# Patient Record
Sex: Female | Born: 1945 | Race: White | Hispanic: No | Marital: Married | Smoking: Current every day smoker
Health system: Southern US, Community
[De-identification: ages and names within clinical notes are randomized; demographics above are authoritative.]

## PROBLEM LIST (undated history)

## (undated) DIAGNOSIS — K219 Gastro-esophageal reflux disease without esophagitis: Secondary | ICD-10-CM

## (undated) DIAGNOSIS — D71 Functional disorders of polymorphonuclear neutrophils: Secondary | ICD-10-CM

## (undated) DIAGNOSIS — F419 Anxiety disorder, unspecified: Secondary | ICD-10-CM

## (undated) DIAGNOSIS — I5189 Other ill-defined heart diseases: Secondary | ICD-10-CM

## (undated) DIAGNOSIS — N200 Calculus of kidney: Secondary | ICD-10-CM

## (undated) DIAGNOSIS — Z8489 Family history of other specified conditions: Secondary | ICD-10-CM

## (undated) DIAGNOSIS — M1990 Unspecified osteoarthritis, unspecified site: Secondary | ICD-10-CM

## (undated) DIAGNOSIS — J410 Simple chronic bronchitis: Secondary | ICD-10-CM

## (undated) DIAGNOSIS — D719 Functional disorders of polymorphonuclear neutrophils, unspecified: Secondary | ICD-10-CM

## (undated) DIAGNOSIS — J449 Chronic obstructive pulmonary disease, unspecified: Secondary | ICD-10-CM

## (undated) DIAGNOSIS — I639 Cerebral infarction, unspecified: Secondary | ICD-10-CM

## (undated) DIAGNOSIS — H269 Unspecified cataract: Secondary | ICD-10-CM

## (undated) DIAGNOSIS — F410 Panic disorder [episodic paroxysmal anxiety] without agoraphobia: Secondary | ICD-10-CM

## (undated) DIAGNOSIS — D649 Anemia, unspecified: Secondary | ICD-10-CM

## (undated) DIAGNOSIS — I493 Ventricular premature depolarization: Secondary | ICD-10-CM

## (undated) DIAGNOSIS — M4800 Spinal stenosis, site unspecified: Secondary | ICD-10-CM

## (undated) DIAGNOSIS — J42 Unspecified chronic bronchitis: Secondary | ICD-10-CM

## (undated) DIAGNOSIS — E669 Obesity, unspecified: Secondary | ICD-10-CM

## (undated) DIAGNOSIS — N183 Chronic kidney disease, stage 3 unspecified: Secondary | ICD-10-CM

## (undated) DIAGNOSIS — I10 Essential (primary) hypertension: Secondary | ICD-10-CM

## (undated) DIAGNOSIS — R809 Proteinuria, unspecified: Secondary | ICD-10-CM

## (undated) DIAGNOSIS — E785 Hyperlipidemia, unspecified: Secondary | ICD-10-CM

## (undated) DIAGNOSIS — I2510 Atherosclerotic heart disease of native coronary artery without angina pectoris: Secondary | ICD-10-CM

## (undated) DIAGNOSIS — Z9289 Personal history of other medical treatment: Secondary | ICD-10-CM

## (undated) DIAGNOSIS — E049 Nontoxic goiter, unspecified: Secondary | ICD-10-CM

## (undated) DIAGNOSIS — Z720 Tobacco use: Secondary | ICD-10-CM

## (undated) DIAGNOSIS — R0902 Hypoxemia: Secondary | ICD-10-CM

## (undated) DIAGNOSIS — K469 Unspecified abdominal hernia without obstruction or gangrene: Secondary | ICD-10-CM

## (undated) HISTORY — PX: EYE SURGERY: SHX253

## (undated) HISTORY — DX: Functional disorders of polymorphonuclear neutrophils: D71

## (undated) HISTORY — DX: Other ill-defined heart diseases: I51.89

## (undated) HISTORY — PX: OTHER SURGICAL HISTORY: SHX169

## (undated) HISTORY — DX: Atherosclerotic heart disease of native coronary artery without angina pectoris: I25.10

## (undated) HISTORY — DX: Calculus of kidney: N20.0

## (undated) HISTORY — DX: Proteinuria, unspecified: R80.9

## (undated) HISTORY — DX: Hyperlipidemia, unspecified: E78.5

## (undated) HISTORY — DX: Unspecified abdominal hernia without obstruction or gangrene: K46.9

## (undated) HISTORY — DX: Obesity, unspecified: E66.9

## (undated) HISTORY — DX: Essential (primary) hypertension: I10

## (undated) HISTORY — DX: Unspecified osteoarthritis, unspecified site: M19.90

## (undated) HISTORY — DX: Unspecified chronic bronchitis: J42

## (undated) HISTORY — DX: Spinal stenosis, site unspecified: M48.00

## (undated) HISTORY — DX: Hypoxemia: R09.02

## (undated) HISTORY — DX: Unspecified cataract: H26.9

## (undated) HISTORY — DX: Functional disorders of polymorphonuclear neutrophils, unspecified: D71.9

## (undated) HISTORY — DX: Gastro-esophageal reflux disease without esophagitis: K21.9

## (undated) HISTORY — DX: Nontoxic goiter, unspecified: E04.9

## (undated) HISTORY — DX: Cerebral infarction, unspecified: I63.9

## (undated) HISTORY — PX: ABDOMINAL HYSTERECTOMY: SHX81

## (undated) HISTORY — PX: BREAST SURGERY: SHX581

## (undated) HISTORY — DX: Family history of other specified conditions: Z84.89

---

## 2004-06-09 ENCOUNTER — Ambulatory Visit: Payer: Self-pay | Admitting: Unknown Physician Specialty

## 2005-07-07 ENCOUNTER — Ambulatory Visit: Payer: Self-pay | Admitting: Unknown Physician Specialty

## 2005-08-03 ENCOUNTER — Ambulatory Visit: Payer: Self-pay | Admitting: Family Medicine

## 2006-02-21 HISTORY — PX: CORONARY ANGIOPLASTY WITH STENT PLACEMENT: SHX49

## 2006-03-20 ENCOUNTER — Inpatient Hospital Stay: Payer: Self-pay | Admitting: Cardiology

## 2006-03-20 ENCOUNTER — Other Ambulatory Visit: Payer: Self-pay

## 2006-03-21 ENCOUNTER — Other Ambulatory Visit: Payer: Self-pay

## 2006-07-31 ENCOUNTER — Ambulatory Visit: Payer: Self-pay | Admitting: Unknown Physician Specialty

## 2006-10-30 ENCOUNTER — Encounter: Admission: RE | Admit: 2006-10-30 | Discharge: 2006-10-30 | Payer: Self-pay | Admitting: Internal Medicine

## 2007-06-15 ENCOUNTER — Ambulatory Visit: Payer: Self-pay | Admitting: Family Medicine

## 2007-12-18 ENCOUNTER — Ambulatory Visit: Payer: Self-pay | Admitting: Unknown Physician Specialty

## 2008-12-29 ENCOUNTER — Encounter: Payer: Self-pay | Admitting: Cardiovascular Disease

## 2008-12-30 ENCOUNTER — Encounter: Payer: Self-pay | Admitting: Cardiovascular Disease

## 2009-01-07 ENCOUNTER — Ambulatory Visit: Payer: Self-pay | Admitting: Unknown Physician Specialty

## 2009-03-25 ENCOUNTER — Ambulatory Visit: Payer: Self-pay | Admitting: Cardiovascular Disease

## 2009-03-25 DIAGNOSIS — E1159 Type 2 diabetes mellitus with other circulatory complications: Secondary | ICD-10-CM | POA: Insufficient documentation

## 2009-03-25 DIAGNOSIS — I2510 Atherosclerotic heart disease of native coronary artery without angina pectoris: Secondary | ICD-10-CM | POA: Insufficient documentation

## 2009-03-25 DIAGNOSIS — E7849 Other hyperlipidemia: Secondary | ICD-10-CM | POA: Insufficient documentation

## 2009-03-25 DIAGNOSIS — E785 Hyperlipidemia, unspecified: Secondary | ICD-10-CM | POA: Insufficient documentation

## 2009-03-25 DIAGNOSIS — I10 Essential (primary) hypertension: Secondary | ICD-10-CM | POA: Insufficient documentation

## 2009-04-07 ENCOUNTER — Encounter: Payer: Self-pay | Admitting: Cardiovascular Disease

## 2009-04-09 ENCOUNTER — Encounter: Payer: Self-pay | Admitting: Cardiovascular Disease

## 2009-04-20 ENCOUNTER — Telehealth: Payer: Self-pay | Admitting: Cardiovascular Disease

## 2009-04-21 ENCOUNTER — Encounter: Payer: Self-pay | Admitting: Cardiovascular Disease

## 2009-06-30 ENCOUNTER — Ambulatory Visit: Payer: Self-pay | Admitting: Cardiovascular Disease

## 2009-08-03 ENCOUNTER — Ambulatory Visit: Payer: Self-pay | Admitting: Family Medicine

## 2010-01-05 ENCOUNTER — Ambulatory Visit: Payer: Self-pay | Admitting: Cardiovascular Disease

## 2010-01-19 ENCOUNTER — Ambulatory Visit: Payer: Self-pay | Admitting: Unknown Physician Specialty

## 2010-01-27 ENCOUNTER — Ambulatory Visit: Payer: Self-pay | Admitting: Unknown Physician Specialty

## 2010-03-23 ENCOUNTER — Telehealth: Payer: Self-pay | Admitting: Cardiovascular Disease

## 2010-03-23 NOTE — Letter (Signed)
Summary: Medical Record Release  Medical Record Release   Imported By: Harlon Flor 04/09/2009 15:28:42  _____________________________________________________________________  External Attachment:    Type:   Image     Comment:   External Document

## 2010-03-23 NOTE — Miscellaneous (Signed)
Summary: ABT for ear infection  Clinical Lists Changes  Medications: Added new medication of LEVAQUIN 500 MG TABS (LEVOFLOXACIN) 1 tab by mouth daily - Signed Rx of LEVAQUIN 500 MG TABS (LEVOFLOXACIN) 1 tab by mouth daily;  #10 x 0;  Signed;  Entered by: Charlena Cross, RN, BSN;  Authorized by: Dossie Arbour MD;  Method used: Electronically to [facility] [address]*, [address], [place], [place], [place]  [address], Ph: [phone], Fax: [phone]    Prescriptions: LEVAQUIN 500 MG TABS (LEVOFLOXACIN) 1 tab by mouth daily  #10 x 0   Entered by:   Charlena Cross, RN, BSN   Authorized by:   Dossie Arbour MD   Signed by:   Charlena Cross, RN, BSN on 04/21/2009   Method used:   Electronically to        [facility] [address]* (retail)       [address] [place], [place]  [address]       Ph: [phone]       Fax: [phone]   RxID:   [id number]

## 2010-03-23 NOTE — Progress Notes (Signed)
Summary: [facility]  [facility]   Imported By: Harlon Flor 04/07/2009 15:18:16  _____________________________________________________________________  External Attachment:    Type:   Image     Comment:   External Document

## 2010-03-23 NOTE — Assessment & Plan Note (Signed)
Summary: F6M/AMD   Visit Type:  Follow-up Primary Hannah Kirby:  Lorie Phenix, MD  CC:  Denies chest pain or shortness of breath..  History of Present Illness: [name] is a very pleasant 65 year old woman with a history of diabetes, coronary artery disease with stenting of her RCA  in January 2008, long smoking history who stopped smoking in November 2010, hyperlipidemia who presents for routine followup.  overall she reports doing well. Her blood pressure has been stable with systolic pressures in the 120s to 130s. She reports taking amlodipine 5 mg daily though she does not like to cut amlodipine and half has a thrombosed. Her glucose levels have been elevated she has been gaining weight, not exercising as she does a lot of crafts at home.  she reports having blood work with Dr. Elease Hashimoto in October of this year  No source of breath, no chest pain. No significant lower extremity edema.  EKG shows normal sinus rhythm with rate 72 beats per minute, no significant ST or T wave changes  Current Medications (verified): 1)  Lisinopril 40 Mg Tabs (Lisinopril) .... Take One Tablet By Mouth Daily 2)  Glipizide 10 Mg Tabs (Glipizide) .Marland Kitchen.. 1 By Mouth Once Daily 3)  Metformin Hcl 1000 Mg Tabs (Metformin Hcl) .Marland Kitchen.. 1 By Mouth Two Times A Day 4)  Crestor 40 Mg Tabs (Rosuvastatin Calcium) .... Take One Tablet By Mouth Daily. 5)  Omeprazole 40 Mg Cpdr (Omeprazole) .Marland Kitchen.. 1 By Mouth Once Daily Sometimes 2 As Needed 6)  Aspirin 81 Mg Tbec (Aspirin) .... Take One Tablet By Mouth Daily 7)  Fish Oil 1000 Mg Caps (Omega-3 Fatty Acids) .... 4 By Mouth Once Daily 8)  Daily Multi  Tabs (Multiple Vitamins-Minerals) .Marland Kitchen.. 1 By Mouth Once Daily 9)  Metoprolol Tartrate 50 Mg Tabs (Metoprolol Tartrate) .... Take One Tablet By Mouth Twice A Day 10)  Amlodipine Besylate 10 Mg Tabs (Amlodipine Besylate) .... 1/2 By Mouth Two Times A Day 11)  Plavix 75 Mg Tabs (Clopidogrel Bisulfate) .... Take One Tablet By Mouth  Daily 12)  Chlorhexidine Gluconate 0.12 % Soln (Chlorhexidine Gluconate) .... Swish With 1/2 Once For 30 Seconds Then Spit Two Times A Day  Allergies (verified): 1)  ! Sulfa 2)  ! Codeine  Past History:  Past Medical History: Last updated: 2009-04-05 [facility] E [facility] Hyperlipidemia Hypertension Hernia Allergies DM Arthritis 2 stents in heart  Past Surgical History: Last updated: April 05, 2009 Hysterectomy Breast Biospy  Family History: Last updated: 04-05-2009 Father: deceased 95: lung cancer Mother: deceased 87: stroke, CHF Sister: living 53: healthy  Social History: Last updated: Apr 05, 2009 Retired  Married  Tobacco Use - Former.  Alcohol Use - yes Regular Exercise - yes Drug Use - no  Risk Factors: Alcohol Use: <1 (2009/04/05) Caffeine Use: 2-3 (2009/04/05) Exercise: yes (04-05-2009)  Risk Factors: Smoking Status: quit (04-05-2009) Packs/Day: .5 (04-05-2009)  Review of Systems  The patient denies fever, weight loss, weight gain, vision loss, decreased hearing, hoarseness, chest pain, syncope, dyspnea on exertion, peripheral edema, prolonged cough, abdominal pain, incontinence, muscle weakness, depression, and enlarged lymph nodes.    Vital Signs:  Patient profile:   65 year old female Height:      65 inches Weight:      194 pounds BMI:     32.40 Pulse rate:   72 / minute BP sitting:   138 / 80  (left arm) Cuff size:   large  Vitals Entered By: Bishop Dublin, CMA (January 05, 2010 11:15 AM)  Physical Exam  General:  Well developed, well nourished, in no acute distress. Head:  normocephalic and atraumatic Neck:  Neck supple, no JVD. No masses, thyromegaly or abnormal cervical nodes. Lungs:  Clear bilaterally to auscultation and percussion. Heart:  Non-displaced PMI, chest non-tender; regular rate and rhythm, S1, S2 without murmurs, rubs or gallops. Carotid upstroke normal, no bruit.  Pedals normal pulses. No edema, no varicosities. Abdomen:  Bowel sounds  positive; abdomen soft and non-tender without masses Msk:  Back normal, normal gait. Muscle strength and tone normal. Pulses:  pulses normal in all 4 extremities Extremities:  No clubbing or cyanosis. Neurologic:  Alert and oriented x 3. Skin:  Intact without lesions or rashes. Psych:  Normal affect.   Impression & Recommendations:  Problem # 1:  HYPERTENSION, BENIGN (ICD-401.1) we'll continue on her current medication regimens.  Her updated medication list for this problem includes:    Lisinopril 40 Mg Tabs (Lisinopril) .Marland Kitchen... Take one tablet by mouth daily    Aspirin 81 Mg Tbec (Aspirin) .Marland Kitchen... Take one tablet by mouth daily    Metoprolol Tartrate 50 Mg Tabs (Metoprolol tartrate) .Marland Kitchen... Take one tablet by mouth twice a day    Amlodipine Besylate 5 Mg Tabs (Amlodipine besylate) .Marland Kitchen... Take 1 tablet by mouth once a day  Problem # 2:  CAD (ICD-414.00) No angina, chest pain or shortness of breath at this time. She's not very active. We'll continue her on her current medication regimen. We have encouraged her to increase her exercise in an effort to decrease her sugars, decrease her weight and her cholesterol.  Her updated medication list for this problem includes:    Lisinopril 40 Mg Tabs (Lisinopril) .Marland Kitchen... Take one tablet by mouth daily    Aspirin 81 Mg Tbec (Aspirin) .Marland Kitchen... Take one tablet by mouth daily    Metoprolol Tartrate 50 Mg Tabs (Metoprolol tartrate) .Marland Kitchen... Take one tablet by mouth twice a day    Amlodipine Besylate 5 Mg Tabs (Amlodipine besylate) .Marland Kitchen... Take 1 tablet by mouth two times a day    Plavix 75 Mg Tabs (Clopidogrel bisulfate) .Marland Kitchen... Take one tablet by mouth daily  Problem # 3:  HYPERLIPIDEMIA (ICD-272.4) Goal LDL is less than 70. We will try to obtain the most recent lipid panel from Dr. Elease Hashimoto. we have encouraged weight loss.  Her updated medication list for this problem includes:    Crestor 40 Mg Tabs (Rosuvastatin calcium) .Marland Kitchen... Take one tablet by mouth  daily.  Patient Instructions: 1)  Your physician recommends that you schedule a follow-up appointment in: 1 year. 2)  Your physician recommends that you continue on your current medications as directed. Please refer to the Current Medication list given to you today. A new prescription for Amlodipine 5mg  tablets once daily has been sent to [facility] per your request.  Prescriptions: AMLODIPINE BESYLATE 5 MG TABS (AMLODIPINE BESYLATE) Take 1 tablet by mouth two times a day  #60 x 6   Entered by:   Cloyde Reams RN   Authorized by:   Dossie Arbour MD   Signed by:   Cloyde Reams RN on 01/05/2010   Method used:   Electronically to        [facility] [address] (retail)       [address] [place], [place]  [address]       Ph: [phone]       Fax: [phone]  RxID:   [id number] AMLODIPINE BESYLATE 5 MG TABS (AMLODIPINE BESYLATE) Take 1 tablet by mouth once a day  #30 x 6   Entered by:   Cloyde Reams RN   Authorized by:   Dossie Arbour MD   Signed by:   Cloyde Reams RN on 01/05/2010   Method used:   Electronically to        [facility] [address] (retail)       [address] [place], [place]  [address]       Ph: [phone]       Fax: [phone]   RxID:   [id number]   Appended Document: F6M/AMD Called spoke with pt, she has been taking 5mg  once daily because twice daily still made her feel drained.  Corrected on medication list. Cloyde Reams RN  January 05, 2010 3:10 PM    Clinical Lists Changes  Medications: Changed medication from AMLODIPINE BESYLATE 5 MG TABS (AMLODIPINE BESYLATE) Take 1 tablet by mouth two times a day to AMLODIPINE BESYLATE 5 MG TABS (AMLODIPINE BESYLATE) Take 1 tablet by mouth once daily

## 2010-03-23 NOTE — Progress Notes (Signed)
Summary: CALL BACK  Phone Note Call from Patient Call back at Home Phone [phone]   Caller: SELF Call For: GOLLAN Summary of Call: WOULD LIKE TO SPEAK TO GOLLAN ABOUT SOME HEADACHES SHE WAS HAVING-THIS WAS SOMETHING THAT WAS DISCUSSED AT LAST VISIT Initial call taken by: Harlon Flor,  April 20, 2009 9:19 AM  Follow-up for Phone Call        Patient with worsening otitis media: ear pain, popping, fluid in ear with pressure. Failed augmentin. She is unable to get treatment from PMD due to PMD "coming under the [facility] insurance plan, later this month due to go through".  Will cal in levaquin 500 mg by mouth once daily for 10 days.

## 2010-03-23 NOTE — Assessment & Plan Note (Signed)
Summary: F3M/AMD   Visit Type:  Follow-up Primary Provider:  Lorie Phenix, MD  CC:  some issue with pains starting in shoulder running up to neck on right side ?muscle problems. No sob and no swelling.Marland Kitchen  History of Present Illness: [name] is a very pleasant 65 year old woman with a history of diabetes, coronary artery disease with stenting of her RCA and sees 2 stents) in January 2008, long smoking history who stopped smoking in November 2010, hyperlipidemia who presents for evaluation of her blood pressure.  [name] states that overall she is doing well. Her sinus infection resolved with Levaquin. She has been taking amlodipine 5 mg t.i.d. and this has improved her blood pressure. Typically her systolics run in the 120s on the medication. Otherwise she states her sugars have been well-controlled. She does not get much exercise. She denies any other symptoms and states she feels well. No chest pain, no significant shortness of breath.  Current Problems (verified): 1)  Long Term Antiplatelet Therapy  (ICD-V58.63) 2)  Hypertension, Benign  (ICD-401.1) 3)  Cad  (ICD-414.00) 4)  Hyperlipidemia  (ICD-272.4)  Current Medications (verified): 1)  Lisinopril 40 Mg Tabs (Lisinopril) .... Take One Tablet By Mouth Daily 2)  Glipizide 10 Mg Tabs (Glipizide) .Marland Kitchen.. 1 By Mouth Once Daily 3)  Metformin Hcl 1000 Mg Tabs (Metformin Hcl) .Marland Kitchen.. 1 By Mouth Two Times A Day 4)  Crestor 40 Mg Tabs (Rosuvastatin Calcium) .... Take One Tablet By Mouth Daily. 5)  Omeprazole 40 Mg Cpdr (Omeprazole) .Marland Kitchen.. 1 By Mouth Once Daily Sometimes 2 As Needed 6)  Aspirin 81 Mg Tbec (Aspirin) .... Take One Tablet By Mouth Daily 7)  Fish Oil 1000 Mg Caps (Omega-3 Fatty Acids) .... 4 By Mouth Once Daily 8)  Daily Multi  Tabs (Multiple Vitamins-Minerals) .Marland Kitchen.. 1 By Mouth Once Daily 9)  Metoprolol Tartrate 50 Mg Tabs (Metoprolol Tartrate) .... Take One Tablet By Mouth Twice A Day 10)  Amlodipine Besylate 10 Mg Tabs  (Amlodipine Besylate) .... 1/2 By Mouth Two Times A Day 11)  Plavix 75 Mg Tabs (Clopidogrel Bisulfate) .... Take One Tablet By Mouth Daily  Allergies (verified): 1)  ! Sulfa 2)  ! Codeine  Past History:  Past Medical History: Last updated: 22-Apr-2009 G E R D Hyperlipidemia Hypertension Hernia Allergies DM Arthritis 2 stents in heart  Past Surgical History: Last updated: 22-Apr-2009 Hysterectomy Breast Biospy  Family History: Last updated: 2009-04-22 Father: deceased 86: lung cancer Mother: deceased 69: stroke, CHF Sister: living 63: healthy  Social History: Last updated: 04-22-2009 Retired  Married  Tobacco Use - Former.  Alcohol Use - yes Regular Exercise - yes Drug Use - no  Risk Factors: Alcohol Use: <1 (04/22/2009) Caffeine Use: 2-3 (04-22-2009) Exercise: yes (2009/04/22)  Risk Factors: Smoking Status: quit (04/22/09) Packs/Day: .5 (Apr 22, 2009)  Review of Systems  The patient denies fever, weight loss, weight gain, vision loss, decreased hearing, hoarseness, chest pain, syncope, dyspnea on exertion, peripheral edema, prolonged cough, abdominal pain, incontinence, muscle weakness, depression, and enlarged lymph nodes.    Vital Signs:  Patient profile:   65 year old female Height:      65 inches Weight:      193.50 pounds BMI:     32.32 Pulse rate:   68 / minute Pulse rhythm:   regular BP sitting:   120 / 78  (right arm) Cuff size:   large  Vitals Entered By: Mercer Pod (Jun 30, 2009 3:49 PM)  Physical Exam  General:  well-appearing middle-aged woman in no apparent distress, HEENT exam is benign, all thanks is clear, neck is supple no JVP or carotid bruits, heart sounds are regular with S1-S2 and no murmurs appreciated, lungs are clear to auscultation with no wheezes Rales, abdominal exam is benign, no significant lower extremity edema, neurologic exam is grossly nonfocal, skin is warm and dry. Pulses are equal and symmetrical in her  upper and lower extremities.   Impression & Recommendations:  Problem # 1:  CAD (ICD-414.00) negative stress test in December 2010. History of prior coronary artery disease. She is a nonsmoker, on aspirin and Plavix.  I've encouraged her to increase her exercise with aerobic activity several times per week.  The following medications were removed from the medication list:    Effient 10 Mg Tabs (Prasugrel hcl) .Marland Kitchen... Take one tablet by mouth daily    Bystolic 20 Mg Tabs (Nebivolol hcl) .Marland Kitchen... 1 by mouth once daily Her updated medication list for this problem includes:    Lisinopril 40 Mg Tabs (Lisinopril) .Marland Kitchen... Take one tablet by mouth daily    Aspirin 81 Mg Tbec (Aspirin) .Marland Kitchen... Take one tablet by mouth daily    Metoprolol Tartrate 50 Mg Tabs (Metoprolol tartrate) .Marland Kitchen... Take one tablet by mouth twice a day    Amlodipine Besylate 10 Mg Tabs (Amlodipine besylate) .Marland Kitchen... 1/2 by mouth two times a day    Plavix 75 Mg Tabs (Clopidogrel bisulfate) .Marland Kitchen... Take one tablet by mouth daily  Problem # 2:  HYPERTENSION, BENIGN (ICD-401.1) Blood pressure is significantly improved on her current medication regimen and. We have made no changes at this time.  The following medications were removed from the medication list:    Bystolic 20 Mg Tabs (Nebivolol hcl) .Marland Kitchen... 1 by mouth once daily Her updated medication list for this problem includes:    Lisinopril 40 Mg Tabs (Lisinopril) .Marland Kitchen... Take one tablet by mouth daily    Aspirin 81 Mg Tbec (Aspirin) .Marland Kitchen... Take one tablet by mouth daily    Metoprolol Tartrate 50 Mg Tabs (Metoprolol tartrate) .Marland Kitchen... Take one tablet by mouth twice a day    Amlodipine Besylate 10 Mg Tabs (Amlodipine besylate) .Marland Kitchen... 1/2 by mouth two times a day  Problem # 3:  HYPERLIPIDEMIA (ICD-272.4) Cholesterol and LDL are slightly above goal with cholesterol of 160, LDL of 79, HDL 51. Have encouraged her to decrease her weight by several pounds as this will help her diabetes. Her hemoglobin A1c  she reports is slightly over 7. Weight loss of at least 5 pounds will help with her cholesterol and her diabetes.  Her updated medication list for this problem includes:    Crestor 40 Mg Tabs (Rosuvastatin calcium) .Marland Kitchen... Take one tablet by mouth daily. Prescriptions: CHLORHEXIDINE GLUCONATE 0.12 % SOLN (CHLORHEXIDINE GLUCONATE) Swish with 1/2 once for 30 seconds then spit two times a day  #473 x 11   Entered by:   Cloyde Reams RN   Authorized by:   Dossie Arbour MD   Signed by:   Cloyde Reams RN on 06/30/2009   Method used:   Electronically to        [facility] [address]* (retail)       [address] [place], [place]  [address]       Ph: [phone]       Fax: [phone]   RxID:   [id number]

## 2010-03-23 NOTE — Assessment & Plan Note (Signed)
Summary: NP6/AMD   Visit Type:  New Patient Primary Provider:  Lorie Phenix, MD  CC:  no complaints.  History of Present Illness: [name] is a very pleasant 65 year old woman with a history of diabetes, coronary artery disease with stenting of her RCA and sees 2 stents) in January 2008, long smoking history who stopped smoking in November 2010, hyperlipidemia who presents for evaluation of her blood pressure.  [name]'s states that she has had a recent sinus infection. She was seen by Dr. Elease Hashimoto last Friday and started on antibiotics. She is currently completed 6 days of antibiotics. She states that she's had headaches for over a week which she attributes to a sinus infection her blood pressures and very elevated. She was having systolics even up to 200. Otherwise she denies any significant shortness of breath or chest pain. She's tried over-the-counter sinus medications though she wonders if this may be increasing her blood pressure.  Preventive Screening-Counseling & Management  Alcohol-Tobacco     Alcohol drinks/day: <1     Smoking Status: quit     Packs/Day: .5     Year Quit: 2010  Caffeine-Diet-Exercise     Caffeine use/day: 2-3     Does Patient Exercise: yes     Type of exercise: walk      Drug Use:  no.    Current Medications (verified): 1)  Plavix 75 Mg Tabs (Clopidogrel Bisulfate) .... Take One Tablet By Mouth Daily 2)  Lisinopril 40 Mg Tabs (Lisinopril) .... Take One Tablet By Mouth Daily 3)  Glipizide 10 Mg Tabs (Glipizide) .Marland Kitchen.. 1 By Mouth Once Daily 4)  Metformin Hcl 1000 Mg Tabs (Metformin Hcl) .Marland Kitchen.. 1 By Mouth Two Times A Day 5)  Bystolic 20 Mg Tabs (Nebivolol Hcl) .Marland Kitchen.. 1 By Mouth Once Daily 6)  Crestor 20 Mg Tabs (Rosuvastatin Calcium) .... Take One Tablet By Mouth Daily. 7)  Omeprazole 40 Mg Cpdr (Omeprazole) .Marland Kitchen.. 1 By Mouth Once Daily Sometimes 2 As Needed 8)  Aspirin 81 Mg Tbec (Aspirin) .... Take One Tablet By Mouth Daily 9)  Fish Oil 1000 Mg  Caps (Omega-3 Fatty Acids) .... 4 By Mouth Once Daily 10)  Daily Multi  Tabs (Multiple Vitamins-Minerals) .Marland Kitchen.. 1 By Mouth Once Daily  Allergies (verified): 1)  ! Sulfa 2)  ! Codeine  Past History:  Past Medical History: [facility] Hyperlipidemia Hypertension Hernia Allergies DM Arthritis 2 stents in heart  Past Surgical History: Hysterectomy Breast Biospy  Family History: Father: deceased 59: lung cancer Mother: deceased 43: stroke, CHF Sister: living 37: healthy  Social History: Retired  Married  Tobacco Use - Former.  Alcohol Use - yes Regular Exercise - yes Drug Use - no Alcohol drinks/day:  <1 Smoking Status:  quit Packs/Day:  .5 Caffeine use/day:  2-3 Does Patient Exercise:  yes Drug Use:  no  Review of Systems       Sinus congestion, headache  Vital Signs:  Patient profile:   65 year old female Height:      65 inches Weight:      191.50 pounds BMI:     31.98 Pulse rate:   70 / minute Pulse rhythm:   regular BP sitting:   172 / 92  (left arm) Cuff size:   large  Vitals Entered By: Mercer Pod (March 25, 2009 3:19 PM)  Physical Exam  General:  HEENT exam is benign, oropharynx is clear, neck is supple with no JVP or carotid bruits. Heart sounds  are regular with normal S1 and S2 with no murmurs appreciated. Lungs are clear to auscultation with no wheezes or rales. Abdominal exam is benign and she has no significant lower extremity edema. Neurologic exam is grossly nonfocal and skin is warm and dry. Pulses are equal and symmetrical in her upper and lower extremities.   New Orders:     1)  T-Lipid Profile ([id number])     2)  T-Comprehensive Metabolic Panel ([id number])     3)  EKG w/ Interpretation (93000)   Impression & Recommendations:  Problem # 1:  HYPERTENSION, BENIGN (ICD-401.1) blood pressure is elevated today. Her blood pressure was also elevated on her last clinic visit in November 2000 and the office in [facility]. We will start her on amlodipine 10 mg daily but suggested that she could try to dose herself 5 mg p.o. b.i.d. She states that the cost of diastolic is extensive and we will change her to metoprolol tartrate 50 mg p.o. b.i.d.I have asked her to monitor her blood pressure at home and to call  us if the systolic pressure continues to be greater than 150. Her updated medication list for this problem includes:    Lisinopril 40 Mg Tabs (Lisinopril) .Marland Kitchen... Take one tablet by mouth daily    Bystolic 20 Mg Tabs (Nebivolol hcl) .Marland Kitchen... 1 by mouth once daily    Aspirin 81 Mg Tbec (Aspirin) .Marland Kitchen... Take one tablet by mouth daily    Metoprolol Tartrate 50 Mg Tabs (Metoprolol tartrate) .Marland Kitchen... Take one tablet by mouth twice a day    Amlodipine Besylate 10 Mg Tabs (Amlodipine besylate) .Marland Kitchen... Take one tablet by mouth daily  Problem # 2:  CAD (ICD-414.00) Ms. [name] had a stress test in April of 2008. We will discuss with her whether to repeat a stress test on her next visit. I am delighted that she has stopped smoking which occurred in November of 2010.we will check her cholesterol, LFTs and other routine labs today's visit. She does have a history of transaminitis and her course for lab checks in September and early January have not been completed. She will do this this week. 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 Her updated medication list for this problem includes:    Effient 10 Mg Tabs (Prasugrel hcl) .Marland Kitchen... Take one tablet by mouth daily    Lisinopril 40 Mg Tabs (Lisinopril) .Marland Kitchen... Take one tablet by mouth daily    Bystolic 20 Mg Tabs (Nebivolol hcl) .Marland Kitchen... 1 by mouth once daily    Aspirin 81 Mg Tbec (Aspirin) .Marland Kitchen... Take one tablet by mouth daily    Metoprolol Tartrate 50 Mg Tabs (Metoprolol tartrate) .Marland Kitchen... Take one tablet by mouth twice a day    Amlodipine Besylate 10 Mg Tabs  (Amlodipine besylate) .Marland Kitchen... Take one tablet by mouth daily  Orders: EKG w/ Interpretation (93000)  Problem # 3:  LONG TERM ANTIPLATELET THERAPY (ICD-V58.63) we did to her about her Plavix and omeprazole. There are some studies that suggest that the omeprazole can inhibit the efficacy of Plavix. As the cost of Plavix as likely the same as the cost of effient, we will write a new prescription for effient 10 mg daily.  Problem # 4:  HYPERLIPIDEMIA (ICD-272.4) [name]'s currently takes Crestor 40 mg q.h.s. She is due have a cholesterol check this week and we will forward this to Dr.  Lorie Phenix as soon as it becomes available.if her LFTs are significantly elevated above her previous study, we may have to reconsider using a statin or at least drop the dose. Her updated medication list for this problem includes:    Crestor 20 Mg Tabs (Rosuvastatin calcium) .Marland Kitchen... Take one tablet by mouth daily.  Orders: T-Lipid Profile ([id number]) T-Comprehensive Metabolic Panel [id number])  Patient Instructions: 1)  Your physician recommends that you schedule a follow-up appointment in: 3 months 2)  Your physician recommends that you return for a FASTING lipid profile: as soon as possible at [facility] (cmet/lipids) 3)  Your physician has recommended you make the following change in your medication: stop plavix, start effient 10 mg daily, metoprolol 50 mg twice a day, amlopidine 10 mg daily Prescriptions: EFFIENT 10 MG TABS (PRASUGREL HCL) Take one tablet by mouth daily  #30 x 6   Entered by:   Charlena Cross, RN, BSN   Authorized by:   Dossie Arbour MD   Signed by:   Charlena Cross, RN, BSN on 03/25/2009   Method used:   Electronically to        [id number] * (retail)       [address]       [place], [place]  [address]       Ph: [phone] or [phone]       Fax: [phone]   RxID:   [id number] AMLODIPINE BESYLATE 10 MG TABS (AMLODIPINE BESYLATE) Take  one tablet by mouth daily  #30 x 6   Entered by:   Charlena Cross, RN, BSN   Authorized by:   Dossie Arbour MD   Signed by:   Charlena Cross, RN, BSN on 03/25/2009   Method used:   Electronically to        [id number] * (retail)       [address]       [place], [place]  [address]       Ph: [phone] or [phone]       Fax: [phone]   RxID:   [id number] METOPROLOL TARTRATE 50 MG TABS (METOPROLOL TARTRATE) Take one tablet by mouth twice a day  #60 x 6   Entered by:   Charlena Cross, RN, BSN   Authorized by:   Dossie Arbour MD   Signed by:   Charlena Cross, RN, BSN on 03/25/2009   Method used:   Electronically to        [id number] (retail)       [address]       [place], [place]  [address]       Ph: [phone] or [phone]       Fax: [phone]   RxID:   [id number]

## 2010-03-23 NOTE — Progress Notes (Signed)
Summary: Office Visit  Office Visit   Imported By: Harlon Flor 03/27/2009 10:50:16  _____________________________________________________________________  External Attachment:    Type:   Image     Comment:   External Document

## 2010-03-31 NOTE — Progress Notes (Signed)
Summary: Excuse for jury duty  Phone Note Call from Patient Call back at Home Phone [phone] Call back at [phone]   Caller: Self Call For: Gollan Summary of Call: Pt would like a letter to excuse her from jury duty on 2/14.  Needs the letter asap. Initial call taken by: Harlon Flor,  March 23, 2010 10:20 AM  Follow-up for Phone Call        Spoke to pt after talking Dr. Mariah Milling. Notified pt with her hx there is no medical reason that she could not go to jury duty 04/06/10 per our records. Follow-up by: Lanny Hurst RN,  March 23, 2010 11:55 AM

## 2010-04-12 ENCOUNTER — Telehealth: Payer: Self-pay | Admitting: Cardiovascular Disease

## 2010-04-12 ENCOUNTER — Encounter: Payer: Self-pay | Admitting: Cardiovascular Disease

## 2010-04-20 NOTE — Miscellaneous (Signed)
  Clinical Lists Changes  Medications: Added new medication of NITROSTAT 0.4 MG SUBL (NITROGLYCERIN) 1 tablet by mouth every 5 minutes as needed, do exceed 3 doses - Signed Rx of NITROSTAT 0.4 MG SUBL (NITROGLYCERIN) 1 tablet by mouth every 5 minutes as needed, do exceed 3 doses;  #25 x 3;  Signed;  Entered by: Lysbeth Galas CMA;  Authorized by: Dossie Arbour MD;  Method used: Electronically to [facility] [address] [place], [place], [place]  [address], Ph: [phone], Fax: [phone]    Prescriptions: NITROSTAT 0.4 MG SUBL (NITROGLYCERIN) 1 tablet by mouth every 5 minutes as needed, do exceed 3 doses  #25 x 3   Entered by:   Lysbeth Galas CMA   Authorized by:   Dossie Arbour MD   Signed by:   Lysbeth Galas CMA on 04/12/2010   Method used:   Electronically to        [facility] [address]* (retail)       [address] [place], [place]  [address]       Ph: [phone]       Fax: [phone]   RxID:   [id number]

## 2010-04-20 NOTE — Progress Notes (Signed)
Summary: NTG  Phone Note Call from Patient Call back at Home Phone [phone]   Caller: Patient Call For: Hannah Kirby Summary of Call: Pt is calling for refill on her nitro, not in medication list. Okay to refill? Initial call taken by: Lysbeth Galas CMA,  April 12, 2010 11:42 AM  Follow-up for Phone Call        ok to refill. If she is using alot, call and let me know     Appended Document: NTG pt notified and rx called into pharmacy/sab

## 2010-08-05 ENCOUNTER — Encounter: Payer: Self-pay | Admitting: Cardiovascular Disease

## 2010-08-18 ENCOUNTER — Encounter: Payer: Self-pay | Admitting: Cardiovascular Disease

## 2010-08-18 ENCOUNTER — Other Ambulatory Visit: Payer: Self-pay | Admitting: Emergency Medicine

## 2010-08-18 ENCOUNTER — Ambulatory Visit (INDEPENDENT_AMBULATORY_CARE_PROVIDER_SITE_OTHER): Payer: PRIVATE HEALTH INSURANCE | Admitting: Cardiovascular Disease

## 2010-08-18 DIAGNOSIS — E785 Hyperlipidemia, unspecified: Secondary | ICD-10-CM

## 2010-08-18 DIAGNOSIS — I2510 Atherosclerotic heart disease of native coronary artery without angina pectoris: Secondary | ICD-10-CM

## 2010-08-18 DIAGNOSIS — I10 Essential (primary) hypertension: Secondary | ICD-10-CM

## 2010-08-18 DIAGNOSIS — R609 Edema, unspecified: Secondary | ICD-10-CM

## 2010-08-18 MED ORDER — AMLODIPINE BESYLATE 5 MG PO TABS: 5.0000 mg | ORAL_TABLET | Freq: Every day | ORAL | Status: DC

## 2010-08-18 MED ORDER — CHLORHEXIDINE GLUCONATE 0.12 % MT SOLN: 15.0000 mL | Freq: Two times a day (BID) | OROMUCOSAL | Status: DC

## 2010-08-18 NOTE — Assessment & Plan Note (Signed)
Currently with no symptoms of angina. No further workup at this time. Continue current medication regimen. 

## 2010-08-18 NOTE — Assessment & Plan Note (Signed)
Edema has resolved today. It is likely dependent edema from venous insufficiency. We did suggest that these long car trips, she could wear TED hose, even hold her amlodipine On long trips as this could make her edema worse. She could even try Ace wraps.

## 2010-08-18 NOTE — Assessment & Plan Note (Signed)
His chest she stay on her Crestor. We'll try to obtain her most recent labs from Dr. Elease Hashimoto. Goal LDL less than 70

## 2010-08-18 NOTE — Assessment & Plan Note (Signed)
Blood pressure is well controlled on today's visit. No changes made to the medications. 

## 2010-08-18 NOTE — Patient Instructions (Signed)
You are doing well. No medication changes were made. Please call us if you have new issues that need to be addressed before your next appt.  We will call you for a follow up Appt. In 6 months  

## 2010-08-18 NOTE — Progress Notes (Signed)
   Patient ID: Hannah Kirby, female    DOB: November 25, 1945, 66 y.o.   MRN: [id number]  HPI Comments: Ms. Soundra Lampley is a very pleasant 65 year old woman with a history of diabetes, coronary artery disease with stenting of her RCA  in January 2008, long smoking history who stopped smoking in November 2010, hyperlipidemia who presents for routine followup.   She has been doing well. She does report having some recent leg swelling when she travels. She has taken long trips such as to [place] and back as well as to the beach and she has significant ankle edema when she travels. She has tried leg elevation. She does not wear TED hose. She denies any significant shortness of breath or chest discomfort.   Old EKG shows normal sinus rhythm with rate 72 beats per minute, no significant ST or T wave changes        Review of Systems  Constitutional: Negative.   HENT: Negative.   Eyes: Negative.   Respiratory: Negative.   Cardiovascular: Positive for leg swelling.       Currently today, edema has improved  Gastrointestinal: Negative.   Musculoskeletal: Negative.   Skin: Negative.   Neurological: Negative.   Hematological: Negative.   Psychiatric/Behavioral: Negative.   All other systems reviewed and are negative.    BP 115/73  Pulse 79  Ht 5\' 6"  (1.676 m)  Wt 188 lb (85.276 kg)  BMI 30.34 kg/m2   Physical Exam  Nursing note and vitals reviewed. Constitutional: She is oriented to person, place, and time. She appears well-developed and well-nourished.  HENT:  Head: Normocephalic.  Nose: Nose normal.  Mouth/Throat: Oropharynx is clear and moist.  Eyes: Conjunctivae are normal. Pupils are equal, round, and reactive to light.  Neck: Normal range of motion. Neck supple. No JVD present.  Cardiovascular: Normal rate, regular rhythm, S1 normal, S2 normal, normal heart sounds and intact distal pulses.  Exam reveals no gallop and no friction rub.   No murmur heard. Pulmonary/Chest: Effort  normal and breath sounds normal. No respiratory distress. She has no wheezes. She has no rales. She exhibits no tenderness.  Abdominal: Soft. Bowel sounds are normal. She exhibits no distension. There is no tenderness.  Musculoskeletal: Normal range of motion. She exhibits no edema and no tenderness.  Lymphadenopathy:    She has no cervical adenopathy.  Neurological: She is alert and oriented to person, place, and time. Coordination normal.  Skin: Skin is warm and dry. No rash noted. No erythema.  Psychiatric: She has a normal mood and affect. Her behavior is normal. Judgment and thought content normal.         Assessment and Plan

## 2010-09-14 ENCOUNTER — Other Ambulatory Visit: Payer: Self-pay | Admitting: *Deleted

## 2010-09-14 MED ORDER — AMLODIPINE BESYLATE 5 MG PO TABS: 5.0000 mg | ORAL_TABLET | Freq: Every day | ORAL | Status: DC

## 2011-02-22 DIAGNOSIS — N200 Calculus of kidney: Secondary | ICD-10-CM

## 2011-02-22 HISTORY — DX: Calculus of kidney: N20.0

## 2011-03-01 ENCOUNTER — Ambulatory Visit: Payer: PRIVATE HEALTH INSURANCE | Admitting: Cardiovascular Disease

## 2011-03-09 ENCOUNTER — Ambulatory Visit: Payer: PRIVATE HEALTH INSURANCE | Admitting: Cardiovascular Disease

## 2011-03-21 ENCOUNTER — Ambulatory Visit (INDEPENDENT_AMBULATORY_CARE_PROVIDER_SITE_OTHER): Payer: PRIVATE HEALTH INSURANCE | Admitting: Cardiovascular Disease

## 2011-03-21 ENCOUNTER — Encounter: Payer: Self-pay | Admitting: Cardiovascular Disease

## 2011-03-21 VITALS — BP 160/92 | HR 67 | Ht 65.0 in | Wt 187.8 lb

## 2011-03-21 DIAGNOSIS — I10 Essential (primary) hypertension: Secondary | ICD-10-CM

## 2011-03-21 DIAGNOSIS — E119 Type 2 diabetes mellitus without complications: Secondary | ICD-10-CM

## 2011-03-21 DIAGNOSIS — E669 Obesity, unspecified: Secondary | ICD-10-CM | POA: Insufficient documentation

## 2011-03-21 DIAGNOSIS — E785 Hyperlipidemia, unspecified: Secondary | ICD-10-CM

## 2011-03-21 DIAGNOSIS — R609 Edema, unspecified: Secondary | ICD-10-CM

## 2011-03-21 DIAGNOSIS — F17200 Nicotine dependence, unspecified, uncomplicated: Secondary | ICD-10-CM

## 2011-03-21 DIAGNOSIS — F17210 Nicotine dependence, cigarettes, uncomplicated: Secondary | ICD-10-CM | POA: Insufficient documentation

## 2011-03-21 DIAGNOSIS — I2510 Atherosclerotic heart disease of native coronary artery without angina pectoris: Secondary | ICD-10-CM

## 2011-03-21 NOTE — Assessment & Plan Note (Signed)
Edema improved with lower dose amlodipine. Likely secondary to venous insufficiency.

## 2011-03-21 NOTE — Assessment & Plan Note (Signed)
Cholesterol is mildly above goal on  Labs from May 2012. Discharge improved with better diabetes control. We did offer that we could start zetia 10 mg daily. She was not interested at this time.

## 2011-03-21 NOTE — Patient Instructions (Signed)
You are doing well. No medication changes were made.  Please call us if you have new issues that need to be addressed before your next appt.  Your physician wants you to follow-up in: 6 months.  You will receive a reminder letter in the mail two months in advance. If you don't receive a letter, please call our office to schedule the follow-up appointment.   

## 2011-03-21 NOTE — Assessment & Plan Note (Signed)
Currently with no symptoms of angina. No further workup at this time. Continue current medication regimen. 

## 2011-03-21 NOTE — Assessment & Plan Note (Signed)
>>  ASSESSMENT AND PLAN FOR TOBACCO USE DISORDER WRITTEN ON 03/21/2011  7:34 PM BY Antonieta Iba, MD  We have encouraged her to continue to work on weaning her cigarettes and smoking cessation. She will continue to work on this and does not want any assistance with chantix.

## 2011-03-21 NOTE — Assessment & Plan Note (Addendum)
We have asked her to measure her blood pressures at home and call office with some measurements.  We will not increase the calcium channel blocker given her previous lower extremity edema. She could perhaps try HCTZ, or clonidine, or long-acting nitrates.

## 2011-03-21 NOTE — Assessment & Plan Note (Signed)
We have encouraged her to continue to work on weaning her cigarettes and smoking cessation. She will continue to work on this and does not want any assistance with chantix.  

## 2011-03-21 NOTE — Progress Notes (Signed)
Patient ID: Hannah Kirby, female    DOB: [birth year], 66 y.o.   MRN: [id number]  HPI Comments: Hannah Kirby is a  66 year old woman with a history of diabetes (recent hemoglobin A1c greater than 8), coronary artery disease with stenting of her RCA  in January 2008, long smoking history who continues to smoke, hyperlipidemia who presents for routine followup.   She has been doing well.  She denies any significant shortness of breath or chest discomfort. Lower extremity edema has improved with low-dose amlodipine. She has been trying to watch her diet as her hemoglobin A1c has been high. She has not noticed a significant change in her weight. She is trying to decrease her carbs and finally a very frustrating. Her husband is also battling weight. They have not been exercising but they do use a game station in their house.  She reports that her blood pressure has recently been high.   Old EKG shows normal sinus rhythm with rate 65 beats per minute, no significant ST or T wave changes      Outpatient Encounter Prescriptions as of 03/21/2011  Medication Sig Dispense Refill  . amLODipine (NORVASC) 5 MG tablet Take 1 tablet (5 mg total) by mouth daily.  30 tablet  6  . aspirin (ASPIR-81) 81 MG EC tablet Take 81 mg by mouth daily.        . calcium carbonate (OS-CAL) 600 MG TABS Take 600 mg by mouth 2 (two) times daily with a meal.        . clopidogrel (PLAVIX) 75 MG tablet Take 75 mg by mouth daily.        Marland Kitchen glipiZIDE (GLUCOTROL) 10 MG tablet Take 10 mg by mouth daily.        Marland Kitchen lisinopril (PRINIVIL,ZESTRIL) 40 MG tablet Take 40 mg by mouth daily.        . metFORMIN (GLUMETZA) 1000 MG (MOD) 24 hr tablet Take 1,000 mg by mouth 2 (two) times daily.        . Multiple Vitamins-Minerals (DAILY MULTI) TABS Take by mouth daily.        . nitroGLYCERIN (NITROSTAT) 0.4 MG SL tablet Place 0.4 mg under the tongue every 5 (five) minutes as needed.        . Omega-3 Fatty Acids (FISH OIL) 1000 MG CAPS Take by  mouth daily. Take 4       . omeprazole (PRILOSEC) 40 MG capsule Take 40 mg by mouth daily. Take extra cap if more heartburn than ususal      . rosuvastatin (CRESTOR) 40 MG tablet Take 40 mg by mouth daily.        . chlorhexidine (PERIDEX) 0.12 % solution Use as directed 15 mLs in the mouth or throat 2 (two) times daily.  120 mL  6  . metoprolol (LOPRESSOR) 50 MG tablet Take 50 mg by mouth 2 (two) times daily.           Review of Systems  Constitutional: Negative.   HENT: Negative.   Eyes: Negative.   Respiratory: Negative.   Cardiovascular:       Currently today, edema has improved  Gastrointestinal: Negative.   Musculoskeletal: Negative.   Skin: Negative.   Neurological: Negative.   Hematological: Negative.   Psychiatric/Behavioral: Negative.   All other systems reviewed and are negative.    BP 160/92  Pulse 67  Ht 5\' 5"  (1.651 m)  Wt 187 lb 12.8 oz (85.186 kg)  BMI 31.25 kg/m2   Physical Exam  Nursing note and vitals reviewed. Constitutional: She is oriented to person, place, and time. She appears well-developed and well-nourished.  HENT:  Head: Normocephalic.  Nose: Nose normal.  Mouth/Throat: Oropharynx is clear and moist.  Eyes: Conjunctivae are normal. Pupils are equal, round, and reactive to light.  Neck: Normal range of motion. Neck supple. No JVD present.  Cardiovascular: Normal rate, regular rhythm, S1 normal, S2 normal, normal heart sounds and intact distal pulses.  Exam reveals no gallop and no friction rub.   No murmur heard. Pulmonary/Chest: Effort normal and breath sounds normal. No respiratory distress. She has no wheezes. She has no rales. She exhibits no tenderness.  Abdominal: Soft. Bowel sounds are normal. She exhibits no distension. There is no tenderness.  Musculoskeletal: Normal range of motion. She exhibits no edema and no tenderness.  Lymphadenopathy:    She has no cervical adenopathy.  Neurological: She is alert and oriented to person, place,  and time. Coordination normal.  Skin: Skin is warm and dry. No rash noted. No erythema.  Psychiatric: She has a normal mood and affect. Her behavior is normal. Judgment and thought content normal.         Assessment and Plan

## 2011-03-21 NOTE — Assessment & Plan Note (Signed)
We have encouraged continued exercise, careful diet management in an effort to lose weight. 

## 2011-04-01 ENCOUNTER — Telehealth: Payer: Self-pay

## 2011-04-01 ENCOUNTER — Other Ambulatory Visit: Payer: Self-pay | Admitting: Cardiovascular Disease

## 2011-04-01 MED ORDER — AMLODIPINE BESYLATE 5 MG PO TABS: 5.0000 mg | ORAL_TABLET | Freq: Every day | ORAL | Status: DC

## 2011-04-01 NOTE — Telephone Encounter (Signed)
Refill for amlodipine.

## 2011-04-22 ENCOUNTER — Ambulatory Visit: Payer: Self-pay | Admitting: Unknown Physician Specialty

## 2011-04-29 ENCOUNTER — Other Ambulatory Visit: Payer: Self-pay | Admitting: Cardiovascular Disease

## 2011-08-24 ENCOUNTER — Ambulatory Visit: Payer: Self-pay | Admitting: Family Medicine

## 2011-08-29 ENCOUNTER — Other Ambulatory Visit: Payer: Self-pay | Admitting: Cardiovascular Disease

## 2011-08-29 MED ORDER — METOPROLOL TARTRATE 50 MG PO TABS: 50.0000 mg | ORAL_TABLET | Freq: Two times a day (BID) | ORAL | Status: DC

## 2011-08-29 NOTE — Telephone Encounter (Signed)
Refilled Metoprolol

## 2011-09-12 ENCOUNTER — Ambulatory Visit: Payer: Self-pay | Admitting: Family Medicine

## 2011-09-26 ENCOUNTER — Other Ambulatory Visit: Payer: Self-pay | Admitting: Cardiovascular Disease

## 2011-09-26 NOTE — Telephone Encounter (Signed)
Spoke with pt due for 6 month f/u appoint. Refilled Metoprolol until she can be seen.

## 2011-10-03 ENCOUNTER — Encounter: Payer: Self-pay | Admitting: Cardiovascular Disease

## 2011-10-03 ENCOUNTER — Ambulatory Visit (INDEPENDENT_AMBULATORY_CARE_PROVIDER_SITE_OTHER): Payer: Medicare Other | Admitting: Cardiovascular Disease

## 2011-10-03 VITALS — BP 132/72 | HR 72 | Ht 65.0 in | Wt 183.2 lb

## 2011-10-03 DIAGNOSIS — I2510 Atherosclerotic heart disease of native coronary artery without angina pectoris: Secondary | ICD-10-CM

## 2011-10-03 DIAGNOSIS — E119 Type 2 diabetes mellitus without complications: Secondary | ICD-10-CM

## 2011-10-03 DIAGNOSIS — R0602 Shortness of breath: Secondary | ICD-10-CM

## 2011-10-03 DIAGNOSIS — R0789 Other chest pain: Secondary | ICD-10-CM

## 2011-10-03 DIAGNOSIS — F17200 Nicotine dependence, unspecified, uncomplicated: Secondary | ICD-10-CM

## 2011-10-03 DIAGNOSIS — I10 Essential (primary) hypertension: Secondary | ICD-10-CM

## 2011-10-03 DIAGNOSIS — E785 Hyperlipidemia, unspecified: Secondary | ICD-10-CM

## 2011-10-03 MED ORDER — METOPROLOL TARTRATE 25 MG PO TABS: 25.0000 mg | ORAL_TABLET | Freq: Two times a day (BID) | ORAL | Status: DC

## 2011-10-03 NOTE — Progress Notes (Signed)
Patient ID: Hannah Kirby, female    DOB: 1945-10-11, 66 y.o.   MRN: [id number]  HPI Comments: Hannah Kirby is a  66 year old woman with a history of diabetes ( hemoglobin A1c greater than 8), coronary artery disease with stenting of her RCA  in January 2008, long smoking history who continues to smoke, hyperlipidemia who presents for routine followup.   She reports that she has had a difficult summer. She has had an outbreak of her granulomatous disease. There is no tear apart from prednisone for this condition. She is recovering from significant sinusitis and upper respiratory infection. She had a course of Augmentin, then Levaquin. She has had problems with her right back and pain radiating into her right hip. She wonders if she had a kidney stone. She had a CT scan and there was a small stone on the contralateral side .  She is working with Dr. Renae Fickle on her diabetes. She reports that her numbers have not been doing well recently .  Recent blood work shows total cholesterol 171, LDL 81, HDL 50, triglycerides 202, TSH 2.5, normal AST and ALT, elevated alkaline phosphatase, glucose 210   EKG shows normal sinus rhythm with rate 74 beats per minute, no significant ST or T wave changes      Outpatient Encounter Prescriptions as of 10/03/2011  Medication Sig Dispense Refill  . amLODipine (NORVASC) 5 MG tablet TAKE 1 TABLET (5 MG TOTAL) BY MOUTH DAILY.  30 tablet  5  . aspirin (ASPIR-81) 81 MG EC tablet Take 81 mg by mouth daily.        . calcium carbonate (OS-CAL) 600 MG TABS Take 600 mg by mouth 2 (two) times daily with a meal.        . chlorhexidine (PERIDEX) 0.12 % solution Use as directed 15 mLs in the mouth or throat 2 (two) times daily.  120 mL  6  . clopidogrel (PLAVIX) 75 MG tablet Take 75 mg by mouth daily.        Marland Kitchen glipiZIDE (GLUCOTROL) 10 MG tablet Take 10 mg by mouth daily.        Marland Kitchen lisinopril (PRINIVIL,ZESTRIL) 40 MG tablet Take 40 mg by mouth daily.        . metFORMIN  (GLUMETZA) 1000 MG (MOD) 24 hr tablet Take 1,000 mg by mouth 2 (two) times daily.        . metoprolol (LOPRESSOR) 50 MG tablet TAKE 1 TABLET BY MOUTH TWO TIMES DAILY  60 tablet  1  . Multiple Vitamins-Minerals (DAILY MULTI) TABS Take by mouth daily.        . nitroGLYCERIN (NITROSTAT) 0.4 MG SL tablet Place 0.4 mg under the tongue every 5 (five) minutes as needed.        . Omega-3 Fatty Acids (FISH OIL) 1000 MG CAPS Take by mouth daily. Take 4       . omeprazole (PRILOSEC) 40 MG capsule Take 40 mg by mouth daily. Take extra cap if more heartburn than ususal      . rosuvastatin (CRESTOR) 40 MG tablet Take 40 mg by mouth daily.        Marland Kitchen DISCONTD: amLODipine (NORVASC) 5 MG tablet Take 1 tablet (5 mg total) by mouth daily.  30 tablet  6     Review of Systems  Constitutional: Negative.   HENT: Negative.   Eyes: Negative.   Respiratory: Negative.   Cardiovascular: Negative.   Gastrointestinal: Negative.   Musculoskeletal: Negative.   Skin: Negative.  Neurological: Negative.   Hematological: Negative.   Psychiatric/Behavioral: Negative.   All other systems reviewed and are negative.    BP 132/72  Pulse 72  Ht 5\' 5"  (1.651 m)  Wt 183 lb 4 oz (83.122 kg)  BMI 30.49 kg/m2  Physical Exam  Nursing note and vitals reviewed. Constitutional: She is oriented to person, place, and time. She appears well-developed and well-nourished.  HENT:  Head: Normocephalic.  Nose: Nose normal.  Mouth/Throat: Oropharynx is clear and moist.  Eyes: Conjunctivae are normal. Pupils are equal, round, and reactive to light.  Neck: Normal range of motion. Neck supple. No JVD present.  Cardiovascular: Normal rate, regular rhythm, S1 normal, S2 normal, normal heart sounds and intact distal pulses.  Exam reveals no gallop and no friction rub.   No murmur heard. Pulmonary/Chest: Effort normal and breath sounds normal. No respiratory distress. She has no wheezes. She has no rales. She exhibits no tenderness.    Abdominal: Soft. Bowel sounds are normal. She exhibits no distension. There is no tenderness.  Musculoskeletal: Normal range of motion. She exhibits no edema and no tenderness.  Lymphadenopathy:    She has no cervical adenopathy.  Neurological: She is alert and oriented to person, place, and time. Coordination normal.  Skin: Skin is warm and dry. No rash noted. No erythema.  Psychiatric: She has a normal mood and affect. Her behavior is normal. Judgment and thought content normal.         Assessment and Plan

## 2011-10-03 NOTE — Assessment & Plan Note (Signed)
Currently with no symptoms of angina. No further workup at this time. Continue current medication regimen. 

## 2011-10-03 NOTE — Assessment & Plan Note (Signed)
>>  ASSESSMENT AND PLAN FOR TOBACCO USE DISORDER WRITTEN ON 10/03/2011  1:58 PM BY Antonieta Iba, MD  We have encouraged her to continue to work on weaning her cigarettes and smoking cessation. She will continue to work on this and does not want any assistance with chantix.

## 2011-10-03 NOTE — Assessment & Plan Note (Signed)
We have encouraged continued exercise, careful diet management in an effort to lose weight. 

## 2011-10-03 NOTE — Assessment & Plan Note (Signed)
Blood pressure is well controlled on today's visit. No changes made to the medications. 

## 2011-10-03 NOTE — Patient Instructions (Addendum)
You are doing well. No medication changes were made.  Please call us if you have new issues that need to be addressed before your next appt.  Your physician wants you to follow-up in: 6 months.  You will receive a reminder letter in the mail two months in advance. If you don't receive a letter, please call our office to schedule the follow-up appointment.   

## 2011-10-03 NOTE — Assessment & Plan Note (Signed)
We have encouraged her to continue to work on weaning her cigarettes and smoking cessation. She will continue to work on this and does not want any assistance with chantix.  

## 2011-10-03 NOTE — Assessment & Plan Note (Signed)
Cholesterol is mildly elevated. We have encouraged her to continue work on her diabetes and weight loss.

## 2011-10-04 ENCOUNTER — Encounter: Payer: Self-pay | Admitting: Cardiovascular Disease

## 2011-10-04 ENCOUNTER — Encounter: Payer: Self-pay | Admitting: *Deleted

## 2011-11-04 ENCOUNTER — Ambulatory Visit: Payer: Self-pay | Admitting: Family Medicine

## 2011-11-29 ENCOUNTER — Other Ambulatory Visit: Payer: Self-pay | Admitting: Cardiovascular Disease

## 2012-01-06 ENCOUNTER — Telehealth: Payer: Self-pay | Admitting: Cardiovascular Disease

## 2012-01-06 NOTE — Telephone Encounter (Signed)
Pt needs to have written scripts to mail to her new pharmacy. Pt needs 90 day for all. Will come by and pickup when ready.  plavix 75mg  lisinoprol 40mg  Amlodipine 5 Metoprolol 25mg  crestor 40mg  omeprozole 40 mg

## 2012-01-06 NOTE — Telephone Encounter (Signed)
Patient notified Rx's are ready to be picked up.

## 2012-01-06 NOTE — Telephone Encounter (Signed)
P 

## 2012-01-06 NOTE — Telephone Encounter (Signed)
See below

## 2012-02-20 DIAGNOSIS — A4901 Methicillin susceptible Staphylococcus aureus infection, unspecified site: Secondary | ICD-10-CM | POA: Diagnosis not present

## 2012-02-21 DIAGNOSIS — L0390 Cellulitis, unspecified: Secondary | ICD-10-CM | POA: Diagnosis not present

## 2012-02-21 DIAGNOSIS — L0291 Cutaneous abscess, unspecified: Secondary | ICD-10-CM | POA: Diagnosis not present

## 2012-02-23 DIAGNOSIS — B009 Herpesviral infection, unspecified: Secondary | ICD-10-CM | POA: Diagnosis not present

## 2012-02-23 DIAGNOSIS — L0291 Cutaneous abscess, unspecified: Secondary | ICD-10-CM | POA: Diagnosis not present

## 2012-03-12 ENCOUNTER — Telehealth: Payer: Self-pay | Admitting: *Deleted

## 2012-03-12 NOTE — Telephone Encounter (Signed)
Okay to have colonoscopy, need to hold all blood thinners including aspirin

## 2012-03-12 NOTE — Telephone Encounter (Signed)
Ok for colonoscopy.  ?

## 2012-03-12 NOTE — Telephone Encounter (Signed)
Pt needs to have a colonoscopy and needs to know she is ok to do this and if she needs to hold any meds.

## 2012-03-13 NOTE — Telephone Encounter (Signed)
Pt informed Understanding verb She will have MD contact us

## 2012-03-15 DIAGNOSIS — L909 Atrophic disorder of skin, unspecified: Secondary | ICD-10-CM | POA: Diagnosis not present

## 2012-03-15 DIAGNOSIS — B009 Herpesviral infection, unspecified: Secondary | ICD-10-CM | POA: Diagnosis not present

## 2012-03-15 DIAGNOSIS — D1801 Hemangioma of skin and subcutaneous tissue: Secondary | ICD-10-CM | POA: Diagnosis not present

## 2012-03-15 DIAGNOSIS — L538 Other specified erythematous conditions: Secondary | ICD-10-CM | POA: Diagnosis not present

## 2012-03-15 DIAGNOSIS — B079 Viral wart, unspecified: Secondary | ICD-10-CM | POA: Diagnosis not present

## 2012-03-15 DIAGNOSIS — L919 Hypertrophic disorder of the skin, unspecified: Secondary | ICD-10-CM | POA: Diagnosis not present

## 2012-03-15 DIAGNOSIS — Z1283 Encounter for screening for malignant neoplasm of skin: Secondary | ICD-10-CM | POA: Diagnosis not present

## 2012-04-19 DIAGNOSIS — I10 Essential (primary) hypertension: Secondary | ICD-10-CM | POA: Diagnosis not present

## 2012-04-19 DIAGNOSIS — IMO0001 Reserved for inherently not codable concepts without codable children: Secondary | ICD-10-CM | POA: Diagnosis not present

## 2012-04-19 DIAGNOSIS — R809 Proteinuria, unspecified: Secondary | ICD-10-CM | POA: Diagnosis not present

## 2012-04-19 DIAGNOSIS — E7800 Pure hypercholesterolemia, unspecified: Secondary | ICD-10-CM | POA: Diagnosis not present

## 2012-04-20 DIAGNOSIS — Z1211 Encounter for screening for malignant neoplasm of colon: Secondary | ICD-10-CM | POA: Diagnosis not present

## 2012-04-20 DIAGNOSIS — K5900 Constipation, unspecified: Secondary | ICD-10-CM | POA: Diagnosis not present

## 2012-04-20 DIAGNOSIS — Z7901 Long term (current) use of anticoagulants: Secondary | ICD-10-CM | POA: Diagnosis not present

## 2012-04-20 DIAGNOSIS — E119 Type 2 diabetes mellitus without complications: Secondary | ICD-10-CM | POA: Diagnosis not present

## 2012-04-23 DIAGNOSIS — Z23 Encounter for immunization: Secondary | ICD-10-CM | POA: Diagnosis not present

## 2012-04-23 DIAGNOSIS — S335XXA Sprain of ligaments of lumbar spine, initial encounter: Secondary | ICD-10-CM | POA: Diagnosis not present

## 2012-05-04 ENCOUNTER — Telehealth: Payer: Self-pay

## 2012-05-04 NOTE — Telephone Encounter (Signed)
Faxing clearance now

## 2012-05-08 ENCOUNTER — Ambulatory Visit: Payer: Self-pay | Admitting: Gastroenterology

## 2012-05-08 DIAGNOSIS — Z8249 Family history of ischemic heart disease and other diseases of the circulatory system: Secondary | ICD-10-CM | POA: Diagnosis not present

## 2012-05-08 DIAGNOSIS — E785 Hyperlipidemia, unspecified: Secondary | ICD-10-CM | POA: Diagnosis not present

## 2012-05-08 DIAGNOSIS — Q438 Other specified congenital malformations of intestine: Secondary | ICD-10-CM | POA: Diagnosis not present

## 2012-05-08 DIAGNOSIS — Z823 Family history of stroke: Secondary | ICD-10-CM | POA: Diagnosis not present

## 2012-05-08 DIAGNOSIS — E119 Type 2 diabetes mellitus without complications: Secondary | ICD-10-CM | POA: Diagnosis not present

## 2012-05-08 DIAGNOSIS — K3189 Other diseases of stomach and duodenum: Secondary | ICD-10-CM | POA: Diagnosis not present

## 2012-05-08 DIAGNOSIS — Z888 Allergy status to other drugs, medicaments and biological substances status: Secondary | ICD-10-CM | POA: Diagnosis not present

## 2012-05-08 DIAGNOSIS — Z801 Family history of malignant neoplasm of trachea, bronchus and lung: Secondary | ICD-10-CM | POA: Diagnosis not present

## 2012-05-08 DIAGNOSIS — Z885 Allergy status to narcotic agent status: Secondary | ICD-10-CM | POA: Diagnosis not present

## 2012-05-08 DIAGNOSIS — Z87891 Personal history of nicotine dependence: Secondary | ICD-10-CM | POA: Diagnosis not present

## 2012-05-08 DIAGNOSIS — Z7982 Long term (current) use of aspirin: Secondary | ICD-10-CM | POA: Diagnosis not present

## 2012-05-08 DIAGNOSIS — Z79899 Other long term (current) drug therapy: Secondary | ICD-10-CM | POA: Diagnosis not present

## 2012-05-08 DIAGNOSIS — K5730 Diverticulosis of large intestine without perforation or abscess without bleeding: Secondary | ICD-10-CM | POA: Diagnosis not present

## 2012-05-08 DIAGNOSIS — D126 Benign neoplasm of colon, unspecified: Secondary | ICD-10-CM | POA: Diagnosis not present

## 2012-05-08 DIAGNOSIS — Z882 Allergy status to sulfonamides status: Secondary | ICD-10-CM | POA: Diagnosis not present

## 2012-05-08 DIAGNOSIS — E049 Nontoxic goiter, unspecified: Secondary | ICD-10-CM | POA: Diagnosis not present

## 2012-05-08 DIAGNOSIS — Z7901 Long term (current) use of anticoagulants: Secondary | ICD-10-CM | POA: Diagnosis not present

## 2012-05-08 DIAGNOSIS — J410 Simple chronic bronchitis: Secondary | ICD-10-CM | POA: Diagnosis not present

## 2012-05-08 DIAGNOSIS — Z1211 Encounter for screening for malignant neoplasm of colon: Secondary | ICD-10-CM | POA: Diagnosis not present

## 2012-05-08 DIAGNOSIS — I10 Essential (primary) hypertension: Secondary | ICD-10-CM | POA: Diagnosis not present

## 2012-05-09 LAB — PATHOLOGY REPORT

## 2012-05-16 DIAGNOSIS — M4800 Spinal stenosis, site unspecified: Secondary | ICD-10-CM | POA: Diagnosis not present

## 2012-05-18 ENCOUNTER — Ambulatory Visit (INDEPENDENT_AMBULATORY_CARE_PROVIDER_SITE_OTHER): Payer: Medicare Other | Admitting: Cardiovascular Disease

## 2012-05-18 ENCOUNTER — Encounter: Payer: Self-pay | Admitting: Cardiovascular Disease

## 2012-05-18 VITALS — BP 108/72 | HR 84 | Ht 65.0 in | Wt 167.2 lb

## 2012-05-18 DIAGNOSIS — E119 Type 2 diabetes mellitus without complications: Secondary | ICD-10-CM | POA: Diagnosis not present

## 2012-05-18 DIAGNOSIS — F17200 Nicotine dependence, unspecified, uncomplicated: Secondary | ICD-10-CM

## 2012-05-18 DIAGNOSIS — I10 Essential (primary) hypertension: Secondary | ICD-10-CM

## 2012-05-18 DIAGNOSIS — I2510 Atherosclerotic heart disease of native coronary artery without angina pectoris: Secondary | ICD-10-CM | POA: Diagnosis not present

## 2012-05-18 DIAGNOSIS — R0602 Shortness of breath: Secondary | ICD-10-CM | POA: Diagnosis not present

## 2012-05-18 DIAGNOSIS — E785 Hyperlipidemia, unspecified: Secondary | ICD-10-CM

## 2012-05-18 MED ORDER — NITROGLYCERIN 0.4 MG SL SUBL: 0.4000 mg | SUBLINGUAL_TABLET | SUBLINGUAL | Status: DC | PRN

## 2012-05-18 NOTE — Progress Notes (Signed)
Patient ID: Hannah Kirby, female    DOB: Apr 01, 1945, 67 y.o.   MRN: [id number]  HPI Comments: Hannah Kirby is a  67 year old woman with a history of diabetes ( hemoglobin A1c 7.4), coronary artery disease with stenting of her RCA  in January 2008, long smoking history who continues to smoke, hyperlipidemia who presents for routine followup.she reports having dramatic weight loss recently, over the past year or so on new diabetes medicine, victoza.   outbreak of her granulomatous disease in summary 2013,  prednisone for this condition.  This year, she has had pinched nerve and is on Neurontin and pain medication. Reports having viral outbreak on the left side of her face. Weight has dropped 15-20 pounds since last summer, 40 pounds over the past several years. She attributes this to new diabetes medication which has changed her appetite. She continues to smoke  She is working with Dr. Renae Fickle on her diabetes. She reports that her numbers have been  improving   EKG shows normal sinus rhythm with rate 74 beats per minute, no significant ST or T wave changes Blood pressure in our office is low today, she reports systolic pressure in the 120 range at home      Outpatient Encounter Prescriptions as of 05/18/2012  Medication Sig Dispense Refill  . amLODipine (NORVASC) 5 MG tablet TAKE 1 TABLET (5 MG TOTAL) BY MOUTH DAILY.  30 tablet  5  . aspirin (ASPIR-81) 81 MG EC tablet Take 81 mg by mouth daily.        . calcium carbonate (OS-CAL) 600 MG TABS Take 600 mg by mouth 2 (two) times daily with a meal.        . chlorhexidine (PERIDEX) 0.12 % solution Use as directed 15 mLs in the mouth or throat 2 (two) times daily.  120 mL  6  . clopidogrel (PLAVIX) 75 MG tablet Take 75 mg by mouth daily.        Marland Kitchen gabapentin (NEURONTIN) 100 MG capsule Take 100 mg by mouth 3 (three) times daily.      Marland Kitchen HYDROcodone-acetaminophen (NORCO/VICODIN) 5-325 MG per tablet Take 1 tablet by mouth every 6 (six) hours as  needed for pain.      . Liraglutide (VICTOZA) 18 MG/3ML SOLN injection Inject 1.8 mg into the skin daily.      Marland Kitchen lisinopril (PRINIVIL,ZESTRIL) 40 MG tablet Take 40 mg by mouth daily.        . metFORMIN (GLUMETZA) 1000 MG (MOD) 24 hr tablet Take 1,000 mg by mouth 2 (two) times daily.        . metoprolol (LOPRESSOR) 25 MG tablet Take 1 tablet (25 mg total) by mouth 2 (two) times daily.  180 tablet  3  . Multiple Vitamins-Minerals (DAILY MULTI) TABS Take by mouth daily.        . nitroGLYCERIN (NITROSTAT) 0.4 MG SL tablet Place 0.4 mg under the tongue every 5 (five) minutes as needed.        . Omega-3 Fatty Acids (FISH OIL) 1000 MG CAPS Take by mouth daily. Take 4       . omeprazole (PRILOSEC) 40 MG capsule Take 40 mg by mouth daily. Take extra cap if more heartburn than ususal      . rosuvastatin (CRESTOR) 40 MG tablet Take 40 mg by mouth daily.        . [DISCONTINUED] amLODipine (NORVASC) 5 MG tablet TAKE 1 TABLET (5 MG TOTAL) BY MOUTH DAILY.  90 tablet  5  . [  DISCONTINUED] glipiZIDE (GLUCOTROL) 10 MG tablet Take 10 mg by mouth daily.         No facility-administered encounter medications on file as of 05/18/2012.     Review of Systems  Constitutional: Positive for unexpected weight change.  HENT: Negative.   Eyes: Negative.   Respiratory: Negative.   Cardiovascular: Negative.   Gastrointestinal: Negative.   Musculoskeletal: Positive for back pain.  Skin: Negative.   Neurological: Negative.   Psychiatric/Behavioral: Negative.   All other systems reviewed and are negative.    BP 108/72  Pulse 84  Ht 5\' 5"  (1.651 m)  Wt 167 lb 4 oz (75.864 kg)  BMI 27.83 kg/m2  Physical Exam  Nursing note and vitals reviewed. Constitutional: She is oriented to person, place, and time. She appears well-developed and well-nourished.  HENT:  Head: Normocephalic.  Nose: Nose normal.  Mouth/Throat: Oropharynx is clear and moist.  Eyes: Conjunctivae are normal. Pupils are equal, round, and reactive  to light.  Neck: Normal range of motion. Neck supple. No JVD present.  Cardiovascular: Normal rate, regular rhythm, S1 normal, S2 normal, normal heart sounds and intact distal pulses.  Exam reveals no gallop and no friction rub.   No murmur heard. Pulmonary/Chest: Effort normal and breath sounds normal. No respiratory distress. She has no wheezes. She has no rales. She exhibits no tenderness.  Abdominal: Soft. Bowel sounds are normal. She exhibits no distension. There is no tenderness.  Musculoskeletal: Normal range of motion. She exhibits no edema and no tenderness.  Lymphadenopathy:    She has no cervical adenopathy.  Neurological: She is alert and oriented to person, place, and time. Coordination normal.  Skin: Skin is warm and dry. No rash noted. No erythema.  Psychiatric: She has a normal mood and affect. Her behavior is normal. Judgment and thought content normal.    Assessment and Plan

## 2012-05-18 NOTE — Assessment & Plan Note (Signed)
We have encouraged her to continue to work on weaning her cigarettes and smoking cessation. She will continue to work on this and does not want any assistance with chantix.  

## 2012-05-18 NOTE — Assessment & Plan Note (Signed)
We have asked her to watch her blood pressure at home. If this continues to run low, she could hold her amlodipine. Lower blood pressure likely from recent weight loss.

## 2012-05-18 NOTE — Assessment & Plan Note (Signed)
We have congratulated her on her  improved diabetes numbers.

## 2012-05-18 NOTE — Assessment & Plan Note (Signed)
Goal LDL less than 70. We will give her an order slip to have labs drawn next week.

## 2012-05-18 NOTE — Assessment & Plan Note (Signed)
>>  ASSESSMENT AND PLAN FOR TOBACCO USE DISORDER WRITTEN ON 05/18/2012  1:55 PM BY Antonieta Iba, MD  We have encouraged her to continue to work on weaning her cigarettes and smoking cessation. She will continue to work on this and does not want any assistance with chantix.

## 2012-05-18 NOTE — Patient Instructions (Addendum)
You are doing well. No medication changes were made.  If blood pressure runs low, hold the amlodipine  Please call us if you have new issues that need to be addressed before your next appt.  Your physician wants you to follow-up in: 6 months.  You will receive a reminder letter in the mail two months in advance. If you don't receive a letter, please call our office to schedule the follow-up appointment.

## 2012-05-18 NOTE — Assessment & Plan Note (Signed)
Currently with no symptoms of angina. No further workup at this time. Continue current medication regimen. 

## 2012-05-24 DIAGNOSIS — R0602 Shortness of breath: Secondary | ICD-10-CM | POA: Diagnosis not present

## 2012-05-24 DIAGNOSIS — I2510 Atherosclerotic heart disease of native coronary artery without angina pectoris: Secondary | ICD-10-CM | POA: Diagnosis not present

## 2012-05-25 ENCOUNTER — Other Ambulatory Visit: Payer: Self-pay

## 2012-05-25 DIAGNOSIS — I2510 Atherosclerotic heart disease of native coronary artery without angina pectoris: Secondary | ICD-10-CM

## 2012-05-25 DIAGNOSIS — R0602 Shortness of breath: Secondary | ICD-10-CM

## 2012-05-28 ENCOUNTER — Ambulatory Visit: Payer: Self-pay | Admitting: Orthopedic Surgery

## 2012-05-28 DIAGNOSIS — M47817 Spondylosis without myelopathy or radiculopathy, lumbosacral region: Secondary | ICD-10-CM | POA: Diagnosis not present

## 2012-05-28 DIAGNOSIS — M5126 Other intervertebral disc displacement, lumbar region: Secondary | ICD-10-CM | POA: Diagnosis not present

## 2012-06-18 DIAGNOSIS — M4800 Spinal stenosis, site unspecified: Secondary | ICD-10-CM | POA: Diagnosis not present

## 2012-06-22 DIAGNOSIS — M2560 Stiffness of unspecified joint, not elsewhere classified: Secondary | ICD-10-CM | POA: Diagnosis not present

## 2012-06-22 DIAGNOSIS — M48061 Spinal stenosis, lumbar region without neurogenic claudication: Secondary | ICD-10-CM | POA: Diagnosis not present

## 2012-06-22 DIAGNOSIS — M6281 Muscle weakness (generalized): Secondary | ICD-10-CM | POA: Diagnosis not present

## 2012-06-25 DIAGNOSIS — M2560 Stiffness of unspecified joint, not elsewhere classified: Secondary | ICD-10-CM | POA: Diagnosis not present

## 2012-06-25 DIAGNOSIS — M6281 Muscle weakness (generalized): Secondary | ICD-10-CM | POA: Diagnosis not present

## 2012-06-25 DIAGNOSIS — M48061 Spinal stenosis, lumbar region without neurogenic claudication: Secondary | ICD-10-CM | POA: Diagnosis not present

## 2012-06-27 DIAGNOSIS — M48061 Spinal stenosis, lumbar region without neurogenic claudication: Secondary | ICD-10-CM | POA: Diagnosis not present

## 2012-06-27 DIAGNOSIS — M2560 Stiffness of unspecified joint, not elsewhere classified: Secondary | ICD-10-CM | POA: Diagnosis not present

## 2012-06-27 DIAGNOSIS — M6281 Muscle weakness (generalized): Secondary | ICD-10-CM | POA: Diagnosis not present

## 2012-06-28 DIAGNOSIS — M47817 Spondylosis without myelopathy or radiculopathy, lumbosacral region: Secondary | ICD-10-CM | POA: Diagnosis not present

## 2012-07-02 DIAGNOSIS — M2560 Stiffness of unspecified joint, not elsewhere classified: Secondary | ICD-10-CM | POA: Diagnosis not present

## 2012-07-02 DIAGNOSIS — M6281 Muscle weakness (generalized): Secondary | ICD-10-CM | POA: Diagnosis not present

## 2012-07-02 DIAGNOSIS — M48061 Spinal stenosis, lumbar region without neurogenic claudication: Secondary | ICD-10-CM | POA: Diagnosis not present

## 2012-07-05 DIAGNOSIS — M6281 Muscle weakness (generalized): Secondary | ICD-10-CM | POA: Diagnosis not present

## 2012-07-05 DIAGNOSIS — M48061 Spinal stenosis, lumbar region without neurogenic claudication: Secondary | ICD-10-CM | POA: Diagnosis not present

## 2012-07-05 DIAGNOSIS — M2560 Stiffness of unspecified joint, not elsewhere classified: Secondary | ICD-10-CM | POA: Diagnosis not present

## 2012-07-09 DIAGNOSIS — M2560 Stiffness of unspecified joint, not elsewhere classified: Secondary | ICD-10-CM | POA: Diagnosis not present

## 2012-07-09 DIAGNOSIS — M6281 Muscle weakness (generalized): Secondary | ICD-10-CM | POA: Diagnosis not present

## 2012-07-09 DIAGNOSIS — M48061 Spinal stenosis, lumbar region without neurogenic claudication: Secondary | ICD-10-CM | POA: Diagnosis not present

## 2012-07-11 DIAGNOSIS — M48061 Spinal stenosis, lumbar region without neurogenic claudication: Secondary | ICD-10-CM | POA: Diagnosis not present

## 2012-07-11 DIAGNOSIS — M6281 Muscle weakness (generalized): Secondary | ICD-10-CM | POA: Diagnosis not present

## 2012-07-11 DIAGNOSIS — M2560 Stiffness of unspecified joint, not elsewhere classified: Secondary | ICD-10-CM | POA: Diagnosis not present

## 2012-07-18 DIAGNOSIS — M2560 Stiffness of unspecified joint, not elsewhere classified: Secondary | ICD-10-CM | POA: Diagnosis not present

## 2012-07-18 DIAGNOSIS — M48061 Spinal stenosis, lumbar region without neurogenic claudication: Secondary | ICD-10-CM | POA: Diagnosis not present

## 2012-07-18 DIAGNOSIS — M6281 Muscle weakness (generalized): Secondary | ICD-10-CM | POA: Diagnosis not present

## 2012-07-20 DIAGNOSIS — M6281 Muscle weakness (generalized): Secondary | ICD-10-CM | POA: Diagnosis not present

## 2012-07-20 DIAGNOSIS — M2560 Stiffness of unspecified joint, not elsewhere classified: Secondary | ICD-10-CM | POA: Diagnosis not present

## 2012-07-20 DIAGNOSIS — M48061 Spinal stenosis, lumbar region without neurogenic claudication: Secondary | ICD-10-CM | POA: Diagnosis not present

## 2012-07-30 DIAGNOSIS — M48061 Spinal stenosis, lumbar region without neurogenic claudication: Secondary | ICD-10-CM | POA: Diagnosis not present

## 2012-09-17 DIAGNOSIS — Z1231 Encounter for screening mammogram for malignant neoplasm of breast: Secondary | ICD-10-CM | POA: Diagnosis not present

## 2012-10-11 DIAGNOSIS — IMO0001 Reserved for inherently not codable concepts without codable children: Secondary | ICD-10-CM | POA: Diagnosis not present

## 2012-10-11 DIAGNOSIS — I10 Essential (primary) hypertension: Secondary | ICD-10-CM | POA: Diagnosis not present

## 2012-10-18 DIAGNOSIS — E119 Type 2 diabetes mellitus without complications: Secondary | ICD-10-CM | POA: Diagnosis not present

## 2012-10-18 DIAGNOSIS — E7800 Pure hypercholesterolemia, unspecified: Secondary | ICD-10-CM | POA: Diagnosis not present

## 2012-10-18 DIAGNOSIS — R824 Acetonuria: Secondary | ICD-10-CM | POA: Diagnosis not present

## 2012-11-01 DIAGNOSIS — S335XXA Sprain of ligaments of lumbar spine, initial encounter: Secondary | ICD-10-CM | POA: Diagnosis not present

## 2012-11-01 DIAGNOSIS — Z23 Encounter for immunization: Secondary | ICD-10-CM | POA: Diagnosis not present

## 2012-11-16 ENCOUNTER — Encounter: Payer: Self-pay | Admitting: Cardiovascular Disease

## 2012-11-16 ENCOUNTER — Ambulatory Visit (INDEPENDENT_AMBULATORY_CARE_PROVIDER_SITE_OTHER): Payer: Medicare Other | Admitting: Cardiovascular Disease

## 2012-11-16 VITALS — BP 112/70 | HR 76 | Ht 66.0 in | Wt 162.5 lb

## 2012-11-16 DIAGNOSIS — F17200 Nicotine dependence, unspecified, uncomplicated: Secondary | ICD-10-CM

## 2012-11-16 DIAGNOSIS — E119 Type 2 diabetes mellitus without complications: Secondary | ICD-10-CM

## 2012-11-16 DIAGNOSIS — E785 Hyperlipidemia, unspecified: Secondary | ICD-10-CM

## 2012-11-16 DIAGNOSIS — I2510 Atherosclerotic heart disease of native coronary artery without angina pectoris: Secondary | ICD-10-CM | POA: Diagnosis not present

## 2012-11-16 DIAGNOSIS — R0602 Shortness of breath: Secondary | ICD-10-CM

## 2012-11-16 DIAGNOSIS — I10 Essential (primary) hypertension: Secondary | ICD-10-CM

## 2012-11-16 MED ORDER — LISINOPRIL 40 MG PO TABS: 40.0000 mg | ORAL_TABLET | Freq: Every day | ORAL | Status: DC

## 2012-11-16 MED ORDER — METOPROLOL TARTRATE 25 MG PO TABS: 25.0000 mg | ORAL_TABLET | Freq: Two times a day (BID) | ORAL | Status: DC

## 2012-11-16 MED ORDER — AMLODIPINE BESYLATE 5 MG PO TABS: 5.0000 mg | ORAL_TABLET | Freq: Every day | ORAL | Status: DC

## 2012-11-16 MED ORDER — CLOPIDOGREL BISULFATE 75 MG PO TABS: 75.0000 mg | ORAL_TABLET | Freq: Every day | ORAL | Status: DC

## 2012-11-16 MED ORDER — NITROGLYCERIN 0.4 MG SL SUBL: 0.4000 mg | SUBLINGUAL_TABLET | SUBLINGUAL | Status: DC | PRN

## 2012-11-16 MED ORDER — ROSUVASTATIN CALCIUM 40 MG PO TABS: 40.0000 mg | ORAL_TABLET | Freq: Every day | ORAL | Status: DC

## 2012-11-16 MED ORDER — OMEPRAZOLE 40 MG PO CPDR: 40.0000 mg | DELAYED_RELEASE_CAPSULE | Freq: Two times a day (BID) | ORAL | Status: DC

## 2012-11-16 NOTE — Assessment & Plan Note (Signed)
Currently with no symptoms of angina. No further workup at this time. Continue current medication regimen. 

## 2012-11-16 NOTE — Progress Notes (Signed)
Patient ID: CADYN RODGER, female    DOB: 1945-03-13, 67 y.o.   MRN: [id number]  HPI Comments: [name] is a  67 year-old woman with a history of diabetes ( hemoglobin A1c 7.2), obesity significantly improved with recent weight loss on  Victoza, coronary artery disease with stenting of her RCA  in January 2008, long smoking history who continues to smoke, hyperlipidemia who presents for routine followup. outbreak of her granulomatous disease in summary 2013,  prednisone for this condition.  History pinched nerve and is on pain medication. On today's visit she reports DJD, spinal stenosis and worsening back pain.   Weight has dropped more than 4 pounds from her prior clinic visit 6 months ago. She attributes this to new diabetes medication which has changed her appetite.  She continues to smoke. She is working with Dr. Renae Fickle on her diabetes. Hemoglobin A1c improved . Uncertain if her blood pressure has been dropping as she has not been checking his closely   EKG shows normal sinus rhythm with rate 76 beats per minute, no significant ST or T wave changes      Outpatient Encounter Prescriptions as of 11/16/2012  Medication Sig Dispense Refill  . amLODipine (NORVASC) 5 MG tablet TAKE 1 TABLET (5 MG TOTAL) BY MOUTH DAILY.  30 tablet  5  . aspirin (ASPIR-81) 81 MG EC tablet Take 81 mg by mouth daily.        . calcium carbonate (OS-CAL) 600 MG TABS Take 600 mg by mouth daily with breakfast.       . chlorhexidine (PERIDEX) 0.12 % solution Use as directed 15 mLs in the mouth or throat 2 (two) times daily.  120 mL  6  . clopidogrel (PLAVIX) 75 MG tablet Take 75 mg by mouth daily.        Marland Kitchen gabapentin (NEURONTIN) 100 MG capsule Take 100 mg by mouth 3 (three) times daily.      Marland Kitchen HYDROcodone-acetaminophen (NORCO/VICODIN) 5-325 MG per tablet Take 1 tablet by mouth every 6 (six) hours as needed for pain.      . Liraglutide (VICTOZA) 18 MG/3ML SOLN injection Inject 1.2 mg into the skin daily.        Marland Kitchen lisinopril (PRINIVIL,ZESTRIL) 40 MG tablet Take 40 mg by mouth daily.        . metFORMIN (GLUMETZA) 1000 MG (MOD) 24 hr tablet Take 1,000 mg by mouth 2 (two) times daily.        . metoprolol (LOPRESSOR) 25 MG tablet Take 1 tablet (25 mg total) by mouth 2 (two) times daily.  180 tablet  3  . Multiple Vitamins-Minerals (DAILY MULTI) TABS Take by mouth daily.        . nitroGLYCERIN (NITROSTAT) 0.4 MG SL tablet Place 1 tablet (0.4 mg total) under the tongue every 5 (five) minutes as needed.  25 tablet  6  . Omega-3 Fatty Acids (FISH OIL) 1000 MG CAPS Take by mouth daily. Take 4       . omeprazole (PRILOSEC) 40 MG capsule Take 40 mg by mouth 2 (two) times daily. Take extra cap if more heartburn than ususal      . rosuvastatin (CRESTOR) 40 MG tablet Take 40 mg by mouth daily.         No facility-administered encounter medications on file as of 11/16/2012.     Review of Systems  Constitutional: Positive for unexpected weight change.  HENT: Negative.   Eyes: Negative.   Respiratory: Negative.   Cardiovascular:  Negative.   Gastrointestinal: Negative.   Musculoskeletal: Positive for back pain.  Skin: Negative.   Neurological: Negative.   Psychiatric/Behavioral: Negative.   All other systems reviewed and are negative.    BP 112/70  Pulse 76  Ht 5\' 6"  (1.676 m)  Wt 162 lb 8 oz (73.71 kg)  BMI 26.24 kg/m2  Physical Exam  Nursing note and vitals reviewed. Constitutional: She is oriented to person, place, and time. She appears well-developed and well-nourished.  HENT:  Head: Normocephalic.  Nose: Nose normal.  Mouth/Throat: Oropharynx is clear and moist.  Eyes: Conjunctivae are normal. Pupils are equal, round, and reactive to light.  Neck: Normal range of motion. Neck supple. No JVD present.  Cardiovascular: Normal rate, regular rhythm, S1 normal, S2 normal, normal heart sounds and intact distal pulses.  Exam reveals no gallop and no friction rub.   No murmur heard. Pulmonary/Chest:  Effort normal and breath sounds normal. No respiratory distress. She has no wheezes. She has no rales. She exhibits no tenderness.  Abdominal: Soft. Bowel sounds are normal. She exhibits no distension. There is no tenderness.  Musculoskeletal: Normal range of motion. She exhibits no edema and no tenderness.  Lymphadenopathy:    She has no cervical adenopathy.  Neurological: She is alert and oriented to person, place, and time. Coordination normal.  Skin: Skin is warm and dry. No rash noted. No erythema.  Psychiatric: She has a normal mood and affect. Her behavior is normal. Judgment and thought content normal.    Assessment and Plan

## 2012-11-16 NOTE — Assessment & Plan Note (Signed)
>>  ASSESSMENT AND PLAN FOR TOBACCO USE DISORDER WRITTEN ON 11/16/2012  1:22 PM BY Antonieta Iba, MD  We have encouraged her to continue to work on weaning her cigarettes and smoking cessation. She will continue to work on this and does not want any assistance with chantix.

## 2012-11-16 NOTE — Assessment & Plan Note (Signed)
Followed by Dr. Renae Fickle. Weight is coming down, she is doing much better

## 2012-11-16 NOTE — Assessment & Plan Note (Signed)
We have encouraged her to continue to work on weaning her cigarettes and smoking cessation. She will continue to work on this and does not want any assistance with chantix.  

## 2012-11-16 NOTE — Assessment & Plan Note (Signed)
We've asked her to monitor her blood pressure at home. If blood pressure runs low, she could cut her amlodipine in half

## 2012-11-16 NOTE — Patient Instructions (Addendum)
You are doing well.  Monitor your blood pressure If it runs low, cut the amlodipine in 1/2  Come in for blood work April 2015  Please call us if you have new issues that need to be addressed before your next appt.  Your physician wants you to follow-up in: 6 months.  You will receive a reminder letter in the mail two months in advance. If you don't receive a letter, please call our office to schedule the follow-up appointment.

## 2012-11-16 NOTE — Assessment & Plan Note (Signed)
And we'll check her cholesterol again in April 2015. Currently at goal. There is a significant drop, we could decrease or Crestor in half

## 2012-12-17 DIAGNOSIS — Z1330 Encounter for screening examination for mental health and behavioral disorders, unspecified: Secondary | ICD-10-CM | POA: Diagnosis not present

## 2012-12-17 DIAGNOSIS — Z23 Encounter for immunization: Secondary | ICD-10-CM | POA: Diagnosis not present

## 2012-12-17 DIAGNOSIS — M549 Dorsalgia, unspecified: Secondary | ICD-10-CM | POA: Diagnosis not present

## 2012-12-17 DIAGNOSIS — Z1331 Encounter for screening for depression: Secondary | ICD-10-CM | POA: Diagnosis not present

## 2012-12-27 DIAGNOSIS — L259 Unspecified contact dermatitis, unspecified cause: Secondary | ICD-10-CM | POA: Diagnosis not present

## 2012-12-28 DIAGNOSIS — E663 Overweight: Secondary | ICD-10-CM | POA: Diagnosis not present

## 2012-12-28 DIAGNOSIS — M549 Dorsalgia, unspecified: Secondary | ICD-10-CM | POA: Diagnosis not present

## 2012-12-28 DIAGNOSIS — M48061 Spinal stenosis, lumbar region without neurogenic claudication: Secondary | ICD-10-CM | POA: Diagnosis not present

## 2012-12-28 DIAGNOSIS — IMO0002 Reserved for concepts with insufficient information to code with codable children: Secondary | ICD-10-CM | POA: Diagnosis not present

## 2013-01-06 DIAGNOSIS — J019 Acute sinusitis, unspecified: Secondary | ICD-10-CM | POA: Diagnosis not present

## 2013-01-18 DIAGNOSIS — K921 Melena: Secondary | ICD-10-CM | POA: Diagnosis not present

## 2013-02-05 DIAGNOSIS — M549 Dorsalgia, unspecified: Secondary | ICD-10-CM | POA: Diagnosis not present

## 2013-02-18 DIAGNOSIS — J209 Acute bronchitis, unspecified: Secondary | ICD-10-CM | POA: Diagnosis not present

## 2013-02-18 DIAGNOSIS — J449 Chronic obstructive pulmonary disease, unspecified: Secondary | ICD-10-CM | POA: Diagnosis not present

## 2013-03-06 DIAGNOSIS — H2510 Age-related nuclear cataract, unspecified eye: Secondary | ICD-10-CM | POA: Diagnosis not present

## 2013-03-14 DIAGNOSIS — L259 Unspecified contact dermatitis, unspecified cause: Secondary | ICD-10-CM | POA: Diagnosis not present

## 2013-03-14 DIAGNOSIS — L570 Actinic keratosis: Secondary | ICD-10-CM | POA: Diagnosis not present

## 2013-03-14 DIAGNOSIS — D485 Neoplasm of uncertain behavior of skin: Secondary | ICD-10-CM | POA: Diagnosis not present

## 2013-03-14 DIAGNOSIS — Z1283 Encounter for screening for malignant neoplasm of skin: Secondary | ICD-10-CM | POA: Diagnosis not present

## 2013-03-14 DIAGNOSIS — L821 Other seborrheic keratosis: Secondary | ICD-10-CM | POA: Diagnosis not present

## 2013-03-14 DIAGNOSIS — B079 Viral wart, unspecified: Secondary | ICD-10-CM | POA: Diagnosis not present

## 2013-03-14 DIAGNOSIS — D2370 Other benign neoplasm of skin of unspecified lower limb, including hip: Secondary | ICD-10-CM | POA: Diagnosis not present

## 2013-04-22 DIAGNOSIS — E785 Hyperlipidemia, unspecified: Secondary | ICD-10-CM | POA: Diagnosis not present

## 2013-04-22 DIAGNOSIS — E119 Type 2 diabetes mellitus without complications: Secondary | ICD-10-CM | POA: Diagnosis not present

## 2013-04-25 DIAGNOSIS — E7800 Pure hypercholesterolemia, unspecified: Secondary | ICD-10-CM | POA: Diagnosis not present

## 2013-04-25 DIAGNOSIS — R809 Proteinuria, unspecified: Secondary | ICD-10-CM | POA: Diagnosis not present

## 2013-04-25 DIAGNOSIS — E119 Type 2 diabetes mellitus without complications: Secondary | ICD-10-CM | POA: Diagnosis not present

## 2013-05-09 DIAGNOSIS — D485 Neoplasm of uncertain behavior of skin: Secondary | ICD-10-CM | POA: Diagnosis not present

## 2013-05-09 DIAGNOSIS — D2370 Other benign neoplasm of skin of unspecified lower limb, including hip: Secondary | ICD-10-CM | POA: Diagnosis not present

## 2013-05-09 DIAGNOSIS — L719 Rosacea, unspecified: Secondary | ICD-10-CM | POA: Diagnosis not present

## 2013-05-14 ENCOUNTER — Other Ambulatory Visit: Payer: Self-pay | Admitting: Neurosurgery

## 2013-05-14 ENCOUNTER — Telehealth: Payer: Self-pay

## 2013-05-14 DIAGNOSIS — M549 Dorsalgia, unspecified: Secondary | ICD-10-CM | POA: Diagnosis not present

## 2013-05-14 DIAGNOSIS — I10 Essential (primary) hypertension: Secondary | ICD-10-CM | POA: Diagnosis not present

## 2013-05-14 DIAGNOSIS — E663 Overweight: Secondary | ICD-10-CM | POA: Diagnosis not present

## 2013-05-14 DIAGNOSIS — IMO0002 Reserved for concepts with insufficient information to code with codable children: Secondary | ICD-10-CM | POA: Diagnosis not present

## 2013-05-14 NOTE — Telephone Encounter (Signed)
[facility] calling needs clearance for myelogram, requesting pt stop Plavix for 5 days. Please call and advise.

## 2013-05-14 NOTE — Telephone Encounter (Signed)
Ok to stop plavix for time needed for procedure

## 2013-05-15 DIAGNOSIS — H40009 Preglaucoma, unspecified, unspecified eye: Secondary | ICD-10-CM | POA: Diagnosis not present

## 2013-05-15 DIAGNOSIS — H25019 Cortical age-related cataract, unspecified eye: Secondary | ICD-10-CM | POA: Diagnosis not present

## 2013-05-15 NOTE — Telephone Encounter (Signed)
Spoke w/ Andee Poles and advised her of Dr. Donivan Scull recommendation. She will proceed w/ scheduling pt's mylogram and call w/ further questions or concerns.

## 2013-05-16 ENCOUNTER — Encounter: Payer: Self-pay | Admitting: Cardiovascular Disease

## 2013-05-16 ENCOUNTER — Ambulatory Visit (INDEPENDENT_AMBULATORY_CARE_PROVIDER_SITE_OTHER): Payer: Medicare Other | Admitting: Cardiovascular Disease

## 2013-05-16 VITALS — BP 128/72 | Ht 65.0 in | Wt 158.8 lb

## 2013-05-16 DIAGNOSIS — I10 Essential (primary) hypertension: Secondary | ICD-10-CM

## 2013-05-16 DIAGNOSIS — I2510 Atherosclerotic heart disease of native coronary artery without angina pectoris: Secondary | ICD-10-CM | POA: Diagnosis not present

## 2013-05-16 DIAGNOSIS — R0602 Shortness of breath: Secondary | ICD-10-CM

## 2013-05-16 DIAGNOSIS — E785 Hyperlipidemia, unspecified: Secondary | ICD-10-CM

## 2013-05-16 DIAGNOSIS — F17200 Nicotine dependence, unspecified, uncomplicated: Secondary | ICD-10-CM

## 2013-05-16 DIAGNOSIS — E119 Type 2 diabetes mellitus without complications: Secondary | ICD-10-CM

## 2013-05-16 DIAGNOSIS — E669 Obesity, unspecified: Secondary | ICD-10-CM

## 2013-05-16 MED ORDER — NITROGLYCERIN 0.4 MG SL SUBL: 0.4000 mg | SUBLINGUAL_TABLET | SUBLINGUAL | Status: DC | PRN

## 2013-05-16 NOTE — Assessment & Plan Note (Signed)
We have encouraged her to continue to work on weaning her cigarettes and smoking cessation. She will continue to work on this and does not want any assistance with chantix.  

## 2013-05-16 NOTE — Assessment & Plan Note (Signed)
Diabetes numbers much improved under the guidance of Dr. Venia Minks and Dr. Eddie Dibbles. hemoglobin A1c 6.8.  Eddie Dibbles.

## 2013-05-16 NOTE — Patient Instructions (Signed)
You are doing well. No medication changes were made.  Please call us if you have new issues that need to be addressed before your next appt.  Your physician wants you to follow-up in: 6 months.  You will receive a reminder letter in the mail two months in advance. If you don't receive a letter, please call our office to schedule the follow-up appointment.   

## 2013-05-16 NOTE — Assessment & Plan Note (Signed)
Currently with no symptoms of angina. No further workup at this time. Continue current medication regimen. 

## 2013-05-16 NOTE — Assessment & Plan Note (Signed)
We have encouraged continued exercise, careful diet management in an effort to lose weight. 

## 2013-05-16 NOTE — Progress Notes (Signed)
Patient ID: Hannah Kirby, female    DOB: 01-14-1946, 68 y.o.   MRN: [id number]  HPI Comments: [name] is a  68 year-old woman with a history of diabetes ( hemoglobin A1c 7.2), obesity significantly improved with recent weight loss on  Victoza, coronary artery disease with stenting of her RCA  in January 2008, long smoking history who continues to smoke, hyperlipidemia who presents for routine followup. outbreak of her granulomatous disease in summary 2013,  prednisone for this condition.  History pinched nerve and is on pain medication.  On today's visit she reports DJD, spinal stenosis and worsening back pain.  This has been a chronic issue recently  Weight has been relatively stable. Doing well on her current diabetes regimen eared She continues to smoke. She is working with Dr. Eddie Dibbles on her diabetes. Hemoglobin A1c 6.8. Denies having any chest pain, shortness of breath. Unable to exercise secondary to back pain.   EKG shows normal sinus rhythm , no significant ST or T wave changes      Outpatient Encounter Prescriptions as of 05/16/2013  Medication Sig  . amLODipine (NORVASC) 5 MG tablet Take 1 tablet (5 mg total) by mouth daily.  Marland Kitchen aspirin (ASPIR-81) 81 MG EC tablet Take 81 mg by mouth daily.    . calcium carbonate (OS-CAL) 600 MG TABS Take 600 mg by mouth daily with breakfast.   . chlorhexidine (PERIDEX) 0.12 % solution Use as directed 15 mLs in the mouth or throat 2 (two) times daily.  . clopidogrel (PLAVIX) 75 MG tablet Take 1 tablet (75 mg total) by mouth daily.  Marland Kitchen doxycycline (DORYX) 100 MG DR capsule Take 100 mg by mouth 2 (two) times daily.  Marland Kitchen gabapentin (NEURONTIN) 100 MG capsule Take 100 mg by mouth 3 (three) times daily.  Marland Kitchen HYDROcodone-acetaminophen (NORCO/VICODIN) 5-325 MG per tablet Take 1 tablet by mouth every 6 (six) hours as needed for pain.  . Liraglutide (VICTOZA) 18 MG/3ML SOLN injection Inject 1.2 mg into the skin daily.   Marland Kitchen lisinopril  (PRINIVIL,ZESTRIL) 40 MG tablet Take 1 tablet (40 mg total) by mouth daily.  . metFORMIN (GLUMETZA) 1000 MG (MOD) 24 hr tablet Take 1,000 mg by mouth 2 (two) times daily.    . metoprolol tartrate (LOPRESSOR) 25 MG tablet Take 1 tablet (25 mg total) by mouth 2 (two) times daily.  . Multiple Vitamins-Minerals (DAILY MULTI) TABS Take by mouth daily.    . nitroGLYCERIN (NITROSTAT) 0.4 MG SL tablet Place 1 tablet (0.4 mg total) under the tongue every 5 (five) minutes as needed.  . Omega-3 Fatty Acids (FISH OIL) 1000 MG CAPS Take by mouth daily. Take 4   . omeprazole (PRILOSEC) 40 MG capsule Take 1 capsule (40 mg total) by mouth 2 (two) times daily. Take extra cap if more heartburn than ususal  . ondansetron (ZOFRAN) 4 MG tablet Take 4 mg by mouth every 8 (eight) hours as needed for nausea or vomiting.  . rosuvastatin (CRESTOR) 40 MG tablet Take 1 tablet (40 mg total) by mouth daily.     Review of Systems  HENT: Negative.   Eyes: Negative.   Respiratory: Negative.   Cardiovascular: Negative.   Gastrointestinal: Negative.   Endocrine: Negative.   Musculoskeletal: Positive for back pain.  Skin: Negative.   Allergic/Immunologic: Negative.   Neurological: Negative.   Hematological: Negative.   Psychiatric/Behavioral: Negative.   All other systems reviewed and are negative.    BP 128/72  Ht 5\' 5"  (1.651 m)  Wt 158 lb 12 oz (72.009 kg)  BMI 26.42 kg/m2  Physical Exam  Nursing note and vitals reviewed. Constitutional: She is oriented to person, place, and time. She appears well-developed and well-nourished.  HENT:  Head: Normocephalic.  Nose: Nose normal.  Mouth/Throat: Oropharynx is clear and moist.  Eyes: Conjunctivae are normal. Pupils are equal, round, and reactive to light.  Neck: Normal range of motion. Neck supple. No JVD present.  Cardiovascular: Normal rate, regular rhythm, S1 normal, S2 normal, normal heart sounds and intact distal pulses.  Exam reveals no gallop and no  friction rub.   No murmur heard. Pulmonary/Chest: Effort normal and breath sounds normal. No respiratory distress. She has no wheezes. She has no rales. She exhibits no tenderness.  Abdominal: Soft. Bowel sounds are normal. She exhibits no distension. There is no tenderness.  Musculoskeletal: Normal range of motion. She exhibits no edema and no tenderness.  Lymphadenopathy:    She has no cervical adenopathy.  Neurological: She is alert and oriented to person, place, and time. Coordination normal.  Skin: Skin is warm and dry. No rash noted. No erythema.  Psychiatric: She has a normal mood and affect. Her behavior is normal. Judgment and thought content normal.    Assessment and Plan

## 2013-05-16 NOTE — Assessment & Plan Note (Signed)
>>  ASSESSMENT AND PLAN FOR OBESE WRITTEN ON 05/16/2013 12:03 PM BY Antonieta Iba, MD  We have encouraged continued exercise, careful diet management in an effort to lose weight.

## 2013-05-16 NOTE — Assessment & Plan Note (Signed)
>>  ASSESSMENT AND PLAN FOR TOBACCO USE DISORDER WRITTEN ON 05/16/2013 12:03 PM BY Antonieta Iba, MD  We have encouraged her to continue to work on weaning her cigarettes and smoking cessation. She will continue to work on this and does not want any assistance with chantix.

## 2013-05-16 NOTE — Assessment & Plan Note (Signed)
Cholesterol is at goal on the current lipid regimen. No changes to the medications were made.  

## 2013-05-16 NOTE — Assessment & Plan Note (Signed)
Blood pressure is well controlled on today's visit. No changes made to the medications. 

## 2013-05-22 DIAGNOSIS — Z23 Encounter for immunization: Secondary | ICD-10-CM | POA: Diagnosis not present

## 2013-05-22 DIAGNOSIS — Z1342 Encounter for screening for global developmental delays (milestones): Secondary | ICD-10-CM | POA: Diagnosis not present

## 2013-05-22 DIAGNOSIS — M549 Dorsalgia, unspecified: Secondary | ICD-10-CM | POA: Diagnosis not present

## 2013-05-22 DIAGNOSIS — Z1330 Encounter for screening examination for mental health and behavioral disorders, unspecified: Secondary | ICD-10-CM | POA: Diagnosis not present

## 2013-05-22 DIAGNOSIS — Z0000 Encounter for general adult medical examination without abnormal findings: Secondary | ICD-10-CM | POA: Diagnosis not present

## 2013-05-22 DIAGNOSIS — Z1331 Encounter for screening for depression: Secondary | ICD-10-CM | POA: Diagnosis not present

## 2013-05-23 DIAGNOSIS — L259 Unspecified contact dermatitis, unspecified cause: Secondary | ICD-10-CM | POA: Diagnosis not present

## 2013-05-23 DIAGNOSIS — D485 Neoplasm of uncertain behavior of skin: Secondary | ICD-10-CM | POA: Diagnosis not present

## 2013-05-23 DIAGNOSIS — L719 Rosacea, unspecified: Secondary | ICD-10-CM | POA: Diagnosis not present

## 2013-05-27 ENCOUNTER — Other Ambulatory Visit: Payer: Medicare Other

## 2013-07-23 ENCOUNTER — Ambulatory Visit
Admission: RE | Admit: 2013-07-23 | Discharge: 2013-07-23 | Disposition: A | Discharge status: Home / Self Care | Payer: Medicare Other | Source: Ambulatory Visit | Attending: Neurosurgery | Admitting: Neurosurgery

## 2013-07-23 VITALS — BP 146/69 | HR 72

## 2013-07-23 DIAGNOSIS — M5126 Other intervertebral disc displacement, lumbar region: Secondary | ICD-10-CM | POA: Diagnosis not present

## 2013-07-23 DIAGNOSIS — M549 Dorsalgia, unspecified: Secondary | ICD-10-CM

## 2013-07-23 DIAGNOSIS — M47817 Spondylosis without myelopathy or radiculopathy, lumbosacral region: Secondary | ICD-10-CM | POA: Diagnosis not present

## 2013-07-23 DIAGNOSIS — M4120 Other idiopathic scoliosis, site unspecified: Secondary | ICD-10-CM | POA: Diagnosis not present

## 2013-07-23 MED ORDER — DIAZEPAM 5 MG PO TABS
5.0000 mg | ORAL_TABLET | Freq: Once | ORAL | Status: AC
  Administered 2013-07-23: 5 mg via ORAL

## 2013-07-23 MED ORDER — IOHEXOL 180 MG/ML  SOLN
15.0000 mL | Freq: Once | INTRAMUSCULAR | Status: AC | PRN
  Administered 2013-07-23: 15 mL via INTRATHECAL

## 2013-07-23 NOTE — Progress Notes (Signed)
Patient states she has been off Plavix for the past five days.  jkl

## 2013-07-23 NOTE — Discharge Instructions (Signed)

## 2013-08-15 DIAGNOSIS — Z6826 Body mass index (BMI) 26.0-26.9, adult: Secondary | ICD-10-CM | POA: Diagnosis not present

## 2013-08-15 DIAGNOSIS — M48061 Spinal stenosis, lumbar region without neurogenic claudication: Secondary | ICD-10-CM | POA: Diagnosis not present

## 2013-08-15 DIAGNOSIS — M4120 Other idiopathic scoliosis, site unspecified: Secondary | ICD-10-CM | POA: Diagnosis not present

## 2013-08-15 DIAGNOSIS — IMO0002 Reserved for concepts with insufficient information to code with codable children: Secondary | ICD-10-CM | POA: Diagnosis not present

## 2013-08-22 DIAGNOSIS — Z872 Personal history of diseases of the skin and subcutaneous tissue: Secondary | ICD-10-CM | POA: Diagnosis not present

## 2013-08-22 DIAGNOSIS — L259 Unspecified contact dermatitis, unspecified cause: Secondary | ICD-10-CM | POA: Diagnosis not present

## 2013-08-22 DIAGNOSIS — L538 Other specified erythematous conditions: Secondary | ICD-10-CM | POA: Diagnosis not present

## 2013-08-26 DIAGNOSIS — Z1342 Encounter for screening for global developmental delays (milestones): Secondary | ICD-10-CM | POA: Diagnosis not present

## 2013-08-26 DIAGNOSIS — Z23 Encounter for immunization: Secondary | ICD-10-CM | POA: Diagnosis not present

## 2013-08-26 DIAGNOSIS — Z0000 Encounter for general adult medical examination without abnormal findings: Secondary | ICD-10-CM | POA: Diagnosis not present

## 2013-08-26 DIAGNOSIS — Z1330 Encounter for screening examination for mental health and behavioral disorders, unspecified: Secondary | ICD-10-CM | POA: Diagnosis not present

## 2013-08-26 DIAGNOSIS — Z1331 Encounter for screening for depression: Secondary | ICD-10-CM | POA: Diagnosis not present

## 2013-08-26 DIAGNOSIS — R1013 Epigastric pain: Secondary | ICD-10-CM | POA: Diagnosis not present

## 2013-09-19 DIAGNOSIS — Z6826 Body mass index (BMI) 26.0-26.9, adult: Secondary | ICD-10-CM | POA: Diagnosis not present

## 2013-09-19 DIAGNOSIS — Z01419 Encounter for gynecological examination (general) (routine) without abnormal findings: Secondary | ICD-10-CM | POA: Diagnosis not present

## 2013-09-19 DIAGNOSIS — Z1231 Encounter for screening mammogram for malignant neoplasm of breast: Secondary | ICD-10-CM | POA: Diagnosis not present

## 2013-09-19 DIAGNOSIS — Z1211 Encounter for screening for malignant neoplasm of colon: Secondary | ICD-10-CM | POA: Diagnosis not present

## 2013-09-19 DIAGNOSIS — Z124 Encounter for screening for malignant neoplasm of cervix: Secondary | ICD-10-CM | POA: Diagnosis not present

## 2013-10-22 DIAGNOSIS — R809 Proteinuria, unspecified: Secondary | ICD-10-CM | POA: Insufficient documentation

## 2013-10-24 DIAGNOSIS — E785 Hyperlipidemia, unspecified: Secondary | ICD-10-CM | POA: Diagnosis not present

## 2013-10-24 DIAGNOSIS — E119 Type 2 diabetes mellitus without complications: Secondary | ICD-10-CM | POA: Diagnosis not present

## 2013-10-30 DIAGNOSIS — M48061 Spinal stenosis, lumbar region without neurogenic claudication: Secondary | ICD-10-CM | POA: Diagnosis not present

## 2013-10-30 DIAGNOSIS — M419 Scoliosis, unspecified: Secondary | ICD-10-CM | POA: Insufficient documentation

## 2013-10-30 DIAGNOSIS — IMO0002 Reserved for concepts with insufficient information to code with codable children: Secondary | ICD-10-CM | POA: Diagnosis not present

## 2013-10-30 DIAGNOSIS — M4310 Spondylolisthesis, site unspecified: Secondary | ICD-10-CM | POA: Diagnosis not present

## 2013-10-30 DIAGNOSIS — M549 Dorsalgia, unspecified: Secondary | ICD-10-CM | POA: Diagnosis not present

## 2013-10-30 DIAGNOSIS — M5416 Radiculopathy, lumbar region: Secondary | ICD-10-CM | POA: Insufficient documentation

## 2013-10-30 DIAGNOSIS — M4150 Other secondary scoliosis, site unspecified: Secondary | ICD-10-CM | POA: Diagnosis not present

## 2013-10-31 DIAGNOSIS — Z1283 Encounter for screening for malignant neoplasm of skin: Secondary | ICD-10-CM | POA: Diagnosis not present

## 2013-10-31 DIAGNOSIS — B079 Viral wart, unspecified: Secondary | ICD-10-CM | POA: Diagnosis not present

## 2013-10-31 DIAGNOSIS — Z872 Personal history of diseases of the skin and subcutaneous tissue: Secondary | ICD-10-CM | POA: Diagnosis not present

## 2013-10-31 DIAGNOSIS — L259 Unspecified contact dermatitis, unspecified cause: Secondary | ICD-10-CM | POA: Diagnosis not present

## 2013-10-31 DIAGNOSIS — E1129 Type 2 diabetes mellitus with other diabetic kidney complication: Secondary | ICD-10-CM | POA: Diagnosis not present

## 2013-10-31 DIAGNOSIS — R809 Proteinuria, unspecified: Secondary | ICD-10-CM | POA: Diagnosis not present

## 2013-10-31 DIAGNOSIS — E785 Hyperlipidemia, unspecified: Secondary | ICD-10-CM | POA: Diagnosis not present

## 2013-11-12 ENCOUNTER — Ambulatory Visit (INDEPENDENT_AMBULATORY_CARE_PROVIDER_SITE_OTHER): Payer: Medicare Other | Admitting: Cardiovascular Disease

## 2013-11-12 ENCOUNTER — Encounter: Payer: Self-pay | Admitting: Cardiovascular Disease

## 2013-11-12 VITALS — BP 120/82 | HR 75 | Ht 65.0 in | Wt 162.0 lb

## 2013-11-12 DIAGNOSIS — E785 Hyperlipidemia, unspecified: Secondary | ICD-10-CM | POA: Diagnosis not present

## 2013-11-12 DIAGNOSIS — I10 Essential (primary) hypertension: Secondary | ICD-10-CM

## 2013-11-12 DIAGNOSIS — F17200 Nicotine dependence, unspecified, uncomplicated: Secondary | ICD-10-CM

## 2013-11-12 DIAGNOSIS — IMO0001 Reserved for inherently not codable concepts without codable children: Secondary | ICD-10-CM

## 2013-11-12 DIAGNOSIS — E669 Obesity, unspecified: Secondary | ICD-10-CM | POA: Diagnosis not present

## 2013-11-12 DIAGNOSIS — M545 Low back pain, unspecified: Secondary | ICD-10-CM

## 2013-11-12 DIAGNOSIS — E1159 Type 2 diabetes mellitus with other circulatory complications: Secondary | ICD-10-CM

## 2013-11-12 DIAGNOSIS — I2510 Atherosclerotic heart disease of native coronary artery without angina pectoris: Secondary | ICD-10-CM | POA: Diagnosis not present

## 2013-11-12 DIAGNOSIS — M549 Dorsalgia, unspecified: Secondary | ICD-10-CM | POA: Insufficient documentation

## 2013-11-12 MED ORDER — NICOTINE 21 MG/24HR TD PT24: 21.0000 mg | MEDICATED_PATCH | Freq: Every day | TRANSDERMAL | Status: DC

## 2013-11-12 NOTE — Assessment & Plan Note (Signed)
>>  ASSESSMENT AND PLAN FOR TOBACCO USE DISORDER WRITTEN ON 11/12/2013  1:53 PM BY Antonieta Iba, MD  We have encouraged her to continue to work on weaning her cigarettes and smoking cessation. She will continue to work on this and does not want any assistance with chantix. We will prescribe nicotine patches

## 2013-11-12 NOTE — Assessment & Plan Note (Signed)
Cholesterol is at goal on the current lipid regimen. No changes to the medications were made.  

## 2013-11-12 NOTE — Progress Notes (Signed)
Patient ID: Hannah Kirby, female    DOB: 1945/06/28, 68 y.o.   MRN: [id number]  HPI Comments: [name] is a  68 year-old woman with a history of diabetes, obesity, significantly improved with recent weight loss on  Victoza, coronary artery disease with stenting of her RCA  in January 2008, long smoking history who continues to smoke, hyperlipidemia who presents for routine followup. outbreak of her granulomatous disease in summary 2013,  prednisone for this condition.  History pinched nerve and is on pain medication.   In followup today, she reports that he feels well. She does have severe low back pain and would like to have surgery. This has been a chronic issue. She has been told that she needs to stop smoking for several weeks prior to surgery as well as after surgery. She is requesting nicotine patch prescription.  She denies any significant shortness of breath or chest discomfort. Recent lab work showing total cholesterol 141, LDL 58, hemoglobin A1c 7.0, creatinine 1.2 Weight has been stable  Weight has been relatively stable. Doing well on her current diabetes regimen eared She continues to smoke. She is working with Dr. Eddie Dibbles on her diabetes. Hemoglobin A1c 6.8. Denies having any chest pain, shortness of breath. Unable to exercise secondary to back pain.   EKG shows normal sinus rhythm , heart rate 75 no significant ST or T wave changes      Outpatient Encounter Prescriptions as of 11/12/2013  Medication Sig  . amLODipine (NORVASC) 5 MG tablet Take 1 tablet (5 mg total) by mouth daily.  Marland Kitchen aspirin (ASPIR-81) 81 MG EC tablet Take 81 mg by mouth daily.    . calcium carbonate (OS-CAL) 600 MG TABS Take 600 mg by mouth daily with breakfast.   . chlorhexidine (PERIDEX) 0.12 % solution Use as directed 15 mLs in the mouth or throat 2 (two) times daily.  . clopidogrel (PLAVIX) 75 MG tablet Take 1 tablet (75 mg total) by mouth daily.  Marland Kitchen HYDROcodone-acetaminophen (NORCO/VICODIN)  5-325 MG per tablet Take 1 tablet by mouth every 6 (six) hours as needed for pain.  . Liraglutide (VICTOZA) 18 MG/3ML SOLN injection Inject 1.2 mg into the skin daily.   Marland Kitchen lisinopril (PRINIVIL,ZESTRIL) 40 MG tablet Take 1 tablet (40 mg total) by mouth daily.  . metFORMIN (GLUMETZA) 1000 MG (MOD) 24 hr tablet Take 1,000 mg by mouth 2 (two) times daily.    . metoprolol tartrate (LOPRESSOR) 25 MG tablet Take 1 tablet (25 mg total) by mouth 2 (two) times daily.  . Multiple Vitamins-Minerals (DAILY MULTI) TABS Take by mouth daily.    . nitroGLYCERIN (NITROSTAT) 0.4 MG SL tablet Place 1 tablet (0.4 mg total) under the tongue every 5 (five) minutes as needed.  . Omega-3 Fatty Acids (FISH OIL) 1000 MG CAPS Take by mouth daily. Take 4   . omeprazole (PRILOSEC) 40 MG capsule Take 1 capsule (40 mg total) by mouth 2 (two) times daily. Take extra cap if more heartburn than ususal  . ondansetron (ZOFRAN) 4 MG tablet Take 4 mg by mouth every 8 (eight) hours as needed for nausea or vomiting.  . rosuvastatin (CRESTOR) 40 MG tablet Take 1 tablet (40 mg total) by mouth daily.   Review of Systems  Constitutional: Negative.   HENT: Negative.   Eyes: Negative.   Respiratory: Negative.   Cardiovascular: Negative.   Gastrointestinal: Negative.   Endocrine: Negative.   Musculoskeletal: Positive for back pain.  Skin: Negative.   Allergic/Immunologic: Negative.  Neurological: Negative.   Hematological: Negative.   Psychiatric/Behavioral: Negative.   All other systems reviewed and are negative.   BP 120/82  Pulse 75  Ht 5\' 5"  (1.651 m)  Wt 162 lb (73.483 kg)  BMI 26.96 kg/m2  Physical Exam  Nursing note and vitals reviewed. Constitutional: She is oriented to person, place, and time. She appears well-developed and well-nourished.  HENT:  Head: Normocephalic.  Nose: Nose normal.  Mouth/Throat: Oropharynx is clear and moist.  Eyes: Conjunctivae are normal. Pupils are equal, round, and reactive to light.   Neck: Normal range of motion. Neck supple. No JVD present.  Cardiovascular: Normal rate, regular rhythm, S1 normal, S2 normal, normal heart sounds and intact distal pulses.  Exam reveals no gallop and no friction rub.   No murmur heard. Pulmonary/Chest: Effort normal and breath sounds normal. No respiratory distress. She has no wheezes. She has no rales. She exhibits no tenderness.  Abdominal: Soft. Bowel sounds are normal. She exhibits no distension. There is no tenderness.  Musculoskeletal: Normal range of motion. She exhibits no edema and no tenderness.  Lymphadenopathy:    She has no cervical adenopathy.  Neurological: She is alert and oriented to person, place, and time. Coordination normal.  Skin: Skin is warm and dry. No rash noted. No erythema.  Psychiatric: She has a normal mood and affect. Her behavior is normal. Judgment and thought content normal.    Assessment and Plan

## 2013-11-12 NOTE — Assessment & Plan Note (Signed)
We have encouraged her to continue to work on weaning her cigarettes and smoking cessation. She will continue to work on this and does not want any assistance with chantix. We will prescribe nicotine patches

## 2013-11-12 NOTE — Assessment & Plan Note (Signed)
Blood pressure is well controlled on today's visit. No changes made to the medications. 

## 2013-11-12 NOTE — Assessment & Plan Note (Signed)
Acceptable risk for back surgery. Okay to stop aspirin and Plavix prior to the surgery. Acceptable risk. No further testing needed

## 2013-11-12 NOTE — Assessment & Plan Note (Signed)
Weight has been trending down, stable over the past 6 months

## 2013-11-12 NOTE — Assessment & Plan Note (Signed)
>>  ASSESSMENT AND PLAN FOR OBESE WRITTEN ON 11/12/2013  1:53 PM BY Antonieta Iba, MD  Weight has been trending down, stable over the past 6 months

## 2013-11-12 NOTE — Assessment & Plan Note (Signed)
Currently with no symptoms of angina. No further workup at this time. Continue current medication regimen. 

## 2013-11-12 NOTE — Patient Instructions (Signed)
You are doing well. No medication changes were made.  Please call us if you have new issues that need to be addressed before your next appt.  Your physician wants you to follow-up in: 6 months.  You will receive a reminder letter in the mail two months in advance. If you don't receive a letter, please call our office to schedule the follow-up appointment.   

## 2013-11-12 NOTE — Assessment & Plan Note (Signed)
She has been working with endocrine. Hemoglobin A1c 7

## 2013-11-15 DIAGNOSIS — M4120 Other idiopathic scoliosis, site unspecified: Secondary | ICD-10-CM | POA: Diagnosis not present

## 2013-11-15 DIAGNOSIS — M549 Dorsalgia, unspecified: Secondary | ICD-10-CM | POA: Diagnosis not present

## 2013-11-15 DIAGNOSIS — IMO0002 Reserved for concepts with insufficient information to code with codable children: Secondary | ICD-10-CM | POA: Diagnosis not present

## 2013-11-15 DIAGNOSIS — M48061 Spinal stenosis, lumbar region without neurogenic claudication: Secondary | ICD-10-CM | POA: Diagnosis not present

## 2013-11-18 ENCOUNTER — Encounter: Payer: Self-pay | Admitting: Cardiovascular Disease

## 2013-11-22 DIAGNOSIS — Z720 Tobacco use: Secondary | ICD-10-CM | POA: Diagnosis not present

## 2013-11-22 DIAGNOSIS — M549 Dorsalgia, unspecified: Secondary | ICD-10-CM | POA: Diagnosis not present

## 2013-11-22 DIAGNOSIS — Z23 Encounter for immunization: Secondary | ICD-10-CM | POA: Diagnosis not present

## 2013-11-22 DIAGNOSIS — K219 Gastro-esophageal reflux disease without esophagitis: Secondary | ICD-10-CM | POA: Diagnosis not present

## 2013-11-22 DIAGNOSIS — J449 Chronic obstructive pulmonary disease, unspecified: Secondary | ICD-10-CM | POA: Diagnosis not present

## 2014-01-17 DIAGNOSIS — J441 Chronic obstructive pulmonary disease with (acute) exacerbation: Secondary | ICD-10-CM | POA: Diagnosis not present

## 2014-01-22 DIAGNOSIS — M549 Dorsalgia, unspecified: Secondary | ICD-10-CM | POA: Diagnosis not present

## 2014-01-22 DIAGNOSIS — Z23 Encounter for immunization: Secondary | ICD-10-CM | POA: Diagnosis not present

## 2014-01-22 DIAGNOSIS — J449 Chronic obstructive pulmonary disease, unspecified: Secondary | ICD-10-CM | POA: Diagnosis not present

## 2014-01-22 DIAGNOSIS — K219 Gastro-esophageal reflux disease without esophagitis: Secondary | ICD-10-CM | POA: Diagnosis not present

## 2014-01-22 DIAGNOSIS — Z720 Tobacco use: Secondary | ICD-10-CM | POA: Diagnosis not present

## 2014-02-07 DIAGNOSIS — K219 Gastro-esophageal reflux disease without esophagitis: Secondary | ICD-10-CM | POA: Diagnosis not present

## 2014-02-07 DIAGNOSIS — Z720 Tobacco use: Secondary | ICD-10-CM | POA: Diagnosis not present

## 2014-02-07 DIAGNOSIS — J449 Chronic obstructive pulmonary disease, unspecified: Secondary | ICD-10-CM | POA: Diagnosis not present

## 2014-02-07 DIAGNOSIS — Z23 Encounter for immunization: Secondary | ICD-10-CM | POA: Diagnosis not present

## 2014-02-07 DIAGNOSIS — M549 Dorsalgia, unspecified: Secondary | ICD-10-CM | POA: Diagnosis not present

## 2014-02-18 DIAGNOSIS — M4120 Other idiopathic scoliosis, site unspecified: Secondary | ICD-10-CM | POA: Diagnosis not present

## 2014-02-18 DIAGNOSIS — Z6827 Body mass index (BMI) 27.0-27.9, adult: Secondary | ICD-10-CM | POA: Diagnosis not present

## 2014-02-18 DIAGNOSIS — M545 Low back pain: Secondary | ICD-10-CM | POA: Diagnosis not present

## 2014-02-19 DIAGNOSIS — R040 Epistaxis: Secondary | ICD-10-CM | POA: Diagnosis not present

## 2014-02-19 DIAGNOSIS — J069 Acute upper respiratory infection, unspecified: Secondary | ICD-10-CM | POA: Diagnosis not present

## 2014-04-24 DIAGNOSIS — E1121 Type 2 diabetes mellitus with diabetic nephropathy: Secondary | ICD-10-CM | POA: Diagnosis not present

## 2014-05-01 DIAGNOSIS — R809 Proteinuria, unspecified: Secondary | ICD-10-CM | POA: Diagnosis not present

## 2014-05-01 DIAGNOSIS — E781 Pure hyperglyceridemia: Secondary | ICD-10-CM | POA: Diagnosis not present

## 2014-05-01 DIAGNOSIS — E1121 Type 2 diabetes mellitus with diabetic nephropathy: Secondary | ICD-10-CM | POA: Diagnosis not present

## 2014-05-01 DIAGNOSIS — E785 Hyperlipidemia, unspecified: Secondary | ICD-10-CM | POA: Diagnosis not present

## 2014-05-14 ENCOUNTER — Ambulatory Visit (INDEPENDENT_AMBULATORY_CARE_PROVIDER_SITE_OTHER): Payer: Medicare Other | Admitting: Cardiovascular Disease

## 2014-05-14 ENCOUNTER — Encounter: Payer: Self-pay | Admitting: Cardiovascular Disease

## 2014-05-14 VITALS — BP 100/60 | HR 76 | Ht 66.0 in | Wt 159.2 lb

## 2014-05-14 DIAGNOSIS — I10 Essential (primary) hypertension: Secondary | ICD-10-CM

## 2014-05-14 DIAGNOSIS — F17200 Nicotine dependence, unspecified, uncomplicated: Secondary | ICD-10-CM

## 2014-05-14 DIAGNOSIS — Z720 Tobacco use: Secondary | ICD-10-CM

## 2014-05-14 DIAGNOSIS — E785 Hyperlipidemia, unspecified: Secondary | ICD-10-CM

## 2014-05-14 DIAGNOSIS — M545 Low back pain: Secondary | ICD-10-CM

## 2014-05-14 DIAGNOSIS — E1159 Type 2 diabetes mellitus with other circulatory complications: Secondary | ICD-10-CM

## 2014-05-14 NOTE — Patient Instructions (Signed)
You are doing well.  Blood pressure is running low, Please hold the amlodipine Track your blood pressure, If it continues to run low, call the office   Please call us if you have new issues that need to be addressed before your next appt.  Your physician wants you to follow-up in: 6 months.  You will receive a reminder letter in the mail two months in advance. If you don't receive a letter, please call our office to schedule the follow-up appointment.

## 2014-05-14 NOTE — Assessment & Plan Note (Signed)
We have encouraged her to continue to work on weaning her cigarettes and smoking cessation. She will continue to work on this and does not want any assistance with chantix.  

## 2014-05-14 NOTE — Assessment & Plan Note (Signed)
Cholesterol is at goal on the current lipid regimen. No changes to the medications were made.  

## 2014-05-14 NOTE — Assessment & Plan Note (Signed)
>>  ASSESSMENT AND PLAN FOR TOBACCO USE DISORDER WRITTEN ON 05/14/2014  1:41 PM BY Antonieta Iba, MD  We have encouraged her to continue to work on weaning her cigarettes and smoking cessation. She will continue to work on this and does not want any assistance with chantix.

## 2014-05-14 NOTE — Progress Notes (Signed)
Patient ID: Hannah Kirby, female    DOB: 1945/04/02, 69 y.o.   MRN: [id number]  HPI Comments: [name] is a  69 year-old woman with a history of diabetes, obesity, significantly improved with recent weight loss on  Victoza, coronary artery disease with stenting of her RCA  in January 2008, long smoking history who continues to smoke, hyperlipidemia who presents for routine followup of her coronary artery disease. outbreak of her granulomatous disease in summary 2013,  prednisone for this condition.  History pinched nerve and is on pain medication.   In followup today, she reports that he feels well.  She does continue to have severe low back pain. She's not sure if she wants surgery. This has been a chronic issue. She has been told that she needs to stop smoking for several weeks prior to surgery as well as after surgery.  She continues to smoke daily Weight is down significantly, a pressure also running low or with periods of dizziness and lightheadedness when standing Recent lab work showing total cholesterol 141, hemoglobin A1c 6.8 She denies any significant shortness of breath or chest discomfort.   EKG on today's visit shows normal sinus rhythm , heart rate 76 no significant ST or T wave changes   She is working with Dr. Eddie Dibbles on her diabetes.        Allergies  Allergen Reactions  . Sulfonamide Derivatives Other (See Comments)    Last taken as a child; made her mouth break out  . Codeine Nausea Only  . Prednisone Palpitations and Other (See Comments)    Made her legs "feel weird."    Outpatient Encounter Prescriptions as of 68/23/2016  Medication Sig  . amLODipine (NORVASC) 5 MG tablet Take 1 tablet (5 mg total) by mouth daily.  Marland Kitchen aspirin (ASPIR-81) 81 MG EC tablet Take 81 mg by mouth daily.    . calcium carbonate (OS-CAL) 600 MG TABS Take 600 mg by mouth daily with breakfast.   . chlorhexidine (PERIDEX) 0.12 % solution Use as directed 15 mLs in the mouth or  throat 2 (two) times daily.  . clopidogrel (PLAVIX) 75 MG tablet Take 1 tablet (75 mg total) by mouth daily.  Marland Kitchen HYDROcodone-acetaminophen (NORCO/VICODIN) 5-325 MG per tablet Take 1 tablet by mouth every 6 (six) hours as needed for pain.  . Liraglutide (VICTOZA) 18 MG/3ML SOLN injection Inject 1.2 mg into the skin daily.   Marland Kitchen lisinopril (PRINIVIL,ZESTRIL) 40 MG tablet Take 1 tablet (40 mg total) by mouth daily.  . metFORMIN (GLUMETZA) 1000 MG (MOD) 24 hr tablet Take 1,000 mg by mouth 2 (two) times daily.    . metoprolol tartrate (LOPRESSOR) 25 MG tablet Take 1 tablet (25 mg total) by mouth 2 (two) times daily.  . Multiple Vitamins-Minerals (DAILY MULTI) TABS Take by mouth daily.    . nicotine (NICODERM CQ - DOSED IN MG/24 HOURS) 21 mg/24hr patch Place 1 patch (21 mg total) onto the skin daily.  . nitroGLYCERIN (NITROSTAT) 0.4 MG SL tablet Place 1 tablet (0.4 mg total) under the tongue every 5 (five) minutes as needed.  . Omega-3 Fatty Acids (FISH OIL) 1000 MG CAPS Take by mouth daily. Take 4   . omeprazole (PRILOSEC) 40 MG capsule Take 1 capsule (40 mg total) by mouth 2 (two) times daily. Take extra cap if more heartburn than ususal  . ondansetron (ZOFRAN) 4 MG tablet Take 4 mg by mouth every 8 (eight) hours as needed for nausea or vomiting.  Marland Kitchen  rosuvastatin (CRESTOR) 40 MG tablet Take 1 tablet (40 mg total) by mouth daily.  Marland Kitchen tiotropium (SPIRIVA) 18 MCG inhalation capsule Place 18 mcg into inhaler and inhale daily.    Past Medical History  Diagnosis Date  . GERD (gastroesophageal reflux disease)   . Hyperlipidemia   . Hypertension   . Hernia   . FHx: allergies   . Diabetes mellitus   . Arthritis   . Kidney stone on left side 2013  . Chronic bronchitis     secondary to cigarette smoking  . Goiter   . Microalbuminuria   . Spinal stenosis     Past Surgical History  Procedure Laterality Date  . Hysterectomy (other)    . Breast biopsy      Social History  reports that she has been  smoking Cigarettes.  She has a 40 pack-year smoking history. She does not have any smokeless tobacco history on file. She reports that she drinks alcohol. She reports that she does not use illicit drugs.  Family History family history includes Heart failure in her mother; Lung cancer in her father; Stroke in her mother.   Review of Systems  Constitutional: Negative.   Respiratory: Negative.   Cardiovascular: Negative.   Gastrointestinal: Negative.   Musculoskeletal: Positive for back pain.  Skin: Negative.   Neurological: Negative.   Hematological: Negative.   Psychiatric/Behavioral: Negative.   All other systems reviewed and are negative.   BP 100/60 mmHg  Pulse 76  Ht 5\' 6"  (1.676 m)  Wt 159 lb 4 oz (72.235 kg)  BMI 25.72 kg/m2  Physical Exam  Constitutional: She is oriented to person, place, and time. She appears well-developed and well-nourished.  HENT:  Head: Normocephalic.  Nose: Nose normal.  Mouth/Throat: Oropharynx is clear and moist.  Eyes: Conjunctivae are normal. Pupils are equal, round, and reactive to light.  Neck: Normal range of motion. Neck supple. No JVD present.  Cardiovascular: Normal rate, regular rhythm, S1 normal, S2 normal, normal heart sounds and intact distal pulses.  Exam reveals no gallop and no friction rub.   No murmur heard. Pulmonary/Chest: Effort normal and breath sounds normal. No respiratory distress. She has no wheezes. She has no rales. She exhibits no tenderness.  Abdominal: Soft. Bowel sounds are normal. She exhibits no distension. There is no tenderness.  Musculoskeletal: Normal range of motion. She exhibits no edema or tenderness.  Lymphadenopathy:    She has no cervical adenopathy.  Neurological: She is alert and oriented to person, place, and time. Coordination normal.  Skin: Skin is warm and dry. No rash noted. No erythema.  Psychiatric: She has a normal mood and affect. Her behavior is normal. Judgment and thought content normal.     Assessment and Plan  Nursing note and vitals reviewed.

## 2014-05-14 NOTE — Assessment & Plan Note (Signed)
Acceptable risk for back surgery. She is uncertain if she would like back surgery. She's willing to work to the pain at this time

## 2014-05-14 NOTE — Assessment & Plan Note (Signed)
Blood pressures running low. Recommended that she hold the amlodipine and monitor her pressure

## 2014-05-14 NOTE — Assessment & Plan Note (Signed)
She has been working with endocrine. Hemoglobin A1c 6.8

## 2014-06-17 DIAGNOSIS — M4120 Other idiopathic scoliosis, site unspecified: Secondary | ICD-10-CM | POA: Diagnosis not present

## 2014-06-17 DIAGNOSIS — Z6826 Body mass index (BMI) 26.0-26.9, adult: Secondary | ICD-10-CM | POA: Diagnosis not present

## 2014-06-17 DIAGNOSIS — M545 Low back pain: Secondary | ICD-10-CM | POA: Diagnosis not present

## 2014-07-18 DIAGNOSIS — J449 Chronic obstructive pulmonary disease, unspecified: Secondary | ICD-10-CM | POA: Diagnosis not present

## 2014-07-18 DIAGNOSIS — Z720 Tobacco use: Secondary | ICD-10-CM | POA: Diagnosis not present

## 2014-07-18 DIAGNOSIS — K219 Gastro-esophageal reflux disease without esophagitis: Secondary | ICD-10-CM | POA: Diagnosis not present

## 2014-07-18 DIAGNOSIS — Z23 Encounter for immunization: Secondary | ICD-10-CM | POA: Diagnosis not present

## 2014-07-18 DIAGNOSIS — Z0000 Encounter for general adult medical examination without abnormal findings: Secondary | ICD-10-CM | POA: Diagnosis not present

## 2014-07-24 ENCOUNTER — Other Ambulatory Visit: Payer: Self-pay

## 2014-07-24 ENCOUNTER — Emergency Department
Admission: EM | Admit: 2014-07-24 | Discharge: 2014-07-24 | Disposition: A | Discharge status: Home / Self Care | Payer: Medicare Other | Attending: Emergency Medicine | Admitting: Emergency Medicine

## 2014-07-24 ENCOUNTER — Encounter: Payer: Self-pay | Admitting: Emergency Medicine

## 2014-07-24 ENCOUNTER — Emergency Department: Payer: Medicare Other

## 2014-07-24 DIAGNOSIS — I10 Essential (primary) hypertension: Secondary | ICD-10-CM | POA: Insufficient documentation

## 2014-07-24 DIAGNOSIS — Z7902 Long term (current) use of antithrombotics/antiplatelets: Secondary | ICD-10-CM | POA: Insufficient documentation

## 2014-07-24 DIAGNOSIS — Z7982 Long term (current) use of aspirin: Secondary | ICD-10-CM | POA: Insufficient documentation

## 2014-07-24 DIAGNOSIS — R911 Solitary pulmonary nodule: Secondary | ICD-10-CM

## 2014-07-24 DIAGNOSIS — Z79899 Other long term (current) drug therapy: Secondary | ICD-10-CM | POA: Insufficient documentation

## 2014-07-24 DIAGNOSIS — R0789 Other chest pain: Secondary | ICD-10-CM | POA: Diagnosis present

## 2014-07-24 DIAGNOSIS — R918 Other nonspecific abnormal finding of lung field: Secondary | ICD-10-CM | POA: Diagnosis not present

## 2014-07-24 DIAGNOSIS — N289 Disorder of kidney and ureter, unspecified: Secondary | ICD-10-CM | POA: Insufficient documentation

## 2014-07-24 DIAGNOSIS — Z720 Tobacco use: Secondary | ICD-10-CM | POA: Insufficient documentation

## 2014-07-24 DIAGNOSIS — E119 Type 2 diabetes mellitus without complications: Secondary | ICD-10-CM | POA: Diagnosis not present

## 2014-07-24 DIAGNOSIS — R079 Chest pain, unspecified: Secondary | ICD-10-CM

## 2014-07-24 LAB — CBC
HCT: 41 % (ref 35.0–47.0)
Hemoglobin: 13.5 g/dL (ref 12.0–16.0)
MCH: 30.1 pg (ref 26.0–34.0)
MCHC: 33 g/dL (ref 32.0–36.0)
MCV: 91.2 fL (ref 80.0–100.0)
PLATELETS: 214 10*3/uL (ref 150–440)
RBC: 4.5 MIL/uL (ref 3.80–5.20)
RDW: 15.1 % — ABNORMAL HIGH (ref 11.5–14.5)
WBC: 8.8 10*3/uL (ref 3.6–11.0)

## 2014-07-24 LAB — URINALYSIS COMPLETE WITH MICROSCOPIC (ARMC ONLY)
BACTERIA UA: NONE SEEN
BILIRUBIN URINE: NEGATIVE
Glucose, UA: NEGATIVE mg/dL
HGB URINE DIPSTICK: NEGATIVE
Ketones, ur: NEGATIVE mg/dL
LEUKOCYTES UA: NEGATIVE
Nitrite: NEGATIVE
PH: 5 (ref 5.0–8.0)
PROTEIN: 100 mg/dL — AB
SPECIFIC GRAVITY, URINE: 1.026 (ref 1.005–1.030)

## 2014-07-24 LAB — TROPONIN I: Troponin I: <0.03 ng/mL (ref <0.031)

## 2014-07-24 LAB — BASIC METABOLIC PANEL
Anion gap: 9 (ref 5–15)
BUN: 38 mg/dL — ABNORMAL HIGH (ref 6–20)
CALCIUM: 9.5 mg/dL (ref 8.9–10.3)
CO2: 25 mmol/L (ref 22–32)
CREATININE: 1.72 mg/dL — AB (ref 0.44–1.00)
Chloride: 106 mmol/L (ref 101–111)
GFR calc Af Amer: 34 mL/min — ABNORMAL LOW (ref >60)
GFR calc non Af Amer: 29 mL/min — ABNORMAL LOW (ref >60)
GLUCOSE: 154 mg/dL — AB (ref 65–99)
Potassium: 4 mmol/L (ref 3.5–5.1)
Sodium: 140 mmol/L (ref 135–145)

## 2014-07-24 MED ORDER — ASPIRIN 81 MG PO CHEW
324.0000 mg | CHEWABLE_TABLET | Freq: Once | ORAL | Status: AC
  Administered 2014-07-24: 324 mg via ORAL

## 2014-07-24 MED ORDER — SODIUM CHLORIDE 0.9 % IV BOLUS (SEPSIS)
1000.0000 mL | Freq: Once | INTRAVENOUS | Status: AC
  Administered 2014-07-24: 1000 mL via INTRAVENOUS

## 2014-07-24 MED ORDER — ASPIRIN 81 MG PO CHEW
CHEWABLE_TABLET | ORAL | Status: AC
  Administered 2014-07-24: 324 mg via ORAL
  Filled 2014-07-24: qty 4

## 2014-07-24 MED ORDER — ASPIRIN 325 MG PO TABS
325.0000 mg | ORAL_TABLET | ORAL | Status: DC
  Filled 2014-07-24: qty 1

## 2014-07-24 NOTE — ED Provider Notes (Signed)
CSN: [id number]     Arrival date & time 07/24/14  1245 History   First MD Initiated Contact with Patient 07/24/14 1517     Chief Complaint  Patient presents with  . Chest Pain     (Consider location/radiation/quality/duration/timing/severity/associated sxs/prior Treatment) The history is provided by the patient.  Hannah Kirby is a 69 y.o. female hx of GERD, HL, HTN, CAD s/p stent, here with chest pain. Substernal chest pain around noon when she was laying down. She felt like indigestion and took antacid with minimal relief. Denies any associated shortness of breath. States that it felt different than her previous heart attack in 2008. She has been seeing Dr. Saundra Shelling since then. Has still been taking aspirin and Plavix. Given 4 baby aspirin in the ED and is now pain free.    Past Medical History  Diagnosis Date  . GERD (gastroesophageal reflux disease)   . Hyperlipidemia   . Hypertension   . Hernia   . FHx: allergies   . Diabetes mellitus   . Arthritis   . Kidney stone on left side 2013  . Chronic bronchitis     secondary to cigarette smoking  . Goiter   . Microalbuminuria   . Spinal stenosis    Past Surgical History  Procedure Laterality Date  . Hysterectomy (other)    . Breast biopsy    . Coronary angioplasty with stent placement  2008   Family History  Problem Relation Age of Onset  . Heart failure Mother   . Stroke Mother   . Lung cancer Father    History  Substance Use Topics  . Smoking status: Current Every Day Smoker -- 1.00 packs/day for 40 years    Types: Cigarettes    Last Attempt to Quit: 01/21/2009  . Smokeless tobacco: Not on file  . Alcohol Use: Yes   OB History    No data available     Review of Systems  Cardiovascular: Positive for chest pain.  All other systems reviewed and are negative.     Allergies  Sulfonamide derivatives; Codeine; and Prednisone  Home Medications   Prior to Admission medications   Medication Sig Start Date End  Date Taking? Authorizing Provider  albuterol (PROVENTIL HFA;VENTOLIN HFA) 108 (90 BASE) MCG/ACT inhaler Inhale 1 puff into the lungs 2 (two) times daily as needed for wheezing or shortness of breath.   Yes Historical Provider, MD  aspirin (ASPIR-81) 81 MG EC tablet Take 81 mg by mouth daily.     Yes Historical Provider, MD  calcium carbonate (OS-CAL) 600 MG TABS Take 600 mg by mouth every evening.    Yes Historical Provider, MD  clopidogrel (PLAVIX) 75 MG tablet Take 1 tablet (75 mg total) by mouth daily. 11/16/12  Yes Minna Merritts, MD  HYDROcodone-acetaminophen (NORCO/VICODIN) 5-325 MG per tablet Take 1 tablet by mouth every 6 (six) hours as needed for pain.   Yes Historical Provider, MD  Liraglutide (VICTOZA) 18 MG/3ML SOLN injection Inject 1.2 mg into the skin daily.    Yes Historical Provider, MD  lisinopril (PRINIVIL,ZESTRIL) 40 MG tablet Take 1 tablet (40 mg total) by mouth daily. 11/16/12  Yes Minna Merritts, MD  metFORMIN (GLUMETZA) 1000 MG (MOD) 24 hr tablet Take 1,000 mg by mouth 2 (two) times daily.     Yes Historical Provider, MD  metoprolol tartrate (LOPRESSOR) 25 MG tablet Take 1 tablet (25 mg total) by mouth 2 (two) times daily. 11/16/12  Yes Minna Merritts, MD  Multiple Vitamins-Minerals (DAILY MULTI) TABS Take 1 tablet by mouth daily.    Yes Historical Provider, MD  Omega-3 Fatty Acids (FISH OIL) 1000 MG CAPS Take 2 capsules by mouth 2 (two) times daily. Take 4   Yes Historical Provider, MD  omeprazole (PRILOSEC) 40 MG capsule Take 1 capsule (40 mg total) by mouth 2 (two) times daily. Take extra cap if more heartburn than ususal 11/16/12  Yes Minna Merritts, MD  rosuvastatin (CRESTOR) 40 MG tablet Take 1 tablet (40 mg total) by mouth daily. 11/16/12  Yes Minna Merritts, MD  tiotropium (SPIRIVA) 18 MCG inhalation capsule Place 18 mcg into inhaler and inhale daily.   Yes Historical Provider, MD  amLODipine (NORVASC) 5 MG tablet Take 1 tablet (5 mg total) by mouth daily. Patient  not taking: Reported on 07/24/2014 11/16/12   Minna Merritts, MD  chlorhexidine (PERIDEX) 0.12 % solution Use as directed 15 mLs in the mouth or throat 2 (two) times daily. 08/18/10   Minna Merritts, MD  nicotine (NICODERM CQ - DOSED IN MG/24 HOURS) 21 mg/24hr patch Place 1 patch (21 mg total) onto the skin daily. Patient not taking: Reported on 07/24/2014 11/12/13   Minna Merritts, MD  nitroGLYCERIN (NITROSTAT) 0.4 MG SL tablet Place 1 tablet (0.4 mg total) under the tongue every 5 (five) minutes as needed. 05/16/13   Minna Merritts, MD  ondansetron (ZOFRAN) 4 MG tablet Take 4 mg by mouth every 8 (eight) hours as needed for nausea or vomiting.    Historical Provider, MD   BP 180/95 mmHg  Pulse 65  Temp(Src) 98.1 F (36.7 C) (Oral)  Resp 18  Ht 5\' 5"  (1.651 m)  Wt 159 lb (72.122 kg)  BMI 26.46 kg/m2  SpO2 93% Physical Exam  Constitutional: She is oriented to person, place, and time. She appears well-developed and well-nourished.  HENT:  Head: Normocephalic.  Mouth/Throat: Oropharynx is clear and moist.  Eyes: Conjunctivae are normal. Pupils are equal, round, and reactive to light.  Neck: Normal range of motion. Neck supple.  Cardiovascular: Normal rate, regular rhythm and normal heart sounds.   Pulmonary/Chest: Effort normal and breath sounds normal. No respiratory distress. She has no wheezes. She has no rales. She exhibits no tenderness.  Abdominal: Soft. Bowel sounds are normal. She exhibits no distension. There is no tenderness. There is no rebound and no guarding.  Musculoskeletal: Normal range of motion. She exhibits no edema or tenderness.  Neurological: She is alert and oriented to person, place, and time. No cranial nerve deficit.  Skin: Skin is warm and dry.  Psychiatric: She has a normal mood and affect. Her behavior is normal. Judgment and thought content normal.  Nursing note and vitals reviewed.   ED Course  Procedures (including critical care time) Labs Review Labs  Reviewed  BASIC METABOLIC PANEL - Abnormal; Notable for the following:    Glucose, Bld 154 (*)    BUN 38 (*)    Creatinine, Ser 1.72 (*)    GFR calc non Af Amer 29 (*)    GFR calc Af Amer 34 (*)    All other components within normal limits  CBC - Abnormal; Notable for the following:    RDW 15.1 (*)    All other components within normal limits  URINALYSIS COMPLETEWITH MICROSCOPIC ([facility] ONLY) - Abnormal; Notable for the following:    Color, Urine YELLOW (*)    APPearance CLEAR (*)    Protein, ur 100 (*)  Squamous Epithelial / LPF 0-5 (*)    All other components within normal limits  TROPONIN I  TROPONIN I    Imaging Review Dg Chest 2 View  07/24/2014   CLINICAL DATA:  69 year old female with a history of central chest pain and lightheadedness.  EXAM: CHEST - 2 VIEW  COMPARISON:  Plain film 08/24/2011  FINDINGS: Cardiomediastinal silhouette unchanged. Atherosclerotic calcifications of the aortic arch.  No pneumothorax or pleural effusion.  No confluent airspace disease.  Nodular density in the left hilar region, not present on the comparison plain film.  Mild degenerative changes of the thoracic spine.  Unremarkable appearance of the upper abdomen.  IMPRESSION: No radiographic evidence of acute cardiopulmonary disease.  Nodular density in the left hilar region, new from comparison plain film of 2013. This may represent partially calcified lymph nodes of the hilum, however, noncontrast CT of the chest is recommended to evaluate for potential pulmonary nodule.  Signed,  Dulcy Fanny. Earleen Newport, DO  [facility]  [facility]   Electronically Signed   By: Corrie Mckusick D.O.   On: 07/24/2014 14:15     EKG Interpretation None       <ECG>  ED ECG REPORT   Date: 07/24/2014  EKG Time: 6:17 PM  Rate: 75  Rhythm: normal sinus rhythm,  normal EKG, normal sinus rhythm, unchanged from previous tracings  Axis: normal  Intervals:none  ST&T Change:  nonspecific  Narrative Interpretation:              MDM   Final diagnoses:  Renal insufficiency  Lung nodule    Hannah Kirby is a 69 y.o. female here with chest pain. Pain free now. States that different than previous MI. Comfortable now. Will get delta trop and consult cardiology.   6:17 PM Cr. 1.7, elevated compared to baseline. Given IVF. Delta trop neg. Discussed with Dr. Rayann Heman from cardiology, who recommend f/u with cardiology. CXR showed lung nodule, will have her see PCP for follow up.      Wandra Arthurs, MD 07/24/14 [phone]

## 2014-07-24 NOTE — ED Notes (Signed)
Patient presents to the ED with centralized chest pain and lightheadedness for about 1 hour.  Patient reports history of cardiac stents.  Patient is in no obvious distress at this time.  Patient denies any radiation of the pain.

## 2014-07-24 NOTE — Discharge Instructions (Signed)
Stay hydrated.   Call Dr. Donivan Scull office for follow up.   See your primary care doctor to recheck kidney function in a week.   You have a lung nodule that needs to be followed up with your primary care doctor. You may need a CT scan.   Return to ER if you have worse chest pain, shortness of breath.

## 2014-07-25 ENCOUNTER — Telehealth: Payer: Self-pay | Admitting: Family Medicine

## 2014-07-25 ENCOUNTER — Ambulatory Visit (INDEPENDENT_AMBULATORY_CARE_PROVIDER_SITE_OTHER): Payer: Medicare Other | Admitting: Physician Assistant

## 2014-07-25 ENCOUNTER — Encounter: Payer: Self-pay | Admitting: Physician Assistant

## 2014-07-25 VITALS — BP 130/82 | HR 71 | Ht 66.0 in | Wt 158.8 lb

## 2014-07-25 DIAGNOSIS — K219 Gastro-esophageal reflux disease without esophagitis: Secondary | ICD-10-CM

## 2014-07-25 DIAGNOSIS — E669 Obesity, unspecified: Secondary | ICD-10-CM

## 2014-07-25 DIAGNOSIS — R079 Chest pain, unspecified: Secondary | ICD-10-CM | POA: Diagnosis not present

## 2014-07-25 DIAGNOSIS — Z720 Tobacco use: Secondary | ICD-10-CM

## 2014-07-25 DIAGNOSIS — D71 Functional disorders of polymorphonuclear neutrophils: Secondary | ICD-10-CM

## 2014-07-25 DIAGNOSIS — I2510 Atherosclerotic heart disease of native coronary artery without angina pectoris: Secondary | ICD-10-CM | POA: Diagnosis not present

## 2014-07-25 DIAGNOSIS — I10 Essential (primary) hypertension: Secondary | ICD-10-CM | POA: Diagnosis not present

## 2014-07-25 DIAGNOSIS — E785 Hyperlipidemia, unspecified: Secondary | ICD-10-CM

## 2014-07-25 DIAGNOSIS — F17200 Nicotine dependence, unspecified, uncomplicated: Secondary | ICD-10-CM

## 2014-07-25 DIAGNOSIS — E1159 Type 2 diabetes mellitus with other circulatory complications: Secondary | ICD-10-CM

## 2014-07-25 DIAGNOSIS — L920 Granuloma annulare: Secondary | ICD-10-CM | POA: Insufficient documentation

## 2014-07-25 MED ORDER — RANITIDINE HCL 150 MG PO TABS: 150.0000 mg | ORAL_TABLET | Freq: Two times a day (BID) | ORAL | Status: DC

## 2014-07-25 NOTE — Telephone Encounter (Signed)
Pt is scheduled for ER Follow up for Monday 07/28/14 @ 8:15. Thanks TNP

## 2014-07-25 NOTE — Patient Instructions (Addendum)
Medication Instructions:  Please start Zantac 150 mg once daily Keep your GI appt as scheduled  Labwork: None  Testing/Procedures: [facility]  Your caregiver has ordered a Stress Test with nuclear imaging. The purpose of this test is to evaluate the blood supply to your heart muscle. This procedure is referred to as a "Non-Invasive Stress Test." This is because other than having an IV started in your vein, nothing is inserted or "invades" your body. Cardiac stress tests are done to find areas of poor blood flow to the heart by determining the extent of coronary artery disease (CAD). Some patients exercise on a treadmill, which naturally increases the blood flow to your heart, while others who are  unable to walk on a treadmill due to physical limitations have a pharmacologic/chemical stress agent called Lexiscan . This medicine will mimic walking on a treadmill by temporarily increasing your coronary blood flow.   Please note: these test may take anywhere between 2-4 hours to complete  PLEASE REPORT TO [facility] AT THE FIRST DESK WILL DIRECT YOU WHERE TO GO  Date of Procedure:_____Tuesday, June 7____  Arrival Time for Procedure:_____7:15 am_____  Instructions regarding medication:   __X__ : Hold diabetes medication morning of procedure  __X__:  Hold METOPROLOL the night before procedure and morning of procedure  __X__:  Hold other medications as follows:_______Do not take METFORMIN for 24 hrs before and for 48 hrs following your procedure____  PLEASE NOTIFY THE OFFICE AT LEAST 24 HOURS IN ADVANCE IF YOU ARE UNABLE TO KEEP YOUR APPOINTMENT.  [phone] AND  PLEASE NOTIFY NUCLEAR MEDICINE AT [facility] AT LEAST 24 HOURS IN ADVANCE IF YOU ARE UNABLE TO KEEP YOUR APPOINTMENT. [phone]  How to prepare for your Myoview test:   Do not eat or drink after midnight  No caffeine for 24 hours prior to test  No smoking 24 hours prior to test.  Your  medication may be taken with water.  If your doctor stopped a medication because of this test, do not take that medication.  Ladies, please do not wear dresses.  Skirts or pants are appropriate. Please wear a short sleeve shirt.  No perfume, cologne or lotion.   Follow-Up: 3 months  Any Other Special Instructions Will Be Listed Below (If Applicable).   Nuclear Medicine Exam A nuclear medicine exam is a safe and painless imaging test. It helps to detect and diagnose disease in the body as well as provide information about organ function and structure.  Nuclear scans are most often done of the:  Lungs.  Heart.  Thyroid gland.  Bones.  Abdomen. HOW A NUCLEAR MEDICINE EXAM WORKS A nuclear medicine exam works by using a radioactive tracer. The material is given either by an IV (intravenous) injection or it may be swallowed. After the tracer is in the body, it is absorbed by your body's organs. A large scanning machine that uses a special camera detects the radioactivity in your body. A computerized image is then formed regarding the area of concern. The small amounts of radioactive material used in a nuclear medicine exam are found to be medically safe. However, because radioactive material is used, this test is not done if you are pregnant or nursing.  BEFORE THE PROCEDURE  If available, bring previous imaging studies such as x-rays, etc. with you to the exam.  Arrive early for your exam. PROCEDURE  An IV may be started before the exam begins.  Depending on the type of  examination, will lie on a table or sit in a chair during the exam.  The nuclear medicine exam will take about 30 to 60 minutes to complete. AFTER THE PROCEDURE  After your scan is completed, the image(s) will be evaluated by a specialist. It is important that you follow up with your caregiver to find out your test results.  You may return to your regular activity as instructed by your caregiver. SEEK IMMEDIATE  MEDICAL CARE IF: You have shortness of breath or difficulty breathing. MAKE SURE YOU:   Understand these instructions.  Will watch your condition.  Will get help right away if you are not doing well or get worse. Document Released: 03/17/2004 Document Revised: 05/02/2011 Document Reviewed: 05/01/2008 [facility] Patient Information 2015 [place], [place]. This information is not intended to replace advice given to you by your health care provider. Make sure you discuss any questions you have with your health care provider.

## 2014-07-25 NOTE — Progress Notes (Signed)
Cardiology Office Note:  Date of Encounter: 07/25/2014  ID: Hannah Kirby, DOB 1945-12-20, MRN [id number]  PCP:  Margarita Rana, MD Primary Cardiologist:  Dr. [name], MD  Chief Complaint  Patient presents with  . other    Follow up from [facility] from chest pain. Pt. c/o another spell of mid sternum chest pain mostly after eating. Meds reviewed by the patient verbally.     HPI:  69 year old female with history of CAD s/p BMS x 2 to RCA in 02/2006, ongoing tobacco abuse, DM2, HLD, obesity, HTN, granulomatous disease who recently presented to [facility] ED on 6/2 with chest pain and presents to clinic today for hospital follow up.   She has known CAD s/p BMS x 2 to RCA in 02/2006 in the setting of a NSTEMI with a peak troponin of 0.2. Cardiac cath showed 99% stenosis of the RCA s/p stenting as above. Remaining coronary anatomy showed no significant disease. EF was normal. She was placed on aspirin and Plavix at that time and has continued to take these medications daily to date. She has not had any ischemic evaluations since the above cardiac catheterization.   She has continued to smoke tobacco daily and has previously declined assistance with cessation, including Chantix. She has issues with chronic back pain but does not want surgery at this time. Prefers to treat medically at this time.   Last A1C 6.8% from 04/2014. LDL 59 from 3/20216.   She presented to [facility] ED on 07/24/2014 with development of substernal chest pain that occurred while she was laying down, after she had eaten a meal. Symptoms felt like indigestion so she took an antacid with some relief. No associated SOB, diaphoresis, nausea, vomiting, palpitations, presyncope, or syncope. Symptoms felt different than the above episode from 2008. She received aspirin x 4 and remained pain free. EKG in the ED showed NSR, 75 bpm, inferior Q waves, nonspecific st/t changes. Troponin negative x 2. SCr 1.72, K+ 4.0, CBC unremarkable. CXR without evidence  of acute disease. A nodular density was noted by the radiologist in the left hilar region, new from comparison plain film of 2013. It was noted this may represent partially calcified lymph nodes of the hilum, however, noncontrast CT of the chest was recommended to evaluate for potential pulmonary nodule. She was advised to follow up with her PCP for the CXR findings.<<< She was given IV fluids for her elevated SCr. She was advised to follow up with cardiology regarding her chest pain.   She comes in stating she has been experiencing the above "indigestion" episodes over the past several weeks. All after eating a meal. She has never experienced an episode after exertion. The episodes have typically been lasting several minutes and self resolving or resolving with antiacid. However, the episode she had on 6/2 persisted for approximately 30 minutes without relief which caused her to go to the ED for evaluation. Upon her arrival to the ED her symptoms were resolving. She is actually more active now than she has been is years and has been tolerating this well. She and her husband recently bough a condo in [place] and they have been down there working on the place. She has been climbing the stairs and walking multiple blocks without issues. She has had multiple female family members that have had issues with their gallbladders around the same age and have had to have cholecystectomies. She last had the above symptoms this morning, again after eating. Symptoms resolved  after taking antacids. She is currently symptom free. Her weight is actually down 60 pounds since starting Victoza. She does continue to smoke anywhere from 1 to 1.5 packs per day. She is not interested in quitting at this time.      Past Medical History  Diagnosis Date  . GERD (gastroesophageal reflux disease)   . Hyperlipidemia   . Hypertension   . Hernia   . FHx: allergies   . Diabetes mellitus   . Arthritis   . Kidney stone on  left side 2013  . Chronic bronchitis     secondary to cigarette smoking  . Goiter   . Microalbuminuria   . Spinal stenosis   . CAD (coronary artery disease)     a. cardiac cath 02/2006: BMS x 2 to RCA, cath o/w without significant coronary disease  . Obesity   . Granulomatous disease   :  Past Surgical History  Procedure Laterality Date  . Hysterectomy (other)    . Breast biopsy    . Coronary angioplasty with stent placement  2008  :  Social History:  The patient  reports that she has been smoking Cigarettes.  She has a 40 pack-year smoking history. She does not have any smokeless tobacco history on file. She reports that she drinks alcohol. She reports that she does not use illicit drugs.   Family History  Problem Relation Age of Onset  . Heart failure Mother   . Stroke Mother   . Lung cancer Father      Allergies:  Allergies  Allergen Reactions  . Sulfonamide Derivatives Other (See Comments)    Last taken as a child; made her mouth break out  . Codeine Nausea Only  . Prednisone Palpitations and Other (See Comments)    Made her legs "feel weird."     Home Medications:  Current Outpatient Prescriptions  Medication Sig Dispense Refill  . albuterol (PROVENTIL HFA;VENTOLIN HFA) 108 (90 BASE) MCG/ACT inhaler Inhale 1 puff into the lungs 2 (two) times daily as needed for wheezing or shortness of breath.    Marland Kitchen aspirin (ASPIR-81) 81 MG EC tablet Take 81 mg by mouth daily.      . calcium carbonate (OS-CAL) 600 MG TABS Take 600 mg by mouth every evening.     . Calcium-Vitamin D 600-200 MG-UNIT per tablet Take by mouth.    . chlorhexidine (PERIDEX) 0.12 % solution Use as directed 15 mLs in the mouth or throat 2 (two) times daily. 120 mL 6  . clopidogrel (PLAVIX) 75 MG tablet Take 1 tablet (75 mg total) by mouth daily. 90 tablet 3  . HYDROcodone-acetaminophen (NORCO/VICODIN) 5-325 MG per tablet Take 1 tablet by mouth every 6 (six) hours as needed for pain.    . Liraglutide  (VICTOZA) 18 MG/3ML SOLN injection Inject 1.2 mg into the skin daily.     Marland Kitchen lisinopril (PRINIVIL,ZESTRIL) 40 MG tablet Take 1 tablet (40 mg total) by mouth daily. 90 tablet 3  . metFORMIN (GLUMETZA) 1000 MG (MOD) 24 hr tablet Take 1,000 mg by mouth 2 (two) times daily.      . metoprolol tartrate (LOPRESSOR) 25 MG tablet Take 1 tablet (25 mg total) by mouth 2 (two) times daily. 180 tablet 3  . Multiple Vitamins-Minerals (DAILY MULTI) TABS Take 1 tablet by mouth daily.     . nicotine (NICODERM CQ - DOSED IN MG/24 HOURS) 21 mg/24hr patch Place 1 patch (21 mg total) onto the skin daily. 28 patch 12  . nitroGLYCERIN (  NITROSTAT) 0.4 MG SL tablet Place 1 tablet (0.4 mg total) under the tongue every 5 (five) minutes as needed. 25 tablet 6  . Omega-3 Fatty Acids (FISH OIL) 1000 MG CAPS Take 2 capsules by mouth 2 (two) times daily. Take 4    . omeprazole (PRILOSEC) 40 MG capsule Take 1 capsule (40 mg total) by mouth 2 (two) times daily. Take extra cap if more heartburn than ususal 180 capsule 3  . ondansetron (ZOFRAN) 4 MG tablet Take 4 mg by mouth every 8 (eight) hours as needed for nausea or vomiting.    . rosuvastatin (CRESTOR) 40 MG tablet Take 1 tablet (40 mg total) by mouth daily. 90 tablet 3  . tiotropium (SPIRIVA) 18 MCG inhalation capsule Place 18 mcg into inhaler and inhale daily.    . ranitidine (ZANTAC) 150 MG tablet Take 1 tablet (150 mg total) by mouth 2 (two) times daily. 30 tablet 6   No current facility-administered medications for this visit.     Review of Systems:  Review of Systems  Constitutional: Positive for weight loss. Negative for fever, chills, malaise/fatigue and diaphoresis.  HENT: Positive for sore throat.   Respiratory: Negative for cough.   Gastrointestinal: Positive for heartburn and abdominal pain. Negative for nausea, vomiting, diarrhea, constipation, blood in stool and melena.       Positive for eructation   Genitourinary: Negative for hematuria.  Musculoskeletal:  Positive for back pain and neck pain. Negative for myalgias, joint pain and falls.  Skin: Negative for rash.  Neurological: Negative for dizziness, tingling, sensory change, speech change, loss of consciousness, weakness and headaches.  Psychiatric/Behavioral: The patient is not nervous/anxious.   All other systems reviewed and are negative.    Physical Exam:  Blood pressure 130/82, pulse 71, height 5\' 6"  (1.676 m), weight 158 lb 12 oz (72.009 kg). Body mass index is 25.64 kg/(m^2). General: Pleasant, NAD, smells strongly of tobacco.  Psych: Normal affect. Neuro: Alert and oriented X 3. Moves all extremities spontaneously. HEENT: Normocephalic, atraumatic, EOM intact bilaterally  Neck: Supple without bruits or JVD. Lungs:  Resp regular and unlabored, CTA. Heart: RRR no s3, s4, or murmurs. Abdomen: Obese, soft, mild TTP along epigastrum, no rebound, negative Murphy's, negative McBurney's, non-distended, BS + x 4.  Extremities: No clubbing, cyanosis or edema.    Accessory Clinical Findings:  EKG - NSR, 71 bpm, inferior Q waves, nonspecific st/t wave abnormality   Other studies Reviewed: Additional studies/ records that were reviewed today include: prior notes.   Recent Labs: 07/24/2014: BUN 38*; Creatinine 1.72*; Hemoglobin 13.5; Platelets 214; Potassium 4.0; Sodium 140    Lipid Panel No results found for: CHOL, TRIG, HDL, CHOLHDL, VLDL, LDLCALC, LDLDIRECT   Weights: Wt Readings from Last 3 Encounters:  07/25/14 158 lb 12 oz (72.009 kg)  07/24/14 159 lb (72.122 kg)  05/14/14 159 lb 4 oz (72.235 kg)     Assessment & Plan:  1. Chest pain with moderate risk for cardiac etiology:  -Schedule for Lexiscan nuclear stress test given her history of CAD as described above, history of DM, ongoing tobacco abuse, HLD, and obesity -Obtain stress test results that were done through [facility] in approximately 2010/2011 -Symptoms sound more GI in etiology, she has an appointment with her GI  MD on 08/22/2014, advised her to keep this -Multiple females in her family have had to have cholecystectomies at approximately the same age -Symptoms are only associated with food, she is otherwise asymptomatic   -Continue Prilosec  -Could add  Zantac 150 mg daily   -SL NTG prn   2. CAD s/p BMS x 2 to RCA in 02/2006:  -Continue DAPT with aspirin 81 mg and Plavix 75 mg daily -She has not missed any doses of her aspirin or Plavix   -Continue Lopressor 25 mg bid   3. Pulmonary nodule:  -Follow up with PCP   4. HTN:  -Well controlled  -Norvasc 5 mg, lisinopril 40 mg, and Lopressor 25 mg bid  5. HLD:  -LDL at goal (59) per last check 04/2014  -Continue Crestor 40 mg daily   6. DM2:  -A1C at 6.8% at 04/2014 check  -Continue current medications per PCP   7. Ongoing tobacco abuse:  -Cessation is advised  -Not currently interested in assistance    Dispo: -Follow up with Dr. [name], MD in 3 months, sooner if dictated by nuclear stress test    Christell Faith, PA-C [place], [place] [address] [phone] [facility] 07/25/2014, 5:21 PM

## 2014-07-28 ENCOUNTER — Ambulatory Visit (INDEPENDENT_AMBULATORY_CARE_PROVIDER_SITE_OTHER): Payer: Medicare Other | Admitting: Family Medicine

## 2014-07-28 ENCOUNTER — Other Ambulatory Visit: Payer: Self-pay

## 2014-07-28 ENCOUNTER — Encounter: Payer: Self-pay | Admitting: Family Medicine

## 2014-07-28 VITALS — BP 126/80 | HR 80 | Temp 98.3°F | Resp 16 | Ht 65.0 in | Wt 158.0 lb

## 2014-07-28 DIAGNOSIS — N189 Chronic kidney disease, unspecified: Secondary | ICD-10-CM | POA: Diagnosis not present

## 2014-07-28 DIAGNOSIS — R748 Abnormal levels of other serum enzymes: Secondary | ICD-10-CM | POA: Insufficient documentation

## 2014-07-28 DIAGNOSIS — I2510 Atherosclerotic heart disease of native coronary artery without angina pectoris: Secondary | ICD-10-CM | POA: Diagnosis not present

## 2014-07-28 DIAGNOSIS — J449 Chronic obstructive pulmonary disease, unspecified: Secondary | ICD-10-CM | POA: Insufficient documentation

## 2014-07-28 DIAGNOSIS — R42 Dizziness and giddiness: Secondary | ICD-10-CM | POA: Insufficient documentation

## 2014-07-28 DIAGNOSIS — N183 Chronic kidney disease, stage 3 unspecified: Secondary | ICD-10-CM

## 2014-07-28 DIAGNOSIS — R938 Abnormal findings on diagnostic imaging of other specified body structures: Secondary | ICD-10-CM

## 2014-07-28 DIAGNOSIS — K219 Gastro-esophageal reflux disease without esophagitis: Secondary | ICD-10-CM | POA: Insufficient documentation

## 2014-07-28 DIAGNOSIS — R9389 Abnormal findings on diagnostic imaging of other specified body structures: Secondary | ICD-10-CM

## 2014-07-28 DIAGNOSIS — E1122 Type 2 diabetes mellitus with diabetic chronic kidney disease: Secondary | ICD-10-CM | POA: Diagnosis not present

## 2014-07-28 DIAGNOSIS — J309 Allergic rhinitis, unspecified: Secondary | ICD-10-CM | POA: Insufficient documentation

## 2014-07-28 DIAGNOSIS — E119 Type 2 diabetes mellitus without complications: Secondary | ICD-10-CM | POA: Insufficient documentation

## 2014-07-28 NOTE — Progress Notes (Signed)
Subjective:    Patient ID: Hannah Kirby, female    DOB: 04/14/1945, 69 y.o.   MRN: [id number]  HPI Comments: Pt is here for a hospital FU. Pt was seen at [facility] ED on 07/24/2014 for chest pain and dizziness. Pt has a h/o cardiac stents. Pt was diagnosed with renal insufficiency and Lung Nodule. Pt has F/U with cardiology (Dr. Dayton Martes, PA), and has a nuclear stress test scheduled for tomorrow. Pt was told to FU with her PCP to discuss the lung nodule and renal insufficiency.   . Past Medical History  Diagnosis Date  . GERD (gastroesophageal reflux disease)   . Hyperlipidemia   . Hypertension   . Hernia   . FHx: allergies   . Diabetes mellitus   . Arthritis   . Kidney stone on left side 2013  . Chronic bronchitis     secondary to cigarette smoking  . Goiter   . Microalbuminuria   . Spinal stenosis   . CAD (coronary artery disease)     a. cardiac cath 02/2006: BMS x 2 to RCA, cath o/w without significant coronary disease  . Obesity   . Granulomatous disease    Past Surgical History  Procedure Laterality Date  . Hysterectomy (other)    . Breast biopsy    . Coronary angioplasty with stent placement  2008  . Abdominal hysterectomy      reports that she has been smoking Cigarettes.  She has a 40 pack-year smoking history. She has never used smokeless tobacco. She reports that she drinks alcohol. She reports that she does not use illicit drugs. family history includes Breast cancer in her maternal aunt; Congestive Heart Failure in her mother; Diabetes in her mother; Heart failure in her mother; Lung cancer in her father; Stroke in her mother. Allergies  Allergen Reactions  . Sulfonamide Derivatives Other (See Comments)    Last taken as a child; made her mouth break out  . Codeine Nausea Only  . Prednisone Palpitations and Other (See Comments)    Made her legs "feel weird."      Review of Systems  Constitutional: Negative for fever, chills, diaphoresis, activity  change, appetite change, fatigue and unexpected weight change.  Respiratory: Positive for cough and wheezing. Negative for chest tightness and shortness of breath.   Cardiovascular: Negative for chest pain, palpitations and leg swelling.       Objective:   Physical Exam  Constitutional: She is oriented to person, place, and time. She appears well-developed and well-nourished.  Cardiovascular: Normal rate and regular rhythm.   Pulmonary/Chest: Effort normal and breath sounds normal.  Neurological: She is alert and oriented to person, place, and time.  Psychiatric: She has a normal mood and affect. Her behavior is normal. Thought content normal.     BP 126/80 mmHg  Pulse 80  Temp(Src) 98.3 F (36.8 C) (Oral)  Resp 16  Ht 5' 5"  (1.651 m)  Wt 158 lb (71.668 kg)  BMI 26.29 kg/m2      Assessment & Plan:  1. Chronic kidney disease, stage III (moderate) New problem; incidental lab finding while at [facility] ED. Possibly secondary to dehydration, will reorder labs today. FU pending results. Refer to nephrology. Advised pt to stay well hydrated.   - Comp Met (CMET) - Ambulatory referral to Nephrology  2. Abnormal chest x-ray New problem. See report from [facility] imaging 07/24/2014. Order CT Chest WO contrast as below. FU pending results.  - CT Chest Wo Contrast; Future  3. Type 2 diabetes mellitus with diabetic chronic kidney disease Will recheck labs. Pt will call endocrinology with results to see if Dr. Eddie Dibbles recommends continuing Metformin.    Patient seen and examined by Jerrell Belfast, MD, and note scribed by Renaldo Fiddler, CMA.  I have reviewed the document for accuracy and completeness and I agree with above. Jerrell Belfast, MD

## 2014-07-29 ENCOUNTER — Ambulatory Visit
Admission: RE | Admit: 2014-07-29 | Discharge: 2014-07-29 | Disposition: A | Discharge status: Home / Self Care | Payer: Medicare Other | Source: Ambulatory Visit | Attending: Physician Assistant | Admitting: Physician Assistant

## 2014-07-29 DIAGNOSIS — R079 Chest pain, unspecified: Secondary | ICD-10-CM | POA: Insufficient documentation

## 2014-07-29 LAB — COMPREHENSIVE METABOLIC PANEL
A/G RATIO: 2 (ref 1.1–2.5)
ALBUMIN: 4.5 g/dL (ref 3.6–4.8)
ALK PHOS: 112 IU/L (ref 39–117)
ALT: 22 IU/L (ref 0–32)
AST: 28 IU/L (ref 0–40)
BUN / CREAT RATIO: 18 (ref 11–26)
BUN: 27 mg/dL (ref 8–27)
Bilirubin Total: 0.3 mg/dL (ref 0.0–1.2)
CO2: 24 mmol/L (ref 18–29)
Calcium: 10.2 mg/dL (ref 8.7–10.3)
Chloride: 97 mmol/L (ref 97–108)
Creatinine, Ser: 1.52 mg/dL — ABNORMAL HIGH (ref 0.57–1.00)
GFR calc non Af Amer: 35 mL/min/{1.73_m2} — ABNORMAL LOW (ref >59)
GFR, EST AFRICAN AMERICAN: 40 mL/min/{1.73_m2} — AB (ref >59)
Globulin, Total: 2.3 g/dL (ref 1.5–4.5)
Glucose: 191 mg/dL — ABNORMAL HIGH (ref 65–99)
Potassium: 4.4 mmol/L (ref 3.5–5.2)
Sodium: 137 mmol/L (ref 134–144)
Total Protein: 6.8 g/dL (ref 6.0–8.5)

## 2014-07-29 LAB — NM MYOCAR MULTI W/SPECT W/WALL MOTION / EF
CHL CUP STRESS STAGE 1 GRADE: 0 %
CHL CUP STRESS STAGE 2 HR: 66 {beats}/min
CHL CUP STRESS STAGE 4 GRADE: 0 %
CHL CUP STRESS STAGE 4 HR: 87 {beats}/min
CHL CUP STRESS STAGE 5 DBP: 80 mmHg
CHL CUP STRESS STAGE 6 GRADE: 0 %
CHL CUP STRESS STAGE 6 HR: 91 {beats}/min
CHL CUP STRESS STAGE 6 SPEED: 0 mph
CSEPPHR: 87 {beats}/min
CSEPPMHR: 57 %
Estimated workload: 1 METS
Exercise duration (min): 0 min
Exercise duration (sec): 0 s
LV sys vol: 27 mL
LVDIAVOL: 68 mL
MPHR: 152 {beats}/min
NUC STRESS TID: 1.15
Percent HR: 63 %
Rest HR: 66 {beats}/min
SDS: 0
SRS: 3
SSS: 0
Stage 1 HR: 67 {beats}/min
Stage 1 Speed: 0 mph
Stage 2 DBP: 90 mmHg
Stage 2 Grade: 0 %
Stage 2 SBP: 147 mmHg
Stage 2 Speed: 0 mph
Stage 3 Grade: 0 %
Stage 3 HR: 66 {beats}/min
Stage 3 Speed: 0 mph
Stage 4 Speed: 0 mph
Stage 5 Grade: 0 %
Stage 5 HR: 94 {beats}/min
Stage 5 SBP: 142 mmHg
Stage 5 Speed: 0 mph
Stage 6 DBP: 80 mmHg
Stage 6 SBP: 142 mmHg

## 2014-07-29 MED ORDER — TECHNETIUM TC 99M SESTAMIBI - CARDIOLITE: 13.8820 | Freq: Once | INTRAVENOUS | Status: AC | PRN

## 2014-07-29 MED ORDER — TECHNETIUM TC 99M SESTAMIBI - CARDIOLITE
33.2790 | Freq: Once | INTRAVENOUS | Status: AC | PRN
  Administered 2014-07-29: 09:00:00 33.279 via INTRAVENOUS

## 2014-07-30 ENCOUNTER — Encounter: Payer: Self-pay | Admitting: Family Medicine

## 2014-07-31 ENCOUNTER — Encounter: Payer: Self-pay | Admitting: Physician Assistant

## 2014-07-31 NOTE — Telephone Encounter (Addendum)
S/w patient who states she has not had bloody stool today.  Reviewed Emeline Gins recommendations. Patient verbalized understanding and [place] if [place] sees more bloody stool.    Yes, a polyp may bleed and result in blood in the stool. She needs to see her PCP or gastroenterologist as soon as possible for exam and guaiac of stool. I suspect that with BRBPR, they will want to perform a colonoscopy. If she has continued to have BRBPR, than not only should she hold her plavix (last stent 2008), but she should also present to the ED.        ----- Message -----     From: Roland Earl Tench     Sent: 07/31/2014 10:45 AM      To: Rogelia Mire, NP    Subject: Non-Urgent Medical Question

## 2014-08-01 ENCOUNTER — Ambulatory Visit

## 2014-08-20 ENCOUNTER — Ambulatory Visit
Admission: RE | Admit: 2014-08-20 | Discharge: 2014-08-20 | Disposition: A | Discharge status: Home / Self Care | Payer: Medicare Other | Source: Ambulatory Visit | Attending: Family Medicine | Admitting: Family Medicine

## 2014-08-20 DIAGNOSIS — I2510 Atherosclerotic heart disease of native coronary artery without angina pectoris: Secondary | ICD-10-CM | POA: Diagnosis not present

## 2014-08-20 DIAGNOSIS — R938 Abnormal findings on diagnostic imaging of other specified body structures: Secondary | ICD-10-CM | POA: Diagnosis present

## 2014-08-20 DIAGNOSIS — R911 Solitary pulmonary nodule: Secondary | ICD-10-CM | POA: Diagnosis not present

## 2014-08-20 DIAGNOSIS — R0989 Other specified symptoms and signs involving the circulatory and respiratory systems: Secondary | ICD-10-CM | POA: Diagnosis not present

## 2014-08-20 DIAGNOSIS — R9389 Abnormal findings on diagnostic imaging of other specified body structures: Secondary | ICD-10-CM

## 2014-08-20 DIAGNOSIS — R05 Cough: Secondary | ICD-10-CM | POA: Diagnosis not present

## 2014-08-21 DIAGNOSIS — E1122 Type 2 diabetes mellitus with diabetic chronic kidney disease: Secondary | ICD-10-CM | POA: Diagnosis not present

## 2014-08-21 DIAGNOSIS — N183 Chronic kidney disease, stage 3 (moderate): Secondary | ICD-10-CM | POA: Diagnosis not present

## 2014-08-21 DIAGNOSIS — I10 Essential (primary) hypertension: Secondary | ICD-10-CM | POA: Diagnosis not present

## 2014-08-21 DIAGNOSIS — R809 Proteinuria, unspecified: Secondary | ICD-10-CM | POA: Diagnosis not present

## 2014-08-22 DIAGNOSIS — K625 Hemorrhage of anus and rectum: Secondary | ICD-10-CM | POA: Diagnosis not present

## 2014-08-22 DIAGNOSIS — R0789 Other chest pain: Secondary | ICD-10-CM | POA: Diagnosis not present

## 2014-08-22 DIAGNOSIS — R1013 Epigastric pain: Secondary | ICD-10-CM | POA: Diagnosis not present

## 2014-08-22 DIAGNOSIS — Z86010 Personal history of colonic polyps: Secondary | ICD-10-CM | POA: Diagnosis not present

## 2014-08-26 ENCOUNTER — Other Ambulatory Visit: Payer: Self-pay | Admitting: Family Medicine

## 2014-08-26 DIAGNOSIS — J449 Chronic obstructive pulmonary disease, unspecified: Secondary | ICD-10-CM

## 2014-09-01 DIAGNOSIS — N183 Chronic kidney disease, stage 3 (moderate): Secondary | ICD-10-CM | POA: Diagnosis not present

## 2014-09-01 DIAGNOSIS — I10 Essential (primary) hypertension: Secondary | ICD-10-CM | POA: Diagnosis not present

## 2014-09-01 DIAGNOSIS — E1122 Type 2 diabetes mellitus with diabetic chronic kidney disease: Secondary | ICD-10-CM | POA: Diagnosis not present

## 2014-09-01 DIAGNOSIS — R809 Proteinuria, unspecified: Secondary | ICD-10-CM | POA: Diagnosis not present

## 2014-09-23 ENCOUNTER — Ambulatory Visit: Payer: Medicare Other | Admitting: Anesthesiology

## 2014-09-23 ENCOUNTER — Encounter: Payer: Self-pay | Admitting: *Deleted

## 2014-09-23 ENCOUNTER — Encounter
Admission: RE | Disposition: A | Discharge status: Home / Self Care | Payer: Self-pay | Source: Ambulatory Visit | Attending: Gastroenterology

## 2014-09-23 ENCOUNTER — Ambulatory Visit
Admission: RE | Admit: 2014-09-23 | Discharge: 2014-09-23 | Disposition: A | Discharge status: Home / Self Care | Payer: Medicare Other | Source: Ambulatory Visit | Attending: Gastroenterology | Admitting: Gastroenterology

## 2014-09-23 DIAGNOSIS — F17210 Nicotine dependence, cigarettes, uncomplicated: Secondary | ICD-10-CM | POA: Insufficient documentation

## 2014-09-23 DIAGNOSIS — I2510 Atherosclerotic heart disease of native coronary artery without angina pectoris: Secondary | ICD-10-CM | POA: Diagnosis not present

## 2014-09-23 DIAGNOSIS — K625 Hemorrhage of anus and rectum: Secondary | ICD-10-CM | POA: Diagnosis not present

## 2014-09-23 DIAGNOSIS — Z7982 Long term (current) use of aspirin: Secondary | ICD-10-CM | POA: Insufficient documentation

## 2014-09-23 DIAGNOSIS — D120 Benign neoplasm of cecum: Secondary | ICD-10-CM | POA: Diagnosis not present

## 2014-09-23 DIAGNOSIS — Z79899 Other long term (current) drug therapy: Secondary | ICD-10-CM | POA: Insufficient documentation

## 2014-09-23 DIAGNOSIS — R1013 Epigastric pain: Secondary | ICD-10-CM | POA: Diagnosis not present

## 2014-09-23 DIAGNOSIS — K219 Gastro-esophageal reflux disease without esophagitis: Secondary | ICD-10-CM | POA: Diagnosis not present

## 2014-09-23 DIAGNOSIS — K5790 Diverticulosis of intestine, part unspecified, without perforation or abscess without bleeding: Secondary | ICD-10-CM | POA: Diagnosis not present

## 2014-09-23 DIAGNOSIS — K5730 Diverticulosis of large intestine without perforation or abscess without bleeding: Secondary | ICD-10-CM | POA: Insufficient documentation

## 2014-09-23 DIAGNOSIS — J449 Chronic obstructive pulmonary disease, unspecified: Secondary | ICD-10-CM | POA: Insufficient documentation

## 2014-09-23 DIAGNOSIS — D122 Benign neoplasm of ascending colon: Secondary | ICD-10-CM | POA: Diagnosis not present

## 2014-09-23 DIAGNOSIS — Z86010 Personal history of colonic polyps: Secondary | ICD-10-CM | POA: Insufficient documentation

## 2014-09-23 DIAGNOSIS — R0789 Other chest pain: Secondary | ICD-10-CM | POA: Diagnosis not present

## 2014-09-23 DIAGNOSIS — E119 Type 2 diabetes mellitus without complications: Secondary | ICD-10-CM | POA: Diagnosis not present

## 2014-09-23 DIAGNOSIS — I10 Essential (primary) hypertension: Secondary | ICD-10-CM | POA: Diagnosis not present

## 2014-09-23 DIAGNOSIS — K635 Polyp of colon: Secondary | ICD-10-CM | POA: Diagnosis not present

## 2014-09-23 DIAGNOSIS — M1990 Unspecified osteoarthritis, unspecified site: Secondary | ICD-10-CM | POA: Insufficient documentation

## 2014-09-23 DIAGNOSIS — E785 Hyperlipidemia, unspecified: Secondary | ICD-10-CM | POA: Insufficient documentation

## 2014-09-23 HISTORY — PX: COLONOSCOPY WITH PROPOFOL: SHX5780

## 2014-09-23 LAB — GLUCOSE, CAPILLARY: Glucose-Capillary: 139 mg/dL — ABNORMAL HIGH (ref 65–99)

## 2014-09-23 SURGERY — COLONOSCOPY WITH PROPOFOL
Anesthesia: General

## 2014-09-23 MED ORDER — PROPOFOL INFUSION 10 MG/ML OPTIME
INTRAVENOUS | Status: DC | PRN
  Administered 2014-09-23: 140 ug/kg/min via INTRAVENOUS

## 2014-09-23 MED ORDER — LIDOCAINE HCL (CARDIAC) 20 MG/ML IV SOLN
INTRAVENOUS | Status: DC | PRN
  Administered 2014-09-23: 30 mg via INTRAVENOUS

## 2014-09-23 MED ORDER — FENTANYL CITRATE (PF) 100 MCG/2ML IJ SOLN
INTRAMUSCULAR | Status: DC | PRN
  Administered 2014-09-23: 50 ug via INTRAVENOUS

## 2014-09-23 MED ORDER — PROPOFOL 10 MG/ML IV BOLUS
INTRAVENOUS | Status: DC | PRN
  Administered 2014-09-23: 50 mg via INTRAVENOUS

## 2014-09-23 MED ORDER — SODIUM CHLORIDE 0.9 % IV SOLN
INTRAVENOUS | Status: DC
  Administered 2014-09-23 (×2): via INTRAVENOUS

## 2014-09-23 MED ORDER — MIDAZOLAM HCL 5 MG/5ML IJ SOLN
INTRAMUSCULAR | Status: DC | PRN
  Administered 2014-09-23: 1 mg via INTRAVENOUS

## 2014-09-23 MED ORDER — SODIUM CHLORIDE 0.9 % IV SOLN: INTRAVENOUS | Status: DC

## 2014-09-23 NOTE — H&P (Signed)
Outpatient short stay form Pre-procedure 09/23/2014 9:01 AM Hannah Sails MD  Primary Physician: Dr. Margarita Rana  Reason for visit: EGD and colonoscopy   History of present illness:   Atypical chest pain, dyspepsia, rectal bleeding, personal history of adenomatous colon polyps. Patient tolerated her prep well. He does take aspirin and Plavix but discontinued this about  7 days ago and unfortunately she had 2 dental extractions yesterday. She had been taking a number for medications right before going to bed. We discussed this today I would like for try to take these at least an hour and a half before she lies flat.     Current facility-administered medications:  .  0.9 %  sodium chloride infusion, , Intravenous, Continuous, Hannah Sails, MD .  0.9 %  sodium chloride infusion, , Intravenous, Continuous, Hannah Sails, MD, Last Rate: 20 mL/hr at 09/23/14 3329  Prescriptions prior to admission  Medication Sig Dispense Refill Last Dose  . aspirin (ASPIR-81) 81 MG EC tablet Take 81 mg by mouth daily.     09/21/2014 at Unknown time  . calcium carbonate (OS-CAL) 600 MG TABS Take 600 mg by mouth every evening.    Past Week at Unknown time  . Calcium-Vitamin D 600-200 MG-UNIT per tablet Take by mouth.   Past Week at Unknown time  . chlorhexidine (PERIDEX) 0.12 % solution Use as directed 15 mLs in the mouth or throat 2 (two) times daily. 120 mL 6 Past Week at Unknown time  . clopidogrel (PLAVIX) 75 MG tablet Take 1 tablet (75 mg total) by mouth daily. 90 tablet 3 Past Week at Unknown time  . Coenzyme Q10 (CO Q 10) 100 MG CAPS Take by mouth.   Past Week at Unknown time  . HYDROcodone-acetaminophen (NORCO/VICODIN) 5-325 MG per tablet Take 1 tablet by mouth every 6 (six) hours as needed for pain.   09/23/2014 at 0600  . Liraglutide (VICTOZA) 18 MG/3ML SOLN injection Inject 1.2 mg into the skin daily.    09/21/2014 at Unknown time  . lisinopril (PRINIVIL,ZESTRIL) 40 MG tablet Take 1 tablet (40  mg total) by mouth daily. 90 tablet 3 09/23/2014 at 0600  . meclizine (ANTIVERT) 25 MG tablet Take by mouth.   Past Week at Unknown time  . metFORMIN (GLUCOPHAGE) 1000 MG tablet Take by mouth.   Past Month at Unknown time  . metoprolol tartrate (LOPRESSOR) 25 MG tablet Take 1 tablet (25 mg total) by mouth 2 (two) times daily. 180 tablet 3 09/23/2014 at 0600  . Multiple Vitamins-Minerals (DAILY MULTI) TABS Take 1 tablet by mouth daily.    Past Week at Unknown time  . nitroGLYCERIN (NITROSTAT) 0.4 MG SL tablet Place 1 tablet (0.4 mg total) under the tongue every 5 (five) minutes as needed. 25 tablet 6 Past Month at Unknown time  . Omega-3 Fatty Acids (FISH OIL) 1000 MG CAPS Take 2 capsules by mouth 2 (two) times daily. Take 4   Past Week at Unknown time  . Omeprazole 20 MG TBEC Take by mouth.   09/22/2014 at Unknown time  . ondansetron (ZOFRAN) 4 MG tablet Take 4 mg by mouth every 8 (eight) hours as needed for nausea or vomiting.   Past Week at Unknown time  . ranitidine (ZANTAC) 150 MG tablet Take 1 tablet (150 mg total) by mouth 2 (two) times daily. 30 tablet 6 09/22/2014 at Unknown time  . rosuvastatin (CRESTOR) 40 MG tablet Take 1 tablet (40 mg total) by mouth daily. 90 tablet  3 Past Week at Unknown time  . tiotropium (SPIRIVA) 18 MCG inhalation capsule Place 18 mcg into inhaler and inhale daily.   09/22/2014 at Unknown time  . VENTOLIN HFA 108 (90 BASE) MCG/ACT inhaler 2 PUFFS VIA INHALATION EVERY 4 HOURS AS NEEDED 18 g 0 09/23/2014 at 0600  . nicotine (NICODERM CQ - DOSED IN MG/24 HOURS) 21 mg/24hr patch Place 1 patch (21 mg total) onto the skin daily. (Patient not taking: Reported on 07/28/2014) 28 patch 12 Completed Course at Unknown time     Allergies  Allergen Reactions  . Sulfonamide Derivatives Other (See Comments)    Last taken as a child; made her mouth break out  . Codeine Nausea Only  . Prednisone Palpitations and Other (See Comments)    Made her legs "feel weird."     Past Medical History   Diagnosis Date  . GERD (gastroesophageal reflux disease)   . Hyperlipidemia   . Hypertension   . Hernia   . FHx: allergies   . Diabetes mellitus   . Arthritis   . Kidney stone on left side 2013  . Chronic bronchitis     secondary to cigarette smoking  . Goiter   . Microalbuminuria   . Spinal stenosis   . CAD (coronary artery disease)     a. cath 02/2006: BMS x 2 to RCA, cath o/w without significant coronary disease; b. nuclear stress test 07/2014: no signs of ischemia, no ekg changes concerning for ischemia, low risk study/normal study  . Obesity   . Granulomatous disease     Review of systems:      Physical Exam    Heart and lungs: Regular rate and rhythm without rub or gallop lungs are bilaterally clear    HEENT: Norm cephalic atraumatic eyes are anicteric    Other:     Pertinant exam for procedure: Soft nontender nondistended bowel sounds positive normoactive. Mild protuberance.    Planned proceedures: Colonoscopy and indicated procedures. We'll hold on doing the EGD until per dental sockets have healed further. I have discussed the risks benefits and complications of procedures to include not limited to bleeding, infection, perforation and the risk of sedation and the patient wishes to proceed.    Hannah Sails, MD Gastroenterology 09/23/2014  9:01 AM

## 2014-09-23 NOTE — Anesthesia Preprocedure Evaluation (Addendum)
Anesthesia Evaluation  Patient identified by MRN, date of birth, ID band Patient awake    Reviewed: Allergy & Precautions, NPO status , Patient's Chart, lab work & pertinent test results  Airway Mallampati: IV       Dental  (+) Chipped   Pulmonary COPD COPD inhaler, Current Smoker,  + rhonchi   + decreased breath sounds      Cardiovascular hypertension, Pt. on home beta blockers + CAD Normal cardiovascular exam    Neuro/Psych    GI/Hepatic Neg liver ROS, GERD-  ,  Endo/Other  diabetes, Type 2, Oral Hypoglycemic Agents  Renal/GU      Musculoskeletal  (+) Arthritis -,   Abdominal Normal abdominal exam  (+)   Peds  Hematology   Anesthesia Other Findings   Reproductive/Obstetrics                         Anesthesia Physical Anesthesia Plan  ASA: III  Anesthesia Plan: General   Post-op Pain Management:    Induction: Intravenous  Airway Management Planned: Nasal Cannula  Additional Equipment:   Intra-op Plan:   Post-operative Plan:   Informed Consent: I have reviewed the patients History and Physical, chart, labs and discussed the procedure including the risks, benefits and alternatives for the proposed anesthesia with the patient or authorized representative who has indicated his/her understanding and acceptance.     Plan Discussed with: CRNA  Anesthesia Plan Comments:         Anesthesia Quick Evaluation

## 2014-09-23 NOTE — Transfer of Care (Signed)
Immediate Anesthesia Transfer of Care Note  Patient: Hannah Kirby  Procedure(s) Performed: Procedure(s): COLONOSCOPY WITH PROPOFOL (N/A)  Patient Location: PACU and Short Stay  Anesthesia Type:General  Level of Consciousness: sedated  Airway & Oxygen Therapy: Patient Spontanous Breathing and Patient connected to nasal cannula oxygen  Post-op Assessment: Report given to RN  Post vital signs: Reviewed and stable  Last Vitals:  Filed Vitals:   09/23/14 1028  BP: 110/79  Pulse: 65  Temp: 35.7 C  Resp: 16    Complications: No apparent anesthesia complications

## 2014-09-23 NOTE — Anesthesia Postprocedure Evaluation (Signed)
  Anesthesia Post-op Note  Patient: Hannah Kirby  Procedure(s) Performed: Procedure(s): COLONOSCOPY WITH PROPOFOL (N/A)  Anesthesia type:General  Patient location: PACU  Post pain: Pain level controlled  Post assessment: Post-op Vital signs reviewed, Patient's Cardiovascular Status Stable, Respiratory Function Stable, Patent Airway and No signs of Nausea or vomiting  Post vital signs: Reviewed and stable  Last Vitals:  Filed Vitals:   09/23/14 1030  BP: 143/72  Pulse: 64  Temp:   Resp: 16    Level of consciousness: awake, alert  and patient cooperative  Complications: No apparent anesthesia complications

## 2014-09-23 NOTE — Op Note (Signed)
[facility] Gastroenterology Patient Name: Hannah Kirby Procedure Date: 09/23/2014 9:05 AM MRN: [id number] Account #: [id number] Date of Birth: 1945/07/10 Admit Type: Outpatient Age: 69 Room: [facility] ENDO ROOM 3 Gender: Female Note Status: Finalized Procedure:         Colonoscopy Indications:       Personal history of colonic polyps Providers:         Lollie Sails, MD Referring MD:      Jerrell Belfast, MD (Referring MD) Medicines:         Monitored Anesthesia Care Complications:     No immediate complications. Procedure:         Pre-Anesthesia Assessment:                    - ASA Grade Assessment: IV - A patient with severe                     systemic disease that is a constant threat to life.                    After obtaining informed consent, the colonoscope was                     passed under direct vision. Throughout the procedure, the                     patient's blood pressure, pulse, and oxygen saturations                     were monitored continuously. The Colonoscope was                     introduced through the anus and advanced to the the cecum,                     identified by appendiceal orifice and ileocecal valve. The                     colonoscopy was unusually difficult due to multiple                     diverticula in the colon, poor bowel prep, significant                     looping and a tortuous colon. Successful completion of the                     procedure was aided by changing the patient to a supine                     position, changing the patient to a prone position and                     using manual pressure. The patient tolerated the procedure                     well. Findings:      Many small and large-mouthed diverticula were found in the sigmoid colon       and in the descending colon.      Multiple medium-mouthed diverticula were found in the transverse colon       and in the ascending colon.      A 2 mm  polyp was found in the cecum.  The polyp was sessile. The polyp       was removed with a cold biopsy forceps. Resection and retrieval were       complete.      Two sessile polyps were found in the proximal ascending colon. The       polyps were 6 to 12 mm in size. These polyps were removed with a cold       snare. Resection and retrieval were complete. To prevent bleeding after       the polypectomy, three hemostatic clips were successfully placed. There       was no bleeding during the maneuver.      The digital rectal exam was normal. Impression:        - Diverticulosis in the sigmoid colon and in the                     descending colon.                    - Diverticulosis in the transverse colon and in the                     ascending colon.                    - One 2 mm polyp in the cecum. Resected and retrieved.                    - Two 6 to 12 mm polyps in the proximal ascending colon.                     Resected and retrieved. Clips were placed. Recommendation:    - Await pathology results. Procedure Code(s): --- Professional ---                    [phone], Colonoscopy, flexible; with removal of tumor(s),                     polyp(s), or other lesion(s) by snare technique                    45380, 59, Colonoscopy, flexible; with biopsy, single or                     multiple Diagnosis Code(s): --- Professional ---                    211.3, Benign neoplasm of colon                    V12.72, Personal history of colonic polyps                    562.10, Diverticulosis of colon (without mention of                     hemorrhage) CPT copyright 2014 American Medical Association. All rights reserved. The codes documented in this report are preliminary and upon coder review may  be revised to meet current compliance requirements. Lollie Sails, MD 09/23/2014 10:27:15 AM This report has been signed electronically. Number of Addenda: 0 Note Initiated On: 09/23/2014 9:05 AM Scope  Withdrawal Time: 0 hours 48 minutes 17 seconds  Total Procedure Duration: 1 hour 9 minutes 29 seconds       [facility]

## 2014-09-24 ENCOUNTER — Telehealth: Payer: Self-pay | Admitting: Family Medicine

## 2014-09-24 ENCOUNTER — Encounter: Payer: Self-pay | Admitting: Family Medicine

## 2014-09-24 LAB — SURGICAL PATHOLOGY

## 2014-09-24 MED ORDER — HYDROCODONE-ACETAMINOPHEN 5-325 MG PO TABS: 1.0000 | ORAL_TABLET | Freq: Four times a day (QID) | ORAL | Status: DC | PRN

## 2014-09-24 NOTE — Telephone Encounter (Signed)
Pt advised.   Thanks,   -Laura  

## 2014-09-24 NOTE — Telephone Encounter (Signed)
Printed rx. Please notify patient. Thanks.

## 2014-09-25 ENCOUNTER — Encounter: Payer: Self-pay | Admitting: Gastroenterology

## 2014-10-28 ENCOUNTER — Ambulatory Visit (INDEPENDENT_AMBULATORY_CARE_PROVIDER_SITE_OTHER): Payer: Medicare Other | Admitting: Cardiovascular Disease

## 2014-10-28 ENCOUNTER — Encounter: Payer: Self-pay | Admitting: Cardiovascular Disease

## 2014-10-28 VITALS — BP 128/84 | HR 68 | Ht 65.0 in | Wt 165.0 lb

## 2014-10-28 DIAGNOSIS — I10 Essential (primary) hypertension: Secondary | ICD-10-CM

## 2014-10-28 DIAGNOSIS — E1122 Type 2 diabetes mellitus with diabetic chronic kidney disease: Secondary | ICD-10-CM | POA: Insufficient documentation

## 2014-10-28 DIAGNOSIS — E1121 Type 2 diabetes mellitus with diabetic nephropathy: Secondary | ICD-10-CM | POA: Insufficient documentation

## 2014-10-28 DIAGNOSIS — Z720 Tobacco use: Secondary | ICD-10-CM

## 2014-10-28 DIAGNOSIS — R0789 Other chest pain: Secondary | ICD-10-CM | POA: Diagnosis not present

## 2014-10-28 DIAGNOSIS — N189 Chronic kidney disease, unspecified: Secondary | ICD-10-CM

## 2014-10-28 DIAGNOSIS — E785 Hyperlipidemia, unspecified: Secondary | ICD-10-CM

## 2014-10-28 DIAGNOSIS — F17200 Nicotine dependence, unspecified, uncomplicated: Secondary | ICD-10-CM

## 2014-10-28 DIAGNOSIS — I2510 Atherosclerotic heart disease of native coronary artery without angina pectoris: Secondary | ICD-10-CM | POA: Diagnosis not present

## 2014-10-28 MED ORDER — CLOPIDOGREL BISULFATE 75 MG PO TABS: 75.0000 mg | ORAL_TABLET | Freq: Every day | ORAL | Status: DC

## 2014-10-28 MED ORDER — NITROGLYCERIN 0.4 MG SL SUBL: 0.4000 mg | SUBLINGUAL_TABLET | SUBLINGUAL | Status: DC | PRN

## 2014-10-28 MED ORDER — RANITIDINE HCL 150 MG PO TABS: 150.0000 mg | ORAL_TABLET | Freq: Two times a day (BID) | ORAL | Status: DC

## 2014-10-28 MED ORDER — METOPROLOL TARTRATE 25 MG PO TABS: 25.0000 mg | ORAL_TABLET | Freq: Two times a day (BID) | ORAL | Status: DC

## 2014-10-28 MED ORDER — LISINOPRIL 40 MG PO TABS: 40.0000 mg | ORAL_TABLET | Freq: Every day | ORAL | Status: DC

## 2014-10-28 MED ORDER — ROSUVASTATIN CALCIUM 40 MG PO TABS
40.0000 mg | ORAL_TABLET | Freq: Every day | ORAL | Status: DC
Start: 2014-10-28 — End: 2015-11-19

## 2014-10-28 NOTE — Assessment & Plan Note (Signed)
>>  ASSESSMENT AND PLAN FOR TOBACCO USE DISORDER WRITTEN ON 10/28/2014 12:56 PM BY Antonieta Iba, MD  We have encouraged her to continue to work on weaning her cigarettes and smoking cessation. She will continue to work on this and does not want any assistance with chantix.

## 2014-10-28 NOTE — Assessment & Plan Note (Signed)
We have encouraged her to continue to work on weaning her cigarettes and smoking cessation. She will continue to work on this and does not want any assistance with chantix.  

## 2014-10-28 NOTE — Assessment & Plan Note (Signed)
We have encouraged continued exercise, careful diet management in an effort to lose weight. 

## 2014-10-28 NOTE — Assessment & Plan Note (Signed)
Currently with no symptoms of angina. No further workup at this time. Continue current medication regimen. 

## 2014-10-28 NOTE — Assessment & Plan Note (Signed)
Blood pressure is well controlled on today's visit. No changes made to the medications. 

## 2014-10-28 NOTE — Patient Instructions (Signed)
You are doing well. No medication changes were made.  Please call us if you have new issues that need to be addressed before your next appt.  Your physician wants you to follow-up in: 6 months.  You will receive a reminder letter in the mail two months in advance. If you don't receive a letter, please call our office to schedule the follow-up appointment.   

## 2014-10-28 NOTE — Assessment & Plan Note (Signed)
Cholesterol is at goal on the current lipid regimen. No changes to the medications were made.  

## 2014-10-28 NOTE — Assessment & Plan Note (Signed)
>>  ASSESSMENT AND PLAN FOR DIABETES MELLITUS WITH DIABETIC NEPHROPATHY (HCC) WRITTEN ON 10/28/2014 12:56 PM BY PERLA EVALENE PARAS, MD  We have encouraged continued exercise, careful diet management in an effort to lose weight.

## 2014-10-28 NOTE — Progress Notes (Signed)
Patient ID: Hannah Kirby, female    DOB: 05/07/1945, 69 y.o.   MRN: [id number]  HPI Comments: [name] is a  69 year-old woman with a history of diabetes, obesity, significantly improved with recent weight loss on  Victoza, coronary artery disease with stenting of her RCA  in January 2008, long smoking history who continues to smoke, hyperlipidemia who presents for routine followup of her coronary artery disease. outbreak of her granulomatous disease in summary 2013,  prednisone for this condition.  History pinched nerve and is on pain medication.  In follow-up today, she reports having several medical issues over the past several months She reports having some atypical type chest discomfort in June. At that time had elevated creatinine 1.7. She was instructed to hold the metformin and omeprazole as this could affect her kidney function. Diabetes numbers have stayed around the same. She was given IV fluids in the hospital, creatinine improved down to 1.5, baseline 1.2.  She had some stress test for chest pain 07/29/2014 that showed no ischemia Also had problems with her teeth [place] She continues to smoke Chronic back pain  EKG on today's visit shows normal sinus rhythm with rate 68 bpm, nonspecific ST and T wave abnormality anterolateral leads  Other past medical history  lab work showing total cholesterol 141, hemoglobin A1c 6.8  She is working with Dr. Eddie Dibbles on her diabetes.        Allergies  Allergen Reactions  . Sulfonamide Derivatives Other (See Comments)    Last taken as a child; made her mouth break out  . Codeine Nausea Only  . Prednisone Palpitations and Other (See Comments)    Made her legs "feel weird."    Outpatient Encounter Prescriptions as of 10/28/2014  Medication Sig  . aspirin (ASPIR-81) 81 MG EC tablet Take 81 mg by mouth daily.    . calcium carbonate (OS-CAL) 600 MG TABS Take 600 mg by  mouth every evening.   . chlorhexidine (PERIDEX) 0.12 % solution Use as directed 15 mLs in the mouth or throat 2 (two) times daily. (Patient taking differently: Use as directed 15 mLs in the mouth or throat as needed. )  . clopidogrel (PLAVIX) 75 MG tablet Take 1 tablet (75 mg total) by mouth daily.  . Coenzyme Q10 (CO Q 10) 100 MG CAPS Take by mouth.  Marland Kitchen HYDROcodone-acetaminophen (NORCO/VICODIN) 5-325 MG per tablet Take 1 tablet by mouth every 6 (six) hours as needed. To be filled after 11/24/2014  . Liraglutide (VICTOZA) 18 MG/3ML SOLN injection Inject 1.2 mg into the skin daily.   Marland Kitchen lisinopril (PRINIVIL,ZESTRIL) 40 MG tablet Take 1 tablet (40 mg total) by mouth daily.  . meclizine (ANTIVERT) 25 MG tablet Take by mouth as needed.   . metoprolol tartrate (LOPRESSOR) 25 MG tablet Take 1 tablet (25 mg total) by mouth 2 (two) times daily.  . Multiple Vitamins-Minerals (DAILY MULTI) TABS Take 1 tablet by mouth daily.   . nitroGLYCERIN (NITROSTAT) 0.4 MG SL tablet Place 1 tablet (0.4 mg total) under the tongue every 5 (five) minutes as needed.  . Omega-3 Fatty Acids (FISH OIL) 1000 MG CAPS Take 2 capsules by mouth 2 (two) times daily. Take 4  . ondansetron (ZOFRAN) 4 MG tablet Take 4 mg by mouth every 8 (eight) hours as needed for nausea or vomiting.  . ranitidine (ZANTAC) 150 MG tablet Take 1 tablet (150 mg total) by mouth 2 (  two) times daily.  . rosuvastatin (CRESTOR) 40 MG tablet Take 1 tablet (40 mg total) by mouth daily.  Marland Kitchen tiotropium (SPIRIVA) 18 MCG inhalation capsule Place 18 mcg into inhaler and inhale daily.  . VENTOLIN HFA 108 (90 BASE) MCG/ACT inhaler 2 PUFFS VIA INHALATION EVERY 4 HOURS AS NEEDED  . [DISCONTINUED] clopidogrel (PLAVIX) 75 MG tablet Take 1 tablet (75 mg total) by mouth daily.  . [DISCONTINUED] lisinopril (PRINIVIL,ZESTRIL) 40 MG tablet Take 1 tablet (40 mg total) by mouth daily.  . [DISCONTINUED] metoprolol tartrate (LOPRESSOR) 25 MG tablet Take 1 tablet (25 mg total) by  mouth 2 (two) times daily.  . [DISCONTINUED] nitroGLYCERIN (NITROSTAT) 0.4 MG SL tablet Place 1 tablet (0.4 mg total) under the tongue every 5 (five) minutes as needed.  . [DISCONTINUED] ranitidine (ZANTAC) 150 MG tablet Take 1 tablet (150 mg total) by mouth 2 (two) times daily.  . [DISCONTINUED] rosuvastatin (CRESTOR) 40 MG tablet Take 1 tablet (40 mg total) by mouth daily.  . [DISCONTINUED] Calcium-Vitamin D 600-200 MG-UNIT per tablet Take by mouth.  . [DISCONTINUED] metFORMIN (GLUCOPHAGE) 1000 MG tablet Take by mouth.  . [DISCONTINUED] nicotine (NICODERM CQ - DOSED IN MG/24 HOURS) 21 mg/24hr patch Place 1 patch (21 mg total) onto the skin daily. (Patient not taking: Reported on 07/28/2014)  . [DISCONTINUED] Omeprazole 20 MG TBEC Take by mouth.   No facility-administered encounter medications on file as of 10/28/2014.    Past Medical History  Diagnosis Date  . GERD (gastroesophageal reflux disease)   . Hyperlipidemia   . Hypertension   . Hernia   . FHx: allergies   . Diabetes mellitus   . Arthritis   . Kidney stone on left side 2013  . Chronic bronchitis     secondary to cigarette smoking  . Goiter   . Microalbuminuria   . Spinal stenosis   . CAD (coronary artery disease)     a. cath 02/2006: BMS x 2 to RCA, cath o/w without significant coronary disease; b. nuclear stress test 07/2014: no signs of ischemia, no ekg changes concerning for ischemia, low risk study/normal study  . Obesity   . Granulomatous disease     Past Surgical History  Procedure Laterality Date  . Hysterectomy (other)    . Breast biopsy    . Coronary angioplasty with stent placement  2008  . Abdominal hysterectomy    . Colonoscopy with propofol N/A 09/23/2014    Procedure: COLONOSCOPY WITH PROPOFOL;  Surgeon: Lollie Sails, MD;  Location: [facility] ENDOSCOPY;  Service: Endoscopy;  Laterality: N/A;    Social History  reports that she has been smoking Cigarettes.  She has a 40 pack-year smoking history. She has  never used smokeless tobacco. She reports that she drinks alcohol. She reports that she does not use illicit drugs.  Family History family history includes Breast cancer in her maternal aunt; Congestive Heart Failure in her mother; Diabetes in her mother; Heart failure in her mother; Lung cancer in her father; Stroke in her mother.   Review of Systems  Constitutional: Negative.   Respiratory: Negative.   Cardiovascular: Negative.   Gastrointestinal: Negative.   Musculoskeletal: Positive for back pain.  Skin: Negative.   Neurological: Negative.   Hematological: Negative.   Psychiatric/Behavioral: Negative.   All other systems reviewed and are negative.   BP 128/84 mmHg  Pulse 68  Ht 5\' 5"  (1.651 m)  Wt 165 lb (74.844 kg)  BMI 27.46 kg/m2  Physical Exam  Constitutional: She is oriented  to person, place, and time. She appears well-developed and well-nourished.  HENT:  Head: Normocephalic.  Nose: Nose normal.  Mouth/Throat: Oropharynx is clear and moist.  Eyes: Conjunctivae are normal. Pupils are equal, round, and reactive to light.  Neck: Normal range of motion. Neck supple. No JVD present.  Cardiovascular: Normal rate, regular rhythm, S1 normal, S2 normal, normal heart sounds and intact distal pulses.  Exam reveals no gallop and no friction rub.   No murmur heard. Pulmonary/Chest: Effort normal and breath sounds normal. No respiratory distress. She has no wheezes. She has no rales. She exhibits no tenderness.  Abdominal: Soft. Bowel sounds are normal. She exhibits no distension. There is no tenderness.  Musculoskeletal: Normal range of motion. She exhibits no edema or tenderness.  Lymphadenopathy:    She has no cervical adenopathy.  Neurological: She is alert and oriented to person, place, and time. Coordination normal.  Skin: Skin is warm and dry. No rash noted. No erythema.  Psychiatric: She has a normal mood and affect. Her behavior is normal. Judgment and thought content  normal.    Assessment and Plan  Nursing note and vitals reviewed.

## 2014-10-30 DIAGNOSIS — E785 Hyperlipidemia, unspecified: Secondary | ICD-10-CM | POA: Diagnosis not present

## 2014-10-30 DIAGNOSIS — E1121 Type 2 diabetes mellitus with diabetic nephropathy: Secondary | ICD-10-CM | POA: Diagnosis not present

## 2014-10-30 DIAGNOSIS — R809 Proteinuria, unspecified: Secondary | ICD-10-CM | POA: Diagnosis not present

## 2014-11-01 ENCOUNTER — Ambulatory Visit (INDEPENDENT_AMBULATORY_CARE_PROVIDER_SITE_OTHER): Payer: Medicare Other

## 2014-11-01 DIAGNOSIS — Z23 Encounter for immunization: Secondary | ICD-10-CM | POA: Diagnosis not present

## 2014-11-06 DIAGNOSIS — I10 Essential (primary) hypertension: Secondary | ICD-10-CM | POA: Diagnosis not present

## 2014-11-06 DIAGNOSIS — D485 Neoplasm of uncertain behavior of skin: Secondary | ICD-10-CM | POA: Diagnosis not present

## 2014-11-06 DIAGNOSIS — E1129 Type 2 diabetes mellitus with other diabetic kidney complication: Secondary | ICD-10-CM | POA: Diagnosis not present

## 2014-11-06 DIAGNOSIS — R809 Proteinuria, unspecified: Secondary | ICD-10-CM | POA: Diagnosis not present

## 2014-11-06 DIAGNOSIS — E785 Hyperlipidemia, unspecified: Secondary | ICD-10-CM | POA: Diagnosis not present

## 2014-11-06 DIAGNOSIS — Z8639 Personal history of other endocrine, nutritional and metabolic disease: Secondary | ICD-10-CM | POA: Diagnosis not present

## 2014-11-06 DIAGNOSIS — E781 Pure hyperglyceridemia: Secondary | ICD-10-CM | POA: Diagnosis not present

## 2014-11-13 DIAGNOSIS — K649 Unspecified hemorrhoids: Secondary | ICD-10-CM | POA: Diagnosis not present

## 2014-11-13 DIAGNOSIS — R1013 Epigastric pain: Secondary | ICD-10-CM | POA: Diagnosis not present

## 2014-11-25 DIAGNOSIS — Z1231 Encounter for screening mammogram for malignant neoplasm of breast: Secondary | ICD-10-CM | POA: Diagnosis not present

## 2014-11-25 DIAGNOSIS — Z1211 Encounter for screening for malignant neoplasm of colon: Secondary | ICD-10-CM | POA: Diagnosis not present

## 2014-11-25 DIAGNOSIS — Z01419 Encounter for gynecological examination (general) (routine) without abnormal findings: Secondary | ICD-10-CM | POA: Diagnosis not present

## 2014-11-25 DIAGNOSIS — Z9189 Other specified personal risk factors, not elsewhere classified: Secondary | ICD-10-CM | POA: Diagnosis not present

## 2014-12-01 DIAGNOSIS — N183 Chronic kidney disease, stage 3 (moderate): Secondary | ICD-10-CM | POA: Diagnosis not present

## 2014-12-01 DIAGNOSIS — I10 Essential (primary) hypertension: Secondary | ICD-10-CM | POA: Diagnosis not present

## 2014-12-01 DIAGNOSIS — R809 Proteinuria, unspecified: Secondary | ICD-10-CM | POA: Diagnosis not present

## 2014-12-01 DIAGNOSIS — E1122 Type 2 diabetes mellitus with diabetic chronic kidney disease: Secondary | ICD-10-CM | POA: Diagnosis not present

## 2014-12-02 DIAGNOSIS — L570 Actinic keratosis: Secondary | ICD-10-CM | POA: Diagnosis not present

## 2014-12-29 ENCOUNTER — Encounter: Payer: Self-pay | Admitting: Family Medicine

## 2014-12-29 ENCOUNTER — Other Ambulatory Visit: Payer: Self-pay | Admitting: Family Medicine

## 2014-12-29 ENCOUNTER — Encounter: Payer: Self-pay | Admitting: *Deleted

## 2014-12-29 DIAGNOSIS — D044 Carcinoma in situ of skin of scalp and neck: Secondary | ICD-10-CM | POA: Diagnosis not present

## 2014-12-29 DIAGNOSIS — M545 Low back pain: Secondary | ICD-10-CM

## 2014-12-29 DIAGNOSIS — J449 Chronic obstructive pulmonary disease, unspecified: Secondary | ICD-10-CM

## 2014-12-29 DIAGNOSIS — Z1283 Encounter for screening for malignant neoplasm of skin: Secondary | ICD-10-CM | POA: Diagnosis not present

## 2014-12-29 DIAGNOSIS — D485 Neoplasm of uncertain behavior of skin: Secondary | ICD-10-CM | POA: Diagnosis not present

## 2014-12-29 DIAGNOSIS — Z872 Personal history of diseases of the skin and subcutaneous tissue: Secondary | ICD-10-CM | POA: Diagnosis not present

## 2014-12-29 DIAGNOSIS — L720 Epidermal cyst: Secondary | ICD-10-CM | POA: Diagnosis not present

## 2014-12-29 MED ORDER — TIOTROPIUM BROMIDE MONOHYDRATE 18 MCG IN CAPS: 18.0000 ug | ORAL_CAPSULE | Freq: Every day | RESPIRATORY_TRACT | Status: DC

## 2014-12-29 MED ORDER — ALBUTEROL SULFATE HFA 108 (90 BASE) MCG/ACT IN AERS: 2.0000 | INHALATION_SPRAY | RESPIRATORY_TRACT | Status: DC | PRN

## 2014-12-29 MED ORDER — HYDROCODONE-ACETAMINOPHEN 5-325 MG PO TABS: 1.0000 | ORAL_TABLET | Freq: Four times a day (QID) | ORAL | Status: DC | PRN

## 2014-12-29 NOTE — Telephone Encounter (Signed)
Printed. I  Already sent patient a message. Thanks.

## 2014-12-30 ENCOUNTER — Ambulatory Visit: Payer: Medicare Other | Admitting: Anesthesiology

## 2014-12-30 ENCOUNTER — Other Ambulatory Visit: Payer: Self-pay | Admitting: Gastroenterology

## 2014-12-30 ENCOUNTER — Encounter: Payer: Self-pay | Admitting: Anesthesiology

## 2014-12-30 ENCOUNTER — Other Ambulatory Visit: Payer: Self-pay

## 2014-12-30 ENCOUNTER — Ambulatory Visit
Admission: RE | Admit: 2014-12-30 | Discharge: 2014-12-30 | Disposition: A | Discharge status: Home / Self Care | Payer: Medicare Other | Source: Ambulatory Visit | Attending: Gastroenterology | Admitting: Gastroenterology

## 2014-12-30 ENCOUNTER — Encounter
Admission: RE | Disposition: A | Discharge status: Home / Self Care | Payer: Self-pay | Source: Ambulatory Visit | Attending: Gastroenterology

## 2014-12-30 DIAGNOSIS — R1011 Right upper quadrant pain: Secondary | ICD-10-CM

## 2014-12-30 DIAGNOSIS — E785 Hyperlipidemia, unspecified: Secondary | ICD-10-CM | POA: Diagnosis not present

## 2014-12-30 DIAGNOSIS — Z7951 Long term (current) use of inhaled steroids: Secondary | ICD-10-CM | POA: Insufficient documentation

## 2014-12-30 DIAGNOSIS — J449 Chronic obstructive pulmonary disease, unspecified: Secondary | ICD-10-CM

## 2014-12-30 DIAGNOSIS — K2970 Gastritis, unspecified, without bleeding: Secondary | ICD-10-CM | POA: Diagnosis not present

## 2014-12-30 DIAGNOSIS — M1990 Unspecified osteoarthritis, unspecified site: Secondary | ICD-10-CM | POA: Diagnosis not present

## 2014-12-30 DIAGNOSIS — E119 Type 2 diabetes mellitus without complications: Secondary | ICD-10-CM | POA: Insufficient documentation

## 2014-12-30 DIAGNOSIS — Z882 Allergy status to sulfonamides status: Secondary | ICD-10-CM | POA: Diagnosis not present

## 2014-12-30 DIAGNOSIS — K449 Diaphragmatic hernia without obstruction or gangrene: Secondary | ICD-10-CM | POA: Insufficient documentation

## 2014-12-30 DIAGNOSIS — K2270 Barrett's esophagus without dysplasia: Secondary | ICD-10-CM | POA: Diagnosis not present

## 2014-12-30 DIAGNOSIS — E049 Nontoxic goiter, unspecified: Secondary | ICD-10-CM | POA: Insufficient documentation

## 2014-12-30 DIAGNOSIS — K2960 Other gastritis without bleeding: Secondary | ICD-10-CM | POA: Insufficient documentation

## 2014-12-30 DIAGNOSIS — Z79899 Other long term (current) drug therapy: Secondary | ICD-10-CM | POA: Insufficient documentation

## 2014-12-30 DIAGNOSIS — Z888 Allergy status to other drugs, medicaments and biological substances status: Secondary | ICD-10-CM | POA: Diagnosis not present

## 2014-12-30 DIAGNOSIS — Z7984 Long term (current) use of oral hypoglycemic drugs: Secondary | ICD-10-CM | POA: Insufficient documentation

## 2014-12-30 DIAGNOSIS — R0789 Other chest pain: Secondary | ICD-10-CM | POA: Insufficient documentation

## 2014-12-30 DIAGNOSIS — Z7982 Long term (current) use of aspirin: Secondary | ICD-10-CM | POA: Insufficient documentation

## 2014-12-30 DIAGNOSIS — M4800 Spinal stenosis, site unspecified: Secondary | ICD-10-CM | POA: Diagnosis not present

## 2014-12-30 DIAGNOSIS — Z6827 Body mass index (BMI) 27.0-27.9, adult: Secondary | ICD-10-CM | POA: Insufficient documentation

## 2014-12-30 DIAGNOSIS — R1013 Epigastric pain: Secondary | ICD-10-CM | POA: Diagnosis not present

## 2014-12-30 DIAGNOSIS — J42 Unspecified chronic bronchitis: Secondary | ICD-10-CM | POA: Diagnosis not present

## 2014-12-30 DIAGNOSIS — E669 Obesity, unspecified: Secondary | ICD-10-CM | POA: Insufficient documentation

## 2014-12-30 DIAGNOSIS — D71 Functional disorders of polymorphonuclear neutrophils: Secondary | ICD-10-CM | POA: Diagnosis not present

## 2014-12-30 DIAGNOSIS — Z87442 Personal history of urinary calculi: Secondary | ICD-10-CM | POA: Insufficient documentation

## 2014-12-30 DIAGNOSIS — K3189 Other diseases of stomach and duodenum: Secondary | ICD-10-CM | POA: Diagnosis not present

## 2014-12-30 DIAGNOSIS — D649 Anemia, unspecified: Secondary | ICD-10-CM | POA: Insufficient documentation

## 2014-12-30 DIAGNOSIS — K210 Gastro-esophageal reflux disease with esophagitis: Secondary | ICD-10-CM | POA: Diagnosis not present

## 2014-12-30 DIAGNOSIS — F17210 Nicotine dependence, cigarettes, uncomplicated: Secondary | ICD-10-CM | POA: Insufficient documentation

## 2014-12-30 DIAGNOSIS — I10 Essential (primary) hypertension: Secondary | ICD-10-CM | POA: Insufficient documentation

## 2014-12-30 DIAGNOSIS — Z885 Allergy status to narcotic agent status: Secondary | ICD-10-CM | POA: Insufficient documentation

## 2014-12-30 DIAGNOSIS — I2510 Atherosclerotic heart disease of native coronary artery without angina pectoris: Secondary | ICD-10-CM | POA: Insufficient documentation

## 2014-12-30 DIAGNOSIS — R1084 Generalized abdominal pain: Secondary | ICD-10-CM

## 2014-12-30 DIAGNOSIS — K2950 Unspecified chronic gastritis without bleeding: Secondary | ICD-10-CM | POA: Diagnosis not present

## 2014-12-30 DIAGNOSIS — R809 Proteinuria, unspecified: Secondary | ICD-10-CM | POA: Insufficient documentation

## 2014-12-30 HISTORY — DX: Chronic obstructive pulmonary disease, unspecified: J44.9

## 2014-12-30 HISTORY — DX: Anemia, unspecified: D64.9

## 2014-12-30 HISTORY — PX: ESOPHAGOGASTRODUODENOSCOPY (EGD) WITH PROPOFOL: SHX5813

## 2014-12-30 LAB — GLUCOSE, CAPILLARY: GLUCOSE-CAPILLARY: 164 mg/dL — AB (ref 65–99)

## 2014-12-30 SURGERY — ESOPHAGOGASTRODUODENOSCOPY (EGD) WITH PROPOFOL
Anesthesia: General

## 2014-12-30 MED ORDER — LIDOCAINE HCL (CARDIAC) 20 MG/ML IV SOLN
INTRAVENOUS | Status: DC | PRN
  Administered 2014-12-30: 60 mg via INTRAVENOUS

## 2014-12-30 MED ORDER — GLYCOPYRROLATE 0.2 MG/ML IJ SOLN
INTRAMUSCULAR | Status: DC | PRN
  Administered 2014-12-30: 0.2 mg via INTRAVENOUS

## 2014-12-30 MED ORDER — FENTANYL CITRATE (PF) 100 MCG/2ML IJ SOLN
INTRAMUSCULAR | Status: DC | PRN
  Administered 2014-12-30: 50 ug via INTRAVENOUS

## 2014-12-30 MED ORDER — SODIUM CHLORIDE 0.9 % IV SOLN: INTRAVENOUS | Status: DC

## 2014-12-30 MED ORDER — SODIUM CHLORIDE 0.9 % IV SOLN
INTRAVENOUS | Status: DC
  Administered 2014-12-30: 1000 mL via INTRAVENOUS

## 2014-12-30 MED ORDER — PROPOFOL 10 MG/ML IV BOLUS
INTRAVENOUS | Status: DC | PRN
  Administered 2014-12-30: 50 mg via INTRAVENOUS

## 2014-12-30 MED ORDER — TIOTROPIUM BROMIDE MONOHYDRATE 18 MCG IN CAPS: 18.0000 ug | ORAL_CAPSULE | Freq: Every day | RESPIRATORY_TRACT | Status: DC

## 2014-12-30 MED ORDER — PROPOFOL 500 MG/50ML IV EMUL
INTRAVENOUS | Status: DC | PRN
  Administered 2014-12-30: 120 ug/kg/min via INTRAVENOUS

## 2014-12-30 MED ORDER — ALBUTEROL SULFATE HFA 108 (90 BASE) MCG/ACT IN AERS: 2.0000 | INHALATION_SPRAY | RESPIRATORY_TRACT | Status: DC | PRN

## 2014-12-30 MED ORDER — MIDAZOLAM HCL 5 MG/5ML IJ SOLN
INTRAMUSCULAR | Status: DC | PRN
  Administered 2014-12-30: 1 mg via INTRAVENOUS

## 2014-12-30 NOTE — Anesthesia Preprocedure Evaluation (Signed)
Anesthesia Evaluation  Patient identified by MRN, date of birth, ID band Patient awake    Reviewed: Allergy & Precautions, NPO status , Patient's Chart, lab work & pertinent test results, reviewed documented beta blocker date and time   Airway Mallampati: II  TM Distance: >3 FB     Dental  (+) Chipped   Pulmonary COPD, Current Smoker,           Cardiovascular hypertension, Pt. on medications and Pt. on home beta blockers + CAD       Neuro/Psych  Neuromuscular disease    GI/Hepatic GERD  ,  Endo/Other  diabetes, Type 2  Renal/GU Renal InsufficiencyRenal disease     Musculoskeletal  (+) Arthritis ,   Abdominal   Peds  Hematology  (+) anemia ,   Anesthesia Other Findings   Reproductive/Obstetrics                             Anesthesia Physical Anesthesia Plan  ASA: III  Anesthesia Plan: General   Post-op Pain Management:    Induction: Intravenous  Airway Management Planned: Nasal Cannula  Additional Equipment:   Intra-op Plan:   Post-operative Plan:   Informed Consent: I have reviewed the patients History and Physical, chart, labs and discussed the procedure including the risks, benefits and alternatives for the proposed anesthesia with the patient or authorized representative who has indicated his/her understanding and acceptance.     Plan Discussed with: CRNA  Anesthesia Plan Comments:         Anesthesia Quick Evaluation

## 2014-12-30 NOTE — Op Note (Signed)
[facility] Patient Name: Hannah Kirby Procedure Date: 12/30/2014 9:17 AM MRN: [id number] Account #: [id number] Date of Birth: 1945-03-24 Admit Type: Outpatient Age: 69 Room: [facility] ENDO ROOM 3 Gender: Female Note Status: Finalized Procedure:         Upper GI endoscopy Indications:       Epigastric abdominal pain, Chest pain (non cardiac) Providers:         Lollie Sails, MD Referring MD:      Jerrell Belfast, MD (Referring MD) Medicines:         Monitored Anesthesia Care Complications:     No immediate complications. Procedure:         Pre-Anesthesia Assessment:                    - ASA Grade Assessment: III - A patient with severe                     systemic disease.                    After obtaining informed consent, the endoscope was passed                     under direct vision. Throughout the procedure, the                     patient's blood pressure, pulse, and oxygen saturations                     were monitored continuously. The Endoscope was introduced                     through the mouth, and advanced to the third part of                     duodenum. The upper GI endoscopy was accomplished without                     difficulty. The patient tolerated the procedure well. Findings:      A small hiatus hernia was present.      The Z-line was irregular. Biopsies were taken with a cold forceps for       histology.      The exam of the esophagus was otherwise normal.      Diffuse moderate inflammation characterized by congestion (edema) and       erythema was found in the gastric body and in the gastric antrum.       Biopsies were taken with a cold forceps for histology.      The examined duodenum was normal. Impression:        - Small hiatus hernia.                    - Z-line irregular. Biopsied.                    - Bile gastritis. Biopsied.                    - Normal examined duodenum. Recommendation:    - Await  pathology results. Procedure Code(s): --- Professional ---                    [phone], Esophagogastroduodenoscopy, flexible, transoral;  with biopsy, single or multiple Diagnosis Code(s): --- Professional ---                    530.89, Other specified disorders of esophagus                    535.40, Other specified gastritis, without mention of                     hemorrhage                    789.06, Abdominal pain, epigastric                    553.3, Diaphragmatic hernia without mention of obstruction                     or gangrene                    786.59, Other chest pain CPT copyright 2014 [employer]. All rights reserved. The codes documented in this report are preliminary and upon coder review may  be revised to meet current compliance requirements. Lollie Sails, MD 12/30/2014 9:41:43 AM This report has been signed electronically. Number of Addenda: 0 Note Initiated On: 12/30/2014 9:17 AM      [facility]

## 2014-12-30 NOTE — Transfer of Care (Signed)
Immediate Anesthesia Transfer of Care Note  Patient: Hannah Kirby  Procedure(s) Performed: Procedure(s): ESOPHAGOGASTRODUODENOSCOPY (EGD) WITH PROPOFOL (N/A)  Patient Location: PACU  Anesthesia Type:General  Level of Consciousness: sedated  Airway & Oxygen Therapy: Patient Spontanous Breathing and Patient connected to nasal cannula oxygen  Post-op Assessment: Report given to RN and Post -op Vital signs reviewed and stable  Post vital signs: Reviewed and stable  Last Vitals:  Filed Vitals:   12/30/14 0943  BP: 149/72  Pulse: 76  Temp: 36.2 C  Resp: 18    Complications: No apparent anesthesia complications

## 2014-12-30 NOTE — Telephone Encounter (Signed)
Pt needed a hard copy of both these prescriptions.   Thanks,   -Mickel Baas

## 2014-12-30 NOTE — Anesthesia Postprocedure Evaluation (Signed)
  Anesthesia Post-op Note  Patient: Hannah Kirby  Procedure(s) Performed: Procedure(s): ESOPHAGOGASTRODUODENOSCOPY (EGD) WITH PROPOFOL (N/A)  Anesthesia type:General  Patient location: PACU  Post pain: Pain level controlled  Post assessment: Post-op Vital signs reviewed, Patient's Cardiovascular Status Stable, Respiratory Function Stable, Patent Airway and No signs of Nausea or vomiting  Post vital signs: Reviewed and stable  Last Vitals:  Filed Vitals:   12/30/14 1010  BP: 182/92  Pulse: 76  Temp:   Resp: 16    Level of consciousness: awake, alert  and patient cooperative  Complications: No apparent anesthesia complications

## 2014-12-30 NOTE — H&P (Signed)
Outpatient short stay form Pre-procedure 12/30/2014 9:09 AM Lollie Sails MD  Primary Physician: Dr. Margarita Rana  Reason for visit:  EGD  History of present illness:  Patient is a 69 year old female presenting today for EGD due to episodic atypical chest pain and epigastric pain. This seems to radiate through to her back as well. She has had cardiology evaluation which was negative for new issues. She  had been taking a combination of Pepcid and Zantac after having taken omeprazole for a long time. She was taken off of that medication by her renal physician. However she is now had an exacerbation of epigastric symptoms. He has switched herself back to the omeprazole recently.    Current facility-administered medications:  .  0.9 %  sodium chloride infusion, , Intravenous, Continuous, Lollie Sails, MD, Last Rate: 20 mL/hr at 12/30/14 0803, 1,000 mL at 12/30/14 0803 .  0.9 %  sodium chloride infusion, , Intravenous, Continuous, Lollie Sails, MD  Prescriptions prior to admission  Medication Sig Dispense Refill Last Dose  . albuterol (VENTOLIN HFA) 108 (90 BASE) MCG/ACT inhaler Inhale 2 puffs into the lungs every 4 (four) hours as needed for wheezing or shortness of breath. 18 g 5 12/29/2014 at Unknown time  . aspirin (ASPIR-81) 81 MG EC tablet Take 81 mg by mouth daily.     Past Week at Unknown time  . calcium carbonate (OS-CAL) 600 MG TABS Take 600 mg by mouth every evening.    12/29/2014 at Unknown time  . chlorhexidine (PERIDEX) 0.12 % solution Use as directed 15 mLs in the mouth or throat 2 (two) times daily. (Patient taking differently: Use as directed 15 mLs in the mouth or throat as needed. ) 120 mL 6 12/29/2014 at Unknown time  . clopidogrel (PLAVIX) 75 MG tablet Take 1 tablet (75 mg total) by mouth daily. 90 tablet 3 Past Week at Unknown time  . Coenzyme Q10 (CO Q 10) 100 MG CAPS Take by mouth.   Past Week at Unknown time  . HYDROcodone-acetaminophen (NORCO/VICODIN) 5-325 MG  tablet Take 1 tablet by mouth every 6 (six) hours as needed. To be filled after 11/24/2014 120 tablet 0 12/30/2014 at 0700  . Liraglutide (VICTOZA) 18 MG/3ML SOLN injection Inject 1.2 mg into the skin daily.    12/29/2014 at Unknown time  . lisinopril (PRINIVIL,ZESTRIL) 40 MG tablet Take 1 tablet (40 mg total) by mouth daily. 90 tablet 3 12/30/2014 at 0700  . meclizine (ANTIVERT) 25 MG tablet Take by mouth as needed.    12/29/2014 at Unknown time  . metFORMIN (GLUCOPHAGE) 1000 MG tablet Take 1,000 mg by mouth 2 (two) times daily with a meal.   Past Week at Unknown time  . metoprolol tartrate (LOPRESSOR) 25 MG tablet Take 1 tablet (25 mg total) by mouth 2 (two) times daily. 180 tablet 3 12/30/2014 at 0700  . Multiple Vitamins-Minerals (DAILY MULTI) TABS Take 1 tablet by mouth daily.    Past Week at Unknown time  . nitroGLYCERIN (NITROSTAT) 0.4 MG SL tablet Place 1 tablet (0.4 mg total) under the tongue every 5 (five) minutes as needed. 25 tablet 6 Past Month at Unknown time  . Omega-3 Fatty Acids (FISH OIL) 1000 MG CAPS Take 2 capsules by mouth 2 (two) times daily. Take 4   Past Week at Unknown time  . omeprazole (PRILOSEC) 20 MG capsule Take 20 mg by mouth 2 (two) times daily before a meal.     . ondansetron (ZOFRAN) 4 MG  tablet Take 4 mg by mouth every 8 (eight) hours as needed for nausea or vomiting.   12/29/2014 at Unknown time  . ranitidine (ZANTAC) 150 MG tablet Take 1 tablet (150 mg total) by mouth 2 (two) times daily. 360 tablet 3 12/29/2014 at Unknown time  . rosuvastatin (CRESTOR) 40 MG tablet Take 1 tablet (40 mg total) by mouth daily. 90 tablet 3 12/29/2014 at Unknown time  . tiotropium (SPIRIVA) 18 MCG inhalation capsule Place 1 capsule (18 mcg total) into inhaler and inhale daily. 30 capsule 5 12/29/2014 at Unknown time     Allergies  Allergen Reactions  . Sulfonamide Derivatives Other (See Comments)    Last taken as a child; made her mouth break out  . Codeine Nausea Only  . Prednisone  Palpitations and Other (See Comments)    Made her legs "feel weird."     Past Medical History  Diagnosis Date  . GERD (gastroesophageal reflux disease)   . Hyperlipidemia   . Hypertension   . Hernia   . FHx: allergies   . Diabetes mellitus   . Arthritis   . Kidney stone on left side 2013  . Chronic bronchitis ([place])     secondary to cigarette smoking  . Goiter   . Microalbuminuria   . Spinal stenosis   . CAD (coronary artery disease)     a. cath 02/2006: BMS x 2 to RCA, cath o/w without significant coronary disease; b. nuclear stress test 07/2014: no signs of ischemia, no ekg changes concerning for ischemia, low risk study/normal study  . Obesity   . Granulomatous disease ([place])   . Anemia   . COPD (chronic obstructive pulmonary disease) ([facility])     Review of systems:      Physical Exam    Heart and lungs: Regular rate and rhythm without rub or gallop lungs are bilaterally clear    HEENT: Normocephalic atraumatic eyes are anicteric    Other:     Pertinant exam for procedure: Soft nontender nondistended bowel sounds positive normoactive.    Planned proceedures: EGD and indicated procedures. I have discussed the risks benefits and complications of procedures to include not limited to bleeding, infection, perforation and the risk of sedation and the patient wishes to proceed.    Lollie Sails, MD Gastroenterology 12/30/2014  9:09 AM

## 2015-01-01 LAB — SURGICAL PATHOLOGY

## 2015-01-20 ENCOUNTER — Ambulatory Visit
Admission: RE | Admit: 2015-01-20 | Discharge: 2015-01-20 | Disposition: A | Discharge status: Home / Self Care | Payer: Medicare Other | Source: Ambulatory Visit | Attending: Gastroenterology | Admitting: Gastroenterology

## 2015-01-20 DIAGNOSIS — Q6102 Congenital multiple renal cysts: Secondary | ICD-10-CM | POA: Diagnosis not present

## 2015-01-20 DIAGNOSIS — R1084 Generalized abdominal pain: Secondary | ICD-10-CM

## 2015-01-20 DIAGNOSIS — R1011 Right upper quadrant pain: Secondary | ICD-10-CM

## 2015-01-20 DIAGNOSIS — N200 Calculus of kidney: Secondary | ICD-10-CM | POA: Diagnosis not present

## 2015-01-20 DIAGNOSIS — R109 Unspecified abdominal pain: Secondary | ICD-10-CM | POA: Diagnosis not present

## 2015-01-20 MED ORDER — TECHNETIUM TC 99M MEBROFENIN IV KIT
5.0000 | PACK | Freq: Once | INTRAVENOUS | Status: AC | PRN
  Administered 2015-01-20: 5.22 via INTRAVENOUS

## 2015-01-20 MED ORDER — SINCALIDE 5 MCG IJ SOLR
0.0200 ug/kg | Freq: Once | INTRAMUSCULAR | Status: AC
Start: 2015-01-20 — End: 2015-01-20
  Administered 2015-01-20: 1.43 ug via INTRAVENOUS

## 2015-01-21 DIAGNOSIS — C4442 Squamous cell carcinoma of skin of scalp and neck: Secondary | ICD-10-CM | POA: Diagnosis not present

## 2015-02-02 ENCOUNTER — Other Ambulatory Visit: Payer: Self-pay | Admitting: Gastroenterology

## 2015-02-02 DIAGNOSIS — R1013 Epigastric pain: Secondary | ICD-10-CM | POA: Diagnosis not present

## 2015-02-02 DIAGNOSIS — K2270 Barrett's esophagus without dysplasia: Secondary | ICD-10-CM | POA: Diagnosis not present

## 2015-02-03 ENCOUNTER — Telehealth: Payer: Self-pay | Admitting: Family Medicine

## 2015-02-03 ENCOUNTER — Encounter: Payer: Self-pay | Admitting: Family Medicine

## 2015-02-03 DIAGNOSIS — M545 Low back pain: Secondary | ICD-10-CM

## 2015-02-03 DIAGNOSIS — R911 Solitary pulmonary nodule: Secondary | ICD-10-CM

## 2015-02-03 MED ORDER — HYDROCODONE-ACETAMINOPHEN 5-325 MG PO TABS: 1.0000 | ORAL_TABLET | Freq: Four times a day (QID) | ORAL | Status: DC | PRN

## 2015-02-03 NOTE — Telephone Encounter (Signed)
Ordered scan and printed out prescriptions. Please notify patient. Thanks.

## 2015-02-04 DIAGNOSIS — R809 Proteinuria, unspecified: Secondary | ICD-10-CM | POA: Diagnosis not present

## 2015-02-04 DIAGNOSIS — E1129 Type 2 diabetes mellitus with other diabetic kidney complication: Secondary | ICD-10-CM | POA: Diagnosis not present

## 2015-02-06 ENCOUNTER — Encounter

## 2015-02-09 ENCOUNTER — Encounter: Payer: Self-pay | Admitting: Family Medicine

## 2015-02-09 ENCOUNTER — Ambulatory Visit

## 2015-02-09 ENCOUNTER — Telehealth: Payer: Self-pay | Admitting: Family Medicine

## 2015-02-09 DIAGNOSIS — R911 Solitary pulmonary nodule: Secondary | ICD-10-CM

## 2015-02-12 NOTE — Telephone Encounter (Signed)
Changed order.

## 2015-02-26 ENCOUNTER — Ambulatory Visit

## 2015-03-02 ENCOUNTER — Ambulatory Visit
Admission: RE | Admit: 2015-03-02 | Discharge: 2015-03-02 | Disposition: A | Discharge status: Home / Self Care | Payer: Medicare Other | Source: Ambulatory Visit | Attending: Family Medicine | Admitting: Family Medicine

## 2015-03-02 DIAGNOSIS — R911 Solitary pulmonary nodule: Secondary | ICD-10-CM

## 2015-03-02 DIAGNOSIS — I700 Atherosclerosis of aorta: Secondary | ICD-10-CM | POA: Insufficient documentation

## 2015-03-02 DIAGNOSIS — M5134 Other intervertebral disc degeneration, thoracic region: Secondary | ICD-10-CM | POA: Diagnosis not present

## 2015-03-03 ENCOUNTER — Telehealth: Payer: Self-pay

## 2015-03-03 NOTE — Telephone Encounter (Signed)
Left message to call back  

## 2015-03-03 NOTE — Telephone Encounter (Signed)
-----   Message from Margarita Rana, MD sent at 03/02/2015  3:45 PM EST ----- Stable nodule. Recheck in 6 months.  Thanks.

## 2015-03-04 DIAGNOSIS — C4442 Squamous cell carcinoma of skin of scalp and neck: Secondary | ICD-10-CM | POA: Diagnosis not present

## 2015-03-04 NOTE — Telephone Encounter (Signed)
Patient advised as directed below. Patient verbalized understanding.  

## 2015-04-02 DIAGNOSIS — E785 Hyperlipidemia, unspecified: Secondary | ICD-10-CM | POA: Diagnosis not present

## 2015-04-02 DIAGNOSIS — E1165 Type 2 diabetes mellitus with hyperglycemia: Secondary | ICD-10-CM | POA: Diagnosis not present

## 2015-04-02 DIAGNOSIS — I10 Essential (primary) hypertension: Secondary | ICD-10-CM | POA: Diagnosis not present

## 2015-04-02 DIAGNOSIS — E781 Pure hyperglyceridemia: Secondary | ICD-10-CM | POA: Diagnosis not present

## 2015-04-02 DIAGNOSIS — Z8639 Personal history of other endocrine, nutritional and metabolic disease: Secondary | ICD-10-CM | POA: Diagnosis not present

## 2015-04-02 DIAGNOSIS — N183 Chronic kidney disease, stage 3 (moderate): Secondary | ICD-10-CM | POA: Diagnosis not present

## 2015-04-02 DIAGNOSIS — E1122 Type 2 diabetes mellitus with diabetic chronic kidney disease: Secondary | ICD-10-CM | POA: Diagnosis not present

## 2015-04-15 DIAGNOSIS — Z789 Other specified health status: Secondary | ICD-10-CM | POA: Diagnosis not present

## 2015-04-15 DIAGNOSIS — R208 Other disturbances of skin sensation: Secondary | ICD-10-CM | POA: Diagnosis not present

## 2015-04-15 DIAGNOSIS — R238 Other skin changes: Secondary | ICD-10-CM | POA: Diagnosis not present

## 2015-04-15 DIAGNOSIS — L821 Other seborrheic keratosis: Secondary | ICD-10-CM | POA: Diagnosis not present

## 2015-04-15 DIAGNOSIS — B078 Other viral warts: Secondary | ICD-10-CM | POA: Diagnosis not present

## 2015-04-15 DIAGNOSIS — C4492 Squamous cell carcinoma of skin, unspecified: Secondary | ICD-10-CM | POA: Diagnosis not present

## 2015-04-15 DIAGNOSIS — L538 Other specified erythematous conditions: Secondary | ICD-10-CM | POA: Diagnosis not present

## 2015-04-27 DIAGNOSIS — J018 Other acute sinusitis: Secondary | ICD-10-CM | POA: Diagnosis not present

## 2015-04-28 ENCOUNTER — Ambulatory Visit: Admitting: Cardiovascular Disease

## 2015-05-05 DIAGNOSIS — K210 Gastro-esophageal reflux disease with esophagitis: Secondary | ICD-10-CM | POA: Diagnosis not present

## 2015-05-06 ENCOUNTER — Ambulatory Visit (INDEPENDENT_AMBULATORY_CARE_PROVIDER_SITE_OTHER): Payer: Medicare Other | Admitting: Cardiovascular Disease

## 2015-05-06 ENCOUNTER — Encounter: Payer: Self-pay | Admitting: Cardiovascular Disease

## 2015-05-06 VITALS — BP 140/80 | HR 77 | Ht 66.0 in | Wt 172.8 lb

## 2015-05-06 DIAGNOSIS — F17200 Nicotine dependence, unspecified, uncomplicated: Secondary | ICD-10-CM

## 2015-05-06 DIAGNOSIS — I2510 Atherosclerotic heart disease of native coronary artery without angina pectoris: Secondary | ICD-10-CM

## 2015-05-06 DIAGNOSIS — I10 Essential (primary) hypertension: Secondary | ICD-10-CM | POA: Diagnosis not present

## 2015-05-06 DIAGNOSIS — E1121 Type 2 diabetes mellitus with diabetic nephropathy: Secondary | ICD-10-CM

## 2015-05-06 DIAGNOSIS — E785 Hyperlipidemia, unspecified: Secondary | ICD-10-CM

## 2015-05-06 DIAGNOSIS — Z720 Tobacco use: Secondary | ICD-10-CM

## 2015-05-06 DIAGNOSIS — R079 Chest pain, unspecified: Secondary | ICD-10-CM | POA: Diagnosis not present

## 2015-05-06 DIAGNOSIS — E669 Obesity, unspecified: Secondary | ICD-10-CM

## 2015-05-06 MED ORDER — NITROGLYCERIN 0.4 MG SL SUBL: 0.4000 mg | SUBLINGUAL_TABLET | SUBLINGUAL | Status: DC | PRN

## 2015-05-06 MED ORDER — EZETIMIBE 10 MG PO TABS: 10.0000 mg | ORAL_TABLET | Freq: Every day | ORAL | Status: DC

## 2015-05-06 NOTE — Assessment & Plan Note (Signed)
>>  ASSESSMENT AND PLAN FOR OBESE WRITTEN ON 05/06/2015  2:15 PM BY Antonieta Iba, MD  We have encouraged continued exercise, careful diet management in an effort to lose weight.

## 2015-05-06 NOTE — Patient Instructions (Addendum)
You are doing well.  Please start zetia one a day for cholestero  Try tums, generic zantac for chest pain NTG as well  Please call us if you have new issues that need to be addressed before your next appt.  Your physician wants you to follow-up in: 6 months.  You will receive a reminder letter in the mail two months in advance. If you don't receive a letter, please call our office to schedule the follow-up appointment.  Steps to Quit Smoking  Smoking tobacco can be harmful to your health and can affect almost every organ in your body. Smoking puts you, and those around you, at risk for developing many serious chronic diseases. Quitting smoking is difficult, but it is one of the best things that you can do for your health. It is never too late to quit. WHAT ARE THE BENEFITS OF QUITTING SMOKING? When you quit smoking, you lower your risk of developing serious diseases and conditions, such as:  Lung cancer or lung disease, such as COPD.  Heart disease.  Stroke.  Heart attack.  Infertility.  Osteoporosis and bone fractures. Additionally, symptoms such as coughing, wheezing, and shortness of breath may get better when you quit. You may also find that you get sick less often because your body is stronger at fighting off colds and infections. If you are pregnant, quitting smoking can help to reduce your chances of having a baby of low birth weight. HOW DO I GET READY TO QUIT? When you decide to quit smoking, create a plan to make sure that you are successful. Before you quit:  Pick a date to quit. Set a date within the next two weeks to give you time to prepare.  Write down the reasons why you are quitting. Keep this list in places where you will see it often, such as on your bathroom mirror or in your car or wallet.  Identify the people, places, things, and activities that make you want to smoke (triggers) and avoid them. Make sure to take these actions:  Throw away all cigarettes at  home, at work, and in your car.  Throw away smoking accessories, such as Scientist, research (medical).  Clean your car and make sure to empty the ashtray.  Clean your home, including curtains and carpets.  Tell your family, friends, and coworkers that you are quitting. Support from your loved ones can make quitting easier.  Talk with your health care provider about your options for quitting smoking.  Find out what treatment options are covered by your health insurance. WHAT STRATEGIES CAN I USE TO QUIT SMOKING?  Talk with your healthcare provider about different strategies to quit smoking. Some strategies include:  Quitting smoking altogether instead of gradually lessening how much you smoke over a period of time. Research shows that quitting "cold [place]" is more successful than gradually quitting.  Attending in-person counseling to help you build problem-solving skills. You are more likely to have success in quitting if you attend several counseling sessions. Even short sessions of 10 minutes can be effective.  Finding resources and support systems that can help you to quit smoking and remain smoke-free after you quit. These resources are most helpful when you use them often. They can include:  Online chats with a Social worker.  Telephone quitlines.  Printed Furniture conservator/restorer.  Support groups or group counseling.  Text messaging programs.  Mobile phone applications.  Taking medicines to help you quit smoking. (If you are pregnant or breastfeeding, talk with  your health care provider first.) Some medicines contain nicotine and some do not. Both types of medicines help with cravings, but the medicines that include nicotine help to relieve withdrawal symptoms. Your health care provider may recommend:  Nicotine patches, gum, or lozenges.  Nicotine inhalers or sprays.  Non-nicotine medicine that is taken by mouth. Talk with your health care provider about combining strategies, such as  taking medicines while you are also receiving in-person counseling. Using these two strategies together makes you more likely to succeed in quitting than if you used either strategy on its own. If you are pregnant or breastfeeding, talk with your health care provider about finding counseling or other support strategies to quit smoking. Do not take medicine to help you quit smoking unless told to do so by your health care provider. WHAT THINGS CAN I DO TO MAKE IT EASIER TO QUIT? Quitting smoking might feel overwhelming at first, but there is a lot that you can do to make it easier. Take these important actions:  Reach out to your family and friends and ask that they support and encourage you during this time. Call telephone quitlines, reach out to support groups, or work with a counselor for support.  Ask people who smoke to avoid smoking around you.  Avoid places that trigger you to smoke, such as bars, parties, or smoke-break areas at work.  Spend time around people who do not smoke.  Lessen stress in your life, because stress can be a smoking trigger for some people. To lessen stress, try:  Exercising regularly.  Deep-breathing exercises.  Yoga.  Meditating.  Performing a body scan. This involves closing your eyes, scanning your body from head to toe, and noticing which parts of your body are particularly tense. Purposefully relax the muscles in those areas.  Download or purchase mobile phone or tablet apps (applications) that can help you stick to your quit plan by providing reminders, tips, and encouragement. There are many free apps, such as QuitGuide from the [employer] Office manager for Disease Control and Prevention). You can find other support for quitting smoking (smoking cessation) through smokefree.gov and other websites. HOW WILL I FEEL WHEN I QUIT SMOKING? Within the first 24 hours of quitting smoking, you may start to feel some withdrawal symptoms. These symptoms are usually most  noticeable 2-3 days after quitting, but they usually do not last beyond 2-3 weeks. Changes or symptoms that you might experience include:  Mood swings.  Restlessness, anxiety, or irritation.  Difficulty concentrating.  Dizziness.  Strong cravings for sugary foods in addition to nicotine.  Mild weight gain.  Constipation.  Nausea.  Coughing or a sore throat.  Changes in how your medicines work in your body.  A depressed mood.  Difficulty sleeping (insomnia). After the first 2-3 weeks of quitting, you may start to notice more positive results, such as:  Improved sense of smell and taste.  Decreased coughing and sore throat.  Slower heart rate.  Lower blood pressure.  Clearer skin.  The ability to breathe more easily.  Fewer sick days. Quitting smoking is very challenging for most people. Do not get discouraged if you are not successful the first time. Some people need to make many attempts to quit before they achieve long-term success. Do your best to stick to your quit plan, and talk with your health care provider if you have any questions or concerns.   This information is not intended to replace advice given to you by your health care provider.  Make sure you discuss any questions you have with your health care provider.   Document Released: 02/01/2001 Document Revised: 06/24/2014 Document Reviewed: 06/24/2014 Elsevier Interactive Patient Education 2016 [employer]. Smoking Hazards Smoking cigarettes is extremely bad for your health. Tobacco smoke has over 200 known poisons in it. It contains the poisonous gases nitrogen oxide and carbon monoxide. There are over 60 chemicals in tobacco smoke that cause cancer. Some of the chemicals found in cigarette smoke include:   Cyanide.   Benzene.   Formaldehyde.   Methanol (wood alcohol).   Acetylene (fuel used in welding torches).   Ammonia.  Even smoking lightly shortens your life expectancy by several  years. You can greatly reduce the risk of medical problems for you and your family by stopping now. Smoking is the most preventable cause of death and disease in our society. Within days of quitting smoking, your circulation improves, you decrease the risk of having a heart attack, and your lung capacity improves. There may be some increased phlegm in the first few days after quitting, and it may take months for your lungs to clear up completely. Quitting for 10 years reduces your risk of developing lung cancer to almost that of a nonsmoker.  WHAT ARE THE RISKS OF SMOKING? Cigarette smokers have an increased risk of many serious medical problems, including:  Lung cancer.   Lung disease (such as pneumonia, bronchitis, and emphysema).   Heart attack and chest pain due to the heart not getting enough oxygen (angina).   Heart disease and peripheral blood vessel disease.   Hypertension.   Stroke.   Oral cancer (cancer of the lip, mouth, or voice box).   Bladder cancer.   Pancreatic cancer.   Cervical cancer.   Pregnancy complications, including premature birth.   Stillbirths and smaller newborn babies, birth defects, and genetic damage to sperm.   Early menopause.   Lower estrogen level for women.   Infertility.   Facial wrinkles.   Blindness.   Increased risk of broken bones (fractures).   Senile dementia.   Stomach ulcers and internal bleeding.   Delayed wound healing and increased risk of complications during surgery. Because of secondhand smoke exposure, children of smokers have an increased risk of the following:   Sudden infant death syndrome (SIDS).   Respiratory infections.   Lung cancer.   Heart disease.   Ear infections.  WHY IS SMOKING ADDICTIVE? Nicotine is the chemical agent in tobacco that is capable of causing addiction or dependence. When you smoke and inhale, nicotine is absorbed rapidly into the bloodstream through your  lungs. Both inhaled and noninhaled nicotine may be addictive.  WHAT ARE THE BENEFITS OF QUITTING?  There are many health benefits to quitting smoking. Some are:   The likelihood of developing cancer and heart disease decreases. Health improvements are seen almost immediately.   Blood pressure, pulse rate, and breathing patterns start returning to normal soon after quitting.   People who quit may see an improvement in their overall quality of life.  HOW DO YOU QUIT SMOKING? Smoking is an addiction with both physical and psychological effects, and longtime habits can be hard to change. Your health care provider can recommend:  Programs and community resources, which may include group support, education, or therapy.  Replacement products, such as patches, gum, and nasal sprays. Use these products only as directed. Do not replace cigarette smoking with electronic cigarettes (commonly called e-cigarettes). The safety of e-cigarettes is unknown, and some may contain harmful  chemicals. FOR MORE INFORMATION  American Lung Association: www.lung.org  American Cancer Society: www.cancer.org   This information is not intended to replace advice given to you by your health care provider. Make sure you discuss any questions you have with your health care provider.   Document Released: 03/17/2004 Document Revised: 11/28/2012 Document Reviewed: 07/30/2012 Elsevier Interactive Patient Education 2016 [place] WHAT IS SECONDHAND SMOKE? Secondhand smoke is smoke that comes from burning tobacco. It could be the smoke from a cigarette, a pipe, or a cigar. Even if you are not the one smoking, secondhand smoke exposes you to the dangers of smoking. This is called involuntary, or passive, smoking. There are two types of secondhand smoke:  Sidestream smoke is the smoke that comes off the lighted end of a cigarette, pipe, or cigar.  This type of smoke has the highest amount of  cancer-causing agents (carcinogens).  The particles in sidestream smoke are smaller. They get into your lungs more easily.  Mainstream smoke is the smoke that is exhaled by a person who is smoking.  This type of smoke is also dangerous to your health. HOW CAN SECONDHAND SMOKE AFFECT MY HEALTH? Studies show that there is no safe level of secondhand smoke. This smoke contains thousands of chemicals. At least 76 of them are known to cause cancer. Secondhand smoke can also cause many other health problems. It has been linked to:  Lung cancer.  Cancer of the voice box (larynx) or throat.  Cancer of the sinuses.  Brain cancer.  Bladder cancer.  Stomach cancer.  Breast cancer.  White blood cell cancers (lymphoma and leukemia).  Brain and liver tumors in children.  Heart disease and stroke in adults.  Pregnancy loss (miscarriage).  Diseases in children, such as:  Asthma.  Lung infections.  Ear infections.  Sudden infant death syndrome (SIDS).  Slow growth. WHERE CAN I BE AT RISK FOR EXPOSURE TO SECONDHAND SMOKE?   For adults, the workplace is the main source of exposure to secondhand smoke.  Your workplace should have a policy separating smoking areas from nonsmoking areas.  Smoking areas should have a system for ventilating and cleaning the air.  For children, the home may be the most dangerous place for exposure to secondhand smoke.  Children who live in apartment buildings may be at risk from smoke drifting from hallways or other people's homes.  For everyone, many public places are possible sources of exposure to secondhand smoke.  These places include restaurants, shopping centers, and parks. HOW CAN I REDUCE MY RISK FOR EXPOSURE TO SECONDHAND SMOKE? The most important thing you can do is not smoke. Discourage family members from smoking. Other ways to reduce exposure for you and your family include the following:  Keep your home smoke free.  Make sure  your child care providers do not smoke.  Warn your child about the dangers of smoking and secondhand smoke.  Do not allow smoking in your car. When someone smokes in a car, all the damaging chemicals from the smoke are confined in a small area.  Avoid public places where smoking is allowed.   This information is not intended to replace advice given to you by your health care provider. Make sure you discuss any questions you have with your health care provider.   Document Released: 03/17/2004 Document Revised: 02/28/2014 Document Reviewed: 05/24/2013 Elsevier Interactive Patient Education [employer].

## 2015-05-06 NOTE — Assessment & Plan Note (Signed)
>>  ASSESSMENT AND PLAN FOR DIABETES MELLITUS WITH DIABETIC NEPHROPATHY (HCC) WRITTEN ON 05/06/2015  2:16 PM BY PERLA EVALENE PARAS, MD   Hemoglobin A1c above goal, recommended low carbohydrate diet,  No  Candy   Total encounter time more than 25 minutes  Greater than 50% was spent in counseling and coordination of care with the patient

## 2015-05-06 NOTE — Assessment & Plan Note (Signed)
We have encouraged continued exercise, careful diet management in an effort to lose weight. 

## 2015-05-06 NOTE — Assessment & Plan Note (Signed)
We have encouraged her to continue to work on weaning her cigarettes and smoking cessation. She will continue to work on this and does not want any assistance with chantix.  

## 2015-05-06 NOTE — Assessment & Plan Note (Signed)
Cholesterol is above goal  recommended she stay on her statin, add zetia  10 mg daily to reach goal LDL less than 70

## 2015-05-06 NOTE — Assessment & Plan Note (Signed)
Hemoglobin A1c above goal, recommended low carbohydrate diet,  No  Candy   Total encounter time more than 25 minutes  Greater than 50% was spent in counseling and coordination of care with the patient

## 2015-05-06 NOTE — Progress Notes (Signed)
Patient ID: Hannah Kirby, female    DOB: 1945/04/06, 70 y.o.   MRN: [id number]  HPI Comments: Hannah Kirby is a  70 year-old woman with a history of diabetes, obesity, significantly improved with recent weight loss on  Victoza, coronary artery disease with stenting of her RCA  in January 2008, long smoking history who continues to smoke, hyperlipidemia who presents for routine followup of her coronary artery disease. outbreak of her granulomatous disease in summary 2013,  prednisone for this condition.  History pinched nerve and is on pain medication.   in follow-up today, weight has been trending upwards  she has been eating poorly, lots of peanut butter M&Ms through the winter  Has not been exercising on a regular basis  Both cholesterol has increased him a now 177 LDL 91  Hemoglobin A1c increased now 7.5  Reports she does not drink that much , creatinine 1.4   Periodically reports having severe chest pain symptoms  Does report  Having GERD and Barrett's esophagus per the patient.  Takes omeprazole faithfully though still once per week has severe chest pain typically in the evening at rest.  She reports is been a chronic issue but otherwise has good exercise tolerance with no recent producible chest pain symptoms   she continues to smoke  Chronic back pain   EKG on today's visit shows normal sinus rhythm with rate 76 bpm, no significant ST abnormality, T wave abdomen mildly 1 and aVL  Other past medical history  stress test for chest pain 07/29/2014 that showed no ischemia  chronicproblems with her teeth   lab work showing total cholesterol 141, hemoglobin A1c 6.8  She is working with Dr. Eddie Dibbles on her diabetes.        Allergies  Allergen Reactions  . Sulfonamide Derivatives Other (See Comments)    Last taken as a child; made her mouth break out  . Codeine Nausea Only  . Prednisone Palpitations and Other (See Comments)    Made her legs "feel weird."    Outpatient  Encounter Prescriptions as of 05/06/2015  Medication Sig  . albuterol (VENTOLIN HFA) 108 (90 BASE) MCG/ACT inhaler Inhale 2 puffs into the lungs every 4 (four) hours as needed for wheezing or shortness of breath.  Marland Kitchen aspirin (ASPIR-81) 81 MG EC tablet Take 81 mg by mouth daily.    . calcium carbonate (OS-CAL) 600 MG TABS Take 600 mg by mouth every evening.   . chlorhexidine (PERIDEX) 0.12 % solution Use as directed 15 mLs in the mouth or throat 2 (two) times daily. (Patient taking differently: Use as directed 15 mLs in the mouth or throat as needed. )  . clopidogrel (PLAVIX) 75 MG tablet Take 1 tablet (75 mg total) by mouth daily.  . Coenzyme Q10 (CO Q 10) 100 MG CAPS Take by mouth.  Marland Kitchen HYDROcodone-acetaminophen (NORCO/VICODIN) 5-325 MG tablet Take 1 tablet by mouth every 6 (six) hours as needed. To be filled after 04/06/2015  . Liraglutide (VICTOZA) 18 MG/3ML SOLN injection Inject 1.2 mg into the skin daily.   Marland Kitchen lisinopril (PRINIVIL,ZESTRIL) 40 MG tablet Take 1 tablet (40 mg total) by mouth daily.  . meclizine (ANTIVERT) 25 MG tablet Take by mouth as needed.   . metFORMIN (GLUCOPHAGE) 1000 MG tablet Take 1,000 mg by mouth 2 (two) times daily with a meal.  . metoprolol tartrate (LOPRESSOR) 25 MG tablet Take 1 tablet (25 mg total) by mouth 2 (two) times daily.  . Multiple Vitamins-Minerals (DAILY MULTI)  TABS Take 1 tablet by mouth daily.   . nitroGLYCERIN (NITROSTAT) 0.4 MG SL tablet Place 1 tablet (0.4 mg total) under the tongue every 5 (five) minutes as needed.  . Omega-3 Fatty Acids (FISH OIL) 1000 MG CAPS Take 2 capsules by mouth 2 (two) times daily. Take 4  . omeprazole (PRILOSEC) 20 MG capsule Take 20 mg by mouth 2 (two) times daily before a meal.  . ondansetron (ZOFRAN) 4 MG tablet Take 4 mg by mouth every 8 (eight) hours as needed for nausea or vomiting.  . RABEprazole (ACIPHEX) 20 MG tablet Take 20 mg by mouth daily.   . rosuvastatin (CRESTOR) 40 MG tablet Take 1 tablet (40 mg total) by mouth  daily.  . sucralfate (CARAFATE) 1 g tablet Take 1 g by mouth 2 (two) times daily.   Marland Kitchen tiotropium (SPIRIVA) 18 MCG inhalation capsule Place 1 capsule (18 mcg total) into inhaler and inhale daily.  . [DISCONTINUED] ranitidine (ZANTAC) 150 MG tablet Take 1 tablet (150 mg total) by mouth 2 (two) times daily. (Patient not taking: Reported on 05/06/2015)   No facility-administered encounter medications on file as of 05/06/2015.    Past Medical History  Diagnosis Date  . GERD (gastroesophageal reflux disease)   . Hyperlipidemia   . Hypertension   . Hernia   . FHx: allergies   . Diabetes mellitus   . Arthritis   . Kidney stone on left side 2013  . Chronic bronchitis ([place])     secondary to cigarette smoking  . Goiter   . Microalbuminuria   . Spinal stenosis   . CAD (coronary artery disease)     a. cath 02/2006: BMS x 2 to RCA, cath o/w without significant coronary disease; b. nuclear stress test 07/2014: no signs of ischemia, no ekg changes concerning for ischemia, low risk study/normal study  . Obesity   . Granulomatous disease ([place])   . Anemia   . COPD (chronic obstructive pulmonary disease) [facility])     Past Surgical History  Procedure Laterality Date  . Hysterectomy (other)    . Breast biopsy    . Coronary angioplasty with stent placement  2008  . Abdominal hysterectomy    . Colonoscopy with propofol N/A 09/23/2014    Procedure: COLONOSCOPY WITH PROPOFOL;  Surgeon: Lollie Sails, MD;  Location: [facility] ENDOSCOPY;  Service: Endoscopy;  Laterality: N/A;  . Esophagogastroduodenoscopy (egd) with propofol N/A 12/30/2014    Procedure: ESOPHAGOGASTRODUODENOSCOPY (EGD) WITH PROPOFOL;  Surgeon: Lollie Sails, MD;  Location: [facility] ENDOSCOPY;  Service: Endoscopy;  Laterality: N/A;    Social History  reports that she has been smoking Cigarettes.  She has a 40 pack-year smoking history. She has never used smokeless tobacco. She reports that she drinks alcohol. She reports that she does not use  illicit drugs.  Family History family history includes Breast cancer in her maternal aunt; Congestive Heart Failure in her mother; Diabetes in her mother; Heart failure in her mother; Lung cancer in her father; Stroke in her mother.   Review of Systems  Constitutional: Negative.   Respiratory: Negative.   Cardiovascular: Negative.   Gastrointestinal: Negative.   Musculoskeletal: Positive for back pain.  Skin: Negative.   Neurological: Negative.   Hematological: Negative.   Psychiatric/Behavioral: Negative.   All other systems reviewed and are negative.   BP 140/80 mmHg  Pulse 77  Ht 5\' 6"  (1.676 m)  Wt 172 lb 12 oz (78.359 kg)  BMI 27.90 kg/m2  Physical Exam  Constitutional: She  is oriented to person, place, and time. She appears well-developed and well-nourished.  HENT:  Head: Normocephalic.  Nose: Nose normal.  Mouth/Throat: Oropharynx is clear and moist.  Eyes: Conjunctivae are normal. Pupils are equal, round, and reactive to light.  Neck: Normal range of motion. Neck supple. No JVD present.  Cardiovascular: Normal rate, regular rhythm, S1 normal, S2 normal, normal heart sounds and intact distal pulses.  Exam reveals no gallop and no friction rub.   No murmur heard. Pulmonary/Chest: Effort normal and breath sounds normal. No respiratory distress. She has no wheezes. She has no rales. She exhibits no tenderness.  Abdominal: Soft. Bowel sounds are normal. She exhibits no distension. There is no tenderness.  Musculoskeletal: Normal range of motion. She exhibits no edema or tenderness.  Lymphadenopathy:    She has no cervical adenopathy.  Neurological: She is alert and oriented to person, place, and time. Coordination normal.  Skin: Skin is warm and dry. No rash noted. No erythema.  Psychiatric: She has a normal mood and affect. Her behavior is normal. Judgment and thought content normal.    Assessment and Plan  Nursing note and vitals reviewed.

## 2015-05-06 NOTE — Assessment & Plan Note (Signed)
Symptoms suggestive of esophageal disease, even hiatal hernia  Recommended Tums, Pepcid when she has symptoms, carbonated soda  If no relief, would take nitroglycerin sublingual  Could even try levsin  If no relief

## 2015-05-06 NOTE — Assessment & Plan Note (Signed)
>>  ASSESSMENT AND PLAN FOR TOBACCO USE DISORDER WRITTEN ON 05/06/2015  2:15 PM BY Antonieta Iba, MD  We have encouraged her to continue to work on weaning her cigarettes and smoking cessation. She will continue to work on this and does not want any assistance with chantix.

## 2015-05-29 DIAGNOSIS — H2513 Age-related nuclear cataract, bilateral: Secondary | ICD-10-CM | POA: Diagnosis not present

## 2015-06-01 DIAGNOSIS — E785 Hyperlipidemia, unspecified: Secondary | ICD-10-CM | POA: Diagnosis not present

## 2015-06-01 DIAGNOSIS — E1122 Type 2 diabetes mellitus with diabetic chronic kidney disease: Secondary | ICD-10-CM | POA: Diagnosis not present

## 2015-06-01 DIAGNOSIS — N183 Chronic kidney disease, stage 3 (moderate): Secondary | ICD-10-CM | POA: Diagnosis not present

## 2015-06-01 DIAGNOSIS — I10 Essential (primary) hypertension: Secondary | ICD-10-CM | POA: Diagnosis not present

## 2015-06-01 DIAGNOSIS — R809 Proteinuria, unspecified: Secondary | ICD-10-CM | POA: Diagnosis not present

## 2015-06-02 LAB — BASIC METABOLIC PANEL
BUN: 32 mg/dL — AB (ref 4–21)
Creatinine: 1.7 mg/dL — AB (ref 0.5–1.1)
GLUCOSE: 109 mg/dL
POTASSIUM: 4.4 mmol/L (ref 3.4–5.3)
SODIUM: 139 mmol/L (ref 137–147)

## 2015-06-02 LAB — CBC AND DIFFERENTIAL
HCT: 41 % (ref 36–46)
Hemoglobin: 13.4 g/dL (ref 12.0–16.0)
Neutrophils Absolute: 4 /uL
Platelets: 276 10*3/uL (ref 150–399)
WBC: 9.8 10^3/mL

## 2015-06-05 ENCOUNTER — Encounter: Payer: Self-pay | Admitting: Family Medicine

## 2015-06-19 ENCOUNTER — Other Ambulatory Visit: Payer: Self-pay

## 2015-06-19 ENCOUNTER — Encounter: Payer: Self-pay | Admitting: Family Medicine

## 2015-06-19 DIAGNOSIS — M545 Low back pain: Secondary | ICD-10-CM

## 2015-06-19 MED ORDER — HYDROCODONE-ACETAMINOPHEN 5-325 MG PO TABS: 1.0000 | ORAL_TABLET | Freq: Four times a day (QID) | ORAL | Status: DC | PRN

## 2015-06-22 DIAGNOSIS — Z1283 Encounter for screening for malignant neoplasm of skin: Secondary | ICD-10-CM | POA: Diagnosis not present

## 2015-06-22 DIAGNOSIS — L821 Other seborrheic keratosis: Secondary | ICD-10-CM | POA: Diagnosis not present

## 2015-06-22 DIAGNOSIS — Z08 Encounter for follow-up examination after completed treatment for malignant neoplasm: Secondary | ICD-10-CM | POA: Diagnosis not present

## 2015-06-22 DIAGNOSIS — B353 Tinea pedis: Secondary | ICD-10-CM | POA: Diagnosis not present

## 2015-06-22 DIAGNOSIS — Z85828 Personal history of other malignant neoplasm of skin: Secondary | ICD-10-CM | POA: Diagnosis not present

## 2015-06-26 DIAGNOSIS — E1122 Type 2 diabetes mellitus with diabetic chronic kidney disease: Secondary | ICD-10-CM | POA: Diagnosis not present

## 2015-06-26 DIAGNOSIS — N183 Chronic kidney disease, stage 3 (moderate): Secondary | ICD-10-CM | POA: Diagnosis not present

## 2015-06-26 LAB — BASIC METABOLIC PANEL
BUN: 33 mg/dL — AB (ref 4–21)
CREATININE: 1.7 mg/dL — AB (ref 0.5–1.1)
Glucose: 131 mg/dL
POTASSIUM: 4.1 mmol/L (ref 3.4–5.3)
Sodium: 141 mmol/L (ref 137–147)

## 2015-06-26 LAB — HEMOGLOBIN A1C: HEMOGLOBIN A1C: 7.2

## 2015-07-02 DIAGNOSIS — E1122 Type 2 diabetes mellitus with diabetic chronic kidney disease: Secondary | ICD-10-CM | POA: Diagnosis not present

## 2015-07-02 DIAGNOSIS — E1165 Type 2 diabetes mellitus with hyperglycemia: Secondary | ICD-10-CM | POA: Diagnosis not present

## 2015-07-02 DIAGNOSIS — Z8639 Personal history of other endocrine, nutritional and metabolic disease: Secondary | ICD-10-CM | POA: Diagnosis not present

## 2015-07-02 DIAGNOSIS — E781 Pure hyperglyceridemia: Secondary | ICD-10-CM | POA: Diagnosis not present

## 2015-07-02 DIAGNOSIS — E785 Hyperlipidemia, unspecified: Secondary | ICD-10-CM | POA: Diagnosis not present

## 2015-07-02 DIAGNOSIS — N183 Chronic kidney disease, stage 3 (moderate): Secondary | ICD-10-CM | POA: Diagnosis not present

## 2015-07-24 ENCOUNTER — Encounter: Payer: Self-pay | Admitting: Cardiovascular Disease

## 2015-07-27 ENCOUNTER — Encounter: Payer: Self-pay | Admitting: Family Medicine

## 2015-07-27 ENCOUNTER — Ambulatory Visit (INDEPENDENT_AMBULATORY_CARE_PROVIDER_SITE_OTHER): Payer: Medicare Other | Admitting: Family Medicine

## 2015-07-27 VITALS — BP 130/80 | HR 84 | Temp 98.4°F | Resp 20 | Ht 64.5 in | Wt 168.0 lb

## 2015-07-27 DIAGNOSIS — Z1159 Encounter for screening for other viral diseases: Secondary | ICD-10-CM

## 2015-07-27 DIAGNOSIS — N183 Chronic kidney disease, stage 3 unspecified: Secondary | ICD-10-CM

## 2015-07-27 DIAGNOSIS — R05 Cough: Secondary | ICD-10-CM

## 2015-07-27 DIAGNOSIS — N1832 Chronic kidney disease, stage 3b: Secondary | ICD-10-CM | POA: Insufficient documentation

## 2015-07-27 DIAGNOSIS — J449 Chronic obstructive pulmonary disease, unspecified: Secondary | ICD-10-CM

## 2015-07-27 DIAGNOSIS — F17200 Nicotine dependence, unspecified, uncomplicated: Secondary | ICD-10-CM

## 2015-07-27 DIAGNOSIS — R911 Solitary pulmonary nodule: Secondary | ICD-10-CM | POA: Diagnosis not present

## 2015-07-27 DIAGNOSIS — M545 Low back pain: Secondary | ICD-10-CM | POA: Diagnosis not present

## 2015-07-27 DIAGNOSIS — I2510 Atherosclerotic heart disease of native coronary artery without angina pectoris: Secondary | ICD-10-CM

## 2015-07-27 DIAGNOSIS — Z720 Tobacco use: Secondary | ICD-10-CM

## 2015-07-27 DIAGNOSIS — Z0000 Encounter for general adult medical examination without abnormal findings: Secondary | ICD-10-CM

## 2015-07-27 DIAGNOSIS — R059 Cough, unspecified: Secondary | ICD-10-CM

## 2015-07-27 MED ORDER — HYDROCODONE-ACETAMINOPHEN 5-325 MG PO TABS: 1.0000 | ORAL_TABLET | Freq: Four times a day (QID) | ORAL | Status: DC | PRN

## 2015-07-27 MED ORDER — PREDNISONE 10 MG PO TABS: ORAL_TABLET | ORAL | Status: DC

## 2015-07-27 MED ORDER — TIOTROPIUM BROMIDE MONOHYDRATE 18 MCG IN CAPS: 18.0000 ug | ORAL_CAPSULE | Freq: Every day | RESPIRATORY_TRACT | Status: DC

## 2015-07-27 MED ORDER — AZITHROMYCIN 250 MG PO TABS: ORAL_TABLET | ORAL | Status: DC

## 2015-07-27 NOTE — Progress Notes (Signed)
Patient ID: Hannah Kirby, female   DOB: 04/12/1945, 70 y.o.   MRN: [id number]       Patient: Hannah Kirby, Female    DOB: 1945-03-20, 70 y.o.   MRN: [id number] Visit Date: 07/27/2015  Today's Provider: Margarita Rana, MD   Chief Complaint  Patient presents with  . Medicare Wellness   Subjective:    Annual wellness visit Hannah Kirby is a 70 y.o. female. She feels well. She reports exercising active with daily activites. She reports she is sleeping fairly well.  07/18/14 AWE 09/23/14 Colonoscopy-diverticulosis, tubular adenoma polyps, recheck in 3 yrs Dr. Epifanio Lesches -----------------------------------------------------------  Cough: Patient complains of nonproductive cough.  Symptoms began a few weeks ago.  The cough is with shortness of breath during the cough and is aggravated by pollens and reclining position Associated symptoms include:shortness of breath. Patient does not have new pets. Patient does not have a history of asthma. Patient does have a history of environmental allergens. Patient does not have recent travel. Patient does have a history of smoking. Patient  has not previous Chest X-ray.    Review of Systems  Constitutional: Negative.   HENT: Positive for congestion, postnasal drip and sinus pressure.   Eyes: Negative.   Respiratory: Positive for cough, shortness of breath and wheezing.   Cardiovascular: Negative.   Gastrointestinal: Negative.   Endocrine: Positive for heat intolerance.  Genitourinary: Positive for difficulty urinating.  Musculoskeletal: Positive for back pain and arthralgias.  Skin: Negative.   Allergic/Immunologic: Positive for environmental allergies.  Neurological: Negative.   Hematological: Bruises/bleeds easily.  Psychiatric/Behavioral: Negative.     Social History   Social History  . Marital Status: Married    Spouse Name: Hannah Kirby  . Number of Children: 3  . Years of Education: N/A   Occupational History  . Not on  file.   Social History Main Topics  . Smoking status: Current Every Day Smoker -- 1.00 packs/day for 40 years    Types: Cigarettes  . Smokeless tobacco: Never Used  . Alcohol Use: Yes     Comment: occasional  . Drug Use: No  . Sexual Activity: Not on file   Other Topics Concern  . Not on file   Social History Narrative   Retired. Married. Regularly exercise.     Past Medical History  Diagnosis Date  . GERD (gastroesophageal reflux disease)   . Hyperlipidemia   . Hypertension   . Hernia   . FHx: allergies   . Diabetes mellitus   . Arthritis   . Kidney stone on left side 2013  . Chronic bronchitis ([place])     secondary to cigarette smoking  . Goiter   . Microalbuminuria   . Spinal stenosis   . CAD (coronary artery disease)     a. cath 02/2006: BMS x 2 to RCA, cath o/w without significant coronary disease; b. nuclear stress test 07/2014: no signs of ischemia, no ekg changes concerning for ischemia, low risk study/normal study  . Obesity   . Granulomatous disease ([place])   . Anemia   . COPD (chronic obstructive pulmonary disease) [facility])      Patient Active Problem List   Diagnosis Date Noted  . Chest pain at rest 05/06/2015  . 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 Lung nodule, solitary 08/20/2014  . Allergic rhinitis 07/28/2014  . CAFL (chronic airflow limitation) ([place]) 07/28/2014  . Abnormal liver enzymes 07/28/2014  . Acid reflux 07/28/2014  . Head revolving around 07/28/2014  .  CAD (coronary artery disease)   . Obesity   . Granulomatous disease ([place])   . Back pain 11/12/2013  . Scoliosis 10/30/2013  . Lumbar radiculopathy 10/30/2013  . Lumbar canal stenosis 10/30/2013  . Calcium blood increased 10/22/2013  . Microalbuminuria 10/22/2013  . Smoking 03/21/2011  . Obese 03/21/2011  . Edema 08/18/2010  . Hyperlipidemia 03/25/2009  . Coronary atherosclerosis 03/25/2009  . Benign essential HTN 03/25/2009    Past Surgical History    Procedure Laterality Date  . Hysterectomy (other)    . Breast biopsy    . Coronary angioplasty with stent placement  2008  . Abdominal hysterectomy    . Colonoscopy with propofol N/A 09/23/2014    Procedure: COLONOSCOPY WITH PROPOFOL;  Surgeon: Lollie Sails, MD;  Location: [facility] ENDOSCOPY;  Service: Endoscopy;  Laterality: N/A;  . Esophagogastroduodenoscopy (egd) with propofol N/A 12/30/2014    Procedure: ESOPHAGOGASTRODUODENOSCOPY (EGD) WITH PROPOFOL;  Surgeon: Lollie Sails, MD;  Location: [facility] ENDOSCOPY;  Service: Endoscopy;  Laterality: N/A;    Her family history includes Breast cancer in her maternal aunt; Congestive Heart Failure in her mother; Diabetes in her mother; Heart failure in her mother; Lung cancer in her father; Stroke in her mother.    Previous Medications   ALBUTEROL (VENTOLIN HFA) 108 (90 BASE) MCG/ACT INHALER    Inhale 2 puffs into the lungs every 4 (four) hours as needed for wheezing or shortness of breath.   ASPIRIN (ASPIR-81) 81 MG EC TABLET    Take 81 mg by mouth daily.     CALCIUM CARBONATE (OS-CAL) 600 MG TABS    Take 600 mg by mouth every evening.    CHLORHEXIDINE (PERIDEX) 0.12 % SOLUTION    Use as directed 15 mLs in the mouth or throat 2 (two) times daily.   CLOPIDOGREL (PLAVIX) 75 MG TABLET    Take 1 tablet (75 mg total) by mouth daily.   COENZYME Q10 (CO Q 10) 100 MG CAPS    Take by mouth.   EZETIMIBE (ZETIA) 10 MG TABLET    Take 1 tablet (10 mg total) by mouth daily.   HYDROCODONE-ACETAMINOPHEN (NORCO/VICODIN) 5-325 MG TABLET    Take 1 tablet by mouth every 6 (six) hours as needed. To be filled after 08/19/2015   LIRAGLUTIDE (VICTOZA) 18 MG/3ML SOLN INJECTION    Inject 1.2 mg into the skin daily.    LISINOPRIL (PRINIVIL,ZESTRIL) 40 MG TABLET    Take 1 tablet (40 mg total) by mouth daily.   MECLIZINE (ANTIVERT) 25 MG TABLET    Take by mouth as needed.    METFORMIN (GLUCOPHAGE) 1000 MG TABLET    Take 1,000 mg by mouth 2 (two) times daily with a meal.    METOPROLOL TARTRATE (LOPRESSOR) 25 MG TABLET    Take 1 tablet (25 mg total) by mouth 2 (two) times daily.   MULTIPLE VITAMINS-MINERALS (DAILY MULTI) TABS    Take 1 tablet by mouth daily.    NITROGLYCERIN (NITROSTAT) 0.4 MG SL TABLET    Place 1 tablet (0.4 mg total) under the tongue every 5 (five) minutes as needed.   OMEGA-3 FATTY ACIDS (FISH OIL) 1000 MG CAPS    Take 2 capsules by mouth 2 (two) times daily. Take 4   OMEPRAZOLE (PRILOSEC) 20 MG CAPSULE    Take 20 mg by mouth 2 (two) times daily before a meal.   ONDANSETRON (ZOFRAN) 4 MG TABLET    Take 4 mg by mouth every 8 (eight) hours as needed for nausea  or vomiting.   RABEPRAZOLE (ACIPHEX) 20 MG TABLET    Take 20 mg by mouth daily.    ROSUVASTATIN (CRESTOR) 40 MG TABLET    Take 1 tablet (40 mg total) by mouth daily.   SUCRALFATE (CARAFATE) 1 G TABLET    Take 1 g by mouth 2 (two) times daily.    TIOTROPIUM (SPIRIVA) 18 MCG INHALATION CAPSULE    Place 1 capsule (18 mcg total) into inhaler and inhale daily.    Patient Care Team: Margarita Rana, MD as PCP - General (Family Medicine)     Objective:   Vitals: BP 130/80 mmHg  Pulse 84  Temp(Src) 98.4 F (36.9 C) (Oral)  Resp 20  Ht 5' 4.5" (1.638 m)  Wt 168 lb (76.204 kg)  BMI 28.40 kg/m2  SpO2 95%  Physical Exam  Constitutional: She is oriented to person, place, and time. She appears well-developed and well-nourished.  HENT:  Head: Normocephalic and atraumatic.  Right Ear: Tympanic membrane, external ear and ear canal normal.  Left Ear: Tympanic membrane, external ear and ear canal normal.  Nose: Nose normal.  Mouth/Throat: Uvula is midline, oropharynx is clear and moist and mucous membranes are normal.  Eyes: Conjunctivae, EOM and lids are normal. Pupils are equal, round, and reactive to light.  Neck: Trachea normal and normal range of motion. Neck supple. Carotid bruit is not present. No thyroid mass and no thyromegaly present.  Cardiovascular: Normal rate, regular rhythm and  normal heart sounds.   Pulmonary/Chest: Effort normal.  Decreased air movement  Abdominal: Soft. Normal appearance and bowel sounds are normal. There is no hepatosplenomegaly. There is no tenderness.  Genitourinary: No breast swelling, tenderness or discharge.  Musculoskeletal: Normal range of motion.  Lymphadenopathy:    She has no cervical adenopathy.    She has no axillary adenopathy.  Neurological: She is alert and oriented to person, place, and time. She has normal strength. No cranial nerve deficit.  Skin: Skin is warm, dry and intact.  Psychiatric: She has a normal mood and affect. Her speech is normal and behavior is normal. Judgment and thought content normal. Cognition and memory are normal.    Activities of Daily Living In your present state of health, do you have any difficulty performing the following activities: 07/27/2015 07/28/2014  Hearing? Y N  Vision? Y Y  Difficulty concentrating or making decisions? N N  Walking or climbing stairs? Y Y  Dressing or bathing? N N  Doing errands, shopping? N N    Fall Risk Assessment Fall Risk  07/27/2015 07/28/2014  Falls in the past year? No No     Depression Screen PHQ 2/9 Scores 07/27/2015 07/28/2014  PHQ - 2 Score 0 0    Cognitive Testing - 6-CIT  Correct? Score   What year is it? yes 0 0 or 4  What month is it? yes 0 0 or 3  Memorize:    Pia Mau,  43,  [place],      What time is it? (within 1 hour) yes 0 0 or 3  Count backwards from 20 no 2 0, 2, or 4  Name the months of the year yes 0 0, 2, or 4  Repeat name & address above no 0 0, 2, 4, 6, 8, or 10       TOTAL SCORE  2/28   Interpretation:  Normal  Normal (0-7) Abnormal (8-28)       Assessment & Plan:     Annual  Wellness Visit  Reviewed patient's Family Medical History Reviewed and updated list of patient's medical providers Assessment of cognitive impairment was done Assessed patient's functional ability Established a written schedule for  health screening [facility] Completed and Reviewed  Exercise Activities and Dietary recommendations Goals    . Increase water intake       Immunization History  Administered Date(s) Administered  . Influenza, High Dose Seasonal PF 11/01/2014  . Influenza-Unspecified 10/22/2013  . Pneumococcal Conjugate-13 05/22/2013  . Pneumococcal Polysaccharide-23 11/02/2011  . Zoster 04/23/2012       1. Medicare annual wellness visit, subsequent Stable. Patient advised to continue eating healthy and exercise daily. Patient will follow-up in 3 months with DR. Fisher.  2. Smoking  3. Cough New problem. Patient started on prednisone 10 mg for 6 days and azithromycin 250 mg for 5 days.  4. Chronic kidney disease, stage 3  5. Lung nodule, solitary - CT Chest Wo Contrast; Future  6. Need for hepatitis C screening test - Hepatitis C antibody  7. Chronic obstructive pulmonary disease, unspecified COPD type ([facility]) Stable. Patient advised to continue current medication and plan of care. - tiotropium (SPIRIVA) 18 MCG inhalation capsule; Place 1 capsule (18 mcg total) into inhaler and inhale daily.  Dispense: 90 capsule; Refill: 3 - predniSONE (DELTASONE) 10 MG tablet; 6 po for 1 day and then 5 po for 1 day and then 4 po for 1 day and 3 po for 1 day and then 2 po for 1 day and then 1 po for 1 day.  Dispense: 21 tablet; Refill: 0  8. Low back pain without sciatica, unspecified back pain laterality - HYDROcodone-acetaminophen (NORCO/VICODIN) 5-325 MG tablet; Take 1 tablet by mouth every 6 (six) hours as needed. To be filled after 10/19/2015  Dispense: 120 tablet; Refill: 0     Patient seen and examined by Dr. Jerrell Belfast, and note scribed by Philbert Riser. Dimas, CMA.  I have reviewed the document for accuracy and completeness and I agree with above. Jerrell Belfast, MD   Margarita Rana, MD    ------------------------------------------------------------------------------------------------------------

## 2015-07-28 ENCOUNTER — Encounter: Payer: Self-pay | Admitting: Family Medicine

## 2015-07-28 ENCOUNTER — Telehealth: Payer: Self-pay

## 2015-07-28 LAB — HEPATITIS C ANTIBODY: Hep C Virus Ab: <0.1 s/co ratio (ref 0.0–0.9)

## 2015-07-28 NOTE — Telephone Encounter (Signed)
-----   Message from Margarita Rana, MD sent at 07/28/2015  9:11 AM EDT ----- Hep C negative. Please notify patient. Thanks.

## 2015-07-28 NOTE — Telephone Encounter (Signed)
lmtcb Lenzie Montesano Drozdowski, CMA  

## 2015-07-30 ENCOUNTER — Encounter: Payer: Self-pay | Admitting: Family Medicine

## 2015-07-30 NOTE — Telephone Encounter (Signed)
Please add date for mammo to chart. Thanks.

## 2015-07-31 NOTE — Telephone Encounter (Signed)
Please update records. Thanks.

## 2015-08-03 ENCOUNTER — Ambulatory Visit

## 2015-08-04 DIAGNOSIS — N183 Chronic kidney disease, stage 3 (moderate): Secondary | ICD-10-CM | POA: Diagnosis not present

## 2015-08-04 NOTE — Telephone Encounter (Signed)
Pt advised-aa 

## 2015-08-05 ENCOUNTER — Ambulatory Visit
Admission: RE | Admit: 2015-08-05 | Discharge: 2015-08-05 | Disposition: A | Discharge status: Home / Self Care | Payer: Medicare Other | Source: Ambulatory Visit | Attending: Family Medicine | Admitting: Family Medicine

## 2015-08-05 DIAGNOSIS — R911 Solitary pulmonary nodule: Secondary | ICD-10-CM

## 2015-08-05 DIAGNOSIS — I700 Atherosclerosis of aorta: Secondary | ICD-10-CM | POA: Insufficient documentation

## 2015-08-05 DIAGNOSIS — I2510 Atherosclerotic heart disease of native coronary artery without angina pectoris: Secondary | ICD-10-CM | POA: Diagnosis not present

## 2015-08-05 DIAGNOSIS — J479 Bronchiectasis, uncomplicated: Secondary | ICD-10-CM | POA: Diagnosis not present

## 2015-08-18 ENCOUNTER — Encounter: Payer: Self-pay | Admitting: Family Medicine

## 2015-09-22 ENCOUNTER — Encounter: Payer: Self-pay | Admitting: Cardiovascular Disease

## 2015-09-29 ENCOUNTER — Encounter: Payer: Self-pay | Admitting: Family Medicine

## 2015-10-01 ENCOUNTER — Telehealth: Payer: Self-pay | Admitting: Family Medicine

## 2015-10-01 NOTE — Telephone Encounter (Signed)
Patient stated that she is still in a lot of pain. Pt scheduled appt for 3:45pm 10/02/2015.

## 2015-10-01 NOTE — Telephone Encounter (Signed)
Please contact patient to see if she is still having pain. If so then she should schedule o.v. To come in a get XR.

## 2015-10-01 NOTE — Telephone Encounter (Signed)
Pt called saying she sent a message My Chart and has not heard anything back.  Her call back is [phone] [phone]  Thanks Con Memos

## 2015-10-01 NOTE — Telephone Encounter (Signed)
Needs an appointment?-aa

## 2015-10-02 ENCOUNTER — Ambulatory Visit (INDEPENDENT_AMBULATORY_CARE_PROVIDER_SITE_OTHER): Payer: Medicare Other | Admitting: Family Medicine

## 2015-10-02 ENCOUNTER — Encounter: Payer: Self-pay | Admitting: Family Medicine

## 2015-10-02 ENCOUNTER — Ambulatory Visit
Admission: RE | Admit: 2015-10-02 | Discharge: 2015-10-02 | Disposition: A | Discharge status: Home / Self Care | Payer: Medicare Other | Source: Ambulatory Visit | Attending: Family Medicine | Admitting: Family Medicine

## 2015-10-02 VITALS — BP 120/78 | HR 68 | Temp 99.1°F | Resp 16 | Wt 166.0 lb

## 2015-10-02 DIAGNOSIS — M545 Low back pain: Secondary | ICD-10-CM

## 2015-10-02 DIAGNOSIS — I2510 Atherosclerotic heart disease of native coronary artery without angina pectoris: Secondary | ICD-10-CM

## 2015-10-02 DIAGNOSIS — M419 Scoliosis, unspecified: Secondary | ICD-10-CM | POA: Insufficient documentation

## 2015-10-02 DIAGNOSIS — M533 Sacrococcygeal disorders, not elsewhere classified: Secondary | ICD-10-CM | POA: Diagnosis not present

## 2015-10-02 DIAGNOSIS — S3992XA Unspecified injury of lower back, initial encounter: Secondary | ICD-10-CM | POA: Diagnosis not present

## 2015-10-02 MED ORDER — NAPROXEN 500 MG PO TABS: 500.0000 mg | ORAL_TABLET | Freq: Two times a day (BID) | ORAL | 0 refills | Status: AC

## 2015-10-02 NOTE — Patient Instructions (Signed)
Go to the [facility] on [address] for Xray  

## 2015-10-02 NOTE — Progress Notes (Signed)
Patient: Hannah Kirby Female    DOB: Jul 11, 1945   70 y.o.   MRN: [id number] Visit Date: 10/02/2015  Today's Provider: Lelon Huh, MD   Chief Complaint  Patient presents with  . Fall   Subjective:    On July 31st  [place] beach, patient fainted.  EMTs determined she was overheated and dehydrated.  About 3 hrs. later her tail bone started hurting very badly to the point she could not sit flat. She has to sit on one hip or lie down.  It does not hurt walking just when she puts pressure on it by sitting.  There is also pain on right hip running into front right side.     Fall  The accident occurred more than 1 week ago (1 1/2 weeks ago). The fall occurred while walking (on sand at the beach). She fell from a height of 3 to 5 ft. Impact surface: sand. There was no blood loss. The point of impact was the buttocks. The pain is present in the buttocks. The pain is at a severity of 8/10. The pain is severe. The symptoms are aggravated by sitting. Pertinent negatives include no abdominal pain, bowel incontinence, fever, headaches, hearing loss, hematuria, loss of consciousness, nausea, numbness, tingling, visual change or vomiting. She has tried nothing for the symptoms. The treatment provided no relief.       Allergies  Allergen Reactions  . Sulfonamide Derivatives Other (See Comments)    Last taken as a child; made her mouth break out  . Codeine Nausea Only  . Prednisone Palpitations and Other (See Comments)    Made her legs "feel weird."   Current Meds  Medication Sig  . albuterol (VENTOLIN HFA) 108 (90 BASE) MCG/ACT inhaler Inhale 2 puffs into the lungs every 4 (four) hours as needed for wheezing or shortness of breath.  Marland Kitchen aspirin (ASPIR-81) 81 MG EC tablet Take 81 mg by mouth daily.    . calcium carbonate (OS-CAL) 600 MG TABS Take 600 mg by mouth every evening.   . chlorhexidine (PERIDEX) 0.12 % solution Use as directed 15 mLs in the mouth or throat 2 (two) times  daily. (Patient taking differently: Use as directed 15 mLs in the mouth or throat as needed. )  . clopidogrel (PLAVIX) 75 MG tablet Take 1 tablet (75 mg total) by mouth daily.  . Coenzyme Q10 (CO Q 10) 100 MG CAPS Take by mouth.  . ezetimibe (ZETIA) 10 MG tablet Take 1 tablet (10 mg total) by mouth daily.  Marland Kitchen HYDROcodone-acetaminophen (NORCO/VICODIN) 5-325 MG tablet Take 1 tablet by mouth every 6 (six) hours as needed. To be filled after 10/19/2015  . Liraglutide (VICTOZA) 18 MG/3ML SOLN injection Inject 1.2 mg into the skin daily.   Marland Kitchen lisinopril (PRINIVIL,ZESTRIL) 40 MG tablet Take 1 tablet (40 mg total) by mouth daily.  . meclizine (ANTIVERT) 25 MG tablet Take by mouth as needed.   . metFORMIN (GLUCOPHAGE) 1000 MG tablet Take 1,000 mg by mouth 2 (two) times daily with a meal.  . metoprolol tartrate (LOPRESSOR) 25 MG tablet Take 1 tablet (25 mg total) by mouth 2 (two) times daily.  . Multiple Vitamins-Minerals (DAILY MULTI) TABS Take 1 tablet by mouth daily.   . nitroGLYCERIN (NITROSTAT) 0.4 MG SL tablet Place 1 tablet (0.4 mg total) under the tongue every 5 (five) minutes as needed.  . Omega-3 Fatty Acids (FISH OIL) 1000 MG CAPS Take 2 capsules by mouth 2 (two)  times daily. Take 4  . omeprazole (PRILOSEC) 20 MG capsule Take 20 mg by mouth 2 (two) times daily before a meal.  . ondansetron (ZOFRAN) 4 MG tablet Take 4 mg by mouth every 8 (eight) hours as needed for nausea or vomiting.  . RABEprazole (ACIPHEX) 20 MG tablet Take 20 mg by mouth daily.   . rosuvastatin (CRESTOR) 40 MG tablet Take 1 tablet (40 mg total) by mouth daily.  . sucralfate (CARAFATE) 1 g tablet Take 1 g by mouth 2 (two) times daily.   Marland Kitchen tiotropium (SPIRIVA) 18 MCG inhalation capsule Place 1 capsule (18 mcg total) into inhaler and inhale daily.  . [DISCONTINUED] azithromycin (ZITHROMAX) 250 MG tablet Take 2 tablets on day one and 1 tablet daily    Review of Systems  Constitutional: Negative for fever.  Gastrointestinal:  Negative for abdominal pain, bowel incontinence, nausea and vomiting.  Genitourinary: Negative for hematuria.  Neurological: Negative for tingling, loss of consciousness, numbness and headaches.    Social History  Substance Use Topics  . Smoking status: Current Every Day Smoker    Packs/day: 1.00    Years: 40.00    Types: Cigarettes  . Smokeless tobacco: Never Used  . Alcohol use Yes     Comment: occasional   Objective:   BP 120/78 (BP Location: Right Arm, Patient Position: Sitting, Cuff Size: Normal)   Pulse 68   Temp 99.1 F (37.3 C) (Oral)   Resp 16   Wt 166 lb (75.3 kg)   BMI 28.05 kg/m   Physical Exam  General appearance: alert, well developed, well nourished, cooperative and in no distress Head: Normocephalic, without obvious abnormality, atraumatic Respiratory: Respirations even and unlabored, normal respiratory rate MS: Moderate tenderness over lower lumbar spine and coccyx. No bruising noted. Slight swelling.      Assessment & Plan:     1. Low back pain without sciatica, unspecified back pain laterality Frequent application of ice and heat. Rule out fracture. Try OTC Lidocaine patches.  - DG Lumbar Spine Complete; Future - DG Sacrum/Coccyx; Future     The entirety of the information documented in the History of Present Illness, Review of Systems and Physical Exam were personally obtained by me. Portions of this information were initially documented by April M. [name] Heck, CMA and reviewed by me for thoroughness and accuracy.    Lelon Huh, MD  [facility]

## 2015-10-06 NOTE — Telephone Encounter (Signed)
Patient advised as below.  

## 2015-10-06 NOTE — Telephone Encounter (Signed)
-----   Message from Birdie Sons, MD sent at 10/03/2015  8:11 AM EDT ----- No fractures on xrays, pain is probably due to bruising or stain and should resolved within 2-3 weeks.

## 2015-10-08 DIAGNOSIS — N183 Chronic kidney disease, stage 3 (moderate): Secondary | ICD-10-CM | POA: Diagnosis not present

## 2015-10-08 DIAGNOSIS — E1122 Type 2 diabetes mellitus with diabetic chronic kidney disease: Secondary | ICD-10-CM | POA: Diagnosis not present

## 2015-10-15 DIAGNOSIS — N184 Chronic kidney disease, stage 4 (severe): Secondary | ICD-10-CM | POA: Diagnosis not present

## 2015-10-15 DIAGNOSIS — E1122 Type 2 diabetes mellitus with diabetic chronic kidney disease: Secondary | ICD-10-CM | POA: Diagnosis not present

## 2015-10-15 DIAGNOSIS — E1165 Type 2 diabetes mellitus with hyperglycemia: Secondary | ICD-10-CM | POA: Diagnosis not present

## 2015-10-15 DIAGNOSIS — E785 Hyperlipidemia, unspecified: Secondary | ICD-10-CM | POA: Diagnosis not present

## 2015-10-15 DIAGNOSIS — E781 Pure hyperglyceridemia: Secondary | ICD-10-CM | POA: Diagnosis not present

## 2015-10-15 DIAGNOSIS — Z8639 Personal history of other endocrine, nutritional and metabolic disease: Secondary | ICD-10-CM | POA: Diagnosis not present

## 2015-10-29 DIAGNOSIS — N184 Chronic kidney disease, stage 4 (severe): Secondary | ICD-10-CM | POA: Diagnosis not present

## 2015-10-29 DIAGNOSIS — E1122 Type 2 diabetes mellitus with diabetic chronic kidney disease: Secondary | ICD-10-CM | POA: Diagnosis not present

## 2015-11-03 DIAGNOSIS — E785 Hyperlipidemia, unspecified: Secondary | ICD-10-CM | POA: Diagnosis not present

## 2015-11-03 DIAGNOSIS — N183 Chronic kidney disease, stage 3 (moderate): Secondary | ICD-10-CM | POA: Diagnosis not present

## 2015-11-03 DIAGNOSIS — E781 Pure hyperglyceridemia: Secondary | ICD-10-CM | POA: Diagnosis not present

## 2015-11-03 DIAGNOSIS — Z8639 Personal history of other endocrine, nutritional and metabolic disease: Secondary | ICD-10-CM | POA: Diagnosis not present

## 2015-11-03 DIAGNOSIS — E1165 Type 2 diabetes mellitus with hyperglycemia: Secondary | ICD-10-CM | POA: Diagnosis not present

## 2015-11-03 DIAGNOSIS — E1122 Type 2 diabetes mellitus with diabetic chronic kidney disease: Secondary | ICD-10-CM | POA: Diagnosis not present

## 2015-11-07 DIAGNOSIS — Z79899 Other long term (current) drug therapy: Secondary | ICD-10-CM | POA: Diagnosis not present

## 2015-11-07 DIAGNOSIS — K6289 Other specified diseases of anus and rectum: Secondary | ICD-10-CM | POA: Diagnosis not present

## 2015-11-07 DIAGNOSIS — F17200 Nicotine dependence, unspecified, uncomplicated: Secondary | ICD-10-CM | POA: Diagnosis not present

## 2015-11-07 DIAGNOSIS — R197 Diarrhea, unspecified: Secondary | ICD-10-CM | POA: Diagnosis not present

## 2015-11-07 DIAGNOSIS — R1084 Generalized abdominal pain: Secondary | ICD-10-CM | POA: Diagnosis not present

## 2015-11-07 DIAGNOSIS — I10 Essential (primary) hypertension: Secondary | ICD-10-CM | POA: Diagnosis not present

## 2015-11-07 DIAGNOSIS — J449 Chronic obstructive pulmonary disease, unspecified: Secondary | ICD-10-CM | POA: Diagnosis not present

## 2015-11-07 DIAGNOSIS — Z7984 Long term (current) use of oral hypoglycemic drugs: Secondary | ICD-10-CM | POA: Diagnosis not present

## 2015-11-07 DIAGNOSIS — E1140 Type 2 diabetes mellitus with diabetic neuropathy, unspecified: Secondary | ICD-10-CM | POA: Diagnosis not present

## 2015-11-16 ENCOUNTER — Ambulatory Visit: Payer: Medicare Other | Admitting: Family Medicine

## 2015-11-19 ENCOUNTER — Encounter: Payer: Self-pay | Admitting: Cardiovascular Disease

## 2015-11-19 ENCOUNTER — Ambulatory Visit (INDEPENDENT_AMBULATORY_CARE_PROVIDER_SITE_OTHER): Payer: Medicare Other | Admitting: Cardiovascular Disease

## 2015-11-19 VITALS — BP 136/80 | HR 75 | Ht 66.0 in | Wt 161.0 lb

## 2015-11-19 DIAGNOSIS — I2510 Atherosclerotic heart disease of native coronary artery without angina pectoris: Secondary | ICD-10-CM

## 2015-11-19 DIAGNOSIS — I739 Peripheral vascular disease, unspecified: Secondary | ICD-10-CM

## 2015-11-19 DIAGNOSIS — F17200 Nicotine dependence, unspecified, uncomplicated: Secondary | ICD-10-CM

## 2015-11-19 DIAGNOSIS — E0821 Diabetes mellitus due to underlying condition with diabetic nephropathy: Secondary | ICD-10-CM

## 2015-11-19 DIAGNOSIS — I10 Essential (primary) hypertension: Secondary | ICD-10-CM | POA: Diagnosis not present

## 2015-11-19 DIAGNOSIS — N183 Chronic kidney disease, stage 3 unspecified: Secondary | ICD-10-CM

## 2015-11-19 DIAGNOSIS — E785 Hyperlipidemia, unspecified: Secondary | ICD-10-CM

## 2015-11-19 DIAGNOSIS — Z720 Tobacco use: Secondary | ICD-10-CM | POA: Diagnosis not present

## 2015-11-19 MED ORDER — CLOPIDOGREL BISULFATE 75 MG PO TABS: 75.0000 mg | ORAL_TABLET | Freq: Every day | ORAL | 3 refills | Status: DC

## 2015-11-19 MED ORDER — METOPROLOL TARTRATE 25 MG PO TABS: 25.0000 mg | ORAL_TABLET | Freq: Two times a day (BID) | ORAL | 3 refills | Status: DC

## 2015-11-19 MED ORDER — LISINOPRIL 40 MG PO TABS: 40.0000 mg | ORAL_TABLET | Freq: Every day | ORAL | 3 refills | Status: DC

## 2015-11-19 MED ORDER — ROSUVASTATIN CALCIUM 40 MG PO TABS: 40.0000 mg | ORAL_TABLET | Freq: Every day | ORAL | 3 refills | Status: DC

## 2015-11-19 NOTE — Progress Notes (Signed)
Cardiology Office Note  Date:  11/19/2015   ID:  Hannah Kirby, DOB 06-11-1945, MRN [id number]  PCP:  Lelon Huh, MD   Chief Complaint  Patient presents with  . other    6 month f/u c/o bluish feet see PAD questionaire. Meds reviewed verbally with pt.    HPI:  Hannah Kirby is a  70 year-old woman with a history of diabetes, obesity, significantly improved with recent weight loss on  Victoza, coronary artery disease with stenting of her RCA  in January 2008, long smoking history who continues to smoke, hyperlipidemia who presents for routine followup of her coronary artery disease. outbreak of her granulomatous disease in summary 2013,  prednisone for this condition.  History pinched nerve and is on pain medication.  In follow-up she reports that her sugars have been running high Recent lab work showing HBA1C 8.1 Went up on [place], helping with further weight loss She is concerned about worsening renal function Creatinine 1.5, BUN 29, previous creatinine 1.8  Tolerating crestor Having tooth problems, needs to have teeth pulled  Reports having episode of Dehydration after diarrhea mid September 2017 Starting having rectal bleeding Stopped her Plavix for several days with improvement of her symptoms  Denies having any significant chest pain on exertion History of GERD and Barrett's esophagus per the patient.  Takes omeprazole  Continues to smoke  good exercise tolerance with no reent producible chest pain symptoms  Chronic back pain   EKG on today's visit shows normal sinus rhythm with rate 75 bpm, no significant ST abnormality, T wave abdomen mildly 1 and aVL  Other past medical history  stress test for chest pain 07/29/2014 that showed no ischemia  chronicproblems with her teeth   working with Dr. Eddie Dibbles on her diabetes.   PMH:   has a past medical history of Anemia; Arthritis; CAD (coronary artery disease); Chronic bronchitis ([place]); COPD (chronic obstructive  pulmonary disease) ([place]); Diabetes mellitus; FHx: allergies; GERD (gastroesophageal reflux disease); Goiter; Granulomatous disease ([place]); Hernia; Hyperlipidemia; Hypertension; Kidney stone on left side (2013); Microalbuminuria; Obesity; and Spinal stenosis.  PSH:    Past Surgical History:  Procedure Laterality Date  . ABDOMINAL HYSTERECTOMY    . BREAST BIOPSY    . COLONOSCOPY WITH PROPOFOL N/A 09/23/2014   Procedure: COLONOSCOPY WITH PROPOFOL;  Surgeon: Lollie Sails, MD;  Location: [facility] ENDOSCOPY;  Service: Endoscopy;  Laterality: N/A;  . CORONARY ANGIOPLASTY WITH STENT PLACEMENT  2008  . ESOPHAGOGASTRODUODENOSCOPY (EGD) WITH PROPOFOL N/A 12/30/2014   Procedure: ESOPHAGOGASTRODUODENOSCOPY (EGD) WITH PROPOFOL;  Surgeon: Lollie Sails, MD;  Location: [facility] END[facility]COPY;  Service: Endoscopy;  Laterality: N/A;  . hysterectomy (other)      Current Outpatient Prescriptions  Medication Sig Dispense Refill  . albuterol (VENTOLIN HFA) 108 (90 BASE) MCG/ACT inhaler Inhale 2 puffs into the lungs every 4 (four) hours as needed for wheezing or shortness of breath. 48 g 3  . aspirin (ASPIR-81) 81 MG EC tablet Take 81 mg by mouth daily.      . chlorhexidine (PERIDEX) 0.12 % solution Use as directed 15 mLs in the mouth or throat 2 (two) times daily. (Patient taking differently: Use as directed 15 mLs in the mouth or throat as needed. ) 120 mL 6  . clopidogrel (PLAVIX) 75 MG tablet Take 1 tablet (75 mg total) by mouth daily. 90 tablet 3  . Coenzyme Q10 (CO Q 10) 100 MG CAPS Take by mouth daily.     Marland Kitchen ezetimibe (ZETIA) 10 MG  tablet Take 1 tablet (10 mg total) by mouth daily. 90 tablet 4  . HYDROcodone-acetaminophen (NORCO/VICODIN) 5-325 MG tablet Take 1 tablet by mouth every 6 (six) hours as needed. To be filled after 10/19/2015 120 tablet 0  . Liraglutide (VICTOZA) 18 MG/3ML SOLN injection Inject 1.2 mg into the skin daily.     Marland Kitchen lisinopril (PRINIVIL,ZESTRIL) 40 MG tablet Take 1 tablet (40 mg total) by  mouth daily. 90 tablet 3  . meclizine (ANTIVERT) 25 MG tablet Take by mouth as needed.     . metFORMIN (GLUCOPHAGE) 1000 MG tablet Take 1,000 mg by mouth 2 (two) times daily with a meal.    . metoprolol tartrate (LOPRESSOR) 25 MG tablet Take 1 tablet (25 mg total) by mouth 2 (two) times daily. 180 tablet 3  . Multiple Vitamins-Minerals (DAILY MULTI) TABS Take 1 tablet by mouth daily.     . nitroGLYCERIN (NITROSTAT) 0.4 MG SL tablet Place 1 tablet (0.4 mg total) under the tongue every 5 (five) minutes as needed. 25 tablet 6  . Omega-3 Fatty Acids (FISH OIL) 1000 MG CAPS Take 2 capsules by mouth 2 (two) times daily. Take 4    . omeprazole (PRILOSEC) 20 MG capsule Take 20 mg by mouth as needed.     . ondansetron (ZOFRAN) 4 MG tablet Take 4 mg by mouth every 8 (eight) hours as needed for nausea or vomiting.    . RABEprazole (ACIPHEX) 20 MG tablet Take 20 mg by mouth daily.     . rosuvastatin (CRESTOR) 40 MG tablet Take 1 tablet (40 mg total) by mouth daily. 90 tablet 3  . sucralfate (CARAFATE) 1 g tablet Take 1 g by mouth 2 (two) times daily.     Marland Kitchen tiotropium (SPIRIVA) 18 MCG inhalation capsule Place 1 capsule (18 mcg total) into inhaler and inhale daily. 90 capsule 3   No current facility-administered medications for this visit.      Allergies:   Sulfonamide derivatives; Codeine; and Prednisone   Social History:  The patient  reports that she has been smoking Cigarettes.  She has a 40.00 pack-year smoking history. She has never used smokeless tobacco. She reports that she drinks alcohol. She reports that she does not use drugs.   Family History:   family history includes Breast cancer in her maternal aunt; Congestive Heart Failure in her mother; Diabetes in her mother; Heart failure in her mother; Lung cancer in her father; Stroke in her mother.    Review of Systems: Review of Systems  Constitutional: Negative.   Respiratory: Negative.   Cardiovascular: Negative.   Gastrointestinal:  Negative.   Musculoskeletal: Negative.   Neurological: Negative.   Psychiatric/Behavioral: Negative.   All other systems reviewed and are negative.    PHYSICAL EXAM: VS:  BP 136/80 (BP Location: Left Arm, Patient Position: Sitting, Cuff Size: Normal)   Pulse 75   Ht 5\' 6"  (1.676 m)   Wt 161 lb (73 kg)   BMI 25.99 kg/m  , BMI Body mass index is 25.99 kg/m. GEN: Well nourished, well developed, in no acute distress  HEENT: normal  Neck: no JVD, carotid bruits, or masses Cardiac: RRR; no murmurs, rubs, or gallops,no edema , lower extremity pulses are moderately diminished, toes and feet mottled Respiratory:  clear to auscultation bilaterally, normal work of breathing GI: soft, nontender, nondistended, + BS MS: no deformity or atrophy  Skin: warm and dry, no rash Neuro:  Strength and sensation are intact Psych: euthymic mood, full affect  Recent Labs: 06/02/2015: Hemoglobin 13.4; Platelets 276 06/26/2015: BUN 33; Creatinine 1.7; Potassium 4.1; Sodium 141    Lipid Panel No results found for: CHOL, HDL, LDLCALC, TRIG    Wt Readings from Last 3 Encounters:  11/19/15 161 lb (73 kg)  10/02/15 166 lb (75.3 kg)  07/27/15 168 lb (76.2 kg)       ASSESSMENT AND PLAN:  Coronary artery disease involving native coronary artery of native heart without angina pectoris - Plan: EKG 12-Lead, LE ARTERIAL, VAS [place] ABI WITH/WO TBI Currently with no symptoms of angina. No further workup at this time. Continue current medication regimen.  Benign essential HTN - Plan: EKG 12-Lead, LE ARTERIAL, VAS [place] ABI WITH/WO TBI Blood pressure is well controlled on today's visit. No changes made to the medications.  Claudication ([place]) - Plan: LE ARTERIAL, VAS [place] ABI WITH/WO TBI She reports having mottled appearing feet, cramping at nighttime We have recommended lower extremity arterial Doppler   Smoking Literature provided on smoking cessation She does not want Chantix  Hyperlipidemia Cholesterol  above goal, likely from worsening diabetes control Stressed importance of weight loss, low hemoglobin A1c  Chronic kidney disease, stage 3 Stressed importance of aggressive diabetes control  Diabetes mellitus due to underlying condition with diabetic nephropathy, unspecified long term insulin use status ([place]) We have encouraged continued exercise, careful diet management in an effort to lose weight. Victoza recently increased, mild weight loss  Disposition:   F/U  6 months   Total encounter time more than 25 minutes  Greater than 50% was spent in counseling and coordination of care with the patient    Orders Placed This Encounter  Procedures  . EKG 12-Lead     Signed, Esmond Plants, M.D., Ph.D. 11/19/2015  [place], [place]

## 2015-11-19 NOTE — Patient Instructions (Addendum)
Medication Instructions:   No medication changes made  Labwork:  No new labs needed  Testing/Procedures:  We will put in an order for ABI, Lower extremity arterial u/s   Follow-Up: It was a pleasure seeing you in the office today. Please call us if you have new issues that need to be addressed before your next appt.  [phone]  Your physician wants you to follow-up in: 6 months.  You will receive a reminder letter in the mail two months in advance. If you don't receive a letter, please call our office to schedule the follow-up appointment.  If you need a refill on your cardiac medications before your next appointment, please call your pharmacy.     Steps to Quit Smoking  Smoking tobacco can be harmful to your health and can affect almost every organ in your body. Smoking puts you, and those around you, at risk for developing many serious chronic diseases. Quitting smoking is difficult, but it is one of the best things that you can do for your health. It is never too late to quit. WHAT ARE THE BENEFITS OF QUITTING SMOKING? When you quit smoking, you lower your risk of developing serious diseases and conditions, such as:  Lung cancer or lung disease, such as COPD.  Heart disease.  Stroke.  Heart attack.  Infertility.  Osteoporosis and bone fractures. Additionally, symptoms such as coughing, wheezing, and shortness of breath may get better when you quit. You may also find that you get sick less often because your body is stronger at fighting off colds and infections. If you are pregnant, quitting smoking can help to reduce your chances of having a baby of low birth weight. HOW DO I GET READY TO QUIT? When you decide to quit smoking, create a plan to make sure that you are successful. Before you quit:  Pick a date to quit. Set a date within the next two weeks to give you time to prepare.  Write down the reasons why you are quitting. Keep this list in places where you  will see it often, such as on your bathroom mirror or in your car or wallet.  Identify the people, places, things, and activities that make you want to smoke (triggers) and avoid them. Make sure to take these actions:  Throw away all cigarettes at home, at work, and in your car.  Throw away smoking accessories, such as Scientist, research (medical).  Clean your car and make sure to empty the ashtray.  Clean your home, including curtains and carpets.  Tell your family, friends, and coworkers that you are quitting. Support from your loved ones can make quitting easier.  Talk with your health care provider about your options for quitting smoking.  Find out what treatment options are covered by your health insurance. WHAT STRATEGIES CAN I USE TO QUIT SMOKING?  Talk with your healthcare provider about different strategies to quit smoking. Some strategies include:  Quitting smoking altogether instead of gradually lessening how much you smoke over a period of time. Research shows that quitting "cold [place]" is more successful than gradually quitting.  Attending in-person counseling to help you build problem-solving skills. You are more likely to have success in quitting if you attend several counseling sessions. Even short sessions of 10 minutes can be effective.  Finding resources and support systems that can help you to quit smoking and remain smoke-free after you quit. These resources are most helpful when you use them often. They can include:  Online  chats with a Social worker.  Telephone quitlines.  Printed Furniture conservator/restorer.  Support groups or group counseling.  Text messaging programs.  Mobile phone applications.  Taking medicines to help you quit smoking. (If you are pregnant or breastfeeding, talk with your health care provider first.) Some medicines contain nicotine and some do not. Both types of medicines help with cravings, but the medicines that include nicotine help to relieve  withdrawal symptoms. Your health care provider may recommend:  Nicotine patches, gum, or lozenges.  Nicotine inhalers or sprays.  Non-nicotine medicine that is taken by mouth. Talk with your health care provider about combining strategies, such as taking medicines while you are also receiving in-person counseling. Using these two strategies together makes you more likely to succeed in quitting than if you used either strategy on its own. If you are pregnant or breastfeeding, talk with your health care provider about finding counseling or other support strategies to quit smoking. Do not take medicine to help you quit smoking unless told to do so by your health care provider. WHAT THINGS CAN I DO TO MAKE IT EASIER TO QUIT? Quitting smoking might feel overwhelming at first, but there is a lot that you can do to make it easier. Take these important actions:  Reach out to your family and friends and ask that they support and encourage you during this time. Call telephone quitlines, reach out to support groups, or work with a counselor for support.  Ask people who smoke to avoid smoking around you.  Avoid places that trigger you to smoke, such as bars, parties, or smoke-break areas at work.  Spend time around people who do not smoke.  Lessen stress in your life, because stress can be a smoking trigger for some people. To lessen stress, try:  Exercising regularly.  Deep-breathing exercises.  Yoga.  Meditating.  Performing a body scan. This involves closing your eyes, scanning your body from head to toe, and noticing which parts of your body are particularly tense. Purposefully relax the muscles in those areas.  Download or purchase mobile phone or tablet apps (applications) that can help you stick to your quit plan by providing reminders, tips, and encouragement. There are many free apps, such as QuitGuide from the [employer] Office manager for Disease Control and Prevention). You can find other support  for quitting smoking (smoking cessation) through smokefree.gov and other websites. HOW WILL I FEEL WHEN I QUIT SMOKING? Within the first 24 hours of quitting smoking, you may start to feel some withdrawal symptoms. These symptoms are usually most noticeable 2-3 days after quitting, but they usually do not last beyond 2-3 weeks. Changes or symptoms that you might experience include:  Mood swings.  Restlessness, anxiety, or irritation.  Difficulty concentrating.  Dizziness.  Strong cravings for sugary foods in addition to nicotine.  Mild weight gain.  Constipation.  Nausea.  Coughing or a sore throat.  Changes in how your medicines work in your body.  A depressed mood.  Difficulty sleeping (insomnia). After the first 2-3 weeks of quitting, you may start to notice more positive results, such as:  Improved sense of smell and taste.  Decreased coughing and sore throat.  Slower heart rate.  Lower blood pressure.  Clearer skin.  The ability to breathe more easily.  Fewer sick days. Quitting smoking is very challenging for most people. Do not get discouraged if you are not successful the first time. Some people need to make many attempts to quit before they achieve long-term success.  Do your best to stick to your quit plan, and talk with your health care provider if you have any questions or concerns.   This information is not intended to replace advice given to you by your health care provider. Make sure you discuss any questions you have with your health care provider.   Document Released: 02/01/2001 Document Revised: 06/24/2014 Document Reviewed: 06/24/2014 Elsevier Interactive Patient Education 2016 [employer]. Smoking Hazards Smoking cigarettes is extremely bad for your health. Tobacco smoke has over 200 known poisons in it. It contains the poisonous gases nitrogen oxide and carbon monoxide. There are over 60 chemicals in tobacco smoke that cause cancer. Some of the  chemicals found in cigarette smoke include:   Cyanide.   Benzene.   Formaldehyde.   Methanol (wood alcohol).   Acetylene (fuel used in welding torches).   Ammonia.  Even smoking lightly shortens your life expectancy by several years. You can greatly reduce the risk of medical problems for you and your family by stopping now. Smoking is the most preventable cause of death and disease in our society. Within days of quitting smoking, your circulation improves, you decrease the risk of having a heart attack, and your lung capacity improves. There may be some increased phlegm in the first few days after quitting, and it may take months for your lungs to clear up completely. Quitting for 10 years reduces your risk of developing lung cancer to almost that of a nonsmoker.  WHAT ARE THE RISKS OF SMOKING? Cigarette smokers have an increased risk of many serious medical problems, including:  Lung cancer.   Lung disease (such as pneumonia, bronchitis, and emphysema).   Heart attack and chest pain due to the heart not getting enough oxygen (angina).   Heart disease and peripheral blood vessel disease.   Hypertension.   Stroke.   Oral cancer (cancer of the lip, mouth, or voice box).   Bladder cancer.   Pancreatic cancer.   Cervical cancer.   Pregnancy complications, including premature birth.   Stillbirths and smaller newborn babies, birth defects, and genetic damage to sperm.   Early menopause.   Lower estrogen level for women.   Infertility.   Facial wrinkles.   Blindness.   Increased risk of broken bones (fractures).   Senile dementia.   Stomach ulcers and internal bleeding.   Delayed wound healing and increased risk of complications during surgery. Because of secondhand smoke exposure, children of smokers have an increased risk of the following:   Sudden infant death syndrome (SIDS).   Respiratory infections.   Lung cancer.   Heart  disease.   Ear infections.  WHY IS SMOKING ADDICTIVE? Nicotine is the chemical agent in tobacco that is capable of causing addiction or dependence. When you smoke and inhale, nicotine is absorbed rapidly into the bloodstream through your lungs. Both inhaled and noninhaled nicotine may be addictive.  WHAT ARE THE BENEFITS OF QUITTING?  There are many health benefits to quitting smoking. Some are:   The likelihood of developing cancer and heart disease decreases. Health improvements are seen almost immediately.   Blood pressure, pulse rate, and breathing patterns start returning to normal soon after quitting.   People who quit may see an improvement in their overall quality of life.  HOW DO YOU QUIT SMOKING? Smoking is an addiction with both physical and psychological effects, and longtime habits can be hard to change. Your health care provider can recommend:  Programs and community resources, which may include group support,  education, or therapy.  Replacement products, such as patches, gum, and nasal sprays. Use these products only as directed. Do not replace cigarette smoking with electronic cigarettes (commonly called e-cigarettes). The safety of e-cigarettes is unknown, and some may contain harmful chemicals. FOR MORE INFORMATION  American Lung Association: www.lung.org  American Cancer Society: www.cancer.org   This information is not intended to replace advice given to you by your health care provider. Make sure you discuss any questions you have with your health care provider.   Document Released: 03/17/2004 Document Revised: 11/28/2012 Document Reviewed: 07/30/2012 Elsevier Interactive Patient Education 2016 [place] WHAT IS SECONDHAND SMOKE? Secondhand smoke is smoke that comes from burning tobacco. It could be the smoke from a cigarette, a pipe, or a cigar. Even if you are not the one smoking, secondhand smoke exposes you to the dangers of smoking.  This is called involuntary, or passive, smoking. There are two types of secondhand smoke:  Sidestream smoke is the smoke that comes off the lighted end of a cigarette, pipe, or cigar.  This type of smoke has the highest amount of cancer-causing agents (carcinogens).  The particles in sidestream smoke are smaller. They get into your lungs more easily.  Mainstream smoke is the smoke that is exhaled by a person who is smoking.  This type of smoke is also dangerous to your health. HOW CAN SECONDHAND SMOKE AFFECT MY HEALTH? Studies show that there is no safe level of secondhand smoke. This smoke contains thousands of chemicals. At least 62 of them are known to cause cancer. Secondhand smoke can also cause many other health problems. It has been linked to:  Lung cancer.  Cancer of the voice box (larynx) or throat.  Cancer of the sinuses.  Brain cancer.  Bladder cancer.  Stomach cancer.  Breast cancer.  White blood cell cancers (lymphoma and leukemia).  Brain and liver tumors in children.  Heart disease and stroke in adults.  Pregnancy loss (miscarriage).  Diseases in children, such as:  Asthma.  Lung infections.  Ear infections.  Sudden infant death syndrome (SIDS).  Slow growth. WHERE CAN I BE AT RISK FOR EXPOSURE TO SECONDHAND SMOKE?   For adults, the workplace is the main source of exposure to secondhand smoke.  Your workplace should have a policy separating smoking areas from nonsmoking areas.  Smoking areas should have a system for ventilating and cleaning the air.  For children, the home may be the most dangerous place for exposure to secondhand smoke.  Children who live in apartment buildings may be at risk from smoke drifting from hallways or other people's homes.  For everyone, many public places are possible sources of exposure to secondhand smoke.  These places include restaurants, shopping centers, and parks. HOW CAN I REDUCE MY RISK FOR EXPOSURE  TO SECONDHAND SMOKE? The most important thing you can do is not smoke. Discourage family members from smoking. Other ways to reduce exposure for you and your family include the following:  Keep your home smoke free.  Make sure your child care providers do not smoke.  Warn your child about the dangers of smoking and secondhand smoke.  Do not allow smoking in your car. When someone smokes in a car, all the damaging chemicals from the smoke are confined in a small area.  Avoid public places where smoking is allowed.   This information is not intended to replace advice given to you by your health care provider. Make sure you discuss any questions  you have with your health care provider.   Document Released: 03/17/2004 Document Revised: 02/28/2014 Document Reviewed: 05/24/2013 Elsevier Interactive Patient Education [employer].

## 2015-11-24 ENCOUNTER — Encounter: Payer: Self-pay | Admitting: Family Medicine

## 2015-11-24 ENCOUNTER — Ambulatory Visit (INDEPENDENT_AMBULATORY_CARE_PROVIDER_SITE_OTHER): Payer: Medicare Other | Admitting: Family Medicine

## 2015-11-24 VITALS — BP 126/78 | HR 80 | Temp 98.1°F | Resp 20 | Wt 163.0 lb

## 2015-11-24 DIAGNOSIS — E0821 Diabetes mellitus due to underlying condition with diabetic nephropathy: Secondary | ICD-10-CM

## 2015-11-24 DIAGNOSIS — J449 Chronic obstructive pulmonary disease, unspecified: Secondary | ICD-10-CM | POA: Diagnosis not present

## 2015-11-24 DIAGNOSIS — Z23 Encounter for immunization: Secondary | ICD-10-CM | POA: Diagnosis not present

## 2015-11-24 DIAGNOSIS — I2510 Atherosclerotic heart disease of native coronary artery without angina pectoris: Secondary | ICD-10-CM

## 2015-11-24 DIAGNOSIS — I10 Essential (primary) hypertension: Secondary | ICD-10-CM | POA: Diagnosis not present

## 2015-11-24 MED ORDER — HYDROCODONE-ACETAMINOPHEN 5-325 MG PO TABS: 1.0000 | ORAL_TABLET | Freq: Four times a day (QID) | ORAL | 0 refills | Status: DC | PRN

## 2015-11-24 MED ORDER — ALBUTEROL SULFATE HFA 108 (90 BASE) MCG/ACT IN AERS: 2.0000 | INHALATION_SPRAY | RESPIRATORY_TRACT | 3 refills | Status: DC | PRN

## 2015-11-24 NOTE — Progress Notes (Signed)
Patient: Hannah Kirby Female    DOB: 1945-11-16   70 y.o.   MRN: [id number] Visit Date: 11/24/2015  Today's Provider: Lelon Huh, MD   Chief Complaint  Patient presents with  . Back Pain  . COPD   Subjective:    HPI  Follow up for chronic back pain  The patient was last seen for this 3 months ago. Changes made at last visit include no changes.  She reports excellent compliance with treatment. Patient reports she is taking Hydrocodone-acetaminophen  1 tablet 3 times daily. She feels that condition is Unchanged. She is not having side effects.   ------------------------------------------------------------------------------------   Follow up for COPD  The patient was last seen for this 3 months ago. Changes made at last visit include no changes.  She reports excellent compliance with treatment. Patient reports she is having to use Albuterol 2 to 3 times daily. Patient reports she uses Spiriva daily. She feels that condition is Unchanged. She is not having side effects.   ------------------------------------------------------------------------------------      Allergies  Allergen Reactions  . Sulfonamide Derivatives Other (See Comments)    Last taken as a child; made her mouth break out  . Codeine Nausea Only  . Prednisone Palpitations and Other (See Comments)    Made her legs "feel weird."     Current Outpatient Prescriptions:  .  albuterol (VENTOLIN HFA) 108 (90 BASE) MCG/ACT inhaler, Inhale 2 puffs into the lungs every 4 (four) hours as needed for wheezing or shortness of breath., Disp: 48 g, Rfl: 3 .  aspirin (ASPIR-81) 81 MG EC tablet, Take 81 mg by mouth daily.  , Disp: , Rfl:  .  chlorhexidine (PERIDEX) 0.12 % solution, Use as directed 15 mLs in the mouth or throat 2 (two) times daily. (Patient taking differently: Use as directed 15 mLs in the mouth or throat as needed. ), Disp: 120 mL, Rfl: 6 .  clopidogrel (PLAVIX) 75 MG tablet, Take 1  tablet (75 mg total) by mouth daily., Disp: 90 tablet, Rfl: 3 .  Coenzyme Q10 (CO Q 10) 100 MG CAPS, Take 1 tablet by mouth daily. , Disp: , Rfl:  .  ezetimibe (ZETIA) 10 MG tablet, Take 1 tablet (10 mg total) by mouth daily., Disp: 90 tablet, Rfl: 4 .  HYDROcodone-acetaminophen (NORCO/VICODIN) 5-325 MG tablet, Take 1 tablet by mouth every 6 (six) hours as needed. To be filled after 10/19/2015, Disp: 120 tablet, Rfl: 0 .  Liraglutide (VICTOZA) 18 MG/3ML SOLN injection, Inject 1.8 mg into the skin daily. , Disp: , Rfl:  .  lisinopril (PRINIVIL,ZESTRIL) 40 MG tablet, Take 1 tablet (40 mg total) by mouth daily., Disp: 90 tablet, Rfl: 3 .  meclizine (ANTIVERT) 25 MG tablet, Take by mouth as needed. , Disp: , Rfl:  .  metFORMIN (GLUCOPHAGE) 1000 MG tablet, Take 1,000 mg by mouth 2 (two) times daily with a meal., Disp: , Rfl:  .  metoprolol tartrate (LOPRESSOR) 25 MG tablet, Take 1 tablet (25 mg total) by mouth 2 (two) times daily., Disp: 180 tablet, Rfl: 3 .  Multiple Vitamins-Minerals (DAILY MULTI) TABS, Take 1 tablet by mouth daily. , Disp: , Rfl:  .  nitroGLYCERIN (NITROSTAT) 0.4 MG SL tablet, Place 1 tablet (0.4 mg total) under the tongue every 5 (five) minutes as needed., Disp: 25 tablet, Rfl: 6 .  Omega-3 Fatty Acids (FISH OIL) 1000 MG CAPS, Take 2 capsules by mouth 2 (two) times daily. Take 4, Disp: ,  Rfl:  .  omeprazole (PRILOSEC) 20 MG capsule, Take 20 mg by mouth as needed. , Disp: , Rfl:  .  ondansetron (ZOFRAN) 4 MG tablet, Take 4 mg by mouth every 8 (eight) hours as needed for nausea or vomiting., Disp: , Rfl:  .  RABEprazole (ACIPHEX) 20 MG tablet, Take 20 mg by mouth daily. , Disp: , Rfl:  .  rosuvastatin (CRESTOR) 40 MG tablet, Take 1 tablet (40 mg total) by mouth daily., Disp: 90 tablet, Rfl: 3 .  sucralfate (CARAFATE) 1 g tablet, Take 1 g by mouth 2 (two) times daily. , Disp: , Rfl:  .  tiotropium (SPIRIVA) 18 MCG inhalation capsule, Place 1 capsule (18 mcg total) into inhaler and  inhale daily., Disp: 90 capsule, Rfl: 3  Review of Systems  Constitutional: Negative.   HENT: Positive for rhinorrhea.   Respiratory: Positive for cough and wheezing.   Musculoskeletal: Positive for back pain.  Allergic/Immunologic: Positive for environmental allergies.    Social History  Substance Use Topics  . Smoking status: Current Every Day Smoker    Packs/day: 1.00    Years: 40.00    Types: Cigarettes  . Smokeless tobacco: Never Used  . Alcohol use Yes     Comment: occasional   Objective:   BP 126/78 (BP Location: Left Arm, Patient Position: Sitting, Cuff Size: Large)   Pulse 80   Temp 98.1 F (36.7 C) (Oral)   Resp 20   Wt 163 lb (73.9 kg)   SpO2 97%   BMI 26.31 kg/m   Physical Exam   General Appearance:    Alert, cooperative, no distress  Eyes:    PERRL, conjunctiva/corneas clear, EOM's intact       Lungs:     Clear to auscultation bilaterally, respirations unlabored  Heart:    Regular rate and rhythm  Neurologic:   Awake, alert, oriented x 3. No apparent focal neurological           defect.           Assessment & Plan:     1. Diabetes mellitus due to underlying condition with diabetic nephropathy, unspecified long term insulin use status ([place]) Continue routine follow up with endocrinology.  2. Need for influenza vaccination  - Flu vaccine HIGH DOSE PF  3. Benign essential HTN Well controlled.  Continue current medications.    4. Chronic obstructive pulmonary disease, unspecified COPD type ([place]) Doing well with prn albuterol.inhaler.      The entirety of the information documented in the History of Present Illness, Review of Systems and Physical Exam were personally obtained by me. Portions of this information were initially documented by Lynford Humphrey, CMA and reviewed by me for thoroughness and accuracy.    Lelon Huh, MD  [facility]

## 2015-11-24 NOTE — Patient Instructions (Addendum)
Please stop smoking  Smoking Cessation, Tips for Success If you are ready to quit smoking, congratulations! You have chosen to help yourself be healthier. Cigarettes bring nicotine, tar, carbon monoxide, and other irritants into your body. Your lungs, heart, and blood vessels will be able to work better without these poisons. There are many different ways to quit smoking. Nicotine gum, nicotine patches, a nicotine inhaler, or nicotine nasal spray can help with physical craving. Hypnosis, support groups, and medicines help break the habit of smoking. WHAT THINGS CAN I DO TO MAKE QUITTING EASIER?  Here are some tips to help you quit for good:  Pick a date when you will quit smoking completely. Tell all of your friends and family about your plan to quit on that date.  Do not try to slowly cut down on the number of cigarettes you are smoking. Pick a quit date and quit smoking completely starting on that day.  Throw away all cigarettes.   Clean and remove all ashtrays from your home, work, and car.  On a card, write down your reasons for quitting. Carry the card with you and read it when you get the urge to smoke.  Cleanse your body of nicotine. Drink enough water and fluids to keep your urine clear or pale yellow. Do this after quitting to flush the nicotine from your body.  Learn to predict your moods. Do not let a bad situation be your excuse to have a cigarette. Some situations in your life might tempt you into wanting a cigarette.  Never have "just one" cigarette. It leads to wanting another and another. Remind yourself of your decision to quit.  Change habits associated with smoking. If you smoked while driving or when feeling stressed, try other activities to replace smoking. Stand up when drinking your coffee. Brush your teeth after eating. Sit in a different chair when you read the paper. Avoid alcohol while trying to quit, and try to drink fewer caffeinated beverages. Alcohol and  caffeine may urge you to smoke.  Avoid foods and drinks that can trigger a desire to smoke, such as sugary or spicy foods and alcohol.  Ask people who smoke not to smoke around you.  Have something planned to do right after eating or having a cup of coffee. For example, plan to take a walk or exercise.  Try a relaxation exercise to calm you down and decrease your stress. Remember, you may be tense and nervous for the first 2 weeks after you quit, but this will pass.  Find new activities to keep your hands busy. Play with a pen, coin, or rubber band. Doodle or draw things on paper.  Brush your teeth right after eating. This will help cut down on the craving for the taste of tobacco after meals. You can also try mouthwash.   Use oral substitutes in place of cigarettes. Try using lemon drops, carrots, cinnamon sticks, or chewing gum. Keep them handy so they are available when you have the urge to smoke.  When you have the urge to smoke, try deep breathing.  Designate your home as a nonsmoking area.  If you are a heavy smoker, ask your health care provider about a prescription for nicotine chewing gum. It can ease your withdrawal from nicotine.  Reward yourself. Set aside the cigarette money you save and buy yourself something nice.  Look for support from others. Join a support group or smoking cessation program. Ask someone at home or at work to help  you with your plan to quit smoking.  Always ask yourself, "Do I need this cigarette or is this just a reflex?" Tell yourself, "Today, I choose not to smoke," or "I do not want to smoke." You are reminding yourself of your decision to quit.  Do not replace cigarette smoking with electronic cigarettes (commonly called e-cigarettes). The safety of e-cigarettes is unknown, and some may contain harmful chemicals.  If you relapse, do not give up! Plan ahead and think about what you will do the next time you get the urge to smoke. HOW WILL I FEEL  WHEN I QUIT SMOKING? You may have symptoms of withdrawal because your body is used to nicotine (the addictive substance in cigarettes). You may crave cigarettes, be irritable, feel very hungry, cough often, get headaches, or have difficulty concentrating. The withdrawal symptoms are only temporary. They are strongest when you first quit but will go away within 10-14 days. When withdrawal symptoms occur, stay in control. Think about your reasons for quitting. Remind yourself that these are signs that your body is healing and getting used to being without cigarettes. Remember that withdrawal symptoms are easier to treat than the major diseases that smoking can cause.  Even after the withdrawal is over, expect periodic urges to smoke. However, these cravings are generally short lived and will go away whether you smoke or not. Do not smoke! WHAT RESOURCES ARE AVAILABLE TO HELP ME QUIT SMOKING? Your health care provider can direct you to community resources or hospitals for support, which may include:  Group support.  Education.  Hypnosis.  Therapy.   This information is not intended to replace advice given to you by your health care provider. Make sure you discuss any questions you have with your health care provider.   Document Released: 11/06/2003 Document Revised: 02/28/2014 Document Reviewed: 07/26/2012 Elsevier Interactive Patient Education [employer].

## 2015-12-30 DIAGNOSIS — E1165 Type 2 diabetes mellitus with hyperglycemia: Secondary | ICD-10-CM | POA: Diagnosis not present

## 2015-12-30 DIAGNOSIS — R809 Proteinuria, unspecified: Secondary | ICD-10-CM | POA: Diagnosis not present

## 2015-12-30 DIAGNOSIS — E1122 Type 2 diabetes mellitus with diabetic chronic kidney disease: Secondary | ICD-10-CM | POA: Diagnosis not present

## 2015-12-30 DIAGNOSIS — N183 Chronic kidney disease, stage 3 (moderate): Secondary | ICD-10-CM | POA: Diagnosis not present

## 2015-12-30 DIAGNOSIS — I129 Hypertensive chronic kidney disease with stage 1 through stage 4 chronic kidney disease, or unspecified chronic kidney disease: Secondary | ICD-10-CM | POA: Diagnosis not present

## 2016-01-05 DIAGNOSIS — E1165 Type 2 diabetes mellitus with hyperglycemia: Secondary | ICD-10-CM | POA: Diagnosis not present

## 2016-01-05 DIAGNOSIS — E781 Pure hyperglyceridemia: Secondary | ICD-10-CM | POA: Diagnosis not present

## 2016-01-05 DIAGNOSIS — N183 Chronic kidney disease, stage 3 (moderate): Secondary | ICD-10-CM | POA: Diagnosis not present

## 2016-01-05 DIAGNOSIS — E785 Hyperlipidemia, unspecified: Secondary | ICD-10-CM | POA: Diagnosis not present

## 2016-01-05 DIAGNOSIS — Z8639 Personal history of other endocrine, nutritional and metabolic disease: Secondary | ICD-10-CM | POA: Diagnosis not present

## 2016-01-05 DIAGNOSIS — E1122 Type 2 diabetes mellitus with diabetic chronic kidney disease: Secondary | ICD-10-CM | POA: Diagnosis not present

## 2016-01-13 ENCOUNTER — Encounter: Payer: Self-pay | Admitting: Cardiovascular Disease

## 2016-03-04 DIAGNOSIS — J209 Acute bronchitis, unspecified: Secondary | ICD-10-CM | POA: Diagnosis not present

## 2016-03-08 ENCOUNTER — Encounter: Payer: Self-pay | Admitting: Family Medicine

## 2016-03-22 ENCOUNTER — Ambulatory Visit (INDEPENDENT_AMBULATORY_CARE_PROVIDER_SITE_OTHER): Payer: Medicare Other | Admitting: Family Medicine

## 2016-03-22 ENCOUNTER — Encounter: Payer: Self-pay | Admitting: Family Medicine

## 2016-03-22 ENCOUNTER — Telehealth: Payer: Self-pay | Admitting: Family Medicine

## 2016-03-22 VITALS — BP 110/70 | HR 91 | Temp 98.4°F | Resp 18 | Wt 160.0 lb

## 2016-03-22 DIAGNOSIS — E781 Pure hyperglyceridemia: Secondary | ICD-10-CM | POA: Diagnosis not present

## 2016-03-22 DIAGNOSIS — E785 Hyperlipidemia, unspecified: Secondary | ICD-10-CM | POA: Diagnosis not present

## 2016-03-22 DIAGNOSIS — R05 Cough: Secondary | ICD-10-CM | POA: Diagnosis not present

## 2016-03-22 DIAGNOSIS — M48061 Spinal stenosis, lumbar region without neurogenic claudication: Secondary | ICD-10-CM

## 2016-03-22 DIAGNOSIS — J449 Chronic obstructive pulmonary disease, unspecified: Secondary | ICD-10-CM

## 2016-03-22 DIAGNOSIS — E1165 Type 2 diabetes mellitus with hyperglycemia: Secondary | ICD-10-CM | POA: Diagnosis not present

## 2016-03-22 DIAGNOSIS — R059 Cough, unspecified: Secondary | ICD-10-CM

## 2016-03-22 LAB — HEMOGLOBIN A1C: HEMOGLOBIN A1C: 8.5

## 2016-03-22 LAB — BASIC METABOLIC PANEL: GLUCOSE: 170 mg/dL

## 2016-03-22 MED ORDER — HYDROCODONE-ACETAMINOPHEN 5-325 MG PO TABS: 1.0000 | ORAL_TABLET | Freq: Four times a day (QID) | ORAL | 0 refills | Status: DC | PRN

## 2016-03-22 MED ORDER — MONTELUKAST SODIUM 10 MG PO TABS: 10.0000 mg | ORAL_TABLET | Freq: Every day | ORAL | 0 refills | Status: DC

## 2016-03-22 MED ORDER — LEVOFLOXACIN 500 MG PO TABS: 500.0000 mg | ORAL_TABLET | Freq: Every day | ORAL | 0 refills | Status: DC

## 2016-03-22 NOTE — Telephone Encounter (Signed)
Pt stated that she forgot to ask Dr. Caryn Section at her OV today if she should stop or continue taking MucineX. Please advise. Thanks TNP

## 2016-03-22 NOTE — Telephone Encounter (Signed)
Advised to continue the Mucinex while coughing and having the congestion ED

## 2016-03-22 NOTE — Progress Notes (Signed)
Patient: Hannah Kirby Female    DOB: 1945/09/30   71 y.o.   MRN: [id number] Visit Date: 03/22/2016  Today's Provider: Lelon Huh, MD   Chief Complaint  Patient presents with  . Cough   Subjective:    Patient has had cough 3 weeks. Patient went to urgent care and was diagnosed with bronchitis. Patient was prescribed prednisone, levaquin and a cough medication. Patient's symptoms improved for several days, but has flard back up over the past week. Patient has sob, chest tightness, and wheezing.    Cough  This is a recurrent problem. The current episode started more than 1 month ago. The problem has been unchanged. The problem occurs every few minutes. The cough is productive of sputum. Associated symptoms include ear congestion, headaches, nasal congestion, shortness of breath and wheezing. Pertinent negatives include no chest pain, chills, ear pain, fever, heartburn, hemoptysis, myalgias, postnasal drip, rash, rhinorrhea, sore throat, sweats or weight loss. The symptoms are aggravated by lying down. She has tried ipratropium inhaler and steroid inhaler (prednisone andd mucinex) for the symptoms. The treatment provided mild relief. Her past medical history is significant for bronchitis and COPD. There is no history of asthma, bronchiectasis, emphysema, environmental allergies or pneumonia.       Allergies  Allergen Reactions  . Sulfonamide Derivatives Other (See Comments)    Last taken as a child; made her mouth break out  . Codeine Nausea Only  . Prednisone Palpitations and Other (See Comments)    Made her legs "feel weird."     Current Outpatient Prescriptions:  .  albuterol (VENTOLIN HFA) 108 (90 Base) MCG/ACT inhaler, Inhale 2 puffs into the lungs every 4 (four) hours as needed for wheezing or shortness of breath., Disp: 48 g, Rfl: 3 .  aspirin (ASPIR-81) 81 MG EC tablet, Take 81 mg by mouth daily.  , Disp: , Rfl:  .  chlorhexidine (PERIDEX) 0.12 % solution, Use as  directed 15 mLs in the mouth or throat 2 (two) times daily. (Patient taking differently: Use as directed 15 mLs in the mouth or throat as needed. ), Disp: 120 mL, Rfl: 6 .  clopidogrel (PLAVIX) 75 MG tablet, Take 1 tablet (75 mg total) by mouth daily., Disp: 90 tablet, Rfl: 3 .  ezetimibe (ZETIA) 10 MG tablet, Take 1 tablet (10 mg total) by mouth daily., Disp: 90 tablet, Rfl: 4 .  HYDROcodone-acetaminophen (NORCO/VICODIN) 5-325 MG tablet, Take 1 tablet by mouth every 6 (six) hours as needed., Disp: 120 tablet, Rfl: 0 .  Liraglutide (VICTOZA) 18 MG/3ML SOLN injection, Inject 1.8 mg into the skin daily. , Disp: , Rfl:  .  lisinopril (PRINIVIL,ZESTRIL) 40 MG tablet, Take 1 tablet (40 mg total) by mouth daily., Disp: 90 tablet, Rfl: 3 .  meclizine (ANTIVERT) 25 MG tablet, Take by mouth as needed. , Disp: , Rfl:  .  metFORMIN (GLUCOPHAGE) 1000 MG tablet, Take 1,000 mg by mouth 2 (two) times daily with a meal., Disp: , Rfl:  .  metoprolol tartrate (LOPRESSOR) 25 MG tablet, Take 1 tablet (25 mg total) by mouth 2 (two) times daily., Disp: 180 tablet, Rfl: 3 .  Multiple Vitamins-Minerals (DAILY MULTI) TABS, Take 1 tablet by mouth daily. , Disp: , Rfl:  .  nitroGLYCERIN (NITROSTAT) 0.4 MG SL tablet, Place 1 tablet (0.4 mg total) under the tongue every 5 (five) minutes as needed., Disp: 25 tablet, Rfl: 6 .  Omega-3 Fatty Acids (FISH OIL) 1000 MG CAPS, Take 2  capsules by mouth 2 (two) times daily. Take 4, Disp: , Rfl:  .  omeprazole (PRILOSEC) 20 MG capsule, Take 20 mg by mouth as needed. , Disp: , Rfl:  .  ondansetron (ZOFRAN) 4 MG tablet, Take 4 mg by mouth every 8 (eight) hours as needed for nausea or vomiting., Disp: , Rfl:  .  rosuvastatin (CRESTOR) 40 MG tablet, Take 1 tablet (40 mg total) by mouth daily., Disp: 90 tablet, Rfl: 3 .  tiotropium (SPIRIVA) 18 MCG inhalation capsule, Place 1 capsule (18 mcg total) into inhaler and inhale daily., Disp: 90 capsule, Rfl: 3 .  Coenzyme Q10 (CO Q 10) 100 MG CAPS,  Take 1 tablet by mouth daily. , Disp: , Rfl:  .  RABEprazole (ACIPHEX) 20 MG tablet, Take 20 mg by mouth daily. , Disp: , Rfl:  .  sucralfate (CARAFATE) 1 g tablet, Take 1 g by mouth 2 (two) times daily. , Disp: , Rfl:   Review of Systems  Constitutional: Negative for appetite change, chills, fatigue, fever and weight loss.  HENT: Positive for congestion. Negative for ear pain, postnasal drip, rhinorrhea and sore throat.   Respiratory: Positive for cough, chest tightness, shortness of breath and wheezing. Negative for hemoptysis.   Cardiovascular: Negative for chest pain and palpitations.  Gastrointestinal: Negative for abdominal pain, heartburn, nausea and vomiting.  Musculoskeletal: Negative for myalgias.  Skin: Negative for rash.  Allergic/Immunologic: Negative for environmental allergies.  Neurological: Positive for headaches. Negative for dizziness and weakness.    Social History  Substance Use Topics  . Smoking status: Current Every Day Smoker    Packs/day: 1.00    Years: 40.00    Types: Cigarettes  . Smokeless tobacco: Never Used  . Alcohol use Yes     Comment: occasional   Objective:   BP 110/70 (BP Location: Right Arm, Patient Position: Sitting, Cuff Size: Normal)   Pulse 91   Temp 98.4 F (36.9 C) (Oral)   Resp 18   Wt 160 lb (72.6 kg)   SpO2 93%   BMI 25.82 kg/m   Physical Exam  General Appearance:    Alert, cooperative, no distress  HENT:   bilateral TM normal without fluid or infection, neck without nodes, throat normal without erythema or exudate, post nasal drip noted and nasal mucosa pale and congested  Eyes:    PERRL, conjunctiva/corneas clear, EOM's intact       Lungs:     Wheezes, no rales respirations unlabored  Heart:    Regular rate and rhythm  Neurologic:   Awake, alert, oriented x 3. No apparent focal neurological           defect.           Assessment & Plan:     1. Cough Responded well to levaquin prescribed at urgent care, but symptom now  just as bad. Likely aggravated by post nasal drainage and will also prescribe montelukast as below.  - levofloxacin (LEVAQUIN) 500 MG tablet; Take 1 tablet (500 mg total) by mouth daily.  Dispense: 7 tablet; Refill: 0  2. Chronic obstructive pulmonary disease, unspecified COPD type ([facility])  - levofloxacin (LEVAQUIN) 500 MG tablet; Take 1 tablet (500 mg total) by mouth daily.  Dispense: 7 tablet; Refill: 0 - montelukast (SINGULAIR) 10 MG tablet; Take 1 tablet (10 mg total) by mouth at bedtime.  Dispense: 30 tablet; Refill: 0  3. Spinal stenosis of lumbar region, unspecified whether neurogenic claudication present Refill medications.  - HYDROcodone-acetaminophen (NORCO/VICODIN) 5-325 MG  tablet; Take 1 tablet by mouth every 6 (six) hours as needed.  Dispense: 120 tablet; Refill: 0     The entirety of the information documented in the History of Present Illness, Review of Systems and Physical Exam were personally obtained by me. Portions of this information were initially documented by April M. [name] Heck, CMA and reviewed by me for thoroughness and accuracy.    Lelon Huh, MD  [facility]

## 2016-03-25 ENCOUNTER — Telehealth: Payer: Self-pay | Admitting: Cardiovascular Disease

## 2016-03-25 DIAGNOSIS — K2270 Barrett's esophagus without dysplasia: Secondary | ICD-10-CM | POA: Diagnosis not present

## 2016-03-25 NOTE — Telephone Encounter (Signed)
Received cardiac clearance request for pt to proceed w/ elective EGD on 07/19/16 w/ Dr. Gustavo Lah. Pt is due for f/u appt in March, will defer clearance until her next visit.

## 2016-03-29 ENCOUNTER — Other Ambulatory Visit: Payer: Self-pay | Admitting: Cardiovascular Disease

## 2016-03-29 ENCOUNTER — Ambulatory Visit: Payer: Medicare Other

## 2016-03-29 DIAGNOSIS — E781 Pure hyperglyceridemia: Secondary | ICD-10-CM | POA: Diagnosis not present

## 2016-03-29 DIAGNOSIS — E1122 Type 2 diabetes mellitus with diabetic chronic kidney disease: Secondary | ICD-10-CM | POA: Diagnosis not present

## 2016-03-29 DIAGNOSIS — Z794 Long term (current) use of insulin: Secondary | ICD-10-CM | POA: Diagnosis not present

## 2016-03-29 DIAGNOSIS — Z8639 Personal history of other endocrine, nutritional and metabolic disease: Secondary | ICD-10-CM | POA: Diagnosis not present

## 2016-03-29 DIAGNOSIS — N183 Chronic kidney disease, stage 3 (moderate): Secondary | ICD-10-CM | POA: Diagnosis not present

## 2016-03-29 DIAGNOSIS — E1165 Type 2 diabetes mellitus with hyperglycemia: Secondary | ICD-10-CM | POA: Diagnosis not present

## 2016-03-29 DIAGNOSIS — E785 Hyperlipidemia, unspecified: Secondary | ICD-10-CM | POA: Diagnosis not present

## 2016-03-29 DIAGNOSIS — I739 Peripheral vascular disease, unspecified: Secondary | ICD-10-CM

## 2016-04-20 ENCOUNTER — Other Ambulatory Visit: Payer: Self-pay | Admitting: Family Medicine

## 2016-04-20 ENCOUNTER — Other Ambulatory Visit: Payer: Self-pay | Admitting: *Deleted

## 2016-04-20 ENCOUNTER — Encounter: Payer: Self-pay | Admitting: Cardiovascular Disease

## 2016-04-20 DIAGNOSIS — J449 Chronic obstructive pulmonary disease, unspecified: Secondary | ICD-10-CM

## 2016-04-20 MED ORDER — MONTELUKAST SODIUM 10 MG PO TABS: 10.0000 mg | ORAL_TABLET | Freq: Every day | ORAL | 12 refills | Status: DC

## 2016-04-20 MED ORDER — EZETIMIBE 10 MG PO TABS: 10.0000 mg | ORAL_TABLET | Freq: Every day | ORAL | 0 refills | Status: DC

## 2016-04-20 NOTE — Telephone Encounter (Signed)
Refill request for Singulair from patient. Please review-aa

## 2016-04-22 NOTE — Telephone Encounter (Signed)
Pt sched to see Dr. [name] on 05/11/16 to discuss.

## 2016-05-11 ENCOUNTER — Encounter: Payer: Self-pay | Admitting: Cardiovascular Disease

## 2016-05-11 ENCOUNTER — Ambulatory Visit (INDEPENDENT_AMBULATORY_CARE_PROVIDER_SITE_OTHER): Payer: Medicare Other | Admitting: Cardiovascular Disease

## 2016-05-11 VITALS — BP 138/84 | HR 81 | Ht 65.0 in | Wt 159.0 lb

## 2016-05-11 DIAGNOSIS — F17200 Nicotine dependence, unspecified, uncomplicated: Secondary | ICD-10-CM | POA: Diagnosis not present

## 2016-05-11 DIAGNOSIS — E0821 Diabetes mellitus due to underlying condition with diabetic nephropathy: Secondary | ICD-10-CM | POA: Diagnosis not present

## 2016-05-11 DIAGNOSIS — N183 Chronic kidney disease, stage 3 unspecified: Secondary | ICD-10-CM

## 2016-05-11 DIAGNOSIS — R079 Chest pain, unspecified: Secondary | ICD-10-CM

## 2016-05-11 DIAGNOSIS — I10 Essential (primary) hypertension: Secondary | ICD-10-CM

## 2016-05-11 DIAGNOSIS — I2510 Atherosclerotic heart disease of native coronary artery without angina pectoris: Secondary | ICD-10-CM | POA: Diagnosis not present

## 2016-05-11 DIAGNOSIS — E785 Hyperlipidemia, unspecified: Secondary | ICD-10-CM

## 2016-05-11 NOTE — Patient Instructions (Addendum)
Medication Instructions:   No medication changes made  Labwork:  Liver and lipid at [facility], fasting  Testing/Procedures:  No further testing at this time   Follow-Up: It was a pleasure seeing you in the office today. Please call us if you have new issues that need to be addressed before your next appt.  [phone]  Your physician wants you to follow-up in: 6 months.  You will receive a reminder letter in the mail two months in advance. If you don't receive a letter, please call our office to schedule the follow-up appointment.  If you need a refill on your cardiac medications before your next appointment, please call your pharmacy.

## 2016-05-11 NOTE — Progress Notes (Signed)
Cardiology Office Note  Date:  05/11/2016   ID:  [name], DOB 06/09/1945, MRN [id number]  PCP:  Lelon Huh, MD   Chief Complaint  Patient presents with  . OTHER    6 month f/u no complaints today. Meds reviewed verbally with pt.    HPI:  [name] is a 71 year-old woman with a history of diabetes, obesity, significantly improved with  weight loss on Victoza, coronary artery disease with stenting of her RCA in January 2008, long smoking history who continues to smoke, hyperlipidemia who presents for routine followup of her coronary artery disease. outbreak of her granulomatous disease in summary 2013, prednisone for this condition.  History pinched nerve and is on pain medication.  In follow-up today she reports that she is doing relatively well Recent episode of severe bronchitis on prednisone x 2 With Levaquin Slowly symptoms have improved now back to baseline In the setting her HBA1C elevated Also possibly from poor diet-  She is currently on albvuterol and spirivia  Most recent Cr 1.6 On victoza,  does not drink much fluids Continues to smoke one pack per day Denies having any significant chest pain on exertion  Tolerating crestor Of note is her total cholesterol of 75 LDL of 9 in January Unclear if this was lab error  History of GERD and Barrett's esophagus per the patient. Takes omeprazole Chronic back pain  EKG Personally reviewed by myself on today's visit shows normal sinus rhythm with rate 81 bpm, no significant ST abnormality, T wave abnormality mildly 1 and aVL, No change from previous EKGs  Other past medical history stress test for chest pain 07/29/2014 that showed no ischemia chronicproblems with her teeth  episode of Dehydration after diarrhea mid September 2017 Starting having rectal bleeding Stopped her Plavix for several days with improvement of her symptoms   working with Dr. Eddie Dibbles on her diabetes.    PMH:   has  a past medical history of Anemia; Arthritis; CAD (coronary artery disease); Chronic bronchitis ([place]); COPD (chronic obstructive pulmonary disease) ([place]); Diabetes mellitus; FHx: allergies; GERD (gastroesophageal reflux disease); Goiter; Granulomatous disease ([place]); Hernia; Hyperlipidemia; Hypertension; Kidney stone on left side (2013); Microalbuminuria; Obesity; and Spinal stenosis.  PSH:    Past Surgical History:  Procedure Laterality Date  . ABDOMINAL HYSTERECTOMY    . BREAST BIOPSY    . COLONOSCOPY WITH PROPOFOL N/A 09/23/2014   Procedure: COLONOSCOPY WITH PROPOFOL;  Surgeon: Lollie Sails, MD;  Location: [facility] ENDOSCOPY;  Service: Endoscopy;  Laterality: N/A;  . CORONARY ANGIOPLASTY WITH STENT PLACEMENT  2008  . ESOPHAGOGASTRODUODENOSCOPY (EGD) WITH PROPOFOL N/A 12/30/2014   Procedure: ESOPHAGOGASTRODUODENOSCOPY (EGD) WITH PROPOFOL;  Surgeon: Lollie Sails, MD;  Location: [facility] ENDOSCOPY;  Service: Endoscopy;  Laterality: N/A;  . hysterectomy (other)      Current Outpatient Prescriptions  Medication Sig Dispense Refill  . albuterol (VENTOLIN HFA) 108 (90 Base) MCG/ACT inhaler Inhale 2 puffs into the lungs every 4 (four) hours as needed for wheezing or shortness of breath. 48 g 3  . aspirin (ASPIR-81) 81 MG EC tablet Take 81 mg by mouth daily.      . chlorhexidine (PERIDEX) 0.12 % solution Use as directed 15 mLs in the mouth or throat 2 (two) times daily. (Patient taking differently: Use as directed 15 mLs in the mouth or throat as needed. ) 120 mL 6  . clopidogrel (PLAVIX) 75 MG tablet Take 1 tablet (75 mg total) by mouth daily. 90 tablet 3  .  ezetimibe (ZETIA) 10 MG tablet Take 1 tablet (10 mg total) by mouth daily. 30 tablet 0  . HYDROcodone-acetaminophen (NORCO/VICODIN) 5-325 MG tablet Take 1 tablet by mouth every 6 (six) hours as needed. 120 tablet 0  . insulin glargine (LANTUS) 100 UNIT/ML injection Inject 14 Units into the skin daily.    . Liraglutide (VICTOZA) 18 MG/3ML SOLN  injection Inject 1.8 mg into the skin daily.     Marland Kitchen lisinopril (PRINIVIL,ZESTRIL) 40 MG tablet Take 1 tablet (40 mg total) by mouth daily. 90 tablet 3  . meclizine (ANTIVERT) 25 MG tablet Take by mouth as needed.     . metFORMIN (GLUCOPHAGE) 1000 MG tablet Take 1,000 mg by mouth 2 (two) times daily with a meal.    . metoprolol tartrate (LOPRESSOR) 25 MG tablet Take 1 tablet (25 mg total) by mouth 2 (two) times daily. 180 tablet 3  . montelukast (SINGULAIR) 10 MG tablet Take 1 tablet (10 mg total) by mouth at bedtime. 30 tablet 12  . Multiple Vitamins-Minerals (DAILY MULTI) TABS Take 1 tablet by mouth daily.     . nitroGLYCERIN (NITROSTAT) 0.4 MG SL tablet Place 1 tablet (0.4 mg total) under the tongue every 5 (five) minutes as needed. 25 tablet 6  . Omega-3 Fatty Acids (FISH OIL) 1000 MG CAPS Take 2 capsules by mouth 2 (two) times daily. Take 4    . ondansetron (ZOFRAN) 4 MG tablet Take 4 mg by mouth every 8 (eight) hours as needed for nausea or vomiting.    . rosuvastatin (CRESTOR) 40 MG tablet Take 1 tablet (40 mg total) by mouth daily. 90 tablet 3  . sucralfate (CARAFATE) 1 g tablet Take 1 g by mouth 2 (two) times daily.     Marland Kitchen tiotropium (SPIRIVA) 18 MCG inhalation capsule Place 1 capsule (18 mcg total) into inhaler and inhale daily. 90 capsule 3  . RABEprazole (ACIPHEX) 20 MG tablet Take 20 mg by mouth daily.      No current facility-administered medications for this visit.      Allergies:   Sulfonamide derivatives; Codeine; and Prednisone   Social History:  The patient  reports that she has been smoking Cigarettes.  She has a 40.00 pack-year smoking history. She has never used smokeless tobacco. She reports that she drinks alcohol. She reports that she does not use drugs.   Family History:   family history includes Breast cancer in her maternal aunt; Congestive Heart Failure in her mother; Diabetes in her mother; Heart failure in her mother; Lung cancer in her father; Stroke in her mother.     Review of Systems: Review of Systems  Constitutional: Negative.   Respiratory: Negative.   Cardiovascular: Negative.   Gastrointestinal: Negative.   Musculoskeletal: Positive for back pain.  Neurological: Negative.   Psychiatric/Behavioral: Negative.   All other systems reviewed and are negative.    PHYSICAL EXAM: VS:  BP 138/84 (BP Location: Left Arm, Patient Position: Sitting, Cuff Size: Normal)   Pulse 81   Ht 5\' 5"  (1.651 m)   Wt 159 lb (72.1 kg)   BMI 26.46 kg/m  , BMI Body mass index is 26.46 kg/m. GEN: Well nourished, well developed, in no acute distress  HEENT: normal  Neck: no JVD, carotid bruits, or masses Cardiac: RRR; no murmurs, rubs, or gallops,no edema  Respiratory:  clear to auscultation bilaterally, normal work of breathing GI: soft, nontender, nondistended, + BS MS: no deformity or atrophy  Skin: warm and dry, no rash Neuro:  Strength  and sensation are intact Psych: euthymic mood, full affect    Recent Labs: 06/02/2015: Hemoglobin 13.4; Platelets 276 06/26/2015: BUN 33; Creatinine 1.7; Potassium 4.1; Sodium 141    Lipid Panel No results found for: CHOL, HDL, LDLCALC, TRIG    Wt Readings from Last 3 Encounters:  05/11/16 159 lb (72.1 kg)  03/22/16 160 lb (72.6 kg)  11/24/15 163 lb (73.9 kg)       ASSESSMENT AND PLAN:  Hyperlipidemia, unspecified hyperlipidemia type - Plan: EKG 12-Lead, Hepatic function panel, Lipid Profile We have recommended repeat lipid panel given abnormal numbers in January 2018 We will continue Crestor and Zetia For now until new numbers come back  Coronary artery disease involving native coronary artery of native heart without angina pectoris -  Currently with no symptoms of angina. No further workup at this time. Continue current medication regimen.  Benign essential HTN - Plan: EKG 12-Lead, Hepatic function panel, Lipid Profile Blood pressure is well controlled on today's visit. No changes made to the  medications.  Chest pain at rest - Plan: EKG 12-Lead, Hepatic function panel, Lipid Profile No significant chest pain on today's visit We have recommended regular exercise program  Smoker We have encouraged her to continue to work on weaning her cigarettes and smoking cessation. She will continue to work on this and does not want any assistance with chantix.   Chronic renal insufficiency  Likely secondary to Long-standing diabetes Recommended gentle hydration  Disposition:   F/U  6 months   Total encounter time more than 25 minutes  Greater than 50% was spent in counseling and coordination of care with the patient    Orders Placed This Encounter  Procedures  . Hepatic function panel  . Lipid Profile  . EKG 12-Lead     Signed, Esmond Plants, M.D., Ph.D. 05/11/2016  [place], [place]

## 2016-05-12 ENCOUNTER — Other Ambulatory Visit (INDEPENDENT_AMBULATORY_CARE_PROVIDER_SITE_OTHER): Payer: Medicare Other

## 2016-05-12 DIAGNOSIS — R079 Chest pain, unspecified: Secondary | ICD-10-CM

## 2016-05-12 DIAGNOSIS — I10 Essential (primary) hypertension: Secondary | ICD-10-CM

## 2016-05-12 DIAGNOSIS — E785 Hyperlipidemia, unspecified: Secondary | ICD-10-CM

## 2016-05-12 DIAGNOSIS — I2510 Atherosclerotic heart disease of native coronary artery without angina pectoris: Secondary | ICD-10-CM | POA: Diagnosis not present

## 2016-05-13 LAB — LIPID PANEL
CHOL/HDL RATIO: 2.8 ratio (ref 0.0–4.4)
CHOLESTEROL TOTAL: 79 mg/dL — AB (ref 100–199)
HDL: 28 mg/dL — AB (ref >39)
LDL CALC: 17 mg/dL (ref 0–99)
TRIGLYCERIDES: 170 mg/dL — AB (ref 0–149)
VLDL CHOLESTEROL CAL: 34 mg/dL (ref 5–40)

## 2016-05-13 LAB — HEPATIC FUNCTION PANEL
ALBUMIN: 4 g/dL (ref 3.6–4.8)
ALT: 62 IU/L — AB (ref 0–32)
AST: 65 IU/L — ABNORMAL HIGH (ref 0–40)
Alkaline Phosphatase: 139 IU/L — ABNORMAL HIGH (ref 39–117)
BILIRUBIN TOTAL: 0.4 mg/dL (ref 0.0–1.2)
Bilirubin, Direct: 0.14 mg/dL (ref 0.00–0.40)
Total Protein: 6.6 g/dL (ref 6.0–8.5)

## 2016-05-19 ENCOUNTER — Other Ambulatory Visit: Payer: Self-pay

## 2016-05-19 MED ORDER — ROSUVASTATIN CALCIUM 5 MG PO TABS: 5.0000 mg | ORAL_TABLET | Freq: Every day | ORAL | 3 refills | Status: DC

## 2016-05-30 ENCOUNTER — Ambulatory Visit (INDEPENDENT_AMBULATORY_CARE_PROVIDER_SITE_OTHER): Payer: Medicare Other | Admitting: Family Medicine

## 2016-05-30 ENCOUNTER — Encounter: Payer: Self-pay | Admitting: *Deleted

## 2016-05-30 ENCOUNTER — Encounter: Payer: Self-pay | Admitting: Family Medicine

## 2016-05-30 VITALS — BP 130/78 | HR 69 | Temp 97.7°F | Resp 16 | Ht 65.0 in | Wt 160.0 lb

## 2016-05-30 DIAGNOSIS — I2510 Atherosclerotic heart disease of native coronary artery without angina pectoris: Secondary | ICD-10-CM

## 2016-05-30 DIAGNOSIS — J449 Chronic obstructive pulmonary disease, unspecified: Secondary | ICD-10-CM

## 2016-05-30 DIAGNOSIS — E0821 Diabetes mellitus due to underlying condition with diabetic nephropathy: Secondary | ICD-10-CM | POA: Diagnosis not present

## 2016-05-30 DIAGNOSIS — R911 Solitary pulmonary nodule: Secondary | ICD-10-CM | POA: Diagnosis not present

## 2016-05-30 DIAGNOSIS — M48061 Spinal stenosis, lumbar region without neurogenic claudication: Secondary | ICD-10-CM | POA: Diagnosis not present

## 2016-05-30 DIAGNOSIS — M5416 Radiculopathy, lumbar region: Secondary | ICD-10-CM | POA: Diagnosis not present

## 2016-05-30 MED ORDER — HYDROCODONE-ACETAMINOPHEN 5-325 MG PO TABS: 1.0000 | ORAL_TABLET | Freq: Four times a day (QID) | ORAL | 0 refills | Status: DC | PRN

## 2016-05-30 MED ORDER — FLUTICASONE FUROATE-VILANTEROL 100-25 MCG/INH IN AEPB: 1.0000 | INHALATION_SPRAY | Freq: Every day | RESPIRATORY_TRACT | 0 refills | Status: DC

## 2016-05-30 MED ORDER — TIOTROPIUM BROMIDE MONOHYDRATE 18 MCG IN CAPS: 18.0000 ug | ORAL_CAPSULE | Freq: Every day | RESPIRATORY_TRACT | 4 refills | Status: DC

## 2016-05-30 NOTE — Progress Notes (Signed)
Patient: Hannah Kirby Female    DOB: [birth year]   71 y.o.   MRN: [id number] Visit Date: 05/30/2016  Today's Provider: Lelon Huh, MD   Chief Complaint  Patient presents with  . Follow-up  . Hypertension  . COPD   Subjective:    HPI    Hypertension, follow-up:  BP Readings from Last 3 Encounters:  05/30/16 130/78  05/11/16 138/84  03/22/16 110/70    She was last seen for hypertension 6 months ago.  BP at that visit was 126/78. Management since that visit includes; no changes.She reports good compliance with treatment. She is not having side effects. none She is not exercising. She is adherent to low salt diet.   Outside blood pressures are 130/80. She is experiencing none.  Patient denies none.   Cardiovascular risk factors include diabetes mellitus.  Use of agents associated with hypertension: none.    Chronic obstructive pulmonary disease, unspecified COPD type (Hannah Kirby) From 11/24/2015-no changes. Doing well with prn albuterol.inhaler. Using Spiriva daily. Uses albuterol 2-4 times every day. Will be due for lung CT in June due to small lung nodule and >30 pack year smoking history.   Diabetes mellitus due to underlying condition with diabetic nephropathy, unspecified long term insulin use status (Hannah Kirby) From 11/24/2015-Continue routine follow up with endocrinology. Last A1c 03/22/2016-8.5.Had lipids (chol=75 and LDL=9) in January. Rechecked by Dr. [name] and had her cut her dose of Crestor to 5 a day.  Follow up chronic back pain.  She continues to daily pain but is manageable on current dose of hydrocodone which she mostly takes on a schedudled. Denies any adverse effects from medication.   Allergies  Allergen Reactions  . Sulfonamide Derivatives Other (See Comments)    Last taken as a child; made her mouth break out  . Codeine Nausea Only  . Prednisone Palpitations and Other (See Comments)    Made her legs "feel weird."     Current Outpatient  Prescriptions:  .  albuterol (VENTOLIN HFA) 108 (90 Base) MCG/ACT inhaler, Inhale 2 puffs into the lungs every 4 (four) hours as needed for wheezing or shortness of breath., Disp: 48 g, Rfl: 3 .  aspirin (ASPIR-81) 81 MG EC tablet, Take 81 mg by mouth daily.  , Disp: , Rfl:  .  chlorhexidine (PERIDEX) 0.12 % solution, Use as directed 15 mLs in the mouth or throat 2 (two) times daily. (Patient taking differently: Use as directed 15 mLs in the mouth or throat as needed. ), Disp: 120 mL, Rfl: 6 .  clopidogrel (PLAVIX) 75 MG tablet, Take 1 tablet (75 mg total) by mouth daily., Disp: 90 tablet, Rfl: 3 .  ezetimibe (ZETIA) 10 MG tablet, Take 1 tablet (10 mg total) by mouth daily., Disp: 30 tablet, Rfl: 0 .  HYDROcodone-acetaminophen (NORCO/VICODIN) 5-325 MG tablet, Take 1 tablet by mouth every 6 (six) hours as needed., Disp: 120 tablet, Rfl: 0 .  insulin glargine (LANTUS) 100 UNIT/ML injection, Inject 16 Units into the skin daily. , Disp: , Rfl:  .  Liraglutide (VICTOZA) 18 MG/3ML SOLN injection, Inject 1.8 mg into the skin daily. , Disp: , Rfl:  .  lisinopril (PRINIVIL,ZESTRIL) 40 MG tablet, Take 1 tablet (40 mg total) by mouth daily., Disp: 90 tablet, Rfl: 3 .  meclizine (ANTIVERT) 25 MG tablet, Take by mouth as needed. , Disp: , Rfl:  .  metFORMIN (GLUCOPHAGE) 1000 MG tablet, Take 1,000 mg by mouth 2 (two) times daily with a meal.,  Disp: , Rfl:  .  metoprolol tartrate (LOPRESSOR) 25 MG tablet, Take 1 tablet (25 mg total) by mouth 2 (two) times daily., Disp: 180 tablet, Rfl: 3 .  Multiple Vitamins-Minerals (DAILY MULTI) TABS, Take 1 tablet by mouth daily. , Disp: , Rfl:  .  nitroGLYCERIN (NITROSTAT) 0.4 MG SL tablet, Place 1 tablet (0.4 mg total) under the tongue every 5 (five) minutes as needed., Disp: 25 tablet, Rfl: 6 .  Omega-3 Fatty Acids (FISH OIL) 1000 MG CAPS, Take 2 capsules by mouth 2 (two) times daily. Take 4, Disp: , Rfl:  .  ondansetron (ZOFRAN) 4 MG tablet, Take 4 mg by mouth every 8  (eight) hours as needed for nausea or vomiting., Disp: , Rfl:  .  rosuvastatin (CRESTOR) 5 MG tablet, Take 1 tablet (5 mg total) by mouth daily., Disp: 90 tablet, Rfl: 3 .  tiotropium (SPIRIVA) 18 MCG inhalation capsule, Place 1 capsule (18 mcg total) into inhaler and inhale daily., Disp: 90 capsule, Rfl: 3 .  montelukast (SINGULAIR) 10 MG tablet, Take 1 tablet (10 mg total) by mouth at bedtime., Disp: 30 tablet, Rfl: 12 .  RABEprazole (ACIPHEX) 20 MG tablet, Take 20 mg by mouth daily. , Disp: , Rfl:  .  sucralfate (CARAFATE) 1 g tablet, Take 1 g by mouth 2 (two) times daily. , Disp: , Rfl:   Review of Systems  Constitutional: Negative for appetite change, chills, fatigue and fever.  Respiratory: Negative for chest tightness and shortness of breath.   Cardiovascular: Negative for chest pain and palpitations.  Gastrointestinal: Negative for abdominal pain, nausea and vomiting.  Neurological: Negative for dizziness and weakness.    Social History  Substance Use Topics  . Smoking status: Current Every Day Smoker    Packs/day: 1.00    Years: 40.00    Types: Cigarettes  . Smokeless tobacco: Never Used  . Alcohol use Yes     Comment: occasional   Objective:   BP 130/78 (BP Location: Right Arm, Patient Position: Sitting, Cuff Size: Normal)   Pulse 69   Temp 97.7 F (36.5 C) (Oral)   Resp 16   Ht 5\' 5"  (1.651 m)   Wt 160 lb (72.6 kg)   SpO2 97%   BMI 26.63 kg/m  Vitals:   05/30/16 1109  BP: 130/78  Pulse: 69  Resp: 16  Temp: 97.7 F (36.5 C)  TempSrc: Oral  SpO2: 97%  Weight: 160 lb (72.6 kg)  Height: 5\' 5"  (1.651 m)     Physical Exam   General Appearance:    Alert, cooperative, no distress  Eyes:    PERRL, conjunctiva/corneas clear, EOM's intact       Lungs:     Clear to auscultation bilaterally, respirations unlabored. diminshed breath sounds.   Heart:    Regular rate and rhythm  Neurologic:   Awake, alert, oriented x 3. No apparent focal neurological            defect.           Assessment & Plan:     1. Chronic obstructive pulmonary disease, unspecified COPD type (Hannah Kirby) Compliant with Spiriva but still using rescue inhaler multiple times every day. Try samples of Breo daily and call for prescription if she finds this reduces frequency of requiring rescue inhaler.  - tiotropium (SPIRIVA) 18 MCG inhalation capsule; Place 1 capsule (18 mcg total) into inhaler and inhale daily.  Dispense: 90 capsule; Refill: 4 - fluticasone furoate-vilanterol (BREO ELLIPTA) 100-25 MCG/INH AEPB; Inhale 1  puff into the lungs daily.  Dispense: 28 each; Refill: 0  2. Spinal stenosis of lumbar region, unspecified whether neurogenic claudication present Pain adequately controlled. Continue current medications.  - HYDROcodone-acetaminophen (NORCO/VICODIN) 5-325 MG tablet; Take 1 tablet by mouth every 6 (six) hours as needed.  Dispense: 120 tablet; Refill: 0  3. Lumbar radiculopathy   4. Diabetes mellitus due to underlying condition with diabetic nephropathy, unspecified long term insulin use status ([place]) Doing well current regiment. Continue routine follow up with Dr. Eddie Dibbles.   Return in about 6 months (around 11/29/2016).        Hannah Huh, MD  [facility]

## 2016-06-08 ENCOUNTER — Other Ambulatory Visit: Payer: Self-pay | Admitting: *Deleted

## 2016-06-08 ENCOUNTER — Encounter: Payer: Self-pay | Admitting: Family Medicine

## 2016-06-08 ENCOUNTER — Encounter: Payer: Self-pay | Admitting: Cardiovascular Disease

## 2016-06-08 DIAGNOSIS — J449 Chronic obstructive pulmonary disease, unspecified: Secondary | ICD-10-CM

## 2016-06-08 MED ORDER — EZETIMIBE 10 MG PO TABS: 10.0000 mg | ORAL_TABLET | Freq: Every day | ORAL | 3 refills | Status: DC

## 2016-06-08 MED ORDER — FLUTICASONE FUROATE-VILANTEROL 100-25 MCG/INH IN AEPB: 1.0000 | INHALATION_SPRAY | Freq: Every day | RESPIRATORY_TRACT | 3 refills | Status: DC

## 2016-06-16 ENCOUNTER — Emergency Department
Admission: EM | Admit: 2016-06-16 | Discharge: 2016-06-16 | Disposition: A | Discharge status: Home / Self Care | Payer: Medicare Other | Attending: Student in an Organized Health Care Education/Training Program | Admitting: Student in an Organized Health Care Education/Training Program

## 2016-06-16 DIAGNOSIS — J449 Chronic obstructive pulmonary disease, unspecified: Secondary | ICD-10-CM | POA: Insufficient documentation

## 2016-06-16 DIAGNOSIS — F17210 Nicotine dependence, cigarettes, uncomplicated: Secondary | ICD-10-CM | POA: Insufficient documentation

## 2016-06-16 DIAGNOSIS — E1122 Type 2 diabetes mellitus with diabetic chronic kidney disease: Secondary | ICD-10-CM | POA: Diagnosis not present

## 2016-06-16 DIAGNOSIS — I129 Hypertensive chronic kidney disease with stage 1 through stage 4 chronic kidney disease, or unspecified chronic kidney disease: Secondary | ICD-10-CM | POA: Diagnosis not present

## 2016-06-16 DIAGNOSIS — I2510 Atherosclerotic heart disease of native coronary artery without angina pectoris: Secondary | ICD-10-CM | POA: Insufficient documentation

## 2016-06-16 DIAGNOSIS — Z7982 Long term (current) use of aspirin: Secondary | ICD-10-CM | POA: Diagnosis not present

## 2016-06-16 DIAGNOSIS — Z79899 Other long term (current) drug therapy: Secondary | ICD-10-CM | POA: Diagnosis not present

## 2016-06-16 DIAGNOSIS — I10 Essential (primary) hypertension: Secondary | ICD-10-CM | POA: Diagnosis not present

## 2016-06-16 DIAGNOSIS — N183 Chronic kidney disease, stage 3 (moderate): Secondary | ICD-10-CM | POA: Insufficient documentation

## 2016-06-16 DIAGNOSIS — Z794 Long term (current) use of insulin: Secondary | ICD-10-CM | POA: Insufficient documentation

## 2016-06-16 LAB — CBC
HEMATOCRIT: 33.4 % — AB (ref 35.0–47.0)
Hemoglobin: 10.7 g/dL — ABNORMAL LOW (ref 12.0–16.0)
MCH: 22.6 pg — AB (ref 26.0–34.0)
MCHC: 31.9 g/dL — AB (ref 32.0–36.0)
MCV: 70.8 fL — AB (ref 80.0–100.0)
Platelets: 255 10*3/uL (ref 150–440)
RBC: 4.72 MIL/uL (ref 3.80–5.20)
RDW: 16.5 % — ABNORMAL HIGH (ref 11.5–14.5)
WBC: 7.8 10*3/uL (ref 3.6–11.0)

## 2016-06-16 LAB — BASIC METABOLIC PANEL
ANION GAP: 8 (ref 5–15)
BUN: 23 mg/dL — AB (ref 6–20)
CO2: 26 mmol/L (ref 22–32)
Calcium: 9.8 mg/dL (ref 8.9–10.3)
Chloride: 103 mmol/L (ref 101–111)
Creatinine, Ser: 1.37 mg/dL — ABNORMAL HIGH (ref 0.44–1.00)
GFR calc Af Amer: 44 mL/min — ABNORMAL LOW (ref >60)
GFR calc non Af Amer: 38 mL/min — ABNORMAL LOW (ref >60)
GLUCOSE: 130 mg/dL — AB (ref 65–99)
POTASSIUM: 4.7 mmol/L (ref 3.5–5.1)
Sodium: 137 mmol/L (ref 135–145)

## 2016-06-16 LAB — TROPONIN I: Troponin I: <0.03 ng/mL (ref <0.03)

## 2016-06-16 MED ORDER — AMLODIPINE BESYLATE 5 MG PO TABS
5.0000 mg | ORAL_TABLET | Freq: Once | ORAL | Status: AC
  Administered 2016-06-16: 5 mg via ORAL
  Filled 2016-06-16: qty 1

## 2016-06-16 MED ORDER — LORAZEPAM 0.5 MG PO TABS
0.5000 mg | ORAL_TABLET | Freq: Once | ORAL | Status: AC
  Administered 2016-06-16: 0.5 mg via ORAL
  Filled 2016-06-16: qty 1

## 2016-06-16 MED ORDER — AMLODIPINE BESYLATE 2.5 MG PO TABS: 2.5000 mg | ORAL_TABLET | Freq: Every day | ORAL | 0 refills | Status: DC

## 2016-06-16 NOTE — ED Notes (Signed)

## 2016-06-16 NOTE — ED Triage Notes (Signed)
Pt. States that her sytsolic blood pressure was over 200 after checking it 3 times. She takes Losinapril and Metoprolol.

## 2016-06-16 NOTE — ED Provider Notes (Signed)
[facility] Emergency Department Provider Note    First MD Initiated Contact with Patient 06/16/16 1941     (approximate)  I have reviewed the triage vital signs and the nursing notes.   HISTORY  Chief Complaint Hypertension    HPI Hannah Kirby is a 71 y.o. female presents with chief complaint of concern over elevated blood pressure. Patient states she is feeling more anxious and paranoid throughout the day. Was recently stopped on her amlodipine. Her husband checked her blood pressure 3 times and blood pressure was 210/120. They became very concerned and brought her to the ER. She denies any chest pain or shortness of breath. No headaches. No numbness or tingling. No orthopnea. Denies any back pain. States that she has had similar episodes of panic in the past. States that they stopped her amlodipine due to low blood pressures but states that her home blood pressures have been increasing.   Past Medical History:  Diagnosis Date  . Anemia   . Arthritis   . CAD (coronary artery disease)    a. cath 02/2006: BMS x 2 to RCA, cath o/w without significant coronary disease; b. nuclear stress test 07/2014: no signs of ischemia, no ekg changes concerning for ischemia, low risk study/normal study  . Chronic bronchitis ([place])    secondary to cigarette smoking  . COPD (chronic obstructive pulmonary disease) ([place])   . Diabetes mellitus   . FHx: allergies   . GERD (gastroesophageal reflux disease)   . Goiter   . Granulomatous disease ([place])   . Hernia   . Hyperlipidemia   . Hypertension   . Kidney stone on left side 2013  . Microalbuminuria   . Obesity   . Spinal stenosis    Family History  Problem Relation Age of Onset  . Heart failure Mother   . Stroke Mother   . Diabetes Mother   . Congestive Heart Failure Mother   . Lung cancer Father   . Breast cancer Maternal Aunt    Past Surgical History:  Procedure Laterality Date  . ABDOMINAL HYSTERECTOMY      . BREAST BIOPSY    . COLONOSCOPY WITH PROPOFOL N/A 09/23/2014   Procedure: COLONOSCOPY WITH PROPOFOL;  Surgeon: Lollie Sails, MD;  Location: [facility] ENDOSCOPY;  Service: Endoscopy;  Laterality: N/A;  . CORONARY ANGIOPLASTY WITH STENT PLACEMENT  2008  . ESOPHAGOGASTRODUODENOSCOPY (EGD) WITH PROPOFOL N/A 12/30/2014   Procedure: ESOPHAGOGASTRODUODENOSCOPY (EGD) WITH PROPOFOL;  Surgeon: Lollie Sails, MD;  Location: [facility] ENDOSCOPY;  Service: Endoscopy;  Laterality: N/A;  . hysterectomy (other)     Patient Active Problem List   Diagnosis Date Noted  . Chronic kidney disease, stage 3 07/27/2015  . Chest pain at rest 05/06/2015  . 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[place]) 10/28/2014  . Lung nodule, solitary 08/20/2014  . Allergic rhinitis 07/28/2014  . CAFL (chronic airflow limitation) ([place]) 07/28/2014  . Abnormal liver enzymes 07/28/2014  . Acid reflux 07/28/2014  . Head revolving around 07/28/2014  . CAD (coronary artery disease)   . Obesity   . Granulomatous disease ([place])   . Back pain 11/12/2013  . Scoliosis 10/30/2013  . Lumbar radiculopathy 10/30/2013  . Lumbar canal stenosis 10/30/2013  . Calcium blood increased 10/22/2013  . Microalbuminuria 10/22/2013  . Smoking 03/21/2011  . Obese 03/21/2011  . Edema 08/18/2010  . Hyperlipidemia 03/25/2009  . Benign essential HTN 03/25/2009      Prior to Admission medications   Medication Sig Start Date End  Date Taking? Authorizing Provider  albuterol (VENTOLIN HFA) 108 (90 Base) MCG/ACT inhaler Inhale 2 puffs into the lungs every 4 (four) hours as needed for wheezing or shortness of breath. 11/24/15   Birdie Sons, MD  aspirin (ASPIR-81) 81 MG EC tablet Take 81 mg by mouth daily.      Historical Provider, MD  chlorhexidine (PERIDEX) 0.12 % solution Use as directed 15 mLs in the mouth or throat 2 (two) times daily. Patient taking differently: Use as directed 15 mLs in the mouth or throat as needed.  08/18/10   Minna Merritts, MD  clopidogrel (PLAVIX) 75 MG tablet Take 1 tablet (75 mg total) by mouth daily. 11/19/15   Minna Merritts, MD  ezetimibe (ZETIA) 10 MG tablet Take 1 tablet (10 mg total) by mouth daily. 06/08/16   Minna Merritts, MD  fluticasone furoate-vilanterol (BREO ELLIPTA) 100-25 MCG/INH AEPB Inhale 1 puff into the lungs daily. 06/08/16   Birdie Sons, MD  HYDROcodone-acetaminophen (NORCO/VICODIN) 5-325 MG tablet Take 1 tablet by mouth every 6 (six) hours as needed. 05/30/16   Birdie Sons, MD  insulin glargine (LANTUS) 100 UNIT/ML injection Inject 16 Units into the skin daily.     Historical Provider, MD  Liraglutide (VICTOZA) 18 MG/3ML SOLN injection Inject 1.8 mg into the skin daily.     Historical Provider, MD  lisinopril (PRINIVIL,ZESTRIL) 40 MG tablet Take 1 tablet (40 mg total) by mouth daily. 11/19/15   Minna Merritts, MD  meclizine (ANTIVERT) 25 MG tablet Take by mouth as needed.  07/06/11   Historical Provider, MD  metFORMIN (GLUCOPHAGE) 1000 MG tablet Take 1,000 mg by mouth 2 (two) times daily with a meal.    Historical Provider, MD  metoprolol tartrate (LOPRESSOR) 25 MG tablet Take 1 tablet (25 mg total) by mouth 2 (two) times daily. 11/19/15   Minna Merritts, MD  montelukast (SINGULAIR) 10 MG tablet Take 1 tablet (10 mg total) by mouth at bedtime. 04/20/16 05/20/16  Birdie Sons, MD  Multiple Vitamins-Minerals (DAILY MULTI) TABS Take 1 tablet by mouth daily.     Historical Provider, MD  nitroGLYCERIN (NITROSTAT) 0.4 MG SL tablet Place 1 tablet (0.4 mg total) under the tongue every 5 (five) minutes as needed. 05/06/15   Minna Merritts, MD  Omega-3 Fatty Acids (FISH OIL) 1000 MG CAPS Take 2 capsules by mouth 2 (two) times daily. Take 4    Historical Provider, MD  ondansetron (ZOFRAN) 4 MG tablet Take 4 mg by mouth every 8 (eight) hours as needed for nausea or vomiting.    Historical Provider, MD  RABEprazole (ACIPHEX) 20 MG tablet Take 20 mg by mouth daily.  12/30/14 12/30/15   Historical Provider, MD  rosuvastatin (CRESTOR) 5 MG tablet Take 1 tablet (5 mg total) by mouth daily. 05/19/16   Minna Merritts, MD  sucralfate (CARAFATE) 1 g tablet Take 1 g by mouth 2 (two) times daily.  12/30/14 05/11/16  Historical Provider, MD  tiotropium (SPIRIVA) 18 MCG inhalation capsule Place 1 capsule (18 mcg total) into inhaler and inhale daily. 05/30/16   Birdie Sons, MD    Allergies Sulfonamide derivatives; Codeine; and Prednisone    Social History Social History  Substance Use Topics  . Smoking status: Current Every Day Smoker    Packs/day: 1.00    Years: 40.00    Types: Cigarettes  . Smokeless tobacco: Never Used  . Alcohol use Yes     Comment: occasional  Review of Systems Patient denies headaches, rhinorrhea, blurry vision, numbness, shortness of breath, chest pain, edema, cough, abdominal pain, nausea, vomiting, diarrhea, dysuria, fevers, rashes or hallucinations unless otherwise stated above in HPI. ____________________________________________   PHYSICAL EXAM:  VITAL SIGNS: Vitals:   06/16/16 1745 06/16/16 1941  BP: (!) 180/103 (!) 190/95  Pulse: 87   Resp: 18   Temp: 98.9 F (37.2 C)     Constitutional: Alert and oriented. Well appearing and in no acute distress. Eyes: Conjunctivae are normal. PERRL. EOMI. Head: Atraumatic. Nose: No congestion/rhinnorhea. Mouth/Throat: Mucous membranes are moist.  Oropharynx non-erythematous. Neck: No stridor. Painless ROM. No cervical spine tenderness to palpation Hematological/Lymphatic/Immunilogical: No cervical lymphadenopathy. Cardiovascular: Normal rate, regular rhythm. Grossly normal heart sounds.  Good peripheral circulation. Respiratory: Normal respiratory effort.  No retractions. Lungs CTAB. Gastrointestinal: Soft and nontender. No distention. No abdominal bruits. No CVA tenderness. Genitourinary:  Musculoskeletal: No lower extremity tenderness nor edema.  No joint effusions. Neurologic:  Normal  speech and language. No gross focal neurologic deficits are appreciated. No gait instability. Skin:  Skin is warm, dry and intact. No rash noted. Psychiatric: Mood and affect are normal. Speech and behavior are normal.  ____________________________________________   LABS (all labs ordered are listed, but only abnormal results are displayed)  Results for orders placed or performed during the hospital encounter of 06/16/16 (from the past 24 hour(s))  Basic metabolic panel     Status: Abnormal   Collection Time: 06/16/16  5:52 PM  Result Value Ref Range   Sodium 137 135 - 145 mmol/L   Potassium 4.7 3.5 - 5.1 mmol/L   Chloride 103 101 - 111 mmol/L   CO2 26 22 - 32 mmol/L   Glucose, Bld 130 (H) 65 - 99 mg/dL   BUN 23 (H) 6 - 20 mg/dL   Creatinine, Ser 1.37 (H) 0.44 - 1.00 mg/dL   Calcium 9.8 8.9 - 10.3 mg/dL   GFR calc non Af Amer 38 (L) >60 mL/min   GFR calc Af Amer 44 (L) >60 mL/min   Anion gap 8 5 - 15  CBC     Status: Abnormal   Collection Time: 06/16/16  5:52 PM  Result Value Ref Range   WBC 7.8 3.6 - 11.0 K/uL   RBC 4.72 3.80 - 5.20 MIL/uL   Hemoglobin 10.7 (L) 12.0 - 16.0 g/dL   HCT 33.4 (L) 35.0 - 47.0 %   MCV 70.8 (L) 80.0 - 100.0 fL   MCH 22.6 (L) 26.0 - 34.0 pg   MCHC 31.9 (L) 32.0 - 36.0 g/dL   RDW 16.5 (H) 11.5 - 14.5 %   Platelets 255 150 - 440 K/uL  Troponin I     Status: None   Collection Time: 06/16/16  5:52 PM  Result Value Ref Range   Troponin I <0.03 <0.03 ng/mL   ____________________________________________  EKG My review and personal interpretation at Time: 17:56  Indication: htn  Rate: 85  Rhythm: sinus Axis: normal Other: no st elevations or depression ____________________________________________  RADIOLOGY  I personally reviewed all radiographic images ordered to evaluate for the above acute complaints and reviewed radiology reports and findings.  These findings were personally discussed with the patient.  Please see medical record for radiology  report.  ____________________________________________   PROCEDURES  Procedure(s) performed:  Procedures    Critical Care performed: no ____________________________________________   INITIAL IMPRESSION / ASSESSMENT AND PLAN / ED COURSE  Pertinent labs & imaging results that were available during my care of  the patient were reviewed by me and considered in my medical decision making (see chart for details).  DDX: htn urgency, med noncompliance, anxiety  Yanel Dombrosky Ruddy is a 71 y.o. who presents to the ED with concern for elevated blood pressure. She is otherwise asymptomatic. Afebrile. Denies any chest pain. No shortness of breath. Her neuro exam is intact. There is no signs or symptoms of acute end organ damage. Blood work is currently at baseline. Patient describing some component of panic attack but denies any right now. Patient given a dose of her home amlodipine. Do feel patient likely needs to be back on this medication. Also given a low-dose of Ativan for possible component of anxiety. This point I do feel patient is stable for close follow-up with her PCP for repeat blood pressure check.  Have discussed with the patient and available family all diagnostics and treatments performed thus far and all questions were answered to the best of my ability. The patient demonstrates understanding and agreement with plan.       ____________________________________________   FINAL CLINICAL IMPRESSION(S) / ED DIAGNOSES  Final diagnoses:  Hypertension, unspecified type      NEW MEDICATIONS STARTED DURING THIS VISIT:  New Prescriptions   No medications on file     Note:  This document was prepared using Dragon voice recognition software and may include unintentional dictation errors.    Merlyn Lot, MD 06/16/16 2035

## 2016-06-17 DIAGNOSIS — F17210 Nicotine dependence, cigarettes, uncomplicated: Secondary | ICD-10-CM | POA: Diagnosis not present

## 2016-06-17 DIAGNOSIS — I129 Hypertensive chronic kidney disease with stage 1 through stage 4 chronic kidney disease, or unspecified chronic kidney disease: Secondary | ICD-10-CM | POA: Diagnosis not present

## 2016-06-17 DIAGNOSIS — J449 Chronic obstructive pulmonary disease, unspecified: Secondary | ICD-10-CM | POA: Insufficient documentation

## 2016-06-17 DIAGNOSIS — Z79899 Other long term (current) drug therapy: Secondary | ICD-10-CM | POA: Diagnosis not present

## 2016-06-17 DIAGNOSIS — I2510 Atherosclerotic heart disease of native coronary artery without angina pectoris: Secondary | ICD-10-CM | POA: Insufficient documentation

## 2016-06-17 DIAGNOSIS — N183 Chronic kidney disease, stage 3 (moderate): Secondary | ICD-10-CM | POA: Diagnosis not present

## 2016-06-17 DIAGNOSIS — Z794 Long term (current) use of insulin: Secondary | ICD-10-CM | POA: Insufficient documentation

## 2016-06-17 DIAGNOSIS — E1122 Type 2 diabetes mellitus with diabetic chronic kidney disease: Secondary | ICD-10-CM | POA: Insufficient documentation

## 2016-06-17 DIAGNOSIS — I10 Essential (primary) hypertension: Secondary | ICD-10-CM | POA: Diagnosis not present

## 2016-06-17 DIAGNOSIS — Z7982 Long term (current) use of aspirin: Secondary | ICD-10-CM | POA: Insufficient documentation

## 2016-06-17 NOTE — ED Triage Notes (Addendum)
Pt presents to ED from home via POV with c/o hypertension. Pt reports being seen yesterday for same and r/x'd Amlodipine. Pt's husband reports medication taken as prescribed, but pt's BP at home was 208/122 and 209/114 and decided to come in to be re-evaluated. Pt denies any c/o SHOB, CP, or headache.

## 2016-06-18 ENCOUNTER — Emergency Department
Admission: EM | Admit: 2016-06-18 | Discharge: 2016-06-18 | Disposition: A | Discharge status: Home / Self Care | Payer: Medicare Other | Attending: Emergency Medicine | Admitting: Emergency Medicine

## 2016-06-18 DIAGNOSIS — I10 Essential (primary) hypertension: Secondary | ICD-10-CM

## 2016-06-18 DIAGNOSIS — I129 Hypertensive chronic kidney disease with stage 1 through stage 4 chronic kidney disease, or unspecified chronic kidney disease: Secondary | ICD-10-CM | POA: Diagnosis not present

## 2016-06-18 LAB — BASIC METABOLIC PANEL
ANION GAP: 8 (ref 5–15)
BUN: 25 mg/dL — ABNORMAL HIGH (ref 6–20)
CHLORIDE: 104 mmol/L (ref 101–111)
CO2: 26 mmol/L (ref 22–32)
Calcium: 9.9 mg/dL (ref 8.9–10.3)
Creatinine, Ser: 1.31 mg/dL — ABNORMAL HIGH (ref 0.44–1.00)
GFR calc non Af Amer: 41 mL/min — ABNORMAL LOW (ref >60)
GFR, EST AFRICAN AMERICAN: 47 mL/min — AB (ref >60)
GLUCOSE: 136 mg/dL — AB (ref 65–99)
Potassium: 4 mmol/L (ref 3.5–5.1)
Sodium: 138 mmol/L (ref 135–145)

## 2016-06-18 NOTE — ED Provider Notes (Signed)
[facility] Emergency Department Provider Note  ____________________________________________   First MD Initiated Contact with Patient 06/18/16 0159     (approximate)  I have reviewed the triage vital signs and the nursing notes.   HISTORY  Chief Complaint Hypertension    HPI Hannah Kirby is a 71 y.o. female with a medical history as listed below who presents for evaluation of high blood pressure.  She was seen several days ago for hypertension as well and started on amlodipine in addition to the lisinopril she already takes.  Her blood pressure is managed by Dr. [name].  After seeing Dr. Quentin Cornwall several days ago and starting 2.5 mg of amlodipine daily, she continues to check her blood pressure multiple times a day and was concerned with the numbers remain high.  Her husband brought her by private vehicle tonight for further evaluation.  She has had no symptoms other than the elevated numbers.  Although she reports the elevation of her blood pressure is severe, she denies headache, blurry vision, instability of gait, numbness or weakness in her extremities, chest pain, shortness of breath, nausea, vomiting, abdominal pain.  The onset of the elevated blood pressure is been gradual over the last several days.  Nothing in particular seems to make it better nor worse.  She has a follow-up appointment with Dr. [name] in 3 days but she was concerned that she might have a stroke.   Past Medical History:  Diagnosis Date  . Anemia   . Arthritis   . CAD (coronary artery disease)    a. cath 02/2006: BMS x 2 to RCA, cath o/w without significant coronary disease; b. nuclear stress test 07/2014: no signs of ischemia, no ekg changes concerning for ischemia, low risk study/normal study  . Chronic bronchitis ([place])    secondary to cigarette smoking  . COPD (chronic obstructive pulmonary disease) ([place])   . Diabetes mellitus   . FHx: allergies   . GERD (gastroesophageal  reflux disease)   . Goiter   . Granulomatous disease ([place])   . Hernia   . Hyperlipidemia   . Hypertension   . Kidney stone on left side 2013  . Microalbuminuria   . Obesity   . Spinal stenosis     Patient Active Problem List   Diagnosis Date Noted  . Chronic kidney disease, stage 3 07/27/2015  . Chest pain at rest 05/06/2015  . 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[place]) 10/28/2014  . Lung nodule, solitary 08/20/2014  . Allergic rhinitis 07/28/2014  . CAFL (chronic airflow limitation) ([place]) 07/28/2014  . Abnormal liver enzymes 07/28/2014  . Acid reflux 07/28/2014  . Head revolving around 07/28/2014  . CAD (coronary artery disease)   . Obesity   . Granulomatous disease ([place])   . Back pain 11/12/2013  . Scoliosis 10/30/2013  . Lumbar radiculopathy 10/30/2013  . Lumbar canal stenosis 10/30/2013  . Calcium blood increased 10/22/2013  . Microalbuminuria 10/22/2013  . Smoking 03/21/2011  . Obese 03/21/2011  . Edema 08/18/2010  . Hyperlipidemia 03/25/2009  . Benign essential HTN 03/25/2009    Past Surgical History:  Procedure Laterality Date  . ABDOMINAL HYSTERECTOMY    . BREAST BIOPSY    . COLONOSCOPY WITH PROPOFOL N/A 09/23/2014   Procedure: COLONOSCOPY WITH PROPOFOL;  Surgeon: Lollie Sails, MD;  Location: [facility] ENDOSCOPY;  Service: Endoscopy;  Laterality: N/A;  . CORONARY ANGIOPLASTY WITH STENT PLACEMENT  2008  . ESOPHAGOGASTRODUODENOSCOPY (EGD) WITH PROPOFOL N/A 12/30/2014   Procedure: ESOPHAGOGASTRODUODENOSCOPY (EGD) WITH  PROPOFOL;  Surgeon: Lollie Sails, MD;  Location: [facility] ENDOSCOPY;  Service: Endoscopy;  Laterality: N/A;  . hysterectomy (other)      Prior to Admission medications   Medication Sig Start Date End Date Taking? Authorizing Provider  albuterol (VENTOLIN HFA) 108 (90 Base) MCG/ACT inhaler Inhale 2 puffs into the lungs every 4 (four) hours as needed for wheezing or shortness of breath. 11/24/15   Birdie Sons, MD  amLODipine (NORVASC) 2.5  MG tablet Take 1 tablet (2.5 mg total) by mouth daily. 06/16/16 06/16/17  Merlyn Lot, MD  aspirin (ASPIR-81) 81 MG EC tablet Take 81 mg by mouth daily.      Historical Provider, MD  chlorhexidine (PERIDEX) 0.12 % solution Use as directed 15 mLs in the mouth or throat 2 (two) times daily. Patient taking differently: Use as directed 15 mLs in the mouth or throat as needed.  08/18/10   Minna Merritts, MD  clopidogrel (PLAVIX) 75 MG tablet Take 1 tablet (75 mg total) by mouth daily. 11/19/15   Minna Merritts, MD  ezetimibe (ZETIA) 10 MG tablet Take 1 tablet (10 mg total) by mouth daily. 06/08/16   Minna Merritts, MD  fluticasone furoate-vilanterol (BREO ELLIPTA) 100-25 MCG/INH AEPB Inhale 1 puff into the lungs daily. 06/08/16   Birdie Sons, MD  HYDROcodone-acetaminophen (NORCO/VICODIN) 5-325 MG tablet Take 1 tablet by mouth every 6 (six) hours as needed. 05/30/16   Birdie Sons, MD  insulin glargine (LANTUS) 100 UNIT/ML injection Inject 16 Units into the skin daily.     Historical Provider, MD  Liraglutide (VICTOZA) 18 MG/3ML SOLN injection Inject 1.8 mg into the skin daily.     Historical Provider, MD  lisinopril (PRINIVIL,ZESTRIL) 40 MG tablet Take 1 tablet (40 mg total) by mouth daily. 11/19/15   Minna Merritts, MD  meclizine (ANTIVERT) 25 MG tablet Take by mouth as needed.  07/06/11   Historical Provider, MD  metFORMIN (GLUCOPHAGE) 1000 MG tablet Take 1,000 mg by mouth 2 (two) times daily with a meal.    Historical Provider, MD  metoprolol tartrate (LOPRESSOR) 25 MG tablet Take 1 tablet (25 mg total) by mouth 2 (two) times daily. 11/19/15   Minna Merritts, MD  montelukast (SINGULAIR) 10 MG tablet Take 1 tablet (10 mg total) by mouth at bedtime. 04/20/16 05/20/16  Birdie Sons, MD  Multiple Vitamins-Minerals (DAILY MULTI) TABS Take 1 tablet by mouth daily.     Historical Provider, MD  nitroGLYCERIN (NITROSTAT) 0.4 MG SL tablet Place 1 tablet (0.4 mg total) under the tongue every 5 (five)  minutes as needed. 05/06/15   Minna Merritts, MD  Omega-3 Fatty Acids (FISH OIL) 1000 MG CAPS Take 2 capsules by mouth 2 (two) times daily. Take 4    Historical Provider, MD  ondansetron (ZOFRAN) 4 MG tablet Take 4 mg by mouth every 8 (eight) hours as needed for nausea or vomiting.    Historical Provider, MD  RABEprazole (ACIPHEX) 20 MG tablet Take 20 mg by mouth daily.  12/30/14 12/30/15  Historical Provider, MD  rosuvastatin (CRESTOR) 5 MG tablet Take 1 tablet (5 mg total) by mouth daily. 05/19/16   Minna Merritts, MD  sucralfate (CARAFATE) 1 g tablet Take 1 g by mouth 2 (two) times daily.  12/30/14 05/11/16  Historical Provider, MD  tiotropium (SPIRIVA) 18 MCG inhalation capsule Place 1 capsule (18 mcg total) into inhaler and inhale daily. 05/30/16   Birdie Sons, MD    Allergies  Sulfonamide derivatives; Codeine; and Prednisone  Family History  Problem Relation Age of Onset  . Heart failure Mother   . Stroke Mother   . Diabetes Mother   . Congestive Heart Failure Mother   . Lung cancer Father   . Breast cancer Maternal Aunt     Social History Social History  Substance Use Topics  . Smoking status: Current Every Day Smoker    Packs/day: 1.00    Years: 40.00    Types: Cigarettes  . Smokeless tobacco: Never Used  . Alcohol use Yes     Comment: occasional    Review of Systems Constitutional: No fever/chills.  Elevated blood pressure. Eyes: No visual changes. ENT: No sore throat. Cardiovascular: Denies chest pain. Respiratory: Denies shortness of breath. Gastrointestinal: No abdominal pain.  No nausea, no vomiting.  No diarrhea.  No constipation. Genitourinary: Negative for dysuria. Musculoskeletal: Negative for back pain. Integumentary: Negative for rash. Neurological: Negative for headaches, focal weakness or numbness.   ____________________________________________   PHYSICAL EXAM:  VITAL SIGNS: ED Triage Vitals  Enc Vitals Group     BP 06/17/16 2242 (!) 195/92       Pulse Rate 06/17/16 2242 97     Resp 06/17/16 2242 20     Temp 06/17/16 2242 98.7 F (37.1 C)     Temp Source 06/17/16 2242 Oral     SpO2 06/17/16 2242 98 %     Weight 06/17/16 2244 161 lb (73 kg)     Height 06/17/16 2244 5\' 5"  (1.651 m)     Head Circumference --      Peak Flow --      Pain Score --      Pain Loc --      Pain Edu? --      Excl. in [place]? --     Constitutional: Alert and oriented. Well appearing and in no acute distress. Eyes: Conjunctivae are normal. PERRL. EOMI. Head: Atraumatic. Nose: No congestion/rhinnorhea. Mouth/Throat: Mucous membranes are moist. Neck: No stridor.  No meningeal signs.   Cardiovascular: Normal rate, regular rhythm. Good peripheral circulation. Grossly normal heart sounds. Respiratory: Normal respiratory effort.  No retractions. Lungs CTAB. Gastrointestinal: Soft and nontender. No distention.  Musculoskeletal: No lower extremity tenderness nor edema. No gross deformities of extremities. Neurologic:  Normal speech and language. No gross focal neurologic deficits are appreciated.  Skin:  Skin is warm, dry and intact. No rash noted. Psychiatric: Mood and affect are normal. Speech and behavior are normal.  ____________________________________________   LABS (all labs ordered are listed, but only abnormal results are displayed)  Labs Reviewed  BASIC METABOLIC PANEL - Abnormal; Notable for the following:       Result Value   Glucose, Bld 136 (*)    BUN 25 (*)    Creatinine, Ser 1.31 (*)    GFR calc non Af Amer 41 (*)    GFR calc Af Amer 47 (*)    All other components within normal limits   ____________________________________________  EKG  None - EKG not ordered by ED physician ____________________________________________  RADIOLOGY   No results found.  ____________________________________________   PROCEDURES  Critical Care performed: No   Procedure(s) performed:    Procedures   ____________________________________________   INITIAL IMPRESSION / ASSESSMENT AND PLAN / ED COURSE  Pertinent labs & imaging results that were available during my care of the patient were reviewed by me and considered in my medical decision making (see chart for details).  The patient has a  symptomatic hypertension.  No evidence of any renal dysfunction or any other sign of hypertensive urgency or emergency.  Metabolic panel essentially unchanged from prior.  I had my usual and customary asymptomatic hypertension discussion with the patient and her husband and encourage close outpatient follow-up on Monday as scheduled.  I gave my usual and customary return precautions.         ____________________________________________  FINAL CLINICAL IMPRESSION(S) / ED DIAGNOSES  Final diagnoses:  Essential hypertension     MEDICATIONS GIVEN DURING THIS VISIT:  Medications - No data to display   NEW OUTPATIENT MEDICATIONS STARTED DURING THIS VISIT:  New Prescriptions   No medications on file    Modified Medications   No medications on file    Discontinued Medications   No medications on file     Note:  This document was prepared using Dragon voice recognition software and may include unintentional dictation errors.    Hinda Kehr, MD 06/18/16 [phone]

## 2016-06-18 NOTE — Progress Notes (Signed)
Cardiology Office Note  Date:  06/20/2016   ID:  [name], DOB Jul 24, 1945, MRN [id number]  PCP:  Lelon Huh, MD   Chief Complaint  Patient presents with  . other    Follow up from [facility] ER with elevated BP. Meds reviewed by the pt. verbally. Pt. c/o shortness of breath and lightheaded.     HPI:  [name] is a 71 year-old woman with a history of  diabetes,  obesity,  significantly improved with  weight loss on Victoza,  coronary artery disease with stenting of her RCA in January 2008,  long smoking history who continues to smoke,  hyperlipidemia  outbreak of her granulomatous disease in summary 2013, prednisone for this condition.  History pinched nerve and is on pain medication. who presents for routine followup of her coronary artery disease.  She was in the ER 06/17/16 with HTN Hospital records reviewed with the patient in detail Started amlodipine 2.5 daily Given ativan Per the patient  Back to the ER several days later for high blood pressure  Amlodipine increased up to 5 mg daily  she has continued to have high pressures  Husband reports she has had some anxiety recently, unclear why She reports panic attacks in January, claustrophobic in the house   No recent upper respiratory symptoms Poor diet recently, eating pastries   She is currently on albvuterol and spirivia  On victoza,   does not drink much fluids Continues to smoke one pack per day Denies having any significant chest pain on exertion  Tolerating crestor  History of GERD and Barrett's esophagus per the patient. Takes omeprazole Chronic back pain  EKG personally reviewed by myself on todays visit Shows normal sinus rhythm with rate 75 bpm no significant ST or T-wave changes   Other past medical history stress test for chest pain 07/29/2014 that showed no ischemia chronicproblems with her teeth  episode of Dehydration after diarrhea mid September 2017 Starting  having rectal bleeding Stopped her Plavix for several days with improvement of her symptoms   working with Dr. Eddie Dibbles on her diabetes.    PMH:   has a past medical history of Anemia; Arthritis; CAD (coronary artery disease); Chronic bronchitis ([place]); COPD (chronic obstructive pulmonary disease) ([place]); Diabetes mellitus; FHx: allergies; GERD (gastroesophageal reflux disease); Goiter; Granulomatous disease ([place]); Hernia; Hyperlipidemia; Hypertension; Kidney stone on left side (2013); Microalbuminuria; Obesity; and Spinal stenosis.  PSH:    Past Surgical History:  Procedure Laterality Date  . ABDOMINAL HYSTERECTOMY    . BREAST BIOPSY    . COLONOSCOPY WITH PROPOFOL N/A 09/23/2014   Procedure: COLONOSCOPY WITH PROPOFOL;  Surgeon: Lollie Sails, MD;  Location: [facility] ENDOSCOPY;  Service: Endoscopy;  Laterality: N/A;  . CORONARY ANGIOPLASTY WITH STENT PLACEMENT  2008  . ESOPHAGOGASTRODUODENOSCOPY (EGD) WITH PROPOFOL N/A 12/30/2014   Procedure: ESOPHAGOGASTRODUODENOSCOPY (EGD) WITH PROPOFOL;  Surgeon: Lollie Sails, MD;  Location: [facility] ENDOSCOPY;  Service: Endoscopy;  Laterality: N/A;  . hysterectomy (other)      Current Outpatient Prescriptions  Medication Sig Dispense Refill  . albuterol (VENTOLIN HFA) 108 (90 Base) MCG/ACT inhaler Inhale 2 puffs into the lungs every 4 (four) hours as needed for wheezing or shortness of breath. 48 g 3  . amLODipine (NORVASC) 5 MG tablet Take 5 mg by mouth daily.    Marland Kitchen aspirin (ASPIR-81) 81 MG EC tablet Take 81 mg by mouth daily.      . chlorhexidine (PERIDEX) 0.12 % solution Use as directed 15 mLs in  the mouth or throat 2 (two) times daily. (Patient taking differently: Use as directed 15 mLs in the mouth or throat as needed. ) 120 mL 6  . clopidogrel (PLAVIX) 75 MG tablet Take 1 tablet (75 mg total) by mouth daily. 90 tablet 3  . ezetimibe (ZETIA) 10 MG tablet Take 1 tablet (10 mg total) by mouth daily. 90 tablet 3  . fluticasone furoate-vilanterol (BREO  ELLIPTA) 100-25 MCG/INH AEPB Inhale 1 puff into the lungs daily. 84 each 3  . HYDROcodone-acetaminophen (NORCO/VICODIN) 5-325 MG tablet Take 1 tablet by mouth every 6 (six) hours as needed. 120 tablet 0  . insulin glargine (LANTUS) 100 UNIT/ML injection Inject 16 Units into the skin daily.     . Liraglutide (VICTOZA) 18 MG/3ML SOLN injection Inject 1.8 mg into the skin daily.     Marland Kitchen lisinopril (PRINIVIL,ZESTRIL) 40 MG tablet Take 1 tablet (40 mg total) by mouth daily. 90 tablet 3  . meclizine (ANTIVERT) 25 MG tablet Take by mouth as needed.     . metFORMIN (GLUCOPHAGE) 1000 MG tablet Take 1,000 mg by mouth 2 (two) times daily with a meal.    . metoprolol tartrate (LOPRESSOR) 25 MG tablet Take 1 tablet (25 mg total) by mouth 2 (two) times daily. 180 tablet 3  . montelukast (SINGULAIR) 10 MG tablet Take 1 tablet (10 mg total) by mouth at bedtime. 30 tablet 12  . Multiple Vitamins-Minerals (DAILY MULTI) TABS Take 1 tablet by mouth daily.     . nitroGLYCERIN (NITROSTAT) 0.4 MG SL tablet Place 1 tablet (0.4 mg total) under the tongue every 5 (five) minutes as needed. 25 tablet 6  . Omega-3 Fatty Acids (FISH OIL) 1000 MG CAPS Take 2 capsules by mouth 2 (two) times daily. Take 4    . ondansetron (ZOFRAN) 4 MG tablet Take 4 mg by mouth every 8 (eight) hours as needed for nausea or vomiting.    . RABEprazole (ACIPHEX) 20 MG tablet Take 20 mg by mouth daily.     . rosuvastatin (CRESTOR) 5 MG tablet Take 1 tablet (5 mg total) by mouth daily. 90 tablet 3  . sucralfate (CARAFATE) 1 g tablet Take 1 g by mouth 2 (two) times daily.     Marland Kitchen tiotropium (SPIRIVA) 18 MCG inhalation capsule Place 1 capsule (18 mcg total) into inhaler and inhale daily. 90 capsule 4   No current facility-administered medications for this visit.      Allergies:   Sulfonamide derivatives; Codeine; and Prednisone   Social History:  The patient  reports that she has been smoking Cigarettes.  She has a 40.00 pack-year smoking history. She  has never used smokeless tobacco. She reports that she drinks alcohol. She reports that she does not use drugs.   Family History:   family history includes Breast cancer in her maternal aunt; Congestive Heart Failure in her mother; Diabetes in her mother; Heart failure in her mother; Lung cancer in her father; Stroke in her mother.    Review of Systems: Review of Systems  Constitutional: Negative.   Respiratory: Negative.   Cardiovascular: Negative.   Gastrointestinal: Negative.   Musculoskeletal: Positive for back pain.  Neurological: Negative.   Psychiatric/Behavioral: The patient is nervous/anxious.   All other systems reviewed and are negative.    PHYSICAL EXAM: VS:  BP (!) 142/82 (BP Location: Left Arm, Patient Position: Sitting, Cuff Size: Normal)   Pulse 75   Ht 5\' 5"  (1.651 m)   Wt 156 lb 4 oz (70.9 kg)  BMI 26.00 kg/m  , BMI Body mass index is 26 kg/m. GEN: Well nourished, well developed, in no acute distress  HEENT: normal  Neck: no JVD, carotid bruits, or masses Cardiac: RRR; no murmurs, rubs, or gallops,no edema  Respiratory:  clear to auscultation bilaterally, normal work of breathing GI: soft, nontender, nondistended, + BS MS: no deformity or atrophy  Skin: warm and dry, no rash Neuro:  Strength and sensation are intact Psych: euthymic mood, full affect    Recent Labs: 05/12/2016: ALT 62 06/16/2016: Hemoglobin 10.7; Platelets 255 06/18/2016: BUN 25; Creatinine, Ser 1.31; Potassium 4.0; Sodium 138    Lipid Panel Lab Results  Component Value Date   CHOL 79 (L) 05/12/2016   HDL 28 (L) 05/12/2016   LDLCALC 17 05/12/2016   TRIG 170 (H) 05/12/2016      Wt Readings from Last 3 Encounters:  06/20/16 156 lb 4 oz (70.9 kg)  06/17/16 161 lb (73 kg)  06/16/16 161 lb (73 kg)       ASSESSMENT AND PLAN:  Anxiety  She reports prior history of panic attacks Recommended she talk with Dr. Caryn Section This is likely contributing to her spiking blood  pressure Previously did not have a problem with blood pressure, seems to cluster with her anxiety Recommended she may need a benzodiazepine. Husband may have one that she can take until she's able to see primary care. Also having problems with insomnia  Hyperlipidemia, unspecified hyperlipidemia type - Plan: EKG 12-Lead, Hepatic function panel, will wait for repeat lipid panel until changes are made to medications Was running low previously   Coronary artery disease involving native coronary artery of native heart without angina pectoris -  Currently with no symptoms of angina. No further workup at this time. Continue current medication regimen.  Benign essential HTN - Plan: EKG 12-Lead, Hepatic function panel, Lipid Profile Long discussion concerning her spiking blood pressure numbers low today 578 systolic on my check bilaterally Suspect secondary to anxiety Recommended she hold the amlodipine for now as she has not taken this this morning  Chest pain at rest - Plan: EKG 12-Lead, Hepatic function panel, Lipid Profile No significant chest pain on today's visit We have recommended regular exercise program  Smoker We have encouraged her to continue to work on weaning her cigarettes and smoking cessation. She will continue to work on this and does not want any assistance with chantix.   Chronic renal insufficiency  Likely secondary to Long-standing diabetes   Disposition:   F/U  6 months   Total encounter time more than 25 minutes  Greater than 50% was spent in counseling and coordination of care with the patient    Orders Placed This Encounter  Procedures  . EKG 12-Lead     Signed, Esmond Plants, M.D., Ph.D. 06/20/2016  [place], [place]

## 2016-06-18 NOTE — ED Notes (Signed)
Pt discharged to home.  Family member driving.  Discharge instructions reviewed.  Verbalized understanding.  No questions or concerns at this time.  Teach back verified.  Pt in NAD.  No items left in ED.   

## 2016-06-18 NOTE — Discharge Instructions (Signed)

## 2016-06-20 ENCOUNTER — Encounter: Payer: Self-pay | Admitting: Cardiovascular Disease

## 2016-06-20 ENCOUNTER — Ambulatory Visit (INDEPENDENT_AMBULATORY_CARE_PROVIDER_SITE_OTHER): Payer: Medicare Other | Admitting: Cardiovascular Disease

## 2016-06-20 VITALS — BP 142/82 | HR 75 | Ht 65.0 in | Wt 156.2 lb

## 2016-06-20 DIAGNOSIS — E0821 Diabetes mellitus due to underlying condition with diabetic nephropathy: Secondary | ICD-10-CM

## 2016-06-20 DIAGNOSIS — I10 Essential (primary) hypertension: Secondary | ICD-10-CM

## 2016-06-20 DIAGNOSIS — E782 Mixed hyperlipidemia: Secondary | ICD-10-CM

## 2016-06-20 DIAGNOSIS — I25118 Atherosclerotic heart disease of native coronary artery with other forms of angina pectoris: Secondary | ICD-10-CM | POA: Diagnosis not present

## 2016-06-20 DIAGNOSIS — F17200 Nicotine dependence, unspecified, uncomplicated: Secondary | ICD-10-CM | POA: Diagnosis not present

## 2016-06-20 DIAGNOSIS — I209 Angina pectoris, unspecified: Secondary | ICD-10-CM

## 2016-06-20 DIAGNOSIS — I2510 Atherosclerotic heart disease of native coronary artery without angina pectoris: Secondary | ICD-10-CM | POA: Diagnosis not present

## 2016-06-20 MED ORDER — AMLODIPINE BESYLATE 5 MG PO TABS: 5.0000 mg | ORAL_TABLET | Freq: Every day | ORAL | 3 refills | Status: DC

## 2016-06-20 NOTE — Patient Instructions (Addendum)
Medication Instructions:    Please take ativan/"Pam" pill, and amlodipine 5 mg as needed  Labwork:  No new labs needed  Testing/Procedures:  No further testing at this time   I recommend watching educational videos on topics of interest to you at:       [email or web address].goemmi.com  Enter code: HEARTCARE    Follow-Up: It was a pleasure seeing you in the office today. Please call us if you have new issues that need to be addressed before your next appt.  [phone]  Your physician wants you to follow-up in: 6 months.  You will receive a reminder letter in the mail two months in advance. If you don't receive a letter, please call our office to schedule the follow-up appointment.  If you need a refill on your cardiac medications before your next appointment, please call your pharmacy.

## 2016-06-21 DIAGNOSIS — E785 Hyperlipidemia, unspecified: Secondary | ICD-10-CM | POA: Diagnosis not present

## 2016-06-21 DIAGNOSIS — L920 Granuloma annulare: Secondary | ICD-10-CM | POA: Diagnosis not present

## 2016-06-21 DIAGNOSIS — E1122 Type 2 diabetes mellitus with diabetic chronic kidney disease: Secondary | ICD-10-CM | POA: Diagnosis not present

## 2016-06-21 DIAGNOSIS — N183 Chronic kidney disease, stage 3 (moderate): Secondary | ICD-10-CM | POA: Diagnosis not present

## 2016-06-21 DIAGNOSIS — Z859 Personal history of malignant neoplasm, unspecified: Secondary | ICD-10-CM | POA: Diagnosis not present

## 2016-06-21 DIAGNOSIS — Z85828 Personal history of other malignant neoplasm of skin: Secondary | ICD-10-CM | POA: Diagnosis not present

## 2016-06-21 DIAGNOSIS — E1165 Type 2 diabetes mellitus with hyperglycemia: Secondary | ICD-10-CM | POA: Diagnosis not present

## 2016-06-21 DIAGNOSIS — L821 Other seborrheic keratosis: Secondary | ICD-10-CM | POA: Diagnosis not present

## 2016-06-21 DIAGNOSIS — Z872 Personal history of diseases of the skin and subcutaneous tissue: Secondary | ICD-10-CM | POA: Diagnosis not present

## 2016-06-21 DIAGNOSIS — L570 Actinic keratosis: Secondary | ICD-10-CM | POA: Diagnosis not present

## 2016-06-21 DIAGNOSIS — E781 Pure hyperglyceridemia: Secondary | ICD-10-CM | POA: Diagnosis not present

## 2016-06-21 LAB — MICROALBUMIN, URINE: MICROALB UR: 127

## 2016-06-28 ENCOUNTER — Telehealth: Payer: Self-pay | Admitting: Family Medicine

## 2016-06-28 DIAGNOSIS — E781 Pure hyperglyceridemia: Secondary | ICD-10-CM | POA: Diagnosis not present

## 2016-06-28 DIAGNOSIS — E1121 Type 2 diabetes mellitus with diabetic nephropathy: Secondary | ICD-10-CM | POA: Diagnosis not present

## 2016-06-28 DIAGNOSIS — Z8639 Personal history of other endocrine, nutritional and metabolic disease: Secondary | ICD-10-CM | POA: Diagnosis not present

## 2016-06-28 DIAGNOSIS — I129 Hypertensive chronic kidney disease with stage 1 through stage 4 chronic kidney disease, or unspecified chronic kidney disease: Secondary | ICD-10-CM | POA: Diagnosis not present

## 2016-06-28 DIAGNOSIS — E785 Hyperlipidemia, unspecified: Secondary | ICD-10-CM | POA: Diagnosis not present

## 2016-06-28 DIAGNOSIS — Z720 Tobacco use: Secondary | ICD-10-CM | POA: Diagnosis not present

## 2016-06-28 DIAGNOSIS — R809 Proteinuria, unspecified: Secondary | ICD-10-CM | POA: Diagnosis not present

## 2016-06-28 DIAGNOSIS — E1122 Type 2 diabetes mellitus with diabetic chronic kidney disease: Secondary | ICD-10-CM | POA: Diagnosis not present

## 2016-06-28 DIAGNOSIS — N183 Chronic kidney disease, stage 3 (moderate): Secondary | ICD-10-CM | POA: Diagnosis not present

## 2016-06-28 DIAGNOSIS — Z794 Long term (current) use of insulin: Secondary | ICD-10-CM | POA: Diagnosis not present

## 2016-06-28 NOTE — Telephone Encounter (Signed)
Called Pt to schedule AWV with [facility] - knb °

## 2016-06-29 DIAGNOSIS — H2513 Age-related nuclear cataract, bilateral: Secondary | ICD-10-CM | POA: Diagnosis not present

## 2016-06-29 LAB — HM DIABETES EYE EXAM

## 2016-06-30 ENCOUNTER — Encounter: Payer: Self-pay | Admitting: *Deleted

## 2016-07-11 ENCOUNTER — Telehealth: Payer: Self-pay | Admitting: Cardiovascular Disease

## 2016-07-11 NOTE — Telephone Encounter (Signed)
Received cardiac clearance request for pt to proceed w/ upper endoscopy on 07/19/16 w/ Dr. Gustavo Lah. She will be receiving propofol for sedation.  Please route clearance to [facility], Attn: Tabitha/April @ [phone].

## 2016-07-11 NOTE — Telephone Encounter (Signed)
Acceptable risk for procedure No further testing needed 

## 2016-07-11 NOTE — Telephone Encounter (Signed)
Routed to fax # provided. 

## 2016-07-14 NOTE — Telephone Encounter (Signed)
Patient was told by [facility] this clearance was not received. Please send again

## 2016-07-14 NOTE — Telephone Encounter (Signed)
Spoke with patient and she states that they do not have the clearance forms yet. Let her know that I would route through Epic and to check with them tomorrow to make sure they received them. She was appreciative for the call and had no further questions at this time.

## 2016-07-15 ENCOUNTER — Encounter: Payer: Self-pay | Admitting: *Deleted

## 2016-07-19 ENCOUNTER — Ambulatory Visit: Payer: Medicare Other | Admitting: Anesthesiology

## 2016-07-19 ENCOUNTER — Encounter
Admission: RE | Disposition: A | Discharge status: Home / Self Care | Payer: Self-pay | Source: Ambulatory Visit | Attending: Gastroenterology

## 2016-07-19 ENCOUNTER — Ambulatory Visit
Admission: RE | Admit: 2016-07-19 | Discharge: 2016-07-19 | Disposition: A | Discharge status: Home / Self Care | Payer: Medicare Other | Source: Ambulatory Visit | Attending: Gastroenterology | Admitting: Gastroenterology

## 2016-07-19 ENCOUNTER — Encounter: Payer: Self-pay | Admitting: *Deleted

## 2016-07-19 DIAGNOSIS — K319 Disease of stomach and duodenum, unspecified: Secondary | ICD-10-CM | POA: Diagnosis not present

## 2016-07-19 DIAGNOSIS — B3781 Candidal esophagitis: Secondary | ICD-10-CM | POA: Insufficient documentation

## 2016-07-19 DIAGNOSIS — Z794 Long term (current) use of insulin: Secondary | ICD-10-CM | POA: Insufficient documentation

## 2016-07-19 DIAGNOSIS — K2970 Gastritis, unspecified, without bleeding: Secondary | ICD-10-CM | POA: Diagnosis not present

## 2016-07-19 DIAGNOSIS — Z7982 Long term (current) use of aspirin: Secondary | ICD-10-CM | POA: Insufficient documentation

## 2016-07-19 DIAGNOSIS — K2950 Unspecified chronic gastritis without bleeding: Secondary | ICD-10-CM | POA: Insufficient documentation

## 2016-07-19 DIAGNOSIS — K2960 Other gastritis without bleeding: Secondary | ICD-10-CM | POA: Diagnosis not present

## 2016-07-19 DIAGNOSIS — Z79899 Other long term (current) drug therapy: Secondary | ICD-10-CM | POA: Insufficient documentation

## 2016-07-19 DIAGNOSIS — E119 Type 2 diabetes mellitus without complications: Secondary | ICD-10-CM | POA: Diagnosis not present

## 2016-07-19 DIAGNOSIS — J449 Chronic obstructive pulmonary disease, unspecified: Secondary | ICD-10-CM | POA: Insufficient documentation

## 2016-07-19 DIAGNOSIS — I10 Essential (primary) hypertension: Secondary | ICD-10-CM | POA: Insufficient documentation

## 2016-07-19 DIAGNOSIS — Z7902 Long term (current) use of antithrombotics/antiplatelets: Secondary | ICD-10-CM | POA: Insufficient documentation

## 2016-07-19 DIAGNOSIS — K210 Gastro-esophageal reflux disease with esophagitis: Secondary | ICD-10-CM | POA: Insufficient documentation

## 2016-07-19 DIAGNOSIS — F17200 Nicotine dependence, unspecified, uncomplicated: Secondary | ICD-10-CM | POA: Insufficient documentation

## 2016-07-19 DIAGNOSIS — K2270 Barrett's esophagus without dysplasia: Secondary | ICD-10-CM | POA: Insufficient documentation

## 2016-07-19 DIAGNOSIS — K449 Diaphragmatic hernia without obstruction or gangrene: Secondary | ICD-10-CM | POA: Diagnosis not present

## 2016-07-19 DIAGNOSIS — I2510 Atherosclerotic heart disease of native coronary artery without angina pectoris: Secondary | ICD-10-CM | POA: Insufficient documentation

## 2016-07-19 DIAGNOSIS — K3189 Other diseases of stomach and duodenum: Secondary | ICD-10-CM | POA: Diagnosis not present

## 2016-07-19 HISTORY — PX: ESOPHAGOGASTRODUODENOSCOPY (EGD) WITH PROPOFOL: SHX5813

## 2016-07-19 LAB — KOH PREP

## 2016-07-19 LAB — GLUCOSE, CAPILLARY: Glucose-Capillary: 91 mg/dL (ref 65–99)

## 2016-07-19 SURGERY — ESOPHAGOGASTRODUODENOSCOPY (EGD) WITH PROPOFOL
Anesthesia: General

## 2016-07-19 MED ORDER — FENTANYL CITRATE (PF) 100 MCG/2ML IJ SOLN
INTRAMUSCULAR | Status: DC | PRN
  Administered 2016-07-19: 100 ug via INTRAVENOUS

## 2016-07-19 MED ORDER — PROPOFOL 500 MG/50ML IV EMUL
INTRAVENOUS | Status: AC
  Filled 2016-07-19: qty 50

## 2016-07-19 MED ORDER — SODIUM CHLORIDE 0.9 % IV SOLN
INTRAVENOUS | Status: DC
  Administered 2016-07-19: 07:00:00 via INTRAVENOUS

## 2016-07-19 MED ORDER — FENTANYL CITRATE (PF) 100 MCG/2ML IJ SOLN
INTRAMUSCULAR | Status: AC
  Filled 2016-07-19: qty 2

## 2016-07-19 MED ORDER — PROPOFOL 10 MG/ML IV BOLUS
INTRAVENOUS | Status: DC | PRN
  Administered 2016-07-19 (×2): 20 mg via INTRAVENOUS

## 2016-07-19 MED ORDER — LIDOCAINE HCL 2 % IJ SOLN
INTRAMUSCULAR | Status: AC
  Filled 2016-07-19: qty 10

## 2016-07-19 MED ORDER — LIDOCAINE HCL (CARDIAC) 20 MG/ML IV SOLN
INTRAVENOUS | Status: DC | PRN
  Administered 2016-07-19: 2 mL via INTRAVENOUS

## 2016-07-19 MED ORDER — SODIUM CHLORIDE 0.9 % IV SOLN: INTRAVENOUS | Status: DC

## 2016-07-19 MED ORDER — PROPOFOL 500 MG/50ML IV EMUL
INTRAVENOUS | Status: DC | PRN
  Administered 2016-07-19: 120 ug/kg/min via INTRAVENOUS

## 2016-07-19 NOTE — H&P (Signed)
Outpatient short stay form Pre-procedure 07/19/2016 7:32 AM Hannah Sails MD  Primary Physician: Dr. Tanda Rockers, Dr. Lelon Huh  Reason for visit: EGD  History of present illness:  Patient is a 71 year old female presenting today as above. She has personal history of Barrett's esophagus. She has been on rabeprazole 20 mg a day and Carafate 1 g twice a day in regards to history of bile gastritis. She will occasionally use Tums or Gaviscon for breakthrough symptoms. She has no dysphagia and rare heartburn. She states she is feeling much better on the current regimen and she was. She does take Plavix and aspirin but has held his Plavix for 6 days and aspirin for 3 days.    Current Facility-Administered Medications:  .  0.9 %  sodium chloride infusion, , Intravenous, Continuous, Hannah Sails, MD, Last Rate: 20 mL/hr at 07/19/16 0715 .  0.9 %  sodium chloride infusion, , Intravenous, Continuous, Hannah Sails, MD  Prescriptions Prior to Admission  Medication Sig Dispense Refill Last Dose  . albuterol (VENTOLIN HFA) 108 (90 Base) MCG/ACT inhaler Inhale 2 puffs into the lungs every 4 (four) hours as needed for wheezing or shortness of breath. 48 g 3 07/18/2016 at Unknown time  . amLODipine (NORVASC) 5 MG tablet Take 1 tablet (5 mg total) by mouth daily. 90 tablet 3 07/18/2016 at Unknown time  . aspirin (ASPIR-81) 81 MG EC tablet Take 81 mg by mouth daily.     Past Week at Unknown time  . clopidogrel (PLAVIX) 75 MG tablet Take 1 tablet (75 mg total) by mouth daily. 90 tablet 3 Past Week at Unknown time  . ezetimibe (ZETIA) 10 MG tablet Take 1 tablet (10 mg total) by mouth daily. 90 tablet 3 07/18/2016 at Unknown time  . HYDROcodone-acetaminophen (NORCO/VICODIN) 5-325 MG tablet Take 1 tablet by mouth every 6 (six) hours as needed. 120 tablet 0 07/18/2016 at Unknown time  . insulin glargine (LANTUS) 100 UNIT/ML injection Inject 16 Units into the skin daily.    07/18/2016 at Unknown time   . Liraglutide (VICTOZA) 18 MG/3ML SOLN injection Inject 1.8 mg into the skin daily.    07/18/2016 at Unknown time  . lisinopril (PRINIVIL,ZESTRIL) 40 MG tablet Take 1 tablet (40 mg total) by mouth daily. 90 tablet 3 07/19/2016 at Unknown time  . meclizine (ANTIVERT) 25 MG tablet Take by mouth as needed.    Past Week at Unknown time  . metFORMIN (GLUCOPHAGE) 1000 MG tablet Take 1,000 mg by mouth 2 (two) times daily with a meal.   07/18/2016 at Unknown time  . metoprolol tartrate (LOPRESSOR) 25 MG tablet Take 1 tablet (25 mg total) by mouth 2 (two) times daily. 180 tablet 3 07/19/2016 at Unknown time  . Multiple Vitamins-Minerals (DAILY MULTI) TABS Take 1 tablet by mouth daily.    Past Week at Unknown time  . Omega-3 Fatty Acids (FISH OIL) 1000 MG CAPS Take 2 capsules by mouth 2 (two) times daily. Take 4   Past Week at Unknown time  . ondansetron (ZOFRAN) 4 MG tablet Take 4 mg by mouth every 8 (eight) hours as needed for nausea or vomiting.   Past Month at Unknown time  . rosuvastatin (CRESTOR) 5 MG tablet Take 1 tablet (5 mg total) by mouth daily. 90 tablet 3 07/18/2016 at Unknown time  . tiotropium (SPIRIVA) 18 MCG inhalation capsule Place 1 capsule (18 mcg total) into inhaler and inhale daily. 90 capsule 4 07/18/2016 at Unknown time  . chlorhexidine (  PERIDEX) 0.12 % solution Use as directed 15 mLs in the mouth or throat 2 (two) times daily. (Patient taking differently: Use as directed 15 mLs in the mouth or throat as needed. ) 120 mL 6 Taking  . fluticasone furoate-vilanterol (BREO ELLIPTA) 100-25 MCG/INH AEPB Inhale 1 puff into the lungs daily. 84 each 3 Taking  . montelukast (SINGULAIR) 10 MG tablet Take 1 tablet (10 mg total) by mouth at bedtime. 30 tablet 12 Taking  . nitroGLYCERIN (NITROSTAT) 0.4 MG SL tablet Place 1 tablet (0.4 mg total) under the tongue every 5 (five) minutes as needed. 25 tablet 6 Taking  . RABEprazole (ACIPHEX) 20 MG tablet Take 20 mg by mouth daily.    Taking  . sucralfate  (CARAFATE) 1 g tablet Take 1 g by mouth 2 (two) times daily.    Taking     Allergies  Allergen Reactions  . Sulfonamide Derivatives Other (See Comments)    Last taken as a child; made her mouth break out  . Codeine Nausea And Vomiting  . Prednisone Palpitations and Other (See Comments)    Made her legs "feel weird."     Past Medical History:  Diagnosis Date  . Anemia   . Arthritis   . CAD (coronary artery disease)    a. cath 02/2006: BMS x 2 to RCA, cath o/w without significant coronary disease; b. nuclear stress test 07/2014: no signs of ischemia, no ekg changes concerning for ischemia, low risk study/normal study  . Chronic bronchitis ([place])    secondary to cigarette smoking  . COPD (chronic obstructive pulmonary disease) ([place])   . Diabetes mellitus   . FHx: allergies   . GERD (gastroesophageal reflux disease)   . Goiter   . Granulomatous disease ([place])   . Hernia   . Hyperlipidemia   . Hypertension   . Kidney stone on left side 2013  . Microalbuminuria   . Obesity   . Spinal stenosis     Review of systems:      Physical Exam    Heart and lungs: Regular rate and rhythm without rub or gallop, lungs are bilaterally clear.    HEENT: Normocephalic atraumatic eyes are anicteric    Other:     Pertinant exam for procedure: Soft nontender nondistended bowel sounds positive normoactive    Planned proceedures: EGD and indicated procedures. I have discussed the risks benefits and complications of procedures to include not limited to bleeding, infection, perforation and the risk of sedation and the patient wishes to proceed.    Hannah Sails, MD Gastroenterology 07/19/2016  7:32 AM

## 2016-07-19 NOTE — Anesthesia Post-op Follow-up Note (Signed)
Anesthesia QCDR form completed.        

## 2016-07-19 NOTE — Anesthesia Postprocedure Evaluation (Signed)
Anesthesia Post Note  Patient: Hannah Kirby  Procedure(s) Performed: Procedure(s) (LRB): ESOPHAGOGASTRODUODENOSCOPY (EGD) WITH PROPOFOL (N/A)  Patient location during evaluation: PACU Anesthesia Type: General Level of consciousness: awake Pain management: pain level controlled Vital Signs Assessment: post-procedure vital signs reviewed and stable Respiratory status: spontaneous breathing Cardiovascular status: stable Anesthetic complications: no     Last Vitals:  Vitals:   07/19/16 0702 07/19/16 0800  BP: (!) 159/83 (!) 156/80  Pulse: 78 69  Resp: 16 15  Temp: 36.2 C 36.5 C    Last Pain:  Vitals:   07/19/16 0800  TempSrc: Tympanic                 VAN STAVEREN,Hannah Kirby

## 2016-07-19 NOTE — Anesthesia Preprocedure Evaluation (Signed)
Anesthesia Evaluation  Patient identified by MRN, date of birth, ID band Patient awake    Reviewed: Allergy & Precautions, NPO status , Patient's Chart, lab work & pertinent test results  Airway Mallampati: III       Dental  (+) Teeth Intact   Pulmonary COPD,  COPD inhaler, Current Smoker,     + decreased breath sounds      Cardiovascular Exercise Tolerance: Good hypertension, Pt. on medications and Pt. on home beta blockers + CAD   Rhythm:Regular     Neuro/Psych    GI/Hepatic GERD  Medicated,  Endo/Other  diabetes  Renal/GU CRFRenal disease     Musculoskeletal   Abdominal Normal abdominal exam  (+)   Peds  Hematology  (+) anemia ,   Anesthesia Other Findings   Reproductive/Obstetrics                             Anesthesia Physical Anesthesia Plan  ASA: III  Anesthesia Plan: General   Post-op Pain Management:    Induction: Intravenous  Airway Management Planned: Natural Airway and Nasal Cannula  Additional Equipment:   Intra-op Plan:   Post-operative Plan:   Informed Consent: I have reviewed the patients History and Physical, chart, labs and discussed the procedure including the risks, benefits and alternatives for the proposed anesthesia with the patient or authorized representative who has indicated his/her understanding and acceptance.     Plan Discussed with: Surgeon  Anesthesia Plan Comments:         Anesthesia Quick Evaluation

## 2016-07-19 NOTE — Op Note (Signed)
[facility] Gastroenterology Patient Name: Hannah Kirby Procedure Date: 07/19/2016 7:41 AM MRN: [id number] Account #: [id number] Date of Birth: 08-Jan-1946 Admit Type: Outpatient Age: 72 Room: [facility] ENDO ROOM 3 Gender: Female Note Status: Finalized Procedure:            Upper GI endoscopy Indications:          Follow-up of Barrett's esophagus Providers:            Lollie Sails, MD Referring MD:         Kirstie Peri. Caryn Section, MD (Referring MD) Medicines:            Monitored Anesthesia Care Complications:        No immediate complications. Procedure:            Pre-Anesthesia Assessment:                       - ASA Grade Assessment: III - A patient with severe                        systemic disease.                       After obtaining informed consent, the endoscope was                        passed under direct vision. Throughout the procedure,                        the patient's blood pressure, pulse, and oxygen                        saturations were monitored continuously. The Endoscope                        was introduced through the mouth, and advanced to the                        third part of duodenum. The upper GI endoscopy was                        accomplished without difficulty. The patient tolerated                        the procedure well. Findings:      Patchy candidiasis was found in the middle third of the esophagus and in       the lower third of the esophagus. Cells for cytology were obtained by       brushing.      The Z-line was variable.      A small hiatal hernia was found. The Z-line was a variable distance from       incisors; the hiatal hernia was sliding.      Striped mildly erythematous mucosa without bleeding was found in the       gastric antrum. Biopsies were taken with a cold forceps for histology.      The cardia and gastric fundus were normal on retroflexion otherwise.      The in the duodenum was normal. Impression:            - Monilial esophagitis. Cells for cytology obtained.                       -  Z-line variable.                       - Small hiatal hernia.                       - Erythematous mucosa in the antrum. Biopsied.                       - Normal. Recommendation:       - Discharge patient to home.                       - Continue present medications.                       - Telephone GI clinic for pathology results in 1 week.                       - Mycelex (clotrimazole) 10 mg lozenge 5x/day for 10                        days. Procedure Code(s):    --- Professional ---                       [phone], Esophagogastroduodenoscopy, flexible, transoral;                        with biopsy, single or multiple Diagnosis Code(s):    --- Professional ---                       B37.81, Candidal esophagitis                       K22.8, Other specified diseases of esophagus                       K44.9, Diaphragmatic hernia without obstruction or                        gangrene                       K31.89, Other diseases of stomach and duodenum                       K22.70, Barrett's esophagus without dysplasia CPT copyright 2016 American Medical Association. All rights reserved. The codes documented in this report are preliminary and upon coder review may  be revised to meet current compliance requirements. Lollie Sails, MD 07/19/2016 8:09:49 AM This report has been signed electronically. Number of Addenda: 0 Note Initiated On: 07/19/2016 7:41 AM      [facility]

## 2016-07-19 NOTE — Transfer of Care (Signed)
Immediate Anesthesia Transfer of Care Note  Patient: Verdia Bolt Haris  Procedure(s) Performed: Procedure(s): ESOPHAGOGASTRODUODENOSCOPY (EGD) WITH PROPOFOL (N/A)  Patient Location: PACU  Anesthesia Type:General  Level of Consciousness: awake  Airway & Oxygen Therapy: Patient Spontanous Breathing and Patient connected to nasal cannula oxygen  Post-op Assessment: Report given to RN and Post -op Vital signs reviewed and stable  Post vital signs: Reviewed  Last Vitals:  Vitals:   07/19/16 0702  BP: (!) 159/83  Pulse: 78  Resp: 16  Temp: 36.2 C    Last Pain:  Vitals:   07/19/16 0702  TempSrc: Tympanic      Patients Stated Pain Goal: 5 (43/83/81 8403)  Complications: No apparent anesthesia complications

## 2016-07-20 ENCOUNTER — Encounter: Payer: Self-pay | Admitting: Gastroenterology

## 2016-07-20 LAB — SURGICAL PATHOLOGY

## 2016-07-21 ENCOUNTER — Encounter: Payer: Self-pay | Admitting: Family Medicine

## 2016-07-21 DIAGNOSIS — K2270 Barrett's esophagus without dysplasia: Secondary | ICD-10-CM | POA: Insufficient documentation

## 2016-08-03 ENCOUNTER — Telehealth: Payer: Self-pay | Admitting: Family Medicine

## 2016-08-03 ENCOUNTER — Ambulatory Visit (INDEPENDENT_AMBULATORY_CARE_PROVIDER_SITE_OTHER): Payer: Medicare Other

## 2016-08-03 VITALS — BP 142/82 | HR 76 | Temp 99.0°F | Ht 65.0 in | Wt 160.0 lb

## 2016-08-03 DIAGNOSIS — Z0000 Encounter for general adult medical examination without abnormal findings: Secondary | ICD-10-CM | POA: Diagnosis not present

## 2016-08-03 DIAGNOSIS — R911 Solitary pulmonary nodule: Secondary | ICD-10-CM

## 2016-08-03 NOTE — Telephone Encounter (Signed)
-----   Message from Birdie Sons, MD sent at 05/31/2016  8:40 AM EDT ----- Regarding: FW: due follow up chest CT June 2018   ----- Message ----- From: Birdie Sons, MD Sent: 05/30/2016  11:15 AM To: Birdie Sons, MD Subject: due follow up chest CT June 2018

## 2016-08-03 NOTE — Progress Notes (Signed)
Subjective:   Hannah Kirby is a 71 y.o. female who presents for Medicare Annual (Subsequent) preventive examination.  Review of Systems:  N/A  Cardiac Risk Factors include: advanced age (>1men, >26 women);diabetes mellitus;dyslipidemia;hypertension;smoking/ tobacco exposure     Objective:     Vitals: BP (!) 142/82 (BP Location: Right Arm)   Pulse 76   Temp 99 F (37.2 C) (Oral)   Ht 5\' 5"  (1.651 m)   Wt 160 lb (72.6 kg)   BMI 26.63 kg/m   Body mass index is 26.63 kg/m.   Tobacco History  Smoking Status  . Current Every Day Smoker  . Packs/day: 1.00  . Years: 40.00  . Types: Cigarettes  Smokeless Tobacco  . Never Used     Ready to quit: No Counseling given: Not Answered   Past Medical History:  Diagnosis Date  . Anemia   . Arthritis   . CAD (coronary artery disease)    a. cath 02/2006: BMS x 2 to RCA, cath o/w without significant coronary disease; b. nuclear stress test 07/2014: no signs of ischemia, no ekg changes concerning for ischemia, low risk study/normal study  . Chronic bronchitis ([place])    secondary to cigarette smoking  . COPD (chronic obstructive pulmonary disease) ([place])   . Diabetes mellitus   . FHx: allergies   . GERD (gastroesophageal reflux disease)   . Goiter   . 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 Hernia   . Hyperlipidemia   . Hypertension   . Kidney stone on left side 2013  . Microalbuminuria   . Obesity   . Spinal stenosis    Past Surgical History:  Procedure Laterality Date  . ABDOMINAL HYSTERECTOMY    . BREAST BIOPSY    . COLONOSCOPY WITH PROPOFOL N/A 09/23/2014   Procedure: COLONOSCOPY WITH PROPOFOL;  Surgeon: Lollie Sails, MD;  Location: [facility] ENDOSCOPY;  Service: Endoscopy;  Laterality: N/A;  . CORONARY ANGIOPLASTY WITH STENT PLACEMENT  2008  . ESOPHAGOGASTRODUODENOSCOPY (EGD) WITH PROPOFOL N/A 12/30/2014   Procedure: ESOPHAGOGASTRODUODENOSCOPY (EGD) WITH PROPOFOL;  Surgeon: Lollie Sails, MD;  Location: [facility] ENDOSCOPY;   Service: Endoscopy;  Laterality: N/A;  . ESOPHAGOGASTRODUODENOSCOPY (EGD) WITH PROPOFOL N/A 07/19/2016   Procedure: ESOPHAGOGASTRODUODENOSCOPY (EGD) WITH PROPOFOL;  Surgeon: Lollie Sails, MD;  Location[facility] ENDOSCOPY;  Service: Endoscopy;  Laterality: N/A;  . hysterectomy (other)     Family History  Problem Relation Age of Onset  . Heart failure Mother   . Stroke Mother   . Diabetes Mother   . Congestive Heart Failure Mother   . Lung cancer Father   . Breast cancer Maternal Aunt    History  Sexual Activity  . Sexual activity: Not on file    Outpatient Encounter Prescriptions as of 08/03/2016  Medication Sig  . albuterol (VENTOLIN HFA) 108 (90 Base) MCG/ACT inhaler Inhale 2 puffs into the lungs every 4 (four) hours as needed for wheezing or shortness of breath.  Marland Kitchen amLODipine (NORVASC) 5 MG tablet Take 1 tablet (5 mg total) by mouth daily.  Marland Kitchen aspirin (ASPIR-81) 81 MG EC tablet Take 81 mg by mouth daily.    . chlorhexidine (PERIDEX) 0.12 % solution Use as directed 15 mLs in the mouth or throat 2 (two) times daily. (Patient taking differently: Use as directed 15 mLs in the mouth or throat as needed. )  . clopidogrel (PLAVIX) 75 MG tablet Take 1 tablet (75 mg total) by mouth daily.  Marland Kitchen ezetimibe (ZETIA) 10 MG tablet Take 1 tablet (  10 mg total) by mouth daily.  . fluticasone furoate-vilanterol (BREO ELLIPTA) 100-25 MCG/INH AEPB Inhale 1 puff into the lungs daily.  Marland Kitchen HYDROcodone-acetaminophen (NORCO/VICODIN) 5-325 MG tablet Take 1 tablet by mouth every 6 (six) hours as needed.  . insulin glargine (LANTUS) 100 UNIT/ML injection Inject 12 Units into the skin daily.   . Liraglutide (VICTOZA) 18 MG/3ML SOLN injection Inject 1.8 mg into the skin daily.   Marland Kitchen lisinopril (PRINIVIL,ZESTRIL) 40 MG tablet Take 1 tablet (40 mg total) by mouth daily.  . meclizine (ANTIVERT) 25 MG tablet Take by mouth as needed.   . metFORMIN (GLUCOPHAGE) 1000 MG tablet Take 1,000 mg by mouth 2 (two) times daily with  a meal.  . metoprolol tartrate (LOPRESSOR) 25 MG tablet Take 1 tablet (25 mg total) by mouth 2 (two) times daily.  . montelukast (SINGULAIR) 10 MG tablet Take 1 tablet (10 mg total) by mouth at bedtime. (Patient taking differently: Take 10 mg by mouth at bedtime. )  . Multiple Vitamins-Minerals (DAILY MULTI) TABS Take 1 tablet by mouth daily.   . nitroGLYCERIN (NITROSTAT) 0.4 MG SL tablet Place 1 tablet (0.4 mg total) under the tongue every 5 (five) minutes as needed.  . Omega-3 Fatty Acids (FISH OIL) 1000 MG CAPS Take 2 capsules by mouth 2 (two) times daily. Take 4  . ondansetron (ZOFRAN) 4 MG tablet Take 4 mg by mouth every 8 (eight) hours as needed for nausea or vomiting.  . RABEprazole (ACIPHEX) 20 MG tablet Take 20 mg by mouth daily.   . rosuvastatin (CRESTOR) 5 MG tablet Take 1 tablet (5 mg total) by mouth daily.  . sucralfate (CARAFATE) 1 g tablet Take 1 g by mouth 2 (two) times daily.   Marland Kitchen tiotropium (SPIRIVA) 18 MCG inhalation capsule Place 1 capsule (18 mcg total) into inhaler and inhale daily.   No facility-administered encounter medications on file as of 08/03/2016.     Activities of Daily Living In your present state of health, do you have any difficulty performing the following activities: 08/03/2016  Hearing? N  Vision? Y  Difficulty concentrating or making decisions? N  Walking or climbing stairs? N  Dressing or bathing? N  Doing errands, shopping? N  Preparing Food and eating ? N  Using the Toilet? N  In the past six months, have you accidently leaked urine? Y  Do you have problems with loss of bowel control? N  Managing your Medications? N  Managing your Finances? N  Housekeeping or managing your Housekeeping? N  Some recent data might be hidden    Patient Care Team: Birdie Sons, MD as PCP - General (Family Medicine) Elease Etienne, MD (Endocrinology) Minna Merritts, MD as Consulting Physician (Cardiology) Dingeldein, Remo Lipps, MD as Consulting Physician  (Ophthalmology) Lollie Sails, MD as Consulting Physician (Gastroenterology)    Assessment:     Exercise Activities and Dietary recommendations Current Exercise Habits: Home exercise routine, Type of exercise: walking, Time (Minutes): 25, Frequency (Times/Week): 2, Weekly Exercise (Minutes/Week): 50, Intensity: Mild, Exercise limited by: None identified  Goals    . Increase water intake    . Increase water intake          Recommend increasing water intake to 4-6 glasses a day.      Fall Risk Fall Risk  08/03/2016 07/27/2015 07/28/2014  Falls in the past year? No No No   Depression Screen PHQ 2/9 Scores 08/03/2016 08/03/2016 07/27/2015 07/28/2014  PHQ - 2 Score 0 0 0 0  PHQ- 9 Score 0 - - -     Cognitive Function     6CIT Screen 08/03/2016  What Year? 0 points  What month? 0 points  What time? 0 points  Count back from 20 0 points  Months in reverse 0 points  Repeat phrase 0 points  Total Score 0    Immunization History  Administered Date(s) Administered  . Influenza, High Dose Seasonal PF 11/01/2014, 11/24/2015  . Influenza-Unspecified 10/22/2013  . Pneumococcal Conjugate-13 05/22/2013  . Pneumococcal Polysaccharide-23 11/02/2011  . Zoster 04/23/2012   Screening Tests Health Maintenance  Topic Date Due  . FOOT EXAM  07/12/1956  . TETANUS/TDAP  07/12/1965  . HEMOGLOBIN A1C  09/19/2016  . INFLUENZA VACCINE  09/21/2016  . MAMMOGRAM  11/24/2016  . OPHTHALMOLOGY EXAM  06/29/2017  . COLONOSCOPY  09/22/2024  . DEXA SCAN  Completed  . Hepatitis C Screening  Completed  . PNA vac Low Risk Adult  Completed      Plan:  I have personally reviewed and addressed the Medicare Annual Wellness questionnaire and have noted the following in the patient's chart:  A. Medical and social history B. Use of alcohol, tobacco or illicit drugs  C. Current medications and supplements D. Functional ability and status E.  Nutritional status F.  Physical activity G. Advance  directives H. List of other physicians I.  Hospitalizations, surgeries, and ER visits in previous 12 months J.  [place] such as hearing and vision if needed, cognitive and depression L. Referrals and appointments - none  In addition, I have reviewed and discussed with patient certain preventive protocols, quality metrics, and best practice recommendations. A written personalized care plan for preventive services as well as general preventive health recommendations were provided to patient.  See attached scanned questionnaire for additional information.   Signed,  Fabio Neighbors, LPN Nurse Health Advisor   MD Recommendations: None, pt declined tetanus vaccine today.

## 2016-08-03 NOTE — Telephone Encounter (Signed)
Please advise patient is due for follow up CT chest without contrast. due to lung nodule seen on ct last year. Please send order to [facility]ah once patient is advised.

## 2016-08-03 NOTE — Patient Instructions (Signed)
Hannah Kirby , Thank you for taking time to come for your Medicare Wellness Visit. I appreciate your ongoing commitment to your health goals. Please review the following plan we discussed and let me know if I can assist you in the future.   Screening recommendations/referrals: Colonoscopy: completed 09/23/14, due 09/2024 Mammogram: completed 11/25/14, due now Bone Density: completed 01/07/09 Recommended yearly ophthalmology/optometry visit for glaucoma screening and checkup Recommended yearly dental visit for hygiene and checkup  Vaccinations: Influenza vaccine: up to date, due 10/2016 Pneumococcal vaccine: completed series Tdap vaccine: declined Shingles vaccine: completed 04/23/12  Advanced directives: Advance directive discussed with you today. I have provided a copy for you to complete at home and have notarized. Once this is complete please bring a copy in to our office so we can scan it into your chart.  Conditions/risks identified: Recommend increasing water intake to 4-6 glasses a day.  Next appointment: 11/29/16 @ 11:00 AM. Need to schedule 1 year AWV and follow up with PCP.   Preventive Care 70 Years and Older, Female Preventive care refers to lifestyle choices and visits with your health care provider that can promote health and wellness. What does preventive care include?  A yearly physical exam. This is also called an annual well check.  Dental exams once or twice a year.  Routine eye exams. Ask your health care provider how often you should have your eyes checked.  Personal lifestyle choices, including:  Daily care of your teeth and gums.  Regular physical activity.  Eating a healthy diet.  Avoiding tobacco and drug use.  Limiting alcohol use.  Practicing safe sex.  Taking low-dose aspirin every day.  Taking vitamin and mineral supplements as recommended by your health care provider. What happens during an annual well check? The services and screenings  done by your health care provider during your annual well check will depend on your age, overall health, lifestyle risk factors, and family history of disease. Counseling  Your health care provider may ask you questions about your:  Alcohol use.  Tobacco use.  Drug use.  Emotional well-being.  Home and relationship well-being.  Sexual activity.  Eating habits.  History of falls.  Memory and ability to understand (cognition).  Work and work Statistician.  Reproductive health. Screening  You may have the following tests or measurements:  Height, weight, and BMI.  Blood pressure.  Lipid and cholesterol levels. These may be checked every 5 years, or more frequently if you are over 37 years old.  Skin check.  Lung cancer screening. You may have this screening every year starting at age 77 if you have a 30-pack-year history of smoking and currently smoke or have quit within the past 15 years.  Fecal occult blood test (FOBT) of the stool. You may have this test every year starting at age 36.  Flexible sigmoidoscopy or colonoscopy. You may have a sigmoidoscopy every 5 years or a colonoscopy every 10 years starting at age 17.  Hepatitis C blood test.  Hepatitis B blood test.  Sexually transmitted disease (STD) testing.  Diabetes screening. This is done by checking your blood sugar (glucose) after you have not eaten for a while (fasting). You may have this done every 1-3 years.  Bone density scan. This is done to screen for osteoporosis. You may have this done starting at age 45.  Mammogram. This may be done every 1-2 years. Talk to your health care provider about how often you should have regular mammograms. Talk with your  health care provider about your test results, treatment options, and if necessary, the need for more tests. Vaccines  Your health care provider may recommend certain vaccines, such as:  Influenza vaccine. This is recommended every year.  Tetanus,  diphtheria, and acellular pertussis (Tdap, Td) vaccine. You may need a Td booster every 10 years.  Zoster vaccine. You may need this after age 58.  Pneumococcal 13-valent conjugate (PCV13) vaccine. One dose is recommended after age 55.  Pneumococcal polysaccharide (PPSV23) vaccine. One dose is recommended after age 17. Talk to your health care provider about which screenings and vaccines you need and how often you need them. This information is not intended to replace advice given to you by your health care provider. Make sure you discuss any questions you have with your health care provider. Document Released: 03/06/2015 Document Revised: 10/28/2015 Document Reviewed: 12/09/2014 Elsevier Interactive Patient Education  2017 [place] Prevention in the Home Falls can cause injuries. They can happen to people of all ages. There are many things you can do to make your home safe and to help prevent falls. What can I do on the outside of my home?  Regularly fix the edges of walkways and driveways and fix any cracks.  Remove anything that might make you trip as you walk through a door, such as a raised step or threshold.  Trim any bushes or trees on the path to your home.  Use bright outdoor lighting.  Clear any walking paths of anything that might make someone trip, such as rocks or tools.  Regularly check to see if handrails are loose or broken. Make sure that both sides of any steps have handrails.  Any raised decks and porches should have guardrails on the edges.  Have any leaves, snow, or ice cleared regularly.  Use sand or salt on walking paths during winter.  Clean up any spills in your garage right away. This includes oil or grease spills. What can I do in the bathroom?  Use night lights.  Install grab bars by the toilet and in the tub and shower. Do not use towel bars as grab bars.  Use non-skid mats or decals in the tub or shower.  If you need to sit down in  the shower, use a plastic, non-slip stool.  Keep the floor dry. Clean up any water that spills on the floor as soon as it happens.  Remove soap buildup in the tub or shower regularly.  Attach bath mats securely with double-sided non-slip rug tape.  Do not have throw rugs and other things on the floor that can make you trip. What can I do in the bedroom?  Use night lights.  Make sure that you have a light by your bed that is easy to reach.  Do not use any sheets or blankets that are too big for your bed. They should not hang down onto the floor.  Have a firm chair that has side arms. You can use this for support while you get dressed.  Do not have throw rugs and other things on the floor that can make you trip. What can I do in the kitchen?  Clean up any spills right away.  Avoid walking on wet floors.  Keep items that you use a lot in easy-to-reach places.  If you need to reach something above you, use a strong step stool that has a grab bar.  Keep electrical cords out of the way.  Do not use  floor polish or wax that makes floors slippery. If you must use wax, use non-skid floor wax.  Do not have throw rugs and other things on the floor that can make you trip. What can I do with my stairs?  Do not leave any items on the stairs.  Make sure that there are handrails on both sides of the stairs and use them. Fix handrails that are broken or loose. Make sure that handrails are as long as the stairways.  Check any carpeting to make sure that it is firmly attached to the stairs. Fix any carpet that is loose or worn.  Avoid having throw rugs at the top or bottom of the stairs. If you do have throw rugs, attach them to the floor with carpet tape.  Make sure that you have a light switch at the top of the stairs and the bottom of the stairs. If you do not have them, ask someone to add them for you. What else can I do to help prevent falls?  Wear shoes that:  Do not have high  heels.  Have rubber bottoms.  Are comfortable and fit you well.  Are closed at the toe. Do not wear sandals.  If you use a stepladder:  Make sure that it is fully opened. Do not climb a closed stepladder.  Make sure that both sides of the stepladder are locked into place.  Ask someone to hold it for you, if possible.  Clearly mark and make sure that you can see:  Any grab bars or handrails.  First and last steps.  Where the edge of each step is.  Use tools that help you move around (mobility aids) if they are needed. These include:  Canes.  Walkers.  Scooters.  Crutches.  Turn on the lights when you go into a dark area. Replace any light bulbs as soon as they burn out.  Set up your furniture so you have a clear path. Avoid moving your furniture around.  If any of your floors are uneven, fix them.  If there are any pets around you, be aware of where they are.  Review your medicines with your doctor. Some medicines can make you feel dizzy. This can increase your chance of falling. Ask your doctor what other things that you can do to help prevent falls. This information is not intended to replace advice given to you by your health care provider. Make sure you discuss any questions you have with your health care provider. Document Released: 12/04/2008 Document Revised: 07/16/2015 Document Reviewed: 03/14/2014 Elsevier Interactive Patient Education  2017 [employer].

## 2016-08-04 NOTE — Telephone Encounter (Signed)
Patient was notified. Patient stated that she would like to get this done early next week if possible.

## 2016-08-06 ENCOUNTER — Emergency Department: Payer: Medicare Other

## 2016-08-06 ENCOUNTER — Emergency Department
Admission: EM | Admit: 2016-08-06 | Discharge: 2016-08-07 | Disposition: A | Discharge status: Home / Self Care | Payer: Medicare Other | Attending: Emergency Medicine | Admitting: Emergency Medicine

## 2016-08-06 DIAGNOSIS — N183 Chronic kidney disease, stage 3 (moderate): Secondary | ICD-10-CM | POA: Diagnosis not present

## 2016-08-06 DIAGNOSIS — J449 Chronic obstructive pulmonary disease, unspecified: Secondary | ICD-10-CM | POA: Diagnosis not present

## 2016-08-06 DIAGNOSIS — I714 Abdominal aortic aneurysm, without rupture, unspecified: Secondary | ICD-10-CM

## 2016-08-06 DIAGNOSIS — K5792 Diverticulitis of intestine, part unspecified, without perforation or abscess without bleeding: Secondary | ICD-10-CM | POA: Diagnosis not present

## 2016-08-06 DIAGNOSIS — R1030 Lower abdominal pain, unspecified: Secondary | ICD-10-CM | POA: Diagnosis present

## 2016-08-06 DIAGNOSIS — R1032 Left lower quadrant pain: Secondary | ICD-10-CM | POA: Diagnosis not present

## 2016-08-06 DIAGNOSIS — I2510 Atherosclerotic heart disease of native coronary artery without angina pectoris: Secondary | ICD-10-CM | POA: Diagnosis not present

## 2016-08-06 DIAGNOSIS — Z7982 Long term (current) use of aspirin: Secondary | ICD-10-CM | POA: Diagnosis not present

## 2016-08-06 DIAGNOSIS — R109 Unspecified abdominal pain: Secondary | ICD-10-CM | POA: Diagnosis not present

## 2016-08-06 DIAGNOSIS — Z79899 Other long term (current) drug therapy: Secondary | ICD-10-CM | POA: Insufficient documentation

## 2016-08-06 DIAGNOSIS — Z7902 Long term (current) use of antithrombotics/antiplatelets: Secondary | ICD-10-CM | POA: Diagnosis not present

## 2016-08-06 DIAGNOSIS — F17210 Nicotine dependence, cigarettes, uncomplicated: Secondary | ICD-10-CM | POA: Insufficient documentation

## 2016-08-06 DIAGNOSIS — Z794 Long term (current) use of insulin: Secondary | ICD-10-CM | POA: Diagnosis not present

## 2016-08-06 DIAGNOSIS — E1122 Type 2 diabetes mellitus with diabetic chronic kidney disease: Secondary | ICD-10-CM | POA: Diagnosis not present

## 2016-08-06 DIAGNOSIS — Z7984 Long term (current) use of oral hypoglycemic drugs: Secondary | ICD-10-CM | POA: Insufficient documentation

## 2016-08-06 DIAGNOSIS — I129 Hypertensive chronic kidney disease with stage 1 through stage 4 chronic kidney disease, or unspecified chronic kidney disease: Secondary | ICD-10-CM | POA: Diagnosis not present

## 2016-08-06 DIAGNOSIS — N281 Cyst of kidney, acquired: Secondary | ICD-10-CM | POA: Diagnosis not present

## 2016-08-06 LAB — COMPREHENSIVE METABOLIC PANEL
ALBUMIN: 4.1 g/dL (ref 3.5–5.0)
ALK PHOS: 88 U/L (ref 38–126)
ALT: 13 U/L — ABNORMAL LOW (ref 14–54)
AST: 22 U/L (ref 15–41)
Anion gap: 10 (ref 5–15)
BILIRUBIN TOTAL: 0.5 mg/dL (ref 0.3–1.2)
BUN: 29 mg/dL — AB (ref 6–20)
CALCIUM: 10.1 mg/dL (ref 8.9–10.3)
CO2: 25 mmol/L (ref 22–32)
Chloride: 101 mmol/L (ref 101–111)
Creatinine, Ser: 1.4 mg/dL — ABNORMAL HIGH (ref 0.44–1.00)
GFR calc Af Amer: 43 mL/min — ABNORMAL LOW (ref >60)
GFR, EST NON AFRICAN AMERICAN: 37 mL/min — AB (ref >60)
GLUCOSE: 128 mg/dL — AB (ref 65–99)
Potassium: 3.4 mmol/L — ABNORMAL LOW (ref 3.5–5.1)
Sodium: 136 mmol/L (ref 135–145)
TOTAL PROTEIN: 7.8 g/dL (ref 6.5–8.1)

## 2016-08-06 LAB — CBC
HCT: 37.2 % (ref 35.0–47.0)
Hemoglobin: 11.5 g/dL — ABNORMAL LOW (ref 12.0–16.0)
MCH: 21.9 pg — ABNORMAL LOW (ref 26.0–34.0)
MCHC: 31 g/dL — ABNORMAL LOW (ref 32.0–36.0)
MCV: 70.7 fL — ABNORMAL LOW (ref 80.0–100.0)
Platelets: 257 10*3/uL (ref 150–440)
RBC: 5.26 MIL/uL — ABNORMAL HIGH (ref 3.80–5.20)
RDW: 20.1 % — AB (ref 11.5–14.5)
WBC: 13.3 10*3/uL — AB (ref 3.6–11.0)

## 2016-08-06 LAB — LIPASE, BLOOD: Lipase: 52 U/L — ABNORMAL HIGH (ref 11–51)

## 2016-08-06 MED ORDER — MORPHINE SULFATE (PF) 4 MG/ML IV SOLN
4.0000 mg | Freq: Once | INTRAVENOUS | Status: AC
  Administered 2016-08-06: 4 mg via INTRAVENOUS
  Filled 2016-08-06: qty 1

## 2016-08-06 MED ORDER — SODIUM CHLORIDE 0.9 % IV BOLUS (SEPSIS)
1000.0000 mL | Freq: Once | INTRAVENOUS | Status: AC
  Administered 2016-08-06: 1000 mL via INTRAVENOUS

## 2016-08-06 MED ORDER — METRONIDAZOLE 500 MG PO TABS
500.0000 mg | ORAL_TABLET | Freq: Once | ORAL | Status: AC
  Administered 2016-08-06: 500 mg via ORAL
  Filled 2016-08-06: qty 1

## 2016-08-06 MED ORDER — OXYCODONE HCL 5 MG PO TABS
5.0000 mg | ORAL_TABLET | Freq: Once | ORAL | Status: AC
  Administered 2016-08-06: 5 mg via ORAL
  Filled 2016-08-06: qty 1

## 2016-08-06 MED ORDER — ONDANSETRON 4 MG PO TBDP: 4.0000 mg | ORAL_TABLET | Freq: Three times a day (TID) | ORAL | 0 refills | Status: AC | PRN

## 2016-08-06 MED ORDER — METRONIDAZOLE 500 MG PO TABS: 500.0000 mg | ORAL_TABLET | Freq: Three times a day (TID) | ORAL | 0 refills | Status: AC

## 2016-08-06 MED ORDER — AMOXICILLIN-POT CLAVULANATE 875-125 MG PO TABS: 1.0000 | ORAL_TABLET | Freq: Two times a day (BID) | ORAL | 0 refills | Status: AC

## 2016-08-06 MED ORDER — MORPHINE SULFATE (PF) 2 MG/ML IV SOLN
2.0000 mg | Freq: Once | INTRAVENOUS | Status: AC
  Administered 2016-08-06: 2 mg via INTRAVENOUS
  Filled 2016-08-06: qty 1

## 2016-08-06 MED ORDER — ONDANSETRON HCL 4 MG/2ML IJ SOLN
4.0000 mg | Freq: Once | INTRAMUSCULAR | Status: AC
  Administered 2016-08-06: 4 mg via INTRAVENOUS
  Filled 2016-08-06: qty 2

## 2016-08-06 MED ORDER — OXYCODONE-ACETAMINOPHEN 5-325 MG PO TABS: 1.0000 | ORAL_TABLET | Freq: Four times a day (QID) | ORAL | 0 refills | Status: DC | PRN

## 2016-08-06 MED ORDER — ACETAMINOPHEN 500 MG PO TABS
1000.0000 mg | ORAL_TABLET | Freq: Once | ORAL | Status: AC
  Administered 2016-08-06: 1000 mg via ORAL
  Filled 2016-08-06: qty 2

## 2016-08-06 MED ORDER — AMOXICILLIN-POT CLAVULANATE 875-125 MG PO TABS
1.0000 | ORAL_TABLET | Freq: Once | ORAL | Status: AC
  Administered 2016-08-06: 1 via ORAL
  Filled 2016-08-06: qty 1

## 2016-08-06 NOTE — ED Notes (Signed)
Pt slurring speech, drifting in and out of sleep, however when awake still rates pain an 8/10.

## 2016-08-06 NOTE — ED Notes (Signed)
Pt sleeping. 

## 2016-08-06 NOTE — ED Triage Notes (Signed)
Pt arrived via ems for c/o abd pain - she ate lunch at [employer] and then around 230pm she felt like she needed to have a BM so she used a supp and had small results - when she awoke from a nap at 5pm she was having severe left sided pain unlike pain she has had before with constipation - pt last BM was 3 days ago

## 2016-08-06 NOTE — ED Notes (Signed)
Pt's spouse out to desk states pt is "hurting again".

## 2016-08-07 NOTE — Discharge Instructions (Signed)
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 You need repeat ultrasound and follow-up with vascular surgery. Please call her vascular surgeon listed above to make sure this is followed up.

## 2016-08-07 NOTE — ED Provider Notes (Signed)
[facility] Emergency Department Provider Note  ____________________________________________  Time seen: Approximately 12:01 AM  I have reviewed the triage vital signs and the nursing notes.   HISTORY  Chief Complaint Abdominal Pain   HPI Hannah Kirby is a 71 y.o. female with a history of diabetes, hypertension, hyperlipidemia, coronary artery disease presents for evaluation of abdominal pain. Patient reports that she ate lunch at [employer] and on hour later started having left-sided abdominal pain. She felt like she needed to have a bowel movement. She took a suppository and had a small BM. She took a nap and when she woke up at 5 PM she started having severe left-sided chest pain that she describes as a constant dull severe pain associated with intermittent sharp component located in the left lower quadrant and nonradiating. She has had nausea but no vomiting. She's been constipated for 3 days which is chronic for her. No fever or chills, no dysuria or hematuria, no chest pain or shortness of breath. No prior abdominal surgeries.  Past Medical History:  Diagnosis Date  . Anemia   . Arthritis   . CAD (coronary artery disease)    a. cath 02/2006: BMS x 2 to RCA, cath o/w without significant coronary disease; b. nuclear stress test 07/2014: no signs of ischemia, no ekg changes concerning for ischemia, low risk study/normal study  . Chronic bronchitis ([place])    secondary to cigarette smoking  . COPD (chronic obstructive pulmonary disease) ([place])   . Diabetes mellitus   . FHx: allergies   . GERD (gastroesophageal reflux disease)   . Goiter   . Granulomatous disease ([place])   . Hernia   . Hyperlipidemia   . Hypertension   . Kidney stone on left side 2013  . Microalbuminuria   . Obesity   . Spinal stenosis     Patient Active Problem List   Diagnosis Date Noted  . 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 Chronic kidney disease, stage 3 07/27/2015  . Chest  pain at rest 05/06/2015  . 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 Solitary pulmonary nodule on lung CT 08/20/2014  . Allergic rhinitis 07/28/2014  . CAFL (chronic airflow limitation) ([place]) 07/28/2014  . Abnormal liver enzymes 07/28/2014  . Acid reflux 07/28/2014  . Head revolving around 07/28/2014  . CAD (coronary artery disease)   . Obesity   . Granulomatous disease ([place])   . Back pain 11/12/2013  . Scoliosis 10/30/2013  . Lumbar radiculopathy 10/30/2013  . Lumbar canal stenosis 10/30/2013  . Calcium blood increased 10/22/2013  . Microalbuminuria 10/22/2013  . Smoking 03/21/2011  . Obese 03/21/2011  . Edema 08/18/2010  . Hyperlipidemia 03/25/2009  . Benign essential HTN 03/25/2009    Past Surgical History:  Procedure Laterality Date  . ABDOMINAL HYSTERECTOMY    . BREAST BIOPSY    . COLONOSCOPY WITH PROPOFOL N/A 09/23/2014   Procedure: COLONOSCOPY WITH PROPOFOL;  Surgeon: Lollie Sails, MD;  Location[facility] ENDOSCOPY;  Service: Endoscopy;  Laterality: N/A;  . CORONARY ANGIOPLASTY WITH STENT PLACEMENT  2008  . ESOPHAGOGASTRODUODENOSCOPY (EGD) WITH PROPOFOL N/A 12/30/2014   Procedure: ESOPHAGOGASTRODUODENOSCOPY (EGD) WITH PROPOFOL;  Surgeon: Lollie Sails, MD;  Location: [facility] ENDOSCOPY;  Service: Endoscopy;  Laterality: N/A;  . ESOPHAGOGASTRODUODENOSCOPY (EGD) WITH PROPOFOL N/A 07/19/2016   Procedure: ESOPHAGOGASTRODUODENOSCOPY (EGD) WITH PROPOFOL;  Surgeon: Lollie Sails, MD;  Location: [facility] ENDOSCOPY;  Service: Endoscopy;  Laterality: N/A;  . hysterectomy (other)      Prior to  Admission medications   Medication Sig Start Date End Date Taking? Authorizing Provider  albuterol (VENTOLIN HFA) 108 (90 Base) MCG/ACT inhaler Inhale 2 puffs into the lungs every 4 (four) hours as needed for wheezing or shortness of breath. 11/24/15  Yes Birdie Sons, MD  amLODipine (NORVASC) 5 MG tablet Take 1 tablet (5 mg total) by mouth daily. 06/20/16  Yes Minna Merritts, MD  aspirin (ASPIR-81) 81 MG EC tablet Take 81 mg by mouth daily.     Yes [provider]  chlorhexidine (PERIDEX) 0.12 % solution Use as directed 15 mLs in the mouth or throat 2 (two) times daily. Patient taking differently: Use as directed 15 mLs in the mouth or throat as needed.  08/18/10  Yes Minna Merritts, MD  clopidogrel (PLAVIX) 75 MG tablet Take 1 tablet (75 mg total) by mouth daily. 11/19/15  Yes Minna Merritts, MD  ezetimibe (ZETIA) 10 MG tablet Take 1 tablet (10 mg total) by mouth daily. 06/08/16  Yes Gollan, Kathlene November, MD  fluticasone furoate-vilanterol (BREO ELLIPTA) 100-25 MCG/INH AEPB Inhale 1 puff into the lungs daily. 06/08/16  Yes Birdie Sons, MD  HYDROcodone-acetaminophen (NORCO/VICODIN) 5-325 MG tablet Take 1 tablet by mouth every 6 (six) hours as needed. 05/30/16  Yes Birdie Sons, MD  insulin glargine (LANTUS) 100 UNIT/ML injection Inject 12 Units into the skin daily.    Yes [provider]  Liraglutide (VICTOZA) 18 MG/3ML SOLN injection Inject 1.8 mg into the skin daily.    Yes [provider]  lisinopril (PRINIVIL,ZESTRIL) 40 MG tablet Take 1 tablet (40 mg total) by mouth daily. 11/19/15  Yes Minna Merritts, MD  meclizine (ANTIVERT) 25 MG tablet Take by mouth as needed.  07/06/11  Yes [provider]  metFORMIN (GLUCOPHAGE) 1000 MG tablet Take 1,000 mg by mouth 2 (two) times daily with a meal.   Yes [provider]  metoprolol tartrate (LOPRESSOR) 25 MG tablet Take 1 tablet (25 mg total) by mouth 2 (two) times daily. 11/19/15  Yes Gollan, Kathlene November, MD  montelukast (SINGULAIR) 10 MG tablet Take 1 tablet (10 mg total) by mouth at bedtime. Patient taking differently: Take 10 mg by mouth at bedtime.  04/20/16 08/06/16 Yes Birdie Sons, MD  Multiple Vitamins-Minerals (DAILY MULTI) TABS Take 1 tablet by mouth daily.    Yes [provider]  nitroGLYCERIN (NITROSTAT) 0.4 MG SL tablet Place 1 tablet (0.4 mg  total) under the tongue every 5 (five) minutes as needed. 05/06/15  Yes Gollan, Kathlene November, MD  Omega-3 Fatty Acids (FISH OIL) 1000 MG CAPS Take 2 capsules by mouth 2 (two) times daily. Take 4   Yes [provider]  ondansetron (ZOFRAN) 4 MG tablet Take 4 mg by mouth every 8 (eight) hours as needed for nausea or vomiting.   Yes [provider]  rosuvastatin (CRESTOR) 5 MG tablet Take 1 tablet (5 mg total) by mouth daily. 05/19/16  Yes Minna Merritts, MD  tiotropium (SPIRIVA) 18 MCG inhalation capsule Place 1 capsule (18 mcg total) into inhaler and inhale daily. 05/30/16  Yes Birdie Sons, MD  amoxicillin-clavulanate (AUGMENTIN) 875-125 MG tablet Take 1 tablet by mouth 2 (two) times daily. 08/06/16 08/16/16  Rudene Re, MD  metroNIDAZOLE (FLAGYL) 500 MG tablet Take 1 tablet (500 mg total) by mouth 3 (three) times daily. 08/06/16 08/16/16  Rudene Re, MD  ondansetron (ZOFRAN ODT) 4 MG disintegrating tablet Take 1 tablet (4 mg total) by mouth  every 8 (eight) hours as needed for nausea or vomiting. 08/06/16   Alfred Levins, [place], MD  oxyCODONE-acetaminophen (ROXICET) 5-325 MG tablet Take 1 tablet by mouth every 6 (six) hours as needed. 08/06/16 08/06/17  Rudene Re, MD  RABEprazole (ACIPHEX) 20 MG tablet Take 20 mg by mouth daily.  12/30/14 08/03/16  [provider]  sucralfate (CARAFATE) 1 g tablet Take 1 g by mouth 2 (two) times daily.  12/30/14 08/03/16  [provider]    Allergies Sulfonamide derivatives; Statins; Codeine; and Prednisone  Family History  Problem Relation Age of Onset  . Heart failure Mother   . Stroke Mother   . Diabetes Mother   . Congestive Heart Failure Mother   . Lung cancer Father   . Breast cancer Maternal Aunt     Social History Social History  Substance Use Topics  . Smoking status: Current Every Day Smoker    Packs/day: 1.00    Years: 40.00    Types: Cigarettes  . Smokeless tobacco: Never Used  . Alcohol  use Yes     Comment: 1/month- wine    Review of Systems  Constitutional: Negative for fever. Eyes: Negative for visual changes. ENT: Negative for sore throat. Neck: No neck pain  Cardiovascular: Negative for chest pain. Respiratory: Negative for shortness of breath. Gastrointestinal: +abdominal pain and nausea. No vomiting or diarrhea. Genitourinary: Negative for dysuria. Musculoskeletal: Negative for back pain. Skin: Negative for rash. Neurological: Negative for headaches, weakness or numbness. Psych: No SI or HI  ____________________________________________   PHYSICAL EXAM:  VITAL SIGNS: ED Triage Vitals  Enc Vitals Group     BP 08/06/16 1949 (!) 170/90     Pulse Rate 08/06/16 1949 85     Resp 08/06/16 1949 19     Temp --      Temp src --      SpO2 08/06/16 1944 98 %     Weight 08/06/16 1945 160 lb (72.6 kg)     Height 08/06/16 1945 5\' 5"  (1.651 m)     Head Circumference --      Peak Flow --      Pain Score 08/06/16 1944 10     Pain Loc --      Pain Edu? --      Excl. in [place]? --     Constitutional: Alert and oriented. Well appearing, in significant distress due to the pain HEENT:      Head: Normocephalic and atraumatic.         Eyes: Conjunctivae are normal. Sclera is non-icteric.       Mouth/Throat: Mucous membranes are moist.       Neck: Supple with no signs of meningismus. Cardiovascular: Regular rate and rhythm. No murmurs, gallops, or rubs. 2+ symmetrical distal pulses are present in all extremities. No JVD. Respiratory: Normal respiratory effort. Lungs are clear to auscultation bilaterally. No wheezes, crackles, or rhonchi.  Gastrointestinal: Soft, tender to palpation on the left quadrants, and non distended with positive bowel sounds. No rebound or guarding. Musculoskeletal: Nontender with normal range of motion in all extremities. No edema, cyanosis, or erythema of extremities. Neurologic: Normal speech and language. Face is symmetric. Moving all  extremities. No gross focal neurologic deficits are appreciated. Skin: Skin is warm, dry and intact. No rash noted. Psychiatric: Mood and affect are normal. Speech and behavior are normal.  ____________________________________________   LABS (all labs ordered are listed, but only abnormal results are displayed)  Labs Reviewed  LIPASE, BLOOD - Abnormal;  Notable for the following:       Result Value   Lipase 52 (*)    All other components within normal limits  COMPREHENSIVE METABOLIC PANEL - Abnormal; Notable for the following:    Potassium 3.4 (*)    Glucose, Bld 128 (*)    BUN 29 (*)    Creatinine, Ser 1.40 (*)    ALT 13 (*)    GFR calc non Af Amer 37 (*)    GFR calc Af Amer 43 (*)    All other components within normal limits  CBC - Abnormal; Notable for the following:    WBC 13.3 (*)    RBC 5.26 (*)    Hemoglobin 11.5 (*)    MCV 70.7 (*)    MCH 21.9 (*)    MCHC 31.0 (*)    RDW 20.1 (*)    All other components within normal limits  URINALYSIS, COMPLETE (UACMP) WITH MICROSCOPIC   ____________________________________________  EKG  none  ____________________________________________  RADIOLOGY  CT a/p: 1. Question of mild wall thickening at the distal descending colon, with trace free fluid along the left paracolic gutter, and diverticulosis at the distal descending and sigmoid colon. This could reflect very mild acute diverticulitis. No evidence of perforation or abscess formation. 2. **An incidental finding of potential clinical significance has been found. Aneurysmal dilatation of the proximal abdominal aorta to 3.5 cm in AP dimension at the level of the diaphragm. Recommend followup by ultrasound in 2 years. This recommendation follows ACR consensus guidelines: White Paper of the ACR Incidental Findings Committee II on Vascular Findings. J Am Coll Radiol 2013; 10:789-794.** 3. Scattered aortic atherosclerosis. 4. Scattered coronary artery calcifications  seen. 5. Bilateral renal cysts noted. 3 mm nonobstructing stone at the interpole region of the left kidney. ____________________________________________   PROCEDURES  Procedure(s) performed: None Procedures Critical Care performed:  None ____________________________________________   INITIAL IMPRESSION / ASSESSMENT AND PLAN / ED COURSE   71 y.o. female with a history of diabetes, hypertension, hyperlipidemia, coronary artery disease presents for evaluation of abdominal pain. CT concerning for diverticulitis. Blood work with no acute findings other than leukocytosis with white count of 13. Patient was started on Augmentin and Flagyl. Pain well controlled, she is tolerating by mouth. CT also showing an incidental finding of a 3.5 cm proximal abdominal aorta aneurysm. No evidence of rupture or dissection. Patient with strong distal pulses and normal neuro exam. Instructions in writing and verbally given to patient and her husband. Patient referred to Dr. Lucky Cowboy, vascular surgery for monitoring as an outpatient. Patient discharged home on antibiotics, Percocet and Zofran. Discussed return precautions for worsening infection.     Pertinent labs & imaging results that were available during my care of the patient were reviewed by me and considered in my medical decision making (see chart for details).    ____________________________________________   FINAL CLINICAL IMPRESSION(S) / ED DIAGNOSES  Final diagnoses:  Diverticulitis  Abdominal aortic aneurysm (AAA) without rupture (HCC)      NEW MEDICATIONS STARTED DURING THIS VISIT:  New Prescriptions   AMOXICILLIN-CLAVULANATE (AUGMENTIN) 875-125 MG TABLET    Take 1 tablet by mouth 2 (two) times daily.   METRONIDAZOLE (FLAGYL) 500 MG TABLET    Take 1 tablet (500 mg total) by mouth 3 (three) times daily.   ONDANSETRON (ZOFRAN ODT) 4 MG DISINTEGRATING TABLET    Take 1 tablet (4 mg total) by mouth every 8 (eight) hours as needed for nausea or  vomiting.   OXYCODONE-ACETAMINOPHEN (  ROXICET) 5-325 MG TABLET    Take 1 tablet by mouth every 6 (six) hours as needed.     Note:  This document was prepared using Dragon voice recognition software and may include unintentional dictation errors.    Alfred Levins, [place], MD 08/07/16 [phone]

## 2016-08-07 NOTE — ED Notes (Signed)
Pt's pox checked off her acrylic nails prior to discharge, with acyrilic nails on pt with pox of 88% on ra. When pox checked on finger beneath acyrilic nails pox 04% on ra.

## 2016-08-08 ENCOUNTER — Ambulatory Visit
Admission: RE | Admit: 2016-08-08 | Discharge: 2016-08-08 | Disposition: A | Discharge status: Home / Self Care | Payer: Medicare Other | Source: Ambulatory Visit | Attending: Family Medicine | Admitting: Family Medicine

## 2016-08-08 ENCOUNTER — Encounter: Payer: Self-pay | Admitting: Family Medicine

## 2016-08-08 ENCOUNTER — Ambulatory Visit (INDEPENDENT_AMBULATORY_CARE_PROVIDER_SITE_OTHER): Payer: Medicare Other | Admitting: Family Medicine

## 2016-08-08 VITALS — BP 110/70 | Temp 98.6°F | Resp 16 | Wt 158.0 lb

## 2016-08-08 DIAGNOSIS — I2510 Atherosclerotic heart disease of native coronary artery without angina pectoris: Secondary | ICD-10-CM | POA: Diagnosis not present

## 2016-08-08 DIAGNOSIS — I714 Abdominal aortic aneurysm, without rupture, unspecified: Secondary | ICD-10-CM

## 2016-08-08 DIAGNOSIS — R918 Other nonspecific abnormal finding of lung field: Secondary | ICD-10-CM | POA: Diagnosis not present

## 2016-08-08 DIAGNOSIS — R911 Solitary pulmonary nodule: Secondary | ICD-10-CM

## 2016-08-08 DIAGNOSIS — K5792 Diverticulitis of intestine, part unspecified, without perforation or abscess without bleeding: Secondary | ICD-10-CM | POA: Diagnosis not present

## 2016-08-08 NOTE — Progress Notes (Signed)
Patient: Hannah Kirby Female    DOB: 1945-07-19   71 y.o.   MRN: [id number] Visit Date: 08/08/2016  Today's Provider: Lelon Huh, MD   Chief Complaint  Patient presents with  . Hospitalization Follow-up   Subjective:    HPI   Follow up ER visit  Patient was seen in ER for LLQ and LUQ abdominal pain on 08/06/2016. She was treated for diverticulitis and incidental finding of 3.5cm Abdominal aortic aneurysm (AAA) without rupture. . Patient referred to Dr. Lucky Cowboy, vascular surgery for monitoring and has appointment scheduled 08-12-2016. Patient discharged home on Augmentin and metronidazole, Percocet and Zofran. Discussed return precautions for worsening infection.  She had eaten at [facility] prior to onset of pain which became progressively worse over the next several hours. She had mildly elevated WBC, borderline lipase.Stable Met C. Pain has improved greatlly, is not affected by eating. Has been constipated for a long time. She had colonoscopy in 2016 showing colon polyps She reports good compliance with treatment. She reports this condition is Improved.  ----------------------------------------------------------------    Allergies  Allergen Reactions  . Sulfonamide Derivatives Other (See Comments)    Last taken as a child; made her mouth break out  . Statins Other (See Comments)    Caused elevated liver function studies  . Codeine Nausea And Vomiting  . Prednisone Palpitations and Other (See Comments)    Made her legs "feel weird."     Current Outpatient Prescriptions:  .  albuterol (VENTOLIN HFA) 108 (90 Base) MCG/ACT inhaler, Inhale 2 puffs into the lungs every 4 (four) hours as needed for wheezing or shortness of breath., Disp: 48 g, Rfl: 3 .  amLODipine (NORVASC) 5 MG tablet, Take 1 tablet (5 mg total) by mouth daily., Disp: 90 tablet, Rfl: 3 .  amoxicillin-clavulanate (AUGMENTIN) 875-125 MG tablet, Take 1 tablet by mouth 2 (two) times daily., Disp: 20  tablet, Rfl: 0 .  aspirin (ASPIR-81) 81 MG EC tablet, Take 81 mg by mouth daily.  , Disp: , Rfl:  .  chlorhexidine (PERIDEX) 0.12 % solution, Use as directed 15 mLs in the mouth or throat 2 (two) times daily. (Patient taking differently: Use as directed 15 mLs in the mouth or throat as needed. ), Disp: 120 mL, Rfl: 6 .  clopidogrel (PLAVIX) 75 MG tablet, Take 1 tablet (75 mg total) by mouth daily., Disp: 90 tablet, Rfl: 3 .  ezetimibe (ZETIA) 10 MG tablet, Take 1 tablet (10 mg total) by mouth daily., Disp: 90 tablet, Rfl: 3 .  fluticasone furoate-vilanterol (BREO ELLIPTA) 100-25 MCG/INH AEPB, Inhale 1 puff into the lungs daily., Disp: 84 each, Rfl: 3 .  HYDROcodone-acetaminophen (NORCO/VICODIN) 5-325 MG tablet, Take 1 tablet by mouth every 6 (six) hours as needed., Disp: 120 tablet, Rfl: 0 .  insulin glargine (LANTUS) 100 UNIT/ML injection, Inject 12 Units into the skin daily. , Disp: , Rfl:  .  Liraglutide (VICTOZA) 18 MG/3ML SOLN injection, Inject 1.8 mg into the skin daily. , Disp: , Rfl:  .  lisinopril (PRINIVIL,ZESTRIL) 40 MG tablet, Take 1 tablet (40 mg total) by mouth daily., Disp: 90 tablet, Rfl: 3 .  meclizine (ANTIVERT) 25 MG tablet, Take by mouth as needed. , Disp: , Rfl:  .  metFORMIN (GLUCOPHAGE) 1000 MG tablet, Take 1,000 mg by mouth 2 (two) times daily with a meal., Disp: , Rfl:  .  metoprolol tartrate (LOPRESSOR) 25 MG tablet, Take 1 tablet (25 mg total) by mouth 2 (two)  times daily., Disp: 180 tablet, Rfl: 3 .  metroNIDAZOLE (FLAGYL) 500 MG tablet, Take 1 tablet (500 mg total) by mouth 3 (three) times daily., Disp: 30 tablet, Rfl: 0 .  montelukast (SINGULAIR) 10 MG tablet, Take 1 tablet (10 mg total) by mouth at bedtime. (Patient taking differently: Take 10 mg by mouth at bedtime. ), Disp: 30 tablet, Rfl: 12 .  Multiple Vitamins-Minerals (DAILY MULTI) TABS, Take 1 tablet by mouth daily. , Disp: , Rfl:  .  nitroGLYCERIN (NITROSTAT) 0.4 MG SL tablet, Place 1 tablet (0.4 mg total) under  the tongue every 5 (five) minutes as needed., Disp: 25 tablet, Rfl: 6 .  Omega-3 Fatty Acids (FISH OIL) 1000 MG CAPS, Take 2 capsules by mouth 2 (two) times daily. Take 4, Disp: , Rfl:  .  ondansetron (ZOFRAN ODT) 4 MG disintegrating tablet, Take 1 tablet (4 mg total) by mouth every 8 (eight) hours as needed for nausea or vomiting., Disp: 20 tablet, Rfl: 0 .  ondansetron (ZOFRAN) 4 MG tablet, Take 4 mg by mouth every 8 (eight) hours as needed for nausea or vomiting., Disp: , Rfl:  .  oxyCODONE-acetaminophen (ROXICET) 5-325 MG tablet, Take 1 tablet by mouth every 6 (six) hours as needed., Disp: 20 tablet, Rfl: 0 .  RABEprazole (ACIPHEX) 20 MG tablet, Take 20 mg by mouth daily. , Disp: , Rfl:  .  rosuvastatin (CRESTOR) 5 MG tablet, Take 1 tablet (5 mg total) by mouth daily., Disp: 90 tablet, Rfl: 3 .  sucralfate (CARAFATE) 1 g tablet, Take 1 g by mouth 2 (two) times daily. , Disp: , Rfl:  .  tiotropium (SPIRIVA) 18 MCG inhalation capsule, Place 1 capsule (18 mcg total) into inhaler and inhale daily., Disp: 90 capsule, Rfl: 4  Review of Systems  Constitutional: Negative for appetite change, chills, fatigue and fever.  Respiratory: Negative for chest tightness and shortness of breath.   Cardiovascular: Negative for chest pain and palpitations.  Gastrointestinal: Negative for abdominal pain, nausea and vomiting.  Neurological: Negative for dizziness and weakness.    Social History  Substance Use Topics  . Smoking status: Current Every Day Smoker    Packs/day: 1.00    Years: 40.00    Types: Cigarettes  . Smokeless tobacco: Never Used  . Alcohol use Yes     Comment: 1/month- wine   Objective:   There were no vitals taken for this visit. Vitals:   08/08/16 1430  BP: 110/70  Resp: 16  Temp: 98.6 F (37 C)  TempSrc: Oral  Weight: 158 lb (71.7 kg)     Physical Exam    General Appearance:    Alert, cooperative, no distress  Eyes:    PERRL, conjunctiva/corneas clear, EOM's intact        Lungs:     Clear to auscultation bilaterally, respirations unlabored  Heart:    Regular rate and rhythm  Abdomen:   bowel sounds present and normal in all 4 quadrants, soft, round, non-distended.Slight LUQ and LLQ tenderness. No rebound. No guarding.         Assessment & Plan:     1. Diverticulitis Greatly improved over the last 2 days. Is to finish antibiotic. Counseled on prudent diet. Call if symptoms change or if not rapidly improving.     2. Abdominal aortic aneurysm (AAA) without rupture ([place]) Incidental finding on abdominal CT from ER. Counseled on importance of appropriate follow up and keep follow up appointment with Dr. Lucky Cowboy.  Lelon Huh, MD  [facility]

## 2016-08-08 NOTE — Patient Instructions (Signed)

## 2016-08-08 NOTE — Progress Notes (Signed)
Advised  ED 

## 2016-08-12 ENCOUNTER — Encounter (INDEPENDENT_AMBULATORY_CARE_PROVIDER_SITE_OTHER): Payer: Self-pay | Admitting: Vascular Surgery

## 2016-08-12 ENCOUNTER — Ambulatory Visit (INDEPENDENT_AMBULATORY_CARE_PROVIDER_SITE_OTHER): Payer: Medicare Other | Admitting: Vascular Surgery

## 2016-08-12 VITALS — BP 122/70 | HR 82 | Resp 16 | Ht 65.0 in | Wt 157.6 lb

## 2016-08-12 DIAGNOSIS — F17200 Nicotine dependence, unspecified, uncomplicated: Secondary | ICD-10-CM

## 2016-08-12 DIAGNOSIS — N183 Chronic kidney disease, stage 3 unspecified: Secondary | ICD-10-CM

## 2016-08-12 DIAGNOSIS — E782 Mixed hyperlipidemia: Secondary | ICD-10-CM | POA: Diagnosis not present

## 2016-08-12 DIAGNOSIS — I714 Abdominal aortic aneurysm, without rupture, unspecified: Secondary | ICD-10-CM

## 2016-08-12 DIAGNOSIS — I10 Essential (primary) hypertension: Secondary | ICD-10-CM

## 2016-08-12 DIAGNOSIS — I2510 Atherosclerotic heart disease of native coronary artery without angina pectoris: Secondary | ICD-10-CM | POA: Diagnosis not present

## 2016-08-12 DIAGNOSIS — F17210 Nicotine dependence, cigarettes, uncomplicated: Secondary | ICD-10-CM | POA: Diagnosis not present

## 2016-08-12 NOTE — Assessment & Plan Note (Signed)
Try to avoid contrast with evaluation as much as possible. Plan duplex

## 2016-08-12 NOTE — Progress Notes (Signed)
Patient ID: Hannah Kirby, female   DOB: 1945-09-18, 71 y.o.   MRN: [id number]  Chief Complaint  Patient presents with  . New Patient (Initial Visit)    HPI Hannah Kirby is a 71 y.o. female.  I am asked to see the patient by Dr. Alfred Levins in the [facility] ER for evaluation of AAA.  The patient reports Recent left lower quadrant abdominal pain which prompted a trip to the emergency department including a CT scan. I have independently reviewed her CT scan and this demonstrates a 3.5 cm AAA just above and at the visceral vessels. Prior to this, she had no knowledge of the aneurysm. Her pain from her diverticulitis has essentially resolved and she denies abdominal pain currently. She denies signs of peripheral embolization new back pain. Sounds like she had a nephew who had an aneurysm but no first-degree relatives with an aneurysm that she knows of. She denies fever or chills.   Past Medical History:  Diagnosis Date  . Anemia   . Arthritis   . CAD (coronary artery disease)    a. cath 02/2006: BMS x 2 to RCA, cath o/w without significant coronary disease; b. nuclear stress test 07/2014: no signs of ischemia, no ekg changes concerning for ischemia, low risk study/normal study  . Chronic bronchitis ([place])    secondary to cigarette smoking  . COPD (chronic obstructive pulmonary disease) ([place])   . Diabetes mellitus   . FHx: allergies   . GERD (gastroesophageal reflux disease)   . Goiter   . 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)   . Hernia   . Hyperlipidemia   . Hypertension   . Kidney stone on left side 2013  . Microalbuminuria   . Obesity   . Spinal stenosis     Past Surgical History:  Procedure Laterality Date  . ABDOMINAL HYSTERECTOMY    . BREAST BIOPSY    . COLONOSCOPY WITH PROPOFOL N/A 09/23/2014   Procedure: COLONOSCOPY WITH PROPOFOL;  Surgeon: Lollie Sails, MD;  Location: [facility] ENDOSCOPY;  Service: Endoscopy;  Laterality: N/A;  . CORONARY ANGIOPLASTY WITH STENT PLACEMENT  2008  .  ESOPHAGOGASTRODUODENOSCOPY (EGD) WITH PROPOFOL N/A 12/30/2014   Procedure: ESOPHAGOGASTRODUODENOSCOPY (EGD) WITH PROPOFOL;  Surgeon: Lollie Sails, MD;  Location: [facility] ENDOSCOPY;  Service: Endoscopy;  Laterality: N/A;  . ESOPHAGOGASTRODUODENOSCOPY (EGD) WITH PROPOFOL N/A 07/19/2016   Procedure: ESOPHAGOGASTRODUODENOSCOPY (EGD) WITH PROPOFOL;  Surgeon: Lollie Sails, MD;  Location: [facility] ENDOSCOPY;  Service: Endoscopy;  Laterality: N/A;  . hysterectomy (other)      Family History  Problem Relation Age of Onset  . Heart failure Mother   . Stroke Mother   . Diabetes Mother   . Congestive Heart Failure Mother   . Lung cancer Father   . Breast cancer Maternal Aunt     Social History Social History  Substance Use Topics  . Smoking status: Current Every Day Smoker    Packs/day: 1.00    Years: 40.00    Types: Cigarettes  . Smokeless tobacco: Never Used  . Alcohol use Yes     Comment: 1/month- wine  No IVDU  Allergies  Allergen Reactions  . Sulfonamide Derivatives Other (See Comments)    Last taken as a child; made her mouth break out  . Statins Other (See Comments)    Caused elevated liver function studies  . Codeine Nausea And Vomiting  . Prednisone Palpitations and Other (See Comments)    Made her legs "feel weird."    Current Outpatient  Prescriptions  Medication Sig Dispense Refill  . albuterol (VENTOLIN HFA) 108 (90 Base) MCG/ACT inhaler Inhale 2 puffs into the lungs every 4 (four) hours as needed for wheezing or shortness of breath. 48 g 3  . amLODipine (NORVASC) 5 MG tablet Take 1 tablet (5 mg total) by mouth daily. 90 tablet 3  . amoxicillin-clavulanate (AUGMENTIN) 875-125 MG tablet Take 1 tablet by mouth 2 (two) times daily. 20 tablet 0  . aspirin (ASPIR-81) 81 MG EC tablet Take 81 mg by mouth daily.      . chlorhexidine (PERIDEX) 0.12 % solution Use as directed 15 mLs in the mouth or throat 2 (two) times daily. (Patient taking differently: Use as directed 15 mLs  in the mouth or throat as needed. ) 120 mL 6  . clopidogrel (PLAVIX) 75 MG tablet Take 1 tablet (75 mg total) by mouth daily. 90 tablet 3  . ezetimibe (ZETIA) 10 MG tablet Take 1 tablet (10 mg total) by mouth daily. 90 tablet 3  . fluticasone furoate-vilanterol (BREO ELLIPTA) 100-25 MCG/INH AEPB Inhale 1 puff into the lungs daily. 84 each 3  . HYDROcodone-acetaminophen (NORCO/VICODIN) 5-325 MG tablet Take 1 tablet by mouth every 6 (six) hours as needed. 120 tablet 0  . insulin glargine (LANTUS) 100 UNIT/ML injection Inject 12 Units into the skin daily.     . Liraglutide (VICTOZA) 18 MG/3ML SOLN injection Inject 1.8 mg into the skin daily.     Marland Kitchen lisinopril (PRINIVIL,ZESTRIL) 40 MG tablet Take 1 tablet (40 mg total) by mouth daily. 90 tablet 3  . meclizine (ANTIVERT) 25 MG tablet Take by mouth as needed.     . metFORMIN (GLUCOPHAGE) 1000 MG tablet Take 1,000 mg by mouth 2 (two) times daily with a meal.    . metoprolol tartrate (LOPRESSOR) 25 MG tablet Take 1 tablet (25 mg total) by mouth 2 (two) times daily. 180 tablet 3  . metroNIDAZOLE (FLAGYL) 500 MG tablet Take 1 tablet (500 mg total) by mouth 3 (three) times daily. 30 tablet 0  . Multiple Vitamins-Minerals (DAILY MULTI) TABS Take 1 tablet by mouth daily.     . nitroGLYCERIN (NITROSTAT) 0.4 MG SL tablet Place 1 tablet (0.4 mg total) under the tongue every 5 (five) minutes as needed. 25 tablet 6  . Omega-3 Fatty Acids (FISH OIL) 1000 MG CAPS Take 2 capsules by mouth 2 (two) times daily. Take 4    . ondansetron (ZOFRAN ODT) 4 MG disintegrating tablet Take 1 tablet (4 mg total) by mouth every 8 (eight) hours as needed for nausea or vomiting. 20 tablet 0  . ondansetron (ZOFRAN) 4 MG tablet Take 4 mg by mouth every 8 (eight) hours as needed for nausea or vomiting.    Marland Kitchen oxyCODONE-acetaminophen (ROXICET) 5-325 MG tablet Take 1 tablet by mouth every 6 (six) hours as needed. 20 tablet 0  . rosuvastatin (CRESTOR) 5 MG tablet Take 1 tablet (5 mg total) by  mouth daily. 90 tablet 3  . tiotropium (SPIRIVA) 18 MCG inhalation capsule Place 1 capsule (18 mcg total) into inhaler and inhale daily. 90 capsule 4  . montelukast (SINGULAIR) 10 MG tablet Take 1 tablet (10 mg total) by mouth at bedtime. (Patient taking differently: Take 10 mg by mouth at bedtime. ) 30 tablet 12  . RABEprazole (ACIPHEX) 20 MG tablet Take 20 mg by mouth daily.     . sucralfate (CARAFATE) 1 g tablet Take 1 g by mouth 2 (two) times daily.      No current  facility-administered medications for this visit.       REVIEW OF SYSTEMS (Negative unless checked)  Constitutional: [] Weight loss  [] Fever  [] Chills Cardiac: [] Chest pain   [] Chest pressure   [] Palpitations   [] Shortness of breath when laying flat   [] Shortness of breath at rest   [x] Shortness of breath with exertion. Vascular:  [] Pain in legs with walking   [] Pain in legs at rest   [] Pain in legs when laying flat   [] Claudication   [] Pain in feet when walking  [] Pain in feet at rest  [] Pain in feet when laying flat   [] History of DVT   [] Phlebitis   [] Swelling in legs   [] Varicose veins   [] Non-healing ulcers Pulmonary:   [] Uses home oxygen   [] Productive cough   [] Hemoptysis   [] Wheeze  [] COPD   [] Asthma Neurologic:  [x] Dizziness  [] Blackouts   [] Seizures   [] History of stroke   [] History of TIA  [] Aphasia   [] Temporary blindness   [] Dysphagia   [] Weakness or numbness in arms   [] Weakness or numbness in legs Musculoskeletal:  [x] Arthritis   [] Joint swelling   [] Joint pain   [] Low back pain Hematologic:  [] Easy bruising  [] Easy bleeding   [] Hypercoagulable state   [] Anemic  [] Hepatitis Gastrointestinal:  [] Blood in stool   [] Vomiting blood  [] Gastroesophageal reflux/heartburn   [x] Abdominal pain Genitourinary:  [] Chronic kidney disease   [] Difficult urination  [] Frequent urination  [] Burning with urination   [] Hematuria Skin:  [] Rashes   [] Ulcers   [] Wounds Psychological:  [] History of anxiety   []  History of major  depression.    Physical Exam BP 122/70   Pulse 82   Resp 16   Ht 5' 5"  (1.651 m)   Wt 157 lb 9.6 oz (71.5 kg)   BMI 26.23 kg/m  Gen:  WD/WN, NAD Head: [place]/AT, No temporalis wasting.  Ear/Nose/Throat: Hearing grossly intact, nares w/o erythema or drainage, oropharynx w/o Erythema/Exudate Eyes: Conjunctiva clear, sclera non-icteric  Neck: trachea midline.  No bruit or JVD.  Pulmonary:  Good air movement, clear to auscultation bilaterally.  Cardiac: RRR, normal S1, S2, no Murmurs, rubs or gallops. Vascular:  Vessel Right Left  Radial Palpable Palpable  Ulnar Palpable Palpable  Brachial Palpable Palpable  Carotid Palpable, without bruit Palpable, without bruit  Aorta Not palpable N/A  Femoral Palpable Palpable  Popliteal Palpable Palpable  PT Palpable Palpable  DP Palpable Palpable   Gastrointestinal: soft, non-tender/non-distended.  Musculoskeletal: M/S 5/5 throughout.  Extremities without ischemic changes.  No deformity or atrophy. No edema. Neurologic: Sensation grossly intact in extremities.  Symmetrical.  Speech is fluent. Motor exam as listed above. Psychiatric: Judgment intact, Mood & affect appropriate for pt's clinical situation. Dermatologic: No rashes or ulcers noted.  No cellulitis or open wounds. Lymph : No Cervical, Axillary, or Inguinal lymphadenopathy.   Radiology Ct Chest Wo Contrast  Result Date: 08/08/2016 CLINICAL DATA:  Follow-up of pulmonary nodule. EXAM: CT CHEST WITHOUT CONTRAST TECHNIQUE: Multidetector CT imaging of the chest was performed following the standard protocol without IV contrast. COMPARISON:  CT scans dated 08/05/2015, 03/02/2015 and 08/20/2014 FINDINGS: Cardiovascular: Aortic atherosclerosis. Coronary artery calcifications. Mediastinum/Nodes: No enlarged mediastinal or axillary lymph nodes. Thyroid gland, trachea, and esophagus demonstrate no significant findings. Lungs/Pleura: There is a stable is irregular 6 x 4 mm density in the right  upper lobe medially best seen on image 25 of series 3, unchanged since 08/20/2014. The patient has irregular bilateral apical pleural thickening, more prominent on the right than  the left. This is stable. There are several other small areas of pleural thickening in the right hemithorax, also stable. Left lung is clear. Upper Abdomen: No acute abnormality. Musculoskeletal: No chest wall mass or suspicious bone lesions identified. IMPRESSION: Stable 6 mm nodule in the right upper lobe medially. No further follow-up is recommended. Aortic and coronary atherosclerosis. Otherwise negative exam. Electronically Signed   By: Lorriane Shire M.D.   On: 08/08/2016 08:59   Ct Renal Stone Study  Result Date: 08/06/2016 CLINICAL DATA:  Acute onset of severe left-sided abdominal pain and constipation. Initial encounter. EXAM: CT ABDOMEN AND PELVIS WITHOUT CONTRAST TECHNIQUE: Multidetector CT imaging of the abdomen and pelvis was performed following the standard protocol without IV contrast. COMPARISON:  CT of the abdomen and pelvis performed 09/12/2011 FINDINGS: Lower chest: Scattered coronary artery calcifications are seen. The visualized lung bases are grossly clear. Hepatobiliary: The liver is unremarkable in appearance. The gallbladder is unremarkable in appearance. The common bile duct remains normal in caliber. Pancreas: The pancreas is within normal limits. Spleen: The spleen is unremarkable in appearance. Adrenals/Urinary Tract: The adrenal glands are unremarkable in appearance. A cyst is noted at the lower pole of the right kidney. A 3 mm nonobstructing stone is noted at the interpole region of the left kidney. A smaller left renal cyst is suggested. There is no evidence of hydronephrosis. No obstructing ureteral stones are seen. No significant perinephric stranding is appreciated. Stomach/Bowel: The stomach is unremarkable in appearance. The small bowel is within normal limits. The appendix is normal in caliber,  without evidence of appendicitis. Trace free fluid is seen along the left paracolic gutter. Diverticulosis is noted along the distal descending and sigmoid colon. There is question of mild wall thickening at the distal descending colon. This could reflect very mild diverticulitis. Vascular/Lymphatic: There is aneurysmal dilatation of the proximal abdominal aorta to 3.5 cm in AP dimension at the level of the diaphragm. Scattered calcification is noted along the abdominal aorta and its branches. There is mild ectasia of the distal abdominal aorta. No retroperitoneal or pelvic sidewall lymphadenopathy is seen. Reproductive: The bladder is mildly distended and grossly unremarkable. The patient is status post hysterectomy. The ovaries are symmetric. No suspicious adnexal masses are seen. Other: No additional soft tissue abnormalities are seen. Musculoskeletal: No acute osseous abnormalities are identified. Endplate sclerotic change and vacuum phenomenon are noted at L3-L4. Mild underlying facet disease is noted along the lumbar spine. The visualized musculature is unremarkable in appearance. IMPRESSION: 1. Question of mild wall thickening at the distal descending colon, with trace free fluid along the left paracolic gutter, and diverticulosis at the distal descending and sigmoid colon. This could reflect very mild acute diverticulitis. No evidence of perforation or abscess formation. 2. **An incidental finding of potential clinical significance has been found. Aneurysmal dilatation of the proximal abdominal aorta to 3.5 cm in AP dimension at the level of the diaphragm. Recommend followup by ultrasound in 2 years. This recommendation follows ACR consensus guidelines: White Paper of the ACR Incidental Findings Committee II on Vascular Findings. J Am Coll Radiol 2013; 10:789-794.** 3. Scattered aortic atherosclerosis. 4. Scattered coronary artery calcifications seen. 5. Bilateral renal cysts noted. 3 mm nonobstructing stone  at the interpole region of the left kidney. Electronically Signed   By: Garald Balding M.D.   On: 08/06/2016 21:26    Labs Recent Results (from the past 2160 hour(s))  Basic metabolic panel     Status: Abnormal   Collection Time: 06/16/16  5:52 PM  Result Value Ref Range   Sodium 137 135 - 145 mmol/L   Potassium 4.7 3.5 - 5.1 mmol/L   Chloride 103 101 - 111 mmol/L   CO2 26 22 - 32 mmol/L   Glucose, Bld 130 (H) 65 - 99 mg/dL   BUN 23 (H) 6 - 20 mg/dL   Creatinine, Ser 1.37 (H) 0.44 - 1.00 mg/dL   Calcium 9.8 8.9 - 10.3 mg/dL   GFR calc non Af Amer 38 (L) >60 mL/min   GFR calc Af Amer 44 (L) >60 mL/min    Comment: (NOTE) The eGFR has been calculated using the CKD EPI equation. This calculation has not been validated in all clinical situations. eGFR's persistently <60 mL/min signify possible Chronic Kidney Disease.    Anion gap 8 5 - 15  CBC     Status: Abnormal   Collection Time: 06/16/16  5:52 PM  Result Value Ref Range   WBC 7.8 3.6 - 11.0 K/uL   RBC 4.72 3.80 - 5.20 MIL/uL   Hemoglobin 10.7 (L) 12.0 - 16.0 g/dL   HCT 33.4 (L) 35.0 - 47.0 %   MCV 70.8 (L) 80.0 - 100.0 fL   MCH 22.6 (L) 26.0 - 34.0 pg   MCHC 31.9 (L) 32.0 - 36.0 g/dL   RDW 16.5 (H) 11.5 - 14.5 %   Platelets 255 150 - 440 K/uL  Troponin I     Status: None   Collection Time: 06/16/16  5:52 PM  Result Value Ref Range   Troponin I <0.03 <0.03 ng/mL  Basic metabolic panel     Status: Abnormal   Collection Time: 06/18/16  1:04 AM  Result Value Ref Range   Sodium 138 135 - 145 mmol/L   Potassium 4.0 3.5 - 5.1 mmol/L   Chloride 104 101 - 111 mmol/L   CO2 26 22 - 32 mmol/L   Glucose, Bld 136 (H) 65 - 99 mg/dL   BUN 25 (H) 6 - 20 mg/dL   Creatinine, Ser 1.31 (H) 0.44 - 1.00 mg/dL   Calcium 9.9 8.9 - 10.3 mg/dL   GFR calc non Af Amer 41 (L) >60 mL/min   GFR calc Af Amer 47 (L) >60 mL/min    Comment: (NOTE) The eGFR has been calculated using the CKD EPI equation. This calculation has not been validated in  all clinical situations. eGFR's persistently <60 mL/min signify possible Chronic Kidney Disease.    Anion gap 8 5 - 15  HM DIABETES EYE EXAM     Status: None   Collection Time: 06/29/16 12:00 AM  Result Value Ref Range   HM Diabetic Eye Exam No Retinopathy No Retinopathy  Glucose, capillary     Status: None   Collection Time: 07/19/16  7:13 AM  Result Value Ref Range   Glucose-Capillary 91 65 - 99 mg/dL  Surgical pathology     Status: None   Collection Time: 07/19/16  7:49 AM  Result Value Ref Range   SURGICAL PATHOLOGY      Surgical Pathology CASE: [id number] PATIENT: Jetty Aplin Surgical Pathology Report     SPECIMEN SUBMITTED: A. Stomach, antrum; cbx B. Stomach, body; cbx C. GEJ; cbx  CLINICAL HISTORY: None provided  PRE-OPERATIVE DIAGNOSIS: HX Barrett's esophagus  POST-OPERATIVE DIAGNOSIS: Esophageal candida, gastritis, small sliding hiatal hernia     DIAGNOSIS: A. STOMACH, ANTRUM; COLD BIOPSY: - MILD REACTIVE GASTROPATHY. - NEGATIVE FOR H. PYLORI, DYSPLASIA AND MALIGNANCY.  B.  STOMACH, BODY; COLD BIOPSY: - MINIMAL CHRONIC GASTRITIS  AND PROTON PUMP INHIBITOR EFFECT. - NEGATIVE FOR H. PYLORI, DYSPLASIA AND MALIGNANCY.  C.  GE JUNCTION; COLD BIOPSY: - REFLUX GASTROESOPHAGITIS AND BARRETT'S ESOPHAGUS. - NEGATIVE FOR DYSPLASIA AND MALIGNANCY.   GROSS DESCRIPTION:  A. Labeled: antrum C BX  Tissue fragment(s): 2  Size: 0.2-0.3 cm  Description: pink fragment  Entirely submitted in 1 cassette(s).   B. Labeled: body of stomach C BX  Tissue fragment(s) : 2  Size: 0.3-0.4 cm  Description: pink fragment  Entirely submitted in 1 cassette(s).   C. Labeled: GEJ C BX  Tissue fragment(s): multiple  Size: aggregate, 0.8 x 0.3 x 0.1 cm  Description: pink to tan fragments  Entirely submitted in 1 cassette(s).  Final Diagnosis performed by Delorse Lek, MD.  Electronically signed 07/20/2016 4:10:35PM    The electronic signature  indicates that the named Attending Pathologist has evaluated the specimen  Technical component performed at [facility], [address], [place], [place] [address] Lab: [phone] Dir: Darrick Penna. Evette Doffing, MD  Professional component performed at [facility], [facility], [place], [place], [place] [address] Lab: [phone] Dir: Dellia Nims. Rubinas, MD    KOH prep     Status: None   Collection Time: 07/19/16  7:57 AM  Result Value Ref Range   Specimen Description BRONCH BRUSH TIP    Special Requests NONE    KOH Prep BUDDING YEAST SEEN    Report Status 07/19/2016 FINAL   Lipase, blood     Status: Abnormal   Collection Time: 08/06/16  7:50 PM  Result Value Ref Range   Lipase 52 (H) 11 - 51 U/L  Comprehensive metabolic panel     Status: Abnormal   Collection Time: 08/06/16  7:50 PM  Result Value Ref Range   Sodium 136 135 - 145 mmol/L   Potassium 3.4 (L) 3.5 - 5.1 mmol/L   Chloride 101 101 - 111 mmol/L   CO2 25 22 - 32 mmol/L   Glucose, Bld 128 (H) 65 - 99 mg/dL   BUN 29 (H) 6 - 20 mg/dL   Creatinine, Ser 1.40 (H) 0.44 - 1.00 mg/dL   Calcium 10.1 8.9 - 10.3 mg/dL   Total Protein 7.8 6.5 - 8.1 g/dL   Albumin 4.1 3.5 - 5.0 g/dL   AST 22 15 - 41 U/L   ALT 13 (L) 14 - 54 U/L   Alkaline Phosphatase 88 38 - 126 U/L   Total Bilirubin 0.5 0.3 - 1.2 mg/dL   GFR calc non Af Amer 37 (L) >60 mL/min   GFR calc Af Amer 43 (L) >60 mL/min    Comment: (NOTE) The eGFR has been calculated using the CKD EPI equation. This calculation has not been validated in all clinical situations. eGFR's persistently <60 mL/min signify possible Chronic Kidney Disease.    Anion gap 10 5 - 15  CBC     Status: Abnormal   Collection Time: 08/06/16  7:50 PM  Result Value Ref Range   WBC 13.3 (H) 3.6 - 11.0 K/uL   RBC 5.26 (H) 3.80 - 5.20 MIL/uL   Hemoglobin 11.5 (L) 12.0 - 16.0 g/dL   HCT 37.2 35.0 - 47.0 %   MCV 70.7 (L) 80.0 - 100.0 fL   MCH 21.9 (L) 26.0 - 34.0 pg   MCHC 31.0 (L) 32.0  - 36.0 g/dL   RDW 20.1 (H) 11.5 - 14.5 %   Platelets 257 150 - 440 K/uL    Assessment/Plan:  Chronic kidney disease, stage 3 Try to avoid  contrast with evaluation as much as possible. Plan duplex  Hyperlipidemia lipid control important in reducing the progression of atherosclerotic disease. Continue statin therapy   Smoking We had a discussion for approximately 3-4 minutes regarding the absolute need for smoking cessation due to the deleterious nature of tobacco on the vascular system. Particularly in her case, it increase likelihood an aneurysmal growth. We discussed the tobacco use would diminish patency of any intervention, and likely significantly worsen progressio of disease. We discussed multiple agents for quitting including replacement therapy or medications to reduce cravings such as Chantix. The patient voices their understanding of the importance of smoking cessation.   Benign essential HTN blood pressure control important in reducing the progression of atherosclerotic disease and aneurysmal degeneration. On appropriate oral medications.   AAA (abdominal aortic aneurysm) without rupture ([place]) I have independently reviewed her CT scan and this demonstrates a 3.5 cm AAA just above and at the visceral vessels. This is a very difficult location for aneurysmal repair, but fortunately recurrent is well below the threshold size for prophylactic repair. I have discussed the pathophysiology and natural history of aneurysms. In this location general threshold would likely be around 5.5 cm in maximal diameter. Smoking cessation and blood pressure control or of paramount importance. As this was the first evaluation, we will plan a repeat duplex in about 6 months. If this remains stable in size, likely be checked on an annual basis following that.      Leotis Pain 08/12/2016, 2:21 PM   This note was created with Dragon medical transcription system.  Any errors from dictation are  unintentional.

## 2016-08-12 NOTE — Assessment & Plan Note (Signed)
lipid control important in reducing the progression of atherosclerotic disease. Continue statin therapy  

## 2016-08-12 NOTE — Assessment & Plan Note (Signed)
blood pressure control important in reducing the progression of atherosclerotic disease and aneurysmal degeneration. On appropriate oral medications.  

## 2016-08-12 NOTE — Assessment & Plan Note (Signed)
I have independently reviewed her CT scan and this demonstrates a 3.5 cm AAA just above and at the visceral vessels. This is a very difficult location for aneurysmal repair, but fortunately recurrent is well below the threshold size for prophylactic repair. I have discussed the pathophysiology and natural history of aneurysms. In this location general threshold would likely be around 5.5 cm in maximal diameter. Smoking cessation and blood pressure control or of paramount importance. As this was the first evaluation, we will plan a repeat duplex in about 6 months. If this remains stable in size, likely be checked on an annual basis following that.

## 2016-08-12 NOTE — Assessment & Plan Note (Signed)
We had a discussion for approximately 3-4 minutes regarding the absolute need for smoking cessation due to the deleterious nature of tobacco on the vascular system. Particularly in her case, it increase likelihood an aneurysmal growth. We discussed the tobacco use would diminish patency of any intervention, and likely significantly worsen progressio of disease. We discussed multiple agents for quitting including replacement therapy or medications to reduce cravings such as Chantix. The patient voices their understanding of the importance of smoking cessation.

## 2016-08-12 NOTE — Assessment & Plan Note (Signed)
>>  ASSESSMENT AND PLAN FOR TOBACCO USE DISORDER WRITTEN ON 08/12/2016  1:46 PM BY DEW, Marlow Baars, MD  We had a discussion for approximately 3-4 minutes regarding the absolute need for smoking cessation due to the deleterious nature of tobacco on the vascular system. Particularly in her case, it increase likelihood an aneurysmal growth. We discussed the tobacco use would diminish patency of any intervention, and likely significantly worsen progressio of disease. We discussed multiple agents for quitting including replacement therapy or medications to reduce cravings such as Chantix. The patient voices their understanding of the importance of smoking cessation.

## 2016-08-12 NOTE — Patient Instructions (Signed)
Abdominal Aortic Aneurysm Blood pumps away from the heart through tubes (blood vessels) called arteries. Aneurysms are weak or damaged places in the wall of an artery. It bulges out like a balloon. An abdominal aortic aneurysm happens in the main artery of the body (aorta). It can burst or tear, causing bleeding inside the body. This is an emergency. It needs treatment right away. What are the causes? The exact cause is unknown. Things that could cause this problem include:  Fat and other substances building up in the lining of a tube.  Swelling of the walls of a blood vessel.  Certain tissue diseases.  Belly (abdominal) trauma.  An infection in the main artery of the body.  What increases the risk? There are things that make it more likely for you to have an aneurysm. These include:  Being over the age of 70 years old.  Having high blood pressure (hypertension).  Being a female.  Being white.  Being very overweight (obese).  Having a family history of aneurysm.  Using tobacco products.  What are the signs or symptoms? Symptoms depend on the size of the aneurysm and how fast it grows. There may not be symptoms. If symptoms occur, they can include:  Pain (belly, side, lower back, or groin).  Feeling full after eating a small amount of food.  Feeling sick to your stomach (nauseous), throwing up (vomiting), or both.  Feeling a lump in your belly that feels like it is beating (pulsating).  Feeling like you will pass out (faint).  How is this treated?  Medicine to control blood pressure and pain.  Imaging tests to see if the aneurysm gets bigger.  Surgery. How is this prevented? To lessen your chance of getting this condition:  Stop smoking. Stop chewing tobacco.  Limit or avoid alcohol.  Keep your blood pressure, blood sugar, and cholesterol within normal limits.  Eat less salt.  Eat foods low in saturated fats and cholesterol. These are found in animal and  whole dairy products.  Eat more fiber. Fiber is found in whole grains, vegetables, and fruits.  Keep a healthy weight.  Stay active and exercise often.  This information is not intended to replace advice given to you by your health care provider. Make sure you discuss any questions you have with your health care provider. Document Released: 06/04/2012 Document Revised: 07/16/2015 Document Reviewed: 03/09/2012 Elsevier Interactive Patient Education  2017 Elsevier Inc.  

## 2016-08-22 DIAGNOSIS — S91209A Unspecified open wound of unspecified toe(s) with damage to nail, initial encounter: Secondary | ICD-10-CM | POA: Diagnosis not present

## 2016-09-27 DIAGNOSIS — R809 Proteinuria, unspecified: Secondary | ICD-10-CM | POA: Diagnosis not present

## 2016-09-27 DIAGNOSIS — E1121 Type 2 diabetes mellitus with diabetic nephropathy: Secondary | ICD-10-CM | POA: Diagnosis not present

## 2016-09-27 DIAGNOSIS — Z794 Long term (current) use of insulin: Secondary | ICD-10-CM | POA: Diagnosis not present

## 2016-09-27 DIAGNOSIS — E1122 Type 2 diabetes mellitus with diabetic chronic kidney disease: Secondary | ICD-10-CM | POA: Diagnosis not present

## 2016-09-27 DIAGNOSIS — N183 Chronic kidney disease, stage 3 (moderate): Secondary | ICD-10-CM | POA: Diagnosis not present

## 2016-09-27 DIAGNOSIS — N2581 Secondary hyperparathyroidism of renal origin: Secondary | ICD-10-CM | POA: Diagnosis not present

## 2016-09-27 DIAGNOSIS — Z720 Tobacco use: Secondary | ICD-10-CM | POA: Diagnosis not present

## 2016-09-27 DIAGNOSIS — E781 Pure hyperglyceridemia: Secondary | ICD-10-CM | POA: Diagnosis not present

## 2016-09-27 DIAGNOSIS — E785 Hyperlipidemia, unspecified: Secondary | ICD-10-CM | POA: Diagnosis not present

## 2016-09-27 DIAGNOSIS — I129 Hypertensive chronic kidney disease with stage 1 through stage 4 chronic kidney disease, or unspecified chronic kidney disease: Secondary | ICD-10-CM | POA: Diagnosis not present

## 2016-09-29 DIAGNOSIS — E1122 Type 2 diabetes mellitus with diabetic chronic kidney disease: Secondary | ICD-10-CM | POA: Diagnosis not present

## 2016-09-29 DIAGNOSIS — N183 Chronic kidney disease, stage 3 (moderate): Secondary | ICD-10-CM | POA: Diagnosis not present

## 2016-09-29 DIAGNOSIS — R809 Proteinuria, unspecified: Secondary | ICD-10-CM | POA: Diagnosis not present

## 2016-09-29 DIAGNOSIS — I129 Hypertensive chronic kidney disease with stage 1 through stage 4 chronic kidney disease, or unspecified chronic kidney disease: Secondary | ICD-10-CM | POA: Diagnosis not present

## 2016-09-29 DIAGNOSIS — N2581 Secondary hyperparathyroidism of renal origin: Secondary | ICD-10-CM | POA: Diagnosis not present

## 2016-10-05 ENCOUNTER — Encounter: Payer: Self-pay | Admitting: Family Medicine

## 2016-10-05 ENCOUNTER — Encounter: Payer: Self-pay | Admitting: Cardiovascular Disease

## 2016-10-05 ENCOUNTER — Other Ambulatory Visit: Payer: Self-pay | Admitting: Family Medicine

## 2016-10-05 DIAGNOSIS — M48061 Spinal stenosis, lumbar region without neurogenic claudication: Secondary | ICD-10-CM

## 2016-10-05 DIAGNOSIS — J449 Chronic obstructive pulmonary disease, unspecified: Secondary | ICD-10-CM

## 2016-10-05 MED ORDER — TIOTROPIUM BROMIDE MONOHYDRATE 18 MCG IN CAPS: 18.0000 ug | ORAL_CAPSULE | Freq: Every day | RESPIRATORY_TRACT | 4 refills | Status: DC

## 2016-10-07 MED ORDER — HYDROCODONE-ACETAMINOPHEN 5-325 MG PO TABS: 1.0000 | ORAL_TABLET | Freq: Four times a day (QID) | ORAL | 0 refills | Status: DC | PRN

## 2016-10-07 MED ORDER — HYDROCODONE-ACETAMINOPHEN 5-325 MG PO TABS
1.0000 | ORAL_TABLET | Freq: Four times a day (QID) | ORAL | 0 refills | Status: DC | PRN
Start: 2016-10-07 — End: 2016-11-08

## 2016-10-13 ENCOUNTER — Other Ambulatory Visit: Payer: Self-pay | Admitting: *Deleted

## 2016-10-13 DIAGNOSIS — I10 Essential (primary) hypertension: Secondary | ICD-10-CM

## 2016-10-13 DIAGNOSIS — E7849 Other hyperlipidemia: Secondary | ICD-10-CM

## 2016-10-29 ENCOUNTER — Observation Stay
Admission: EM | Admit: 2016-10-29 | Discharge: 2016-10-30 | Disposition: A | Discharge status: Home / Self Care | Payer: Medicare Other | Attending: Internal Medicine | Admitting: Internal Medicine

## 2016-10-29 ENCOUNTER — Emergency Department: Payer: Medicare Other

## 2016-10-29 ENCOUNTER — Telehealth: Payer: Self-pay | Admitting: Internal Medicine

## 2016-10-29 DIAGNOSIS — E669 Obesity, unspecified: Secondary | ICD-10-CM | POA: Diagnosis not present

## 2016-10-29 DIAGNOSIS — I2510 Atherosclerotic heart disease of native coronary artery without angina pectoris: Secondary | ICD-10-CM | POA: Insufficient documentation

## 2016-10-29 DIAGNOSIS — N1832 Chronic kidney disease, stage 3b: Secondary | ICD-10-CM | POA: Diagnosis present

## 2016-10-29 DIAGNOSIS — M419 Scoliosis, unspecified: Secondary | ICD-10-CM | POA: Insufficient documentation

## 2016-10-29 DIAGNOSIS — I639 Cerebral infarction, unspecified: Secondary | ICD-10-CM | POA: Diagnosis not present

## 2016-10-29 DIAGNOSIS — Z90710 Acquired absence of both cervix and uterus: Secondary | ICD-10-CM | POA: Insufficient documentation

## 2016-10-29 DIAGNOSIS — I10 Essential (primary) hypertension: Secondary | ICD-10-CM

## 2016-10-29 DIAGNOSIS — I6523 Occlusion and stenosis of bilateral carotid arteries: Secondary | ICD-10-CM | POA: Insufficient documentation

## 2016-10-29 DIAGNOSIS — I714 Abdominal aortic aneurysm, without rupture: Secondary | ICD-10-CM | POA: Insufficient documentation

## 2016-10-29 DIAGNOSIS — Z833 Family history of diabetes mellitus: Secondary | ICD-10-CM | POA: Insufficient documentation

## 2016-10-29 DIAGNOSIS — Z885 Allergy status to narcotic agent status: Secondary | ICD-10-CM | POA: Insufficient documentation

## 2016-10-29 DIAGNOSIS — K219 Gastro-esophageal reflux disease without esophagitis: Secondary | ICD-10-CM | POA: Diagnosis present

## 2016-10-29 DIAGNOSIS — I129 Hypertensive chronic kidney disease with stage 1 through stage 4 chronic kidney disease, or unspecified chronic kidney disease: Secondary | ICD-10-CM | POA: Insufficient documentation

## 2016-10-29 DIAGNOSIS — Z955 Presence of coronary angioplasty implant and graft: Secondary | ICD-10-CM | POA: Diagnosis not present

## 2016-10-29 DIAGNOSIS — D631 Anemia in chronic kidney disease: Secondary | ICD-10-CM | POA: Insufficient documentation

## 2016-10-29 DIAGNOSIS — M5416 Radiculopathy, lumbar region: Secondary | ICD-10-CM | POA: Diagnosis not present

## 2016-10-29 DIAGNOSIS — J309 Allergic rhinitis, unspecified: Secondary | ICD-10-CM | POA: Insufficient documentation

## 2016-10-29 DIAGNOSIS — R29898 Other symptoms and signs involving the musculoskeletal system: Secondary | ICD-10-CM

## 2016-10-29 DIAGNOSIS — K2270 Barrett's esophagus without dysplasia: Secondary | ICD-10-CM | POA: Diagnosis not present

## 2016-10-29 DIAGNOSIS — E1122 Type 2 diabetes mellitus with diabetic chronic kidney disease: Secondary | ICD-10-CM | POA: Diagnosis present

## 2016-10-29 DIAGNOSIS — Z7902 Long term (current) use of antithrombotics/antiplatelets: Secondary | ICD-10-CM | POA: Insufficient documentation

## 2016-10-29 DIAGNOSIS — M1990 Unspecified osteoarthritis, unspecified site: Secondary | ICD-10-CM | POA: Diagnosis not present

## 2016-10-29 DIAGNOSIS — Z801 Family history of malignant neoplasm of trachea, bronchus and lung: Secondary | ICD-10-CM | POA: Insufficient documentation

## 2016-10-29 DIAGNOSIS — J449 Chronic obstructive pulmonary disease, unspecified: Secondary | ICD-10-CM | POA: Diagnosis not present

## 2016-10-29 DIAGNOSIS — Z6826 Body mass index (BMI) 26.0-26.9, adult: Secondary | ICD-10-CM | POA: Insufficient documentation

## 2016-10-29 DIAGNOSIS — R531 Weakness: Principal | ICD-10-CM | POA: Insufficient documentation

## 2016-10-29 DIAGNOSIS — Z888 Allergy status to other drugs, medicaments and biological substances status: Secondary | ICD-10-CM | POA: Insufficient documentation

## 2016-10-29 DIAGNOSIS — F17210 Nicotine dependence, cigarettes, uncomplicated: Secondary | ICD-10-CM | POA: Diagnosis not present

## 2016-10-29 DIAGNOSIS — E1121 Type 2 diabetes mellitus with diabetic nephropathy: Secondary | ICD-10-CM | POA: Insufficient documentation

## 2016-10-29 DIAGNOSIS — Z79899 Other long term (current) drug therapy: Secondary | ICD-10-CM | POA: Insufficient documentation

## 2016-10-29 DIAGNOSIS — Z823 Family history of stroke: Secondary | ICD-10-CM | POA: Insufficient documentation

## 2016-10-29 DIAGNOSIS — Z882 Allergy status to sulfonamides status: Secondary | ICD-10-CM | POA: Insufficient documentation

## 2016-10-29 DIAGNOSIS — N183 Chronic kidney disease, stage 3 (moderate): Secondary | ICD-10-CM | POA: Insufficient documentation

## 2016-10-29 DIAGNOSIS — M48061 Spinal stenosis, lumbar region without neurogenic claudication: Secondary | ICD-10-CM | POA: Diagnosis not present

## 2016-10-29 DIAGNOSIS — D71 Functional disorders of polymorphonuclear neutrophils: Secondary | ICD-10-CM | POA: Diagnosis not present

## 2016-10-29 DIAGNOSIS — Z7982 Long term (current) use of aspirin: Secondary | ICD-10-CM | POA: Insufficient documentation

## 2016-10-29 DIAGNOSIS — Z803 Family history of malignant neoplasm of breast: Secondary | ICD-10-CM | POA: Insufficient documentation

## 2016-10-29 DIAGNOSIS — Z8249 Family history of ischemic heart disease and other diseases of the circulatory system: Secondary | ICD-10-CM | POA: Insufficient documentation

## 2016-10-29 DIAGNOSIS — E1159 Type 2 diabetes mellitus with other circulatory complications: Secondary | ICD-10-CM | POA: Diagnosis present

## 2016-10-29 DIAGNOSIS — Z794 Long term (current) use of insulin: Secondary | ICD-10-CM | POA: Insufficient documentation

## 2016-10-29 DIAGNOSIS — E7849 Other hyperlipidemia: Secondary | ICD-10-CM | POA: Diagnosis present

## 2016-10-29 DIAGNOSIS — E785 Hyperlipidemia, unspecified: Secondary | ICD-10-CM | POA: Diagnosis present

## 2016-10-29 HISTORY — DX: Cerebral infarction, unspecified: I63.9

## 2016-10-29 LAB — COMPREHENSIVE METABOLIC PANEL
ALK PHOS: 126 U/L (ref 38–126)
ALT: 17 U/L (ref 14–54)
AST: 26 U/L (ref 15–41)
Albumin: 4.3 g/dL (ref 3.5–5.0)
Anion gap: 10 (ref 5–15)
BUN: 27 mg/dL — AB (ref 6–20)
CHLORIDE: 104 mmol/L (ref 101–111)
CO2: 25 mmol/L (ref 22–32)
Calcium: 10.1 mg/dL (ref 8.9–10.3)
Creatinine, Ser: 1.17 mg/dL — ABNORMAL HIGH (ref 0.44–1.00)
GFR calc Af Amer: 53 mL/min — ABNORMAL LOW (ref >60)
GFR calc non Af Amer: 46 mL/min — ABNORMAL LOW (ref >60)
GLUCOSE: 107 mg/dL — AB (ref 65–99)
Potassium: 3.7 mmol/L (ref 3.5–5.1)
Sodium: 139 mmol/L (ref 135–145)
Total Bilirubin: 0.5 mg/dL (ref 0.3–1.2)
Total Protein: 7.8 g/dL (ref 6.5–8.1)

## 2016-10-29 LAB — DIFFERENTIAL
BASOS PCT: 0 %
Basophils Absolute: 0 10*3/uL (ref 0–0.1)
EOS PCT: 3 %
Eosinophils Absolute: 0.4 10*3/uL (ref 0–0.7)
LYMPHS PCT: 29 %
Lymphs Abs: 4.1 10*3/uL — ABNORMAL HIGH (ref 1.0–3.6)
MONO ABS: 1.3 10*3/uL — AB (ref 0.2–0.9)
MONOS PCT: 9 %
NEUTROS ABS: 8.4 10*3/uL — AB (ref 1.4–6.5)
Neutrophils Relative %: 59 %

## 2016-10-29 LAB — CBC
HEMATOCRIT: 38.2 % (ref 35.0–47.0)
HEMOGLOBIN: 12.8 g/dL (ref 12.0–16.0)
MCH: 24.3 pg — ABNORMAL LOW (ref 26.0–34.0)
MCHC: 33.4 g/dL (ref 32.0–36.0)
MCV: 72.7 fL — ABNORMAL LOW (ref 80.0–100.0)
Platelets: 249 10*3/uL (ref 150–440)
RBC: 5.26 MIL/uL — AB (ref 3.80–5.20)
RDW: 21.3 % — ABNORMAL HIGH (ref 11.5–14.5)
WBC: 14.2 10*3/uL — AB (ref 3.6–11.0)

## 2016-10-29 LAB — APTT: APTT: 33 s (ref 24–36)

## 2016-10-29 LAB — PROTIME-INR
INR: 0.94
Prothrombin Time: 12.5 seconds (ref 11.4–15.2)

## 2016-10-29 LAB — TROPONIN I: Troponin I: <0.03 ng/mL (ref <0.03)

## 2016-10-29 MED ORDER — ASPIRIN 81 MG PO CHEW
324.0000 mg | CHEWABLE_TABLET | Freq: Once | ORAL | Status: AC
  Administered 2016-10-30: 243 mg via ORAL
  Filled 2016-10-29: qty 4

## 2016-10-29 NOTE — ED Provider Notes (Signed)
[facility] Emergency Department Provider Note   ____________________________________________   First MD Initiated Contact with Patient 10/29/16 2349     (approximate)  I have reviewed the triage vital signs and the nursing notes.   HISTORY  Chief Complaint Cerebrovascular Accident    HPI Hannah Kirby is a 70 y.o. female who presents to the ED from home with a chief complaint of left arm weakness. Patient has a history of CAD status post stents, on baby aspirin and Plavix,who noted left upper extremity weakness approximately 1:30 PM while driving home from [facility]. Denies slurred speech, confusion, facial droop, lower extremity involvement, numbness or tingling. Finally told her husband approximately 10 PM and they called her cardiologist on call who referred her to the ED for evaluation. Patient reports symptoms have been the same since 1:30 PM, not better and not worse. Denies associated fever, chills, headache, neck pain, vision changes, chest pain, shortness of breath, abdominal pain, nausea, vomiting. Denies recent trauma. Nothing makes her symptoms better or worse.   Past Medical History:  Diagnosis Date  . Anemia   . Arthritis   . CAD (coronary artery disease)    a. cath 02/2006: BMS x 2 to RCA, cath o/w without significant coronary disease; b. nuclear stress test 07/2014: no signs of ischemia, no ekg changes concerning for ischemia, low risk study/normal study  . Chronic bronchitis ([place])    secondary to cigarette smoking  . COPD (chronic obstructive pulmonary disease) ([place])   . Diabetes mellitus   . FHx: allergies   . GERD (gastroesophageal reflux disease)   . Goiter   . Granulomatous disease ([place])   . Hernia   . Hyperlipidemia   . Hypertension   . Kidney stone on left side 2013  . Microalbuminuria   . Obesity   . Spinal stenosis     Patient Active Problem List   Diagnosis Date Noted  . AAA (abdominal aortic aneurysm) without  rupture ([place]) 08/12/2016  . 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 Chronic kidney disease, stage 3 07/27/2015  . Chest pain at rest 05/06/2015  . 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 Solitary pulmonary nodule on lung CT 08/20/2014  . Allergic rhinitis 07/28/2014  . CAFL (chronic airflow limitation) ([place]) 07/28/2014  . Abnormal liver enzymes 07/28/2014  . Acid reflux 07/28/2014  . Head revolving around 07/28/2014  . CAD (coronary artery disease)   . Obesity   . Granulomatous disease ([place])   . Back pain 11/12/2013  . Scoliosis 10/30/2013  . Lumbar radiculopathy 10/30/2013  . Lumbar canal stenosis 10/30/2013  . Calcium blood increased 10/22/2013  . Microalbuminuria 10/22/2013  . Smoking 03/21/2011  . Obese 03/21/2011  . Edema 08/18/2010  . Hyperlipidemia 03/25/2009  . Benign essential HTN 03/25/2009    Past Surgical History:  Procedure Laterality Date  . ABDOMINAL HYSTERECTOMY    . BREAST BIOPSY    . COLONOSCOPY WITH PROPOFOL N/A 09/23/2014   Procedure: COLONOSCOPY WITH PROPOFOL;  Surgeon: Lollie Sails, MD;  Location: [facility] ENDOSCOPY;  Service: Endoscopy;  Laterality: N/A;  . CORONARY ANGIOPLASTY WITH STENT PLACEMENT  2008  . ESOPHAGOGASTRODUODENOSCOPY (EGD) WITH PROPOFOL N/A 12/30/2014   Procedure: ESOPHAGOGASTRODUODENOSCOPY (EGD) WITH PROPOFOL;  Surgeon: Lollie Sails, MD;  Location: [facility] ENDOSCOPY;  Service: Endoscopy;  Laterality: N/A;  . ESOPHAGOGASTRODUODENOSCOPY (EGD) WITH PROPOFOL N/A 07/19/2016   Procedure: ESOPHAGOGASTRODUODENOSCOPY (EGD) WITH PROPOFOL;  Surgeon: Lollie Sails, MD;  Location: [facility] ENDOSCOPY;  Service: Endoscopy;  Laterality: N/A;  .  hysterectomy (other)      Prior to Admission medications   Medication Sig Start Date End Date Taking? Authorizing Provider  albuterol (VENTOLIN HFA) 108 (90 Base) MCG/ACT inhaler Inhale 2 puffs into the lungs every 4 (four) hours as needed for wheezing or shortness of breath. 11/24/15  Yes  Birdie Sons, MD  amLODipine (NORVASC) 5 MG tablet Take 1 tablet (5 mg total) by mouth daily. 06/20/16  Yes Minna Merritts, MD  aspirin (ASPIR-81) 81 MG EC tablet Take 81 mg by mouth daily.     Yes [provider]  chlorhexidine (PERIDEX) 0.12 % solution Use as directed 15 mLs in the mouth or throat 2 (two) times daily. Patient taking differently: Use as directed 15 mLs in the mouth or throat as needed.  08/18/10  Yes Minna Merritts, MD  clopidogrel (PLAVIX) 75 MG tablet Take 1 tablet (75 mg total) by mouth daily. 11/19/15  Yes Minna Merritts, MD  ezetimibe (ZETIA) 10 MG tablet Take 1 tablet (10 mg total) by mouth daily. 06/08/16  Yes Gollan, Kathlene November, MD  fluticasone furoate-vilanterol (BREO ELLIPTA) 100-25 MCG/INH AEPB Inhale 1 puff into the lungs daily. 06/08/16  Yes Birdie Sons, MD  HYDROcodone-acetaminophen (NORCO/VICODIN) 5-325 MG tablet Take 1 tablet by mouth every 6 (six) hours as needed. 10/07/16  Yes Birdie Sons, MD  insulin glargine (LANTUS) 100 UNIT/ML injection Inject 12 Units into the skin daily.    Yes [provider]  Liraglutide (VICTOZA) 18 MG/3ML SOLN injection Inject 1.8 mg into the skin daily.    Yes [provider]  lisinopril (PRINIVIL,ZESTRIL) 40 MG tablet Take 1 tablet (40 mg total) by mouth daily. 11/19/15  Yes Minna Merritts, MD  meclizine (ANTIVERT) 25 MG tablet Take by mouth as needed.  07/06/11  Yes [provider]  metFORMIN (GLUCOPHAGE) 1000 MG tablet Take 1,000 mg by mouth 2 (two) times daily with a meal.   Yes [provider]  metoprolol tartrate (LOPRESSOR) 25 MG tablet Take 1 tablet (25 mg total) by mouth 2 (two) times daily. 11/19/15  Yes Gollan, Kathlene November, MD  montelukast (SINGULAIR) 10 MG tablet Take 1 tablet (10 mg total) by mouth at bedtime. Patient taking differently: Take 10 mg by mouth at bedtime.  04/20/16 10/29/17 Yes Birdie Sons, MD  Multiple Vitamins-Minerals (DAILY MULTI) TABS Take 1  tablet by mouth daily.    Yes [provider]  nitroGLYCERIN (NITROSTAT) 0.4 MG SL tablet Place 1 tablet (0.4 mg total) under the tongue every 5 (five) minutes as needed. 05/06/15  Yes Gollan, Kathlene November, MD  Omega-3 Fatty Acids (FISH OIL) 1000 MG CAPS Take 2 capsules by mouth 2 (two) times daily. Take 4   Yes [provider]  ondansetron (ZOFRAN ODT) 4 MG disintegrating tablet Take 1 tablet (4 mg total) by mouth every 8 (eight) hours as needed for nausea or vomiting. 08/06/16  Yes Alfred Levins, [place], MD  RABEprazole (ACIPHEX) 20 MG tablet Take 20 mg by mouth daily.  12/30/14 10/29/17 Yes [provider]  rosuvastatin (CRESTOR) 5 MG tablet Take 1 tablet (5 mg total) by mouth daily. 05/19/16  Yes Gollan, Kathlene November, MD  sucralfate (CARAFATE) 1 g tablet Take 1 g by mouth 2 (two) times daily.  12/30/14 10/29/17 Yes [provider]  tiotropium (SPIRIVA) 18 MCG inhalation capsule Place 1 capsule (18 mcg total) into inhaler and inhale daily. 10/05/16  Yes Birdie Sons, MD  oxyCODONE-acetaminophen (ROXICET) 5-325 MG tablet  Take 1 tablet by mouth every 6 (six) hours as needed. Patient not taking: Reported on 10/29/2016 08/06/16 08/06/17  Rudene Re, MD    Allergies Sulfonamide derivatives; Statins; Codeine; and Prednisone  Family History  Problem Relation Age of Onset  . Heart failure Mother   . Stroke Mother   . Diabetes Mother   . Congestive Heart Failure Mother   . Lung cancer Father   . Breast cancer Maternal Aunt     Social History Social History  Substance Use Topics  . Smoking status: Current Every Day Smoker    Packs/day: 1.00    Years: 40.00    Types: Cigarettes  . Smokeless tobacco: Never Used  . Alcohol use Yes     Comment: 1/month- wine    Review of Systems  Constitutional: No fever/chills. Eyes: No visual changes. ENT: No sore throat. Cardiovascular: Denies chest pain. Respiratory: Denies shortness of breath. Gastrointestinal: No  abdominal pain.  No nausea, no vomiting.  No diarrhea.  No constipation. Genitourinary: Negative for dysuria. Musculoskeletal: Negative for back pain. Skin: Negative for rash. Neurological: Positive for LUE focal weakness. Negative for headaches.   ____________________________________________   PHYSICAL EXAM:  VITAL SIGNS: ED Triage Vitals  Enc Vitals Group     BP 10/29/16 2301 (!) 200/99     Pulse Rate 10/29/16 2301 88     Resp 10/29/16 2301 20     Temp 10/29/16 2331 98.6 F (37 C)     Temp src --      SpO2 10/29/16 2301 95 %     Weight 10/29/16 2301 160 lb (72.6 kg)     Height 10/29/16 2301 5\' 5"  (1.651 m)     Head Circumference --      Peak Flow --      Pain Score --      Pain Loc --      Pain Edu? --      Excl. in [place]? --     Constitutional: Alert and oriented. Well appearing and in no acute distress. Eyes: Conjunctivae are normal. PERRL. EOMI. Head: Atraumatic. Nose: No congestion/rhinnorhea. Mouth/Throat: Mucous membranes are moist.  Oropharynx non-erythematous. Neck: No stridor.  No carotid bruits.  Supple neck without meningismus. Cardiovascular: Normal rate, regular rhythm. Grossly normal heart sounds.  Good peripheral circulation. Respiratory: Normal respiratory effort.  No retractions. Lungs CTAB. Gastrointestinal: Soft and nontender. No distention. No abdominal bruits. No CVA tenderness. Musculoskeletal: No lower extremity tenderness nor edema.  No joint effusions. Neurologic:  Alert and oriented 3. CN II-XII grossly intact. Normal speech and language. 4/5 LUE motor strength. Symmetrical sensation all extremities. Skin:  Skin is warm, dry and intact. No rash noted. Psychiatric: Mood and affect are normal. Speech and behavior are normal.  ____________________________________________   LABS (all labs ordered are listed, but only abnormal results are displayed)  Labs Reviewed  CBC - Abnormal; Notable for the following:       Result Value   WBC 14.2 (*)      RBC 5.26 (*)    MCV 72.7 (*)    MCH 24.3 (*)    RDW 21.3 (*)    All other components within normal limits  DIFFERENTIAL - Abnormal; Notable for the following:    Neutro Abs 8.4 (*)    Lymphs Abs 4.1 (*)    Monocytes Absolute 1.3 (*)    All other components within normal limits  COMPREHENSIVE METABOLIC PANEL - Abnormal; Notable for the following:    Glucose, Bld 107 (*)  BUN 27 (*)    Creatinine, Ser 1.17 (*)    GFR calc non Af Amer 46 (*)    GFR calc Af Amer 53 (*)    All other components within normal limits  PROTIME-INR  APTT  TROPONIN I  GLUCOSE, CAPILLARY  CBG MONITORING, ED   ____________________________________________  EKG  ED ECG REPORT I, Kristian Hazzard J, the attending physician, personally viewed and interpreted this ECG.   Date: 10/30/2016  EKG Time: 2322  Rate: 86  Rhythm: normal EKG, normal sinus rhythm  Axis: WNL  Intervals:none  ST&T Change: nonspecific  ____________________________________________  RADIOLOGY  Ct Head Wo Contrast  Result Date: 10/29/2016 CLINICAL DATA:  LEFT arm weakness beginning this afternoon. History of hypertension, hyperlipidemia, diabetes. EXAM: CT HEAD WITHOUT CONTRAST TECHNIQUE: Contiguous axial images were obtained from the base of the skull through the vertex without intravenous contrast. COMPARISON:  None. FINDINGS: BRAIN: No intraparenchymal hemorrhage, mass effect nor midline shift. The ventricles and sulci are normal for age. Patchy to confluent supratentorial white matter hypodensities including cystic changes LEFT corona radiata. No acute large vascular territory infarcts. No abnormal extra-axial fluid collections. Basal cisterns are patent. VASCULAR: Mile calcific atherosclerosis of the carotid siphons. Dolichoectatic intracranial vessels compatible with chronic hypertension. SKULL: No skull fracture. Moderate RIGHT temporomandibular osteoarthrosis. No significant scalp soft tissue swelling. SINUSES/ORBITS: The mastoid  air-cells and included paranasal sinuses are well-aerated.The included ocular globes and orbital contents are non-suspicious. OTHER: None. IMPRESSION: 1. No acute intracranial process. 2. Moderate to severe chronic small vessel ischemic disease. 3. Critical Value/emergent results were called by telephone at the time of interpretation on 10/29/2016 at 11:23 pm to Dr. Beather Arbour, who verbally acknowledged these results. Electronically Signed   By: Elon Alas M.D.   On: 10/29/2016 23:23    ____________________________________________   PROCEDURES  Procedure(s) performed: None   Interval: Initial (09/08 2325) Level of Consciousness (1a.)   : Alert, keenly responsive (09/08 2329) LOC Questions (1b. )   +: Answers both questions correctly (09/08 2329) LOC Commands (1c. )   + : Performs both tasks correctly (09/08 2329) Best Gaze (2. )  +: Normal (09/08 2325) Visual (3. )  +: No visual loss (09/08 2325) Facial Palsy (4. )    : Normal symmetrical movements (09/08 2329) Motor Arm, Left (5a. )   +: No drift (09/08 2329) Motor Arm, Right (5b. )   +: No drift (09/08 2329) Motor Leg, Left (6a. )   +: No drift (09/08 2329) Motor Leg, Right (6b. )   +: No drift (09/08 2329) Limb Ataxia (7. ): Absent (09/08 2325) Sensory (8. )   +: Normal, no sensory loss (09/08 2325) Best Language (9. )   +: No aphasia (09/08 2325) Dysarthria (10. ): Normal (09/08 2325) Extinction/Inattention (11.)   +: No Abnormality (09/08 2325)  Procedures  Critical Care performed: No  ____________________________________________   INITIAL IMPRESSION / ASSESSMENT AND PLAN / ED COURSE  Pertinent labs & imaging results that were available during my care of the patient were reviewed by me and considered in my medical decision making (see chart for details).  71 year old female with CAD, on aspirin and Plavix, who presents with mild stroke. Onset of symptoms 11.5 hours ago. This, and her low NIH stroke scale exclude the patient  from tPA. Will administer ASA and discuss with hospitalist to evaluate patient in the emergency department for admission.      ____________________________________________   FINAL CLINICAL IMPRESSION(S) / ED DIAGNOSES  Final  diagnoses:  Cerebrovascular accident (CVA), unspecified mechanism ([place])  Essential hypertension      NEW MEDICATIONS STARTED DURING THIS VISIT:  New Prescriptions   No medications on file     Note:  This document was prepared using Dragon voice recognition software and may include unintentional dictation errors.    Paulette Blanch, MD 10/30/16 [phone]

## 2016-10-29 NOTE — Telephone Encounter (Signed)
Patient is having left sided arm weakness and is unable to grasp. She was told to call 911 and go to the ED for evaluation of stroke.

## 2016-10-29 NOTE — ED Notes (Addendum)
Per Dr. Beather Arbour, cancel code stroke due to LKW outside 8hrs

## 2016-10-29 NOTE — ED Triage Notes (Signed)
Symptoms began at approximately 1:30 pm.  Left arm weakness and dizzy.

## 2016-10-30 ENCOUNTER — Observation Stay: Payer: Medicare Other

## 2016-10-30 ENCOUNTER — Observation Stay (HOSPITAL_BASED_OUTPATIENT_CLINIC_OR_DEPARTMENT_OTHER)
Admit: 2016-10-30 | Discharge: 2016-10-30 | Disposition: A | Discharge status: Home / Self Care | Payer: Medicare Other | Attending: Internal Medicine | Admitting: Internal Medicine

## 2016-10-30 DIAGNOSIS — I639 Cerebral infarction, unspecified: Secondary | ICD-10-CM | POA: Diagnosis not present

## 2016-10-30 DIAGNOSIS — F17200 Nicotine dependence, unspecified, uncomplicated: Secondary | ICD-10-CM | POA: Diagnosis not present

## 2016-10-30 DIAGNOSIS — I5030 Unspecified diastolic (congestive) heart failure: Secondary | ICD-10-CM

## 2016-10-30 DIAGNOSIS — R29898 Other symptoms and signs involving the musculoskeletal system: Secondary | ICD-10-CM | POA: Diagnosis present

## 2016-10-30 DIAGNOSIS — E119 Type 2 diabetes mellitus without complications: Secondary | ICD-10-CM | POA: Diagnosis not present

## 2016-10-30 DIAGNOSIS — R531 Weakness: Secondary | ICD-10-CM | POA: Diagnosis not present

## 2016-10-30 DIAGNOSIS — I2510 Atherosclerotic heart disease of native coronary artery without angina pectoris: Secondary | ICD-10-CM | POA: Diagnosis not present

## 2016-10-30 DIAGNOSIS — I10 Essential (primary) hypertension: Secondary | ICD-10-CM | POA: Diagnosis not present

## 2016-10-30 DIAGNOSIS — I6523 Occlusion and stenosis of bilateral carotid arteries: Secondary | ICD-10-CM | POA: Diagnosis not present

## 2016-10-30 LAB — GLUCOSE, CAPILLARY
GLUCOSE-CAPILLARY: 110 mg/dL — AB (ref 65–99)
GLUCOSE-CAPILLARY: 109 mg/dL — AB (ref 65–99)
Glucose-Capillary: 229 mg/dL — ABNORMAL HIGH (ref 65–99)
Glucose-Capillary: 116 mg/dL — ABNORMAL HIGH (ref 65–99)

## 2016-10-30 LAB — LIPID PANEL
CHOL/HDL RATIO: 2.5 ratio
Cholesterol: 129 mg/dL (ref 0–200)
HDL: 52 mg/dL (ref >40)
LDL Cholesterol: 53 mg/dL (ref 0–99)
Triglycerides: 118 mg/dL (ref <150)
VLDL: 24 mg/dL (ref 0–40)

## 2016-10-30 LAB — CBC
HCT: 36.4 % (ref 35.0–47.0)
Hemoglobin: 12 g/dL (ref 12.0–16.0)
MCH: 23.9 pg — AB (ref 26.0–34.0)
MCHC: 33 g/dL (ref 32.0–36.0)
MCV: 72.3 fL — AB (ref 80.0–100.0)
Platelets: 223 10*3/uL (ref 150–440)
RBC: 5.04 MIL/uL (ref 3.80–5.20)
RDW: 21.6 % — AB (ref 11.5–14.5)
WBC: 13.7 10*3/uL — AB (ref 3.6–11.0)

## 2016-10-30 LAB — HEMOGLOBIN A1C
HEMOGLOBIN A1C: 6.3 % — AB (ref 4.8–5.6)
MEAN PLASMA GLUCOSE: 134.11 mg/dL

## 2016-10-30 LAB — CREATININE, SERUM
Creatinine, Ser: 1.01 mg/dL — ABNORMAL HIGH (ref 0.44–1.00)
GFR calc Af Amer: >60 mL/min (ref >60)
GFR calc non Af Amer: 55 mL/min — ABNORMAL LOW (ref >60)

## 2016-10-30 LAB — ECHOCARDIOGRAM COMPLETE
HEIGHTINCHES: 65 in
WEIGHTICAEL: 2560 [oz_av]

## 2016-10-30 MED ORDER — PANTOPRAZOLE SODIUM 40 MG PO TBEC
40.0000 mg | DELAYED_RELEASE_TABLET | Freq: Every day | ORAL | Status: DC
  Administered 2016-10-30: 40 mg via ORAL
  Filled 2016-10-30: qty 1

## 2016-10-30 MED ORDER — INSULIN ASPART 100 UNIT/ML ~~LOC~~ SOLN
0.0000 [IU] | Freq: Three times a day (TID) | SUBCUTANEOUS | Status: DC
  Administered 2016-10-30: 3 [IU] via SUBCUTANEOUS
  Filled 2016-10-30: qty 1

## 2016-10-30 MED ORDER — ASPIRIN EC 81 MG PO TBEC
81.0000 mg | DELAYED_RELEASE_TABLET | Freq: Every day | ORAL | Status: DC
  Administered 2016-10-30: 11:00:00 81 mg via ORAL
  Filled 2016-10-30: qty 1

## 2016-10-30 MED ORDER — FLUTICASONE FUROATE-VILANTEROL 100-25 MCG/INH IN AEPB
1.0000 | INHALATION_SPRAY | Freq: Every day | RESPIRATORY_TRACT | Status: DC
  Administered 2016-10-30: 11:00:00 1 via RESPIRATORY_TRACT
  Filled 2016-10-30: qty 28

## 2016-10-30 MED ORDER — INSULIN ASPART 100 UNIT/ML ~~LOC~~ SOLN: 0.0000 [IU] | Freq: Every day | SUBCUTANEOUS | Status: DC

## 2016-10-30 MED ORDER — ENOXAPARIN SODIUM 40 MG/0.4ML ~~LOC~~ SOLN
40.0000 mg | SUBCUTANEOUS | Status: DC
  Filled 2016-10-30: qty 0.4

## 2016-10-30 MED ORDER — ACETAMINOPHEN 325 MG PO TABS: 650.0000 mg | ORAL_TABLET | ORAL | Status: DC | PRN

## 2016-10-30 MED ORDER — ROSUVASTATIN CALCIUM 10 MG PO TABS
5.0000 mg | ORAL_TABLET | Freq: Every day | ORAL | Status: DC
  Administered 2016-10-30: 5 mg via ORAL
  Filled 2016-10-30: qty 1

## 2016-10-30 MED ORDER — MONTELUKAST SODIUM 10 MG PO TABS: 10.0000 mg | ORAL_TABLET | Freq: Every day | ORAL | Status: DC

## 2016-10-30 MED ORDER — CLOPIDOGREL BISULFATE 75 MG PO TABS
75.0000 mg | ORAL_TABLET | Freq: Every day | ORAL | Status: DC
  Administered 2016-10-30: 75 mg via ORAL
  Filled 2016-10-30: qty 1

## 2016-10-30 MED ORDER — TIOTROPIUM BROMIDE MONOHYDRATE 18 MCG IN CAPS
18.0000 ug | ORAL_CAPSULE | Freq: Every day | RESPIRATORY_TRACT | Status: DC
  Administered 2016-10-30: 11:00:00 18 ug via RESPIRATORY_TRACT
  Filled 2016-10-30: qty 5

## 2016-10-30 MED ORDER — ACETAMINOPHEN 650 MG RE SUPP: 650.0000 mg | RECTAL | Status: DC | PRN

## 2016-10-30 MED ORDER — HYDRALAZINE HCL 20 MG/ML IJ SOLN: 10.0000 mg | Freq: Four times a day (QID) | INTRAMUSCULAR | Status: DC | PRN

## 2016-10-30 MED ORDER — ACETAMINOPHEN 160 MG/5ML PO SOLN
650.0000 mg | ORAL | Status: DC | PRN
  Filled 2016-10-30: qty 20.3

## 2016-10-30 MED ORDER — STROKE: EARLY STAGES OF RECOVERY BOOK
Freq: Once | Status: AC
  Administered 2016-10-30: 07:00:00

## 2016-10-30 NOTE — Progress Notes (Signed)
*  PRELIMINARY RESULTS* Echocardiogram 2D Echocardiogram has been performed.  Hannah Kirby 10/30/2016, 2:39 PM

## 2016-10-30 NOTE — ED Notes (Signed)
Altha Harm, RN  Notified of assigned bed

## 2016-10-30 NOTE — Consult Note (Signed)
Referring Physician: Bridgett Larsson    Chief Complaint: LUE weakness   HPI: Hannah Kirby is an 71 y.o. female who reports that she was coming back from the beach on yesterday and while in the car noted LUE weakness.  Reported no numbness.  Also reported no LLE weakness.  After returning home with no improvement in symptoms presented for evaluation.  Initial NIHSS of 0.    Date last known well: Date: 10/29/2016 Time last known well: Time: 13:30 tPA Given: No: Outside time window  Past Medical History:  Diagnosis Date  . Anemia   . Arthritis   . CAD (coronary artery disease)    a. cath 02/2006: BMS x 2 to RCA, cath o/w without significant coronary disease; b. nuclear stress test 07/2014: no signs of ischemia, no ekg changes concerning for ischemia, low risk study/normal study  . Chronic bronchitis ([place])    secondary to cigarette smoking  . COPD (chronic obstructive pulmonary disease) ([place])   . Diabetes mellitus   . FHx: allergies   . GERD (gastroesophageal reflux disease)   . Goiter   . 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 Hernia   . Hyperlipidemia   . Hypertension   . Kidney stone on left side 2013  . Microalbuminuria   . Obesity   . Spinal stenosis     Past Surgical History:  Procedure Laterality Date  . ABDOMINAL HYSTERECTOMY    . BREAST BIOPSY    . COLONOSCOPY WITH PROPOFOL N/A 09/23/2014   Procedure: COLONOSCOPY WITH PROPOFOL;  Surgeon: Lollie Sails, MD;  Location: [facility] ENDOSCOPY;  Service: Endoscopy;  Laterality: N/A;  . CORONARY ANGIOPLASTY WITH STENT PLACEMENT  2008  . ESOPHAGOGASTRODUODENOSCOPY (EGD) WITH PROPOFOL N/A 12/30/2014   Procedure: ESOPHAGOGASTRODUODENOSCOPY (EGD) WITH PROPOFOL;  Surgeon: Lollie Sails, MD;  Location: [facility] ENDOSCOPY;  Service: Endoscopy;  Laterality: N/A;  . ESOPHAGOGASTRODUODENOSCOPY (EGD) WITH PROPOFOL N/A 07/19/2016   Procedure: ESOPHAGOGASTRODUODENOSCOPY (EGD) WITH PROPOFOL;  Surgeon: Lollie Sails, MD;  Location: [facility] ENDOSCOPY;  Service:  Endoscopy;  Laterality: N/A;  . hysterectomy (other)      Family History  Problem Relation Age of Onset  . Heart failure Mother   . Stroke Mother   . Diabetes Mother   . Congestive Heart Failure Mother   . Lung cancer Father   . Breast cancer Maternal Aunt    Social History:  reports that she has been smoking Cigarettes.  She has a 40.00 pack-year smoking history. She has never used smokeless tobacco. She reports that she drinks alcohol. She reports that she does not use drugs.  Allergies:  Allergies  Allergen Reactions  . Sulfonamide Derivatives Other (See Comments)    Last taken as a child; made her mouth break out  . Statins Other (See Comments)    Caused elevated liver function studies  . Codeine Nausea And Vomiting  . Prednisone Palpitations and Other (See Comments)    Made her legs "feel weird."    Medications:  I have reviewed the patient's current medications. Prior to Admission:  Prescriptions Prior to Admission  Medication Sig Dispense Refill Last Dose  . albuterol (VENTOLIN HFA) 108 (90 Base) MCG/ACT inhaler Inhale 2 puffs into the lungs every 4 (four) hours as needed for wheezing or shortness of breath. 48 g 3 Taking  . amLODipine (NORVASC) 5 MG tablet Take 1 tablet (5 mg total) by mouth daily. 90 tablet 3 10/29/2016 at Unknown time  . aspirin (ASPIR-81) 81 MG EC tablet Take 81 mg by  mouth daily.     10/29/2016 at Unknown time  . chlorhexidine (PERIDEX) 0.12 % solution Use as directed 15 mLs in the mouth or throat 2 (two) times daily. (Patient taking differently: Use as directed 15 mLs in the mouth or throat as needed. ) 120 mL 6 prn  . clopidogrel (PLAVIX) 75 MG tablet Take 1 tablet (75 mg total) by mouth daily. 90 tablet 3 10/29/2016 at Unknown time  . ezetimibe (ZETIA) 10 MG tablet Take 1 tablet (10 mg total) by mouth daily. 90 tablet 3 10/29/2016 at Unknown time  . fluticasone furoate-vilanterol (BREO ELLIPTA) 100-25 MCG/INH AEPB Inhale 1 puff into the lungs daily. 84  each 3 10/29/2016 at Unknown time  . HYDROcodone-acetaminophen (NORCO/VICODIN) 5-325 MG tablet Take 1 tablet by mouth every 6 (six) hours as needed. 120 tablet 0 10/29/2016 at Unknown time  . insulin glargine (LANTUS) 100 UNIT/ML injection Inject 12 Units into the skin daily.    10/29/2016 at Unknown time  . Liraglutide (VICTOZA) 18 MG/3ML SOLN injection Inject 1.8 mg into the skin daily.    10/29/2016 at Unknown time  . lisinopril (PRINIVIL,ZESTRIL) 40 MG tablet Take 1 tablet (40 mg total) by mouth daily. 90 tablet 3 10/29/2016 at Unknown time  . meclizine (ANTIVERT) 25 MG tablet Take by mouth as needed.    Taking  . metFORMIN (GLUCOPHAGE) 1000 MG tablet Take 1,000 mg by mouth 2 (two) times daily with a meal.   10/29/2016 at Unknown time  . metoprolol tartrate (LOPRESSOR) 25 MG tablet Take 1 tablet (25 mg total) by mouth 2 (two) times daily. 180 tablet 3 10/29/2016 at Unknown time  . montelukast (SINGULAIR) 10 MG tablet Take 1 tablet (10 mg total) by mouth at bedtime. (Patient taking differently: Take 10 mg by mouth at bedtime. ) 30 tablet 12 prn  . Multiple Vitamins-Minerals (DAILY MULTI) TABS Take 1 tablet by mouth daily.    10/29/2016 at Unknown time  . nitroGLYCERIN (NITROSTAT) 0.4 MG SL tablet Place 1 tablet (0.4 mg total) under the tongue every 5 (five) minutes as needed. 25 tablet 6 prn  . Omega-3 Fatty Acids (FISH OIL) 1000 MG CAPS Take 2 capsules by mouth 2 (two) times daily. Take 4   10/29/2016 at Unknown time  . ondansetron (ZOFRAN ODT) 4 MG disintegrating tablet Take 1 tablet (4 mg total) by mouth every 8 (eight) hours as needed for nausea or vomiting. 20 tablet 0 prn  . RABEprazole (ACIPHEX) 20 MG tablet Take 20 mg by mouth daily.    10/29/2016 at Unknown time  . rosuvastatin (CRESTOR) 5 MG tablet Take 1 tablet (5 mg total) by mouth daily. 90 tablet 3 10/29/2016 at Unknown time  . sucralfate (CARAFATE) 1 g tablet Take 1 g by mouth 2 (two) times daily.    10/29/2016 at Unknown time  . tiotropium (SPIRIVA) 18  MCG inhalation capsule Place 1 capsule (18 mcg total) into inhaler and inhale daily. 90 capsule 4 10/29/2016 at Unknown time   Scheduled: . aspirin EC  81 mg Oral Daily  . clopidogrel  75 mg Oral Daily  . enoxaparin (LOVENOX) injection  40 mg Subcutaneous Q24H  . fluticasone furoate-vilanterol  1 puff Inhalation Daily  . insulin aspart  0-5 Units Subcutaneous QHS  . insulin aspart  0-9 Units Subcutaneous TID WC  . montelukast  10 mg Oral QHS  . pantoprazole  40 mg Oral Daily  . rosuvastatin  5 mg Oral Daily  . tiotropium  18 mcg Inhalation Daily  ROS: History obtained from the patient  General ROS: negative for - chills, fatigue, fever, night sweats, weight gain or weight loss Psychological ROS: negative for - behavioral disorder, hallucinations, memory difficulties, mood swings or suicidal ideation Ophthalmic ROS: negative for - blurry vision, double vision, eye pain or loss of vision ENT ROS: negative for - epistaxis, nasal discharge, oral lesions, sore throat, tinnitus or vertigo Allergy and Immunology ROS: negative for - hives or itchy/watery eyes Hematological and Lymphatic ROS: negative for - bleeding problems, bruising or swollen lymph nodes Endocrine ROS: negative for - galactorrhea, hair pattern changes, polydipsia/polyuria or temperature intolerance Respiratory ROS: negative for - cough, hemoptysis, shortness of breath or wheezing Cardiovascular ROS: negative for - chest pain, dyspnea on exertion, edema or irregular heartbeat Gastrointestinal ROS: negative for - abdominal pain, diarrhea, hematemesis, nausea/vomiting or stool incontinence Genito-Urinary ROS: negative for - dysuria, hematuria, incontinence or urinary frequency/urgency Musculoskeletal ROS: back pain Neurological ROS: as noted in HPI Dermatological ROS: rash   Physical Examination: Blood pressure (!) 153/77, pulse 84, temperature 98 F (36.7 C), temperature source Oral, resp. rate (!) 23, height 5\' 5"  (1.651  m), weight 72.6 kg (160 lb), SpO2 93 %.  HEENT-  Normocephalic, no lesions, without obvious abnormality.  Normal external eye and conjunctiva.  Normal TM's bilaterally.  Normal auditory canals and external ears. Normal external nose, mucus membranes and septum.  Normal pharynx. Cardiovascular- S1, S2 normal, pulses palpable throughout   Lungs- chest clear, no wheezing, rales, normal symmetric air entry Abdomen- soft, non-tender; bowel sounds normal; no masses,  no organomegaly Extremities- no edema Lymph-no adenopathy palpable Musculoskeletal-no joint tenderness, deformity or swelling Skin-annular rash on arms and legs  Neurological Examination   Mental Status: Alert, oriented, thought content appropriate.  Speech fluent without evidence of aphasia.  Able to follow 3 step commands without difficulty. Cranial Nerves: II: Discs flat bilaterally; Visual fields grossly normal, pupils equal, round, reactive to light and accommodation III,IV, VI: ptosis not present, extra-ocular motions intact bilaterally V,VII: smile symmetric, facial light touch sensation normal bilaterally VIII: hearing normal bilaterally IX,X: gag reflex present XI: bilateral shoulder shrug XII: midline tongue extension Motor: Right : Upper extremity   5/5    Left:     Upper extremity   4/5  Lower extremity   5/5     Lower extremity   5-/5 at hip flexor, o/w 5/5 Tone and bulk:normal tone throughout; no atrophy noted Sensory: Pinprick and light touch intact throughout, bilaterally Deep Tendon Reflexes: 2+ and symmetric throughout Plantars: Right: downgoing   Left: upgoing Cerebellar: Normal finger-to-nose and normal heel-to-shin testing bilaterally.  Decreased left rapid alternating movements Gait: not tested due to safety concerns   Laboratory Studies:  Basic Metabolic Panel:  Recent Labs Lab 10/29/16 2318 10/30/16 0515  NA 139  --   K 3.7  --   CL 104  --   CO2 25  --   GLUCOSE 107*  --   BUN 27*  --    CREATININE 1.17* 1.01*  CALCIUM 10.1  --     Liver Function Tests:  Recent Labs Lab 10/29/16 2318  AST 26  ALT 17  ALKPHOS 126  BILITOT 0.5  PROT 7.8  ALBUMIN 4.3   No results for input(s): LIPASE, AMYLASE in the last 168 hours. No results for input(s): AMMONIA in the last 168 hours.  CBC:  Recent Labs Lab 10/29/16 2318 10/30/16 0515  WBC 14.2* 13.7*  NEUTROABS 8.4*  --   HGB 12.8 12.0  HCT 38.2 36.4  MCV 72.7* 72.3*  PLT 249 223    Cardiac Enzymes:  Recent Labs Lab 10/29/16 2318  TROPONINI <0.03    BNP: Invalid input(s): POCBNP  CBG:  Recent Labs Lab 10/29/16 2325 10/30/16 0155 10/30/16 0940  GLUCAP 110* 109* 229*    Microbiology: Results for orders placed or performed during the hospital encounter of 07/19/16  KOH prep     Status: None   Collection Time: 07/19/16  7:57 AM  Result Value Ref Range Status   Specimen Description BRONCH BRUSH TIP  Final   Special Requests NONE  Final   KOH Prep BUDDING YEAST SEEN  Final   Report Status 07/19/2016 FINAL  Final    Coagulation Studies:  Recent Labs  10/29/16 2318  LABPROT 12.5  INR 0.94    Urinalysis: No results for input(s): COLORURINE, LABSPEC, PHURINE, GLUCOSEU, HGBUR, BILIRUBINUR, KETONESUR, PROTEINUR, UROBILINOGEN, NITRITE, LEUKOCYTESUR in the last 168 hours.  Invalid input(s): APPERANCEUR  Lipid Panel:    Component Value Date/Time   CHOL 129 10/30/2016 0515   CHOL 79 (L) 05/12/2016 0748   TRIG 118 10/30/2016 0515   HDL 52 10/30/2016 0515   HDL 28 (L) 05/12/2016 0748   CHOLHDL 2.5 10/30/2016 0515   VLDL 24 10/30/2016 0515   LDLCALC 53 10/30/2016 0515   LDLCALC 17 05/12/2016 0748    HgbA1C:  Lab Results  Component Value Date   HGBA1C 6.3 (H) 10/30/2016    Urine Drug Screen:  No results found for: LABOPIA, COCAINSCRNUR, LABBENZ, AMPHETMU, THCU, LABBARB  Alcohol Level: No results for input(s): ETH in the last 168 hours.  Other results: EKG: sinus rhythm at 86  bpm.  Imaging: Ct Head Wo Contrast  Result Date: 10/29/2016 CLINICAL DATA:  LEFT arm weakness beginning this afternoon. History of hypertension, hyperlipidemia, diabetes. EXAM: CT HEAD WITHOUT CONTRAST TECHNIQUE: Contiguous axial images were obtained from the base of the skull through the vertex without intravenous contrast. COMPARISON:  None. FINDINGS: BRAIN: No intraparenchymal hemorrhage, mass effect nor midline shift. The ventricles and sulci are normal for age. Patchy to confluent supratentorial white matter hypodensities including cystic changes LEFT corona radiata. No acute large vascular territory infarcts. No abnormal extra-axial fluid collections. Basal cisterns are patent. VASCULAR: Mile calcific atherosclerosis of the carotid siphons. Dolichoectatic intracranial vessels compatible with chronic hypertension. SKULL: No skull fracture. Moderate RIGHT temporomandibular osteoarthrosis. No significant scalp soft tissue swelling. SINUSES/ORBITS: The mastoid air-cells and included paranasal sinuses are well-aerated.The included ocular globes and orbital contents are non-suspicious. OTHER: None. IMPRESSION: 1. No acute intracranial process. 2. Moderate to severe chronic small vessel ischemic disease. 3. Critical Value/emergent results were called by telephone at the time of interpretation on 10/29/2016 at 11:23 pm to Dr. Beather Arbour, who verbally acknowledged these results. Electronically Signed   By: Elon Alas M.D.   On: 10/29/2016 23:23   US Carotid Bilateral (at [facility] Only)  Result Date: 10/30/2016 CLINICAL DATA:  71 year old female with left upper extremity weakness EXAM: BILATERAL CAROTID DUPLEX ULTRASOUND TECHNIQUE: Pearline Cables scale imaging, color Doppler and duplex ultrasound were performed of bilateral carotid and vertebral arteries in the neck. COMPARISON:  Head CT 10/29/2016 FINDINGS: Criteria: Quantification of carotid stenosis is based on velocity parameters that correlate the residual internal  carotid diameter with NASCET-based stenosis levels, using the diameter of the distal internal carotid lumen as the denominator for stenosis measurement. The following velocity measurements were obtained: RIGHT ICA:  80/21 cm/sec CCA:  16/10 cm/sec SYSTOLIC ICA/CCA RATIO:  0.9  DIASTOLIC ICA/CCA RATIO:  1.6 ECA:  109 cm/sec LEFT ICA:  97/29 cm/sec CCA:  56/81 cm/sec SYSTOLIC ICA/CCA RATIO:  1.3 DIASTOLIC ICA/CCA RATIO:  1.8 ECA:  84 cm/sec RIGHT CAROTID ARTERY: Mild focal heterogeneous plaque in the proximal internal carotid artery. By peak systolic velocity criteria, the estimated stenosis is less than 50%. RIGHT VERTEBRAL ARTERY:  Patent with normal antegrade flow. LEFT CAROTID ARTERY: Mild heterogeneous atherosclerotic plaque in the proximal internal carotid artery. By peak systolic velocity criteria the estimated stenosis remains less than 50%. LEFT VERTEBRAL ARTERY:  Patent with normal antegrade flow. IMPRESSION: 1. Mild (1-49%) stenosis proximal right internal carotid artery secondary to heterogenous atherosclerotic plaque. 2. Mild (1-49%) stenosis proximal left internal carotid artery secondary to heterogenous atherosclerotic plaque. 3. Vertebral arteries are patent with normal antegrade flow. Signed, Criselda Peaches, MD [facility] Radiology Electronically Signed   By: Jacqulynn Cadet M.D.   On: 10/30/2016 08:37    Assessment: 71 y.o. female presenting with acute onset left sided weakness.  Head CT reviewed and shows no acute changes.  Suspect lacunar infarct due to small vessel disease.  Carotid dopplers show no evidence of hemodynamically significant stenosis.  Echocardiogram pending.  A1c 6.3, LDL 53.  Patient on ASA., Plavix and a statin prior to admission.    Stroke Risk Factors - diabetes mellitus, hyperlipidemia, hypertension and smoking  Plan: 1. MRI, MRA  of the brain without contrast 2. PT consult, OT consult, Speech consult 3.  Echocardiogram pending 4. Prophylactic therapy-Continue ASA and Plavix 5. Telemetry monitoring 6. Frequent neuro checks 7. Smoking cessation counseling 8. Patient to follow up with neurology on an outpatient basis   Alexis Goodell, MD Neurology [phone] 10/30/2016, 12:38 PM

## 2016-10-30 NOTE — Plan of Care (Signed)
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  She is d/ced home on prophylactic therapy of ASA and plavix.  Will f/u outpt with Neurology.  Her NIHSS is 0 - no deficits although she states she has a heaviness in [place] - but hand grips are of equal strength to R hand.  She is A&O x4 and independent.  No issues with swallowing.  Strongly encouraged pt to quit smoking. She will d/c home with husband.

## 2016-10-30 NOTE — Progress Notes (Signed)
Request for Advance Directive: Patient has a copy of power of attorney for healthcare on file.

## 2016-10-30 NOTE — Discharge Summary (Signed)
[facility] NAME: Hannah Kirby    MR#:  [id number]  DATE OF BIRTH:  June 03, 1945  DATE OF ADMISSION:  10/29/2016   ADMITTING PHYSICIAN: Lance Coon, MD  DATE OF DISCHARGE: 10/30/2016 PRIMARY CARE PHYSICIAN: Birdie Sons, MD   ADMISSION DIAGNOSIS:  Essential hypertension [I10] Cerebrovascular accident (CVA), unspecified mechanism ([place]) [I63.9] DISCHARGE DIAGNOSIS:  Principal Problem:   Weakness of left upper extremity Active Problems:   Hyperlipidemia   CAD (coronary artery disease)   Acid reflux   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HCC)   Benign essential HTN   Chronic kidney disease, stage 3  SECONDARY DIAGNOSIS:   Past Medical History:  Diagnosis Date  . Anemia   . Arthritis   . CAD (coronary artery disease)    a. cath 02/2006: BMS x 2 to RCA, cath o/w without significant coronary disease; b. nuclear stress test 07/2014: no signs of ischemia, no ekg changes concerning for ischemia, low risk study/normal study  . Chronic bronchitis ([place])    secondary to cigarette smoking  . COPD (chronic obstructive pulmonary disease) ([place])   . Diabetes mellitus   . FHx: allergies   . GERD (gastroesophageal reflux disease)   . Goiter   . 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 Hernia   . Hyperlipidemia   . Hypertension   . Kidney stone on left side 2013  . Microalbuminuria   . Obesity   . Spinal stenosis    HOSPITAL COURSE:   Acute CVA with Weakness of left upper extremity MRI brain: Small acute right basal ganglia region infarct. MRA: negative. Carotid duplex showed bilateral 0-49% stenosis. Echocardiogram is pending. Continue aspirin, Plavix and Crestor per Dr. Doy Mince.    CAD (coronary artery disease) - stable, continue home meds   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HCC) - sliding scale insulin with corresponding glucose checks   Benign essential HTN - permissive hypertension tonight with pressure goal  less than 220/120   Hyperlipidemia - home dose anti-lipids   Acid reflux - home dose PPI   Chronic kidney disease, stage 3 - at baseline, monitor and avoid nephrotoxins  Tobacco abuse. Smoking cessation was counseled for 4 minutes. I discussed with Dr. Doy Mince. DISCHARGE CONDITIONS:  Stable, discharge to home today. CONSULTS OBTAINED:  Treatment Team:  Alexis Goodell, MD DRUG ALLERGIES:   Allergies  Allergen Reactions  . Sulfonamide Derivatives Other (See Comments)    Last taken as a child; made her mouth break out  . Statins Other (See Comments)    Caused elevated liver function studies  . Codeine Nausea And Vomiting  . Prednisone Palpitations and Other (See Comments)    Made her legs "feel weird."   DISCHARGE MEDICATIONS:   Allergies as of 10/30/2016      Reactions   Sulfonamide Derivatives Other (See Comments)   Last taken as a child; made her mouth break out   Statins Other (See Comments)   Caused elevated liver function studies   Codeine Nausea And Vomiting   Prednisone Palpitations, Other (See Comments)   Made her legs "feel weird."      Medication List    TAKE these medications   albuterol 108 (90 Base) MCG/ACT inhaler Commonly known as:  VENTOLIN HFA Inhale 2 puffs into the lungs every 4 (four) hours as needed for wheezing or shortness of breath.   amLODipine 5 MG tablet Commonly known as:  NORVASC Take 1 tablet (5 mg total) by mouth daily.  ASPIR-81 81 MG EC tablet Generic drug:  aspirin Take 81 mg by mouth daily.   chlorhexidine 0.12 % solution Commonly known as:  PERIDEX Use as directed 15 mLs in the mouth or throat 2 (two) times daily. What changed:  when to take this  reasons to take this   clopidogrel 75 MG tablet Commonly known as:  PLAVIX Take 1 tablet (75 mg total) by mouth daily.   DAILY MULTI Tabs Take 1 tablet by mouth daily.   ezetimibe 10 MG tablet Commonly known as:  ZETIA Take 1 tablet (10 mg total) by mouth daily.     Fish Oil 1000 MG Caps Take 2 capsules by mouth 2 (two) times daily. Take 4   fluticasone furoate-vilanterol 100-25 MCG/INH Aepb Commonly known as:  BREO ELLIPTA Inhale 1 puff into the lungs daily.   HYDROcodone-acetaminophen 5-325 MG tablet Commonly known as:  NORCO/VICODIN Take 1 tablet by mouth every 6 (six) hours as needed.   insulin glargine 100 UNIT/ML injection Commonly known as:  LANTUS Inject 12 Units into the skin daily.   lisinopril 40 MG tablet Commonly known as:  PRINIVIL,ZESTRIL Take 1 tablet (40 mg total) by mouth daily.   meclizine 25 MG tablet Commonly known as:  ANTIVERT Take by mouth as needed.   metFORMIN 1000 MG tablet Commonly known as:  GLUCOPHAGE Take 1,000 mg by mouth 2 (two) times daily with a meal.   metoprolol tartrate 25 MG tablet Commonly known as:  LOPRESSOR Take 1 tablet (25 mg total) by mouth 2 (two) times daily.   montelukast 10 MG tablet Commonly known as:  SINGULAIR Take 1 tablet (10 mg total) by mouth at bedtime.   nitroGLYCERIN 0.4 MG SL tablet Commonly known as:  NITROSTAT Place 1 tablet (0.4 mg total) under the tongue every 5 (five) minutes as needed.   ondansetron 4 MG disintegrating tablet Commonly known as:  ZOFRAN ODT Take 1 tablet (4 mg total) by mouth every 8 (eight) hours as needed for nausea or vomiting.   RABEprazole 20 MG tablet Commonly known as:  ACIPHEX Take 20 mg by mouth daily.   rosuvastatin 5 MG tablet Commonly known as:  CRESTOR Take 1 tablet (5 mg total) by mouth daily.   sucralfate 1 g tablet Commonly known as:  CARAFATE Take 1 g by mouth 2 (two) times daily.   tiotropium 18 MCG inhalation capsule Commonly known as:  SPIRIVA Place 1 capsule (18 mcg total) into inhaler and inhale daily.   VICTOZA 18 MG/3ML Soln injection Generic drug:  Liraglutide Inject 1.8 mg into the skin daily.            Discharge Care Instructions        Start     Ordered   10/30/16 0000  Increase activity slowly      10/30/16 1143   10/30/16 0000  Diet - low sodium heart healthy     10/30/16 1143       DISCHARGE INSTRUCTIONS:  See AVS.  If you experience worsening of your admission symptoms, develop shortness of breath, life threatening emergency, suicidal or homicidal thoughts you must seek medical attention immediately by calling 911 or calling your MD immediately  if symptoms less severe.  You Must read complete instructions/literature along with all the possible adverse reactions/side effects for all the Medicines you take and that have been prescribed to you. Take any new Medicines after you have completely understood and accpet all the possible adverse reactions/side effects.   Please  note  You were cared for by a hospitalist during your hospital stay. If you have any questions about your discharge medications or the care you received while you were in the hospital after you are discharged, you can call the unit and asked to speak with the hospitalist on call if the hospitalist that took care of you is not available. Once you are discharged, your primary care physician will handle any further medical issues. Please note that NO REFILLS for any discharge medications will be authorized once you are discharged, as it is imperative that you return to your primary care physician (or establish a relationship with a primary care physician if you do not have one) for your aftercare needs so that they can reassess your need for medications and monitor your lab values.    On the day of Discharge:  VITAL SIGNS:  Blood pressure (!) 153/77, pulse 84, temperature 98 F (36.7 C), temperature source Oral, resp. rate (!) 23, height 5\' 5"  (1.651 m), weight 160 lb (72.6 kg), SpO2 93 %. PHYSICAL EXAMINATION:  GENERAL:  71 y.o.-year-old patient lying in the bed with no acute distress.  EYES: Pupils equal, round, reactive to light and accommodation. No scleral icterus. Extraocular muscles intact.  HEENT: Head  atraumatic, normocephalic. Oropharynx and nasopharynx clear.  NECK:  Supple, no jugular venous distention. No thyroid enlargement, no tenderness.  LUNGS: Normal breath sounds bilaterally, no wheezing, rales,rhonchi or crepitation. No use of accessory muscles of respiration.  CARDIOVASCULAR: S1, S2 normal. No murmurs, rubs, or gallops.  ABDOMEN: Soft, non-tender, non-distended. Bowel sounds present. No organomegaly or mass.  EXTREMITIES: No pedal edema, cyanosis, or clubbing.  NEUROLOGIC: Cranial nerves II through XII are intact. Muscle strength 5/5 in all extremities. Sensation intact. Gait not checked.  PSYCHIATRIC: The patient is alert and oriented x 3.  SKIN: No obvious rash, lesion, or ulcer.  DATA REVIEW:   CBC  Recent Labs Lab 10/30/16 0515  WBC 13.7*  HGB 12.0  HCT 36.4  PLT 223    Chemistries   Recent Labs Lab 10/29/16 2318 10/30/16 0515  NA 139  --   K 3.7  --   CL 104  --   CO2 25  --   GLUCOSE 107*  --   BUN 27*  --   CREATININE 1.17* 1.01*  CALCIUM 10.1  --   AST 26  --   ALT 17  --   ALKPHOS 126  --   BILITOT 0.5  --      Microbiology Results  Results for orders placed or performed during the hospital encounter of 07/19/16  KOH prep     Status: None   Collection Time: 07/19/16  7:57 AM  Result Value Ref Range Status   Specimen Description BRONCH BRUSH TIP  Final   Special Requests NONE  Final   KOH Prep BUDDING YEAST SEEN  Final   Report Status 07/19/2016 FINAL  Final    RADIOLOGY:  Ct Head Wo Contrast  Result Date: 10/29/2016 CLINICAL DATA:  LEFT arm weakness beginning this afternoon. History of hypertension, hyperlipidemia, diabetes. EXAM: CT HEAD WITHOUT CONTRAST TECHNIQUE: Contiguous axial images were obtained from the base of the skull through the vertex without intravenous contrast. COMPARISON:  None. FINDINGS: BRAIN: No intraparenchymal hemorrhage, mass effect nor midline shift. The ventricles and sulci are normal for age. Patchy to  confluent supratentorial white matter hypodensities including cystic changes LEFT corona radiata. No acute large vascular territory infarcts. No abnormal extra-axial fluid collections. Basal  cisterns are patent. VASCULAR: Mile calcific atherosclerosis of the carotid siphons. Dolichoectatic intracranial vessels compatible with chronic hypertension. SKULL: No skull fracture. Moderate RIGHT temporomandibular osteoarthrosis. No significant scalp soft tissue swelling. SINUSES/ORBITS: The mastoid air-cells and included paranasal sinuses are well-aerated.The included ocular globes and orbital contents are non-suspicious. OTHER: None. IMPRESSION: 1. No acute intracranial process. 2. Moderate to severe chronic small vessel ischemic disease. 3. Critical Value/emergent results were called by telephone at the time of interpretation on 10/29/2016 at 11:23 pm to Dr. Beather Arbour, who verbally acknowledged these results. Electronically Signed   By: Elon Alas M.D.   On: 10/29/2016 23:23   Mr Brain Wo Contrast  Result Date: 10/30/2016 CLINICAL DATA:  Left upper extremity weakness. EXAM: MRI HEAD WITHOUT CONTRAST MRA HEAD WITHOUT CONTRAST TECHNIQUE: Multiplanar, multiecho pulse sequences of the brain and surrounding structures were obtained without intravenous contrast. Angiographic images of the head were obtained using MRA technique without contrast. COMPARISON:  Head CT 10/29/2016 FINDINGS: MRI HEAD FINDINGS Brain: There is a small acute right lateral lenticulostriate territory infarct involving the posterior lentiform nucleus, external capsule, and corona radiata. Chronic microhemorrhages are noted at the level of the insula bilaterally as well as in the posterior left temporal lobe. There are chronic lacunar infarcts involving the bilateral basal ganglia and right thalamus as well as left paracentral pons. Patchy subcortical and deep cerebral white matter T2 hyperintensities are compatible with moderate chronic small vessel  ischemic disease. There is mild generalized cerebral atrophy. No mass, midline shift, or extra-axial fluid collection is seen. Vascular: Major intracranial vascular flow voids are preserved. Skull and upper cervical spine: Unremarkable bone marrow signal. 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 Sinuses/Orbits: Unremarkable orbits. Paranasal sinuses and mastoid air cells are clear. Other: None. MRA HEAD FINDINGS The visualized distal vertebral arteries are widely patent to the basilar with the right being minimally larger than the left. Patent PICA and SCA origins are visualized bilaterally. The basilar artery is widely patent. Posterior communicating arteries are diminutive or absent. PCAs are patent without evidence of significant stenosis. The internal carotid arteries are patent from skullbase to carotid termini without evidence of significant stenosis. ACAs and MCAs are patent without evidence of proximal branch occlusion or significant stenosis. No aneurysm is identified. IMPRESSION: 1. 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 2. Moderate chronic small vessel ischemic disease with chronic lacunar infarcts as above. 3. Negative head MRA. Electronically Signed   By: Logan Bores M.D.   On: 10/30/2016 14:36   US Carotid Bilateral (at [facility] Only)  Result Date: 10/30/2016 CLINICAL DATA:  71 year old female with left upper extremity weakness EXAM: BILATERAL CAROTID DUPLEX ULTRASOUND TECHNIQUE: Pearline Cables scale imaging, color Doppler and duplex ultrasound were performed of bilateral carotid and vertebral arteries in the neck. COMPARISON:  Head CT 10/29/2016 FINDINGS: Criteria: Quantification of carotid stenosis is based on velocity parameters that correlate the residual internal carotid diameter with NASCET-based stenosis levels, using the diameter of the distal internal carotid lumen as the denominator for stenosis measurement. The following velocity measurements were obtained: RIGHT ICA:  80/21  cm/sec CCA:  34/19 cm/sec SYSTOLIC ICA/CCA RATIO:  0.9 DIASTOLIC ICA/CCA RATIO:  1.6 ECA:  109 cm/sec LEFT ICA:  97/29 cm/sec CCA:  37/90 cm/sec SYSTOLIC ICA/CCA RATIO:  1.3 DIASTOLIC ICA/CCA RATIO:  1.8 ECA:  84 cm/sec RIGHT CAROTID ARTERY: Mild focal heterogeneous plaque in the proximal internal carotid artery. By peak systolic velocity criteria, the estimated stenosis is less than 50%. RIGHT VERTEBRAL ARTERY:  Patent with normal antegrade flow. LEFT CAROTID ARTERY: Mild heterogeneous atherosclerotic plaque in the proximal internal carotid artery. By peak systolic velocity criteria the estimated stenosis remains less than 50%. LEFT VERTEBRAL ARTERY:  Patent with normal antegrade flow. IMPRESSION: 1. Mild (1-49%) stenosis proximal right internal carotid artery secondary to heterogenous atherosclerotic plaque. 2. Mild (1-49%) stenosis proximal left internal carotid artery secondary to heterogenous atherosclerotic plaque. 3. Vertebral arteries are patent with normal antegrade flow. Signed, Criselda Peaches, MD [facility] Electronically Signed   By: Jacqulynn Cadet M.D.   On: 10/30/2016 08:37   Mr Jodene Nam Head/brain AV Cm  Result Date: 10/30/2016 CLINICAL DATA:  Left upper extremity weakness. EXAM: MRI HEAD WITHOUT CONTRAST MRA HEAD WITHOUT CONTRAST TECHNIQUE: Multiplanar, multiecho pulse sequences of the brain and surrounding structures were obtained without intravenous contrast. Angiographic images of the head were obtained using MRA technique without contrast. COMPARISON:  Head CT 10/29/2016 FINDINGS: MRI HEAD FINDINGS Brain: There is a small acute right lateral lenticulostriate territory infarct involving the posterior lentiform nucleus, external capsule, and corona radiata. Chronic microhemorrhages are noted at the level of the insula bilaterally as well as in the posterior left temporal lobe. There are chronic lacunar infarcts involving the  bilateral basal ganglia and right thalamus as well as left paracentral pons. Patchy subcortical and deep cerebral white matter T2 hyperintensities are compatible with moderate chronic small vessel ischemic disease. There is mild generalized cerebral atrophy. No mass, midline shift, or extra-axial fluid collection is seen. Vascular: Major intracranial vascular flow voids are preserved. Skull and upper cervical spine: Unremarkable bone marrow signal. 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 Sinuses/Orbits: Unremarkable orbits. Paranasal sinuses and mastoid air cells are clear. Other: None. MRA HEAD FINDINGS The visualized distal vertebral arteries are widely patent to the basilar with the right being minimally larger than the left. Patent PICA and SCA origins are visualized bilaterally. The basilar artery is widely patent. Posterior communicating arteries are diminutive or absent. PCAs are patent without evidence of significant stenosis. The internal carotid arteries are patent from skullbase to carotid termini without evidence of significant stenosis. ACAs and MCAs are patent without evidence of proximal branch occlusion or significant stenosis. No aneurysm is identified. IMPRESSION: 1. 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 2. Moderate chronic small vessel ischemic disease with chronic lacunar infarcts as above. 3. Negative head MRA. Electronically Signed   By: Logan Bores M.D.   On: 10/30/2016 14:36     Management plans discussed with the patient, her husband and they are in agreement.  CODE STATUS: Full Code   TOTAL TIME TAKING CARE OF THIS PATIENT: 33 minutes.    Demetrios Loll M.D on 10/30/2016 at 3:44 PM  Between 7am to 6pm - Pager - [phone]  After 6pm go to [email or web address] - Proofreader  [facility]  Office  [phone]  CC: Primary care physician; Birdie Sons, MD   Note: This dictation was prepared with Dragon dictation along with  smaller phrase technology. Any transcriptional errors that result from this process are unintentional.

## 2016-10-30 NOTE — Discharge Instructions (Signed)
Heart healthy and ADA diet. °Smoking cessation. °

## 2016-10-30 NOTE — H&P (Signed)
[facility] [place] NAME: Hannah Kirby    MR#:  [id number]  DATE OF BIRTH:  1945/09/14  DATE OF ADMISSION:  10/29/2016  PRIMARY CARE PHYSICIAN: Birdie Sons, MD   REQUESTING/REFERRING PHYSICIAN: Beather Arbour, MD  CHIEF COMPLAINT:   Chief Complaint  Patient presents with  . Cerebrovascular Accident    HISTORY OF PRESENT ILLNESS:  Hannah Kirby  is a 71 y.o. female who presents with left upper extremity weakness. Patient states that this started about 1:30 in the afternoon, and she came in to the ED some 7 or 8 hours afterwards when the symptoms persisted. Initial workup here was largely within normal limits, but she complains of persistent left upper extremity weakness. Hospitalists were called for admission and further evaluation  PAST MEDICAL HISTORY:   Past Medical History:  Diagnosis Date  . Anemia   . Arthritis   . CAD (coronary artery disease)    a. cath 02/2006: BMS x 2 to RCA, cath o/w without significant coronary disease; b. nuclear stress test 07/2014: no signs of ischemia, no ekg changes concerning for ischemia, low risk study/normal study  . Chronic bronchitis ([place])    secondary to cigarette smoking  . COPD (chronic obstructive pulmonary disease) ([place])   . Diabetes mellitus   . FHx: allergies   . GERD (gastroesophageal reflux disease)   . Goiter   . Granulomatous disease ([place])   . Hernia   . Hyperlipidemia   . Hypertension   . Kidney stone on left side 2013  . Microalbuminuria   . Obesity   . Spinal stenosis     PAST SURGICAL HISTORY:   Past Surgical History:  Procedure Laterality Date  . ABDOMINAL HYSTERECTOMY    . BREAST BIOPSY    . COLONOSCOPY WITH PROPOFOL N/A 09/23/2014   Procedure: COLONOSCOPY WITH PROPOFOL;  Surgeon: Lollie Sails, MD;  Location[facility] END[facility]COPY;  Service: Endoscopy;  Laterality: N/A;  . CORONARY ANGIOPLASTY WITH STENT PLACEMENT  2008  . ESOPHAGOGASTRODUODENOSCOPY (EGD) WITH  PROPOFOL N/A 12/30/2014   Procedure: ESOPHAGOGASTRODUODENOSCOPY (EGD) WITH PROPOFOL;  Surgeon: Lollie Sails, MD;  Location: [facility] ENDOSCOPY;  Service: Endoscopy;  Laterality: N/A;  . ESOPHAGOGASTRODUODENOSCOPY (EGD) WITH PROPOFOL N/A 07/19/2016   Procedure: ESOPHAGOGASTRODUODENOSCOPY (EGD) WITH PROPOFOL;  Surgeon: Lollie Sails, MD;  Location: [facility] ENDOSCOPY;  Service: Endoscopy;  Laterality: N/A;  . hysterectomy (other)      SOCIAL HISTORY:   Social History  Substance Use Topics  . Smoking status: Current Every Day Smoker    Packs/day: 1.00    Years: 40.00    Types: Cigarettes  . Smokeless tobacco: Never Used  . Alcohol use Yes     Comment: 1/month- wine    FAMILY HISTORY:   Family History  Problem Relation Age of Onset  . Heart failure Mother   . Stroke Mother   . Diabetes Mother   . Congestive Heart Failure Mother   . Lung cancer Father   . Breast cancer Maternal Aunt     DRUG ALLERGIES:   Allergies  Allergen Reactions  . Sulfonamide Derivatives Other (See Comments)    Last taken as a child; made her mouth break out  . Statins Other (See Comments)    Caused elevated liver function studies  . Codeine Nausea And Vomiting  . Prednisone Palpitations and Other (See Comments)    Made her legs "feel weird."    MEDICATIONS AT HOME:   Prior to Admission medications  Medication Sig Start Date End Date Taking? Authorizing Provider  albuterol (VENTOLIN HFA) 108 (90 Base) MCG/ACT inhaler Inhale 2 puffs into the lungs every 4 (four) hours as needed for wheezing or shortness of breath. 11/24/15  Yes Birdie Sons, MD  amLODipine (NORVASC) 5 MG tablet Take 1 tablet (5 mg total) by mouth daily. 06/20/16  Yes Minna Merritts, MD  aspirin (ASPIR-81) 81 MG EC tablet Take 81 mg by mouth daily.     Yes [provider]  chlorhexidine (PERIDEX) 0.12 % solution Use as directed 15 mLs in the mouth or throat 2 (two) times daily. Patient taking differently: Use as  directed 15 mLs in the mouth or throat as needed.  08/18/10  Yes Minna Merritts, MD  clopidogrel (PLAVIX) 75 MG tablet Take 1 tablet (75 mg total) by mouth daily. 11/19/15  Yes Minna Merritts, MD  ezetimibe (ZETIA) 10 MG tablet Take 1 tablet (10 mg total) by mouth daily. 06/08/16  Yes Gollan, Kathlene November, MD  fluticasone furoate-vilanterol (BREO ELLIPTA) 100-25 MCG/INH AEPB Inhale 1 puff into the lungs daily. 06/08/16  Yes Birdie Sons, MD  HYDROcodone-acetaminophen (NORCO/VICODIN) 5-325 MG tablet Take 1 tablet by mouth every 6 (six) hours as needed. 10/07/16  Yes Birdie Sons, MD  insulin glargine (LANTUS) 100 UNIT/ML injection Inject 12 Units into the skin daily.    Yes [provider]  Liraglutide (VICTOZA) 18 MG/3ML SOLN injection Inject 1.8 mg into the skin daily.    Yes [provider]  lisinopril (PRINIVIL,ZESTRIL) 40 MG tablet Take 1 tablet (40 mg total) by mouth daily. 11/19/15  Yes Minna Merritts, MD  meclizine (ANTIVERT) 25 MG tablet Take by mouth as needed.  07/06/11  Yes [provider]  metFORMIN (GLUCOPHAGE) 1000 MG tablet Take 1,000 mg by mouth 2 (two) times daily with a meal.   Yes [provider]  metoprolol tartrate (LOPRESSOR) 25 MG tablet Take 1 tablet (25 mg total) by mouth 2 (two) times daily. 11/19/15  Yes Gollan, Kathlene November, MD  montelukast (SINGULAIR) 10 MG tablet Take 1 tablet (10 mg total) by mouth at bedtime. Patient taking differently: Take 10 mg by mouth at bedtime.  04/20/16 10/29/17 Yes Birdie Sons, MD  Multiple Vitamins-Minerals (DAILY MULTI) TABS Take 1 tablet by mouth daily.    Yes [provider]  nitroGLYCERIN (NITROSTAT) 0.4 MG SL tablet Place 1 tablet (0.4 mg total) under the tongue every 5 (five) minutes as needed. 05/06/15  Yes Gollan, Kathlene November, MD  Omega-3 Fatty Acids (FISH OIL) 1000 MG CAPS Take 2 capsules by mouth 2 (two) times daily. Take 4   Yes [provider]  ondansetron (ZOFRAN ODT) 4 MG  disintegrating tablet Take 1 tablet (4 mg total) by mouth every 8 (eight) hours as needed for nausea or vomiting. 08/06/16  Yes Alfred Levins, [place], MD  RABEprazole (ACIPHEX) 20 MG tablet Take 20 mg by mouth daily.  12/30/14 10/29/17 Yes [provider]  rosuvastatin (CRESTOR) 5 MG tablet Take 1 tablet (5 mg total) by mouth daily. 05/19/16  Yes Gollan, Kathlene November, MD  sucralfate (CARAFATE) 1 g tablet Take 1 g by mouth 2 (two) times daily.  12/30/14 10/29/17 Yes [provider]  tiotropium (SPIRIVA) 18 MCG inhalation capsule Place 1 capsule (18 mcg total) into inhaler and inhale daily. 10/05/16  Yes Birdie Sons, MD    REVIEW OF SYSTEMS:  Review of Systems  Constitutional: Negative for chills, fever, malaise/fatigue and weight  loss.  HENT: Negative for ear pain, hearing loss and tinnitus.   Eyes: Negative for blurred vision, double vision, pain and redness.  Respiratory: Negative for cough, hemoptysis and shortness of breath.   Cardiovascular: Negative for chest pain, palpitations, orthopnea and leg swelling.  Gastrointestinal: Negative for abdominal pain, constipation, diarrhea, nausea and vomiting.  Genitourinary: Negative for dysuria, frequency and hematuria.  Musculoskeletal: Negative for back pain, joint pain and neck pain.  Skin:       No acne, rash, or lesions  Neurological: Positive for focal weakness. Negative for dizziness, tremors and weakness.  Endo/Heme/Allergies: Negative for polydipsia. Does not bruise/bleed easily.  Psychiatric/Behavioral: Negative for depression. The patient is not nervous/anxious and does not have insomnia.      VITAL SIGNS:   Vitals:   10/29/16 2301 10/29/16 2331 10/29/16 2335 10/30/16 0030  BP: (!) 200/99  (!) 175/92 (!) 182/93  Pulse: 88  83 86  Resp: 20  (!) 23 19  Temp:  98.6 F (37 C)    SpO2: 95%  94% 92%  Weight: 72.6 kg (160 lb)     Height: 5\' 5"  (1.651 m)      Wt Readings from Last 3 Encounters:  10/29/16 72.6 kg (160 lb)   08/12/16 71.5 kg (157 lb 9.6 oz)  08/08/16 71.7 kg (158 lb)    PHYSICAL EXAMINATION:  Physical Exam  Vitals reviewed. Constitutional: She is oriented to person, place, and time. She appears well-developed and well-nourished. No distress.  HENT:  Head: Normocephalic and atraumatic.  Mouth/Throat: Oropharynx is clear and moist.  Eyes: Pupils are equal, round, and reactive to light. Conjunctivae and EOM are normal. No scleral icterus.  Neck: Normal range of motion. Neck supple. No JVD present. No thyromegaly present.  Cardiovascular: Normal rate, regular rhythm and intact distal pulses.  Exam reveals no gallop and no friction rub.   No murmur heard. Respiratory: Effort normal and breath sounds normal. No respiratory distress. She has no wheezes. She has no rales.  GI: Soft. Bowel sounds are normal. She exhibits no distension. There is no tenderness.  Musculoskeletal: Normal range of motion. She exhibits no edema.  No arthritis, no gout  Lymphadenopathy:    She has no cervical adenopathy.  Neurological: She is alert and oriented to person, place, and time. No cranial nerve deficit.  Neurologic: Cranial nerves II-XII intact, Sensation intact to light touch/pinprick, 5/5 strength in all extremities except for 4/5 left upper extremity strength, no dysarthria, no aphasia, no dysphagia, memory intact, no pronator drift  Skin: Skin is warm and dry. No rash noted. No erythema.  Psychiatric: She has a normal mood and affect. Her behavior is normal. Judgment and thought content normal.    LABORATORY PANEL:   CBC  Recent Labs Lab 10/29/16 2318  WBC 14.2*  HGB 12.8  HCT 38.2  PLT 249   ------------------------------------------------------------------------------------------------------------------  Chemistries   Recent Labs Lab 10/29/16 2318  NA 139  K 3.7  CL 104  CO2 25  GLUCOSE 107*  BUN 27*  CREATININE 1.17*  CALCIUM 10.1  AST 26  ALT 17  ALKPHOS 126  BILITOT 0.5    ------------------------------------------------------------------------------------------------------------------  Cardiac Enzymes  Recent Labs Lab 10/29/16 2318  TROPONINI <0.03   ------------------------------------------------------------------------------------------------------------------  RADIOLOGY:  Ct Head Wo Contrast  Result Date: 10/29/2016 CLINICAL DATA:  LEFT arm weakness beginning this afternoon. History of hypertension, hyperlipidemia, diabetes. EXAM: CT HEAD WITHOUT CONTRAST TECHNIQUE: Contiguous axial images were obtained from the base of the skull through the  vertex without intravenous contrast. COMPARISON:  None. FINDINGS: BRAIN: No intraparenchymal hemorrhage, mass effect nor midline shift. The ventricles and sulci are normal for age. Patchy to confluent supratentorial white matter hypodensities including cystic changes LEFT corona radiata. No acute large vascular territory infarcts. No abnormal extra-axial fluid collections. Basal cisterns are patent. VASCULAR: Mile calcific atherosclerosis of the carotid siphons. Dolichoectatic intracranial vessels compatible with chronic hypertension. SKULL: No skull fracture. Moderate RIGHT temporomandibular osteoarthrosis. No significant scalp soft tissue swelling. SINUSES/ORBITS: The mastoid air-cells and included paranasal sinuses are well-aerated.The included ocular globes and orbital contents are non-suspicious. OTHER: None. IMPRESSION: 1. No acute intracranial process. 2. Moderate to severe chronic small vessel ischemic disease. 3. Critical Value/emergent results were called by telephone at the time of interpretation on 10/29/2016 at 11:23 pm to Dr. Beather Arbour, who verbally acknowledged these results. Electronically Signed   By: Elon Alas M.D.   On: 10/29/2016 23:23    EKG:   Orders placed or performed during the hospital encounter of 10/29/16  . ED EKG  . ED EKG  . EKG 12-Lead  . EKG 12-Lead  . EKG 12-Lead  . EKG 12-Lead     IMPRESSION AND PLAN:  Principal Problem:   Weakness of left upper extremity - admit her stroke admission orders set with imaging and neurology consult Active Problems:   CAD (coronary artery disease) - stable, continue home meds   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HCC) - sliding scale insulin with corresponding glucose checks   Benign essential HTN - permissive hypertension tonight with pressure goal less than 220/120   Hyperlipidemia - home dose anti-lipids   Acid reflux - home dose PPI   Chronic kidney disease, stage 3 - at baseline, monitor and avoid nephrotoxins  All the records are reviewed and case discussed with ED provider. Management plans discussed with the patient and/or family.  DVT PROPHYLAXIS: SubQ lovenox  GI PROPHYLAXIS: PPI  ADMISSION STATUS: Observation  CODE STATUS: Full Code Status History    Date Active Date Inactive Code Status Order ID Comments User Context   07/23/2013  1:47 PM 07/24/2013  3:36 AM Full Code [id number]  [place] More[place], MD HOV      TOTAL TIME TAKING CARE OF THIS PATIENT: 40 minutes.   Jaleen Grupp [place] 10/30/2016, 12:37 AM  [facility]  Office  [phone]  CC: Primary care physician; Birdie Sons, MD  Note:  This document was prepared using Dragon voice recognition software and may include unintentional dictation errors.

## 2016-10-31 ENCOUNTER — Telehealth: Payer: Self-pay | Admitting: Cardiovascular Disease

## 2016-10-31 NOTE — Telephone Encounter (Signed)
Pt calling stating she is to come in next week to see Dr [name]  [facility] But she states she was in Hospital this weekend and they drew labs She would like to know does she need to still go get labs drawn   Please advise

## 2016-10-31 NOTE — Telephone Encounter (Signed)
Patient had Lipid panel and CMET drawn during hospital visit last week. Patient was due for Lipid and Liver panel. Advised patient she did not need to get repeat labs before upcoming appointment and she verbalized understanding.

## 2016-11-06 NOTE — Progress Notes (Signed)
Cardiology Office Note  Date:  11/08/2016   ID:  Hannah Kirby, DOB 11-Mar-1945, MRN [id number]  PCP:  Birdie Sons, MD   Chief Complaint  Patient presents with  . other    70mo f/u. Pt states she had stroke a week ago. ECHO 9-9. Pt denies cp/sob.  Reviewed meds with pt verbally.    HPI:  [name] is a 71 year-old woman with a history of  diabetes,  obesity,  significantly improved with  weight loss on Victoza,  coronary artery disease with stenting of her RCA in January 2008,  long smoking history who continues to smoke,  hyperlipidemia  outbreak of her granulomatous disease in summary 2013, prednisone for this condition.  History pinched nerve and is on pain medication. who presents for routine followup of her coronary artery disease.  Recently in the hospital with stroke 10/30/2016 Left upper extremity weakness on coming back from the beach while in the car She was on aspirin and Plavix at the time Echocardiogram normal ejection fractionno significant valve disease, moderate LVH, diastolic dysfunction Hospital records reviewed with the patient in detail MRI brain: Small acute right basal ganglia region infarct. Prior strokes MRA: negative. Carotid duplex showed bilateral 0-49% stenosis.  She continues to smoke, trying to cut back Scheduled to see neurology in several weeks' time Still with residual left hand weakness  Lab work reviewed with her in detail, cholesterol at goal Total cholesterol 140  EKG personally reviewed by myself on todays visit Normal sinus rhythm with rate 74 bpm T-wave abnormality 1 and aVL  Other recent hospitalizations ER 06/17/16 with HTN Hospital records reviewed with the patient in detail Started amlodipine 2.5 daily Given ativan Per the patient  Back to the ER several days later for high blood pressure  Amlodipine increased up to 5 mg daily  she has continued to have high pressures   anxiety symptoms recently, unclear  why She reports panic attacks in January, claustrophobic in the house   No recent upper respiratory symptoms Poor diet recently, eating pastries   She is currently on albvuterol and spirivia  On victoza,   does not drink much fluids Continues to smoke one pack per day Denies having any significant chest pain on exertion  History of GERD and Barrett's esophagus per the patient. Takes omeprazole Chronic back pain  stress test for chest pain 07/29/2014 that showed no ischemia chronicproblems with her teeth  episode of Dehydration after diarrhea mid September 2017 Starting having rectal bleeding Stopped her Plavix for several days with improvement of her symptoms   working with Dr. Eddie Dibbles on her diabetes.    PMH:   has a past medical history of Anemia; Arthritis; CAD (coronary artery disease); Chronic bronchitis ([place]); COPD (chronic obstructive pulmonary disease) ([place]); Diabetes mellitus; FHx: allergies; GERD (gastroesophageal reflux disease); Goiter; Granulomatous disease ([place]); Hernia; Hyperlipidemia; Hypertension; Kidney stone on left side (2013); Microalbuminuria; Obesity; Spinal stenosis; and Stroke ([place]) (10/29/2016).  PSH:    Past Surgical History:  Procedure Laterality Date  . ABDOMINAL HYSTERECTOMY    . BREAST BIOPSY    . COLONOSCOPY WITH PROPOFOL N/A 09/23/2014   Procedure: COLONOSCOPY WITH PROPOFOL;  Surgeon: Lollie Sails, MD;  Location: [facility] ENDOSCOPY;  Service: Endoscopy;  Laterality: N/A;  . CORONARY ANGIOPLASTY WITH STENT PLACEMENT  2008  . ESOPHAGOGASTRODUODENOSCOPY (EGD) WITH PROPOFOL N/A 12/30/2014   Procedure: ESOPHAGOGASTRODUODENOSCOPY (EGD) WITH PROPOFOL;  Surgeon: Lollie Sails, MD;  Location: [facility] ENDOSCOPY;  Service: Endoscopy;  Laterality: N/A;  .  ESOPHAGOGASTRODUODENOSCOPY (EGD) WITH PROPOFOL N/A 07/19/2016   Procedure: ESOPHAGOGASTRODUODENOSCOPY (EGD) WITH PROPOFOL;  Surgeon: Lollie Sails, MD;  Location: [facility] ENDOSCOPY;  Service:  Endoscopy;  Laterality: N/A;  . hysterectomy (other)      Current Outpatient Prescriptions  Medication Sig Dispense Refill  . albuterol (VENTOLIN HFA) 108 (90 Base) MCG/ACT inhaler Inhale 2 puffs into the lungs every 4 (four) hours as needed for wheezing or shortness of breath. 48 g 3  . amLODipine (NORVASC) 5 MG tablet Take 1 tablet (5 mg total) by mouth daily. 90 tablet 3  . aspirin (ASPIR-81) 81 MG EC tablet Take 81 mg by mouth daily.      . chlorhexidine (PERIDEX) 0.12 % solution Use as directed 15 mLs in the mouth or throat 2 (two) times daily. (Patient taking differently: Use as directed 15 mLs in the mouth or throat as needed. ) 120 mL 6  . clopidogrel (PLAVIX) 75 MG tablet Take 1 tablet (75 mg total) by mouth daily. 90 tablet 3  . ezetimibe (ZETIA) 10 MG tablet Take 1 tablet (10 mg total) by mouth daily. 90 tablet 3  . fluticasone furoate-vilanterol (BREO ELLIPTA) 100-25 MCG/INH AEPB Inhale 1 puff into the lungs daily. 84 each 3  . HYDROcodone-acetaminophen (NORCO/VICODIN) 5-325 MG tablet Take 1 tablet by mouth every 6 (six) hours as needed. 120 tablet 0  . insulin glargine (LANTUS) 100 UNIT/ML injection Inject 12 Units into the skin daily.     . Liraglutide (VICTOZA) 18 MG/3ML SOLN injection Inject 1.8 mg into the skin daily.     Marland Kitchen lisinopril (PRINIVIL,ZESTRIL) 40 MG tablet Take 1 tablet (40 mg total) by mouth daily. 90 tablet 3  . meclizine (ANTIVERT) 25 MG tablet Take by mouth as needed.     . metFORMIN (GLUCOPHAGE) 1000 MG tablet Take 1,000 mg by mouth 2 (two) times daily with a meal.    . metoprolol tartrate (LOPRESSOR) 25 MG tablet Take 1 tablet (25 mg total) by mouth 2 (two) times daily. 180 tablet 3  . montelukast (SINGULAIR) 10 MG tablet Take 1 tablet (10 mg total) by mouth at bedtime. (Patient taking differently: Take 10 mg by mouth at bedtime. ) 30 tablet 12  . Multiple Vitamins-Minerals (DAILY MULTI) TABS Take 1 tablet by mouth daily.     . nitroGLYCERIN (NITROSTAT) 0.4 MG  SL tablet Place 1 tablet (0.4 mg total) under the tongue every 5 (five) minutes as needed. 25 tablet 6  . Omega-3 Fatty Acids (FISH OIL) 1000 MG CAPS Take 2 capsules by mouth 2 (two) times daily. Take 4    . ondansetron (ZOFRAN ODT) 4 MG disintegrating tablet Take 1 tablet (4 mg total) by mouth every 8 (eight) hours as needed for nausea or vomiting. 20 tablet 0  . RABEprazole (ACIPHEX) 20 MG tablet Take 20 mg by mouth daily.     . rosuvastatin (CRESTOR) 5 MG tablet Take 1 tablet (5 mg total) by mouth daily. 90 tablet 3  . sucralfate (CARAFATE) 1 g tablet Take 1 g by mouth 2 (two) times daily.     Marland Kitchen tiotropium (SPIRIVA) 18 MCG inhalation capsule Place 1 capsule (18 mcg total) into inhaler and inhale daily. 90 capsule 4   No current facility-administered medications for this visit.      Allergies:   Sulfonamide derivatives; Statins; Codeine; and Prednisone   Social History:  The patient  reports that she has been smoking Cigarettes.  She has a 20.00 pack-year smoking history. She has never used  smokeless tobacco. She reports that she drinks alcohol. She reports that she does not use drugs.   Family History:   family history includes Breast cancer in her maternal aunt; Congestive Heart Failure in her mother; Diabetes in her mother; Heart failure in her mother; Lung cancer in her father; Stroke in her mother.    Review of Systems: Review of Systems  Constitutional: Negative.   Respiratory: Negative.   Cardiovascular: Negative.   Gastrointestinal: Negative.   Musculoskeletal: Positive for back pain.  Neurological: Negative.        Left hand weakness  Psychiatric/Behavioral: Negative.   All other systems reviewed and are negative.    PHYSICAL EXAM: VS:  BP (!) 150/92 (BP Location: Right Arm, Patient Position: Sitting, Cuff Size: Normal)   Pulse 74   Ht 5\' 5"  (1.651 m)   Wt 155 lb (70.3 kg)   BMI 25.79 kg/m  , BMI Body mass index is 25.79 kg/m. GEN: Well nourished, well developed,  in no acute distress  HEENT: normal  Neck: no JVD, carotid bruits, or masses Cardiac: RRR; no murmurs, rubs, or gallops,no edema  Respiratory:  clear to auscultation bilaterally, normal work of breathing GI: soft, nontender, nondistended, + BS MS: no deformity or atrophy  Skin: warm and dry, no rash Neuro:  Strength and sensation are intact Psych: euthymic mood, full affect    Recent Labs: 10/29/2016: ALT 17; BUN 27; Potassium 3.7; Sodium 139 10/30/2016: Creatinine, Ser 1.01; Hemoglobin 12.0; Platelets 223    Lipid Panel Lab Results  Component Value Date   CHOL 129 10/30/2016   HDL 52 10/30/2016   LDLCALC 53 10/30/2016   TRIG 118 10/30/2016      Wt Readings from Last 3 Encounters:  11/08/16 155 lb (70.3 kg)  10/29/16 160 lb (72.6 kg)  08/12/16 157 lb 9.6 oz (71.5 kg)       ASSESSMENT AND PLAN:  Anxiety  prior history of panic attacks Previously having problems insomnia Managed by primary care  Hyperlipidemia, unspecified hyperlipidemia type - Plan: EKG 12-Lead, Hepatic function panel, Cholesterol is at goal on the current lipid regimen. No changes to the medications were made.  Coronary artery disease involving native coronary artery of native heart without angina pectoris -  Currently with no symptoms of angina. No further workup at this time. Continue current medication regimen.  CVA Small vessel disease, continues to smoke, history of diabetes, hyperlipidemia  scheduled to see neurology in the next several weeks   Benign essential HTN - Plan: EKG 12-Lead, Hepatic function panel, Lipid Profile.bpok Blood pressure is well controlled on today's visit. No changes made to the medications.  Chest pain at rest - Plan: EKG 12-Lead, Hepatic function panel, Lipid Profile No significant chest pain on today's visit. Stable, no further workup  We have recommended regular exercise program  Smoker We have encouraged her to continue to work on weaning her cigarettes and  smoking cessation.  she will look into the cost of chantix. Discussed with her again the need to stop smoking given recent stroke   Chronic renal insufficiency  Likely secondary to Long-standing diabetes   Disposition:   F/U  6 months   Total encounter time more than 45 minutes  Greater than 50% was spent in counseling and coordination of care with the patient    Orders Placed This Encounter  Procedures  . EKG 12-Lead     Signed, Esmond Plants, M.D., Ph.D. 11/08/2016  [place], [place]

## 2016-11-08 ENCOUNTER — Encounter: Payer: Self-pay | Admitting: Family Medicine

## 2016-11-08 ENCOUNTER — Ambulatory Visit (INDEPENDENT_AMBULATORY_CARE_PROVIDER_SITE_OTHER): Payer: Medicare Other | Admitting: Cardiovascular Disease

## 2016-11-08 ENCOUNTER — Ambulatory Visit (INDEPENDENT_AMBULATORY_CARE_PROVIDER_SITE_OTHER): Payer: Medicare Other | Admitting: Family Medicine

## 2016-11-08 ENCOUNTER — Encounter: Payer: Self-pay | Admitting: Cardiovascular Disease

## 2016-11-08 VITALS — BP 130/72 | HR 82 | Temp 97.9°F | Resp 16 | Wt 157.0 lb

## 2016-11-08 VITALS — BP 150/92 | HR 74 | Ht 65.0 in | Wt 155.0 lb

## 2016-11-08 DIAGNOSIS — I714 Abdominal aortic aneurysm, without rupture, unspecified: Secondary | ICD-10-CM

## 2016-11-08 DIAGNOSIS — M48061 Spinal stenosis, lumbar region without neurogenic claudication: Secondary | ICD-10-CM | POA: Diagnosis not present

## 2016-11-08 DIAGNOSIS — M6281 Muscle weakness (generalized): Secondary | ICD-10-CM | POA: Diagnosis not present

## 2016-11-08 DIAGNOSIS — Z23 Encounter for immunization: Secondary | ICD-10-CM | POA: Diagnosis not present

## 2016-11-08 DIAGNOSIS — I10 Essential (primary) hypertension: Secondary | ICD-10-CM

## 2016-11-08 DIAGNOSIS — I2510 Atherosclerotic heart disease of native coronary artery without angina pectoris: Secondary | ICD-10-CM | POA: Diagnosis not present

## 2016-11-08 DIAGNOSIS — F17200 Nicotine dependence, unspecified, uncomplicated: Secondary | ICD-10-CM

## 2016-11-08 DIAGNOSIS — I639 Cerebral infarction, unspecified: Secondary | ICD-10-CM | POA: Insufficient documentation

## 2016-11-08 DIAGNOSIS — J449 Chronic obstructive pulmonary disease, unspecified: Secondary | ICD-10-CM

## 2016-11-08 DIAGNOSIS — I679 Cerebrovascular disease, unspecified: Secondary | ICD-10-CM | POA: Diagnosis not present

## 2016-11-08 DIAGNOSIS — Z8673 Personal history of transient ischemic attack (TIA), and cerebral infarction without residual deficits: Secondary | ICD-10-CM | POA: Insufficient documentation

## 2016-11-08 DIAGNOSIS — I25118 Atherosclerotic heart disease of native coronary artery with other forms of angina pectoris: Secondary | ICD-10-CM

## 2016-11-08 DIAGNOSIS — R600 Localized edema: Secondary | ICD-10-CM

## 2016-11-08 DIAGNOSIS — I209 Angina pectoris, unspecified: Secondary | ICD-10-CM | POA: Diagnosis not present

## 2016-11-08 DIAGNOSIS — E0821 Diabetes mellitus due to underlying condition with diabetic nephropathy: Secondary | ICD-10-CM

## 2016-11-08 MED ORDER — EZETIMIBE 10 MG PO TABS: 10.0000 mg | ORAL_TABLET | Freq: Every day | ORAL | 3 refills | Status: DC

## 2016-11-08 MED ORDER — ROSUVASTATIN CALCIUM 5 MG PO TABS: 5.0000 mg | ORAL_TABLET | Freq: Every day | ORAL | 3 refills | Status: DC

## 2016-11-08 MED ORDER — METOPROLOL TARTRATE 25 MG PO TABS: 25.0000 mg | ORAL_TABLET | Freq: Two times a day (BID) | ORAL | 3 refills | Status: DC

## 2016-11-08 MED ORDER — CLOPIDOGREL BISULFATE 75 MG PO TABS: 75.0000 mg | ORAL_TABLET | Freq: Every day | ORAL | 3 refills | Status: DC

## 2016-11-08 MED ORDER — HYDROCODONE-ACETAMINOPHEN 5-325 MG PO TABS: 1.0000 | ORAL_TABLET | Freq: Four times a day (QID) | ORAL | 0 refills | Status: DC | PRN

## 2016-11-08 MED ORDER — CHLORHEXIDINE GLUCONATE 0.12 % MT SOLN: 15.0000 mL | Freq: Two times a day (BID) | OROMUCOSAL | 6 refills | Status: DC

## 2016-11-08 MED ORDER — AMLODIPINE BESYLATE 5 MG PO TABS: 5.0000 mg | ORAL_TABLET | Freq: Every day | ORAL | 3 refills | Status: DC

## 2016-11-08 MED ORDER — LISINOPRIL 40 MG PO TABS: 40.0000 mg | ORAL_TABLET | Freq: Every day | ORAL | 3 refills | Status: DC

## 2016-11-08 MED ORDER — FLUTICASONE FUROATE-VILANTEROL 100-25 MCG/INH IN AEPB: 1.0000 | INHALATION_SPRAY | Freq: Every day | RESPIRATORY_TRACT | 3 refills | Status: DC

## 2016-11-08 NOTE — Patient Instructions (Signed)
Medication Instructions:   No medication changes made  Call if you would like chantix  Labwork:  No new labs needed  Testing/Procedures:  No further testing at this time   Follow-Up: It was a pleasure seeing you in the office today. Please call us if you have new issues that need to be addressed before your next appt.  [phone]  Your physician wants you to follow-up in: 6 months.  You will receive a reminder letter in the mail two months in advance. If you don't receive a letter, please call our office to schedule the follow-up appointment.  If you need a refill on your cardiac medications before your next appointment, please call your pharmacy.

## 2016-11-08 NOTE — Progress Notes (Signed)
Patient: Hannah Kirby Female    DOB: Nov 04, 1945   71 y.o.   MRN: [id number] Visit Date: 11/08/2016  Today's Provider: Lelon Huh, MD   Chief Complaint  Patient presents with  . Hospitalization Follow-up   Subjective:    HPI  Follow up Hospitalization  Patient was admitted to [facility] on 10/29/2016 and discharged on 10/30/2016. She was treated for Hypertension and CVA. Treatment for this included counseling patient to quit smoking. Patient was also advised to continue Aspirin, Plavix and Crestor. She reports good compliance with treatment. She reports this condition is Unchanged. Patient was seen by Cardiologist Dr. [name] earlier today. Patient does have some weakness in her left leg. Her initial symptoms was weakness of her left arm which states persists without improvement since discharge. She has trouble with her grip and dropping objects. Has been squeezing soft ball, but no other therapy. No numbness or tingling.   ------------------------------------------------------------------------------------       Allergies  Allergen Reactions  . Sulfonamide Derivatives Other (See Comments)    Last taken as a child; made her mouth break out  . Statins Other (See Comments)    Caused elevated liver function studies  . Codeine Nausea And Vomiting  . Prednisone Palpitations and Other (See Comments)    Made her legs "feel weird."     Current Outpatient Prescriptions:  .  albuterol (VENTOLIN HFA) 108 (90 Base) MCG/ACT inhaler, Inhale 2 puffs into the lungs every 4 (four) hours as needed for wheezing or shortness of breath., Disp: 48 g, Rfl: 3 .  amLODipine (NORVASC) 5 MG tablet, Take 1 tablet (5 mg total) by mouth daily., Disp: 90 tablet, Rfl: 3 .  aspirin (ASPIR-81) 81 MG EC tablet, Take 81 mg by mouth daily.  , Disp: , Rfl:  .  chlorhexidine (PERIDEX) 0.12 % solution, Use as directed 15 mLs in the mouth or throat 2 (two) times daily. (Patient taking differently: Use as  directed 15 mLs in the mouth or throat as needed. ), Disp: 120 mL, Rfl: 6 .  clopidogrel (PLAVIX) 75 MG tablet, Take 1 tablet (75 mg total) by mouth daily., Disp: 90 tablet, Rfl: 3 .  ezetimibe (ZETIA) 10 MG tablet, Take 1 tablet (10 mg total) by mouth daily., Disp: 90 tablet, Rfl: 3 .  fluticasone furoate-vilanterol (BREO ELLIPTA) 100-25 MCG/INH AEPB, Inhale 1 puff into the lungs daily., Disp: 84 each, Rfl: 3 .  HYDROcodone-acetaminophen (NORCO/VICODIN) 5-325 MG tablet, Take 1 tablet by mouth every 6 (six) hours as needed., Disp: 120 tablet, Rfl: 0 .  insulin glargine (LANTUS) 100 UNIT/ML injection, Inject 12 Units into the skin daily. , Disp: , Rfl:  .  Liraglutide (VICTOZA) 18 MG/3ML SOLN injection, Inject 1.8 mg into the skin daily. , Disp: , Rfl:  .  lisinopril (PRINIVIL,ZESTRIL) 40 MG tablet, Take 1 tablet (40 mg total) by mouth daily., Disp: 90 tablet, Rfl: 3 .  meclizine (ANTIVERT) 25 MG tablet, Take by mouth as needed. , Disp: , Rfl:  .  metFORMIN (GLUCOPHAGE) 1000 MG tablet, Take 1,000 mg by mouth 2 (two) times daily with a meal., Disp: , Rfl:  .  metoprolol tartrate (LOPRESSOR) 25 MG tablet, Take 1 tablet (25 mg total) by mouth 2 (two) times daily., Disp: 180 tablet, Rfl: 3 .  montelukast (SINGULAIR) 10 MG tablet, Take 1 tablet (10 mg total) by mouth at bedtime. (Patient taking differently: Take 10 mg by mouth at bedtime. ), Disp: 30 tablet, Rfl: 12 .  Multiple Vitamins-Minerals (DAILY MULTI) TABS, Take 1 tablet by mouth daily. , Disp: , Rfl:  .  nitroGLYCERIN (NITROSTAT) 0.4 MG SL tablet, Place 1 tablet (0.4 mg total) under the tongue every 5 (five) minutes as needed., Disp: 25 tablet, Rfl: 6 .  Omega-3 Fatty Acids (FISH OIL) 1000 MG CAPS, Take 2 capsules by mouth 2 (two) times daily. Take 4, Disp: , Rfl:  .  ondansetron (ZOFRAN ODT) 4 MG disintegrating tablet, Take 1 tablet (4 mg total) by mouth every 8 (eight) hours as needed for nausea or vomiting., Disp: 20 tablet, Rfl: 0 .   RABEprazole (ACIPHEX) 20 MG tablet, Take 20 mg by mouth daily. , Disp: , Rfl:  .  rosuvastatin (CRESTOR) 5 MG tablet, Take 1 tablet (5 mg total) by mouth daily., Disp: 90 tablet, Rfl: 3 .  sucralfate (CARAFATE) 1 g tablet, Take 1 g by mouth 2 (two) times daily. , Disp: , Rfl:  .  tiotropium (SPIRIVA) 18 MCG inhalation capsule, Place 1 capsule (18 mcg total) into inhaler and inhale daily., Disp: 90 capsule, Rfl: 4  Review of Systems  Constitutional: Negative for appetite change, chills, fatigue and fever.  Respiratory: Negative for chest tightness and shortness of breath.   Cardiovascular: Negative for chest pain and palpitations.  Gastrointestinal: Negative for abdominal pain, nausea and vomiting.  Neurological: Positive for weakness (in left leg). Negative for dizziness.    Social History  Substance Use Topics  . Smoking status: Current Every Day Smoker    Packs/day: 0.50    Years: 40.00    Types: Cigarettes  . Smokeless tobacco: Never Used  . Alcohol use Yes     Comment: 1/month- wine   Objective:   BP 130/72 (BP Location: Right Arm, Cuff Size: Normal)   Pulse 82   Temp 97.9 F (36.6 C) (Oral)   Resp 16   Wt 157 lb (71.2 kg)   SpO2 95% Comment: room air  BMI 26.13 kg/m  There were no vitals filed for this visit.   Physical Exam   General Appearance:    Alert, cooperative, no distress  Eyes:    PERRL, conjunctiva/corneas clear, EOM's intact       Lungs:     Clear to auscultation bilaterally, respirations unlabored  Heart:    Regular rate and rhythm  Neurologic:   Awake, alert, oriented x 3. Muscles strength left grip, shoulder and elbow +4. Right arm +5. Both LE +5 muscle strength but very slightly weaker on left. No fascial droop or abnormal speech patterns.           Assessment & Plan:     1. Cerebrovascular disease BP, lipids, and dm well controlled  Strongly encourage complete cessation of tobacco products. ON ASA and clopidogrel Which she will continue until  seen by Dr. Melrose Nakayama 11-21-16. 2. - Ambulatory referral to Occupational Therapy  2. Benign essential HTN Well controlled.  Continue current medications.    3. Smoking Stop smoking   4. Muscle weakness of left upper extremity Secondary to CVA - Ambulatory referral to Occupational Therapy  5. Chronic obstructive pulmonary disease, unspecified COPD type ([place]) Well controlled.  Continue current medications.   - fluticasone furoate-vilanterol (BREO ELLIPTA) 100-25 MCG/INH AEPB; Inhale 1 puff into the lungs daily.  Dispense: 84 each; Refill: 3  6. Spinal stenosis of lumbar region, unspecified whether neurogenic claudication present Needs refill for: - HYDROcodone-acetaminophen (NORCO/VICODIN) 5-325 MG tablet; Take 1 tablet by mouth every 6 (six) hours as needed.  Dispense:  120 tablet; Refill: 0  7. Need for influenza vaccination  - Flu vaccine HIGH DOSE PF (Fluzone High dose)  Return in about 4 months (around 03/10/2017) for hypertension.       Lelon Huh, MD  [facility]

## 2016-11-21 DIAGNOSIS — R51 Headache: Secondary | ICD-10-CM | POA: Diagnosis not present

## 2016-11-21 DIAGNOSIS — I639 Cerebral infarction, unspecified: Secondary | ICD-10-CM | POA: Diagnosis not present

## 2016-11-24 ENCOUNTER — Ambulatory Visit: Payer: Medicare Other | Attending: Family Medicine | Admitting: Occupational Therapy

## 2016-11-24 ENCOUNTER — Encounter: Payer: Self-pay | Admitting: Occupational Therapy

## 2016-11-24 DIAGNOSIS — M6281 Muscle weakness (generalized): Secondary | ICD-10-CM | POA: Diagnosis not present

## 2016-11-24 DIAGNOSIS — R278 Other lack of coordination: Secondary | ICD-10-CM | POA: Diagnosis not present

## 2016-11-24 DIAGNOSIS — I69354 Hemiplegia and hemiparesis following cerebral infarction affecting left non-dominant side: Secondary | ICD-10-CM | POA: Insufficient documentation

## 2016-11-28 NOTE — Therapy (Signed)
[place] MAIN [facility] SERVICES [address] [place], [place], [address] Phone: [phone]   Fax:  [phone]  Occupational Therapy Evaluation  Patient Details  Name: Hannah Kirby MRN: [id number] Date of Birth: 09-10-1945 Referring Provider: Jobie Quaker Date: 11/24/2016      OT End of Session - 11/28/16 2130    Visit Number 1   Number of Visits 16   Date for OT Re-Evaluation 01/19/17   Authorization Type Medicare G code 1   OT Start Time 0900   OT Stop Time 0955   OT Time Calculation (min) 55 min   Activity Tolerance Patient tolerated treatment well   Behavior During Therapy [facility] for tasks assessed/performed      Past Medical History:  Diagnosis Date  . Anemia   . Arthritis   . CAD (coronary artery disease)    a. cath 02/2006: BMS x 2 to RCA, cath o/w without significant coronary disease; b. nuclear stress test 07/2014: no signs of ischemia, no ekg changes concerning for ischemia, low risk study/normal study  . Chronic bronchitis ([place])    secondary to cigarette smoking  . COPD (chronic obstructive pulmonary disease) ([place])   . Diabetes mellitus   . FHx: allergies   . GERD (gastroesophageal reflux disease)   . Goiter   . Granulomatous disease ([place])   . Hernia   . Hyperlipidemia   . Hypertension   . Kidney stone on left side 2013  . Microalbuminuria   . Obesity   . Spinal stenosis   . Stroke [facility]) 10/29/2016    Past Surgical History:  Procedure Laterality Date  . ABDOMINAL HYSTERECTOMY    . BREAST BIOPSY    . COLONOSCOPY WITH PROPOFOL N/A 09/23/2014   Procedure: COLONOSCOPY WITH PROPOFOL;  Surgeon: Lollie Sails, MD;  Location: [facility] ENDOSCOPY;  Service: Endoscopy;  Laterality: N/A;  . CORONARY ANGIOPLASTY WITH STENT PLACEMENT  2008  . ESOPHAGOGASTRODUODENOSCOPY (EGD) WITH PROPOFOL N/A 12/30/2014   Procedure: ESOPHAGOGASTRODUODENOSCOPY (EGD) WITH PROPOFOL;  Surgeon: Lollie Sails, MD;  Location: [facility]  ENDOSCOPY;  Service: Endoscopy;  Laterality: N/A;  . ESOPHAGOGASTRODUODENOSCOPY (EGD) WITH PROPOFOL N/A 07/19/2016   Procedure: ESOPHAGOGASTRODUODENOSCOPY (EGD) WITH PROPOFOL;  Surgeon: Lollie Sails, MD;  Location: [facility] ENDOSCOPY;  Service: Endoscopy;  Laterality: N/A;  . hysterectomy (other)      There were no vitals filed for this visit.      Subjective Assessment - 11/27/16 2126    Subjective  Patient reports she still feels weak on the left side, wants to get back to doing everything she did before. Will also be spending time at the beach at their house there and will need some home exercises.    Pertinent History Patient reports on October 29, 2016 she had some weakness in left arm and left leg.  They were driving back from the beach, had difficulty with grasping objects with her left hand.  Called doctor and she was advised to go to the ER.  She was admitted to [facility] and was hospitalized one night.     Patient Stated Goals Patient reports she would like to get the use of her arm back as close to 100% as she can.  Be independent.    Currently in Pain? Yes   Pain Score 0-No pain   Pain Location Back   Pain Orientation Right;Lower   Pain Descriptors / Indicators Aching   Pain Type Chronic pain   Pain Onset More than a month  ago   Pain Frequency Constant   Multiple Pain Sites No           [facility] OT Assessment - 11/27/16 2127      Assessment   Diagnosis CVA   Referring Provider Fisher,    Onset Date 10/29/16   Prior Therapy none     Precautions   Precautions Fall     Balance Screen   Has the patient fallen in the past 6 months No   Has the patient had a decrease in activity level because of a fear of falling?  No   Is the patient reluctant to leave their home because of a fear of falling?  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      ADL   Eating/Feeding Needs assist with cutting food   Grooming Modified independent   Upper Body Bathing Modified independent   Lower Body Bathing Modified independent   Upper Body Dressing Increased time   Lower Body Dressing Increased time   Toilet Transfer Modified independent   Toileting -  Hygiene Increase time   Tub/Shower Transfer Minimal assistance   ADL comments She has the most difficulty with styling her hair, holding a mirror in the left hand for her makeup, lifting filled pots/pans for cooking, putting items in the oven and removing, picking up and carrying her dog who is 15#, typing on the computer, opening jars and containers, holding grocery bags in left hand and carrying her purse.  She requires assistance with vacuuming, difficulty with reaching higher levels to dust furniture.       IADL   Prior Level of Function Shopping independent   Shopping Takes care of all shopping needs independently   Prior Level of Function Light Housekeeping independent   Light Housekeeping Performs light daily tasks such as dishwashing, bed making   Prior Level of Function Meal Prep independent   Meal Prep Able to complete simple warm meal prep   Community Mobility Drives own vehicle   Medication Management Is responsible for taking medication in correct dosages at correct time   Prior Level of Function Financial Management independent   Physiological scientist financial matters independently (budgets, writes checks, pays rent, bills goes to bank), collects and keeps track of income     Mobility    Mobility Status Needs assist   Mobility Status Comments feels "wobbly" at times, has not used any assisitive devices to date.      Written Expression   Dominant Hand Right     Vision - History   Visual History Cataracts   Additional Comments has cataracts in bilateral eyes but denies any changes since stroke     Cognition   Overall Cognitive Status Within Functional Limits for tasks assessed     Sensation   Light Touch Appears Intact   Stereognosis Appears Intact   Hot/Cold Appears Intact   Proprioception Appears Intact  Coordination   9 Hole Peg Test Right;Left   Right 9 Hole Peg Test 23   Left 9 Hole Peg Test 30     AROM   Overall AROM  Within functional limits for tasks performed     Strength   Overall Strength Deficits   Overall Strength Comments Right UE 4/5 overall, left UE 3+/5 overall.       Hand Function   Right Hand Grip (lbs) 41   Right Hand Lateral Pinch 12 lbs   Right Hand 3 Point Pinch 11 lbs   Left Hand Grip (lbs) 15   Left Hand Lateral Pinch 6 lbs   Left 3 point pinch 4 lbs        Patient ambulating without an assistive device but demonstrates decreased balance with transitional movements.                  OT Education - 11/27/16 2130    Education provided Yes   Education Details role of OT, goals   Person(s) Educated Patient   Methods Explanation             OT Long Term Goals - 11/28/16 2136      OT LONG TERM GOAL #1   Title Patient will improve coordination in left UE to be able to use curling iron to style hair with modified independence safely.     Baseline unable   Time 8   Period Weeks   Status New   Target Date 01/19/17     OT LONG TERM GOAL #2   Title Patient will improve left grip to hold mirror in left hand to perform makeup with modified independence.    Baseline difficulty holding at eval   Time 8   Period Weeks   Status New   Target Date 01/19/17     OT LONG TERM GOAL #3   Title Patient will  increase strength in left UE by 1 mm grade to be able to lift pots/pans in the kitchen with modified independence.    Baseline 3+/5 for left UE at eval   Time 8   Period Weeks   Status New   Target Date 01/19/17     OT LONG TERM GOAL #4   Title Patient will improve strength in bilateral UEs to be able to pick up and hold her dog securely.     Baseline unable to pick up at eval   Time 8   Period Weeks   Status New   Target Date 01/19/17     OT LONG TERM GOAL #5   Title Patient will be independent with home exercise program for strength and coordination.   Baseline none at eval   Time 4   Period Weeks   Status New   Target Date 01/19/17     Long Term Additional Goals   Additional Long Term Goals Yes     OT LONG TERM GOAL #6   Title Patient to improve coordination to be able to complete typing for email and online banking with modified independence.    Baseline slow to complete typing and use of keys correctly   Time 8   Period Weeks   Status New   Target Date 01/19/17               Plan - 11/27/16 2131    Clinical Impression Statement Patient is a 71 yo female who suffered a CVA on Sept 8th, 2018 with MRI finding as: Small acute  right basal ganglia region infarct.  Patient had a overnight stay in the hospital and was discharged home with her husband.  She was previously independent with all self-care and IADL tasks including driving.  She is currently retired.   She presents with muscle weakness, decreased coordination and decreased ability to perform daily tasks.  She has the most difficulty with styling her hair, holding a mirror in the left hand for her makeup, lifting filled pots/pans for cooking, putting items in the oven and removing, picking up and carrying her dog who is 15#, typing on the computer, opening jars and containers, holding grocery bags in left hand and carrying her purse. She alternates between living in [place] and spending every 2 weeks at the  beach.  Patient would benefit from OT services to maximize her safety and independence in daily tasks at home and in the community.     Occupational Profile and client history currently impacting functional performance co morbidities   Occupational performance deficits (Please refer to evaluation for details): ADL's;IADL's;Social Participation   Rehab Potential Excellent   Current Impairments/barriers affecting progress: back pain, out of town every 2 weeks for 10 days and will not be able to attend therapy during that time.    OT Frequency 2x / week   OT Duration 8 weeks   OT Treatment/Interventions Self-care/ADL training;Moist Heat;DME and/or AE instruction;Patient/family education;Therapeutic exercises;Balance training;Therapeutic exercise;Therapeutic activities;Neuromuscular education;Manual Therapy   Clinical Decision Making Limited treatment options, no task modification necessary   Consulted and Agree with Plan of Care Patient      Patient will benefit from skilled therapeutic intervention in order to improve the following deficits and impairments:  Decreased coordination, Decreased balance, Decreased knowledge of use of DME, Impaired UE functional use, Pain, Decreased mobility, Decreased strength  Visit Diagnosis: Muscle weakness (generalized)  Other lack of coordination  Hemiplegia and hemiparesis following cerebral infarction affecting left non-dominant side [facility])    Problem List Patient Active Problem List   Diagnosis Date Noted  . Cerebrovascular disease 11/08/2016  . Weakness of left upper extremity 10/30/2016  . AAA (abdominal aortic aneurysm) without rupture ([place]) 08/12/2016  . 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 Chronic kidney disease, stage 3 ([place]) 07/27/2015  . Chest pain at rest 05/06/2015  . 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[place]) 10/28/2014  . Solitary pulmonary nodule on lung CT 08/20/2014  . Allergic rhinitis 07/28/2014  . CAFL (chronic airflow  limitation) ([place]) 07/28/2014  . Abnormal liver enzymes 07/28/2014  . Acid reflux 07/28/2014  . Head revolving around 07/28/2014  . CAD (coronary artery disease)   . Obesity   . Granuloma annulare   . Back pain 11/12/2013  . Scoliosis 10/30/2013  . Lumbar radiculopathy 10/30/2013  . Lumbar canal stenosis 10/30/2013  . Calcium blood increased 10/22/2013  . Microalbuminuria 10/22/2013  . Smoking 03/21/2011  . Obese 03/21/2011  . Edema 08/18/2010  . Hyperlipidemia 03/25/2009  . Benign essential HTN 03/25/2009   Achilles Dunk, OTR/L, CLT  Yousof Alderman 11/28/2016, 9:42 PM  [place] MAIN [facility] SERVICES [address] [place], [place], [address] Phone: [phone]   Fax:  [phone]  Name: Hannah Kirby MRN: [id number] Date of Birth: Sep 03, 1945

## 2016-11-29 ENCOUNTER — Ambulatory Visit: Payer: Medicare Other | Admitting: Family Medicine

## 2016-11-29 ENCOUNTER — Ambulatory Visit: Payer: Medicare Other | Admitting: Occupational Therapy

## 2016-11-29 DIAGNOSIS — M6281 Muscle weakness (generalized): Secondary | ICD-10-CM

## 2016-11-29 DIAGNOSIS — I69354 Hemiplegia and hemiparesis following cerebral infarction affecting left non-dominant side: Secondary | ICD-10-CM | POA: Diagnosis not present

## 2016-11-29 DIAGNOSIS — R278 Other lack of coordination: Secondary | ICD-10-CM | POA: Diagnosis not present

## 2016-11-29 NOTE — Therapy (Signed)
[place] MAIN [facility] SERVICES [address] [place], [place], [address] Phone: [phone]   Fax:  [phone]  Occupational Therapy Treatment  Patient Details  Name: Hannah Kirby MRN: [id number] Date of Birth: [birth year] Referring Provider: Jobie Quaker Date: 11/29/2016      OT End of Session - 11/29/16 1052    Visit Number 2   Number of Visits 16   Date for OT Re-Evaluation 01/19/17   Authorization Type Medicare G code 2   OT Start Time 1000   OT Stop Time 1045   OT Time Calculation (min) 45 min   Activity Tolerance Patient tolerated treatment well   Behavior During Therapy [facility] for tasks assessed/performed      Past Medical History:  Diagnosis Date  . Anemia   . Arthritis   . CAD (coronary artery disease)    a. cath 02/2006: BMS x 2 to RCA, cath o/w without significant coronary disease; b. nuclear stress test 07/2014: no signs of ischemia, no ekg changes concerning for ischemia, low risk study/normal study  . Chronic bronchitis ([place])    secondary to cigarette smoking  . COPD (chronic obstructive pulmonary disease) ([place])   . Diabetes mellitus   . FHx: allergies   . GERD (gastroesophageal reflux disease)   . Goiter   . Granulomatous disease ([place])   . Hernia   . Hyperlipidemia   . Hypertension   . Kidney stone on left side 2013  . Microalbuminuria   . Obesity   . Spinal stenosis   . Stroke [facility]) 10/29/2016    Past Surgical History:  Procedure Laterality Date  . ABDOMINAL HYSTERECTOMY    . BREAST BIOPSY    . COLONOSCOPY WITH PROPOFOL N/A 09/23/2014   Procedure: COLONOSCOPY WITH PROPOFOL;  Surgeon: Lollie Sails, MD;  Location: [facility] ENDOSCOPY;  Service: Endoscopy;  Laterality: N/A;  . CORONARY ANGIOPLASTY WITH STENT PLACEMENT  2008  . ESOPHAGOGASTRODUODENOSCOPY (EGD) WITH PROPOFOL N/A 12/30/2014   Procedure: ESOPHAGOGASTRODUODENOSCOPY (EGD) WITH PROPOFOL;  Surgeon: Lollie Sails, MD;  Location: [facility]  ENDOSCOPY;  Service: Endoscopy;  Laterality: N/A;  . ESOPHAGOGASTRODUODENOSCOPY (EGD) WITH PROPOFOL N/A 07/19/2016   Procedure: ESOPHAGOGASTRODUODENOSCOPY (EGD) WITH PROPOFOL;  Surgeon: Lollie Sails, MD;  Location: [facility] ENDOSCOPY;  Service: Endoscopy;  Laterality: N/A;  . hysterectomy (other)      There were no vitals filed for this visit.      Subjective Assessment - 11/29/16 1039    Subjective  Pt. reports she had a bad weekend. Pt. reports that she had car trouble on the way to her family reunion, and never made it to the reunion.   Pertinent History Patient reports on October 29, 2016 she had some weakness in left arm and left leg.  They were driving back from the beach, had difficulty with grasping objects with her left hand.  Called doctor and she was advised to go to the ER.  She was admitted to [facility] and was hospitalized one night.     Patient Stated Goals Patient reports she would like to get the use of her arm back as close to 100% as she can.  Be independent.    Currently in Pain? Yes      OT TREATMENT    Neuro muscular re-education:  Pt. performed [place] tasks using the grooved pegboard. Pt. worked on grasping the grooved pegs from a horizontal position, and moving the pegs to a vertical position in the hand to prepare for placing  them in the grooved slot. Pt. worked on storing the pegs in the palm of her hand, and moving them through her hand.  Therapeutic Exercise:  Pt. performed 2# dowel ex. For UE strengthening secondary to weakness. Bilateral shoulder flexion, chest press, circular patterns, and elbow flexion/extension were performed. Pt. Performed hand strengthening with pink theraputty. Pt. required cues for proper technique. Pt. worked on gross grip loop, lateral pinch, 3pt. pinch, gross digit extension,  thumb opposition, and lumbical ex,  Pt. required verbal and tactile cues for proper technique. Pt. was provided with a visual  handout.                           OT Education - 11/29/16 1051    Education provided Yes   Education Details OT   Person(s) Educated Patient   Methods Explanation   Comprehension Verbalized understanding;Returned demonstration             OT Long Term Goals - 11/28/16 2136      OT LONG TERM GOAL #1   Title Patient will improve coordination in left UE to be able to use curling iron to style hair with modified independence safely.     Baseline unable   Time 8   Period Weeks   Status New   Target Date 01/19/17     OT LONG TERM GOAL #2   Title Patient will improve left grip to hold mirror in left hand to perform makeup with modified independence.    Baseline difficulty holding at eval   Time 8   Period Weeks   Status New   Target Date 01/19/17     OT LONG TERM GOAL #3   Title Patient will increase strength in left UE by 1 mm grade to be able to lift pots/pans in the kitchen with modified independence.    Baseline 3+/5 for left UE at eval   Time 8   Period Weeks   Status New   Target Date 01/19/17     OT LONG TERM GOAL #4   Title Patient will improve strength in bilateral UEs to be able to pick up and hold her dog securely.     Baseline unable to pick up at eval   Time 8   Period Weeks   Status New   Target Date 01/19/17     OT LONG TERM GOAL #5   Title Patient will be independent with home exercise program for strength and coordination.   Baseline none at eval   Time 4   Period Weeks   Status New   Target Date 01/19/17     Long Term Additional Goals   Additional Long Term Goals Yes     OT LONG TERM GOAL #6   Title Patient to improve coordination to be able to complete typing for email and online banking with modified independence.    Baseline slow to complete typing and use of keys correctly   Time 8   Period Weeks   Status New   Target Date 01/19/17               Plan - 11/29/16 1054    Clinical Impression Statement  Pt. reports she feels wobbly, and lightheaded in the morning. Pt. BP is 139/82. Pt. continues to benefit from skilled OT skilled services to work on improving strength, and coordination skills for improved functional use during ADLs, and IADLs.    Occupational performance deficits (Please  refer to evaluation for details): ADL's;IADL's;Social Participation   Rehab Potential Excellent   OT Frequency 2x / week   OT Duration 8 weeks   OT Treatment/Interventions Self-care/ADL training;Moist Heat;DME and/or AE instruction;Patient/family education;Therapeutic exercises;Balance training;Therapeutic exercise;Therapeutic activities;Neuromuscular education;Manual Therapy   Consulted and Agree with Plan of Care Patient      Patient will benefit from skilled therapeutic intervention in order to improve the following deficits and impairments:  Decreased coordination, Decreased balance, Decreased knowledge of use of DME, Impaired UE functional use, Pain, Decreased mobility, Decreased strength  Visit Diagnosis: Muscle weakness (generalized)  Other lack of coordination    Problem List Patient Active Problem List   Diagnosis Date Noted  . Cerebrovascular disease 11/08/2016  . Weakness of left upper extremity 10/30/2016  . AAA (abdominal aortic aneurysm) without rupture ([place]) 08/12/2016  . 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 Chronic kidney disease, stage 3 ([place]) 07/27/2015  . Chest pain at rest 05/06/2015  . 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[place]) 10/28/2014  . Solitary pulmonary nodule on lung CT 08/20/2014  . Allergic rhinitis 07/28/2014  . CAFL (chronic airflow limitation) ([place]) 07/28/2014  . Abnormal liver enzymes 07/28/2014  . Acid reflux 07/28/2014  . Head revolving around 07/28/2014  . CAD (coronary artery disease)   . Obesity   . Granuloma annulare   . Back pain 11/12/2013  . Scoliosis 10/30/2013  . Lumbar radiculopathy 10/30/2013  . Lumbar canal stenosis 10/30/2013  . Calcium  blood increased 10/22/2013  . Microalbuminuria 10/22/2013  . Smoking 03/21/2011  . Obese 03/21/2011  . Edema 08/18/2010  . Hyperlipidemia 03/25/2009  . Benign essential HTN 03/25/2009    Harrel Carina, MS, OTR/L 11/29/2016, 11:03 AM  [place] MAIN [facility] SERVICES [address] [place], [place], [address] Phone: [phone]   Fax:  [phone]  Name: Hannah Kirby MRN: [id number] Date of Birth: 10-Feb-1946

## 2016-12-01 ENCOUNTER — Ambulatory Visit: Payer: Medicare Other | Admitting: Occupational Therapy

## 2016-12-06 ENCOUNTER — Ambulatory Visit: Payer: Medicare Other | Admitting: Occupational Therapy

## 2016-12-08 ENCOUNTER — Ambulatory Visit: Payer: Medicare Other | Admitting: Occupational Therapy

## 2016-12-13 ENCOUNTER — Ambulatory Visit: Payer: Medicare Other | Admitting: Occupational Therapy

## 2016-12-13 DIAGNOSIS — I69354 Hemiplegia and hemiparesis following cerebral infarction affecting left non-dominant side: Secondary | ICD-10-CM | POA: Diagnosis not present

## 2016-12-13 DIAGNOSIS — R278 Other lack of coordination: Secondary | ICD-10-CM

## 2016-12-13 DIAGNOSIS — M6281 Muscle weakness (generalized): Secondary | ICD-10-CM

## 2016-12-13 NOTE — Therapy (Signed)
[place] MAIN [facility] SERVICES [address] [place], [place], [address] Phone: [phone]   Fax:  [phone]  Occupational Therapy Treatment  Patient Details  Name: Hannah Kirby MRN: [id number] Date of Birth: 09/17/1945 Referring Provider: Jobie Quaker Date: 12/13/2016      OT End of Session - 12/13/16 1149    Visit Number 3   Number of Visits 16   Date for OT Re-Evaluation 01/19/17   Authorization Type Medicare G code 3   OT Start Time 1100   OT Stop Time 1145   OT Time Calculation (min) 45 min   Activity Tolerance Patient tolerated treatment well   Behavior During Therapy [facility] for tasks assessed/performed      Past Medical History:  Diagnosis Date  . Anemia   . Arthritis   . CAD (coronary artery disease)    a. cath 02/2006: BMS x 2 to RCA, cath o/w without significant coronary disease; b. nuclear stress test 07/2014: no signs of ischemia, no ekg changes concerning for ischemia, low risk study/normal study  . Chronic bronchitis ([place])    secondary to cigarette smoking  . COPD (chronic obstructive pulmonary disease) ([place])   . Diabetes mellitus   . FHx: allergies   . GERD (gastroesophageal reflux disease)   . Goiter   . Granulomatous disease ([place])   . Hernia   . Hyperlipidemia   . Hypertension   . Kidney stone on left side 2013  . Microalbuminuria   . Obesity   . Spinal stenosis   . Stroke [facility]) 10/29/2016    Past Surgical History:  Procedure Laterality Date  . ABDOMINAL HYSTERECTOMY    . BREAST BIOPSY    . COLONOSCOPY WITH PROPOFOL N/A 09/23/2014   Procedure: COLONOSCOPY WITH PROPOFOL;  Surgeon: Lollie Sails, MD;  Location: [facility] ENDOSCOPY;  Service: Endoscopy;  Laterality: N/A;  . CORONARY ANGIOPLASTY WITH STENT PLACEMENT  2008  . ESOPHAGOGASTRODUODENOSCOPY (EGD) WITH PROPOFOL N/A 12/30/2014   Procedure: ESOPHAGOGASTRODUODENOSCOPY (EGD) WITH PROPOFOL;  Surgeon: Lollie Sails, MD;  Location: [facility]  ENDOSCOPY;  Service: Endoscopy;  Laterality: N/A;  . ESOPHAGOGASTRODUODENOSCOPY (EGD) WITH PROPOFOL N/A 07/19/2016   Procedure: ESOPHAGOGASTRODUODENOSCOPY (EGD) WITH PROPOFOL;  Surgeon: Lollie Sails, MD;  Location: [facility] ENDOSCOPY;  Service: Endoscopy;  Laterality: N/A;  . hysterectomy (other)      There were no vitals filed for this visit.      Subjective Assessment - 12/13/16 1148    Subjective  Pt. just returned from the beach. Pt. plans to return to the beach next week for her anniversary.   Pertinent History Patient reports on October 29, 2016 she had some weakness in left arm and left leg.  They were driving back from the beach, had difficulty with grasping objects with her left hand.  Called doctor and she was advised to go to the ER.  She was admitted to [facility] and was hospitalized one night.     Patient Stated Goals Patient reports she would like to get the use of her arm back as close to 100% as she can.  Be independent.    Currently in Pain? Yes   Pain Score 3    Pain Location Back   Pain Orientation Lower      OT TREATMENT    Neuro muscular re-education:  Pt. worked on tasks to sustain lateral pinch on resistive tweezers while grasping and moving 2" toothpick sticks from a horizontal flat position to a vertical position in  order to place it in the holder. Pt. was able to sustain grasp while positioning and extending the wrist/hand in the necessary alignment needed to place the stick through the top of the holder. Pt. Worked on grasping coins from a tabletop surface, placing them into a resistive container, and pushing them through the slot while isolating his 2nd digit. A resistive mat was placed under coins to aide in manipulating the coins and prevent sliding when picking them up.  Therapeutic Exercise:  Pt. performed hand strengthening with pink theraputty. Reviewed the HEP. Pt. required cues for proper technique. Pt. worked on gross grip loop, lateral pinch, 3pt.  pinch, gross digit extension, digit extension table spread, digit abduction loop, single digit extension loop, thumb opposition, and lumbical ex,  Pt. required verbal and tactile cues for proper technique. Pt. performed gross gripping with grip strengthener. Pt. worked on sustaining grip while grasping pegs and reaching at various heights. Gripper was placed in the 3rd resistive slot with the white resistive spring.Pt. Worked on pinch strengthening in the left hand for lateral, and 3pt. pinch using yellow, red, green, and blue resistive clips. Pt. worked on placing the clips at various vertical and horizontal angles. Tactile and verbal cues were required for eliciting the desired movement.                           OT Education - 12/13/16 1149    Education provided Yes   Education Details OT   Person(s) Educated Patient   Methods Explanation   Comprehension Verbalized understanding;Returned demonstration             OT Long Term Goals - 11/28/16 2136      OT LONG TERM GOAL #1   Title Patient will improve coordination in left UE to be able to use curling iron to style hair with modified independence safely.     Baseline unable   Time 8   Period Weeks   Status New   Target Date 01/19/17     OT LONG TERM GOAL #2   Title Patient will improve left grip to hold mirror in left hand to perform makeup with modified independence.    Baseline difficulty holding at eval   Time 8   Period Weeks   Status New   Target Date 01/19/17     OT LONG TERM GOAL #3   Title Patient will increase strength in left UE by 1 mm grade to be able to lift pots/pans in the kitchen with modified independence.    Baseline 3+/5 for left UE at eval   Time 8   Period Weeks   Status New   Target Date 01/19/17     OT LONG TERM GOAL #4   Title Patient will improve strength in bilateral UEs to be able to pick up and hold her dog securely.     Baseline unable to pick up at eval   Time 8    Period Weeks   Status New   Target Date 01/19/17     OT LONG TERM GOAL #5   Title Patient will be independent with home exercise program for strength and coordination.   Baseline none at eval   Time 4   Period Weeks   Status New   Target Date 01/19/17     Long Term Additional Goals   Additional Long Term Goals Yes     OT LONG TERM GOAL #6   Title Patient  to improve coordination to be able to complete typing for email and online banking with modified independence.    Baseline slow to complete typing and use of keys correctly   Time 8   Period Weeks   Status New   Target Date 01/19/17               Plan - 12/13/16 1150    Clinical Impression Statement Pt. just returned from a week at the beach. Pt. plans to return to [facility] next week for her anniversary. Pt. continues to work on improving LUE strength, pinch strength, and coordination skills. Pt. requires verbal cues, and visual demonstartion for proper technique. Pt. continues to work on improving UE functioning for ADL, and IADL training..   Occupational performance deficits (Please refer to evaluation for details): ADL's;IADL's;Social Participation   OT Frequency 2x / week   OT Duration 8 weeks   OT Treatment/Interventions Self-care/ADL training;Moist Heat;DME and/or AE instruction;Patient/family education;Therapeutic exercises;Balance training;Therapeutic exercise;Therapeutic activities;Neuromuscular education;Manual Therapy   Consulted and Agree with Plan of Care Patient      Patient will benefit from skilled therapeutic intervention in order to improve the following deficits and impairments:  Decreased coordination, Decreased balance, Decreased knowledge of use of DME, Impaired UE functional use, Pain, Decreased mobility, Decreased strength  Visit Diagnosis: Other lack of coordination  Muscle weakness (generalized)    Problem List Patient Active Problem List   Diagnosis Date Noted  .  Cerebrovascular disease 11/08/2016  . Weakness of left upper extremity 10/30/2016  . AAA (abdominal aortic aneurysm) without rupture ([place]) 08/12/2016  . 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 Chronic kidney disease, stage 3 ([place]) 07/27/2015  . Chest pain at rest 05/06/2015  . 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[place]) 10/28/2014  . Solitary pulmonary nodule on lung CT 08/20/2014  . Allergic rhinitis 07/28/2014  . CAFL (chronic airflow limitation) ([place]) 07/28/2014  . Abnormal liver enzymes 07/28/2014  . Acid reflux 07/28/2014  . Head revolving around 07/28/2014  . CAD (coronary artery disease)   . Obesity   . Granuloma annulare   . Back pain 11/12/2013  . Scoliosis 10/30/2013  . Lumbar radiculopathy 10/30/2013  . Lumbar canal stenosis 10/30/2013  . Calcium blood increased 10/22/2013  . Microalbuminuria 10/22/2013  . Smoking 03/21/2011  . Obese 03/21/2011  . Edema 08/18/2010  . Hyperlipidemia 03/25/2009  . Benign essential HTN 03/25/2009    Harrel Carina 12/13/2016, 12:03 PM  [place] MAIN [facility] SERVICES [address] [place], [place], [address] Phone: [phone]   Fax:  [phone]  Name: CORNELIA WALRAVEN MRN: [id number] Date of Birth: [birth year]

## 2016-12-15 ENCOUNTER — Ambulatory Visit: Payer: Medicare Other | Admitting: Occupational Therapy

## 2016-12-15 DIAGNOSIS — M6281 Muscle weakness (generalized): Secondary | ICD-10-CM

## 2016-12-15 DIAGNOSIS — R278 Other lack of coordination: Secondary | ICD-10-CM

## 2016-12-15 DIAGNOSIS — I69354 Hemiplegia and hemiparesis following cerebral infarction affecting left non-dominant side: Secondary | ICD-10-CM | POA: Diagnosis not present

## 2016-12-15 NOTE — Therapy (Signed)
[place] [facility]IN [facility] SERVICES [address] [place], [place], [address] Phone: [phone]   Fax:  [phone]  Occupational Therapy Treatment  Patient Details  Name: Hannah Kirby MRN: [id number] Date of Birth: Jul 21, 1945 Referring Provider: Jobie Quaker Date: 12/15/2016      OT End of Session - 12/15/16 1125    Visit Number 4   Date for OT Re-Evaluation 01/19/17   Authorization Type Medicare G code 3   OT Start Time 1100   OT Stop Time 1145   OT Time Calculation (min) 45 min   Activity Tolerance Patient tolerated treatment well   Behavior During Therapy [facility] for tasks assessed/performed      Past Medical History:  Diagnosis Date  . Anemia   . Arthritis   . CAD (coronary artery disease)    a. cath 02/2006: BMS x 2 to RCA, cath o/w without significant coronary disease; b. nuclear stress test 07/2014: no signs of ischemia, no ekg changes concerning for ischemia, low risk study/normal study  . Chronic bronchitis ([place])    secondary to cigarette smoking  . COPD (chronic obstructive pulmonary disease) ([place])   . Diabetes mellitus   . FHx: allergies   . GERD (gastroesophageal reflux disease)   . Goiter   . Granulomatous disease ([place])   . Hernia   . Hyperlipidemia   . Hypertension   . Kidney stone on left side 2013  . Microalbuminuria   . Obesity   . Spinal stenosis   . Stroke [facility]) 10/29/2016    Past Surgical History:  Procedure Laterality Date  . ABDOMINAL HYSTERECTOMY    . BREAST BIOPSY    . COLONOSCOPY WITH PROPOFOL N/A 09/23/2014   Procedure: COLONOSCOPY WITH PROPOFOL;  Surgeon: Lollie Sails, MD;  Location: [facility] ENDOSCOPY;  Service: Endoscopy;  Laterality: N/A;  . CORONARY ANGIOPLASTY WITH STENT PLACEMENT  2008  . ESOPHAGOGASTRODUODENOSCOPY (EGD) WITH PROPOFOL N/A 12/30/2014   Procedure: ESOPHAGOGASTRODUODENOSCOPY (EGD) WITH PROPOFOL;  Surgeon: Lollie Sails, MD;  Location: [facility] ENDOSCOPY;  Service: Endoscopy;   Laterality: N/A;  . ESOPHAGOGASTRODUODENOSCOPY (EGD) WITH PROPOFOL N/A 07/19/2016   Procedure: ESOPHAGOGASTRODUODENOSCOPY (EGD) WITH PROPOFOL;  Surgeon: Lollie Sails, MD;  Location: [facility] ENDOSCOPY;  Service: Endoscopy;  Laterality: N/A;  . hysterectomy (other)      There were no vitals filed for this visit.      Subjective Assessment - 12/15/16 1117    Subjective  Pt. reprts being lightheaded today.   Pertinent History Patient reports on October 29, 2016 she had some weakness in left arm and left leg.  They were driving back from the beach, had difficulty with grasping objects with her left hand.  Called doctor and she was advised to go to the ER.  She was admitted to [facility] and was hospitalized one night.     Patient Stated Goals Patient reports she would like to get the use of her arm back as close to 100% as she can.  Be independent.    Currently in Pain? No/denies      OT TREATMENT    Neuro muscular re-education:  Pt. performed Left [facility] skills training to improve speed and dexterity needed for ADL tasks and writing. Pt. demonstrated grasping 1 inch sticks,  inch cylindrical collars, and  inch flat washers on the [place] pegboard. Pt. performed grasping each item with her 2nd digit and thumb, and storing them in the palm. Pt. presented with difficulty storing  inch objects at a  time in the palmar aspect of the hand.   Therapeutic Exercise:  Pt. performed gross gripping with grip strengthener. Pt. worked on sustaining grip while grasping pegs and reaching at various heights. Gripper was placed in the 3rd resistive slot with the white resistive spring. Pt. Worked on pinch strengthening in the left hand for lateral, and 3pt. pinch using red, green, and blue resistive clips. Pt. worked on placing the clips at various vertical and horizontal angles. Tactile and verbal cues were required for eliciting the desired movement. Pt. performed resistive EZ Board exercises for forearm  supination/pronation, wrist flexion/extension using gross grasp, and lateral pinch (key) grasp. Pt. performed resistive EZ Board exercises angled in several planes to promote shoulder flexion, abduction, and wrist flexion, and extension while performing resistive wrist flexion and extension with a gross grip.                            OT Education - 12/15/16 1119    Education provided Yes   Education Details LUE strength, coordination   Person(s) Educated Patient   Methods Explanation   Comprehension Verbalized understanding;Returned demonstration             OT Long Term Goals - 11/28/16 2136      OT LONG TERM GOAL #1   Title Patient will improve coordination in left UE to be able to use curling iron to style hair with modified independence safely.     Baseline unable   Time 8   Period Weeks   Status New   Target Date 01/19/17     OT LONG TERM GOAL #2   Title Patient will improve left grip to hold mirror in left hand to perform makeup with modified independence.    Baseline difficulty holding at eval   Time 8   Period Weeks   Status New   Target Date 01/19/17     OT LONG TERM GOAL #3   Title Patient will increase strength in left UE by 1 mm grade to be able to lift pots/pans in the kitchen with modified independence.    Baseline 3+/5 for left UE at eval   Time 8   Period Weeks   Status New   Target Date 01/19/17     OT LONG TERM GOAL #4   Title Patient will improve strength in bilateral UEs to be able to pick up and hold her dog securely.     Baseline unable to pick up at eval   Time 8   Period Weeks   Status New   Target Date 01/19/17     OT LONG TERM GOAL #5   Title Patient will be independent with home exercise program for strength and coordination.   Baseline none at eval   Time 4   Period Weeks   Status New   Target Date 01/19/17     Long Term Additional Goals   Additional Long Term Goals Yes     OT LONG TERM GOAL #6   Title  Patient to improve coordination to be able to complete typing for email and online banking with modified independence.    Baseline slow to complete typing and use of keys correctly   Time 8   Period Weeks   Status New   Target Date 01/19/17               Plan - 12/15/16 1125    Clinical Impression Statement Pt. plans  to go to the beach this next week. Pt. has been having dificulty sustaining her LUE in elevation while curling her hair. Pt. has difficulty with lateral pinch, [facility] skills, and translatory movements of the hand. Pt. continues to work on improving her LUE strength, and [facility] skills. for improved ADLs, and IADLs.   Occupational performance deficits (Please refer to evaluation for details): ADL's;IADL's;Social Participation   Rehab Potential Excellent   Current Impairments/barriers affecting progress: back pain, out of town every 2 weeks for 10 days and will not be able to attend therapy during that time.    OT Frequency 2x / week   OT Duration 8 weeks   OT Treatment/Interventions Self-care/ADL training;Moist Heat;DME and/or AE instruction;Patient/family education;Therapeutic exercises;Balance training;Therapeutic exercise;Therapeutic activities;Neuromuscular education;Manual Therapy   Consulted and Agree with Plan of Care Patient      Patient will benefit from skilled therapeutic intervention in order to improve the following deficits and impairments:  Decreased coordination, Decreased balance, Decreased knowledge of use of DME, Impaired UE functional use, Pain, Decreased mobility, Decreased strength  Visit Diagnosis: Muscle weakness (generalized)  Other lack of coordination    Problem List Patient Active Problem List   Diagnosis Date Noted  . Cerebrovascular disease 11/08/2016  . Weakness of left upper extremity 10/30/2016  . AAA (abdominal aortic aneurysm) without rupture ([place]) 08/12/2016  . 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 Chronic kidney disease, stage 3 ([place])  07/27/2015  . Chest pain at rest 05/06/2015  . 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[place]) 10/28/2014  . Solitary pulmonary nodule on lung CT 08/20/2014  . Allergic rhinitis 07/28/2014  . CAFL (chronic airflow limitation) ([place]) 07/28/2014  . Abnormal liver enzymes 07/28/2014  . Acid reflux 07/28/2014  . Head revolving around 07/28/2014  . CAD (coronary artery disease)   . Obesity   . Granuloma annulare   . Back pain 11/12/2013  . Scoliosis 10/30/2013  . Lumbar radiculopathy 10/30/2013  . Lumbar canal stenosis 10/30/2013  . Calcium blood increased 10/22/2013  . Microalbuminuria 10/22/2013  . Smoking 03/21/2011  . Obese 03/21/2011  . Edema 08/18/2010  . Hyperlipidemia 03/25/2009  . Benign essential HTN 03/25/2009    Harrel Carina, MS, OTR/L 12/15/2016, 11:55 AM  [place] MAIN [facility] SERVICES [address] [place], [place], [address] Phone: [phone]   Fax:  [phone]  Name: Hannah Kirby MRN: [id number] Date of Birth: [birth year]

## 2016-12-20 ENCOUNTER — Encounter: Payer: Medicare Other | Admitting: Occupational Therapy

## 2016-12-22 ENCOUNTER — Encounter: Payer: Medicare Other | Admitting: Occupational Therapy

## 2016-12-28 ENCOUNTER — Ambulatory Visit: Payer: Medicare Other | Attending: Family Medicine | Admitting: Occupational Therapy

## 2016-12-28 ENCOUNTER — Encounter: Payer: Self-pay | Admitting: Occupational Therapy

## 2016-12-28 DIAGNOSIS — M6281 Muscle weakness (generalized): Secondary | ICD-10-CM | POA: Insufficient documentation

## 2016-12-28 DIAGNOSIS — I69354 Hemiplegia and hemiparesis following cerebral infarction affecting left non-dominant side: Secondary | ICD-10-CM | POA: Insufficient documentation

## 2016-12-28 DIAGNOSIS — R278 Other lack of coordination: Secondary | ICD-10-CM | POA: Insufficient documentation

## 2016-12-29 DIAGNOSIS — H2513 Age-related nuclear cataract, bilateral: Secondary | ICD-10-CM | POA: Diagnosis not present

## 2016-12-29 LAB — HM DIABETES EYE EXAM

## 2016-12-29 LAB — HM MAMMOGRAPHY

## 2016-12-30 ENCOUNTER — Ambulatory Visit: Payer: Medicare Other | Admitting: Occupational Therapy

## 2016-12-30 DIAGNOSIS — R278 Other lack of coordination: Secondary | ICD-10-CM | POA: Diagnosis not present

## 2016-12-30 DIAGNOSIS — M6281 Muscle weakness (generalized): Secondary | ICD-10-CM | POA: Diagnosis not present

## 2016-12-30 DIAGNOSIS — I69354 Hemiplegia and hemiparesis following cerebral infarction affecting left non-dominant side: Secondary | ICD-10-CM | POA: Diagnosis not present

## 2016-12-31 ENCOUNTER — Encounter: Payer: Self-pay | Admitting: Occupational Therapy

## 2016-12-31 NOTE — Therapy (Signed)
[place] MAIN [facility] SERVICES [address] [place], [place], [address] Phone: [phone]   Fax:  [phone]  Occupational Therapy Treatment  Patient Details  Name: MARIAN MENEELY MRN: [id number] Date of Birth: 02/27/1945 Referring Provider: Jobie Quaker Date: 12/28/2016  OT End of Session - 12/31/16 1717    Visit Number  5    Number of Visits  16    Date for OT Re-Evaluation  01/19/17    Authorization Type  Medicare G code 5    OT Start Time  1003    OT Stop Time  1048    OT Time Calculation (min)  45 min    Activity Tolerance  Patient tolerated treatment well    Behavior During Therapy  [facility] for tasks assessed/performed       Past Medical History:  Diagnosis Date  . Anemia   . Arthritis   . CAD (coronary artery disease)    a. cath 02/2006: BMS x 2 to RCA, cath o/w without significant coronary disease; b. nuclear stress test 07/2014: no signs of ischemia, no ekg changes concerning for ischemia, low risk study/normal study  . Chronic bronchitis ([place])    secondary to cigarette smoking  . COPD (chronic obstructive pulmonary disease) ([place])   . Diabetes mellitus   . FHx: allergies   . GERD (gastroesophageal reflux disease)   . Goiter   . Granulomatous disease ([place])   . Hernia   . Hyperlipidemia   . Hypertension   . Kidney stone on left side 2013  . Microalbuminuria   . Obesity   . Spinal stenosis   . Stroke [facility]) 10/29/2016    Past Surgical History:  Procedure Laterality Date  . ABDOMINAL HYSTERECTOMY    . BREAST BIOPSY    . CORONARY ANGIOPLASTY WITH STENT PLACEMENT  2008  . hysterectomy (other)      There were no vitals filed for this visit.  Subjective Assessment - 12/31/16 1712    Subjective   Patient reports she had a nice time at the beach.  She has been trying to use her hand as much as possible with everything she does.      Pertinent History  Patient reports on October 29, 2016 she had some weakness in  left arm and left leg.  They were driving back from the beach, had difficulty with grasping objects with her left hand.  Called doctor and she was advised to go to the ER.  She was admitted to [facility] and was hospitalized one night.      Patient Stated Goals  Patient reports she would like to get the use of her arm back as close to 100% as she can.  Be independent.     Currently in Pain?  No/denies    Pain Score  0-No pain                   OT Treatments/Exercises (OP) - 12/31/16 1713      Fine Motor Coordination   Other Fine Motor Exercises  Patient seen for coordination exercises with left hand to manipulate [place] discs from tabletop, flipping and turning to opposite sides with left hand and focus on speed of task.  She then performed with both hands attempting to complete task simulateously.  Cues for technique.        Neurological Re-education Exercises   Other Exercises 1  Reassessment of grip strength on left 32# (was 15# at eval), lateral  pinch 9# (up from 6), 3 point pinch 8# up from 4#.  She was able to complete 9 hole peg test in 29 secs.      Other Exercises 2  Grip strengthening exercises with 17.9# for 25 reps then 23.4# for 10 reps.  Place a removed push pins into moderate resistive board with cues for technique.               OT Education - 12/31/16 1716    Education provided  Yes    Education Details  HEP, coordination/strength    Person(s) Educated  Patient    Methods  Explanation;Demonstration;Verbal cues    Comprehension  Verbal cues required;Returned demonstration;Verbalized understanding          OT Long Term Goals - 12/30/16 1037      OT LONG TERM GOAL #1   Title  Patient will improve coordination in left UE to be able to use curling iron to style hair with modified independence safely.      Baseline  curling her hair now, hand fatigues and has difficulty with coordination of movement    Time  8    Period  Weeks    Status  Partially Met       OT LONG TERM GOAL #2   Title  Patient will improve left grip to hold mirror in left hand to perform makeup with modified independence.     Baseline  still shaky at times     Time  8    Period  Weeks    Status  Partially Met      OT LONG TERM GOAL #3   Title  Patient will increase strength in left UE by 1 mm grade to be able to lift pots/pans in the kitchen with modified independence.     Baseline  3+/5 for left UE at eval, now performing but occasionally has fatigue when emptying the pot    Time  8    Period  Weeks    Status  Partially Met      OT LONG TERM GOAL #4   Title  Patient will improve strength in bilateral UEs to be able to pick up and hold her dog securely.      Baseline  unable to pick up at eval, able to pick up dog with both hands now on 11/7    Time  8    Period  Weeks    Status  Achieved      OT LONG TERM GOAL #5   Title  Patient will be independent with home exercise program for strength and coordination.    Baseline  none at eval    Time  4    Period  Weeks    Status  Partially Met      OT LONG TERM GOAL #6   Title  Patient to improve coordination to be able to complete typing for email and online banking with modified independence.     Baseline  slow to complete typing and use of keys correctly, better but still somewhat slow to complete tasks.    Time  8    Period  Weeks    Status  Partially Met            Plan - 12/31/16 1717    Clinical Impression Statement  Patient continues to progress with goals, she reports she is able to hold her mirror in her left hand but she feels it is unsteady/shaky.  With kitchen  tasks she fatigues quickly and is unable to hold heavier objects for increased time to pour from one pot to another.  She has made significant progress with her grip and pinch strength since starting therapy.  Continue to work towards goals.     Occupational performance deficits (Please refer to evaluation for details):  ADL's;IADL's;Leisure        Patient will benefit from skilled therapeutic intervention in order to improve the following deficits and impairments:  Decreased coordination, Decreased balance, Decreased knowledge of use of DME, Impaired UE functional use, Pain, Decreased mobility, Decreased strength  Visit Diagnosis: Muscle weakness (generalized)  Other lack of coordination  Hemiplegia and hemiparesis following cerebral infarction affecting left non-dominant side [facility])    Problem List Patient Active Problem List   Diagnosis Date Noted  . Cerebrovascular disease 11/08/2016  . Weakness of left upper extremity 10/30/2016  . AAA (abdominal aortic aneurysm) without rupture ([place]) 08/12/2016  . 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 Chronic kidney disease, stage 3 ([place]) 07/27/2015  . Chest pain at rest 05/06/2015  . 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[place]) 10/28/2014  . Solitary pulmonary nodule on lung CT 08/20/2014  . Allergic rhinitis 07/28/2014  . CAFL (chronic airflow limitation) ([place]) 07/28/2014  . Abnormal liver enzymes 07/28/2014  . Acid reflux 07/28/2014  . Head revolving around 07/28/2014  . CAD (coronary artery disease)   . Obesity   . Granuloma annulare   . Back pain 11/12/2013  . Scoliosis 10/30/2013  . Lumbar radiculopathy 10/30/2013  . Lumbar canal stenosis 10/30/2013  . Calcium blood increased 10/22/2013  . Microalbuminuria 10/22/2013  . Smoking 03/21/2011  . Obese 03/21/2011  . Edema 08/18/2010  . Hyperlipidemia 03/25/2009  . Benign essential HTN 03/25/2009   Achilles Dunk, OTR/L, CLT  Cherene Dobbins 12/31/2016, 5:21 PM  [place] MAIN [facility] SERVICES [address] [place], [place], [address] Phone: [phone]   Fax:  [phone]  Name: THELDA GAGAN MRN: [id number] Date of Birth: 1945-03-05

## 2016-12-31 NOTE — Therapy (Signed)
[place] MAIN [facility] SERVICES [address] [place], [place], [address] Phone: [phone]   Fax:  [phone]  Occupational Therapy Treatment  Patient Details  Name: Hannah Kirby MRN: [id number] Date of Birth: [birth year] Referring Provider: Jobie Quaker Date: 12/30/2016  OT End of Session - 12/31/16 1731    Visit Number  6    Number of Visits  16    Date for OT Re-Evaluation  01/19/17    Authorization Type  Medicare G code 6    OT Start Time  1000    OT Stop Time  1046    OT Time Calculation (min)  46 min    Activity Tolerance  Patient tolerated treatment well    Behavior During Therapy  [facility] for tasks assessed/performed       Past Medical History:  Diagnosis Date  . Anemia   . Arthritis   . CAD (coronary artery disease)    a. cath 02/2006: BMS x 2 to RCA, cath o/w without significant coronary disease; b. nuclear stress test 07/2014: no signs of ischemia, no ekg changes concerning for ischemia, low risk study/normal study  . Chronic bronchitis ([employer])    secondary to cigarette smoking  . COPD (chronic obstructive pulmonary disease) ([place])   . Diabetes mellitus   . FHx: allergies   . GERD (gastroesophageal reflux disease)   . Goiter   . Granulomatous disease ([place])   . Hernia   . Hyperlipidemia   . Hypertension   . Kidney stone on left side 2013  . Microalbuminuria   . Obesity   . Spinal stenosis   . Stroke [facility]) 10/29/2016    Past Surgical History:  Procedure Laterality Date  . ABDOMINAL HYSTERECTOMY    . BREAST BIOPSY    . CORONARY ANGIOPLASTY WITH STENT PLACEMENT  2008  . hysterectomy (other)      There were no vitals filed for this visit.  Subjective Assessment - 12/31/16 1726    Subjective   Patient reports she would like to continue with exercises at home but she would like to keep her chart open in case she has any problems or questions and needs to return.      Pertinent History  Patient reports on  October 29, 2016 she had some weakness in left arm and left leg.  They were driving back from the beach, had difficulty with grasping objects with her left hand.  Called doctor and she was advised to go to the ER.  She was admitted to [facility] and was hospitalized one night.      Patient Stated Goals  Patient reports she would like to get the use of her arm back as close to 100% as she can.  Be independent.     Currently in Pain?  No/denies    Pain Score  0-No pain                   OT Treatments/Exercises (OP) - 12/31/16 1727      Fine Motor Coordination   Other Fine Motor Exercises  Patient seen for coordination exercises with left hand to manipulate coins from tabletop, picking up, moving to palm and back to fingertips to place into resistive bank.   Patient seen for manipulation of small pieces of PUrdue, picking up from a magnetic bowl and placing into board, when removing, sorting with fingers.  Patient educated on home program for speed and coordination.  Instructed on use of  tennis ball for speed drills. Cues for technique.        Neurological Re-education Exercises   Other Exercises 1  Patient performing resistive reciprocal arm bike with resitance of 3.0 to 4.0 for 6 minutes, forwards/backwards and with therapist in constant attendance to adjust settings and ensure grip on left side.  Patient performing resistive pinch pins all levels except black with left hand to place onto elevated surface.  Patient educated on HEP for strength of LUE, handouts given.               OT Education - 12/31/16 1731    Education provided  Yes    Education Details  HEP    Person(s) Educated  Patient    Methods  Explanation;Demonstration;Verbal cues    Comprehension  Verbal cues required;Returned demonstration;Verbalized understanding          OT Long Term Goals - 12/31/16 1734      OT LONG TERM GOAL #1   Title  Patient will improve coordination in left UE to be able to use curling  iron to style hair with modified independence safely.      Baseline  curling her hair now, hand fatigues and has difficulty with coordination of movement    Time  8    Period  Weeks    Status  Partially Met      OT LONG TERM GOAL #2   Title  Patient will improve left grip to hold mirror in left hand to perform makeup with modified independence.     Baseline  still shaky at times     Time  8    Period  Weeks    Status  Partially Met      OT LONG TERM GOAL #3   Title  Patient will increase strength in left UE by 1 mm grade to be able to lift pots/pans in the kitchen with modified independence.     Baseline  3+/5 for left UE at eval, now performing but occasionally has fatigue when emptying the pot    Time  8    Period  Weeks    Status  Partially Met      OT LONG TERM GOAL #4   Title  Patient will improve strength in bilateral UEs to be able to pick up and hold her dog securely.      Baseline  unable to pick up at eval, able to pick up dog with both hands now on 11/7    Time  8    Period  Weeks    Status  Achieved      OT LONG TERM GOAL #5   Title  Patient will be independent with home exercise program for strength and coordination.    Baseline  none at eval    Time  4    Period  Weeks    Status  Partially Met      OT LONG TERM GOAL #6   Title  Patient to improve coordination to be able to complete typing for email and online banking with modified independence.     Baseline  slow to complete typing and use of keys correctly, better but still somewhat slow to complete tasks.    Time  8    Period  Weeks    Status  Partially Met            Plan - 12/31/16 1732    Clinical Impression Statement  Patient's goals updated.  Patient has  made significant progress in grip and coordination of left hand and is improving with functional tasks around the house. She still has some mild deficits and wants to continue to work on HEP at home.  Will leave her chart open for a couple weeks  in case she feels she needs to return for reinstruction or advancement in home program.      Occupational performance deficits (Please refer to evaluation for details):  ADL's;IADL's;Leisure    Rehab Potential  Excellent    Current Impairments/barriers affecting progress:  back pain, out of town every 2 weeks for 10 days and will not be able to attend therapy during that time.     OT Frequency  2x / week    OT Duration  8 weeks    OT Treatment/Interventions  Self-care/ADL training;Moist Heat;DME and/or AE instruction;Patient/family education;Therapeutic exercises;Balance training;Therapeutic exercise;Therapeutic activities;Neuromuscular education;Manual Therapy    Consulted and Agree with Plan of Care  Patient       Patient will benefit from skilled therapeutic intervention in order to improve the following deficits and impairments:  Decreased coordination, Decreased balance, Decreased knowledge of use of DME, Impaired UE functional use, Pain, Decreased mobility, Decreased strength  Visit Diagnosis: Muscle weakness (generalized)  Other lack of coordination  Hemiplegia and hemiparesis following cerebral infarction affecting left non-dominant side [facility])    Problem List Patient Active Problem List   Diagnosis Date Noted  . Cerebrovascular disease 11/08/2016  . Weakness of left upper extremity 10/30/2016  . AAA (abdominal aortic aneurysm) without rupture ([place]) 08/12/2016  . 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 Chronic kidney disease, stage 3 ([place]) 07/27/2015  . Chest pain at rest 05/06/2015  . 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[place]) 10/28/2014  . Solitary pulmonary nodule on lung CT 08/20/2014  . Allergic rhinitis 07/28/2014  . CAFL (chronic airflow limitation) ([place]) 07/28/2014  . Abnormal liver enzymes 07/28/2014  . Acid reflux 07/28/2014  . Head revolving around 07/28/2014  . CAD (coronary artery disease)   . Obesity   . Granuloma annulare   . Back pain 11/12/2013  .  Scoliosis 10/30/2013  . Lumbar radiculopathy 10/30/2013  . Lumbar canal stenosis 10/30/2013  . Calcium blood increased 10/22/2013  . Microalbuminuria 10/22/2013  . Smoking 03/21/2011  . Obese 03/21/2011  . Edema 08/18/2010  . Hyperlipidemia 03/25/2009  . Benign essential HTN 03/25/2009   Achilles Dunk, OTR/L, CLT  Mikie Misner 12/31/2016, 5:34 PM  [place] MAIN [facility] SERVICES [address] [place], [place], [address] Phone: [phone]   Fax:  [phone]  Name: Hannah Kirby MRN: [id number] Date of Birth: November 11, 1945

## 2017-01-03 ENCOUNTER — Encounter: Payer: Self-pay | Admitting: *Deleted

## 2017-01-23 ENCOUNTER — Encounter: Payer: Self-pay | Admitting: Family Medicine

## 2017-02-08 ENCOUNTER — Other Ambulatory Visit (INDEPENDENT_AMBULATORY_CARE_PROVIDER_SITE_OTHER): Payer: Self-pay | Admitting: Vascular Surgery

## 2017-02-08 DIAGNOSIS — I714 Abdominal aortic aneurysm, without rupture, unspecified: Secondary | ICD-10-CM

## 2017-02-10 ENCOUNTER — Encounter (INDEPENDENT_AMBULATORY_CARE_PROVIDER_SITE_OTHER): Payer: Self-pay | Admitting: Vascular Surgery

## 2017-02-10 ENCOUNTER — Ambulatory Visit (INDEPENDENT_AMBULATORY_CARE_PROVIDER_SITE_OTHER): Payer: Medicare Other | Admitting: Vascular Surgery

## 2017-02-10 ENCOUNTER — Ambulatory Visit (INDEPENDENT_AMBULATORY_CARE_PROVIDER_SITE_OTHER): Payer: Medicare Other

## 2017-02-10 VITALS — BP 119/72 | HR 77 | Resp 16 | Wt 161.8 lb

## 2017-02-10 DIAGNOSIS — I2510 Atherosclerotic heart disease of native coronary artery without angina pectoris: Secondary | ICD-10-CM | POA: Diagnosis not present

## 2017-02-10 DIAGNOSIS — E7849 Other hyperlipidemia: Secondary | ICD-10-CM

## 2017-02-10 DIAGNOSIS — I714 Abdominal aortic aneurysm, without rupture, unspecified: Secondary | ICD-10-CM

## 2017-02-10 DIAGNOSIS — F17210 Nicotine dependence, cigarettes, uncomplicated: Secondary | ICD-10-CM

## 2017-02-10 DIAGNOSIS — I10 Essential (primary) hypertension: Secondary | ICD-10-CM | POA: Diagnosis not present

## 2017-02-10 DIAGNOSIS — I639 Cerebral infarction, unspecified: Secondary | ICD-10-CM

## 2017-02-10 NOTE — Assessment & Plan Note (Signed)
Her abdominal aortic aneurysm is stable today measuring 3.57 cm in maximal diameter in the proximal abdominal aorta. No surgery or intervention at this time. The patient has an asymptomatic abdominal aortic aneurysm that is less than 4 cm in maximal diameter.  I have discussed the natural history of abdominal aortic aneurysm and the small risk of rupture for aneurysm less than 5 cm in size.  However, as these small aneurysms tend to enlarge over time, continued surveillance with ultrasound or CT scan is mandatory.  I have also discussed optimizing medical management with hypertension and lipid control and the importance of abstinence from tobacco.  The patient is also encouraged to exercise a minimum of 30 minutes 4 times a week.  Should the patient develop new onset abdominal or back pain or signs of peripheral embolization they are instructed to seek medical attention immediately and to alert the physician providing care that they have an aneurysm.  The patient voices their understanding. The patient will return in 12 months with an aortic duplex.

## 2017-02-10 NOTE — Assessment & Plan Note (Signed)
Mild residual deficits

## 2017-02-10 NOTE — Patient Instructions (Signed)
Abdominal Aortic Aneurysm Blood pumps away from the heart through tubes (blood vessels) called arteries. Aneurysms are weak or damaged places in the wall of an artery. It bulges out like a balloon. An abdominal aortic aneurysm happens in the main artery of the body (aorta). It can burst or tear, causing bleeding inside the body. This is an emergency. It needs treatment right away. What are the causes? The exact cause is unknown. Things that could cause this problem include:  Fat and other substances building up in the lining of a tube.  Swelling of the walls of a blood vessel.  Certain tissue diseases.  Belly (abdominal) trauma.  An infection in the main artery of the body.  What increases the risk? There are things that make it more likely for you to have an aneurysm. These include:  Being over the age of 70 years old.  Having high blood pressure (hypertension).  Being a female.  Being white.  Being very overweight (obese).  Having a family history of aneurysm.  Using tobacco products.  What are the signs or symptoms? Symptoms depend on the size of the aneurysm and how fast it grows. There may not be symptoms. If symptoms occur, they can include:  Pain (belly, side, lower back, or groin).  Feeling full after eating a small amount of food.  Feeling sick to your stomach (nauseous), throwing up (vomiting), or both.  Feeling a lump in your belly that feels like it is beating (pulsating).  Feeling like you will pass out (faint).  How is this treated?  Medicine to control blood pressure and pain.  Imaging tests to see if the aneurysm gets bigger.  Surgery. How is this prevented? To lessen your chance of getting this condition:  Stop smoking. Stop chewing tobacco.  Limit or avoid alcohol.  Keep your blood pressure, blood sugar, and cholesterol within normal limits.  Eat less salt.  Eat foods low in saturated fats and cholesterol. These are found in animal and  whole dairy products.  Eat more fiber. Fiber is found in whole grains, vegetables, and fruits.  Keep a healthy weight.  Stay active and exercise often.  This information is not intended to replace advice given to you by your health care provider. Make sure you discuss any questions you have with your health care provider. Document Released: 06/04/2012 Document Revised: 07/16/2015 Document Reviewed: 03/09/2012 Elsevier Interactive Patient Education  2017 Elsevier Inc.  

## 2017-02-10 NOTE — Progress Notes (Signed)
MRN : [id number]  Hannah Kirby is a 71 y.o. (1945/09/11) female who presents with chief complaint of  Chief Complaint  Patient presents with  . AAA    37mth follow up  .  History of Present Illness: Patient returns today in follow up of her abdominal aortic aneurysm.  Since her last visit, she has had a mild stroke.  She has some very slight residual deficits although they are not easy to appreciate.  She reports no aneurysm related symptoms. Specifically, the patient denies new back or abdominal pain, or signs of peripheral embolization.  She does continue to smoke although she and her husband said she has cut well back.  Her aortic duplex today demonstrates a stable 3.57 cm abdominal aortic aneurysm.  Current Outpatient Medications  Medication Sig Dispense Refill  . albuterol (VENTOLIN HFA) 108 (90 Base) MCG/ACT inhaler Inhale 2 puffs into the lungs every 4 (four) hours as needed for wheezing or shortness of breath. 48 g 3  . amLODipine (NORVASC) 5 MG tablet Take 1 tablet (5 mg total) by mouth daily. 90 tablet 3  . aspirin (ASPIR-81) 81 MG EC tablet Take 81 mg by mouth daily.      . clopidogrel (PLAVIX) 75 MG tablet Take 1 tablet (75 mg total) by mouth daily. 90 tablet 3  . ezetimibe (ZETIA) 10 MG tablet Take 1 tablet (10 mg total) by mouth daily. 90 tablet 3  . fluticasone furoate-vilanterol (BREO ELLIPTA) 100-25 MCG/INH AEPB Inhale 1 puff into the lungs daily. 84 each 3  . HYDROcodone-acetaminophen (NORCO/VICODIN) 5-325 MG tablet Take 1 tablet by mouth every 6 (six) hours as needed. 120 tablet 0  . insulin glargine (LANTUS) 100 UNIT/ML injection Inject 12 Units into the skin daily.     . Liraglutide (VICTOZA) 18 MG/3ML SOLN injection Inject 1.8 mg into the skin daily.     Marland Kitchen lisinopril (PRINIVIL,ZESTRIL) 40 MG tablet Take 1 tablet (40 mg total) by mouth daily. 90 tablet 3  . meclizine (ANTIVERT) 25 MG tablet Take by mouth as needed.     . metFORMIN (GLUCOPHAGE) 1000 MG tablet Take  1,000 mg by mouth 2 (two) times daily with a meal.    . metoprolol tartrate (LOPRESSOR) 25 MG tablet Take 1 tablet (25 mg total) by mouth 2 (two) times daily. 180 tablet 3  . montelukast (SINGULAIR) 10 MG tablet Take 1 tablet (10 mg total) by mouth at bedtime. (Patient taking differently: Take 10 mg by mouth as needed. ) 30 tablet 12  . Multiple Vitamins-Minerals (DAILY MULTI) TABS Take 1 tablet by mouth daily.     . nitroGLYCERIN (NITROSTAT) 0.4 MG SL tablet Place 1 tablet (0.4 mg total) under the tongue every 5 (five) minutes as needed. 25 tablet 6  . Omega-3 Fatty Acids (FISH OIL) 1000 MG CAPS Take 2 capsules by mouth 2 (two) times daily. Take 4    . ondansetron (ZOFRAN ODT) 4 MG disintegrating tablet Take 1 tablet (4 mg total) by mouth every 8 (eight) hours as needed for nausea or vomiting. 20 tablet 0  . RABEprazole (ACIPHEX) 20 MG tablet Take 20 mg by mouth daily.     . rosuvastatin (CRESTOR) 5 MG tablet Take 1 tablet (5 mg total) by mouth daily. 90 tablet 3  . sucralfate (CARAFATE) 1 g tablet Take 1 g by mouth 2 (two) times daily.     Marland Kitchen tiotropium (SPIRIVA) 18 MCG inhalation capsule Place 1 capsule (18 mcg total) into inhaler and inhale  daily. 90 capsule 4  . chlorhexidine (PERIDEX) 0.12 % solution Use as directed 15 mLs in the mouth or throat 2 (two) times daily. (Patient not taking: Reported on 11/28/2016) 120 mL 6   No current facility-administered medications for this visit.     Past Medical History:  Diagnosis Date  . Anemia   . Arthritis   . CAD (coronary artery disease)    a. cath 02/2006: BMS x 2 to RCA, cath o/w without significant coronary disease; b. nuclear stress test 07/2014: no signs of ischemia, no ekg changes concerning for ischemia, low risk study/normal study  . Chronic bronchitis ([place])    secondary to cigarette smoking  . COPD (chronic obstructive pulmonary disease) ([place])   . Diabetes mellitus   . FHx: allergies   . GERD (gastroesophageal reflux disease)   . Goiter    . Granulomatous disease ([place])   . Hernia   . Hyperlipidemia   . Hypertension   . Kidney stone on left side 2013  . Microalbuminuria   . Obesity   . Spinal stenosis   . Stroke [facility]) 10/29/2016    Past Surgical History:  Procedure Laterality Date  . ABDOMINAL HYSTERECTOMY    . BREAST BIOPSY    . COLONOSCOPY WITH PROPOFOL N/A 09/23/2014   Procedure: COLONOSCOPY WITH PROPOFOL;  Surgeon: Lollie Sails, MD;  Location: [facility] ENDOSCOPY;  Service: Endoscopy;  Laterality: N/A;  . CORONARY ANGIOPLASTY WITH STENT PLACEMENT  2008  . ESOPHAGOGASTRODUODENOSCOPY (EGD) WITH PROPOFOL N/A 12/30/2014   Procedure: ESOPHAGOGASTRODUODENOSCOPY (EGD) WITH PROPOFOL;  Surgeon: Lollie Sails, MD;  Location: [facility] ENDOSCOPY;  Service: Endoscopy;  Laterality: N/A;  . ESOPHAGOGASTRODUODENOSCOPY (EGD) WITH PROPOFOL N/A 07/19/2016   Procedure: ESOPHAGOGASTRODUODENOSCOPY (EGD) WITH PROPOFOL;  Surgeon: Lollie Sails, MD;  Location: [facility] ENDOSCOPY;  Service: Endoscopy;  Laterality: N/A;  . hysterectomy (other)      Social History Social History   Tobacco Use  . Smoking status: Current Every Day Smoker    Packs/day: 0.50    Years: 40.00    Pack years: 20.00    Types: Cigarettes  . Smokeless tobacco: Never Used  Substance Use Topics  . Alcohol use: Yes    Comment: 1/month- wine  . Drug use: No     Family History Family History  Problem Relation Age of Onset  . Heart failure Mother   . Stroke Mother   . Diabetes Mother   . Congestive Heart Failure Mother   . Lung cancer Father   . Breast cancer Maternal Aunt      Allergies  Allergen Reactions  . Sulfonamide Derivatives Other (See Comments)    Last taken as a child; made her mouth break out  . Statins Other (See Comments)    Caused elevated liver function studies  . Codeine Nausea And Vomiting  . Prednisone Palpitations and Other (See Comments)    Made her legs "feel weird."     REVIEW OF SYSTEMS (Negative unless  checked)  Constitutional: [] Weight loss  [] Fever  [] Chills Cardiac: [] Chest pain   [] Chest pressure   [] Palpitations   [] Shortness of breath when laying flat   [] Shortness of breath at rest   [x] Shortness of breath with exertion. Vascular:  [] Pain in legs with walking   [] Pain in legs at rest   [] Pain in legs when laying flat   [] Claudication   [] Pain in feet when walking  [] Pain in feet at rest  [] Pain in feet when laying flat   [] History of DVT   []   Phlebitis   [] Swelling in legs   [] Varicose veins   [] Non-healing ulcers Pulmonary:   [] Uses home oxygen   [] Productive cough   [] Hemoptysis   [] Wheeze  [] COPD   [] Asthma Neurologic:  [x] Dizziness  [] Blackouts   [] Seizures   [] History of stroke   [] History of TIA  [] Aphasia   [] Temporary blindness   [] Dysphagia   [] Weakness or numbness in arms   [] Weakness or numbness in legs Musculoskeletal:  [x] Arthritis   [] Joint swelling   [] Joint pain   [] Low back pain Hematologic:  [] Easy bruising  [] Easy bleeding   [] Hypercoagulable state   [] Anemic  [] Hepatitis Gastrointestinal:  [] Blood in stool   [] Vomiting blood  [] Gastroesophageal reflux/heartburn   [x] Abdominal pain Genitourinary:  [] Chronic kidney disease   [] Difficult urination  [] Frequent urination  [] Burning with urination   [] Hematuria Skin:  [] Rashes   [] Ulcers   [] Wounds Psychological:  [] History of anxiety   []  History of major depression.    Physical Examination  BP 119/72 (BP Location: Right Arm)   Pulse 77   Resp 16   Wt 73.4 kg (161 lb 12.8 oz)   BMI 26.92 kg/m  Gen:  WD/WN, NAD Head: [place]/AT, No temporalis wasting. Ear/Nose/Throat: Hearing grossly intact, nares w/o erythema or drainage, trachea midline Eyes: Conjunctiva clear. Sclera non-icteric Neck: Supple.  No JVD.  Pulmonary:  Good air movement, no use of accessory muscles.  Cardiac: RRR, normal S1, S2 Vascular:  Vessel Right Left  Radial Palpable Palpable                                   Gastrointestinal: soft,  non-tender/non-distended. No increased aortic impulse Musculoskeletal: M/S 5/5 throughout.  No deformity or atrophy.  Neurologic: Sensation grossly intact in extremities.  Symmetrical.  Speech is fluent.  Psychiatric: Judgment intact, Mood & affect appropriate for pt's clinical situation. Dermatologic: No rashes or ulcers noted.  No cellulitis or open wounds.       Labs Recent Results (from the past 2160 hour(s))  HM MAMMOGRAPHY     Status: None   Collection Time: 12/29/16 12:00 AM  Result Value Ref Range   HM Mammogram 0-4 Bi-Rad 0-4 Bi-Rad, Self Reported Normal    Comment: not done-error  HM DIABETES EYE EXAM     Status: None   Collection Time: 12/29/16 12:00 AM  Result Value Ref Range   HM Diabetic Eye Exam No Retinopathy No Retinopathy    Radiology No results found.    Assessment/Plan Chronic kidney disease, stage 3 Try to avoid contrast with evaluation as much as possible. Plan duplex  Hyperlipidemia lipid control important in reducing the progression of atherosclerotic disease. Continue statin therapy   Smoking We had a discussion for approximately 3-4 minutes regarding the absolute need for smoking cessation due to the deleterious nature of tobacco on the vascular system. Particularly in her case, it increase likelihood an aneurysmal growth. We discussed the tobacco use would diminish patency of any intervention, and likely significantly worsen progressio of disease. We discussed multiple agents for quitting including replacement therapy or medications to reduce cravings such as Chantix. The patient voices their understanding of the importance of smoking cessation.   Benign essential HTN blood pressure control important in reducing the progression of atherosclerotic disease and aneurysmal degeneration. On appropriate oral medications.   Stroke [facility]) Mild residual deficits  AAA (abdominal aortic aneurysm) without rupture (HCC) Her abdominal aortic aneurysm  is  stable today measuring 3.57 cm in maximal diameter in the proximal abdominal aorta. No surgery or intervention at this time. The patient has an asymptomatic abdominal aortic aneurysm that is less than 4 cm in maximal diameter.  I have discussed the natural history of abdominal aortic aneurysm and the small risk of rupture for aneurysm less than 5 cm in size.  However, as these small aneurysms tend to enlarge over time, continued surveillance with ultrasound or CT scan is mandatory.  I have also discussed optimizing medical management with hypertension and lipid control and the importance of abstinence from tobacco.  The patient is also encouraged to exercise a minimum of 30 minutes 4 times a week.  Should the patient develop new onset abdominal or back pain or signs of peripheral embolization they are instructed to seek medical attention immediately and to alert the physician providing care that they have an aneurysm.  The patient voices their understanding. The patient will return in 12 months with an aortic duplex.     Leotis Pain, MD  02/10/2017 9:25 AM    This note was created with Dragon medical transcription system.  Any errors from dictation are purely unintentional

## 2017-02-17 DIAGNOSIS — H2511 Age-related nuclear cataract, right eye: Secondary | ICD-10-CM | POA: Diagnosis not present

## 2017-02-22 ENCOUNTER — Other Ambulatory Visit: Payer: Self-pay

## 2017-02-22 ENCOUNTER — Encounter: Payer: Self-pay | Admitting: *Deleted

## 2017-02-22 NOTE — Discharge Instructions (Signed)

## 2017-03-01 ENCOUNTER — Encounter
Admission: RE | Disposition: A | Discharge status: Home / Self Care | Payer: Self-pay | Source: Ambulatory Visit | Attending: Ophthalmology

## 2017-03-01 ENCOUNTER — Ambulatory Visit: Payer: Medicare Other | Admitting: Anesthesiology

## 2017-03-01 ENCOUNTER — Ambulatory Visit
Admission: RE | Admit: 2017-03-01 | Discharge: 2017-03-01 | Disposition: A | Discharge status: Home / Self Care | Payer: Medicare Other | Source: Ambulatory Visit | Attending: Ophthalmology | Admitting: Ophthalmology

## 2017-03-01 DIAGNOSIS — I10 Essential (primary) hypertension: Secondary | ICD-10-CM | POA: Insufficient documentation

## 2017-03-01 DIAGNOSIS — J449 Chronic obstructive pulmonary disease, unspecified: Secondary | ICD-10-CM | POA: Insufficient documentation

## 2017-03-01 DIAGNOSIS — F17210 Nicotine dependence, cigarettes, uncomplicated: Secondary | ICD-10-CM | POA: Insufficient documentation

## 2017-03-01 DIAGNOSIS — I2510 Atherosclerotic heart disease of native coronary artery without angina pectoris: Secondary | ICD-10-CM | POA: Insufficient documentation

## 2017-03-01 DIAGNOSIS — E119 Type 2 diabetes mellitus without complications: Secondary | ICD-10-CM | POA: Insufficient documentation

## 2017-03-01 DIAGNOSIS — H2511 Age-related nuclear cataract, right eye: Secondary | ICD-10-CM | POA: Insufficient documentation

## 2017-03-01 HISTORY — DX: Chronic kidney disease, stage 3 (moderate): N18.3

## 2017-03-01 HISTORY — DX: Chronic kidney disease, stage 3 unspecified: N18.30

## 2017-03-01 HISTORY — PX: CATARACT EXTRACTION W/PHACO: SHX586

## 2017-03-01 HISTORY — DX: Simple chronic bronchitis: J41.0

## 2017-03-01 LAB — GLUCOSE, CAPILLARY
Glucose-Capillary: 117 mg/dL — ABNORMAL HIGH (ref 65–99)
Glucose-Capillary: 126 mg/dL — ABNORMAL HIGH (ref 65–99)

## 2017-03-01 SURGERY — PHACOEMULSIFICATION, CATARACT, WITH IOL INSERTION
Anesthesia: Monitor Anesthesia Care | Site: Eye | Laterality: Right | Wound class: Clean

## 2017-03-01 MED ORDER — CEFUROXIME OPHTHALMIC INJECTION 1 MG/0.1 ML
INJECTION | OPHTHALMIC | Status: DC | PRN
  Administered 2017-03-01: 0.1 mL via INTRACAMERAL

## 2017-03-01 MED ORDER — NA HYALUR & NA CHOND-NA HYALUR 0.4-0.35 ML IO KIT
PACK | INTRAOCULAR | Status: DC | PRN
  Administered 2017-03-01: 1 mL via INTRAOCULAR

## 2017-03-01 MED ORDER — BRIMONIDINE TARTRATE-TIMOLOL 0.2-0.5 % OP SOLN
OPHTHALMIC | Status: DC | PRN
  Administered 2017-03-01: 1 [drp] via OPHTHALMIC

## 2017-03-01 MED ORDER — MIDAZOLAM HCL 2 MG/2ML IJ SOLN
INTRAMUSCULAR | Status: DC | PRN
  Administered 2017-03-01 (×2): 1 mg via INTRAVENOUS

## 2017-03-01 MED ORDER — MOXIFLOXACIN HCL 0.5 % OP SOLN
1.0000 [drp] | OPHTHALMIC | Status: DC | PRN
  Administered 2017-03-01 (×3): 1 [drp] via OPHTHALMIC

## 2017-03-01 MED ORDER — ARMC OPHTHALMIC DILATING DROPS
1.0000 "application " | OPHTHALMIC | Status: DC | PRN
  Administered 2017-03-01 (×3): 1 via OPHTHALMIC

## 2017-03-01 MED ORDER — EPINEPHRINE PF 1 MG/ML IJ SOLN
INTRAOCULAR | Status: DC | PRN
  Administered 2017-03-01: 63 mL via OPHTHALMIC

## 2017-03-01 MED ORDER — LIDOCAINE HCL (PF) 2 % IJ SOLN
INTRAMUSCULAR | Status: DC | PRN
  Administered 2017-03-01: 1 mL

## 2017-03-01 MED ORDER — FENTANYL CITRATE (PF) 100 MCG/2ML IJ SOLN
INTRAMUSCULAR | Status: DC | PRN
  Administered 2017-03-01: 50 ug via INTRAVENOUS

## 2017-03-01 SURGICAL SUPPLY — 25 items
CANNULA ANT/CHMB 27GA (MISCELLANEOUS) ×2 IMPLANT
CARTRIDGE ABBOTT (MISCELLANEOUS) IMPLANT
GLOVE SURG LX 7.5 STRW (GLOVE) ×2
GLOVE SURG LX STRL 7.5 STRW (GLOVE) ×2 IMPLANT
GLOVE SURG TRIUMPH 8.0 PF LTX (GLOVE) ×2 IMPLANT
GOWN STRL REUS W/ TWL LRG LVL3 (GOWN DISPOSABLE) ×2 IMPLANT
GOWN STRL REUS W/TWL LRG LVL3 (GOWN DISPOSABLE) ×2
LENS IOL TECNIS ITEC 23.0 (Intraocular Lens) ×2 IMPLANT
MARKER SKIN DUAL TIP RULER LAB (MISCELLANEOUS) ×2 IMPLANT
NDL RETROBULBAR .5 NSTRL (NEEDLE) IMPLANT
NEEDLE FILTER BLUNT 18X 1/2SAF (NEEDLE) ×1
NEEDLE FILTER BLUNT 18X1 1/2 (NEEDLE) ×1 IMPLANT
PACK CATARACT BRASINGTON (MISCELLANEOUS) ×2 IMPLANT
PACK EYE AFTER SURG (MISCELLANEOUS) ×2 IMPLANT
PACK OPTHALMIC (MISCELLANEOUS) ×2 IMPLANT
RING MALYGIN 7.0 (MISCELLANEOUS) IMPLANT
SUT ETHILON 10-0 CS-B-6CS-B-6 (SUTURE)
SUT VICRYL  9 0 (SUTURE)
SUT VICRYL 9 0 (SUTURE) IMPLANT
SUTURE EHLN 10-0 CS-B-6CS-B-6 (SUTURE) IMPLANT
SYR 3ML LL SCALE MARK (SYRINGE) ×2 IMPLANT
SYR 5ML LL (SYRINGE) ×2 IMPLANT
SYR TB 1ML LUER SLIP (SYRINGE) ×2 IMPLANT
WATER STERILE IRR 250ML POUR (IV SOLUTION) ×2 IMPLANT
WIPE NON LINTING 3.25X3.25 (MISCELLANEOUS) ×2 IMPLANT

## 2017-03-01 NOTE — Anesthesia Postprocedure Evaluation (Signed)
Anesthesia Post Note  Patient: Hannah Kirby  Procedure(s) Performed: CATARACT EXTRACTION PHACO AND INTRAOCULAR LENS PLACEMENT (IOC) RIGHT DIABETIC (Right Eye)  Patient location during evaluation: PACU Anesthesia Type: MAC Level of consciousness: awake and alert Pain management: pain level controlled Vital Signs Assessment: post-procedure vital signs reviewed and stable Respiratory status: spontaneous breathing, nonlabored ventilation, respiratory function stable and patient connected to nasal cannula oxygen Cardiovascular status: stable and blood pressure returned to baseline Postop Assessment: no apparent nausea or vomiting Anesthetic complications: no    Mitra Duling ELAINE

## 2017-03-01 NOTE — Transfer of Care (Signed)
Immediate Anesthesia Transfer of Care Note  Patient: Hannah Kirby  Procedure(s) Performed: CATARACT EXTRACTION PHACO AND INTRAOCULAR LENS PLACEMENT (IOC) RIGHT DIABETIC (Right Eye)  Patient Location: PACU  Anesthesia Type: MAC  Level of Consciousness: awake, alert  and patient cooperative  Airway and Oxygen Therapy: Patient Spontanous Breathing and Patient connected to supplemental oxygen  Post-op Assessment: Post-op Vital signs reviewed, Patient's Cardiovascular Status Stable, Respiratory Function Stable, Patent Airway and No signs of Nausea or vomiting  Post-op Vital Signs: Reviewed and stable  Complications: No apparent anesthesia complications

## 2017-03-01 NOTE — H&P (Signed)
The History and Physical notes are on paper, have been signed, and are to be scanned. The patient remains stable and unchanged from the H&P.   Previous H&P reviewed, patient examined, and there are no changes.  Son Barkan 03/01/2017 7:21 AM

## 2017-03-01 NOTE — Anesthesia Preprocedure Evaluation (Signed)
Anesthesia Evaluation  Patient identified by MRN, date of birth, ID band Patient awake    Reviewed: Allergy & Precautions, NPO status , Patient's Chart, lab work & pertinent test results  Airway Mallampati: III       Dental  (+) Teeth Intact   Pulmonary COPD,  COPD inhaler, Current Smoker,     + decreased breath sounds      Cardiovascular Exercise Tolerance: Good hypertension, Pt. on medications and Pt. on home beta blockers + CAD   Rhythm:Regular     Neuro/Psych    GI/Hepatic GERD  Medicated,  Endo/Other  diabetes  Renal/GU CRFRenal disease     Musculoskeletal   Abdominal Normal abdominal exam  (+)   Peds  Hematology  (+) anemia ,   Anesthesia Other Findings   Reproductive/Obstetrics                             Anesthesia Physical  Anesthesia Plan  ASA: III  Anesthesia Plan: MAC   Post-op Pain Management:    Induction: Intravenous  PONV Risk Score and Plan:   Airway Management Planned: Natural Airway and Nasal Cannula  Additional Equipment:   Intra-op Plan:   Post-operative Plan:   Informed Consent: I have reviewed the patients History and Physical, chart, labs and discussed the procedure including the risks, benefits and alternatives for the proposed anesthesia with the patient or authorized representative who has indicated his/her understanding and acceptance.   Dental advisory given  Plan Discussed with: Surgeon  Anesthesia Plan Comments:         Anesthesia Quick Evaluation

## 2017-03-01 NOTE — Op Note (Signed)
LOCATION:  [facility]   PREOPERATIVE DIAGNOSIS:    Nuclear sclerotic cataract right eye. H25.11   POSTOPERATIVE DIAGNOSIS:  Nuclear sclerotic cataract right eye.     PROCEDURE:  Phacoemusification with posterior chamber intraocular lens placement of the right eye   LENS:   Implant Name Type Inv. Item Serial No. Manufacturer Lot No. LRB No. Used  LENS IOL DIOP 23.0 - [id number] Intraocular Lens LENS IOL DIOP 23.0 [id number]  Right 1        ULTRASOUND TIME: 17 % of 1 minutes, 20 seconds.  CDE 13.6   SURGEON:  Wyonia Hough, MD   ANESTHESIA:  Topical with tetracaine drops and 2% Xylocaine jelly, augmented with 1% preservative-free intracameral lidocaine.    COMPLICATIONS:  None.   DESCRIPTION OF PROCEDURE:  The patient was identified in the holding room and transported to the operating room and placed in the supine position under the operating microscope.  The right eye was identified as the operative eye and it was prepped and draped in the usual sterile ophthalmic fashion.   A 1 millimeter clear-corneal paracentesis was made at the 12:00 position.  0.5 ml of preservative-free 1% lidocaine was injected into the anterior chamber. The anterior chamber was filled with Viscoat viscoelastic.  A 2.4 millimeter keratome was used to make a near-clear corneal incision at the 9:00 position.  A curvilinear capsulorrhexis was made with a cystotome and capsulorrhexis forceps.  Balanced salt solution was used to hydrodissect and hydrodelineate the nucleus.   Phacoemulsification was then used in stop and chop fashion to remove the lens nucleus and epinucleus.  The remaining cortex was then removed using the irrigation and aspiration handpiece. Provisc was then placed into the capsular bag to distend it for lens placement.  A lens was then injected into the capsular bag.  The remaining viscoelastic was aspirated.   Wounds were hydrated with balanced salt solution.  The anterior  chamber was inflated to a physiologic pressure with balanced salt solution.  No wound leaks were noted. Cefuroxime 0.1 ml of a 10mg /ml solution was injected into the anterior chamber for a dose of 1 mg of intracameral antibiotic at the completion of the case.   Timolol and Brimonidine drops were applied to the eye.  The patient was taken to the recovery room in stable condition without complications of anesthesia or surgery.   Adryel Wortmann 03/01/2017, 8:13 AM

## 2017-03-01 NOTE — Anesthesia Procedure Notes (Signed)
Procedure Name: MAC Date/Time: 03/01/2017 7:54 AM Performed by: Janna Arch, CRNA Pre-anesthesia Checklist: Patient identified, Emergency Drugs available, Suction available and Patient being monitored Patient Re-evaluated:Patient Re-evaluated prior to induction Oxygen Delivery Method: Nasal cannula

## 2017-03-09 DIAGNOSIS — H2512 Age-related nuclear cataract, left eye: Secondary | ICD-10-CM | POA: Diagnosis not present

## 2017-03-10 ENCOUNTER — Encounter: Payer: Self-pay | Admitting: Family Medicine

## 2017-03-10 ENCOUNTER — Ambulatory Visit (INDEPENDENT_AMBULATORY_CARE_PROVIDER_SITE_OTHER): Payer: Medicare Other | Admitting: Family Medicine

## 2017-03-10 VITALS — BP 142/92 | HR 70 | Temp 98.6°F | Resp 18 | Wt 166.0 lb

## 2017-03-10 DIAGNOSIS — J449 Chronic obstructive pulmonary disease, unspecified: Secondary | ICD-10-CM | POA: Diagnosis not present

## 2017-03-10 DIAGNOSIS — I679 Cerebrovascular disease, unspecified: Secondary | ICD-10-CM

## 2017-03-10 DIAGNOSIS — F17200 Nicotine dependence, unspecified, uncomplicated: Secondary | ICD-10-CM | POA: Diagnosis not present

## 2017-03-10 DIAGNOSIS — M6281 Muscle weakness (generalized): Secondary | ICD-10-CM

## 2017-03-10 DIAGNOSIS — IMO0001 Reserved for inherently not codable concepts without codable children: Secondary | ICD-10-CM

## 2017-03-10 DIAGNOSIS — M48061 Spinal stenosis, lumbar region without neurogenic claudication: Secondary | ICD-10-CM

## 2017-03-10 DIAGNOSIS — I10 Essential (primary) hypertension: Secondary | ICD-10-CM | POA: Diagnosis not present

## 2017-03-10 NOTE — Progress Notes (Signed)
Patient: Hannah Kirby Female    DOB: 01/12/1946   72 y.o.   MRN: [id number] Visit Date: 03/10/2017  Today's Provider: Lelon Huh, MD   Chief Complaint  Patient presents with  . Hypertension  . COPD  . cerebrovascular disease  . Spinal Stenosis   Subjective:    HPI  Hypertension, follow-up:  BP Readings from Last 3 Encounters:  03/01/17 (!) 155/90  02/10/17 119/72  11/08/16 130/72    She was last seen for hypertension 4 months ago.  BP at that visit was 130/72. Management since that visit includes no changes. She reports good compliance with treatment. She is not having side effects.  She is not exercising. She is adherent to low salt diet.   Outside blood pressures are not being checked. She is experiencing none.  Patient denies chest pain, chest pressure/discomfort, claudication, dyspnea, exertional chest pressure/discomfort, fatigue, irregular heart beat, lower extremity edema, near-syncope, orthopnea, palpitations, paroxysmal nocturnal dyspnea, syncope and tachypnea.   Cardiovascular risk factors include advanced age (older than 26 for men, 28 for women), hypertension, sedentary lifestyle and smoking/ tobacco exposure.  Use of agents associated with hypertension: NSAIDS.     Weight trend: fluctuating a bit Wt Readings from Last 3 Encounters:  03/01/17 162 lb (73.5 kg)  02/10/17 161 lb 12.8 oz (73.4 kg)  11/08/16 157 lb (71.2 kg)   States she took a sinus pill this morning, but BP at home has been good.  Current diet: well balanced   COPD- from 11/08/16. No changes. Continue current medications. Patient reports good compliance with treatment, good tolerance and fair symptom control.   Cerebrovascular disease- from 11/08/16. Strongly encouraged to complete cessation of tobacco products. F/U with Dr. Starleen Blue. She has finished physical therapy. Is still weaker than baseline in upper extremity. Is ambulating well without restirction.   Spinal stenosis of  lumbar region- from 11/08/16. No changes. Patient reports this problem is unchanged. She reports good compliance with treatment, good tolerance and fair symptom control.      Allergies  Allergen Reactions  . Sulfonamide Derivatives Other (See Comments)    Last taken as a child; made her mouth break out  . Statins Other (See Comments)    Caused elevated liver function studies (02/22/17-pt taking rosuvastatin without problems)  . Codeine Nausea And Vomiting  . Prednisone Palpitations and Other (See Comments)    Made her legs "feel weird."     Current Outpatient Medications:  .  albuterol (VENTOLIN HFA) 108 (90 Base) MCG/ACT inhaler, Inhale 2 puffs into the lungs every 4 (four) hours as needed for wheezing or shortness of breath., Disp: 48 g, Rfl: 3 .  amLODipine (NORVASC) 5 MG tablet, Take 1 tablet (5 mg total) by mouth daily., Disp: 90 tablet, Rfl: 3 .  aspirin (ASPIR-81) 81 MG EC tablet, Take 81 mg by mouth daily.  , Disp: , Rfl:  .  chlorhexidine (PERIDEX) 0.12 % solution, Use as directed 15 mLs in the mouth or throat 2 (two) times daily. (Patient not taking: Reported on 11/28/2016), Disp: 120 mL, Rfl: 6 .  clopidogrel (PLAVIX) 75 MG tablet, Take 1 tablet (75 mg total) by mouth daily., Disp: 90 tablet, Rfl: 3 .  ezetimibe (ZETIA) 10 MG tablet, Take 1 tablet (10 mg total) by mouth daily., Disp: 90 tablet, Rfl: 3 .  fluticasone furoate-vilanterol (BREO ELLIPTA) 100-25 MCG/INH AEPB, Inhale 1 puff into the lungs daily., Disp: 84 each, Rfl: 3 .  HYDROcodone-acetaminophen (NORCO/VICODIN) 5-325  MG tablet, Take 1 tablet by mouth every 6 (six) hours as needed., Disp: 120 tablet, Rfl: 0 .  insulin glargine (LANTUS) 100 UNIT/ML injection, Inject 12 Units into the skin daily. , Disp: , Rfl:  .  Liraglutide (VICTOZA) 18 MG/3ML SOLN injection, Inject 1.8 mg into the skin daily. , Disp: , Rfl:  .  lisinopril (PRINIVIL,ZESTRIL) 40 MG tablet, Take 1 tablet (40 mg total) by mouth daily., Disp: 90 tablet, Rfl:  3 .  meclizine (ANTIVERT) 25 MG tablet, Take by mouth as needed. , Disp: , Rfl:  .  metFORMIN (GLUCOPHAGE) 1000 MG tablet, Take 1,000 mg by mouth 2 (two) times daily with a meal., Disp: , Rfl:  .  metoprolol tartrate (LOPRESSOR) 25 MG tablet, Take 1 tablet (25 mg total) by mouth 2 (two) times daily., Disp: 180 tablet, Rfl: 3 .  montelukast (SINGULAIR) 10 MG tablet, Take 1 tablet (10 mg total) by mouth at bedtime. (Patient taking differently: Take 10 mg by mouth as needed. ), Disp: 30 tablet, Rfl: 12 .  Multiple Vitamins-Minerals (DAILY MULTI) TABS, Take 1 tablet by mouth daily. , Disp: , Rfl:  .  nitroGLYCERIN (NITROSTAT) 0.4 MG SL tablet, Place 1 tablet (0.4 mg total) under the tongue every 5 (five) minutes as needed., Disp: 25 tablet, Rfl: 6 .  Omega-3 Fatty Acids (FISH OIL) 1000 MG CAPS, Take 2 capsules by mouth 2 (two) times daily. Take 4, Disp: , Rfl:  .  ondansetron (ZOFRAN ODT) 4 MG disintegrating tablet, Take 1 tablet (4 mg total) by mouth every 8 (eight) hours as needed for nausea or vomiting., Disp: 20 tablet, Rfl: 0 .  PHENYLEPHRINE-APAP-GUAIFENESIN PO, Take by mouth 2 (two) times daily as needed., Disp: , Rfl:  .  RABEprazole (ACIPHEX) 20 MG tablet, Take 20 mg by mouth daily. , Disp: , Rfl:  .  rosuvastatin (CRESTOR) 5 MG tablet, Take 1 tablet (5 mg total) by mouth daily., Disp: 90 tablet, Rfl: 3 .  sucralfate (CARAFATE) 1 g tablet, Take 1 g by mouth 2 (two) times daily. , Disp: , Rfl:  .  tiotropium (SPIRIVA) 18 MCG inhalation capsule, Place 1 capsule (18 mcg total) into inhaler and inhale daily., Disp: 90 capsule, Rfl: 4  Review of Systems  Constitutional: Negative for activity change, appetite change, chills, diaphoresis, fatigue and unexpected weight change.  HENT: Positive for postnasal drip.   Respiratory: Positive for cough (productive with clear sputum) and shortness of breath.   Cardiovascular: Negative for chest pain, palpitations and leg swelling.  Endocrine: Negative for  cold intolerance, heat intolerance, polydipsia, polyphagia and polyuria.  Musculoskeletal: Positive for back pain.  Neurological: Positive for headaches. Negative for dizziness, tremors, seizures, syncope, facial asymmetry, speech difficulty, weakness, light-headedness and numbness.    Social History   Tobacco Use  . Smoking status: Current Every Day Smoker    Packs/day: 0.75    Years: 40.00    Pack years: 30.00    Types: Cigarettes  . Smokeless tobacco: Never Used  Substance Use Topics  . Alcohol use: Yes    Comment: 1/month- wine   Objective:   BP (!) 142/92 (BP Location: Left Arm, Cuff Size: Large)   Pulse 70   Temp 98.6 F (37 C) (Oral)   Resp 18   Wt 166 lb (75.3 kg)   SpO2 93% Comment: room air  BMI 27.62 kg/m  Vitals:   03/10/17 1131 03/10/17 1133  BP: (!) 162/90 (!) 142/92  Pulse: 70   Resp: 18  Temp: 98.6 F (37 C)   TempSrc: Oral   SpO2: 93%   Weight: 166 lb (75.3 kg)      Physical Exam   Physical Exam   General Appearance:    Alert, cooperative, no distress  Eyes:    PERRL, conjunctiva/corneas clear, EOM's intact       Lungs:     Clear to auscultation bilaterally, respirations unlabored  Heart:    Regular rate and rhythm  Neurologic:   Awake, alert, oriented x 3. Muscles strength left grip, shoulder and elbow +4. Right arm +5. Both LE +5 muscle strength but very slightly weaker on left. No fascial droop or abnormal speech patterns.          Assessment & Plan:     1. Cerebrovascular disease Doing well, regained most of baseline strength.   2. Benign essential HTN Fairly well controlled. Continue current medications.    3. Smoking Stop smoking.   4. Muscle weakness of left upper extremity Secondary to CVA. Finished physical therapy. Much better.   5. Chronic obstructive pulmonary disease, unspecified COPD type ([place]) Doing well with current inhalers.   6. Spinal stenosis of lumbar region, unspecified whether neurogenic claudication  present Doing well current medications. Continue current medications.     Follow up 6 months.       Lelon Huh, MD  [facility]

## 2017-03-16 ENCOUNTER — Encounter: Payer: Self-pay | Admitting: Physician Assistant

## 2017-03-16 ENCOUNTER — Encounter: Payer: Self-pay | Admitting: *Deleted

## 2017-03-16 ENCOUNTER — Other Ambulatory Visit: Payer: Self-pay

## 2017-03-16 ENCOUNTER — Ambulatory Visit (INDEPENDENT_AMBULATORY_CARE_PROVIDER_SITE_OTHER): Payer: Medicare Other | Admitting: Physician Assistant

## 2017-03-16 VITALS — BP 148/88 | HR 72 | Temp 98.3°F | Resp 16 | Wt 166.0 lb

## 2017-03-16 DIAGNOSIS — E0821 Diabetes mellitus due to underlying condition with diabetic nephropathy: Secondary | ICD-10-CM

## 2017-03-16 DIAGNOSIS — J441 Chronic obstructive pulmonary disease with (acute) exacerbation: Secondary | ICD-10-CM

## 2017-03-16 MED ORDER — PREDNISONE 10 MG PO TABS: 10.0000 mg | ORAL_TABLET | Freq: Every day | ORAL | 0 refills | Status: AC

## 2017-03-16 MED ORDER — DOXYCYCLINE HYCLATE 100 MG PO TABS: 100.0000 mg | ORAL_TABLET | Freq: Two times a day (BID) | ORAL | 0 refills | Status: AC

## 2017-03-16 NOTE — Patient Instructions (Signed)
Chronic Obstructive Pulmonary Disease Exacerbation  Chronic obstructive pulmonary disease (COPD) is a common lung problem. In COPD, the flow of air from the lungs is limited. COPD exacerbations are times that breathing gets worse and you need extra treatment. Without treatment they can be life threatening. If they happen often, your lungs can become more damaged. If your COPD gets worse, your doctor may treat you with:  ? Medicines.  ? Oxygen.  ? Different ways to clear your airway, such as using a mask.    Follow these instructions at home:  ? Do not smoke.  ? Avoid tobacco smoke and other things that bother your lungs.  ? If given, take your antibiotic medicine as told. Finish the medicine even if you start to feel better.  ? Only take medicines as told by your doctor.  ? Drink enough fluids to keep your pee (urine) clear or pale yellow (unless your doctor has told you not to).  ? Use a cool mist machine (vaporizer).  ? If you use oxygen or a machine that turns liquid medicine into a mist (nebulizer), continue to use them as told.  ? Keep up with shots (vaccinations) as told by your doctor.  ? Exercise regularly.  ? Eat healthy foods.  ? Keep all doctor visits as told.  Get help right away if:  ? You are very short of breath and it gets worse.  ? You have trouble talking.  ? You have bad chest pain.  ? You have blood in your spit (sputum).  ? You have a fever.  ? You keep throwing up (vomiting).  ? You feel weak, or you pass out (faint).  ? You feel confused.  ? You keep getting worse.  This information is not intended to replace advice given to you by your health care provider. Make sure you discuss any questions you have with your health care provider.  Document Released: 01/27/2011 Document Revised: 07/16/2015 Document Reviewed: 10/12/2012  Elsevier Interactive Patient Education ? 2017 [employer]

## 2017-03-16 NOTE — Progress Notes (Signed)
[facility]  Chief Complaint  Patient presents with  . URI    Started about six days ago.     Subjective:    Patient ID: Hannah Kirby, female    DOB: 1945/10/24, 72 y.o.   MRN: [id number]  Upper Respiratory Infection: SOREN PIGMAN is a 72 y.o. 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 Symptoms include congestion and cough. Onset of symptoms was 6 days ago, gradually worsening since that time. She also c/o congestion, cough described as productive, lightheadedness, nasal congestion, post nasal drip, sinus pressure and wheezing for the past 6 days .  She is drinking plenty of fluids. Evaluation to date: none. Treatment to date: cough suppressants. The treatment has provided moderate relief. She is having cataract surgery next week. Her last A1c was 6.3% and her fasting blood sugars are ~ 115.   Review of Systems  Constitutional: Positive for fatigue. Negative for activity change, appetite change, chills, diaphoresis, fever and unexpected weight change.  HENT: Positive for congestion, postnasal drip, rhinorrhea, sinus pressure and sinus pain. Negative for ear discharge, ear pain, nosebleeds, sneezing and tinnitus.   Eyes: Negative.   Respiratory: Positive for cough and wheezing. Negative for apnea, choking, chest tightness, shortness of breath and stridor.   Gastrointestinal: Negative.   Musculoskeletal: Negative for neck pain and neck stiffness.  Neurological: Positive for light-headedness and headaches. Negative for dizziness.  Hematological: Negative for adenopathy.       Objective:   There were no vitals taken for this visit.  Patient Active Problem List   Diagnosis Date Noted  . Stroke ([place]) 11/08/2016  . Weakness of left upper extremity 10/30/2016  . AAA (abdominal aortic aneurysm) without rupture ([place]) 08/12/2016  . Barrett esophagus  07/21/2016  . Chronic kidney disease, stage 3 ([place]) 07/27/2015  . Chest pain at rest 05/06/2015  . 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[place]) 10/28/2014  . Solitary pulmonary nodule on lung CT 08/20/2014  . Allergic rhinitis 07/28/2014  . CAFL (chronic airflow limitation) ([place]) 07/28/2014  . Abnormal liver enzymes 07/28/2014  . Acid reflux 07/28/2014  . Head revolving around 07/28/2014  . CAD (coronary artery disease)   . Obesity   . Granuloma annulare   . Back pain 11/12/2013  . Scoliosis 10/30/2013  . Lumbar radiculopathy 10/30/2013  . Lumbar canal stenosis 10/30/2013  . Calcium blood increased 10/22/2013  . Microalbuminuria 10/22/2013  . Smoking 03/21/2011  . Obese 03/21/2011  . Edema 08/18/2010  . Hyperlipidemia 03/25/2009  . Benign essential HTN 03/25/2009    Outpatient Encounter Medications as of 03/16/2017  Medication Sig Note  . albuterol (VENTOLIN HFA) 108 (90 Base) MCG/ACT inhaler Inhale 2 puffs into the lungs every 4 (four) hours as needed for wheezing or shortness of breath.   Marland Kitchen amLODipine (NORVASC) 5 MG tablet Take 1 tablet (5 mg total) by mouth daily.   Marland Kitchen aspirin (ASPIR-81) 81 MG EC tablet Take 81 mg by mouth daily.     . chlorhexidine (PERIDEX) 0.12 % solution Use as directed 15 mLs in the mouth or throat 2 (two) times daily.   . clopidogrel (PLAVIX) 75 MG tablet Take 1 tablet (75 mg total) by mouth daily.   Marland Kitchen ezetimibe (ZETIA) 10 MG tablet Take 1 tablet (10 mg total) by mouth daily.   . fluticasone furoate-vilanterol (BREO ELLIPTA) 100-25 MCG/INH AEPB Inhale 1 puff into the lungs daily.   Marland Kitchen  HYDROcodone-acetaminophen (NORCO/VICODIN) 5-325 MG tablet Take 1 tablet by mouth every 6 (six) hours as needed.   . insulin glargine (LANTUS) 100 UNIT/ML injection Inject 12 Units into the skin daily.  03/01/2017: 1/2 dose   . Liraglutide (VICTOZA) 18 MG/3ML SOLN injection Inject 1.8 mg into the skin daily.    Marland Kitchen lisinopril (PRINIVIL,ZESTRIL) 40 MG tablet Take 1 tablet (40  mg total) by mouth daily.   . meclizine (ANTIVERT) 25 MG tablet Take by mouth as needed.  03/01/2017: PRN  . metFORMIN (GLUCOPHAGE) 1000 MG tablet Take 1,000 mg by mouth 2 (two) times daily with a meal.   . metoprolol tartrate (LOPRESSOR) 25 MG tablet Take 1 tablet (25 mg total) by mouth 2 (two) times daily.   . montelukast (SINGULAIR) 10 MG tablet Take 1 tablet (10 mg total) by mouth at bedtime. (Patient taking differently: Take 10 mg by mouth as needed. )   . Multiple Vitamins-Minerals (DAILY MULTI) TABS Take 1 tablet by mouth daily.    . nitroGLYCERIN (NITROSTAT) 0.4 MG SL tablet Place 1 tablet (0.4 mg total) under the tongue every 5 (five) minutes as needed. 03/01/2017: Has never had to use  . Omega-3 Fatty Acids (FISH OIL) 1000 MG CAPS Take 2 capsules by mouth 2 (two) times daily. Take 4   . ondansetron (ZOFRAN ODT) 4 MG disintegrating tablet Take 1 tablet (4 mg total) by mouth every 8 (eight) hours as needed for nausea or vomiting. 03/01/2017: PRN  . PHENYLEPHRINE-APAP-GUAIFENESIN PO Take by mouth 2 (two) times daily as needed. 03/01/2017: PRN  . RABEprazole (ACIPHEX) 20 MG tablet Take 20 mg by mouth daily.    . rosuvastatin (CRESTOR) 5 MG tablet Take 1 tablet (5 mg total) by mouth daily.   . sucralfate (CARAFATE) 1 g tablet Take 1 g by mouth 2 (two) times daily.    Marland Kitchen tiotropium (SPIRIVA) 18 MCG inhalation capsule Place 1 capsule (18 mcg total) into inhaler and inhale daily.    No facility-administered encounter medications on file as of 03/16/2017.     Allergies  Allergen Reactions  . Sulfonamide Derivatives Other (See Comments)    Last taken as a child; made her mouth break out  . Statins Other (See Comments)    Caused elevated liver function studies (02/22/17-pt taking rosuvastatin without problems)  . Codeine Nausea And Vomiting  . Prednisone Palpitations and Other (See Comments)    Made her legs "feel weird."       Physical Exam  Constitutional: She is oriented to person, place, and  time. She appears well-developed and well-nourished.  HENT:  Right Ear: External ear normal.  Left Ear: External ear normal.  Nose: Nose normal.  Mouth/Throat: Oropharynx is clear and moist. No oropharyngeal exudate.  Cardiovascular: Normal rate and regular rhythm.  Pulmonary/Chest: Effort normal. No respiratory distress. She has wheezes. She has no rales.  Wheezes and rhonchi diffusely.   Neurological: She is alert and oriented to person, place, and time.  Skin: Skin is warm and dry.  Psychiatric: She has a normal mood and affect. Her behavior is normal.       Assessment & Plan:  1. COPD exacerbation (HCC)  Does have history of COPD, wheezing and rhonchi appreciated on exam. Treat as below. Counseled this will increase her sugars slightly 2/2 prednisone but this should return to normal several days after completion.   - doxycycline (VIBRA-TABS) 100 MG tablet; Take 1 tablet (100 mg total) by mouth 2 (two) times daily for 7 days.  Dispense: 14 tablet; Refill: 0 - predniSONE (DELTASONE) 10 MG tablet; Take 1 tablet (10 mg total) by mouth daily with breakfast for 5 days.  Dispense: 5 tablet; Refill: 0  2. Diabetes mellitus due to underlying condition with diabetic nephropathy, unspecified whether long term insulin use ([place])  Return if symptoms worsen or fail to improve.   The entirety of the information documented in the History of Present Illness, Review of Systems and Physical Exam were personally obtained by me. Portions of this information were initially documented by Ashley Royalty, CMA and reviewed by me for thoroughness and accuracy.

## 2017-03-21 MED ORDER — MEPERIDINE HCL 25 MG/ML IJ SOLN: 6.2500 mg | INTRAMUSCULAR | Status: DC | PRN

## 2017-03-21 MED ORDER — LACTATED RINGERS IV SOLN: INTRAVENOUS | Status: DC

## 2017-03-21 MED ORDER — FENTANYL CITRATE (PF) 100 MCG/2ML IJ SOLN
25.0000 ug | INTRAMUSCULAR | Status: DC | PRN
  Administered 2017-03-22: 50 ug via INTRAVENOUS

## 2017-03-21 MED ORDER — PROMETHAZINE HCL 25 MG/ML IJ SOLN: 6.2500 mg | INTRAMUSCULAR | Status: DC | PRN

## 2017-03-21 NOTE — Discharge Instructions (Signed)

## 2017-03-22 ENCOUNTER — Ambulatory Visit: Payer: Medicare Other | Admitting: Anesthesiology

## 2017-03-22 ENCOUNTER — Ambulatory Visit
Admission: RE | Admit: 2017-03-22 | Discharge: 2017-03-22 | Disposition: A | Discharge status: Home / Self Care | Payer: Medicare Other | Source: Ambulatory Visit | Attending: Ophthalmology | Admitting: Ophthalmology

## 2017-03-22 ENCOUNTER — Encounter
Admission: RE | Disposition: A | Discharge status: Home / Self Care | Payer: Self-pay | Source: Ambulatory Visit | Attending: Ophthalmology

## 2017-03-22 DIAGNOSIS — Z79899 Other long term (current) drug therapy: Secondary | ICD-10-CM | POA: Insufficient documentation

## 2017-03-22 DIAGNOSIS — I10 Essential (primary) hypertension: Secondary | ICD-10-CM | POA: Insufficient documentation

## 2017-03-22 DIAGNOSIS — F17200 Nicotine dependence, unspecified, uncomplicated: Secondary | ICD-10-CM | POA: Insufficient documentation

## 2017-03-22 DIAGNOSIS — E1136 Type 2 diabetes mellitus with diabetic cataract: Secondary | ICD-10-CM | POA: Diagnosis not present

## 2017-03-22 DIAGNOSIS — J449 Chronic obstructive pulmonary disease, unspecified: Secondary | ICD-10-CM | POA: Insufficient documentation

## 2017-03-22 DIAGNOSIS — I2510 Atherosclerotic heart disease of native coronary artery without angina pectoris: Secondary | ICD-10-CM | POA: Insufficient documentation

## 2017-03-22 DIAGNOSIS — K219 Gastro-esophageal reflux disease without esophagitis: Secondary | ICD-10-CM | POA: Insufficient documentation

## 2017-03-22 DIAGNOSIS — D649 Anemia, unspecified: Secondary | ICD-10-CM | POA: Diagnosis not present

## 2017-03-22 DIAGNOSIS — H2512 Age-related nuclear cataract, left eye: Secondary | ICD-10-CM | POA: Diagnosis not present

## 2017-03-22 HISTORY — PX: CATARACT EXTRACTION W/PHACO: SHX586

## 2017-03-22 LAB — GLUCOSE, CAPILLARY
GLUCOSE-CAPILLARY: 101 mg/dL — AB (ref 65–99)
GLUCOSE-CAPILLARY: 100 mg/dL — AB (ref 65–99)

## 2017-03-22 SURGERY — PHACOEMULSIFICATION, CATARACT, WITH IOL INSERTION
Anesthesia: Topical | Laterality: Left | Wound class: Clean

## 2017-03-22 MED ORDER — MIDAZOLAM HCL 2 MG/2ML IJ SOLN
INTRAMUSCULAR | Status: DC | PRN
  Administered 2017-03-22: 2 mg via INTRAVENOUS

## 2017-03-22 MED ORDER — EPINEPHRINE PF 1 MG/ML IJ SOLN
INTRAMUSCULAR | Status: DC | PRN
  Administered 2017-03-22: 61 mL via OPHTHALMIC

## 2017-03-22 MED ORDER — CEFUROXIME OPHTHALMIC INJECTION 1 MG/0.1 ML
INJECTION | OPHTHALMIC | Status: DC | PRN
  Administered 2017-03-22: 0.1 mL via INTRACAMERAL

## 2017-03-22 MED ORDER — MOXIFLOXACIN HCL 0.5 % OP SOLN
1.0000 [drp] | OPHTHALMIC | Status: DC | PRN
  Administered 2017-03-22 (×3): 1 [drp] via OPHTHALMIC

## 2017-03-22 MED ORDER — LIDOCAINE HCL (PF) 2 % IJ SOLN
INTRAOCULAR | Status: DC | PRN
  Administered 2017-03-22: 1 mL via INTRAMUSCULAR

## 2017-03-22 MED ORDER — BRIMONIDINE TARTRATE-TIMOLOL 0.2-0.5 % OP SOLN
OPHTHALMIC | Status: DC | PRN
  Administered 2017-03-22: 1 [drp] via OPHTHALMIC

## 2017-03-22 MED ORDER — ARMC OPHTHALMIC DILATING DROPS
1.0000 "application " | OPHTHALMIC | Status: DC | PRN
  Administered 2017-03-22 (×3): 1 via OPHTHALMIC

## 2017-03-22 MED ORDER — NA HYALUR & NA CHOND-NA HYALUR 0.4-0.35 ML IO KIT
PACK | INTRAOCULAR | Status: DC | PRN
  Administered 2017-03-22: 1 mL via INTRAOCULAR

## 2017-03-22 SURGICAL SUPPLY — 25 items

## 2017-03-22 NOTE — Anesthesia Preprocedure Evaluation (Addendum)
Anesthesia Evaluation  Patient identified by MRN, date of birth, ID band Patient awake    Reviewed: Allergy & Precautions, NPO status , Patient's Chart, lab work & pertinent test results  Airway Mallampati: III       Dental  (+) Teeth Intact   Pulmonary COPD,  COPD inhaler, Current Smoker,     + decreased breath sounds      Cardiovascular Exercise Tolerance: Good hypertension, Pt. on medications and Pt. on home beta blockers + CAD   Rhythm:Regular     Neuro/Psych    GI/Hepatic GERD  Medicated,  Endo/Other  diabetes  Renal/GU CRFRenal disease     Musculoskeletal   Abdominal Normal abdominal exam  (+)   Peds  Hematology  (+) anemia ,   Anesthesia Other Findings   Reproductive/Obstetrics                             Anesthesia Physical  Anesthesia Plan  ASA: III  Anesthesia Plan: MAC   Post-op Pain Management:    Induction: Intravenous  PONV Risk Score and Plan:   Airway Management Planned: Natural Airway and Nasal Cannula  Additional Equipment:   Intra-op Plan:   Post-operative Plan:   Informed Consent: I have reviewed the patients History and Physical, chart, labs and discussed the procedure including the risks, benefits and alternatives for the proposed anesthesia with the patient or authorized representative who has indicated his/her understanding and acceptance.   Dental advisory given  Plan Discussed with: Surgeon  Anesthesia Plan Comments:         Anesthesia Quick Evaluation

## 2017-03-22 NOTE — Anesthesia Postprocedure Evaluation (Signed)
Anesthesia Post Note  Patient: Hannah Kirby  Procedure(s) Performed: CATARACT EXTRACTION PHACO AND INTRAOCULAR LENS PLACEMENT (IOC) LEFT DIABETIC (Left )  Patient location during evaluation: PACU Anesthesia Type: MAC Level of consciousness: awake and alert Pain management: pain level controlled Vital Signs Assessment: post-procedure vital signs reviewed and stable Respiratory status: spontaneous breathing, nonlabored ventilation, respiratory function stable and patient connected to nasal cannula oxygen Cardiovascular status: stable and blood pressure returned to baseline Postop Assessment: no apparent nausea or vomiting Anesthetic complications: no    SCOURAS, NICOLE ELAINE

## 2017-03-22 NOTE — Transfer of Care (Signed)
Immediate Anesthesia Transfer of Care Note  Patient: Hannah Kirby  Procedure(s) Performed: CATARACT EXTRACTION PHACO AND INTRAOCULAR LENS PLACEMENT (IOC) LEFT DIABETIC (Left )  Patient Location: PACU  Anesthesia Type: No value filed.  Level of Consciousness: awake, alert  and patient cooperative  Airway and Oxygen Therapy: Patient Spontanous Breathing and Patient connected to supplemental oxygen  Post-op Assessment: Post-op Vital signs reviewed, Patient's Cardiovascular Status Stable, Respiratory Function Stable, Patent Airway and No signs of Nausea or vomiting  Post-op Vital Signs: Reviewed and stable  Complications: No apparent anesthesia complications

## 2017-03-22 NOTE — Anesthesia Procedure Notes (Signed)
Procedure Name: MAC Date/Time: 03/22/2017 8:41 AM Performed by: Janna Arch, CRNA Pre-anesthesia Checklist: Patient identified, Emergency Drugs available, Suction available and Patient being monitored Patient Re-evaluated:Patient Re-evaluated prior to induction Oxygen Delivery Method: Nasal cannula

## 2017-03-22 NOTE — H&P (Signed)
The History and Physical notes are on paper, have been signed, and are to be scanned. The patient remains stable and unchanged from the H&P.   Previous H&P reviewed, patient examined, and there are no changes.  Hannah Kirby 03/22/2017 8:12 AM

## 2017-03-22 NOTE — Op Note (Signed)
OPERATIVE NOTE  Hannah Kirby [id number] 03/22/2017   PREOPERATIVE DIAGNOSIS:  Nuclear sclerotic cataract left eye. H25.12   POSTOPERATIVE DIAGNOSIS:    Nuclear sclerotic cataract left eye.     PROCEDURE:  Phacoemusification with posterior chamber intraocular lens placement of the left eye   LENS:   Implant Name Type Inv. Item Serial No. Manufacturer Lot No. LRB No. Used  LENS IOL DIOP 22.5 - [id number] Intraocular Lens LENS IOL DIOP 22.5 [id number]  Left 1        ULTRASOUND TIME: 17  % of 0 minutes 52 seconds, CDE 8.6  SURGEON:  Wyonia Hough, MD   ANESTHESIA:  Topical with tetracaine drops and 2% Xylocaine jelly, augmented with 1% preservative-free intracameral lidocaine.    COMPLICATIONS:  None.   DESCRIPTION OF PROCEDURE:  The patient was identified in the holding room and transported to the operating room and placed in the supine position under the operating microscope.  The left eye was identified as the operative eye and it was prepped and draped in the usual sterile ophthalmic fashion.   A 1 millimeter clear-corneal paracentesis was made at the 1:30 position.  0.5 ml of preservative-free 1% lidocaine was injected into the anterior chamber.  The anterior chamber was filled with Viscoat viscoelastic.  A 2.4 millimeter keratome was used to make a near-clear corneal incision at the 10:30 position.  .  A curvilinear capsulorrhexis was made with a cystotome and capsulorrhexis forceps.  Balanced salt solution was used to hydrodissect and hydrodelineate the nucleus.   Phacoemulsification was then used in stop and chop fashion to remove the lens nucleus and epinucleus.  The remaining cortex was then removed using the irrigation and aspiration handpiece. Provisc was then placed into the capsular bag to distend it for lens placement.  A lens was then injected into the capsular bag.  The remaining viscoelastic was aspirated.   Wounds were hydrated with balanced salt  solution.  The anterior chamber was inflated to a physiologic pressure with balanced salt solution.  No wound leaks were noted. Cefuroxime 0.1 ml of a 10mg /ml solution was injected into the anterior chamber for a dose of 1 mg of intracameral antibiotic at the completion of the case.   Timolol and Brimonidine drops were applied to the eye.  The patient was taken to the recovery room in stable condition without complications of anesthesia or surgery.  [name] 03/22/2017, 9:00 AM

## 2017-03-22 NOTE — Anesthesia Preprocedure Evaluation (Deleted)
Anesthesia Evaluation    Airway        Dental   Pulmonary Current Smoker,           Cardiovascular hypertension,      Neuro/Psych    GI/Hepatic   Endo/Other  diabetes  Renal/GU      Musculoskeletal   Abdominal   Peds  Hematology   Anesthesia Other Findings   Reproductive/Obstetrics                                                              Anesthesia Evaluation  Patient identified by MRN, date of birth, ID band Patient awake    Reviewed: Allergy & Precautions, NPO status , Patient's Chart, lab work & pertinent test results  Airway Mallampati: III       Dental  (+) Teeth Intact   Pulmonary COPD,  COPD inhaler, Current Smoker,     + decreased breath sounds      Cardiovascular Exercise Tolerance: Good hypertension, Pt. on medications and Pt. on home beta blockers + CAD   Rhythm:Regular     Neuro/Psych    GI/Hepatic GERD  Medicated,  Endo/Other  diabetes  Renal/GU CRFRenal disease     Musculoskeletal   Abdominal Normal abdominal exam  (+)   Peds  Hematology  (+) anemia ,   Anesthesia Other Findings   Reproductive/Obstetrics                             Anesthesia Physical  Anesthesia Plan  ASA: III  Anesthesia Plan: MAC   Post-op Pain Management:    Induction: Intravenous  PONV Risk Score and Plan:   Airway Management Planned: Natural Airway and Nasal Cannula  Additional Equipment:   Intra-op Plan:   Post-operative Plan:   Informed Consent: I have reviewed the patients History and Physical, chart, labs and discussed the procedure including the risks, benefits and alternatives for the proposed anesthesia with the patient or authorized representative who has indicated his/her understanding and acceptance.   Dental advisory given  Plan Discussed with: Surgeon  Anesthesia Plan Comments:         Anesthesia  Quick Evaluation  Anesthesia Physical Anesthesia Plan Anesthesia Quick Evaluation

## 2017-03-28 DIAGNOSIS — Z720 Tobacco use: Secondary | ICD-10-CM | POA: Diagnosis not present

## 2017-03-28 DIAGNOSIS — E785 Hyperlipidemia, unspecified: Secondary | ICD-10-CM | POA: Diagnosis not present

## 2017-03-28 DIAGNOSIS — E1121 Type 2 diabetes mellitus with diabetic nephropathy: Secondary | ICD-10-CM | POA: Diagnosis not present

## 2017-03-28 DIAGNOSIS — Z794 Long term (current) use of insulin: Secondary | ICD-10-CM | POA: Diagnosis not present

## 2017-04-02 ENCOUNTER — Encounter: Payer: Self-pay | Admitting: Family Medicine

## 2017-04-03 MED ORDER — AMOXICILLIN-POT CLAVULANATE 875-125 MG PO TABS: 1.0000 | ORAL_TABLET | Freq: Two times a day (BID) | ORAL | 0 refills | Status: AC

## 2017-04-14 ENCOUNTER — Other Ambulatory Visit: Payer: Self-pay | Admitting: *Deleted

## 2017-04-14 ENCOUNTER — Encounter: Payer: Self-pay | Admitting: Family Medicine

## 2017-04-14 DIAGNOSIS — M48061 Spinal stenosis, lumbar region without neurogenic claudication: Secondary | ICD-10-CM

## 2017-04-14 MED ORDER — HYDROCODONE-ACETAMINOPHEN 5-325 MG PO TABS: 1.0000 | ORAL_TABLET | Freq: Four times a day (QID) | ORAL | 0 refills | Status: DC | PRN

## 2017-05-12 NOTE — Progress Notes (Signed)
Patient at 2:00 going to cardiology Office Note  Date:  05/16/2017   ID:  Hannah Kirby, DOB [birth year], MRN [id number]  PCP:  Hannah Sons, MD   Chief Complaint  Patient presents with  . other    6 month follow up. Meds reviewed by the pt. verbally. Pt. c/o shortness of breath with over exertion and indigestion.     HPI:  Hannah Kirby is a 72 year-old woman with a history of  diabetes,  obesity,  significantly improved with  weight loss on Victoza,  coronary artery disease with stenting of her RCA in January 2008,  long smoking history who continues to smoke,  hyperlipidemia  outbreak of her granulomatous disease in summary 2013, prednisone for this condition.  History pinched nerve and is on pain medication. who presents for routine followup of her coronary artery disease.  HBA1C 6.4 Total cholesterol at goal  Reports that she feels well, no TIA or stroke symptoms No regular exercise program Chronic shortness of breath Continues to smoke Recently spent 3 weeks at the beach  Long discussion concerning previous strokes Reviewed MRI results with her  EKG personally reviewed by myself on todays visit Shows normal sinus rhythm rate 76 bpm no significant ST or T wave changes  Other past medical history reviewed hospital with stroke 10/30/2016 Left upper extremity weakness on coming back from the beach while in the car She was on aspirin and Plavix at the time Echocardiogram normal ejection fractionno significant valve disease, moderate LVH, diastolic dysfunction Hospital records reviewed with the patient in detail MRI brain: Small acute right basal ganglia region infarct. Prior strokes MRA: negative. Carotid duplex showed bilateral 0-49% stenosis.  Other recent hospitalizations ER 06/17/16 with HTN Hospital records reviewed with the patient in detail Started amlodipine 2.5 daily Given ativan Per the patient  Back to the ER several days later for high  blood pressure  Amlodipine increased up to 5 mg daily  she has continued to have high pressures   anxiety symptoms recently, unclear why She reports panic attacks in January, claustrophobic in the house   No recent upper respiratory symptoms Poor diet recently, eating pastries   She is currently on albvuterol and spirivia  On victoza,   does not drink much fluids Continues to smoke one pack per day Denies having any significant chest pain on exertion  History of GERD and Barrett's esophagus per the patient. Takes omeprazole Chronic back pain  stress test for chest pain 07/29/2014 that showed no ischemia chronicproblems with her teeth  episode of Dehydration after diarrhea mid September 2017 Starting having rectal bleeding Stopped her Plavix for several days with improvement of her symptoms   working with Dr. Eddie Kirby on her diabetes.    PMH:   has a past medical history of Anemia, Arthritis, CAD (coronary artery disease), Chronic bronchitis ([place]), Chronic kidney disease (CKD), stage III (moderate) ([place]), COPD (chronic obstructive pulmonary disease) ([place]), Diabetes mellitus, FHx: allergies, GERD (gastroesophageal reflux disease), Goiter, Granulomatous disease ([place]), Hernia, Hyperlipidemia, Hypertension, Kidney stone on left side (2013), Microalbuminuria, Obesity, Smokers' cough ([place]), Spinal stenosis, and Stroke ([place]) (10/29/2016).  PSH:    Past Surgical History:  Procedure Laterality Date  . ABDOMINAL HYSTERECTOMY    . BREAST BIOPSY    . CATARACT EXTRACTION W/PHACO Right 03/01/2017   Procedure: CATARACT EXTRACTION PHACO AND INTRAOCULAR LENS PLACEMENT ([place]) RIGHT DIABETIC;  Surgeon: Leandrew Koyanagi, MD;  Location: [facility];  Service: Ophthalmology;  Laterality: Right;  . CATARACT  EXTRACTION W/PHACO Left 03/22/2017   Procedure: CATARACT EXTRACTION PHACO AND INTRAOCULAR LENS PLACEMENT ([place]) LEFT DIABETIC;  Surgeon: Leandrew Koyanagi, MD;  Location: [facility];  Service: Ophthalmology;  Laterality: Left;  Diabetic - insulin and oral meds  . COLONOSCOPY WITH PROPOFOL N/A 09/23/2014   Procedure: COLONOSCOPY WITH PROPOFOL;  Surgeon: Lollie Sails, MD;  Location: [facility] ENDOSCOPY;  Service: Endoscopy;  Laterality: N/A;  . CORONARY ANGIOPLASTY WITH STENT PLACEMENT  2008  . ESOPHAGOGASTRODUODENOSCOPY (EGD) WITH PROPOFOL N/A 12/30/2014   Procedure: ESOPHAGOGASTRODUODENOSCOPY (EGD) WITH PROPOFOL;  Surgeon: Lollie Sails, MD;  Location: [facility] ENDOSCOPY;  Service: Endoscopy;  Laterality: N/A;  . ESOPHAGOGASTRODUODENOSCOPY (EGD) WITH PROPOFOL N/A 07/19/2016   Procedure: ESOPHAGOGASTRODUODENOSCOPY (EGD) WITH PROPOFOL;  Surgeon: Lollie Sails, MD;  Location: [facility] ENDOSCOPY;  Service: Endoscopy;  Laterality: N/A;  . hysterectomy (other)      Current Outpatient Medications  Medication Sig Dispense Refill  . albuterol (VENTOLIN HFA) 108 (90 Base) MCG/ACT inhaler Inhale 2 puffs into the lungs every 4 (four) hours as needed for wheezing or shortness of breath. 48 g 3  . amLODipine (NORVASC) 5 MG tablet Take 1 tablet (5 mg total) by mouth daily. 90 tablet 3  . aspirin (ASPIR-81) 81 MG EC tablet Take 81 mg by mouth daily.      . chlorhexidine (PERIDEX) 0.12 % solution Use as directed 15 mLs in the mouth or throat 2 (two) times daily. 120 mL 6  . clopidogrel (PLAVIX) 75 MG tablet Take 1 tablet (75 mg total) by mouth daily. 90 tablet 3  . ezetimibe (ZETIA) 10 MG tablet Take 1 tablet (10 mg total) by mouth daily. 90 tablet 3  . fluticasone furoate-vilanterol (BREO ELLIPTA) 100-25 MCG/INH AEPB Inhale 1 puff into the lungs daily. 84 each 3  . HYDROcodone-acetaminophen (NORCO/VICODIN) 5-325 MG tablet Take 1 tablet by mouth every 6 (six) hours as needed. 120 tablet 0  . insulin glargine (LANTUS) 100 UNIT/ML injection Inject 12 Units into the skin daily.     . Liraglutide (VICTOZA) 18 MG/3ML SOLN injection Inject 1.8 mg into the skin daily.     Marland Kitchen lisinopril  (PRINIVIL,ZESTRIL) 40 MG tablet Take 1 tablet (40 mg total) by mouth daily. 90 tablet 3  . meclizine (ANTIVERT) 25 MG tablet Take by mouth as needed.     . metFORMIN (GLUCOPHAGE) 1000 MG tablet Take 1,000 mg by mouth 2 (two) times daily with a meal.    . metoprolol tartrate (LOPRESSOR) 25 MG tablet Take 1 tablet (25 mg total) by mouth 2 (two) times daily. 180 tablet 3  . montelukast (SINGULAIR) 10 MG tablet Take 1 tablet (10 mg total) by mouth at bedtime. (Patient taking differently: Take 10 mg by mouth as needed. ) 30 tablet 12  . Multiple Vitamins-Minerals (DAILY MULTI) TABS Take 1 tablet by mouth daily.     . nitroGLYCERIN (NITROSTAT) 0.4 MG SL tablet Place 1 tablet (0.4 mg total) under the tongue every 5 (five) minutes as needed. 25 tablet 6  . Omega-3 Fatty Acids (FISH OIL) 1000 MG CAPS Take 2 capsules by mouth 2 (two) times daily. Take 4    . ondansetron (ZOFRAN ODT) 4 MG disintegrating tablet Take 1 tablet (4 mg total) by mouth every 8 (eight) hours as needed for nausea or vomiting. 20 tablet 0  . RABEprazole (ACIPHEX) 20 MG tablet Take 20 mg by mouth daily.     . rosuvastatin (CRESTOR) 5 MG tablet Take 1 tablet (5 mg total) by mouth  daily. 90 tablet 3  . sucralfate (CARAFATE) 1 g tablet Take 1 g by mouth 2 (two) times daily.     Marland Kitchen tiotropium (SPIRIVA) 18 MCG inhalation capsule Place 1 capsule (18 mcg total) into inhaler and inhale daily. 90 capsule 4  . PHENYLEPHRINE-APAP-GUAIFENESIN PO Take by mouth 2 (two) times daily as needed.     No current facility-administered medications for this visit.      Allergies:   Sulfonamide derivatives; Statins; Sulfa antibiotics; Codeine; and Prednisone   Social History:  The patient  reports that she has been smoking cigarettes.  She has a 30.00 pack-year smoking history. She has never used smokeless tobacco. She reports that she drinks alcohol. She reports that she does not use drugs.   Family History:   family history includes Breast cancer in her  maternal aunt; Congestive Heart Failure in her mother; Diabetes in her mother; Heart failure in her mother; Lung cancer in her father; Stroke in her mother.    Review of Systems: Review of Systems  Constitutional: Negative.   Respiratory: Negative.   Cardiovascular: Negative.   Gastrointestinal: Negative.   Musculoskeletal: Positive for back pain.  Neurological: Negative.        Left hand weakness  Psychiatric/Behavioral: Negative.   All other systems reviewed and are negative.    PHYSICAL EXAM: VS:  BP 136/86 (BP Location: Left Arm, Patient Position: Sitting, Cuff Size: Normal)   Pulse 76   Ht 5\' 5"  (1.651 m)   Wt 162 lb 8 oz (73.7 kg)   BMI 27.04 kg/m  , BMI Body mass index is 27.04 kg/m. Constitutional:  oriented to person, place, and time. No distress.  HENT:  Head: Normocephalic and atraumatic.  Eyes:  no discharge. No scleral icterus.  Neck: Normal range of motion. Neck supple. No JVD present.  Cardiovascular: Normal rate, regular rhythm, normal heart sounds and intact distal pulses. Exam reveals no gallop and no friction rub. No edema No murmur heard. Pulmonary/Chest: Effort normal and breath sounds normal. No stridor. No respiratory distress.  no wheezes.  no rales.  no tenderness.  Abdominal: Soft.  no distension.  no tenderness.  Musculoskeletal: Normal range of motion.  no  tenderness or deformity.  Neurological:  normal muscle tone. Coordination normal. No atrophy Skin: Skin is warm and dry. No rash noted. not diaphoretic.  Psychiatric:  normal mood and affect. behavior is normal. Thought content normal.       Recent Labs: 10/29/2016: ALT 17; BUN 27; Potassium 3.7; Sodium 139 10/30/2016: Creatinine, Ser 1.01; Hemoglobin 12.0; Platelets 223    Lipid Panel Lab Results  Component Value Date   CHOL 129 10/30/2016   HDL 52 10/30/2016   LDLCALC 53 10/30/2016   TRIG 118 10/30/2016      Wt Readings from Last 3 Encounters:  05/16/17 162 lb 8 oz (73.7 kg)   03/22/17 163 lb (73.9 kg)  03/16/17 166 lb (75.3 kg)       ASSESSMENT AND PLAN:  Anxiety  prior history of panic attacks insomnia Managed by primary care Stable  Hyperlipidemia, unspecified hyperlipidemia type - Cholesterol is at goal on the current lipid regimen. No changes to the medications were made.  Coronary artery disease involving native coronary artery of native heart without angina pectoris -  Currently with no symptoms of angina. No further workup at this time. Continue current medication regimen.  Stable Recommended smoking cessation  CVA Small vessel disease, continues to smoke, diabetes, hyperlipidemia  Previous stroke seen on  MRA as well as acute stroke Long time spent discussing whether she needs TEE and loop monitor placement We will hold off for now given significant small vessel disease likely contributor to her stroke Recommended smoking cessation  Benign essential HTN -  Blood pressure is well controlled on today's visit. No changes made to the medications.  Chest pain at rest -  No chest pain on today's visit, no further workup at this time  Smoker  Discussed with her again the need to stop smoking given recent stroke  Still having difficulty  Chronic renal insufficiency  Likely secondary to Long-standing diabetes Creatinine 1.6, stable   Disposition:   F/U  6 months   Total encounter time more than 45 minutes  Greater than 50% was spent in counseling and coordination of care with the patient    Orders Placed This Encounter  Procedures  . EKG 12-Lead     Signed, Esmond Plants, M.D., Ph.D. 05/16/2017  [place], [place]

## 2017-05-16 ENCOUNTER — Ambulatory Visit (INDEPENDENT_AMBULATORY_CARE_PROVIDER_SITE_OTHER): Payer: Medicare Other | Admitting: Cardiovascular Disease

## 2017-05-16 ENCOUNTER — Encounter: Payer: Self-pay | Admitting: Cardiovascular Disease

## 2017-05-16 VITALS — BP 136/86 | HR 76 | Ht 65.0 in | Wt 162.5 lb

## 2017-05-16 DIAGNOSIS — J449 Chronic obstructive pulmonary disease, unspecified: Secondary | ICD-10-CM

## 2017-05-16 DIAGNOSIS — IMO0001 Reserved for inherently not codable concepts without codable children: Secondary | ICD-10-CM

## 2017-05-16 DIAGNOSIS — R600 Localized edema: Secondary | ICD-10-CM

## 2017-05-16 DIAGNOSIS — I25118 Atherosclerotic heart disease of native coronary artery with other forms of angina pectoris: Secondary | ICD-10-CM

## 2017-05-16 DIAGNOSIS — E782 Mixed hyperlipidemia: Secondary | ICD-10-CM

## 2017-05-16 DIAGNOSIS — F17200 Nicotine dependence, unspecified, uncomplicated: Secondary | ICD-10-CM

## 2017-05-16 DIAGNOSIS — I714 Abdominal aortic aneurysm, without rupture, unspecified: Secondary | ICD-10-CM

## 2017-05-16 DIAGNOSIS — E0821 Diabetes mellitus due to underlying condition with diabetic nephropathy: Secondary | ICD-10-CM | POA: Diagnosis not present

## 2017-05-16 NOTE — Patient Instructions (Addendum)
Today we talked about TEE and loop monitor Talk with neurology   Medication Instructions:   No medication changes made  Labwork:  No new labs needed  Testing/Procedures:  No further testing at this time   Follow-Up: It was a pleasure seeing you in the office today. Please call us if you have new issues that need to be addressed before your next appt.  [phone]  Your physician wants you to follow-up in: 6 months.  You will receive a reminder letter in the mail two months in advance. If you don't receive a letter, please call our office to schedule the follow-up appointment.  If you need a refill on your cardiac medications before your next appointment, please call your pharmacy.  For educational health videos Log in to : [email or web address] Or : [email or web address], password : [email or web address]

## 2017-05-17 ENCOUNTER — Encounter: Payer: Self-pay | Admitting: Family Medicine

## 2017-05-17 DIAGNOSIS — M48061 Spinal stenosis, lumbar region without neurogenic claudication: Secondary | ICD-10-CM

## 2017-05-18 DIAGNOSIS — L920 Granuloma annulare: Secondary | ICD-10-CM | POA: Diagnosis not present

## 2017-05-18 DIAGNOSIS — B029 Zoster without complications: Secondary | ICD-10-CM | POA: Diagnosis not present

## 2017-05-18 MED ORDER — HYDROCODONE-ACETAMINOPHEN 5-325 MG PO TABS: 1.0000 | ORAL_TABLET | Freq: Four times a day (QID) | ORAL | 0 refills | Status: DC | PRN

## 2017-05-22 DIAGNOSIS — I639 Cerebral infarction, unspecified: Secondary | ICD-10-CM | POA: Diagnosis not present

## 2017-05-22 DIAGNOSIS — R51 Headache: Secondary | ICD-10-CM | POA: Diagnosis not present

## 2017-06-01 DIAGNOSIS — Z86010 Personal history of colonic polyps: Secondary | ICD-10-CM | POA: Diagnosis not present

## 2017-06-01 DIAGNOSIS — K2270 Barrett's esophagus without dysplasia: Secondary | ICD-10-CM | POA: Diagnosis not present

## 2017-06-05 DIAGNOSIS — R809 Proteinuria, unspecified: Secondary | ICD-10-CM | POA: Diagnosis not present

## 2017-06-05 DIAGNOSIS — E1122 Type 2 diabetes mellitus with diabetic chronic kidney disease: Secondary | ICD-10-CM | POA: Diagnosis not present

## 2017-06-05 DIAGNOSIS — N183 Chronic kidney disease, stage 3 (moderate): Secondary | ICD-10-CM | POA: Diagnosis not present

## 2017-06-05 DIAGNOSIS — I129 Hypertensive chronic kidney disease with stage 1 through stage 4 chronic kidney disease, or unspecified chronic kidney disease: Secondary | ICD-10-CM | POA: Diagnosis not present

## 2017-06-05 DIAGNOSIS — N2581 Secondary hyperparathyroidism of renal origin: Secondary | ICD-10-CM | POA: Diagnosis not present

## 2017-06-06 ENCOUNTER — Other Ambulatory Visit: Payer: Self-pay | Admitting: *Deleted

## 2017-06-06 ENCOUNTER — Encounter: Payer: Self-pay | Admitting: Family Medicine

## 2017-06-06 DIAGNOSIS — J449 Chronic obstructive pulmonary disease, unspecified: Secondary | ICD-10-CM

## 2017-06-06 MED ORDER — TIOTROPIUM BROMIDE MONOHYDRATE 18 MCG IN CAPS: 18.0000 ug | ORAL_CAPSULE | Freq: Every day | RESPIRATORY_TRACT | 4 refills | Status: DC

## 2017-06-07 DIAGNOSIS — I129 Hypertensive chronic kidney disease with stage 1 through stage 4 chronic kidney disease, or unspecified chronic kidney disease: Secondary | ICD-10-CM | POA: Diagnosis not present

## 2017-06-07 DIAGNOSIS — N2581 Secondary hyperparathyroidism of renal origin: Secondary | ICD-10-CM | POA: Diagnosis not present

## 2017-06-07 DIAGNOSIS — E1122 Type 2 diabetes mellitus with diabetic chronic kidney disease: Secondary | ICD-10-CM | POA: Diagnosis not present

## 2017-06-07 DIAGNOSIS — R809 Proteinuria, unspecified: Secondary | ICD-10-CM | POA: Diagnosis not present

## 2017-06-07 DIAGNOSIS — N183 Chronic kidney disease, stage 3 (moderate): Secondary | ICD-10-CM | POA: Diagnosis not present

## 2017-06-19 ENCOUNTER — Encounter: Payer: Self-pay | Admitting: Family Medicine

## 2017-06-19 ENCOUNTER — Other Ambulatory Visit: Payer: Self-pay | Admitting: *Deleted

## 2017-06-19 DIAGNOSIS — M48061 Spinal stenosis, lumbar region without neurogenic claudication: Secondary | ICD-10-CM

## 2017-06-19 MED ORDER — HYDROCODONE-ACETAMINOPHEN 5-325 MG PO TABS: 1.0000 | ORAL_TABLET | Freq: Four times a day (QID) | ORAL | 0 refills | Status: DC | PRN

## 2017-07-19 ENCOUNTER — Encounter: Payer: Self-pay | Admitting: Cardiovascular Disease

## 2017-07-24 ENCOUNTER — Encounter: Payer: Self-pay | Admitting: Family Medicine

## 2017-07-24 ENCOUNTER — Other Ambulatory Visit: Payer: Self-pay | Admitting: *Deleted

## 2017-07-24 DIAGNOSIS — M48061 Spinal stenosis, lumbar region without neurogenic claudication: Secondary | ICD-10-CM

## 2017-07-24 MED ORDER — HYDROCODONE-ACETAMINOPHEN 5-325 MG PO TABS: 1.0000 | ORAL_TABLET | Freq: Four times a day (QID) | ORAL | 0 refills | Status: DC | PRN

## 2017-07-25 DIAGNOSIS — Z86018 Personal history of other benign neoplasm: Secondary | ICD-10-CM | POA: Diagnosis not present

## 2017-07-25 DIAGNOSIS — L578 Other skin changes due to chronic exposure to nonionizing radiation: Secondary | ICD-10-CM | POA: Diagnosis not present

## 2017-07-25 DIAGNOSIS — Z859 Personal history of malignant neoplasm, unspecified: Secondary | ICD-10-CM | POA: Diagnosis not present

## 2017-07-25 DIAGNOSIS — L570 Actinic keratosis: Secondary | ICD-10-CM | POA: Diagnosis not present

## 2017-07-25 DIAGNOSIS — L920 Granuloma annulare: Secondary | ICD-10-CM | POA: Diagnosis not present

## 2017-08-04 ENCOUNTER — Ambulatory Visit (INDEPENDENT_AMBULATORY_CARE_PROVIDER_SITE_OTHER): Payer: Medicare Other

## 2017-08-04 VITALS — BP 126/74 | HR 72 | Temp 98.7°F | Ht 65.0 in | Wt 166.0 lb

## 2017-08-04 DIAGNOSIS — Z0000 Encounter for general adult medical examination without abnormal findings: Secondary | ICD-10-CM | POA: Diagnosis not present

## 2017-08-04 NOTE — Patient Instructions (Addendum)
Hannah Kirby , Thank you for taking time to come for your Medicare Wellness Visit. I appreciate your ongoing commitment to your health goals. Please review the following plan we discussed and let me know if I can assist you in the future.   Screening recommendations/referrals: Colonoscopy: Up to date Mammogram: Up to date Bone Density: Up to date Recommended yearly ophthalmology/optometry visit for glaucoma screening and checkup Recommended yearly dental visit for hygiene and checkup  Vaccinations: Influenza vaccine: Up to date Pneumococcal vaccine: Up to date Tdap vaccine: Pt declines today.  Shingles vaccine: Pt declines today.     Advanced directives: Advance directive discussed with you today. I have provided a copy for you to complete at home and have notarized. Once this is complete please bring a copy in to our office so we can scan it into your chart.  Conditions/risks identified: Smoking cessation.   Next appointment: None. Pt declined scheduling CPE for this year and the AWV for 2020.    Preventive Care 71 Years and Older, Female Preventive care refers to lifestyle choices and visits with your health care provider that can promote health and wellness. What does preventive care include?  A yearly physical exam. This is also called an annual well check.  Dental exams once or twice a year.  Routine eye exams. Ask your health care provider how often you should have your eyes checked.  Personal lifestyle choices, including:  Daily care of your teeth and gums.  Regular physical activity.  Eating a healthy diet.  Avoiding tobacco and drug use.  Limiting alcohol use.  Practicing safe sex.  Taking low-dose aspirin every day.  Taking vitamin and mineral supplements as recommended by your health care provider. What happens during an annual well check? The services and screenings done by your health care provider during your annual well check will depend on your age,  overall health, lifestyle risk factors, and family history of disease. Counseling  Your health care provider may ask you questions about your:  Alcohol use.  Tobacco use.  Drug use.  Emotional well-being.  Home and relationship well-being.  Sexual activity.  Eating habits.  History of falls.  Memory and ability to understand (cognition).  Work and work Statistician.  Reproductive health. Screening  You may have the following tests or measurements:  Height, weight, and BMI.  Blood pressure.  Lipid and cholesterol levels. These may be checked every 5 years, or more frequently if you are over 23 years old.  Skin check.  Lung cancer screening. You may have this screening every year starting at age 22 if you have a 30-pack-year history of smoking and currently smoke or have quit within the past 15 years.  Fecal occult blood test (FOBT) of the stool. You may have this test every year starting at age 5.  Flexible sigmoidoscopy or colonoscopy. You may have a sigmoidoscopy every 5 years or a colonoscopy every 10 years starting at age 14.  Hepatitis C blood test.  Hepatitis B blood test.  Sexually transmitted disease (STD) testing.  Diabetes screening. This is done by checking your blood sugar (glucose) after you have not eaten for a while (fasting). You may have this done every 1-3 years.  Bone density scan. This is done to screen for osteoporosis. You may have this done starting at age 17.  Mammogram. This may be done every 1-2 years. Talk to your health care provider about how often you should have regular mammograms. Talk with your health care  provider about your test results, treatment options, and if necessary, the need for more tests. Vaccines  Your health care provider may recommend certain vaccines, such as:  Influenza vaccine. This is recommended every year.  Tetanus, diphtheria, and acellular pertussis (Tdap, Td) vaccine. You may need a Td booster every 10  years.  Zoster vaccine. You may need this after age 38.  Pneumococcal 13-valent conjugate (PCV13) vaccine. One dose is recommended after age 49.  Pneumococcal polysaccharide (PPSV23) vaccine. One dose is recommended after age 35. Talk to your health care provider about which screenings and vaccines you need and how often you need them. This information is not intended to replace advice given to you by your health care provider. Make sure you discuss any questions you have with your health care provider. Document Released: 03/06/2015 Document Revised: 10/28/2015 Document Reviewed: 12/09/2014 Elsevier Interactive Patient Education  2017 [place] Prevention in the Home Falls can cause injuries. They can happen to people of all ages. There are many things you can do to make your home safe and to help prevent falls. What can I do on the outside of my home?  Regularly fix the edges of walkways and driveways and fix any cracks.  Remove anything that might make you trip as you walk through a door, such as a raised step or threshold.  Trim any bushes or trees on the path to your home.  Use bright outdoor lighting.  Clear any walking paths of anything that might make someone trip, such as rocks or tools.  Regularly check to see if handrails are loose or broken. Make sure that both sides of any steps have handrails.  Any raised decks and porches should have guardrails on the edges.  Have any leaves, snow, or ice cleared regularly.  Use sand or salt on walking paths during winter.  Clean up any spills in your garage right away. This includes oil or grease spills. What can I do in the bathroom?  Use night lights.  Install grab bars by the toilet and in the tub and shower. Do not use towel bars as grab bars.  Use non-skid mats or decals in the tub or shower.  If you need to sit down in the shower, use a plastic, non-slip stool.  Keep the floor dry. Clean up any water that  spills on the floor as soon as it happens.  Remove soap buildup in the tub or shower regularly.  Attach bath mats securely with double-sided non-slip rug tape.  Do not have throw rugs and other things on the floor that can make you trip. What can I do in the bedroom?  Use night lights.  Make sure that you have a light by your bed that is easy to reach.  Do not use any sheets or blankets that are too big for your bed. They should not hang down onto the floor.  Have a firm chair that has side arms. You can use this for support while you get dressed.  Do not have throw rugs and other things on the floor that can make you trip. What can I do in the kitchen?  Clean up any spills right away.  Avoid walking on wet floors.  Keep items that you use a lot in easy-to-reach places.  If you need to reach something above you, use a strong step stool that has a grab bar.  Keep electrical cords out of the way.  Do not use floor polish  or wax that makes floors slippery. If you must use wax, use non-skid floor wax.  Do not have throw rugs and other things on the floor that can make you trip. What can I do with my stairs?  Do not leave any items on the stairs.  Make sure that there are handrails on both sides of the stairs and use them. Fix handrails that are broken or loose. Make sure that handrails are as long as the stairways.  Check any carpeting to make sure that it is firmly attached to the stairs. Fix any carpet that is loose or worn.  Avoid having throw rugs at the top or bottom of the stairs. If you do have throw rugs, attach them to the floor with carpet tape.  Make sure that you have a light switch at the top of the stairs and the bottom of the stairs. If you do not have them, ask someone to add them for you. What else can I do to help prevent falls?  Wear shoes that:  Do not have high heels.  Have rubber bottoms.  Are comfortable and fit you well.  Are closed at the  toe. Do not wear sandals.  If you use a stepladder:  Make sure that it is fully opened. Do not climb a closed stepladder.  Make sure that both sides of the stepladder are locked into place.  Ask someone to hold it for you, if possible.  Clearly mark and make sure that you can see:  Any grab bars or handrails.  First and last steps.  Where the edge of each step is.  Use tools that help you move around (mobility aids) if they are needed. These include:  Canes.  Walkers.  Scooters.  Crutches.  Turn on the lights when you go into a dark area. Replace any light bulbs as soon as they burn out.  Set up your furniture so you have a clear path. Avoid moving your furniture around.  If any of your floors are uneven, fix them.  If there are any pets around you, be aware of where they are.  Review your medicines with your doctor. Some medicines can make you feel dizzy. This can increase your chance of falling. Ask your doctor what other things that you can do to help prevent falls. This information is not intended to replace advice given to you by your health care provider. Make sure you discuss any questions you have with your health care provider. Document Released: 12/04/2008 Document Revised: 07/16/2015 Document Reviewed: 03/14/2014 Elsevier Interactive Patient Education  2017 [employer].

## 2017-08-04 NOTE — Progress Notes (Signed)
Subjective:   Hannah Kirby is a 72 y.o. female who presents for Medicare Annual (Subsequent) preventive examination.  Review of Systems:  N/A  Cardiac Risk Factors include: advanced age (>50men, >62 women);diabetes mellitus;dyslipidemia;hypertension;smoking/ tobacco exposure     Objective:     Vitals: BP 126/74 (BP Location: Right Arm)   Pulse 72   Temp 98.7 F (37.1 C) (Oral)   Ht 5\' 5"  (1.651 m)   Wt 166 lb (75.3 kg)   BMI 27.62 kg/m   Body mass index is 27.62 kg/m.  Advanced Directives 08/04/2017 03/22/2017 03/01/2017 11/24/2016 10/30/2016 10/29/2016 08/06/2016  Does Patient Have a Medical Advance Directive? No No No No No No No  Would patient like information on creating a medical advance directive? Yes (MAU/Ambulatory/Procedural Areas - Information given) No - Patient declined No - Patient declined Yes (MAU/Ambulatory/Procedural Areas - Information given) Yes (Inpatient - patient requests chaplain consult to create a medical advance directive) - -    Tobacco Social History   Tobacco Use  Smoking Status Current Every Day Smoker  . Packs/day: 0.75  . Years: 40.00  . Pack years: 30.00  . Types: Cigarettes  Smokeless Tobacco Never Used     Ready to quit: No Counseling given: No   Clinical Intake:  Pre-visit preparation completed: Yes  Pain : 0-10 Pain Score: 4  Pain Type: Chronic pain Pain Location: Back Pain Orientation: Lower Pain Descriptors / Indicators: Aching, Dull Pain Frequency: Constant     Nutritional Status: BMI 25 -29 Overweight Nutritional Risks: None Diabetes: Yes(type 2 ) CBG done?: No Did pt. bring in CBG monitor from home?: No  How often do you need to have someone help you when you read instructions, pamphlets, or other written materials from your doctor or pharmacy?: 1 - Never  Interpreter Needed?: No  Information entered by :: [facility], LPN  Past Medical History:  Diagnosis Date  . Anemia   . Arthritis   . CAD (coronary  artery disease)    a. cath 02/2006: BMS x 2 to RCA, cath o/w without significant coronary disease; b. nuclear stress test 07/2014: no signs of ischemia, no ekg changes concerning for ischemia, low risk study/normal study  . Chronic bronchitis ([place])    secondary to cigarette smoking  . Chronic kidney disease (CKD), stage III (moderate) (HCC)   . COPD (chronic obstructive pulmonary disease) ([place])   . Diabetes mellitus   . FHx: allergies   . GERD (gastroesophageal reflux disease)   . Goiter   . 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 Hernia   . Hyperlipidemia   . Hypertension   . Kidney stone on left side 2013  . Microalbuminuria   . Obesity   . Smokers' cough ([place])   . Spinal stenosis   . Stroke ([place]) 10/29/2016   mild left side weakness   Past Surgical History:  Procedure Laterality Date  . ABDOMINAL HYSTERECTOMY    . BREAST BIOPSY    . CATARACT EXTRACTION W/PHACO Right 03/01/2017   Procedure: CATARACT EXTRACTION PHACO AND INTRAOCULAR LENS PLACEMENT ([place]) RIGHT DIABETIC;  Surgeon: Leandrew Koyanagi, MD;  Location: [facility];  Service: Ophthalmology;  Laterality: Right;  . CATARACT EXTRACTION W/PHACO Left 03/22/2017   Procedure: CATARACT EXTRACTION PHACO AND INTRAOCULAR LENS PLACEMENT ([place]) LEFT DIABETIC;  Surgeon: Leandrew Koyanagi, MD;  Location: [facility];  Service: Ophthalmology;  Laterality: Left;  Diabetic - insulin and oral meds  . COLONOSCOPY WITH PROPOFOL N/A 09/23/2014   Procedure: COLONOSCOPY WITH PROPOFOL;  Surgeon: Lollie Sails, MD;  Location[facility] ENDOSCOPY;  Service: Endoscopy;  Laterality: N/A;  . CORONARY ANGIOPLASTY WITH STENT PLACEMENT  2008  . ESOPHAGOGASTRODUODENOSCOPY (EGD) WITH PROPOFOL N/A 12/30/2014   Procedure: ESOPHAGOGASTRODUODENOSCOPY (EGD) WITH PROPOFOL;  Surgeon: Lollie Sails, MD;  Location[facility]COPY;  Service: Endoscopy;  Laterality: N/A;  . ESOPHAGOGASTRODUODENOSCOPY (EGD) WITH PROPOFOL N/A 07/19/2016   Procedure:  ESOPHAGOGASTRODUODENOSCOPY (EGD) WITH PROPOFOL;  Surgeon: Lollie Sails, MD;  Location: [facility] ENDOSCOPY;  Service: Endoscopy;  Laterality: N/A;  . hysterectomy (other)     Family History  Problem Relation Age of Onset  . Heart failure Mother   . Stroke Mother   . Diabetes Mother   . Congestive Heart Failure Mother   . Lung cancer Father   . Breast cancer Maternal Aunt    Social History   Socioeconomic History  . Marital status: Married    Spouse name: Chayanne Filippi  . Number of children: 3  . Years of education: Not on file  . Highest education level: 12th grade  Occupational History  . Occupation: retired  Scientific laboratory technician  . Financial resource strain: Not hard at all  . Food insecurity:    Worry: Never true    Inability: Never true  . Transportation needs:    Medical: No    Non-medical: No  Tobacco Use  . Smoking status: Current Every Day Smoker    Packs/day: 0.75    Years: 40.00    Pack years: 30.00    Types: Cigarettes  . Smokeless tobacco: Never Used  Substance and Sexual Activity  . Alcohol use: Yes    Comment: 1/month- wine  . Drug use: No  . Sexual activity: Not on file  Lifestyle  . Physical activity:    Days per week: Not on file    Minutes per session: Not on file  . Stress: Not at all  Relationships  . Social connections:    Talks on phone: Not on file    Gets together: Not on file    Attends religious service: Not on file    Active member of club or organization: Not on file    Attends meetings of clubs or organizations: Not on file    Relationship status: Not on file  Other Topics Concern  . Not on file  Social History Narrative   Retired. Married. Regularly exercise.     Outpatient Encounter Medications as of 08/04/2017  Medication Sig  . albuterol (VENTOLIN HFA) 108 (90 Base) MCG/ACT inhaler Inhale 2 puffs into the lungs every 4 (four) hours as needed for wheezing or shortness of breath.  Marland Kitchen amLODipine (NORVASC) 5 MG tablet Take 1 tablet  (5 mg total) by mouth daily.  Marland Kitchen aspirin (ASPIR-81) 81 MG EC tablet Take 81 mg by mouth daily.    . chlorhexidine (PERIDEX) 0.12 % solution Use as directed 15 mLs in the mouth or throat 2 (two) times daily. (Patient taking differently: Use as directed 15 mLs in the mouth or throat 2 (two) times daily. )  . clopidogrel (PLAVIX) 75 MG tablet Take 1 tablet (75 mg total) by mouth daily.  Marland Kitchen ezetimibe (ZETIA) 10 MG tablet Take 1 tablet (10 mg total) by mouth daily.  . fluticasone furoate-vilanterol (BREO ELLIPTA) 100-25 MCG/INH AEPB Inhale 1 puff into the lungs daily.  Marland Kitchen HYDROcodone-acetaminophen (NORCO/VICODIN) 5-325 MG tablet Take 1 tablet by mouth every 6 (six) hours as needed.  . insulin glargine (LANTUS) 100 UNIT/ML injection Inject 15 Units into the  skin daily.   . Liraglutide (VICTOZA) 18 MG/3ML SOLN injection Inject 1.8 mg into the skin daily.   Marland Kitchen lisinopril (PRINIVIL,ZESTRIL) 40 MG tablet Take 1 tablet (40 mg total) by mouth daily.  . meclizine (ANTIVERT) 25 MG tablet Take by mouth as needed.   . metoprolol tartrate (LOPRESSOR) 25 MG tablet Take 1 tablet (25 mg total) by mouth 2 (two) times daily.  . montelukast (SINGULAIR) 10 MG tablet Take 1 tablet (10 mg total) by mouth at bedtime. (Patient taking differently: Take 10 mg by mouth as needed. )  . Multiple Vitamins-Minerals (DAILY MULTI) TABS Take 1 tablet by mouth daily.   . nitroGLYCERIN (NITROSTAT) 0.4 MG SL tablet Place 1 tablet (0.4 mg total) under the tongue every 5 (five) minutes as needed.  . Omega-3 Fatty Acids (FISH OIL) 1000 MG CAPS Take 2 capsules by mouth 2 (two) times daily. Take 4  . ondansetron (ZOFRAN ODT) 4 MG disintegrating tablet Take 1 tablet (4 mg total) by mouth every 8 (eight) hours as needed for nausea or vomiting.  . RABEprazole (ACIPHEX) 20 MG tablet Take 20 mg by mouth daily.   . rosuvastatin (CRESTOR) 5 MG tablet Take 1 tablet (5 mg total) by mouth daily.  . sucralfate (CARAFATE) 1 g tablet Take 1 g by mouth 2 (two)  times daily.   Marland Kitchen tiotropium (SPIRIVA) 18 MCG inhalation capsule Place 1 capsule (18 mcg total) into inhaler and inhale daily.  . metFORMIN (GLUCOPHAGE) 1000 MG tablet Take 1,000 mg by mouth 2 (two) times daily with a meal.  . PHENYLEPHRINE-APAP-GUAIFENESIN PO Take by mouth 2 (two) times daily as needed.   No facility-administered encounter medications on file as of 08/04/2017.     Activities of Daily Living In your present state of health, do you have any difficulty performing the following activities: 08/04/2017 03/22/2017  Hearing? N N  Vision? N N  Difficulty concentrating or making decisions? N N  Walking or climbing stairs? Y N  Comment Due to back pain. -  Dressing or bathing? - N  Doing errands, shopping? N -  Preparing Food and eating ? N -  Using the Toilet? N -  In the past six months, have you accidently leaked urine? N -  Do you have problems with loss of bowel control? N -  Managing your Medications? N -  Managing your Finances? N -  Housekeeping or managing your Housekeeping? N -  Some recent data might be hidden    Patient Care Team: Birdie Sons, MD as PCP - General (Family Medicine) [name], Kathlene November, MD as Consulting Physician (Cardiology) Dingeldein, Remo Lipps, MD as Consulting Physician (Ophthalmology) Lollie Sails, MD as Consulting Physician (Gastroenterology) Lavonia Dana, MD as Consulting Physician (Internal Medicine) Lucky Cowboy Erskine Squibb, MD as Referring Physician (Vascular Surgery)    Assessment:   This is a routine wellness examination for Chara.  Exercise Activities and Dietary recommendations Current Exercise Habits: The patient does not participate in regular exercise at present, Exercise limited by: orthopedic condition(s)  Goals    . Increase water intake       Fall Risk Fall Risk  08/04/2017 08/03/2016 07/27/2015 07/28/2014  Falls in the past year? No No No No   Is the patient's home free of loose throw rugs in walkways, pet beds, electrical  cords, etc?   yes      Grab bars in the bathroom? no      Handrails on the stairs?   no  Adequate lighting?   yes  Timed Get Up and Go performed: N/A  Depression Screen PHQ 2/9 Scores 08/04/2017 08/03/2016 08/03/2016 07/27/2015  PHQ - 2 Score 0 0 0 0  PHQ- 9 Score - 0 - -     Cognitive Function: Pt declined screening today.      6CIT Screen 08/03/2016  What Year? 0 points  What month? 0 points  What time? 0 points  Count back from 20 0 points  Months in reverse 0 points  Repeat phrase 0 points  Total Score 0    Immunization History  Administered Date(s) Administered  . Influenza, High Dose Seasonal PF 11/01/2014, 11/24/2015, 11/08/2016  . Influenza-Unspecified 10/22/2013  . Pneumococcal Conjugate-13 05/22/2013  . Pneumococcal Polysaccharide-23 11/02/2011  . Zoster 04/23/2012    Qualifies for Shingles Vaccine? Due for Shingles vaccine. Declined my offer to administer today. Education has been provided regarding the importance of this vaccine. Pt has been advised to call her insurance company to determine her out of pocket expense. Advised she may also receive this vaccine at her local pharmacy or Health Dept. Verbalized acceptance and understanding.  Screening Tests Health Maintenance  Topic Date Due  . FOOT EXAM  11/18/2016  . HEMOGLOBIN A1C  04/29/2017  . TETANUS/TDAP  02/21/2026 (Originally 07/12/1964)  . INFLUENZA VACCINE  09/21/2017  . OPHTHALMOLOGY EXAM  12/29/2017  . COLONOSCOPY  09/22/2024  . DEXA SCAN  Completed  . Hepatitis C Screening  Completed  . PNA vac Low Risk Adult  Completed  . MAMMOGRAM  Discontinued    Cancer Screenings: Lung: Low Dose CT Chest recommended if Age 53-80 years, 30 pack-year currently smoking OR have quit w/in 15years. Patient does not qualify. Breast:  Up to date on Mammogram? Yes   Up to date of Bone Density/Dexa? Yes Colorectal: Up to date  Additional Screenings:  Hepatitis C Screening: Up to date     Plan:  I have  personally reviewed and addressed the Medicare Annual Wellness questionnaire and have noted the following in the patient's chart:  A. Medical and social history B. Use of alcohol, tobacco or illicit drugs  C. Current medications and supplements D. Functional ability and status E.  Nutritional status F.  Physical activity G. Advance directives H. List of other physicians I.  Hospitalizations, surgeries, and ER visits in previous 12 months J.  [place] such as hearing and vision if needed, cognitive and depression L. Referrals and appointments - none  In addition, I have reviewed and discussed with patient certain preventive protocols, quality metrics, and best practice recommendations. A written personalized care plan for preventive services as well as general preventive health recommendations were provided to patient.  See attached scanned questionnaire for additional information.   Signed,  Fabio Neighbors, LPN Nurse Health Advisor   Nurse Recommendations: Pt needs a diabetic foot exam and her Hgb A1c checked at her next OV. Pt declined setting up a CPE today. Pt to CB after checking her schedule to set up an appointment.

## 2017-08-14 ENCOUNTER — Encounter: Payer: Self-pay | Admitting: Cardiovascular Disease

## 2017-08-18 ENCOUNTER — Telehealth: Payer: Self-pay | Admitting: Cardiovascular Disease

## 2017-08-18 NOTE — Telephone Encounter (Signed)
° °  [facility] Pre-operative Risk Assessment    Request for surgical clearance:  1. What type of surgery is being performed? Colonoscopy   2. When is this surgery scheduled? 09/04/17   3. What type of clearance is required (medical clearance vs. Pharmacy clearance to hold med vs. Both)? Pharmacy   4. Are there any medications that need to be held prior to surgery and how long? Needing to know about Plavix   5. Practice name and name of physician performing surgery? [facility] Dr Loistine Simas  6. What is your office phone number [phone]  7.   What is your office fax number [phone]   8.   Anesthesia type (None, local, MAC, general) ? Monitored

## 2017-08-18 NOTE — Telephone Encounter (Signed)
To Dr. [name] to review.  Patient currently on ASA & Plavix.

## 2017-08-18 NOTE — Telephone Encounter (Signed)
Ok to stop plavix for 5 days prior to procedure Stay on asa Restart plavix when approved by surgery

## 2017-08-21 NOTE — Telephone Encounter (Signed)
Clearance faxed to Dr. Marton Redwood office via [place].

## 2017-08-22 ENCOUNTER — Encounter: Payer: Self-pay | Admitting: Family Medicine

## 2017-08-27 ENCOUNTER — Encounter: Payer: Self-pay | Admitting: Family Medicine

## 2017-08-27 DIAGNOSIS — M48061 Spinal stenosis, lumbar region without neurogenic claudication: Secondary | ICD-10-CM

## 2017-08-28 ENCOUNTER — Ambulatory Visit (INDEPENDENT_AMBULATORY_CARE_PROVIDER_SITE_OTHER): Payer: Medicare Other | Admitting: Family Medicine

## 2017-08-28 ENCOUNTER — Encounter: Payer: Self-pay | Admitting: Family Medicine

## 2017-08-28 VITALS — BP 142/98 | HR 73 | Temp 98.4°F | Resp 16 | Wt 167.6 lb

## 2017-08-28 DIAGNOSIS — N2581 Secondary hyperparathyroidism of renal origin: Secondary | ICD-10-CM | POA: Diagnosis not present

## 2017-08-28 DIAGNOSIS — I129 Hypertensive chronic kidney disease with stage 1 through stage 4 chronic kidney disease, or unspecified chronic kidney disease: Secondary | ICD-10-CM | POA: Diagnosis not present

## 2017-08-28 DIAGNOSIS — R809 Proteinuria, unspecified: Secondary | ICD-10-CM | POA: Diagnosis not present

## 2017-08-28 DIAGNOSIS — I25118 Atherosclerotic heart disease of native coronary artery with other forms of angina pectoris: Secondary | ICD-10-CM | POA: Diagnosis not present

## 2017-08-28 DIAGNOSIS — N183 Chronic kidney disease, stage 3 (moderate): Secondary | ICD-10-CM | POA: Diagnosis not present

## 2017-08-28 DIAGNOSIS — E1122 Type 2 diabetes mellitus with diabetic chronic kidney disease: Secondary | ICD-10-CM | POA: Diagnosis not present

## 2017-08-28 DIAGNOSIS — J441 Chronic obstructive pulmonary disease with (acute) exacerbation: Secondary | ICD-10-CM | POA: Diagnosis not present

## 2017-08-28 MED ORDER — HYDROCODONE-ACETAMINOPHEN 5-325 MG PO TABS: 1.0000 | ORAL_TABLET | Freq: Four times a day (QID) | ORAL | 0 refills | Status: DC | PRN

## 2017-08-28 MED ORDER — PREDNISONE 20 MG PO TABS: ORAL_TABLET | ORAL | 0 refills | Status: DC

## 2017-08-28 MED ORDER — AMOXICILLIN-POT CLAVULANATE 875-125 MG PO TABS
1.0000 | ORAL_TABLET | Freq: Two times a day (BID) | ORAL | 0 refills | Status: DC
Start: 2017-08-28 — End: 2017-09-21

## 2017-08-28 NOTE — Progress Notes (Signed)
  Subjective:     Patient ID: Hannah Kirby, female   DOB: 03/25/1945, 72 y.o.   MRN: [id number] Chief Complaint  Patient presents with  . Cough    Patient comes in office today with complaints of cough and congestion for two weeks. Patient reports the following; shortness of breath and wheezing, cough productive of yellow phleg, sore throat, sinys pain and pressure. Patient states that she has taked otc Mucinex and [place] Hbp   HPI Reports compliance with inhalers and has increased albuterol use to 3 x day while ill. Has continued to smoke 3/4 ppd. Accompanied by her husband today.  Review of Systems     Objective:   Physical Exam  Constitutional: She appears well-developed and well-nourished. No distress.  Ears: T.M's intact without inflammation Sinuses: mild right maxillary sinus tenderness Throat: no tonsillar enlargement or exudate Neck: no cervical adenopathy Lungs: diminished breath sound with transient posterior expiratory wheezes bilaterally (albuterol use prior to appointment).     Assessment:    1. COPD exacerbation ([place]): Add Augmentin and prednisone    Plan:    Continue Mucinex and Albuterol. Add prednisone if increasing s.o.b.despite abx. Minimize smoking.

## 2017-08-28 NOTE — Patient Instructions (Signed)
Continue albuterol every 4-6 hour and Mucinex. May add saline irrigation for your sinuses. Add the prednisone if increased shortness of breath despite antibiotic. Minimize smoking while ill.

## 2017-09-21 ENCOUNTER — Encounter: Payer: Self-pay | Admitting: Family Medicine

## 2017-09-21 ENCOUNTER — Ambulatory Visit (INDEPENDENT_AMBULATORY_CARE_PROVIDER_SITE_OTHER): Payer: Medicare Other | Admitting: Family Medicine

## 2017-09-21 VITALS — BP 157/88 | HR 82 | Temp 98.4°F | Resp 16 | Ht 65.0 in | Wt 164.0 lb

## 2017-09-21 DIAGNOSIS — N183 Chronic kidney disease, stage 3 unspecified: Secondary | ICD-10-CM

## 2017-09-21 DIAGNOSIS — E0821 Diabetes mellitus due to underlying condition with diabetic nephropathy: Secondary | ICD-10-CM

## 2017-09-21 DIAGNOSIS — I25118 Atherosclerotic heart disease of native coronary artery with other forms of angina pectoris: Secondary | ICD-10-CM

## 2017-09-21 DIAGNOSIS — J449 Chronic obstructive pulmonary disease, unspecified: Secondary | ICD-10-CM

## 2017-09-21 DIAGNOSIS — J019 Acute sinusitis, unspecified: Secondary | ICD-10-CM

## 2017-09-21 DIAGNOSIS — I10 Essential (primary) hypertension: Secondary | ICD-10-CM | POA: Diagnosis not present

## 2017-09-21 DIAGNOSIS — F17200 Nicotine dependence, unspecified, uncomplicated: Secondary | ICD-10-CM | POA: Diagnosis not present

## 2017-09-21 MED ORDER — CEFDINIR 300 MG PO CAPS: 300.0000 mg | ORAL_CAPSULE | Freq: Two times a day (BID) | ORAL | 0 refills | Status: AC

## 2017-09-21 MED ORDER — FLUTICASONE FUROATE-VILANTEROL 100-25 MCG/INH IN AEPB: 1.0000 | INHALATION_SPRAY | Freq: Every day | RESPIRATORY_TRACT | 3 refills | Status: DC

## 2017-09-21 MED ORDER — BUPROPION HCL ER (SR) 150 MG PO TB12: ORAL_TABLET | ORAL | 5 refills | Status: DC

## 2017-09-21 MED ORDER — ALBUTEROL SULFATE HFA 108 (90 BASE) MCG/ACT IN AERS: 2.0000 | INHALATION_SPRAY | RESPIRATORY_TRACT | 3 refills | Status: DC | PRN

## 2017-09-21 MED ORDER — TIOTROPIUM BROMIDE MONOHYDRATE 18 MCG IN CAPS: 18.0000 ug | ORAL_CAPSULE | Freq: Every day | RESPIRATORY_TRACT | 4 refills | Status: DC

## 2017-09-21 NOTE — Progress Notes (Signed)
Patient: Hannah Kirby Female    DOB: 03/27/1945   72 y.o.   MRN: [id number] Visit Date: 09/21/2017  Today's Provider: Lelon Huh, MD   Chief Complaint  Patient presents with  . Follow-up  . Diabetes  . Hypertension  . Hyperlipidemia   Subjective:    HPI   Hypertension, follow-up:  BP Readings from Last 3 Encounters:  09/21/17 (!) 157/88  08/28/17 (!) 142/98  08/04/17 126/74    She was last seen for hypertension 7 months ago.  BP at that visit was 142/92. Management since that visit includes; no changes.She reports fair compliance with treatment.  She was diagnosed with Granloma Annularum which can be associated with amlodipine, so she has discontinued amlodipine. She reports Dr. [name] is aware of this, and that skin lesions have improved since stopping medications, although they have not resolved. She has follow up with Dr. [name] next month.   ---------------------------------------------------------------  Cerebrovascular disease From 03/10/2017-no changes.  Lab Results  Component Value Date   CHOL 129 10/30/2016   HDL 52 10/30/2016   LDLCALC 53 10/30/2016   TRIG 118 10/30/2016   CHOLHDL 2.5 10/30/2016      Chronic obstructive pulmonary disease, unspecified COPD type ([place]) She was seen by Mariel Sleet for exacerbation last month and treated with Augmentin, and prednisone and reports symptoms have completely resolved. However she has been having more sinus pressure and congestion the last several days with some green nasal drainage.   She is scheduled to establish with Dr. Honor Junes for diabetes in September. Reports sugars have been fairly well controlled.     Allergies  Allergen Reactions  . Sulfonamide Derivatives Other (See Comments)    Last taken as a child; made her mouth break out  . Statins Other (See Comments)    Caused elevated liver function studies (02/22/17-pt taking rosuvastatin without problems) Caused elevated liver function  studies  . Sulfa Antibiotics Other (See Comments)    Mouth blisters Welts on mouth   . Codeine Nausea And Vomiting  . Prednisone Palpitations and Other (See Comments)    Made her legs "feel weird."     Current Outpatient Medications:  .  albuterol (VENTOLIN HFA) 108 (90 Base) MCG/ACT inhaler, Inhale 2 puffs into the lungs every 4 (four) hours as needed for wheezing or shortness of breath., Disp: 48 g, Rfl: 3 .  aspirin (ASPIR-81) 81 MG EC tablet, Take 81 mg by mouth daily.  , Disp: , Rfl:  .  chlorhexidine (PERIDEX) 0.12 % solution, Use as directed 15 mLs in the mouth or throat 2 (two) times daily. (Patient taking differently: Use as directed 15 mLs in the mouth or throat 2 (two) times daily. ), Disp: 120 mL, Rfl: 6 .  clopidogrel (PLAVIX) 75 MG tablet, Take 1 tablet (75 mg total) by mouth daily., Disp: 90 tablet, Rfl: 3 .  ezetimibe (ZETIA) 10 MG tablet, Take 1 tablet (10 mg total) by mouth daily., Disp: 90 tablet, Rfl: 3 .  fluticasone furoate-vilanterol (BREO ELLIPTA) 100-25 MCG/INH AEPB, Inhale 1 puff into the lungs daily., Disp: 84 each, Rfl: 3 .  HYDROcodone-acetaminophen (NORCO/VICODIN) 5-325 MG tablet, Take 1 tablet by mouth every 6 (six) hours as needed., Disp: 120 tablet, Rfl: 0 .  insulin glargine (LANTUS) 100 UNIT/ML injection, Inject 15 Units into the skin daily. , Disp: , Rfl:  .  Liraglutide (VICTOZA) 18 MG/3ML SOLN injection, Inject 1.8 mg into the skin daily. , Disp: , Rfl:  .  lisinopril (PRINIVIL,ZESTRIL) 40 MG tablet, Take 1 tablet (40 mg total) by mouth daily., Disp: 90 tablet, Rfl: 3 .  meclizine (ANTIVERT) 25 MG tablet, Take by mouth as needed. , Disp: , Rfl:  .  metoprolol tartrate (LOPRESSOR) 25 MG tablet, Take 1 tablet (25 mg total) by mouth 2 (two) times daily., Disp: 180 tablet, Rfl: 3 .  montelukast (SINGULAIR) 10 MG tablet, Take 1 tablet (10 mg total) by mouth at bedtime. (Patient taking differently: Take 10 mg by mouth as needed. ), Disp: 30 tablet, Rfl: 12 .   Multiple Vitamins-Minerals (DAILY MULTI) TABS, Take 1 tablet by mouth daily. , Disp: , Rfl:  .  nitroGLYCERIN (NITROSTAT) 0.4 MG SL tablet, Place 1 tablet (0.4 mg total) under the tongue every 5 (five) minutes as needed., Disp: 25 tablet, Rfl: 6 .  Omega-3 Fatty Acids (FISH OIL) 1000 MG CAPS, Take 2 capsules by mouth 2 (two) times daily. Take 4, Disp: , Rfl:  .  ondansetron (ZOFRAN ODT) 4 MG disintegrating tablet, Take 1 tablet (4 mg total) by mouth every 8 (eight) hours as needed for nausea or vomiting., Disp: 20 tablet, Rfl: 0 .  RABEprazole (ACIPHEX) 20 MG tablet, Take 20 mg by mouth daily. , Disp: , Rfl:  .  rosuvastatin (CRESTOR) 5 MG tablet, Take 1 tablet (5 mg total) by mouth daily., Disp: 90 tablet, Rfl: 3 .  sucralfate (CARAFATE) 1 g tablet, Take 1 g by mouth 2 (two) times daily. , Disp: , Rfl:  .  tiotropium (SPIRIVA) 18 MCG inhalation capsule, Place 1 capsule (18 mcg total) into inhaler and inhale daily., Disp: 90 capsule, Rfl: 4 .  amLODipine (NORVASC) 5 MG tablet, Take 1 tablet (5 mg total) by mouth daily. (Patient not taking: Reported on 09/21/2017), Disp: 90 tablet, Rfl: 3 .  metFORMIN (GLUCOPHAGE) 1000 MG tablet, Take 1,000 mg by mouth 2 (two) times daily with a meal., Disp: , Rfl:   Review of Systems  Constitutional: Negative for appetite change, chills, fatigue and fever.  Respiratory: Negative for chest tightness and shortness of breath.   Cardiovascular: Negative for chest pain and palpitations.  Gastrointestinal: Negative for abdominal pain, nausea and vomiting.  Neurological: Negative for dizziness and weakness.    Social History   Tobacco Use  . Smoking status: Current Every Day Smoker    Packs/day: 0.75    Years: 40.00    Pack years: 30.00    Types: Cigarettes  . Smokeless tobacco: Never Used  Substance Use Topics  . Alcohol use: Yes    Comment: 1/month- wine   Objective:   BP (!) 157/88 (BP Location: Right Arm, Patient Position: Sitting, Cuff Size: Normal)    Pulse 82   Temp 98.4 F (36.9 C) (Oral)   Resp 16   Ht 5\' 5"  (1.651 m)   Wt 164 lb (74.4 kg)   SpO2 93%   BMI 27.29 kg/m  Vitals:   09/21/17 1115  BP: (!) 157/88  Pulse: 82  Resp: 16  Temp: 98.4 F (36.9 C)  TempSrc: Oral  SpO2: 93%  Weight: 164 lb (74.4 kg)  Height: 5\' 5"  (1.651 m)     Physical Exam   General Appearance:    Alert, cooperative, no distress  Eyes:    PERRL, conjunctiva/corneas clear, EOM's intact       ENT:     Slight frontal sinus tenderness and congestion of nasal turbinates.   Lungs:     Occasional expiratory wheeze, respirations unlabored  Heart:  Regular rate and rhythm  Neurologic:   Awake, alert, oriented x 3. No apparent focal neurological           defect.           Assessment & Plan:     1. Chronic obstructive pulmonary disease, unspecified COPD type ([place]) Doing well on current inhalers which are refilled today.  - tiotropium (SPIRIVA) 18 MCG inhalation capsule; Place 1 capsule (18 mcg total) into inhaler and inhale daily.  Dispense: 90 capsule; Refill: 4 - fluticasone furoate-vilanterol (BREO ELLIPTA) 100-25 MCG/INH AEPB; Inhale 1 puff into the lungs daily.  Dispense: 84 each; Refill: 3  2. Benign essential HTN Had been controlled with amlodipine, but since d/c due to cutaneous reaction. She would like to discuss alternative with Dr. [name] at appointment next month since BP is now elevated.   3. CAFL (chronic airflow limitation) ([facility]) refill- albuterol (VENTOLIN HFA) 108 (90 Base) MCG/ACT inhaler; Inhale 2 puffs into the lungs every 4 (four) hours as needed for wheezing or shortness of breath.  Dispense: 48 g; Refill: 3  4. Diabetes mellitus due to underlying condition with diabetic nephropathy, unspecified whether long term insulin use ([place]) Follow up endocrine next month as scheduled.   5. Chronic kidney disease, stage 3 ([facility]) Stable Continue current medications.    6. Acute sinusitis, recurrence not specified, unspecified  location  - cefdinir (OMNICEF) 300 MG capsule; Take 1 capsule (300 mg total) by mouth 2 (two) times daily for 7 days.  Dispense: 14 capsule; Refill: 0  7. Smoking Stop smoking, try- buPROPion (WELLBUTRIN SR) 150 MG 12 hr tablet; 1 tablet daily for 3 days, then 1 tablet twice daily. Stop smoking 14 days after starting medication  Dispense: 60 tablet; Refill: 5  Return in about 6 months (around 03/24/2018).       Lelon Huh, MD  [facility]

## 2017-10-05 ENCOUNTER — Encounter: Payer: Self-pay | Admitting: Family Medicine

## 2017-10-09 ENCOUNTER — Other Ambulatory Visit: Payer: Self-pay | Admitting: Family Medicine

## 2017-10-09 DIAGNOSIS — I10 Essential (primary) hypertension: Secondary | ICD-10-CM | POA: Diagnosis not present

## 2017-10-09 DIAGNOSIS — E1159 Type 2 diabetes mellitus with other circulatory complications: Secondary | ICD-10-CM | POA: Diagnosis not present

## 2017-10-09 DIAGNOSIS — E1121 Type 2 diabetes mellitus with diabetic nephropathy: Secondary | ICD-10-CM | POA: Diagnosis not present

## 2017-10-09 DIAGNOSIS — F17200 Nicotine dependence, unspecified, uncomplicated: Secondary | ICD-10-CM | POA: Diagnosis not present

## 2017-10-09 DIAGNOSIS — M48061 Spinal stenosis, lumbar region without neurogenic claudication: Secondary | ICD-10-CM

## 2017-10-09 DIAGNOSIS — E1169 Type 2 diabetes mellitus with other specified complication: Secondary | ICD-10-CM | POA: Diagnosis not present

## 2017-10-09 DIAGNOSIS — Z794 Long term (current) use of insulin: Secondary | ICD-10-CM | POA: Diagnosis not present

## 2017-10-09 DIAGNOSIS — E785 Hyperlipidemia, unspecified: Secondary | ICD-10-CM | POA: Diagnosis not present

## 2017-10-09 LAB — HEMOGLOBIN A1C: HEMOGLOBIN A1C: 6.9

## 2017-10-09 MED ORDER — HYDROCODONE-ACETAMINOPHEN 5-325 MG PO TABS: 1.0000 | ORAL_TABLET | Freq: Four times a day (QID) | ORAL | 0 refills | Status: DC | PRN

## 2017-10-17 ENCOUNTER — Emergency Department
Admission: EM | Admit: 2017-10-17 | Discharge: 2017-10-17 | Disposition: A | Discharge status: Home / Self Care | Payer: Medicare Other | Attending: Emergency Medicine | Admitting: Emergency Medicine

## 2017-10-17 ENCOUNTER — Other Ambulatory Visit: Payer: Self-pay

## 2017-10-17 ENCOUNTER — Emergency Department: Payer: Medicare Other

## 2017-10-17 DIAGNOSIS — E1122 Type 2 diabetes mellitus with diabetic chronic kidney disease: Secondary | ICD-10-CM | POA: Insufficient documentation

## 2017-10-17 DIAGNOSIS — I129 Hypertensive chronic kidney disease with stage 1 through stage 4 chronic kidney disease, or unspecified chronic kidney disease: Secondary | ICD-10-CM | POA: Diagnosis not present

## 2017-10-17 DIAGNOSIS — Z7902 Long term (current) use of antithrombotics/antiplatelets: Secondary | ICD-10-CM | POA: Diagnosis not present

## 2017-10-17 DIAGNOSIS — R1030 Lower abdominal pain, unspecified: Secondary | ICD-10-CM | POA: Diagnosis not present

## 2017-10-17 DIAGNOSIS — R1010 Upper abdominal pain, unspecified: Secondary | ICD-10-CM | POA: Diagnosis not present

## 2017-10-17 DIAGNOSIS — Z794 Long term (current) use of insulin: Secondary | ICD-10-CM | POA: Insufficient documentation

## 2017-10-17 DIAGNOSIS — Z7982 Long term (current) use of aspirin: Secondary | ICD-10-CM | POA: Insufficient documentation

## 2017-10-17 DIAGNOSIS — N183 Chronic kidney disease, stage 3 (moderate): Secondary | ICD-10-CM | POA: Diagnosis not present

## 2017-10-17 DIAGNOSIS — R109 Unspecified abdominal pain: Secondary | ICD-10-CM | POA: Diagnosis not present

## 2017-10-17 DIAGNOSIS — Z79899 Other long term (current) drug therapy: Secondary | ICD-10-CM | POA: Diagnosis not present

## 2017-10-17 DIAGNOSIS — F17210 Nicotine dependence, cigarettes, uncomplicated: Secondary | ICD-10-CM | POA: Insufficient documentation

## 2017-10-17 LAB — COMPREHENSIVE METABOLIC PANEL
ALK PHOS: 135 U/L — AB (ref 38–126)
ALT: 25 U/L (ref 0–44)
AST: 48 U/L — AB (ref 15–41)
Albumin: 3.9 g/dL (ref 3.5–5.0)
Anion gap: 10 (ref 5–15)
BUN: 28 mg/dL — AB (ref 8–23)
CHLORIDE: 103 mmol/L (ref 98–111)
CO2: 25 mmol/L (ref 22–32)
CREATININE: 1.44 mg/dL — AB (ref 0.44–1.00)
Calcium: 10.1 mg/dL (ref 8.9–10.3)
GFR calc Af Amer: 41 mL/min — ABNORMAL LOW (ref >60)
GFR, EST NON AFRICAN AMERICAN: 36 mL/min — AB (ref >60)
Glucose, Bld: 136 mg/dL — ABNORMAL HIGH (ref 70–99)
Potassium: 3.7 mmol/L (ref 3.5–5.1)
Sodium: 138 mmol/L (ref 135–145)
Total Bilirubin: 1.1 mg/dL (ref 0.3–1.2)
Total Protein: 7.3 g/dL (ref 6.5–8.1)

## 2017-10-17 LAB — CBC WITH DIFFERENTIAL/PLATELET
Basophils Absolute: 0.1 10*3/uL (ref 0–0.1)
Basophils Relative: 1 %
EOS PCT: 1 %
Eosinophils Absolute: 0.1 10*3/uL (ref 0–0.7)
HCT: 50.8 % — ABNORMAL HIGH (ref 35.0–47.0)
Hemoglobin: 17.2 g/dL — ABNORMAL HIGH (ref 12.0–16.0)
LYMPHS ABS: 2.2 10*3/uL (ref 1.0–3.6)
LYMPHS PCT: 16 %
MCH: 32 pg (ref 26.0–34.0)
MCHC: 34 g/dL (ref 32.0–36.0)
MCV: 94.3 fL (ref 80.0–100.0)
MONO ABS: 0.5 10*3/uL (ref 0.2–0.9)
Monocytes Relative: 4 %
Neutro Abs: 11.1 10*3/uL — ABNORMAL HIGH (ref 1.4–6.5)
Neutrophils Relative %: 78 %
Platelets: 223 10*3/uL (ref 150–440)
RBC: 5.39 MIL/uL — ABNORMAL HIGH (ref 3.80–5.20)
RDW: 14.3 % (ref 11.5–14.5)
WBC: 13.9 10*3/uL — ABNORMAL HIGH (ref 3.6–11.0)

## 2017-10-17 LAB — LIPASE, BLOOD: LIPASE: 43 U/L (ref 11–51)

## 2017-10-17 MED ORDER — ONDANSETRON HCL 4 MG/2ML IJ SOLN
4.0000 mg | Freq: Once | INTRAMUSCULAR | Status: AC
  Administered 2017-10-17: 4 mg via INTRAVENOUS
  Filled 2017-10-17: qty 2

## 2017-10-17 MED ORDER — MORPHINE SULFATE (PF) 2 MG/ML IV SOLN
2.0000 mg | Freq: Once | INTRAVENOUS | Status: AC
  Administered 2017-10-17: 2 mg via INTRAVENOUS
  Filled 2017-10-17: qty 1

## 2017-10-17 MED ORDER — IOPAMIDOL (ISOVUE-300) INJECTION 61%: 30.0000 mL | Freq: Once | INTRAVENOUS | Status: DC | PRN

## 2017-10-17 NOTE — ED Notes (Signed)
Pt is informed that she needs to drink the oral contrast for CT.

## 2017-10-17 NOTE — ED Notes (Signed)
Pt presented today from home for bilateral lower abdominal pain that started at 2 pm this afternoon. +nausea.

## 2017-10-17 NOTE — ED Notes (Signed)
Pt refusing additional IV access at this time. She does not want to CT with contrast due to her hx of kidney disease.

## 2017-10-17 NOTE — ED Notes (Signed)
Provider made aware of oral contrast and stated that was ok. Called CT and informed them that pt was finished with oral contrast.

## 2017-10-17 NOTE — ED Notes (Signed)
Pt on commode at this time having diarrhea. Pt has had large BM.

## 2017-10-17 NOTE — Discharge Instructions (Signed)
Please return at once for worse abdominal pain, fever, vomiting or feeling sicker.  Please have your doctor check on your next day or 2.

## 2017-10-17 NOTE — ED Notes (Signed)
E-sign is not working at this time. Pt stated verbal understanding or discharge instructions and follow up care.

## 2017-10-17 NOTE — ED Provider Notes (Signed)
[facility] Emergency Department Provider Note   ____________________________________________   First MD Initiated Contact with Patient 10/17/17 1820     (approximate)  I have reviewed the triage vital signs and the nursing notes.   HISTORY  Chief Complaint Abdominal Pain   HPI ANALEAH BRAME is a 72 y.o. female patient reports no stool for 3 or 4 days and severe lower mid abdominal pain is worse with movement worse with palpation.  She is somewhat nauseated.   Past Medical History:  Diagnosis Date  . Anemia   . Arthritis   . CAD (coronary artery disease)    a. cath 02/2006: BMS x 2 to RCA, cath o/w without significant coronary disease; b. nuclear stress test 07/2014: no signs of ischemia, no ekg changes concerning for ischemia, low risk study/normal study  . Chronic bronchitis ([place])    secondary to cigarette smoking  . Chronic kidney disease (CKD), stage III (moderate) (HCC)   . COPD (chronic obstructive pulmonary disease) ([place])   . Diabetes mellitus   . FHx: allergies   . GERD (gastroesophageal reflux disease)   . Goiter   . 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 Hernia   . Hyperlipidemia   . Hypertension   . Kidney stone on left side 2013  . Microalbuminuria   . Obesity   . Smokers' cough ([place])   . Spinal stenosis   . Stroke ([place]) 10/29/2016   mild left side weakness    Patient Active Problem List   Diagnosis Date Noted  . Stroke ([place]) 11/08/2016  . Weakness of left upper extremity 10/30/2016  . AAA (abdominal aortic aneurysm) without rupture ([place]) 08/12/2016  . 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 Chronic kidney disease, stage 3 ([place]) 07/27/2015  . Chest pain at rest 05/06/2015  . 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[place]) 10/28/2014  . Solitary pulmonary nodule on lung CT 08/20/2014  . Allergic rhinitis 07/28/2014  . CAFL (chronic airflow limitation) ([place]) 07/28/2014  . Abnormal liver enzymes 07/28/2014  . Acid reflux 07/28/2014    . Head revolving around 07/28/2014  . CAD (coronary artery disease)   . Obesity   . Granuloma annulare   . Back pain 11/12/2013  . Scoliosis 10/30/2013  . Lumbar radiculopathy 10/30/2013  . Lumbar canal stenosis 10/30/2013  . Calcium blood increased 10/22/2013  . Microalbuminuria 10/22/2013  . Smoking 03/21/2011  . Obese 03/21/2011  . Edema 08/18/2010  . Hyperlipidemia 03/25/2009  . Benign essential HTN 03/25/2009    Past Surgical History:  Procedure Laterality Date  . ABDOMINAL HYSTERECTOMY    . BREAST BIOPSY    . CATARACT EXTRACTION W/PHACO Right 03/01/2017   Procedure: CATARACT EXTRACTION PHACO AND INTRAOCULAR LENS PLACEMENT ([place]) RIGHT DIABETIC;  Surgeon: Leandrew Koyanagi, MD;  Location: [place];  Service: Ophthalmology;  Laterality: Right;  . CATARACT EXTRACTION W/PHACO Left 03/22/2017   Procedure: CATARACT EXTRACTION PHACO AND INTRAOCULAR LENS PLACEMENT ([place]) LEFT DIABETIC;  Surgeon: Leandrew Koyanagi, MD;  Location: [place];  Service: Ophthalmology;  Laterality: Left;  Diabetic - insulin and oral meds  . COLONOSCOPY WITH PROPOFOL N/A 09/23/2014   Procedure: COLONOSCOPY WITH PROPOFOL;  Surgeon: Lollie Sails, MD;  Location: [facility] ENDOSCOPY;  Service: Endoscopy;  Laterality: N/A;  . CORONARY ANGIOPLASTY WITH STENT PLACEMENT  2008  . ESOPHAGOGASTRODUODENOSCOPY (EGD) WITH PROPOFOL N/A 12/30/2014   Procedure: ESOPHAGOGASTRODUODENOSCOPY (EGD) WITH PROPOFOL;  Surgeon: Lollie Sails, MD;  Location: [facility] ENDOSCOPY;  Service: Endoscopy;  Laterality: N/A;  . ESOPHAGOGASTRODUODENOSCOPY (EGD)  WITH PROPOFOL N/A 07/19/2016   Procedure: ESOPHAGOGASTRODUODENOSCOPY (EGD) WITH PROPOFOL;  Surgeon: Lollie Sails, MD;  Location: [facility] ENDOSCOPY;  Service: Endoscopy;  Laterality: N/A;  . hysterectomy (other)      Prior to Admission medications   Medication Sig Start Date End Date Taking? Authorizing Provider  albuterol (VENTOLIN HFA) 108 (90 Base) MCG/ACT  inhaler Inhale 2 puffs into the lungs every 4 (four) hours as needed for wheezing or shortness of breath. 09/21/17   Birdie Sons, MD  amLODipine (NORVASC) 5 MG tablet Take 1 tablet (5 mg total) by mouth daily. Patient not taking: Reported on 09/21/2017 11/08/16   Minna Merritts, MD  aspirin (ASPIR-81) 81 MG EC tablet Take 81 mg by mouth daily.      [provider]  buPROPion (WELLBUTRIN SR) 150 MG 12 hr tablet 1 tablet daily for 3 days, then 1 tablet twice daily. Stop smoking 14 days after starting medication 09/21/17 01/19/18  Birdie Sons, MD  chlorhexidine (PERIDEX) 0.12 % solution Use as directed 15 mLs in the mouth or throat 2 (two) times daily. Patient taking differently: Use as directed 15 mLs in the mouth or throat 2 (two) times daily.  11/08/16   Birdie Sons, MD  clopidogrel (PLAVIX) 75 MG tablet Take 1 tablet (75 mg total) by mouth daily. 11/08/16   Minna Merritts, MD  ezetimibe (ZETIA) 10 MG tablet Take 1 tablet (10 mg total) by mouth daily. 11/08/16   Minna Merritts, MD  fluticasone furoate-vilanterol (BREO ELLIPTA) 100-25 MCG/INH AEPB Inhale 1 puff into the lungs daily. 09/21/17   Birdie Sons, MD  HYDROcodone-acetaminophen (NORCO/VICODIN) 5-325 MG tablet Take 1 tablet by mouth every 6 (six) hours as needed. 10/09/17   Birdie Sons, MD  insulin glargine (LANTUS) 100 UNIT/ML injection Inject 15 Units into the skin daily.     [provider]  Liraglutide (VICTOZA) 18 MG/3ML SOLN injection Inject 1.8 mg into the skin daily.     [provider]  lisinopril (PRINIVIL,ZESTRIL) 40 MG tablet Take 1 tablet (40 mg total) by mouth daily. 11/08/16   Minna Merritts, MD  meclizine (ANTIVERT) 25 MG tablet Take by mouth as needed.  07/06/11   [provider]  metFORMIN (GLUCOPHAGE) 1000 MG tablet Take 1,000 mg by mouth 2 (two) times daily with a meal.    [provider]  metoprolol tartrate (LOPRESSOR) 25 MG tablet Take 1 tablet (25 mg  total) by mouth 2 (two) times daily. 11/08/16   Minna Merritts, MD  montelukast (SINGULAIR) 10 MG tablet Take 1 tablet (10 mg total) by mouth at bedtime. Patient taking differently: Take 10 mg by mouth as needed.  04/20/16 10/29/17  Birdie Sons, MD  Multiple Vitamins-Minerals (DAILY MULTI) TABS Take 1 tablet by mouth daily.     [provider]  nitroGLYCERIN (NITROSTAT) 0.4 MG SL tablet Place 1 tablet (0.4 mg total) under the tongue every 5 (five) minutes as needed. 05/06/15   Minna Merritts, MD  Omega-3 Fatty Acids (FISH OIL) 1000 MG CAPS Take 2 capsules by mouth 2 (two) times daily. Take 4    [provider]  ondansetron (ZOFRAN ODT) 4 MG disintegrating tablet Take 1 tablet (4 mg total) by mouth every 8 (eight) hours as needed for nausea or vomiting. 08/06/16   Alfred Levins, [place], MD  RABEprazole (ACIPHEX) 20 MG tablet Take 20 mg by mouth daily.  12/30/14 10/29/17  [provider]  rosuvastatin (CRESTOR) 5  MG tablet Take 1 tablet (5 mg total) by mouth daily. 11/08/16   Minna Merritts, MD  sucralfate (CARAFATE) 1 g tablet Take 1 g by mouth 2 (two) times daily.  12/30/14 10/29/17  [provider]  tiotropium (SPIRIVA) 18 MCG inhalation capsule Place 1 capsule (18 mcg total) into inhaler and inhale daily. 09/21/17   Birdie Sons, MD    Allergies Sulfonamide derivatives; Amlodipine; Statins; Sulfa antibiotics; Codeine; and Prednisone  Family History  Problem Relation Age of Onset  . Heart failure Mother   . Stroke Mother   . Diabetes Mother   . Congestive Heart Failure Mother   . Lung cancer Father   . Breast cancer Maternal Aunt     Social History Social History   Tobacco Use  . Smoking status: Current Every Day Smoker    Packs/day: 0.75    Years: 40.00    Pack years: 30.00    Types: Cigarettes  . Smokeless tobacco: Never Used  Substance Use Topics  . Alcohol use: Yes    Comment: 1/month- wine  . Drug use: No    Review of  Systems  Constitutional: No fever/chills Eyes: No visual changes. ENT: No sore throat. Cardiovascular: Denies chest pain. Respiratory: Denies shortness of breath. Gastrointestinal:  abdominal pain.   nausea, no vomiting.  No diarrhea.  No constipation. Genitourinary: Negative for dysuria. Musculoskeletal: Negative for back pain. Skin: Negative for rash. Neurological: Negative for headaches, focal weakness   ____________________________________________   PHYSICAL EXAM:  VITAL SIGNS: ED Triage Vitals  Enc Vitals Group     BP 10/17/17 1822 (!) 154/86     Pulse Rate 10/17/17 1822 85     Resp 10/17/17 1822 20     Temp 10/17/17 1822 98.5 F (36.9 C)     Temp src --      SpO2 10/17/17 1822 90 %     Weight 10/17/17 1826 164 lb 0.4 oz (74.4 kg)     Height 10/17/17 1826 5\' 5"  (1.651 m)     Head Circumference --      Peak Flow --      Pain Score 10/17/17 1825 10     Pain Loc --      Pain Edu? --      Excl. in [place]? --     Constitutional: Alert and oriented. Well appearing and in no acute distress. Eyes: Conjunctivae are normal. PERRL. EOMI. Head: Atraumatic. Nose: No congestion/rhinnorhea. Mouth/Throat: Mucous membranes are moist.  Oropharynx non-erythematous. Neck: No stridor.   Cardiovascular: Normal rate, regular rhythm. Grossly normal heart sounds.  Good peripheral circulation. Respiratory: Normal respiratory effort.  No retractions. Lungs CTAB. Gastrointestinal: Soft tender to palpation percussion in the lower abdomen midline no distention. No abdominal bruits. No CVA tenderness. Musculoskeletal: No lower extremity tenderness nor edema.  No joint effusions. Neurologic:  Normal speech and language. No gross focal neurologic deficits are appreciated. No gait instability. Skin:  Skin is warm, dry and intact. No rash noted.   ____________________________________________   LABS (all labs ordered are listed, but only abnormal results are displayed)  Labs Reviewed   COMPREHENSIVE METABOLIC PANEL - Abnormal; Notable for the following components:      Result Value   Glucose, Bld 136 (*)    BUN 28 (*)    Creatinine, Ser 1.44 (*)    AST 48 (*)    Alkaline Phosphatase 135 (*)    GFR calc non Af Amer 36 (*)    GFR calc Af  Amer 41 (*)    All other components within normal limits  CBC WITH DIFFERENTIAL/PLATELET - Abnormal; Notable for the following components:   WBC 13.9 (*)    RBC 5.39 (*)    Hemoglobin 17.2 (*)    HCT 50.8 (*)    Neutro Abs 11.1 (*)    All other components within normal limits  GASTROINTESTINAL PANEL BY PCR, STOOL (REPLACES STOOL CULTURE)  C DIFFICILE QUICK SCREEN W PCR REFLEX  LIPASE, BLOOD  URINALYSIS, COMPLETE (UACMP) WITH MICROSCOPIC   ____________________________________________  EKG   ____________________________________________  RADIOLOGY  ED MD interpretation: CT read as colitis versus ischemia although colitis is preferred.  Official radiology report(s): Ct Abdomen Pelvis Wo Contrast  Result Date: 10/17/2017 CLINICAL DATA:  Abdominal pain for several hours EXAM: CT ABDOMEN AND PELVIS WITHOUT CONTRAST TECHNIQUE: Multidetector CT imaging of the abdomen and pelvis was performed following the standard protocol without IV contrast. COMPARISON:  08/06/2016 FINDINGS: Lower chest: Lung bases are free of acute infiltrate or sizable effusion. Hepatobiliary: Liver is within normal limits. Gallbladder is well distended with tiny dependent gallstones. Pancreas: Unremarkable. No pancreatic ductal dilatation or surrounding inflammatory changes. Spleen: Normal in size without focal abnormality. Adrenals/Urinary Tract: Adrenal glands are within normal limits. Kidneys are well visualized bilaterally. A few scattered hypodensities are noted within the kidneys consistent with renal cysts. These are stable from the previous exam. Tiny nonobstructing renal stone is noted in the midportion of the left kidney stable from the prior exam. No  ureteral stones are seen. The bladder is well distended. Stomach/Bowel: Diverticular change of the colon is noted. Diffuse colonic wall thickening is noted in the mid to distal transverse colon and extending into the proximal descending colon. These changes likely represent an inflammatory colitis although given its location the possibility of focal ischemia would deserve consideration as well. No evidence of perforation is seen. No obstructive changes are noted. Appendix is within normal limits. Vascular/Lymphatic: Aortic atherosclerosis. No enlarged abdominal or pelvic lymph nodes. Reproductive: Status post hysterectomy. No adnexal masses. Other: No abdominal wall hernia or abnormality. No abdominopelvic ascites. Musculoskeletal: Degenerative changes of lumbar spine are noted. IMPRESSION: Diffuse wall thickening within the distal transverse colon and proximal descending colon consistent with colitis. It is uncertain whether this represents an infectious or ischemic colitis although infectious is favored given the appearance of the colon. Electronically Signed   By: Inez Catalina M.D.   On: 10/17/2017 20:58    ____________________________________________   PROCEDURES  Procedure(s) performed:   Procedures  Critical Care performed:  ____________________________________________   INITIAL IMPRESSION / ASSESSMENT AND PLAN / ED COURSE  CT scan shows colitis.  They say could be colitis or ischemia.  Patient herself had one large diarrhea and now feels much better.  Her belly is slightly tender now but much less tender than it was she overall feels better is no longer moaning in pain I can push fairly deeply into her belly without any problems.  I discussed with her the fact that her kidney function is off slightly and is slightly worse than it was previously.  If the CAT scan shows colitis or possibly ischemia and that her white blood count is somewhat elevated and the safest thing to do would be to come  in the hospital overnight.  She refuses and wants to go home.  She is aware of the risks of worsening or possibly even death.  She promises to come back if she is worse at all.  Her husband  agrees to bring her back immediately if she gets worse.  Because of this I will let them go.  Although as I told them both its under protest from me.        ____________________________________________   FINAL CLINICAL IMPRESSION(S) / ED DIAGNOSES  Final diagnoses:  Abdominal pain, unspecified abdominal location     ED Discharge Orders    None       Note:  This document was prepared using Dragon voice recognition software and may include unintentional dictation errors.    Nena Polio, MD 10/17/17 2112

## 2017-10-17 NOTE — ED Notes (Signed)
Pt has about 150 ml of oral contrast left and she stated that she would not drink anymore.

## 2017-10-19 ENCOUNTER — Ambulatory Visit: Admission: RE | Admit: 2017-10-19 | Payer: Medicare Other | Source: Ambulatory Visit | Admitting: Gastroenterology

## 2017-10-19 ENCOUNTER — Encounter: Admission: RE | Payer: Self-pay | Source: Ambulatory Visit

## 2017-10-19 SURGERY — COLONOSCOPY WITH PROPOFOL
Anesthesia: General

## 2017-11-07 ENCOUNTER — Encounter: Payer: Self-pay | Admitting: Family Medicine

## 2017-11-07 DIAGNOSIS — M48061 Spinal stenosis, lumbar region without neurogenic claudication: Secondary | ICD-10-CM

## 2017-11-07 MED ORDER — HYDROCODONE-ACETAMINOPHEN 5-325 MG PO TABS: 1.0000 | ORAL_TABLET | Freq: Four times a day (QID) | ORAL | 0 refills | Status: DC | PRN

## 2017-11-20 NOTE — Progress Notes (Signed)
Patient at 2:00 going to cardiology Office Note  Date:  11/22/2017   ID:  Hannah Kirby, DOB 05-26-1945, MRN [id number]  PCP:  Hannah Sons, MD   Chief Complaint  Patient presents with  . other    6 month f/u c/o sob and chest pain. Meds reviewed verbally with pt.    HPI:  [name] is a 72 year-old woman with a history of  diabetes,  obesity,  Anxiety, panic attacks significantly improved with  weight loss on Victoza,  coronary artery disease with stenting of her RCA in January 2008,  long smoking history who continues to smoke,  hyperlipidemia  outbreak of her granulomatous disease in summary 2013, prednisone for this condition.  History pinched nerve,  on pain medication. who presents for routine followup of her coronary artery disease.  In the ER 09/2017 ABD pain, nausea Hospital records reviewed with the patient in detail constipated Constipation is a chronic issue and she is only taking Colace  Lab work discussed with her from the hospital Chronically elevated WBC Elevated WBC 07/2016,  Skin issue, granuloma started around that time, pus be related  Denies any chest pain, near syncope syncope No regular exercise program Chronic shortness of breath Continues to smoke [place]ent time at the beach with family   previous strokes on MRI Discussed on previous office visit  Pressure running high at home since she stopped amlodipine She was concerned amlodipine was causing some of her skin condition but it has not changed anything  EKG personally reviewed by myself on todays visit Shows normal sinus rhythm rate 76 bpm  T wave abnormality V4 through V6, 1 and aVL More prominent than prior EKG showing only 1 and aVL T wave abnormality  Prior stress test June 2016  Other past medical history reviewed hospital with stroke 10/30/2016 Left upper extremity weakness on coming back from the beach while in the car She was on aspirin and Plavix at the  time Echocardiogram normal ejection fractionno significant valve disease, moderate LVH, diastolic dysfunction  MRI brain: Small acute right basal ganglia region infarct. Prior strokes MRA: negative. Carotid duplex showed bilateral 0-49% stenosis.  ER 06/17/16 with HTN Hospital records reviewed with the patient in detail Started amlodipine 2.5 daily Given ativan Per the patient  Back to the ER several days later for high blood pressure  Amlodipine increased up to 5 mg daily  she has continued to have high pressures   anxiety symptoms recently, unclear why She reports panic attacks in January, claustrophobic in the house   No recent upper respiratory symptoms Poor diet recently, eating pastries   She is currently on albvuterol and spirivia  On victoza,   does not drink much fluids Continues to smoke one pack per day Denies having any significant chest pain on exertion  History of GERD and Barrett's esophagus per the patient. Takes omeprazole Chronic back pain  stress test for chest pain 07/29/2014 that showed no ischemia chronicproblems with her teeth  episode of Dehydration after diarrhea mid September 2017 Starting having rectal bleeding Stopped her Plavix for several days with improvement of her symptoms   working with Dr. Eddie Dibbles on her diabetes.    PMH:   has a past medical history of Anemia, Arthritis, CAD (coronary artery disease), Chronic bronchitis ([place]), Chronic kidney disease (CKD), stage III (moderate) ([place]), COPD (chronic obstructive pulmonary disease) ([place]), Diabetes mellitus, FHx: allergies, GERD (gastroesophageal reflux disease), Goiter, Granulomatous disease ([place]), Hernia, Hyperlipidemia, Hypertension, Kidney stone on  left side (2013), Microalbuminuria, Obesity, Smokers' cough ([place]), Spinal stenosis, and Stroke ([place]) (10/29/2016).  PSH:    Past Surgical History:  Procedure Laterality Date  . ABDOMINAL HYSTERECTOMY    . BREAST BIOPSY    . CATARACT  EXTRACTION W/PHACO Right 03/01/2017   Procedure: CATARACT EXTRACTION PHACO AND INTRAOCULAR LENS PLACEMENT ([place]) RIGHT DIABETIC;  Surgeon: Leandrew Koyanagi, MD;  Location: [facility];  Service: Ophthalmology;  Laterality: Right;  . CATARACT EXTRACTION W/PHACO Left 03/22/2017   Procedure: CATARACT EXTRACTION PHACO AND INTRAOCULAR LENS PLACEMENT ([place]) LEFT DIABETIC;  Surgeon: Leandrew Koyanagi, MD;  Location: [facility];  Service: Ophthalmology;  Laterality: Left;  Diabetic - insulin and oral meds  . COLONOSCOPY WITH PROPOFOL N/A 09/23/2014   Procedure: COLONOSCOPY WITH PROPOFOL;  Surgeon: Lollie Sails, MD;  Location[facility];  Service: Endoscopy;  Laterality: N/A;  . CORONARY ANGIOPLASTY WITH STENT PLACEMENT  2008  . ESOPHAGOGASTRODUODENOSCOPY (EGD) WITH PROPOFOL N/A 12/30/2014   Procedure: ESOPHAGOGASTRODUODENOSCOPY (EGD) WITH PROPOFOL;  Surgeon: Lollie Sails, MD;  Location: [facility] ENDOSCOPY;  Service: Endoscopy;  Laterality: N/A;  . ESOPHAGOGASTRODUODENOSCOPY (EGD) WITH PROPOFOL N/A 07/19/2016   Procedure: ESOPHAGOGASTRODUODENOSCOPY (EGD) WITH PROPOFOL;  Surgeon: Lollie Sails, MD;  Location: [facility] ENDOSCOPY;  Service: Endoscopy;  Laterality: N/A;  . hysterectomy (other)      Current Outpatient Medications  Medication Sig Dispense Refill  . albuterol (VENTOLIN HFA) 108 (90 Base) MCG/ACT inhaler Inhale 2 puffs into the lungs every 4 (four) hours as needed for wheezing or shortness of breath. 48 g 3  . aspirin (ASPIR-81) 81 MG EC tablet Take 81 mg by mouth daily.      . chlorhexidine (PERIDEX) 0.12 % solution Use as directed 15 mLs in the mouth or throat 2 (two) times daily. (Patient taking differently: Use as directed 15 mLs in the mouth or throat 2 (two) times daily. ) 120 mL 6  . clopidogrel (PLAVIX) 75 MG tablet Take 1 tablet (75 mg total) by mouth daily. 90 tablet 3  . ezetimibe (ZETIA) 10 MG tablet Take 1 tablet (10 mg total) by mouth daily. 90 tablet 3   . fluticasone furoate-vilanterol (BREO ELLIPTA) 100-25 MCG/INH AEPB Inhale 1 puff into the lungs daily. 84 each 3  . HYDROcodone-acetaminophen (NORCO/VICODIN) 5-325 MG tablet Take 1 tablet by mouth every 6 (six) hours as needed. 120 tablet 0  . insulin glargine (LANTUS) 100 UNIT/ML injection Inject 15 Units into the skin daily.     . Liraglutide (VICTOZA) 18 MG/3ML SOLN injection Inject 1.8 mg into the skin daily.     Marland Kitchen lisinopril (PRINIVIL,ZESTRIL) 40 MG tablet Take 1 tablet (40 mg total) by mouth daily. 90 tablet 3  . meclizine (ANTIVERT) 25 MG tablet Take by mouth as needed.     . metoprolol tartrate (LOPRESSOR) 25 MG tablet Take 1 tablet (25 mg total) by mouth 2 (two) times daily. 180 tablet 3  . montelukast (SINGULAIR) 10 MG tablet Take 1 tablet (10 mg total) by mouth at bedtime. (Patient taking differently: Take 10 mg by mouth as needed. ) 30 tablet 12  . Multiple Vitamins-Minerals (DAILY MULTI) TABS Take 1 tablet by mouth daily.     . nitroGLYCERIN (NITROSTAT) 0.4 MG SL tablet Place 1 tablet (0.4 mg total) under the tongue every 5 (five) minutes as needed. 25 tablet 6  . Omega-3 Fatty Acids (FISH OIL) 1000 MG CAPS Take 2 capsules by mouth 2 (two) times daily. Take 4    . ondansetron (ZOFRAN ODT) 4  MG disintegrating tablet Take 1 tablet (4 mg total) by mouth every 8 (eight) hours as needed for nausea or vomiting. 20 tablet 0  . rosuvastatin (CRESTOR) 5 MG tablet Take 1 tablet (5 mg total) by mouth daily. 90 tablet 3  . tiotropium (SPIRIVA) 18 MCG inhalation capsule Place 1 capsule (18 mcg total) into inhaler and inhale daily. 90 capsule 4  . buPROPion (WELLBUTRIN SR) 150 MG 12 hr tablet 1 tablet daily for 3 days, then 1 tablet twice daily. Stop smoking 14 days after starting medication (Patient not taking: Reported on 11/22/2017) 60 tablet 5  . RABEprazole (ACIPHEX) 20 MG tablet Take 20 mg by mouth daily.     . sucralfate (CARAFATE) 1 g tablet Take 1 g by mouth 2 (two) times daily.      No  current facility-administered medications for this visit.      Allergies:   Sulfonamide derivatives; Amlodipine; Statins; Sulfa antibiotics; Codeine; and Prednisone   Social History:  The patient  reports that she has been smoking cigarettes. She has a 30.00 pack-year smoking history. She has never used smokeless tobacco. She reports that she drinks alcohol. She reports that she does not use drugs.   Family History:   family history includes Breast cancer in her maternal aunt; Congestive Heart Failure in her mother; Diabetes in her mother; Heart failure in her mother; Lung cancer in her father; Stroke in her mother.    Review of Systems: Review of Systems  Constitutional: Negative.   Respiratory: Negative.   Cardiovascular: Negative.   Gastrointestinal: Negative.   Musculoskeletal: Positive for back pain.  Neurological: Negative.        Left hand weakness  Psychiatric/Behavioral: Negative.   All other systems reviewed and are negative.    PHYSICAL EXAM: VS:  BP (!) 134/98 (BP Location: Left Arm, Patient Position: Sitting, Cuff Size: Normal)   Pulse 76   Ht 5\' 5"  (1.651 m)   Wt 165 lb 8 oz (75.1 kg)   BMI 27.54 kg/m  , BMI Body mass index is 27.54 kg/m.  No significant change in exam Constitutional:  oriented to person, place, and time. No distress.  HENT:  Head: Normocephalic and atraumatic.  Eyes:  no discharge. No scleral icterus.  Neck: Normal range of motion. Neck supple. No JVD present.  Cardiovascular: Normal rate, regular rhythm, normal heart sounds and intact distal pulses. Exam reveals no gallop and no friction rub. No edema No murmur heard. Pulmonary/Chest: Effort normal and breath sounds normal. No stridor. No respiratory distress.  no wheezes.  no rales.  no tenderness.  Abdominal: Soft.  no distension.  no tenderness.  Musculoskeletal: Normal range of motion.  no  tenderness or deformity.  Neurological:  normal muscle tone. Coordination normal. No  atrophy Skin: Skin is warm and dry. No rash noted. not diaphoretic.  Psychiatric:  normal mood and affect. behavior is normal. Thought content normal.    Recent Labs: 10/17/2017: ALT 25; BUN 28; Creatinine, Ser 1.44; Hemoglobin 17.2; Platelets 223; Potassium 3.7; Sodium 138    Lipid Panel Lab Results  Component Value Date   CHOL 129 10/30/2016   HDL 52 10/30/2016   LDLCALC 53 10/30/2016   TRIG 118 10/30/2016      Wt Readings from Last 3 Encounters:  11/22/17 165 lb 8 oz (75.1 kg)  10/17/17 164 lb 0.4 oz (74.4 kg)  09/21/17 164 lb (74.4 kg)       ASSESSMENT AND PLAN:  Anxiety  prior history of  panic attacks Insomnia, able recently  Hyperlipidemia, unspecified hyperlipidemia type - Cholesterol is at goal on the current lipid regimen. No changes to the medications were made.  Repeat lipid panel when she sees primary care  Coronary artery disease involving native coronary artery of native heart without angina pectoris -  New EKG changes, T waves in V4 through V6 Were not noted before Chronic shortness of breath, recent hospital admission for abdominal pain chest pain Discussed various treatment options with her, Will order pharmacologic Myoview as she is unable to treadmill  CVA Small vessel disease, continues to smoke, diabetes, hyperlipidemia  Previous stroke seen on MRA as well as acute stroke small vessel disease likely contributor to her stroke Recommended smoking cessation No new TIA or stroke  Benign essential HTN -  Stopped amlodipine out of concern of dermatologic issues Blood pressure running high and given previous strokes we will start kidney friendly medication such as Cardura 1 mg daily She has dry mouth will avoid clonidine  Chest pain at rest -  Abnormal EKG as above Known coronary disease Stress test ordered  Smoker  Discussed with her again the need to stop smoking given recent stroke  Still having difficulty  Chronic renal insufficiency   Likely secondary to Long-standing diabetes Creatinine 1.6, stable Followed by nephrology  Disposition:   F/U  12 months   Total encounter time more than 25 minutes  Greater than 50% was spent in counseling and coordination of care with the patient  No orders of the defined types were placed in this encounter.    Signed, Esmond Plants, M.D., Ph.D. 11/22/2017  [place], [place]

## 2017-11-21 DIAGNOSIS — I639 Cerebral infarction, unspecified: Secondary | ICD-10-CM | POA: Diagnosis not present

## 2017-11-21 DIAGNOSIS — R51 Headache: Secondary | ICD-10-CM | POA: Diagnosis not present

## 2017-11-22 ENCOUNTER — Ambulatory Visit (INDEPENDENT_AMBULATORY_CARE_PROVIDER_SITE_OTHER): Payer: Medicare Other | Admitting: Cardiovascular Disease

## 2017-11-22 ENCOUNTER — Telehealth: Payer: Self-pay | Admitting: Cardiovascular Disease

## 2017-11-22 ENCOUNTER — Ambulatory Visit (INDEPENDENT_AMBULATORY_CARE_PROVIDER_SITE_OTHER): Payer: Medicare Other

## 2017-11-22 ENCOUNTER — Encounter: Payer: Self-pay | Admitting: Cardiovascular Disease

## 2017-11-22 VITALS — BP 134/98 | HR 76 | Ht 65.0 in | Wt 165.5 lb

## 2017-11-22 DIAGNOSIS — E782 Mixed hyperlipidemia: Secondary | ICD-10-CM | POA: Diagnosis not present

## 2017-11-22 DIAGNOSIS — I714 Abdominal aortic aneurysm, without rupture, unspecified: Secondary | ICD-10-CM

## 2017-11-22 DIAGNOSIS — I25118 Atherosclerotic heart disease of native coronary artery with other forms of angina pectoris: Secondary | ICD-10-CM

## 2017-11-22 DIAGNOSIS — J449 Chronic obstructive pulmonary disease, unspecified: Secondary | ICD-10-CM | POA: Diagnosis not present

## 2017-11-22 DIAGNOSIS — I2510 Atherosclerotic heart disease of native coronary artery without angina pectoris: Secondary | ICD-10-CM

## 2017-11-22 DIAGNOSIS — R600 Localized edema: Secondary | ICD-10-CM

## 2017-11-22 DIAGNOSIS — E0821 Diabetes mellitus due to underlying condition with diabetic nephropathy: Secondary | ICD-10-CM | POA: Diagnosis not present

## 2017-11-22 DIAGNOSIS — I10 Essential (primary) hypertension: Secondary | ICD-10-CM

## 2017-11-22 DIAGNOSIS — I639 Cerebral infarction, unspecified: Secondary | ICD-10-CM | POA: Diagnosis not present

## 2017-11-22 DIAGNOSIS — F17200 Nicotine dependence, unspecified, uncomplicated: Secondary | ICD-10-CM | POA: Diagnosis not present

## 2017-11-22 DIAGNOSIS — Z23 Encounter for immunization: Secondary | ICD-10-CM

## 2017-11-22 MED ORDER — METOPROLOL TARTRATE 25 MG PO TABS: 25.0000 mg | ORAL_TABLET | Freq: Two times a day (BID) | ORAL | 3 refills | Status: DC

## 2017-11-22 MED ORDER — DOXAZOSIN MESYLATE 1 MG PO TABS: 1.0000 mg | ORAL_TABLET | Freq: Every day | ORAL | 11 refills | Status: DC

## 2017-11-22 MED ORDER — EZETIMIBE 10 MG PO TABS: 10.0000 mg | ORAL_TABLET | Freq: Every day | ORAL | 3 refills | Status: DC

## 2017-11-22 MED ORDER — ROSUVASTATIN CALCIUM 5 MG PO TABS: 5.0000 mg | ORAL_TABLET | Freq: Every day | ORAL | 3 refills | Status: DC

## 2017-11-22 MED ORDER — NITROGLYCERIN 0.4 MG SL SUBL: 0.4000 mg | SUBLINGUAL_TABLET | SUBLINGUAL | 6 refills | Status: DC | PRN

## 2017-11-22 MED ORDER — CLOPIDOGREL BISULFATE 75 MG PO TABS: 75.0000 mg | ORAL_TABLET | Freq: Every day | ORAL | 3 refills | Status: DC

## 2017-11-22 NOTE — Telephone Encounter (Signed)
Spoke with patient to review recommendations from visit today. Dr. [name] did get on the phone and spoke with her in detail. He requested that she have repeat stress testing and she was agreeable with this. Scheduled her stress test and she requested that I please send her instructions to her via mychart message. She verbalized understanding of our conversation, instructions for testing, and appointment information with no further questions at this time.  George  Your caregiver has ordered a Stress Test with nuclear imaging. The purpose of this test is to evaluate the blood supply to your heart muscle. This procedure is referred to as a "Non-Invasive Stress Test." This is because other than having an IV started in your vein, nothing is inserted or "invades" your body. Cardiac stress tests are done to find areas of poor blood flow to the heart by determining the extent of coronary artery disease (CAD). Some patients exercise on a treadmill, which naturally increases the blood flow to your heart, while others who are  unable to walk on a treadmill due to physical limitations have a pharmacologic/chemical stress agent called Lexiscan . This medicine will mimic walking on a treadmill by temporarily increasing your coronary blood flow.   Please note: these test may take anywhere between 2-4 hours to complete  PLEASE REPORT TO [facility] AT THE FIRST DESK WILL DIRECT YOU WHERE TO GO  Date of Procedure:__Monday October 7th____  Arrival Time for Procedure:__07:45AM_______  Instructions regarding medication:   _XX___ : Hold diabetes medication morning of procedure  _XX___:  Hold Metoprolol the night before procedure and morning of procedure  _XX___:  DECREASE Lantus insulin by 1/2 (8 units) the night before your testing. ________________________________  PLEASE NOTIFY THE OFFICE AT LEAST 24 HOURS IN ADVANCE IF YOU ARE UNABLE TO KEEP YOUR APPOINTMENT.   [phone] AND  PLEASE NOTIFY NUCLEAR MEDICINE AT [facility] AT LEAST 24 HOURS IN ADVANCE IF YOU ARE UNABLE TO KEEP YOUR APPOINTMENT. [phone]  How to prepare for your Myoview test:  1. Do not eat or drink after midnight 2. No caffeine for 24 hours prior to test 3. No smoking 24 hours prior to test. 4. Your medication may be taken with water.  If your doctor stopped a medication because of this test, do not take that medication. 5. Please wear comfortable clothing that is easy to get on and off. Skirts or pants are appropriate. Please wear a short sleeve shirt. 6. No perfume, cologne or lotion.

## 2017-11-22 NOTE — Patient Instructions (Signed)
Medication Instructions:   Please start cardura 1 mg once a day  Labwork:  No new labs needed  Testing/Procedures:  No further testing at this time   Follow-Up: It was a pleasure seeing you in the office today. Please call us if you have new issues that need to be addressed before your next appt.  [phone]  Your physician wants you to follow-up in: 12 months.  You will receive a reminder letter in the mail two months in advance. If you don't receive a letter, please call our office to schedule the follow-up appointment.  If you need a refill on your cardiac medications before your next appointment, please call your pharmacy.  For educational health videos Log in to : [email or web address] Or : [email or web address], password : [email or web address]

## 2017-11-27 ENCOUNTER — Encounter
Admission: RE | Admit: 2017-11-27 | Discharge: 2017-11-27 | Disposition: A | Discharge status: Home / Self Care | Payer: Medicare Other | Source: Ambulatory Visit | Attending: Cardiovascular Disease | Admitting: Cardiovascular Disease

## 2017-11-27 ENCOUNTER — Other Ambulatory Visit: Payer: Self-pay | Admitting: *Deleted

## 2017-11-27 DIAGNOSIS — I2510 Atherosclerotic heart disease of native coronary artery without angina pectoris: Secondary | ICD-10-CM

## 2017-11-27 LAB — NM MYOCAR MULTI W/SPECT W/WALL MOTION / EF
CHL CUP MPHR: 149 {beats}/min
CHL CUP NUCLEAR SDS: 0
CHL CUP RESTING HR STRESS: 76 {beats}/min
CSEPEDS: 0 s
Estimated workload: 1 METS
Exercise duration (min): 0 min
LV sys vol: 13 mL
LVDIAVOL: 35 mL (ref 46–106)
NUC STRESS TID: 0.91
Peak HR: 93 {beats}/min
Percent HR: 62 %
SRS: 1
SSS: 0

## 2017-11-27 MED ORDER — TECHNETIUM TC 99M TETROFOSMIN IV KIT
30.3800 | PACK | Freq: Once | INTRAVENOUS | Status: AC | PRN
  Administered 2017-11-27: 30.38 via INTRAVENOUS

## 2017-11-27 MED ORDER — TECHNETIUM TC 99M TETROFOSMIN IV KIT
10.0000 | PACK | Freq: Once | INTRAVENOUS | Status: AC | PRN
  Administered 2017-11-27: 11.02 via INTRAVENOUS

## 2017-11-27 MED ORDER — REGADENOSON 0.4 MG/5ML IV SOLN
0.4000 mg | Freq: Once | INTRAVENOUS | Status: AC
  Administered 2017-11-27: 0.4 mg via INTRAVENOUS

## 2017-11-30 DIAGNOSIS — N2581 Secondary hyperparathyroidism of renal origin: Secondary | ICD-10-CM | POA: Diagnosis not present

## 2017-11-30 DIAGNOSIS — N183 Chronic kidney disease, stage 3 (moderate): Secondary | ICD-10-CM | POA: Diagnosis not present

## 2017-11-30 DIAGNOSIS — E1122 Type 2 diabetes mellitus with diabetic chronic kidney disease: Secondary | ICD-10-CM | POA: Diagnosis not present

## 2017-11-30 DIAGNOSIS — R809 Proteinuria, unspecified: Secondary | ICD-10-CM | POA: Diagnosis not present

## 2017-11-30 DIAGNOSIS — I10 Essential (primary) hypertension: Secondary | ICD-10-CM | POA: Diagnosis not present

## 2017-12-04 DIAGNOSIS — I129 Hypertensive chronic kidney disease with stage 1 through stage 4 chronic kidney disease, or unspecified chronic kidney disease: Secondary | ICD-10-CM | POA: Diagnosis not present

## 2017-12-04 DIAGNOSIS — N2581 Secondary hyperparathyroidism of renal origin: Secondary | ICD-10-CM | POA: Diagnosis not present

## 2017-12-04 DIAGNOSIS — N183 Chronic kidney disease, stage 3 (moderate): Secondary | ICD-10-CM | POA: Diagnosis not present

## 2017-12-04 DIAGNOSIS — R809 Proteinuria, unspecified: Secondary | ICD-10-CM | POA: Diagnosis not present

## 2017-12-04 DIAGNOSIS — E1122 Type 2 diabetes mellitus with diabetic chronic kidney disease: Secondary | ICD-10-CM | POA: Diagnosis not present

## 2017-12-11 ENCOUNTER — Encounter: Payer: Self-pay | Admitting: Family Medicine

## 2017-12-11 DIAGNOSIS — M48061 Spinal stenosis, lumbar region without neurogenic claudication: Secondary | ICD-10-CM

## 2017-12-11 MED ORDER — HYDROCODONE-ACETAMINOPHEN 5-325 MG PO TABS: 1.0000 | ORAL_TABLET | Freq: Four times a day (QID) | ORAL | 0 refills | Status: DC | PRN

## 2017-12-12 ENCOUNTER — Ambulatory Visit: Admission: RE | Admit: 2017-12-12 | Payer: Medicare Other | Source: Ambulatory Visit | Admitting: Gastroenterology

## 2017-12-12 ENCOUNTER — Encounter: Admission: RE | Payer: Self-pay | Source: Ambulatory Visit

## 2017-12-12 SURGERY — COLONOSCOPY WITH PROPOFOL
Anesthesia: General

## 2017-12-27 DIAGNOSIS — E119 Type 2 diabetes mellitus without complications: Secondary | ICD-10-CM | POA: Diagnosis not present

## 2017-12-27 LAB — HM DIABETES EYE EXAM

## 2018-01-08 ENCOUNTER — Other Ambulatory Visit: Payer: Self-pay | Admitting: *Deleted

## 2018-01-08 MED ORDER — LISINOPRIL 40 MG PO TABS: 40.0000 mg | ORAL_TABLET | Freq: Every day | ORAL | 3 refills | Status: DC

## 2018-01-11 ENCOUNTER — Ambulatory Visit: Payer: Medicare Other | Admitting: Anesthesiology

## 2018-01-11 ENCOUNTER — Encounter: Payer: Self-pay | Admitting: Anesthesiology

## 2018-01-11 ENCOUNTER — Encounter: Payer: Self-pay | Admitting: Family Medicine

## 2018-01-11 ENCOUNTER — Encounter
Admission: RE | Disposition: A | Discharge status: Home / Self Care | Payer: Self-pay | Source: Ambulatory Visit | Attending: Gastroenterology

## 2018-01-11 ENCOUNTER — Ambulatory Visit
Admission: RE | Admit: 2018-01-11 | Discharge: 2018-01-11 | Disposition: A | Discharge status: Home / Self Care | Payer: Medicare Other | Source: Ambulatory Visit | Attending: Gastroenterology | Admitting: Gastroenterology

## 2018-01-11 DIAGNOSIS — K5520 Angiodysplasia of colon without hemorrhage: Secondary | ICD-10-CM | POA: Diagnosis not present

## 2018-01-11 DIAGNOSIS — Z1211 Encounter for screening for malignant neoplasm of colon: Secondary | ICD-10-CM | POA: Insufficient documentation

## 2018-01-11 DIAGNOSIS — K635 Polyp of colon: Secondary | ICD-10-CM | POA: Diagnosis not present

## 2018-01-11 DIAGNOSIS — M1990 Unspecified osteoarthritis, unspecified site: Secondary | ICD-10-CM | POA: Diagnosis not present

## 2018-01-11 DIAGNOSIS — I129 Hypertensive chronic kidney disease with stage 1 through stage 4 chronic kidney disease, or unspecified chronic kidney disease: Secondary | ICD-10-CM | POA: Insufficient documentation

## 2018-01-11 DIAGNOSIS — K648 Other hemorrhoids: Secondary | ICD-10-CM | POA: Diagnosis not present

## 2018-01-11 DIAGNOSIS — I2510 Atherosclerotic heart disease of native coronary artery without angina pectoris: Secondary | ICD-10-CM | POA: Insufficient documentation

## 2018-01-11 DIAGNOSIS — F17210 Nicotine dependence, cigarettes, uncomplicated: Secondary | ICD-10-CM | POA: Insufficient documentation

## 2018-01-11 DIAGNOSIS — E785 Hyperlipidemia, unspecified: Secondary | ICD-10-CM | POA: Diagnosis not present

## 2018-01-11 DIAGNOSIS — M48061 Spinal stenosis, lumbar region without neurogenic claudication: Secondary | ICD-10-CM

## 2018-01-11 DIAGNOSIS — J449 Chronic obstructive pulmonary disease, unspecified: Secondary | ICD-10-CM | POA: Insufficient documentation

## 2018-01-11 DIAGNOSIS — Z79899 Other long term (current) drug therapy: Secondary | ICD-10-CM | POA: Diagnosis not present

## 2018-01-11 DIAGNOSIS — E1122 Type 2 diabetes mellitus with diabetic chronic kidney disease: Secondary | ICD-10-CM | POA: Insufficient documentation

## 2018-01-11 DIAGNOSIS — Z7982 Long term (current) use of aspirin: Secondary | ICD-10-CM | POA: Diagnosis not present

## 2018-01-11 DIAGNOSIS — K5730 Diverticulosis of large intestine without perforation or abscess without bleeding: Secondary | ICD-10-CM | POA: Diagnosis not present

## 2018-01-11 DIAGNOSIS — K5790 Diverticulosis of intestine, part unspecified, without perforation or abscess without bleeding: Secondary | ICD-10-CM | POA: Diagnosis not present

## 2018-01-11 DIAGNOSIS — Z86010 Personal history of colonic polyps: Secondary | ICD-10-CM | POA: Diagnosis not present

## 2018-01-11 DIAGNOSIS — Z794 Long term (current) use of insulin: Secondary | ICD-10-CM | POA: Diagnosis not present

## 2018-01-11 DIAGNOSIS — K633 Ulcer of intestine: Secondary | ICD-10-CM | POA: Insufficient documentation

## 2018-01-11 DIAGNOSIS — D122 Benign neoplasm of ascending colon: Secondary | ICD-10-CM | POA: Diagnosis not present

## 2018-01-11 DIAGNOSIS — N183 Chronic kidney disease, stage 3 (moderate): Secondary | ICD-10-CM | POA: Insufficient documentation

## 2018-01-11 DIAGNOSIS — K640 First degree hemorrhoids: Secondary | ICD-10-CM | POA: Diagnosis not present

## 2018-01-11 DIAGNOSIS — K644 Residual hemorrhoidal skin tags: Secondary | ICD-10-CM | POA: Diagnosis not present

## 2018-01-11 DIAGNOSIS — D123 Benign neoplasm of transverse colon: Secondary | ICD-10-CM | POA: Insufficient documentation

## 2018-01-11 DIAGNOSIS — Z7902 Long term (current) use of antithrombotics/antiplatelets: Secondary | ICD-10-CM | POA: Insufficient documentation

## 2018-01-11 HISTORY — PX: COLONOSCOPY WITH PROPOFOL: SHX5780

## 2018-01-11 LAB — GLUCOSE, CAPILLARY: Glucose-Capillary: 103 mg/dL — ABNORMAL HIGH (ref 70–99)

## 2018-01-11 SURGERY — COLONOSCOPY WITH PROPOFOL
Anesthesia: General

## 2018-01-11 MED ORDER — PROPOFOL 500 MG/50ML IV EMUL
INTRAVENOUS | Status: AC
  Filled 2018-01-11: qty 50

## 2018-01-11 MED ORDER — SODIUM CHLORIDE 0.9 % IV SOLN
INTRAVENOUS | Status: DC
  Administered 2018-01-11: 1000 mL via INTRAVENOUS

## 2018-01-11 MED ORDER — LIDOCAINE HCL (CARDIAC) PF 100 MG/5ML IV SOSY
PREFILLED_SYRINGE | INTRAVENOUS | Status: DC | PRN
  Administered 2018-01-11: 30 mg via INTRAVENOUS

## 2018-01-11 MED ORDER — IPRATROPIUM-ALBUTEROL 0.5-2.5 (3) MG/3ML IN SOLN: 3.0000 mL | Freq: Four times a day (QID) | RESPIRATORY_TRACT | Status: DC

## 2018-01-11 MED ORDER — FENTANYL CITRATE (PF) 100 MCG/2ML IJ SOLN
INTRAMUSCULAR | Status: DC | PRN
  Administered 2018-01-11 (×2): 50 ug via INTRAVENOUS

## 2018-01-11 MED ORDER — HYDROCODONE-ACETAMINOPHEN 5-325 MG PO TABS: 1.0000 | ORAL_TABLET | Freq: Four times a day (QID) | ORAL | 0 refills | Status: DC | PRN

## 2018-01-11 MED ORDER — MIDAZOLAM HCL 2 MG/2ML IJ SOLN
INTRAMUSCULAR | Status: AC
  Filled 2018-01-11: qty 2

## 2018-01-11 MED ORDER — LIDOCAINE HCL (PF) 2 % IJ SOLN
INTRAMUSCULAR | Status: AC
  Filled 2018-01-11: qty 10

## 2018-01-11 MED ORDER — IPRATROPIUM-ALBUTEROL 0.5-2.5 (3) MG/3ML IN SOLN
RESPIRATORY_TRACT | Status: AC
  Filled 2018-01-11: qty 3

## 2018-01-11 MED ORDER — FENTANYL CITRATE (PF) 100 MCG/2ML IJ SOLN
INTRAMUSCULAR | Status: AC
  Filled 2018-01-11: qty 2

## 2018-01-11 MED ORDER — SODIUM CHLORIDE 0.9 % IV SOLN
INTRAVENOUS | Status: DC
  Administered 2018-01-11: 08:00:00 via INTRAVENOUS

## 2018-01-11 MED ORDER — SPOT INK MARKER SYRINGE KIT
PACK | SUBMUCOSAL | Status: DC | PRN
  Administered 2018-01-11: 4 mL via SUBMUCOSAL

## 2018-01-11 MED ORDER — MIDAZOLAM HCL 2 MG/2ML IJ SOLN
INTRAMUSCULAR | Status: DC | PRN
  Administered 2018-01-11: 1 mg via INTRAVENOUS

## 2018-01-11 MED ORDER — PROPOFOL 500 MG/50ML IV EMUL
INTRAVENOUS | Status: DC | PRN
  Administered 2018-01-11: 120 ug/kg/min via INTRAVENOUS

## 2018-01-11 MED ORDER — IPRATROPIUM-ALBUTEROL 0.5-2.5 (3) MG/3ML IN SOLN
RESPIRATORY_TRACT | Status: AC
  Administered 2018-01-11: 3 mL
  Filled 2018-01-11: qty 3

## 2018-01-11 NOTE — H&P (Signed)
Outpatient short stay form Pre-procedure 01/11/2018 7:31 AM Lollie Sails MD  Primary Physician: Dr. Lelon Huh  Reason for visit: Colonoscopy  History of present illness: Patient is a 72 year old female presenting today for colonoscopy.  History of adenomatous colon polyps and a fairly circuitous colon.  Last colonoscopy was 09/23/2014.  Tolerated prep well but states she may still have some poor prep.  She does take Plavix which has been held for 6 days.  She also held her 81 mg aspirin.  She takes no other aspirin products or blood thinning agent.    Current Facility-Administered Medications:  .  0.9 %  sodium chloride infusion, , Intravenous, Continuous, Lollie Sails, MD .  0.9 %  sodium chloride infusion, , Intravenous, Continuous, Lollie Sails, MD, Last Rate: 20 mL/hr at 01/11/18 0700, 1,000 mL at 01/11/18 0700  Medications Prior to Admission  Medication Sig Dispense Refill Last Dose  . acetaminophen (TYLENOL) 500 MG tablet Take 500 mg by mouth every 6 (six) hours as needed.   Past Week at Unknown time  . ACYCLOVIR PO Take by mouth.   Past Month at Unknown time  . albuterol (VENTOLIN HFA) 108 (90 Base) MCG/ACT inhaler Inhale 2 puffs into the lungs every 4 (four) hours as needed for wheezing or shortness of breath. 48 g 3 Past Week at Unknown time  . aspirin (ASPIR-81) 81 MG EC tablet Take 81 mg by mouth daily.     Past Week at Unknown time  . chlorhexidine (PERIDEX) 0.12 % solution Use as directed 15 mLs in the mouth or throat 2 (two) times daily. (Patient taking differently: Use as directed 15 mLs in the mouth or throat 2 (two) times daily. ) 120 mL 6 Past Week at Unknown time  . clopidogrel (PLAVIX) 75 MG tablet Take 1 tablet (75 mg total) by mouth daily. 90 tablet 3 Past Week at Unknown time  . ezetimibe (ZETIA) 10 MG tablet Take 1 tablet (10 mg total) by mouth daily. 90 tablet 3 01/10/2018 at Unknown time  . fluticasone furoate-vilanterol (BREO ELLIPTA) 100-25  MCG/INH AEPB Inhale 1 puff into the lungs daily. 84 each 3 Past Week at Unknown time  . HYDROcodone-acetaminophen (NORCO/VICODIN) 5-325 MG tablet Take 1 tablet by mouth every 6 (six) hours as needed. 120 tablet 0 01/10/2018 at Unknown time  . insulin glargine (LANTUS) 100 UNIT/ML injection Inject 12 Units into the skin daily.    01/10/2018 at Unknown time  . Liraglutide (VICTOZA) 18 MG/3ML SOLN injection Inject 1.2 mg into the skin daily.    Past Week at Unknown time  . lisinopril (PRINIVIL,ZESTRIL) 40 MG tablet Take 1 tablet (40 mg total) by mouth daily. 90 tablet 3 01/11/2018 at 0515  . meclizine (ANTIVERT) 25 MG tablet Take by mouth as needed.    Past Week at Unknown time  . metFORMIN (GLUCOPHAGE) 1000 MG tablet Take 1,000 mg by mouth 2 (two) times daily with a meal.   Past Week at Unknown time  . metoprolol tartrate (LOPRESSOR) 25 MG tablet Take 1 tablet (25 mg total) by mouth 2 (two) times daily. 180 tablet 3 01/11/2018 at 0515  . Multiple Vitamins-Minerals (DAILY MULTI) TABS Take 1 tablet by mouth daily.    Past Week at Unknown time  . nitroGLYCERIN (NITROSTAT) 0.4 MG SL tablet Place 1 tablet (0.4 mg total) under the tongue every 5 (five) minutes as needed. 25 tablet 6 Past Week at Unknown time  . Omega-3 Fatty Acids (FISH OIL) 1000 MG  CAPS Take 2 capsules by mouth 2 (two) times daily. Take 4   Past Week at Unknown time  . ondansetron (ZOFRAN ODT) 4 MG disintegrating tablet Take 1 tablet (4 mg total) by mouth every 8 (eight) hours as needed for nausea or vomiting. 20 tablet 0 Past Week at Unknown time  . rosuvastatin (CRESTOR) 5 MG tablet Take 1 tablet (5 mg total) by mouth daily. 90 tablet 3 01/10/2018 at Unknown time  . tiotropium (SPIRIVA) 18 MCG inhalation capsule Place 1 capsule (18 mcg total) into inhaler and inhale daily. 90 capsule 4 Past Week at Unknown time  . amLODipine (NORVASC) 5 MG tablet Take 5 mg by mouth daily.   Not Taking at Unknown time  . buPROPion (WELLBUTRIN SR) 150 MG 12  hr tablet 1 tablet daily for 3 days, then 1 tablet twice daily. Stop smoking 14 days after starting medication (Patient not taking: Reported on 11/22/2017) 60 tablet 5 Not Taking  . doxazosin (CARDURA) 1 MG tablet Take 1 tablet (1 mg total) by mouth daily. 30 tablet 11   . montelukast (SINGULAIR) 10 MG tablet Take 1 tablet (10 mg total) by mouth at bedtime. (Patient taking differently: Take 10 mg by mouth as needed. ) 30 tablet 12 Taking  . RABEprazole (ACIPHEX) 20 MG tablet Take 20 mg by mouth daily.    Taking  . sucralfate (CARAFATE) 1 g tablet Take 1 g by mouth 2 (two) times daily.    Taking     Allergies  Allergen Reactions  . Sulfonamide Derivatives Other (See Comments)    Last taken as a child; made her mouth break out  . Amlodipine     Granuloma annulare  . Statins Other (See Comments)    Caused elevated liver function studies (02/22/17-pt taking rosuvastatin without problems) Caused elevated liver function studies  . Sulfa Antibiotics Other (See Comments)    Mouth blisters Welts on mouth   . Codeine Nausea And Vomiting  . Prednisone Palpitations and Other (See Comments)    Made her legs "feel weird."     Past Medical History:  Diagnosis Date  . Anemia   . Arthritis   . CAD (coronary artery disease)    a. cath 02/2006: BMS x 2 to RCA, cath o/w without significant coronary disease; b. nuclear stress test 07/2014: no signs of ischemia, no ekg changes concerning for ischemia, low risk study/normal study  . Chronic bronchitis ([place])    secondary to cigarette smoking  . Chronic kidney disease (CKD), stage III (moderate) ([facility])   . COPD (chronic obstructive pulmonary disease) ([place])   . Diabetes mellitus   . FHx: allergies   . GERD (gastroesophageal reflux disease)   . Goiter   . Granulomatous disease ([place])   . Hernia   . Hyperlipidemia   . Hypertension   . Kidney stone on left side 2013  . Microalbuminuria   . Obesity   . Smokers' cough ([place])   . Spinal stenosis   . Stroke  ([place]) 10/29/2016   mild left side weakness    Review of systems:      Physical Exam    Heart and lungs: Regular rate and rhythm without rub or gallop, lungs are bilaterally clear.    HEENT: Normocephalic atraumatic eyes are anicteric    Other:    Pertinant exam for procedure: Soft nontender nondistended bowel sounds positive normoactive    Planned proceedures: Colonoscopy and indicated procedures. I have discussed the risks benefits and complications of procedures to  include not limited to bleeding, infection, perforation and the risk of sedation and the patient wishes to proceed.    Lollie Sails, MD Gastroenterology 01/11/2018  7:31 AM

## 2018-01-11 NOTE — Anesthesia Preprocedure Evaluation (Signed)
Anesthesia Evaluation  Patient identified by MRN, date of birth, ID band Patient awake    Reviewed: Allergy & Precautions, H&P , NPO status , Patient's Chart, lab work & pertinent test results  History of Anesthesia Complications Negative for: history of anesthetic complications  Airway Mallampati: III  TM Distance: <3 FB Neck ROM: limited    Dental  (+) Chipped, Poor Dentition   Pulmonary shortness of breath and with exertion, COPD, Current Smoker,           Cardiovascular Exercise Tolerance: Good hypertension, (-) angina+ CAD  (-) DOE      Neuro/Psych  Neuromuscular disease CVA negative psych ROS   GI/Hepatic Neg liver ROS, GERD  Medicated and Controlled,  Endo/Other  diabetes, Type 2, Insulin Dependent  Renal/GU CRFRenal disease  negative genitourinary   Musculoskeletal  (+) Arthritis ,   Abdominal   Peds  Hematology negative hematology ROS (+)   Anesthesia Other Findings Past Medical History: No date: Anemia No date: Arthritis No date: CAD (coronary artery disease)     Comment:  a. cath 02/2006: BMS x 2 to RCA, cath o/w without               significant coronary disease; b. 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[place]) No date: Hernia No date: Hyperlipidemia No date: Hypertension 2013: Kidney stone on left side No date: Microalbuminuria No date: Obesity No date: Smokers' cough ([place]) No date: Spinal stenosis 10/29/2016: Stroke ([place])     Comment:   mild left side weakness  Past Surgical History: No date: ABDOMINAL HYSTERECTOMY No date: BREAST BIOPSY 03/01/2017: CATARACT EXTRACTION W/PHACO; Right     Comment:  Procedure: CATARACT EXTRACTION PHACO AND INTRAOCULAR               LENS PLACEMENT ([place]) RIGHT DIABETIC;  Surgeon:               Leandrew Koyanagi, MD;  Location: [place];              Service: Ophthalmology;  Laterality: Right; 03/22/2017: CATARACT EXTRACTION W/PHACO; Left     Comment:  Procedure: CATARACT EXTRACTION PHACO AND INTRAOCULAR               LENS PLACEMENT ([place]) LEFT DIABETIC;  Surgeon: Leandrew Koyanagi, MD;  Location: [place];  Service:               Ophthalmology;  Laterality: Left;  Diabetic - insulin and              oral meds 09/23/2014: COLONOSCOPY WITH PROPOFOL; N/A     Comment:  Procedure: COLONOSCOPY WITH PROPOFOL;  Surgeon: Lollie Sails, MD;  Location: [facility] ENDOSCOPY;  Service:  Endoscopy;  Laterality: N/A; 2008: CORONARY ANGIOPLASTY WITH STENT PLACEMENT 12/30/2014: ESOPHAGOGASTRODUODENOSCOPY (EGD) WITH PROPOFOL; N/A     Comment:  Procedure: ESOPHAGOGASTRODUODENOSCOPY (EGD) WITH               PROPOFOL;  Surgeon: Lollie Sails, MD;  Location:               [facility] ENDOSCOPY;  Service: Endoscopy;  Laterality: N/A; 07/19/2016: ESOPHAGOGASTRODUODENOSCOPY (EGD) WITH PROPOFOL; N/A     Comment:  Procedure: ESOPHAGOGASTRODUODENOSCOPY (EGD) WITH               PROPOFOL;  Surgeon: Lollie Sails, MD;  Location:               [facility] ENDOSCOPY;  Service: Endoscopy;  Laterality: N/A; No date: hysterectomy (other)  BMI    Body Mass Index:  26.96 kg/m      Reproductive/Obstetrics negative OB ROS                             Anesthesia Physical Anesthesia Plan  ASA: III  Anesthesia Plan: General   Post-op Pain Management:    Induction: Intravenous  PONV Risk Score and Plan: Propofol infusion and TIVA  Airway  Management Planned: Natural Airway and Nasal Cannula  Additional Equipment:   Intra-op Plan:   Post-operative Plan:   Informed Consent: I have reviewed the patients History and Physical, chart, labs and discussed the procedure including the risks, benefits and alternatives for the proposed anesthesia with the patient or authorized representative who has indicated his/her understanding and acceptance.   Dental Advisory Given  Plan Discussed with: Anesthesiologist, CRNA and Surgeon  Anesthesia Plan Comments: (Patient consented for risks of anesthesia including but not limited to:  - adverse reactions to medications - risk of intubation if required - damage to teeth, lips or other oral mucosa - sore throat or hoarseness - Damage to heart, brain, lungs or loss of life  Patient voiced understanding.)        Anesthesia Quick Evaluation

## 2018-01-11 NOTE — Op Note (Addendum)
[facility] Gastroenterology Patient Name: Hannah Kirby Procedure Date: 01/11/2018 7:18 AM MRN: [id number] Account #: [id number] Date of Birth: [birth year] Admit Type: Outpatient Age: 72 Room: [facility] ENDO ROOM 3 Gender: Female Note Status: Finalized Procedure:            Colonoscopy Indications:          Personal history of colonic polyps Providers:            Lollie Sails, MD Referring MD:         Kirstie Peri. Caryn Section, MD (Referring MD) Medicines:            Monitored Anesthesia Care Complications:        No immediate complications. Procedure:            Pre-Anesthesia Assessment:                       - ASA Grade Assessment: III - A patient with severe                        systemic disease.                       After obtaining informed consent, the colonoscope was                        passed under direct vision. Throughout the procedure,                        the patient's blood pressure, pulse, and oxygen                        saturations were monitored continuously. The                        Colonoscope was introduced through the anus and                        advanced to the the cecum, identified by appendiceal                        orifice and ileocecal valve. The colonoscopy was                        performed with moderate difficulty due to poor bowel                        prep with stool present. Successful completion of the                        procedure was aided by lavage. Findings:      A 5 mm polyp was found in the ascending colon. The polyp was sessile.       The polyp was removed with a cold snare. Resection and retrieval were       complete.      A 6 mm polyp was found in the ascending colon. The polyp was sessile.       The polyp was removed with a piecemeal technique using a cold biopsy       forceps. Resection and retrieval were complete.      Both ascending colon polyps are placed in the  same container.      A 1 mm polyp was  found in the transverse colon. The polyp was sessile.       The polyp was removed with a cold biopsy forceps. Resection and       retrieval were complete.      A single (solitary) about 1-2 mm wide and 3 cm long mm ulcer was found       at the splenic flexure. No bleeding was present. No stigmata of recent       bleeding were seen though friable on biopsy. Area was tattooed with an       injection of 4 mL of [place] ink.      Non-bleeding internal hemorrhoids were found during retroflexion. The       hemorrhoids were small and Grade I (internal hemorrhoids that do not       prolapse).      No additional abnormalities were found on retroflexion.      Skin tags were found on perianal exam.      A single medium-sized localized angioectasia without bleeding was found       in the proximal ascending colon, deep to the mucosa.      Many small and large-mouthed diverticula were found in the sigmoid       colon, descending colon and transverse colon. Impression:           - One 5 mm polyp in the ascending colon, removed with a                        cold snare. Resected and retrieved.                       - One 6 mm polyp in the ascending colon, removed                        piecemeal using a cold biopsy forceps. Resected and                        retrieved.                       - One 1 mm polyp in the transverse colon, removed with                        a cold biopsy forceps. Resected and retrieved.                       - A single (solitary) ulcer at the splenic flexure.                        Tattooed.                       - Non-bleeding internal hemorrhoids.                       - Perianal skin tags found on perianal exam. Recommendation:       - Await pathology results.                       - Telephone GI clinic for pathology results in 1 week. Procedure Code(s):    --- Professional ---  [id number], Colonoscopy, flexible; with removal of tumor(s),                         polyp(s), or other lesion(s) by snare technique                       45380, 59, Colonoscopy, flexible; with biopsy, single                        or multiple                       45381, Colonoscopy, flexible; with directed submucosal                        injection(s), any substance Diagnosis Code(s):    --- Professional ---                       D12.2, Benign neoplasm of ascending colon                       D12.3, Benign neoplasm of transverse colon (hepatic                        flexure or splenic flexure)                       K63.3, Ulcer of intestine                       K64.0, First degree hemorrhoids                       K64.4, Residual hemorrhoidal skin tags                       Z86.010, Personal history of colonic polyps CPT copyright 2018 [employer]. All rights reserved. The codes documented in this report are preliminary and upon coder review may  be revised to meet current compliance requirements. Lollie Sails, MD 01/11/2018 9:01:29 AM This report has been signed electronically. Number of Addenda: 0 Note Initiated On: 01/11/2018 7:18 AM Scope Withdrawal Time: 0 hours 18 minutes 24 seconds  Total Procedure Duration: 0 hours 53 minutes 41 seconds       [facility]

## 2018-01-11 NOTE — Transfer of Care (Signed)
Immediate Anesthesia Transfer of Care Note  Patient: [name]  Procedure(s) Performed: COLONOSCOPY WITH PROPOFOL (N/A )  Patient Location: PACU  Anesthesia Type:General  Level of Consciousness: awake and sedated  Airway & Oxygen Therapy: Patient Spontanous Breathing and Patient connected to face mask oxygen  Post-op Assessment: Report given to RN and Post -op Vital signs reviewed and stable  Post vital signs: Reviewed and stable  Last Vitals:  Vitals Value Taken Time  BP 173/87 01/11/2018  8:58 AM  Temp 36 C 01/11/2018  8:58 AM  Pulse 70 01/11/2018  8:59 AM  Resp 16 01/11/2018  9:00 AM  SpO2 100 % 01/11/2018  8:59 AM  Vitals shown include unvalidated device data.  Last Pain:  Vitals:   01/11/18 0858  TempSrc: Tympanic  PainSc: Asleep         Complications: No apparent anesthesia complications

## 2018-01-11 NOTE — Anesthesia Procedure Notes (Signed)
Performed by: Cook-Martin, Jabaree Mercado °Pre-anesthesia Checklist: Patient identified, Emergency Drugs available, Suction available, Patient being monitored and Timeout performed °Patient Re-evaluated:Patient Re-evaluated prior to induction °Oxygen Delivery Method: Simple face mask °Preoxygenation: Pre-oxygenation with 100% oxygen °Induction Type: IV induction °Placement Confirmation: positive ETCO2 and CO2 detector ° ° ° ° ° ° °

## 2018-01-11 NOTE — Anesthesia Post-op Follow-up Note (Signed)
Anesthesia QCDR form completed.        

## 2018-01-11 NOTE — Anesthesia Postprocedure Evaluation (Signed)
Anesthesia Post Note  Patient: Hannah Kirby  Procedure(s) Performed: COLONOSCOPY WITH PROPOFOL (N/A )  Patient location during evaluation: Endoscopy Anesthesia Type: General Level of consciousness: awake and alert Pain management: pain level controlled Vital Signs Assessment: post-procedure vital signs reviewed and stable Respiratory status: spontaneous breathing, nonlabored ventilation, respiratory function stable and patient connected to nasal cannula oxygen Cardiovascular status: blood pressure returned to baseline and stable Postop Assessment: no apparent nausea or vomiting Anesthetic complications: no     Last Vitals:  Vitals:   01/11/18 0918 01/11/18 0928  BP: (!) 185/99 (!) 199/96  Pulse: 61 62  Resp: 16 18  Temp:    SpO2: 100% 99%    Last Pain:  Vitals:   01/11/18 0928  TempSrc:   PainSc: 0-No pain                 [name]

## 2018-01-12 ENCOUNTER — Encounter: Payer: Self-pay | Admitting: Gastroenterology

## 2018-01-15 LAB — SURGICAL PATHOLOGY

## 2018-01-22 ENCOUNTER — Encounter: Payer: Self-pay | Admitting: Family Medicine

## 2018-01-22 DIAGNOSIS — K633 Ulcer of intestine: Secondary | ICD-10-CM | POA: Insufficient documentation

## 2018-01-22 DIAGNOSIS — Z86010 Personal history of colonic polyps: Secondary | ICD-10-CM | POA: Insufficient documentation

## 2018-01-22 DIAGNOSIS — K5730 Diverticulosis of large intestine without perforation or abscess without bleeding: Secondary | ICD-10-CM | POA: Insufficient documentation

## 2018-02-09 ENCOUNTER — Ambulatory Visit (INDEPENDENT_AMBULATORY_CARE_PROVIDER_SITE_OTHER): Payer: Medicare Other

## 2018-02-09 ENCOUNTER — Ambulatory Visit (INDEPENDENT_AMBULATORY_CARE_PROVIDER_SITE_OTHER): Payer: Medicare Other | Admitting: Vascular Surgery

## 2018-02-09 ENCOUNTER — Encounter (INDEPENDENT_AMBULATORY_CARE_PROVIDER_SITE_OTHER): Payer: Self-pay | Admitting: Vascular Surgery

## 2018-02-09 VITALS — BP 184/96 | HR 72 | Resp 12 | Ht 65.0 in | Wt 167.0 lb

## 2018-02-09 DIAGNOSIS — I129 Hypertensive chronic kidney disease with stage 1 through stage 4 chronic kidney disease, or unspecified chronic kidney disease: Secondary | ICD-10-CM | POA: Diagnosis not present

## 2018-02-09 DIAGNOSIS — F17200 Nicotine dependence, unspecified, uncomplicated: Secondary | ICD-10-CM | POA: Diagnosis not present

## 2018-02-09 DIAGNOSIS — E0821 Diabetes mellitus due to underlying condition with diabetic nephropathy: Secondary | ICD-10-CM | POA: Diagnosis not present

## 2018-02-09 DIAGNOSIS — I714 Abdominal aortic aneurysm, without rupture, unspecified: Secondary | ICD-10-CM

## 2018-02-09 DIAGNOSIS — I10 Essential (primary) hypertension: Secondary | ICD-10-CM

## 2018-02-09 DIAGNOSIS — E7849 Other hyperlipidemia: Secondary | ICD-10-CM | POA: Diagnosis not present

## 2018-02-09 DIAGNOSIS — N183 Chronic kidney disease, stage 3 unspecified: Secondary | ICD-10-CM

## 2018-02-09 DIAGNOSIS — I2510 Atherosclerotic heart disease of native coronary artery without angina pectoris: Secondary | ICD-10-CM

## 2018-02-09 DIAGNOSIS — I639 Cerebral infarction, unspecified: Secondary | ICD-10-CM

## 2018-02-09 NOTE — Assessment & Plan Note (Signed)
Her aortic aneurysm today measures 3.4 cm in maximal diameter by duplex.  No surgery or intervention at this time. The patient has an asymptomatic abdominal aortic aneurysm that is less than 4 cm in maximal diameter.  I have discussed the natural history of abdominal aortic aneurysm and the small risk of rupture for aneurysm less than 5 cm in size.  However, as these small aneurysms tend to enlarge over time, continued surveillance with ultrasound or CT scan is mandatory.  I have also discussed optimizing medical management with hypertension and lipid control and the importance of abstinence from tobacco.  The patient is also encouraged to exercise a minimum of 30 minutes 4 times a week.  Should the patient develop new onset abdominal or back pain or signs of peripheral embolization they are instructed to seek medical attention immediately and to alert the physician providing care that they have an aneurysm.  The patient voices their understanding. The patient will return in 12 months with an aortic duplex.

## 2018-02-09 NOTE — Progress Notes (Signed)
MRN : [id number]  Hannah Kirby is a 72 y.o. (Sep 29, 1945) female who presents with chief complaint of  Chief Complaint  Patient presents with  . Follow-up  .  History of Present Illness: Patient returns today in follow up of her abdominal aortic aneurysm.  She is doing well today without any rhythm related symptoms. Specifically, the patient denies new back or abdominal pain, or signs of peripheral embolization.  She does continue to smoke although she is trying to quit.  Her husband says she has cut back. Her aortic aneurysm today measures 3.4 cm in maximal diameter by duplex.  Current Outpatient Medications  Medication Sig Dispense Refill  . acetaminophen (TYLENOL) 500 MG tablet Take 500 mg by mouth every 6 (six) hours as needed.    Marland Kitchen albuterol (VENTOLIN HFA) 108 (90 Base) MCG/ACT inhaler Inhale 2 puffs into the lungs every 4 (four) hours as needed for wheezing or shortness of breath. 48 g 3  . aspirin (ASPIR-81) 81 MG EC tablet Take 81 mg by mouth daily.      . chlorhexidine (PERIDEX) 0.12 % solution Use as directed 15 mLs in the mouth or throat 2 (two) times daily. (Patient taking differently: Use as directed 15 mLs in the mouth or throat 2 (two) times daily. ) 120 mL 6  . clopidogrel (PLAVIX) 75 MG tablet Take 1 tablet (75 mg total) by mouth daily. 90 tablet 3  . doxazosin (CARDURA) 1 MG tablet Take 1 tablet (1 mg total) by mouth daily. 30 tablet 11  . ezetimibe (ZETIA) 10 MG tablet Take 1 tablet (10 mg total) by mouth daily. 90 tablet 3  . fluticasone furoate-vilanterol (BREO ELLIPTA) 100-25 MCG/INH AEPB Inhale 1 puff into the lungs daily. 84 each 3  . HYDROcodone-acetaminophen (NORCO/VICODIN) 5-325 MG tablet Take 1 tablet by mouth every 6 (six) hours as needed. 120 tablet 0  . insulin glargine (LANTUS) 100 UNIT/ML injection Inject 12 Units into the skin daily.     . Liraglutide (VICTOZA) 18 MG/3ML SOLN injection Inject 1.2 mg into the skin daily.     Marland Kitchen lisinopril  (PRINIVIL,ZESTRIL) 40 MG tablet Take 1 tablet (40 mg total) by mouth daily. 90 tablet 3  . meclizine (ANTIVERT) 25 MG tablet Take by mouth as needed.     . metoprolol tartrate (LOPRESSOR) 25 MG tablet Take 1 tablet (25 mg total) by mouth 2 (two) times daily. 180 tablet 3  . montelukast (SINGULAIR) 10 MG tablet Take 1 tablet (10 mg total) by mouth at bedtime. (Patient taking differently: Take 10 mg by mouth as needed. ) 30 tablet 12  . Multiple Vitamins-Minerals (DAILY MULTI) TABS Take 1 tablet by mouth daily.     . nitroGLYCERIN (NITROSTAT) 0.4 MG SL tablet Place 1 tablet (0.4 mg total) under the tongue every 5 (five) minutes as needed. 25 tablet 6  . Omega-3 Fatty Acids (FISH OIL) 1000 MG CAPS Take 2 capsules by mouth 2 (two) times daily. Take 4    . ondansetron (ZOFRAN ODT) 4 MG disintegrating tablet Take 1 tablet (4 mg total) by mouth every 8 (eight) hours as needed for nausea or vomiting. 20 tablet 0  . rosuvastatin (CRESTOR) 5 MG tablet Take 1 tablet (5 mg total) by mouth daily. 90 tablet 3  . tiotropium (SPIRIVA) 18 MCG inhalation capsule Place 1 capsule (18 mcg total) into inhaler and inhale daily. 90 capsule 4  . RABEprazole (ACIPHEX) 20 MG tablet Take 20 mg by mouth daily.     Marland Kitchen  sucralfate (CARAFATE) 1 g tablet Take 1 g by mouth 2 (two) times daily.      No current facility-administered medications for this visit.     Past Medical History:  Diagnosis Date  . Anemia   . Arthritis   . CAD (coronary artery disease)    a. cath 02/2006: BMS x 2 to RCA, cath o/w without significant coronary disease; b. nuclear stress test 07/2014: no signs of ischemia, no ekg changes concerning for ischemia, low risk study/normal study  . Chronic bronchitis ([place])    secondary to cigarette smoking  . Chronic kidney disease (CKD), stage III (moderate) (HCC)   . COPD (chronic obstructive pulmonary disease) ([place])   . Diabetes mellitus   . FHx: allergies   . GERD (gastroesophageal reflux disease)   . Goiter    . Granulomatous disease ([place])   . Hernia   . Hyperlipidemia   . Hypertension   . Kidney stone on left side 2013  . Microalbuminuria   . Obesity   . Smokers' cough ([place])   . Spinal stenosis   . Stroke ([place]) 10/29/2016   mild left side weakness    Past Surgical History:  Procedure Laterality Date  . ABDOMINAL HYSTERECTOMY    . BREAST BIOPSY    . CATARACT EXTRACTION W/PHACO Right 03/01/2017   Procedure: CATARACT EXTRACTION PHACO AND INTRAOCULAR LENS PLACEMENT ([place]) RIGHT DIABETIC;  Surgeon: Leandrew Koyanagi, MD;  Location: [facility];  Service: Ophthalmology;  Laterality: Right;  . CATARACT EXTRACTION W/PHACO Left 03/22/2017   Procedure: CATARACT EXTRACTION PHACO AND INTRAOCULAR LENS PLACEMENT ([place]) LEFT DIABETIC;  Surgeon: Leandrew Koyanagi, MD;  Location: [facility];  Service: Ophthalmology;  Laterality: Left;  Diabetic - insulin and oral meds  . COLONOSCOPY WITH PROPOFOL N/A 09/23/2014   Procedure: COLONOSCOPY WITH PROPOFOL;  Surgeon: Lollie Sails, MD;  Location[facility] END[facility]COPY;  Service: Endoscopy;  Laterality: N/A;  . COLONOSCOPY WITH PROPOFOL N/A 01/11/2018   Procedure: COLONOSCOPY WITH PROPOFOL;  Surgeon: Lollie Sails, MD;  Location: [facility] ENDOSCOPY;  Service: Endoscopy;  Laterality: N/A;  . CORONARY ANGIOPLASTY WITH STENT PLACEMENT  2008  . ESOPHAGOGASTRODUODENOSCOPY (EGD) WITH PROPOFOL N/A 12/30/2014   Procedure: ESOPHAGOGASTRODUODENOSCOPY (EGD) WITH PROPOFOL;  Surgeon: Lollie Sails, MD;  Location: [facility] ENDOSCOPY;  Service: Endoscopy;  Laterality: N/A;  . ESOPHAGOGASTRODUODENOSCOPY (EGD) WITH PROPOFOL N/A 07/19/2016   Procedure: ESOPHAGOGASTRODUODENOSCOPY (EGD) WITH PROPOFOL;  Surgeon: Lollie Sails, MD;  Location: [facility] ENDOSCOPY;  Service: Endoscopy;  Laterality: N/A;  . hysterectomy (other)     Social History        Tobacco Use  . Smoking status: Current Every Day Smoker    Packs/day: 0.50    Years: 40.00    Pack years:  20.00    Types: Cigarettes  . Smokeless tobacco: Never Used  Substance Use Topics  . Alcohol use: Yes    Comment: 1/month- wine  . Drug use: No     Family History      Family History  Problem Relation Age of Onset  . Heart failure Mother   . Stroke Mother   . Diabetes Mother   . Congestive Heart Failure Mother   . Lung cancer Father   . Breast cancer Maternal Aunt           Allergies  Allergen Reactions  . Sulfonamide Derivatives Other (See Comments)    Last taken as a child; made her mouth break out  . Statins Other (See Comments)    Caused elevated  liver function studies  . Codeine Nausea And Vomiting  . Prednisone Palpitations and Other (See Comments)    Made her legs "feel weird."     REVIEW OF SYSTEMS(Negative unless checked)  Constitutional: [] ?Weight loss[] ?Fever[] ?Chills Cardiac:[] ?Chest pain[] ?Chest pressure[] ?Palpitations [] ?Shortness of breath when laying flat [] ?Shortness of breath at rest [x] ?Shortness of breath with exertion. Vascular: [] ?Pain in legs with walking[] ?Pain in legsat rest[] ?Pain in legs when laying flat [] ?Claudication [] ?Pain in feet when walking [] ?Pain in feet at rest [] ?Pain in feet when laying flat [] ?History of DVT [] ?Phlebitis [] ?Swelling in legs [] ?Varicose veins [] ?Non-healing ulcers Pulmonary: [] ?Uses home oxygen [] ?Productive cough[] ?Hemoptysis [] ?Wheeze [] ?COPD [] ?Asthma Neurologic: [x] ?Dizziness [] ?Blackouts [] ?Seizures [] ?History of stroke [] ?History of TIA[] ?Aphasia [] ?Temporary blindness[] ?Dysphagia [] ?Weaknessor numbness in arms [] ?Weakness or numbnessin legs Musculoskeletal: [x] ?Arthritis [] ?Joint swelling [] ?Joint pain [] ?Low back pain Hematologic:[] ?Easy bruising[] ?Easy bleeding [] ?Hypercoagulable state [] ?Anemic [] ?Hepatitis Gastrointestinal:[] ?Blood in stool[] ?Vomiting blood[] ?Gastroesophageal  reflux/heartburn[x] ?Abdominal pain Genitourinary: [] ?Chronic kidney disease [] ?Difficulturination [] ?Frequenturination [] ?Burning with urination[] ?Hematuria Skin: [] ?Rashes [] ?Ulcers [] ?Wounds Psychological: [] ?History of anxiety[] ?History of major depression.     Physical Examination  BP (!) 184/96 (BP Location: Right Arm, Patient Position: Sitting)   Pulse 72   Resp 12   Ht 5\' 5"  (1.651 m)   Wt 167 lb (75.8 kg)   BMI 27.79 kg/m  Gen:  WD/WN, NAD Head: /AT, No temporalis wasting. Ear/Nose/Throat: Hearing grossly intact, nares w/o erythema or drainage Eyes: Conjunctiva clear. Sclera non-icteric Neck: Supple.  Trachea midline Pulmonary:  Good air movement, no use of accessory muscles.  Cardiac: RRR, no JVD Vascular:  Vessel Right Left  Radial Palpable Palpable                                   Gastrointestinal: soft, non-tender/non-distended. No guarding/reflex.  Musculoskeletal: M/S 5/5 throughout.  No deformity or atrophy.  No significant lower extremity edema. Neurologic: Sensation grossly intact in extremities.  Symmetrical.  Speech is fluent.  Psychiatric: Judgment intact, Mood & affect appropriate for pt's clinical situation. Dermatologic: No rashes or ulcers noted.  No cellulitis or open wounds.       Labs Recent Results (from the past 2160 hour(s))  NM Myocar Multi W/Spect W/Wall Motion / EF     Status: None   Collection Time: 11/27/17 10:11 AM  Result Value Ref Range   Rest HR 76 bpm   Rest BP 160/91 mmHg   Exercise duration (sec) 0 sec   Percent HR 62 %   Exercise duration (min) 0 min   Estimated workload 1.0 METS   Peak HR 93 bpm   Peak BP 129/75 mmHg   MPHR 149 bpm   SSS 0    SRS 1    SDS 0    TID 0.91    LV sys vol 13 mL   LV dias vol 35 46 - 106 mL  HM DIABETES EYE EXAM     Status: None   Collection Time: 12/27/17 12:00 AM  Result Value Ref Range   HM Diabetic Eye Exam No Retinopathy No Retinopathy  Glucose,  capillary     Status: Abnormal   Collection Time: 01/11/18  7:02 AM  Result Value Ref Range   Glucose-Capillary 103 (H) 70 - 99 mg/dL   Comment 1 IN EPIC   Surgical pathology     Status: None   Collection Time: 01/11/18  8:02 AM  Result Value Ref Range   SURGICAL PATHOLOGY      Surgical Pathology CASE: [id number] PATIENT:  Orit Haws Surgical Pathology Report     SPECIMEN SUBMITTED: A. Colon ulceration, splenic flexure; cbx B. Colon polyp x2, ascending; cold snare, cbx C. Colon polyp, transverse; cbx  CLINICAL HISTORY: None provided  PRE-OPERATIVE DIAGNOSIS: HX colon polyps  POST-OPERATIVE DIAGNOSIS: Diverticulosis, solitary ulcer at splenic flexure 3 cm long and 1-2 mm wide, colon polyps, internal hemorrhoids     DIAGNOSIS: A. COLON ULCERATION, SPLENIC FLEXURE; COLD BIOPSY: - CHRONIC ULCER. - ADJACENT MUCOSA WITH MARKED CRYPT HYPERPLASIA AND DISTORTION. - REACTIVE EPITHELIAL CHANGES. - NO INFECTIOUS AGENTS IDENTIFIED. - NO DEFINITE NEOPLASTIC PROCESS IDENTIFIED (ADDITIONAL DEEPER SECTIONS REVIEWED).  Comment: Healing mucosal injury is favored, but correlation with the history and clinical appearance is advised. Short interval follow-up colonoscopy (3-6 months) should be considered to assess healing and to entirely  exclude a neoplasm.  B.  COLON POLYP X 2, ASCENDING; COLD SNARE AND COLD BIOPSY: - TUBULAR ADENOMAS, 3 FRAGMENTS. - NEGATIVE FOR HIGH-GRADE DYSPLASIA AND MALIGNANCY.  C.  COLON POLYP, TRANSVERSE; COLD BIOPSY: - HYPERPLASTIC POLYP. - NEGATIVE FOR DYSPLASIA AND MALIGNANCY.   GROSS DESCRIPTION: A. Labeled: Ulceration at splenic flexure cbx Received: In formalin Tissue fragment(s): Multiple Size: Aggregate, 1.4 x 0.5 x 0.1 cm Description: Pink-tan fragments Entirely submitted in one cassette.  B. Labeled: Ascending colon polyp cold snare and cbx x2 Received: In formalin Tissue fragment(s): 3 Size: 0.2 -0.5 cm Description: Pink-tan  fragments Entirely submitted in one cassette.  C. Labeled: Transverse colon polyp cbx Received: In formalin Tissue fragment(s): 1 Size: 0.3 cm Description: Pink-tan fragment Entirely submitted in one cassette.   Final Diagnosis performed by Bryan Lemma, MD.   Electronically signed 01/15/2018 12:37:45PM The electronic signature  indicates that the named Attending Pathologist has evaluated the specimen  Technical component performed at [facility], [address] [place], [place] [address] Lab: [phone] Dir: Rush Farmer, MD, MMM  Professional component performed at [facility], [facility], [place], [place], [place] [address] Lab: [phone] Dir: Dellia Nims. Reuel Derby, MD     Radiology No results found.  Assessment/Plan Chronic kidney disease, stage 3 Try to avoid contrast with evaluation as much as possible. Plan duplex  Hyperlipidemia lipid control important in reducing the progression of atherosclerotic disease. Continue statin therapy   Smoking We had a discussion for approximately3-32minutes regarding the absolute need for smoking cessation due to the deleterious nature of tobacco on the vascular system. Particularly in her case, it increase likelihood an aneurysmal growth.We discussed the tobacco use would diminish patency of any intervention, and likely significantly worsen progressio of disease. We discussed multiple agents for quitting including replacement therapy or medications to reduce cravings such as Chantix. The patient voices their understanding of the importance of smoking cessation.   Benign essential HTN blood pressure control important in reducing the progression of atherosclerotic diseaseand aneurysmal degeneration. On appropriate oral medications.   Stroke [facility]) Mild residual deficits  AAA (abdominal aortic aneurysm) without rupture (HCC) Her aortic aneurysm today measures 3.4 cm in maximal diameter by duplex.  No  surgery or intervention at this time. The patient has an asymptomatic abdominal aortic aneurysm that is less than 4 cm in maximal diameter.  I have discussed the natural history of abdominal aortic aneurysm and the small risk of rupture for aneurysm less than 5 cm in size.  However, as these small aneurysms tend to enlarge over time, continued surveillance with ultrasound or CT scan is mandatory.  I have also discussed optimizing medical management with hypertension and lipid control and the  importance of abstinence from tobacco.  The patient is also encouraged to exercise a minimum of 30 minutes 4 times a week.  Should the patient develop new onset abdominal or back pain or signs of peripheral embolization they are instructed to seek medical attention immediately and to alert the physician providing care that they have an aneurysm.  The patient voices their understanding. The patient will return in 12 months with an aortic duplex.     Leotis Pain, MD  02/09/2018 10:11 AM    This note was created with Dragon medical transcription system.  Any errors from dictation are purely unintentional

## 2018-02-09 NOTE — Patient Instructions (Signed)
Abdominal Aortic Aneurysm    An aneurysm is a bulge in one of the blood vessels that carry blood away from the heart (artery). It happens when blood pushes up against a weak or damaged place in the wall of an artery. An abdominal aortic aneurysm happens in the main artery of the body (aorta).  Some aneurysms may not cause problems. If it grows, it can burst or tear, causing bleeding inside the body. This is an emergency. It needs to be treated right away.  What are the causes?  The exact cause of this condition is not known.  What increases the risk?  The following may make you more likely to get this condition:   Being a female who is 60 years of age or older.   Being white (Caucasian).   Using tobacco.   Having a family history of aneurysms.   Having the following conditions:  ? Hardening of the arteries (arteriosclerosis).  ? Inflammation of the walls of an artery (arteritis).  ? Certain genetic conditions.  ? Being very overweight (obesity).  ? An infection in the wall of the aorta (infectious aortitis).  ? High cholesterol.  ? High blood pressure (hypertension).  What are the signs or symptoms?  Symptoms depend on the size of the aneurysm and how fast it is growing. Most grow slowly and do not cause any symptoms. If symptoms do occur, they may include:   Pain in the belly (abdomen), side, or back.   Feeling full after eating only small amounts of food.   Feeling a throbbing lump in the belly.  Symptoms that the aneurysm has burst (ruptured) include:   Sudden, very bad pain in the belly, side, or back.   Feeling sick to your stomach (nauseous).   Throwing up (vomiting).   Feeling light-headed or passing out.  How is this treated?  Treatment for this condition depends on:   The size of the aneurysm.   How fast it is growing.   Your age.   Your risk of having it burst.  If your aneurysm is smaller than 2 inches (5 cm), your doctor may manage it by:   Checking it often to see if it is getting bigger.  You may have an imaging test (ultrasound) to check it every 3-6 months, every year, or every few years.   Giving you medicines to:  ? Control blood pressure.  ? Treat pain.  ? Fight infection.  If your aneurysm is larger than 2 inches (5 cm), you may need surgery to fix it.  Follow these instructions at home:  Lifestyle   Do not use any products that have nicotine or tobacco in them. This includes cigarettes, e-cigarettes, and chewing tobacco. If you need help quitting, ask your doctor.   Get regular exercise. Ask your doctor what types of exercise are best for you.  Eating and drinking   Eat a heart-healthy diet. This includes eating plenty of:  ? Fresh fruits and vegetables.  ? Whole grains.  ? Low-fat (lean) protein.  ? Low-fat dairy products.   Avoid foods that are high in saturated fat and cholesterol. These foods include red meat and some dairy products.   Do not drink alcohol if:  ? Your doctor tells you not to drink.  ? You are pregnant, may be pregnant, or are planning to become pregnant.   If you drink alcohol:  ? Limit how much you use to:   0-1 drink a day for women.     0-2 drinks a day for men.  ? Be aware of how much alcohol is in your drink. In the [place] one drink equals any of these:   One typical bottle of beer (12 oz).   One-half glass of wine (5 oz).   One shot of hard liquor (1 oz).  General instructions   Take over-the-counter and prescription medicines only as told by your doctor.   Keep your blood pressure within normal limits. Ask your doctor what your blood pressure should be.   Have your blood sugar (glucose) level and cholesterol levels checked regularly. Keep your blood sugar level and cholesterol levels within normal limits.   Avoid heavy lifting and activities that take a lot of effort. Ask your doctor what activities are safe for you.   Keep all follow-up visits as told by your doctor. This is important.  ? Talk to your doctor about regular screenings to see if the  aneurysm is getting bigger.  Contact a doctor if you:   Have pain in your belly, side, or back.   Have a throbbing feeling in your belly.   Have a family history of aneurysms.  Get help right away if you:   Have sudden, bad pain in your belly, side, or back.   Feel sick to your stomach.   Throw up.   Have trouble pooping (constipation).   Have trouble peeing (urinating).   Feel light-headed.   Have a fast heart rate when you stand.   Have sweaty skin that is cold to the touch (clammy).   Have shortness of breath.   Have a fever.  These symptoms may be an emergency. Do not wait to see if the symptoms will go away. Get medical help right away. Call your local emergency services (911 in the [place] Do not drive yourself to the hospital.  Summary   An aneurysm is a bulge in one of the blood vessels that carry blood away from the heart (artery). Some aneurysms may not cause problems.   You may need to have yours checked often. If it grows, it can burst or tear. This causes bleeding inside the body. It needs to be treated right away.   Follow instructions from your doctor about healthy lifestyle changes.   Keep all follow-up visits as told by your doctor. This is important.  This information is not intended to replace advice given to you by your health care provider. Make sure you discuss any questions you have with your health care provider.  Document Released: 06/04/2012 Document Revised: 09/16/2017 Document Reviewed: 09/16/2017  Elsevier Interactive Patient Education  2019 [employer]

## 2018-02-11 ENCOUNTER — Encounter: Payer: Self-pay | Admitting: Family Medicine

## 2018-02-11 DIAGNOSIS — M48061 Spinal stenosis, lumbar region without neurogenic claudication: Secondary | ICD-10-CM

## 2018-02-12 MED ORDER — HYDROCODONE-ACETAMINOPHEN 5-325 MG PO TABS: 1.0000 | ORAL_TABLET | Freq: Four times a day (QID) | ORAL | 0 refills | Status: DC | PRN

## 2018-02-21 DIAGNOSIS — H33319 Horseshoe tear of retina without detachment, unspecified eye: Secondary | ICD-10-CM

## 2018-02-21 HISTORY — DX: Horseshoe tear of retina without detachment, unspecified eye: H33.319

## 2018-03-05 ENCOUNTER — Encounter: Payer: Self-pay | Admitting: Family Medicine

## 2018-03-06 DIAGNOSIS — R1013 Epigastric pain: Secondary | ICD-10-CM | POA: Diagnosis not present

## 2018-03-06 DIAGNOSIS — K2270 Barrett's esophagus without dysplasia: Secondary | ICD-10-CM | POA: Diagnosis not present

## 2018-03-06 DIAGNOSIS — Z86010 Personal history of colonic polyps: Secondary | ICD-10-CM | POA: Diagnosis not present

## 2018-03-26 ENCOUNTER — Encounter: Payer: Self-pay | Admitting: Family Medicine

## 2018-03-26 ENCOUNTER — Ambulatory Visit (INDEPENDENT_AMBULATORY_CARE_PROVIDER_SITE_OTHER): Payer: Medicare Other | Admitting: Family Medicine

## 2018-03-26 VITALS — BP 160/95 | HR 73 | Temp 97.9°F | Ht 65.0 in | Wt 169.0 lb

## 2018-03-26 DIAGNOSIS — J3089 Other allergic rhinitis: Secondary | ICD-10-CM

## 2018-03-26 DIAGNOSIS — E0821 Diabetes mellitus due to underlying condition with diabetic nephropathy: Secondary | ICD-10-CM

## 2018-03-26 DIAGNOSIS — E2839 Other primary ovarian failure: Secondary | ICD-10-CM | POA: Diagnosis not present

## 2018-03-26 DIAGNOSIS — M48061 Spinal stenosis, lumbar region without neurogenic claudication: Secondary | ICD-10-CM | POA: Diagnosis not present

## 2018-03-26 DIAGNOSIS — I10 Essential (primary) hypertension: Secondary | ICD-10-CM

## 2018-03-26 DIAGNOSIS — F1190 Opioid use, unspecified, uncomplicated: Secondary | ICD-10-CM | POA: Diagnosis not present

## 2018-03-26 MED ORDER — HYDROCODONE-ACETAMINOPHEN 5-325 MG PO TABS: 1.0000 | ORAL_TABLET | Freq: Four times a day (QID) | ORAL | 0 refills | Status: DC | PRN

## 2018-03-26 MED ORDER — NEOMYCIN-POLYMYXIN-HC 3.5-10000-1 OT SOLN: 3.0000 [drp] | Freq: Four times a day (QID) | OTIC | 0 refills | Status: AC

## 2018-03-26 NOTE — Patient Instructions (Signed)
.   Please review the attached list of medications and notify my office if there are any errors.   . Please bring all of your medications to every appointment so we can make sure that our medication list is the same as yours.   

## 2018-03-26 NOTE — Progress Notes (Signed)
Patient: Hannah Kirby Female    DOB: [birth year]   73 y.o.   MRN: [id number] Visit Date: 03/26/2018  Today's Provider: Lelon Huh, MD   Chief Complaint  Patient presents with  . Hypertension  . Sinusitis   Subjective:     Back Pain  This is a chronic problem. The problem is unchanged (Pt is doing well on the current dose of pain medication.). The pain is present in the lumbar spine. The pain does not radiate. Associated symptoms include headaches. Pertinent negatives include no fever.  Is taking hydrocodone/apap 2-3 times a day and remains effective and well tolerating.     Also has chronic allergies and sinus congestion. The problem has been gradually worsening since onset. There has been no fever. Associated symptoms include coughing, headaches, shortness of breath, sinus pressure and sneezing. Pertinent negatives include no chills, congestion, diaphoresis, ear pain or sore throat. Take montelukast intermittently. Drainage is clear. No fevers or chills.    Hypertension, follow-up:  BP Readings from Last 3 Encounters:  03/26/18 (!) 160/95  02/09/18 (!) 184/96  01/11/18 (!) 199/96    She was last seen for hypertension 6 months ago.  BP at that visit was 157/88. Management since that visit includes Dr [name] added doxazosin 1mg  a day.  Pt reports it is causing her to be dizzy especially in the mornings.  She is not cutting the tablet in half and has some improvement with the dizziness but is not back to baseline yet. She reports excellent compliance with treatment. She is having side effects. Dizziness from Doxazosin She is not exercising. She is adherent to low salt diet.   Outside blood pressures are not being checked. She is experiencing Dizziness.  Patient denies chest pain leg swelling.   Cardiovascular risk factors include advanced age (older than 81 for men, 50 for women), diabetes mellitus, dyslipidemia and hypertension.  Use of agents associated with  hypertension: none.     Weight trend: stable Wt Readings from Last 3 Encounters:  03/26/18 169 lb (76.7 kg)  02/09/18 167 lb (75.8 kg)  01/11/18 162 lb (73.5 kg)    Current diet: in general, a "healthy" diet    ------------------------------------------------------------------------     Allergies  Allergen Reactions  . Sulfonamide Derivatives Other (See Comments)    Last taken as a child; made her mouth break out  . Amlodipine     Granuloma annulare  . Statins Other (See Comments)    Caused elevated liver function studies (02/22/17-pt taking rosuvastatin without problems) Caused elevated liver function studies  . Sulfa Antibiotics Other (See Comments)    Mouth blisters Welts on mouth   . Codeine Nausea And Vomiting  . Prednisone Palpitations and Other (See Comments)    Made her legs "feel weird."     Current Outpatient Medications:  .  acetaminophen (TYLENOL) 500 MG tablet, Take 500 mg by mouth every 6 (six) hours as needed., Disp: , Rfl:  .  albuterol (VENTOLIN HFA) 108 (90 Base) MCG/ACT inhaler, Inhale 2 puffs into the lungs every 4 (four) hours as needed for wheezing or shortness of breath., Disp: 48 g, Rfl: 3 .  aspirin (ASPIR-81) 81 MG EC tablet, Take 81 mg by mouth daily.  , Disp: , Rfl:  .  clopidogrel (PLAVIX) 75 MG tablet, Take 1 tablet (75 mg total) by mouth daily., Disp: 90 tablet, Rfl: 3 .  doxazosin (CARDURA) 1 MG tablet, Take 1 tablet (1 mg total) by mouth  daily. (Patient taking differently: Take 0.5 mg by mouth daily. ), Disp: 30 tablet, Rfl: 11 .  ezetimibe (ZETIA) 10 MG tablet, Take 1 tablet (10 mg total) by mouth daily., Disp: 90 tablet, Rfl: 3 .  fluticasone furoate-vilanterol (BREO ELLIPTA) 100-25 MCG/INH AEPB, Inhale 1 puff into the lungs daily., Disp: 84 each, Rfl: 3 .  HYDROcodone-acetaminophen (NORCO/VICODIN) 5-325 MG tablet, Take 1 tablet by mouth every 6 (six) hours as needed., Disp: 120 tablet, Rfl: 0 .  insulin glargine (LANTUS) 100 UNIT/ML  injection, Inject 12 Units into the skin daily. , Disp: , Rfl:  .  Liraglutide (VICTOZA) 18 MG/3ML SOLN injection, Inject 1.8 mg into the skin daily. , Disp: , Rfl:  .  lisinopril (PRINIVIL,ZESTRIL) 40 MG tablet, Take 1 tablet (40 mg total) by mouth daily., Disp: 90 tablet, Rfl: 3 .  meclizine (ANTIVERT) 25 MG tablet, Take by mouth as needed. , Disp: , Rfl:  .  metoprolol tartrate (LOPRESSOR) 25 MG tablet, Take 1 tablet (25 mg total) by mouth 2 (two) times daily., Disp: 180 tablet, Rfl: 3 .  Multiple Vitamins-Minerals (DAILY MULTI) TABS, Take 1 tablet by mouth daily. , Disp: , Rfl:  .  nitroGLYCERIN (NITROSTAT) 0.4 MG SL tablet, Place 1 tablet (0.4 mg total) under the tongue every 5 (five) minutes as needed., Disp: 25 tablet, Rfl: 6 .  Omega-3 Fatty Acids (FISH OIL) 1000 MG CAPS, Take 2 capsules by mouth 2 (two) times daily. Take 4, Disp: , Rfl:  .  omeprazole (PRILOSEC) 40 MG capsule, Take 40 mg by mouth daily as needed., Disp: , Rfl:  .  ondansetron (ZOFRAN ODT) 4 MG disintegrating tablet, Take 1 tablet (4 mg total) by mouth every 8 (eight) hours as needed for nausea or vomiting., Disp: 20 tablet, Rfl: 0 .  rosuvastatin (CRESTOR) 5 MG tablet, Take 1 tablet (5 mg total) by mouth daily., Disp: 90 tablet, Rfl: 3 .  tiotropium (SPIRIVA) 18 MCG inhalation capsule, Place 1 capsule (18 mcg total) into inhaler and inhale daily., Disp: 90 capsule, Rfl: 4 .  chlorhexidine (PERIDEX) 0.12 % solution, Use as directed 15 mLs in the mouth or throat 2 (two) times daily. (Patient taking differently: Use as directed 15 mLs in the mouth or throat 2 (two) times daily. ), Disp: 120 mL, Rfl: 6 .  montelukast (SINGULAIR) 10 MG tablet, Take 1 tablet (10 mg total) by mouth at bedtime. (Patient taking differently: Take 10 mg by mouth as needed. ), Disp: 30 tablet, Rfl: 12 .  RABEprazole (ACIPHEX) 20 MG tablet, Take 20 mg by mouth daily. , Disp: , Rfl:  .  sucralfate (CARAFATE) 1 g tablet, Take 1 g by mouth 2 (two) times  daily. , Disp: , Rfl:   Review of Systems  Constitutional: Negative for activity change, appetite change, chills, diaphoresis, fatigue, fever and unexpected weight change.  HENT: Positive for ear discharge (Left ear.), postnasal drip, sinus pressure, sinus pain and sneezing. Negative for congestion, ear pain, rhinorrhea and sore throat.   Eyes: Negative.   Respiratory: Positive for cough and shortness of breath. Negative for apnea, choking, chest tightness, wheezing and stridor.        History of COPD.  Pt states this is stable.    Cardiovascular: Negative.   Gastrointestinal: Negative.   Musculoskeletal: Positive for back pain.  Allergic/Immunologic: Positive for environmental allergies.  Neurological: Positive for dizziness and headaches. Negative for light-headedness.    Social History   Tobacco Use  . Smoking status: Current Every  Day Smoker    Packs/day: 0.75    Years: 40.00    Pack years: 30.00    Types: Cigarettes  . Smokeless tobacco: Never Used  Substance Use Topics  . Alcohol use: Yes    Comment: 1/month- wine   Patient Care Team    Relationship Specialty Notifications Start End  Birdie Sons, MD PCP - General Family Medicine  10/02/15   Minna Merritts, MD Consulting Physician Cardiology  12/01/15   Estill Cotta, MD Consulting Physician Ophthalmology  08/03/16   Lollie Sails, MD Consulting Physician Gastroenterology  08/03/16   Lavonia Dana, MD Consulting Physician Internal Medicine  08/03/16   Algernon Huxley, MD Referring Physician Vascular Surgery  08/04/17   Lonia Farber, MD Consulting Physician Internal Medicine  03/26/18       Objective:   BP (!) 160/95 (BP Location: Right Arm, Patient Position: Sitting, Cuff Size: Normal)   Pulse 73   Temp 97.9 F (36.6 C) (Oral)   Ht 5\' 5"  (1.651 m)   Wt 169 lb (76.7 kg)   BMI 28.12 kg/m  Vitals:   03/26/18 1045  BP: (!) 160/95  Pulse: 73  Temp: 97.9 F (36.6 C)  TempSrc: Oral  Weight: 169  lb (76.7 kg)  Height: 5\' 5"  (1.651 m)     Physical Exam  General Appearance:    Alert, cooperative, no distress  HENT:   post nasal drip noted and nasal mucosa pale and congested  Eyes:    PERRL, conjunctiva/corneas clear, EOM's intact       Lungs:     Clear to auscultation bilaterally, respirations unlabored  Heart:    Regular rate and rhythm  Neurologic:   Awake, alert, oriented x 3. No apparent focal neurological           defect.          Assessment & Plan    1. Spinal stenosis of lumbar region, unspecified whether neurogenic claudication present  - HYDROcodone-acetaminophen (NORCO/VICODIN) 5-325 MG tablet; Take 1 tablet by mouth every 6 (six) hours as needed.  Dispense: 120 tablet; Refill: 0  2. Chronic, continuous use of opioids Pain well controlled with current regiment. Continue current medications.    3. Estrogen deficiency Due for- DG Bone Density; Future  4. Diabetes mellitus due to underlying condition with diabetic nephropathy, unspecified whether long term insulin use ([place]) She has follow up with Dr. Honor Junes scheduled later this week.   5. Essential hypertension Has been managed by Dr. [name], but not due for follow up until October. Taken off amlodipine in October due to suspected dermatology side effects, but only tolerating 1/2 mg of doxazocin.  Anticipate having labs at endocrinology later this week. Consider trial of hctz or spirolactone.   6. Allergic rhinitis.  No sign of infection at this time. She does have prescription for montelukast which she can start back on.y     Lelon Huh, MD  [place]

## 2018-03-30 DIAGNOSIS — E1121 Type 2 diabetes mellitus with diabetic nephropathy: Secondary | ICD-10-CM | POA: Diagnosis not present

## 2018-03-30 DIAGNOSIS — I10 Essential (primary) hypertension: Secondary | ICD-10-CM | POA: Diagnosis not present

## 2018-03-30 DIAGNOSIS — Z794 Long term (current) use of insulin: Secondary | ICD-10-CM | POA: Diagnosis not present

## 2018-03-30 DIAGNOSIS — E785 Hyperlipidemia, unspecified: Secondary | ICD-10-CM | POA: Diagnosis not present

## 2018-03-30 DIAGNOSIS — E1169 Type 2 diabetes mellitus with other specified complication: Secondary | ICD-10-CM | POA: Diagnosis not present

## 2018-03-30 DIAGNOSIS — E1159 Type 2 diabetes mellitus with other circulatory complications: Secondary | ICD-10-CM | POA: Diagnosis not present

## 2018-03-31 DIAGNOSIS — J101 Influenza due to other identified influenza virus with other respiratory manifestations: Secondary | ICD-10-CM | POA: Diagnosis not present

## 2018-03-31 DIAGNOSIS — R1110 Vomiting, unspecified: Secondary | ICD-10-CM | POA: Diagnosis not present

## 2018-04-05 ENCOUNTER — Telehealth: Payer: Self-pay | Admitting: Family Medicine

## 2018-04-05 NOTE — Telephone Encounter (Signed)
Please advise patient after reviewing labs from Dr. Melynda Ripple office we should start spironolactone 25mg  tablet, 1/2 tablet daily to get blood pressure down. Please send in prescription for #30 with one refill and follow up in a month to check blood pressure.

## 2018-04-05 NOTE — Telephone Encounter (Signed)
-----   Message from Birdie Sons, MD sent at 03/26/2018  1:34 PM EST ----- Regarding: check on labs from [facility] encocrinology. consider hctz or spironolactone for better BP control

## 2018-04-06 MED ORDER — SPIRONOLACTONE 25 MG PO TABS: 12.5000 mg | ORAL_TABLET | Freq: Every day | ORAL | 0 refills | Status: DC

## 2018-04-06 NOTE — Telephone Encounter (Signed)
Pt advised.  Apt made for 05/16/2018 at 1:40pm.  Please review drug interaction with lisinopril, before sending to [facility].   Thanks,   -Mickel Baas

## 2018-04-17 ENCOUNTER — Other Ambulatory Visit: Payer: Medicare Other

## 2018-04-19 ENCOUNTER — Encounter: Payer: Self-pay | Admitting: Family Medicine

## 2018-04-19 ENCOUNTER — Other Ambulatory Visit: Payer: Self-pay | Admitting: Family Medicine

## 2018-04-19 MED ORDER — HYDROCHLOROTHIAZIDE 12.5 MG PO TABS: 12.5000 mg | ORAL_TABLET | Freq: Every day | ORAL | 3 refills | Status: DC

## 2018-04-19 NOTE — Progress Notes (Signed)
mychart message states spironolactone caused her to feel weak and short of breath. Change to hctz.

## 2018-04-20 ENCOUNTER — Ambulatory Visit (INDEPENDENT_AMBULATORY_CARE_PROVIDER_SITE_OTHER): Payer: Medicare Other | Admitting: Physician Assistant

## 2018-04-20 ENCOUNTER — Encounter: Payer: Self-pay | Admitting: Physician Assistant

## 2018-04-20 ENCOUNTER — Ambulatory Visit (INDEPENDENT_AMBULATORY_CARE_PROVIDER_SITE_OTHER): Payer: Medicare Other

## 2018-04-20 VITALS — BP 118/80 | HR 80 | Ht 65.0 in | Wt 168.0 lb

## 2018-04-20 DIAGNOSIS — N183 Chronic kidney disease, stage 3 unspecified: Secondary | ICD-10-CM

## 2018-04-20 DIAGNOSIS — R002 Palpitations: Secondary | ICD-10-CM | POA: Diagnosis not present

## 2018-04-20 DIAGNOSIS — I10 Essential (primary) hypertension: Secondary | ICD-10-CM | POA: Diagnosis not present

## 2018-04-20 DIAGNOSIS — R0600 Dyspnea, unspecified: Secondary | ICD-10-CM | POA: Diagnosis not present

## 2018-04-20 DIAGNOSIS — I714 Abdominal aortic aneurysm, without rupture, unspecified: Secondary | ICD-10-CM

## 2018-04-20 DIAGNOSIS — E785 Hyperlipidemia, unspecified: Secondary | ICD-10-CM | POA: Diagnosis not present

## 2018-04-20 DIAGNOSIS — I2510 Atherosclerotic heart disease of native coronary artery without angina pectoris: Secondary | ICD-10-CM

## 2018-04-20 DIAGNOSIS — J449 Chronic obstructive pulmonary disease, unspecified: Secondary | ICD-10-CM | POA: Diagnosis not present

## 2018-04-20 NOTE — Patient Instructions (Signed)
Medication Instructions:  Your physician recommends that you continue on your current medications as directed. Please refer to the Current Medication list given to you today.  If you need a refill on your cardiac medications before your next appointment, please call your pharmacy.   Lab work: None ordered  If you have labs (blood work) drawn today and your tests are completely normal, you will receive your results only by: Marland Kitchen MyChart Message (if you have MyChart) OR . A paper copy in the mail If you have any lab test that is abnormal or we need to change your treatment, we will call you to review the results.  Testing/Procedures: 1- Echo Echo  Please return to [facility] on ______________ at _______________ AM/PM for an Echocardiogram. Your physician has requested that you have an echocardiogram. Echocardiography is a painless test that uses sound waves to create images of your heart. It provides your doctor with information about the size and shape of your heart and how well your heart's chambers and valves are working. This procedure takes approximately one hour. There are no restrictions for this procedure. Please note; depending on visual quality an IV may need to be placed.   2- Zio  A zio monitor was placed today. It will remain on for 3 days. You will then return monitor and event diary in provided box. It takes 1-2 weeks for report to be downloaded and returned to [place]. We will call you with the results. If monitor falls of or has orange flashing light, please call Zio for further instructions.    Follow-Up: At [facility], you and your health needs are our priority.  As part of our continuing mission to provide you with exceptional heart care, we have created designated Provider Care Teams.  These Care Teams include your primary Cardiologist (physician) and Advanced Practice Providers (APPs -  Physician Assistants and Nurse Practitioners) who all work together to  provide you with the care you need, when you need it. You will need a follow up appointment in 3 months.  You may see Dr. [name] or Christell Faith, PA-C.

## 2018-04-20 NOTE — Progress Notes (Signed)
Cardiology Office Note Date:  04/20/2018  Patient ID:  Hannah Kirby, DOB December 03, 1945, MRN [id number] PCP:  Birdie Sons, MD  Cardiologist:  Dr. [name], MD    Chief Complaint: Palpitations/fatigue  History of Present Illness: Hannah Kirby is a 73 y.o. female with history of CAD status post PCI/BMS x2 to the RCA, diabetes mellitus, ongoing tobacco abuse CKD stage III, prior stroke, granulomatous disease, AAA measuring 3.4 cm by abdominal ultrasound from 01/2018 which was stable when compared to prior exam followed by vascular surgery, COPD, hypertension, hyperlipidemia, colitis, spinal stenosis, Barrett's esophagus with dyspepsia, obesity, and anxiety who presents for evaluation of palpitations and fatigue.  Prior cath in 02/2016 with PCI/BMS x2 to the RCA with remaining coronary arteries showing no significant disease, EF normal.  Follow-up stress test in 07/2014 with no significant ischemia, low risk study.  She was admitted to the hospital in 10/2016 with left upper extremity weakness with MRI showing acute right basal ganglia region infarct.  Carotid artery ultrasound showed 0 to 49% bilateral ICA stenosis.  Echo at that time showed an EF of 60 to 65%, moderate LVH, grade 1 diastolic dysfunction, no significant valvular abnormalities, RV systolic function normal.  She has been maintained on aspirin and Plavix.  She has previously discontinued amlodipine as she felt like this was affecting her granulomatous disease.  She was seen in the ED and 09/2017 for abdominal and chest pain.  CT of the abdomen pelvis showed changes consistent with colitis.  She was most recently seen in the office in 11/2017 and was feeling well.  She had chronic shortness of breath and continued to smoke.  Given new EKG changes noted in the office and recent ED evaluation for chest and abdominal pain as noted above, she underwent nuclear stress test on 11/22/2017 that showed no significant ischemia, EF 55 to 65%, normal  study.  She has since been referred to GI for dyspepsia.  She is scheduled for EGD on 04/24/2018.  She was recently started on spironolactone earlier this month given poorly controlled blood pressure.  Since starting spironolactone, the patient noted increase in palpitations and associated shortness of breath.  In this setting, the patient would have panic attacks leading to significant spikes in her blood pressure.  She last took spironolactone 3 days prior and with the discontinuation of this medication she has noted improvement in her breathing as well as palpitations.  She denies any chest pain, dizziness, presyncope, or syncope.  No lower extremity swelling, abdominal distention, orthopnea, PND, or early satiety.  Symptoms do not feel similar to her angina leading up to her PCI in 2018.  Weight has remained stable.  Both patient and husband note when she is not anxious/panicking her blood pressure is typically well controlled in the 1 teens over 22s to 100s.  Plavix is on hold in preparation for her upcoming EGD and colonoscopy next week.  She remains on aspirin.  She is currently symptom-free.  Labs: 03/2018 - glucose 153, potassium 3.8, serum creatinine 1.4, alkaline phosphatase 143, AST/ALT normal, A1c 6.8, LDL 76, TG 230, TSH normal, WBC 10.2, Hgb 16.7, PLT 225    Past Medical History:  Diagnosis Date  . Anemia   . Arthritis   . CAD (coronary artery disease)    a. cath 02/2006: BMS x 2 to RCA, cath o/w without significant coronary disease; b. nuclear stress test 07/2014: no signs of ischemia, no ekg changes concerning for ischemia, low risk study/normal study  .  Chronic bronchitis ([place])    secondary to cigarette smoking  . Chronic kidney disease (CKD), stage III (moderate) (HCC)   . COPD (chronic obstructive pulmonary disease) ([place])   . Diabetes mellitus   . FHx: allergies   . GERD (gastroesophageal reflux disease)   . Goiter   . Granulomatous disease ([place])   . Hernia   . Hyperlipidemia     . Hypertension   . Kidney stone on left side 2013  . Microalbuminuria   . Obesity   . Smokers' cough ([place])   . Spinal stenosis   . Stroke ([place]) 10/29/2016   mild left side weakness    Past Surgical History:  Procedure Laterality Date  . ABDOMINAL HYSTERECTOMY    . BREAST BIOPSY    . CATARACT EXTRACTION W/PHACO Right 03/01/2017   Procedure: CATARACT EXTRACTION PHACO AND INTRAOCULAR LENS PLACEMENT ([place]) RIGHT DIABETIC;  Surgeon: Leandrew Koyanagi, MD;  Location: [place];  Service: Ophthalmology;  Laterality: Right;  . CATARACT EXTRACTION W/PHACO Left 03/22/2017   Procedure: CATARACT EXTRACTION PHACO AND INTRAOCULAR LENS PLACEMENT ([place]) LEFT DIABETIC;  Surgeon: Leandrew Koyanagi, MD;  Location: [place];  Service: Ophthalmology;  Laterality: Left;  Diabetic - insulin and oral meds  . COLONOSCOPY WITH PROPOFOL N/A 09/23/2014   Procedure: COLONOSCOPY WITH PROPOFOL;  Surgeon: Lollie Sails, MD;  Location[facility] ENDOSCOPY;  Service: Endoscopy;  Laterality: N/A;  . COLONOSCOPY WITH PROPOFOL N/A 01/11/2018   Procedure: COLONOSCOPY WITH PROPOFOL;  Surgeon: Lollie Sails, MD;  Location: [facility] ENDOSCOPY;  Service: Endoscopy;  Laterality: N/A;  . CORONARY ANGIOPLASTY WITH STENT PLACEMENT  2008  . ESOPHAGOGASTRODUODENOSCOPY (EGD) WITH PROPOFOL N/A 12/30/2014   Procedure: ESOPHAGOGASTRODUODENOSCOPY (EGD) WITH PROPOFOL;  Surgeon: Lollie Sails, MD;  Location: [facility] ENDOSCOPY;  Service: Endoscopy;  Laterality: N/A;  . ESOPHAGOGASTRODUODENOSCOPY (EGD) WITH PROPOFOL N/A 07/19/2016   Procedure: ESOPHAGOGASTRODUODENOSCOPY (EGD) WITH PROPOFOL;  Surgeon: Lollie Sails, MD;  Location: [facility] ENDOSCOPY;  Service: Endoscopy;  Laterality: N/A;  . hysterectomy (other)      Current Meds  Medication Sig  . acetaminophen (TYLENOL) 500 MG tablet Take 500 mg by mouth every 6 (six) hours as needed.  Marland Kitchen albuterol (VENTOLIN HFA) 108 (90 Base) MCG/ACT inhaler Inhale 2 puffs into the  lungs every 4 (four) hours as needed for wheezing or shortness of breath.  Marland Kitchen aspirin (ASPIR-81) 81 MG EC tablet Take 81 mg by mouth daily.    . chlorhexidine (PERIDEX) 0.12 % solution Use as directed 15 mLs in the mouth or throat 2 (two) times daily. (Patient taking differently: Use as directed 15 mLs in the mouth or throat 2 (two) times daily. )  . clopidogrel (PLAVIX) 75 MG tablet Take 1 tablet (75 mg total) by mouth daily.  Marland Kitchen doxazosin (CARDURA) 1 MG tablet Take 1 tablet (1 mg total) by mouth daily. (Patient taking differently: Take 0.5 mg by mouth daily. )  . ezetimibe (ZETIA) 10 MG tablet Take 1 tablet (10 mg total) by mouth daily.  . fluticasone furoate-vilanterol (BREO ELLIPTA) 100-25 MCG/INH AEPB Inhale 1 puff into the lungs daily.  . hydrochlorothiazide (HYDRODIURIL) 12.5 MG tablet Take 1 tablet (12.5 mg total) by mouth daily.  Marland Kitchen HYDROcodone-acetaminophen (NORCO/VICODIN) 5-325 MG tablet Take 1 tablet by mouth every 6 (six) hours as needed.  . insulin glargine (LANTUS) 100 UNIT/ML injection Inject 12 Units into the skin daily.   . Liraglutide (VICTOZA) 18 MG/3ML SOLN injection Inject 1.8 mg into the skin daily.   Marland Kitchen lisinopril (PRINIVIL,ZESTRIL) 40  MG tablet Take 1 tablet (40 mg total) by mouth daily.  . meclizine (ANTIVERT) 25 MG tablet Take by mouth as needed.   . metoprolol tartrate (LOPRESSOR) 25 MG tablet Take 1 tablet (25 mg total) by mouth 2 (two) times daily.  . Multiple Vitamins-Minerals (DAILY MULTI) TABS Take 1 tablet by mouth daily.   . nitroGLYCERIN (NITROSTAT) 0.4 MG SL tablet Place 1 tablet (0.4 mg total) under the tongue every 5 (five) minutes as needed.  . Omega-3 Fatty Acids (FISH OIL) 1000 MG CAPS Take 2 capsules by mouth 2 (two) times daily. Take 4  . omeprazole (PRILOSEC) 40 MG capsule Take 40 mg by mouth daily as needed.  . ondansetron (ZOFRAN ODT) 4 MG disintegrating tablet Take 1 tablet (4 mg total) by mouth every 8 (eight) hours as needed for nausea or vomiting.  .  rosuvastatin (CRESTOR) 5 MG tablet Take 1 tablet (5 mg total) by mouth daily.  Marland Kitchen tiotropium (SPIRIVA) 18 MCG inhalation capsule Place 1 capsule (18 mcg total) into inhaler and inhale daily.    Allergies:   Sulfonamide derivatives; Amlodipine; Statins; Sulfa antibiotics; Codeine; and Prednisone   Social History:  The patient  reports that she has been smoking cigarettes. She has a 30.00 pack-year smoking history. She has never used smokeless tobacco. She reports current alcohol use. She reports that she does not use drugs.   Family History:  The patient's family history includes Breast cancer in her maternal aunt; Congestive Heart Failure in her mother; Diabetes in her mother; Heart failure in her mother; Lung cancer in her father; Stroke in her mother.  ROS:   Review of Systems  Constitutional: Positive for malaise/fatigue. Negative for chills, diaphoresis, fever and weight loss.  HENT: Negative for congestion.   Eyes: Negative for discharge and redness.  Respiratory: Positive for shortness of breath. Negative for cough, hemoptysis, sputum production and wheezing.   Cardiovascular: Positive for palpitations. Negative for chest pain, orthopnea, claudication, leg swelling and PND.  Gastrointestinal: Positive for heartburn. Negative for abdominal pain, blood in stool, constipation, diarrhea, melena, nausea and vomiting.  Genitourinary: Negative for hematuria.  Musculoskeletal: Negative for falls and myalgias.  Skin: Negative for rash.  Neurological: Positive for weakness. Negative for dizziness, tingling, tremors, sensory change, speech change, focal weakness and loss of consciousness.  Endo/Heme/Allergies: Does not bruise/bleed easily.  Psychiatric/Behavioral: Negative for substance abuse. The patient is nervous/anxious.   All other systems reviewed and are negative.    PHYSICAL EXAM:  VS:  BP 118/80 (BP Location: Left Arm, Patient Position: Sitting, Cuff Size: Normal)   Pulse 80   Ht 5'  5" (1.651 m)   Wt 168 lb (76.2 kg)   BMI 27.96 kg/m  BMI: Body mass index is 27.96 kg/m.  Physical Exam  Constitutional: She is oriented to person, place, and time. She appears well-developed and well-nourished.  HENT:  Head: Normocephalic and atraumatic.  Eyes: Right eye exhibits no discharge. Left eye exhibits no discharge.  Neck: Normal range of motion. No JVD present.  Cardiovascular: Normal rate, regular rhythm, S1 normal, S2 normal and normal heart sounds. Exam reveals no distant heart sounds, no friction rub, no midsystolic click and no opening snap.  No murmur heard. Pulses:      Posterior tibial pulses are 2+ on the right side and 2+ on the left side.  Pulmonary/Chest: Effort normal and breath sounds normal. No respiratory distress. She has no decreased breath sounds. She has no wheezes. She has no rales. She exhibits  no tenderness.  Mildly coarse breath sounds bilaterally  Abdominal: Soft. She exhibits no distension. There is no abdominal tenderness.  Musculoskeletal:        General: No edema.  Neurological: She is alert and oriented to person, place, and time.  Skin: Skin is warm and dry. No cyanosis. Nails show no clubbing.  Psychiatric: She has a normal mood and affect. Her speech is normal and behavior is normal. Judgment and thought content normal.     EKG:  Was ordered and interpreted by me today. Shows NSR, 80 bpm, prior inferior infarct, T wave inversion leads I, aVL, V3, V4, V5, V6  Recent Labs: 10/17/2017: ALT 25; BUN 28; Creatinine, Ser 1.44; Hemoglobin 17.2; Platelets 223; Potassium 3.7; Sodium 138  No results found for requested labs within last 8760 hours.   CrCl cannot be calculated (Patient's most recent lab result is older than the maximum 21 days allowed.).   Wt Readings from Last 3 Encounters:  04/20/18 168 lb (76.2 kg)  03/26/18 169 lb (76.7 kg)  02/09/18 167 lb (75.8 kg)     Other studies reviewed: Additional studies/records reviewed today  include: summarized above  ASSESSMENT AND PLAN:  1. Palpitations/dyspnea: Seems to be improving following discontinuation of spironolactone.  Schedule ZIO monitor (patient preferred only 3 days) as well as echocardiogram to evaluate LV systolic function, valvular function, and pulmonary arterial pressure.  If echo shows a new cardiomyopathy would likely plan for further ischemic evaluation.  2. CAD involving the native coronary arteries status post PCI without angina: No chest pain.  Symptoms do not feel similar to her prior angina.  Recent normal nuclear stress test in 11/2017.  Plavix is on hold in preparation for upcoming EGD and colonoscopy.  She remains on aspirin.  Resumption of Plavix should be performed as soon as safely possible at the discretion of her gastroenterologist.  Otherwise, she will remain on metoprolol, lisinopril, Crestor, and Zetia.  3. AAA: Stable by recent ultrasound.  Followed by vascular surgery.  4. CKD stage III: Renal function at her approximate baseline on recent check earlier this month.  5. Hypertension: Blood pressure is well controlled today.  Given her underlying CKD and symptoms associated with spironolactone, she has discontinued this medication.  Given her comorbid conditions and intolerance of this medication I agree with this.  She has been prescribed HCTZ though has not started this.  Should she start this medication, she will need close monitoring of her renal function to ensure this does not lead to significant progression of her underlying kidney disease or hypokalemia.  She will remain on lisinopril and metoprolol without change today given well-controlled blood pressure.  6. Hyperlipidemia: LDL of 76 from 03/2018.  Goal LDL less than 70.  Heart healthy diet recommended.  If LDL remains elevated in follow-up would recommend escalation of Crestor.  7. COPD: Likely contributing to above symptoms.  Managed by PCP.  Disposition: F/u with Dr. [name] or an APP  in 3 months.  Current medicines are reviewed at length with the patient today.  The patient did not have any concerns regarding medicines.  Signed, Christell Faith, PA-C 04/20/2018 10:11 AM     [place] [address] [place] [address] [place], [place] [address] [phone]

## 2018-04-23 ENCOUNTER — Encounter: Payer: Self-pay | Admitting: Student

## 2018-04-23 ENCOUNTER — Other Ambulatory Visit: Payer: Self-pay | Admitting: *Deleted

## 2018-04-23 MED ORDER — LISINOPRIL 40 MG PO TABS: 40.0000 mg | ORAL_TABLET | Freq: Every day | ORAL | 1 refills | Status: DC

## 2018-04-24 ENCOUNTER — Encounter
Admission: RE | Disposition: A | Discharge status: Home / Self Care | Payer: Self-pay | Source: Home / Self Care | Attending: Gastroenterology

## 2018-04-24 ENCOUNTER — Ambulatory Visit
Admission: RE | Admit: 2018-04-24 | Discharge: 2018-04-24 | Disposition: A | Discharge status: Home / Self Care | Payer: Medicare Other | Attending: Gastroenterology | Admitting: Gastroenterology

## 2018-04-24 ENCOUNTER — Ambulatory Visit: Payer: Medicare Other | Admitting: Anesthesiology

## 2018-04-24 DIAGNOSIS — K449 Diaphragmatic hernia without obstruction or gangrene: Secondary | ICD-10-CM | POA: Diagnosis not present

## 2018-04-24 DIAGNOSIS — K635 Polyp of colon: Secondary | ICD-10-CM | POA: Insufficient documentation

## 2018-04-24 DIAGNOSIS — B3781 Candidal esophagitis: Secondary | ICD-10-CM | POA: Diagnosis not present

## 2018-04-24 DIAGNOSIS — Z79899 Other long term (current) drug therapy: Secondary | ICD-10-CM | POA: Diagnosis not present

## 2018-04-24 DIAGNOSIS — E049 Nontoxic goiter, unspecified: Secondary | ICD-10-CM | POA: Diagnosis not present

## 2018-04-24 DIAGNOSIS — Z1211 Encounter for screening for malignant neoplasm of colon: Secondary | ICD-10-CM | POA: Insufficient documentation

## 2018-04-24 DIAGNOSIS — K2270 Barrett's esophagus without dysplasia: Secondary | ICD-10-CM | POA: Diagnosis not present

## 2018-04-24 DIAGNOSIS — N183 Chronic kidney disease, stage 3 (moderate): Secondary | ICD-10-CM | POA: Diagnosis not present

## 2018-04-24 DIAGNOSIS — J410 Simple chronic bronchitis: Secondary | ICD-10-CM | POA: Insufficient documentation

## 2018-04-24 DIAGNOSIS — M4800 Spinal stenosis, site unspecified: Secondary | ICD-10-CM | POA: Insufficient documentation

## 2018-04-24 DIAGNOSIS — Z86010 Personal history of colonic polyps: Secondary | ICD-10-CM | POA: Insufficient documentation

## 2018-04-24 DIAGNOSIS — Z882 Allergy status to sulfonamides status: Secondary | ICD-10-CM | POA: Diagnosis not present

## 2018-04-24 DIAGNOSIS — K3189 Other diseases of stomach and duodenum: Secondary | ICD-10-CM | POA: Diagnosis not present

## 2018-04-24 DIAGNOSIS — R002 Palpitations: Secondary | ICD-10-CM

## 2018-04-24 DIAGNOSIS — Z888 Allergy status to other drugs, medicaments and biological substances status: Secondary | ICD-10-CM | POA: Insufficient documentation

## 2018-04-24 DIAGNOSIS — I2510 Atherosclerotic heart disease of native coronary artery without angina pectoris: Secondary | ICD-10-CM | POA: Diagnosis not present

## 2018-04-24 DIAGNOSIS — Z794 Long term (current) use of insulin: Secondary | ICD-10-CM | POA: Diagnosis not present

## 2018-04-24 DIAGNOSIS — K2960 Other gastritis without bleeding: Secondary | ICD-10-CM | POA: Diagnosis not present

## 2018-04-24 DIAGNOSIS — Z7951 Long term (current) use of inhaled steroids: Secondary | ICD-10-CM | POA: Insufficient documentation

## 2018-04-24 DIAGNOSIS — I129 Hypertensive chronic kidney disease with stage 1 through stage 4 chronic kidney disease, or unspecified chronic kidney disease: Secondary | ICD-10-CM | POA: Insufficient documentation

## 2018-04-24 DIAGNOSIS — B379 Candidiasis, unspecified: Secondary | ICD-10-CM | POA: Diagnosis not present

## 2018-04-24 DIAGNOSIS — Z8719 Personal history of other diseases of the digestive system: Secondary | ICD-10-CM | POA: Diagnosis not present

## 2018-04-24 DIAGNOSIS — J449 Chronic obstructive pulmonary disease, unspecified: Secondary | ICD-10-CM | POA: Insufficient documentation

## 2018-04-24 DIAGNOSIS — D123 Benign neoplasm of transverse colon: Secondary | ICD-10-CM | POA: Diagnosis not present

## 2018-04-24 DIAGNOSIS — K219 Gastro-esophageal reflux disease without esophagitis: Secondary | ICD-10-CM | POA: Insufficient documentation

## 2018-04-24 DIAGNOSIS — Z7982 Long term (current) use of aspirin: Secondary | ICD-10-CM | POA: Diagnosis not present

## 2018-04-24 DIAGNOSIS — E1122 Type 2 diabetes mellitus with diabetic chronic kidney disease: Secondary | ICD-10-CM | POA: Diagnosis not present

## 2018-04-24 DIAGNOSIS — K319 Disease of stomach and duodenum, unspecified: Secondary | ICD-10-CM | POA: Insufficient documentation

## 2018-04-24 DIAGNOSIS — Z8249 Family history of ischemic heart disease and other diseases of the circulatory system: Secondary | ICD-10-CM | POA: Insufficient documentation

## 2018-04-24 DIAGNOSIS — M1990 Unspecified osteoarthritis, unspecified site: Secondary | ICD-10-CM | POA: Insufficient documentation

## 2018-04-24 DIAGNOSIS — K5790 Diverticulosis of intestine, part unspecified, without perforation or abscess without bleeding: Secondary | ICD-10-CM | POA: Diagnosis not present

## 2018-04-24 DIAGNOSIS — E785 Hyperlipidemia, unspecified: Secondary | ICD-10-CM | POA: Diagnosis not present

## 2018-04-24 DIAGNOSIS — Z885 Allergy status to narcotic agent status: Secondary | ICD-10-CM | POA: Diagnosis not present

## 2018-04-24 DIAGNOSIS — K2970 Gastritis, unspecified, without bleeding: Secondary | ICD-10-CM | POA: Diagnosis not present

## 2018-04-24 DIAGNOSIS — Z7902 Long term (current) use of antithrombotics/antiplatelets: Secondary | ICD-10-CM | POA: Diagnosis not present

## 2018-04-24 DIAGNOSIS — I10 Essential (primary) hypertension: Secondary | ICD-10-CM

## 2018-04-24 DIAGNOSIS — I69354 Hemiplegia and hemiparesis following cerebral infarction affecting left non-dominant side: Secondary | ICD-10-CM | POA: Insufficient documentation

## 2018-04-24 HISTORY — PX: ESOPHAGOGASTRODUODENOSCOPY (EGD) WITH PROPOFOL: SHX5813

## 2018-04-24 HISTORY — PX: COLONOSCOPY WITH PROPOFOL: SHX5780

## 2018-04-24 LAB — KOH PREP

## 2018-04-24 LAB — GLUCOSE, CAPILLARY: Glucose-Capillary: 124 mg/dL — ABNORMAL HIGH (ref 70–99)

## 2018-04-24 SURGERY — ESOPHAGOGASTRODUODENOSCOPY (EGD) WITH PROPOFOL
Anesthesia: General

## 2018-04-24 MED ORDER — LIDOCAINE HCL (PF) 2 % IJ SOLN
INTRAMUSCULAR | Status: AC
  Filled 2018-04-24: qty 10

## 2018-04-24 MED ORDER — LIDOCAINE HCL (CARDIAC) PF 100 MG/5ML IV SOSY
PREFILLED_SYRINGE | INTRAVENOUS | Status: DC | PRN
  Administered 2018-04-24: 80 mg via INTRAVENOUS

## 2018-04-24 MED ORDER — SPOT INK MARKER SYRINGE KIT
PACK | SUBMUCOSAL | Status: DC | PRN
  Administered 2018-04-24: 2 mL via SUBMUCOSAL

## 2018-04-24 MED ORDER — PROPOFOL 10 MG/ML IV BOLUS
INTRAVENOUS | Status: AC
  Filled 2018-04-24: qty 20

## 2018-04-24 MED ORDER — PROPOFOL 500 MG/50ML IV EMUL
INTRAVENOUS | Status: AC
  Filled 2018-04-24: qty 50

## 2018-04-24 MED ORDER — FENTANYL CITRATE (PF) 100 MCG/2ML IJ SOLN
INTRAMUSCULAR | Status: DC | PRN
  Administered 2018-04-24: 50 ug via INTRAVENOUS

## 2018-04-24 MED ORDER — PROPOFOL 500 MG/50ML IV EMUL
INTRAVENOUS | Status: DC | PRN
  Administered 2018-04-24: 180 ug/kg/min via INTRAVENOUS

## 2018-04-24 MED ORDER — FENTANYL CITRATE (PF) 100 MCG/2ML IJ SOLN
INTRAMUSCULAR | Status: AC
  Filled 2018-04-24: qty 2

## 2018-04-24 MED ORDER — SODIUM CHLORIDE 0.9 % IV SOLN
INTRAVENOUS | Status: DC
  Administered 2018-04-24: 09:00:00 via INTRAVENOUS

## 2018-04-24 MED ORDER — SODIUM CHLORIDE 0.9 % IV SOLN
INTRAVENOUS | Status: DC
  Administered 2018-04-24: 08:00:00 via INTRAVENOUS

## 2018-04-24 MED ORDER — SODIUM CHLORIDE (PF) 0.9 % IJ SOLN
INTRAMUSCULAR | Status: DC | PRN
  Administered 2018-04-24: 5 mL via INTRAVENOUS

## 2018-04-24 MED ORDER — PROPOFOL 10 MG/ML IV BOLUS
INTRAVENOUS | Status: DC | PRN
  Administered 2018-04-24: 20 mg via INTRAVENOUS
  Administered 2018-04-24: 10 mg via INTRAVENOUS
  Administered 2018-04-24: 40 mg via INTRAVENOUS

## 2018-04-24 NOTE — Anesthesia Postprocedure Evaluation (Signed)
Anesthesia Post Note  Patient: Hannah Kirby  Procedure(s) Performed: ESOPHAGOGASTRODUODENOSCOPY (EGD) WITH PROPOFOL (N/A ) COLONOSCOPY WITH PROPOFOL (N/A )  Patient location during evaluation: Endoscopy Anesthesia Type: General Level of consciousness: awake and alert and oriented Pain management: pain level controlled Vital Signs Assessment: post-procedure vital signs reviewed and stable Respiratory status: spontaneous breathing, nonlabored ventilation and respiratory function stable Cardiovascular status: blood pressure returned to baseline and stable Postop Assessment: no signs of nausea or vomiting Anesthetic complications: no     Last Vitals:  Vitals:   04/24/18 1106 04/24/18 1116  BP: (!) 163/77 (!) 180/90  Pulse:    Resp:    Temp:    SpO2:      Last Pain:  Vitals:   04/24/18 1116  TempSrc:   PainSc: 0-No pain                 [name]

## 2018-04-24 NOTE — H&P (Signed)
Outpatient short stay form Pre-procedure 04/24/2018 8:29 AM Lollie Sails MD  Primary Physician: Lelon Huh, MD  Reason for visit: EGD and colonoscopy  History of present illness: Patient is a 73 year old female presenting today for EGD and colonoscopy in regards to her personal history of Barrett's esophagus and history of adenomatous colon polyps.  However she had her last colonoscopy on 01/11/2018.  At that time she was found to have a ulceration at the splenic flexure.  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  She is presenting today for that evaluation as well.  Takes Plavix and has held that for 6 days.  She does take a daily 81 mg aspirin as well and is held that for a day.  Takes no other blood thinning agent or aspirin product.    Current Facility-Administered Medications:  .  0.9 %  sodium chloride infusion, , Intravenous, Continuous, Lollie Sails, MD .  0.9 %  sodium chloride infusion, , Intravenous, Continuous, Lollie Sails, MD, Last Rate: 20 mL/hr at 04/24/18 1761  Medications Prior to Admission  Medication Sig Dispense Refill Last Dose  . acetaminophen (TYLENOL) 500 MG tablet Take 500 mg by mouth every 6 (six) hours as needed.   Past Week at Unknown time  . acyclovir (ZOVIRAX) 400 MG tablet Take 400 mg by mouth 5 (five) times daily.     Marland Kitchen albuterol (VENTOLIN HFA) 108 (90 Base) MCG/ACT inhaler Inhale 2 puffs into the lungs every 4 (four) hours as needed for wheezing or shortness of breath. 48 g 3 04/24/2018 at Unknown time  . chlorhexidine (PERIDEX) 0.12 % solution Use as directed 15 mLs in the mouth or throat 2 (two) times daily. (Patient taking differently: Use as directed 15 mLs in the mouth or throat 2 (two) times daily. ) 120 mL 6 Past Month at Unknown time  . cyanocobalamin 1000 MCG tablet Take 1,000 mcg by mouth daily.   Past Week at Unknown time  . doxazosin (CARDURA) 1 MG tablet  Take 1 tablet (1 mg total) by mouth daily. (Patient taking differently: Take 0.5 mg by mouth daily. ) 30 tablet 11 04/23/2018 at Unknown time  . ezetimibe (ZETIA) 10 MG tablet Take 1 tablet (10 mg total) by mouth daily. 90 tablet 3 04/23/2018 at Unknown time  . fluticasone furoate-vilanterol (BREO ELLIPTA) 100-25 MCG/INH AEPB Inhale 1 puff into the lungs daily. 84 each 3 04/23/2018 at Unknown time  . hydrochlorothiazide (HYDRODIURIL) 12.5 MG tablet Take 1 tablet (12.5 mg total) by mouth daily. 30 tablet 3 Past Week at Unknown time  . HYDROcodone-acetaminophen (NORCO/VICODIN) 5-325 MG tablet Take 1 tablet by mouth every 6 (six) hours as needed. 120 tablet 0 Past Week at Unknown time  . insulin glargine (LANTUS) 100 UNIT/ML injection Inject 12 Units into the skin daily.    04/23/2018 at Unknown time  . Liraglutide (VICTOZA) 18 MG/3ML SOLN injection Inject 1.8 mg into the skin daily.    04/23/2018 at Unknown time  . lisinopril (PRINIVIL,ZESTRIL) 40 MG tablet Take 1 tablet (40 mg total) by mouth daily. 30 tablet 1 04/24/2018 at Unknown time  . meclizine (ANTIVERT) 25 MG tablet Take by mouth as needed.    Past Month at Unknown time  . metoprolol tartrate (LOPRESSOR) 25 MG tablet Take 1 tablet (25 mg total) by mouth 2 (two) times daily. 180 tablet 3 04/24/2018 at Unknown time  . Multiple Vitamins-Minerals (DAILY MULTI)  TABS Take 1 tablet by mouth daily.    Past Week at Unknown time  . Omega-3 Fatty Acids (FISH OIL) 1000 MG CAPS Take 2 capsules by mouth 2 (two) times daily. Take 4   Past Week at Unknown time  . RABEprazole (ACIPHEX) 20 MG tablet Take 20 mg by mouth daily.     . sucralfate (CARAFATE) 1 g tablet Take 1 g by mouth 2 (two) times daily.     Marland Kitchen tiotropium (SPIRIVA) 18 MCG inhalation capsule Place 1 capsule (18 mcg total) into inhaler and inhale daily. 90 capsule 4 04/23/2018 at Unknown time  . aspirin (ASPIR-81) 81 MG EC tablet Take 81 mg by mouth daily.     04/22/2018  . clopidogrel (PLAVIX) 75 MG tablet Take 1  tablet (75 mg total) by mouth daily. 90 tablet 3 04/18/2018  . montelukast (SINGULAIR) 10 MG tablet Take 1 tablet (10 mg total) by mouth at bedtime. (Patient taking differently: Take 10 mg by mouth as needed. ) 30 tablet 12 Taking  . nitroGLYCERIN (NITROSTAT) 0.4 MG SL tablet Place 1 tablet (0.4 mg total) under the tongue every 5 (five) minutes as needed. 25 tablet 6 Taking  . omeprazole (PRILOSEC) 40 MG capsule Take 40 mg by mouth daily as needed.   Taking  . ondansetron (ZOFRAN ODT) 4 MG disintegrating tablet Take 1 tablet (4 mg total) by mouth every 8 (eight) hours as needed for nausea or vomiting. 20 tablet 0 Taking  . RABEprazole (ACIPHEX) 20 MG tablet Take 20 mg by mouth daily.    Taking  . rosuvastatin (CRESTOR) 5 MG tablet Take 1 tablet (5 mg total) by mouth daily. 90 tablet 3 Taking  . sucralfate (CARAFATE) 1 g tablet Take 1 g by mouth 2 (two) times daily.    Taking     Allergies  Allergen Reactions  . Sulfonamide Derivatives Other (See Comments)    Last taken as a child; made her mouth break out  . Amlodipine     Granuloma annulare  . Statins Other (See Comments)    Caused elevated liver function studies (02/22/17-pt taking rosuvastatin without problems) Caused elevated liver function studies  . Sulfa Antibiotics Other (See Comments)    Mouth blisters Welts on mouth   . Codeine Nausea And Vomiting  . Prednisone Palpitations and Other (See Comments)    Made her legs "feel weird."     Past Medical History:  Diagnosis Date  . Anemia   . Arthritis   . CAD (coronary artery disease)    a. cath 02/2006: BMS x 2 to RCA, cath o/w without significant coronary disease; b. nuclear stress test 07/2014: no signs of ischemia, no ekg changes concerning for ischemia, low risk study/normal study  . Chronic bronchitis ([place])    secondary to cigarette smoking  . Chronic kidney disease (CKD), stage III (moderate) (HCC)   . COPD (chronic obstructive pulmonary disease) ([place])   . Diabetes  mellitus   . FHx: allergies   . GERD (gastroesophageal reflux disease)   . Goiter   . Granulomatous disease ([place])   . Hernia   . Hyperlipidemia   . Hypertension   . Kidney stone on left side 2013  . Microalbuminuria   . Obesity   . Smokers' cough ([place])   . Spinal stenosis   . Stroke ([place]) 10/29/2016   mild left side weakness    Review of systems:      Physical Exam    Heart and lungs: Regular rate and  rhythm without rub or gallop, lungs are bilaterally clear.    HEENT: Normocephalic atraumatic eyes are anicteric    Other:    Pertinant exam for procedure: Soft nontender nondistended bowel sounds positive normoactive, generalized discomfort no masses rebound organomegaly noted.    Planned proceedures: EGD, colonoscopy and indicated procedures. I have discussed the risks benefits and complications of procedures to include not limited to bleeding, infection, perforation and the risk of sedation and the patient wishes to proceed.    Lollie Sails, MD Gastroenterology 04/24/2018  8:29 AM

## 2018-04-24 NOTE — Anesthesia Preprocedure Evaluation (Signed)
Anesthesia Evaluation  Patient identified by MRN, date of birth, ID band Patient awake    Reviewed: Allergy & Precautions, NPO status , Patient's Chart, lab work & pertinent test results  History of Anesthesia Complications Negative for: history of anesthetic complications  Airway Mallampati: III  TM Distance: >3 FB Neck ROM: Full    Dental  (+) Poor Dentition   Pulmonary COPD, Current Smoker,    breath sounds clear to auscultation- rhonchi (-) wheezing      Cardiovascular hypertension, Pt. on medications (-) angina+ CAD and + Cardiac Stents  (-) CABG  Rhythm:Regular Rate:Normal - Systolic murmurs and - Diastolic murmurs    Neuro/Psych neg Seizures CVA (left sided weakness), Residual Symptoms negative psych ROS   GI/Hepatic Neg liver ROS, PUD, GERD  ,  Endo/Other  diabetes, Insulin Dependent  Renal/GU Renal InsufficiencyRenal disease     Musculoskeletal   Abdominal (+) - obese,   Peds  Hematology  (+) anemia ,   Anesthesia Other Findings Past Medical History: No date: Anemia No date: Arthritis No date: CAD (coronary artery disease)     Comment:  a. cath 02/2006: BMS x 2 to RCA, cath o/w without               significant coronary disease; b. 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[place]) No date: Hernia No date: Hyperlipidemia No date: Hypertension 2013: Kidney stone on left side No date: Microalbuminuria No date: Obesity No date: Smokers' cough ([place]) No  date: Spinal stenosis 10/29/2016: Stroke ([place])     Comment:  mild left side weakness   Reproductive/Obstetrics                             Anesthesia Physical Anesthesia Plan  ASA: III  Anesthesia Plan: General   Post-op Pain Management:    Induction: Intravenous  PONV Risk Score and Plan: 1 and Propofol infusion  Airway Management Planned: Natural Airway  Additional Equipment:   Intra-op Plan:   Post-operative Plan:   Informed Consent: I have reviewed the patients History and Physical, chart, labs and discussed the procedure including the risks, benefits and alternatives for the proposed anesthesia with the patient or authorized representative who has indicated his/her understanding and acceptance.     Dental advisory given  Plan Discussed with: CRNA and Anesthesiologist  Anesthesia Plan Comments:         Anesthesia Quick Evaluation

## 2018-04-24 NOTE — Transfer of Care (Signed)
Immediate Anesthesia Transfer of Care Note  Patient: Hannah Kirby  Procedure(s) Performed: ESOPHAGOGASTRODUODENOSCOPY (EGD) WITH PROPOFOL (N/A ) COLONOSCOPY WITH PROPOFOL (N/A )  Patient Location: PACU  Anesthesia Type:General  Level of Consciousness: sedated  Airway & Oxygen Therapy: Patient Spontanous Breathing and Patient connected to nasal cannula oxygen  Post-op Assessment: Report given to RN and Post -op Vital signs reviewed and stable  Post vital signs: Reviewed and stable  Last Vitals:  Vitals Value Taken Time  BP 119/62 04/24/2018 10:48 AM  Temp 36.2 C 04/24/2018 10:46 AM  Pulse 57 04/24/2018 10:51 AM  Resp 14 04/24/2018 10:51 AM  SpO2 100 % 04/24/2018 10:51 AM  Vitals shown include unvalidated device data.  Last Pain:  Vitals:   04/24/18 1046  TempSrc: Tympanic  PainSc: Asleep         Complications: No apparent anesthesia complications

## 2018-04-24 NOTE — Anesthesia Post-op Follow-up Note (Signed)
Anesthesia QCDR form completed.        

## 2018-04-25 ENCOUNTER — Encounter: Payer: Self-pay | Admitting: Gastroenterology

## 2018-04-25 NOTE — Op Note (Signed)
[facility] Gastroenterology Patient Name: Hannah Kirby Procedure Date: 04/24/2018 8:19 AM MRN: [id number] Account #: [id number] Date of Birth: 08-09-1945 Admit Type: Outpatient Age: 73 Room: [facility] ENDO ROOM 3 Gender: Female Note Status: Finalized Procedure:            Colonoscopy Indications:          Personal history of colonic polyps, follow up previous                        abnormal finding Providers:            Lollie Sails, MD Medicines:            Monitored Anesthesia Care Complications:        No immediate complications. Procedure:            Pre-Anesthesia Assessment:                       - ASA Grade Assessment: III - A patient with severe                        systemic disease.                       After obtaining informed consent, the colonoscope was                        passed under direct vision. Throughout the procedure,                        the patient's blood pressure, pulse, and oxygen                        saturations were monitored continuously. The                        Colonoscope was introduced through the anus and                        advanced to the the cecum, identified by appendiceal                        orifice and ileocecal valve. The colonoscopy was                        extremely difficult due to a redundant colon and                        significant looping. Successful completion of the                        procedure was aided by changing the patient to a supine                        position, changing the patient to a prone position,                        using manual pressure, withdrawing and reinserting the                        scope and lavage. The quality of the bowel  preparation                        was fair. Findings:      Two sessile polyps were found in the sigmoid colon. The polyps were 1 to       2 mm in size. These polyps were removed with a cold biopsy forceps.       Resection and  retrieval were complete.      A 4 mm polyp was found in the distal transverse colon. The polyp was       sessile. The polyp was removed with a cold snare. Resection and       retrieval were complete.      A 28 mm polyp was found in the mid transverse colon. The polyp was       carpet-like and sessile. Polypectomy was attempted, initially using a       saline injection-lift technique with a hot snare. Polyp resection was       incomplete with this device. This intervention then required a different       device and polypectomy technique. The polyp was removed with a cold       snare and cold forcep. Resection and retrieval were complete.       Orientation of the lesion was not amenable to clip placement, however       hemostasis was very good, with no active bleeding on completion. Area       was tattooed with an injection of 1-2 mL of [place] ink.      A 2 mm polyp was found in the transverse colon. The polyp was sessile.       The polyp was removed with a cold biopsy forceps. Resection and       retrieval were complete.      The ulcer previously noted in the splenic flexure and marked with a       large tatoo is completely healed, without residual. Impression:           - Preparation of the colon was fair.                       - Two 1 to 2 mm polyps in the sigmoid colon, removed                        with a cold biopsy forceps. Resected and retrieved.                       - One 4 mm polyp in the distal transverse colon,                        removed with a cold snare. Resected and retrieved.                       - One 28 mm polyp in the mid transverse colon, removed                        with a cold snare. Resected and retrieved. Tattooed.                       - One 2 mm polyp in the transverse colon, removed with  a cold biopsy forceps. Resected and retrieved. Recommendation:       - Discharge patient to home.                       - Clear liquid diet for 1  day.                       - Full liquid diet for 2 days, then advance as                        tolerated to low residue diet for 3 days. Procedure Code(s):    --- Professional ---                       [id number], Colonoscopy, flexible; with removal of tumor(s),                        polyp(s), or other lesion(s) by snare technique                       45380, 53, Colonoscopy, flexible; with biopsy, single                        or multiple                       45381, Colonoscopy, flexible; with directed submucosal                        injection(s), any substance CPT copyright 2018 American Medical [employer]. All rights reserved. The codes documented in this report are preliminary and upon coder review may  be revised to meet current compliance requirements. Lollie Sails, MD 04/24/2018 10:55:26 AM This report has been signed electronically. Number of Addenda: 0 Note Initiated On: 04/24/2018 8:19 AM Scope Withdrawal Time: 0 hours 42 minutes 52 seconds  Total Procedure Duration: 1 hour 34 minutes 4 seconds       [facility]

## 2018-04-25 NOTE — Telephone Encounter (Signed)
-----   Message from Rise Mu, PA-C sent at 04/25/2018 11:42 AM EST ----- Please schedule patient for BMP in approximately 1 week.  Diagnosis hypertension.

## 2018-04-25 NOTE — Op Note (Signed)
[facility] Gastroenterology Patient Name: Hannah Kirby Procedure Date: 04/24/2018 8:19 AM MRN: [id number] Account #: [id number] Date of Birth: [birth year] Admit Type: Outpatient Age: 73 Room: [facility] ENDO ROOM 3 Gender: Female Note Status: Finalized Procedure:            Upper GI endoscopy Indications:          Follow-up of Barrett's esophagus Providers:            Lollie Sails, MD Medicines:            Monitored Anesthesia Care Complications:        No immediate complications. Procedure:            Pre-Anesthesia Assessment:                       - ASA Grade Assessment: III - A patient with severe                        systemic disease.                       After obtaining informed consent, the endoscope was                        passed under direct vision. Throughout the procedure,                        the patient's blood pressure, pulse, and oxygen                        saturations were monitored continuously. The Endoscope                        was introduced through the mouth, and advanced to the                        third part of duodenum. The upper GI endoscopy was                        accomplished without difficulty. The patient tolerated                        the procedure well. Findings:      Patchy, white plaques were found in the upper third of the esophagus, in       the middle third of the esophagus and in the lower third of the       esophagus. Cells for cytology were obtained by brushing.      There were esophageal mucosal changes secondary to established       short-segment Barrett's disease present in the distal esophagus. The       maximum longitudinal extent of these mucosal changes was 1 cm in length.       Mucosa was biopsied with a cold forceps for histology in a       quadrant/targeted manner. One specimen bottle was sent to pathology.      Diffuse moderate inflammation characterized by adherent blood,       congestion  (edema) and erythema was found in the gastric body and in the       gastric antrum. Biopsies were taken with a cold forceps for histology.  A small hiatal hernia was found. The Z-line was a variable distance from       incisors; the hiatal hernia was sliding.      Patchy mild mucosal variance characterized by altered texture was found       in the entire duodenum. Biopsies were taken with a cold forceps for       histology.      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 Cells for cytology obtained.                       - Esophageal mucosal changes secondary to established                        short-segment Barrett's disease. Biopsied.                       - Bile gastritis. Biopsied.                       - Small hiatal hernia.                       - Mucosal variant in the duodenum. Biopsied. Recommendation:       - Perform a colonoscopy today.                       - Continue present medications.                       - Return to GI clinic in 3 weeks. await esophageal                        brushings for possible treatment of esophageal                        candidiasis Lollie Sails, MD 04/24/2018 9:03:20 AM This report has been signed electronically. Number of Addenda: 0 Note Initiated On: 04/24/2018 8:19 AM      [facility]

## 2018-04-26 LAB — SURGICAL PATHOLOGY

## 2018-04-27 DIAGNOSIS — R002 Palpitations: Secondary | ICD-10-CM | POA: Diagnosis not present

## 2018-04-30 NOTE — Telephone Encounter (Signed)
Call to Ms. Louth to let her know that we are unable to triage her husband since he is not a patient but urged her to call the main office number to get him scheduled with a provider.   In the meantime if he has any urgent sx have him seek urgent medical care.   I requested that in the meantime if she would work on getting his medical records because we will need that prior to his appt.   Advised pt to call for any further questions or concerns.

## 2018-05-08 ENCOUNTER — Telehealth: Payer: Self-pay | Admitting: Physician Assistant

## 2018-05-08 NOTE — Telephone Encounter (Signed)
Patient has appt in the morning for labs and wants to make sure it is needed.   Please advise

## 2018-05-09 ENCOUNTER — Other Ambulatory Visit: Payer: Self-pay

## 2018-05-09 ENCOUNTER — Other Ambulatory Visit (INDEPENDENT_AMBULATORY_CARE_PROVIDER_SITE_OTHER): Payer: Medicare Other

## 2018-05-09 DIAGNOSIS — R002 Palpitations: Secondary | ICD-10-CM | POA: Diagnosis not present

## 2018-05-09 NOTE — Telephone Encounter (Signed)
The patient came this morning for her lab work.

## 2018-05-10 ENCOUNTER — Telehealth: Payer: Self-pay | Admitting: Physician Assistant

## 2018-05-10 DIAGNOSIS — E876 Hypokalemia: Secondary | ICD-10-CM

## 2018-05-10 DIAGNOSIS — N289 Disorder of kidney and ureter, unspecified: Secondary | ICD-10-CM

## 2018-05-10 LAB — BASIC METABOLIC PANEL
BUN/Creatinine Ratio: 16 (ref 12–28)
BUN: 24 mg/dL (ref 8–27)
CO2: 23 mmol/L (ref 20–29)
CREATININE: 1.51 mg/dL — AB (ref 0.57–1.00)
Calcium: 9.4 mg/dL (ref 8.7–10.3)
Chloride: 99 mmol/L (ref 96–106)
GFR calc Af Amer: 40 mL/min/{1.73_m2} — ABNORMAL LOW (ref >59)
GFR calc non Af Amer: 35 mL/min/{1.73_m2} — ABNORMAL LOW (ref >59)
GLUCOSE: 207 mg/dL — AB (ref 65–99)
Potassium: 3.4 mmol/L — ABNORMAL LOW (ref 3.5–5.2)
Sodium: 142 mmol/L (ref 134–144)

## 2018-05-10 MED ORDER — METOPROLOL TARTRATE 25 MG PO TABS: 50.0000 mg | ORAL_TABLET | Freq: Two times a day (BID) | ORAL | Status: DC

## 2018-05-10 NOTE — Telephone Encounter (Signed)
I spoke with the patient regarding her BMP results. She is aware of Hannah Faith, PA's recommendations to: 1) Stop HCTZ 2) Increase lopressor to 50 mg BID 3) recheck a BMP in 1 week  I have advised the patient to keep a log of her BP readings and bring them with her next week when she is her for her echo on 3/27. We will draw her BMP the same day.  She is aware if her BP is not well controlled, we may need to transition her lopressor to an alternative medication.   The patient voices understanding of the above recommendations and is agreeable.  She confirms she has plenty of metoprolol 25 mg tablets at this time. She is agreeable with taking 2 tablets (50 mg) BID of metoprolol.

## 2018-05-10 NOTE — Addendum Note (Signed)
Addended by: Alvis Lemmings C on: 05/10/2018 09:30 AM   Modules accepted: Orders

## 2018-05-10 NOTE — Telephone Encounter (Signed)
Notes recorded by Rise Mu, PA-C on 05/10/2018 at 7:14 AM EDT Random glucose is elevated - known DM Renal function remains mildly elevated Potassium is slightly low  I would like for her to stop HCTZ given renal function Increase Lopressor to 50 mg bid, if needed, we could transition her to Coreg in place of metoprolol for added BP effect We should recheck a BMET in 1 week following discontinuation of HCTZ to trend her renal function and potassium

## 2018-05-14 ENCOUNTER — Encounter: Payer: Self-pay | Admitting: Family Medicine

## 2018-05-14 ENCOUNTER — Telehealth: Payer: Self-pay

## 2018-05-14 DIAGNOSIS — M48061 Spinal stenosis, lumbar region without neurogenic claudication: Secondary | ICD-10-CM

## 2018-05-14 MED ORDER — HYDROCODONE-ACETAMINOPHEN 5-325 MG PO TABS: 1.0000 | ORAL_TABLET | Freq: Four times a day (QID) | ORAL | 0 refills | Status: DC | PRN

## 2018-05-14 NOTE — Telephone Encounter (Signed)
I see that the cardiologist changed her BP medication last week. I don't think there is any need to follow up here this week. She  Is supposed to have BMP at the cardiology office this week which she should still have done.

## 2018-05-14 NOTE — Telephone Encounter (Signed)
Patient called wanting to know if she should still come to her appointment on Wednesday, 05/16/2018 @ 1:40 PM. I spoke to the patient about Webex visit and patient stated that she will be able to do a Webex visit. Patient stated that she will be able to take her BP during the visit and show to Dr.Fisher. Please advise.

## 2018-05-14 NOTE — Telephone Encounter (Signed)
Pt advised.  Apt canceled.   Thanks,   -Raziyah Vanvleck  

## 2018-05-16 ENCOUNTER — Ambulatory Visit: Payer: Medicare Other | Admitting: Family Medicine

## 2018-05-17 ENCOUNTER — Other Ambulatory Visit: Payer: Self-pay | Admitting: *Deleted

## 2018-05-17 DIAGNOSIS — I2510 Atherosclerotic heart disease of native coronary artery without angina pectoris: Secondary | ICD-10-CM

## 2018-05-17 DIAGNOSIS — Z79899 Other long term (current) drug therapy: Secondary | ICD-10-CM

## 2018-05-17 DIAGNOSIS — I10 Essential (primary) hypertension: Secondary | ICD-10-CM

## 2018-05-18 ENCOUNTER — Other Ambulatory Visit: Payer: Medicare Other

## 2018-05-18 ENCOUNTER — Other Ambulatory Visit: Payer: Self-pay | Admitting: Physician Assistant

## 2018-05-18 MED ORDER — METOPROLOL TARTRATE 25 MG PO TABS: 75.0000 mg | ORAL_TABLET | Freq: Two times a day (BID) | ORAL | Status: DC

## 2018-05-22 ENCOUNTER — Telehealth: Payer: Self-pay | Admitting: *Deleted

## 2018-05-22 NOTE — Telephone Encounter (Signed)
-----   Message from Rise Mu, PA-C sent at 05/22/2018  7:39 AM EDT ----- Cardiac monitor showed NSR with an average heart rate of 82 bpm.  Isolated PACs and atrial couplets were rare.  Isolated PVCs were occasional representing 2.5% burden with ventricular couplets being rare.  Ventricular bigeminy and trigeminy were noted.  Patient triggered events were associated with PACs and PVCs.  Recent nonischemic Myoview.  Echo pending, which can be deferred if she is feeling well given COVID-19.   Decrease lisinopril to 20 mg daily and increase Lopressor to 100 mg bid.

## 2018-05-22 NOTE — Telephone Encounter (Signed)
No answer. Left message to call back.   

## 2018-05-24 DIAGNOSIS — R1013 Epigastric pain: Secondary | ICD-10-CM | POA: Diagnosis not present

## 2018-05-24 DIAGNOSIS — K2270 Barrett's esophagus without dysplasia: Secondary | ICD-10-CM | POA: Diagnosis not present

## 2018-05-24 DIAGNOSIS — K5900 Constipation, unspecified: Secondary | ICD-10-CM | POA: Diagnosis not present

## 2018-05-24 DIAGNOSIS — B3781 Candidal esophagitis: Secondary | ICD-10-CM | POA: Diagnosis not present

## 2018-05-24 MED ORDER — METOPROLOL TARTRATE 100 MG PO TABS: 100.0000 mg | ORAL_TABLET | Freq: Two times a day (BID) | ORAL | 3 refills | Status: DC

## 2018-05-24 MED ORDER — LISINOPRIL 20 MG PO TABS: 20.0000 mg | ORAL_TABLET | Freq: Every day | ORAL | 3 refills | Status: DC

## 2018-05-24 NOTE — Telephone Encounter (Signed)
Patient returning call.

## 2018-05-24 NOTE — Telephone Encounter (Signed)
Patient's husband answered. Patient is not able to come to the phone right now as she is talking with another doctor on another line.   Asked for her to call back or we will call her later today.

## 2018-05-24 NOTE — Telephone Encounter (Signed)
Called patient and she verbalized understanding of results and plan of care. Verbalized understanding to decrease lisinopril to 20 mg daily and increase metoprolol tartrate to 100 mg two times a day. Rx's sent to verified pharmacy.  Her echo is scheduled for 06/28/18. Advised we will wait until closer to that time to see where the COVID19 crisis is. States she is not having any worsening symptoms and feels well. She thinks some of her issue is her husband's medical concerns.

## 2018-05-29 ENCOUNTER — Other Ambulatory Visit: Payer: Medicare Other

## 2018-06-07 ENCOUNTER — Telehealth: Payer: Self-pay

## 2018-06-07 NOTE — Telephone Encounter (Signed)
Call attempted to reschedule 07/03/18 face to face visit with Dr. [name] to a telephone or video visit due to current clinic policies related to COVID 19 precautions. Left voice mail message requesting for patient to call back to discuss.

## 2018-06-14 ENCOUNTER — Encounter: Payer: Self-pay | Admitting: Family Medicine

## 2018-06-14 DIAGNOSIS — M48061 Spinal stenosis, lumbar region without neurogenic claudication: Secondary | ICD-10-CM

## 2018-06-15 MED ORDER — HYDROCODONE-ACETAMINOPHEN 5-325 MG PO TABS: 1.0000 | ORAL_TABLET | Freq: Four times a day (QID) | ORAL | 0 refills | Status: DC | PRN

## 2018-06-20 ENCOUNTER — Telehealth: Payer: Self-pay

## 2018-06-20 NOTE — Telephone Encounter (Signed)
Virtual Visit Pre-Appointment Phone Call  "Hannah Kirby, I am calling you today to discuss your upcoming appointment. We are currently trying to limit exposure to the virus that causes COVID-19 by seeing patients at home rather than in the office."  1. "What is the BEST phone number to call the day of the visit?" - include this in appointment notes  2. "Do you have or have access to (through a family member/friend) a smartphone with video capability that we can use for your visit?" a. If yes - list this number in appt notes as "cell" (if different from BEST phone #) and list the appointment type as a VIDEO visit in appointment notes b. If no - list the appointment type as a PHONE visit in appointment notes  3. Confirm consent - "In the setting of the current Covid19 crisis, you are scheduled for a (phone or video) visit with your provider on Jul 03, 2018 at 9:40AM.  Just as we do with many in-office visits, in order for you to participate in this visit, we must obtain consent.  If you'd like, I can send this to your mychart (if signed up) or email for you to review.  Otherwise, I can obtain your verbal consent now.  All virtual visits are billed to your insurance company just like a normal visit would be.  By agreeing to a virtual visit, we'd like you to understand that the technology does not allow for your provider to perform an examination, and thus may limit your provider's ability to fully assess your condition. If your provider identifies any concerns that need to be evaluated in person, we will make arrangements to do so.  Finally, though the technology is pretty good, we cannot assure that it will always work on either your or our end, and in the setting of a video visit, we may have to convert it to a phone-only visit.  In either situation, we cannot ensure that we have a secure connection.  Are you willing to proceed?" STAFF: Did the patient verbally acknowledge consent to telehealth visit?  Document YES/NO here: YES  4. Advise patient to be prepared - "Two hours prior to your appointment, go ahead and check your blood pressure, pulse, oxygen saturation, and your weight (if you have the equipment to check those) and write them all down. When your visit starts, your provider will ask you for this information. If you have an Apple Watch or Kardia device, please plan to have heart rate information ready on the day of your appointment. Please have a pen and paper handy nearby the day of the visit as well."  5. Give patient instructions for MyChart download to smartphone OR Doximity/Doxy.me as below if video visit (depending on what platform provider is using)  6. Inform patient they will receive a phone call 15 minutes prior to their appointment time (may be from unknown caller ID) so they should be prepared to answer    TELEPHONE CALL NOTE  Hannah Kirby has been deemed a candidate for a follow-up tele-health visit to limit community exposure during the Covid-19 pandemic. I spoke with the patient via phone to ensure availability of phone/video source, confirm preferred email & phone number, and discuss instructions and expectations.  I reminded Hannah Kirby to be prepared with any vital sign and/or heart rhythm information that could potentially be obtained via home monitoring, at the time of her visit. I reminded Hannah Kirby to expect a phone call  prior to her visit.  Hannah Kirby 06/20/2018 1:34 PM   INSTRUCTIONS FOR DOWNLOADING THE MYCHART APP TO SMARTPHONE  - The patient must first make sure to have activated MyChart and know their login information - If Apple, go to [employer] and type in MyChart in the search bar and download the app. If Android, ask patient to go to [employer] and type in [place] in the search bar and download the app. The app is free but as with any other app downloads, their phone may require them to verify saved payment  information or Apple/Android password.  - The patient will need to then log into the app with their MyChart username and password, and select [facility] as their healthcare provider to link the account. When it is time for your visit, go to the MyChart app, find appointments, and click Begin Video Visit. Be sure to Select Allow for your device to access the Microphone and Camera for your visit. You will then be connected, and your provider will be with you shortly.  **If they have any issues connecting, or need assistance please contact MyChart service desk [phone])**  **If using a computer, in order to ensure the best quality for their visit they will need to use either of the following Internet Browsers: [employer], or Google Chrome**  IF USING DOXIMITY or DOXY.ME - The patient will receive a link just prior to their visit by text.     FULL LENGTH CONSENT FOR TELE-HEALTH VISIT   I hereby voluntarily request, consent and authorize [facility] and its employed or contracted physicians, physician assistants, nurse practitioners or other licensed health care professionals (the Practitioner), to provide me with telemedicine health care services (the "Services") as deemed necessary by the treating Practitioner. I acknowledge and consent to receive the Services by the Practitioner via telemedicine. I understand that the telemedicine visit will involve communicating with the Practitioner through live audiovisual communication technology and the disclosure of certain medical information by electronic transmission. I acknowledge that I have been given the opportunity to request an in-person assessment or other available alternative prior to the telemedicine visit and am voluntarily participating in the telemedicine visit.  I understand that I have the right to withhold or withdraw my consent to the use of telemedicine in the course of my care at any time, without affecting my right  to future care or treatment, and that the Practitioner or I may terminate the telemedicine visit at any time. I understand that I have the right to inspect all information obtained and/or recorded in the course of the telemedicine visit and may receive copies of available information for a reasonable fee.  I understand that some of the potential risks of receiving the Services via telemedicine include:  Mar[facility] Kitchen Delay or interruption in medical evaluation due to technological equipment failure or disruption; . Information transmitted may not be sufficient (e.g. poor resolution of images) to allow for appropriate medical decision making by the Practitioner; and/or  . In rare instances, security protocols could fail, causing a breach of personal health information.  Furthermore, I acknowledge that it is my responsibility to provide information about my medical history, conditions and care that is complete and accurate to the best of my ability. I acknowledge that Practitioner's advice, recommendations, and/or decision may be based on factors not within their control, such as incomplete or inaccurate data provided by me or distortions of diagnostic images or specimens that may result from  electronic transmissions. I understand that the practice of medicine is not an exact science and that Practitioner makes no warranties or guarantees regarding treatment outcomes. I acknowledge that I will receive a copy of this consent concurrently upon execution via email to the email address I last provided but may also request a printed copy by calling the office of [facility].    I understand that my insurance will be billed for this visit.   I have read or had this consent read to me. . I understand the contents of this consent, which adequately explains the benefits and risks of the Services being provided via telemedicine.  . I have been provided ample opportunity to ask questions regarding this consent and the Services  and have had my questions answered to my satisfaction. . I give my informed consent for the services to be provided through the use of telemedicine in my medical care  By participating in this telemedicine visit I agree to the above.

## 2018-06-28 ENCOUNTER — Ambulatory Visit (INDEPENDENT_AMBULATORY_CARE_PROVIDER_SITE_OTHER): Payer: Medicare Other

## 2018-06-28 ENCOUNTER — Other Ambulatory Visit: Payer: Self-pay

## 2018-06-28 DIAGNOSIS — R0600 Dyspnea, unspecified: Secondary | ICD-10-CM | POA: Diagnosis not present

## 2018-07-01 NOTE — Progress Notes (Signed)
Virtual Visit via Video Note   This visit type was conducted due to national recommendations for restrictions regarding the COVID-19 Pandemic (e.g. social distancing) in an effort to limit this patient's exposure and mitigate transmission in our community.  Due to her co-morbid illnesses, this patient is at least at moderate risk for complications without adequate follow up.  This format is felt to be most appropriate for this patient at this time.  All issues noted in this document were discussed and addressed.  A limited physical exam was performed with this format.  Please refer to the patient's chart for her consent to telehealth for [facility].   I connected with  Hannah Kirby on 07/03/18 by a video enabled telemedicine application and verified that I am speaking with the correct person using two identifiers. I discussed the limitations of evaluation and management by telemedicine. The patient expressed understanding and agreed to proceed.   Evaluation Performed:  Follow-up visit  Date:  07/03/2018   ID:  Hannah Kirby, DOB [birth year], MRN [id number]  Patient Location:  [address] [address]   Provider location:   [facility], [place] office  PCP:  Birdie Sons, MD  Cardiologist:  Arvid Right [facility]   Chief Complaint: Elevated blood pressure, medication side effects   History of Present Illness:    Hannah Kirby is a 73 y.o. female who presents via audio/video conferencing for a telehealth visit today.   The patient does not symptoms concerning for COVID-19 infection (fever, chills, cough, or new SHORTNESS OF BREATH).   Patient has a past medical history of diabetes,  obesity,  Anxiety, panic attacks significantly improved with  weight loss on Victoza,  coronary artery disease with stenting of her RCA in January 2008,  long smoking history who continues to smoke,  hyperlipidemia  outbreak of her granulomatous disease in  summary 2013, prednisone for this condition.  History pinched nerve,  on pain medication. Chronic constipation Chronic shortness of breath, COPD who presents for routine followup of her coronary artery disease and hypertension.  Recent studies reviewed with her Stress test 11/2017  The study is normal.  This is a low risk study.  The left ventricular ejection fraction is normal (55-65%).  on spironolactone earlier this month given poorly controlled blood pressure.  Since starting spironolactone, the patient noted increase in palpitations and associated shortness of breath.  In this setting, the patient would have panic attacks leading to significant spikes in her blood pressure.   Cut cardura in 1/2 daily, she reports that taking a whole 1 mg daily made her feel dizzy HCTZ held 04/2016 out of concern for kidney, CR 1.5 Previously on norvasc, held as she was concerned this would make her dermatologic issues worse, turned out it did not have any effect on her dermatologic issue  Today Blood pressure 169/99, shortly after am meds Other days  150 and higher Feels that she needs additional medications for blood pressure  Denies any significant chest pain or shortness of breath Weight trending upwards, no regular exercise program Long history of smoking Chronic shortness of breath, deconditioning  Other past medical history reviewed In the ER 09/2017 ABD pain, nausea constipated Constipation is a chronic issue and she is only taking Colace  Chronically elevated WBC   previous strokes on MRI  hospital with stroke 10/30/2016 Left upper extremity weakness on coming back from the beach while in the car She was on  aspirin and Plavix at the time Echocardiogram normal ejection fractionno significant valve disease, moderate LVH, diastolic dysfunction  MRI brain: Small acute right basal ganglia region infarct. Prior strokes MRA: negative. Carotid duplex showed bilateral 0-49%  stenosis.    Prior CV studies:   The following studies were reviewed today:    Past Medical History:  Diagnosis Date  . Anemia   . Arthritis   . CAD (coronary artery disease)    a. cath 02/2006: BMS x 2 to RCA, cath o/w without significant coronary disease; b. nuclear stress test 07/2014: no signs of ischemia, no ekg changes concerning for ischemia, low risk study/normal study  . Chronic bronchitis ([place])    secondary to cigarette smoking  . Chronic kidney disease (CKD), stage III (moderate) (HCC)   . COPD (chronic obstructive pulmonary disease) ([place])   . Diabetes mellitus   . FHx: allergies   . GERD (gastroesophageal reflux disease)   . Goiter   . Granulomatous disease ([place])   . Hernia   . Hyperlipidemia   . Hypertension   . Kidney stone on left side 2013  . Microalbuminuria   . Obesity   . Smokers' cough ([place])   . Spinal stenosis   . Stroke ([place]) 10/29/2016   mild left side weakness   Past Surgical History:  Procedure Laterality Date  . ABDOMINAL HYSTERECTOMY    . BREAST SURGERY    . CATARACT EXTRACTION W/PHACO Right 03/01/2017   Procedure: CATARACT EXTRACTION PHACO AND INTRAOCULAR LENS PLACEMENT ([place]) RIGHT DIABETIC;  Surgeon: Leandrew Koyanagi, MD;  Location: [place];  Service: Ophthalmology;  Laterality: Right;  . CATARACT EXTRACTION W/PHACO Left 03/22/2017   Procedure: CATARACT EXTRACTION PHACO AND INTRAOCULAR LENS PLACEMENT ([place]) LEFT DIABETIC;  Surgeon: Leandrew Koyanagi, MD;  Location: [place];  Service: Ophthalmology;  Laterality: Left;  Diabetic - insulin and oral meds  . COLONOSCOPY WITH PROPOFOL N/A 09/23/2014   Procedure: COLONOSCOPY WITH PROPOFOL;  Surgeon: Lollie Sails, MD;  Location: [facility] ENDOSCOPY;  Service: Endoscopy;  Laterality: N/A;  . COLONOSCOPY WITH PROPOFOL N/A 01/11/2018   Procedure: COLONOSCOPY WITH PROPOFOL;  Surgeon: Lollie Sails, MD;  Location: [facility] ENDOSCOPY;  Service: Endoscopy;  Laterality: N/A;  .  COLONOSCOPY WITH PROPOFOL N/A 04/24/2018   Procedure: COLONOSCOPY WITH PROPOFOL;  Surgeon: Lollie Sails, MD;  Location: [facility] ENDOSCOPY;  Service: Endoscopy;  Laterality: N/A;  . CORONARY ANGIOPLASTY WITH STENT PLACEMENT  2008  . ESOPHAGOGASTRODUODENOSCOPY (EGD) WITH PROPOFOL N/A 12/30/2014   Procedure: ESOPHAGOGASTRODUODENOSCOPY (EGD) WITH PROPOFOL;  Surgeon: Lollie Sails, MD;  Location: [facility] ENDOSCOPY;  Service: Endoscopy;  Laterality: N/A;  . ESOPHAGOGASTRODUODENOSCOPY (EGD) WITH PROPOFOL N/A 07/19/2016   Procedure: ESOPHAGOGASTRODUODENOSCOPY (EGD) WITH PROPOFOL;  Surgeon: Lollie Sails, MD;  Location[facility] ENDOSCOPY;  Service: Endoscopy;  Laterality: N/A;  . ESOPHAGOGASTRODUODENOSCOPY (EGD) WITH PROPOFOL N/A 04/24/2018   Procedure: ESOPHAGOGASTRODUODENOSCOPY (EGD) WITH PROPOFOL;  Surgeon: Lollie Sails, MD;  Location: [facility] ENDOSCOPY;  Service: Endoscopy;  Laterality: N/A;  . hysterectomy (other)       Current Meds  Medication Sig  . acetaminophen (TYLENOL) 500 MG tablet Take 500 mg by mouth every 6 (six) hours as needed.  Marland Kitchen albuterol (VENTOLIN HFA) 108 (90 Base) MCG/ACT inhaler Inhale 2 puffs into the lungs every 4 (four) hours as needed for wheezing or shortness of breath.  Marland Kitchen aspirin (ASPIR-81) 81 MG EC tablet Take 81 mg by mouth daily.    . clopidogrel (PLAVIX) 75 MG tablet Take 1 tablet (75 mg total)  by mouth daily.  . cyanocobalamin 1000 MCG tablet Take 1,000 mcg by mouth daily.  Marland Kitchen doxazosin (CARDURA) 1 MG tablet Take 1 tablet (1 mg total) by mouth daily. (Patient taking differently: Take 0.5 mg by mouth daily. )  . ezetimibe (ZETIA) 10 MG tablet Take 1 tablet (10 mg total) by mouth daily.  . fluticasone furoate-vilanterol (BREO ELLIPTA) 100-25 MCG/INH AEPB Inhale 1 puff into the lungs daily.  Marland Kitchen HYDROcodone-acetaminophen (NORCO/VICODIN) 5-325 MG tablet Take 1 tablet by mouth every 6 (six) hours as needed.  . insulin glargine (LANTUS) 100 UNIT/ML injection Inject 12 Units  into the skin daily.   . Liraglutide (VICTOZA) 18 MG/3ML SOLN injection Inject 1.8 mg into the skin daily.   Marland Kitchen lisinopril (PRINIVIL,ZESTRIL) 20 MG tablet Take 1 tablet (20 mg total) by mouth daily.  . meclizine (ANTIVERT) 25 MG tablet Take by mouth as needed.   . metoprolol tartrate (LOPRESSOR) 100 MG tablet Take 1 tablet (100 mg total) by mouth 2 (two) times daily.  . Multiple Vitamins-Minerals (DAILY MULTI) TABS Take 1 tablet by mouth daily.   . nitroGLYCERIN (NITROSTAT) 0.4 MG SL tablet Place 1 tablet (0.4 mg total) under the tongue every 5 (five) minutes as needed.  . Omega-3 Fatty Acids (FISH OIL) 1000 MG CAPS Take 2 capsules by mouth 2 (two) times daily. Take 4  . omeprazole (PRILOSEC) 40 MG capsule Take 40 mg by mouth daily as needed.  . ondansetron (ZOFRAN ODT) 4 MG disintegrating tablet Take 1 tablet (4 mg total) by mouth every 8 (eight) hours as needed for nausea or vomiting.  . RABEprazole (ACIPHEX) 20 MG tablet Take 20 mg by mouth daily.  . rosuvastatin (CRESTOR) 5 MG tablet Take 1 tablet (5 mg total) by mouth daily.  . sucralfate (CARAFATE) 1 g tablet Take 1 g by mouth 2 (two) times daily.  Marland Kitchen tiotropium (SPIRIVA) 18 MCG inhalation capsule Place 1 capsule (18 mcg total) into inhaler and inhale daily.     Allergies:   Sulfonamide derivatives; Amlodipine; Statins; Sulfa antibiotics; Codeine; and Prednisone   Social History   Tobacco Use  . Smoking status: Current Every Day Smoker    Packs/day: 0.75    Years: 40.00    Pack years: 30.00    Types: Cigarettes  . Smokeless tobacco: Never Used  Substance Use Topics  . Alcohol use: Yes    Comment: 1/month- wine  . Drug use: No     Current Outpatient Medications on File Prior to Visit  Medication Sig Dispense Refill  . acetaminophen (TYLENOL) 500 MG tablet Take 500 mg by mouth every 6 (six) hours as needed.    Marland Kitchen albuterol (VENTOLIN HFA) 108 (90 Base) MCG/ACT inhaler Inhale 2 puffs into the lungs every 4 (four) hours as needed  for wheezing or shortness of breath. 48 g 3  . aspirin (ASPIR-81) 81 MG EC tablet Take 81 mg by mouth daily.      . clopidogrel (PLAVIX) 75 MG tablet Take 1 tablet (75 mg total) by mouth daily. 90 tablet 3  . cyanocobalamin 1000 MCG tablet Take 1,000 mcg by mouth daily.    Marland Kitchen doxazosin (CARDURA) 1 MG tablet Take 1 tablet (1 mg total) by mouth daily. (Patient taking differently: Take 0.5 mg by mouth daily. ) 30 tablet 11  . ezetimibe (ZETIA) 10 MG tablet Take 1 tablet (10 mg total) by mouth daily. 90 tablet 3  . fluticasone furoate-vilanterol (BREO ELLIPTA) 100-25 MCG/INH AEPB Inhale 1 puff into the lungs daily.  84 each 3  . HYDROcodone-acetaminophen (NORCO/VICODIN) 5-325 MG tablet Take 1 tablet by mouth every 6 (six) hours as needed. 120 tablet 0  . insulin glargine (LANTUS) 100 UNIT/ML injection Inject 12 Units into the skin daily.     . Liraglutide (VICTOZA) 18 MG/3ML SOLN injection Inject 1.8 mg into the skin daily.     Marland Kitchen lisinopril (PRINIVIL,ZESTRIL) 20 MG tablet Take 1 tablet (20 mg total) by mouth daily. 90 tablet 3  . meclizine (ANTIVERT) 25 MG tablet Take by mouth as needed.     . metoprolol tartrate (LOPRESSOR) 100 MG tablet Take 1 tablet (100 mg total) by mouth 2 (two) times daily. 180 tablet 3  . Multiple Vitamins-Minerals (DAILY MULTI) TABS Take 1 tablet by mouth daily.     . nitroGLYCERIN (NITROSTAT) 0.4 MG SL tablet Place 1 tablet (0.4 mg total) under the tongue every 5 (five) minutes as needed. 25 tablet 6  . Omega-3 Fatty Acids (FISH OIL) 1000 MG CAPS Take 2 capsules by mouth 2 (two) times daily. Take 4    . omeprazole (PRILOSEC) 40 MG capsule Take 40 mg by mouth daily as needed.    . ondansetron (ZOFRAN ODT) 4 MG disintegrating tablet Take 1 tablet (4 mg total) by mouth every 8 (eight) hours as needed for nausea or vomiting. 20 tablet 0  . RABEprazole (ACIPHEX) 20 MG tablet Take 20 mg by mouth daily.    . rosuvastatin (CRESTOR) 5 MG tablet Take 1 tablet (5 mg total) by mouth daily.  90 tablet 3  . sucralfate (CARAFATE) 1 g tablet Take 1 g by mouth 2 (two) times daily.    Marland Kitchen tiotropium (SPIRIVA) 18 MCG inhalation capsule Place 1 capsule (18 mcg total) into inhaler and inhale daily. 90 capsule 4   No current facility-administered medications on file prior to visit.      Family Hx: The patient's family history includes Breast cancer in her maternal aunt; Congestive Heart Failure in her mother; Diabetes in her mother; Heart failure in her mother; Lung cancer in her father; Stroke in her mother.  ROS:   Please see the history of present illness.    Review of Systems  Constitutional: Negative.   Respiratory: Positive for shortness of breath.   Cardiovascular: Negative.   Gastrointestinal: Negative.   Musculoskeletal: Negative.   Skin: Positive for rash.  Neurological: Negative.   Psychiatric/Behavioral: Negative.   All other systems reviewed and are negative.     Labs/Other Tests and Data Reviewed:    Recent Labs: 10/17/2017: ALT 25; Hemoglobin 17.2; Platelets 223 05/09/2018: BUN 24; Creatinine, Ser 1.51; Potassium 3.4; Sodium 142   Recent Lipid Panel Lab Results  Component Value Date/Time   CHOL 129 10/30/2016 05:15 AM   CHOL 79 (L) 05/12/2016 07:48 AM   TRIG 118 10/30/2016 05:15 AM   HDL 52 10/30/2016 05:15 AM   HDL 28 (L) 05/12/2016 07:48 AM   CHOLHDL 2.5 10/30/2016 05:15 AM   LDLCALC 53 10/30/2016 05:15 AM   LDLCALC 17 05/12/2016 07:48 AM    Wt Readings from Last 3 Encounters:  07/03/18 166 lb (75.3 kg)  04/24/18 168 lb (76.2 kg)  04/20/18 168 lb (76.2 kg)     Exam:    Vital Signs: Vital signs may also be detailed in the HPI BP (!) 169/99   Pulse 73   Ht 5\' 5"  (1.651 m)   Wt 166 lb (75.3 kg)   BMI 27.62 kg/m   Wt Readings from Last 3 Encounters:  07/03/18 166 lb (75.3 kg)  04/24/18 168 lb (76.2 kg)  04/20/18 168 lb (76.2 kg)   Temp Readings from Last 3 Encounters:  04/24/18 (!) 97.2 F (36.2 C) (Tympanic)  03/26/18 97.9 F (36.6 C)  (Oral)  01/11/18 (!) 96.8 F (36 C) (Tympanic)   BP Readings from Last 3 Encounters:  07/03/18 (!) 169/99  04/24/18 (!) 180/90  04/20/18 118/80   Pulse Readings from Last 3 Encounters:  07/03/18 73  04/24/18 (!) 59  04/20/18 80     Well nourished, well developed female in no acute distress. Constitutional:  oriented to person, place, and time. No distress.  Head: Normocephalic and atraumatic.  Eyes:  no discharge. No scleral icterus.  Neck: Normal range of motion. Neck supple.  Pulmonary/Chest: No audible wheezing, no distress, appears comfortable Musculoskeletal: Normal range of motion.  no  tenderness or deformity.  Neurological:   Coordination normal. Full exam not performed Skin:  No rash Psychiatric:  normal mood and affect. behavior is normal. Thought content normal.    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 (HCC) Recent stress test discussed with her, no significant ischemia Ejection fraction on recent echocardiogram 50 to 55%, no further action indicated at this time Unclear if this is from chronic longstanding hypertension, will work on blood pressure control Denies any anginal symptoms  Dyspnea, unspecified type Long time smoker, chronic shortness of breath with underlying COPD  Essential hypertension Recommend she restart amlodipine 10 mg daily Previously was tolerating this well Recommend she monitor blood pressure over the next several weeks and call our office if blood pressure continues to run high  Renal insufficiency Stable renal function  Hyperlipidemia LDL goal <70 Cholesterol is at goal on the current lipid regimen. No changes to the medications were made.  Chronic obstructive pulmonary disease, unspecified COPD type ([place]) Recommend smoking cessation Management per primary care  Smoker We have encouraged her to continue to work on weaning her cigarettes and smoking cessation.  She will continue to work on this and does not want any assistance with chantix.    COVID-19 Education: The signs and symptoms of COVID-19 were discussed with the patient and how to seek care for testing (follow up with PCP or arrange E-visit).  The importance of social distancing was discussed today.  Patient Risk:   After full review of this patients clinical status, I feel that they are at least moderate risk at this time.  Time:   Today, I have spent 25 minutes with the patient with telehealth technology discussing the cardiac and medical problems/diagnoses detailed above   10 min spent reviewing the chart prior to patient visit today   Medication Adjustments/Labs and Tests Ordered: Current medicines are reviewed at length with the patient today.  Concerns regarding medicines are outlined above.   Tests Ordered: No tests ordered   Medication Changes: No changes made   Disposition: Follow-up in 6 months   Signed, Ida Rogue, MD  07/03/2018 1:47 PM    [facility] [address] [place] [address], [place], [place] [address]

## 2018-07-02 ENCOUNTER — Telehealth: Payer: Self-pay

## 2018-07-02 NOTE — Telephone Encounter (Signed)
-----   Message from Rise Mu, PA-C sent at 06/29/2018  7:35 PM EDT ----- Echo showed pump function 50-55%, slightly stiffened heart, thickened aortic valve, and mildly leaky mitral valve. I will route to Dr. [name] and ask that he compare this echo to her echo in 10/2016 to evaluate both EFs. Her mitral valve is just slightly more leaky and the aortic valve is stable.  Dr. [name],  Will you please review her echo from 10/2016 and this one to compare EFs?

## 2018-07-02 NOTE — Telephone Encounter (Signed)
Results from Echo reviewed with patient.   Results were sent to Dr. [name] for further review. I made pt aware that we will call her back when we hear from Dr. [name]. She reported that she has a e visit with him tomorrow.   No new orders at this time.   Advised pt to call for any further questions or concerns.

## 2018-07-03 ENCOUNTER — Telehealth (INDEPENDENT_AMBULATORY_CARE_PROVIDER_SITE_OTHER): Payer: Medicare Other | Admitting: Cardiovascular Disease

## 2018-07-03 ENCOUNTER — Other Ambulatory Visit: Payer: Self-pay

## 2018-07-03 VITALS — BP 169/99 | HR 73 | Ht 65.0 in | Wt 166.0 lb

## 2018-07-03 DIAGNOSIS — N289 Disorder of kidney and ureter, unspecified: Secondary | ICD-10-CM

## 2018-07-03 DIAGNOSIS — R0600 Dyspnea, unspecified: Secondary | ICD-10-CM

## 2018-07-03 DIAGNOSIS — J449 Chronic obstructive pulmonary disease, unspecified: Secondary | ICD-10-CM | POA: Diagnosis not present

## 2018-07-03 DIAGNOSIS — I25118 Atherosclerotic heart disease of native coronary artery with other forms of angina pectoris: Secondary | ICD-10-CM | POA: Diagnosis not present

## 2018-07-03 DIAGNOSIS — I10 Essential (primary) hypertension: Secondary | ICD-10-CM

## 2018-07-03 DIAGNOSIS — F17200 Nicotine dependence, unspecified, uncomplicated: Secondary | ICD-10-CM

## 2018-07-03 DIAGNOSIS — E785 Hyperlipidemia, unspecified: Secondary | ICD-10-CM

## 2018-07-03 MED ORDER — AMLODIPINE BESYLATE 10 MG PO TABS: 10.0000 mg | ORAL_TABLET | Freq: Every day | ORAL | 3 refills | Status: DC

## 2018-07-03 NOTE — Patient Instructions (Addendum)
Medication Instructions:  Your physician has recommended you make the following change in your medication:  1. START Amlodipine 10 mg once daily  Your physician has requested that you regularly monitor and record your blood pressure readings at home. Please use the same machine at the same time of day to check your readings and record them to bring to your follow-up visit.  She will call with BP measurements. Please see below for additional information on monitoring your blood pressures.   If you need a refill on your cardiac medications before your next appointment, please call your pharmacy.    Lab work: No new labs needed   If you have labs (blood work) drawn today and your tests are completely normal, you will receive your results only by: Marland Kitchen MyChart Message (if you have MyChart) OR . A paper copy in the mail If you have any lab test that is abnormal or we need to change your treatment, we will call you to review the results.   Testing/Procedures: No new testing needed   Follow-Up: At [facility], you and your health needs are our priority.  As part of our continuing mission to provide you with exceptional heart care, we have created designated Provider Care Teams.  These Care Teams include your primary Cardiologist (physician) and Advanced Practice Providers (APPs -  Physician Assistants and Nurse Practitioners) who all work together to provide you with the care you need, when you need it.  . You will need a follow up appointment in 6 months .   Please call our office 2 months in advance to schedule this appointment.    . Providers on your designated Care Team:   . Murray Hodgkins, NP . Christell Faith, PA-C . Marrianne Mood, PA-C  Any Other Special Instructions Will Be Listed Below (If Applicable).  For educational health videos Log in to : [email or web address] Or : [email or web address], password : [email or web address]   How to Take Your Blood Pressure You can take your blood pressure at home  with a machine. You may need to check your blood pressure at home:  To check if you have high blood pressure (hypertension).  To check your blood pressure over time.  To make sure your blood pressure medicine is working. Supplies needed: You will need a blood pressure machine, or monitor. You can buy one at a drugstore or online. When choosing one:  Choose one with an arm cuff.  Choose one that wraps around your upper arm. Only one finger should fit between your arm and the cuff.  Do not choose one that measures your blood pressure from your wrist or finger. Your doctor can suggest a monitor. How to prepare Avoid these things for 30 minutes before checking your blood pressure:  Drinking caffeine.  Drinking alcohol.  Eating.  Smoking.  Exercising. Five minutes before checking your blood pressure:  Pee.  Sit in a dining chair. Avoid sitting in a soft couch or armchair.  Be quiet. Do not talk. How to take your blood pressure Follow the instructions that came with your machine. If you have a digital blood pressure monitor, these may be the instructions: 1. Sit up straight. 2. Place your feet on the floor. Do not cross your ankles or legs. 3. Rest your left arm at the level of your heart. You may rest it on a table, desk, or chair. 4. Pull up your shirt sleeve. 5. Wrap the blood pressure cuff around the upper part of your left arm. The  cuff should be 1 inch (2.5 cm) above your elbow. It is best to wrap the cuff around bare skin. 6. Fit the cuff snugly around your arm. You should be able to place only one finger between the cuff and your arm. 7. Put the cord inside the groove of your elbow. 8. Press the power button. 9. Sit quietly while the cuff fills with air and loses air. 10. Write down the numbers on the screen. 11. Wait 2-3 minutes and then repeat steps 1-10. What do the numbers mean? Two numbers make up your blood pressure. The first number is called systolic  pressure. The second is called diastolic pressure. An example of a blood pressure reading is "120 over 80" (or 120/80). If you are an adult and do not have a medical condition, use this guide to find out if your blood pressure is normal: Normal  First number: below 120.  Second number: below 80. Elevated  First number: 120-129.  Second number: below 80. Hypertension stage 1  First number: 130-139.  Second number: 80-89. Hypertension stage 2  First number: 140 or above.  Second number: 32 or above. Your blood pressure is above normal even if only the top or bottom number is above normal. Follow these instructions at home:  Check your blood pressure as often as your doctor tells you to.  Take your monitor to your next doctor's appointment. Your doctor will: ? Make sure you are using it correctly. ? Make sure it is working right.  Make sure you understand what your blood pressure numbers should be.  Tell your doctor if your medicines are causing side effects. Contact a doctor if:  Your blood pressure keeps being high. Get help right away if:  Your first blood pressure number is higher than 180.  Your second blood pressure number is higher than 120. This information is not intended to replace advice given to you by your health care provider. Make sure you discuss any questions you have with your health care provider. Document Released: 01/21/2008 Document Revised: 01/06/2016 Document Reviewed: 07/17/2015 Elsevier Interactive Patient Education  2019 [employer] Inc.  Blood Pressure Record Sheet To take your blood pressure, you will need a blood pressure machine. You can buy a blood pressure machine (blood pressure monitor) at your clinic, drug store, or online. When choosing one, consider:  An automatic monitor that has an arm cuff.  A cuff that wraps snugly around your upper arm. You should be able to fit only one finger between your arm and the cuff.  A device that  stores blood pressure reading results.  Do not choose a monitor that measures your blood pressure from your wrist or finger. Follow your health care provider's instructions for how to take your blood pressure. To use this form:  Get one reading in the morning (a.m.) before you take any medicines.  Get one reading in the evening (p.m.) before supper.  Take at least 2 readings with each blood pressure check. This makes sure the results are correct. Wait 1-2 minutes between measurements.  Write down the results in the spaces on this form.  Repeat this once a week, or as told by your health care provider.  Make a follow-up appointment with your health care provider to discuss the results. Blood pressure log Date: _______________________  a.m. _____________________(1st reading) _____________________(2nd reading)  p.m. _____________________(1st reading) _____________________(2nd reading) Date: _______________________  a.m. _____________________(1st reading) _____________________(2nd reading)  p.m. _____________________(1st reading) _____________________(2nd reading) Date: _______________________  a.m. _____________________(1st  reading) _____________________(2nd reading)  p.m. _____________________(1st reading) _____________________(2nd reading) Date: _______________________  a.m. _____________________(1st reading) _____________________(2nd reading)  p.m. _____________________(1st reading) _____________________(2nd reading) Date: _______________________  a.m. _____________________(1st reading) _____________________(2nd reading)  p.m. _____________________(1st reading) _____________________(2nd reading) This information is not intended to replace advice given to you by your health care provider. Make sure you discuss any questions you have with your health care provider. Document Released: 11/06/2002 Document Revised: 02/07/2017 Document Reviewed: 02/07/2017 Elsevier Interactive  Patient Education  2019 [employer].

## 2018-07-17 ENCOUNTER — Other Ambulatory Visit: Payer: Self-pay | Admitting: Family Medicine

## 2018-07-17 DIAGNOSIS — M48061 Spinal stenosis, lumbar region without neurogenic claudication: Secondary | ICD-10-CM

## 2018-07-17 MED ORDER — HYDROCODONE-ACETAMINOPHEN 5-325 MG PO TABS: 1.0000 | ORAL_TABLET | Freq: Four times a day (QID) | ORAL | 0 refills | Status: DC | PRN

## 2018-07-27 DIAGNOSIS — I10 Essential (primary) hypertension: Secondary | ICD-10-CM | POA: Diagnosis not present

## 2018-07-27 DIAGNOSIS — E1122 Type 2 diabetes mellitus with diabetic chronic kidney disease: Secondary | ICD-10-CM | POA: Diagnosis not present

## 2018-07-27 DIAGNOSIS — Z720 Tobacco use: Secondary | ICD-10-CM | POA: Diagnosis not present

## 2018-07-27 DIAGNOSIS — N2581 Secondary hyperparathyroidism of renal origin: Secondary | ICD-10-CM | POA: Diagnosis not present

## 2018-07-27 DIAGNOSIS — N183 Chronic kidney disease, stage 3 (moderate): Secondary | ICD-10-CM | POA: Diagnosis not present

## 2018-07-27 DIAGNOSIS — R809 Proteinuria, unspecified: Secondary | ICD-10-CM | POA: Diagnosis not present

## 2018-07-31 DIAGNOSIS — L821 Other seborrheic keratosis: Secondary | ICD-10-CM | POA: Diagnosis not present

## 2018-07-31 DIAGNOSIS — Z1283 Encounter for screening for malignant neoplasm of skin: Secondary | ICD-10-CM | POA: Diagnosis not present

## 2018-07-31 DIAGNOSIS — L720 Epidermal cyst: Secondary | ICD-10-CM | POA: Diagnosis not present

## 2018-07-31 DIAGNOSIS — L920 Granuloma annulare: Secondary | ICD-10-CM | POA: Diagnosis not present

## 2018-07-31 DIAGNOSIS — L570 Actinic keratosis: Secondary | ICD-10-CM | POA: Diagnosis not present

## 2018-08-01 IMAGING — MR MR HEAD W/O CM
11 series · 42 of 48 positions shown · non-contrast
Comparison: Head CT 10/29/2016

CLINICAL DATA: Left upper extremity weakness.

EXAM:
MRI HEAD WITHOUT CONTRAST
MRA HEAD WITHOUT CONTRAST
TECHNIQUE: Multiplanar, multiecho pulse sequences of the brain and surrounding
structures were obtained without intravenous contrast. Angiographic
images of the head were obtained using MRA technique without
contrast.

[Series 2: T1 · sagittal · 5.0mm · 0.45mm/px · 1 of 27 slices shown (1 of 2)]
[im 1/27]
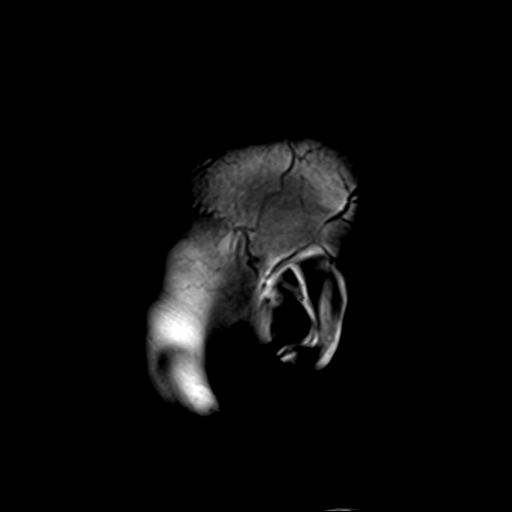

[Series 4: DWI · axial · 3.0mm · 1.80mm/px · z∈[-57,+104]mm · 4 of 53 slices shown (1 of 4)]
[im 1/53]
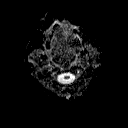
[im 18/53]
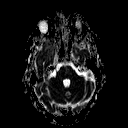
[im 35/53]
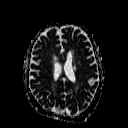
[im 53/53]
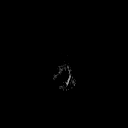

[Series 6: DWI · coronal · 3.0mm · 1.80mm/px · 3 of 45 slices shown (2 of 4)]
[im 1/45]
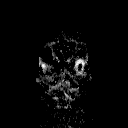
[im 23/45]
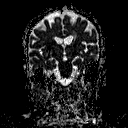
[im 45/45]
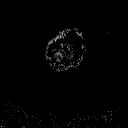

[Series 7: TOF · axial · non-contrast · 0.7mm · 0.37mm/px · z∈[-40,+59]mm · 8 of 143 slices shown]
[im 1/143]
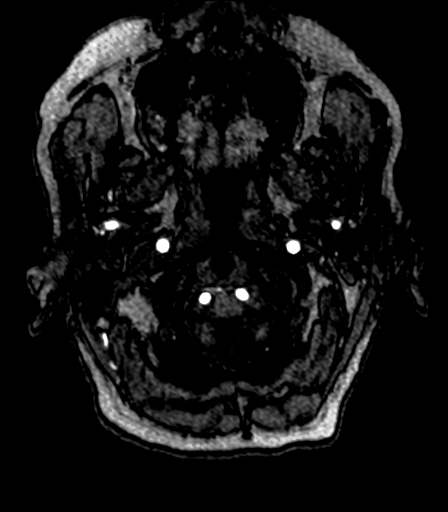
[im 16/143]
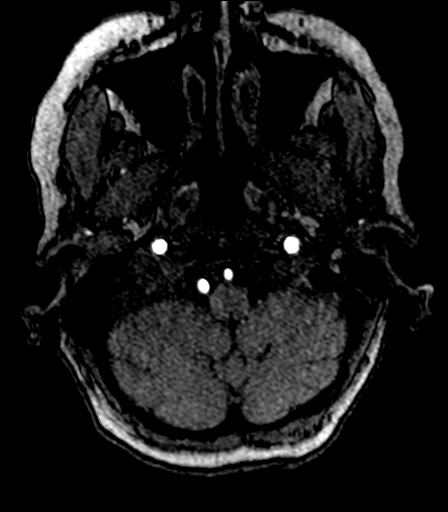
[im 48/143]
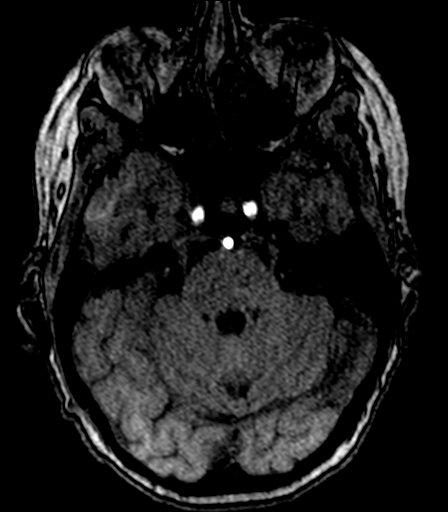
[im 64/143]
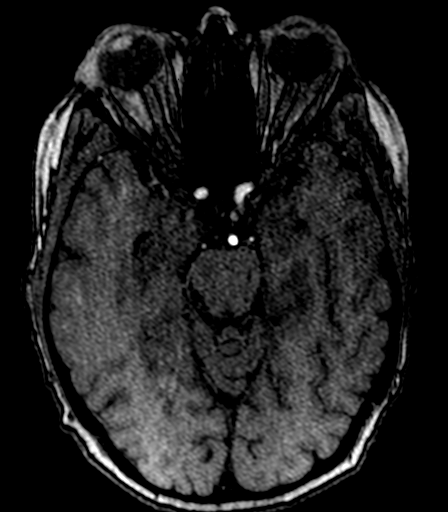
[im 79/143]
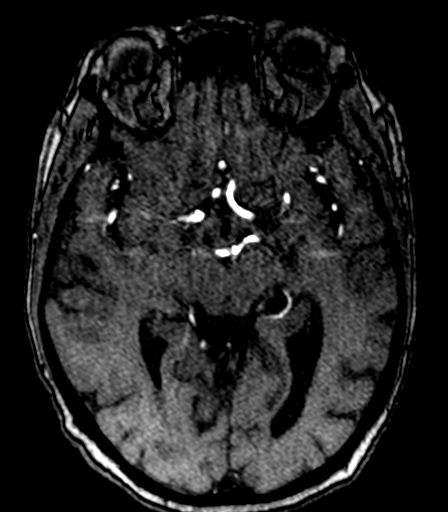
[im 95/143]
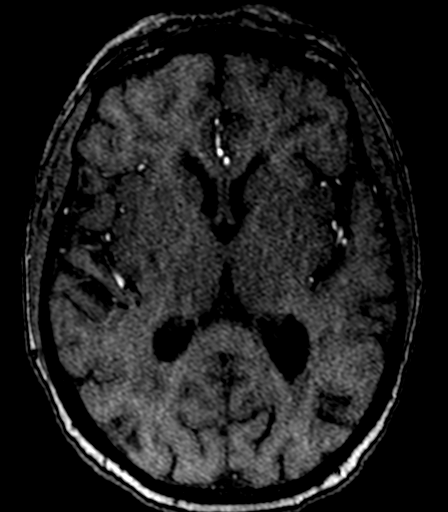
[im 127/143]
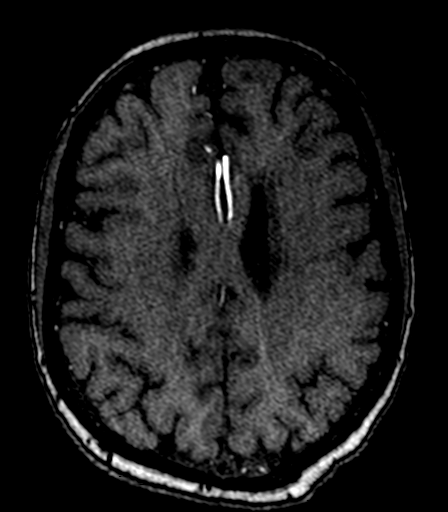
[im 143/143]
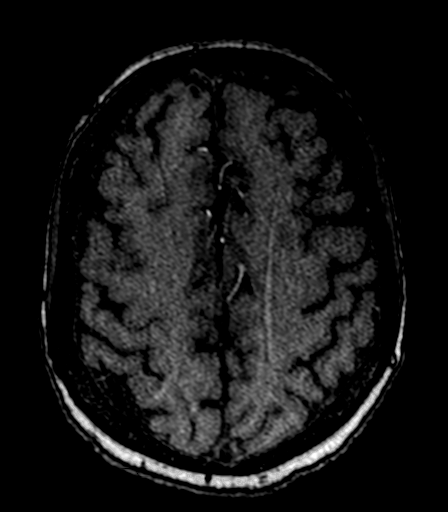

[Series 12: T2 · axial · 5.0mm · 0.60mm/px · z∈[-56,+98]mm · 2 of 25 slices shown (1 of 3)]
[im 1/25]
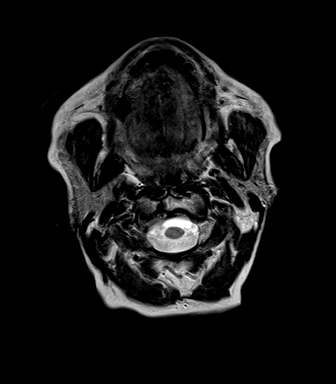
[im 25/25]
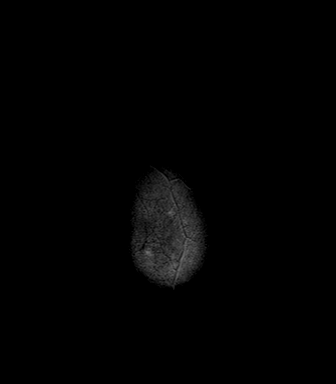

[Series 13: FLAIR · axial · 3.0mm · 0.45mm/px · z∈[-56,+98]mm · 4 of 53 slices shown]
[im 1/53]
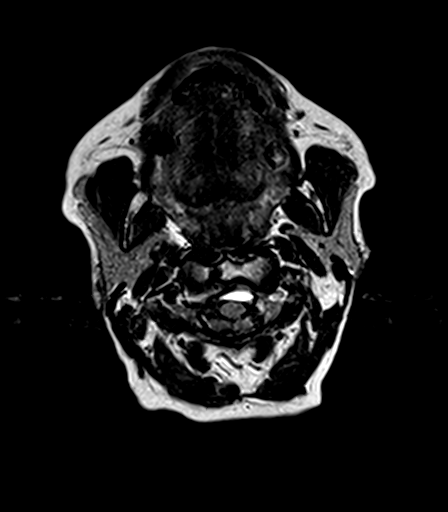
[im 18/53]
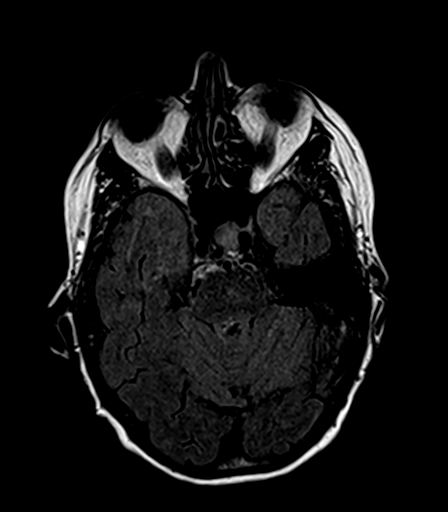
[im 35/53]
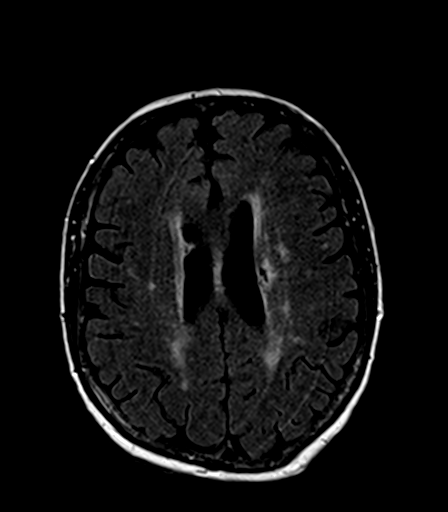
[im 53/53]
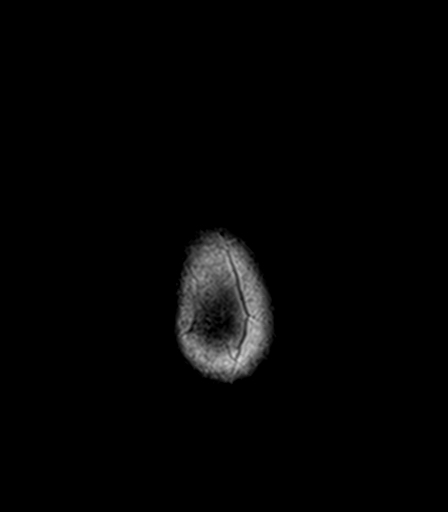

[Series 14: T2 · axial · 5.0mm · 0.45mm/px · z∈[-56,+98]mm · 2 of 25 slices shown (2 of 3)]
[im 1/25]
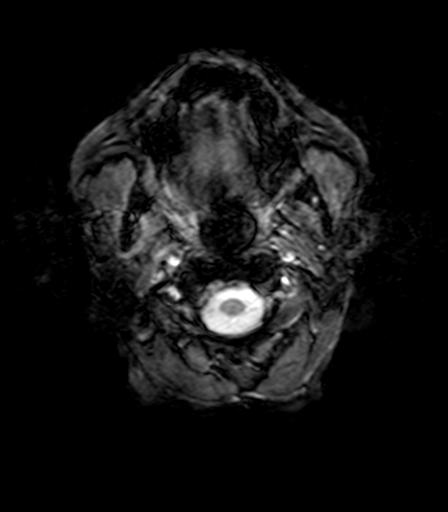
[im 25/25]
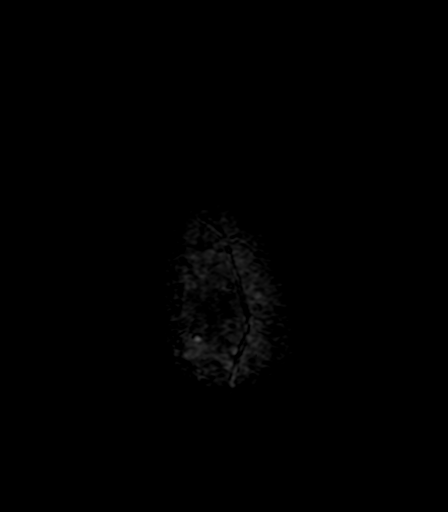

[Series 15: T1 · axial · 1.0mm · 1.00mm/px · z∈[-66,+108]mm · 9 of 176 slices shown (2 of 2)]
[im 1/176]
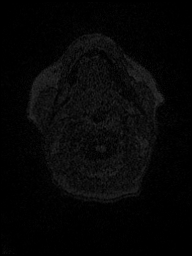
[im 15/176]
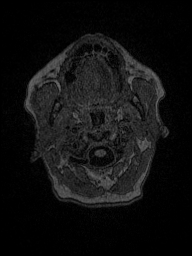
[im 30/176]
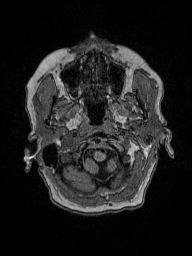
[im 59/176]
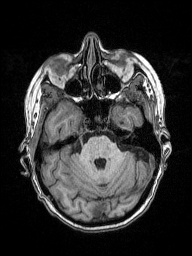
[im 73/176]
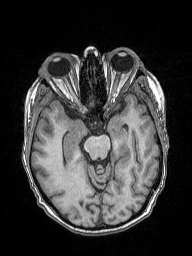
[im 103/176]
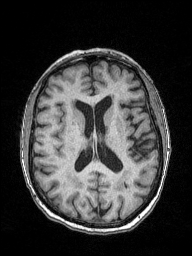
[im 117/176]
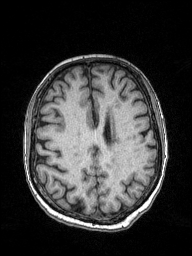
[im 146/176]
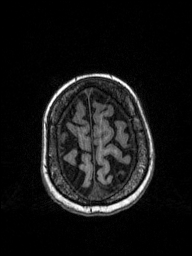
[im 176/176]
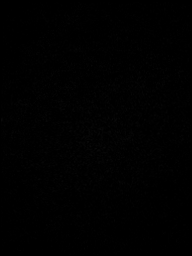

[Series 16: T2 · coronal · 5.0mm · 0.49mm/px · 2 of 27 slices shown (3 of 3)]
[im 1/27]
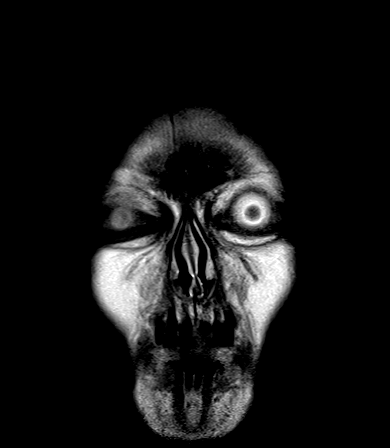
[im 27/27]
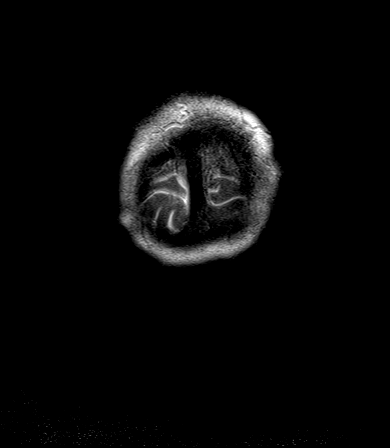

[Series 100: DWI · axial · 3.0mm · 1.80mm/px · z∈[-57,+104]mm · 4 of 54 slices shown (3 of 4)]
[im 1/54]
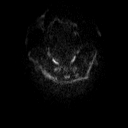
[im 18/54]
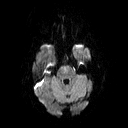
[im 36/54]
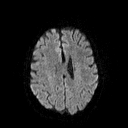
[im 54/54]
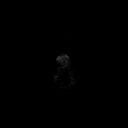

[Series 101: DWI · coronal · 3.0mm · 1.80mm/px · 3 of 45 slices shown (4 of 4)]
[im 1/45]
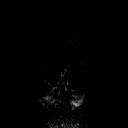
[im 23/45]
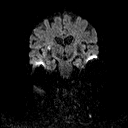
[im 45/45]
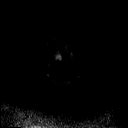

[42 of 48 positions shown; findings below may reference images not displayed]

FINDINGS: MRI HEAD FINDINGS

Brain: There is a small acute right lateral lenticulostriate
territory infarct involving the posterior lentiform nucleus,
external capsule, and corona radiata. Chronic microhemorrhages are
noted at the level of the insula bilaterally as well as in the
posterior left temporal lobe. There are chronic lacunar infarcts
involving the bilateral basal ganglia and right thalamus as well as
left paracentral pons. Patchy subcortical and deep cerebral white
matter T2 hyperintensities are compatible with moderate chronic
small vessel ischemic disease. There is mild generalized cerebral
atrophy. No mass, midline shift, or extra-axial fluid collection is
seen.

Vascular: Major intracranial vascular flow voids are preserved.

Skull and upper cervical spine: Unremarkable bone marrow signal.
Moderate to severe upper cervical facet arthrosis.

Sinuses/Orbits: Unremarkable orbits. Paranasal sinuses and mastoid
air cells are clear.

Other: None.

MRA HEAD FINDINGS

The visualized distal vertebral arteries are widely patent to the
basilar with the right being minimally larger than the left. Patent
PICA and SCA origins are visualized bilaterally. The basilar artery
is widely patent. Posterior communicating arteries are diminutive or
absent. PCAs are patent without evidence of significant stenosis.

The internal carotid arteries are patent from skullbase to carotid
termini without evidence of significant stenosis. ACAs and MCAs are
patent without evidence of proximal branch occlusion or significant
stenosis. No aneurysm is identified.
IMPRESSION: 1. Small acute right basal ganglia region infarct.
2. Moderate chronic small vessel ischemic disease with chronic
lacunar infarcts as above.
3. Negative head MRA.

## 2018-08-15 ENCOUNTER — Telehealth: Payer: Self-pay | Admitting: Family Medicine

## 2018-08-15 NOTE — Chronic Care Management (AMB) (Signed)
Chronic Care Management   Note  08/15/2018 Name: Hannah Kirby MRN: [id number] DOB: [birth year]  Hannah Kirby is a 73 y.o. year old female who is a primary care patient of Caryn Section, Kirstie Peri, MD. I reached out to Hannah Kirby by phone today in response to a referral sent by [name]'s health plan.    Hannah Kirby was given information about Chronic Care Management services today including:  1. CCM service includes personalized support from designated clinical staff supervised by her physician, including individualized plan of care and coordination with other care providers 2. 24/7 contact phone numbers for assistance for urgent and routine care needs. 3. Service will only be billed when office clinical staff spend 20 minutes or more in a month to coordinate care. 4. Only one practitioner may furnish and bill the service in a calendar month. 5. The patient may stop CCM services at any time (effective at the end of the month) by phone call to the office staff. 6. The patient will be responsible for cost sharing (co-pay) of up to 20% of the service fee (after annual deductible is met).  Patient agreed to services and verbal consent obtained.   Follow up plan: Telephone appointment with CCM team member scheduled for: 09/03/2018  [place]  ??[email or web address]   ??[phone]

## 2018-08-27 ENCOUNTER — Other Ambulatory Visit: Payer: Self-pay | Admitting: Family Medicine

## 2018-08-27 DIAGNOSIS — M48061 Spinal stenosis, lumbar region without neurogenic claudication: Secondary | ICD-10-CM

## 2018-08-27 MED ORDER — HYDROCODONE-ACETAMINOPHEN 5-325 MG PO TABS: 1.0000 | ORAL_TABLET | Freq: Four times a day (QID) | ORAL | 0 refills | Status: DC | PRN

## 2018-09-03 ENCOUNTER — Ambulatory Visit: Payer: Self-pay | Admitting: Pharmacist

## 2018-09-03 ENCOUNTER — Telehealth: Payer: Medicare Other

## 2018-09-03 NOTE — Chronic Care Management (AMB) (Signed)
  Chronic Care Management   Note  09/03/2018 Name: Hannah Kirby MRN: [id number] DOB: [birth year]  73 y.o. year old female referred to Chronic Care Management by health plan. Last office visit with Birdie Sons, MD was 03/26/2018. Patient had scheduled appointment with CCM pharmacist (no show).    Was unable to reach patient via telephone today and have left HIPAA compliant voicemail asking patient to return my call. (unsuccessful outreach #1).  Follow up plan: A HIPPA compliant phone message was left for the patient providing contact information and requesting a return call.  The care management team will reach out to the patient again over the next 5-7 days.   Ruben Reason, PharmD Clinical Pharmacist [place] [phone]

## 2018-09-06 ENCOUNTER — Encounter: Payer: Self-pay | Admitting: Family Medicine

## 2018-09-06 DIAGNOSIS — J449 Chronic obstructive pulmonary disease, unspecified: Secondary | ICD-10-CM

## 2018-09-07 MED ORDER — BREO ELLIPTA 100-25 MCG/INH IN AEPB: 1.0000 | INHALATION_SPRAY | Freq: Every day | RESPIRATORY_TRACT | 3 refills | Status: DC

## 2018-09-10 ENCOUNTER — Ambulatory Visit: Payer: Self-pay | Admitting: Pharmacist

## 2018-09-10 ENCOUNTER — Telehealth: Payer: Self-pay

## 2018-09-10 NOTE — Chronic Care Management (AMB) (Signed)
  Chronic Care Management   Note  09/10/2018 Name: Hannah Kirby MRN: [id number] DOB: [birth year]  73 y.o. year old female referred to Chronic Care Management by health plan. Last office visit with Birdie Sons, MD was 03/26/2018.    Was unable to reach patient via telephone today and have left HIPAA compliant voicemail asking patient to return my call. (unsuccessful outreach #2).  Follow up plan: A HIPPA compliant phone message was left for the patient providing contact information and requesting a return call.  The care management team will reach out to the patient again over the next 5-7 days.   Ruben Reason, PharmD Clinical Pharmacist [place] [phone]

## 2018-09-17 ENCOUNTER — Telehealth: Payer: Self-pay

## 2018-09-17 ENCOUNTER — Ambulatory Visit: Payer: Self-pay | Admitting: Pharmacist

## 2018-09-17 NOTE — Chronic Care Management (AMB) (Signed)
  Chronic Care Management   Note  09/17/2018 Name: ELMO SHUMARD MRN: [id number] DOB: 03-08-1945  73 y.o. year old female referred to Chronic Care Management team for clinical pharmacy services by health plan.   CCM team services are being closed due to three unsuccessful outreach attempts.   Patient has been provided CCM contact information if he/she wishes to engage with care managers in the future.   Ruben Reason, PharmD Clinical Pharmacist [place] [phone]

## 2018-10-01 ENCOUNTER — Encounter: Payer: Self-pay | Admitting: Family Medicine

## 2018-10-01 DIAGNOSIS — E785 Hyperlipidemia, unspecified: Secondary | ICD-10-CM | POA: Diagnosis not present

## 2018-10-01 DIAGNOSIS — E1121 Type 2 diabetes mellitus with diabetic nephropathy: Secondary | ICD-10-CM | POA: Diagnosis not present

## 2018-10-01 DIAGNOSIS — I10 Essential (primary) hypertension: Secondary | ICD-10-CM | POA: Diagnosis not present

## 2018-10-01 DIAGNOSIS — Z794 Long term (current) use of insulin: Secondary | ICD-10-CM | POA: Diagnosis not present

## 2018-10-01 DIAGNOSIS — M48061 Spinal stenosis, lumbar region without neurogenic claudication: Secondary | ICD-10-CM

## 2018-10-01 DIAGNOSIS — E1159 Type 2 diabetes mellitus with other circulatory complications: Secondary | ICD-10-CM | POA: Diagnosis not present

## 2018-10-01 DIAGNOSIS — F17200 Nicotine dependence, unspecified, uncomplicated: Secondary | ICD-10-CM | POA: Diagnosis not present

## 2018-10-01 DIAGNOSIS — E1169 Type 2 diabetes mellitus with other specified complication: Secondary | ICD-10-CM | POA: Diagnosis not present

## 2018-10-01 MED ORDER — HYDROCODONE-ACETAMINOPHEN 5-325 MG PO TABS: 1.0000 | ORAL_TABLET | Freq: Four times a day (QID) | ORAL | 0 refills | Status: DC | PRN

## 2018-10-05 ENCOUNTER — Encounter: Payer: Self-pay | Admitting: Family Medicine

## 2018-10-22 DIAGNOSIS — N2581 Secondary hyperparathyroidism of renal origin: Secondary | ICD-10-CM | POA: Diagnosis not present

## 2018-10-22 DIAGNOSIS — R809 Proteinuria, unspecified: Secondary | ICD-10-CM | POA: Diagnosis not present

## 2018-10-22 DIAGNOSIS — E1122 Type 2 diabetes mellitus with diabetic chronic kidney disease: Secondary | ICD-10-CM | POA: Diagnosis not present

## 2018-10-22 DIAGNOSIS — I129 Hypertensive chronic kidney disease with stage 1 through stage 4 chronic kidney disease, or unspecified chronic kidney disease: Secondary | ICD-10-CM | POA: Diagnosis not present

## 2018-10-22 DIAGNOSIS — N183 Chronic kidney disease, stage 3 (moderate): Secondary | ICD-10-CM | POA: Diagnosis not present

## 2018-10-24 ENCOUNTER — Encounter: Payer: Self-pay | Admitting: Family Medicine

## 2018-10-24 DIAGNOSIS — N2581 Secondary hyperparathyroidism of renal origin: Secondary | ICD-10-CM | POA: Insufficient documentation

## 2018-10-30 DIAGNOSIS — E1122 Type 2 diabetes mellitus with diabetic chronic kidney disease: Secondary | ICD-10-CM | POA: Diagnosis not present

## 2018-10-30 DIAGNOSIS — Z720 Tobacco use: Secondary | ICD-10-CM | POA: Diagnosis not present

## 2018-10-30 DIAGNOSIS — R809 Proteinuria, unspecified: Secondary | ICD-10-CM | POA: Diagnosis not present

## 2018-10-30 DIAGNOSIS — N2581 Secondary hyperparathyroidism of renal origin: Secondary | ICD-10-CM | POA: Diagnosis not present

## 2018-10-30 DIAGNOSIS — I10 Essential (primary) hypertension: Secondary | ICD-10-CM | POA: Diagnosis not present

## 2018-10-30 DIAGNOSIS — N183 Chronic kidney disease, stage 3 (moderate): Secondary | ICD-10-CM | POA: Diagnosis not present

## 2018-11-02 ENCOUNTER — Other Ambulatory Visit: Payer: Self-pay

## 2018-11-02 DIAGNOSIS — M48061 Spinal stenosis, lumbar region without neurogenic claudication: Secondary | ICD-10-CM

## 2018-11-02 MED ORDER — HYDROCODONE-ACETAMINOPHEN 5-325 MG PO TABS: 1.0000 | ORAL_TABLET | Freq: Four times a day (QID) | ORAL | 0 refills | Status: DC | PRN

## 2018-11-02 NOTE — Telephone Encounter (Signed)
Patient is requesting a medication refill on Hydrocodone

## 2018-11-02 NOTE — Telephone Encounter (Signed)
Please review. Thanks!  

## 2018-11-19 MED ORDER — EZETIMIBE 10 MG PO TABS: 10.0000 mg | ORAL_TABLET | Freq: Every day | ORAL | 1 refills | Status: DC

## 2018-11-28 DIAGNOSIS — I639 Cerebral infarction, unspecified: Secondary | ICD-10-CM | POA: Diagnosis not present

## 2018-11-28 DIAGNOSIS — R519 Headache, unspecified: Secondary | ICD-10-CM | POA: Diagnosis not present

## 2018-12-05 ENCOUNTER — Encounter: Payer: Self-pay | Admitting: Family Medicine

## 2018-12-10 ENCOUNTER — Other Ambulatory Visit: Payer: Self-pay

## 2018-12-10 DIAGNOSIS — M48061 Spinal stenosis, lumbar region without neurogenic claudication: Secondary | ICD-10-CM

## 2018-12-10 MED ORDER — HYDROCODONE-ACETAMINOPHEN 5-325 MG PO TABS: 1.0000 | ORAL_TABLET | Freq: Four times a day (QID) | ORAL | 0 refills | Status: DC | PRN

## 2018-12-10 NOTE — Telephone Encounter (Signed)
Patient sent refill request via [place]. Please review.

## 2018-12-30 NOTE — Progress Notes (Signed)
Date:  12/31/2018   ID:  [name], DOB Apr 21, 1945, MRN [id number]  Patient Location:  [address] [address]   Provider location:   [facility], [place] office  PCP:  Birdie Sons, MD  Cardiologist:  Arvid Right [facility]   Chief Complaint  Patient presents with  . Other    6 month follow up. Meds reviewed by the pt. verbally. Pt. c/o a torn retina on left eye and shortness of breath with little exertion.      History of Present Illness:    Hannah Kirby is a 73 y.o. female who presents via Engineer, civil (consulting) for a telehealth visit today.   The patient does not symptoms concerning for COVID-19 infection (fever, chills, cough, or new SHORTNESS OF BREATH).   Patient has a past medical history of diabetes,  obesity,  Anxiety, panic attacks significantly improved with  weight loss on Victoza,  coronary artery disease with stenting of her RCA in January 2008,  long smoking history who continues to smoke,  hyperlipidemia  outbreak of her granulomatous disease in summary 2013, prednisone for this condition.  History pinched nerve,  on pain medication. Chronic constipation Chronic shortness of breath, COPD who presents for routine followup of her coronary artery disease and hypertension.  Difficult time recently, suffering from a torn retina Symptoms started several weeks ago with floaters and vision deficits Has had laser treatment, planning to go to [facility] for follow-up  Reports having stressors at home with husband's health issues He has leg swelling, "blood clots,   Stomach issues, nausea, pain" On oxygen Husband driving, she is unable to see  Stopped norvasc, cardura BP was low.  Now running high.  Has not restarted any of the medications Monitors at home  Long history of smoking, continues to smoke Chronic shortness of breath, deconditioning  Last medical history reviewed Stress test 11/2017  The study is  normal.  This is a low risk study.  The left ventricular ejection fraction is normal (55-65%).  Previously had side effects on spironolactone, palpitations Was cutting the Cardura in half in the past secondary to dizziness On and off Norvasc was concerned this would make her dermatologic issues worse, turned out it did not have any effect   On aspirin Plavix for prior stroke 2018  EKG personally reviewed by myself on todays visit Shows normal sinus rhythm rate 76 bpm T wave abnormality V4 through V6 no change from prior EKGs   In the ER 09/2017 ABD pain, nausea constipated Constipation is a chronic issue and she is only taking Colace  Chronically elevated WBC   previous strokes on MRI  hospital with stroke 10/30/2016 Left upper extremity weakness on coming back from the beach while in the car She was on aspirin and Plavix at the time Echocardiogram normal ejection fractionno significant valve disease, moderate LVH, diastolic dysfunction  MRI brain: Small acute right basal ganglia region infarct. Prior strokes MRA: negative. Carotid duplex showed bilateral 0-49% stenosis.   Prior CV studies:   The following studies were reviewed today:    Past Medical History:  Diagnosis Date  . Anemia   . Arthritis   . CAD (coronary artery disease)    a. cath 02/2006: BMS x 2 to RCA, cath o/w without significant coronary disease; b. nuclear stress test 07/2014: no signs of ischemia, no ekg changes concerning for ischemia, low risk study/normal study  . Chronic bronchitis ([place])  secondary to cigarette smoking  . Chronic kidney disease (CKD), stage III (moderate)   . COPD (chronic obstructive pulmonary disease) ([place])   . Diabetes mellitus   . FHx: allergies   . GERD (gastroesophageal reflux disease)   . Goiter   . Granulomatous disease ([place])   . Hernia   . Hyperlipidemia   . Hypertension   . Kidney stone on left side 2013  . Microalbuminuria   . Obesity   . Smokers' cough  ([place])   . Spinal stenosis   . Stroke ([place]) 10/29/2016   mild left side weakness   Past Surgical History:  Procedure Laterality Date  . ABDOMINAL HYSTERECTOMY    . BREAST SURGERY    . CATARACT EXTRACTION W/PHACO Right 03/01/2017   Procedure: CATARACT EXTRACTION PHACO AND INTRAOCULAR LENS PLACEMENT ([place]) RIGHT DIABETIC;  Surgeon: Leandrew Koyanagi, MD;  Location: [place];  Service: Ophthalmology;  Laterality: Right;  . CATARACT EXTRACTION W/PHACO Left 03/22/2017   Procedure: CATARACT EXTRACTION PHACO AND INTRAOCULAR LENS PLACEMENT ([place]) LEFT DIABETIC;  Surgeon: Leandrew Koyanagi, MD;  Location: [place];  Service: Ophthalmology;  Laterality: Left;  Diabetic - insulin and oral meds  . COLONOSCOPY WITH PROPOFOL N/A 09/23/2014   Procedure: COLONOSCOPY WITH PROPOFOL;  Surgeon: Lollie Sails, MD;  Location: [facility] ENDOSCOPY;  Service: Endoscopy;  Laterality: N/A;  . COLONOSCOPY WITH PROPOFOL N/A 01/11/2018   Procedure: COLONOSCOPY WITH PROPOFOL;  Surgeon: Lollie Sails, MD;  Location: [facility] ENDOSCOPY;  Service: Endoscopy;  Laterality: N/A;  . COLONOSCOPY WITH PROPOFOL N/A 04/24/2018   Procedure: COLONOSCOPY WITH PROPOFOL;  Surgeon: Lollie Sails, MD;  Location[facility];  Service: Endoscopy;  Laterality: N/A;  . CORONARY ANGIOPLASTY WITH STENT PLACEMENT  2008  . ESOPHAGOGASTRODUODENOSCOPY (EGD) WITH PROPOFOL N/A 12/30/2014   Procedure: ESOPHAGOGASTRODUODENOSCOPY (EGD) WITH PROPOFOL;  Surgeon: Lollie Sails, MD;  Location[facility] ENDOSCOPY;  Service: Endoscopy;  Laterality: N/A;  . ESOPHAGOGASTRODUODENOSCOPY (EGD) WITH PROPOFOL N/A 07/19/2016   Procedure: ESOPHAGOGASTRODUODENOSCOPY (EGD) WITH PROPOFOL;  Surgeon: Lollie Sails, MD;  Location: [facility] ENDOSCOPY;  Service: Endoscopy;  Laterality: N/A;  . ESOPHAGOGASTRODUODENOSCOPY (EGD) WITH PROPOFOL N/A 04/24/2018   Procedure: ESOPHAGOGASTRODUODENOSCOPY (EGD) WITH PROPOFOL;  Surgeon: Lollie Sails, MD;   Location: [facility] ENDOSCOPY;  Service: Endoscopy;  Laterality: N/A;  . hysterectomy (other)       Current Meds  Medication Sig  . acetaminophen (TYLENOL) 500 MG tablet Take 500 mg by mouth every 6 (six) hours as needed.  Marland Kitchen albuterol (VENTOLIN HFA) 108 (90 Base) MCG/ACT inhaler Inhale 2 puffs into the lungs every 4 (four) hours as needed for wheezing or shortness of breath.  Marland Kitchen aspirin (ASPIR-81) 81 MG EC tablet Take 81 mg by mouth daily.    . clopidogrel (PLAVIX) 75 MG tablet Take 1 tablet (75 mg total) by mouth daily.  . cyanocobalamin 1000 MCG tablet Take 1,000 mcg by mouth daily.  Marland Kitchen ezetimibe (ZETIA) 10 MG tablet Take 1 tablet (10 mg total) by mouth daily.  . fluticasone furoate-vilanterol (BREO ELLIPTA) 100-25 MCG/INH AEPB Inhale 1 puff into the lungs daily.  Marland Kitchen HYDROcodone-acetaminophen (NORCO/VICODIN) 5-325 MG tablet Take 1 tablet by mouth every 6 (six) hours as needed.  . insulin glargine (LANTUS) 100 UNIT/ML injection Inject 12 Units into the skin daily.   . Liraglutide (VICTOZA) 18 MG/3ML SOLN injection Inject 1.8 mg into the skin daily.   Marland Kitchen lisinopril (PRINIVIL,ZESTRIL) 20 MG tablet Take 1 tablet (20 mg total) by mouth daily.  . meclizine (ANTIVERT) 25 MG  tablet Take by mouth as needed.   . metoprolol tartrate (LOPRESSOR) 100 MG tablet Take 1 tablet (100 mg total) by mouth 2 (two) times daily.  . Multiple Vitamins-Minerals (DAILY MULTI) TABS Take 1 tablet by mouth daily.   . nitroGLYCERIN (NITROSTAT) 0.4 MG SL tablet Place 1 tablet (0.4 mg total) under the tongue every 5 (five) minutes as needed.  . Omega-3 Fatty Acids (FISH OIL) 1000 MG CAPS Take 2 capsules by mouth 2 (two) times daily. Take 4  . omeprazole (PRILOSEC) 40 MG capsule Take 40 mg by mouth daily as needed.  . ondansetron (ZOFRAN ODT) 4 MG disintegrating tablet Take 1 tablet (4 mg total) by mouth every 8 (eight) hours as needed for nausea or vomiting.  . RABEprazole (ACIPHEX) 20 MG tablet Take 20 mg by mouth daily.  .  rosuvastatin (CRESTOR) 5 MG tablet Take 1 tablet (5 mg total) by mouth daily.  . sucralfate (CARAFATE) 1 g tablet Take 1 g by mouth 2 (two) times daily.  Marland Kitchen tiotropium (SPIRIVA) 18 MCG inhalation capsule Place 1 capsule (18 mcg total) into inhaler and inhale daily.     Allergies:   Spironolactone, Sulfonamide derivatives, Amlodipine, Other, Statins, Sulfa antibiotics, Codeine, and Prednisone   Social History   Tobacco Use  . Smoking status: Current Every Day Smoker    Packs/day: 0.75    Years: 40.00    Pack years: 30.00    Types: Cigarettes  . Smokeless tobacco: Never Used  Substance Use Topics  . Alcohol use: Yes    Comment: 1/month- wine  . Drug use: No      Family Hx: The patient's family history includes Breast cancer in her maternal aunt; Congestive Heart Failure in her mother; Diabetes in her mother; Heart failure in her mother; Lung cancer in her father; Stroke in her mother.  ROS:   Please see the history of present illness.    Review of Systems  Constitutional: Negative.   Eyes: Positive for blurred vision.  Respiratory: Positive for shortness of breath.   Cardiovascular: Negative.   Gastrointestinal: Negative.   Musculoskeletal: Negative.   Skin: Positive for rash.  Neurological: Negative.   Psychiatric/Behavioral: Negative.   All other systems reviewed and are negative.     Labs/Other Tests and Data Reviewed:    Recent Labs: 05/09/2018: BUN 24; Creatinine, Ser 1.51; Potassium 3.4; Sodium 142   Recent Lipid Panel Lab Results  Component Value Date/Time   CHOL 129 10/30/2016 05:15 AM   CHOL 79 (L) 05/12/2016 07:48 AM   TRIG 118 10/30/2016 05:15 AM   HDL 52 10/30/2016 05:15 AM   HDL 28 (L) 05/12/2016 07:48 AM   CHOLHDL 2.5 10/30/2016 05:15 AM   LDLCALC 53 10/30/2016 05:15 AM   LDLCALC 17 05/12/2016 07:48 AM    Wt Readings from Last 3 Encounters:  12/31/18 165 lb (74.8 kg)  07/03/18 166 lb (75.3 kg)  04/24/18 168 lb (76.2 kg)     Exam:    Vital  Signs: Vital signs may also be detailed in the HPI BP (!) 158/90 (BP Location: Left Arm, Patient Position: Sitting, Cuff Size: Normal)   Pulse 76   Ht 5\' 6"  (1.676 m)   Wt 165 lb (74.8 kg)   BMI 26.63 kg/m    Constitutional:  oriented to person, place, and time. No distress.  HENT:  Head: Grossly normal Eyes:  no discharge. No scleral icterus.  Neck: No JVD, no carotid bruits  Cardiovascular: Regular rate and rhythm, no murmurs  appreciated Pulmonary/Chest: Clear to auscultation bilaterally, no wheezes or rails Abdominal: Soft.  no distension.  no tenderness.  Musculoskeletal: Normal range of motion Neurological:  normal muscle tone. Coordination normal. No atrophy Skin: Skin warm and dry Psychiatric: normal affect, pleasant   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 (HCC) Currently with no symptoms of angina. No further workup at this time. Continue current medication regimen.  Dyspnea, unspecified type Long time smoker, chronic shortness of breath with underlying COPD Smoking cessation recommended  Essential hypertension She has stopped amlodipine and Cardura now blood pressure high Recommend she restart amlodipine 5 mg daily Will likely need at least 10 mg  Renal insufficiency Stable renal function  Hyperlipidemia LDL goal <70 Cholesterol is at goal on the current lipid regimen. No changes to the medications were made.  Stable  Chronic obstructive pulmonary disease, unspecified COPD type ([place]) Recommend smoking cessation Management per primary care  Not on oxygen  Vision loss Problem with her retina, had laser procedure has follow-up with [facility]     Disposition: Follow-up in 6 months   Signed, Ida Rogue, MD  12/31/2018 2:56 PM    [place] [id number], [place], [place] [address]

## 2018-12-31 ENCOUNTER — Encounter: Payer: Self-pay | Admitting: Cardiovascular Disease

## 2018-12-31 ENCOUNTER — Ambulatory Visit (INDEPENDENT_AMBULATORY_CARE_PROVIDER_SITE_OTHER): Payer: Medicare Other | Admitting: Cardiovascular Disease

## 2018-12-31 ENCOUNTER — Other Ambulatory Visit: Payer: Self-pay

## 2018-12-31 VITALS — BP 158/90 | HR 76 | Ht 66.0 in | Wt 165.0 lb

## 2018-12-31 DIAGNOSIS — E785 Hyperlipidemia, unspecified: Secondary | ICD-10-CM | POA: Diagnosis not present

## 2018-12-31 DIAGNOSIS — F17200 Nicotine dependence, unspecified, uncomplicated: Secondary | ICD-10-CM | POA: Diagnosis not present

## 2018-12-31 DIAGNOSIS — I25118 Atherosclerotic heart disease of native coronary artery with other forms of angina pectoris: Secondary | ICD-10-CM

## 2018-12-31 DIAGNOSIS — I2510 Atherosclerotic heart disease of native coronary artery without angina pectoris: Secondary | ICD-10-CM | POA: Diagnosis not present

## 2018-12-31 DIAGNOSIS — J449 Chronic obstructive pulmonary disease, unspecified: Secondary | ICD-10-CM

## 2018-12-31 DIAGNOSIS — D3132 Benign neoplasm of left choroid: Secondary | ICD-10-CM | POA: Diagnosis not present

## 2018-12-31 DIAGNOSIS — I10 Essential (primary) hypertension: Secondary | ICD-10-CM | POA: Diagnosis not present

## 2018-12-31 LAB — HM DIABETES EYE EXAM

## 2018-12-31 MED ORDER — ROSUVASTATIN CALCIUM 5 MG PO TABS: 5.0000 mg | ORAL_TABLET | Freq: Every day | ORAL | 3 refills | Status: DC

## 2018-12-31 MED ORDER — CLOPIDOGREL BISULFATE 75 MG PO TABS: 75.0000 mg | ORAL_TABLET | Freq: Every day | ORAL | 3 refills | Status: DC

## 2018-12-31 MED ORDER — METOPROLOL TARTRATE 100 MG PO TABS: 100.0000 mg | ORAL_TABLET | Freq: Two times a day (BID) | ORAL | 3 refills | Status: DC

## 2018-12-31 MED ORDER — EZETIMIBE 10 MG PO TABS: 10.0000 mg | ORAL_TABLET | Freq: Every day | ORAL | 1 refills | Status: DC

## 2018-12-31 MED ORDER — LISINOPRIL 20 MG PO TABS: 20.0000 mg | ORAL_TABLET | Freq: Every day | ORAL | 3 refills | Status: DC

## 2018-12-31 NOTE — Patient Instructions (Addendum)
Medication Instructions:  - Your physician has recommended you make the following change in your medication:   1) Restart norvasc 5 mg- take 1 tablet by mouth once daily  - Monitor BP - If still elevated, you might need a 10 mg pill - Goal 120- 130s/60 to 80  If you need a refill on your cardiac medications before your next appointment, please call your pharmacy.    Lab work: No new labs needed   If you have labs (blood work) drawn today and your tests are completely normal, you will receive your results only by: Marland Kitchen MyChart Message (if you have MyChart) OR . A paper copy in the mail If you have any lab test that is abnormal or we need to change your treatment, we will call you to review the results.   Testing/Procedures: No new testing needed   Follow-Up: At [facility], you and your health needs are our priority.  As part of our continuing mission to provide you with exceptional heart care, we have created designated Provider Care Teams.  These Care Teams include your primary Cardiologist (physician) and Advanced Practice Providers (APPs -  Physician Assistants and Nurse Practitioners) who all work together to provide you with the care you need, when you need it.  . You will need a follow up appointment in 6 months, virtual ok .  Marland Kitchen Providers on your designated Care Team:   . Murray Hodgkins, NP . Christell Faith, PA-C . Marrianne Mood, PA-C  Any Other Special Instructions Will Be Listed Below (If Applicable).  For educational health videos Log in to : [email or web address] Or : [email or web address], password : [email or web address]

## 2019-01-11 DIAGNOSIS — E785 Hyperlipidemia, unspecified: Secondary | ICD-10-CM | POA: Diagnosis not present

## 2019-01-11 DIAGNOSIS — Z794 Long term (current) use of insulin: Secondary | ICD-10-CM | POA: Diagnosis not present

## 2019-01-11 DIAGNOSIS — E1159 Type 2 diabetes mellitus with other circulatory complications: Secondary | ICD-10-CM | POA: Diagnosis not present

## 2019-01-11 DIAGNOSIS — E1169 Type 2 diabetes mellitus with other specified complication: Secondary | ICD-10-CM | POA: Diagnosis not present

## 2019-01-11 DIAGNOSIS — E1121 Type 2 diabetes mellitus with diabetic nephropathy: Secondary | ICD-10-CM | POA: Diagnosis not present

## 2019-01-11 DIAGNOSIS — I10 Essential (primary) hypertension: Secondary | ICD-10-CM | POA: Diagnosis not present

## 2019-01-16 ENCOUNTER — Other Ambulatory Visit: Payer: Self-pay

## 2019-01-16 ENCOUNTER — Encounter: Payer: Self-pay | Admitting: Family Medicine

## 2019-01-16 DIAGNOSIS — M48061 Spinal stenosis, lumbar region without neurogenic claudication: Secondary | ICD-10-CM

## 2019-01-16 MED ORDER — HYDROCODONE-ACETAMINOPHEN 5-325 MG PO TABS: 1.0000 | ORAL_TABLET | Freq: Four times a day (QID) | ORAL | 0 refills | Status: DC | PRN

## 2019-02-12 ENCOUNTER — Ambulatory Visit (INDEPENDENT_AMBULATORY_CARE_PROVIDER_SITE_OTHER): Payer: Medicare Other

## 2019-02-12 ENCOUNTER — Ambulatory Visit (INDEPENDENT_AMBULATORY_CARE_PROVIDER_SITE_OTHER): Payer: Medicare Other | Admitting: Vascular Surgery

## 2019-02-12 ENCOUNTER — Encounter (INDEPENDENT_AMBULATORY_CARE_PROVIDER_SITE_OTHER): Payer: Self-pay | Admitting: Vascular Surgery

## 2019-02-12 ENCOUNTER — Other Ambulatory Visit: Payer: Self-pay

## 2019-02-12 VITALS — BP 155/83 | HR 73 | Resp 16 | Wt 166.0 lb

## 2019-02-12 DIAGNOSIS — N183 Chronic kidney disease, stage 3 unspecified: Secondary | ICD-10-CM

## 2019-02-12 DIAGNOSIS — Z720 Tobacco use: Secondary | ICD-10-CM

## 2019-02-12 DIAGNOSIS — I2510 Atherosclerotic heart disease of native coronary artery without angina pectoris: Secondary | ICD-10-CM | POA: Diagnosis not present

## 2019-02-12 DIAGNOSIS — F17200 Nicotine dependence, unspecified, uncomplicated: Secondary | ICD-10-CM | POA: Diagnosis not present

## 2019-02-12 DIAGNOSIS — E0821 Diabetes mellitus due to underlying condition with diabetic nephropathy: Secondary | ICD-10-CM

## 2019-02-12 DIAGNOSIS — I10 Essential (primary) hypertension: Secondary | ICD-10-CM

## 2019-02-12 DIAGNOSIS — I714 Abdominal aortic aneurysm, without rupture, unspecified: Secondary | ICD-10-CM

## 2019-02-12 DIAGNOSIS — E7849 Other hyperlipidemia: Secondary | ICD-10-CM | POA: Diagnosis not present

## 2019-02-12 NOTE — Progress Notes (Signed)
MRN : [id number]  Hannah Kirby is a 73 y.o. ([birth year]) female who presents with chief complaint of No chief complaint on file. Marland Kitchen  History of Present Illness: Patient returns today in follow up of her abdominal aortic aneurysm.  She is doing reasonably well today without any new complaints.  Her husband is had a lot of medical issues this past year.  She has not really had any major issues.  She denies any aneurysm related symptoms at this time. Specifically, the patient denies new back or abdominal pain, or signs of peripheral embolization.  Her aortic duplex today reveals stable 3.4 cm abdominal aortic aneurysm without significant progression from her previous studies  Current Outpatient Medications  Medication Sig Dispense Refill  . acetaminophen (TYLENOL) 500 MG tablet Take 500 mg by mouth every 6 (six) hours as needed.    Marland Kitchen albuterol (VENTOLIN HFA) 108 (90 Base) MCG/ACT inhaler Inhale 2 puffs into the lungs every 4 (four) hours as needed for wheezing or shortness of breath. 48 g 3  . amLODipine (NORVASC) 5 MG tablet Take 1 tablet (5 mg total) by mouth daily. 90 tablet 3  . aspirin (ASPIR-81) 81 MG EC tablet Take 81 mg by mouth daily.      . clopidogrel (PLAVIX) 75 MG tablet Take 1 tablet (75 mg total) by mouth daily. 90 tablet 3  . cyanocobalamin 1000 MCG tablet Take 1,000 mcg by mouth daily.    Marland Kitchen ezetimibe (ZETIA) 10 MG tablet Take 1 tablet (10 mg total) by mouth daily. 90 tablet 1  . fluticasone furoate-vilanterol (BREO ELLIPTA) 100-25 MCG/INH AEPB Inhale 1 puff into the lungs daily. 84 each 3  . HYDROcodone-acetaminophen (NORCO/VICODIN) 5-325 MG tablet Take 1 tablet by mouth every 6 (six) hours as needed. 120 tablet 0  . insulin glargine (LANTUS) 100 UNIT/ML injection Inject 18 Units into the skin daily.     . Liraglutide (VICTOZA) 18 MG/3ML SOLN injection Inject 1.8 mg into the skin daily.     Marland Kitchen lisinopril (ZESTRIL) 20 MG tablet Take 1 tablet (20 mg total) by mouth daily. 90  tablet 3  . meclizine (ANTIVERT) 25 MG tablet Take by mouth as needed.     . metoprolol tartrate (LOPRESSOR) 100 MG tablet Take 1 tablet (100 mg total) by mouth 2 (two) times daily. 180 tablet 3  . Multiple Vitamins-Minerals (DAILY MULTI) TABS Take 1 tablet by mouth daily.     . nitroGLYCERIN (NITROSTAT) 0.4 MG SL tablet Place 1 tablet (0.4 mg total) under the tongue every 5 (five) minutes as needed. 25 tablet 6  . Omega-3 Fatty Acids (FISH OIL) 1000 MG CAPS Take 2 capsules by mouth 2 (two) times daily. Take 4    . omeprazole (PRILOSEC) 40 MG capsule Take 40 mg by mouth daily as needed.    . ondansetron (ZOFRAN ODT) 4 MG disintegrating tablet Take 1 tablet (4 mg total) by mouth every 8 (eight) hours as needed for nausea or vomiting. 20 tablet 0  . RABEprazole (ACIPHEX) 20 MG tablet Take 20 mg by mouth daily.    . rosuvastatin (CRESTOR) 5 MG tablet Take 1 tablet (5 mg total) by mouth daily. 90 tablet 3  . sucralfate (CARAFATE) 1 g tablet Take 1 g by mouth 2 (two) times daily.    Marland Kitchen tiotropium (SPIRIVA) 18 MCG inhalation capsule Place 1 capsule (18 mcg total) into inhaler and inhale daily. 90 capsule 4   No current facility-administered medications for this visit.  Past Medical History:  Diagnosis Date  . Anemia   . Arthritis   . CAD (coronary artery disease)    a. cath 02/2006: BMS x 2 to RCA, cath o/w without significant coronary disease; b. nuclear stress test 07/2014: no signs of ischemia, no ekg changes concerning for ischemia, low risk study/normal study  . Chronic bronchitis ([place])    secondary to cigarette smoking  . Chronic kidney disease (CKD), stage III (moderate)   . COPD (chronic obstructive pulmonary disease) ([place])   . Diabetes mellitus   . FHx: allergies   . GERD (gastroesophageal reflux disease)   . Goiter   . Granulomatous disease ([place])   . Hernia   . Hyperlipidemia   . Hypertension   . Kidney stone on left side 2013  . Microalbuminuria   . Obesity   . Smokers' cough  ([place])   . Spinal stenosis   . Stroke ([place]) 10/29/2016   mild left side weakness    Past Surgical History:  Procedure Laterality Date  . ABDOMINAL HYSTERECTOMY    . BREAST SURGERY    . CATARACT EXTRACTION W/PHACO Right 03/01/2017   Procedure: CATARACT EXTRACTION PHACO AND INTRAOCULAR LENS PLACEMENT ([place]) RIGHT DIABETIC;  Surgeon: Leandrew Koyanagi, MD;  Location: [place];  Service: Ophthalmology;  Laterality: Right;  . CATARACT EXTRACTION W/PHACO Left 03/22/2017   Procedure: CATARACT EXTRACTION PHACO AND INTRAOCULAR LENS PLACEMENT ([place]) LEFT DIABETIC;  Surgeon: Leandrew Koyanagi, MD;  Location: [place];  Service: Ophthalmology;  Laterality: Left;  Diabetic - insulin and oral meds  . COLONOSCOPY WITH PROPOFOL N/A 09/23/2014   Procedure: COLONOSCOPY WITH PROPOFOL;  Surgeon: Lollie Sails, MD;  Location: [facility] ENDOSCOPY;  Service: Endoscopy;  Laterality: N/A;  . COLONOSCOPY WITH PROPOFOL N/A 01/11/2018   Procedure: COLONOSCOPY WITH PROPOFOL;  Surgeon: Lollie Sails, MD;  Location[facility] ENDOSCOPY;  Service: Endoscopy;  Laterality: N/A;  . COLONOSCOPY WITH PROPOFOL N/A 04/24/2018   Procedure: COLONOSCOPY WITH PROPOFOL;  Surgeon: Lollie Sails, MD;  Location: [facility] ENDOSCOPY;  Service: Endoscopy;  Laterality: N/A;  . CORONARY ANGIOPLASTY WITH STENT PLACEMENT  2008  . ESOPHAGOGASTRODUODENOSCOPY (EGD) WITH PROPOFOL N/A 12/30/2014   Procedure: ESOPHAGOGASTRODUODENOSCOPY (EGD) WITH PROPOFOL;  Surgeon: Lollie Sails, MD;  Location: [facility] ENDOSCOPY;  Service: Endoscopy;  Laterality: N/A;  . ESOPHAGOGASTRODUODENOSCOPY (EGD) WITH PROPOFOL N/A 07/19/2016   Procedure: ESOPHAGOGASTRODUODENOSCOPY (EGD) WITH PROPOFOL;  Surgeon: Lollie Sails, MD;  Location: [facility] ENDOSCOPY;  Service: Endoscopy;  Laterality: N/A;  . ESOPHAGOGASTRODUODENOSCOPY (EGD) WITH PROPOFOL N/A 04/24/2018   Procedure: ESOPHAGOGASTRODUODENOSCOPY (EGD) WITH PROPOFOL;  Surgeon: Lollie Sails, MD;   Location: [facility] ENDOSCOPY;  Service: Endoscopy;  Laterality: N/A;  . hysterectomy (other)      Social History        Tobacco Use  . Smoking status: Current Every Day Smoker    Packs/day: 0.50    Years: 40.00    Pack years: 20.00    Types: Cigarettes  . Smokeless tobacco: Never Used  Substance Use Topics  . Alcohol use: Yes    Comment: 1/month- wine  . Drug use: No     Family History      Family History  Problem Relation Age of Onset  . Heart failure Mother   . Stroke Mother   . Diabetes Mother   . Congestive Heart Failure Mother   . Lung cancer Father   . Breast cancer Maternal Aunt           Allergies  Allergen Reactions  .  Sulfonamide Derivatives Other (See Comments)    Last taken as a child; made her mouth break out  . Statins Other (See Comments)    Caused elevated liver function studies  . Codeine Nausea And Vomiting  . Prednisone Palpitations and Other (See Comments)    Made her legs "feel weird."     REVIEW OF SYSTEMS(Negative unless checked)  Constitutional: [] ??Weight loss[] ??Fever[] ??Chills Cardiac:[] ??Chest pain[] ??Chest pressure[] ??Palpitations [] ??Shortness of breath when laying flat [] ??Shortness of breath at rest [x] ??Shortness of breath with exertion. Vascular: [] ??Pain in legs with walking[] ??Pain in legsat rest[] ??Pain in legs when laying flat [] ??Claudication [] ??Pain in feet when walking [] ??Pain in feet at rest [] ??Pain in feet when laying flat [] ??History of DVT [] ??Phlebitis [] ??Swelling in legs [] ??Varicose veins [] ??Non-healing ulcers Pulmonary: [] ??Uses home oxygen [] ??Productive cough[] ??Hemoptysis [] ??Wheeze [] ??COPD [] ??Asthma Neurologic: [x] ??Dizziness [] ??Blackouts [] ??Seizures [] ??History of stroke [] ??History of TIA[] ??Aphasia [] ??Temporary blindness[] ??Dysphagia [] ??Weaknessor numbness in arms [] ??Weakness or  numbnessin legs Musculoskeletal: [x] ??Arthritis [] ??Joint swelling [] ??Joint pain [] ??Low back pain Hematologic:[] ??Easy bruising[] ??Easy bleeding [] ??Hypercoagulable state [] ??Anemic [] ??Hepatitis Gastrointestinal:[] ??Blood in stool[] ??Vomiting blood[] ??Gastroesophageal reflux/heartburn[x] ??Abdominal pain Genitourinary: [] ??Chronic kidney disease [] ??Difficulturination [] ??Frequenturination [] ??Burning with urination[] ??Hematuria Skin: [] ??Rashes [] ??Ulcers [] ??Wounds Psychological: [] ??History of anxiety[] ??History of major depression.    Physical Examination  BP (!) 155/83 (BP Location: Right Arm)   Pulse 73   Resp 16   Wt 166 lb (75.3 kg)   BMI 26.79 kg/m  Gen:  WD/WN, NAD Head: [place]/AT, No temporalis wasting. Ear/Nose/Throat: Hearing grossly intact, nares w/o erythema or drainage Eyes: Conjunctiva clear. Sclera non-icteric Neck: Supple.  Trachea midline Pulmonary:  Good air movement, no use of accessory muscles.  Cardiac: RRR, no JVD Vascular:  Vessel Right Left  Radial Palpable Palpable                                   Gastrointestinal: soft, non-tender/non-distended. No increased aortic impulse Musculoskeletal: M/S 5/5 throughout.  No deformity or atrophy. No edema. Neurologic: Sensation grossly intact in extremities.  Symmetrical.  Speech is fluent.  Psychiatric: Judgment intact, Mood & affect appropriate for pt's clinical situation. Dermatologic: No rashes or ulcers noted.  No cellulitis or open wounds.       Labs Recent Results (from the past 2160 hour(s))  HM DIABETES EYE EXAM     Status: None   Collection Time: 12/31/18 12:00 AM  Result Value Ref Range   HM Diabetic Eye Exam No Retinopathy No Retinopathy    Radiology No results found.  Assessment/Plan Chronic kidney disease, stage 3 Try to avoid contrast with evaluation as much as possible. Plan duplex  Hyperlipidemia lipid control important in  reducing the progression of atherosclerotic disease. Continue statin therapy   Smoking We had a discussion for approximately3-63minutes regarding the absolute need for smoking cessation due to the deleterious nature of tobacco on the vascular system. Particularly in her case, it increase likelihood an aneurysmal growth.We discussed the tobacco use would diminish patency of any intervention, and likely significantly worsen progressio of disease. We discussed multiple agents for quitting including replacement therapy or medications to reduce cravings such as Chantix. The patient voices their understanding of the importance of smoking cessation.   Benign essential HTN blood pressure control important in reducing the progression of atherosclerotic diseaseand aneurysmal degeneration. On appropriate oral medications.  No problem-specific Assessment & Plan notes found for this encounter.    Leotis Pain, MD  02/12/2019 9:01 AM    This note was created with Dragon medical transcription system.  Any errors from dictation are purely unintentional

## 2019-02-12 NOTE — Assessment & Plan Note (Signed)
Her aortic duplex today reveals stable 3.4 cm abdominal aortic aneurysm without significant progression from her previous studies.  Again discussed the importance of smoking cessation and blood pressure control.  Continue to follow on an annual basis.

## 2019-02-12 NOTE — Patient Instructions (Signed)
Abdominal Aortic Aneurysm  An aneurysm is a bulge in one of the blood vessels that carry blood away from the heart (artery). It happens when blood pushes up against a weak or damaged place in the wall of an artery. An abdominal aortic aneurysm happens in the main artery of the body (aorta). Some aneurysms may not cause problems. If it grows, it can burst or tear, causing bleeding inside the body. This is an emergency. It needs to be treated right away. What are the causes? The exact cause of this condition is not known. What increases the risk? The following may make you more likely to get this condition:  Being a female who is 60 years of age or older.  Being white (Caucasian).  Using tobacco.  Having a family history of aneurysms.  Having the following conditions: ? Hardening of the arteries (arteriosclerosis). ? Inflammation of the walls of an artery (arteritis). ? Certain genetic conditions. ? Being very overweight (obesity). ? An infection in the wall of the aorta (infectious aortitis). ? High cholesterol. ? High blood pressure (hypertension). What are the signs or symptoms? Symptoms depend on the size of the aneurysm and how fast it is growing. Most grow slowly and do not cause any symptoms. If symptoms do occur, they may include:  Pain in the belly (abdomen), side, or back.  Feeling full after eating only small amounts of food.  Feeling a throbbing lump in the belly. Symptoms that the aneurysm has burst (ruptured) include:  Sudden, very bad pain in the belly, side, or back.  Feeling sick to your stomach (nauseous).  Throwing up (vomiting).  Feeling light-headed or passing out. How is this treated? Treatment for this condition depends on:  The size of the aneurysm.  How fast it is growing.  Your age.  Your risk of having it burst. If your aneurysm is smaller than 2 inches (5 cm), your doctor may manage it by:  Checking it often to see if it is getting bigger.  You may have an imaging test (ultrasound) to check it every 3-6 months, every year, or every few years.  Giving you medicines to: ? Control blood pressure. ? Treat pain. ? Fight infection. If your aneurysm is larger than 2 inches (5 cm), you may need surgery to fix it. Follow these instructions at home: Lifestyle  Do not use any products that have nicotine or tobacco in them. This includes cigarettes, e-cigarettes, and chewing tobacco. If you need help quitting, ask your doctor.  Get regular exercise. Ask your doctor what types of exercise are best for you. Eating and drinking  Eat a heart-healthy diet. This includes eating plenty of: ? Fresh fruits and vegetables. ? Whole grains. ? Low-fat (lean) protein. ? Low-fat dairy products.  Avoid foods that are high in saturated fat and cholesterol. These foods include red meat and some dairy products.  Do not drink alcohol if: ? Your doctor tells you not to drink. ? You are pregnant, may be pregnant, or are planning to become pregnant.  If you drink alcohol: ? Limit how much you use to:  0-1 drink a day for women.  0-2 drinks a day for men. ? Be aware of how much alcohol is in your drink. In the [place] one drink equals any of these:  One typical bottle of beer (12 oz).  One-half glass of wine (5 oz).  One shot of hard liquor (1 oz). General instructions  Take over-the-counter and prescription medicines only as   told by your doctor.  Keep your blood pressure within normal limits. Ask your doctor what your blood pressure should be.  Have your blood sugar (glucose) level and cholesterol levels checked regularly. Keep your blood sugar level and cholesterol levels within normal limits.  Avoid heavy lifting and activities that take a lot of effort. Ask your doctor what activities are safe for you.  Keep all follow-up visits as told by your doctor. This is important. ? Talk to your doctor about regular screenings to see if the  aneurysm is getting bigger. Contact a doctor if you:  Have pain in your belly, side, or back.  Have a throbbing feeling in your belly.  Have a family history of aneurysms. Get help right away if you:  Have sudden, bad pain in your belly, side, or back.  Feel sick to your stomach.  Throw up.  Have trouble pooping (constipation).  Have trouble peeing (urinating).  Feel light-headed.  Have a fast heart rate when you stand.  Have sweaty skin that is cold to the touch (clammy).  Have shortness of breath.  Have a fever. These symptoms may be an emergency. Do not wait to see if the symptoms will go away. Get medical help right away. Call your local emergency services (911 in the [place] Do not drive yourself to the hospital. Summary  An aneurysm is a bulge in one of the blood vessels that carry blood away from the heart (artery). Some aneurysms may not cause problems.  You may need to have yours checked often. If it grows, it can burst or tear. This causes bleeding inside the body. It needs to be treated right away.  Follow instructions from your doctor about healthy lifestyle changes.  Keep all follow-up visits as told by your doctor. This is important. This information is not intended to replace advice given to you by your health care provider. Make sure you discuss any questions you have with your health care provider. Document Released: 06/04/2012 Document Revised: 05/28/2018 Document Reviewed: 09/16/2017 Elsevier Patient Education  2020 Elsevier Inc.  

## 2019-02-21 ENCOUNTER — Other Ambulatory Visit: Payer: Self-pay | Admitting: Physician Assistant

## 2019-02-21 DIAGNOSIS — M48061 Spinal stenosis, lumbar region without neurogenic claudication: Secondary | ICD-10-CM

## 2019-02-21 MED ORDER — HYDROCODONE-ACETAMINOPHEN 5-325 MG PO TABS: 1.0000 | ORAL_TABLET | Freq: Four times a day (QID) | ORAL | 0 refills | Status: DC | PRN

## 2019-03-26 ENCOUNTER — Encounter: Payer: Self-pay | Admitting: Family Medicine

## 2019-03-27 ENCOUNTER — Other Ambulatory Visit: Payer: Self-pay

## 2019-03-27 DIAGNOSIS — M48061 Spinal stenosis, lumbar region without neurogenic claudication: Secondary | ICD-10-CM

## 2019-03-27 MED ORDER — HYDROCODONE-ACETAMINOPHEN 5-325 MG PO TABS: 1.0000 | ORAL_TABLET | Freq: Four times a day (QID) | ORAL | 0 refills | Status: DC | PRN

## 2019-03-27 NOTE — Telephone Encounter (Signed)
Patient sent [facility] message requesting refills.

## 2019-04-25 DIAGNOSIS — E1122 Type 2 diabetes mellitus with diabetic chronic kidney disease: Secondary | ICD-10-CM | POA: Insufficient documentation

## 2019-04-25 DIAGNOSIS — I129 Hypertensive chronic kidney disease with stage 1 through stage 4 chronic kidney disease, or unspecified chronic kidney disease: Secondary | ICD-10-CM | POA: Insufficient documentation

## 2019-04-30 ENCOUNTER — Encounter: Payer: Self-pay | Admitting: Family Medicine

## 2019-04-30 DIAGNOSIS — M48061 Spinal stenosis, lumbar region without neurogenic claudication: Secondary | ICD-10-CM

## 2019-04-30 MED ORDER — HYDROCODONE-ACETAMINOPHEN 5-325 MG PO TABS: 1.0000 | ORAL_TABLET | Freq: Four times a day (QID) | ORAL | 0 refills | Status: DC | PRN

## 2019-05-08 ENCOUNTER — Other Ambulatory Visit: Payer: Self-pay

## 2019-05-08 ENCOUNTER — Ambulatory Visit (INDEPENDENT_AMBULATORY_CARE_PROVIDER_SITE_OTHER): Payer: Medicare Other | Admitting: Physician Assistant

## 2019-05-08 ENCOUNTER — Encounter: Payer: Self-pay | Admitting: Physician Assistant

## 2019-05-08 VITALS — BP 130/80 | HR 74 | Temp 96.9°F | Wt 174.4 lb

## 2019-05-08 DIAGNOSIS — M25551 Pain in right hip: Secondary | ICD-10-CM

## 2019-05-08 DIAGNOSIS — M5136 Other intervertebral disc degeneration, lumbar region: Secondary | ICD-10-CM | POA: Diagnosis not present

## 2019-05-08 DIAGNOSIS — I714 Abdominal aortic aneurysm, without rupture, unspecified: Secondary | ICD-10-CM

## 2019-05-08 NOTE — Progress Notes (Signed)
Patient: Hannah Kirby Female    DOB: 06-24-1945   74 y.o.   MRN: [id number] Visit Date: 05/08/2019  Today's Provider: Trinna Post, PA-C   Chief Complaint  Patient presents with  . Hip Pain   Subjective:     Hip Pain  The incident occurred 3 to 5 days ago. There was no injury mechanism. The pain is present in the right hip and right thigh. The pain is at a severity of 7/10 (10/10 if not on any pain medication per pt). The pain is moderate. The pain has been fluctuating since onset. Associated symptoms include an inability to bear weight. Pertinent negatives include no numbness or tingling. She reports no foreign bodies present. The symptoms are aggravated by weight bearing and movement. She has tried non-weight bearing and acetaminophen for the symptoms. The treatment provided mild relief.   Patient has a history of spinal stenosis and lumbar DDD with right sided disc bulging and facet hypertrophy. She is currently being treated with hydrocodone for spinal stenosis. When she takes this, it does help. The pain is located in her right leg and occurs when she is standing and improves when she sits down. She has a 3.4 cm AAA that has been stable over the past years, most recently imaged in 01/2019. She is concerned this pain may be related to her AAA.   CT Lumbar Spine on 07/2013 with findings as below   IMPRESSION: LUMBAR MYELOGRAM IMPRESSION:  1. Levoconvex scoliosis of the lumbar spine is centered at L3-4. 2. Asymmetric right lateral recess narrowing at L3-4 with a right paramedian disk protrusion. 3. Mild disc bulging at L4-5 is exaggerated with standing. The nerve roots fill normally. 4. Transitional anatomy as described. Allergies  Allergen Reactions  . Spironolactone Shortness Of Breath  . Sulfonamide Derivatives Other (See Comments)    Last taken as a child; made her mouth break out  . Other Other (See Comments)    Mouth blisters  . Statins Other (See  Comments)    Caused elevated liver function studies (02/22/17-pt taking rosuvastatin without problems) Caused elevated liver function studies Caused elevated liver function studies (02/22/17-pt taking rosuvastatin without problems) Caused elevated liver function studies  . Sulfa Antibiotics Other (See Comments)    Mouth blisters Welts on mouth   . Codeine Nausea And Vomiting and Other (See Comments)    Nausea  . Prednisone Palpitations and Other (See Comments)    Made her legs "feel weird."     Current Outpatient Medications:  .  acetaminophen (TYLENOL) 500 MG tablet, Take 500 mg by mouth every 6 (six) hours as needed., Disp: , Rfl:  .  albuterol (VENTOLIN HFA) 108 (90 Base) MCG/ACT inhaler, Inhale 2 puffs into the lungs every 4 (four) hours as needed for wheezing or shortness of breath., Disp: 48 g, Rfl: 3 .  amLODipine (NORVASC) 5 MG tablet, Take 1 tablet (5 mg total) by mouth daily., Disp: 90 tablet, Rfl: 3 .  aspirin (ASPIR-81) 81 MG EC tablet, Take 81 mg by mouth daily.  , Disp: , Rfl:  .  clopidogrel (PLAVIX) 75 MG tablet, Take 1 tablet (75 mg total) by mouth daily., Disp: 90 tablet, Rfl: 3 .  cyanocobalamin 1000 MCG tablet, Take 1,000 mcg by mouth daily., Disp: , Rfl:  .  ezetimibe (ZETIA) 10 MG tablet, Take 1 tablet (10 mg total) by mouth daily., Disp: 90 tablet, Rfl: 1 .  fluticasone furoate-vilanterol (BREO ELLIPTA) 100-25 MCG/INH AEPB,  Inhale 1 puff into the lungs daily., Disp: 84 each, Rfl: 3 .  HYDROcodone-acetaminophen (NORCO/VICODIN) 5-325 MG tablet, Take 1 tablet by mouth every 6 (six) hours as needed., Disp: 120 tablet, Rfl: 0 .  insulin glargine (LANTUS) 100 UNIT/ML injection, Inject 18 Units into the skin daily. , Disp: , Rfl:  .  Liraglutide (VICTOZA) 18 MG/3ML SOLN injection, Inject 1.8 mg into the skin daily. , Disp: , Rfl:  .  lisinopril (ZESTRIL) 20 MG tablet, Take 1 tablet (20 mg total) by mouth daily., Disp: 90 tablet, Rfl: 3 .  meclizine (ANTIVERT) 25 MG tablet,  Take by mouth as needed. , Disp: , Rfl:  .  metoprolol tartrate (LOPRESSOR) 100 MG tablet, Take 1 tablet (100 mg total) by mouth 2 (two) times daily., Disp: 180 tablet, Rfl: 3 .  Multiple Vitamins-Minerals (DAILY MULTI) TABS, Take 1 tablet by mouth daily. , Disp: , Rfl:  .  nitroGLYCERIN (NITROSTAT) 0.4 MG SL tablet, Place 1 tablet (0.4 mg total) under the tongue every 5 (five) minutes as needed., Disp: 25 tablet, Rfl: 6 .  Omega-3 Fatty Acids (FISH OIL) 1000 MG CAPS, Take 2 capsules by mouth 2 (two) times daily. Take 4, Disp: , Rfl:  .  omeprazole (PRILOSEC) 40 MG capsule, Take 40 mg by mouth daily as needed., Disp: , Rfl:  .  ondansetron (ZOFRAN ODT) 4 MG disintegrating tablet, Take 1 tablet (4 mg total) by mouth every 8 (eight) hours as needed for nausea or vomiting., Disp: 20 tablet, Rfl: 0 .  RABEprazole (ACIPHEX) 20 MG tablet, Take 20 mg by mouth daily., Disp: , Rfl:  .  rosuvastatin (CRESTOR) 5 MG tablet, Take 1 tablet (5 mg total) by mouth daily., Disp: 90 tablet, Rfl: 3 .  sucralfate (CARAFATE) 1 g tablet, Take 1 g by mouth 2 (two) times daily., Disp: , Rfl:  .  tiotropium (SPIRIVA) 18 MCG inhalation capsule, Place 1 capsule (18 mcg total) into inhaler and inhale daily., Disp: 90 capsule, Rfl: 4  Review of Systems  Constitutional: Negative.   Respiratory: Negative.   Genitourinary: Negative.   Neurological: Negative for tingling, weakness and numbness.  Hematological: Does not bruise/bleed easily.    Social History   Tobacco Use  . Smoking status: Current Every Day Smoker    Packs/day: 0.75    Years: 40.00    Pack years: 30.00    Types: Cigarettes  . Smokeless tobacco: Never Used  Substance Use Topics  . Alcohol use: Yes    Comment: 1/month- wine      Objective:   BP 130/80 (BP Location: Left Arm, Patient Position: Sitting, Cuff Size: Normal)   Pulse 74   Temp (!) 96.9 F (36.1 C) (Temporal)   Wt 174 lb 6.4 oz (79.1 kg)   SpO2 94%   BMI 28.15 kg/m  Vitals:    05/08/19 1433  BP: 130/80  Pulse: 74  Temp: (!) 96.9 F (36.1 C)  TempSrc: Temporal  SpO2: 94%  Weight: 174 lb 6.4 oz (79.1 kg)  Body mass index is 28.15 kg/m.   Physical Exam Constitutional:      Appearance: Normal appearance.  Cardiovascular:     Rate and Rhythm: Normal rate.  Pulmonary:     Effort: Pulmonary effort is normal.  Abdominal:     General: There is no abdominal bruit.     Palpations: There is no pulsatile mass.  Musculoskeletal:       Legs:  Skin:    General: Skin is warm and  dry.  Neurological:     Mental Status: She is alert and oriented to person, place, and time. Mental status is at baseline.      No results found for any visits on 05/08/19.     Assessment & Plan    1. Right hip pain  Suspect this is acute on chronic pain from her lumbar DDD and disc bulging. I don't suspect this is her AAA. Offered small dose of gabapentin if she would like, which patient declines at this time due to care giving burdens. She may benefit from seeing an orthopedic doctor for interventions.  2. AAA (abdominal aortic aneurysm) without rupture ([place])  Stable on 01/2019 ultrasound with Dr. Lucky Cowboy at 3.4 cm from prior image in 2019.   3. DDD (degenerative disc disease), lumbar  The entirety of the information documented in the History of Present Illness, Review of Systems and Physical Exam were personally obtained by me. Portions of this information were initially documented by [facility] and reviewed by me for thoroughness and accuracy.   F/u PRN      Trinna Post, PA-C  [facility]

## 2019-05-08 NOTE — Patient Instructions (Signed)

## 2019-05-09 ENCOUNTER — Telehealth: Payer: Self-pay

## 2019-05-09 DIAGNOSIS — M48061 Spinal stenosis, lumbar region without neurogenic claudication: Secondary | ICD-10-CM

## 2019-05-09 DIAGNOSIS — M25551 Pain in right hip: Secondary | ICD-10-CM

## 2019-05-09 NOTE — Telephone Encounter (Signed)
Copied from [place] [id number]. Topic: General - Other >> May 09, 2019  2:52 PM Rayann Heman wrote: Reason for CRM: pt called and stated that she was in the office on 05/08/19 for back and leg pain and had decided that she would like xrays done. Please advise

## 2019-05-13 NOTE — Telephone Encounter (Signed)
Order placed for right hip and back pain, can go to [facility]

## 2019-05-13 NOTE — Telephone Encounter (Signed)
Patient advised.

## 2019-05-15 ENCOUNTER — Other Ambulatory Visit: Payer: Self-pay

## 2019-05-15 ENCOUNTER — Ambulatory Visit
Admission: RE | Admit: 2019-05-15 | Discharge: 2019-05-15 | Disposition: A | Discharge status: Home / Self Care | Payer: Medicare Other | Attending: Family Medicine | Admitting: Family Medicine

## 2019-05-15 ENCOUNTER — Ambulatory Visit
Admission: RE | Admit: 2019-05-15 | Discharge: 2019-05-15 | Disposition: A | Discharge status: Home / Self Care | Payer: Medicare Other | Source: Ambulatory Visit | Attending: Family Medicine | Admitting: Family Medicine

## 2019-05-15 DIAGNOSIS — M48061 Spinal stenosis, lumbar region without neurogenic claudication: Secondary | ICD-10-CM

## 2019-05-15 DIAGNOSIS — M25551 Pain in right hip: Secondary | ICD-10-CM

## 2019-05-16 ENCOUNTER — Telehealth: Payer: Self-pay

## 2019-05-16 MED ORDER — GABAPENTIN 300 MG PO CAPS: ORAL_CAPSULE | ORAL | 1 refills | Status: DC

## 2019-05-16 NOTE — Telephone Encounter (Signed)
Patient advised. Patient states she has taken Gabapentin before with no relief, but she states she will try it again. [facility]

## 2019-05-16 NOTE — Telephone Encounter (Signed)
OK, is sent prescription for higher dose this time. If not helping after a week or two then call back for referral.

## 2019-05-16 NOTE — Telephone Encounter (Signed)
-----   Message from Birdie Sons, MD sent at 05/16/2019  7:45 AM EDT ----- Hannah Kirby shows arthritis and mild scoliosis of spine, nothing new. Xrays of hip are normal. Adriana discussed trial of gabapentin with patient which would probably be helpful. Alternatively we would refer her to physiatry for injections.

## 2019-05-16 NOTE — Telephone Encounter (Signed)
Patient advised.

## 2019-06-07 ENCOUNTER — Encounter: Payer: Self-pay | Admitting: Family Medicine

## 2019-06-07 DIAGNOSIS — M48061 Spinal stenosis, lumbar region without neurogenic claudication: Secondary | ICD-10-CM

## 2019-06-08 MED ORDER — HYDROCODONE-ACETAMINOPHEN 5-325 MG PO TABS: 1.0000 | ORAL_TABLET | Freq: Four times a day (QID) | ORAL | 0 refills | Status: DC | PRN

## 2019-06-10 ENCOUNTER — Telehealth: Payer: Self-pay

## 2019-06-10 NOTE — Progress Notes (Signed)
Established patient visit    Patient: Hannah Kirby   DOB: December 08, 1945   74 y.o. Female  MRN: [id number] Visit Date: 06/11/2019  Today's healthcare provider: Trinna Post, PA-C   Chief Complaint  Patient presents with  . Hand Pain  I,Deavin Forst M Dempsy Damiano,acting as a scribe for Trinna Post, PA-C.,have documented all relevant documentation on the behalf of Trinna Post, PA-C,as directed by  Trinna Post, PA-C while in the presence of Trinna Post, PA-C.  Subjective    Hand Pain  The incident occurred more than 1 week ago. There was no injury mechanism. The pain is present in the right fingers and right hand. The quality of the pain is described as burning. The pain does not radiate. The pain is at a severity of 6/10. The pain is moderate. The pain has been fluctuating since the incident. Associated symptoms include numbness (fingers per pt) and tingling (right thumb per pt). Pertinent negatives include no chest pain or muscle weakness. The symptoms are aggravated by lifting and movement. She has tried acetaminophen for the symptoms. The treatment provided mild relief.  Patient with hx of well controlled diabetes. Decides numbness and burning in both hands and worsening with driving, typing and morning when wakes up. Patient reports dropping items more.        Medications: Outpatient Medications Prior to Visit  Medication Sig  . acetaminophen (TYLENOL) 500 MG tablet Take 500 mg by mouth every 6 (six) hours as needed.  Marland Kitchen albuterol (VENTOLIN HFA) 108 (90 Base) MCG/ACT inhaler Inhale 2 puffs into the lungs every 4 (four) hours as needed for wheezing or shortness of breath.  Marland Kitchen amLODipine (NORVASC) 5 MG tablet Take 1 tablet (5 mg total) by mouth daily.  Marland Kitchen aspirin (ASPIR-81) 81 MG EC tablet Take 81 mg by mouth daily.    . calcium carbonate (OSCAL) 1500 (600 Ca) MG TABS tablet Take by mouth.  . clopidogrel (PLAVIX) 75 MG tablet Take 1 tablet (75 mg total) by mouth daily.    . cyanocobalamin 1000 MCG tablet Take 1,000 mcg by mouth daily.  . Cysteamine Bitartrate (PROCYSBI) 300 MG PACK Use 1 each 3 ( three ) times daily as directed. Dispense Accu-chek Fast clix E11.21  . ezetimibe (ZETIA) 10 MG tablet Take 1 tablet (10 mg total) by mouth daily.  . fluticasone furoate-vilanterol (BREO ELLIPTA) 100-25 MCG/INH AEPB Inhale 1 puff into the lungs daily.  Marland Kitchen gabapentin (NEURONTIN) 300 MG capsule Take one tablet at bedtime for 4 days, then twice a day for 4 days as needed  . glucose blood (ACCU-CHEK AVIVA PLUS) test strip Use 1 each (1 strip total) 3 (three) times daily Use as instructed.  Marland Kitchen HYDROcodone-acetaminophen (NORCO/VICODIN) 5-325 MG tablet Take 1 tablet by mouth every 6 (six) hours as needed.  . insulin glargine (LANTUS) 100 UNIT/ML injection Inject 18 Units into the skin daily.   . Liraglutide (VICTOZA) 18 MG/3ML SOLN injection Inject 1.8 mg into the skin daily.   Marland Kitchen lisinopril (ZESTRIL) 20 MG tablet Take 1 tablet (20 mg total) by mouth daily.  . meclizine (ANTIVERT) 25 MG tablet Take by mouth as needed.   . metoprolol tartrate (LOPRESSOR) 100 MG tablet Take 1 tablet (100 mg total) by mouth 2 (two) times daily.  . Multiple Vitamins-Iron TABS Take by mouth.  . Multiple Vitamins-Minerals (DAILY MULTI) TABS Take 1 tablet by mouth daily.   . nitroGLYCERIN (NITROSTAT) 0.4 MG SL tablet Place 1 tablet (0.4 mg total)  under the tongue every 5 (five) minutes as needed.  . Omega-3 Fatty Acids (FISH OIL) 1000 MG CAPS Take 2 capsules by mouth 2 (two) times daily. Take 4  . omeprazole (PRILOSEC) 40 MG capsule Take 40 mg by mouth daily as needed.  . ondansetron (ZOFRAN ODT) 4 MG disintegrating tablet Take 1 tablet (4 mg total) by mouth every 8 (eight) hours as needed for nausea or vomiting.  . RABEprazole (ACIPHEX) 20 MG tablet Take 20 mg by mouth daily.  . rosuvastatin (CRESTOR) 5 MG tablet Take 1 tablet (5 mg total) by mouth daily.  . sucralfate (CARAFATE) 1 g tablet Take 1 g by  mouth 2 (two) times daily.  Marland Kitchen tiotropium (SPIRIVA) 18 MCG inhalation capsule Place 1 capsule (18 mcg total) into inhaler and inhale daily.   No facility-administered medications prior to visit.    Review of Systems  Cardiovascular: Negative for chest pain.  Neurological: Positive for tingling (right thumb per pt) and numbness (fingers per pt).       Objective    BP 138/86 (BP Location: Right Arm, Patient Position: Sitting, Cuff Size: Normal)   Pulse 72   Temp (!) 96.6 F (35.9 C) (Temporal)   Wt 176 lb 9.6 oz (80.1 kg)   SpO2 96%   BMI 28.50 kg/m    Physical Exam Constitutional:      Appearance: Normal appearance.  Musculoskeletal:     Right hand: Normal strength. Normal strength of finger abduction and thumb/finger opposition.     Left hand: Decreased strength of finger abduction and thumb/finger opposition.     Comments: Thenar atrophy of right hand. Phalens positive on right hand. Subluxation of right ip thumb joint with flexion.  Skin:    General: Skin is warm and dry.  Neurological:     General: No focal deficit present.     Mental Status: She is alert and oriented to person, place, and time.  Psychiatric:        Mood and Affect: Mood normal.        Behavior: Behavior normal.       No results found for any visits on 06/11/19.   Assessment & Plan    1. Carpal tunnel syndrome, right Patient was advised to get a cockup wrist splint for both wrists and wear them nightly for 6 weeks. Discussed a referral to the orthopedics and possible x-ray   Return if symptoms worsen or fail to improve.      ITrinna Post, PA-C, have reviewed all documentation for this visit. The documentation on 06/11/19 for the exam, diagnosis, procedures, and orders are all accurate and complete.    Paulene Floor  [facility] [phone] (phone) [phone] (fax)  [place]

## 2019-06-10 NOTE — Telephone Encounter (Signed)
Copied from [place] [id number]. Topic: Appointment Scheduling - Scheduling Inquiry for Clinic >> Jun 10, 2019  9:11 AM Percell Belt A wrote: Reason for CRM: pt would like to see If she can be worked in this week to see Dr Caryn Section.  He had a same day wed but pt can not do wed morning . She wants to see Dr Caryn Section only about some thumb pain she is having

## 2019-06-10 NOTE — Telephone Encounter (Signed)
LMTCB. Dr. Caryn Section does not have any openings this week. Patient can schedule with another provider if she would like. There are openings with other providers. Okay for [facility] to schedule.

## 2019-06-11 ENCOUNTER — Other Ambulatory Visit: Payer: Self-pay

## 2019-06-11 ENCOUNTER — Ambulatory Visit (INDEPENDENT_AMBULATORY_CARE_PROVIDER_SITE_OTHER): Payer: Medicare Other | Admitting: Physician Assistant

## 2019-06-11 ENCOUNTER — Encounter: Payer: Self-pay | Admitting: Physician Assistant

## 2019-06-11 ENCOUNTER — Telehealth: Payer: Self-pay

## 2019-06-11 VITALS — BP 138/86 | HR 72 | Temp 96.6°F | Wt 176.6 lb

## 2019-06-11 DIAGNOSIS — J449 Chronic obstructive pulmonary disease, unspecified: Secondary | ICD-10-CM

## 2019-06-11 DIAGNOSIS — G5601 Carpal tunnel syndrome, right upper limb: Secondary | ICD-10-CM | POA: Diagnosis not present

## 2019-06-11 MED ORDER — TIOTROPIUM BROMIDE MONOHYDRATE 18 MCG IN CAPS: 18.0000 ug | ORAL_CAPSULE | Freq: Every day | RESPIRATORY_TRACT | 4 refills | Status: DC

## 2019-06-11 NOTE — Patient Instructions (Signed)
Cock up wrist splint 

## 2019-06-11 NOTE — Telephone Encounter (Signed)
Patient was in the office today and requested a refill on her Spiriva inhaler. Inhaler was send into the pharmacy.

## 2019-06-16 ENCOUNTER — Encounter: Payer: Self-pay | Admitting: Physician Assistant

## 2019-06-16 DIAGNOSIS — G5600 Carpal tunnel syndrome, unspecified upper limb: Secondary | ICD-10-CM

## 2019-07-01 ENCOUNTER — Ambulatory Visit: Payer: Medicare Other | Admitting: Cardiovascular Disease

## 2019-07-09 ENCOUNTER — Encounter: Payer: Self-pay | Admitting: Family Medicine

## 2019-07-09 DIAGNOSIS — M48061 Spinal stenosis, lumbar region without neurogenic claudication: Secondary | ICD-10-CM

## 2019-07-09 MED ORDER — HYDROCODONE-ACETAMINOPHEN 5-325 MG PO TABS: 1.0000 | ORAL_TABLET | Freq: Four times a day (QID) | ORAL | 0 refills | Status: DC | PRN

## 2019-07-12 LAB — TSH: TSH: 3.23 (ref 0.41–5.90)

## 2019-07-12 LAB — LIPID PANEL
Cholesterol: 189 (ref 0–200)
HDL: 51 (ref 35–70)
LDL Cholesterol: 76
Triglycerides: 312 — AB (ref 40–160)

## 2019-08-01 ENCOUNTER — Observation Stay (HOSPITAL_BASED_OUTPATIENT_CLINIC_OR_DEPARTMENT_OTHER)
Admit: 2019-08-01 | Discharge: 2019-08-01 | Disposition: A | Discharge status: Home / Self Care | Payer: Medicare Other | Attending: Internal Medicine | Admitting: Internal Medicine

## 2019-08-01 ENCOUNTER — Encounter: Payer: Self-pay | Admitting: Emergency Medicine

## 2019-08-01 ENCOUNTER — Other Ambulatory Visit: Payer: Self-pay

## 2019-08-01 ENCOUNTER — Observation Stay
Admission: AD | Admit: 2019-08-01 | Discharge: 2019-08-01 | Discharge status: Against Medical Advice | Payer: Medicare Other | Attending: Internal Medicine | Admitting: Internal Medicine

## 2019-08-01 ENCOUNTER — Observation Stay: Payer: Medicare Other

## 2019-08-01 ENCOUNTER — Emergency Department: Payer: Medicare Other

## 2019-08-01 DIAGNOSIS — I10 Essential (primary) hypertension: Secondary | ICD-10-CM | POA: Diagnosis not present

## 2019-08-01 DIAGNOSIS — Z79899 Other long term (current) drug therapy: Secondary | ICD-10-CM | POA: Diagnosis not present

## 2019-08-01 DIAGNOSIS — Z7951 Long term (current) use of inhaled steroids: Secondary | ICD-10-CM | POA: Diagnosis not present

## 2019-08-01 DIAGNOSIS — R269 Unspecified abnormalities of gait and mobility: Secondary | ICD-10-CM | POA: Insufficient documentation

## 2019-08-01 DIAGNOSIS — Z20822 Contact with and (suspected) exposure to covid-19: Secondary | ICD-10-CM | POA: Diagnosis not present

## 2019-08-01 DIAGNOSIS — I69354 Hemiplegia and hemiparesis following cerebral infarction affecting left non-dominant side: Secondary | ICD-10-CM | POA: Insufficient documentation

## 2019-08-01 DIAGNOSIS — J449 Chronic obstructive pulmonary disease, unspecified: Secondary | ICD-10-CM | POA: Insufficient documentation

## 2019-08-01 DIAGNOSIS — I6782 Cerebral ischemia: Secondary | ICD-10-CM | POA: Insufficient documentation

## 2019-08-01 DIAGNOSIS — Z888 Allergy status to other drugs, medicaments and biological substances status: Secondary | ICD-10-CM | POA: Diagnosis not present

## 2019-08-01 DIAGNOSIS — I340 Nonrheumatic mitral (valve) insufficiency: Secondary | ICD-10-CM

## 2019-08-01 DIAGNOSIS — I2510 Atherosclerotic heart disease of native coronary artery without angina pectoris: Secondary | ICD-10-CM | POA: Diagnosis not present

## 2019-08-01 DIAGNOSIS — I639 Cerebral infarction, unspecified: Secondary | ICD-10-CM

## 2019-08-01 DIAGNOSIS — Z882 Allergy status to sulfonamides status: Secondary | ICD-10-CM | POA: Insufficient documentation

## 2019-08-01 DIAGNOSIS — E669 Obesity, unspecified: Secondary | ICD-10-CM | POA: Insufficient documentation

## 2019-08-01 DIAGNOSIS — E0821 Diabetes mellitus due to underlying condition with diabetic nephropathy: Secondary | ICD-10-CM

## 2019-08-01 DIAGNOSIS — G459 Transient cerebral ischemic attack, unspecified: Secondary | ICD-10-CM | POA: Diagnosis present

## 2019-08-01 DIAGNOSIS — M1990 Unspecified osteoarthritis, unspecified site: Secondary | ICD-10-CM | POA: Insufficient documentation

## 2019-08-01 DIAGNOSIS — Z885 Allergy status to narcotic agent status: Secondary | ICD-10-CM | POA: Insufficient documentation

## 2019-08-01 DIAGNOSIS — R531 Weakness: Secondary | ICD-10-CM | POA: Diagnosis not present

## 2019-08-01 DIAGNOSIS — Z955 Presence of coronary angioplasty implant and graft: Secondary | ICD-10-CM | POA: Diagnosis not present

## 2019-08-01 DIAGNOSIS — E1121 Type 2 diabetes mellitus with diabetic nephropathy: Secondary | ICD-10-CM | POA: Diagnosis present

## 2019-08-01 DIAGNOSIS — I779 Disorder of arteries and arterioles, unspecified: Secondary | ICD-10-CM

## 2019-08-01 DIAGNOSIS — N1832 Chronic kidney disease, stage 3b: Secondary | ICD-10-CM | POA: Insufficient documentation

## 2019-08-01 DIAGNOSIS — Z7982 Long term (current) use of aspirin: Secondary | ICD-10-CM | POA: Diagnosis not present

## 2019-08-01 DIAGNOSIS — Z794 Long term (current) use of insulin: Secondary | ICD-10-CM | POA: Insufficient documentation

## 2019-08-01 DIAGNOSIS — E785 Hyperlipidemia, unspecified: Secondary | ICD-10-CM | POA: Diagnosis not present

## 2019-08-01 DIAGNOSIS — Z7902 Long term (current) use of antithrombotics/antiplatelets: Secondary | ICD-10-CM | POA: Diagnosis not present

## 2019-08-01 DIAGNOSIS — E1122 Type 2 diabetes mellitus with diabetic chronic kidney disease: Secondary | ICD-10-CM | POA: Diagnosis not present

## 2019-08-01 DIAGNOSIS — I129 Hypertensive chronic kidney disease with stage 1 through stage 4 chronic kidney disease, or unspecified chronic kidney disease: Secondary | ICD-10-CM | POA: Diagnosis not present

## 2019-08-01 DIAGNOSIS — F17210 Nicotine dependence, cigarettes, uncomplicated: Secondary | ICD-10-CM | POA: Insufficient documentation

## 2019-08-01 DIAGNOSIS — E119 Type 2 diabetes mellitus without complications: Secondary | ICD-10-CM

## 2019-08-01 LAB — CBC
HCT: 49.7 % — ABNORMAL HIGH (ref 36.0–46.0)
Hemoglobin: 16.6 g/dL — ABNORMAL HIGH (ref 12.0–15.0)
MCH: 32.4 pg (ref 26.0–34.0)
MCHC: 33.4 g/dL (ref 30.0–36.0)
MCV: 96.9 fL (ref 80.0–100.0)
Platelets: 214 10*3/uL (ref 150–400)
RBC: 5.13 MIL/uL — ABNORMAL HIGH (ref 3.87–5.11)
RDW: 14 % (ref 11.5–15.5)
WBC: 8.8 10*3/uL (ref 4.0–10.5)
nRBC: 0 % (ref 0.0–0.2)

## 2019-08-01 LAB — COMPREHENSIVE METABOLIC PANEL
ALT: 21 U/L (ref 0–44)
AST: 19 U/L (ref 15–41)
Albumin: 3.8 g/dL (ref 3.5–5.0)
Alkaline Phosphatase: 100 U/L (ref 38–126)
Anion gap: 9 (ref 5–15)
BUN: 27 mg/dL — ABNORMAL HIGH (ref 8–23)
CO2: 28 mmol/L (ref 22–32)
Calcium: 9.7 mg/dL (ref 8.9–10.3)
Chloride: 104 mmol/L (ref 98–111)
Creatinine, Ser: 1.68 mg/dL — ABNORMAL HIGH (ref 0.44–1.00)
GFR calc Af Amer: 35 mL/min — ABNORMAL LOW (ref >60)
GFR calc non Af Amer: 30 mL/min — ABNORMAL LOW (ref >60)
Glucose, Bld: 187 mg/dL — ABNORMAL HIGH (ref 70–99)
Potassium: 3.5 mmol/L (ref 3.5–5.1)
Sodium: 141 mmol/L (ref 135–145)
Total Bilirubin: 0.6 mg/dL (ref 0.3–1.2)
Total Protein: 7.4 g/dL (ref 6.5–8.1)

## 2019-08-01 LAB — DIFFERENTIAL
Abs Immature Granulocytes: 0.04 10*3/uL (ref 0.00–0.07)
Basophils Absolute: 0 10*3/uL (ref 0.0–0.1)
Basophils Relative: 1 %
Eosinophils Absolute: 0.3 10*3/uL (ref 0.0–0.5)
Eosinophils Relative: 4 %
Immature Granulocytes: 1 %
Lymphocytes Relative: 29 %
Lymphs Abs: 2.6 10*3/uL (ref 0.7–4.0)
Monocytes Absolute: 0.7 10*3/uL (ref 0.1–1.0)
Monocytes Relative: 7 %
Neutro Abs: 5.2 10*3/uL (ref 1.7–7.7)
Neutrophils Relative %: 58 %

## 2019-08-01 LAB — PROTIME-INR
INR: 1 (ref 0.8–1.2)
Prothrombin Time: 12.5 seconds (ref 11.4–15.2)

## 2019-08-01 LAB — HEMOGLOBIN A1C
Hgb A1c MFr Bld: 8 % — ABNORMAL HIGH (ref 4.8–5.6)
Mean Plasma Glucose: 182.9 mg/dL

## 2019-08-01 LAB — GLUCOSE, CAPILLARY: Glucose-Capillary: 192 mg/dL — ABNORMAL HIGH (ref 70–99)

## 2019-08-01 LAB — ECHOCARDIOGRAM COMPLETE

## 2019-08-01 LAB — APTT: aPTT: 30 seconds (ref 24–36)

## 2019-08-01 LAB — SARS CORONAVIRUS 2 BY RT PCR (HOSPITAL ORDER, PERFORMED IN ~~LOC~~ HOSPITAL LAB): SARS Coronavirus 2: NEGATIVE

## 2019-08-01 MED ORDER — ACETAMINOPHEN 325 MG PO TABS: 650.0000 mg | ORAL_TABLET | Freq: Four times a day (QID) | ORAL | Status: DC | PRN

## 2019-08-01 MED ORDER — FLUTICASONE FUROATE-VILANTEROL 100-25 MCG/INH IN AEPB: 1.0000 | INHALATION_SPRAY | Freq: Every day | RESPIRATORY_TRACT | Status: DC

## 2019-08-01 MED ORDER — DAILY MULTI PO TABS: 1.0000 | ORAL_TABLET | Freq: Every day | ORAL | Status: DC

## 2019-08-01 MED ORDER — STROKE: EARLY STAGES OF RECOVERY BOOK: Freq: Once | Status: DC

## 2019-08-01 MED ORDER — EZETIMIBE 10 MG PO TABS: 10.0000 mg | ORAL_TABLET | Freq: Every day | ORAL | Status: DC

## 2019-08-01 MED ORDER — INSULIN ASPART 100 UNIT/ML ~~LOC~~ SOLN: 0.0000 [IU] | Freq: Three times a day (TID) | SUBCUTANEOUS | Status: DC

## 2019-08-01 MED ORDER — INSULIN ASPART 100 UNIT/ML ~~LOC~~ SOLN: 0.0000 [IU] | SUBCUTANEOUS | Status: DC

## 2019-08-01 MED ORDER — VITAMIN B-12 1000 MCG PO TABS: 1000.0000 ug | ORAL_TABLET | Freq: Every day | ORAL | Status: DC

## 2019-08-01 MED ORDER — FISH OIL 1000 MG PO CAPS: 2.0000 | ORAL_CAPSULE | Freq: Two times a day (BID) | ORAL | Status: DC

## 2019-08-01 MED ORDER — HYDROCODONE-ACETAMINOPHEN 5-325 MG PO TABS: 1.0000 | ORAL_TABLET | Freq: Four times a day (QID) | ORAL | Status: DC | PRN

## 2019-08-01 MED ORDER — PANTOPRAZOLE SODIUM 40 MG PO TBEC: 40.0000 mg | DELAYED_RELEASE_TABLET | Freq: Every day | ORAL | Status: DC

## 2019-08-01 MED ORDER — SODIUM CHLORIDE 0.9 % IV SOLN: INTRAVENOUS | Status: DC

## 2019-08-01 MED ORDER — SODIUM CHLORIDE 0.9% FLUSH: 3.0000 mL | Freq: Once | INTRAVENOUS | Status: DC

## 2019-08-01 MED ORDER — CLOPIDOGREL BISULFATE 75 MG PO TABS: 75.0000 mg | ORAL_TABLET | Freq: Every day | ORAL | Status: DC

## 2019-08-01 MED ORDER — SENNOSIDES-DOCUSATE SODIUM 8.6-50 MG PO TABS: 1.0000 | ORAL_TABLET | Freq: Every evening | ORAL | Status: DC | PRN

## 2019-08-01 MED ORDER — SUCRALFATE 1 G PO TABS: 1.0000 g | ORAL_TABLET | Freq: Two times a day (BID) | ORAL | Status: DC

## 2019-08-01 MED ORDER — CLOPIDOGREL BISULFATE 75 MG PO TABS
75.0000 mg | ORAL_TABLET | Freq: Once | ORAL | Status: AC
  Administered 2019-08-01: 75 mg via ORAL
  Filled 2019-08-01: qty 1

## 2019-08-01 MED ORDER — ASPIRIN 300 MG RE SUPP: 300.0000 mg | Freq: Every day | RECTAL | Status: DC

## 2019-08-01 MED ORDER — GABAPENTIN 300 MG PO CAPS: 300.0000 mg | ORAL_CAPSULE | Freq: Four times a day (QID) | ORAL | Status: DC

## 2019-08-01 MED ORDER — MECLIZINE HCL 25 MG PO TABS: 25.0000 mg | ORAL_TABLET | Freq: Three times a day (TID) | ORAL | Status: DC | PRN

## 2019-08-01 MED ORDER — TIOTROPIUM BROMIDE MONOHYDRATE 18 MCG IN CAPS: 18.0000 ug | ORAL_CAPSULE | Freq: Every day | RESPIRATORY_TRACT | Status: DC

## 2019-08-01 MED ORDER — NITROGLYCERIN 0.4 MG SL SUBL: 0.4000 mg | SUBLINGUAL_TABLET | SUBLINGUAL | Status: DC | PRN

## 2019-08-01 MED ORDER — ACETAMINOPHEN 500 MG PO TABS
ORAL_TABLET | ORAL | Status: AC
  Administered 2019-08-01: 1000 mg via ORAL
  Filled 2019-08-01: qty 2

## 2019-08-01 MED ORDER — ACETAMINOPHEN 500 MG PO TABS: 1000.0000 mg | ORAL_TABLET | Freq: Once | ORAL | Status: AC

## 2019-08-01 MED ORDER — ASPIRIN 81 MG PO CHEW
324.0000 mg | CHEWABLE_TABLET | Freq: Once | ORAL | Status: AC
  Administered 2019-08-01: 324 mg via ORAL
  Filled 2019-08-01: qty 4

## 2019-08-01 MED ORDER — ASPIRIN EC 325 MG PO TBEC: 325.0000 mg | DELAYED_RELEASE_TABLET | Freq: Every day | ORAL | Status: DC

## 2019-08-01 NOTE — Progress Notes (Signed)
CODE STROKE- PHARMACY COMMUNICATION   Time CODE STROKE called/page received: 1207  Time response to CODE STROKE was made (in person or via phone): 1215  Time Stroke Kit retrieved from [place] (only if needed): n/a ; no tPA per neurologist  Name of Provider/Nurse contacted: Dr. Danae Chen  Pharmacy Resident 08/01/2019  12:23 PM

## 2019-08-01 NOTE — ED Notes (Signed)
Pt stating she wants to go home. MD Randol Kern notified and at bedside to discuss risks and benefits with patient. Pt still wants to go home. AMA form signed and witnessed. Pt unsteady gait noted, able to get from bed to wheelchair w minimal assistance. Pt states she has a headache, given tylenol before discharge.

## 2019-08-01 NOTE — ED Notes (Signed)
Pt given meal tray and bedside commode placed in room

## 2019-08-01 NOTE — ED Notes (Signed)
Message sent to pharmacy to verify medications 

## 2019-08-01 NOTE — Progress Notes (Signed)
Ch visited with Pt in response to Code Stroke. Pt was still in the hallway being asked questions by the neurologist. After Pt was moved to the room, Pt got very tearful and started talking about how there would be no one to take care of her husband if she is gone. Ch provided support for a while.Ch asked Pt if family was around, and she let Ch know that her sister was waiting in the car. Ch went to find her sister, to bring her to the room. Pt's sister was comforting Pt and asking her to calm down, and think about herself now. Phone calls came from family members. Pt shared some very heavy things about young grand-daughter who is going through so much. Pt requested prayer, and Ch prayed with both Pt, and her sister. Ch let them know about chaplain availability, visiting hours, protocols for family before leaving.

## 2019-08-01 NOTE — ED Provider Notes (Signed)
[facility] Emergency Department Provider Note  ____________________________________________  Time seen: Approximately 1:01 PM  I have reviewed the triage vital signs and the nursing notes.   HISTORY  Chief Complaint Numbness and Weakness    HPI Hannah Kirby is a 74 y.o. female with a history of CKD, COPD, diabetes, hypertension, prior stroke with residual mild left-sided weakness  who reports being in her usual state of health when waking up this morning, and then at 7:30 AM had acute onset of numbness in the left arm and weakness in the left leg which are new compared to previous baseline.  No recent falls or trauma.  No fever chills chest pain shortness of breath or other acute symptoms.  Symptoms been constant since onset without aggravating or alleviating factors.     Past Medical History:  Diagnosis Date  . Anemia   . Arthritis   . CAD (coronary artery disease)    a. cath 02/2006: BMS x 2 to RCA, cath o/w without significant coronary disease; b. nuclear stress test 07/2014: no signs of ischemia, no ekg changes concerning for ischemia, low risk study/normal study  . Chronic bronchitis ([place])    secondary to cigarette smoking  . Chronic kidney disease (CKD), stage III (moderate)   . COPD (chronic obstructive pulmonary disease) ([place])   . Diabetes mellitus   . FHx: allergies   . GERD (gastroesophageal reflux disease)   . Goiter   . Granulomatous disease ([place])   . Hernia   . Hyperlipidemia   . Hypertension   . Kidney stone on left side 2013  . Microalbuminuria   . Obesity   . Smokers' cough ([place])   . Spinal stenosis   . Stroke ([place]) 10/29/2016   mild left side weakness     Patient Active Problem List   Diagnosis Date Noted  . Benign hypertensive kidney disease with chronic kidney disease 04/25/2019  . Type 2 diabetes mellitus with diabetic chronic kidney disease ([place]) 04/25/2019  . Secondary hyperparathyroidism of renal origin ([place])  10/24/2018  . Chronic, continuous use of opioids 03/26/2018  . History of adenomatous polyp of colon 01/22/2018  . Ulceration of colon determined by endoscopy 01/22/2018  . Diverticulosis of colon 01/22/2018  . Stroke ([place]) 11/08/2016  . Weakness of left upper extremity 10/30/2016  . AAA (abdominal aortic aneurysm) without rupture ([place]) 08/12/2016  . 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 Stage 3b chronic kidney disease 07/27/2015  . Chest pain at rest 05/06/2015  . 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[place]) 10/28/2014  . Solitary pulmonary nodule on lung CT 08/20/2014  . Allergic rhinitis 07/28/2014  . CAFL (chronic airflow limitation) ([place]) 07/28/2014  . Abnormal liver enzymes 07/28/2014  . Acid reflux 07/28/2014  . Obesity   . Granuloma annulare   . Back pain 11/12/2013  . Scoliosis 10/30/2013  . Lumbar radiculopathy 10/30/2013  . Lumbar canal stenosis 10/30/2013  . Calcium blood increased 10/22/2013  . Proteinuria 10/22/2013  . Tobacco use disorder 03/21/2011  . Obese 03/21/2011  . Edema 08/18/2010  . 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 Coronary atherosclerosis 03/25/2009  . Hypertension associated with diabetes ([place]) 03/25/2009     Past Surgical History:  Procedure Laterality Date  . ABDOMINAL HYSTERECTOMY    . BREAST SURGERY    . CATARACT EXTRACTION W/PHACO Right 03/01/2017   Procedure: CATARACT EXTRACTION PHACO AND INTRAOCULAR LENS PLACEMENT ([place]) RIGHT DIABETIC;  Surgeon: Leandrew Koyanagi, MD;  Location: [place];  Service: Ophthalmology;  Laterality: Right;  .  CATARACT EXTRACTION W/PHACO Left 03/22/2017   Procedure: CATARACT EXTRACTION PHACO AND INTRAOCULAR LENS PLACEMENT ([place]) LEFT DIABETIC;  Surgeon: Leandrew Koyanagi, MD;  Location: [place];  Service: Ophthalmology;  Laterality: Left;  Diabetic - insulin and oral meds  . COLONOSCOPY WITH PROPOFOL N/A 09/23/2014   Procedure: COLONOSCOPY WITH PROPOFOL;  Surgeon:  Lollie Sails, MD;  Location: [facility] ENDOSCOPY;  Service: Endoscopy;  Laterality: N/A;  . COLONOSCOPY WITH PROPOFOL N/A 01/11/2018   Procedure: COLONOSCOPY WITH PROPOFOL;  Surgeon: Lollie Sails, MD;  Location: [facility] ENDOSCOPY;  Service: Endoscopy;  Laterality: N/A;  . COLONOSCOPY WITH PROPOFOL N/A 04/24/2018   Procedure: COLONOSCOPY WITH PROPOFOL;  Surgeon: Lollie Sails, MD;  Location: [facility] ENDOSCOPY;  Service: Endoscopy;  Laterality: N/A;  . CORONARY ANGIOPLASTY WITH STENT PLACEMENT  2008  . ESOPHAGOGASTRODUODENOSCOPY (EGD) WITH PROPOFOL N/A 12/30/2014   Procedure: ESOPHAGOGASTRODUODENOSCOPY (EGD) WITH PROPOFOL;  Surgeon: Lollie Sails, MD;  Location: [facility] ENDOSCOPY;  Service: Endoscopy;  Laterality: N/A;  . ESOPHAGOGASTRODUODENOSCOPY (EGD) WITH PROPOFOL N/A 07/19/2016   Procedure: ESOPHAGOGASTRODUODENOSCOPY (EGD) WITH PROPOFOL;  Surgeon: Lollie Sails, MD;  Location: [facility] ENDOSCOPY;  Service: Endoscopy;  Laterality: N/A;  . ESOPHAGOGASTRODUODENOSCOPY (EGD) WITH PROPOFOL N/A 04/24/2018   Procedure: ESOPHAGOGASTRODUODENOSCOPY (EGD) WITH PROPOFOL;  Surgeon: Lollie Sails, MD;  Location: [facility] ENDOSCOPY;  Service: Endoscopy;  Laterality: N/A;  . hysterectomy (other)       Prior to Admission medications   Medication Sig Start Date End Date Taking? Authorizing Provider  acetaminophen (TYLENOL) 500 MG tablet Take 500 mg by mouth every 6 (six) hours as needed.    [provider]  albuterol (VENTOLIN HFA) 108 (90 Base) MCG/ACT inhaler Inhale 2 puffs into the lungs every 4 (four) hours as needed for wheezing or shortness of breath. 09/21/17   Birdie Sons, MD  amLODipine (NORVASC) 5 MG tablet Take 1 tablet (5 mg total) by mouth daily. 12/31/18   Minna Merritts, MD  aspirin (ASPIR-81) 81 MG EC tablet Take 81 mg by mouth daily.      [provider]  calcium carbonate (OSCAL) 1500 (600 Ca) MG TABS tablet Take by mouth.    [provider]  clopidogrel  (PLAVIX) 75 MG tablet Take 1 tablet (75 mg total) by mouth daily. 12/31/18   Minna Merritts, MD  cyanocobalamin 1000 MCG tablet Take 1,000 mcg by mouth daily.    [provider]  Cysteamine Bitartrate (PROCYSBI) 300 MG PACK Use 1 each 3 ( three ) times daily as directed. Dispense Accu-chek Fast clix E11.21 10/01/18   [provider]  ezetimibe (ZETIA) 10 MG tablet Take 1 tablet (10 mg total) by mouth daily. 12/31/18   Minna Merritts, MD  fluticasone furoate-vilanterol (BREO ELLIPTA) 100-25 MCG/INH AEPB Inhale 1 puff into the lungs daily. 09/07/18   Birdie Sons, MD  gabapentin (NEURONTIN) 300 MG capsule Take one tablet at bedtime for 4 days, then twice a day for 4 days as needed 05/16/19   Birdie Sons, MD  glucose blood (ACCU-CHEK AVIVA PLUS) test strip Use 1 each (1 strip total) 3 (three) times daily Use as instructed. 10/01/18 10/01/19  [provider]  HYDROcodone-acetaminophen (NORCO/VICODIN) 5-325 MG tablet Take 1 tablet by mouth every 6 (six) hours as needed. 07/09/19   Birdie Sons, MD  insulin glargine (LANTUS) 100 UNIT/ML injection Inject 18 Units into the skin daily.     [provider]  Liraglutide (VICTOZA) 18 MG/3ML SOLN injection Inject 1.8 mg  into the skin daily.     [provider]  lisinopril (ZESTRIL) 20 MG tablet Take 1 tablet (20 mg total) by mouth daily. 12/31/18   Minna Merritts, MD  meclizine (ANTIVERT) 25 MG tablet Take by mouth as needed.  07/06/11   [provider]  metoprolol tartrate (LOPRESSOR) 100 MG tablet Take 1 tablet (100 mg total) by mouth 2 (two) times daily. 12/31/18   Minna Merritts, MD  Multiple Vitamins-Iron TABS Take by mouth.    [provider]  Multiple Vitamins-Minerals (DAILY MULTI) TABS Take 1 tablet by mouth daily.     [provider]  nitroGLYCERIN (NITROSTAT) 0.4 MG SL tablet Place 1 tablet (0.4 mg total) under the tongue every 5 (five) minutes as needed. 11/22/17    Minna Merritts, MD  Omega-3 Fatty Acids (FISH OIL) 1000 MG CAPS Take 2 capsules by mouth 2 (two) times daily. Take 4    [provider]  omeprazole (PRILOSEC) 40 MG capsule Take 40 mg by mouth daily as needed.    [provider]  ondansetron (ZOFRAN ODT) 4 MG disintegrating tablet Take 1 tablet (4 mg total) by mouth every 8 (eight) hours as needed for nausea or vomiting. 08/06/16   Alfred Levins, [place], MD  RABEprazole (ACIPHEX) 20 MG tablet Take 20 mg by mouth daily.    [provider]  rosuvastatin (CRESTOR) 5 MG tablet Take 1 tablet (5 mg total) by mouth daily. 12/31/18   Minna Merritts, MD  sucralfate (CARAFATE) 1 g tablet Take 1 g by mouth 2 (two) times daily.    [provider]  tiotropium (SPIRIVA) 18 MCG inhalation capsule Place 1 capsule (18 mcg total) into inhaler and inhale daily. 06/11/19   Birdie Sons, MD     Allergies Spironolactone, Sulfonamide derivatives, Other, Statins, Sulfa antibiotics, Codeine, and Prednisone   Family History  Problem Relation Age of Onset  . Heart failure Mother   . Stroke Mother   . Diabetes Mother   . Congestive Heart Failure Mother   . Lung cancer Father   . Breast cancer Maternal Aunt     Social History Social History   Tobacco Use  . Smoking status: Current Every Day Smoker    Packs/day: 0.75    Years: 40.00    Pack years: 30.00    Types: Cigarettes  . Smokeless tobacco: Never Used  Vaping Use  . Vaping Use: Former  Substance Use Topics  . Alcohol use: Yes    Comment: 1/month- wine  . Drug use: No    Review of Systems  Constitutional:   No fever or chills.  ENT:   No sore throat. No rhinorrhea. Cardiovascular:   No chest pain or syncope. Respiratory:   No dyspnea or cough. Gastrointestinal:   Negative for abdominal pain, vomiting and diarrhea.  Musculoskeletal:   Negative for focal pain or swelling All other systems reviewed and are negative except as documented above in ROS and  HPI.  ____________________________________________   PHYSICAL EXAM:  VITAL SIGNS: ED Triage Vitals [08/01/19 1202]  Enc Vitals Group     BP (!) 184/87     Pulse Rate 72     Resp 16     Temp 98.6 F (37 C)     Temp Source Oral     SpO2 100 %     Weight      Height      Head Circumference      Peak Flow  Pain Score 0     Pain Loc      Pain Edu?      Excl. in [place]?     Vital signs reviewed, nursing assessments reviewed.   Constitutional:   Alert and oriented. Non-toxic appearance. Eyes:   Conjunctivae are normal. EOMI. PERRL. ENT      Head:   Normocephalic and atraumatic.      Nose:   Wearing a mask.      Mouth/Throat:   Wearing a mask.      Neck:   No meningismus. Full ROM. Hematological/Lymphatic/Immunilogical:   No cervical lymphadenopathy. Cardiovascular:   RRR.  Cap refill less than 2 seconds. Respiratory:   Normal respiratory effort without tachypnea/retractions. Breath sounds are clear and equal bilaterally.  Musculoskeletal:   Normal range of motion in all extremities. No joint effusions.  No lower extremity tenderness.  No edema. Neurologic:   Normal speech and language.  Cranial nerves III through XII intact Left leg drift Relatively diminished sensation in left arm and left leg  NIH stroke scale 3 Skin:    Skin is warm, dry and intact. No rash noted.  No petechiae, purpura, or bullae.  ____________________________________________    LABS (pertinent positives/negatives) (all labs ordered are listed, but only abnormal results are displayed) Labs Reviewed  GLUCOSE, CAPILLARY - Abnormal; Notable for the following components:      Result Value   Glucose-Capillary 192 (*)    All other components within normal limits  CBC - Abnormal; Notable for the following components:   RBC 5.13 (*)    Hemoglobin 16.6 (*)    HCT 49.7 (*)    All other components within normal limits  COMPREHENSIVE METABOLIC PANEL - Abnormal; Notable for the following components:    Glucose, Bld 187 (*)    BUN 27 (*)    Creatinine, Ser 1.68 (*)    GFR calc non Af Amer 30 (*)    GFR calc Af Amer 35 (*)    All other components within normal limits  SARS CORONAVIRUS 2 BY RT PCR (HOSPITAL ORDER, [place] [facility])  PROTIME-INR  APTT  DIFFERENTIAL  CBG MONITORING, ED   ____________________________________________   EKG  Interpreted by me Sinus rhythm rate of 70, normal axis and intervals.  Poor R wave progression.  Normal ST segments.  Isolated T wave inversion in aVL which is nonspecific. ____________________________________________    RADIOLOGY  CT HEAD CODE STROKE WO CONTRAST  Result Date: 08/01/2019 CLINICAL DATA:  Code stroke. Possible stroke; neuro deficit, acute, stroke suspected. Additional history provided: Left leg weakness beginning 7:30 a.m. EXAM: CT HEAD WITHOUT CONTRAST TECHNIQUE: Contiguous axial images were obtained from the base of the skull through the vertex without intravenous contrast. COMPARISON:  Brain MRI/MRA 10/30/2016. FINDINGS: Brain: Redemonstrated chronic lacunar infarcts within the bilateral basal ganglia, right thalamus and within the pons. Additional patchy ill-defined hypoattenuation within the cerebral white matter is nonspecific, but consistent with chronic small vessel ischemic disease. Stable, mild generalized parenchymal atrophy. There is no acute intracranial hemorrhage. No acute demarcated cortical infarct is identified. No extra-axial fluid collection. No evidence of intracranial mass. No midline shift. Vascular: No definite hyperdense vessel. Atherosclerotic calcifications. Skull: Normal. Negative for fracture or focal lesion. Sinuses/Orbits: Visualized orbits show no acute finding. No significant paranasal sinus disease or mastoid effusion at the imaged levels. These results were called by telephone at the time of interpretation on 08/01/2019 at 12:17 pm to provider Moe Brier , who verbally acknowledged  these results. IMPRESSION: No CT evidence of acute intracranial abnormality. Moderate chronic small vessel ischemic disease with redemonstrated chronic lacunar infarcts in the bilateral basal ganglia, right thalamus and within the pons. Stable, mild generalized parenchymal atrophy. Electronically Signed   By: Kellie Simmering DO   On: 08/01/2019 12:18    ____________________________________________   PROCEDURES Procedures  ____________________________________________    CLINICAL IMPRESSION / ASSESSMENT AND PLAN / ED COURSE  Medications ordered in the ED: Medications  sodium chloride flush (NS) 0.9 % injection 3 mL (has no administration in time range)  aspirin chewable tablet 324 mg (324 mg Oral Given 08/01/19 1235)  clopidogrel (PLAVIX) tablet 75 mg (75 mg Oral Given 08/01/19 1235)    Pertinent labs & imaging results that were available during my care of the patient were reviewed by me and considered in my medical decision making (see chart for details).  Hannah Kirby was evaluated in Emergency Department on 08/01/2019 for the symptoms described in the history of present illness. She was evaluated in the context of the global COVID-19 pandemic, which necessitated consideration that the patient might be at risk for infection with the SARS-CoV-2 virus that causes COVID-19. Institutional protocols and algorithms that pertain to the evaluation of patients at risk for COVID-19 are in a state of rapid change based on information released by regulatory bodies including the CDC and federal and state organizations. These policies and algorithms were followed during the patient's care in the ED.   Code stroke initiated from triage due to concern for acute ischemic stroke, right at 4.5 hours from symptom onset at arrival in triage.  Clinical Course as of Aug 01 1307  Thu Aug 01, 2019  1210 Neurology Dr. Doy Mince at bedside evaluating patient on arrival to treatment bed.   [PS]  1223 Case discussed  with radiology, who notes lots of small vessel disease and old lacunar infarct, no acute stroke evident on CT.  Case discussed with neurology Dr. Doy Mince who recommends full dose aspirin and 75 mg Plavix for now, hospitalization for stroke work-up.  Advises that patient is outside of TPA window and stroke scale is only 2-3, so she is not an interventional candidate.   [PS]  1300 Creatinine consistent with baseline CKD  Creatinine(!): 1.68 [PS]    Clinical Course User Index [PS] Carrie Mew, MD     ____________________________________________   FINAL CLINICAL IMPRESSION(S) / ED DIAGNOSES    Final diagnoses:  Ischemic stroke ([place])  Type 2 diabetes mellitus without complication, with long-term current use of insulin [facility])     ED Discharge Orders    None      Portions of this note were generated with dragon dictation software. Dictation errors may occur despite best attempts at proofreading.   Carrie Mew, MD 08/01/19 1309

## 2019-08-01 NOTE — ED Notes (Signed)
MRI on phone to screen patient will be up soon to pick patient up. Sister at bedside. Vss. neuro assessment with no obvious changes noted. Safety maintained will monitor.

## 2019-08-01 NOTE — ED Notes (Signed)
Neurologist and EDP at bedside.

## 2019-08-01 NOTE — ED Notes (Signed)
Patient off unit to MRI.

## 2019-08-01 NOTE — ED Triage Notes (Signed)
Patient reports numbness, weakness and loss of balance. Woke up normal this morning. At 7:30 first noticed symptoms. Patient with numbness in left arm, weakness and drift in left leg.

## 2019-08-01 NOTE — Consult Note (Signed)
Requesting Physician: Joni Fears    Chief Complaint: Left sided weakness, difficulty with gait  I have been asked by Dr. Joni Fears to see this patient in consultation for code stroke.  HPI: Hannah Kirby is an 74 y.o. female with a history of stroke with mild LUE weakness as residual on ASA/Plavix, HTN, HLD, DM among other conditions who presents reporting that she awakened at baseline this morning.  At 0730 while getting dressed noted left sided weakness and numbness.  Initial NIHSS of 2.    Date last known well: 08/01/2019 Time last known well: Time: 07:30 tPA Given: No: Outside time window  Past Medical History:  Diagnosis Date  . Anemia   . Arthritis   . CAD (coronary artery disease)    a. cath 02/2006: BMS x 2 to RCA, cath o/w without significant coronary disease; b. nuclear stress test 07/2014: no signs of ischemia, no ekg changes concerning for ischemia, low risk study/normal study  . Chronic bronchitis ([place])    secondary to cigarette smoking  . Chronic kidney disease (CKD), stage III (moderate)   . COPD (chronic obstructive pulmonary disease) ([place])   . Diabetes mellitus   . FHx: allergies   . GERD (gastroesophageal reflux disease)   . Goiter   . 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 Hernia   . Hyperlipidemia   . Hypertension   . Kidney stone on left side 2013  . Microalbuminuria   . Obesity   . Smokers' cough ([place])   . Spinal stenosis   . Stroke ([place]) 10/29/2016   mild left side weakness    Past Surgical History:  Procedure Laterality Date  . ABDOMINAL HYSTERECTOMY    . BREAST SURGERY    . CATARACT EXTRACTION W/PHACO Right 03/01/2017   Procedure: CATARACT EXTRACTION PHACO AND INTRAOCULAR LENS PLACEMENT ([place]) RIGHT DIABETIC;  Surgeon: Leandrew Koyanagi, MD;  Location: [place];  Service: Ophthalmology;  Laterality: Right;  . CATARACT EXTRACTION W/PHACO Left 03/22/2017   Procedure: CATARACT EXTRACTION PHACO AND INTRAOCULAR LENS PLACEMENT ([place]) LEFT DIABETIC;   Surgeon: Leandrew Koyanagi, MD;  Location: [place];  Service: Ophthalmology;  Laterality: Left;  Diabetic - insulin and oral meds  . COLONOSCOPY WITH PROPOFOL N/A 09/23/2014   Procedure: COLONOSCOPY WITH PROPOFOL;  Surgeon: Lollie Sails, MD;  Location[facility] END[facility]COPY;  Service: Endoscopy;  Laterality: N/A;  . COLONOSCOPY WITH PROPOFOL N/A 01/11/2018   Procedure: COLONOSCOPY WITH PROPOFOL;  Surgeon: Lollie Sails, MD;  Location: [facility] ENDOSCOPY;  Service: Endoscopy;  Laterality: N/A;  . COLONOSCOPY WITH PROPOFOL N/A 04/24/2018   Procedure: COLONOSCOPY WITH PROPOFOL;  Surgeon: Lollie Sails, MD;  Location: [facility] ENDOSCOPY;  Service: Endoscopy;  Laterality: N/A;  . CORONARY ANGIOPLASTY WITH STENT PLACEMENT  2008  . ESOPHAGOGASTRODUODENOSCOPY (EGD) WITH PROPOFOL N/A 12/30/2014   Procedure: ESOPHAGOGASTRODUODENOSCOPY (EGD) WITH PROPOFOL;  Surgeon: Lollie Sails, MD;  Location: [facility] ENDOSCOPY;  Service: Endoscopy;  Laterality: N/A;  . ESOPHAGOGASTRODUODENOSCOPY (EGD) WITH PROPOFOL N/A 07/19/2016   Procedure: ESOPHAGOGASTRODUODENOSCOPY (EGD) WITH PROPOFOL;  Surgeon: Lollie Sails, MD;  Location: [facility] ENDOSCOPY;  Service: Endoscopy;  Laterality: N/A;  . ESOPHAGOGASTRODUODENOSCOPY (EGD) WITH PROPOFOL N/A 04/24/2018   Procedure: ESOPHAGOGASTRODUODENOSCOPY (EGD) WITH PROPOFOL;  Surgeon: Lollie Sails, MD;  Location: [facility] ENDOSCOPY;  Service: Endoscopy;  Laterality: N/A;  . hysterectomy (other)      Family History  Problem Relation Age of Onset  . Heart failure Mother   . Stroke Mother   . Diabetes Mother   . Congestive Heart  Failure Mother   . Lung cancer Father   . Breast cancer Maternal Aunt    Social History:  reports that she has been smoking cigarettes. She has a 30.00 pack-year smoking history. She has never used smokeless tobacco. She reports current alcohol use. She reports that she does not use drugs.  Allergies:  Allergies  Allergen Reactions  .  Spironolactone Shortness Of Breath  . Sulfonamide Derivatives Other (See Comments)    Last taken as a child; made her mouth break out  . Other Other (See Comments)    Mouth blisters  . Statins Other (See Comments)    Caused elevated liver function studies (02/22/17-pt taking rosuvastatin without problems) Caused elevated liver function studies Caused elevated liver function studies (02/22/17-pt taking rosuvastatin without problems) Caused elevated liver function studies  . Sulfa Antibiotics Other (See Comments)    Mouth blisters Welts on mouth   . Codeine Nausea And Vomiting and Other (See Comments)    Nausea  . Prednisone Palpitations and Other (See Comments)    Made her legs "feel weird."    Medications: I have reviewed the patient's current medications. Prior to Admission medications   Medication Sig Start Date End Date Taking? Authorizing Provider  acetaminophen (TYLENOL) 500 MG tablet Take 500 mg by mouth every 6 (six) hours as needed.    [provider]  albuterol (VENTOLIN HFA) 108 (90 Base) MCG/ACT inhaler Inhale 2 puffs into the lungs every 4 (four) hours as needed for wheezing or shortness of breath. 09/21/17   Birdie Sons, MD  amLODipine (NORVASC) 5 MG tablet Take 1 tablet (5 mg total) by mouth daily. 12/31/18   Minna Merritts, MD  aspirin (ASPIR-81) 81 MG EC tablet Take 81 mg by mouth daily.      [provider]  calcium carbonate (OSCAL) 1500 (600 Ca) MG TABS tablet Take by mouth.    [provider]  clopidogrel (PLAVIX) 75 MG tablet Take 1 tablet (75 mg total) by mouth daily. 12/31/18   Minna Merritts, MD  cyanocobalamin 1000 MCG tablet Take 1,000 mcg by mouth daily.    [provider]  Cysteamine Bitartrate (PROCYSBI) 300 MG PACK Use 1 each 3 ( three ) times daily as directed. Dispense Accu-chek Fast clix E11.21 10/01/18   [provider]  ezetimibe (ZETIA) 10 MG tablet Take 1 tablet (10 mg total) by mouth daily. 12/31/18    Minna Merritts, MD  fluticasone furoate-vilanterol (BREO ELLIPTA) 100-25 MCG/INH AEPB Inhale 1 puff into the lungs daily. 09/07/18   Birdie Sons, MD  gabapentin (NEURONTIN) 300 MG capsule Take one tablet at bedtime for 4 days, then twice a day for 4 days as needed 05/16/19   Birdie Sons, MD  glucose blood (ACCU-CHEK AVIVA PLUS) test strip Use 1 each (1 strip total) 3 (three) times daily Use as instructed. 10/01/18 10/01/19  [provider]  HYDROcodone-acetaminophen (NORCO/VICODIN) 5-325 MG tablet Take 1 tablet by mouth every 6 (six) hours as needed. 07/09/19   Birdie Sons, MD  insulin glargine (LANTUS) 100 UNIT/ML injection Inject 18 Units into the skin daily.     [provider]  Liraglutide (VICTOZA) 18 MG/3ML SOLN injection Inject 1.8 mg into the skin daily.     [provider]  lisinopril (ZESTRIL) 20 MG tablet Take 1 tablet (20 mg total) by mouth daily. 12/31/18   Minna Merritts, MD  meclizine (ANTIVERT) 25 MG tablet Take by mouth as needed.  07/06/11  [provider]  metoprolol tartrate (LOPRESSOR) 100 MG tablet Take 1 tablet (100 mg total) by mouth 2 (two) times daily. 12/31/18   Minna Merritts, MD  Multiple Vitamins-Iron TABS Take by mouth.    [provider]  Multiple Vitamins-Minerals (DAILY MULTI) TABS Take 1 tablet by mouth daily.     [provider]  nitroGLYCERIN (NITROSTAT) 0.4 MG SL tablet Place 1 tablet (0.4 mg total) under the tongue every 5 (five) minutes as needed. 11/22/17   Minna Merritts, MD  Omega-3 Fatty Acids (FISH OIL) 1000 MG CAPS Take 2 capsules by mouth 2 (two) times daily. Take 4    [provider]  omeprazole (PRILOSEC) 40 MG capsule Take 40 mg by mouth daily as needed.    [provider]  ondansetron (ZOFRAN ODT) 4 MG disintegrating tablet Take 1 tablet (4 mg total) by mouth every 8 (eight) hours as needed for nausea or vomiting. 08/06/16   Alfred Levins, [place], MD  RABEprazole  (ACIPHEX) 20 MG tablet Take 20 mg by mouth daily.    [provider]  rosuvastatin (CRESTOR) 5 MG tablet Take 1 tablet (5 mg total) by mouth daily. 12/31/18   Minna Merritts, MD  sucralfate (CARAFATE) 1 g tablet Take 1 g by mouth 2 (two) times daily.    [provider]  tiotropium (SPIRIVA) 18 MCG inhalation capsule Place 1 capsule (18 mcg total) into inhaler and inhale daily. 06/11/19   Birdie Sons, MD     ROS: History obtained from the patient  General ROS: negative for - chills, fatigue, fever, night sweats, weight gain or weight loss Psychological ROS: negative for - behavioral disorder, hallucinations, memory difficulties, mood swings or suicidal ideation Ophthalmic ROS: negative for - blurry vision, double vision, eye pain or loss of vision ENT ROS: negative for - epistaxis, nasal discharge, oral lesions, sore throat, tinnitus or vertigo Allergy and Immunology ROS: negative for - hives or itchy/watery eyes Hematological and Lymphatic ROS: negative for - bleeding problems, bruising or swollen lymph nodes Endocrine ROS: negative for - galactorrhea, hair pattern changes, polydipsia/polyuria or temperature intolerance Respiratory ROS: negative for - cough, hemoptysis, shortness of breath or wheezing Cardiovascular ROS: negative for - chest pain, dyspnea on exertion, edema or irregular heartbeat Gastrointestinal ROS: negative for - abdominal pain, diarrhea, hematemesis, nausea/vomiting or stool incontinence Genito-Urinary ROS: negative for - dysuria, hematuria, incontinence or urinary frequency/urgency Musculoskeletal ROS: negative for - joint swelling or muscular weakness Neurological ROS: as noted in HPI Dermatological ROS: negative for rash and skin lesion changes  Physical Examination: Blood pressure (!) 170/86, pulse 71, temperature 98.3 F (36.8 C), temperature source Oral, resp. rate 17, SpO2 100 %.  HEENT-  Normocephalic, no lesions, without obvious  abnormality.  Normal external eye and conjunctiva.  Normal TM's bilaterally.  Normal auditory canals and external ears. Normal external nose, mucus membranes and septum.  Normal pharynx. Cardiovascular- S1, S2 normal, pulses palpable throughout   Lungs- chest clear, no wheezing, rales, normal symmetric air entry Abdomen- soft, non-tender; bowel sounds normal; no masses,  no organomegaly Extremities- no edema Lymph-no adenopathy palpable Musculoskeletal-no joint tenderness, deformity or swelling Skin-warm and dry, no hyperpigmentation, vitiligo, or suspicious lesions  Neurological Examination   Mental Status: Alert, oriented, thought content appropriate.  Speech fluent without evidence of aphasia.  Able to follow 3 step commands without difficulty. Cranial Nerves: II: Visual fields grossly normal, pupils equal, round, reactive to light and accommodation III,IV, VI: ptosis not present, extra-ocular motions  intact bilaterally V,VII: smile symmetric, facial light touch sensation normal bilaterally VIII: hearing normal bilaterally IX,X: gag reflex present XI: bilateral shoulder shrug XII: midline tongue extension Motor: Right : Upper extremity   5/5    Left:     Upper extremity   5-/5  Lower extremity   5/5     Lower extremity   4/5 Tone and bulk:normal tone throughout; no atrophy noted Sensory: Pinprick and light touch decreased in the left upper and lower extremities Deep Tendon Reflexes: Symmetric throughout Plantars: Right: mute   Left: upgoing Cerebellar: Normal finger-to-nose and normal heel-to-shin testing bilaterally Gait: not tested due safety concerns    Laboratory Studies:  Basic Metabolic Panel: Recent Labs  Lab 08/01/19 1215  NA 141  K 3.5  CL 104  CO2 28  GLUCOSE 187*  BUN 27*  CREATININE 1.68*  CALCIUM 9.7    Liver Function Tests: Recent Labs  Lab 08/01/19 1215  AST 19  ALT 21  ALKPHOS 100  BILITOT 0.6  PROT 7.4  ALBUMIN 3.8   No results for  input(s): LIPASE, AMYLASE in the last 168 hours. No results for input(s): AMMONIA in the last 168 hours.  CBC: Recent Labs  Lab 08/01/19 1215  WBC 8.8  NEUTROABS 5.2  HGB 16.6*  HCT 49.7*  MCV 96.9  PLT 214    Cardiac Enzymes: No results for input(s): CKTOTAL, CKMB, CKMBINDEX, TROPONINI in the last 168 hours.  BNP: Invalid input(s): POCBNP  CBG: Recent Labs  Lab 08/01/19 1158  GLUCAP 192*    Microbiology: Results for orders placed or performed during the hospital encounter of 08/01/19  SARS Coronavirus 2 by RT PCR (hospital order, performed in [facility] hospital lab) Nasopharyngeal Nasopharyngeal Swab     Status: None   Collection Time: 08/01/19 12:24 PM   Specimen: Nasopharyngeal Swab  Result Value Ref Range Status   SARS Coronavirus 2 NEGATIVE NEGATIVE Final    Comment: (NOTE) SARS-CoV-2 target nucleic acids are NOT DETECTED.  The SARS-CoV-2 RNA is generally detectable in upper and lower respiratory specimens during the acute phase of infection. The lowest concentration of SARS-CoV-2 viral copies this assay can detect is 250 copies / mL. A negative result does not preclude SARS-CoV-2 infection and should not be used as the sole basis for treatment or other patient management decisions.  A negative result may occur with improper specimen collection / handling, submission of specimen other than nasopharyngeal swab, presence of viral mutation(s) within the areas targeted by this assay, and inadequate number of viral copies (<250 copies / mL). A negative result must be combined with clinical observations, patient history, and epidemiological information.  Fact Sheet for Patients:   StrictlyIdeas.no  Fact Sheet for Healthcare Providers: BankingDealers.co.za  This test is not yet approved or  cleared by the [place] FDA and has been authorized for detection and/or diagnosis of SARS-CoV-2 by FDA under an  Emergency Use Authorization (EUA).  This EUA will remain in effect (meaning this test can be used) for the duration of the COVID-19 declaration under Section 564(b)(1) of the Act, 21 U.S.C. section 360bbb-3(b)(1), unless the authorization is terminated or revoked sooner.  Performed at [facility], [address] [place], [place] [address]     Coagulation Studies: Recent Labs    08/01/19 1215  LABPROT 12.5  INR 1.0    Urinalysis: No results for input(s): COLORURINE, LABSPEC, PHURINE, GLUCOSEU, HGBUR, BILIRUBINUR, KETONESUR, PROTEINUR, UROBILINOGEN, NITRITE, LEUKOCYTESUR in the last 168 hours.  Invalid input(s):  APPERANCEUR  Lipid Panel:    Component Value Date/Time   CHOL 129 10/30/2016 0515   CHOL 79 (L) 05/12/2016 0748   TRIG 118 10/30/2016 0515   HDL 52 10/30/2016 0515   HDL 28 (L) 05/12/2016 0748   CHOLHDL 2.5 10/30/2016 0515   VLDL 24 10/30/2016 0515   LDLCALC 53 10/30/2016 0515   LDLCALC 17 05/12/2016 0748    HgbA1C:  Lab Results  Component Value Date   HGBA1C 6.9 10/09/2017    Urine Drug Screen:  No results found for: LABOPIA, COCAINSCRNUR, LABBENZ, AMPHETMU, THCU, LABBARB  Alcohol Level: No results for input(s): ETH in the last 168 hours.  Other results: EKG: sinus rhythm at 70 bpm.  Imaging: CT HEAD CODE STROKE WO CONTRAST  Result Date: 08/01/2019 CLINICAL DATA:  Code stroke. Possible stroke; neuro deficit, acute, stroke suspected. Additional history provided: Left leg weakness beginning 7:30 a.m. EXAM: CT HEAD WITHOUT CONTRAST TECHNIQUE: Contiguous axial images were obtained from the base of the skull through the vertex without intravenous contrast. COMPARISON:  Brain MRI/MRA 10/30/2016. FINDINGS: Brain: Redemonstrated chronic lacunar infarcts within the bilateral basal ganglia, right thalamus and within the pons. Additional patchy ill-defined hypoattenuation within the cerebral white matter is nonspecific, but consistent with chronic small  vessel ischemic disease. Stable, mild generalized parenchymal atrophy. There is no acute intracranial hemorrhage. No acute demarcated cortical infarct is identified. No extra-axial fluid collection. No evidence of intracranial mass. No midline shift. Vascular: No definite hyperdense vessel. Atherosclerotic calcifications. Skull: Normal. Negative for fracture or focal lesion. Sinuses/Orbits: Visualized orbits show no acute finding. No significant paranasal sinus disease or mastoid effusion at the imaged levels. These results were called by telephone at the time of interpretation on 08/01/2019 at 12:17 pm to provider PHILLIP STAFFORD , who verbally acknowledged these results. IMPRESSION: No CT evidence of acute intracranial abnormality. Moderate chronic small vessel ischemic disease with redemonstrated chronic lacunar infarcts in the bilateral basal ganglia, right thalamus and within the pons. Stable, mild generalized parenchymal atrophy. Electronically Signed   By: Kellie Simmering DO   On: 08/01/2019 12:18    Assessment: 74 y.o. female with a history of stroke with mild LUE weakness as residual on ASA/Plavix, HTN, HLD, DM among other conditions who presents reporting that she awakened at baseline this morning.  At 0730 while getting dressed noted left sided weakness and numbness.  With no resolution of symptoms patient presented for evaluation.  Patient on ASA and Plavix prior to presentation.  Head CT personally reviewed and shows no acute changes.  Acute infarct suspected.  Exam not consistent with LVO.  Further work up recommended.    Stroke Risk Factors - diabetes mellitus, hyperlipidemia and hypertension  Plan: 1. HgbA1c, fasting lipid panel 2. MRI, MRA  of the brain without contrast 3. PT consult, OT consult, Speech consult 4. Echocardiogram 5. Carotid dopplers 6. Prophylactic therapy-Continue ASA and Plavix 7. NPO until RN stroke swallow screen 8. Telemetry monitoring 9. Frequent neuro  checks   Alexis Goodell, MD Neurology [phone] 08/01/2019, 1:42 PM

## 2019-08-01 NOTE — ED Notes (Addendum)
corotid u/s and 2decho completed. Awaiting MRI

## 2019-08-01 NOTE — H&P (Signed)
History and Physical    Hannah Kirby IEP:[id number] DOB: 1945/09/23 DOA: 08/01/2019  PCP: Birdie Sons, MD   Patient coming from: Home  I have personally briefly reviewed patient's old medical records in [place]  Chief Complaint: Numbness and weakness  HPI: Hannah Kirby is a 74 y.o. female with medical history significant for COPD, diabetes mellitus with complications of chronic kidney disease, hypertension and history of a prior stroke with residual left- arm weakness who presents to the emergency room via EMS for evaluation of left-sided numbness as well as weakness involving her left lower extremity which is new.  Patient woke up this morning in her usual state of health and symptoms started at about 7:30 AM.  She arrived emergency room at noon, 4.5 hours after symptoms started.  Patient was seen by neurology at the bedside and had a CT scan of the head done without contrast was negative for bleed but showed an old lacunar infarct.  Patient was out of the window for TPA and had NIHSS of 2-3.  She received a dose of aspirin and Plavix in the emergency room. She denies having any chest pain, shortness of breath, nausea, vomiting, abdominal pain, urinary symptoms, fever or chills dizziness or lightheadedness.    ED Course: Patient is a 74 year old female with multiple medical problems who was seen in the emergency room as a code stroke she arrived the ER 4.5 hours from symptom onset and has been seen by neurology and is not deemed a candidate for TPA and due to her low NIHSS score she is not a candidate for any intervention as well.  She will be referred to observation status for further evaluation.  Review of Systems: As per HPI otherwise 10 point review of systems negative.    Past Medical History:  Diagnosis Date  . Anemia   . Arthritis   . CAD (coronary artery disease)    a. cath 02/2006: BMS x 2 to RCA, cath o/w without significant coronary disease; b. nuclear  stress test 07/2014: no signs of ischemia, no ekg changes concerning for ischemia, low risk study/normal study  . Chronic bronchitis ([place])    secondary to cigarette smoking  . Chronic kidney disease (CKD), stage III (moderate)   . COPD (chronic obstructive pulmonary disease) ([place])   . Diabetes mellitus   . FHx: allergies   . GERD (gastroesophageal reflux disease)   . Goiter   . Granulomatous disease ([place])   . Hernia   . Hyperlipidemia   . Hypertension   . Kidney stone on left side 2013  . Microalbuminuria   . Obesity   . Smokers' cough ([place])   . Spinal stenosis   . Stroke ([place]) 10/29/2016   mild left side weakness    Past Surgical History:  Procedure Laterality Date  . ABDOMINAL HYSTERECTOMY    . BREAST SURGERY    . CATARACT EXTRACTION W/PHACO Right 03/01/2017   Procedure: CATARACT EXTRACTION PHACO AND INTRAOCULAR LENS PLACEMENT ([place]) RIGHT DIABETIC;  Surgeon: Leandrew Koyanagi, MD;  Location: [place];  Service: Ophthalmology;  Laterality: Right;  . CATARACT EXTRACTION W/PHACO Left 03/22/2017   Procedure: CATARACT EXTRACTION PHACO AND INTRAOCULAR LENS PLACEMENT ([place]) LEFT DIABETIC;  Surgeon: Leandrew Koyanagi, MD;  Location: [place];  Service: Ophthalmology;  Laterality: Left;  Diabetic - insulin and oral meds  . COLONOSCOPY WITH PROPOFOL N/A 09/23/2014   Procedure: COLONOSCOPY WITH PROPOFOL;  Surgeon: Lollie Sails, MD;  Location: [facility] ENDOSCOPY;  Service:  Endoscopy;  Laterality: N/A;  . COLONOSCOPY WITH PROPOFOL N/A 01/11/2018   Procedure: COLONOSCOPY WITH PROPOFOL;  Surgeon: Lollie Sails, MD;  [facility] END[facility]COPY;  Service: Endoscopy;  Laterality: N/A;  . COLONOSCOPY WITH PROPOFOL N/A 04/24/2018   Procedure: COLONOSCOPY WITH PROPOFOL;  Surgeon: Lollie Sails, MD;  Location: [facility] ENDOSCOPY;  Service: Endoscopy;  Laterality: N/A;  . CORONARY ANGIOPLASTY WITH STENT PLACEMENT  2008  . ESOPHAGOGASTRODUODENOSCOPY (EGD) WITH PROPOFOL N/A  12/30/2014   Procedure: ESOPHAGOGASTRODUODENOSCOPY (EGD) WITH PROPOFOL;  Surgeon: Lollie Sails, MD;  Location: [facility] ENDOSCOPY;  Service: Endoscopy;  Laterality: N/A;  . ESOPHAGOGASTRODUODENOSCOPY (EGD) WITH PROPOFOL N/A 07/19/2016   Procedure: ESOPHAGOGASTRODUODENOSCOPY (EGD) WITH PROPOFOL;  Surgeon: Lollie Sails, MD;  Location: [facility] ENDOSCOPY;  Service: Endoscopy;  Laterality: N/A;  . ESOPHAGOGASTRODUODENOSCOPY (EGD) WITH PROPOFOL N/A 04/24/2018   Procedure: ESOPHAGOGASTRODUODENOSCOPY (EGD) WITH PROPOFOL;  Surgeon: Lollie Sails, MD;  Location: [facility] ENDOSCOPY;  Service: Endoscopy;  Laterality: N/A;  . hysterectomy (other)       reports that she has been smoking cigarettes. She has a 30.00 pack-year smoking history. She has never used smokeless tobacco. She reports current alcohol use. She reports that she does not use drugs.  Allergies  Allergen Reactions  . Spironolactone Shortness Of Breath  . Sulfonamide Derivatives Other (See Comments)    Last taken as a child; made her mouth break out  . Other Other (See Comments)    Mouth blisters  . Statins Other (See Comments)    Caused elevated liver function studies (02/22/17-pt taking rosuvastatin without problems) Caused elevated liver function studies Caused elevated liver function studies (02/22/17-pt taking rosuvastatin without problems) Caused elevated liver function studies  . Sulfa Antibiotics Other (See Comments)    Mouth blisters Welts on mouth   . Codeine Nausea And Vomiting and Other (See Comments)    Nausea  . Prednisone Palpitations and Other (See Comments)    Made her legs "feel weird."    Family History  Problem Relation Age of Onset  . Heart failure Mother   . Stroke Mother   . Diabetes Mother   . Congestive Heart Failure Mother   . Lung cancer Father   . Breast cancer Maternal Aunt      Prior to Admission medications   Medication Sig Start Date End Date Taking? Authorizing Provider  acetaminophen  (TYLENOL) 500 MG tablet Take 500 mg by mouth every 6 (six) hours as needed.    [provider]  albuterol (VENTOLIN HFA) 108 (90 Base) MCG/ACT inhaler Inhale 2 puffs into the lungs every 4 (four) hours as needed for wheezing or shortness of breath. 09/21/17   Birdie Sons, MD  amLODipine (NORVASC) 5 MG tablet Take 1 tablet (5 mg total) by mouth daily. 12/31/18   Minna Merritts, MD  aspirin (ASPIR-81) 81 MG EC tablet Take 81 mg by mouth daily.      [provider]  calcium carbonate (OSCAL) 1500 (600 Ca) MG TABS tablet Take by mouth.    [provider]  clopidogrel (PLAVIX) 75 MG tablet Take 1 tablet (75 mg total) by mouth daily. 12/31/18   Minna Merritts, MD  cyanocobalamin 1000 MCG tablet Take 1,000 mcg by mouth daily.    [provider]  Cysteamine Bitartrate (PROCYSBI) 300 MG PACK Use 1 each 3 ( three ) times daily as directed. Dispense Accu-chek Fast clix E11.21 10/01/18   [provider]  ezetimibe (ZETIA) 10 MG tablet Take 1 tablet (10 mg  total) by mouth daily. 12/31/18   Minna Merritts, MD  fluticasone furoate-vilanterol (BREO ELLIPTA) 100-25 MCG/INH AEPB Inhale 1 puff into the lungs daily. 09/07/18   Birdie Sons, MD  gabapentin (NEURONTIN) 300 MG capsule Take one tablet at bedtime for 4 days, then twice a day for 4 days as needed 05/16/19   Birdie Sons, MD  glucose blood (ACCU-CHEK AVIVA PLUS) test strip Use 1 each (1 strip total) 3 (three) times daily Use as instructed. 10/01/18 10/01/19  [provider]  HYDROcodone-acetaminophen (NORCO/VICODIN) 5-325 MG tablet Take 1 tablet by mouth every 6 (six) hours as needed. 07/09/19   Birdie Sons, MD  insulin glargine (LANTUS) 100 UNIT/ML injection Inject 18 Units into the skin daily.     [provider]  Liraglutide (VICTOZA) 18 MG/3ML SOLN injection Inject 1.8 mg into the skin daily.     [provider]  lisinopril (ZESTRIL) 20 MG tablet Take 1 tablet (20 mg  total) by mouth daily. 12/31/18   Minna Merritts, MD  meclizine (ANTIVERT) 25 MG tablet Take by mouth as needed.  07/06/11   [provider]  metoprolol tartrate (LOPRESSOR) 100 MG tablet Take 1 tablet (100 mg total) by mouth 2 (two) times daily. 12/31/18   Minna Merritts, MD  Multiple Vitamins-Iron TABS Take by mouth.    [provider]  Multiple Vitamins-Minerals (DAILY MULTI) TABS Take 1 tablet by mouth daily.     [provider]  nitroGLYCERIN (NITROSTAT) 0.4 MG SL tablet Place 1 tablet (0.4 mg total) under the tongue every 5 (five) minutes as needed. 11/22/17   Minna Merritts, MD  Omega-3 Fatty Acids (FISH OIL) 1000 MG CAPS Take 2 capsules by mouth 2 (two) times daily. Take 4    [provider]  omeprazole (PRILOSEC) 40 MG capsule Take 40 mg by mouth daily as needed.    [provider]  ondansetron (ZOFRAN ODT) 4 MG disintegrating tablet Take 1 tablet (4 mg total) by mouth every 8 (eight) hours as needed for nausea or vomiting. 08/06/16   Alfred Levins, [place], MD  RABEprazole (ACIPHEX) 20 MG tablet Take 20 mg by mouth daily.    [provider]  rosuvastatin (CRESTOR) 5 MG tablet Take 1 tablet (5 mg total) by mouth daily. 12/31/18   Minna Merritts, MD  sucralfate (CARAFATE) 1 g tablet Take 1 g by mouth 2 (two) times daily.    [provider]  tiotropium (SPIRIVA) 18 MCG inhalation capsule Place 1 capsule (18 mcg total) into inhaler and inhale daily. 06/11/19   Birdie Sons, MD    Physical Exam: Vitals:   08/01/19 1202 08/01/19 1238  BP: (!) 184/87 (!) 170/86  Pulse: 72 71  Resp: 16 17  Temp: 98.6 F (37 C) 98.3 F (36.8 C)  TempSrc: Oral Oral  SpO2: 100% 100%     Vitals:   08/01/19 1202 08/01/19 1238  BP: (!) 184/87 (!) 170/86  Pulse: 72 71  Resp: 16 17  Temp: 98.6 F (37 C) 98.3 F (36.8 C)  TempSrc: Oral Oral  SpO2: 100% 100%    Constitutional: NAD, alert and oriented x 3 Eyes: PERRL, lids and  conjunctivae normal ENMT: Mucous membranes are moist.  Neck: normal, supple, no masses, no thyromegaly Respiratory: clear to auscultation bilaterally, no wheezing, no crackles. Normal respiratory effort. No accessory muscle use.  Cardiovascular: Regular rate and rhythm, no murmurs / rubs / gallops. No extremity edema. 2+ pedal pulses. No  carotid bruits.  Abdomen: no tenderness, no masses palpated. No hepatosplenomegaly. Bowel sounds positive.  Musculoskeletal: no clubbing / cyanosis. No joint deformity upper and lower extremities.  Skin: no rashes, lesions, ulcers.  Neurologic: No gross focal neurologic deficit. Psychiatric: Normal mood and affect.   Labs on Admission: I have personally reviewed following labs and imaging studies  CBC: Recent Labs  Lab 08/01/19 1215  WBC 8.8  NEUTROABS 5.2  HGB 16.6*  HCT 49.7*  MCV 96.9  PLT 384   Basic Metabolic Panel: Recent Labs  Lab 08/01/19 1215  NA 141  K 3.5  CL 104  CO2 28  GLUCOSE 187*  BUN 27*  CREATININE 1.68*  CALCIUM 9.7   GFR: CrCl cannot be calculated (Unknown ideal weight.). Liver Function Tests: Recent Labs  Lab 08/01/19 1215  AST 19  ALT 21  ALKPHOS 100  BILITOT 0.6  PROT 7.4  ALBUMIN 3.8   No results for input(s): LIPASE, AMYLASE in the last 168 hours. No results for input(s): AMMONIA in the last 168 hours. Coagulation Profile: Recent Labs  Lab 08/01/19 1215  INR 1.0   Cardiac Enzymes: No results for input(s): CKTOTAL, CKMB, CKMBINDEX, TROPONINI in the last 168 hours. BNP (last 3 results) No results for input(s): PROBNP in the last 8760 hours. HbA1C: No results for input(s): HGBA1C in the last 72 hours. CBG: Recent Labs  Lab 08/01/19 1158  GLUCAP 192*   Lipid Profile: No results for input(s): CHOL, HDL, LDLCALC, TRIG, CHOLHDL, LDLDIRECT in the last 72 hours. Thyroid Function Tests: No results for input(s): TSH, T4TOTAL, FREET4, T3FREE, THYROIDAB in the last 72 hours. Anemia Panel: No  results for input(s): VITAMINB12, FOLATE, FERRITIN, TIBC, IRON, RETICCTPCT in the last 72 hours. Urine analysis:    Component Value Date/Time   COLORURINE YELLOW (A) 07/24/2014 1604   APPEARANCEUR CLEAR (A) 07/24/2014 1604   LABSPEC 1.026 07/24/2014 1604   PHURINE 5.0 07/24/2014 1604   GLUCOSEU NEGATIVE 07/24/2014 1604   HGBUR NEGATIVE 07/24/2014 1604   BILIRUBINUR NEGATIVE 07/24/2014 1604   KETONESUR NEGATIVE 07/24/2014 1604   PROTEINUR 100 (A) 07/24/2014 1604   NITRITE NEGATIVE 07/24/2014 1604   LEUKOCYTESUR NEGATIVE 07/24/2014 1604    Radiological Exams on Admission: CT HEAD CODE STROKE WO CONTRAST  Result Date: 08/01/2019 CLINICAL DATA:  Code stroke. Possible stroke; neuro deficit, acute, stroke suspected. Additional history provided: Left leg weakness beginning 7:30 a.m. EXAM: CT HEAD WITHOUT CONTRAST TECHNIQUE: Contiguous axial images were obtained from the base of the skull through the vertex without intravenous contrast. COMPARISON:  Brain MRI/MRA 10/30/2016. FINDINGS: Brain: Redemonstrated chronic lacunar infarcts within the bilateral basal ganglia, right thalamus and within the pons. Additional patchy ill-defined hypoattenuation within the cerebral white matter is nonspecific, but consistent with chronic small vessel ischemic disease. Stable, mild generalized parenchymal atrophy. There is no acute intracranial hemorrhage. No acute demarcated cortical infarct is identified. No extra-axial fluid collection. No evidence of intracranial mass. No midline shift. Vascular: No definite hyperdense vessel. Atherosclerotic calcifications. Skull: Normal. Negative for fracture or focal lesion. Sinuses/Orbits: Visualized orbits show no acute finding. No significant paranasal sinus disease or mastoid effusion at the imaged levels. These results were called by telephone at the time of interpretation on 08/01/2019 at 12:17 pm to provider PHILLIP STAFFORD , who verbally acknowledged these results.  IMPRESSION: No CT evidence of acute intracranial abnormality. Moderate chronic small vessel ischemic disease with redemonstrated chronic lacunar infarcts in the bilateral basal ganglia, right thalamus and within the pons. Stable, mild generalized  parenchymal atrophy. Electronically Signed   By: Kellie Simmering DO   On: 08/01/2019 12:18    EKG: Independently reviewed.  Sinus rhythm  Assessment/Plan Principal Problem:   TIA (transient ischemic attack) Active Problems:   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HCC)   Essential hypertension   Type 2 diabetes mellitus with diabetic chronic kidney disease ([place])     TIA Rule out an acute stroke Patient presents to the emergency room for evaluation of left-sided numbness and weakness and presented for 4.5 hours after onset of her symptoms Place patient on aspirin and continue Zetia and fish oil.  Patient unable to take statins due to allergy Obtain 2D echocardiogram to assess LVEF and rule out cardiac thrombus Obtain carotid Doppler to rule out carotid artery disease Obtain MRI of the brain to rule out an acute infarct We will request speech therapy, physical therapy and occupational therapy consult   Hypertension Hold all antihypertensives for now Allow for permissive hypertension We will place patient on labetalol 10 mg IV every 6 as needed systolic blood pressure greater than 27mmHg   Type 2 diabetes mellitus with complications of stage III chronic kidney disease Place patient on a consistent carbohydrate diet once cleared by speech therapy Sliding scale coverage  DVT prophylaxis: SCD Code Status: Full code Family Communication: Greater than 50% of time was spent discussing plan of care with patient at the bedside.  All questions and concerns have been addressed.  She verbalizes understanding and agrees with the plan. Disposition Plan: Back to previous home environment Consults called: Neurology    Collier Bullock MD [facility]     08/01/2019, 1:34 PM

## 2019-08-01 NOTE — Progress Notes (Signed)
*  PRELIMINARY RESULTS* Echocardiogram 2D Echocardiogram has been performed.  Hannah Kirby 08/01/2019, 2:38 PM
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
## 2019-08-01 NOTE — ED Notes (Signed)
Pt assisted to walk to toilet- gait unsteady but able to walk with 1 assist

## 2019-08-02 ENCOUNTER — Telehealth: Payer: Self-pay | Admitting: Cardiovascular Disease

## 2019-08-02 NOTE — Telephone Encounter (Signed)
Spoke with patient and neurology office provided her with recommendations and will be calling her to schedule appointment next week with Dr. Melrose Nakayama. She also will keep appointment with Dr. [name]. She was appreciative for the call back with no further questions at this time.

## 2019-08-02 NOTE — Telephone Encounter (Signed)
Patient calling  Patient had a bad experience at the ED yesterday and left early ED states she had a stroke - patient would like to know what to do going forward Patient does have an appointment with Dr [name] on 06/15

## 2019-08-02 NOTE — Telephone Encounter (Signed)
Spoke with patient and she states that she went to ED yesterday for stroke and then left due to numerous reasons. She wants to know what she can do to prevent this from getting worse or prevent it from happening again. Explained to her the importance of a complete work up and how dangerous this can be for her. She wanted to talk with a doctor and has appointment on 6/15 with Dr. [name] but didn't want to wait that long. Advised that he is not in the office today and that she really needs to talk with her neurologist. She reported calling and leaving message with them. Instructed her to monitor blood pressures 2 hours after medications to keep her BP less than 140/90. While we were discussing her situation she did get call back from neurology so she wanted me to call back after talking with them. Let her know that I would give her some time and would check back in but that ED, Neurology are really who should be helping her with present symptoms. She verbalized understanding.

## 2019-08-05 NOTE — Progress Notes (Signed)
Date:  08/06/2019   ID:  Hannah Kirby, DOB Jun 19, 1945, MRN [id number]  Patient Location:  [address] [place] [id number]   Provider location:   [facility], [place] office  PCP:  Birdie Sons, MD  Cardiologist:  [facility]   Chief Complaint  Patient presents with  . Other    Past due 6 month follow up. patient c/o SOB. Meds reviewed verbally with patient.      History of Present Illness:    Hannah Kirby is a 74 y.o. female who presents via Engineer, civil (consulting) for a telehealth visit today.   The patient does not symptoms concerning for COVID-19 infection (fever, chills, cough, or new SHORTNESS OF BREATH).   Patient has a past medical history of diabetes,  obesity,  Anxiety, panic attacks significantly improved with  weight loss on Victoza,  coronary artery disease with stenting of her RCA in January 2008,  long smoking history who continues to smoke,  hyperlipidemia  outbreak of her granulomatous disease in summary 2013, prednisone for this condition.  History pinched nerve,  on pain medication. Chronic constipation Chronic shortness of breath, COPD who presents for routine followup of her coronary artery disease and hypertension.  Last seen in clinic by myself December 31, 2018 At that time she was suffering from a torn retina, symptoms started with floaters and vision deficits  had laser treatment, and follow-up with [facility]  Other stressors at home, husband with health issues,  has leg swelling, "blood clots,   Stomach issues, nausea, pain" On oxygen Husband driving, she is unable to see  Blood pressure has been running low and she had stopped Norvasc and Cardura, And blood pressure was running high, had not restarted her medications  On that visit we recommend she restarted amlodipine 5, would likely need amlodipine 10  Last Thursday, June 10th 2021, acute leg weakness and left arm Went to  hospital, Records reviewed Normal echo 08/01/2019  CT head: No CT evidence of acute intracranial abnormality. Moderate chronic small vessel ischemic disease with redemonstrated chronic lacunar infarcts in the bilateral basal ganglia, right thalamus and within the pons. Stable, mild generalized parenchymal atrophy.  MRI 1. 8 mm acute lacunar infarct within the right thalamocapsular junction. 2. An additional small acute white matter infarct is questioned within the left corona radiata, as described.  "checked herself out"   HBA1C 7.1 to 8, had steroid shot Sees Dr. Melrose Nakayama tomorrow Feels left leg and arm is 50 to 60% of baseline  "BP elevated at home", she does not have numbers Today 138, prior to recent stroke was 270 systolic  Long history of smoking, continues to smoke Chronic shortness of breath, deconditioning  EKG personally reviewed by myself on todays visit Shows normal sinus rhythm rate 67 bpm consider old inferior MI, T wave abnormality one and aVL  Other past medical history reviewed  Stress test 11/2017  The study is normal.  This is a low risk study.  The left ventricular ejection fraction is normal (55-65%).  Previously had side effects on spironolactone, palpitations Was cutting the Cardura in half in the past secondary to dizziness On and off Norvasc was concerned this would make her dermatologic issues worse, turned out it did not have any effect   On aspirin Plavix for prior stroke 2018  In the ER 09/2017 ABD pain, nausea constipated Constipation is a chronic issue and she is only taking Colace  Chronically  elevated WBC   previous strokes on MRI  hospital with stroke 10/30/2016 Left upper extremity weakness on coming back from the beach while in the car She was on aspirin and Plavix at the time Echocardiogram normal ejection fractionno significant valve disease, moderate LVH, diastolic dysfunction  MRI brain: Small acute right basal ganglia  region infarct. Prior strokes MRA: negative. Carotid duplex showed bilateral 0-49% stenosis.  Prior CV studies:   The following studies were reviewed today:   Past Medical History:  Diagnosis Date  . Anemia   . Arthritis   . CAD (coronary artery disease)    a. cath 02/2006: BMS x 2 to RCA, cath o/w without significant coronary disease; b. nuclear stress test 07/2014: no signs of ischemia, no ekg changes concerning for ischemia, low risk study/normal study  . Chronic bronchitis ([place])    secondary to cigarette smoking  . Chronic kidney disease (CKD), stage III (moderate)   . COPD (chronic obstructive pulmonary disease) ([place])   . Diabetes mellitus   . FHx: allergies   . GERD (gastroesophageal reflux disease)   . Goiter   . Granulomatous disease ([place])   . Hernia   . Hyperlipidemia   . Hypertension   . Kidney stone on left side 2013  . Microalbuminuria   . Obesity   . Smokers' cough ([place])   . Spinal stenosis   . Stroke ([place]) 10/29/2016   mild left side weakness   Past Surgical History:  Procedure Laterality Date  . ABDOMINAL HYSTERECTOMY    . BREAST SURGERY    . CATARACT EXTRACTION W/PHACO Right 03/01/2017   Procedure: CATARACT EXTRACTION PHACO AND INTRAOCULAR LENS PLACEMENT ([place]) RIGHT DIABETIC;  Surgeon: Leandrew Koyanagi, MD;  Location: [place];  Service: Ophthalmology;  Laterality: Right;  . CATARACT EXTRACTION W/PHACO Left 03/22/2017   Procedure: CATARACT EXTRACTION PHACO AND INTRAOCULAR LENS PLACEMENT ([place]) LEFT DIABETIC;  Surgeon: Leandrew Koyanagi, MD;  Location: [place];  Service: Ophthalmology;  Laterality: Left;  Diabetic - insulin and oral meds  . COLONOSCOPY WITH PROPOFOL N/A 09/23/2014   Procedure: COLONOSCOPY WITH PROPOFOL;  Surgeon: Lollie Sails, MD;  Location: [facility] ENDOSCOPY;  Service: Endoscopy;  Laterality: N/A;  . COLONOSCOPY WITH PROPOFOL N/A 01/11/2018   Procedure: COLONOSCOPY WITH PROPOFOL;  Surgeon: Lollie Sails, MD;   Location: [facility] ENDOSCOPY;  Service: Endoscopy;  Laterality: N/A;  . COLONOSCOPY WITH PROPOFOL N/A 04/24/2018   Procedure: COLONOSCOPY WITH PROPOFOL;  Surgeon: Lollie Sails, MD;  Location: [facility] ENDOSCOPY;  Service: Endoscopy;  Laterality: N/A;  . CORONARY ANGIOPLASTY WITH STENT PLACEMENT  2008  . ESOPHAGOGASTRODUODENOSCOPY (EGD) WITH PROPOFOL N/A 12/30/2014   Procedure: ESOPHAGOGASTRODUODENOSCOPY (EGD) WITH PROPOFOL;  Surgeon: Lollie Sails, MD;  Location: [facility] ENDOSCOPY;  Service: Endoscopy;  Laterality: N/A;  . ESOPHAGOGASTRODUODENOSCOPY (EGD) WITH PROPOFOL N/A 07/19/2016   Procedure: ESOPHAGOGASTRODUODENOSCOPY (EGD) WITH PROPOFOL;  Surgeon: Lollie Sails, MD;  Location: [facility] ENDOSCOPY;  Service: Endoscopy;  Laterality: N/A;  . ESOPHAGOGASTRODUODENOSCOPY (EGD) WITH PROPOFOL N/A 04/24/2018   Procedure: ESOPHAGOGASTRODUODENOSCOPY (EGD) WITH PROPOFOL;  Surgeon: Lollie Sails, MD;  Location: [facility] ENDOSCOPY;  Service: Endoscopy;  Laterality: N/A;  . hysterectomy (other)      Current Outpatient Medications on File Prior to Visit  Medication Sig Dispense Refill  . acetaminophen (TYLENOL) 500 MG tablet Take 500 mg by mouth every 6 (six) hours as needed.    Marland Kitchen albuterol (VENTOLIN HFA) 108 (90 Base) MCG/ACT inhaler Inhale 2 puffs into the lungs every 4 (four) hours as needed  for wheezing or shortness of breath. 48 g 3  . amLODipine (NORVASC) 5 MG tablet Take 1 tablet (5 mg total) by mouth daily. 90 tablet 3  . aspirin (ASPIR-81) 81 MG EC tablet Take 81 mg by mouth daily.      . clopidogrel (PLAVIX) 75 MG tablet Take 1 tablet (75 mg total) by mouth daily. 90 tablet 3  . cyanocobalamin 1000 MCG tablet Take 1,000 mcg by mouth daily.    Marland Kitchen ezetimibe (ZETIA) 10 MG tablet Take 1 tablet (10 mg total) by mouth daily. 90 tablet 1  . fluticasone furoate-vilanterol (BREO ELLIPTA) 100-25 MCG/INH AEPB Inhale 1 puff into the lungs daily. 84 each 3  . HYDROcodone-acetaminophen (NORCO/VICODIN) 5-325 MG  tablet Take 1 tablet by mouth every 6 (six) hours as needed. 120 tablet 0  . insulin glargine (LANTUS) 100 UNIT/ML injection Inject 18 Units into the skin daily.     . Liraglutide (VICTOZA) 18 MG/3ML SOLN injection Inject 1.8 mg into the skin daily.     Marland Kitchen lisinopril (ZESTRIL) 20 MG tablet Take 1 tablet (20 mg total) by mouth daily. 90 tablet 3  . metoprolol tartrate (LOPRESSOR) 100 MG tablet Take 1 tablet (100 mg total) by mouth 2 (two) times daily. 180 tablet 3  . Multiple Vitamins-Minerals (DAILY MULTI) TABS Take 1 tablet by mouth daily.     . nitroGLYCERIN (NITROSTAT) 0.4 MG SL tablet Place 1 tablet (0.4 mg total) under the tongue every 5 (five) minutes as needed. 25 tablet 6  . Omega-3 Fatty Acids (FISH OIL) 1000 MG CAPS Take 2 capsules by mouth in the morning and at bedtime.     . ondansetron (ZOFRAN ODT) 4 MG disintegrating tablet Take 1 tablet (4 mg total) by mouth every 8 (eight) hours as needed for nausea or vomiting. 20 tablet 0  . RABEprazole (ACIPHEX) 20 MG tablet Take 20 mg by mouth daily. 15 Mins. before evening meal    . sucralfate (CARAFATE) 1 g tablet Take 1 g by mouth 2 (two) times daily. 15 Minutes before evening meal and at bed time    . tiotropium (SPIRIVA) 18 MCG inhalation capsule Place 1 capsule (18 mcg total) into inhaler and inhale daily. 90 capsule 4   No current facility-administered medications on file prior to visit.    Allergies:   Spironolactone, Sulfonamide derivatives, Other, Statins, Sulfa antibiotics, Codeine, and Prednisone   Social History   Tobacco Use  . Smoking status: Current Every Day Smoker    Packs/day: 0.75    Years: 40.00    Pack years: 30.00    Types: Cigarettes  . Smokeless tobacco: Never Used  Vaping Use  . Vaping Use: Former  Substance Use Topics  . Alcohol use: Yes    Comment: 1/month- wine  . Drug use: No      Family Hx: The patient's family history includes Breast cancer in her maternal aunt; Congestive Heart Failure in her  mother; Diabetes in her mother; Heart failure in her mother; Lung cancer in her father; Stroke in her mother.  ROS:   Please see the history of present illness.    Review of Systems  Constitutional: Negative.   Eyes: Positive for blurred vision.  Respiratory: Positive for shortness of breath.   Cardiovascular: Negative.   Gastrointestinal: Negative.   Musculoskeletal: Negative.   Skin: Positive for rash.  Neurological: Negative.   Psychiatric/Behavioral: Negative.   All other systems reviewed and are negative.    Labs/Other Tests and Data Reviewed:  Recent Labs: 08/01/2019: ALT 21; BUN 27; Creatinine, Ser 1.68; Hemoglobin 16.6; Platelets 214; Potassium 3.5; Sodium 141   Recent Lipid Panel Lab Results  Component Value Date/Time   CHOL 129 10/30/2016 05:15 AM   CHOL 79 (L) 05/12/2016 07:48 AM   TRIG 118 10/30/2016 05:15 AM   HDL 52 10/30/2016 05:15 AM   HDL 28 (L) 05/12/2016 07:48 AM   CHOLHDL 2.5 10/30/2016 05:15 AM   LDLCALC 53 10/30/2016 05:15 AM   LDLCALC 17 05/12/2016 07:48 AM    Wt Readings from Last 3 Encounters:  08/06/19 174 lb (78.9 kg)  06/11/19 176 lb 9.6 oz (80.1 kg)  05/08/19 174 lb 6.4 oz (79.1 kg)     Exam:    Vital Signs: Vital signs may also be detailed in the HPI BP 138/82 (BP Location: Right Arm, Patient Position: Sitting, Cuff Size: Normal)   Pulse 67   Ht 5\' 5"  (1.651 m)   Wt 174 lb (78.9 kg)   SpO2 93%   BMI 28.96 kg/m    Constitutional:  oriented to person, place, and time. No distress.  HENT:  Head: Grossly normal Eyes:  no discharge. No scleral icterus.  Neck: No JVD, no carotid bruits  Cardiovascular: Regular rate and rhythm, no murmurs appreciated Pulmonary/Chest: Clear to auscultation bilaterally, no wheezes or rails Abdominal: Soft.  no distension.  no tenderness.  Musculoskeletal: Normal range of motion Neurological:  normal muscle tone. Coordination normal. No atrophy Skin: Skin warm and dry Psychiatric: normal affect,  pleasant   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 (HCC) Currently with no symptoms of angina. No further workup at this time. Continue current medication regimen.  Dyspnea, unspecified type Long time smoker, chronic shortness of breath with underlying COPD, deconditioning Smoking cessation recommended  Essential hypertension On amlodipine 5 lisinopril metoprolol No changes, liberal BP post CVA  Renal insufficiency Stable renal function  Hyperlipidemia LDL goal <70 No recent lipid panel available  Chronic obstructive pulmonary disease, unspecified COPD type ([place]) Smoking cessation recommended Not on oxygen  Vision loss Problem with her retina, had laser procedure has follow-up with [facility]  CVA: Last week, chronic small vessel disease, continues to smoke We will continue aspirin Plavix, blood pressure reasonable, High-dose Crestor 10 for better lipid control with Zetia Needs PT, scheduled to see neurology tomorrow   Total encounter time more than 45 minutes  Greater than 50% was spent in counseling and coordination of care with the patient  Long discussion concerning her recent hospitalization Disposition: Follow-up in 3 months   Signed, Ida Rogue, MD  08/06/2019 10:05 AM    [place] Office [address], [place], [place] [address]

## 2019-08-06 ENCOUNTER — Ambulatory Visit (INDEPENDENT_AMBULATORY_CARE_PROVIDER_SITE_OTHER): Payer: Medicare Other | Admitting: Cardiovascular Disease

## 2019-08-06 ENCOUNTER — Other Ambulatory Visit: Payer: Self-pay

## 2019-08-06 ENCOUNTER — Encounter: Payer: Self-pay | Admitting: Cardiovascular Disease

## 2019-08-06 VITALS — BP 138/82 | HR 67 | Ht 65.0 in | Wt 174.0 lb

## 2019-08-06 DIAGNOSIS — I25118 Atherosclerotic heart disease of native coronary artery with other forms of angina pectoris: Secondary | ICD-10-CM

## 2019-08-06 DIAGNOSIS — I639 Cerebral infarction, unspecified: Secondary | ICD-10-CM

## 2019-08-06 DIAGNOSIS — I714 Abdominal aortic aneurysm, without rupture, unspecified: Secondary | ICD-10-CM

## 2019-08-06 DIAGNOSIS — F17200 Nicotine dependence, unspecified, uncomplicated: Secondary | ICD-10-CM

## 2019-08-06 DIAGNOSIS — J449 Chronic obstructive pulmonary disease, unspecified: Secondary | ICD-10-CM | POA: Diagnosis not present

## 2019-08-06 DIAGNOSIS — I10 Essential (primary) hypertension: Secondary | ICD-10-CM

## 2019-08-06 DIAGNOSIS — E785 Hyperlipidemia, unspecified: Secondary | ICD-10-CM

## 2019-08-06 DIAGNOSIS — N289 Disorder of kidney and ureter, unspecified: Secondary | ICD-10-CM

## 2019-08-06 DIAGNOSIS — R0600 Dyspnea, unspecified: Secondary | ICD-10-CM

## 2019-08-06 MED ORDER — ROSUVASTATIN CALCIUM 10 MG PO TABS: 10.0000 mg | ORAL_TABLET | Freq: Every day | ORAL | 3 refills | Status: DC

## 2019-08-06 NOTE — Patient Instructions (Addendum)
Medication Instructions:  Increase crestor to 10 mg daily  If you need a refill on your cardiac medications before your next appointment, please call your pharmacy.    Lab work: No new labs needed   If you have labs (blood work) drawn today and your tests are completely normal, you will receive your results only by: Marland Kitchen MyChart Message (if you have MyChart) OR . A paper copy in the mail If you have any lab test that is abnormal or we need to change your treatment, we will call you to review the results.   Testing/Procedures: No new testing needed   Follow-Up: At [facility], you and your health needs are our priority.  As part of our continuing mission to provide you with exceptional heart care, we have created designated Provider Care Teams.  These Care Teams include your primary Cardiologist (physician) and Advanced Practice Providers (APPs -  Physician Assistants and Nurse Practitioners) who all work together to provide you with the care you need, when you need it.  . You will need a follow up appointment in 3 months.    . Providers on your designated Care Team:   . Murray Hodgkins, NP . Christell Faith, PA-C . Marrianne Mood, PA-C  Any Other Special Instructions Will Be Listed Below (If Applicable).  For educational health videos Log in to : [email or web address] Or : [email or web address], password : [email or web address]

## 2019-08-07 ENCOUNTER — Other Ambulatory Visit: Payer: Self-pay

## 2019-08-07 DIAGNOSIS — Z882 Allergy status to sulfonamides status: Secondary | ICD-10-CM

## 2019-08-07 DIAGNOSIS — K5732 Diverticulitis of large intestine without perforation or abscess without bleeding: Secondary | ICD-10-CM | POA: Diagnosis present

## 2019-08-07 DIAGNOSIS — E1122 Type 2 diabetes mellitus with diabetic chronic kidney disease: Secondary | ICD-10-CM | POA: Diagnosis present

## 2019-08-07 DIAGNOSIS — E876 Hypokalemia: Secondary | ICD-10-CM | POA: Diagnosis present

## 2019-08-07 DIAGNOSIS — I2510 Atherosclerotic heart disease of native coronary artery without angina pectoris: Secondary | ICD-10-CM | POA: Diagnosis present

## 2019-08-07 DIAGNOSIS — Z7982 Long term (current) use of aspirin: Secondary | ICD-10-CM

## 2019-08-07 DIAGNOSIS — I69354 Hemiplegia and hemiparesis following cerebral infarction affecting left non-dominant side: Secondary | ICD-10-CM

## 2019-08-07 DIAGNOSIS — Z7902 Long term (current) use of antithrombotics/antiplatelets: Secondary | ICD-10-CM

## 2019-08-07 DIAGNOSIS — I700 Atherosclerosis of aorta: Secondary | ICD-10-CM | POA: Diagnosis present

## 2019-08-07 DIAGNOSIS — Z79899 Other long term (current) drug therapy: Secondary | ICD-10-CM

## 2019-08-07 DIAGNOSIS — Z961 Presence of intraocular lens: Secondary | ICD-10-CM | POA: Diagnosis present

## 2019-08-07 DIAGNOSIS — N1832 Chronic kidney disease, stage 3b: Secondary | ICD-10-CM | POA: Diagnosis present

## 2019-08-07 DIAGNOSIS — A0472 Enterocolitis due to Clostridium difficile, not specified as recurrent: Secondary | ICD-10-CM | POA: Diagnosis not present

## 2019-08-07 DIAGNOSIS — E860 Dehydration: Secondary | ICD-10-CM | POA: Diagnosis not present

## 2019-08-07 DIAGNOSIS — Z955 Presence of coronary angioplasty implant and graft: Secondary | ICD-10-CM

## 2019-08-07 DIAGNOSIS — Z8249 Family history of ischemic heart disease and other diseases of the circulatory system: Secondary | ICD-10-CM

## 2019-08-07 DIAGNOSIS — E785 Hyperlipidemia, unspecified: Secondary | ICD-10-CM | POA: Diagnosis present

## 2019-08-07 DIAGNOSIS — Z7951 Long term (current) use of inhaled steroids: Secondary | ICD-10-CM

## 2019-08-07 DIAGNOSIS — Z794 Long term (current) use of insulin: Secondary | ICD-10-CM

## 2019-08-07 DIAGNOSIS — I129 Hypertensive chronic kidney disease with stage 1 through stage 4 chronic kidney disease, or unspecified chronic kidney disease: Secondary | ICD-10-CM | POA: Diagnosis present

## 2019-08-07 DIAGNOSIS — F17210 Nicotine dependence, cigarettes, uncomplicated: Secondary | ICD-10-CM | POA: Diagnosis present

## 2019-08-07 DIAGNOSIS — J449 Chronic obstructive pulmonary disease, unspecified: Secondary | ICD-10-CM | POA: Diagnosis present

## 2019-08-07 DIAGNOSIS — Z9841 Cataract extraction status, right eye: Secondary | ICD-10-CM

## 2019-08-07 DIAGNOSIS — Z823 Family history of stroke: Secondary | ICD-10-CM

## 2019-08-07 DIAGNOSIS — Z888 Allergy status to other drugs, medicaments and biological substances status: Secondary | ICD-10-CM

## 2019-08-07 DIAGNOSIS — Z833 Family history of diabetes mellitus: Secondary | ICD-10-CM

## 2019-08-07 DIAGNOSIS — Z885 Allergy status to narcotic agent status: Secondary | ICD-10-CM

## 2019-08-07 DIAGNOSIS — Z9842 Cataract extraction status, left eye: Secondary | ICD-10-CM

## 2019-08-07 DIAGNOSIS — K219 Gastro-esophageal reflux disease without esophagitis: Secondary | ICD-10-CM | POA: Diagnosis present

## 2019-08-07 DIAGNOSIS — Z87442 Personal history of urinary calculi: Secondary | ICD-10-CM

## 2019-08-07 DIAGNOSIS — Z20822 Contact with and (suspected) exposure to covid-19: Secondary | ICD-10-CM | POA: Diagnosis present

## 2019-08-07 DIAGNOSIS — N200 Calculus of kidney: Secondary | ICD-10-CM | POA: Diagnosis present

## 2019-08-07 LAB — COMPREHENSIVE METABOLIC PANEL
ALT: 21 U/L (ref 0–44)
AST: 26 U/L (ref 15–41)
Albumin: 4.1 g/dL (ref 3.5–5.0)
Alkaline Phosphatase: 89 U/L (ref 38–126)
Anion gap: 12 (ref 5–15)
BUN: 24 mg/dL — ABNORMAL HIGH (ref 8–23)
CO2: 27 mmol/L (ref 22–32)
Calcium: 10.1 mg/dL (ref 8.9–10.3)
Chloride: 101 mmol/L (ref 98–111)
Creatinine, Ser: 1.41 mg/dL — ABNORMAL HIGH (ref 0.44–1.00)
GFR calc Af Amer: 43 mL/min — ABNORMAL LOW (ref >60)
GFR calc non Af Amer: 37 mL/min — ABNORMAL LOW (ref >60)
Glucose, Bld: 199 mg/dL — ABNORMAL HIGH (ref 70–99)
Potassium: 3.4 mmol/L — ABNORMAL LOW (ref 3.5–5.1)
Sodium: 140 mmol/L (ref 135–145)
Total Bilirubin: 0.9 mg/dL (ref 0.3–1.2)
Total Protein: 7.7 g/dL (ref 6.5–8.1)

## 2019-08-07 LAB — URINALYSIS, COMPLETE (UACMP) WITH MICROSCOPIC
Bacteria, UA: NONE SEEN
Bilirubin Urine: NEGATIVE
Glucose, UA: NEGATIVE mg/dL
Ketones, ur: NEGATIVE mg/dL
Leukocytes,Ua: NEGATIVE
Nitrite: NEGATIVE
Protein, ur: >=300 mg/dL — AB
Specific Gravity, Urine: 1.022 (ref 1.005–1.030)
pH: 5 (ref 5.0–8.0)

## 2019-08-07 LAB — TYPE AND SCREEN
ABO/RH(D): O POS
Antibody Screen: NEGATIVE

## 2019-08-07 LAB — CBC
HCT: 52.4 % — ABNORMAL HIGH (ref 36.0–46.0)
Hemoglobin: 17.7 g/dL — ABNORMAL HIGH (ref 12.0–15.0)
MCH: 32.4 pg (ref 26.0–34.0)
MCHC: 33.8 g/dL (ref 30.0–36.0)
MCV: 95.8 fL (ref 80.0–100.0)
Platelets: 240 10*3/uL (ref 150–400)
RBC: 5.47 MIL/uL — ABNORMAL HIGH (ref 3.87–5.11)
RDW: 13.6 % (ref 11.5–15.5)
WBC: 18.6 10*3/uL — ABNORMAL HIGH (ref 4.0–10.5)
nRBC: 0 % (ref 0.0–0.2)

## 2019-08-07 LAB — LIPASE, BLOOD: Lipase: 31 U/L (ref 11–51)

## 2019-08-07 MED ORDER — SODIUM CHLORIDE 0.9% FLUSH
3.0000 mL | Freq: Once | INTRAVENOUS | Status: AC
  Administered 2019-08-08: 3 mL via INTRAVENOUS

## 2019-08-07 NOTE — ED Triage Notes (Signed)
PT to ED via EMS from home c/o diarrhea since this afternoon, abd cramping and bright red blood in stool. No dizziness or light headedness, color WDL. PT takes plavix.

## 2019-08-08 ENCOUNTER — Inpatient Hospital Stay
Admission: EM | Admit: 2019-08-08 | Discharge: 2019-08-09 | DRG: 372 | Disposition: A | Discharge status: Home / Self Care | Payer: Medicare Other | Attending: Internal Medicine | Admitting: Internal Medicine

## 2019-08-08 ENCOUNTER — Emergency Department: Payer: Medicare Other

## 2019-08-08 DIAGNOSIS — E1129 Type 2 diabetes mellitus with other diabetic kidney complication: Secondary | ICD-10-CM | POA: Diagnosis present

## 2019-08-08 DIAGNOSIS — F17210 Nicotine dependence, cigarettes, uncomplicated: Secondary | ICD-10-CM | POA: Diagnosis present

## 2019-08-08 DIAGNOSIS — E876 Hypokalemia: Secondary | ICD-10-CM | POA: Diagnosis present

## 2019-08-08 DIAGNOSIS — N1832 Chronic kidney disease, stage 3b: Secondary | ICD-10-CM | POA: Diagnosis not present

## 2019-08-08 DIAGNOSIS — F17200 Nicotine dependence, unspecified, uncomplicated: Secondary | ICD-10-CM

## 2019-08-08 DIAGNOSIS — K529 Noninfective gastroenteritis and colitis, unspecified: Secondary | ICD-10-CM | POA: Diagnosis present

## 2019-08-08 DIAGNOSIS — I10 Essential (primary) hypertension: Secondary | ICD-10-CM

## 2019-08-08 DIAGNOSIS — I639 Cerebral infarction, unspecified: Secondary | ICD-10-CM

## 2019-08-08 DIAGNOSIS — A0472 Enterocolitis due to Clostridium difficile, not specified as recurrent: Secondary | ICD-10-CM | POA: Diagnosis present

## 2019-08-08 DIAGNOSIS — K219 Gastro-esophageal reflux disease without esophagitis: Secondary | ICD-10-CM | POA: Diagnosis present

## 2019-08-08 DIAGNOSIS — K625 Hemorrhage of anus and rectum: Secondary | ICD-10-CM

## 2019-08-08 DIAGNOSIS — I2510 Atherosclerotic heart disease of native coronary artery without angina pectoris: Secondary | ICD-10-CM | POA: Diagnosis present

## 2019-08-08 DIAGNOSIS — Z8673 Personal history of transient ischemic attack (TIA), and cerebral infarction without residual deficits: Secondary | ICD-10-CM | POA: Diagnosis present

## 2019-08-08 DIAGNOSIS — E860 Dehydration: Secondary | ICD-10-CM

## 2019-08-08 DIAGNOSIS — E1122 Type 2 diabetes mellitus with diabetic chronic kidney disease: Secondary | ICD-10-CM | POA: Diagnosis present

## 2019-08-08 HISTORY — DX: Noninfective gastroenteritis and colitis, unspecified: K52.9

## 2019-08-08 LAB — GASTROINTESTINAL PANEL BY PCR, STOOL (REPLACES STOOL CULTURE)

## 2019-08-08 LAB — CBC
HCT: 48.6 % — ABNORMAL HIGH (ref 36.0–46.0)
HCT: 45.8 % (ref 36.0–46.0)
Hemoglobin: 16.4 g/dL — ABNORMAL HIGH (ref 12.0–15.0)
Hemoglobin: 16.1 g/dL — ABNORMAL HIGH (ref 12.0–15.0)
MCH: 32.5 pg (ref 26.0–34.0)
MCH: 32.9 pg (ref 26.0–34.0)
MCHC: 33.7 g/dL (ref 30.0–36.0)
MCHC: 35.2 g/dL (ref 30.0–36.0)
MCV: 96.4 fL (ref 80.0–100.0)
MCV: 93.7 fL (ref 80.0–100.0)
Platelets: 227 10*3/uL (ref 150–400)
Platelets: 199 10*3/uL (ref 150–400)
RBC: 5.04 MIL/uL (ref 3.87–5.11)
RBC: 4.89 MIL/uL (ref 3.87–5.11)
RDW: 13.7 % (ref 11.5–15.5)
RDW: 13.9 % (ref 11.5–15.5)
WBC: 20.3 10*3/uL — ABNORMAL HIGH (ref 4.0–10.5)
WBC: 18.3 10*3/uL — ABNORMAL HIGH (ref 4.0–10.5)
nRBC: 0 % (ref 0.0–0.2)
nRBC: 0 % (ref 0.0–0.2)

## 2019-08-08 LAB — APTT: aPTT: 31 seconds (ref 24–36)

## 2019-08-08 LAB — PROTIME-INR
INR: 1 (ref 0.8–1.2)
Prothrombin Time: 12.4 seconds (ref 11.4–15.2)

## 2019-08-08 LAB — TROPONIN I (HIGH SENSITIVITY): Troponin I (High Sensitivity): 13 ng/L (ref <18)

## 2019-08-08 LAB — C DIFFICILE QUICK SCREEN W PCR REFLEX
C Diff antigen: POSITIVE — AB
C Diff toxin: NEGATIVE

## 2019-08-08 LAB — GLUCOSE, CAPILLARY
Glucose-Capillary: 180 mg/dL — ABNORMAL HIGH (ref 70–99)
Glucose-Capillary: 161 mg/dL — ABNORMAL HIGH (ref 70–99)
Glucose-Capillary: 221 mg/dL — ABNORMAL HIGH (ref 70–99)

## 2019-08-08 LAB — SARS CORONAVIRUS 2 BY RT PCR (HOSPITAL ORDER, PERFORMED IN ~~LOC~~ HOSPITAL LAB): SARS Coronavirus 2: NEGATIVE

## 2019-08-08 LAB — CLOSTRIDIUM DIFFICILE BY PCR, REFLEXED: Toxigenic C. Difficile by PCR: POSITIVE — AB

## 2019-08-08 LAB — OCCULT BLOOD X 1 CARD TO LAB, STOOL: Fecal Occult Bld: POSITIVE — AB

## 2019-08-08 MED ORDER — ONDANSETRON 4 MG PO TBDP
ORAL_TABLET | ORAL | Status: AC
  Filled 2019-08-08: qty 1

## 2019-08-08 MED ORDER — VANCOMYCIN 50 MG/ML ORAL SOLUTION
125.0000 mg | Freq: Four times a day (QID) | ORAL | Status: DC
  Administered 2019-08-08 – 2019-08-09 (×4): 125 mg via ORAL
  Filled 2019-08-08 (×6): qty 2.5

## 2019-08-08 MED ORDER — HYDRALAZINE HCL 20 MG/ML IJ SOLN: 5.0000 mg | INTRAMUSCULAR | Status: DC | PRN

## 2019-08-08 MED ORDER — AMLODIPINE BESYLATE 5 MG PO TABS
5.0000 mg | ORAL_TABLET | Freq: Every day | ORAL | Status: DC
  Administered 2019-08-08 – 2019-08-09 (×2): 5 mg via ORAL
  Filled 2019-08-08 (×2): qty 1

## 2019-08-08 MED ORDER — IOHEXOL 9 MG/ML PO SOLN
500.0000 mL | Freq: Once | ORAL | Status: AC | PRN
  Administered 2019-08-08 (×2): 500 mL via ORAL

## 2019-08-08 MED ORDER — PIPERACILLIN-TAZOBACTAM 3.375 G IVPB 30 MIN
3.3750 g | Freq: Once | INTRAVENOUS | Status: AC
  Administered 2019-08-08: 3.375 g via INTRAVENOUS
  Filled 2019-08-08: qty 50

## 2019-08-08 MED ORDER — EZETIMIBE 10 MG PO TABS
10.0000 mg | ORAL_TABLET | Freq: Every day | ORAL | Status: DC
  Administered 2019-08-08 – 2019-08-09 (×2): 10 mg via ORAL
  Filled 2019-08-08 (×2): qty 1

## 2019-08-08 MED ORDER — TIOTROPIUM BROMIDE MONOHYDRATE 18 MCG IN CAPS
18.0000 ug | ORAL_CAPSULE | Freq: Every day | RESPIRATORY_TRACT | Status: DC
  Administered 2019-08-08 – 2019-08-09 (×2): 18 ug via RESPIRATORY_TRACT
  Filled 2019-08-08: qty 5

## 2019-08-08 MED ORDER — NICOTINE 21 MG/24HR TD PT24
21.0000 mg | MEDICATED_PATCH | Freq: Every day | TRANSDERMAL | Status: DC
  Administered 2019-08-08 – 2019-08-09 (×2): 21 mg via TRANSDERMAL
  Filled 2019-08-08 (×2): qty 1

## 2019-08-08 MED ORDER — PIPERACILLIN-TAZOBACTAM 3.375 G IVPB
3.3750 g | Freq: Three times a day (TID) | INTRAVENOUS | Status: DC
  Administered 2019-08-08 – 2019-08-09 (×4): 3.375 g via INTRAVENOUS
  Filled 2019-08-08 (×4): qty 50

## 2019-08-08 MED ORDER — PANTOPRAZOLE SODIUM 40 MG PO TBEC
40.0000 mg | DELAYED_RELEASE_TABLET | Freq: Every day | ORAL | Status: DC
  Administered 2019-08-08 – 2019-08-09 (×2): 40 mg via ORAL
  Filled 2019-08-08 (×2): qty 1

## 2019-08-08 MED ORDER — INSULIN GLARGINE 100 UNIT/ML ~~LOC~~ SOLN
15.0000 [IU] | Freq: Every day | SUBCUTANEOUS | Status: DC
  Administered 2019-08-08 – 2019-08-09 (×2): 15 [IU] via SUBCUTANEOUS
  Filled 2019-08-08 (×3): qty 0.15

## 2019-08-08 MED ORDER — VITAMIN B-12 1000 MCG PO TABS
1000.0000 ug | ORAL_TABLET | Freq: Every day | ORAL | Status: DC
  Administered 2019-08-08 – 2019-08-09 (×2): 1000 ug via ORAL
  Filled 2019-08-08 (×2): qty 1

## 2019-08-08 MED ORDER — MORPHINE SULFATE (PF) 2 MG/ML IV SOLN: 1.0000 mg | INTRAVENOUS | Status: DC | PRN

## 2019-08-08 MED ORDER — ONDANSETRON HCL 4 MG/2ML IJ SOLN: 4.0000 mg | Freq: Three times a day (TID) | INTRAMUSCULAR | Status: DC | PRN

## 2019-08-08 MED ORDER — ONDANSETRON 4 MG PO TBDP
4.0000 mg | ORAL_TABLET | Freq: Once | ORAL | Status: AC
  Administered 2019-08-08: 4 mg via ORAL

## 2019-08-08 MED ORDER — HYDROCODONE-ACETAMINOPHEN 5-325 MG PO TABS: 1.0000 | ORAL_TABLET | Freq: Four times a day (QID) | ORAL | Status: DC | PRN

## 2019-08-08 MED ORDER — FLUTICASONE FUROATE-VILANTEROL 100-25 MCG/INH IN AEPB
1.0000 | INHALATION_SPRAY | Freq: Every day | RESPIRATORY_TRACT | Status: DC
  Administered 2019-08-08 – 2019-08-09 (×2): 1 via RESPIRATORY_TRACT
  Filled 2019-08-08: qty 28

## 2019-08-08 MED ORDER — METOPROLOL TARTRATE 50 MG PO TABS
100.0000 mg | ORAL_TABLET | Freq: Two times a day (BID) | ORAL | Status: DC
  Administered 2019-08-08 – 2019-08-09 (×3): 100 mg via ORAL
  Filled 2019-08-08 (×3): qty 2

## 2019-08-08 MED ORDER — ADULT MULTIVITAMIN W/MINERALS CH
1.0000 | ORAL_TABLET | Freq: Every day | ORAL | Status: DC
  Administered 2019-08-08 – 2019-08-09 (×2): 1 via ORAL
  Filled 2019-08-08 (×2): qty 1

## 2019-08-08 MED ORDER — ALBUTEROL SULFATE HFA 108 (90 BASE) MCG/ACT IN AERS
2.0000 | INHALATION_SPRAY | RESPIRATORY_TRACT | Status: DC | PRN
  Filled 2019-08-08: qty 6.7

## 2019-08-08 MED ORDER — OMEGA-3-ACID ETHYL ESTERS 1 G PO CAPS
1.0000 g | ORAL_CAPSULE | Freq: Every day | ORAL | Status: DC
  Administered 2019-08-08 – 2019-08-09 (×2): 1 g via ORAL
  Filled 2019-08-08 (×2): qty 1

## 2019-08-08 MED ORDER — ACETAMINOPHEN 325 MG PO TABS
650.0000 mg | ORAL_TABLET | Freq: Four times a day (QID) | ORAL | Status: DC | PRN
  Administered 2019-08-08 (×2): 650 mg via ORAL
  Filled 2019-08-08 (×2): qty 2

## 2019-08-08 MED ORDER — ROSUVASTATIN CALCIUM 10 MG PO TABS
10.0000 mg | ORAL_TABLET | Freq: Every day | ORAL | Status: DC
  Administered 2019-08-08 – 2019-08-09 (×2): 10 mg via ORAL
  Filled 2019-08-08 (×2): qty 1

## 2019-08-08 MED ORDER — SUCRALFATE 1 G PO TABS
1.0000 g | ORAL_TABLET | Freq: Two times a day (BID) | ORAL | Status: DC
  Administered 2019-08-08 – 2019-08-09 (×3): 1 g via ORAL
  Filled 2019-08-08 (×3): qty 1

## 2019-08-08 MED ORDER — INSULIN ASPART 100 UNIT/ML ~~LOC~~ SOLN
0.0000 [IU] | SUBCUTANEOUS | Status: DC
  Administered 2019-08-08: 2 [IU] via SUBCUTANEOUS
  Administered 2019-08-08: 3 [IU] via SUBCUTANEOUS
  Administered 2019-08-08 – 2019-08-09 (×3): 2 [IU] via SUBCUTANEOUS
  Filled 2019-08-08 (×5): qty 1

## 2019-08-08 MED ORDER — POTASSIUM CHLORIDE CRYS ER 20 MEQ PO TBCR
20.0000 meq | EXTENDED_RELEASE_TABLET | Freq: Once | ORAL | Status: AC
  Administered 2019-08-08: 20 meq via ORAL
  Filled 2019-08-08: qty 1

## 2019-08-08 MED ORDER — SODIUM CHLORIDE 0.9 % IV SOLN: INTRAVENOUS | Status: DC

## 2019-08-08 MED ORDER — LISINOPRIL 20 MG PO TABS
20.0000 mg | ORAL_TABLET | Freq: Every day | ORAL | Status: DC
  Administered 2019-08-08 – 2019-08-09 (×2): 20 mg via ORAL
  Filled 2019-08-08: qty 2
  Filled 2019-08-08: qty 1

## 2019-08-08 MED ORDER — NITROGLYCERIN 0.4 MG SL SUBL: 0.4000 mg | SUBLINGUAL_TABLET | SUBLINGUAL | Status: DC | PRN

## 2019-08-08 NOTE — Plan of Care (Signed)

## 2019-08-08 NOTE — Consult Note (Signed)
Hannah Lame, MD [facility]  [address] [place], [place] [address] Phone: [phone] Fax : [phone]  Consultation  Referring Provider:     Dr. Blaine Hamper Primary Care Physician:  Hannah Sons, MD Primary Gastroenterologist: [facility]        Reason for Consultation:     Rectal bleeding  Date of Admission:  08/08/2019 Date of Consultation:  08/08/2019         HPI:   Hannah Kirby is a 74 y.o. female who was recently discharged from the hospital due to a stroke.  The patient has been on Plavix and was home for a week when she had abdominal pain in the lower abdomen and reported having bloody diarrhea.  The patient had stool sent off that showed her PCR to be positive but the toxin was negative while the antigen was positive.  The patient has been started in the ER on Zosyn and oral vancomycin.  The patient had a CT scan of the abdomen that showed wall thickening and inflammation involving the colon from the distal transverse colon to the mid sigmoid compatible with infectious versus inflammatory colitis with left-sided diverticulosis.  The patient reports that her bloody diarrhea has slowed down and she is having much less abdominal pain. The patient reports that she was having such bad diarrhea that she missed an appointment with her neurologist because of the diarrhea.  The patient reported that the diarrhea was first liquidy then became bloody.  She denies any recent antibiotic use while in the hospital or since she had been discharged.  The patient's history includes a colonoscopy in 2019 with a few small polyps that were removed and a repeat colonoscopy 1 year later in 2020 with a report of a 28 mm polyp and multiple other polyps removed.  The previous colonoscopy to the above-mentioned procedures was done in 2016 and reported diverticulosis throughout the colon.  This was not mentioned in either the 2019 or 2020 colonoscopy reports.  Past Medical History:  Diagnosis Date  .  Anemia   . Arthritis   . CAD (coronary artery disease)    a. cath 02/2006: BMS x 2 to RCA, cath o/w without significant coronary disease; b. nuclear stress test 07/2014: no signs of ischemia, no ekg changes concerning for ischemia, low risk study/normal study  . Chronic bronchitis ([place])    secondary to cigarette smoking  . Chronic kidney disease (CKD), stage III (moderate)   . COPD (chronic obstructive pulmonary disease) ([place])   . Diabetes mellitus   . FHx: allergies   . GERD (gastroesophageal reflux disease)   . Goiter   . 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 Hernia   . Hyperlipidemia   . Hypertension   . Kidney stone on left side 2013  . Microalbuminuria   . Obesity   . Smokers' cough ([place])   . Spinal stenosis   . Stroke ([place]) 10/29/2016   mild left side weakness  . Stroke [facility]) 08/01/2019    Past Surgical History:  Procedure Laterality Date  . ABDOMINAL HYSTERECTOMY    . BREAST SURGERY    . CATARACT EXTRACTION W/PHACO Right 03/01/2017   Procedure: CATARACT EXTRACTION PHACO AND INTRAOCULAR LENS PLACEMENT ([place]) RIGHT DIABETIC;  Surgeon: Leandrew Koyanagi, MD;  Location: [employer];  Service: Ophthalmology;  Laterality: Right;  . CATARACT EXTRACTION W/PHACO Left 03/22/2017   Procedure: CATARACT EXTRACTION PHACO AND INTRAOCULAR LENS PLACEMENT ([place]) LEFT DIABETIC;  Surgeon: Leandrew Koyanagi, MD;  Location: [facility];  Service: Ophthalmology;  Laterality: Left;  Diabetic - insulin and oral meds  . COLONOSCOPY WITH PROPOFOL N/A 09/23/2014   Procedure: COLONOSCOPY WITH PROPOFOL;  Surgeon: Lollie Sails, MD;  Location: [facility] ENDOSCOPY;  Service: Endoscopy;  Laterality: N/A;  . COLONOSCOPY WITH PROPOFOL N/A 01/11/2018   Procedure: COLONOSCOPY WITH PROPOFOL;  Surgeon: Lollie Sails, MD;  Location: [facility] ENDOSCOPY;  Service: Endoscopy;  Laterality: N/A;  . COLONOSCOPY WITH PROPOFOL N/A 04/24/2018   Procedure: COLONOSCOPY WITH PROPOFOL;  Surgeon: Lollie Sails,  MD;  Location: [facility] ENDOSCOPY;  Service: Endoscopy;  Laterality: N/A;  . CORONARY ANGIOPLASTY WITH STENT PLACEMENT  2008  . ESOPHAGOGASTRODUODENOSCOPY (EGD) WITH PROPOFOL N/A 12/30/2014   Procedure: ESOPHAGOGASTRODUODENOSCOPY (EGD) WITH PROPOFOL;  Surgeon: Lollie Sails, MD;  Location: [facility] ENDOSCOPY;  Service: Endoscopy;  Laterality: N/A;  . ESOPHAGOGASTRODUODENOSCOPY (EGD) WITH PROPOFOL N/A 07/19/2016   Procedure: ESOPHAGOGASTRODUODENOSCOPY (EGD) WITH PROPOFOL;  Surgeon: Lollie Sails, MD;  Location: [facility] ENDOSCOPY;  Service: Endoscopy;  Laterality: N/A;  . ESOPHAGOGASTRODUODENOSCOPY (EGD) WITH PROPOFOL N/A 04/24/2018   Procedure: ESOPHAGOGASTRODUODENOSCOPY (EGD) WITH PROPOFOL;  Surgeon: Lollie Sails, MD;  Location: [facility] ENDOSCOPY;  Service: Endoscopy;  Laterality: N/A;  . hysterectomy (other)      Prior to Admission medications   Medication Sig Start Date End Date Taking? Authorizing Provider  amLODipine (NORVASC) 5 MG tablet Take 1 tablet (5 mg total) by mouth daily. 12/31/18  Yes Minna Merritts, MD  aspirin (ASPIR-81) 81 MG EC tablet Take 81 mg by mouth daily.     Yes [provider]  clopidogrel (PLAVIX) 75 MG tablet Take 1 tablet (75 mg total) by mouth daily. 12/31/18  Yes Minna Merritts, MD  cyanocobalamin 1000 MCG tablet Take 1,000 mcg by mouth daily.   Yes [provider]  ezetimibe (ZETIA) 10 MG tablet Take 1 tablet (10 mg total) by mouth daily. 12/31/18  Yes Gollan, Kathlene November, MD  fluticasone furoate-vilanterol (BREO ELLIPTA) 100-25 MCG/INH AEPB Inhale 1 puff into the lungs daily. 09/07/18  Yes Hannah Sons, MD  HYDROcodone-acetaminophen (NORCO/VICODIN) 5-325 MG tablet Take 1 tablet by mouth every 6 (six) hours as needed. 07/09/19  Yes Hannah Sons, MD  Liraglutide (VICTOZA) 18 MG/3ML SOLN injection Inject 1.8 mg into the skin daily.    Yes [provider]  lisinopril (ZESTRIL) 20 MG tablet Take 1 tablet (20 mg total) by mouth daily.  12/31/18  Yes Minna Merritts, MD  metoprolol tartrate (LOPRESSOR) 100 MG tablet Take 1 tablet (100 mg total) by mouth 2 (two) times daily. 12/31/18  Yes Gollan, Kathlene November, MD  Omega-3 Fatty Acids (FISH OIL) 1000 MG CAPS Take 2 capsules by mouth in the morning and at bedtime.    Yes [provider]  RABEprazole (ACIPHEX) 20 MG tablet Take 20 mg by mouth daily. 15 Mins. before evening meal   Yes [provider]  rosuvastatin (CRESTOR) 10 MG tablet Take 1 tablet (10 mg total) by mouth daily. 08/06/19  Yes Minna Merritts, MD  tiotropium (SPIRIVA) 18 MCG inhalation capsule Place 1 capsule (18 mcg total) into inhaler and inhale daily. 06/11/19  Yes Hannah Sons, MD  acetaminophen (TYLENOL) 500 MG tablet Take 500 mg by mouth every 6 (six) hours as needed.    [provider]  albuterol (VENTOLIN HFA) 108 (90 Base) MCG/ACT inhaler Inhale 2 puffs into the lungs every 4 (four) hours as needed for wheezing or shortness of breath. 09/21/17   Fisher,  Kirstie Peri, MD  insulin glargine (LANTUS) 100 UNIT/ML injection Inject 22 Units into the skin daily.     [provider]  Multiple Vitamins-Minerals (DAILY MULTI) TABS Take 1 tablet by mouth daily.     [provider]  nitroGLYCERIN (NITROSTAT) 0.4 MG SL tablet Place 1 tablet (0.4 mg total) under the tongue every 5 (five) minutes as needed. 11/22/17   Minna Merritts, MD  ondansetron (ZOFRAN ODT) 4 MG disintegrating tablet Take 1 tablet (4 mg total) by mouth every 8 (eight) hours as needed for nausea or vomiting. 08/06/16   Alfred Levins, [place], MD  sucralfate (CARAFATE) 1 g tablet Take 1 g by mouth 2 (two) times daily. 15 Minutes before evening meal and at bed time    [provider]    Family History  Problem Relation Age of Onset  . Heart failure Mother   . Stroke Mother   . Diabetes Mother   . Congestive Heart Failure Mother   . Lung cancer Father   . Breast cancer Maternal Aunt      Social History    Tobacco Use  . Smoking status: Current Every Day Smoker    Packs/day: 0.75    Years: 40.00    Pack years: 30.00    Types: Cigarettes  . Smokeless tobacco: Never Used  Vaping Use  . Vaping Use: Former  Substance Use Topics  . Alcohol use: Yes    Comment: 1/month- wine  . Drug use: No    Allergies as of 08/07/2019 - Review Complete 08/07/2019  Allergen Reaction Noted  . Spironolactone Shortness Of Breath 10/05/2018  . Sulfonamide derivatives Other (See Comments)   . Other Other (See Comments) 04/27/2015  . Statins Other (See Comments) 10/04/2011  . Sulfa antibiotics Other (See Comments) 10/22/2013  . Codeine Nausea And Vomiting and Other (See Comments) 04/27/2015  . Prednisone Palpitations and Other (See Comments) 10/04/2011    Review of Systems:    All systems reviewed and negative except where noted in HPI.   Physical Exam:  Vital signs in last 24 hours: Temp:  [98.1 F (36.7 C)-98.6 F (37 C)] 98.1 F (36.7 C) (06/17 1704) Pulse Rate:  [71-93] 71 (06/17 1704) Resp:  [16-20] 16 (06/17 1704) BP: (146-185)/(76-103) 153/76 (06/17 1704) SpO2:  [87 %-100 %] 98 % (06/17 1704) Weight:  [78.9 kg] 78.9 kg (06/16 2225) Last BM Date: 08/08/19 General:   Pleasant, cooperative in NAD Head:  Normocephalic and atraumatic. Eyes:   No icterus.   Conjunctiva pink. PERRLA. Ears:  Normal auditory acuity. Neck:  Supple; no masses or thyroidomegaly Lungs: Respirations even and unlabored. Lungs clear to auscultation bilaterally.   No wheezes, crackles, or rhonchi.  Heart:  Regular rate and rhythm;  Without murmur, clicks, rubs or gallops Abdomen:  Soft, nondistended, nontender. Normal bowel sounds. No appreciable masses or hepatomegaly.  No rebound or guarding.  Rectal:  Not performed. Msk:  Symmetrical without gross deformities.  Stre  Extremities:  Without edema, cyanosis or clubbing. Neurologic:  Alert and oriented x3;  grossly normal neurologically. Skin:  Intact without  significant lesions or rashes. Cervical Nodes:  No significant cervical adenopathy. Psych:  Alert and cooperative. Normal affect.  LAB RESULTS: Recent Labs    08/07/19 2228 08/08/19 1019 08/08/19 1730  WBC 18.6* 20.3* 18.3*  HGB 17.7* 16.4* 16.1*  HCT 52.4* 48.6* 45.8  PLT 240 227 199   BMET Recent Labs    08/07/19 2228  NA 140  K 3.4*  CL  101  CO2 27  GLUCOSE 199*  BUN 24*  CREATININE 1.41*  CALCIUM 10.1   LFT Recent Labs    08/07/19 2228  PROT 7.7  ALBUMIN 4.1  AST 26  ALT 21  ALKPHOS 89  BILITOT 0.9   PT/INR Recent Labs    08/08/19 1019  LABPROT 12.4  INR 1.0    STUDIES: CT Abdomen Pelvis Wo Contrast  Result Date: 08/08/2019 CLINICAL DATA:  Diverticulitis, abdominal pain, GI bleed. EXAM: CT ABDOMEN AND PELVIS WITHOUT CONTRAST TECHNIQUE: Multidetector CT imaging of the abdomen and pelvis was performed following the standard protocol without IV contrast. COMPARISON:  10/17/2017 FINDINGS: Lower chest: Lung bases are clear. No effusions. Heart is normal size. Hepatobiliary: No focal hepatic abnormality. Gallbladder unremarkable. Pancreas: No focal abnormality or ductal dilatation. Spleen: No focal abnormality.  Normal size. Adrenals/Urinary Tract: Bilateral renal low-density lesions, likely cysts. Small nonobstructing stone in the midpole of the left kidney. No hydronephrosis. Adrenal glands and urinary bladder unremarkable. Stomach/Bowel: Descending colonic and sigmoid diverticulosis. There is circumferential wall thickening and stranding noted from the distal transverse colon to the mid sigmoid colon compatible with colitis. Stomach and small bowel decompressed, unremarkable. Vascular/Lymphatic: Heavily calcified aorta and iliac vessels. No aneurysm or adenopathy. Reproductive: Prior hysterectomy.  No adnexal masses. Other: No free fluid or free air. Musculoskeletal: No acute bony abnormality. IMPRESSION: Wall thickening and inflammation involving the colon from the  distal transverse colon to the mid sigmoid colon compatible with infectious or inflammatory colitis. Left colonic diverticulosis. Aortic atherosclerosis. Left nephrolithiasis. Electronically Signed   By: Rolm Baptise M.D.   On: 08/08/2019 03:02   DG Chest Portable 1 View  Result Date: 08/08/2019 CLINICAL DATA:  Short of breath EXAM: PORTABLE CHEST 1 VIEW COMPARISON:  07/24/2014 FINDINGS: Cardiac and mediastinal contours normal. Atherosclerotic aorta. Negative for heart failure. Mild apical scarring. No acute infiltrate effusion or mass. IMPRESSION: No active disease. Electronically Signed   By: Franchot Gallo M.D.   On: 08/08/2019 09:10      Impression / Plan:   Assessment: Principal Problem:   Acute hemorrhagic colitis Active Problems:   Tobacco use disorder   Coronary atherosclerosis   Acid reflux   Essential hypertension   Stage 3b chronic kidney disease   Stroke ([place])   Type II diabetes mellitus with renal manifestations ([place])   Hypokalemia   Hannah Kirby is a 74 y.o. y/o female with rectal bleeding and diarrhea.  The patient has a history of multiple polyps and diverticulosis reported on her 2016 colonoscopy but not on the 2019 or 2020 colonoscopies.  The patient has had a positive C. difficile PCR and toxin with an antigen negative.  The patient has been started on vancomycin and Zosyn.  The patient's hemoglobin is over 16 on all the labs I have dating back to August 2019.  Her admission hemoglobin was 17.7.  Her white cell count was 18.6 on admission and this morning was up to 20.3 and now is back down to 18.3.  Plan:  This patient has bloody diarrhea with findings consistent with C. difficile colitis in light of her diarrhea and leukocytosis.  The patient's abdominal pain is better.  I agree with treating the patient for her C. difficile.  There is no indication for any endoscopic procedures at this time.  If the patient does not improve with her symptoms then an infectious  disease consult should be considered for her infectious colitis.  The patient has been explained the plan and  agrees with it.  Thank you for involving me in the care of this patient.      LOS: 0 days   Hannah Lame, MD  08/08/2019, 6:08 PM Pager [phone] 7am-5pm  Check AMION for 5pm -7am coverage and on weekends   Note: This dictation was prepared with Dragon dictation along with smaller phrase technology. Any transcriptional errors that result from this process are unintentional.

## 2019-08-08 NOTE — H&P (Signed)
History and Physical    Hannah Kirby EVO:[id number] DOB: [birth year] DOA: 08/08/2019  Referring MD/NP/PA:   PCP: Birdie Sons, MD   Patient coming from:  The patient is coming from home.  At baseline, pt is independent for most of ADL.        Chief Complaint: Bloody diarrhea  HPI: Hannah Kirby is a 74 y.o. female with medical history significant of hypertension, hyperlipidemia, diabetes mellitus, COPD, stroke, GERD, kidney stone, CKD stage III, CAD, stent placement, AAA, tobacco abuse, who presents with bloody diarrhea.  Pt was recently admitted to hospital due to stroke on 6/10. Pt was discharged on aspirin and Plavix.  Patient states that she started having diarrhea since yesterday, which becomes bloody today.  She has had more than 10 times of diarrhea.  Patient has crampy abdominal pain, which is located in central abdomen, constant, moderate, sharp, nonradiating.  She has nausea, but no vomiting.  Denies fever or chills.  Patient has mild dry cough, but no shortness of breath, chest pain.  Denies symptoms of UTI.  ED Course: pt was found to have WBC 18.6, troponin 13, negative troponin, pending COVID-19 PCR, pending FOBT, stable renal function, potassium 3.4, temperature normal, blood pressure 185/103, 164/88, heart rate 78, oxygen saturation 92-100% on room air.  Chest x-ray negative.  C. difficile test positive for antigen and negative for toxin, with positive PCR. Pending GI pathogen panel.  Patient is placed on MedSurg bed for observation.  GI, Dr. Allen Norris is consulted.   CT of abdomen/pelvis showed: 1. Wall thickening and inflammation involving the colon from the distal transverse colon to the mid sigmoid colon compatible with infectious or inflammatory colitis. 2. Left colonic diverticulosis. 3. Aortic atherosclerosis. 4. Left nephrolithiasis.  Review of Systems:   General: no fevers, chills, no body weight gain, has poor appetite, has fatigue HEENT: no blurry vision,  hearing changes or sore throat Respiratory: no dyspnea, has coughing, no wheezing CV: no chest pain, no palpitations GI: has nausea, abdominal pain, bloody diarrhea, no constipation, vomiting, GU: no dysuria, burning on urination, increased urinary frequency, hematuria  Ext: no leg edema Neuro: no unilateral weakness, numbness, or tingling, no vision change or hearing loss Skin: no rash, no skin tear. MSK: No muscle spasm, no deformity, no limitation of range of movement in spin Heme: No easy bruising.  Travel history: No recent long distant travel.  Allergy:  Allergies  Allergen Reactions  . Spironolactone Shortness Of Breath  . Sulfonamide Derivatives Other (See Comments)    Last taken as a child; made her mouth break out  . Other Other (See Comments)    Mouth blisters  . Sulfa Antibiotics Other (See Comments)    Mouth blisters Welts on mouth   . Codeine Nausea And Vomiting and Other (See Comments)    Nausea  . Prednisone Palpitations and Other (See Comments)    Made her legs "feel weird."    Past Medical History:  Diagnosis Date  . Anemia   . Arthritis   . CAD (coronary artery disease)    a. cath 02/2006: BMS x 2 to RCA, cath o/w without significant coronary disease; b. nuclear stress test 07/2014: no signs of ischemia, no ekg changes concerning for ischemia, low risk study/normal study  . Chronic bronchitis ([place])    secondary to cigarette smoking  . Chronic kidney disease (CKD), stage III (moderate)   . COPD (chronic obstructive pulmonary disease) ([place])   . Diabetes mellitus   . FHx:  allergies   . GERD (gastroesophageal reflux disease)   . Goiter   . 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 Hernia   . Hyperlipidemia   . Hypertension   . Kidney stone on left side 2013  . Microalbuminuria   . Obesity   . Smokers' cough ([place])   . Spinal stenosis   . Stroke ([place]) 10/29/2016   mild left side weakness  . Stroke [facility]) 08/01/2019    Past Surgical History:  Procedure  Laterality Date  . ABDOMINAL HYSTERECTOMY    . BREAST SURGERY    . CATARACT EXTRACTION W/PHACO Right 03/01/2017   Procedure: CATARACT EXTRACTION PHACO AND INTRAOCULAR LENS PLACEMENT ([place]) RIGHT DIABETIC;  Surgeon: Leandrew Koyanagi, MD;  Location: [place];  Service: Ophthalmology;  Laterality: Right;  . CATARACT EXTRACTION W/PHACO Left 03/22/2017   Procedure: CATARACT EXTRACTION PHACO AND INTRAOCULAR LENS PLACEMENT ([place]) LEFT DIABETIC;  Surgeon: Leandrew Koyanagi, MD;  Location: [place];  Service: Ophthalmology;  Laterality: Left;  Diabetic - insulin and oral meds  . COLONOSCOPY WITH PROPOFOL N/A 09/23/2014   Procedure: COLONOSCOPY WITH PROPOFOL;  Surgeon: Lollie Sails, MD;  Location[facility] ENDOSCOPY;  Service: Endoscopy;  Laterality: N/A;  . COLONOSCOPY WITH PROPOFOL N/A 01/11/2018   Procedure: COLONOSCOPY WITH PROPOFOL;  Surgeon: Lollie Sails, MD;  Location: [facility] ENDOSCOPY;  Service: Endoscopy;  Laterality: N/A;  . COLONOSCOPY WITH PROPOFOL N/A 04/24/2018   Procedure: COLONOSCOPY WITH PROPOFOL;  Surgeon: Lollie Sails, MD;  Location: [facility] ENDOSCOPY;  Service: Endoscopy;  Laterality: N/A;  . CORONARY ANGIOPLASTY WITH STENT PLACEMENT  2008  . ESOPHAGOGASTRODUODENOSCOPY (EGD) WITH PROPOFOL N/A 12/30/2014   Procedure: ESOPHAGOGASTRODUODENOSCOPY (EGD) WITH PROPOFOL;  Surgeon: Lollie Sails, MD;  Location: [facility] ENDOSCOPY;  Service: Endoscopy;  Laterality: N/A;  . ESOPHAGOGASTRODUODENOSCOPY (EGD) WITH PROPOFOL N/A 07/19/2016   Procedure: ESOPHAGOGASTRODUODENOSCOPY (EGD) WITH PROPOFOL;  Surgeon: Lollie Sails, MD;  Location: [facility] ENDOSCOPY;  Service: Endoscopy;  Laterality: N/A;  . ESOPHAGOGASTRODUODENOSCOPY (EGD) WITH PROPOFOL N/A 04/24/2018   Procedure: ESOPHAGOGASTRODUODENOSCOPY (EGD) WITH PROPOFOL;  Surgeon: Lollie Sails, MD;  Location: [facility] ENDOSCOPY;  Service: Endoscopy;  Laterality: N/A;  . hysterectomy (other)      Social History:  reports  that she has been smoking cigarettes. She has a 30.00 pack-year smoking history. She has never used smokeless tobacco. She reports current alcohol use. She reports that she does not use drugs.  Family History:  Family History  Problem Relation Age of Onset  . Heart failure Mother   . Stroke Mother   . Diabetes Mother   . Congestive Heart Failure Mother   . Lung cancer Father   . Breast cancer Maternal Aunt      Prior to Admission medications   Medication Sig Start Date End Date Taking? Authorizing Provider  acetaminophen (TYLENOL) 500 MG tablet Take 500 mg by mouth every 6 (six) hours as needed.    [provider]  albuterol (VENTOLIN HFA) 108 (90 Base) MCG/ACT inhaler Inhale 2 puffs into the lungs every 4 (four) hours as needed for wheezing or shortness of breath. 09/21/17   Birdie Sons, MD  amLODipine (NORVASC) 5 MG tablet Take 1 tablet (5 mg total) by mouth daily. 12/31/18   Minna Merritts, MD  aspirin (ASPIR-81) 81 MG EC tablet Take 81 mg by mouth daily.      [provider]  clopidogrel (PLAVIX) 75 MG tablet Take 1 tablet (75 mg total) by mouth daily. 12/31/18   Minna Merritts, MD  cyanocobalamin 1000 MCG tablet Take 1,000 mcg by mouth daily.    [provider]  ezetimibe (ZETIA) 10 MG tablet Take 1 tablet (10 mg total) by mouth daily. 12/31/18   Minna Merritts, MD  fluticasone furoate-vilanterol (BREO ELLIPTA) 100-25 MCG/INH AEPB Inhale 1 puff into the lungs daily. 09/07/18   Birdie Sons, MD  HYDROcodone-acetaminophen (NORCO/VICODIN) 5-325 MG tablet Take 1 tablet by mouth every 6 (six) hours as needed. 07/09/19   Birdie Sons, MD  insulin glargine (LANTUS) 100 UNIT/ML injection Inject 18 Units into the skin daily.     [provider]  Liraglutide (VICTOZA) 18 MG/3ML SOLN injection Inject 1.8 mg into the skin daily.     [provider]  lisinopril (ZESTRIL) 20 MG tablet Take 1 tablet (20 mg total) by mouth daily. 12/31/18    Minna Merritts, MD  metoprolol tartrate (LOPRESSOR) 100 MG tablet Take 1 tablet (100 mg total) by mouth 2 (two) times daily. 12/31/18   Minna Merritts, MD  Multiple Vitamins-Minerals (DAILY MULTI) TABS Take 1 tablet by mouth daily.     [provider]  nitroGLYCERIN (NITROSTAT) 0.4 MG SL tablet Place 1 tablet (0.4 mg total) under the tongue every 5 (five) minutes as needed. 11/22/17   Minna Merritts, MD  Omega-3 Fatty Acids (FISH OIL) 1000 MG CAPS Take 2 capsules by mouth in the morning and at bedtime.     [provider]  ondansetron (ZOFRAN ODT) 4 MG disintegrating tablet Take 1 tablet (4 mg total) by mouth every 8 (eight) hours as needed for nausea or vomiting. 08/06/16   Alfred Levins, [place], MD  RABEprazole (ACIPHEX) 20 MG tablet Take 20 mg by mouth daily. 15 Mins. before evening meal    [provider]  rosuvastatin (CRESTOR) 10 MG tablet Take 1 tablet (10 mg total) by mouth daily. 08/06/19   Minna Merritts, MD  sucralfate (CARAFATE) 1 g tablet Take 1 g by mouth 2 (two) times daily. 15 Minutes before evening meal and at bed time    [provider]  tiotropium (SPIRIVA) 18 MCG inhalation capsule Place 1 capsule (18 mcg total) into inhaler and inhale daily. 06/11/19   Birdie Sons, MD    Physical Exam: Vitals:   08/08/19 0912 08/08/19 0913 08/08/19 0914 08/08/19 0915  BP:      Pulse: 82 80 81 81  Resp:      Temp:      TempSrc:      SpO2: 97% 97% 97% 97%  Weight:      Height:       General: Not in acute distress HEENT:       Eyes: PERRL, EOMI, no scleral icterus.       ENT: No discharge from the ears and nose, no pharynx injection, no tonsillar enlargement.        Neck: No JVD, no bruit, no mass felt. Heme: No neck lymph node enlargement. Cardiac: S1/S2, RRR, No murmurs, No gallops or rubs. Respiratory:  No rales, wheezing, rhonchi or rubs. GI: Soft, nondistended, has tenderness in central abdomen, no rebound pain, no organomegaly, BS  present. GU: No hematuria Ext: No pitting leg edema bilaterally. 2+DP/PT pulse bilaterally. Musculoskeletal: No joint deformities, No joint redness or warmth, no limitation of ROM in spin. Skin: No rashes.  Neuro: Alert, oriented X3, cranial nerves II-XII grossly intact, moves all extremities normally.  Psych: Patient is not psychotic, no suicidal or hemocidal ideation.  Labs on Admission: I  have personally reviewed following labs and imaging studies  CBC: Recent Labs  Lab 08/01/19 1215 08/07/19 2228 08/08/19 1019  WBC 8.8 18.6* 20.3*  NEUTROABS 5.2  --   --   HGB 16.6* 17.7* 16.4*  HCT 49.7* 52.4* 48.6*  MCV 96.9 95.8 96.4  PLT 214 240 426   Basic Metabolic Panel: Recent Labs  Lab 08/01/19 1215 08/07/19 2228  NA 141 140  K 3.5 3.4*  CL 104 101  CO2 28 27  GLUCOSE 187* 199*  BUN 27* 24*  CREATININE 1.68* 1.41*  CALCIUM 9.7 10.1   GFR: Estimated Creatinine Clearance: 36.9 mL/min (A) (by C-G formula based on SCr of 1.41 mg/dL (H)). Liver Function Tests: Recent Labs  Lab 08/01/19 1215 08/07/19 2228  AST 19 26  ALT 21 21  ALKPHOS 100 89  BILITOT 0.6 0.9  PROT 7.4 7.7  ALBUMIN 3.8 4.1   Recent Labs  Lab 08/07/19 2228  LIPASE 31   No results for input(s): AMMONIA in the last 168 hours. Coagulation Profile: Recent Labs  Lab 08/01/19 1215 08/08/19 1019  INR 1.0 1.0   Cardiac Enzymes: No results for input(s): CKTOTAL, CKMB, CKMBINDEX, TROPONINI in the last 168 hours. BNP (last 3 results) No results for input(s): PROBNP in the last 8760 hours. HbA1C: No results for input(s): HGBA1C in the last 72 hours. CBG: Recent Labs  Lab 08/01/19 1158  GLUCAP 192*   Lipid Profile: No results for input(s): CHOL, HDL, LDLCALC, TRIG, CHOLHDL, LDLDIRECT in the last 72 hours. Thyroid Function Tests: No results for input(s): TSH, T4TOTAL, FREET4, T3FREE, THYROIDAB in the last 72 hours. Anemia Panel: No results for input(s): VITAMINB12, FOLATE, FERRITIN, TIBC, IRON,  RETICCTPCT in the last 72 hours. Urine analysis:    Component Value Date/Time   COLORURINE YELLOW (A) 08/07/2019 2228   APPEARANCEUR HAZY (A) 08/07/2019 2228   LABSPEC 1.022 08/07/2019 2228   PHURINE 5.0 08/07/2019 2228   GLUCOSEU NEGATIVE 08/07/2019 2228   HGBUR SMALL (A) 08/07/2019 2228   BILIRUBINUR NEGATIVE 08/07/2019 2228   KETONESUR NEGATIVE 08/07/2019 2228   PROTEINUR >=300 (A) 08/07/2019 2228   NITRITE NEGATIVE 08/07/2019 2228   LEUKOCYTESUR NEGATIVE 08/07/2019 2228   Sepsis Labs: @LABRCNTIP (procalcitonin:4,lacticidven:4) ) Recent Results (from the past 240 hour(s))  SARS Coronavirus 2 by RT PCR (hospital order, performed in [place] hospital lab) Nasopharyngeal Nasopharyngeal Swab     Status: None   Collection Time: 08/01/19 12:24 PM   Specimen: Nasopharyngeal Swab  Result Value Ref Range Status   SARS Coronavirus 2 NEGATIVE NEGATIVE Final    Comment: (NOTE) SARS-CoV-2 target nucleic acids are NOT DETECTED.  The SARS-CoV-2 RNA is generally detectable in upper and lower respiratory specimens during the acute phase of infection. The lowest concentration of SARS-CoV-2 viral copies this assay can detect is 250 copies / mL. A negative result does not preclude SARS-CoV-2 infection and should not be used as the sole basis for treatment or other patient management decisions.  A negative result may occur with improper specimen collection / handling, submission of specimen other than nasopharyngeal swab, presence of viral mutation(s) within the areas targeted by this assay, and inadequate number of viral copies (<250 copies / mL). A negative result must be combined with clinical observations, patient history, and epidemiological information.  Fact Sheet for Patients:   StrictlyIdeas.no  Fact Sheet for Healthcare Providers: BankingDealers.co.za  This test is not yet approved or  cleared by the [place] FDA and has been  authorized for detection and/or  diagnosis of SARS-CoV-2 by FDA under an Emergency Use Authorization (EUA).  This EUA will remain in effect (meaning this test can be used) for the duration of the COVID-19 declaration under Section 564(b)(1) of the Act, 21 U.S.C. section 360bbb-3(b)(1), unless the authorization is terminated or revoked sooner.  Performed at [facility], [address] [place], [place] [address]   Gastrointestinal Panel by PCR , Stool     Status: None   Collection Time: 08/08/19  8:23 AM   Specimen: Stool  Result Value Ref Range Status   Campylobacter species NOT DETECTED NOT DETECTED Final   Plesimonas shigelloides NOT DETECTED NOT DETECTED Final   Salmonella species NOT DETECTED NOT DETECTED Final   Yersinia enterocolitica NOT DETECTED NOT DETECTED Final   Vibrio species NOT DETECTED NOT DETECTED Final   Vibrio cholerae NOT DETECTED NOT DETECTED Final   Enteroaggregative E coli (EAEC) NOT DETECTED NOT DETECTED Final   Enteropathogenic E coli (EPEC) NOT DETECTED NOT DETECTED Final   Enterotoxigenic E coli (ETEC) NOT DETECTED NOT DETECTED Final   Shiga like toxin producing E coli (STEC) NOT DETECTED NOT DETECTED Final   Shigella/Enteroinvasive E coli (EIEC) NOT DETECTED NOT DETECTED Final   Cryptosporidium NOT DETECTED NOT DETECTED Final   Cyclospora cayetanensis NOT DETECTED NOT DETECTED Final   Entamoeba histolytica NOT DETECTED NOT DETECTED Final   Giardia lamblia NOT DETECTED NOT DETECTED Final   Adenovirus F40/41 NOT DETECTED NOT DETECTED Final   Astrovirus NOT DETECTED NOT DETECTED Final   Norovirus GI/GII NOT DETECTED NOT DETECTED Final   Rotavirus A NOT DETECTED NOT DETECTED Final   Sapovirus (I, II, IV, and V) NOT DETECTED NOT DETECTED Final    Comment: Performed at [facility], [address] [place], [place] [address]  C Difficile Quick Screen w PCR reflex     Status: Abnormal   Collection Time: 08/08/19  8:23 AM   Specimen: Stool    Result Value Ref Range Status   C Diff antigen POSITIVE (A) NEGATIVE Final   C Diff toxin NEGATIVE NEGATIVE Final   C Diff interpretation Results are indeterminate. See PCR results.  Final    Comment: Performed at [facility], [address] [place], [place] [address]     Radiological Exams on Admission: CT Abdomen Pelvis Wo Contrast  Result Date: 08/08/2019 CLINICAL DATA:  Diverticulitis, abdominal pain, GI bleed. EXAM: CT ABDOMEN AND PELVIS WITHOUT CONTRAST TECHNIQUE: Multidetector CT imaging of the abdomen and pelvis was performed following the standard protocol without IV contrast. COMPARISON:  10/17/2017 FINDINGS: Lower chest: Lung bases are clear. No effusions. Heart is normal size. Hepatobiliary: No focal hepatic abnormality. Gallbladder unremarkable. Pancreas: No focal abnormality or ductal dilatation. Spleen: No focal abnormality.  Normal size. Adrenals/Urinary Tract: Bilateral renal low-density lesions, likely cysts. Small nonobstructing stone in the midpole of the left kidney. No hydronephrosis. Adrenal glands and urinary bladder unremarkable. Stomach/Bowel: Descending colonic and sigmoid diverticulosis. There is circumferential wall thickening and stranding noted from the distal transverse colon to the mid sigmoid colon compatible with colitis. Stomach and small bowel decompressed, unremarkable. Vascular/Lymphatic: Heavily calcified aorta and iliac vessels. No aneurysm or adenopathy. Reproductive: Prior hysterectomy.  No adnexal masses. Other: No free fluid or free air. Musculoskeletal: No acute bony abnormality. IMPRESSION: Wall thickening and inflammation involving the colon from the distal transverse colon to the mid sigmoid colon compatible with infectious or inflammatory colitis. Left colonic diverticulosis. Aortic atherosclerosis. Left nephrolithiasis. Electronically Signed   By: Rolm Baptise  M.D.   On: 08/08/2019 03:02   DG Chest Portable 1 View  Result Date:  08/08/2019 CLINICAL DATA:  Short of breath EXAM: PORTABLE CHEST 1 VIEW COMPARISON:  07/24/2014 FINDINGS: Cardiac and mediastinal contours normal. Atherosclerotic aorta. Negative for heart failure. Mild apical scarring. No acute infiltrate effusion or mass. IMPRESSION: No active disease. Electronically Signed   By: Franchot Gallo M.D.   On: 08/08/2019 09:10     EKG: Independently reviewed.  Sinus rhythm, QTC 467, LAE, LAD, poor R wave progression, nonspecific T wave change  Assessment/Plan Principal Problem:   Acute hemorrhagic colitis Active Problems:   Tobacco use disorder   Coronary atherosclerosis   Acid reflux   Essential hypertension   Stage 3b chronic kidney disease   Stroke (HCC)   Type II diabetes mellitus with renal manifestations (HCC)   Hypokalemia   Acute hemorrhagic colitis due to C. difficile colitis: Hemoglobin 17.7 --> 16.4. C. difficile test positive for antigen and negative for toxin, with positive PCR. GI, Dr. Allen Norris is consulted.  -Placed on MedSurg bed for observation -Start vancomycin per pharmacy dosing -D/C Zosyn which was started earlier -As needed morphine for pain, Zofran for nausea -IV fluid: 75 cc/h of normal saline -f/u CBC q8h  Tobacco use disorder -nicotine patch  Coronary atherosclerosis: s/p of stent. No CP -hold aspirin, Plavix -Continue Crestor and Zetia  Acid reflux -Protonix  HTN:  -Continue home medications: Amlodipine lisinopril, metoprolol, -hydralazine prn  Stage 3b chronic kidney disease: stable -f/u by BMP  Hx of stroke (HCC) -hold aspirin, Plavix -Continue Crestor and Zetia   Type II diabetes mellitus with renal manifestations ([place]): Most recent A1c 8.0, poorly controled. Patient is taking Victoza, Lantus at home -will decrease Lantus dose from 22-15 units daily  -SSI  Hypokalemia: K= 3.4  on admission. - Repleted - Check Mg level   DVT ppx: SCD Code Status: Full code Family Communication:    Yes, patient's sister  at bed side Disposition Plan:  Anticipate discharge back to previous environment Consults called:  GI, Dr. Allen Norris is consulted. Admission status: Med-surg bed for obs     Status is: Observation  The patient remains OBS appropriate and will d/c before 2 midnights.  Dispo: The patient is from: Home              Anticipated d/c is to: Home              Anticipated d/c date is: 1 day              Patient currently is not medically stable to d/c.           Date of Service 08/08/2019    Ivor Costa [facility]   If 7PM-7AM, please contact night-coverage [email or web address] 08/08/2019, 11:20 AM

## 2019-08-08 NOTE — ED Notes (Signed)
Patient room air sat 87%. Placed on 2L with increase to 97%.

## 2019-08-08 NOTE — Progress Notes (Signed)
Brief Pharmacy Note  Pharmacy consulted for vancomycin for C.diff infection. Confirmed with provider that there is no concern for infection requiring Flagyl and/or vancomycin enemas. Order for vancomycin 125 mg PO four times daily x 10 days.  Dorena Bodo, PharmD

## 2019-08-08 NOTE — ED Provider Notes (Addendum)
[facility] Emergency Department Provider Note  ____________________________________________   First MD Initiated Contact with Patient 08/08/19 [phone]     (approximate)  I have reviewed the triage vital signs and the nursing notes.   HISTORY  Chief Complaint Diarrhea and Rectal Bleeding    HPI Hannah Kirby is a 74 y.o. female  With PMHx HTN, HLD, recent CVA, CAD, here with abd pain, nausea, diarrhea. Pt was just recently hospitalized following stroke, had been recovering at home. Over the past 2 days however, she has developed diffuse abd cramping, pain, and diarrhea. Diarrhea has been initially liquid and now bloody, with associated diffuse abd cramping that is aching. No specific alleviating or aggravating factors. She's had associated nausea and fatigue, with chills. No known fevers. Has not been able to eat. No known recent ABX use. No recent sick contacts although she was just hospitalized, as mentioned.        Past Medical History:  Diagnosis Date  . Anemia   . Arthritis   . CAD (coronary artery disease)    a. cath 02/2006: BMS x 2 to RCA, cath o/w without significant coronary disease; b. nuclear stress test 07/2014: no signs of ischemia, no ekg changes concerning for ischemia, low risk study/normal study  . Chronic bronchitis ([place])    secondary to cigarette smoking  . Chronic kidney disease (CKD), stage III (moderate)   . COPD (chronic obstructive pulmonary disease) ([place])   . Diabetes mellitus   . FHx: allergies   . GERD (gastroesophageal reflux disease)   . Goiter   . Granulomatous disease ([place])   . Hernia   . Hyperlipidemia   . Hypertension   . Kidney stone on left side 2013  . Microalbuminuria   . Obesity   . Smokers' cough ([place])   . Spinal stenosis   . Stroke ([place]) 10/29/2016   mild left side weakness  . Stroke [facility]) 08/01/2019    Patient Active Problem List   Diagnosis Date Noted  . TIA (transient ischemic attack) 08/01/2019    . Benign hypertensive kidney disease with chronic kidney disease 04/25/2019  . Type 2 diabetes mellitus with diabetic chronic kidney disease ([place]) 04/25/2019  . Secondary hyperparathyroidism of renal origin ([place]) 10/24/2018  . Chronic, continuous use of opioids 03/26/2018  . History of adenomatous polyp of colon 01/22/2018  . Ulceration of colon determined by endoscopy 01/22/2018  . Diverticulosis of colon 01/22/2018  . Stroke ([place]) 11/08/2016  . Weakness of left upper extremity 10/30/2016  . AAA (abdominal aortic aneurysm) without rupture ([place]) 08/12/2016  . 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 Stage 3b chronic kidney disease 07/27/2015  . Chest pain at rest 05/06/2015  . 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[place]) 10/28/2014  . Solitary pulmonary nodule on lung CT 08/20/2014  . Allergic rhinitis 07/28/2014  . CAFL (chronic airflow limitation) ([place]) 07/28/2014  . Abnormal liver enzymes 07/28/2014  . Acid reflux 07/28/2014  . Obesity   . Granuloma annulare   . Back pain 11/12/2013  . Scoliosis 10/30/2013  . Lumbar radiculopathy 10/30/2013  . Lumbar canal stenosis 10/30/2013  . Calcium blood increased 10/22/2013  . Proteinuria 10/22/2013  . Tobacco use disorder 03/21/2011  . Obese 03/21/2011  . Edema 08/18/2010  . 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 Coronary atherosclerosis 03/25/2009  . Essential hypertension 03/25/2009    Past Surgical History:  Procedure Laterality Date  . ABDOMINAL HYSTERECTOMY    . BREAST SURGERY    . CATARACT EXTRACTION W/PHACO  Right 03/01/2017   Procedure: CATARACT EXTRACTION PHACO AND INTRAOCULAR LENS PLACEMENT ([place]) RIGHT DIABETIC;  Surgeon: Leandrew Koyanagi, MD;  Location: [employer];  Service: Ophthalmology;  Laterality: Right;  . CATARACT EXTRACTION W/PHACO Left 03/22/2017   Procedure: CATARACT EXTRACTION PHACO AND INTRAOCULAR LENS PLACEMENT ([place]) LEFT DIABETIC;  Surgeon: Leandrew Koyanagi, MD;   Location: [place];  Service: Ophthalmology;  Laterality: Left;  Diabetic - insulin and oral meds  . COLONOSCOPY WITH PROPOFOL N/A 09/23/2014   Procedure: COLONOSCOPY WITH PROPOFOL;  Surgeon: Lollie Sails, MD;  Location[facility] ENDOSCOPY;  Service: Endoscopy;  Laterality: N/A;  . COLONOSCOPY WITH PROPOFOL N/A 01/11/2018   Procedure: COLONOSCOPY WITH PROPOFOL;  Surgeon: Lollie Sails, MD;  Location: [facility] ENDOSCOPY;  Service: Endoscopy;  Laterality: N/A;  . COLONOSCOPY WITH PROPOFOL N/A 04/24/2018   Procedure: COLONOSCOPY WITH PROPOFOL;  Surgeon: Lollie Sails, MD;  Location: [facility] ENDOSCOPY;  Service: Endoscopy;  Laterality: N/A;  . CORONARY ANGIOPLASTY WITH STENT PLACEMENT  2008  . ESOPHAGOGASTRODUODENOSCOPY (EGD) WITH PROPOFOL N/A 12/30/2014   Procedure: ESOPHAGOGASTRODUODENOSCOPY (EGD) WITH PROPOFOL;  Surgeon: Lollie Sails, MD;  Location: [facility] ENDOSCOPY;  Service: Endoscopy;  Laterality: N/A;  . ESOPHAGOGASTRODUODENOSCOPY (EGD) WITH PROPOFOL N/A 07/19/2016   Procedure: ESOPHAGOGASTRODUODENOSCOPY (EGD) WITH PROPOFOL;  Surgeon: Lollie Sails, MD;  Location[facility] ENDOSCOPY;  Service: Endoscopy;  Laterality: N/A;  . ESOPHAGOGASTRODUODENOSCOPY (EGD) WITH PROPOFOL N/A 04/24/2018   Procedure: ESOPHAGOGASTRODUODENOSCOPY (EGD) WITH PROPOFOL;  Surgeon: Lollie Sails, MD;  Location: [facility] ENDOSCOPY;  Service: Endoscopy;  Laterality: N/A;  . hysterectomy (other)      Prior to Admission medications   Medication Sig Start Date End Date Taking? Authorizing Provider  acetaminophen (TYLENOL) 500 MG tablet Take 500 mg by mouth every 6 (six) hours as needed.    [provider]  albuterol (VENTOLIN HFA) 108 (90 Base) MCG/ACT inhaler Inhale 2 puffs into the lungs every 4 (four) hours as needed for wheezing or shortness of breath. 09/21/17   Birdie Sons, MD  amLODipine (NORVASC) 5 MG tablet Take 1 tablet (5 mg total) by mouth daily. 12/31/18   Minna Merritts, MD  aspirin  (ASPIR-81) 81 MG EC tablet Take 81 mg by mouth daily.      [provider]  clopidogrel (PLAVIX) 75 MG tablet Take 1 tablet (75 mg total) by mouth daily. 12/31/18   Minna Merritts, MD  cyanocobalamin 1000 MCG tablet Take 1,000 mcg by mouth daily.    [provider]  ezetimibe (ZETIA) 10 MG tablet Take 1 tablet (10 mg total) by mouth daily. 12/31/18   Minna Merritts, MD  fluticasone furoate-vilanterol (BREO ELLIPTA) 100-25 MCG/INH AEPB Inhale 1 puff into the lungs daily. 09/07/18   Birdie Sons, MD  HYDROcodone-acetaminophen (NORCO/VICODIN) 5-325 MG tablet Take 1 tablet by mouth every 6 (six) hours as needed. 07/09/19   Birdie Sons, MD  insulin glargine (LANTUS) 100 UNIT/ML injection Inject 18 Units into the skin daily.     [provider]  Liraglutide (VICTOZA) 18 MG/3ML SOLN injection Inject 1.8 mg into the skin daily.     [provider]  lisinopril (ZESTRIL) 20 MG tablet Take 1 tablet (20 mg total) by mouth daily. 12/31/18   Minna Merritts, MD  metoprolol tartrate (LOPRESSOR) 100 MG tablet Take 1 tablet (100 mg total) by mouth 2 (two) times daily. 12/31/18   Minna Merritts, MD  Multiple Vitamins-Minerals (DAILY MULTI) TABS Take 1 tablet by mouth daily.  [provider]  nitroGLYCERIN (NITROSTAT) 0.4 MG SL tablet Place 1 tablet (0.4 mg total) under the tongue every 5 (five) minutes as needed. 11/22/17   Minna Merritts, MD  Omega-3 Fatty Acids (FISH OIL) 1000 MG CAPS Take 2 capsules by mouth in the morning and at bedtime.     [provider]  ondansetron (ZOFRAN ODT) 4 MG disintegrating tablet Take 1 tablet (4 mg total) by mouth every 8 (eight) hours as needed for nausea or vomiting. 08/06/16   Alfred Levins, [place], MD  RABEprazole (ACIPHEX) 20 MG tablet Take 20 mg by mouth daily. 15 Mins. before evening meal    [provider]  rosuvastatin (CRESTOR) 10 MG tablet Take 1 tablet (10 mg total) by mouth daily. 08/06/19    Minna Merritts, MD  sucralfate (CARAFATE) 1 g tablet Take 1 g by mouth 2 (two) times daily. 15 Minutes before evening meal and at bed time    [provider]  tiotropium (SPIRIVA) 18 MCG inhalation capsule Place 1 capsule (18 mcg total) into inhaler and inhale daily. 06/11/19   Birdie Sons, MD    Allergies Spironolactone, Sulfonamide derivatives, Other, Statins, Sulfa antibiotics, Codeine, and Prednisone  Family History  Problem Relation Age of Onset  . Heart failure Mother   . Stroke Mother   . Diabetes Mother   . Congestive Heart Failure Mother   . Lung cancer Father   . Breast cancer Maternal Aunt     Social History Social History   Tobacco Use  . Smoking status: Current Every Day Smoker    Packs/day: 0.75    Years: 40.00    Pack years: 30.00    Types: Cigarettes  . Smokeless tobacco: Never Used  Vaping Use  . Vaping Use: Former  Substance Use Topics  . Alcohol use: Yes    Comment: 1/month- wine  . Drug use: No    Review of Systems  Review of Systems  Constitutional: Positive for chills and fatigue. Negative for fever.  HENT: Negative for congestion and sore throat.   Eyes: Negative for visual disturbance.  Respiratory: Negative for cough and shortness of breath.   Cardiovascular: Negative for chest pain.  Gastrointestinal: Positive for abdominal pain, diarrhea and nausea. Negative for vomiting.  Genitourinary: Negative for flank pain.  Musculoskeletal: Negative for back pain and neck pain.  Skin: Negative for rash and wound.  Neurological: Positive for weakness.  All other systems reviewed and are negative.    ____________________________________________  PHYSICAL EXAM:      VITAL SIGNS: ED Triage Vitals  Enc Vitals Group     BP 08/07/19 2224 (!) 185/103     Pulse Rate 08/07/19 2224 86     Resp 08/07/19 2226 20     Temp 08/07/19 2224 98.6 F (37 C)     Temp Source 08/07/19 2224 Oral     SpO2 08/07/19 2224 92 %     Weight 08/07/19  2225 174 lb (78.9 kg)     Height 08/07/19 2225 5\' 5"  (1.651 m)     Head Circumference --      Peak Flow --      Pain Score 08/07/19 2225 9     Pain Loc --      Pain Edu? --      Excl. in [place]? --      Physical Exam Vitals and nursing note reviewed.  Constitutional:      General: She is not in acute distress.  Appearance: She is well-developed. She is ill-appearing.  HENT:     Head: Normocephalic and atraumatic.     Mouth/Throat:     Mouth: Mucous membranes are dry.  Eyes:     Conjunctiva/sclera: Conjunctivae normal.  Cardiovascular:     Rate and Rhythm: Normal rate and regular rhythm.     Heart sounds: Normal heart sounds. No murmur heard.  No friction rub.  Pulmonary:     Effort: Pulmonary effort is normal. No respiratory distress.     Breath sounds: Normal breath sounds. No wheezing or rales.  Abdominal:     General: Abdomen is flat. There is no distension.     Palpations: Abdomen is soft.     Tenderness: There is abdominal tenderness.     Comments: Generalized, no rebound or guarding  Musculoskeletal:     Cervical back: Neck supple.  Skin:    General: Skin is warm.     Capillary Refill: Capillary refill takes less than 2 seconds.     Comments: Round, psoriatic lesions  Neurological:     Mental Status: She is alert and oriented to person, place, and time.     Motor: No abnormal muscle tone.       ____________________________________________   LABS (all labs ordered are listed, but only abnormal results are displayed)  Labs Reviewed  COMPREHENSIVE METABOLIC PANEL - Abnormal; Notable for the following components:      Result Value   Potassium 3.4 (*)    Glucose, Bld 199 (*)    BUN 24 (*)    Creatinine, Ser 1.41 (*)    GFR calc non Af Amer 37 (*)    GFR calc Af Amer 43 (*)    All other components within normal limits  CBC - Abnormal; Notable for the following components:   WBC 18.6 (*)    RBC 5.47 (*)    Hemoglobin 17.7 (*)    HCT 52.4 (*)    All  other components within normal limits  URINALYSIS, COMPLETE (UACMP) WITH MICROSCOPIC - Abnormal; Notable for the following components:   Color, Urine YELLOW (*)    APPearance HAZY (*)    Hgb urine dipstick SMALL (*)    Protein, ur >=300 (*)    All other components within normal limits  OCCULT BLOOD X 1 CARD TO LAB, STOOL - Abnormal; Notable for the following components:   Fecal Occult Bld POSITIVE (*)    All other components within normal limits  CULTURE, BLOOD (ROUTINE X 2)  CULTURE, BLOOD (ROUTINE X 2)  GASTROINTESTINAL PANEL BY PCR, STOOL (REPLACES STOOL CULTURE)  C DIFFICILE QUICK SCREEN W PCR REFLEX  SARS CORONAVIRUS 2 BY RT PCR (HOSPITAL ORDER, [place] [facility])  LIPASE, BLOOD  POC OCCULT BLOOD, ED  TYPE AND SCREEN  TROPONIN I (HIGH SENSITIVITY)    ____________________________________________  EKG: Normal sinus rhythm, VR 88. PR 176, QRS 98, QTc 467. No acute ST elevations or depressions. Subtle ST changes in I, aVL without ST elevations. ________________________________________  RADIOLOGY All imaging, including plain films, CT scans, and ultrasounds, independently reviewed by me, and interpretations confirmed via formal radiology reads.  ED MD interpretation:   CT A/P: Diffuse colitis, no abscess  Official radiology report(s): CT Abdomen Pelvis Wo Contrast  Result Date: 08/08/2019 CLINICAL DATA:  Diverticulitis, abdominal pain, GI bleed. EXAM: CT ABDOMEN AND PELVIS WITHOUT CONTRAST TECHNIQUE: Multidetector CT imaging of the abdomen and pelvis was performed following the standard protocol without IV contrast. COMPARISON:  10/17/2017  FINDINGS: Lower chest: Lung bases are clear. No effusions. Heart is normal size. Hepatobiliary: No focal hepatic abnormality. Gallbladder unremarkable. Pancreas: No focal abnormality or ductal dilatation. Spleen: No focal abnormality.  Normal size. Adrenals/Urinary Tract: Bilateral renal low-density lesions, likely cysts.  Small nonobstructing stone in the midpole of the left kidney. No hydronephrosis. Adrenal glands and urinary bladder unremarkable. Stomach/Bowel: Descending colonic and sigmoid diverticulosis. There is circumferential wall thickening and stranding noted from the distal transverse colon to the mid sigmoid colon compatible with colitis. Stomach and small bowel decompressed, unremarkable. Vascular/Lymphatic: Heavily calcified aorta and iliac vessels. No aneurysm or adenopathy. Reproductive: Prior hysterectomy.  No adnexal masses. Other: No free fluid or free air. Musculoskeletal: No acute bony abnormality. IMPRESSION: Wall thickening and inflammation involving the colon from the distal transverse colon to the mid sigmoid colon compatible with infectious or inflammatory colitis. Left colonic diverticulosis. Aortic atherosclerosis. Left nephrolithiasis. Electronically Signed   By: Rolm Baptise M.D.   On: 08/08/2019 03:02    ____________________________________________  PROCEDURES   Procedure(s) performed (including Critical Care):  .1-3 Lead EKG Interpretation Performed by: Duffy Bruce, MD Authorized by: Duffy Bruce, MD     Interpretation: normal     ECG rate:  60-80   ECG rate assessment: normal     Rhythm: sinus rhythm     Ectopy: none     Conduction: normal   Comments:     Indication: Sepsis, diarrhea    ____________________________________________  INITIAL IMPRESSION / MDM / ASSESSMENT AND PLAN / ED COURSE  As part of my medical decision making, I reviewed the following data within the [facility] notes reviewed and incorporated, Old chart reviewed, Notes from prior ED visits, and [place] Controlled Substance Database       *Taeja Debellis Gruen was evaluated in Emergency Department on 08/08/2019 for the symptoms described in the history of present illness. She was evaluated in the context of the global COVID-19 pandemic, which necessitated consideration that the  patient might be at risk for infection with the SARS-CoV-2 virus that causes COVID-19. Institutional protocols and algorithms that pertain to the evaluation of patients at risk for COVID-19 are in a state of rapid change based on information released by regulatory bodies including the CDC and federal and state organizations. These policies and algorithms were followed during the patient's care in the ED.  Some ED evaluations and interventions may be delayed as a result of limited staffing during the pandemic.*     Medical Decision Making:  74 yo F here with generalized weakness, abdominal cramping, and diarrhea. Labs, imaging from triage reviewed. Pt with significant leukocytosis, likely mild dehydration and CT scan c/f diffuse colitis. She was just hospitalized, raising concern for C. Diff though no recent ABX use. No severe pain or signs of bowel ischemia. Will start on Zosyn given her age/comorbidities and leukocytosis, start IVF, and admit. Cultures, stool studies have been sent.  ____________________________________________  FINAL CLINICAL IMPRESSION(S) / ED DIAGNOSES  Final diagnoses:  Colitis  Rectal bleeding  Dehydration     MEDICATIONS GIVEN DURING THIS VISIT:  Medications  piperacillin-tazobactam (ZOSYN) IVPB 3.375 g (3.375 g Intravenous New Bag/Given 08/08/19 0824)  sodium chloride flush (NS) 0.9 % injection 3 mL (3 mLs Intravenous Given 08/08/19 0823)  iohexol (OMNIPAQUE) 9 MG/ML oral solution 500 mL (500 mLs Oral Contrast Given 08/08/19 0215)  ondansetron (ZOFRAN-ODT) disintegrating tablet 4 mg (4 mg Oral Given 08/08/19 0214)     ED Discharge Orders  None       Note:  This document was prepared using Dragon voice recognition software and may include unintentional dictation errors.   Duffy Bruce, MD 08/08/19 0459    Duffy Bruce, MD 08/08/19 [id number]

## 2019-08-08 NOTE — Consult Note (Signed)
Pharmacy Antibiotic Note  Hannah Kirby is a 74 y.o. female admitted on 08/08/2019 with intra-abdominal infection.  Pharmacy has been consulted for Zosyn dosing.  Plan: Will start Zosyn 3.375g q8 hr extended infusion  Height: 5\' 5"  (165.1 cm) Weight: 78.9 kg (174 lb) IBW/kg (Calculated) : 57  Temp (24hrs), Avg:98.6 F (37 C), Min:98.6 F (37 C), Max:98.6 F (37 C)  Recent Labs  Lab 08/01/19 1215 08/07/19 2228  WBC 8.8 18.6*  CREATININE 1.68* 1.41*    Estimated Creatinine Clearance: 36.9 mL/min (A) (by C-G formula based on SCr of 1.41 mg/dL (H)).    Allergies  Allergen Reactions  . Spironolactone Shortness Of Breath  . Sulfonamide Derivatives Other (See Comments)    Last taken as a child; made her mouth break out  . Other Other (See Comments)    Mouth blisters  . Statins Other (See Comments)    Caused elevated liver function studies (02/22/17-pt taking rosuvastatin without problems) Caused elevated liver function studies Caused elevated liver function studies (02/22/17-pt taking rosuvastatin without problems) Caused elevated liver function studies  . Sulfa Antibiotics Other (See Comments)    Mouth blisters Welts on mouth   . Codeine Nausea And Vomiting and Other (See Comments)    Nausea  . Prednisone Palpitations and Other (See Comments)    Made her legs "feel weird."    Antimicrobials this admission: Zosyn 3.375g 6/17 >>  Dose adjustments this admission: None  Microbiology results: 6/17 BCx pending C diff/GI panel pending COVID pending  Thank you for allowing pharmacy to be a part of this patient's care.  Lu Duffel, PharmD, BCPS Clinical Pharmacist 08/08/2019 9:26 AM

## 2019-08-08 NOTE — ED Notes (Signed)
Pt assisted to toilet. Bright red blood noted in BM

## 2019-08-08 NOTE — ED Notes (Signed)
Pt assisted to toilet, bright red blood noted with BM

## 2019-08-08 NOTE — Plan of Care (Signed)
Cont oral vancomycin. Has had one bloody stool. No other issues observed.

## 2019-08-09 DIAGNOSIS — Z9841 Cataract extraction status, right eye: Secondary | ICD-10-CM | POA: Diagnosis not present

## 2019-08-09 DIAGNOSIS — J449 Chronic obstructive pulmonary disease, unspecified: Secondary | ICD-10-CM | POA: Diagnosis present

## 2019-08-09 DIAGNOSIS — Z961 Presence of intraocular lens: Secondary | ICD-10-CM | POA: Diagnosis present

## 2019-08-09 DIAGNOSIS — A0472 Enterocolitis due to Clostridium difficile, not specified as recurrent: Secondary | ICD-10-CM | POA: Diagnosis present

## 2019-08-09 DIAGNOSIS — I69354 Hemiplegia and hemiparesis following cerebral infarction affecting left non-dominant side: Secondary | ICD-10-CM | POA: Diagnosis not present

## 2019-08-09 DIAGNOSIS — Z79899 Other long term (current) drug therapy: Secondary | ICD-10-CM | POA: Diagnosis not present

## 2019-08-09 DIAGNOSIS — I129 Hypertensive chronic kidney disease with stage 1 through stage 4 chronic kidney disease, or unspecified chronic kidney disease: Secondary | ICD-10-CM | POA: Diagnosis present

## 2019-08-09 DIAGNOSIS — K219 Gastro-esophageal reflux disease without esophagitis: Secondary | ICD-10-CM | POA: Diagnosis present

## 2019-08-09 DIAGNOSIS — N200 Calculus of kidney: Secondary | ICD-10-CM | POA: Diagnosis present

## 2019-08-09 DIAGNOSIS — Z7982 Long term (current) use of aspirin: Secondary | ICD-10-CM | POA: Diagnosis not present

## 2019-08-09 DIAGNOSIS — F17210 Nicotine dependence, cigarettes, uncomplicated: Secondary | ICD-10-CM | POA: Diagnosis present

## 2019-08-09 DIAGNOSIS — E876 Hypokalemia: Secondary | ICD-10-CM | POA: Diagnosis present

## 2019-08-09 DIAGNOSIS — I700 Atherosclerosis of aorta: Secondary | ICD-10-CM | POA: Diagnosis present

## 2019-08-09 DIAGNOSIS — Z9842 Cataract extraction status, left eye: Secondary | ICD-10-CM | POA: Diagnosis not present

## 2019-08-09 DIAGNOSIS — Z87442 Personal history of urinary calculi: Secondary | ICD-10-CM | POA: Diagnosis not present

## 2019-08-09 DIAGNOSIS — I2510 Atherosclerotic heart disease of native coronary artery without angina pectoris: Secondary | ICD-10-CM | POA: Diagnosis present

## 2019-08-09 DIAGNOSIS — K529 Noninfective gastroenteritis and colitis, unspecified: Secondary | ICD-10-CM | POA: Diagnosis not present

## 2019-08-09 DIAGNOSIS — N1832 Chronic kidney disease, stage 3b: Secondary | ICD-10-CM | POA: Diagnosis present

## 2019-08-09 DIAGNOSIS — Z833 Family history of diabetes mellitus: Secondary | ICD-10-CM | POA: Diagnosis not present

## 2019-08-09 DIAGNOSIS — Z955 Presence of coronary angioplasty implant and graft: Secondary | ICD-10-CM | POA: Diagnosis not present

## 2019-08-09 DIAGNOSIS — E785 Hyperlipidemia, unspecified: Secondary | ICD-10-CM | POA: Diagnosis present

## 2019-08-09 DIAGNOSIS — Z20822 Contact with and (suspected) exposure to covid-19: Secondary | ICD-10-CM | POA: Diagnosis present

## 2019-08-09 DIAGNOSIS — E860 Dehydration: Secondary | ICD-10-CM | POA: Diagnosis present

## 2019-08-09 DIAGNOSIS — E1122 Type 2 diabetes mellitus with diabetic chronic kidney disease: Secondary | ICD-10-CM | POA: Diagnosis present

## 2019-08-09 DIAGNOSIS — K5732 Diverticulitis of large intestine without perforation or abscess without bleeding: Secondary | ICD-10-CM | POA: Diagnosis present

## 2019-08-09 HISTORY — DX: Enterocolitis due to Clostridium difficile, not specified as recurrent: A04.72

## 2019-08-09 LAB — GLUCOSE, CAPILLARY
Glucose-Capillary: 101 mg/dL — ABNORMAL HIGH (ref 70–99)
Glucose-Capillary: 129 mg/dL — ABNORMAL HIGH (ref 70–99)
Glucose-Capillary: 159 mg/dL — ABNORMAL HIGH (ref 70–99)
Glucose-Capillary: 189 mg/dL — ABNORMAL HIGH (ref 70–99)

## 2019-08-09 LAB — BASIC METABOLIC PANEL
Anion gap: 9 (ref 5–15)
BUN: 20 mg/dL (ref 8–23)
CO2: 27 mmol/L (ref 22–32)
Calcium: 9 mg/dL (ref 8.9–10.3)
Chloride: 103 mmol/L (ref 98–111)
Creatinine, Ser: 1.44 mg/dL — ABNORMAL HIGH (ref 0.44–1.00)
GFR calc Af Amer: 42 mL/min — ABNORMAL LOW (ref >60)
GFR calc non Af Amer: 36 mL/min — ABNORMAL LOW (ref >60)
Glucose, Bld: 106 mg/dL — ABNORMAL HIGH (ref 70–99)
Potassium: 3.5 mmol/L (ref 3.5–5.1)
Sodium: 139 mmol/L (ref 135–145)

## 2019-08-09 LAB — CBC
HCT: 44.2 % (ref 36.0–46.0)
Hemoglobin: 15 g/dL (ref 12.0–15.0)
MCH: 32.7 pg (ref 26.0–34.0)
MCHC: 33.9 g/dL (ref 30.0–36.0)
MCV: 96.3 fL (ref 80.0–100.0)
Platelets: 175 10*3/uL (ref 150–400)
RBC: 4.59 MIL/uL (ref 3.87–5.11)
RDW: 13.9 % (ref 11.5–15.5)
WBC: 17.5 10*3/uL — ABNORMAL HIGH (ref 4.0–10.5)
nRBC: 0 % (ref 0.0–0.2)

## 2019-08-09 LAB — MAGNESIUM: Magnesium: 1.6 mg/dL — ABNORMAL LOW (ref 1.7–2.4)

## 2019-08-09 MED ORDER — POTASSIUM CHLORIDE CRYS ER 20 MEQ PO TBCR
40.0000 meq | EXTENDED_RELEASE_TABLET | Freq: Once | ORAL | Status: AC
  Administered 2019-08-09: 40 meq via ORAL
  Filled 2019-08-09: qty 2

## 2019-08-09 MED ORDER — MAGNESIUM SULFATE 2 GM/50ML IV SOLN
2.0000 g | Freq: Once | INTRAVENOUS | Status: AC
  Administered 2019-08-09: 2 g via INTRAVENOUS
  Filled 2019-08-09: qty 50

## 2019-08-09 MED ORDER — VANCOMYCIN HCL 125 MG PO CAPS: 125.0000 mg | ORAL_CAPSULE | Freq: Four times a day (QID) | ORAL | 0 refills | Status: AC

## 2019-08-09 NOTE — Plan of Care (Signed)
Discharge order received. Patient mental status is at baseline. Vital signs stable . No signs of acute distress. Discharge instructions given. Patient verbalized understanding. No other issues noted at this time.   

## 2019-08-09 NOTE — Progress Notes (Signed)
PROGRESS NOTE    Hannah Kirby  AJO:[id number] DOB: [birth year] DOA: 08/08/2019 PCP: Birdie Sons, MD   Brief Narrative:  HPI: Hannah Kirby is a 74 y.o. female with medical history significant of hypertension, hyperlipidemia, diabetes mellitus, COPD, stroke, GERD, kidney stone, CKD stage III, CAD, stent placement, AAA, tobacco abuse, who presents with bloody diarrhea.  Pt was recently admitted to hospital due to stroke on 6/10. Pt was discharged on aspirin and Plavix.  Patient states that she started having diarrhea since yesterday, which becomes bloody today.  She has had more than 10 times of diarrhea.  Patient has crampy abdominal pain, which is located in central abdomen, constant, moderate, sharp, nonradiating.  She has nausea, but no vomiting.  Denies fever or chills.  Patient has mild dry cough, but no shortness of breath, chest pain.  Denies symptoms of UTI.  6/18: Patient seen and examined.  Very very tearful this morning.  I explained that she was unsafe for discharge today she still has significant leukocytosis and hyperactive bowel sounds consistent with acute C. difficile infection.   Assessment & Plan:   Principal Problem:   Acute hemorrhagic colitis Active Problems:   Tobacco use disorder   Coronary atherosclerosis   Acid reflux   Essential hypertension   Stage 3b chronic kidney disease   Stroke ([place])   Type II diabetes mellitus with renal manifestations (HCC)   Hypokalemia   Colitis   C. difficile colitis  Acute hemorrhagic colitis due to C. difficile colitis Hemoglobin 17.7 --> 16.4.  C. difficile test positive for antigen and negative for toxin, with positive PCR. GI, Dr. Allen Norris is consulted. No GI interventions indicated Plan: Continue oral vancomycin, 125 mg every 6 hours As needed pain control As needed antiemetics IV fluids Advance to soft diet Daily CBC  Tobacco use disorder -nicotine patch  Coronary atherosclerosis Recent CVA [place]  infarct noted on 6/10 s/p of stent. No CP -hold aspirin, Plavix for today.  Restart tomorrow if Hb stable -Continue Crestor and Zetia  Acid reflux -Protonix  HTN:  -Continue home medications Amlodipine lisinopril, metoprolol, -hydralazine prn  Stage 3b chronic kidney disease: stable -f/u by BMP  Hx of stroke (HCC) -hold aspirin, Plavix -Continue Crestor and Zetia   Type II diabetes mellitus with renal manifestations ([place]) Most recent A1c 8.0, poorly controled.  Patient is taking Victoza, Lantus at home -will decrease Lantus dose from 22-15 units daily  -SSI  Hypokalemia: K= 3.4  on admission. - Repleted - Check Mg level    DVT prophylaxis: SCDs Code Status: Full code Family Communication: None today, offered the patient declined Disposition Plan: Status is: Inpatient  Remains inpatient appropriate because:Inpatient level of care appropriate due to severity of illness   Dispo: The patient is from: Home              Anticipated d/c is to: Home              Anticipated d/c date is: 1 day              Patient currently is not medically stable to d/c.   Still with AKI, electrolyte abnormalities, persistent diarrhea, elevated white count.  Will need at least another 24 hours of inpatient monitoring.  Tentative plan to discharge home on 08/10/2019     Consultants:   GI  Procedures:   None  Antimicrobials:  P.o. vancomycin   Subjective: Seen and examined.  Very tearful this morning.  Just wants to  go home.  Objective: Vitals:   08/09/19 0307 08/09/19 0815 08/09/19 1140 08/09/19 1335  BP: (!) 160/81 139/73 (!) 142/71 126/62  Pulse: 71 65 71 66  Resp: 20 18 18 20   Temp: 98.4 F (36.9 C) 99 F (37.2 C) 98.6 F (37 C) 98 F (36.7 C)  TempSrc: Oral Oral Oral Oral  SpO2: 91% 90% 96% 90%  Weight:      Height:       No intake or output data in the 24 hours ending 08/09/19 1345 Filed Weights   08/07/19 2225  Weight: 78.9 kg     Examination:  General exam: Appears calm and comfortable  Respiratory system: Clear to auscultation. Respiratory effort normal. Cardiovascular system: S1 & S2 heard, RRR. No JVD, murmurs, rubs, gallops or clicks. No pedal edema. Gastrointestinal system: Hyperactive bowel sounds, nontender, nondistended  Central nervous system: Alert and oriented. No focal neurological deficits. Extremities: Symmetric 5 x 5 power. Skin: No rashes, lesions or ulcers Psychiatry: Judgement and insight appear normal. Mood & affect appropriate.     Data Reviewed: I have personally reviewed following labs and imaging studies  CBC: Recent Labs  Lab 08/07/19 2228 08/08/19 1019 08/08/19 1730 08/09/19 0050  WBC 18.6* 20.3* 18.3* 17.5*  HGB 17.7* 16.4* 16.1* 15.0  HCT 52.4* 48.6* 45.8 44.2  MCV 95.8 96.4 93.7 96.3  PLT 240 227 199 884   Basic Metabolic Panel: Recent Labs  Lab 08/07/19 2228 08/09/19 0050  NA 140 139  K 3.4* 3.5  CL 101 103  CO2 27 27  GLUCOSE 199* 106*  BUN 24* 20  CREATININE 1.41* 1.44*  CALCIUM 10.1 9.0  MG  --  1.6*   GFR: Estimated Creatinine Clearance: 36.1 mL/min (A) (by C-G formula based on SCr of 1.44 mg/dL (H)). Liver Function Tests: Recent Labs  Lab 08/07/19 2228  AST 26  ALT 21  ALKPHOS 89  BILITOT 0.9  PROT 7.7  ALBUMIN 4.1   Recent Labs  Lab 08/07/19 2228  LIPASE 31   No results for input(s): AMMONIA in the last 168 hours. Coagulation Profile: Recent Labs  Lab 08/08/19 1019  INR 1.0   Cardiac Enzymes: No results for input(s): CKTOTAL, CKMB, CKMBINDEX, TROPONINI in the last 168 hours. BNP (last 3 results) No results for input(s): PROBNP in the last 8760 hours. HbA1C: No results for input(s): HGBA1C in the last 72 hours. CBG: Recent Labs  Lab 08/08/19 2015 08/09/19 0010 08/09/19 0308 08/09/19 0811 08/09/19 1138  GLUCAP 221* 101* 159* 129* 189*   Lipid Profile: No results for input(s): CHOL, HDL, LDLCALC, TRIG, CHOLHDL, LDLDIRECT  in the last 72 hours. Thyroid Function Tests: No results for input(s): TSH, T4TOTAL, FREET4, T3FREE, THYROIDAB in the last 72 hours. Anemia Panel: No results for input(s): VITAMINB12, FOLATE, FERRITIN, TIBC, IRON, RETICCTPCT in the last 72 hours. Sepsis Labs: No results for input(s): PROCALCITON, LATICACIDVEN in the last 168 hours.  Recent Results (from the past 240 hour(s))  SARS Coronavirus 2 by RT PCR (hospital order, performed in [facility] hospital lab) Nasopharyngeal Nasopharyngeal Swab     Status: None   Collection Time: 08/01/19 12:24 PM   Specimen: Nasopharyngeal Swab  Result Value Ref Range Status   SARS Coronavirus 2 NEGATIVE NEGATIVE Final    Comment: (NOTE) SARS-CoV-2 target nucleic acids are NOT DETECTED.  The SARS-CoV-2 RNA is generally detectable in upper and lower respiratory specimens during the acute phase of infection. The lowest concentration of SARS-CoV-2 viral copies this assay can detect  is 250 copies / mL. A negative result does not preclude SARS-CoV-2 infection and should not be used as the sole basis for treatment or other patient management decisions.  A negative result may occur with improper specimen collection / handling, submission of specimen other than nasopharyngeal swab, presence of viral mutation(s) within the areas targeted by this assay, and inadequate number of viral copies (<250 copies / mL). A negative result must be combined with clinical observations, patient history, and epidemiological information.  Fact Sheet for Patients:   StrictlyIdeas.no  Fact Sheet for Healthcare Providers: BankingDealers.co.za  This test is not yet approved or  cleared by the [place] FDA and has been authorized for detection and/or diagnosis of SARS-CoV-2 by FDA under an Emergency Use Authorization (EUA).  This EUA will remain in effect (meaning this test can be used) for the duration of the COVID-19  declaration under Section 564(b)(1) of the Act, 21 U.S.C. section 360bbb-3(b)(1), unless the authorization is terminated or revoked sooner.  Performed at [facility], [address] [place], [place] [address]   Blood culture (routine x 2)     Status: None (Preliminary result)   Collection Time: 08/08/19  8:23 AM   Specimen: BLOOD  Result Value Ref Range Status   Specimen Description BLOOD LEFT FORE ARM  Final   Special Requests   Final    BOTTLES DRAWN AEROBIC AND ANAEROBIC Blood Culture adequate volume   Culture   Final    NO GROWTH < 24 HOURS Performed at [facility], [address] [place], [place] [address]    Report Status PENDING  Incomplete  Blood culture (routine x 2)     Status: None (Preliminary result)   Collection Time: 08/08/19  8:23 AM   Specimen: BLOOD  Result Value Ref Range Status   Specimen Description BLOOD LEFT ANTECUBITAL  Final   Special Requests   Final    BOTTLES DRAWN AEROBIC AND ANAEROBIC Blood Culture adequate volume   Culture   Final    NO GROWTH < 24 HOURS Performed at [facility], [address] [place], [place] [address]    Report Status PENDING  Incomplete  Gastrointestinal Panel by PCR , Stool     Status: None   Collection Time: 08/08/19  8:23 AM   Specimen: Stool  Result Value Ref Range Status   Campylobacter species NOT DETECTED NOT DETECTED Final   Plesimonas shigelloides NOT DETECTED NOT DETECTED Final   Salmonella species NOT DETECTED NOT DETECTED Final   Yersinia enterocolitica NOT DETECTED NOT DETECTED Final   Vibrio species NOT DETECTED NOT DETECTED Final   Vibrio cholerae NOT DETECTED NOT DETECTED Final   Enteroaggregative E coli (EAEC) NOT DETECTED NOT DETECTED Final   Enteropathogenic E coli (EPEC) NOT DETECTED NOT DETECTED Final   Enterotoxigenic E coli (ETEC) NOT DETECTED NOT DETECTED Final   Shiga like toxin producing E coli (STEC) NOT DETECTED NOT DETECTED Final   Shigella/Enteroinvasive E  coli (EIEC) NOT DETECTED NOT DETECTED Final   Cryptosporidium NOT DETECTED NOT DETECTED Final   Cyclospora cayetanensis NOT DETECTED NOT DETECTED Final   Entamoeba histolytica NOT DETECTED NOT DETECTED Final   Giardia lamblia NOT DETECTED NOT DETECTED Final   Adenovirus F40/41 NOT DETECTED NOT DETECTED Final   Astrovirus NOT DETECTED NOT DETECTED Final   Norovirus GI/GII NOT DETECTED NOT DETECTED Final   Rotavirus A NOT DETECTED NOT DETECTED Final   Sapovirus (I, II, IV, and V) NOT DETECTED NOT DETECTED Final  Comment: Performed at [facility], [place], [place] [address]  C Difficile Quick Screen w PCR reflex     Status: Abnormal   Collection Time: 08/08/19  8:23 AM   Specimen: Stool  Result Value Ref Range Status   C Diff antigen POSITIVE (A) NEGATIVE Final   C Diff toxin NEGATIVE NEGATIVE Final   C Diff interpretation Results are indeterminate. See PCR results.  Final    Comment: Performed at [facility], [address] [place], [place] [address]  SARS Coronavirus 2 by RT PCR (hospital order, performed in [facility] hospital lab) Nasopharyngeal Nasopharyngeal Swab     Status: None   Collection Time: 08/08/19  8:23 AM   Specimen: Nasopharyngeal Swab  Result Value Ref Range Status   SARS Coronavirus 2 NEGATIVE NEGATIVE Final    Comment: (NOTE) SARS-CoV-2 target nucleic acids are NOT DETECTED.  The SARS-CoV-2 RNA is generally detectable in upper and lower respiratory specimens during the acute phase of infection. The lowest concentration of SARS-CoV-2 viral copies this assay can detect is 250 copies / mL. A negative result does not preclude SARS-CoV-2 infection and should not be used as the sole basis for treatment or other patient management decisions.  A negative result may occur with improper specimen collection / handling, submission of specimen other than nasopharyngeal swab, presence of viral mutation(s) within the areas targeted by  this assay, and inadequate number of viral copies (<250 copies / mL). A negative result must be combined with clinical observations, patient history, and epidemiological information.  Fact Sheet for Patients:   StrictlyIdeas.no  Fact Sheet for Healthcare Providers: BankingDealers.co.za  This test is not yet approved or  cleared by the [place] FDA and has been authorized for detection and/or diagnosis of SARS-CoV-2 by FDA under an Emergency Use Authorization (EUA).  This EUA will remain in effect (meaning this test can be used) for the duration of the COVID-19 declaration under Section 564(b)(1) of the Act, 21 U.S.C. section 360bbb-3(b)(1), unless the authorization is terminated or revoked sooner.  Performed at [facility], [address], [place], [place] [address]   C. Diff by PCR, Reflexed     Status: Abnormal   Collection Time: 08/08/19  8:23 AM  Result Value Ref Range Status   Toxigenic C. Difficile by PCR POSITIVE (A) NEGATIVE Final    Comment: Positive for toxigenic C. difficile with little to no toxin production. Only treat if clinical presentation suggests symptomatic illness. Performed at [facility], [address] [place], [place] [address]          Radiology Studies: CT Abdomen Pelvis Wo Contrast  Result Date: 08/08/2019 CLINICAL DATA:  Diverticulitis, abdominal pain, GI bleed. EXAM: CT ABDOMEN AND PELVIS WITHOUT CONTRAST TECHNIQUE: Multidetector CT imaging of the abdomen and pelvis was performed following the standard protocol without IV contrast. COMPARISON:  10/17/2017 FINDINGS: Lower chest: Lung bases are clear. No effusions. Heart is normal size. Hepatobiliary: No focal hepatic abnormality. Gallbladder unremarkable. Pancreas: No focal abnormality or ductal dilatation. Spleen: No focal abnormality.  Normal size. Adrenals/Urinary Tract: Bilateral renal low-density lesions, likely cysts.  Small nonobstructing stone in the midpole of the left kidney. No hydronephrosis. Adrenal glands and urinary bladder unremarkable. Stomach/Bowel: Descending colonic and sigmoid diverticulosis. There is circumferential wall thickening and stranding noted from the distal transverse colon to the mid sigmoid colon compatible with colitis. Stomach and small bowel decompressed, unremarkable. Vascular/Lymphatic: Heavily calcified aorta and iliac vessels. No aneurysm or  adenopathy. Reproductive: Prior hysterectomy.  No adnexal masses. Other: No free fluid or free air. Musculoskeletal: No acute bony abnormality. IMPRESSION: Wall thickening and inflammation involving the colon from the distal transverse colon to the mid sigmoid colon compatible with infectious or inflammatory colitis. Left colonic diverticulosis. Aortic atherosclerosis. Left nephrolithiasis. Electronically Signed   By: Rolm Baptise M.D.   On: 08/08/2019 03:02   DG Chest Portable 1 View  Result Date: 08/08/2019 CLINICAL DATA:  Short of breath EXAM: PORTABLE CHEST 1 VIEW COMPARISON:  07/24/2014 FINDINGS: Cardiac and mediastinal contours normal. Atherosclerotic aorta. Negative for heart failure. Mild apical scarring. No acute infiltrate effusion or mass. IMPRESSION: No active disease. Electronically Signed   By: Franchot Gallo M.D.   On: 08/08/2019 09:10        Scheduled Meds: . amLODipine  5 mg Oral Daily  . ezetimibe  10 mg Oral Daily  . fluticasone furoate-vilanterol  1 puff Inhalation Daily  . insulin aspart  0-9 Units Subcutaneous Q4H  . insulin glargine  15 Units Subcutaneous Daily  . lisinopril  20 mg Oral Daily  . metoprolol tartrate  100 mg Oral BID  . multivitamin with minerals  1 tablet Oral Daily  . nicotine  21 mg Transdermal Daily  . omega-3 acid ethyl esters  1 g Oral Daily  . pantoprazole  40 mg Oral Daily  . rosuvastatin  10 mg Oral Daily  . sucralfate  1 g Oral BID  . tiotropium  18 mcg Inhalation Daily  . vancomycin   125 mg Oral QID  . cyanocobalamin  1,000 mcg Oral Daily   Continuous Infusions: . sodium chloride 75 mL/hr at 08/08/19 2149  . piperacillin-tazobactam (ZOSYN)  IV 3.375 g (08/09/19 0552)     LOS: 0 days    Time spent: 35 minutes    Sidney Ace, MD [facility] Pager [phone]  If 7PM-7AM, please contact night-coverage 08/09/2019, 1:45 PM

## 2019-08-09 NOTE — Care Management (Signed)
Patient to discharge on oral vancomycin  Script was electronically sent in to [facility]. RNCM called [facility], they do not have enough to fill the prescription  Patient request for prescription to be called in to [facility] on the corner of [address] and [address].  RNCM called in verbal script to [facility].  They are unable to give me out of pocket cost as insurance is not on file.  Patient notified.  MD and Bedside RN notified.

## 2019-08-09 NOTE — Progress Notes (Signed)
Hannah Lame, MD [facility]   [address], [place], [place] [address] Phone: [phone] Fax : [phone]   Subjective: This patient states that she is feeling much better with no further bleeding from her rectum.  The patient also reports that her abdominal pain is improved.  She is very tearful because she is worried about her husband who is disabled and unable to take care of himself.  The patient states that her family is out of town and cannot look in on her husband.  Her white cell count has decreased but very minimally.   Objective: Vital signs in last 24 hours: Vitals:   08/09/19 0307 08/09/19 0815 08/09/19 1140 08/09/19 1335  BP: (!) 160/81 139/73 (!) 142/71 126/62  Pulse: 71 65 71 66  Resp: 20 18 18 20   Temp: 98.4 F (36.9 C) 99 F (37.2 C) 98.6 F (37 C) 98 F (36.7 C)  TempSrc: Oral Oral Oral Oral  SpO2: 91% 90% 96% 90%  Weight:      Height:       Weight change:  No intake or output data in the 24 hours ending 08/09/19 1538   Exam: Heart:: Regular rate and rhythm, S1S2 present or without murmur or extra heart sounds Lungs: normal and clear to auscultation and percussion Abdomen: soft, nontender, normal bowel sounds   Lab Results: @LABTEST2 @ Micro Results: Recent Results (from the past 240 hour(s))  SARS Coronavirus 2 by RT PCR (hospital order, performed in [place] hospital lab) Nasopharyngeal Nasopharyngeal Swab     Status: None   Collection Time: 08/01/19 12:24 PM   Specimen: Nasopharyngeal Swab  Result Value Ref Range Status   SARS Coronavirus 2 NEGATIVE NEGATIVE Final    Comment: (NOTE) SARS-CoV-2 target nucleic acids are NOT DETECTED.  The SARS-CoV-2 RNA is generally detectable in upper and lower respiratory specimens during the acute phase of infection. The lowest concentration of SARS-CoV-2 viral copies this assay can detect is 250 copies / mL. A negative result does not preclude SARS-CoV-2 infection and should not be used as the sole  basis for treatment or other patient management decisions.  A negative result may occur with improper specimen collection / handling, submission of specimen other than nasopharyngeal swab, presence of viral mutation(s) within the areas targeted by this assay, and inadequate number of viral copies (<250 copies / mL). A negative result must be combined with clinical observations, patient history, and epidemiological information.  Fact Sheet for Patients:   StrictlyIdeas.no  Fact Sheet for Healthcare Providers: BankingDealers.co.za  This test is not yet approved or  cleared by the [place] FDA and has been authorized for detection and/or diagnosis of SARS-CoV-2 by FDA under an Emergency Use Authorization (EUA).  This EUA will remain in effect (meaning this test can be used) for the duration of the COVID-19 declaration under Section 564(b)(1) of the Act, 21 U.S.C. section 360bbb-3(b)(1), unless the authorization is terminated or revoked sooner.  Performed at [facility], [address] [place], [place] [address]   Blood culture (routine x 2)     Status: None (Preliminary result)   Collection Time: 08/08/19  8:23 AM   Specimen: BLOOD  Result Value Ref Range Status   Specimen Description BLOOD LEFT FORE ARM  Final   Special Requests   Final    BOTTLES DRAWN AEROBIC AND ANAEROBIC Blood Culture adequate volume   Culture   Final    NO GROWTH < 24 HOURS Performed at [facility], [address] [place], [place] [address]    Report Status PENDING  Incomplete  Blood culture (routine x 2)     Status: None (Preliminary result)   Collection Time: 08/08/19  8:23 AM   Specimen: BLOOD  Result Value Ref Range Status   Specimen Description BLOOD LEFT ANTECUBITAL  Final   Special Requests   Final    BOTTLES DRAWN AEROBIC AND ANAEROBIC Blood Culture adequate volume   Culture   Final    NO GROWTH < 24 HOURS Performed at  [facility], [address] [place], [place] [address]    Report Status PENDING  Incomplete  Gastrointestinal Panel by PCR , Stool     Status: None   Collection Time: 08/08/19  8:23 AM   Specimen: Stool  Result Value Ref Range Status   Campylobacter species NOT DETECTED NOT DETECTED Final   Plesimonas shigelloides NOT DETECTED NOT DETECTED Final   Salmonella species NOT DETECTED NOT DETECTED Final   Yersinia enterocolitica NOT DETECTED NOT DETECTED Final   Vibrio species NOT DETECTED NOT DETECTED Final   Vibrio cholerae NOT DETECTED NOT DETECTED Final   Enteroaggregative E coli (EAEC) NOT DETECTED NOT DETECTED Final   Enteropathogenic E coli (EPEC) NOT DETECTED NOT DETECTED Final   Enterotoxigenic E coli (ETEC) NOT DETECTED NOT DETECTED Final   Shiga like toxin producing E coli (STEC) NOT DETECTED NOT DETECTED Final   Shigella/Enteroinvasive E coli (EIEC) NOT DETECTED NOT DETECTED Final   Cryptosporidium NOT DETECTED NOT DETECTED Final   Cyclospora cayetanensis NOT DETECTED NOT DETECTED Final   Entamoeba histolytica NOT DETECTED NOT DETECTED Final   Giardia lamblia NOT DETECTED NOT DETECTED Final   Adenovirus F40/41 NOT DETECTED NOT DETECTED Final   Astrovirus NOT DETECTED NOT DETECTED Final   Norovirus GI/GII NOT DETECTED NOT DETECTED Final   Rotavirus A NOT DETECTED NOT DETECTED Final   Sapovirus (I, II, IV, and V) NOT DETECTED NOT DETECTED Final    Comment: Performed at [facility], [address] [place], [place] [address]  C Difficile Quick Screen w PCR reflex     Status: Abnormal   Collection Time: 08/08/19  8:23 AM   Specimen: Stool  Result Value Ref Range Status   C Diff antigen POSITIVE (A) NEGATIVE Final   C Diff toxin NEGATIVE NEGATIVE Final   C Diff interpretation Results are indeterminate. See PCR results.  Final    Comment: Performed at [facility], [address] [place], [place] [address]  SARS Coronavirus 2 by RT PCR (hospital  order, performed in [facility] hospital lab) Nasopharyngeal Nasopharyngeal Swab     Status: None   Collection Time: 08/08/19  8:23 AM   Specimen: Nasopharyngeal Swab  Result Value Ref Range Status   SARS Coronavirus 2 NEGATIVE NEGATIVE Final    Comment: (NOTE) SARS-CoV-2 target nucleic acids are NOT DETECTED.  The SARS-CoV-2 RNA is generally detectable in upper and lower respiratory specimens during the acute phase of infection. The lowest concentration of SARS-CoV-2 viral copies this assay can detect is 250 copies / mL. A negative result does not preclude SARS-CoV-2 infection and should not be used as the sole basis for treatment or other patient management decisions.  A negative result may occur with improper specimen collection / handling, submission of specimen other than nasopharyngeal swab, presence of viral mutation(s) within the areas targeted by this assay, and inadequate number of viral copies (<250 copies / mL). A negative result must be combined with clinical  observations, patient history, and epidemiological information.  Fact Sheet for Patients:   StrictlyIdeas.no  Fact Sheet for Healthcare Providers: BankingDealers.co.za  This test is not yet approved or  cleared by the [place] FDA and has been authorized for detection and/or diagnosis of SARS-CoV-2 by FDA under an Emergency Use Authorization (EUA).  This EUA will remain in effect (meaning this test can be used) for the duration of the COVID-19 declaration under Section 564(b)(1) of the Act, 21 U.S.C. section 360bbb-3(b)(1), unless the authorization is terminated or revoked sooner.  Performed at [facility], [address] [place], [place] [address]   C. Diff by PCR, Reflexed     Status: Abnormal   Collection Time: 08/08/19  8:23 AM  Result Value Ref Range Status   Toxigenic C. Difficile by PCR POSITIVE (A) NEGATIVE Final    Comment: Positive for  toxigenic C. difficile with little to no toxin production. Only treat if clinical presentation suggests symptomatic illness. Performed at [facility], [address], [place], [place] [address]    Studies/Results: CT Abdomen Pelvis Wo Contrast  Result Date: 08/08/2019 CLINICAL DATA:  Diverticulitis, abdominal pain, GI bleed. EXAM: CT ABDOMEN AND PELVIS WITHOUT CONTRAST TECHNIQUE: Multidetector CT imaging of the abdomen and pelvis was performed following the standard protocol without IV contrast. COMPARISON:  10/17/2017 FINDINGS: Lower chest: Lung bases are clear. No effusions. Heart is normal size. Hepatobiliary: No focal hepatic abnormality. Gallbladder unremarkable. Pancreas: No focal abnormality or ductal dilatation. Spleen: No focal abnormality.  Normal size. Adrenals/Urinary Tract: Bilateral renal low-density lesions, likely cysts. Small nonobstructing stone in the midpole of the left kidney. No hydronephrosis. Adrenal glands and urinary bladder unremarkable. Stomach/Bowel: Descending colonic and sigmoid diverticulosis. There is circumferential wall thickening and stranding noted from the distal transverse colon to the mid sigmoid colon compatible with colitis. Stomach and small bowel decompressed, unremarkable. Vascular/Lymphatic: Heavily calcified aorta and iliac vessels. No aneurysm or adenopathy. Reproductive: Prior hysterectomy.  No adnexal masses. Other: No free fluid or free air. Musculoskeletal: No acute bony abnormality. IMPRESSION: Wall thickening and inflammation involving the colon from the distal transverse colon to the mid sigmoid colon compatible with infectious or inflammatory colitis. Left colonic diverticulosis. Aortic atherosclerosis. Left nephrolithiasis. Electronically Signed   By: Rolm Baptise M.D.   On: 08/08/2019 03:02   DG Chest Portable 1 View  Result Date: 08/08/2019 CLINICAL DATA:  Short of breath EXAM: PORTABLE CHEST 1 VIEW COMPARISON:  07/24/2014 FINDINGS:  Cardiac and mediastinal contours normal. Atherosclerotic aorta. Negative for heart failure. Mild apical scarring. No acute infiltrate effusion or mass. IMPRESSION: No active disease. Electronically Signed   By: Franchot Gallo M.D.   On: 08/08/2019 09:10   Medications: I have reviewed the patient's current medications. Scheduled Meds: . amLODipine  5 mg Oral Daily  . ezetimibe  10 mg Oral Daily  . fluticasone furoate-vilanterol  1 puff Inhalation Daily  . insulin aspart  0-9 Units Subcutaneous Q4H  . insulin glargine  15 Units Subcutaneous Daily  . lisinopril  20 mg Oral Daily  . metoprolol tartrate  100 mg Oral BID  . multivitamin with minerals  1 tablet Oral Daily  . nicotine  21 mg Transdermal Daily  . omega-3 acid ethyl esters  1 g Oral Daily  . pantoprazole  40 mg Oral Daily  . rosuvastatin  10 mg Oral Daily  . sucralfate  1 g Oral BID  . tiotropium  18 mcg Inhalation Daily  . vancomycin  125 mg Oral  QID  . cyanocobalamin  1,000 mcg Oral Daily   Continuous Infusions: . sodium chloride 75 mL/hr at 08/08/19 2149  . piperacillin-tazobactam (ZOSYN)  IV 3.375 g (08/09/19 1431)   PRN Meds:.acetaminophen, albuterol, hydrALAZINE, HYDROcodone-acetaminophen, morphine injection, nitroGLYCERIN, ondansetron (ZOFRAN) IV   Assessment: Principal Problem:   Acute hemorrhagic colitis Active Problems:   Tobacco use disorder   Coronary atherosclerosis   Acid reflux   Essential hypertension   Stage 3b chronic kidney disease   Stroke (HCC)   Type II diabetes mellitus with renal manifestations (HCC)   Hypokalemia   Colitis   C. difficile colitis    Plan: I have spoken to the hospitalist and since the patient is on p.o. antibiotics and she is improving I have suggested that it would be okay from my point of view if the patient is sent home to continue her antibiotics so that she can take care of her husband.  The patient is at increased risk of bleeding with anticoagulation and risk versus  benefit needs to be assessed but the patient should at least be on aspirin.  She has also agreed that if she has any sign of GI bleeding or abdominal pain that she will come back to the ER.   LOS: 0 days   Hannah Kirby 08/09/2019, 3:38 PM Pager [phone] 7am-5pm  Check AMION for 5pm -7am coverage and on weekends

## 2019-08-09 NOTE — Discharge Summary (Signed)
Physician Discharge Summary  Hannah Kirby AYT:[id number] DOB: [birth year] DOA: 08/08/2019  PCP: Birdie Sons, MD  Admit date: 08/08/2019 Discharge date: 08/09/2019  Admitted From: Home Disposition: Home  Recommendations for Outpatient Follow-up:  1. Follow up with PCP in 1-2 weeks 2. Please return to the ED if you have any further rectal bleeding or severe abdominal pain or fevers  Home Health: No Equipment/Devices: None  Discharge Condition: Stable CODE STATUS:*Full Diet recommendation: Bland, soft Brief/Interim Summary: TFT:DDUKG C Hannah Kirby a 74 y.o.femalewith medical history significant ofhypertension, hyperlipidemia, diabetes mellitus, COPD, stroke, GERD, kidney stone, CKD stage III, CAD, stent placement, AAA, tobacco abuse, who presents with bloody diarrhea.  Pt wasrecently admitted to hospital due to stroke on 6/10. Pt wasdischarged on aspirin and Plavix. Patient states that she started having diarrhea since yesterday, which becomes bloody today.She has had more than 10 times of diarrhea. Patient has crampy abdominal pain, which is located in central abdomen, constant, moderate, sharp, nonradiating. She has nausea, but no vomiting. Denies fever or chills. Patient has mild dry cough, but no shortness of breath, chest pain. Denies symptoms of UTI.  6/18: Patient seen and examined.  Very very tearful this morning.  I explained that she was unsafe for discharge today she still has significant leukocytosis and hyperactive bowel sounds consistent with acute C. difficile infection.  6/18: GI and spoke to the patient at bedside.  The patient is extremely agitated, upset, bawling.  She states that she has a husband at home who was unable to care for himself.  She has nobody else to able to care for him.  She pleaded to going home.  And finally with clear return to ED instructions provided we have agreed.  Will prescribe additional 9 days of vancomycin p.o. 125 mg every 6  hours.  Discharge literature provided on C. difficile.  Recommend the patient hold the Plavix for additional 2 days and resume on 620 if her rectal bleeding resolves.  Clear return to ED instructions have been provided.  Discharge Diagnoses:  Principal Problem:   Acute hemorrhagic colitis Active Problems:   Tobacco use disorder   Coronary atherosclerosis   Acid reflux   Essential hypertension   Stage 3b chronic kidney disease   Stroke ([place])   Type II diabetes mellitus with renal manifestations (HCC)   Hypokalemia   Colitis   C. difficile colitis  Acute hemorrhagic colitisdue toC. difficile colitis Hemoglobin 17.7-->16.4.  C. difficile test positive for antigenandnegative for toxin, with positive PCR.GI, Dr. Allen Norris is consulted. No GI interventions indicated Patient had improving white count No fevers noted over interval Still with some diarrhea though decreased in frequency Still with hyperactive bowel sounds Patient stated that she needs to care for her sick husband at home Agreeable to discharge at this time Clear return to ED instructions have been provided C. difficile literature provided on discharge Patient will complete additional 9 days p.o. vancomycin   Tobacco use disorder -nicotine patch  Coronary atherosclerosis Recent CVA Lacunar infarct noted on 6/10 s/p of stent. No CP -Aspirin restarted.  Plavix held until 08/11/2019 -Continue Crestor and Zetia  Acid reflux -Protonix  HTN:  -Continue home medications Amlodipine lisinopril, metoprolol, -hydralazine prn  Stage 3b chronic kidney disease: stable -f/u by BMP  Hx of stroke (HCC) -Aspirin restarted, Plavix held until 08/11/2019 -Continue Crestor and Zetia  Type II diabetes mellitus with renal manifestations ([place]) Most recent A1c8.0, poorly controled.  Patient is takingVictoza, Lantusat home Can resume home regimen on discharge  Hypokalemia: K=3.4on admission. - Repleted -  Check Mg level  Discharge Instructions  Discharge Instructions    Diet - low sodium heart healthy   Complete by: As directed    Increase activity slowly   Complete by: As directed      Allergies as of 08/09/2019      Reactions   Spironolactone Shortness Of Breath   Sulfonamide Derivatives Other (See Comments)   Last taken as a child; made her mouth break out   Other Other (See Comments)   Mouth blisters   Sulfa Antibiotics Other (See Comments)   Mouth blisters Welts on mouth   Codeine Nausea And Vomiting, Other (See Comments)   Nausea   Prednisone Palpitations, Other (See Comments)   Made her legs "feel weird."      Medication List    TAKE these medications   acetaminophen 500 MG tablet Commonly known as: TYLENOL Take 500 mg by mouth every 6 (six) hours as needed.   albuterol 108 (90 Base) MCG/ACT inhaler Commonly known as: Ventolin HFA Inhale 2 puffs into the lungs every 4 (four) hours as needed for wheezing or shortness of breath.   amLODipine 5 MG tablet Commonly known as: NORVASC Take 1 tablet (5 mg total) by mouth daily.   Aspirin 81 81 MG EC tablet Generic drug: aspirin Take 81 mg by mouth daily.   Breo Ellipta 100-25 MCG/INH Aepb Generic drug: fluticasone furoate-vilanterol Inhale 1 puff into the lungs daily.   clopidogrel 75 MG tablet Commonly known as: Plavix Take 1 tablet (75 mg total) by mouth daily.   cyanocobalamin 1000 MCG tablet Take 1,000 mcg by mouth daily.   Daily Multi Tabs Take 1 tablet by mouth daily.   ezetimibe 10 MG tablet Commonly known as: ZETIA Take 1 tablet (10 mg total) by mouth daily.   Fish Oil 1000 MG Caps Take 2 capsules by mouth in the morning and at bedtime.   HYDROcodone-acetaminophen 5-325 MG tablet Commonly known as: NORCO/VICODIN Take 1 tablet by mouth every 6 (six) hours as needed.   insulin glargine 100 UNIT/ML injection Commonly known as: LANTUS Inject 22 Units into the skin daily.   lisinopril 20 MG  tablet Commonly known as: ZESTRIL Take 1 tablet (20 mg total) by mouth daily.   metoprolol tartrate 100 MG tablet Commonly known as: LOPRESSOR Take 1 tablet (100 mg total) by mouth 2 (two) times daily.   nitroGLYCERIN 0.4 MG SL tablet Commonly known as: Nitrostat Place 1 tablet (0.4 mg total) under the tongue every 5 (five) minutes as needed.   ondansetron 4 MG disintegrating tablet Commonly known as: Zofran ODT Take 1 tablet (4 mg total) by mouth every 8 (eight) hours as needed for nausea or vomiting.   RABEprazole 20 MG tablet Commonly known as: ACIPHEX Take 20 mg by mouth daily. 15 Mins. before evening meal   rosuvastatin 10 MG tablet Commonly known as: CRESTOR Take 1 tablet (10 mg total) by mouth daily.   sucralfate 1 g tablet Commonly known as: CARAFATE Take 1 g by mouth 2 (two) times daily. 15 Minutes before evening meal and at bed time   tiotropium 18 MCG inhalation capsule Commonly known as: SPIRIVA Place 1 capsule (18 mcg total) into inhaler and inhale daily.   vancomycin 125 MG capsule Commonly known as: Vancocin HCl Take 1 capsule (125 mg total) by mouth 4 (four) times daily for 9 days.   Victoza 18 MG/3ML Soln injection Generic drug: Liraglutide Inject 1.8 mg into the  skin daily.       Allergies  Allergen Reactions  . Spironolactone Shortness Of Breath  . Sulfonamide Derivatives Other (See Comments)    Last taken as a child; made her mouth break out  . Other Other (See Comments)    Mouth blisters  . Sulfa Antibiotics Other (See Comments)    Mouth blisters Welts on mouth   . Codeine Nausea And Vomiting and Other (See Comments)    Nausea  . Prednisone Palpitations and Other (See Comments)    Made her legs "feel weird."    Consultations:  GI   Procedures/Studies: CT Abdomen Pelvis Wo Contrast  Result Date: 08/08/2019 CLINICAL DATA:  Diverticulitis, abdominal pain, GI bleed. EXAM: CT ABDOMEN AND PELVIS WITHOUT CONTRAST TECHNIQUE:  Multidetector CT imaging of the abdomen and pelvis was performed following the standard protocol without IV contrast. COMPARISON:  10/17/2017 FINDINGS: Lower chest: Lung bases are clear. No effusions. Heart is normal size. Hepatobiliary: No focal hepatic abnormality. Gallbladder unremarkable. Pancreas: No focal abnormality or ductal dilatation. Spleen: No focal abnormality.  Normal size. Adrenals/Urinary Tract: Bilateral renal low-density lesions, likely cysts. Small nonobstructing stone in the midpole of the left kidney. No hydronephrosis. Adrenal glands and urinary bladder unremarkable. Stomach/Bowel: Descending colonic and sigmoid diverticulosis. There is circumferential wall thickening and stranding noted from the distal transverse colon to the mid sigmoid colon compatible with colitis. Stomach and small bowel decompressed, unremarkable. Vascular/Lymphatic: Heavily calcified aorta and iliac vessels. No aneurysm or adenopathy. Reproductive: Prior hysterectomy.  No adnexal masses. Other: No free fluid or free air. Musculoskeletal: No acute bony abnormality. IMPRESSION: Wall thickening and inflammation involving the colon from the distal transverse colon to the mid sigmoid colon compatible with infectious or inflammatory colitis. Left colonic diverticulosis. Aortic atherosclerosis. Left nephrolithiasis. Electronically Signed   By: Rolm Baptise M.D.   On: 08/08/2019 03:02   MR BRAIN WO CONTRAST  Result Date: 08/01/2019 CLINICAL DATA:  Stroke suspected. EXAM: MRI HEAD WITHOUT CONTRAST TECHNIQUE: Multiplanar, multiecho pulse sequences of the brain and surrounding structures were obtained without intravenous contrast. COMPARISON:  Noncontrast head CT performed earlier the same day 08/01/2019, brain MRI/MRA 11/08/2016. FINDINGS: Brain: There is an 8 mm acute lacunar infarct within the right thalamocapsular junction (series 5, images 26-27) (series 8, image 18). Additionally, there is a small linear focus of  diffusion weighted hyperintensity within the left corona radiata (series 5, image 31) (series 8, image 20). There are portions which do not demonstrate convincing corresponding T2 shine through on the ADC map, and additional small acute infarct at this site cannot be exclude. This is in the region of a chronic left corona radiata/basal ganglia lacunar infarct. Background moderate patchy T2/FLAIR hyperintensity within the cerebral white matter is nonspecific, but consistent with chronic small vessel ischemic disease. Redemonstrated additional chronic lacunar infarcts within the corona radiata, basal ganglia, right thalamus and central pons. Chronic microhemorrhages within the cerebral hemispheres, basal ganglia, thalami and cerebellum have progressed as compared to the prior MRI. 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 Stable, mild generalized parenchymal atrophy. No evidence of intracranial mass. No extra-axial fluid collection. No midline shift. Partially empty sella turcica. Vascular: Expected proximal arterial flow voids. Skull and upper cervical spine: No focal marrow lesion. There is a transversely oriented focus of T1 hypointensity within the base of the dens (series 7, image 12). Findings are suspicious for an odontoid fracture. Sinuses/Orbits: Visualized orbits show no acute finding. Mild ethmoid sinus mucosal thickening.  No significant mastoid effusion. Impressions 1,2 and 6 below will be called to the ordering clinician or representative by the Radiologist Assistant, and communication documented in the PACS or [employer]. IMPRESSION: 1. 8 mm acute lacunar infarct within the right thalamocapsular junction. 2. An additional small acute white matter infarct is questioned within the left corona radiata, as described. 3. Background moderate chronic small vessel ischemic disease with multiple chronic lacunar infarcts,  otherwise stable stable. 4. 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 5. Stable, mild generalized parenchymal atrophy. 6. Signal abnormality within the base of the dens suspicious for an odontoid fracture. This finding was not present on prior MRI 10/30/2016. CT of the cervical spine is recommended for further evaluation, as clinically warranted. Electronically Signed   By: Kellie Simmering DO   On: 08/01/2019 17:29   US Carotid Bilateral (at [facility] and [facility] only)  Result Date: 08/02/2019 CLINICAL DATA:  CAD. History of stroke/TIA, CAD, hypertension, hyperlipidemia, diabetes and smoking. EXAM: BILATERAL CAROTID DUPLEX ULTRASOUND TECHNIQUE: Pearline Cables scale imaging, color Doppler and duplex ultrasound were performed of bilateral carotid and vertebral arteries in the neck. COMPARISON:  10/30/2016 FINDINGS: Criteria: Quantification of carotid stenosis is based on velocity parameters that correlate the residual internal carotid diameter with NASCET-based stenosis levels, using the diameter of the distal internal carotid lumen as the denominator for stenosis measurement. The following velocity measurements were obtained: RIGHT ICA: 91/21 cm/sec CCA: 83/38 cm/sec SYSTOLIC ICA/CCA RATIO:  1.9 ECA: 102 cm/sec LEFT ICA: 81/27 cm/sec CCA: 25/05 cm/sec SYSTOLIC ICA/CCA RATIO:  1.4 ECA: 78 cm/sec RIGHT CAROTID ARTERY: There is a minimal amount of echogenic plaque within the right carotid bulb (image 9 and 27). There is a minimal amount of eccentric echogenic plaque involving the origin and proximal aspects of the right internal carotid artery (image 35), not resulting in elevated peak systolic velocities within the interrogated course of the right internal carotid artery to suggest a hemodynamically significant stenosis. RIGHT VERTEBRAL ARTERY:  Antegrade flow LEFT CAROTID  ARTERY: There is a minimal amount of eccentric echogenic plaque within the left carotid bulb, not resulting in elevated peak systolic velocities within the interrogated course of the left internal carotid artery to suggest a hemodynamically significant stenosis. LEFT VERTEBRAL ARTERY:  Antegrade flow IMPRESSION: Minimal amount of bilateral atherosclerotic plaque, morphologically similar to the 2018 examination, and again not resulting in a hemodynamically significant stenosis within either internal carotid artery. Electronically Signed   By: Sandi Mariscal M.D.   On: 08/02/2019 08:53   DG Chest Portable 1 View  Result Date: 08/08/2019 CLINICAL DATA:  Short of breath EXAM: PORTABLE CHEST 1 VIEW COMPARISON:  07/24/2014 FINDINGS: Cardiac and mediastinal contours normal. Atherosclerotic aorta. Negative for heart failure. Mild apical scarring. No acute infiltrate effusion or mass. IMPRESSION: No active disease. Electronically Signed   By: Franchot Gallo M.D.   On: 08/08/2019 09:10   ECHOCARDIOGRAM COMPLETE  Result Date: 08/01/2019    ECHOCARDIOGRAM REPORT   Patient Name:   Hannah Kirby Date of Exam: 08/01/2019 Medical Rec #:  [id number]        Height:       66.0 in Accession #:    [id number]       Weight:       176.6 lb Date of Birth:  03-Dec-1945        BSA:          1.897 m Patient  Age:    45 years         BP:           170/86 mmHg Patient Gender: F                HR:           71 bpm. Exam Location:  [facility] Procedure: 2D Echo, Cardiac Doppler and Color Doppler Indications:     Stroke 434.91  History:         Patient has prior history of Echocardiogram examinations, most                  recent 06/28/2018. CAD, Stroke and COPD; Risk Factors:Diabetes.                  History of smoking.  Sonographer:     Sherrie Sport RDCS (AE) Referring Phys:  [id number] Collier Bullock Diagnosing Phys: Ida Rogue MD  Sonographer Comments: Image acquisition challenging due to COPD. IMPRESSIONS  1. Left ventricular ejection fraction, by  estimation, is 60 to 65%. The left ventricle has normal function. The left ventricle has no regional wall motion abnormalities. There is moderate left ventricular hypertrophy. Left ventricular diastolic parameters are indeterminate.  2. Right ventricular systolic function is normal. The right ventricular size is normal. There is normal pulmonary artery systolic pressure.  3. The inferior vena cava is normal in size with greater than 50% respiratory variability, suggesting right atrial pressure of 3 mmHg. FINDINGS  Left Ventricle: Left ventricular ejection fraction, by estimation, is 60 to 65%. The left ventricle has normal function. The left ventricle has no regional wall motion abnormalities. The left ventricular internal cavity size was normal in size. There is  moderate left ventricular hypertrophy. Left ventricular diastolic parameters are indeterminate. Right Ventricle: The right ventricular size is normal. No increase in right ventricular wall thickness. Right ventricular systolic function is normal. There is normal pulmonary artery systolic pressure. The tricuspid regurgitant velocity is 1.90 m/s, and  with an assumed right atrial pressure of 10 mmHg, the estimated right ventricular systolic pressure is 34.1 mmHg. Left Atrium: Left atrial size was normal in size. Right Atrium: Right atrial size was normal in size. Pericardium: There is no evidence of pericardial effusion. Mitral Valve: The mitral valve is normal in structure. Normal mobility of the mitral valve leaflets. Mild mitral valve regurgitation. No evidence of mitral valve stenosis. Tricuspid Valve: The tricuspid valve is normal in structure. Tricuspid valve regurgitation is trivial. No evidence of tricuspid stenosis. Aortic Valve: The aortic valve is normal in structure. Aortic valve regurgitation is not visualized. Mild aortic valve sclerosis is present, with no evidence of aortic valve stenosis. Aortic valve mean gradient measures 2.0 mmHg. Aortic  valve peak gradient measures 3.3 mmHg. Aortic valve area, by VTI measures 2.73 cm. Pulmonic Valve: The pulmonic valve was normal in structure. Pulmonic valve regurgitation is not visualized. No evidence of pulmonic stenosis. Aorta: The aortic root is normal in size and structure. Venous: The inferior vena cava is normal in size with greater than 50% respiratory variability, suggesting right atrial pressure of 3 mmHg. IAS/Shunts: No atrial level shunt detected by color flow Doppler.  LEFT VENTRICLE PLAX 2D LVIDd:         4.43 cm LVIDs:         2.95 cm LV PW:         1.15 cm LV IVS:        1.20 cm LVOT diam:  2.00 cm LV SV:         50 LV SV Index:   26 LVOT Area:     3.14 cm  RIGHT VENTRICLE RV Basal diam:  4.40 cm RV S prime:     10.60 cm/s TAPSE (M-mode): 4.1 cm LEFT ATRIUM             Index       RIGHT ATRIUM           Index LA diam:        3.60 cm 1.90 cm/m  RA Area:     26.20 cm LA Vol (A2C):   32.6 ml 17.19 ml/m RA Volume:   94.10 ml  49.61 ml/m LA Vol (A4C):   39.4 ml 20.77 ml/m LA Biplane Vol: 37.8 ml 19.93 ml/m  AORTIC VALVE                   PULMONIC VALVE AV Area (Vmax):    2.81 cm    PV Vmax:        0.49 m/s AV Area (Vmean):   2.43 cm    PV Peak grad:   1.0 mmHg AV Area (VTI):     2.73 cm    RVOT Peak grad: 1 mmHg AV Vmax:           90.40 cm/s AV Vmean:          61.000 cm/s AV VTI:            0.182 m AV Peak Grad:      3.3 mmHg AV Mean Grad:      2.0 mmHg LVOT Vmax:         80.90 cm/s LVOT Vmean:        47.100 cm/s LVOT VTI:          0.158 m LVOT/AV VTI ratio: 0.87  AORTA Ao Root diam: 2.50 cm TRICUSPID VALVE TR Peak grad:   14.4 mmHg TR Vmax:        190.00 cm/s  SHUNTS Systemic VTI:  0.16 m Systemic Diam: 2.00 cm Ida Rogue MD Electronically signed by Ida Rogue MD Signature Date/Time: 08/01/2019/3:10:13 PM    Final    CT HEAD CODE STROKE WO CONTRAST  Result Date: 08/01/2019 CLINICAL DATA:  Code stroke. Possible stroke; neuro deficit, acute, stroke suspected. Additional history  provided: Left leg weakness beginning 7:30 a.m. EXAM: CT HEAD WITHOUT CONTRAST TECHNIQUE: Contiguous axial images were obtained from the base of the skull through the vertex without intravenous contrast. COMPARISON:  Brain MRI/MRA 10/30/2016. FINDINGS: Brain: Redemonstrated chronic lacunar infarcts within the bilateral basal ganglia, right thalamus and within the pons. Additional patchy ill-defined hypoattenuation within the cerebral white matter is nonspecific, but consistent with chronic small vessel ischemic disease. Stable, mild generalized parenchymal atrophy. There is no acute intracranial hemorrhage. No acute demarcated cortical infarct is identified. No extra-axial fluid collection. No evidence of intracranial mass. No midline shift. Vascular: No definite hyperdense vessel. Atherosclerotic calcifications. Skull: Normal. Negative for fracture or focal lesion. Sinuses/Orbits: Visualized orbits show no acute finding. No significant paranasal sinus disease or mastoid effusion at the imaged levels. These results were called by telephone at the time of interpretation on 08/01/2019 at 12:17 pm to provider PHILLIP STAFFORD , who verbally acknowledged these results. IMPRESSION: No CT evidence of acute intracranial abnormality. Moderate chronic small vessel ischemic disease with redemonstrated chronic lacunar infarcts in the bilateral basal ganglia, right thalamus and within the pons. Stable, mild generalized parenchymal atrophy. Electronically  Signed   By: Kellie Simmering DO   On: 08/01/2019 12:18    (Echo, Carotid, EGD, Colonoscopy, ERCP)    Subjective: Patient seen and examined.  Very tearful, needs to go home.  Has a sick husband to care for at home.  Agreeable to discharge.  Discharge Exam: Vitals:   08/09/19 1140 08/09/19 1335  BP: (!) 142/71 126/62  Pulse: 71 66  Resp: 18 20  Temp: 98.6 F (37 C) 98 F (36.7 C)  SpO2: 96% 90%   Vitals:   08/09/19 0307 08/09/19 0815 08/09/19 1140 08/09/19 1335   BP: (!) 160/81 139/73 (!) 142/71 126/62  Pulse: 71 65 71 66  Resp: 20 18 18 20   Temp: 72.5 F (36.9 C) 99 F (37.2 C) 98.6 F (37 C) 98 F (36.7 C)  TempSrc: Oral Oral Oral Oral  SpO2: 91% 90% 96% 90%  Weight:      Height:        General: Pt is alert, awake, not in acute distress Cardiovascular: RRR, S1/S2 +, no rubs, no gallops Respiratory: CTA bilaterally, no wheezing, no rhonchi Abdominal: Soft, NT, ND, bowel sounds + Extremities: no edema, no cyanosis    The results of significant diagnostics from this hospitalization (including imaging, microbiology, ancillary and laboratory) are listed below for reference.     Microbiology: Recent Results (from the past 240 hour(s))  SARS Coronavirus 2 by RT PCR (hospital order, performed in [facility] hospital lab) Nasopharyngeal Nasopharyngeal Swab     Status: None   Collection Time: 08/01/19 12:24 PM   Specimen: Nasopharyngeal Swab  Result Value Ref Range Status   SARS Coronavirus 2 NEGATIVE NEGATIVE Final    Comment: (NOTE) SARS-CoV-2 target nucleic acids are NOT DETECTED.  The SARS-CoV-2 RNA is generally detectable in upper and lower respiratory specimens during the acute phase of infection. The lowest concentration of SARS-CoV-2 viral copies this assay can detect is 250 copies / mL. A negative result does not preclude SARS-CoV-2 infection and should not be used as the sole basis for treatment or other patient management decisions.  A negative result may occur with improper specimen collection / handling, submission of specimen other than nasopharyngeal swab, presence of viral mutation(s) within the areas targeted by this assay, and inadequate number of viral copies (<250 copies / mL). A negative result must be combined with clinical observations, patient history, and epidemiological information.  Fact Sheet for Patients:   StrictlyIdeas.no  Fact Sheet for Healthcare  Providers: BankingDealers.co.za  This test is not yet approved or  cleared by the [place] FDA and has been authorized for detection and/or diagnosis of SARS-CoV-2 by FDA under an Emergency Use Authorization (EUA).  This EUA will remain in effect (meaning this test can be used) for the duration of the COVID-19 declaration under Section 564(b)(1) of the Act, 21 U.S.C. section 360bbb-3(b)(1), unless the authorization is terminated or revoked sooner.  Performed at [facility], [address] [place], [place] [address]   Blood culture (routine x 2)     Status: None (Preliminary result)   Collection Time: 08/08/19  8:23 AM   Specimen: BLOOD  Result Value Ref Range Status   Specimen Description BLOOD LEFT FORE ARM  Final   Special Requests   Final    BOTTLES DRAWN AEROBIC AND ANAEROBIC Blood Culture adequate volume   Culture   Final    NO GROWTH < 24 HOURS Performed at [facility] [place], [address] [place], [place] [address]  Report Status PENDING  Incomplete  Blood culture (routine x 2)     Status: None (Preliminary result)   Collection Time: 08/08/19  8:23 AM   Specimen: BLOOD  Result Value Ref Range Status   Specimen Description BLOOD LEFT ANTECUBITAL  Final   Special Requests   Final    BOTTLES DRAWN AEROBIC AND ANAEROBIC Blood Culture adequate volume   Culture   Final    NO GROWTH < 24 HOURS Performed at [facility], [address] [place], [place] [address]    Report Status PENDING  Incomplete  Gastrointestinal Panel by PCR , Stool     Status: None   Collection Time: 08/08/19  8:23 AM   Specimen: Stool  Result Value Ref Range Status   Campylobacter species NOT DETECTED NOT DETECTED Final   Plesimonas shigelloides NOT DETECTED NOT DETECTED Final   Salmonella species NOT DETECTED NOT DETECTED Final   Yersinia enterocolitica NOT DETECTED NOT DETECTED Final   Vibrio species NOT DETECTED NOT DETECTED Final    Vibrio cholerae NOT DETECTED NOT DETECTED Final   Enteroaggregative E coli (EAEC) NOT DETECTED NOT DETECTED Final   Enteropathogenic E coli (EPEC) NOT DETECTED NOT DETECTED Final   Enterotoxigenic E coli (ETEC) NOT DETECTED NOT DETECTED Final   Shiga like toxin producing E coli (STEC) NOT DETECTED NOT DETECTED Final   Shigella/Enteroinvasive E coli (EIEC) NOT DETECTED NOT DETECTED Final   Cryptosporidium NOT DETECTED NOT DETECTED Final   Cyclospora cayetanensis NOT DETECTED NOT DETECTED Final   Entamoeba histolytica NOT DETECTED NOT DETECTED Final   Giardia lamblia NOT DETECTED NOT DETECTED Final   Adenovirus F40/41 NOT DETECTED NOT DETECTED Final   Astrovirus NOT DETECTED NOT DETECTED Final   Norovirus GI/GII NOT DETECTED NOT DETECTED Final   Rotavirus A NOT DETECTED NOT DETECTED Final   Sapovirus (I, II, IV, and V) NOT DETECTED NOT DETECTED Final    Comment: Performed at [facility], [address] [place], [place] [address]  C Difficile Quick Screen w PCR reflex     Status: Abnormal   Collection Time: 08/08/19  8:23 AM   Specimen: Stool  Result Value Ref Range Status   C Diff antigen POSITIVE (A) NEGATIVE Final   C Diff toxin NEGATIVE NEGATIVE Final   C Diff interpretation Results are indeterminate. See PCR results.  Final    Comment: Performed at [facility], [address] [place],  [address]  SARS Coronavirus 2 by RT PCR (hospital order, performed in [facility] hospital lab) Nasopharyngeal Nasopharyngeal Swab     Status: None   Collection Time: 08/08/19  8:23 AM   Specimen: Nasopharyngeal Swab  Result Value Ref Range Status   SARS Coronavirus 2 NEGATIVE NEGATIVE Final    Comment: (NOTE) SARS-CoV-2 target nucleic acids are NOT DETECTED.  The SARS-CoV-2 RNA is generally detectable in upper and lower respiratory specimens during the acute phase of infection. The lowest concentration of SARS-CoV-2 viral copies this assay can detect is 250 copies /  mL. A negative result does not preclude SARS-CoV-2 infection and should not be used as the sole basis for treatment or other patient management decisions.  A negative result may occur with improper specimen collection / handling, submission of specimen other than nasopharyngeal swab, presence of viral mutation(s) within the areas targeted by this assay, and inadequate number of viral copies (<250 copies / mL). A negative result must be combined with clinical observations, patient history, and epidemiological information.  Fact Sheet  for Patients:   StrictlyIdeas.no  Fact Sheet for Healthcare Providers: BankingDealers.co.za  This test is not yet approved or  cleared by the [place] FDA and has been authorized for detection and/or diagnosis of SARS-CoV-2 by FDA under an Emergency Use Authorization (EUA).  This EUA will remain in effect (meaning this test can be used) for the duration of the COVID-19 declaration under Section 564(b)(1) of the Act, 21 U.S.C. section 360bbb-3(b)(1), unless the authorization is terminated or revoked sooner.  Performed at [facility], [address] [place], [place] [address]   C. Diff by PCR, Reflexed     Status: Abnormal   Collection Time: 08/08/19  8:23 AM  Result Value Ref Range Status   Toxigenic C. Difficile by PCR POSITIVE (A) NEGATIVE Final    Comment: Positive for toxigenic C. difficile with little to no toxin production. Only treat if clinical presentation suggests symptomatic illness. Performed at [facility], [address] [place], [place] [address]      Labs: BNP (last 3 results) No results for input(s): BNP in the last 8760 hours. Basic Metabolic Panel: Recent Labs  Lab 08/07/19 2228 08/09/19 0050  NA 140 139  K 3.4* 3.5  CL 101 103  CO2 27 27  GLUCOSE 199* 106*  BUN 24* 20  CREATININE 1.41* 1.44*  CALCIUM 10.1 9.0  MG  --  1.6*   Liver Function  Tests: Recent Labs  Lab 08/07/19 2228  AST 26  ALT 21  ALKPHOS 89  BILITOT 0.9  PROT 7.7  ALBUMIN 4.1   Recent Labs  Lab 08/07/19 2228  LIPASE 31   No results for input(s): AMMONIA in the last 168 hours. CBC: Recent Labs  Lab 08/07/19 2228 08/08/19 1019 08/08/19 1730 08/09/19 0050  WBC 18.6* 20.3* 18.3* 17.5*  HGB 17.7* 16.4* 16.1* 15.0  HCT 52.4* 48.6* 45.8 44.2  MCV 95.8 96.4 93.7 96.3  PLT 240 227 199 175   Cardiac Enzymes: No results for input(s): CKTOTAL, CKMB, CKMBINDEX, TROPONINI in the last 168 hours. BNP: Invalid input(s): POCBNP CBG: Recent Labs  Lab 08/08/19 2015 08/09/19 0010 08/09/19 0308 08/09/19 0811 08/09/19 1138  GLUCAP 221* 101* 159* 129* 189*   D-Dimer No results for input(s): DDIMER in the last 72 hours. Hgb A1c No results for input(s): HGBA1C in the last 72 hours. Lipid Profile No results for input(s): CHOL, HDL, LDLCALC, TRIG, CHOLHDL, LDLDIRECT in the last 72 hours. Thyroid function studies No results for input(s): TSH, T4TOTAL, T3FREE, THYROIDAB in the last 72 hours.  Invalid input(s): FREET3 Anemia work up No results for input(s): VITAMINB12, FOLATE, FERRITIN, TIBC, IRON, RETICCTPCT in the last 72 hours. Urinalysis    Component Value Date/Time   COLORURINE YELLOW (A) 08/07/2019 2228   APPEARANCEUR HAZY (A) 08/07/2019 2228   LABSPEC 1.022 08/07/2019 2228   PHURINE 5.0 08/07/2019 2228   GLUCOSEU NEGATIVE 08/07/2019 2228   HGBUR SMALL (A) 08/07/2019 2228   BILIRUBINUR NEGATIVE 08/07/2019 2228   KETONESUR NEGATIVE 08/07/2019 2228   PROTEINUR >=300 (A) 08/07/2019 2228   NITRITE NEGATIVE 08/07/2019 2228   LEUKOCYTESUR NEGATIVE 08/07/2019 2228   Sepsis Labs Invalid input(s): PROCALCITONIN,  WBC,  LACTICIDVEN Microbiology Recent Results (from the past 240 hour(s))  SARS Coronavirus 2 by RT PCR (hospital order, performed in [place] hospital lab) Nasopharyngeal Nasopharyngeal Swab     Status: None   Collection Time:  08/01/19 12:24 PM   Specimen: Nasopharyngeal Swab  Result Value Ref Range Status   SARS Coronavirus  2 NEGATIVE NEGATIVE Final    Comment: (NOTE) SARS-CoV-2 target nucleic acids are NOT DETECTED.  The SARS-CoV-2 RNA is generally detectable in upper and lower respiratory specimens during the acute phase of infection. The lowest concentration of SARS-CoV-2 viral copies this assay can detect is 250 copies / mL. A negative result does not preclude SARS-CoV-2 infection and should not be used as the sole basis for treatment or other patient management decisions.  A negative result may occur with improper specimen collection / handling, submission of specimen other than nasopharyngeal swab, presence of viral mutation(s) within the areas targeted by this assay, and inadequate number of viral copies (<250 copies / mL). A negative result must be combined with clinical observations, patient history, and epidemiological information.  Fact Sheet for Patients:   StrictlyIdeas.no  Fact Sheet for Healthcare Providers: BankingDealers.co.za  This test is not yet approved or  cleared by the [place] FDA and has been authorized for detection and/or diagnosis of SARS-CoV-2 by FDA under an Emergency Use Authorization (EUA).  This EUA will remain in effect (meaning this test can be used) for the duration of the COVID-19 declaration under Section 564(b)(1) of the Act, 21 U.S.C. section 360bbb-3(b)(1), unless the authorization is terminated or revoked sooner.  Performed at [facility], [address] [place], [place] [address]   Blood culture (routine x 2)     Status: None (Preliminary result)   Collection Time: 08/08/19  8:23 AM   Specimen: BLOOD  Result Value Ref Range Status   Specimen Description BLOOD LEFT FORE ARM  Final   Special Requests   Final    BOTTLES DRAWN AEROBIC AND ANAEROBIC Blood Culture adequate volume   Culture    Final    NO GROWTH < 24 HOURS Performed at [facility], [address] [place], [place] [address]    Report Status PENDING  Incomplete  Blood culture (routine x 2)     Status: None (Preliminary result)   Collection Time: 08/08/19  8:23 AM   Specimen: BLOOD  Result Value Ref Range Status   Specimen Description BLOOD LEFT ANTECUBITAL  Final   Special Requests   Final    BOTTLES DRAWN AEROBIC AND ANAEROBIC Blood Culture adequate volume   Culture   Final    NO GROWTH < 24 HOURS Performed at [facility], [address] [place], [place] [address]    Report Status PENDING  Incomplete  Gastrointestinal Panel by PCR , Stool     Status: None   Collection Time: 08/08/19  8:23 AM   Specimen: Stool  Result Value Ref Range Status   Campylobacter species NOT DETECTED NOT DETECTED Final   Plesimonas shigelloides NOT DETECTED NOT DETECTED Final   Salmonella species NOT DETECTED NOT DETECTED Final   Yersinia enterocolitica NOT DETECTED NOT DETECTED Final   Vibrio species NOT DETECTED NOT DETECTED Final   Vibrio cholerae NOT DETECTED NOT DETECTED Final   Enteroaggregative E coli (EAEC) NOT DETECTED NOT DETECTED Final   Enteropathogenic E coli (EPEC) NOT DETECTED NOT DETECTED Final   Enterotoxigenic E coli (ETEC) NOT DETECTED NOT DETECTED Final   Shiga like toxin producing E coli (STEC) NOT DETECTED NOT DETECTED Final   Shigella/Enteroinvasive E coli (EIEC) NOT DETECTED NOT DETECTED Final   Cryptosporidium NOT DETECTED NOT DETECTED Final   Cyclospora cayetanensis NOT DETECTED NOT DETECTED Final   Entamoeba histolytica NOT DETECTED NOT DETECTED Final   Giardia lamblia NOT DETECTED NOT DETECTED Final   Adenovirus  F40/41 NOT DETECTED NOT DETECTED Final   Astrovirus NOT DETECTED NOT DETECTED Final   Norovirus GI/GII NOT DETECTED NOT DETECTED Final   Rotavirus A NOT DETECTED NOT DETECTED Final   Sapovirus (I, II, IV, and V) NOT DETECTED NOT DETECTED Final    Comment: Performed  at [facility], [place], [place] [address]  C Difficile Quick Screen w PCR reflex     Status: Abnormal   Collection Time: 08/08/19  8:23 AM   Specimen: Stool  Result Value Ref Range Status   C Diff antigen POSITIVE (A) NEGATIVE Final   C Diff toxin NEGATIVE NEGATIVE Final   C Diff interpretation Results are indeterminate. See PCR results.  Final    Comment: Performed at [facility], [address] [place], [place] [address]  SARS Coronavirus 2 by RT PCR (hospital order, performed in [facility] hospital lab) Nasopharyngeal Nasopharyngeal Swab     Status: None   Collection Time: 08/08/19  8:23 AM   Specimen: Nasopharyngeal Swab  Result Value Ref Range Status   SARS Coronavirus 2 NEGATIVE NEGATIVE Final    Comment: (NOTE) SARS-CoV-2 target nucleic acids are NOT DETECTED.  The SARS-CoV-2 RNA is generally detectable in upper and lower respiratory specimens during the acute phase of infection. The lowest concentration of SARS-CoV-2 viral copies this assay can detect is 250 copies / mL. A negative result does not preclude SARS-CoV-2 infection and should not be used as the sole basis for treatment or other patient management decisions.  A negative result may occur with improper specimen collection / handling, submission of specimen other than nasopharyngeal swab, presence of viral mutation(s) within the areas targeted by this assay, and inadequate number of viral copies (<250 copies / mL). A negative result must be combined with clinical observations, patient history, and epidemiological information.  Fact Sheet for Patients:   StrictlyIdeas.no  Fact Sheet for Healthcare Providers: BankingDealers.co.za  This test is not yet approved or  cleared by the [place] FDA and has been authorized for detection and/or diagnosis of SARS-CoV-2 by FDA under an Emergency Use Authorization (EUA).  This EUA will  remain in effect (meaning this test can be used) for the duration of the COVID-19 declaration under Section 564(b)(1) of the Act, 21 U.S.C. section 360bbb-3(b)(1), unless the authorization is terminated or revoked sooner.  Performed at [facility], [address], [place], [place] [address]   C. Diff by PCR, Reflexed     Status: Abnormal   Collection Time: 08/08/19  8:23 AM  Result Value Ref Range Status   Toxigenic C. Difficile by PCR POSITIVE (A) NEGATIVE Final    Comment: Positive for toxigenic C. difficile with little to no toxin production. Only treat if clinical presentation suggests symptomatic illness. Performed at [facility], [address], [place], [place] [address]      Time coordinating discharge: Over 30 minutes  SIGNED:   Sidney Ace, MD  [facility] 08/09/2019, 2:52 PM Pager   If 7PM-7AM, please contact night-coverage

## 2019-08-09 NOTE — Discharge Instructions (Signed)
https://www.cdc.gov/cdiff/what-is.html">  Clostridioides Difficile Infection Clostridioides difficile infection, also known as C. difficile or C. diff infection, happens when too much C. diff bacteria grows and causes inflammation of the colon (colitis). It is linked to recent use of antibiotic medicine. This infection can be passed from person to person (is contagious). You may also be exposed to the bacteria in the environment, such as from contact with food, water, or surfaces that have the bacteria on them. What are the causes? Certain bacteria live in the colon and help to digest food. This infection develops when the balance of helpful bacteria in the colon changes and the C. diff bacteria grow out of control. This is caused by taking antibiotics. What increases the risk? The following factors may make you more likely to develop this condition:  Taking certain antibiotics that kill many types of bacteria or taking antibiotics for a long time.  Having an extended stay in health care settings, such as hospitals and long-term care facilities.  Being older than age 65.  Having had a C. diff infection before or a known exposure to C. diff bacteria.  Having a weak disease-fighting system (immune system).  Taking a medicine to reduce stomach acid, such as a proton pump inhibitor, for a long time.  Having a serious underlying condition, such as colon cancer or inflammatory bowel disease (IBD).  Having had a gastrointestinal (GI) tract procedure or surgery. Some people develop this infection even though they do not have any known risk factors. What are the signs or symptoms? Symptoms of this condition include:  Diarrhea (three or more times a day) for several days.  Fever.  Tiredness (fatigue).  Loss of appetite.  Nausea.  Swelling, pain, cramping, or tenderness in the abdomen. How is this diagnosed? This condition is diagnosed with:  Your medical history and a physical  exam.  Tests, which may include: ? A test for C. diff in your stool (feces). ? Blood tests. ? Imaging tests, such as a CT scan of your abdomen.  A procedure in which your health care provider can look inside your colon. This is rare. How is this treated? Treatment for this condition may include:  Stopping the antibiotics that you were taking when the C. diff infection began. Do this only as told by your health care provider.  Taking certain antibiotics to stop C. diff growth.  Taking donor stool from a healthy person and placing it into the colon (fecal transplant). This may be done if the infection keeps coming back.  Having surgery to remove the infected part of the colon. This is rare. Follow these instructions at home: Medicines  Take over-the-counter and prescription medicines only as told by your health care provider.  Take your antibiotic medicine as told by your health care provider. Do not stop taking the antibiotic even if you start to feel better.  Do not treat diarrhea with medicines unless your health care provider tells you to. Eating and drinking   Follow instructions from your health care provider about eating or drinking restrictions.  Eat foods that are bland and easy to digest in small amounts as you can. These foods include bananas, applesauce, rice, lean meats, toast, and crackers.  Follow instructions on how to replace body fluid that has been lost (rehydrate). This may include: ? Drinking clear fluids, such as water, clear fruit juice, and low-calorie sports drinks. ? Sucking on ice chips. ? Taking an oral rehydration solution (ORS). This drink is sold at pharmacies   and retail stores.  Avoid milk, caffeine, and alcohol.  Drink enough fluid to keep your urine pale yellow. Activity  Rest as told by your health care provider.  Return to your normal activities as told by your health care provider. Ask your health care provider what activities are safe  for you. General instructions  Wash your hands often with soap and water for at least 20 seconds. Bathe using soap and water daily.  Be sure your home is clean before you leave the hospital or clinic to go home. Continue daily cleaning for at least a week after going home.  Keep all follow-up visits as told by your health care provider. This is important. How is this prevented? Hand hygiene   Wash your hands thoroughly with soap and water for at least 20 seconds before preparing food and after using the bathroom. Make sure the people you live with also wash their hands often with soap and water for at least 20 seconds.  If you are being treated at a hospital or clinic, make sure that all health care providers and visitors wash their hands with soap and water before touching you. Contact precautions  If you develop diarrhea while in the hospital or a long-term care facility, tell your health care team right away.  When visiting someone in the hospital or a long-term care facility, follow guidelines for wearing a gown, gloves, or other protective equipment.  If possible, avoid contact with people who have diarrhea.  If you are sick and live with other people, use a separate bathroom, if possible. Clean environment  Clean surfaces that are touched often every day. C. diff bacteria are very resistant to cleaning and can live for months on surfaces. The bacteria are killed only by cleaning products containing 10%-solution chlorine bleach. Be sure to: ? Read the manufacturer's instructions or read online resources to find out if the product you are using will work for the surface you are cleaning. ? Clean frequently touched surfaces, such as toilets (remember the flush handle), bathtubs, sinks, door knobs, and work surfaces.  If you are in the hospital, make sure that staff members clean the surfaces in your room daily. Let a staff person know right away if body fluids have splashed or  spilled. Washing clothes and linens  Use a laundry detergent containing chlorine bleach. Be sure to use powder detergent instead of liquid. Only some liquid detergents have bleaching agents that contain chlorine bleach. Powder detergents contain chlorine bleach in low levels to help kill any bacteria.  Run your washing machine on the hot setting once a month without clothes or linens but with enough detergent for washing at full capacity. This will kill any remaining C. diff bacteria. Contact a health care provider if:  Your symptoms do not get better, or they get worse, even with treatment.  Your symptoms go away and then come back.  You have a fever.  You develop new symptoms. Get help right away if:  You have more pain or tenderness in your abdomen.  You have stool that is mostly bloody, or your stool looks dark black and tarry.  You cannot eat or drink without vomiting.  You have signs or symptoms of dehydration, such as: ? Dark urine, very little urine, or no urine. ? Cracked lips or dry mouth. ? Not making tears when you cry. ? Sunken eyes. ? Sleepiness. ? Weakness or dizziness. Summary  Clostridioides difficile, or C. diff, infection happens when too much   C. diff bacteria grows and causes inflammation of the colon (colitis) after antibiotic use.  Symptoms of this infection include diarrhea, fever, tiredness (fatigue), loss of appetite, nausea, and symptoms affecting the abdomen.  This infection may be treated by stopping the antibiotics you were using when the infection began, and then taking certain antibiotics to stop C. diff from growing. Sometimes, fecal transplant or surgery is done.  Handwashing, contact precautions, a clean environment, and washing clothes and linens can help prevent or limit spread of this infection. This information is not intended to replace advice given to you by your health care provider. Make sure you discuss any questions you have with your  health care provider. Document Revised: 07/04/2018 Document Reviewed: 07/04/2018 Elsevier Patient Education  2020 [employer]  

## 2019-08-13 LAB — CULTURE, BLOOD (ROUTINE X 2)
Culture: NO GROWTH
Culture: NO GROWTH
Special Requests: ADEQUATE
Special Requests: ADEQUATE

## 2019-08-14 NOTE — Progress Notes (Signed)
I,[name] L Chambers,acting as a scribe for Lelon Huh, MD.,have documented all relevant documentation on the behalf of Lelon Huh, MD,as directed by  Lelon Huh, MD while in the presence of Lelon Huh, MD.   Established patient visit   Patient: Hannah Kirby   DOB: 28-Jul-1945   74 y.o. Female  MRN: [id number] Visit Date: 08/16/2019  Today's healthcare provider: Lelon Huh, MD   Chief Complaint  Patient presents with  . Hospitalization Follow-up   Subjective    HPI Follow up Hospitalization  Patient was admitted to [facility] on 08/08/2019 and discharged on 08/09/2019. She was treated for colitis, rectal bleeding and dehydration. She had previously been admitted 6/10/201 for CVA with left sided weakness when she was started on clopidogrel. She was at her neurology follow up on 6/16 with Dr. Melrose Nakayama when she was sent to the ER for colitis, so has not had formal follow up with neurology yet.  Treatment for colitis included prescribing additional 9 days of Vancomycin 125 mg every 6 hours.  Discharge literature provided on C. difficile.  Recommend the patient hold the Plavix for additional 2 days and resume on 08/11/2019 if her rectal bleeding resolves. Patient was advised to follow up with PCP in 1-2 weeks after discharge.  Telephone follow up was not done.  She reports good compliance with treatment. She reports this condition is improved. Patient states the rectal bleeding has resolved.   She continues to have weakness of left upper and lower extremities since CVA. She is able to ambulate but uses a walker. She has not yet started physical therapy.  ----------------------------------------------------------------------------------------- -       Medications: Outpatient Medications Prior to Visit  Medication Sig  . acetaminophen (TYLENOL) 500 MG tablet Take 500 mg by mouth every 6 (six) hours as needed.  Marland Kitchen albuterol (VENTOLIN HFA) 108 (90 Base) MCG/ACT inhaler  Inhale 2 puffs into the lungs every 4 (four) hours as needed for wheezing or shortness of breath.  Marland Kitchen amLODipine (NORVASC) 5 MG tablet Take 1 tablet (5 mg total) by mouth daily.  Marland Kitchen aspirin (ASPIR-81) 81 MG EC tablet Take 81 mg by mouth daily.    . clopidogrel (PLAVIX) 75 MG tablet Take 1 tablet (75 mg total) by mouth daily.  . cyanocobalamin 1000 MCG tablet Take 1,000 mcg by mouth daily.  Marland Kitchen ezetimibe (ZETIA) 10 MG tablet Take 1 tablet (10 mg total) by mouth daily.  . fluticasone furoate-vilanterol (BREO ELLIPTA) 100-25 MCG/INH AEPB Inhale 1 puff into the lungs daily.  Marland Kitchen HYDROcodone-acetaminophen (NORCO/VICODIN) 5-325 MG tablet Take 1 tablet by mouth every 6 (six) hours as needed.  . insulin glargine (LANTUS) 100 UNIT/ML injection Inject 22 Units into the skin daily.   . Liraglutide (VICTOZA) 18 MG/3ML SOLN injection Inject 1.8 mg into the skin daily.   Marland Kitchen lisinopril (ZESTRIL) 20 MG tablet Take 1 tablet (20 mg total) by mouth daily.  . metoprolol tartrate (LOPRESSOR) 100 MG tablet Take 1 tablet (100 mg total) by mouth 2 (two) times daily.  . Multiple Vitamins-Minerals (DAILY MULTI) TABS Take 1 tablet by mouth daily.   . nitroGLYCERIN (NITROSTAT) 0.4 MG SL tablet Place 1 tablet (0.4 mg total) under the tongue every 5 (five) minutes as needed.  . Omega-3 Fatty Acids (FISH OIL) 1000 MG CAPS Take 2 capsules by mouth in the morning and at bedtime.   . ondansetron (ZOFRAN ODT) 4 MG disintegrating tablet Take 1 tablet (4 mg total) by mouth every 8 (eight) hours  as needed for nausea or vomiting.  . RABEprazole (ACIPHEX) 20 MG tablet Take 20 mg by mouth daily. 15 Mins. before evening meal  . rosuvastatin (CRESTOR) 10 MG tablet Take 1 tablet (10 mg total) by mouth daily.  . sucralfate (CARAFATE) 1 g tablet Take 1 g by mouth 2 (two) times daily. 15 Minutes before evening meal and at bed time  . tiotropium (SPIRIVA) 18 MCG inhalation capsule Place 1 capsule (18 mcg total) into inhaler and inhale daily.  .  vancomycin (VANCOCIN HCL) 125 MG capsule Take 1 capsule (125 mg total) by mouth 4 (four) times daily for 9 days.   No facility-administered medications prior to visit.    Review of Systems  Constitutional: Negative for appetite change, chills, fatigue and fever.  Respiratory: Negative for chest tightness and shortness of breath.   Cardiovascular: Negative for chest pain and palpitations.  Gastrointestinal: Negative for abdominal pain, nausea and vomiting.  Neurological: Negative for dizziness and weakness.       Objective    BP 122/70 (BP Location: Left Arm, Patient Position: Sitting, Cuff Size: Large)   Pulse 63   Temp (!) 97.5 F (36.4 C) (Temporal)   Resp 16   Wt 175 lb (79.4 kg)   SpO2 95% Comment: room air  BMI 29.12 kg/m     Physical Exam   General: Appearance:     Overweight female in no acute distress  Eyes:    PERRL, conjunctiva/corneas clear, EOM's intact       Lungs:     Clear to auscultation bilaterally, respirations unlabored  Heart:    Normal heart rate. Normal rhythm. No murmurs, rubs, or gallops.   MS:   All extremities are intact.   Neurologic:   Awake, alert, oriented x 3. +4 LUE and LLE strength, +2 reflexes.  +5 RUE and RLE strength and +1 reflexes        No results found for any visits on 08/16/19.  Assessment & Plan     1. Late effects of CVA (cerebrovascular accident) Neurology follow up scheduled 08-27-2019. Still some residual left sided weakness. Tolerating ASA and clopidogrel with no abnormal bleeding.   2. Weakness of left lower extremity - Ambulatory referral to Physical Therapy  3. Weakness of left upper extremity  - Ambulatory referral to Occupational Therapy  4. Spinal stenosis of lumbar region, unspecified whether neurogenic claudication present Stable on current regiment of - HYDROcodone-acetaminophen (NORCO/VICODIN) 5-325 MG tablet; Take 1 tablet by mouth every 6 (six) hours as needed.  Dispense: 120 tablet; Refill: 0  5.  Chronic, continuous use of opioids  No sign of abuse or diversion  6. 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 Continue current medications.    7. C. difficile colitis Symptoms current resolved, is nearly finished with vancomycin. Advised to call if she has any recurrence of diarrhea or blood in stool.  - Ambulatory referral to Gastroenterology   No follow-ups on file.      The entirety of the information documented in the History of Present Illness, Review of Systems and Physical Exam were personally obtained by me. Portions of this information were initially documented by the CMA and reviewed by me for thoroughness and accuracy.    Addressed extensive list of chronic and acute medical problems today requiring 45 minutes reviewing her medical record, counseling patient regarding her conditions and coordination of care.      Lelon Huh, MD  [facility] [phone] (phone) [phone] (fax)  [place]

## 2019-08-16 ENCOUNTER — Ambulatory Visit (INDEPENDENT_AMBULATORY_CARE_PROVIDER_SITE_OTHER): Payer: Medicare Other | Admitting: Family Medicine

## 2019-08-16 ENCOUNTER — Encounter: Payer: Self-pay | Admitting: Family Medicine

## 2019-08-16 ENCOUNTER — Other Ambulatory Visit: Payer: Self-pay

## 2019-08-16 VITALS — BP 122/70 | HR 63 | Temp 97.5°F | Resp 16 | Wt 175.0 lb

## 2019-08-16 DIAGNOSIS — I129 Hypertensive chronic kidney disease with stage 1 through stage 4 chronic kidney disease, or unspecified chronic kidney disease: Secondary | ICD-10-CM

## 2019-08-16 DIAGNOSIS — F1190 Opioid use, unspecified, uncomplicated: Secondary | ICD-10-CM | POA: Diagnosis not present

## 2019-08-16 DIAGNOSIS — M48061 Spinal stenosis, lumbar region without neurogenic claudication: Secondary | ICD-10-CM | POA: Diagnosis not present

## 2019-08-16 DIAGNOSIS — I6990 Unspecified sequelae of unspecified cerebrovascular disease: Secondary | ICD-10-CM

## 2019-08-16 DIAGNOSIS — I639 Cerebral infarction, unspecified: Secondary | ICD-10-CM | POA: Diagnosis not present

## 2019-08-16 DIAGNOSIS — A0472 Enterocolitis due to Clostridium difficile, not specified as recurrent: Secondary | ICD-10-CM

## 2019-08-16 DIAGNOSIS — R29898 Other symptoms and signs involving the musculoskeletal system: Secondary | ICD-10-CM

## 2019-08-16 MED ORDER — HYDROCODONE-ACETAMINOPHEN 5-325 MG PO TABS: 1.0000 | ORAL_TABLET | Freq: Four times a day (QID) | ORAL | 0 refills | Status: DC | PRN

## 2019-09-16 ENCOUNTER — Encounter: Payer: Self-pay | Admitting: Family Medicine

## 2019-09-16 DIAGNOSIS — F1190 Opioid use, unspecified, uncomplicated: Secondary | ICD-10-CM

## 2019-09-16 DIAGNOSIS — M48061 Spinal stenosis, lumbar region without neurogenic claudication: Secondary | ICD-10-CM

## 2019-09-16 MED ORDER — HYDROCODONE-ACETAMINOPHEN 5-325 MG PO TABS: 1.0000 | ORAL_TABLET | Freq: Four times a day (QID) | ORAL | 0 refills | Status: DC | PRN

## 2019-09-19 MED ORDER — EZETIMIBE 10 MG PO TABS: 10.0000 mg | ORAL_TABLET | Freq: Every day | ORAL | 1 refills | Status: DC

## 2019-09-26 ENCOUNTER — Other Ambulatory Visit: Payer: Self-pay

## 2019-09-26 ENCOUNTER — Other Ambulatory Visit
Admission: RE | Admit: 2019-09-26 | Discharge: 2019-09-26 | Disposition: A | Discharge status: Home / Self Care | Payer: Medicare Other | Attending: Gastroenterology | Admitting: Gastroenterology

## 2019-09-26 DIAGNOSIS — A0472 Enterocolitis due to Clostridium difficile, not specified as recurrent: Secondary | ICD-10-CM

## 2019-10-17 ENCOUNTER — Encounter: Payer: Self-pay | Admitting: Family Medicine

## 2019-10-18 ENCOUNTER — Other Ambulatory Visit: Payer: Self-pay

## 2019-10-18 DIAGNOSIS — M48061 Spinal stenosis, lumbar region without neurogenic claudication: Secondary | ICD-10-CM

## 2019-10-18 DIAGNOSIS — F1190 Opioid use, unspecified, uncomplicated: Secondary | ICD-10-CM

## 2019-10-18 MED ORDER — HYDROCODONE-ACETAMINOPHEN 5-325 MG PO TABS: 1.0000 | ORAL_TABLET | Freq: Four times a day (QID) | ORAL | 0 refills | Status: DC | PRN

## 2019-10-18 NOTE — Telephone Encounter (Signed)
Patient sent mychart message requesting refill.

## 2019-10-23 ENCOUNTER — Encounter: Payer: Self-pay | Admitting: Gastroenterology

## 2019-10-23 ENCOUNTER — Other Ambulatory Visit: Payer: Self-pay

## 2019-10-23 ENCOUNTER — Ambulatory Visit (INDEPENDENT_AMBULATORY_CARE_PROVIDER_SITE_OTHER): Payer: Medicare Other | Admitting: Gastroenterology

## 2019-10-23 VITALS — BP 143/84 | HR 68 | Temp 97.0°F | Ht 65.0 in | Wt 173.6 lb

## 2019-10-23 DIAGNOSIS — Z86010 Personal history of colonic polyps: Secondary | ICD-10-CM

## 2019-10-23 DIAGNOSIS — I639 Cerebral infarction, unspecified: Secondary | ICD-10-CM | POA: Diagnosis not present

## 2019-10-23 DIAGNOSIS — K591 Functional diarrhea: Secondary | ICD-10-CM

## 2019-10-23 MED ORDER — SUCRALFATE 1 G PO TABS: 1.0000 g | ORAL_TABLET | Freq: Two times a day (BID) | ORAL | 3 refills | Status: DC

## 2019-10-23 MED ORDER — RABEPRAZOLE SODIUM 20 MG PO TBEC: 20.0000 mg | DELAYED_RELEASE_TABLET | Freq: Every day | ORAL | 3 refills | Status: DC

## 2019-10-23 NOTE — H&P (View-Only) (Signed)
Primary Care Physician: Birdie Sons, MD  Primary Gastroenterologist:  Dr. Lucilla Lame  Chief Complaint  Patient presents with  . New Patient (Initial Visit)    C-diff infection    HPI: Hannah Kirby is a 74 y.o. female here with a history of C. Difficile colitis.  This patient has been followed by [facility] clinic for many years with multiple EGDs and colonoscopies.  The patient's last colonoscopy was reported to have a 28 mm polyp in the transverse colon that was tattooed.The lesion was found to be a adenoma.  The patient was recently in the hospital back in June for C. Difficile colitis and I was consults at that time. The patient has also had multiple endoscopies with the last one being in March 2020 for Barrett's esophagus.  At that time she was also noted to have a hiatal hernia. It appears on August 5 the patient was sent for a test for C. Difficile due to diarrhea.There does not seem to be any results of that test in the chart.  The patient reports that she didn't get the stool test because her diarrhea will come and go with abdominal cramps.  There is no report of any unexplained weight loss fevers chills black stools or bloody stools.  She also denies any chronic abdominal pain.  Past Medical History:  Diagnosis Date  . Anemia   . Arthritis   . CAD (coronary artery disease)    a. cath 02/2006: BMS x 2 to RCA, cath o/w without significant coronary disease; b. nuclear stress test 07/2014: no signs of ischemia, no ekg changes concerning for ischemia, low risk study/normal study  . Chronic bronchitis ([place])    secondary to cigarette smoking  . Chronic kidney disease (CKD), stage III (moderate)   . COPD (chronic obstructive pulmonary disease) ([place])   . Diabetes mellitus   . FHx: allergies   . GERD (gastroesophageal reflux disease)   . Goiter   . Granulomatous disease ([place])   . Hernia   . Hyperlipidemia   . Hypertension   . Kidney stone on left side 2013  .  Microalbuminuria   . Obesity   . Smokers' cough ([place])   . Spinal stenosis   . Stroke ([place]) 10/29/2016   mild left side weakness  . Stroke [facility]) 08/01/2019    Current Outpatient Medications  Medication Sig Dispense Refill  . acetaminophen (TYLENOL) 500 MG tablet Take 500 mg by mouth every 6 (six) hours as needed.    Marland Kitchen albuterol (VENTOLIN HFA) 108 (90 Base) MCG/ACT inhaler Inhale 2 puffs into the lungs every 4 (four) hours as needed for wheezing or shortness of breath. 48 g 3  . amLODipine (NORVASC) 5 MG tablet Take 1 tablet (5 mg total) by mouth daily. 90 tablet 3  . aspirin (ASPIR-81) 81 MG EC tablet Take 81 mg by mouth daily.      . clopidogrel (PLAVIX) 75 MG tablet Take 1 tablet (75 mg total) by mouth daily. 90 tablet 3  . cyanocobalamin 1000 MCG tablet Take 1,000 mcg by mouth daily.    Marland Kitchen ezetimibe (ZETIA) 10 MG tablet Take 1 tablet (10 mg total) by mouth daily. 90 tablet 1  . fluticasone furoate-vilanterol (BREO ELLIPTA) 100-25 MCG/INH AEPB Inhale 1 puff into the lungs daily. 84 each 3  . HYDROcodone-acetaminophen (NORCO/VICODIN) 5-325 MG tablet Take 1 tablet by mouth every 6 (six) hours as needed. 120 tablet 0  . insulin glargine (LANTUS) 100 UNIT/ML injection Inject 22  Units into the skin daily.     . Liraglutide (VICTOZA) 18 MG/3ML SOLN injection Inject 1.8 mg into the skin daily.     Marland Kitchen lisinopril (ZESTRIL) 20 MG tablet Take 1 tablet (20 mg total) by mouth daily. 90 tablet 3  . metoprolol tartrate (LOPRESSOR) 100 MG tablet Take 1 tablet (100 mg total) by mouth 2 (two) times daily. 180 tablet 3  . Multiple Vitamins-Minerals (DAILY MULTI) TABS Take 1 tablet by mouth daily.     . nitroGLYCERIN (NITROSTAT) 0.4 MG SL tablet Place 1 tablet (0.4 mg total) under the tongue every 5 (five) minutes as needed. 25 tablet 6  . Omega-3 Fatty Acids (FISH OIL) 1000 MG CAPS Take 2 capsules by mouth in the morning and at bedtime.     . ondansetron (ZOFRAN ODT) 4 MG disintegrating tablet Take 1 tablet  (4 mg total) by mouth every 8 (eight) hours as needed for nausea or vomiting. 20 tablet 0  . RABEprazole (ACIPHEX) 20 MG tablet Take 20 mg by mouth daily. 15 Mins. before evening meal    . rosuvastatin (CRESTOR) 10 MG tablet Take 1 tablet (10 mg total) by mouth daily. 90 tablet 3  . sucralfate (CARAFATE) 1 g tablet Take 1 g by mouth 2 (two) times daily. 15 Minutes before evening meal and at bed time    . tiotropium (SPIRIVA) 18 MCG inhalation capsule Place 1 capsule (18 mcg total) into inhaler and inhale daily. 90 capsule 4   No current facility-administered medications for this visit.    Allergies as of 10/23/2019 - Review Complete 10/23/2019  Allergen Reaction Noted  . Spironolactone Shortness Of Breath 10/05/2018  . Sulfonamide derivatives Other (See Comments)   . Other Other (See Comments) 04/27/2015  . Sulfa antibiotics Other (See Comments) 10/22/2013  . Codeine Nausea And Vomiting and Other (See Comments) 04/27/2015  . Prednisone Palpitations and Other (See Comments) 10/04/2011    ROS:  General: Negative for anorexia, weight loss, fever, chills, fatigue, weakness. ENT: Negative for hoarseness, difficulty swallowing , nasal congestion. CV: Negative for chest pain, angina, palpitations, dyspnea on exertion, peripheral edema.  Respiratory: Negative for dyspnea at rest, dyspnea on exertion, cough, sputum, wheezing.  GI: See history of present illness. GU:  Negative for dysuria, hematuria, urinary incontinence, urinary frequency, nocturnal urination.  Endo: Negative for unusual weight change.    Physical Examination:   BP (!) 143/84   Pulse 68   Temp (!) 97 F (36.1 C) (Temporal)   Ht 5\' 5"  (1.651 m)   Wt 173 lb 9.6 oz (78.7 kg)   BMI 28.89 kg/m   General: Well-nourished, well-developed in no acute distress.  Eyes: No icterus. Conjunctivae pink. Lungs: Clear to auscultation bilaterally. Non-labored. Heart: Regular rate and rhythm, no murmurs rubs or gallops.  Abdomen:  Bowel sounds are normal, nontender, nondistended, no hepatosplenomegaly or masses, no abdominal bruits or hernia , no rebound or guarding.   Extremities: No lower extremity edema. No clubbing or deformities. Neuro: Alert and oriented x 3.  Grossly intact. Skin: Warm and dry, no jaundice.   Psych: Alert and cooperative, normal mood and affect.  Labs:    Imaging Studies: No results found.  Assessment and Plan:   Hannah Kirby is a 74 y.o. y/o female who comes in today with a history of C. Difficile colitis and has been doing well. Her main issue right now is that she has bouts of diarrhea with abdominal discomfort.  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  The patient has been told to start a probiotic and she has been given a prescription for hyoscyamine to be taken sublingually when she has her abdominal cramps.  The patient will also be set up for colonoscopy due to her last colonoscopy showing a reported 28 mm polyp in the transverse colon that was removed and the area was tattooed.  She has had no further follow-up of that polyp since then. The patient has been explained the plan and agrees with it.     Lucilla Lame, MD. [facility]    Note: This dictation was prepared with Dragon dictation along with smaller phrase technology. Any transcriptional errors that result from this process are unintentional.

## 2019-10-23 NOTE — Progress Notes (Signed)
Primary Care Physician: Birdie Sons, MD  Primary Gastroenterologist:  Dr. Lucilla Lame  Chief Complaint  Patient presents with  . New Patient (Initial Visit)    C-diff infection    HPI: Hannah Kirby is a 74 y.o. female here with a history of C. Difficile colitis.  This patient has been followed by [facility] clinic for many years with multiple EGDs and colonoscopies.  The patient's last colonoscopy was reported to have a 28 mm polyp in the transverse colon that was tattooed.The lesion was found to be a adenoma.  The patient was recently in the hospital back in June for C. Difficile colitis and I was consults at that time. The patient has also had multiple endoscopies with the last one being in March 2020 for Barrett's esophagus.  At that time she was also noted to have a hiatal hernia. It appears on August 5 the patient was sent for a test for C. Difficile due to diarrhea.There does not seem to be any results of that test in the chart.  The patient reports that she didn't get the stool test because her diarrhea will come and go with abdominal cramps.  There is no report of any unexplained weight loss fevers chills black stools or bloody stools.  She also denies any chronic abdominal pain.  Past Medical History:  Diagnosis Date  . Anemia   . Arthritis   . CAD (coronary artery disease)    a. cath 02/2006: BMS x 2 to RCA, cath o/w without significant coronary disease; b. nuclear stress test 07/2014: no signs of ischemia, no ekg changes concerning for ischemia, low risk study/normal study  . Chronic bronchitis ([place])    secondary to cigarette smoking  . Chronic kidney disease (CKD), stage III (moderate)   . COPD (chronic obstructive pulmonary disease) ([place])   . Diabetes mellitus   . FHx: allergies   . GERD (gastroesophageal reflux disease)   . Goiter   . Granulomatous disease ([place])   . Hernia   . Hyperlipidemia   . Hypertension   . Kidney stone on left side 2013  .  Microalbuminuria   . Obesity   . Smokers' cough ([place])   . Spinal stenosis   . Stroke ([place]) 10/29/2016   mild left side weakness  . Stroke [facility]) 08/01/2019    Current Outpatient Medications  Medication Sig Dispense Refill  . acetaminophen (TYLENOL) 500 MG tablet Take 500 mg by mouth every 6 (six) hours as needed.    Marland Kitchen albuterol (VENTOLIN HFA) 108 (90 Base) MCG/ACT inhaler Inhale 2 puffs into the lungs every 4 (four) hours as needed for wheezing or shortness of breath. 48 g 3  . amLODipine (NORVASC) 5 MG tablet Take 1 tablet (5 mg total) by mouth daily. 90 tablet 3  . aspirin (ASPIR-81) 81 MG EC tablet Take 81 mg by mouth daily.      . clopidogrel (PLAVIX) 75 MG tablet Take 1 tablet (75 mg total) by mouth daily. 90 tablet 3  . cyanocobalamin 1000 MCG tablet Take 1,000 mcg by mouth daily.    Marland Kitchen ezetimibe (ZETIA) 10 MG tablet Take 1 tablet (10 mg total) by mouth daily. 90 tablet 1  . fluticasone furoate-vilanterol (BREO ELLIPTA) 100-25 MCG/INH AEPB Inhale 1 puff into the lungs daily. 84 each 3  . HYDROcodone-acetaminophen (NORCO/VICODIN) 5-325 MG tablet Take 1 tablet by mouth every 6 (six) hours as needed. 120 tablet 0  . insulin glargine (LANTUS) 100 UNIT/ML injection Inject 22  Units into the skin daily.     . Liraglutide (VICTOZA) 18 MG/3ML SOLN injection Inject 1.8 mg into the skin daily.     Marland Kitchen lisinopril (ZESTRIL) 20 MG tablet Take 1 tablet (20 mg total) by mouth daily. 90 tablet 3  . metoprolol tartrate (LOPRESSOR) 100 MG tablet Take 1 tablet (100 mg total) by mouth 2 (two) times daily. 180 tablet 3  . Multiple Vitamins-Minerals (DAILY MULTI) TABS Take 1 tablet by mouth daily.     . nitroGLYCERIN (NITROSTAT) 0.4 MG SL tablet Place 1 tablet (0.4 mg total) under the tongue every 5 (five) minutes as needed. 25 tablet 6  . Omega-3 Fatty Acids (FISH OIL) 1000 MG CAPS Take 2 capsules by mouth in the morning and at bedtime.     . ondansetron (ZOFRAN ODT) 4 MG disintegrating tablet Take 1 tablet  (4 mg total) by mouth every 8 (eight) hours as needed for nausea or vomiting. 20 tablet 0  . RABEprazole (ACIPHEX) 20 MG tablet Take 20 mg by mouth daily. 15 Mins. before evening meal    . rosuvastatin (CRESTOR) 10 MG tablet Take 1 tablet (10 mg total) by mouth daily. 90 tablet 3  . sucralfate (CARAFATE) 1 g tablet Take 1 g by mouth 2 (two) times daily. 15 Minutes before evening meal and at bed time    . tiotropium (SPIRIVA) 18 MCG inhalation capsule Place 1 capsule (18 mcg total) into inhaler and inhale daily. 90 capsule 4   No current facility-administered medications for this visit.    Allergies as of 10/23/2019 - Review Complete 10/23/2019  Allergen Reaction Noted  . Spironolactone Shortness Of Breath 10/05/2018  . Sulfonamide derivatives Other (See Comments)   . Other Other (See Comments) 04/27/2015  . Sulfa antibiotics Other (See Comments) 10/22/2013  . Codeine Nausea And Vomiting and Other (See Comments) 04/27/2015  . Prednisone Palpitations and Other (See Comments) 10/04/2011    ROS:  General: Negative for anorexia, weight loss, fever, chills, fatigue, weakness. ENT: Negative for hoarseness, difficulty swallowing , nasal congestion. CV: Negative for chest pain, angina, palpitations, dyspnea on exertion, peripheral edema.  Respiratory: Negative for dyspnea at rest, dyspnea on exertion, cough, sputum, wheezing.  GI: See history of present illness. GU:  Negative for dysuria, hematuria, urinary incontinence, urinary frequency, nocturnal urination.  Endo: Negative for unusual weight change.    Physical Examination:   BP (!) 143/84   Pulse 68   Temp (!) 97 F (36.1 C) (Temporal)   Ht 5\' 5"  (1.651 m)   Wt 173 lb 9.6 oz (78.7 kg)   BMI 28.89 kg/m   General: Well-nourished, well-developed in no acute distress.  Eyes: No icterus. Conjunctivae pink. Lungs: Clear to auscultation bilaterally. Non-labored. Heart: Regular rate and rhythm, no murmurs rubs or gallops.  Abdomen:  Bowel sounds are normal, nontender, nondistended, no hepatosplenomegaly or masses, no abdominal bruits or hernia , no rebound or guarding.   Extremities: No lower extremity edema. No clubbing or deformities. Neuro: Alert and oriented x 3.  Grossly intact. Skin: Warm and dry, no jaundice.   Psych: Alert and cooperative, normal mood and affect.  Labs:    Imaging Studies: No results found.  Assessment and Plan:   Hannah Kirby is a 74 y.o. y/o female who comes in today with a history of C. Difficile colitis and has been doing well. Her main issue right now is that she has bouts of diarrhea with abdominal discomfort.  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  The patient has been told to start a probiotic and she has been given a prescription for hyoscyamine to be taken sublingually when she has her abdominal cramps.  The patient will also be set up for colonoscopy due to her last colonoscopy showing a reported 28 mm polyp in the transverse colon that was removed and the area was tattooed.  She has had no further follow-up of that polyp since then. The patient has been explained the plan and agrees with it.     Lucilla Lame, MD. [facility]    Note: This dictation was prepared with Dragon dictation along with smaller phrase technology. Any transcriptional errors that result from this process are unintentional.

## 2019-10-31 NOTE — Progress Notes (Signed)
Ok to hold from cardiac perspective 5 days Would check with neurolgy  Research scientist (life sciences)) as she is on plavix for CVAs Would stay on asa 81 daily

## 2019-11-01 ENCOUNTER — Other Ambulatory Visit: Payer: Self-pay

## 2019-11-01 ENCOUNTER — Telehealth: Payer: Self-pay

## 2019-11-01 NOTE — Telephone Encounter (Signed)
Advised pt per Dr. [name] below. Will send request to Dr. Melrose Nakayama as well for his approval. Pt verbalized understanding.   Ok to hold from cardiac perspective 5 days Would check with neurolgy  Research scientist (life sciences)) as she is on plavix for CVAs. Would stay on asa 81 daily

## 2019-11-01 NOTE — Telephone Encounter (Signed)
-----   Message from Minna Merritts, MD sent at 10/31/2019  9:32 PM EDT -----   ----- Message ----- From: [place]ie [place], CMA Sent: 10/23/2019   4:02 PM EDT To: Minna Merritts, MD

## 2019-11-06 ENCOUNTER — Ambulatory Visit (INDEPENDENT_AMBULATORY_CARE_PROVIDER_SITE_OTHER): Payer: Medicare Other | Admitting: Cardiovascular Disease

## 2019-11-06 ENCOUNTER — Encounter: Payer: Self-pay | Admitting: Cardiovascular Disease

## 2019-11-06 ENCOUNTER — Other Ambulatory Visit: Payer: Self-pay

## 2019-11-06 VITALS — BP 138/90 | HR 70 | Ht 65.0 in | Wt 172.0 lb

## 2019-11-06 DIAGNOSIS — I639 Cerebral infarction, unspecified: Secondary | ICD-10-CM | POA: Diagnosis not present

## 2019-11-06 DIAGNOSIS — I25118 Atherosclerotic heart disease of native coronary artery with other forms of angina pectoris: Secondary | ICD-10-CM

## 2019-11-06 MED ORDER — CLOPIDOGREL BISULFATE 75 MG PO TABS
75.0000 mg | ORAL_TABLET | Freq: Every day | ORAL | 3 refills | Status: DC
Start: 2019-11-06 — End: 2020-12-21

## 2019-11-06 MED ORDER — EZETIMIBE 10 MG PO TABS: 10.0000 mg | ORAL_TABLET | Freq: Every day | ORAL | 3 refills | Status: DC

## 2019-11-06 MED ORDER — METOPROLOL TARTRATE 100 MG PO TABS
100.0000 mg | ORAL_TABLET | Freq: Two times a day (BID) | ORAL | 3 refills | Status: DC
Start: 2019-11-06 — End: 2020-12-21

## 2019-11-06 MED ORDER — LISINOPRIL 20 MG PO TABS: 20.0000 mg | ORAL_TABLET | Freq: Every day | ORAL | 3 refills | Status: DC

## 2019-11-06 MED ORDER — ROSUVASTATIN CALCIUM 10 MG PO TABS: 10.0000 mg | ORAL_TABLET | Freq: Every day | ORAL | 3 refills | Status: DC

## 2019-11-06 NOTE — Patient Instructions (Signed)
Medication Instructions:  No changes  If you need a refill on your cardiac medications before your next appointment, please call your pharmacy.    Lab work: No new labs needed   If you have labs (blood work) drawn today and your tests are completely normal, you will receive your results only by: . MyChart Message (if you have MyChart) OR . A paper copy in the mail If you have any lab test that is abnormal or we need to change your treatment, we will call you to review the results.   Testing/Procedures: No new testing needed   Follow-Up: At [facility], you and your health needs are our priority.  As part of our continuing mission to provide you with exceptional heart care, we have created designated Provider Care Teams.  These Care Teams include your primary Cardiologist (physician) and Advanced Practice Providers (APPs -  Physician Assistants and Nurse Practitioners) who all work together to provide you with the care you need, when you need it.  . You will need a follow up appointment in 6 months  . Providers on your designated Care Team:   . Christopher Berge, NP . Ryan Dunn, PA-C . Jacquelyn Visser, PA-C  Any Other Special Instructions Will Be Listed Below (If Applicable).  COVID-19 Vaccine Information can be found at: [email or web address] For questions related to vaccine distribution or appointments, please email [email or web address] or call [phone].     

## 2019-11-06 NOTE — Progress Notes (Signed)
Date:  11/06/2019   ID:  Hannah Kirby, DOB 1945-11-17, MRN [id number]  Patient Location:  [address] [place] [address]   Provider location:   [facility], [place] office  PCP:  Birdie Sons, MD  Cardiologist:  Arvid Right [facility]   Chief Complaint  Patient presents with  . OTHER    3 month f/u c/o low BP. Meds reviewed verbally with pt.     History of Present Illness:    Hannah Kirby is a 74 y.o. female past medical history of diabetes,  obesity,  Anxiety, panic attacks significantly improved with  weight loss on Victoza,  coronary artery disease with stenting of her RCA in January 2008,  long smoking history who continues to smoke,  hyperlipidemia  outbreak of her granulomatous disease in summary 2013, prednisone for this condition.  History pinched nerve,  on pain medication. Chronic constipation Chronic shortness of breath, COPD Stroke 2018, June 2021, discharged on aspirin Plavix,  previous strokes on MRI who presents for routine followup of her coronary artery disease and hypertension.  Discussion of recent events with her, In hospital July 2021  with bloody diarrhea, leukocytosis C.diff: Positive Treated with vancomycin  No diarrhea in 3 weeks On probiotics, feels better See Dr. Allen Norris  Poor diet, eats out at restaurants Sugar candy, butterscotch  Long history of smoking, continues to smoke Chronic shortness of breath, deconditioning  Off amlodipine, blood pressure was running too low At her request we will take this off her list  Chronic shortness of breath, sedentary Some gait instability  Lab work reviewed CR 1.4   two months ago Followed by nephrology creatinine up to 1.64 potassium 3.3  EKG personally reviewed by myself on todays visit Shows normal sinus rhythm rate 70 bpm nonspecific ST abnormality  Other past medical history reviewed In 2020, torn retina, symptoms started with floaters and  vision deficits  had laser treatment, and follow-up with [facility]  CT head: No CT evidence of acute intracranial abnormality. Moderate chronic small vessel ischemic disease with redemonstrated chronic lacunar infarcts in the bilateral basal ganglia, right thalamus and within the pons. Stable, mild generalized parenchymal atrophy.  MRI 1. 8 mm acute lacunar infarct within the right thalamocapsular junction. 2. An additional small acute white matter infarct is questioned within the left corona radiata, as described.  Stress test 11/2017  The study is normal.  This is a low risk study.  The left ventricular ejection fraction is normal (55-65%).  Previously had side effects on spironolactone, palpitations  On aspirin Plavix for prior stroke 2018  In the ER 09/2017 ABD pain, nausea constipated Constipation is a chronic issue and she is only taking Colace  Chronically elevated WBC   hospital with stroke 10/30/2016 Left upper extremity weakness on coming back from the beach while in the car She was on aspirin and Plavix at the time Echocardiogram normal ejection fractionno significant valve disease, moderate LVH, diastolic dysfunction  MRI brain: Small acute right basal ganglia region infarct. Prior strokes MRA: negative. Carotid duplex showed bilateral 0-49% stenosis.    Past Medical History:  Diagnosis Date  . Anemia   . Arthritis   . CAD (coronary artery disease)    a. cath 02/2006: BMS x 2 to RCA, cath o/w without significant coronary disease; b. nuclear stress test 07/2014: no signs of ischemia, no ekg changes concerning for ischemia, low risk study/normal study  . Chronic bronchitis ([place])  secondary to cigarette smoking  . Chronic kidney disease (CKD), stage III (moderate)   . COPD (chronic obstructive pulmonary disease) ([place])   . Diabetes mellitus   . FHx: allergies   . GERD (gastroesophageal reflux disease)   . Goiter   . Granulomatous disease ([place])   .  Hernia   . Hyperlipidemia   . Hypertension   . Kidney stone on left side 2013  . Microalbuminuria   . Obesity   . Smokers' cough ([place])   . Spinal stenosis   . Stroke ([place]) 10/29/2016   mild left side weakness  . Stroke [facility]) 08/01/2019   Past Surgical History:  Procedure Laterality Date  . ABDOMINAL HYSTERECTOMY    . BREAST SURGERY    . CATARACT EXTRACTION W/PHACO Right 03/01/2017   Procedure: CATARACT EXTRACTION PHACO AND INTRAOCULAR LENS PLACEMENT ([place]) RIGHT DIABETIC;  Surgeon: Leandrew Koyanagi, MD;  Location: [place];  Service: Ophthalmology;  Laterality: Right;  . CATARACT EXTRACTION W/PHACO Left 03/22/2017   Procedure: CATARACT EXTRACTION PHACO AND INTRAOCULAR LENS PLACEMENT ([place]) LEFT DIABETIC;  Surgeon: Leandrew Koyanagi, MD;  Location: [place];  Service: Ophthalmology;  Laterality: Left;  Diabetic - insulin and oral meds  . COLONOSCOPY WITH PROPOFOL N/A 09/23/2014   Procedure: COLONOSCOPY WITH PROPOFOL;  Surgeon: Lollie Sails, MD;  Location: [facility] ENDOSCOPY;  Service: Endoscopy;  Laterality: N/A;  . COLONOSCOPY WITH PROPOFOL N/A 01/11/2018   Procedure: COLONOSCOPY WITH PROPOFOL;  Surgeon: Lollie Sails, MD;  Location: [facility] ENDOSCOPY;  Service: Endoscopy;  Laterality: N/A;  . COLONOSCOPY WITH PROPOFOL N/A 04/24/2018   Procedure: COLONOSCOPY WITH PROPOFOL;  Surgeon: Lollie Sails, MD;  Location: [facility] ENDOSCOPY;  Service: Endoscopy;  Laterality: N/A;  . CORONARY ANGIOPLASTY WITH STENT PLACEMENT  2008  . ESOPHAGOGASTRODUODENOSCOPY (EGD) WITH PROPOFOL N/A 12/30/2014   Procedure: ESOPHAGOGASTRODUODENOSCOPY (EGD) WITH PROPOFOL;  Surgeon: Lollie Sails, MD;  Location: [facility] ENDOSCOPY;  Service: Endoscopy;  Laterality: N/A;  . ESOPHAGOGASTRODUODENOSCOPY (EGD) WITH PROPOFOL N/A 07/19/2016   Procedure: ESOPHAGOGASTRODUODENOSCOPY (EGD) WITH PROPOFOL;  Surgeon: Lollie Sails, MD;  Location: [facility] ENDOSCOPY;  Service: Endoscopy;  Laterality: N/A;   . ESOPHAGOGASTRODUODENOSCOPY (EGD) WITH PROPOFOL N/A 04/24/2018   Procedure: ESOPHAGOGASTRODUODENOSCOPY (EGD) WITH PROPOFOL;  Surgeon: Lollie Sails, MD;  Location: [facility] ENDOSCOPY;  Service: Endoscopy;  Laterality: N/A;  . hysterectomy (other)      Current Outpatient Medications on File Prior to Visit  Medication Sig Dispense Refill  . acetaminophen (TYLENOL) 500 MG tablet Take 500 mg by mouth every 6 (six) hours as needed.    Marland Kitchen albuterol (VENTOLIN HFA) 108 (90 Base) MCG/ACT inhaler Inhale 2 puffs into the lungs every 4 (four) hours as needed for wheezing or shortness of breath. 48 g 3  . aspirin (ASPIR-81) 81 MG EC tablet Take 81 mg by mouth daily.      . cyanocobalamin 1000 MCG tablet Take 1,000 mcg by mouth daily.    . fluticasone furoate-vilanterol (BREO ELLIPTA) 100-25 MCG/INH AEPB Inhale 1 puff into the lungs daily. 84 each 3  . HYDROcodone-acetaminophen (NORCO/VICODIN) 5-325 MG tablet Take 1 tablet by mouth every 6 (six) hours as needed. 120 tablet 0  . insulin glargine (LANTUS) 100 UNIT/ML injection Inject 22 Units into the skin daily.     . Liraglutide (VICTOZA) 18 MG/3ML SOLN injection Inject 1.8 mg into the skin daily.     . Multiple Vitamins-Minerals (DAILY MULTI) TABS Take 1 tablet by mouth daily.     . nitroGLYCERIN (NITROSTAT) 0.4 MG SL  tablet Place 1 tablet (0.4 mg total) under the tongue every 5 (five) minutes as needed. 25 tablet 6  . Omega-3 Fatty Acids (FISH OIL) 1000 MG CAPS Take 2 capsules by mouth in the morning and at bedtime.     . ondansetron (ZOFRAN ODT) 4 MG disintegrating tablet Take 1 tablet (4 mg total) by mouth every 8 (eight) hours as needed for nausea or vomiting. 20 tablet 0  . RABEprazole (ACIPHEX) 20 MG tablet Take 1 tablet (20 mg total) by mouth daily. 15 Mins. before evening meal 90 tablet 3  . sucralfate (CARAFATE) 1 g tablet Take 1 tablet (1 g total) by mouth 2 (two) times daily. 15 Minutes before evening meal and at bed time 180 tablet 3  .  tiotropium (SPIRIVA) 18 MCG inhalation capsule Place 1 capsule (18 mcg total) into inhaler and inhale daily. 90 capsule 4   No current facility-administered medications on file prior to visit.    Allergies:   Spironolactone, Sulfonamide derivatives, Other, Sulfa antibiotics, Codeine, and Prednisone   Social History   Tobacco Use  . Smoking status: Current Every Day Smoker    Packs/day: 0.75    Years: 40.00    Pack years: 30.00    Types: Cigarettes  . Smokeless tobacco: Never Used  Vaping Use  . Vaping Use: Former  Substance Use Topics  . Alcohol use: Yes    Comment: 1/month- wine  . Drug use: No      Family Hx: The patient's family history includes Breast cancer in her maternal aunt; Congestive Heart Failure in her mother; Diabetes in her mother; Heart failure in her mother; Lung cancer in her father; Stroke in her mother.  ROS:   Please see the history of present illness.    Review of Systems  Constitutional: Negative.   HENT: Negative.   Eyes: Positive for blurred vision.  Respiratory: Positive for shortness of breath.   Cardiovascular: Negative.   Gastrointestinal: Negative.   Musculoskeletal: Negative.   Skin: Negative.   Neurological: Negative.   Psychiatric/Behavioral: Negative.   All other systems reviewed and are negative.    Labs/Other Tests and Data Reviewed:    Recent Labs: 08/07/2019: ALT 21 08/09/2019: BUN 20; Creatinine, Ser 1.44; Hemoglobin 15.0; Magnesium 1.6; Platelets 175; Potassium 3.5; Sodium 139   Recent Lipid Panel Lab Results  Component Value Date/Time   CHOL 129 10/30/2016 05:15 AM   CHOL 79 (L) 05/12/2016 07:48 AM   TRIG 118 10/30/2016 05:15 AM   HDL 52 10/30/2016 05:15 AM   HDL 28 (L) 05/12/2016 07:48 AM   CHOLHDL 2.5 10/30/2016 05:15 AM   LDLCALC 53 10/30/2016 05:15 AM   LDLCALC 17 05/12/2016 07:48 AM    Wt Readings from Last 3 Encounters:  11/06/19 172 lb (78 kg)  10/23/19 173 lb 9.6 oz (78.7 kg)  08/16/19 175 lb (79.4 kg)      Exam:    Vital Signs: Vital signs may also be detailed in the HPI BP 138/90 (BP Location: Left Arm, Patient Position: Sitting, Cuff Size: Normal)   Pulse 70   Ht 5\' 5"  (1.651 m)   Wt 172 lb (78 kg)   SpO2 93%   BMI 28.62 kg/m    Constitutional:  oriented to person, place, and time. No distress.  HENT:  Head: Grossly normal Eyes:  no discharge. No scleral icterus.  Neck: No JVD, no carotid bruits  Cardiovascular: Regular rate and rhythm, no murmurs appreciated Pulmonary/Chest: Clear to auscultation bilaterally, no wheezes or  rails Abdominal: Soft.  no distension.  no tenderness.  Musculoskeletal: Normal range of motion Neurological:  normal muscle tone. Coordination normal. No atrophy Skin: Skin warm and dry Psychiatric: normal affect, pleasant   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 (HCC) Currently with no symptoms of angina. No further workup at this time. Continue current medication regimen.  Stable  Dyspnea, unspecified type Chronic shortness of breath is Multifactorial, Long time smoker,  COPD, deconditioning, weight higher  Essential hypertension Amlodipine she is not taking, we took this off her list, reported hypotension Blood pressure higher today than what she is recording at home, no other changes made  Renal insufficiency Recommend she try to hydrate, recent climbing creatinine, followed by nephrology Prior issues with C. difficile, diarrhea may have been contributing  Hyperlipidemia LDL goal <70 Stressed importance of medication compliance, cholesterol creeping higher which she attributes to her diabetes and poor diet Eating out some, weight higher  Chronic obstructive pulmonary disease, unspecified COPD type ([place]) Smoking cessation recommended Not on oxygen  Vision loss Problem with her retina, had laser procedure has follow-up with [facility] Stable  CVA: Several strokes in the past  , chronic small vessel disease, continues to smoke Continue Zetia with Crestor, goal LDL less than 70 She does not want higher dose statin at this time   Total encounter time more than 25 minutes  Greater than 50% was spent in counseling and coordination of care with the patient    Signed, Ida Rogue, MD  11/06/2019 11:12 AM    [place] [address], [place], [place] [address]

## 2019-11-15 ENCOUNTER — Other Ambulatory Visit: Payer: Self-pay

## 2019-11-15 ENCOUNTER — Other Ambulatory Visit
Admission: RE | Admit: 2019-11-15 | Discharge: 2019-11-15 | Disposition: A | Discharge status: Home / Self Care | Payer: Medicare Other | Source: Ambulatory Visit | Attending: Gastroenterology | Admitting: Gastroenterology

## 2019-11-15 DIAGNOSIS — Z20822 Contact with and (suspected) exposure to covid-19: Secondary | ICD-10-CM | POA: Diagnosis not present

## 2019-11-15 DIAGNOSIS — Z01812 Encounter for preprocedural laboratory examination: Secondary | ICD-10-CM | POA: Diagnosis present

## 2019-11-16 LAB — SARS CORONAVIRUS 2 (TAT 6-24 HRS): SARS Coronavirus 2: NEGATIVE

## 2019-11-19 ENCOUNTER — Encounter: Payer: Self-pay | Admitting: Gastroenterology

## 2019-11-19 ENCOUNTER — Encounter
Admission: RE | Disposition: A | Discharge status: Home / Self Care | Payer: Self-pay | Source: Home / Self Care | Attending: Gastroenterology

## 2019-11-19 ENCOUNTER — Ambulatory Visit
Admission: RE | Admit: 2019-11-19 | Discharge: 2019-11-19 | Disposition: A | Discharge status: Home / Self Care | Payer: Medicare Other | Attending: Gastroenterology | Admitting: Gastroenterology

## 2019-11-19 ENCOUNTER — Ambulatory Visit: Payer: Medicare Other | Admitting: Certified Registered"

## 2019-11-19 DIAGNOSIS — Z794 Long term (current) use of insulin: Secondary | ICD-10-CM | POA: Diagnosis not present

## 2019-11-19 DIAGNOSIS — N183 Chronic kidney disease, stage 3 unspecified: Secondary | ICD-10-CM | POA: Insufficient documentation

## 2019-11-19 DIAGNOSIS — K5730 Diverticulosis of large intestine without perforation or abscess without bleeding: Secondary | ICD-10-CM | POA: Insufficient documentation

## 2019-11-19 DIAGNOSIS — K635 Polyp of colon: Secondary | ICD-10-CM

## 2019-11-19 DIAGNOSIS — Z79899 Other long term (current) drug therapy: Secondary | ICD-10-CM | POA: Insufficient documentation

## 2019-11-19 DIAGNOSIS — Z8719 Personal history of other diseases of the digestive system: Secondary | ICD-10-CM | POA: Diagnosis not present

## 2019-11-19 DIAGNOSIS — Z7902 Long term (current) use of antithrombotics/antiplatelets: Secondary | ICD-10-CM | POA: Insufficient documentation

## 2019-11-19 DIAGNOSIS — Z1211 Encounter for screening for malignant neoplasm of colon: Secondary | ICD-10-CM | POA: Insufficient documentation

## 2019-11-19 DIAGNOSIS — D122 Benign neoplasm of ascending colon: Secondary | ICD-10-CM | POA: Insufficient documentation

## 2019-11-19 DIAGNOSIS — Z7951 Long term (current) use of inhaled steroids: Secondary | ICD-10-CM | POA: Insufficient documentation

## 2019-11-19 DIAGNOSIS — F17210 Nicotine dependence, cigarettes, uncomplicated: Secondary | ICD-10-CM | POA: Diagnosis not present

## 2019-11-19 DIAGNOSIS — Z6828 Body mass index (BMI) 28.0-28.9, adult: Secondary | ICD-10-CM | POA: Insufficient documentation

## 2019-11-19 DIAGNOSIS — M1990 Unspecified osteoarthritis, unspecified site: Secondary | ICD-10-CM | POA: Diagnosis not present

## 2019-11-19 DIAGNOSIS — Z7982 Long term (current) use of aspirin: Secondary | ICD-10-CM | POA: Insufficient documentation

## 2019-11-19 DIAGNOSIS — Z86010 Personal history of colonic polyps: Secondary | ICD-10-CM

## 2019-11-19 DIAGNOSIS — I2510 Atherosclerotic heart disease of native coronary artery without angina pectoris: Secondary | ICD-10-CM | POA: Insufficient documentation

## 2019-11-19 DIAGNOSIS — E1122 Type 2 diabetes mellitus with diabetic chronic kidney disease: Secondary | ICD-10-CM | POA: Insufficient documentation

## 2019-11-19 DIAGNOSIS — K648 Other hemorrhoids: Secondary | ICD-10-CM | POA: Diagnosis not present

## 2019-11-19 DIAGNOSIS — J449 Chronic obstructive pulmonary disease, unspecified: Secondary | ICD-10-CM | POA: Diagnosis not present

## 2019-11-19 DIAGNOSIS — K5520 Angiodysplasia of colon without hemorrhage: Secondary | ICD-10-CM | POA: Insufficient documentation

## 2019-11-19 DIAGNOSIS — K640 First degree hemorrhoids: Secondary | ICD-10-CM | POA: Diagnosis not present

## 2019-11-19 DIAGNOSIS — Z8619 Personal history of other infectious and parasitic diseases: Secondary | ICD-10-CM | POA: Insufficient documentation

## 2019-11-19 DIAGNOSIS — I129 Hypertensive chronic kidney disease with stage 1 through stage 4 chronic kidney disease, or unspecified chronic kidney disease: Secondary | ICD-10-CM | POA: Insufficient documentation

## 2019-11-19 DIAGNOSIS — Q2733 Arteriovenous malformation of digestive system vessel: Secondary | ICD-10-CM | POA: Diagnosis not present

## 2019-11-19 DIAGNOSIS — E785 Hyperlipidemia, unspecified: Secondary | ICD-10-CM | POA: Insufficient documentation

## 2019-11-19 DIAGNOSIS — E669 Obesity, unspecified: Secondary | ICD-10-CM | POA: Insufficient documentation

## 2019-11-19 HISTORY — PX: COLONOSCOPY WITH PROPOFOL: SHX5780

## 2019-11-19 LAB — GLUCOSE, CAPILLARY: Glucose-Capillary: 162 mg/dL — ABNORMAL HIGH (ref 70–99)

## 2019-11-19 SURGERY — COLONOSCOPY WITH PROPOFOL
Anesthesia: General

## 2019-11-19 MED ORDER — PROPOFOL 500 MG/50ML IV EMUL
INTRAVENOUS | Status: DC | PRN
  Administered 2019-11-19: 110 ug/kg/min via INTRAVENOUS

## 2019-11-19 MED ORDER — PROPOFOL 500 MG/50ML IV EMUL
INTRAVENOUS | Status: AC
  Filled 2019-11-19: qty 50

## 2019-11-19 MED ORDER — SODIUM CHLORIDE 0.9 % IV SOLN: INTRAVENOUS | Status: DC

## 2019-11-19 MED ORDER — PROPOFOL 10 MG/ML IV BOLUS
INTRAVENOUS | Status: DC | PRN
  Administered 2019-11-19: 40 mg via INTRAVENOUS

## 2019-11-19 MED ORDER — LIDOCAINE HCL (CARDIAC) PF 100 MG/5ML IV SOSY
PREFILLED_SYRINGE | INTRAVENOUS | Status: DC | PRN
  Administered 2019-11-19: 40 mg via INTRAVENOUS

## 2019-11-19 NOTE — Anesthesia Postprocedure Evaluation (Signed)
Anesthesia Post Note  Patient: Talasia Saulter Radle  Procedure(s) Performed: COLONOSCOPY WITH PROPOFOL (N/A )  Patient location during evaluation: PACU Anesthesia Type: General Level of consciousness: awake and alert Pain management: pain level controlled Vital Signs Assessment: post-procedure vital signs reviewed and stable Respiratory status: spontaneous breathing, nonlabored ventilation, respiratory function stable and patient connected to nasal cannula oxygen Cardiovascular status: blood pressure returned to baseline and stable Postop Assessment: no apparent nausea or vomiting Anesthetic complications: no   No complications documented.   Last Vitals:  Vitals:   11/19/19 0936 11/19/19 0946  BP: (!) 156/75 (!) 167/79  Pulse: (!) 59 61  Resp: 17 18  Temp:    SpO2: 99% 99%    Last Pain:  Vitals:   11/19/19 0946  TempSrc:   PainSc: 0-No pain                 Molli Barrows

## 2019-11-19 NOTE — Transfer of Care (Signed)
Immediate Anesthesia Transfer of Care Note  Patient: Hannah Kirby  Procedure(s) Performed: COLONOSCOPY WITH PROPOFOL (N/A )  Patient Location: PACU  Anesthesia Type:MAC  Level of Consciousness: awake, alert  and oriented  Airway & Oxygen Therapy: Patient Spontanous Breathing  Post-op Assessment: Report given to RN and Post -op Vital signs reviewed and stable  Post vital signs: Reviewed and stable  Last Vitals:  Vitals Value Taken Time  BP 130/67 11/19/19 0926  Temp 36.3 C 11/19/19 0926  Pulse 62 11/19/19 0926  Resp 17 11/19/19 0926  SpO2 100 % 11/19/19 0926    Last Pain:  Vitals:   11/19/19 0814  TempSrc: Tympanic         Complications: No complications documented.

## 2019-11-19 NOTE — Anesthesia Preprocedure Evaluation (Signed)
Anesthesia Evaluation  Patient identified by MRN, date of birth, ID band Patient awake    Reviewed: Allergy & Precautions, H&P , NPO status , Patient's Chart, lab work & pertinent test results, reviewed documented beta blocker date and time   Airway Mallampati: II   Neck ROM: full    Dental  (+) Teeth Intact   Pulmonary COPD, Current Smoker and Patient abstained from smoking.,    Pulmonary exam normal        Cardiovascular Exercise Tolerance: Poor hypertension, On Medications (-) angina+ CAD  Normal cardiovascular exam Rhythm:regular Rate:Normal     Neuro/Psych TIA Neuromuscular disease CVA, Residual Symptoms negative psych ROS   GI/Hepatic Neg liver ROS, PUD, GERD  Medicated,  Endo/Other  negative endocrine ROSdiabetes  Renal/GU Renal disease  negative genitourinary   Musculoskeletal   Abdominal   Peds  Hematology  (+) Blood dyscrasia, anemia ,   Anesthesia Other Findings Past Medical History: No date: Anemia No date: Arthritis No date: CAD (coronary artery disease)     Comment:  a. cath 02/2006: BMS x 2 to RCA, cath o/w without               significant coronary disease; b. nuclear stress test               07/2014: no signs of ischemia, no ekg changes concerning               for ischemia, low risk study/normal study No date: Chronic bronchitis (HCC)     Comment:  secondary to cigarette smoking No date: Chronic kidney disease (CKD), stage III (moderate) No date: COPD (chronic obstructive pulmonary disease) (HCC) No date: Diabetes mellitus No date: FHx: allergies No date: GERD (gastroesophageal reflux disease) No date: Goiter No date: Granulomatous disease ([place]) No date: Hernia No date: Hyperlipidemia No date: Hypertension 2013: Kidney stone on left side No date: Microalbuminuria No date: Obesity No date: Smokers' cough ([place]) No date: Spinal stenosis 10/29/2016: Stroke [facility])     Comment:  mild left  side weakness 08/01/2019: Stroke [facility]) Past Surgical History: No date: ABDOMINAL HYSTERECTOMY No date: BREAST SURGERY 03/01/2017: CATARACT EXTRACTION W/PHACO; Right     Comment:  Procedure: CATARACT EXTRACTION PHACO AND INTRAOCULAR               LENS PLACEMENT ([place]) RIGHT DIABETIC;  Surgeon:               Leandrew Koyanagi, MD;  Location: [place];              Service: Ophthalmology;  Laterality: Right; 03/22/2017: CATARACT EXTRACTION W/PHACO; Left     Comment:  Procedure: CATARACT EXTRACTION PHACO AND INTRAOCULAR               LENS PLACEMENT ([place]) LEFT DIABETIC;  Surgeon: Leandrew Koyanagi, MD;  Location: [place];  Service:               Ophthalmology;  Laterality: Left;  Diabetic - insulin and              oral meds 09/23/2014: COLONOSCOPY WITH PROPOFOL; N/A     Comment:  Procedure: COLONOSCOPY WITH PROPOFOL;  Surgeon: Lollie Sails, MD;  Location: [facility] ENDOSCOPY;  Service:               Endoscopy;  Laterality:  N/A; 01/11/2018: COLONOSCOPY WITH PROPOFOL; N/A     Comment:  Procedure: COLONOSCOPY WITH PROPOFOL;  Surgeon:               Lollie Sails, MD;  Location: [facility] ENDOSCOPY;                Service: Endoscopy;  Laterality: N/A; 04/24/2018: COLONOSCOPY WITH PROPOFOL; N/A     Comment:  Procedure: COLONOSCOPY WITH PROPOFOL;  Surgeon:               Lollie Sails, MD;  Location: [facility] ENDOSCOPY;                Service: Endoscopy;  Laterality: N/A; 2008: CORONARY ANGIOPLASTY WITH STENT PLACEMENT 12/30/2014: ESOPHAGOGASTRODUODENOSCOPY (EGD) WITH PROPOFOL; N/A     Comment:  Procedure: ESOPHAGOGASTRODUODENOSCOPY (EGD) WITH               PROPOFOL;  Surgeon: Lollie Sails, MD;  Location:               [facility] ENDOSCOPY;  Service: Endoscopy;  Laterality: N/A; 07/19/2016: ESOPHAGOGASTRODUODENOSCOPY (EGD) WITH PROPOFOL; N/A     Comment:  Procedure: ESOPHAGOGASTRODUODENOSCOPY (EGD) WITH               PROPOFOL;  Surgeon: Lollie Sails, MD;  Location:               [facility] ENDOSCOPY;  Service: Endoscopy;  Laterality: N/A; 04/24/2018: ESOPHAGOGASTRODUODENOSCOPY (EGD) WITH PROPOFOL; N/A     Comment:  Procedure: ESOPHAGOGASTRODUODENOSCOPY (EGD) WITH               PROPOFOL;  Surgeon: Lollie Sails, MD;  Location:               [facility] ENDOSCOPY;  Service: Endoscopy;  Laterality: N/A; No date: hysterectomy (other) BMI    Body Mass Index: 28.62 kg/m     Reproductive/Obstetrics negative OB ROS                             Anesthesia Physical Anesthesia Plan  ASA: III  Anesthesia Plan: General   Post-op Pain Management:    Induction:   PONV Risk Score and Plan:   Airway Management Planned:   Additional Equipment:   Intra-op Plan:   Post-operative Plan:   Informed Consent: I have reviewed the patients History and Physical, chart, labs and discussed the procedure including the risks, benefits and alternatives for the proposed anesthesia with the patient or authorized representative who has indicated his/her understanding and acceptance.     Dental Advisory Given  Plan Discussed with: CRNA  Anesthesia Plan Comments:         Anesthesia Quick Evaluation

## 2019-11-19 NOTE — Op Note (Signed)
[facility] Gastroenterology Patient Name: Hannah Kirby Procedure Date: 11/19/2019 8:56 AM MRN: [id number] Account #: [id number] Date of Birth: January 29, 1946 Admit Type: Outpatient Age: 74 Room: [facility] ENDO ROOM 4 Gender: Female Note Status: Finalized Procedure:             Colonoscopy Indications:           High risk colon cancer surveillance: Personal history                         of colonic polyps Providers:             Lucilla Lame MD, MD Referring MD:          Kirstie Peri. Caryn Section, MD (Referring MD) Medicines:             Propofol per Anesthesia Complications:         No immediate complications. Procedure:             Pre-Anesthesia Assessment:                        - Prior to the procedure, a History and Physical was                         performed, and patient medications and allergies were                         reviewed. The patient's tolerance of previous                         anesthesia was also reviewed. The risks and benefits                         of the procedure and the sedation options and risks                         were discussed with the patient. All questions were                         answered, and informed consent was obtained. Prior                         Anticoagulants: The patient has taken no previous                         anticoagulant or antiplatelet agents. ASA Grade                         Assessment: III - A patient with severe systemic                         disease. After reviewing the risks and benefits, the                         patient was deemed in satisfactory condition to                         undergo the procedure.  After obtaining informed consent, the colonoscope was                         passed under direct vision. Throughout the procedure,                         the patient's blood pressure, pulse, and oxygen                         saturations were monitored continuously. The                          Colonoscope was introduced through the anus and                         advanced to the the cecum, identified by appendiceal                         orifice and ileocecal valve. The colonoscopy was                         performed without difficulty. The patient tolerated                         the procedure well. The quality of the bowel                         preparation was poor. Findings:      The perianal and digital rectal examinations were normal.      A single medium-sized angiodysplastic lesion without bleeding was found       in the ascending colon.      A 7 mm polyp was found in the ascending colon. The polyp was sessile.       The polyp was removed with a cold snare. Resection and retrieval were       complete.      Multiple small-mouthed diverticula were found in the sigmoid colon.      Non-bleeding internal hemorrhoids were found during retroflexion. The       hemorrhoids were Grade I (internal hemorrhoids that do not prolapse). Impression:            - Preparation of the colon was poor.                        - A single non-bleeding colonic angiodysplastic lesion.                        - One 7 mm polyp in the ascending colon, removed with                         a cold snare. Resected and retrieved.                        - Diverticulosis in the sigmoid colon.                        - Non-bleeding internal hemorrhoids. Recommendation:        - Discharge patient to home.                        -  Resume previous diet.                        - Continue present medications.                        - Await pathology results. Procedure Code(s):     --- Professional ---                        [phone], Colonoscopy, flexible; with removal of                         tumor(s), polyp(s), or other lesion(s) by snare                         technique Diagnosis Code(s):     --- Professional ---                        Z86.010, Personal history of colonic polyps                         K63.5, Polyp of colon CPT copyright 2019 [employer] Association. All rights reserved. The codes documented in this report are preliminary and upon coder review may  be revised to meet current compliance requirements. Lucilla Lame MD, MD 11/19/2019 9:23:17 AM This report has been signed electronically. Number of Addenda: 0 Note Initiated On: 11/19/2019 8:56 AM Scope Withdrawal Time: 0 hours 11 minutes 58 seconds  Total Procedure Duration: 0 hours 21 minutes 32 seconds  Estimated Blood Loss:  Estimated blood loss: none.      [facility]

## 2019-11-19 NOTE — Interval H&P Note (Signed)
History and Physical Interval Note:  11/19/2019 8:49 AM  Hannah Kirby  has presented today for surgery, with the diagnosis of Hx of colon polyps Z86.010.  The various methods of treatment have been discussed with the patient and family. After consideration of risks, benefits and other options for treatment, the patient has consented to  Procedure(s): COLONOSCOPY WITH PROPOFOL (N/A) as a surgical intervention.  The patient's history has been reviewed, patient examined, no change in status, stable for surgery.  I have reviewed the patient's chart and labs.  Questions were answered to the patient's satisfaction.     Benjamen Koelling [employer]

## 2019-11-20 ENCOUNTER — Ambulatory Visit (INDEPENDENT_AMBULATORY_CARE_PROVIDER_SITE_OTHER): Payer: Medicare Other | Admitting: Family Medicine

## 2019-11-20 ENCOUNTER — Encounter: Payer: Self-pay | Admitting: Gastroenterology

## 2019-11-20 ENCOUNTER — Other Ambulatory Visit: Payer: Self-pay

## 2019-11-20 VITALS — BP 142/78 | HR 74 | Temp 98.5°F | Wt 171.0 lb

## 2019-11-20 DIAGNOSIS — J449 Chronic obstructive pulmonary disease, unspecified: Secondary | ICD-10-CM

## 2019-11-20 DIAGNOSIS — I639 Cerebral infarction, unspecified: Secondary | ICD-10-CM

## 2019-11-20 DIAGNOSIS — N2581 Secondary hyperparathyroidism of renal origin: Secondary | ICD-10-CM

## 2019-11-20 DIAGNOSIS — R531 Weakness: Secondary | ICD-10-CM | POA: Diagnosis not present

## 2019-11-20 DIAGNOSIS — E0821 Diabetes mellitus due to underlying condition with diabetic nephropathy: Secondary | ICD-10-CM

## 2019-11-20 DIAGNOSIS — I5032 Chronic diastolic (congestive) heart failure: Secondary | ICD-10-CM | POA: Insufficient documentation

## 2019-11-20 DIAGNOSIS — I10 Essential (primary) hypertension: Secondary | ICD-10-CM

## 2019-11-20 DIAGNOSIS — M48061 Spinal stenosis, lumbar region without neurogenic claudication: Secondary | ICD-10-CM

## 2019-11-20 DIAGNOSIS — R0609 Other forms of dyspnea: Secondary | ICD-10-CM

## 2019-11-20 DIAGNOSIS — R0600 Dyspnea, unspecified: Secondary | ICD-10-CM | POA: Diagnosis not present

## 2019-11-20 DIAGNOSIS — Z23 Encounter for immunization: Secondary | ICD-10-CM

## 2019-11-20 DIAGNOSIS — F1190 Opioid use, unspecified, uncomplicated: Secondary | ICD-10-CM

## 2019-11-20 LAB — POCT GLYCOSYLATED HEMOGLOBIN (HGB A1C): Hemoglobin A1C: 8.2 % — AB (ref 4.0–5.6)

## 2019-11-20 LAB — SURGICAL PATHOLOGY

## 2019-11-20 MED ORDER — HYDROCODONE-ACETAMINOPHEN 5-325 MG PO TABS: 1.0000 | ORAL_TABLET | Freq: Four times a day (QID) | ORAL | 0 refills | Status: DC | PRN

## 2019-11-20 MED ORDER — BREO ELLIPTA 100-25 MCG/INH IN AEPB: 1.0000 | INHALATION_SPRAY | Freq: Every day | RESPIRATORY_TRACT | 3 refills | Status: DC

## 2019-11-20 MED ORDER — TIOTROPIUM BROMIDE MONOHYDRATE 18 MCG IN CAPS: 18.0000 ug | ORAL_CAPSULE | Freq: Every day | RESPIRATORY_TRACT | 4 refills | Status: DC

## 2019-11-20 NOTE — Progress Notes (Signed)
Established patient visit   Patient: Hannah Kirby   DOB: [birth year]   74 y.o. Female  MRN: [id number] Visit Date: 11/20/2019  Today's healthcare provider: Lelon Huh, MD   Chief Complaint  Patient presents with  . Diabetes  . Hyperlipidemia  . Hypertension   Subjective    HPI   Hypertension, follow-up  BP Readings from Last 3 Encounters:  11/20/19 (!) 142/78  11/19/19 (!) 167/79  11/06/19 138/90   Wt Readings from Last 3 Encounters:  11/20/19 171 lb (77.6 kg)  11/19/19 172 lb (78 kg)  11/06/19 172 lb (78 kg)     She was last seen for hypertension 3 months ago.  BP at that visit was 122/70. Management since that visit includes having Dr [name] add Amlodipine to her regiment.    She reports good compliance with treatment. She is having side effects. When taking the Amlodipine it seems to drop her pressure too low.  She states she is taking it as needed currently. She is following a Regular diet. She is not exercising. She does smoke.  Use of agents associated with hypertension: none.   Outside blood pressures are averaging 140's over 70's-80's. Symptoms: No chest pain No chest pressure  No palpitations No syncope  Yes dyspnea No orthopnea  No paroxysmal nocturnal dyspnea No lower extremity edema   Pertinent labs: Lab Results  Component Value Date   CHOL 129 10/30/2016   HDL 52 10/30/2016   LDLCALC 53 10/30/2016   TRIG 118 10/30/2016   CHOLHDL 2.5 10/30/2016   Lab Results  Component Value Date   NA 139 08/09/2019   K 3.5 08/09/2019   CREATININE 1.44 (H) 08/09/2019   GFRNONAA 36 (L) 08/09/2019   GFRAA 42 (L) 08/09/2019   GLUCOSE 106 (H) 08/09/2019     The ASCVD Risk score ([place] Jr., et al., 2013) failed to calculate for the following reasons:   The patient has a prior MI or stroke diagnosis   --------------------------------------------------------------------------------------------------- Lipid/Cholesterol, Follow-up  Last lipid  panel Other pertinent labs  Lab Results  Component Value Date   CHOL 129 10/30/2016   HDL 52 10/30/2016   LDLCALC 53 10/30/2016   TRIG 118 10/30/2016   CHOLHDL 2.5 10/30/2016   Lab Results  Component Value Date   ALT 21 08/07/2019   AST 26 08/07/2019   PLT 175 08/09/2019     She was last seen for this 3 months ago.  Management since that visit includes none.  She reports good compliance with treatment. She is not having side effects.   Symptoms: No chest pain No chest pressure/discomfort  No dyspnea No lower extremity edema  Yes numbness or tingling of extremity No orthopnea  No palpitations No paroxysmal nocturnal dyspnea  No speech difficulty No syncope   Current diet: in general, a "healthy" diet   Current exercise: none  The ASCVD Risk score ([place] et al., 2013) failed to calculate for the following reasons:   The patient has a prior MI or stroke diagnosis  --------------------------------------------------------------------------------------------------- Diabetes Mellitus Type II, Follow-up  Lab Results  Component Value Date   HGBA1C 8.0 (H) 08/01/2019   HGBA1C 6.9 10/09/2017   HGBA1C 6.3 (H) 10/30/2016   Wt Readings from Last 3 Encounters:  11/20/19 171 lb (77.6 kg)  11/19/19 172 lb (78 kg)  11/06/19 172 lb (78 kg)   Last seen for diabetes 3 months ago.  Management since then includes none. She repgorts good compliance with  treatment. She is not having side effects.  Symptoms: Yes fatigue No foot ulcerations  No appetite changes No nausea  No paresthesia of the feet  No polydipsia  No polyuria No visual disturbances   No vomiting     Home blood sugar records: fasting range: 140-160  Episodes of hypoglycemia? No    Current insulin regiment: Lantus 22 iu Most Recent Eye Exam: August 2021  Pertinent Labs: Lab Results  Component Value Date   CHOL 129 10/30/2016   HDL 52 10/30/2016   LDLCALC 53 10/30/2016   TRIG 118 10/30/2016    CHOLHDL 2.5 10/30/2016   Lab Results  Component Value Date   NA 139 08/09/2019   K 3.5 08/09/2019   CREATININE 1.44 (H) 08/09/2019   GFRNONAA 36 (L) 08/09/2019   GFRAA 42 (L) 08/09/2019   GLUCOSE 106 (H) 08/09/2019     ---------------------------------------------------------------------------------------------------  She states she has felt unusually weak, fatigued and short of breath the last couple of months. She has trouble standing up from chair due to leg weakness. She feels a little off balance but has not had any falls. 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 She is followed by neurology and has appt with Chipper Herb next week. She had MRI when she was hospitalized in June with finding of 34mm acute lacunar infarct right thalamo-capsular junction.  She has history diastolic dysfunction with last echocardiogram in June showing LVEF=60-64%.   She does have COPD history and states she is using inhalers consistently.      Medications: Outpatient Medications Prior to Visit  Medication Sig  . acetaminophen (TYLENOL) 500 MG tablet Take 500 mg by mouth every 6 (six) hours as needed.  Marland Kitchen albuterol (VENTOLIN HFA) 108 (90 Base) MCG/ACT inhaler Inhale 2 puffs into the lungs every 4 (four) hours as needed for wheezing or shortness of breath.  Marland Kitchen amLODipine (NORVASC) 5 MG tablet Take 2.5 mg by mouth daily.  Marland Kitchen aspirin (ASPIR-81) 81 MG EC tablet Take 81 mg by mouth daily.    . clopidogrel (PLAVIX) 75 MG tablet Take 1 tablet (75 mg total) by mouth daily.  . cyanocobalamin 1000 MCG tablet Take 1,000 mcg by mouth daily.  Marland Kitchen ezetimibe (ZETIA) 10 MG tablet Take 1 tablet (10 mg total) by mouth daily.  . fluticasone furoate-vilanterol (BREO ELLIPTA) 100-25 MCG/INH AEPB Inhale 1 puff into the lungs daily.  Marland Kitchen HYDROcodone-acetaminophen (NORCO/VICODIN) 5-325 MG tablet Take 1 tablet by mouth every 6 (six) hours as needed.  . insulin glargine  (LANTUS) 100 UNIT/ML injection Inject 22 Units into the skin daily.   . Liraglutide (VICTOZA) 18 MG/3ML SOLN injection Inject 1.8 mg into the skin daily.   Marland Kitchen lisinopril (ZESTRIL) 20 MG tablet Take 1 tablet (20 mg total) by mouth daily.  . metoprolol tartrate (LOPRESSOR) 100 MG tablet Take 1 tablet (100 mg total) by mouth 2 (two) times daily.  . Multiple Vitamins-Minerals (DAILY MULTI) TABS Take 1 tablet by mouth daily.   . nitroGLYCERIN (NITROSTAT) 0.4 MG SL tablet Place 1 tablet (0.4 mg total) under the tongue every 5 (five) minutes as needed.  . Omega-3 Fatty Acids (FISH OIL) 1000 MG CAPS Take 2 capsules by mouth in the morning and at bedtime.   . ondansetron (ZOFRAN ODT) 4 MG disintegrating tablet Take 1 tablet (4 mg total) by mouth every 8 (eight) hours as needed for nausea or vomiting.  . RABEprazole (ACIPHEX) 20 MG  tablet Take 1 tablet (20 mg total) by mouth daily. 15 Mins. before evening meal  . rosuvastatin (CRESTOR) 10 MG tablet Take 1 tablet (10 mg total) by mouth daily.  . sucralfate (CARAFATE) 1 g tablet Take 1 tablet (1 g total) by mouth 2 (two) times daily. 15 Minutes before evening meal and at bed time  . tiotropium (SPIRIVA) 18 MCG inhalation capsule Place 1 capsule (18 mcg total) into inhaler and inhale daily.   No facility-administered medications prior to visit.      Objective    BP (!) 142/78 (BP Location: Left Arm, Patient Position: Sitting, Cuff Size: Normal)   Pulse 74   Temp 98.5 F (36.9 C) (Oral)   Wt 171 lb (77.6 kg)   SpO2 96%   BMI 28.46 kg/m     Physical Exam   General: Appearance:     Well developed, well nourished female in no acute distress  Eyes:    PERRL, conjunctiva/corneas clear, EOM's intact       Lungs:     Clear to auscultation bilaterally, respirations unlabored  Heart:    Normal heart rate. Normal rhythm. No murmurs, rubs, or gallops.   MS:   All extremities are intact.   Neurologic:   Awake, alert, oriented x 3. No apparent focal  neurological           defect. Mild generalized weakness. +5 right sided strength, +4 left Ue strength. She is able to stand and walk from chair but has to push herself up with arms.         Results for orders placed or performed in visit on 11/20/19  POCT glycosylated hemoglobin (Hb A1C)  Result Value Ref Range   Hemoglobin A1C 8.2 (A) 4.0 - 5.6 %    Assessment & Plan     1. Weakness  - CBC - Comprehensive metabolic panel - TSH  May be after affect of CVA. She does have upcoming neurology follow up   2. Dyspnea on exertion  - CBC - Brain natriuretic peptide  3. Essential hypertension BP up somewhat. Was recently take off amlodipine apparently due to symptomatic hyptension.   4. Diabetes mellitus due to underlying condition with diabetic nephropathy, unspecified whether long term insulin use ([place]) a1c up a bit. Will continue current medications for the time being since she is feeling poorly. Consider increasing insulin when she is feeling better.  She has follow up scheduled with Dr. Honor Junes in November   5. Need for influenza vaccination  - Flu Vaccine QUAD High Dose(Fluad)  6. Chronic obstructive pulmonary disease, unspecified COPD type (HCC) Refill  - fluticasone furoate-vilanterol (BREO ELLIPTA) 100-25 MCG/INH AEPB; Inhale 1 puff into the lungs daily.  Dispense: 84 each; Refill: 3 - tiotropium (SPIRIVA) 18 MCG inhalation capsule; Place 1 capsule (18 mcg total) into inhaler and inhale daily.  Dispense: 90 capsule; Refill: 4  7. Spinal stenosis of lumbar region, unspecified whether neurogenic claudication present Refill - HYDROcodone-acetaminophen (NORCO/VICODIN) 5-325 MG tablet; Take 1 tablet by mouth every 6 (six) hours as needed.  Dispense: 120 tablet; Refill: 0  8. Chronic, continuous use of opioids Stable on current pain medication regiment.  - HYDROcodone-acetaminophen (NORCO/VICODIN) 5-325 MG tablet; Take 1 tablet by mouth every 6 (six) hours as needed.   Dispense: 120 tablet; Refill: 0  9. Chronic diastolic congestive heart failure (HCC) Checking BNP today.   10. Secondary hyperparathyroidism of renal origin [facility]) Continue routine follow up nephrology.    Addressed extensive list of  chronic and acute medical problems today requiring 45 minutes reviewing her medical record, counseling patient regarding her conditions and coordination of care.          The entirety of the information documented in the History of Present Illness, Review of Systems and Physical Exam were personally obtained by me. Portions of this information were initially documented by the CMA and reviewed by me for thoroughness and accuracy.      Lelon Huh, MD  [facility] [phone] (phone) [phone] (fax)  [place]

## 2019-11-21 ENCOUNTER — Encounter: Payer: Self-pay | Admitting: Family Medicine

## 2019-11-21 ENCOUNTER — Encounter: Payer: Self-pay | Admitting: Gastroenterology

## 2019-11-21 LAB — COMPREHENSIVE METABOLIC PANEL
ALT: 23 IU/L (ref 0–32)
AST: 25 IU/L (ref 0–40)
Albumin/Globulin Ratio: 1.6 (ref 1.2–2.2)
Albumin: 4.2 g/dL (ref 3.7–4.7)
Alkaline Phosphatase: 113 IU/L (ref 44–121)
BUN/Creatinine Ratio: 12 (ref 12–28)
BUN: 16 mg/dL (ref 8–27)
Bilirubin Total: 0.4 mg/dL (ref 0.0–1.2)
CO2: 26 mmol/L (ref 20–29)
Calcium: 10.3 mg/dL (ref 8.7–10.3)
Chloride: 102 mmol/L (ref 96–106)
Creatinine, Ser: 1.38 mg/dL — ABNORMAL HIGH (ref 0.57–1.00)
GFR calc Af Amer: 44 mL/min/{1.73_m2} — ABNORMAL LOW (ref >59)
GFR calc non Af Amer: 38 mL/min/{1.73_m2} — ABNORMAL LOW (ref >59)
Globulin, Total: 2.7 g/dL (ref 1.5–4.5)
Glucose: 140 mg/dL — ABNORMAL HIGH (ref 65–99)
Potassium: 3.6 mmol/L (ref 3.5–5.2)
Sodium: 144 mmol/L (ref 134–144)
Total Protein: 6.9 g/dL (ref 6.0–8.5)

## 2019-11-21 LAB — CBC
Hematocrit: 49.3 % — ABNORMAL HIGH (ref 34.0–46.6)
Hemoglobin: 17.4 g/dL — ABNORMAL HIGH (ref 11.1–15.9)
MCH: 33.4 pg — ABNORMAL HIGH (ref 26.6–33.0)
MCHC: 35.3 g/dL (ref 31.5–35.7)
MCV: 95 fL (ref 79–97)
Platelets: 214 10*3/uL (ref 150–450)
RBC: 5.21 x10E6/uL (ref 3.77–5.28)
RDW: 12.7 % (ref 11.7–15.4)
WBC: 10.2 10*3/uL (ref 3.4–10.8)

## 2019-11-21 LAB — TSH: TSH: 3.6 u[IU]/mL (ref 0.450–4.500)

## 2019-11-21 LAB — BRAIN NATRIURETIC PEPTIDE: BNP: 217.5 pg/mL — ABNORMAL HIGH (ref 0.0–100.0)

## 2019-11-24 ENCOUNTER — Other Ambulatory Visit: Payer: Self-pay | Admitting: Family Medicine

## 2019-11-24 DIAGNOSIS — R0609 Other forms of dyspnea: Secondary | ICD-10-CM

## 2019-11-26 ENCOUNTER — Ambulatory Visit
Admission: RE | Admit: 2019-11-26 | Discharge: 2019-11-26 | Disposition: A | Discharge status: Home / Self Care | Payer: Medicare Other | Source: Ambulatory Visit | Attending: Family Medicine | Admitting: Family Medicine

## 2019-11-26 ENCOUNTER — Other Ambulatory Visit: Payer: Self-pay

## 2019-11-26 ENCOUNTER — Ambulatory Visit
Admission: RE | Admit: 2019-11-26 | Discharge: 2019-11-26 | Disposition: A | Discharge status: Home / Self Care | Payer: Medicare Other | Attending: Family Medicine | Admitting: Family Medicine

## 2019-11-26 DIAGNOSIS — R0600 Dyspnea, unspecified: Secondary | ICD-10-CM | POA: Diagnosis present

## 2019-11-26 DIAGNOSIS — R0609 Other forms of dyspnea: Secondary | ICD-10-CM

## 2019-11-29 ENCOUNTER — Other Ambulatory Visit: Payer: Self-pay | Admitting: Acute Care

## 2019-11-29 DIAGNOSIS — I639 Cerebral infarction, unspecified: Secondary | ICD-10-CM

## 2019-12-16 ENCOUNTER — Other Ambulatory Visit: Payer: Self-pay

## 2019-12-16 ENCOUNTER — Ambulatory Visit
Admission: RE | Admit: 2019-12-16 | Discharge: 2019-12-16 | Disposition: A | Discharge status: Home / Self Care | Payer: Medicare Other | Source: Ambulatory Visit | Attending: Acute Care | Admitting: Acute Care

## 2019-12-16 DIAGNOSIS — I639 Cerebral infarction, unspecified: Secondary | ICD-10-CM | POA: Diagnosis present

## 2019-12-23 ENCOUNTER — Encounter: Payer: Self-pay | Admitting: Family Medicine

## 2019-12-24 ENCOUNTER — Other Ambulatory Visit: Payer: Self-pay

## 2019-12-24 DIAGNOSIS — M48061 Spinal stenosis, lumbar region without neurogenic claudication: Secondary | ICD-10-CM

## 2019-12-24 DIAGNOSIS — F1190 Opioid use, unspecified, uncomplicated: Secondary | ICD-10-CM

## 2019-12-24 MED ORDER — HYDROCODONE-ACETAMINOPHEN 5-325 MG PO TABS: 1.0000 | ORAL_TABLET | Freq: Four times a day (QID) | ORAL | 0 refills | Status: DC | PRN

## 2019-12-24 NOTE — Telephone Encounter (Signed)
Patient sent mychart message requesting a refill. Please advise.

## 2019-12-30 NOTE — Progress Notes (Signed)
Established patient visit   Patient: Hannah Kirby   DOB: 1945-12-17   74 y.o. Female  MRN: [id number] Visit Date: 12/31/2019  Today's healthcare provider: Lelon Huh, MD   Chief Complaint  Patient presents with   Follow-up   Weakness   Subjective    HPI  Follow up for weakness and dyspnea on exertion:  The patient was last seen for this 11/20/2019. Management during that visit includes ordering labs which showed elevated BNP. Chest x ray was ordered and was normal except for changes related to emphysema in the lungs. There was no sign of heart failure or fluid buildup. Patient advised to be sure to drink plenty of fluids to help kidneys and to follow up in 4-6 weeks to check on your blood pressure.   She reports good compliance with treatment. She feels that condition is Improved.  Patient reports still having weakness in her legs and extreme fatigue. She doesn't feel like she has trouble sleeping at night but does fall asleep easily during the day.    ----------------------------------------------------------------------------------------- She also complains of constipation and has been taking OTC stool softeners and pro-biotics for long period of time which has not been effective.      Medications: Outpatient Medications Prior to Visit  Medication Sig   acetaminophen (TYLENOL) 500 MG tablet Take 500 mg by mouth every 6 (six) hours as needed.   albuterol (VENTOLIN HFA) 108 (90 Base) MCG/ACT inhaler Inhale 2 puffs into the lungs every 4 (four) hours as needed for wheezing or shortness of breath.   amLODipine (NORVASC) 5 MG tablet Take 2.5 mg by mouth daily.   aspirin (ASPIR-81) 81 MG EC tablet Take 81 mg by mouth daily.     clopidogrel (PLAVIX) 75 MG tablet Take 1 tablet (75 mg total) by mouth daily.   cyanocobalamin 1000 MCG tablet Take 1,000 mcg by mouth daily.   ezetimibe (ZETIA) 10 MG tablet Take 1 tablet (10 mg total) by mouth daily.   fluticasone  furoate-vilanterol (BREO ELLIPTA) 100-25 MCG/INH AEPB Inhale 1 puff into the lungs daily.   HYDROcodone-acetaminophen (NORCO/VICODIN) 5-325 MG tablet Take 1 tablet by mouth every 6 (six) hours as needed.   insulin glargine (LANTUS) 100 UNIT/ML injection Inject 22 Units into the skin daily.    Liraglutide (VICTOZA) 18 MG/3ML SOLN injection Inject 1.8 mg into the skin daily.    lisinopril (ZESTRIL) 20 MG tablet Take 1 tablet (20 mg total) by mouth daily.   metoprolol tartrate (LOPRESSOR) 100 MG tablet Take 1 tablet (100 mg total) by mouth 2 (two) times daily.   Multiple Vitamins-Minerals (DAILY MULTI) TABS Take 1 tablet by mouth daily.    nitroGLYCERIN (NITROSTAT) 0.4 MG SL tablet Place 1 tablet (0.4 mg total) under the tongue every 5 (five) minutes as needed.   Omega-3 Fatty Acids (FISH OIL) 1000 MG CAPS Take 2 capsules by mouth in the morning and at bedtime.    ondansetron (ZOFRAN ODT) 4 MG disintegrating tablet Take 1 tablet (4 mg total) by mouth every 8 (eight) hours as needed for nausea or vomiting.   RABEprazole (ACIPHEX) 20 MG tablet Take 1 tablet (20 mg total) by mouth daily. 15 Mins. before evening meal   rosuvastatin (CRESTOR) 10 MG tablet Take 1 tablet (10 mg total) by mouth daily.   sucralfate (CARAFATE) 1 g tablet Take 1 tablet (1 g total) by mouth 2 (two) times daily. 15 Minutes before evening meal and at bed time   tiotropium (SPIRIVA)  18 MCG inhalation capsule Place 1 capsule (18 mcg total) into inhaler and inhale daily.   No facility-administered medications prior to visit.    Review of Systems  Constitutional: Positive for fatigue. Negative for appetite change, chills and fever.  Respiratory: Negative for chest tightness and shortness of breath.   Cardiovascular: Negative for chest pain and palpitations.  Gastrointestinal: Negative for abdominal pain, nausea and vomiting.  Neurological: Positive for weakness. Negative for dizziness.      Objective    BP  125/78 (BP Location: Left Arm, Patient Position: Sitting, Cuff Size: Large)    Pulse 69    Temp 98.4 F (36.9 C) (Oral)    Resp 16    Wt 173 lb (78.5 kg)    SpO2 94% Comment: room air   BMI 28.79 kg/m    Physical Exam   General: Appearance:     Well developed, well nourished female in no acute distress  Eyes:    PERRL, conjunctiva/corneas clear, EOM's intact       Lungs:     Clear to auscultation bilaterally, respirations unlabored  Heart:    Normal heart rate. Normal rhythm. No murmurs, rubs, or gallops.   MS:   All extremities are intact.   Neurologic:   Awake, alert, oriented x 3. No apparent focal neurological           defect.        Assessment & Plan     1. Hypersomnia/fatigue Labs from last visit normal except for elevated BNP, but no sign of CHF on follow up CXR, no edema, and normal EF on echo from June.  Need to rule out OSA.  - Home sleep test  2. Essential hypertension Well controlled.  Continue current medications.    3. Constipation, unspecified constipation type Recommend daily OTC powdered fiber supplement and prn Miralax. Is UTD on colon cancer screening.         The entirety of the information documented in the History of Present Illness, Review of Systems and Physical Exam were personally obtained by me. Portions of this information were initially documented by the CMA and reviewed by me for thoroughness and accuracy.      Lelon Huh, MD  [facility] [phone] (phone) [phone] (fax)  [place]

## 2019-12-31 ENCOUNTER — Encounter: Payer: Self-pay | Admitting: Family Medicine

## 2019-12-31 ENCOUNTER — Ambulatory Visit (INDEPENDENT_AMBULATORY_CARE_PROVIDER_SITE_OTHER): Payer: Medicare Other | Admitting: Family Medicine

## 2019-12-31 ENCOUNTER — Other Ambulatory Visit: Payer: Self-pay

## 2019-12-31 VITALS — BP 125/78 | HR 69 | Temp 98.4°F | Resp 16 | Wt 173.0 lb

## 2019-12-31 DIAGNOSIS — I639 Cerebral infarction, unspecified: Secondary | ICD-10-CM | POA: Diagnosis not present

## 2019-12-31 DIAGNOSIS — I10 Essential (primary) hypertension: Secondary | ICD-10-CM

## 2019-12-31 DIAGNOSIS — G4710 Hypersomnia, unspecified: Secondary | ICD-10-CM

## 2019-12-31 DIAGNOSIS — K5900 Constipation, unspecified: Secondary | ICD-10-CM

## 2019-12-31 NOTE — Patient Instructions (Signed)
.   Please review the attached list of medications and notify my office if there are any errors.   . Start taking OTC metamucil powder every evening along with an OTC stool softener such as docusate. You can also take OTC Miralax periodically for constipation.

## 2020-01-02 ENCOUNTER — Ambulatory Visit (INDEPENDENT_AMBULATORY_CARE_PROVIDER_SITE_OTHER): Payer: Medicare Other

## 2020-01-02 ENCOUNTER — Other Ambulatory Visit: Payer: Self-pay

## 2020-01-02 DIAGNOSIS — Z23 Encounter for immunization: Secondary | ICD-10-CM | POA: Diagnosis not present

## 2020-01-23 ENCOUNTER — Encounter: Payer: Self-pay | Admitting: Family Medicine

## 2020-01-23 DIAGNOSIS — F1190 Opioid use, unspecified, uncomplicated: Secondary | ICD-10-CM

## 2020-01-23 DIAGNOSIS — M48061 Spinal stenosis, lumbar region without neurogenic claudication: Secondary | ICD-10-CM

## 2020-01-23 MED ORDER — HYDROCODONE-ACETAMINOPHEN 5-325 MG PO TABS: 1.0000 | ORAL_TABLET | Freq: Four times a day (QID) | ORAL | 0 refills | Status: DC | PRN

## 2020-02-07 DIAGNOSIS — M4186 Other forms of scoliosis, lumbar region: Secondary | ICD-10-CM | POA: Diagnosis not present

## 2020-02-07 DIAGNOSIS — M5116 Intervertebral disc disorders with radiculopathy, lumbar region: Secondary | ICD-10-CM | POA: Diagnosis not present

## 2020-02-07 DIAGNOSIS — M2578 Osteophyte, vertebrae: Secondary | ICD-10-CM | POA: Diagnosis not present

## 2020-02-07 DIAGNOSIS — I700 Atherosclerosis of aorta: Secondary | ICD-10-CM | POA: Diagnosis not present

## 2020-02-07 DIAGNOSIS — M25551 Pain in right hip: Secondary | ICD-10-CM | POA: Diagnosis not present

## 2020-02-10 ENCOUNTER — Other Ambulatory Visit: Payer: Self-pay | Admitting: Nurse Practitioner

## 2020-02-10 DIAGNOSIS — G8929 Other chronic pain: Secondary | ICD-10-CM

## 2020-02-10 DIAGNOSIS — M5416 Radiculopathy, lumbar region: Secondary | ICD-10-CM

## 2020-02-11 ENCOUNTER — Ambulatory Visit (INDEPENDENT_AMBULATORY_CARE_PROVIDER_SITE_OTHER): Payer: Medicare Other | Admitting: Vascular Surgery

## 2020-02-11 ENCOUNTER — Other Ambulatory Visit: Payer: Self-pay

## 2020-02-11 ENCOUNTER — Encounter (INDEPENDENT_AMBULATORY_CARE_PROVIDER_SITE_OTHER): Payer: Self-pay | Admitting: Vascular Surgery

## 2020-02-11 ENCOUNTER — Ambulatory Visit (INDEPENDENT_AMBULATORY_CARE_PROVIDER_SITE_OTHER): Payer: Medicare Other

## 2020-02-11 VITALS — BP 154/86 | HR 73 | Ht 65.0 in | Wt 170.0 lb

## 2020-02-11 DIAGNOSIS — I714 Abdominal aortic aneurysm, without rupture, unspecified: Secondary | ICD-10-CM

## 2020-02-11 DIAGNOSIS — I639 Cerebral infarction, unspecified: Secondary | ICD-10-CM | POA: Diagnosis not present

## 2020-02-11 DIAGNOSIS — I10 Essential (primary) hypertension: Secondary | ICD-10-CM | POA: Diagnosis not present

## 2020-02-11 DIAGNOSIS — N1832 Chronic kidney disease, stage 3b: Secondary | ICD-10-CM | POA: Diagnosis not present

## 2020-02-11 DIAGNOSIS — E7849 Other hyperlipidemia: Secondary | ICD-10-CM

## 2020-02-11 NOTE — Assessment & Plan Note (Signed)
Her duplex today shows slight increase in the maximal aortic aneurysm diameter now measuring 3.6 cm.  Previously this was 3.4 cm.  No role for intervention at this size.  We discussed the pathophysiology and natural history of abdominal aortic aneurysm.  Continue to follow on an annual basis.

## 2020-02-11 NOTE — Assessment & Plan Note (Signed)
blood pressure control important in reducing the progression of atherosclerotic disease and aneurysmal growth. On appropriate oral medications.  

## 2020-02-11 NOTE — Progress Notes (Signed)
MRN : [id number]  Hannah Kirby is a 74 y.o. (1945/05/15) female who presents with chief complaint of  Chief Complaint  Patient presents with  . AAA  . Follow-up    U/S  .  History of Present Illness: Patient returns today in follow up of her abdominal aortic aneurysm.  She is doing well and does not have any aneurysm related symptoms.  She has had a stroke since her last visit which caused some left-sided weakness which has gradually improved but not gone back to baseline.  She says her carotid arteries were checked at the time of her stroke and were okay.  Her duplex today shows slight increase in the maximal aortic aneurysm diameter now measuring 3.6 cm.  Previously this was 3.4 cm.  Current Outpatient Medications  Medication Sig Dispense Refill  . acetaminophen (TYLENOL) 500 MG tablet Take 500 mg by mouth every 6 (six) hours as needed.    Marland Kitchen albuterol (VENTOLIN HFA) 108 (90 Base) MCG/ACT inhaler Inhale 2 puffs into the lungs every 4 (four) hours as needed for wheezing or shortness of breath. 48 g 3  . amLODipine (NORVASC) 5 MG tablet Take 2.5 mg by mouth daily.    Marland Kitchen aspirin (ASPIR-81) 81 MG EC tablet Take 81 mg by mouth daily.    . clopidogrel (PLAVIX) 75 MG tablet Take 1 tablet (75 mg total) by mouth daily. 90 tablet 3  . cyanocobalamin 1000 MCG tablet Take 1,000 mcg by mouth daily.    Marland Kitchen ezetimibe (ZETIA) 10 MG tablet Take 1 tablet (10 mg total) by mouth daily. 90 tablet 3  . fluticasone furoate-vilanterol (BREO ELLIPTA) 100-25 MCG/INH AEPB Inhale 1 puff into the lungs daily. 84 each 3  . HYDROcodone-acetaminophen (NORCO/VICODIN) 5-325 MG tablet Take 1 tablet by mouth every 6 (six) hours as needed. 120 tablet 0  . insulin glargine (LANTUS) 100 UNIT/ML injection Inject 22 Units into the skin daily.     Marland Kitchen lisinopril (ZESTRIL) 20 MG tablet Take 1 tablet (20 mg total) by mouth daily. 90 tablet 3  . metoprolol tartrate (LOPRESSOR) 100 MG tablet Take 1 tablet (100 mg total) by mouth 2  (two) times daily. 180 tablet 3  . Multiple Vitamins-Minerals (DAILY MULTI) TABS Take 1 tablet by mouth daily.    . nitroGLYCERIN (NITROSTAT) 0.4 MG SL tablet Place 1 tablet (0.4 mg total) under the tongue every 5 (five) minutes as needed. 25 tablet 6  . Omega-3 Fatty Acids (FISH OIL) 1000 MG CAPS Take 2 capsules by mouth in the morning and at bedtime.     . ondansetron (ZOFRAN ODT) 4 MG disintegrating tablet Take 1 tablet (4 mg total) by mouth every 8 (eight) hours as needed for nausea or vomiting. 20 tablet 0  . RABEprazole (ACIPHEX) 20 MG tablet Take 1 tablet (20 mg total) by mouth daily. 15 Mins. before evening meal 90 tablet 3  . rosuvastatin (CRESTOR) 10 MG tablet Take 1 tablet (10 mg total) by mouth daily. 90 tablet 3  . sucralfate (CARAFATE) 1 g tablet Take 1 tablet (1 g total) by mouth 2 (two) times daily. 15 Minutes before evening meal and at bed time 180 tablet 3  . tiotropium (SPIRIVA) 18 MCG inhalation capsule Place 1 capsule (18 mcg total) into inhaler and inhale daily. 90 capsule 4  . Liraglutide (VICTOZA) 18 MG/3ML SOLN injection Inject 1.8 mg into the skin daily.      No current facility-administered medications for this visit.    Past  Medical History:  Diagnosis Date  . Anemia   . Arthritis   . CAD (coronary artery disease)    a. cath 02/2006: BMS x 2 to RCA, cath o/w without significant coronary disease; b. nuclear stress test 07/2014: no signs of ischemia, no ekg changes concerning for ischemia, low risk study/normal study  . Chronic bronchitis ([place])    secondary to cigarette smoking  . Chronic kidney disease (CKD), stage III (moderate) (HCC)   . COPD (chronic obstructive pulmonary disease) ([place])   . Diabetes mellitus   . FHx: allergies   . GERD (gastroesophageal reflux disease)   . Goiter   . Granulomatous disease ([place])   . Hernia   . Hyperlipidemia   . Hypertension   . Kidney stone on left side 2013  . Microalbuminuria   . Obesity   . Smokers' cough ([place])   .  Spinal stenosis   . Stroke ([place]) 10/29/2016   mild left side weakness  . Stroke [facility]) 08/01/2019    Past Surgical History:  Procedure Laterality Date  . ABDOMINAL HYSTERECTOMY    . BREAST SURGERY    . CATARACT EXTRACTION W/PHACO Right 03/01/2017   Procedure: CATARACT EXTRACTION PHACO AND INTRAOCULAR LENS PLACEMENT ([place]) RIGHT DIABETIC;  Surgeon: Leandrew Koyanagi, MD;  Location: [facility];  Service: Ophthalmology;  Laterality: Right;  . CATARACT EXTRACTION W/PHACO Left 03/22/2017   Procedure: CATARACT EXTRACTION PHACO AND INTRAOCULAR LENS PLACEMENT ([place]) LEFT DIABETIC;  Surgeon: Leandrew Koyanagi, MD;  Location: [facility];  Service: Ophthalmology;  Laterality: Left;  Diabetic - insulin and oral meds  . COLONOSCOPY WITH PROPOFOL N/A 09/23/2014   Procedure: COLONOSCOPY WITH PROPOFOL;  Surgeon: Lollie Sails, MD;  Location: [facility] ENDOSCOPY;  Service: Endoscopy;  Laterality: N/A;  . COLONOSCOPY WITH PROPOFOL N/A 01/11/2018   Procedure: COLONOSCOPY WITH PROPOFOL;  Surgeon: Lollie Sails, MD;  Location: [facility] ENDOSCOPY;  Service: Endoscopy;  Laterality: N/A;  . COLONOSCOPY WITH PROPOFOL N/A 04/24/2018   Procedure: COLONOSCOPY WITH PROPOFOL;  Surgeon: Lollie Sails, MD;  Location: [facility] ENDOSCOPY;  Service: Endoscopy;  Laterality: N/A;  . COLONOSCOPY WITH PROPOFOL N/A 11/19/2019   Procedure: COLONOSCOPY WITH PROPOFOL;  Surgeon: Lucilla Lame, MD;  Location: [facility] ENDOSCOPY;  Service: Endoscopy;  Laterality: N/A;  . CORONARY ANGIOPLASTY WITH STENT PLACEMENT  2008  . ESOPHAGOGASTRODUODENOSCOPY (EGD) WITH PROPOFOL N/A 12/30/2014   Procedure: ESOPHAGOGASTRODUODENOSCOPY (EGD) WITH PROPOFOL;  Surgeon: Lollie Sails, MD;  Location: [facility] ENDOSCOPY;  Service: Endoscopy;  Laterality: N/A;  . ESOPHAGOGASTRODUODENOSCOPY (EGD) WITH PROPOFOL N/A 07/19/2016   Procedure: ESOPHAGOGASTRODUODENOSCOPY (EGD) WITH PROPOFOL;  Surgeon: Lollie Sails, MD;  Location: [facility] ENDOSCOPY;   Service: Endoscopy;  Laterality: N/A;  . ESOPHAGOGASTRODUODENOSCOPY (EGD) WITH PROPOFOL N/A 04/24/2018   Procedure: ESOPHAGOGASTRODUODENOSCOPY (EGD) WITH PROPOFOL;  Surgeon: Lollie Sails, MD;  Location: [facility] ENDOSCOPY;  Service: Endoscopy;  Laterality: N/A;  . hysterectomy (other)       Social History   Tobacco Use  . Smoking status: Current Every Day Smoker    Packs/day: 0.75    Years: 40.00    Pack years: 30.00    Types: Cigarettes  . Smokeless tobacco: Never Used  Vaping Use  . Vaping Use: Former  Substance Use Topics  . Alcohol use: Yes    Comment: 1/month- wine  . Drug use: No       Family History  Problem Relation Age of Onset  . Heart failure Mother   . Stroke Mother   . Diabetes Mother   .  Congestive Heart Failure Mother   . Lung cancer Father   . Breast cancer Maternal Aunt      Allergies  Allergen Reactions  . Spironolactone Shortness Of Breath  . Sulfonamide Derivatives Other (See Comments)    Last taken as a child; made her mouth break out  . Other Other (See Comments)    Mouth blisters  . Sulfa Antibiotics Other (See Comments)    Mouth blisters Welts on mouth   . Codeine Nausea And Vomiting and Other (See Comments)    Nausea  . Prednisone Palpitations and Other (See Comments)    Made her legs "feel weird."   REVIEW OF SYSTEMS(Negative unless checked)  Constitutional: _0 ???Weight loss_1 ???Fever_2 ???Chills Cardiac:_3 ???Chest pain_4 ???Chest pressure_5 ???Palpitations _6 ???Shortness of breath when laying flat _7 ???Shortness of breath at rest _8 ???Shortness of breath with exertion. Vascular: _9 ???Pain in legs with walking_10 ???Pain in legsat rest_11 ???Pain in legs when laying flat _12 ???Claudication _13 ???Pain in feet when walking _14 ???Pain in feet at rest _15 ???Pain in feet when laying flat _16 ???History of DVT _17 ???Phlebitis _18 ???Swelling in legs _19 ???Varicose veins _20 ???Non-healing ulcers Pulmonary:  _21 ???Uses home oxygen _22 ???Productive cough_23 ???Hemoptysis _24 ???Wheeze _25 ???COPD _26 ???Asthma Neurologic: _27 ???Dizziness _28 ???Blackouts _29 ???Seizures _30 ???History of stroke _31 ???History of TIA_32 ???Aphasia _33 ???Temporary blindness_34 ???Dysphagia _35 ???Weaknessor numbness in arms _36 ???Weakness or numbnessin legs Musculoskeletal: _37 ???Arthritis _38 ???Joint swelling _39 ???Joint pain _40 ???Low back pain Hematologic:_41 ???Easy bruising_42 ???Easy bleeding _43 ???Hypercoagulable state _44 ???Anemic _45 ???Hepatitis Gastrointestinal:_46 ???Blood in stool_47 ???Vomiting blood_48 ???Gastroesophageal reflux/heartburn_49 ???Abdominal pain Genitourinary: _50 ???Chronic kidney disease _51 ???Difficulturination _52 ???Frequenturination _53 ???Burning with urination_54 ???Hematuria Skin: _55 ???Rashes _56 ???Ulcers _57 ???Wounds Psychological: _58 ???History of anxiety_59 ???History of major depression.   Physical Examination  BP (!) 154/86   Pulse 73   Ht _60  (1.651 m)   Wt 170 lb (77.1 kg)   BMI 28.29 kg/m  Gen:  WD/WN, NAD Head: [place]/AT, No temporalis wasting. Ear/Nose/Throat: Hearing grossly intact, nares w/o erythema or drainage Eyes: Conjunctiva clear. Sclera non-icteric Neck: Supple.  Trachea midline Pulmonary:  Good air movement, no use of accessory muscles.  Cardiac: RRR, no JVD Vascular:  Vessel Right Left  Radial Palpable Palpable       Gastrointestinal: soft, non-tender/non-distended. No guarding/reflex. Mildly increased aortic impulse  Musculoskeletal:  No deformity or atrophy. No edema. Neurologic: Sensation grossly intact in extremities. Slight left sided weakness. Speech is fluent.  Psychiatric: Judgment intact, Mood & affect appropriate for pt's clinical situation. Dermatologic: No rashes or ulcers noted.  No cellulitis or open wounds.       Labs Recent Results (from the past 2160 hour(s))  SARS CORONAVIRUS 2 (TAT 6-24 HRS)  Nasopharyngeal Nasopharyngeal Swab     Status: None   Collection Time: 11/15/19 12:21 PM   Specimen: Nasopharyngeal Swab  Result Value Ref Range   SARS Coronavirus 2 NEGATIVE NEGATIVE    Comment: (NOTE) SARS-CoV-2 target nucleic acids are NOT DETECTED.  The SARS-CoV-2 RNA is generally detectable in upper and lower respiratory specimens during the acute phase of infection. Negative results do not preclude SARS-CoV-2 infection, do not rule out co-infections with other pathogens, and should not be used as the sole basis for treatment or other patient management decisions. Negative results must be combined with clinical observations, patient history, and epidemiological information. The expected result is Negative.  Fact Sheet for Patients: SugarRoll.be  Fact Sheet for Healthcare Providers: https://www.woods-mathews.com/  This test is not yet approved or cleared by the [place] FDA and  has been authorized for detection and/or diagnosis of SARS-CoV-2 by FDA under an Emergency Use Authorization (EUA). This EUA will remain  in effect (meaning this test can be used) for the duration of the COVID-19 declaration under  Se ction 564(b)(1) of the Act, 21 U.S.C. section 360bbb-3(b)(1), unless the authorization is terminated or revoked sooner.  Performed at [facility], [place] [address] [place], [place] [address]   Glucose, capillary     Status: Abnormal   Collection Time: 11/19/19  8:19 AM  Result Value Ref Range   Glucose-Capillary 162 (H) 70 - 99 mg/dL    Comment: Glucose reference range applies only to samples taken after fasting for at least 8 hours.  Surgical pathology     Status: None   Collection Time: 11/19/19  9:12 AM  Result Value Ref Range   SURGICAL PATHOLOGY      SURGICAL PATHOLOGY CASE: [id number] PATIENT: [facility] Report     Specimen Submitted: A. Colon polyp, ascending; cold  snare  Clinical History: Hx of colon polyps Z86.010.  AVM at ascending colon, colon polyp, internal hemorrhoids.      DIAGNOSIS: A. COLON POLYP, ASCENDING; COLD SNARE: - TUBULAR ADENOMA. - NEGATIVE FOR HIGH-GRADE DYSPLASIA AND MALIGNANCY.   GROSS DESCRIPTION: A. Labeled: Cold snare ascending colon polyp Received: Formalin Tissue fragment(s): Multiple Size: Aggregate, 1.7 x 1 x 0.3 cm Description: Received are fragments of tan soft tissue admixed with fecal matter.  The ratio of soft tissue to fecal matter is 80: 20. Entirely submitted in 1 cassette.     Final Diagnosis performed by Betsy Pries, MD.   Electronically signed 11/20/2019 8:57:17AM The electronic signature indicates that the named Attending Pathologist has evaluated the specimen Technical component performed at [facility], [address] [place], [place] [address] Lab: [phone] Dir: Rush Farmer, MD, MMM  Professional component performed at [facility], [facility], [place], [place], [place] [address] Lab: [phone] Dir: Dellia Nims. Rubinas, MD   POCT glycosylated hemoglobin (Hb A1C)     Status: Abnormal   Collection Time: 11/20/19 10:36 AM  Result Value Ref Range   Hemoglobin A1C 8.2 (A) 4.0 - 5.6 %   HbA1c POC (<> result, manual entry)     HbA1c, POC (prediabetic range)     HbA1c, POC (controlled diabetic range)    CBC     Status: Abnormal   Collection Time: 11/20/19 10:39 AM  Result Value Ref Range   WBC 10.2 3.4 - 10.8 x10E3/uL   RBC 5.21 3.77 - 5.28 x10E6/uL   Hemoglobin 17.4 (H) 11.1 - 15.9 g/dL   Hematocrit 49.3 (H) 34.0 - 46.6 %   MCV 95 79 - 97 fL   MCH 33.4 (H) 26.6 - 33.0 pg   MCHC 35.3 31.5 - 35.7 g/dL   RDW 12.7 11.7 - 15.4 %   Platelets 214 150 - 450 x10E3/uL  Comprehensive metabolic panel     Status: Abnormal   Collection Time: 11/20/19 10:39 AM  Result Value Ref Range   Glucose 140 (H) 65 - 99 mg/dL   BUN 16 8 - 27 mg/dL   Creatinine, Ser 1.38 (H) 0.57 -  1.00 mg/dL   GFR calc non Af Amer 38 (L) >59 mL/min/1.73   GFR calc Af Amer 44 (L) >59 mL/min/1.73    Comment: **[facility] currently reports eGFR in compliance with the current**   recommendations of the [employer]. [facility] will   update reporting as new guidelines are published from the NKF-ASN   Task force.    BUN/Creatinine Ratio 12 12 - 28   Sodium 144 134 - 144 mmol/L   Potassium 3.6 3.5 - 5.2 mmol/L   Chloride 102 96 -  106 mmol/L   CO2 26 20 - 29 mmol/L   Calcium 10.3 8.7 - 10.3 mg/dL   Total Protein 6.9 6.0 - 8.5 g/dL   Albumin 4.2 3.7 - 4.7 g/dL   Globulin, Total 2.7 1.5 - 4.5 g/dL   Albumin/Globulin Ratio 1.6 1.2 - 2.2   Bilirubin Total 0.4 0.0 - 1.2 mg/dL   Alkaline Phosphatase 113 44 - 121 IU/L    Comment:               **Please note reference interval change**   AST 25 0 - 40 IU/L   ALT 23 0 - 32 IU/L  TSH     Status: None   Collection Time: 11/20/19 10:39 AM  Result Value Ref Range   TSH 3.600 0.450 - 4.500 uIU/mL  Brain natriuretic peptide     Status: Abnormal   Collection Time: 11/20/19 10:39 AM  Result Value Ref Range   BNP 217.5 (H) 0.0 - 100.0 pg/mL    Radiology No results found.  Assessment/Plan Chronic kidney disease, stage 3 Try to avoid contrast with evaluation as much as possible. Plan duplex  Hyperlipidemia lipid control important in reducing the progression of atherosclerotic disease. Continue statin therapy   Essential hypertension blood pressure control important in reducing the progression of atherosclerotic disease and aneurysmal growth. On appropriate oral medications.   AAA (abdominal aortic aneurysm) without rupture (HCC) Her duplex today shows slight increase in the maximal aortic aneurysm diameter now measuring 3.6 cm.  Previously this was 3.4 cm.  No role for intervention at this size.  We discussed the pathophysiology and natural history of abdominal aortic aneurysm.  Continue to follow on an annual  basis.    Leotis Pain, MD  02/11/2020 9:58 AM    This note was created with Dragon medical transcription system.  Any errors from dictation are purely unintentional

## 2020-02-11 NOTE — Patient Instructions (Signed)
Abdominal Aortic Aneurysm  An aneurysm is a bulge in one of the blood vessels that carry blood away from the heart (artery). It happens when blood pushes up against a weak or damaged place in the wall of an artery. An abdominal aortic aneurysm happens in the main artery of the body (aorta). Some aneurysms may not cause problems. If it grows, it can burst or tear, causing bleeding inside the body. This is an emergency. It needs to be treated right away. What are the causes? The exact cause of this condition is not known. What increases the risk? The following may make you more likely to get this condition:  Being a female who is 60 years of age or older.  Being white (Caucasian).  Using tobacco.  Having a family history of aneurysms.  Having the following conditions: ? Hardening of the arteries (arteriosclerosis). ? Inflammation of the walls of an artery (arteritis). ? Certain genetic conditions. ? Being very overweight (obesity). ? An infection in the wall of the aorta (infectious aortitis). ? High cholesterol. ? High blood pressure (hypertension). What are the signs or symptoms? Symptoms depend on the size of the aneurysm and how fast it is growing. Most grow slowly and do not cause any symptoms. If symptoms do occur, they may include:  Pain in the belly (abdomen), side, or back.  Feeling full after eating only small amounts of food.  Feeling a throbbing lump in the belly. Symptoms that the aneurysm has burst (ruptured) include:  Sudden, very bad pain in the belly, side, or back.  Feeling sick to your stomach (nauseous).  Throwing up (vomiting).  Feeling light-headed or passing out. How is this treated? Treatment for this condition depends on:  The size of the aneurysm.  How fast it is growing.  Your age.  Your risk of having it burst. If your aneurysm is smaller than 2 inches (5 cm), your doctor may manage it by:  Checking it often to see if it is getting bigger.  You may have an imaging test (ultrasound) to check it every 3-6 months, every year, or every few years.  Giving you medicines to: ? Control blood pressure. ? Treat pain. ? Fight infection. If your aneurysm is larger than 2 inches (5 cm), you may need surgery to fix it. Follow these instructions at home: Lifestyle  Do not use any products that have nicotine or tobacco in them. This includes cigarettes, e-cigarettes, and chewing tobacco. If you need help quitting, ask your doctor.  Get regular exercise. Ask your doctor what types of exercise are best for you. Eating and drinking  Eat a heart-healthy diet. This includes eating plenty of: ? Fresh fruits and vegetables. ? Whole grains. ? Low-fat (lean) protein. ? Low-fat dairy products.  Avoid foods that are high in saturated fat and cholesterol. These foods include red meat and some dairy products.  Do not drink alcohol if: ? Your doctor tells you not to drink. ? You are pregnant, may be pregnant, or are planning to become pregnant.  If you drink alcohol: ? Limit how much you use to:  0-1 drink a day for women.  0-2 drinks a day for men. ? Be aware of how much alcohol is in your drink. In the [place] one drink equals any of these:  One typical bottle of beer (12 oz).  One-half glass of wine (5 oz).  One shot of hard liquor (1 oz). General instructions  Take over-the-counter and prescription medicines only as   told by your doctor.  Keep your blood pressure within normal limits. Ask your doctor what your blood pressure should be.  Have your blood sugar (glucose) level and cholesterol levels checked regularly. Keep your blood sugar level and cholesterol levels within normal limits.  Avoid heavy lifting and activities that take a lot of effort. Ask your doctor what activities are safe for you.  Keep all follow-up visits as told by your doctor. This is important. ? Talk to your doctor about regular screenings to see if the  aneurysm is getting bigger. Contact a doctor if you:  Have pain in your belly, side, or back.  Have a throbbing feeling in your belly.  Have a family history of aneurysms. Get help right away if you:  Have sudden, bad pain in your belly, side, or back.  Feel sick to your stomach.  Throw up.  Have trouble pooping (constipation).  Have trouble peeing (urinating).  Feel light-headed.  Have a fast heart rate when you stand.  Have sweaty skin that is cold to the touch (clammy).  Have shortness of breath.  Have a fever. These symptoms may be an emergency. Do not wait to see if the symptoms will go away. Get medical help right away. Call your local emergency services (911 in the [place] Do not drive yourself to the hospital. Summary  An aneurysm is a bulge in one of the blood vessels that carry blood away from the heart (artery). Some aneurysms may not cause problems.  You may need to have yours checked often. If it grows, it can burst or tear. This causes bleeding inside the body. It needs to be treated right away.  Follow instructions from your doctor about healthy lifestyle changes.  Keep all follow-up visits as told by your doctor. This is important. This information is not intended to replace advice given to you by your health care provider. Make sure you discuss any questions you have with your health care provider. Document Revised: 05/28/2018 Document Reviewed: 09/16/2017 Elsevier Patient Education  2020 [employer]  

## 2020-02-17 NOTE — Progress Notes (Signed)
Subjective:   Hannah Kirby is a 74 y.o. female who presents for Medicare Annual (Subsequent) preventive examination.  I connected with Hannah Kirby today by telephone and verified that I am speaking with the correct person using two identifiers. Location patient: home Location provider: work Persons participating in the virtual visit: patient, provider.   I discussed the limitations, risks, security and privacy concerns of performing an evaluation and management service by telephone and the availability of in person appointments. I also discussed with the patient that there may be a patient responsible charge related to this service. The patient expressed understanding and verbally consented to this telephonic visit.    Interactive audio and video telecommunications were attempted between this provider and patient, however failed, due to patient having technical difficulties OR patient did not have access to video capability.  We continued and completed visit with audio only.   Review of Systems    N/A  Cardiac Risk Factors include: advanced age (>80men, >89 women);diabetes mellitus;dyslipidemia;hypertension;smoking/ tobacco exposure     Objective:    Today's Vitals   02/18/20 1417  PainSc: 8    There is no height or weight on file to calculate BMI.  Advanced Directives 02/18/2020 11/19/2019 08/07/2019 08/01/2019 04/24/2018 01/11/2018 10/17/2017  Does Patient Have a Medical Advance Directive? Yes No No No No No No  Type of Paramedic of [place];Living will - - - - - -  Copy of [place] in Chart? No - copy requested - - - - - -  Would patient like information on creating a medical advance directive? No - Patient declined - No - Patient declined - - - -    Current Medications (verified) Outpatient Encounter Medications as of 02/18/2020  Medication Sig  . acetaminophen (TYLENOL) 500 MG tablet Take 500 mg by mouth every 6 (six) hours  as needed.  Marland Kitchen albuterol (VENTOLIN HFA) 108 (90 Base) MCG/ACT inhaler Inhale 2 puffs into the lungs every 4 (four) hours as needed for wheezing or shortness of breath.  Marland Kitchen amLODipine (NORVASC) 5 MG tablet Take 2.5 mg by mouth daily.  Marland Kitchen aspirin (ASPIR-81) 81 MG EC tablet Take 81 mg by mouth daily.  . clopidogrel (PLAVIX) 75 MG tablet Take 1 tablet (75 mg total) by mouth daily.  . cyanocobalamin 1000 MCG tablet Take 1,000 mcg by mouth daily.  Marland Kitchen ezetimibe (ZETIA) 10 MG tablet Take 1 tablet (10 mg total) by mouth daily.  . fluticasone furoate-vilanterol (BREO ELLIPTA) 100-25 MCG/INH AEPB Inhale 1 puff into the lungs daily.  Marland Kitchen HYDROcodone-acetaminophen (NORCO/VICODIN) 5-325 MG tablet Take 1 tablet by mouth every 6 (six) hours as needed.  . insulin glargine (LANTUS) 100 UNIT/ML injection Inject 22 Units into the skin daily.   Marland Kitchen lisinopril (ZESTRIL) 20 MG tablet Take 1 tablet (20 mg total) by mouth daily.  . metoprolol tartrate (LOPRESSOR) 100 MG tablet Take 1 tablet (100 mg total) by mouth 2 (two) times daily.  . Multiple Vitamins-Minerals (DAILY MULTI) TABS Take 1 tablet by mouth daily.  . nitroGLYCERIN (NITROSTAT) 0.4 MG SL tablet Place 1 tablet (0.4 mg total) under the tongue every 5 (five) minutes as needed.  . Omega-3 Fatty Acids (FISH OIL) 1000 MG CAPS Take 2 capsules by mouth in the morning and at bedtime.   . ondansetron (ZOFRAN ODT) 4 MG disintegrating tablet Take 1 tablet (4 mg total) by mouth every 8 (eight) hours as needed for nausea or vomiting.  . RABEprazole (ACIPHEX) 20 MG  tablet Take 1 tablet (20 mg total) by mouth daily. 15 Mins. before evening meal  . rosuvastatin (CRESTOR) 10 MG tablet Take 1 tablet (10 mg total) by mouth daily.  . Semaglutide,0.25 or 0.5MG /DOS, 2 MG/1.5ML SOPN Inject 0.5 mg into the skin once a week.  . sucralfate (CARAFATE) 1 g tablet Take 1 tablet (1 g total) by mouth 2 (two) times daily. 15 Minutes before evening meal and at bed time  . tiotropium (SPIRIVA) 18 MCG  inhalation capsule Place 1 capsule (18 mcg total) into inhaler and inhale daily.  . Liraglutide (VICTOZA) 18 MG/3ML SOLN injection Inject 1.8 mg into the skin daily.  (Patient not taking: Reported on 02/18/2020)   No facility-administered encounter medications on file as of 02/18/2020.    Allergies (verified) Spironolactone, Sulfonamide derivatives, Other, Sulfa antibiotics, Codeine, and Prednisone   History: Past Medical History:  Diagnosis Date  . Anemia   . Arthritis   . CAD (coronary artery disease)    a. cath 02/2006: BMS x 2 to RCA, cath o/w without significant coronary disease; b. nuclear stress test 07/2014: no signs of ischemia, no ekg changes concerning for ischemia, low risk study/normal study  . Chronic bronchitis (HCC)    secondary to cigarette smoking  . Chronic kidney disease (CKD), stage III (moderate) (HCC)   . COPD (chronic obstructive pulmonary disease) (HCC)   . Diabetes mellitus   . FHx: allergies   . GERD (gastroesophageal reflux disease)   . Goiter   . Granulomatous disease (HCC)   . Hernia   . Hyperlipidemia   . Hypertension   . Kidney stone on left side 2013  . Microalbuminuria   . Obesity   . Smokers' cough (HCC)   . Spinal stenosis   . Stroke (HCC) 10/29/2016   mild left side weakness  . Stroke [facility]) 08/01/2019   Past Surgical History:  Procedure Laterality Date  . ABDOMINAL HYSTERECTOMY    . BREAST SURGERY    . CATARACT EXTRACTION W/PHACO Right 03/01/2017   Procedure: CATARACT EXTRACTION PHACO AND INTRAOCULAR LENS PLACEMENT (IOC) RIGHT DIABETIC;  Surgeon: Lockie Mola, MD;  Location: [facility];  Service: Ophthalmology;  Laterality: Right;  . CATARACT EXTRACTION W/PHACO Left 03/22/2017   Procedure: CATARACT EXTRACTION PHACO AND INTRAOCULAR LENS PLACEMENT (IOC) LEFT DIABETIC;  Surgeon: Lockie Mola, MD;  Location: [facility];  Service: Ophthalmology;  Laterality: Left;  Diabetic - insulin and oral meds  . COLONOSCOPY  WITH PROPOFOL N/A 09/23/2014   Procedure: COLONOSCOPY WITH PROPOFOL;  Surgeon: Christena Deem, MD;  Location: [facility] END[facility]COPY;  Service: Endoscopy;  Laterality: N/A;  . COLONOSCOPY WITH PROPOFOL N/A 01/11/2018   Procedure: COLONOSCOPY WITH PROPOFOL;  Surgeon: Christena Deem, MD;  Location: [facility] ENDOSCOPY;  Service: Endoscopy;  Laterality: N/A;  . COLONOSCOPY WITH PROPOFOL N/A 04/24/2018   Procedure: COLONOSCOPY WITH PROPOFOL;  Surgeon: Christena Deem, MD;  Location: [facility] ENDOSCOPY;  Service: Endoscopy;  Laterality: N/A;  . COLONOSCOPY WITH PROPOFOL N/A 11/19/2019   Procedure: COLONOSCOPY WITH PROPOFOL;  Surgeon: Midge Minium, MD;  Location: [facility] END[facility]COPY;  Service: Endoscopy;  Laterality: N/A;  . CORONARY ANGIOPLASTY WITH STENT PLACEMENT  2008  . ESOPHAGOGASTRODUODENOSCOPY (EGD) WITH PROPOFOL N/A 12/30/2014   Procedure: ESOPHAGOGASTRODUODENOSCOPY (EGD) WITH PROPOFOL;  Surgeon: Christena Deem, MD;  Location: [facility] ENDOSCOPY;  Service: Endoscopy;  Laterality: N/A;  . ESOPHAGOGASTRODUODENOSCOPY (EGD) WITH PROPOFOL N/A 07/19/2016   Procedure: ESOPHAGOGASTRODUODENOSCOPY (EGD) WITH PROPOFOL;  Surgeon: Christena Deem, MD;  Location: [facility] END[facility]COPY;  Service: Endoscopy;  Laterality: N/A;  . ESOPHAGOGASTRODUODENOSCOPY (EGD) WITH PROPOFOL N/A 04/24/2018   Procedure: ESOPHAGOGASTRODUODENOSCOPY (EGD) WITH PROPOFOL;  Surgeon: Lollie Sails, MD;  Location: [facility] ENDOSCOPY;  Service: Endoscopy;  Laterality: N/A;  . hysterectomy (other)     Family History  Problem Relation Age of Onset  . Heart failure Mother   . Stroke Mother   . Diabetes Mother   . Congestive Heart Failure Mother   . Lung cancer Father   . Breast cancer Maternal Aunt    Social History   Socioeconomic History  . Marital status: Married    Spouse name: Laureli Grondahl  . Number of children: 3  . Years of education: Not on file  . Highest education level: 12th grade  Occupational History  . Occupation: retired  Tobacco  Use  . Smoking status: Current Every Day Smoker    Packs/day: 0.75    Years: 40.00    Pack years: 30.00    Types: Cigarettes  . Smokeless tobacco: Never Used  Vaping Use  . Vaping Use: Former  Substance and Sexual Activity  . Alcohol use: Yes    Comment: 0-1 glasses of wine a month  . Drug use: No  . Sexual activity: Not on file  Other Topics Concern  . Not on file  Social History Narrative   Retired. Married. Regularly exercise.    Social Determinants of Health   Financial Resource Strain: Low Risk   . Difficulty of Paying Living Expenses: Not hard at all  Food Insecurity: No Food Insecurity  . Worried About Charity fundraiser in the Last Year: Never true  . Ran Out of Food in the Last Year: Never true  Transportation Needs: No Transportation Needs  . Lack of Transportation (Medical): No  . Lack of Transportation (Non-Medical): No  Physical Activity: Inactive  . Days of Exercise per Week: 0 days  . Minutes of Exercise per Session: 0 min  Stress: No Stress Concern Present  . Feeling of Stress : Only a little  Social Connections: Moderately Isolated  . Frequency of Communication with Friends and Family: More than three times a week  . Frequency of Social Gatherings with Friends and Family: Twice a week  . Attends Religious Services: Never  . Active Member of Clubs or Organizations: No  . Attends Archivist Meetings: Never  . Marital Status: Married    Tobacco Counseling Ready to quit: Not Answered Counseling given: Not Answered   Clinical Intake:  Pre-visit preparation completed: Yes  Pain : 0-10 [place] on 02/14/20.) Pain Score: 8  Pain Type: Acute pain Pain Location: Arm Pain Orientation: Right Pain Descriptors / Indicators: Aching Pain Frequency: Constant Pain Relieving Factors: Taking Tylenol and Hydrocodone as needed for pain.  Pain Relieving Factors: Taking Tylenol and Hydrocodone as needed for pain.  Nutritional Risks: None Diabetes:  Yes  How often do you need to have someone help you when you read instructions, pamphlets, or other written materials from your doctor or pharmacy?: 1 - Never  Diabetic? Yes  Nutrition Risk Assessment:  Has the patient had any N/V/D within the last 2 months?  No  Does the patient have any non-healing wounds?  No  Has the patient had any unintentional weight loss or weight gain?  No   Diabetes:  Is the patient diabetic?  Yes  If diabetic, was a CBG obtained today?  No  Did the patient bring in their glucometer from home?  No  How often do you monitor  your CBG's? Once a day.   Financial Strains and Diabetes Management:  Are you having any financial strains with the device, your supplies or your medication? No .  Does the patient want to be seen by Chronic Care Management for management of their diabetes?  No  Would the patient like to be referred to a Nutritionist or for Diabetic Management?  No   Diabetic Exams:  Diabetic Eye Exam: Overdue for diabetic eye exam. Pt has been advised about the importance in completing this exam. Pt has an apt with Dr Roosevelt Locks in 02/2020. Diabetic Foot Exam: Overdue, Pt has been advised about the importance in completing this exam. Note made to follow up on this at next in office apt.    Interpreter Needed?: No  Information entered by :: [facility], LPN   Activities of Daily Living In your present state of health, do you have any difficulty performing the following activities: 02/18/2020 11/20/2019  Hearing? Y N  Comment Does not wer hearing aids. -  Vision? N N  Difficulty concentrating or making decisions? N N  Walking or climbing stairs? N Y  Dressing or bathing? N N  Doing errands, shopping? N N  Preparing Food and eating ? N -  Using the Toilet? N -  In the past six months, have you accidently leaked urine? N -  Do you have problems with loss of bowel control? N -  Managing your Medications? N -  Managing your Finances? N -  Housekeeping  or managing your Housekeeping? N -  Some recent data might be hidden    Patient Care Team: Birdie Sons, MD as PCP - General (Family Medicine) Minna Merritts, MD as Consulting Physician (Cardiology) Dingeldein, Remo Lipps, MD as Consulting Physician (Ophthalmology) Lavonia Dana, MD as Consulting Physician (Nephrology) Lucky Cowboy Erskine Squibb, MD as Referring Physician (Vascular Surgery) Lonia Farber, MD as Consulting Physician (Endocrinology) Lucilla Lame, MD as Consulting Physician (Gastroenterology)  Indicate any recent Medical Services you may have received from other than [facility] providers in the past year (date may be approximate).     Assessment:   This is a routine wellness examination for Hannah Kirby.  Hearing/Vision screen No exam data present  Dietary issues and exercise activities discussed: Current Exercise Habits: The patient does not participate in regular exercise at present, Exercise limited by: neurologic condition(s);orthopedic condition(s)  Goals    . Increase water intake     Recommend increasing water intake to 4-6 glasses a day.    . Prevent falls     Recommend to remove any items from the home that may cause slips or trips.    . Quit Smoking     Recommend to continue efforts to reduce smoking habits until no longer smoking.       Depression Screen PHQ 2/9 Scores 02/18/2020 11/20/2019 08/16/2019 08/04/2017 08/03/2016 08/03/2016 07/27/2015  PHQ - 2 Score 0 1 1 0 0 0 0  PHQ- 9 Score - 8 - - 0 - -    Fall Risk Fall Risk  02/18/2020 11/20/2019 08/16/2019 08/04/2017 08/03/2016  Falls in the past year? 1 0 0 No No  Number falls in past yr: 0 - 0 - -  Injury with Fall? 1 - 0 - -  Follow up Falls prevention discussed - Falls evaluation completed - -    FALL RISK PREVENTION PERTAINING TO THE HOME:  Any stairs in or around the home? No  If so, are there any without handrails? No  Home free  of loose throw rugs in walkways, pet beds, electrical cords, etc? Yes   Adequate lighting in your home to reduce risk of falls? Yes   ASSISTIVE DEVICES UTILIZED TO PREVENT FALLS:  Life alert? No  Use of a cane, walker or w/c? No  Grab bars in the bathroom? Yes  Shower chair or bench in shower? Yes  Elevated toilet seat or a handicapped toilet? No    Cognitive Function: Normal cognitive status assessed by observation by this Nurse Health Advisor. No abnormalities found.       6CIT Screen 08/03/2016  What Year? 0 points  What month? 0 points  What time? 0 points  Count back from 20 0 points  Months in reverse 0 points  Repeat phrase 0 points  Total Score 0    Immunizations Immunization History  Administered Date(s) Administered  . Fluad Quad(high Dose 65+) 11/20/2019  . Influenza, High Dose Seasonal PF 11/01/2014, 11/24/2015, 11/08/2016, 11/22/2017  . Influenza-Unspecified 10/22/2013  . PFIZER SARS-COV-2 Vaccination 04/02/2019, 04/23/2019, 01/02/2020  . Pneumococcal Conjugate-13 05/22/2013  . Pneumococcal Polysaccharide-23 11/02/2011  . Zoster 04/23/2012    TDAP status: Due, Education has been provided regarding the importance of this vaccine. Advised may receive this vaccine at local pharmacy or Health Dept. Aware to provide a copy of the vaccination record if obtained from local pharmacy or Health Dept. Verbalized acceptance and understanding.  Flu Vaccine status: Up to date  Pneumococcal vaccine status: Up to date  Covid-19 vaccine status: Completed vaccines  Qualifies for Shingles Vaccine? Yes   Zostavax completed Yes   Shingrix Completed?: No.    Education has been provided regarding the importance of this vaccine. Patient has been advised to call insurance company to determine out of pocket expense if they have not yet received this vaccine. Advised may also receive vaccine at local pharmacy or Health Dept. Verbalized acceptance and understanding.  Screening Tests Health Maintenance  Topic Date Due  . DEXA SCAN  01/07/2014  .  FOOT EXAM  11/18/2016  . OPHTHALMOLOGY EXAM  12/31/2019  . TETANUS/TDAP  02/21/2026 (Originally 07/12/1964)  . HEMOGLOBIN A1C  05/19/2020  . COLONOSCOPY (Pts 45-48yrs Insurance coverage will need to be confirmed)  11/18/2024  . INFLUENZA VACCINE  Completed  . COVID-19 Vaccine  Completed  . Hepatitis C Screening  Completed  . PNA vac Low Risk Adult  Completed  . MAMMOGRAM  Discontinued    Health Maintenance  Health Maintenance Due  Topic Date Due  . DEXA SCAN  01/07/2014  . FOOT EXAM  11/18/2016  . OPHTHALMOLOGY EXAM  12/31/2019    Colorectal cancer screening: Type of screening: Colonoscopy. Completed 11/19/19. Repeat every 5 years  Mammogram status: Currently due, declined ordered at this time.  Bone Density status: Currently due, declined order at this time.   Lung Cancer Screening: (Low Dose CT Chest recommended if Age 24-80 years, 30 pack-year currently smoking OR have quit w/in 15years.) does qualify however declines scan at this time.   Additional Screening:  Hepatitis C Screening: Up to date  Vision Screening: Recommended annual ophthalmology exams for early detection of glaucoma and other disorders of the eye. Is the patient up to date with their annual eye exam?  Yes  Who is the provider or what is the name of the office in which the patient attends annual eye exams? Dr Dingeldein @ [place] If pt is not established with a provider, would they like to be referred to a provider to establish care? No .  Dental Screening: Recommended annual dental exams for proper oral hygiene  Community Resource Referral / Chronic Care Management: CRR required this visit?  No   CCM required this visit?  No      Plan:     I have personally reviewed and noted the following in the patient's chart:   . Medical and social history . Use of alcohol, tobacco or illicit drugs  . Current medications and supplements . Functional ability and status . Nutritional status . Physical  activity . Advanced directives . List of other physicians . Hospitalizations, surgeries, and ER visits in previous 12 months . Vitals . Screenings to include cognitive, depression, and falls . Referrals and appointments  In addition, I have reviewed and discussed with patient certain preventive protocols, quality metrics, and best practice recommendations. A written personalized care plan for preventive services as well as general preventive health recommendations were provided to patient.     Hannah Kirby [place], [place]   [id number]   Nurse Notes: Pt needs a diabetic foot exam at next in office apt. Eye exam is scheduled for 02/2020. Pt declined a DEXA scan or mammogram order at this time.

## 2020-02-18 ENCOUNTER — Other Ambulatory Visit: Payer: Self-pay

## 2020-02-18 ENCOUNTER — Ambulatory Visit (INDEPENDENT_AMBULATORY_CARE_PROVIDER_SITE_OTHER): Payer: Medicare Other

## 2020-02-18 DIAGNOSIS — Z0000 Encounter for general adult medical examination without abnormal findings: Secondary | ICD-10-CM | POA: Diagnosis not present

## 2020-02-18 NOTE — Patient Instructions (Signed)
Hannah Kirby , Thank you for taking time to come for your Medicare Wellness Visit. I appreciate your ongoing commitment to your health goals. Please review the following plan we discussed and let me know if I can assist you in the future.   Screening recommendations/referrals: Colonoscopy: Up to date, due 10/2024 Mammogram: Currently due, declined order at this time.  Bone Density: Currently due, declined order at this time. Recommended yearly ophthalmology/optometry visit for glaucoma screening and checkup Recommended yearly dental visit for hygiene and checkup  Vaccinations: Influenza vaccine: Done 11/20/19 Pneumococcal vaccine: Completed series Tdap vaccine: Currently due, declined at this time. Shingles vaccine: Shingrix discussed. Please contact your pharmacy for coverage information.     Advanced directives: Please bring a copy of your POA (Power of Attorney) and/or Living Will to your next appointment.   Conditions/risks identified: Fall risk preventatives and smoking cessation discussed today. Recommend to continue to increase water intake to 6-8 8 oz glasses every day.   Next appointment: 02/24/21 @ 1:20 PM for an AWV call. Declined scheduling a follow up with PCP at this time.    Preventive Care 73 Years and Older, Female Preventive care refers to lifestyle choices and visits with your health care provider that can promote health and wellness. What does preventive care include?  A yearly physical exam. This is also called an annual well check.  Dental exams once or twice a year.  Routine eye exams. Ask your health care provider how often you should have your eyes checked.  Personal lifestyle choices, including:  Daily care of your teeth and gums.  Regular physical activity.  Eating a healthy diet.  Avoiding tobacco and drug use.  Limiting alcohol use.  Practicing safe sex.  Taking low-dose aspirin every day.  Taking vitamin and mineral supplements as recommended  by your health care provider. What happens during an annual well check? The services and screenings done by your health care provider during your annual well check will depend on your age, overall health, lifestyle risk factors, and family history of disease. Counseling  Your health care provider may ask you questions about your:  Alcohol use.  Tobacco use.  Drug use.  Emotional well-being.  Home and relationship well-being.  Sexual activity.  Eating habits.  History of falls.  Memory and ability to understand (cognition).  Work and work Astronomer.  Reproductive health. Screening  You may have the following tests or measurements:  Height, weight, and BMI.  Blood pressure.  Lipid and cholesterol levels. These may be checked every 5 years, or more frequently if you are over 61 years old.  Skin check.  Lung cancer screening. You may have this screening every year starting at age 73 if you have a 30-pack-year history of smoking and currently smoke or have quit within the past 15 years.  Fecal occult blood test (FOBT) of the stool. You may have this test every year starting at age 73.  Flexible sigmoidoscopy or colonoscopy. You may have a sigmoidoscopy every 5 years or a colonoscopy every 10 years starting at age 18.  Hepatitis C blood test.  Hepatitis B blood test.  Sexually transmitted disease (STD) testing.  Diabetes screening. This is done by checking your blood sugar (glucose) after you have not eaten for a while (fasting). You may have this done every 1-3 years.  Bone density scan. This is done to screen for osteoporosis. You may have this done starting at age 73.  Mammogram. This may be done every 1-2  years. Talk to your health care provider about how often you should have regular mammograms. Talk with your health care provider about your test results, treatment options, and if necessary, the need for more tests. Vaccines  Your health care provider may  recommend certain vaccines, such as:  Influenza vaccine. This is recommended every year.  Tetanus, diphtheria, and acellular pertussis (Tdap, Td) vaccine. You may need a Td booster every 10 years.  Zoster vaccine. You may need this after age 73.  Pneumococcal 13-valent conjugate (PCV13) vaccine. One dose is recommended after age 73.  Pneumococcal polysaccharide (PPSV23) vaccine. One dose is recommended after age 73. Talk to your health care provider about which screenings and vaccines you need and how often you need them. This information is not intended to replace advice given to you by your health care provider. Make sure you discuss any questions you have with your health care provider. Document Released: 03/06/2015 Document Revised: 10/28/2015 Document Reviewed: 12/09/2014 Elsevier Interactive Patient Education  2017 [place] Prevention in the Home Falls can cause injuries. They can happen to people of all ages. There are many things you can do to make your home safe and to help prevent falls. What can I do on the outside of my home?  Regularly fix the edges of walkways and driveways and fix any cracks.  Remove anything that might make you trip as you walk through a door, such as a raised step or threshold.  Trim any bushes or trees on the path to your home.  Use bright outdoor lighting.  Clear any walking paths of anything that might make someone trip, such as rocks or tools.  Regularly check to see if handrails are loose or broken. Make sure that both sides of any steps have handrails.  Any raised decks and porches should have guardrails on the edges.  Have any leaves, snow, or ice cleared regularly.  Use sand or salt on walking paths during winter.  Clean up any spills in your garage right away. This includes oil or grease spills. What can I do in the bathroom?  Use night lights.  Install grab bars by the toilet and in the tub and shower. Do not use towel  bars as grab bars.  Use non-skid mats or decals in the tub or shower.  If you need to sit down in the shower, use a plastic, non-slip stool.  Keep the floor dry. Clean up any water that spills on the floor as soon as it happens.  Remove soap buildup in the tub or shower regularly.  Attach bath mats securely with double-sided non-slip rug tape.  Do not have throw rugs and other things on the floor that can make you trip. What can I do in the bedroom?  Use night lights.  Make sure that you have a light by your bed that is easy to reach.  Do not use any sheets or blankets that are too big for your bed. They should not hang down onto the floor.  Have a firm chair that has side arms. You can use this for support while you get dressed.  Do not have throw rugs and other things on the floor that can make you trip. What can I do in the kitchen?  Clean up any spills right away.  Avoid walking on wet floors.  Keep items that you use a lot in easy-to-reach places.  If you need to reach something above you, use a strong step  stool that has a grab bar.  Keep electrical cords out of the way.  Do not use floor polish or wax that makes floors slippery. If you must use wax, use non-skid floor wax.  Do not have throw rugs and other things on the floor that can make you trip. What can I do with my stairs?  Do not leave any items on the stairs.  Make sure that there are handrails on both sides of the stairs and use them. Fix handrails that are broken or loose. Make sure that handrails are as long as the stairways.  Check any carpeting to make sure that it is firmly attached to the stairs. Fix any carpet that is loose or worn.  Avoid having throw rugs at the top or bottom of the stairs. If you do have throw rugs, attach them to the floor with carpet tape.  Make sure that you have a light switch at the top of the stairs and the bottom of the stairs. If you do not have them, ask someone to  add them for you. What else can I do to help prevent falls?  Wear shoes that:  Do not have high heels.  Have rubber bottoms.  Are comfortable and fit you well.  Are closed at the toe. Do not wear sandals.  If you use a stepladder:  Make sure that it is fully opened. Do not climb a closed stepladder.  Make sure that both sides of the stepladder are locked into place.  Ask someone to hold it for you, if possible.  Clearly mark and make sure that you can see:  Any grab bars or handrails.  First and last steps.  Where the edge of each step is.  Use tools that help you move around (mobility aids) if they are needed. These include:  Canes.  Walkers.  Scooters.  Crutches.  Turn on the lights when you go into a dark area. Replace any light bulbs as soon as they burn out.  Set up your furniture so you have a clear path. Avoid moving your furniture around.  If any of your floors are uneven, fix them.  If there are any pets around you, be aware of where they are.  Review your medicines with your doctor. Some medicines can make you feel dizzy. This can increase your chance of falling. Ask your doctor what other things that you can do to help prevent falls. This information is not intended to replace advice given to you by your health care provider. Make sure you discuss any questions you have with your health care provider. Document Released: 12/04/2008 Document Revised: 07/16/2015 Document Reviewed: 03/14/2014 Elsevier Interactive Patient Education  2017 [employer].

## 2020-02-25 ENCOUNTER — Encounter: Payer: Self-pay | Admitting: Family Medicine

## 2020-02-25 DIAGNOSIS — S46011A Strain of muscle(s) and tendon(s) of the rotator cuff of right shoulder, initial encounter: Secondary | ICD-10-CM | POA: Diagnosis not present

## 2020-02-25 DIAGNOSIS — M48061 Spinal stenosis, lumbar region without neurogenic claudication: Secondary | ICD-10-CM

## 2020-02-25 DIAGNOSIS — F1190 Opioid use, unspecified, uncomplicated: Secondary | ICD-10-CM

## 2020-02-25 MED ORDER — HYDROCODONE-ACETAMINOPHEN 5-325 MG PO TABS: 1.0000 | ORAL_TABLET | Freq: Four times a day (QID) | ORAL | 0 refills | Status: DC | PRN

## 2020-02-26 DIAGNOSIS — I152 Hypertension secondary to endocrine disorders: Secondary | ICD-10-CM | POA: Diagnosis not present

## 2020-02-26 DIAGNOSIS — R270 Ataxia, unspecified: Secondary | ICD-10-CM | POA: Diagnosis not present

## 2020-02-26 DIAGNOSIS — R5381 Other malaise: Secondary | ICD-10-CM | POA: Diagnosis not present

## 2020-02-26 DIAGNOSIS — R519 Headache, unspecified: Secondary | ICD-10-CM | POA: Diagnosis not present

## 2020-02-26 DIAGNOSIS — R262 Difficulty in walking, not elsewhere classified: Secondary | ICD-10-CM | POA: Diagnosis not present

## 2020-02-26 DIAGNOSIS — I639 Cerebral infarction, unspecified: Secondary | ICD-10-CM | POA: Diagnosis not present

## 2020-02-26 DIAGNOSIS — E1159 Type 2 diabetes mellitus with other circulatory complications: Secondary | ICD-10-CM | POA: Diagnosis not present

## 2020-02-27 ENCOUNTER — Other Ambulatory Visit: Payer: Self-pay

## 2020-02-27 ENCOUNTER — Ambulatory Visit
Admission: RE | Admit: 2020-02-27 | Discharge: 2020-02-27 | Disposition: A | Discharge status: Home / Self Care | Payer: Medicare Other | Source: Ambulatory Visit | Attending: Nurse Practitioner | Admitting: Nurse Practitioner

## 2020-02-27 DIAGNOSIS — G8929 Other chronic pain: Secondary | ICD-10-CM | POA: Diagnosis not present

## 2020-02-27 DIAGNOSIS — M5416 Radiculopathy, lumbar region: Secondary | ICD-10-CM | POA: Diagnosis not present

## 2020-02-27 DIAGNOSIS — M5441 Lumbago with sciatica, right side: Secondary | ICD-10-CM | POA: Diagnosis not present

## 2020-02-27 DIAGNOSIS — M545 Low back pain, unspecified: Secondary | ICD-10-CM | POA: Diagnosis not present

## 2020-02-28 DIAGNOSIS — R5381 Other malaise: Secondary | ICD-10-CM | POA: Insufficient documentation

## 2020-02-28 DIAGNOSIS — R270 Ataxia, unspecified: Secondary | ICD-10-CM | POA: Insufficient documentation

## 2020-02-28 DIAGNOSIS — R262 Difficulty in walking, not elsewhere classified: Secondary | ICD-10-CM | POA: Insufficient documentation

## 2020-03-02 DIAGNOSIS — H3322 Serous retinal detachment, left eye: Secondary | ICD-10-CM | POA: Diagnosis not present

## 2020-03-02 LAB — HM DIABETES EYE EXAM

## 2020-03-03 ENCOUNTER — Telehealth: Payer: Self-pay

## 2020-03-03 NOTE — Telephone Encounter (Signed)
Dr. [name] advised pt she can try decreasing her metoprolol to 50 mg twice a day instead of taking 100 mg twice a day to see if that improves her "energy levels". Pt instructed to closely monitor her BP.

## 2020-03-04 DIAGNOSIS — H3322 Serous retinal detachment, left eye: Secondary | ICD-10-CM

## 2020-03-04 HISTORY — DX: Serous retinal detachment, left eye: H33.22

## 2020-03-05 DIAGNOSIS — M25519 Pain in unspecified shoulder: Secondary | ICD-10-CM | POA: Insufficient documentation

## 2020-03-10 DIAGNOSIS — M75101 Unspecified rotator cuff tear or rupture of right shoulder, not specified as traumatic: Secondary | ICD-10-CM | POA: Diagnosis not present

## 2020-03-11 ENCOUNTER — Encounter: Payer: Self-pay | Admitting: Family Medicine

## 2020-03-13 DIAGNOSIS — N189 Chronic kidney disease, unspecified: Secondary | ICD-10-CM | POA: Diagnosis not present

## 2020-03-13 DIAGNOSIS — R2689 Other abnormalities of gait and mobility: Secondary | ICD-10-CM | POA: Diagnosis not present

## 2020-03-13 DIAGNOSIS — W109XXD Fall (on) (from) unspecified stairs and steps, subsequent encounter: Secondary | ICD-10-CM | POA: Diagnosis not present

## 2020-03-13 DIAGNOSIS — E1122 Type 2 diabetes mellitus with diabetic chronic kidney disease: Secondary | ICD-10-CM | POA: Diagnosis not present

## 2020-03-13 DIAGNOSIS — Z794 Long term (current) use of insulin: Secondary | ICD-10-CM | POA: Diagnosis not present

## 2020-03-13 DIAGNOSIS — J449 Chronic obstructive pulmonary disease, unspecified: Secondary | ICD-10-CM | POA: Diagnosis not present

## 2020-03-13 DIAGNOSIS — M75111 Incomplete rotator cuff tear or rupture of right shoulder, not specified as traumatic: Secondary | ICD-10-CM | POA: Diagnosis not present

## 2020-03-13 DIAGNOSIS — I129 Hypertensive chronic kidney disease with stage 1 through stage 4 chronic kidney disease, or unspecified chronic kidney disease: Secondary | ICD-10-CM | POA: Diagnosis not present

## 2020-03-13 DIAGNOSIS — I2510 Atherosclerotic heart disease of native coronary artery without angina pectoris: Secondary | ICD-10-CM | POA: Diagnosis not present

## 2020-03-13 DIAGNOSIS — Z87891 Personal history of nicotine dependence: Secondary | ICD-10-CM | POA: Diagnosis not present

## 2020-03-17 DIAGNOSIS — G8929 Other chronic pain: Secondary | ICD-10-CM | POA: Diagnosis not present

## 2020-03-17 DIAGNOSIS — M5441 Lumbago with sciatica, right side: Secondary | ICD-10-CM | POA: Diagnosis not present

## 2020-03-17 DIAGNOSIS — M48061 Spinal stenosis, lumbar region without neurogenic claudication: Secondary | ICD-10-CM | POA: Diagnosis not present

## 2020-03-18 DIAGNOSIS — N189 Chronic kidney disease, unspecified: Secondary | ICD-10-CM | POA: Diagnosis not present

## 2020-03-18 DIAGNOSIS — E1122 Type 2 diabetes mellitus with diabetic chronic kidney disease: Secondary | ICD-10-CM | POA: Diagnosis not present

## 2020-03-18 DIAGNOSIS — I129 Hypertensive chronic kidney disease with stage 1 through stage 4 chronic kidney disease, or unspecified chronic kidney disease: Secondary | ICD-10-CM | POA: Diagnosis not present

## 2020-03-18 DIAGNOSIS — W109XXD Fall (on) (from) unspecified stairs and steps, subsequent encounter: Secondary | ICD-10-CM | POA: Diagnosis not present

## 2020-03-18 DIAGNOSIS — M75111 Incomplete rotator cuff tear or rupture of right shoulder, not specified as traumatic: Secondary | ICD-10-CM | POA: Diagnosis not present

## 2020-03-18 DIAGNOSIS — R2689 Other abnormalities of gait and mobility: Secondary | ICD-10-CM | POA: Diagnosis not present

## 2020-03-20 DIAGNOSIS — R2689 Other abnormalities of gait and mobility: Secondary | ICD-10-CM | POA: Diagnosis not present

## 2020-03-20 DIAGNOSIS — N189 Chronic kidney disease, unspecified: Secondary | ICD-10-CM | POA: Diagnosis not present

## 2020-03-20 DIAGNOSIS — M75110 Incomplete rotator cuff tear or rupture of unspecified shoulder, not specified as traumatic: Secondary | ICD-10-CM | POA: Insufficient documentation

## 2020-03-20 DIAGNOSIS — E1122 Type 2 diabetes mellitus with diabetic chronic kidney disease: Secondary | ICD-10-CM | POA: Diagnosis not present

## 2020-03-20 DIAGNOSIS — M75111 Incomplete rotator cuff tear or rupture of right shoulder, not specified as traumatic: Secondary | ICD-10-CM | POA: Diagnosis not present

## 2020-03-20 DIAGNOSIS — I129 Hypertensive chronic kidney disease with stage 1 through stage 4 chronic kidney disease, or unspecified chronic kidney disease: Secondary | ICD-10-CM | POA: Diagnosis not present

## 2020-03-20 DIAGNOSIS — W109XXD Fall (on) (from) unspecified stairs and steps, subsequent encounter: Secondary | ICD-10-CM | POA: Diagnosis not present

## 2020-03-23 ENCOUNTER — Telehealth: Payer: Self-pay

## 2020-03-23 ENCOUNTER — Inpatient Hospital Stay
Admission: EM | Admit: 2020-03-23 | Discharge: 2020-03-25 | DRG: 247 | Disposition: A | Discharge status: Home / Self Care | Payer: Medicare Other | Attending: Internal Medicine | Admitting: Internal Medicine

## 2020-03-23 ENCOUNTER — Encounter: Payer: Self-pay | Admitting: Cardiovascular Disease

## 2020-03-23 ENCOUNTER — Inpatient Hospital Stay: Payer: Medicare Other

## 2020-03-23 ENCOUNTER — Telehealth: Payer: Self-pay | Admitting: Cardiovascular Disease

## 2020-03-23 ENCOUNTER — Other Ambulatory Visit
Admission: RE | Admit: 2020-03-23 | Discharge: 2020-03-23 | Disposition: A | Discharge status: Home / Self Care | Payer: Medicare Other | Source: Ambulatory Visit | Attending: Cardiovascular Disease | Admitting: Cardiovascular Disease

## 2020-03-23 ENCOUNTER — Emergency Department: Payer: Medicare Other

## 2020-03-23 ENCOUNTER — Other Ambulatory Visit: Payer: Self-pay

## 2020-03-23 ENCOUNTER — Ambulatory Visit (INDEPENDENT_AMBULATORY_CARE_PROVIDER_SITE_OTHER): Payer: Medicare Other | Admitting: Cardiovascular Disease

## 2020-03-23 VITALS — BP 120/72 | HR 66 | Ht 65.0 in | Wt 168.0 lb

## 2020-03-23 DIAGNOSIS — I2510 Atherosclerotic heart disease of native coronary artery without angina pectoris: Secondary | ICD-10-CM | POA: Diagnosis not present

## 2020-03-23 DIAGNOSIS — Z955 Presence of coronary angioplasty implant and graft: Secondary | ICD-10-CM

## 2020-03-23 DIAGNOSIS — E0821 Diabetes mellitus due to underlying condition with diabetic nephropathy: Secondary | ICD-10-CM

## 2020-03-23 DIAGNOSIS — Z8673 Personal history of transient ischemic attack (TIA), and cerebral infarction without residual deficits: Secondary | ICD-10-CM

## 2020-03-23 DIAGNOSIS — F17210 Nicotine dependence, cigarettes, uncomplicated: Secondary | ICD-10-CM | POA: Diagnosis present

## 2020-03-23 DIAGNOSIS — I129 Hypertensive chronic kidney disease with stage 1 through stage 4 chronic kidney disease, or unspecified chronic kidney disease: Secondary | ICD-10-CM | POA: Diagnosis present

## 2020-03-23 DIAGNOSIS — F419 Anxiety disorder, unspecified: Secondary | ICD-10-CM | POA: Diagnosis present

## 2020-03-23 DIAGNOSIS — Z8249 Family history of ischemic heart disease and other diseases of the circulatory system: Secondary | ICD-10-CM

## 2020-03-23 DIAGNOSIS — Z20822 Contact with and (suspected) exposure to covid-19: Secondary | ICD-10-CM | POA: Diagnosis present

## 2020-03-23 DIAGNOSIS — Z833 Family history of diabetes mellitus: Secondary | ICD-10-CM | POA: Diagnosis not present

## 2020-03-23 DIAGNOSIS — E1121 Type 2 diabetes mellitus with diabetic nephropathy: Secondary | ICD-10-CM | POA: Diagnosis present

## 2020-03-23 DIAGNOSIS — F17200 Nicotine dependence, unspecified, uncomplicated: Secondary | ICD-10-CM

## 2020-03-23 DIAGNOSIS — Z888 Allergy status to other drugs, medicaments and biological substances status: Secondary | ICD-10-CM | POA: Diagnosis not present

## 2020-03-23 DIAGNOSIS — I5032 Chronic diastolic (congestive) heart failure: Secondary | ICD-10-CM

## 2020-03-23 DIAGNOSIS — E1169 Type 2 diabetes mellitus with other specified complication: Secondary | ICD-10-CM | POA: Diagnosis not present

## 2020-03-23 DIAGNOSIS — Z7982 Long term (current) use of aspirin: Secondary | ICD-10-CM | POA: Diagnosis not present

## 2020-03-23 DIAGNOSIS — I10 Essential (primary) hypertension: Secondary | ICD-10-CM | POA: Diagnosis present

## 2020-03-23 DIAGNOSIS — Z7951 Long term (current) use of inhaled steroids: Secondary | ICD-10-CM | POA: Diagnosis not present

## 2020-03-23 DIAGNOSIS — Z7902 Long term (current) use of antithrombotics/antiplatelets: Secondary | ICD-10-CM

## 2020-03-23 DIAGNOSIS — K219 Gastro-esophageal reflux disease without esophagitis: Secondary | ICD-10-CM | POA: Diagnosis present

## 2020-03-23 DIAGNOSIS — J449 Chronic obstructive pulmonary disease, unspecified: Secondary | ICD-10-CM | POA: Diagnosis present

## 2020-03-23 DIAGNOSIS — E1122 Type 2 diabetes mellitus with diabetic chronic kidney disease: Secondary | ICD-10-CM | POA: Diagnosis present

## 2020-03-23 DIAGNOSIS — I25119 Atherosclerotic heart disease of native coronary artery with unspecified angina pectoris: Secondary | ICD-10-CM | POA: Diagnosis present

## 2020-03-23 DIAGNOSIS — Z885 Allergy status to narcotic agent status: Secondary | ICD-10-CM

## 2020-03-23 DIAGNOSIS — Z882 Allergy status to sulfonamides status: Secondary | ICD-10-CM

## 2020-03-23 DIAGNOSIS — R079 Chest pain, unspecified: Secondary | ICD-10-CM | POA: Diagnosis not present

## 2020-03-23 DIAGNOSIS — Z794 Long term (current) use of insulin: Secondary | ICD-10-CM

## 2020-03-23 DIAGNOSIS — E785 Hyperlipidemia, unspecified: Secondary | ICD-10-CM | POA: Diagnosis present

## 2020-03-23 DIAGNOSIS — Z79899 Other long term (current) drug therapy: Secondary | ICD-10-CM | POA: Diagnosis not present

## 2020-03-23 DIAGNOSIS — I214 Non-ST elevation (NSTEMI) myocardial infarction: Principal | ICD-10-CM | POA: Diagnosis present

## 2020-03-23 DIAGNOSIS — I25118 Atherosclerotic heart disease of native coronary artery with other forms of angina pectoris: Secondary | ICD-10-CM | POA: Diagnosis not present

## 2020-03-23 DIAGNOSIS — N1832 Chronic kidney disease, stage 3b: Secondary | ICD-10-CM | POA: Diagnosis present

## 2020-03-23 HISTORY — DX: Panic disorder (episodic paroxysmal anxiety): F41.0

## 2020-03-23 HISTORY — DX: Tobacco use: Z72.0

## 2020-03-23 HISTORY — DX: Ventricular premature depolarization: I49.3

## 2020-03-23 HISTORY — DX: Personal history of other medical treatment: Z92.89

## 2020-03-23 HISTORY — DX: Anxiety disorder, unspecified: F41.9

## 2020-03-23 HISTORY — DX: Non-ST elevation (NSTEMI) myocardial infarction: I21.4

## 2020-03-23 LAB — HEPATIC FUNCTION PANEL
ALT: 19 U/L (ref 0–44)
AST: 34 U/L (ref 15–41)
Albumin: 3.8 g/dL (ref 3.5–5.0)
Alkaline Phosphatase: 86 U/L (ref 38–126)
Bilirubin, Direct: <0.1 mg/dL (ref 0.0–0.2)
Total Bilirubin: 0.6 mg/dL (ref 0.3–1.2)
Total Protein: 7.3 g/dL (ref 6.5–8.1)

## 2020-03-23 LAB — CBG MONITORING, ED: Glucose-Capillary: 119 mg/dL — ABNORMAL HIGH (ref 70–99)

## 2020-03-23 LAB — CBC
HCT: 50.5 % — ABNORMAL HIGH (ref 36.0–46.0)
Hemoglobin: 17 g/dL — ABNORMAL HIGH (ref 12.0–15.0)
MCH: 32.6 pg (ref 26.0–34.0)
MCHC: 33.7 g/dL (ref 30.0–36.0)
MCV: 96.7 fL (ref 80.0–100.0)
Platelets: 221 10*3/uL (ref 150–400)
RBC: 5.22 MIL/uL — ABNORMAL HIGH (ref 3.87–5.11)
RDW: 13.3 % (ref 11.5–15.5)
WBC: 10.1 10*3/uL (ref 4.0–10.5)
nRBC: 0 % (ref 0.0–0.2)

## 2020-03-23 LAB — BASIC METABOLIC PANEL
Anion gap: 13 (ref 5–15)
BUN: 18 mg/dL (ref 8–23)
CO2: 26 mmol/L (ref 22–32)
Calcium: 9.9 mg/dL (ref 8.9–10.3)
Chloride: 102 mmol/L (ref 98–111)
Creatinine, Ser: 1.45 mg/dL — ABNORMAL HIGH (ref 0.44–1.00)
GFR, Estimated: 38 mL/min — ABNORMAL LOW (ref >60)
Glucose, Bld: 197 mg/dL — ABNORMAL HIGH (ref 70–99)
Potassium: 3.8 mmol/L (ref 3.5–5.1)
Sodium: 141 mmol/L (ref 135–145)

## 2020-03-23 LAB — APTT: aPTT: 31 seconds (ref 24–36)

## 2020-03-23 LAB — TROPONIN I (HIGH SENSITIVITY)
Troponin I (High Sensitivity): 1000 ng/L (ref <18)
Troponin I (High Sensitivity): 970 ng/L (ref <18)
Troponin I (High Sensitivity): 1040 ng/L (ref <18)

## 2020-03-23 LAB — PROTIME-INR
INR: 1 (ref 0.8–1.2)
Prothrombin Time: 12.4 seconds (ref 11.4–15.2)

## 2020-03-23 MED ORDER — HEPARIN BOLUS VIA INFUSION
4000.0000 [IU] | Freq: Once | INTRAVENOUS | Status: AC
  Administered 2020-03-23: 4000 [IU] via INTRAVENOUS
  Filled 2020-03-23: qty 4000

## 2020-03-23 MED ORDER — ONDANSETRON HCL 4 MG/2ML IJ SOLN: 4.0000 mg | Freq: Four times a day (QID) | INTRAMUSCULAR | Status: DC | PRN

## 2020-03-23 MED ORDER — HEPARIN (PORCINE) 25000 UT/250ML-% IV SOLN: 10.0000 [IU]/kg/h | INTRAVENOUS | Status: DC

## 2020-03-23 MED ORDER — INSULIN GLARGINE 100 UNIT/ML ~~LOC~~ SOLN
10.0000 [IU] | Freq: Every day | SUBCUTANEOUS | Status: DC
  Administered 2020-03-23 – 2020-03-24 (×2): 10 [IU] via SUBCUTANEOUS
  Filled 2020-03-23 (×4): qty 0.1

## 2020-03-23 MED ORDER — INSULIN ASPART 100 UNIT/ML ~~LOC~~ SOLN: 0.0000 [IU] | Freq: Every day | SUBCUTANEOUS | Status: DC

## 2020-03-23 MED ORDER — ACETAMINOPHEN 325 MG PO TABS
650.0000 mg | ORAL_TABLET | ORAL | Status: DC | PRN
  Administered 2020-03-24: 650 mg via ORAL

## 2020-03-23 MED ORDER — NITROGLYCERIN 0.4 MG SL SUBL: 0.4000 mg | SUBLINGUAL_TABLET | SUBLINGUAL | 6 refills | Status: DC | PRN

## 2020-03-23 MED ORDER — NITROGLYCERIN 0.4 MG SL SUBL: 0.4000 mg | SUBLINGUAL_TABLET | SUBLINGUAL | Status: DC | PRN

## 2020-03-23 MED ORDER — ASPIRIN 81 MG PO CHEW
324.0000 mg | CHEWABLE_TABLET | Freq: Once | ORAL | Status: AC
  Administered 2020-03-23: 324 mg via ORAL
  Filled 2020-03-23: qty 4

## 2020-03-23 MED ORDER — HEPARIN SODIUM (PORCINE) 5000 UNIT/ML IJ SOLN: 4000.0000 [IU] | Freq: Once | INTRAMUSCULAR | Status: DC

## 2020-03-23 MED ORDER — HEPARIN (PORCINE) 25000 UT/250ML-% IV SOLN
900.0000 [IU]/h | INTRAVENOUS | Status: DC
  Administered 2020-03-23: 900 [IU]/h via INTRAVENOUS
  Filled 2020-03-23: qty 250

## 2020-03-23 MED ORDER — INSULIN ASPART 100 UNIT/ML ~~LOC~~ SOLN
0.0000 [IU] | Freq: Three times a day (TID) | SUBCUTANEOUS | Status: DC
  Administered 2020-03-24: 3 [IU] via SUBCUTANEOUS
  Filled 2020-03-23: qty 1

## 2020-03-23 NOTE — Telephone Encounter (Signed)
Spoke with the patient. Patient sts that she has intermittent episodes of chest pressure since Friday. The pressure is above her breast at the center of her chest. Pt sts that the discomfort radiate bilaterally to both of her arms an back. Discomfort responds to Nitro. Pt denies n/v, diaphoresis. She sts that during the episodes it is hard to catch her breath. The last episode occurred last night. She is currently asymptomatic.  She has been scheduled to see Dr. [name] today at [place] the patient to keep that appt as long as she is stable. If symptoms return or worsen she is to seek emergent care in the ED.  Patient verbalized understanding and voiced appreciation for the call.

## 2020-03-23 NOTE — Consult Note (Signed)
ANTICOAGULATION CONSULT NOTE - Initial Consult  Pharmacy Consult for Heparin Indication: chest pain/ACS  Allergies  Allergen Reactions  . Spironolactone Shortness Of Breath  . Sulfonamide Derivatives Other (See Comments)    Last taken as a child; made her mouth break out  . Other Other (See Comments)    Mouth blisters  . Sulfa Antibiotics Other (See Comments)    Mouth blisters Welts on mouth   . Codeine Nausea And Vomiting and Other (See Comments)    Nausea  . Prednisone Palpitations and Other (See Comments)    Made her legs "feel weird."    Patient Measurements: Height: 5\' 5"  (165.1 cm) Weight: 76.2 kg (168 lb) IBW/kg (Calculated) : 57 Heparin Dosing Weight: 72.6 kg   Vital Signs: Temp: 97.8 F (36.6 C) (01/31 1759) BP: 152/90 (01/31 1759) Pulse Rate: 73 (01/31 1759)  Labs: Recent Labs    03/23/20 1516 03/23/20 1800  HGB  --  17.0*  HCT  --  50.5*  PLT  --  221  TROPONINIHS 1,000*  --     CrCl cannot be calculated (Patient's most recent lab result is older than the maximum 21 days allowed.).   Medical History: Past Medical History:  Diagnosis Date  . Anemia   . Arthritis   . CAD (coronary artery disease)    a. cath 02/2006: BMS x 2 to RCA, cath o/w without significant coronary disease; b. nuclear stress test 07/2014: no signs of ischemia, no ekg changes concerning for ischemia, low risk study/normal study  . Chronic bronchitis ([place])    secondary to cigarette smoking  . Chronic kidney disease (CKD), stage III (moderate) ([facility])   . COPD (chronic obstructive pulmonary disease) ([place])   . Diabetes mellitus   . FHx: allergies   . GERD (gastroesophageal reflux disease)   . Goiter   . 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 Hernia   . Hyperlipidemia   . Hypertension   . Kidney stone on left side 2013  . Microalbuminuria   . Obesity   . Smokers' cough ([place])   . Spinal stenosis   . Stroke ([place]) 10/29/2016   mild left side weakness  . Stroke ([place]) 08/01/2019     Medications:  (Not in a hospital admission)  Scheduled:  . aspirin  324 mg Oral Once  . heparin  4,000 Units Intravenous Once   Infusions:  . heparin     PRN:  Anti-infectives (From admission, onward)   None      Assessment: Pharmacy consulted to start heparin for ACS. Pt has c/o of left side chest discomfort. APTT and INR are ordered and troponin is in process. No note of DOAC PTA.   Goal of Therapy:  Heparin level 0.3-0.7 units/ml Monitor platelets by anticoagulation protocol: Yes   Plan:  Give 4000 units bolus x 1 Start heparin infusion at 900 units/hr Check anti-Xa level in 8 hours and daily while on heparin Continue to monitor H&H and platelets  Oswald Hillock, PharmD, BCPS 03/23/2020,6:23 PM

## 2020-03-23 NOTE — Patient Instructions (Addendum)
  Medication Instructions:  Try the tramadol and muscle relaxer pill for pain in your chest If no relief, call the office We might need a stress test  If you need a refill on your cardiac medications before your next appointment, please call your pharmacy.    Lab work: troponin (cardiac marker)  Your labs (blood work) drawn today and your tests are completely normal, you will receive your results only by: Marland Kitchen MyChart Message (if you have MyChart) OR . A paper copy in the mail If you have any lab test that is abnormal or we need to change your treatment, we will call you to review the results.   Testing/Procedures: No new testing needed   Follow-Up: At [facility], you and your health needs are our priority.  As part of our continuing mission to provide you with exceptional heart care, we have created designated Provider Care Teams.  These Care Teams include your primary Cardiologist (physician) and Advanced Practice Providers (APPs -  Physician Assistants and Nurse Practitioners) who all work together to provide you with the care you need, when you need it.  . You will need a follow up appointment in 6 months  . Providers on your designated Care Team:   . Murray Hodgkins, NP . Christell Faith, PA-C . Marrianne Mood, PA-C  Any Other Special Instructions Will Be Listed Below (If Applicable).  COVID-19 Vaccine Information can be found at: [email or web address] For questions related to vaccine distribution or appointments, please email [email or web address] or call [phone].
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
## 2020-03-23 NOTE — ED Provider Notes (Signed)
[facility] Emergency Department Provider Note   ____________________________________________   Event Date/Time   First MD Initiated Contact with Patient 03/23/20 1823     (approximate)  I have reviewed the triage vital signs and the nursing notes.   HISTORY  Chief Complaint Chest Pain    HPI Hannah Kirby is a 75 y.o. female who had some left-sided chest pain.  She went to see Dr. [name] the cardiologist he did troponin which was elevated called her and told her to come to the emergency room.  She has a little bit of shortness of breath as well.  Of them started on Friday.  She has no fever.  She has no chest pain or shortness of breath now.  Chest pain lasted about an hour and a half to an hour.  Went away with nitro.         Past Medical History:  Diagnosis Date   Anemia    Arthritis    CAD (coronary artery disease)    a. cath 02/2006: BMS x 2 to RCA, cath o/w without significant coronary disease; b. 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 difficile colitis 08/09/2019   Acute hemorrhagic colitis 08/08/2019   Type II  diabetes mellitus with renal manifestations ([place]) 08/08/2019   Hypokalemia 08/08/2019   Colitis    TIA (transient ischemic attack) 08/01/2019   Benign hypertensive kidney disease with chronic kidney disease 04/25/2019   Type 2 diabetes mellitus with diabetic chronic kidney disease ([place]) 04/25/2019   Secondary hyperparathyroidism of renal origin ([place]) 10/24/2018   Chronic, continuous use of opioids 03/26/2018   History of adenomatous polyp of colon 01/22/2018   Ulceration of colon determined by endoscopy 01/22/2018   Diverticulosis of colon 01/22/2018   History of CVA (cerebrovascular accident) 11/08/2016   Weakness of left upper extremity 10/30/2016   AAA (abdominal aortic aneurysm) without rupture ([place]) 08/12/2016   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   Stage 3b chronic kidney disease ([place]) 07/27/2015   Chest pain at rest 05/06/2015   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[place]) 10/28/2014   Solitary pulmonary nodule on lung CT 08/20/2014   Allergic rhinitis 07/28/2014   CAFL (chronic airflow limitation) ([place]) 07/28/2014   Abnormal liver enzymes 07/28/2014   Acid reflux 07/28/2014   Obesity    Granuloma annulare    Back pain 11/12/2013   Scoliosis 10/30/2013   Lumbar radiculopathy 10/30/2013   Lumbar canal stenosis 10/30/2013   Calcium blood increased 10/22/2013   Proteinuria 10/22/2013   Tobacco use disorder 03/21/2011   Obese  03/21/2011   Edema 08/18/2010   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   Coronary atherosclerosis 03/25/2009   Essential hypertension 03/25/2009    Past Surgical History:  Procedure Laterality Date   ABDOMINAL HYSTERECTOMY     BREAST SURGERY     CATARACT EXTRACTION W/PHACO Right 03/01/2017   Procedure: CATARACT EXTRACTION PHACO AND INTRAOCULAR LENS PLACEMENT ([place]) RIGHT DIABETIC;  Surgeon: Leandrew Koyanagi, MD;  Location: [place];  Service: Ophthalmology;  Laterality: Right;    CATARACT EXTRACTION W/PHACO Left 03/22/2017   Procedure: CATARACT EXTRACTION PHACO AND INTRAOCULAR LENS PLACEMENT ([place]) LEFT DIABETIC;  Surgeon: Leandrew Koyanagi, MD;  Location: [place];  Service: Ophthalmology;  Laterality: Left;  Diabetic - insulin and oral meds   COLONOSCOPY WITH PROPOFOL N/A 09/23/2014   Procedure: COLONOSCOPY WITH PROPOFOL;  Surgeon: Lollie Sails, MD;  Location: [facility] ENDOSCOPY;  Service: Endoscopy;  Laterality: N/A;   COLONOSCOPY WITH PROPOFOL N/A 01/11/2018   Procedure: COLONOSCOPY WITH PROPOFOL;  Surgeon: Lollie Sails, MD;  Location: [facility] ENDOSCOPY;  Service: Endoscopy;  Laterality: N/A;   COLONOSCOPY WITH PROPOFOL N/A 04/24/2018   Procedure: COLONOSCOPY WITH PROPOFOL;  Surgeon: Lollie Sails, MD;  Location: [facility] ENDOSCOPY;  Service: Endoscopy;  Laterality: N/A;   COLONOSCOPY WITH PROPOFOL N/A 11/19/2019   Procedure: COLONOSCOPY WITH PROPOFOL;  Surgeon: Lucilla Lame, MD;  Location: [facility] ENDOSCOPY;  Service: Endoscopy;  Laterality: N/A;   CORONARY ANGIOPLASTY WITH STENT PLACEMENT  2008   ESOPHAGOGASTRODUODENOSCOPY (EGD) WITH PROPOFOL N/A 12/30/2014   Procedure: ESOPHAGOGASTRODUODENOSCOPY (EGD) WITH PROPOFOL;  Surgeon: Lollie Sails, MD;  Location: [facility] ENDOSCOPY;  Service: Endoscopy;  Laterality: N/A;   ESOPHAGOGASTRODUODENOSCOPY (EGD) WITH PROPOFOL N/A 07/19/2016   Procedure: ESOPHAGOGASTRODUODENOSCOPY (EGD) WITH PROPOFOL;  Surgeon: Lollie Sails, MD;  Location: [facility] ENDOSCOPY;  Service: Endoscopy;  Laterality: N/A;   ESOPHAGOGASTRODUODENOSCOPY (EGD) WITH PROPOFOL N/A 04/24/2018   Procedure: ESOPHAGOGASTRODUODENOSCOPY (EGD) WITH PROPOFOL;  Surgeon: Lollie Sails, MD;  Location: [facility] ENDOSCOPY;  Service: Endoscopy;  Laterality: N/A;   hysterectomy (other)      Prior to Admission medications   Medication Sig Start Date End Date Taking? Authorizing Provider  acetaminophen (TYLENOL) 500 MG tablet Take 500 mg by mouth every 6  (six) hours as needed.    [provider]  albuterol (VENTOLIN HFA) 108 (90 Base) MCG/ACT inhaler Inhale 2 puffs into the lungs every 4 (four) hours as needed for wheezing or shortness of breath. 09/21/17   Birdie Sons, MD  amLODipine (NORVASC) 5 MG tablet Take 2.5 mg by mouth daily.    [provider]  aspirin (ASPIR-81) 81 MG EC tablet Take 81 mg by mouth daily.    [provider]  clopidogrel (PLAVIX) 75 MG tablet Take 1 tablet (75 mg total) by mouth daily. 11/06/19   Minna Merritts, MD  cyanocobalamin 1000 MCG tablet Take 1,000 mcg by mouth daily.    [provider]  ezetimibe (ZETIA) 10 MG tablet Take 1 tablet (10 mg total) by mouth daily. 11/06/19   Minna Merritts, MD  fluticasone furoate-vilanterol (BREO ELLIPTA) 100-25 MCG/INH AEPB Inhale 1 puff into the lungs daily. 11/20/19   Birdie Sons, MD  HYDROcodone-acetaminophen (NORCO/VICODIN) 5-325 MG tablet Take 1 tablet by mouth every 6 (six) hours as needed. 02/25/20   Birdie Sons, MD  insulin glargine (LANTUS) 100 UNIT/ML injection Inject 22 Units into the skin daily.     [provider]  lisinopril (ZESTRIL) 20 MG tablet Take 1 tablet (20 mg total)  by mouth daily. 11/06/19   Minna Merritts, MD  metoprolol tartrate (LOPRESSOR) 100 MG tablet Take 1 tablet (100 mg total) by mouth 2 (two) times daily. 11/06/19   Minna Merritts, MD  Multiple Vitamins-Minerals (DAILY MULTI) TABS Take 1 tablet by mouth daily.    [provider]  nitroGLYCERIN (NITROSTAT) 0.4 MG SL tablet Place 1 tablet (0.4 mg total) under the tongue every 5 (five) minutes as needed. 03/23/20   Minna Merritts, MD  Omega-3 Fatty Acids (FISH OIL) 1000 MG CAPS Take 2 capsules by mouth in the morning and at bedtime.     [provider]  ondansetron (ZOFRAN ODT) 4 MG disintegrating tablet Take 1 tablet (4 mg total) by mouth every 8 (eight) hours as needed for nausea or vomiting. 08/06/16   Alfred Levins, [place],  MD  RABEprazole (ACIPHEX) 20 MG tablet Take 1 tablet (20 mg total) by mouth daily. 15 Mins. before evening meal 10/23/19   Lucilla Lame, MD  rosuvastatin (CRESTOR) 10 MG tablet Take 1 tablet (10 mg total) by mouth daily. 11/06/19   Minna Merritts, MD  Semaglutide,0.25 or 0.5MG /DOS, 2 MG/1.5ML SOPN Inject 0.5 mg into the skin once a week. 01/13/20   [provider]  sucralfate (CARAFATE) 1 g tablet Take 1 tablet (1 g total) by mouth 2 (two) times daily. 15 Minutes before evening meal and at bed time 10/23/19   Lucilla Lame, MD  tiotropium (SPIRIVA) 18 MCG inhalation capsule Place 1 capsule (18 mcg total) into inhaler and inhale daily. 11/20/19   Birdie Sons, MD    Allergies Spironolactone, Sulfonamide derivatives, Other, Sulfa antibiotics, Codeine, and Prednisone  Family History  Problem Relation Age of Onset   Heart failure Mother    Stroke Mother    Diabetes Mother    Congestive Heart Failure Mother    Lung cancer Father    Breast cancer Maternal Aunt     Social History Social History   Tobacco Use   Smoking status: Current Every Day Smoker    Packs/day: 0.75    Years: 40.00    Pack years: 30.00    Types: Cigarettes   Smokeless tobacco: Never Used  Scientific laboratory technician Use: Former  Substance Use Topics   Alcohol use: Yes    Comment: 0-1 glasses of wine a month   Drug use: No    Review of Systems  Constitutional: No fever/chills Eyes: No visual changes. ENT: No sore throat. Cardiovascular: Denies chest pain. Respiratory: Denies shortness of breath. Gastrointestinal: No abdominal pain.  No nausea, no vomiting.  No diarrhea.  No constipation. Genitourinary: Negative for dysuria. Musculoskeletal: Negative for back pain. Skin: Negative for rash. Neurological: Negative for headaches, focal weakness   ____________________________________________   PHYSICAL EXAM:  VITAL SIGNS: ED Triage Vitals  Enc Vitals Group     BP 03/23/20 1759 (!) 152/90      Pulse Rate 03/23/20 1759 73     Resp 03/23/20 1759 18     Temp 03/23/20 1759 97.8 F (36.6 C)     Temp src --      SpO2 03/23/20 1759 91 %     Weight 03/23/20 1757 168 lb (76.2 kg)     Height 03/23/20 1757 5\' 5"  (1.651 m)     Head Circumference --      Peak Flow --      Pain Score 03/23/20 1757 2     Pain Loc --      Pain  Edu? --      Excl. in [place]? --     Constitutional: Alert and oriented.  Looks tired Eyes: Conjunctivae are normal. PER. Head: Atraumatic. Nose: No congestion/rhinnorhea. Mouth/Throat: Mucous membranes are moist.  Oropharynx non-erythematous. Neck: No stridor.  Cardiovascular: Normal rate, regular rhythm. Grossly normal heart sounds.  Good peripheral circulation. Respiratory: Normal respiratory effort.  No retractions. Lungs occasional scattered crackles Gastrointestinal: Soft and nontender. No distention. No abdominal bruits.  Musculoskeletal: No lower extremity tenderness nor edema.   Neurologic:  Normal speech and language. No gross focal neurologic deficits are appreciated. Skin:  Skin is warm, dry and intact. No rash noted.   ____________________________________________   LABS (all labs ordered are listed, but only abnormal results are displayed)  Labs Reviewed  CBC - Abnormal; Notable for the following components:      Result Value   RBC 5.22 (*)    Hemoglobin 17.0 (*)    HCT 50.5 (*)    All other components within normal limits  SARS CORONAVIRUS 2 (TAT 6-24 HRS)  BASIC METABOLIC PANEL  APTT  PROTIME-INR  HEPATIC FUNCTION PANEL  TROPONIN I (HIGH SENSITIVITY)   ____________________________________________  EKG  EKG read interpreted by me shows normal sinus rhythm rate of 74 normal axis flipped T's in 1 and L which are old ____________________________________________  RADIOLOGY Gertha Calkin, personally viewed and evaluated these images (plain radiographs) as part of my medical decision making, as well as reviewing the written  report by the radiologist.  ED MD interpretation: Chest x-ray is pending  Official radiology report(s): No results found.  ____________________________________________   PROCEDURES  Procedure(s) performed (including Critical Care):  Procedures   ____________________________________________   INITIAL IMPRESSION / ASSESSMENT AND PLAN / ED COURSE  Patient with an hour to an hour and a half of chest pain now with markedly elevated troponin.  No sign of STEMI on EKG currently.  Which is likely an NSTEMI.  We will get her in the hospital and treat her with more aspirin and heparin and do further cardiac work-up.              ____________________________________________   FINAL CLINICAL IMPRESSION(S) / ED DIAGNOSES  Final diagnoses:  NSTEMI (non-ST elevated myocardial infarction) [facility])     ED Discharge Orders    None      *Please note:  Ceceilia Cephus Zarzycki was evaluated in Emergency Department on 03/23/2020 for the symptoms described in the history of present illness. She was evaluated in the context of the global COVID-19 pandemic, which necessitated consideration that the patient might be at risk for infection with the SARS-CoV-2 virus that causes COVID-19. Institutional protocols and algorithms that pertain to the evaluation of patients at risk for COVID-19 are in a state of rapid change based on information released by regulatory bodies including the CDC and federal and state organizations. These policies and algorithms were followed during the patient's care in the ED.  Some ED evaluations and interventions may be delayed as a result of limited staffing during and the pandemic.*   Note:  This document was prepared using Dragon voice recognition software and may include unintentional dictation errors.    Nena Polio, MD 03/23/20 [phone]

## 2020-03-23 NOTE — H&P (Signed)
History and Physical    Hannah Kirby [id number] DOB: 11/30/1945 DOA: 03/23/2020  PCP: Birdie Sons, MD  Patient coming from: Home  I have personally briefly reviewed patient's old medical records in [place]  Chief Complaint: Chest tightness  HPI: Hannah Kirby is a 75 y.o. female with medical history significant for CAD s/p stent to RCA 2008, hypertension, insulin-dependent type 2 diabetes, CKD 3A, CVA who presents with concerns of chest tightness and elevated troponin.  About 3 days ago patient started to notice that at night she would have anterior chest tightness and pressure.  This would only happen at night around dinnertime and when she is doing some chores.  No heavy lifting.  Does not feel it during the day or afternoon.  Took several doses of nitro over the weekend with relief.  She also notes some associated shortness of breath when she goes to sleep but thinks is because she is having panic attacks.  She denies any nausea or vomiting.  She continues to be a heavy smoker using 1 pack of tobacco per day.  She is compliant with her medications.  She presented to her cardiologist with the symptoms today and was found to have elevated troponin of the 1000 and EKG showing sinus rhythm with nonspecific ST changes.  ED Course: She was mildly hypertensive up to 152/90 and is currently chest pain-free.  Repeat troponin of 970.  CBC and BMP with no other significant changes  Review of Systems: Constitutional: No Weight Change, No Fever ENT/Mouth: No sore throat, No Rhinorrhea Eyes: No Eye Pain, No Vision Changes Cardiovascular: + Chest Pain, + SOB, No PND, No Dyspnea on Exertion, No Orthopnea, , No Edema, No Palpitations Respiratory: No Cough, No Sputum, No Wheezing, no Dyspnea  Gastrointestinal: No Nausea, No Vomiting, No Diarrhea, No Constipation, No Pain Genitourinary: no Urinary Incontinence Musculoskeletal: No Arthralgias, No Myalgias Skin: No Skin Lesions, No  Pruritus, Neuro: no Weakness, No Numbness Psych: No Anxiety/Panic, No Depression, no decrease appetite Heme/Lymph: No Bruising, No Bleeding Past Medical History:  Diagnosis Date  . Anemia   . Arthritis   . CAD (coronary artery disease)    a. cath 02/2006: BMS x 2 to RCA, cath o/w without significant coronary disease; b. nuclear stress test 07/2014: no signs of ischemia, no ekg changes concerning for ischemia, low risk study/normal study  . Chronic bronchitis ([place])    secondary to cigarette smoking  . Chronic kidney disease (CKD), stage III (moderate) (HCC)   . COPD (chronic obstructive pulmonary disease) ([place])   . Diabetes mellitus   . FHx: allergies   . GERD (gastroesophageal reflux disease)   . Goiter   . Granulomatous disease ([place])   . Hernia   . Hyperlipidemia   . Hypertension   . Kidney stone on left side 2013  . Microalbuminuria   . Obesity   . Smokers' cough ([place])   . Spinal stenosis   . Stroke ([place]) 10/29/2016   mild left side weakness  . Stroke [facility]) 08/01/2019    Past Surgical History:  Procedure Laterality Date  . ABDOMINAL HYSTERECTOMY    . BREAST SURGERY    . CATARACT EXTRACTION W/PHACO Right 03/01/2017   Procedure: CATARACT EXTRACTION PHACO AND INTRAOCULAR LENS PLACEMENT ([place]) RIGHT DIABETIC;  Surgeon: Leandrew Koyanagi, MD;  Location: [place];  Service: Ophthalmology;  Laterality: Right;  . CATARACT EXTRACTION W/PHACO Left 03/22/2017   Procedure: CATARACT EXTRACTION PHACO AND INTRAOCULAR LENS PLACEMENT ([facility]) LEFT DIABETIC;  Surgeon: Leandrew Koyanagi, MD;  Location: [place];  Service: Ophthalmology;  Laterality: Left;  Diabetic - insulin and oral meds  . COLONOSCOPY WITH PROPOFOL N/A 09/23/2014   Procedure: COLONOSCOPY WITH PROPOFOL;  Surgeon: Lollie Sails, MD;  Location: [facility] ENDOSCOPY;  Service: Endoscopy;  Laterality: N/A;  . COLONOSCOPY WITH PROPOFOL N/A 01/11/2018   Procedure: COLONOSCOPY WITH PROPOFOL;  Surgeon: Lollie Sails, MD;  Location: [facility] ENDOSCOPY;  Service: Endoscopy;  Laterality: N/A;  . COLONOSCOPY WITH PROPOFOL N/A 04/24/2018   Procedure: COLONOSCOPY WITH PROPOFOL;  Surgeon: Lollie Sails, MD;  Location: [facility] ENDOSCOPY;  Service: Endoscopy;  Laterality: N/A;  . COLONOSCOPY WITH PROPOFOL N/A 11/19/2019   Procedure: COLONOSCOPY WITH PROPOFOL;  Surgeon: Lucilla Lame, MD;  Location: [facility] ENDOSCOPY;  Service: Endoscopy;  Laterality: N/A;  . CORONARY ANGIOPLASTY WITH STENT PLACEMENT  2008  . ESOPHAGOGASTRODUODENOSCOPY (EGD) WITH PROPOFOL N/A 12/30/2014   Procedure: ESOPHAGOGASTRODUODENOSCOPY (EGD) WITH PROPOFOL;  Surgeon: Lollie Sails, MD;  Location: [facility] ENDOSCOPY;  Service: Endoscopy;  Laterality: N/A;  . ESOPHAGOGASTRODUODENOSCOPY (EGD) WITH PROPOFOL N/A 07/19/2016   Procedure: ESOPHAGOGASTRODUODENOSCOPY (EGD) WITH PROPOFOL;  Surgeon: Lollie Sails, MD;  Location: [facility] ENDOSCOPY;  Service: Endoscopy;  Laterality: N/A;  . ESOPHAGOGASTRODUODENOSCOPY (EGD) WITH PROPOFOL N/A 04/24/2018   Procedure: ESOPHAGOGASTRODUODENOSCOPY (EGD) WITH PROPOFOL;  Surgeon: Lollie Sails, MD;  Location: [facility] ENDOSCOPY;  Service: Endoscopy;  Laterality: N/A;  . hysterectomy (other)       reports that she has been smoking cigarettes. She has a 30.00 pack-year smoking history. She has never used smokeless tobacco. She reports current alcohol use. She reports that she does not use drugs. Social History  Allergies  Allergen Reactions  . Spironolactone Shortness Of Breath  . Sulfonamide Derivatives Other (See Comments)    Last taken as a child; made her mouth break out  . Other Other (See Comments)    Mouth blisters  . Sulfa Antibiotics Other (See Comments)    Mouth blisters Welts on mouth   . Codeine Nausea And Vomiting and Other (See Comments)    Nausea  . Prednisone Palpitations and Other (See Comments)    Made her legs "feel weird."    Family History  Problem Relation Age of Onset  . Heart  failure Mother   . Stroke Mother   . Diabetes Mother   . Congestive Heart Failure Mother   . Lung cancer Father   . Breast cancer Maternal Aunt      Prior to Admission medications   Medication Sig Start Date End Date Taking? Authorizing Provider  acetaminophen (TYLENOL) 500 MG tablet Take 500 mg by mouth every 6 (six) hours as needed.    [provider]  albuterol (VENTOLIN HFA) 108 (90 Base) MCG/ACT inhaler Inhale 2 puffs into the lungs every 4 (four) hours as needed for wheezing or shortness of breath. 09/21/17   Birdie Sons, MD  amLODipine (NORVASC) 5 MG tablet Take 2.5 mg by mouth daily.    [provider]  aspirin (ASPIR-81) 81 MG EC tablet Take 81 mg by mouth daily.    [provider]  clopidogrel (PLAVIX) 75 MG tablet Take 1 tablet (75 mg total) by mouth daily. 11/06/19   Minna Merritts, MD  cyanocobalamin 1000 MCG tablet Take 1,000 mcg by mouth daily.    [provider]  ezetimibe (ZETIA) 10 MG tablet Take 1 tablet (10 mg total) by mouth daily. 11/06/19   Minna Merritts, MD  fluticasone furoate-vilanterol (BREO ELLIPTA)  100-25 MCG/INH AEPB Inhale 1 puff into the lungs daily. 11/20/19   Birdie Sons, MD  HYDROcodone-acetaminophen (NORCO/VICODIN) 5-325 MG tablet Take 1 tablet by mouth every 6 (six) hours as needed. 02/25/20   Birdie Sons, MD  insulin glargine (LANTUS) 100 UNIT/ML injection Inject 22 Units into the skin daily.     [provider]  lisinopril (ZESTRIL) 20 MG tablet Take 1 tablet (20 mg total) by mouth daily. 11/06/19   Minna Merritts, MD  metoprolol tartrate (LOPRESSOR) 100 MG tablet Take 1 tablet (100 mg total) by mouth 2 (two) times daily. 11/06/19   Minna Merritts, MD  Multiple Vitamins-Minerals (DAILY MULTI) TABS Take 1 tablet by mouth daily.    [provider]  nitroGLYCERIN (NITROSTAT) 0.4 MG SL tablet Place 1 tablet (0.4 mg total) under the tongue every 5 (five) minutes as needed. 03/23/20    Minna Merritts, MD  Omega-3 Fatty Acids (FISH OIL) 1000 MG CAPS Take 2 capsules by mouth in the morning and at bedtime.     [provider]  ondansetron (ZOFRAN ODT) 4 MG disintegrating tablet Take 1 tablet (4 mg total) by mouth every 8 (eight) hours as needed for nausea or vomiting. 08/06/16   Alfred Levins, [place], MD  RABEprazole (ACIPHEX) 20 MG tablet Take 1 tablet (20 mg total) by mouth daily. 15 Mins. before evening meal 10/23/19   Lucilla Lame, MD  rosuvastatin (CRESTOR) 10 MG tablet Take 1 tablet (10 mg total) by mouth daily. 11/06/19   Minna Merritts, MD  Semaglutide,0.25 or 0.5MG /DOS, 2 MG/1.5ML SOPN Inject 0.5 mg into the skin once a week. 01/13/20   [provider]  sucralfate (CARAFATE) 1 g tablet Take 1 tablet (1 g total) by mouth 2 (two) times daily. 15 Minutes before evening meal and at bed time 10/23/19   Lucilla Lame, MD  tiotropium (SPIRIVA) 18 MCG inhalation capsule Place 1 capsule (18 mcg total) into inhaler and inhale daily. 11/20/19   Birdie Sons, MD    Physical Exam: Vitals:   03/23/20 1757 03/23/20 1759 03/23/20 1927  BP:  (!) 152/90   Pulse:  73 72  Resp:  18 16  Temp:  97.8 F (36.6 C)   SpO2:  91% 95%  Weight: 76.2 kg    Height: 5\' 5"  (1.651 m)      Constitutional: NAD, calm, comfortable, thin elderly female sitting upright at 45 degree angle in bed Vitals:   03/23/20 1757 03/23/20 1759 03/23/20 1927  BP:  (!) 152/90   Pulse:  73 72  Resp:  18 16  Temp:  97.8 F (36.6 C)   SpO2:  91% 95%  Weight: 76.2 kg    Height: 5\' 5"  (1.651 m)     Eyes: PERRL, lids and conjunctivae normal ENMT: Mucous membranes are moist.  Neck: normal, supple Respiratory: Diffuse coarse lung sounds throughout without any wheezing or crackles.  Normal respiratory effort. No accessory muscle use.  Cardiovascular: Regular rate and rhythm, no murmurs / rubs / gallops. No extremity edema.   Abdomen: no tenderness, no masses palpated.Bowel sounds positive.   Musculoskeletal: no clubbing / cyanosis. No joint deformity upper and lower extremities. Good ROM, no contractures. Normal muscle tone.  Skin: no rashes, lesions, ulcers. No induration Neurologic: CN 2-12 grossly intact. Sensation intact, Strength 5/5 in all 4.  Psychiatric: Normal judgment and insight. Alert and oriented x 3.  Tearful about her hospitalization.     Labs on Admission: I have personally  reviewed following labs and imaging studies  CBC: Recent Labs  Lab 03/23/20 1800  WBC 10.1  HGB 17.0*  HCT 50.5*  MCV 96.7  PLT 132   Basic Metabolic Panel: Recent Labs  Lab 03/23/20 1800  NA 141  K 3.8  CL 102  CO2 26  GLUCOSE 197*  BUN 18  CREATININE 1.45*  CALCIUM 9.9   GFR: Estimated Creatinine Clearance: 35.3 mL/min (A) (by C-G formula based on SCr of 1.45 mg/dL (H)). Liver Function Tests: Recent Labs  Lab 03/23/20 1800  AST 34  ALT 19  ALKPHOS 86  BILITOT 0.6  PROT 7.3  ALBUMIN 3.8   No results for input(s): LIPASE, AMYLASE in the last 168 hours. No results for input(s): AMMONIA in the last 168 hours. Coagulation Profile: Recent Labs  Lab 03/23/20 1800  INR 1.0   Cardiac Enzymes: No results for input(s): CKTOTAL, CKMB, CKMBINDEX, TROPONINI in the last 168 hours. BNP (last 3 results) No results for input(s): PROBNP in the last 8760 hours. HbA1C: No results for input(s): HGBA1C in the last 72 hours. CBG: No results for input(s): GLUCAP in the last 168 hours. Lipid Profile: No results for input(s): CHOL, HDL, LDLCALC, TRIG, CHOLHDL, LDLDIRECT in the last 72 hours. Thyroid Function Tests: No results for input(s): TSH, T4TOTAL, FREET4, T3FREE, THYROIDAB in the last 72 hours. Anemia Panel: No results for input(s): VITAMINB12, FOLATE, FERRITIN, TIBC, IRON, RETICCTPCT in the last 72 hours. Urine analysis:    Component Value Date/Time   COLORURINE YELLOW (A) 08/07/2019 2228   APPEARANCEUR HAZY (A) 08/07/2019 2228   LABSPEC 1.022 08/07/2019 2228    PHURINE 5.0 08/07/2019 2228   GLUCOSEU NEGATIVE 08/07/2019 2228   HGBUR SMALL (A) 08/07/2019 2228   BILIRUBINUR NEGATIVE 08/07/2019 2228   KETONESUR NEGATIVE 08/07/2019 2228   PROTEINUR >=300 (A) 08/07/2019 2228   NITRITE NEGATIVE 08/07/2019 2228   LEUKOCYTESUR NEGATIVE 08/07/2019 2228    Radiological Exams on Admission: DG Chest Port 1 View  Result Date: 03/23/2020 CLINICAL DATA:  Left-sided chest pain EXAM: PORTABLE CHEST 1 VIEW COMPARISON:  11/26/2018 FINDINGS: Cardiac shadow is stable. Aortic calcifications are seen. Lungs are clear bilaterally. No bony abnormality is seen. IMPRESSION: No acute abnormality noted. Electronically Signed   By: Inez Catalina M.D.   On: 03/23/2020 19:29      Assessment/Plan  NSTEMI w/hx of stent to RCA Troponin of 1000 -->970.  Continue to trend overnight. Follows with cardiology Dr. [name] at [place] IV heparin infusion Keep n.p.o. after midnight for likely heart catheterization.  Will need formal cardiology consult in the morning. Nitro SL PRN  Continuous cardiac tele   HTN Continue home regimen of amlodipine, metoprolol and lisinopril  Insulin-dependent type 2 diabetes Hemoglobin A1c of 8.2  back in Sept 2021 Takes 22 units of Lantus nightly at home start with 10U of Lantus and moderate SSI here  CKD stage IIIb Creatinine baseline of 1.3-1.6.  Currently stable. Avoid nephrotoxic agent.  History of CVA Continue aspirin and Plavix  Tobacco use Uses 1 pack/day.  Strongly encouraged cessation given her history of repeated MI and CVA  Social Patient was tearful during evaluation about her admission since husband is disable and is home alone without any help.  Will consult social work  DVT prophylaxis: IV heparin infusion  code Status: Full Family Communication: Plan discussed with patient at bedside  disposition Plan: Home with at least 2 midnight stays  Consults called:  Admission status: inpatient  Level of care:  Progressive Cardiac  Status is: Inpatient  Remains inpatient appropriate because:Inpatient level of care appropriate due to severity of illness   Dispo: The patient is from: Home              Anticipated d/c is to: Home              Anticipated d/c date is: 3 days              Patient currently is not medically stable to d/c.   Difficult to place patient No         Orene Desanctis DO [facility]   If 7PM-7AM, please contact night-coverage [email or web address]   03/23/2020, 7:46 PM

## 2020-03-23 NOTE — Telephone Encounter (Addendum)
Critical lab called from [facility] in [facility] lab  Critical Trop Time: 15:59 Results: Troponin 1,000 Physician notified: 16:00, Dr. [name], with verbla read back, instructed to call pt to notify of need to go to the ED for further work-up and evaluation.   Spoke to Mrs. Yono regarding her troponin results and Dr. Donivan Scull advice to seek the ED for further evaluation and plan of care regarding her evaluated trop. Educated pt that an elevated troponin is a cardiac marker to advise that high levels of troponin in the blood may mean you are having or recently had a heart attack, given her s/s over the weekend with CP. Pt verbalized understanding, will go to the ED via car with husband.   [facility] ED triage nurse [facility] notified of pt's arrival of approx 1 hr. Short summary of current s/s of intermittent CP since Friday, radiates down bilateral arms, and at times takes her breath away. Gave current trop results to [employer] as well. They are expecting pt to arrive via car to [place].

## 2020-03-23 NOTE — Telephone Encounter (Signed)
Pt c/o of Chest Pain: STAT if CP now or developed within 24 hours  1. Are you having CP right now?  No   2. Are you experiencing any other symptoms (ex. SOB, nausea, vomiting, sweating)?  Radiates to back chest and upper arms radiating pressure   3. How long have you been experiencing CP? Since Friday   4. Is your CP continuous or coming and going? Comes and goes   5. Have you taken Nitroglycerin? Yes nitro seemed to relieve the pain interim episodes    Patient requesting to be seen asap added to dod at 2 with gollan .  ?

## 2020-03-23 NOTE — ED Notes (Signed)
Pt given meal tray and water at this time 

## 2020-03-23 NOTE — ED Triage Notes (Addendum)
Pt comes via POV from home with c/o left sided chest discomfort. Pt states she had went to her PCP and they did lab work and later called her stating for her to come to ER.  Pt states some SOB. Pt states this all started Friday.  Pt states abnormal lab work was troponin. Result in chart shows result was 1000

## 2020-03-24 ENCOUNTER — Other Ambulatory Visit: Payer: Self-pay

## 2020-03-24 ENCOUNTER — Encounter
Admission: EM | Disposition: A | Discharge status: Home / Self Care | Payer: Self-pay | Source: Home / Self Care | Attending: Internal Medicine

## 2020-03-24 ENCOUNTER — Encounter: Payer: Self-pay | Admitting: Family Medicine

## 2020-03-24 DIAGNOSIS — E785 Hyperlipidemia, unspecified: Secondary | ICD-10-CM

## 2020-03-24 DIAGNOSIS — I2510 Atherosclerotic heart disease of native coronary artery without angina pectoris: Secondary | ICD-10-CM

## 2020-03-24 DIAGNOSIS — E1169 Type 2 diabetes mellitus with other specified complication: Secondary | ICD-10-CM

## 2020-03-24 DIAGNOSIS — F17210 Nicotine dependence, cigarettes, uncomplicated: Secondary | ICD-10-CM

## 2020-03-24 DIAGNOSIS — I214 Non-ST elevation (NSTEMI) myocardial infarction: Principal | ICD-10-CM

## 2020-03-24 HISTORY — PX: LEFT HEART CATH AND CORONARY ANGIOGRAPHY: CATH118249

## 2020-03-24 HISTORY — PX: CORONARY STENT INTERVENTION: CATH118234

## 2020-03-24 LAB — CBC
HCT: 46.6 % — ABNORMAL HIGH (ref 36.0–46.0)
Hemoglobin: 15.7 g/dL — ABNORMAL HIGH (ref 12.0–15.0)
MCH: 32.6 pg (ref 26.0–34.0)
MCHC: 33.7 g/dL (ref 30.0–36.0)
MCV: 96.7 fL (ref 80.0–100.0)
Platelets: 205 10*3/uL (ref 150–400)
RBC: 4.82 MIL/uL (ref 3.87–5.11)
RDW: 13.4 % (ref 11.5–15.5)
WBC: 9.8 10*3/uL (ref 4.0–10.5)
nRBC: 0 % (ref 0.0–0.2)

## 2020-03-24 LAB — SARS CORONAVIRUS 2 (TAT 6-24 HRS): SARS Coronavirus 2: NEGATIVE

## 2020-03-24 LAB — GLUCOSE, CAPILLARY
Glucose-Capillary: 135 mg/dL — ABNORMAL HIGH (ref 70–99)
Glucose-Capillary: 112 mg/dL — ABNORMAL HIGH (ref 70–99)
Glucose-Capillary: 169 mg/dL — ABNORMAL HIGH (ref 70–99)

## 2020-03-24 LAB — LIPID PANEL
Cholesterol: 147 mg/dL (ref 0–200)
HDL: 42 mg/dL (ref >40)
LDL Cholesterol: 70 mg/dL (ref 0–99)
Total CHOL/HDL Ratio: 3.5 RATIO
Triglycerides: 177 mg/dL — ABNORMAL HIGH (ref <150)
VLDL: 35 mg/dL (ref 0–40)

## 2020-03-24 LAB — TROPONIN I (HIGH SENSITIVITY)
Troponin I (High Sensitivity): 662 ng/L (ref <18)
Troponin I (High Sensitivity): 648 ng/L (ref <18)

## 2020-03-24 LAB — HEPARIN LEVEL (UNFRACTIONATED): Heparin Unfractionated: 0.38 IU/mL (ref 0.30–0.70)

## 2020-03-24 LAB — POCT ACTIVATED CLOTTING TIME: Activated Clotting Time: 517 seconds

## 2020-03-24 SURGERY — LEFT HEART CATH AND CORONARY ANGIOGRAPHY
Anesthesia: Choice

## 2020-03-24 MED ORDER — SODIUM CHLORIDE 0.9 % WEIGHT BASED INFUSION: 1.0000 mL/kg/h | INTRAVENOUS | Status: DC

## 2020-03-24 MED ORDER — SODIUM CHLORIDE 0.9 % IV SOLN: 250.0000 mL | INTRAVENOUS | Status: DC | PRN

## 2020-03-24 MED ORDER — ASPIRIN EC 81 MG PO TBEC
DELAYED_RELEASE_TABLET | ORAL | Status: AC
  Administered 2020-03-24: 81 mg via ORAL
  Filled 2020-03-24: qty 1

## 2020-03-24 MED ORDER — HEPARIN SODIUM (PORCINE) 1000 UNIT/ML IJ SOLN
INTRAMUSCULAR | Status: AC
  Filled 2020-03-24: qty 1

## 2020-03-24 MED ORDER — ACETAMINOPHEN 325 MG PO TABS
ORAL_TABLET | ORAL | Status: AC
  Filled 2020-03-24: qty 2

## 2020-03-24 MED ORDER — SODIUM CHLORIDE 0.9% FLUSH: 3.0000 mL | INTRAVENOUS | Status: DC | PRN

## 2020-03-24 MED ORDER — ALBUTEROL SULFATE (2.5 MG/3ML) 0.083% IN NEBU: 2.5000 mg | INHALATION_SOLUTION | RESPIRATORY_TRACT | Status: DC | PRN

## 2020-03-24 MED ORDER — VITAMIN B-12 1000 MCG PO TABS
1000.0000 ug | ORAL_TABLET | Freq: Every day | ORAL | Status: DC
  Administered 2020-03-25: 1000 ug via ORAL
  Filled 2020-03-24 (×2): qty 1

## 2020-03-24 MED ORDER — SODIUM CHLORIDE 0.9 % WEIGHT BASED INFUSION
3.0000 mL/kg/h | INTRAVENOUS | Status: DC
  Administered 2020-03-24: 3 mL/kg/h via INTRAVENOUS

## 2020-03-24 MED ORDER — FENTANYL CITRATE (PF) 100 MCG/2ML IJ SOLN
INTRAMUSCULAR | Status: AC
  Filled 2020-03-24: qty 2

## 2020-03-24 MED ORDER — CLOPIDOGREL BISULFATE 75 MG PO TABS: 75.0000 mg | ORAL_TABLET | Freq: Every day | ORAL | Status: DC

## 2020-03-24 MED ORDER — BIVALIRUDIN BOLUS VIA INFUSION - CUPID
INTRAVENOUS | Status: DC | PRN
  Administered 2020-03-24: 57.15 mg via INTRAVENOUS

## 2020-03-24 MED ORDER — MIDAZOLAM HCL 2 MG/2ML IJ SOLN
INTRAMUSCULAR | Status: AC
  Filled 2020-03-24: qty 2

## 2020-03-24 MED ORDER — METOPROLOL TARTRATE 50 MG PO TABS
ORAL_TABLET | ORAL | Status: AC
  Administered 2020-03-24: 100 mg via ORAL
  Filled 2020-03-24: qty 2

## 2020-03-24 MED ORDER — LABETALOL HCL 5 MG/ML IV SOLN: 10.0000 mg | INTRAVENOUS | Status: AC | PRN

## 2020-03-24 MED ORDER — VERAPAMIL HCL 2.5 MG/ML IV SOLN
INTRAVENOUS | Status: AC
  Filled 2020-03-24: qty 2

## 2020-03-24 MED ORDER — TIOTROPIUM BROMIDE MONOHYDRATE 18 MCG IN CAPS
18.0000 ug | ORAL_CAPSULE | Freq: Every day | RESPIRATORY_TRACT | Status: DC
  Administered 2020-03-25: 18 ug via RESPIRATORY_TRACT
  Filled 2020-03-24: qty 5

## 2020-03-24 MED ORDER — CLOPIDOGREL BISULFATE 75 MG PO TABS
ORAL_TABLET | ORAL | Status: AC
  Filled 2020-03-24: qty 4

## 2020-03-24 MED ORDER — CLOPIDOGREL BISULFATE 75 MG PO TABS
75.0000 mg | ORAL_TABLET | Freq: Every day | ORAL | Status: DC
  Administered 2020-03-25: 75 mg via ORAL
  Filled 2020-03-24: qty 1

## 2020-03-24 MED ORDER — LISINOPRIL 20 MG PO TABS
20.0000 mg | ORAL_TABLET | Freq: Every day | ORAL | Status: DC
  Administered 2020-03-25: 20 mg via ORAL
  Filled 2020-03-24: qty 1

## 2020-03-24 MED ORDER — HYDRALAZINE HCL 20 MG/ML IJ SOLN
INTRAMUSCULAR | Status: DC | PRN
  Administered 2020-03-24: 10 mg via INTRAVENOUS

## 2020-03-24 MED ORDER — IOHEXOL 300 MG/ML  SOLN
INTRAMUSCULAR | Status: DC | PRN
  Administered 2020-03-24: 80 mL

## 2020-03-24 MED ORDER — NITROGLYCERIN 1 MG/10 ML FOR IR/CATH LAB
INTRA_ARTERIAL | Status: DC | PRN
  Administered 2020-03-24 (×2): 200 ug via INTRACORONARY

## 2020-03-24 MED ORDER — HYDROMORPHONE HCL 1 MG/ML IJ SOLN
0.5000 mg | Freq: Once | INTRAMUSCULAR | Status: AC
  Administered 2020-03-24: 0.5 mg via INTRAVENOUS

## 2020-03-24 MED ORDER — SODIUM CHLORIDE 0.9 % IV SOLN: INTRAVENOUS | Status: AC

## 2020-03-24 MED ORDER — AMLODIPINE BESYLATE 5 MG PO TABS: 2.5000 mg | ORAL_TABLET | Freq: Every day | ORAL | Status: DC

## 2020-03-24 MED ORDER — AMLODIPINE BESYLATE 5 MG PO TABS
5.0000 mg | ORAL_TABLET | Freq: Every day | ORAL | Status: DC
  Administered 2020-03-25: 5 mg via ORAL
  Filled 2020-03-24: qty 1

## 2020-03-24 MED ORDER — HYDRALAZINE HCL 20 MG/ML IJ SOLN
INTRAMUSCULAR | Status: AC
  Filled 2020-03-24: qty 1

## 2020-03-24 MED ORDER — ROSUVASTATIN CALCIUM 10 MG PO TABS
10.0000 mg | ORAL_TABLET | Freq: Every day | ORAL | Status: DC
  Administered 2020-03-25: 10 mg via ORAL
  Filled 2020-03-24 (×2): qty 1

## 2020-03-24 MED ORDER — SODIUM CHLORIDE 0.9 % IV SOLN
INTRAVENOUS | Status: DC | PRN
  Administered 2020-03-24: 1.75 mg/kg/h via INTRAVENOUS

## 2020-03-24 MED ORDER — HEPARIN (PORCINE) IN NACL 1000-0.9 UT/500ML-% IV SOLN
INTRAVENOUS | Status: AC
  Filled 2020-03-24: qty 1000

## 2020-03-24 MED ORDER — NITROGLYCERIN 1 MG/10 ML FOR IR/CATH LAB
INTRA_ARTERIAL | Status: AC
  Filled 2020-03-24: qty 10

## 2020-03-24 MED ORDER — CLOPIDOGREL BISULFATE 75 MG PO TABS
ORAL_TABLET | ORAL | Status: AC
  Administered 2020-03-24: 75 mg via ORAL
  Filled 2020-03-24: qty 1

## 2020-03-24 MED ORDER — HEPARIN SODIUM (PORCINE) 5000 UNIT/ML IJ SOLN: 5000.0000 [IU] | Freq: Three times a day (TID) | INTRAMUSCULAR | Status: DC

## 2020-03-24 MED ORDER — HEPARIN (PORCINE) IN NACL 1000-0.9 UT/500ML-% IV SOLN
INTRAVENOUS | Status: DC | PRN
  Administered 2020-03-24: 1000 mL

## 2020-03-24 MED ORDER — HEPARIN SODIUM (PORCINE) 1000 UNIT/ML IJ SOLN
INTRAMUSCULAR | Status: DC | PRN
  Administered 2020-03-24: 2500 [IU] via INTRAVENOUS

## 2020-03-24 MED ORDER — SUCRALFATE 1 G PO TABS
1.0000 g | ORAL_TABLET | Freq: Two times a day (BID) | ORAL | Status: DC
  Administered 2020-03-24 – 2020-03-25 (×2): 1 g via ORAL
  Filled 2020-03-24 (×3): qty 1

## 2020-03-24 MED ORDER — ASPIRIN EC 81 MG PO TBEC
81.0000 mg | DELAYED_RELEASE_TABLET | Freq: Every day | ORAL | Status: DC
  Administered 2020-03-25: 81 mg via ORAL
  Filled 2020-03-24: qty 1

## 2020-03-24 MED ORDER — EZETIMIBE 10 MG PO TABS
10.0000 mg | ORAL_TABLET | Freq: Every day | ORAL | Status: DC
  Administered 2020-03-25: 10 mg via ORAL
  Filled 2020-03-24 (×2): qty 1

## 2020-03-24 MED ORDER — VERAPAMIL HCL 2.5 MG/ML IV SOLN
INTRAVENOUS | Status: DC | PRN
  Administered 2020-03-24: 2.5 mg via INTRA_ARTERIAL

## 2020-03-24 MED ORDER — SALINE SPRAY 0.65 % NA SOLN
1.0000 | NASAL | Status: DC | PRN
  Filled 2020-03-24: qty 44

## 2020-03-24 MED ORDER — SODIUM CHLORIDE 0.9% FLUSH
3.0000 mL | Freq: Two times a day (BID) | INTRAVENOUS | Status: DC
  Administered 2020-03-24 – 2020-03-25 (×2): 3 mL via INTRAVENOUS

## 2020-03-24 MED ORDER — HYDRALAZINE HCL 20 MG/ML IJ SOLN: 10.0000 mg | INTRAMUSCULAR | Status: AC | PRN

## 2020-03-24 MED ORDER — BIVALIRUDIN TRIFLUOROACETATE 250 MG IV SOLR
INTRAVENOUS | Status: AC
  Filled 2020-03-24: qty 250

## 2020-03-24 MED ORDER — HYDROCODONE-ACETAMINOPHEN 5-325 MG PO TABS: 1.0000 | ORAL_TABLET | Freq: Four times a day (QID) | ORAL | Status: DC | PRN

## 2020-03-24 MED ORDER — HYDROMORPHONE HCL 1 MG/ML IJ SOLN
INTRAMUSCULAR | Status: AC
  Filled 2020-03-24: qty 0.5

## 2020-03-24 MED ORDER — FLUTICASONE FUROATE-VILANTEROL 100-25 MCG/INH IN AEPB
1.0000 | INHALATION_SPRAY | Freq: Every day | RESPIRATORY_TRACT | Status: DC
  Administered 2020-03-25: 1 via RESPIRATORY_TRACT
  Filled 2020-03-24: qty 28

## 2020-03-24 MED ORDER — LIDOCAINE HCL (PF) 1 % IJ SOLN
INTRAMUSCULAR | Status: AC
  Filled 2020-03-24: qty 30

## 2020-03-24 MED ORDER — FENTANYL CITRATE (PF) 100 MCG/2ML IJ SOLN
INTRAMUSCULAR | Status: DC | PRN
  Administered 2020-03-24 (×2): 25 ug via INTRAVENOUS

## 2020-03-24 MED ORDER — LIDOCAINE HCL (PF) 1 % IJ SOLN
INTRAMUSCULAR | Status: DC | PRN
  Administered 2020-03-24: 20 mL
  Administered 2020-03-24: .5 mL

## 2020-03-24 MED ORDER — CLOPIDOGREL BISULFATE 75 MG PO TABS
ORAL_TABLET | ORAL | Status: DC | PRN
  Administered 2020-03-24: 300 mg via ORAL

## 2020-03-24 MED ORDER — HEPARIN SODIUM (PORCINE) 5000 UNIT/ML IJ SOLN
INTRAMUSCULAR | Status: AC
  Administered 2020-03-24: 5000 [IU] via SUBCUTANEOUS
  Filled 2020-03-24: qty 1

## 2020-03-24 MED ORDER — METOPROLOL TARTRATE 50 MG PO TABS: 100.0000 mg | ORAL_TABLET | Freq: Two times a day (BID) | ORAL | Status: DC

## 2020-03-24 MED ORDER — MIDAZOLAM HCL 2 MG/2ML IJ SOLN
INTRAMUSCULAR | Status: DC | PRN
  Administered 2020-03-24: 1 mg via INTRAVENOUS

## 2020-03-24 SURGICAL SUPPLY — 20 items
BALLN TREK RX 2.5X8 (BALLOONS) ×2
BALLN ~~LOC~~ TREK RX 3.0X12 (BALLOONS) ×2
BALLOON TREK RX 2.5X8 (BALLOONS) IMPLANT
BALLOON ~~LOC~~ TREK RX 3.0X12 (BALLOONS) IMPLANT
CANNULA 5F STIFF (CANNULA) ×1 IMPLANT
CATH 5F 110X4 TIG (CATHETERS) ×1 IMPLANT
CATH INFINITI 5 FR JL3.5 (CATHETERS) ×1 IMPLANT
CATH LAUNCHER 6FR JR4 (CATHETERS) ×1 IMPLANT
DEVICE RAD TR BAND REGULAR (VASCULAR PRODUCTS) ×1 IMPLANT
GLIDESHEATH SLEND A-KIT 6F 22G (SHEATH) ×1 IMPLANT
GUIDEWIRE INQWIRE 1.5J.035X260 (WIRE) IMPLANT
INQWIRE 1.5J .035X260CM (WIRE) ×2
KIT ENCORE 26 ADVANTAGE (KITS) ×1 IMPLANT
KIT MANI 3VAL PERCEP (MISCELLANEOUS) ×2 IMPLANT
PACK CARDIAC CATH (CUSTOM PROCEDURE TRAY) ×2 IMPLANT
SHEATH AVANTI 5FR X 11CM (SHEATH) ×1 IMPLANT
SHEATH AVANTI 6FR X 11CM (SHEATH) ×1 IMPLANT
STENT RESOLUTE ONYX 3.0X15 (Permanent Stent) ×1 IMPLANT
WIRE G HI TQ BMW 190 (WIRE) ×1 IMPLANT
WIRE HITORQ VERSACORE ST 145CM (WIRE) ×1 IMPLANT

## 2020-03-24 NOTE — ED Notes (Signed)
No change in condition, will continue to monitor.  

## 2020-03-24 NOTE — ED Notes (Signed)
Report given to cath lab. 

## 2020-03-24 NOTE — ED Notes (Signed)
Report received from Lorrie RN. Patient care assumed. Patient/RN introduction complete. Will continue to monitor.  

## 2020-03-24 NOTE — H&P (View-Only) (Signed)
Cardiology Consult    Patient ID: Hannah Kirby MRN: [id number], DOB/AGE: 07/11/1945   Admit date: 03/23/2020 Date of Consult: 03/24/2020  Primary Physician: Birdie Sons, MD Primary Cardiologist: Ida Rogue, MD Requesting Provider: Chauncey Cruel. Reesa Chew, MD  Patient Profile    Hannah Kirby is a 75 y.o. female with a history of CAD s/p RCA stenting x 2 in 2008, HTN, HL, DMII, tob abuse, COPD, PVCs, stroke, anxiety, and CKD III, who is being seen today for the evaluation of NSTEMI at the request of Dr. Reesa Chew.  Past Medical History   Past Medical History:  Diagnosis Date  . Anemia   . Anxiety   . Arthritis   . CAD (coronary artery disease)    a. cath 02/2006: BMS x 2 to RCA, cath o/w without significant coronary disease; b. nuclear stress test 07/2014: No ischemia/infarct; c. 11/2017 MV: no isch/infarct, EF 55-65%.  . Chronic bronchitis ([place])    secondary to cigarette smoking  . Chronic kidney disease (CKD), stage III (moderate) (HCC)   . COPD (chronic obstructive pulmonary disease) ([place])   . Diabetes mellitus   . FHx: allergies   . GERD (gastroesophageal reflux disease)   . Goiter   . Granulomatous disease ([place])   . Hernia   . History of echocardiogram    a. 07/2019 Echo: EF 60-65%, no rwma, mod LVH. Nl RV size/fxn.  . Hyperlipidemia   . Hypertension   . Kidney stone on left side 2013  . Microalbuminuria   . Obesity   . Panic attacks   . PVC's (premature ventricular contractions)    a. 03/2018 [place]: Occas PVCs (2.5%). Triggered events assoc w/ PVC/PAC.  Marland Kitchen Retinal tear 2020  . Smokers' cough ([place])   . Spinal stenosis   . Stroke ([place]) 10/29/2016   mild left side weakness  . Stroke ([place]) 08/01/2019  . Tobacco abuse     Past Surgical History:  Procedure Laterality Date  . ABDOMINAL HYSTERECTOMY    . BREAST SURGERY    . CATARACT EXTRACTION W/PHACO Right 03/01/2017   Procedure: CATARACT EXTRACTION PHACO AND INTRAOCULAR LENS PLACEMENT ([place]) RIGHT DIABETIC;  Surgeon:  Leandrew Koyanagi, MD;  Location: [place];  Service: Ophthalmology;  Laterality: Right;  . CATARACT EXTRACTION W/PHACO Left 03/22/2017   Procedure: CATARACT EXTRACTION PHACO AND INTRAOCULAR LENS PLACEMENT ([place]) LEFT DIABETIC;  Surgeon: Leandrew Koyanagi, MD;  Location: [place];  Service: Ophthalmology;  Laterality: Left;  Diabetic - insulin and oral meds  . COLONOSCOPY WITH PROPOFOL N/A 09/23/2014   Procedure: COLONOSCOPY WITH PROPOFOL;  Surgeon: Lollie Sails, MD;  Location: [facility] ENDOSCOPY;  Service: Endoscopy;  Laterality: N/A;  . COLONOSCOPY WITH PROPOFOL N/A 01/11/2018   Procedure: COLONOSCOPY WITH PROPOFOL;  Surgeon: Lollie Sails, MD;  Location: [facility] ENDOSCOPY;  Service: Endoscopy;  Laterality: N/A;  . COLONOSCOPY WITH PROPOFOL N/A 04/24/2018   Procedure: COLONOSCOPY WITH PROPOFOL;  Surgeon: Lollie Sails, MD;  Location: [facility] ENDOSCOPY;  Service: Endoscopy;  Laterality: N/A;  . COLONOSCOPY WITH PROPOFOL N/A 11/19/2019   Procedure: COLONOSCOPY WITH PROPOFOL;  Surgeon: Lucilla Lame, MD;  Location: [facility] ENDOSCOPY;  Service: Endoscopy;  Laterality: N/A;  . CORONARY ANGIOPLASTY WITH STENT PLACEMENT  2008  . ESOPHAGOGASTRODUODENOSCOPY (EGD) WITH PROPOFOL N/A 12/30/2014   Procedure: ESOPHAGOGASTRODUODENOSCOPY (EGD) WITH PROPOFOL;  Surgeon: Lollie Sails, MD;  Location: [facility] ENDOSCOPY;  Service: Endoscopy;  Laterality: N/A;  . ESOPHAGOGASTRODUODENOSCOPY (EGD) WITH PROPOFOL N/A 07/19/2016   Procedure: ESOPHAGOGASTRODUODENOSCOPY (EGD) WITH PROPOFOL;  Surgeon: Gustavo Lah,  Billie Ruddy, MD;  Location: [facility] ENDOSCOPY;  Service: Endoscopy;  Laterality: N/A;  . ESOPHAGOGASTRODUODENOSCOPY (EGD) WITH PROPOFOL N/A 04/24/2018   Procedure: ESOPHAGOGASTRODUODENOSCOPY (EGD) WITH PROPOFOL;  Surgeon: Lollie Sails, MD;  Location: [facility] ENDOSCOPY;  Service: Endoscopy;  Laterality: N/A;  . hysterectomy (other)       Allergies  Allergies  Allergen Reactions  . Spironolactone  Shortness Of Breath  . Sulfonamide Derivatives Other (See Comments)    Last taken as a child; made her mouth break out  . Other Other (See Comments)    Mouth blisters  . Sulfa Antibiotics Other (See Comments)    Mouth blisters Welts on mouth   . Codeine Nausea And Vomiting and Other (See Comments)    Nausea  . Prednisone Palpitations and Other (See Comments)    Made her legs "feel weird."    History of Present Illness    75 year old female with the above past medical history including coronary artery disease, hypertension, hyperlipidemia, type 2 diabetes mellitus, tobacco abuse, COPD, PVCs, stroke, anxiety, panic disorder, and stage III chronic kidney disease.  She previously underwent bare-metal stenting x2 to the RCA in 2008.  Catheterization otherwise showed nonobstructive disease.  She has undergone stress testing since then with nonischemic Myoview was in June 2016 and again in October 2019.  Most recent echocardiogram showed an EF of 60 to 65% and moderate LVH in June 2021, at which time she was hospitalized with a stroke.  In the setting of prior strokes, she does have mild residual left-sided weakness but does ambulate without assistance.  She continues to smoke 1 pack/day.  She lives locally with her husband who is disabled.  She notes that they can sort of take care of each other.  She is relatively sedentary and does not routinely exercise.  She was in her usual state of health until the evening of Friday, January 28, when she laid down for bed and noted moderate substernal chest tightness moving into her back and upper arms.  After about an hour of symptoms, she took 1 sublingual nitroglycerin with almost immediate relief.  She had no symptoms throughout the day on Saturday but when she went to bed Saturday night, she had recurrence of chest tightness at this time it was associated with mild nausea and dyspnea.  Symptoms lasted about an hour and a half and resolved after 2 sublingual  nitroglycerin tablets.  Again, she had no symptoms throughout the day on Saturday despite usual activities, but had recurrent symptoms when lying down for bed on Sunday night.  She took 1 nitroglycerin and symptoms resolved after about an hour.  Because of recurrent symptoms over the weekend, she scheduled an appointment to see Dr. [name] on Monday, January 31.  She was not having any chest pain at that time.  ECG was unremarkable.  The troponin was checked in the office and returned at 1000.  At that point, the patient was contacted and advised to present back to the emergency department.  Since arrival, troponin has been trending down and is 648 this morning.  She has not had any recurrent chest pain in the setting of IV anticoagulation.  She is planned for diagnostic catheterization and currently n.p.o.  Inpatient Medications    . [MAR Hold] amLODipine  2.5 mg Oral Daily  . [MAR Hold] aspirin EC  81 mg Oral Daily  . [MAR Hold] clopidogrel  75 mg Oral Daily  . [MAR Hold] ezetimibe  10 mg Oral Daily  . [  MAR Hold] fluticasone furoate-vilanterol  1 puff Inhalation Daily  . [MAR Hold] insulin aspart  0-15 Units Subcutaneous TID WC  . [MAR Hold] insulin aspart  0-5 Units Subcutaneous QHS  . [MAR Hold] insulin glargine  10 Units Subcutaneous QHS  . [MAR Hold] lisinopril  20 mg Oral Daily  . [MAR Hold] metoprolol tartrate  100 mg Oral BID  . [MAR Hold] rosuvastatin  10 mg Oral Daily  . [MAR Hold] sodium chloride flush  3 mL Intravenous Q12H  . [MAR Hold] sucralfate  1 g Oral BID  . [MAR Hold] tiotropium  18 mcg Inhalation Daily  . [MAR Hold] cyanocobalamin  1,000 mcg Oral Daily    Family History    Family History  Problem Relation Age of Onset  . Heart failure Mother   . Stroke Mother   . Diabetes Mother   . Congestive Heart Failure Mother   . Lung cancer Father   . Breast cancer Maternal Aunt    She indicated that her mother is deceased. She indicated that her father is deceased. She  indicated that her sister is alive. She indicated that the status of her maternal aunt is unknown.   Social History    Social History   Socioeconomic History  . Marital status: Married    Spouse name: Janayia Burggraf  . Number of children: 3  . Years of education: Not on file  . Highest education level: 12th grade  Occupational History  . Occupation: retired  Tobacco Use  . Smoking status: Current Every Day Smoker    Packs/day: 1.00    Years: 40.00    Pack years: 40.00    Types: Cigarettes  . Smokeless tobacco: Never Used  Vaping Use  . Vaping Use: Former  Substance and Sexual Activity  . Alcohol use: Yes    Comment: 0-1 glasses of wine every few month  . Drug use: No  . Sexual activity: Not on file  Other Topics Concern  . Not on file  Social History Narrative   Retired. Married.  Lives in [place] with her husband.  She is not routinely exercising.   Social Determinants of Health   Financial Resource Strain: Low Risk   . Difficulty of Paying Living Expenses: Not hard at all  Food Insecurity: No Food Insecurity  . Worried About Charity fundraiser in the Last Year: Never true  . Ran Out of Food in the Last Year: Never true  Transportation Needs: No Transportation Needs  . Lack of Transportation (Medical): No  . Lack of Transportation (Non-Medical): No  Physical Activity: Inactive  . Days of Exercise per Week: 0 days  . Minutes of Exercise per Session: 0 min  Stress: No Stress Concern Present  . Feeling of Stress : Only a little  Social Connections: Moderately Isolated  . Frequency of Communication with Friends and Family: More than three times a week  . Frequency of Social Gatherings with Friends and Family: Twice a week  . Attends Religious Services: Never  . Active Member of Clubs or Organizations: No  . Attends Archivist Meetings: Never  . Marital Status: Married  Human resources officer Violence: Not At Risk  . Fear of Current or Ex-Partner: No  .  Emotionally Abused: No  . Physically Abused: No  . Sexually Abused: No     Review of Systems    General:  No chills, fever, night sweats or weight changes.  Cardiovascular: +++ chest pain, +++ chronic dyspnea on  exertion, no edema, orthopnea, palpitations, paroxysmal nocturnal dyspnea. Dermatological: No rash, lesions/masses Respiratory: No cough, +++ dyspnea Urologic: No hematuria, dysuria Abdominal:   Mild nausea with chest discomfort on Saturday evening.  No vomiting, diarrhea, bright red blood per rectum, melena, or hematemesis Neurologic:  No visual changes, wkns, changes in mental status. All other systems reviewed and are otherwise negative except as noted above.  Physical Exam    Blood pressure (!) 165/93, pulse 64, temperature 98.1 F (36.7 C), temperature source Oral, resp. rate 18, height 5\' 5"  (1.651 m), weight 76.2 kg, SpO2 92 %.  General: Pleasant, NAD Psych: Normal affect. Neuro: Alert and oriented X 3. Moves all extremities spontaneously. HEENT: Normal  Neck: Supple without bruits or JVD. Lungs:  Resp regular and unlabored, diminished breath sounds throughout with scattered rhonchi and diffuse inspiratory and expiratory wheezing. Heart: RRR no s3, s4, or murmurs. Abdomen: Soft, non-tender, non-distended, BS + x 4.  Extremities: No clubbing, cyanosis or edema. DP/PT2+, Radials 2+ and equal bilaterally.  Labs    Cardiac Enzymes Recent Labs  Lab 03/23/20 0043 03/23/20 1516 03/23/20 1800 03/23/20 2008 03/24/20 0333  TROPONINIHS 662* 1,000* 970* 1,040* 648*      Lab Results  Component Value Date   WBC 9.8 03/24/2020   HGB 15.7 (H) 03/24/2020   HCT 46.6 (H) 03/24/2020   MCV 96.7 03/24/2020   PLT 205 03/24/2020    Recent Labs  Lab 03/23/20 1800  NA 141  K 3.8  CL 102  CO2 26  BUN 18  CREATININE 1.45*  CALCIUM 9.9  PROT 7.3  BILITOT 0.6  ALKPHOS 86  ALT 19  AST 34  GLUCOSE 197*   Lab Results  Component Value Date   CHOL 189 07/12/2019    HDL 51 07/12/2019   LDLCALC 76 07/12/2019   TRIG 312 (A) 07/12/2019    Radiology Studies    MR LUMBAR SPINE WO CONTRAST  Result Date: 02/27/2020 CLINICAL DATA:  Chronic low back pain radiating into right lower extremity EXAM: MRI LUMBAR SPINE WITHOUT CONTRAST TECHNIQUE: Multiplanar, multisequence MR imaging of the lumbar spine was performed. No intravenous contrast was administered. COMPARISON:  2014 FINDINGS: Segmentation: Transitional anatomy at the lumbosacral junction. For the purposes of this dictation, there is a partially lumbarized S1 with the L5-S1 disc space on series 8, image 26. Alignment: Levoscoliosis.  Anteroposterior alignment is maintained. Vertebrae: Vertebral body heights are preserved. Degenerative endplate irregularity is present at L3-L4. There is no substantial marrow edema. No suspicious osseous lesion. Conus medullaris and cauda equina: Conus extends to the T12-L1 level. Conus and cauda equina appear normal. Paraspinal and other soft tissues: Bilateral renal cysts. Disc levels: L1-L2:  No canal or foraminal stenosis. L2-L3:  No canal or foraminal stenosis. L3-L4: Disc bulge with endplate osteophytic ridging eccentric to the right. Mild facet arthropathy with ligamentum flavum infolding. No canal or left foraminal stenosis. Mild to moderate right foraminal stenosis. L4-L5: Disc bulge. Moderate facet arthropathy with ligamentum flavum infolding. Minor canal and foraminal stenosis. L5-S1: Disc bulge. Moderate facet arthropathy with ligamentum flavum infolding. No canal or right foraminal stenosis. Minor left foraminal stenosis. IMPRESSION: Multilevel degenerative changes without high-grade stenosis. Electronically Signed   By: Macy Mis M.D.   On: 02/27/2020 15:08   DG Chest Port 1 View  Result Date: 03/23/2020 CLINICAL DATA:  Left-sided chest pain EXAM: PORTABLE CHEST 1 VIEW COMPARISON:  11/26/2018 FINDINGS: Cardiac shadow is stable. Aortic calcifications are seen. Lungs are  clear bilaterally. No bony abnormality  is seen. IMPRESSION: No acute abnormality noted. Electronically Signed   By: Inez Catalina M.D.   On: 03/23/2020 19:29    ECG & Cardiac Imaging    RSR, 75, LAE, ant infarct  - personally reviewed.  Assessment & Plan    1.  Non-STEMI/CAD: Patient with prior history of CAD status post RCA stenting x2 in 2008.  She subsequently had negative Myoview was in 2016 and most recently October 2019.  Over 3 nights preceding admission, she experienced nocturnal angina lasting up to 90 minutes, and resolving with nitroglycerin.  The worst episode occurred on Saturday evening.  She was seen in the office on January 31 and a troponin was evaluated and elevated at 1000.  In that setting, she was advised to present to the emergency department.  Troponin eventually rose to 1040 but is down to 648 this morning.  She has been on heparin overnight and is currently chest pain-free.  She is scheduled diagnostic catheterization this morning.  The patient understands that risks include but are not limited to stroke (1 in 1000), death (1 in 36), kidney failure [usually temporary] (1 in 500), bleeding (1 in 200), allergic reaction [possibly serious] (1 in 200), and agrees to proceed.  Continue aspirin, Plavix, statin, Zetia, heparin, and beta-blocker therapy.  2.  Essential hypertension: Blood pressure elevated this morning.  She says she did not sleep well in the emergency department last night and was very uncomfortable on the stretcher.  Continue beta-blocker and calcium channel blocker.  Hold ACE inhibitor for now in the setting of stage III kidney disease and pending catheterization.  Follow pressures and adjust medications as necessary.  3.  Hyperlipidemia: LDL of 76 in May 2021.  She is currently on rosuvastatin 10 mg and Zetia 10 mg.  Plan to follow-up lipids and adjust rosuvastatin if she can tolerate a higher dose.  Consider Vascepa for elevated triglycerides.  4.  Tobacco  abuse/COPD: Diminished breath sounds with scattered rhonchi and wheezing on examination.  Has been smoking 1 pack of cigarettes per day at home.  Complete cessation advised.  Inhaler therapy per internal medicine.  5.  Stage III chronic kidney disease.  Creatinine stable at 1.45 this morning.  Will hydrate pre and post cath.  6.  Type 2 diabetes mellitus: Insulin management per internal medicine.  Will hold ACE inhibitor given CKD 3 and pending cath/contrast.  Signed, Murray Hodgkins, NP 03/24/2020, 9:09 AM  For questions or updates, please contact   Please consult [email or web address] for contact info under Cardiology/STEMI.

## 2020-03-24 NOTE — ED Notes (Signed)
Pt transported to cath lab at this time.

## 2020-03-24 NOTE — Interval H&P Note (Signed)
History and Physical Interval Note:  03/24/2020 9:43 AM  Hannah Kirby  has presented today for surgery, with the diagnosis of NSTEMI.  The various methods of treatment have been discussed with the patient and family. After consideration of risks, benefits and other options for treatment, the patient has consented to  Procedure(s): LEFT HEART CATH AND CORONARY ANGIOGRAPHY (N/A) as a surgical intervention.  The patient's history has been reviewed, patient examined, no change in status, stable for surgery.  I have reviewed the patient's chart and labs.  Questions were answered to the patient's satisfaction.    Cath Lab Visit (complete for each Cath Lab visit)  Clinical Evaluation Leading to the Procedure:   ACS: Yes.    Non-ACS:    [name] [name]

## 2020-03-24 NOTE — Progress Notes (Signed)
PROGRESS NOTE    Hannah Kirby  [id number] DOB: [birth year] DOA: 03/23/2020 PCP: Birdie Sons, MD   Brief Narrative: Taken from H&P. Hannah Kirby is a 75 y.o. female with medical history significant for CAD s/p stent to RCA 2008, hypertension, insulin-dependent type 2 diabetes, CKD 3A, CVA who presents with concerns of chest tightness and elevated troponin.  About 3 days ago patient started to notice that at night she would have anterior chest tightness and pressure.  This would only happen at night around dinnertime and when she is doing some chores.  No heavy lifting.  Does not feel it during the day or afternoon.  Took several doses of nitro over the weekend with relief.  She also notes some associated shortness of breath when she goes to sleep but thinks is because she is having panic attacks.  She denies any nausea or vomiting.  She continues to be a heavy smoker using 1 pack of tobacco per day.  She is compliant with her medications.  She presented to her cardiologist with the symptoms today and was found to have elevated troponin of the 1000 and EKG showing sinus rhythm with nonspecific ST changes.  Admitted for NSTEMI and was taken to cardiac Cath Lab today, found to have 90% stenosis of RCA, successful PCI done.  Subjective: Patient denies any chest pain.  Going for cardiac catheterization.  Assessment & Plan:   Principal Problem:   NSTEMI (non-ST elevated myocardial infarction) ([place]) Active Problems:   Tobacco use disorder   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[place])   Essential hypertension   Stage 3b chronic kidney disease ([place])   History of CVA (cerebrovascular accident)  NSTEMI. Patient has an history of CAD and PCI. Troponin peaked at 1040. Underwent cardiac catheterization today and found to have 90% stenosis of RCA, s/p successful PCI. -Cardiology is recommending DAPT for 1 year. -Beta-blocker -Continue with statin -Cardiac  rehab.  Hypertension. Blood pressure within goal. -Continue home dose of amlodipine, metoprolol and lisinopril.  Insulin-dependent type 2 diabetes Hemoglobin A1c of 8.2  back in Sept 2021 Takes 22 units of Lantus nightly at home -Continue with 10U of Lantus and moderate SSI here  CKD stage IIIb Creatinine baseline of 1.3-1.6.  Currently stable. Avoid nephrotoxic agent. Patient had cardiac catheterization today. -Gentle hydration post cath. -Monitor renal function  History of CVA Continue aspirin and Plavix Continue statin and Zetia  Tobacco use Uses 1 pack/day.  Strongly encouraged cessation given her history of repeated MI and CVA -Nicotine patch if needed  Objective: Vitals:   03/24/20 0817 03/24/20 0900 03/24/20 1145 03/24/20 1200  BP: (!) 160/80 (!) 165/93 126/74 (!) 146/74  Pulse: 66 64 70 72  Resp: 20 18 (!) 23 19  Temp: 98 F (36.7 C) 98.1 F (36.7 C)    TempSrc: Oral Oral    SpO2: 92%  96% 94%  Weight:  76.2 kg    Height:  5\' 5"  (1.651 m)      Intake/Output Summary (Last 24 hours) at 03/24/2020 1237 Last data filed at 03/24/2020 0818 Gross per 24 hour  Intake 155.46 ml  Output -  Net 155.46 ml   Filed Weights   03/23/20 1757 03/24/20 0724 03/24/20 0900  Weight: 76.2 kg 76.2 kg 76.2 kg    Examination:  General exam: Appears calm and comfortable  Respiratory system: Clear to auscultation. Respiratory effort normal. Cardiovascular system: S1 & S2 heard, RRR.  Gastrointestinal system: Soft, nontender, nondistended, bowel sounds positive.  Central nervous system: Alert and oriented. No focal neurological deficits. Extremities: No edema, no cyanosis, pulses intact and symmetrical. Psychiatry: Judgement and insight appear normal. Mood & affect appropriate.    DVT prophylaxis: Heparin Code Status: Full Family Communication: Discussed with patient Disposition Plan:  Status is: Inpatient  Remains inpatient appropriate because:Inpatient level of care  appropriate due to severity of illness   Dispo: The patient is from: Home              Anticipated d/c is to: Home              Anticipated d/c date is: 1 day              Patient currently is not medically stable to d/c.   Difficult to place patient No              Level of care: Progressive Cardiac  Consultants:   Cardiology  Procedures:  Antimicrobials:   Data Reviewed: I have personally reviewed following labs and imaging studies  CBC: Recent Labs  Lab 03/23/20 1800 03/24/20 0520  WBC 10.1 9.8  HGB 17.0* 15.7*  HCT 50.5* 46.6*  MCV 96.7 96.7  PLT 221 284   Basic Metabolic Panel: Recent Labs  Lab 03/23/20 1800  NA 141  K 3.8  CL 102  CO2 26  GLUCOSE 197*  BUN 18  CREATININE 1.45*  CALCIUM 9.9   GFR: Estimated Creatinine Clearance: 35.3 mL/min (A) (by C-G formula based on SCr of 1.45 mg/dL (H)). Liver Function Tests: Recent Labs  Lab 03/23/20 1800  AST 34  ALT 19  ALKPHOS 86  BILITOT 0.6  PROT 7.3  ALBUMIN 3.8   No results for input(s): LIPASE, AMYLASE in the last 168 hours. No results for input(s): AMMONIA in the last 168 hours. Coagulation Profile: Recent Labs  Lab 03/23/20 1800  INR 1.0   Cardiac Enzymes: No results for input(s): CKTOTAL, CKMB, CKMBINDEX, TROPONINI in the last 168 hours. BNP (last 3 results) No results for input(s): PROBNP in the last 8760 hours. HbA1C: No results for input(s): HGBA1C in the last 72 hours. CBG: Recent Labs  Lab 03/23/20 2115 03/24/20 0757 03/24/20 1223  GLUCAP 119* 135* 112*   Lipid Profile: No results for input(s): CHOL, HDL, LDLCALC, TRIG, CHOLHDL, LDLDIRECT in the last 72 hours. Thyroid Function Tests: No results for input(s): TSH, T4TOTAL, FREET4, T3FREE, THYROIDAB in the last 72 hours. Anemia Panel: No results for input(s): VITAMINB12, FOLATE, FERRITIN, TIBC, IRON, RETICCTPCT in the last 72 hours. Sepsis Labs: No results for input(s): PROCALCITON, LATICACIDVEN in the last 168  hours.  Recent Results (from the past 240 hour(s))  SARS CORONAVIRUS 2 (TAT 6-24 HRS) Nasopharyngeal Nasopharyngeal Swab     Status: None   Collection Time: 03/23/20  6:25 PM   Specimen: Nasopharyngeal Swab  Result Value Ref Range Status   SARS Coronavirus 2 NEGATIVE NEGATIVE Final    Comment: (NOTE) SARS-CoV-2 target nucleic acids are NOT DETECTED.  The SARS-CoV-2 RNA is generally detectable in upper and lower respiratory specimens during the acute phase of infection. Negative results do not preclude SARS-CoV-2 infection, do not rule out co-infections with other pathogens, and should not be used as the sole basis for treatment or other patient management decisions. Negative results must be combined with clinical observations, patient history, and epidemiological information. The expected result is Negative.  Fact Sheet for Patients: SugarRoll.be  Fact Sheet for Healthcare Providers: https://www.woods-mathews.com/  This test is not yet approved or cleared  by the [place] and  has been authorized for detection and/or diagnosis of SARS-CoV-2 by FDA under an Emergency Use Authorization (EUA). This EUA will remain  in effect (meaning this test can be used) for the duration of the COVID-19 declaration under Se ction 564(b)(1) of the Act, 21 U.S.C. section 360bbb-3(b)(1), unless the authorization is terminated or revoked sooner.  Performed at [facility], [place] [address] [place], [place] [address]      Radiology Studies: CARDIAC CATHETERIZATION  Result Date: 03/24/2020 Conclusions: 1. Severe single-vessel coronary artery disease with 99% distal RCA stenosis. 2. Mild, nonobstructive left coronary artery disease with minimal luminal irregularities involving the LAD and 20-30% ostial ramus stenosis. 3. Patent proximal/mid RCA stents with mild to moderate in-stent restenosis (~30%). 4. Mildly elevated left ventricular filling  pressure (LVEDP 15-20 mmHg). 5. Successful PCI to distal RCA using Resolute Onyx 3.0 x 15 mm drug-eluting stent with 0% residual stenosis and TIMI-3 flow. 6. Right radial artery loop; consider alternative access for future catheterizations. Recommendations: 1. Remove right femoral artery sheath 2 hours after completion of procedure. 2. Dual antiplatelet therapy with aspirin and clopidogrel for at least 12 months. 3. Aggressive secondary prevention. 4. Obtain echocardiogram. 5. Gentle post catheterization hydration in the setting of chronic kidney disease and mildly elevated left ventricular filling pressure. Nelva Bush, MD [facility]   DG Chest Port 1 View  Result Date: 03/23/2020 CLINICAL DATA:  Left-sided chest pain EXAM: PORTABLE CHEST 1 VIEW COMPARISON:  11/26/2018 FINDINGS: Cardiac shadow is stable. Aortic calcifications are seen. Lungs are clear bilaterally. No bony abnormality is seen. IMPRESSION: No acute abnormality noted. Electronically Signed   By: Inez Catalina M.D.   On: 03/23/2020 19:29    Scheduled Meds: . [START ON 03/25/2020] amLODipine  5 mg Oral Daily  . [MAR Hold] aspirin EC  81 mg Oral Daily  . [START ON 03/25/2020] clopidogrel  75 mg Oral Q breakfast  . [MAR Hold] ezetimibe  10 mg Oral Daily  . [MAR Hold] fluticasone furoate-vilanterol  1 puff Inhalation Daily  . heparin  5,000 Units Subcutaneous Q8H  . [MAR Hold] insulin aspart  0-15 Units Subcutaneous TID WC  . [MAR Hold] insulin aspart  0-5 Units Subcutaneous QHS  . [MAR Hold] insulin glargine  10 Units Subcutaneous QHS  . [MAR Hold] lisinopril  20 mg Oral Daily  . [MAR Hold] metoprolol tartrate  100 mg Oral BID  . [MAR Hold] rosuvastatin  10 mg Oral Daily  . [MAR Hold] sodium chloride flush  3 mL Intravenous Q12H  . sodium chloride flush  3 mL Intravenous Q12H  . [MAR Hold] sucralfate  1 g Oral BID  . [MAR Hold] tiotropium  18 mcg Inhalation Daily  . [MAR Hold] cyanocobalamin  1,000 mcg Oral Daily   Continuous  Infusions: . sodium chloride 75 mL/hr at 03/24/20 1228  . sodium chloride       LOS: 1 day   Time spent: 35 minutes.  Lorella Nimrod, MD [facility]  If 7PM-7AM, please contact night-coverage [email or web address]  03/24/2020, 12:37 PM   This record has been created using Systems analyst. Errors have been sought and corrected,but may not always be located. Such creation errors do not reflect on the standard of care.

## 2020-03-24 NOTE — Consult Note (Signed)
Cardiology Consult    Patient ID: DAMA HEDGEPETH MRN: [id number], DOB/AGE: 07/11/1945   Admit date: 03/23/2020 Date of Consult: 03/24/2020  Primary Physician: Birdie Sons, MD Primary Cardiologist: Ida Rogue, MD Requesting Provider: Chauncey Cruel. Reesa Chew, MD  Patient Profile    Hannah Kirby is a 75 y.o. female with a history of CAD s/p RCA stenting x 2 in 2008, HTN, HL, DMII, tob abuse, COPD, PVCs, stroke, anxiety, and CKD III, who is being seen today for the evaluation of NSTEMI at the request of Dr. Reesa Chew.  Past Medical History   Past Medical History:  Diagnosis Date  . Anemia   . Anxiety   . Arthritis   . CAD (coronary artery disease)    a. cath 02/2006: BMS x 2 to RCA, cath o/w without significant coronary disease; b. nuclear stress test 07/2014: No ischemia/infarct; c. 11/2017 MV: no isch/infarct, EF 55-65%.  . Chronic bronchitis ([place])    secondary to cigarette smoking  . Chronic kidney disease (CKD), stage III (moderate) (HCC)   . COPD (chronic obstructive pulmonary disease) ([place])   . Diabetes mellitus   . FHx: allergies   . GERD (gastroesophageal reflux disease)   . Goiter   . Granulomatous disease ([place])   . Hernia   . History of echocardiogram    a. 07/2019 Echo: EF 60-65%, no rwma, mod LVH. Nl RV size/fxn.  . Hyperlipidemia   . Hypertension   . Kidney stone on left side 2013  . Microalbuminuria   . Obesity   . Panic attacks   . PVC's (premature ventricular contractions)    a. 03/2018 [place]: Occas PVCs (2.5%). Triggered events assoc w/ PVC/PAC.  Marland Kitchen Retinal tear 2020  . Smokers' cough ([place])   . Spinal stenosis   . Stroke ([place]) 10/29/2016   mild left side weakness  . Stroke ([place]) 08/01/2019  . Tobacco abuse     Past Surgical History:  Procedure Laterality Date  . ABDOMINAL HYSTERECTOMY    . BREAST SURGERY    . CATARACT EXTRACTION W/PHACO Right 03/01/2017   Procedure: CATARACT EXTRACTION PHACO AND INTRAOCULAR LENS PLACEMENT ([place]) RIGHT DIABETIC;  Surgeon:  Leandrew Koyanagi, MD;  Location: [place];  Service: Ophthalmology;  Laterality: Right;  . CATARACT EXTRACTION W/PHACO Left 03/22/2017   Procedure: CATARACT EXTRACTION PHACO AND INTRAOCULAR LENS PLACEMENT ([place]) LEFT DIABETIC;  Surgeon: Leandrew Koyanagi, MD;  Location: [place];  Service: Ophthalmology;  Laterality: Left;  Diabetic - insulin and oral meds  . COLONOSCOPY WITH PROPOFOL N/A 09/23/2014   Procedure: COLONOSCOPY WITH PROPOFOL;  Surgeon: Lollie Sails, MD;  Location: [facility] ENDOSCOPY;  Service: Endoscopy;  Laterality: N/A;  . COLONOSCOPY WITH PROPOFOL N/A 01/11/2018   Procedure: COLONOSCOPY WITH PROPOFOL;  Surgeon: Lollie Sails, MD;  Location: [facility] ENDOSCOPY;  Service: Endoscopy;  Laterality: N/A;  . COLONOSCOPY WITH PROPOFOL N/A 04/24/2018   Procedure: COLONOSCOPY WITH PROPOFOL;  Surgeon: Lollie Sails, MD;  Location: [facility] ENDOSCOPY;  Service: Endoscopy;  Laterality: N/A;  . COLONOSCOPY WITH PROPOFOL N/A 11/19/2019   Procedure: COLONOSCOPY WITH PROPOFOL;  Surgeon: Lucilla Lame, MD;  Location: [facility] ENDOSCOPY;  Service: Endoscopy;  Laterality: N/A;  . CORONARY ANGIOPLASTY WITH STENT PLACEMENT  2008  . ESOPHAGOGASTRODUODENOSCOPY (EGD) WITH PROPOFOL N/A 12/30/2014   Procedure: ESOPHAGOGASTRODUODENOSCOPY (EGD) WITH PROPOFOL;  Surgeon: Lollie Sails, MD;  Location: [facility] ENDOSCOPY;  Service: Endoscopy;  Laterality: N/A;  . ESOPHAGOGASTRODUODENOSCOPY (EGD) WITH PROPOFOL N/A 07/19/2016   Procedure: ESOPHAGOGASTRODUODENOSCOPY (EGD) WITH PROPOFOL;  Surgeon: Gustavo Lah,  Billie Ruddy, MD;  Location: [facility] ENDOSCOPY;  Service: Endoscopy;  Laterality: N/A;  . ESOPHAGOGASTRODUODENOSCOPY (EGD) WITH PROPOFOL N/A 04/24/2018   Procedure: ESOPHAGOGASTRODUODENOSCOPY (EGD) WITH PROPOFOL;  Surgeon: Lollie Sails, MD;  Location: [facility] ENDOSCOPY;  Service: Endoscopy;  Laterality: N/A;  . hysterectomy (other)       Allergies  Allergies  Allergen Reactions  . Spironolactone  Shortness Of Breath  . Sulfonamide Derivatives Other (See Comments)    Last taken as a child; made her mouth break out  . Other Other (See Comments)    Mouth blisters  . Sulfa Antibiotics Other (See Comments)    Mouth blisters Welts on mouth   . Codeine Nausea And Vomiting and Other (See Comments)    Nausea  . Prednisone Palpitations and Other (See Comments)    Made her legs "feel weird."    History of Present Illness    75 year old female with the above past medical history including coronary artery disease, hypertension, hyperlipidemia, type 2 diabetes mellitus, tobacco abuse, COPD, PVCs, stroke, anxiety, panic disorder, and stage III chronic kidney disease.  She previously underwent bare-metal stenting x2 to the RCA in 2008.  Catheterization otherwise showed nonobstructive disease.  She has undergone stress testing since then with nonischemic Myoview was in June 2016 and again in October 2019.  Most recent echocardiogram showed an EF of 60 to 65% and moderate LVH in June 2021, at which time she was hospitalized with a stroke.  In the setting of prior strokes, she does have mild residual left-sided weakness but does ambulate without assistance.  She continues to smoke 1 pack/day.  She lives locally with her husband who is disabled.  She notes that they can sort of take care of each other.  She is relatively sedentary and does not routinely exercise.  She was in her usual state of health until the evening of Friday, January 28, when she laid down for bed and noted moderate substernal chest tightness moving into her back and upper arms.  After about an hour of symptoms, she took 1 sublingual nitroglycerin with almost immediate relief.  She had no symptoms throughout the day on Saturday but when she went to bed Saturday night, she had recurrence of chest tightness at this time it was associated with mild nausea and dyspnea.  Symptoms lasted about an hour and a half and resolved after 2 sublingual  nitroglycerin tablets.  Again, she had no symptoms throughout the day on Saturday despite usual activities, but had recurrent symptoms when lying down for bed on Sunday night.  She took 1 nitroglycerin and symptoms resolved after about an hour.  Because of recurrent symptoms over the weekend, she scheduled an appointment to see Dr. [name] on Monday, January 31.  She was not having any chest pain at that time.  ECG was unremarkable.  The troponin was checked in the office and returned at 1000.  At that point, the patient was contacted and advised to present back to the emergency department.  Since arrival, troponin has been trending down and is 648 this morning.  She has not had any recurrent chest pain in the setting of IV anticoagulation.  She is planned for diagnostic catheterization and currently n.p.o.  Inpatient Medications    . [MAR Hold] amLODipine  2.5 mg Oral Daily  . [MAR Hold] aspirin EC  81 mg Oral Daily  . [MAR Hold] clopidogrel  75 mg Oral Daily  . [MAR Hold] ezetimibe  10 mg Oral Daily  . [  MAR Hold] fluticasone furoate-vilanterol  1 puff Inhalation Daily  . [MAR Hold] insulin aspart  0-15 Units Subcutaneous TID WC  . [MAR Hold] insulin aspart  0-5 Units Subcutaneous QHS  . [MAR Hold] insulin glargine  10 Units Subcutaneous QHS  . [MAR Hold] lisinopril  20 mg Oral Daily  . [MAR Hold] metoprolol tartrate  100 mg Oral BID  . [MAR Hold] rosuvastatin  10 mg Oral Daily  . [MAR Hold] sodium chloride flush  3 mL Intravenous Q12H  . [MAR Hold] sucralfate  1 g Oral BID  . [MAR Hold] tiotropium  18 mcg Inhalation Daily  . [MAR Hold] cyanocobalamin  1,000 mcg Oral Daily    Family History    Family History  Problem Relation Age of Onset  . Heart failure Mother   . Stroke Mother   . Diabetes Mother   . Congestive Heart Failure Mother   . Lung cancer Father   . Breast cancer Maternal Aunt    She indicated that her mother is deceased. She indicated that her father is deceased. She  indicated that her sister is alive. She indicated that the status of her maternal aunt is unknown.   Social History    Social History   Socioeconomic History  . Marital status: Married    Spouse name: Janayia Burggraf  . Number of children: 3  . Years of education: Not on file  . Highest education level: 12th grade  Occupational History  . Occupation: retired  Tobacco Use  . Smoking status: Current Every Day Smoker    Packs/day: 1.00    Years: 40.00    Pack years: 40.00    Types: Cigarettes  . Smokeless tobacco: Never Used  Vaping Use  . Vaping Use: Former  Substance and Sexual Activity  . Alcohol use: Yes    Comment: 0-1 glasses of wine every few month  . Drug use: No  . Sexual activity: Not on file  Other Topics Concern  . Not on file  Social History Narrative   Retired. Married.  Lives in [place] with her husband.  She is not routinely exercising.   Social Determinants of Health   Financial Resource Strain: Low Risk   . Difficulty of Paying Living Expenses: Not hard at all  Food Insecurity: No Food Insecurity  . Worried About Charity fundraiser in the Last Year: Never true  . Ran Out of Food in the Last Year: Never true  Transportation Needs: No Transportation Needs  . Lack of Transportation (Medical): No  . Lack of Transportation (Non-Medical): No  Physical Activity: Inactive  . Days of Exercise per Week: 0 days  . Minutes of Exercise per Session: 0 min  Stress: No Stress Concern Present  . Feeling of Stress : Only a little  Social Connections: Moderately Isolated  . Frequency of Communication with Friends and Family: More than three times a week  . Frequency of Social Gatherings with Friends and Family: Twice a week  . Attends Religious Services: Never  . Active Member of Clubs or Organizations: No  . Attends Archivist Meetings: Never  . Marital Status: Married  Human resources officer Violence: Not At Risk  . Fear of Current or Ex-Partner: No  .  Emotionally Abused: No  . Physically Abused: No  . Sexually Abused: No     Review of Systems    General:  No chills, fever, night sweats or weight changes.  Cardiovascular: +++ chest pain, +++ chronic dyspnea on  exertion, no edema, orthopnea, palpitations, paroxysmal nocturnal dyspnea. Dermatological: No rash, lesions/masses Respiratory: No cough, +++ dyspnea Urologic: No hematuria, dysuria Abdominal:   Mild nausea with chest discomfort on Saturday evening.  No vomiting, diarrhea, bright red blood per rectum, melena, or hematemesis Neurologic:  No visual changes, wkns, changes in mental status. All other systems reviewed and are otherwise negative except as noted above.  Physical Exam    Blood pressure (!) 165/93, pulse 64, temperature 98.1 F (36.7 C), temperature source Oral, resp. rate 18, height 5\' 5"  (1.651 m), weight 76.2 kg, SpO2 92 %.  General: Pleasant, NAD Psych: Normal affect. Neuro: Alert and oriented X 3. Moves all extremities spontaneously. HEENT: Normal  Neck: Supple without bruits or JVD. Lungs:  Resp regular and unlabored, diminished breath sounds throughout with scattered rhonchi and diffuse inspiratory and expiratory wheezing. Heart: RRR no s3, s4, or murmurs. Abdomen: Soft, non-tender, non-distended, BS + x 4.  Extremities: No clubbing, cyanosis or edema. DP/PT2+, Radials 2+ and equal bilaterally.  Labs    Cardiac Enzymes Recent Labs  Lab 03/23/20 0043 03/23/20 1516 03/23/20 1800 03/23/20 2008 03/24/20 0333  TROPONINIHS 662* 1,000* 970* 1,040* 648*      Lab Results  Component Value Date   WBC 9.8 03/24/2020   HGB 15.7 (H) 03/24/2020   HCT 46.6 (H) 03/24/2020   MCV 96.7 03/24/2020   PLT 205 03/24/2020    Recent Labs  Lab 03/23/20 1800  NA 141  K 3.8  CL 102  CO2 26  BUN 18  CREATININE 1.45*  CALCIUM 9.9  PROT 7.3  BILITOT 0.6  ALKPHOS 86  ALT 19  AST 34  GLUCOSE 197*   Lab Results  Component Value Date   CHOL 189 07/12/2019    HDL 51 07/12/2019   LDLCALC 76 07/12/2019   TRIG 312 (A) 07/12/2019    Radiology Studies    MR LUMBAR SPINE WO CONTRAST  Result Date: 02/27/2020 CLINICAL DATA:  Chronic low back pain radiating into right lower extremity EXAM: MRI LUMBAR SPINE WITHOUT CONTRAST TECHNIQUE: Multiplanar, multisequence MR imaging of the lumbar spine was performed. No intravenous contrast was administered. COMPARISON:  2014 FINDINGS: Segmentation: Transitional anatomy at the lumbosacral junction. For the purposes of this dictation, there is a partially lumbarized S1 with the L5-S1 disc space on series 8, image 26. Alignment: Levoscoliosis.  Anteroposterior alignment is maintained. Vertebrae: Vertebral body heights are preserved. Degenerative endplate irregularity is present at L3-L4. There is no substantial marrow edema. No suspicious osseous lesion. Conus medullaris and cauda equina: Conus extends to the T12-L1 level. Conus and cauda equina appear normal. Paraspinal and other soft tissues: Bilateral renal cysts. Disc levels: L1-L2:  No canal or foraminal stenosis. L2-L3:  No canal or foraminal stenosis. L3-L4: Disc bulge with endplate osteophytic ridging eccentric to the right. Mild facet arthropathy with ligamentum flavum infolding. No canal or left foraminal stenosis. Mild to moderate right foraminal stenosis. L4-L5: Disc bulge. Moderate facet arthropathy with ligamentum flavum infolding. Minor canal and foraminal stenosis. L5-S1: Disc bulge. Moderate facet arthropathy with ligamentum flavum infolding. No canal or right foraminal stenosis. Minor left foraminal stenosis. IMPRESSION: Multilevel degenerative changes without high-grade stenosis. Electronically Signed   By: Macy Mis M.D.   On: 02/27/2020 15:08   DG Chest Port 1 View  Result Date: 03/23/2020 CLINICAL DATA:  Left-sided chest pain EXAM: PORTABLE CHEST 1 VIEW COMPARISON:  11/26/2018 FINDINGS: Cardiac shadow is stable. Aortic calcifications are seen. Lungs are  clear bilaterally. No bony abnormality  is seen. IMPRESSION: No acute abnormality noted. Electronically Signed   By: Inez Catalina M.D.   On: 03/23/2020 19:29    ECG & Cardiac Imaging    RSR, 75, LAE, ant infarct  - personally reviewed.  Assessment & Plan    1.  Non-STEMI/CAD: Patient with prior history of CAD status post RCA stenting x2 in 2008.  She subsequently had negative Myoview was in 2016 and most recently October 2019.  Over 3 nights preceding admission, she experienced nocturnal angina lasting up to 90 minutes, and resolving with nitroglycerin.  The worst episode occurred on Saturday evening.  She was seen in the office on January 31 and a troponin was evaluated and elevated at 1000.  In that setting, she was advised to present to the emergency department.  Troponin eventually rose to 1040 but is down to 648 this morning.  She has been on heparin overnight and is currently chest pain-free.  She is scheduled diagnostic catheterization this morning.  The patient understands that risks include but are not limited to stroke (1 in 1000), death (1 in 47), kidney failure [usually temporary] (1 in 500), bleeding (1 in 200), allergic reaction [possibly serious] (1 in 200), and agrees to proceed.  Continue aspirin, Plavix, statin, Zetia, heparin, and beta-blocker therapy.  2.  Essential hypertension: Blood pressure elevated this morning.  She says she did not sleep well in the emergency department last night and was very uncomfortable on the stretcher.  Continue beta-blocker and calcium channel blocker.  Hold ACE inhibitor for now in the setting of stage III kidney disease and pending catheterization.  Follow pressures and adjust medications as necessary.  3.  Hyperlipidemia: LDL of 76 in May 2021.  She is currently on rosuvastatin 10 mg and Zetia 10 mg.  Plan to follow-up lipids and adjust rosuvastatin if she can tolerate a higher dose.  Consider Vascepa for elevated triglycerides.  4.  Tobacco  abuse/COPD: Diminished breath sounds with scattered rhonchi and wheezing on examination.  Has been smoking 1 pack of cigarettes per day at home.  Complete cessation advised.  Inhaler therapy per internal medicine.  5.  Stage III chronic kidney disease.  Creatinine stable at 1.45 this morning.  Will hydrate pre and post cath.  6.  Type 2 diabetes mellitus: Insulin management per internal medicine.  Will hold ACE inhibitor given CKD 3 and pending cath/contrast.  Signed, Murray Hodgkins, NP 03/24/2020, 9:09 AM  For questions or updates, please contact   Please consult [email or web address] for contact info under Cardiology/STEMI.

## 2020-03-24 NOTE — Consult Note (Signed)
ANTICOAGULATION CONSULT NOTE - Initial Consult  Pharmacy Consult for Heparin Indication: chest pain/ACS  Allergies  Allergen Reactions  . Spironolactone Shortness Of Breath  . Sulfonamide Derivatives Other (See Comments)    Last taken as a child; made her mouth break out  . Other Other (See Comments)    Mouth blisters  . Sulfa Antibiotics Other (See Comments)    Mouth blisters Welts on mouth   . Codeine Nausea And Vomiting and Other (See Comments)    Nausea  . Prednisone Palpitations and Other (See Comments)    Made her legs "feel weird."    Patient Measurements: Height: 5\' 5"  (165.1 cm) Weight: 76.2 kg (168 lb) IBW/kg (Calculated) : 57 Heparin Dosing Weight: 72.6 kg   Vital Signs: Temp: 97.8 F (36.6 C) (01/31 1759) BP: 166/87 (02/01 0300) Pulse Rate: 65 (02/01 0300)  Labs: Recent Labs    03/23/20 1516 03/23/20 1800 03/23/20 2008 03/24/20 0333  HGB  --  17.0*  --   --   HCT  --  50.5*  --   --   PLT  --  221  --   --   APTT  --  31  --   --   LABPROT  --  12.4  --   --   INR  --  1.0  --   --   HEPARINUNFRC  --   --   --  0.38  CREATININE  --  1.45*  --   --   TROPONINIHS 1,000* 970* 1,040*  --     Estimated Creatinine Clearance: 35.3 mL/min (A) (by C-G formula based on SCr of 1.45 mg/dL (H)).   Medical History: Past Medical History:  Diagnosis Date  . Anemia   . Arthritis   . CAD (coronary artery disease)    a. cath 02/2006: BMS x 2 to RCA, cath o/w without significant coronary disease; b. nuclear stress test 07/2014: no signs of ischemia, no ekg changes concerning for ischemia, low risk study/normal study  . Chronic bronchitis ([place])    secondary to cigarette smoking  . Chronic kidney disease (CKD), stage III (moderate) (HCC)   . COPD (chronic obstructive pulmonary disease) ([place])   . Diabetes mellitus   . FHx: allergies   . GERD (gastroesophageal reflux disease)   . Goiter   . Granulomatous disease ([place])   . Hernia   . Hyperlipidemia   .  Hypertension   . Kidney stone on left side 2013  . Microalbuminuria   . Obesity   . Smokers' cough ([place])   . Spinal stenosis   . Stroke ([place]) 10/29/2016   mild left side weakness  . Stroke ([place]) 08/01/2019    Medications:  (Not in a hospital admission)  Scheduled:  . amLODipine  2.5 mg Oral Daily  . aspirin EC  81 mg Oral Daily  . clopidogrel  75 mg Oral Daily  . ezetimibe  10 mg Oral Daily  . fluticasone furoate-vilanterol  1 puff Inhalation Daily  . insulin aspart  0-15 Units Subcutaneous TID WC  . insulin aspart  0-5 Units Subcutaneous QHS  . insulin glargine  10 Units Subcutaneous QHS  . lisinopril  20 mg Oral Daily  . metoprolol tartrate  100 mg Oral BID  . rosuvastatin  10 mg Oral Daily  . sucralfate  1 g Oral BID  . tiotropium  18 mcg Inhalation Daily  . cyanocobalamin  1,000 mcg Oral Daily   Infusions:  . heparin 900 Units/hr (03/23/20 1919)  PRN:  Anti-infectives (From admission, onward)   None      Assessment: Pharmacy consulted to start heparin for ACS. Pt has c/o of left side chest discomfort. APTT and INR are ordered and troponin is in process. No note of DOAC PTA.   Goal of Therapy:  Heparin level 0.3-0.7 units/ml Monitor platelets by anticoagulation protocol: Yes   Plan:  0201:  HL @ 0333 = 0.38 Will continue pt on current rate and draw confirmation level in 8 hrs.   Kelci Petrella D, PharmD 03/24/2020,4:11 AM

## 2020-03-24 NOTE — ED Notes (Signed)
Pt resting comfortably with eyes closed, will continue to monitor.  

## 2020-03-24 NOTE — ED Notes (Signed)
Pt up to bathroom with assistance 

## 2020-03-25 ENCOUNTER — Inpatient Hospital Stay (HOSPITAL_COMMUNITY)
Admit: 2020-03-25 | Discharge: 2020-03-25 | Disposition: A | Discharge status: Home / Self Care | Payer: Medicare Other | Attending: Internal Medicine | Admitting: Internal Medicine

## 2020-03-25 ENCOUNTER — Encounter: Payer: Self-pay | Admitting: Internal Medicine

## 2020-03-25 DIAGNOSIS — I214 Non-ST elevation (NSTEMI) myocardial infarction: Secondary | ICD-10-CM

## 2020-03-25 LAB — BASIC METABOLIC PANEL
Anion gap: 9 (ref 5–15)
BUN: 14 mg/dL (ref 8–23)
CO2: 26 mmol/L (ref 22–32)
Calcium: 9 mg/dL (ref 8.9–10.3)
Chloride: 106 mmol/L (ref 98–111)
Creatinine, Ser: 1.04 mg/dL — ABNORMAL HIGH (ref 0.44–1.00)
GFR, Estimated: 57 mL/min — ABNORMAL LOW (ref >60)
Glucose, Bld: 152 mg/dL — ABNORMAL HIGH (ref 70–99)
Potassium: 3.6 mmol/L (ref 3.5–5.1)
Sodium: 141 mmol/L (ref 135–145)

## 2020-03-25 LAB — CBC
HCT: 46.1 % — ABNORMAL HIGH (ref 36.0–46.0)
Hemoglobin: 15.3 g/dL — ABNORMAL HIGH (ref 12.0–15.0)
MCH: 32.3 pg (ref 26.0–34.0)
MCHC: 33.2 g/dL (ref 30.0–36.0)
MCV: 97.3 fL (ref 80.0–100.0)
Platelets: 183 10*3/uL (ref 150–400)
RBC: 4.74 MIL/uL (ref 3.87–5.11)
RDW: 13.4 % (ref 11.5–15.5)
WBC: 9.8 10*3/uL (ref 4.0–10.5)
nRBC: 0 % (ref 0.0–0.2)

## 2020-03-25 LAB — ECHOCARDIOGRAM COMPLETE
AR max vel: 2.96 cm2
AV Area VTI: 4.3 cm2
AV Area mean vel: 2.8 cm2
AV Mean grad: 1.5 mmHg
AV Peak grad: 3.5 mmHg
Ao pk vel: 0.94 m/s
Area-P 1/2: 3.66 cm2
Height: 65 in
S' Lateral: 2.36 cm
Weight: 2688 oz

## 2020-03-25 LAB — GLUCOSE, CAPILLARY
Glucose-Capillary: 182 mg/dL — ABNORMAL HIGH (ref 70–99)
Glucose-Capillary: 138 mg/dL — ABNORMAL HIGH (ref 70–99)
Glucose-Capillary: 148 mg/dL — ABNORMAL HIGH (ref 70–99)

## 2020-03-25 MED ORDER — INSULIN ASPART 100 UNIT/ML ~~LOC~~ SOLN
SUBCUTANEOUS | Status: AC
  Administered 2020-03-25: 2 [IU] via SUBCUTANEOUS
  Filled 2020-03-25: qty 1

## 2020-03-25 MED ORDER — HEPARIN SODIUM (PORCINE) 5000 UNIT/ML IJ SOLN
INTRAMUSCULAR | Status: AC
  Administered 2020-03-25: 5000 [IU] via SUBCUTANEOUS
  Filled 2020-03-25: qty 1

## 2020-03-25 MED ORDER — METOPROLOL TARTRATE 50 MG PO TABS
ORAL_TABLET | ORAL | Status: AC
  Administered 2020-03-25: 100 mg via ORAL
  Filled 2020-03-25: qty 2

## 2020-03-25 NOTE — Progress Notes (Signed)
Progress Note  Patient Name: Hannah Kirby Date of Encounter: 03/25/2020  [facility] Cardiologist: Ida Rogue, MD   Subjective   Doing okay, denies chest pain or shortness of breath.  Would like to go home.  Denies any bleeding from access sites.  Inpatient Medications    Scheduled Meds: . amLODipine  5 mg Oral Daily  . aspirin EC  81 mg Oral Daily  . clopidogrel  75 mg Oral Q breakfast  . ezetimibe  10 mg Oral Daily  . fluticasone furoate-vilanterol  1 puff Inhalation Daily  . heparin  5,000 Units Subcutaneous Q8H  . insulin aspart  0-15 Units Subcutaneous TID WC  . insulin aspart  0-5 Units Subcutaneous QHS  . insulin glargine  10 Units Subcutaneous QHS  . lisinopril  20 mg Oral Daily  . metoprolol tartrate  100 mg Oral BID  . rosuvastatin  10 mg Oral Daily  . sodium chloride flush  3 mL Intravenous Q12H  . sodium chloride flush  3 mL Intravenous Q12H  . sucralfate  1 g Oral BID  . tiotropium  18 mcg Inhalation Daily  . cyanocobalamin  1,000 mcg Oral Daily   Continuous Infusions: . sodium chloride 10 mL/hr at 03/24/20 2308   PRN Meds: sodium chloride, acetaminophen, albuterol, HYDROcodone-acetaminophen, nitroGLYCERIN, ondansetron (ZOFRAN) IV, sodium chloride, sodium chloride flush   Vital Signs    Vitals:   03/25/20 0400 03/25/20 0635 03/25/20 0800 03/25/20 0809  BP: (!) 159/90 (!) 180/89 (!) 137/105 (!) 155/75  Pulse: 74 74 62 66  Resp: 19 20 18 19   Temp: 97.6 F (36.4 C)  97.8 F (36.6 C)   TempSrc: Temporal  Temporal   SpO2: 91% 94% 90% 93%  Weight:      Height:        Intake/Output Summary (Last 24 hours) at 03/25/2020 1059 Last data filed at 03/25/2020 0300 Gross per 24 hour  Intake 384.15 ml  Output 1900 ml  Net -1515.85 ml   Last 3 Weights 03/24/2020 03/24/2020 03/23/2020  Weight (lbs) 168 lb 167 lb 15.9 oz 168 lb  Weight (kg) 76.204 kg 76.2 kg 76.204 kg      Telemetry    Sinus rhythm- Personally Reviewed  ECG    No new tracing  noted- Personally Reviewed  Physical Exam   GEN: No acute distress.   Neck: No JVD Cardiac: RRR, no murmurs, rubs, or gallops.  Respiratory: Clear to auscultation bilaterally. GI: Soft, nontender, non-distended  MS: No edema; No deformity. Neuro:  Nonfocal  Psych: Normal affect   Labs    High Sensitivity Troponin:   Recent Labs  Lab 03/23/20 0043 03/23/20 1516 03/23/20 1800 03/23/20 2008 03/24/20 0333  TROPONINIHS 662* 1,000* 970* 1,040* 648*      Chemistry Recent Labs  Lab 03/23/20 1800 03/25/20 0354  NA 141 141  K 3.8 3.6  CL 102 106  CO2 26 26  GLUCOSE 197* 152*  BUN 18 14  CREATININE 1.45* 1.04*  CALCIUM 9.9 9.0  PROT 7.3  --   ALBUMIN 3.8  --   AST 34  --   ALT 19  --   ALKPHOS 86  --   BILITOT 0.6  --   GFRNONAA 38* 57*  ANIONGAP 13 9     Hematology Recent Labs  Lab 03/23/20 1800 03/24/20 0520 03/25/20 0354  WBC 10.1 9.8 9.8  RBC 5.22* 4.82 4.74  HGB 17.0* 15.7* 15.3*  HCT 50.5* 46.6* 46.1*  MCV 96.7 96.7 97.3  MCH 32.6 32.6 32.3  MCHC 33.7 33.7 33.2  RDW 13.3 13.4 13.4  PLT 221 205 183    BNPNo results for input(s): BNP, PROBNP in the last 168 hours.   DDimer No results for input(s): DDIMER in the last 168 hours.   Radiology    CARDIAC CATHETERIZATION  Result Date: 03/24/2020 Conclusions: 1. Severe single-vessel coronary artery disease with 99% distal RCA stenosis. 2. Mild, nonobstructive left coronary artery disease with minimal luminal irregularities involving the LAD and 20-30% ostial ramus stenosis. 3. Patent proximal/mid RCA stents with mild to moderate in-stent restenosis (~30%). 4. Mildly elevated left ventricular filling pressure (LVEDP 15-20 mmHg). 5. Successful PCI to distal RCA using Resolute Onyx 3.0 x 15 mm drug-eluting stent with 0% residual stenosis and TIMI-3 flow. 6. Right radial artery loop; consider alternative access for future catheterizations. Recommendations: 1. Remove right femoral artery sheath 2 hours after  completion of procedure. 2. Dual antiplatelet therapy with aspirin and clopidogrel for at least 12 months. 3. Aggressive secondary prevention. 4. Obtain echocardiogram. 5. Gentle post catheterization hydration in the setting of chronic kidney disease and mildly elevated left ventricular filling pressure. Nelva Bush, MD [facility]   DG Chest Port 1 View  Result Date: 03/23/2020 CLINICAL DATA:  Left-sided chest pain EXAM: PORTABLE CHEST 1 VIEW COMPARISON:  11/26/2018 FINDINGS: Cardiac shadow is stable. Aortic calcifications are seen. Lungs are clear bilaterally. No bony abnormality is seen. IMPRESSION: No acute abnormality noted. Electronically Signed   By: Inez Catalina M.D.   On: 03/23/2020 19:29   ECHOCARDIOGRAM COMPLETE  Result Date: 03/25/2020    ECHOCARDIOGRAM REPORT   Patient Name:   Hannah Kirby Pettinger Date of Exam: 03/25/2020 Medical Rec #:  [id number]        Height:       65.0 in Accession #:    [id number]       Weight:       168.0 lb Date of Birth:  [birth year]        BSA:          1.837 m Patient Age:    75 years         BP:           137/105 mmHg Patient Gender: F                HR:           62 bpm. Exam Location:  [facility] Procedure: 2D Echo, Cardiac Doppler and Color Doppler Indications:     Acute myocardial infarction I21.9  History:         Patient has prior history of Echocardiogram examinations, most                  recent 08/01/2019. CAD, COPD; Risk Factors:Hypertension. Tobacco                  abuse.  Sonographer:     Sherrie Sport RDCS (AE) Referring Phys:  [id number] CHRISTOPHER END Diagnosing Phys: Kate Sable MD  Sonographer Comments: Technically challenging study due to limited acoustic windows. Image acquisition challenging due to COPD. IMPRESSIONS  1. Left ventricular ejection fraction, by estimation, is 60 to 65%. The left ventricle has normal function. The left ventricle has no regional wall motion abnormalities. There is moderate left ventricular hypertrophy. Left ventricular  diastolic parameters are consistent with Grade I diastolic dysfunction (impaired relaxation).  2. Right ventricular systolic function is normal. The right ventricular size is normal.  3. The mitral  valve is normal in structure. No evidence of mitral valve regurgitation.  4. The aortic valve is tricuspid. Aortic valve regurgitation is not visualized. Mild aortic valve sclerosis is present, with no evidence of aortic valve stenosis.  5. The inferior vena cava is normal in size with greater than 50% respiratory variability, suggesting right atrial pressure of 3 mmHg. FINDINGS  Left Ventricle: Left ventricular ejection fraction, by estimation, is 60 to 65%. The left ventricle has normal function. The left ventricle has no regional wall motion abnormalities. The left ventricular internal cavity size was normal in size. There is  moderate left ventricular hypertrophy. Left ventricular diastolic parameters are consistent with Grade I diastolic dysfunction (impaired relaxation). Right Ventricle: The right ventricular size is normal. No increase in right ventricular wall thickness. Right ventricular systolic function is normal. Left Atrium: Left atrial size was normal in size. Right Atrium: Right atrial size was normal in size. Pericardium: There is no evidence of pericardial effusion. Mitral Valve: The mitral valve is normal in structure. No evidence of mitral valve regurgitation. Tricuspid Valve: The tricuspid valve is grossly normal. Tricuspid valve regurgitation is not demonstrated. Aortic Valve: The aortic valve is tricuspid. Aortic valve regurgitation is not visualized. Mild aortic valve sclerosis is present, with no evidence of aortic valve stenosis. Aortic valve mean gradient measures 1.5 mmHg. Aortic valve peak gradient measures 3.5 mmHg. Aortic valve area, by VTI measures 4.30 cm. Pulmonic Valve: The pulmonic valve was not well visualized. Pulmonic valve regurgitation is not visualized. Aorta: The aortic root is  normal in size and structure. Venous: The inferior vena cava is normal in size with greater than 50% respiratory variability, suggesting right atrial pressure of 3 mmHg. IAS/Shunts: No atrial level shunt detected by color flow Doppler.  LEFT VENTRICLE PLAX 2D LVIDd:         3.52 cm  Diastology LVIDs:         2.36 cm  LV e' medial:    4.13 cm/s LV PW:         1.33 cm  LV E/e' medial:  12.1 LV IVS:        1.10 cm  LV e' lateral:   4.57 cm/s LVOT diam:     2.10 cm  LV E/e' lateral: 11.0 LV SV:         58 LV SV Index:   31 LVOT Area:     3.46 cm  RIGHT VENTRICLE RV Basal diam:  3.15 cm RV S prime:     12.00 cm/s LEFT ATRIUM             Index       RIGHT ATRIUM           Index LA diam:        3.20 cm 1.74 cm/m  RA Area:     15.70 cm LA Vol (A2C):   40.1 ml 21.83 ml/m RA Volume:   42.80 ml  23.30 ml/m LA Vol (A4C):   27.2 ml 14.81 ml/m LA Biplane Vol: 35.2 ml 19.16 ml/m  AORTIC VALVE                   PULMONIC VALVE AV Area (Vmax):    2.96 cm    PV Vmax:        0.58 m/s AV Area (Vmean):   2.80 cm    PV Peak grad:   1.4 mmHg AV Area (VTI):     4.30 cm    RVOT Peak grad: 2  mmHg AV Vmax:           94.00 cm/s AV Vmean:          64.000 cm/s AV VTI:            0.134 m AV Peak Grad:      3.5 mmHg AV Mean Grad:      1.5 mmHg LVOT Vmax:         80.30 cm/s LVOT Vmean:        51.700 cm/s LVOT VTI:          0.167 m LVOT/AV VTI ratio: 1.24  AORTA Ao Root diam: 3.55 cm MITRAL VALVE               TRICUSPID VALVE MV Area (PHT): 3.66 cm    TR Peak grad:   28.9 mmHg MV Decel Time: 207 msec    TR Vmax:        269.00 cm/s MV E velocity: 50.10 cm/s MV A velocity: 88.30 cm/s  SHUNTS MV E/A ratio:  0.57        Systemic VTI:  0.17 m                            Systemic Diam: 2.10 cm Kate Sable MD Electronically signed by Kate Sable MD Signature Date/Time: 03/25/2020/10:37:04 AM    Final     Cardiac Studies   TTE 03/25/2020 1. Left ventricular ejection fraction, by estimation, is 60 to 65%. The  left ventricle has  normal function. The left ventricle has no regional  wall motion abnormalities. There is moderate left ventricular hypertrophy.  Left ventricular diastolic  parameters are consistent with Grade I diastolic dysfunction (impaired  relaxation).  2. Right ventricular systolic function is normal. The right ventricular  size is normal.  3. The mitral valve is normal in structure. No evidence of mitral valve  regurgitation.  4. The aortic valve is tricuspid. Aortic valve regurgitation is not  visualized. Mild aortic valve sclerosis is present, with no evidence of  aortic valve stenosis.  5. The inferior vena cava is normal in size with greater than 50%  respiratory variability, suggesting right atrial pressure of 3 mmHg.   Patient Profile     75 y.o. female with history of CAD/PCI to RCA 2008, presenting with chestpain and diagnosed with NSTEM. Underwent a LHC with significant distal RCA stenosis and PCI placed to the distal RCA.  Assessment & Plan    1. NSTEMI s/p pci to dRCA -Denies chest pain -Aspirin, Plavix for at least 12 months. -Crestor,zetia Echo reviewed. Normal function  2. htn -bp controlled -cont lisinopril. Diabetic patient   F/u in the office with Dr. [name] or APP in 2 weeks  Total encounter time 35 minutes  Greater than 50% was spent in counseling and coordination of care with the patient      Signed, Kate Sable, MD  03/25/2020, 10:59 AM

## 2020-03-25 NOTE — Discharge Summary (Signed)
Physician Discharge Summary  Hannah Kirby [id number] DOB: Sep 08, 1945 DOA: 03/23/2020  PCP: Birdie Sons, MD  Admit date: 03/23/2020 Discharge date: 03/25/2020  Admitted From: Home Disposition: Home  Recommendations for Outpatient Follow-up:  1. Follow up with PCP in 1-2 weeks 2. Follow-up with cardiology 3. Please obtain BMP/CBC in one week 4. Please follow up on the following pending results: None  Home Health: No Equipment/Devices: Rolling walker Discharge Condition: Stable CODE STATUS: Full Diet recommendation: Heart Healthy / Carb Modified   Brief/Interim Summary: Hannah Kirby a 75 y.o.femalewith medical history significant forCAD s/p stent to RCA 2008, hypertension, insulin-dependent type 2 diabetes, CKD 3A, CVA who presents with concerns of chest tightness and elevated troponin.  About 3 days ago patient started to notice that at night she would have anterior chest tightness and pressure. This would only happen at night around dinnertime and when she is doing some chores. No heavy lifting. Does not feel it during the day or afternoon. Took several doses of nitro over the weekend with relief. She also notes some associated shortness of breath when she goes to sleep but thinks is because she is having panic attacks. She denies any nausea or vomiting. She continues to be a heavy smoker using 1 pack of tobacco per day. She is compliant with her medications.  She presented to her cardiologist with the symptoms today and was found to have elevated troponin of the 1000 and EKG showing sinus rhythm with nonspecific ST changes.  Admitted for NSTEMI and was taken to cardiac Cath Lab, found to have 90% stenosis of RCA, successful PCI done. Patient tolerated the procedure well. No complications. Postoperative echocardiogram within normal limit. Patient is able to ambulate without any chest pain.  Her home dose of lisinopril was increased by her cardiologist and  she will continue with her beta-blocker and statin. She will also continue with DAPT for 1 year and follow-up with her cardiologist.  She will continue with her other medications and follow-up with her primary care provider for better control of diabetes to reduce her risk.  Discharge Diagnoses:  Principal Problem:   NSTEMI (non-ST elevated myocardial infarction) ([place]) Active Problems:   Tobacco use disorder   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[place])   Essential hypertension   Stage 3b chronic kidney disease ([place])   History of CVA (cerebrovascular accident)   Discharge Instructions  Discharge Instructions    AMB Referral to Cardiac Rehabilitation - Phase II   Complete by: As directed    Diagnosis:  Coronary Stents NSTEMI     After initial evaluation and assessments completed: Virtual Based Care may be provided alone or in conjunction with Phase 2 Cardiac Rehab based on patient barriers.: Yes   Diet - low sodium heart healthy   Complete by: As directed    Discharge instructions   Complete by: As directed    It was pleasure taking care of you. Continue taking aspirin and Plavix for at least 1 year and follow-up with your cardiologist closely for further management.   Increase activity slowly   Complete by: As directed      Allergies as of 03/25/2020      Reactions   Spironolactone Shortness Of Breath   Sulfonamide Derivatives Other (See Comments)   Last taken as a child; made her mouth break out   Other Other (See Comments)   Mouth blisters   Sulfa Antibiotics Other (See Comments)   Mouth blisters Welts on mouth   Codeine  Nausea And Vomiting, Other (See Comments)   Nausea   Prednisone Palpitations, Other (See Comments)   Made her legs "feel weird."      Medication List    TAKE these medications   acetaminophen 500 MG tablet Commonly known as: TYLENOL Take 500 mg by mouth every 6 (six) hours as needed.   albuterol 108 (90 Base) MCG/ACT inhaler Commonly  known as: Ventolin HFA Inhale 2 puffs into the lungs every 4 (four) hours as needed for wheezing or shortness of breath.   amLODipine 5 MG tablet Commonly known as: NORVASC Take 2.5 mg by mouth daily.   Aspirin 81 81 MG EC tablet Generic drug: aspirin Take 81 mg by mouth daily.   Breo Ellipta 100-25 MCG/INH Aepb Generic drug: fluticasone furoate-vilanterol Inhale 1 puff into the lungs daily.   clopidogrel 75 MG tablet Commonly known as: Plavix Take 1 tablet (75 mg total) by mouth daily.   cyanocobalamin 1000 MCG tablet Take 1,000 mcg by mouth daily.   Daily Multi Tabs Take 1 tablet by mouth daily.   ezetimibe 10 MG tablet Commonly known as: ZETIA Take 1 tablet (10 mg total) by mouth daily.   Fish Oil 1000 MG Caps Take 2 capsules by mouth in the morning and at bedtime.   HYDROcodone-acetaminophen 5-325 MG tablet Commonly known as: NORCO/VICODIN Take 1 tablet by mouth every 6 (six) hours as needed.   insulin glargine 100 UNIT/ML injection Commonly known as: LANTUS Inject 22 Units into the skin daily.   lisinopril 20 MG tablet Commonly known as: ZESTRIL Take 1 tablet (20 mg total) by mouth daily.   metoprolol tartrate 100 MG tablet Commonly known as: LOPRESSOR Take 1 tablet (100 mg total) by mouth 2 (two) times daily.   nitroGLYCERIN 0.4 MG SL tablet Commonly known as: Nitrostat Place 1 tablet (0.4 mg total) under the tongue every 5 (five) minutes as needed.   ondansetron 4 MG disintegrating tablet Commonly known as: Zofran ODT Take 1 tablet (4 mg total) by mouth every 8 (eight) hours as needed for nausea or vomiting.   RABEprazole 20 MG tablet Commonly known as: ACIPHEX Take 1 tablet (20 mg total) by mouth daily. 15 Mins. before evening meal   rosuvastatin 10 MG tablet Commonly known as: CRESTOR Take 1 tablet (10 mg total) by mouth daily.   Semaglutide(0.25 or 0.5MG /DOS) 2 MG/1.5ML Sopn Inject 0.5 mg into the skin once a week.   sucralfate 1 g  tablet Commonly known as: CARAFATE Take 1 tablet (1 g total) by mouth 2 (two) times daily. 15 Minutes before evening meal and at bed time   tiotropium 18 MCG inhalation capsule Commonly known as: SPIRIVA Place 1 capsule (18 mcg total) into inhaler and inhale daily.       Follow-up Information    Fisher, Kirstie Peri, MD. Schedule an appointment as soon as possible for a visit.   Specialty: Family Medicine Contact information: [address] [place] [address] [phone]        Minna Merritts, MD .   Specialty: Cardiology Contact information: [address] [place] [address] [phone]              Allergies  Allergen Reactions  . Spironolactone Shortness Of Breath  . Sulfonamide Derivatives Other (See Comments)    Last taken as a child; made her mouth break out  . Other Other (See Comments)    Mouth blisters  . Sulfa Antibiotics Other (See Comments)  Mouth blisters Welts on mouth   . Codeine Nausea And Vomiting and Other (See Comments)    Nausea  . Prednisone Palpitations and Other (See Comments)    Made her legs "feel weird."    Consultations:  Cardiology  Procedures/Studies: MR LUMBAR SPINE WO CONTRAST  Result Date: 02/27/2020 CLINICAL DATA:  Chronic low back pain radiating into right lower extremity EXAM: MRI LUMBAR SPINE WITHOUT CONTRAST TECHNIQUE: Multiplanar, multisequence MR imaging of the lumbar spine was performed. No intravenous contrast was administered. COMPARISON:  2014 FINDINGS: Segmentation: Transitional anatomy at the lumbosacral junction. For the purposes of this dictation, there is a partially lumbarized S1 with the L5-S1 disc space on series 8, image 26. Alignment: Levoscoliosis.  Anteroposterior alignment is maintained. Vertebrae: Vertebral body heights are preserved. Degenerative endplate irregularity is present at L3-L4. There is no substantial marrow edema. No suspicious osseous lesion. Conus  medullaris and cauda equina: Conus extends to the T12-L1 level. Conus and cauda equina appear normal. Paraspinal and other soft tissues: Bilateral renal cysts. Disc levels: L1-L2:  No canal or foraminal stenosis. L2-L3:  No canal or foraminal stenosis. L3-L4: Disc bulge with endplate osteophytic ridging eccentric to the right. Mild facet arthropathy with ligamentum flavum infolding. No canal or left foraminal stenosis. Mild to moderate right foraminal stenosis. L4-L5: Disc bulge. Moderate facet arthropathy with ligamentum flavum infolding. Minor canal and foraminal stenosis. L5-S1: Disc bulge. Moderate facet arthropathy with ligamentum flavum infolding. No canal or right foraminal stenosis. Minor left foraminal stenosis. IMPRESSION: Multilevel degenerative changes without high-grade stenosis. Electronically Signed   By: Macy Mis M.D.   On: 02/27/2020 15:08   CARDIAC CATHETERIZATION  Result Date: 03/24/2020 Conclusions: 1. Severe single-vessel coronary artery disease with 99% distal RCA stenosis. 2. Mild, nonobstructive left coronary artery disease with minimal luminal irregularities involving the LAD and 20-30% ostial ramus stenosis. 3. Patent proximal/mid RCA stents with mild to moderate in-stent restenosis (~30%). 4. Mildly elevated left ventricular filling pressure (LVEDP 15-20 mmHg). 5. Successful PCI to distal RCA using Resolute Onyx 3.0 x 15 mm drug-eluting stent with 0% residual stenosis and TIMI-3 flow. 6. Right radial artery loop; consider alternative access for future catheterizations. Recommendations: 1. Remove right femoral artery sheath 2 hours after completion of procedure. 2. Dual antiplatelet therapy with aspirin and clopidogrel for at least 12 months. 3. Aggressive secondary prevention. 4. Obtain echocardiogram. 5. Gentle post catheterization hydration in the setting of chronic kidney disease and mildly elevated left ventricular filling pressure. Nelva Bush, MD [facility]   DG  Chest Port 1 View  Result Date: 03/23/2020 CLINICAL DATA:  Left-sided chest pain EXAM: PORTABLE CHEST 1 VIEW COMPARISON:  11/26/2018 FINDINGS: Cardiac shadow is stable. Aortic calcifications are seen. Lungs are clear bilaterally. No bony abnormality is seen. IMPRESSION: No acute abnormality noted. Electronically Signed   By: Inez Catalina M.D.   On: 03/23/2020 19:29   ECHOCARDIOGRAM COMPLETE  Result Date: 03/25/2020    ECHOCARDIOGRAM REPORT   Patient Name:   Hannah Kirby Date of Exam: 03/25/2020 Medical Rec #:  [id number]        Height:       65.0 in Accession #:    [id number]       Weight:       168.0 lb Date of Birth:  09-02-1945        BSA:          1.837 m Patient Age:    39 years         BP:  137/105 mmHg Patient Gender: F                HR:           62 bpm. Exam Location:  [facility] Procedure: 2D Echo, Cardiac Doppler and Color Doppler Indications:     Acute myocardial infarction I21.9  History:         Patient has prior history of Echocardiogram examinations, most                  recent 08/01/2019. CAD, COPD; Risk Factors:Hypertension. Tobacco                  abuse.  Sonographer:     Sherrie Sport RDCS (AE) Referring Phys:  [id number] CHRISTOPHER END Diagnosing Phys: Kate Sable MD  Sonographer Comments: Technically challenging study due to limited acoustic windows. Image acquisition challenging due to COPD. IMPRESSIONS  1. Left ventricular ejection fraction, by estimation, is 60 to 65%. The left ventricle has normal function. The left ventricle has no regional wall motion abnormalities. There is moderate left ventricular hypertrophy. Left ventricular diastolic parameters are consistent with Grade I diastolic dysfunction (impaired relaxation).  2. Right ventricular systolic function is normal. The right ventricular size is normal.  3. The mitral valve is normal in structure. No evidence of mitral valve regurgitation.  4. The aortic valve is tricuspid. Aortic valve regurgitation is not visualized. Mild  aortic valve sclerosis is present, with no evidence of aortic valve stenosis.  5. The inferior vena cava is normal in size with greater than 50% respiratory variability, suggesting right atrial pressure of 3 mmHg. FINDINGS  Left Ventricle: Left ventricular ejection fraction, by estimation, is 60 to 65%. The left ventricle has normal function. The left ventricle has no regional wall motion abnormalities. The left ventricular internal cavity size was normal in size. There is  moderate left ventricular hypertrophy. Left ventricular diastolic parameters are consistent with Grade I diastolic dysfunction (impaired relaxation). Right Ventricle: The right ventricular size is normal. No increase in right ventricular wall thickness. Right ventricular systolic function is normal. Left Atrium: Left atrial size was normal in size. Right Atrium: Right atrial size was normal in size. Pericardium: There is no evidence of pericardial effusion. Mitral Valve: The mitral valve is normal in structure. No evidence of mitral valve regurgitation. Tricuspid Valve: The tricuspid valve is grossly normal. Tricuspid valve regurgitation is not demonstrated. Aortic Valve: The aortic valve is tricuspid. Aortic valve regurgitation is not visualized. Mild aortic valve sclerosis is present, with no evidence of aortic valve stenosis. Aortic valve mean gradient measures 1.5 mmHg. Aortic valve peak gradient measures 3.5 mmHg. Aortic valve area, by VTI measures 4.30 cm. Pulmonic Valve: The pulmonic valve was not well visualized. Pulmonic valve regurgitation is not visualized. Aorta: The aortic root is normal in size and structure. Venous: The inferior vena cava is normal in size with greater than 50% respiratory variability, suggesting right atrial pressure of 3 mmHg. IAS/Shunts: No atrial level shunt detected by color flow Doppler.  LEFT VENTRICLE PLAX 2D LVIDd:         3.52 cm  Diastology LVIDs:         2.36 cm  LV e' medial:    4.13 cm/s LV PW:          1.33 cm  LV E/e' medial:  12.1 LV IVS:        1.10 cm  LV e' lateral:   4.57 cm/s LVOT diam:  2.10 cm  LV E/e' lateral: 11.0 LV SV:         58 LV SV Index:   31 LVOT Area:     3.46 cm  RIGHT VENTRICLE RV Basal diam:  3.15 cm RV S prime:     12.00 cm/s LEFT ATRIUM             Index       RIGHT ATRIUM           Index LA diam:        3.20 cm 1.74 cm/m  RA Area:     15.70 cm LA Vol (A2C):   40.1 ml 21.83 ml/m RA Volume:   42.80 ml  23.30 ml/m LA Vol (A4C):   27.2 ml 14.81 ml/m LA Biplane Vol: 35.2 ml 19.16 ml/m  AORTIC VALVE                   PULMONIC VALVE AV Area (Vmax):    2.96 cm    PV Vmax:        0.58 m/s AV Area (Vmean):   2.80 cm    PV Peak grad:   1.4 mmHg AV Area (VTI):     4.30 cm    RVOT Peak grad: 2 mmHg AV Vmax:           94.00 cm/s AV Vmean:          64.000 cm/s AV VTI:            0.134 m AV Peak Grad:      3.5 mmHg AV Mean Grad:      1.5 mmHg LVOT Vmax:         80.30 cm/s LVOT Vmean:        51.700 cm/s LVOT VTI:          0.167 m LVOT/AV VTI ratio: 1.24  AORTA Ao Root diam: 3.55 cm MITRAL VALVE               TRICUSPID VALVE MV Area (PHT): 3.66 cm    TR Peak grad:   28.9 mmHg MV Decel Time: 207 msec    TR Vmax:        269.00 cm/s MV E velocity: 50.10 cm/s MV A velocity: 88.30 cm/s  SHUNTS MV E/A ratio:  0.57        Systemic VTI:  0.17 m                            Systemic Diam: 2.10 cm Kate Sable MD Electronically signed by Kate Sable MD Signature Date/Time: 03/25/2020/10:37:04 AM    Final      Subjective: Patient was feeling better when seen today. No chest pain or shortness of breath. She wants to go home.  Discharge Exam: Vitals:   03/25/20 0800 03/25/20 0809  BP: (!) 137/105 (!) 155/75  Pulse: 62 66  Resp: 18 19  Temp: 97.8 F (36.6 C)   SpO2: 90% 93%   Vitals:   03/25/20 0400 03/25/20 0635 03/25/20 0800 03/25/20 0809  BP: (!) 159/90 (!) 180/89 (!) 137/105 (!) 155/75  Pulse: 74 74 62 66  Resp: 19 20 18 19   Temp: 97.6 F (36.4 C)  97.8 F (36.6 C)    TempSrc: Temporal  Temporal   SpO2: 91% 94% 90% 93%  Weight:      Height:        General: Pt is alert, awake, not in acute  distress Cardiovascular: RRR, S1/S2 +, no rubs, no gallops Respiratory: CTA bilaterally, no wheezing, no rhonchi Abdominal: Soft, NT, ND, bowel sounds + Extremities: no edema, no cyanosis   The results of significant diagnostics from this hospitalization (including imaging, microbiology, ancillary and laboratory) are listed below for reference.    Microbiology: Recent Results (from the past 240 hour(s))  SARS CORONAVIRUS 2 (TAT 6-24 HRS) Nasopharyngeal Nasopharyngeal Swab     Status: None   Collection Time: 03/23/20  6:25 PM   Specimen: Nasopharyngeal Swab  Result Value Ref Range Status   SARS Coronavirus 2 NEGATIVE NEGATIVE Final    Comment: (NOTE) SARS-CoV-2 target nucleic acids are NOT DETECTED.  The SARS-CoV-2 RNA is generally detectable in upper and lower respiratory specimens during the acute phase of infection. Negative results do not preclude SARS-CoV-2 infection, do not rule out co-infections with other pathogens, and should not be used as the sole basis for treatment or other patient management decisions. Negative results must be combined with clinical observations, patient history, and epidemiological information. The expected result is Negative.  Fact Sheet for Patients: SugarRoll.be  Fact Sheet for Healthcare Providers: https://www.woods-mathews.com/  This test is not yet approved or cleared by the [place] FDA and  has been authorized for detection and/or diagnosis of SARS-CoV-2 by FDA under an Emergency Use Authorization (EUA). This EUA will remain  in effect (meaning this test can be used) for the duration of the COVID-19 declaration under Se ction 564(b)(1) of the Act, 21 U.S.C. section 360bbb-3(b)(1), unless the authorization is terminated or revoked sooner.  Performed at [facility], [place] [address] [place], [place] [address]      Labs: BNP (last 3 results) Recent Labs    11/20/19 1039  BNP [id number]*   Basic Metabolic Panel: Recent Labs  Lab 03/23/20 1800 03/25/20 0354  NA 141 141  K 3.8 3.6  CL 102 106  CO2 26 26  GLUCOSE 197* 152*  BUN 18 14  CREATININE 1.45* 1.04*  CALCIUM 9.9 9.0   Liver Function Tests: Recent Labs  Lab 03/23/20 1800  AST 34  ALT 19  ALKPHOS 86  BILITOT 0.6  PROT 7.3  ALBUMIN 3.8   No results for input(s): LIPASE, AMYLASE in the last 168 hours. No results for input(s): AMMONIA in the last 168 hours. CBC: Recent Labs  Lab 03/23/20 1800 03/24/20 0520 03/25/20 0354  WBC 10.1 9.8 9.8  HGB 17.0* 15.7* 15.3*  HCT 50.5* 46.6* 46.1*  MCV 96.7 96.7 97.3  PLT 221 205 183   Cardiac Enzymes: No results for input(s): CKTOTAL, CKMB, CKMBINDEX, TROPONINI in the last 168 hours. BNP: Invalid input(s): POCBNP CBG: Recent Labs  Lab 03/24/20 0757 03/24/20 1223 03/24/20 1659 03/24/20 2143 03/25/20 0729  GLUCAP 135* 112* 182* 169* 138*   D-Dimer No results for input(s): DDIMER in the last 72 hours. Hgb A1c No results for input(s): HGBA1C in the last 72 hours. Lipid Profile Recent Labs    03/24/20 0333  CHOL 147  HDL 42  LDLCALC 70  TRIG 177*  CHOLHDL 3.5   Thyroid function studies No results for input(s): TSH, T4TOTAL, T3FREE, THYROIDAB in the last 72 hours.  Invalid input(s): FREET3 Anemia work up No results for input(s): VITAMINB12, FOLATE, FERRITIN, TIBC, IRON, RETICCTPCT in the last 72 hours. Urinalysis    Component Value Date/Time   COLORURINE YELLOW (A) 08/07/2019 2228   APPEARANCEUR HAZY (A) 08/07/2019 2228   LABSPEC 1.022 08/07/2019 2228   PHURINE 5.0 08/07/2019  [place] 08/07/2019 2228   HGBUR SMALL (A) 08/07/2019 2228   BILIRUBINUR NEGATIVE 08/07/2019 2228   KETONESUR NEGATIVE 08/07/2019 2228   PROTEINUR >=300 (A) 08/07/2019 2228   NITRITE NEGATIVE 08/07/2019 2228    LEUKOCYTESUR NEGATIVE 08/07/2019 2228   Sepsis Labs Invalid input(s): PROCALCITONIN,  WBC,  LACTICIDVEN Microbiology Recent Results (from the past 240 hour(s))  SARS CORONAVIRUS 2 (TAT 6-24 HRS) Nasopharyngeal Nasopharyngeal Swab     Status: None   Collection Time: 03/23/20  6:25 PM   Specimen: Nasopharyngeal Swab  Result Value Ref Range Status   SARS Coronavirus 2 NEGATIVE NEGATIVE Final    Comment: (NOTE) SARS-CoV-2 target nucleic acids are NOT DETECTED.  The SARS-CoV-2 RNA is generally detectable in upper and lower respiratory specimens during the acute phase of infection. Negative results do not preclude SARS-CoV-2 infection, do not rule out co-infections with other pathogens, and should not be used as the sole basis for treatment or other patient management decisions. Negative results must be combined with clinical observations, patient history, and epidemiological information. The expected result is Negative.  Fact Sheet for Patients: SugarRoll.be  Fact Sheet for Healthcare Providers: https://www.woods-mathews.com/  This test is not yet approved or cleared by the [place] FDA and  has been authorized for detection and/or diagnosis of SARS-CoV-2 by FDA under an Emergency Use Authorization (EUA). This EUA will remain  in effect (meaning this test can be used) for the duration of the COVID-19 declaration under Se ction 564(b)(1) of the Act, 21 U.S.C. section 360bbb-3(b)(1), unless the authorization is terminated or revoked sooner.  Performed at [facility], [place] [address], [place], [place] [address]     Time coordinating discharge: Over 30 minutes  SIGNED:  Lorella Nimrod, MD  [facility] 03/25/2020, 11:34 AM  If 7PM-7AM, please contact night-coverage [email or web address]  This record has been created using Systems analyst. Errors have been sought and corrected,but may not always be located. Such  creation errors do not reflect on the standard of care.

## 2020-03-25 NOTE — Progress Notes (Signed)
*  PRELIMINARY RESULTS* Echocardiogram 2D Echocardiogram has been performed.  Sherrie Sport 03/25/2020, 9:39 AM

## 2020-03-25 NOTE — Progress Notes (Signed)
Nsg Discharge Note  Admit Date:  03/23/2020 Discharge date: 03/25/2020   Hannah Kirby to be D/C'd Home per MD order.  AVS completed.  Patient/caregiver able to verbalize understanding.  Discharge Medication: Allergies as of 03/25/2020      Reactions   Spironolactone Shortness Of Breath   Sulfonamide Derivatives Other (See Comments)   Last taken as a child; made her mouth break out   Other Other (See Comments)   Mouth blisters   Sulfa Antibiotics Other (See Comments)   Mouth blisters Welts on mouth   Codeine Nausea And Vomiting, Other (See Comments)   Nausea   Prednisone Palpitations, Other (See Comments)   Made her legs "feel weird."      Medication List    TAKE these medications   acetaminophen 500 MG tablet Commonly known as: TYLENOL Take 500 mg by mouth every 6 (six) hours as needed.   albuterol 108 (90 Base) MCG/ACT inhaler Commonly known as: Ventolin HFA Inhale 2 puffs into the lungs every 4 (four) hours as needed for wheezing or shortness of breath.   amLODipine 5 MG tablet Commonly known as: NORVASC Take 2.5 mg by mouth daily.   Aspirin 81 81 MG EC tablet Generic drug: aspirin Take 81 mg by mouth daily.   Breo Ellipta 100-25 MCG/INH Aepb Generic drug: fluticasone furoate-vilanterol Inhale 1 puff into the lungs daily.   clopidogrel 75 MG tablet Commonly known as: Plavix Take 1 tablet (75 mg total) by mouth daily.   cyanocobalamin 1000 MCG tablet Take 1,000 mcg by mouth daily.   Daily Multi Tabs Take 1 tablet by mouth daily.   ezetimibe 10 MG tablet Commonly known as: ZETIA Take 1 tablet (10 mg total) by mouth daily.   Fish Oil 1000 MG Caps Take 2 capsules by mouth in the morning and at bedtime.   HYDROcodone-acetaminophen 5-325 MG tablet Commonly known as: NORCO/VICODIN Take 1 tablet by mouth every 6 (six) hours as needed.   insulin glargine 100 UNIT/ML injection Commonly known as: LANTUS Inject 22 Units into the skin daily.   lisinopril 20  MG tablet Commonly known as: ZESTRIL Take 1 tablet (20 mg total) by mouth daily.   metoprolol tartrate 100 MG tablet Commonly known as: LOPRESSOR Take 1 tablet (100 mg total) by mouth 2 (two) times daily.   nitroGLYCERIN 0.4 MG SL tablet Commonly known as: Nitrostat Place 1 tablet (0.4 mg total) under the tongue every 5 (five) minutes as needed.   ondansetron 4 MG disintegrating tablet Commonly known as: Zofran ODT Take 1 tablet (4 mg total) by mouth every 8 (eight) hours as needed for nausea or vomiting.   RABEprazole 20 MG tablet Commonly known as: ACIPHEX Take 1 tablet (20 mg total) by mouth daily. 15 Mins. before evening meal   rosuvastatin 10 MG tablet Commonly known as: CRESTOR Take 1 tablet (10 mg total) by mouth daily.   Semaglutide(0.25 or 0.5MG /DOS) 2 MG/1.5ML Sopn Inject 0.5 mg into the skin once a week.   sucralfate 1 g tablet Commonly known as: CARAFATE Take 1 tablet (1 g total) by mouth 2 (two) times daily. 15 Minutes before evening meal and at bed time   tiotropium 18 MCG inhalation capsule Commonly known as: SPIRIVA Place 1 capsule (18 mcg total) into inhaler and inhale daily.       Discharge Assessment: Vitals:   03/25/20 0809 03/25/20 1200  BP: (!) 155/75 (!) 155/79  Pulse: 66 70  Resp: 19 20  Temp:  98.3 F (  36.8 C)  SpO2: 93% (!) 89%   Skin clean, dry and intact without evidence of skin break down, no evidence of skin tears noted. IV catheter discontinued intact. Site without signs and symptoms of complications - no redness or edema noted at insertion site, patient denies c/o pain - only slight tenderness at site.  Dressing with slight pressure applied.  D/c Instructions-Education: Discharge instructions given to patient/family with verbalized understanding. D/c education completed with patient/family including follow up instructions, medication list, d/c activities limitations if indicated, with other d/c instructions as indicated by MD -  patient able to verbalize understanding, all questions fully answered. Patient instructed to return to ED, call 911, or call MD for any changes in condition.  Patient escorted via [place], and D/C home via private auto.  [name], RN 03/25/2020 12:52 PM

## 2020-03-25 NOTE — Progress Notes (Signed)
TOC consult regarding husband's care needs while patient is in hospital. Confirmed with [facility] Supervisor, patient's family will need to arrange care for her husband while patient is hospitalized. If concerns continue, staff or family can call Adult Protective Services.  Oleh Genin, [place]

## 2020-03-26 ENCOUNTER — Telehealth: Payer: Self-pay

## 2020-03-26 DIAGNOSIS — F17200 Nicotine dependence, unspecified, uncomplicated: Secondary | ICD-10-CM

## 2020-03-26 DIAGNOSIS — W109XXD Fall (on) (from) unspecified stairs and steps, subsequent encounter: Secondary | ICD-10-CM | POA: Diagnosis not present

## 2020-03-26 DIAGNOSIS — R2689 Other abnormalities of gait and mobility: Secondary | ICD-10-CM | POA: Diagnosis not present

## 2020-03-26 DIAGNOSIS — E1122 Type 2 diabetes mellitus with diabetic chronic kidney disease: Secondary | ICD-10-CM | POA: Diagnosis not present

## 2020-03-26 DIAGNOSIS — I214 Non-ST elevation (NSTEMI) myocardial infarction: Secondary | ICD-10-CM

## 2020-03-26 DIAGNOSIS — N189 Chronic kidney disease, unspecified: Secondary | ICD-10-CM | POA: Diagnosis not present

## 2020-03-26 DIAGNOSIS — I129 Hypertensive chronic kidney disease with stage 1 through stage 4 chronic kidney disease, or unspecified chronic kidney disease: Secondary | ICD-10-CM | POA: Diagnosis not present

## 2020-03-26 DIAGNOSIS — G459 Transient cerebral ischemic attack, unspecified: Secondary | ICD-10-CM

## 2020-03-26 DIAGNOSIS — Z742 Need for assistance at home and no other household member able to render care: Secondary | ICD-10-CM

## 2020-03-26 DIAGNOSIS — M75111 Incomplete rotator cuff tear or rupture of right shoulder, not specified as traumatic: Secondary | ICD-10-CM | POA: Diagnosis not present

## 2020-03-26 NOTE — Telephone Encounter (Signed)
She can be worked in to the [facility] on Tuesday the 8th.  Hematoma is normal and should put on ice back on it every few hours. It should not drain and cause only mid discomfort.

## 2020-03-26 NOTE — Telephone Encounter (Signed)
Transition Care Management Follow-up Telephone Call  Date of discharge and from where: [facility] on 03/25/20  How have you been since you were released from the hospital? Doing better and slept well last night. Pt has experienced some episodes of being jittery when she gets up and moves around but once she sits down and rests, it goes away. Pt believes this is due to being weak. Pt has a hematoma above the incision site in her groin. Pt states it looks like a blood pocket and it is not hard, open or causing any issues. Declines fever, pain, SOB, dizziness, light headed or n/v/d.  Any questions or concerns? Yes, pt is concerned about the hematoma that has formed above the incision site and if that is normal.   Items Reviewed:  Did the pt receive and understand the discharge instructions provided? Yes   Medications obtained and verified? No, no new medication changes.  Any new allergies since your discharge? No   Dietary orders reviewed? Yes  Do you have support at home? Yes   Other (ie: DME, Home Health, etc): D/c with a spirometer to use.  Functional Questionnaire: (I = Independent and D = Dependent)  Bathing/Dressing- I   Meal Prep- I  Eating- I  Maintaining continence- I  Transferring/Ambulation- I  Managing Meds- I   Follow up appointments reviewed:    PCP Hospital f/u appt confirmed? No , no availability for a HFU in the next 2 weeks. Message send to PCP.  [facility] f/u appt confirmed? Yes    Are transportation arrangements needed? No   If their condition worsens, is the pt aware to call  their PCP or go to the ED? Yes  Was the patient provided with contact information for the PCP's office or ED? Yes  Was the pt encouraged to call back with questions or concerns? Yes

## 2020-03-26 NOTE — Telephone Encounter (Signed)
HFU not scheduled at this time. 

## 2020-03-27 ENCOUNTER — Telehealth: Payer: Self-pay | Admitting: *Deleted

## 2020-03-27 ENCOUNTER — Telehealth: Payer: Self-pay | Admitting: Family Medicine

## 2020-03-27 DIAGNOSIS — R2689 Other abnormalities of gait and mobility: Secondary | ICD-10-CM | POA: Diagnosis not present

## 2020-03-27 DIAGNOSIS — M75111 Incomplete rotator cuff tear or rupture of right shoulder, not specified as traumatic: Secondary | ICD-10-CM | POA: Diagnosis not present

## 2020-03-27 DIAGNOSIS — I129 Hypertensive chronic kidney disease with stage 1 through stage 4 chronic kidney disease, or unspecified chronic kidney disease: Secondary | ICD-10-CM | POA: Diagnosis not present

## 2020-03-27 DIAGNOSIS — N189 Chronic kidney disease, unspecified: Secondary | ICD-10-CM | POA: Diagnosis not present

## 2020-03-27 DIAGNOSIS — E1122 Type 2 diabetes mellitus with diabetic chronic kidney disease: Secondary | ICD-10-CM | POA: Diagnosis not present

## 2020-03-27 DIAGNOSIS — W109XXD Fall (on) (from) unspecified stairs and steps, subsequent encounter: Secondary | ICD-10-CM | POA: Diagnosis not present

## 2020-03-27 NOTE — Telephone Encounter (Signed)
Patient advised of appointment time and date. I also advised her of the message below from Dr. Caryn Section. She verbalized understanding. She reports that there is no pain or drainage, and the hematoma has decreased in size.

## 2020-03-27 NOTE — Chronic Care Management (AMB) (Signed)
  Chronic Care Management   Note  03/27/2020 Name: Hannah Kirby MRN: [id number] DOB: January 17, 1946  Hannah Kirby is a 75 y.o. year old female who is a primary care patient of Caryn Section, Kirstie Peri, MD. I reached out to Roland Earl Schwier by phone today in response to a referral sent by Ms. Roland Earl Braithwaite's PCP,  Birdie Sons, MD.     Hannah Kirby was given information about Chronic Care Management services today including:  1. CCM service includes personalized support from designated clinical staff supervised by her physician, including individualized plan of care and coordination with other care providers 2. 24/7 contact phone numbers for assistance for urgent and routine care needs. 3. Service will only be billed when office clinical staff spend 20 minutes or more in a month to coordinate care. 4. Only one practitioner may furnish and bill the service in a calendar month. 5. The patient may stop CCM services at any time (effective at the end of the month) by phone call to the office staff. 6. The patient will be responsible for cost sharing (co-pay) of up to 20% of the service fee (after annual deductible is met).  Patient agreed to services and verbal consent obtained.   Follow up plan: Telephone appointment with care management team member scheduled for:04/01/2020  Kelci Petrella  Care Guide, Embedded Care Coordination [place]  Care Management

## 2020-03-27 NOTE — Telephone Encounter (Signed)
   Telephone encounter was:  Successful.  03/27/2020 Name: ROSMARIE ESQUIBEL MRN: [id number] DOB: [birth year]  Roland Earl Leano is a 75 y.o. year old female who is a primary care patient of Fisher, Kirstie Peri, MD . The community resource team was consulted for assistance with Transportation Needs   Care guide performed the following interventions: Investigation of community resources performed Discussed resources to assist with transportation. Patient said that her husband drives and can take her to her appointment. She did say that she needs information for later on in case they both can't drive. I told her that I would call and place a referral to [facility] so that she can get all the benefits that her husband can get besides just the [place]. Marland Kitchen  Follow Up Plan:  No further follow up planned at this time. The patient has been provided with needed resources.  [place], Care Management Phone: [phone] Email: [email or web address]

## 2020-03-30 DIAGNOSIS — N189 Chronic kidney disease, unspecified: Secondary | ICD-10-CM | POA: Diagnosis not present

## 2020-03-30 DIAGNOSIS — W109XXD Fall (on) (from) unspecified stairs and steps, subsequent encounter: Secondary | ICD-10-CM | POA: Diagnosis not present

## 2020-03-30 DIAGNOSIS — E1122 Type 2 diabetes mellitus with diabetic chronic kidney disease: Secondary | ICD-10-CM | POA: Diagnosis not present

## 2020-03-30 DIAGNOSIS — R2689 Other abnormalities of gait and mobility: Secondary | ICD-10-CM | POA: Diagnosis not present

## 2020-03-30 DIAGNOSIS — M75111 Incomplete rotator cuff tear or rupture of right shoulder, not specified as traumatic: Secondary | ICD-10-CM | POA: Diagnosis not present

## 2020-03-30 DIAGNOSIS — I129 Hypertensive chronic kidney disease with stage 1 through stage 4 chronic kidney disease, or unspecified chronic kidney disease: Secondary | ICD-10-CM | POA: Diagnosis not present

## 2020-03-31 ENCOUNTER — Ambulatory Visit (INDEPENDENT_AMBULATORY_CARE_PROVIDER_SITE_OTHER): Payer: Medicare Other | Admitting: Family Medicine

## 2020-03-31 ENCOUNTER — Other Ambulatory Visit: Payer: Self-pay

## 2020-03-31 ENCOUNTER — Encounter: Payer: Self-pay | Admitting: Family Medicine

## 2020-03-31 VITALS — BP 131/85 | HR 72 | Resp 16 | Wt 171.0 lb

## 2020-03-31 DIAGNOSIS — I25118 Atherosclerotic heart disease of native coronary artery with other forms of angina pectoris: Secondary | ICD-10-CM | POA: Diagnosis not present

## 2020-03-31 DIAGNOSIS — R2689 Other abnormalities of gait and mobility: Secondary | ICD-10-CM | POA: Diagnosis not present

## 2020-03-31 DIAGNOSIS — E1122 Type 2 diabetes mellitus with diabetic chronic kidney disease: Secondary | ICD-10-CM | POA: Diagnosis not present

## 2020-03-31 DIAGNOSIS — F1190 Opioid use, unspecified, uncomplicated: Secondary | ICD-10-CM | POA: Diagnosis not present

## 2020-03-31 DIAGNOSIS — M48061 Spinal stenosis, lumbar region without neurogenic claudication: Secondary | ICD-10-CM | POA: Diagnosis not present

## 2020-03-31 DIAGNOSIS — W109XXD Fall (on) (from) unspecified stairs and steps, subsequent encounter: Secondary | ICD-10-CM | POA: Diagnosis not present

## 2020-03-31 DIAGNOSIS — M75111 Incomplete rotator cuff tear or rupture of right shoulder, not specified as traumatic: Secondary | ICD-10-CM | POA: Diagnosis not present

## 2020-03-31 DIAGNOSIS — N189 Chronic kidney disease, unspecified: Secondary | ICD-10-CM | POA: Diagnosis not present

## 2020-03-31 DIAGNOSIS — I129 Hypertensive chronic kidney disease with stage 1 through stage 4 chronic kidney disease, or unspecified chronic kidney disease: Secondary | ICD-10-CM | POA: Diagnosis not present

## 2020-03-31 MED ORDER — HYDROCODONE-ACETAMINOPHEN 5-325 MG PO TABS: 1.0000 | ORAL_TABLET | Freq: Four times a day (QID) | ORAL | 0 refills | Status: DC | PRN

## 2020-03-31 NOTE — Patient Instructions (Addendum)
Please review the attached list of medications and notify my office if there are any errors.  ? ?Please call the [facility] ([phone]) to schedule a routine screening mammogram. ? ?

## 2020-03-31 NOTE — Progress Notes (Signed)
Established patient visit   Patient: Hannah Kirby   DOB: 09-07-1945   75 y.o. Female  MRN: [id number] Visit Date: 03/31/2020  Today's healthcare provider: Lelon Huh, MD   Chief Complaint  Patient presents with  . Hospitalization Follow-up   Subjective    HPI  Follow up Hospitalization  Patient was admitted to [facility] on 03/23/2020 and discharged on 03/25/2020. She was treated for NSTEMI and chest pressure.    Treatment for this included increasing home dose of lisinopril. She     was to continue with her beta-blocker and statin. She will also        continue with DAPT for 1 year and follow-up with her cardiologist..       Patient was advised to follow up with PCP and   Cardiology to obtain    BMP/CBC in one week. She has follow up cardiology scheduled tomorrow. She has had no chest pain, pressure, dyspnea or palpitations since discharge. Is somewhat fatigued. She did have a substantial groin hematoma which has now gone down, but no other abnormal bleeding or bruising.   Telephone follow up was done on 03/26/2020. She reports good compliance with treatment. She reports this condition is improved. Patient is unsure which dose of Lisinopril she is taking    ----------------------------------------------------------------------------------------- -      Medications: Outpatient Medications Prior to Visit  Medication Sig  . acetaminophen (TYLENOL) 500 MG tablet Take 500 mg by mouth every 6 (six) hours as needed.  Marland Kitchen albuterol (VENTOLIN HFA) 108 (90 Base) MCG/ACT inhaler Inhale 2 puffs into the lungs every 4 (four) hours as needed for wheezing or shortness of breath.  Marland Kitchen amLODipine (NORVASC) 5 MG tablet Take 2.5 mg by mouth daily.  Marland Kitchen aspirin (ASPIR-81) 81 MG EC tablet Take 81 mg by mouth daily.  . clopidogrel (PLAVIX) 75 MG tablet Take 1 tablet (75 mg total) by mouth daily.  . cyanocobalamin 1000 MCG tablet Take 1,000 mcg by mouth daily.  Marland Kitchen ezetimibe (ZETIA) 10 MG tablet  Take 1 tablet (10 mg total) by mouth daily.  . fluticasone furoate-vilanterol (BREO ELLIPTA) 100-25 MCG/INH AEPB Inhale 1 puff into the lungs daily.  Marland Kitchen HYDROcodone-acetaminophen (NORCO/VICODIN) 5-325 MG tablet Take 1 tablet by mouth every 6 (six) hours as needed.  . insulin glargine (LANTUS) 100 UNIT/ML injection Inject 22 Units into the skin daily.   Marland Kitchen lisinopril (ZESTRIL) 20 MG tablet Take 1 tablet (20 mg total) by mouth daily.  . metoprolol tartrate (LOPRESSOR) 100 MG tablet Take 1 tablet (100 mg total) by mouth 2 (two) times daily.  . Multiple Vitamins-Minerals (DAILY MULTI) TABS Take 1 tablet by mouth daily.  . nitroGLYCERIN (NITROSTAT) 0.4 MG SL tablet Place 1 tablet (0.4 mg total) under the tongue every 5 (five) minutes as needed.  . Omega-3 Fatty Acids (FISH OIL) 1000 MG CAPS Take 2 capsules by mouth in the morning and at bedtime.   . ondansetron (ZOFRAN ODT) 4 MG disintegrating tablet Take 1 tablet (4 mg total) by mouth every 8 (eight) hours as needed for nausea or vomiting.  . RABEprazole (ACIPHEX) 20 MG tablet Take 1 tablet (20 mg total) by mouth daily. 15 Mins. before evening meal  . rosuvastatin (CRESTOR) 10 MG tablet Take 1 tablet (10 mg total) by mouth daily.  . Semaglutide,0.25 or 0.5MG /DOS, 2 MG/1.5ML SOPN Inject 0.5 mg into the skin once a week.  . sucralfate (CARAFATE) 1 g tablet Take 1 tablet (1 g total) by mouth  2 (two) times daily. 15 Minutes before evening meal and at bed time  . tiotropium (SPIRIVA) 18 MCG inhalation capsule Place 1 capsule (18 mcg total) into inhaler and inhale daily.   No facility-administered medications prior to visit.    Review of Systems  Constitutional: Negative for appetite change, chills, fatigue and fever.  Respiratory: Positive for cough. Negative for chest tightness and shortness of breath.   Cardiovascular: Negative for chest pain and palpitations.  Gastrointestinal: Negative for abdominal pain, nausea and vomiting.  Neurological: Negative  for dizziness and weakness.       Objective    BP 131/85 (BP Location: Left Arm, Patient Position: Sitting, Cuff Size: Large)   Pulse 72   Resp 16   Wt 171 lb (77.6 kg)   BMI 28.46 kg/m     Physical Exam    General: Appearance:     Overweight female in no acute distress  Eyes:    PERRL, conjunctiva/corneas clear, EOM's intact       Lungs:     Clear to auscultation bilaterally, respirations unlabored  Heart:    Normal heart rate. Normal rhythm. No murmurs, rubs, or gallops.   MS:   All extremities are intact.   Neurologic:   Awake, alert, oriented x 3. No apparent focal neurological           defect.         Assessment & Plan     1. Atherosclerosis of native coronary artery of native heart with stable angina pectoris [facility]) S/p PCI doing well post procedure. Needs DUAP therapy until February 2023. Check labs today. Follow up cardiology tomorrow as scheduled.  - CBC - Renal function panel  Follow up here 3-4 months.   2. Spinal stenosis of lumbar region, unspecified whether neurogenic claudication present refill - HYDROcodone-acetaminophen (NORCO/VICODIN) 5-325 MG tablet; Take 1 tablet by mouth every 6 (six) hours as needed.  Dispense: 120 tablet; Refill: 0  3. Chronic, continuous use of opioids  - HYDROcodone-acetaminophen (NORCO/VICODIN) 5-325 MG tablet; Take 1 tablet by mouth every 6 (six) hours as needed.  Dispense: 120 tablet; Refill: 0       The entirety of the information documented in the History of Present Illness, Review of Systems and Physical Exam were personally obtained by me. Portions of this information were initially documented by the CMA and reviewed by me for thoroughness and accuracy.      Lelon Huh, MD  [facility] [phone] (phone) [phone] (fax)  [place]

## 2020-03-31 NOTE — Progress Notes (Signed)
Office Visit    Patient Name: Hannah Kirby Date of Encounter: 04/01/2020  Primary Care Provider:  Birdie Sons, MD Primary Cardiologist:  Ida Rogue, MD  Chief Complaint    75 y/o female with hx of CAD s/p RCA stenting x 2 in 2008, HTN, hyperlipidemia, COPD, tobacco abuse, stroke, anxiety, CKD 3b, and PVCs who presents today for hospital follow-up s/p PCI DES x 1 to RCA.   Past Medical History    Past Medical History:  Diagnosis Date  . Anemia   . Anxiety   . Arthritis   . CAD (coronary artery disease)    a. 02/2006 PCI: BMS x 2 to RCA, cath o/w without significant coronary disease; b. nuclear stress test 07/2014: No ischemia/infarct; c. 11/2017 MV: no isch/infarct, EF 55-65%; d. 03/2020 NSTEMI/PCI: LM nl, LAD min irregs, RI 25, small, LCX nl, RCA 30p/m ISR, 99d (3.0x15 Resolute Onyx DES).  . Chronic bronchitis ([place])    secondary to cigarette smoking  . Chronic kidney disease (CKD), stage III (moderate) (HCC)   . COPD (chronic obstructive pulmonary disease) ([place])   . Diabetes mellitus   . Diastolic dysfunction    a. 07/2019 Echo: EF 60-65%, no rwma, mod LVH. Nl RV size/fxn; b. 03/2020 Echo: EF 60-65%, mod LVH, gr1 DD, nl RV size/fxn, mild AS.  Marland Kitchen FHx: allergies   . GERD (gastroesophageal reflux disease)   . Goiter   . Granulomatous disease ([place])   . Hernia   . Hyperlipidemia   . Hypertension   . Kidney stone on left side 2013  . Microalbuminuria   . Obesity   . Panic attacks   . PVC's (premature ventricular contractions)    a. 03/2018 Zio: Occas PVCs (2.5%). Triggered events assoc w/ PVC/PAC.  Marland Kitchen Retinal tear 2020  . Smokers' cough ([place])   . Spinal stenosis   . Stroke ([place]) 10/29/2016   mild left side weakness  . Stroke ([place]) 08/01/2019  . Tobacco abuse    Past Surgical History:  Procedure Laterality Date  . ABDOMINAL HYSTERECTOMY    . BREAST SURGERY    . CATARACT EXTRACTION W/PHACO Right 03/01/2017   Procedure: CATARACT EXTRACTION PHACO AND INTRAOCULAR  LENS PLACEMENT ([place]) RIGHT DIABETIC;  Surgeon: Leandrew Koyanagi, MD;  Location: [place];  Service: Ophthalmology;  Laterality: Right;  . CATARACT EXTRACTION W/PHACO Left 03/22/2017   Procedure: CATARACT EXTRACTION PHACO AND INTRAOCULAR LENS PLACEMENT ([place]) LEFT DIABETIC;  Surgeon: Leandrew Koyanagi, MD;  Location: [place];  Service: Ophthalmology;  Laterality: Left;  Diabetic - insulin and oral meds  . COLONOSCOPY WITH PROPOFOL N/A 09/23/2014   Procedure: COLONOSCOPY WITH PROPOFOL;  Surgeon: Lollie Sails, MD;  Location: [facility] ENDOSCOPY;  Service: Endoscopy;  Laterality: N/A;  . COLONOSCOPY WITH PROPOFOL N/A 01/11/2018   Procedure: COLONOSCOPY WITH PROPOFOL;  Surgeon: Lollie Sails, MD;  Location: [facility] ENDOSCOPY;  Service: Endoscopy;  Laterality: N/A;  . COLONOSCOPY WITH PROPOFOL N/A 04/24/2018   Procedure: COLONOSCOPY WITH PROPOFOL;  Surgeon: Lollie Sails, MD;  Location: [facility] ENDOSCOPY;  Service: Endoscopy;  Laterality: N/A;  . COLONOSCOPY WITH PROPOFOL N/A 11/19/2019   Procedure: COLONOSCOPY WITH PROPOFOL;  Surgeon: Lucilla Lame, MD;  Location: [facility] ENDOSCOPY;  Service: Endoscopy;  Laterality: N/A;  . CORONARY ANGIOPLASTY WITH STENT PLACEMENT  2008  . CORONARY STENT INTERVENTION N/A 03/24/2020   Procedure: CORONARY STENT INTERVENTION;  Surgeon: Nelva Bush, MD;  Location: [place] CV LAB;  Service: Cardiovascular;  Laterality: N/A;  . ESOPHAGOGASTRODUODENOSCOPY (EGD)  WITH PROPOFOL N/A 12/30/2014   Procedure: ESOPHAGOGASTRODUODENOSCOPY (EGD) WITH PROPOFOL;  Surgeon: Lollie Sails, MD;  Location: [facility]COPY;  Service: Endoscopy;  Laterality: N/A;  . ESOPHAGOGASTRODUODENOSCOPY (EGD) WITH PROPOFOL N/A 07/19/2016   Procedure: ESOPHAGOGASTRODUODENOSCOPY (EGD) WITH PROPOFOL;  Surgeon: Lollie Sails, MD;  Location[facility] ENDOSCOPY;  Service: Endoscopy;  Laterality: N/A;  . ESOPHAGOGASTRODUODENOSCOPY (EGD) WITH PROPOFOL N/A 04/24/2018   Procedure:  ESOPHAGOGASTRODUODENOSCOPY (EGD) WITH PROPOFOL;  Surgeon: Lollie Sails, MD;  Location: [facility] ENDOSCOPY;  Service: Endoscopy;  Laterality: N/A;  . hysterectomy (other)    . LEFT HEART CATH AND CORONARY ANGIOGRAPHY N/A 03/24/2020   Procedure: LEFT HEART CATH AND CORONARY ANGIOGRAPHY;  Surgeon: Nelva Bush, MD;  Location: [place] CV LAB;  Service: Cardiovascular;  Laterality: N/A;    Allergies  Allergies  Allergen Reactions  . Spironolactone Shortness Of Breath  . Sulfonamide Derivatives Other (See Comments)    Last taken as a child; made her mouth break out  . Other Other (See Comments)    Mouth blisters  . Sulfa Antibiotics Other (See Comments)    Mouth blisters Welts on mouth   . Codeine Nausea And Vomiting and Other (See Comments)    Nausea  . Prednisone Palpitations and Other (See Comments)    Made her legs "feel weird."    History of Present Illness    75 y/o female with hx of CAD s/p RCA stenting x 2 in 2008, HTN, hyperlipidemia, COPD, tobacco abuse, stroke, anxiety, CKD 3b, PVCs who presents today for hospital follow-up s/p PCI DES x 1 to RCA. She presented to our office on 1/31 following 4 days of intermittent chest pressure relieved with ntg. She was chest pain free at the office but her troponin was returned at 1000 and she was called by Dr. [name] and advised to go to the ED for admission. Upon admission for NSTEMI, left heart cath was planned.   On 2/1 she underwent left heart cath which showed 99% distal RCA stenosis, mild non-obstructive left coronary artery disease w/20-30% ostial ramus stenosis, patent prox/mid RCA stents w/mild to mod in-stent restenosis (~30%), mildly elev LVEDP 15-20 mmHg, successful PCI to distal RCA w/Resolute Onyx 3.0 x 15 mm DES. The remainder of her hospitalization was uneventful and she was d/c'ed on 2/2.   Today she reports that she has had no chest discomfort since d/c. She reports she has DOE with minimal exertion and her husband  has had to help her bathe, dress, and cook whereas previously she did not require his assistance for those activities. I asked about her breathing in relation to COPD prior to admission and she reports that these symptoms are stable and felt to be r/t to COPD.   Home Medications    Prior to Admission medications   Medication Sig Start Date End Date Taking? Authorizing Provider  acetaminophen (TYLENOL) 500 MG tablet Take 500 mg by mouth every 6 (six) hours as needed.    [provider]  albuterol (VENTOLIN HFA) 108 (90 Base) MCG/ACT inhaler Inhale 2 puffs into the lungs every 4 (four) hours as needed for wheezing or shortness of breath. 09/21/17   Birdie Sons, MD  amLODipine (NORVASC) 5 MG tablet Take 2.5 mg by mouth daily.    [provider]  aspirin (ASPIR-81) 81 MG EC tablet Take 81 mg by mouth daily.    [provider]  clopidogrel (PLAVIX) 75 MG tablet Take 1 tablet (75 mg total) by mouth daily. 11/06/19  Minna Merritts, MD  cyanocobalamin 1000 MCG tablet Take 1,000 mcg by mouth daily.    [provider]  ezetimibe (ZETIA) 10 MG tablet Take 1 tablet (10 mg total) by mouth daily. 11/06/19   Minna Merritts, MD  fluticasone furoate-vilanterol (BREO ELLIPTA) 100-25 MCG/INH AEPB Inhale 1 puff into the lungs daily. 11/20/19   Birdie Sons, MD  HYDROcodone-acetaminophen (NORCO/VICODIN) 5-325 MG tablet Take 1 tablet by mouth every 6 (six) hours as needed. 03/31/20   Birdie Sons, MD  insulin glargine (LANTUS) 100 UNIT/ML injection Inject 22 Units into the skin daily.     [provider]  lisinopril (ZESTRIL) 20 MG tablet Take 1 tablet (20 mg total) by mouth daily. 11/06/19   Minna Merritts, MD  metoprolol tartrate (LOPRESSOR) 100 MG tablet Take 1 tablet (100 mg total) by mouth 2 (two) times daily. 11/06/19   Minna Merritts, MD  Multiple Vitamins-Minerals (DAILY MULTI) TABS Take 1 tablet by mouth daily.    [provider]   nitroGLYCERIN (NITROSTAT) 0.4 MG SL tablet Place 1 tablet (0.4 mg total) under the tongue every 5 (five) minutes as needed. 03/23/20   Minna Merritts, MD  Omega-3 Fatty Acids (FISH OIL) 1000 MG CAPS Take 2 capsules by mouth in the morning and at bedtime.     [provider]  ondansetron (ZOFRAN ODT) 4 MG disintegrating tablet Take 1 tablet (4 mg total) by mouth every 8 (eight) hours as needed for nausea or vomiting. 08/06/16   Alfred Levins, [place], MD  RABEprazole (ACIPHEX) 20 MG tablet Take 1 tablet (20 mg total) by mouth daily. 15 Mins. before evening meal 10/23/19   Lucilla Lame, MD  rosuvastatin (CRESTOR) 10 MG tablet Take 1 tablet (10 mg total) by mouth daily. 11/06/19   Minna Merritts, MD  Semaglutide,0.25 or 0.5MG /DOS, 2 MG/1.5ML SOPN Inject 0.5 mg into the skin once a week. 01/13/20   [provider]  sucralfate (CARAFATE) 1 g tablet Take 1 tablet (1 g total) by mouth 2 (two) times daily. 15 Minutes before evening meal and at bed time 10/23/19   Lucilla Lame, MD  tiotropium (SPIRIVA) 18 MCG inhalation capsule Place 1 capsule (18 mcg total) into inhaler and inhale daily. 11/20/19   Birdie Sons, MD    Review of Systems   Patient denies chest discomfort, edema, syncope, dizziness, lightheadedness, palpitations. ++ DOE which she had prior to admission but has worsened since d/c.  All other systems reviewed and are otherwise negative except as noted above.  Physical Exam    Today's Vitals   04/01/20 1314  BP: 128/80  Pulse: 79  Weight: 166 lb (75.3 kg)  Height: 5\' 5"  (1.651 m)   Body mass index is 27.62 kg/m. GEN: Well developed, obese, in no acute distress. HEENT: normal. Neck: Supple, no JVD, carotid bruits, or masses. Cardiac: RRR, no murmurs, rubs, or gallops. No clubbing, cyanosis, edema.  Radials/PT 2+ and equal bilaterally. Right groin cath site w/o swelling, hematoma. Bandage is still intact and is dry - removed today. There is moderate bruising to the area  appropriate for stage of healing. No bruit.  Respiratory:  Respirations regular and unlabored, breath sounds diminished to auscultation bilaterally. GI: Soft, nontender, nondistended, BS + x 4. MS: no deformity or atrophy. Skin: warm and dry, no rash.  Neuro:  Strength and sensation are intact. Psych: Normal affect.  Accessory Clinical Findings    ECG personally reviewed by me today - RSR @  72 bpm - no acute changes.  Lab Results  Component Value Date   WBC 9.0 03/31/2020   HGB 16.4 (H) 03/31/2020   HCT 47.7 (H) 03/31/2020   MCV 94 03/31/2020   PLT 232 03/31/2020   Lab Results  Component Value Date   CREATININE 1.15 (H) 03/31/2020   BUN 16 03/31/2020   NA 141 03/31/2020   K 4.0 03/31/2020   CL 103 03/31/2020   CO2 23 03/31/2020   Lab Results  Component Value Date   ALT 19 03/23/2020   AST 34 03/23/2020   ALKPHOS 86 03/23/2020   BILITOT 0.6 03/23/2020   Lab Results  Component Value Date   CHOL 147 03/24/2020   HDL 42 03/24/2020   LDLCALC 70 03/24/2020   TRIG 177 (H) 03/24/2020   CHOLHDL 3.5 03/24/2020    Lab Results  Component Value Date   HGBA1C 8.2 (A) 11/20/2019    Assessment & Plan    1.  CAD s/p NSTEMI and DES to RCA x 1: Presented to ED 1/31 after having elev troponin of 1000 at our office same day. This visit was scheduled by pt following 4 days of intermittent chest pressure relieved with ntg. Pt was chest pain free in the office. Past hx of RCA stenting x 2 in 2008 (bare metal). LHC 2/1 revealed distal RCA lesion w/99% stenosis w/successful PCI DES X1, mild to mod in-stent restenosis (~30%), mildly elev LVEDP 15-20 mmHg, and mild, non-obstructive left coronary artery dz w/min luminal irregularities involving LAD & 20-30% ostial ramus stenosis. Patient reports recovery has been limited somewhat by continued DOE which she attributes to COPD. We discussed cath and echo results @ length today and offered reassurance. Euvolemic on exam.  DAPT ASA/plavix x 12  months. Continue plavix indefinitely due to bare metal stents 2008 and prior strokes. She states she is tolerating medications without adverse effect. Continue  blocker, statin, ezetimibe, CCB, ntg prn.   2. Essential HTN: BP elevated upon arrival (142/92), improved to 128/80 at recheck. She reports normal BP readings at home ranging from systolic 893-810, diastolic 17-51 mmHg. BP stable. She increased her amlodipine to 5 mg daily at home. Continue CCB,  blocker, ACE-I. Creatinine stable @ 1.15 on 2/8. Will continue to follow.   3. Tobacco abuse/COPD: Patient continues to smoke 1 ppd. Her copd is managed by PCP. She reports she has been having worsening DOE x approx 3 months. She reports compliance w/ inhalers, has been on this regimen of Breo & Spiriva w/ventolin rescue inhaler for at least 6 mo she reports. Offered option of referral to pulm but she declines. Complete smoking cessation advised. Management per PCP.   4. Hyperlipidemia: LDL 70, at goal. She currently takes rosuvastatin 10 mg and Zetia 10 mg. She does not recall whether she has tried higher dose rosuva in the past. Triglycerides elevated, can consider addition of Vascepa at next visit.   5. CKD 3b. Creatinine stable at 1.15 ordered by PCP 2/8. Continue ACE-I.   6. T2DM: A1C 7.1 in 5/21. Management per PCP.   7. Disposition: Continue current medications. Return in 1 month for follow-up.   Murray Hodgkins, NP 04/01/2020, 5:10 PM

## 2020-04-01 ENCOUNTER — Other Ambulatory Visit: Payer: Self-pay

## 2020-04-01 ENCOUNTER — Ambulatory Visit (INDEPENDENT_AMBULATORY_CARE_PROVIDER_SITE_OTHER): Payer: Medicare Other

## 2020-04-01 ENCOUNTER — Encounter: Payer: Self-pay | Admitting: Family Medicine

## 2020-04-01 ENCOUNTER — Ambulatory Visit (INDEPENDENT_AMBULATORY_CARE_PROVIDER_SITE_OTHER): Payer: Medicare Other | Admitting: Nurse Practitioner

## 2020-04-01 ENCOUNTER — Encounter: Payer: Self-pay | Admitting: Nurse Practitioner

## 2020-04-01 VITALS — BP 128/80 | HR 79 | Ht 65.0 in | Wt 166.0 lb

## 2020-04-01 DIAGNOSIS — I5032 Chronic diastolic (congestive) heart failure: Secondary | ICD-10-CM | POA: Diagnosis not present

## 2020-04-01 DIAGNOSIS — E785 Hyperlipidemia, unspecified: Secondary | ICD-10-CM | POA: Diagnosis not present

## 2020-04-01 DIAGNOSIS — Z720 Tobacco use: Secondary | ICD-10-CM

## 2020-04-01 DIAGNOSIS — I2510 Atherosclerotic heart disease of native coronary artery without angina pectoris: Secondary | ICD-10-CM | POA: Diagnosis not present

## 2020-04-01 DIAGNOSIS — I10 Essential (primary) hypertension: Secondary | ICD-10-CM | POA: Diagnosis not present

## 2020-04-01 DIAGNOSIS — E0821 Diabetes mellitus due to underlying condition with diabetic nephropathy: Secondary | ICD-10-CM

## 2020-04-01 DIAGNOSIS — I214 Non-ST elevation (NSTEMI) myocardial infarction: Secondary | ICD-10-CM | POA: Diagnosis not present

## 2020-04-01 LAB — RENAL FUNCTION PANEL
Albumin: 4.1 g/dL (ref 3.7–4.7)
BUN/Creatinine Ratio: 14 (ref 12–28)
BUN: 16 mg/dL (ref 8–27)
CO2: 23 mmol/L (ref 20–29)
Calcium: 10.5 mg/dL — ABNORMAL HIGH (ref 8.7–10.3)
Chloride: 103 mmol/L (ref 96–106)
Creatinine, Ser: 1.15 mg/dL — ABNORMAL HIGH (ref 0.57–1.00)
GFR calc Af Amer: 55 mL/min/{1.73_m2} — ABNORMAL LOW (ref >59)
GFR calc non Af Amer: 47 mL/min/{1.73_m2} — ABNORMAL LOW (ref >59)
Glucose: 93 mg/dL (ref 65–99)
Phosphorus: 3.8 mg/dL (ref 3.0–4.3)
Potassium: 4 mmol/L (ref 3.5–5.2)
Sodium: 141 mmol/L (ref 134–144)

## 2020-04-01 LAB — CBC
Hematocrit: 47.7 % — ABNORMAL HIGH (ref 34.0–46.6)
Hemoglobin: 16.4 g/dL — ABNORMAL HIGH (ref 11.1–15.9)
MCH: 32.2 pg (ref 26.6–33.0)
MCHC: 34.4 g/dL (ref 31.5–35.7)
MCV: 94 fL (ref 79–97)
Platelets: 232 10*3/uL (ref 150–450)
RBC: 5.09 x10E6/uL (ref 3.77–5.28)
RDW: 12.5 % (ref 11.7–15.4)
WBC: 9 10*3/uL (ref 3.4–10.8)

## 2020-04-01 NOTE — Chronic Care Management (AMB) (Signed)
Chronic Care Management   Initial Visit Note  04/01/2020 Name: Hannah Kirby MRN: [id number] DOB: 1945-11-20  Primary Care Provider: Birdie Sons, MD Reason for referral : Chronic Care Management  Hannah Kirby is a 75 y.o. year old female who is a primary care patient of Fisher, Kirstie Peri, MD. The CCM team was consulted for assistance with chronic disease management and care coordination.  Review of Mrs. Fanton' status, including review of consultants reports, relevant labs and test results was conducted today. Collaboration with appropriate care team members was performed as part of the comprehensive evaluation and provision of chronic care management services.     SDOH (Social Determinants of Health) assessments performed: Yes See Care Plan activities for detailed interventions related to SDOH  SDOH Interventions   Flowsheet Row Most Recent Value  SDOH Interventions   Food Insecurity Interventions Intervention Not Indicated  Transportation Interventions Other (Comment)  [Completed outreach with [facility] Care Guide regarding transportation resources.]  Alcohol Brief Interventions/Follow-up AUDIT Score <7 follow-up not indicated      Medications: Outpatient Encounter Medications as of 04/01/2020  Medication Sig  . acetaminophen (TYLENOL) 500 MG tablet Take 500 mg by mouth every 6 (six) hours as needed.  Marland Kitchen albuterol (VENTOLIN HFA) 108 (90 Base) MCG/ACT inhaler Inhale 2 puffs into the lungs every 4 (four) hours as needed for wheezing or shortness of breath.  Marland Kitchen amLODipine (NORVASC) 5 MG tablet Take 5 mg by mouth daily.  Marland Kitchen aspirin (ASPIR-81) 81 MG EC tablet Take 81 mg by mouth daily.  . clopidogrel (PLAVIX) 75 MG tablet Take 1 tablet (75 mg total) by mouth daily.  . cyanocobalamin 1000 MCG tablet Take 1,000 mcg by mouth daily.  Marland Kitchen ezetimibe (ZETIA) 10 MG tablet Take 1 tablet (10 mg total) by mouth daily.  . fluticasone furoate-vilanterol (BREO ELLIPTA) 100-25 MCG/INH AEPB Inhale 1  puff into the lungs daily.  Marland Kitchen HYDROcodone-acetaminophen (NORCO/VICODIN) 5-325 MG tablet Take 1 tablet by mouth every 6 (six) hours as needed.  . insulin glargine (LANTUS) 100 UNIT/ML injection Inject 22 Units into the skin daily.   Marland Kitchen lisinopril (ZESTRIL) 20 MG tablet Take 1 tablet (20 mg total) by mouth daily.  . metoprolol tartrate (LOPRESSOR) 100 MG tablet Take 1 tablet (100 mg total) by mouth 2 (two) times daily.  . Multiple Vitamins-Minerals (DAILY MULTI) TABS Take 1 tablet by mouth daily.  . nitroGLYCERIN (NITROSTAT) 0.4 MG SL tablet Place 1 tablet (0.4 mg total) under the tongue every 5 (five) minutes as needed.  . Omega-3 Fatty Acids (FISH OIL) 1000 MG CAPS Take 2 capsules by mouth in the morning and at bedtime.   . ondansetron (ZOFRAN ODT) 4 MG disintegrating tablet Take 1 tablet (4 mg total) by mouth every 8 (eight) hours as needed for nausea or vomiting.  . RABEprazole (ACIPHEX) 20 MG tablet Take 1 tablet (20 mg total) by mouth daily. 15 Mins. before evening meal  . rosuvastatin (CRESTOR) 10 MG tablet Take 1 tablet (10 mg total) by mouth daily.  . Semaglutide,0.25 or 0.5MG/DOS, 2 MG/1.5ML SOPN Inject 0.5 mg into the skin once a week.  . sucralfate (CARAFATE) 1 g tablet Take 1 tablet (1 g total) by mouth 2 (two) times daily. 15 Minutes before evening meal and at bed time  . tiotropium (SPIRIVA) 18 MCG inhalation capsule Place 1 capsule (18 mcg total) into inhaler and inhale daily.   No facility-administered encounter medications on file as of 04/01/2020.     Objective:  Fall  Risk  04/01/2020 02/18/2020 11/20/2019 08/16/2019 08/04/2017  Falls in the past year? 1 1 0 0 No  Number falls in past yr: 0 0 - 0 -  Injury with Fall? 1 1 - 0 -  Risk for fall due to : History of fall(s);Medication side effect - - - -  Follow up Falls prevention discussed Falls prevention discussed - Falls evaluation completed -      Patient Care Plan: Heart Failure (Adult)    Problem Identified: Symptom  Exacerbation (Heart Failure)     Long-Range Goal: Symptom Exacerbation Prevented or Minimized   Start Date: 04/01/2020  Expected End Date: 07/30/2020  Priority: High  Note:   Current Barriers:  . Chronic Disease Management needs and support r/t Heart Failure  Nurse Case Manager Clinical Goal(s):  Over the next 120 days, patient will not require hospitalization or emergent care d/t complications r/t Heart Failure exacerbation.  Interventions:  . Collaboration with Birdie Sons, MD regarding development and update of comprehensive plan of care as evidenced by provider attestation and co-signature . Inter-disciplinary care team collaboration (see longitudinal plan of care) . Reviewed medications. Encouraged to take as prescribed. Encouraged to notify Cardiologist or PCP if unable to tolerate prescribed cardiac regimen. . Discussed basic overview of Heart Failure. . Discussed importance of weighing at least weekly and recording readings. Instructed to notify provider if weight increases >3 lbs overnight or > 5 lbs in a week. . Discussed s/sx of exacerbation along with indications for seeking immediate medical attention.  Patient Goals/Self-Care Activities Over the next 120 days, patient will:  - Complete Cardiology follow-up - Take medications as prescribed - Monitor and record weight.  - Notify Cardiologist or PCP for weight gain outside of established parameters. - Adhere to a low sodium diet and follow Heart failure plan as advised by the Cardiology team.   Follow Up Plan:  Will follow up within 30 days.   Patient Care Plan: Diabetes Type 2 (Adult)    Problem Identified: Glycemic Management (Diabetes, Type 2)     Long-Range Goal: Glycemic Management Optimized   Start Date: 04/01/2020  Expected End Date: 07/30/2020  Priority: High  Note:   Objective:  Lab Results  Component Value Date   HGBA1C 8.2 (A) 11/20/2019 .   Lab Results  Component Value Date   CREATININE 1.15 (H)  03/31/2020   CREATININE 1.04 (H) 03/25/2020   CREATININE 1.45 (H) 03/23/2020 .   Marland Kitchen No results found for: EGFR  Current Barriers:  . Chronic Disease Management needs and support related to Diabetes self-management   Case Manager Clinical Goal(s):  Over the next 120 days, patient will: Marland Kitchen Demonstrate adherence to prescribed treatment plan for Diabetes self management as evidenced by daily monitoring and recording of CBG, adherence to ADA/ carb modified diet and adherence to prescribed medication regimen.  Interventions:  . Collaboration with Birdie Sons, MD regarding development and update of comprehensive plan of care as evidenced by provider attestation and co-signature . Inter-disciplinary care team collaboration (see longitudinal plan of care) . Reviewed medications. Encouraged to take medications as prescribed and notify team with concerns regarding prescription cost. . Provided information regarding importance of consistent blood glucose monitoring. Encouraged to monitor and maintain a log. . Reviewed s/sx of hypoglycemia and hyperglycemia along with recommended interventions. . Discussed nutritional intake and importance of completing recommended DM preventive care.   Patient Goals/Self-Care Activities Over the next 120 days, patient will:  - Self administer oral medications and insulin  as prescribed - Attend all scheduled provider appointments - Monitor blood glucose levels consistently and utilize recommended interventions - Adhere to prescribed ADA diet.   Follow Up Plan:  Will follow-up within 30 days       Patient Care Plan: Hypertension (Adult)    Problem Identified: Hypertension (Hypertension)     Long-Range Goal: Hypertension Monitored   Start Date: 04/01/2020  Expected End Date: 07/30/2020  Priority: High  Note:   Objective:  . Last practice recorded BP readings:  BP Readings from Last 3 Encounters:  04/01/20 128/80  03/31/20 131/85  03/25/20 (!) 146/81  .   Marland Kitchen Most recent eGFR/CrCl: No results found for: EGFR  No components found for: CRCL  Current Barriers:  . Chronic Disease Management needs and support r/t Hypertension self-management  Case Manager Clinical Goal(s):  Marland Kitchen Over the next 120 days, patient will demonstrate adherence to prescribed treatment plan for              hypertension as evidenced by taking all medications as prescribed, monitoring and recording blood             pressure as directed and adhering to a cardiac prudent diet.  Interventions:  . Collaboration with Birdie Sons, MD regarding development and update of comprehensive plan o             of care as evidenced by provider attestation and co-signature   .       Inter-disciplinary care team collaboration (see longitudinal plan of care) . Reviewed medications. Encouraged to take as prescribed.  . Discussed established blood pressure parameters and indications for notifying a provider. . Discussed s/sx and complications r/t elevated blood pressure. Encouraged to monitor a few times a             week if unable to monitor daily and record readings.  Patient Goals/Self-Care Activities Over the next 120 days, patient will:  - Self administer medications as prescribed - Attend all scheduled provider appointments - Monitor and record blood pressure as discussed - Adhere to recommended cardiac prudent diet. - Call provider office for new concerns or BP outside of established parameters  Follow Up Plan:  -Will follow-up within 30 days.   Patient Care Plan: Fall Risk (Adult)    Problem Identified: Fall Risk     Long-Range Goal: Absence of Fall and Fall-Related Injury   Start Date: 04/01/2020  Expected End Date: 05/31/2020  Priority: High  Note:   Current Barriers:  Marland Kitchen Moderate risk for Falls d/t fall history and medication side effects.  Clinical Goal(s):  Marland Kitchen Over the next 60 days, patient will not experience falls. . Over the next 60 days, patient will work  with the [facility] team and utilize recommended fall prevention strategies.  Interventions:  . Collaboration with Birdie Sons, MD regarding development and update of comprehensive plan of care as evidenced by provider attestation and co-signature . Inter-disciplinary care team collaboration (see longitudinal plan of care) . Assessed fall history . Confirmed start of Physical Therapy services with [facility] . Provided education regarding safety and fall prevention measures. . Discussed importance of using safety devices and keeping cane/walker readily available to use when needed. . Reviewed medications and discussed potential side effects.  Patient Goals/Self-Care Activities Over the next 60 days, patient will:   - Utilize assistive devices appropriately with all ambulation - Change positions slowly - Wear secure non-skid footwear at all times with ambulation - Ensure  pathways are clear and utilize home lighting for dim lit areas   Follow Up Plan:  - Will follow up within 30 days             Ms. Fernando was given information about Chronic Care Management services including:  1. CCM service includes personalized support from designated clinical staff supervised by her physician, including individualized plan of care and coordination with other care providers 2. 24/7 contact phone numbers for assistance for urgent and routine care needs. 3. Service will only be billed when office clinical staff spend 20 minutes or more in a month to coordinate care. 4. Only one practitioner may furnish and bill the service in a calendar month. 5. The patient may stop CCM services at any time (effective at the end of the month) by phone call to the office staff. 6. The patient will be responsible for cost sharing (co-pay) of up to 20% of the service fee (after annual deductible is met).  Patient agreed to services and verbal consent obtained.      PLAN A  member of the care management team will follow-up with Mrs. Chalupa within the next 30 days.    [facility] Care Management [facility] [phone]

## 2020-04-01 NOTE — Patient Instructions (Signed)
Medication Instructions:   Your physician recommends that you continue on your current medications as directed. Please refer to the Current Medication list given to you today.  *If you need a refill on your cardiac medications before your next appointment, please call your pharmacy*   Lab Work: None ordered    Testing/Procedures: None ordered   Follow-Up: At [facility], you and your health needs are our priority.  As part of our continuing mission to provide you with exceptional heart care, we have created designated Provider Care Teams.  These Care Teams include your primary Cardiologist (physician) and Advanced Practice Providers (APPs -  Physician Assistants and Nurse Practitioners) who all work together to provide you with the care you need, when you need it.  We recommend signing up for the patient portal called "MyChart".  Sign up information is provided on this After Visit Summary.  MyChart is used to connect with patients for Virtual Visits (Telemedicine).  Patients are able to view lab/test results, encounter notes, upcoming appointments, etc.  Non-urgent messages can be sent to your provider as well.   To learn more about what you can do with MyChart, go to [email or web address].    Your next appointment:   1 month(s)  The format for your next appointment:   In Person  Provider:   You may see Ida Rogue, MD or one of the following Advanced Practice Providers on your designated Care Team:    Murray Hodgkins, NP

## 2020-04-01 NOTE — Patient Instructions (Addendum)
Thank you for allowing the Chronic Care Management team to participate in your care.   Patient Care Plan: Heart Failure (Adult)    Problem Identified: Symptom Exacerbation (Heart Failure)     Long-Range Goal: Symptom Exacerbation Prevented or Minimized   Start Date: 04/01/2020  Expected End Date: 07/30/2020  Priority: High  Note:   Current Barriers:  . Chronic Disease Management needs and support r/t Heart Failure  Nurse Case Manager Clinical Goal(s):  Over the next 120 days, patient will not require hospitalization or emergent care d/t complications r/t Heart Failure exacerbation.  Interventions:  . Collaboration with Birdie Sons, MD regarding development and update of comprehensive plan of care as evidenced by provider attestation and co-signature . Inter-disciplinary care team collaboration (see longitudinal plan of care) . Reviewed medications. Encouraged to take as prescribed. Encouraged to notify Cardiologist or PCP if unable to tolerate prescribed cardiac regimen. . Discussed basic overview of Heart Failure. . Discussed importance of weighing at least weekly and recording readings. Instructed to notify provider if weight increases >3 lbs overnight or > 5 lbs in a week. . Discussed s/sx of exacerbation along with indications for seeking immediate medical attention.  Patient Goals/Self-Care Activities Over the next 120 days, patient will:  - Complete Cardiology follow-up - Take medications as prescribed - Monitor and record weight.  - Notify Cardiologist or PCP for weight gain outside of established parameters. - Adhere to a low sodium diet and follow Heart failure plan as advised by the Cardiology team.   Follow Up Plan:  Will follow up within 30 days.   Patient Care Plan: Diabetes Type 2 (Adult)    Problem Identified: Glycemic Management (Diabetes, Type 2)     Long-Range Goal: Glycemic Management Optimized   Start Date: 04/01/2020  Expected End Date: 07/30/2020   Priority: High  Note:   Objective:  Lab Results  Component Value Date   HGBA1C 8.2 (A) 11/20/2019 .   Lab Results  Component Value Date   CREATININE 1.15 (H) 03/31/2020   CREATININE 1.04 (H) 03/25/2020   CREATININE 1.45 (H) 03/23/2020 .   Marland Kitchen No results found for: EGFR  Current Barriers:  . Chronic Disease Management needs and support related to Diabetes self-management   Case Manager Clinical Goal(s):  Over the next 120 days, patient will: Marland Kitchen Demonstrate adherence to prescribed treatment plan for Diabetes self management as evidenced by daily monitoring and recording of CBG, adherence to ADA/ carb modified diet and adherence to prescribed medication regimen.  Interventions:  . Collaboration with Birdie Sons, MD regarding development and update of comprehensive plan of care as evidenced by provider attestation and co-signature . Inter-disciplinary care team collaboration (see longitudinal plan of care) . Reviewed medications. Encouraged to take medications as prescribed and notify team with concerns regarding prescription cost. . Provided information regarding importance of consistent blood glucose monitoring. Encouraged to monitor and maintain a log. . Reviewed s/sx of hypoglycemia and hyperglycemia along with recommended interventions. . Discussed nutritional intake and importance of completing recommended DM preventive care.   Patient Goals/Self-Care Activities Over the next 120 days, patient will:  - Self administer oral medications and insulin as prescribed - Attend all scheduled provider appointments - Monitor blood glucose levels consistently and utilize recommended interventions - Adhere to prescribed ADA diet.   Follow Up Plan:  Will follow-up within 30 days       Patient Care Plan: Hypertension (Adult)    Problem Identified: Hypertension (Hypertension)     Long-Range  Goal: Hypertension Monitored   Start Date: 04/01/2020  Expected End Date: 07/30/2020   Priority: High  Note:   Objective:  . Last practice recorded BP readings:  BP Readings from Last 3 Encounters:  04/01/20 128/80  03/31/20 131/85  03/25/20 (!) 146/81 .   Marland Kitchen Most recent eGFR/CrCl: No results found for: EGFR  No components found for: CRCL  Current Barriers:  . Chronic Disease Management needs and support r/t Hypertension self-management  Case Manager Clinical Goal(s):  Marland Kitchen Over the next 120 days, patient will demonstrate adherence to prescribed treatment plan for              hypertension as evidenced by taking all medications as prescribed, monitoring and recording blood             pressure as directed and adhering to a cardiac prudent diet.  Interventions:  . Collaboration with Birdie Sons, MD regarding development and update of comprehensive plan o             of care as evidenced by provider attestation and co-signature   .       Inter-disciplinary care team collaboration (see longitudinal plan of care) . Reviewed medications. Encouraged to take as prescribed.  . Discussed established blood pressure parameters and indications for notifying a provider. . Discussed s/sx and complications r/t elevated blood pressure. Encouraged to monitor a few times a             week if unable to monitor daily and record readings.  Patient Goals/Self-Care Activities Over the next 120 days, patient will:  - Self administer medications as prescribed - Attend all scheduled provider appointments - Monitor and record blood pressure as discussed - Adhere to recommended cardiac prudent diet. - Call provider office for new concerns or BP outside of established parameters  Follow Up Plan:  -Will follow-up within 30 days.   Patient Care Plan: Fall Risk (Adult)    Problem Identified: Fall Risk     Long-Range Goal: Absence of Fall and Fall-Related Injury   Start Date: 04/01/2020  Expected End Date: 05/31/2020  Priority: High  Note:   Current Barriers:  Marland Kitchen Moderate risk for  Falls d/t fall history and medication side effects.  Clinical Goal(s):  Marland Kitchen Over the next 60 days, patient will not experience falls. . Over the next 60 days, patient will work with the [facility] team and utilize recommended fall prevention strategies.  Interventions:  . Collaboration with Birdie Sons, MD regarding development and update of comprehensive plan of care as evidenced by provider attestation and co-signature . Inter-disciplinary care team collaboration (see longitudinal plan of care) . Assessed fall history . Confirmed start of Physical Therapy services with [facility] . Provided education regarding safety and fall prevention measures. . Discussed importance of using safety devices and keeping cane/walker readily available to use when needed. . Reviewed medications and discussed potential side effects.  Patient Goals/Self-Care Activities Over the next 60 days, patient will:   - Utilize assistive devices appropriately with all ambulation - Change positions slowly - Wear secure non-skid footwear at all times with ambulation - Ensure pathways are clear and utilize home lighting for dim lit areas   Follow Up Plan:  - Will follow up within 30 days             Ms. Brownstein was given information about Chronic Care Management services including:  1. CCM service includes personalized support from designated clinical staff  supervised by her physician, including individualized plan of care and coordination with other care providers 2. 24/7 contact phone numbers for assistance for urgent and routine care needs. 3. Service will only be billed when office clinical staff spend 20 minutes or more in a month to coordinate care. 4. Only one practitioner may furnish and bill the service in a calendar month. 5. The patient may stop CCM services at any time (effective at the end of the month) by phone call to the office staff. 6. The patient will be  responsible for cost sharing (co-pay) of up to 20% of the service fee (after annual deductible is met).  Patient agreed to services and verbal consent obtained.       Mrs. Merrick's verbalized understanding of the information discussed during the telephonic outreach today. Declined need for mailed/printed information. A member of the care management team will follow-up with Mrs. Batchelder within the next 30 days.    [facility] Care Management [facility] [phone]

## 2020-04-02 DIAGNOSIS — N189 Chronic kidney disease, unspecified: Secondary | ICD-10-CM | POA: Diagnosis not present

## 2020-04-02 DIAGNOSIS — W109XXD Fall (on) (from) unspecified stairs and steps, subsequent encounter: Secondary | ICD-10-CM | POA: Diagnosis not present

## 2020-04-02 DIAGNOSIS — M75111 Incomplete rotator cuff tear or rupture of right shoulder, not specified as traumatic: Secondary | ICD-10-CM | POA: Diagnosis not present

## 2020-04-02 DIAGNOSIS — R2689 Other abnormalities of gait and mobility: Secondary | ICD-10-CM | POA: Diagnosis not present

## 2020-04-02 DIAGNOSIS — E1122 Type 2 diabetes mellitus with diabetic chronic kidney disease: Secondary | ICD-10-CM | POA: Diagnosis not present

## 2020-04-02 DIAGNOSIS — I129 Hypertensive chronic kidney disease with stage 1 through stage 4 chronic kidney disease, or unspecified chronic kidney disease: Secondary | ICD-10-CM | POA: Diagnosis not present

## 2020-04-06 DIAGNOSIS — N189 Chronic kidney disease, unspecified: Secondary | ICD-10-CM | POA: Diagnosis not present

## 2020-04-06 DIAGNOSIS — W109XXD Fall (on) (from) unspecified stairs and steps, subsequent encounter: Secondary | ICD-10-CM | POA: Diagnosis not present

## 2020-04-06 DIAGNOSIS — R2689 Other abnormalities of gait and mobility: Secondary | ICD-10-CM | POA: Diagnosis not present

## 2020-04-06 DIAGNOSIS — M75111 Incomplete rotator cuff tear or rupture of right shoulder, not specified as traumatic: Secondary | ICD-10-CM | POA: Diagnosis not present

## 2020-04-06 DIAGNOSIS — I129 Hypertensive chronic kidney disease with stage 1 through stage 4 chronic kidney disease, or unspecified chronic kidney disease: Secondary | ICD-10-CM | POA: Diagnosis not present

## 2020-04-06 DIAGNOSIS — E1122 Type 2 diabetes mellitus with diabetic chronic kidney disease: Secondary | ICD-10-CM | POA: Diagnosis not present

## 2020-04-07 DIAGNOSIS — M75111 Incomplete rotator cuff tear or rupture of right shoulder, not specified as traumatic: Secondary | ICD-10-CM | POA: Diagnosis not present

## 2020-04-07 DIAGNOSIS — E1122 Type 2 diabetes mellitus with diabetic chronic kidney disease: Secondary | ICD-10-CM | POA: Diagnosis not present

## 2020-04-07 DIAGNOSIS — W109XXD Fall (on) (from) unspecified stairs and steps, subsequent encounter: Secondary | ICD-10-CM | POA: Diagnosis not present

## 2020-04-07 DIAGNOSIS — I129 Hypertensive chronic kidney disease with stage 1 through stage 4 chronic kidney disease, or unspecified chronic kidney disease: Secondary | ICD-10-CM | POA: Diagnosis not present

## 2020-04-07 DIAGNOSIS — N189 Chronic kidney disease, unspecified: Secondary | ICD-10-CM | POA: Diagnosis not present

## 2020-04-07 DIAGNOSIS — R2689 Other abnormalities of gait and mobility: Secondary | ICD-10-CM | POA: Diagnosis not present

## 2020-04-11 DIAGNOSIS — N189 Chronic kidney disease, unspecified: Secondary | ICD-10-CM | POA: Diagnosis not present

## 2020-04-11 DIAGNOSIS — I129 Hypertensive chronic kidney disease with stage 1 through stage 4 chronic kidney disease, or unspecified chronic kidney disease: Secondary | ICD-10-CM | POA: Diagnosis not present

## 2020-04-11 DIAGNOSIS — E1122 Type 2 diabetes mellitus with diabetic chronic kidney disease: Secondary | ICD-10-CM | POA: Diagnosis not present

## 2020-04-11 DIAGNOSIS — W109XXD Fall (on) (from) unspecified stairs and steps, subsequent encounter: Secondary | ICD-10-CM | POA: Diagnosis not present

## 2020-04-11 DIAGNOSIS — M75111 Incomplete rotator cuff tear or rupture of right shoulder, not specified as traumatic: Secondary | ICD-10-CM | POA: Diagnosis not present

## 2020-04-11 DIAGNOSIS — R2689 Other abnormalities of gait and mobility: Secondary | ICD-10-CM | POA: Diagnosis not present

## 2020-04-12 DIAGNOSIS — R2689 Other abnormalities of gait and mobility: Secondary | ICD-10-CM | POA: Diagnosis not present

## 2020-04-12 DIAGNOSIS — Z794 Long term (current) use of insulin: Secondary | ICD-10-CM | POA: Diagnosis not present

## 2020-04-12 DIAGNOSIS — N189 Chronic kidney disease, unspecified: Secondary | ICD-10-CM | POA: Diagnosis not present

## 2020-04-12 DIAGNOSIS — W109XXD Fall (on) (from) unspecified stairs and steps, subsequent encounter: Secondary | ICD-10-CM | POA: Diagnosis not present

## 2020-04-12 DIAGNOSIS — Z87891 Personal history of nicotine dependence: Secondary | ICD-10-CM | POA: Diagnosis not present

## 2020-04-12 DIAGNOSIS — M75111 Incomplete rotator cuff tear or rupture of right shoulder, not specified as traumatic: Secondary | ICD-10-CM | POA: Diagnosis not present

## 2020-04-12 DIAGNOSIS — J449 Chronic obstructive pulmonary disease, unspecified: Secondary | ICD-10-CM | POA: Diagnosis not present

## 2020-04-12 DIAGNOSIS — E1122 Type 2 diabetes mellitus with diabetic chronic kidney disease: Secondary | ICD-10-CM | POA: Diagnosis not present

## 2020-04-12 DIAGNOSIS — I2510 Atherosclerotic heart disease of native coronary artery without angina pectoris: Secondary | ICD-10-CM | POA: Diagnosis not present

## 2020-04-12 DIAGNOSIS — I129 Hypertensive chronic kidney disease with stage 1 through stage 4 chronic kidney disease, or unspecified chronic kidney disease: Secondary | ICD-10-CM | POA: Diagnosis not present

## 2020-04-14 DIAGNOSIS — R2689 Other abnormalities of gait and mobility: Secondary | ICD-10-CM | POA: Diagnosis not present

## 2020-04-14 DIAGNOSIS — W109XXD Fall (on) (from) unspecified stairs and steps, subsequent encounter: Secondary | ICD-10-CM | POA: Diagnosis not present

## 2020-04-14 DIAGNOSIS — I129 Hypertensive chronic kidney disease with stage 1 through stage 4 chronic kidney disease, or unspecified chronic kidney disease: Secondary | ICD-10-CM | POA: Diagnosis not present

## 2020-04-14 DIAGNOSIS — N189 Chronic kidney disease, unspecified: Secondary | ICD-10-CM | POA: Diagnosis not present

## 2020-04-14 DIAGNOSIS — M75111 Incomplete rotator cuff tear or rupture of right shoulder, not specified as traumatic: Secondary | ICD-10-CM | POA: Diagnosis not present

## 2020-04-14 DIAGNOSIS — E1122 Type 2 diabetes mellitus with diabetic chronic kidney disease: Secondary | ICD-10-CM | POA: Diagnosis not present

## 2020-04-16 DIAGNOSIS — I129 Hypertensive chronic kidney disease with stage 1 through stage 4 chronic kidney disease, or unspecified chronic kidney disease: Secondary | ICD-10-CM | POA: Diagnosis not present

## 2020-04-16 DIAGNOSIS — N189 Chronic kidney disease, unspecified: Secondary | ICD-10-CM | POA: Diagnosis not present

## 2020-04-16 DIAGNOSIS — R2689 Other abnormalities of gait and mobility: Secondary | ICD-10-CM | POA: Diagnosis not present

## 2020-04-16 DIAGNOSIS — E1122 Type 2 diabetes mellitus with diabetic chronic kidney disease: Secondary | ICD-10-CM | POA: Diagnosis not present

## 2020-04-16 DIAGNOSIS — W109XXD Fall (on) (from) unspecified stairs and steps, subsequent encounter: Secondary | ICD-10-CM | POA: Diagnosis not present

## 2020-04-16 DIAGNOSIS — M75111 Incomplete rotator cuff tear or rupture of right shoulder, not specified as traumatic: Secondary | ICD-10-CM | POA: Diagnosis not present

## 2020-04-17 DIAGNOSIS — I152 Hypertension secondary to endocrine disorders: Secondary | ICD-10-CM | POA: Diagnosis not present

## 2020-04-17 DIAGNOSIS — R2689 Other abnormalities of gait and mobility: Secondary | ICD-10-CM | POA: Diagnosis not present

## 2020-04-17 DIAGNOSIS — W109XXD Fall (on) (from) unspecified stairs and steps, subsequent encounter: Secondary | ICD-10-CM | POA: Diagnosis not present

## 2020-04-17 DIAGNOSIS — E785 Hyperlipidemia, unspecified: Secondary | ICD-10-CM | POA: Diagnosis not present

## 2020-04-17 DIAGNOSIS — Z794 Long term (current) use of insulin: Secondary | ICD-10-CM | POA: Diagnosis not present

## 2020-04-17 DIAGNOSIS — F17200 Nicotine dependence, unspecified, uncomplicated: Secondary | ICD-10-CM | POA: Diagnosis not present

## 2020-04-17 DIAGNOSIS — E1121 Type 2 diabetes mellitus with diabetic nephropathy: Secondary | ICD-10-CM | POA: Diagnosis not present

## 2020-04-17 DIAGNOSIS — I129 Hypertensive chronic kidney disease with stage 1 through stage 4 chronic kidney disease, or unspecified chronic kidney disease: Secondary | ICD-10-CM | POA: Diagnosis not present

## 2020-04-17 DIAGNOSIS — N189 Chronic kidney disease, unspecified: Secondary | ICD-10-CM | POA: Diagnosis not present

## 2020-04-17 DIAGNOSIS — E1122 Type 2 diabetes mellitus with diabetic chronic kidney disease: Secondary | ICD-10-CM | POA: Diagnosis not present

## 2020-04-17 DIAGNOSIS — M75111 Incomplete rotator cuff tear or rupture of right shoulder, not specified as traumatic: Secondary | ICD-10-CM | POA: Diagnosis not present

## 2020-04-17 DIAGNOSIS — E1159 Type 2 diabetes mellitus with other circulatory complications: Secondary | ICD-10-CM | POA: Diagnosis not present

## 2020-04-17 DIAGNOSIS — E1169 Type 2 diabetes mellitus with other specified complication: Secondary | ICD-10-CM | POA: Diagnosis not present

## 2020-04-18 DIAGNOSIS — R2689 Other abnormalities of gait and mobility: Secondary | ICD-10-CM | POA: Diagnosis not present

## 2020-04-18 DIAGNOSIS — I129 Hypertensive chronic kidney disease with stage 1 through stage 4 chronic kidney disease, or unspecified chronic kidney disease: Secondary | ICD-10-CM | POA: Diagnosis not present

## 2020-04-18 DIAGNOSIS — M75111 Incomplete rotator cuff tear or rupture of right shoulder, not specified as traumatic: Secondary | ICD-10-CM | POA: Diagnosis not present

## 2020-04-18 DIAGNOSIS — N189 Chronic kidney disease, unspecified: Secondary | ICD-10-CM | POA: Diagnosis not present

## 2020-04-18 DIAGNOSIS — W109XXD Fall (on) (from) unspecified stairs and steps, subsequent encounter: Secondary | ICD-10-CM | POA: Diagnosis not present

## 2020-04-18 DIAGNOSIS — E1122 Type 2 diabetes mellitus with diabetic chronic kidney disease: Secondary | ICD-10-CM | POA: Diagnosis not present

## 2020-04-22 DIAGNOSIS — E1122 Type 2 diabetes mellitus with diabetic chronic kidney disease: Secondary | ICD-10-CM | POA: Diagnosis not present

## 2020-04-22 DIAGNOSIS — R2689 Other abnormalities of gait and mobility: Secondary | ICD-10-CM | POA: Diagnosis not present

## 2020-04-22 DIAGNOSIS — M75111 Incomplete rotator cuff tear or rupture of right shoulder, not specified as traumatic: Secondary | ICD-10-CM | POA: Diagnosis not present

## 2020-04-22 DIAGNOSIS — W109XXD Fall (on) (from) unspecified stairs and steps, subsequent encounter: Secondary | ICD-10-CM | POA: Diagnosis not present

## 2020-04-22 DIAGNOSIS — N189 Chronic kidney disease, unspecified: Secondary | ICD-10-CM | POA: Diagnosis not present

## 2020-04-22 DIAGNOSIS — I129 Hypertensive chronic kidney disease with stage 1 through stage 4 chronic kidney disease, or unspecified chronic kidney disease: Secondary | ICD-10-CM | POA: Diagnosis not present

## 2020-04-23 DIAGNOSIS — I129 Hypertensive chronic kidney disease with stage 1 through stage 4 chronic kidney disease, or unspecified chronic kidney disease: Secondary | ICD-10-CM | POA: Diagnosis not present

## 2020-04-23 DIAGNOSIS — M75111 Incomplete rotator cuff tear or rupture of right shoulder, not specified as traumatic: Secondary | ICD-10-CM | POA: Diagnosis not present

## 2020-04-23 DIAGNOSIS — N189 Chronic kidney disease, unspecified: Secondary | ICD-10-CM | POA: Diagnosis not present

## 2020-04-23 DIAGNOSIS — W109XXD Fall (on) (from) unspecified stairs and steps, subsequent encounter: Secondary | ICD-10-CM | POA: Diagnosis not present

## 2020-04-23 DIAGNOSIS — E1122 Type 2 diabetes mellitus with diabetic chronic kidney disease: Secondary | ICD-10-CM | POA: Diagnosis not present

## 2020-04-23 DIAGNOSIS — R2689 Other abnormalities of gait and mobility: Secondary | ICD-10-CM | POA: Diagnosis not present

## 2020-04-24 DIAGNOSIS — E1122 Type 2 diabetes mellitus with diabetic chronic kidney disease: Secondary | ICD-10-CM | POA: Diagnosis not present

## 2020-04-24 DIAGNOSIS — N189 Chronic kidney disease, unspecified: Secondary | ICD-10-CM | POA: Diagnosis not present

## 2020-04-24 DIAGNOSIS — I129 Hypertensive chronic kidney disease with stage 1 through stage 4 chronic kidney disease, or unspecified chronic kidney disease: Secondary | ICD-10-CM | POA: Diagnosis not present

## 2020-04-24 DIAGNOSIS — R2689 Other abnormalities of gait and mobility: Secondary | ICD-10-CM | POA: Diagnosis not present

## 2020-04-24 DIAGNOSIS — W109XXD Fall (on) (from) unspecified stairs and steps, subsequent encounter: Secondary | ICD-10-CM | POA: Diagnosis not present

## 2020-04-24 DIAGNOSIS — M75111 Incomplete rotator cuff tear or rupture of right shoulder, not specified as traumatic: Secondary | ICD-10-CM | POA: Diagnosis not present

## 2020-04-28 DIAGNOSIS — W109XXD Fall (on) (from) unspecified stairs and steps, subsequent encounter: Secondary | ICD-10-CM | POA: Diagnosis not present

## 2020-04-28 DIAGNOSIS — E1122 Type 2 diabetes mellitus with diabetic chronic kidney disease: Secondary | ICD-10-CM | POA: Diagnosis not present

## 2020-04-28 DIAGNOSIS — M75111 Incomplete rotator cuff tear or rupture of right shoulder, not specified as traumatic: Secondary | ICD-10-CM | POA: Diagnosis not present

## 2020-04-28 DIAGNOSIS — N189 Chronic kidney disease, unspecified: Secondary | ICD-10-CM | POA: Diagnosis not present

## 2020-04-28 DIAGNOSIS — R2689 Other abnormalities of gait and mobility: Secondary | ICD-10-CM | POA: Diagnosis not present

## 2020-04-28 DIAGNOSIS — I129 Hypertensive chronic kidney disease with stage 1 through stage 4 chronic kidney disease, or unspecified chronic kidney disease: Secondary | ICD-10-CM | POA: Diagnosis not present

## 2020-04-30 DIAGNOSIS — W109XXD Fall (on) (from) unspecified stairs and steps, subsequent encounter: Secondary | ICD-10-CM | POA: Diagnosis not present

## 2020-04-30 DIAGNOSIS — M75111 Incomplete rotator cuff tear or rupture of right shoulder, not specified as traumatic: Secondary | ICD-10-CM | POA: Diagnosis not present

## 2020-04-30 DIAGNOSIS — R2689 Other abnormalities of gait and mobility: Secondary | ICD-10-CM | POA: Diagnosis not present

## 2020-04-30 DIAGNOSIS — N189 Chronic kidney disease, unspecified: Secondary | ICD-10-CM | POA: Diagnosis not present

## 2020-04-30 DIAGNOSIS — I129 Hypertensive chronic kidney disease with stage 1 through stage 4 chronic kidney disease, or unspecified chronic kidney disease: Secondary | ICD-10-CM | POA: Diagnosis not present

## 2020-04-30 DIAGNOSIS — E1122 Type 2 diabetes mellitus with diabetic chronic kidney disease: Secondary | ICD-10-CM | POA: Diagnosis not present

## 2020-05-01 ENCOUNTER — Encounter: Payer: Self-pay | Admitting: Nurse Practitioner

## 2020-05-01 ENCOUNTER — Other Ambulatory Visit: Payer: Self-pay

## 2020-05-01 ENCOUNTER — Ambulatory Visit (INDEPENDENT_AMBULATORY_CARE_PROVIDER_SITE_OTHER): Payer: Medicare Other | Admitting: Nurse Practitioner

## 2020-05-01 VITALS — BP 134/85 | HR 68 | Ht 66.0 in | Wt 165.0 lb

## 2020-05-01 DIAGNOSIS — I2510 Atherosclerotic heart disease of native coronary artery without angina pectoris: Secondary | ICD-10-CM

## 2020-05-01 DIAGNOSIS — N183 Chronic kidney disease, stage 3 unspecified: Secondary | ICD-10-CM | POA: Diagnosis not present

## 2020-05-01 DIAGNOSIS — Z720 Tobacco use: Secondary | ICD-10-CM

## 2020-05-01 DIAGNOSIS — E785 Hyperlipidemia, unspecified: Secondary | ICD-10-CM | POA: Diagnosis not present

## 2020-05-01 DIAGNOSIS — I10 Essential (primary) hypertension: Secondary | ICD-10-CM | POA: Diagnosis not present

## 2020-05-01 NOTE — Patient Instructions (Signed)
Medication Instructions:  No changes  *If you need a refill on your cardiac medications before your next appointment, please call your pharmacy*   Lab Work: None  If you have labs (blood work) drawn today and your tests are completely normal, you will receive your results only by: Marland Kitchen MyChart Message (if you have MyChart) OR . A paper copy in the mail If you have any lab test that is abnormal or we need to change your treatment, we will call you to review the results.   Testing/Procedures: None   Follow-Up: At [facility], you and your health needs are our priority.  As part of our continuing mission to provide you with exceptional heart care, we have created designated Provider Care Teams.  These Care Teams include your primary Cardiologist (physician) and Advanced Practice Providers (APPs -  Physician Assistants and Nurse Practitioners) who all work together to provide you with the care you need, when you need it.  Your next appointment:   3 month(s)  The format for your next appointment:   In Person  Provider:   Ida Rogue, MD or Murray Hodgkins, NP

## 2020-05-01 NOTE — Progress Notes (Signed)
Office Visit    Patient Name: Hannah Kirby Date of Encounter: 05/01/2020  Primary Care Provider:  Birdie Sons, MD Primary Cardiologist:  Ida Rogue, MD  Chief Complaint    75 y/o female with hx of CAD s/p distal RCA stenting 03/24/20, proximal/mid RCA stenting x 2 in 2008 w/mild to mod in-stent restenosis seen on cath 2/22, HTN, hyperlipidemia, COPD, tobacco abuse, stroke, anxiety, CKD 3b, T2DM, and PVCs who presents today for follow-up of her CAD.   Past Medical History    Past Medical History:  Diagnosis Date  . Anemia   . Anxiety   . Arthritis   . CAD (coronary artery disease)    a. 02/2006 PCI: BMS x 2 to RCA, cath o/w without significant coronary disease; b. nuclear stress test 07/2014: No ischemia/infarct; c. 11/2017 MV: no isch/infarct, EF 55-65%; d. 03/2020 NSTEMI/PCI: LM nl, LAD min irregs, RI 25, small, LCX nl, RCA 30p/m ISR, 99d (3.0x15 Resolute Onyx DES).  . Chronic bronchitis ([place])    secondary to cigarette smoking  . Chronic kidney disease (CKD), stage III (moderate) (HCC)   . COPD (chronic obstructive pulmonary disease) ([place])   . Diabetes mellitus   . Diastolic dysfunction    a. 07/2019 Echo: EF 60-65%, no rwma, mod LVH. Nl RV size/fxn; b. 03/2020 Echo: EF 60-65%, mod LVH, gr1 DD, nl RV size/fxn, mild AS.  Marland Kitchen FHx: allergies   . GERD (gastroesophageal reflux disease)   . Goiter   . Granulomatous disease ([place])   . Hernia   . Hyperlipidemia   . Hypertension   . Kidney stone on left side 2013  . Microalbuminuria   . Obesity   . Panic attacks   . PVC's (premature ventricular contractions)    a. 03/2018 [place]: Occas PVCs (2.5%). Triggered events assoc w/ PVC/PAC.  Marland Kitchen Retinal tear 2020  . Smokers' cough ([place])   . Spinal stenosis   . Stroke ([place]) 10/29/2016   mild left side weakness  . Stroke ([place]) 08/01/2019  . Tobacco abuse    Past Surgical History:  Procedure Laterality Date  . ABDOMINAL HYSTERECTOMY    . BREAST SURGERY    . CATARACT EXTRACTION  W/PHACO Right 03/01/2017   Procedure: CATARACT EXTRACTION PHACO AND INTRAOCULAR LENS PLACEMENT ([place]) RIGHT DIABETIC;  Surgeon: Leandrew Koyanagi, MD;  Location: [facility];  Service: Ophthalmology;  Laterality: Right;  . CATARACT EXTRACTION W/PHACO Left 03/22/2017   Procedure: CATARACT EXTRACTION PHACO AND INTRAOCULAR LENS PLACEMENT ([place]) LEFT DIABETIC;  Surgeon: Leandrew Koyanagi, MD;  Location: [facility];  Service: Ophthalmology;  Laterality: Left;  Diabetic - insulin and oral meds  . COLONOSCOPY WITH PROPOFOL N/A 09/23/2014   Procedure: COLONOSCOPY WITH PROPOFOL;  Surgeon: Lollie Sails, MD;  Location: [facility] ENDOSCOPY;  Service: Endoscopy;  Laterality: N/A;  . COLONOSCOPY WITH PROPOFOL N/A 01/11/2018   Procedure: COLONOSCOPY WITH PROPOFOL;  Surgeon: Lollie Sails, MD;  Location: [facility] ENDOSCOPY;  Service: Endoscopy;  Laterality: N/A;  . COLONOSCOPY WITH PROPOFOL N/A 04/24/2018   Procedure: COLONOSCOPY WITH PROPOFOL;  Surgeon: Lollie Sails, MD;  Location[facility] ENDOSCOPY;  Service: Endoscopy;  Laterality: N/A;  . COLONOSCOPY WITH PROPOFOL N/A 11/19/2019   Procedure: COLONOSCOPY WITH PROPOFOL;  Surgeon: Lucilla Lame, MD;  Location: [facility] ENDOSCOPY;  Service: Endoscopy;  Laterality: N/A;  . CORONARY ANGIOPLASTY WITH STENT PLACEMENT  2008  . CORONARY STENT INTERVENTION N/A 03/24/2020   Procedure: CORONARY STENT INTERVENTION;  Surgeon: Nelva Bush, MD;  Location: [facility] CV LAB;  Service: Cardiovascular;  Laterality: N/A;  . ESOPHAGOGASTRODUODENOSCOPY (EGD) WITH PROPOFOL N/A 12/30/2014   Procedure: ESOPHAGOGASTRODUODENOSCOPY (EGD) WITH PROPOFOL;  Surgeon: Lollie Sails, MD;  Location: [facility] ENDOSCOPY;  Service: Endoscopy;  Laterality: N/A;  . ESOPHAGOGASTRODUODENOSCOPY (EGD) WITH PROPOFOL N/A 07/19/2016   Procedure: ESOPHAGOGASTRODUODENOSCOPY (EGD) WITH PROPOFOL;  Surgeon: Lollie Sails, MD;  Location: [facility] ENDOSCOPY;  Service: Endoscopy;  Laterality: N/A;   . ESOPHAGOGASTRODUODENOSCOPY (EGD) WITH PROPOFOL N/A 04/24/2018   Procedure: ESOPHAGOGASTRODUODENOSCOPY (EGD) WITH PROPOFOL;  Surgeon: Lollie Sails, MD;  Location: [facility] ENDOSCOPY;  Service: Endoscopy;  Laterality: N/A;  . hysterectomy (other)    . LEFT HEART CATH AND CORONARY ANGIOGRAPHY N/A 03/24/2020   Procedure: LEFT HEART CATH AND CORONARY ANGIOGRAPHY;  Surgeon: Nelva Bush, MD;  Location: [facility] CV LAB;  Service: Cardiovascular;  Laterality: N/A;    Allergies  Allergies  Allergen Reactions  . Spironolactone Shortness Of Breath  . Sulfonamide Derivatives Other (See Comments)    Last taken as a child; made her mouth break out  . Other Other (See Comments)    Mouth blisters  . Sulfa Antibiotics Other (See Comments)    Mouth blisters Welts on mouth   . Codeine Nausea And Vomiting and Other (See Comments)    Nausea  . Prednisone Palpitations and Other (See Comments)    Made her legs "feel weird."    History of Present Illness    Pleasant 75 y/o female with hx of CAD s/p PCI with DES x 1 to RCA 03/2020, remote RCA stenting x 2 in 2008, HTN, hyperlipidemia, COPD, T2DM, tobacco abuse, stroke, anxiety, CKD 3b, and PVCs. She presented to our office on 1/31 following 4 days of intermittent chest pressure relieved with ntg. She was chest pain free at the office but her troponin was returned after she left the office at 1000 and she was called by Dr. [name] and advised to go to the ED for admission. Upon admission for NSTEMI, left heart cath revealed 99% distal RCA stenosis, mild non-obstructive left coronary artery disease w/20-30% ostial ramus stenosis, patent prox/mid RCA stents w/mild to mod in-stent restenosis (~30%), mildly elev LVEDP 15-20 mmHg, successful PCI to distal RCA w/Resolute Onyx 3.0 x 15 mm DES. The remainder of her hospitalization was uneventful and she was d/c'ed on 2/2. She was seen in the office for follow-up on 04/01/20 and was doing well without chest  discomfort. She had stable DOE in the setting of COPD.  Today, she reports that she has resumed all ADLs without her husband's assistance but she continues to experience fatigue and dyspnea that she feels is stable and attributes to h/o COPD and ongoing smoking. She denies chest pain, palpitations, pnd, orthopnea, n, v, dizziness, syncope, edema, weight gain, or early satiety.   Home Medications    Prior to Admission medications   Medication Sig Start Date End Date Taking? Authorizing Provider  acetaminophen (TYLENOL) 500 MG tablet Take 500 mg by mouth every 6 (six) hours as needed.    [provider]  albuterol (VENTOLIN HFA) 108 (90 Base) MCG/ACT inhaler Inhale 2 puffs into the lungs every 4 (four) hours as needed for wheezing or shortness of breath. 09/21/17   Birdie Sons, MD  amLODipine (NORVASC) 5 MG tablet Take 5 mg by mouth daily.    [provider]  aspirin (ASPIR-81) 81 MG EC tablet Take 81 mg by mouth daily.    [provider]  clopidogrel (PLAVIX) 75 MG tablet Take 1 tablet (75 mg  total) by mouth daily. 11/06/19   Minna Merritts, MD  cyanocobalamin 1000 MCG tablet Take 1,000 mcg by mouth daily.    [provider]  ezetimibe (ZETIA) 10 MG tablet Take 1 tablet (10 mg total) by mouth daily. 11/06/19   Minna Merritts, MD  fluticasone furoate-vilanterol (BREO ELLIPTA) 100-25 MCG/INH AEPB Inhale 1 puff into the lungs daily. 11/20/19   Birdie Sons, MD  HYDROcodone-acetaminophen (NORCO/VICODIN) 5-325 MG tablet Take 1 tablet by mouth every 6 (six) hours as needed. 03/31/20   Birdie Sons, MD  insulin glargine (LANTUS) 100 UNIT/ML injection Inject 22 Units into the skin daily.     [provider]  lisinopril (ZESTRIL) 20 MG tablet Take 1 tablet (20 mg total) by mouth daily. 11/06/19   Minna Merritts, MD  metoprolol tartrate (LOPRESSOR) 100 MG tablet Take 1 tablet (100 mg total) by mouth 2 (two) times daily. 11/06/19   Minna Merritts,  MD  Multiple Vitamins-Minerals (DAILY MULTI) TABS Take 1 tablet by mouth daily.    [provider]  nitroGLYCERIN (NITROSTAT) 0.4 MG SL tablet Place 1 tablet (0.4 mg total) under the tongue every 5 (five) minutes as needed. 03/23/20   Minna Merritts, MD  Omega-3 Fatty Acids (FISH OIL) 1000 MG CAPS Take 2 capsules by mouth in the morning and at bedtime.     [provider]  ondansetron (ZOFRAN ODT) 4 MG disintegrating tablet Take 1 tablet (4 mg total) by mouth every 8 (eight) hours as needed for nausea or vomiting. 08/06/16   Alfred Levins, [place], MD  RABEprazole (ACIPHEX) 20 MG tablet Take 1 tablet (20 mg total) by mouth daily. 15 Mins. before evening meal 10/23/19   Lucilla Lame, MD  rosuvastatin (CRESTOR) 10 MG tablet Take 1 tablet (10 mg total) by mouth daily. 11/06/19   Minna Merritts, MD  Semaglutide,0.25 or 0.5MG /DOS, 2 MG/1.5ML SOPN Inject 0.5 mg into the skin once a week. 01/13/20   [provider]  sucralfate (CARAFATE) 1 g tablet Take 1 tablet (1 g total) by mouth 2 (two) times daily. 15 Minutes before evening meal and at bed time 10/23/19   Lucilla Lame, MD  tiotropium (SPIRIVA) 18 MCG inhalation capsule Place 1 capsule (18 mcg total) into inhaler and inhale daily. 11/20/19   Birdie Sons, MD    Review of Systems    Denies chest discomfort, palpitations, edema, orthopnea, PND, syncope. Reports continued stable dyspnea. All other systems reviewed and are otherwise negative except as noted above.  Physical Exam    VS:  BP 134/85 (BP Location: Left Arm, Patient Position: Sitting, Cuff Size: Large) Comment: with Dynamap  Pulse 68   Ht 5\' 6"  (1.676 m)   Wt 165 lb (74.8 kg)   SpO2 94%   BMI 26.63 kg/m  , BMI Body mass index is 26.63 kg/m. GEN: Well nourished, well developed, in no acute distress. HEENT: normal. Neck: Supple, no JVD, carotid bruits, or masses. Cardiac: RRR, no murmurs, rubs, or gallops. No clubbing, cyanosis, edema.  Radials/PT 2+ and equal  bilaterally.  Respiratory:  Respirations regular and unlabored, diminished breath sounds bilaterally upon auscultation. GI: Soft, nontender, nondistended, BS + x 4. MS: no deformity or atrophy. Skin: warm and dry, no rash. Neuro:  Strength and sensation are intact. Psych: Normal affect.  Accessory Clinical Findings    ECG personally reviewed by me today - not ordered today - no acute changes.  Lab Results  Component Value Date  WBC 9.0 03/31/2020   HGB 16.4 (H) 03/31/2020   HCT 47.7 (H) 03/31/2020   MCV 94 03/31/2020   PLT 232 03/31/2020   Lab Results  Component Value Date   CREATININE 1.15 (H) 03/31/2020   BUN 16 03/31/2020   NA 141 03/31/2020   K 4.0 03/31/2020   CL 103 03/31/2020   CO2 23 03/31/2020   Lab Results  Component Value Date   ALT 19 03/23/2020   AST 34 03/23/2020   ALKPHOS 86 03/23/2020   BILITOT 0.6 03/23/2020   Lab Results  Component Value Date   CHOL 147 03/24/2020   HDL 42 03/24/2020   LDLCALC 70 03/24/2020   TRIG 177 (H) 03/24/2020   CHOLHDL 3.5 03/24/2020    Lab Results  Component Value Date   HGBA1C 8.2 (A) 11/20/2019    Assessment & Plan    1.  CAD native arteries without angina: recent DES to RCA, prior hx stenting x 2 RCA. She denies chest discomfort or other concerns. She reports complete relief of chest pressure since stenting. Compliant with plavix and aspirin. She received a call from cardiac rehab but told them she was not interested at this time. She is not very active at home. We encouraged her that participation in CR leads to improved outcomes and may help with her dyspnea as well. She says she will think about it. We will see her back in 3 months.  Cont  blocker, acei, statin, and zetia.  2. Essential hypertension: BP stable in the office today. She reports similar home readings of 130s over 70s. She reports compliance with medications without adverse effect. Continue amlodipine,  blocker, lisinopril.   3. Tobacco abuse/COPD:  She continues to smoke 1 ppd and has not decreased the amount since hospital d/c. Complete cessation advised. She does not wish to pursue pharmacologic aids to help her quit. Her COPD is managed by PCP. She reports no recent changes in medications. Management per PCP.   4. Hyperlipidemia/hypertriglyceridemia: LDL 70 at goal. Tolerating rosuvastatin and Zetia without adverse effect. We discussed the addition of Vascepa but cost is an issue. She would like to work on decreasing the white foods (high starch, high sugar, simple carbs) from her diet. Continue to follow.   5. CKD 3b: Creatinine stable at 1.15.  Continue ACE-I.  6. T2DM: A1C 8.2 Sept 2021. Management per PCP.   7. Disposition: No changes today. Encouraged participation in cardiac rehab. Return in 3 months to see Dr. [name].   Murray Hodgkins, NP 05/01/2020, 5:24 PM

## 2020-05-04 DIAGNOSIS — I129 Hypertensive chronic kidney disease with stage 1 through stage 4 chronic kidney disease, or unspecified chronic kidney disease: Secondary | ICD-10-CM | POA: Diagnosis not present

## 2020-05-04 DIAGNOSIS — N1832 Chronic kidney disease, stage 3b: Secondary | ICD-10-CM | POA: Diagnosis not present

## 2020-05-04 DIAGNOSIS — R809 Proteinuria, unspecified: Secondary | ICD-10-CM | POA: Diagnosis not present

## 2020-05-04 DIAGNOSIS — E1122 Type 2 diabetes mellitus with diabetic chronic kidney disease: Secondary | ICD-10-CM | POA: Diagnosis not present

## 2020-05-04 DIAGNOSIS — N2581 Secondary hyperparathyroidism of renal origin: Secondary | ICD-10-CM | POA: Diagnosis not present

## 2020-05-05 ENCOUNTER — Encounter: Payer: Self-pay | Admitting: Family Medicine

## 2020-05-05 DIAGNOSIS — F1190 Opioid use, unspecified, uncomplicated: Secondary | ICD-10-CM

## 2020-05-05 DIAGNOSIS — M48061 Spinal stenosis, lumbar region without neurogenic claudication: Secondary | ICD-10-CM

## 2020-05-07 MED ORDER — HYDROCODONE-ACETAMINOPHEN 5-325 MG PO TABS: 1.0000 | ORAL_TABLET | Freq: Four times a day (QID) | ORAL | 0 refills | Status: DC | PRN

## 2020-05-08 DIAGNOSIS — M75111 Incomplete rotator cuff tear or rupture of right shoulder, not specified as traumatic: Secondary | ICD-10-CM | POA: Diagnosis not present

## 2020-05-08 DIAGNOSIS — E1122 Type 2 diabetes mellitus with diabetic chronic kidney disease: Secondary | ICD-10-CM | POA: Diagnosis not present

## 2020-05-08 DIAGNOSIS — W109XXD Fall (on) (from) unspecified stairs and steps, subsequent encounter: Secondary | ICD-10-CM | POA: Diagnosis not present

## 2020-05-08 DIAGNOSIS — R2689 Other abnormalities of gait and mobility: Secondary | ICD-10-CM | POA: Diagnosis not present

## 2020-05-08 DIAGNOSIS — I129 Hypertensive chronic kidney disease with stage 1 through stage 4 chronic kidney disease, or unspecified chronic kidney disease: Secondary | ICD-10-CM | POA: Diagnosis not present

## 2020-05-08 DIAGNOSIS — N189 Chronic kidney disease, unspecified: Secondary | ICD-10-CM | POA: Diagnosis not present

## 2020-05-11 ENCOUNTER — Ambulatory Visit: Payer: Medicare Other | Admitting: Cardiovascular Disease

## 2020-05-11 DIAGNOSIS — E1122 Type 2 diabetes mellitus with diabetic chronic kidney disease: Secondary | ICD-10-CM | POA: Diagnosis not present

## 2020-05-11 DIAGNOSIS — R2689 Other abnormalities of gait and mobility: Secondary | ICD-10-CM | POA: Diagnosis not present

## 2020-05-11 DIAGNOSIS — M75111 Incomplete rotator cuff tear or rupture of right shoulder, not specified as traumatic: Secondary | ICD-10-CM | POA: Diagnosis not present

## 2020-05-11 DIAGNOSIS — W109XXD Fall (on) (from) unspecified stairs and steps, subsequent encounter: Secondary | ICD-10-CM | POA: Diagnosis not present

## 2020-05-11 DIAGNOSIS — I129 Hypertensive chronic kidney disease with stage 1 through stage 4 chronic kidney disease, or unspecified chronic kidney disease: Secondary | ICD-10-CM | POA: Diagnosis not present

## 2020-05-11 DIAGNOSIS — N189 Chronic kidney disease, unspecified: Secondary | ICD-10-CM | POA: Diagnosis not present

## 2020-05-12 DIAGNOSIS — I2510 Atherosclerotic heart disease of native coronary artery without angina pectoris: Secondary | ICD-10-CM | POA: Diagnosis not present

## 2020-05-12 DIAGNOSIS — Z87891 Personal history of nicotine dependence: Secondary | ICD-10-CM | POA: Diagnosis not present

## 2020-05-12 DIAGNOSIS — I129 Hypertensive chronic kidney disease with stage 1 through stage 4 chronic kidney disease, or unspecified chronic kidney disease: Secondary | ICD-10-CM | POA: Diagnosis not present

## 2020-05-12 DIAGNOSIS — Z794 Long term (current) use of insulin: Secondary | ICD-10-CM | POA: Diagnosis not present

## 2020-05-12 DIAGNOSIS — M75111 Incomplete rotator cuff tear or rupture of right shoulder, not specified as traumatic: Secondary | ICD-10-CM | POA: Diagnosis not present

## 2020-05-12 DIAGNOSIS — W109XXD Fall (on) (from) unspecified stairs and steps, subsequent encounter: Secondary | ICD-10-CM | POA: Diagnosis not present

## 2020-05-12 DIAGNOSIS — J449 Chronic obstructive pulmonary disease, unspecified: Secondary | ICD-10-CM | POA: Diagnosis not present

## 2020-05-12 DIAGNOSIS — R2689 Other abnormalities of gait and mobility: Secondary | ICD-10-CM | POA: Diagnosis not present

## 2020-05-12 DIAGNOSIS — E1122 Type 2 diabetes mellitus with diabetic chronic kidney disease: Secondary | ICD-10-CM | POA: Diagnosis not present

## 2020-05-12 DIAGNOSIS — N189 Chronic kidney disease, unspecified: Secondary | ICD-10-CM | POA: Diagnosis not present

## 2020-05-12 DIAGNOSIS — R531 Weakness: Secondary | ICD-10-CM | POA: Diagnosis not present

## 2020-05-19 DIAGNOSIS — M48061 Spinal stenosis, lumbar region without neurogenic claudication: Secondary | ICD-10-CM | POA: Diagnosis not present

## 2020-05-19 DIAGNOSIS — M5441 Lumbago with sciatica, right side: Secondary | ICD-10-CM | POA: Diagnosis not present

## 2020-05-19 DIAGNOSIS — G8929 Other chronic pain: Secondary | ICD-10-CM | POA: Diagnosis not present

## 2020-05-20 ENCOUNTER — Other Ambulatory Visit: Payer: Self-pay | Admitting: *Deleted

## 2020-05-20 MED ORDER — AMLODIPINE BESYLATE 5 MG PO TABS: 5.0000 mg | ORAL_TABLET | Freq: Every day | ORAL | 3 refills | Status: DC

## 2020-05-21 ENCOUNTER — Ambulatory Visit: Payer: Medicare Other

## 2020-05-21 DIAGNOSIS — E0821 Diabetes mellitus due to underlying condition with diabetic nephropathy: Secondary | ICD-10-CM

## 2020-05-21 DIAGNOSIS — I10 Essential (primary) hypertension: Secondary | ICD-10-CM

## 2020-05-27 DIAGNOSIS — R531 Weakness: Secondary | ICD-10-CM | POA: Diagnosis not present

## 2020-05-27 DIAGNOSIS — I129 Hypertensive chronic kidney disease with stage 1 through stage 4 chronic kidney disease, or unspecified chronic kidney disease: Secondary | ICD-10-CM | POA: Diagnosis not present

## 2020-05-27 DIAGNOSIS — M75111 Incomplete rotator cuff tear or rupture of right shoulder, not specified as traumatic: Secondary | ICD-10-CM | POA: Diagnosis not present

## 2020-05-27 DIAGNOSIS — N189 Chronic kidney disease, unspecified: Secondary | ICD-10-CM | POA: Diagnosis not present

## 2020-05-27 DIAGNOSIS — E1122 Type 2 diabetes mellitus with diabetic chronic kidney disease: Secondary | ICD-10-CM | POA: Diagnosis not present

## 2020-05-27 DIAGNOSIS — R2689 Other abnormalities of gait and mobility: Secondary | ICD-10-CM | POA: Diagnosis not present

## 2020-05-29 NOTE — Chronic Care Management (AMB) (Signed)
  Chronic Care Management   Note   Name: ALYSCIA CARMON MRN: [id number] DOB: 1945/03/13   Brief outreach with Ms. Nadene Rubins. Reports doing well but needs to complete outreach on a later date. Anticipates availability on 06/05/20. Agreed to call if needed prior to the rescheduled outreach.   Follow up plan: Will follow up as requested on 06/05/20.   Cristy [facility] [phone]

## 2020-06-02 DIAGNOSIS — M75101 Unspecified rotator cuff tear or rupture of right shoulder, not specified as traumatic: Secondary | ICD-10-CM | POA: Diagnosis not present

## 2020-06-05 ENCOUNTER — Ambulatory Visit (INDEPENDENT_AMBULATORY_CARE_PROVIDER_SITE_OTHER): Payer: Medicare Other

## 2020-06-05 DIAGNOSIS — N189 Chronic kidney disease, unspecified: Secondary | ICD-10-CM | POA: Diagnosis not present

## 2020-06-05 DIAGNOSIS — E1121 Type 2 diabetes mellitus with diabetic nephropathy: Secondary | ICD-10-CM

## 2020-06-05 DIAGNOSIS — I10 Essential (primary) hypertension: Secondary | ICD-10-CM

## 2020-06-05 DIAGNOSIS — R2689 Other abnormalities of gait and mobility: Secondary | ICD-10-CM | POA: Diagnosis not present

## 2020-06-05 DIAGNOSIS — Z794 Long term (current) use of insulin: Secondary | ICD-10-CM | POA: Diagnosis not present

## 2020-06-05 DIAGNOSIS — E1122 Type 2 diabetes mellitus with diabetic chronic kidney disease: Secondary | ICD-10-CM | POA: Diagnosis not present

## 2020-06-05 DIAGNOSIS — M75111 Incomplete rotator cuff tear or rupture of right shoulder, not specified as traumatic: Secondary | ICD-10-CM | POA: Diagnosis not present

## 2020-06-05 DIAGNOSIS — I5032 Chronic diastolic (congestive) heart failure: Secondary | ICD-10-CM | POA: Diagnosis not present

## 2020-06-05 DIAGNOSIS — I129 Hypertensive chronic kidney disease with stage 1 through stage 4 chronic kidney disease, or unspecified chronic kidney disease: Secondary | ICD-10-CM | POA: Diagnosis not present

## 2020-06-05 DIAGNOSIS — R531 Weakness: Secondary | ICD-10-CM | POA: Diagnosis not present

## 2020-06-05 DIAGNOSIS — Z9181 History of falling: Secondary | ICD-10-CM

## 2020-06-05 NOTE — Chronic Care Management (AMB) (Signed)
Chronic Care Management   Follow Up Note   06/05/2020 Name: Hannah Kirby MRN: [id number] DOB: 01-Jan-1946  Primary Care Provider: Birdie Sons, MD Reason for referral : Chronic Care Management   Hannah Kirby is a 75 y.o. year old female who is a primary care patient of Fisher, Kirstie Peri, MD. The CCM team was consulted for assistance with chronic disease management and care coordination.  Review of Hannah Kirby' status, including review of consultants reports, relevant labs and test results was conducted today. Collaboration with appropriate care team members was performed as part of the comprehensive evaluation and provision of chronic care management services.    SDOH (Social Determinants of Health) assessments performed: No     Outpatient Encounter Medications as of 06/05/2020  Medication Sig  . acetaminophen (TYLENOL) 500 MG tablet Take 500 mg by mouth every 6 (six) hours as needed.  Marland Kitchen albuterol (VENTOLIN HFA) 108 (90 Base) MCG/ACT inhaler Inhale 2 puffs into the lungs every 4 (four) hours as needed for wheezing or shortness of breath.  Marland Kitchen amLODipine (NORVASC) 5 MG tablet Take 1 tablet (5 mg total) by mouth daily.  Marland Kitchen aspirin (ASPIR-81) 81 MG EC tablet Take 81 mg by mouth daily.  . clopidogrel (PLAVIX) 75 MG tablet Take 1 tablet (75 mg total) by mouth daily.  . cyanocobalamin 1000 MCG tablet Take 1,000 mcg by mouth daily.  Marland Kitchen ezetimibe (ZETIA) 10 MG tablet Take 1 tablet (10 mg total) by mouth daily.  . fluticasone furoate-vilanterol (BREO ELLIPTA) 100-25 MCG/INH AEPB Inhale 1 puff into the lungs daily.  Marland Kitchen HYDROcodone-acetaminophen (NORCO/VICODIN) 5-325 MG tablet Take 1 tablet by mouth every 6 (six) hours as needed.  . insulin glargine (LANTUS) 100 UNIT/ML injection Inject 22 Units into the skin daily.   Marland Kitchen lisinopril (ZESTRIL) 20 MG tablet Take 1 tablet (20 mg total) by mouth daily.  . metoprolol tartrate (LOPRESSOR) 100 MG tablet Take 1 tablet (100 mg total) by mouth 2 (two)  times daily.  . Multiple Vitamins-Minerals (DAILY MULTI) TABS Take 1 tablet by mouth daily.  . nitroGLYCERIN (NITROSTAT) 0.4 MG SL tablet Place 1 tablet (0.4 mg total) under the tongue every 5 (five) minutes as needed.  . Omega-3 Fatty Acids (FISH OIL) 1000 MG CAPS Take 2 capsules by mouth in the morning and at bedtime.   . ondansetron (ZOFRAN ODT) 4 MG disintegrating tablet Take 1 tablet (4 mg total) by mouth every 8 (eight) hours as needed for nausea or vomiting.  . RABEprazole (ACIPHEX) 20 MG tablet Take 1 tablet (20 mg total) by mouth daily. 15 Mins. before evening meal  . rosuvastatin (CRESTOR) 10 MG tablet Take 1 tablet (10 mg total) by mouth daily.  . Semaglutide,0.25 or 0.5MG/DOS, 2 MG/1.5ML SOPN Inject 0.5 mg into the skin once a week.  . sucralfate (CARAFATE) 1 g tablet Take 1 tablet (1 g total) by mouth 2 (two) times daily. 15 Minutes before evening meal and at bed time  . tiotropium (SPIRIVA) 18 MCG inhalation capsule Place 1 capsule (18 mcg total) into inhaler and inhale daily.   No facility-administered encounter medications on file as of 06/05/2020.       Objective:  Patient Care Plan: Heart Failure (Adult)  Problem Identified: Symptom Exacerbation (Heart Failure)     Long-Range Goal: Symptom Exacerbation Prevented or Minimized   Start Date: 04/01/2020  Expected End Date: 07/30/2020  Priority: High  Note:   Wt Readings from Last 3 Encounters:  05/01/20 165 lb (74.8 kg)  04/01/20 166 lb (75.3 kg)  03/31/20 171 lb (77.6 kg)     Current Barriers:  . Chronic Disease Management needs and support r/t Heart Failure  Nurse Case Manager Clinical Goal(s):  Over the next 120 days, patient will not require hospitalization or emergent care d/t complications r/t Heart Failure exacerbation.  Interventions:  . Collaboration with Birdie Sons, MD regarding development and update of comprehensive plan of care as evidenced by provider attestation and  co-signature . Inter-disciplinary care team collaboration (see longitudinal plan of care) . Reviewed medications and discussed current treatment plan. Continues to take medications as prescribed. She continues to experience exertional dyspnea. Denies shortness of breath at rest. Denies episodes of chest pain or palpitations. Denies abdominal or lower extremity edema. Reports weights have remained  . Reviewed s/sx of exacerbation along with indications for seeking immediate medical attention.  Patient Goals/Self-Care Activities -Complete Cardiology follow-up -Take medications as prescribed -Monitor and record weight.  -Notify Cardiologist or PCP for weight gain outside of established parameters. -Adhere to a low sodium diet and follow Heart failure plan as advised by the Cardiology team.   Follow Up Plan:  Will update in two months      Patient Care Plan: Diabetes Type 2 (Adult)  Problem Identified: Glycemic Management (Diabetes, Type 2)     Long-Range Goal: Glycemic Management Optimized   Start Date: 04/01/2020  Expected End Date: 07/30/2020  Priority: High  Note:   Objective:  Lab Results  Component Value Date   HGBA1C 8.2 (A) 11/20/2019 .   Lab Results  Component Value Date   CREATININE 1.15 (H) 03/31/2020   CREATININE 1.04 (H) 03/25/2020   CREATININE 1.45 (H) 03/23/2020 .   Marland Kitchen No results found for: EGFR  Current Barriers:  . Chronic Disease Management needs and support related to Diabetes self-management   Case Manager Clinical Goal(s):  Marland Kitchen Over the next 120 days, patient will demonstrate adherence to prescribed treatment plan for Diabetes self management as evidenced by daily monitoring and recording of CBG, adherence to ADA/ carb modified diet and adherence to prescribed medication regimen.  Interventions:  . Collaboration with Birdie Sons, MD regarding development and update of comprehensive plan of care as evidenced by provider attestation and  co-signature . Inter-disciplinary care team collaboration (see longitudinal plan of care) . Reviewed blood glucose readings and compliance with current treatment plan. Reports compliance with medications. Fasting blood glucose readings have ranged from the 120's to 140's. Reports improvement with her nutritional intake and trying to adhere to the recommended diet. Encouraged to consistently monitor blood glucose levels and maintain a log.  . Reviewed s/sx of hypoglycemia and hyperglycemia along with recommended interventions.   Patient Goals/Self-Care Activities - Self administer oral medications and insulin as prescribed - Attend all scheduled provider appointments - Monitor blood glucose levels consistently and utilize recommended interventions - Adhere to prescribed ADA/carb modified    Follow Up Plan:  Will follow-up next month       Patient Care Plan: Hypertension (Adult)  Problem Identified: Hypertension (Hypertension)     Long-Range Goal: Hypertension Monitored   Start Date: 04/01/2020  Expected End Date: 07/30/2020  Priority: High  Note:   Objective:  . Last practice recorded BP readings:  BP Readings from Last 3 Encounters:  05/01/20 134/85  04/01/20 128/80  03/31/20 131/85     Current Barriers:  . Chronic Disease Management needs and support r/t Hypertension self-management  Case Manager Clinical Goal(s):  Marland Kitchen Over the next 120  days, patient will demonstrate adherence to prescribed treatment plan for hypertension as evidenced by taking all medications as prescribed, monitoring and recording blood pressure as directed and adhering to a cardiac prudent diet.  Interventions:  . Collaboration with Birdie Sons, MD regarding development and update of comprehensive plan of care as evidenced by provider attestation and co-signature . Inter-disciplinary care team collaboration (see longitudinal plan of care) . Reviewed plan for hypertension management. Reviewed  established blood pressure parameters and indications for notifying a provider. She is currently receiving home health services and reports her BP readings have been within range. Reports taking all cardiac medications as prescribed and improved adherence to a cardiac prudent/heart healthy diet. . Reviewed s/sx and complications r/t uncontrolled blood pressure and indications for seeking immediate medical attention.   Patient Goals/Self-Care Activities - Self administer medications as prescribed - Attend all scheduled provider appointments - Monitor and record blood pressure as discussed - Adhere to recommended cardiac prudent diet. - Call provider office for new concerns or BP outside of established parameters   Follow Up Plan:  Will update in two months      Patient Care Plan: Fall Risk (Adult)  Problem Identified: Fall Risk     Long-Range Goal: Absence of Fall and Fall-Related Injury   Start Date: 04/01/2020  Expected End Date: 08/04/2020  This Visit's Progress: Not on track  Priority: High  Note:   Fall Risk  06/05/2020 04/01/2020 02/18/2020 11/20/2019 08/16/2019  Falls in the past year? _0 0 0  Number falls in past yr: 1 0 0 - 0  Comment Reports fall earlier this week - - - -  Injury with Fall? - 1 1 - 0  Risk for fall due to : History of fall(s);Medication side effect History of fall(s);Medication side effect - - -  Follow up Falls prevention discussed Falls prevention discussed Falls prevention discussed - Falls evaluation completed     Current Barriers:  Marland Kitchen Moderate risk for Falls d/t fall history and medication side effects.  Clinical Goal(s): (Reset) . Over the next 60 days, patient will not experience falls. . Over the next 60 days, patient will continue to work with the [facility] team and utilize recommended fall prevention strategies.  Interventions:  . Collaboration with Birdie Sons, MD regarding development and update of comprehensive plan  of care as evidenced by provider attestation and co-signature . Inter-disciplinary care team collaboration (see longitudinal plan of care) . Assessed for falls since the last outreach. Reports falling earlier this week d/t losing her balance while attempting to get on an exam table. Reports minimal bruising to her shoulder and buttocks.  Denies injuries and reports being examined by a provider immediately after the fall. She remains engaged with the [facility] therapy team. Services were extended for approximately 4-6 weeks. . Reviewed information regarding safety and fall prevention measures. Discussed importance of using safety devices and keeping a cane/walker readily available to use when needed. Reports using devices as advised. . Discussed ability to perform ADL's and tasks in the home. Reports doing well with self care and continues to perform ADL's independently. Reports family members are available to assist if needed. Declined need for additional assistance in the home.   Patient Goals/Self-Care Activities   -Utilize assistive appropriately with all ambulation -Change positions slowly -Wear secure non-skid footwear at all times with ambulation -Ensure pathways are clear and utilize home lighting for dim lit areas    Follow Up  Plan:  Will follow up next month       PLAN A member of the care management team will follow up with  Hannah Kirby next month.    [facility] Care Management [facility] [phone]

## 2020-06-05 NOTE — Patient Instructions (Addendum)
Thank you for allowing the Chronic Care Management team to participate in your care. It was a pleasure speaking with you today. Please feel free to contact me with questions.  Goals Addressed: Patient Care Plan: Heart Failure (Adult)  Problem Identified: Symptom Exacerbation (Heart Failure)     Long-Range Goal: Symptom Exacerbation Prevented or Minimized   Start Date: 04/01/2020  Expected End Date: 07/30/2020  Priority: High  Note:   Wt Readings from Last 3 Encounters:  05/01/20 165 lb (74.8 kg)  04/01/20 166 lb (75.3 kg)  03/31/20 171 lb (77.6 kg)     Current Barriers:  . Chronic Disease Management needs and support r/t Heart Failure  Nurse Case Manager Clinical Goal(s):  Over the next 120 days, patient will not require hospitalization or emergent care d/t complications r/t Heart Failure exacerbation.  Interventions:  . Collaboration with Birdie Sons, MD regarding development and update of comprehensive plan of care as evidenced by provider attestation and co-signature . Inter-disciplinary care team collaboration (see longitudinal plan of care) . Reviewed medications and discussed current treatment plan. Continues to take medications as prescribed. She continues to experience exertional dyspnea. Denies shortness of breath at rest. Denies episodes of chest pain or palpitations. Denies abdominal or lower extremity edema. Reports weights have remained  . Reviewed s/sx of exacerbation along with indications for seeking immediate medical attention.  Patient Goals/Self-Care Activities -Complete Cardiology follow-up -Take medications as prescribed -Monitor and record weight.  -Notify Cardiologist or PCP for weight gain outside of established parameters. -Adhere to a low sodium diet and follow Heart failure plan as advised by the Cardiology team.   Follow Up Plan:  Will update in two months      Patient Care Plan: Diabetes Type 2 (Adult)  Problem Identified: Glycemic  Management (Diabetes, Type 2)     Long-Range Goal: Glycemic Management Optimized   Start Date: 04/01/2020  Expected End Date: 07/30/2020  Priority: High  Note:   Objective:  Lab Results  Component Value Date   HGBA1C 8.2 (A) 11/20/2019 .   Lab Results  Component Value Date   CREATININE 1.15 (H) 03/31/2020   CREATININE 1.04 (H) 03/25/2020   CREATININE 1.45 (H) 03/23/2020 .   Marland Kitchen No results found for: EGFR  Current Barriers:  . Chronic Disease Management needs and support related to Diabetes self-management   Case Manager Clinical Goal(s):  Marland Kitchen Over the next 120 days, patient will demonstrate adherence to prescribed treatment plan for Diabetes self management as evidenced by daily monitoring and recording of CBG, adherence to ADA/ carb modified diet and adherence to prescribed medication regimen.  Interventions:  . Collaboration with Birdie Sons, MD regarding development and update of comprehensive plan of care as evidenced by provider attestation and co-signature . Inter-disciplinary care team collaboration (see longitudinal plan of care) . Reviewed blood glucose readings and compliance with current treatment plan. Reports compliance with medications. Fasting blood glucose readings have ranged from the 120's to 140's. Reports improvement with her nutritional intake and trying to adhere to the recommended diet. Encouraged to consistently monitor blood glucose levels and maintain a log.  . Reviewed s/sx of hypoglycemia and hyperglycemia along with recommended interventions.   Patient Goals/Self-Care Activities - Self administer oral medications and insulin as prescribed - Attend all scheduled provider appointments - Monitor blood glucose levels consistently and utilize recommended interventions - Adhere to prescribed ADA/carb modified    Follow Up Plan:  Will follow-up next month       Patient  Care Plan: Hypertension (Adult)  Problem Identified: Hypertension (Hypertension)      Long-Range Goal: Hypertension Monitored   Start Date: 04/01/2020  Expected End Date: 07/30/2020  Priority: High  Note:   Objective:  . Last practice recorded BP readings:  BP Readings from Last 3 Encounters:  05/01/20 134/85  04/01/20 128/80  03/31/20 131/85     Current Barriers:  . Chronic Disease Management needs and support r/t Hypertension self-management  Case Manager Clinical Goal(s):  Marland Kitchen Over the next 120 days, patient will demonstrate adherence to prescribed treatment plan for hypertension as evidenced by taking all medications as prescribed, monitoring and recording blood pressure as directed and adhering to a cardiac prudent diet.  Interventions:  . Collaboration with Birdie Sons, MD regarding development and update of comprehensive plan of care as evidenced by provider attestation and co-signature . Inter-disciplinary care team collaboration (see longitudinal plan of care) . Reviewed plan for hypertension management. Reviewed established blood pressure parameters and indications for notifying a provider. She is currently receiving home health services and reports her BP readings have been within range. Reports taking all cardiac medications as prescribed and improved adherence to a cardiac prudent/heart healthy diet. . Reviewed s/sx and complications r/t uncontrolled blood pressure and indications for seeking immediate medical attention.   Patient Goals/Self-Care Activities - Self administer medications as prescribed - Attend all scheduled provider appointments - Monitor and record blood pressure as discussed - Adhere to recommended cardiac prudent diet. - Call provider office for new concerns or BP outside of established parameters   Follow Up Plan:  Will update in two months      Patient Care Plan: Fall Risk (Adult)  Problem Identified: Fall Risk     Long-Range Goal: Absence of Fall and Fall-Related Injury   Start Date: 04/01/2020  Expected End Date:  08/04/2020  This Visit's Progress: Not on track  Priority: High  Note:   Fall Risk  06/05/2020 04/01/2020 02/18/2020 11/20/2019 08/16/2019  Falls in the past year? 1 1 1  0 0  Number falls in past yr: 1 0 0 - 0  Comment Reports fall earlier this week - - - -  Injury with Fall? - 1 1 - 0  Risk for fall due to : History of fall(s);Medication side effect History of fall(s);Medication side effect - - -  Follow up Falls prevention discussed Falls prevention discussed Falls prevention discussed - Falls evaluation completed     Current Barriers:  Marland Kitchen Moderate risk for Falls d/t fall history and medication side effects.  Clinical Goal(s): (Reset) . Over the next 60 days, patient will not experience falls. . Over the next 60 days, patient will continue to work with the [place] team and utilize recommended fall prevention strategies.  Interventions:  . Collaboration with Birdie Sons, MD regarding development and update of comprehensive plan of care as evidenced by provider attestation and co-signature . Inter-disciplinary care team collaboration (see longitudinal plan of care) . Assessed for falls since the last outreach. Reports falling earlier this week d/t losing her balance while attempting to get on an exam table. Reports minimal bruising to her shoulder and buttocks.  Denies injuries and reports being examined by a provider immediately after the fall. She remains engaged with the [facility] therapy team. Services were extended for approximately 4-6 weeks. . Reviewed information regarding safety and fall prevention measures. Discussed importance of using safety devices and keeping a cane/walker readily available to use when needed. Reports  using devices as advised. . Discussed ability to perform ADL's and tasks in the home. Reports doing well with self care and continues to perform ADL's independently. Reports family members are available to assist if needed. Declined  need for additional assistance in the home.   Patient Goals/Self-Care Activities   -Utilize assistive appropriately with all ambulation -Change positions slowly -Wear secure non-skid footwear at all times with ambulation -Ensure pathways are clear and utilize home lighting for dim lit areas    Follow Up Plan:  Will follow up next month        Hannah Kirby verbalized understanding of the information discussed during the telephonic outreach today. Declined need for mailed/printed instructions. A member of the care management team will follow up next month.    [facility] Care Management [facility] [phone]

## 2020-06-08 ENCOUNTER — Encounter: Payer: Self-pay | Admitting: Family Medicine

## 2020-06-08 DIAGNOSIS — R531 Weakness: Secondary | ICD-10-CM | POA: Diagnosis not present

## 2020-06-08 DIAGNOSIS — R2689 Other abnormalities of gait and mobility: Secondary | ICD-10-CM | POA: Diagnosis not present

## 2020-06-08 DIAGNOSIS — M75111 Incomplete rotator cuff tear or rupture of right shoulder, not specified as traumatic: Secondary | ICD-10-CM | POA: Diagnosis not present

## 2020-06-08 DIAGNOSIS — I129 Hypertensive chronic kidney disease with stage 1 through stage 4 chronic kidney disease, or unspecified chronic kidney disease: Secondary | ICD-10-CM | POA: Diagnosis not present

## 2020-06-08 DIAGNOSIS — E1122 Type 2 diabetes mellitus with diabetic chronic kidney disease: Secondary | ICD-10-CM | POA: Diagnosis not present

## 2020-06-08 DIAGNOSIS — M48061 Spinal stenosis, lumbar region without neurogenic claudication: Secondary | ICD-10-CM

## 2020-06-08 DIAGNOSIS — F1190 Opioid use, unspecified, uncomplicated: Secondary | ICD-10-CM

## 2020-06-08 DIAGNOSIS — N189 Chronic kidney disease, unspecified: Secondary | ICD-10-CM | POA: Diagnosis not present

## 2020-06-08 MED ORDER — HYDROCODONE-ACETAMINOPHEN 5-325 MG PO TABS: 1.0000 | ORAL_TABLET | Freq: Four times a day (QID) | ORAL | 0 refills | Status: DC | PRN

## 2020-06-11 DIAGNOSIS — N189 Chronic kidney disease, unspecified: Secondary | ICD-10-CM | POA: Diagnosis not present

## 2020-06-11 DIAGNOSIS — I129 Hypertensive chronic kidney disease with stage 1 through stage 4 chronic kidney disease, or unspecified chronic kidney disease: Secondary | ICD-10-CM | POA: Diagnosis not present

## 2020-06-11 DIAGNOSIS — J449 Chronic obstructive pulmonary disease, unspecified: Secondary | ICD-10-CM | POA: Diagnosis not present

## 2020-06-11 DIAGNOSIS — E1122 Type 2 diabetes mellitus with diabetic chronic kidney disease: Secondary | ICD-10-CM | POA: Diagnosis not present

## 2020-06-11 DIAGNOSIS — I2510 Atherosclerotic heart disease of native coronary artery without angina pectoris: Secondary | ICD-10-CM | POA: Diagnosis not present

## 2020-06-11 DIAGNOSIS — Z794 Long term (current) use of insulin: Secondary | ICD-10-CM | POA: Diagnosis not present

## 2020-06-11 DIAGNOSIS — W109XXD Fall (on) (from) unspecified stairs and steps, subsequent encounter: Secondary | ICD-10-CM | POA: Diagnosis not present

## 2020-06-11 DIAGNOSIS — Z87891 Personal history of nicotine dependence: Secondary | ICD-10-CM | POA: Diagnosis not present

## 2020-06-11 DIAGNOSIS — M75111 Incomplete rotator cuff tear or rupture of right shoulder, not specified as traumatic: Secondary | ICD-10-CM | POA: Diagnosis not present

## 2020-06-11 DIAGNOSIS — R531 Weakness: Secondary | ICD-10-CM | POA: Diagnosis not present

## 2020-06-11 DIAGNOSIS — R2689 Other abnormalities of gait and mobility: Secondary | ICD-10-CM | POA: Diagnosis not present

## 2020-06-17 DIAGNOSIS — R2689 Other abnormalities of gait and mobility: Secondary | ICD-10-CM | POA: Diagnosis not present

## 2020-06-17 DIAGNOSIS — I129 Hypertensive chronic kidney disease with stage 1 through stage 4 chronic kidney disease, or unspecified chronic kidney disease: Secondary | ICD-10-CM | POA: Diagnosis not present

## 2020-06-17 DIAGNOSIS — M75111 Incomplete rotator cuff tear or rupture of right shoulder, not specified as traumatic: Secondary | ICD-10-CM | POA: Diagnosis not present

## 2020-06-17 DIAGNOSIS — R531 Weakness: Secondary | ICD-10-CM | POA: Diagnosis not present

## 2020-06-17 DIAGNOSIS — E1122 Type 2 diabetes mellitus with diabetic chronic kidney disease: Secondary | ICD-10-CM | POA: Diagnosis not present

## 2020-06-17 DIAGNOSIS — N189 Chronic kidney disease, unspecified: Secondary | ICD-10-CM | POA: Diagnosis not present

## 2020-06-24 DIAGNOSIS — N189 Chronic kidney disease, unspecified: Secondary | ICD-10-CM | POA: Diagnosis not present

## 2020-06-24 DIAGNOSIS — E1122 Type 2 diabetes mellitus with diabetic chronic kidney disease: Secondary | ICD-10-CM | POA: Diagnosis not present

## 2020-06-24 DIAGNOSIS — R531 Weakness: Secondary | ICD-10-CM | POA: Diagnosis not present

## 2020-06-24 DIAGNOSIS — R2689 Other abnormalities of gait and mobility: Secondary | ICD-10-CM | POA: Diagnosis not present

## 2020-06-24 DIAGNOSIS — M75111 Incomplete rotator cuff tear or rupture of right shoulder, not specified as traumatic: Secondary | ICD-10-CM | POA: Diagnosis not present

## 2020-06-24 DIAGNOSIS — I129 Hypertensive chronic kidney disease with stage 1 through stage 4 chronic kidney disease, or unspecified chronic kidney disease: Secondary | ICD-10-CM | POA: Diagnosis not present

## 2020-07-10 DIAGNOSIS — R5383 Other fatigue: Secondary | ICD-10-CM | POA: Diagnosis not present

## 2020-07-10 DIAGNOSIS — Z8673 Personal history of transient ischemic attack (TIA), and cerebral infarction without residual deficits: Secondary | ICD-10-CM | POA: Diagnosis not present

## 2020-07-10 DIAGNOSIS — R5381 Other malaise: Secondary | ICD-10-CM | POA: Diagnosis not present

## 2020-07-10 DIAGNOSIS — R270 Ataxia, unspecified: Secondary | ICD-10-CM | POA: Diagnosis not present

## 2020-07-10 DIAGNOSIS — R519 Headache, unspecified: Secondary | ICD-10-CM | POA: Diagnosis not present

## 2020-07-10 DIAGNOSIS — R531 Weakness: Secondary | ICD-10-CM | POA: Diagnosis not present

## 2020-07-13 DIAGNOSIS — E119 Type 2 diabetes mellitus without complications: Secondary | ICD-10-CM | POA: Diagnosis not present

## 2020-07-13 LAB — HM DIABETES EYE EXAM

## 2020-07-14 ENCOUNTER — Ambulatory Visit (INDEPENDENT_AMBULATORY_CARE_PROVIDER_SITE_OTHER): Payer: Medicare Other

## 2020-07-14 DIAGNOSIS — I10 Essential (primary) hypertension: Secondary | ICD-10-CM

## 2020-07-14 DIAGNOSIS — E1121 Type 2 diabetes mellitus with diabetic nephropathy: Secondary | ICD-10-CM | POA: Diagnosis not present

## 2020-07-14 DIAGNOSIS — Z794 Long term (current) use of insulin: Secondary | ICD-10-CM | POA: Diagnosis not present

## 2020-07-14 NOTE — Chronic Care Management (AMB) (Signed)
Chronic Care Management   Follow Up Note   07/14/2020 Name: Hannah Kirby MRN: [id number] DOB: 1945/03/07  Primary Care Provider: Birdie Sons, MD Reason for referral : Chronic Care Management   Hannah Kirby is a 75 y.o. year old female who is a primary care patient of Birdie Sons, MD. She is currently enrolled in the Chronic Care Management program. A routine telephonic outreach was conducted today.  Review of Mrs. Bokhari' status, including review of consultants reports, relevant labs and test results was conducted today. Collaboration with appropriate care team members was performed as part of the comprehensive evaluation and provision of chronic care management services.    SDOH (Social Determinants of Health) assessments performed: No     Outpatient Encounter Medications as of 07/14/2020  Medication Sig  . acetaminophen (TYLENOL) 500 MG tablet Take 500 mg by mouth every 6 (six) hours as needed.  Marland Kitchen albuterol (VENTOLIN HFA) 108 (90 Base) MCG/ACT inhaler Inhale 2 puffs into the lungs every 4 (four) hours as needed for wheezing or shortness of breath.  Marland Kitchen amLODipine (NORVASC) 5 MG tablet Take 1 tablet (5 mg total) by mouth daily.  Marland Kitchen aspirin (ASPIR-81) 81 MG EC tablet Take 81 mg by mouth daily.  . clopidogrel (PLAVIX) 75 MG tablet Take 1 tablet (75 mg total) by mouth daily.  . cyanocobalamin 1000 MCG tablet Take 1,000 mcg by mouth daily.  Marland Kitchen ezetimibe (ZETIA) 10 MG tablet Take 1 tablet (10 mg total) by mouth daily.  . fluticasone furoate-vilanterol (BREO ELLIPTA) 100-25 MCG/INH AEPB Inhale 1 puff into the lungs daily.  Marland Kitchen HYDROcodone-acetaminophen (NORCO/VICODIN) 5-325 MG tablet Take 1 tablet by mouth every 6 (six) hours as needed.  . insulin glargine (LANTUS) 100 UNIT/ML injection Inject 22 Units into the skin daily.   Marland Kitchen lisinopril (ZESTRIL) 20 MG tablet Take 1 tablet (20 mg total) by mouth daily.  . metoprolol tartrate (LOPRESSOR) 100 MG tablet Take 1 tablet (100 mg  total) by mouth 2 (two) times daily.  . Multiple Vitamins-Minerals (DAILY MULTI) TABS Take 1 tablet by mouth daily.  . nitroGLYCERIN (NITROSTAT) 0.4 MG SL tablet Place 1 tablet (0.4 mg total) under the tongue every 5 (five) minutes as needed.  . Omega-3 Fatty Acids (FISH OIL) 1000 MG CAPS Take 2 capsules by mouth in the morning and at bedtime.   . ondansetron (ZOFRAN ODT) 4 MG disintegrating tablet Take 1 tablet (4 mg total) by mouth every 8 (eight) hours as needed for nausea or vomiting.  . RABEprazole (ACIPHEX) 20 MG tablet Take 1 tablet (20 mg total) by mouth daily. 15 Mins. before evening meal  . rosuvastatin (CRESTOR) 10 MG tablet Take 1 tablet (10 mg total) by mouth daily.  . Semaglutide,0.25 or 0.5MG/DOS, 2 MG/1.5ML SOPN Inject 0.5 mg into the skin once a week.  . sucralfate (CARAFATE) 1 g tablet Take 1 tablet (1 g total) by mouth 2 (two) times daily. 15 Minutes before evening meal and at bed time  . tiotropium (SPIRIVA) 18 MCG inhalation capsule Place 1 capsule (18 mcg total) into inhaler and inhale daily.   No facility-administered encounter medications on file as of 07/14/2020.     Objective:  Patient Care Plan: Diabetes Type 2 (Adult)  Problem Identified: Glycemic Management (Diabetes, Type 2)     Long-Range Goal: Glycemic Management Optimized   Start Date: 04/01/2020  Expected End Date: 07/30/2020  Priority: High  Note:    Current Barriers:  . Chronic Disease Management needs and support  related to Diabetes self-management   Case Manager Clinical Goal(s):  Marland Kitchen Over the next 120 days, patient will demonstrate adherence to prescribed treatment plan for Diabetes self management as evidenced by daily monitoring and recording of CBG, adherence to ADA/ carb modified diet and adherence to prescribed medication regimen.  Interventions:  . Collaboration with Birdie Sons, MD regarding development and update of comprehensive plan of care as evidenced by provider attestation and  co-signature . Inter-disciplinary care team collaboration (see longitudinal plan of care) . Reviewed blood glucose readings and compliance with current treatment plan. Continues to take medications as prescribed and monitor blood glucose levels as advised. She notes one elevated reading in the 150's but reports most fasting reading have been within range. Recent reading was 113 mg/dl. Reports her nutritional intake continues to improve. Notes less snacking during throughout the day. Encouraged to continue monitoring carb intake and reading nutritional labels. Encouraged to continue monitoring and recording blood glucose levels.  . Reviewed s/sx of hypoglycemia and hyperglycemia along with recommended interventions.   Patient Goals/Self-Care Activities - Self administer oral medications and insulin as prescribed - Attend all scheduled provider appointments - Monitor blood glucose levels consistently and utilize recommended interventions - Adhere to prescribed ADA/carb modified    Follow Up Plan:  Will follow-up next month         Patient Care Plan: Hypertension (Adult)    Problem Identified: Hypertension (Hypertension)     Long-Range Goal: Hypertension Monitored Completed 07/14/2020  Start Date: 04/01/2020  Expected End Date: 07/30/2020  This Visit's Progress: On track  Priority: High  Note:    Current Barriers:  . Chronic Disease Management needs and support r/t Hypertension self-management  Case Manager Clinical Goal(s):  Marland Kitchen Over the next 120 days, patient will demonstrate adherence to prescribed treatment plan for hypertension as evidenced by taking all medications as prescribed, monitoring and recording blood pressure as directed and adhering to a cardiac prudent diet.  Interventions:  . Collaboration with Birdie Sons, MD regarding development and update of comprehensive plan of care as evidenced by provider attestation and co-signature . Inter-disciplinary care team  collaboration (see longitudinal plan of care) . Reviewed plan for hypertension management and established blood pressure parameters. Reports excellent medication compliance. Reports BP readings have been within range with systolic range in the 071'Q and 130's. Diastolic readings have ranged in the 70's and 80's. Reports doing well with maintaining a cardiac prudent/heart healthy diet. Her activity/exercise is somewhat restricted d/t occasional imbalance but reports tolerating mild activity without concerns. Encouraged to continue monitoring and recording readings.  . Reviewed s/sx and complications r/t uncontrolled blood pressure and indications for seeking immediate medical attention.   Patient Goals/Self-Care Activities - Self administer medications as prescribed - Attend all scheduled provider appointments - Monitor and record blood pressure as discussed - Adhere to recommended cardiac prudent diet. - Call provider office for new concerns or BP outside of established parameters     Goal Met        PLAN A member of the care management team will follow up with Mrs. Vowles next month.     [facility]risty [facility] Care Management [facility] [phone]

## 2020-07-16 ENCOUNTER — Encounter: Payer: Self-pay | Admitting: Family Medicine

## 2020-07-16 DIAGNOSIS — M48061 Spinal stenosis, lumbar region without neurogenic claudication: Secondary | ICD-10-CM

## 2020-07-16 DIAGNOSIS — F1190 Opioid use, unspecified, uncomplicated: Secondary | ICD-10-CM

## 2020-07-17 MED ORDER — HYDROCODONE-ACETAMINOPHEN 5-325 MG PO TABS: 1.0000 | ORAL_TABLET | Freq: Four times a day (QID) | ORAL | 0 refills | Status: DC | PRN

## 2020-07-21 DIAGNOSIS — J984 Other disorders of lung: Secondary | ICD-10-CM | POA: Insufficient documentation

## 2020-07-21 NOTE — Patient Instructions (Addendum)
Thank you for allowing the Chronic Care Management team to participate in your care. It was a pleasure speaking with you today. Please feel free to contact me with questions.   Goals Addressed: Patient Care Plan: Diabetes Type 2 (Adult)  Problem Identified: Glycemic Management (Diabetes, Type 2)     Long-Range Goal: Glycemic Management Optimized   Start Date: 04/01/2020  Expected End Date: 07/30/2020  Priority: High  Note:    Current Barriers:  . Chronic Disease Management needs and support related to Diabetes self-management   Case Manager Clinical Goal(s):  Marland Kitchen Over the next 120 days, patient will demonstrate adherence to prescribed treatment plan for Diabetes self management as evidenced by daily monitoring and recording of CBG, adherence to ADA/ carb modified diet and adherence to prescribed medication regimen.  Interventions:  . Collaboration with Birdie Sons, MD regarding development and update of comprehensive plan of care as evidenced by provider attestation and co-signature . Inter-disciplinary care team collaboration (see longitudinal plan of care) . Reviewed blood glucose readings and compliance with current treatment plan. Continues to take medications as prescribed and monitor blood glucose levels as advised. She notes one elevated reading in the 150's but reports most fasting reading have been within range. Recent reading was 113 mg/dl. Reports her nutritional intake continues to improve. Notes less snacking during throughout the day. Encouraged to continue monitoring carb intake and reading nutritional labels. Encouraged to continue monitoring and recording blood glucose levels.  . Reviewed s/sx of hypoglycemia and hyperglycemia along with recommended interventions.   Patient Goals/Self-Care Activities - Self administer oral medications and insulin as prescribed - Attend all scheduled provider appointments - Monitor blood glucose levels consistently and utilize  recommended interventions - Adhere to prescribed ADA/carb modified    Follow Up Plan:  Will follow-up next month         Patient Care Plan: Hypertension (Adult)    Problem Identified: Hypertension (Hypertension)     Long-Range Goal: Hypertension Monitored Completed 07/14/2020  Start Date: 04/01/2020  Expected End Date: 07/30/2020  This Visit's Progress: On track  Priority: High  Note:    Current Barriers:  . Chronic Disease Management needs and support r/t Hypertension self-management  Case Manager Clinical Goal(s):  Marland Kitchen Over the next 120 days, patient will demonstrate adherence to prescribed treatment plan for hypertension as evidenced by taking all medications as prescribed, monitoring and recording blood pressure as directed and adhering to a cardiac prudent diet.  Interventions:  . Collaboration with Birdie Sons, MD regarding development and update of comprehensive plan of care as evidenced by provider attestation and co-signature . Inter-disciplinary care team collaboration (see longitudinal plan of care) . Reviewed plan for hypertension management and established blood pressure parameters. Reports excellent medication compliance. Reports BP readings have been within range with systolic range in the 076'K and 130's. Diastolic readings have ranged in the 70's and 80's. Reports doing well with maintaining a cardiac prudent/heart healthy diet. Her activity/exercise is somewhat restricted d/t occasional imbalance but reports tolerating mild activity without concerns. Encouraged to continue monitoring and recording readings.  . Reviewed s/sx and complications r/t uncontrolled blood pressure and indications for seeking immediate medical attention.   Patient Goals/Self-Care Activities - Self administer medications as prescribed - Attend all scheduled provider appointments - Monitor and record blood pressure as discussed - Adhere to recommended cardiac prudent diet. - Call  provider office for new concerns or BP outside of established parameters     Goal Met  Mrs. Merrick's verbalized understanding of the information discussed during the telephonic outreach today. Declined need for mailed/printed instructions. A member of the care management team will follow up next month.     [facility] Care [facility] [phone]

## 2020-07-30 ENCOUNTER — Telehealth: Payer: Self-pay

## 2020-07-30 NOTE — Telephone Encounter (Signed)
Copied from [place] [id number]. Topic: General - Other >> Jul 30, 2020  2:06 PM Pawlus, Brayton Layman A wrote: Reason for CRM: Pt wanted a call back to discuss getting the Covid booster, pt wanted to know if she could get one at her upcoming appt.

## 2020-07-31 NOTE — Telephone Encounter (Signed)
Patient advised that she should be able to get the vaccine if its available on that day.

## 2020-08-03 DIAGNOSIS — L920 Granuloma annulare: Secondary | ICD-10-CM | POA: Diagnosis not present

## 2020-08-03 DIAGNOSIS — Z859 Personal history of malignant neoplasm, unspecified: Secondary | ICD-10-CM | POA: Diagnosis not present

## 2020-08-03 DIAGNOSIS — L570 Actinic keratosis: Secondary | ICD-10-CM | POA: Diagnosis not present

## 2020-08-03 DIAGNOSIS — L578 Other skin changes due to chronic exposure to nonionizing radiation: Secondary | ICD-10-CM | POA: Diagnosis not present

## 2020-08-03 DIAGNOSIS — L821 Other seborrheic keratosis: Secondary | ICD-10-CM | POA: Diagnosis not present

## 2020-08-03 DIAGNOSIS — Z86018 Personal history of other benign neoplasm: Secondary | ICD-10-CM | POA: Diagnosis not present

## 2020-08-04 ENCOUNTER — Encounter: Payer: Self-pay | Admitting: Family Medicine

## 2020-08-04 ENCOUNTER — Other Ambulatory Visit: Payer: Self-pay

## 2020-08-04 ENCOUNTER — Ambulatory Visit (INDEPENDENT_AMBULATORY_CARE_PROVIDER_SITE_OTHER): Payer: Medicare Other | Admitting: Family Medicine

## 2020-08-04 ENCOUNTER — Other Ambulatory Visit: Payer: Self-pay | Admitting: Family Medicine

## 2020-08-04 ENCOUNTER — Telehealth: Payer: Self-pay

## 2020-08-04 VITALS — BP 144/77 | HR 68 | Temp 97.7°F | Resp 16 | Wt 168.2 lb

## 2020-08-04 DIAGNOSIS — M48061 Spinal stenosis, lumbar region without neurogenic claudication: Secondary | ICD-10-CM | POA: Diagnosis not present

## 2020-08-04 DIAGNOSIS — I10 Essential (primary) hypertension: Secondary | ICD-10-CM | POA: Diagnosis not present

## 2020-08-04 DIAGNOSIS — E2839 Other primary ovarian failure: Secondary | ICD-10-CM

## 2020-08-04 DIAGNOSIS — I714 Abdominal aortic aneurysm, without rupture, unspecified: Secondary | ICD-10-CM

## 2020-08-04 DIAGNOSIS — F1190 Opioid use, unspecified, uncomplicated: Secondary | ICD-10-CM | POA: Diagnosis not present

## 2020-08-04 DIAGNOSIS — J449 Chronic obstructive pulmonary disease, unspecified: Secondary | ICD-10-CM

## 2020-08-04 DIAGNOSIS — I2510 Atherosclerotic heart disease of native coronary artery without angina pectoris: Secondary | ICD-10-CM | POA: Diagnosis not present

## 2020-08-04 DIAGNOSIS — Z23 Encounter for immunization: Secondary | ICD-10-CM

## 2020-08-04 DIAGNOSIS — N2581 Secondary hyperparathyroidism of renal origin: Secondary | ICD-10-CM

## 2020-08-04 NOTE — Patient Instructions (Signed)
The CDC recommends two doses of Shingrix (the shingles vaccine) separated by 2 to 6 months for adults age 74 years and older. I recommend checking with your pharmacy plan regarding coverage for this vaccine.  0 You are due for a Tdap (tetanus-diptheria-pertussis vaccine) which protects you from tetanus and whooping cough. Please check with your insurance plan or pharmacy regarding coverage for this vaccine.

## 2020-08-04 NOTE — Progress Notes (Signed)
Established patient visit   Patient: Hannah Kirby   DOB: 04/15/1945   75 y.o. Female  MRN: [id number] Visit Date: 08/04/2020  Today's healthcare provider: Lelon Huh, MD   Chief Complaint  Patient presents with   Follow-up   Subjective    HPI  Follow up for Atherosclerosis of native coronary artery of native heart with stable angina pectoris  The patient was last seen for this 4 months ago. Changes made at last visit include none,S/p PCI doing well post procedure. Needs DUAP therapy until February 2023. Marland Kitchen  She reports excellent compliance with treatment.  She is not having side effects.   -----------------------------------------------------------------------------------------  Follow up for Spinal Stenosis of lumbar region  The patient was last seen for this 4 months ago. Changes made at last visit include none, continue hydrocodone-acetaminophen.  She reports excellent compliance with treatment. She feels that condition is Unchanged. She is not having side effects.   -----------------------------------------------------------------------------------------     Medications: Outpatient Medications Prior to Visit  Medication Sig   acetaminophen (TYLENOL) 500 MG tablet Take 500 mg by mouth every 6 (six) hours as needed.   albuterol (VENTOLIN HFA) 108 (90 Base) MCG/ACT inhaler Inhale 2 puffs into the lungs every 4 (four) hours as needed for wheezing or shortness of breath.   amLODipine (NORVASC) 5 MG tablet Take 1 tablet (5 mg total) by mouth daily.   aspirin (ASPIR-81) 81 MG EC tablet Take 81 mg by mouth daily.   clopidogrel (PLAVIX) 75 MG tablet Take 1 tablet (75 mg total) by mouth daily.   cyanocobalamin 1000 MCG tablet Take 1,000 mcg by mouth daily.   ezetimibe (ZETIA) 10 MG tablet Take 1 tablet (10 mg total) by mouth daily.   fluticasone furoate-vilanterol (BREO ELLIPTA) 100-25 MCG/INH AEPB Inhale 1 puff into the lungs daily.   HYDROcodone-acetaminophen  (NORCO/VICODIN) 5-325 MG tablet Take 1 tablet by mouth every 6 (six) hours as needed.   insulin glargine (LANTUS) 100 UNIT/ML injection Inject 22 Units into the skin daily.    lisinopril (ZESTRIL) 20 MG tablet Take 1 tablet (20 mg total) by mouth daily.   metoprolol tartrate (LOPRESSOR) 100 MG tablet Take 1 tablet (100 mg total) by mouth 2 (two) times daily.   Multiple Vitamins-Minerals (DAILY MULTI) TABS Take 1 tablet by mouth daily.   nitroGLYCERIN (NITROSTAT) 0.4 MG SL tablet Place 1 tablet (0.4 mg total) under the tongue every 5 (five) minutes as needed.   Omega-3 Fatty Acids (FISH OIL) 1000 MG CAPS Take 2 capsules by mouth in the morning and at bedtime.    ondansetron (ZOFRAN ODT) 4 MG disintegrating tablet Take 1 tablet (4 mg total) by mouth every 8 (eight) hours as needed for nausea or vomiting.   RABEprazole (ACIPHEX) 20 MG tablet Take 1 tablet (20 mg total) by mouth daily. 15 Mins. before evening meal   rosuvastatin (CRESTOR) 10 MG tablet Take 1 tablet (10 mg total) by mouth daily.   Semaglutide,0.25 or 0.5MG /DOS, 2 MG/1.5ML SOPN Inject 0.5 mg into the skin once a week.   sucralfate (CARAFATE) 1 g tablet Take 1 tablet (1 g total) by mouth 2 (two) times daily. 15 Minutes before evening meal and at bed time   tiotropium (SPIRIVA) 18 MCG inhalation capsule Place 1 capsule (18 mcg total) into inhaler and inhale daily.   No facility-administered medications prior to visit.    Review of Systems     Objective    BP (!) 144/77  Pulse 68   Temp 97.7 F (36.5 C) (Oral)   Resp 16   Wt 168 lb 3.2 oz (76.3 kg)   SpO2 98%   BMI 27.15 kg/m     Physical Exam   General: Appearance:     Well developed, well nourished female in no acute distress  Eyes:    PERRL, conjunctiva/corneas clear, EOM's intact       Lungs:     Clear to auscultation bilaterally, respirations unlabored  Heart:    Normal heart rate. Normal rhythm. No murmurs, rubs, or gallops.    MS:   All extremities are intact.     Neurologic:   Awake, alert, oriented x 3. No apparent focal neurological           defect.         Assessment & Plan     1. Essential hypertension Doing well with current medications. Home BP usually better than today's office measurement. Continue current medications.   - TSH  2. Coronary artery disease involving native coronary artery of native heart without angina pectoris Asymptomatic. Compliant with medication.  Continue aggressive risk factor modification.  Follow up with Dr. [name] tomorrow as scheduled.  - Lipid panel - Comprehensive metabolic panel - CBC - TSH  3. Chronic, continuous use of opioids No sign of abuse or diversion.   4. Spinal stenosis of lumbar region, unspecified whether neurogenic claudication present Pain is adequately controlled on current medications, continue unchanged.   5. Chronic obstructive pulmonary disease, unspecified COPD type ([place]) She is doing well on current maintenance inhalers and rarely needs rescue albuterol inhaler.   6. AAA (abdominal aortic aneurysm) without rupture ([place]) Monitored by Dr. Lucky Cowboy, last seen 01-2020  7. Secondary hyperparathyroidism of renal origin ([place]) Stable, continue managed per Dr. Juleen China. H 8. Estrogen deficiency  - DG Bone density [place]; Future  9. Encounter for immunization  - PFIZER Comirnaty(GRAY TOP)COVID-19 Vaccine #4  Follow up and AWV in December 2021     The entirety of the information documented in the History of Present Illness, Review of Systems and Physical Exam were personally obtained by me. Portions of this information were initially documented by the CMA and reviewed by me for thoroughness and accuracy.     Lelon Huh, MD  [facility] [phone] (phone) [phone] (fax)  [place]

## 2020-08-04 NOTE — Telephone Encounter (Signed)
I will be out of town the last week of the month. The only time I could work him before then is the 3:40 on Monday (40 minute visit) the 20th or 11:20 Tuesday the 21st.

## 2020-08-04 NOTE — Telephone Encounter (Signed)
Patient was present in office today and states that she discussed with Dr. Caryn Section accepting her son as a new patient. I did not see availability until 10/16/20 at 9Am that I may stick a new patient appointment. Patient states that her son has a history of hypertension and states that his blood pressure readings at home have been running high. Patient denies that he has been experiencing chest pain or dyspnea but states that he has not been in good health in years. Patient did not want to schedule appt for August and would like for Dr. Caryn Section to see if patient can be seen sooner by him.  Hannah Kirby DOB 05/23/1966 Insurance- current ( unsure of company)  Patient can be reached at [phone] with any further questions about scheduling new patient appointment.

## 2020-08-05 ENCOUNTER — Encounter: Payer: Self-pay | Admitting: Cardiovascular Disease

## 2020-08-05 ENCOUNTER — Other Ambulatory Visit: Payer: Self-pay

## 2020-08-05 ENCOUNTER — Ambulatory Visit (INDEPENDENT_AMBULATORY_CARE_PROVIDER_SITE_OTHER): Payer: Medicare Other | Admitting: Cardiovascular Disease

## 2020-08-05 VITALS — BP 144/96 | HR 68 | Ht 65.0 in | Wt 166.5 lb

## 2020-08-05 DIAGNOSIS — I10 Essential (primary) hypertension: Secondary | ICD-10-CM | POA: Diagnosis not present

## 2020-08-05 DIAGNOSIS — I2510 Atherosclerotic heart disease of native coronary artery without angina pectoris: Secondary | ICD-10-CM | POA: Diagnosis not present

## 2020-08-05 DIAGNOSIS — I714 Abdominal aortic aneurysm, without rupture, unspecified: Secondary | ICD-10-CM

## 2020-08-05 DIAGNOSIS — N1832 Chronic kidney disease, stage 3b: Secondary | ICD-10-CM | POA: Diagnosis not present

## 2020-08-05 DIAGNOSIS — I5032 Chronic diastolic (congestive) heart failure: Secondary | ICD-10-CM | POA: Diagnosis not present

## 2020-08-05 DIAGNOSIS — I25118 Atherosclerotic heart disease of native coronary artery with other forms of angina pectoris: Secondary | ICD-10-CM

## 2020-08-05 LAB — CBC
Hematocrit: 49.9 % — ABNORMAL HIGH (ref 34.0–46.6)
Hemoglobin: 16.9 g/dL — ABNORMAL HIGH (ref 11.1–15.9)
MCH: 32.7 pg (ref 26.6–33.0)
MCHC: 33.9 g/dL (ref 31.5–35.7)
MCV: 97 fL (ref 79–97)
Platelets: 225 10*3/uL (ref 150–450)
RBC: 5.17 x10E6/uL (ref 3.77–5.28)
RDW: 12.8 % (ref 11.7–15.4)
WBC: 9.6 10*3/uL (ref 3.4–10.8)

## 2020-08-05 LAB — COMPREHENSIVE METABOLIC PANEL
ALT: 19 IU/L (ref 0–32)
AST: 20 IU/L (ref 0–40)
Albumin/Globulin Ratio: 1.6 (ref 1.2–2.2)
Albumin: 4.4 g/dL (ref 3.7–4.7)
Alkaline Phosphatase: 125 IU/L — ABNORMAL HIGH (ref 44–121)
BUN/Creatinine Ratio: 17 (ref 12–28)
BUN: 22 mg/dL (ref 8–27)
Bilirubin Total: 0.3 mg/dL (ref 0.0–1.2)
CO2: 24 mmol/L (ref 20–29)
Calcium: 10.4 mg/dL — ABNORMAL HIGH (ref 8.7–10.3)
Chloride: 101 mmol/L (ref 96–106)
Creatinine, Ser: 1.28 mg/dL — ABNORMAL HIGH (ref 0.57–1.00)
Globulin, Total: 2.7 g/dL (ref 1.5–4.5)
Glucose: 101 mg/dL — ABNORMAL HIGH (ref 65–99)
Potassium: 3.9 mmol/L (ref 3.5–5.2)
Sodium: 140 mmol/L (ref 134–144)
Total Protein: 7.1 g/dL (ref 6.0–8.5)
eGFR: 44 mL/min/{1.73_m2} — ABNORMAL LOW (ref >59)

## 2020-08-05 LAB — LIPID PANEL
Chol/HDL Ratio: 3.6 ratio (ref 0.0–4.4)
Cholesterol, Total: 173 mg/dL (ref 100–199)
HDL: 48 mg/dL (ref >39)
LDL Chol Calc (NIH): 82 mg/dL (ref 0–99)
Triglycerides: 262 mg/dL — ABNORMAL HIGH (ref 0–149)
VLDL Cholesterol Cal: 43 mg/dL — ABNORMAL HIGH (ref 5–40)

## 2020-08-05 LAB — TSH: TSH: 2.34 u[IU]/mL (ref 0.450–4.500)

## 2020-08-05 MED ORDER — ROSUVASTATIN CALCIUM 20 MG PO TABS: 20.0000 mg | ORAL_TABLET | Freq: Every day | ORAL | 3 refills | Status: DC

## 2020-08-05 NOTE — Progress Notes (Signed)
Date:  08/05/2020   ID:  ATLAS KUC, DOB [birth year], MRN [id number]  Patient Location:  [address] [place] [address]   Provider location:   [facility], [place] office  PCP:  Birdie Sons, MD  Cardiologist:  Arvid Right [facility]   Chief Complaint  Patient presents with   3 month follow up     "Doing well." Medications reviewed by the patient verbally.      History of Present Illness:    Hannah Kirby is a 75 y.o. female past medical history of diabetes,  obesity,  Anxiety, panic attacks significantly improved with  weight loss on  Victoza,  coronary artery disease with stenting of her RCA  in January 2008,  long smoking history who continues to smoke,  hyperlipidemia  outbreak of her granulomatous disease in summary 2013,  prednisone for this condition.  History pinched nerve,  on pain medication. Chronic constipation Chronic shortness of breath, COPD Stroke 2018, June 2021, discharged on aspirin Plavix,  previous strokes on MRI who presents for routine followup of her coronary artery disease and hypertension.  In follow-up today reports she is feeling well Chronic bronchitis, thick cough, reports this is her baseline No chest pain concerning for angina Prior cardiac catheterization earlier 2020 reviewed, stent to the RCA On aspirin Plavix  Lab work reviewed Cholesterol above goal A1C 6.7, down from 8  BP at home: 120-130 Elevated on today's visit, above her baseline  Chronic bronchitis Continues to smoke 1 pack/day  EKG personally reviewed by myself on todays visit Normal sinus rhythm rate 68 bpm nonspecific ST abnormality  Recent studies reviewed Cath 03/2020 Severe single-vessel coronary artery disease with 99% distal RCA stenosis. Mild, nonobstructive left coronary artery disease with minimal luminal irregularities involving the LAD and 20-30% ostial ramus stenosis. Patent proximal/mid RCA stents with mild  to moderate in-stent restenosis (~30%). Mildly elevated left ventricular filling pressure (LVEDP 15-20 mmHg). Successful PCI to distal RCA using Resolute Onyx 3.0 x 15 mm drug-eluting stent with 0% residual stenosis and TIMI-3 flow. Right radial artery loop; consider alternative access for future catheterizations.   Recommendations: Remove right femoral artery sheath 2 hours after completion of procedure. Dual antiplatelet therapy with aspirin and clopidogrel for at least 12 months. Aggressive secondary prevention. Obtain echocardiogram. Gentle post catheterization hydration in the setting of chronic kidney disease and mildly elevated left ventricular filling pressure.  Past medical history reviewed  hospital July 2021  with bloody diarrhea, leukocytosis C.diff: Positive Treated with vancomycin  In 2020, torn retina, symptoms started with floaters and vision deficits  had laser treatment, and follow-up with [facility]  CT head: No CT evidence of acute intracranial abnormality. Moderate chronic small vessel ischemic disease with redemonstrated chronic lacunar infarcts in the bilateral basal ganglia, right thalamus and within the pons. Stable, mild generalized parenchymal atrophy.  MRI 1. 8 mm acute lacunar infarct within the right thalamocapsular junction. 2. An additional small acute white matter infarct is questioned within the left corona radiata, as described.  Stress test 11/2017 The study is normal. This is a low risk study. The left ventricular ejection fraction is normal (55-65%).  Previously had side effects on spironolactone, palpitations  On aspirin Plavix for prior stroke 2018   Chronically elevated WBC  hospital with stroke 10/30/2016 Left upper extremity weakness on coming back from the beach while in the car She was on aspirin and Plavix at the time Echocardiogram normal ejection  fractionno significant valve disease, moderate LVH, diastolic dysfunction   MRI  brain: Small acute right basal ganglia region infarct. Prior strokes MRA: negative. Carotid duplex showed bilateral 0-49% stenosis.    Past Medical History:  Diagnosis Date   Anemia    Anxiety    Arthritis    C. difficile colitis 08/09/2019   CAD (coronary artery disease)    a. 02/2006 PCI: BMS x 2 to RCA, cath o/w without significant coronary disease; b. nuclear stress test 07/2014: No ischemia/infarct; c. 11/2017 MV: no isch/infarct, EF 55-65%; d. 03/2020 NSTEMI/PCI: LM nl, LAD min irregs, RI 25, small, LCX nl, RCA 30p/m ISR, 99d (3.0x15 Resolute Onyx DES).   Chronic bronchitis (HCC)    secondary to cigarette smoking   Chronic kidney disease (CKD), stage III (moderate) (HCC)    COPD (chronic obstructive pulmonary disease) (HCC)    Diabetes mellitus    Diastolic dysfunction    a. 07/2019 Echo: EF 60-65%, no rwma, mod LVH. Nl RV size/fxn; b. 03/2020 Echo: EF 60-65%, mod LVH, gr1 DD, nl RV size/fxn, mild AS.   FHx: allergies    GERD (gastroesophageal reflux disease)    Goiter    Granulomatous disease ([place])    Hernia    Hyperlipidemia    Hypertension    Kidney stone on left side 2013   Microalbuminuria    NSTEMI (non-ST elevated myocardial infarction) ([place]) 03/23/2020   Obesity    Panic attacks    PVC's (premature ventricular contractions)    a. 03/2018 Zio: Occas PVCs (2.5%). Triggered events assoc w/ PVC/PAC.   Retinal tear 2020   Smokers' cough ([place])    Spinal stenosis    Stroke ([place]) 10/29/2016   mild left side weakness   Stroke ([place]) 08/01/2019   Tobacco abuse    Past Surgical History:  Procedure Laterality Date   ABDOMINAL HYSTERECTOMY     BREAST SURGERY     CATARACT EXTRACTION W/PHACO Right 03/01/2017   Procedure: CATARACT EXTRACTION PHACO AND INTRAOCULAR LENS PLACEMENT ([place]) RIGHT DIABETIC;  Surgeon: Leandrew Koyanagi, MD;  Location: [place];  Service: Ophthalmology;  Laterality: Right;   CATARACT EXTRACTION W/PHACO Left 03/22/2017   Procedure: CATARACT  EXTRACTION PHACO AND INTRAOCULAR LENS PLACEMENT ([place]) LEFT DIABETIC;  Surgeon: Leandrew Koyanagi, MD;  Location: [place];  Service: Ophthalmology;  Laterality: Left;  Diabetic - insulin and oral meds   COLONOSCOPY WITH PROPOFOL N/A 09/23/2014   Procedure: COLONOSCOPY WITH PROPOFOL;  Surgeon: Lollie Sails, MD;  Location: [facility] ENDOSCOPY;  Service: Endoscopy;  Laterality: N/A;   COLONOSCOPY WITH PROPOFOL N/A 01/11/2018   Procedure: COLONOSCOPY WITH PROPOFOL;  Surgeon: Lollie Sails, MD;  Location: [facility] ENDOSCOPY;  Service: Endoscopy;  Laterality: N/A;   COLONOSCOPY WITH PROPOFOL N/A 04/24/2018   Procedure: COLONOSCOPY WITH PROPOFOL;  Surgeon: Lollie Sails, MD;  Location[facility] ENDOSCOPY;  Service: Endoscopy;  Laterality: N/A;   COLONOSCOPY WITH PROPOFOL N/A 11/19/2019   Procedure: COLONOSCOPY WITH PROPOFOL;  Surgeon: Lucilla Lame, MD;  Location: [facility] ENDOSCOPY;  Service: Endoscopy;  Laterality: N/A;   CORONARY ANGIOPLASTY WITH STENT PLACEMENT  2008   CORONARY STENT INTERVENTION N/A 03/24/2020   Procedure: CORONARY STENT INTERVENTION;  Surgeon: Nelva Bush, MD;  Location: [place] CV LAB;  Service: Cardiovascular;  Laterality: N/A;   ESOPHAGOGASTRODUODENOSCOPY (EGD) WITH PROPOFOL N/A 12/30/2014   Procedure: ESOPHAGOGASTRODUODENOSCOPY (EGD) WITH PROPOFOL;  Surgeon: Lollie Sails, MD;  Location[facility] END[facility]COPY;  Service: Endoscopy;  Laterality: N/A;   ESOPHAGOGASTRODUODENOSCOPY (EGD) WITH PROPOFOL N/A 07/19/2016  Procedure: ESOPHAGOGASTRODUODENOSCOPY (EGD) WITH PROPOFOL;  Surgeon: Lollie Sails, MD;  Location: [facility] ENDOSCOPY;  Service: Endoscopy;  Laterality: N/A;   ESOPHAGOGASTRODUODENOSCOPY (EGD) WITH PROPOFOL N/A 04/24/2018   Procedure: ESOPHAGOGASTRODUODENOSCOPY (EGD) WITH PROPOFOL;  Surgeon: Lollie Sails, MD;  Location: [facility] ENDOSCOPY;  Service: Endoscopy;  Laterality: N/A;   hysterectomy (other)     LEFT HEART CATH AND CORONARY ANGIOGRAPHY N/A 03/24/2020    Procedure: LEFT HEART CATH AND CORONARY ANGIOGRAPHY;  Surgeon: Nelva Bush, MD;  Location: [place] CV LAB;  Service: Cardiovascular;  Laterality: N/A;    Current Outpatient Medications on File Prior to Visit  Medication Sig Dispense Refill   acetaminophen (TYLENOL) 500 MG tablet Take 500 mg by mouth every 6 (six) hours as needed.     albuterol (VENTOLIN HFA) 108 (90 Base) MCG/ACT inhaler Inhale 2 puffs into the lungs every 4 (four) hours as needed for wheezing or shortness of breath. 48 g 3   amLODipine (NORVASC) 5 MG tablet Take 1 tablet (5 mg total) by mouth daily. 90 tablet 3   aspirin (ASPIR-81) 81 MG EC tablet Take 81 mg by mouth daily.     clopidogrel (PLAVIX) 75 MG tablet Take 1 tablet (75 mg total) by mouth daily. 90 tablet 3   cyanocobalamin 1000 MCG tablet Take 1,000 mcg by mouth daily.     ezetimibe (ZETIA) 10 MG tablet Take 1 tablet (10 mg total) by mouth daily. 90 tablet 3   fluticasone furoate-vilanterol (BREO ELLIPTA) 100-25 MCG/INH AEPB Inhale 1 puff into the lungs daily. 84 each 3   HYDROcodone-acetaminophen (NORCO/VICODIN) 5-325 MG tablet Take 1 tablet by mouth every 6 (six) hours as needed. 120 tablet 0   insulin glargine (LANTUS) 100 UNIT/ML injection Inject 22 Units into the skin daily.      lisinopril (ZESTRIL) 20 MG tablet Take 1 tablet (20 mg total) by mouth daily. 90 tablet 3   metoprolol tartrate (LOPRESSOR) 100 MG tablet Take 1 tablet (100 mg total) by mouth 2 (two) times daily. 180 tablet 3   Multiple Vitamins-Minerals (DAILY MULTI) TABS Take 1 tablet by mouth daily.     nitroGLYCERIN (NITROSTAT) 0.4 MG SL tablet Place 1 tablet (0.4 mg total) under the tongue every 5 (five) minutes as needed. 25 tablet 6   Omega-3 Fatty Acids (FISH OIL) 1000 MG CAPS Take 2 capsules by mouth in the morning and at bedtime.      ondansetron (ZOFRAN ODT) 4 MG disintegrating tablet Take 1 tablet (4 mg total) by mouth every 8 (eight) hours as needed for nausea or vomiting. 20  tablet 0   RABEprazole (ACIPHEX) 20 MG tablet Take 1 tablet (20 mg total) by mouth daily. 15 Mins. before evening meal 90 tablet 3   rosuvastatin (CRESTOR) 10 MG tablet Take 1 tablet (10 mg total) by mouth daily. 90 tablet 3   Semaglutide,0.25 or 0.5MG /DOS, 2 MG/1.5ML SOPN Inject 0.5 mg into the skin once a week.     sucralfate (CARAFATE) 1 g tablet Take 1 tablet (1 g total) by mouth 2 (two) times daily. 15 Minutes before evening meal and at bed time 180 tablet 3   tiotropium (SPIRIVA) 18 MCG inhalation capsule Place 1 capsule (18 mcg total) into inhaler and inhale daily. 90 capsule 4   No current facility-administered medications on file prior to visit.    Allergies:   Spironolactone, Sulfonamide derivatives, Other, Sulfa antibiotics, Codeine, and Prednisone   Social History   Tobacco Use   Smoking status: Every Day  Packs/day: 1.00    Years: 40.00    Pack years: 40.00    Types: Cigarettes   Smokeless tobacco: Never  Vaping Use   Vaping Use: Former  Substance Use Topics   Alcohol use: Yes    Comment: 1 glass of wine every few months on special occassions   Drug use: No      Family Hx: The patient's family history includes Breast cancer in her maternal aunt; Congestive Heart Failure in her mother; Diabetes in her mother; Heart failure in her mother; Lung cancer in her father; Stroke in her mother.  ROS:   Please see the history of present illness.    Review of Systems  Constitutional: Negative.   HENT: Negative.    Respiratory:  Positive for cough and shortness of breath.   Cardiovascular: Negative.   Gastrointestinal: Negative.   Musculoskeletal: Negative.   Skin: Negative.   Neurological: Negative.   Psychiatric/Behavioral: Negative.    All other systems reviewed and are negative.   Labs/Other Tests and Data Reviewed:    Recent Labs: 08/09/2019: Magnesium 1.6 11/20/2019: BNP 217.5 08/04/2020: ALT 19; BUN 22; Creatinine, Ser 1.28; Hemoglobin 16.9; Platelets 225;  Potassium 3.9; Sodium 140; TSH 2.340   Recent Lipid Panel Lab Results  Component Value Date/Time   CHOL 173 08/04/2020 11:27 AM   TRIG 262 (H) 08/04/2020 11:27 AM   HDL 48 08/04/2020 11:27 AM   CHOLHDL 3.6 08/04/2020 11:27 AM   CHOLHDL 3.5 03/24/2020 03:33 AM   LDLCALC 82 08/04/2020 11:27 AM    Wt Readings from Last 3 Encounters:  08/05/20 166 lb 8 oz (75.5 kg)  08/04/20 168 lb 3.2 oz (76.3 kg)  05/01/20 165 lb (74.8 kg)     Exam:    Vital Signs: Vital signs may also be detailed in the HPI BP (!) 144/96 (BP Location: Right Arm, Patient Position: Sitting, Cuff Size: Normal)   Pulse 68   Ht 5\' 5"  (1.651 m)   Wt 166 lb 8 oz (75.5 kg)   SpO2 94%   BMI 27.71 kg/m    Constitutional:  oriented to person, place, and time. No distress.  HENT:  Head: Grossly normal Eyes:  no discharge. No scleral icterus.  Neck: No JVD, no carotid bruits  Cardiovascular: Regular rate and rhythm, no murmurs appreciated Pulmonary/Chest: Decreased breath sounds, scattered Rales Abdominal: Soft.  no distension.  no tenderness.  Musculoskeletal: Normal range of motion Neurological:  normal muscle tone. Coordination normal. No atrophy Skin: Skin warm and dry Psychiatric: normal affect, pleasant   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 (HCC) Recent stent to RCA, on aspirin Plavix, Crestor Zetia  Dyspnea, unspecified type Deconditioned, long smoking history, COPD Has inhalers Chronic bronchitis  Essential hypertension Blood pressure controlled at home, no changes made Higher in the office today  Renal insufficiency Followed by nephrology, creatinine 1.3-1.6  Hyperlipidemia LDL goal <70 Recommend we increase Crestor from 10 up to 20, continue Zetia Goal LDL 60 or less  Chronic obstructive pulmonary disease, unspecified COPD type ([place]) Smoking cessation recommended Presents with no oxygen today Significant loose cough  consistent with chronic bronchitis  Vision loss [facility]  CVA: Several strokes in the past , chronic small vessel disease, continues to smoke Crestor, Zetia, goal LDL less than 70   Addendum Troponin of 1000, discussed with her, she will be referred to the emergency room Will likely need cardiac catheterization   Total  encounter time more than 35 minutes  Greater than 50% was spent in counseling and coordination of care with the patient    Signed, Ida Rogue, MD  08/05/2020 11:36 AM    [place] Office [address] [place] [address], [place],  [address]

## 2020-08-05 NOTE — Patient Instructions (Addendum)
Medication Instructions:  Please increase the crestor up to 20 mg daily  If you need a refill on your cardiac medications before your next appointment, please call your pharmacy.   Lab work: No new labs needed  Testing/Procedures: No new testing needed  Follow-Up:  You will need a follow up appointment in 6 months  Providers on your designated Care Team:   Murray Hodgkins, NP Christell Faith, PA-C Marrianne Mood, PA-C [place], [place]  COVID-19 Vaccine Information can be found at: ShippingScam.co.uk For questions related to vaccine distribution or appointments, please email vaccine@[email or web address] .com or call [phone].

## 2020-08-16 ENCOUNTER — Encounter: Payer: Self-pay | Admitting: Family Medicine

## 2020-08-17 ENCOUNTER — Other Ambulatory Visit: Payer: Self-pay

## 2020-08-17 MED ORDER — ROSUVASTATIN CALCIUM 20 MG PO TABS: 20.0000 mg | ORAL_TABLET | Freq: Every day | ORAL | 3 refills | Status: DC

## 2020-08-18 ENCOUNTER — Telehealth: Payer: Medicare Other

## 2020-08-18 ENCOUNTER — Telehealth: Payer: Self-pay

## 2020-08-18 NOTE — Telephone Encounter (Signed)
  Care Management   Follow Up Note   08/18/2020 Name: Hannah Kirby MRN: [id number] DOB: 1946/01/03   Primary Care Provider: Birdie Sons, MD Reason for referral : Chronic Care Management   An unsuccessful telephone outreach was conducted today. Mrs. Pollina is currently enrolled in the Chronic  Care Management program.     Follow Up Plan:  A HIPAA compliant voice message was left today requesting a return call.    C[facility]y [facility] Care Management [facility] [phone]

## 2020-08-20 DIAGNOSIS — N2581 Secondary hyperparathyroidism of renal origin: Secondary | ICD-10-CM | POA: Diagnosis not present

## 2020-08-20 DIAGNOSIS — R809 Proteinuria, unspecified: Secondary | ICD-10-CM | POA: Diagnosis not present

## 2020-08-20 DIAGNOSIS — I129 Hypertensive chronic kidney disease with stage 1 through stage 4 chronic kidney disease, or unspecified chronic kidney disease: Secondary | ICD-10-CM | POA: Diagnosis not present

## 2020-08-20 DIAGNOSIS — E1122 Type 2 diabetes mellitus with diabetic chronic kidney disease: Secondary | ICD-10-CM | POA: Diagnosis not present

## 2020-08-20 DIAGNOSIS — N1832 Chronic kidney disease, stage 3b: Secondary | ICD-10-CM | POA: Diagnosis not present

## 2020-08-26 ENCOUNTER — Other Ambulatory Visit: Payer: Self-pay

## 2020-08-26 ENCOUNTER — Ambulatory Visit
Admission: RE | Admit: 2020-08-26 | Discharge: 2020-08-26 | Disposition: A | Discharge status: Home / Self Care | Payer: Medicare Other | Source: Ambulatory Visit | Attending: Family Medicine | Admitting: Family Medicine

## 2020-08-26 DIAGNOSIS — E2839 Other primary ovarian failure: Secondary | ICD-10-CM | POA: Diagnosis not present

## 2020-08-26 DIAGNOSIS — M85852 Other specified disorders of bone density and structure, left thigh: Secondary | ICD-10-CM | POA: Diagnosis not present

## 2020-08-27 ENCOUNTER — Other Ambulatory Visit: Payer: Self-pay | Admitting: *Deleted

## 2020-08-27 ENCOUNTER — Encounter: Payer: Self-pay | Admitting: Family Medicine

## 2020-08-27 DIAGNOSIS — M48061 Spinal stenosis, lumbar region without neurogenic claudication: Secondary | ICD-10-CM

## 2020-08-27 DIAGNOSIS — F1190 Opioid use, unspecified, uncomplicated: Secondary | ICD-10-CM

## 2020-08-27 MED ORDER — HYDROCODONE-ACETAMINOPHEN 5-325 MG PO TABS: 1.0000 | ORAL_TABLET | Freq: Four times a day (QID) | ORAL | 0 refills | Status: DC | PRN

## 2020-08-27 MED ORDER — AMLODIPINE BESYLATE 5 MG PO TABS: 5.0000 mg | ORAL_TABLET | Freq: Every day | ORAL | 2 refills | Status: DC

## 2020-08-27 NOTE — Telephone Encounter (Signed)
Requested Prescriptions   Signed Prescriptions Disp Refills   amLODipine (NORVASC) 5 MG tablet 90 tablet 2    Sig: Take 1 tablet (5 mg total) by mouth daily.    Authorizing Provider: Minna Merritts    Ordering User: Britt Bottom

## 2020-09-14 DIAGNOSIS — E785 Hyperlipidemia, unspecified: Secondary | ICD-10-CM | POA: Diagnosis not present

## 2020-09-14 DIAGNOSIS — E1159 Type 2 diabetes mellitus with other circulatory complications: Secondary | ICD-10-CM | POA: Diagnosis not present

## 2020-09-14 DIAGNOSIS — E1169 Type 2 diabetes mellitus with other specified complication: Secondary | ICD-10-CM | POA: Diagnosis not present

## 2020-09-14 DIAGNOSIS — I152 Hypertension secondary to endocrine disorders: Secondary | ICD-10-CM | POA: Diagnosis not present

## 2020-09-14 DIAGNOSIS — F17200 Nicotine dependence, unspecified, uncomplicated: Secondary | ICD-10-CM | POA: Diagnosis not present

## 2020-09-14 DIAGNOSIS — E1121 Type 2 diabetes mellitus with diabetic nephropathy: Secondary | ICD-10-CM | POA: Diagnosis not present

## 2020-09-14 DIAGNOSIS — Z794 Long term (current) use of insulin: Secondary | ICD-10-CM | POA: Diagnosis not present

## 2020-09-28 ENCOUNTER — Encounter: Payer: Self-pay | Admitting: Family Medicine

## 2020-09-28 DIAGNOSIS — M48061 Spinal stenosis, lumbar region without neurogenic claudication: Secondary | ICD-10-CM

## 2020-09-28 DIAGNOSIS — F1190 Opioid use, unspecified, uncomplicated: Secondary | ICD-10-CM

## 2020-09-29 DIAGNOSIS — M75101 Unspecified rotator cuff tear or rupture of right shoulder, not specified as traumatic: Secondary | ICD-10-CM | POA: Diagnosis not present

## 2020-09-29 MED ORDER — HYDROCODONE-ACETAMINOPHEN 5-325 MG PO TABS: 1.0000 | ORAL_TABLET | Freq: Four times a day (QID) | ORAL | 0 refills | Status: DC | PRN

## 2020-09-30 DIAGNOSIS — H409 Unspecified glaucoma: Secondary | ICD-10-CM | POA: Diagnosis not present

## 2020-09-30 DIAGNOSIS — D649 Anemia, unspecified: Secondary | ICD-10-CM | POA: Diagnosis not present

## 2020-09-30 DIAGNOSIS — Z8673 Personal history of transient ischemic attack (TIA), and cerebral infarction without residual deficits: Secondary | ICD-10-CM | POA: Diagnosis not present

## 2020-09-30 DIAGNOSIS — Z9181 History of falling: Secondary | ICD-10-CM | POA: Diagnosis not present

## 2020-09-30 DIAGNOSIS — Z7902 Long term (current) use of antithrombotics/antiplatelets: Secondary | ICD-10-CM | POA: Diagnosis not present

## 2020-09-30 DIAGNOSIS — Z7982 Long term (current) use of aspirin: Secondary | ICD-10-CM | POA: Diagnosis not present

## 2020-09-30 DIAGNOSIS — Z79891 Long term (current) use of opiate analgesic: Secondary | ICD-10-CM | POA: Diagnosis not present

## 2020-09-30 DIAGNOSIS — I5189 Other ill-defined heart diseases: Secondary | ICD-10-CM | POA: Diagnosis not present

## 2020-09-30 DIAGNOSIS — F17210 Nicotine dependence, cigarettes, uncomplicated: Secondary | ICD-10-CM | POA: Diagnosis not present

## 2020-09-30 DIAGNOSIS — M75101 Unspecified rotator cuff tear or rupture of right shoulder, not specified as traumatic: Secondary | ICD-10-CM | POA: Diagnosis not present

## 2020-09-30 DIAGNOSIS — Z794 Long term (current) use of insulin: Secondary | ICD-10-CM | POA: Diagnosis not present

## 2020-09-30 DIAGNOSIS — E785 Hyperlipidemia, unspecified: Secondary | ICD-10-CM | POA: Diagnosis not present

## 2020-09-30 DIAGNOSIS — E119 Type 2 diabetes mellitus without complications: Secondary | ICD-10-CM | POA: Diagnosis not present

## 2020-09-30 DIAGNOSIS — N289 Disorder of kidney and ureter, unspecified: Secondary | ICD-10-CM | POA: Diagnosis not present

## 2020-09-30 DIAGNOSIS — M1990 Unspecified osteoarthritis, unspecified site: Secondary | ICD-10-CM | POA: Diagnosis not present

## 2020-09-30 DIAGNOSIS — K219 Gastro-esophageal reflux disease without esophagitis: Secondary | ICD-10-CM | POA: Diagnosis not present

## 2020-10-06 DIAGNOSIS — M75101 Unspecified rotator cuff tear or rupture of right shoulder, not specified as traumatic: Secondary | ICD-10-CM | POA: Diagnosis not present

## 2020-10-06 DIAGNOSIS — E785 Hyperlipidemia, unspecified: Secondary | ICD-10-CM | POA: Diagnosis not present

## 2020-10-06 DIAGNOSIS — K219 Gastro-esophageal reflux disease without esophagitis: Secondary | ICD-10-CM | POA: Diagnosis not present

## 2020-10-06 DIAGNOSIS — E119 Type 2 diabetes mellitus without complications: Secondary | ICD-10-CM | POA: Diagnosis not present

## 2020-10-06 DIAGNOSIS — I5189 Other ill-defined heart diseases: Secondary | ICD-10-CM | POA: Diagnosis not present

## 2020-10-06 DIAGNOSIS — D649 Anemia, unspecified: Secondary | ICD-10-CM | POA: Diagnosis not present

## 2020-10-09 ENCOUNTER — Telehealth: Payer: Self-pay

## 2020-10-09 DIAGNOSIS — I5189 Other ill-defined heart diseases: Secondary | ICD-10-CM | POA: Diagnosis not present

## 2020-10-09 DIAGNOSIS — E119 Type 2 diabetes mellitus without complications: Secondary | ICD-10-CM | POA: Diagnosis not present

## 2020-10-09 DIAGNOSIS — K219 Gastro-esophageal reflux disease without esophagitis: Secondary | ICD-10-CM | POA: Diagnosis not present

## 2020-10-09 DIAGNOSIS — D649 Anemia, unspecified: Secondary | ICD-10-CM | POA: Diagnosis not present

## 2020-10-09 DIAGNOSIS — M75101 Unspecified rotator cuff tear or rupture of right shoulder, not specified as traumatic: Secondary | ICD-10-CM | POA: Diagnosis not present

## 2020-10-09 DIAGNOSIS — E785 Hyperlipidemia, unspecified: Secondary | ICD-10-CM | POA: Diagnosis not present

## 2020-10-09 NOTE — Telephone Encounter (Signed)
Copied from [place] [id number]. Topic: Appointment Scheduling - Scheduling Inquiry for Clinic >> Oct 09, 2020  8:52 AM Lennox Solders wrote: Reason for CRM: sore throat x 3 days getting worst. Pt would like to be seen today

## 2020-10-09 NOTE — Telephone Encounter (Signed)
There are no available appointments in our office today. I called patient and advised her of this and recommended that she go to an urgent care. Patient agrees to go to urgent care for evaluation.

## 2020-10-12 DIAGNOSIS — I5189 Other ill-defined heart diseases: Secondary | ICD-10-CM | POA: Diagnosis not present

## 2020-10-12 DIAGNOSIS — K219 Gastro-esophageal reflux disease without esophagitis: Secondary | ICD-10-CM | POA: Diagnosis not present

## 2020-10-12 DIAGNOSIS — M75101 Unspecified rotator cuff tear or rupture of right shoulder, not specified as traumatic: Secondary | ICD-10-CM | POA: Diagnosis not present

## 2020-10-12 DIAGNOSIS — E785 Hyperlipidemia, unspecified: Secondary | ICD-10-CM | POA: Diagnosis not present

## 2020-10-12 DIAGNOSIS — D649 Anemia, unspecified: Secondary | ICD-10-CM | POA: Diagnosis not present

## 2020-10-12 DIAGNOSIS — E119 Type 2 diabetes mellitus without complications: Secondary | ICD-10-CM | POA: Diagnosis not present

## 2020-10-16 DIAGNOSIS — I5189 Other ill-defined heart diseases: Secondary | ICD-10-CM | POA: Diagnosis not present

## 2020-10-16 DIAGNOSIS — E785 Hyperlipidemia, unspecified: Secondary | ICD-10-CM | POA: Diagnosis not present

## 2020-10-16 DIAGNOSIS — E119 Type 2 diabetes mellitus without complications: Secondary | ICD-10-CM | POA: Diagnosis not present

## 2020-10-16 DIAGNOSIS — D649 Anemia, unspecified: Secondary | ICD-10-CM | POA: Diagnosis not present

## 2020-10-16 DIAGNOSIS — M75101 Unspecified rotator cuff tear or rupture of right shoulder, not specified as traumatic: Secondary | ICD-10-CM | POA: Diagnosis not present

## 2020-10-16 DIAGNOSIS — K219 Gastro-esophageal reflux disease without esophagitis: Secondary | ICD-10-CM | POA: Diagnosis not present

## 2020-10-19 DIAGNOSIS — M75101 Unspecified rotator cuff tear or rupture of right shoulder, not specified as traumatic: Secondary | ICD-10-CM | POA: Diagnosis not present

## 2020-10-19 DIAGNOSIS — I5189 Other ill-defined heart diseases: Secondary | ICD-10-CM | POA: Diagnosis not present

## 2020-10-19 DIAGNOSIS — E785 Hyperlipidemia, unspecified: Secondary | ICD-10-CM | POA: Diagnosis not present

## 2020-10-19 DIAGNOSIS — K219 Gastro-esophageal reflux disease without esophagitis: Secondary | ICD-10-CM | POA: Diagnosis not present

## 2020-10-19 DIAGNOSIS — D649 Anemia, unspecified: Secondary | ICD-10-CM | POA: Diagnosis not present

## 2020-10-19 DIAGNOSIS — E119 Type 2 diabetes mellitus without complications: Secondary | ICD-10-CM | POA: Diagnosis not present

## 2020-10-22 DIAGNOSIS — M75101 Unspecified rotator cuff tear or rupture of right shoulder, not specified as traumatic: Secondary | ICD-10-CM | POA: Diagnosis not present

## 2020-10-22 DIAGNOSIS — K219 Gastro-esophageal reflux disease without esophagitis: Secondary | ICD-10-CM | POA: Diagnosis not present

## 2020-10-22 DIAGNOSIS — I5189 Other ill-defined heart diseases: Secondary | ICD-10-CM | POA: Diagnosis not present

## 2020-10-22 DIAGNOSIS — D649 Anemia, unspecified: Secondary | ICD-10-CM | POA: Diagnosis not present

## 2020-10-22 DIAGNOSIS — E119 Type 2 diabetes mellitus without complications: Secondary | ICD-10-CM | POA: Diagnosis not present

## 2020-10-22 DIAGNOSIS — E785 Hyperlipidemia, unspecified: Secondary | ICD-10-CM | POA: Diagnosis not present

## 2020-10-27 ENCOUNTER — Telehealth (INDEPENDENT_AMBULATORY_CARE_PROVIDER_SITE_OTHER): Payer: Medicare Other | Admitting: Family Medicine

## 2020-10-27 ENCOUNTER — Encounter: Payer: Self-pay | Admitting: Family Medicine

## 2020-10-27 DIAGNOSIS — J069 Acute upper respiratory infection, unspecified: Secondary | ICD-10-CM

## 2020-10-27 DIAGNOSIS — J029 Acute pharyngitis, unspecified: Secondary | ICD-10-CM | POA: Diagnosis not present

## 2020-10-27 DIAGNOSIS — I2510 Atherosclerotic heart disease of native coronary artery without angina pectoris: Secondary | ICD-10-CM

## 2020-10-27 MED ORDER — AMOXICILLIN 875 MG PO TABS: 875.0000 mg | ORAL_TABLET | Freq: Two times a day (BID) | ORAL | 0 refills | Status: AC

## 2020-10-27 NOTE — Progress Notes (Signed)
MyChart Video Visit    Virtual Visit via Video Note   This visit type was conducted due to national recommendations for restrictions regarding the COVID-19 Pandemic (e.g. social distancing) in an effort to limit this patient's exposure and mitigate transmission in our community. This patient is at least at moderate risk for complications without adequate follow up. This format is felt to be most appropriate for this patient at this time. Physical exam was limited by quality of the video and audio technology used for the visit.   Patient location: home Provider location: office  I discussed the limitations of evaluation and management by telemedicine and the availability of in person appointments. The patient expressed understanding and agreed to proceed.  Patient: Hannah Kirby   DOB: [birth year]   75 y.o. Female  MRN: [id number] Visit Date: 10/27/2020  Today's healthcare provider: Vernie Murders, PA-C   No chief complaint on file.  Subjective    HPI  Patient is a 75 year old female who presents via video visit for symptoms of sore throat.  She states she has had the sore throat for a few days.  She reports that she has had thrush in the past and feels like this is the same.  She does have some trouble swallowing and states that is when the pain is the worst.  Past Medical History:  Diagnosis Date   Anemia    Anxiety    Arthritis    C. difficile colitis 08/09/2019   CAD (coronary artery disease)    a. 02/2006 PCI: BMS x 2 to RCA, cath o/w without significant coronary disease; b. nuclear stress test 07/2014: No ischemia/infarct; c. 11/2017 MV: no isch/infarct, EF 55-65%; d. 03/2020 NSTEMI/PCI: LM nl, LAD min irregs, RI 25, small, LCX nl, RCA 30p/m ISR, 99d (3.0x15 Resolute Onyx DES).   Chronic bronchitis (HCC)    secondary to cigarette smoking   Chronic kidney disease (CKD), stage III (moderate) (HCC)    COPD (chronic obstructive pulmonary disease) (HCC)    Diabetes mellitus     Diastolic dysfunction    a. 07/2019 Echo: EF 60-65%, no rwma, mod LVH. Nl RV size/fxn; b. 03/2020 Echo: EF 60-65%, mod LVH, gr1 DD, nl RV size/fxn, mild AS.   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 03/2018 [facility]: Occas PVCs (2.5%). Triggered events assoc w/ PVC/PAC.   Retinal tear 2020   Smokers' cough ([place])    Spinal stenosis    Stroke ([place]) 10/29/2016   mild left side weakness   Stroke ([place]) 08/01/2019   Tobacco abuse    Past Surgical History:  Procedure Laterality Date   ABDOMINAL HYSTERECTOMY     BREAST SURGERY     CATARACT EXTRACTION W/PHACO Right 03/01/2017   Procedure: CATARACT EXTRACTION PHACO AND INTRAOCULAR LENS PLACEMENT ([place]) RIGHT DIABETIC;  Surgeon: Leandrew Koyanagi, MD;  Location: [place];  Service: Ophthalmology;  Laterality: Right;   CATARACT EXTRACTION W/PHACO Left 03/22/2017   Procedure: CATARACT EXTRACTION PHACO AND INTRAOCULAR LENS PLACEMENT ([place]) LEFT DIABETIC;  Surgeon: Leandrew Koyanagi, MD;  Location: [place];  Service: Ophthalmology;  Laterality: Left;  Diabetic - insulin and oral meds   COLONOSCOPY WITH PROPOFOL N/A 09/23/2014   Procedure: COLONOSCOPY WITH PROPOFOL;  Surgeon: Lollie Sails, MD;  Location: [facility] ENDOSCOPY;  Service: Endoscopy;  Laterality: N/A;   COLONOSCOPY WITH PROPOFOL N/A 01/11/2018   Procedure: COLONOSCOPY WITH PROPOFOL;  Surgeon: Lollie Sails, MD;  Location: [facility] ENDOSCOPY;  Service: Endoscopy;  Laterality: N/A;   COLONOSCOPY WITH PROPOFOL N/A 04/24/2018   Procedure: COLONOSCOPY WITH PROPOFOL;  Surgeon: Lollie Sails, MD;  Location: [facility] ENDOSCOPY;  Service: Endoscopy;  Laterality: N/A;   COLONOSCOPY  WITH PROPOFOL N/A 11/19/2019   Procedure: COLONOSCOPY WITH PROPOFOL;  Surgeon: Lucilla Lame, MD;  Location: [facility] ENDOSCOPY;  Service: Endoscopy;  Laterality: N/A;   CORONARY ANGIOPLASTY WITH STENT PLACEMENT  2008   CORONARY STENT INTERVENTION N/A 03/24/2020   Procedure: CORONARY STENT INTERVENTION;  Surgeon: Nelva Bush, MD;  Location: [place] CV LAB;  Service: Cardiovascular;  Laterality: N/A;   ESOPHAGOGASTRODUODENOSCOPY (EGD) WITH PROPOFOL N/A 12/30/2014   Procedure: ESOPHAGOGASTRODUODENOSCOPY (EGD) WITH PROPOFOL;  Surgeon: Lollie Sails, MD;  Location: [facility] ENDOSCOPY;  Service: Endoscopy;  Laterality: N/A;   ESOPHAGOGASTRODUODENOSCOPY (EGD) WITH PROPOFOL N/A 07/19/2016   Procedure: ESOPHAGOGASTRODUODENOSCOPY (EGD) WITH PROPOFOL;  Surgeon: Lollie Sails, MD;  Location: [facility] ENDOSCOPY;  Service: Endoscopy;  Laterality: N/A;   ESOPHAGOGASTRODUODENOSCOPY (EGD) WITH PROPOFOL N/A 04/24/2018   Procedure: ESOPHAGOGASTRODUODENOSCOPY (EGD) WITH PROPOFOL;  Surgeon: Lollie Sails, MD;  Location: [facility] ENDOSCOPY;  Service: Endoscopy;  Laterality: N/A;   hysterectomy (other)     LEFT HEART CATH AND CORONARY ANGIOGRAPHY N/A 03/24/2020   Procedure: LEFT HEART CATH AND CORONARY ANGIOGRAPHY;  Surgeon: Nelva Bush, MD;  Location: [place] CV LAB;  Service: Cardiovascular;  Laterality: N/A;   Family History  Problem Relation Age of Onset   Heart failure Mother    Stroke Mother    Diabetes Mother    Congestive Heart Failure Mother    Lung cancer Father    Breast cancer Maternal Aunt    Allergies  Allergen Reactions   Spironolactone Shortness Of Breath   Sulfonamide Derivatives Other (See Comments)    Last taken as a child; made her mouth break out   Other Other (See Comments)    Mouth blisters   Sulfa Antibiotics Other (See Comments)    Mouth blisters Welts on mouth    Codeine Nausea And Vomiting and Other (See Comments)    Nausea   Prednisone Palpitations and Other (See  Comments)    Made her legs "feel weird."    Medications: Outpatient Medications Prior to Visit  Medication Sig   acetaminophen (TYLENOL) 500 MG tablet Take 500 mg by mouth every 6 (six) hours as needed.   albuterol (VENTOLIN HFA) 108 (90 Base) MCG/ACT inhaler Inhale 2 puffs into the lungs every 4 (four) hours as needed for wheezing or shortness of breath.   amLODipine (NORVASC) 5 MG tablet Take 1 tablet (5 mg total) by mouth daily.   aspirin (ASPIR-81) 81 MG EC tablet Take 81 mg by mouth daily.   clopidogrel (PLAVIX) 75 MG tablet Take 1 tablet (75 mg total) by mouth daily.   cyanocobalamin 1000 MCG tablet Take 1,000 mcg by mouth daily.   ezetimibe (ZETIA) 10 MG tablet Take 1 tablet (10 mg total) by mouth daily.   fluticasone furoate-vilanterol (BREO ELLIPTA) 100-25 MCG/INH AEPB Inhale 1 puff into the lungs daily.   HYDROcodone-acetaminophen (NORCO/VICODIN) 5-325 MG tablet Take 1 tablet by mouth every 6 (six) hours as needed.  insulin glargine (LANTUS) 100 UNIT/ML injection Inject 22 Units into the skin daily.    lisinopril (ZESTRIL) 20 MG tablet Take 1 tablet (20 mg total) by mouth daily.   metoprolol tartrate (LOPRESSOR) 100 MG tablet Take 1 tablet (100 mg total) by mouth 2 (two) times daily.   Multiple Vitamins-Minerals (DAILY MULTI) TABS Take 1 tablet by mouth daily.   nitroGLYCERIN (NITROSTAT) 0.4 MG SL tablet Place 1 tablet (0.4 mg total) under the tongue every 5 (five) minutes as needed.   Omega-3 Fatty Acids (FISH OIL) 1000 MG CAPS Take 2 capsules by mouth in the morning and at bedtime.    ondansetron (ZOFRAN ODT) 4 MG disintegrating tablet Take 1 tablet (4 mg total) by mouth every 8 (eight) hours as needed for nausea or vomiting.   RABEprazole (ACIPHEX) 20 MG tablet Take 1 tablet (20 mg total) by mouth daily. 15 Mins. before evening meal   rosuvastatin (CRESTOR) 20 MG tablet Take 1 tablet (20 mg total) by mouth daily.   Semaglutide,0.25 or 0.'5MG'$ /DOS, 2 MG/1.5ML SOPN Inject 0.5 mg  into the skin once a week.   sucralfate (CARAFATE) 1 g tablet Take 1 tablet (1 g total) by mouth 2 (two) times daily. 15 Minutes before evening meal and at bed time   tiotropium (SPIRIVA) 18 MCG inhalation capsule Place 1 capsule (18 mcg total) into inhaler and inhale daily.   No facility-administered medications prior to visit.    Review of Systems  Constitutional:  Negative for chills, diaphoresis, fatigue and fever.  HENT:  Positive for congestion, postnasal drip, sinus pressure, sinus pain, sore throat and trouble swallowing. Negative for ear discharge, ear pain, facial swelling, rhinorrhea, sneezing, tinnitus and voice change.   Respiratory:  Positive for wheezing (chronic but unchanged). Negative for cough, chest tightness, shortness of breath and stridor.   Cardiovascular:  Negative for chest pain, palpitations and leg swelling.  Gastrointestinal:  Negative for abdominal pain and diarrhea.  Musculoskeletal:  Negative for myalgias.  Neurological:  Negative for headaches.     Objective    There were no vitals taken for this visit.   Physical Exam: WDWN female in no apparent distress.  Head: Normocephalic, atraumatic. Neck: Supple, NROM Respiratory: No apparent distress Psych: Normal mood and affect Throat: No exudates or white patches identified.     Assessment & Plan    1. Sore throat Swollen scratchy throat sensation with PND over the past week. No fever, cough, earache or loss of taste. COVID test negative at onset. Will treat with antibiotic and may use warm saltwater gargles for sore throat that is worse in the evenings.  - amoxicillin (AMOXIL) 875 MG tablet; Take 1 tablet (875 mg total) by mouth 2 (two) times daily for 10 days.  Dispense: 20 tablet; Refill: 0  2. URI, acute No cough but some PND felt. Some sneezing and history of allergic rhinitis. History of DM, CAD and tobacco use disorder. May use Albuterol inhaler prn with antihistamine and Flonase. Continue Breo  daily. Add antibiotic and Mucinex DM prn. May need to repeat COVID test if symptoms persist or worsen. - amoxicillin (AMOXIL) 875 MG tablet; Take 1 tablet (875 mg total) by mouth 2 (two) times daily for 10 days.  Dispense: 20 tablet; Refill: 0    No follow-ups on file.     I discussed the assessment and treatment plan with the patient. The patient was provided an opportunity to ask questions and all were answered. The patient agreed with the plan and  demonstrated an understanding of the instructions.   The patient was advised to call back or seek an in-person evaluation if the symptoms worsen or if the condition fails to improve as anticipated.  I provided 20 minutes of non-face-to-face time during this encounter.  I, Jossilyn Benda, PA-C, have reviewed all documentation for this visit. The documentation on 10/27/20 for the exam, diagnosis, procedures, and orders are all accurate and complete.   Vernie Murders, PA-C [employer] [phone] (phone) [phone] (fax)  [place]

## 2020-10-28 DIAGNOSIS — K219 Gastro-esophageal reflux disease without esophagitis: Secondary | ICD-10-CM | POA: Diagnosis not present

## 2020-10-28 DIAGNOSIS — E785 Hyperlipidemia, unspecified: Secondary | ICD-10-CM | POA: Diagnosis not present

## 2020-10-28 DIAGNOSIS — D649 Anemia, unspecified: Secondary | ICD-10-CM | POA: Diagnosis not present

## 2020-10-28 DIAGNOSIS — M75101 Unspecified rotator cuff tear or rupture of right shoulder, not specified as traumatic: Secondary | ICD-10-CM | POA: Diagnosis not present

## 2020-10-28 DIAGNOSIS — E119 Type 2 diabetes mellitus without complications: Secondary | ICD-10-CM | POA: Diagnosis not present

## 2020-10-28 DIAGNOSIS — I5189 Other ill-defined heart diseases: Secondary | ICD-10-CM | POA: Diagnosis not present

## 2020-10-30 DIAGNOSIS — H409 Unspecified glaucoma: Secondary | ICD-10-CM | POA: Diagnosis not present

## 2020-10-30 DIAGNOSIS — N289 Disorder of kidney and ureter, unspecified: Secondary | ICD-10-CM | POA: Diagnosis not present

## 2020-10-30 DIAGNOSIS — K219 Gastro-esophageal reflux disease without esophagitis: Secondary | ICD-10-CM | POA: Diagnosis not present

## 2020-10-30 DIAGNOSIS — E119 Type 2 diabetes mellitus without complications: Secondary | ICD-10-CM | POA: Diagnosis not present

## 2020-10-30 DIAGNOSIS — E785 Hyperlipidemia, unspecified: Secondary | ICD-10-CM | POA: Diagnosis not present

## 2020-10-30 DIAGNOSIS — Z8673 Personal history of transient ischemic attack (TIA), and cerebral infarction without residual deficits: Secondary | ICD-10-CM | POA: Diagnosis not present

## 2020-10-30 DIAGNOSIS — I5189 Other ill-defined heart diseases: Secondary | ICD-10-CM | POA: Diagnosis not present

## 2020-10-30 DIAGNOSIS — Z794 Long term (current) use of insulin: Secondary | ICD-10-CM | POA: Diagnosis not present

## 2020-10-30 DIAGNOSIS — Z7982 Long term (current) use of aspirin: Secondary | ICD-10-CM | POA: Diagnosis not present

## 2020-10-30 DIAGNOSIS — M75101 Unspecified rotator cuff tear or rupture of right shoulder, not specified as traumatic: Secondary | ICD-10-CM | POA: Diagnosis not present

## 2020-10-30 DIAGNOSIS — Z9181 History of falling: Secondary | ICD-10-CM | POA: Diagnosis not present

## 2020-10-30 DIAGNOSIS — Z79891 Long term (current) use of opiate analgesic: Secondary | ICD-10-CM | POA: Diagnosis not present

## 2020-10-30 DIAGNOSIS — D649 Anemia, unspecified: Secondary | ICD-10-CM | POA: Diagnosis not present

## 2020-10-30 DIAGNOSIS — Z7902 Long term (current) use of antithrombotics/antiplatelets: Secondary | ICD-10-CM | POA: Diagnosis not present

## 2020-10-30 DIAGNOSIS — M1990 Unspecified osteoarthritis, unspecified site: Secondary | ICD-10-CM | POA: Diagnosis not present

## 2020-10-30 DIAGNOSIS — F17210 Nicotine dependence, cigarettes, uncomplicated: Secondary | ICD-10-CM | POA: Diagnosis not present

## 2020-11-02 ENCOUNTER — Other Ambulatory Visit: Payer: Self-pay

## 2020-11-02 ENCOUNTER — Encounter: Payer: Self-pay | Admitting: Family Medicine

## 2020-11-02 DIAGNOSIS — F1190 Opioid use, unspecified, uncomplicated: Secondary | ICD-10-CM

## 2020-11-02 DIAGNOSIS — M48061 Spinal stenosis, lumbar region without neurogenic claudication: Secondary | ICD-10-CM

## 2020-11-02 NOTE — Telephone Encounter (Signed)
Patient sent mychart message requesting refill.

## 2020-11-03 MED ORDER — HYDROCODONE-ACETAMINOPHEN 5-325 MG PO TABS: 1.0000 | ORAL_TABLET | Freq: Four times a day (QID) | ORAL | 0 refills | Status: DC | PRN

## 2020-11-05 DIAGNOSIS — K219 Gastro-esophageal reflux disease without esophagitis: Secondary | ICD-10-CM | POA: Diagnosis not present

## 2020-11-05 DIAGNOSIS — D649 Anemia, unspecified: Secondary | ICD-10-CM | POA: Diagnosis not present

## 2020-11-05 DIAGNOSIS — E119 Type 2 diabetes mellitus without complications: Secondary | ICD-10-CM | POA: Diagnosis not present

## 2020-11-05 DIAGNOSIS — E785 Hyperlipidemia, unspecified: Secondary | ICD-10-CM | POA: Diagnosis not present

## 2020-11-05 DIAGNOSIS — M75101 Unspecified rotator cuff tear or rupture of right shoulder, not specified as traumatic: Secondary | ICD-10-CM | POA: Diagnosis not present

## 2020-11-05 DIAGNOSIS — I5189 Other ill-defined heart diseases: Secondary | ICD-10-CM | POA: Diagnosis not present

## 2020-11-11 DIAGNOSIS — M75101 Unspecified rotator cuff tear or rupture of right shoulder, not specified as traumatic: Secondary | ICD-10-CM | POA: Diagnosis not present

## 2020-11-11 DIAGNOSIS — K219 Gastro-esophageal reflux disease without esophagitis: Secondary | ICD-10-CM | POA: Diagnosis not present

## 2020-11-11 DIAGNOSIS — E785 Hyperlipidemia, unspecified: Secondary | ICD-10-CM | POA: Diagnosis not present

## 2020-11-11 DIAGNOSIS — I5189 Other ill-defined heart diseases: Secondary | ICD-10-CM | POA: Diagnosis not present

## 2020-11-11 DIAGNOSIS — D649 Anemia, unspecified: Secondary | ICD-10-CM | POA: Diagnosis not present

## 2020-11-11 DIAGNOSIS — E119 Type 2 diabetes mellitus without complications: Secondary | ICD-10-CM | POA: Diagnosis not present

## 2020-11-16 DIAGNOSIS — D649 Anemia, unspecified: Secondary | ICD-10-CM | POA: Diagnosis not present

## 2020-11-16 DIAGNOSIS — I5189 Other ill-defined heart diseases: Secondary | ICD-10-CM | POA: Diagnosis not present

## 2020-11-16 DIAGNOSIS — E785 Hyperlipidemia, unspecified: Secondary | ICD-10-CM | POA: Diagnosis not present

## 2020-11-16 DIAGNOSIS — K219 Gastro-esophageal reflux disease without esophagitis: Secondary | ICD-10-CM | POA: Diagnosis not present

## 2020-11-16 DIAGNOSIS — M75101 Unspecified rotator cuff tear or rupture of right shoulder, not specified as traumatic: Secondary | ICD-10-CM | POA: Diagnosis not present

## 2020-11-16 DIAGNOSIS — E119 Type 2 diabetes mellitus without complications: Secondary | ICD-10-CM | POA: Diagnosis not present

## 2020-11-18 ENCOUNTER — Ambulatory Visit: Payer: Medicare Other

## 2020-11-19 ENCOUNTER — Ambulatory Visit (INDEPENDENT_AMBULATORY_CARE_PROVIDER_SITE_OTHER): Payer: Medicare Other

## 2020-11-19 DIAGNOSIS — Z794 Long term (current) use of insulin: Secondary | ICD-10-CM

## 2020-11-19 DIAGNOSIS — I10 Essential (primary) hypertension: Secondary | ICD-10-CM

## 2020-11-19 DIAGNOSIS — E1121 Type 2 diabetes mellitus with diabetic nephropathy: Secondary | ICD-10-CM

## 2020-11-19 DIAGNOSIS — J449 Chronic obstructive pulmonary disease, unspecified: Secondary | ICD-10-CM

## 2020-11-19 NOTE — Chronic Care Management (AMB) (Signed)
Chronic Care Management   CCM RN Visit Note  11/19/2020 Name: Hannah Kirby MRN: [id number] DOB: 27-Dec-1945  Subjective: Hannah Kirby is a 75 y.o. year old female who is a primary care patient of Fisher, Kirstie Peri, MD. The care management team was consulted for assistance with disease management and care coordination needs.    Engaged with patient by telephone for follow up visit in response to provider referral for case management and care coordination services.   Consent to Services:  The patient was given information about Chronic Care Management services, agreed to services, and gave verbal consent prior to initiation of services.  Please see initial visit note for detailed documentation.    Assessment: Review of patient past medical history, allergies, medications, health status, including review of consultants reports, laboratory and other test data, was performed as part of comprehensive evaluation and provision of chronic care management services.   SDOH (Social Determinants of Health) assessments and interventions performed:  No  CCM Care Plan  Allergies  Allergen Reactions   Spironolactone Shortness Of Breath   Sulfonamide Derivatives Other (See Comments)    Last taken as a child; made her mouth break out   Other Other (See Comments)    Mouth blisters   Sulfa Antibiotics Other (See Comments)    Mouth blisters Welts on mouth    Codeine Nausea And Vomiting and Other (See Comments)    Nausea   Prednisone Palpitations and Other (See Comments)    Made her legs "feel weird."    Outpatient Encounter Medications as of 11/19/2020  Medication Sig   acetaminophen (TYLENOL) 500 MG tablet Take 500 mg by mouth every 6 (six) hours as needed.   albuterol (VENTOLIN HFA) 108 (90 Base) MCG/ACT inhaler Inhale 2 puffs into the lungs every 4 (four) hours as needed for wheezing or shortness of breath.   amLODipine (NORVASC) 5 MG tablet Take 1 tablet (5 mg total) by mouth daily.    aspirin (ASPIR-81) 81 MG EC tablet Take 81 mg by mouth daily.   clopidogrel (PLAVIX) 75 MG tablet Take 1 tablet (75 mg total) by mouth daily.   cyanocobalamin 1000 MCG tablet Take 1,000 mcg by mouth daily.   ezetimibe (ZETIA) 10 MG tablet Take 1 tablet (10 mg total) by mouth daily.   fluticasone furoate-vilanterol (BREO ELLIPTA) 100-25 MCG/INH AEPB Inhale 1 puff into the lungs daily.   HYDROcodone-acetaminophen (NORCO/VICODIN) 5-325 MG tablet Take 1 tablet by mouth every 6 (six) hours as needed.   insulin glargine (LANTUS) 100 UNIT/ML injection Inject 22 Units into the skin daily.    lisinopril (ZESTRIL) 20 MG tablet Take 1 tablet (20 mg total) by mouth daily.   metoprolol tartrate (LOPRESSOR) 100 MG tablet Take 1 tablet (100 mg total) by mouth 2 (two) times daily.   Multiple Vitamins-Minerals (DAILY MULTI) TABS Take 1 tablet by mouth daily.   nitroGLYCERIN (NITROSTAT) 0.4 MG SL tablet Place 1 tablet (0.4 mg total) under the tongue every 5 (five) minutes as needed.   Omega-3 Fatty Acids (FISH OIL) 1000 MG CAPS Take 2 capsules by mouth in the morning and at bedtime.    ondansetron (ZOFRAN ODT) 4 MG disintegrating tablet Take 1 tablet (4 mg total) by mouth every 8 (eight) hours as needed for nausea or vomiting.   RABEprazole (ACIPHEX) 20 MG tablet Take 1 tablet (20 mg total) by mouth daily. 15 Mins. before evening meal   rosuvastatin (CRESTOR) 20 MG tablet Take 1 tablet (20 mg total) by  mouth daily.   Semaglutide,0.25 or 0.5MG/DOS, 2 MG/1.5ML SOPN Inject 0.5 mg into the skin once a week.   sucralfate (CARAFATE) 1 g tablet Take 1 tablet (1 g total) by mouth 2 (two) times daily. 15 Minutes before evening meal and at bed time   tiotropium (SPIRIVA) 18 MCG inhalation capsule Place 1 capsule (18 mcg total) into inhaler and inhale daily.   No facility-administered encounter medications on file as of 11/19/2020.    Patient Active Problem List   Diagnosis Date Noted   Restrictive airway disease  07/21/2020   Retinal detachment, left 03/04/2020   Chronic diastolic congestive heart failure ([place]) 11/20/2019   Polyp of ascending colon    Acute hemorrhagic colitis 08/08/2019   Type II diabetes mellitus with renal manifestations ([place]) 08/08/2019   Hypokalemia 08/08/2019   TIA (transient ischemic attack) 08/01/2019   Benign hypertensive kidney disease with chronic kidney disease 04/25/2019   Type 2 diabetes mellitus with diabetic chronic kidney disease ([place]) 04/25/2019   Secondary hyperparathyroidism of renal origin ([place]) 10/24/2018   Chronic, continuous use of opioids 03/26/2018   History of adenomatous polyp of colon 01/22/2018   Ulceration of colon determined by endoscopy 01/22/2018   Diverticulosis of colon 01/22/2018   History of CVA (cerebrovascular accident) 11/08/2016   Weakness of left upper extremity 10/30/2016   AAA (abdominal aortic aneurysm) without rupture ([place]) 08/12/2016   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   Stage 3b chronic kidney disease ([place]) 07/27/2015   Chest pain at rest 05/06/2015   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[place]) 10/28/2014   Solitary pulmonary nodule on lung CT 08/20/2014   Allergic rhinitis 07/28/2014   CAFL (chronic airflow limitation) ([place]) 07/28/2014   Acid reflux 07/28/2014   Obesity    Granuloma annulare    Back pain 11/12/2013   Scoliosis 10/30/2013   Lumbar radiculopathy 10/30/2013   Lumbar canal stenosis 10/30/2013   Calcium blood increased 10/22/2013   Proteinuria 10/22/2013   Tobacco use disorder 03/21/2011   Obese 03/21/2011   Edema 08/18/2010   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   Coronary artery disease 03/25/2009   Essential hypertension 03/25/2009    Conditions to be addressed/monitored:HTN, DMII, and Chronic Airflow Limitation Patient Care Plan: Heart Failure (Adult)     Problem Identified: Symptom Exacerbation (Heart Failure)      Long-Range Goal: Symptom Exacerbation Prevented or  Minimized   Start Date: 04/01/2020  Expected End Date: 07/30/2020  Priority: High  Note:   Wt Readings from Last 3 Encounters:  05/01/20 165 lb (74.8 kg)  04/01/20 166 lb (75.3 kg)  03/31/20 171 lb (77.6 kg)     Current Barriers:  Chronic Disease Management needs and support r/t Heart Failure  Nurse Case Manager Clinical Goal(s): Over the next 120 days, patient will not require hospitalization or emergent care d/t complications r/t Heart Failure exacerbation.  Interventions:  Collaboration with Birdie Sons, MD regarding development and update of comprehensive plan of care as evidenced by provider attestation and co-signature Inter-disciplinary care team collaboration (see longitudinal plan of care) Reviewed medications and discussed current treatment plan. Reports excellent compliance with medications. Reviewed weight parameters. Reports weights have remained stable. Continues to experience dyspnea with exertion but notes improvements with activity tolerance. She reports receiving Physical therapy in the home. Denies episodes of chest discomfort of palpitations. Denies abdominal or lower extremity edema. Advised to continue monitoring weights and assessing symptoms. Reviewed s/sx of exacerbation along with indications for seeking immediate medical attention.  Patient Goals/Self-Care Activities Take  medications as prescribed Attend medical appointments as scheduled Monitor and record weight.  Notify Cardiologist or PCP for weight gain outside of established parameters. Adhere to a low sodium diet and follow Heart failure plan as advised by the Cardiology team.   Goal Met    Patient Care Plan: Diabetes Type 2 (Adult)     Problem Identified: Glycemic Management (Diabetes, Type 2)      Long-Range Goal: Glycemic Management Optimized Completed 11/19/2020  Start Date: 04/01/2020  Expected End Date: 07/30/2020  Priority: High  Note:    Current Barriers:  Chronic Disease Management  needs and support related to Diabetes self-management   Case Manager Clinical Goal(s):  Over the next 120 days, patient will demonstrate adherence to prescribed treatment plan for Diabetes self management as evidenced by daily monitoring and recording of CBG, adherence to ADA/ carb modified diet and adherence to prescribed medication regimen.  Interventions:  Collaboration with Birdie Sons, MD regarding development and update of comprehensive plan of care as evidenced by provider attestation and co-signature Inter-disciplinary care team collaboration (see longitudinal plan of care) Reviewed blood glucose readings and compliance with current treatment plan. Continues to take medications as prescribed and monitor blood glucose levels as advised. Reports improvements with fasting readings. Reports recent readings have ranged in the 90's and low 100's. Reading today was 109 mg/dl. Reports improvements with dietary intake. Encouraged to continue monitoring and recording readings. Reviewed s/sx of hypoglycemia and hyperglycemia along with recommended interventions.   Patient Goals/Self-Care Activities Self administer oral medications and insulin as prescribed Attend all scheduled provider appointments Monitor blood glucose levels consistently and utilize recommended interventions Adhere to prescribed ADA/carb modified       Patient Care Plan: Fall Risk (Adult)     Problem Identified: Fall Risk      Long-Range Goal: Absence of Fall and Fall-Related Injury   Start Date: 11/19/2020  Expected End Date: 02/17/2021  Recent Progress: Not on track  Priority: High  Note:    Current Barriers:  Moderate risk for Falls d/t fall history and medication side effects.  Clinical Goal(s): (Reset) Over the next 60 days, patient will not experience falls or require hospitalization d/t fall related injuries   Interventions:  Collaboration with Caryn Section, Kirstie Peri, MD regarding development and  update of comprehensive plan of care as evidenced by provider attestation and co-signature Inter-disciplinary care team collaboration (see longitudinal plan of care) Assessed for falls since the last outreach. Denies recent falls. Reports following recommended safety and fall prevention measures. Reports improvements with activity tolerance since working with the home health physical therapy team. Advised to continue to keep an assistive device available for use when ambulating. Advised to follow activity recommendations and restrictions as instructed by the physical therapy team. Discussed ability to perform ADL's and tasks in the home. Continues to perform ADL's and small tasks in the home independently. Family members remain readily available to assist as needed.   Patient Goals/Self-Care Activities   Utilize assistive appropriately with all ambulation Change positions slowly Wear secure non-skid footwear at all times with ambulation Adhere to activities and restrictions as advised by the Physical Therapy team Ensure pathways are clear and utilize home lighting for dim lit areas    Follow Up Plan:  Will follow up in three months    Patient Care Plan: Chronic Airflow Limitation     Problem Identified: Symptom Exacerbation (Chronic Airflow Limitation)      Long-Range Goal: Symptom Exacerbation Prevented or Minimized   Start  Date: 11/19/2020  Expected End Date: 02/17/2021  Priority: High  Note:   Current Barriers:  Chronic Disease Management support and educational needs related to Chronic Airflow Limitation.  Case Manager Clinical Goal(s): Over the next 90 days, patient will not require hospitalization or emergent care r/t respiratory complications.  Interventions:  Collaboration with Birdie Sons, MD regarding development and update of comprehensive plan of care as evidenced by provider attestation and co-signature Inter-disciplinary care team collaboration (see longitudinal  plan of care) Discussed current treatment plan. Reports taking medications and monitoring symptoms as advised. Reports previously experiencing nasal congestion, sinus congestion and sore throat. Denies cough, fevers or  shortness of breath. Reports symptoms have resolved. Thoroughly discussed indications for seeking medical attention if symptoms return and worsen. Discussed importance of daily self assessment. Discussed symptoms usually experienced during season changes. Advised to make an appointment with provider if experiencing moderate symptoms for 48 hours without improvement.  Advised to utilize recommended prevention strategies to reduce risk of respiratory infection.   Self-Care Activities/Patient Goals: Take medications as prescribed Assess symptoms daily and notify provider if experiencing moderate symptoms for 48 hrs without improvement Follow recommendations to prevent respiratory infection Notify provider or care management team with questions and new concerns as needed   Follow Up Plan:  Will follow up in three months       PLAN A member of the care management team will follow up in three months.   Cristy [facility] Care Management [facility] [phone]

## 2020-11-19 NOTE — Patient Instructions (Addendum)
Thank you for allowing the Chronic Care Management team to participate in your care.    Patient Care Plan: Heart Failure (Adult)     Problem Identified: Symptom Exacerbation (Heart Failure)      Long-Range Goal: Symptom Exacerbation Prevented or Minimized   Start Date: 04/01/2020  Expected End Date: 07/30/2020  Priority: High  Note:   Wt Readings from Last 3 Encounters:  05/01/20 165 lb (74.8 kg)  04/01/20 166 lb (75.3 kg)  03/31/20 171 lb (77.6 kg)     Current Barriers:  Chronic Disease Management needs and support r/t Heart Failure  Nurse Case Manager Clinical Goal(s): Over the next 120 days, patient will not require hospitalization or emergent care d/t complications r/t Heart Failure exacerbation.  Interventions:  Collaboration with Birdie Sons, MD regarding development and update of comprehensive plan of care as evidenced by provider attestation and co-signature Inter-disciplinary care team collaboration (see longitudinal plan of care) Reviewed medications and discussed current treatment plan. Reports excellent compliance with medications. Reviewed weight parameters. Reports weights have remained stable. Continues to experience dyspnea with exertion but notes improvements with activity tolerance. She reports receiving Physical therapy in the home. Denies episodes of chest discomfort of palpitations. Denies abdominal or lower extremity edema. Advised to continue monitoring weights and assessing symptoms. Reviewed s/sx of exacerbation along with indications for seeking immediate medical attention.  Patient Goals/Self-Care Activities Take medications as prescribed Attend medical appointments as scheduled Monitor and record weight.  Notify Cardiologist or PCP for weight gain outside of established parameters. Adhere to a low sodium diet and follow Heart failure plan as advised by the Cardiology team.   Goal Met    Patient Care Plan: Diabetes Type 2 (Adult)     Problem  Identified: Glycemic Management (Diabetes, Type 2)      Long-Range Goal: Glycemic Management Optimized Completed 11/19/2020  Start Date: 04/01/2020  Expected End Date: 07/30/2020  Priority: High  Note:    Current Barriers:  Chronic Disease Management needs and support related to Diabetes self-management   Case Manager Clinical Goal(s):  Over the next 120 days, patient will demonstrate adherence to prescribed treatment plan for Diabetes self management as evidenced by daily monitoring and recording of CBG, adherence to ADA/ carb modified diet and adherence to prescribed medication regimen.  Interventions:  Collaboration with Birdie Sons, MD regarding development and update of comprehensive plan of care as evidenced by provider attestation and co-signature Inter-disciplinary care team collaboration (see longitudinal plan of care) Reviewed blood glucose readings and compliance with current treatment plan. Continues to take medications as prescribed and monitor blood glucose levels as advised. Reports improvements with fasting readings. Reports recent readings have ranged in the 90's and low 100's. Reading today was 109 mg/dl. Reports improvements with dietary intake. Encouraged to continue monitoring and recording readings. Reviewed s/sx of hypoglycemia and hyperglycemia along with recommended interventions.   Patient Goals/Self-Care Activities Self administer oral medications and insulin as prescribed Attend all scheduled provider appointments Monitor blood glucose levels consistently and utilize recommended interventions Adhere to prescribed ADA/carb modified       Patient Care Plan: Fall Risk (Adult)     Problem Identified: Fall Risk      Long-Range Goal: Absence of Fall and Fall-Related Injury   Start Date: 11/19/2020  Expected End Date: 02/17/2021  Recent Progress: Not on track  Priority: High  Note:    Current Barriers:  Moderate risk for Falls d/t fall history and  medication side effects.  Clinical Goal(s): (  Reset) Over the next 60 days, patient will not experience falls or require hospitalization d/t fall related injuries   Interventions:  Collaboration with Caryn Section Kirstie Peri, MD regarding development and update of comprehensive plan of care as evidenced by provider attestation and co-signature Inter-disciplinary care team collaboration (see longitudinal plan of care) Assessed for falls since the last outreach. Denies recent falls. Reports following recommended safety and fall prevention measures. Reports improvements with activity tolerance since working with the home health physical therapy team. Advised to continue to keep an assistive device available for use when ambulating. Advised to follow activity recommendations and restrictions as instructed by the physical therapy team. Discussed ability to perform ADL's and tasks in the home. Continues to perform ADL's and small tasks in the home independently. Family members remain readily available to assist as needed.   Patient Goals/Self-Care Activities   Utilize assistive appropriately with all ambulation Change positions slowly Wear secure non-skid footwear at all times with ambulation Adhere to activities and restrictions as advised by the Physical Therapy team Ensure pathways are clear and utilize home lighting for dim lit areas    Follow Up Plan:  Will follow up in three months    Patient Care Plan: Chronic Airflow Limitation     Problem Identified: Symptom Exacerbation (Chronic Airflow Limitation)      Long-Range Goal: Symptom Exacerbation Prevented or Minimized   Start Date: 11/19/2020  Expected End Date: 02/17/2021  Priority: High  Note:   Current Barriers:  Chronic Disease Management support and educational needs related to Chronic Airflow Limitation.  Case Manager Clinical Goal(s): Over the next 90 days, patient will not require hospitalization or emergent care r/t respiratory  complications.  Interventions:  Collaboration with Birdie Sons, MD regarding development and update of comprehensive plan of care as evidenced by provider attestation and co-signature Inter-disciplinary care team collaboration (see longitudinal plan of care) Discussed current treatment plan. Reports taking medications and monitoring symptoms as advised. Reports previously experiencing nasal congestion, sinus congestion and sore throat. Denies cough, fevers or  shortness of breath. Reports symptoms have resolved. Thoroughly discussed indications for seeking medical attention if symptoms return and worsen. Discussed importance of daily self assessment. Discussed symptoms usually experienced during season changes. Advised to make an appointment with provider if experiencing moderate symptoms for 48 hours without improvement.  Advised to utilize recommended prevention strategies to reduce risk of respiratory infection.   Self-Care Activities/Patient Goals: Take medications as prescribed Assess symptoms daily and notify provider if experiencing moderate symptoms for 48 hrs without improvement Follow recommendations to prevent respiratory infection Notify provider or care management team with questions and new concerns as needed   Follow Up Plan:  Will follow up in three months       Mrs. Hilliker verbalized understanding of the information discussed during the telephonic outreach. Declined need for mailed/printed instructions. A member of the care management team will follow up in three months.   [facility] [phone]

## 2020-11-20 DIAGNOSIS — E1121 Type 2 diabetes mellitus with diabetic nephropathy: Secondary | ICD-10-CM | POA: Diagnosis not present

## 2020-11-20 DIAGNOSIS — Z794 Long term (current) use of insulin: Secondary | ICD-10-CM

## 2020-11-20 DIAGNOSIS — J449 Chronic obstructive pulmonary disease, unspecified: Secondary | ICD-10-CM | POA: Diagnosis not present

## 2020-11-20 DIAGNOSIS — I10 Essential (primary) hypertension: Secondary | ICD-10-CM | POA: Diagnosis not present

## 2020-11-24 DIAGNOSIS — E119 Type 2 diabetes mellitus without complications: Secondary | ICD-10-CM | POA: Diagnosis not present

## 2020-11-24 DIAGNOSIS — E785 Hyperlipidemia, unspecified: Secondary | ICD-10-CM | POA: Diagnosis not present

## 2020-11-24 DIAGNOSIS — D649 Anemia, unspecified: Secondary | ICD-10-CM | POA: Diagnosis not present

## 2020-11-24 DIAGNOSIS — I5189 Other ill-defined heart diseases: Secondary | ICD-10-CM | POA: Diagnosis not present

## 2020-11-24 DIAGNOSIS — K219 Gastro-esophageal reflux disease without esophagitis: Secondary | ICD-10-CM | POA: Diagnosis not present

## 2020-11-24 DIAGNOSIS — M75101 Unspecified rotator cuff tear or rupture of right shoulder, not specified as traumatic: Secondary | ICD-10-CM | POA: Diagnosis not present

## 2020-11-25 ENCOUNTER — Other Ambulatory Visit: Payer: Self-pay

## 2020-11-25 ENCOUNTER — Ambulatory Visit (INDEPENDENT_AMBULATORY_CARE_PROVIDER_SITE_OTHER): Payer: Medicare Other

## 2020-11-25 DIAGNOSIS — Z23 Encounter for immunization: Secondary | ICD-10-CM

## 2020-12-04 ENCOUNTER — Encounter: Payer: Self-pay | Admitting: Family Medicine

## 2020-12-04 ENCOUNTER — Other Ambulatory Visit: Payer: Self-pay

## 2020-12-04 DIAGNOSIS — F1190 Opioid use, unspecified, uncomplicated: Secondary | ICD-10-CM

## 2020-12-04 DIAGNOSIS — M48061 Spinal stenosis, lumbar region without neurogenic claudication: Secondary | ICD-10-CM

## 2020-12-04 MED ORDER — HYDROCODONE-ACETAMINOPHEN 5-325 MG PO TABS: 1.0000 | ORAL_TABLET | Freq: Four times a day (QID) | ORAL | 0 refills | Status: DC | PRN

## 2020-12-04 NOTE — Telephone Encounter (Signed)
Please review. Patient sent mychart message requesting refill.   Last refill: 11/03/2020, #120 with no refills

## 2020-12-08 DIAGNOSIS — S60211A Contusion of right wrist, initial encounter: Secondary | ICD-10-CM | POA: Diagnosis not present

## 2020-12-21 ENCOUNTER — Other Ambulatory Visit: Payer: Self-pay

## 2020-12-21 MED ORDER — LISINOPRIL 20 MG PO TABS: 20.0000 mg | ORAL_TABLET | Freq: Every day | ORAL | 3 refills | Status: DC

## 2020-12-21 MED ORDER — AMLODIPINE BESYLATE 5 MG PO TABS: 5.0000 mg | ORAL_TABLET | Freq: Every day | ORAL | 3 refills | Status: DC

## 2020-12-21 MED ORDER — METOPROLOL TARTRATE 100 MG PO TABS: 100.0000 mg | ORAL_TABLET | Freq: Two times a day (BID) | ORAL | 3 refills | Status: DC

## 2020-12-21 MED ORDER — ROSUVASTATIN CALCIUM 20 MG PO TABS: 20.0000 mg | ORAL_TABLET | Freq: Every day | ORAL | 3 refills | Status: DC

## 2020-12-21 MED ORDER — CLOPIDOGREL BISULFATE 75 MG PO TABS: 75.0000 mg | ORAL_TABLET | Freq: Every day | ORAL | 3 refills | Status: DC

## 2020-12-21 MED ORDER — EZETIMIBE 10 MG PO TABS: 10.0000 mg | ORAL_TABLET | Freq: Every day | ORAL | 3 refills | Status: DC

## 2021-01-01 DIAGNOSIS — M19111 Post-traumatic osteoarthritis, right shoulder: Secondary | ICD-10-CM | POA: Diagnosis not present

## 2021-01-05 ENCOUNTER — Encounter: Payer: Self-pay | Admitting: Family Medicine

## 2021-01-05 DIAGNOSIS — Z8673 Personal history of transient ischemic attack (TIA), and cerebral infarction without residual deficits: Secondary | ICD-10-CM | POA: Diagnosis not present

## 2021-01-05 DIAGNOSIS — R531 Weakness: Secondary | ICD-10-CM | POA: Diagnosis not present

## 2021-01-05 DIAGNOSIS — R200 Anesthesia of skin: Secondary | ICD-10-CM | POA: Diagnosis not present

## 2021-01-05 DIAGNOSIS — J449 Chronic obstructive pulmonary disease, unspecified: Secondary | ICD-10-CM

## 2021-01-05 DIAGNOSIS — M48061 Spinal stenosis, lumbar region without neurogenic claudication: Secondary | ICD-10-CM

## 2021-01-05 DIAGNOSIS — R202 Paresthesia of skin: Secondary | ICD-10-CM | POA: Diagnosis not present

## 2021-01-05 DIAGNOSIS — R519 Headache, unspecified: Secondary | ICD-10-CM | POA: Diagnosis not present

## 2021-01-05 DIAGNOSIS — F1190 Opioid use, unspecified, uncomplicated: Secondary | ICD-10-CM

## 2021-01-05 DIAGNOSIS — M545 Low back pain, unspecified: Secondary | ICD-10-CM | POA: Diagnosis not present

## 2021-01-05 MED ORDER — TIOTROPIUM BROMIDE MONOHYDRATE 18 MCG IN CAPS: 18.0000 ug | ORAL_CAPSULE | Freq: Every day | RESPIRATORY_TRACT | 4 refills | Status: DC

## 2021-01-05 MED ORDER — HYDROCODONE-ACETAMINOPHEN 5-325 MG PO TABS: 1.0000 | ORAL_TABLET | Freq: Four times a day (QID) | ORAL | 0 refills | Status: DC | PRN

## 2021-01-05 NOTE — Telephone Encounter (Signed)
Dr. Caryn Section, I sent in refills for Spiriva, but was unable to pull the [facility] prescription to be refilled. I kept getting this message:   The following medication records are no longer available for ordering. Place a new order with a different medication record. fluticasone furoate-vilanterol (BREO ELLIPTA) 100-25 MCG/INH AEPB

## 2021-01-20 DIAGNOSIS — R109 Unspecified abdominal pain: Secondary | ICD-10-CM | POA: Diagnosis not present

## 2021-01-21 ENCOUNTER — Encounter: Payer: Self-pay | Admitting: Family Medicine

## 2021-01-22 NOTE — Telephone Encounter (Signed)
Please advise patient needs office visit to evaluate this pain. Can see PA or Dr. Argentina Donovan if I don't have any acute visit slots available.

## 2021-01-29 MED ORDER — FLUTICASONE FUROATE-VILANTEROL 100-25 MCG/ACT IN AEPB: 1.0000 | INHALATION_SPRAY | Freq: Every day | RESPIRATORY_TRACT | 3 refills | Status: DC

## 2021-01-29 NOTE — Addendum Note (Signed)
Addended by: Randal Buba on: 01/29/2021 04:28 PM   Modules accepted: Orders

## 2021-02-01 NOTE — Progress Notes (Signed)
Date:  02/02/2021   ID:  Hannah Kirby, DOB Apr 05, 1945, MRN [id number]  Patient Location:  [address] [place] [address]   Provider location:   [facility], [place] office  PCP:  Birdie Sons, MD  Cardiologist:  Arvid Right [facility]   Chief Complaint  Patient presents with   6 month follow up     Patient c/o not having any energy. Medications reviewed by the patient verbally.     History of Present Illness:    Hannah Kirby is a 75 y.o. female past medical history of diabetes,  obesity,  Anxiety, panic attacks significantly improved with  weight loss on  Victoza,  coronary artery disease with stenting of her RCA  in January 2008,  long smoking history who continues to smoke,  hyperlipidemia  outbreak of her granulomatous disease in summary 2013,  prednisone for this condition.  History pinched nerve,  on pain medication. Chronic constipation Chronic shortness of breath, COPD Stroke 2018, June 2021, discharged on aspirin Plavix,  previous strokes on MRI who presents for routine followup of her coronary artery disease and hypertension.  Sedentary Goes to grocery store, gets tired Husband having trouble, son helps No regular exercise program  Continues to have chronic bronchitis though symptoms are stable No chest pain concerning for angina Not on oxygen Continues to smoke 1 pack/day   cardiac catheterization 2020 , stent to the RCA On aspirin Plavix  Lab work reviewed Cholesterol above goal A1C 6.7, down from 8  EKG personally reviewed by myself on todays visit Normal sinus rhythm rate 68 bpm nonspecific ST abnormality  Of past medical history reviewed Cath 03/2020 Severe single-vessel coronary artery disease with 99% distal RCA stenosis. Mild, nonobstructive left coronary artery disease with minimal luminal irregularities involving the LAD and 20-30% ostial ramus stenosis. Patent proximal/mid RCA stents with mild to  moderate in-stent restenosis (~30%). Mildly elevated left ventricular filling pressure (LVEDP 15-20 mmHg). Successful PCI to distal RCA using Resolute Onyx 3.0 x 15 mm drug-eluting stent with 0% residual stenosis and TIMI-3 flow. Right radial artery loop; consider alternative access for future catheterizations.   Recommendations: Remove right femoral artery sheath 2 hours after completion of procedure. Dual antiplatelet therapy with aspirin and clopidogrel for at least 12 months. Aggressive secondary prevention. Obtain echocardiogram. Gentle post catheterization hydration in the setting of chronic kidney disease and mildly elevated left ventricular filling pressure.  Past medical history reviewed  hospital July 2021  with bloody diarrhea, leukocytosis C.diff: Positive Treated with vancomycin  In 2020, torn retina, symptoms started with floaters and vision deficits  had laser treatment, and follow-up with [facility]  CT head: No CT evidence of acute intracranial abnormality. Moderate chronic small vessel ischemic disease with redemonstrated chronic lacunar infarcts in the bilateral basal ganglia, right thalamus and within the pons. Stable, mild generalized parenchymal atrophy.  MRI 1. 8 mm acute lacunar infarct within the right thalamocapsular junction. 2. An additional small acute white matter infarct is questioned within the left corona radiata, as described.  Stress test 11/2017 The study is normal. This is a low risk study. The left ventricular ejection fraction is normal (55-65%).  Previously had side effects on spironolactone, palpitations  On aspirin Plavix for prior stroke 2018   Chronically elevated WBC  hospital with stroke 10/30/2016 Left upper extremity weakness on coming back from the beach while in the car She was on aspirin and Plavix at the time Echocardiogram  normal ejection fractionno significant valve disease, moderate LVH, diastolic dysfunction   MRI  brain: Small acute right basal ganglia region infarct. Prior strokes MRA: negative. Carotid duplex showed bilateral 0-49% stenosis.    Past Medical History:  Diagnosis Date   Anemia    Anxiety    Arthritis    C. difficile colitis 08/09/2019   CAD (coronary artery disease)    a. 02/2006 PCI: BMS x 2 to RCA, cath o/w without significant coronary disease; b. nuclear stress test 07/2014: No ischemia/infarct; c. 11/2017 MV: no isch/infarct, EF 55-65%; d. 03/2020 NSTEMI/PCI: LM nl, LAD min irregs, RI 25, small, LCX nl, RCA 30p/m ISR, 99d (3.0x15 Resolute Onyx DES).   Chronic bronchitis (HCC)    secondary to cigarette smoking   Chronic kidney disease (CKD), stage III (moderate) (HCC)    COPD (chronic obstructive pulmonary disease) (HCC)    Diabetes mellitus    Diastolic dysfunction    a. 07/2019 Echo: EF 60-65%, no rwma, mod LVH. Nl RV size/fxn; b. 03/2020 Echo: EF 60-65%, mod LVH, gr1 DD, nl RV size/fxn, mild AS.   FHx: allergies    GERD (gastroesophageal reflux disease)    Goiter    Granulomatous disease ([place])    Hernia    Hyperlipidemia    Hypertension    Kidney stone on left side 2013   Microalbuminuria    NSTEMI (non-ST elevated myocardial infarction) ([place]) 03/23/2020   Obesity    Panic attacks    PVC's (premature ventricular contractions)    a. 03/2018 Zio: Occas PVCs (2.5%). Triggered events assoc w/ PVC/PAC.   Retinal tear 2020   Smokers' cough ([place])    Spinal stenosis    Stroke ([place]) 10/29/2016   mild left side weakness   Stroke ([place]) 08/01/2019   Tobacco abuse    Past Surgical History:  Procedure Laterality Date   ABDOMINAL HYSTERECTOMY     BREAST SURGERY     CATARACT EXTRACTION W/PHACO Right 03/01/2017   Procedure: CATARACT EXTRACTION PHACO AND INTRAOCULAR LENS PLACEMENT ([place]) RIGHT DIABETIC;  Surgeon: Leandrew Koyanagi, MD;  Location: [place];  Service: Ophthalmology;  Laterality: Right;   CATARACT EXTRACTION W/PHACO Left 03/22/2017   Procedure: CATARACT  EXTRACTION PHACO AND INTRAOCULAR LENS PLACEMENT ([place]) LEFT DIABETIC;  Surgeon: Leandrew Koyanagi, MD;  Location: [place];  Service: Ophthalmology;  Laterality: Left;  Diabetic - insulin and oral meds   COLONOSCOPY WITH PROPOFOL N/A 09/23/2014   Procedure: COLONOSCOPY WITH PROPOFOL;  Surgeon: Lollie Sails, MD;  Location[facility] ENDOSCOPY;  Service: Endoscopy;  Laterality: N/A;   COLONOSCOPY WITH PROPOFOL N/A 01/11/2018   Procedure: COLONOSCOPY WITH PROPOFOL;  Surgeon: Lollie Sails, MD;  Location: [facility] ENDOSCOPY;  Service: Endoscopy;  Laterality: N/A;   COLONOSCOPY WITH PROPOFOL N/A 04/24/2018   Procedure: COLONOSCOPY WITH PROPOFOL;  Surgeon: Lollie Sails, MD;  Location: [facility] ENDOSCOPY;  Service: Endoscopy;  Laterality: N/A;   COLONOSCOPY WITH PROPOFOL N/A 11/19/2019   Procedure: COLONOSCOPY WITH PROPOFOL;  Surgeon: Lucilla Lame, MD;  Location: [facility] ENDOSCOPY;  Service: Endoscopy;  Laterality: N/A;   CORONARY ANGIOPLASTY WITH STENT PLACEMENT  2008   CORONARY STENT INTERVENTION N/A 03/24/2020   Procedure: CORONARY STENT INTERVENTION;  Surgeon: Nelva Bush, MD;  Location: [place] CV LAB;  Service: Cardiovascular;  Laterality: N/A;   ESOPHAGOGASTRODUODENOSCOPY (EGD) WITH PROPOFOL N/A 12/30/2014   Procedure: ESOPHAGOGASTRODUODENOSCOPY (EGD) WITH PROPOFOL;  Surgeon: Lollie Sails, MD;  Location: [facility] ENDOSCOPY;  Service: Endoscopy;  Laterality: N/A;   ESOPHAGOGASTRODUODENOSCOPY (EGD) WITH PROPOFOL  N/A 07/19/2016   Procedure: ESOPHAGOGASTRODUODENOSCOPY (EGD) WITH PROPOFOL;  Surgeon: Lollie Sails, MD;  Location: [facility] ENDOSCOPY;  Service: Endoscopy;  Laterality: N/A;   ESOPHAGOGASTRODUODENOSCOPY (EGD) WITH PROPOFOL N/A 04/24/2018   Procedure: ESOPHAGOGASTRODUODENOSCOPY (EGD) WITH PROPOFOL;  Surgeon: Lollie Sails, MD;  Location: [facility] ENDOSCOPY;  Service: Endoscopy;  Laterality: N/A;   hysterectomy (other)     LEFT HEART CATH AND CORONARY ANGIOGRAPHY N/A 03/24/2020    Procedure: LEFT HEART CATH AND CORONARY ANGIOGRAPHY;  Surgeon: Nelva Bush, MD;  Location: [place] CV LAB;  Service: Cardiovascular;  Laterality: N/A;    Current Outpatient Medications on File Prior to Visit  Medication Sig Dispense Refill   acetaminophen (TYLENOL) 500 MG tablet Take 500 mg by mouth every 6 (six) hours as needed.     albuterol (VENTOLIN HFA) 108 (90 Base) MCG/ACT inhaler Inhale 2 puffs into the lungs every 4 (four) hours as needed for wheezing or shortness of breath. 48 g 3   amLODipine (NORVASC) 5 MG tablet Take 1 tablet (5 mg total) by mouth daily. 90 tablet 3   aspirin (ASPIR-81) 81 MG EC tablet Take 81 mg by mouth daily.     clopidogrel (PLAVIX) 75 MG tablet Take 1 tablet (75 mg total) by mouth daily. 90 tablet 3   cyanocobalamin 1000 MCG tablet Take 1,000 mcg by mouth daily.     ezetimibe (ZETIA) 10 MG tablet Take 1 tablet (10 mg total) by mouth daily. 90 tablet 3   fluticasone furoate-vilanterol (BREO ELLIPTA) 100-25 MCG/ACT AEPB Inhale 1 puff into the lungs daily. 84 each 3   HYDROcodone-acetaminophen (NORCO/VICODIN) 5-325 MG tablet Take 1 tablet by mouth every 6 (six) hours as needed. 120 tablet 0   insulin glargine (LANTUS) 100 UNIT/ML injection Inject 22 Units into the skin daily.      lisinopril (ZESTRIL) 20 MG tablet Take 1 tablet (20 mg total) by mouth daily. 90 tablet 3   metoprolol tartrate (LOPRESSOR) 100 MG tablet Take 1 tablet (100 mg total) by mouth 2 (two) times daily. 180 tablet 3   Multiple Vitamins-Minerals (DAILY MULTI) TABS Take 1 tablet by mouth daily.     nitroGLYCERIN (NITROSTAT) 0.4 MG SL tablet Place 1 tablet (0.4 mg total) under the tongue every 5 (five) minutes as needed. 25 tablet 6   Omega-3 Fatty Acids (FISH OIL) 1000 MG CAPS Take 2 capsules by mouth in the morning and at bedtime.      ondansetron (ZOFRAN ODT) 4 MG disintegrating tablet Take 1 tablet (4 mg total) by mouth every 8 (eight) hours as needed for nausea or vomiting. 20  tablet 0   RABEprazole (ACIPHEX) 20 MG tablet Take 1 tablet (20 mg total) by mouth daily. 15 Mins. before evening meal 90 tablet 3   rosuvastatin (CRESTOR) 20 MG tablet Take 1 tablet (20 mg total) by mouth daily. 90 tablet 3   Semaglutide,0.25 or 0.5MG /DOS, 2 MG/1.5ML SOPN Inject 0.5 mg into the skin once a week.     sucralfate (CARAFATE) 1 g tablet Take 1 tablet (1 g total) by mouth 2 (two) times daily. 15 Minutes before evening meal and at bed time 180 tablet 3   tiotropium (SPIRIVA) 18 MCG inhalation capsule Place 1 capsule (18 mcg total) into inhaler and inhale daily. 90 capsule 4   No current facility-administered medications on file prior to visit.    Allergies:   Spironolactone, Sulfonamide derivatives, Other, Sulfa antibiotics, Codeine, and Prednisone   Social History   Tobacco Use  Smoking status: Every Day    Packs/day: 1.00    Years: 40.00    Pack years: 40.00    Types: Cigarettes   Smokeless tobacco: Never  Vaping Use   Vaping Use: Former  Substance Use Topics   Alcohol use: Yes    Comment: 1 glass of wine every few months on special occassions   Drug use: No      Family Hx: The patient's family history includes Breast cancer in her maternal aunt; Congestive Heart Failure in her mother; Diabetes in her mother; Heart failure in her mother; Lung cancer in her father; Stroke in her mother.  ROS:   Please see the history of present illness.    Review of Systems  Constitutional: Negative.   HENT: Negative.    Respiratory:  Positive for cough and shortness of breath.   Cardiovascular: Negative.   Gastrointestinal: Negative.   Musculoskeletal: Negative.   Skin: Negative.   Neurological: Negative.   Psychiatric/Behavioral: Negative.    All other systems reviewed and are negative.   Labs/Other Tests and Data Reviewed:    Recent Labs: 08/04/2020: ALT 19; BUN 22; Creatinine, Ser 1.28; Hemoglobin 16.9; Platelets 225; Potassium 3.9; Sodium 140; TSH 2.340   Recent  Lipid Panel Lab Results  Component Value Date/Time   CHOL 173 08/04/2020 11:27 AM   TRIG 262 (H) 08/04/2020 11:27 AM   HDL 48 08/04/2020 11:27 AM   CHOLHDL 3.6 08/04/2020 11:27 AM   CHOLHDL 3.5 03/24/2020 03:33 AM   LDLCALC 82 08/04/2020 11:27 AM    Wt Readings from Last 3 Encounters:  02/02/21 163 lb 6 oz (74.1 kg)  08/05/20 166 lb 8 oz (75.5 kg)  08/04/20 168 lb 3.2 oz (76.3 kg)     Exam:    Vital Signs: Vital signs may also be detailed in the HPI BP 112/72 (BP Location: Left Arm, Patient Position: Sitting, Cuff Size: Normal)   Pulse 68   Ht 5\' 6"  (1.676 m)   Wt 163 lb 6 oz (74.1 kg)   SpO2 97%   BMI 26.37 kg/m   Constitutional:  oriented to person, place, and time. No distress.  HENT:  Head: Grossly normal Eyes:  no discharge. No scleral icterus.  Neck: No JVD, no carotid bruits  Cardiovascular: Regular rate and rhythm, no murmurs appreciated Pulmonary/Chest: Clear to auscultation bilaterally, no wheezes or rails Abdominal: Soft.  no distension.  no tenderness.  Musculoskeletal: Normal range of motion Neurological:  normal muscle tone. Coordination normal. No atrophy Skin: Skin warm and dry Psychiatric: normal affect, pleasant Prior 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 ([place]) Recent stent to RCA, on aspirin Plavix,  Stressed compliance of Crestor Zetia, will plan for repeat lipid panel in new year  Dyspnea, unspecified type Deconditioned, long smoking history, COPD Has inhalers Chronic bronchitis Still smoking, smoking cessation techniques discussed  Essential hypertension Blood pressure is well controlled on today's visit. No changes made to the medications.  Renal insufficiency Followed by nephrology, creatinine 1.28  Hyperlipidemia LDL goal <70 Ldl high Crestor 20 and zetia 10, needs recheck  Chronic obstructive pulmonary disease, unspecified COPD type ([place]) Smoking cessation  recommended  chronic bronchitis,. Stable  Diabetes:  A1C 6.8, on oxympic Weight stable  Vision loss Managed at [facility]  CVA: Several strokes in the past , chronic small vessel disease, continues to smoke Crestor, Zetia, goal LDL less than 70   Total encounter time more than 25  minutes  Greater than 50% was spent in counseling and coordination of care with the patient   Signed, Ida Rogue, MD  02/02/2021 10:39 AM    [place] [facility] [address] [place], [place], [place] [address]

## 2021-02-02 ENCOUNTER — Encounter: Payer: Self-pay | Admitting: Cardiovascular Disease

## 2021-02-02 ENCOUNTER — Ambulatory Visit (INDEPENDENT_AMBULATORY_CARE_PROVIDER_SITE_OTHER): Payer: Medicare Other | Admitting: Cardiovascular Disease

## 2021-02-02 ENCOUNTER — Other Ambulatory Visit: Payer: Self-pay

## 2021-02-02 VITALS — BP 112/72 | HR 68 | Ht 66.0 in | Wt 163.4 lb

## 2021-02-02 DIAGNOSIS — I25118 Atherosclerotic heart disease of native coronary artery with other forms of angina pectoris: Secondary | ICD-10-CM | POA: Diagnosis not present

## 2021-02-02 DIAGNOSIS — N183 Chronic kidney disease, stage 3 unspecified: Secondary | ICD-10-CM | POA: Diagnosis not present

## 2021-02-02 DIAGNOSIS — I5032 Chronic diastolic (congestive) heart failure: Secondary | ICD-10-CM

## 2021-02-02 DIAGNOSIS — E785 Hyperlipidemia, unspecified: Secondary | ICD-10-CM | POA: Diagnosis not present

## 2021-02-02 DIAGNOSIS — Z720 Tobacco use: Secondary | ICD-10-CM

## 2021-02-02 DIAGNOSIS — I10 Essential (primary) hypertension: Secondary | ICD-10-CM

## 2021-02-02 DIAGNOSIS — I714 Abdominal aortic aneurysm, without rupture, unspecified: Secondary | ICD-10-CM

## 2021-02-02 DIAGNOSIS — I2510 Atherosclerotic heart disease of native coronary artery without angina pectoris: Secondary | ICD-10-CM

## 2021-02-02 NOTE — Patient Instructions (Addendum)
Medication Instructions:  No changes  If you need a refill on your cardiac medications before your next appointment, please call your pharmacy.    Lab work: No new labs needed   Testing/Procedures: No new testing needed   Follow-Up: At [facility], you and your health needs are our priority.  As part of our continuing mission to provide you with exceptional heart care, we have created designated Provider Care Teams.  These Care Teams include your primary Cardiologist (physician) and Advanced Practice Providers (APPs -  Physician Assistants and Nurse Practitioners) who all work together to provide you with the care you need, when you need it.  You will need a follow up appointment in 6 months  Providers on your designated Care Team:   Christopher Berge, NP Ryan Dunn, PA-C Cadence Furth, PA-C  COVID-19 Vaccine Information can be found at: [email or web address] For questions related to vaccine distribution or appointments, please email [email or web address] or call [phone].   

## 2021-02-08 ENCOUNTER — Encounter: Payer: Self-pay | Admitting: Family Medicine

## 2021-02-08 DIAGNOSIS — M48061 Spinal stenosis, lumbar region without neurogenic claudication: Secondary | ICD-10-CM

## 2021-02-08 DIAGNOSIS — F1190 Opioid use, unspecified, uncomplicated: Secondary | ICD-10-CM

## 2021-02-08 NOTE — Telephone Encounter (Signed)
LOV: 08/04/2020  NOV: 02/19/2021  Last Refill:  01/05/2021 #120 0 Refills    Thanks,   -Mickel Baas

## 2021-02-09 MED ORDER — HYDROCODONE-ACETAMINOPHEN 5-325 MG PO TABS: 1.0000 | ORAL_TABLET | Freq: Four times a day (QID) | ORAL | 0 refills | Status: DC | PRN

## 2021-02-10 ENCOUNTER — Encounter (INDEPENDENT_AMBULATORY_CARE_PROVIDER_SITE_OTHER): Payer: Self-pay | Admitting: Nurse Practitioner

## 2021-02-10 ENCOUNTER — Ambulatory Visit (INDEPENDENT_AMBULATORY_CARE_PROVIDER_SITE_OTHER): Payer: Medicare Other | Admitting: Nurse Practitioner

## 2021-02-10 ENCOUNTER — Ambulatory Visit (INDEPENDENT_AMBULATORY_CARE_PROVIDER_SITE_OTHER): Payer: Medicare Other

## 2021-02-10 ENCOUNTER — Other Ambulatory Visit: Payer: Self-pay

## 2021-02-10 VITALS — BP 143/81 | HR 74 | Resp 16 | Wt 161.0 lb

## 2021-02-10 DIAGNOSIS — I10 Essential (primary) hypertension: Secondary | ICD-10-CM | POA: Diagnosis not present

## 2021-02-10 DIAGNOSIS — I714 Abdominal aortic aneurysm, without rupture, unspecified: Secondary | ICD-10-CM

## 2021-02-10 DIAGNOSIS — E0821 Diabetes mellitus due to underlying condition with diabetic nephropathy: Secondary | ICD-10-CM

## 2021-02-10 DIAGNOSIS — F17200 Nicotine dependence, unspecified, uncomplicated: Secondary | ICD-10-CM | POA: Diagnosis not present

## 2021-02-16 ENCOUNTER — Telehealth: Payer: Self-pay | Admitting: Family Medicine

## 2021-02-16 NOTE — Telephone Encounter (Signed)
Pt is calling to cancel her 02/19/21 AWV. Pt would like to keept the 02/18/21 AWV visit. [phone]

## 2021-02-19 ENCOUNTER — Encounter: Payer: Medicare Other | Admitting: Family Medicine

## 2021-02-20 NOTE — Progress Notes (Signed)
Subjective:    Patient ID: Hannah Kirby, female    DOB: 24-Aug-1945, 75 y.o.   MRN: [id number] Chief Complaint  Patient presents with   Follow-up    Ultrasound follow up    Hannah Kirby is a 75 year old female that returns to the office for surveillance of a known abdominal aortic aneurysm. Patient denies abdominal pain or back pain, no other abdominal complaints. No changes suggesting embolic episodes.   There have been no interval changes in the patient's overall health care since his last visit.  Patient denies amaurosis fugax or TIA symptoms. There is no history of claudication or rest pain symptoms of the lower extremities. The patient denies angina or shortness of breath.   Duplex US of the aorta and iliac arteries shows an AAA measured 3.46 cm with no iliac artery aneurysms.  Essentially no change from previous exam on 02/11/2020.   Review of Systems  All other systems reviewed and are negative.     Objective:   Physical Exam Vitals reviewed.  HENT:     Head: Normocephalic.  Cardiovascular:     Rate and Rhythm: Normal rate.     Pulses: Normal pulses.  Pulmonary:     Effort: Pulmonary effort is normal.  Skin:    General: Skin is warm and dry.  Neurological:     Mental Status: She is alert and oriented to person, place, and time.  Psychiatric:        Mood and Affect: Mood normal.        Behavior: Behavior normal.        Thought Content: Thought content normal.        Judgment: Judgment normal.    BP (!) 143/81 (BP Location: Right Arm)    Pulse 74    Resp 16    Wt 161 lb (73 kg)    BMI 25.99 kg/m   Past Medical History:  Diagnosis Date   Anemia    Anxiety    Arthritis    C. difficile colitis 08/09/2019   CAD (coronary artery disease)    a. 02/2006 PCI: BMS x 2 to RCA, cath o/w without significant coronary disease; b. nuclear stress test 07/2014: No ischemia/infarct; c. 11/2017 MV: no isch/infarct, EF 55-65%; d. 03/2020 NSTEMI/PCI: LM nl, LAD min irregs, RI  25, small, LCX nl, RCA 30p/m ISR, 99d (3.0x15 Resolute Onyx DES).   Chronic bronchitis (HCC)    secondary to cigarette smoking   Chronic kidney disease (CKD), stage III (moderate) (HCC)    COPD (chronic obstructive pulmonary disease) (HCC)    Diabetes mellitus    Diastolic dysfunction    a. 07/2019 Echo: EF 60-65%, no rwma, mod LVH. Nl RV size/fxn; b. 03/2020 Echo: EF 60-65%, mod LVH, gr1 DD, nl RV size/fxn, mild AS.   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 03/2018 [facility]: Occas PVCs (2.5%). Triggered events assoc w/ PVC/PAC.   Retinal tear 2020   Smokers' cough ([place])    Spinal stenosis    Stroke ([place]) 10/29/2016   mild left side weakness   Stroke ([place]) 08/01/2019   Tobacco abuse  Social History   Socioeconomic History   Marital status: Married    Spouse name: Twanda Stakes   Number of children: 3   Years of education: Not on file   Highest education level: 12th grade  Occupational History   Occupation: retired  Tobacco Use   Smoking status: Every Day    Packs/day: 1.00    Years: 40.00    Pack years: 40.00    Types: Cigarettes   Smokeless tobacco: Never  Vaping Use   Vaping Use: Former  Substance and Sexual Activity   Alcohol use: Yes    Comment: 1 glass of wine every few months on special occassions   Drug use: No   Sexual activity: Not on file  Other Topics Concern   Not on file  Social History Narrative   Retired. Married.  Lives in [place] with her husband.  She is not routinely exercising.   Social Determinants of Health   Financial Resource Strain: Low Risk    Difficulty of Paying Living Expenses: Not hard at all  Food Insecurity: No Food Insecurity    Worried About Charity fundraiser in the Last Year: Never true   [place] in the Last Year: Never true  Transportation Needs: No Transportation Needs   Lack of Transportation (Medical): No   Lack of Transportation (Non-Medical): No  Physical Activity: Inactive   Days of Exercise per Week: 0 days   Minutes of Exercise per Session: 0 min  Stress: No Stress Concern Present   Feeling of Stress : Only a little  Social Connections: Moderately Isolated   Frequency of Communication with Friends and Family: More than three times a week   Frequency of Social Gatherings with Friends and Family: Twice a week   Attends Religious Services: Never   Marine scientist or Organizations: No   Attends Music therapist: Never   Marital Status: Married  Human resources officer Violence: Not At Risk   Fear of Current or Ex-Partner: No   Emotionally Abused: No   Physically Abused: No   Sexually Abused: No    Past Surgical History:  Procedure Laterality Date   ABDOMINAL HYSTERECTOMY     BREAST SURGERY     CATARACT EXTRACTION W/PHACO Right 03/01/2017   Procedure: CATARACT EXTRACTION PHACO AND INTRAOCULAR LENS PLACEMENT ([place]) RIGHT DIABETIC;  Surgeon: Leandrew Koyanagi, MD;  Location: [facility];  Service: Ophthalmology;  Laterality: Right;   CATARACT EXTRACTION W/PHACO Left 03/22/2017   Procedure: CATARACT EXTRACTION PHACO AND INTRAOCULAR LENS PLACEMENT ([place]) LEFT DIABETIC;  Surgeon: Leandrew Koyanagi, MD;  Location: [facility];  Service: Ophthalmology;  Laterality: Left;  Diabetic - insulin and oral meds   COLONOSCOPY WITH PROPOFOL N/A 09/23/2014   Procedure: COLONOSCOPY WITH PROPOFOL;  Surgeon: Lollie Sails, MD;  Location: [facility] ENDOSCOPY;  Service: Endoscopy;  Laterality: N/A;   COLONOSCOPY WITH PROPOFOL N/A 01/11/2018   Procedure: COLONOSCOPY WITH PROPOFOL;  Surgeon: Lollie Sails, MD;  Location: [facility] ENDOSCOPY;  Service: Endoscopy;  Laterality: N/A;    COLONOSCOPY WITH PROPOFOL N/A 04/24/2018   Procedure: COLONOSCOPY WITH PROPOFOL;  Surgeon: Lollie Sails, MD;  Location: [facility] ENDOSCOPY;  Service: Endoscopy;  Laterality: N/A;   COLONOSCOPY WITH PROPOFOL N/A 11/19/2019   Procedure: COLONOSCOPY WITH PROPOFOL;  Surgeon: Lucilla Lame, MD;  Location: [facility] ENDOSCOPY;  Service: Endoscopy;  Laterality: N/A;   CORONARY ANGIOPLASTY WITH STENT PLACEMENT  2008   CORONARY STENT INTERVENTION N/A 03/24/2020   Procedure: CORONARY STENT INTERVENTION;  Surgeon: Nelva Bush, MD;  Location: [place] CV LAB;  Service: Cardiovascular;  Laterality: N/A;   ESOPHAGOGASTRODUODENOSCOPY (EGD) WITH PROPOFOL N/A 12/30/2014   Procedure: ESOPHAGOGASTRODUODENOSCOPY (EGD) WITH PROPOFOL;  Surgeon: Lollie Sails, MD;  Location: [facility] ENDOSCOPY;  Service: Endoscopy;  Laterality: N/A;   ESOPHAGOGASTRODUODENOSCOPY (EGD) WITH PROPOFOL N/A 07/19/2016   Procedure: ESOPHAGOGASTRODUODENOSCOPY (EGD) WITH PROPOFOL;  Surgeon: Lollie Sails, MD;  Location: [facility] ENDOSCOPY;  Service: Endoscopy;  Laterality: N/A;   ESOPHAGOGASTRODUODENOSCOPY (EGD) WITH PROPOFOL N/A 04/24/2018   Procedure: ESOPHAGOGASTRODUODENOSCOPY (EGD) WITH PROPOFOL;  Surgeon: Lollie Sails, MD;  Location: [facility] ENDOSCOPY;  Service: Endoscopy;  Laterality: N/A;   hysterectomy (other)     LEFT HEART CATH AND CORONARY ANGIOGRAPHY N/A 03/24/2020   Procedure: LEFT HEART CATH AND CORONARY ANGIOGRAPHY;  Surgeon: Nelva Bush, MD;  Location: [place] CV LAB;  Service: Cardiovascular;  Laterality: N/A;    Family History  Problem Relation Age of Onset   Heart failure Mother    Stroke Mother    Diabetes Mother    Congestive Heart Failure Mother    Lung cancer Father    Breast cancer Maternal Aunt     Allergies  Allergen Reactions   Spironolactone Shortness Of Breath   Sulfonamide Derivatives Other (See Comments)    Last taken as a child; made her mouth break out   Other Other (See Comments)     Mouth blisters   Sulfa Antibiotics Other (See Comments)    Mouth blisters Welts on mouth    Codeine Nausea And Vomiting and Other (See Comments)    Nausea   Prednisone Palpitations and Other (See Comments)    Made her legs "feel weird."    CBC Latest Ref Rng & Units 08/04/2020 03/31/2020 03/25/2020  WBC 3.4 - 10.8 x10E3/uL 9.6 9.0 9.8  Hemoglobin 11.1 - 15.9 g/dL 16.9(H) 16.4(H) 15.3(H)  Hematocrit 34.0 - 46.6 % 49.9(H) 47.7(H) 46.1(H)  Platelets 150 - 450 x10E3/uL 225 232 183      CMP     Component Value Date/Time   NA 140 08/04/2020 1127   K 3.9 08/04/2020 1127   CL 101 08/04/2020 1127   CO2 24 08/04/2020 1127   GLUCOSE 101 (H) 08/04/2020 1127   GLUCOSE 152 (H) 03/25/2020 0354   BUN 22 08/04/2020 1127   CREATININE 1.28 (H) 08/04/2020 1127   CALCIUM 10.4 (H) 08/04/2020 1127   PROT 7.1 08/04/2020 1127   ALBUMIN 4.4 08/04/2020 1127   AST 20 08/04/2020 1127   ALT 19 08/04/2020 1127   ALKPHOS 125 (H) 08/04/2020 1127   BILITOT 0.3 08/04/2020 1127   GFRNONAA 47 (L) 03/31/2020 1034   GFRNONAA 57 (L) 03/25/2020 0354   GFRAA 55 (L) 03/31/2020 1034     No results found.     Assessment & Plan:   1. Abdominal aortic aneurysm (AAA) without rupture, unspecified part No surgery or intervention at this time. The patient has an asymptomatic abdominal aortic aneurysm that is less than 4 cm in maximal diameter.  I have discussed the natural history of abdominal aortic aneurysm and the small risk of rupture for aneurysm less than 5 cm in size.  However, as these small aneurysms tend to enlarge over time, continued surveillance with ultrasound or CT scan is mandatory.  I have also discussed optimizing medical management with hypertension and lipid control and the importance of abstinence from tobacco.  The patient is also encouraged to exercise a minimum of 30 minutes 4 times a week.  Should the  patient develop new onset abdominal or back pain or signs of peripheral embolization they are  instructed to seek medical attention immediately and to alert the physician providing care that they have an aneurysm.  The patient voices their understanding. The patient will return in 12 months with an aortic duplex.   2. Essential hypertension Continue antihypertensive medications as already ordered, these medications have been reviewed and there are no changes at this time.   3. Diabetes mellitus due to underlying condition with diabetic nephropathy, unspecified whether long term insulin use ([place]) Continue hypoglycemic medications as already ordered, these medications have been reviewed and there are no changes at this time.  Hgb A1C to be monitored as already arranged by primary service   4. Smoking Smoking cessation was discussed, 3-10 minutes spent on this topic specifically continued smoking increases risk for aneurysm.   Current Outpatient Medications on File Prior to Visit  Medication Sig Dispense Refill   acetaminophen (TYLENOL) 500 MG tablet Take 500 mg by mouth every 6 (six) hours as needed.     albuterol (VENTOLIN HFA) 108 (90 Base) MCG/ACT inhaler Inhale 2 puffs into the lungs every 4 (four) hours as needed for wheezing or shortness of breath. 48 g 3   amLODipine (NORVASC) 5 MG tablet Take 1 tablet (5 mg total) by mouth daily. 90 tablet 3   aspirin (ASPIR-81) 81 MG EC tablet Take 81 mg by mouth daily.     clopidogrel (PLAVIX) 75 MG tablet Take 1 tablet (75 mg total) by mouth daily. 90 tablet 3   cyanocobalamin 1000 MCG tablet Take 1,000 mcg by mouth daily.     ezetimibe (ZETIA) 10 MG tablet Take 1 tablet (10 mg total) by mouth daily. 90 tablet 3   fluticasone furoate-vilanterol (BREO ELLIPTA) 100-25 MCG/ACT AEPB Inhale 1 puff into the lungs daily. 84 each 3   HYDROcodone-acetaminophen (NORCO/VICODIN) 5-325 MG tablet Take 1 tablet by mouth every 6 (six) hours as needed. 120 tablet 0   insulin glargine (LANTUS) 100 UNIT/ML injection Inject 22 Units into the skin daily.       lisinopril (ZESTRIL) 20 MG tablet Take 1 tablet (20 mg total) by mouth daily. 90 tablet 3   metoprolol tartrate (LOPRESSOR) 100 MG tablet Take 1 tablet (100 mg total) by mouth 2 (two) times daily. 180 tablet 3   Multiple Vitamins-Minerals (DAILY MULTI) TABS Take 1 tablet by mouth daily.     nitroGLYCERIN (NITROSTAT) 0.4 MG SL tablet Place 1 tablet (0.4 mg total) under the tongue every 5 (five) minutes as needed. 25 tablet 6   Omega-3 Fatty Acids (FISH OIL) 1000 MG CAPS Take 2 capsules by mouth in the morning and at bedtime.      ondansetron (ZOFRAN ODT) 4 MG disintegrating tablet Take 1 tablet (4 mg total) by mouth every 8 (eight) hours as needed for nausea or vomiting. 20 tablet 0   RABEprazole (ACIPHEX) 20 MG tablet Take 1 tablet (20 mg total) by mouth daily. 15 Mins. before evening meal 90 tablet 3   rosuvastatin (CRESTOR) 20 MG tablet Take 1 tablet (20 mg total) by mouth daily. 90 tablet 3   Semaglutide,0.25 or 0.5MG /DOS, 2 MG/1.5ML SOPN Inject 0.5 mg into the skin once a week.     sucralfate (CARAFATE) 1 g tablet Take 1 tablet (1 g total) by mouth 2 (two) times daily. 15 Minutes before evening meal and at bed time 180 tablet 3   tiotropium (SPIRIVA) 18 MCG inhalation capsule Place 1 capsule (18  mcg total) into inhaler and inhale daily. 90 capsule 4   No current facility-administered medications on file prior to visit.    There are no Patient Instructions on file for this visit. No follow-ups on file.   Kris Hartmann, NP

## 2021-02-21 ENCOUNTER — Encounter (INDEPENDENT_AMBULATORY_CARE_PROVIDER_SITE_OTHER): Payer: Self-pay | Admitting: Nurse Practitioner

## 2021-02-25 DIAGNOSIS — E1122 Type 2 diabetes mellitus with diabetic chronic kidney disease: Secondary | ICD-10-CM | POA: Diagnosis not present

## 2021-02-25 DIAGNOSIS — N2581 Secondary hyperparathyroidism of renal origin: Secondary | ICD-10-CM | POA: Diagnosis not present

## 2021-02-25 DIAGNOSIS — I129 Hypertensive chronic kidney disease with stage 1 through stage 4 chronic kidney disease, or unspecified chronic kidney disease: Secondary | ICD-10-CM | POA: Diagnosis not present

## 2021-02-25 DIAGNOSIS — R809 Proteinuria, unspecified: Secondary | ICD-10-CM | POA: Diagnosis not present

## 2021-02-25 DIAGNOSIS — N1832 Chronic kidney disease, stage 3b: Secondary | ICD-10-CM | POA: Diagnosis not present

## 2021-02-26 ENCOUNTER — Telehealth: Payer: Self-pay

## 2021-02-26 NOTE — Telephone Encounter (Signed)
°  Care Management   Follow Up Note   02/26/2021 Name: Hannah Kirby MRN: [id number] DOB: Jul 01, 1945   Primary Care Provider: Birdie Sons, MD Reason for referral : Chronic Care Management   An unsuccessful telephone outreach was attempted today. The patient was referred to the case management team for assistance with care management and care coordination.    Follow Up Plan:  A HIPAA compliant voice message was left today requesting a return call.   C[facility] Care Management [facility] [phone]

## 2021-03-08 ENCOUNTER — Ambulatory Visit (INDEPENDENT_AMBULATORY_CARE_PROVIDER_SITE_OTHER): Payer: Medicare Other

## 2021-03-08 DIAGNOSIS — Z1231 Encounter for screening mammogram for malignant neoplasm of breast: Secondary | ICD-10-CM | POA: Diagnosis not present

## 2021-03-08 DIAGNOSIS — Z0000 Encounter for general adult medical examination without abnormal findings: Secondary | ICD-10-CM | POA: Diagnosis not present

## 2021-03-08 NOTE — Patient Instructions (Signed)
Hannah Kirby , Thank you for taking time to come for your Medicare Wellness Visit. I appreciate your ongoing commitment to your health goals. Please review the following plan we discussed and let me know if I can assist you in the future.   Screening recommendations/referrals: Colonoscopy: 11/19/19 Mammogram: 12/29/16, referral sent Bone Density: 08/26/20 Recommended yearly ophthalmology/optometry visit for glaucoma screening and checkup Recommended yearly dental visit for hygiene and checkup  Vaccinations: Influenza vaccine: 11/25/20 Pneumococcal vaccine: 05/22/13 Tdap vaccine: n/d Shingles vaccine: Zostavax 04/23/12   Covid-19:04/02/19, 04/23/19, 01/02/20, 08/04/20  Advanced directives: no, sent info by mail  Conditions/risks identified: none  Next appointment: Follow up in one year for your annual wellness visit 03/09/22 @ 1pm by phone   Preventive Care 65 Years and Older, Female Preventive care refers to lifestyle choices and visits with your health care provider that can promote health and wellness. What does preventive care include? A yearly physical exam. This is also called an annual well check. Dental exams once or twice a year. Routine eye exams. Ask your health care provider how often you should have your eyes checked. Personal lifestyle choices, including: Daily care of your teeth and gums. Regular physical activity. Eating a healthy diet. Avoiding tobacco and drug use. Limiting alcohol use. Practicing safe sex. Taking low-dose aspirin every day. Taking vitamin and mineral supplements as recommended by your health care provider. What happens during an annual well check? The services and screenings done by your health care provider during your annual well check will depend on your age, overall health, lifestyle risk factors, and family history of disease. Counseling  Your health care provider may ask you questions about your: Alcohol use. Tobacco use. Drug use. Emotional  well-being. Home and relationship well-being. Sexual activity. Eating habits. History of falls. Memory and ability to understand (cognition). Work and work Statistician. Reproductive health. Screening  You may have the following tests or measurements: Height, weight, and BMI. Blood pressure. Lipid and cholesterol levels. These may be checked every 5 years, or more frequently if you are over 64 years old. Skin check. Lung cancer screening. You may have this screening every year starting at age 78 if you have a 30-pack-year history of smoking and currently smoke or have quit within the past 15 years. Fecal occult blood test (FOBT) of the stool. You may have this test every year starting at age 46. Flexible sigmoidoscopy or colonoscopy. You may have a sigmoidoscopy every 5 years or a colonoscopy every 10 years starting at age 81. Hepatitis C blood test. Hepatitis B blood test. Sexually transmitted disease (STD) testing. Diabetes screening. This is done by checking your blood sugar (glucose) after you have not eaten for a while (fasting). You may have this done every 1-3 years. Bone density scan. This is done to screen for osteoporosis. You may have this done starting at age 45. Mammogram. This may be done every 1-2 years. Talk to your health care provider about how often you should have regular mammograms. Talk with your health care provider about your test results, treatment options, and if necessary, the need for more tests. Vaccines  Your health care provider may recommend certain vaccines, such as: Influenza vaccine. This is recommended every year. Tetanus, diphtheria, and acellular pertussis (Tdap, Td) vaccine. You may need a Td booster every 10 years. Zoster vaccine. You may need this after age 46. Pneumococcal 13-valent conjugate (PCV13) vaccine. One dose is recommended after age 45. Pneumococcal polysaccharide (PPSV23) vaccine. One dose is recommended after  age 52. Talk to your  health care provider about which screenings and vaccines you need and how often you need them. This information is not intended to replace advice given to you by your health care provider. Make sure you discuss any questions you have with your health care provider. Document Released: 03/06/2015 Document Revised: 10/28/2015 Document Reviewed: 12/09/2014 Elsevier Interactive Patient Education  2017 [place] Prevention in the Home Falls can cause injuries. They can happen to people of all ages. There are many things you can do to make your home safe and to help prevent falls. What can I do on the outside of my home? Regularly fix the edges of walkways and driveways and fix any cracks. Remove anything that might make you trip as you walk through a door, such as a raised step or threshold. Trim any bushes or trees on the path to your home. Use bright outdoor lighting. Clear any walking paths of anything that might make someone trip, such as rocks or tools. Regularly check to see if handrails are loose or broken. Make sure that both sides of any steps have handrails. Any raised decks and porches should have guardrails on the edges. Have any leaves, snow, or ice cleared regularly. Use sand or salt on walking paths during winter. Clean up any spills in your garage right away. This includes oil or grease spills. What can I do in the bathroom? Use night lights. Install grab bars by the toilet and in the tub and shower. Do not use towel bars as grab bars. Use non-skid mats or decals in the tub or shower. If you need to sit down in the shower, use a plastic, non-slip stool. Keep the floor dry. Clean up any water that spills on the floor as soon as it happens. Remove soap buildup in the tub or shower regularly. Attach bath mats securely with double-sided non-slip rug tape. Do not have throw rugs and other things on the floor that can make you trip. What can I do in the bedroom? Use night  lights. Make sure that you have a light by your bed that is easy to reach. Do not use any sheets or blankets that are too big for your bed. They should not hang down onto the floor. Have a firm chair that has side arms. You can use this for support while you get dressed. Do not have throw rugs and other things on the floor that can make you trip. What can I do in the kitchen? Clean up any spills right away. Avoid walking on wet floors. Keep items that you use a lot in easy-to-reach places. If you need to reach something above you, use a strong step stool that has a grab bar. Keep electrical cords out of the way. Do not use floor polish or wax that makes floors slippery. If you must use wax, use non-skid floor wax. Do not have throw rugs and other things on the floor that can make you trip. What can I do with my stairs? Do not leave any items on the stairs. Make sure that there are handrails on both sides of the stairs and use them. Fix handrails that are broken or loose. Make sure that handrails are as long as the stairways. Check any carpeting to make sure that it is firmly attached to the stairs. Fix any carpet that is loose or worn. Avoid having throw rugs at the top or bottom of the stairs. If you do  have throw rugs, attach them to the floor with carpet tape. Make sure that you have a light switch at the top of the stairs and the bottom of the stairs. If you do not have them, ask someone to add them for you. What else can I do to help prevent falls? Wear shoes that: Do not have high heels. Have rubber bottoms. Are comfortable and fit you well. Are closed at the toe. Do not wear sandals. If you use a stepladder: Make sure that it is fully opened. Do not climb a closed stepladder. Make sure that both sides of the stepladder are locked into place. Ask someone to hold it for you, if possible. Clearly mark and make sure that you can see: Any grab bars or handrails. First and last  steps. Where the edge of each step is. Use tools that help you move around (mobility aids) if they are needed. These include: Canes. Walkers. Scooters. Crutches. Turn on the lights when you go into a dark area. Replace any light bulbs as soon as they burn out. Set up your furniture so you have a clear path. Avoid moving your furniture around. If any of your floors are uneven, fix them. If there are any pets around you, be aware of where they are. Review your medicines with your doctor. Some medicines can make you feel dizzy. This can increase your chance of falling. Ask your doctor what other things that you can do to help prevent falls. This information is not intended to replace advice given to you by your health care provider. Make sure you discuss any questions you have with your health care provider. Document Released: 12/04/2008 Document Revised: 07/16/2015 Document Reviewed: 03/14/2014 Elsevier Interactive Patient Education  2017 [employer].

## 2021-03-08 NOTE — Progress Notes (Signed)
Virtual Visit via Telephone Note  I connected with  Hannah Kirby on 03/08/21 at  1:00 PM EST by telephone and verified that I am speaking with the correct person using two identifiers.  Location: Patient: home Provider: [facility] Persons participating in the virtual visit: [place]   I discussed the limitations, risks, security and privacy concerns of performing an evaluation and management service by telephone and the availability of in person appointments. The patient expressed understanding and agreed to proceed.  Interactive audio and video telecommunications were attempted between this nurse and patient, however failed, due to patient having technical difficulties OR patient did not have access to video capability.  We continued and completed visit with audio only.  Some vital signs may be absent or patient reported.   Hannah David, LPN  Subjective:   Hannah Kirby is a 76 y.o. female who presents for Medicare Annual (Subsequent) preventive examination.  Review of Systems           Objective:    There were no vitals filed for this visit. There is no height or weight on file to calculate BMI.  Advanced Directives 03/24/2020 03/23/2020 02/18/2020 11/19/2019 08/07/2019 08/01/2019 04/24/2018  Does Patient Have a Medical Advance Directive? - No Yes No No No No  Type of Advance Directive - - [place];Living will - - - -  Copy of [place] in Chart? - - No - copy requested - - - -  Would patient like information on creating a medical advance directive? No - Patient declined - No - Patient declined - No - Patient declined - -    Current Medications (verified) Outpatient Encounter Medications as of 03/08/2021  Medication Sig   amLODipine (NORVASC) 10 MG tablet Take by mouth.   acetaminophen (TYLENOL) 500 MG tablet Take 500 mg by mouth every 6 (six) hours as needed.   albuterol (VENTOLIN HFA) 108 (90 Base) MCG/ACT inhaler  Inhale 2 puffs into the lungs every 4 (four) hours as needed for wheezing or shortness of breath.   amLODipine (NORVASC) 5 MG tablet Take 1 tablet (5 mg total) by mouth daily.   aspirin (ASPIR-81) 81 MG EC tablet Take 81 mg by mouth daily.   clopidogrel (PLAVIX) 75 MG tablet Take 1 tablet (75 mg total) by mouth daily.   cyanocobalamin 1000 MCG tablet Take 1,000 mcg by mouth daily.   ezetimibe (ZETIA) 10 MG tablet Take 1 tablet (10 mg total) by mouth daily.   fluticasone furoate-vilanterol (BREO ELLIPTA) 100-25 MCG/ACT AEPB Inhale 1 puff into the lungs daily.   HYDROcodone-acetaminophen (NORCO/VICODIN) 5-325 MG tablet Take 1 tablet by mouth every 6 (six) hours as needed.   insulin glargine (LANTUS) 100 UNIT/ML injection Inject 22 Units into the skin daily.    lisinopril (ZESTRIL) 20 MG tablet Take 1 tablet (20 mg total) by mouth daily.   metoprolol tartrate (LOPRESSOR) 100 MG tablet Take 1 tablet (100 mg total) by mouth 2 (two) times daily.   Multiple Vitamins-Minerals (DAILY MULTI) TABS Take 1 tablet by mouth daily.   nitroGLYCERIN (NITROSTAT) 0.4 MG SL tablet Place 1 tablet (0.4 mg total) under the tongue every 5 (five) minutes as needed.   Omega-3 Fatty Acids (FISH OIL) 1000 MG CAPS Take 2 capsules by mouth in the morning and at bedtime.    ondansetron (ZOFRAN ODT) 4 MG disintegrating tablet Take 1 tablet (4 mg total) by mouth every 8 (eight) hours as needed for nausea or  vomiting.   RABEprazole (ACIPHEX) 20 MG tablet Take 1 tablet (20 mg total) by mouth daily. 15 Mins. before evening meal   rosuvastatin (CRESTOR) 20 MG tablet Take 1 tablet (20 mg total) by mouth daily.   Semaglutide,0.25 or 0.5MG /DOS, 2 MG/1.5ML SOPN Inject 0.5 mg into the skin once a week.   sucralfate (CARAFATE) 1 g tablet Take 1 tablet (1 g total) by mouth 2 (two) times daily. 15 Minutes before evening meal and at bed time   tiotropium (SPIRIVA) 18 MCG inhalation capsule Place 1 capsule (18 mcg total) into inhaler and inhale  daily.   No facility-administered encounter medications on file as of 03/08/2021.    Allergies (verified) Spironolactone, Sulfonamide derivatives, Other, Sulfa antibiotics, Codeine, and Prednisone   History: Past Medical History:  Diagnosis Date   Anemia    Anxiety    Arthritis    C. difficile colitis 08/09/2019   CAD (coronary artery disease)    a. 02/2006 PCI: BMS x 2 to RCA, cath o/w without significant coronary disease; b. nuclear stress test 07/2014: No ischemia/infarct; c. 11/2017 MV: no isch/infarct, EF 55-65%; d. 03/2020 NSTEMI/PCI: LM nl, LAD min irregs, RI 25, small, LCX nl, RCA 30p/m ISR, 99d (3.0x15 Resolute Onyx DES).   Chronic bronchitis (HCC)    secondary to cigarette smoking   Chronic kidney disease (CKD), stage III (moderate) (HCC)    COPD (chronic obstructive pulmonary disease) (HCC)    Diabetes mellitus    Diastolic dysfunction    a. 07/2019 Echo: EF 60-65%, no rwma, mod LVH. Nl RV size/fxn; b. 03/2020 Echo: EF 60-65%, mod LVH, gr1 DD, nl RV size/fxn, mild AS.   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 03/2018 [facility]: Occas PVCs (2.5%). Triggered events assoc w/ PVC/PAC.   Retinal tear 2020   Smokers' cough ([place])    Spinal stenosis    Stroke ([place]) 10/29/2016   mild left side weakness   Stroke ([place]) 08/01/2019   Tobacco abuse    Past Surgical History:  Procedure Laterality Date   ABDOMINAL HYSTERECTOMY     BREAST SURGERY     CATARACT EXTRACTION W/PHACO Right 03/01/2017   Procedure: CATARACT EXTRACTION PHACO AND INTRAOCULAR LENS PLACEMENT ([place]) RIGHT DIABETIC;  Surgeon: Leandrew Koyanagi, MD;  Location: [place];  Service: Ophthalmology;  Laterality:  Right;   CATARACT EXTRACTION W/PHACO Left 03/22/2017   Procedure: CATARACT EXTRACTION PHACO AND INTRAOCULAR LENS PLACEMENT ([place]) LEFT DIABETIC;  Surgeon: Leandrew Koyanagi, MD;  Location: [place];  Service: Ophthalmology;  Laterality: Left;  Diabetic - insulin and oral meds   COLONOSCOPY WITH PROPOFOL N/A 09/23/2014   Procedure: COLONOSCOPY WITH PROPOFOL;  Surgeon: Lollie Sails, MD;  Location: [facility] ENDOSCOPY;  Service: Endoscopy;  Laterality: N/A;   COLONOSCOPY WITH PROPOFOL N/A 01/11/2018   Procedure: COLONOSCOPY WITH PROPOFOL;  Surgeon: Lollie Sails, MD;  Location: [facility] ENDOSCOPY;  Service: Endoscopy;  Laterality: N/A;   COLONOSCOPY WITH PROPOFOL N/A 04/24/2018   Procedure: COLONOSCOPY WITH PROPOFOL;  Surgeon: Lollie Sails, MD;  [facility] END[facility]COPY;  Service: Endoscopy;  Laterality: N/A;   COLONOSCOPY WITH PROPOFOL N/A 11/19/2019  Procedure: COLONOSCOPY WITH PROPOFOL;  Surgeon: Lucilla Lame, MD;  Location: [facility] ENDOSCOPY;  Service: Endoscopy;  Laterality: N/A;   CORONARY ANGIOPLASTY WITH STENT PLACEMENT  2008   CORONARY STENT INTERVENTION N/A 03/24/2020   Procedure: CORONARY STENT INTERVENTION;  Surgeon: Nelva Bush, MD;  Location: [place] CV LAB;  Service: Cardiovascular;  Laterality: N/A;   ESOPHAGOGASTRODUODENOSCOPY (EGD) WITH PROPOFOL N/A 12/30/2014   Procedure: ESOPHAGOGASTRODUODENOSCOPY (EGD) WITH PROPOFOL;  Surgeon: Lollie Sails, MD;  Location: [facility] END[facility]COPY;  Service: Endoscopy;  Laterality: N/A;   ESOPHAGOGASTRODUODENOSCOPY (EGD) WITH PROPOFOL N/A 07/19/2016   Procedure: ESOPHAGOGASTRODUODENOSCOPY (EGD) WITH PROPOFOL;  Surgeon: Lollie Sails, MD;  Location[facility] ENDOSCOPY;  Service: Endoscopy;  Laterality: N/A;   ESOPHAGOGASTRODUODENOSCOPY (EGD) WITH PROPOFOL N/A 04/24/2018   Procedure: ESOPHAGOGASTRODUODENOSCOPY (EGD) WITH PROPOFOL;  Surgeon: Lollie Sails, MD;  Location: [facility] ENDOSCOPY;  Service: Endoscopy;  Laterality: N/A;    hysterectomy (other)     LEFT HEART CATH AND CORONARY ANGIOGRAPHY N/A 03/24/2020   Procedure: LEFT HEART CATH AND CORONARY ANGIOGRAPHY;  Surgeon: Nelva Bush, MD;  Location: [place] CV LAB;  Service: Cardiovascular;  Laterality: N/A;   Family History  Problem Relation Age of Onset   Heart failure Mother    Stroke Mother    Diabetes Mother    Congestive Heart Failure Mother    Lung cancer Father    Breast cancer Maternal Aunt    Social History   Socioeconomic History   Marital status: Married    Spouse name: Chelsee Hosie   Number of children: 3   Years of education: Not on file   Highest education level: 12th grade  Occupational History   Occupation: retired  Tobacco Use   Smoking status: Every Day    Packs/day: 1.00    Years: 40.00    Pack years: 40.00    Types: Cigarettes   Smokeless tobacco: Never  Vaping Use   Vaping Use: Former  Substance and Sexual Activity   Alcohol use: Yes    Comment: 1 glass of wine every few months on special occassions   Drug use: No   Sexual activity: Not on file  Other Topics Concern   Not on file  Social History Narrative   Retired. Married.  Lives in [place] with her husband.  She is not routinely exercising.   Social Determinants of Health   Financial Resource Strain: Not on file  Food Insecurity: No Food Insecurity   Worried About Charity fundraiser in the Last Year: Never true   Ran Out of Food in the Last Year: Never true  Transportation Needs: No Transportation Needs   Lack of Transportation (Medical): No   Lack of Transportation (Non-Medical): No  Physical Activity: Not on file  Stress: Not on file  Social Connections: Not on file    Tobacco Counseling Ready to quit: Not Answered Counseling given: Not Answered   Clinical Intake:  Pre-visit preparation completed: Yes  Pain : No/denies pain     Nutritional Risks: None Diabetes: Yes CBG done?: No Did pt. bring in CBG monitor from home?:  No  How often do you need to have someone help you when you read instructions, pamphlets, or other written materials from your doctor or pharmacy?: 1 - Never  Diabetic?yes Nutrition Risk Assessment:  Has the patient had any N/V/D within the last 2 months?  Yes  Does the patient have any non-healing wounds?  No  Has the patient had any unintentional weight loss or weight gain?  No   Diabetes:  Is the patient diabetic?  Yes  If diabetic, was a CBG obtained today?  No  Did the patient bring in their glucometer from home?  No  How often do you monitor your CBG's? Not often recently, usually once per day.   Financial Strains and Diabetes Management:  Are you having any financial strains with the device, your supplies or your medication? No .  Does the patient want to be seen by Chronic Care Management for management of their diabetes?  No  Would the patient like to be referred to a Nutritionist or for Diabetic Management?  No   Diabetic Exams:  Diabetic Eye Exam: Completed 07/13/20.  Diabetic Foot Exam: Completed 9/28/217. Pt has been advised about the importance in completing this exam.   Interpreter Needed?: No  Information entered by :: Kirke Shaggy, LPN   Activities of Daily Living In your present state of health, do you have any difficulty performing the following activities: 03/04/2021 02/14/2021  Hearing? N N  Vision? N N  Difficulty concentrating or making decisions? N N  Walking or climbing stairs? Y Y  Dressing or bathing? N N  Doing errands, shopping? N N  Preparing Food and eating ? N N  Using the Toilet? N N  In the past six months, have you accidently leaked urine? N N  Do you have problems with loss of bowel control? N N  Managing your Medications? N N  Managing your Finances? N N  Housekeeping or managing your Housekeeping? N N  Some recent data might be hidden    Patient Care Team: Birdie Sons, MD as PCP - General (Family Medicine) [name] Kathlene November, MD as PCP - Cardiology (Cardiology) Minna Merritts, MD as Consulting Physician (Cardiology) Dingeldein, Remo Lipps, MD as Consulting Physician (Ophthalmology) Lavonia Dana, MD as Consulting Physician (Nephrology) Lucky Cowboy Erskine Squibb, MD as Referring Physician (Vascular Surgery) Lonia Farber, MD as Consulting Physician (Endocrinology) Lucilla Lame, MD as Consulting Physician (Gastroenterology) Neldon Labella, RN as Case Manager  Indicate any recent Medical Services you may have received from other than [facility] providers in the past year (date may be approximate).     Assessment:   This is a routine wellness examination for [place].  Hearing/Vision screen No results found.  Dietary issues and exercise activities discussed:     Goals Addressed   None    Depression Screen PHQ 2/9 Scores 04/01/2020 02/18/2020 11/20/2019 08/16/2019 08/04/2017 08/03/2016 08/03/2016  PHQ - 2 Score 0 0 1 1 0 0 0  PHQ- 9 Score - - 8 - - 0 -    Fall Risk Fall Risk  03/04/2021 02/14/2021 02/14/2021 08/04/2020 06/05/2020  Falls in the past year? 0 1 1 - 1  Number falls in past yr: 0 0 0 - 1  Comment - - - - Reports fall earlier this week  Injury with Fall? 0 1 1 0 -  Risk for fall due to : - - - - History of fall(s);Medication side effect  Follow up - - - - Falls prevention discussed    FALL RISK PREVENTION PERTAINING TO THE HOME:  Any stairs in or around the home? No  If so, are there any without handrails? No  Home free of loose throw rugs in walkways, pet beds, electrical cords, etc? Yes  Adequate lighting in your home to reduce risk of falls? Yes   ASSISTIVE DEVICES UTILIZED TO PREVENT FALLS:  Life alert? No  Use of a  cane, walker or w/c? No  Grab bars in the bathroom? Yes  Shower chair or bench in shower? Yes  Elevated toilet seat or a handicapped toilet? Yes     Cognitive Function:Normal cognitive status assessed by direct observation by this Nurse Health Advisor. No abnormalities found.        6CIT Screen 08/03/2016  What Year? 0 points  What month? 0 points  What time? 0 points  Count back from 20 0 points  Months in reverse 0 points  Repeat phrase 0 points  Total Score 0    Immunizations Immunization History  Administered Date(s) Administered   Fluad Quad(high Dose 65+) 11/20/2019, 11/25/2020   Influenza, High Dose Seasonal PF 11/01/2014, 11/24/2015, 11/08/2016, 11/22/2017   Influenza-Unspecified 10/22/2013   PFIZER Comirnaty(Gray Top)Covid-19 Tri-Sucrose Vaccine 08/04/2020   PFIZER(Purple Top)SARS-COV-2 Vaccination 04/02/2019, 04/23/2019, 01/02/2020, 08/04/2020   Pneumococcal Conjugate-13 05/22/2013   Pneumococcal Polysaccharide-23 11/02/2011   Zoster, Live 04/23/2012    TDAP status: Due, Education has been provided regarding the importance of this vaccine. Advised may receive this vaccine at local pharmacy or Health Dept. Aware to provide a copy of the vaccination record if obtained from local pharmacy or Health Dept. Verbalized acceptance and understanding.  Flu Vaccine status: Up to date  Pneumococcal vaccine status: Up to date  Covid-19 vaccine status: Completed vaccines  Qualifies for Shingles Vaccine? Yes   Zostavax completed Yes   Shingrix Completed?: No.    Education has been provided regarding the importance of this vaccine. Patient has been advised to call insurance company to determine out of pocket expense if they have not yet received this vaccine. Advised may also receive vaccine at local pharmacy or Health Dept. Verbalized acceptance and understanding.  Screening Tests Health Maintenance  Topic Date Due   Zoster Vaccines- Shingrix (1 of 2) Never done   FOOT EXAM  11/18/2016   HEMOGLOBIN A1C  05/19/2020   COVID-19 Vaccine (6 - Booster for Pfizer series) 09/29/2020   TETANUS/TDAP  02/21/2026 (Originally 07/12/1964)   OPHTHALMOLOGY EXAM  07/13/2021   DEXA SCAN  08/27/2022   COLONOSCOPY (Pts 45-62yrs Insurance coverage will need to be  confirmed)  11/18/2024   Pneumonia Vaccine 39+ Years old  Completed   INFLUENZA VACCINE  Completed   Hepatitis C Screening  Completed   HPV VACCINES  Aged Out   MAMMOGRAM  Discontinued    Health Maintenance  Health Maintenance Due  Topic Date Due   Zoster Vaccines- Shingrix (1 of 2) Never done   FOOT EXAM  11/18/2016   HEMOGLOBIN A1C  05/19/2020   COVID-19 Vaccine (6 - Booster for [place] series) 09/29/2020    Colorectal cancer screening: Type of screening: Colonoscopy. Completed 11/19/19. Repeat every 10 years  Mammogram status: Completed 12/29/16. Repeat every year  Bone Density status: Completed 08/26/20. Results reflect: Bone density results: NORMAL. Repeat every 5 years.  Lung Cancer Screening: (Low Dose CT Chest recommended if Age 46-80 years, 30 pack-year currently smoking OR have quit w/in 15years.) does not qualify.    Additional Screening:  Hepatitis C Screening: does qualify; Completed 07/27/15           Vision Screening: Recommended annual ophthalmology exams for early detection of glaucoma and other disorders of the eye. Is the patient up to date with their annual eye exam?  Yes  Who is the provider or what is the name of the office in which the patient attends annual eye exams? [facility] If pt is not established with a  provider, would they like to be referred to a provider to establish care? No .   Dental Screening: Recommended annual dental exams for proper oral hygiene  Community Resource Referral / Chronic Care Management: CRR required this visit?  No   CCM required this visit?  No      Plan:     I have personally reviewed and noted the following in the patients chart:   Medical and social history Use of alcohol, tobacco or illicit drugs  Current medications and supplements including opioid prescriptions.  Functional ability and status Nutritional status Physical activity Advanced directives List of other physicians Hospitalizations,  surgeries, and ER visits in previous 12 months Vitals Screenings to include cognitive, depression, and falls Referrals and appointments  In addition, I have reviewed and discussed with patient certain preventive protocols, quality metrics, and best practice recommendations. A written personalized care plan for preventive services as well as general preventive health recommendations were provided to patient.     Hannah David, LPN   [id number]   Nurse Notes: none

## 2021-03-12 ENCOUNTER — Encounter: Payer: Self-pay | Admitting: Family Medicine

## 2021-03-12 DIAGNOSIS — M48061 Spinal stenosis, lumbar region without neurogenic claudication: Secondary | ICD-10-CM

## 2021-03-12 DIAGNOSIS — F1190 Opioid use, unspecified, uncomplicated: Secondary | ICD-10-CM

## 2021-03-12 MED ORDER — HYDROCODONE-ACETAMINOPHEN 5-325 MG PO TABS: 1.0000 | ORAL_TABLET | Freq: Four times a day (QID) | ORAL | 0 refills | Status: DC | PRN

## 2021-03-12 NOTE — Telephone Encounter (Signed)
LOV: 08/04/2020  NOV: 04/12/2021  Last Refill: 02/09/2021  #120 0 Refills   Thanks,   -Mickel Baas

## 2021-03-16 ENCOUNTER — Other Ambulatory Visit: Payer: Self-pay

## 2021-03-16 MED ORDER — SUCRALFATE 1 G PO TABS: 1.0000 g | ORAL_TABLET | Freq: Two times a day (BID) | ORAL | 0 refills | Status: DC

## 2021-03-16 MED ORDER — RABEPRAZOLE SODIUM 20 MG PO TBEC: 20.0000 mg | DELAYED_RELEASE_TABLET | Freq: Every day | ORAL | 0 refills | Status: DC

## 2021-03-16 NOTE — Telephone Encounter (Signed)
Left message on voicemail requesting pt to return call... Pt will need to schedule a f/u visit as she has not been seen since 10/2019... 1 refill sent of requested Rx to pharmacy.Marland KitchenMarland Kitchen

## 2021-03-18 ENCOUNTER — Ambulatory Visit (INDEPENDENT_AMBULATORY_CARE_PROVIDER_SITE_OTHER): Payer: Medicare Other

## 2021-03-18 DIAGNOSIS — J449 Chronic obstructive pulmonary disease, unspecified: Secondary | ICD-10-CM

## 2021-03-18 DIAGNOSIS — Z794 Long term (current) use of insulin: Secondary | ICD-10-CM

## 2021-03-18 DIAGNOSIS — E1121 Type 2 diabetes mellitus with diabetic nephropathy: Secondary | ICD-10-CM

## 2021-03-18 NOTE — Patient Instructions (Signed)
Thank you for allowing the Chronic Care Management team to participate in your care. It was great speaking with you today! °

## 2021-03-18 NOTE — Chronic Care Management (AMB) (Signed)
Chronic Care Management   CCM RN Visit Note  03/18/2021 Name: Hannah Kirby MRN: [id number] DOB: 11/17/1945  Subjective: Hannah Kirby is a 76 y.o. year old female who is a primary care patient of Fisher, Kirstie Peri, MD. The care management team was consulted for assistance with disease management and care coordination needs.    Engaged with patient by telephone for follow up visit in response to provider referral for case management and care coordination services.   Consent to Services:  The patient was given information about Chronic Care Management services, agreed to services, and gave verbal consent prior to initiation of services.  Please see initial visit note for detailed documentation.   Assessment: Review of patient past medical history, allergies, medications, health status, including review of consultants reports, laboratory and other test data, was performed as part of comprehensive evaluation and provision of chronic care management services.   SDOH (Social Determinants of Health) assessments and interventions performed: No  CCM Care Plan  Allergies  Allergen Reactions   Spironolactone Shortness Of Breath   Sulfonamide Derivatives Other (See Comments)    Last taken as a child; made her mouth break out   Other Other (See Comments)    Mouth blisters   Sulfa Antibiotics Other (See Comments)    Mouth blisters Welts on mouth    Codeine Nausea And Vomiting and Other (See Comments)    Nausea   Prednisone Palpitations and Other (See Comments)    Made her legs "feel weird."    Outpatient Encounter Medications as of 03/18/2021  Medication Sig   acetaminophen (TYLENOL) 500 MG tablet Take 500 mg by mouth every 6 (six) hours as needed.   albuterol (VENTOLIN HFA) 108 (90 Base) MCG/ACT inhaler Inhale 2 puffs into the lungs every 4 (four) hours as needed for wheezing or shortness of breath.   amLODipine (NORVASC) 10 MG tablet Take by mouth.   amLODipine (NORVASC) 5 MG  tablet Take 1 tablet (5 mg total) by mouth daily.   aspirin (ASPIR-81) 81 MG EC tablet Take 81 mg by mouth daily.   clopidogrel (PLAVIX) 75 MG tablet Take 1 tablet (75 mg total) by mouth daily.   cyanocobalamin 1000 MCG tablet Take 1,000 mcg by mouth daily.   ezetimibe (ZETIA) 10 MG tablet Take 1 tablet (10 mg total) by mouth daily.   fluticasone furoate-vilanterol (BREO ELLIPTA) 100-25 MCG/ACT AEPB Inhale 1 puff into the lungs daily.   HYDROcodone-acetaminophen (NORCO/VICODIN) 5-325 MG tablet Take 1 tablet by mouth every 6 (six) hours as needed.   insulin glargine (LANTUS) 100 UNIT/ML injection Inject 22 Units into the skin daily.    lisinopril (ZESTRIL) 20 MG tablet Take 1 tablet (20 mg total) by mouth daily.   metoprolol tartrate (LOPRESSOR) 100 MG tablet Take 1 tablet (100 mg total) by mouth 2 (two) times daily.   Multiple Vitamins-Minerals (DAILY MULTI) TABS Take 1 tablet by mouth daily.   nitroGLYCERIN (NITROSTAT) 0.4 MG SL tablet Place 1 tablet (0.4 mg total) under the tongue every 5 (five) minutes as needed.   Omega-3 Fatty Acids (FISH OIL) 1000 MG CAPS Take 2 capsules by mouth in the morning and at bedtime.    ondansetron (ZOFRAN ODT) 4 MG disintegrating tablet Take 1 tablet (4 mg total) by mouth every 8 (eight) hours as needed for nausea or vomiting.   RABEprazole (ACIPHEX) 20 MG tablet Take 1 tablet (20 mg total) by mouth daily. 15 Mins. before evening meal   rosuvastatin (CRESTOR) 20 MG  tablet Take 1 tablet (20 mg total) by mouth daily.   Semaglutide,0.25 or 0.5MG/DOS, 2 MG/1.5ML SOPN Inject 0.5 mg into the skin once a week.   sucralfate (CARAFATE) 1 g tablet Take 1 tablet (1 g total) by mouth 2 (two) times daily. 15 Minutes before evening meal and at bed time   tiotropium (SPIRIVA) 18 MCG inhalation capsule Place 1 capsule (18 mcg total) into inhaler and inhale daily.   No facility-administered encounter medications on file as of 03/18/2021.    Patient Active Problem List    Diagnosis Date Noted   Restrictive airway disease 07/21/2020   Partial thickness rotator cuff tear 03/20/2020   Shoulder pain 03/05/2020   Retinal detachment, left 03/04/2020   Ataxia 02/28/2020   Difficulty walking 02/28/2020   Physical deconditioning 02/28/2020   Chronic diastolic congestive heart failure ([place]) 11/20/2019   Polyp of ascending colon    Acute hemorrhagic colitis 08/08/2019   Type II diabetes mellitus with renal manifestations ([place]) 08/08/2019   Hypokalemia 08/08/2019   TIA (transient ischemic attack) 08/01/2019   Benign hypertensive kidney disease with chronic kidney disease 04/25/2019   Type 2 diabetes mellitus with diabetic chronic kidney disease ([place]) 04/25/2019   Secondary hyperparathyroidism of renal origin ([place]) 10/24/2018   Chronic, continuous use of opioids 03/26/2018   History of adenomatous polyp of colon 01/22/2018   Ulceration of colon determined by endoscopy 01/22/2018   Diverticulosis of colon 01/22/2018   History of CVA (cerebrovascular accident) 11/08/2016   Weakness of left upper extremity 10/30/2016   AAA (abdominal aortic aneurysm) without rupture 08/12/2016   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   Stage 3b chronic kidney disease ([place]) 07/27/2015   Chest pain at rest 05/06/2015   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[place]) 10/28/2014   Solitary pulmonary nodule on lung CT 08/20/2014   Allergic rhinitis 07/28/2014   CAFL (chronic airflow limitation) (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  RNCM Clinical Goal(s):  Patient will demonstrate Ongoing adherence to prescribed treatment plan for  Chronic Airflow Limitation, HTN, HLD, and DM through collaboration with the provider,  RN Care Manager and the care team.  Interventions: 1:1 collaboration with primary care provider regarding development and update of comprehensive plan of care as evidenced by provider attestation and co-signature Inter-disciplinary care team collaboration (see longitudinal plan of care) Evaluation of current treatment plan related to  self management and patient's adherence to plan as established by provider   Diabetes Interventions:   Discussed established A1c goal: <7% Lab Results  Component Value Date   HGBA1C 8.2 (A) 11/20/2019  Reviewed plan for diabetes management. Reports taking medications as prescribed and monitoring blood glucose levels as  advised.  Reviewed blood glucose readings. Reviewed s/sx of hypoglycemia and hyperglycemia. Reports fasting levels have been within range with readings in the 90's. She recalls experiencing episodes of nausea that usually occurs the day after administering Ozempic. Reports episodes are well controlled with Zofran. Reviewed s/sx of hypoglycemia and hyperglycemia along with recommended interventions. Reviewed nutritional intake. She is attempting to adhere to the recommended ADA/modified carb diet.  Discussed pending Endocrinology. Advised to attend as scheduled. Denies concerns regarding transportation.   Hyperlipidemia Interventions:  Lab Results  Component Value Date   CHOL 173 08/04/2020   HDL 48  08/04/2020   LDLCALC 82 08/04/2020   TRIG 262 (H) 08/04/2020   CHOLHDL 3.6 08/04/2020  Medications reviewed. Reports taking medications as prescribed and tolerating regimen Reviewed provider established cholesterol goals Discussed importance of regular laboratory monitoring  Reviewed importance of limiting foods high in cholesterol  Hypertension Interventions:   Last practice recorded BP readings:  BP Readings from Last 3 Encounters:  02/10/21 (!) 143/81  02/02/21 112/72  08/05/20 (!) 144/96  Most recent eGFR/CrCl:  Lab Results  Component Value Date   EGFR 44 (L) 08/04/2020    No components found for: CRCL  Reviewed plan for hypertension management. Reports taking all medications as prescribed. Advised to notify a provider if unable to tolerate prescribed regimen.   Reviewed established blood pressure parameters along with indications for notifying a provider. Advised to monitor and record readings. Denies episodes of chest discomfort, palpitations, lightheadedness, or dizziness. Reviewed s/sx of heart attack, stroke and worsening symptoms that require immediate medical attention.  Chronic Airflow Limitation Interventions: Reviewed current treatment plan. Reports taking medications and using inhaler as prescribed. She reports decreased activity tolerance. This has been a chronic issue. Reports being able to perform ADL's and task in the home independently but requires frequent breaks d/t tiring easily. Reports a few episodes of shortness of breath that were quickly relieved after using her inhaler. Reports an unproductive cough and increase sinus drainage. She verbalized knowledge of indications for seeking medical attention.  Reviewed importance of daily self assessment and need to be evaluated if experiencing moderate symptoms for greater than 48 hours without improvement.  Thoroughly reviewed need to maintain distance from sick contacts to reduce risk of respiratory infections.     Patient Goals/Self-Care Activities: Take all medications as prescribed Attend all scheduled provider appointments Call pharmacy for medication refills 3-7 days in advance of running out of medications Perform all self care activities independently  Call provider office for new concerns or questions    Follow Up Plan:   Will follow up next month      PLAN: A member of the care management team will follow up next month.   [facility] Care Management [facility] [phone]

## 2021-03-19 DIAGNOSIS — E1121 Type 2 diabetes mellitus with diabetic nephropathy: Secondary | ICD-10-CM | POA: Diagnosis not present

## 2021-03-19 DIAGNOSIS — E1169 Type 2 diabetes mellitus with other specified complication: Secondary | ICD-10-CM | POA: Diagnosis not present

## 2021-03-19 DIAGNOSIS — I152 Hypertension secondary to endocrine disorders: Secondary | ICD-10-CM | POA: Diagnosis not present

## 2021-03-19 DIAGNOSIS — E1159 Type 2 diabetes mellitus with other circulatory complications: Secondary | ICD-10-CM | POA: Diagnosis not present

## 2021-03-19 DIAGNOSIS — Z794 Long term (current) use of insulin: Secondary | ICD-10-CM | POA: Diagnosis not present

## 2021-03-19 DIAGNOSIS — E785 Hyperlipidemia, unspecified: Secondary | ICD-10-CM | POA: Diagnosis not present

## 2021-03-19 LAB — HEMOGLOBIN A1C: Hemoglobin A1C: 6.5

## 2021-03-23 DIAGNOSIS — I10 Essential (primary) hypertension: Secondary | ICD-10-CM | POA: Diagnosis not present

## 2021-03-23 DIAGNOSIS — Z794 Long term (current) use of insulin: Secondary | ICD-10-CM

## 2021-03-23 DIAGNOSIS — E1159 Type 2 diabetes mellitus with other circulatory complications: Secondary | ICD-10-CM | POA: Diagnosis not present

## 2021-03-23 DIAGNOSIS — F17210 Nicotine dependence, cigarettes, uncomplicated: Secondary | ICD-10-CM | POA: Diagnosis not present

## 2021-03-23 DIAGNOSIS — J449 Chronic obstructive pulmonary disease, unspecified: Secondary | ICD-10-CM

## 2021-03-24 ENCOUNTER — Encounter: Payer: Self-pay | Admitting: Cardiovascular Disease

## 2021-04-02 DIAGNOSIS — Z20822 Contact with and (suspected) exposure to covid-19: Secondary | ICD-10-CM | POA: Diagnosis not present

## 2021-04-06 DIAGNOSIS — M75101 Unspecified rotator cuff tear or rupture of right shoulder, not specified as traumatic: Secondary | ICD-10-CM | POA: Diagnosis not present

## 2021-04-06 DIAGNOSIS — M19111 Post-traumatic osteoarthritis, right shoulder: Secondary | ICD-10-CM | POA: Diagnosis not present

## 2021-04-12 ENCOUNTER — Ambulatory Visit (INDEPENDENT_AMBULATORY_CARE_PROVIDER_SITE_OTHER): Payer: Medicare Other | Admitting: Family Medicine

## 2021-04-12 ENCOUNTER — Encounter: Payer: Self-pay | Admitting: Family Medicine

## 2021-04-12 ENCOUNTER — Other Ambulatory Visit: Payer: Self-pay

## 2021-04-12 VITALS — BP 128/70 | HR 71 | Temp 98.2°F | Resp 14 | Wt 163.0 lb

## 2021-04-12 DIAGNOSIS — J449 Chronic obstructive pulmonary disease, unspecified: Secondary | ICD-10-CM

## 2021-04-12 DIAGNOSIS — M48061 Spinal stenosis, lumbar region without neurogenic claudication: Secondary | ICD-10-CM | POA: Diagnosis not present

## 2021-04-12 DIAGNOSIS — R202 Paresthesia of skin: Secondary | ICD-10-CM

## 2021-04-12 DIAGNOSIS — N1832 Chronic kidney disease, stage 3b: Secondary | ICD-10-CM

## 2021-04-12 DIAGNOSIS — R5383 Other fatigue: Secondary | ICD-10-CM | POA: Diagnosis not present

## 2021-04-12 DIAGNOSIS — R0609 Other forms of dyspnea: Secondary | ICD-10-CM | POA: Diagnosis not present

## 2021-04-12 DIAGNOSIS — R0902 Hypoxemia: Secondary | ICD-10-CM | POA: Diagnosis not present

## 2021-04-12 DIAGNOSIS — R200 Anesthesia of skin: Secondary | ICD-10-CM

## 2021-04-12 MED ORDER — HYDROCODONE-ACETAMINOPHEN 5-325 MG PO TABS: 1.0000 | ORAL_TABLET | Freq: Four times a day (QID) | ORAL | 0 refills | Status: DC | PRN

## 2021-04-12 NOTE — Progress Notes (Addendum)
I,[name] L Chambers,acting as a scribe for Lelon Huh, MD.,have documented all relevant documentation on the behalf of Lelon Huh, MD,as directed by  Lelon Huh, MD while in the presence of Lelon Huh, MD.    Established patient visit   Patient: Hannah Kirby   DOB: [birth year]   76 y.o. Female  MRN: [id number] Visit Date: 04/12/2021  Today's healthcare provider: Lelon Huh, MD   Chief Complaint  Patient presents with   Fatigue   Subjective    HPI  Fatigue: Patient reports having severe fatigue for the past 3 months. She feels this problem is worsened since onset. Patient gives out of energy and is short of breath when performing ADL. She also reports tingling in her right hand and tongue for the past 2 weeks. Associated symptoms include lightheadedness. Shortness of breath with exertion. Feeling sleepy. No chest pains, nausea, vomiting.   Medications: Outpatient Medications Prior to Visit  Medication Sig   acetaminophen (TYLENOL) 500 MG tablet Take 500 mg by mouth every 6 (six) hours as needed.   albuterol (VENTOLIN HFA) 108 (90 Base) MCG/ACT inhaler Inhale 2 puffs into the lungs every 4 (four) hours as needed for wheezing or shortness of breath.   amLODipine (NORVASC) 10 MG tablet Take by mouth.   amLODipine (NORVASC) 5 MG tablet Take 1 tablet (5 mg total) by mouth daily.   aspirin (ASPIR-81) 81 MG EC tablet Take 81 mg by mouth daily.   clopidogrel (PLAVIX) 75 MG tablet Take 1 tablet (75 mg total) by mouth daily.   cyanocobalamin 1000 MCG tablet Take 1,000 mcg by mouth daily.   ezetimibe (ZETIA) 10 MG tablet Take 1 tablet (10 mg total) by mouth daily.   fluticasone furoate-vilanterol (BREO ELLIPTA) 100-25 MCG/ACT AEPB Inhale 1 puff into the lungs daily.   insulin glargine (LANTUS) 100 UNIT/ML injection Inject 22 Units into the skin daily.    lisinopril (ZESTRIL) 20 MG tablet Take 1 tablet (20 mg total) by mouth daily.   metoprolol tartrate (LOPRESSOR) 100  MG tablet Take 1 tablet (100 mg total) by mouth 2 (two) times daily.   Multiple Vitamins-Minerals (DAILY MULTI) TABS Take 1 tablet by mouth daily.   nitroGLYCERIN (NITROSTAT) 0.4 MG SL tablet Place 1 tablet (0.4 mg total) under the tongue every 5 (five) minutes as needed.   Omega-3 Fatty Acids (FISH OIL) 1000 MG CAPS Take 2 capsules by mouth in the morning and at bedtime.    ondansetron (ZOFRAN ODT) 4 MG disintegrating tablet Take 1 tablet (4 mg total) by mouth every 8 (eight) hours as needed for nausea or vomiting.   RABEprazole (ACIPHEX) 20 MG tablet Take 1 tablet (20 mg total) by mouth daily. 15 Mins. before evening meal   rosuvastatin (CRESTOR) 20 MG tablet Take 1 tablet (20 mg total) by mouth daily.   Semaglutide,0.25 or 0.5MG /DOS, 2 MG/1.5ML SOPN Inject 0.5 mg into the skin once a week.   sucralfate (CARAFATE) 1 g tablet Take 1 tablet (1 g total) by mouth 2 (two) times daily. 15 Minutes before evening meal and at bed time   tiotropium (SPIRIVA) 18 MCG inhalation capsule Place 1 capsule (18 mcg total) into inhaler and inhale daily.   [DISCONTINUED] HYDROcodone-acetaminophen (NORCO/VICODIN) 5-325 MG tablet Take 1 tablet by mouth every 6 (six) hours as needed.   No facility-administered medications prior to visit.    Review of Systems  Constitutional:  Positive for fatigue. Negative for appetite change, chills and fever.  Respiratory:  Positive  for shortness of breath. Negative for chest tightness.   Cardiovascular:  Negative for chest pain and palpitations.  Gastrointestinal:  Negative for abdominal pain, nausea and vomiting.  Neurological:  Positive for weakness and light-headedness. Negative for dizziness.       Tingling in hand and mouth      Objective    BP 128/70 (BP Location: Left Arm, Patient Position: Sitting, Cuff Size: Normal)    Pulse 71    Temp 98.2 F (36.8 C) (Oral)    Resp 14    Wt 163 lb (73.9 kg)    SpO2 (!) 85% Comment: walking; room air   BMI 26.31 kg/m   Today's  Vitals   04/12/21 0852 04/12/21 0930  BP: 128/70   Pulse: 71   Resp: 14   Temp: 98.2 F (36.8 C)   TempSrc: Oral   SpO2: 97% sitting on room air (!) 85%  walking on room air  Weight: 163 lb (73.9 kg)    Body mass index is 26.31 kg/m.   O2 on 2lpm O2 while walking increased to 98%  Physical Exam   General: Appearance:     Well developed, well nourished female in no acute distress  Eyes:    PERRL, conjunctiva/corneas clear, EOM's intact       Lungs:     Clear to auscultation bilaterally, respirations unlabored  Heart:    Normal heart rate. Normal rhythm. No murmurs, rubs, or gallops.    MS:   All extremities are intact.    Neurologic:   Awake, alert, oriented x 3. No apparent focal neurological defect.         Assessment & Plan     1. Numbness and tingling  - B12  2. Other fatigue  - CBC - TSH  3. Dyspnea on exertion  - B Nat Peptide  4. CAFL (chronic airflow limitation) (HCC)   5. Hypoxia Set up for home ambulatory oxygen and overnight oximetry.   6. Spinal stenosis of lumbar region, unspecified whether neurogenic claudication present  - HYDROcodone-acetaminophen (NORCO/VICODIN) 5-325 MG tablet; Take 1 tablet by mouth every 6 (six) hours as needed.  Dispense: 120 tablet; Refill: 0  7. Stage 3b chronic kidney disease (HCC)  - VITAMIN D 25 Hydroxy (Vit-D Deficiency, Fractures)      The entirety of the information documented in the History of Present Illness, Review of Systems and Physical Exam were personally obtained by me. Portions of this information were initially documented by the CMA and reviewed by me for thoroughness and accuracy.     Lelon Huh, MD  [facility] [phone] (phone) [phone] (fax)  [place]

## 2021-04-13 LAB — CBC
Hematocrit: 46.8 % — ABNORMAL HIGH (ref 34.0–46.6)
Hemoglobin: 15.9 g/dL (ref 11.1–15.9)
MCH: 32.3 pg (ref 26.6–33.0)
MCHC: 34 g/dL (ref 31.5–35.7)
MCV: 95 fL (ref 79–97)
Platelets: 223 10*3/uL (ref 150–450)
RBC: 4.92 x10E6/uL (ref 3.77–5.28)
RDW: 12.1 % (ref 11.7–15.4)
WBC: 9.2 10*3/uL (ref 3.4–10.8)

## 2021-04-13 LAB — VITAMIN D 25 HYDROXY (VIT D DEFICIENCY, FRACTURES): Vit D, 25-Hydroxy: 43.6 ng/mL (ref 30.0–100.0)

## 2021-04-13 LAB — TSH: TSH: 2.91 u[IU]/mL (ref 0.450–4.500)

## 2021-04-13 LAB — VITAMIN B12: Vitamin B-12: >2000 pg/mL — ABNORMAL HIGH (ref 232–1245)

## 2021-04-13 LAB — BRAIN NATRIURETIC PEPTIDE: BNP: 68.2 pg/mL (ref 0.0–100.0)

## 2021-04-14 ENCOUNTER — Telehealth: Payer: Self-pay

## 2021-04-14 DIAGNOSIS — J449 Chronic obstructive pulmonary disease, unspecified: Secondary | ICD-10-CM

## 2021-04-14 NOTE — Telephone Encounter (Signed)
I called [facility] to get an update on the home oxygen order. I spoke with [facility] agent who advised me that she was working on processing patient's orders. She states she spoke with patient yesterday and patient requested to have a POC (portable oxygen concentrator). [facility] agent advised me that in order for patient to qualify for a POC, we would have to do a 6 minute walk test to see if patient de-sat during 6 minute walk, and to see if 02 sat came back up after applying supplemental oxygen. [facility] agent states that the documentation that was provided only mentions that her oxygen dropped to 85% room air while walking. To move forward with processing the orders for POC, [facility] will need documentation of a 6 minute walk test.   [facility] number:  [phone]

## 2021-04-15 NOTE — Telephone Encounter (Signed)
I called [facility] and spoke with agent Terri. She advised me that they have everything they need to continue with processing Dr. Maralyn Sago orders. They no longer needs a 6 minute walk test. Terri states that [place]ett[place] Caryl Pina is coming by our office today to drop off an order form that just needs to be signed . I advised Caryl Pina that Dr. Caryn Section is out of the office today, but will return tomorrow.

## 2021-04-15 NOTE — Telephone Encounter (Signed)
She may be looking at the note before it was finished. The note was finished yesterday and documented that SaO2 went back up to normal with oxygen.

## 2021-04-15 NOTE — Telephone Encounter (Signed)
[facility] sales agent Ashly came by the office. He provided a handout on how to place an order directly in Epic for home oxygen. Order has been placed and is pending in this encounter. Please review and sign. Once order has been signed, [facility] will deliver oxygen to patient's home.

## 2021-04-16 ENCOUNTER — Ambulatory Visit (INDEPENDENT_AMBULATORY_CARE_PROVIDER_SITE_OTHER): Payer: Medicare Other

## 2021-04-16 DIAGNOSIS — J449 Chronic obstructive pulmonary disease, unspecified: Secondary | ICD-10-CM

## 2021-04-16 DIAGNOSIS — E1121 Type 2 diabetes mellitus with diabetic nephropathy: Secondary | ICD-10-CM

## 2021-04-16 NOTE — Patient Instructions (Signed)
Thank you for allowing the Chronic Care Management team to participate in your care. It was great speaking with you today!   Our next appointment is May 21, 2021 at 2 pm.  Please call the care guide team at [phone] if you need to cancel or reschedule your appointment.    Please don't hesitate to contact me if you require assistance prior to our next scheduled telephone appointment.   [facility] Care Management [facility] [phone]

## 2021-04-16 NOTE — Chronic Care Management (AMB) (Unsigned)
Chronic Care Management   CCM RN Visit Note  04/16/2021 Name: Hannah Kirby MRN: [id number] DOB: 07-09-1945  Subjective: Hannah Kirby is a 76 y.o. year old female who is a primary care patient of Fisher, Kirstie Peri, MD. The care management team was consulted for assistance with disease management and care coordination needs.    Engaged with patient by telephone for follow up visit in response to provider referral for case management and care coordination services.   Consent to Services:  The patient was given information about Chronic Care Management services, agreed to services, and gave verbal consent prior to initiation of services.  Please see initial visit note for detailed documentation.   Assessment: Review of patient past medical history, allergies, medications, health status, including review of consultants reports, laboratory and other test data, was performed as part of comprehensive evaluation and provision of chronic care management services.   SDOH (Social Determinants of Health) assessments and interventions performed: No  CCM Care Plan  Allergies  Allergen Reactions   Spironolactone Shortness Of Breath   Sulfonamide Derivatives Other (See Comments)    Last taken as a child; made her mouth break out   Other Other (See Comments)    Mouth blisters   Sulfa Antibiotics Other (See Comments)    Mouth blisters Welts on mouth    Codeine Nausea And Vomiting and Other (See Comments)    Nausea   Prednisone Palpitations and Other (See Comments)    Made her legs "feel weird."    Outpatient Encounter Medications as of 04/16/2021  Medication Sig   acetaminophen (TYLENOL) 500 MG tablet Take 500 mg by mouth every 6 (six) hours as needed.   albuterol (VENTOLIN HFA) 108 (90 Base) MCG/ACT inhaler Inhale 2 puffs into the lungs every 4 (four) hours as needed for wheezing or shortness of breath.   amLODipine (NORVASC) 10 MG tablet Take by mouth.   amLODipine (NORVASC) 5 MG  tablet Take 1 tablet (5 mg total) by mouth daily.   aspirin (ASPIR-81) 81 MG EC tablet Take 81 mg by mouth daily.   clopidogrel (PLAVIX) 75 MG tablet Take 1 tablet (75 mg total) by mouth daily.   cyanocobalamin 1000 MCG tablet Take 1,000 mcg by mouth daily.   ezetimibe (ZETIA) 10 MG tablet Take 1 tablet (10 mg total) by mouth daily.   fluticasone furoate-vilanterol (BREO ELLIPTA) 100-25 MCG/ACT AEPB Inhale 1 puff into the lungs daily.   HYDROcodone-acetaminophen (NORCO/VICODIN) 5-325 MG tablet Take 1 tablet by mouth every 6 (six) hours as needed.   insulin glargine (LANTUS) 100 UNIT/ML injection Inject 22 Units into the skin daily.    lisinopril (ZESTRIL) 20 MG tablet Take 1 tablet (20 mg total) by mouth daily.   metoprolol tartrate (LOPRESSOR) 100 MG tablet Take 1 tablet (100 mg total) by mouth 2 (two) times daily.   Multiple Vitamins-Minerals (DAILY MULTI) TABS Take 1 tablet by mouth daily.   nitroGLYCERIN (NITROSTAT) 0.4 MG SL tablet Place 1 tablet (0.4 mg total) under the tongue every 5 (five) minutes as needed.   Omega-3 Fatty Acids (FISH OIL) 1000 MG CAPS Take 2 capsules by mouth in the morning and at bedtime.    ondansetron (ZOFRAN ODT) 4 MG disintegrating tablet Take 1 tablet (4 mg total) by mouth every 8 (eight) hours as needed for nausea or vomiting.   RABEprazole (ACIPHEX) 20 MG tablet Take 1 tablet (20 mg total) by mouth daily. 15 Mins. before evening meal   rosuvastatin (CRESTOR) 20 MG  tablet Take 1 tablet (20 mg total) by mouth daily.   Semaglutide,0.25 or 0.5MG /DOS, 2 MG/1.5ML SOPN Inject 0.5 mg into the skin once a week.   sucralfate (CARAFATE) 1 g tablet Take 1 tablet (1 g total) by mouth 2 (two) times daily. 15 Minutes before evening meal and at bed time   tiotropium (SPIRIVA) 18 MCG inhalation capsule Place 1 capsule (18 mcg total) into inhaler and inhale daily.   No facility-administered encounter medications on file as of 04/16/2021.    Patient Active Problem List    Diagnosis Date Noted   Restrictive airway disease 07/21/2020   Partial thickness rotator cuff tear 03/20/2020   Shoulder pain 03/05/2020   Retinal detachment, left 03/04/2020   Ataxia 02/28/2020   Difficulty walking 02/28/2020   Physical deconditioning 02/28/2020   Chronic diastolic congestive heart failure ([place]) 11/20/2019   Polyp of ascending colon    Acute hemorrhagic colitis 08/08/2019   Type II diabetes mellitus with renal manifestations ([place]) 08/08/2019   Hypokalemia 08/08/2019   TIA (transient ischemic attack) 08/01/2019   Benign hypertensive kidney disease with chronic kidney disease 04/25/2019   Type 2 diabetes mellitus with diabetic chronic kidney disease ([place]) 04/25/2019   Secondary hyperparathyroidism of renal origin ([place]) 10/24/2018   Chronic, continuous use of opioids 03/26/2018   History of adenomatous polyp of colon 01/22/2018   Ulceration of colon determined by endoscopy 01/22/2018   Diverticulosis of colon 01/22/2018   History of CVA (cerebrovascular accident) 11/08/2016   Weakness of left upper extremity 10/30/2016   AAA (abdominal aortic aneurysm) without rupture 08/12/2016   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   Stage 3b chronic kidney disease ([place]) 07/27/2015   Chest pain at rest 05/06/2015   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[place]) 10/28/2014   Solitary pulmonary nodule on lung CT 08/20/2014   Allergic rhinitis 07/28/2014   CAFL (chronic airflow limitation) ([place]) 07/28/2014   Acid reflux 07/28/2014   Obesity    Granuloma annulare    Back pain 11/12/2013   Scoliosis 10/30/2013   Lumbar radiculopathy 10/30/2013   Lumbar canal stenosis 10/30/2013   Calcium blood increased 10/22/2013   Proteinuria 10/22/2013   Tobacco use disorder 03/21/2011   Obese 03/21/2011   Edema 08/18/2010   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

## 2021-04-20 DIAGNOSIS — E1121 Type 2 diabetes mellitus with diabetic nephropathy: Secondary | ICD-10-CM | POA: Diagnosis not present

## 2021-04-20 DIAGNOSIS — H3322 Serous retinal detachment, left eye: Secondary | ICD-10-CM | POA: Diagnosis not present

## 2021-04-20 DIAGNOSIS — Z794 Long term (current) use of insulin: Secondary | ICD-10-CM | POA: Diagnosis not present

## 2021-04-20 DIAGNOSIS — J449 Chronic obstructive pulmonary disease, unspecified: Secondary | ICD-10-CM

## 2021-04-20 LAB — HM DIABETES EYE EXAM

## 2021-04-29 ENCOUNTER — Telehealth: Payer: Self-pay

## 2021-04-29 NOTE — Telephone Encounter (Signed)
[employer] sales agent Ashly came by the office requesting to speak with Dr. Maralyn Sago medical assistant. Ashly says that patients home oxygen usage report shows a "chronic airflow limitation". Ashly states that this could be resulting from patients scoliosis of the spine that was seen on MRI from 02/27/2020. Ashly say that her abnormal spine curvature restricts her from taking a deep breath which doesn't allow enough oxygen to get into the lower lobes of her lungs. Ashly wants to know if Dr. Caryn Section would like to order a noninvasive mechanical ventilator. Ashly says that this machine targets improving the tidal volume. Arespiratory therapist would come into patients home once a month for a follow up.  Ashly says its covered 100% by patient's insurance and there is no cost to the patient. Ashly brought in a prefilled form for Dr. Caryn Section to review and sign if he wanted to order this machine. Alinda Dooms has been placed on Dr. Maralyn Sago desk for review. This form would need to be filled out once a year. Patient would get the machine and supplies.  ? ?In addition to the signed order, Ashly needs documentation faxed along with the order that documents the need for the machine. Ashly says that Dr. Caryn Section can make an addendum to office note from 04/12/2021. The Addendum should state:  ?-Patient has scoliosis which prevents proper ventilation. Patient needs non invasive ventilator to achieve tidal volume to sustain life and reduce hospitalizations.  ? ?Please advise.  ?

## 2021-05-05 NOTE — Telephone Encounter (Signed)
Hannah Kirby from [place] called to follow up with [name]/ and wanted to follow up on status of the order he dropped off to see if that was Dr Caryn Section wanted to do/ please advise  ?

## 2021-05-06 NOTE — Telephone Encounter (Signed)
I returned Ashly's call and advised him of message below.  ?

## 2021-05-06 NOTE — Telephone Encounter (Signed)
Form is completed and signed and addendum has been made to office note. I'll be dropping of the form at the office this afternoon.  ?

## 2021-05-11 ENCOUNTER — Encounter: Payer: Self-pay | Admitting: Family Medicine

## 2021-05-11 ENCOUNTER — Encounter: Payer: Self-pay | Admitting: Cardiovascular Disease

## 2021-05-11 DIAGNOSIS — M48061 Spinal stenosis, lumbar region without neurogenic claudication: Secondary | ICD-10-CM

## 2021-05-11 MED ORDER — HYDROCODONE-ACETAMINOPHEN 5-325 MG PO TABS: 1.0000 | ORAL_TABLET | Freq: Four times a day (QID) | ORAL | 0 refills | Status: DC | PRN

## 2021-05-12 NOTE — Progress Notes (Signed)
?  ?  ? ? ? ?Date:  05/13/2021  ? ?ID:  Hannah Kirby, DOB [birth year], MRN [id number] ? ?Patient Location:  ?[address] ?[place] [id number]  ? ?Provider location:   ?[place], [employer] office ? ?PCP:  Birdie Sons, MD  ?Cardiologist:  [facility] ? ? ?Chief Complaint  ?Patient presents with  ? OTHER  ?  Patient c/o being fatigue and having some shortness of breath. Her PCP ordered Oxygen for her but she is not using very often. Patient would like to discuss her BP medication, she has stopped taking the Amlodipine since last Saturday. Medications verbally reviewed with patient.   ? ? ?History of Present Illness:   ? ?Hannah Kirby is a 76 y.o. female past medical history of ?diabetes,  ?obesity,  ?Anxiety, panic attacks ?significantly improved with  weight loss on  Victoza,  ?coronary artery disease with stenting of her RCA  in January 2008,  ?long smoking history who continues to smoke,  ?hyperlipidemia  ?outbreak of her granulomatous disease in summary 2013,  prednisone for this condition.  ?History pinched nerve,  on pain medication. ?Chronic constipation ?Chronic shortness of breath, COPD ?Stroke 2018, June 2021, discharged on aspirin Plavix,  previous strokes on MRI ?who presents for routine followup of her coronary artery disease and hypertension. ? ?Last seen in clinic by myself December 2022 ? ?In follow-up today reports chronic fatigue ?Sedentary at baseline ?Husband with medical issues, does not get out much ?Goes to grocery store, gets tired ?Husband having trouble, son helps ?No regular exercise program ? ?Reports has oxygen and generator, uses these as needed ?Long history of smoking, chronic bronchitis ?Denies anginal symptoms ? ?EKG personally reviewed by myself on todays visit ?Normal sinus rhythm rate 76 bpm no significant ST-T wave changes ? ?Other past medical history reviewed ? cardiac catheterization 2020 , stent to the RCA ?On aspirin Plavix ? ?Lab work  reviewed ?Cholesterol above goal ?A1C 6.7, down from 8 ? ?Cath 03/2020 ?Severe single-vessel coronary artery disease with 99% distal RCA stenosis. ?Mild, nonobstructive left coronary artery disease with minimal luminal irregularities involving the LAD and 20-30% ostial ramus stenosis. ?Patent proximal/mid RCA stents with mild to moderate in-stent restenosis (~30%). ?Mildly elevated left ventricular filling pressure (LVEDP 15-20 mmHg). ?Successful PCI to distal RCA using Resolute Onyx 3.0 x 15 mm drug-eluting stent with 0% residual stenosis and TIMI-3 flow. ?Right radial artery loop; consider alternative access for future catheterizations. ?  ?Recommendations: ?Remove right femoral artery sheath 2 hours after completion of procedure. ?Dual antiplatelet therapy with aspirin and clopidogrel for at least 12 months. ?Aggressive secondary prevention. ?Obtain echocardiogram. ?Gentle post catheterization hydration in the setting of chronic kidney disease and mildly elevated left ventricular filling pressure. ? ?Past medical history reviewed ? hospital July 2021  ?with bloody diarrhea, leukocytosis ?C.diff: Positive ?Treated with vancomycin ? ?In 2020, torn retina, symptoms started with floaters and vision deficits ? had laser treatment, and follow-up with [facility] ? ?CT head: ?No CT evidence of acute intracranial abnormality. ?Moderate chronic small vessel ischemic disease with redemonstrated ?chronic lacunar infarcts in the bilateral basal ganglia, right ?thalamus and within the pons. ?Stable, mild generalized parenchymal atrophy. ? ?MRI ?1. 8 mm acute lacunar infarct within the right thalamocapsular ?junction. ?2. An additional small acute white matter infarct is questioned ?within the left corona radiata, as described. ? ?Stress test 11/2017 ?The study is normal. ?This is a low risk study. ?The left ventricular ejection fraction is  normal (55-65%). ? ?Previously had side effects on spironolactone, palpitations ? ?On aspirin  Plavix for prior stroke 2018 ?  ?Chronically elevated WBC ? ?hospital with stroke 10/30/2016 ?Left upper extremity weakness on coming back from the beach while in the car ?She was on aspirin and Plavix at the time ?Echocardiogram normal ejection fractionno significant valve disease, moderate LVH, diastolic dysfunction ?  ?MRI brain: Small acute right basal ganglia region infarct. Prior strokes ?MRA: negative. ?Carotid duplex showed bilateral 0-49% stenosis. ? ? ?Past Medical History:  ?Diagnosis Date  ? Anemia   ? Anxiety   ? Arthritis   ? C. difficile colitis 08/09/2019  ? CAD (coronary artery disease)   ? a. 02/2006 PCI: BMS x 2 to RCA, cath o/w without significant coronary disease; b. nuclear stress test 07/2014: No ischemia/infarct; c. 11/2017 MV: no isch/infarct, EF 55-65%; d. 03/2020 NSTEMI/PCI: LM nl, LAD min irregs, RI 25, small, LCX nl, RCA 30p/m ISR, 99d (3.0x15 Resolute Onyx DES).  ? Chronic bronchitis ([place])   ? secondary to cigarette smoking  ? Chronic kidney disease (CKD), stage III (moderate) (HCC)   ? COPD (chronic obstructive pulmonary disease) ([place])   ? Diabetes mellitus   ? Diastolic dysfunction   ? a. 07/2019 Echo: EF 60-65%, no rwma, mod LVH. Nl RV size/fxn; b. 03/2020 Echo: EF 60-65%, mod LVH, gr1 DD, nl RV size/fxn, mild AS.  ? FHx: allergies   ? GERD (gastroesophageal reflux disease)   ? Goiter   ? Granulomatous disease ([place])   ? Hernia   ? Hyperlipidemia   ? Hypertension   ? Kidney stone on left side 2013  ? Microalbuminuria   ? NSTEMI (non-ST elevated myocardial infarction) ([place]) 03/23/2020  ? Obesity   ? Panic attacks   ? PVC's (premature ventricular contractions)   ? a. 03/2018 Zio: Occas PVCs (2.5%). Triggered events assoc w/ PVC/PAC.  ? Retinal tear 2020  ? Smokers' cough ([place])   ? Spinal stenosis   ? Stroke ([place]) 10/29/2016  ? mild left side weakness  ? Stroke ([place]) 08/01/2019  ? Tobacco abuse   ? ?Past Surgical History:  ?Procedure Laterality Date  ? ABDOMINAL HYSTERECTOMY    ? BREAST SURGERY     ? CATARACT EXTRACTION W/PHACO Right 03/01/2017  ? Procedure: CATARACT EXTRACTION PHACO AND INTRAOCULAR LENS PLACEMENT ([place]) RIGHT DIABETIC;  Surgeon: Leandrew Koyanagi, MD;  Location: [facility];  Service: Ophthalmology;  Laterality: Right;  ? CATARACT EXTRACTION W/PHACO Left 03/22/2017  ? Procedure: CATARACT EXTRACTION PHACO AND INTRAOCULAR LENS PLACEMENT ([place]) LEFT DIABETIC;  Surgeon: Leandrew Koyanagi, MD;  Location: [facility];  Service: Ophthalmology;  Laterality: Left;  Diabetic - insulin and oral meds  ? COLONOSCOPY WITH PROPOFOL N/A 09/23/2014  ? Procedure: COLONOSCOPY WITH PROPOFOL;  Surgeon: Lollie Sails, MD;  Location[facility] ENDOSCOPY;  Service: Endoscopy;  Laterality: N/A;  ? COLONOSCOPY WITH PROPOFOL N/A 01/11/2018  ? Procedure: COLONOSCOPY WITH PROPOFOL;  Surgeon: Lollie Sails, MD;  Location: [facility] ENDOSCOPY;  Service: Endoscopy;  Laterality: N/A;  ? COLONOSCOPY WITH PROPOFOL N/A 04/24/2018  ? Procedure: COLONOSCOPY WITH PROPOFOL;  Surgeon: Lollie Sails, MD;  Location: [facility] ENDOSCOPY;  Service: Endoscopy;  Laterality: N/A;  ? COLONOSCOPY WITH PROPOFOL N/A 11/19/2019  ? Procedure: COLONOSCOPY WITH PROPOFOL;  Surgeon: Lucilla Lame, MD;  Location: [facility] ENDOSCOPY;  Service: Endoscopy;  Laterality: N/A;  ? CORONARY ANGIOPLASTY WITH STENT PLACEMENT  2008  ? CORONARY STENT INTERVENTION N/A 03/24/2020  ? Procedure: CORONARY STENT INTERVENTION;  Surgeon: End,  [name], MD;  Location: [facility] CV LAB;  Service: Cardiovascular;  Laterality: N/A;  ? ESOPHAGOGASTRODUODENOSCOPY (EGD) WITH PROPOFOL N/A 12/30/2014  ? Procedure: ESOPHAGOGASTRODUODENOSCOPY (EGD) WITH PROPOFOL;  Surgeon: Lollie Sails, MD;  Location: [facility] ENDOSCOPY;  Service: Endoscopy;  Laterality: N/A;  ? ESOPHAGOGASTRODUODENOSCOPY (EGD) WITH PROPOFOL N/A 07/19/2016  ? Procedure: ESOPHAGOGASTRODUODENOSCOPY (EGD) WITH PROPOFOL;  Surgeon: Lollie Sails, MD;  Location: [facility] ENDOSCOPY;  Service: Endoscopy;   Laterality: N/A;  ? ESOPHAGOGASTRODUODENOSCOPY (EGD) WITH PROPOFOL N/A 04/24/2018  ? Procedure: ESOPHAGOGASTRODUODENOSCOPY (EGD) WITH PROPOFOL;  Surgeon: Lollie Sails, MD;  Location: [facility] ENDOSCOPY;  Lewanda Rife

## 2021-05-13 ENCOUNTER — Other Ambulatory Visit: Payer: Self-pay

## 2021-05-13 ENCOUNTER — Encounter: Payer: Self-pay | Admitting: Cardiovascular Disease

## 2021-05-13 ENCOUNTER — Ambulatory Visit (INDEPENDENT_AMBULATORY_CARE_PROVIDER_SITE_OTHER): Payer: Medicare Other | Admitting: Cardiovascular Disease

## 2021-05-13 VITALS — BP 134/80 | HR 76 | Ht 66.0 in | Wt 165.0 lb

## 2021-05-13 DIAGNOSIS — N183 Chronic kidney disease, stage 3 unspecified: Secondary | ICD-10-CM | POA: Diagnosis not present

## 2021-05-13 DIAGNOSIS — I25118 Atherosclerotic heart disease of native coronary artery with other forms of angina pectoris: Secondary | ICD-10-CM | POA: Diagnosis not present

## 2021-05-13 DIAGNOSIS — I2510 Atherosclerotic heart disease of native coronary artery without angina pectoris: Secondary | ICD-10-CM | POA: Diagnosis not present

## 2021-05-13 DIAGNOSIS — I714 Abdominal aortic aneurysm, without rupture, unspecified: Secondary | ICD-10-CM

## 2021-05-13 DIAGNOSIS — E785 Hyperlipidemia, unspecified: Secondary | ICD-10-CM | POA: Diagnosis not present

## 2021-05-13 DIAGNOSIS — I10 Essential (primary) hypertension: Secondary | ICD-10-CM

## 2021-05-13 DIAGNOSIS — N1832 Chronic kidney disease, stage 3b: Secondary | ICD-10-CM | POA: Diagnosis not present

## 2021-05-13 DIAGNOSIS — Z720 Tobacco use: Secondary | ICD-10-CM

## 2021-05-13 DIAGNOSIS — I5032 Chronic diastolic (congestive) heart failure: Secondary | ICD-10-CM

## 2021-05-13 NOTE — Patient Instructions (Signed)
Medication Instructions:  No changes  If you need a refill on your cardiac medications before your next appointment, please call your pharmacy.   Lab work: No new labs needed  Testing/Procedures: No new testing needed  Follow-Up: At [facility], you and your health needs are our priority.  As part of our continuing mission to provide you with exceptional heart care, we have created designated Provider Care Teams.  These Care Teams include your primary Cardiologist (physician) and Advanced Practice Providers (APPs -  Physician Assistants and Nurse Practitioners) who all work together to provide you with the care you need, when you need it.  You will need a follow up appointment in 12 months  Providers on your designated Care Team:   Christopher Berge, NP Ryan Dunn, PA-C Cadence Furth, PA-C  COVID-19 Vaccine Information can be found at: [email or web address] For questions related to vaccine distribution or appointments, please email [email or web address] or call [phone].   

## 2021-05-18 ENCOUNTER — Other Ambulatory Visit: Payer: Self-pay

## 2021-05-18 ENCOUNTER — Ambulatory Visit (INDEPENDENT_AMBULATORY_CARE_PROVIDER_SITE_OTHER): Payer: Medicare Other | Admitting: Gastroenterology

## 2021-05-18 ENCOUNTER — Encounter: Payer: Self-pay | Admitting: Gastroenterology

## 2021-05-18 VITALS — BP 148/96 | HR 86 | Temp 97.9°F | Wt 166.0 lb

## 2021-05-18 DIAGNOSIS — K219 Gastro-esophageal reflux disease without esophagitis: Secondary | ICD-10-CM

## 2021-05-18 DIAGNOSIS — I2510 Atherosclerotic heart disease of native coronary artery without angina pectoris: Secondary | ICD-10-CM | POA: Diagnosis not present

## 2021-05-18 DIAGNOSIS — K2270 Barrett's esophagus without dysplasia: Secondary | ICD-10-CM | POA: Diagnosis not present

## 2021-05-18 MED ORDER — RABEPRAZOLE SODIUM 20 MG PO TBEC: 20.0000 mg | DELAYED_RELEASE_TABLET | Freq: Every day | ORAL | 3 refills | Status: DC

## 2021-05-18 MED ORDER — SUCRALFATE 1 G PO TABS: 1.0000 g | ORAL_TABLET | Freq: Two times a day (BID) | ORAL | 3 refills | Status: DC

## 2021-05-18 NOTE — Progress Notes (Signed)
? ? ?Primary Care Physician: Birdie Sons, MD ? ?Primary Gastroenterologist:  Dr. Lucilla Lame ? ?Chief Complaint  ?Patient presents with  ? Follow-up  ? Medication Refill  ? ? ?HPI: Hannah Kirby is a 76 y.o. female here for refill of her medications.  The patient states that she has been doing well on her PPI and supper for a.  The patient is presently taking AcipHex. The patient denies any unexplained weight loss fevers chills nausea vomiting black stools or bloody stools.  The patient does report that she has had an EGD in 2020 that showed Barrett's esophagus.  She has not had a repeat EGD since then. ? ?Past Medical History:  ?Diagnosis Date  ? Anemia   ? Anxiety   ? Arthritis   ? C. difficile colitis 08/09/2019  ? CAD (coronary artery disease)   ? a. 02/2006 PCI: BMS x 2 to RCA, cath o/w without significant coronary disease; b. nuclear stress test 07/2014: No ischemia/infarct; c. 11/2017 MV: no isch/infarct, EF 55-65%; d. 03/2020 NSTEMI/PCI: LM nl, LAD min irregs, RI 25, small, LCX nl, RCA 30p/m ISR, 99d (3.0x15 Resolute Onyx DES).  ? Chronic bronchitis ([place])   ? secondary to cigarette smoking  ? Chronic kidney disease (CKD), stage III (moderate) (HCC)   ? COPD (chronic obstructive pulmonary disease) ([place])   ? Diabetes mellitus   ? Diastolic dysfunction   ? a. 07/2019 Echo: EF 60-65%, no rwma, mod LVH. Nl RV size/fxn; b. 03/2020 Echo: EF 60-65%, mod LVH, gr1 DD, nl RV size/fxn, mild AS.  ? FHx: allergies   ? GERD (gastroesophageal reflux disease)   ? Goiter   ? Granulomatous disease ([place])   ? Hernia   ? Hyperlipidemia   ? Hypertension   ? Kidney stone on left side 2013  ? Microalbuminuria   ? NSTEMI (non-ST elevated myocardial infarction) ([place]) 03/23/2020  ? Obesity   ? Panic attacks   ? PVC's (premature ventricular contractions)   ? a. 03/2018 Zio: Occas PVCs (2.5%). Triggered events assoc w/ PVC/PAC.  ? Retinal tear 2020  ? Smokers' cough ([place])   ? Spinal stenosis   ? Stroke ([place]) 10/29/2016  ? mild left side  weakness  ? Stroke ([place]) 08/01/2019  ? Tobacco abuse   ? ? ?Current Outpatient Medications  ?Medication Sig Dispense Refill  ? acetaminophen (TYLENOL) 500 MG tablet Take 500 mg by mouth every 6 (six) hours as needed.    ? albuterol (VENTOLIN HFA) 108 (90 Base) MCG/ACT inhaler Inhale 2 puffs into the lungs every 4 (four) hours as needed for wheezing or shortness of breath. 48 g 3  ? aspirin (ASPIR-81) 81 MG EC tablet Take 81 mg by mouth daily.    ? clopidogrel (PLAVIX) 75 MG tablet Take 1 tablet (75 mg total) by mouth daily. 90 tablet 3  ? cyanocobalamin 1000 MCG tablet Take 1,000 mcg by mouth daily.    ? ezetimibe (ZETIA) 10 MG tablet Take 1 tablet (10 mg total) by mouth daily. 90 tablet 3  ? fluticasone furoate-vilanterol (BREO ELLIPTA) 100-25 MCG/ACT AEPB Inhale 1 puff into the lungs daily. 84 each 3  ? HYDROcodone-acetaminophen (NORCO/VICODIN) 5-325 MG tablet Take 1 tablet by mouth every 6 (six) hours as needed. 120 tablet 0  ? insulin glargine (LANTUS) 100 UNIT/ML injection Inject 22 Units into the skin daily.     ? lisinopril (ZESTRIL) 20 MG tablet Take 1 tablet (20 mg total) by mouth daily. 90 tablet 3  ? metoprolol  tartrate (LOPRESSOR) 100 MG tablet Take 1 tablet (100 mg total) by mouth 2 (two) times daily. 180 tablet 3  ? Multiple Vitamins-Minerals (DAILY MULTI) TABS Take 1 tablet by mouth daily.    ? nitroGLYCERIN (NITROSTAT) 0.4 MG SL tablet Place 1 tablet (0.4 mg total) under the tongue every 5 (five) minutes as needed. 25 tablet 6  ? Omega-3 Fatty Acids (FISH OIL) 1000 MG CAPS Take 2 capsules by mouth in the morning and at bedtime.     ? ondansetron (ZOFRAN ODT) 4 MG disintegrating tablet Take 1 tablet (4 mg total) by mouth every 8 (eight) hours as needed for nausea or vomiting. 20 tablet 0  ? rosuvastatin (CRESTOR) 20 MG tablet Take 1 tablet (20 mg total) by mouth daily. 90 tablet 3  ? Semaglutide,0.25 or 0.'5MG'$ /DOS, 2 MG/1.5ML SOPN Inject 0.5 mg into the skin once a week.    ? tiotropium (SPIRIVA) 18  MCG inhalation capsule Place 1 capsule (18 mcg total) into inhaler and inhale daily. 90 capsule 4  ? RABEprazole (ACIPHEX) 20 MG tablet Take 1 tablet (20 mg total) by mouth daily. 15 Mins. before evening meal 90 tablet 3  ? sucralfate (CARAFATE) 1 g tablet Take 1 tablet (1 g total) by mouth 2 (two) times daily. 15 Minutes before evening meal and at bed time 180 tablet 3  ? ?No current facility-administered medications for this visit.  ? ? ?Allergies as of 05/18/2021 - Review Complete 05/18/2021  ?Allergen Reaction Noted  ? Spironolactone Shortness Of Breath 10/05/2018  ? Sulfonamide derivatives Other (See Comments)   ? Other Other (See Comments) 04/27/2015  ? Sulfa antibiotics Other (See Comments) 10/22/2013  ? Codeine Nausea And Vomiting and Other (See Comments) 04/27/2015  ? Prednisone Palpitations and Other (See Comments) 10/04/2011  ? ? ?ROS: ? ?General: Negative for anorexia, weight loss, fever, chills, fatigue, weakness. ?ENT: Negative for hoarseness, difficulty swallowing , nasal congestion. ?CV: Negative for chest pain, angina, palpitations, dyspnea on exertion, peripheral edema.  ?Respiratory: Negative for dyspnea at rest, dyspnea on exertion, cough, sputum, wheezing.  ?GI: See history of present illness. ?GU:  Negative for dysuria, hematuria, urinary incontinence, urinary frequency, nocturnal urination.  ?Endo: Negative for unusual weight change.  ?  ?Physical Examination: ? ? BP (!) 148/96   Pulse 86   Temp 97.9 ?F (36.6 ?C) (Oral)   Wt 166 lb (75.3 kg)   BMI 26.79 kg/m?  ? ?General: Well-nourished, well-developed in no acute distress.  ?Eyes: No icterus. Conjunctivae pink. ?Neuro: Alert and oriented x 3.  Grossly intact. ?Skin: Warm and dry, no jaundice.   ?Psych: Alert and cooperative, normal mood and affect. ? ?Labs:  ?  ?Imaging Studies: ?No results found. ? ?Assessment and Plan:  ? ?Hannah Kirby is a 76 y.o. y/o female who comes in today with a history of Barrett's esophagus and needs a  refill of her medications.  The patient will have her AcipHex and start Carafate renewed and she will be set up for an EGD because of her history of Barrett's esophagus.  The patient has been spent the plan and agrees with it. ? ? ? ? ?Lucilla Lame, MD. [facility]al ? ? ? Note: This dictation was prepared with Dragon dictation along with smaller phrase technology. Any transcriptional errors that result from this process are unintentional.  ?

## 2021-05-21 ENCOUNTER — Ambulatory Visit (INDEPENDENT_AMBULATORY_CARE_PROVIDER_SITE_OTHER): Payer: Medicare Other

## 2021-05-21 DIAGNOSIS — J449 Chronic obstructive pulmonary disease, unspecified: Secondary | ICD-10-CM

## 2021-05-21 NOTE — Chronic Care Management (AMB) (Signed)
?Chronic Care Management  ? ?CCM RN Visit Note ? ?05/21/2021 ?Name: Hannah Kirby MRN: [id number] DOB: 1945/10/04 ? ?Subjective: ?Hannah Kirby is a 76 y.o. year old female who is a primary care patient of Fisher, Kirstie Peri, MD. The care management team was consulted for assistance with disease management and care coordination needs.   ? ?Engaged with patient by telephone for follow up visit in response to provider referral for case management and care coordination services.  ? ?Consent to Services:  ?The patient was given information about Chronic Care Management services, agreed to services, and gave verbal consent prior to initiation of services.  Please see initial visit note for detailed documentation.  ? ?Assessment: Review of patient past medical history, allergies, medications, health status, including review of consultants reports, laboratory and other test data, was performed as part of comprehensive evaluation and provision of chronic care management services.  ? ?SDOH (Social Determinants of Health) assessments and interventions performed: No ? ?CCM Care Plan ? ?Allergies  ?Allergen Reactions  ? Spironolactone Shortness Of Breath  ? Sulfonamide Derivatives Other (See Comments)  ?  Last taken as a child; made her mouth break out  ? Other Other (See Comments)  ?  Mouth blisters  ? Sulfa Antibiotics Other (See Comments)  ?  Mouth blisters ?Welts on mouth ?  ? Codeine Nausea And Vomiting and Other (See Comments)  ?  Nausea  ? Prednisone Palpitations and Other (See Comments)  ?  Made her legs "feel weird."  ? ? ?Outpatient Encounter Medications as of 05/21/2021  ?Medication Sig  ? acetaminophen (TYLENOL) 500 MG tablet Take 500 mg by mouth every 6 (six) hours as needed.  ? albuterol (VENTOLIN HFA) 108 (90 Base) MCG/ACT inhaler Inhale 2 puffs into the lungs every 4 (four) hours as needed for wheezing or shortness of breath.  ? aspirin (ASPIR-81) 81 MG EC tablet Take 81 mg by mouth daily.  ? clopidogrel  (PLAVIX) 75 MG tablet Take 1 tablet (75 mg total) by mouth daily.  ? cyanocobalamin 1000 MCG tablet Take 1,000 mcg by mouth daily.  ? ezetimibe (ZETIA) 10 MG tablet Take 1 tablet (10 mg total) by mouth daily.  ? fluticasone furoate-vilanterol (BREO ELLIPTA) 100-25 MCG/ACT AEPB Inhale 1 puff into the lungs daily.  ? HYDROcodone-acetaminophen (NORCO/VICODIN) 5-325 MG tablet Take 1 tablet by mouth every 6 (six) hours as needed.  ? insulin glargine (LANTUS) 100 UNIT/ML injection Inject 22 Units into the skin daily.   ? lisinopril (ZESTRIL) 20 MG tablet Take 1 tablet (20 mg total) by mouth daily.  ? metoprolol tartrate (LOPRESSOR) 100 MG tablet Take 1 tablet (100 mg total) by mouth 2 (two) times daily.  ? Multiple Vitamins-Minerals (DAILY MULTI) TABS Take 1 tablet by mouth daily.  ? nitroGLYCERIN (NITROSTAT) 0.4 MG SL tablet Place 1 tablet (0.4 mg total) under the tongue every 5 (five) minutes as needed.  ? Omega-3 Fatty Acids (FISH OIL) 1000 MG CAPS Take 2 capsules by mouth in the morning and at bedtime.   ? ondansetron (ZOFRAN ODT) 4 MG disintegrating tablet Take 1 tablet (4 mg total) by mouth every 8 (eight) hours as needed for nausea or vomiting.  ? RABEprazole (ACIPHEX) 20 MG tablet Take 1 tablet (20 mg total) by mouth daily. 15 Mins. before evening meal  ? rosuvastatin (CRESTOR) 20 MG tablet Take 1 tablet (20 mg total) by mouth daily.  ? Semaglutide,0.25 or 0.5MG/DOS, 2 MG/1.5ML SOPN Inject 0.5 mg into the skin once a  week.  ? sucralfate (CARAFATE) 1 g tablet Take 1 tablet (1 g total) by mouth 2 (two) times daily. 15 Minutes before evening meal and at bed time  ? tiotropium (SPIRIVA) 18 MCG inhalation capsule Place 1 capsule (18 mcg total) into inhaler and inhale daily.  ? ?No facility-administered encounter medications on file as of 05/21/2021.  ? ? ?Patient Active Problem List  ? Diagnosis Date Noted  ? Restrictive airway disease 07/21/2020  ? Partial thickness rotator cuff tear 03/20/2020  ? Shoulder pain  03/05/2020  ? Retinal detachment, left 03/04/2020  ? Ataxia 02/28/2020  ? Difficulty walking 02/28/2020  ? Physical deconditioning 02/28/2020  ? Chronic diastolic congestive heart failure ([place]) 11/20/2019  ? Polyp of ascending colon   ? Acute hemorrhagic colitis 08/08/2019  ? Type II diabetes mellitus with renal manifestations ([place]) 08/08/2019  ? Hypokalemia 08/08/2019  ? TIA (transient ischemic attack) 08/01/2019  ? Benign hypertensive kidney disease with chronic kidney disease 04/25/2019  ? Type 2 diabetes mellitus with diabetic chronic kidney disease ([place]) 04/25/2019  ? Secondary hyperparathyroidism of renal origin ([place]) 10/24/2018  ? Chronic, continuous use of opioids 03/26/2018  ? History of adenomatous polyp of colon 01/22/2018  ? Ulceration of colon determined by endoscopy 01/22/2018  ? Diverticulosis of colon 01/22/2018  ? History of CVA (cerebrovascular accident) 11/08/2016  ? Weakness of left upper extremity 10/30/2016  ? AAA (abdominal aortic aneurysm) without rupture 08/12/2016  ? 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  ? Stage 3b chronic kidney disease ([place]) 07/27/2015  ? Chest pain at rest 05/06/2015  ? 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[place]) 10/28/2014  ? Solitary pulmonary nodule on lung CT 08/20/2014  ? Allergic rhinitis 07/28/2014  ? CAFL (chronic airflow limitation) ([place]) 07/28/2014  ? Acid reflux 07/28/2014  ? Obesity   ? Granuloma annulare   ? Back pain 11/12/2013  ? Scoliosis 10/30/2013  ? Lumbar radiculopathy 10/30/2013  ? Lumbar canal stenosis 10/30/2013  ? Calcium blood increased 10/22/2013  ? Proteinuria 10/22/2013  ? Tobacco use disorder 03/21/2011  ? Obese 03/21/2011  ? Edema 08/18/2010  ? 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 Coronary artery disease 03/25/2009  ? Essential hypertension 03/25/2009  ? ? ? ?Care Plan : RN Care Management Plan of Care  ?  ?  ? ?Problem: CAL, DM, HTN and HLD   ?  ? ?Long-Range Goal: Disease Progression Prevented or Minimized   ?Start  Date: 03/18/2021  ?Expected End Date: 06/16/2021  ?Priority: High  ?Note:   ? ? ?Current Barriers:  ?Chronic Disease Management support and education needs related to Chronic Airflow Limitation, HTN, HLD, and DMII. ? ?RNCM Clinical Goal(s):  ?Patient will demonstrate Ongoing adherence to prescribed treatment plan for  Chronic Airflow Limitation, HTN, HLD, and DM through collaboration with the provider,  RN Care Manager and the care team. ? ?Interventions: ?1:1 collaboration with primary care provider regarding development and update of comprehensive plan of care as evidenced by provider attestation and co-signature ?Inter-disciplinary care team collaboration (see longitudinal plan of care) ?Evaluation of current treatment plan related to  self management and patient's adherence to plan as established by provider ? ? ?Diabetes Interventions:   ?Discussed established A1c goal: <7% ?Lab Results  ?Component Value Date  ? HGBA1C 6.5 03/19/2021  ?  ?Reviewed plan for diabetes management. Reports taking medications as prescribed and monitoring blood glucose readings as advised. ?Reviewed blood glucose readings. Reports fasting readings have ranged from the 110s to 120s. Advised to continue monitoring and maintain a  log. ?Reviewed s/sx of hypoglycemia and hyperglycemia along with recommended interventions. ?Reviewed nutritional intake. Reports improved adherence with maintaining a modified carb diet. Declined need for additional nutritional resources. ?Discussed importance of completing recommended preventive exams. Reports completing daily foot care as advised. She will complete an eye exam within the next week.  ? ?Hyperlipidemia Interventions:  ?Reviewed plan for management of hyperlipidemia. Reports excellent compliance with medications. Advised to notify provider if unable to tolerate prescribed regimen.  ?Reviewed importance of completing labs as prescribed. ?Discussed nutritional intake. Advised to continue readings  nutrition labels and limit foods high in cholesterol. ? ? ?Hypertension Interventions:  (Goal Met) ?BP Readings from Last 3 Encounters:  ?04/12/21 128/70  ?02/10/21 (!) 143/81  ?02/02/21 112/72  ?Reviewed plan for

## 2021-05-21 NOTE — Patient Instructions (Signed)
Thank you for allowing the Chronic Care Management team to participate in your care. It was great speaking with you today! ? ? ?Our next appointment is August 20, 2021 at 2 pm. ?Please call the care guide team at [phone] if you need to cancel or reschedule your appointment.  ? ? ?Please do not hesitate to contact me if you require assistance prior to our next outreach. ? ?Rodderick Holtzer,RN ?[facility] ?[facility] ?[phone] ? ? ?

## 2021-06-14 DIAGNOSIS — Z20822 Contact with and (suspected) exposure to covid-19: Secondary | ICD-10-CM | POA: Diagnosis not present

## 2021-06-15 ENCOUNTER — Other Ambulatory Visit: Payer: Self-pay | Admitting: Family Medicine

## 2021-06-15 DIAGNOSIS — M48061 Spinal stenosis, lumbar region without neurogenic claudication: Secondary | ICD-10-CM

## 2021-06-15 MED ORDER — HYDROCODONE-ACETAMINOPHEN 5-325 MG PO TABS: 1.0000 | ORAL_TABLET | Freq: Four times a day (QID) | ORAL | 0 refills | Status: DC | PRN

## 2021-06-28 DIAGNOSIS — Z20822 Contact with and (suspected) exposure to covid-19: Secondary | ICD-10-CM | POA: Diagnosis not present

## 2021-07-26 ENCOUNTER — Telehealth: Payer: Self-pay | Admitting: Cardiovascular Disease

## 2021-07-26 ENCOUNTER — Telehealth: Payer: Self-pay

## 2021-07-26 ENCOUNTER — Other Ambulatory Visit: Payer: Self-pay

## 2021-07-26 DIAGNOSIS — K2270 Barrett's esophagus without dysplasia: Secondary | ICD-10-CM

## 2021-07-26 NOTE — Telephone Encounter (Signed)
   Pre-operative Risk Assessment    Patient Name: Hannah Kirby  DOB: [birth year] MRN: [id number]      Request for Surgical Clearance    Procedure:   EGD  Date of Surgery:  Clearance 08/03/21                                 Surgeon:  Not listed Surgeon's Group or Practice Name:  [facility]  Phone number:  [phone] Fax number:  [phone]   Type of Clearance Requested:   - Medical    Type of Anesthesia:  General    Additional requests/questions:    SignedAce Gins   07/26/2021, 11:07 AM

## 2021-07-26 NOTE — Telephone Encounter (Signed)
Request faxed to the office of Dr [name] with [facility] at the top

## 2021-07-27 ENCOUNTER — Telehealth: Payer: Self-pay | Admitting: Gastroenterology

## 2021-07-27 NOTE — Telephone Encounter (Signed)
I spoke to pt and she would like procedure moved to 8/8 [facility]

## 2021-07-27 NOTE — Telephone Encounter (Signed)
    Name: Hannah Kirby  DOB: Jun 29, 1945  MRN: [id number]  Primary Cardiologist: Ida Rogue, MD   Preoperative team, please contact this patient and set up a phone call appointment for further preoperative risk assessment. Please obtain consent and complete medication review. Thank you for your help.  I confirm that guidance regarding antiplatelet and oral anticoagulation therapy has been completed and, if necessary, noted below.    Leanor Kail, [place] 07/27/2021, 1:47 PM [place] [address] [place], [place] [address]

## 2021-07-27 NOTE — Telephone Encounter (Signed)
Pt left message to cancel procedure would like a call back to reschedule

## 2021-07-27 NOTE — Telephone Encounter (Signed)
Instructions with new date released to Mychart and mailed to pt

## 2021-08-03 ENCOUNTER — Ambulatory Visit: Payer: Medicare Other | Admitting: Cardiovascular Disease

## 2021-08-03 DIAGNOSIS — Z859 Personal history of malignant neoplasm, unspecified: Secondary | ICD-10-CM | POA: Diagnosis not present

## 2021-08-03 DIAGNOSIS — L578 Other skin changes due to chronic exposure to nonionizing radiation: Secondary | ICD-10-CM | POA: Diagnosis not present

## 2021-08-03 DIAGNOSIS — L570 Actinic keratosis: Secondary | ICD-10-CM | POA: Diagnosis not present

## 2021-08-03 DIAGNOSIS — L920 Granuloma annulare: Secondary | ICD-10-CM | POA: Diagnosis not present

## 2021-08-03 DIAGNOSIS — Z872 Personal history of diseases of the skin and subcutaneous tissue: Secondary | ICD-10-CM | POA: Diagnosis not present

## 2021-08-03 DIAGNOSIS — L821 Other seborrheic keratosis: Secondary | ICD-10-CM | POA: Diagnosis not present

## 2021-08-03 DIAGNOSIS — Z86018 Personal history of other benign neoplasm: Secondary | ICD-10-CM | POA: Diagnosis not present

## 2021-08-05 DIAGNOSIS — R270 Ataxia, unspecified: Secondary | ICD-10-CM | POA: Diagnosis not present

## 2021-08-05 DIAGNOSIS — R200 Anesthesia of skin: Secondary | ICD-10-CM | POA: Diagnosis not present

## 2021-08-05 DIAGNOSIS — R202 Paresthesia of skin: Secondary | ICD-10-CM | POA: Diagnosis not present

## 2021-08-05 DIAGNOSIS — R531 Weakness: Secondary | ICD-10-CM | POA: Diagnosis not present

## 2021-08-05 DIAGNOSIS — Z8673 Personal history of transient ischemic attack (TIA), and cerebral infarction without residual deficits: Secondary | ICD-10-CM | POA: Diagnosis not present

## 2021-08-05 DIAGNOSIS — R5381 Other malaise: Secondary | ICD-10-CM | POA: Diagnosis not present

## 2021-08-05 DIAGNOSIS — M545 Low back pain, unspecified: Secondary | ICD-10-CM | POA: Diagnosis not present

## 2021-08-05 DIAGNOSIS — R5383 Other fatigue: Secondary | ICD-10-CM | POA: Diagnosis not present

## 2021-08-09 NOTE — Telephone Encounter (Signed)
Left message for pt to call back and schedule a tele pre op appt.  

## 2021-08-10 ENCOUNTER — Telehealth: Payer: Self-pay | Admitting: *Deleted

## 2021-08-10 NOTE — Telephone Encounter (Signed)
I s/w the pt today and she is agreeable to plan of care for tele pre op appt 08/13/21@ 2 pm. Med rec and consent are done. Pt tells me the procedure has been pushed out to 09/28/21.     Patient Consent for Virtual Visit        Hannah Kirby has provided verbal consent on 08/10/2021 for a virtual visit (video or telephone).   CONSENT FOR VIRTUAL VISIT FOR:  Hannah Kirby  By participating in this virtual visit I agree to the following:  I hereby voluntarily request, consent and authorize [facility] and its employed or contracted physicians, Engineer, materials, nurse practitioners or other licensed health care professionals (the Practitioner), to provide me with telemedicine health care services (the "Services") as deemed necessary by the treating Practitioner. I acknowledge and consent to receive the Services by the Practitioner via telemedicine. I understand that the telemedicine visit will involve communicating with the Practitioner through live audiovisual communication technology and the disclosure of certain medical information by electronic transmission. I acknowledge that I have been given the opportunity to request an in-person assessment or other available alternative prior to the telemedicine visit and am voluntarily participating in the telemedicine visit.  I understand that I have the right to withhold or withdraw my consent to the use of telemedicine in the course of my care at any time, without affecting my right to future care or treatment, and that the Practitioner or I may terminate the telemedicine visit at any time. I understand that I have the right to inspect all information obtained and/or recorded in the course of the telemedicine visit and may receive copies of available information for a reasonable fee.  I understand that some of the potential risks of receiving the Services via telemedicine include:  Delay or interruption in medical evaluation due to technological  equipment failure or disruption; Information transmitted may not be sufficient (e.g. poor resolution of images) to allow for appropriate medical decision making by the Practitioner; and/or  In rare instances, security protocols could fail, causing a breach of personal health information.  Furthermore, I acknowledge that it is my responsibility to provide information about my medical history, conditions and care that is complete and accurate to the best of my ability. I acknowledge that Practitioner's advice, recommendations, and/or decision may be based on factors not within their control, such as incomplete or inaccurate data provided by me or distortions of diagnostic images or specimens that may result from electronic transmissions. I understand that the practice of medicine is not an exact science and that Practitioner makes no warranties or guarantees regarding treatment outcomes. I acknowledge that a copy of this consent can be made available to me via my patient portal ([place]), or I can request a printed copy by calling the office of [facility].    I understand that my insurance will be billed for this visit.   I have read or had this consent read to me. I understand the contents of this consent, which adequately explains the benefits and risks of the Services being provided via telemedicine.  I have been provided ample opportunity to ask questions regarding this consent and the Services and have had my questions answered to my satisfaction. I give my informed consent for the services to be provided through the use of telemedicine in my medical care

## 2021-08-10 NOTE — Telephone Encounter (Signed)
I s/w the pt today and she is agreeable to plan of care for tele pre op appt 08/13/21@ 2 pm. Med rec and consent are done. Pt tells me the procedure has been pushed out to 09/28/21.     Patient Consent for Virtual Visit        Ninoska Goswick Friedly has provided verbal consent on 08/10/2021 for a virtual visit (video or telephone).   CONSENT FOR VIRTUAL VISIT FOR:  Roland Earl Gardin  By participating in this virtual visit I agree to the following:  I hereby voluntarily request, consent and authorize [facility] and its employed or contracted physicians, Engineer, materials, nurse practitioners or other licensed health care professionals (the Practitioner), to provide me with telemedicine health care services (the "Services") as deemed necessary by the treating Practitioner. I acknowledge and consent to receive the Services by the Practitioner via telemedicine. I understand that the telemedicine visit will involve communicating with the Practitioner through live audiovisual communication technology and the disclosure of certain medical information by electronic transmission. I acknowledge that I have been given the opportunity to request an in-person assessment or other available alternative prior to the telemedicine visit and am voluntarily participating in the telemedicine visit.  I understand that I have the right to withhold or withdraw my consent to the use of telemedicine in the course of my care at any time, without affecting my right to future care or treatment, and that the Practitioner or I may terminate the telemedicine visit at any time. I understand that I have the right to inspect all information obtained and/or recorded in the course of the telemedicine visit and may receive copies of available information for a reasonable fee.  I understand that some of the potential risks of receiving the Services via telemedicine include:  Delay or interruption in medical evaluation due to technological  equipment failure or disruption; Information transmitted may not be sufficient (e.g. poor resolution of images) to allow for appropriate medical decision making by the Practitioner; and/or  In rare instances, security protocols could fail, causing a breach of personal health information.  Furthermore, I acknowledge that it is my responsibility to provide information about my medical history, conditions and care that is complete and accurate to the best of my ability. I acknowledge that Practitioner's advice, recommendations, and/or decision may be based on factors not within their control, such as incomplete or inaccurate data provided by me or distortions of diagnostic images or specimens that may result from electronic transmissions. I understand that the practice of medicine is not an exact science and that Practitioner makes no warranties or guarantees regarding treatment outcomes. I acknowledge that a copy of this consent can be made available to me via my patient portal ([place]), or I can request a printed copy by calling the office of [facility].    I understand that my insurance will be billed for this visit.   I have read or had this consent read to me. I understand the contents of this consent, which adequately explains the benefits and risks of the Services being provided via telemedicine.  I have been provided ample opportunity to ask questions regarding this consent and the Services and have had my questions answered to my satisfaction. I give my informed consent for the services to be provided through the use of telemedicine in my medical care

## 2021-08-11 DIAGNOSIS — N1832 Chronic kidney disease, stage 3b: Secondary | ICD-10-CM | POA: Diagnosis not present

## 2021-08-11 DIAGNOSIS — E1122 Type 2 diabetes mellitus with diabetic chronic kidney disease: Secondary | ICD-10-CM | POA: Diagnosis not present

## 2021-08-11 DIAGNOSIS — I129 Hypertensive chronic kidney disease with stage 1 through stage 4 chronic kidney disease, or unspecified chronic kidney disease: Secondary | ICD-10-CM | POA: Diagnosis not present

## 2021-08-13 ENCOUNTER — Ambulatory Visit (INDEPENDENT_AMBULATORY_CARE_PROVIDER_SITE_OTHER): Payer: Medicare Other | Admitting: Physician Assistant

## 2021-08-13 DIAGNOSIS — Z01810 Encounter for preprocedural cardiovascular examination: Secondary | ICD-10-CM

## 2021-08-16 ENCOUNTER — Telehealth: Payer: Self-pay

## 2021-08-16 DIAGNOSIS — I10 Essential (primary) hypertension: Secondary | ICD-10-CM | POA: Diagnosis not present

## 2021-08-16 DIAGNOSIS — I129 Hypertensive chronic kidney disease with stage 1 through stage 4 chronic kidney disease, or unspecified chronic kidney disease: Secondary | ICD-10-CM | POA: Diagnosis not present

## 2021-08-16 DIAGNOSIS — R809 Proteinuria, unspecified: Secondary | ICD-10-CM | POA: Diagnosis not present

## 2021-08-16 DIAGNOSIS — N1832 Chronic kidney disease, stage 3b: Secondary | ICD-10-CM | POA: Diagnosis not present

## 2021-08-16 DIAGNOSIS — E1122 Type 2 diabetes mellitus with diabetic chronic kidney disease: Secondary | ICD-10-CM | POA: Diagnosis not present

## 2021-08-17 LAB — PROTEIN / CREATININE RATIO, URINE: Prot/Creat Ratio, Ur: 516

## 2021-08-20 ENCOUNTER — Telehealth: Payer: Medicare Other

## 2021-08-25 ENCOUNTER — Encounter: Payer: Self-pay | Admitting: Family Medicine

## 2021-08-25 DIAGNOSIS — M48061 Spinal stenosis, lumbar region without neurogenic claudication: Secondary | ICD-10-CM

## 2021-08-25 MED ORDER — HYDROCODONE-ACETAMINOPHEN 5-325 MG PO TABS: 1.0000 | ORAL_TABLET | Freq: Four times a day (QID) | ORAL | 0 refills | Status: DC | PRN

## 2021-08-25 NOTE — Telephone Encounter (Signed)
Last refill: 06/15/2021 # 120, with 0 refills  Last office visit: 04/12/2021 No future office visit scheduled with Dr. Caryn Section.

## 2021-08-26 DIAGNOSIS — M75101 Unspecified rotator cuff tear or rupture of right shoulder, not specified as traumatic: Secondary | ICD-10-CM | POA: Diagnosis not present

## 2021-08-26 DIAGNOSIS — M19111 Post-traumatic osteoarthritis, right shoulder: Secondary | ICD-10-CM | POA: Diagnosis not present

## 2021-08-27 ENCOUNTER — Telehealth: Payer: Medicare Other

## 2021-09-03 ENCOUNTER — Ambulatory Visit: Payer: Medicare Other

## 2021-09-03 DIAGNOSIS — J449 Chronic obstructive pulmonary disease, unspecified: Secondary | ICD-10-CM

## 2021-09-03 DIAGNOSIS — Z794 Long term (current) use of insulin: Secondary | ICD-10-CM

## 2021-09-03 NOTE — Chronic Care Management (AMB) (Signed)
Care Management    RN Visit Note  09/03/2021 Name: Hannah Kirby MRN: [id number] DOB: September 29, 1945  Subjective: Hannah Kirby is a 76 y.o. year old female who is a primary care patient of Caryn Section, Kirstie Peri, MD. The care management team was consulted for assistance with disease management and care coordination needs.    Engaged with patient by telephone for follow up visit in response to provider referral for case management and care coordination services.   Consent to Services:   Ms. Haubner was given information about Care Management services including:  Care Management services includes personalized support from designated clinical staff supervised by her physician, including individualized plan of care and coordination with other care providers 24/7 contact phone numbers for assistance for urgent and routine care needs. The patient may stop case management services at any time by phone call to the office staff.  Patient agreed to services and consent obtained.   Assessment: Review of patient past medical history, allergies, medications, health status, including review of consultants reports, laboratory and other test data, was performed as part of comprehensive evaluation and provision of chronic care management services.   SDOH (Social Determinants of Health) assessments and interventions performed: No  Care Plan  Allergies  Allergen Reactions   Spironolactone Shortness Of Breath   Sulfonamide Derivatives Other (See Comments)    Last taken as a child; made her mouth break out   Other Other (See Comments)    Mouth blisters   Sulfa Antibiotics Other (See Comments)    Mouth blisters Welts on mouth    Codeine Nausea And Vomiting and Other (See Comments)    Nausea   Prednisone Palpitations and Other (See Comments)    Made her legs "feel weird."    Outpatient Encounter Medications as of 09/03/2021  Medication Sig   acetaminophen (TYLENOL) 500 MG tablet Take 500 mg by  mouth every 6 (six) hours as needed.   albuterol (VENTOLIN HFA) 108 (90 Base) MCG/ACT inhaler Inhale 2 puffs into the lungs every 4 (four) hours as needed for wheezing or shortness of breath.   aspirin (ASPIR-81) 81 MG EC tablet Take 81 mg by mouth daily.   clopidogrel (PLAVIX) 75 MG tablet Take 1 tablet (75 mg total) by mouth daily.   cyanocobalamin 1000 MCG tablet Take 1,000 mcg by mouth daily.   ezetimibe (ZETIA) 10 MG tablet Take 1 tablet (10 mg total) by mouth daily.   fluticasone furoate-vilanterol (BREO ELLIPTA) 100-25 MCG/ACT AEPB Inhale 1 puff into the lungs daily.   HYDROcodone-acetaminophen (NORCO/VICODIN) 5-325 MG tablet Take 1 tablet by mouth every 6 (six) hours as needed.   insulin glargine (LANTUS) 100 UNIT/ML injection Inject 22 Units into the skin daily.    lisinopril (ZESTRIL) 20 MG tablet Take 1 tablet (20 mg total) by mouth daily.   metoprolol tartrate (LOPRESSOR) 100 MG tablet Take 1 tablet (100 mg total) by mouth 2 (two) times daily.   Multiple Vitamins-Minerals (DAILY MULTI) TABS Take 1 tablet by mouth daily.   nitroGLYCERIN (NITROSTAT) 0.4 MG SL tablet Place 1 tablet (0.4 mg total) under the tongue every 5 (five) minutes as needed.   Omega-3 Fatty Acids (FISH OIL) 1000 MG CAPS Take 2 capsules by mouth in the morning and at bedtime.    ondansetron (ZOFRAN ODT) 4 MG disintegrating tablet Take 1 tablet (4 mg total) by mouth every 8 (eight) hours as needed for nausea or vomiting.   RABEprazole (ACIPHEX) 20 MG tablet Take 1 tablet (20  mg total) by mouth daily. 15 Mins. before evening meal   rosuvastatin (CRESTOR) 20 MG tablet Take 1 tablet (20 mg total) by mouth daily.   Semaglutide,0.25 or 0.'5MG'$ /DOS, 2 MG/1.5ML SOPN Inject 0.5 mg into the skin once a week.   sucralfate (CARAFATE) 1 g tablet Take 1 tablet (1 g total) by mouth 2 (two) times daily. 15 Minutes before evening meal and at bed time   tiotropium (SPIRIVA) 18 MCG inhalation capsule Place 1 capsule (18 mcg total) into  inhaler and inhale daily.   No facility-administered encounter medications on file as of 09/03/2021.    Patient Active Problem List   Diagnosis Date Noted   Restrictive airway disease 07/21/2020   Partial thickness rotator cuff tear 03/20/2020   Shoulder pain 03/05/2020   Retinal detachment, left 03/04/2020   Ataxia 02/28/2020   Difficulty walking 02/28/2020   Physical deconditioning 02/28/2020   Chronic diastolic congestive heart failure ([place]) 11/20/2019   Polyp of ascending colon    Acute hemorrhagic colitis 08/08/2019   Type II diabetes mellitus with renal manifestations ([place]) 08/08/2019   Hypokalemia 08/08/2019   TIA (transient ischemic attack) 08/01/2019   Benign hypertensive kidney disease with chronic kidney disease 04/25/2019   Type 2 diabetes mellitus with diabetic chronic kidney disease ([place]) 04/25/2019   Secondary hyperparathyroidism of renal origin ([place]) 10/24/2018   Chronic, continuous use of opioids 03/26/2018   History of adenomatous polyp of colon 01/22/2018   Ulceration of colon determined by endoscopy 01/22/2018   Diverticulosis of colon 01/22/2018   History of CVA (cerebrovascular accident) 11/08/2016   Weakness of left upper extremity 10/30/2016   AAA (abdominal aortic aneurysm) without rupture ([place]) 08/12/2016   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   Stage 3b chronic kidney disease ([place]) 07/27/2015   Chest pain at rest 05/06/2015   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[place]) 10/28/2014   Solitary pulmonary nodule on lung CT 08/20/2014   Allergic rhinitis 07/28/2014   CAFL (chronic airflow limitation) ([place]) 07/28/2014   Acid reflux 07/28/2014   Obesity    Granuloma annulare    Back pain 11/12/2013   Scoliosis 10/30/2013   Lumbar radiculopathy 10/30/2013   Lumbar canal stenosis 10/30/2013   Calcium blood increased 10/22/2013   Proteinuria 10/22/2013   Tobacco use disorder 03/21/2011   Obese 03/21/2011   Edema 08/18/2010   Familial multiple  lipoprotein-type hyperlipidemia 03/25/2009   Coronary artery disease 03/25/2009   Essential hypertension 03/25/2009    Patient Care Plan: RN Care Management Plan of Care     Problem Identified: CAL, DM, HTN and HLD      Long-Range Goal: Disease Progression Prevented or Minimized   Priority: High  Note:     Current Barriers:  Chronic Disease Management support and education needs related to Chronic Airflow Limitation, HTN, HLD, and DMII.  RNCM Clinical Goal(s):  Patient will demonstrate Ongoing adherence to prescribed treatment plan for  Chronic Airflow Limitation, HTN, HLD, and DM through collaboration with the provider,  RN Care Manager and the care team.  Interventions: 1:1 collaboration with primary care provider regarding development and update of comprehensive plan of care as evidenced by provider attestation and co-signature Inter-disciplinary care team collaboration (see longitudinal plan of care) Evaluation of current treatment plan related to  self management and patient's adherence to plan as established by provider   Diabetes Interventions:  Reviewed plan for diabetes management. Patient remains compliant with plan. Last A1C was 6.5%. At goal of less than 7%.  Patient doing well with self-management.  Scheduled for follow up with the Endocrinology team on 09/10/21.   Hyperlipidemia Interventions:  Reviewed plan for management of hyperlipidemia. Reports excellent compliance with medications. Advised to notify provider if unable to tolerate prescribed regimen.  Reviewed importance of completing labs as prescribed. Discussed nutritional intake. Advised to continue readings nutrition labels and limit foods high in cholesterol.   Hypertension Interventions:   Reviewed plan for hypertension management. Remains compliant with medication and blood pressure monitoring. Reports BP readings have been within established range. Advised to continue monitoring and maintain a log.  Reviewed indications for notifying a provider. Reviewed s/sx of heart attack, stroke and worsening symptoms that require immediate medical attention.   Chronic Airflow Limitation Interventions: Reviewed current treatment plan. Reports using inhalers as prescribed. Reports using supplemental O2 at 2L/min when needed as advised. Denies changes or decline in activity tolerance. Overall reports symptoms have been well controlled. Reviewed importance of daily assessment. Verbalized awareness of need to notify a provider if experiencing moderate symptoms for greater than 48 hours without improvement. Reviewed worsening symptoms that require immediate medical attention.   Patient Goals/Self-Care Activities: Take all medications as prescribed Attend all scheduled provider appointments Call pharmacy for medication refills 3-7 days in advance of running out of medications Perform all self care activities independently  Call provider office for new concerns or questions        PLAN:  Mrs. Wooldridge will call if she requires additional outreach. The care team will gladly assist.   Horris Latino [facility] Care Management [phone]

## 2021-09-03 NOTE — Patient Instructions (Signed)
Thank you for allowing the  Care Management team to participate in your care.  It was great speaking with you today! Please do not hesitate to call if you require additional outreach.   [facility] Care Management [phone]

## 2021-09-10 DIAGNOSIS — E1121 Type 2 diabetes mellitus with diabetic nephropathy: Secondary | ICD-10-CM | POA: Diagnosis not present

## 2021-09-10 DIAGNOSIS — E1169 Type 2 diabetes mellitus with other specified complication: Secondary | ICD-10-CM | POA: Diagnosis not present

## 2021-09-10 DIAGNOSIS — I152 Hypertension secondary to endocrine disorders: Secondary | ICD-10-CM | POA: Diagnosis not present

## 2021-09-10 DIAGNOSIS — Z794 Long term (current) use of insulin: Secondary | ICD-10-CM | POA: Diagnosis not present

## 2021-09-10 DIAGNOSIS — E785 Hyperlipidemia, unspecified: Secondary | ICD-10-CM | POA: Diagnosis not present

## 2021-09-10 DIAGNOSIS — E1159 Type 2 diabetes mellitus with other circulatory complications: Secondary | ICD-10-CM | POA: Diagnosis not present

## 2021-09-10 DIAGNOSIS — F17200 Nicotine dependence, unspecified, uncomplicated: Secondary | ICD-10-CM | POA: Diagnosis not present

## 2021-09-27 ENCOUNTER — Telehealth: Payer: Self-pay | Admitting: Gastroenterology

## 2021-09-27 NOTE — Telephone Encounter (Signed)
Patient left vm stating she is having some congestion due to bronchitis. Patient is wondering if she needs to postpone her procedure. Please return pt call.

## 2021-09-27 NOTE — Telephone Encounter (Signed)
Pt reports URI Sx with congestion... procedure r/s

## 2021-09-29 ENCOUNTER — Other Ambulatory Visit: Payer: Self-pay | Admitting: Family Medicine

## 2021-09-29 DIAGNOSIS — M48061 Spinal stenosis, lumbar region without neurogenic claudication: Secondary | ICD-10-CM

## 2021-09-29 MED ORDER — HYDROCODONE-ACETAMINOPHEN 5-325 MG PO TABS: 1.0000 | ORAL_TABLET | Freq: Four times a day (QID) | ORAL | 0 refills | Status: DC | PRN

## 2021-10-08 ENCOUNTER — Ambulatory Visit: Payer: Self-pay

## 2021-10-08 NOTE — Chronic Care Management (AMB) (Signed)
   10/08/2021  [name] Jun 15, 1945 [id number]   Documentation encounter created to complete case transition. The care management team will continue to follow [name] for care coordination.   [place] Management [phone]

## 2021-11-01 ENCOUNTER — Other Ambulatory Visit: Payer: Self-pay | Admitting: Family Medicine

## 2021-11-01 DIAGNOSIS — M48061 Spinal stenosis, lumbar region without neurogenic claudication: Secondary | ICD-10-CM

## 2021-11-02 MED ORDER — HYDROCODONE-ACETAMINOPHEN 5-325 MG PO TABS: 1.0000 | ORAL_TABLET | Freq: Four times a day (QID) | ORAL | 0 refills | Status: DC | PRN

## 2021-11-03 ENCOUNTER — Telehealth: Payer: Self-pay

## 2021-11-03 NOTE — Telephone Encounter (Signed)
No, the ones she has had are good for the rest of her life.

## 2021-11-03 NOTE — Telephone Encounter (Signed)
Copied from [place] [id number]. Topic: General - Other >> Nov 03, 2021  8:12 AM [name] wrote: Pt inquiring if it is time for her to have a pneumonia vaccine  Please fu w/ pt

## 2021-11-03 NOTE — Telephone Encounter (Signed)
Per chart:  Pneumovax 23 was given 11/02/2011, at age 76 . Prevnar 13 was given 05/22/2013, at age 53.   Please advise wether patient needs anymore pneumonia vaccines.

## 2021-11-04 NOTE — Telephone Encounter (Signed)
Spoke with patient and advised

## 2021-11-09 ENCOUNTER — Ambulatory Visit: Admission: RE | Admit: 2021-11-09 | Payer: Medicare Other | Source: Home / Self Care | Admitting: Gastroenterology

## 2021-11-09 ENCOUNTER — Encounter: Admission: RE | Payer: Self-pay | Source: Home / Self Care

## 2021-11-09 SURGERY — ESOPHAGOGASTRODUODENOSCOPY (EGD) WITH PROPOFOL
Anesthesia: General

## 2021-11-16 ENCOUNTER — Ambulatory Visit (INDEPENDENT_AMBULATORY_CARE_PROVIDER_SITE_OTHER): Payer: Medicare Other | Admitting: Family Medicine

## 2021-11-16 ENCOUNTER — Encounter: Payer: Self-pay | Admitting: Family Medicine

## 2021-11-16 DIAGNOSIS — I2510 Atherosclerotic heart disease of native coronary artery without angina pectoris: Secondary | ICD-10-CM

## 2021-11-16 DIAGNOSIS — Z23 Encounter for immunization: Secondary | ICD-10-CM | POA: Diagnosis not present

## 2021-11-16 NOTE — Progress Notes (Signed)
Nurse Visit. Patient here for influenza vaccine .

## 2021-11-17 ENCOUNTER — Ambulatory Visit: Payer: Medicare Other

## 2021-11-24 DIAGNOSIS — G5601 Carpal tunnel syndrome, right upper limb: Secondary | ICD-10-CM | POA: Diagnosis not present

## 2021-11-24 DIAGNOSIS — M75101 Unspecified rotator cuff tear or rupture of right shoulder, not specified as traumatic: Secondary | ICD-10-CM | POA: Diagnosis not present

## 2021-11-30 ENCOUNTER — Other Ambulatory Visit: Payer: Self-pay | Admitting: Family Medicine

## 2021-11-30 DIAGNOSIS — M48061 Spinal stenosis, lumbar region without neurogenic claudication: Secondary | ICD-10-CM

## 2021-11-30 MED ORDER — HYDROCODONE-ACETAMINOPHEN 5-325 MG PO TABS: 1.0000 | ORAL_TABLET | Freq: Four times a day (QID) | ORAL | 0 refills | Status: DC | PRN

## 2021-12-15 ENCOUNTER — Encounter: Payer: Self-pay | Admitting: Family Medicine

## 2021-12-15 ENCOUNTER — Encounter: Payer: Self-pay | Admitting: Cardiovascular Disease

## 2021-12-15 MED ORDER — EZETIMIBE 10 MG PO TABS: 10.0000 mg | ORAL_TABLET | Freq: Every day | ORAL | 0 refills | Status: DC

## 2021-12-15 MED ORDER — METOPROLOL TARTRATE 100 MG PO TABS: 100.0000 mg | ORAL_TABLET | Freq: Two times a day (BID) | ORAL | 0 refills | Status: DC

## 2021-12-16 ENCOUNTER — Other Ambulatory Visit: Payer: Self-pay

## 2021-12-16 DIAGNOSIS — J449 Chronic obstructive pulmonary disease, unspecified: Secondary | ICD-10-CM

## 2021-12-16 MED ORDER — FLUTICASONE FUROATE-VILANTEROL 100-25 MCG/ACT IN AEPB: 1.0000 | INHALATION_SPRAY | Freq: Every day | RESPIRATORY_TRACT | 3 refills | Status: DC

## 2021-12-16 MED ORDER — ALBUTEROL SULFATE HFA 108 (90 BASE) MCG/ACT IN AERS: 2.0000 | INHALATION_SPRAY | RESPIRATORY_TRACT | 3 refills | Status: DC | PRN

## 2021-12-16 MED ORDER — TIOTROPIUM BROMIDE MONOHYDRATE 18 MCG IN CAPS: 18.0000 ug | ORAL_CAPSULE | Freq: Every day | RESPIRATORY_TRACT | 4 refills | Status: DC

## 2021-12-20 ENCOUNTER — Encounter (INDEPENDENT_AMBULATORY_CARE_PROVIDER_SITE_OTHER): Payer: Self-pay

## 2021-12-21 DIAGNOSIS — N1832 Chronic kidney disease, stage 3b: Secondary | ICD-10-CM | POA: Diagnosis not present

## 2021-12-21 DIAGNOSIS — E1122 Type 2 diabetes mellitus with diabetic chronic kidney disease: Secondary | ICD-10-CM | POA: Diagnosis not present

## 2021-12-21 DIAGNOSIS — I10 Essential (primary) hypertension: Secondary | ICD-10-CM | POA: Diagnosis not present

## 2021-12-30 ENCOUNTER — Other Ambulatory Visit: Payer: Self-pay | Admitting: Family Medicine

## 2021-12-30 DIAGNOSIS — M48061 Spinal stenosis, lumbar region without neurogenic claudication: Secondary | ICD-10-CM

## 2021-12-31 MED ORDER — HYDROCODONE-ACETAMINOPHEN 5-325 MG PO TABS: 1.0000 | ORAL_TABLET | Freq: Four times a day (QID) | ORAL | 0 refills | Status: DC | PRN

## 2022-01-31 ENCOUNTER — Other Ambulatory Visit: Payer: Self-pay | Admitting: Family Medicine

## 2022-01-31 ENCOUNTER — Encounter: Payer: Self-pay | Admitting: Gastroenterology

## 2022-01-31 DIAGNOSIS — M48061 Spinal stenosis, lumbar region without neurogenic claudication: Secondary | ICD-10-CM

## 2022-01-31 MED ORDER — HYDROCODONE-ACETAMINOPHEN 5-325 MG PO TABS: 1.0000 | ORAL_TABLET | Freq: Four times a day (QID) | ORAL | 0 refills | Status: DC | PRN

## 2022-02-04 DIAGNOSIS — M545 Low back pain, unspecified: Secondary | ICD-10-CM | POA: Diagnosis not present

## 2022-02-04 DIAGNOSIS — R200 Anesthesia of skin: Secondary | ICD-10-CM | POA: Diagnosis not present

## 2022-02-04 DIAGNOSIS — Z8673 Personal history of transient ischemic attack (TIA), and cerebral infarction without residual deficits: Secondary | ICD-10-CM | POA: Diagnosis not present

## 2022-02-04 DIAGNOSIS — R270 Ataxia, unspecified: Secondary | ICD-10-CM | POA: Diagnosis not present

## 2022-02-04 DIAGNOSIS — R519 Headache, unspecified: Secondary | ICD-10-CM | POA: Diagnosis not present

## 2022-02-04 DIAGNOSIS — R202 Paresthesia of skin: Secondary | ICD-10-CM | POA: Diagnosis not present

## 2022-02-04 DIAGNOSIS — R531 Weakness: Secondary | ICD-10-CM | POA: Diagnosis not present

## 2022-02-10 ENCOUNTER — Other Ambulatory Visit (INDEPENDENT_AMBULATORY_CARE_PROVIDER_SITE_OTHER): Payer: Self-pay | Admitting: Vascular Surgery

## 2022-02-10 DIAGNOSIS — I7143 Infrarenal abdominal aortic aneurysm, without rupture: Secondary | ICD-10-CM

## 2022-02-11 ENCOUNTER — Ambulatory Visit (INDEPENDENT_AMBULATORY_CARE_PROVIDER_SITE_OTHER): Payer: Medicare Other | Admitting: Nurse Practitioner

## 2022-02-11 ENCOUNTER — Ambulatory Visit (INDEPENDENT_AMBULATORY_CARE_PROVIDER_SITE_OTHER): Payer: Medicare Other

## 2022-02-11 ENCOUNTER — Encounter (INDEPENDENT_AMBULATORY_CARE_PROVIDER_SITE_OTHER): Payer: Self-pay | Admitting: Vascular Surgery

## 2022-02-11 VITALS — BP 162/95 | HR 72 | Resp 17 | Ht 65.0 in | Wt 159.0 lb

## 2022-02-11 DIAGNOSIS — I10 Essential (primary) hypertension: Secondary | ICD-10-CM | POA: Diagnosis not present

## 2022-02-11 DIAGNOSIS — E0821 Diabetes mellitus due to underlying condition with diabetic nephropathy: Secondary | ICD-10-CM | POA: Diagnosis not present

## 2022-02-11 DIAGNOSIS — F17200 Nicotine dependence, unspecified, uncomplicated: Secondary | ICD-10-CM

## 2022-02-11 DIAGNOSIS — I7143 Infrarenal abdominal aortic aneurysm, without rupture: Secondary | ICD-10-CM

## 2022-02-11 DIAGNOSIS — I2510 Atherosclerotic heart disease of native coronary artery without angina pectoris: Secondary | ICD-10-CM

## 2022-02-27 ENCOUNTER — Encounter (INDEPENDENT_AMBULATORY_CARE_PROVIDER_SITE_OTHER): Payer: Self-pay | Admitting: Nurse Practitioner

## 2022-02-27 NOTE — Progress Notes (Signed)
Subjective:    Patient ID: Hannah Kirby, female    DOB: 01-22-1946, 77 y.o.   MRN: [id number] Chief Complaint  Patient presents with   Follow-up    ultrasound    The patient returns to the office for surveillance of a known abdominal aortic aneurysm. Patient denies abdominal pain or back pain, no other abdominal complaints. No changes suggesting embolic episodes.   There have been no interval changes in the patient's overall health care since his last visit.  Patient denies amaurosis fugax or TIA symptoms. There is no history of claudication or rest pain symptoms of the lower extremities. The patient denies angina or shortness of breath.   Duplex US of the aorta and iliac arteries shows an AAA measured 3.85 cm.    Review of Systems  All other systems reviewed and are negative.      Objective:   Physical Exam Vitals reviewed.  HENT:     Head: Normocephalic.  Cardiovascular:     Rate and Rhythm: Normal rate.     Pulses: Normal pulses.  Pulmonary:     Effort: Pulmonary effort is normal.  Skin:    General: Skin is warm and dry.  Neurological:     Mental Status: She is alert and oriented to person, place, and time.  Psychiatric:        Mood and Affect: Mood normal.        Behavior: Behavior normal.        Thought Content: Thought content normal.        Judgment: Judgment normal.     BP (!) 162/95 (BP Location: Left Arm)   Pulse 72   Resp 17   Ht '5\' 5"'$  (1.651 m)   Wt 159 lb (72.1 kg)   BMI 26.46 kg/m   Past Medical History:  Diagnosis Date   Anemia    Anxiety    Arthritis    C. difficile colitis 08/09/2019   CAD (coronary artery disease)    a. 02/2006 PCI: BMS x 2 to RCA, cath o/w without significant coronary disease; b. nuclear stress test 07/2014: No ischemia/infarct; c. 11/2017 MV: no isch/infarct, EF 55-65%; d. 03/2020 NSTEMI/PCI: LM nl, LAD min irregs, RI 25, small, LCX nl, RCA 30p/m ISR, 99d (3.0x15 Resolute Hannah DES).   Chronic bronchitis (HCC)     secondary to cigarette smoking   Chronic kidney disease (CKD), stage III (moderate) (HCC)    COPD (chronic obstructive pulmonary disease) (HCC)    Diabetes mellitus    Diastolic dysfunction    a. 07/2019 Echo: EF 60-65%, no rwma, mod LVH. Nl RV size/fxn; b. 03/2020 Echo: EF 60-65%, mod LVH, gr1 DD, nl RV size/fxn, mild AS.   FHx: allergies    GERD (gastroesophageal reflux disease)    Goiter    Granulomatous disease ([place])    Hernia    Hyperlipidemia    Hypertension    Kidney stone on left side 2013   Microalbuminuria    NSTEMI (non-ST elevated myocardial infarction) ([place]) 03/23/2020   Obesity    Panic attacks    PVC's (premature ventricular contractions)    a. 03/2018 Zio: Occas PVCs (2.5%). Triggered events assoc w/ PVC/PAC.   Retinal tear 2020   Smokers' cough ([place])    Spinal stenosis    Stroke ([place]) 10/29/2016   mild left side weakness   Stroke ([place]) 08/01/2019   Tobacco abuse     Social History   Socioeconomic History   Marital status: Married  Spouse name: Marsia Cino   Number of children: 3   Years of education: Not on file   Highest education level: 12th grade  Occupational History   Occupation: retired  Tobacco Use   Smoking status: Every Day    Packs/day: 1.00    Years: 40.00    Total pack years: 40.00    Types: Cigarettes   Smokeless tobacco: Never  Vaping Use   Vaping Use: Former  Substance and Sexual Activity   Alcohol use: Yes    Comment: 1 glass of wine every few months on special occassions   Drug use: No   Sexual activity: Not on file  Other Topics Concern   Not on file  Social History Narrative   Retired. Married.  Lives in [place] with her husband.  She is not routinely exercising.   Social Determinants of Health   Financial Resource Strain: Low Risk  (03/08/2021)   Overall Financial Resource Strain (CARDIA)    Difficulty of Paying Living Expenses: Not hard at all  Food Insecurity: No Food Insecurity (03/08/2021)   Hunger Vital Sign     Worried About Running Out of Food in the Last Year: Never true    Ran Out of Food in the Last Year: Never true  Transportation Needs: No Transportation Needs (03/08/2021)   PRAPARE - Hydrologist (Medical): No    Lack of Transportation (Non-Medical): No  Physical Activity: Inactive (03/08/2021)   Exercise Vital Sign    Days of Exercise per Week: 0 days    Minutes of Exercise per Session: 0 min  Stress: No Stress Concern Present (03/08/2021)   [place]    Feeling of Stress : Not at all  Social Connections: Moderately Isolated (03/08/2021)   Social Connection and Isolation Panel [NHANES]    Frequency of Communication with Friends and Family: More than three times a week    Frequency of Social Gatherings with Friends and Family: Once a week    Attends Religious Services: Never    Marine scientist or Organizations: No    Attends Archivist Meetings: Never    Marital Status: Married  Human resources officer Violence: Not At Risk (03/08/2021)   Humiliation, Afraid, Rape, and Kick questionnaire    Fear of Current or Ex-Partner: No    Emotionally Abused: No    Physically Abused: No    Sexually Abused: No    Past Surgical History:  Procedure Laterality Date   ABDOMINAL HYSTERECTOMY     BREAST SURGERY     CATARACT EXTRACTION W/PHACO Right 03/01/2017   Procedure: CATARACT EXTRACTION PHACO AND INTRAOCULAR LENS PLACEMENT ([name]) RIGHT DIABETIC;  Surgeon: Leandrew Koyanagi, MD;  Location: [facility];  Service: Ophthalmology;  Laterality: Right;   CATARACT EXTRACTION W/PHACO Left 03/22/2017   Procedure: CATARACT EXTRACTION PHACO AND INTRAOCULAR LENS PLACEMENT ([place]) LEFT DIABETIC;  Surgeon: Leandrew Koyanagi, MD;  Location: [place];  Service: Ophthalmology;  Laterality: Left;  Diabetic - insulin and oral meds   COLONOSCOPY WITH PROPOFOL N/A 09/23/2014   Procedure:  COLONOSCOPY WITH PROPOFOL;  Surgeon: Lollie Sails, MD;  Location[facility] ENDOSCOPY;  Service: Endoscopy;  Laterality: N/A;   COLONOSCOPY WITH PROPOFOL N/A 01/11/2018   Procedure: COLONOSCOPY WITH PROPOFOL;  Surgeon: Lollie Sails, MD;  Location: [facility] ENDOSCOPY;  Service: Endoscopy;  Laterality: N/A;   COLONOSCOPY WITH PROPOFOL N/A 04/24/2018   Procedure: COLONOSCOPY WITH PROPOFOL;  Surgeon: Lollie Sails,  MD;  Location: [facility] ENDOSCOPY;  Service: Endoscopy;  Laterality: N/A;   COLONOSCOPY WITH PROPOFOL N/A 11/19/2019   Procedure: COLONOSCOPY WITH PROPOFOL;  Surgeon: Lucilla Lame, MD;  Location: [facility] ENDOSCOPY;  Service: Endoscopy;  Laterality: N/A;   CORONARY ANGIOPLASTY WITH STENT PLACEMENT  2008   CORONARY STENT INTERVENTION N/A 03/24/2020   Procedure: CORONARY STENT INTERVENTION;  Surgeon: Nelva Bush, MD;  Location: [place] CV LAB;  Service: Cardiovascular;  Laterality: N/A;   ESOPHAGOGASTRODUODENOSCOPY (EGD) WITH PROPOFOL N/A 12/30/2014   Procedure: ESOPHAGOGASTRODUODENOSCOPY (EGD) WITH PROPOFOL;  Surgeon: Lollie Sails, MD;  Location: [facility] ENDOSCOPY;  Service: Endoscopy;  Laterality: N/A;   ESOPHAGOGASTRODUODENOSCOPY (EGD) WITH PROPOFOL N/A 07/19/2016   Procedure: ESOPHAGOGASTRODUODENOSCOPY (EGD) WITH PROPOFOL;  Surgeon: Lollie Sails, MD;  Location: [facility] ENDOSCOPY;  Service: Endoscopy;  Laterality: N/A;   ESOPHAGOGASTRODUODENOSCOPY (EGD) WITH PROPOFOL N/A 04/24/2018   Procedure: ESOPHAGOGASTRODUODENOSCOPY (EGD) WITH PROPOFOL;  Surgeon: Lollie Sails, MD;  Location: [facility] ENDOSCOPY;  Service: Endoscopy;  Laterality: N/A;   hysterectomy (other)     LEFT HEART CATH AND CORONARY ANGIOGRAPHY N/A 03/24/2020   Procedure: LEFT HEART CATH AND CORONARY ANGIOGRAPHY;  Surgeon: Nelva Bush, MD;  Location: [place] CV LAB;  Service: Cardiovascular;  Laterality: N/A;    Family History  Problem Relation Age of Onset   Heart failure Mother    Stroke Mother     Diabetes Mother    Congestive Heart Failure Mother    Lung cancer Father    Breast cancer Maternal Aunt     Allergies  Allergen Reactions   Spironolactone Shortness Of Breath   Sulfonamide Derivatives Other (See Comments)    Last taken as a child; made her mouth break out   Other Other (See Comments)    Mouth blisters   Sulfa Antibiotics Other (See Comments)    Mouth blisters Welts on mouth    Codeine Nausea And Vomiting and Other (See Comments)    Nausea   Prednisone Palpitations and Other (See Comments)    Made her legs "feel weird."       Latest Ref Rng & Units 04/12/2021    9:50 AM 08/04/2020   11:27 AM 03/31/2020   10:34 AM  CBC  WBC 3.4 - 10.8 x10E3/uL 9.2  9.6  9.0   Hemoglobin 11.1 - 15.9 g/dL 15.9  16.9  16.4   Hematocrit 34.0 - 46.6 % 46.8  49.9  47.7   Platelets 150 - 450 x10E3/uL 223  225  232       CMP     Component Value Date/Time   NA 140 08/04/2020 1127   K 3.9 08/04/2020 1127   CL 101 08/04/2020 1127   CO2 24 08/04/2020 1127   GLUCOSE 101 (H) 08/04/2020 1127   GLUCOSE 152 (H) 03/25/2020 0354   BUN 22 08/04/2020 1127   CREATININE 1.28 (H) 08/04/2020 1127   CALCIUM 10.4 (H) 08/04/2020 1127   PROT 7.1 08/04/2020 1127   ALBUMIN 4.4 08/04/2020 1127   AST 20 08/04/2020 1127   ALT 19 08/04/2020 1127   ALKPHOS 125 (H) 08/04/2020 1127   BILITOT 0.3 08/04/2020 1127   GFRNONAA 47 (L) 03/31/2020 1034   GFRNONAA 57 (L) 03/25/2020 0354   GFRAA 55 (L) 03/31/2020 1034     No results found.     Assessment & Plan:   1. Infrarenal abdominal aortic aneurysm (AAA) without rupture (HCC) Recommend: No surgery or intervention is indicated at this time.  The patient has an asymptomatic abdominal aortic  aneurysm that is less than 4 cm in maximal diameter.    I have reviewed the natural history of abdominal aortic aneurysm and the small risk of rupture for aneurysm less than 5 cm in size.  However, as these small aneurysms tend to enlarge over time,  continued surveillance with ultrasound or CT scan is mandatory.   I have also discussed optimizing medical management with hypertension and lipid control and the importance of abstinence from tobacco.  The patient is also encouraged to exercise a minimum of 30 minutes 4 times a week.   Should the patient develop new onset abdominal or back pain or signs of peripheral embolization they are instructed to seek medical attention immediately and to alert the physician providing care that they have an aneurysm.   The patient voices their understanding.  The patient will return in 12 months with an aortic duplex. - VAS [place] AAA DUPLEX  2. Essential hypertension Continue antihypertensive medications as already ordered, these medications have been reviewed and there are no changes at this time.  3. Smoking Smoking cessation was discussed, 3-10 minutes spent on this topic specifically  4. Diabetes mellitus due to underlying condition with diabetic nephropathy, unspecified whether long term insulin use ([place]) Continue hypoglycemic medications as already ordered, these medications have been reviewed and there are no changes at this time.  Hgb A1C to be monitored as already arranged by primary service   Current Outpatient Medications on File Prior to Visit  Medication Sig Dispense Refill   acetaminophen (TYLENOL) 500 MG tablet Take 500 mg by mouth every 6 (six) hours as needed.     albuterol (VENTOLIN HFA) 108 (90 Base) MCG/ACT inhaler Inhale 2 puffs into the lungs every 4 (four) hours as needed for wheezing or shortness of breath. 48 g 3   aspirin (ASPIR-81) 81 MG EC tablet Take 81 mg by mouth daily.     clopidogrel (PLAVIX) 75 MG tablet Take 1 tablet (75 mg total) by mouth daily. 90 tablet 3   cyanocobalamin 1000 MCG tablet Take 1,000 mcg by mouth daily.     ezetimibe (ZETIA) 10 MG tablet Take 1 tablet (10 mg total) by mouth daily. 90 tablet 0   fluticasone furoate-vilanterol (BREO ELLIPTA) 100-25  MCG/ACT AEPB Inhale 1 puff into the lungs daily. 84 each 3   HYDROcodone-acetaminophen (NORCO/VICODIN) 5-325 MG tablet Take 1 tablet by mouth every 6 (six) hours as needed. 120 tablet 0   insulin glargine (LANTUS) 100 UNIT/ML injection Inject 22 Units into the skin daily.      lisinopril (ZESTRIL) 20 MG tablet Take 1 tablet (20 mg total) by mouth daily. 90 tablet 3   metoprolol tartrate (LOPRESSOR) 100 MG tablet Take 1 tablet (100 mg total) by mouth 2 (two) times daily. 180 tablet 0   Multiple Vitamins-Minerals (DAILY MULTI) TABS Take 1 tablet by mouth daily.     nitroGLYCERIN (NITROSTAT) 0.4 MG SL tablet Place 1 tablet (0.4 mg total) under the tongue every 5 (five) minutes as needed. 25 tablet 6   Omega-3 Fatty Acids (FISH OIL) 1000 MG CAPS Take 2 capsules by mouth in the morning and at bedtime.      ondansetron (ZOFRAN ODT) 4 MG disintegrating tablet Take 1 tablet (4 mg total) by mouth every 8 (eight) hours as needed for nausea or vomiting. 20 tablet 0   RABEprazole (ACIPHEX) 20 MG tablet Take 1 tablet (20 mg total) by mouth daily. 15 Mins. before evening meal 90 tablet 3   rosuvastatin (  CRESTOR) 20 MG tablet Take 1 tablet (20 mg total) by mouth daily. 90 tablet 3   Semaglutide,0.25 or 0.'5MG'$ /DOS, 2 MG/1.5ML SOPN Inject 0.5 mg into the skin once a week.     sucralfate (CARAFATE) 1 g tablet Take 1 tablet (1 g total) by mouth 2 (two) times daily. 15 Minutes before evening meal and at bed time 180 tablet 3   tiotropium (SPIRIVA) 18 MCG inhalation capsule Place 1 capsule (18 mcg total) into inhaler and inhale daily. 90 capsule 4   No current facility-administered medications on file prior to visit.    There are no Patient Instructions on file for this visit. No follow-ups on file.   Kris Hartmann, NP

## 2022-02-28 ENCOUNTER — Other Ambulatory Visit: Payer: Self-pay | Admitting: Family Medicine

## 2022-02-28 DIAGNOSIS — M48061 Spinal stenosis, lumbar region without neurogenic claudication: Secondary | ICD-10-CM

## 2022-03-01 MED ORDER — HYDROCODONE-ACETAMINOPHEN 5-325 MG PO TABS: 1.0000 | ORAL_TABLET | Freq: Four times a day (QID) | ORAL | 0 refills | Status: DC | PRN

## 2022-03-09 ENCOUNTER — Ambulatory Visit (INDEPENDENT_AMBULATORY_CARE_PROVIDER_SITE_OTHER): Payer: Medicare Other

## 2022-03-09 VITALS — Ht 65.0 in | Wt 159.0 lb

## 2022-03-09 DIAGNOSIS — Z0000 Encounter for general adult medical examination without abnormal findings: Secondary | ICD-10-CM

## 2022-03-09 NOTE — Progress Notes (Signed)
Virtual Visit via Telephone Note  I connected with  Hannah Kirby on 03/09/22 at  1:00 PM EST by telephone and verified that I am speaking with the correct person using two identifiers.  Location: Patient: home Provider: [facility] Persons participating in the virtual visit: [place]   I discussed the limitations, risks, security and privacy concerns of performing an evaluation and management service by telephone and the availability of in person appointments. The patient expressed understanding and agreed to proceed.  Interactive audio and video telecommunications were attempted between this nurse and patient, however failed, due to patient having technical difficulties OR patient did not have access to video capability.  We continued and completed visit with audio only.  Some vital signs may be absent or patient reported.   Dionisio David, LPN  Subjective:   Hannah Kirby is a 77 y.o. female who presents for Medicare Annual (Subsequent) preventive examination.  Review of Systems     Cardiac Risk Factors include: advanced age (>46mn, >>83women);diabetes mellitus;hypertension     Objective:    Today's Vitals   03/09/22 1304  PainSc: 5    There is no height or weight on file to calculate BMI.     03/09/2022    1:15 PM 03/08/2021    1:16 PM 03/24/2020    5:47 PM 03/23/2020    5:58 PM 02/18/2020    2:34 PM 11/19/2019    8:11 AM 08/07/2019   10:28 PM  Advanced Directives  Does Patient Have a Medical Advance Directive? No No  No Yes No No  Type of APersonnel officerLiving will    Copy of [place] Chart?     No - copy requested    Would patient like information on creating a medical advance directive? No - Patient declined Yes (ED - Information included in AVS) No - Patient declined  No - Patient declined  No - Patient declined    Current Medications (verified) Outpatient Encounter Medications as of  03/09/2022  Medication Sig   acetaminophen (TYLENOL) 500 MG tablet Take 500 mg by mouth every 6 (six) hours as needed.   albuterol (VENTOLIN HFA) 108 (90 Base) MCG/ACT inhaler Inhale 2 puffs into the lungs every 4 (four) hours as needed for wheezing or shortness of breath.   aspirin (ASPIR-81) 81 MG EC tablet Take 81 mg by mouth daily.   clopidogrel (PLAVIX) 75 MG tablet Take 1 tablet (75 mg total) by mouth daily.   cyanocobalamin 1000 MCG tablet Take 1,000 mcg by mouth daily.   ezetimibe (ZETIA) 10 MG tablet Take 1 tablet (10 mg total) by mouth daily.   fluticasone furoate-vilanterol (BREO ELLIPTA) 100-25 MCG/ACT AEPB Inhale 1 puff into the lungs daily.   HYDROcodone-acetaminophen (NORCO/VICODIN) 5-325 MG tablet Take 1 tablet by mouth every 6 (six) hours as needed.   insulin glargine (LANTUS) 100 UNIT/ML injection Inject 22 Units into the skin daily.    lisinopril (ZESTRIL) 20 MG tablet Take 1 tablet (20 mg total) by mouth daily.   metoprolol tartrate (LOPRESSOR) 100 MG tablet Take 1 tablet (100 mg total) by mouth 2 (two) times daily.   Multiple Vitamins-Minerals (DAILY MULTI) TABS Take 1 tablet by mouth daily.   nitroGLYCERIN (NITROSTAT) 0.4 MG SL tablet Place 1 tablet (0.4 mg total) under the tongue every 5 (five) minutes as needed.   Omega-3 Fatty Acids (FISH OIL) 1000 MG CAPS Take 2 capsules by mouth  in the morning and at bedtime.    ondansetron (ZOFRAN ODT) 4 MG disintegrating tablet Take 1 tablet (4 mg total) by mouth every 8 (eight) hours as needed for nausea or vomiting.   RABEprazole (ACIPHEX) 20 MG tablet Take 1 tablet (20 mg total) by mouth daily. 15 Mins. before evening meal   rosuvastatin (CRESTOR) 20 MG tablet Take 1 tablet (20 mg total) by mouth daily.   Semaglutide,0.25 or 0.'5MG'$ /DOS, 2 MG/1.5ML SOPN Inject 0.5 mg into the skin once a week.   sucralfate (CARAFATE) 1 g tablet Take 1 tablet (1 g total) by mouth 2 (two) times daily. 15 Minutes before evening meal and at bed time    tiotropium (SPIRIVA) 18 MCG inhalation capsule Place 1 capsule (18 mcg total) into inhaler and inhale daily.   No facility-administered encounter medications on file as of 03/09/2022.    Allergies (verified) Spironolactone, Sulfonamide derivatives, Other, Sulfa antibiotics, Codeine, and Prednisone   History: Past Medical History:  Diagnosis Date   Anemia    Anxiety    Arthritis    C. difficile colitis 08/09/2019   CAD (coronary artery disease)    a. 02/2006 PCI: BMS x 2 to RCA, cath o/w without significant coronary disease; b. nuclear stress test 07/2014: No ischemia/infarct; c. 11/2017 MV: no isch/infarct, EF 55-65%; d. 03/2020 NSTEMI/PCI: LM nl, LAD min irregs, RI 25, small, LCX nl, RCA 30p/m ISR, 99d (3.0x15 Resolute Onyx DES).   Chronic bronchitis (HCC)    secondary to cigarette smoking   Chronic kidney disease (CKD), stage III (moderate) (HCC)    COPD (chronic obstructive pulmonary disease) (HCC)    Diabetes mellitus    Diastolic dysfunction    a. 07/2019 Echo: EF 60-65%, no rwma, mod LVH. Nl RV size/fxn; b. 03/2020 Echo: EF 60-65%, mod LVH, gr1 DD, nl RV size/fxn, mild AS.   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 03/2018 [facility]: Occas PVCs (2.5%). Triggered events assoc w/ PVC/PAC.   Retinal tear 2020   Smokers' cough ([place])    Spinal stenosis    Stroke ([place]) 10/29/2016   mild left side weakness   Stroke ([place]) 08/01/2019   Tobacco abuse    Past Surgical History:  Procedure Laterality Date   ABDOMINAL HYSTERECTOMY     BREAST SURGERY     CATARACT EXTRACTION W/PHACO Right 03/01/2017   Procedure: CATARACT EXTRACTION PHACO AND INTRAOCULAR LENS PLACEMENT ([place]) RIGHT DIABETIC;   Surgeon: Leandrew Koyanagi, MD;  Location: [facility];  Service: Ophthalmology;  Laterality: Right;   CATARACT EXTRACTION W/PHACO Left 03/22/2017   Procedure: CATARACT EXTRACTION PHACO AND INTRAOCULAR LENS PLACEMENT ([place]) LEFT DIABETIC;  Surgeon: Leandrew Koyanagi, MD;  Location: [facility];  Service: Ophthalmology;  Laterality: Left;  Diabetic - insulin and oral meds   COLONOSCOPY WITH PROPOFOL N/A 09/23/2014   Procedure: COLONOSCOPY WITH PROPOFOL;  Surgeon: Lollie Sails, MD;  Location: [facility] ENDOSCOPY;  Service: Endoscopy;  Laterality: N/A;   COLONOSCOPY WITH PROPOFOL N/A 01/11/2018   Procedure: COLONOSCOPY WITH PROPOFOL;  Surgeon: Lollie Sails, MD;  Location[facility] END[facility]COPY;  Service: Endoscopy;  Laterality: N/A;   COLONOSCOPY WITH  PROPOFOL N/A 04/24/2018   Procedure: COLONOSCOPY WITH PROPOFOL;  Surgeon: Lollie Sails, MD;  Location: [facility] ENDOSCOPY;  Service: Endoscopy;  Laterality: N/A;   COLONOSCOPY WITH PROPOFOL N/A 11/19/2019   Procedure: COLONOSCOPY WITH PROPOFOL;  Surgeon: Lucilla Lame, MD;  Location: [facility] ENDOSCOPY;  Service: Endoscopy;  Laterality: N/A;   CORONARY ANGIOPLASTY WITH STENT PLACEMENT  2008   CORONARY STENT INTERVENTION N/A 03/24/2020   Procedure: CORONARY STENT INTERVENTION;  Surgeon: Nelva Bush, MD;  Location: [facility] CV LAB;  Service: Cardiovascular;  Laterality: N/A;   ESOPHAGOGASTRODUODENOSCOPY (EGD) WITH PROPOFOL N/A 12/30/2014   Procedure: ESOPHAGOGASTRODUODENOSCOPY (EGD) WITH PROPOFOL;  Surgeon: Lollie Sails, MD;  Location: [facility] ENDOSCOPY;  Service: Endoscopy;  Laterality: N/A;   ESOPHAGOGASTRODUODENOSCOPY (EGD) WITH PROPOFOL N/A 07/19/2016   Procedure: ESOPHAGOGASTRODUODENOSCOPY (EGD) WITH PROPOFOL;  Surgeon: Lollie Sails, MD;  Location: [facility] ENDOSCOPY;  Service: Endoscopy;  Laterality: N/A;   ESOPHAGOGASTRODUODENOSCOPY (EGD) WITH PROPOFOL N/A 04/24/2018   Procedure: ESOPHAGOGASTRODUODENOSCOPY (EGD) WITH PROPOFOL;   Surgeon: Lollie Sails, MD;  Location: [facility] ENDOSCOPY;  Service: Endoscopy;  Laterality: N/A;   hysterectomy (other)     LEFT HEART CATH AND CORONARY ANGIOGRAPHY N/A 03/24/2020   Procedure: LEFT HEART CATH AND CORONARY ANGIOGRAPHY;  Surgeon: Nelva Bush, MD;  Location: [facility] CV LAB;  Service: Cardiovascular;  Laterality: N/A;   Family History  Problem Relation Age of Onset   Heart failure Mother    Stroke Mother    Diabetes Mother    Congestive Heart Failure Mother    Lung cancer Father    Breast cancer Maternal Aunt    Social History   Socioeconomic History   Marital status: Married    Spouse name: Davon Folta   Number of children: 3   Years of education: Not on file   Highest education level: 12th grade  Occupational History   Occupation: retired  Tobacco Use   Smoking status: Every Day    Packs/day: 1.00    Years: 40.00    Total pack years: 40.00    Types: Cigarettes   Smokeless tobacco: Never  Vaping Use   Vaping Use: Former  Substance and Sexual Activity   Alcohol use: Yes    Comment: 1 glass of wine every few months on special occassions   Drug use: No   Sexual activity: Not on file  Other Topics Concern   Not on file  Social History Narrative   Retired. Married.  Lives in [place] with her husband.  She is not routinely exercising.   Social Determinants of Health   Financial Resource Strain: Low Risk  (03/09/2022)   Overall Financial Resource Strain (CARDIA)    Difficulty of Paying Living Expenses: Not hard at all  Food Insecurity: No Food Insecurity (03/09/2022)   Hunger Vital Sign    Worried About Running Out of Food in the Last Year: Never true    Ran Out of Food in the Last Year: Never true  Transportation Needs: No Transportation Needs (03/09/2022)   PRAPARE - Hydrologist (Medical): No    Lack of Transportation (Non-Medical): No  Physical Activity: Inactive (03/09/2022)   Exercise Vital Sign    Days  of Exercise per Week: 0 days    Minutes of Exercise per Session: 0 min  Stress: No Stress Concern Present (03/09/2022)   [place]    Feeling of Stress : Only a little  Social Connections: Moderately Isolated (03/09/2022)  Social Licensed conveyancer [NHANES]    Frequency of Communication with Friends and Family: More than three times a week    Frequency of Social Gatherings with Friends and Family: Once a week    Attends Religious Services: Never    Marine scientist or Organizations: No    Attends Music therapist: Never    Marital Status: Married    Tobacco Counseling Ready to quit: Not Answered Counseling given: Not Answered   Clinical Intake:  Pre-visit preparation completed: Yes  Pain : 0-10 Pain Score: 5  Pain Type: Acute pain Pain Location: Foot Pain Orientation: Right Pain Radiating Towards: big toe Pain Descriptors / Indicators: Constant, Stabbing     Nutritional Risks: None Diabetes: Yes CBG done?: No Did pt. bring in CBG monitor from home?: No  How often do you need to have someone help you when you read instructions, pamphlets, or other written materials from your doctor or pharmacy?: 1 - Never  Diabetic?yes Nutrition Risk Assessment:  Has the patient had any N/V/D within the last 2 months?  No  Does the patient have any non-healing wounds?  No  Has the patient had any unintentional weight loss or weight gain?  No   Diabetes:  Is the patient diabetic?  Yes  If diabetic, was a CBG obtained today?  No  Did the patient bring in their glucometer from home?  No  How often do you monitor your CBG's? Once/day.   Financial Strains and Diabetes Management:  Are you having any financial strains with the device, your supplies or your medication? No .  Does the patient want to be seen by Chronic Care Management for management of their diabetes?  No  Would the  patient like to be referred to a Nutritionist or for Diabetic Management?  No   Diabetic Exams:  Diabetic Eye Exam: Completed 04/20/21. Marland Kitchen Pt has been advised about the importance in completing this exam.  Diabetic Foot Exam: Completed 11/19/15. Pt has been advised about the importance in completing this exam. - declined referral   Interpreter Needed?: No  Information entered by :: Kirke Shaggy, LPN   Activities of Daily Living    03/09/2022    1:16 PM 03/06/2022    2:41 PM  In your present state of health, do you have any difficulty performing the following activities:  Hearing? 0 0  Vision? 0 0  Difficulty concentrating or making decisions? 0 0  Walking or climbing stairs? 1 1  Dressing or bathing? 0 0  Doing errands, shopping? 0 0  Preparing Food and eating ? N Y  Using the Toilet? N N  In the past six months, have you accidently leaked urine? Y Y  Do you have problems with loss of bowel control? N N  Managing your Medications? N N  Managing your Finances? N N  Housekeeping or managing your Housekeeping? N Y    Patient Care Team: Birdie Sons, MD as PCP - General (Family Medicine) [name] Kathlene November, MD as PCP - Cardiology (Cardiology) Minna Merritts, MD as Consulting Physician (Cardiology) Dingeldein, Remo Lipps, MD as Consulting Physician (Ophthalmology) Lavonia Dana, MD as Consulting Physician (Nephrology) Lucky Cowboy Erskine Squibb, MD as Referring Physician (Vascular Surgery) Lonia Farber, MD as Consulting Physician (Endocrinology) Lucilla Lame, MD as Consulting Physician (Gastroenterology) Neldon Labella, RN as Case Manager  Indicate any recent Medical Services you may have received from other than [facility] providers in the past year (date may be approximate).  Assessment:   This is a routine wellness examination for Bea.  Hearing/Vision screen Hearing Screening - Comments:: No aids Vision Screening - Comments:: Readers- [facility]  Dietary issues and  exercise activities discussed: Current Exercise Habits: The patient does not participate in regular exercise at present   Goals Addressed             This Visit's Progress    DIET - REDUCE SUGAR INTAKE         Depression Screen    03/09/2022    1:13 PM 03/08/2021    1:15 PM 04/01/2020    9:45 AM 02/18/2020    2:33 PM 11/20/2019    9:25 AM 08/16/2019   10:31 AM 08/04/2017    2:23 PM  PHQ 2/9 Scores  PHQ - 2 Score 1 0 0 0 1 1 0  PHQ- 9 Score 4    8      Fall Risk    03/09/2022    1:16 PM 03/06/2022    2:41 PM 03/18/2021    2:43 PM 03/08/2021    1:17 PM 03/04/2021   11:12 AM  Fall Risk   Falls in the past year? 0 1 0 0 0  Number falls in past yr: 0 0 0 0 0  Injury with Fall? 0 0  0 0  Risk for fall due to : No Fall Risks   No Fall Risks   Follow up Falls prevention discussed;Falls evaluation completed   Falls evaluation completed     FALL RISK PREVENTION PERTAINING TO THE HOME:  Any stairs in or around the home? No  If so, are there any without handrails? No  Home free of loose throw rugs in walkways, pet beds, electrical cords, etc? Yes  Adequate lighting in your home to reduce risk of falls? Yes   ASSISTIVE DEVICES UTILIZED TO PREVENT FALLS:  Life alert? No  Use of a cane, walker or w/c? Yes  Grab bars in the bathroom? Yes  Shower chair or bench in shower? Yes  Elevated toilet seat or a handicapped toilet? Yes    Cognitive Function:        03/09/2022    1:23 PM 08/03/2016   11:03 AM  6CIT Screen  What Year? 0 points 0 points  What month? 0 points 0 points  What time? 0 points 0 points  Count back from 20 0 points 0 points  Months in reverse 0 points 0 points  Repeat phrase 0 points 0 points  Total Score 0 points 0 points    Immunizations Immunization History  Administered Date(s) Administered   Fluad Quad(high Dose 65+) 11/20/2019, 11/25/2020, 11/16/2021   Influenza, High Dose Seasonal PF 11/01/2014, 11/24/2015, 11/08/2016, 11/22/2017    Influenza-Unspecified 10/22/2013   PFIZER Comirnaty(Gray Top)Covid-19 Tri-Sucrose Vaccine 08/04/2020   PFIZER(Purple Top)SARS-COV-2 Vaccination 04/02/2019, 04/23/2019, 01/02/2020, 08/04/2020   Pneumococcal Conjugate-13 05/22/2013   Pneumococcal Polysaccharide-23 11/02/2011   Zoster, Live 04/23/2012    TDAP status: Due, Education has been provided regarding the importance of this vaccine. Advised may receive this vaccine at local pharmacy or Health Dept. Aware to provide a copy of the vaccination record if obtained from local pharmacy or Health Dept. Verbalized acceptance and understanding.  Flu Vaccine status: Up to date  Pneumococcal vaccine status: Up to date  Covid-19 vaccine status: Completed vaccines  Qualifies for Shingles Vaccine? Yes   Zostavax completed No   Shingrix Completed?: No.    Education has been provided regarding the importance of this vaccine.  Patient has been advised to call insurance company to determine out of pocket expense if they have not yet received this vaccine. Advised may also receive vaccine at local pharmacy or Health Dept. Verbalized acceptance and understanding.  Screening Tests Health Maintenance  Topic Date Due   Diabetic kidney evaluation - Urine ACR  Never done   DTaP/Tdap/Td (1 - Tdap) Never done   Zoster Vaccines- Shingrix (1 of 2) Never done   Lung Cancer Screening  08/08/2017   Diabetic kidney evaluation - eGFR measurement  08/04/2021   HEMOGLOBIN A1C  09/16/2021   COVID-19 Vaccine (6 - 2023-24 season) 10/22/2021   OPHTHALMOLOGY EXAM  04/20/2022   DEXA SCAN  08/27/2022   Medicare Annual Wellness (AWV)  03/10/2023   COLONOSCOPY (Pts 45-26yr Insurance coverage will need to be confirmed)  11/18/2024   Pneumonia Vaccine 77 Years old  Completed   INFLUENZA VACCINE  Completed   Hepatitis C Screening  Completed   HPV VACCINES  Aged Out    Health Maintenance  Health Maintenance Due  Topic Date Due   Diabetic kidney evaluation - Urine  ACR  Never done   DTaP/Tdap/Td (1 - Tdap) Never done   Zoster Vaccines- Shingrix (1 of 2) Never done   Lung Cancer Screening  08/08/2017   Diabetic kidney evaluation - eGFR measurement  08/04/2021   HEMOGLOBIN A1C  09/16/2021   COVID-19 Vaccine (6 - 2023-24 season) 10/22/2021    Colorectal cancer screening: No longer required.   Mammogram status: No longer required due to age.  Bone Density status: Completed 08/26/20. Results reflect: Bone density results: OSTEOPOROSIS. Repeat every 2 years.  Lung Cancer Screening: (Low Dose CT Chest recommended if Age 76-80years, 30 pack-year currently smoking OR have quit w/in 15years.) does qualify.   Lung Cancer Screening Referral: declined referral  Additional Screening:  Hepatitis C Screening: does qualify; Completed 07/27/15  Vision Screening: Recommended annual ophthalmology exams for early detection of glaucoma and other disorders of the eye. Is the patient up to date with their annual eye exam?  Yes  Who is the provider or what is the name of the office in which the patient attends annual eye exams? [place] pt is not established with a provider, would they like to be referred to a provider to establish care? No .   Dental Screening: Recommended annual dental exams for proper oral hygiene  Community Resource Referral / Chronic Care Management: CRR required this visit?  No   CCM required this visit?  No      Plan:     I have personally reviewed and noted the following in the patient's chart:   Medical and social history Use of alcohol, tobacco or illicit drugs  Current medications and supplements including opioid prescriptions. Patient is currently taking opioid prescriptions. Information provided to patient regarding non-opioid alternatives. Patient advised to discuss non-opioid treatment plan with their provider. Functional ability and status Nutritional status Physical activity Advanced directives List of other  physicians Hospitalizations, surgeries, and ER visits in previous 12 months Vitals Screenings to include cognitive, depression, and falls Referrals and appointments  In addition, I have reviewed and discussed with patient certain preventive protocols, quality metrics, and best practice recommendations. A written personalized care plan for preventive services as well as general preventive health recommendations were provided to patient.     LDionisio David LPN   16/02/930  Nurse Notes: none

## 2022-03-09 NOTE — Patient Instructions (Signed)
Hannah Kirby , Thank you for taking time to come for your Medicare Wellness Visit. I appreciate your ongoing commitment to your health goals. Please review the following plan we discussed and let me know if I can assist you in the future.   Screening recommendations/referrals: Colonoscopy: aged out Mammogram: aged out Bone Density: 08/26/20 Recommended yearly ophthalmology/optometry visit for glaucoma screening and checkup Recommended yearly dental visit for hygiene and checkup  Vaccinations: Influenza vaccine: 11/16/21 Pneumococcal vaccine: 05/22/13 Tdap vaccine: n/d Shingles vaccine: n/d   Covid-19:04/02/19, 04/23/19, 01/02/20, 08/04/20  Advanced directives: no  Conditions/risks identified: none  Next appointment: Follow up in one year for your annual wellness visit 03/14/23 @ 1:20 pm by phone   Preventive Care 65 Years and Older, Female Preventive care refers to lifestyle choices and visits with your health care provider that can promote health and wellness. What does preventive care include? A yearly physical exam. This is also called an annual well check. Dental exams once or twice a year. Routine eye exams. Ask your health care provider how often you should have your eyes checked. Personal lifestyle choices, including: Daily care of your teeth and gums. Regular physical activity. Eating a healthy diet. Avoiding tobacco and drug use. Limiting alcohol use. Practicing safe sex. Taking low-dose aspirin every day. Taking vitamin and mineral supplements as recommended by your health care provider. What happens during an annual well check? The services and screenings done by your health care provider during your annual well check will depend on your age, overall health, lifestyle risk factors, and family history of disease. Counseling  Your health care provider may ask you questions about your: Alcohol use. Tobacco use. Drug use. Emotional well-being. Home and relationship  well-being. Sexual activity. Eating habits. History of falls. Memory and ability to understand (cognition). Work and work Statistician. Reproductive health. Screening  You may have the following tests or measurements: Height, weight, and BMI. Blood pressure. Lipid and cholesterol levels. These may be checked every 5 years, or more frequently if you are over 68 years old. Skin check. Lung cancer screening. You may have this screening every year starting at age 26 if you have a 30-pack-year history of smoking and currently smoke or have quit within the past 15 years. Fecal occult blood test (FOBT) of the stool. You may have this test every year starting at age 76. Flexible sigmoidoscopy or colonoscopy. You may have a sigmoidoscopy every 5 years or a colonoscopy every 10 years starting at age 8. Hepatitis C blood test. Hepatitis B blood test. Sexually transmitted disease (STD) testing. Diabetes screening. This is done by checking your blood sugar (glucose) after you have not eaten for a while (fasting). You may have this done every 1-3 years. Bone density scan. This is done to screen for osteoporosis. You may have this done starting at age 76. Mammogram. This may be done every 1-2 years. Talk to your health care provider about how often you should have regular mammograms. Talk with your health care provider about your test results, treatment options, and if necessary, the need for more tests. Vaccines  Your health care provider may recommend certain vaccines, such as: Influenza vaccine. This is recommended every year. Tetanus, diphtheria, and acellular pertussis (Tdap, Td) vaccine. You may need a Td booster every 10 years. Zoster vaccine. You may need this after age 76. Pneumococcal 13-valent conjugate (PCV13) vaccine. One dose is recommended after age 76. Pneumococcal polysaccharide (PPSV23) vaccine. One dose is recommended after age 76. Talk to  your health care provider about which  screenings and vaccines you need and how often you need them. This information is not intended to replace advice given to you by your health care provider. Make sure you discuss any questions you have with your health care provider. Document Released: 03/06/2015 Document Revised: 10/28/2015 Document Reviewed: 12/09/2014 Elsevier Interactive Patient Education  2017 [place] Prevention in the Home Falls can cause injuries. They can happen to people of all ages. There are many things you can do to make your home safe and to help prevent falls. What can I do on the outside of my home? Regularly fix the edges of walkways and driveways and fix any cracks. Remove anything that might make you trip as you walk through a door, such as a raised step or threshold. Trim any bushes or trees on the path to your home. Use bright outdoor lighting. Clear any walking paths of anything that might make someone trip, such as rocks or tools. Regularly check to see if handrails are loose or broken. Make sure that both sides of any steps have handrails. Any raised decks and porches should have guardrails on the edges. Have any leaves, snow, or ice cleared regularly. Use sand or salt on walking paths during winter. Clean up any spills in your garage right away. This includes oil or grease spills. What can I do in the bathroom? Use night lights. Install grab bars by the toilet and in the tub and shower. Do not use towel bars as grab bars. Use non-skid mats or decals in the tub or shower. If you need to sit down in the shower, use a plastic, non-slip stool. Keep the floor dry. Clean up any water that spills on the floor as soon as it happens. Remove soap buildup in the tub or shower regularly. Attach bath mats securely with double-sided non-slip rug tape. Do not have throw rugs and other things on the floor that can make you trip. What can I do in the bedroom? Use night lights. Make sure that you have a  light by your bed that is easy to reach. Do not use any sheets or blankets that are too big for your bed. They should not hang down onto the floor. Have a firm chair that has side arms. You can use this for support while you get dressed. Do not have throw rugs and other things on the floor that can make you trip. What can I do in the kitchen? Clean up any spills right away. Avoid walking on wet floors. Keep items that you use a lot in easy-to-reach places. If you need to reach something above you, use a strong step stool that has a grab bar. Keep electrical cords out of the way. Do not use floor polish or wax that makes floors slippery. If you must use wax, use non-skid floor wax. Do not have throw rugs and other things on the floor that can make you trip. What can I do with my stairs? Do not leave any items on the stairs. Make sure that there are handrails on both sides of the stairs and use them. Fix handrails that are broken or loose. Make sure that handrails are as long as the stairways. Check any carpeting to make sure that it is firmly attached to the stairs. Fix any carpet that is loose or worn. Avoid having throw rugs at the top or bottom of the stairs. If you do have throw rugs, attach  them to the floor with carpet tape. Make sure that you have a light switch at the top of the stairs and the bottom of the stairs. If you do not have them, ask someone to add them for you. What else can I do to help prevent falls? Wear shoes that: Do not have high heels. Have rubber bottoms. Are comfortable and fit you well. Are closed at the toe. Do not wear sandals. If you use a stepladder: Make sure that it is fully opened. Do not climb a closed stepladder. Make sure that both sides of the stepladder are locked into place. Ask someone to hold it for you, if possible. Clearly mark and make sure that you can see: Any grab bars or handrails. First and last steps. Where the edge of each step  is. Use tools that help you move around (mobility aids) if they are needed. These include: Canes. Walkers. Scooters. Crutches. Turn on the lights when you go into a dark area. Replace any light bulbs as soon as they burn out. Set up your furniture so you have a clear path. Avoid moving your furniture around. If any of your floors are uneven, fix them. If there are any pets around you, be aware of where they are. Review your medicines with your doctor. Some medicines can make you feel dizzy. This can increase your chance of falling. Ask your doctor what other things that you can do to help prevent falls. This information is not intended to replace advice given to you by your health care provider. Make sure you discuss any questions you have with your health care provider. Document Released: 12/04/2008 Document Revised: 07/16/2015 Document Reviewed: 03/14/2014 Elsevier Interactive Patient Education  2017 [employer].

## 2022-03-18 DIAGNOSIS — E1169 Type 2 diabetes mellitus with other specified complication: Secondary | ICD-10-CM | POA: Diagnosis not present

## 2022-03-18 DIAGNOSIS — Z794 Long term (current) use of insulin: Secondary | ICD-10-CM | POA: Diagnosis not present

## 2022-03-18 DIAGNOSIS — I152 Hypertension secondary to endocrine disorders: Secondary | ICD-10-CM | POA: Diagnosis not present

## 2022-03-18 DIAGNOSIS — E785 Hyperlipidemia, unspecified: Secondary | ICD-10-CM | POA: Diagnosis not present

## 2022-03-18 DIAGNOSIS — E1121 Type 2 diabetes mellitus with diabetic nephropathy: Secondary | ICD-10-CM | POA: Diagnosis not present

## 2022-03-18 DIAGNOSIS — E1159 Type 2 diabetes mellitus with other circulatory complications: Secondary | ICD-10-CM | POA: Diagnosis not present

## 2022-03-18 LAB — HEMOGLOBIN A1C: Hemoglobin A1C: 6.9

## 2022-03-29 ENCOUNTER — Other Ambulatory Visit: Payer: Self-pay | Admitting: *Deleted

## 2022-03-29 DIAGNOSIS — Z794 Long term (current) use of insulin: Secondary | ICD-10-CM

## 2022-04-05 ENCOUNTER — Telehealth: Payer: Self-pay

## 2022-04-05 NOTE — Progress Notes (Signed)
Care Management & Coordination Services Pharmacy Team  Reason for Encounter: Appointment Reminder  Contacted patient to confirm in office appointment with Hannah Kirby, PharmD on 04/08/2022 at 10:00 am.  Spoke with patient on 04/05/2022   Do you have any problems getting your medications? No If yes what types of problems are you experiencing?  Patient denies any problems or issues getting her medications.  What is your top health concern you would like to discuss at your upcoming visit?  Patient denies any health concerns at this time.   Have you seen any other providers since your last visit with PCP? No  Patient is aware to bring a list of all her medication/supplements she is taking.Patient is aware her appointment will be at her PCP office.  Chart review:  Recent office visits:  03/09/2022 Kirke Shaggy LPN (PCP Office) Medicare Wellness completed, No medication Changes noted,No orders placed 11/16/2021 Dr. Alba Cory MD (PCP) No medication Changes noted, No orders placed 10/08/2021 Neldon Labella RN (PCP office) No medication changes noted  Recent consult visits:  03/18/2022 Dr. Honor Junes MD (Endocrinology) Restart her Ozmepic 0.5 mg weekly, return to clinic in 6 months   02/11/2022 Eulogio Ditch NP (Vascular Surgery) No Medication changes noted 02/04/2022 Luella Cook PA (Neurology) Start gabapentin 100 to 200 mg as needed , No orders placed, Follow-up in 6 to 12 months  12/21/2021 Dr. Holley Raring MD (Nephrology)No Medication changes noted, return in 4 months 11/24/2021 Dr. Harlow Mares MD [facility]) Unable to see note  Hospital visits:  None in previous 6 months   Star Rating Drugs:  Medication: Lisinopril 20 mg - Not Listed in chart as patient receives from [facility] Medication: Rosuvastatin 20 mg- Not Listed in chart as patient receives from [facility] Medication: Ozempic 0.5 mg- Not Listed in chart as patient receives from [facility]: Annual wellness visit in last  year? Yes Last completed 03/09/2022 Diabetic Kidney evaluation- Urine ACR Dtap Vaccine Shingrix Vaccine Lung Cancer Screening - Last completed 08/08/2016 Diabetic Kidney evaluation- eGFR measurement- Last completed 08/04/2020 Hemoglobin A1C COVID-19 Vaccine  If Diabetic: Last eye exam / retinopathy screening:Last completed 04/20/2021 Last diabetic foot exam:Last completed per patient chart note 03/18/2022.   [place] Pharmacist Assistant [phone]

## 2022-04-06 ENCOUNTER — Other Ambulatory Visit: Payer: Self-pay | Admitting: Family Medicine

## 2022-04-06 DIAGNOSIS — M48061 Spinal stenosis, lumbar region without neurogenic claudication: Secondary | ICD-10-CM

## 2022-04-07 MED ORDER — HYDROCODONE-ACETAMINOPHEN 5-325 MG PO TABS: 1.0000 | ORAL_TABLET | Freq: Four times a day (QID) | ORAL | 0 refills | Status: DC | PRN

## 2022-04-08 ENCOUNTER — Ambulatory Visit: Payer: Medicare Other

## 2022-04-08 ENCOUNTER — Other Ambulatory Visit: Payer: Self-pay | Admitting: Family Medicine

## 2022-04-08 DIAGNOSIS — N1832 Chronic kidney disease, stage 3b: Secondary | ICD-10-CM

## 2022-04-08 DIAGNOSIS — I5032 Chronic diastolic (congestive) heart failure: Secondary | ICD-10-CM

## 2022-04-08 DIAGNOSIS — J984 Other disorders of lung: Secondary | ICD-10-CM

## 2022-04-08 DIAGNOSIS — E1122 Type 2 diabetes mellitus with diabetic chronic kidney disease: Secondary | ICD-10-CM | POA: Diagnosis not present

## 2022-04-08 DIAGNOSIS — E7849 Other hyperlipidemia: Secondary | ICD-10-CM | POA: Diagnosis not present

## 2022-04-08 DIAGNOSIS — Z794 Long term (current) use of insulin: Secondary | ICD-10-CM | POA: Diagnosis not present

## 2022-04-08 DIAGNOSIS — J449 Chronic obstructive pulmonary disease, unspecified: Secondary | ICD-10-CM

## 2022-04-08 MED ORDER — ALBUTEROL SULFATE HFA 108 (90 BASE) MCG/ACT IN AERS: 2.0000 | INHALATION_SPRAY | RESPIRATORY_TRACT | 3 refills | Status: AC | PRN

## 2022-04-08 MED ORDER — TRELEGY ELLIPTA 100-62.5-25 MCG/ACT IN AEPB: 1.0000 | INHALATION_SPRAY | Freq: Every day | RESPIRATORY_TRACT | 0 refills | Status: DC

## 2022-04-08 NOTE — Patient Instructions (Signed)
Visit Information It was great speaking with you today!  Please let me know if you have any questions about our visit.  Plan:  We will plan to start Trelegy 1 puff daily. Do not use your Breo or Spiriva while using Trelegy  Print copy of patient instructions, educational materials, and care plan provided in person.   Telephone follow up appointment with pharmacy team member scheduled for: 05/06/2022 at 11:00  Junius Argyle, PharmD, [address] [phone]

## 2022-04-08 NOTE — Progress Notes (Signed)
Care Management & Coordination Services Pharmacy Note  04/08/2022 Name:  Hannah Kirby MRN:  [id number] DOB:  27-Jan-1946  Summary: Patient presents for initial consult.   -Discussed benefits of SGLT2 in HF and CKD today. Will plan to start Jardiance as this appears to be covered under [facility] formuary pending renal results.    -Sample of Trelegy provided today to improve adherence and ease of use. Patient counseled on use of Trelegy and albuterol inhalers.   Patient wanted to defer starting CGM today.   Recommendations/Changes made from today's visit: -STOP Spiriva -STOP Breo  -START Trelegy 1 puff daily.  -Recheck CMP, Microalbumin, FLP today.   Follow up plan: CPP follow-up 1 month   Subjective: Hannah Kirby is an 77 y.o. year old female who is a primary patient of Caryn Section, Kirstie Peri, MD.  The care coordination team was consulted for assistance with disease management and care coordination needs.    Engaged with patient face to face for initial visit.  Recent office visits: 03/09/2022 Kirke Shaggy LPN (PCP Office) Medicare Wellness completed, No medication Changes noted,No orders placed 11/16/2021 Dr. Alba Cory MD (PCP) No medication Changes noted, No orders placed 10/08/2021 Neldon Labella RN (PCP office) No medication changes noted  Recent consult visits: 03/18/2022 Dr. Honor Junes MD (Endocrinology) Restart her Ozmepic 0.5 mg weekly, return to clinic in 6 months   02/11/2022 Eulogio Ditch NP (Vascular Surgery) No Medication changes noted 02/04/2022 Luella Cook PA (Neurology) Start gabapentin 100 to 200 mg as needed , No orders placed, Follow-up in 6 to 12 months  12/21/2021 Dr. Holley Raring MD (Nephrology)No Medication changes noted, return in 4 months 11/24/2021 Dr. Harlow Mares MD Rosanne Gutting) Unable to see note  Hospital visits: None in previous 6 months   Objective:  Lab Results  Component Value Date   CREATININE 1.28 (H) 08/04/2020   BUN 22 08/04/2020   EGFR 44  (L) 08/04/2020   GFRNONAA 47 (L) 03/31/2020   GFRAA 55 (L) 03/31/2020   NA 140 08/04/2020   K 3.9 08/04/2020   CALCIUM 10.4 (H) 08/04/2020   CO2 24 08/04/2020   GLUCOSE 101 (H) 08/04/2020    Lab Results  Component Value Date/Time   HGBA1C 6.5 03/19/2021 12:00 AM   HGBA1C 8.2 (A) 11/20/2019 10:36 AM   HGBA1C 8.0 (H) 08/01/2019 12:15 PM   HGBA1C 6.9 10/09/2017 12:00 AM   MICROALBUR 127 06/21/2016 12:00 AM    Last diabetic Eye exam:  Lab Results  Component Value Date/Time   HMDIABEYEEXA No Retinopathy 04/20/2021 12:00 AM    Last diabetic Foot exam: No results found for: "HMDIABFOOTEX"   Lab Results  Component Value Date   CHOL 173 08/04/2020   HDL 48 08/04/2020   LDLCALC 82 08/04/2020   TRIG 262 (H) 08/04/2020   CHOLHDL 3.6 08/04/2020       Latest Ref Rng & Units 08/04/2020   11:27 AM 03/31/2020   10:34 AM 03/23/2020    6:00 PM  Hepatic Function  Total Protein 6.0 - 8.5 g/dL 7.1   7.3   Albumin 3.7 - 4.7 g/dL 4.4  4.1  3.8   AST 0 - 40 IU/L 20   34   ALT 0 - 32 IU/L 19   19   Alk Phosphatase 44 - 121 IU/L 125   86   Total Bilirubin 0.0 - 1.2 mg/dL 0.3   0.6   Bilirubin, Direct 0.0 - 0.2 mg/dL   <0.1     Lab Results  Component Value Date/Time  TSH 2.910 04/12/2021 09:50 AM   TSH 2.340 08/04/2020 11:27 AM       Latest Ref Rng & Units 04/12/2021    9:50 AM 08/04/2020   11:27 AM 03/31/2020   10:34 AM  CBC  WBC 3.4 - 10.8 x10E3/uL 9.2  9.6  9.0   Hemoglobin 11.1 - 15.9 g/dL 15.9  16.9  16.4   Hematocrit 34.0 - 46.6 % 46.8  49.9  47.7   Platelets 150 - 450 x10E3/uL 223  225  232     Lab Results  Component Value Date/Time   VD25OH 43.6 04/12/2021 09:50 AM   VITAMINB12 >2000 (H) 04/12/2021 09:50 AM    Clinical ASCVD: Yes  The ASCVD Risk score (Arnett DK, et al., 2019) failed to calculate for the following reasons:   The patient has a prior MI or stroke diagnosis       03/09/2022    1:13 PM 03/08/2021    1:15 PM 04/01/2020    9:45 AM  Depression screen PHQ  2/9  Decreased Interest  0 0  Down, Depressed, Hopeless 1 0 0  PHQ - 2 Score 1 0 0  Altered sleeping 1    Tired, decreased energy 1    Trouble concentrating 1    PHQ-9 Score 4       Social History   Tobacco Use  Smoking Status Every Day   Packs/day: 1.00   Years: 40.00   Total pack years: 40.00   Types: Cigarettes  Smokeless Tobacco Never   BP Readings from Last 3 Encounters:  02/11/22 (!) 162/95  05/18/21 (!) 148/96  05/13/21 134/80   Pulse Readings from Last 3 Encounters:  02/11/22 72  05/18/21 86  05/13/21 76   Wt Readings from Last 3 Encounters:  03/09/22 159 lb (72.1 kg)  02/11/22 159 lb (72.1 kg)  05/18/21 166 lb (75.3 kg)   BMI Readings from Last 3 Encounters:  03/09/22 26.46 kg/m  02/11/22 26.46 kg/m  05/18/21 26.79 kg/m    Allergies  Allergen Reactions   Spironolactone Shortness Of Breath   Sulfonamide Derivatives Other (See Comments)    Last taken as a child; made her mouth break out   Other Other (See Comments)    Mouth blisters   Sulfa Antibiotics Other (See Comments)    Mouth blisters Welts on mouth    Codeine Nausea And Vomiting and Other (See Comments)    Nausea   Prednisone Palpitations and Other (See Comments)    Made her legs "feel weird."    Medications Reviewed Today     Reviewed by Dionisio David, LPN (Licensed Practical Nurse) on 03/09/22 at [place] List Status: <None>   Medication Order Taking? Sig Documenting Provider Last Dose Status Informant  acetaminophen (TYLENOL) 500 MG tablet [id number] Yes Take 500 mg by mouth every 6 (six) hours as needed. [provider] Taking Active Self  albuterol (VENTOLIN HFA) 108 (90 Base) MCG/ACT inhaler [id number] Yes Inhale 2 puffs into the lungs every 4 (four) hours as needed for wheezing or shortness of breath. Birdie Sons, MD Taking Active   aspirin (ASPIR-81) 81 MG EC tablet [id number] Yes Take 81 mg by mouth daily. [provider] Taking Active Self  clopidogrel  (PLAVIX) 75 MG tablet [id number] Yes Take 1 tablet (75 mg total) by mouth daily. Minna Merritts, MD Taking Active   cyanocobalamin 1000 MCG tablet [id number] Yes Take 1,000 mcg by mouth daily. [provider] Taking Active Self  ezetimibe (  ZETIA) 10 MG tablet [id number] Yes Take 1 tablet (10 mg total) by mouth daily. Minna Merritts, MD Taking Active   fluticasone furoate-vilanterol (BREO ELLIPTA) 100-25 MCG/ACT AEPB [id number] Yes Inhale 1 puff into the lungs daily. Birdie Sons, MD Taking Active   HYDROcodone-acetaminophen (NORCO/VICODIN) 5-325 MG tablet [id number] Yes Take 1 tablet by mouth every 6 (six) hours as needed. Birdie Sons, MD Taking Active   insulin glargine (LANTUS) 100 UNIT/ML injection [id number] Yes Inject 22 Units into the skin daily.  [provider] Taking Active Self           Med Note Janan Ridge   Tue Aug 06, 2019  9:01 AM)    lisinopril (ZESTRIL) 20 MG tablet [id number] Yes Take 1 tablet (20 mg total) by mouth daily. Minna Merritts, MD Taking Active   metoprolol tartrate (LOPRESSOR) 100 MG tablet [id number] Yes Take 1 tablet (100 mg total) by mouth 2 (two) times daily. Minna Merritts, MD Taking Active   Multiple Vitamins-Minerals (DAILY MULTI) TABS [id number] Yes Take 1 tablet by mouth daily. [provider] Taking Active Self  nitroGLYCERIN (NITROSTAT) 0.4 MG SL tablet [id number] Yes Place 1 tablet (0.4 mg total) under the tongue every 5 (five) minutes as needed. Minna Merritts, MD Taking Active   Omega-3 Fatty Acids (FISH OIL) 1000 MG CAPS [id number] Yes Take 2 capsules by mouth in the morning and at bedtime.  [provider] Taking Active Self  ondansetron (ZOFRAN ODT) 4 MG disintegrating tablet [id number] Yes Take 1 tablet (4 mg total) by mouth every 8 (eight) hours as needed for nausea or vomiting. Rudene Re, MD Taking Active Self           Med Note Janan Ridge   Tue Aug 06, 2019  9:01 AM)     RABEprazole (ACIPHEX) 20 MG tablet [id number] Yes Take 1 tablet (20 mg total) by mouth daily. 15 Mins. before evening meal Lucilla Lame, MD Taking Active   rosuvastatin (CRESTOR) 20 MG tablet [id number] Yes Take 1 tablet (20 mg total) by mouth daily. Minna Merritts, MD Taking Active   Semaglutide,0.25 or 0.5MG/DOS, 2 MG/1.5ML SOPN [id number] Yes Inject 0.5 mg into the skin once a week. [provider] Taking Active   sucralfate (CARAFATE) 1 g tablet [id number] Yes Take 1 tablet (1 g total) by mouth 2 (two) times daily. 15 Minutes before evening meal and at bed time Lucilla Lame, MD Taking Active   tiotropium [facility]) 18 MCG inhalation capsule [id number] Yes Place 1 capsule (18 mcg total) into inhaler and inhale daily. Birdie Sons, MD Taking Active             SDOH:  (Social Determinants of Health) assessments and interventions performed: Yes SDOH Interventions    Flowsheet Row Clinical Support from 03/09/2022 in [place] from 03/08/2021 in [place] Management from 04/01/2020 in [place] Support from 02/18/2020 in [place]  SDOH Interventions      Food Insecurity Interventions Intervention Not Indicated Intervention Not Indicated Intervention Not Indicated --  Housing Interventions Intervention Not Indicated Intervention Not Indicated -- --  Transportation Interventions Intervention Not Indicated Intervention Not Indicated Other (Comment)  [Completed outreach with [facility] Care Guide regarding transportation resources.] --  Utilities Interventions Intervention Not Indicated -- -- --  Alcohol Usage Interventions Intervention Not Indicated (Score <7) -- -- --  Depression Interventions/Treatment  Medication -- -- --  Financial Strain Interventions Intervention Not Indicated Intervention Not Indicated -- --  Physical Activity  Interventions Patient Refused Patient Refused -- Patient Refused  Stress Interventions Intervention Not Indicated Intervention Not Indicated -- --  Social Connections Interventions Intervention Not Indicated Intervention Not Indicated -- --       Medication Assistance: None required.  Patient affirms current coverage meets needs.  Medication Access: Within the past 30 days, how often has patient missed a dose of medication? None Is a pillbox or other method used to improve adherence? No  Factors that may affect medication adherence? no barriers identified Are meds synced by current pharmacy? No  Are meds delivered by current pharmacy? Yes  Does patient experience delays in picking up medications due to transportation concerns? No   [facility] Services Reviewed: Is patient disadvantaged to use [facility] Pharm[facility]?: Yes  Current Rx insurance plan: [facility] Name and location of Current pharmacy:  [facility] [id number] [place], [place] - [place] [address] Phone: [phone] Fax: [phone]  [facility] [place], [place] [address] [facility] [address] [place] [address] Phone: [phone] Fax: [place] [facility] [id number] [place], [place] - [place] [facility] [place] [address] [place] [address] Phone: [phone] Fax: [phone]  [facility] Pharmacy services reviewed with patient today?: No  Patient requests to transfer care to [facility] Pharmacy?: No  Reason patient declined to change pharmacies: Disadvantaged due to insurance/mail order  Compliance/Adherence/Medication fill history: Care Gaps: Annual wellness visit in last year? Yes Last completed 03/09/2022 Diabetic Kidney evaluation- Urine ACR Dtap Vaccine Shingrix Vaccine Lung Cancer Screening - Last completed 08/08/2016 Diabetic Kidney evaluation- eGFR measurement- Last completed  08/04/2020 Hemoglobin A1C COVID-19 Vaccine  Star-Rating Drugs: Medication: Lisinopril 20 mg - Not Listed in chart as patient receives from [facility] Medication: Rosuvastatin 20 mg- Not Listed in chart as patient receives from [facility] Medication: Ozempic 0.5 mg- Not Listed in chart as patient receives from [facility]   Assessment/Plan  Heart Failure (Goal: manage symptoms and prevent exacerbations) -Controlled -Last ejection fraction: 60-65% (Date: Jan 2022) -HF type: Diastolic -NYHA Class: III (marked limitation of activity) -AHA HF Stage: C (Heart disease and symptoms present) -Current treatment: Lisinopril 20 mg daily  Metoprolol Tartrate 100 mg twice daily  -Medications previously tried: NA  -Current home BP/HR readings: Monitoring blood pressure sporadically.  -Current home daily weights: NA -Reports some dizziness on oncassion when patient is "rushing around"  -Educated on Importance of weighing daily and importance of blood pressure control -Discussed benefits of SGLT2 in HF and CKD today. Will plan to start Jardiance as this appears to be covered under [place] formuary pending renal results.   -Recheck CMP, Microalbumin, FLP today.   Hyperlipidemia: (LDL goal < 55) -Uncontrolled -History of AAA, TIA, NSTEMI 2022  -Current treatment: Ezetimibe 10 mg daily  Fish Oil 1000 mg 2 caps daily  Rosuvastatin 20 mg daily -Current treatment: Aspirin 81 mg daily  Clopidogrel 75 mg daily   -Medications previously tried: NA  -Cholesterol elevated but not recently checked. Patient near renal cutoff for dose reducing rosuvastatin. Would not titrate at this time. If not at goal would consider PCSK9 inhibitor in this patient.  -Recommended to continue current medication  Diabetes (A1c goal <8%) -Controlled -Current medications: Lantus 22 units daily  Ozempic 0.25 mg weekly  -Medications previously tried: Jardiance, Metformin   -  Current home glucose readings fasting glucose: 80-110s, up to  140s if she has had coffee  -Denies hypoglycemic/hyperglycemic symptoms -Discussed benefits of SGLT2 inhibitor and CGM in this patient. Patient wanted to defer starting CGM today.  -Recommended to continue current medication  COPD (Goal: control symptoms and prevent exacerbations) -Controlled -Current treatment  Albuterol HFA  Breo 1 puff daily  Spriva 1 capsule daily  -Medications previously tried: NA  -Current COPD Classification:  B (high sx, 0-1 moderate exacerbations, no hospitalizations) CAT ASSESSMENT  Rank each of the following items on a scale of 0 to 5 (with 5 being most severe) Write a # 0-5 in each box  I never cough (0) > I cough all the time (5) 3  I have no phlegm (mucus) in my chest (0) > My chest is completely full of phlegm (mucus) (5) 3  My chest does not feel tight at all (0) > My chest feels very tight (5) 0  When I walk up a hill or one flight of stairs I am not breathless (0) > When I walk up a hill or one flight of stairs I am very breathless (5) 5  I am not limited doing any activities at home (0) > I am very limited doing activities at home (5) 4  I am confident leaving my home despite my lung function (0) > I am not at all confident leaving my home because of my lung condition (5)  0  I sleep soundly (0) > I don't sleep soundly because of my lung condition (5) 1  I have lots of energy (0) > I have no energy at all (5) 2  -CAT score: 18 (medium burden)  -Pulmonary function testing: None noted  -Exacerbations requiring treatment in last 6 months: NA -Patient reports consistent use of maintenance inhaler -Frequency of rescue inhaler use: 1-2 times daily  -Sample of Trelegy provided today to improve adherence and ease of use. Patient counseled on use of Trelegy and albuterol inhalers.  -STOP Spiriva STOP Breo  -START Trelegy 1 puff daily.   Chronic Kidney Disease Stage 3b  -All medications assessed for renal dosing and appropriateness in chronic kidney  disease. -Recommended to continue current medication   Junius Argyle, PharmD, [place], CPP  Clinical Pharmacist Practitioner  [facility] [phone]

## 2022-04-09 LAB — LIPID PANEL
Chol/HDL Ratio: 2.6 ratio (ref 0.0–4.4)
Cholesterol, Total: 160 mg/dL (ref 100–199)
HDL: 62 mg/dL (ref >39)
LDL Chol Calc (NIH): 73 mg/dL (ref 0–99)
Triglycerides: 148 mg/dL (ref 0–149)
VLDL Cholesterol Cal: 25 mg/dL (ref 5–40)

## 2022-04-09 LAB — COMPREHENSIVE METABOLIC PANEL
ALT: 16 IU/L (ref 0–32)
AST: 22 IU/L (ref 0–40)
Albumin/Globulin Ratio: 1.5 (ref 1.2–2.2)
Albumin: 4.1 g/dL (ref 3.8–4.8)
Alkaline Phosphatase: 133 IU/L — ABNORMAL HIGH (ref 44–121)
BUN/Creatinine Ratio: 13 (ref 12–28)
BUN: 23 mg/dL (ref 8–27)
Bilirubin Total: 0.4 mg/dL (ref 0.0–1.2)
CO2: 26 mmol/L (ref 20–29)
Calcium: 11.4 mg/dL — ABNORMAL HIGH (ref 8.7–10.3)
Chloride: 101 mmol/L (ref 96–106)
Creatinine, Ser: 1.8 mg/dL — ABNORMAL HIGH (ref 0.57–1.00)
Globulin, Total: 2.7 g/dL (ref 1.5–4.5)
Glucose: 112 mg/dL — ABNORMAL HIGH (ref 70–99)
Potassium: 4.4 mmol/L (ref 3.5–5.2)
Sodium: 141 mmol/L (ref 134–144)
Total Protein: 6.8 g/dL (ref 6.0–8.5)
eGFR: 29 mL/min/{1.73_m2} — ABNORMAL LOW (ref >59)

## 2022-04-11 ENCOUNTER — Telehealth: Payer: Self-pay

## 2022-04-11 ENCOUNTER — Encounter: Payer: Self-pay | Admitting: *Deleted

## 2022-04-11 ENCOUNTER — Telehealth: Payer: Self-pay | Admitting: *Deleted

## 2022-04-11 NOTE — Patient Outreach (Signed)
  Care Coordination   Initial Visit Note   04/11/2022 Name: Hannah Kirby MRN: [id number] DOB: 1945/07/17  Hannah Kirby is a 77 y.o. year old female who sees Fisher, Kirstie Peri, MD for primary care. I spoke with  Hannah Earl Boutin by phone today.  What matters to the patients health and wellness today?  Remaining independent, keeping blood sugars controlled.     Goals Addressed             This Visit's Progress    COMPLETED: Care Coordination Activities - no follow up needed       Care Coordination Interventions: Evaluation of current treatment plan related to CM, HTN, CKD and patient's adherence to plan as established by provider Reviewed medications with patient and discussed affordability and adherence Collaborated with Gwendalyn Ege, RN regarding need for new order for CCM Reviewed scheduled/upcoming provider appointments including Nephrology on 2/21, dentist on 3/4, Neurology on 6/14, Endocrinology on 7/26.  Call placed to PCP office to schedule follow up for 7/19 as last office visit was 04/12/21.  AWV was completed on 03/09/22 Advised patient, providing education and rationale, to check cbg daily and record, calling provider for findings outside established parameters  Discussed plans with patient for ongoing care management follow up and provided patient with direct contact information for care management team Screening for signs and symptoms of depression related to chronic disease state  Assessed social determinant of health barriers         SDOH assessments and interventions completed:  Yes     Care Coordination Interventions:  Yes, provided   Follow up plan:  to be completed by CCM team    Encounter Outcome:  Pt. Visit Completed   Valente David, RN, MSN, [place] Management Care Management Coordinator [phone]

## 2022-04-11 NOTE — Patient Instructions (Signed)
Visit Information  Thank you for taking time to visit with me today. Please don't hesitate to contact me if I can be of assistance to you before our next scheduled telephone appointment.  Following are the goals we discussed today:  Continue monitoring blood sugars daily. Check blood pressure at least 2-3 times a week. PCP appointment scheduled for 7/19 @ 10am.   Please call the Suicide and Crisis Lifeline: 988 call the [place] National Suicide Prevention Lifeline: 438-424-1004 or TTY: (917)007-3157 TTY 669-336-0527) to talk to a trained counselor call 1-800-273-TALK (toll free, 24 hour hotline) call 911 if you are experiencing a Mental Health or [place] or need someone to talk to.  Patient verbalizes understanding of instructions and care plan provided today and agrees to view in [place]. Active MyChart status and patient understanding of how to access instructions and care plan via MyChart confirmed with patient.     The patient has been provided with contact information for the care management team and has been advised to call with any health related questions or concerns.   [place] Management Care Management Coordinator [phone]

## 2022-04-11 NOTE — Progress Notes (Signed)
Care Management & Coordination Services Pharmacy Team  Reason for Encounter: COPD  Contacted patient to discuss COPD disease state.  Spoke with patient on 04/11/2022   Per Clinical pharmacist,Patient given sample of Trelegy, has she been using? Ask patient if she would like to continue with Trelegy going forward  Patient states she has not started Trelegy yet.Patient states she is going to start it today since it is the beginning of a new week.   Current COPD regimen:  Trelegy 1 puff daily  Albuterol HFA       No data to display         Any recent hospitalizations or ED visits since last visit with CPP? No Denies COPD symptoms, including Increased shortness of breath , Rescue medicine is not helping, Shortness of breath at rest, Symptoms worse with exercise, Symptoms worse at night, and Wheezing Patient reports she will have symptoms of shortness of breath when she is very active.  What recent interventions/DTPs have been made by any provider to improve breathing since last visit: Have you had exacerbation/flare-up since last visit? No What do you do when you are short of breath?  Rescue medication and Rest  Respiratory Devices/Equipment Do you have a nebulizer? No Do you use a Peak Flow Meter? No Do you use a maintenance inhaler? Yes How often do you forget to use your daily inhaler? Never Do you use a rescue inhaler? Yes How often do you use your rescue inhaler?  prn Do you use a spacer with your inhaler? No  Adherence Review: Does the patient have >5 day gap between last estimated fill date for maintenance inhaler medications? No  Star Rating Drugs:  Medication: Lisinopril 20 mg - Not Listed in chart as patient receives from [facility] Medication: Rosuvastatin 20 mg- Not Listed in chart as patient receives from [facility] Medication: Ozempic 0.5 mg- Not Listed in chart as patient receives from [facility]: Annual wellness visit in last year? Yes Last completed  03/09/2022 Diabetic Kidney evaluation- Urine ACR Dtap Vaccine Shingrix Vaccine Lung Cancer Screening - Last completed 08/08/2016 Diabetic Kidney evaluation- eGFR measurement- Last completed 08/04/2020 Hemoglobin A1C COVID-19 Vaccine   If Diabetic: Last eye exam / retinopathy screening:Last completed 04/20/2021 Last diabetic foot exam:Last completed per patient chart note 03/18/2022.  Chart Review:  Recent office visits:  None ID  Recent consult visits:  None ID  Hospital visits:  None in previous 6 months  Medications: Outpatient Encounter Medications as of 04/11/2022  Medication Sig   acetaminophen (TYLENOL) 500 MG tablet Take 500 mg by mouth every 6 (six) hours as needed.   albuterol (VENTOLIN HFA) 108 (90 Base) MCG/ACT inhaler Inhale 2 puffs into the lungs every 4 (four) hours as needed for wheezing or shortness of breath.   aspirin (ASPIR-81) 81 MG EC tablet Take 81 mg by mouth daily.   clopidogrel (PLAVIX) 75 MG tablet Take 1 tablet (75 mg total) by mouth daily.   cyanocobalamin 1000 MCG tablet Take 1,000 mcg by mouth daily.   ezetimibe (ZETIA) 10 MG tablet Take 1 tablet (10 mg total) by mouth daily.   Fluticasone-Umeclidin-Vilant (TRELEGY ELLIPTA) 100-62.5-25 MCG/ACT AEPB Inhale 1 puff into the lungs daily.   HYDROcodone-acetaminophen (NORCO/VICODIN) 5-325 MG tablet Take 1 tablet by mouth every 6 (six) hours as needed.   insulin glargine (LANTUS) 100 UNIT/ML injection Inject 22 Units into the skin daily.    lisinopril (ZESTRIL) 20 MG tablet Take 1 tablet (20 mg total) by mouth daily.  metoprolol tartrate (LOPRESSOR) 100 MG tablet Take 1 tablet (100 mg total) by mouth 2 (two) times daily.   Multiple Vitamins-Minerals (DAILY MULTI) TABS Take 1 tablet by mouth daily.   nitroGLYCERIN (NITROSTAT) 0.4 MG SL tablet Place 1 tablet (0.4 mg total) under the tongue every 5 (five) minutes as needed.   Omega-3 Fatty Acids (FISH OIL) 1000 MG CAPS Take 2 capsules by mouth in the  morning and at bedtime.    ondansetron (ZOFRAN ODT) 4 MG disintegrating tablet Take 1 tablet (4 mg total) by mouth every 8 (eight) hours as needed for nausea or vomiting.   RABEprazole (ACIPHEX) 20 MG tablet Take 1 tablet (20 mg total) by mouth daily. 15 Mins. before evening meal   rosuvastatin (CRESTOR) 20 MG tablet Take 1 tablet (20 mg total) by mouth daily.   Semaglutide,0.25 or 0.'5MG'$ /DOS, 2 MG/1.5ML SOPN Inject 0.25 mg into the skin once a week.   sucralfate (CARAFATE) 1 g tablet Take 1 tablet (1 g total) by mouth 2 (two) times daily. 15 Minutes before evening meal and at bed time   No facility-administered encounter medications on file as of 04/11/2022.     [place] Pharmacist Assistant [phone]

## 2022-04-12 ENCOUNTER — Encounter: Payer: Self-pay | Admitting: Family Medicine

## 2022-04-13 DIAGNOSIS — N1832 Chronic kidney disease, stage 3b: Secondary | ICD-10-CM | POA: Diagnosis not present

## 2022-04-13 DIAGNOSIS — I10 Essential (primary) hypertension: Secondary | ICD-10-CM | POA: Diagnosis not present

## 2022-04-13 DIAGNOSIS — E1122 Type 2 diabetes mellitus with diabetic chronic kidney disease: Secondary | ICD-10-CM | POA: Diagnosis not present

## 2022-04-19 ENCOUNTER — Other Ambulatory Visit: Payer: Self-pay | Admitting: Family Medicine

## 2022-04-19 DIAGNOSIS — J449 Chronic obstructive pulmonary disease, unspecified: Secondary | ICD-10-CM

## 2022-04-19 DIAGNOSIS — E1122 Type 2 diabetes mellitus with diabetic chronic kidney disease: Secondary | ICD-10-CM

## 2022-04-19 DIAGNOSIS — I2510 Atherosclerotic heart disease of native coronary artery without angina pectoris: Secondary | ICD-10-CM

## 2022-04-27 ENCOUNTER — Other Ambulatory Visit: Payer: Self-pay

## 2022-04-27 ENCOUNTER — Other Ambulatory Visit: Payer: Self-pay | Admitting: Cardiovascular Disease

## 2022-04-27 ENCOUNTER — Encounter: Payer: Self-pay | Admitting: Cardiovascular Disease

## 2022-04-27 ENCOUNTER — Encounter: Payer: Self-pay | Admitting: Family Medicine

## 2022-04-27 ENCOUNTER — Telehealth: Payer: Self-pay | Admitting: Gastroenterology

## 2022-04-27 MED ORDER — METOPROLOL TARTRATE 100 MG PO TABS: 100.0000 mg | ORAL_TABLET | Freq: Two times a day (BID) | ORAL | 0 refills | Status: DC

## 2022-04-27 MED ORDER — CLOPIDOGREL BISULFATE 75 MG PO TABS: 75.0000 mg | ORAL_TABLET | Freq: Every day | ORAL | 3 refills | Status: DC

## 2022-04-27 MED ORDER — ROSUVASTATIN CALCIUM 20 MG PO TABS: 20.0000 mg | ORAL_TABLET | Freq: Every day | ORAL | 3 refills | Status: DC

## 2022-04-27 MED ORDER — LISINOPRIL 20 MG PO TABS: 20.0000 mg | ORAL_TABLET | Freq: Every day | ORAL | 3 refills | Status: DC

## 2022-04-27 MED ORDER — EZETIMIBE 10 MG PO TABS: 10.0000 mg | ORAL_TABLET | Freq: Every day | ORAL | 0 refills | Status: DC

## 2022-04-27 NOTE — Telephone Encounter (Addendum)
Pt would like to schedule colonoscopy received letter

## 2022-04-27 NOTE — Progress Notes (Signed)
Refilled patient's medications from MyChart message request   Floreen Comber,  Please refill meds through [facility].   Thank you!   Con Memos,   Good afternoon we can certainly refill your medications for you. Please let us know which pharmacy you will be using for the refills please and thank you.   Very respectfully, Floreen Comber, RN   Dr. [name], I need all of my current medications refilled.  I have an appointment scheduled, but it is not until May.  I do not have enough supplies to last until then and most of them have run out of refills.   Thank you! Hannah Kirby

## 2022-04-27 NOTE — Telephone Encounter (Signed)
I have looked it up. Patient is due for annual visit after 05/19/2022.

## 2022-04-28 ENCOUNTER — Telehealth: Payer: Self-pay

## 2022-04-28 DIAGNOSIS — J984 Other disorders of lung: Secondary | ICD-10-CM

## 2022-04-28 MED ORDER — TRELEGY ELLIPTA 100-62.5-25 MCG/ACT IN AEPB: 1.0000 | INHALATION_SPRAY | Freq: Every day | RESPIRATORY_TRACT | 3 refills | Status: DC

## 2022-04-28 NOTE — Progress Notes (Signed)
  Chronic Care Management   Note  04/28/2022 Name: ARSHIA NUNLEY MRN: [id number] DOB: 12/18/1945  Roland Earl Ellzey is a 77 y.o. year old female who is a primary care patient of Caryn Section, Kirstie Peri, MD. I reached out to Roland Earl Rocque by phone today in response to a referral sent by Ms. Roland Earl Tomlin's PCP.  Ms. Stanbro was given information about Chronic Care Management services today including:  CCM service includes personalized support from designated clinical staff supervised by the physician, including individualized plan of care and coordination with other care providers 24/7 contact phone numbers for assistance for urgent and routine care needs. Service will only be billed when office clinical staff spend 20 minutes or more in a month to coordinate care. Only one practitioner may furnish and bill the service in a calendar month. The patient may stop CCM services at amy time (effective at the end of the month) by phone call to the office staff. The patient will be responsible for cost sharing (co-pay) or up to 20% of the service fee (after annual deductible is met)  Ms. Criselda Doda Mireles  agreedto scheduling an appointment with the CCM RN Case Manager   Follow up plan: Patient agreed to scheduled appointment with RN Case Manager on 05/02/2022(date/time).   Noreene Larsson, [place], [place] [address] Direct Dial: [phone] [email or web address]

## 2022-05-02 ENCOUNTER — Ambulatory Visit: Payer: Medicare Other

## 2022-05-02 DIAGNOSIS — E1122 Type 2 diabetes mellitus with diabetic chronic kidney disease: Secondary | ICD-10-CM

## 2022-05-02 DIAGNOSIS — J449 Chronic obstructive pulmonary disease, unspecified: Secondary | ICD-10-CM

## 2022-05-02 DIAGNOSIS — I2510 Atherosclerotic heart disease of native coronary artery without angina pectoris: Secondary | ICD-10-CM

## 2022-05-02 NOTE — Patient Instructions (Signed)
Thank you for allowing the Chronic Care Management team to participate in your care. It was great speaking with you!  Our next outreach is scheduled for June 13, 2022 at 1100. Please do not hesitate to call if you require assistance prior to our next outreach. Please call the care guide team at [phone] if you need to cancel or reschedule your appointment.   Following is a copy of the CCM Program Consent:  CCM service includes personalized support from designated clinical staff supervised by the physician, including individualized plan of care and coordination with other care providers 24/7 contact phone numbers for assistance for urgent and routine care needs. Service will only be billed when office clinical staff spend 20 minutes or more in a month to coordinate care. Only one practitioner may furnish and bill the service in a calendar month. The patient may stop CCM services at amy time (effective at the end of the month) by phone call to the office staff. The patient will be responsible for cost sharing (co-pay) or up to 20% of the service fee (after annual deductible is met)  Following is a copy of your full provider care plan:    Hannah Kirby verbalizes understanding of instructions and care plan provided today and agrees to view in [place].   A member of the care management team will follow up within the next month.     Horris Latino RN Care Manager/Chronic Care Management [phone]

## 2022-05-06 ENCOUNTER — Other Ambulatory Visit: Payer: Self-pay | Admitting: Family Medicine

## 2022-05-06 ENCOUNTER — Ambulatory Visit: Payer: Medicare Other

## 2022-05-06 DIAGNOSIS — M48061 Spinal stenosis, lumbar region without neurogenic claudication: Secondary | ICD-10-CM

## 2022-05-06 DIAGNOSIS — I5032 Chronic diastolic (congestive) heart failure: Secondary | ICD-10-CM

## 2022-05-06 DIAGNOSIS — Z794 Long term (current) use of insulin: Secondary | ICD-10-CM

## 2022-05-06 MED ORDER — HYDROCODONE-ACETAMINOPHEN 5-325 MG PO TABS: 1.0000 | ORAL_TABLET | Freq: Four times a day (QID) | ORAL | 0 refills | Status: DC | PRN

## 2022-05-06 NOTE — Progress Notes (Signed)
Care Management & Coordination Services Pharmacy Note  05/06/2022 Name:  Hannah Kirby MRN:  [id number] DOB:  01/21/1946  Summary: Patient presents for follow-up consult.   Patient had one fall, did not have it evaluated.   Recommendations/Changes made from today's visit:   Follow up plan: CPP follow-up 1 month   Subjective: Hannah Kirby is an 77 y.o. year old female who is a primary patient of Caryn Section, Kirstie Peri, MD.  The care coordination team was consulted for assistance with disease management and care coordination needs.    Engaged with patient face to face for follow up visit.  Recent office visits: 03/09/2022 Kirke Shaggy LPN (PCP Office) Medicare Wellness completed, No medication Changes noted,No orders placed 11/16/2021 Dr. Alba Cory MD (PCP) No medication Changes noted, No orders placed 10/08/2021 Neldon Labella RN (PCP office) No medication changes noted  Recent consult visits:  04/13/22: Patient presented to Dr. Holley Raring (Nephrology). Farxiga 10 mg daily.  03/18/2022 Dr. Honor Junes MD (Endocrinology) Restart her Ozmepic 0.5 mg weekly, return to clinic in 6 months   02/11/2022 Eulogio Ditch NP (Vascular Surgery) No Medication changes noted 02/04/2022 Luella Cook PA (Neurology) Start gabapentin 100 to 200 mg as needed , No orders placed, Follow-up in 6 to 12 months  12/21/2021 Dr. Holley Raring MD (Nephrology)No Medication changes noted, return in 4 months 11/24/2021 Dr. Harlow Mares MD Rosanne Gutting) Unable to see note  Hospital visits: None in previous 6 months   Objective:  Lab Results  Component Value Date   CREATININE 1.80 (H) 04/08/2022   BUN 23 04/08/2022   EGFR 29 (L) 04/08/2022   GFRNONAA 47 (L) 03/31/2020   GFRAA 55 (L) 03/31/2020   NA 141 04/08/2022   K 4.4 04/08/2022   CALCIUM 11.4 (H) 04/08/2022   CO2 26 04/08/2022   GLUCOSE 112 (H) 04/08/2022    Lab Results  Component Value Date/Time   HGBA1C 6.5 03/19/2021 12:00 AM   HGBA1C 8.2 (A)  11/20/2019 10:36 AM   HGBA1C 8.0 (H) 08/01/2019 12:15 PM   HGBA1C 6.9 10/09/2017 12:00 AM   MICROALBUR 127 06/21/2016 12:00 AM    Last diabetic Eye exam:  Lab Results  Component Value Date/Time   HMDIABEYEEXA No Retinopathy 04/20/2021 12:00 AM    Last diabetic Foot exam: No results found for: "HMDIABFOOTEX"   Lab Results  Component Value Date   CHOL 160 04/08/2022   HDL 62 04/08/2022   LDLCALC 73 04/08/2022   TRIG 148 04/08/2022   CHOLHDL 2.6 04/08/2022       Latest Ref Rng & Units 04/08/2022   11:03 AM 08/04/2020   11:27 AM 03/31/2020   10:34 AM  Hepatic Function  Total Protein 6.0 - 8.5 g/dL 6.8  7.1    Albumin 3.8 - 4.8 g/dL 4.1  4.4  4.1   AST 0 - 40 IU/L 22  20    ALT 0 - 32 IU/L 16  19    Alk Phosphatase 44 - 121 IU/L 133  125    Total Bilirubin 0.0 - 1.2 mg/dL 0.4  0.3      Lab Results  Component Value Date/Time   TSH 2.910 04/12/2021 09:50 AM   TSH 2.340 08/04/2020 11:27 AM       Latest Ref Rng & Units 04/12/2021    9:50 AM 08/04/2020   11:27 AM 03/31/2020   10:34 AM  CBC  WBC 3.4 - 10.8 x10E3/uL 9.2  9.6  9.0   Hemoglobin 11.1 - 15.9 g/dL 15.9  16.9  16.4  Hematocrit 34.0 - 46.6 % 46.8  49.9  47.7   Platelets 150 - 450 x10E3/uL 223  225  232     Lab Results  Component Value Date/Time   VD25OH 43.6 04/12/2021 09:50 AM   VITAMINB12 >2000 (H) 04/12/2021 09:50 AM    Clinical ASCVD: Yes  The ASCVD Risk score (Arnett DK, et al., 2019) failed to calculate for the following reasons:   The patient has a prior MI or stroke diagnosis       05/02/2022    1:12 PM 03/09/2022    1:13 PM 03/08/2021    1:15 PM  Depression screen PHQ 2/9  Decreased Interest 0  0  Down, Depressed, Hopeless 1 1 0  PHQ - 2 Score 1 1 0  Altered sleeping  1   Tired, decreased energy  1   Trouble concentrating  1   PHQ-9 Score  4      Social History   Tobacco Use  Smoking Status Every Day   Packs/day: 1.00   Years: 40.00   Additional pack years: 0.00   Total pack years:  40.00   Types: Cigarettes  Smokeless Tobacco Never   BP Readings from Last 3 Encounters:  02/11/22 (!) 162/95  05/18/21 (!) 148/96  05/13/21 134/80   Pulse Readings from Last 3 Encounters:  02/11/22 72  05/18/21 86  05/13/21 76   Wt Readings from Last 3 Encounters:  03/09/22 159 lb (72.1 kg)  02/11/22 159 lb (72.1 kg)  05/18/21 166 lb (75.3 kg)   BMI Readings from Last 3 Encounters:  03/09/22 26.46 kg/m  02/11/22 26.46 kg/m  05/18/21 26.79 kg/m    Allergies  Allergen Reactions   Spironolactone Shortness Of Breath   Sulfonamide Derivatives Other (See Comments)    Last taken as a child; made her mouth break out   Other Other (See Comments)    Mouth blisters   Sulfa Antibiotics Other (See Comments)    Mouth blisters Welts on mouth    Codeine Nausea And Vomiting and Other (See Comments)    Nausea   Prednisone Palpitations and Other (See Comments)    Made her legs "feel weird."    Medications Reviewed Today     Reviewed by Neldon Labella, RN (Registered Nurse) on 05/02/22 at 1110  Med List Status: <None>   Medication Order Taking? Sig Documenting Provider Last Dose Status Informant  acetaminophen (TYLENOL) 500 MG tablet [id number] Yes Take 500 mg by mouth every 6 (six) hours as needed. [provider] Taking Active Self  albuterol (VENTOLIN HFA) 108 (90 Base) MCG/ACT inhaler [id number] Yes Inhale 2 puffs into the lungs every 4 (four) hours as needed for wheezing or shortness of breath. Birdie Sons, MD Taking Active   aspirin (ASPIR-81) 81 MG EC tablet [id number] Yes Take 81 mg by mouth daily. [provider] Taking Active Self  clopidogrel (PLAVIX) 75 MG tablet [id number] Yes Take 1 tablet (75 mg total) by mouth daily. Minna Merritts, MD Taking Active   cyanocobalamin 1000 MCG tablet [id number]  Take 1,000 mcg by mouth daily. [provider]  Active Self  ezetimibe (ZETIA) 10 MG tablet [id number]  Take 1 tablet (10 mg total) by mouth  daily. Minna Merritts, MD  Active            Med Note Darin Engels May 02, 2022 11:07 AM) Needs new order  Fluticasone-Umeclidin-Vilant (TRELEGY ELLIPTA) 100-62.5-25 MCG/ACT AEPB [id number] Yes Inhale 1 puff into the  lungs daily. Birdie Sons, MD Taking Active            Med Note Minerva Ends, Turner Daniels May 02, 2022 11:06 AM) Pending delivery  HYDROcodone-acetaminophen (NORCO/VICODIN) 5-325 MG tablet [id number] Yes Take 1 tablet by mouth every 6 (six) hours as needed. Birdie Sons, MD Taking Active   insulin glargine (LANTUS) 100 UNIT/ML injection [id number] Yes Inject 22 Units into the skin daily.  [provider] Taking Active Self           Med Note Janan Ridge   Tue Aug 06, 2019  9:01 AM)    lisinopril (ZESTRIL) 20 MG tablet [id number] Yes Take 1 tablet (20 mg total) by mouth daily. Minna Merritts, MD Taking Active   metoprolol tartrate (LOPRESSOR) 100 MG tablet [id number] Yes Take 1 tablet (100 mg total) by mouth 2 (two) times daily. Minna Merritts, MD Taking Active   Multiple Vitamins-Minerals (DAILY MULTI) TABS [id number] Yes Take 1 tablet by mouth daily. [provider] Taking Active Self  nitroGLYCERIN (NITROSTAT) 0.4 MG SL tablet [id number]  Place 1 tablet (0.4 mg total) under the tongue every 5 (five) minutes as needed. Minna Merritts, MD  Active   Omega-3 Fatty Acids (FISH OIL) 1000 MG CAPS [id number] Yes Take 2 capsules by mouth in the morning and at bedtime.  [provider] Taking Active Self  ondansetron (ZOFRAN ODT) 4 MG disintegrating tablet [id number]  Take 1 tablet (4 mg total) by mouth every 8 (eight) hours as needed for nausea or vomiting. Rudene Re, MD  Active Self           Med Note Janan Ridge   Tue Aug 06, 2019  9:01 AM)    RABEprazole (ACIPHEX) 20 MG tablet [id number] Yes Take 1 tablet (20 mg total) by mouth daily. 15 Mins. before evening meal Lucilla Lame, MD Taking Active   rosuvastatin  (CRESTOR) 20 MG tablet [id number] Yes Take 1 tablet (20 mg total) by mouth daily. Minna Merritts, MD Taking Active   Semaglutide,0.25 or 0.5MG /DOS, 2 MG/1.5ML [place] Aid [id number] Yes Inject 0.25 mg into the skin once a week. [provider] Taking Active            Med Note Minerva Ends, Turner Daniels May 02, 2022 11:10 AM) Reports taking 0.5 mg weekly  sucralfate (CARAFATE) 1 g tablet [id number] Yes Take 1 tablet (1 g total) by mouth 2 (two) times daily. 15 Minutes before evening meal and at bed time Lucilla Lame, MD Taking Active             SDOH:  (Social Determinants of Health) assessments and interventions performed: Yes SDOH Interventions    Flowsheet Row Chronic Care Management from 05/02/2022 in [facility] Coordination from 04/08/2022 in [facility] from 03/09/2022 in [facility] from 03/08/2021 in [facility] Management from 04/01/2020 in [facility] from 02/18/2020 in [facility]  SDOH Interventions        Food Insecurity Interventions Intervention Not Indicated Intervention Not Indicated Intervention Not Indicated Intervention Not Indicated Intervention Not Indicated --  Housing Interventions Intervention Not Indicated -- Intervention Not Indicated Intervention Not Indicated -- --  Transportation Interventions Intervention Not Indicated Intervention Not Indicated Intervention Not Indicated Intervention Not Indicated Other (Comment)  [Completed outreach with [facility] Care Guide regarding transportation  resources.] --  Utilities Interventions Intervention Not Indicated -- Intervention Not Indicated -- -- --  Alcohol Usage Interventions -- -- Intervention Not Indicated (Score <7) -- -- --  Depression Interventions/Treatment  Medication -- Medication -- -- --  Financial Strain  Interventions Intervention Not Indicated Intervention Not Indicated Intervention Not Indicated Intervention Not Indicated -- --  Physical Activity Interventions Other (Comments)  [Ability to exercise limited d/t joint and back pain] -- Patient Refused Patient Refused -- Patient Refused  Stress Interventions -- -- Intervention Not Indicated Intervention Not Indicated -- --  Social Connections Interventions Intervention Not Indicated -- Intervention Not Indicated Intervention Not Indicated -- --       Medication Assistance: None required.  Patient affirms current coverage meets needs.  Medication Access: Within the past 30 days, how often has patient missed a dose of medication? None Is a pillbox or other method used to improve adherence? No  Factors that may affect medication adherence? no barriers identified Are meds synced by current pharmacy? No  Are meds delivered by current pharmacy? Yes  Does patient experience delays in picking up medications due to transportation concerns? No   [facility] Reviewed: Is patient disadvantaged to use [facility]?: Yes  Current Rx insurance plan: [place] Name and location of Current pharmacy:  [place] [facility] [place], [place] [address] [facility] [address] [place] [address] Phone: [phone] Fax: [phone]  [facility] [place] [id number] [address], [place] [address] Phone: [phone] Fax: [phone]  [place], [place] - [place] [address] [place] [address] Phone: [phone] Fax: [phone]  [facility] services reviewed with patient today?: No  Patient requests to transfer care to [facility]?: No  Reason patient declined to change pharmacies: Disadvantaged due to insurance/mail order  Compliance/Adherence/Medication fill history: Care Gaps: Annual  wellness visit in last year? Yes Last completed 03/09/2022 Diabetic Kidney evaluation- Urine ACR Dtap Vaccine Shingrix Vaccine Lung Cancer Screening - Last completed 08/08/2016 Diabetic Kidney evaluation- eGFR measurement- Last completed 08/04/2020 Hemoglobin A1C COVID-19 Vaccine  Star-Rating Drugs: Medication: Lisinopril 20 mg - Not Listed in chart as patient receives from [facility] Medication: Rosuvastatin 20 mg- Not Listed in chart as patient receives from [facility] Medication: Ozempic 0.5 mg- Not Listed in chart as patient receives from [facility]  Assessment/Plan  Heart Failure (Goal: manage symptoms and prevent exacerbations) -Controlled -Last ejection fraction: 60-65% (Date: Jan 2022) -HF type: Diastolic -NYHA Class: III (marked limitation of activity) -AHA HF Stage: C (Heart disease and symptoms present) -Current treatment: Lisinopril 20 mg daily  Metoprolol Tartrate 100 mg twice daily  -Medications previously tried: NA  -Current home BP/HR readings: Monitoring blood pressure sporadically.  -Current home daily weights: NA -Continue current medications   Hyperlipidemia: (LDL goal < 55) -Uncontrolled -History of AAA, TIA, NSTEMI 2022  -Current treatment: Ezetimibe 10 mg daily  Fish Oil 1000 mg 2 caps daily  Rosuvastatin 20 mg daily -Current treatment: Aspirin 81 mg daily  Clopidogrel 75 mg daily   -Medications previously tried: NA  -Cholesterol elevated but not recently checked. Patient near renal cutoff for dose reducing rosuvastatin. Would not titrate at this time. If not at goal would consider PCSK9 inhibitor in this patient.  -Recommended to continue current medication  Diabetes (A1c goal <8%) -Controlled -Current medications: Lantus 22 units daily  Ozempic 0.25 mg weekly  -  Medications previously tried: Jardiance, Metformin   -Current home glucose readings fasting glucose: 80-110s, up to 140s if she has had coffee  -Denies hypoglycemic/hyperglycemic  symptoms -Discussed benefits of SGLT2 inhibitor and CGM in this patient. Patient wanted to defer starting CGM today.  -Recommended to continue current medication  COPD (Goal: control symptoms and prevent exacerbations) -Controlled -Current treatment  Albuterol HFA  Breo 1 puff daily  Spriva 1 capsule daily  -Medications previously tried: NA  -Current COPD Classification:  B (high sx, 0-1 moderate exacerbations, no hospitalizations) -CAT score: 18 (medium burden)  -Pulmonary function testing: None noted  -Exacerbations requiring treatment in last 6 months: NA -Patient reports consistent use of maintenance inhaler -Frequency of rescue inhaler use: 1-2 times daily    Chronic Kidney Disease Stage 3b  -All medications assessed for renal dosing and appropriateness in chronic kidney disease. -Recommended to continue current medication   Junius Argyle, PharmD, [place], CPP  Clinical Pharmacist Practitioner  [facility] [phone]

## 2022-05-09 DIAGNOSIS — Z961 Presence of intraocular lens: Secondary | ICD-10-CM | POA: Diagnosis not present

## 2022-05-09 DIAGNOSIS — E119 Type 2 diabetes mellitus without complications: Secondary | ICD-10-CM | POA: Diagnosis not present

## 2022-05-09 DIAGNOSIS — H16223 Keratoconjunctivitis sicca, not specified as Sjogren's, bilateral: Secondary | ICD-10-CM | POA: Diagnosis not present

## 2022-05-09 LAB — HM DIABETES EYE EXAM

## 2022-05-17 ENCOUNTER — Ambulatory Visit: Payer: Self-pay

## 2022-05-17 NOTE — Chronic Care Management (AMB) (Signed)
  Chronic Care Management   CCM RN Visit Note  05/17/2022 Name: YULITZA AFZALI MRN: [id number] DOB: [birth year]  Subjective: Jaise Buchan Roebuck is a 77 y.o. year old female who is a primary care patient of Sherrie Mustache, Demetrios Isaacs, MD. The patient was referred to the Chronic Care Management team for assistance with care management needs subsequent to provider initiation of CCM services and plan of care.    Today's Visit:  Engaged with patient by telephone for follow up visit.

## 2022-05-18 ENCOUNTER — Ambulatory Visit
Admission: RE | Admit: 2022-05-18 | Discharge: 2022-05-18 | Disposition: A | Discharge status: Home / Self Care | Payer: Medicare Other | Attending: Family Medicine | Admitting: Family Medicine

## 2022-05-18 ENCOUNTER — Encounter: Payer: Self-pay | Admitting: Family Medicine

## 2022-05-18 ENCOUNTER — Ambulatory Visit
Admission: RE | Admit: 2022-05-18 | Discharge: 2022-05-18 | Disposition: A | Discharge status: Home / Self Care | Payer: Medicare Other | Source: Ambulatory Visit | Attending: Family Medicine | Admitting: Family Medicine

## 2022-05-18 ENCOUNTER — Ambulatory Visit (INDEPENDENT_AMBULATORY_CARE_PROVIDER_SITE_OTHER): Payer: Medicare Other | Admitting: Family Medicine

## 2022-05-18 VITALS — BP 154/83 | HR 77 | Temp 98.0°F | Resp 16 | Wt 163.3 lb

## 2022-05-18 DIAGNOSIS — M47816 Spondylosis without myelopathy or radiculopathy, lumbar region: Secondary | ICD-10-CM | POA: Diagnosis not present

## 2022-05-18 DIAGNOSIS — M545 Low back pain, unspecified: Secondary | ICD-10-CM | POA: Diagnosis not present

## 2022-05-18 DIAGNOSIS — M4126 Other idiopathic scoliosis, lumbar region: Secondary | ICD-10-CM | POA: Diagnosis not present

## 2022-05-18 MED ORDER — PREDNISONE 10 MG PO TABS: ORAL_TABLET | ORAL | 0 refills | Status: AC

## 2022-05-18 NOTE — Progress Notes (Signed)
I,Joseline E Rosas,acting as a scribe for Lelon Huh, MD.,have documented all relevant documentation on the behalf of Lelon Huh, MD,as directed by  Lelon Huh, MD while in the presence of Lelon Huh, MD.   Established patient visit   Patient: Hannah Kirby   DOB: 1945/03/22   77 y.o. Female  MRN: [id number] Visit Date: 05/18/2022  Today's healthcare provider: Lelon Huh, MD   Chief Complaint  Patient presents with   Back Pain   Subjective    HPI  This is a new problem.  There was not an injury that may have caused the pain.  Onset: 5-4 weeks ago.  The problem has been unchanged since onset, but waxes and wanes.  Location: Left lower back pain.  Radiating to: To all her lower back and to her right and left side (flank).  Severity of the pain is severe.  Pain occurs constantly.  Symptoms worse in : All day.  Aggravating factors: movements.  Symptoms are relieved by nothing.  Treatments: prescription pain relievers.  Treatment provided no relief.  Abdominal Pain: Absent.  Bowel incontinence: Absent.  Chest pain: Absent.  Dysuria: Absent.  Fever: Absent.  Headaches: Absent.  Weakness in leg: Absent.  Pelvic pain: Absent.  Tingling in lower extremities: Absent.  Urinary incontinence: Absent. Patient also reports that she fell 3 weeks ago on her left side due to gait/balance problems. 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  Medications: Outpatient Medications Prior to Visit  Medication Sig   acetaminophen (TYLENOL) 500 MG tablet Take 500 mg by mouth every 6 (six) hours as needed.   albuterol (VENTOLIN HFA) 108 (90 Base) MCG/ACT inhaler Inhale 2 puffs into the lungs every 4 (four) hours as needed for wheezing or shortness of breath.   aspirin (ASPIR-81) 81 MG EC tablet Take 81 mg by mouth daily.   clopidogrel (PLAVIX) 75 MG tablet Take 1 tablet (75 mg total) by mouth daily.   cyanocobalamin  1000 MCG tablet Take 1,000 mcg by mouth daily.   ezetimibe (ZETIA) 10 MG tablet Take 1 tablet (10 mg total) by mouth daily.   Fluticasone-Umeclidin-Vilant (TRELEGY ELLIPTA) 100-62.5-25 MCG/ACT AEPB Inhale 1 puff into the lungs daily.   HYDROcodone-acetaminophen (NORCO/VICODIN) 5-325 MG tablet Take 1 tablet by mouth every 6 (six) hours as needed.   insulin glargine (LANTUS) 100 UNIT/ML injection Inject 22 Units into the skin daily.    lisinopril (ZESTRIL) 20 MG tablet Take 1 tablet (20 mg total) by mouth daily.   metoprolol tartrate (LOPRESSOR) 100 MG tablet Take 1 tablet (100 mg total) by mouth 2 (two) times daily.   Multiple Vitamins-Minerals (DAILY MULTI) TABS Take 1 tablet by mouth daily.   nitroGLYCERIN (NITROSTAT) 0.4 MG SL tablet Place 1 tablet (0.4 mg total) under the tongue every 5 (five) minutes as needed.   Omega-3 Fatty Acids (FISH OIL) 1000 MG CAPS Take 2 capsules by mouth in the morning and at bedtime.    ondansetron (ZOFRAN ODT) 4 MG disintegrating tablet Take 1 tablet (4 mg total) by mouth every 8 (eight) hours as needed for nausea or vomiting.   RABEprazole (ACIPHEX) 20 MG tablet Take 1 tablet (20 mg total) by mouth daily. 15 Mins. before evening meal   rosuvastatin (CRESTOR) 20 MG tablet Take 1 tablet (20 mg total) by mouth daily.   Semaglutide,0.25 or 0.5MG /DOS, 2 MG/1.5ML SOPN Inject 0.25 mg into the skin once a week.   sucralfate (CARAFATE)  1 g tablet Take 1 tablet (1 g total) by mouth 2 (two) times daily. 15 Minutes before evening meal and at bed time   No facility-administered medications prior to visit.    Review of Systems  Constitutional:  Negative for appetite change, chills, fatigue and fever.  Respiratory:  Negative for chest tightness and shortness of breath.   Cardiovascular:  Negative for chest pain and palpitations.  Gastrointestinal:  Negative for abdominal pain, nausea and vomiting.  Genitourinary:  Negative for difficulty urinating, dysuria and urgency.   Neurological:  Negative for dizziness and weakness.       Objective    BP (!) 154/83 (BP Location: Left Arm, Patient Position: Sitting, Cuff Size: Normal)   Pulse 77   Temp 98 F (36.7 C) (Oral)   Resp 16   Wt 163 lb 4.8 oz (74.1 kg)   SpO2 97%   BMI 27.17 kg/m    Physical Exam   Point tenderness over bilateral SI joints, R>L. No spine tenderness. +5/5 BLE strength.   Patient unable to provide urine sample.   Assessment & Plan     1. 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   - DG Lumbar Spine Complete; Future - predniSONE (DELTASONE) 10 MG tablet; 4 tablets for 2 day, then 3 for 2 day, then 2 for 2 day then 1 for 2 day.  Dispense: 20 tablet; Refill: 0      The entirety of the information documented in the History of Present Illness, Review of Systems and Physical Exam were personally obtained by me. Portions of this information were initially documented by the CMA and reviewed by me for thoroughness and accuracy.     Lelon Huh, MD  [place] [phone] (phone) [phone] (fax)  [place]

## 2022-06-02 ENCOUNTER — Ambulatory Visit: Payer: Self-pay

## 2022-06-02 DIAGNOSIS — M48061 Spinal stenosis, lumbar region without neurogenic claudication: Secondary | ICD-10-CM

## 2022-06-02 MED ORDER — HYDROCODONE-ACETAMINOPHEN 5-325 MG PO TABS: 1.0000 | ORAL_TABLET | Freq: Four times a day (QID) | ORAL | 0 refills | Status: DC | PRN

## 2022-06-02 NOTE — Telephone Encounter (Signed)
Have sent refill for hydrocodone to [facility], but sounds like she needs to go to ER if pain is that severe and medications aren't helping.

## 2022-06-02 NOTE — Telephone Encounter (Signed)
Patient stated she is having severe pain in her back that I running down her left leg. She can hardly walk or move. She has been taking Tylenol around 7am and Hydrocodone around 9:30 with no relief. Please advise patient   Chief Complaint: Low back, radiates down left leg. Hydrocodone "helped a little." Pain 10/10. Asking for advice and refill on Hydrocodone.Declines OV. Symptoms: Above Frequency: 2 month ago Pertinent Negatives: Patient denies  Disposition: [] ED /[] Urgent Care (no appt availability in office) / [] Appointment(In office/virtual)/ []  [facility]/ [] Home Care/ [] Refused Recommended Disposition /[]  Mobile Bus/ [x]  Follow-up with PCP Additional Notes: States she did not get imaging results. Please advise pt.  Answer Assessment - Initial Assessment Questions 1. ONSET: "When did the pain begin?"      1 month ago 2. LOCATION: "Where does it hurt?" (upper, mid or lower back)     Low 3. SEVERITY: "How bad is the pain?"  (e.g., Scale 1-10; mild, moderate, or severe)   - MILD (1-3): Doesn't interfere with normal activities.    - MODERATE (4-7): Interferes with normal activities or awakens from sleep.    - SEVERE (8-10): Excruciating pain, unable to do any normal activities.      Now - 10 4. PATTERN: "Is the pain constant?" (e.g., yes, no; constant, intermittent)      Constant 5. RADIATION: "Does the pain shoot into your legs or somewhere else?"     Left leg 6. CAUSE:  "What do you think is causing the back pain?"      Unsure 7. BACK OVERUSE:  "Any recent lifting of heavy objects, strenuous work or exercise?"     No 8. MEDICINES: "What have you taken so far for the pain?" (e.g., nothing, acetaminophen, NSAIDS)     Hydrocodone 9. NEUROLOGIC SYMPTOMS: "Do you have any weakness, numbness, or problems with bowel/bladder control?"     No 10. OTHER SYMPTOMS: "Do you have any other symptoms?" (e.g., fever, abdomen pain, burning with urination, blood in urine)        No 11. PREGNANCY: "Is there any chance you are pregnant?" "When was your last menstrual period?"       No  Protocols used: Back Pain-A-AH

## 2022-06-02 NOTE — Telephone Encounter (Signed)
Advised 

## 2022-06-06 ENCOUNTER — Ambulatory Visit: Payer: Medicare Other

## 2022-06-06 DIAGNOSIS — M5416 Radiculopathy, lumbar region: Secondary | ICD-10-CM

## 2022-06-06 DIAGNOSIS — J984 Other disorders of lung: Secondary | ICD-10-CM

## 2022-06-06 NOTE — Progress Notes (Unsigned)
Care Management & Coordination Services Pharmacy Note  06/06/2022 Name:  Hannah Kirby MRN:  [id number] DOB:  Nov 18, 1945  Summary: Patient presents for follow-up consult.   Recommendations/Changes made from today's visit: Continue current medications  Follow up plan: CPP follow-up 6 month   Subjective: Hannah Kirby is an 77 y.o. year old female who is a primary patient of Fisher, Demetrios Isaacs, MD.  The care coordination team was consulted for assistance with disease management and care coordination needs.    Engaged with patient face to face for follow up visit.  Recent office visits: 05/18/22: Patient presented to Dr. Sherrie Mustache for follow-up.  03/09/2022 Kennedy Bucker LPN (PCP Office) Medicare Wellness completed, No medication Changes noted,No orders placed 11/16/2021 Dr. Neita Garnet MD (PCP) No medication Changes noted, No orders placed 10/08/2021 Juanell Fairly RN (PCP office) No medication changes noted  Recent consult visits:  04/13/22: Patient presented to Dr. Cherylann Ratel (Nephrology). Farxiga 10 mg daily.  03/18/2022 Dr. Gershon Crane MD (Endocrinology) Restart her Ozmepic 0.5 mg weekly, return to clinic in 6 months   02/11/2022 Sheppard Plumber NP (Vascular Surgery) No Medication changes noted 02/04/2022 Nilda Calamity PA (Neurology) Start gabapentin 100 to 200 mg as needed , No orders placed, Follow-up in 6 to 12 months  12/21/2021 Dr. Cherylann Ratel MD (Nephrology)No Medication changes noted, return in 4 months 11/24/2021 Dr. Odis Luster MD Raechel Chute) Unable to see note  Hospital visits: None in previous 6 months   Objective:  Lab Results  Component Value Date   CREATININE 1.80 (H) 04/08/2022   BUN 23 04/08/2022   EGFR 29 (L) 04/08/2022   GFRNONAA 47 (L) 03/31/2020   GFRAA 55 (L) 03/31/2020   NA 141 04/08/2022   K 4.4 04/08/2022   CALCIUM 11.4 (H) 04/08/2022   CO2 26 04/08/2022   GLUCOSE 112 (H) 04/08/2022    Lab Results  Component Value Date/Time   HGBA1C 6.9 03/18/2022  12:00 AM   HGBA1C 6.5 03/19/2021 12:00 AM   MICROALBUR 127 06/21/2016 12:00 AM    Last diabetic Eye exam:  Lab Results  Component Value Date/Time   HMDIABEYEEXA No Retinopathy 05/09/2022 12:00 AM    Last diabetic Foot exam: No results found for: "HMDIABFOOTEX"   Lab Results  Component Value Date   CHOL 160 04/08/2022   HDL 62 04/08/2022   LDLCALC 73 04/08/2022   TRIG 148 04/08/2022   CHOLHDL 2.6 04/08/2022       Latest Ref Rng & Units 04/08/2022   11:03 AM 08/04/2020   11:27 AM 03/31/2020   10:34 AM  Hepatic Function  Total Protein 6.0 - 8.5 g/dL 6.8  7.1    Albumin 3.8 - 4.8 g/dL 4.1  4.4  4.1   AST 0 - 40 IU/L 22  20    ALT 0 - 32 IU/L 16  19    Alk Phosphatase 44 - 121 IU/L 133  125    Total Bilirubin 0.0 - 1.2 mg/dL 0.4  0.3      Lab Results  Component Value Date/Time   TSH 2.910 04/12/2021 09:50 AM   TSH 2.340 08/04/2020 11:27 AM       Latest Ref Rng & Units 04/12/2021    9:50 AM 08/04/2020   11:27 AM 03/31/2020   10:34 AM  CBC  WBC 3.4 - 10.8 x10E3/uL 9.2  9.6  9.0   Hemoglobin 11.1 - 15.9 g/dL 40.9  81.1  91.4   Hematocrit 34.0 - 46.6 % 46.8  49.9  47.7   Platelets 150 -  450 x10E3/uL 223  225  232     Lab Results  Component Value Date/Time   VD25OH 43.6 04/12/2021 09:50 AM   VITAMINB12 >2000 (H) 04/12/2021 09:50 AM    Clinical ASCVD: Yes  The ASCVD Risk score (Arnett DK, et al., 2019) failed to calculate for the following reasons:   The patient has a prior MI or stroke diagnosis       05/18/2022   11:35 AM 05/02/2022    1:12 PM 03/09/2022    1:13 PM  Depression screen PHQ 2/9  Decreased Interest 0 0   Down, Depressed, Hopeless 1 1 1   PHQ - 2 Score 1 1 1   Altered sleeping 1  1  Tired, decreased energy 2  1  Change in appetite 0    Feeling bad or failure about yourself  1    Trouble concentrating 0  1  Moving slowly or fidgety/restless 1    Suicidal thoughts 0    PHQ-9 Score 6  4  Difficult doing work/chores Somewhat difficult       Social  History   Tobacco Use  Smoking Status Every Day   Packs/day: 1.00   Years: 40.00   Additional pack years: 0.00   Total pack years: 40.00   Types: Cigarettes  Smokeless Tobacco Never   BP Readings from Last 3 Encounters:  05/18/22 (!) 154/83  02/11/22 (!) 162/95  05/18/21 (!) 148/96   Pulse Readings from Last 3 Encounters:  05/18/22 77  02/11/22 72  05/18/21 86   Wt Readings from Last 3 Encounters:  05/18/22 163 lb 4.8 oz (74.1 kg)  03/09/22 159 lb (72.1 kg)  02/11/22 159 lb (72.1 kg)   BMI Readings from Last 3 Encounters:  05/18/22 27.17 kg/m  03/09/22 26.46 kg/m  02/11/22 26.46 kg/m    Allergies  Allergen Reactions   Spironolactone Shortness Of Breath   Sulfonamide Derivatives Other (See Comments)    Last taken as a child; made her mouth break out   Other Other (See Comments)    Mouth blisters   Sulfa Antibiotics Other (See Comments)    Mouth blisters Welts on mouth    Codeine Nausea And Vomiting and Other (See Comments)    Nausea   Prednisone Palpitations and Other (See Comments)    Made her legs "feel weird."    Medications Reviewed Today     Reviewed by Juanell Fairly, RN (Registered Nurse) on 05/02/22 at 1110  Med List Status: <None>   Medication Order Taking? Sig Documenting Provider Last Dose Status Informant  acetaminophen (TYLENOL) 500 MG tablet [id number] Yes Take 500 mg by mouth every 6 (six) hours as needed. [provider] Taking Active Self  albuterol (VENTOLIN HFA) 108 (90 Base) MCG/ACT inhaler [id number] Yes Inhale 2 puffs into the lungs every 4 (four) hours as needed for wheezing or shortness of breath. Malva Limes, MD Taking Active   aspirin (ASPIR-81) 81 MG EC tablet [id number] Yes Take 81 mg by mouth daily. [provider] Taking Active Self  clopidogrel (PLAVIX) 75 MG tablet [id number] Yes Take 1 tablet (75 mg total) by mouth daily. Antonieta Iba, MD Taking Active   cyanocobalamin 1000 MCG tablet [id number]  Take  1,000 mcg by mouth daily. [provider]  Active Self  ezetimibe (ZETIA) 10 MG tablet [id number]  Take 1 tablet (10 mg total) by mouth daily. Antonieta Iba, MD  Active            Med Note [facility],  FELECIA N   Mon May 02, 2022 11:07 AM) Needs new order  Fluticasone-Umeclidin-Vilant (TRELEGY ELLIPTA) 100-62.5-25 MCG/ACT AEPB [id number] Yes Inhale 1 puff into the lungs daily. Malva Limes, MD Taking Active            Med Note Constance Haw, Darron Doom May 02, 2022 11:06 AM) Pending delivery  HYDROcodone-acetaminophen (NORCO/VICODIN) 5-325 MG tablet [id number] Yes Take 1 tablet by mouth every 6 (six) hours as needed. Malva Limes, MD Taking Active   insulin glargine (LANTUS) 100 UNIT/ML injection [id number] Yes Inject 22 Units into the skin daily.  [provider] Taking Active Self           Med Note Margrett Rud   Tue Aug 06, 2019  9:01 AM)    lisinopril (ZESTRIL) 20 MG tablet [id number] Yes Take 1 tablet (20 mg total) by mouth daily. Antonieta Iba, MD Taking Active   metoprolol tartrate (LOPRESSOR) 100 MG tablet [id number] Yes Take 1 tablet (100 mg total) by mouth 2 (two) times daily. Antonieta Iba, MD Taking Active   Multiple Vitamins-Minerals (DAILY MULTI) TABS [id number] Yes Take 1 tablet by mouth daily. [provider] Taking Active Self  nitroGLYCERIN (NITROSTAT) 0.4 MG SL tablet [id number]  Place 1 tablet (0.4 mg total) under the tongue every 5 (five) minutes as needed. Antonieta Iba, MD  Active   Omega-3 Fatty Acids (FISH OIL) 1000 MG CAPS [id number] Yes Take 2 capsules by mouth in the morning and at bedtime.  [provider] Taking Active Self  ondansetron (ZOFRAN ODT) 4 MG disintegrating tablet [id number]  Take 1 tablet (4 mg total) by mouth every 8 (eight) hours as needed for nausea or vomiting. Nita Sickle, MD  Active Self           Med Note Margrett Rud   Tue Aug 06, 2019  9:01 AM)    RABEprazole (ACIPHEX) 20 MG  tablet [id number] Yes Take 1 tablet (20 mg total) by mouth daily. 15 Mins. before evening meal Midge Minium, MD Taking Active   rosuvastatin (CRESTOR) 20 MG tablet [id number] Yes Take 1 tablet (20 mg total) by mouth daily. Antonieta Iba, MD Taking Active   Semaglutide,0.25 or 0.5MG /DOS, 2 MG/1.5ML Namon Cirri [id number] Yes Inject 0.25 mg into the skin once a week. [provider] Taking Active            Med Note Constance Haw, Darron Doom May 02, 2022 11:10 AM) Reports taking 0.5 mg weekly  sucralfate (CARAFATE) 1 g tablet [id number] Yes Take 1 tablet (1 g total) by mouth 2 (two) times daily. 15 Minutes before evening meal and at bed time Midge Minium, MD Taking Active             SDOH:  (Social Determinants of Health) assessments and interventions performed: Yes SDOH Interventions    Flowsheet Row Chronic Care Management from 05/02/2022 in [facility] Care Coordination from 04/08/2022 in [facility] Clinical Support from 03/09/2022 in [facility] Clinical Support from 03/08/2021 in [facility] Chronic Care Management from 04/01/2020 in [facility] Clinical Support from 02/18/2020 in [facility]  SDOH Interventions        Food Insecurity Interventions Intervention Not Indicated Intervention Not Indicated Intervention Not Indicated Intervention Not Indicated Intervention Not Indicated --  Housing Interventions Intervention Not Indicated -- Intervention Not Indicated Intervention Not Indicated -- --  Transportation Interventions Intervention Not Indicated Intervention Not Indicated Intervention Not Indicated Intervention Not Indicated Other (Comment)  [Completed outreach with [facility] Care Guide regarding transportation resources.] --  Utilities Interventions Intervention Not Indicated -- Intervention Not Indicated -- -- --  Alcohol Usage Interventions -- --  Intervention Not Indicated (Score <7) -- -- --  Depression Interventions/Treatment  Medication -- Medication -- -- --  Financial Strain Interventions Intervention Not Indicated Intervention Not Indicated Intervention Not Indicated Intervention Not Indicated -- --  Physical Activity Interventions Other (Comments)  [Ability to exercise limited d/t joint and back pain] -- Patient Refused Patient Refused -- Patient Refused  Stress Interventions -- -- Intervention Not Indicated Intervention Not Indicated -- --  Social Connections Interventions Intervention Not Indicated -- Intervention Not Indicated Intervention Not Indicated -- --       Medication Assistance: None required.  Patient affirms current coverage meets needs.  Medication Access: Within the past 30 days, how often has patient missed a dose of medication? None Is a pillbox or other method used to improve adherence? No  Factors that may affect medication adherence? no barriers identified Are meds synced by current pharmacy? No  Are meds delivered by current pharmacy? Yes  Does patient experience delays in picking up medications due to transportation concerns? No   [facility] Reviewed: Is patient disadvantaged to use [facility]?: Yes  Current Rx insurance plan: [place] Name and location of Current pharmacy:  [facility] - [place], [place] - [address] [facility] [address] [place] [address] Phone: [phone] Fax: [phone]  [id number] [place], [place] - [address] [place] [address] Phone: [phone] Fax: [phone]  [id number] [place], [place] - [address] AT [facility] OF [address] [place] [address] Phone: [phone] Fax: [phone]  [facility] services reviewed with patient today?: No  Patient requests to transfer care to [facility]?: No  Reason patient declined  to change pharmacies: Disadvantaged due to insurance/mail order  Compliance/Adherence/Medication fill history: Care Gaps: Annual wellness visit in last year? Yes Last completed 03/09/2022 Diabetic Kidney evaluation- Urine ACR Dtap Vaccine Shingrix Vaccine Lung Cancer Screening - Last completed 08/08/2016 Diabetic Kidney evaluation- eGFR measurement- Last completed 08/04/2020 Hemoglobin A1C COVID-19 Vaccine  Star-Rating Drugs: Medication: Lisinopril 20 mg - Not Listed in chart as patient receives from [facility] Medication: Rosuvastatin 20 mg- Not Listed in chart as patient receives from [facility] Medication: Ozempic 0.5 mg- Not Listed in chart as patient receives from [facility]  Assessment/Plan  Heart Failure (Goal: manage symptoms and prevent exacerbations) -Controlled -Last ejection fraction: 60-65% (Date: Jan 2022) -HF type: Diastolic -NYHA Class: III (marked limitation of activity) -AHA HF Stage: C (Heart disease and symptoms present) -Current treatment: Lisinopril 20 mg daily  Metoprolol Tartrate 100 mg twice daily  -Medications previously tried: NA  -Current home BP/HR readings: Monitoring blood pressure sporadically.  -Current home daily weights: NA -Continue current medications   Hyperlipidemia: (LDL goal < 55) -Uncontrolled -History of AAA, TIA, NSTEMI 2022  -Current treatment: Ezetimibe 10 mg daily  Fish Oil 1000 mg 2 caps daily  Rosuvastatin 20 mg daily -Current treatment: Aspirin 81 mg daily  Clopidogrel 75 mg daily   -Medications previously tried: NA  -Cholesterol elevated but not recently checked. Patient near renal cutoff for dose reducing rosuvastatin. Would not titrate at this time. If not at goal would consider PCSK9 inhibitor in this  patient.  -Recommended to continue current medication  Diabetes (A1c goal <8%) -Controlled -Current medications: Lantus 22 units daily  Ozempic 0.25 mg weekly  -Medications previously tried: Jardiance, Metformin   -Current  home glucose readings fasting glucose: 80-110s, up to 140s if she has had coffee  -Denies hypoglycemic/hyperglycemic symptoms -Discussed benefits of SGLT2 inhibitor and CGM in this patient. Patient wanted to defer starting CGM today.  -Recommended to continue current medication  COPD (Goal: control symptoms and prevent exacerbations) -Controlled -Current treatment  Albuterol HFA  Trelegy 1 puff daily   -Medications previously tried: NA  -Current COPD Classification:  B (high sx, 0-1 moderate exacerbations, no hospitalizations) -MMRC  score: 1-2 -Pulmonary function testing: None noted  -Exacerbations requiring treatment in last 6 months: NA -Patient reports consistent use of maintenance inhaler -Frequency of rescue inhaler use: 1-2 times daily   Chronic Pain (Goal: Minimize pain) -Not ideally controlled -Current treatment  Acetaminophen twice daily  Hydrocodone-acetaminophen  -Medications previously tried: NA  -Patient reports worsening pain.  -Counseled patient to limit APAP to less than 3000 mg daily -Recommended to continue current medication  Chronic Kidney Disease Stage 3b  -All medications assessed for renal dosing and appropriateness in chronic kidney disease. -Recommended to continue current medication  Follow Up Plan: Telephone follow up appointment with care management team member scheduled for: 12/06/2022 at 10:00 AM  Angelena Sole, PharmD, Patsy Baltimore, CPP  Clinical Pharmacist Practitioner  [facility] [phone]

## 2022-06-08 DIAGNOSIS — M5416 Radiculopathy, lumbar region: Secondary | ICD-10-CM | POA: Diagnosis not present

## 2022-06-08 DIAGNOSIS — M25552 Pain in left hip: Secondary | ICD-10-CM | POA: Diagnosis not present

## 2022-06-08 DIAGNOSIS — M545 Low back pain, unspecified: Secondary | ICD-10-CM | POA: Diagnosis not present

## 2022-06-13 ENCOUNTER — Ambulatory Visit: Payer: Medicare Other

## 2022-06-13 NOTE — Chronic Care Management (AMB) (Signed)
  Chronic Care Management   CCM RN Visit Note  06/13/2022 Name: Hannah Kirby MRN: [id number] DOB: 15-Jan-1946  Subjective: Hannah Kirby is a 77 y.o. year old female who is a primary care patient of Sherrie Mustache, Demetrios Isaacs, MD. The patient was referred to the Chronic Care Management team for assistance with care management needs subsequent to provider initiation of CCM services and plan of care.

## 2022-06-14 DIAGNOSIS — M5416 Radiculopathy, lumbar region: Secondary | ICD-10-CM | POA: Diagnosis not present

## 2022-06-21 DIAGNOSIS — M25552 Pain in left hip: Secondary | ICD-10-CM | POA: Diagnosis not present

## 2022-06-21 DIAGNOSIS — M5416 Radiculopathy, lumbar region: Secondary | ICD-10-CM | POA: Diagnosis not present

## 2022-06-21 DIAGNOSIS — M25361 Other instability, right knee: Secondary | ICD-10-CM | POA: Diagnosis not present

## 2022-06-23 ENCOUNTER — Telehealth: Payer: Self-pay

## 2022-06-23 NOTE — Telephone Encounter (Signed)
Copied from CRM [id number]. Topic: General - Inquiry >> Jun 23, 2022  9:10 AM Haroldine Laws wrote: Reason for CRM: pt called saying that Marguerite Olea at [facility] told her to tell Dr. Sherrie Mustache that she could take more of the hydrocodone 5/325.  She said the way the prescription is written she could run out because of the pain she has.  CB#  [phone]

## 2022-07-01 ENCOUNTER — Telehealth: Payer: Self-pay

## 2022-07-01 ENCOUNTER — Telehealth: Payer: Self-pay | Admitting: Family Medicine

## 2022-07-01 NOTE — Progress Notes (Signed)
Care Management & Coordination Services Pharmacy Team  Reason for Encounter: COPD  Contacted patient to discuss COPD disease state.  Spoke with patient on 07/01/2022   Current COPD regimen:  Albuterol HFA  Trelegy 1 puff daily        No data to display         Any recent hospitalizations or ED visits since last visit with CPP? No  Denies COPD symptoms, including Increased shortness of breath , Rescue medicine is not helping, Shortness of breath at rest, Symptoms worse with exercise, Symptoms worse at night, and Wheezing  What recent interventions/DTPs have been made by any provider to improve breathing since last visit: None ID Have you had exacerbation/flare-up since last visit? No What do you do when you are short of breath?  Rescue medication and Rest  Respiratory Devices/Equipment Do you have a nebulizer? No Do you use a Peak Flow Meter? No Do you use a maintenance inhaler? Yes How often do you forget to use your daily inhaler? Never Do you use a rescue inhaler? Yes How often do you use your rescue inhaler?  prn Do you use a spacer with your inhaler? No  Adherence Review: Does the patient have >5 day gap between last estimated fill date for maintenance inhaler medications? No  Back pain medication questions: Patient reports the only issue she is having at the moment is her back pain.Patient states she had a appointment with Marguerite Olea PA-C at [facility] whom inform her to talk with Dr. Sherrie Mustache as she could take more hydrocodone 5/325.  Patient States she reach out to her PCP office on 06/23/2022, but has not heard anything yet.Patient states the way the prescription is written she could run out because of the pain she is having. I inform her I do see the message  in her chart when she called, but the PCP has not responded yet.I inform her the clinical pharmacist is unable to increase hydrocodone since it is a pain medication, but she could call her PCP office again since  it has been a week.Patient verbalized understanding, and states she will call today.  Star Rating Drugs:  Lisinopril 20 mg - Not Listed in chart as patient receives from [facility] Rosuvastatin 20 mg- Not Listed in chart as patient receives from [facility] Ozempic 0.5 mg- Not Listed in chart as patient receives from [facility] Gaps: Annual wellness visit in last year? Yes Last completed 03/09/2022 Dtap Vaccine Shingrix Vaccine Lung Cancer Screening - Last completed 08/08/2016 Diabetic Kidney evaluation- eGFR measurement- Last completed 08/04/2020 COVID-19 Vaccine   If Diabetic: Last eye exam / retinopathy screening:Last completed 07/09/2022 Last diabetic foot exam:Last completed per patient chart note 03/18/2022. Chart Review:  Recent office visits:  06/13/2022 Juanell Fairly RN (PCP Office) No Medication Changes noted  Recent consult visits:  06/21/2022 Gerilyn Pilgrim Darr PA-C ([facility]) No Medication changes noted 06/14/2022 Cam Hai PA-C [facility]) No Medication changes noted 06/08/2022 Dr. Odis Luster MD Raec[facility] Chute) No Medication changes noted  Hospital visits:  None in previous 6 months  Medications: Outpatient Encounter Medications as of 07/01/2022  Medication Sig Note   acetaminophen (TYLENOL) 500 MG tablet Take 500 mg by mouth every 6 (six) hours as needed.    albuterol (VENTOLIN HFA) 108 (90 Base) MCG/ACT inhaler Inhale 2 puffs into the lungs every 4 (four) hours as needed for wheezing or shortness of breath.    aspirin (ASPIR-81) 81 MG EC tablet Take 81 mg by mouth daily.  clopidogrel (PLAVIX) 75 MG tablet Take 1 tablet (75 mg total) by mouth daily.    cyanocobalamin 1000 MCG tablet Take 1,000 mcg by mouth daily.    ezetimibe (ZETIA) 10 MG tablet Take 1 tablet (10 mg total) by mouth daily. 05/02/2022: Needs new order   Fluticasone-Umeclidin-Vilant (TRELEGY ELLIPTA) 100-62.5-25 MCG/ACT AEPB Inhale 1 puff into the lungs daily. 05/02/2022: Pending delivery    HYDROcodone-acetaminophen (NORCO/VICODIN) 5-325 MG tablet Take 1 tablet by mouth every 6 (six) hours as needed.    insulin glargine (LANTUS) 100 UNIT/ML injection Inject 22 Units into the skin daily.     lisinopril (ZESTRIL) 20 MG tablet Take 1 tablet (20 mg total) by mouth daily.    metoprolol tartrate (LOPRESSOR) 100 MG tablet Take 1 tablet (100 mg total) by mouth 2 (two) times daily.    Multiple Vitamins-Minerals (DAILY MULTI) TABS Take 1 tablet by mouth daily.    nitroGLYCERIN (NITROSTAT) 0.4 MG SL tablet Place 1 tablet (0.4 mg total) under the tongue every 5 (five) minutes as needed.    Omega-3 Fatty Acids (FISH OIL) 1000 MG CAPS Take 2 capsules by mouth in the morning and at bedtime.     ondansetron (ZOFRAN ODT) 4 MG disintegrating tablet Take 1 tablet (4 mg total) by mouth every 8 (eight) hours as needed for nausea or vomiting.    RABEprazole (ACIPHEX) 20 MG tablet Take 1 tablet (20 mg total) by mouth daily. 15 Mins. before evening meal    rosuvastatin (CRESTOR) 20 MG tablet Take 1 tablet (20 mg total) by mouth daily.    Semaglutide,0.25 or 0.5MG /DOS, 2 MG/1.5ML SOPN Inject 0.25 mg into the skin once a week. 05/02/2022: Reports taking 0.5 mg weekly   sucralfate (CARAFATE) 1 g tablet Take 1 tablet (1 g total) by mouth 2 (two) times daily. 15 Minutes before evening meal and at bed time    No facility-administered encounter medications on file as of 07/01/2022.    Everlean Cherry Clinical Pharmacist Assistant [phone]

## 2022-07-01 NOTE — Telephone Encounter (Signed)
Patient called stated Barbara Cower Darr from [facility] is requesting to increase the dosage on her hydrocodone 5/325. The dosage she is on right now is not strong enough to stop the pain and because she is taking them so often she will run out before its time for her to get a refill. Please advise

## 2022-07-03 MED ORDER — HYDROCODONE-ACETAMINOPHEN 7.5-325 MG PO TABS: 1.0000 | ORAL_TABLET | Freq: Four times a day (QID) | ORAL | 0 refills | Status: DC | PRN

## 2022-07-03 NOTE — Progress Notes (Deleted)
Date:  07/03/2022   ID:  Hannah Kirby, DOB May 19, 1945, MRN [id number]  Patient Location:  [address] [place] [address]   Provider location:   [facility],  office  PCP:  Malva Limes, MD  Cardiologist:  [facility]   No chief complaint on file.   History of Present Illness:    Hannah Kirby is a 77 y.o. female past medical history of diabetes,  obesity,  Anxiety, panic attacks significantly improved with  weight loss on  Victoza,  coronary artery disease with stenting of her RCA  in January 2008,  long smoking history who continues to smoke,  hyperlipidemia  outbreak of her granulomatous disease in summary 2013,  prednisone for this condition.  History pinched nerve,  on pain medication. Chronic constipation Chronic shortness of breath, COPD Stroke 2018, June 2021, discharged on aspirin Plavix,  previous strokes on MRI who presents for routine followup of her coronary artery disease and hypertension.  Last seen in clinic by myself March 2023 Seen by one of our providers June 2023  In follow-up today reports chronic fatigue Sedentary at baseline Husband with medical issues, does not get out much Goes to grocery store, gets tired Husband having trouble, son helps No regular exercise program  Reports has oxygen and generator, uses these as needed Long history of smoking, chronic bronchitis Denies anginal symptoms  EKG personally reviewed by myself on todays visit Normal sinus rhythm rate 76 bpm no significant ST-T wave changes  Other past medical history reviewed  cardiac catheterization 2020 , stent to the RCA On aspirin Plavix  Lab work reviewed Cholesterol above goal A1C 6.7, down from 8  Cath 03/2020 Severe single-vessel coronary artery disease with 99% distal RCA stenosis. Mild, nonobstructive left coronary artery disease with minimal luminal irregularities involving the LAD and 20-30% ostial  ramus stenosis. Patent proximal/mid RCA stents with mild to moderate in-stent restenosis (~30%). Mildly elevated left ventricular filling pressure (LVEDP 15-20 mmHg). Successful PCI to distal RCA using Resolute Onyx 3.0 x 15 mm drug-eluting stent with 0% residual stenosis and TIMI-3 flow. Right radial artery loop; consider alternative access for future catheterizations.   Recommendations: Remove right femoral artery sheath 2 hours after completion of procedure. Dual antiplatelet therapy with aspirin and clopidogrel for at least 12 months. Aggressive secondary prevention. Obtain echocardiogram. Gentle post catheterization hydration in the setting of chronic kidney disease and mildly elevated left ventricular filling pressure.  Past medical history reviewed  hospital July 2021  with bloody diarrhea, leukocytosis C.diff: Positive Treated with vancomycin  In 2020, torn retina, symptoms started with floaters and vision deficits  had laser treatment, and follow-up with [facility]  CT head: No CT evidence of acute intracranial abnormality. Moderate chronic small vessel ischemic disease with redemonstrated chronic lacunar infarcts in the bilateral basal ganglia, right thalamus and within the pons. Stable, mild generalized parenchymal atrophy.  MRI 1. 8 mm acute lacunar infarct within the right thalamocapsular junction. 2. An additional small acute white matter infarct is questioned within the left corona radiata, as described.  Stress test 11/2017 The study is normal. This is a low risk study. The left ventricular ejection fraction is normal (55-65%).  Previously had side effects on spironolactone, palpitations  On aspirin Plavix for prior stroke 2018   Chronically elevated WBC  hospital with stroke 10/30/2016 Left upper extremity weakness on coming back from the beach while in the car She was on aspirin  and Plavix at the time Echocardiogram normal ejection fractionno significant  valve disease, moderate LVH, diastolic dysfunction   MRI brain: Small acute right basal ganglia region infarct. Prior strokes MRA: negative. Carotid duplex showed bilateral 0-49% stenosis.   Past Medical History:  Diagnosis Date   Acute hemorrhagic colitis 08/08/2019   Anemia    Anxiety    Arthritis    C. difficile colitis 08/09/2019   CAD (coronary artery disease)    a. 02/2006 PCI: BMS x 2 to RCA, cath o/w without significant coronary disease; b. nuclear stress test 07/2014: No ischemia/infarct; c. 11/2017 MV: no isch/infarct, EF 55-65%; d. 03/2020 NSTEMI/PCI: LM nl, LAD min irregs, RI 25, small, LCX nl, RCA 30p/m ISR, 99d (3.0x15 Resolute Onyx DES).   Chronic bronchitis (HCC)    secondary to cigarette smoking   Chronic kidney disease (CKD), stage III (moderate) (HCC)    COPD (chronic obstructive pulmonary disease) (HCC)    Diabetes mellitus    Diastolic dysfunction    a. 07/2019 Echo: EF 60-65%, no rwma, mod LVH. Nl RV size/fxn; b. 03/2020 Echo: EF 60-65%, mod LVH, gr1 DD, nl RV size/fxn, mild AS.   FHx: allergies    GERD (gastroesophageal reflux disease)    Goiter    Granulomatous disease (HCC)    Hernia    Hyperlipidemia    Hypertension    Kidney stone on left side 2013   Microalbuminuria    NSTEMI (non-ST elevated myocardial infarction) (HCC) 03/23/2020   Obesity    Panic attacks    PVC's (premature ventricular contractions)    a. 03/2018 Zio: Occas PVCs (2.5%). Triggered events assoc w/ PVC/PAC.   Retinal tear 2020   Smokers' cough (HCC)    Spinal stenosis    Stroke (HCC) 10/29/2016   mild left side weakness   Stroke (HCC) 08/01/2019   Tobacco abuse    Past Surgical History:  Procedure Laterality Date   ABDOMINAL HYSTERECTOMY     BREAST SURGERY     CATARACT EXTRACTION W/PHACO Right 03/01/2017   Procedure: CATARACT EXTRACTION PHACO AND INTRAOCULAR LENS PLACEMENT (IOC) RIGHT DIABETIC;  Surgeon: Lockie Mola, MD;  Location: [facility];  Service:  Ophthalmology;  Laterality: Right;   CATARACT EXTRACTION W/PHACO Left 03/22/2017   Procedure: CATARACT EXTRACTION PHACO AND INTRAOCULAR LENS PLACEMENT (IOC) LEFT DIABETIC;  Surgeon: Lockie Mola, MD;  Location: [facility];  Service: Ophthalmology;  Laterality: Left;  Diabetic - insulin and oral meds   COLONOSCOPY WITH PROPOFOL N/A 09/23/2014   Procedure: COLONOSCOPY WITH PROPOFOL;  Surgeon: Christena Deem, MD;  Location: [facility] ENDOSCOPY;  Service: Endoscopy;  Laterality: N/A;   COLONOSCOPY WITH PROPOFOL N/A 01/11/2018   Procedure: COLONOSCOPY WITH PROPOFOL;  Surgeon: Christena Deem, MD;  Location: [facility] ENDOSCOPY;  Service: Endoscopy;  Laterality: N/A;   COLONOSCOPY WITH PROPOFOL N/A 04/24/2018   Procedure: COLONOSCOPY WITH PROPOFOL;  Surgeon: Christena Deem, MD;  Location: [facility] END[facility]COPY;  Service: Endoscopy;  Laterality: N/A;   COLONOSCOPY WITH PROPOFOL N/A 11/19/2019   Procedure: COLONOSCOPY WITH PROPOFOL;  Surgeon: Midge Minium, MD;  Location: [facility] ENDOSCOPY;  Service: Endoscopy;  Laterality: N/A;   CORONARY ANGIOPLASTY WITH STENT PLACEMENT  2008   CORONARY STENT INTERVENTION N/A 03/24/2020   Procedure: CORONARY STENT INTERVENTION;  Surgeon: Yvonne Kendall, MD;  Location: [facility] INVASIVE CV LAB;  Service: Cardiovascular;  Laterality: N/A;   ESOPHAGOGASTRODUODENOSCOPY (EGD) WITH PROPOFOL N/A 12/30/2014   Procedure: ESOPHAGOGASTRODUODENOSCOPY (EGD) WITH PROPOFOL;  Surgeon: Christena Deem, MD;  Location: [facility] ENDOSCOPY;  Service: Endoscopy;  Laterality: N/A;   ESOPHAGOGASTRODUODENOSCOPY (EGD) WITH PROPOFOL N/A 07/19/2016   Procedure: ESOPHAGOGASTRODUODENOSCOPY (EGD) WITH PROPOFOL;  Surgeon: Christena Deem, MD;  Location: [facility] ENDOSCOPY;  Service: Endoscopy;  Laterality: N/A;   ESOPHAGOGASTRODUODENOSCOPY (EGD) WITH PROPOFOL N/A 04/24/2018   Procedure: ESOPHAGOGASTRODUODENOSCOPY (EGD) WITH PROPOFOL;  Surgeon: Christena Deem, MD;  Location: [facility] ENDOSCOPY;  Service:  Endoscopy;  Laterality: N/A;   hysterectomy (other)     LEFT HEART CATH AND CORONARY ANGIOGRAPHY N/A 03/24/2020   Procedure: LEFT HEART CATH AND CORONARY ANGIOGRAPHY;  Surgeon: Yvonne Kendall, MD;  Location: [facility] INVASIVE CV LAB;  Service: Cardiovascular;  Laterality: N/A;    Current Outpatient Medications on File Prior to Visit  Medication Sig Dispense Refill   acetaminophen (TYLENOL) 500 MG tablet Take 500 mg by mouth every 6 (six) hours as needed.     albuterol (VENTOLIN HFA) 108 (90 Base) MCG/ACT inhaler Inhale 2 puffs into the lungs every 4 (four) hours as needed for wheezing or shortness of breath. 48 g 3   aspirin (ASPIR-81) 81 MG EC tablet Take 81 mg by mouth daily.     clopidogrel (PLAVIX) 75 MG tablet Take 1 tablet (75 mg total) by mouth daily. 90 tablet 3   cyanocobalamin 1000 MCG tablet Take 1,000 mcg by mouth daily.     ezetimibe (ZETIA) 10 MG tablet Take 1 tablet (10 mg total) by mouth daily. 90 tablet 0   Fluticasone-Umeclidin-Vilant (TRELEGY ELLIPTA) 100-62.5-25 MCG/ACT AEPB Inhale 1 puff into the lungs daily. 90 each 3   HYDROcodone-acetaminophen (NORCO) 7.5-325 MG tablet Take 1 tablet by mouth every 6 (six) hours as needed for moderate pain. 120 tablet 0   insulin glargine (LANTUS) 100 UNIT/ML injection Inject 22 Units into the skin daily.      lisinopril (ZESTRIL) 20 MG tablet Take 1 tablet (20 mg total) by mouth daily. 90 tablet 3   metoprolol tartrate (LOPRESSOR) 100 MG tablet Take 1 tablet (100 mg total) by mouth 2 (two) times daily. 180 tablet 0   Multiple Vitamins-Minerals (DAILY MULTI) TABS Take 1 tablet by mouth daily.     nitroGLYCERIN (NITROSTAT) 0.4 MG SL tablet Place 1 tablet (0.4 mg total) under the tongue every 5 (five) minutes as needed. 25 tablet 6   Omega-3 Fatty Acids (FISH OIL) 1000 MG CAPS Take 2 capsules by mouth in the morning and at bedtime.      ondansetron (ZOFRAN ODT) 4 MG disintegrating tablet Take 1 tablet (4 mg total) by mouth every 8 (eight) hours  as needed for nausea or vomiting. 20 tablet 0   RABEprazole (ACIPHEX) 20 MG tablet Take 1 tablet (20 mg total) by mouth daily. 15 Mins. before evening meal 90 tablet 3   rosuvastatin (CRESTOR) 20 MG tablet Take 1 tablet (20 mg total) by mouth daily. 90 tablet 3   Semaglutide,0.25 or 0.5MG /DOS, 2 MG/1.5ML SOPN Inject 0.25 mg into the skin once a week.     sucralfate (CARAFATE) 1 g tablet Take 1 tablet (1 g total) by mouth 2 (two) times daily. 15 Minutes before evening meal and at bed time 180 tablet 3   No current facility-administered medications on file prior to visit.    Allergies:   Spironolactone, Sulfonamide derivatives, Other, Sulfa antibiotics, Codeine, and Prednisone   Social History   Tobacco Use   Smoking status: Every Day    Packs/day: 1.00    Years: 40.00    Additional pack years: 0.00    Total pack years: 40.00  Types: Cigarettes   Smokeless tobacco: Never  Vaping Use   Vaping Use: Former  Substance Use Topics   Alcohol use: Yes    Comment: 1 glass of wine every few months on special occassions   Drug use: No     Family Hx: The patient's family history includes Breast cancer in her maternal aunt; Congestive Heart Failure in her mother; Diabetes in her mother; Heart failure in her mother; Lung cancer in her father; Stroke in her mother.  ROS:   Please see the history of present illness.    Review of Systems  Constitutional: Negative.   HENT: Negative.    Respiratory:  Positive for cough and shortness of breath.   Cardiovascular: Negative.   Gastrointestinal: Negative.   Musculoskeletal: Negative.   Skin: Negative.   Neurological: Negative.   Psychiatric/Behavioral: Negative.    All other systems reviewed and are negative.    Labs/Other Tests and Data Reviewed:    Recent Labs: 04/08/2022: ALT 16; BUN 23; Creatinine, Ser 1.80; Potassium 4.4; Sodium 141   Recent Lipid Panel Lab Results  Component Value Date/Time   CHOL 160 04/08/2022 11:03 AM   TRIG  148 04/08/2022 11:03 AM   HDL 62 04/08/2022 11:03 AM   CHOLHDL 2.6 04/08/2022 11:03 AM   CHOLHDL 3.5 03/24/2020 03:33 AM   LDLCALC 73 04/08/2022 11:03 AM    Wt Readings from Last 3 Encounters:  05/18/22 163 lb 4.8 oz (74.1 kg)  03/09/22 159 lb (72.1 kg)  02/11/22 159 lb (72.1 kg)     Exam:    Vital Signs: Vital signs may also be detailed in the HPI There were no vitals taken for this visit.  Constitutional:  oriented to person, place, and time. No distress.  HENT:  Head: Grossly normal Eyes:  no discharge. No scleral icterus.  Neck: No JVD, no carotid bruits  Cardiovascular: Regular rate and rhythm, no murmurs appreciated Pulmonary/Chest: Clear to auscultation bilaterally, no wheezes or rails Abdominal: Soft.  no distension.  no tenderness.  Musculoskeletal: Normal range of motion Neurological:  normal muscle tone. Coordination normal. No atrophy Skin: Skin warm and dry Psychiatric: normal affect, pleasant  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 (HCC) stent to RCA, on aspirin Plavix,  Recommend she stay on Crestor Zetia, Currently with no symptoms of angina. No further workup at this time. Continue current medication regimen.  Dyspnea, unspecified type Deconditioned, long smoking history, COPD On oxygen as needed Has inhalers Chronic bronchitis Smoking cessation recommended  Essential hypertension Blood pressure is well controlled on today's visit. No changes made to the medications.  Renal insufficiency Followed by nephrology  Hyperlipidemia LDL goal <70 Crestor 20 and zetia 10  Chronic obstructive pulmonary disease, unspecified COPD type (HCC) Smoking cessation recommended  chronic bronchitis,.  Periodically needing oxygen  Diabetes:  on oxympic Weight is stable A1c trending down, 6.5  Vision loss Managed at [facility]  CVA: Several strokes in the past , chronic small vessel disease,  continues to smoke Crestor, Zetia, goal LDL less than 70   Total encounter time more than 30 minutes  Greater than 50% was spent in counseling and coordination of care with the patient   Signed, Julien Nordmann, MD  07/03/2022 5:39 PM    [facility] [address], [place], [place] [address]

## 2022-07-04 ENCOUNTER — Other Ambulatory Visit: Payer: Self-pay | Admitting: Family Medicine

## 2022-07-04 ENCOUNTER — Ambulatory Visit: Payer: Medicare Other | Admitting: Cardiovascular Disease

## 2022-07-04 ENCOUNTER — Encounter: Payer: Self-pay | Admitting: Family Medicine

## 2022-07-04 DIAGNOSIS — I5032 Chronic diastolic (congestive) heart failure: Secondary | ICD-10-CM

## 2022-07-04 DIAGNOSIS — I25118 Atherosclerotic heart disease of native coronary artery with other forms of angina pectoris: Secondary | ICD-10-CM

## 2022-07-04 DIAGNOSIS — Z720 Tobacco use: Secondary | ICD-10-CM

## 2022-07-04 DIAGNOSIS — E785 Hyperlipidemia, unspecified: Secondary | ICD-10-CM

## 2022-07-04 DIAGNOSIS — I714 Abdominal aortic aneurysm, without rupture, unspecified: Secondary | ICD-10-CM

## 2022-07-04 DIAGNOSIS — N183 Chronic kidney disease, stage 3 unspecified: Secondary | ICD-10-CM

## 2022-07-04 DIAGNOSIS — I10 Essential (primary) hypertension: Secondary | ICD-10-CM

## 2022-07-07 DIAGNOSIS — M48061 Spinal stenosis, lumbar region without neurogenic claudication: Secondary | ICD-10-CM | POA: Diagnosis not present

## 2022-07-07 DIAGNOSIS — M7138 Other bursal cyst, other site: Secondary | ICD-10-CM | POA: Diagnosis not present

## 2022-07-07 DIAGNOSIS — M5416 Radiculopathy, lumbar region: Secondary | ICD-10-CM | POA: Diagnosis not present

## 2022-07-07 DIAGNOSIS — M4807 Spinal stenosis, lumbosacral region: Secondary | ICD-10-CM | POA: Diagnosis not present

## 2022-07-07 DIAGNOSIS — M47817 Spondylosis without myelopathy or radiculopathy, lumbosacral region: Secondary | ICD-10-CM | POA: Diagnosis not present

## 2022-07-07 DIAGNOSIS — M4856XA Collapsed vertebra, not elsewhere classified, lumbar region, initial encounter for fracture: Secondary | ICD-10-CM | POA: Diagnosis not present

## 2022-07-15 DIAGNOSIS — M5416 Radiculopathy, lumbar region: Secondary | ICD-10-CM | POA: Diagnosis not present

## 2022-07-20 DIAGNOSIS — E1140 Type 2 diabetes mellitus with diabetic neuropathy, unspecified: Secondary | ICD-10-CM | POA: Diagnosis not present

## 2022-07-20 DIAGNOSIS — M5416 Radiculopathy, lumbar region: Secondary | ICD-10-CM | POA: Diagnosis not present

## 2022-07-29 ENCOUNTER — Telehealth: Payer: Self-pay | Admitting: Cardiovascular Disease

## 2022-07-29 NOTE — Telephone Encounter (Signed)
Pt has appt 08/22/22 with Dr. Mariah Milling. I will also see if [facility] office may be able to get the pt in sooner. I will update all parties involved.

## 2022-07-29 NOTE — Telephone Encounter (Signed)
   Pre-operative Risk Assessment    Patient Name: Hannah Kirby  DOB: [birth year] MRN: [id number]{      Request for Surgical Clearance    Procedure:   LUMBAR EPIDURAL STERIOD INJECTION rgery:  Clearance TBD                               Surgeon:  NOT INDICATED Surgeon's Group or Practice Name:  [facility] Phone number:  [phone] Fax number:  [phone]  Type of Clearance Requested:   7  DAYS PRIOR  Type of Anesthesia:  Not Indicated   Additional requests/questions:    Signed, Dalia Heading   07/29/2022, 11:33 AM

## 2022-07-29 NOTE — Telephone Encounter (Signed)
   Name: [name]  DOB: [birth year]  MRN: [id number]  Primary Cardiologist: Julien Nordmann, MD  Chart reviewed as part of pre-operative protocol coverage. Because of Hannah Kirby's past medical history and time since last visit, she will require a follow-up in-office visit in order to better assess preoperative cardiovascular risk.  Pre-op covering staff: - Please schedule appointment and call patient to inform them. If patient already had an upcoming appointment within acceptable timeframe, please add "pre-op clearance" to the appointment notes so provider is aware. - Please contact requesting surgeon's office via preferred method (i.e, phone, fax) to inform them of need for appointment prior to surgery.  Can hold Plavix for 5 to 7 days prior to procedure.  Would suggest continuing aspirin perioperatively.  Please resume Plavix and stop aspirin when medically safe to do so  [name], PA-C  07/29/2022, 12:45 PM

## 2022-08-04 ENCOUNTER — Other Ambulatory Visit: Payer: Self-pay | Admitting: Family Medicine

## 2022-08-05 DIAGNOSIS — R519 Headache, unspecified: Secondary | ICD-10-CM | POA: Diagnosis not present

## 2022-08-05 DIAGNOSIS — R200 Anesthesia of skin: Secondary | ICD-10-CM | POA: Diagnosis not present

## 2022-08-05 DIAGNOSIS — Z8673 Personal history of transient ischemic attack (TIA), and cerebral infarction without residual deficits: Secondary | ICD-10-CM | POA: Diagnosis not present

## 2022-08-05 DIAGNOSIS — M545 Low back pain, unspecified: Secondary | ICD-10-CM | POA: Diagnosis not present

## 2022-08-05 DIAGNOSIS — R270 Ataxia, unspecified: Secondary | ICD-10-CM | POA: Diagnosis not present

## 2022-08-05 DIAGNOSIS — R202 Paresthesia of skin: Secondary | ICD-10-CM | POA: Diagnosis not present

## 2022-08-05 MED ORDER — HYDROCODONE-ACETAMINOPHEN 7.5-325 MG PO TABS: 1.0000 | ORAL_TABLET | Freq: Four times a day (QID) | ORAL | 0 refills | Status: DC | PRN

## 2022-08-14 NOTE — Progress Notes (Unsigned)
Date:  08/16/2022   ID:  Hannah Kirby, DOB 07-02-1945, MRN [id number]  Patient Location:  [address] [place] [address]   Provider location:   [facility], [place] office  PCP:  Malva Limes, MD  Cardiologist:  [facility]   Chief Complaint  Patient presents with   12 month follow up      Cardiac clearance for a LUMBAR EPIDURAL STERIOD INJECTION by [facility]. Medications reviewed by the patient verbally. Patient c/o shortness of breath with over exertion.     History of Present Illness:    Hannah Kirby is a 77 y.o. female past medical history of diabetes,  obesity,  Anxiety, panic attacks significantly improved with  weight loss on  Victoza,  coronary artery disease with stenting of her RCA  in January 2008,  long smoking history who continues to smoke,  hyperlipidemia  outbreak of her granulomatous disease in summary 2013,  prednisone for this condition.  History pinched nerve,  on pain medication. Chronic constipation Chronic shortness of breath, COPD Stroke 2018, June 2021, discharged on aspirin Plavix,  previous strokes on MRI who presents for routine followup of her coronary artery disease and hypertension.  Last seen in clinic by myself March 2023 Seen by one of our providers for preop evaluation June 2023, EGD  In general reports that she feels relatively well Has noticed more sweating with movement, on left side of her body CVA previously affected the left side  Uses a walker Eating less, weight down Eats out less  Breathing is stable  Lab work reviewed CR 1.44 down from 1.8  Scheduled to see [facility] for cortisone shot beginning of June  No regular exercise program Chronic leg weakness No chest pain concerning for angina  Long history of smoking, chronic bronchitis  EKG personally reviewed by myself on todays visit EKG Interpretation  Date/Time:  Tuesday August 16 2022 09:44:40  EDT Ventricular Rate:  61 PR Interval:  174 QRS Duration: 92 QT Interval:  470 QTC Calculation: 473 R Axis:   2 Text Interpretation: Normal sinus rhythm Possible Left atrial enlargement Left ventricular hypertrophy with repolarization abnormality ( R in aVL , Cornell product ) Inferior infarct (cited on or before 23-Mar-2020) When compared with ECG of 25-Mar-2020 09:04, T wave inversion more evident in Lateral leads Confirmed by Julien Nordmann [phone]) on 08/16/2022 9:47:34 AM    EKG personally reviewed by myself on todays visit Normal sinus rhythm rate 76 bpm no significant ST-T wave changes  Other past medical history reviewed  cardiac catheterization 2020 , stent to the RCA On aspirin Plavix  Lab work reviewed Cholesterol above goal A1C 6.7, down from 8  Cath 03/2020 Severe single-vessel coronary artery disease with 99% distal RCA stenosis. Mild, nonobstructive left coronary artery disease with minimal luminal irregularities involving the LAD and 20-30% ostial ramus stenosis. Patent proximal/mid RCA stents with mild to moderate in-stent restenosis (~30%). Mildly elevated left ventricular filling pressure (LVEDP 15-20 mmHg). Successful PCI to distal RCA using Resolute Onyx 3.0 x 15 mm drug-eluting stent with 0% residual stenosis and TIMI-3 flow. Right radial artery loop; consider alternative access for future catheterizations.   Recommendations: Remove right femoral artery sheath 2 hours after completion of procedure. Dual antiplatelet therapy with aspirin and clopidogrel for at least 12 months. Aggressive secondary prevention. Obtain echocardiogram. Gentle post catheterization hydration in the setting of chronic kidney disease and mildly elevated left ventricular filling  pressure.  Past medical history reviewed  hospital July 2021  with bloody diarrhea, leukocytosis C.diff: Positive Treated with vancomycin  In 2020, torn retina, symptoms started with floaters and vision  deficits  had laser treatment, and follow-up with [facility]  CT head: No CT evidence of acute intracranial abnormality. Moderate chronic small vessel ischemic disease with redemonstrated chronic lacunar infarcts in the bilateral basal ganglia, right thalamus and within the pons. Stable, mild generalized parenchymal atrophy.  MRI 1. 8 mm acute lacunar infarct within the right thalamocapsular junction. 2. An additional small acute white matter infarct is questioned within the left corona radiata, as described.  Stress test 11/2017 The study is normal. This is a low risk study. The left ventricular ejection fraction is normal (55-65%).  Previously had side effects on spironolactone, palpitations  On aspirin Plavix for prior stroke 2018   Chronically elevated WBC  hospital with stroke 10/30/2016 Left upper extremity weakness on coming back from the beach while in the car She was on aspirin and Plavix at the time Echocardiogram normal ejection fractionno significant valve disease, moderate LVH, diastolic dysfunction   MRI brain: Small acute right basal ganglia region infarct. Prior strokes MRA: negative. Carotid duplex showed bilateral 0-49% stenosis.   Past Medical History:  Diagnosis Date   Acute hemorrhagic colitis 08/08/2019   Anemia    Anxiety    Arthritis    C. difficile colitis 08/09/2019   CAD (coronary artery disease)    a. 02/2006 PCI: BMS x 2 to RCA, cath o/w without significant coronary disease; b. nuclear stress test 07/2014: No ischemia/infarct; c. 11/2017 MV: no isch/infarct, EF 55-65%; d. 03/2020 NSTEMI/PCI: LM nl, LAD min irregs, RI 25, small, LCX nl, RCA 30p/m ISR, 99d (3.0x15 Resolute Onyx DES).   Chronic bronchitis (HCC)    secondary to cigarette smoking   Chronic kidney disease (CKD), stage III (moderate) (HCC)    COPD (chronic obstructive pulmonary disease) (HCC)    Diabetes mellitus    Diastolic dysfunction    a. 07/2019 Echo: EF 60-65%, no rwma, mod LVH. Nl  RV size/fxn; b. 03/2020 Echo: EF 60-65%, mod LVH, gr1 DD, nl RV size/fxn, mild AS.   FHx: allergies    GERD (gastroesophageal reflux disease)    Goiter    Granulomatous disease (HCC)    Hernia    Hyperlipidemia    Hypertension    Kidney stone on left side 2013   Microalbuminuria    NSTEMI (non-ST elevated myocardial infarction) (HCC) 03/23/2020   Obesity    Panic attacks    PVC's (premature ventricular contractions)    a. 03/2018 Zio: Occas PVCs (2.5%). Triggered events assoc w/ PVC/PAC.   Retinal tear 2020   Smokers' cough (HCC)    Spinal stenosis    Stroke (HCC) 10/29/2016   mild left side weakness   Stroke (HCC) 08/01/2019   Tobacco abuse    Past Surgical History:  Procedure Laterality Date   ABDOMINAL HYSTERECTOMY     BREAST SURGERY     CATARACT EXTRACTION W/PHACO Right 03/01/2017   Procedure: CATARACT EXTRACTION PHACO AND INTRAOCULAR LENS PLACEMENT (IOC) RIGHT DIABETIC;  Surgeon: Lockie Mola, MD;  Location: [facility];  Service: Ophthalmology;  Laterality: Right;   CATARACT EXTRACTION W/PHACO Left 03/22/2017   Procedure: CATARACT EXTRACTION PHACO AND INTRAOCULAR LENS PLACEMENT (IOC) LEFT DIABETIC;  Surgeon: Lockie Mola, MD;  Location: [facility];  Service: Ophthalmology;  Laterality: Left;  Diabetic - insulin and oral meds   COLONOSCOPY WITH PROPOFOL N/A 09/23/2014  Procedure: COLONOSCOPY WITH PROPOFOL;  Surgeon: Christena Deem, MD;  Location: [facility] ENDOSCOPY;  Service: Endoscopy;  Laterality: N/A;   COLONOSCOPY WITH PROPOFOL N/A 01/11/2018   Procedure: COLONOSCOPY WITH PROPOFOL;  Surgeon: Christena Deem, MD;  Location: [facility] ENDOSCOPY;  Service: Endoscopy;  Laterality: N/A;   COLONOSCOPY WITH PROPOFOL N/A 04/24/2018   Procedure: COLONOSCOPY WITH PROPOFOL;  Surgeon: Christena Deem, MD;  Location: [facility] ENDOSCOPY;  Service: Endoscopy;  Laterality: N/A;   COLONOSCOPY WITH PROPOFOL N/A 11/19/2019   Procedure: COLONOSCOPY WITH PROPOFOL;   Surgeon: Midge Minium, MD;  Location: [facility] ENDOSCOPY;  Service: Endoscopy;  Laterality: N/A;   CORONARY ANGIOPLASTY WITH STENT PLACEMENT  2008   CORONARY STENT INTERVENTION N/A 03/24/2020   Procedure: CORONARY STENT INTERVENTION;  Surgeon: Yvonne Kendall, MD;  Location: [facility] INVASIVE CV LAB;  Service: Cardiovascular;  Laterality: N/A;   ESOPHAGOGASTRODUODENOSCOPY (EGD) WITH PROPOFOL N/A 12/30/2014   Procedure: ESOPHAGOGASTRODUODENOSCOPY (EGD) WITH PROPOFOL;  Surgeon: Christena Deem, MD;  Location: [facility] ENDOSCOPY;  Service: Endoscopy;  Laterality: N/A;   ESOPHAGOGASTRODUODENOSCOPY (EGD) WITH PROPOFOL N/A 07/19/2016   Procedure: ESOPHAGOGASTRODUODENOSCOPY (EGD) WITH PROPOFOL;  Surgeon: Christena Deem, MD;  Location: [facility] ENDOSCOPY;  Service: Endoscopy;  Laterality: N/A;   ESOPHAGOGASTRODUODENOSCOPY (EGD) WITH PROPOFOL N/A 04/24/2018   Procedure: ESOPHAGOGASTRODUODENOSCOPY (EGD) WITH PROPOFOL;  Surgeon: Christena Deem, MD;  Location: [facility] ENDOSCOPY;  Service: Endoscopy;  Laterality: N/A;   hysterectomy (other)     LEFT HEART CATH AND CORONARY ANGIOGRAPHY N/A 03/24/2020   Procedure: LEFT HEART CATH AND CORONARY ANGIOGRAPHY;  Surgeon: Yvonne Kendall, MD;  Location: [facility] INVASIVE CV LAB;  Service: Cardiovascular;  Laterality: N/A;    Current Outpatient Medications on File Prior to Visit  Medication Sig Dispense Refill   acetaminophen (TYLENOL) 500 MG tablet Take 500 mg by mouth every 6 (six) hours as needed.     albuterol (VENTOLIN HFA) 108 (90 Base) MCG/ACT inhaler Inhale 2 puffs into the lungs every 4 (four) hours as needed for wheezing or shortness of breath. 48 g 3   aspirin (ASPIR-81) 81 MG EC tablet Take 81 mg by mouth daily.     clopidogrel (PLAVIX) 75 MG tablet Take 1 tablet (75 mg total) by mouth daily. 90 tablet 3   cyanocobalamin 1000 MCG tablet Take 1,000 mcg by mouth daily.     dapagliflozin propanediol (FARXIGA) 10 MG TABS tablet Take 10 mg by mouth daily.     Docusate Sodium  (DSS) 100 MG CAPS Take by mouth.     ezetimibe (ZETIA) 10 MG tablet Take 1 tablet (10 mg total) by mouth daily. 90 tablet 0   Fluticasone-Umeclidin-Vilant (TRELEGY ELLIPTA) 100-62.5-25 MCG/ACT AEPB Inhale 1 puff into the lungs daily. 90 each 3   gabapentin (NEURONTIN) 100 MG capsule Take 1 capsule by mouth at bedtime.     HYDROcodone-acetaminophen (NORCO) 7.5-325 MG tablet Take 1 tablet by mouth every 6 (six) hours as needed for moderate pain. 120 tablet 0   insulin glargine (LANTUS) 100 UNIT/ML injection Inject 22 Units into the skin daily.      lisinopril (ZESTRIL) 20 MG tablet Take 1 tablet (20 mg total) by mouth daily. 90 tablet 3   meclizine (ANTIVERT) 25 MG tablet Take 25 mg by mouth 3 (three) times daily as needed.     metoprolol tartrate (LOPRESSOR) 100 MG tablet Take 1 tablet (100 mg total) by mouth 2 (two) times daily. 180 tablet 0   Multiple Vitamins-Minerals (DAILY MULTI) TABS Take 1 tablet by mouth daily.  nitroGLYCERIN (NITROSTAT) 0.4 MG SL tablet Place 1 tablet (0.4 mg total) under the tongue every 5 (five) minutes as needed. 25 tablet 6   Omega-3 Fatty Acids (FISH OIL) 1000 MG CAPS Take 2 capsules by mouth in the morning and at bedtime.      ondansetron (ZOFRAN ODT) 4 MG disintegrating tablet Take 1 tablet (4 mg total) by mouth every 8 (eight) hours as needed for nausea or vomiting. 20 tablet 0   pregabalin (LYRICA) 25 MG capsule Take 25 mg by mouth daily.     RABEprazole (ACIPHEX) 20 MG tablet Take 1 tablet (20 mg total) by mouth daily. 15 Mins. before evening meal 90 tablet 3   rosuvastatin (CRESTOR) 20 MG tablet Take 1 tablet (20 mg total) by mouth daily. 90 tablet 3   Semaglutide,0.25 or 0.5MG /DOS, 2 MG/1.5ML SOPN Inject 0.25 mg into the skin once a week.     sucralfate (CARAFATE) 1 g tablet Take 1 tablet (1 g total) by mouth 2 (two) times daily. 15 Minutes before evening meal and at bed time 180 tablet 3   tiZANidine (ZANAFLEX) 4 MG tablet Take 4 mg by mouth at bedtime.      calcium carbonate (TUMS - DOSED IN MG ELEMENTAL CALCIUM) 500 MG chewable tablet Chew 1 tablet by mouth daily.     No current facility-administered medications on file prior to visit.    Allergies:   Spironolactone, Sulfa antibiotics, Sulfonamide derivatives, Other, Codeine, and Prednisone   Social History   Tobacco Use   Smoking status: Every Day    Packs/day: 1.00    Years: 40.00    Additional pack years: 0.00    Total pack years: 40.00    Types: Cigarettes   Smokeless tobacco: Never  Vaping Use   Vaping Use: Former  Substance Use Topics   Alcohol use: Yes    Comment: 1 glass of wine every few months on special occassions   Drug use: No     Family Hx: The patient's family history includes Breast cancer in her maternal aunt; Congestive Heart Failure in her mother; Diabetes in her mother; Heart failure in her mother; Lung cancer in her father; Stroke in her mother.  ROS:   Please see the history of present illness.    Review of Systems  Constitutional: Negative.   HENT: Negative.    Respiratory:  Positive for cough and shortness of breath.   Cardiovascular: Negative.   Gastrointestinal: Negative.   Musculoskeletal: Negative.   Skin: Negative.   Neurological: Negative.   Psychiatric/Behavioral: Negative.    All other systems reviewed and are negative.    Labs/Other Tests and Data Reviewed:    Recent Labs: 04/08/2022: ALT 16; BUN 23; Creatinine, Ser 1.80; Potassium 4.4; Sodium 141   Recent Lipid Panel Lab Results  Component Value Date/Time   CHOL 160 04/08/2022 11:03 AM   TRIG 148 04/08/2022 11:03 AM   HDL 62 04/08/2022 11:03 AM   CHOLHDL 2.6 04/08/2022 11:03 AM   CHOLHDL 3.5 03/24/2020 03:33 AM   LDLCALC 73 04/08/2022 11:03 AM    Wt Readings from Last 3 Encounters:  08/16/22 151 lb 8 oz (68.7 kg)  05/18/22 163 lb 4.8 oz (74.1 kg)  03/09/22 159 lb (72.1 kg)     Exam:    Vital Signs: Vital signs may also be detailed in the HPI BP 124/74 (BP Location:  Left Arm, Patient Position: Sitting, Cuff Size: Normal)   Pulse 64   Ht 5\' 5"  (1.651 m)  Wt 151 lb 8 oz (68.7 kg)   SpO2 94%   BMI 25.21 kg/m   Constitutional:  oriented to person, place, and time. No distress.  HENT:  Head: Grossly normal Eyes:  no discharge. No scleral icterus.  Neck: No JVD, no carotid bruits  Cardiovascular: Regular rate and rhythm, no murmurs appreciated Pulmonary/Chest: Clear to auscultation bilaterally, no wheezes or rails Abdominal: Soft.  no distension.  no tenderness.  Musculoskeletal: Normal range of motion Neurological:  normal muscle tone. Coordination normal. No atrophy Skin: Skin warm and dry Psychiatric: normal affect, pleasant  ASSESSMENT & PLAN:    Preop cardiovascular evaluation Scheduled for cortisone shot next week with orthopedics Acceptable risk to hold Plavix 7 days prior to injection Restart Plavix when cleared by performing provider  Atherosclerosis of native coronary artery of native heart with stable angina pectoris (HCC) stent to RCA, on aspirin Plavix,  Recommend she stay on Crestor Zetia, Denies anginal symptoms, no further ischemic workup  Dyspnea, unspecified type Deconditioned, long smoking history, COPD On oxygen as needed Has inhalers Chronic bronchitis Smoking cessation recommended Walking recommended for conditioning  Essential hypertension Blood pressure is well controlled on today's visit. No changes made to the medications.  Renal insufficiency Followed by nephrology, creatinine 1.4  Hyperlipidemia LDL goal <70 Crestor 20 and zetia 10 LDL slightly above goal, will continue to monitor Recommend we work aggressively with her A1c  Chronic obstructive pulmonary disease, unspecified COPD type (HCC) Smoking cessation recommended  chronic bronchitis,.  Periodically needing oxygen  Diabetes:  on oxympic Weight trending down A1c 6.9, trending upward  Vision loss [facility]  CVA: Several strokes in  the past , chronic small vessel disease, continues to smoke Crestor, Zetia, goal LDL less than 70 Smoking cessation recommended   Total encounter time more than 40 minutes  Greater than 50% was spent in counseling and coordination of care with the patient   Signed, Julien Nordmann, MD  08/16/2022 9:30 AM    [facility] [address], [place], [place] [address]

## 2022-08-16 ENCOUNTER — Encounter: Payer: Self-pay | Admitting: Cardiovascular Disease

## 2022-08-16 ENCOUNTER — Ambulatory Visit: Payer: Medicare Other | Attending: Cardiovascular Disease | Admitting: Cardiovascular Disease

## 2022-08-16 VITALS — BP 124/74 | HR 64 | Ht 65.0 in | Wt 151.5 lb

## 2022-08-16 DIAGNOSIS — I714 Abdominal aortic aneurysm, without rupture, unspecified: Secondary | ICD-10-CM | POA: Diagnosis not present

## 2022-08-16 DIAGNOSIS — I5032 Chronic diastolic (congestive) heart failure: Secondary | ICD-10-CM | POA: Diagnosis not present

## 2022-08-16 DIAGNOSIS — I25118 Atherosclerotic heart disease of native coronary artery with other forms of angina pectoris: Secondary | ICD-10-CM | POA: Diagnosis not present

## 2022-08-16 DIAGNOSIS — E785 Hyperlipidemia, unspecified: Secondary | ICD-10-CM | POA: Insufficient documentation

## 2022-08-16 DIAGNOSIS — N183 Chronic kidney disease, stage 3 unspecified: Secondary | ICD-10-CM | POA: Insufficient documentation

## 2022-08-16 DIAGNOSIS — Z720 Tobacco use: Secondary | ICD-10-CM | POA: Insufficient documentation

## 2022-08-16 DIAGNOSIS — I10 Essential (primary) hypertension: Secondary | ICD-10-CM | POA: Insufficient documentation

## 2022-08-16 MED ORDER — LISINOPRIL 20 MG PO TABS: 20.0000 mg | ORAL_TABLET | Freq: Every day | ORAL | 3 refills | Status: DC

## 2022-08-16 MED ORDER — ROSUVASTATIN CALCIUM 20 MG PO TABS: 20.0000 mg | ORAL_TABLET | Freq: Every day | ORAL | 3 refills | Status: DC

## 2022-08-16 MED ORDER — CLOPIDOGREL BISULFATE 75 MG PO TABS: 75.0000 mg | ORAL_TABLET | Freq: Every day | ORAL | 3 refills | Status: DC

## 2022-08-16 MED ORDER — EZETIMIBE 10 MG PO TABS: 10.0000 mg | ORAL_TABLET | Freq: Every day | ORAL | 3 refills | Status: DC

## 2022-08-16 MED ORDER — NITROGLYCERIN 0.4 MG SL SUBL: 0.4000 mg | SUBLINGUAL_TABLET | SUBLINGUAL | 3 refills | Status: DC | PRN

## 2022-08-16 MED ORDER — METOPROLOL TARTRATE 100 MG PO TABS: 100.0000 mg | ORAL_TABLET | Freq: Two times a day (BID) | ORAL | 3 refills | Status: DC

## 2022-08-16 NOTE — Patient Instructions (Addendum)
Medication Instructions:  Stop plavix 7 days before shot to back, June 27th Thursday Restart once cleared by doctor performing procedure  If you need a refill on your cardiac medications before your next appointment, please call your pharmacy.   Lab work: No new labs needed  Testing/Procedures: No new testing needed  Follow-Up: At [facility], you and your health needs are our priority.  As part of our continuing mission to provide you with exceptional heart care, we have created designated Provider Care Teams.  These Care Teams include your primary Cardiologist (physician) and Advanced Practice Providers (APPs -  Physician Assistants and Nurse Practitioners) who all work together to provide you with the care you need, when you need it.  You will need a follow up appointment in 12 months  Providers on your designated Care Team:   Nicolasa Ducking, NP Eula Listen, PA-C Cadence Fransico Michael, [place]  COVID-19 Vaccine Information can be found at: [email or web address] For questions related to vaccine distribution or appointments, please email [email or web address] or call [phone].

## 2022-08-18 NOTE — Addendum Note (Signed)
Addended by: Sherri Rad C on: 08/18/2022 09:02 AM   Modules accepted: Orders

## 2022-08-22 ENCOUNTER — Ambulatory Visit: Payer: Medicare Other | Admitting: Cardiovascular Disease

## 2022-08-24 DIAGNOSIS — N1832 Chronic kidney disease, stage 3b: Secondary | ICD-10-CM | POA: Diagnosis not present

## 2022-08-24 DIAGNOSIS — N2581 Secondary hyperparathyroidism of renal origin: Secondary | ICD-10-CM | POA: Diagnosis not present

## 2022-08-24 DIAGNOSIS — R809 Proteinuria, unspecified: Secondary | ICD-10-CM | POA: Diagnosis not present

## 2022-08-24 DIAGNOSIS — E1122 Type 2 diabetes mellitus with diabetic chronic kidney disease: Secondary | ICD-10-CM | POA: Diagnosis not present

## 2022-08-24 DIAGNOSIS — I129 Hypertensive chronic kidney disease with stage 1 through stage 4 chronic kidney disease, or unspecified chronic kidney disease: Secondary | ICD-10-CM | POA: Diagnosis not present

## 2022-08-25 LAB — URINALYSIS, W/ REFLEX TO CULTURE (INFECTION SUSPECTED)
Bilirubin (Urine): NEGATIVE
Hemoglobin, Urine: NEGATIVE
Ketones, urine: NEGATIVE
Specific Gravity: 1.021
WBC, UA: NEGATIVE
pH, Urine: 6

## 2022-08-25 LAB — CBC WITH DIFFERENTIAL
BASO(ABSOLUTE): 37
Basophils Relative: 1 (ref 0–3)
Eosinophil %: 3.3
Eosinophils Absolute: 244
HCT: 50 — AB (ref 29–41)
Hemoglobin: 16.4 — AB
Lymphocyte %: 33.6
Lymphocytes Absolute: 2486 /uL
MCH: 31.5
MCHC: 33.1
MCV: 95.2 (ref 76–111)
MPV: 12.1 fL — AB (ref 7.8–11.0)
Monocyte %: 6.1
Monocytes(Absolute): 451
Neutro Abs: 4181
Neutrophil %: 56.5
Platelets: 202
RBC: 5.21 — AB (ref 3.87–5.11)
RDW: 13
WBC: 7.4

## 2022-08-25 LAB — RENAL FUNCTION PANEL
Albumin: 3.9
BUN/Creatinine Ratio: 11
BUN: 18
Bicarbonate: 26 (ref 20–28)
Calcium: 10.4
Chloride: 105
Creatinine: 1.63 — ABNORMAL HIGH
Glucose: 211 — ABNORMAL HIGH
Phosphorus: 3.7
Potassium: 3.5
Sodium: 140
eGFR: 32 — ABNORMAL LOW

## 2022-08-25 LAB — PTH, INTACT: PTH, Intact: 84 — ABNORMAL HIGH

## 2022-08-25 LAB — PROTEIN / CREATININE RATIO, URINE
Creatinine: 102
Protein, Urine: 71 — AB
Protein/Creatinine, urine: 696 — AB

## 2022-08-30 ENCOUNTER — Telehealth: Payer: Self-pay

## 2022-08-30 NOTE — Telephone Encounter (Signed)
Copied from CRM [id number]. Topic: General - Other >> Aug 29, 2022  4:02 PM Lennox Pippins wrote: Patient called and requested Alex (Clinical Pharmacist) to call her back in regards to medications from Dr. Servando Snare.   Patients callback #: [phone]

## 2022-08-31 ENCOUNTER — Telehealth: Payer: Self-pay

## 2022-08-31 DIAGNOSIS — M5416 Radiculopathy, lumbar region: Secondary | ICD-10-CM | POA: Diagnosis not present

## 2022-08-31 NOTE — Progress Notes (Signed)
   Care Guide Note  08/31/2022 Name: BRYNNE DOANE MRN: [id number] DOB: 21-Jun-1945  Referred by: Malva Limes, MD Reason for referral : Care Coordination (Outreach to schedule with Pharm d Previously with [place] )   Regene Mccarthy Geeslin is a 77 y.o. year old female who is a primary care patient of Sherrie Mustache, Demetrios Isaacs, MD. Brenton Grills Sudduth was referred to the pharmacist for assistance related to DM.    Successful contact was made with the patient to discuss pharmacy services including being ready for the pharmacist to call at least 5 minutes before the scheduled appointment time, to have medication bottles and any blood sugar or blood pressure readings ready for review. The patient agreed to meet with the pharmacist via with the pharmacist via telephone visit on (date/time).  10/26/2022  Penne Lash, RMA Care Guide [facility]  [place], [place] [address] Direct Dial: [phone] [email or web address]

## 2022-08-31 NOTE — Progress Notes (Signed)
   Care Guide Note  08/31/2022 Name: Hannah Kirby MRN: [id number] DOB: 1945/09/10  Referred by: Malva Limes, MD Reason for referral : Care Coordination (Outreach to schedule with Pharm d Previously with [place] )   [name] is a 77 y.o. year old female who is a primary care patient of Sherrie Mustache, Demetrios Isaacs, MD. Brenton Grills Winkowski was referred to the pharmacist for assistance related to DM.    An unsuccessful telephone outreach was attempted today to contact the patient who was referred to the pharmacy team for assistance with medication management. Additional attempts will be made to contact the patient.   Penne Lash, RMA Care Guide [facility]  [place], [place] [address] Direct Dial: [phone] [email or web address]

## 2022-09-05 ENCOUNTER — Other Ambulatory Visit: Payer: Self-pay

## 2022-09-05 NOTE — Telephone Encounter (Signed)
Patient states she called and left a message early spring to make a follow up appointment with Dr. Servando Snare and did not get a call back and then called a month ago to get a refill on 2 medications and left a message and did not get a call back. She states she is totally out of the Carafate and needs a refill on Rabeprazole. Patient made a appointemnt with Dr. Servando Snare for 11/08/2022 and would like refills sent to [facility]

## 2022-09-06 MED ORDER — RABEPRAZOLE SODIUM 20 MG PO TBEC: 20.0000 mg | DELAYED_RELEASE_TABLET | Freq: Every day | ORAL | 0 refills | Status: DC

## 2022-09-06 MED ORDER — SUCRALFATE 1 G PO TABS: 1.0000 g | ORAL_TABLET | Freq: Two times a day (BID) | ORAL | 0 refills | Status: DC

## 2022-09-09 ENCOUNTER — Encounter: Payer: Self-pay | Admitting: Family Medicine

## 2022-09-09 ENCOUNTER — Ambulatory Visit (INDEPENDENT_AMBULATORY_CARE_PROVIDER_SITE_OTHER): Payer: Medicare Other | Admitting: Family Medicine

## 2022-09-09 VITALS — BP 172/104 | HR 62 | Ht 65.0 in | Wt 148.1 lb

## 2022-09-09 DIAGNOSIS — N1832 Chronic kidney disease, stage 3b: Secondary | ICD-10-CM | POA: Diagnosis not present

## 2022-09-09 DIAGNOSIS — M48061 Spinal stenosis, lumbar region without neurogenic claudication: Secondary | ICD-10-CM | POA: Diagnosis not present

## 2022-09-09 DIAGNOSIS — F17210 Nicotine dependence, cigarettes, uncomplicated: Secondary | ICD-10-CM | POA: Diagnosis not present

## 2022-09-09 DIAGNOSIS — J449 Chronic obstructive pulmonary disease, unspecified: Secondary | ICD-10-CM | POA: Diagnosis not present

## 2022-09-09 DIAGNOSIS — E1122 Type 2 diabetes mellitus with diabetic chronic kidney disease: Secondary | ICD-10-CM

## 2022-09-09 DIAGNOSIS — I2510 Atherosclerotic heart disease of native coronary artery without angina pectoris: Secondary | ICD-10-CM | POA: Diagnosis not present

## 2022-09-09 DIAGNOSIS — I10 Essential (primary) hypertension: Secondary | ICD-10-CM

## 2022-09-09 MED ORDER — HYDROCODONE-ACETAMINOPHEN 7.5-325 MG PO TABS: 1.0000 | ORAL_TABLET | Freq: Four times a day (QID) | ORAL | 0 refills | Status: DC | PRN

## 2022-09-09 NOTE — Progress Notes (Unsigned)
Established patient visit   Patient: Hannah Kirby   DOB: 01-11-1946   77 y.o. Female  MRN: [id number] Visit Date: 09/09/2022  Today's healthcare provider: Mila Merry, MD   Chief Complaint  Patient presents with   Follow-up    Follow up  back pain , had injection last week helped some w/ pain    Subjective    Discussed the use of AI scribe software for clinical note transcription with the patient, who gave verbal consent to proceed.  History of Present Illness   The patient, with a history of chronic back pain, reports a recent lumbar injection which was described as "painful" and resulted in radiating pain down the legs. The pain has since improved, but remains bothersome, necessitating continued use of hydrocodone, albeit less frequently. The patient requests a refill of this medication.  The patient also reports a history of gastroesophageal reflux disease (GERD), managed with sucralfate and rabeprazole. She has recently been off these medications and has experienced a resurgence of indigestion symptoms. The patient requests a refill of these medications as well.  The patient has a history of chronic obstructive pulmonary disease (COPD) and is managed with Trelegy and occasional albuterol. She reports that the Trelegy seems to be controlling her symptoms well. The patient continues to smoke about a pack of cigarettes a day.  The patient also has a history of diabetes, which is usually well-controlled with blood sugars around 120. However, she reports a recent increase in blood sugars to around 150 following her lumbar injection. She continues to follow up with Dr. Gershon Crane.      Medications: Outpatient Medications Prior to Visit  Medication Sig   acetaminophen (TYLENOL) 500 MG tablet Take 500 mg by mouth every 6 (six) hours as needed.   albuterol (VENTOLIN HFA) 108 (90 Base) MCG/ACT inhaler Inhale 2 puffs into the lungs every 4 (four) hours as needed for wheezing or  shortness of breath.   aspirin (ASPIR-81) 81 MG EC tablet Take 81 mg by mouth daily.   calcium carbonate (TUMS - DOSED IN MG ELEMENTAL CALCIUM) 500 MG chewable tablet Chew 1 tablet by mouth daily.   clopidogrel (PLAVIX) 75 MG tablet Take 1 tablet (75 mg total) by mouth daily.   cyanocobalamin 1000 MCG tablet Take 1,000 mcg by mouth daily.   dapagliflozin propanediol (FARXIGA) 10 MG TABS tablet Take 10 mg by mouth daily.   Docusate Sodium (DSS) 100 MG CAPS Take by mouth.   ezetimibe (ZETIA) 10 MG tablet Take 1 tablet (10 mg total) by mouth daily.   Fluticasone-Umeclidin-Vilant (TRELEGY ELLIPTA) 100-62.5-25 MCG/ACT AEPB Inhale 1 puff into the lungs daily.   gabapentin (NEURONTIN) 100 MG capsule Take 1 capsule by mouth at bedtime.   insulin glargine (LANTUS) 100 UNIT/ML injection Inject 18 Units into the skin daily.   lisinopril (ZESTRIL) 20 MG tablet Take 1 tablet (20 mg total) by mouth daily.   meclizine (ANTIVERT) 25 MG tablet Take 25 mg by mouth 3 (three) times daily as needed.   metoprolol tartrate (LOPRESSOR) 100 MG tablet Take 1 tablet (100 mg total) by mouth 2 (two) times daily.   Multiple Vitamins-Minerals (DAILY MULTI) TABS Take 1 tablet by mouth daily.   nitroGLYCERIN (NITROSTAT) 0.4 MG SL tablet Place 1 tablet (0.4 mg total) under the tongue every 5 (five) minutes as needed.   Omega-3 Fatty Acids (FISH OIL) 1000 MG CAPS Take 2 capsules by mouth in the morning and at bedtime.  ondansetron (ZOFRAN ODT) 4 MG disintegrating tablet Take 1 tablet (4 mg total) by mouth every 8 (eight) hours as needed for nausea or vomiting.   pregabalin (LYRICA) 25 MG capsule Take 25 mg by mouth daily.   RABEprazole (ACIPHEX) 20 MG tablet Take 1 tablet (20 mg total) by mouth daily. 15 Mins. before evening meal   rosuvastatin (CRESTOR) 20 MG tablet Take 1 tablet (20 mg total) by mouth daily.   Semaglutide,0.25 or 0.5MG /DOS, 2 MG/1.5ML SOPN Inject 0.25 mg into the skin once a week.   sucralfate (CARAFATE) 1 g  tablet Take 1 tablet (1 g total) by mouth 2 (two) times daily. 15 Minutes before evening meal and at bed time   tiZANidine (ZANAFLEX) 4 MG tablet Take 4 mg by mouth at bedtime.   [DISCONTINUED] HYDROcodone-acetaminophen (NORCO) 7.5-325 MG tablet Take 1 tablet by mouth every 6 (six) hours as needed for moderate pain.   No facility-administered medications prior to visit.      Objective    BP (!) 172/104   Pulse 62   Ht 5\' 5"  (1.651 m)   Wt 148 lb 1.6 oz (67.2 kg)   SpO2 100%   BMI 24.65 kg/m   Physical Exam   General: Appearance:    Well developed, well nourished female in no acute distress  Eyes:    PERRL, conjunctiva/corneas clear, EOM's intact       Lungs:     Clear to auscultation bilaterally, respirations unlabored  Heart:    Normal heart rate. Normal rhythm. No murmurs, rubs, or gallops.    MS:   All extremities are intact.    Neurologic:   Awake, alert, oriented x 3. No apparent focal neurological defect.        Assessment & Plan     Assessment and Plan    Chronic Back Pain: Recent epidural injection with transient increased pain. Current pain is improved but still present. Hydrocodone use as needed. -Refill Hydrocodone prescription and send to [facility].  Gastroesophageal Reflux Disease (GERD): Patient reports increased indigestion since stopping Sucralfate and Rabeprazole. Prescription refill sent to [facility] -Advise patient to contact [facility] to ensure processing of prescription refill.  Chronic Obstructive Pulmonary Disease (COPD): Stable on Trelegy, occasional use of Albuterol. No acute respiratory symptoms. -Continue current inhaler regimen.  Diabetes Mellitus: Recent increase in blood glucose levels following epidural injection. Prior control was good with levels around 120. -Continue current management and monitor blood glucose levels.  Tobacco Use: Current smoker, approximately 1 pack per day. -Recommend low-dose CT scan for lung cancer screening.  Referral to pulmonology clinic for scheduling.  Follow-up: Schedule follow-up appointment.         Mila Merry, MD  [facility] [phone] (phone) [phone] (fax)  [facility]

## 2022-09-09 NOTE — Patient Instructions (Signed)
.   Please review the attached list of medications and notify my office if there are any errors.   . Please bring all of your medications to every appointment so we can make sure that our medication list is the same as yours.   

## 2022-09-16 DIAGNOSIS — E1121 Type 2 diabetes mellitus with diabetic nephropathy: Secondary | ICD-10-CM | POA: Diagnosis not present

## 2022-09-16 DIAGNOSIS — I152 Hypertension secondary to endocrine disorders: Secondary | ICD-10-CM | POA: Diagnosis not present

## 2022-09-16 DIAGNOSIS — E1159 Type 2 diabetes mellitus with other circulatory complications: Secondary | ICD-10-CM | POA: Diagnosis not present

## 2022-09-16 DIAGNOSIS — E1169 Type 2 diabetes mellitus with other specified complication: Secondary | ICD-10-CM | POA: Diagnosis not present

## 2022-09-16 DIAGNOSIS — E785 Hyperlipidemia, unspecified: Secondary | ICD-10-CM | POA: Diagnosis not present

## 2022-09-16 DIAGNOSIS — Z794 Long term (current) use of insulin: Secondary | ICD-10-CM | POA: Diagnosis not present

## 2022-09-16 LAB — TSH: TSH: 2.63

## 2022-09-16 LAB — HEMOGLOBIN A1C: Hemoglobin A1C: 7.5

## 2022-09-19 ENCOUNTER — Encounter: Payer: Self-pay | Admitting: Family Medicine

## 2022-09-21 DIAGNOSIS — M25519 Pain in unspecified shoulder: Secondary | ICD-10-CM | POA: Diagnosis not present

## 2022-09-21 DIAGNOSIS — E1140 Type 2 diabetes mellitus with diabetic neuropathy, unspecified: Secondary | ICD-10-CM | POA: Diagnosis not present

## 2022-09-21 DIAGNOSIS — M24811 Other specific joint derangements of right shoulder, not elsewhere classified: Secondary | ICD-10-CM | POA: Diagnosis not present

## 2022-09-21 DIAGNOSIS — M5416 Radiculopathy, lumbar region: Secondary | ICD-10-CM | POA: Diagnosis not present

## 2022-10-26 ENCOUNTER — Other Ambulatory Visit: Payer: Medicare Other | Admitting: Pharmacist

## 2022-10-26 NOTE — Progress Notes (Unsigned)
10/27/2022 Name: Hannah Kirby MRN: [id number] DOB: 1945/10/01  Chief Complaint  Patient presents with   Medication Management    Hannah Kirby is a 77 y.o. year old female who presented for a telephone visit.   They were referred to the pharmacist by their PCP for assistance in managing diabetes.   Conversation limited today as patient reports husband helps with her medication administration and is not currently available. Requests to continue conversation further during our next call, including medication review  Subjective:  Care Team: Primary Care Provider: Malva Limes, MD ; Next Scheduled Visit: 03/13/2023 Neurologist: Gelene Mink, PA; Next Scheduled Visit: 02/03/2023 Endocrinologist: Erlene Quan, MD Nephrologist: Lamont Dowdy, MD; Next Scheduled Visit: 02/28/2023 GI Specialist: Midge Minium, MD; Next Scheduled Visit: 11/08/2022 Vascular Specialist: Annice Needy, MD; Next Scheduled Visit: 02/07/2023  Medication Access/Adherence  Current Pharmacy:  [facility] - [place], [place] - [address] [facility] [address] [place] [address] Phone: [phone] Fax: [phone]  [facility] [id number] [place], [place] - [address] [place] [address] Phone: [phone] Fax: [phone]  [id number] [place], [place] - [address] AT [facility] [address] [place] [address] Phone: [phone] Fax: [phone]   Patient reports affordability concerns with their medications: No  Patient reports access/transportation concerns to their pharmacy: No  Patient reports adherence concerns with their medications:  No  '  Reports uses weekly pillbox to organize her medications. Reports husband helps her with preparing her Lantus injection.   Diabetes:  Note patient followed by [facility] - From review of latest office  visit from 09/16/2022, note provider recommended "If fasting numbers don't continue to come down, increase the lantus by a unit or two"  Current medications:  - Ozempic 0.5 mg weekly - Farxiga 10 mg daily - Lantus 22 units daily  Medications tried in the past:   Current glucose readings: morning fasting ranging 140-150  Patient denies hypoglycemic s/sx including dizziness, shakiness, sweating.   Current medication access support: None. Reports medications affordable through [facility]   Objective:  Lab Results  Component Value Date   HGBA1C 6.9 03/18/2022    Lab Results  Component Value Date   CREATININE 1.80 (H) 04/08/2022   BUN 23 04/08/2022   NA 141 04/08/2022   K 4.4 04/08/2022   CL 101 04/08/2022   CO2 26 04/08/2022    Lab Results  Component Value Date   CHOL 160 04/08/2022   HDL 62 04/08/2022   LDLCALC 73 04/08/2022   TRIG 148 04/08/2022   CHOLHDL 2.6 04/08/2022    Medications Reviewed Today     Reviewed by Manuela Neptune, RPH-CPP (Pharmacist) on 10/27/22 at 0808  Med List Status: <None>   Medication Order Taking? Sig Documenting Provider Last Dose Status Informant  acetaminophen (TYLENOL) 500 MG tablet [id number]  Take 500 mg by mouth every 6 (six) hours as needed. [provider]  Active Self  albuterol (VENTOLIN HFA) 108 (90 Base) MCG/ACT inhaler [id number]  Inhale 2 puffs into the lungs every 4 (four) hours as needed for wheezing or shortness of breath. Malva Limes, MD  Active   aspirin (ASPIR-81) 81 MG EC tablet [id number]  Take 81 mg by mouth daily. [provider]  Active Self  calcium carbonate (TUMS - DOSED IN MG ELEMENTAL CALCIUM) 500 MG chewable tablet [id number]  Chew 1 tablet by mouth daily. [provider]  Active   clopidogrel (PLAVIX) 75 MG tablet [id number]  Take 1 tablet (75 mg total) by mouth daily. Antonieta Iba, MD  Active   cyanocobalamin 1000 MCG tablet [id number]  Take 1,000 mcg by mouth daily.  [provider]  Active Self  dapagliflozin propanediol (FARXIGA) 10 MG TABS tablet [id number] Yes Take 10 mg by mouth daily. [provider] Taking Active   Docusate Sodium (DSS) 100 MG CAPS [id number]  Take by mouth. [provider]  Active   ezetimibe (ZETIA) 10 MG tablet [id number]  Take 1 tablet (10 mg total) by mouth daily. Antonieta Iba, MD  Active   Fluticasone-Umeclidin-Vilant (TRELEGY ELLIPTA) 100-62.5-25 MCG/ACT AEPB [id number]  Inhale 1 puff into the lungs daily. Malva Limes, MD  Active            Med Note Constance Haw, [facility]   Mon May 02, 2022 11:06 AM) Pending delivery  gabapentin (NEURONTIN) 100 MG capsule [id number]  Take 1 capsule by mouth at bedtime. [provider]  Active   HYDROcodone-acetaminophen (NORCO) 7.5-325 MG tablet [id number]  Take 1 tablet by mouth every 6 (six) hours as needed for moderate pain. Malva Limes, MD  Active   insulin glargine (LANTUS) 100 UNIT/ML injection [id number]  Inject 22 Units into the skin daily. [provider]  Active Self           Med Note Margrett Rud   Tue Aug 06, 2019  9:01 AM)    lisinopril (ZESTRIL) 20 MG tablet [id number]  Take 1 tablet (20 mg total) by mouth daily. Antonieta Iba, MD  Active   meclizine (ANTIVERT) 25 MG tablet [id number]  Take 25 mg by mouth 3 (three) times daily as needed. [provider]  Active   metoprolol tartrate (LOPRESSOR) 100 MG tablet [id number]  Take 1 tablet (100 mg total) by mouth 2 (two) times daily. Antonieta Iba, MD  Active   Multiple Vitamins-Minerals (DAILY MULTI) TABS [id number]  Take 1 tablet by mouth daily. [provider]  Active Self  nitroGLYCERIN (NITROSTAT) 0.4 MG SL tablet [id number]  Place 1 tablet (0.4 mg total) under the tongue every 5 (five) minutes as needed. Antonieta Iba, MD  Active   Omega-3 Fatty Acids (FISH OIL) 1000 MG CAPS [id number]  Take 2 capsules by mouth in the morning and at bedtime.  [provider]  Active Self  ondansetron (ZOFRAN ODT) 4 MG disintegrating tablet [id number]  Take 1 tablet (4 mg total) by mouth every 8 (eight) hours as needed for nausea or vomiting. Nita Sickle, MD  Active Self           Med Note Margrett Rud   Tue Aug 06, 2019  9:01 AM)    pregabalin (LYRICA) 25 MG capsule [id number]  Take 25 mg by mouth daily. [provider]  Active   RABEprazole (ACIPHEX) 20 MG tablet [id number]  Take 1 tablet (20 mg total) by mouth daily. 15 Mins. before evening meal Midge Minium, MD  Active   rosuvastatin (CRESTOR) 20 MG tablet [id number]  Take 1 tablet (20 mg total) by mouth daily. Antonieta Iba, MD  Active   Semaglutide,0.25 or 0.5MG /DOS, 2 MG/1.5ML Namon Cirri [id number] Yes Inject 0.25 mg into the skin once a week. [provider] Taking Active            Med Note Constance Haw, [facility]   Mon May 02, 2022 11:10 AM) Reports taking 0.5 mg weekly  sucralfate (CARAFATE) 1 g tablet [id number]  Take 1 tablet (1 g total) by mouth 2 (two) times daily. 15 Minutes before evening meal and at bed time Midge Minium, MD  Active   tiZANidine (ZANAFLEX) 4 MG tablet [id number]  Take 4 mg by mouth at bedtime. [provider]  Active               Assessment/Plan:   Orlene Erm limited today as patient reports husband helps with her medication administration and is not currently available. Requests to continue conversation further during our next call, including medication review  Diabetes: - Patient to have regular well-balanced meals while controlling carbohydrate portion sizes - Encourage patient to follow directions from Endocrinology office - Recommend to check glucose, keep log of results and have this record to review at upcoming medical appointments. Patient to contact Endocrinology office sooner if needed for readings outside of established parameters or symptoms - During next appointment, will discuss with patient option to add continuous  glucose monitoring for blood sugar control/feedback on dietary choices   Follow Up Plan: Clinical Pharmacist will follow up with patient by telephone on 11/30/2022 at 11:30 am  Estelle Grumbles, PharmD, [facility] [phone]

## 2022-10-27 NOTE — Patient Instructions (Signed)
Goals Addressed             This Visit's Progress    Pharmacy Goals       It was a pleasure speaking with you today!  Our next appointment is scheduled for 11/30/2022 at 11:30 AM. Please have your medications as well your recent home blood sugar and blood pressure readings available for [place] to review during this call.  Thank you!  Estelle Grumbles, PharmD, [facility] [phone]

## 2022-11-02 ENCOUNTER — Telehealth: Payer: Self-pay

## 2022-11-02 NOTE — Telephone Encounter (Signed)
Patient called and wanted to know if she could get the flu and COVID shot at the pharmacy. Advised she can be scheduled for the flu here in the office and the COVID to go to the pharmacy for that one 1 week later. Scheduled for Friday 11/25/22.

## 2022-11-08 ENCOUNTER — Ambulatory Visit (INDEPENDENT_AMBULATORY_CARE_PROVIDER_SITE_OTHER): Payer: Medicare Other | Admitting: Gastroenterology

## 2022-11-08 VITALS — BP 170/112 | HR 70 | Temp 97.7°F | Wt 148.0 lb

## 2022-11-08 DIAGNOSIS — K219 Gastro-esophageal reflux disease without esophagitis: Secondary | ICD-10-CM

## 2022-11-08 DIAGNOSIS — K2270 Barrett's esophagus without dysplasia: Secondary | ICD-10-CM

## 2022-11-08 MED ORDER — RABEPRAZOLE SODIUM 20 MG PO TBEC: 20.0000 mg | DELAYED_RELEASE_TABLET | Freq: Every day | ORAL | 3 refills | Status: AC

## 2022-11-08 MED ORDER — SUCRALFATE 1 G PO TABS: 1.0000 g | ORAL_TABLET | Freq: Two times a day (BID) | ORAL | 3 refills | Status: AC

## 2022-11-08 NOTE — Progress Notes (Signed)
Primary Care Physician: Malva Limes, MD  Primary Gastroenterologist:  Dr. Midge Minium  Chief Complaint  Patient presents with   Follow-up   Barrett's Esophagus   Gastroesophageal Reflux    HPI: Hannah Kirby is a 77 y.o. female here for follow-up of GERD with a need for medication refills.  The patient has been on Aciphex and sacral fate and states that her symptoms are well-controlled.  The patient states that she did not follow-up with a repeat EGD for her Barrett's esophagus because her husband was very sick.  The patient denies any unexplained weight loss fevers chills nausea vomiting black stools or bloody stools.  Past Medical History:  Diagnosis Date   Acute hemorrhagic colitis 08/08/2019   Anemia    Anxiety    Arthritis    C. difficile colitis 08/09/2019   CAD (coronary artery disease)    a. 02/2006 PCI: BMS x 2 to RCA, cath o/w without significant coronary disease; b. nuclear stress test 07/2014: No ischemia/infarct; c. 11/2017 MV: no isch/infarct, EF 55-65%; d. 03/2020 NSTEMI/PCI: LM nl, LAD min irregs, RI 25, small, LCX nl, RCA 30p/m ISR, 99d (3.0x15 Resolute Onyx DES).   Chronic bronchitis (HCC)    secondary to cigarette smoking   Chronic kidney disease (CKD), stage III (moderate) (HCC)    COPD (chronic obstructive pulmonary disease) (HCC)    Diabetes mellitus    Diastolic dysfunction    a. 07/2019 Echo: EF 60-65%, no rwma, mod LVH. Nl RV size/fxn; b. 03/2020 Echo: EF 60-65%, mod LVH, gr1 DD, nl RV size/fxn, mild AS.   FHx: allergies    GERD (gastroesophageal reflux disease)    Goiter    Granulomatous disease (HCC)    Hernia    Hyperlipidemia    Hypertension    Kidney stone on left side 2013   Microalbuminuria    NSTEMI (non-ST elevated myocardial infarction) (HCC) 03/23/2020   Obesity    Panic attacks    PVC's (premature ventricular contractions)    a. 03/2018 [employer]: Occas PVCs (2.5%). Triggered events assoc w/ PVC/PAC.   Retinal tear 2020   Smokers'  cough (HCC)    Spinal stenosis    Stroke (HCC) 10/29/2016   mild left side weakness   Stroke (HCC) 08/01/2019   Tobacco abuse     Current Outpatient Medications  Medication Sig Dispense Refill   acetaminophen (TYLENOL) 500 MG tablet Take 500 mg by mouth every 6 (six) hours as needed.     albuterol (VENTOLIN HFA) 108 (90 Base) MCG/ACT inhaler Inhale 2 puffs into the lungs every 4 (four) hours as needed for wheezing or shortness of breath. 48 g 3   aspirin (ASPIR-81) 81 MG EC tablet Take 81 mg by mouth daily.     calcium carbonate (TUMS - DOSED IN MG ELEMENTAL CALCIUM) 500 MG chewable tablet Chew 1 tablet by mouth daily.     clopidogrel (PLAVIX) 75 MG tablet Take 1 tablet (75 mg total) by mouth daily. 90 tablet 3   cyanocobalamin 1000 MCG tablet Take 1,000 mcg by mouth daily.     dapagliflozin propanediol (FARXIGA) 10 MG TABS tablet Take 10 mg by mouth daily.     Docusate Sodium (DSS) 100 MG CAPS Take by mouth.     ezetimibe (ZETIA) 10 MG tablet Take 1 tablet (10 mg total) by mouth daily. 90 tablet 3   Fluticasone-Umeclidin-Vilant (TRELEGY ELLIPTA) 100-62.5-25 MCG/ACT AEPB Inhale 1 puff into the lungs daily. 90 each 3   gabapentin (NEURONTIN)  100 MG capsule Take 1 capsule by mouth at bedtime.     HYDROcodone-acetaminophen (NORCO) 7.5-325 MG tablet Take 1 tablet by mouth every 6 (six) hours as needed for moderate pain. 120 tablet 0   insulin glargine (LANTUS) 100 UNIT/ML injection Inject 22 Units into the skin daily.     lisinopril (ZESTRIL) 20 MG tablet Take 1 tablet (20 mg total) by mouth daily. 90 tablet 3   meclizine (ANTIVERT) 25 MG tablet Take 25 mg by mouth 3 (three) times daily as needed.     metoprolol tartrate (LOPRESSOR) 100 MG tablet Take 1 tablet (100 mg total) by mouth 2 (two) times daily. 180 tablet 3   Multiple Vitamins-Minerals (DAILY MULTI) TABS Take 1 tablet by mouth daily.     nitroGLYCERIN (NITROSTAT) 0.4 MG SL tablet Place 1 tablet (0.4 mg total) under the tongue every 5  (five) minutes as needed. 25 tablet 3   Omega-3 Fatty Acids (FISH OIL) 1000 MG CAPS Take 2 capsules by mouth in the morning and at bedtime.      ondansetron (ZOFRAN ODT) 4 MG disintegrating tablet Take 1 tablet (4 mg total) by mouth every 8 (eight) hours as needed for nausea or vomiting. 20 tablet 0   pregabalin (LYRICA) 25 MG capsule Take 25 mg by mouth daily.     rosuvastatin (CRESTOR) 20 MG tablet Take 1 tablet (20 mg total) by mouth daily. 90 tablet 3   Semaglutide,0.25 or 0.5MG /DOS, 2 MG/1.5ML SOPN Inject 0.25 mg into the skin once a week.     tiZANidine (ZANAFLEX) 4 MG tablet Take 4 mg by mouth at bedtime.     RABEprazole (ACIPHEX) 20 MG tablet Take 1 tablet (20 mg total) by mouth daily. 15 Mins. before evening meal 90 tablet 3   sucralfate (CARAFATE) 1 g tablet Take 1 tablet (1 g total) by mouth 2 (two) times daily. 15 Minutes before evening meal and at bed time 180 tablet 3   No current facility-administered medications for this visit.    Allergies as of 11/08/2022 - Review Complete 11/08/2022  Allergen Reaction Noted   Spironolactone Shortness Of Breath 10/05/2018   Sulfa antibiotics Other (See Comments) and Rash 10/22/2013   Sulfonamide derivatives Other (See Comments)    Other Other (See Comments) 04/27/2015   Codeine Nausea And Vomiting and Other (See Comments) 04/27/2015   Prednisone Palpitations and Other (See Comments) 10/04/2011    ROS:  General: Negative for anorexia, weight loss, fever, chills, fatigue, weakness. ENT: Negative for hoarseness, difficulty swallowing , nasal congestion. CV: Negative for chest pain, angina, palpitations, dyspnea on exertion, peripheral edema.  Respiratory: Negative for dyspnea at rest, dyspnea on exertion, cough, sputum, wheezing.  GI: See history of present illness. GU:  Negative for dysuria, hematuria, urinary incontinence, urinary frequency, nocturnal urination.  Endo: Negative for unusual weight change.    Physical Examination:    BP (!) 170/112 (BP Location: Right Arm, Patient Position: Sitting, Cuff Size: Normal)   Pulse 70   Temp 97.7 F (36.5 C) (Oral)   Wt 148 lb (67.1 kg)   BMI 24.63 kg/m   General: Well-nourished, well-developed in no acute distress.  Eyes: No icterus. Conjunctivae pink. Neuro: Alert and oriented x 3.  Grossly intact. Skin: Warm and dry, no jaundice.   Psych: Alert and cooperative, normal mood and affect.  Labs:    Imaging Studies: No results found.  Assessment and Plan:   Hannah Kirby is a 77 y.o. y/o female who comes in today  with a history of Barrett's esophagus and GERD.  The patient denies any worry symptoms at the present time without any dysphagia black stools or bloody stools.  The patient needs a refill of her medication and will have the refill sent in.  The patient has been told about the risk of esophageal cancer Barrett's esophagus and would continue to hold off on any endoscopies at this time.  The patient has been explained the plan and agrees with it.     Midge Minium, MD. Clementeen Graham    Note: This dictation was prepared with Dragon dictation along with smaller phrase technology. Any transcriptional errors that result from this process are unintentional.

## 2022-11-24 ENCOUNTER — Other Ambulatory Visit: Payer: Self-pay | Admitting: Family Medicine

## 2022-11-25 ENCOUNTER — Ambulatory Visit (INDEPENDENT_AMBULATORY_CARE_PROVIDER_SITE_OTHER): Payer: Medicare Other | Admitting: Family Medicine

## 2022-11-25 DIAGNOSIS — Z23 Encounter for immunization: Secondary | ICD-10-CM

## 2022-11-25 MED ORDER — HYDROCODONE-ACETAMINOPHEN 7.5-325 MG PO TABS: 1.0000 | ORAL_TABLET | Freq: Four times a day (QID) | ORAL | 0 refills | Status: DC | PRN

## 2022-11-25 NOTE — Progress Notes (Signed)
Vaccine administration only. No E&M service today.   

## 2022-11-30 ENCOUNTER — Other Ambulatory Visit: Payer: Medicare Other | Admitting: Pharmacist

## 2022-11-30 NOTE — Patient Instructions (Signed)
Goals Addressed             This Visit's Progress    Pharmacy Goals       It was a pleasure speaking with you today!  The goal A1c is less than 7%. This is the best way to reduce the risk of the long term complications of diabetes, including heart disease, kidney disease, eye disease, strokes, and nerve damage. An A1c of less than 7% corresponds with fasting sugars less than 130 and 2 hour after meal sugars less than 180. Please check your blood sugar daily.   Check your blood pressure twice weekly, and any time you have concerning symptoms like headache, chest pain, dizziness, shortness of breath, or vision changes.   Our goal is less than 130/80.  To appropriately check your blood pressure, make sure you do the following:  1) Avoid caffeine, exercise, or tobacco products for 30 minutes before checking. Empty your bladder. 2) Sit with your back supported in a flat-backed chair. Rest your arm on something flat (arm of the chair, table, etc). 3) Sit still with your feet flat on the floor, resting, for at least 5 minutes.  4) Check your blood pressure. Take 1-2 readings.  5) Write down these readings and bring with you to any provider appointments.  Bring your home blood pressure machine with you to a provider's office for accuracy comparison at least once a year.   Make sure you take your blood pressure medications before you come to any office visit, even if you were asked to fast for labs.   Thank you!  Estelle Grumbles, PharmD, [facility] [phone]

## 2022-11-30 NOTE — Progress Notes (Signed)
Error: Unable to retrieve note

## 2022-11-30 NOTE — Progress Notes (Signed)
11/30/2022 Name: Hannah Kirby MRN: [id number] DOB: [birth year]  Chief Complaint  Patient presents with   Medication Management    Gerldine Crayne List is a 77 y.o. year old female who presented for a telephone visit.   They were referred to the pharmacist by their PCP for assistance in managing diabetes.    Subjective:  Care Team: Primary Care Provider: Malva Limes, MD ; Next Scheduled Visit: 03/13/2023 Neurologist: Gelene Mink, PA; Next Scheduled Visit: 02/03/2023 Endocrinologist: Erlene Quan, MD; Next Scheduled Visit: 03/24/2023 Nephrologist: Lamont Dowdy, MD; Next Scheduled Visit: 03/01/2023 GI Specialist: Midge Minium, MD Vascular Specialist: Annice Needy, MD; Next Scheduled Visit: 02/07/2023 Cardiologist: Antonieta Iba, MD  Orthopedics: [facility]  Medication Access/Adherence  Current Pharmacy:  [facility] - [place], [place] - [address] [facility] [address] [place] [address] Phone: [phone] Fax: [phone]  [facility] [id number] - [place], [place] - [address] [place] [address] Phone: [phone] Fax: [phone]  [id number] [place], [place] - [address] AT [facility] [address] [place] [address] Phone: [phone] Fax: [phone]   Patient reports affordability concerns with their medications: No  Patient reports access/transportation concerns to their pharmacy: No  Patient reports adherence concerns with their medications:  No  '   Denies using weekly pillbox recently; reports has been taking her medications directly from pill bottles. Reports husband helps her with preparing her Lantus injection.  Reports medications affordable. Note patient primarily uses [facility]   Diabetes:   Note patient followed by [facility]   Current medications:  - Ozempic 0.5 mg weekly on  Sundays - dapagliflozin 10 mg daily - Lantus 20 units daily    Reports blood sugar readings were elevated last month after received a steroid injection, but have since improved  Current glucose readings: last checked morning fasting on 10/6: 136   Patient denies hypoglycemic s/sx including dizziness, shakiness, sweating.   Current physical activity: limited to movement around the house  Lipid management: rosuvastatin 20 mg daily + ezetimibe 10 mg daily   Current medication access support: None. Reports medications affordable through [facility]   Hypertension:  Current medications:  - lisinopril 20 mg daily - metoprolol 100 mg twice daily  Patient does not have an upper arm home BP cuff; has wrist monitor Denies checking recently  Patient denies hypotensive s/sx including dizziness, lightheadedness.   Current physical activity: limited to movement around the house    Objective:  Lab Results  Component Value Date   HGBA1C 6.9 03/18/2022    Lab Results  Component Value Date   CREATININE 1.80 (H) 04/08/2022   BUN 23 04/08/2022   NA 141 04/08/2022   K 4.4 04/08/2022   CL 101 04/08/2022   CO2 26 04/08/2022    Lab Results  Component Value Date   CHOL 160 04/08/2022   HDL 62 04/08/2022   LDLCALC 73 04/08/2022   TRIG 148 04/08/2022   CHOLHDL 2.6 04/08/2022   BP Readings from Last 3 Encounters:  11/08/22 (!) 170/112  09/09/22 (!) 172/104  08/16/22 124/74   Pulse Readings from Last 3 Encounters:  11/08/22 70  09/09/22 62  08/16/22 64     Medications Reviewed Today     Reviewed by Manuela Neptune, RPH-CPP (Pharmacist) on 11/30/22 at 1203  Med List Status: <None>   Medication Order Taking?  Sig Documenting Provider Last Dose Status Informant  acetaminophen (TYLENOL) 500 MG tablet [id number] Yes Take 500 mg by mouth every 6 (six) hours as needed. [provider] Taking Active Self  albuterol (VENTOLIN HFA) 108 (90 Base) MCG/ACT inhaler  [id number] Yes Inhale 2 puffs into the lungs every 4 (four) hours as needed for wheezing or shortness of breath. Malva Limes, MD Taking Active   aspirin (ASPIR-81) 81 MG EC tablet [id number] Yes Take 81 mg by mouth daily. [provider] Taking Active Self  calcium carbonate (TUMS - DOSED IN MG ELEMENTAL CALCIUM) 500 MG chewable tablet [id number] Yes Chew 1 tablet by mouth daily as needed. [provider] Taking Active   clopidogrel (PLAVIX) 75 MG tablet [id number] Yes Take 1 tablet (75 mg total) by mouth daily. Antonieta Iba, MD Taking Active   cyanocobalamin 1000 MCG tablet [id number] Yes Take 1,000 mcg by mouth daily. [provider] Taking Active Self  dapagliflozin propanediol (FARXIGA) 10 MG TABS tablet [id number] Yes Take 10 mg by mouth daily. [provider] Taking Active   Docusate Sodium (DSS) 100 MG CAPS [id number] Yes Take 1 capsule by mouth daily. [provider] Taking Active   ezetimibe (ZETIA) 10 MG tablet [id number] Yes Take 1 tablet (10 mg total) by mouth daily. Antonieta Iba, MD Taking Active   Fluticasone-Umeclidin-Vilant [facility] ELLIPTA) 100-62.5-25 MCG/ACT AEPB [id number] Yes Inhale 1 puff into the lungs daily. Malva Limes, MD Taking Active            Med Note Ronney Asters, [facility]   Wed Nov 30, 2022 11:44 AM)    gabapentin (NEURONTIN) 100 MG capsule [id number] Yes Take 1 capsule by mouth at bedtime as needed. [provider] Taking Active   HYDROcodone-acetaminophen (NORCO) 7.5-325 MG tablet [id number] Yes Take 1 tablet by mouth every 6 (six) hours as needed for moderate pain. Malva Limes, MD Taking Active   insulin glargine (LANTUS) 100 UNIT/ML injection [id number] Yes Inject 20 Units into the skin daily. [provider] Taking Active Self           Med Note Margrett Rud   Tue Aug 06, 2019  9:01 AM)    lisinopril (ZESTRIL) 20 MG tablet [id number] Yes Take 1 tablet (20 mg total) by mouth daily.  Antonieta Iba, MD Taking Active   meclizine (ANTIVERT) 25 MG tablet [id number]  Take 25 mg by mouth 3 (three) times daily as needed. [provider]  Active   metoprolol tartrate (LOPRESSOR) 100 MG tablet [id number] Yes Take 1 tablet (100 mg total) by mouth 2 (two) times daily. Antonieta Iba, MD Taking Active   Multiple Vitamins-Minerals (DAILY MULTI) TABS [id number] Yes Take 1 tablet by mouth daily. [provider] Taking Active Self  nitroGLYCERIN (NITROSTAT) 0.4 MG SL tablet [id number]  Place 1 tablet (0.4 mg total) under the tongue every 5 (five) minutes as needed. Antonieta Iba, MD  Active   Omega-3 Fatty Acids (FISH OIL) 1000 MG CAPS [id number] Yes Take 2 capsules by mouth in the morning and at bedtime.  [provider] Taking Active Self  ondansetron (ZOFRAN ODT) 4 MG disintegrating tablet [id number] No Take 1 tablet (4 mg total) by mouth every 8 (eight) hours as needed for nausea or vomiting.  Patient not taking: Reported on 11/30/2022   Nita Sickle, MD Not Taking Active Self           Med Note Margrett Rud   Tue Aug 06, 2019  9:01 AM)    pregabalin (LYRICA) 25 MG capsule [id number] Yes Take 25 mg by mouth 2 (two) times daily as needed. [provider] Taking Active   RABEprazole (ACIPHEX) 20 MG tablet [id number] Yes Take 1 tablet (20 mg total) by mouth daily. 15 Mins. before evening meal Midge Minium, MD Taking Active   rosuvastatin (CRESTOR) 20 MG tablet [id number] Yes Take 1 tablet (20 mg total) by mouth daily. Antonieta Iba, MD Taking Active   Semaglutide,0.25 or 0.5MG /DOS, 2 MG/1.5ML Namon Cirri [id number] Yes Inject 0.25 mg into the skin once a week. [provider] Taking Active            Med Note Constance Haw, Darron Doom May 02, 2022 11:10 AM) Reports taking 0.5 mg weekly  sucralfate (CARAFATE) 1 g tablet [id number] Yes Take 1 tablet (1 g total) by mouth 2 (two) times daily. 15 Minutes before evening meal and at bed time Midge Minium, MD Taking Active               Assessment/Plan:   Encourage patient to restart using weekly pillbox   Comprehensive medication review performed; medication list updated in electronic medical record - Caution patient for risk of dizziness/sedation with taking gabapentin, Norco, meclizine and pregabalin, particularly if taken in combination  Patient verbalizes understanding and reports using each of these only as needed  Reports rarely using meclizine Reports uses gabapentin only as needed at night and pregabalin only as needed during the day (reports both prescribed by [facility] provider) - Confirms rinses out her mouth after each use of Trelegy - Reports separates sucralfate from other medications  - Patient to contact pharmacy for refill of her Nitrostat  Diabetes: - Currently controlled - Reviewed long term cardiovascular and renal outcomes of uncontrolled blood sugar - Reviewed goal A1c, goal fasting, and goal 2 hour post prandial glucose - Encourage patient to have regular well-balanced meals while controlling carbohydrate portion sizes  Encourage patient to cut back on consumption of sugary beverages (sweet tea) - Encourage patient to follow directions from Endocrinology office - Recommend to check glucose every day, keep log of results and have this record to review at upcoming medical appointments. Patient to contact Endocrinology office sooner if needed for readings outside of established parameters or symptoms   Hypertension: - Currently uncontrolled - Reviewed long term cardiovascular and renal outcomes of uncontrolled blood pressure - Reviewed appropriate blood pressure monitoring technique and reviewed goal blood pressure.  - Encourage patient to consider obtaining upper arm blood pressure monitor for greater accuracy of device (compared to wrist monitor) - Recommended to restart checking home blood pressure and heart rate, keep log of results and have this  record to review at upcoming medical appointments. Patient to contact provider office sooner if needed for readings outside of established parameters or symptoms   Follow Up Plan:   Patient denies further medication questions or concerns today Provide patient with contact information for clinic pharmacist to contact if needed in future for medication questions/concerns   Estelle Grumbles, PharmD, [facility] [phone]

## 2022-12-29 ENCOUNTER — Telehealth: Payer: Self-pay

## 2022-12-29 NOTE — Patient Outreach (Signed)
  Care Management   Outreach Note  12/29/2022 Name: Hannah Kirby MRN: [id number] DOB: 13-Dec-1945  An unsuccessful telephone outreach was attempted today to contact the patient about Care Management needs.     Follow Up Plan:  A HIPAA compliant phone message was left for the patient providing contact information and requesting a return call.    Katina [facility]  [facility], [facility] RN Care Manager Direct Dial: [phone] Website: [email or web address]

## 2023-01-10 ENCOUNTER — Other Ambulatory Visit: Payer: Self-pay

## 2023-01-10 NOTE — Patient Outreach (Signed)
  Care Management   Visit Note  01/10/2023 Name: Hannah Kirby MRN: [id number] DOB: [birth year]  Subjective: Hannah Kirby is a 77 y.o. year old female who is a primary care patient of Fisher, Demetrios Isaacs, MD. The Care Management team was consulted for assistance.      Engaged with [name] via telephone regarding Care Management outreach. Anticipates being available to complete telephonic outreach on 01/24/23. Denies urgent concerns however she did require assistance with obtaining [place]. [facility] was contacted today regarding need for mailed refill. Team confirmed medication will be mailed.   PLAN Appointment scheduled for 01/24/23 at 1400.   Katina Degree [facility]  [facility], [facility] RN Care Manager Direct Dial: [phone] Website: [email or web address]

## 2023-01-24 ENCOUNTER — Other Ambulatory Visit: Payer: Self-pay

## 2023-01-29 NOTE — Patient Outreach (Signed)
Care Management   Visit Note   Name: Hannah Kirby MRN: [id number] DOB: Jun 26, 1945  Subjective: Hannah Kirby is a 77 y.o. year old female who is a primary care patient of Fisher, Demetrios Isaacs, MD. The Care Management team was consulted for assistance.      Engaged with Hannah Kirby via telephone  Assessment:  Review of patient past medical history, allergies, medications, health status, including review of consultants reports, laboratory and other test data, was performed as part of  evaluation and provision of care management services.    Outpatient Encounter Medications as of 01/24/2023  Medication Sig Note   acetaminophen (TYLENOL) 500 MG tablet Take 500 mg by mouth every 6 (six) hours as needed.    albuterol (VENTOLIN HFA) 108 (90 Base) MCG/ACT inhaler Inhale 2 puffs into the lungs every 4 (four) hours as needed for wheezing or shortness of breath.    aspirin (ASPIR-81) 81 MG EC tablet Take 81 mg by mouth daily.    calcium carbonate (TUMS - DOSED IN MG ELEMENTAL CALCIUM) 500 MG chewable tablet Chew 1 tablet by mouth daily as needed.    clopidogrel (PLAVIX) 75 MG tablet Take 1 tablet (75 mg total) by mouth daily.    cyanocobalamin 1000 MCG tablet Take 1,000 mcg by mouth daily.    dapagliflozin propanediol (FARXIGA) 10 MG TABS tablet Take 10 mg by mouth daily. 01/10/2023: Contacted [facility]/Needs medication mailed   ezetimibe (ZETIA) 10 MG tablet Take 1 tablet (10 mg total) by mouth daily.    Fluticasone-Umeclidin-Vilant (TRELEGY ELLIPTA) 100-62.5-25 MCG/ACT AEPB Inhale 1 puff into the lungs daily.    HYDROcodone-acetaminophen (NORCO) 7.5-325 MG tablet Take 1 tablet by mouth every 6 (six) hours as needed for moderate pain.    lisinopril (ZESTRIL) 20 MG tablet Take 1 tablet (20 mg total) by mouth daily.    meclizine (ANTIVERT) 25 MG tablet Take 25 mg by mouth 3 (three) times daily as needed.    metoprolol tartrate (LOPRESSOR) 100 MG tablet Take 1 tablet (100 mg total) by mouth 2 (two)  times daily.    Multiple Vitamins-Minerals (DAILY MULTI) TABS Take 1 tablet by mouth daily.    Omega-3 Fatty Acids (FISH OIL) 1000 MG CAPS Take 2 capsules by mouth in the morning and at bedtime.     RABEprazole (ACIPHEX) 20 MG tablet Take 1 tablet (20 mg total) by mouth daily. 15 Mins. before evening meal    rosuvastatin (CRESTOR) 20 MG tablet Take 1 tablet (20 mg total) by mouth daily.    Semaglutide,0.25 or 0.5MG /DOS, 2 MG/1.5ML SOPN Inject 0.25 mg into the skin once a week. 05/02/2022: Reports taking 0.5 mg weekly   sucralfate (CARAFATE) 1 g tablet Take 1 tablet (1 g total) by mouth 2 (two) times daily. 15 Minutes before evening meal and at bed time    Docusate Sodium (DSS) 100 MG CAPS Take 1 capsule by mouth daily.    gabapentin (NEURONTIN) 100 MG capsule Take 1 capsule by mouth at bedtime as needed. (Patient not taking: Reported on 01/24/2023)    insulin glargine (LANTUS) 100 UNIT/ML injection Inject 20 Units into the skin daily. 01/24/2023: Reports dose was increased to 22 units   nitroGLYCERIN (NITROSTAT) 0.4 MG SL tablet Place 1 tablet (0.4 mg total) under the tongue every 5 (five) minutes as needed.    ondansetron (ZOFRAN ODT) 4 MG disintegrating tablet Take 1 tablet (4 mg total) by mouth every 8 (eight) hours as needed for nausea or vomiting. (Patient not taking:  Reported on 11/30/2022)    pregabalin (LYRICA) 25 MG capsule Take 25 mg by mouth 2 (two) times daily as needed. 01/24/2023: Reports taking as needed   No facility-administered encounter medications on file as of 01/24/2023.

## 2023-01-30 ENCOUNTER — Other Ambulatory Visit: Payer: Self-pay | Admitting: Family Medicine

## 2023-01-30 MED ORDER — HYDROCODONE-ACETAMINOPHEN 7.5-325 MG PO TABS: 1.0000 | ORAL_TABLET | Freq: Four times a day (QID) | ORAL | 0 refills | Status: DC | PRN

## 2023-02-03 ENCOUNTER — Other Ambulatory Visit: Payer: Self-pay | Admitting: Physician Assistant

## 2023-02-03 DIAGNOSIS — G459 Transient cerebral ischemic attack, unspecified: Secondary | ICD-10-CM | POA: Diagnosis not present

## 2023-02-03 DIAGNOSIS — R531 Weakness: Secondary | ICD-10-CM | POA: Diagnosis not present

## 2023-02-03 DIAGNOSIS — M545 Low back pain, unspecified: Secondary | ICD-10-CM | POA: Diagnosis not present

## 2023-02-03 DIAGNOSIS — R519 Headache, unspecified: Secondary | ICD-10-CM | POA: Diagnosis not present

## 2023-02-03 DIAGNOSIS — R270 Ataxia, unspecified: Secondary | ICD-10-CM | POA: Diagnosis not present

## 2023-02-03 DIAGNOSIS — Z8673 Personal history of transient ischemic attack (TIA), and cerebral infarction without residual deficits: Secondary | ICD-10-CM | POA: Diagnosis not present

## 2023-02-03 DIAGNOSIS — R202 Paresthesia of skin: Secondary | ICD-10-CM | POA: Diagnosis not present

## 2023-02-03 DIAGNOSIS — R410 Disorientation, unspecified: Secondary | ICD-10-CM | POA: Diagnosis not present

## 2023-02-03 DIAGNOSIS — R200 Anesthesia of skin: Secondary | ICD-10-CM | POA: Diagnosis not present

## 2023-02-06 ENCOUNTER — Other Ambulatory Visit (INDEPENDENT_AMBULATORY_CARE_PROVIDER_SITE_OTHER): Payer: Self-pay | Admitting: Nurse Practitioner

## 2023-02-06 DIAGNOSIS — I714 Abdominal aortic aneurysm, without rupture, unspecified: Secondary | ICD-10-CM

## 2023-02-07 ENCOUNTER — Ambulatory Visit (INDEPENDENT_AMBULATORY_CARE_PROVIDER_SITE_OTHER): Admitting: Vascular Surgery

## 2023-02-07 ENCOUNTER — Other Ambulatory Visit (INDEPENDENT_AMBULATORY_CARE_PROVIDER_SITE_OTHER): Payer: Medicare Other

## 2023-02-08 ENCOUNTER — Other Ambulatory Visit: Payer: Self-pay

## 2023-02-08 NOTE — Patient Outreach (Signed)
Care Management   Visit Note  02/08/2023 Name: PEARLE GABEL MRN: [id number] DOB: 1945-06-16  Subjective: Hannah Kirby is a 77 y.o. year old female who is a primary care patient of Fisher, Demetrios Isaacs, MD. The Care Management team was consulted for assistance.      Engaged with Ms. Mezey via telephone.  Assessment:  Review of patient past medical history, allergies, medications, health status, including review of consultants reports, laboratory and other test data, was performed as part of  evaluation and provision of care management services.    Outpatient Encounter Medications as of 02/08/2023  Medication Sig Note   meclizine (ANTIVERT) 25 MG tablet Take 25 mg by mouth 3 (three) times daily as needed.    acetaminophen (TYLENOL) 500 MG tablet Take 500 mg by mouth every 6 (six) hours as needed.    albuterol (VENTOLIN HFA) 108 (90 Base) MCG/ACT inhaler Inhale 2 puffs into the lungs every 4 (four) hours as needed for wheezing or shortness of breath.    aspirin (ASPIR-81) 81 MG EC tablet Take 81 mg by mouth daily.    calcium carbonate (TUMS - DOSED IN MG ELEMENTAL CALCIUM) 500 MG chewable tablet Chew 1 tablet by mouth daily as needed.    clopidogrel (PLAVIX) 75 MG tablet Take 1 tablet (75 mg total) by mouth daily.    cyanocobalamin 1000 MCG tablet Take 1,000 mcg by mouth daily.    dapagliflozin propanediol (FARXIGA) 10 MG TABS tablet Take 10 mg by mouth daily. 01/10/2023: Contacted [facility]/Needs medication mailed   Docusate Sodium (DSS) 100 MG CAPS Take 1 capsule by mouth daily.    ezetimibe (ZETIA) 10 MG tablet Take 1 tablet (10 mg total) by mouth daily.    Fluticasone-Umeclidin-Vilant (TRELEGY ELLIPTA) 100-62.5-25 MCG/ACT AEPB Inhale 1 puff into the lungs daily.    gabapentin (NEURONTIN) 100 MG capsule Take 1 capsule by mouth at bedtime as needed. (Patient not taking: Reported on 01/24/2023)    HYDROcodone-acetaminophen (NORCO) 7.5-325 MG tablet Take 1 tablet by mouth every 6 (six)  hours as needed for moderate pain (pain score 4-6).    insulin glargine (LANTUS) 100 UNIT/ML injection Inject 20 Units into the skin daily. 01/24/2023: Reports dose was increased to 22 units   lisinopril (ZESTRIL) 20 MG tablet Take 1 tablet (20 mg total) by mouth daily.    metoprolol tartrate (LOPRESSOR) 100 MG tablet Take 1 tablet (100 mg total) by mouth 2 (two) times daily.    Multiple Vitamins-Minerals (DAILY MULTI) TABS Take 1 tablet by mouth daily.    nitroGLYCERIN (NITROSTAT) 0.4 MG SL tablet Place 1 tablet (0.4 mg total) under the tongue every 5 (five) minutes as needed.    Omega-3 Fatty Acids (FISH OIL) 1000 MG CAPS Take 2 capsules by mouth in the morning and at bedtime.     ondansetron (ZOFRAN ODT) 4 MG disintegrating tablet Take 1 tablet (4 mg total) by mouth every 8 (eight) hours as needed for nausea or vomiting. (Patient not taking: Reported on 11/30/2022)    pregabalin (LYRICA) 25 MG capsule Take 25 mg by mouth 2 (two) times daily as needed. 01/24/2023: Reports taking as needed   RABEprazole (ACIPHEX) 20 MG tablet Take 1 tablet (20 mg total) by mouth daily. 15 Mins. before evening meal    rosuvastatin (CRESTOR) 20 MG tablet Take 1 tablet (20 mg total) by mouth daily.    Semaglutide,0.25 or 0.5MG /DOS, 2 MG/1.5ML SOPN Inject 0.25 mg into the skin once a week. 05/02/2022: Reports taking 0.5 mg  weekly   sucralfate (CARAFATE) 1 g tablet Take 1 tablet (1 g total) by mouth 2 (two) times daily. 15 Minutes before evening meal and at bed time    No facility-administered encounter medications on file as of 02/08/2023.

## 2023-02-13 ENCOUNTER — Telehealth: Payer: Self-pay | Admitting: Cardiovascular Disease

## 2023-02-13 ENCOUNTER — Other Ambulatory Visit: Payer: Self-pay

## 2023-02-13 NOTE — Telephone Encounter (Signed)
Pt c/o BP issue: STAT if pt c/o blurred vision, one-sided weakness or slurred speech  1. What are your last 5 BP readings?  167/102  2. Are you having any other symptoms (ex. Dizziness, headache, blurred vision, passed out)? Having dizziness, and nausea.  Patient reported having blurry vision over the last week.  3. What is your BP issue? Having high BP.  Case manager did recommend for patient to go to urgent care, patient refused.  She said she can be reached even at 5pm and the number above.

## 2023-02-13 NOTE — Telephone Encounter (Signed)
Pt reports BP today of 167/102 then approx 4 hours later at about 4:30 pm pt reports BP 167/102.  Pt reports taking Lisinopril ad metoprolol this morning and will take another metoprolol this evening.  Pt reports dizziness and blurry vision for the last 7 days.  Pt denies HA/N/V speech or balance difficulties.    Pt again urged to go to Urgent Care for a prn for HTN and counseled of the threats/dangers of  a stroke from HTN.  Pt reports that she will call 911 for elevation of BP to include HA, speech or balance problem, or worsening of blurry vision and dizziness.    Will forward on to cardio team and triage for call back tomorrow.   Dr Mariah Milling - can this pt get a PRN order?

## 2023-02-13 NOTE — Patient Outreach (Signed)
Care Management   Visit Note  02/13/2023 Name: Hannah Kirby MRN: [id number] DOB: 1945/06/11  Subjective: Hannah Kirby Tippets is a 77 y.o. year old female who is a primary care patient of Fisher, Demetrios Isaacs, MD. The Care Management team was consulted for assistance.      Engaged with Mrs. Chesney via telephone.  Assessment:  Review of patient past medical history, allergies, medications, health status, including review of consultants reports, laboratory and other test data, was performed as part of  evaluation and provision of care management services.    Outpatient Encounter Medications as of 02/13/2023  Medication Sig Note   acetaminophen (TYLENOL) 500 MG tablet Take 500 mg by mouth every 6 (six) hours as needed.    albuterol (VENTOLIN HFA) 108 (90 Base) MCG/ACT inhaler Inhale 2 puffs into the lungs every 4 (four) hours as needed for wheezing or shortness of breath.    aspirin (ASPIR-81) 81 MG EC tablet Take 81 mg by mouth daily.    calcium carbonate (TUMS - DOSED IN MG ELEMENTAL CALCIUM) 500 MG chewable tablet Chew 1 tablet by mouth daily as needed.    clopidogrel (PLAVIX) 75 MG tablet Take 1 tablet (75 mg total) by mouth daily.    cyanocobalamin 1000 MCG tablet Take 1,000 mcg by mouth daily.    dapagliflozin propanediol (FARXIGA) 10 MG TABS tablet Take 10 mg by mouth daily. 01/10/2023: Contacted [facility]/Needs medication mailed   Docusate Sodium (DSS) 100 MG CAPS Take 1 capsule by mouth daily.    ezetimibe (ZETIA) 10 MG tablet Take 1 tablet (10 mg total) by mouth daily.    Fluticasone-Umeclidin-Vilant (TRELEGY ELLIPTA) 100-62.5-25 MCG/ACT AEPB Inhale 1 puff into the lungs daily.    gabapentin (NEURONTIN) 100 MG capsule Take 1 capsule by mouth at bedtime as needed. (Patient not taking: Reported on 01/24/2023)    HYDROcodone-acetaminophen (NORCO) 7.5-325 MG tablet Take 1 tablet by mouth every 6 (six) hours as needed for moderate pain (pain score 4-6).    insulin glargine (LANTUS) 100  UNIT/ML injection Inject 20 Units into the skin daily. 01/24/2023: Reports dose was increased to 22 units   lisinopril (ZESTRIL) 20 MG tablet Take 1 tablet (20 mg total) by mouth daily.    meclizine (ANTIVERT) 25 MG tablet Take 25 mg by mouth 3 (three) times daily as needed.    metoprolol tartrate (LOPRESSOR) 100 MG tablet Take 1 tablet (100 mg total) by mouth 2 (two) times daily.    Multiple Vitamins-Minerals (DAILY MULTI) TABS Take 1 tablet by mouth daily.    nitroGLYCERIN (NITROSTAT) 0.4 MG SL tablet Place 1 tablet (0.4 mg total) under the tongue every 5 (five) minutes as needed.    Omega-3 Fatty Acids (FISH OIL) 1000 MG CAPS Take 2 capsules by mouth in the morning and at bedtime.     ondansetron (ZOFRAN ODT) 4 MG disintegrating tablet Take 1 tablet (4 mg total) by mouth every 8 (eight) hours as needed for nausea or vomiting. (Patient not taking: Reported on 11/30/2022)    pregabalin (LYRICA) 25 MG capsule Take 25 mg by mouth 2 (two) times daily as needed. 01/24/2023: Reports taking as needed   RABEprazole (ACIPHEX) 20 MG tablet Take 1 tablet (20 mg total) by mouth daily. 15 Mins. before evening meal    rosuvastatin (CRESTOR) 20 MG tablet Take 1 tablet (20 mg total) by mouth daily.    Semaglutide,0.25 or 0.5MG /DOS, 2 MG/1.5ML SOPN Inject 0.25 mg into the skin once a week. 05/02/2022: Reports taking 0.5 mg  weekly   sucralfate (CARAFATE) 1 g tablet Take 1 tablet (1 g total) by mouth 2 (two) times daily. 15 Minutes before evening meal and at bed time    No facility-administered encounter medications on file as of 02/13/2023.

## 2023-02-14 ENCOUNTER — Other Ambulatory Visit: Payer: Self-pay

## 2023-02-17 ENCOUNTER — Ambulatory Visit: Payer: Self-pay

## 2023-02-17 NOTE — Telephone Encounter (Signed)
  Chief Complaint: HTN Symptoms: BP 165/94, dizziness at times Frequency: 7+ days Pertinent Negatives: Patient denies HA Disposition: [] ED /[] Urgent Care (no appt availability in office) / [x] Appointment(In office/virtual)/ []  [place] [facility]/ [] Home Care/ [] Refused Recommended Disposition /[] [place] [facility]/ []  Follow-up with PCP Additional Notes: pt checked BP while speaking with NT, still continues to take lisinopril and Metoprolol, had scheduled appt for 02/23/23, advised of sooner appt, r/s to 02/20/23 at 100 with Dr. Payton Mccallum. Advised her if she feels like she will not make it d/t having MRI at 1100 to call as soon as possible. Pt verbalized understanding.   Summary: dizziness   Patient called stated she has been having some dizziness and high blood pressure but she had not taken her pressure today so she was not sure what her numbers are. She has an appt with Caryl Asp on 1/2.Marland KitchenMarland KitchenPlease f/u with patient         Reason for Disposition  Systolic BP  >= 160 OR Diastolic >= 100  Answer Assessment - Initial Assessment Questions 1. BLOOD PRESSURE: "What is the blood pressure?" "Did you take at least two measurements 5 minutes apart?"     165/94 2. ONSET: "When did you take your blood pressure?"     7 days + 3. HOW: "How did you take your blood pressure?" (e.g., automatic home BP monitor, visiting nurse)     Home BP 4. HISTORY: "Do you have a history of high blood pressure?"     yes 5. MEDICINES: "Are you taking any medicines for blood pressure?" "Have you missed any doses recently?"     Lisinopril and metoprolol 6. OTHER SYMPTOMS: "Do you have any symptoms?" (e.g., blurred vision, chest pain, difficulty breathing, headache, weakness)     Dizziness at times  Protocols used: Blood Pressure - High-A-AH

## 2023-02-20 ENCOUNTER — Ambulatory Visit
Admission: RE | Admit: 2023-02-20 | Discharge: 2023-02-20 | Disposition: A | Discharge status: Home / Self Care | Payer: Medicare Other | Source: Ambulatory Visit | Attending: Physician Assistant | Admitting: Physician Assistant

## 2023-02-20 ENCOUNTER — Encounter: Payer: Self-pay | Admitting: Family Medicine

## 2023-02-20 ENCOUNTER — Ambulatory Visit (INDEPENDENT_AMBULATORY_CARE_PROVIDER_SITE_OTHER): Payer: Medicare Other | Admitting: Family Medicine

## 2023-02-20 VITALS — BP 130/82 | HR 71 | Resp 16 | Ht 65.0 in | Wt 144.0 lb

## 2023-02-20 DIAGNOSIS — J309 Allergic rhinitis, unspecified: Secondary | ICD-10-CM

## 2023-02-20 DIAGNOSIS — E860 Dehydration: Secondary | ICD-10-CM

## 2023-02-20 DIAGNOSIS — N1832 Chronic kidney disease, stage 3b: Secondary | ICD-10-CM | POA: Diagnosis not present

## 2023-02-20 DIAGNOSIS — E1122 Type 2 diabetes mellitus with diabetic chronic kidney disease: Secondary | ICD-10-CM | POA: Diagnosis not present

## 2023-02-20 DIAGNOSIS — Z7984 Long term (current) use of oral hypoglycemic drugs: Secondary | ICD-10-CM

## 2023-02-20 DIAGNOSIS — Z8673 Personal history of transient ischemic attack (TIA), and cerebral infarction without residual deficits: Secondary | ICD-10-CM | POA: Diagnosis not present

## 2023-02-20 DIAGNOSIS — R42 Dizziness and giddiness: Secondary | ICD-10-CM | POA: Diagnosis not present

## 2023-02-20 DIAGNOSIS — G459 Transient cerebral ischemic attack, unspecified: Secondary | ICD-10-CM | POA: Insufficient documentation

## 2023-02-20 DIAGNOSIS — I10 Essential (primary) hypertension: Secondary | ICD-10-CM | POA: Diagnosis not present

## 2023-02-20 DIAGNOSIS — Z471 Aftercare following joint replacement surgery: Secondary | ICD-10-CM | POA: Diagnosis not present

## 2023-02-20 MED ORDER — DAPAGLIFLOZIN PROPANEDIOL 5 MG PO TABS: 5.0000 mg | ORAL_TABLET | Freq: Every day | ORAL | 3 refills | Status: AC

## 2023-02-20 NOTE — Patient Outreach (Signed)
Care Management   Visit Note   Name: Hannah Kirby MRN: [id number] DOB: [birth year]  Subjective: Hannah Kirby is a 77 y.o. year old female who is a primary care patient of Fisher, Demetrios Isaacs, MD. The Care Management team was consulted for assistance.      Engaged with Mrs. Dougal via telephone.  Assessment:  Review of patient past medical history, allergies, medications, health status, including review of consultants reports, laboratory and other test data, was performed as part of  evaluation and provision of care management services.    Outpatient Encounter Medications as of 02/14/2023  Medication Sig Note   acetaminophen (TYLENOL) 500 MG tablet Take 500 mg by mouth every 6 (six) hours as needed.    albuterol (VENTOLIN HFA) 108 (90 Base) MCG/ACT inhaler Inhale 2 puffs into the lungs every 4 (four) hours as needed for wheezing or shortness of breath.    aspirin (ASPIR-81) 81 MG EC tablet Take 81 mg by mouth daily.    calcium carbonate (TUMS - DOSED IN MG ELEMENTAL CALCIUM) 500 MG chewable tablet Chew 1 tablet by mouth daily as needed.    clopidogrel (PLAVIX) 75 MG tablet Take 1 tablet (75 mg total) by mouth daily.    cyanocobalamin 1000 MCG tablet Take 1,000 mcg by mouth daily.    dapagliflozin propanediol (FARXIGA) 10 MG TABS tablet Take 10 mg by mouth daily. 01/10/2023: Contacted [facility]Needs medication mailed   Docusate Sodium (DSS) 100 MG CAPS Take 1 capsule by mouth daily.    ezetimibe (ZETIA) 10 MG tablet Take 1 tablet (10 mg total) by mouth daily.    Fluticasone-Umeclidin-Vilant (TRELEGY ELLIPTA) 100-62.5-25 MCG/ACT AEPB Inhale 1 puff into the lungs daily.    gabapentin (NEURONTIN) 100 MG capsule Take 1 capsule by mouth at bedtime as needed. (Patient not taking: Reported on 01/24/2023)    HYDROcodone-acetaminophen (NORCO) 7.5-325 MG tablet Take 1 tablet by mouth every 6 (six) hours as needed for moderate pain (pain score 4-6).    insulin glargine (LANTUS) 100 UNIT/ML  injection Inject 20 Units into the skin daily. 01/24/2023: Reports dose was increased to 22 units   lisinopril (ZESTRIL) 20 MG tablet Take 1 tablet (20 mg total) by mouth daily.    meclizine (ANTIVERT) 25 MG tablet Take 25 mg by mouth 3 (three) times daily as needed.    metoprolol tartrate (LOPRESSOR) 100 MG tablet Take 1 tablet (100 mg total) by mouth 2 (two) times daily.    Multiple Vitamins-Minerals (DAILY MULTI) TABS Take 1 tablet by mouth daily.    nitroGLYCERIN (NITROSTAT) 0.4 MG SL tablet Place 1 tablet (0.4 mg total) under the tongue every 5 (five) minutes as needed.    Omega-3 Fatty Acids (FISH OIL) 1000 MG CAPS Take 2 capsules by mouth in the morning and at bedtime.     ondansetron (ZOFRAN ODT) 4 MG disintegrating tablet Take 1 tablet (4 mg total) by mouth every 8 (eight) hours as needed for nausea or vomiting. (Patient not taking: Reported on 11/30/2022)    pregabalin (LYRICA) 25 MG capsule Take 25 mg by mouth 2 (two) times daily as needed. 01/24/2023: Reports taking as needed   RABEprazole (ACIPHEX) 20 MG tablet Take 1 tablet (20 mg total) by mouth daily. 15 Mins. before evening meal    rosuvastatin (CRESTOR) 20 MG tablet Take 1 tablet (20 mg total) by mouth daily.    Semaglutide,0.25 or 0.5MG /DOS, 2 MG/1.5ML SOPN Inject 0.25 mg into the skin once a week. 05/02/2022: Reports taking 0.5 mg  weekly   sucralfate (CARAFATE) 1 g tablet Take 1 tablet (1 g total) by mouth 2 (two) times daily. 15 Minutes before evening meal and at bed time    No facility-administered encounter medications on file as of 02/14/2023.

## 2023-02-20 NOTE — Assessment & Plan Note (Signed)
Recommend a daily loratadine (claritin) to help reduce congestion.

## 2023-02-20 NOTE — Patient Instructions (Addendum)
  Please do Not take any Farxiga for 3 days and be sure to drink plenty of water (at least four 8-oz glasses per day) during this time.  After three days (on Thursday), start Farxiga 5 mg tablets.   Recommend a daily loratadine (claritin) for your allergies to help reduce your congestion.

## 2023-02-23 ENCOUNTER — Ambulatory Visit: Payer: Medicare Other | Admitting: Family Medicine

## 2023-02-23 ENCOUNTER — Encounter: Payer: Self-pay | Admitting: Family Medicine

## 2023-02-23 DIAGNOSIS — R42 Dizziness and giddiness: Secondary | ICD-10-CM | POA: Insufficient documentation

## 2023-02-23 MED ORDER — AMLODIPINE BESYLATE 5 MG PO TABS: 5.0000 mg | ORAL_TABLET | Freq: Every day | ORAL | 0 refills | Status: DC | PRN

## 2023-02-23 NOTE — Telephone Encounter (Signed)
 Patient has been made aware and new script has been sent in. She will call back with an update as needed   Gollan, Evalene PARAS, MD  Cristopher Olivia PARAS, RN3 days ago    Most of her blood pressures typically run 120 up to 130 systolic Makes me think elevated blood pressure could be situational and may come back down on its own We can call in amlodipine  5 mg daily as needed for systolic pressure over 150 Max dose amlodipine  10 mg Thx TGolla

## 2023-02-23 NOTE — Assessment & Plan Note (Signed)
 Chronic hypertension with recent readings of 200/100 mmHg at home. Associated symptoms include dizziness and visual disturbances.  However orthostatic vital signs show systolic range of 100s to 140s.   - Given her symptoms, will not adjust her blood pressure medications today.   - Patient to follow-up with PCP in several weeks for recheck.

## 2023-02-23 NOTE — Assessment & Plan Note (Signed)
 Orthostatic hypotension noted during examination, likely contributing to dizziness. Dehydration and medication side effects (Farxiga ) considered as contributing factors. Discussed the risks of continuing Farxiga , including potential dehydration and its impact on blood pressure. Benefits of Farxiga  include improved kidney function and diabetes management. Decision made to hold Farxiga  temporarily to assess impact on symptoms. - Hold Farxiga  for three days - Encourage hydration with at least 40 ounces of water per day - Restart Farxiga  at 5 mg after three days - Follow up with primary care physician in three weeks

## 2023-02-23 NOTE — Assessment & Plan Note (Signed)
 Dizziness worsening throughout the day, associated with visual disturbances described as 'cross-eyed' vision. Symptoms have been present daily for two weeks. Orthostatic hypotension noted. Differential includes medication side effects, dehydration, and possible cardiovascular or neurological causes. Discussed the potential contribution of Farxiga  to dizziness and the importance of hydration. - Hold Farxiga  for three days - Encourage hydration with at least 40 ounces of water per day - Restart Farxiga  at 5 mg after three days - Follow up with primary care physician in three weeks

## 2023-02-23 NOTE — Assessment & Plan Note (Signed)
 Type 2 diabetes managed with Lantus , Farxiga , and Ozempic. Recent blood glucose levels not monitored regularly. Last glucose check over a week ago. No recent hypoglycemic episodes reported. Discussed the importance of regular blood glucose monitoring to manage diabetes effectively, especially during changes in medication. - Encourage regular blood glucose monitoring - Hold Farxiga  for three days - Restart Farxiga  at 5 mg after three days - Follow up with primary care physician in three weeks

## 2023-02-28 ENCOUNTER — Other Ambulatory Visit: Payer: Self-pay | Admitting: Cardiovascular Disease

## 2023-02-28 NOTE — Telephone Encounter (Signed)
 Please see note below concerning refill for Amlodipine. Medication not listed as an allergy.

## 2023-03-01 DIAGNOSIS — I129 Hypertensive chronic kidney disease with stage 1 through stage 4 chronic kidney disease, or unspecified chronic kidney disease: Secondary | ICD-10-CM | POA: Diagnosis not present

## 2023-03-01 DIAGNOSIS — N1832 Chronic kidney disease, stage 3b: Secondary | ICD-10-CM | POA: Diagnosis not present

## 2023-03-01 DIAGNOSIS — R809 Proteinuria, unspecified: Secondary | ICD-10-CM | POA: Diagnosis not present

## 2023-03-01 DIAGNOSIS — E1122 Type 2 diabetes mellitus with diabetic chronic kidney disease: Secondary | ICD-10-CM | POA: Diagnosis not present

## 2023-03-01 DIAGNOSIS — N2581 Secondary hyperparathyroidism of renal origin: Secondary | ICD-10-CM | POA: Diagnosis not present

## 2023-03-03 ENCOUNTER — Other Ambulatory Visit: Payer: Self-pay | Admitting: Family Medicine

## 2023-03-04 MED ORDER — HYDROCODONE-ACETAMINOPHEN 7.5-325 MG PO TABS: 1.0000 | ORAL_TABLET | Freq: Four times a day (QID) | ORAL | 0 refills | Status: DC | PRN

## 2023-03-13 ENCOUNTER — Ambulatory Visit: Payer: Medicare Other | Admitting: Family Medicine

## 2023-03-13 ENCOUNTER — Encounter: Payer: Self-pay | Admitting: Family Medicine

## 2023-03-13 DIAGNOSIS — E1122 Type 2 diabetes mellitus with diabetic chronic kidney disease: Secondary | ICD-10-CM

## 2023-03-13 DIAGNOSIS — Z7984 Long term (current) use of oral hypoglycemic drugs: Secondary | ICD-10-CM

## 2023-03-13 DIAGNOSIS — N1832 Chronic kidney disease, stage 3b: Secondary | ICD-10-CM | POA: Diagnosis not present

## 2023-03-13 NOTE — Progress Notes (Signed)
Established patient visit   Patient: Hannah Kirby   DOB: 1945-09-01   78 y.o. Female  MRN: [id number] Visit Date: 03/13/2023  Today's healthcare provider: Mila Merry, MD   Chief Complaint  Patient presents with   Hypertension   Diabetes   Subjective    Discussed the use of AI scribe software for clinical note transcription with the patient, who gave verbal consent to proceed.  History of Present Illness   The patient presents for follow up and blurred vision for which she Dr. Payton Mccallum on 12-30, at which time she was orthostatic. Marcelline Deist was held for 3 days, then restarted at lower dose of 5mg . She has since changed to 2.5mg  twice a day and reports symptoms have resolved. The patient denied any chest pains or heart flutters but did report some light shortness of breath, which has not changed recently. She also denied any noticeable swelling in her hands, feet, or ankles. The patient is also on Ozempic, at a dosage of 0.5 for diabetes which is managed by Dr. Gershon Crane she sees later this month. She had follow up with her nephrologist earlier this month.       Medications: Outpatient Medications Prior to Visit  Medication Sig   acetaminophen (TYLENOL) 500 MG tablet Take 500 mg by mouth every 6 (six) hours as needed.   albuterol (VENTOLIN HFA) 108 (90 Base) MCG/ACT inhaler Inhale 2 puffs into the lungs every 4 (four) hours as needed for wheezing or shortness of breath.   amLODipine (NORVASC) 5 MG tablet DO NOT FILL AMLODIPINE DUE TO PATIENT IS ALLERGIC TO MED PRESCRIBED   aspirin (ASPIR-81) 81 MG EC tablet Take 81 mg by mouth daily.   calcium carbonate (TUMS - DOSED IN MG ELEMENTAL CALCIUM) 500 MG chewable tablet Chew 1 tablet by mouth daily as needed.   clopidogrel (PLAVIX) 75 MG tablet Take 1 tablet (75 mg total) by mouth daily.   cyanocobalamin 1000 MCG tablet Take 1,000 mcg by mouth daily.   dapagliflozin propanediol (FARXIGA) 5 MG TABS tablet Take 1 tablet (5 mg total) by  mouth daily before breakfast.   Docusate Sodium (DSS) 100 MG CAPS Take 1 capsule by mouth daily.   ezetimibe (ZETIA) 10 MG tablet Take 1 tablet (10 mg total) by mouth daily.   Fluticasone-Umeclidin-Vilant (TRELEGY ELLIPTA) 100-62.5-25 MCG/ACT AEPB Inhale 1 puff into the lungs daily.   HYDROcodone-acetaminophen (NORCO) 7.5-325 MG tablet Take 1 tablet by mouth every 6 (six) hours as needed for moderate pain (pain score 4-6).   insulin glargine (LANTUS) 100 UNIT/ML injection Inject 20 Units into the skin daily.   lisinopril (ZESTRIL) 20 MG tablet Take 1 tablet (20 mg total) by mouth daily.   meclizine (ANTIVERT) 25 MG tablet Take 25 mg by mouth 3 (three) times daily as needed.   metoprolol tartrate (LOPRESSOR) 100 MG tablet Take 1 tablet (100 mg total) by mouth 2 (two) times daily.   Multiple Vitamins-Minerals (DAILY MULTI) TABS Take 1 tablet by mouth daily.   nitroGLYCERIN (NITROSTAT) 0.4 MG SL tablet Place 1 tablet (0.4 mg total) under the tongue every 5 (five) minutes as needed.   Omega-3 Fatty Acids (FISH OIL) 1000 MG CAPS Take 2 capsules by mouth in the morning and at bedtime.    ondansetron (ZOFRAN ODT) 4 MG disintegrating tablet Take 1 tablet (4 mg total) by mouth every 8 (eight) hours as needed for nausea or vomiting.   RABEprazole (ACIPHEX) 20 MG tablet Take 1 tablet (20 mg  total) by mouth daily. 15 Mins. before evening meal   rosuvastatin (CRESTOR) 20 MG tablet Take 1 tablet (20 mg total) by mouth daily.   Semaglutide,0.25 or 0.5MG /DOS, 2 MG/1.5ML SOPN Inject 0.25 mg into the skin once a week.   sucralfate (CARAFATE) 1 g tablet Take 1 tablet (1 g total) by mouth 2 (two) times daily. 15 Minutes before evening meal and at bed time   No facility-administered medications prior to visit.   Review of Systems  Constitutional:  Negative for appetite change, chills, fatigue and fever.  Respiratory:  Negative for chest tightness and shortness of breath.   Cardiovascular:  Negative for chest pain  and palpitations.  Gastrointestinal:  Negative for abdominal pain, nausea and vomiting.  Neurological:  Negative for dizziness and weakness.       Objective    BP 136/83   Pulse 68   Temp 97.7 F (36.5 C) (Oral)   Ht 5\' 5"  (1.651 m)   Wt 143 lb 12.8 oz (65.2 kg)   BMI 23.93 kg/m   Physical Exam   General: Appearance:    Well developed, well nourished female in no acute distress  Eyes:    PERRL, conjunctiva/corneas clear, EOM's intact       Lungs:     Clear to auscultation bilaterally, respirations unlabored  Heart:    Normal heart rate. Normal rhythm. No murmurs, rubs, or gallops.    MS:   All extremities are intact.    Neurologic:   Awake, alert, oriented x 3. No apparent focal neurological defect.            Assessment & Plan        Dizziness Resolved after reducing Farxiga dosage. No other symptoms reported. -Continue Farxiga at reduced dosage (0.5mg  daily, split into two doses).  Diabetes with nephropathy Managed with [place] and Ozempic. -Continue current regimen of Farxiga and Ozempic (0.5mg ). Follow up nephrology and endocrinology as scheduled.     Return in about 6 months (around 09/10/2023).      Mila Merry, MD  [facility] [phone] (phone) [phone] (fax)  [facility]

## 2023-03-13 NOTE — Patient Instructions (Signed)
.   Please review the attached list of medications and notify my office if there are any errors.   . Please bring all of your medications to every appointment so we can make sure that our medication list is the same as yours.   

## 2023-03-17 ENCOUNTER — Emergency Department: Payer: Medicare Other

## 2023-03-17 ENCOUNTER — Observation Stay
Admission: EM | Admit: 2023-03-17 | Discharge: 2023-03-18 | Disposition: A | Discharge status: Home / Self Care | Payer: Medicare Other | Attending: Internal Medicine | Admitting: Internal Medicine

## 2023-03-17 ENCOUNTER — Telehealth: Payer: Self-pay | Admitting: Cardiovascular Disease

## 2023-03-17 ENCOUNTER — Other Ambulatory Visit: Payer: Self-pay

## 2023-03-17 ENCOUNTER — Observation Stay: Payer: Medicare Other

## 2023-03-17 DIAGNOSIS — I5032 Chronic diastolic (congestive) heart failure: Secondary | ICD-10-CM | POA: Insufficient documentation

## 2023-03-17 DIAGNOSIS — Z8673 Personal history of transient ischemic attack (TIA), and cerebral infarction without residual deficits: Secondary | ICD-10-CM | POA: Diagnosis not present

## 2023-03-17 DIAGNOSIS — R0602 Shortness of breath: Secondary | ICD-10-CM | POA: Diagnosis not present

## 2023-03-17 DIAGNOSIS — Z794 Long term (current) use of insulin: Secondary | ICD-10-CM | POA: Insufficient documentation

## 2023-03-17 DIAGNOSIS — Z79899 Other long term (current) drug therapy: Secondary | ICD-10-CM | POA: Insufficient documentation

## 2023-03-17 DIAGNOSIS — R4781 Slurred speech: Secondary | ICD-10-CM | POA: Diagnosis not present

## 2023-03-17 DIAGNOSIS — Z471 Aftercare following joint replacement surgery: Secondary | ICD-10-CM | POA: Diagnosis not present

## 2023-03-17 DIAGNOSIS — I130 Hypertensive heart and chronic kidney disease with heart failure and stage 1 through stage 4 chronic kidney disease, or unspecified chronic kidney disease: Secondary | ICD-10-CM | POA: Insufficient documentation

## 2023-03-17 DIAGNOSIS — I69322 Dysarthria following cerebral infarction: Secondary | ICD-10-CM | POA: Diagnosis present

## 2023-03-17 DIAGNOSIS — R471 Dysarthria and anarthria: Secondary | ICD-10-CM | POA: Diagnosis not present

## 2023-03-17 DIAGNOSIS — E785 Hyperlipidemia, unspecified: Secondary | ICD-10-CM | POA: Diagnosis not present

## 2023-03-17 DIAGNOSIS — I639 Cerebral infarction, unspecified: Principal | ICD-10-CM

## 2023-03-17 DIAGNOSIS — J441 Chronic obstructive pulmonary disease with (acute) exacerbation: Secondary | ICD-10-CM | POA: Diagnosis not present

## 2023-03-17 DIAGNOSIS — E1122 Type 2 diabetes mellitus with diabetic chronic kidney disease: Secondary | ICD-10-CM | POA: Insufficient documentation

## 2023-03-17 DIAGNOSIS — I10 Essential (primary) hypertension: Secondary | ICD-10-CM | POA: Insufficient documentation

## 2023-03-17 DIAGNOSIS — J449 Chronic obstructive pulmonary disease, unspecified: Secondary | ICD-10-CM | POA: Diagnosis not present

## 2023-03-17 DIAGNOSIS — N1832 Chronic kidney disease, stage 3b: Secondary | ICD-10-CM | POA: Diagnosis not present

## 2023-03-17 DIAGNOSIS — D649 Anemia, unspecified: Secondary | ICD-10-CM | POA: Diagnosis not present

## 2023-03-17 DIAGNOSIS — F17210 Nicotine dependence, cigarettes, uncomplicated: Secondary | ICD-10-CM | POA: Insufficient documentation

## 2023-03-17 DIAGNOSIS — I6782 Cerebral ischemia: Secondary | ICD-10-CM | POA: Diagnosis not present

## 2023-03-17 DIAGNOSIS — R29818 Other symptoms and signs involving the nervous system: Secondary | ICD-10-CM | POA: Diagnosis not present

## 2023-03-17 DIAGNOSIS — R4789 Other speech disturbances: Principal | ICD-10-CM

## 2023-03-17 LAB — DIFFERENTIAL
Abs Immature Granulocytes: 0.02 10*3/uL (ref 0.00–0.07)
Basophils Absolute: 0 10*3/uL (ref 0.0–0.1)
Basophils Relative: 1 %
Eosinophils Absolute: 0.1 10*3/uL (ref 0.0–0.5)
Eosinophils Relative: 2 %
Immature Granulocytes: 0 %
Lymphocytes Relative: 33 %
Lymphs Abs: 2.5 10*3/uL (ref 0.7–4.0)
Monocytes Absolute: 0.7 10*3/uL (ref 0.1–1.0)
Monocytes Relative: 9 %
Neutro Abs: 4.2 10*3/uL (ref 1.7–7.7)
Neutrophils Relative %: 55 %

## 2023-03-17 LAB — RESP PANEL BY RT-PCR (RSV, FLU A&B, COVID)  RVPGX2
Influenza A by PCR: NEGATIVE
Influenza B by PCR: NEGATIVE
Resp Syncytial Virus by PCR: NEGATIVE
SARS Coronavirus 2 by RT PCR: NEGATIVE

## 2023-03-17 LAB — PROTIME-INR
INR: 1 (ref 0.8–1.2)
Prothrombin Time: 13.6 s (ref 11.4–15.2)

## 2023-03-17 LAB — CBC
HCT: 52.7 % — ABNORMAL HIGH (ref 36.0–46.0)
Hemoglobin: 17.7 g/dL — ABNORMAL HIGH (ref 12.0–15.0)
MCH: 32.2 pg (ref 26.0–34.0)
MCHC: 33.6 g/dL (ref 30.0–36.0)
MCV: 96 fL (ref 80.0–100.0)
Platelets: 201 10*3/uL (ref 150–400)
RBC: 5.49 MIL/uL — ABNORMAL HIGH (ref 3.87–5.11)
RDW: 13.1 % (ref 11.5–15.5)
WBC: 7.6 10*3/uL (ref 4.0–10.5)
nRBC: 0 % (ref 0.0–0.2)

## 2023-03-17 LAB — HEMOGLOBIN A1C
Hgb A1c MFr Bld: 7.1 % — ABNORMAL HIGH (ref 4.8–5.6)
Mean Plasma Glucose: 157.07 mg/dL

## 2023-03-17 LAB — CBG MONITORING, ED
Glucose-Capillary: 156 mg/dL — ABNORMAL HIGH (ref 70–99)
Glucose-Capillary: 111 mg/dL — ABNORMAL HIGH (ref 70–99)
Glucose-Capillary: 168 mg/dL — ABNORMAL HIGH (ref 70–99)

## 2023-03-17 LAB — COMPREHENSIVE METABOLIC PANEL
ALT: 19 U/L (ref 0–44)
AST: 25 U/L (ref 15–41)
Albumin: 3.4 g/dL — ABNORMAL LOW (ref 3.5–5.0)
Alkaline Phosphatase: 78 U/L (ref 38–126)
Anion gap: 12 (ref 5–15)
BUN: 17 mg/dL (ref 8–23)
CO2: 25 mmol/L (ref 22–32)
Calcium: 9.7 mg/dL (ref 8.9–10.3)
Chloride: 105 mmol/L (ref 98–111)
Creatinine, Ser: 1.41 mg/dL — ABNORMAL HIGH (ref 0.44–1.00)
GFR, Estimated: 39 mL/min — ABNORMAL LOW (ref >60)
Glucose, Bld: 112 mg/dL — ABNORMAL HIGH (ref 70–99)
Potassium: 3.7 mmol/L (ref 3.5–5.1)
Sodium: 142 mmol/L (ref 135–145)
Total Bilirubin: 0.7 mg/dL (ref 0.0–1.2)
Total Protein: 7.1 g/dL (ref 6.5–8.1)

## 2023-03-17 LAB — ETHANOL: Alcohol, Ethyl (B): <10 mg/dL (ref <10)

## 2023-03-17 LAB — APTT: aPTT: 30 s (ref 24–36)

## 2023-03-17 LAB — TROPONIN I (HIGH SENSITIVITY)
Troponin I (High Sensitivity): 13 ng/L (ref <18)
Troponin I (High Sensitivity): 15 ng/L (ref <18)

## 2023-03-17 MED ORDER — ACETAMINOPHEN 325 MG PO TABS: 650.0000 mg | ORAL_TABLET | Freq: Four times a day (QID) | ORAL | Status: DC | PRN

## 2023-03-17 MED ORDER — ONDANSETRON HCL 4 MG/2ML IJ SOLN: 4.0000 mg | Freq: Four times a day (QID) | INTRAMUSCULAR | Status: DC | PRN

## 2023-03-17 MED ORDER — EZETIMIBE 10 MG PO TABS
10.0000 mg | ORAL_TABLET | Freq: Every day | ORAL | Status: DC
  Administered 2023-03-18: 10 mg via ORAL
  Filled 2023-03-17: qty 1

## 2023-03-17 MED ORDER — ROSUVASTATIN CALCIUM 20 MG PO TABS
20.0000 mg | ORAL_TABLET | Freq: Every day | ORAL | Status: DC
  Administered 2023-03-18: 20 mg via ORAL
  Filled 2023-03-17: qty 1

## 2023-03-17 MED ORDER — ALPRAZOLAM 0.25 MG PO TABS
0.2500 mg | ORAL_TABLET | Freq: Every evening | ORAL | Status: DC | PRN
  Administered 2023-03-17: 0.25 mg via ORAL
  Filled 2023-03-17: qty 1

## 2023-03-17 MED ORDER — METOPROLOL TARTRATE 50 MG PO TABS
100.0000 mg | ORAL_TABLET | Freq: Two times a day (BID) | ORAL | Status: DC
Start: 2023-03-17 — End: 2023-03-18
  Administered 2023-03-18: 100 mg via ORAL
  Filled 2023-03-17: qty 2

## 2023-03-17 MED ORDER — INSULIN ASPART 100 UNIT/ML IJ SOLN
0.0000 [IU] | Freq: Three times a day (TID) | INTRAMUSCULAR | Status: DC
  Administered 2023-03-18: 2 [IU] via SUBCUTANEOUS
  Filled 2023-03-17: qty 1

## 2023-03-17 MED ORDER — ADULT MULTIVITAMIN W/MINERALS CH
1.0000 | ORAL_TABLET | Freq: Every day | ORAL | Status: DC
Start: 2023-03-18 — End: 2023-03-18
  Administered 2023-03-18: 1 via ORAL
  Filled 2023-03-17: qty 1

## 2023-03-17 MED ORDER — PANTOPRAZOLE SODIUM 40 MG PO TBEC
40.0000 mg | DELAYED_RELEASE_TABLET | Freq: Every day | ORAL | Status: DC
  Administered 2023-03-18: 40 mg via ORAL
  Filled 2023-03-17: qty 1

## 2023-03-17 MED ORDER — ONDANSETRON HCL 4 MG PO TABS: 4.0000 mg | ORAL_TABLET | Freq: Four times a day (QID) | ORAL | Status: DC | PRN

## 2023-03-17 MED ORDER — INSULIN ASPART 100 UNIT/ML IJ SOLN: 0.0000 [IU] | Freq: Every day | INTRAMUSCULAR | Status: DC

## 2023-03-17 MED ORDER — ENOXAPARIN SODIUM 40 MG/0.4ML IJ SOSY
40.0000 mg | PREFILLED_SYRINGE | INTRAMUSCULAR | Status: DC
  Administered 2023-03-17: 40 mg via SUBCUTANEOUS
  Filled 2023-03-17: qty 0.4

## 2023-03-17 MED ORDER — ACETAMINOPHEN 325 MG RE SUPP: 650.0000 mg | Freq: Four times a day (QID) | RECTAL | Status: DC | PRN

## 2023-03-17 MED ORDER — INSULIN GLARGINE-YFGN 100 UNIT/ML ~~LOC~~ SOLN
22.0000 [IU] | Freq: Every day | SUBCUTANEOUS | Status: DC
  Administered 2023-03-18: 22 [IU] via SUBCUTANEOUS
  Filled 2023-03-17: qty 0.22

## 2023-03-17 MED ORDER — IPRATROPIUM-ALBUTEROL 0.5-2.5 (3) MG/3ML IN SOLN
3.0000 mL | Freq: Once | RESPIRATORY_TRACT | Status: AC
  Administered 2023-03-17: 3 mL via RESPIRATORY_TRACT
  Filled 2023-03-17: qty 3

## 2023-03-17 MED ORDER — DAPAGLIFLOZIN PROPANEDIOL 5 MG PO TABS
5.0000 mg | ORAL_TABLET | Freq: Every day | ORAL | Status: DC
  Administered 2023-03-18: 5 mg via ORAL
  Filled 2023-03-17: qty 1

## 2023-03-17 MED ORDER — CYANOCOBALAMIN 500 MCG PO TABS
1000.0000 ug | ORAL_TABLET | Freq: Every day | ORAL | Status: DC
  Administered 2023-03-18: 1000 ug via ORAL
  Filled 2023-03-17: qty 2

## 2023-03-17 MED ORDER — SENNOSIDES-DOCUSATE SODIUM 8.6-50 MG PO TABS: 1.0000 | ORAL_TABLET | Freq: Every evening | ORAL | Status: DC | PRN

## 2023-03-17 MED ORDER — LISINOPRIL 10 MG PO TABS
20.0000 mg | ORAL_TABLET | Freq: Every day | ORAL | Status: DC
  Administered 2023-03-18: 20 mg via ORAL
  Filled 2023-03-17: qty 2

## 2023-03-17 MED ORDER — ASPIRIN 81 MG PO CHEW
81.0000 mg | CHEWABLE_TABLET | Freq: Once | ORAL | Status: AC
  Administered 2023-03-17: 81 mg via ORAL
  Filled 2023-03-17: qty 1

## 2023-03-17 MED ORDER — DOCUSATE SODIUM 100 MG PO CAPS
100.0000 mg | ORAL_CAPSULE | Freq: Two times a day (BID) | ORAL | Status: DC
  Administered 2023-03-17 – 2023-03-18 (×2): 100 mg via ORAL
  Filled 2023-03-17 (×2): qty 1

## 2023-03-17 MED ORDER — CALCIUM CARBONATE ANTACID 500 MG PO CHEW: 1.0000 | CHEWABLE_TABLET | Freq: Every day | ORAL | Status: DC | PRN

## 2023-03-17 MED ORDER — CLOPIDOGREL BISULFATE 75 MG PO TABS
75.0000 mg | ORAL_TABLET | Freq: Every day | ORAL | Status: DC
  Administered 2023-03-18: 75 mg via ORAL
  Filled 2023-03-17: qty 1

## 2023-03-17 NOTE — ED Notes (Signed)
EKG showed STEMI and repeated twice. MD Pad shown and not a STEMI

## 2023-03-17 NOTE — ED Provider Triage Note (Signed)
Emergency Medicine Provider Triage Evaluation Note  Hannah Kirby , a 78 y.o. female  was evaluated in triage.  Pt complains of slurred speech. Symptoms may have began 2 days ago. Patient still having slurred speech. Family does not think there was a period of time where she returned to normal.  Review of Systems  Positive: Slurred speech, difficulty walking  Negative:   Physical Exam  There were no vitals taken for this visit. Gen:   Awake, no distress, oriented to place, speech still slurred Resp:  Normal effort  MSK:   Moves extremities without difficulty  Other:  No facial droop, no weakness  Medical Decision Making  Medically screening exam initiated at 1:35 PM.  Appropriate orders placed.  Jamonica Schoff Vondrasek was informed that the remainder of the evaluation will be completed by another provider, this initial triage assessment does not replace that evaluation, and the importance of remaining in the ED until their evaluation is complete.     Cameron Ali, PA-C 03/17/23 1342

## 2023-03-17 NOTE — Telephone Encounter (Signed)
Received call from patient. She was slow to speak on the phone, some mix up with words- which is new for her per patient. Patient states she had this starting yesterday and it has not improved. She called her neurologist but they were not able to see her today- I advised patient that she should be evaluated by calling 911 for assessment. Patient has history of stroke, tia. Patient verbalized understanding.

## 2023-03-17 NOTE — ED Notes (Signed)
Patient going to MRI at this time.

## 2023-03-17 NOTE — ED Notes (Signed)
MD at bedside.

## 2023-03-17 NOTE — ED Notes (Signed)
Pt taken to ED 5h at this time.

## 2023-03-17 NOTE — H&P (Signed)
History and Physical    Patient: Hannah Kirby WUJ:[id number] DOB: November 14, 1945 DOA: 03/17/2023 DOS: the patient was seen and examined on 03/17/2023 PCP: Malva Limes, MD  Patient coming from: Home  Chief Complaint:  Chief Complaint  Patient presents with   Aphasia   HPI: [name] is a 78 y.o. female with medical history significant of   hypertension, chronic kidney disease stage IIIB, type II diabetes on insulin, CAD, chronic smoking, chronic respiratory failure with COPD and on home oxygen, anxiety, hyperlipidemia comes to the emergency room when patient noticed word finding and dysarthria for last three days. Most of the history is obtained from patient's daughter and son in the ER patient is tearful and upset does not want to stay in the hospital. Patient denies any focal weakness. Has some blurred vision however earlier violation was able to read and see distant vision is clear. She seems to be overall feeling weak. Denies any recent fall. Has had falls on the past.  In the ER patient's oxygen saturation one 90-93% on room air. Per patient she does not wear her oxygen continuously at home. CT of the head does not show any stroke or bleed. ER physician canceled code stroke. MRI is pending.  Family request patient admitted for rule out stroke. MRI on 20 February 2023 negative for acute stroke. Patient has history of chronic liquid or strokes in the past  CT head was negative for hemorrhage and discussed with your physician to order aspirin. Patient is being admitted for further evaluation management    Review of Systems: As mentioned in the history of present illness. All other systems reviewed and are negative. Past Medical History:  Diagnosis Date   Acute hemorrhagic colitis 08/08/2019   Anemia    Anxiety    Arthritis    C. difficile colitis 08/09/2019   CAD (coronary artery disease)    a. 02/2006 PCI: BMS x 2 to RCA, cath o/w without significant coronary  disease; b. nuclear stress test 07/2014: No ischemia/infarct; c. 11/2017 MV: no isch/infarct, EF 55-65%; d. 03/2020 NSTEMI/PCI: LM nl, LAD min irregs, RI 25, small, LCX nl, RCA 30p/m ISR, 99d (3.0x15 Resolute Onyx DES).   Chronic bronchitis (HCC)    secondary to cigarette smoking   Chronic kidney disease (CKD), stage III (moderate) (HCC)    COPD (chronic obstructive pulmonary disease) (HCC)    Diabetes mellitus    Diastolic dysfunction    a. 07/2019 Echo: EF 60-65%, no rwma, mod LVH. Nl RV size/fxn; b. 03/2020 Echo: EF 60-65%, mod LVH, gr1 DD, nl RV size/fxn, mild AS.   FHx: allergies    GERD (gastroesophageal reflux disease)    Goiter    Granulomatous disease (HCC)    Hernia    Hyperlipidemia    Hypertension    Kidney stone on left side 2013   Microalbuminuria    NSTEMI (non-ST elevated myocardial infarction) (HCC) 03/23/2020   Obesity    Panic attacks    PVC's (premature ventricular contractions)    a. 03/2018 Zio: Occas PVCs (2.5%). Triggered events assoc w/ PVC/PAC.   Retinal tear 2020   Smokers' cough (HCC)    Spinal stenosis    Stroke (HCC) 10/29/2016   mild left side weakness   Stroke (HCC) 08/01/2019   Tobacco abuse    Past Surgical History:  Procedure Laterality Date   ABDOMINAL HYSTERECTOMY     BREAST SURGERY     CATARACT EXTRACTION W/PHACO Right 03/01/2017   Procedure: CATARACT EXTRACTION  PHACO AND INTRAOCULAR LENS PLACEMENT (IOC) RIGHT DIABETIC;  Surgeon: Lockie Mola, MD;  Location: [facility];  Service: Ophthalmology;  Laterality: Right;   CATARACT EXTRACTION W/PHACO Left 03/22/2017   Procedure: CATARACT EXTRACTION PHACO AND INTRAOCULAR LENS PLACEMENT (IOC) LEFT DIABETIC;  Surgeon: Lockie Mola, MD;  Location: [facility];  Service: Ophthalmology;  Laterality: Left;  Diabetic - insulin and oral meds   COLONOSCOPY WITH PROPOFOL N/A 09/23/2014   Procedure: COLONOSCOPY WITH PROPOFOL;  Surgeon: Christena Deem, MD;  Location: [facility] ENDOSCOPY;   Service: Endoscopy;  Laterality: N/A;   COLONOSCOPY WITH PROPOFOL N/A 01/11/2018   Procedure: COLONOSCOPY WITH PROPOFOL;  Surgeon: Christena Deem, MD;  Location: [facility] ENDOSCOPY;  Service: Endoscopy;  Laterality: N/A;   COLONOSCOPY WITH PROPOFOL N/A 04/24/2018   Procedure: COLONOSCOPY WITH PROPOFOL;  Surgeon: Christena Deem, MD;  Location: [facility] ENDOSCOPY;  Service: Endoscopy;  Laterality: N/A;   COLONOSCOPY WITH PROPOFOL N/A 11/19/2019   Procedure: COLONOSCOPY WITH PROPOFOL;  Surgeon: Midge Minium, MD;  Location: [facility] END[facility]COPY;  Service: Endoscopy;  Laterality: N/A;   CORONARY ANGIOPLASTY WITH STENT PLACEMENT  2008   CORONARY STENT INTERVENTION N/A 03/24/2020   Procedure: CORONARY STENT INTERVENTION;  Surgeon: Yvonne Kendall, MD;  Location: [facility] INVASIVE CV LAB;  Service: Cardiovascular;  Laterality: N/A;   ESOPHAGOGASTRODUODENOSCOPY (EGD) WITH PROPOFOL N/A 12/30/2014   Procedure: ESOPHAGOGASTRODUODENOSCOPY (EGD) WITH PROPOFOL;  Surgeon: Christena Deem, MD;  Location[facility] END[facility];  Service: Endoscopy;  Laterality: N/A;   ESOPHAGOGASTRODUODENOSCOPY (EGD) WITH PROPOFOL N/A 07/19/2016   Procedure: ESOPHAGOGASTRODUODENOSCOPY (EGD) WITH PROPOFOL;  Surgeon: Christena Deem, MD;  Location: [facility] ENDOSCOPY;  Service: Endoscopy;  Laterality: N/A;   ESOPHAGOGASTRODUODENOSCOPY (EGD) WITH PROPOFOL N/A 04/24/2018   Procedure: ESOPHAGOGASTRODUODENOSCOPY (EGD) WITH PROPOFOL;  Surgeon: Christena Deem, MD;  Location: [facility] ENDOSCOPY;  Service: Endoscopy;  Laterality: N/A;   hysterectomy (other)     LEFT HEART CATH AND CORONARY ANGIOGRAPHY N/A 03/24/2020   Procedure: LEFT HEART CATH AND CORONARY ANGIOGRAPHY;  Surgeon: Yvonne Kendall, MD;  Location: [facility] INVASIVE CV LAB;  Service: Cardiovascular;  Laterality: N/A;   Social History:  reports that she has been smoking cigarettes. She has a 40 pack-year smoking history. She has never used smokeless tobacco. She reports current alcohol use. She reports that  she does not use drugs.  Allergies  Allergen Reactions   Spironolactone Shortness Of Breath   Sulfa Antibiotics Other (See Comments) and Rash    Mouth blisters  Welts on mouth   Sulfonamide Derivatives Other (See Comments)    Last taken as a child; made her mouth break out   Other Other (See Comments)    Mouth blisters   Codeine Nausea And Vomiting and Other (See Comments)    Nausea   Prednisone Palpitations and Other (See Comments)    Made her legs "feel weird."    Family History  Problem Relation Age of Onset   Heart failure Mother    Stroke Mother    Diabetes Mother    Congestive Heart Failure Mother    Lung cancer Father    Breast cancer Maternal Aunt     Prior to Admission medications   Medication Sig Start Date End Date Taking? Authorizing Provider  acetaminophen (TYLENOL) 500 MG tablet Take 500 mg by mouth every 6 (six) hours as needed.    [provider]  albuterol (VENTOLIN HFA) 108 (90 Base) MCG/ACT inhaler Inhale 2 puffs into the lungs every 4 (four) hours as needed for wheezing or shortness of  breath. 04/08/22   Malva Limes, MD  amLODipine (NORVASC) 5 MG tablet DO NOT FILL AMLODIPINE DUE TO PATIENT IS ALLERGIC TO MED PRESCRIBED 03/01/23   Antonieta Iba, MD  aspirin (ASPIR-81) 81 MG EC tablet Take 81 mg by mouth daily.    [provider]  calcium carbonate (TUMS - DOSED IN MG ELEMENTAL CALCIUM) 500 MG chewable tablet Chew 1 tablet by mouth daily as needed.    [provider]  clopidogrel (PLAVIX) 75 MG tablet Take 1 tablet (75 mg total) by mouth daily. 08/16/22   Antonieta Iba, MD  cyanocobalamin 1000 MCG tablet Take 1,000 mcg by mouth daily.    [provider]  dapagliflozin propanediol (FARXIGA) 5 MG TABS tablet Take 1 tablet (5 mg total) by mouth daily before breakfast. 02/20/23   Pardue, Monico Blitz, DO  Docusate Sodium (DSS) 100 MG CAPS Take 1 capsule by mouth daily.    [provider]  ezetimibe (ZETIA) 10 MG  tablet Take 1 tablet (10 mg total) by mouth daily. 08/16/22   Antonieta Iba, MD  Fluticasone-Umeclidin-Vilant (TRELEGY ELLIPTA) 100-62.5-25 MCG/ACT AEPB Inhale 1 puff into the lungs daily. 04/28/22   Malva Limes, MD  HYDROcodone-acetaminophen (NORCO) 7.5-325 MG tablet Take 1 tablet by mouth every 6 (six) hours as needed for moderate pain (pain score 4-6). 03/04/23   Malva Limes, MD  insulin glargine (LANTUS) 100 UNIT/ML injection Inject 20 Units into the skin daily.    [provider]  lisinopril (ZESTRIL) 20 MG tablet Take 1 tablet (20 mg total) by mouth daily. 08/16/22   Antonieta Iba, MD  meclizine (ANTIVERT) 25 MG tablet Take 25 mg by mouth 3 (three) times daily as needed. 02/04/22   [provider]  metoprolol tartrate (LOPRESSOR) 100 MG tablet Take 1 tablet (100 mg total) by mouth 2 (two) times daily. 08/16/22   Antonieta Iba, MD  Multiple Vitamins-Minerals (DAILY MULTI) TABS Take 1 tablet by mouth daily.    [provider]  nitroGLYCERIN (NITROSTAT) 0.4 MG SL tablet Place 1 tablet (0.4 mg total) under the tongue every 5 (five) minutes as needed. 08/16/22   Antonieta Iba, MD  Omega-3 Fatty Acids (FISH OIL) 1000 MG CAPS Take 2 capsules by mouth in the morning and at bedtime.     [provider]  ondansetron (ZOFRAN ODT) 4 MG disintegrating tablet Take 1 tablet (4 mg total) by mouth every 8 (eight) hours as needed for nausea or vomiting. 08/06/16   Don Perking, [place], MD  RABEprazole (ACIPHEX) 20 MG tablet Take 1 tablet (20 mg total) by mouth daily. 15 Mins. before evening meal 11/08/22   Midge Minium, MD  rosuvastatin (CRESTOR) 20 MG tablet Take 1 tablet (20 mg total) by mouth daily. 08/16/22   Antonieta Iba, MD  Semaglutide,0.25 or 0.5MG /DOS, 2 MG/1.5ML SOPN Inject 0.25 mg into the skin once a week. 01/13/20   [provider]  sucralfate (CARAFATE) 1 g tablet Take 1 tablet (1 g total) by mouth 2 (two) times daily. 15 Minutes before  evening meal and at bed time 11/08/22   Midge Minium, MD    Physical Exam: Vitals:   03/17/23 1339 03/17/23 1340 03/17/23 1500  BP:  (!) 142/93 (!) 160/94  Pulse:  68 65  Resp:  18 (!) 25  Temp:  98 F (36.7 C)   SpO2:  90% 97%  Weight: 65.2 kg    Height: 5\' 5"  (1.651 m)  Physical Exam Constitutional:      Appearance: Normal appearance.  HENT:     Head: Normocephalic and atraumatic.  Eyes:     Extraocular Movements: Extraocular movements intact.     Pupils: Pupils are equal, round, and reactive to light.  Cardiovascular:     Rate and Rhythm: Normal rate and regular rhythm.  Pulmonary:     Effort: Pulmonary effort is normal.     Breath sounds: Normal breath sounds.  Musculoskeletal:        General: Normal range of motion.     Cervical back: Normal range of motion.  Neurological:     General: No focal deficit present.     Mental Status: She is alert and oriented to person, place, and time.  Psychiatric:     Comments: Anxious, tearful    Assessment and Plan: BIRDENA KINGMA is a 78 y.o. female with medical history significant of   hypertension, chronic kidney disease stage IIIB, type II diabetes on insulin, CAD, chronic smoking, chronic respiratory failure with COPD and on home oxygen, anxiety, hyperlipidemia comes to the emergency room when patient noticed word finding and dysarthria for last three days.  Dysarthria generalized weakness history of stroke in the past -- continue aspirin/Plavix and statins -- PT, OT, SLP to see in the morning -- MRI of the brain  Hypertension -- resume home meds  Type II diabetes, insulin requiring, CKD stage IIIB -- sliding scale insulin and resume long-acting insulin  Hyperlipidemia -- statins  Anxiety -- patient currently not on any PO meds. Will give PRN Xanax for now -- discussed with patient's daughter and son to address anxiety issues with PCP for patient to be started on something outpatient and be  monitored  COPD/chronic respiratory failure on chronic home oxygen chronic tobacco abuse -- patient encouraged to use oxygen at home -- currently stable continue nebs and inhalers -- advised smoking cessation     Advance Care Planning:   Code Status: Full Code   Family Communication: son and dter  Severity of Illness: The appropriate patient status for this patient is OBSERVATION. Observation status is judged to be reasonable and necessary in order to provide the required intensity of service to ensure the patient's safety. The patient's presenting symptoms, physical exam findings, and initial radiographic and laboratory data in the context of their medical condition is felt to place them at decreased risk for further clinical deterioration. Furthermore, it is anticipated that the patient will be medically stable for discharge from the hospital within 2 midnights of admission.   Author: Enedina Finner, MD 03/17/2023 4:40 PM  For on call review [email or web address].

## 2023-03-17 NOTE — Telephone Encounter (Signed)
Patient stated she has been having issues with speaking and has trouble getting out what she is saying. Patient stated it started a few days.

## 2023-03-17 NOTE — ED Provider Notes (Addendum)
[facility] Provider Note    Event Date/Time   First MD Initiated Contact with Patient 03/17/23 1401     (approximate)   History   Aphasia   HPI  Hannah Kirby is a 78 y.o. female who comes in with slurred speech for the past 2 days.  Also with concern for word finding difficulties.  There is also concerns that they are not walking the same and stumbling.  This been ongoing for the past 2 days. There has been no time in the past 48 hours where they have been asymptomatic.  She reports to me note really weakness on 1 side but more of just difficulties with finding words.  She does have a history of COPD that she has oxygen that she uses as needed at home but has not really been using it.  She does report of little bit of shortness of breath as well.  No chest pain no abdominal pain.   Physical Exam   Triage Vital Signs: ED Triage Vitals  Encounter Vitals Group     BP 03/17/23 1340 (!) 142/93     Systolic BP Percentile --      Diastolic BP Percentile --      Pulse Rate 03/17/23 1340 68     Resp 03/17/23 1340 18     Temp 03/17/23 1340 98 F (36.7 C)     Temp src --      SpO2 03/17/23 1340 90 %     Weight 03/17/23 1339 143 lb 12.8 oz (65.2 kg)     Height 03/17/23 1339 5\' 5"  (1.651 m)     Head Circumference --      Peak Flow --      Pain Score 03/17/23 1339 0     Pain Loc --      Pain Education --      Exclude from Growth Chart --     Most recent vital signs: Vitals:   03/17/23 1340  BP: (!) 142/93  Pulse: 68  Resp: 18  Temp: 98 F (36.7 C)  SpO2: 90%     General: Awake, no distress.  CV:  Good peripheral perfusion.  Resp:  Normal effort.  Abd:  No distention.  Other:  Patient has some word finding difficulties but otherwise cranial nerves are intact, equal strength in arms and legs.  Sensation intact.   ED Results / Procedures / Treatments   Labs (all labs ordered are listed, but only abnormal results are displayed) Labs  Reviewed  CBG MONITORING, ED - Abnormal; Notable for the following components:      Result Value   Glucose-Capillary 156 (*)    All other components within normal limits  ETHANOL  PROTIME-INR  APTT  CBC  DIFFERENTIAL  COMPREHENSIVE METABOLIC PANEL  URINE DRUG SCREEN, QUALITATIVE ([facility] ONLY)  URINALYSIS, ROUTINE W REFLEX MICROSCOPIC     EKG  My interpretation of EKG:  Normal sinus rate of 71 without any significant ST elevation maybe a little bit in lead III and aVF, normal intervals.  Being read as a STEMI but this was evaluated by Dr. Demetrius Charity agreed that it was not a STEMI  RADIOLOGY  I reviewed the CT head personally interpreted no acute hemorrhage   PROCEDURES:  Critical Care performed: No  .1-3 Lead EKG Interpretation  Performed by: Concha Se, MD Authorized by: Concha Se, MD     Interpretation: normal     ECG rate:  60  ECG rate assessment: normal     Rhythm: sinus rhythm     Ectopy: none     Conduction: normal      MEDICATIONS ORDERED IN ED: Medications - No data to display   IMPRESSION / MDM / ASSESSMENT AND PLAN / ED COURSE  I reviewed the triage vital signs and the nursing notes.   Patient's presentation is most consistent with acute presentation with potential threat to life or bodily function.   Comes in with slurred speech for the past 2 days.  This concerning for potential of stroke.  CT head ordered evaluate for intracranial hemorrhage.  Stroke code not called from triage due to it being out of the window for interventions given over 24 hours.  Blood work was ordered evaluate for [employer] abnormalities, AKI.  She does report a little bit of shortness of breath EKG was being read as STEMI but upon my interpretation of the did not actually look like a STEMI.  She has no chest pain.  I will get troponins.  She was 90% on room air and has some wheezing noted bilaterally from her COPD.  Will treat her as a COPD exacerbation.  Patient be handed off  pending CT, blood work and further disposition  Initial labs re-assuring CMP pending. [name] concerned she has been unstable at [facility] difficutly with walking.  Will d/w hospital admission stroke workup MRI- and monitor breathing given report COPD flare and pt/ot to [facility] sure safe for discharge home.   Hospitalist requesting I put in MRI and aspirin order-  MRI pending.   The patient is on the cardiac monitor to evaluate for evidence of arrhythmia and/or significant heart rate changes.      FINAL CLINICAL IMPRESSION(S) / ED DIAGNOSES   Final diagnoses:  Word finding difficulty  Chronic obstructive pulmonary disease, unspecified COPD type (HCC)     Rx / DC Orders   ED Discharge Orders     None        Note:  This document was prepared using Dragon voice recognition software and may include unintentional dictation errors.   Concha Se, MD 03/17/23 1422    Concha Se, MD 03/17/23 1531    Concha Se, MD 03/17/23 1543    Concha Se, MD 03/17/23 [phone]

## 2023-03-17 NOTE — ED Triage Notes (Signed)
Pt comes with c/o slurred speech that started maybe two days ago. Pt still having slurred speech and trouble with words. Family reports pt not walking same and stumbling some. Pt denies any dizziness or blurry vision.   Pt denies any numbness or tingling.

## 2023-03-18 ENCOUNTER — Observation Stay: Payer: Medicare Other

## 2023-03-18 DIAGNOSIS — I639 Cerebral infarction, unspecified: Secondary | ICD-10-CM | POA: Diagnosis not present

## 2023-03-18 DIAGNOSIS — E785 Hyperlipidemia, unspecified: Secondary | ICD-10-CM | POA: Diagnosis not present

## 2023-03-18 DIAGNOSIS — J449 Chronic obstructive pulmonary disease, unspecified: Secondary | ICD-10-CM | POA: Diagnosis not present

## 2023-03-18 DIAGNOSIS — R29818 Other symptoms and signs involving the nervous system: Secondary | ICD-10-CM | POA: Diagnosis not present

## 2023-03-18 DIAGNOSIS — I6523 Occlusion and stenosis of bilateral carotid arteries: Secondary | ICD-10-CM | POA: Diagnosis not present

## 2023-03-18 DIAGNOSIS — E119 Type 2 diabetes mellitus without complications: Secondary | ICD-10-CM | POA: Diagnosis not present

## 2023-03-18 DIAGNOSIS — I672 Cerebral atherosclerosis: Secondary | ICD-10-CM | POA: Diagnosis not present

## 2023-03-18 DIAGNOSIS — I10 Essential (primary) hypertension: Secondary | ICD-10-CM | POA: Diagnosis not present

## 2023-03-18 DIAGNOSIS — I6602 Occlusion and stenosis of left middle cerebral artery: Secondary | ICD-10-CM | POA: Diagnosis not present

## 2023-03-18 DIAGNOSIS — R471 Dysarthria and anarthria: Secondary | ICD-10-CM | POA: Diagnosis not present

## 2023-03-18 DIAGNOSIS — I771 Stricture of artery: Secondary | ICD-10-CM | POA: Diagnosis not present

## 2023-03-18 HISTORY — DX: Cerebral infarction, unspecified: I63.9

## 2023-03-18 LAB — URINE DRUG SCREEN, QUALITATIVE (ARMC ONLY)
Amphetamines, Ur Screen: NOT DETECTED
Barbiturates, Ur Screen: NOT DETECTED
Benzodiazepine, Ur Scrn: POSITIVE — AB
Cannabinoid 50 Ng, Ur ~~LOC~~: NOT DETECTED
Cocaine Metabolite,Ur ~~LOC~~: NOT DETECTED
MDMA (Ecstasy)Ur Screen: NOT DETECTED
Methadone Scn, Ur: NOT DETECTED
Opiate, Ur Screen: NOT DETECTED
Phencyclidine (PCP) Ur S: NOT DETECTED
Tricyclic, Ur Screen: NOT DETECTED

## 2023-03-18 LAB — URINALYSIS, ROUTINE W REFLEX MICROSCOPIC
Bacteria, UA: NONE SEEN
Bilirubin Urine: NEGATIVE
Glucose, UA: >=500 mg/dL — AB
Hgb urine dipstick: NEGATIVE
Ketones, ur: NEGATIVE mg/dL
Nitrite: NEGATIVE
Protein, ur: 100 mg/dL — AB
Specific Gravity, Urine: 1.017 (ref 1.005–1.030)
pH: 5 (ref 5.0–8.0)

## 2023-03-18 LAB — CBG MONITORING, ED
Glucose-Capillary: 115 mg/dL — ABNORMAL HIGH (ref 70–99)
Glucose-Capillary: 122 mg/dL — ABNORMAL HIGH (ref 70–99)

## 2023-03-18 MED ORDER — MIRTAZAPINE 7.5 MG PO TABS: 7.5000 mg | ORAL_TABLET | Freq: Every day | ORAL | 0 refills | Status: DC

## 2023-03-18 MED ORDER — MIRTAZAPINE 15 MG PO TABS: 7.5000 mg | ORAL_TABLET | Freq: Every day | ORAL | Status: DC

## 2023-03-18 NOTE — Evaluation (Signed)
Physical Therapy Evaluation Patient Details Name: Hannah Kirby MRN: [id number] DOB: 29-Apr-1945 Today's Date: 03/18/2023  History of Present Illness  78 y.o. female with PMHx significant of    HTN, CKD stage IIIB, DMT2 on insulin, CAD, chronic smoking, chronic respiratory failure with COPD and on home oxygen, anxiety, HLD comes to the emergency room when patient noticed word finding and dysarthria for last three days.  Clinical Impression  Pt eager to get up and show that she can go home, ultimately did well with this walking confidently and safety with walker and then single HHA.  She was slower and more guarded but did not have unsteadiness without the AD.  Pt's biggest issue remains her speech which remained labored and delayed.  Pt will benefit from continued PT to address functional limitations and insure safe transition back to living at home.      If plan is discharge home, recommend the following: Assist for transportation;Assistance with cooking/housework;Help with stairs or ramp for entrance   Can travel by private vehicle        Equipment Recommendations None recommended by PT  Recommendations for Other Services       Functional Status Assessment Patient has had a recent decline in their functional status and demonstrates the ability to make significant improvements in function in a reasonable and predictable amount of time.     Precautions / Restrictions Precautions Precautions: Fall Restrictions Weight Bearing Restrictions Per Provider Order: No      Mobility  Bed Mobility Overal bed mobility: Modified Independent Bed Mobility: Supine to Sit, Sit to Supine     Supine to sit: Supervision Sit to supine: Supervision        Transfers Overall transfer level: Modified independent Equipment used: Rolling walker (2 wheels) Transfers: Sit to/from Stand Sit to Stand: Supervision           General transfer comment: Able to safely rise with good confidence     Ambulation/Gait Ambulation/Gait assistance: Modified independent (Device/Increase time) Gait Distance (Feet): 100 Feet Assistive device: Rolling walker (2 wheels), 1 person hand held assist         General Gait Details: Pt was able to confidently walk with appropriate speed using walker, slower but still safe with single HHA  Stairs            Wheelchair Mobility     Tilt Bed    Modified Rankin (Stroke Patients Only)       Balance Overall balance assessment: Needs assistance Sitting-balance support: Feet supported, No upper extremity supported Sitting balance-Leahy Scale: Good     Standing balance support: Single extremity supported Standing balance-Leahy Scale: Fair                               Pertinent Vitals/Pain Pain Assessment Pain Assessment: No/denies pain    Home Living Family/patient expects to be discharged to:: Private residence Living Arrangements: Spouse/significant other Available Help at Discharge: Family;Available PRN/intermittently Type of Home: House Home Access: Stairs to enter Entrance Stairs-Rails: None Entrance Stairs-Number of Steps: 1   Home Layout: One level Home Equipment: Cane - single point;Rollator (4 wheels)      Prior Function Prior Level of Function : Driving;History of Falls (last six months);Independent/Modified Independent             Mobility Comments: pt denies AD use, endorses falls ADLs Comments: Pt reports indep with ADL and IADL, pt/family report she is supposed  to wear 2L O2 24/7 but doesn't     Extremity/Trunk Assessment   Upper Extremity Assessment Upper Extremity Assessment: Overall WFL for tasks assessed    Lower Extremity Assessment Lower Extremity Assessment: Overall WFL for tasks assessed    Cervical / Trunk Assessment Cervical / Trunk Assessment: Normal  Communication   Communication Communication: Difficulty communicating thoughts/reduced clarity of speech  Cognition  Arousal: Alert Behavior During Therapy: WFL for tasks assessed/performed Overall Cognitive Status: Impaired/Different from baseline Area of Impairment: Safety/judgement, Problem solving                         Safety/Judgement: Decreased awareness of deficits, Decreased awareness of safety   Problem Solving: Slow processing          General Comments General comments (skin integrity, edema, etc.): [place] off, SpO2 in low 80's on RA. With 2L reapplied, back up to mid 90's, low 90's with exertion    Exercises     Assessment/Plan    PT Assessment Patient needs continued PT services  PT Problem List Decreased strength;Decreased activity tolerance;Decreased balance;Decreased range of motion;Decreased mobility;Decreased safety awareness;Decreased knowledge of use of DME       PT Treatment Interventions DME instruction;Gait training;Stair training;Functional mobility training;Therapeutic activities;Therapeutic exercise;Balance training;Patient/family education    PT Goals (Current goals can be found in the Care Plan section)  Acute Rehab PT Goals Patient Stated Goal: go home ASAP PT Goal Formulation: With patient/family Time For Goal Achievement: 03/31/23 Potential to Achieve Goals: Good    Frequency Min 1X/week     Co-evaluation               AM-PAC PT "6 Clicks" Mobility  Outcome Measure Help needed turning from your back to your side while in a flat bed without using bedrails?: None Help needed moving from lying on your back to sitting on the side of a flat bed without using bedrails?: None Help needed moving to and from a bed to a chair (including a wheelchair)?: None Help needed standing up from a chair using your arms (e.g., wheelchair or bedside chair)?: None Help needed to walk in hospital room?: A Little Help needed climbing 3-5 steps with a railing? : A Little 6 Click Score: 22    End of Session Equipment Utilized During Treatment: Gait belt Activity  Tolerance: Patient tolerated treatment well Patient left: in bed;with family/visitor present Nurse Communication: Mobility status PT Visit Diagnosis: Muscle weakness (generalized) (M62.81);Difficulty in walking, not elsewhere classified (R26.2);Unsteadiness on feet (R26.81)    Time: 4098-1191 PT Time Calculation (min) (ACUTE ONLY): 14 min   Charges:   PT Evaluation $PT Eval Low Complexity: 1 Low   PT General Charges $$ ACUTE PT VISIT: 1 Visit         Malachi Pro, DPT 03/18/2023, 1:47 PM

## 2023-03-18 NOTE — Discharge Summary (Signed)
Physician Discharge Summary   Patient: Hannah Kirby MRN: [id number] DOB: [birth year]  Admit date:     03/17/2023  Discharge date: 03/18/23  Discharge Physician: Enedina Finner   PCP: Malva Limes, MD   Recommendations at discharge:   follow-up Dr. Sherrie Mustache in 1 to 2 week follow-up cardiology Dr. Mariah Milling and get echocardiogram to complete stroke workup follow-up Dr. Malvin Johns neurology acute CVA  Discharge Diagnoses: Principal Problem:   Dysarthria Active Problems:   Hyperlipidemia   Primary hypertension   Chronic obstructive pulmonary disease (HCC)  Hannah Kirby is a 78 y.o. female with medical history significant of   hypertension, chronic kidney disease stage IIIB, type II diabetes on insulin, CAD, chronic smoking, chronic respiratory failure with COPD and on home oxygen, anxiety, hyperlipidemia comes to the emergency room when patient noticed word finding and dysarthria for last three days.   Acute CVA , lacuna infarct left posterior frontal lobe and left coronary data -- patient came in with two day history of dysarthria and generalized weakness she has history of stroke in the past -- continue aspirin/Plavix and statins -- PT, OT, SLP input appreciated -- MRI of the brain--reveals 2 punctate infarcts in the left corona radiata/subcortical white matter in the left posterior frontal lobe  --MR angio of the head and neck reveals strong suspicion for severe stenosis of the distal left M1 segment which is new from 2018 as well as severe stenosis or occlusion of the right A2/A3 segment which is new since 2018.  --US carotid doppler Mild amount of calcified plaque at the level of both carotid bulbs and proximal internal carotid arteries. Estimated bilateral ICA stenosis is less than 50%. -- Patient to get echo of the heart with Dr. Mariah Milling as outpatient- seen by neurology. Recommend continue aspirin Plavix and statin. -Follow-up neurology as outpatient   Hypertension -- resume  home meds   Type II diabetes, insulin requiring, CKD stage IIIB -- sliding scale insulin and resume long-acting insulin   Hyperlipidemia -- statins   Anxiety -- patient currently not on any PO meds. Will give PRN Xanax for now -- discussed with patient's daughter and son to address anxiety issues with PCP for patient to be started on something outpatient and be monitored -- I have started patient on remeron to help her with sleep. Patient will follow-up with PCP and defer to PCP regarding management of insomnia and anxiety   COPD/chronic respiratory failure on chronic home oxygen chronic tobacco abuse -- patient encouraged to use oxygen at home -- currently stable continue nebs and inhalers -- advised smoking cessation   patient had been eager to go home since yesterday. She was evaluated by PT OT and speech. Discharge plan discussed with patient's daughter.      Advance Care Planning:   Code Status: Full Code       Consultants: neuro- Procedures performed: none Disposition: Home health Diet recommendation:  Discharge Diet Orders (From admission, onward)     Start     Ordered   03/18/23 0000  Diet - low sodium heart healthy        03/18/23 1222           Cardiac and Carb modified diet DISCHARGE MEDICATION: Allergies as of 03/18/2023       Reactions   Spironolactone Shortness Of Breath   Sulfa Antibiotics Other (See Comments), Rash   Mouth blisters Welts on mouth   Sulfonamide Derivatives Other (See Comments)   Last taken as a child;  made her mouth break out   Other Other (See Comments)   Mouth blisters   Codeine Nausea And Vomiting, Other (See Comments)   Nausea   Prednisone Palpitations, Other (See Comments)   Made her legs "feel weird."        Medication List     STOP taking these medications    amLODipine 5 MG tablet Commonly known as: NORVASC       TAKE these medications    acetaminophen 500 MG tablet Commonly known as: TYLENOL Take 500  mg by mouth every 6 (six) hours as needed.   albuterol 108 (90 Base) MCG/ACT inhaler Commonly known as: Ventolin HFA Inhale 2 puffs into the lungs every 4 (four) hours as needed for wheezing or shortness of breath.   Aspirin 81 81 MG tablet Generic drug: aspirin EC Take 81 mg by mouth daily.   calcium carbonate 500 MG chewable tablet Commonly known as: TUMS - dosed in mg elemental calcium Chew 1 tablet by mouth daily as needed.   clopidogrel 75 MG tablet Commonly known as: Plavix Take 1 tablet (75 mg total) by mouth daily.   cyanocobalamin 1000 MCG tablet Take 1,000 mcg by mouth daily.   Daily Multi Tabs Take 1 tablet by mouth daily.   dapagliflozin propanediol 5 MG Tabs tablet Commonly known as: Farxiga Take 1 tablet (5 mg total) by mouth daily before breakfast. What changed:  how much to take when to take this Notes to patient: Take 2.5 mg bid (split into 1/2) like before   DSS 100 MG Caps Take 1 capsule by mouth daily.   ezetimibe 10 MG tablet Commonly known as: ZETIA Take 1 tablet (10 mg total) by mouth daily.   Fish Oil 1000 MG Caps Take 2 capsules by mouth in the morning and at bedtime.   HYDROcodone-acetaminophen 7.5-325 MG tablet Commonly known as: NORCO Take 1 tablet by mouth every 6 (six) hours as needed for moderate pain (pain score 4-6).   insulin glargine 100 UNIT/ML injection Commonly known as: LANTUS Inject 22 Units into the skin daily.   lisinopril 20 MG tablet Commonly known as: ZESTRIL Take 1 tablet (20 mg total) by mouth daily.   meclizine 25 MG tablet Commonly known as: ANTIVERT Take 25 mg by mouth 3 (three) times daily as needed.   metoprolol tartrate 100 MG tablet Commonly known as: LOPRESSOR Take 1 tablet (100 mg total) by mouth 2 (two) times daily.   mirtazapine 7.5 MG tablet Commonly known as: REMERON Take 1 tablet (7.5 mg total) by mouth at bedtime.   nitroGLYCERIN 0.4 MG SL tablet Commonly known as: Nitrostat Place 1  tablet (0.4 mg total) under the tongue every 5 (five) minutes as needed.   ondansetron 4 MG disintegrating tablet Commonly known as: Zofran ODT Take 1 tablet (4 mg total) by mouth every 8 (eight) hours as needed for nausea or vomiting.   RABEprazole 20 MG tablet Commonly known as: ACIPHEX Take 1 tablet (20 mg total) by mouth daily. 15 Mins. before evening meal   rosuvastatin 20 MG tablet Commonly known as: CRESTOR Take 1 tablet (20 mg total) by mouth daily.   Semaglutide(0.25 or 0.5MG /DOS) 2 MG/1.5ML Sopn Inject 0.5 mg into the skin once a week.   sucralfate 1 g tablet Commonly known as: CARAFATE Take 1 tablet (1 g total) by mouth 2 (two) times daily. 15 Minutes before evening meal and at bed time   Trelegy Ellipta 100-62.5-25 MCG/ACT Aepb Generic drug: Fluticasone-Umeclidin-Vilant Inhale 1  puff into the lungs daily.        Follow-up Information     Gollan, Tollie Pizza, MD. Schedule an appointment as soon as possible for a visit in 1 week(s).   Specialty: Cardiology Why: echo fro CVA eval Contact information: [address] [place] [address] [phone]         Malva Limes, MD. Schedule an appointment as soon as possible for a visit in 1 week(s).   Specialty: Family Medicine Contact information: [address] [place] [address] [phone]         Morene Crocker, MD. Schedule an appointment as soon as possible for a visit.   Specialty: Neurology Why: acute CVA f/u Contact information: [address] [facility] [place] [address] [phone]                Discharge Exam: Ceasar Mons Weights   03/17/23 1339  Weight: 65.2 kg  alert and oriented times three. Mild dysarthria. Anxious. Cardiovascular both heart sounds normal no murmur. Respiratory clear to auscultation.   Condition at discharge: fair  The results of significant diagnostics from this hospitalization (including  imaging, microbiology, ancillary and laboratory) are listed below for reference.   Imaging Studies: MR ANGIO HEAD WO CONTRAST Result Date: 03/18/2023 CLINICAL DATA:  78 year old female with neurologic deficit, slurred speech. Brain MRI yesterday with punctate restricted diffusion in the left hemisphere. EXAM: MRA HEAD WITHOUT CONTRAST TECHNIQUE: Angiographic images of the Circle of Willis were acquired using MRA technique without intravenous contrast. COMPARISON:  Brain MRI yesterday.  Intracranial MRA 10/30/2016. FINDINGS: Anterior circulation: Antegrade flow in both ICA siphons. Mildly tortuous bilateral ICA siphons appears stable since 2018 with no significant stenosis. Patent carotid termini. Normal MCA and ACA origins. Ophthalmic artery origins appear stable, negative. Diminutive or absent anterior communicating artery. Left ACA branches appears stable and within normal limits. There is new severe stenosis versus occlusion of the right ACA distal A2 or A3 segment on series 1026, image 2. Right MCA M1 and right MCA branches appear stable and within normal limits. Left MCA M1 segment appears stable and patent to the anterior temporal artery origin. But the distal left A1 appears highly irregular and attenuated compared to 2018. However, the bifurcation there appears to remain patent. No left MCA branch occlusion is identified but the proximal in 2 0 attenuated. Considering the asymmetry with the opposite side this does not appear to be MOTSA artifact. Posterior circulation: Antegrade flow in the posterior circulation. Distal vertebral arteries appear stable since 2018, patent without stenosis. Right vertebral mildly dominant. Both PICA origins are patent. Patent vertebrobasilar junction and basilar artery with no significant stenosis. SCA and PCA origins remain patent. Posterior communicating arteries are diminutive or absent. Bilateral PCA branches are stable and within normal limits. Anatomic variants: None  significant. Other: Numerous foci of susceptibility artifact in keeping with the microhemorrhages seen yesterday. No intracranial mass effect or ventriculomegaly. IMPRESSION: 1. 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 Left MCA bifurcation appears patent but stenotic. No discrete branch occlusion when compared to 2018 MRA. 2. Positive also for Severe stenosis or occlusion of right ACA in the distal A2/A3 segment, also new since 2018. 3. Stable MRA elsewhere, no other intracranial artery stenosis identified. Study discussed by telephone with Dr. Milon Dikes on 03/18/2023 at 10:56 . Electronically Signed   By: Odessa Fleming M.D.   On: 03/18/2023 10:57   US Carotid  Bilateral Result Date: 03/18/2023 CLINICAL DATA:  Acute cerebral infarction, hypertension, hyperlipidemia and diabetes. EXAM: BILATERAL CAROTID DUPLEX ULTRASOUND TECHNIQUE: Wallace Cullens scale imaging, color Doppler and duplex ultrasound were performed of bilateral carotid and vertebral arteries in the neck. COMPARISON:  None Available. FINDINGS: Criteria: Quantification of carotid stenosis is based on velocity parameters that correlate the residual internal carotid diameter with NASCET-based stenosis levels, using the diameter of the distal internal carotid lumen as the denominator for stenosis measurement. The following velocity measurements were obtained: RIGHT ICA:  57/16 cm/sec CCA:  56/12 cm/sec SYSTOLIC ICA/CCA RATIO:  1.0 ECA:  81 cm/sec LEFT ICA:  89/20 cm/sec CCA:  43/9 cm/sec SYSTOLIC ICA/CCA RATIO:  2.0 ECA:  70 cm/sec RIGHT CAROTID ARTERY: Mild amount of calcified plaque at the level of the right carotid bulb and proximal right ICA. Estimated right ICA stenosis is less than 50%. RIGHT VERTEBRAL ARTERY: Antegrade flow with normal waveform and velocity. LEFT CAROTID ARTERY: Mild amount of calcified plaque at the level of the distal bulb and proximal left ICA. Estimated left ICA stenosis is less than 50%. LEFT VERTEBRAL ARTERY:  Antegrade flow with normal waveform and velocity. IMPRESSION: Mild amount of calcified plaque at the level of both carotid bulbs and proximal internal carotid arteries. Estimated bilateral ICA stenosis is less than 50%. Electronically Signed   By: Irish Lack M.D.   On: 03/18/2023 10:33   MR BRAIN WO CONTRAST Result Date: 03/17/2023 CLINICAL DATA:  Slurred speech, word-finding difficulties EXAM: MRI HEAD WITHOUT CONTRAST TECHNIQUE: Multiplanar, multiecho pulse sequences of the brain and surrounding structures were obtained without intravenous contrast. COMPARISON:  02/20/2023 FINDINGS: Brain: Punctate foci of restricted diffusion with ADC correlates in the left posterior frontal white matter (series 5, image 27 and 33), most likely acute infarcts. No acute hemorrhage, mass, mass effect, or midline shift. No hydrocephalus or extra-axial collection. Pituitary and craniocervical junction within normal limits. Numerous foci of hemosiderin deposition, most prominently in the basal ganglia, thalami, pons, and cerebellum, but also in the bilateral cerebral hemispheres. One of these foci, in the right corona radiata (series 10, image 40) is new compared to 02/20/2023. Confluent T2 hyperintense signal in the periventricular white matter, likely the sequela of moderate to severe chronic small vessel ischemic disease. Numerous remote lacunar infarcts in the bilateral basal ganglia, thalami, and pons. Vascular: Normal arterial flow voids. Skull and upper cervical spine: Normal marrow signal. Sinuses/Orbits: Clear paranasal sinuses. Status post bilateral lens replacements. Other: The mastoid air cells are well aerated. IMPRESSION: 1. Punctate foci of restricted diffusion in the left posterior frontal white matter, most likely acute infarcts. 2. Numerous foci of hemosiderin deposition, which are seen in the deep gray structures but also in the bilateral cerebral hemispheres, with 1 new focus compared to 02/20/2023. While  this could be the sequela of chronic hypertensive microhemorrhages, this appearance is concerning for cerebral amyloid angiopathy. These results will be called to the ordering clinician or representative by the Radiologist Assistant, and communication documented in the PACS or [employer]. Electronically Signed   By: Wiliam Ke M.D.   On: 03/17/2023 16:56   DG Chest 2 View Result Date: 03/17/2023 CLINICAL DATA:  Shortness of breath. EXAM: CHEST - 2 VIEW COMPARISON:  03/23/2020. FINDINGS: Bilateral lung fields are clear. Bilateral costophrenic angles are clear. Normal cardio-mediastinal silhouette. No acute osseous abnormalities. The soft tissues are within normal limits. IMPRESSION: No active cardiopulmonary disease. Electronically Signed   By: Jules Schick M.D.   On: 03/17/2023 15:19  CT HEAD WO CONTRAST Result Date: 03/17/2023 CLINICAL DATA:  Neuro deficit, acute, stroke suspected. Slurred speech for 2 days. EXAM: CT HEAD WITHOUT CONTRAST TECHNIQUE: Contiguous axial images were obtained from the base of the skull through the vertex without intravenous contrast. RADIATION DOSE REDUCTION: This exam was performed according to the departmental dose-optimization program which includes automated exposure control, adjustment of the mA and/or kV according to patient size and/or use of iterative reconstruction technique. COMPARISON:  Head MRI 02/20/2023 FINDINGS: Brain: There is no evidence of an acute infarct, intracranial hemorrhage, mass, midline shift, or extra-axial fluid collection. Patchy and confluent hypodensities in the cerebral white matter nonspecific but compatible with severe chronic small vessel ischemic disease. Chronic lacunar infarcts are again noted in the deep cerebral white matter and deep gray nuclei bilaterally. There is mild cerebral atrophy. A small chronic left pontine infarct is also again noted. Vascular: Calcified atherosclerosis at the skull base. No hyperdense vessel. Skull:  No acute fracture or suspicious osseous lesion. Sinuses/Orbits: Visualized paranasal sinuses and mastoid air cells are clear. Included portions of the orbits are nonacute. Other: None. IMPRESSION: 1. No evidence of acute intracranial abnormality. 2. Severe chronic small vessel ischemic disease. Electronically Signed   By: Sebastian Ache M.D.   On: 03/17/2023 14:14   MR BRAIN WO CONTRAST Result Date: 03/03/2023 CLINICAL DATA:  TIA EXAM: MRI HEAD WITHOUT CONTRAST TECHNIQUE: Multiplanar, multiecho pulse sequences of the brain and surrounding structures were obtained without intravenous contrast. COMPARISON:  12/16/2019 FINDINGS: Brain: No restricted diffusion to suggest acute or subacute infarct. No acute hemorrhage, mass, mass effect, or midline shift. No hydrocephalus or extra-axial collection. Pituitary and craniocervical junction within normal limits. Numerous foci of hemosiderin deposition, most prominently basal ganglia, thalami, pons, and cerebellum, but also in the bilateral cerebral hemispheres, many of which are new compared to the 2021 exam. Numerous remote lacunar infarcts in the bilateral basal ganglia, thalami, and pons. Confluent T2 hyperintense signal in the periventricular white matter and pons, likely the sequela of moderate to severe chronic small vessel ischemic disease. Vascular: Normal arterial flow voids. Skull and upper cervical spine: Normal marrow signal. Sinuses/Orbits: Clear paranasal sinuses. No acute finding in the orbits. Status post bilateral lens replacements. Other: The mastoid air cells are well aerated. IMPRESSION: 1. No acute intracranial process. No evidence of acute or subacute infarct. 2. Numerous foci of hemosiderin deposition, both centrally and peripherally, many of which are new from the prior exam. These may represent the sequela of chronic hypertensive microhemorrhages, although cerebral amyloid angiopathy can appear similar. Electronically Signed   By: Wiliam Ke M.D.    On: 03/03/2023 19:13    Microbiology: Results for orders placed or performed during the hospital encounter of 03/17/23  Resp panel by RT-PCR (RSV, Flu A&B, Covid) Anterior Nasal Swab     Status: None   Collection Time: 03/17/23  3:09 PM   Specimen: Anterior Nasal Swab  Result Value Ref Range Status   SARS Coronavirus 2 by RT PCR NEGATIVE NEGATIVE Final    Comment: (NOTE) SARS-CoV-2 target nucleic acids are NOT DETECTED.  The SARS-CoV-2 RNA is generally detectable in upper respiratory specimens during the acute phase of infection. The lowest concentration of SARS-CoV-2 viral copies this assay can detect is 138 copies/mL. A negative result does not preclude SARS-Cov-2 infection and should not be used as the sole basis for treatment or other patient management decisions. A negative result may occur with  improper specimen collection/handling, submission of specimen other than nasopharyngeal  swab, presence of viral mutation(s) within the areas targeted by this assay, and inadequate number of viral copies(<138 copies/mL). A negative result must be combined with clinical observations, patient history, and epidemiological information. The expected result is Negative.  Fact Sheet for Patients:  BloggerCourse.com  Fact Sheet for Healthcare Providers:  SeriousBroker.it  This test is no t yet approved or cleared by the [place] FDA and  has been authorized for detection and/or diagnosis of SARS-CoV-2 by FDA under an Emergency Use Authorization (EUA). This EUA will remain  in effect (meaning this test can be used) for the duration of the COVID-19 declaration under Section 564(b)(1) of the Act, 21 U.S.C.section 360bbb-3(b)(1), unless the authorization is terminated  or revoked sooner.       Influenza A by PCR NEGATIVE NEGATIVE Final   Influenza B by PCR NEGATIVE NEGATIVE Final    Comment: (NOTE) The Xpert Xpress SARS-CoV-2/FLU/RSV  plus assay is intended as an aid in the diagnosis of influenza from Nasopharyngeal swab specimens and should not be used as a sole basis for treatment. Nasal washings and aspirates are unacceptable for Xpert Xpress SARS-CoV-2/FLU/RSV testing.  Fact Sheet for Patients: BloggerCourse.com  Fact Sheet for Healthcare Providers: SeriousBroker.it  This test is not yet approved or cleared by the [place] FDA and has been authorized for detection and/or diagnosis of SARS-CoV-2 by FDA under an Emergency Use Authorization (EUA). This EUA will remain in effect (meaning this test can be used) for the duration of the COVID-19 declaration under Section 564(b)(1) of the Act, 21 U.S.C. section 360bbb-3(b)(1), unless the authorization is terminated or revoked.     Resp Syncytial Virus by PCR NEGATIVE NEGATIVE Final    Comment: (NOTE) Fact Sheet for Patients: BloggerCourse.com  Fact Sheet for Healthcare Providers: SeriousBroker.it  This test is not yet approved or cleared by the [place] FDA and has been authorized for detection and/or diagnosis of SARS-CoV-2 by FDA under an Emergency Use Authorization (EUA). This EUA will remain in effect (meaning this test can be used) for the duration of the COVID-19 declaration under Section 564(b)(1) of the Act, 21 U.S.C. section 360bbb-3(b)(1), unless the authorization is terminated or revoked.  Performed at [facility], [address] [place], [place] [address]     Labs: CBC: Recent Labs  Lab 03/17/23 1421  WBC 7.6  NEUTROABS 4.2  HGB 17.7*  HCT 52.7*  MCV 96.0  PLT 201   Basic Metabolic Panel: Recent Labs  Lab 03/17/23 1421  NA 142  K 3.7  CL 105  CO2 25  GLUCOSE 112*  BUN 17  CREATININE 1.41*  CALCIUM 9.7   Liver Function Tests: Recent Labs  Lab 03/17/23 1421  AST 25  ALT 19  ALKPHOS 78  BILITOT 0.7   PROT 7.1  ALBUMIN 3.4*   CBG: Recent Labs  Lab 03/17/23 1346 03/17/23 1651 03/17/23 2241 03/18/23 0734 03/18/23 1204  GLUCAP 156* 111* 168* 115* 122*    Discharge time spent: greater than 30 minutes.  Signed: Enedina Finner, MD [facility] 03/18/2023

## 2023-03-18 NOTE — ED Notes (Signed)
Pt taken to US/MRI.

## 2023-03-18 NOTE — Consult Note (Signed)
NEUROLOGY CONSULT NOTE   Date of service: March 18, 2023 Patient Name: Hannah Kirby MRN:  [id number] DOB:  1945-07-19 Chief Complaint: "Word finding difficulty, slurred speech" Requesting Provider: Enedina Finner, MD  History of Present Illness  Hannah Kirby is a 78 y.o. female with hx of anxiety, coronary artery disease, COPD, diabetes, diastolic CHF, hyperlipidemia, hypertension, panic disorder, prior stroke with no residual deficits presented to the emergency department for evaluation of word finding difficulty.  Family noticed that since Wednesday, 03/15/2023 she has been having some slurred speech and some difficulty with her words.  She is using wrong words and has word substitutions.  She was admitted to the hospitalist and an MRI was done-which revealed 2 punctate areas of restricted diffusion in the left posterior frontal white matter acute/subacute infarct.  Numerous foci of hemosiderin deposition-likely chronic hypertension. Neurology was consulted for recommendations for the stroke.  Patient initially was insistent that she does not need to be seen by neurology and wanted to go home but eventually I was able to see her at the request of the hospitalist Dr. Allena Katz.  LKW: Sometime on 03/15/2023 Modified rankin score: 2-Slight disability-UNABLE to perform all activities but does not need assistance IV Thrombolysis: No-outside the window EVT: No-outside the window  NIHSS components Score: Comment  1a Level of Conscious 0[x]  1[]  2[]  3[]      1b LOC Questions 0[x]  1[]  2[]       1c LOC Commands 0[x]  1[]  2[]       2 Best Gaze 0[x]  1[]  2[]       3 Visual 0[x]  1[]  2[]  3[]      4 Facial Palsy 0[x]  1[]  2[]  3[]      5a Motor Arm - left 0[x]  1[]  2[]  3[]  4[]  UN[]    5b Motor Arm - Right 0[x]  1[]  2[]  3[]  4[]  UN[]    6a Motor Leg - Left 0[x]  1[]  2[]  3[]  4[]  UN[]    6b Motor Leg - Right 0[x]  1[]  2[]  3[]  4[]  UN[]    7 Limb Ataxia 0[x]  1[]  2[]  3[]  UN[]     8 Sensory 0[x]  1[]  2[]  UN[]      9 Best  Language 0[]  1[x]  2[]  3[]      10 Dysarthria 0[]  1[x]  2[]  UN[]      11 Extinct. and Inattention 0[x]  1[]  2[]       TOTAL: 2      ROS  Comprehensive ROS performed and pertinent positives documented in the HPI  Past History   Past Medical History:  Diagnosis Date   Acute hemorrhagic colitis 08/08/2019   Anemia    Anxiety    Arthritis    C. difficile colitis 08/09/2019   CAD (coronary artery disease)    a. 02/2006 PCI: BMS x 2 to RCA, cath o/w without significant coronary disease; b. nuclear stress test 07/2014: No ischemia/infarct; c. 11/2017 MV: no isch/infarct, EF 55-65%; d. 03/2020 NSTEMI/PCI: LM nl, LAD min irregs, RI 25, small, LCX nl, RCA 30p/m ISR, 99d (3.0x15 Resolute Onyx DES).   Chronic bronchitis (HCC)    secondary to cigarette smoking   Chronic kidney disease (CKD), stage III (moderate) (HCC)    COPD (chronic obstructive pulmonary disease) (HCC)    Diabetes mellitus    Diastolic dysfunction    a. 07/2019 Echo: EF 60-65%, no rwma, mod LVH. Nl RV size/fxn; b. 03/2020 Echo: EF 60-65%, mod LVH, gr1 DD, nl RV size/fxn, mild AS.   FHx: allergies    GERD (gastroesophageal reflux disease)    Goiter    Granulomatous disease (HCC)  Hernia    Hyperlipidemia    Hypertension    Kidney stone on left side 2013   Microalbuminuria    NSTEMI (non-ST elevated myocardial infarction) (HCC) 03/23/2020   Obesity    Panic attacks    PVC's (premature ventricular contractions)    a. 03/2018 Zio: Occas PVCs (2.5%). Triggered events assoc w/ PVC/PAC.   Retinal tear 2020   Smokers' cough (HCC)    Spinal stenosis    Stroke (HCC) 10/29/2016   mild left side weakness   Stroke (HCC) 08/01/2019   Tobacco abuse     Past Surgical History:  Procedure Laterality Date   ABDOMINAL HYSTERECTOMY     BREAST SURGERY     CATARACT EXTRACTION W/PHACO Right 03/01/2017   Procedure: CATARACT EXTRACTION PHACO AND INTRAOCULAR LENS PLACEMENT (IOC) RIGHT DIABETIC;  Surgeon: Lockie Mola, MD;  Location:  [facility];  Service: Ophthalmology;  Laterality: Right;   CATARACT EXTRACTION W/PHACO Left 03/22/2017   Procedure: CATARACT EXTRACTION PHACO AND INTRAOCULAR LENS PLACEMENT (IOC) LEFT DIABETIC;  Surgeon: Lockie Mola, MD;  Location: [facility];  Service: Ophthalmology;  Laterality: Left;  Diabetic - insulin and oral meds   COLONOSCOPY WITH PROPOFOL N/A 09/23/2014   Procedure: COLONOSCOPY WITH PROPOFOL;  Surgeon: Christena Deem, MD;  Location: [facility] ENDOSCOPY;  Service: Endoscopy;  Laterality: N/A;   COLONOSCOPY WITH PROPOFOL N/A 01/11/2018   Procedure: COLONOSCOPY WITH PROPOFOL;  Surgeon: Christena Deem, MD;  Location: [facility] ENDOSCOPY;  Service: Endoscopy;  Laterality: N/A;   COLONOSCOPY WITH PROPOFOL N/A 04/24/2018   Procedure: COLONOSCOPY WITH PROPOFOL;  Surgeon: Christena Deem, MD;  Location: [facility] ENDOSCOPY;  Service: Endoscopy;  Laterality: N/A;   COLONOSCOPY WITH PROPOFOL N/A 11/19/2019   Procedure: COLONOSCOPY WITH PROPOFOL;  Surgeon: Midge Minium, MD;  Location: [facility] ENDOSCOPY;  Service: Endoscopy;  Laterality: N/A;   CORONARY ANGIOPLASTY WITH STENT PLACEMENT  2008   CORONARY STENT INTERVENTION N/A 03/24/2020   Procedure: CORONARY STENT INTERVENTION;  Surgeon: Yvonne Kendall, MD;  Location: [facility] INVASIVE CV LAB;  Service: Cardiovascular;  Laterality: N/A;   ESOPHAGOGASTRODUODENOSCOPY (EGD) WITH PROPOFOL N/A 12/30/2014   Procedure: ESOPHAGOGASTRODUODENOSCOPY (EGD) WITH PROPOFOL;  Surgeon: Christena Deem, MD;  Location: [facility] ENDOSCOPY;  Service: Endoscopy;  Laterality: N/A;   ESOPHAGOGASTRODUODENOSCOPY (EGD) WITH PROPOFOL N/A 07/19/2016   Procedure: ESOPHAGOGASTRODUODENOSCOPY (EGD) WITH PROPOFOL;  Surgeon: Christena Deem, MD;  Location: [facility] ENDOSCOPY;  Service: Endoscopy;  Laterality: N/A;   ESOPHAGOGASTRODUODENOSCOPY (EGD) WITH PROPOFOL N/A 04/24/2018   Procedure: ESOPHAGOGASTRODUODENOSCOPY (EGD) WITH PROPOFOL;  Surgeon: Christena Deem, MD;  Location:  [facility] ENDOSCOPY;  Service: Endoscopy;  Laterality: N/A;   hysterectomy (other)     LEFT HEART CATH AND CORONARY ANGIOGRAPHY N/A 03/24/2020   Procedure: LEFT HEART CATH AND CORONARY ANGIOGRAPHY;  Surgeon: Yvonne Kendall, MD;  Location: [facility] INVASIVE CV LAB;  Service: Cardiovascular;  Laterality: N/A;    Family History: Family History  Problem Relation Age of Onset   Heart failure Mother    Stroke Mother    Diabetes Mother    Congestive Heart Failure Mother    Lung cancer Father    Breast cancer Maternal Aunt     Social History  reports that she has been smoking cigarettes. She has a 40 pack-year smoking history. She has never used smokeless tobacco. She reports current alcohol use. She reports that she does not use drugs.  Allergies  Allergen Reactions   Spironolactone Shortness Of Breath   Sulfa Antibiotics Other (See Comments) and Rash  Mouth blisters  Welts on mouth   Sulfonamide Derivatives Other (See Comments)    Last taken as a child; made her mouth break out   Other Other (See Comments)    Mouth blisters   Codeine Nausea And Vomiting and Other (See Comments)    Nausea   Prednisone Palpitations and Other (See Comments)    Made her legs "feel weird."    Medications   Current Facility-Administered Medications:    acetaminophen (TYLENOL) tablet 650 mg, 650 mg, Oral, Q6H PRN **OR** acetaminophen (TYLENOL) suppository 650 mg, 650 mg, Rectal, Q6H PRN, Enedina Finner, MD   ALPRAZolam Prudy Feeler) tablet 0.25 mg, 0.25 mg, Oral, QHS PRN, Enedina Finner, MD, 0.25 mg at 03/17/23 1936   calcium carbonate (TUMS - dosed in mg elemental calcium) chewable tablet 200 mg of elemental calcium, 1 tablet, Oral, Daily PRN, Enedina Finner, MD   clopidogrel (PLAVIX) tablet 75 mg, 75 mg, Oral, Daily, Enedina Finner, MD, 75 mg at 03/18/23 1028   cyanocobalamin (VITAMIN B12) tablet 1,000 mcg, 1,000 mcg, Oral, Daily, Enedina Finner, MD, 1,000 mcg at 03/18/23 1029   dapagliflozin propanediol (FARXIGA) tablet 5 mg,  5 mg, Oral, QAC breakfast, Enedina Finner, MD, 5 mg at 03/18/23 9811   docusate sodium (COLACE) capsule 100 mg, 100 mg, Oral, BID, Enedina Finner, MD, 100 mg at 03/18/23 1028   enoxaparin (LOVENOX) injection 40 mg, 40 mg, Subcutaneous, Q24H, Enedina Finner, MD, 40 mg at 03/17/23 2304   ezetimibe (ZETIA) tablet 10 mg, 10 mg, Oral, Daily, Enedina Finner, MD, 10 mg at 03/18/23 1028   insulin aspart (novoLOG) injection 0-5 Units, 0-5 Units, Subcutaneous, QHS, Enedina Finner, MD   insulin aspart (novoLOG) injection 0-9 Units, 0-9 Units, Subcutaneous, TID WC, Enedina Finner, MD   insulin glargine-yfgn (SEMGLEE) injection 22 Units, 22 Units, Subcutaneous, Daily, Enedina Finner, MD, 22 Units at 03/18/23 1030   lisinopril (ZESTRIL) tablet 20 mg, 20 mg, Oral, Daily, Enedina Finner, MD, 20 mg at 03/18/23 1029   metoprolol tartrate (LOPRESSOR) tablet 100 mg, 100 mg, Oral, BID, Enedina Finner, MD, 100 mg at 03/18/23 1029   multivitamin with minerals tablet 1 tablet, 1 tablet, Oral, Daily, Enedina Finner, MD, 1 tablet at 03/18/23 1029   ondansetron (ZOFRAN) tablet 4 mg, 4 mg, Oral, Q6H PRN **OR** ondansetron (ZOFRAN) injection 4 mg, 4 mg, Intravenous, Q6H PRN, Enedina Finner, MD   pantoprazole (PROTONIX) EC tablet 40 mg, 40 mg, Oral, Daily, Enedina Finner, MD, 40 mg at 03/18/23 1029   rosuvastatin (CRESTOR) tablet 20 mg, 20 mg, Oral, Daily, Enedina Finner, MD, 20 mg at 03/18/23 1028   senna-docusate (Senokot-S) tablet 1 tablet, 1 tablet, Oral, QHS PRN, Enedina Finner, MD  Current Outpatient Medications:    acetaminophen (TYLENOL) 500 MG tablet, Take 500 mg by mouth every 6 (six) hours as needed., Disp: , Rfl:    albuterol (VENTOLIN HFA) 108 (90 Base) MCG/ACT inhaler, Inhale 2 puffs into the lungs every 4 (four) hours as needed for wheezing or shortness of breath., Disp: 48 g, Rfl: 3   aspirin (ASPIR-81) 81 MG EC tablet, Take 81 mg by mouth daily., Disp: , Rfl:    calcium carbonate (TUMS - DOSED IN MG ELEMENTAL CALCIUM) 500 MG chewable tablet, Chew 1  tablet by mouth daily as needed., Disp: , Rfl:    clopidogrel (PLAVIX) 75 MG tablet, Take 1 tablet (75 mg total) by mouth daily., Disp: 90 tablet, Rfl: 3   cyanocobalamin 1000 MCG tablet, Take 1,000 mcg by mouth daily., Disp: , Rfl:  dapagliflozin propanediol (FARXIGA) 5 MG TABS tablet, Take 1 tablet (5 mg total) by mouth daily before breakfast. (Patient taking differently: Take 2.5 mg by mouth 2 (two) times daily.), Disp: 90 tablet, Rfl: 3   Docusate Sodium (DSS) 100 MG CAPS, Take 1 capsule by mouth daily., Disp: , Rfl:    ezetimibe (ZETIA) 10 MG tablet, Take 1 tablet (10 mg total) by mouth daily., Disp: 90 tablet, Rfl: 3   Fluticasone-Umeclidin-Vilant (TRELEGY ELLIPTA) 100-62.5-25 MCG/ACT AEPB, Inhale 1 puff into the lungs daily., Disp: 90 each, Rfl: 3   HYDROcodone-acetaminophen (NORCO) 7.5-325 MG tablet, Take 1 tablet by mouth every 6 (six) hours as needed for moderate pain (pain score 4-6)., Disp: 120 tablet, Rfl: 0   insulin glargine (LANTUS) 100 UNIT/ML injection, Inject 22 Units into the skin daily., Disp: , Rfl:    lisinopril (ZESTRIL) 20 MG tablet, Take 1 tablet (20 mg total) by mouth daily., Disp: 90 tablet, Rfl: 3   meclizine (ANTIVERT) 25 MG tablet, Take 25 mg by mouth 3 (three) times daily as needed., Disp: , Rfl:    metoprolol tartrate (LOPRESSOR) 100 MG tablet, Take 1 tablet (100 mg total) by mouth 2 (two) times daily., Disp: 180 tablet, Rfl: 3   Multiple Vitamins-Minerals (DAILY MULTI) TABS, Take 1 tablet by mouth daily., Disp: , Rfl:    nitroGLYCERIN (NITROSTAT) 0.4 MG SL tablet, Place 1 tablet (0.4 mg total) under the tongue every 5 (five) minutes as needed., Disp: 25 tablet, Rfl: 3   Omega-3 Fatty Acids (FISH OIL) 1000 MG CAPS, Take 2 capsules by mouth in the morning and at bedtime. , Disp: , Rfl:    ondansetron (ZOFRAN ODT) 4 MG disintegrating tablet, Take 1 tablet (4 mg total) by mouth every 8 (eight) hours as needed for nausea or vomiting., Disp: 20 tablet, Rfl: 0    RABEprazole (ACIPHEX) 20 MG tablet, Take 1 tablet (20 mg total) by mouth daily. 15 Mins. before evening meal, Disp: 90 tablet, Rfl: 3   rosuvastatin (CRESTOR) 20 MG tablet, Take 1 tablet (20 mg total) by mouth daily., Disp: 90 tablet, Rfl: 3   Semaglutide,0.25 or 0.5MG /DOS, 2 MG/1.5ML SOPN, Inject 0.5 mg into the skin once a week., Disp: , Rfl:    sucralfate (CARAFATE) 1 g tablet, Take 1 tablet (1 g total) by mouth 2 (two) times daily. 15 Minutes before evening meal and at bed time, Disp: 180 tablet, Rfl: 3   amLODipine (NORVASC) 5 MG tablet, DO NOT FILL AMLODIPINE DUE TO PATIENT IS ALLERGIC TO MED PRESCRIBED (Patient not taking: Reported on 03/18/2023), Disp: 90 tablet, Rfl: 0  Vitals   Vitals:   03/18/23 0900 03/18/23 1015 03/18/23 1029 03/18/23 1030  BP: 123/88  123/88   Pulse: 70 80 80 68  Resp: 17 19  16   Temp:      TempSrc:      SpO2: 93% 93%  90%  Weight:      Height:        Body mass index is 23.93 kg/m.  Physical Exam  General: Awake alert, somewhat tearful HEENT: Normocephalic atraumatic Lungs: Distant breath sounds Neurological exam She is awake alert oriented x 3 Her speech is mild to moderately dysarthric She has mild aphasia while speaking a longer sentences wire she either cannot find words or substitutes words but can still have a pretty intelligible conversation. Cranial nerves II to XII intact Motor examination with no drift in any of the 4 extremities Sensation Eglitis Coordination exam-no dysmetria  Labs/Imaging/Neurodiagnostic studies  CBC:  Recent Labs  Lab 03/17/23 1421  WBC 7.6  NEUTROABS 4.2  HGB 17.7*  HCT 52.7*  MCV 96.0  PLT 201   Basic Metabolic Panel:  Lab Results  Component Value Date   NA 142 03/17/2023   K 3.7 03/17/2023   CO2 25 03/17/2023   GLUCOSE 112 (H) 03/17/2023   BUN 17 03/17/2023   CREATININE 1.41 (H) 03/17/2023   CALCIUM 9.7 03/17/2023   GFRNONAA 39 (L) 03/17/2023   GFRAA 55 (L) 03/31/2020   Lipid Panel:  Lab  Results  Component Value Date   LDLCALC 73 04/08/2022   HgbA1c:  Lab Results  Component Value Date   HGBA1C 7.1 (H) 03/17/2023   Urine Drug Screen:     Component Value Date/Time   LABOPIA NONE DETECTED 03/17/2023 0752   COCAINSCRNUR NONE DETECTED 03/17/2023 0752   LABBENZ POSITIVE (A) 03/17/2023 0752   AMPHETMU NONE DETECTED 03/17/2023 0752   THCU NONE DETECTED 03/17/2023 0752   LABBARB NONE DETECTED 03/17/2023 0752    Alcohol Level     Component Value Date/Time   ETH <10 03/17/2023 1421   INR  Lab Results  Component Value Date   INR 1.0 03/17/2023   APTT  Lab Results  Component Value Date   APTT 30 03/17/2023    CT Head without contrast(Personally reviewed): No acute issues  CT angio Head and Neck with contrast(Personally reviewed): Not done due to deranged renal function  MR Angio head without contrast and Carotid Duplex BL(Personally reviewed): Strong suspicion for severe stenosis of the distal left M1 segment.  Left MCA bifurcation appears patent but stenotic.  No discrete branch occlusion.  Also severe stenosis or occlusion of the right ACA in the distal A2 A3 segment new since 2018. Carotid Dopplers: Mild amount of calcified plaque at the level of both carotid bulbs and proximal ICAs.  Estimate ICA stenosis less than 50%  MRI Brain(Personally reviewed): 2 small punctate infarcts in the left corona radiata.    ASSESSMENT   Hannah Kirby is a 78 y.o. female with hx of anxiety, coronary artery disease, COPD, diabetes, diastolic CHF, hyperlipidemia, hypertension, panic disorder, prior stroke with no residual deficits presented to the emergency department for evaluation of word finding difficulty.  Family noticed that since Wednesday, 03/15/2023 she has been having some slurred speech and some difficulty with her words.  MRI reveals 2 punctate infarcts in the left corona radiata/subcortical white matter in the left posterior frontal lobe. Initially refused  workup but then eventually agreed to it.  MR angio of the head and neck reveals strong suspicion for severe stenosis of the distal left M1 segment which is new from 2018 as well as severe stenosis or occlusion of the right A2/A3 segment which is new since 2018. Neck imaging with carotid Dopplers  RECOMMENDATIONS  She did not want to wait for an echocardiogram-recommend follow-up with her outpatient cardiologist in the next week to get an echocardiogram. Continue DAPT Continue home statin-her LDL is near goal with Zetia 10 mg and Crestor 20. Follow therapy recommendations Outpatient neurology follow-up-established patient of [facility] neurology-in the next 2 to 4 weeks. Plan discussed with Dr. Eliane Decree, hospitalist ______________________________________________________________________    Signed, Milon Dikes, MD [facility]

## 2023-03-18 NOTE — Evaluation (Signed)
Occupational Therapy Evaluation Patient Details Name: Hannah Kirby MRN: [id number] DOB: 1945-06-03 Today's Date: 03/18/2023   History of Present Illness 78 y.o. female with PMHx significant of    HTN, CKD stage IIIB, DMT2 on insulin, CAD, chronic smoking, chronic respiratory failure with COPD and on home oxygen, anxiety, HLD comes to the emergency room when patient noticed word finding and dysarthria for last three days.   Clinical Impression   Pt was seen for OT evaluation this date. Prior to hospital admission, pt was generally independent with ADL. Pt lives with her spouse in a 1 story home and cares some for her spouse. Daughter has been helping intermittently. Pt presents to acute OT demonstrating impaired ADL performance and functional mobility 2/2 decreased strength, activity tolerance, balance, and decreased awareness of safety/deficits (See OT problem list for additional functional deficits). Pt on room air noted to have nasal cannula on the bed next to her. She endorses she removed it to blow her nose and never put it back on. SpO2 in low 80's on RA. With 2L reapplied, back up to mid 90's, low 90's with exertion. Pt currently requires increased time/effort to complete ADL, CGA/handheld assist for mobility, and increased time/effort to express thoughts/understanding having difficulty with word finding. Pt/dtr educated in falls prevention, use of home O2 to promote safety/indep and health, educated in ECS. Pt would benefit from additional skilled OT services upon discharge to address noted impairments and functional limitations (see below for any additional details) in order to maximize safety and independence while minimizing falls risk and caregiver burden.     If plan is discharge home, recommend the following: A little help with walking and/or transfers;Assistance with cooking/housework;Assist for transportation;Help with stairs or ramp for entrance    Functional Status Assessment   Patient has had a recent decline in their functional status and demonstrates the ability to make significant improvements in function in a reasonable and predictable amount of time.  Equipment Recommendations  None recommended by OT    Recommendations for Other Services       Precautions / Restrictions Precautions Precautions: Fall Restrictions Weight Bearing Restrictions Per Provider Order: No      Mobility Bed Mobility Overal bed mobility: Needs Assistance Bed Mobility: Supine to Sit, Sit to Supine     Supine to sit: Supervision Sit to supine: Supervision        Transfers Overall transfer level: Needs assistance Equipment used: 1 person hand held assist Transfers: Sit to/from Stand Sit to Stand: Contact guard assist                  Balance Overall balance assessment: Needs assistance Sitting-balance support: Feet supported, No upper extremity supported Sitting balance-Leahy Scale: Good     Standing balance support: Single extremity supported Standing balance-Leahy Scale: Fair                             ADL either performed or assessed with clinical judgement   ADL Overall ADL's : Modified independent                                       General ADL Comments: increased time/effort to complete LB ADL tasks but able to do it without direct assist     Vision         Perception  Praxis         Pertinent Vitals/Pain Pain Assessment Pain Assessment: Faces Faces Pain Scale: Hurts a little bit Pain Location: low back Pain Descriptors / Indicators: Aching Pain Intervention(s): Monitored during session, Limited activity within patient's tolerance, Repositioned     Extremity/Trunk Assessment Upper Extremity Assessment Upper Extremity Assessment: Generalized weakness;Right hand dominant   Lower Extremity Assessment Lower Extremity Assessment: Defer to PT evaluation   Cervical / Trunk Assessment Cervical /  Trunk Assessment: Normal   Communication Communication Communication: Difficulty communicating thoughts/reduced clarity of speech   Cognition Arousal: Alert Behavior During Therapy: WFL for tasks assessed/performed Overall Cognitive Status: Impaired/Different from baseline Area of Impairment: Safety/judgement, Problem solving                         Safety/Judgement: Decreased awareness of deficits, Decreased awareness of safety   Problem Solving: Slow processing       General Comments  [place] off, SpO2 in low 80's on RA. With 2L reapplied, back up to mid 90's, low 90's with exertion    Exercises Other Exercises Other Exercises: Pt/dtr educated in falls prevention, use of home O2 to promote safety/indep and health, educated in ECS   Shoulder Instructions      Home Living Family/patient expects to be discharged to:: Private residence Living Arrangements: Spouse/significant other Available Help at Discharge: Family;Available PRN/intermittently Type of Home: House Home Access: Stairs to enter [employer] of Steps: a couple Entrance Stairs-Rails: None Home Layout: One level     Bathroom Shower/Tub: Tub/shower unit;Walk-in shower   Bathroom Toilet: Handicapped height     Home Equipment: Cane - single point;Rollator (4 wheels)          Prior Functioning/Environment Prior Level of Function : Driving;History of Falls (last six months);Independent/Modified Independent             Mobility Comments: pt denies AD use, endorses falls ADLs Comments: Pt reports indep with ADL and IADL, pt/family report she is supposed to wear 2L O2 24/7 but doesn't        OT Problem List: Decreased strength;Decreased activity tolerance;Decreased safety awareness;Cardiopulmonary status limiting activity;Decreased cognition;Impaired balance (sitting and/or standing);Decreased knowledge of use of DME or AE      OT Treatment/Interventions:      OT Goals(Current goals  can be found in the care plan section) Acute Rehab OT Goals Patient Stated Goal: go home asap OT Goal Formulation: All assessment and education complete, DC therapy  OT Frequency:      Co-evaluation              AM-PAC OT "6 Clicks" Daily Activity     Outcome Measure Help from another person eating meals?: None Help from another person taking care of personal grooming?: None Help from another person toileting, which includes using toliet, bedpan, or urinal?: A Little Help from another person bathing (including washing, rinsing, drying)?: A Little Help from another person to put on and taking off regular upper body clothing?: None Help from another person to put on and taking off regular lower body clothing?: A Little 6 Click Score: 21   End of Session Equipment Utilized During Treatment: Oxygen  Activity Tolerance: Patient tolerated treatment well Patient left: in bed;with call bell/phone within reach;with family/visitor present  OT Visit Diagnosis: Other abnormalities of gait and mobility (R26.89);Repeated falls (R29.6);Muscle weakness (generalized) (M62.81)                Time: 2536-6440 OT  Time Calculation (min): 28 min Charges:  OT General Charges $OT Visit: 1 Visit OT Evaluation $OT Eval Low Complexity: 1 Low OT Treatments $Therapeutic Activity: 8-22 mins  Arman Filter., MPH, MS, OTR/L ascom [phone] 03/18/23, 1:13 PM

## 2023-03-18 NOTE — ED Notes (Signed)
Neurologist at bedside.

## 2023-03-18 NOTE — ED Notes (Signed)
Pt took medications w/I issue.

## 2023-03-18 NOTE — Discharge Instructions (Signed)
Pt and family advised to get echo as out pt with [facility] cardiology

## 2023-03-18 NOTE — Plan of Care (Signed)
Curbside recommendations Contacted by hospitalist regarding stroke on patient MRI. Patient has 2 areas of what looks like punctate subcortical infarcts-most likely concomitant small vessel disease.  She has outpatient follow-up scheduled with [facility] clinic neurology. I have recommended that her head and neck imaging be done, as she already has an MRI done and this can all be followed up with her outpatient neurologist since she is not willing to stay in for consultation.   -- Milon Dikes, MD Neurologist [facility]

## 2023-03-18 NOTE — ED Notes (Signed)
PT at bedside.

## 2023-03-18 NOTE — ED Notes (Signed)
Assumed care of pt at this time. Pt denies needs.

## 2023-03-18 NOTE — Progress Notes (Signed)
CSW spoke with patient and patients daughter Hannah Kirby at bedside. Family agrees with home health recommendations. CSW spoke with Elnita Maxwell with [facility] who can accepts patient for PT,OT, and speech. [facility] can possibly start tomorrow or Monday.

## 2023-03-18 NOTE — ED Notes (Signed)
Pt was able to walk to restroom and back w/ 1 staff assist.  Steady gait, but Pt c/o mild dizziness.

## 2023-03-18 NOTE — ED Notes (Addendum)
Marland Kitchen

## 2023-03-18 NOTE — ED Notes (Signed)
Pt provided w/ drink per pt request.

## 2023-03-18 NOTE — Evaluation (Addendum)
Speech Language Pathology Evaluation Patient Details Name: DAFINA SUK MRN: [id number] DOB: [birth year] Today's Date: 03/18/2023 Time: 4098-1191 SLP Time Calculation (min) (ACUTE ONLY): 25 min  Problem List:  Patient Active Problem List   Diagnosis Date Noted   Dysarthria 03/17/2023   Chronic obstructive pulmonary disease (HCC) 03/17/2023   Dizziness 02/23/2023   Dehydration 02/20/2023   Restrictive airway disease 07/21/2020   Partial thickness rotator cuff tear 03/20/2020   Shoulder pain 03/05/2020   Retinal detachment, left 03/04/2020   Ataxia 02/28/2020   Difficulty walking 02/28/2020   Physical deconditioning 02/28/2020   Chronic diastolic congestive heart failure (HCC) 11/20/2019   Polyp of ascending colon    Hypokalemia 08/08/2019   TIA (transient ischemic attack) 08/01/2019   Benign hypertensive kidney disease with chronic kidney disease 04/25/2019   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   Chronic, continuous use of opioids 03/26/2018   History of adenomatous polyp of colon 01/22/2018   Ulceration of colon determined by endoscopy 01/22/2018   Diverticulosis of colon 01/22/2018   History of CVA (cerebrovascular accident) 11/08/2016   Weakness of left upper extremity 10/30/2016   AAA (abdominal aortic aneurysm) without rupture (HCC) 08/12/2016   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   Stage 3b chronic kidney disease (HCC) 07/27/2015   Chest pain at rest 05/06/2015   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HCC) 10/28/2014   Solitary pulmonary nodule on lung CT 08/20/2014   Allergic rhinitis 07/28/2014   CAFL (chronic airflow limitation) (HCC) 07/28/2014   Acid reflux 07/28/2014   Granuloma annulare    Back pain 11/12/2013   Lumbar scoliosis 10/30/2013   Lumbar radiculopathy 10/30/2013   Lumbar canal stenosis 10/30/2013   Calcium blood increased 10/22/2013    Proteinuria 10/22/2013   Obesity 03/21/2011   Smokes with greater than 30 pack year history 03/21/2011   Edema 08/18/2010   Hyperlipidemia 03/25/2009   Coronary artery disease 03/25/2009   Primary hypertension 03/25/2009   Past Medical History:  Past Medical History:  Diagnosis Date   Acute hemorrhagic colitis 08/08/2019   Anemia    Anxiety    Arthritis    C. difficile colitis 08/09/2019   CAD (coronary artery disease)    a. 02/2006 PCI: BMS x 2 to RCA, cath o/w without significant coronary disease; b. nuclear stress test 07/2014: No ischemia/infarct; c. 11/2017 MV: no isch/infarct, EF 55-65%; d. 03/2020 NSTEMI/PCI: LM nl, LAD min irregs, RI 25, small, LCX nl, RCA 30p/m ISR, 99d (3.0x15 Resolute Onyx DES).   Chronic bronchitis (HCC)    secondary to cigarette smoking   Chronic kidney disease (CKD), stage III (moderate) (HCC)    COPD (chronic obstructive pulmonary disease) (HCC)    Diabetes mellitus    Diastolic dysfunction    a. 07/2019 Echo: EF 60-65%, no rwma, mod LVH. Nl RV size/fxn; b. 03/2020 Echo: EF 60-65%, mod LVH, gr1 DD, nl RV size/fxn, mild AS.   FHx: allergies    GERD (gastroesophageal reflux disease)    Goiter    Granulomatous disease (HCC)    Hernia    Hyperlipidemia    Hypertension    Kidney stone on left side 2013   Microalbuminuria    NSTEMI (non-ST elevated myocardial infarction) (HCC) 03/23/2020   Obesity    Panic attacks    PVC's (premature ventricular contractions)    a. 03/2018 Zio: Occas PVCs (2.5%). Triggered events assoc w/ PVC/PAC.   Retinal tear 2020  Smokers' cough (HCC)    Spinal stenosis    Stroke (HCC) 10/29/2016   mild left side weakness   Stroke (HCC) 08/01/2019   Tobacco abuse    Past Surgical History:  Past Surgical History:  Procedure Laterality Date   ABDOMINAL HYSTERECTOMY     BREAST SURGERY     CATARACT EXTRACTION W/PHACO Right 03/01/2017   Procedure: CATARACT EXTRACTION PHACO AND INTRAOCULAR LENS PLACEMENT (IOC) RIGHT DIABETIC;   Surgeon: Lockie Mola, MD;  Location: [facility];  Service: Ophthalmology;  Laterality: Right;   CATARACT EXTRACTION W/PHACO Left 03/22/2017   Procedure: CATARACT EXTRACTION PHACO AND INTRAOCULAR LENS PLACEMENT (IOC) LEFT DIABETIC;  Surgeon: Lockie Mola, MD;  Location: [facility];  Service: Ophthalmology;  Laterality: Left;  Diabetic - insulin and oral meds   COLONOSCOPY WITH PROPOFOL N/A 09/23/2014   Procedure: COLONOSCOPY WITH PROPOFOL;  Surgeon: Christena Deem, MD;  Location: [facility] ENDOSCOPY;  Service: Endoscopy;  Laterality: N/A;   COLONOSCOPY WITH PROPOFOL N/A 01/11/2018   Procedure: COLONOSCOPY WITH PROPOFOL;  Surgeon: Christena Deem, MD;  Location: [facility] ENDOSCOPY;  Service: Endoscopy;  Laterality: N/A;   COLONOSCOPY WITH PROPOFOL N/A 04/24/2018   Procedure: COLONOSCOPY WITH PROPOFOL;  Surgeon: Christena Deem, MD;  Location: [facility] ENDOSCOPY;  Service: Endoscopy;  Laterality: N/A;   COLONOSCOPY WITH PROPOFOL N/A 11/19/2019   Procedure: COLONOSCOPY WITH PROPOFOL;  Surgeon: Midge Minium, MD;  Location: [facility] ENDOSCOPY;  Service: Endoscopy;  Laterality: N/A;   CORONARY ANGIOPLASTY WITH STENT PLACEMENT  2008   CORONARY STENT INTERVENTION N/A 03/24/2020   Procedure: CORONARY STENT INTERVENTION;  Surgeon: Yvonne Kendall, MD;  Location: [facility] INVASIVE CV LAB;  Service: Cardiovascular;  Laterality: N/A;   ESOPHAGOGASTRODUODENOSCOPY (EGD) WITH PROPOFOL N/A 12/30/2014   Procedure: ESOPHAGOGASTRODUODENOSCOPY (EGD) WITH PROPOFOL;  Surgeon: Christena Deem, MD;  Location: [facility] ENDOSCOPY;  Service: Endoscopy;  Laterality: N/A;   ESOPHAGOGASTRODUODENOSCOPY (EGD) WITH PROPOFOL N/A 07/19/2016   Procedure: ESOPHAGOGASTRODUODENOSCOPY (EGD) WITH PROPOFOL;  Surgeon: Christena Deem, MD;  Location: [facility] ENDOSCOPY;  Service: Endoscopy;  Laterality: N/A;   ESOPHAGOGASTRODUODENOSCOPY (EGD) WITH PROPOFOL N/A 04/24/2018   Procedure: ESOPHAGOGASTRODUODENOSCOPY (EGD) WITH PROPOFOL;   Surgeon: Christena Deem, MD;  Location: [facility] ENDOSCOPY;  Service: Endoscopy;  Laterality: N/A;   hysterectomy (other)     LEFT HEART CATH AND CORONARY ANGIOGRAPHY N/A 03/24/2020   Procedure: LEFT HEART CATH AND CORONARY ANGIOGRAPHY;  Surgeon: Yvonne Kendall, MD;  Location: [facility] INVASIVE CV LAB;  Service: Cardiovascular;  Laterality: N/A;   HPI:  Delynda Sepulveda Feltner is a 78 y.o. female with medical history significant of hypertension, chronic kidney disease stage IIIB, type II diabetes on insulin, CAD, chronic smoking, chronic respiratory failure with COPD and on home oxygen, anxiety, hyperlipidemia comes to the emergency room when patient noticed word finding and dysarthria for last three days. MRA Head: Strong evidence of Severe stenosis of the distal Left MCA M1 segment. Left MCA bifurcation appears patent but stenotic. No discrete branch  occlusion when compared to 2018 MRA. 2. Positive also for Severe stenosis or occlusion of right ACA in the distal A2/A3 segment, also new since 2018. MRI Head 03/17/23: Punctate foci of restricted diffusion in the left posterior frontal white matter, most likely acute infarcts. Numerous foci of hemosiderin deposition, which are seen in the deep gray structures but also in the bilateral cerebral hemispheres, with 1 new focus compared to 02/20/2023. While this could be the sequela of chronic hypertensive microhemorrhages, this appearance is concerning for cerebral amyloid angiopathy.  Assessment / Plan / Recommendation Clinical Impression  Pt seen for cognitive linguistic evaluation in the setting of stroke. Assessment consisting of pt/family interview and dynamic language assessment (assessment limited secondary to non preferred setting for assessment- in hallway of the ED and pt anxiety/tearfulness regarding being in the hospital). Expressive language notable for intermittent perseveration, phonemic/semantic paraphasias, and halted pattern. Continuous speech at  grossly 4-5 word utterance length, though suspect pt was limiting speech secondary to frustration. Repetition intact for word and short phrase, with increased challenge with sentence level repetition. 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 Receptive language grossly functional for multi step commands and semi-complex WH prompts. Pt with emerging awareness and frustration for current deficits. Education shared with patient/daughter regarding impact of fatigue and emotional factors when presented with language impairment, both reported understanding. Further assessment warranted across language and cognitive domains upon next level of care based on pt preference to discharge from hospital.  Pt passes nursing swallow screen and denied issue with PO intake. 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  Recommendations may be updated based on patient status, additional functional criteria and insurance authorization.    Follow Up Recommendations  Follow physician's recommendations for discharge plan and follow up therapies    Assistance Recommended at Discharge  Intermittent Supervision/Assistance  Functional Status Assessment Patient has had a recent decline in their functional status and demonstrates the ability to make significant improvements in function in a reasonable and predictable amount of time.  Frequency and Duration           SLP Evaluation Cognition  Overall Cognitive Status: Impaired/Different from baseline Arousal/Alertness: Awake/alert Orientation Level: Oriented X4 Attention: Sustained (for  purposes of assessment) Sustained Attention: Appears intact Memory:  (not completed secondary to linguistic demand) Awareness:  (not completed secondary to linguistic demand) Problem Solving:  (not completed secondary to linguistic demand) Executive Function:  (not completed secondary to linguistic demand) Behaviors: Perseveration;Other (comment) (tearful) Safety/Judgment:  (not completed secondary to linguistic demand)       Comprehension  Auditory Comprehension Overall Auditory Comprehension: Appears within functional limits for tasks assessed Commands: Within Functional Limits (up to three steps) Conversation: Simple Interfering Components:  (loud/distracting environment) EffectiveTechniques: Repetition;Increased volume Visual Recognition/Discrimination Discrimination: Within Function Limits Reading Comprehension Reading Status: Not tested    Expression Expression Primary Mode of Expression: Nonverbal - gestures Verbal Expression Overall Verbal Expression: Impaired Initiation: No impairment Automatic Speech: Name Level of Generative/Spontaneous Verbalization: Conversation Repetition: Impaired Level of Impairment: Sentence level Naming: Impairment Responsive: 51-75% accurate (2/4) Confrontation: Impaired Convergent: 75-100% accurate (8/10) Verbal Errors: Semantic paraphasias;Phonemic paraphasias;Perseveration;Aware of errors Pragmatics:  (will be further assessed) Effective Techniques: Semantic cues;Open ended questions Written Expression Written Expression: Not tested   Oral / Motor  Oral Motor/Sensory Function Overall Oral Motor/Sensory Function: Within functional limits Motor Speech Overall Motor Speech: Impaired Respiration: Within functional limits Phonation: Normal Resonance: Within functional limits Articulation: Within functional limitis Intelligibility: Intelligible Motor Planning: Impaired Level of Impairment: Sentence Motor Speech Errors: Groping for  words;Inconsistent;Aware           [place] Valarie Farace Clapp  MS [facility] SLP   [place] J Clapp 03/18/2023, 12:55 PM

## 2023-03-20 ENCOUNTER — Telehealth: Payer: Self-pay | Admitting: Cardiovascular Disease

## 2023-03-20 ENCOUNTER — Other Ambulatory Visit: Payer: Self-pay | Admitting: Emergency Medicine

## 2023-03-20 DIAGNOSIS — I69322 Dysarthria following cerebral infarction: Secondary | ICD-10-CM | POA: Diagnosis not present

## 2023-03-20 DIAGNOSIS — I5032 Chronic diastolic (congestive) heart failure: Secondary | ICD-10-CM

## 2023-03-20 DIAGNOSIS — I129 Hypertensive chronic kidney disease with stage 1 through stage 4 chronic kidney disease, or unspecified chronic kidney disease: Secondary | ICD-10-CM | POA: Diagnosis not present

## 2023-03-20 DIAGNOSIS — I69398 Other sequelae of cerebral infarction: Secondary | ICD-10-CM | POA: Diagnosis not present

## 2023-03-20 DIAGNOSIS — I69311 Memory deficit following cerebral infarction: Secondary | ICD-10-CM | POA: Diagnosis not present

## 2023-03-20 DIAGNOSIS — I639 Cerebral infarction, unspecified: Secondary | ICD-10-CM

## 2023-03-20 DIAGNOSIS — I69314 Frontal lobe and executive function deficit following cerebral infarction: Secondary | ICD-10-CM | POA: Diagnosis not present

## 2023-03-20 DIAGNOSIS — J449 Chronic obstructive pulmonary disease, unspecified: Secondary | ICD-10-CM | POA: Diagnosis not present

## 2023-03-20 DIAGNOSIS — I2510 Atherosclerotic heart disease of native coronary artery without angina pectoris: Secondary | ICD-10-CM | POA: Diagnosis not present

## 2023-03-20 DIAGNOSIS — E1122 Type 2 diabetes mellitus with diabetic chronic kidney disease: Secondary | ICD-10-CM | POA: Diagnosis not present

## 2023-03-20 DIAGNOSIS — F419 Anxiety disorder, unspecified: Secondary | ICD-10-CM | POA: Diagnosis not present

## 2023-03-20 DIAGNOSIS — N1832 Chronic kidney disease, stage 3b: Secondary | ICD-10-CM | POA: Diagnosis not present

## 2023-03-20 DIAGNOSIS — E1140 Type 2 diabetes mellitus with diabetic neuropathy, unspecified: Secondary | ICD-10-CM | POA: Diagnosis not present

## 2023-03-20 DIAGNOSIS — Z9981 Dependence on supplemental oxygen: Secondary | ICD-10-CM | POA: Diagnosis not present

## 2023-03-20 DIAGNOSIS — F17210 Nicotine dependence, cigarettes, uncomplicated: Secondary | ICD-10-CM | POA: Diagnosis not present

## 2023-03-20 DIAGNOSIS — M6281 Muscle weakness (generalized): Secondary | ICD-10-CM | POA: Diagnosis not present

## 2023-03-20 DIAGNOSIS — E785 Hyperlipidemia, unspecified: Secondary | ICD-10-CM | POA: Diagnosis not present

## 2023-03-20 DIAGNOSIS — Z7901 Long term (current) use of anticoagulants: Secondary | ICD-10-CM | POA: Diagnosis not present

## 2023-03-20 DIAGNOSIS — Z7951 Long term (current) use of inhaled steroids: Secondary | ICD-10-CM | POA: Diagnosis not present

## 2023-03-20 DIAGNOSIS — Z7985 Long-term (current) use of injectable non-insulin antidiabetic drugs: Secondary | ICD-10-CM | POA: Diagnosis not present

## 2023-03-20 DIAGNOSIS — Z7982 Long term (current) use of aspirin: Secondary | ICD-10-CM | POA: Diagnosis not present

## 2023-03-20 DIAGNOSIS — Z8673 Personal history of transient ischemic attack (TIA), and cerebral infarction without residual deficits: Secondary | ICD-10-CM

## 2023-03-20 DIAGNOSIS — I69320 Aphasia following cerebral infarction: Secondary | ICD-10-CM | POA: Diagnosis not present

## 2023-03-20 NOTE — Telephone Encounter (Signed)
Per daughter pt was in the hospital for a Stoke. She was discharged Sat evening. Hospital wants her to have a Echo asap. Please advise

## 2023-03-20 NOTE — Telephone Encounter (Signed)
Per hospital notes:   -- Patient to get echo of the heart with Dr. Mariah Milling as outpatient- seen by neurology. Recommend continue aspirin Plavix and statin.   They also recommended follow up in office for 1 week.   Do you want her to be seen in 1 week and then have an echo, or go ahead and obtain an echo?   Thanks!

## 2023-03-22 ENCOUNTER — Other Ambulatory Visit: Payer: Self-pay | Admitting: Cardiovascular Disease

## 2023-03-22 ENCOUNTER — Telehealth: Payer: Self-pay

## 2023-03-22 ENCOUNTER — Ambulatory Visit: Payer: Medicare Other | Attending: Cardiovascular Disease

## 2023-03-22 DIAGNOSIS — I6389 Other cerebral infarction: Secondary | ICD-10-CM

## 2023-03-22 DIAGNOSIS — Z122 Encounter for screening for malignant neoplasm of respiratory organs: Secondary | ICD-10-CM

## 2023-03-22 DIAGNOSIS — F17210 Nicotine dependence, cigarettes, uncomplicated: Secondary | ICD-10-CM

## 2023-03-22 DIAGNOSIS — Z8673 Personal history of transient ischemic attack (TIA), and cerebral infarction without residual deficits: Secondary | ICD-10-CM | POA: Diagnosis not present

## 2023-03-22 DIAGNOSIS — I5032 Chronic diastolic (congestive) heart failure: Secondary | ICD-10-CM | POA: Diagnosis not present

## 2023-03-22 DIAGNOSIS — I639 Cerebral infarction, unspecified: Secondary | ICD-10-CM

## 2023-03-22 DIAGNOSIS — Z87891 Personal history of nicotine dependence: Secondary | ICD-10-CM

## 2023-03-22 NOTE — Telephone Encounter (Signed)
.  Lung Cancer Screening Narrative/Criteria Questionnaire (Cigarette Smokers Only- No Cigars/Pipes/vapes)   Hannah Kirby   SDMV:04/03/2023 at 10:30 Marcelline Mates, RN   1945-11-26   LDCT: 04/05/2023 at 11:00am @ [facility]    78 y.o.   Phone: [phone]  Lung Screening Narrative (confirm age 54-77 yrs Medicare / 50-80 yrs Private pay insurance)   Insurance information:[facility]/Medicare   Referring Provider:Fisher   This screening involves an initial phone call with a team member from our program. It is called a shared decision making visit. The initial meeting is required by  insurance and Medicare to make sure you understand the program. This appointment takes about 15-20 minutes to complete. You will complete the screening scan at your scheduled date/time.  This scan takes about 5-10 minutes to complete. You can eat and drink normally before and after the scan.  Criteria questions for Lung Cancer Screening:   Are you a current or former smoker? Current Age began smoking: 21   If you are a former smoker, what year did you quit smoking? (within 15 yrs)   To calculate your smoking history, I need an accurate estimate of how many packs of cigarettes you smoked per day and for how many years. (Not just the number of PPD you are now smoking)   Years smoking 56 x Packs per day 2 = Pack years 112   (at least 20 pack yrs)   (Make sure they understand that we need to know how much they have smoked in the past, not just the number of PPD they are smoking now)  Do you have a personal history of cancer?  No    Do you have a family history of cancer? Father lung  Are you coughing up blood?  No  Have you had unexplained weight loss of 15 lbs or more in the last 6 months? No  It looks like you meet all criteria.  When would be a good time for [place] to schedule you for this screening?   Additional information:

## 2023-03-23 ENCOUNTER — Telehealth: Payer: Self-pay | Admitting: Family Medicine

## 2023-03-23 LAB — ECHOCARDIOGRAM COMPLETE BUBBLE STUDY
AR max vel: 3.1 cm2
AV Area VTI: 3.27 cm2
AV Area mean vel: 2.98 cm2
AV Mean grad: 2 mm[Hg]
AV Peak grad: 4 mm[Hg]
Ao pk vel: 1 m/s
Area-P 1/2: 4.06 cm2
Calc EF: 47.1 %
S' Lateral: 1.9 cm
Single Plane A2C EF: 47.8 %
Single Plane A4C EF: 48.8 %

## 2023-03-23 NOTE — Telephone Encounter (Signed)
Clydie Braun, Speech therapist with [facility] calling stating she received orders to do an evaluation for patient this week. Ms Clydie Braun has been sick with the flu and requesting to reschedule to next week.   Requesting verbal orders to reschedule, [phone]

## 2023-03-23 NOTE — Progress Notes (Signed)
Cardiology Clinic Note   Date: 03/24/2023 ID: [name], DOB 12-11-1945, MRN [id number]  Primary Cardiologist:  Julien Nordmann, MD  Chief Complaint   Hannah Hannah Kirby Hannah Kirby is a 78 y.o. female who presents to the clinic today for hospital follow up.   Patient Profile   Hannah Hannah Kirby Hannah Kirby is followed by Dr. Mariah Milling for the history outlined below.      Past medical history significant for: CAD. PCI with BMS x 2 to RCA January 2008. LHC 03/24/2020 (NSTEMI): Severe single-vessel CAD with 99% distal RCA stenosis.  Mild, nonobstructive CAD with minimal luminal irregularities involving the LAD and 20 to 30% ostial ramus stenosis.  Patent proximal/mid RCA stents with mild to moderate in-stent restenosis (~30%).  Mildly elevated LVEDP.  PCI with DES 3 x 15 mm to distal RCA. Chronic diastolic heart failure. Echo with bubble study 03/22/2023: EF 60 to 65%.  No RWMA.  Moderate LVH.  Grade I DD.  Normal RV size/function.  Mild aortic valve sclerosis/calcification without stenosis.  Bubble study negative without evidence of interatrial shunt. AAA. AAA duplex 02/11/2022: Abnormal dilatation of proximal abdominal aorta, 3.8 cm.  Prior ultrasound with measurement 3.5 cm December 2022. Carotid artery disease. Carotid ultrasound 03/18/2023: Mild amount of calcified plaque at the level of both carotid bulbs and proximal internal carotid arteries.  Bilateral ICA stenosis <50%. Hypertension. Hyperlipidemia. Lipid panel 04/08/2022: LDL 73, HDL 62, TG 148, total 160. COPD. CVA. 2018 and 2021. MRI brain 03/17/2023: Punctate foci of restricted diffusion in the left posterior frontal white matter, most likely acute infarcts. Numerous foci of hemosiderin deposition, which are seen in the deep gray structures but also in the bilateral cerebral hemispheres, with 1 new focus compared to 02/20/2023. While this could be the sequela of chronic hypertensive microhemorrhages, this appearance is concerning for cerebral amyloid  angiopathy. MRA head 03/18/2023: Strong evidence of severe stenosis of the distal left MCA M1 segment.  Left MCA bifurcation appears patent bedside.  No discrete branch occlusion when compared to 2018.  Positive also for severe stenosis or occlusion of the right ACA in the distal A2/A3 segment new since 2018.  Stable MRA elsewhere no other intracranial artery stenosis identified. T2DM. CKD stage IIIb. Tobacco abuse.  In summary, Hannah Hannah Kirby Hannah Kirby is a longtime patient of Dr. Mariah Milling.  She has a history of CAD with PCI with BMS x 2 to RCA in January 2008.  Nuclear stress testing June 2016 and October 2019 were normal, low risk studies.  She has a history of mild carotid artery disease.  She undergoes yearly ultrasound for surveillance of AAA with last measurement 3.8 cm in December 2023.  In February 2022 she underwent LHC in the setting of NSTEMI and PCI was performed to RCA as detailed above.  Echo at that time showed EF 60 to 65%, no RWMA, moderate LVH, Grade I DD, normal RV size/function, mild aortic valve sclerosis without stenosis.  Patient was last seen in the office by Dr. Mariah Milling on 08/16/2022 for routine follow-up and preoperative risk assessment prior to lumbar epidural steroid injection.  She was doing well at that time with no cardiac complaints.  Patient presented to [facility] ED on 03/17/2023 with complaints of slurred speech, trouble with words, unsteady gait that began 2 days prior.  Initial labs: WBC 7.6, hemoglobin 17.7, sodium 142, potassium 3.7, creatinine 1.41, BUN 17, respiratory panel negative for COVID/flu/RSV.  Troponin negative x 2.  Chest x-ray without active cardiopulmonary disease.  MRI brain showed punctate foci  of restricted diffusion in the left posterior frontal white matter most likely acute infarcts (further details above).  Patient was discharged on 03/18/2023 to follow-up with PCP and neurology as an outpatient.  She was instructed to follow-up with cardiology to get an echo to  complete stroke workup.     History of Present Illness    Today, patient is accompanied by her daughter. Patient denies lower extremity edema, orthopnea or PND. No chest pain, pressure, or tightness. No palpitations. Patient is short of breath at baseline and is on 3 L continuous O2 via [place]. She has no complaints today. Discussed results of echo in detail and all questions were answered. Discussed further evaluation with 30 day cardiac monitor. Patient and daughter are in agreement with plan.    ROS: All other systems reviewed and are otherwise negative except as noted in History of Present Illness.  EKGs/Labs Reviewed    EKG Interpretation Date/Time:  Friday March 24 2023 14:25:50 EST Ventricular Rate:  70 PR Interval:  166 QRS Duration:  94 QT Interval:  430 QTC Calculation: 464 R Axis:   35  Text Interpretation: Normal sinus rhythm Possible Left atrial enlargement Possible Inferior infarct , age undetermined Nonspecific T wave abnormality When compared with ECG of 17-Mar-2023 13:44, No significant change Confirmed by Carlos Levering [phone]) on 03/24/2023 2:31:34 PM   03/17/2023: ALT 19; AST 25; BUN 17; Creatinine, Ser 1.41; Potassium 3.7; Sodium 142   03/17/2023: Hemoglobin 17.7; WBC 7.6   09/16/2022: TSH 2.630       Physical Exam    VS:  BP (!) 140/72 (BP Location: Left Arm, Patient Position: Sitting, Cuff Size: Normal)   Pulse 70   Ht 5\' 5"  (1.651 m)   Wt 147 lb 3.2 oz (66.8 kg)   BMI 24.50 kg/m  , BMI Body mass index is 24.5 kg/m.  GEN: Well nourished, well developed, in no acute distress. Neck: No JVD or carotid bruits. Cardiac:  RRR. No murmurs. No rubs or gallops.   Respiratory:  Respirations regular and unlabored. Clear to auscultation without rales, wheezing or rhonchi. GI: Soft, nontender, nondistended. Extremities: Radials/DP/PT 2+ and equal bilaterally. No clubbing or cyanosis. No edema.  Skin: Warm and dry, no rash. Neuro: Strength intact.  Assessment &  Plan   CAD S/p PCI with BMS x 2 to RCA January 2018, PCI with DES to distal RCA February 2022.  Patient denies chest pain, pressure or tightness.  -Continue aspirin, Plavix, rosuvastatin, Zetia, metoprolol, as needed SL NTG.  Chronic diastolic heart failure Echo 03/22/2023 showed normal LV/RV function, moderate LVH, Grade I DD, aortic valve sclerosis without stenosis, negative bubble study.  Patient denies lower extremity edema, worsened shortness of breath, orthopnea or PND. She is on 3 L continuous supplemental O2. Breath sounds diminished bilaterally otherwise euvolemic and well compensated on exam. -Continue Farxiga, lisinopril, metoprolol.  Hypertension BP today 140/72. No reports of headaches or dizziness.  -Continue lisinopril, metoprolol.  Hyperlipidemia LDL February 2024 73. -Continue rosuvastatin and Zetia.  CVA 2018 and 2021.  Most recent CVA 03/17/2023 (results of MRI/MRA detailed in patient profile). Bubble study 03/22/2023 negative for interatrial shunting. Patient is scheduled to follow up with neurology next week. Patient's speech is clear. She is alert and oriented and answers questions appropriately.  -Continue aspirin, Plavix, rosuvastatin, Zetia. -Follow-up with neurology. -30 day heart monitor for further evaluation of potential causes of stroke.   Disposition: 30 day cardiac monitor. Return in 3 months or sooner as needed.  Signed, Etta Grandchild. Davius Goudeau, DNP, NP-C

## 2023-03-24 ENCOUNTER — Ambulatory Visit: Payer: Medicare Other | Attending: Student | Admitting: Student

## 2023-03-24 ENCOUNTER — Ambulatory Visit: Payer: Medicare Other | Admitting: Family Medicine

## 2023-03-24 ENCOUNTER — Encounter: Payer: Self-pay | Admitting: Cardiovascular Disease

## 2023-03-24 ENCOUNTER — Encounter: Payer: Self-pay | Admitting: Student

## 2023-03-24 ENCOUNTER — Ambulatory Visit: Payer: Medicare Other

## 2023-03-24 VITALS — BP 140/72 | HR 70 | Ht 65.0 in | Wt 147.2 lb

## 2023-03-24 DIAGNOSIS — Z8673 Personal history of transient ischemic attack (TIA), and cerebral infarction without residual deficits: Secondary | ICD-10-CM | POA: Diagnosis not present

## 2023-03-24 DIAGNOSIS — I639 Cerebral infarction, unspecified: Secondary | ICD-10-CM

## 2023-03-24 DIAGNOSIS — Z794 Long term (current) use of insulin: Secondary | ICD-10-CM | POA: Diagnosis not present

## 2023-03-24 DIAGNOSIS — E785 Hyperlipidemia, unspecified: Secondary | ICD-10-CM | POA: Insufficient documentation

## 2023-03-24 DIAGNOSIS — I10 Essential (primary) hypertension: Secondary | ICD-10-CM | POA: Diagnosis not present

## 2023-03-24 DIAGNOSIS — R42 Dizziness and giddiness: Secondary | ICD-10-CM

## 2023-03-24 DIAGNOSIS — I2510 Atherosclerotic heart disease of native coronary artery without angina pectoris: Secondary | ICD-10-CM | POA: Insufficient documentation

## 2023-03-24 DIAGNOSIS — I5032 Chronic diastolic (congestive) heart failure: Secondary | ICD-10-CM | POA: Insufficient documentation

## 2023-03-24 DIAGNOSIS — E1121 Type 2 diabetes mellitus with diabetic nephropathy: Secondary | ICD-10-CM | POA: Diagnosis not present

## 2023-03-24 DIAGNOSIS — E1169 Type 2 diabetes mellitus with other specified complication: Secondary | ICD-10-CM | POA: Diagnosis not present

## 2023-03-24 DIAGNOSIS — I152 Hypertension secondary to endocrine disorders: Secondary | ICD-10-CM | POA: Diagnosis not present

## 2023-03-24 DIAGNOSIS — E1159 Type 2 diabetes mellitus with other circulatory complications: Secondary | ICD-10-CM | POA: Diagnosis not present

## 2023-03-24 NOTE — Patient Instructions (Signed)
Medication Instructions:   Your Physician recommend you continue on your current medication as directed.     *If you need a refill on your cardiac medications before your next appointment, please call your pharmacy*   Lab Work: None ordered  If you have labs (blood work) drawn today and your tests are completely normal, you will receive your results only by: MyChart Message (if you have MyChart) OR A paper copy in the mail If you have any lab test that is abnormal or we need to change your treatment, we will call you to review the results.   Testing/Procedures: Hannah Kirby- Long Term Monitor Instructions  Your physician has requested you wear a ZIO patch monitor for 28 days.  This is a single patch monitor. [employer] supplies one patch monitor per enrollment. Additional stickers are not available. Please do not apply patch if you will be having a Nuclear Stress Test,  Echocardiogram, Cardiac CT, MRI, or Chest Xray during the period you would be wearing the  monitor. The patch cannot be worn during these tests. You cannot remove and re-apply the  ZIO XT patch monitor.  Your ZIO patch monitor will be mailed 3 day USPS to your address on file. It may take 3-5 days  to receive your monitor after you have been enrolled.  Once you have received your monitor, please review the enclosed instructions. Your monitor  has already been registered assigning a specific monitor serial # to you.  Billing and Patient Assistance Program Information  We have supplied [employer] with any of your insurance information on file for billing purposes. [employer] offers a sliding scale Patient Assistance Program for patients that do not have  insurance, or whose insurance does not completely cover the cost of the ZIO monitor.  You must apply for the Patient Assistance Program to qualify for this discounted rate.  To apply, please call [employer] at [phone], select option 4, select option 2, ask to apply for  Patient  Assistance Program. Meredeth Ide will ask your household income, and how many people  are in your household. They will quote your out-of-pocket cost based on that information.  [employer] will also be able to set up a 71-month, interest-free payment plan if needed.  Applying the monitor   Shave hair from upper left chest.  Hold abrader disc by orange tab. Rub abrader in 40 strokes over the upper left chest as  indicated in your monitor instructions.  Clean area with 4 enclosed alcohol pads. Let dry.  Apply patch as indicated in monitor instructions. Patch will be placed under collarbone on left  side of chest with arrow pointing upward.  Rub patch adhesive wings for 2 minutes. Remove white label marked "1". Remove the white  label marked "2". Rub patch adhesive wings for 2 additional minutes.  While looking in a mirror, press and release button in center of patch. A small green light will  flash 3-4 times. This will be your only indicator that the monitor has been turned on.  Do not shower for the first 24 hours. You may shower after the first 24 hours.  Press the button if you feel a symptom. You will hear a small click. Record Date, Time and  Symptom in the Patient Logbook.  When you are ready to remove the patch, follow instructions on the last 2 pages of Patient  Logbook. Stick patch monitor onto the last page of Patient Logbook.  Place Patient Logbook in the blue and white box. Use  locking tab on box and tape box closed  securely. The blue and white box has prepaid postage on it. Please place it in the mailbox as  soon as possible. Your physician should have your test results approximately 7 days after the  monitor has been mailed back to [facility].  Call [facility] Customer Care at [phone] if you have questions regarding  your ZIO XT patch monitor. Call them immediately if you see an orange light blinking on your  monitor.  If your monitor falls off in less than 4 days,  contact our Monitor department at [phone].  If your monitor becomes loose or falls off after 4 days call [employer] at [phone] for  suggestions on securing your monitor    Follow-Up: At [facility], you and your health needs are our priority.  As part of our continuing mission to provide you with exceptional heart care, we have created designated Provider Care Teams.  These Care Teams include your primary Cardiologist (physician) and Advanced Practice Providers (APPs -  Physician Assistants and Nurse Practitioners) who all work together to provide you with the care you need, when you need it.  We recommend signing up for the patient portal called "MyChart".  Sign up information is provided on this After Visit Summary.  MyChart is used to connect with patients for Virtual Visits (Telemedicine).  Patients are able to view lab/test results, encounter notes, upcoming appointments, etc.  Non-urgent messages can be sent to your provider as well.   To learn more about what you can do with MyChart, go to [email or web address].    Your next appointment:   3 month(s)  Provider:   You may see Julien Nordmann, MD or one of the following Advanced Practice Providers on your designated Care Team:   Nicolasa Ducking, NP Eula Listen, PA-C Cadence Fransico Michael, PA-C Charlsie Quest, NP Carlos Levering, NP

## 2023-03-27 ENCOUNTER — Encounter: Payer: Self-pay | Admitting: Family Medicine

## 2023-03-27 ENCOUNTER — Ambulatory Visit (INDEPENDENT_AMBULATORY_CARE_PROVIDER_SITE_OTHER): Payer: Medicare Other | Admitting: Family Medicine

## 2023-03-27 VITALS — BP 141/84 | HR 78 | Resp 16 | Ht 65.0 in | Wt 146.3 lb

## 2023-03-27 DIAGNOSIS — G4700 Insomnia, unspecified: Secondary | ICD-10-CM

## 2023-03-27 DIAGNOSIS — E1122 Type 2 diabetes mellitus with diabetic chronic kidney disease: Secondary | ICD-10-CM | POA: Diagnosis not present

## 2023-03-27 DIAGNOSIS — E7849 Other hyperlipidemia: Secondary | ICD-10-CM | POA: Diagnosis not present

## 2023-03-27 DIAGNOSIS — N1832 Chronic kidney disease, stage 3b: Secondary | ICD-10-CM | POA: Diagnosis not present

## 2023-03-27 DIAGNOSIS — I69322 Dysarthria following cerebral infarction: Secondary | ICD-10-CM | POA: Diagnosis not present

## 2023-03-27 DIAGNOSIS — Z794 Long term (current) use of insulin: Secondary | ICD-10-CM | POA: Diagnosis not present

## 2023-03-27 DIAGNOSIS — J449 Chronic obstructive pulmonary disease, unspecified: Secondary | ICD-10-CM | POA: Diagnosis not present

## 2023-03-27 DIAGNOSIS — I129 Hypertensive chronic kidney disease with stage 1 through stage 4 chronic kidney disease, or unspecified chronic kidney disease: Secondary | ICD-10-CM | POA: Diagnosis not present

## 2023-03-27 DIAGNOSIS — I69398 Other sequelae of cerebral infarction: Secondary | ICD-10-CM | POA: Diagnosis not present

## 2023-03-27 DIAGNOSIS — M6281 Muscle weakness (generalized): Secondary | ICD-10-CM | POA: Diagnosis not present

## 2023-03-27 MED ORDER — MIRTAZAPINE 15 MG PO TABS: 15.0000 mg | ORAL_TABLET | Freq: Every day | ORAL | 1 refills | Status: DC

## 2023-03-27 NOTE — Patient Instructions (Signed)
 Marland Kitchen  Please review the attached list of medications and notify my office if there are any errors.   . Please bring all of your medications to every appointment so we can make sure that our medication list is the same as yours.

## 2023-03-27 NOTE — Progress Notes (Signed)
Established patient visit   Patient: Hannah Kirby   DOB: 1945-03-28   78 y.o. Female  MRN: [id number] Visit Date: 03/27/2023  Today's healthcare provider: Mila Merry, MD   Chief Complaint  Patient presents with   Medical Management of Chronic Issues    Depression and Anxiety Possible stroke a week ago   Subjective    Discussed the use of AI scribe software for clinical note transcription with the patient, who gave verbal consent to proceed.  History of Present Illness   The patient presents with for overnight hospitalization for speech difficulties due to CVA a recent stroke. Symptoms were initially identified by her daughter due to speech difficulties. She has not yet started speech therapy and continues to have trouble communicating. She had already been taking rosuvastatin, semaglutide, aspirin and Plavix prior to the stroke, she has since had follow up with her cardiologist and put on a 28-day monitor to check for atrial fibrillation. Her cholesterol levels and blood pressure have been well controlled.    She was prescribed mirtazapine by hospitalist for sleep and anxiety issues. She reports only some improvement in sleep quality, experiencing restlessness and symptoms of restless legs. . No trouble breathing, chest pains, or heart flutters.     Lab Results  Component Value Date   HGBA1C 7.1 (H) 03/17/2023   Lab Results  Component Value Date   CHOL 160 04/08/2022   HDL 62 04/08/2022   LDLCALC 73 04/08/2022   TRIG 148 04/08/2022   CHOLHDL 2.6 04/08/2022   Lab Results  Component Value Date   WBC 7.6 03/17/2023   HGB 17.7 (H) 03/17/2023   HCT 52.7 (H) 03/17/2023   MCV 96.0 03/17/2023   PLT 201 03/17/2023   Lab Results  Component Value Date   NA 142 03/17/2023   CL 105 03/17/2023   K 3.7 03/17/2023   CO2 25 03/17/2023   BUN 17 03/17/2023   CREATININE 1.41 (H) 03/17/2023   GFRNONAA 39 (L) 03/17/2023   CALCIUM 9.7 03/17/2023   PHOS 3.7 08/24/2022    ALBUMIN 3.4 (L) 03/17/2023   GLUCOSE 112 (H) 03/17/2023   Lab Results  Component Value Date   VD25OH 43.6 04/12/2021     Medications: Outpatient Medications Prior to Visit  Medication Sig   acetaminophen (TYLENOL) 500 MG tablet Take 500 mg by mouth every 6 (six) hours as needed.   albuterol (VENTOLIN HFA) 108 (90 Base) MCG/ACT inhaler Inhale 2 puffs into the lungs every 4 (four) hours as needed for wheezing or shortness of breath.   aspirin (ASPIR-81) 81 MG EC tablet Take 81 mg by mouth daily.   calcium carbonate (TUMS - DOSED IN MG ELEMENTAL CALCIUM) 500 MG chewable tablet Chew 1 tablet by mouth daily as needed.   clopidogrel (PLAVIX) 75 MG tablet Take 1 tablet (75 mg total) by mouth daily.   cyanocobalamin 1000 MCG tablet Take 1,000 mcg by mouth daily.   dapagliflozin propanediol (FARXIGA) 5 MG TABS tablet Take 1 tablet (5 mg total) by mouth daily before breakfast. (Patient taking differently: Take 2.5 mg by mouth 2 (two) times daily.)   Docusate Sodium (DSS) 100 MG CAPS Take 1 capsule by mouth daily.   ezetimibe (ZETIA) 10 MG tablet Take 1 tablet (10 mg total) by mouth daily.   Fluticasone-Umeclidin-Vilant (TRELEGY ELLIPTA) 100-62.5-25 MCG/ACT AEPB Inhale 1 puff into the lungs daily.   HYDROcodone-acetaminophen (NORCO) 7.5-325 MG tablet Take 1 tablet by mouth every 6 (six) hours as needed  for moderate pain (pain score 4-6).   insulin glargine (LANTUS) 100 UNIT/ML injection Inject 22 Units into the skin daily.   lisinopril (ZESTRIL) 20 MG tablet Take 1 tablet (20 mg total) by mouth daily.   meclizine (ANTIVERT) 25 MG tablet Take 25 mg by mouth 3 (three) times daily as needed.   metoprolol tartrate (LOPRESSOR) 100 MG tablet Take 1 tablet (100 mg total) by mouth 2 (two) times daily.   Multiple Vitamins-Minerals (DAILY MULTI) TABS Take 1 tablet by mouth daily.   nitroGLYCERIN (NITROSTAT) 0.4 MG SL tablet Place 1 tablet (0.4 mg total) under the tongue every 5 (five) minutes as needed.    Omega-3 Fatty Acids (FISH OIL) 1000 MG CAPS Take 2 capsules by mouth in the morning and at bedtime.    ondansetron (ZOFRAN ODT) 4 MG disintegrating tablet Take 1 tablet (4 mg total) by mouth every 8 (eight) hours as needed for nausea or vomiting.   RABEprazole (ACIPHEX) 20 MG tablet Take 1 tablet (20 mg total) by mouth daily. 15 Mins. before evening meal   rosuvastatin (CRESTOR) 20 MG tablet Take 1 tablet (20 mg total) by mouth daily.   Semaglutide,0.25 or 0.5MG /DOS, 2 MG/1.5ML SOPN Inject 0.5 mg into the skin once a week.   sucralfate (CARAFATE) 1 g tablet Take 1 tablet (1 g total) by mouth 2 (two) times daily. 15 Minutes before evening meal and at bed time   mirtazapine (REMERON) 7.5 MG tablet Take 1 tablet (7.5 mg total) by mouth at bedtime.   No facility-administered medications prior to visit.   Review of Systems     Objective    BP (!) 141/84 (BP Location: Left Arm, Patient Position: Sitting, Cuff Size: Normal)   Pulse 78   Resp 16   Ht 5\' 5"  (1.651 m)   Wt 146 lb 4.8 oz (66.4 kg)   SpO2 97%   BMI 24.35 kg/m   Physical Exam   General: Appearance:    Well developed, well nourished female in no acute distress  Eyes:    PERRL, conjunctiva/corneas clear, EOM's intact       Lungs:     Clear to auscultation bilaterally, respirations unlabored  Heart:    Normal heart rate. Normal rhythm. No murmurs, rubs, or gallops.    MS:   All extremities are intact.    Neurologic:   Awake, alert, oriented x 3. 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 No current respiratory or cardiac symptoms. On aspirin and Plavix at the time of the stroke. -Continue aspirin and Plavix. -Wear a 28-day heart monitor per cardiology to assess for atrial fibrillation.  Insomnia/Depression Difficulty sleeping and restlessness. Currently on  mirtazapine 7.5mg  with some improvement in sleep. -Increase mirtazapine to 15mg  nightly. -Reevaluate for possible addition of anxiolytic medication after assessing response to increased mirtazapine dose.   check lipids, vitamin D and renal panel     Type 2 diabetes. Fairly well controlled.doing well current medications. Continue regular follow up Dr. Darlyne Russian, MD  [facility] [phone] (phone) [phone] (fax)  [facility]

## 2023-03-28 ENCOUNTER — Telehealth: Payer: Self-pay | Admitting: Family Medicine

## 2023-03-28 ENCOUNTER — Ambulatory Visit: Payer: Medicare Other

## 2023-03-28 DIAGNOSIS — I69398 Other sequelae of cerebral infarction: Secondary | ICD-10-CM | POA: Diagnosis not present

## 2023-03-28 DIAGNOSIS — I129 Hypertensive chronic kidney disease with stage 1 through stage 4 chronic kidney disease, or unspecified chronic kidney disease: Secondary | ICD-10-CM | POA: Diagnosis not present

## 2023-03-28 DIAGNOSIS — I69322 Dysarthria following cerebral infarction: Secondary | ICD-10-CM | POA: Diagnosis not present

## 2023-03-28 DIAGNOSIS — Z0000 Encounter for general adult medical examination without abnormal findings: Secondary | ICD-10-CM

## 2023-03-28 DIAGNOSIS — J449 Chronic obstructive pulmonary disease, unspecified: Secondary | ICD-10-CM | POA: Diagnosis not present

## 2023-03-28 DIAGNOSIS — M6281 Muscle weakness (generalized): Secondary | ICD-10-CM | POA: Diagnosis not present

## 2023-03-28 DIAGNOSIS — E1122 Type 2 diabetes mellitus with diabetic chronic kidney disease: Secondary | ICD-10-CM | POA: Diagnosis not present

## 2023-03-28 LAB — RENAL FUNCTION PANEL
Albumin: 3.8 g/dL (ref 3.8–4.8)
BUN/Creatinine Ratio: 18 (ref 12–28)
BUN: 28 mg/dL — ABNORMAL HIGH (ref 8–27)
CO2: 26 mmol/L (ref 20–29)
Calcium: 10 mg/dL (ref 8.7–10.3)
Chloride: 106 mmol/L (ref 96–106)
Creatinine, Ser: 1.56 mg/dL — ABNORMAL HIGH (ref 0.57–1.00)
Glucose: 190 mg/dL — ABNORMAL HIGH (ref 70–99)
Phosphorus: 3.4 mg/dL (ref 3.0–4.3)
Potassium: 4.2 mmol/L (ref 3.5–5.2)
Sodium: 145 mmol/L — ABNORMAL HIGH (ref 134–144)
eGFR: 34 mL/min/{1.73_m2} — ABNORMAL LOW (ref >59)

## 2023-03-28 LAB — LIPID PANEL
Chol/HDL Ratio: 3.5 {ratio} (ref 0.0–4.4)
Cholesterol, Total: 152 mg/dL (ref 100–199)
HDL: 43 mg/dL (ref >39)
LDL Chol Calc (NIH): 72 mg/dL (ref 0–99)
Triglycerides: 222 mg/dL — ABNORMAL HIGH (ref 0–149)
VLDL Cholesterol Cal: 37 mg/dL (ref 5–40)

## 2023-03-28 LAB — VITAMIN D 25 HYDROXY (VIT D DEFICIENCY, FRACTURES): Vit D, 25-Hydroxy: 44 ng/mL (ref 30.0–100.0)

## 2023-03-28 LAB — LDL CHOLESTEROL, DIRECT: LDL Direct: 70 mg/dL (ref 0–99)

## 2023-03-28 NOTE — Patient Instructions (Addendum)
 [name] , Thank you for taking time to come for your Medicare Wellness Visit. I appreciate your ongoing commitment to your health goals. Please review the following plan we discussed and let me know if I can assist you in the future.   Referrals/Orders/Follow-Ups/Clinician Recommendations: NONE  This is a list of the screening recommended for you and due dates:  Health Maintenance  Topic Date Due   Medicare Annual Wellness Visit  03/10/2023   COVID-19 Vaccine (5 - 2024-25 season) 03/29/2023*   Zoster (Shingles) Vaccine (1 of 2) 06/11/2023*   Screening for Lung Cancer  03/12/2024*   DTaP/Tdap/Td vaccine (1 - Tdap) 03/12/2024*   DEXA scan (bone density measurement)  03/12/2024*   Eye exam for diabetics  05/09/2023   Yearly kidney health urinalysis for diabetes  08/24/2023   Hemoglobin A1C  09/14/2023   Yearly kidney function blood test for diabetes  03/26/2024   Colon Cancer Screening  11/18/2024   Pneumonia Vaccine  Completed   Flu Shot  Completed   Hepatitis C Screening  Completed   HPV Vaccine  Aged Out  *Topic was postponed. The date shown is not the original due date.    Advanced directives: (ACP Link)Information on Advanced Care Planning can be found at [facility]  Secretary of [facility] Advance Health Care Directives Advance Health Care Directives ([email or web address]   Next Medicare Annual Wellness Visit scheduled for next year: Yes    04/02/24 @ 1:50 PM BY VIDEO

## 2023-03-28 NOTE — Progress Notes (Signed)
 Subjective:   Hannah Kirby is a 78 y.o. female who presents for Medicare Annual (Subsequent) preventive examination.  Visit Complete: Virtual I connected with  Hannah Kirby on 03/28/23 by a video and audio enabled telemedicine application and verified that I am speaking with the correct person using two identifiers.  Patient Location: Home  Provider Location: Office/Clinic  I discussed the limitations of evaluation and management by telemedicine. The patient expressed understanding and agreed to proceed.  Vital Signs: Because this visit was a virtual/telehealth visit, some criteria may be missing or patient reported. Any vitals not documented were not able to be obtained and vitals that have been documented are patient reported.  Cardiac Risk Factors include: advanced age (>10men, >98 women);diabetes mellitus;dyslipidemia;hypertension;sedentary lifestyle     Objective:    Today's Vitals   03/28/23 1313  PainSc: 4    There is no height or weight on file to calculate BMI.     03/28/2023    1:21 PM 03/17/2023    1:40 PM 03/09/2022    1:15 PM 03/08/2021    1:16 PM 03/24/2020    5:47 PM 03/23/2020    5:58 PM 02/18/2020    2:34 PM  Advanced Directives  Does Patient Have a Medical Advance Directive? No No No No  No Yes  Type of Tax Inspector;Living will  Copy of Healthcare Power of Attorney in Chart?       No - copy requested  Would patient like information on creating a medical advance directive? No - Patient declined  No - Patient declined Yes (ED - Information included in AVS) No - Patient declined  No - Patient declined    Current Medications (verified) Outpatient Encounter Medications as of 03/28/2023  Medication Sig   acetaminophen  (TYLENOL ) 500 MG tablet Take 500 mg by mouth every 6 (six) hours as needed.   albuterol  (VENTOLIN  HFA) 108 (90 Base) MCG/ACT inhaler Inhale 2 puffs into the lungs every 4 (four) hours as needed for  wheezing or shortness of breath.   aspirin  (ASPIR-81) 81 MG EC tablet Take 81 mg by mouth daily.   calcium  carbonate (TUMS - DOSED IN MG ELEMENTAL CALCIUM ) 500 MG chewable tablet Chew 1 tablet by mouth daily as needed.   clopidogrel  (PLAVIX ) 75 MG tablet Take 1 tablet (75 mg total) by mouth daily.   cyanocobalamin  1000 MCG tablet Take 1,000 mcg by mouth daily.   dapagliflozin  propanediol (FARXIGA ) 5 MG TABS tablet Take 1 tablet (5 mg total) by mouth daily before breakfast. (Patient taking differently: Take 2.5 mg by mouth 2 (two) times daily.)   Docusate Sodium  (DSS) 100 MG CAPS Take 1 capsule by mouth daily.   ezetimibe  (ZETIA ) 10 MG tablet Take 1 tablet (10 mg total) by mouth daily.   Fluticasone -Umeclidin-Vilant (TRELEGY ELLIPTA ) 100-62.5-25 MCG/ACT AEPB Inhale 1 puff into the lungs daily.   HYDROcodone -acetaminophen  (NORCO) 7.5-325 MG tablet Take 1 tablet by mouth every 6 (six) hours as needed for moderate pain (pain score 4-6).   insulin  glargine (LANTUS ) 100 UNIT/ML injection Inject 22 Units into the skin daily.   lisinopril  (ZESTRIL ) 20 MG tablet Take 1 tablet (20 mg total) by mouth daily.   meclizine  (ANTIVERT ) 25 MG tablet Take 25 mg by mouth 3 (three) times daily as needed.   metoprolol  tartrate (LOPRESSOR ) 100 MG tablet Take 1 tablet (100 mg total) by mouth 2 (two) times daily.   mirtazapine  (REMERON ) 15 MG tablet  Take 1 tablet (15 mg total) by mouth at bedtime.   Multiple Vitamins-Minerals (DAILY MULTI) TABS Take 1 tablet by mouth daily.   nitroGLYCERIN  (NITROSTAT ) 0.4 MG SL tablet Place 1 tablet (0.4 mg total) under the tongue every 5 (five) minutes as needed.   Omega-3 Fatty Acids (FISH OIL ) 1000 MG CAPS Take 2 capsules by mouth in the morning and at bedtime.    ondansetron  (ZOFRAN  ODT) 4 MG disintegrating tablet Take 1 tablet (4 mg total) by mouth every 8 (eight) hours as needed for nausea or vomiting.   RABEprazole  (ACIPHEX ) 20 MG tablet Take 1 tablet (20 mg total) by mouth daily.  15 Mins. before evening meal   rosuvastatin  (CRESTOR ) 20 MG tablet Take 1 tablet (20 mg total) by mouth daily.   Semaglutide,0.25 or 0.5MG /DOS, 2 MG/1.5ML SOPN Inject 0.5 mg into the skin once a week.   sucralfate  (CARAFATE ) 1 g tablet Take 1 tablet (1 g total) by mouth 2 (two) times daily. 15 Minutes before evening meal and at bed time   No facility-administered encounter medications on file as of 03/28/2023.    Allergies (verified) Spironolactone , Sulfa antibiotics, Sulfonamide derivatives, Other, Codeine, and Prednisone    History: Past Medical History:  Diagnosis Date   Acute hemorrhagic colitis 08/08/2019   Anemia    Anxiety    Arthritis    C. difficile colitis 08/09/2019   CAD (coronary artery disease)    a. 02/2006 PCI: BMS x 2 to RCA, cath o/w without significant coronary disease; b. nuclear stress test 07/2014: No ischemia/infarct; c. 11/2017 MV: no isch/infarct, EF 55-65%; d. 03/2020 NSTEMI/PCI: LM nl, LAD min irregs, RI 25, small, LCX nl, RCA 30p/m ISR, 99d (3.0x15 Resolute Onyx DES).   Cerebrovascular accident (CVA) (HCC) 03/18/2023   dysarthria   Chronic bronchitis (HCC)    secondary to cigarette smoking   Chronic kidney disease (CKD), stage III (moderate) (HCC)    COPD (chronic obstructive pulmonary disease) (HCC)    Diabetes mellitus    Diastolic dysfunction    a. 07/2019 Echo: EF 60-65%, no rwma, mod LVH. Nl RV size/fxn; b. 03/2020 Echo: EF 60-65%, mod LVH, gr1 DD, nl RV size/fxn, mild AS.   FHx: allergies    GERD (gastroesophageal reflux disease)    Goiter    Granulomatous disease (HCC)    Hernia    Hyperlipidemia    Hypertension    Kidney stone on left side 2013   Microalbuminuria    NSTEMI (non-ST elevated myocardial infarction) (HCC) 03/23/2020   Obesity    Panic attacks    PVC's (premature ventricular contractions)    a. 03/2018 Zio: Occas PVCs (2.5%). Triggered events assoc w/ PVC/PAC.   Retinal tear 2020   Smokers' cough (HCC)    Spinal stenosis    Stroke  (HCC) 10/29/2016   mild left side weakness   Stroke (HCC) 08/01/2019   Tobacco abuse    Past Surgical History:  Procedure Laterality Date   ABDOMINAL HYSTERECTOMY     BREAST SURGERY     CATARACT EXTRACTION W/PHACO Right 03/01/2017   Procedure: CATARACT EXTRACTION PHACO AND INTRAOCULAR LENS PLACEMENT (IOC) RIGHT DIABETIC;  Surgeon: Mittie Gaskin, MD;  Location: [facility];  Service: Ophthalmology;  Laterality: Right;   CATARACT EXTRACTION W/PHACO Left 03/22/2017   Procedure: CATARACT EXTRACTION PHACO AND INTRAOCULAR LENS PLACEMENT (IOC) LEFT DIABETIC;  Surgeon: Mittie Gaskin, MD;  Location: [facility];  Service: Ophthalmology;  Laterality: Left;  Diabetic - insulin  and oral meds   COLONOSCOPY WITH  PROPOFOL  N/A 09/23/2014   Procedure: COLONOSCOPY WITH PROPOFOL ;  Surgeon: Gladis RAYMOND Mariner, MD;  Location: [facility] END[facility]COPY;  Service: Endoscopy;  Laterality: N/A;   COLONOSCOPY WITH PROPOFOL  N/A 01/11/2018   Procedure: COLONOSCOPY WITH PROPOFOL ;  Surgeon: Mariner Gladis RAYMOND, MD;  Location: [facility] ENDOSCOPY;  Service: Endoscopy;  Laterality: N/A;   COLONOSCOPY WITH PROPOFOL  N/A 04/24/2018   Procedure: COLONOSCOPY WITH PROPOFOL ;  Surgeon: Mariner Gladis RAYMOND, MD;  Location: [facility] ENDOSCOPY;  Service: Endoscopy;  Laterality: N/A;   COLONOSCOPY WITH PROPOFOL  N/A 11/19/2019   Procedure: COLONOSCOPY WITH PROPOFOL ;  Surgeon: Jinny Carmine, MD;  Location: [facility] ENDOSCOPY;  Service: Endoscopy;  Laterality: N/A;   CORONARY ANGIOPLASTY WITH STENT PLACEMENT  2008   CORONARY STENT INTERVENTION N/A 03/24/2020   Procedure: CORONARY STENT INTERVENTION;  Surgeon: Mady Bruckner, MD;  Location: [facility] INVASIVE CV LAB;  Service: Cardiovascular;  Laterality: N/A;   ESOPHAGOGASTRODUODENOSCOPY (EGD) WITH PROPOFOL  N/A 12/30/2014   Procedure: ESOPHAGOGASTRODUODENOSCOPY (EGD) WITH PROPOFOL ;  Surgeon: Gladis RAYMOND Mariner, MD;  Location[facility] END[facility]COPY;  Service: Endoscopy;  Laterality: N/A;    ESOPHAGOGASTRODUODENOSCOPY (EGD) WITH PROPOFOL  N/A 07/19/2016   Procedure: ESOPHAGOGASTRODUODENOSCOPY (EGD) WITH PROPOFOL ;  Surgeon: Mariner Gladis RAYMOND, MD;  Location: [facility] ENDOSCOPY;  Service: Endoscopy;  Laterality: N/A;   ESOPHAGOGASTRODUODENOSCOPY (EGD) WITH PROPOFOL  N/A 04/24/2018   Procedure: ESOPHAGOGASTRODUODENOSCOPY (EGD) WITH PROPOFOL ;  Surgeon: Mariner Gladis RAYMOND, MD;  Location: [facility] ENDOSCOPY;  Service: Endoscopy;  Laterality: N/A;   hysterectomy (other)     LEFT HEART CATH AND CORONARY ANGIOGRAPHY N/A 03/24/2020   Procedure: LEFT HEART CATH AND CORONARY ANGIOGRAPHY;  Surgeon: Mady Bruckner, MD;  Location: [facility] INVASIVE CV LAB;  Service: Cardiovascular;  Laterality: N/A;   Family History  Problem Relation Age of Onset   Heart failure Mother    Stroke Mother    Diabetes Mother    Congestive Heart Failure Mother    Lung cancer Father    Breast cancer Maternal Aunt    Social History   Socioeconomic History   Marital status: Married    Spouse name: Yazmen Briones   Number of children: 3   Years of education: Not on file   Highest education level: 12th grade  Occupational History   Occupation: retired  Tobacco Use   Smoking status: Every Day    Current packs/day: 1.00    Average packs/day: 1 pack/day for 40.0 years (40.0 ttl pk-yrs)    Types: Cigarettes   Smokeless tobacco: Never  Vaping Use   Vaping status: Former  Substance and Sexual Activity   Alcohol use: Yes    Comment: 1 glass of wine every few months on special occassions   Drug use: No   Sexual activity: Not on file  Other Topics Concern   Not on file  Social History Narrative   Retired. Married.  Lives in [place] with her husband.  She is not routinely exercising.   Social Drivers of Corporate Investment Banker Strain: Low Risk  (03/28/2023)   Overall Financial Resource Strain (CARDIA)    Difficulty of Paying Living Expenses: Not hard at all  Food Insecurity: No Food Insecurity (03/28/2023)   Hunger  Vital Sign    Worried About Running Out of Food in the Last Year: Never true    Ran Out of Food in the Last Year: Never true  Transportation Needs: No Transportation Needs (03/28/2023)   PRAPARE - Administrator, Civil Service (Medical): No    Lack of Transportation (Non-Medical): No  Physical Activity: Inactive (03/28/2023)  Exercise Vital Sign    Days of Exercise per Week: 0 days    Minutes of Exercise per Session: 0 min  Stress: Stress Concern Present (03/28/2023)   [employer] of Occupational Health - Occupational Stress Questionnaire    Feeling of Stress : To some extent  Social Connections: Moderately Isolated (03/28/2023)   Social Connection and Isolation Panel [NHANES]    Frequency of Communication with Friends and Family: More than three times a week    Frequency of Social Gatherings with Friends and Family: More than three times a week    Attends Religious Services: Never    Database Administrator or Organizations: No    Attends Engineer, Structural: Never    Marital Status: Married    Tobacco Counseling Ready to quit: Not Answered Counseling given: Not Answered   Clinical Intake:  Pre-visit preparation completed: Yes  Pain : 0-10 Pain Score: 4  Pain Type: Chronic pain Pain Location: Back Pain Orientation: Lower, Left Pain Radiating Towards: hip,leg Pain Descriptors / Indicators: Aching, Shooting, Constant Pain Onset: More than a month ago Pain Frequency: Intermittent     BMI - recorded: 24.3 Nutritional Status: BMI of 19-24  Normal Nutritional Risks: None Diabetes: Yes CBG done?: No Did pt. bring in CBG monitor from home?: No  How often do you need to have someone help you when you read instructions, pamphlets, or other written materials from your doctor or pharmacy?: 1 - Never  Interpreter Needed?: No  Information entered by :: JHONNIE DAS, LPN   Activities of Daily Living    03/28/2023    1:23 PM 05/18/2022   11:36 AM  In  your present state of health, do you have any difficulty performing the following activities:  Hearing? 0 0  Vision? 0 0  Difficulty concentrating or making decisions? 1 0  Comment JUST HAD STROKE RECENTLY   Walking or climbing stairs? 1 0  Dressing or bathing? 0 1  Doing errands, shopping? 0 0  Preparing Food and eating ? N   Using the Toilet? N   In the past six months, have you accidently leaked urine? Y   Do you have problems with loss of bowel control? N   Managing your Medications? N   Managing your Finances? N   Housekeeping or managing your Housekeeping? Y     Patient Care Team: Gasper [name] BRAVO, MD as PCP - General (Family Medicine) Perla Evalene PARAS, MD as PCP - Cardiology (Cardiology) Perla Evalene PARAS, MD as Consulting Physician (Cardiology) Dingeldein, Elspeth, MD as Consulting Physician (Ophthalmology) Douglas Rule, MD as Consulting Physician (Nephrology) Marea Selinda RAMAN, MD as Referring Physician (Vascular Surgery) Cherilyn Debby CROME, MD as Consulting Physician (Endocrinology) Jinny Carmine, MD as Consulting Physician (Gastroenterology) Alana, Sharyle LABOR, RPH-CPP as Pharmacist Karoline Lima, RN as Case Manager  Indicate any recent Medical Services you may have received from other than [facility] providers in the past year (date may be approximate).     Assessment:   This is a routine wellness examination for [place].  Hearing/Vision screen Hearing Screening - Comments:: NO AIDS Vision Screening - Comments:: READERS- [facility]   Goals Addressed             This Visit's Progress    DIET - INCREASE WATER INTAKE         Depression Screen    03/28/2023    1:20 PM 03/13/2023   10:38 AM 02/20/2023    1:05 PM 09/09/2022  9:50 AM 05/18/2022   11:35 AM 05/02/2022    1:12 PM 03/09/2022    1:13 PM  PHQ 2/9 Scores  PHQ - 2 Score 2 0 0 1 1 1 1   PHQ- 9 Score 2 0 2 6 6  4     Fall Risk    03/28/2023    1:23 PM 03/13/2023   10:26 AM 02/20/2023    1:09 PM  09/09/2022    9:50 AM 05/18/2022   11:35 AM  Fall Risk   Falls in the past year? 1 1 1 1 1   Number falls in past yr: 1 1 1 1  0  Injury with Fall? 0 1 0 1 0  Risk for fall due to : History of fall(s);Impaired balance/gait Impaired balance/gait  History of fall(s);Impaired balance/gait;Orthopedic patient Impaired balance/gait  Follow up Falls evaluation completed;Falls prevention discussed Falls prevention discussed;Education provided;Falls evaluation completed  Education provided;Falls prevention discussed;Falls evaluation completed Falls evaluation completed    MEDICARE RISK AT HOME: Medicare Risk at Home Any stairs in or around the home?: Yes If so, are there any without handrails?: No Home free of loose throw rugs in walkways, pet beds, electrical cords, etc?: Yes Adequate lighting in your home to reduce risk of falls?: Yes Life alert?: No Use of a cane, walker or w/c?: Yes (CANE SOMETIMES) Grab bars in the bathroom?: Yes Shower chair or bench in shower?: Yes Elevated toilet seat or a handicapped toilet?: Yes  TIMED UP AND GO:  Was the test performed?  No    Cognitive Function:        03/28/2023    1:25 PM 03/09/2022    1:23 PM 08/03/2016   11:03 AM  6CIT Screen  What Year? 4 points 0 points 0 points  What month? 0 points 0 points 0 points  What time? 3 points 0 points 0 points  Count back from 20 4 points 0 points 0 points  Months in reverse 4 points 0 points 0 points  Repeat phrase 10 points 0 points 0 points  Total Score 25 points 0 points 0 points    Immunizations Immunization History  Administered Date(s) Administered   Fluad Quad(high Dose 65+) 11/20/2019, 11/25/2020, 11/16/2021   Fluad Trivalent(High Dose 65+) 11/25/2022   Influenza, High Dose Seasonal PF 11/01/2014, 11/24/2015, 11/08/2016, 11/22/2017   Influenza-Unspecified 10/22/2013   PFIZER Comirnaty(Gray Top)Covid-19 Tri-Sucrose Vaccine 08/04/2020   PFIZER(Purple Top)SARS-COV-2 Vaccination 04/02/2019,  04/23/2019, 01/02/2020   Pneumococcal Conjugate-13 05/22/2013   Pneumococcal Polysaccharide-23 11/02/2011   Zoster, Live 04/23/2012    TDAP status: Due, Education has been provided regarding the importance of this vaccine. Advised may receive this vaccine at local pharmacy or Health Dept. Aware to provide a copy of the vaccination record if obtained from local pharmacy or Health Dept. Verbalized acceptance and understanding.  Flu Vaccine status: Up to date  Pneumococcal vaccine status: Declined,  Education has been provided regarding the importance of this vaccine but patient still declined. Advised may receive this vaccine at local pharmacy or Health Dept. Aware to provide a copy of the vaccination record if obtained from local pharmacy or Health Dept. Verbalized acceptance and understanding.   Covid-19 vaccine status: Completed vaccines  Qualifies for Shingles Vaccine? Yes   Zostavax completed Yes   Shingrix Completed?: No.    Education has been provided regarding the importance of this vaccine. Patient has been advised to call insurance company to determine out of pocket expense if they have not yet received this vaccine. Advised may also  receive vaccine at local pharmacy or Health Dept. Verbalized acceptance and understanding.  Screening Tests Health Maintenance  Topic Date Due   Medicare Annual Wellness (AWV)  03/10/2023   COVID-19 Vaccine (5 - 2024-25 season) 03/29/2023 (Originally 10/23/2022)   Zoster Vaccines- Shingrix (1 of 2) 06/11/2023 (Originally 07/12/1996)   Lung Cancer Screening  03/12/2024 (Originally 08/08/2017)   DTaP/Tdap/Td (1 - Tdap) 03/12/2024 (Originally 07/12/1964)   DEXA SCAN  03/12/2024 (Originally 08/27/2022)   OPHTHALMOLOGY EXAM  05/09/2023   Diabetic kidney evaluation - Urine ACR  08/24/2023   HEMOGLOBIN A1C  09/14/2023   Diabetic kidney evaluation - eGFR measurement  03/26/2024   Colonoscopy  11/18/2024   Pneumonia Vaccine 27+ Years old  Completed   INFLUENZA  VACCINE  Completed   Hepatitis C Screening  Completed   HPV VACCINES  Aged Out    Health Maintenance  Health Maintenance Due  Topic Date Due   Medicare Annual Wellness (AWV)  03/10/2023    Colorectal cancer screening: No longer required.   Mammogram status: No longer required due to AGE.  Bone Density status: Completed 08/26/20. Results reflect: Bone density results: OSTEOPENIA. Repeat every 5 years.  Lung Cancer Screening: (Low Dose CT Chest recommended if Age 51-80 years, 20 pack-year currently smoking OR have quit w/in 15years.) does qualify.   Lung Cancer Screening Referral: CT SCAN SCHEDULED FOR 04/05/23  Additional Screening:  Hepatitis C Screening: does qualify; Completed 07/27/15  Vision Screening: Recommended annual ophthalmology exams for early detection of glaucoma and other disorders of the eye. Is the patient up to date with their annual eye exam?  Yes  Who is the provider or what is the name of the office in which the patient attends annual eye exams? [facility] If pt is not established with a provider, would they like to be referred to a provider to establish care? No .   Dental Screening: Recommended annual dental exams for proper oral hygiene  Diabetic Foot Exam: Diabetic Foot Exam: Overdue, Pt has been advised about the importance in completing this exam. Pt is scheduled for diabetic foot exam on 00.  Community Resource Referral / Chronic Care Management: CRR required this visit?  No   CCM required this visit?  No     Plan:     I have personally reviewed and noted the following in the patient's chart:   Medical and social history Use of alcohol, tobacco or illicit drugs  Current medications and supplements including opioid prescriptions. Patient is currently taking opioid prescriptions. Information provided to patient regarding non-opioid alternatives. Patient advised to discuss non-opioid treatment plan with their provider. Functional ability and  status Nutritional status Physical activity Advanced directives List of other physicians Hospitalizations, surgeries, and ER visits in previous 12 months Vitals Screenings to include cognitive, depression, and falls Referrals and appointments  In addition, I have reviewed and discussed with patient certain preventive protocols, quality metrics, and best practice recommendations. A written personalized care plan for preventive services as well as general preventive health recommendations were provided to patient.     Jhonnie GORMAN Das, LPN   08/25/7972   After Visit Summary: (MyChart) Due to this being a telephonic visit, the after visit summary with patients personalized plan was offered to patient via MyChart   Nurse Notes: NONE

## 2023-03-28 NOTE — Telephone Encounter (Signed)
Noreene Larsson PTA with [facility] is calling to get clarification for pt oxygen. Pt daughter states that the oxygen prescription should be 3 Liters but Noreene Larsson has that it should be 2 Liters. Please call Noreene Larsson back.   Noreene Larsson phone number: [phone]

## 2023-03-28 NOTE — Telephone Encounter (Signed)
It's 2 lpm

## 2023-03-29 NOTE — Telephone Encounter (Signed)
 Advised per Dr.Fisher. Hannah Kirby verbalized understanding

## 2023-03-30 ENCOUNTER — Other Ambulatory Visit: Payer: Self-pay | Admitting: Family Medicine

## 2023-03-30 ENCOUNTER — Encounter: Payer: Self-pay | Admitting: Family Medicine

## 2023-03-30 DIAGNOSIS — Z720 Tobacco use: Secondary | ICD-10-CM | POA: Diagnosis not present

## 2023-03-30 DIAGNOSIS — Z8673 Personal history of transient ischemic attack (TIA), and cerebral infarction without residual deficits: Secondary | ICD-10-CM | POA: Diagnosis not present

## 2023-03-30 DIAGNOSIS — E7849 Other hyperlipidemia: Secondary | ICD-10-CM

## 2023-03-30 DIAGNOSIS — R479 Unspecified speech disturbances: Secondary | ICD-10-CM | POA: Diagnosis not present

## 2023-03-30 DIAGNOSIS — G459 Transient cerebral ischemic attack, unspecified: Secondary | ICD-10-CM | POA: Diagnosis not present

## 2023-03-30 DIAGNOSIS — R270 Ataxia, unspecified: Secondary | ICD-10-CM | POA: Diagnosis not present

## 2023-03-30 DIAGNOSIS — R410 Disorientation, unspecified: Secondary | ICD-10-CM | POA: Diagnosis not present

## 2023-03-30 DIAGNOSIS — R200 Anesthesia of skin: Secondary | ICD-10-CM | POA: Diagnosis not present

## 2023-03-30 DIAGNOSIS — R202 Paresthesia of skin: Secondary | ICD-10-CM | POA: Diagnosis not present

## 2023-03-30 DIAGNOSIS — R519 Headache, unspecified: Secondary | ICD-10-CM | POA: Diagnosis not present

## 2023-03-30 DIAGNOSIS — R531 Weakness: Secondary | ICD-10-CM | POA: Diagnosis not present

## 2023-03-30 DIAGNOSIS — R413 Other amnesia: Secondary | ICD-10-CM | POA: Diagnosis not present

## 2023-03-30 MED ORDER — ROSUVASTATIN CALCIUM 40 MG PO TABS: 40.0000 mg | ORAL_TABLET | Freq: Every day | ORAL | 2 refills | Status: DC

## 2023-04-03 ENCOUNTER — Ambulatory Visit (INDEPENDENT_AMBULATORY_CARE_PROVIDER_SITE_OTHER): Payer: Medicare Other | Admitting: Acute Care

## 2023-04-03 DIAGNOSIS — J449 Chronic obstructive pulmonary disease, unspecified: Secondary | ICD-10-CM | POA: Diagnosis not present

## 2023-04-03 DIAGNOSIS — M6281 Muscle weakness (generalized): Secondary | ICD-10-CM | POA: Diagnosis not present

## 2023-04-03 DIAGNOSIS — I69322 Dysarthria following cerebral infarction: Secondary | ICD-10-CM | POA: Diagnosis not present

## 2023-04-03 DIAGNOSIS — F17210 Nicotine dependence, cigarettes, uncomplicated: Secondary | ICD-10-CM

## 2023-04-03 DIAGNOSIS — E1122 Type 2 diabetes mellitus with diabetic chronic kidney disease: Secondary | ICD-10-CM | POA: Diagnosis not present

## 2023-04-03 DIAGNOSIS — I69398 Other sequelae of cerebral infarction: Secondary | ICD-10-CM | POA: Diagnosis not present

## 2023-04-03 DIAGNOSIS — I129 Hypertensive chronic kidney disease with stage 1 through stage 4 chronic kidney disease, or unspecified chronic kidney disease: Secondary | ICD-10-CM | POA: Diagnosis not present

## 2023-04-03 NOTE — Progress Notes (Addendum)
 Provider Attestation I agree with the documentation of the Shared Decision Making visit,  smoking cessation counseling if appropriate, and verification or eligibility for lung cancer screening as documented by the RN Nurse Navigator.   Raejean Bullock, MSN, AGACNP-BC [facility] See Amion for personal pager PCCM on call pager [phone]    Virtual Visit via Telephone Note  I connected with Hannah Kirby on 04/03/23 at 10:30 AM EST by telephone and verified that I am speaking with the correct person using two identifiers.  Location: Patient: Hannah Kirby Provider: Alyse Bach, RN   I discussed the limitations, risks, security and privacy concerns of performing an evaluation and management service by telephone and the availability of in person appointments. I also discussed with the patient that there may be a patient responsible charge related to this service. The patient expressed understanding and agreed to proceed.     Shared Decision Making Visit Lung Cancer Screening Program [phone])   Eligibility: Age 78 y.o. Pack Years Smoking History Calculation 112 (# packs/per year x # years smoked) Recent History of coughing up blood  no Unexplained weight loss? no ( >Than 15 pounds within the last 6 months ) Prior History Lung / other cancer no (Diagnosis within the last 5 years already requiring surveillance chest CT Scans). Smoking Status Current Smoker Former Smokers: Years since quit: n/a  Quit Date: n/a  Visit Components: Discussion included one or more decision making aids. yes Discussion included risk/benefits of screening. yes Discussion included potential follow up diagnostic testing for abnormal scans. yes Discussion included meaning and risk of over diagnosis. yes Discussion included meaning and risk of False Positives. yes Discussion included meaning of total radiation exposure. yes  Counseling Included: Importance of  adherence to annual lung cancer LDCT screening. yes Impact of comorbidities on ability to participate in the program. yes Ability and willingness to under diagnostic treatment. yes  Smoking Cessation Counseling: Current Smokers:  Discussed importance of smoking cessation. yes Information about tobacco cessation classes and interventions provided to patient. yes Patient provided with "ticket" for LDCT Scan. no Symptomatic Patient. no  Counseling(Intermediate counseling: > three minutes) 99406 Diagnosis Code: Tobacco Use Z72.0 Asymptomatic Patient yes  Counseling (Intermediate counseling: > three minutes counseling) Z3086 Former Smokers:  Discussed the importance of maintaining cigarette abstinence. yes Diagnosis Code: Personal History of Nicotine  Dependence. V78.469 Information about tobacco cessation classes and interventions provided to patient. Yes Patient provided with "ticket" for LDCT Scan. no Written Order for Lung Cancer Screening with LDCT placed in Epic. Yes (CT Chest Lung Cancer Screening Low Dose W/O CM) [id number] Z12.2-Screening of respiratory organs Z87.891-Personal history of nicotine  dependence   Alyse Bach, RN

## 2023-04-03 NOTE — Patient Instructions (Signed)

## 2023-04-05 ENCOUNTER — Ambulatory Visit
Admission: RE | Admit: 2023-04-05 | Discharge: 2023-04-05 | Disposition: A | Discharge status: Home / Self Care | Payer: Medicare Other | Source: Ambulatory Visit | Attending: Family Medicine | Admitting: Family Medicine

## 2023-04-05 DIAGNOSIS — F17210 Nicotine dependence, cigarettes, uncomplicated: Secondary | ICD-10-CM | POA: Insufficient documentation

## 2023-04-05 DIAGNOSIS — Z87891 Personal history of nicotine dependence: Secondary | ICD-10-CM | POA: Diagnosis not present

## 2023-04-05 DIAGNOSIS — Z122 Encounter for screening for malignant neoplasm of respiratory organs: Secondary | ICD-10-CM | POA: Insufficient documentation

## 2023-04-06 DIAGNOSIS — M6281 Muscle weakness (generalized): Secondary | ICD-10-CM | POA: Diagnosis not present

## 2023-04-06 DIAGNOSIS — E1122 Type 2 diabetes mellitus with diabetic chronic kidney disease: Secondary | ICD-10-CM | POA: Diagnosis not present

## 2023-04-06 DIAGNOSIS — I69398 Other sequelae of cerebral infarction: Secondary | ICD-10-CM | POA: Diagnosis not present

## 2023-04-06 DIAGNOSIS — I69322 Dysarthria following cerebral infarction: Secondary | ICD-10-CM | POA: Diagnosis not present

## 2023-04-06 DIAGNOSIS — J449 Chronic obstructive pulmonary disease, unspecified: Secondary | ICD-10-CM | POA: Diagnosis not present

## 2023-04-06 DIAGNOSIS — I129 Hypertensive chronic kidney disease with stage 1 through stage 4 chronic kidney disease, or unspecified chronic kidney disease: Secondary | ICD-10-CM | POA: Diagnosis not present

## 2023-04-10 ENCOUNTER — Ambulatory Visit: Payer: Self-pay

## 2023-04-10 ENCOUNTER — Telehealth: Payer: Self-pay | Admitting: Family Medicine

## 2023-04-10 ENCOUNTER — Ambulatory Visit: Payer: Medicare Other | Admitting: Family Medicine

## 2023-04-10 VITALS — BP 168/98 | HR 67 | Ht 65.0 in | Wt 149.0 lb

## 2023-04-10 DIAGNOSIS — I10 Essential (primary) hypertension: Secondary | ICD-10-CM | POA: Diagnosis not present

## 2023-04-10 DIAGNOSIS — M6281 Muscle weakness (generalized): Secondary | ICD-10-CM | POA: Diagnosis not present

## 2023-04-10 DIAGNOSIS — N1832 Chronic kidney disease, stage 3b: Secondary | ICD-10-CM | POA: Diagnosis not present

## 2023-04-10 DIAGNOSIS — I69398 Other sequelae of cerebral infarction: Secondary | ICD-10-CM | POA: Diagnosis not present

## 2023-04-10 DIAGNOSIS — E785 Hyperlipidemia, unspecified: Secondary | ICD-10-CM | POA: Diagnosis not present

## 2023-04-10 DIAGNOSIS — E1140 Type 2 diabetes mellitus with diabetic neuropathy, unspecified: Secondary | ICD-10-CM | POA: Diagnosis not present

## 2023-04-10 DIAGNOSIS — I129 Hypertensive chronic kidney disease with stage 1 through stage 4 chronic kidney disease, or unspecified chronic kidney disease: Secondary | ICD-10-CM | POA: Diagnosis not present

## 2023-04-10 DIAGNOSIS — I69322 Dysarthria following cerebral infarction: Secondary | ICD-10-CM | POA: Diagnosis not present

## 2023-04-10 DIAGNOSIS — I2510 Atherosclerotic heart disease of native coronary artery without angina pectoris: Secondary | ICD-10-CM | POA: Diagnosis not present

## 2023-04-10 DIAGNOSIS — F17210 Nicotine dependence, cigarettes, uncomplicated: Secondary | ICD-10-CM | POA: Diagnosis not present

## 2023-04-10 DIAGNOSIS — J449 Chronic obstructive pulmonary disease, unspecified: Secondary | ICD-10-CM | POA: Diagnosis not present

## 2023-04-10 DIAGNOSIS — E1122 Type 2 diabetes mellitus with diabetic chronic kidney disease: Secondary | ICD-10-CM | POA: Diagnosis not present

## 2023-04-10 DIAGNOSIS — F419 Anxiety disorder, unspecified: Secondary | ICD-10-CM | POA: Diagnosis not present

## 2023-04-10 MED ORDER — FELODIPINE ER 5 MG PO TB24: 5.0000 mg | ORAL_TABLET | Freq: Every day | ORAL | 1 refills | Status: DC

## 2023-04-10 NOTE — Patient Instructions (Signed)
 Marland Kitchen  Please review the attached list of medications and notify my office if there are any errors.   . Please bring all of your medications to every appointment so we can make sure that our medication list is the same as yours.

## 2023-04-10 NOTE — Telephone Encounter (Signed)
[facility] sent in certification paperwork via fax 2/14. Front office portion completed and provider signed on 2/17 and faxed back same day VM

## 2023-04-10 NOTE — Telephone Encounter (Signed)
  Chief Complaint: HTN Symptoms: elevated BP, 160/100 Frequency: today but previous HH readings were elevated Pertinent Negatives: Patient denies any sx currently  Disposition: [] ED /[] Urgent Care (no appt availability in office) / [x] Appointment(In office/virtual)/ []  [place] [facility]/ [] Home Care/ [] Refused Recommended Disposition /[] [place] [facility]/ []  Follow-up with PCP Additional Notes: Noreene Larsson, PTA with [facility] calling to report pt elevated BP. Has had past visits with elevated readings as well. Advised to see PCP, scheduled OV today at 1600. Advised pt to not take any extra doses at this time.   Reason for Disposition  Systolic BP  >= 160 OR Diastolic >= 100  Answer Assessment - Initial Assessment Questions 1. BLOOD PRESSURE: "What is the blood pressure?" "Did you take at least two measurements 5 minutes apart?"     160/100 2. ONSET: "When did you take your blood pressure?"     Today  3. HOW: "How did you take your blood pressure?" (e.g., automatic home BP monitor, visiting nurse)     HH BP 4. HISTORY: "Do you have a history of high blood pressure?"     yes 5. MEDICINES: "Are you taking any medicines for blood pressure?" "Have you missed any doses recently?"     Lisinopril and metoprolol  6. OTHER SYMPTOMS: "Do you have any symptoms?" (e.g., blurred vision, chest pain, difficulty breathing, headache, weakness)     no  Protocols used: Blood Pressure - High-A-AH

## 2023-04-10 NOTE — Progress Notes (Addendum)
Established patient visit   Patient: Hannah Kirby   DOB: [birth year]   78 y.o. Female  MRN: [id number] Visit Date: 04/10/2023  Today's healthcare provider: Mila Merry, MD   Chief Complaint  Patient presents with   Hypertension    Patient was seen by home health this morning and was unable to do her PT due to her blood pressure being 160/100.  Patient was last seen here on 03/27/23 and her pressure at that time was 141/84.  Patient did not get her morning medications yesterday and that is when she takes her Lisinopril and one of her metoprolol.   Subjective    Discussed the use of AI scribe software for clinical note transcription with the patient, who gave verbal consent to proceed.  History of Present Illness   Hannah Kirby is a 78 year old female with hypertension who presents with elevated blood pressure and fatigue. She has a history of hypertension and missed a dose of her blood pressure medication yesterday. She typically takes metoprolol twice a day and Zestril once a day. She does not regularly check her blood pressure at home. Her blood pressure was noted to be high today at 164/98. No swelling in her feet or ankles.  She has been feeling 'real tired' since the weekend, with symptoms starting around Thursday or Friday. She also has nasal congestion and has been taking Mucinex DM, which contains 600 mg of guaifenesin and dextromethorphan, to manage her symptoms. She denies taking any other cold medicines or pain relievers.  She has previously taken amlodipine but does not recall any specific issues with it, although there was a mention of a rash several years ago that might have been related to medication use.  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 The home health nurse visits weekly, and the frequency is expected to change to every two weeks soon.        Medications: Outpatient Medications Prior to Visit  Medication Sig   acetaminophen (TYLENOL) 500 MG tablet Take 500 mg by mouth every 6 (six) hours as needed.   albuterol (VENTOLIN HFA) 108 (90 Base) MCG/ACT inhaler Inhale 2 puffs into the lungs every 4 (four) hours as needed for wheezing or shortness of breath.   aspirin (ASPIR-81) 81 MG EC tablet Take 81 mg by mouth daily.   calcium carbonate (TUMS - DOSED IN MG ELEMENTAL CALCIUM) 500 MG chewable tablet Chew 1 tablet by mouth daily as needed.   clopidogrel (PLAVIX) 75 MG tablet Take 1 tablet (75 mg total) by mouth daily.   cyanocobalamin 1000 MCG tablet Take 1,000 mcg by mouth daily.   dapagliflozin propanediol (FARXIGA) 5 MG TABS tablet Take 1 tablet (5 mg total) by mouth daily before breakfast. (Patient taking differently: Take 2.5 mg by mouth 2 (two) times daily.)   Docusate Sodium (DSS) 100 MG CAPS Take 1 capsule by mouth daily.   ezetimibe (ZETIA) 10 MG tablet Take 1 tablet (10 mg total) by mouth daily.   Fluticasone-Umeclidin-Vilant (TRELEGY ELLIPTA) 100-62.5-25 MCG/ACT AEPB Inhale 1 puff into the lungs daily.   HYDROcodone-acetaminophen (NORCO) 7.5-325 MG tablet Take 1 tablet by mouth every 6 (six) hours as needed for moderate pain (pain score 4-6).   insulin glargine (LANTUS) 100 UNIT/ML injection Inject 22 Units into the skin daily.   lisinopril (ZESTRIL) 20 MG tablet Take 1 tablet (20 mg total) by  mouth daily.   meclizine (ANTIVERT) 25 MG tablet Take 25 mg by mouth 3 (three) times daily as needed.   metoprolol tartrate (LOPRESSOR) 100 MG tablet Take 1 tablet (100 mg total) by mouth 2 (two) times daily.   mirtazapine (REMERON) 15 MG tablet Take 1 tablet (15 mg total) by mouth at bedtime.   Multiple Vitamins-Minerals (DAILY MULTI) TABS Take 1 tablet by mouth daily.   nitroGLYCERIN (NITROSTAT) 0.4 MG SL tablet Place 1 tablet (0.4 mg total) under the tongue every 5 (five) minutes as needed.   Omega-3 Fatty Acids (FISH OIL) 1000 MG  CAPS Take 2 capsules by mouth in the morning and at bedtime.    ondansetron (ZOFRAN ODT) 4 MG disintegrating tablet Take 1 tablet (4 mg total) by mouth every 8 (eight) hours as needed for nausea or vomiting.   RABEprazole (ACIPHEX) 20 MG tablet Take 1 tablet (20 mg total) by mouth daily. 15 Mins. before evening meal   rosuvastatin (CRESTOR) 40 MG tablet Take 1 tablet (40 mg total) by mouth daily.   Semaglutide,0.25 or 0.5MG /DOS, 2 MG/1.5ML SOPN Inject 0.5 mg into the skin once a week.   sucralfate (CARAFATE) 1 g tablet Take 1 tablet (1 g total) by mouth 2 (two) times daily. 15 Minutes before evening meal and at bed time   No facility-administered medications prior to visit.        Objective    BP (!) 168/98   Pulse 67   Ht 5\' 5"  (1.651 m)   Wt 149 lb (67.6 kg)   SpO2 97% Comment: on 4 lpm  BMI 24.79 kg/m   Physical Exam   General: Appearance:    Well developed, well nourished female in no acute distress  Eyes:    PERRL, conjunctiva/corneas clear, EOM's intact       Lungs:     Clear to auscultation bilaterally, respirations unlabored  Heart:    Normal heart rate. Normal rhythm. No murmurs, rubs, or gallops.    MS:   All extremities are intact.    Neurologic:   Awake, alert, oriented x 3. No apparent focal neurological defect.         Assessment & Plan       Hypertension Elevated blood pressure readings, possibly due to inconsistent medication adherence and recent illness. No reported use of decongestants known to increase blood pressure. -Continue current antihypertensive medications (Metoprolol BID, Zestril daily). -Add Felodipine 5mg , to be taken in the evening starting today. -Check blood pressure at home regularly and expect a decrease in readings within a few days. -Avoid decongestants and Sudafed due to potential for increasing blood pressure.  Upper Respiratory Symptoms Recent onset of fatigue and nasal congestion, possibly related to a cold. Currently taking Mucinex  DM. -Continue Mucinex DM as needed for congestion. -Avoid decongestants due to hypertension.  Follow-up in 3-4 weeks to assess blood pressure control and response to Felodipine.    Return in about 4 weeks (around 05/08/2023) for Hypertension.      Mila Merry, MD  [facility] [phone] (phone) [phone] (fax)  [facility]

## 2023-04-11 ENCOUNTER — Ambulatory Visit: Payer: Self-pay

## 2023-04-11 NOTE — Telephone Encounter (Signed)
  Chief Complaint: O2 monitoring Symptoms: O2 90-92% on 2L/[place], fatigue Frequency: several days this week, changed from 3L last week  Pertinent Negatives: Patient denies SOB Disposition: [] ED /[] Urgent Care (no appt availability in office) / [] Appointment(In office/virtual)/ []  [place] [facility]/ [] Home Care/ [] Refused Recommended Disposition /[] [place] [facility]/ [x]  Follow-up with PCP Additional Notes: pt's daughter is calling, she reports O2 90-92% on 2L [place]. She has noticed pt more fatigue or SOB with activity. She felt like pt was tolerating 3L better and was wondering is she is able to increase temporarily. Advised her I would send message to provider to get recommendations, advised as long as pt above 90 % and not having SOB then ok to keep on 2L currently. Sent Dr. Sherrie Mustache secure message via EP[employer] but no response will send message back and have nurse or myself FU with pt's daughter. She verbalized understanding.   Reason for Disposition  [1] Monitoring oxygen level (e.g., pulse oximetry) AND [2] fall in oxygen level 4% or more (below known patient baseline, while awake and resting)  Answer Assessment - Initial Assessment Questions 1. MAIN CONCERN OR SYMPTOM: "What's your main concern?" (e.g., low oxygen level, breathing difficulty) "What question do you have?"     90-92%  2.  OXYGEN EQUIPMENT:  Are you having trouble with your oxygen equipment?  (e.g., cannula, mask, tubing, tank, concentrator)     [place] 2L  3. ONSET: "When did the  sx  start?"      Switched from 3L to 2L  4. OXYGEN THERAPY:    - "Do you currently use home oxygen?" (e.g., yes, no).    - If Yes, ask: "What is your oxygen source?" (e.g., O2 tank, O2 concentrator).    - If Yes, ask: "How do you get the oxygen?" (e.g., nasal prongs, face mask).    - If Yes, ask: "How much oxygen are you supposed to use?" (e.g., 1-2 L [place])     Now on 2L/ 5. PULSE OXIMETER:    - "Do you have a pulse oximeter (pulse ox)?"  (e.g., yes,  no)    - If Yes, ask: "Where do you place the probe?" (e.g., fingertip, ear lobe)     yes 6. O2 MONITORING: "What is the oxygen level (pulse ox reading)?" (e.g., 70-100%)     90-92% 8. BREATHING DIFFICULTY: "Are you having any difficulty breathing?" If Yes, ask: "How bad is it?"  (e.g., none, mild, moderate, severe)   - MILD: No SOB at rest, mild SOB with walking, speaks normally in sentences, able to lie down, no retractions, pulse < 100.   - MODERATE: SOB at rest, SOB with minimal exertion and prefers to sit, cannot lie down flat, speaks in phrases, mild retractions, audible wheezing, pulse 100-120.   - SEVERE: Very SOB at rest, speaks in single words, struggling to breathe, sitting hunched forward, retractions, pulse > 120      no 9. OTHER SYMPTOMS: "Do you have any other symptoms?" (e.g., fever, change in sputum)     Fatigue  10. SMOKING: "Do you smoke currently?" "Is there anyone that smokes around you?"  (Note: smoking around oxygen is dangerous!)       Still smokes  Protocols used: COPD Oxygen Monitoring and Hypoxia-A-AH

## 2023-04-12 NOTE — Telephone Encounter (Addendum)
She can turn of 3lpm when ambulating, but leave at 2lpm at rest as long as oxygen >89%

## 2023-04-12 NOTE — Telephone Encounter (Signed)
Gave provider's message to Hannah Kirby Patient's daughter.

## 2023-04-14 DIAGNOSIS — E1122 Type 2 diabetes mellitus with diabetic chronic kidney disease: Secondary | ICD-10-CM | POA: Diagnosis not present

## 2023-04-14 DIAGNOSIS — I69398 Other sequelae of cerebral infarction: Secondary | ICD-10-CM | POA: Diagnosis not present

## 2023-04-14 DIAGNOSIS — J449 Chronic obstructive pulmonary disease, unspecified: Secondary | ICD-10-CM | POA: Diagnosis not present

## 2023-04-14 DIAGNOSIS — I129 Hypertensive chronic kidney disease with stage 1 through stage 4 chronic kidney disease, or unspecified chronic kidney disease: Secondary | ICD-10-CM | POA: Diagnosis not present

## 2023-04-14 DIAGNOSIS — M6281 Muscle weakness (generalized): Secondary | ICD-10-CM | POA: Diagnosis not present

## 2023-04-14 DIAGNOSIS — I69322 Dysarthria following cerebral infarction: Secondary | ICD-10-CM | POA: Diagnosis not present

## 2023-04-15 DIAGNOSIS — M6281 Muscle weakness (generalized): Secondary | ICD-10-CM | POA: Diagnosis not present

## 2023-04-15 DIAGNOSIS — E1122 Type 2 diabetes mellitus with diabetic chronic kidney disease: Secondary | ICD-10-CM | POA: Diagnosis not present

## 2023-04-15 DIAGNOSIS — I69398 Other sequelae of cerebral infarction: Secondary | ICD-10-CM | POA: Diagnosis not present

## 2023-04-15 DIAGNOSIS — I69322 Dysarthria following cerebral infarction: Secondary | ICD-10-CM | POA: Diagnosis not present

## 2023-04-15 DIAGNOSIS — J449 Chronic obstructive pulmonary disease, unspecified: Secondary | ICD-10-CM | POA: Diagnosis not present

## 2023-04-15 DIAGNOSIS — I129 Hypertensive chronic kidney disease with stage 1 through stage 4 chronic kidney disease, or unspecified chronic kidney disease: Secondary | ICD-10-CM | POA: Diagnosis not present

## 2023-04-17 DIAGNOSIS — I69398 Other sequelae of cerebral infarction: Secondary | ICD-10-CM | POA: Diagnosis not present

## 2023-04-17 DIAGNOSIS — I129 Hypertensive chronic kidney disease with stage 1 through stage 4 chronic kidney disease, or unspecified chronic kidney disease: Secondary | ICD-10-CM | POA: Diagnosis not present

## 2023-04-17 DIAGNOSIS — J449 Chronic obstructive pulmonary disease, unspecified: Secondary | ICD-10-CM | POA: Diagnosis not present

## 2023-04-17 DIAGNOSIS — E1122 Type 2 diabetes mellitus with diabetic chronic kidney disease: Secondary | ICD-10-CM | POA: Diagnosis not present

## 2023-04-17 DIAGNOSIS — M6281 Muscle weakness (generalized): Secondary | ICD-10-CM | POA: Diagnosis not present

## 2023-04-17 DIAGNOSIS — I69322 Dysarthria following cerebral infarction: Secondary | ICD-10-CM | POA: Diagnosis not present

## 2023-04-18 ENCOUNTER — Telehealth: Payer: Self-pay | Admitting: *Deleted

## 2023-04-18 DIAGNOSIS — R911 Solitary pulmonary nodule: Secondary | ICD-10-CM

## 2023-04-18 DIAGNOSIS — Z87891 Personal history of nicotine dependence: Secondary | ICD-10-CM

## 2023-04-18 NOTE — Telephone Encounter (Signed)
 Call report

## 2023-04-18 NOTE — Telephone Encounter (Signed)
 IMPRESSION: 1. Lung-RADS 3, probably benign findings. Short-term follow-up in 6 months is recommended with repeat low-dose chest CT without contrast (please use the following order, "CT CHEST LCS NODULE FOLLOW-UP W/O CM"). A 7.4 mm bandlike curvilinear nodule in the medial basilar left lower lobe, new from 08/08/2016 CT, favoring nodular scarring. 2. Three-vessel coronary atherosclerosis. 3. Aortic Atherosclerosis (ICD10-I70.0) and Emphysema (ICD10-J43.9).

## 2023-04-18 NOTE — Telephone Encounter (Signed)
 Called and spoke with patient regarding CT results. Small nodule area seen that was new since 2018 comparison scan. We will repeat scan in 6 months to follow up. Emphysema seen related to smoking history. Aortic Atherosclerosis seen. Pt is on Crestor daily. Pt verbalized understanding. Results/ plans sent to PCP. Order placed for 6 month nodule f/u CT.

## 2023-04-19 DIAGNOSIS — N1832 Chronic kidney disease, stage 3b: Secondary | ICD-10-CM | POA: Diagnosis not present

## 2023-04-19 DIAGNOSIS — F17210 Nicotine dependence, cigarettes, uncomplicated: Secondary | ICD-10-CM | POA: Diagnosis not present

## 2023-04-19 DIAGNOSIS — Z7901 Long term (current) use of anticoagulants: Secondary | ICD-10-CM | POA: Diagnosis not present

## 2023-04-19 DIAGNOSIS — E1122 Type 2 diabetes mellitus with diabetic chronic kidney disease: Secondary | ICD-10-CM | POA: Diagnosis not present

## 2023-04-19 DIAGNOSIS — M6281 Muscle weakness (generalized): Secondary | ICD-10-CM | POA: Diagnosis not present

## 2023-04-19 DIAGNOSIS — Z7951 Long term (current) use of inhaled steroids: Secondary | ICD-10-CM | POA: Diagnosis not present

## 2023-04-19 DIAGNOSIS — Z7985 Long-term (current) use of injectable non-insulin antidiabetic drugs: Secondary | ICD-10-CM | POA: Diagnosis not present

## 2023-04-19 DIAGNOSIS — E785 Hyperlipidemia, unspecified: Secondary | ICD-10-CM | POA: Diagnosis not present

## 2023-04-19 DIAGNOSIS — F419 Anxiety disorder, unspecified: Secondary | ICD-10-CM | POA: Diagnosis not present

## 2023-04-19 DIAGNOSIS — I69314 Frontal lobe and executive function deficit following cerebral infarction: Secondary | ICD-10-CM | POA: Diagnosis not present

## 2023-04-19 DIAGNOSIS — I2510 Atherosclerotic heart disease of native coronary artery without angina pectoris: Secondary | ICD-10-CM | POA: Diagnosis not present

## 2023-04-19 DIAGNOSIS — Z7982 Long term (current) use of aspirin: Secondary | ICD-10-CM | POA: Diagnosis not present

## 2023-04-19 DIAGNOSIS — J449 Chronic obstructive pulmonary disease, unspecified: Secondary | ICD-10-CM | POA: Diagnosis not present

## 2023-04-19 DIAGNOSIS — I69322 Dysarthria following cerebral infarction: Secondary | ICD-10-CM | POA: Diagnosis not present

## 2023-04-19 DIAGNOSIS — I69320 Aphasia following cerebral infarction: Secondary | ICD-10-CM | POA: Diagnosis not present

## 2023-04-19 DIAGNOSIS — I69398 Other sequelae of cerebral infarction: Secondary | ICD-10-CM | POA: Diagnosis not present

## 2023-04-19 DIAGNOSIS — Z9981 Dependence on supplemental oxygen: Secondary | ICD-10-CM | POA: Diagnosis not present

## 2023-04-19 DIAGNOSIS — E1140 Type 2 diabetes mellitus with diabetic neuropathy, unspecified: Secondary | ICD-10-CM | POA: Diagnosis not present

## 2023-04-19 DIAGNOSIS — I69311 Memory deficit following cerebral infarction: Secondary | ICD-10-CM | POA: Diagnosis not present

## 2023-04-19 DIAGNOSIS — I129 Hypertensive chronic kidney disease with stage 1 through stage 4 chronic kidney disease, or unspecified chronic kidney disease: Secondary | ICD-10-CM | POA: Diagnosis not present

## 2023-04-21 DIAGNOSIS — J449 Chronic obstructive pulmonary disease, unspecified: Secondary | ICD-10-CM | POA: Diagnosis not present

## 2023-04-21 DIAGNOSIS — M6281 Muscle weakness (generalized): Secondary | ICD-10-CM | POA: Diagnosis not present

## 2023-04-21 DIAGNOSIS — I69398 Other sequelae of cerebral infarction: Secondary | ICD-10-CM | POA: Diagnosis not present

## 2023-04-21 DIAGNOSIS — I129 Hypertensive chronic kidney disease with stage 1 through stage 4 chronic kidney disease, or unspecified chronic kidney disease: Secondary | ICD-10-CM | POA: Diagnosis not present

## 2023-04-21 DIAGNOSIS — E1122 Type 2 diabetes mellitus with diabetic chronic kidney disease: Secondary | ICD-10-CM | POA: Diagnosis not present

## 2023-04-21 DIAGNOSIS — I69322 Dysarthria following cerebral infarction: Secondary | ICD-10-CM | POA: Diagnosis not present

## 2023-04-21 DIAGNOSIS — R42 Dizziness and giddiness: Secondary | ICD-10-CM | POA: Diagnosis not present

## 2023-04-21 DIAGNOSIS — I639 Cerebral infarction, unspecified: Secondary | ICD-10-CM | POA: Diagnosis not present

## 2023-04-25 DIAGNOSIS — I129 Hypertensive chronic kidney disease with stage 1 through stage 4 chronic kidney disease, or unspecified chronic kidney disease: Secondary | ICD-10-CM | POA: Diagnosis not present

## 2023-04-25 DIAGNOSIS — J449 Chronic obstructive pulmonary disease, unspecified: Secondary | ICD-10-CM | POA: Diagnosis not present

## 2023-04-25 DIAGNOSIS — M6281 Muscle weakness (generalized): Secondary | ICD-10-CM | POA: Diagnosis not present

## 2023-04-25 DIAGNOSIS — I69398 Other sequelae of cerebral infarction: Secondary | ICD-10-CM | POA: Diagnosis not present

## 2023-04-25 DIAGNOSIS — E1122 Type 2 diabetes mellitus with diabetic chronic kidney disease: Secondary | ICD-10-CM | POA: Diagnosis not present

## 2023-04-25 DIAGNOSIS — I69322 Dysarthria following cerebral infarction: Secondary | ICD-10-CM | POA: Diagnosis not present

## 2023-04-27 DIAGNOSIS — I69322 Dysarthria following cerebral infarction: Secondary | ICD-10-CM | POA: Diagnosis not present

## 2023-04-27 DIAGNOSIS — I129 Hypertensive chronic kidney disease with stage 1 through stage 4 chronic kidney disease, or unspecified chronic kidney disease: Secondary | ICD-10-CM | POA: Diagnosis not present

## 2023-04-27 DIAGNOSIS — M6281 Muscle weakness (generalized): Secondary | ICD-10-CM | POA: Diagnosis not present

## 2023-04-27 DIAGNOSIS — I69398 Other sequelae of cerebral infarction: Secondary | ICD-10-CM | POA: Diagnosis not present

## 2023-04-27 DIAGNOSIS — E1122 Type 2 diabetes mellitus with diabetic chronic kidney disease: Secondary | ICD-10-CM | POA: Diagnosis not present

## 2023-04-27 DIAGNOSIS — J449 Chronic obstructive pulmonary disease, unspecified: Secondary | ICD-10-CM | POA: Diagnosis not present

## 2023-05-01 DIAGNOSIS — E1122 Type 2 diabetes mellitus with diabetic chronic kidney disease: Secondary | ICD-10-CM | POA: Diagnosis not present

## 2023-05-01 DIAGNOSIS — I69322 Dysarthria following cerebral infarction: Secondary | ICD-10-CM | POA: Diagnosis not present

## 2023-05-01 DIAGNOSIS — I69398 Other sequelae of cerebral infarction: Secondary | ICD-10-CM | POA: Diagnosis not present

## 2023-05-01 DIAGNOSIS — M6281 Muscle weakness (generalized): Secondary | ICD-10-CM | POA: Diagnosis not present

## 2023-05-01 DIAGNOSIS — J449 Chronic obstructive pulmonary disease, unspecified: Secondary | ICD-10-CM | POA: Diagnosis not present

## 2023-05-01 DIAGNOSIS — I129 Hypertensive chronic kidney disease with stage 1 through stage 4 chronic kidney disease, or unspecified chronic kidney disease: Secondary | ICD-10-CM | POA: Diagnosis not present

## 2023-05-02 ENCOUNTER — Telehealth: Payer: Self-pay | Admitting: Family Medicine

## 2023-05-02 NOTE — Telephone Encounter (Signed)
 [facility] is requesting refill mirtazapine (REMERON) 15 MG tablet  Please advise

## 2023-05-02 NOTE — Telephone Encounter (Signed)
 [facility]  is requesting refills felodipine (PLENDIL) 5 MG 24 hr tablet  Please advise

## 2023-05-03 ENCOUNTER — Other Ambulatory Visit: Payer: Self-pay

## 2023-05-03 DIAGNOSIS — L578 Other skin changes due to chronic exposure to nonionizing radiation: Secondary | ICD-10-CM | POA: Diagnosis not present

## 2023-05-03 DIAGNOSIS — Z859 Personal history of malignant neoplasm, unspecified: Secondary | ICD-10-CM | POA: Diagnosis not present

## 2023-05-03 DIAGNOSIS — Z872 Personal history of diseases of the skin and subcutaneous tissue: Secondary | ICD-10-CM | POA: Diagnosis not present

## 2023-05-03 DIAGNOSIS — L570 Actinic keratosis: Secondary | ICD-10-CM | POA: Diagnosis not present

## 2023-05-03 DIAGNOSIS — Z86018 Personal history of other benign neoplasm: Secondary | ICD-10-CM | POA: Diagnosis not present

## 2023-05-05 DIAGNOSIS — I129 Hypertensive chronic kidney disease with stage 1 through stage 4 chronic kidney disease, or unspecified chronic kidney disease: Secondary | ICD-10-CM | POA: Diagnosis not present

## 2023-05-05 DIAGNOSIS — J449 Chronic obstructive pulmonary disease, unspecified: Secondary | ICD-10-CM | POA: Diagnosis not present

## 2023-05-05 DIAGNOSIS — I69398 Other sequelae of cerebral infarction: Secondary | ICD-10-CM | POA: Diagnosis not present

## 2023-05-05 DIAGNOSIS — M6281 Muscle weakness (generalized): Secondary | ICD-10-CM | POA: Diagnosis not present

## 2023-05-05 DIAGNOSIS — I69322 Dysarthria following cerebral infarction: Secondary | ICD-10-CM | POA: Diagnosis not present

## 2023-05-05 DIAGNOSIS — E1122 Type 2 diabetes mellitus with diabetic chronic kidney disease: Secondary | ICD-10-CM | POA: Diagnosis not present

## 2023-05-07 DIAGNOSIS — R42 Dizziness and giddiness: Secondary | ICD-10-CM | POA: Diagnosis not present

## 2023-05-07 DIAGNOSIS — I639 Cerebral infarction, unspecified: Secondary | ICD-10-CM

## 2023-05-10 ENCOUNTER — Ambulatory Visit (INDEPENDENT_AMBULATORY_CARE_PROVIDER_SITE_OTHER): Payer: Medicare Other | Admitting: Family Medicine

## 2023-05-10 VITALS — BP 145/80 | HR 73 | Temp 98.2°F | Ht 65.0 in | Wt 158.0 lb

## 2023-05-10 DIAGNOSIS — I69322 Dysarthria following cerebral infarction: Secondary | ICD-10-CM

## 2023-05-10 DIAGNOSIS — I10 Essential (primary) hypertension: Secondary | ICD-10-CM | POA: Diagnosis not present

## 2023-05-10 DIAGNOSIS — D3132 Benign neoplasm of left choroid: Secondary | ICD-10-CM | POA: Diagnosis not present

## 2023-05-10 DIAGNOSIS — E7849 Other hyperlipidemia: Secondary | ICD-10-CM

## 2023-05-10 DIAGNOSIS — G4700 Insomnia, unspecified: Secondary | ICD-10-CM | POA: Diagnosis not present

## 2023-05-10 DIAGNOSIS — N1832 Chronic kidney disease, stage 3b: Secondary | ICD-10-CM

## 2023-05-10 DIAGNOSIS — Z961 Presence of intraocular lens: Secondary | ICD-10-CM | POA: Diagnosis not present

## 2023-05-10 LAB — HM DIABETES EYE EXAM

## 2023-05-10 NOTE — Progress Notes (Signed)
 Established patient visit   Patient: Hannah Kirby   DOB: 03-12-1945   78 y.o. Female  MRN: [id number] Visit Date: 05/10/2023  Today's healthcare provider: Mila Merry, MD   Chief Complaint  Patient presents with   Hypertension    Patient was last seen for this on 04/10/23.  At that time management changes included the addition of Felodipine.   Patient states that the Mirtazapine was increased at the last visit from 7.5 mg to 15 mg.  They feel this has been too much for her and just decreased it back to 7.5 mg but has only been back on it 1 day.   Diabetes    FBS today was 120   Subjective    Hypertension Pertinent negatives include no chest pain, headaches, palpitations or shortness of breath.  Diabetes Pertinent negatives for hypoglycemia include no headaches. Pertinent negatives for diabetes include no chest pain and no weakness.   follow up htn, lipids, and depression since starting felodipine, doubling rosuvastatin, and doubling mirtazapine at last visit 1 month ago. She has been much sleepier during the daytime since then, so daughter reduced mirtazapine back to 7.5mg  a few days ago and reports pt more alert the last couple of days. Home SBP mostly in the 130s and 140s. No swelling or shortness of breath. Continues to have trouble verbalizing since CVA which has been very frustrating. No other new c/o  BP Readings from Last 3 Encounters:  05/10/23 (!) 145/80  04/10/23 (!) 168/98  03/27/23 (!) 141/84   Lab Results  Component Value Date   CHOL 152 03/27/2023   HDL 43 03/27/2023   LDLCALC 72 03/27/2023   LDLDIRECT 70 03/27/2023   TRIG 222 (H) 03/27/2023   CHOLHDL 3.5 03/27/2023   Lab Results  Component Value Date   NA 145 (H) 03/27/2023   K 4.2 03/27/2023   CREATININE 1.56 (H) 03/27/2023   EGFR 34 (L) 03/27/2023   GLUCOSE 190 (H) 03/27/2023     Medications: Outpatient Medications Prior to Visit  Medication Sig   acetaminophen (TYLENOL) 500 MG tablet  Take 500 mg by mouth every 6 (six) hours as needed.   albuterol (VENTOLIN HFA) 108 (90 Base) MCG/ACT inhaler Inhale 2 puffs into the lungs every 4 (four) hours as needed for wheezing or shortness of breath.   aspirin (ASPIR-81) 81 MG EC tablet Take 81 mg by mouth daily.   calcium carbonate (TUMS - DOSED IN MG ELEMENTAL CALCIUM) 500 MG chewable tablet Chew 1 tablet by mouth daily as needed.   clopidogrel (PLAVIX) 75 MG tablet Take 1 tablet (75 mg total) by mouth daily.   cyanocobalamin 1000 MCG tablet Take 1,000 mcg by mouth daily.   dapagliflozin propanediol (FARXIGA) 5 MG TABS tablet Take 1 tablet (5 mg total) by mouth daily before breakfast. (Patient taking differently: Take 2.5 mg by mouth 2 (two) times daily.)   Docusate Sodium (DSS) 100 MG CAPS Take 1 capsule by mouth daily.   ezetimibe (ZETIA) 10 MG tablet Take 1 tablet (10 mg total) by mouth daily.   felodipine (PLENDIL) 5 MG 24 hr tablet Take 1 tablet (5 mg total) by mouth daily.   Fluticasone-Umeclidin-Vilant (TRELEGY ELLIPTA) 100-62.5-25 MCG/ACT AEPB Inhale 1 puff into the lungs daily.   HYDROcodone-acetaminophen (NORCO) 7.5-325 MG tablet Take 1 tablet by mouth every 6 (six) hours as needed for moderate pain (pain score 4-6).   insulin glargine (LANTUS) 100 UNIT/ML injection Inject 22 Units into the skin daily.  lisinopril (ZESTRIL) 20 MG tablet Take 1 tablet (20 mg total) by mouth daily.   meclizine (ANTIVERT) 25 MG tablet Take 25 mg by mouth 3 (three) times daily as needed.   metoprolol tartrate (LOPRESSOR) 100 MG tablet Take 1 tablet (100 mg total) by mouth 2 (two) times daily.   mirtazapine (REMERON) 15 MG tablet Take 1 tablet (15 mg total) by mouth at bedtime. (Patient taking differently: Take 7.5 mg by mouth at bedtime.)   Multiple Vitamins-Minerals (DAILY MULTI) TABS Take 1 tablet by mouth daily.   nitroGLYCERIN (NITROSTAT) 0.4 MG SL tablet Place 1 tablet (0.4 mg total) under the tongue every 5 (five) minutes as needed.   Omega-3  Fatty Acids (FISH OIL) 1000 MG CAPS Take 2 capsules by mouth in the morning and at bedtime.    ondansetron (ZOFRAN ODT) 4 MG disintegrating tablet Take 1 tablet (4 mg total) by mouth every 8 (eight) hours as needed for nausea or vomiting.   RABEprazole (ACIPHEX) 20 MG tablet Take 1 tablet (20 mg total) by mouth daily. 15 Mins. before evening meal   rosuvastatin (CRESTOR) 40 MG tablet Take 1 tablet (40 mg total) by mouth daily.   Semaglutide,0.25 or 0.5MG /DOS, 2 MG/1.5ML SOPN Inject 0.5 mg into the skin once a week.   sucralfate (CARAFATE) 1 g tablet Take 1 tablet (1 g total) by mouth 2 (two) times daily. 15 Minutes before evening meal and at bed time   No facility-administered medications prior to visit.    Review of Systems  Respiratory: Negative.  Negative for cough, shortness of breath and wheezing.   Cardiovascular:  Negative for chest pain, palpitations and leg swelling.  Neurological:  Negative for weakness and headaches.       Objective    BP (!) 145/80   Pulse 73   Temp 98.2 F (36.8 C) (Oral)   Ht 5\' 5"  (1.651 m)   Wt 158 lb (71.7 kg)   SpO2 96%   BMI 26.29 kg/m    Physical Exam   General: Appearance:    Well developed, well nourished female in no acute distress  Eyes:    PERRL, conjunctiva/corneas clear, EOM's intact       Lungs:     Clear to auscultation bilaterally, respirations unlabored  Heart:    Normal heart rate. Normal rhythm. No murmurs, rubs, or gallops.    MS:   All extremities are intact.  No foot or ankle edema.   Neurologic:   Awake, alert, oriented x 3. Difficulty with word finding. .         Assessment & Plan     1. Primary hypertension (Primary) Improved with addition of felodipine although more fatigued.  - Renal function panel  Goal SBP <130. Highly allergic to sulfa and prefer to avoid thiazides.  Consider increasing lisinopril after reviewing labs.   2. Other hyperlipidemia Tolerating increase dose of rosuvastatin, LDL goal <55 -  Lipid panel  3. 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 Stage 3b chronic kidney disease (HCC) Checking renal panel today  5. Insomnia, unspecified type Oversedated with 15mg  mirtazapine. Seem somewhat improved since reducing to 7.5 a few days ago by daughters report.           Mila Merry, MD  [facility] [phone] (phone) [phone] (fax)  [facility]

## 2023-05-10 NOTE — Patient Instructions (Signed)
 Hannah Kirby  Please review the attached list of medications and notify my office if there are any errors.   . Please bring all of your medications to every appointment so we can make sure that our medication list is the same as yours.

## 2023-05-11 ENCOUNTER — Encounter: Payer: Self-pay | Admitting: Family Medicine

## 2023-05-11 LAB — RENAL FUNCTION PANEL
Albumin: 3.9 g/dL (ref 3.8–4.8)
BUN/Creatinine Ratio: 25 (ref 12–28)
BUN: 37 mg/dL — ABNORMAL HIGH (ref 8–27)
CO2: 21 mmol/L (ref 20–29)
Calcium: 10.2 mg/dL (ref 8.7–10.3)
Chloride: 105 mmol/L (ref 96–106)
Creatinine, Ser: 1.47 mg/dL — ABNORMAL HIGH (ref 0.57–1.00)
Glucose: 394 mg/dL — ABNORMAL HIGH (ref 70–99)
Phosphorus: 4.6 mg/dL — ABNORMAL HIGH (ref 3.0–4.3)
Potassium: 4.6 mmol/L (ref 3.5–5.2)
Sodium: 140 mmol/L (ref 134–144)
eGFR: 37 mL/min/{1.73_m2} — ABNORMAL LOW (ref >59)

## 2023-05-11 LAB — LIPID PANEL
Chol/HDL Ratio: 2.6 ratio (ref 0.0–4.4)
Cholesterol, Total: 180 mg/dL (ref 100–199)
HDL: 68 mg/dL (ref >39)
LDL Chol Calc (NIH): 69 mg/dL (ref 0–99)
Triglycerides: 271 mg/dL — ABNORMAL HIGH (ref 0–149)
VLDL Cholesterol Cal: 43 mg/dL — ABNORMAL HIGH (ref 5–40)

## 2023-05-12 DIAGNOSIS — J449 Chronic obstructive pulmonary disease, unspecified: Secondary | ICD-10-CM | POA: Diagnosis not present

## 2023-05-12 DIAGNOSIS — M6281 Muscle weakness (generalized): Secondary | ICD-10-CM | POA: Diagnosis not present

## 2023-05-12 DIAGNOSIS — E1122 Type 2 diabetes mellitus with diabetic chronic kidney disease: Secondary | ICD-10-CM | POA: Diagnosis not present

## 2023-05-12 DIAGNOSIS — I69322 Dysarthria following cerebral infarction: Secondary | ICD-10-CM | POA: Diagnosis not present

## 2023-05-12 DIAGNOSIS — I129 Hypertensive chronic kidney disease with stage 1 through stage 4 chronic kidney disease, or unspecified chronic kidney disease: Secondary | ICD-10-CM | POA: Diagnosis not present

## 2023-05-12 DIAGNOSIS — I69398 Other sequelae of cerebral infarction: Secondary | ICD-10-CM | POA: Diagnosis not present

## 2023-05-15 DIAGNOSIS — R42 Dizziness and giddiness: Secondary | ICD-10-CM | POA: Diagnosis not present

## 2023-05-15 DIAGNOSIS — I639 Cerebral infarction, unspecified: Secondary | ICD-10-CM | POA: Diagnosis not present

## 2023-05-18 ENCOUNTER — Telehealth: Payer: Self-pay | Admitting: Cardiovascular Disease

## 2023-05-18 DIAGNOSIS — M6281 Muscle weakness (generalized): Secondary | ICD-10-CM | POA: Diagnosis not present

## 2023-05-18 DIAGNOSIS — I129 Hypertensive chronic kidney disease with stage 1 through stage 4 chronic kidney disease, or unspecified chronic kidney disease: Secondary | ICD-10-CM | POA: Diagnosis not present

## 2023-05-18 DIAGNOSIS — J449 Chronic obstructive pulmonary disease, unspecified: Secondary | ICD-10-CM | POA: Diagnosis not present

## 2023-05-18 DIAGNOSIS — E1122 Type 2 diabetes mellitus with diabetic chronic kidney disease: Secondary | ICD-10-CM | POA: Diagnosis not present

## 2023-05-18 DIAGNOSIS — I69322 Dysarthria following cerebral infarction: Secondary | ICD-10-CM | POA: Diagnosis not present

## 2023-05-18 DIAGNOSIS — I69398 Other sequelae of cerebral infarction: Secondary | ICD-10-CM | POA: Diagnosis not present

## 2023-05-18 NOTE — Telephone Encounter (Signed)
error 

## 2023-05-18 NOTE — Telephone Encounter (Signed)
 Returned the call to the daughter, per the dpr. The daughter has been made aware of the results:  Monitor results: patient know the second heart monitor she wore showed 2 runs of a fast heart beat. No afib or other arrhythmias detected.

## 2023-05-18 NOTE — Telephone Encounter (Signed)
 Pt daughter calling in regards to results due to pt not understanding. Please advise

## 2023-05-19 ENCOUNTER — Telehealth: Payer: Self-pay

## 2023-05-19 DIAGNOSIS — Z7951 Long term (current) use of inhaled steroids: Secondary | ICD-10-CM | POA: Diagnosis not present

## 2023-05-19 DIAGNOSIS — I69322 Dysarthria following cerebral infarction: Secondary | ICD-10-CM | POA: Diagnosis not present

## 2023-05-19 DIAGNOSIS — Z7982 Long term (current) use of aspirin: Secondary | ICD-10-CM | POA: Diagnosis not present

## 2023-05-19 DIAGNOSIS — M6281 Muscle weakness (generalized): Secondary | ICD-10-CM | POA: Diagnosis not present

## 2023-05-19 DIAGNOSIS — E1140 Type 2 diabetes mellitus with diabetic neuropathy, unspecified: Secondary | ICD-10-CM | POA: Diagnosis not present

## 2023-05-19 DIAGNOSIS — I2510 Atherosclerotic heart disease of native coronary artery without angina pectoris: Secondary | ICD-10-CM | POA: Diagnosis not present

## 2023-05-19 DIAGNOSIS — F419 Anxiety disorder, unspecified: Secondary | ICD-10-CM | POA: Diagnosis not present

## 2023-05-19 DIAGNOSIS — Z7985 Long-term (current) use of injectable non-insulin antidiabetic drugs: Secondary | ICD-10-CM | POA: Diagnosis not present

## 2023-05-19 DIAGNOSIS — Z7902 Long term (current) use of antithrombotics/antiplatelets: Secondary | ICD-10-CM | POA: Diagnosis not present

## 2023-05-19 DIAGNOSIS — I69314 Frontal lobe and executive function deficit following cerebral infarction: Secondary | ICD-10-CM | POA: Diagnosis not present

## 2023-05-19 DIAGNOSIS — I69311 Memory deficit following cerebral infarction: Secondary | ICD-10-CM | POA: Diagnosis not present

## 2023-05-19 DIAGNOSIS — Z9981 Dependence on supplemental oxygen: Secondary | ICD-10-CM | POA: Diagnosis not present

## 2023-05-19 DIAGNOSIS — I129 Hypertensive chronic kidney disease with stage 1 through stage 4 chronic kidney disease, or unspecified chronic kidney disease: Secondary | ICD-10-CM | POA: Diagnosis not present

## 2023-05-19 DIAGNOSIS — Z79810 Long term (current) use of selective estrogen receptor modulators (SERMs): Secondary | ICD-10-CM | POA: Diagnosis not present

## 2023-05-19 DIAGNOSIS — J449 Chronic obstructive pulmonary disease, unspecified: Secondary | ICD-10-CM | POA: Diagnosis not present

## 2023-05-19 DIAGNOSIS — I69398 Other sequelae of cerebral infarction: Secondary | ICD-10-CM | POA: Diagnosis not present

## 2023-05-19 DIAGNOSIS — E785 Hyperlipidemia, unspecified: Secondary | ICD-10-CM | POA: Diagnosis not present

## 2023-05-19 DIAGNOSIS — E1122 Type 2 diabetes mellitus with diabetic chronic kidney disease: Secondary | ICD-10-CM | POA: Diagnosis not present

## 2023-05-19 DIAGNOSIS — F17210 Nicotine dependence, cigarettes, uncomplicated: Secondary | ICD-10-CM | POA: Diagnosis not present

## 2023-05-19 DIAGNOSIS — I69320 Aphasia following cerebral infarction: Secondary | ICD-10-CM | POA: Diagnosis not present

## 2023-05-19 DIAGNOSIS — N1832 Chronic kidney disease, stage 3b: Secondary | ICD-10-CM | POA: Diagnosis not present

## 2023-05-19 DIAGNOSIS — Z794 Long term (current) use of insulin: Secondary | ICD-10-CM | POA: Diagnosis not present

## 2023-05-19 NOTE — Telephone Encounter (Signed)
 Ok for Presenter, broadcasting. Does she want to try medication for depression?

## 2023-05-19 NOTE — Telephone Encounter (Signed)
 Copied from CRM [id number]. Topic: Clinical - Home Health Verbal Orders >> May 19, 2023  8:56 AM Izetta Dakin wrote: Caller/Agency: Tom/ [facility] Callback Number: [phone] Service Requested: Speech Therapy Frequency: 2x4, 1x4 Any new concerns about the patient? Yes, patient displaying increase sign of depression. Refusing to wear oxygen. No SI, moderate aphasia. Request SW evaluation.

## 2023-05-22 ENCOUNTER — Telehealth: Payer: Self-pay

## 2023-05-22 NOTE — Telephone Encounter (Signed)
 Please clarify, do they need an order of speech therapy? Home health should call or send order request about their recommendation.

## 2023-05-22 NOTE — Telephone Encounter (Unsigned)
 Copied from CRM [id number]. Topic: General - Other >> May 22, 2023  1:45 PM Truddie Crumble wrote: Reason for CRM: patient daughter Chaya Jan) called stating the patient need more intense speech therapy and the patient daughter was instructed by home health to call the office to discuss. Patient daughter also stated she think someone called the patient about prescribing more medications to her CB [phone]

## 2023-05-23 ENCOUNTER — Other Ambulatory Visit: Payer: Self-pay

## 2023-05-23 DIAGNOSIS — R4702 Dysphasia: Secondary | ICD-10-CM

## 2023-05-23 NOTE — Telephone Encounter (Signed)
 Family requesting outpatient speech therapy at least twice weekly.  Order placed

## 2023-05-24 DIAGNOSIS — I69314 Frontal lobe and executive function deficit following cerebral infarction: Secondary | ICD-10-CM | POA: Diagnosis not present

## 2023-05-24 DIAGNOSIS — I69398 Other sequelae of cerebral infarction: Secondary | ICD-10-CM | POA: Diagnosis not present

## 2023-05-24 DIAGNOSIS — I69322 Dysarthria following cerebral infarction: Secondary | ICD-10-CM | POA: Diagnosis not present

## 2023-05-24 DIAGNOSIS — M6281 Muscle weakness (generalized): Secondary | ICD-10-CM | POA: Diagnosis not present

## 2023-05-24 DIAGNOSIS — I69311 Memory deficit following cerebral infarction: Secondary | ICD-10-CM | POA: Diagnosis not present

## 2023-05-24 DIAGNOSIS — I69320 Aphasia following cerebral infarction: Secondary | ICD-10-CM | POA: Diagnosis not present

## 2023-05-26 DIAGNOSIS — I69320 Aphasia following cerebral infarction: Secondary | ICD-10-CM | POA: Diagnosis not present

## 2023-05-26 DIAGNOSIS — I69398 Other sequelae of cerebral infarction: Secondary | ICD-10-CM | POA: Diagnosis not present

## 2023-05-26 DIAGNOSIS — I69314 Frontal lobe and executive function deficit following cerebral infarction: Secondary | ICD-10-CM | POA: Diagnosis not present

## 2023-05-26 DIAGNOSIS — M6281 Muscle weakness (generalized): Secondary | ICD-10-CM | POA: Diagnosis not present

## 2023-05-26 DIAGNOSIS — I69322 Dysarthria following cerebral infarction: Secondary | ICD-10-CM | POA: Diagnosis not present

## 2023-05-26 DIAGNOSIS — I69311 Memory deficit following cerebral infarction: Secondary | ICD-10-CM | POA: Diagnosis not present

## 2023-05-27 DIAGNOSIS — I69311 Memory deficit following cerebral infarction: Secondary | ICD-10-CM | POA: Diagnosis not present

## 2023-05-27 DIAGNOSIS — I69398 Other sequelae of cerebral infarction: Secondary | ICD-10-CM | POA: Diagnosis not present

## 2023-05-27 DIAGNOSIS — M6281 Muscle weakness (generalized): Secondary | ICD-10-CM | POA: Diagnosis not present

## 2023-05-27 DIAGNOSIS — I69320 Aphasia following cerebral infarction: Secondary | ICD-10-CM | POA: Diagnosis not present

## 2023-05-27 DIAGNOSIS — I69314 Frontal lobe and executive function deficit following cerebral infarction: Secondary | ICD-10-CM | POA: Diagnosis not present

## 2023-05-27 DIAGNOSIS — I69322 Dysarthria following cerebral infarction: Secondary | ICD-10-CM | POA: Diagnosis not present

## 2023-05-28 ENCOUNTER — Telehealth: Payer: Self-pay | Admitting: Family Medicine

## 2023-05-28 NOTE — Telephone Encounter (Signed)
 Pt has only been taking 1/2 x 15mg  mirtazapine. Do they want to change to a 7.5mg  tablet?

## 2023-05-29 ENCOUNTER — Telehealth: Payer: Self-pay

## 2023-05-29 ENCOUNTER — Other Ambulatory Visit: Payer: Self-pay

## 2023-05-29 NOTE — Patient Outreach (Signed)
 Error Please Disregard

## 2023-05-29 NOTE — Telephone Encounter (Signed)
 Mailbox full unable to leave a message. Ok for [employer] to verify

## 2023-05-30 DIAGNOSIS — I69398 Other sequelae of cerebral infarction: Secondary | ICD-10-CM | POA: Diagnosis not present

## 2023-05-30 DIAGNOSIS — I69311 Memory deficit following cerebral infarction: Secondary | ICD-10-CM | POA: Diagnosis not present

## 2023-05-30 DIAGNOSIS — I69322 Dysarthria following cerebral infarction: Secondary | ICD-10-CM | POA: Diagnosis not present

## 2023-05-30 DIAGNOSIS — M6281 Muscle weakness (generalized): Secondary | ICD-10-CM | POA: Diagnosis not present

## 2023-05-30 DIAGNOSIS — I69314 Frontal lobe and executive function deficit following cerebral infarction: Secondary | ICD-10-CM | POA: Diagnosis not present

## 2023-05-30 DIAGNOSIS — I69320 Aphasia following cerebral infarction: Secondary | ICD-10-CM | POA: Diagnosis not present

## 2023-05-31 DIAGNOSIS — J449 Chronic obstructive pulmonary disease, unspecified: Secondary | ICD-10-CM | POA: Diagnosis not present

## 2023-05-31 DIAGNOSIS — I69320 Aphasia following cerebral infarction: Secondary | ICD-10-CM | POA: Diagnosis not present

## 2023-05-31 DIAGNOSIS — I2510 Atherosclerotic heart disease of native coronary artery without angina pectoris: Secondary | ICD-10-CM | POA: Diagnosis not present

## 2023-05-31 DIAGNOSIS — I69398 Other sequelae of cerebral infarction: Secondary | ICD-10-CM | POA: Diagnosis not present

## 2023-05-31 DIAGNOSIS — E1122 Type 2 diabetes mellitus with diabetic chronic kidney disease: Secondary | ICD-10-CM | POA: Diagnosis not present

## 2023-05-31 DIAGNOSIS — I69314 Frontal lobe and executive function deficit following cerebral infarction: Secondary | ICD-10-CM | POA: Diagnosis not present

## 2023-05-31 DIAGNOSIS — M6281 Muscle weakness (generalized): Secondary | ICD-10-CM | POA: Diagnosis not present

## 2023-05-31 DIAGNOSIS — I129 Hypertensive chronic kidney disease with stage 1 through stage 4 chronic kidney disease, or unspecified chronic kidney disease: Secondary | ICD-10-CM | POA: Diagnosis not present

## 2023-05-31 DIAGNOSIS — E1140 Type 2 diabetes mellitus with diabetic neuropathy, unspecified: Secondary | ICD-10-CM | POA: Diagnosis not present

## 2023-05-31 DIAGNOSIS — N1832 Chronic kidney disease, stage 3b: Secondary | ICD-10-CM | POA: Diagnosis not present

## 2023-05-31 DIAGNOSIS — N2581 Secondary hyperparathyroidism of renal origin: Secondary | ICD-10-CM | POA: Diagnosis not present

## 2023-05-31 DIAGNOSIS — R809 Proteinuria, unspecified: Secondary | ICD-10-CM | POA: Diagnosis not present

## 2023-05-31 DIAGNOSIS — I69311 Memory deficit following cerebral infarction: Secondary | ICD-10-CM | POA: Diagnosis not present

## 2023-05-31 DIAGNOSIS — I69322 Dysarthria following cerebral infarction: Secondary | ICD-10-CM | POA: Diagnosis not present

## 2023-06-01 DIAGNOSIS — G459 Transient cerebral ischemic attack, unspecified: Secondary | ICD-10-CM | POA: Diagnosis not present

## 2023-06-01 DIAGNOSIS — R200 Anesthesia of skin: Secondary | ICD-10-CM | POA: Diagnosis not present

## 2023-06-01 DIAGNOSIS — R479 Unspecified speech disturbances: Secondary | ICD-10-CM | POA: Diagnosis not present

## 2023-06-01 DIAGNOSIS — R270 Ataxia, unspecified: Secondary | ICD-10-CM | POA: Diagnosis not present

## 2023-06-01 DIAGNOSIS — R410 Disorientation, unspecified: Secondary | ICD-10-CM | POA: Diagnosis not present

## 2023-06-01 DIAGNOSIS — R202 Paresthesia of skin: Secondary | ICD-10-CM | POA: Diagnosis not present

## 2023-06-01 DIAGNOSIS — I679 Cerebrovascular disease, unspecified: Secondary | ICD-10-CM | POA: Diagnosis not present

## 2023-06-01 DIAGNOSIS — Z8673 Personal history of transient ischemic attack (TIA), and cerebral infarction without residual deficits: Secondary | ICD-10-CM | POA: Diagnosis not present

## 2023-06-01 DIAGNOSIS — R413 Other amnesia: Secondary | ICD-10-CM | POA: Diagnosis not present

## 2023-06-02 DIAGNOSIS — I69314 Frontal lobe and executive function deficit following cerebral infarction: Secondary | ICD-10-CM | POA: Diagnosis not present

## 2023-06-02 DIAGNOSIS — I69322 Dysarthria following cerebral infarction: Secondary | ICD-10-CM | POA: Diagnosis not present

## 2023-06-02 DIAGNOSIS — M6281 Muscle weakness (generalized): Secondary | ICD-10-CM | POA: Diagnosis not present

## 2023-06-02 DIAGNOSIS — I69398 Other sequelae of cerebral infarction: Secondary | ICD-10-CM | POA: Diagnosis not present

## 2023-06-02 DIAGNOSIS — I69311 Memory deficit following cerebral infarction: Secondary | ICD-10-CM | POA: Diagnosis not present

## 2023-06-02 DIAGNOSIS — I69320 Aphasia following cerebral infarction: Secondary | ICD-10-CM | POA: Diagnosis not present

## 2023-06-03 ENCOUNTER — Other Ambulatory Visit: Payer: Self-pay | Admitting: Family Medicine

## 2023-06-06 DIAGNOSIS — I69311 Memory deficit following cerebral infarction: Secondary | ICD-10-CM | POA: Diagnosis not present

## 2023-06-06 DIAGNOSIS — M6281 Muscle weakness (generalized): Secondary | ICD-10-CM | POA: Diagnosis not present

## 2023-06-06 DIAGNOSIS — I69320 Aphasia following cerebral infarction: Secondary | ICD-10-CM | POA: Diagnosis not present

## 2023-06-06 DIAGNOSIS — I69314 Frontal lobe and executive function deficit following cerebral infarction: Secondary | ICD-10-CM | POA: Diagnosis not present

## 2023-06-06 DIAGNOSIS — I69398 Other sequelae of cerebral infarction: Secondary | ICD-10-CM | POA: Diagnosis not present

## 2023-06-06 DIAGNOSIS — I69322 Dysarthria following cerebral infarction: Secondary | ICD-10-CM | POA: Diagnosis not present

## 2023-06-06 MED ORDER — MIRTAZAPINE 7.5 MG PO TABS: 7.5000 mg | ORAL_TABLET | Freq: Every day | ORAL | 2 refills | Status: DC

## 2023-06-06 NOTE — Addendum Note (Signed)
 Addended by: Lamon Pillow on: 06/06/2023 08:34 AM   Modules accepted: Orders

## 2023-06-07 ENCOUNTER — Ambulatory Visit: Attending: Family Medicine

## 2023-06-07 DIAGNOSIS — R41841 Cognitive communication deficit: Secondary | ICD-10-CM | POA: Insufficient documentation

## 2023-06-07 DIAGNOSIS — R4702 Dysphasia: Secondary | ICD-10-CM | POA: Diagnosis not present

## 2023-06-07 DIAGNOSIS — R4701 Aphasia: Secondary | ICD-10-CM | POA: Insufficient documentation

## 2023-06-07 DIAGNOSIS — R471 Dysarthria and anarthria: Secondary | ICD-10-CM | POA: Diagnosis not present

## 2023-06-07 DIAGNOSIS — I69890 Apraxia following other cerebrovascular disease: Secondary | ICD-10-CM | POA: Insufficient documentation

## 2023-06-07 NOTE — Therapy (Signed)
 OUTPATIENT SPEECH LANGUAGE PATHOLOGY APHASIA EVALUATION   Patient Name: Hannah Kirby MRN: [id number] DOB:12-08-1945, 78 y.o., female Today's Date: 06/07/2023  PCP: Jeralene Mom, MD REFERRING PROVIDER: Jeralene Mom, MD   End of Session - 06/07/23 1152     Visit Number 1    Number of Visits 24    Date for SLP Re-Evaluation 08/30/23    SLP Start Time 1040    SLP Stop Time  1130    SLP Time Calculation (min) 50 min    Activity Tolerance Patient tolerated treatment well             Past Medical History:  Diagnosis Date   Acute hemorrhagic colitis 08/08/2019   Anemia    Anxiety    Arthritis    C. difficile colitis 08/09/2019   CAD (coronary artery disease)    a. 02/2006 PCI: BMS x 2 to RCA, cath o/w without significant coronary disease; b. nuclear stress test 07/2014: No ischemia/infarct; c. 11/2017 MV: no isch/infarct, EF 55-65%; d. 03/2020 NSTEMI/PCI: LM nl, LAD min irregs, RI 25, small, LCX nl, RCA 30p/m ISR, 99d (3.0x15 Resolute Onyx DES).   Cerebrovascular accident (CVA) (HCC) 03/18/2023   dysarthria   Chronic bronchitis (HCC)    secondary to cigarette smoking   Chronic kidney disease (CKD), stage III (moderate) (HCC)    COPD (chronic obstructive pulmonary disease) (HCC)    Diabetes mellitus    Diastolic dysfunction    a. 07/2019 Echo: EF 60-65%, no rwma, mod LVH. Nl RV size/fxn; b. 03/2020 Echo: EF 60-65%, mod LVH, gr1 DD, nl RV size/fxn, mild AS.   FHx: allergies    GERD (gastroesophageal reflux disease)    Goiter    Granulomatous disease (HCC)    Hernia    Hyperlipidemia    Hypertension    Kidney stone on left side 2013   Microalbuminuria    NSTEMI (non-ST elevated myocardial infarction) (HCC) 03/23/2020   Obesity    Panic attacks    PVC's (premature ventricular contractions)    a. 03/2018 Zio: Occas PVCs (2.5%). Triggered events assoc w/ PVC/PAC.   Retinal tear 2020   Smokers' cough (HCC)    Spinal stenosis    Stroke (HCC) 10/29/2016   mild left side  weakness   Stroke (HCC) 08/01/2019   Tobacco abuse    Past Surgical History:  Procedure Laterality Date   ABDOMINAL HYSTERECTOMY     BREAST SURGERY     CATARACT EXTRACTION W/PHACO Right 03/01/2017   Procedure: CATARACT EXTRACTION PHACO AND INTRAOCULAR LENS PLACEMENT (IOC) RIGHT DIABETIC;  Surgeon: Annell Kidney, MD;  Location: [facility];  Service: Ophthalmology;  Laterality: Right;   CATARACT EXTRACTION W/PHACO Left 03/22/2017   Procedure: CATARACT EXTRACTION PHACO AND INTRAOCULAR LENS PLACEMENT (IOC) LEFT DIABETIC;  Surgeon: Annell Kidney, MD;  Location: [facility];  Service: Ophthalmology;  Laterality: Left;  Diabetic - insulin and oral meds   COLONOSCOPY WITH PROPOFOL N/A 09/23/2014   Procedure: COLONOSCOPY WITH PROPOFOL;  Surgeon: Deveron Fly, MD;  Location: [facility] ENDOSCOPY;  Service: Endoscopy;  Laterality: N/A;   COLONOSCOPY WITH PROPOFOL N/A 01/11/2018   Procedure: COLONOSCOPY WITH PROPOFOL;  Surgeon: Deveron Fly, MD;  Location: [facility] ENDOSCOPY;  Service: Endoscopy;  Laterality: N/A;   COLONOSCOPY WITH PROPOFOL N/A 04/24/2018   Procedure: COLONOSCOPY WITH PROPOFOL;  Surgeon: Deveron Fly, MD;  Location: [facility] ENDOSCOPY;  Service: Endoscopy;  Laterality: N/A;   COLONOSCOPY WITH PROPOFOL N/A 11/19/2019   Procedure: COLONOSCOPY WITH PROPOFOL;  Surgeon: Ole Berkeley,  Darren, MD;  Location: [facility] ENDOSCOPY;  Service: Endoscopy;  Laterality: N/A;   CORONARY ANGIOPLASTY WITH STENT PLACEMENT  2008   CORONARY STENT INTERVENTION N/A 03/24/2020   Procedure: CORONARY STENT INTERVENTION;  Surgeon: Sammy Crisp, MD;  Location: [facility] INVASIVE CV LAB;  Service: Cardiovascular;  Laterality: N/A;   ESOPHAGOGASTRODUODENOSCOPY (EGD) WITH PROPOFOL N/A 12/30/2014   Procedure: ESOPHAGOGASTRODUODENOSCOPY (EGD) WITH PROPOFOL;  Surgeon: Deveron Fly, MD;  Location: [facility] ENDOSCOPY;  Service: Endoscopy;  Laterality: N/A;   ESOPHAGOGASTRODUODENOSCOPY (EGD) WITH PROPOFOL N/A  07/19/2016   Procedure: ESOPHAGOGASTRODUODENOSCOPY (EGD) WITH PROPOFOL;  Surgeon: Deveron Fly, MD;  Location: [facility] ENDOSCOPY;  Service: Endoscopy;  Laterality: N/A;   ESOPHAGOGASTRODUODENOSCOPY (EGD) WITH PROPOFOL N/A 04/24/2018   Procedure: ESOPHAGOGASTRODUODENOSCOPY (EGD) WITH PROPOFOL;  Surgeon: Deveron Fly, MD;  Location: [facility] ENDOSCOPY;  Service: Endoscopy;  Laterality: N/A;   hysterectomy (other)     LEFT HEART CATH AND CORONARY ANGIOGRAPHY N/A 03/24/2020   Procedure: LEFT HEART CATH AND CORONARY ANGIOGRAPHY;  Surgeon: Sammy Crisp, MD;  Location: [facility] INVASIVE CV LAB;  Service: Cardiovascular;  Laterality: N/A;   Patient Active Problem List   Diagnosis Date Noted   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) 03/27/2023   Dysarthria 03/17/2023   Chronic obstructive pulmonary disease (HCC) 03/17/2023   Dizziness 02/23/2023   Dehydration 02/20/2023   Restrictive airway disease 07/21/2020   Partial thickness rotator cuff tear 03/20/2020   Shoulder pain 03/05/2020   Retinal detachment, left 03/04/2020   Ataxia 02/28/2020   Difficulty walking 02/28/2020   Physical deconditioning 02/28/2020   Chronic diastolic congestive heart failure (HCC) 11/20/2019   Polyp of ascending colon    Hypokalemia 08/08/2019   TIA (transient ischemic attack) 08/01/2019   Benign hypertensive kidney disease with chronic kidney disease 04/25/2019   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   Chronic, continuous use of opioids 03/26/2018   History of adenomatous polyp of colon 01/22/2018   Ulceration of colon determined by endoscopy 01/22/2018   Diverticulosis of colon 01/22/2018   History of CVA (cerebrovascular accident) 11/08/2016   Weakness of left upper extremity 10/30/2016   AAA (abdominal aortic aneurysm) without rupture (HCC) 08/12/2016   Barrett esophagus  07/21/2016   Stage 3b chronic kidney disease (HCC) 07/27/2015   Chest pain at rest 05/06/2015   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HCC) 10/28/2014   Solitary pulmonary nodule on lung CT 08/20/2014   Allergic rhinitis 07/28/2014   CAFL (chronic airflow limitation) (HCC) 07/28/2014   Acid reflux 07/28/2014   Granuloma annulare    Back pain 11/12/2013   Lumbar scoliosis 10/30/2013   Lumbar radiculopathy 10/30/2013   Lumbar canal stenosis 10/30/2013   Calcium blood increased 10/22/2013   Proteinuria 10/22/2013   Obesity 03/21/2011   Smokes with greater than 30 pack year history 03/21/2011   Edema 08/18/2010   Hyperlipidemia 03/25/2009   Coronary artery disease 03/25/2009   Primary hypertension 03/25/2009    ONSET DATE: 03/17/23 (admitted with stroke), 05/23/23 (referral date)  REFERRING DIAG:  R41.841 (ICD-10-CM) - Cognitive communication deficit  I63.9 (ICD-10-CM) - CVA (cerebral vascular accident) (HCC)  R47.02 (ICD-10-CM) - Dysphasia    THERAPY DIAG:  Aphasia  Apraxia following other cerebrovascular disease  Dysarthria and anarthria  Cognitive communication deficit  Rationale for Evaluation and Treatment Rehabilitation  SUBJECTIVE:   SUBJECTIVE STATEMENT: Pt alert, pleasant, and cooperative. On home O2.  Pt accompanied by: self, significant other,  and family member  PERTINENT HISTORY:  Pt is a 78 y.o. female who presents for communication evaluation in setting on stroke. Pt in ED 1/24-1/25/25. Pt d/c'd home with HH ST. PMHx significant of   HTN, CKD stage IIIB, DMT2 on insulin, CAD, chronic smoking, chronic respiratory failure with COPD and on home oxygen, anxiety, HLD. MRI 03/17/23 "1. Punctate foci of restricted diffusion in the left posterior  frontal white matter, most likely acute infarcts.  2. Numerous foci of hemosiderin deposition, which are seen in the  deep gray structures but also in the bilateral cerebral hemispheres,  with 1 new focus compared  to 02/20/2023. While this could be the  sequela of chronic hypertensive microhemorrhages, this appearance is  concerning for cerebral amyloid angiopathy."   PAIN:  Are you having pain? No  FALLS: defer to PT; under care of HH PT  LIVING ENVIRONMENT: Lives with: lives with their spouse Lives in: House/apartment  PLOF:  Level of assistance: Independent with ADLs Employment: Retired   PATIENT GOALS    for communication to improve   OBJECTIVE:  COGNITION: Overall cognitive status: Difficulty to assess due to: Communication impairment   AUDITORY COMPREHENSION: Overall auditory comprehension: Impaired: moderately complex and complex YES/NO questions: Appears intact Following directions: Impaired: moderately complex and complex Conversation: Complex Interfering components: processing speed Effective technique: extra processing time, repetition/stressing words, and visual/gestural cues   READING COMPREHENSION: TBA  EXPRESSION: verbal  VERBAL EXPRESSION: Overall verbal expression: Impaired: simple, moderately complex, and complex Level of generative/spontaneous verbalization: word and phrase Automatic speech: name: intact and social response: intact  Repetition: Impaired: Word, phrase, and sentence Naming: Confrontation: 26-50% and Divergent: reduced Pragmatics: Impaired: topic maintenance Comments: due to aphasia Effective technique: semantic cues, sentence completion, and phonemic cues  WRITTEN EXPRESSION: TBA  MOTOR SPEECH: Overall motor speech: impaired Level of impairment: minimal articulatory imprecision across all levels Intelligibility: Intelligible Motor planning: Impaired: aware and inconsistent    ORAL MOTOR EXAMINATION No facial droop appreciated  STANDARDIZED ASSESSMENTS: N/A -informal evaluation with selected subtests of WAB-R completed   PATIENT REPORTED OUTCOME MEASURES (PROM):  The Communication Effectiveness Survey is a patient-reported  outcome measure in which the patient rates their own effectiveness in different communication situations. A higher score indicates greater effectiveness.   Pt's self-rating was 15/32.   Having a conversation with a family member or friends at home. 2 Participating in conversation with strangers in a quiet place. 2 Conversing with a familiar person over the telephone. 2 Conversing with a stranger over the telephone. 1- Not at all effective Being part of a conversation in a noisy environment (social gathering). 2 Speaking to a friend when you are emotionally upset or you are angry. 2 Having a conversation while traveling in a car. 2 Having a conversation with someone at a distance (across a room). 2   TODAY'S TREATMENT:  Pt and family educated at length re: role of SLP, results of assessment, domains of language, changes to language and cognitive-communication following stroke, ST POC, and positive prognostic indicators for communication.   PATIENT EDUCATION: Education details: as above Person educated: Patient, Spouse, and Child(ren) Education method: Explanation Education comprehension: verbalized understanding and needs further education  HOME EXERCISE PROGRAM:   To be given in upcoming sessions as appropriate    GOALS:  Goals reviewed with patient? Yes  SHORT TERM GOALS: Target date: 10 sessions  Pt will participate in further assessment of functional reading/writing. Baseline: Goal status: INITIAL  2.  With Moderate A, patient  will complete a semantic feature analysis with at least 2 relevant features for 8/10 target words to improve word-finding skills.  Baseline:  Goal status: INITIAL  3.  With Maximal A, patient will generate sentences with 3 or more words in response to a situation at 80% accuracy in order to increase ability to communicate basic wants and needs.  Baseline:  Goal status: INITIAL  4.   With Maximal A,  pt will follow 2-step commands for improved  participation in ADLs/iADLs with max cueing. Baseline:  Goal status: INITIAL  5.  Pt will repeat short sentences (<4 words) with good approximations 80% of the time with max cueing. Baseline:  Goal status: INITIAL    LONG TERM GOALS: Target date: 12 weeks  Pt will report a subjective improvement in communication per PROM.  Baseline: CES 15/32 on 06/07/23 Goal status: INITIAL  2.  With Min A, patient/family will demonstrate understanding of the following concepts: aphasia, spontaneous recovery, communication vs conversation, strengths/strategies to promote success, local resources in order to increase patient's participation in medical care.    Baseline:  Goal status: INITIAL   ASSESSMENT:  CLINICAL IMPRESSION: Pt is a 78 y.o. female who presents for communication evaluation in setting on stroke. Pt in ED 1/24-1/25/25 for stroke. Pt d/c'd home with HH ST. PMHx significant of  HTN, CKD stage IIIB, DMT2 on insulin, CAD, chronic smoking, chronic respiratory failure with COPD and on home oxygen, anxiety, HLD. Assessment completed via functional/dynamic means including selected subtests of Western Aphasia Battery Revised (WAB-R) and PROM (Communication Effectiveness Scale). 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 Pt with halting, telegraphic speech, mostly single words, paraphasias, neologisms, severe wordfinding difficulty. Similar difficulty noted with confrontation naming and divergent naming. Auditory comprehension is a relatively strength for pt as pt with intact single word recognition and ability to answer simple and complex yes/no questions. Increased difficulty noted with pt's ability to follow 2-step (moderately complex and complex) commands. Pt with impaired repetition at the word, phase, and sentence level. 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 Recommend course of ST targeting functional communication, further assessment of  functional reading/writing, and pt/caregiver training to help promote QoL and overall life participation.   OBJECTIVE IMPAIRMENTS include expressive language, receptive language, and aphasia. These impairments are limiting patient from ADLs/IADLs and effectively communicating at home and in community. Factors affecting potential to achieve goals and functional outcome are severity of impairments. Patient will benefit from skilled SLP services to address above impairments and improve overall function.  REHAB POTENTIAL: Good  PLAN: SLP FREQUENCY: 2x/week  SLP DURATION: 12 weeks  PLANNED INTERVENTIONS: Language facilitation, Cueing hierachy, Internal/external aids, Functional tasks, Multimodal communication approach, SLP instruction and feedback, Compensatory strategies, and Patient/family education    Dia Forget, M.S., CCC-SLP Speech-Language Pathologist [place] [facility] [phone] [place])  [place] [facility] at [facility] [address] [place], [place], [address] Phone: [phone]   Fax:  [phone]

## 2023-06-09 ENCOUNTER — Other Ambulatory Visit: Payer: Self-pay | Admitting: Family Medicine

## 2023-06-09 DIAGNOSIS — J984 Other disorders of lung: Secondary | ICD-10-CM

## 2023-06-13 ENCOUNTER — Ambulatory Visit

## 2023-06-13 DIAGNOSIS — R41841 Cognitive communication deficit: Secondary | ICD-10-CM

## 2023-06-13 DIAGNOSIS — R471 Dysarthria and anarthria: Secondary | ICD-10-CM | POA: Diagnosis not present

## 2023-06-13 DIAGNOSIS — R4701 Aphasia: Secondary | ICD-10-CM

## 2023-06-13 DIAGNOSIS — I69890 Apraxia following other cerebrovascular disease: Secondary | ICD-10-CM | POA: Diagnosis not present

## 2023-06-13 DIAGNOSIS — R4702 Dysphasia: Secondary | ICD-10-CM | POA: Diagnosis not present

## 2023-06-13 NOTE — Therapy (Signed)
 OUTPATIENT SPEECH LANGUAGE PATHOLOGY APHASIA TREATMENT   Patient Name: Hannah Kirby MRN: [id number] DOB:Nov 19, 1945, 78 y.o., female Today's Date: 06/13/2023  PCP: Jeralene Mom, MD REFERRING PROVIDER: Jeralene Mom, MD   End of Session - 06/13/23 1224     Visit Number 2    Number of Visits 24    Date for SLP Re-Evaluation 08/30/23    SLP Start Time 1100    SLP Stop Time  1200    SLP Time Calculation (min) 60 min    Activity Tolerance Patient tolerated treatment well             Past Medical History:  Diagnosis Date   Acute hemorrhagic colitis 08/08/2019   Anemia    Anxiety    Arthritis    C. difficile colitis 08/09/2019   CAD (coronary artery disease)    a. 02/2006 PCI: BMS x 2 to RCA, cath o/w without significant coronary disease; b. nuclear stress test 07/2014: No ischemia/infarct; c. 11/2017 MV: no isch/infarct, EF 55-65%; d. 03/2020 NSTEMI/PCI: LM nl, LAD min irregs, RI 25, small, LCX nl, RCA 30p/m ISR, 99d (3.0x15 Resolute Onyx DES).   Cerebrovascular accident (CVA) (HCC) 03/18/2023   dysarthria   Chronic bronchitis (HCC)    secondary to cigarette smoking   Chronic kidney disease (CKD), stage III (moderate) (HCC)    COPD (chronic obstructive pulmonary disease) (HCC)    Diabetes mellitus    Diastolic dysfunction    a. 07/2019 Echo: EF 60-65%, no rwma, mod LVH. Nl RV size/fxn; b. 03/2020 Echo: EF 60-65%, mod LVH, gr1 DD, nl RV size/fxn, mild AS.   FHx: allergies    GERD (gastroesophageal reflux disease)    Goiter    Granulomatous disease (HCC)    Hernia    Hyperlipidemia    Hypertension    Kidney stone on left side 2013   Microalbuminuria    NSTEMI (non-ST elevated myocardial infarction) (HCC) 03/23/2020   Obesity    Panic attacks    PVC's (premature ventricular contractions)    a. 03/2018 Zio: Occas PVCs (2.5%). Triggered events assoc w/ PVC/PAC.   Retinal tear 2020   Smokers' cough (HCC)    Spinal stenosis    Stroke (HCC) 10/29/2016   mild left side  weakness   Stroke (HCC) 08/01/2019   Tobacco abuse    Past Surgical History:  Procedure Laterality Date   ABDOMINAL HYSTERECTOMY     BREAST SURGERY     CATARACT EXTRACTION W/PHACO Right 03/01/2017   Procedure: CATARACT EXTRACTION PHACO AND INTRAOCULAR LENS PLACEMENT (IOC) RIGHT DIABETIC;  Surgeon: Annell Kidney, MD;  Location: [facility];  Service: Ophthalmology;  Laterality: Right;   CATARACT EXTRACTION W/PHACO Left 03/22/2017   Procedure: CATARACT EXTRACTION PHACO AND INTRAOCULAR LENS PLACEMENT (IOC) LEFT DIABETIC;  Surgeon: Annell Kidney, MD;  Location: [facility];  Service: Ophthalmology;  Laterality: Left;  Diabetic - insulin  and oral meds   COLONOSCOPY WITH PROPOFOL  N/A 09/23/2014   Procedure: COLONOSCOPY WITH PROPOFOL ;  Surgeon: Deveron Fly, MD;  Location: [facility];  Service: Endoscopy;  Laterality: N/A;   COLONOSCOPY WITH PROPOFOL  N/A 01/11/2018   Procedure: COLONOSCOPY WITH PROPOFOL ;  Surgeon: Deveron Fly, MD;  Location: [facility] END[facility]COPY;  Service: Endoscopy;  Laterality: N/A;   COLONOSCOPY WITH PROPOFOL  N/A 04/24/2018   Procedure: COLONOSCOPY WITH PROPOFOL ;  Surgeon: Deveron Fly, MD;  Location: [facility] ENDOSCOPY;  Service: Endoscopy;  Laterality: N/A;   COLONOSCOPY WITH PROPOFOL  N/A 11/19/2019   Procedure: COLONOSCOPY WITH PROPOFOL ;  Surgeon: Ole Berkeley,  Darren, MD;  Location: [facility] ENDOSCOPY;  Service: Endoscopy;  Laterality: N/A;   CORONARY ANGIOPLASTY WITH STENT PLACEMENT  2008   CORONARY STENT INTERVENTION N/A 03/24/2020   Procedure: CORONARY STENT INTERVENTION;  Surgeon: Sammy Crisp, MD;  Location: [facility] INVASIVE CV LAB;  Service: Cardiovascular;  Laterality: N/A;   ESOPHAGOGASTRODUODENOSCOPY (EGD) WITH PROPOFOL  N/A 12/30/2014   Procedure: ESOPHAGOGASTRODUODENOSCOPY (EGD) WITH PROPOFOL ;  Surgeon: Deveron Fly, MD;  Location: [facility]COPY;  Service: Endoscopy;  Laterality: N/A;   ESOPHAGOGASTRODUODENOSCOPY (EGD) WITH PROPOFOL  N/A  07/19/2016   Procedure: ESOPHAGOGASTRODUODENOSCOPY (EGD) WITH PROPOFOL ;  Surgeon: Deveron Fly, MD;  Location: [facility] ENDOSCOPY;  Service: Endoscopy;  Laterality: N/A;   ESOPHAGOGASTRODUODENOSCOPY (EGD) WITH PROPOFOL  N/A 04/24/2018   Procedure: ESOPHAGOGASTRODUODENOSCOPY (EGD) WITH PROPOFOL ;  Surgeon: Deveron Fly, MD;  Location: [facility] ENDOSCOPY;  Service: Endoscopy;  Laterality: N/A;   hysterectomy (other)     LEFT HEART CATH AND CORONARY ANGIOGRAPHY N/A 03/24/2020   Procedure: LEFT HEART CATH AND CORONARY ANGIOGRAPHY;  Surgeon: Sammy Crisp, MD;  Location: [facility] INVASIVE CV LAB;  Service: Cardiovascular;  Laterality: N/A;   Patient Active Problem List   Diagnosis Date Noted   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) 03/27/2023   Dysarthria 03/17/2023   Chronic obstructive pulmonary disease (HCC) 03/17/2023   Dizziness 02/23/2023   Dehydration 02/20/2023   Restrictive airway disease 07/21/2020   Partial thickness rotator cuff tear 03/20/2020   Shoulder pain 03/05/2020   Retinal detachment, left 03/04/2020   Ataxia 02/28/2020   Difficulty walking 02/28/2020   Physical deconditioning 02/28/2020   Chronic diastolic congestive heart failure (HCC) 11/20/2019   Polyp of ascending colon    Hypokalemia 08/08/2019   TIA (transient ischemic attack) 08/01/2019   Benign hypertensive kidney disease with chronic kidney disease 04/25/2019   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   Chronic, continuous use of opioids 03/26/2018   History of adenomatous polyp of colon 01/22/2018   Ulceration of colon determined by endoscopy 01/22/2018   Diverticulosis of colon 01/22/2018   History of CVA (cerebrovascular accident) 11/08/2016   Weakness of left upper extremity 10/30/2016   AAA (abdominal aortic aneurysm) without rupture (HCC) 08/12/2016   Barrett esophagus  07/21/2016   Stage 3b chronic kidney disease (HCC) 07/27/2015   Chest pain at rest 05/06/2015   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HCC) 10/28/2014   Solitary pulmonary nodule on lung CT 08/20/2014   Allergic rhinitis 07/28/2014   CAFL (chronic airflow limitation) (HCC) 07/28/2014   Acid reflux 07/28/2014   Granuloma annulare    Back pain 11/12/2013   Lumbar scoliosis 10/30/2013   Lumbar radiculopathy 10/30/2013   Lumbar canal stenosis 10/30/2013   Calcium  blood increased 10/22/2013   Proteinuria 10/22/2013   Obesity 03/21/2011   Smokes with greater than 30 pack year history 03/21/2011   Edema 08/18/2010   Hyperlipidemia 03/25/2009   Coronary artery disease 03/25/2009   Primary hypertension 03/25/2009    ONSET DATE: 03/17/23 (admitted with stroke), 05/23/23 (referral date)  REFERRING DIAG:  R41.841 (ICD-10-CM) - Cognitive communication deficit  I63.9 (ICD-10-CM) - CVA (cerebral vascular accident) (HCC)  R47.02 (ICD-10-CM) - Dysphasia    THERAPY DIAG:  Aphasia  Dysarthria and anarthria  Cognitive communication deficit  Rationale for Evaluation and Treatment Rehabilitation  SUBJECTIVE:   SUBJECTIVE STATEMENT: Pt alert, pleasant, and cooperative. On home O2.  Pt accompanied by: self, significant other, and family member  PERTINENT HISTORY:  Pt is a 78 y.o. female who presents for communication evaluation in setting on stroke. Pt in ED 1/24-1/25/25. Pt d/c'd home with HH ST. PMHx significant of   HTN, CKD stage IIIB, DMT2 on insulin , CAD, chronic smoking, chronic respiratory failure with COPD and on home oxygen , anxiety, HLD. MRI 03/17/23 "1. Punctate foci of restricted diffusion in the left posterior  frontal white matter, most likely acute infarcts.  2. Numerous foci of hemosiderin deposition, which are seen in the  deep gray structures but also in the bilateral cerebral hemispheres,  with 1 new focus compared to 02/20/2023. While this could be the  sequela of  chronic hypertensive microhemorrhages, this appearance is  concerning for cerebral amyloid angiopathy."   PAIN:  Are you having pain? No  FALLS: defer to PT; under care of HH PT  LIVING ENVIRONMENT: Lives with: lives with their spouse Lives in: House/apartment  PLOF:  Level of assistance: Independent with ADLs Employment: Retired   PATIENT GOALS    for communication to improve   OBJECTIVE:  TODAY'S TREATMENT:  STANDARDIZED ASSESSMENTS: WAB-R Part II   Reading:  A. Comprehension of Sentences                    24/40 B. Reading Commands                                  6/20 C. Written word-object choice matching         6/6 D. Written word-picture choice matching       6/6 E. Picture-written word choice matching        6/6 F. Spoken word-written choice matching       4/4 G. Letter discrimination                                   4/6 H. Spelled word recognition                            2/4 I.  Spelling                                                       5/6 Reading Total                                                  63/100   Writing:                         A. Writing upon request                                  5.5/6                         B. Writing output  3/34                         C. Writing to dictation                                      8/10                         D. Writing dictated words                                8/10                         E. Alphabet and numbers                               10/22.5                         F. Dictated Letters and numbers                    5.5/7.5                          G. Copying a sentence                                   6/10                         Writing Total:                                                  46/100   Introduced semantic features analysis. Pt required max cues, reducing to min-mod cues to provide x3 features for common objects.    Introduced [employer] for [employer]. Pt set up with an account.   PATIENT EDUCATION: Education details: as above Person educated: Patient, Spouse, and Child(ren) Education method: Explanation Education comprehension: verbalized understanding and needs further education  HOME EXERCISE PROGRAM:   On [employer] Therapy    GOALS:  Goals reviewed with patient? Yes  SHORT TERM GOALS: Target date: 10 sessions  Pt will participate in further assessment of functional reading/writing. Baseline: Goal status: INITIAL  2.  With Moderate A, patient will complete a semantic feature analysis with at least 2 relevant features for 8/10 target words to improve word-finding skills.  Baseline:  Goal status: INITIAL  3.  With Maximal A, patient will generate sentences with 3 or more words in response to a situation at 80% accuracy in order to increase ability to communicate basic wants and needs.  Baseline:  Goal status: INITIAL  4.   With Maximal A,  pt will follow 2-step commands for improved participation in ADLs/iADLs with max cueing. Baseline:  Goal status: INITIAL  5.  Pt will repeat short sentences (<4 words) with good approximations 80% of the time with max cueing. Baseline:  Goal status: INITIAL  LONG TERM GOALS: Target date: 12 weeks  Pt will report a subjective improvement in communication per PROM.  Baseline: CES 15/32 on 06/07/23 Goal status: INITIAL  2.  With Min A, patient/family will demonstrate understanding of the following concepts: aphasia, spontaneous recovery, communication vs conversation, strengths/strategies to promote success, local resources in order to increase patient's participation in medical care.    Baseline:  Goal status: INITIAL   ASSESSMENT:  CLINICAL IMPRESSION: Pt is a 78 y.o. female who presents for communication evaluation in setting on stroke. Pt in ED 1/24-1/25/25 for stroke. Pt d/c'd home with HH ST. PMHx significant of  HTN, CKD stage  IIIB, DMT2 on insulin , CAD, chronic smoking, chronic respiratory failure with COPD and on home oxygen , anxiety, HLD. Assessment completed via functional/dynamic means including selected subtests of Western Aphasia Battery Revised (WAB-R) and PROM (Communication Effectiveness Scale). 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 Pt with halting, telegraphic speech, mostly single words, paraphasias, neologisms, severe wordfinding difficulty. Similar difficulty noted with confrontation naming and divergent naming. Auditory comprehension is a relatively strength for pt as pt with intact single word recognition and ability to answer simple and complex yes/no questions. Increased difficulty noted with pt's ability to follow 2-step (moderately complex and complex) commands. Pt with impaired repetition at the word, phase, and sentence level. 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 Recommend course of ST targeting functional communication, further assessment of functional reading/writing, and pt/caregiver training to help promote QoL and overall life participation.   OBJECTIVE IMPAIRMENTS include expressive language, receptive language, and aphasia. These impairments are limiting patient from ADLs/IADLs and effectively communicating at home and in community. Factors affecting potential to achieve goals and functional outcome are severity of impairments. Patient will benefit from skilled SLP services to address above impairments and improve overall function.  REHAB POTENTIAL: Good  PLAN: SLP FREQUENCY: 2x/week  SLP DURATION: 12 weeks  PLANNED INTERVENTIONS: Language facilitation, Cueing hierachy, Internal/external aids, Functional tasks, Multimodal communication approach, SLP instruction and feedback, Compensatory strategies, and Patient/family education    Dia Forget, M.S., CCC-SLP Speech-Language Pathologist [facility] [phone] [address] [place])  [facility] [address] [place], [place], [address] Phone: [phone]   Fax:  [phone]

## 2023-06-13 NOTE — Telephone Encounter (Signed)
 Copied from CRM [id number]. Topic: Clinical - Home Health Verbal Orders >> Jun 13, 2023  9:41 AM Turkey B wrote: Caller/Agency: dena pt daughter Gracelyn Laurence Number: [phone] Service Requested: Physical Therapy Frequency: once a week Any new concerns about the patient? no

## 2023-06-15 ENCOUNTER — Ambulatory Visit

## 2023-06-15 DIAGNOSIS — I69890 Apraxia following other cerebrovascular disease: Secondary | ICD-10-CM | POA: Diagnosis not present

## 2023-06-15 DIAGNOSIS — R4701 Aphasia: Secondary | ICD-10-CM | POA: Diagnosis not present

## 2023-06-15 DIAGNOSIS — R4702 Dysphasia: Secondary | ICD-10-CM | POA: Diagnosis not present

## 2023-06-15 DIAGNOSIS — R41841 Cognitive communication deficit: Secondary | ICD-10-CM | POA: Diagnosis not present

## 2023-06-15 DIAGNOSIS — R471 Dysarthria and anarthria: Secondary | ICD-10-CM | POA: Diagnosis not present

## 2023-06-15 NOTE — Therapy (Signed)
 OUTPATIENT SPEECH LANGUAGE PATHOLOGY APHASIA TREATMENT   Patient Name: Hannah Kirby MRN: [id number] DOB:1945-10-27, 78 y.o., female Today's Date: 06/15/2023  PCP: Jeralene Mom, MD REFERRING PROVIDER: Jeralene Mom, MD   End of Session - 06/15/23 1624     Visit Number 3    Number of Visits 24    Date for SLP Re-Evaluation 08/30/23    SLP Start Time 1530    SLP Stop Time  1620    SLP Time Calculation (min) 50 min             Past Medical History:  Diagnosis Date   Acute hemorrhagic colitis 08/08/2019   Anemia    Anxiety    Arthritis    C. difficile colitis 08/09/2019   CAD (coronary artery disease)    a. 02/2006 PCI: BMS x 2 to RCA, cath o/w without significant coronary disease; b. nuclear stress test 07/2014: No ischemia/infarct; c. 11/2017 MV: no isch/infarct, EF 55-65%; d. 03/2020 NSTEMI/PCI: LM nl, LAD min irregs, RI 25, small, LCX nl, RCA 30p/m ISR, 99d (3.0x15 Resolute Onyx DES).   Cerebrovascular accident (CVA) (HCC) 03/18/2023   dysarthria   Chronic bronchitis (HCC)    secondary to cigarette smoking   Chronic kidney disease (CKD), stage III (moderate) (HCC)    COPD (chronic obstructive pulmonary disease) (HCC)    Diabetes mellitus    Diastolic dysfunction    a. 07/2019 Echo: EF 60-65%, no rwma, mod LVH. Nl RV size/fxn; b. 03/2020 Echo: EF 60-65%, mod LVH, gr1 DD, nl RV size/fxn, mild AS.   FHx: allergies    GERD (gastroesophageal reflux disease)    Goiter    Granulomatous disease (HCC)    Hernia    Hyperlipidemia    Hypertension    Kidney stone on left side 2013   Microalbuminuria    NSTEMI (non-ST elevated myocardial infarction) (HCC) 03/23/2020   Obesity    Panic attacks    PVC's (premature ventricular contractions)    a. 03/2018 Zio: Occas PVCs (2.5%). Triggered events assoc w/ PVC/PAC.   Retinal tear 2020   Smokers' cough (HCC)    Spinal stenosis    Stroke (HCC) 10/29/2016   mild left side weakness   Stroke (HCC) 08/01/2019   Tobacco abuse     Past Surgical History:  Procedure Laterality Date   ABDOMINAL HYSTERECTOMY     BREAST SURGERY     CATARACT EXTRACTION W/PHACO Right 03/01/2017   Procedure: CATARACT EXTRACTION PHACO AND INTRAOCULAR LENS PLACEMENT (IOC) RIGHT DIABETIC;  Surgeon: Annell Kidney, MD;  Location: [facility];  Service: Ophthalmology;  Laterality: Right;   CATARACT EXTRACTION W/PHACO Left 03/22/2017   Procedure: CATARACT EXTRACTION PHACO AND INTRAOCULAR LENS PLACEMENT (IOC) LEFT DIABETIC;  Surgeon: Annell Kidney, MD;  Location: [facility];  Service: Ophthalmology;  Laterality: Left;  Diabetic - insulin  and oral meds   COLONOSCOPY WITH PROPOFOL  N/A 09/23/2014   Procedure: COLONOSCOPY WITH PROPOFOL ;  Surgeon: Deveron Fly, MD;  Location: [facility] ENDOSCOPY;  Service: Endoscopy;  Laterality: N/A;   COLONOSCOPY WITH PROPOFOL  N/A 01/11/2018   Procedure: COLONOSCOPY WITH PROPOFOL ;  Surgeon: Deveron Fly, MD;  Location: [facility] ENDOSCOPY;  Service: Endoscopy;  Laterality: N/A;   COLONOSCOPY WITH PROPOFOL  N/A 04/24/2018   Procedure: COLONOSCOPY WITH PROPOFOL ;  Surgeon: Deveron Fly, MD;  Location: [facility] ENDOSCOPY;  Service: Endoscopy;  Laterality: N/A;   COLONOSCOPY WITH PROPOFOL  N/A 11/19/2019   Procedure: COLONOSCOPY WITH PROPOFOL ;  Surgeon: Marnee Sink, MD;  Location: [facility] ENDOSCOPY;  Service: Endoscopy;  Laterality: N/A;   CORONARY ANGIOPLASTY WITH STENT PLACEMENT  2008   CORONARY STENT INTERVENTION N/A 03/24/2020   Procedure: CORONARY STENT INTERVENTION;  Surgeon: Sammy Crisp, MD;  Location: [facility] INVASIVE CV LAB;  Service: Cardiovascular;  Laterality: N/A;   ESOPHAGOGASTRODUODENOSCOPY (EGD) WITH PROPOFOL  N/A 12/30/2014   Procedure: ESOPHAGOGASTRODUODENOSCOPY (EGD) WITH PROPOFOL ;  Surgeon: Deveron Fly, MD;  Location: [facility] ENDOSCOPY;  Service: Endoscopy;  Laterality: N/A;   ESOPHAGOGASTRODUODENOSCOPY (EGD) WITH PROPOFOL  N/A 07/19/2016   Procedure: ESOPHAGOGASTRODUODENOSCOPY (EGD)  WITH PROPOFOL ;  Surgeon: Deveron Fly, MD;  Location: [facility] ENDOSCOPY;  Service: Endoscopy;  Laterality: N/A;   ESOPHAGOGASTRODUODENOSCOPY (EGD) WITH PROPOFOL  N/A 04/24/2018   Procedure: ESOPHAGOGASTRODUODENOSCOPY (EGD) WITH PROPOFOL ;  Surgeon: Deveron Fly, MD;  Location: [facility] ENDOSCOPY;  Service: Endoscopy;  Laterality: N/A;   hysterectomy (other)     LEFT HEART CATH AND CORONARY ANGIOGRAPHY N/A 03/24/2020   Procedure: LEFT HEART CATH AND CORONARY ANGIOGRAPHY;  Surgeon: Sammy Crisp, MD;  Location: [facility] INVASIVE CV LAB;  Service: Cardiovascular;  Laterality: N/A;   Patient Active Problem List   Diagnosis Date Noted   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) 03/27/2023   Dysarthria 03/17/2023   Chronic obstructive pulmonary disease (HCC) 03/17/2023   Dizziness 02/23/2023   Dehydration 02/20/2023   Restrictive airway disease 07/21/2020   Partial thickness rotator cuff tear 03/20/2020   Shoulder pain 03/05/2020   Retinal detachment, left 03/04/2020   Ataxia 02/28/2020   Difficulty walking 02/28/2020   Physical deconditioning 02/28/2020   Chronic diastolic congestive heart failure (HCC) 11/20/2019   Polyp of ascending colon    Hypokalemia 08/08/2019   TIA (transient ischemic attack) 08/01/2019   Benign hypertensive kidney disease with chronic kidney disease 04/25/2019   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   Chronic, continuous use of opioids 03/26/2018   History of adenomatous polyp of colon 01/22/2018   Ulceration of colon determined by endoscopy 01/22/2018   Diverticulosis of colon 01/22/2018   History of CVA (cerebrovascular accident) 11/08/2016   Weakness of left upper extremity 10/30/2016   AAA (abdominal aortic aneurysm) without rupture (HCC) 08/12/2016   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   Stage 3b chronic kidney disease (HCC)  07/27/2015   Chest pain at rest 05/06/2015   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HCC) 10/28/2014   Solitary pulmonary nodule on lung CT 08/20/2014   Allergic rhinitis 07/28/2014   CAFL (chronic airflow limitation) (HCC) 07/28/2014   Acid reflux 07/28/2014   Granuloma annulare    Back pain 11/12/2013   Lumbar scoliosis 10/30/2013   Lumbar radiculopathy 10/30/2013   Lumbar canal stenosis 10/30/2013   Calcium  blood increased 10/22/2013   Proteinuria 10/22/2013   Obesity 03/21/2011   Smokes with greater than 30 pack year history 03/21/2011   Edema 08/18/2010   Hyperlipidemia 03/25/2009   Coronary artery disease 03/25/2009   Primary hypertension 03/25/2009    ONSET DATE: 03/17/23 (admitted with stroke), 05/23/23 (referral date)  REFERRING DIAG:  R41.841 (ICD-10-CM) - Cognitive communication deficit  I63.9 (ICD-10-CM) - CVA (cerebral vascular accident) (HCC)  R47.02 (ICD-10-CM) - Dysphasia    THERAPY DIAG:  Aphasia  Apraxia following other cerebrovascular disease  Rationale for Evaluation and Treatment Rehabilitation  SUBJECTIVE:   SUBJECTIVE STATEMENT: Pt alert, pleasant, and cooperative. On home O2.  Pt accompanied by: self, family member  PERTINENT HISTORY:  Pt is a 78 y.o. female who presents for communication evaluation in setting on  stroke. Pt in ED 1/24-1/25/25. Pt d/c'd home with HH ST. PMHx significant of   HTN, CKD stage IIIB, DMT2 on insulin , CAD, chronic smoking, chronic respiratory failure with COPD and on home oxygen , anxiety, HLD. MRI 03/17/23 "1. Punctate foci of restricted diffusion in the left posterior  frontal white matter, most likely acute infarcts.  2. Numerous foci of hemosiderin deposition, which are seen in the  deep gray structures but also in the bilateral cerebral hemispheres,  with 1 new focus compared to 02/20/2023. 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 No  FALLS: defer to PT; under care of HH PT  LIVING ENVIRONMENT: Lives with: lives with their spouse Lives in: House/apartment  PLOF:  Level of assistance: Independent with ADLs Employment: Retired   PATIENT GOALS    for communication to improve   OBJECTIVE:  TODAY'S TREATMENT:   Utilized TalkPath therapy app to facilitate today's session.   Pt repeated 2 word phrases with 100% accuracy, extra time. 3-4 word phrases with 60% accuracy indep, improving to 100% with mod/max verbal hierarchical cueing.   Reviewed semantic features analysis. Pt required max cues, reducing to min-mod cues to provide x3 features for common objects.   Introduced supported Special educational needs teacher. Pt able to answer questions re: preferred snacks, family, and dog with use of supported communication. Will continue to utilize in upcoming sessions.   PATIENT EDUCATION: Education details: as above Person educated: Patient and Child(ren) Education method: Explanation Education comprehension: verbalized understanding and needs further education  HOME EXERCISE PROGRAM:   TalkPath Therapy - on computer - if able to access  Will plan to flag worksheets in pt's workbooks next session  Practice SFA, supported communication   GOALS:  Goals reviewed with patient? Yes  SHORT TERM GOALS: Target date: 10 sessions  Pt will participate in further assessment of functional reading/writing. Baseline: Goal status: INITIAL  2.  With Moderate A, patient will complete a semantic feature analysis with at least 2 relevant features for 8/10 target words to improve word-finding skills.  Baseline:  Goal status: INITIAL  3.  With Maximal A, patient will generate sentences with 3 or more words in response to a situation at 80% accuracy in order to increase ability to communicate basic wants and needs.  Baseline:  Goal status: INITIAL  4.   With Maximal A,  pt will follow  2-step commands for improved participation in ADLs/iADLs with max cueing. Baseline:  Goal status: INITIAL  5.  Pt will repeat short sentences (<4 words) with good approximations 80% of the time with max cueing. Baseline:  Goal status: INITIAL    LONG TERM GOALS: Target date: 12 weeks  Pt will report a subjective improvement in communication per PROM.  Baseline: CES 15/32 on 06/07/23 Goal status: INITIAL  2.  With Min A, patient/family will demonstrate understanding of the following concepts: aphasia, spontaneous recovery, communication vs conversation, strengths/strategies to promote success, local resources in order to increase patient's participation in medical care.    Baseline:  Goal status: INITIAL   ASSESSMENT:  CLINICAL IMPRESSION: Pt is a 78 y.o. female who presents for communication evaluation in setting on stroke. Pt in ED 1/24-1/25/25 for stroke. Pt d/c'd home with HH ST. PMHx significant of  HTN, CKD stage IIIB, DMT2 on insulin , CAD, chronic smoking, chronic respiratory failure with COPD and on home oxygen , anxiety, HLD. Assessment completed via  functional/dynamic means including selected subtests of Western Aphasia Battery Revised (WAB-R) and PROM (Communication Effectiveness Scale). 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 Pt with halting, telegraphic speech, mostly single words, paraphasias, neologisms, severe wordfinding difficulty. Similar difficulty noted with confrontation naming and divergent naming. Auditory comprehension is a relatively strength for pt as pt with intact single word recognition and ability to answer simple and complex yes/no questions. Increased difficulty noted with pt's ability to follow 2-step (moderately complex and complex) commands. Pt with impaired repetition at the word, phase, and sentence level. 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 Recommend course of ST targeting functional  communication, further assessment of functional reading/writing, and pt/caregiver training to help promote QoL and overall life participation.   OBJECTIVE IMPAIRMENTS include expressive language, receptive language, and aphasia. These impairments are limiting patient from ADLs/IADLs and effectively communicating at home and in community. Factors affecting potential to achieve goals and functional outcome are severity of impairments. Patient will benefit from skilled SLP services to address above impairments and improve overall function.  REHAB POTENTIAL: Good  PLAN: SLP FREQUENCY: 2x/week  SLP DURATION: 12 weeks  PLANNED INTERVENTIONS: Language facilitation, Cueing hierachy, Internal/external aids, Functional tasks, Multimodal communication approach, SLP instruction and feedback, Compensatory strategies, and Patient/family education    Dia Forget, M.S., CCC-SLP Speech-Language Pathologist [facility] [id number] [address] [place])  [facility] at [facility] [address] [place], [place], [address] Phone: [phone]   Fax:  [phone]

## 2023-06-20 ENCOUNTER — Ambulatory Visit

## 2023-06-20 DIAGNOSIS — R41841 Cognitive communication deficit: Secondary | ICD-10-CM | POA: Diagnosis not present

## 2023-06-20 DIAGNOSIS — R4701 Aphasia: Secondary | ICD-10-CM | POA: Diagnosis not present

## 2023-06-20 DIAGNOSIS — I69890 Apraxia following other cerebrovascular disease: Secondary | ICD-10-CM | POA: Diagnosis not present

## 2023-06-20 DIAGNOSIS — R471 Dysarthria and anarthria: Secondary | ICD-10-CM | POA: Diagnosis not present

## 2023-06-20 DIAGNOSIS — R4702 Dysphasia: Secondary | ICD-10-CM | POA: Diagnosis not present

## 2023-06-20 NOTE — Therapy (Signed)
 OUTPATIENT SPEECH LANGUAGE PATHOLOGY APHASIA TREATMENT   Patient Name: Hannah Kirby MRN: [id number] DOB:[birth year], 78 y.o., female Today's Date: 06/20/2023  PCP: Hannah Mom, MD REFERRING PROVIDER: Jeralene Mom, MD   End of Session - 06/20/23 1204     Activity Tolerance Patient tolerated treatment well             Past Medical History:  Diagnosis Date   Acute hemorrhagic colitis 08/08/2019   Anemia    Anxiety    Arthritis    C. difficile colitis 08/09/2019   CAD (coronary artery disease)    a. 02/2006 PCI: BMS x 2 to RCA, cath o/w without significant coronary disease; b. nuclear stress test 07/2014: No ischemia/infarct; c. 11/2017 MV: no isch/infarct, EF 55-65%; d. 03/2020 NSTEMI/PCI: LM nl, LAD min irregs, RI 25, small, LCX nl, RCA 30p/m ISR, 99d (3.0x15 Resolute Onyx DES).   Cerebrovascular accident (CVA) (HCC) 03/18/2023   dysarthria   Chronic bronchitis (HCC)    secondary to cigarette smoking   Chronic kidney disease (CKD), stage III (moderate) (HCC)    COPD (chronic obstructive pulmonary disease) (HCC)    Diabetes mellitus    Diastolic dysfunction    a. 07/2019 Echo: EF 60-65%, no rwma, mod LVH. Nl RV size/fxn; b. 03/2020 Echo: EF 60-65%, mod LVH, gr1 DD, nl RV size/fxn, mild AS.   FHx: allergies    GERD (gastroesophageal reflux disease)    Goiter    Granulomatous disease (HCC)    Hernia    Hyperlipidemia    Hypertension    Kidney stone on left side 2013   Microalbuminuria    NSTEMI (non-ST elevated myocardial infarction) (HCC) 03/23/2020   Obesity    Panic attacks    PVC's (premature ventricular contractions)    a. 03/2018 Zio: Occas PVCs (2.5%). Triggered events assoc w/ PVC/PAC.   Retinal tear 2020   Smokers' cough (HCC)    Spinal stenosis    Stroke (HCC) 10/29/2016   mild left side weakness   Stroke (HCC) 08/01/2019   Tobacco abuse    Past Surgical History:  Procedure Laterality Date   ABDOMINAL HYSTERECTOMY     BREAST SURGERY     CATARACT  EXTRACTION W/PHACO Right 03/01/2017   Procedure: CATARACT EXTRACTION PHACO AND INTRAOCULAR LENS PLACEMENT (IOC) RIGHT DIABETIC;  Surgeon: Annell Kidney, MD;  Location: [facility];  Service: Ophthalmology;  Laterality: Right;   CATARACT EXTRACTION W/PHACO Left 03/22/2017   Procedure: CATARACT EXTRACTION PHACO AND INTRAOCULAR LENS PLACEMENT (IOC) LEFT DIABETIC;  Surgeon: Annell Kidney, MD;  Location: [facility];  Service: Ophthalmology;  Laterality: Left;  Diabetic - insulin  and oral meds   COLONOSCOPY WITH PROPOFOL  N/A 09/23/2014   Procedure: COLONOSCOPY WITH PROPOFOL ;  Surgeon: Deveron Fly, MD;  Location: [facility] ENDOSCOPY;  Service: Endoscopy;  Laterality: N/A;   COLONOSCOPY WITH PROPOFOL  N/A 01/11/2018   Procedure: COLONOSCOPY WITH PROPOFOL ;  Surgeon: Deveron Fly, MD;  Location: [facility] ENDOSCOPY;  Service: Endoscopy;  Laterality: N/A;   COLONOSCOPY WITH PROPOFOL  N/A 04/24/2018   Procedure: COLONOSCOPY WITH PROPOFOL ;  Surgeon: Deveron Fly, MD;  Location: [facility] ENDOSCOPY;  Service: Endoscopy;  Laterality: N/A;   COLONOSCOPY WITH PROPOFOL  N/A 11/19/2019   Procedure: COLONOSCOPY WITH PROPOFOL ;  Surgeon: Marnee Sink, MD;  Location: [facility] ENDOSCOPY;  Service: Endoscopy;  Laterality: N/A;   CORONARY ANGIOPLASTY WITH STENT PLACEMENT  2008   CORONARY STENT INTERVENTION N/A 03/24/2020   Procedure: CORONARY STENT INTERVENTION;  Surgeon: Sammy Crisp, MD;  Location: [facility] INVASIVE CV LAB;  Service: Cardiovascular;  Laterality: N/A;   ESOPHAGOGASTRODUODENOSCOPY (EGD) WITH PROPOFOL  N/A 12/30/2014   Procedure: ESOPHAGOGASTRODUODENOSCOPY (EGD) WITH PROPOFOL ;  Surgeon: Deveron Fly, MD;  Location: [facility] ENDOSCOPY;  Service: Endoscopy;  Laterality: N/A;   ESOPHAGOGASTRODUODENOSCOPY (EGD) WITH PROPOFOL  N/A 07/19/2016   Procedure: ESOPHAGOGASTRODUODENOSCOPY (EGD) WITH PROPOFOL ;  Surgeon: Deveron Fly, MD;  Location: [facility] ENDOSCOPY;  Service: Endoscopy;  Laterality: N/A;    ESOPHAGOGASTRODUODENOSCOPY (EGD) WITH PROPOFOL  N/A 04/24/2018   Procedure: ESOPHAGOGASTRODUODENOSCOPY (EGD) WITH PROPOFOL ;  Surgeon: Deveron Fly, MD;  Location: [facility] ENDOSCOPY;  Service: Endoscopy;  Laterality: N/A;   hysterectomy (other)     LEFT HEART CATH AND CORONARY ANGIOGRAPHY N/A 03/24/2020   Procedure: LEFT HEART CATH AND CORONARY ANGIOGRAPHY;  Surgeon: Sammy Crisp, MD;  Location: [facility] INVASIVE CV LAB;  Service: Cardiovascular;  Laterality: N/A;   Patient Active Problem List   Diagnosis Date Noted   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) 03/27/2023   Dysarthria 03/17/2023   Chronic obstructive pulmonary disease (HCC) 03/17/2023   Dizziness 02/23/2023   Dehydration 02/20/2023   Restrictive airway disease 07/21/2020   Partial thickness rotator cuff tear 03/20/2020   Shoulder pain 03/05/2020   Retinal detachment, left 03/04/2020   Ataxia 02/28/2020   Difficulty walking 02/28/2020   Physical deconditioning 02/28/2020   Chronic diastolic congestive heart failure (HCC) 11/20/2019   Polyp of ascending colon    Hypokalemia 08/08/2019   TIA (transient ischemic attack) 08/01/2019   Benign hypertensive kidney disease with chronic kidney disease 04/25/2019   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   Chronic, continuous use of opioids 03/26/2018   History of adenomatous polyp of colon 01/22/2018   Ulceration of colon determined by endoscopy 01/22/2018   Diverticulosis of colon 01/22/2018   History of CVA (cerebrovascular accident) 11/08/2016   Weakness of left upper extremity 10/30/2016   AAA (abdominal aortic aneurysm) without rupture (HCC) 08/12/2016   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   Stage 3b chronic kidney disease (HCC) 07/27/2015   Chest pain at rest 05/06/2015   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HCC) 10/28/2014   Solitary  pulmonary nodule on lung CT 08/20/2014   Allergic rhinitis 07/28/2014   CAFL (chronic airflow limitation) (HCC) 07/28/2014   Acid reflux 07/28/2014   Granuloma annulare    Back pain 11/12/2013   Lumbar scoliosis 10/30/2013   Lumbar radiculopathy 10/30/2013   Lumbar canal stenosis 10/30/2013   Calcium  blood increased 10/22/2013   Proteinuria 10/22/2013   Obesity 03/21/2011   Smokes with greater than 30 pack year history 03/21/2011   Edema 08/18/2010   Hyperlipidemia 03/25/2009   Coronary artery disease 03/25/2009   Primary hypertension 03/25/2009    ONSET DATE: 03/17/23 (admitted with stroke), 05/23/23 (referral date)  REFERRING DIAG:  R41.841 (ICD-10-CM) - Cognitive communication deficit  I63.9 (ICD-10-CM) - CVA (cerebral vascular accident) (HCC)  R47.02 (ICD-10-CM) - Dysphasia    THERAPY DIAG:  Aphasia  Apraxia following other cerebrovascular disease  Rationale for Evaluation and Treatment Rehabilitation  SUBJECTIVE:   SUBJECTIVE STATEMENT: Pt alert, pleasant, and cooperative. On home O2.  Pt accompanied by: self, family member  PERTINENT HISTORY:  Pt is a 78 y.o. female who presents for communication evaluation in setting on stroke. Pt in ED 1/24-1/25/25. Pt d/c'd home with HH ST. PMHx significant of   HTN, CKD stage IIIB, DMT2 on insulin , CAD, chronic smoking, chronic respiratory failure with COPD and on home oxygen , anxiety,  HLD. MRI 03/17/23 "1. Punctate foci of restricted diffusion in the left posterior  frontal white matter, most likely acute infarcts.  2. Numerous foci of hemosiderin deposition, which are seen in the  deep gray structures but also in the bilateral cerebral hemispheres,  with 1 new focus compared to 02/20/2023. While this could be the  sequela of chronic hypertensive microhemorrhages, this appearance is  concerning for cerebral amyloid angiopathy."   PAIN:  Are you having pain? No  FALLS: defer to PT; under care of HH PT  LIVING  ENVIRONMENT: Lives with: lives with their spouse Lives in: House/apartment  PLOF:  Level of assistance: Independent with ADLs Employment: Retired   PATIENT GOALS    for communication to improve   OBJECTIVE:  TODAY'S TREATMENT:   Utilized TalkPath therapy app to facilitate today's session.   Pt repeated 2 word phrases with 100% accuracy, extra time. 3-4 word phrases with 60% accuracy indep, improving to 100% with mod verbal hierarchical cueing and self-correction.   Reviewed semantic features analysis. Pt required max cues, reducing to min-mod cues to provide x3 features for common objects.   Pt able to generate short phrases/sentences to label pictured scenes with 40% accuracy indep, improving to 80% with mod/max cueing.  Introduced supported Special educational needs teacher. Pt able to answer personally relevant questions. Will continue to utilize in upcoming sessions.  [name] noted pt has limited opportunities for communication at home. Pt and daughter provided with community resource, [employer].   PATIENT EDUCATION: Education details: as above Person educated: Patient and Child(ren) Education method: Explanation Education comprehension: verbalized understanding and needs further education  HOME EXERCISE PROGRAM:   TalkPath Therapy - on computer - if able to access  Worksheets flagged in workbooks from home  Practice SFA, supported communication   GOALS:  Goals reviewed with patient? Yes  SHORT TERM GOALS: Target date: 10 sessions  Pt will participate in further assessment of functional reading/writing. Baseline: Goal status: INITIAL  2.  With Moderate A, patient will complete a semantic feature analysis with at least 2 relevant features for 8/10 target words to improve word-finding skills.  Baseline:  Goal status: INITIAL  3.  With Maximal A, patient will generate sentences with 3 or more words in response to a situation at 80% accuracy in order to  increase ability to communicate basic wants and needs.  Baseline:  Goal status: INITIAL  4.   With Maximal A,  pt will follow 2-step commands for improved participation in ADLs/iADLs with max cueing. Baseline:  Goal status: INITIAL  5.  Pt will repeat short sentences (<4 words) with good approximations 80% of the time with max cueing. Baseline:  Goal status: INITIAL    LONG TERM GOALS: Target date: 12 weeks  Pt will report a subjective improvement in communication per PROM.  Baseline: CES 15/32 on 06/07/23 Goal status: INITIAL  2.  With Min A, patient/family will demonstrate understanding of the following concepts: aphasia, spontaneous recovery, communication vs conversation, strengths/strategies to promote success, local resources in order to increase patient's participation in medical care.    Baseline:  Goal status: INITIAL   ASSESSMENT:  CLINICAL IMPRESSION: Pt is a 78 y.o. female who presents for communication evaluation in setting on stroke. Pt in ED 1/24-1/25/25 for stroke. Pt d/c'd home with HH ST. PMHx significant of  HTN, CKD stage IIIB, DMT2 on insulin , CAD, chronic smoking, chronic respiratory failure with COPD and on home oxygen , anxiety, HLD. Assessment completed via functional/dynamic means including selected  subtests of Western Aphasia Battery Revised (WAB-R) and PROM (Communication Effectiveness Scale). 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 Pt with halting, telegraphic speech, mostly single words, paraphasias, neologisms, severe wordfinding difficulty. Similar difficulty noted with confrontation naming and divergent naming. Auditory comprehension is a relatively strength for pt as pt with intact single word recognition and ability to answer simple and complex yes/no questions. Increased difficulty noted with pt's ability to follow 2-step (moderately complex and complex) commands. Pt with impaired repetition at the word, phase, and  sentence level. 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 Recommend course of ST targeting functional communication, further assessment of functional reading/writing, and pt/caregiver training to help promote QoL and overall life participation.   OBJECTIVE IMPAIRMENTS include expressive language, receptive language, and aphasia. These impairments are limiting patient from ADLs/IADLs and effectively communicating at home and in community. Factors affecting potential to achieve goals and functional outcome are severity of impairments. Patient will benefit from skilled SLP services to address above impairments and improve overall function.  REHAB POTENTIAL: Good  PLAN: SLP FREQUENCY: 2x/week  SLP DURATION: 12 weeks  PLANNED INTERVENTIONS: Language facilitation, Cueing hierachy, Internal/external aids, Functional tasks, Multimodal communication approach, SLP instruction and feedback, Compensatory strategies, and Patient/family education    Dia Forget, M.S., CCC-SLP Speech-Language Pathologist [facility] [phone] [place])  [facility] at [facility] [address] [place], [place], [address] Phone: [phone]   Fax:  [phone]

## 2023-06-22 ENCOUNTER — Ambulatory Visit

## 2023-06-22 ENCOUNTER — Ambulatory Visit: Payer: Self-pay

## 2023-06-22 NOTE — Telephone Encounter (Signed)
 Copied from CRM [id number]. Topic: Clinical - Red Word Triage >> Jun 22, 2023  2:34 PM Lesta Rater S wrote: Patient has difficulty breathing, congestion and cough   Chief Complaint: Cough Symptoms: Cough, chest congestion, difficulty breathing  Pertinent Negatives: Patient denies fever Disposition: [] ED /[x] Urgent Care (no appt availability in office) / [] Appointment(In office/virtual)/ []  [facility]/ [] Home Care/ [] Refused Recommended Disposition /[] [facility]/ []  Follow-up with PCP Additional Notes: Patient's daughter called stating that the patient has had a cough with chest congestion for a coupe of weeks. She states that she has been taking Mucinex without relief and is now complaining of some increased difficulty breathing. She states the patient has not had any fevers. I advised that there are no appointments in the office until the 12th and that the patient should be taken to urgent care for evaluation. She understood and is agreeable with this plan.     Reason for Disposition  [1] MILD difficulty breathing (e.g., minimal/no SOB at rest, SOB with walking, pulse <100) AND [2] still present when not coughing  Answer Assessment - Initial Assessment Questions 1. ONSET: "When did the cough begin?"      A couple of weeks  2. SEVERITY: "How bad is the cough today?"      Worsening  3. SPUTUM: "Describe the color of your sputum" (none, dry cough; clear, white, yellow, green)     NO 4. HEMOPTYSIS: "Are you coughing up any blood?" If so ask: "How much?" (flecks, streaks, tablespoons, etc.)     No 5. DIFFICULTY BREATHING: "Are you having difficulty breathing?" If Yes, ask: "How bad is it?" (e.g., mild, moderate, severe)    - MILD: No SOB at rest, mild SOB with walking, speaks normally in sentences, can lie down, no retractions, pulse < 100.    - MODERATE: SOB at rest, SOB with minimal exertion and prefers to sit, cannot lie down flat, speaks in phrases, mild retractions,  audible wheezing, pulse 100-120.    - SEVERE: Very SOB at rest, speaks in single words, struggling to breathe, sitting hunched forward, retractions, pulse > 120      Mild to moderate  6. FEVER: "Do you have a fever?" If Yes, ask: "What is your temperature, how was it measured, and when did it start?"     No 7. CARDIAC HISTORY: "Do you have any history of heart disease?" (e.g., heart attack, congestive heart failure)      No 8. LUNG HISTORY: "Do you have any history of lung disease?"  (e.g., pulmonary embolus, asthma, emphysema)     No 9. PE RISK FACTORS: "Do you have a history of blood clots?" (or: recent major surgery, recent prolonged travel, bedridden)     No 10. OTHER SYMPTOMS: "Do you have any other symptoms?" (e.g., runny nose, wheezing, chest pain)       Chest congestion  Protocols used: Cough - Acute Non-Productive-A-AH

## 2023-06-23 NOTE — Progress Notes (Unsigned)
 Cardiology Clinic Note   Date: 06/26/2023 ID: MARIAVICTORIA ZIKA, DOB 1945/06/25, MRN [id number]  Primary Cardiologist:  Belva Boyden, MD  Chief Complaint   Briget Rodrigue Jons is a 78 y.o. female who presents to the clinic today for follow up after testing.   Patient Profile   Lennis Dynes Denslow is followed by Dr. Gollan for the history outlined below.       Past medical history significant for: CAD. PCI with BMS x 2 to RCA January 2008. LHC 03/24/2020 (NSTEMI): Severe single-vessel CAD with 99% distal RCA stenosis.  Mild, nonobstructive CAD with minimal luminal irregularities involving the LAD and 20 to 30% ostial ramus stenosis.  Patent proximal/mid RCA stents with mild to moderate in-stent restenosis (~30%).  Mildly elevated LVEDP.  PCI with DES 3 x 15 mm to distal RCA. Chronic diastolic heart failure. Echo with bubble study 03/22/2023: EF 60 to 65%.  No RWMA.  Moderate LVH.  Grade I DD.  Normal RV size/function.  Mild aortic valve sclerosis/calcification without stenosis.  Bubble study negative without evidence of interatrial shunt. AAA. AAA duplex 02/11/2022: Abnormal dilatation of proximal abdominal aorta, 3.8 cm.  Prior ultrasound with measurement 3.5 cm December 2022. Carotid artery disease. Carotid ultrasound 03/18/2023: Mild amount of calcified plaque at the level of both carotid bulbs and proximal internal carotid arteries.  Bilateral ICA stenosis <50%. Hypertension. Hyperlipidemia. Lipid panel 04/08/2022: LDL 73, HDL 62, TG 148, total 160. COPD. CVA. 2018 and 2021. MRI brain 03/17/2023: Punctate foci of restricted diffusion in the left posterior frontal white matter, most likely acute infarcts. Numerous foci of hemosiderin deposition, which are seen in the deep gray structures but also in the bilateral cerebral hemispheres, with 1 new focus compared to 02/20/2023. While this could be the sequela of chronic hypertensive microhemorrhages, this appearance is concerning for cerebral  amyloid angiopathy. MRA head 03/18/2023: Strong evidence of severe stenosis of the distal left MCA M1 segment.  Left MCA bifurcation appears patent bedside.  No discrete branch occlusion when compared to 2018.  Positive also for severe stenosis or occlusion of the right ACA in the distal A2/A3 segment new since 2018.  Stable MRA elsewhere no other intracranial artery stenosis identified. 30-day ZIO March 2025: HR 56 to 169 bpm, average 70 bpm.  1 run of NSVT 5 beats max rate 128 bpm.  2 runs of SVT lasting 5 beats max rate 169 bpm.  Rare ectopy.  No sustained arrhythmias. T2DM. CKD stage IIIb. Tobacco abuse.  In summary, Ms. Coffman is a longtime patient of Dr. Gollan.  She has a history of CAD with PCI with BMS x 2 to RCA in January 2008.  Nuclear stress testing June 2016 and October 2019 were normal, low risk studies.  She has a history of mild carotid artery disease.  She undergoes yearly ultrasound for surveillance of AAA with last measurement 3.8 cm in December 2023.  In February 2022 she underwent LHC in the setting of NSTEMI and PCI was performed to RCA as detailed above.  Echo at that time showed EF 60 to 65%, no RWMA, moderate LVH, Grade I DD, normal RV size/function, mild aortic valve sclerosis without stenosis.  Patient was last seen in the office by Dr. Gollan on 08/16/2022 for routine follow-up and preoperative risk assessment prior to lumbar epidural steroid injection.  She was doing well at that time with no cardiac complaints.  Patient presented to [facility] ED on 03/17/2023 with complaints of slurred speech, trouble with words, unsteady  gait that began 2 days prior.  Initial labs: WBC 7.6, hemoglobin 17.7, sodium 142, potassium 3.7, creatinine 1.41, BUN 17, respiratory panel negative for COVID/flu/RSV.  Troponin negative x 2.  Chest x-ray without active cardiopulmonary disease.  MRI brain showed punctate foci of restricted diffusion in the left posterior frontal white matter most likely acute  infarcts (further details above).  Patient was discharged on 03/18/2023 to follow-up with PCP and neurology as an outpatient.  She was instructed to follow-up with cardiology to get an echo to complete stroke workup.   Patient was last seen in the office by me on 03/24/2023 for hospital follow-up.  She had no complaints at that time.  30-day monitor revealed no sustained arrhythmias.     History of Present Illness    Today, patient is accompanied by her daughter. She recently developed a URI with persistent cough. She was seen in urgent care and had a negative chest x-ray. She was started on antibiotic and cough seems to be improving. She is working with outpatient speech therapy and daughter feels her speech is improving. She finished with home PT three weeks ago and they are waiting to have a referral sent to outpatient physical therapy as patient is still weak and daughter feels it has gotten worse since stopping PT. Patient states she is generally weak but more so on left side. Shortness of breath is at baseline. She is on continuous supplemental O2. Otherwise patient has no complaints. She denies lower extremity edema, orthopnea or PND. No chest pain, pressure, or tightness. No palpitations.      ROS: All other systems reviewed and are otherwise negative except as noted in History of Present Illness.  EKGs/Labs Reviewed    EKG Interpretation Date/Time:  Monday Jun 26 2023 14:10:28 EDT Ventricular Rate:  70 PR Interval:  178 QRS Duration:  94 QT Interval:  420 QTC Calculation: 453 R Axis:   0  Text Interpretation: Normal sinus rhythm Possible Left atrial enlargement Cannot rule out Anterior infarct , age undetermined When compared with ECG of 24-Mar-2023 14:25, No significant change was found Confirmed by Morey Ar [phone]) on 06/26/2023 2:13:39 PM   03/17/2023: ALT 19; AST 25 05/10/2023: BUN 37; Creatinine, Ser 1.47; Potassium 4.6; Sodium 140   03/17/2023: Hemoglobin 17.7; WBC 7.6    09/16/2022: TSH 2.630     Physical Exam    VS:  BP 115/64 (BP Location: Left Arm, Patient Position: Sitting, Cuff Size: Normal)   Pulse 70   Ht 5\' 4"  (1.626 m)   Wt 160 lb 3.2 oz (72.7 kg)   SpO2 99%   BMI 27.50 kg/m  , BMI Body mass index is 27.5 kg/m.  GEN: Well nourished, well developed, in no acute distress. Neck: No JVD or carotid bruits. Cardiac:  RRR. No murmurs. No rubs or gallops.   Respiratory:  Respirations regular and unlabored. Clear to auscultation without rales, wheezing or rhonchi. GI: Soft, nontender, nondistended. Extremities: Radials/DP/PT 2+ and equal bilaterally. No clubbing or cyanosis. No edema.  Skin: Warm and dry, no rash. Neuro: Strength intact.  Assessment & Plan   CAD S/p PCI with BMS x 2 to RCA January 2018, PCI with DES to distal RCA February 2022.  Patient denies chest pain, pressure or tightness. EKG without acute changes.  -Continue aspirin , Plavix , rosuvastatin , Zetia , metoprolol , as needed SL NTG.   Chronic diastolic heart failure Echo 03/22/2023 showed normal LV/RV function, moderate LVH, Grade I DD, aortic valve sclerosis without stenosis, negative bubble study.  Patient denies lower extremity edema, worsened shortness of breath, orthopnea or PND. She is on 3 L continuous supplemental O2. Breath sounds diminished bilaterally otherwise euvolemic and well compensated on exam.  -Continue Farxiga , lisinopril , metoprolol .   Hypertension BP today 115/62. No reports of headaches or dizziness.  -Continue lisinopril , metoprolol .   Hyperlipidemia LDL February 2024 73. -Continue rosuvastatin  and Zetia .   CVA/Left sided weakness 2018 and 2021.  Most recent CVA 03/17/2023 (results of MRI/MRA detailed in patient profile). Bubble study 03/22/2023 negative for interatrial shunting.  30-day ZIO without A-fib or other sustained arrhythmias.  Patient denies palpitations. She is working with outpatient speech therapy with improvement. She has not had PT  since home PT ended 3 weeks ago. She complains of generalized weakness that is worse on left side. Grips 1+ and equal.  - Refer to outpatient physical therapy.  - Continue aspirin , Plavix , rosuvastatin , Zetia . - Continue to follow with neurology.  Disposition: Return in 6 months or sooner as needed.          Signed, Lonell Rives. Samyak Sackmann, DNP, NP-C

## 2023-06-25 ENCOUNTER — Ambulatory Visit
Admission: RE | Admit: 2023-06-25 | Discharge: 2023-06-25 | Disposition: A | Discharge status: Home / Self Care | Source: Ambulatory Visit

## 2023-06-25 ENCOUNTER — Ambulatory Visit (INDEPENDENT_AMBULATORY_CARE_PROVIDER_SITE_OTHER)

## 2023-06-25 VITALS — BP 148/94 | HR 73 | Temp 97.9°F | Resp 18

## 2023-06-25 DIAGNOSIS — R051 Acute cough: Secondary | ICD-10-CM

## 2023-06-25 DIAGNOSIS — R059 Cough, unspecified: Secondary | ICD-10-CM | POA: Diagnosis not present

## 2023-06-25 DIAGNOSIS — R0989 Other specified symptoms and signs involving the circulatory and respiratory systems: Secondary | ICD-10-CM | POA: Diagnosis not present

## 2023-06-25 DIAGNOSIS — J441 Chronic obstructive pulmonary disease with (acute) exacerbation: Secondary | ICD-10-CM

## 2023-06-25 MED ORDER — BENZONATATE 100 MG PO CAPS: 100.0000 mg | ORAL_CAPSULE | Freq: Three times a day (TID) | ORAL | 0 refills | Status: DC

## 2023-06-25 MED ORDER — IPRATROPIUM-ALBUTEROL 0.5-2.5 (3) MG/3ML IN SOLN
3.0000 mL | Freq: Once | RESPIRATORY_TRACT | Status: AC
  Administered 2023-06-25: 3 mL via RESPIRATORY_TRACT

## 2023-06-25 MED ORDER — AMOXICILLIN-POT CLAVULANATE 500-125 MG PO TABS: 1.0000 | ORAL_TABLET | Freq: Two times a day (BID) | ORAL | 0 refills | Status: DC

## 2023-06-25 NOTE — ED Provider Notes (Signed)
 Arlander Bellman    CSN: [id number] Arrival date & time: 06/25/23  1040      History   Chief Complaint Chief Complaint  Patient presents with   Cough   Nasal Congestion   Shortness of Breath    HPI Hannah Kirby is a 78 y.o. female.   Patient presents today accompanied by her daughter who help provide the majority of history.  Reports 2 to 3-week history of URI symptoms including congestion, cough, sinus pressure, shortness of breath.  Denies any fever, chest pain, nausea, vomiting, diarrhea.  Denies any known sick contacts.  She has not had COVID recently.  She does have a history of COPD and is prescribed Trelegy Ellipta .  Reports using this medication as prescribed.  She also has albuterol  for rescue and has been using this more frequently without improvement of symptoms.  She denies any recent antibiotics or steroids.  She does have an intolerance to prednisone  and is unsure if she has ever taken methylprednisolone in the past.  She has been using over-the-counter medications including Mucinex and Coricidin without improvement.  She is having difficulty sleeping because of her symptoms.    Past Medical History:  Diagnosis Date   Acute hemorrhagic colitis 08/08/2019   Anemia    Anxiety    Arthritis    C. difficile colitis 08/09/2019   CAD (coronary artery disease)    a. 02/2006 PCI: BMS x 2 to RCA, cath o/w without significant coronary disease; b. nuclear stress test 07/2014: No ischemia/infarct; c. 11/2017 MV: no isch/infarct, EF 55-65%; d. 03/2020 NSTEMI/PCI: LM nl, LAD min irregs, RI 25, small, LCX nl, RCA 30p/m ISR, 99d (3.0x15 Resolute Onyx DES).   Cerebrovascular accident (CVA) (HCC) 03/18/2023   dysarthria   Chronic bronchitis (HCC)    secondary to cigarette smoking   Chronic kidney disease (CKD), stage III (moderate) (HCC)    COPD (chronic obstructive pulmonary disease) (HCC)    Diabetes mellitus    Diastolic dysfunction    a. 07/2019 Echo: EF 60-65%, no rwma,  mod LVH. Nl RV size/fxn; b. 03/2020 Echo: EF 60-65%, mod LVH, gr1 DD, nl RV size/fxn, mild AS.   FHx: allergies    GERD (gastroesophageal reflux disease)    Goiter    Granulomatous disease (HCC)    Hernia    Hyperlipidemia    Hypertension    Kidney stone on left side 2013   Microalbuminuria    NSTEMI (non-ST elevated myocardial infarction) (HCC) 03/23/2020   Obesity    Panic attacks    PVC's (premature ventricular contractions)    a. 03/2018 Zio: Occas PVCs (2.5%). Triggered events assoc w/ PVC/PAC.   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) 03/27/2023   Dysarthria 03/17/2023   Chronic obstructive pulmonary disease (HCC) 03/17/2023   Dizziness 02/23/2023   Dehydration 02/20/2023   Restrictive airway disease 07/21/2020   Partial thickness rotator cuff tear 03/20/2020   Shoulder pain 03/05/2020   Retinal detachment, left 03/04/2020   Ataxia 02/28/2020   Difficulty walking 02/28/2020   Physical deconditioning 02/28/2020   Chronic diastolic congestive heart failure (HCC) 11/20/2019   Polyp of ascending colon    Hypokalemia 08/08/2019   TIA (transient ischemic  attack) 08/01/2019   Benign hypertensive kidney disease with chronic kidney disease 04/25/2019   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   Chronic, continuous use of opioids 03/26/2018   History of adenomatous polyp of colon 01/22/2018   Ulceration of colon determined by endoscopy 01/22/2018   Diverticulosis of colon 01/22/2018   History of CVA (cerebrovascular accident) 11/08/2016   Weakness of left upper extremity 10/30/2016   AAA (abdominal aortic aneurysm) without rupture  (HCC) 08/12/2016   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   Stage 3b chronic kidney disease (HCC) 07/27/2015   Chest pain at rest 05/06/2015   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HCC) 10/28/2014   Solitary pulmonary nodule on lung CT 08/20/2014   Allergic rhinitis 07/28/2014   CAFL (chronic airflow limitation) (HCC) 07/28/2014   Acid reflux 07/28/2014   Granuloma annulare    Back pain 11/12/2013   Lumbar scoliosis 10/30/2013   Lumbar radiculopathy 10/30/2013   Lumbar canal stenosis 10/30/2013   Calcium  blood increased 10/22/2013   Proteinuria 10/22/2013   Obesity 03/21/2011   Smokes with greater than 30 pack year history 03/21/2011   Edema 08/18/2010   Hyperlipidemia 03/25/2009   Coronary artery disease 03/25/2009   Primary hypertension 03/25/2009    Past Surgical History:  Procedure Laterality Date   ABDOMINAL HYSTERECTOMY     BREAST SURGERY     CATARACT EXTRACTION W/PHACO Right 03/01/2017   Procedure: CATARACT EXTRACTION PHACO AND INTRAOCULAR LENS PLACEMENT (IOC) RIGHT DIABETIC;  Surgeon: Annell Kidney, MD;  Location: [facility];  Service: Ophthalmology;  Laterality: Right;   CATARACT EXTRACTION W/PHACO Left 03/22/2017   Procedure: CATARACT EXTRACTION PHACO AND INTRAOCULAR LENS PLACEMENT (IOC) LEFT DIABETIC;  Surgeon: Annell Kidney, MD;  Location: [facility];  Service: Ophthalmology;  Laterality: Left;  Diabetic - insulin  and oral meds   COLONOSCOPY WITH PROPOFOL  N/A 09/23/2014   Procedure: COLONOSCOPY WITH PROPOFOL ;  Surgeon: Deveron Fly, MD;  Location: [facility] ENDOSCOPY;  Service: Endoscopy;  Laterality: N/A;   COLONOSCOPY WITH PROPOFOL  N/A 01/11/2018   Procedure: COLONOSCOPY WITH PROPOFOL ;  Surgeon: Deveron Fly, MD;  Location: [facility] ENDOSCOPY;  Service: Endoscopy;  Laterality: N/A;   COLONOSCOPY WITH PROPOFOL  N/A 04/24/2018   Procedure: COLONOSCOPY WITH PROPOFOL ;  Surgeon: Deveron Fly, MD;  Location: [facility] ENDOSCOPY;  Service:  Endoscopy;  Laterality: N/A;   COLONOSCOPY WITH PROPOFOL  N/A 11/19/2019   Procedure: COLONOSCOPY WITH PROPOFOL ;  Surgeon: Marnee Sink, MD;  Location: [facility] ENDOSCOPY;  Service: Endoscopy;  Laterality: N/A;   CORONARY ANGIOPLASTY WITH STENT PLACEMENT  2008   CORONARY STENT INTERVENTION N/A 03/24/2020   Procedure: CORONARY STENT INTERVENTION;  Surgeon: Sammy Crisp, MD;  Location: [facility] INVASIVE CV LAB;  Service: Cardiovascular;  Laterality: N/A;   ESOPHAGOGASTRODUODENOSCOPY (EGD) WITH PROPOFOL  N/A 12/30/2014   Procedure: ESOPHAGOGASTRODUODENOSCOPY (EGD) WITH PROPOFOL ;  Surgeon: Deveron Fly, MD;  Location: [facility] ENDOSCOPY;  Service: Endoscopy;  Laterality: N/A;   ESOPHAGOGASTRODUODENOSCOPY (EGD) WITH PROPOFOL  N/A 07/19/2016   Procedure: ESOPHAGOGASTRODUODENOSCOPY (EGD) WITH PROPOFOL ;  Surgeon: Deveron Fly, MD;  Location: [facility] ENDOSCOPY;  Service: Endoscopy;  Laterality: N/A;   ESOPHAGOGASTRODUODENOSCOPY (EGD) WITH PROPOFOL  N/A 04/24/2018   Procedure: ESOPHAGOGASTRODUODENOSCOPY (EGD) WITH PROPOFOL ;  Surgeon: Deveron Fly, MD;  Location: [facility] ENDOSCOPY;  Service: Endoscopy;  Laterality: N/A;   hysterectomy (other)     LEFT HEART CATH AND CORONARY ANGIOGRAPHY N/A 03/24/2020   Procedure: LEFT HEART CATH AND CORONARY ANGIOGRAPHY;  Surgeon: Sammy Crisp, MD;  Location: [facility] INVASIVE CV LAB;  Service: Cardiovascular;  Laterality: N/A;    OB History   No obstetric history on file.      Home Medications    Prior to Admission medications   Medication Sig Start Date End Date Taking? Authorizing Provider  amoxicillin -clavulanate (AUGMENTIN ) 500-125 MG tablet Take 1 tablet by mouth in the morning and at bedtime. 06/25/23  Yes Nole Robey K, PA-C  benzonatate (TESSALON) 100 MG capsule Take 1 capsule (100 mg total) by mouth every 8 (eight) hours. 06/25/23  Yes Dishawn Bhargava K, PA-C  Finerenone (KERENDIA) 10 MG TABS Take 10 mg by mouth. 05/31/23 05/30/24 Yes [provider]  sertraline  (ZOLOFT) 25 MG tablet Take 1/2 tablet once daily for one week, then inrease to 1 tablet once daily and continue 06/01/23  Yes [provider]  acetaminophen  (TYLENOL ) 500 MG tablet Take 500 mg by mouth every 6 (six) hours as needed.    [provider]  albuterol  (VENTOLIN  HFA) 108 (90 Base) MCG/ACT inhaler Inhale 2 puffs into the lungs every 4 (four) hours as needed for wheezing or shortness of breath. 04/08/22   Lamon Pillow, MD  aspirin  (ASPIR-81) 81 MG EC tablet Take 81 mg by mouth daily.    [provider]  calcium  carbonate (TUMS - DOSED IN MG ELEMENTAL CALCIUM ) 500 MG chewable tablet Chew 1 tablet by mouth daily as needed.    [provider]  clopidogrel  (PLAVIX ) 75 MG tablet Take 1 tablet (75 mg total) by mouth daily. 08/16/22   Gollan, Timothy J, MD  cyanocobalamin  1000 MCG tablet Take 1,000 mcg by mouth daily.    [provider]  dapagliflozin  propanediol (FARXIGA ) 5 MG TABS tablet Take 1 tablet (5 mg total) by mouth daily before breakfast. Patient taking differently: Take 2.5 mg by mouth 2 (two) times daily. 02/20/23   Carlean Charter, DO  Docusate Sodium  (DSS) 100 MG CAPS Take 1 capsule by mouth daily.    [provider]  ezetimibe  (ZETIA ) 10 MG tablet Take 1 tablet (10 mg total) by mouth daily. 08/16/22   Gollan, Timothy J, MD  felodipine  (PLENDIL ) 5 MG 24 hr tablet TAKE 1 TABLET BY MOUTH DAILY 06/04/23   Lamon Pillow, MD  Fluticasone -Umeclidin-Vilant (TRELEGY ELLIPTA ) 100-62.5-25 MCG/ACT AEPB INHALE 1 PUFF BY MOUTH EVERY DAY - DISCARD DEVICE 6 WEEKS AFTER IT IS REMOVED FROM THE FOIL TRAY OR WHEN THE DOSE COUNTER READS &quot;0&quot; (WHICHEVER COMES FIRST) 06/10/23   Lamon Pillow, MD  HYDROcodone -acetaminophen  (NORCO) 7.5-325 MG tablet Take 1 tablet by mouth every 6 (six) hours as needed for moderate pain (pain score 4-6). 03/04/23   Lamon Pillow, MD  insulin  glargine (LANTUS ) 100 UNIT/ML injection Inject 22 Units into the skin  daily.    [provider]  lisinopril  (ZESTRIL ) 20 MG tablet Take 1 tablet (20 mg total) by mouth daily. 08/16/22   Gollan, Timothy J, MD  meclizine  (ANTIVERT ) 25 MG tablet Take 25 mg by mouth 3 (three) times daily as needed. 02/04/22   [provider]  metoprolol  tartrate (LOPRESSOR ) 100 MG tablet Take 1 tablet (100 mg total) by mouth 2 (two) times daily. 08/16/22   Gollan, Timothy J, MD  mirtazapine  (REMERON ) 7.5 MG tablet Take 1 tablet (7.5 mg total) by mouth at bedtime. 06/06/23   Lamon Pillow, MD  Multiple Vitamins-Minerals (DAILY MULTI) TABS Take 1 tablet by mouth daily.    [provider]  nitroGLYCERIN  (NITROSTAT ) 0.4 MG SL  tablet Place 1 tablet (0.4 mg total) under the tongue every 5 (five) minutes as needed. 08/16/22   Gollan, Timothy J, MD  Omega-3 Fatty Acids (FISH OIL ) 1000 MG CAPS Take 2 capsules by mouth in the morning and at bedtime.     [provider]  ondansetron  (ZOFRAN  ODT) 4 MG disintegrating tablet Take 1 tablet (4 mg total) by mouth every 8 (eight) hours as needed for nausea or vomiting. 08/06/16   Gearline Kell, [place], MD  RABEprazole  (ACIPHEX ) 20 MG tablet Take 1 tablet (20 mg total) by mouth daily. 15 Mins. before evening meal 11/08/22   Marnee Sink, MD  rosuvastatin  (CRESTOR ) 40 MG tablet Take 1 tablet (40 mg total) by mouth daily. 03/30/23   Lamon Pillow, MD  Semaglutide,0.25 or 0.5MG /DOS, 2 MG/1.5ML SOPN Inject 0.5 mg into the skin once a week. 01/13/20   [provider]  sucralfate  (CARAFATE ) 1 g tablet Take 1 tablet (1 g total) by mouth 2 (two) times daily. 15 Minutes before evening meal and at bed time 11/08/22   Marnee Sink, MD    Family History Family History  Problem Relation Age of Onset   Heart failure Mother    Stroke Mother    Diabetes Mother    Congestive Heart Failure Mother    Lung cancer Father    Breast cancer Maternal Aunt     Social History Social History   Tobacco Use   Smoking status: Every Day     Current packs/day: 2.00    Average packs/day: 2.0 packs/day for 56.3 years (112.7 ttl pk-yrs)    Types: Cigarettes    Start date: 1969   Smokeless tobacco: Never  Vaping Use   Vaping status: Former  Substance Use Topics   Alcohol use: Yes    Comment: 1 glass of wine every few months on special occassions   Drug use: No     Allergies   Spironolactone , Sulfa antibiotics, Sulfonamide derivatives, Other, Codeine, and Prednisone    Review of Systems Review of Systems  Constitutional:  Positive for activity change. Negative for appetite change, fatigue and fever.  HENT:  Positive for congestion, postnasal drip, sinus pressure and sore throat. Negative for sneezing.   Respiratory:  Positive for cough, shortness of breath and wheezing. Negative for chest tightness.   Cardiovascular:  Negative for chest pain.  Gastrointestinal:  Negative for abdominal pain, diarrhea, nausea and vomiting.  Neurological:  Positive for headaches. Negative for dizziness and light-headedness.     Physical Exam Triage Vital Signs ED Triage Vitals  Encounter Vitals Group     BP 06/25/23 1102 (!) 148/94     Systolic BP Percentile --      Diastolic BP Percentile --      Pulse Rate 06/25/23 1102 73     Resp 06/25/23 1102 18     Temp 06/25/23 1102 97.9 F (36.6 C)     Temp src --      SpO2 06/25/23 1102 93 %     Weight --      Height --      Head Circumference --      Peak Flow --      Pain Score 06/25/23 1058 4     Pain Loc --      Pain Education --      Exclude from Growth Chart --    No data found.  Updated Vital Signs BP (!) 148/94   Pulse 73   Temp 97.9 F (36.6 C)  Resp 18   SpO2 93%   Visual Acuity Right Eye Distance:   Left Eye Distance:   Bilateral Distance:    Right Eye Near:   Left Eye Near:    Bilateral Near:     Physical Exam Vitals reviewed.  Constitutional:      General: She is awake. She is not in acute distress.    Appearance: Normal appearance. She is  well-developed. She is not ill-appearing.     Comments: Very pleasant female appears stated age in no acute distress sitting comfortably in exam room  HENT:     Head: Normocephalic and atraumatic.     Right Ear: Tympanic membrane, ear canal and external ear normal. Tympanic membrane is not erythematous or bulging.     Left Ear: Tympanic membrane, ear canal and external ear normal. Tympanic membrane is not erythematous or bulging.     Nose: Nose normal.     Right Sinus: No maxillary sinus tenderness or frontal sinus tenderness.     Left Sinus: No maxillary sinus tenderness or frontal sinus tenderness.     Mouth/Throat:     Pharynx: Uvula midline. Postnasal drip present. No oropharyngeal exudate or posterior oropharyngeal erythema.  Cardiovascular:     Rate and Rhythm: Normal rate and regular rhythm.     Heart sounds: Normal heart sounds, S1 normal and S2 normal. No murmur heard. Pulmonary:     Effort: Pulmonary effort is normal.     Breath sounds: Examination of the left-lower field reveals rales. Wheezing and rales present. No rhonchi.  Psychiatric:        Behavior: Behavior is cooperative.      UC Treatments / Results  Labs (all labs ordered are listed, but only abnormal results are displayed) Labs Reviewed - No data to display  EKG   Radiology DG Chest 2 View Result Date: 06/25/2023 CLINICAL DATA:  worsening cough, rales on left EXAM: CHEST - 2 VIEW COMPARISON:  03/17/2023 FINDINGS: Lungs are clear. Heart size and mediastinal contours are within normal limits. Aortic Atherosclerosis (ICD10-170.0). No effusion. Visualized bones unremarkable. IMPRESSION: No acute cardiopulmonary disease. Electronically Signed   By: Nicoletta Barrier M.D.   On: 06/25/2023 11:41    Procedures Procedures (including critical care time)  Medications Ordered in UC Medications  ipratropium-albuterol  (DUONEB) 0.5-2.5 (3) MG/3ML nebulizer solution 3 mL (3 mLs Nebulization Given 06/25/23 1139)    Initial  Impression / Assessment and Plan / UC Course  I have reviewed the triage vital signs and the nursing notes.  Pertinent labs & imaging results that were available during my care of the patient were reviewed by me and considered in my medical decision making (see chart for details).     Patient is well-appearing, afebrile, nontoxic, nontachycardic.  No indication for viral testing as she has been symptomatic for several weeks and this would not change our management.  She was given a DuoNeb in clinic with improvement of symptoms.  Chest x-ray was obtained that showed no acute cardiopulmonary disease.  Concern for COPD exacerbation given her clinical presentation.  Will start Augmentin  twice daily for 10 days.  No indication for dose adjustment based on metabolic panel from 05/10/2023 with creatinine of 1.47 and calculated creatinine clearance of 36.84 mL/min.  Will defer systemic steroids as she has had difficulty tolerating these in the past.  She was encouraged to continue with her maintenance and rescue medications as previously prescribed.  She was given Tessalon for cough.  She can use over-the-counter medication  including Mucinex, Flonase, Tylenol  for additional symptom relief.  Recommend close follow-up with her primary care.  We discussed that if anything worsens or changes she needs to be seen emergently including worsening cough, shortness of breath, chest pain, nausea/vomiting interfering with oral intake.  Strict return precautions given.  All questions answered to patient and daughter satisfaction.  Final Clinical Impressions(s) / UC Diagnoses   Final diagnoses:  Acute cough  COPD exacerbation [facility])     Discharge Instructions      Your x-ray was normal with no evidence of pneumonia which is great news.  We are going to treat you for a COPD exacerbation.  Start Augmentin  twice daily for 10 days.  Use Tessalon to help with your cough.  Continue your inhalers as previously prescribed.   Make sure you rest and drink plenty of fluid.  Use over-the-counter medication including Mucinex, Flonase, Tylenol  for additional symptom relief.  Follow-up with your primary care next week.  If anything worsens and you have fever, worsening cough, shortness of breath, chest pain you need to be seen immediately.     ED Prescriptions     Medication Sig Dispense Auth. Provider   amoxicillin -clavulanate (AUGMENTIN ) 500-125 MG tablet Take 1 tablet by mouth in the morning and at bedtime. 20 tablet Faviola Klare K, PA-C   benzonatate (TESSALON) 100 MG capsule Take 1 capsule (100 mg total) by mouth every 8 (eight) hours. 21 capsule Solae Norling K, PA-C      PDMP not reviewed this encounter.   Budd Cargo, PA-C 06/25/23 1208

## 2023-06-25 NOTE — ED Triage Notes (Signed)
 Patient to Urgent Care with complaints of productive cough/ nasal and chest congestion/ sinus pressure/ shortness of breath with exertion. Denies any known fevers.   Symptoms started two-three weeks ago.  Meds: Mucinex/ Coricidin.

## 2023-06-25 NOTE — Discharge Instructions (Signed)
 Your x-ray was normal with no evidence of pneumonia which is great news.  We are going to treat you for a COPD exacerbation.  Start Augmentin  twice daily for 10 days.  Use Tessalon to help with your cough.  Continue your inhalers as previously prescribed.  Make sure you rest and drink plenty of fluid.  Use over-the-counter medication including Mucinex, Flonase, Tylenol  for additional symptom relief.  Follow-up with your primary care next week.  If anything worsens and you have fever, worsening cough, shortness of breath, chest pain you need to be seen immediately.

## 2023-06-26 ENCOUNTER — Ambulatory Visit: Payer: Medicare Other | Attending: Student | Admitting: Student

## 2023-06-26 ENCOUNTER — Encounter: Payer: Self-pay | Admitting: Student

## 2023-06-26 VITALS — BP 115/64 | HR 70 | Ht 64.0 in | Wt 160.2 lb

## 2023-06-26 DIAGNOSIS — R531 Weakness: Secondary | ICD-10-CM | POA: Insufficient documentation

## 2023-06-26 DIAGNOSIS — I6930 Unspecified sequelae of cerebral infarction: Secondary | ICD-10-CM | POA: Diagnosis not present

## 2023-06-26 DIAGNOSIS — I2510 Atherosclerotic heart disease of native coronary artery without angina pectoris: Secondary | ICD-10-CM | POA: Insufficient documentation

## 2023-06-26 DIAGNOSIS — I5032 Chronic diastolic (congestive) heart failure: Secondary | ICD-10-CM | POA: Diagnosis not present

## 2023-06-26 NOTE — Patient Instructions (Signed)
 Medication Instructions:  Your Physician recommend you continue on your current medication as directed.    *If you need a refill on your cardiac medications before your next appointment, please call your pharmacy*  Follow-Up: At [facility], you and your health needs are our priority.  As part of our continuing mission to provide you with exceptional heart care, our providers are all part of one team.  This team includes your primary Cardiologist (physician) and Advanced Practice Providers or APPs (Physician Assistants and Nurse Practitioners) who all work together to provide you with the care you need, when you need it.  Your next appointment:   6 month(s)  Provider:   You may see Timothy Gollan, MD or one of the following Advanced Practice Providers on your designated Care Team:   Laneta Pintos, NP Gildardo Labrador, PA-C Varney Gentleman, PA-C Cadence Morningside, PA-C Ronald Cockayne, NP Morey Ar, NP    We recommend signing up for the patient portal called "MyChart".  Sign up information is provided on this After Visit Summary.  MyChart is used to connect with patients for Virtual Visits (Telemedicine).  Patients are able to view lab/test results, encounter notes, upcoming appointments, etc.  Non-urgent messages can be sent to your provider as well.   To learn more about what you can do with MyChart, go to [email or web address].   Other Instructions  You have been referred to Outpatient Physical Therapy for Left-sided weakness.

## 2023-06-27 ENCOUNTER — Ambulatory Visit: Attending: Family Medicine

## 2023-06-27 DIAGNOSIS — R2681 Unsteadiness on feet: Secondary | ICD-10-CM | POA: Diagnosis not present

## 2023-06-27 DIAGNOSIS — I129 Hypertensive chronic kidney disease with stage 1 through stage 4 chronic kidney disease, or unspecified chronic kidney disease: Secondary | ICD-10-CM | POA: Diagnosis not present

## 2023-06-27 DIAGNOSIS — R4701 Aphasia: Secondary | ICD-10-CM

## 2023-06-27 DIAGNOSIS — R809 Proteinuria, unspecified: Secondary | ICD-10-CM | POA: Diagnosis not present

## 2023-06-27 DIAGNOSIS — I69890 Apraxia following other cerebrovascular disease: Secondary | ICD-10-CM | POA: Diagnosis not present

## 2023-06-27 DIAGNOSIS — R269 Unspecified abnormalities of gait and mobility: Secondary | ICD-10-CM | POA: Insufficient documentation

## 2023-06-27 DIAGNOSIS — N1832 Chronic kidney disease, stage 3b: Secondary | ICD-10-CM | POA: Diagnosis not present

## 2023-06-27 DIAGNOSIS — R262 Difficulty in walking, not elsewhere classified: Secondary | ICD-10-CM | POA: Diagnosis not present

## 2023-06-27 DIAGNOSIS — R2689 Other abnormalities of gait and mobility: Secondary | ICD-10-CM | POA: Insufficient documentation

## 2023-06-27 DIAGNOSIS — E1122 Type 2 diabetes mellitus with diabetic chronic kidney disease: Secondary | ICD-10-CM | POA: Diagnosis not present

## 2023-06-27 DIAGNOSIS — N2581 Secondary hyperparathyroidism of renal origin: Secondary | ICD-10-CM | POA: Diagnosis not present

## 2023-06-27 DIAGNOSIS — M6281 Muscle weakness (generalized): Secondary | ICD-10-CM | POA: Diagnosis not present

## 2023-06-27 NOTE — Therapy (Signed)
 OUTPATIENT SPEECH LANGUAGE PATHOLOGY APHASIA TREATMENT   Patient Name: Hannah Kirby MRN: [id number] DOB:[birth year], 78 y.o., female Today's Date: 06/27/2023  PCP: Jeralene Mom, MD REFERRING PROVIDER: Jeralene Mom, MD   End of Session - 06/27/23 1208     Visit Number 5    Number of Visits 24    Date for SLP Re-Evaluation 08/30/23    SLP Start Time 1100    SLP Stop Time  1150    SLP Time Calculation (min) 50 min             Past Medical History:  Diagnosis Date   Acute hemorrhagic colitis 08/08/2019   Anemia    Anxiety    Arthritis    C. difficile colitis 08/09/2019   CAD (coronary artery disease)    a. 02/2006 PCI: BMS x 2 to RCA, cath o/w without significant coronary disease; b. nuclear stress test 07/2014: No ischemia/infarct; c. 11/2017 MV: no isch/infarct, EF 55-65%; d. 03/2020 NSTEMI/PCI: LM nl, LAD min irregs, RI 25, small, LCX nl, RCA 30p/m ISR, 99d (3.0x15 Resolute Onyx DES).   Cerebrovascular accident (CVA) (HCC) 03/18/2023   dysarthria   Chronic bronchitis (HCC)    secondary to cigarette smoking   Chronic kidney disease (CKD), stage III (moderate) (HCC)    COPD (chronic obstructive pulmonary disease) (HCC)    Diabetes mellitus    Diastolic dysfunction    a. 07/2019 Echo: EF 60-65%, no rwma, mod LVH. Nl RV size/fxn; b. 03/2020 Echo: EF 60-65%, mod LVH, gr1 DD, nl RV size/fxn, mild AS.   FHx: allergies    GERD (gastroesophageal reflux disease)    Goiter    Granulomatous disease (HCC)    Hernia    Hyperlipidemia    Hypertension    Kidney stone on left side 2013   Microalbuminuria    NSTEMI (non-ST elevated myocardial infarction) (HCC) 03/23/2020   Obesity    Panic attacks    PVC's (premature ventricular contractions)    a. 03/2018 Zio: Occas PVCs (2.5%). Triggered events assoc w/ PVC/PAC.   Retinal tear 2020   Smokers' cough (HCC)    Spinal stenosis    Stroke (HCC) 10/29/2016   mild left side weakness   Stroke (HCC) 08/01/2019   Tobacco abuse     Past Surgical History:  Procedure Laterality Date   ABDOMINAL HYSTERECTOMY     BREAST SURGERY     CATARACT EXTRACTION W/PHACO Right 03/01/2017   Procedure: CATARACT EXTRACTION PHACO AND INTRAOCULAR LENS PLACEMENT (IOC) RIGHT DIABETIC;  Surgeon: Annell Kidney, MD;  Location: [facility];  Service: Ophthalmology;  Laterality: Right;   CATARACT EXTRACTION W/PHACO Left 03/22/2017   Procedure: CATARACT EXTRACTION PHACO AND INTRAOCULAR LENS PLACEMENT (IOC) LEFT DIABETIC;  Surgeon: Annell Kidney, MD;  Location: [facility];  Service: Ophthalmology;  Laterality: Left;  Diabetic - insulin  and oral meds   COLONOSCOPY WITH PROPOFOL  N/A 09/23/2014   Procedure: COLONOSCOPY WITH PROPOFOL ;  Surgeon: Deveron Fly, MD;  Location: [facility] ENDOSCOPY;  Service: Endoscopy;  Laterality: N/A;   COLONOSCOPY WITH PROPOFOL  N/A 01/11/2018   Procedure: COLONOSCOPY WITH PROPOFOL ;  Surgeon: Deveron Fly, MD;  Location: [facility] ENDOSCOPY;  Service: Endoscopy;  Laterality: N/A;   COLONOSCOPY WITH PROPOFOL  N/A 04/24/2018   Procedure: COLONOSCOPY WITH PROPOFOL ;  Surgeon: Deveron Fly, MD;  Location: [facility] ENDOSCOPY;  Service: Endoscopy;  Laterality: N/A;   COLONOSCOPY WITH PROPOFOL  N/A 11/19/2019   Procedure: COLONOSCOPY WITH PROPOFOL ;  Surgeon: Marnee Sink, MD;  Location: [facility] ENDOSCOPY;  Service: Endoscopy;  Laterality: N/A;   CORONARY ANGIOPLASTY WITH STENT PLACEMENT  2008   CORONARY STENT INTERVENTION N/A 03/24/2020   Procedure: CORONARY STENT INTERVENTION;  Surgeon: Sammy Crisp, MD;  Location: [facility] INVASIVE CV LAB;  Service: Cardiovascular;  Laterality: N/A;   ESOPHAGOGASTRODUODENOSCOPY (EGD) WITH PROPOFOL  N/A 12/30/2014   Procedure: ESOPHAGOGASTRODUODENOSCOPY (EGD) WITH PROPOFOL ;  Surgeon: Deveron Fly, MD;  Location: [facility] ENDOSCOPY;  Service: Endoscopy;  Laterality: N/A;   ESOPHAGOGASTRODUODENOSCOPY (EGD) WITH PROPOFOL  N/A 07/19/2016   Procedure: ESOPHAGOGASTRODUODENOSCOPY (EGD)  WITH PROPOFOL ;  Surgeon: Deveron Fly, MD;  Location: [facility] ENDOSCOPY;  Service: Endoscopy;  Laterality: N/A;   ESOPHAGOGASTRODUODENOSCOPY (EGD) WITH PROPOFOL  N/A 04/24/2018   Procedure: ESOPHAGOGASTRODUODENOSCOPY (EGD) WITH PROPOFOL ;  Surgeon: Deveron Fly, MD;  Location: [facility] ENDOSCOPY;  Service: Endoscopy;  Laterality: N/A;   hysterectomy (other)     LEFT HEART CATH AND CORONARY ANGIOGRAPHY N/A 03/24/2020   Procedure: LEFT HEART CATH AND CORONARY ANGIOGRAPHY;  Surgeon: Sammy Crisp, MD;  Location: [facility] INVASIVE CV LAB;  Service: Cardiovascular;  Laterality: N/A;   Patient Active Problem List   Diagnosis Date Noted   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) 03/27/2023   Dysarthria 03/17/2023   Chronic obstructive pulmonary disease (HCC) 03/17/2023   Dizziness 02/23/2023   Dehydration 02/20/2023   Restrictive airway disease 07/21/2020   Partial thickness rotator cuff tear 03/20/2020   Shoulder pain 03/05/2020   Retinal detachment, left 03/04/2020   Ataxia 02/28/2020   Difficulty walking 02/28/2020   Physical deconditioning 02/28/2020   Chronic diastolic congestive heart failure (HCC) 11/20/2019   Polyp of ascending colon    Hypokalemia 08/08/2019   TIA (transient ischemic attack) 08/01/2019   Benign hypertensive kidney disease with chronic kidney disease 04/25/2019   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   Chronic, continuous use of opioids 03/26/2018   History of adenomatous polyp of colon 01/22/2018   Ulceration of colon determined by endoscopy 01/22/2018   Diverticulosis of colon 01/22/2018   History of CVA (cerebrovascular accident) 11/08/2016   Weakness of left upper extremity 10/30/2016   AAA (abdominal aortic aneurysm) without rupture (HCC) 08/12/2016   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   Stage 3b chronic kidney disease (HCC)  07/27/2015   Chest pain at rest 05/06/2015   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HCC) 10/28/2014   Solitary pulmonary nodule on lung CT 08/20/2014   Allergic rhinitis 07/28/2014   CAFL (chronic airflow limitation) (HCC) 07/28/2014   Acid reflux 07/28/2014   Granuloma annulare    Back pain 11/12/2013   Lumbar scoliosis 10/30/2013   Lumbar radiculopathy 10/30/2013   Lumbar canal stenosis 10/30/2013   Calcium  blood increased 10/22/2013   Proteinuria 10/22/2013   Obesity 03/21/2011   Smokes with greater than 30 pack year history 03/21/2011   Edema 08/18/2010   Hyperlipidemia 03/25/2009   Coronary artery disease 03/25/2009   Primary hypertension 03/25/2009    ONSET DATE: 03/17/23 (admitted with stroke), 05/23/23 (referral date)  REFERRING DIAG:  R41.841 (ICD-10-CM) - Cognitive communication deficit  I63.9 (ICD-10-CM) - CVA (cerebral vascular accident) (HCC)  R47.02 (ICD-10-CM) - Dysphasia    THERAPY DIAG:  Aphasia  Apraxia following other cerebrovascular disease  Rationale for Evaluation and Treatment Rehabilitation  SUBJECTIVE:   SUBJECTIVE STATEMENT: Pt alert, pleasant, and cooperative. On home O2.  Pt accompanied by: self, family member  PERTINENT HISTORY:  Pt is a 78 y.o. female who presents for communication evaluation in setting on  stroke. Pt in ED 1/24-1/25/25. Pt d/c'd home with HH ST. PMHx significant of   HTN, CKD stage IIIB, DMT2 on insulin , CAD, chronic smoking, chronic respiratory failure with COPD and on home oxygen , anxiety, HLD. MRI 03/17/23 "1. Punctate foci of restricted diffusion in the left posterior  frontal white matter, most likely acute infarcts.  2. Numerous foci of hemosiderin deposition, which are seen in the  deep gray structures but also in the bilateral cerebral hemispheres,  with 1 new focus compared to 02/20/2023. 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 No  FALLS: defer to PT; under care of HH PT  LIVING ENVIRONMENT: Lives with: lives with their spouse Lives in: House/apartment  PLOF:  Level of assistance: Independent with ADLs Employment: Retired   PATIENT GOALS    for communication to improve   OBJECTIVE:  TODAY'S TREATMENT:   SGD Evaluation: Completed SGD evaluation utilizing Quick Assess App via [employer] as well as informal assessment.  Physical Assessment  Touch Accuracy  Largest - Hit  Large - Hit  Normal - Hit  Small - Hit  Smallest - Hit Visual Field Test  Lower R Corner - Hit  Upper L Corner - Hit    Lower L Corner - Hit  Upper R Corner - Hit  Middle - Hit  Language Assessment  Phrase Completion - High  Categorization - High  Repetition - High  Confrontation Naming - Medium  Conversational Speech - Low  Device Simulation  Simple Simulation - Completed  Complex Simulation - Completed  Pt and daughter educated re: rationale for SGD, benefits of SGD, and research supporting SGD use in persons wit aphasia. Pt and daughter agreeable to pursuing SGD trial.   Speech/Language Tx:  Pt repeated 4-5 syllable words word phrases with 60% accuracy indep, improving to 100% with mod verbal hierarchical cueing and self-correction.   Introduced Probation officer. Pt co-constructed simple script for ordering at a preferred restaurant. Pt rehearsed script with min cueing.   Reviewed supported communication. Pt able to answer personally relevant questions. Will continue to utilize in upcoming sessions.   PATIENT EDUCATION: Education details: as above Person educated: Patient and Child(ren) Education method: Explanation Education comprehension: verbalized understanding and needs further education  HOME EXERCISE PROGRAM:   TalkPath Therapy - on computer - if able to access  Worksheets flagged in workbooks from home  Practice SFA, supported  communication   GOALS:  Goals reviewed with patient? Yes  SHORT TERM GOALS: Target date: 10 sessions  Pt will participate in further assessment of functional reading/writing. Baseline: Goal status: INITIAL  2.  With Moderate A, patient will complete a semantic feature analysis with at least 2 relevant features for 8/10 target words to improve word-finding skills.  Baseline:  Goal status: INITIAL  3.  With Maximal A, patient will generate sentences with 3 or more words in response to a situation at 80% accuracy in order to increase ability to communicate basic wants and needs.  Baseline:  Goal status: INITIAL  4.   With Maximal A,  pt will follow 2-step commands for improved participation in ADLs/iADLs with max cueing. Baseline:  Goal status: INITIAL  5.  Pt will repeat short sentences (<4 words) with good approximations 80% of the time with max cueing. Baseline:  Goal status: INITIAL    LONG TERM GOALS: Target date: 12 weeks  Pt will report a subjective improvement in communication per PROM.  Baseline: CES 15/32 on 06/07/23 Goal status: INITIAL  2.  With Min A, patient/family will demonstrate understanding of the following concepts: aphasia, spontaneous recovery, communication vs conversation, strengths/strategies to promote success, local resources in order to increase patient's participation in medical care.    Baseline:  Goal status: INITIAL   ASSESSMENT:  CLINICAL IMPRESSION: Pt is a 78 y.o. female who presents for communication evaluation in setting on stroke. Pt in ED 1/24-1/25/25 for stroke. Pt d/c'd home with HH ST. PMHx significant of  HTN, CKD stage IIIB, DMT2 on insulin , CAD, chronic smoking, chronic respiratory failure with COPD and on home oxygen , anxiety, HLD. Assessment completed via functional/dynamic means including selected subtests of Western Aphasia Battery Revised (WAB-R) and PROM (Communication Effectiveness Scale). 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 Pt with halting, telegraphic speech, mostly single words, paraphasias, neologisms, severe wordfinding difficulty. Similar difficulty noted with confrontation naming and divergent naming. Auditory comprehension is a relatively strength for pt as pt with intact single word recognition and ability to answer simple and complex yes/no questions. Increased difficulty noted with pt's ability to follow 2-step (moderately complex and complex) commands. Pt with impaired repetition at the word, phase, and sentence level. 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 SGD evaluation completed 06/27/23 with SGD trial recommending. See details of assessment and tx session above. Recommend course of ST targeting functional communication, further assessment of functional reading/writing, and pt/caregiver training to help promote QoL and overall life participation.   OBJECTIVE IMPAIRMENTS include expressive language, receptive language, and aphasia. These impairments are limiting patient from ADLs/IADLs and effectively communicating at home and in community. Factors affecting potential to achieve goals and functional outcome are severity of impairments. Patient will benefit from skilled SLP services to address above impairments and improve overall function.  REHAB POTENTIAL: Good  PLAN: SLP FREQUENCY: 2x/week  SLP DURATION: 12 weeks  PLANNED INTERVENTIONS: Language facilitation, Cueing hierachy, Internal/external aids, Functional tasks, Multimodal communication approach, SLP instruction and feedback, Compensatory strategies, and Patient/family education    Dia Forget, M.S., CCC-SLP Speech-Language Pathologist [place] [facility] [phone] [place])  [facility] at [facility] [address] [place], [place], [address] Phone: [phone]   Fax:  [phone]

## 2023-06-28 DIAGNOSIS — N2581 Secondary hyperparathyroidism of renal origin: Secondary | ICD-10-CM | POA: Diagnosis not present

## 2023-06-28 DIAGNOSIS — I129 Hypertensive chronic kidney disease with stage 1 through stage 4 chronic kidney disease, or unspecified chronic kidney disease: Secondary | ICD-10-CM | POA: Diagnosis not present

## 2023-06-28 DIAGNOSIS — N1832 Chronic kidney disease, stage 3b: Secondary | ICD-10-CM | POA: Diagnosis not present

## 2023-06-28 DIAGNOSIS — E1122 Type 2 diabetes mellitus with diabetic chronic kidney disease: Secondary | ICD-10-CM | POA: Diagnosis not present

## 2023-06-28 DIAGNOSIS — R809 Proteinuria, unspecified: Secondary | ICD-10-CM | POA: Diagnosis not present

## 2023-06-29 ENCOUNTER — Other Ambulatory Visit: Payer: Self-pay | Admitting: Cardiovascular Disease

## 2023-06-29 ENCOUNTER — Ambulatory Visit

## 2023-06-29 DIAGNOSIS — I69890 Apraxia following other cerebrovascular disease: Secondary | ICD-10-CM | POA: Diagnosis not present

## 2023-06-29 DIAGNOSIS — R4701 Aphasia: Secondary | ICD-10-CM | POA: Diagnosis not present

## 2023-06-29 DIAGNOSIS — R2689 Other abnormalities of gait and mobility: Secondary | ICD-10-CM | POA: Diagnosis not present

## 2023-06-29 DIAGNOSIS — M6281 Muscle weakness (generalized): Secondary | ICD-10-CM | POA: Diagnosis not present

## 2023-06-29 DIAGNOSIS — R262 Difficulty in walking, not elsewhere classified: Secondary | ICD-10-CM | POA: Diagnosis not present

## 2023-06-29 DIAGNOSIS — R269 Unspecified abnormalities of gait and mobility: Secondary | ICD-10-CM | POA: Diagnosis not present

## 2023-06-29 NOTE — Therapy (Signed)
 OUTPATIENT SPEECH LANGUAGE PATHOLOGY APHASIA TREATMENT   Patient Name: Hannah Kirby MRN: [id number] DOB:October 07, 1945, 78 y.o., female Today's Date: 06/29/2023  PCP: Jeralene Mom, MD REFERRING PROVIDER: Jeralene Mom, MD   End of Session - 06/29/23 1624     Visit Number 6    Number of Visits 24    Date for SLP Re-Evaluation 08/30/23    SLP Start Time 1530    SLP Stop Time  1620    SLP Time Calculation (min) 50 min    Activity Tolerance Patient tolerated treatment well             Past Medical History:  Diagnosis Date   Acute hemorrhagic colitis 08/08/2019   Anemia    Anxiety    Arthritis    C. difficile colitis 08/09/2019   CAD (coronary artery disease)    a. 02/2006 PCI: BMS x 2 to RCA, cath o/w without significant coronary disease; b. nuclear stress test 07/2014: No ischemia/infarct; c. 11/2017 MV: no isch/infarct, EF 55-65%; d. 03/2020 NSTEMI/PCI: LM nl, LAD min irregs, RI 25, small, LCX nl, RCA 30p/m ISR, 99d (3.0x15 Resolute Onyx DES).   Cerebrovascular accident (CVA) (HCC) 03/18/2023   dysarthria   Chronic bronchitis (HCC)    secondary to cigarette smoking   Chronic kidney disease (CKD), stage III (moderate) (HCC)    COPD (chronic obstructive pulmonary disease) (HCC)    Diabetes mellitus    Diastolic dysfunction    a. 07/2019 Echo: EF 60-65%, no rwma, mod LVH. Nl RV size/fxn; b. 03/2020 Echo: EF 60-65%, mod LVH, gr1 DD, nl RV size/fxn, mild AS.   FHx: allergies    GERD (gastroesophageal reflux disease)    Goiter    Granulomatous disease (HCC)    Hernia    Hyperlipidemia    Hypertension    Kidney stone on left side 2013   Microalbuminuria    NSTEMI (non-ST elevated myocardial infarction) (HCC) 03/23/2020   Obesity    Panic attacks    PVC's (premature ventricular contractions)    a. 03/2018 Zio: Occas PVCs (2.5%). Triggered events assoc w/ PVC/PAC.   Retinal tear 2020   Smokers' cough (HCC)    Spinal stenosis    Stroke (HCC) 10/29/2016   mild left side  weakness   Stroke (HCC) 08/01/2019   Tobacco abuse    Past Surgical History:  Procedure Laterality Date   ABDOMINAL HYSTERECTOMY     BREAST SURGERY     CATARACT EXTRACTION W/PHACO Right 03/01/2017   Procedure: CATARACT EXTRACTION PHACO AND INTRAOCULAR LENS PLACEMENT (IOC) RIGHT DIABETIC;  Surgeon: Annell Kidney, MD;  Location: [facility];  Service: Ophthalmology;  Laterality: Right;   CATARACT EXTRACTION W/PHACO Left 03/22/2017   Procedure: CATARACT EXTRACTION PHACO AND INTRAOCULAR LENS PLACEMENT (IOC) LEFT DIABETIC;  Surgeon: Annell Kidney, MD;  Location: [facility];  Service: Ophthalmology;  Laterality: Left;  Diabetic - insulin  and oral meds   COLONOSCOPY WITH PROPOFOL  N/A 09/23/2014   Procedure: COLONOSCOPY WITH PROPOFOL ;  Surgeon: Deveron Fly, MD;  Location: [facility] ENDOSCOPY;  Service: Endoscopy;  Laterality: N/A;   COLONOSCOPY WITH PROPOFOL  N/A 01/11/2018   Procedure: COLONOSCOPY WITH PROPOFOL ;  Surgeon: Deveron Fly, MD;  Location: [facility] ENDOSCOPY;  Service: Endoscopy;  Laterality: N/A;   COLONOSCOPY WITH PROPOFOL  N/A 04/24/2018   Procedure: COLONOSCOPY WITH PROPOFOL ;  Surgeon: Deveron Fly, MD;  Location: [facility] ENDOSCOPY;  Service: Endoscopy;  Laterality: N/A;   COLONOSCOPY WITH PROPOFOL  N/A 11/19/2019   Procedure: COLONOSCOPY WITH PROPOFOL ;  Surgeon: Ole Berkeley,  Darren, MD;  Location: [facility] ENDOSCOPY;  Service: Endoscopy;  Laterality: N/A;   CORONARY ANGIOPLASTY WITH STENT PLACEMENT  2008   CORONARY STENT INTERVENTION N/A 03/24/2020   Procedure: CORONARY STENT INTERVENTION;  Surgeon: Sammy Crisp, MD;  Location: [facility] INVASIVE CV LAB;  Service: Cardiovascular;  Laterality: N/A;   ESOPHAGOGASTRODUODENOSCOPY (EGD) WITH PROPOFOL  N/A 12/30/2014   Procedure: ESOPHAGOGASTRODUODENOSCOPY (EGD) WITH PROPOFOL ;  Surgeon: Deveron Fly, MD;  Location[facility] END[facility];  Service: Endoscopy;  Laterality: N/A;   ESOPHAGOGASTRODUODENOSCOPY (EGD) WITH PROPOFOL  N/A  07/19/2016   Procedure: ESOPHAGOGASTRODUODENOSCOPY (EGD) WITH PROPOFOL ;  Surgeon: Deveron Fly, MD;  Location: [facility] ENDOSCOPY;  Service: Endoscopy;  Laterality: N/A;   ESOPHAGOGASTRODUODENOSCOPY (EGD) WITH PROPOFOL  N/A 04/24/2018   Procedure: ESOPHAGOGASTRODUODENOSCOPY (EGD) WITH PROPOFOL ;  Surgeon: Deveron Fly, MD;  Location: [facility] ENDOSCOPY;  Service: Endoscopy;  Laterality: N/A;   hysterectomy (other)     LEFT HEART CATH AND CORONARY ANGIOGRAPHY N/A 03/24/2020   Procedure: LEFT HEART CATH AND CORONARY ANGIOGRAPHY;  Surgeon: Sammy Crisp, MD;  Location: [facility] INVASIVE CV LAB;  Service: Cardiovascular;  Laterality: N/A;   Patient Active Problem List   Diagnosis Date Noted   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) 03/27/2023   Dysarthria 03/17/2023   Chronic obstructive pulmonary disease (HCC) 03/17/2023   Dizziness 02/23/2023   Dehydration 02/20/2023   Restrictive airway disease 07/21/2020   Partial thickness rotator cuff tear 03/20/2020   Shoulder pain 03/05/2020   Retinal detachment, left 03/04/2020   Ataxia 02/28/2020   Difficulty walking 02/28/2020   Physical deconditioning 02/28/2020   Chronic diastolic congestive heart failure (HCC) 11/20/2019   Polyp of ascending colon    Hypokalemia 08/08/2019   TIA (transient ischemic attack) 08/01/2019   Benign hypertensive kidney disease with chronic kidney disease 04/25/2019   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   Chronic, continuous use of opioids 03/26/2018   History of adenomatous polyp of colon 01/22/2018   Ulceration of colon determined by endoscopy 01/22/2018   Diverticulosis of colon 01/22/2018   History of CVA (cerebrovascular accident) 11/08/2016   Weakness of left upper extremity 10/30/2016   AAA (abdominal aortic aneurysm) without rupture (HCC) 08/12/2016   Barrett esophagus  07/21/2016   Stage 3b chronic kidney disease (HCC) 07/27/2015   Chest pain at rest 05/06/2015   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HCC) 10/28/2014   Solitary pulmonary nodule on lung CT 08/20/2014   Allergic rhinitis 07/28/2014   CAFL (chronic airflow limitation) (HCC) 07/28/2014   Acid reflux 07/28/2014   Granuloma annulare    Back pain 11/12/2013   Lumbar scoliosis 10/30/2013   Lumbar radiculopathy 10/30/2013   Lumbar canal stenosis 10/30/2013   Calcium  blood increased 10/22/2013   Proteinuria 10/22/2013   Obesity 03/21/2011   Smokes with greater than 30 pack year history 03/21/2011   Edema 08/18/2010   Hyperlipidemia 03/25/2009   Coronary artery disease 03/25/2009   Primary hypertension 03/25/2009    ONSET DATE: 03/17/23 (admitted with stroke), 05/23/23 (referral date)  REFERRING DIAG:  R41.841 (ICD-10-CM) - Cognitive communication deficit  I63.9 (ICD-10-CM) - CVA (cerebral vascular accident) (HCC)  R47.02 (ICD-10-CM) - Dysphasia    THERAPY DIAG:  Aphasia  Apraxia following other cerebrovascular disease  Rationale for Evaluation and Treatment Rehabilitation  SUBJECTIVE:   SUBJECTIVE STATEMENT: Pt alert, pleasant, and cooperative. On home O2.  Pt accompanied by: self, family member  PERTINENT HISTORY:  Pt is a 78  y.o. female who presents for communication evaluation in setting on stroke. Pt in ED 1/24-1/25/25. Pt d/c'd home with HH ST. PMHx significant of   HTN, CKD stage IIIB, DMT2 on insulin , CAD, chronic smoking, chronic respiratory failure with COPD and on home oxygen , anxiety, HLD. MRI 03/17/23 "1. Punctate foci of restricted diffusion in the left posterior  frontal white matter, most likely acute infarcts.  2. Numerous foci of hemosiderin deposition, which are seen in the  deep gray structures but also in the bilateral cerebral hemispheres,  with 1 new focus compared to 02/20/2023. While this could be the  sequela of chronic hypertensive  microhemorrhages, this appearance is  concerning for cerebral amyloid angiopathy."   PAIN:  Are you having pain? No  FALLS: defer to PT; under care of HH PT  LIVING ENVIRONMENT: Lives with: lives with their spouse Lives in: House/apartment  PLOF:  Level of assistance: Independent with ADLs Employment: Retired   PATIENT GOALS    for communication to improve   OBJECTIVE:  TODAY'S TREATMENT:  Pt and daughter educated re: rationale for SGD, benefits of SGD, and research supporting SGD use in persons wit aphasia. Pt and daughter agreeable to pursuing SGD trial.   Pt filled out paperwork on computer for SGD trial with mod/max A. Pt required min cues to biographical information (e.g. name, address, DOB). Max-total A for insurance information and daughter's contact information.   Reviewed supported communication. Pt participate in several conversational exchanges re: cruising/travel with supports such as written cues and maps.   PATIENT EDUCATION: Education details: as above Person educated: Patient and Child(ren) Education method: Explanation Education comprehension: verbalized understanding and needs further education  HOME EXERCISE PROGRAM:   Talk[employer] Therapy - on computer - if able to access  Worksheets flagged in workbooks from home  Practice SFA, supported communication   GOALS:  Goals reviewed with patient? Yes  SHORT TERM GOALS: Target date: 10 sessions  Pt will participate in further assessment of functional reading/writing. Baseline: Goal status: INITIAL  2.  With Moderate A, patient will complete a semantic feature analysis with at least 2 relevant features for 8/10 target words to improve word-finding skills.  Baseline:  Goal status: INITIAL  3.  With Maximal A, patient will generate sentences with 3 or more words in response to a situation at 80% accuracy in order to increase ability to communicate basic wants and needs.  Baseline:  Goal status:  INITIAL  4.   With Maximal A,  pt will follow 2-step commands for improved participation in ADLs/iADLs with max cueing. Baseline:  Goal status: INITIAL  5.  Pt will repeat short sentences (<4 words) with good approximations 80% of the time with max cueing. Baseline:  Goal status: INITIAL    LONG TERM GOALS: Target date: 12 weeks  Pt will report a subjective improvement in communication per PROM.  Baseline: CES 15/32 on 06/07/23 Goal status: INITIAL  2.  With Min A, patient/family will demonstrate understanding of the following concepts: aphasia, spontaneous recovery, communication vs conversation, strengths/strategies to promote success, local resources in order to increase patient's participation in medical care.    Baseline:  Goal status: INITIAL   ASSESSMENT:  CLINICAL IMPRESSION: Pt is a 78 y.o. female who presents for communication evaluation in setting on stroke. Pt in ED 1/24-1/25/25 for stroke. Pt d/c'd home with HH ST. PMHx significant of  HTN, CKD stage IIIB, DMT2 on insulin , CAD, chronic smoking, chronic respiratory failure with COPD and on home oxygen , anxiety,  HLD. Assessment completed via functional/dynamic means including selected subtests of Western Aphasia Battery Revised (WAB-R) and PROM (Communication Effectiveness Scale). 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 Pt with halting, telegraphic speech, mostly single words, paraphasias, neologisms, severe wordfinding difficulty. Similar difficulty noted with confrontation naming and divergent naming. Auditory comprehension is a relatively strength for pt as pt with intact single word recognition and ability to answer simple and complex yes/no questions. Increased difficulty noted with pt's ability to follow 2-step (moderately complex and complex) commands. Pt with impaired repetition at the word, phase, and sentence level. 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 SGD  evaluation completed 06/27/23 with SGD trial recommending. See details of assessment and tx session above. Recommend course of ST targeting functional communication, further assessment of functional reading/writing, and pt/caregiver training to help promote QoL and overall life participation.   OBJECTIVE IMPAIRMENTS include expressive language, receptive language, and aphasia. These impairments are limiting patient from ADLs/IADLs and effectively communicating at home and in community. Factors affecting potential to achieve goals and functional outcome are severity of impairments. Patient will benefit from skilled SLP services to address above impairments and improve overall function.  REHAB POTENTIAL: Good  PLAN: SLP FREQUENCY: 2x/week  SLP DURATION: 12 weeks  PLANNED INTERVENTIONS: Language facilitation, Cueing hierachy, Internal/external aids, Functional tasks, Multimodal communication approach, SLP instruction and feedback, Compensatory strategies, and Patient/family education    Dia Forget, M.S., CCC-SLP Speech-Language Pathologist [facility] [phone] [place])  [facility] [address] [place], [place], [address] Phone: [phone]   Fax:  [phone]

## 2023-07-04 ENCOUNTER — Ambulatory Visit

## 2023-07-04 ENCOUNTER — Ambulatory Visit: Payer: Self-pay

## 2023-07-04 ENCOUNTER — Ambulatory Visit: Admitting: Physical Therapy

## 2023-07-04 DIAGNOSIS — R262 Difficulty in walking, not elsewhere classified: Secondary | ICD-10-CM | POA: Diagnosis not present

## 2023-07-04 DIAGNOSIS — R4701 Aphasia: Secondary | ICD-10-CM

## 2023-07-04 DIAGNOSIS — R2681 Unsteadiness on feet: Secondary | ICD-10-CM

## 2023-07-04 DIAGNOSIS — R2689 Other abnormalities of gait and mobility: Secondary | ICD-10-CM

## 2023-07-04 DIAGNOSIS — R269 Unspecified abnormalities of gait and mobility: Secondary | ICD-10-CM | POA: Diagnosis not present

## 2023-07-04 DIAGNOSIS — I69890 Apraxia following other cerebrovascular disease: Secondary | ICD-10-CM | POA: Diagnosis not present

## 2023-07-04 DIAGNOSIS — M6281 Muscle weakness (generalized): Secondary | ICD-10-CM | POA: Diagnosis not present

## 2023-07-04 NOTE — Therapy (Signed)
 OUTPATIENT PHYSICAL THERAPY NEURO EVALUATION   Patient Name: Hannah Kirby MRN: [id number] DOB:1945/08/02, 78 y.o., female Today's Date: 07/04/2023   PCP:    Lamon Pillow, MD   REFERRING PROVIDER:    Morey Ar, NP     END OF SESSION:  PT End of Session - 07/04/23 1453     Visit Number 1    Number of Visits 24    Date for PT Re-Evaluation 09/26/23    Progress Note Due on Visit 10    PT Start Time 1449    PT Stop Time 1529    PT Time Calculation (min) 40 min    Equipment Utilized During Treatment Gait belt             Past Medical History:  Diagnosis Date   Acute hemorrhagic colitis 08/08/2019   Anemia    Anxiety    Arthritis    C. difficile colitis 08/09/2019   CAD (coronary artery disease)    a. 02/2006 PCI: BMS x 2 to RCA, cath o/w without significant coronary disease; b. nuclear stress test 07/2014: No ischemia/infarct; c. 11/2017 MV: no isch/infarct, EF 55-65%; d. 03/2020 NSTEMI/PCI: LM nl, LAD min irregs, RI 25, small, LCX nl, RCA 30p/m ISR, 99d (3.0x15 Resolute Onyx DES).   Cerebrovascular accident (CVA) (HCC) 03/18/2023   dysarthria   Chronic bronchitis (HCC)    secondary to cigarette smoking   Chronic kidney disease (CKD), stage III (moderate) (HCC)    COPD (chronic obstructive pulmonary disease) (HCC)    Diabetes mellitus    Diastolic dysfunction    a. 07/2019 Echo: EF 60-65%, no rwma, mod LVH. Nl RV size/fxn; b. 03/2020 Echo: EF 60-65%, mod LVH, gr1 DD, nl RV size/fxn, mild AS.   FHx: allergies    GERD (gastroesophageal reflux disease)    Goiter    Granulomatous disease (HCC)    Hernia    Hyperlipidemia    Hypertension    Kidney stone on left side 2013   Microalbuminuria    NSTEMI (non-ST elevated myocardial infarction) (HCC) 03/23/2020   Obesity    Panic attacks    PVC's (premature ventricular contractions)    a. 03/2018 [facility]: Occas PVCs (2.5%). Triggered events assoc w/ PVC/PAC.   Retinal tear 2020   Smokers' cough (HCC)     Spinal stenosis    Stroke (HCC) 10/29/2016   mild left side weakness   Stroke (HCC) 08/01/2019   Tobacco abuse    Past Surgical History:  Procedure Laterality Date   ABDOMINAL HYSTERECTOMY     BREAST SURGERY     CATARACT EXTRACTION W/PHACO Right 03/01/2017   Procedure: CATARACT EXTRACTION PHACO AND INTRAOCULAR LENS PLACEMENT (IOC) RIGHT DIABETIC;  Surgeon: Annell Kidney, MD;  Location: [facility];  Service: Ophthalmology;  Laterality: Right;   CATARACT EXTRACTION W/PHACO Left 03/22/2017   Procedure: CATARACT EXTRACTION PHACO AND INTRAOCULAR LENS PLACEMENT (IOC) LEFT DIABETIC;  Surgeon: Annell Kidney, MD;  Location: [facility];  Service: Ophthalmology;  Laterality: Left;  Diabetic - insulin  and oral meds   COLONOSCOPY WITH PROPOFOL  N/A 09/23/2014   Procedure: COLONOSCOPY WITH PROPOFOL ;  Surgeon: Deveron Fly, MD;  Location: [facility] END[facility]COPY;  Service: Endoscopy;  Laterality: N/A;   COLONOSCOPY WITH PROPOFOL  N/A 01/11/2018   Procedure: COLONOSCOPY WITH PROPOFOL ;  Surgeon: Deveron Fly, MD;  Location: [facility] ENDOSCOPY;  Service: Endoscopy;  Laterality: N/A;   COLONOSCOPY WITH PROPOFOL  N/A 04/24/2018   Procedure: COLONOSCOPY WITH PROPOFOL ;  Surgeon: Deveron Fly, MD;  Location:  [facility] ENDOSCOPY;  Service: Endoscopy;  Laterality: N/A;   COLONOSCOPY WITH PROPOFOL  N/A 11/19/2019   Procedure: COLONOSCOPY WITH PROPOFOL ;  Surgeon: Marnee Sink, MD;  Location: [facility];  Service: Endoscopy;  Laterality: N/A;   CORONARY ANGIOPLASTY WITH STENT PLACEMENT  2008   CORONARY STENT INTERVENTION N/A 03/24/2020   Procedure: CORONARY STENT INTERVENTION;  Surgeon: Sammy Crisp, MD;  Location: [facility] INVASIVE CV LAB;  Service: Cardiovascular;  Laterality: N/A;   ESOPHAGOGASTRODUODENOSCOPY (EGD) WITH PROPOFOL  N/A 12/30/2014   Procedure: ESOPHAGOGASTRODUODENOSCOPY (EGD) WITH PROPOFOL ;  Surgeon: Deveron Fly, MD;  Location: [facility] ENDOSCOPY;  Service: Endoscopy;  Laterality:  N/A;   ESOPHAGOGASTRODUODENOSCOPY (EGD) WITH PROPOFOL  N/A 07/19/2016   Procedure: ESOPHAGOGASTRODUODENOSCOPY (EGD) WITH PROPOFOL ;  Surgeon: Deveron Fly, MD;  Location: [facility] ENDOSCOPY;  Service: Endoscopy;  Laterality: N/A;   ESOPHAGOGASTRODUODENOSCOPY (EGD) WITH PROPOFOL  N/A 04/24/2018   Procedure: ESOPHAGOGASTRODUODENOSCOPY (EGD) WITH PROPOFOL ;  Surgeon: Deveron Fly, MD;  Location: [facility] ENDOSCOPY;  Service: Endoscopy;  Laterality: N/A;   hysterectomy (other)     LEFT HEART CATH AND CORONARY ANGIOGRAPHY N/A 03/24/2020   Procedure: LEFT HEART CATH AND CORONARY ANGIOGRAPHY;  Surgeon: Sammy Crisp, MD;  Location: [facility] INVASIVE CV LAB;  Service: Cardiovascular;  Laterality: N/A;   Patient Active Problem List   Diagnosis Date Noted   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) 03/27/2023   Dysarthria 03/17/2023   Chronic obstructive pulmonary disease (HCC) 03/17/2023   Dizziness 02/23/2023   Dehydration 02/20/2023   Restrictive airway disease 07/21/2020   Partial thickness rotator cuff tear 03/20/2020   Shoulder pain 03/05/2020   Retinal detachment, left 03/04/2020   Ataxia 02/28/2020   Difficulty walking 02/28/2020   Physical deconditioning 02/28/2020   Chronic diastolic congestive heart failure (HCC) 11/20/2019   Polyp of ascending colon    Hypokalemia 08/08/2019   TIA (transient ischemic attack) 08/01/2019   Benign hypertensive kidney disease with chronic kidney disease 04/25/2019   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   Chronic, continuous use of opioids 03/26/2018   History of adenomatous polyp of colon 01/22/2018   Ulceration of colon determined by endoscopy 01/22/2018   Diverticulosis of colon 01/22/2018   History of CVA (cerebrovascular accident) 11/08/2016   Weakness of left upper extremity 10/30/2016   AAA (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) 08/12/2016   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   Stage 3b chronic kidney disease (HCC) 07/27/2015   Chest pain at rest 05/06/2015   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HCC) 10/28/2014   Solitary pulmonary nodule on lung CT 08/20/2014   Allergic rhinitis 07/28/2014   CAFL (chronic airflow limitation) (HCC) 07/28/2014   Acid reflux 07/28/2014   Granuloma annulare    Back pain 11/12/2013   Lumbar scoliosis 10/30/2013   Lumbar radiculopathy 10/30/2013   Lumbar canal stenosis 10/30/2013   Calcium  blood increased 10/22/2013   Proteinuria 10/22/2013   Obesity 03/21/2011   Smokes with greater than 30 pack year history 03/21/2011   Edema 08/18/2010   Hyperlipidemia 03/25/2009   Coronary artery disease 03/25/2009   Primary hypertension 03/25/2009    ONSET DATE: 03/17/23  REFERRING DIAG:  R53.1 (ICD-10-CM) - Left-sided weakness  I63.9 (ICD-10-CM) - Cerebrovascular accident (CVA), unspecified mechanism (HCC)    THERAPY DIAG:  Abnormality of gait and mobility  Difficulty in walking, not elsewhere classified  Muscle weakness (generalized)  Other abnormalities of gait and mobility  Unsteadiness on feet  Rationale for  Evaluation and Treatment: Rehabilitation  SUBJECTIVE:                                                                                                                                                                                             SUBJECTIVE STATEMENT:  Pt presents with DTR and portable oxygen  tank, uses 2-3 L of oxygen  when using it. Pt has word finding difficulty making subjective info somewhat limited. PT provides her with time to answer to the best of her ability. Pt instructed we could use oxygen  tank provided here if needed. Pt was doing HH PT working on strength, balance, mobility generally. Pt has had no falls in last 6 months. Pt states she would like to improve her ability to move around more. Pt wants to gain  independence to perform cooking activity, gardening, etc. Pt feels limited mostly due to endurance with standing.   Pt accompanied by: self and family member  PERTINENT HISTORY: Pmx of CVA, COPD, Dysarthria, Ataxia, TIA, T2DM, Back pain, scoliosis   PAIN:  Are you having pain? Yes: NPRS scale: 3 Pain location: low back Aggravating factors: standing long time, sleeping    PRECAUTIONS: Fall  RED FLAGS: None   WEIGHT BEARING RESTRICTIONS: No  FALLS: Has patient fallen in last 6 months? No  LIVING ENVIRONMENT: Lives with: lives with their family and DTR comes by every day to assist with things  Lives in: House/apartment Stairs: Yes: External: 1 steps; none Has following equipment at home: Single point cane and Walker - 4 wheeled  PLOF: Independent with basic ADLs and Independent with household mobility with device  PATIENT GOALS: Improve mobility and function.   OBJECTIVE:  Note: Objective measures were completed at Evaluation unless otherwise noted.  DIAGNOSTIC FINDINGS: MRI 03/17/23 "1. Punctate foci of restricted diffusion in the left posterior  frontal white matter, most likely acute infarcts.  2. Numerous foci of hemosiderin deposition, which are seen in the  deep gray structures but also in the bilateral cerebral hemispheres,  with 1 new focus compared to 02/20/2023. While this could be the  sequela of chronic hypertensive microhemorrhages, this appearance is  concerning for cerebral amyloid angiopathy."  COGNITION: Overall cognitive status: difficulty with word finding, responds appropriately    POSTURE: Rounded Shoulders   LOWER EXTREMITY ROM:     WNL for tasks assessed   LOWER EXTREMITY MMT:    MMT Right Eval Left Eval  Hip flexion 3+ 4  Hip extension    Hip abduction 4 4+  Hip adduction 4+ 5  Hip internal rotation    Hip external rotation    Knee flexion 4-  4+  Knee extension 4- 4+  Ankle dorsiflexion 4 4+  Ankle plantarflexion 4+ 5  Ankle  inversion    Ankle eversion    (Blank rows = not tested)   TRANSFERS: Sit to stand: Modified independence  Assistive device utilized: None     Stand to sit: Modified independence  Assistive device utilized: None     Chair to chair: Modified independence  Assistive device utilized: None       RAMP:  Not tested  CURB:  Not tested  STAIRS: Not tested GAIT: Findings: Distance walked: 33 ft and Comments: slow speed, wide BOS, short step length  FUNCTIONAL TESTS:  5 times sit to stand: 42.28 sec SpO2 response: 94-95%, heavy UE use  Timed up and go (TUG): 24 sec  2 minute walk test: test visit 2 Berg Balance Scale: 39 : .39 m/s Patient demonstrates increased fall risk as noted by score of  39 /56 on Berg Balance Scale.  (<36= high risk for falls, close to 100%; 37-45 significant >80%; 46-51 moderate >50%; 52-55 lower >25%)  [facility] PT Assessment - 07/04/23 0001       Standardized Balance Assessment   Standardized Balance Assessment Berg Balance Test      Berg Balance Test   Sit to Stand Able to stand  independently using hands    Standing Unsupported Able to stand safely 2 minutes    Sitting with Back Unsupported but Feet Supported on Floor or Stool Able to sit safely and securely 2 minutes    Stand to Sit Controls descent by using hands    Transfers Able to transfer safely, definite need of hands    Standing Unsupported with Eyes Closed Able to stand 10 seconds safely    Standing Unsupported with Feet Together Able to place feet together independently and stand 1 minute safely    From Standing, Reach Forward with Outstretched Arm Can reach forward >12 cm safely (5")    From Standing Position, Pick up Object from Floor Able to pick up shoe, needs supervision    From Standing Position, Turn to Look Behind Over each Shoulder Turn sideways only but maintains balance    Turn 360 Degrees Able to turn 360 degrees safely but slowly    Standing Unsupported, Alternately Place Feet on  Step/Stool Able to complete >2 steps/needs minimal assist    Standing Unsupported, One Foot in Front Able to take small step independently and hold 30 seconds    Standing on One Leg Tries to lift leg/unable to hold 3 seconds but remains standing independently    Total Score 39              PATIENT SURVEYS:  Stroke Impact Scale 16 : 45/80                                                                                                                              TREATMENT DATE: 07/04/23   SELF CARE  Patient instructed in plan of care, findings for evaluation, and ways of physical therapy may improve their function and quality of life.     PATIENT EDUCATION: Education details: POC Person educated: Patient Education method: Explanation Education comprehension: verbalized understanding   HOME EXERCISE PROGRAM: Establish visit 2    GOALS: Goals reviewed with patient? Yes  SHORT TERM GOALS: Target date: 08/01/2023       Patient will be independent in home exercise program to improve strength/mobility for better functional independence with ADLs. Baseline: No HEP currently  Goal status: INITIAL   LONG TERM GOALS: Target date: 09/26/2023   1.  Patient (> 14 years old) will complete five times sit to stand test in < 20 seconds indicating an increased LE strength and improved balance. Baseline: 42.28 Goal status: INITIAL  2.  Patient will improve SIS 16 score to 55   to demonstrate statistically significant improvement in mobility and quality of life as it relates to their CVA.  Baseline: 45 Goal status: INITIAL   3.  Patient will increase Berg Balance score by > 6 points to demonstrate decreased fall risk during functional activities. Baseline: 39 Goal status: INITIAL   4.   Patient will reduce timed up and go to <11 seconds to reduce fall risk and demonstrate improved transfer/gait ability. Baseline: 24 sec Goal status: INITIAL  5.   Patient will increase 10  meter walk test to >.76m/s as to improve gait speed for better community ambulation and to reduce fall risk. Baseline: .38 m/s Goal status: INITIAL  6.   Patient will increase 2 minute walk test distance by 50 ft or greater for progression to community ambulator and demonstrate improved gait ability Baseline: Test visit 2  Goal status: INITIAL    ASSESSMENT:  CLINICAL IMPRESSION: Patient is a 79 y.o. F who was seen today for physical therapy evaluation and treatment for CVA related impairments.  Patient presents with deficits that are creating increased risk of falls as evidenced by 5 times sit to stand, timed up and go and Berg balance scale.  In addition 5 times sit to stand shows significant lower extremity power and strength deficits likely creating many challenges with activities within the home.  Patient also shows significant decrease in gait speed as evidenced by 10 m walk test showing impaired ability to navigate in community settings safely and efficiently.  Patient will benefit from skilled physical therapy to address her impairments, improve her lower extremity strength particularly on the left side, improve her balance, endurance, and overall quality of life.  OBJECTIVE IMPAIRMENTS: Abnormal gait, cardiopulmonary status limiting activity, decreased activity tolerance, decreased balance, decreased endurance, decreased mobility, difficulty walking, and decreased strength.   ACTIVITY LIMITATIONS: carrying, lifting, standing, squatting, stairs, transfers, toileting, and locomotion level  PARTICIPATION LIMITATIONS: meal prep, cleaning, laundry, community activity, and yard work  PERSONAL FACTORS: Age and 3+ comorbidities: Pmx of CVA, COPD, Dysarthria, Ataxia, TIA, T2DM, Back pain, scoliosis  are also affecting patient's functional outcome.   REHAB POTENTIAL: Good  CLINICAL DECISION MAKING: Evolving/moderate complexity  EVALUATION COMPLEXITY: Moderate  PLAN:  PT FREQUENCY:  2x/week  PT DURATION: 12 weeks  PLANNED INTERVENTIONS: 97750- Physical Performance Testing, 97110-Therapeutic exercises, 97530- Therapeutic activity, 97112- Neuromuscular re-education, 97535- Self Care, 16109- Manual therapy, [phone]- Gait training, Patient/Family education, Balance training, Stair training, and Moist heat  PLAN FOR NEXT SESSION: , monitor SPO2 and get portable oxygen  if necessary  Endurance, balance, strength ( L>R), administer HEP, gait training    Veryl Gottron  Hassell Linsey, PT 07/04/2023, 4:32 PM

## 2023-07-04 NOTE — Telephone Encounter (Signed)
 Chief Complaint: SOB Symptoms: productive cough, intermittent wheezing, SOB,  Frequency: worsening x 2 weeks Pertinent Negatives: Patient denies lower extremity swelling, chest pain, fever. Disposition: [] ED /[] Urgent Care (no appt availability in office) / [x] Appointment(In office/virtual)/ []  [facility]/ [] Home Care/ [] Refused Recommended Disposition /[] [place] [facility]/ []  Follow-up with PCP Additional Notes: Called patient's daughter, Anell Baptist, busy signal. Called patient and spoke with her husband, patient states she is having a "snore test" done and can't speak right now. Patient seen at urgent care on 06/25/23, given antibiotic and states she does not feel improved. Patient declined offer for acute visit this afternoon with available provider and states she already has appts this afternoon. Patient agreeable to come in tomorrow and advised she go to ED for any worsening symptoms.  Copied from CRM [id number]. Topic: Clinical - Medical Advice >> Jul 04, 2023 11:14 AM Everlene Hobby D wrote: Patient takes Omega-3 Fatty Acids (FISH OIL ) 1000 MG CAPS but her daughter is not sure if she's taking the right amount.  Says patient went to urgent care but her nose is still running and the antibiotics that was given isn't helping that. She wants to know if she can get something to dry it up. Anell Baptist patient's daughter -[phone] Reason for Disposition  [1] Longstanding difficulty breathing (e.g., CHF, COPD, emphysema) AND [2] WORSE than normal  Answer Assessment - Initial Assessment Questions 1. RESPIRATORY STATUS: "Describe your breathing?" (e.g., wheezing, shortness of breath, unable to speak, severe coughing)      Shortness of breath, coughing spells, wheezing.  2. ONSET: "When did this breathing problem begin?"      X 2 weeks.  3. PATTERN "Does the difficult breathing come and go, or has it been constant since it started?"      Comes and goes.  4. SEVERITY: "How bad is your breathing?"  (e.g., mild, moderate, severe)    - MILD: No SOB at rest, mild SOB with walking, speaks normally in sentences, can lie down, no retractions, pulse < 100.    - MODERATE: SOB at rest, SOB with minimal exertion and prefers to sit, cannot lie down flat, speaks in phrases, mild retractions, audible wheezing, pulse 100-120.    - SEVERE: Very SOB at rest, speaks in single words, struggling to breathe, sitting hunched forward, retractions, pulse > 120      Moderate.  5. RECURRENT SYMPTOM: "Have you had difficulty breathing before?" If Yes, ask: "When was the last time?" and "What happened that time?"      Yes, COPD history.  6. CARDIAC HISTORY: "Do you have any history of heart disease?" (e.g., heart attack, angina, bypass surgery, angioplasty)      CHF.  7. LUNG HISTORY: "Do you have any history of lung disease?"  (e.g., pulmonary embolus, asthma, emphysema)     COPD.  8. CAUSE: "What do you think is causing the breathing problem?"      Unsure. Patient states this doesn't feel like a COPD flare up.  9. OTHER SYMPTOMS: "Do you have any other symptoms? (e.g., dizziness, runny nose, cough, chest pain, fever)     Nasal congestion, productive cough with amber color.  10. O2 SATURATION MONITOR:  "Do you use an oxygen  saturation monitor (pulse oximeter) at home?" If Yes, ask: "What is your reading (oxygen  level) today?" "What is your usual oxygen  saturation reading?" (e.g., 95%)       They have one available at home but patient hasn't checked today but last time it was in  the 90s.  11. PREGNANCY: "Is there any chance you are pregnant?" "When was your last menstrual period?"       N/A.  12. TRAVEL: "Have you traveled out of the country in the last month?" (e.g., travel history, exposures)       No.  Protocols used: Breathing Difficulty-A-AH

## 2023-07-04 NOTE — Telephone Encounter (Signed)
 Resend to [employer].

## 2023-07-04 NOTE — Telephone Encounter (Signed)
 Copied from CRM [id number]. Topic: Clinical - Medication Question >> Jul 04, 2023 11:09 AM Everlene Hobby D wrote: Would like patient's medication to go to [facility] - [place], [place] - [address] [facility] [address] [place] [address] [place] [address] Phone: [phone] Fax: [phone] Hours: Not open 24 hours   felodipine  (PLENDIL ) 5 MG 24 hr tablet sertraline (ZOLOFT) 25 MG tablet >> Jul 04, 2023 11:11 AM Everlene Hobby D wrote: Says her medications need to be sent here and not [facility]

## 2023-07-04 NOTE — Therapy (Deleted)
 OUTPATIENT SPEECH LANGUAGE PATHOLOGY APHASIA TREATMENT   Patient Name: Hannah Kirby MRN: [id number] DOB:06-17-1945, 78 y.o., female Today's Date: 07/04/2023  PCP: Jeralene Mom, MD REFERRING PROVIDER: Jeralene Mom, MD   End of Session - 07/04/23 1453     Visit Number 7    Number of Visits 24    Date for SLP Re-Evaluation 08/30/23    SLP Start Time 1400    SLP Stop Time  1445    SLP Time Calculation (min) 45 min    Activity Tolerance Patient tolerated treatment well   Simultaneous filing. User may not have seen previous data.            Past Medical History:  Diagnosis Date   Acute hemorrhagic colitis 08/08/2019   Anemia    Anxiety    Arthritis    C. difficile colitis 08/09/2019   CAD (coronary artery disease)    a. 02/2006 PCI: BMS x 2 to RCA, cath o/w without significant coronary disease; b. nuclear stress test 07/2014: No ischemia/infarct; c. 11/2017 MV: no isch/infarct, EF 55-65%; d. 03/2020 NSTEMI/PCI: LM nl, LAD min irregs, RI 25, small, LCX nl, RCA 30p/m ISR, 99d (3.0x15 Resolute Onyx DES).   Cerebrovascular accident (CVA) (HCC) 03/18/2023   dysarthria   Chronic bronchitis (HCC)    secondary to cigarette smoking   Chronic kidney disease (CKD), stage III (moderate) (HCC)    COPD (chronic obstructive pulmonary disease) (HCC)    Diabetes mellitus    Diastolic dysfunction    a. 07/2019 Echo: EF 60-65%, no rwma, mod LVH. Nl RV size/fxn; b. 03/2020 Echo: EF 60-65%, mod LVH, gr1 DD, nl RV size/fxn, mild AS.   FHx: allergies    GERD (gastroesophageal reflux disease)    Goiter    Granulomatous disease (HCC)    Hernia    Hyperlipidemia    Hypertension    Kidney stone on left side 2013   Microalbuminuria    NSTEMI (non-ST elevated myocardial infarction) (HCC) 03/23/2020   Obesity    Panic attacks    PVC's (premature ventricular contractions)    a. 03/2018 Zio: Occas PVCs (2.5%). Triggered events assoc w/ PVC/PAC.   Retinal tear 2020   Smokers' cough (HCC)     Spinal stenosis    Stroke (HCC) 10/29/2016   mild left side weakness   Stroke (HCC) 08/01/2019   Tobacco abuse    Past Surgical History:  Procedure Laterality Date   ABDOMINAL HYSTERECTOMY     BREAST SURGERY     CATARACT EXTRACTION W/PHACO Right 03/01/2017   Procedure: CATARACT EXTRACTION PHACO AND INTRAOCULAR LENS PLACEMENT (IOC) RIGHT DIABETIC;  Surgeon: Annell Kidney, MD;  Location: [facility];  Service: Ophthalmology;  Laterality: Right;   CATARACT EXTRACTION W/PHACO Left 03/22/2017   Procedure: CATARACT EXTRACTION PHACO AND INTRAOCULAR LENS PLACEMENT (IOC) LEFT DIABETIC;  Surgeon: Annell Kidney, MD;  Location: [facility];  Service: Ophthalmology;  Laterality: Left;  Diabetic - insulin  and oral meds   COLONOSCOPY WITH PROPOFOL  N/A 09/23/2014   Procedure: COLONOSCOPY WITH PROPOFOL ;  Surgeon: Deveron Fly, MD;  Location: [facility] ENDOSCOPY;  Service: Endoscopy;  Laterality: N/A;   COLONOSCOPY WITH PROPOFOL  N/A 01/11/2018   Procedure: COLONOSCOPY WITH PROPOFOL ;  Surgeon: Deveron Fly, MD;  Location: [facility] ENDOSCOPY;  Service: Endoscopy;  Laterality: N/A;   COLONOSCOPY WITH PROPOFOL  N/A 04/24/2018   Procedure: COLONOSCOPY WITH PROPOFOL ;  Surgeon: Deveron Fly, MD;  Location: [facility] END[facility]COPY;  Service: Endoscopy;  Laterality: N/A;   COLONOSCOPY WITH PROPOFOL  N/A  11/19/2019   Procedure: COLONOSCOPY WITH PROPOFOL ;  Surgeon: Marnee Sink, MD;  Location: [facility] ENDOSCOPY;  Service: Endoscopy;  Laterality: N/A;   CORONARY ANGIOPLASTY WITH STENT PLACEMENT  2008   CORONARY STENT INTERVENTION N/A 03/24/2020   Procedure: CORONARY STENT INTERVENTION;  Surgeon: Sammy Crisp, MD;  Location: [facility] INVASIVE CV LAB;  Service: Cardiovascular;  Laterality: N/A;   ESOPHAGOGASTRODUODENOSCOPY (EGD) WITH PROPOFOL  N/A 12/30/2014   Procedure: ESOPHAGOGASTRODUODENOSCOPY (EGD) WITH PROPOFOL ;  Surgeon: Deveron Fly, MD;  Location: [facility] ENDOSCOPY;  Service: Endoscopy;  Laterality:  N/A;   ESOPHAGOGASTRODUODENOSCOPY (EGD) WITH PROPOFOL  N/A 07/19/2016   Procedure: ESOPHAGOGASTRODUODENOSCOPY (EGD) WITH PROPOFOL ;  Surgeon: Deveron Fly, MD;  Location: [facility] ENDOSCOPY;  Service: Endoscopy;  Laterality: N/A;   ESOPHAGOGASTRODUODENOSCOPY (EGD) WITH PROPOFOL  N/A 04/24/2018   Procedure: ESOPHAGOGASTRODUODENOSCOPY (EGD) WITH PROPOFOL ;  Surgeon: Deveron Fly, MD;  Location: [facility] ENDOSCOPY;  Service: Endoscopy;  Laterality: N/A;   hysterectomy (other)     LEFT HEART CATH AND CORONARY ANGIOGRAPHY N/A 03/24/2020   Procedure: LEFT HEART CATH AND CORONARY ANGIOGRAPHY;  Surgeon: Sammy Crisp, MD;  Location: [facility] INVASIVE CV LAB;  Service: Cardiovascular;  Laterality: N/A;   Patient Active Problem List   Diagnosis Date Noted   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) 03/27/2023   Dysarthria 03/17/2023   Chronic obstructive pulmonary disease (HCC) 03/17/2023   Dizziness 02/23/2023   Dehydration 02/20/2023   Restrictive airway disease 07/21/2020   Partial thickness rotator cuff tear 03/20/2020   Shoulder pain 03/05/2020   Retinal detachment, left 03/04/2020   Ataxia 02/28/2020   Difficulty walking 02/28/2020   Physical deconditioning 02/28/2020   Chronic diastolic congestive heart failure (HCC) 11/20/2019   Polyp of ascending colon    Hypokalemia 08/08/2019   TIA (transient ischemic attack) 08/01/2019   Benign hypertensive kidney disease with chronic kidney disease 04/25/2019   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   Chronic, continuous use of opioids 03/26/2018   History of adenomatous polyp of colon 01/22/2018   Ulceration of colon determined by endoscopy 01/22/2018   Diverticulosis of colon 01/22/2018   History of CVA (cerebrovascular accident) 11/08/2016   Weakness of left upper extremity 10/30/2016   AAA (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) 08/12/2016   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   Stage 3b chronic kidney disease (HCC) 07/27/2015   Chest pain at rest 05/06/2015   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HCC) 10/28/2014   Solitary pulmonary nodule on lung CT 08/20/2014   Allergic rhinitis 07/28/2014   CAFL (chronic airflow limitation) (HCC) 07/28/2014   Acid reflux 07/28/2014   Granuloma annulare    Back pain 11/12/2013   Lumbar scoliosis 10/30/2013   Lumbar radiculopathy 10/30/2013   Lumbar canal stenosis 10/30/2013   Calcium  blood increased 10/22/2013   Proteinuria 10/22/2013   Obesity 03/21/2011   Smokes with greater than 30 pack year history 03/21/2011   Edema 08/18/2010   Hyperlipidemia 03/25/2009   Coronary artery disease 03/25/2009   Primary hypertension 03/25/2009    ONSET DATE: 03/17/23 (admitted with stroke), 05/23/23 (referral date)  REFERRING DIAG:  R41.841 (ICD-10-CM) - Cognitive communication deficit  I63.9 (ICD-10-CM) - CVA (cerebral vascular accident) (HCC)  R47.02 (ICD-10-CM) - Dysphasia    THERAPY DIAG:  Aphasia  Apraxia following other cerebrovascular disease  Rationale for Evaluation and Treatment Rehabilitation  SUBJECTIVE:   SUBJECTIVE STATEMENT: Pt alert, pleasant, and cooperative. On home O2.  Pt accompanied by: self,  family member  PERTINENT HISTORY:  Pt is a 78 y.o. female who presents for communication evaluation in setting on stroke. Pt in ED 1/24-1/25/25. Pt d/c'd home with HH ST. PMHx significant of   HTN, CKD stage IIIB, DMT2 on insulin , CAD, chronic smoking, chronic respiratory failure with COPD and on home oxygen , anxiety, HLD. MRI 03/17/23 "1. Punctate foci of restricted diffusion in the left posterior  frontal white matter, most likely acute infarcts.  2. Numerous foci of hemosiderin deposition, which are seen in the  deep gray structures but also in the bilateral cerebral hemispheres,  with 1 new focus compared to 02/20/2023. While  this could be the  sequela of chronic hypertensive microhemorrhages, this appearance is  concerning for cerebral amyloid angiopathy."   PAIN:  Are you having pain? No  FALLS: defer to PT; under care of HH PT  LIVING ENVIRONMENT: Lives with: lives with their spouse Lives in: House/apartment  PLOF:  Level of assistance: Independent with ADLs Employment: Retired   PATIENT GOALS    for communication to improve   OBJECTIVE:  TODAY'S TREATMENT:  Pt and daughter educated re: rationale for SGD, benefits of SGD, and research supporting SGD use in persons wit aphasia. Pt and daughter agreeable to pursuing SGD trial. Additional information provided per [employer]'s request via encrypted email with pt's approval.  Reviewed supported communication and overall communication strategies. Pt participate in several conversational exchanges re: preferred topics including movies, music, and reading. Pt benefits from written prompts, choices, and hierarchcial verbal cues. Pt able to generate x3 favorite movies, x2 movie artists, and discuss favorite book subjects with cueing.  Pt and daughter educated on importance of saliency in therapeutic targets and the emphasis of treatment on Life Participation. Handouts and websites provided with additional resources for extended family about how to "speak" aphasia.    PATIENT EDUCATION: Education details: as above Person educated: Patient and Child(ren) Education method: Explanation Education comprehension: verbalized understanding and needs further education  HOME EXERCISE PROGRAM:   [facility] Therapy - on computer - if able to access  Worksheets flagged in workbooks from home  Practice SFA, supported communication   GOALS:  Goals reviewed with patient? Yes  SHORT TERM GOALS: Target date: 10 sessions  Pt will participate in further assessment of functional reading/writing. Baseline: Goal status: INITIAL  2.  With Moderate A, patient will  complete a semantic feature analysis with at least 2 relevant features for 8/10 target words to improve word-finding skills.  Baseline:  Goal status: INITIAL  3.  With Maximal A, patient will generate sentences with 3 or more words in response to a situation at 80% accuracy in order to increase ability to communicate basic wants and needs.  Baseline:  Goal status: INITIAL  4.   With Maximal A,  pt will follow 2-step commands for improved participation in ADLs/iADLs with max cueing. Baseline:  Goal status: INITIAL  5.  Pt will repeat short sentences (<4 words) with good approximations 80% of the time with max cueing. Baseline:  Goal status: INITIAL    LONG TERM GOALS: Target date: 12 weeks  Pt will report a subjective improvement in communication per PROM.  Baseline: CES 15/32 on 06/07/23 Goal status: INITIAL  2.  With Min A, patient/family will demonstrate understanding of the following concepts: aphasia, spontaneous recovery, communication vs conversation, strengths/strategies to promote success, local resources in order to increase patient's participation in medical care.    Baseline:  Goal status: INITIAL   ASSESSMENT:  CLINICAL  IMPRESSION: Pt is a 78 y.o. female who presents for communication evaluation in setting on stroke. Pt in ED 1/24-1/25/25 for stroke. Pt d/c'd home with HH ST. PMHx significant of  HTN, CKD stage IIIB, DMT2 on insulin , CAD, chronic smoking, chronic respiratory failure with COPD and on home oxygen , anxiety, HLD. Assessment completed via functional/dynamic means including selected subtests of Western Aphasia Battery Revised (WAB-R) and PROM (Communication Effectiveness Scale). 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 Pt with halting, telegraphic speech, mostly single words, paraphasias, neologisms, severe wordfinding difficulty. Similar difficulty noted with confrontation naming and divergent naming. Auditory comprehension is a  relatively strength for pt as pt with intact single word recognition and ability to answer simple and complex yes/no questions. Increased difficulty noted with pt's ability to follow 2-step (moderately complex and complex) commands. Pt with impaired repetition at the word, phase, and sentence level. 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 SGD evaluation completed 06/27/23 with SGD trial recommending. See details of assessment and tx session above. Recommend course of ST targeting functional communication, further assessment of functional reading/writing, and pt/caregiver training to help promote QoL and overall life participation.   OBJECTIVE IMPAIRMENTS include expressive language, receptive language, and aphasia. These impairments are limiting patient from ADLs/IADLs and effectively communicating at home and in community. Factors affecting potential to achieve goals and functional outcome are severity of impairments. Patient will benefit from skilled SLP services to address above impairments and improve overall function.  REHAB POTENTIAL: Good  PLAN: SLP FREQUENCY: 2x/week  SLP DURATION: 12 weeks  PLANNED INTERVENTIONS: Language facilitation, Cueing hierachy, Internal/external aids, Functional tasks, Multimodal communication approach, SLP instruction and feedback, Compensatory strategies, and Patient/family education    Dia Forget, M.S., CCC-SLP Speech-Language Pathologist [facility] [phone] [place])  [facility] at [facility] [address] [place], [place], [address] Phone: [phone]   Fax:  [phone]

## 2023-07-04 NOTE — Therapy (Signed)
 OUTPATIENT SPEECH LANGUAGE PATHOLOGY APHASIA TREATMENT   Patient Name: Hannah Kirby MRN: [id number] DOB:06-17-1945, 78 y.o., female Today's Date: 07/04/2023  PCP: Jeralene Mom, MD REFERRING PROVIDER: Jeralene Mom, MD   End of Session - 07/04/23 1453     Visit Number 7    Number of Visits 24    Date for SLP Re-Evaluation 08/30/23    SLP Start Time 1400    SLP Stop Time  1445    SLP Time Calculation (min) 45 min    Activity Tolerance Patient tolerated treatment well   Simultaneous filing. User may not have seen previous data.            Past Medical History:  Diagnosis Date   Acute hemorrhagic colitis 08/08/2019   Anemia    Anxiety    Arthritis    C. difficile colitis 08/09/2019   CAD (coronary artery disease)    a. 02/2006 PCI: BMS x 2 to RCA, cath o/w without significant coronary disease; b. nuclear stress test 07/2014: No ischemia/infarct; c. 11/2017 MV: no isch/infarct, EF 55-65%; d. 03/2020 NSTEMI/PCI: LM nl, LAD min irregs, RI 25, small, LCX nl, RCA 30p/m ISR, 99d (3.0x15 Resolute Onyx DES).   Cerebrovascular accident (CVA) (HCC) 03/18/2023   dysarthria   Chronic bronchitis (HCC)    secondary to cigarette smoking   Chronic kidney disease (CKD), stage III (moderate) (HCC)    COPD (chronic obstructive pulmonary disease) (HCC)    Diabetes mellitus    Diastolic dysfunction    a. 07/2019 Echo: EF 60-65%, no rwma, mod LVH. Nl RV size/fxn; b. 03/2020 Echo: EF 60-65%, mod LVH, gr1 DD, nl RV size/fxn, mild AS.   FHx: allergies    GERD (gastroesophageal reflux disease)    Goiter    Granulomatous disease (HCC)    Hernia    Hyperlipidemia    Hypertension    Kidney stone on left side 2013   Microalbuminuria    NSTEMI (non-ST elevated myocardial infarction) (HCC) 03/23/2020   Obesity    Panic attacks    PVC's (premature ventricular contractions)    a. 03/2018 [employer]io: Occas PVCs (2.5%). Triggered events assoc w/ PVC/PAC.   Retinal tear 2020   Smokers' cough (HCC)     Spinal stenosis    Stroke (HCC) 10/29/2016   mild left side weakness   Stroke (HCC) 08/01/2019   Tobacco abuse    Past Surgical History:  Procedure Laterality Date   ABDOMINAL HYSTERECTOMY     BREAST SURGERY     CATARACT EXTRACTION W/PHACO Right 03/01/2017   Procedure: CATARACT EXTRACTION PHACO AND INTRAOCULAR LENS PLACEMENT (IOC) RIGHT DIABETIC;  Surgeon: Annell Kidney, MD;  Location: [facility];  Service: Ophthalmology;  Laterality: Right;   CATARACT EXTRACTION W/PHACO Left 03/22/2017   Procedure: CATARACT EXTRACTION PHACO AND INTRAOCULAR LENS PLACEMENT (IOC) LEFT DIABETIC;  Surgeon: Annell Kidney, MD;  Location: [facility];  Service: Ophthalmology;  Laterality: Left;  Diabetic - insulin  and oral meds   COLONOSCOPY WITH PROPOFOL  N/A 09/23/2014   Procedure: COLONOSCOPY WITH PROPOFOL ;  Surgeon: Deveron Fly, MD;  Location: [facility] ENDOSCOPY;  Service: Endoscopy;  Laterality: N/A;   COLONOSCOPY WITH PROPOFOL  N/A 01/11/2018   Procedure: COLONOSCOPY WITH PROPOFOL ;  Surgeon: Deveron Fly, MD;  Location: [facility] ENDOSCOPY;  Service: Endoscopy;  Laterality: N/A;   COLONOSCOPY WITH PROPOFOL  N/A 04/24/2018   Procedure: COLONOSCOPY WITH PROPOFOL ;  Surgeon: Deveron Fly, MD;  Location: [facility] END[facility]COPY;  Service: Endoscopy;  Laterality: N/A;   COLONOSCOPY WITH PROPOFOL  N/A  11/19/2019   Procedure: COLONOSCOPY WITH PROPOFOL ;  Surgeon: Marnee Sink, MD;  Location: [facility] ENDOSCOPY;  Service: Endoscopy;  Laterality: N/A;   CORONARY ANGIOPLASTY WITH STENT PLACEMENT  2008   CORONARY STENT INTERVENTION N/A 03/24/2020   Procedure: CORONARY STENT INTERVENTION;  Surgeon: Sammy Crisp, MD;  Location: [facility] INVASIVE CV LAB;  Service: Cardiovascular;  Laterality: N/A;   ESOPHAGOGASTRODUODENOSCOPY (EGD) WITH PROPOFOL  N/A 12/30/2014   Procedure: ESOPHAGOGASTRODUODENOSCOPY (EGD) WITH PROPOFOL ;  Surgeon: Deveron Fly, MD;  Location: [facility] ENDOSCOPY;  Service: Endoscopy;  Laterality:  N/A;   ESOPHAGOGASTRODUODENOSCOPY (EGD) WITH PROPOFOL  N/A 07/19/2016   Procedure: ESOPHAGOGASTRODUODENOSCOPY (EGD) WITH PROPOFOL ;  Surgeon: Deveron Fly, MD;  Location: [facility] ENDOSCOPY;  Service: Endoscopy;  Laterality: N/A;   ESOPHAGOGASTRODUODENOSCOPY (EGD) WITH PROPOFOL  N/A 04/24/2018   Procedure: ESOPHAGOGASTRODUODENOSCOPY (EGD) WITH PROPOFOL ;  Surgeon: Deveron Fly, MD;  Location: [facility] ENDOSCOPY;  Service: Endoscopy;  Laterality: N/A;   hysterectomy (other)     LEFT HEART CATH AND CORONARY ANGIOGRAPHY N/A 03/24/2020   Procedure: LEFT HEART CATH AND CORONARY ANGIOGRAPHY;  Surgeon: Sammy Crisp, MD;  Location: [facility] INVASIVE CV LAB;  Service: Cardiovascular;  Laterality: N/A;   Patient Active Problem List   Diagnosis Date Noted   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) 03/27/2023   Dysarthria 03/17/2023   Chronic obstructive pulmonary disease (HCC) 03/17/2023   Dizziness 02/23/2023   Dehydration 02/20/2023   Restrictive airway disease 07/21/2020   Partial thickness rotator cuff tear 03/20/2020   Shoulder pain 03/05/2020   Retinal detachment, left 03/04/2020   Ataxia 02/28/2020   Difficulty walking 02/28/2020   Physical deconditioning 02/28/2020   Chronic diastolic congestive heart failure (HCC) 11/20/2019   Polyp of ascending colon    Hypokalemia 08/08/2019   TIA (transient ischemic attack) 08/01/2019   Benign hypertensive kidney disease with chronic kidney disease 04/25/2019   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   Chronic, continuous use of opioids 03/26/2018   History of adenomatous polyp of colon 01/22/2018   Ulceration of colon determined by endoscopy 01/22/2018   Diverticulosis of colon 01/22/2018   History of CVA (cerebrovascular accident) 11/08/2016   Weakness of left upper extremity 10/30/2016   AAA (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) 08/12/2016   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   Stage 3b chronic kidney disease (HCC) 07/27/2015   Chest pain at rest 05/06/2015   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HCC) 10/28/2014   Solitary pulmonary nodule on lung CT 08/20/2014   Allergic rhinitis 07/28/2014   CAFL (chronic airflow limitation) (HCC) 07/28/2014   Acid reflux 07/28/2014   Granuloma annulare    Back pain 11/12/2013   Lumbar scoliosis 10/30/2013   Lumbar radiculopathy 10/30/2013   Lumbar canal stenosis 10/30/2013   Calcium  blood increased 10/22/2013   Proteinuria 10/22/2013   Obesity 03/21/2011   Smokes with greater than 30 pack year history 03/21/2011   Edema 08/18/2010   Hyperlipidemia 03/25/2009   Coronary artery disease 03/25/2009   Primary hypertension 03/25/2009    ONSET DATE: 03/17/23 (admitted with stroke), 05/23/23 (referral date)  REFERRING DIAG:  R41.841 (ICD-10-CM) - Cognitive communication deficit  I63.9 (ICD-10-CM) - CVA (cerebral vascular accident) (HCC)  R47.02 (ICD-10-CM) - Dysphasia    THERAPY DIAG:  Aphasia  Apraxia following other cerebrovascular disease  Rationale for Evaluation and Treatment Rehabilitation  SUBJECTIVE:   SUBJECTIVE STATEMENT: Pt alert, pleasant, and cooperative. On home O2.  Pt accompanied by: self,  family member  PERTINENT HISTORY:  Pt is a 78 y.o. female who presents for communication evaluation in setting on stroke. Pt in ED 1/24-1/25/25. Pt d/c'd home with HH ST. PMHx significant of   HTN, CKD stage IIIB, DMT2 on insulin , CAD, chronic smoking, chronic respiratory failure with COPD and on home oxygen , anxiety, HLD. MRI 03/17/23 "1. Punctate foci of restricted diffusion in the left posterior  frontal white matter, most likely acute infarcts.  2. Numerous foci of hemosiderin deposition, which are seen in the  deep gray structures but also in the bilateral cerebral hemispheres,  with 1 new focus compared to 02/20/2023. While  this could be the  sequela of chronic hypertensive microhemorrhages, this appearance is  concerning for cerebral amyloid angiopathy."   PAIN:  Are you having pain? No  FALLS: defer to PT; under care of HH PT  LIVING ENVIRONMENT: Lives with: lives with their spouse Lives in: House/apartment  PLOF:  Level of assistance: Independent with ADLs Employment: Retired   PATIENT GOALS    for communication to improve   OBJECTIVE:  TODAY'S TREATMENT:  Pt and daughter educated re: rationale for SGD, benefits of SGD, and research supporting SGD use in persons wit aphasia. Pt and daughter agreeable to pursuing SGD trial. Additional information provided per [employer]'s request via encrypted email with pt's approval.  Reviewed supported communication and overall communication strategies. Pt participate in several conversational exchanges re: preferred topics including movies, music, and reading. Pt benefits from written prompts, choices, and hierarchcial verbal cues. Pt able to generate x3 favorite movies, x2 movie artists, and discuss favorite book subjects with cueing.  Pt and daughter educated on importance of saliency in therapeutic targets and the emphasis of treatment on Life Participation. Handouts and websites provided with additional resources for extended family about how to "speak" aphasia.    PATIENT EDUCATION: Education details: as above Person educated: Patient and Child(ren) Education method: Explanation Education comprehension: verbalized understanding and needs further education  HOME EXERCISE PROGRAM:   Talk[facility] Therapy - on computer - if able to access  Worksheets flagged in workbooks from home  Practice SFA, supported communication   GOALS:  Goals reviewed with patient? Yes  SHORT TERM GOALS: Target date: 10 sessions  Pt will participate in further assessment of functional reading/writing. Baseline: Goal status: INITIAL  2.  With Moderate A, patient will  complete a semantic feature analysis with at least 2 relevant features for 8/10 target words to improve word-finding skills.  Baseline:  Goal status: INITIAL  3.  With Maximal A, patient will generate sentences with 3 or more words in response to a situation at 80% accuracy in order to increase ability to communicate basic wants and needs.  Baseline:  Goal status: INITIAL  4.   With Maximal A,  pt will follow 2-step commands for improved participation in ADLs/iADLs with max cueing. Baseline:  Goal status: INITIAL  5.  Pt will repeat short sentences (<4 words) with good approximations 80% of the time with max cueing. Baseline:  Goal status: INITIAL    LONG TERM GOALS: Target date: 12 weeks  Pt will report a subjective improvement in communication per PROM.  Baseline: CES 15/32 on 06/07/23 Goal status: INITIAL  2.  With Min A, patient/family will demonstrate understanding of the following concepts: aphasia, spontaneous recovery, communication vs conversation, strengths/strategies to promote success, local resources in order to increase patient's participation in medical care.    Baseline:  Goal status: INITIAL   ASSESSMENT:  CLINICAL  IMPRESSION: Pt is a 78 y.o. female who presents for communication evaluation in setting on stroke. Pt in ED 1/24-1/25/25 for stroke. Pt d/c'd home with HH ST. PMHx significant of  HTN, CKD stage IIIB, DMT2 on insulin , CAD, chronic smoking, chronic respiratory failure with COPD and on home oxygen , anxiety, HLD. Assessment completed via functional/dynamic means including selected subtests of Western Aphasia Battery Revised (WAB-R) and PROM (Communication Effectiveness Scale). 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 Pt with halting, telegraphic speech, mostly single words, paraphasias, neologisms, severe wordfinding difficulty. Similar difficulty noted with confrontation naming and divergent naming. Auditory comprehension is a  relatively strength for pt as pt with intact single word recognition and ability to answer simple and complex yes/no questions. Increased difficulty noted with pt's ability to follow 2-step (moderately complex and complex) commands. Pt with impaired repetition at the word, phase, and sentence level. 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 SGD evaluation completed 06/27/23 with SGD trial recommending. See details of assessment and tx session above. Recommend course of ST targeting functional communication, further assessment of functional reading/writing, and pt/caregiver training to help promote QoL and overall life participation.   OBJECTIVE IMPAIRMENTS include expressive language, receptive language, and aphasia. These impairments are limiting patient from ADLs/IADLs and effectively communicating at home and in community. Factors affecting potential to achieve goals and functional outcome are severity of impairments. Patient will benefit from skilled SLP services to address above impairments and improve overall function.  REHAB POTENTIAL: Good  PLAN: SLP FREQUENCY: 2x/week  SLP DURATION: 12 weeks  PLANNED INTERVENTIONS: Language facilitation, Cueing hierachy, Internal/external aids, Functional tasks, Multimodal communication approach, SLP instruction and feedback, Compensatory strategies, and Patient/family education    Dia Forget, M.S., CCC-SLP Speech-Language Pathologist [facility] [phone] [place])  [facility] at [facility] [address] [place], [place], [address] Phone: [phone]   Fax:  [phone]

## 2023-07-05 ENCOUNTER — Encounter: Payer: Self-pay | Admitting: Family Medicine

## 2023-07-05 ENCOUNTER — Ambulatory Visit (INDEPENDENT_AMBULATORY_CARE_PROVIDER_SITE_OTHER): Admitting: Family Medicine

## 2023-07-05 VITALS — BP 136/85 | HR 76 | Ht 64.0 in | Wt 158.0 lb

## 2023-07-05 DIAGNOSIS — J440 Chronic obstructive pulmonary disease with acute lower respiratory infection: Secondary | ICD-10-CM | POA: Diagnosis not present

## 2023-07-05 DIAGNOSIS — J209 Acute bronchitis, unspecified: Secondary | ICD-10-CM | POA: Diagnosis not present

## 2023-07-05 MED ORDER — AZITHROMYCIN 250 MG PO TABS: ORAL_TABLET | ORAL | 0 refills | Status: AC

## 2023-07-05 MED ORDER — CEFDINIR 300 MG PO CAPS
300.0000 mg | ORAL_CAPSULE | Freq: Two times a day (BID) | ORAL | 0 refills | Status: AC
Start: 2023-07-05 — End: 2023-07-10

## 2023-07-05 NOTE — Progress Notes (Unsigned)
 Established patient visit   Patient: Hannah Kirby   DOB: [birth year]   78 y.o. Female  MRN: [id number] Visit Date: 07/05/2023  Today's healthcare provider: Mimi Alt, MD   Chief Complaint  Patient presents with   Sinusitis    Sinus pressure, and running nose, some coughing,   Subjective     HPI     Sinusitis    Additional comments: Sinus pressure, and running nose, some coughing,      Last edited by Bart Lieu, CMA on 07/05/2023  2:23 PM.       Discussed the use of AI scribe software for clinical note transcription with the patient, who gave verbal consent to proceed.  History of Present Illness Hannah Kirby is a 78 year old female with COPD who presents with sinus pressure and runny nose.  She has been experiencing sinus pressure, runny nose, and coughing for at least six weeks. The runny nose is particularly bothersome, affecting her daily activities such as wearing oxygen  and enjoying meals. The nasal discharge varies from clear to containing 'gunk'.  She was treated for a COPD exacerbation at urgent care on Jun 25, 2023, with Augmentin  500-125 mg for ten days and a cough medication. Her cough and wheezing have improved somewhat since then, but she continues to experience sinus pressure and nasal discharge. Her breathing has been more labored recently, and she experiences shortness of breath more frequently than usual. A chest x-ray performed during her urgent care visit reportedly showed no abnormalities. Her caregiver notes slight improvement in her breathing since starting antibiotics, but it is not back to her baseline.  She has a history of allergies to sulfa antibiotics, spironolactone , codeine, and prednisone . She is hesitant to take prednisone  due to a past allergic reaction. She has been taking Mucinex as needed, and her caregiver has been administering quercetin for high blood pressure and cold symptoms. Her history of kidney disease  has made her caregiver cautious about administering certain allergy medications, such as Xyzal, due to potential contraindications.  No significant headache or facial tenderness. Breathing has improved but remains labored, and she experiences shortness of breath. History of wheezing, which has improved. No significant ear pain but reports some ear discomfort.     Past Medical History:  Diagnosis Date   Acute hemorrhagic colitis 08/08/2019   Anemia    Anxiety    Arthritis    C. difficile colitis 08/09/2019   CAD (coronary artery disease)    a. 02/2006 PCI: BMS x 2 to RCA, cath o/w without significant coronary disease; b. nuclear stress test 07/2014: No ischemia/infarct; c. 11/2017 MV: no isch/infarct, EF 55-65%; d. 03/2020 NSTEMI/PCI: LM nl, LAD min irregs, RI 25, small, LCX nl, RCA 30p/m ISR, 99d (3.0x15 Resolute Onyx DES).   Cerebrovascular accident (CVA) (HCC) 03/18/2023   dysarthria   Chronic bronchitis (HCC)    secondary to cigarette smoking   Chronic kidney disease (CKD), stage III (moderate) (HCC)    COPD (chronic obstructive pulmonary disease) (HCC)    Diabetes mellitus    Diastolic dysfunction    a. 07/2019 Echo: EF 60-65%, no rwma, mod LVH. Nl RV size/fxn; b. 03/2020 Echo: EF 60-65%, mod LVH, gr1 DD, nl RV size/fxn, mild AS.   FHx: allergies    GERD (gastroesophageal reflux disease)    Goiter    Granulomatous disease (HCC)    Hernia    Hyperlipidemia    Hypertension    Kidney stone on left  side 2013   Microalbuminuria    NSTEMI (non-ST elevated myocardial infarction) (HCC) 03/23/2020   Obesity    Panic attacks    PVC's (premature ventricular contractions)    a. 03/2018 [facility]io: Occas PVCs (2.5%). Triggered events assoc w/ PVC/PAC.   Retinal tear 2020   Smokers' cough (HCC)    Spinal stenosis    Stroke (HCC) 10/29/2016   mild left side weakness   Stroke (HCC) 08/01/2019   Tobacco abuse     Medications: Outpatient Medications Prior to Visit  Medication Sig    acetaminophen  (TYLENOL ) 500 MG tablet Take 500 mg by mouth every 6 (six) hours as needed.   albuterol  (VENTOLIN  HFA) 108 (90 Base) MCG/ACT inhaler Inhale 2 puffs into the lungs every 4 (four) hours as needed for wheezing or shortness of breath.   aspirin  (ASPIR-81) 81 MG EC tablet Take 81 mg by mouth daily.   benzonatate  (TESSALON ) 100 MG capsule Take 1 capsule (100 mg total) by mouth every 8 (eight) hours.   calcium  carbonate (TUMS - DOSED IN MG ELEMENTAL CALCIUM ) 500 MG chewable tablet Chew 1 tablet by mouth daily as needed.   clopidogrel  (PLAVIX ) 75 MG tablet Take 1 tablet (75 mg total) by mouth daily.   cyanocobalamin  1000 MCG tablet Take 1,000 mcg by mouth daily.   dapagliflozin  propanediol (FARXIGA ) 5 MG TABS tablet Take 1 tablet (5 mg total) by mouth daily before breakfast. (Patient taking differently: Take 2.5 mg by mouth 2 (two) times daily.)   Docusate Sodium  (DSS) 100 MG CAPS Take 1 capsule by mouth daily.   ezetimibe  (ZETIA ) 10 MG tablet TAKE ONE TABLET BY MOUTH EVERY DAY   felodipine  (PLENDIL ) 5 MG 24 hr tablet TAKE 1 TABLET BY MOUTH DAILY   Finerenone (KERENDIA) 10 MG TABS Take 10 mg by mouth.   Fluticasone -Umeclidin-Vilant (TRELEGY ELLIPTA ) 100-62.5-25 MCG/ACT AEPB INHALE 1 PUFF BY MOUTH EVERY DAY - DISCARD DEVICE 6 WEEKS AFTER IT IS REMOVED FROM THE FOIL TRAY OR WHEN THE DOSE COUNTER READS &quot;0&quot; (WHICHEVER COMES FIRST)   HYDROcodone -acetaminophen  (NORCO) 7.5-325 MG tablet Take 1 tablet by mouth every 6 (six) hours as needed for moderate pain (pain score 4-6).   insulin  glargine (LANTUS ) 100 UNIT/ML injection Inject 22 Units into the skin daily.   lisinopril  (ZESTRIL ) 20 MG tablet Take 1 tablet (20 mg total) by mouth daily.   meclizine  (ANTIVERT ) 25 MG tablet Take 25 mg by mouth 3 (three) times daily as needed.   metoprolol  tartrate (LOPRESSOR ) 100 MG tablet Take 1 tablet (100 mg total) by mouth 2 (two) times daily.   Multiple Vitamins-Minerals (DAILY MULTI) TABS Take 1  tablet by mouth daily.   nitroGLYCERIN  (NITROSTAT ) 0.4 MG SL tablet Place 1 tablet (0.4 mg total) under the tongue every 5 (five) minutes as needed.   Omega-3 Fatty Acids (FISH OIL ) 1000 MG CAPS Take 2 capsules by mouth in the morning and at bedtime.    ondansetron  (ZOFRAN  ODT) 4 MG disintegrating tablet Take 1 tablet (4 mg total) by mouth every 8 (eight) hours as needed for nausea or vomiting.   RABEprazole  (ACIPHEX ) 20 MG tablet Take 1 tablet (20 mg total) by mouth daily. 15 Mins. before evening meal   rosuvastatin  (CRESTOR ) 40 MG tablet Take 1 tablet (40 mg total) by mouth daily.   Semaglutide,0.25 or 0.5MG /DOS, 2 MG/1.5ML SOPN Inject 0.5 mg into the skin once a week.   sertraline (ZOLOFT) 25 MG tablet Take 1/2 tablet once daily for one week, then inrease to  1 tablet once daily and continue   sucralfate  (CARAFATE ) 1 g tablet Take 1 tablet (1 g total) by mouth 2 (two) times daily. 15 Minutes before evening meal and at bed time   [DISCONTINUED] amoxicillin -clavulanate (AUGMENTIN ) 500-125 MG tablet Take 1 tablet by mouth in the morning and at bedtime.   mirtazapine  (REMERON ) 7.5 MG tablet Take 1 tablet (7.5 mg total) by mouth at bedtime. (Patient not taking: Reported on 07/05/2023)   No facility-administered medications prior to visit.    Review of Systems      Objective    BP 136/85   Pulse 76   Ht 5\' 4"  (1.626 m)   Wt 158 lb (71.7 kg)   SpO2 97%   BMI 27.12 kg/m  BP Readings from Last 3 Encounters:  07/05/23 136/85  06/26/23 115/64  06/25/23 (!) 148/94   Wt Readings from Last 3 Encounters:  07/05/23 158 lb (71.7 kg)  06/26/23 160 lb 3.2 oz (72.7 kg)  05/10/23 158 lb (71.7 kg)        Physical Exam HENT:     Right Ear: Tympanic membrane normal.     Left Ear: Tympanic membrane normal.     Nose: Rhinorrhea present.     Right Sinus: No maxillary sinus tenderness or frontal sinus tenderness.     Left Sinus: No maxillary sinus tenderness or frontal sinus tenderness.      Mouth/Throat:     Mouth: Mucous membranes are moist.     Physical Exam CHEST: No wheezing.    No results found for any visits on 07/05/23.  Assessment & Plan     Problem List Items Addressed This Visit   None Visit Diagnoses       Acute bronchitis with COPD (HCC)    -  Primary   Relevant Medications   azithromycin  (ZITHROMAX ) 250 MG tablet   cefdinir  (OMNICEF ) 300 MG capsule        Assessment & Plan COPD exacerbation COPD exacerbation treated with Augmentin  and cough medication. Symptoms include sinus pressure, rhinorrhea, and cough. Breathing has improved but remains unresolved. No wheezing on exam. Symptoms suggest a viral etiology. Persistent dyspnea may be related to COPD. Previous chest x-ray showed no abnormalities. - Prescribe cefdinir  300 mg twice daily - Prescribe azithromycin  250 mg, 500 mg on day one, then 250 mg on days two through five - Follow up with PCP in two weeks  Allergic rhinitis Persistent rhinorrhea and sinus pressure for at least six weeks. Symptoms include clear to cloudy nasal discharge. Allergic rhinitis suspected but complicated by chronic kidney disease, limiting medication options. Xyzal was considered but not prescribed due to contraindications with kidney disease.  Chronic kidney disease Chronic kidney disease limits medication options for allergic rhinitis. Xyzal was considered but not prescribed due to contraindications with kidney disease.  Allergy to sulfa antibiotics, spironolactone , codeine, and prednisone  Allergies to sulfa antibiotics, spironolactone , codeine, and prednisone  noted. Prednisone  avoided due to previous allergic reaction.     Return in about 2 weeks (around 07/19/2023) for COPD .         Mimi Alt, MD  [facility] [phone] (phone) [phone] (fax)  [facility]

## 2023-07-06 ENCOUNTER — Ambulatory Visit

## 2023-07-11 ENCOUNTER — Ambulatory Visit: Admitting: Physical Therapy

## 2023-07-11 ENCOUNTER — Ambulatory Visit

## 2023-07-11 ENCOUNTER — Encounter (INDEPENDENT_AMBULATORY_CARE_PROVIDER_SITE_OTHER): Payer: Self-pay

## 2023-07-11 DIAGNOSIS — I69890 Apraxia following other cerebrovascular disease: Secondary | ICD-10-CM | POA: Diagnosis not present

## 2023-07-11 DIAGNOSIS — R269 Unspecified abnormalities of gait and mobility: Secondary | ICD-10-CM | POA: Diagnosis not present

## 2023-07-11 DIAGNOSIS — R4701 Aphasia: Secondary | ICD-10-CM | POA: Diagnosis not present

## 2023-07-11 DIAGNOSIS — R2689 Other abnormalities of gait and mobility: Secondary | ICD-10-CM | POA: Diagnosis not present

## 2023-07-11 DIAGNOSIS — M6281 Muscle weakness (generalized): Secondary | ICD-10-CM | POA: Diagnosis not present

## 2023-07-11 DIAGNOSIS — R262 Difficulty in walking, not elsewhere classified: Secondary | ICD-10-CM | POA: Diagnosis not present

## 2023-07-11 DIAGNOSIS — R2681 Unsteadiness on feet: Secondary | ICD-10-CM

## 2023-07-11 NOTE — Therapy (Signed)
 OUTPATIENT SPEECH LANGUAGE PATHOLOGY APHASIA TREATMENT   Patient Name: Hannah Kirby MRN: [id number] DOB:1945/09/19, 78 y.o., female Today's Date: 07/04/2023  PCP: Jeralene Mom, MD REFERRING PROVIDER: Jeralene Mom, MD   End of Session - 07/04/23 1453     Visit Number 7    Number of Visits 24    Date for SLP Re-Evaluation 08/30/23    SLP Start Time 1400    SLP Stop Time  1445    SLP Time Calculation (min) 45 min    Activity Tolerance Patient tolerated treatment well   Simultaneous filing. User may not have seen previous data.            Past Medical History:  Diagnosis Date   Acute hemorrhagic colitis 08/08/2019   Anemia    Anxiety    Arthritis    C. difficile colitis 08/09/2019   CAD (coronary artery disease)    a. 02/2006 PCI: BMS x 2 to RCA, cath o/w without significant coronary disease; b. nuclear stress test 07/2014: No ischemia/infarct; c. 11/2017 MV: no isch/infarct, EF 55-65%; d. 03/2020 NSTEMI/PCI: LM nl, LAD min irregs, RI 25, small, LCX nl, RCA 30p/m ISR, 99d (3.0x15 Resolute Onyx DES).   Cerebrovascular accident (CVA) (HCC) 03/18/2023   dysarthria   Chronic bronchitis (HCC)    secondary to cigarette smoking   Chronic kidney disease (CKD), stage III (moderate) (HCC)    COPD (chronic obstructive pulmonary disease) (HCC)    Diabetes mellitus    Diastolic dysfunction    a. 07/2019 Echo: EF 60-65%, no rwma, mod LVH. Nl RV size/fxn; b. 03/2020 Echo: EF 60-65%, mod LVH, gr1 DD, nl RV size/fxn, mild AS.   FHx: allergies    GERD (gastroesophageal reflux disease)    Goiter    Granulomatous disease (HCC)    Hernia    Hyperlipidemia    Hypertension    Kidney stone on left side 2013   Microalbuminuria    NSTEMI (non-ST elevated myocardial infarction) (HCC) 03/23/2020   Obesity    Panic attacks    PVC's (premature ventricular contractions)    a. 03/2018 Zio: Occas PVCs (2.5%). Triggered events assoc w/ PVC/PAC.   Retinal tear 2020   Smokers' cough (HCC)     Spinal stenosis    Stroke (HCC) 10/29/2016   mild left side weakness   Stroke (HCC) 08/01/2019   Tobacco abuse    Past Surgical History:  Procedure Laterality Date   ABDOMINAL HYSTERECTOMY     BREAST SURGERY     CATARACT EXTRACTION W/PHACO Right 03/01/2017   Procedure: CATARACT EXTRACTION PHACO AND INTRAOCULAR LENS PLACEMENT (IOC) RIGHT DIABETIC;  Surgeon: Annell Kidney, MD;  Location: [facility];  Service: Ophthalmology;  Laterality: Right;   CATARACT EXTRACTION W/PHACO Left 03/22/2017   Procedure: CATARACT EXTRACTION PHACO AND INTRAOCULAR LENS PLACEMENT (IOC) LEFT DIABETIC;  Surgeon: Annell Kidney, MD;  Location: [facility];  Service: Ophthalmology;  Laterality: Left;  Diabetic - insulin  and oral meds   COLONOSCOPY WITH PROPOFOL  N/A 09/23/2014   Procedure: COLONOSCOPY WITH PROPOFOL ;  Surgeon: Deveron Fly, MD;  Location: [facility] ENDOSCOPY;  Service: Endoscopy;  Laterality: N/A;   COLONOSCOPY WITH PROPOFOL  N/A 01/11/2018   Procedure: COLONOSCOPY WITH PROPOFOL ;  Surgeon: Deveron Fly, MD;  Location: [facility] ENDOSCOPY;  Service: Endoscopy;  Laterality: N/A;   COLONOSCOPY WITH PROPOFOL  N/A 04/24/2018   Procedure: COLONOSCOPY WITH PROPOFOL ;  Surgeon: Deveron Fly, MD;  Location: [facility] ENDOSCOPY;  Service: Endoscopy;  Laterality: N/A;   COLONOSCOPY WITH PROPOFOL  N/A  11/19/2019   Procedure: COLONOSCOPY WITH PROPOFOL ;  Surgeon: Marnee Sink, MD;  Location: [facility] ENDOSCOPY;  Service: Endoscopy;  Laterality: N/A;   CORONARY ANGIOPLASTY WITH STENT PLACEMENT  2008   CORONARY STENT INTERVENTION N/A 03/24/2020   Procedure: CORONARY STENT INTERVENTION;  Surgeon: Sammy Crisp, MD;  Location: [facility] INVASIVE CV LAB;  Service: Cardiovascular;  Laterality: N/A;   ESOPHAGOGASTRODUODENOSCOPY (EGD) WITH PROPOFOL  N/A 12/30/2014   Procedure: ESOPHAGOGASTRODUODENOSCOPY (EGD) WITH PROPOFOL ;  Surgeon: Deveron Fly, MD;  Location: [facility] ENDOSCOPY;  Service: Endoscopy;  Laterality:  N/A;   ESOPHAGOGASTRODUODENOSCOPY (EGD) WITH PROPOFOL  N/A 07/19/2016   Procedure: ESOPHAGOGASTRODUODENOSCOPY (EGD) WITH PROPOFOL ;  Surgeon: Deveron Fly, MD;  Location: [facility] ENDOSCOPY;  Service: Endoscopy;  Laterality: N/A;   ESOPHAGOGASTRODUODENOSCOPY (EGD) WITH PROPOFOL  N/A 04/24/2018   Procedure: ESOPHAGOGASTRODUODENOSCOPY (EGD) WITH PROPOFOL ;  Surgeon: Deveron Fly, MD;  Location: [facility] ENDOSCOPY;  Service: Endoscopy;  Laterality: N/A;   hysterectomy (other)     LEFT HEART CATH AND CORONARY ANGIOGRAPHY N/A 03/24/2020   Procedure: LEFT HEART CATH AND CORONARY ANGIOGRAPHY;  Surgeon: Sammy Crisp, MD;  Location: [facility] INVASIVE CV LAB;  Service: Cardiovascular;  Laterality: N/A;   Patient Active Problem List   Diagnosis Date Noted   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) 03/27/2023   Dysarthria 03/17/2023   Chronic obstructive pulmonary disease (HCC) 03/17/2023   Dizziness 02/23/2023   Dehydration 02/20/2023   Restrictive airway disease 07/21/2020   Partial thickness rotator cuff tear 03/20/2020   Shoulder pain 03/05/2020   Retinal detachment, left 03/04/2020   Ataxia 02/28/2020   Difficulty walking 02/28/2020   Physical deconditioning 02/28/2020   Chronic diastolic congestive heart failure (HCC) 11/20/2019   Polyp of ascending colon    Hypokalemia 08/08/2019   TIA (transient ischemic attack) 08/01/2019   Benign hypertensive kidney disease with chronic kidney disease 04/25/2019   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   Chronic, continuous use of opioids 03/26/2018   History of adenomatous polyp of colon 01/22/2018   Ulceration of colon determined by endoscopy 01/22/2018   Diverticulosis of colon 01/22/2018   History of CVA (cerebrovascular accident) 11/08/2016   Weakness of left upper extremity 10/30/2016   AAA (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) 08/12/2016   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   Stage 3b chronic kidney disease (HCC) 07/27/2015   Chest pain at rest 05/06/2015   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HCC) 10/28/2014   Solitary pulmonary nodule on lung CT 08/20/2014   Allergic rhinitis 07/28/2014   CAFL (chronic airflow limitation) (HCC) 07/28/2014   Acid reflux 07/28/2014   Granuloma annulare    Back pain 11/12/2013   Lumbar scoliosis 10/30/2013   Lumbar radiculopathy 10/30/2013   Lumbar canal stenosis 10/30/2013   Calcium  blood increased 10/22/2013   Proteinuria 10/22/2013   Obesity 03/21/2011   Smokes with greater than 30 pack year history 03/21/2011   Edema 08/18/2010   Hyperlipidemia 03/25/2009   Coronary artery disease 03/25/2009   Primary hypertension 03/25/2009    ONSET DATE: 03/17/23 (admitted with stroke), 05/23/23 (referral date)  REFERRING DIAG:  R41.841 (ICD-10-CM) - Cognitive communication deficit  I63.9 (ICD-10-CM) - CVA (cerebral vascular accident) (HCC)  R47.02 (ICD-10-CM) - Dysphasia    THERAPY DIAG:  Aphasia  Apraxia following other cerebrovascular disease  Rationale for Evaluation and Treatment Rehabilitation  SUBJECTIVE:   SUBJECTIVE STATEMENT: Pt alert, pleasant, and cooperative. On home O2.  Pt accompanied by: self,  family member  PERTINENT HISTORY:  Pt is a 78 y.o. female who presents for communication evaluation in setting on stroke. Pt in ED 1/24-1/25/25. Pt d/c'd home with HH ST. PMHx significant of   HTN, CKD stage IIIB, DMT2 on insulin , CAD, chronic smoking, chronic respiratory failure with COPD and on home oxygen , anxiety, HLD. MRI 03/17/23 "1. Punctate foci of restricted diffusion in the left posterior  frontal white matter, most likely acute infarcts.  2. Numerous foci of hemosiderin deposition, which are seen in the  deep gray structures but also in the bilateral cerebral hemispheres,  with 1 new focus compared to 02/20/2023. While  this could be the  sequela of chronic hypertensive microhemorrhages, this appearance is  concerning for cerebral amyloid angiopathy."   PAIN:  Are you having pain? No  FALLS: defer to PT; under care of HH PT  LIVING ENVIRONMENT: Lives with: lives with their spouse Lives in: House/apartment  PLOF:  Level of assistance: Independent with ADLs Employment: Retired   PATIENT GOALS    for communication to improve   OBJECTIVE:  TODAY'S TREATMENT:  Pt and daughter educated re: rationale for SGD, benefits of SGD, and research supporting SGD use in persons wit aphasia. Pt and daughter agreeable to pursuing SGD trial. Additional information provided per [employer]'s request via encrypted email with pt's approval last session. Pt has not heard back from [place], will follow up.  Reviewed supported communication and overall communication strategies. Pt participate in several conversational exchanges re: preferred topics. Pt benefits from written prompts, choices, and hierarchical verbal cues. Pt generated sentence level responses with mod cueing.   Pt followed 2-step commands with 90% accuracy indep, improve to 100% with repetition.   Pt repeated 4-5 word phrases with 75% accuracy indep with mod/max cueing, 3 word phrases wit 100% accuracy and extra time.     PATIENT EDUCATION: Education details: as above Person educated: Patient and Child(ren) Education method: Explanation Education comprehension: verbalized understanding and needs further education  HOME EXERCISE PROGRAM:   [facility] Therapy - on computer - if able to access  Worksheets flagged in workbooks from home  Practice SFA, supported communication   GOALS:  Goals reviewed with patient? Yes  SHORT TERM GOALS: Target date: 10 sessions  Pt will participate in further assessment of functional reading/writing. Baseline: Goal status: INITIAL  2.  With Moderate A, patient will complete a semantic feature analysis  with at least 2 relevant features for 8/10 target words to improve word-finding skills.  Baseline:  Goal status: INITIAL  3.  With Maximal A, patient will generate sentences with 3 or more words in response to a situation at 80% accuracy in order to increase ability to communicate basic wants and needs.  Baseline:  Goal status: INITIAL  4.   With Maximal A,  pt will follow 2-step commands for improved participation in ADLs/iADLs with max cueing. Baseline:  Goal status: INITIAL  5.  Pt will repeat short sentences (<4 words) with good approximations 80% of the time with max cueing. Baseline:  Goal status: INITIAL    LONG TERM GOALS: Target date: 12 weeks  Pt will report a subjective improvement in communication per PROM.  Baseline: CES 15/32 on 06/07/23 Goal status: INITIAL  2.  With Min A, patient/family will demonstrate understanding of the following concepts: aphasia, spontaneous recovery, communication vs conversation, strengths/strategies to promote success, local resources in order to increase patient's participation in medical care.    Baseline:  Goal status: INITIAL   ASSESSMENT:  CLINICAL IMPRESSION: Pt is a 78 y.o. female who presents for communication evaluation in setting on stroke. Pt in ED 1/24-1/25/25 for stroke. Pt d/c'd home with HH ST. PMHx significant of  HTN, CKD stage IIIB, DMT2 on insulin , CAD, chronic smoking, chronic respiratory failure with COPD and on home oxygen , anxiety, HLD. Assessment completed via functional/dynamic means including selected subtests of Western Aphasia Battery Revised (WAB-R) and PROM (Communication Effectiveness Scale). 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 Pt with halting, telegraphic speech, mostly single words, paraphasias, neologisms, severe wordfinding difficulty. Similar difficulty noted with confrontation naming and divergent naming. Auditory comprehension is a relatively strength for pt as pt with  intact single word recognition and ability to answer simple and complex yes/no questions. Increased difficulty noted with pt's ability to follow 2-step (moderately complex and complex) commands. Pt with impaired repetition at the word, phase, and sentence level. 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 SGD evaluation completed 06/27/23 with SGD trial recommending. See details of assessment and tx session above. Recommend course of ST targeting functional communication, further assessment of functional reading/writing, and pt/caregiver training to help promote QoL and overall life participation.   OBJECTIVE IMPAIRMENTS include expressive language, receptive language, and aphasia. These impairments are limiting patient from ADLs/IADLs and effectively communicating at home and in community. Factors affecting potential to achieve goals and functional outcome are severity of impairments. Patient will benefit from skilled SLP services to address above impairments and improve overall function.  REHAB POTENTIAL: Good  PLAN: SLP FREQUENCY: 2x/week  SLP DURATION: 12 weeks  PLANNED INTERVENTIONS: Language facilitation, Cueing hierachy, Internal/external aids, Functional tasks, Multimodal communication approach, SLP instruction and feedback, Compensatory strategies, and Patient/family education    Dia Forget, M.S., CCC-SLP Speech-Language Pathologist [facility] [place] [facility] [phone] [place])  [place] [facility] [address] [place], [place], [address] Phone: [phone]   Fax:  [phone]

## 2023-07-11 NOTE — Therapy (Signed)
 OUTPATIENT PHYSICAL THERAPY NEURO EVALUATION   Patient Name: Hannah Kirby MRN: [id number] DOB:06/11/1945, 78 y.o., female Today's Date: 07/12/2023   PCP:    Lamon Pillow, MD   REFERRING PROVIDER:    Morey Ar, NP     END OF SESSION:  PT End of Session - 07/11/23 1616     Visit Number 2    Number of Visits 24    Date for PT Re-Evaluation 09/26/23    Progress Note Due on Visit 10    PT Start Time 1615    PT Stop Time 1657    PT Time Calculation (min) 42 min    Equipment Utilized During Treatment Gait belt    Activity Tolerance Patient tolerated treatment well              Past Medical History:  Diagnosis Date   Acute hemorrhagic colitis 08/08/2019   Anemia    Anxiety    Arthritis    C. difficile colitis 08/09/2019   CAD (coronary artery disease)    a. 02/2006 PCI: BMS x 2 to RCA, cath o/w without significant coronary disease; b. nuclear stress test 07/2014: No ischemia/infarct; c. 11/2017 MV: no isch/infarct, EF 55-65%; d. 03/2020 NSTEMI/PCI: LM nl, LAD min irregs, RI 25, small, LCX nl, RCA 30p/m ISR, 99d (3.0x15 Resolute Onyx DES).   Cerebrovascular accident (CVA) (HCC) 03/18/2023   dysarthria   Chronic bronchitis (HCC)    secondary to cigarette smoking   Chronic kidney disease (CKD), stage III (moderate) (HCC)    COPD (chronic obstructive pulmonary disease) (HCC)    Diabetes mellitus    Diastolic dysfunction    a. 07/2019 Echo: EF 60-65%, no rwma, mod LVH. Nl RV size/fxn; b. 03/2020 Echo: EF 60-65%, mod LVH, gr1 DD, nl RV size/fxn, mild AS.   FHx: allergies    GERD (gastroesophageal reflux disease)    Goiter    Granulomatous disease (HCC)    Hernia    Hyperlipidemia    Hypertension    Kidney stone on left side 2013   Microalbuminuria    NSTEMI (non-ST elevated myocardial infarction) (HCC) 03/23/2020   Obesity    Panic attacks    PVC's (premature ventricular contractions)    a. 03/2018 Zio: Occas PVCs (2.5%). Triggered events assoc w/  PVC/PAC.   Retinal tear 2020   Smokers' cough (HCC)    Spinal stenosis    Stroke (HCC) 10/29/2016   mild left side weakness   Stroke (HCC) 08/01/2019   Tobacco abuse    Past Surgical History:  Procedure Laterality Date   ABDOMINAL HYSTERECTOMY     BREAST SURGERY     CATARACT EXTRACTION W/PHACO Right 03/01/2017   Procedure: CATARACT EXTRACTION PHACO AND INTRAOCULAR LENS PLACEMENT (IOC) RIGHT DIABETIC;  Surgeon: Annell Kidney, MD;  Location: [facility];  Service: Ophthalmology;  Laterality: Right;   CATARACT EXTRACTION W/PHACO Left 03/22/2017   Procedure: CATARACT EXTRACTION PHACO AND INTRAOCULAR LENS PLACEMENT (IOC) LEFT DIABETIC;  Surgeon: Annell Kidney, MD;  Location: [facility];  Service: Ophthalmology;  Laterality: Left;  Diabetic - insulin  and oral meds   COLONOSCOPY WITH PROPOFOL  N/A 09/23/2014   Procedure: COLONOSCOPY WITH PROPOFOL ;  Surgeon: Deveron Fly, MD;  Location: [facility] ENDOSCOPY;  Service: Endoscopy;  Laterality: N/A;   COLONOSCOPY WITH PROPOFOL  N/A 01/11/2018   Procedure: COLONOSCOPY WITH PROPOFOL ;  Surgeon: Deveron Fly, MD;  Location: [facility] ENDOSCOPY;  Service: Endoscopy;  Laterality: N/A;   COLONOSCOPY WITH PROPOFOL  N/A 04/24/2018   Procedure: COLONOSCOPY  WITH PROPOFOL ;  Surgeon: Deveron Fly, MD;  Location: [facility] ENDOSCOPY;  Service: Endoscopy;  Laterality: N/A;   COLONOSCOPY WITH PROPOFOL  N/A 11/19/2019   Procedure: COLONOSCOPY WITH PROPOFOL ;  Surgeon: Marnee Sink, MD;  Location: [facility] ENDOSCOPY;  Service: Endoscopy;  Laterality: N/A;   CORONARY ANGIOPLASTY WITH STENT PLACEMENT  2008   CORONARY STENT INTERVENTION N/A 03/24/2020   Procedure: CORONARY STENT INTERVENTION;  Surgeon: Sammy Crisp, MD;  Location: [facility] INVASIVE CV LAB;  Service: Cardiovascular;  Laterality: N/A;   ESOPHAGOGASTRODUODENOSCOPY (EGD) WITH PROPOFOL  N/A 12/30/2014   Procedure: ESOPHAGOGASTRODUODENOSCOPY (EGD) WITH PROPOFOL ;  Surgeon: Deveron Fly, MD;   Location: [facility] ENDOSCOPY;  Service: Endoscopy;  Laterality: N/A;   ESOPHAGOGASTRODUODENOSCOPY (EGD) WITH PROPOFOL  N/A 07/19/2016   Procedure: ESOPHAGOGASTRODUODENOSCOPY (EGD) WITH PROPOFOL ;  Surgeon: Deveron Fly, MD;  Location: [facility] ENDOSCOPY;  Service: Endoscopy;  Laterality: N/A;   ESOPHAGOGASTRODUODENOSCOPY (EGD) WITH PROPOFOL  N/A 04/24/2018   Procedure: ESOPHAGOGASTRODUODENOSCOPY (EGD) WITH PROPOFOL ;  Surgeon: Deveron Fly, MD;  Location: [facility] ENDOSCOPY;  Service: Endoscopy;  Laterality: N/A;   hysterectomy (other)     LEFT HEART CATH AND CORONARY ANGIOGRAPHY N/A 03/24/2020   Procedure: LEFT HEART CATH AND CORONARY ANGIOGRAPHY;  Surgeon: Sammy Crisp, MD;  Location: [facility] INVASIVE CV LAB;  Service: Cardiovascular;  Laterality: N/A;   Patient Active Problem List   Diagnosis Date Noted   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) 03/27/2023   Dysarthria 03/17/2023   Chronic obstructive pulmonary disease (HCC) 03/17/2023   Dizziness 02/23/2023   Dehydration 02/20/2023   Restrictive airway disease 07/21/2020   Partial thickness rotator cuff tear 03/20/2020   Shoulder pain 03/05/2020   Retinal detachment, left 03/04/2020   Ataxia 02/28/2020   Difficulty walking 02/28/2020   Physical deconditioning 02/28/2020   Chronic diastolic congestive heart failure (HCC) 11/20/2019   Polyp of ascending colon    Hypokalemia 08/08/2019   TIA (transient ischemic attack) 08/01/2019   Benign hypertensive kidney disease with chronic kidney disease 04/25/2019   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   Chronic, continuous use of opioids 03/26/2018   History of adenomatous polyp of colon 01/22/2018   Ulceration of colon determined by endoscopy 01/22/2018   Diverticulosis of colon 01/22/2018   History of CVA (cerebrovascular accident) 11/08/2016   Weakness of left  upper extremity 10/30/2016   AAA (abdominal aortic aneurysm) without rupture (HCC) 08/12/2016   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   Stage 3b chronic kidney disease (HCC) 07/27/2015   Chest pain at rest 05/06/2015   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HCC) 10/28/2014   Solitary pulmonary nodule on lung CT 08/20/2014   Allergic rhinitis 07/28/2014   CAFL (chronic airflow limitation) (HCC) 07/28/2014   Acid reflux 07/28/2014   Granuloma annulare    Back pain 11/12/2013   Lumbar scoliosis 10/30/2013   Lumbar radiculopathy 10/30/2013   Lumbar canal stenosis 10/30/2013   Calcium  blood increased 10/22/2013   Proteinuria 10/22/2013   Obesity 03/21/2011   Smokes with greater than 30 pack year history 03/21/2011   Edema 08/18/2010   Hyperlipidemia 03/25/2009   Coronary artery disease 03/25/2009   Primary hypertension 03/25/2009    ONSET DATE: 03/17/23  REFERRING DIAG:  R53.1 (ICD-10-CM) - Left-sided weakness  I63.9 (ICD-10-CM) - Cerebrovascular accident (CVA), unspecified mechanism (HCC)    THERAPY DIAG:  Abnormality of gait and mobility  Difficulty in walking, not elsewhere classified  Muscle weakness (generalized)  Other abnormalities of  gait and mobility  Unsteadiness on feet  Rationale for Evaluation and Treatment: Rehabilitation  SUBJECTIVE:                                                                                                                                                                                             SUBJECTIVE STATEMENT:  Pt presents with DTR and portable oxygen  tank, uses 2-3 L of oxygen  when using it. Pt has word finding difficulty making subjective info somewhat limited. PT provides her with time to answer to the best of her ability. Pt instructed we could use oxygen  tank provided here if needed. Pt was doing HH PT working on strength, balance, mobility generally. Pt has had no falls in last 6 months. Pt states she would like to improve her  ability to move around more. Pt wants to gain independence to perform cooking activity, gardening, etc. Pt feels limited mostly due to endurance with standing.   Pt accompanied by: self and family member  PERTINENT HISTORY: Pmx of CVA, COPD, Dysarthria, Ataxia, TIA, T2DM, Back pain, scoliosis   PAIN:  Are you having pain? Yes: NPRS scale: 3 Pain location: low back Aggravating factors: standing long time, sleeping    PRECAUTIONS: Fall  RED FLAGS: None   WEIGHT BEARING RESTRICTIONS: No  FALLS: Has patient fallen in last 6 months? No  LIVING ENVIRONMENT: Lives with: lives with their family and DTR comes by every day to assist with things  Lives in: House/apartment Stairs: Yes: External: 1 steps; none Has following equipment at home: Single point cane and Walker - 4 wheeled  PLOF: Independent with basic ADLs and Independent with household mobility with device  PATIENT GOALS: Improve mobility and function.   OBJECTIVE:  Note: Objective measures were completed at Evaluation unless otherwise noted.  DIAGNOSTIC FINDINGS: MRI 03/17/23 "1. Punctate foci of restricted diffusion in the left posterior  frontal white matter, most likely acute infarcts.  2. Numerous foci of hemosiderin deposition, which are seen in the  deep gray structures but also in the bilateral cerebral hemispheres,  with 1 new focus compared to 02/20/2023. While this could be the  sequela of chronic hypertensive microhemorrhages, this appearance is  concerning for cerebral amyloid angiopathy."  COGNITION: Overall cognitive status: difficulty with word finding, responds appropriately    POSTURE: Rounded Shoulders   LOWER EXTREMITY ROM:     WNL for tasks assessed   LOWER EXTREMITY MMT:    MMT Right Eval Left Eval  Hip flexion 3+ 4  Hip extension    Hip abduction 4 4+  Hip adduction 4+ 5  Hip internal rotation  Hip external rotation    Knee flexion 4- 4+  Knee extension 4- 4+  Ankle dorsiflexion 4  4+  Ankle plantarflexion 4+ 5  Ankle inversion    Ankle eversion    (Blank rows = not tested)   TRANSFERS: Sit to stand: Modified independence  Assistive device utilized: None     Stand to sit: Modified independence  Assistive device utilized: None     Chair to chair: Modified independence  Assistive device utilized: None       RAMP:  Not tested  CURB:  Not tested  STAIRS: Not tested GAIT: Findings: Distance walked: 33 ft and Comments: slow speed, wide BOS, short step length  FUNCTIONAL TESTS:  5 times sit to stand: 42.28 sec SpO2 response: 94-95%, heavy UE use  Timed up and go (TUG): 24 sec  2 minute walk test: test visit 2 Berg Balance Scale: 39 : .39 m/s Patient demonstrates increased fall risk as noted by score of  39 /56 on Berg Balance Scale.  (<36= high risk for falls, close to 100%; 37-45 significant >80%; 46-51 moderate >50%; 52-55 lower >25%)     PATIENT SURVEYS:  Stroke Impact Scale 16 : 45/80                                                                                                                              TREATMENT DATE: 07/12/23   PHYSICAL PERFORMANCE:  : 172 ft with RW, O2desats to 88% by end of test, 92 %at minute 1  2 min seated rest and O2 sat recovers to 97%   HEP introduction - TE - Wide tandem with counter support as needed   - 2 sets - 30 sec hold - Seated Long Arc Quad - 2 sets - 10 reps - 2 sec  hold - Seated March   - 2 sets - 20 reps - Seated Heel Toe Raises - 2 sets - 15 reps - 2 sec  hold   TA Standing step to 1/2 foam for heel strike practice x 20 reps  Gait no AD x 100 ft, cues for step length   NMR Airex 1 foot on step one foot on airex ( LLE on airex) 3 x 30 sec   PATIENT EDUCATION: Education details: POC Person educated: Patient Education method: Explanation Education comprehension: verbalized understanding   HOME EXERCISE PROGRAM: Access Code: [facility] URL: [email or web address] Date:  07/11/2023 Prepared by: Marlynn Singer  Exercises - Wide tandem with counter support as needed   - 1 x daily - 4 x weekly - 2 sets - 30 sec hold - Seated Long Arc Quad  - 1 x daily - 4 x weekly - 2 sets - 10 reps - 2 sec  hold - Seated March  - 1 x daily - 4 x weekly - 2 sets - 20 reps - Seated Heel Toe Raises  - 1 x daily - 4 x weekly - 2 sets - 15  reps - 2 sec  hold    GOALS: Goals reviewed with patient? Yes  SHORT TERM GOALS: Target date: 08/01/2023       Patient will be independent in home exercise program to improve strength/mobility for better functional independence with ADLs. Baseline: No HEP currently  Goal status: INITIAL   LONG TERM GOALS: Target date: 09/26/2023   1.  Patient (> 69 years old) will complete five times sit to stand test in < 20 seconds indicating an increased LE strength and improved balance. Baseline: 42.28 Goal status: INITIAL  2.  Patient will improve SIS 16 score to 55   to demonstrate statistically significant improvement in mobility and quality of life as it relates to their CVA.  Baseline: 45 Goal status: INITIAL   3.  Patient will increase Berg Balance score by > 6 points to demonstrate decreased fall risk during functional activities. Baseline: 39 Goal status: INITIAL   4.   Patient will reduce timed up and go to <11 seconds to reduce fall risk and demonstrate improved transfer/gait ability. Baseline: 24 sec Goal status: INITIAL  5.   Patient will increase 10 meter walk test to >.77m/s as to improve gait speed for better community ambulation and to reduce fall risk. Baseline: .38 m/s Goal status: INITIAL  6.   Patient will increase 2 minute walk test distance by 50 ft or greater for progression to community ambulator and demonstrate improved gait ability Baseline: 172 ft, O2 desats to 88% at end of test, recovers in 2 min to 97% after seated rest  Goal status: INITIAL    ASSESSMENT:  CLINICAL IMPRESSION: Patient arrived with  good motivation for completion of pt activities.  Pt demonstrates significant limitations with community mobility AEB results. Pt introduced to initial HEP, no exercises in clinic caused SPO2 drop. Pt SPO2% monitored throughout session an outside of stayed above 92% with activities. If performing heavy endurance training recommend using portable oxygen  and close SP02 monitoring. Pt will continue to benefit from skilled physical therapy intervention to address impairments, improve QOL, and attain therapy goals.    OBJECTIVE IMPAIRMENTS: Abnormal gait, cardiopulmonary status limiting activity, decreased activity tolerance, decreased balance, decreased endurance, decreased mobility, difficulty walking, and decreased strength.   ACTIVITY LIMITATIONS: carrying, lifting, standing, squatting, stairs, transfers, toileting, and locomotion level  PARTICIPATION LIMITATIONS: meal prep, cleaning, laundry, community activity, and yard work  PERSONAL FACTORS: Age and 3+ comorbidities: Pmx of CVA, COPD, Dysarthria, Ataxia, TIA, T2DM, Back pain, scoliosis  are also affecting patient's functional outcome.   REHAB POTENTIAL: Good  CLINICAL DECISION MAKING: Evolving/moderate complexity  EVALUATION COMPLEXITY: Moderate  PLAN:  PT FREQUENCY: 2x/week  PT DURATION: 12 weeks  PLANNED INTERVENTIONS: 97750- Physical Performance Testing, 97110-Therapeutic exercises, 97530- Therapeutic activity, 97112- Neuromuscular re-education, 97535- Self Care, 91478- Manual therapy, [phone]- Gait training, Patient/Family education, Balance training, Stair training, and Moist heat  PLAN FOR NEXT SESSION:  monitor SPO2 and get portable oxygen  if necessary  Endurance, balance, strength ( L>R), administer HEP, gait training  If performing heavy endurance training recommend using portable oxygen  and close SP02 monitoring.  Edwina Gram, PT 07/12/2023, 9:22 AM

## 2023-07-13 ENCOUNTER — Ambulatory Visit

## 2023-07-13 DIAGNOSIS — M6281 Muscle weakness (generalized): Secondary | ICD-10-CM | POA: Diagnosis not present

## 2023-07-13 DIAGNOSIS — R269 Unspecified abnormalities of gait and mobility: Secondary | ICD-10-CM | POA: Diagnosis not present

## 2023-07-13 DIAGNOSIS — R2689 Other abnormalities of gait and mobility: Secondary | ICD-10-CM | POA: Diagnosis not present

## 2023-07-13 DIAGNOSIS — I69890 Apraxia following other cerebrovascular disease: Secondary | ICD-10-CM

## 2023-07-13 DIAGNOSIS — R4701 Aphasia: Secondary | ICD-10-CM | POA: Diagnosis not present

## 2023-07-13 DIAGNOSIS — R262 Difficulty in walking, not elsewhere classified: Secondary | ICD-10-CM | POA: Diagnosis not present

## 2023-07-13 NOTE — Therapy (Signed)
 OUTPATIENT SPEECH LANGUAGE PATHOLOGY APHASIA TREATMENT   Patient Name: Hannah Kirby MRN: [id number] DOB:06/03/1945, 78 y.o., female Today's Date: 07/04/2023  PCP: Jeralene Mom, MD REFERRING PROVIDER: Jeralene Mom, MD   End of Session - 07/04/23 1453     Visit Number 7    Number of Visits 24    Date for SLP Re-Evaluation 08/30/23    SLP Start Time 1400    SLP Stop Time  1445    SLP Time Calculation (min) 45 min    Activity Tolerance Patient tolerated treatment well   Simultaneous filing. User may not have seen previous data.            Past Medical History:  Diagnosis Date   Acute hemorrhagic colitis 08/08/2019   Anemia    Anxiety    Arthritis    C. difficile colitis 08/09/2019   CAD (coronary artery disease)    a. 02/2006 PCI: BMS x 2 to RCA, cath o/w without significant coronary disease; b. nuclear stress test 07/2014: No ischemia/infarct; c. 11/2017 MV: no isch/infarct, EF 55-65%; d. 03/2020 NSTEMI/PCI: LM nl, LAD min irregs, RI 25, small, LCX nl, RCA 30p/m ISR, 99d (3.0x15 Resolute Onyx DES).   Cerebrovascular accident (CVA) (HCC) 03/18/2023   dysarthria   Chronic bronchitis (HCC)    secondary to cigarette smoking   Chronic kidney disease (CKD), stage III (moderate) (HCC)    COPD (chronic obstructive pulmonary disease) (HCC)    Diabetes mellitus    Diastolic dysfunction    a. 07/2019 Echo: EF 60-65%, no rwma, mod LVH. Nl RV size/fxn; b. 03/2020 Echo: EF 60-65%, mod LVH, gr1 DD, nl RV size/fxn, mild AS.   FHx: allergies    GERD (gastroesophageal reflux disease)    Goiter    Granulomatous disease (HCC)    Hernia    Hyperlipidemia    Hypertension    Kidney stone on left side 2013   Microalbuminuria    NSTEMI (non-ST elevated myocardial infarction) (HCC) 03/23/2020   Obesity    Panic attacks    PVC's (premature ventricular contractions)    a. 03/2018 Zio: Occas PVCs (2.5%). Triggered events assoc w/ PVC/PAC.   Retinal tear 2020   Smokers' cough (HCC)     Spinal stenosis    Stroke (HCC) 10/29/2016   mild left side weakness   Stroke (HCC) 08/01/2019   Tobacco abuse    Past Surgical History:  Procedure Laterality Date   ABDOMINAL HYSTERECTOMY     BREAST SURGERY     CATARACT EXTRACTION W/PHACO Right 03/01/2017   Procedure: CATARACT EXTRACTION PHACO AND INTRAOCULAR LENS PLACEMENT (IOC) RIGHT DIABETIC;  Surgeon: Annell Kidney, MD;  Location: [facility];  Service: Ophthalmology;  Laterality: Right;   CATARACT EXTRACTION W/PHACO Left 03/22/2017   Procedure: CATARACT EXTRACTION PHACO AND INTRAOCULAR LENS PLACEMENT (IOC) LEFT DIABETIC;  Surgeon: Annell Kidney, MD;  Location: [facility];  Service: Ophthalmology;  Laterality: Left;  Diabetic - insulin  and oral meds   COLONOSCOPY WITH PROPOFOL  N/A 09/23/2014   Procedure: COLONOSCOPY WITH PROPOFOL ;  Surgeon: Deveron Fly, MD;  Location: [facility] ENDOSCOPY;  Service: Endoscopy;  Laterality: N/A;   COLONOSCOPY WITH PROPOFOL  N/A 01/11/2018   Procedure: COLONOSCOPY WITH PROPOFOL ;  Surgeon: Deveron Fly, MD;  Location: [facility] ENDOSCOPY;  Service: Endoscopy;  Laterality: N/A;   COLONOSCOPY WITH PROPOFOL  N/A 04/24/2018   Procedure: COLONOSCOPY WITH PROPOFOL ;  Surgeon: Deveron Fly, MD;  Location: [facility] ENDOSCOPY;  Service: Endoscopy;  Laterality: N/A;   COLONOSCOPY WITH PROPOFOL  N/A  11/19/2019   Procedure: COLONOSCOPY WITH PROPOFOL ;  Surgeon: Marnee Sink, MD;  Location: [facility] ENDOSCOPY;  Service: Endoscopy;  Laterality: N/A;   CORONARY ANGIOPLASTY WITH STENT PLACEMENT  2008   CORONARY STENT INTERVENTION N/A 03/24/2020   Procedure: CORONARY STENT INTERVENTION;  Surgeon: Sammy Crisp, MD;  Location: [facility] INVASIVE CV LAB;  Service: Cardiovascular;  Laterality: N/A;   ESOPHAGOGASTRODUODENOSCOPY (EGD) WITH PROPOFOL  N/A 12/30/2014   Procedure: ESOPHAGOGASTRODUODENOSCOPY (EGD) WITH PROPOFOL ;  Surgeon: Deveron Fly, MD;  Location: [facility] END[facility];  Service: Endoscopy;  Laterality:  N/A;   ESOPHAGOGASTRODUODENOSCOPY (EGD) WITH PROPOFOL  N/A 07/19/2016   Procedure: ESOPHAGOGASTRODUODENOSCOPY (EGD) WITH PROPOFOL ;  Surgeon: Deveron Fly, MD;  Location: [facility] ENDOSCOPY;  Service: Endoscopy;  Laterality: N/A;   ESOPHAGOGASTRODUODENOSCOPY (EGD) WITH PROPOFOL  N/A 04/24/2018   Procedure: ESOPHAGOGASTRODUODENOSCOPY (EGD) WITH PROPOFOL ;  Surgeon: Deveron Fly, MD;  Location: [facility] ENDOSCOPY;  Service: Endoscopy;  Laterality: N/A;   hysterectomy (other)     LEFT HEART CATH AND CORONARY ANGIOGRAPHY N/A 03/24/2020   Procedure: LEFT HEART CATH AND CORONARY ANGIOGRAPHY;  Surgeon: Sammy Crisp, MD;  Location: [facility] INVASIVE CV LAB;  Service: Cardiovascular;  Laterality: N/A;   Patient Active Problem List   Diagnosis Date Noted   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) 03/27/2023   Dysarthria 03/17/2023   Chronic obstructive pulmonary disease (HCC) 03/17/2023   Dizziness 02/23/2023   Dehydration 02/20/2023   Restrictive airway disease 07/21/2020   Partial thickness rotator cuff tear 03/20/2020   Shoulder pain 03/05/2020   Retinal detachment, left 03/04/2020   Ataxia 02/28/2020   Difficulty walking 02/28/2020   Physical deconditioning 02/28/2020   Chronic diastolic congestive heart failure (HCC) 11/20/2019   Polyp of ascending colon    Hypokalemia 08/08/2019   TIA (transient ischemic attack) 08/01/2019   Benign hypertensive kidney disease with chronic kidney disease 04/25/2019   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   Chronic, continuous use of opioids 03/26/2018   History of adenomatous polyp of colon 01/22/2018   Ulceration of colon determined by endoscopy 01/22/2018   Diverticulosis of colon 01/22/2018   History of CVA (cerebrovascular accident) 11/08/2016   Weakness of left upper extremity 10/30/2016   AAA (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) 08/12/2016   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   Stage 3b chronic kidney disease (HCC) 07/27/2015   Chest pain at rest 05/06/2015   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HCC) 10/28/2014   Solitary pulmonary nodule on lung CT 08/20/2014   Allergic rhinitis 07/28/2014   CAFL (chronic airflow limitation) (HCC) 07/28/2014   Acid reflux 07/28/2014   Granuloma annulare    Back pain 11/12/2013   Lumbar scoliosis 10/30/2013   Lumbar radiculopathy 10/30/2013   Lumbar canal stenosis 10/30/2013   Calcium  blood increased 10/22/2013   Proteinuria 10/22/2013   Obesity 03/21/2011   Smokes with greater than 30 pack year history 03/21/2011   Edema 08/18/2010   Hyperlipidemia 03/25/2009   Coronary artery disease 03/25/2009   Primary hypertension 03/25/2009    ONSET DATE: 03/17/23 (admitted with stroke), 05/23/23 (referral date)  REFERRING DIAG:  R41.841 (ICD-10-CM) - Cognitive communication deficit  I63.9 (ICD-10-CM) - CVA (cerebral vascular accident) (HCC)  R47.02 (ICD-10-CM) - Dysphasia    THERAPY DIAG:  Aphasia  Apraxia following other cerebrovascular disease  Rationale for Evaluation and Treatment Rehabilitation  SUBJECTIVE:   SUBJECTIVE STATEMENT: Pt alert, pleasant, and cooperative. On home O2.  Pt accompanied by: self,  family member  PERTINENT HISTORY:  Pt is a 78 y.o. female who presents for communication evaluation in setting on stroke. Pt in ED 1/24-1/25/25. Pt d/c'd home with HH ST. PMHx significant of   HTN, CKD stage IIIB, DMT2 on insulin , CAD, chronic smoking, chronic respiratory failure with COPD and on home oxygen , anxiety, HLD. MRI 03/17/23 "1. Punctate foci of restricted diffusion in the left posterior  frontal white matter, most likely acute infarcts.  2. Numerous foci of hemosiderin deposition, which are seen in the  deep gray structures but also in the bilateral cerebral hemispheres,  with 1 new focus compared to 02/20/2023. While  this could be the  sequela of chronic hypertensive microhemorrhages, this appearance is  concerning for cerebral amyloid angiopathy."   PAIN:  Are you having pain? No  FALLS: defer to PT; under care of HH PT  LIVING ENVIRONMENT: Lives with: lives with their spouse Lives in: House/apartment  PLOF:  Level of assistance: Independent with ADLs Employment: Retired   PATIENT GOALS    for communication to improve   OBJECTIVE:  TODAY'S TREATMENT:  Pt and daughter educated re: rationale for SGD, benefits of SGD, and research supporting SGD use in persons wit aphasia. Pt and daughter agreeable to pursuing SGD trial. Received and review insurance benefits check with pt and daughter. Will pursue trial. Initiated device personalization guide with pt and daughter. Pt filled out name and DOB on paperwork indep. Pt and daughter provided input into goals, specific phrases/targets.  Pt repeated 5-6 word phrases with >60% accuracy indep with mod/max cueing. Pt benefits from repetition, phonemic cueing, and written cueing.     PATIENT EDUCATION: Education details: as above Person educated: Patient and Child(ren) Education method: Explanation Education comprehension: verbalized understanding and needs further education  HOME EXERCISE PROGRAM:   Talk[facility] Therapy - on computer - if able to access  Worksheets flagged in workbooks from home  Practice SFA, supported communication   GOALS:  Goals reviewed with patient? Yes  SHORT TERM GOALS: Target date: 10 sessions  Pt will participate in further assessment of functional reading/writing. Baseline: Goal status: INITIAL  2.  With Moderate A, patient will complete a semantic feature analysis with at least 2 relevant features for 8/10 target words to improve word-finding skills.  Baseline:  Goal status: INITIAL  3.  With Maximal A, patient will generate sentences with 3 or more words in response to a situation at 80% accuracy in order to  increase ability to communicate basic wants and needs.  Baseline:  Goal status: INITIAL  4.   With Maximal A,  pt will follow 2-step commands for improved participation in ADLs/iADLs with max cueing. Baseline:  Goal status: INITIAL  5.  Pt will repeat short sentences (<4 words) with good approximations 80% of the time with max cueing. Baseline:  Goal status: INITIAL    LONG TERM GOALS: Target date: 12 weeks  Pt will report a subjective improvement in communication per PROM.  Baseline: CES 15/32 on 06/07/23 Goal status: INITIAL  2.  With Min A, patient/family will demonstrate understanding of the following concepts: aphasia, spontaneous recovery, communication vs conversation, strengths/strategies to promote success, local resources in order to increase patient's participation in medical care.    Baseline:  Goal status: INITIAL   ASSESSMENT:  CLINICAL IMPRESSION: Pt is a 78 y.o. female who presents for communication evaluation in setting on stroke. Pt in ED 1/24-1/25/25 for stroke. Pt d/c'd home with HH ST. PMHx significant of  HTN, CKD  stage IIIB, DMT2 on insulin , CAD, chronic smoking, chronic respiratory failure with COPD and on home oxygen , anxiety, HLD. Assessment completed via functional/dynamic means including selected subtests of Western Aphasia Battery Revised (WAB-R) and PROM (Communication Effectiveness Scale). 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 Pt with halting, telegraphic speech, mostly single words, paraphasias, neologisms, severe wordfinding difficulty. Similar difficulty noted with confrontation naming and divergent naming. Auditory comprehension is a relatively strength for pt as pt with intact single word recognition and ability to answer simple and complex yes/no questions. Increased difficulty noted with pt's ability to follow 2-step (moderately complex and complex) commands. Pt with impaired repetition at the word, phase, and  sentence level. 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 SGD evaluation completed 06/27/23 with SGD trial recommending. See details of assessment and tx session above. Recommend course of ST targeting functional communication, further assessment of functional reading/writing, and pt/caregiver training to help promote QoL and overall life participation.   OBJECTIVE IMPAIRMENTS include expressive language, receptive language, and aphasia. These impairments are limiting patient from ADLs/IADLs and effectively communicating at home and in community. Factors affecting potential to achieve goals and functional outcome are severity of impairments. Patient will benefit from skilled SLP services to address above impairments and improve overall function.  REHAB POTENTIAL: Good  PLAN: SLP FREQUENCY: 2x/week  SLP DURATION: 12 weeks  PLANNED INTERVENTIONS: Language facilitation, Cueing hierachy, Internal/external aids, Functional tasks, Multimodal communication approach, SLP instruction and feedback, Compensatory strategies, and Patient/family education    Dia Forget, M.S., CCC-SLP Speech-Language Pathologist [facility] [phone] [place])  [facility] at [facility] [address] [place], [place], [address] Phone: [phone]   Fax:  [phone]

## 2023-07-18 ENCOUNTER — Ambulatory Visit

## 2023-07-18 ENCOUNTER — Ambulatory Visit: Admitting: Physical Therapy

## 2023-07-18 DIAGNOSIS — R2689 Other abnormalities of gait and mobility: Secondary | ICD-10-CM

## 2023-07-18 DIAGNOSIS — R262 Difficulty in walking, not elsewhere classified: Secondary | ICD-10-CM

## 2023-07-18 DIAGNOSIS — R269 Unspecified abnormalities of gait and mobility: Secondary | ICD-10-CM | POA: Diagnosis not present

## 2023-07-18 DIAGNOSIS — M6281 Muscle weakness (generalized): Secondary | ICD-10-CM

## 2023-07-18 DIAGNOSIS — I69890 Apraxia following other cerebrovascular disease: Secondary | ICD-10-CM | POA: Diagnosis not present

## 2023-07-18 DIAGNOSIS — R4701 Aphasia: Secondary | ICD-10-CM | POA: Diagnosis not present

## 2023-07-18 DIAGNOSIS — R2681 Unsteadiness on feet: Secondary | ICD-10-CM

## 2023-07-18 NOTE — Therapy (Signed)
 OUTPATIENT SPEECH LANGUAGE PATHOLOGY APHASIA TREATMENT   Patient Name: Hannah Kirby MRN: [id number] DOB:1945/03/03, 78 y.o., female Today's Date: 07/04/2023  PCP: Jeralene Mom, MD REFERRING PROVIDER: Jeralene Mom, MD  Speech Therapy Progress Note  Dates of Reporting Period: 06/07/23 to 07/18/23.  Objective: Patient has been seen for 10 speech therapy sessions this reporting period targeting functional communication. Patient is making progress toward LTGs and met all STGs this reporting period. See skilled intervention, clinical impressions, and goals below for details.    End of Session - 07/04/23 1453     Visit Number 7    Number of Visits 24    Date for SLP Re-Evaluation 08/30/23    SLP Start Time 1400    SLP Stop Time  1445    SLP Time Calculation (min) 45 min    Activity Tolerance Patient tolerated treatment well   Simultaneous filing. User may not have seen previous data.            Past Medical History:  Diagnosis Date   Acute hemorrhagic colitis 08/08/2019   Anemia    Anxiety    Arthritis    C. difficile colitis 08/09/2019   CAD (coronary artery disease)    a. 02/2006 PCI: BMS x 2 to RCA, cath o/w without significant coronary disease; b. nuclear stress test 07/2014: No ischemia/infarct; c. 11/2017 MV: no isch/infarct, EF 55-65%; d. 03/2020 NSTEMI/PCI: LM nl, LAD min irregs, RI 25, small, LCX nl, RCA 30p/m ISR, 99d (3.0x15 Resolute Onyx DES).   Cerebrovascular accident (CVA) (HCC) 03/18/2023   dysarthria   Chronic bronchitis (HCC)    secondary to cigarette smoking   Chronic kidney disease (CKD), stage III (moderate) (HCC)    COPD (chronic obstructive pulmonary disease) (HCC)    Diabetes mellitus    Diastolic dysfunction    a. 07/2019 Echo: EF 60-65%, no rwma, mod LVH. Nl RV size/fxn; b. 03/2020 Echo: EF 60-65%, mod LVH, gr1 DD, nl RV size/fxn, mild AS.   FHx: allergies    GERD (gastroesophageal reflux disease)    Goiter    Granulomatous disease (HCC)     Hernia    Hyperlipidemia    Hypertension    Kidney stone on left side 2013   Microalbuminuria    NSTEMI (non-ST elevated myocardial infarction) (HCC) 03/23/2020   Obesity    Panic attacks    PVC's (premature ventricular contractions)    a. 03/2018 Zio: Occas PVCs (2.5%). Triggered events assoc w/ PVC/PAC.   Retinal tear 2020   Smokers' cough (HCC)    Spinal stenosis    Stroke (HCC) 10/29/2016   mild left side weakness   Stroke (HCC) 08/01/2019   Tobacco abuse    Past Surgical History:  Procedure Laterality Date   ABDOMINAL HYSTERECTOMY     BREAST SURGERY     CATARACT EXTRACTION W/PHACO Right 03/01/2017   Procedure: CATARACT EXTRACTION PHACO AND INTRAOCULAR LENS PLACEMENT (IOC) RIGHT DIABETIC;  Surgeon: Annell Kidney, MD;  Location: [facility];  Service: Ophthalmology;  Laterality: Right;   CATARACT EXTRACTION W/PHACO Left 03/22/2017   Procedure: CATARACT EXTRACTION PHACO AND INTRAOCULAR LENS PLACEMENT (IOC) LEFT DIABETIC;  Surgeon: Annell Kidney, MD;  Location: [facility];  Service: Ophthalmology;  Laterality: Left;  Diabetic - insulin  and oral meds   COLONOSCOPY WITH PROPOFOL  N/A 09/23/2014   Procedure: COLONOSCOPY WITH PROPOFOL ;  Surgeon: Deveron Fly, MD;  Location: [facility] ENDOSCOPY;  Service: Endoscopy;  Laterality: N/A;   COLONOSCOPY WITH PROPOFOL  N/A 01/11/2018   Procedure:  COLONOSCOPY WITH PROPOFOL ;  Surgeon: Deveron Fly, MD;  Location: [facility] ENDOSCOPY;  Service: Endoscopy;  Laterality: N/A;   COLONOSCOPY WITH PROPOFOL  N/A 04/24/2018   Procedure: COLONOSCOPY WITH PROPOFOL ;  Surgeon: Deveron Fly, MD;  Location: [facility] ENDOSCOPY;  Service: Endoscopy;  Laterality: N/A;   COLONOSCOPY WITH PROPOFOL  N/A 11/19/2019   Procedure: COLONOSCOPY WITH PROPOFOL ;  Surgeon: Marnee Sink, MD;  Location: [facility] ENDOSCOPY;  Service: Endoscopy;  Laterality: N/A;   CORONARY ANGIOPLASTY WITH STENT PLACEMENT  2008   CORONARY STENT INTERVENTION N/A 03/24/2020    Procedure: CORONARY STENT INTERVENTION;  Surgeon: Sammy Crisp, MD;  Location: [facility] INVASIVE CV LAB;  Service: Cardiovascular;  Laterality: N/A;   ESOPHAGOGASTRODUODENOSCOPY (EGD) WITH PROPOFOL  N/A 12/30/2014   Procedure: ESOPHAGOGASTRODUODENOSCOPY (EGD) WITH PROPOFOL ;  Surgeon: Deveron Fly, MD;  Location: [facility] ENDOSCOPY;  Service: Endoscopy;  Laterality: N/A;   ESOPHAGOGASTRODUODENOSCOPY (EGD) WITH PROPOFOL  N/A 07/19/2016   Procedure: ESOPHAGOGASTRODUODENOSCOPY (EGD) WITH PROPOFOL ;  Surgeon: Deveron Fly, MD;  Location: [facility] ENDOSCOPY;  Service: Endoscopy;  Laterality: N/A;   ESOPHAGOGASTRODUODENOSCOPY (EGD) WITH PROPOFOL  N/A 04/24/2018   Procedure: ESOPHAGOGASTRODUODENOSCOPY (EGD) WITH PROPOFOL ;  Surgeon: Deveron Fly, MD;  Location: [facility] ENDOSCOPY;  Service: Endoscopy;  Laterality: N/A;   hysterectomy (other)     LEFT HEART CATH AND CORONARY ANGIOGRAPHY N/A 03/24/2020   Procedure: LEFT HEART CATH AND CORONARY ANGIOGRAPHY;  Surgeon: Sammy Crisp, MD;  Location: [facility] INVASIVE CV LAB;  Service: Cardiovascular;  Laterality: N/A;   Patient Active Problem List   Diagnosis Date Noted   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) 03/27/2023   Dysarthria 03/17/2023   Chronic obstructive pulmonary disease (HCC) 03/17/2023   Dizziness 02/23/2023   Dehydration 02/20/2023   Restrictive airway disease 07/21/2020   Partial thickness rotator cuff tear 03/20/2020   Shoulder pain 03/05/2020   Retinal detachment, left 03/04/2020   Ataxia 02/28/2020   Difficulty walking 02/28/2020   Physical deconditioning 02/28/2020   Chronic diastolic congestive heart failure (HCC) 11/20/2019   Polyp of ascending colon    Hypokalemia 08/08/2019   TIA (transient ischemic attack) 08/01/2019   Benign hypertensive kidney disease with chronic kidney disease 04/25/2019   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   Secondary  hyperparathyroidism of renal origin (HCC) 10/24/2018   Chronic, continuous use of opioids 03/26/2018   History of adenomatous polyp of colon 01/22/2018   Ulceration of colon determined by endoscopy 01/22/2018   Diverticulosis of colon 01/22/2018   History of CVA (cerebrovascular accident) 11/08/2016   Weakness of left upper extremity 10/30/2016   AAA (abdominal aortic aneurysm) without rupture (HCC) 08/12/2016   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   Stage 3b chronic kidney disease (HCC) 07/27/2015   Chest pain at rest 05/06/2015   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HCC) 10/28/2014   Solitary pulmonary nodule on lung CT 08/20/2014   Allergic rhinitis 07/28/2014   CAFL (chronic airflow limitation) (HCC) 07/28/2014   Acid reflux 07/28/2014   Granuloma annulare    Back pain 11/12/2013   Lumbar scoliosis 10/30/2013   Lumbar radiculopathy 10/30/2013   Lumbar canal stenosis 10/30/2013   Calcium  blood increased 10/22/2013   Proteinuria 10/22/2013   Obesity 03/21/2011   Smokes with greater than 30 pack year history 03/21/2011   Edema 08/18/2010   Hyperlipidemia 03/25/2009   Coronary artery disease 03/25/2009   Primary hypertension 03/25/2009    ONSET DATE: 03/17/23 (admitted with stroke), 05/23/23 (referral date)  REFERRING DIAG:  R41.841 (ICD-10-CM) - Cognitive communication  deficit  I63.9 (ICD-10-CM) - CVA (cerebral vascular accident) (HCC)  R47.02 (ICD-10-CM) - Dysphasia    THERAPY DIAG:  Aphasia  Apraxia following other cerebrovascular disease  Rationale for Evaluation and Treatment Rehabilitation  SUBJECTIVE:   SUBJECTIVE STATEMENT: Pt alert, pleasant, and cooperative. On home O2.  Pt accompanied by: self, family member  PERTINENT HISTORY:  Pt is a 78 y.o. female who presents for communication evaluation in setting on stroke. Pt in ED 1/24-1/25/25. Pt d/c'd home with HH ST. PMHx significant of   HTN, CKD stage IIIB, DMT2 on insulin , CAD, chronic smoking, chronic  respiratory failure with COPD and on home oxygen , anxiety, HLD. MRI 03/17/23 "1. Punctate foci of restricted diffusion in the left posterior  frontal white matter, most likely acute infarcts.  2. Numerous foci of hemosiderin deposition, which are seen in the  deep gray structures but also in the bilateral cerebral hemispheres,  with 1 new focus compared to 02/20/2023. While this could be the  sequela of chronic hypertensive microhemorrhages, this appearance is  concerning for cerebral amyloid angiopathy."   PAIN:  Are you having pain? No  FALLS: defer to PT; under care of HH PT  LIVING ENVIRONMENT: Lives with: lives with their spouse Lives in: House/apartment  PLOF:  Level of assistance: Independent with ADLs Employment: Retired   PATIENT GOALS    for communication to improve   OBJECTIVE:  TODAY'S TREATMENT:  Engineer, water: Pt generated x15 3-4 word responses during structured tasks, improving to x20 with max verbal cueing.   Repetition: Pt repeated 5-6 word phrases with 65 accuracy indep improving to 80% accuracy mod/max cueing. Pt benefits from repetition, phonemic cueing, and written cueing.   Supportive counseling: Pt and daughter note improvement in pt's able to respond appropriately with self-correction/revision. Pt continues to be frustrated re: CLOF for communication. Supportive counseling with emphasis on progress to date provided as appropriate.    PATIENT EDUCATION: Education details: as above Person educated: Patient and Child(ren) Education method: Explanation Education comprehension: verbalized understanding and needs further education  HOME EXERCISE PROGRAM:   Talk[employer] Therapy - on computer - if able to access  Worksheets flagged in workbooks from home  Practice SFA, supported communication   GOALS:  Goals reviewed with patient? Yes  SHORT TERM GOALS: Target date: 10 sessions  Pt will participate in further assessment of functional  reading/writing. Baseline: Goal status: MET  2.  With Moderate A, patient will complete a semantic feature analysis with at least 2 relevant features for 8/10 target words to improve word-finding skills.  Baseline:  Goal status: MET  3.  With Maximal A, patient will generate sentences with 3 or more words in response to a situation at 80% accuracy in order to increase ability to communicate basic wants and needs.  Baseline:  Goal status: MET  4.   With Maximal A,  pt will follow 2-step commands for improved participation in ADLs/iADLs with max cueing. Baseline:  Goal status:MET  5.  Pt will repeat short sentences (<4 words) with good approximations 80% of the time with max cueing. Baseline:  Goal status: MET    LONG TERM GOALS: Target date: 12 weeks  Pt will report a subjective improvement in communication per PROM.  Baseline: CES 15/32 on 06/07/23 Goal status: PROGRESSING  2.  With Min A, patient/family will demonstrate understanding of the following concepts: aphasia, spontaneous recovery, communication vs conversation, strengths/strategies to promote success, local resources in order to increase patient's participation in medical care.  Baseline:  Goal status: PROGRESSING   ASSESSMENT:  CLINICAL IMPRESSION: Pt is a 78 y.o. female who presents for communication evaluation in setting on stroke. Pt in ED 1/24-1/25/25 for stroke. Pt d/c'd home with HH ST. PMHx significant of  HTN, CKD stage IIIB, DMT2 on insulin , CAD, chronic smoking, chronic respiratory failure with COPD and on home oxygen , anxiety, HLD. Assessment completed via functional/dynamic means including selected subtests of Western Aphasia Battery Revised (WAB-R) and PROM (Communication Effectiveness Scale). 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 Pt with halting, telegraphic speech, mostly single words, paraphasias, neologisms, severe wordfinding difficulty. Similar difficulty noted  with confrontation naming and divergent naming. Auditory comprehension is a relatively strength for pt as pt with intact single word recognition and ability to answer simple and complex yes/no questions. Increased difficulty noted with pt's ability to follow 2-step (moderately complex and complex) commands. Pt with impaired repetition at the word, phase, and sentence level. 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 SGD evaluation completed 06/27/23 with SGD trial recommending. See details of assessment and tx session above. Recommend course of ST targeting functional communication, further assessment of functional reading/writing, and pt/caregiver training to help promote QoL and overall life participation.   OBJECTIVE IMPAIRMENTS include expressive language, receptive language, and aphasia. These impairments are limiting patient from ADLs/IADLs and effectively communicating at home and in community. Factors affecting potential to achieve goals and functional outcome are severity of impairments. Patient will benefit from skilled SLP services to address above impairments and improve overall function.  REHAB POTENTIAL: Good  PLAN: SLP FREQUENCY: 2x/week  SLP DURATION: 12 weeks  PLANNED INTERVENTIONS: Language facilitation, Cueing hierachy, Internal/external aids, Functional tasks, Multimodal communication approach, SLP instruction and feedback, Compensatory strategies, and Patient/family education    Dia Forget, M.S., CCC-SLP Speech-Language Pathologist [place] [facility] [phone] [place])  [facility] [address] [place], [place], [address] Phone: [phone]   Fax:  [phone]

## 2023-07-18 NOTE — Therapy (Signed)
 OUTPATIENT PHYSICAL THERAPY NEURO TREATMENT   Patient Name: Hannah Kirby MRN: [id number] DOB:[birth year], 78 y.o., female Today's Date: 07/18/2023   PCP:   Lamon Pillow, MD   REFERRING PROVIDER:    Morey Ar, NP     END OF SESSION:  PT End of Session - 07/18/23 1446     Visit Number 3    Number of Visits 24    Date for PT Re-Evaluation 09/26/23    Progress Note Due on Visit 10    PT Start Time 1445    PT Stop Time 1525    PT Time Calculation (min) 40 min    Equipment Utilized During Treatment Gait belt    Activity Tolerance Patient tolerated treatment well              Past Medical History:  Diagnosis Date   Acute hemorrhagic colitis 08/08/2019   Anemia    Anxiety    Arthritis    C. difficile colitis 08/09/2019   CAD (coronary artery disease)    a. 02/2006 PCI: BMS x 2 to RCA, cath o/w without significant coronary disease; b. nuclear stress test 07/2014: No ischemia/infarct; c. 11/2017 MV: no isch/infarct, EF 55-65%; d. 03/2020 NSTEMI/PCI: LM nl, LAD min irregs, RI 25, small, LCX nl, RCA 30p/m ISR, 99d (3.0x15 Resolute Onyx DES).   Cerebrovascular accident (CVA) (HCC) 03/18/2023   dysarthria   Chronic bronchitis (HCC)    secondary to cigarette smoking   Chronic kidney disease (CKD), stage III (moderate) (HCC)    COPD (chronic obstructive pulmonary disease) (HCC)    Diabetes mellitus    Diastolic dysfunction    a. 07/2019 Echo: EF 60-65%, no rwma, mod LVH. Nl RV size/fxn; b. 03/2020 Echo: EF 60-65%, mod LVH, gr1 DD, nl RV size/fxn, mild AS.   FHx: allergies    GERD (gastroesophageal reflux disease)    Goiter    Granulomatous disease (HCC)    Hernia    Hyperlipidemia    Hypertension    Kidney stone on left side 2013   Microalbuminuria    NSTEMI (non-ST elevated myocardial infarction) (HCC) 03/23/2020   Obesity    Panic attacks    PVC's (premature ventricular contractions)    a. 03/2018 Zio: Occas PVCs (2.5%). Triggered events assoc w/  PVC/PAC.   Retinal tear 2020   Smokers' cough (HCC)    Spinal stenosis    Stroke (HCC) 10/29/2016   mild left side weakness   Stroke (HCC) 08/01/2019   Tobacco abuse    Past Surgical History:  Procedure Laterality Date   ABDOMINAL HYSTERECTOMY     BREAST SURGERY     CATARACT EXTRACTION W/PHACO Right 03/01/2017   Procedure: CATARACT EXTRACTION PHACO AND INTRAOCULAR LENS PLACEMENT (IOC) RIGHT DIABETIC;  Surgeon: Annell Kidney, MD;  Location: [facility];  Service: Ophthalmology;  Laterality: Right;   CATARACT EXTRACTION W/PHACO Left 03/22/2017   Procedure: CATARACT EXTRACTION PHACO AND INTRAOCULAR LENS PLACEMENT (IOC) LEFT DIABETIC;  Surgeon: Annell Kidney, MD;  Location: [facility];  Service: Ophthalmology;  Laterality: Left;  Diabetic - insulin  and oral meds   COLONOSCOPY WITH PROPOFOL  N/A 09/23/2014   Procedure: COLONOSCOPY WITH PROPOFOL ;  Surgeon: Deveron Fly, MD;  Location: [facility];  Service: Endoscopy;  Laterality: N/A;   COLONOSCOPY WITH PROPOFOL  N/A 01/11/2018   Procedure: COLONOSCOPY WITH PROPOFOL ;  Surgeon: Deveron Fly, MD;  Location: [facility] END[facility]COPY;  Service: Endoscopy;  Laterality: N/A;   COLONOSCOPY WITH PROPOFOL  N/A 04/24/2018   Procedure: COLONOSCOPY WITH  PROPOFOL ;  Surgeon: Deveron Fly, MD;  Location: [facility];  Service: Endoscopy;  Laterality: N/A;   COLONOSCOPY WITH PROPOFOL  N/A 11/19/2019   Procedure: COLONOSCOPY WITH PROPOFOL ;  Surgeon: Marnee Sink, MD;  Location: [facility];  Service: Endoscopy;  Laterality: N/A;   CORONARY ANGIOPLASTY WITH STENT PLACEMENT  2008   CORONARY STENT INTERVENTION N/A 03/24/2020   Procedure: CORONARY STENT INTERVENTION;  Surgeon: Sammy Crisp, MD;  Location: [facility];  Service: Cardiovascular;  Laterality: N/A;   ESOPHAGOGASTRODUODENOSCOPY (EGD) WITH PROPOFOL  N/A 12/30/2014   Procedure: ESOPHAGOGASTRODUODENOSCOPY (EGD) WITH PROPOFOL ;  Surgeon: Deveron Fly, MD;   Location: [facility] END[facility]COPY;  Service: Endoscopy;  Laterality: N/A;   ESOPHAGOGASTRODUODENOSCOPY (EGD) WITH PROPOFOL  N/A 07/19/2016   Procedure: ESOPHAGOGASTRODUODENOSCOPY (EGD) WITH PROPOFOL ;  Surgeon: Deveron Fly, MD;  Location: [facility] END[facility]COPY;  Service: Endoscopy;  Laterality: N/A;   ESOPHAGOGASTRODUODENOSCOPY (EGD) WITH PROPOFOL  N/A 04/24/2018   Procedure: ESOPHAGOGASTRODUODENOSCOPY (EGD) WITH PROPOFOL ;  Surgeon: Deveron Fly, MD;  Location: [facility] END[facility]COPY;  Service: Endoscopy;  Laterality: N/A;   hysterectomy (other)     LEFT HEART CATH AND CORONARY ANGIOGRAPHY N/A 03/24/2020   Procedure: LEFT HEART CATH AND CORONARY ANGIOGRAPHY;  Surgeon: Sammy Crisp, MD;  Location: [facility];  Service: Cardiovascular;  Laterality: N/A;   Patient Active Problem List   Diagnosis Date Noted   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) 03/27/2023   Dysarthria 03/17/2023   Chronic obstructive pulmonary disease (HCC) 03/17/2023   Dizziness 02/23/2023   Dehydration 02/20/2023   Restrictive airway disease 07/21/2020   Partial thickness rotator cuff tear 03/20/2020   Shoulder pain 03/05/2020   Retinal detachment, left 03/04/2020   Ataxia 02/28/2020   Difficulty walking 02/28/2020   Physical deconditioning 02/28/2020   Chronic diastolic congestive heart failure (HCC) 11/20/2019   Polyp of ascending colon    Hypokalemia 08/08/2019   TIA (transient ischemic attack) 08/01/2019   Benign hypertensive kidney disease with chronic kidney disease 04/25/2019   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   Chronic, continuous use of opioids 03/26/2018   History of adenomatous polyp of colon 01/22/2018   Ulceration of colon determined by endoscopy 01/22/2018   Diverticulosis of colon 01/22/2018   History of CVA (cerebrovascular accident) 11/08/2016   Weakness of left  upper extremity 10/30/2016   AAA (abdominal aortic aneurysm) without rupture (HCC) 08/12/2016   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   Stage 3b chronic kidney disease (HCC) 07/27/2015   Chest pain at rest 05/06/2015   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HCC) 10/28/2014   Solitary pulmonary nodule on lung CT 08/20/2014   Allergic rhinitis 07/28/2014   CAFL (chronic airflow limitation) (HCC) 07/28/2014   Acid reflux 07/28/2014   Granuloma annulare    Back pain 11/12/2013   Lumbar scoliosis 10/30/2013   Lumbar radiculopathy 10/30/2013   Lumbar canal stenosis 10/30/2013   Calcium  blood increased 10/22/2013   Proteinuria 10/22/2013   Obesity 03/21/2011   Smokes with greater than 30 pack year history 03/21/2011   Edema 08/18/2010   Hyperlipidemia 03/25/2009   Coronary artery disease 03/25/2009   Primary hypertension 03/25/2009    ONSET DATE: 03/17/23  REFERRING DIAG:  R53.1 (ICD-10-CM) - Left-sided weakness  I63.9 (ICD-10-CM) - Cerebrovascular accident (CVA), unspecified mechanism (HCC)    THERAPY DIAG:  Abnormality of gait and mobility  Difficulty in walking, not elsewhere classified  Muscle weakness (generalized)  Other abnormalities of gait  and mobility  Unsteadiness on feet  Rationale for Evaluation and Treatment: Rehabilitation  SUBJECTIVE:                                                                                                                                                                                             SUBJECTIVE STATEMENT:  Pt reports not being able to complete HEP since last visit. Encouraged to practice this prior to next visit.   FROM EVAL: Pt presents with DTR and portable oxygen  tank, uses 2-3 L of oxygen  when using it. Pt has word finding difficulty making subjective info somewhat limited. PT provides her with time to answer to the best of her ability. Pt instructed we could use oxygen  tank provided here if needed. Pt was doing HH PT  working on strength, balance, mobility generally. Pt has had no falls in last 6 months. Pt states she would like to improve her ability to move around more. Pt wants to gain independence to perform cooking activity, gardening, etc. Pt feels limited mostly due to endurance with standing.   Pt accompanied by: self and family member  PERTINENT HISTORY: Pmx of CVA, COPD, Dysarthria, Ataxia, TIA, T2DM, Back pain, scoliosis   PAIN:  Are you having pain? Yes: NPRS scale: 3 Pain location: low back Aggravating factors: standing long time, sleeping    PRECAUTIONS: Fall  RED FLAGS: None   WEIGHT BEARING RESTRICTIONS: No  FALLS: Has patient fallen in last 6 months? No  LIVING ENVIRONMENT: Lives with: lives with their family and DTR comes by every day to assist with things  Lives in: House/apartment Stairs: Yes: External: 1 steps; none Has following equipment at home: Single point cane and Walker - 4 wheeled  PLOF: Independent with basic ADLs and Independent with household mobility with device  PATIENT GOALS: Improve mobility and function.   OBJECTIVE:  Note: Objective measures were completed at Evaluation unless otherwise noted.  DIAGNOSTIC FINDINGS: MRI 03/17/23 "1. Punctate foci of restricted diffusion in the left posterior  frontal white matter, most likely acute infarcts.  2. Numerous foci of hemosiderin deposition, which are seen in the  deep gray structures but also in the bilateral cerebral hemispheres,  with 1 new focus compared to 02/20/2023. While this could be the  sequela of chronic hypertensive microhemorrhages, this appearance is  concerning for cerebral amyloid angiopathy."  COGNITION: Overall cognitive status: difficulty with word finding, responds appropriately    POSTURE: Rounded Shoulders   LOWER EXTREMITY ROM:     WNL for tasks assessed   LOWER EXTREMITY MMT:    MMT Right Eval Left Eval  Hip flexion 3+  4  Hip extension    Hip abduction 4 4+  Hip  adduction 4+ 5  Hip internal rotation    Hip external rotation    Knee flexion 4- 4+  Knee extension 4- 4+  Ankle dorsiflexion 4 4+  Ankle plantarflexion 4+ 5  Ankle inversion    Ankle eversion    (Blank rows = not tested)   TRANSFERS: Sit to stand: Modified independence  Assistive device utilized: None     Stand to sit: Modified independence  Assistive device utilized: None     Chair to chair: Modified independence  Assistive device utilized: None       RAMP:  Not tested  CURB:  Not tested  STAIRS: Not tested GAIT: Findings: Distance walked: 33 ft and Comments: slow speed, wide BOS, short step length  FUNCTIONAL TESTS:  5 times sit to stand: 42.28 sec SpO2 response: 94-95%, heavy UE use  Timed up and go (TUG): 24 sec  2 minute walk test: test visit 2 Berg Balance Scale: 39 : .39 m/s Patient demonstrates increased fall risk as noted by score of  39 /56 on Berg Balance Scale.  (<36= high risk for falls, close to 100%; 37-45 significant >80%; 46-51 moderate >50%; 52-55 lower >25%)     PATIENT SURVEYS:  Stroke Impact Scale 16 : 45/80                                                                                                                              TREATMENT DATE: 07/18/23      TE- To improve strength, endurance, mobility, and function of specific targeted muscle groups or improve joint range of motion or improve muscle flexibility  LAQ seated 2 x 10 with 3# AW  Seated march 2 x 10 with 3# AW Seated heel raise on incline board and 3# AW donned 2 x 15 reps    TA Gait no AD x 70 ft SPO2 drop to 91% post walk  Seated exercise to follow  Spo2 recovers to 95% Gait no AD x 70 ft, SPO2 to 87% following, seated rest with pursed lip breathing to follow  NMR Adducted stance 2 x 30 sec   SPO2 after 91 %  Self Care Educated on importance of utilizing supplemental oxygen  for ability to complete prolonged physical activity. Pt educated throughout session  about proper posture and technique with exercises. Improved exercise technique, movement at target joints, use of target muscles after min to mod verbal, visual, tactile cues.   PATIENT EDUCATION: Education details: POC Person educated: Patient Education method: Explanation Education comprehension: verbalized understanding   HOME EXERCISE PROGRAM: Access Code: [facility] URL: [email or web address] Date: 07/11/2023 Prepared by: Marlynn Singer  Exercises - Wide tandem with counter support as needed   - 1 x daily - 4 x weekly - 2 sets - 30 sec hold - Seated Long Arc Quad  - 1 x daily - 4 x weekly - 2 sets -  10 reps - 2 sec  hold - Seated March  - 1 x daily - 4 x weekly - 2 sets - 20 reps - Seated Heel Toe Raises  - 1 x daily - 4 x weekly - 2 sets - 15 reps - 2 sec  hold    GOALS: Goals reviewed with patient? Yes  SHORT TERM GOALS: Target date: 08/01/2023       Patient will be independent in home exercise program to improve strength/mobility for better functional independence with ADLs. Baseline: No HEP currently  Goal status: INITIAL   LONG TERM GOALS: Target date: 09/26/2023   1.  Patient (> 65 years old) will complete five times sit to stand test in < 20 seconds indicating an increased LE strength and improved balance. Baseline: 42.28 Goal status: INITIAL  2.  Patient will improve SIS 16 score to 55   to demonstrate statistically significant improvement in mobility and quality of life as it relates to their CVA.  Baseline: 45 Goal status: INITIAL   3.  Patient will increase Berg Balance score by > 6 points to demonstrate decreased fall risk during functional activities. Baseline: 39 Goal status: INITIAL   4.   Patient will reduce timed up and go to <11 seconds to reduce fall risk and demonstrate improved transfer/gait ability. Baseline: 24 sec Goal status: INITIAL  5.   Patient will increase 10 meter walk test to >.49m/s as to improve gait speed for  better community ambulation and to reduce fall risk. Baseline: .38 m/s Goal status: INITIAL  6.   Patient will increase 2 minute walk test distance by 50 ft or greater for progression to community ambulator and demonstrate improved gait ability Baseline: 172 ft, O2 desats to 88% at end of test, recovers in 2 min to 97% after seated rest  Goal status: INITIAL    ASSESSMENT:  CLINICAL IMPRESSION: Patient arrived with good motivation for completion of pt activities.  Patient has relatively frequent SpO2 drops particularly when completing exercises requiring large upper and lower body reciprocal movement such as walking.  Recommended patient bring her portable oxygen  tank and nasal cannula next time for ease of transition to oxygen  tanks provided here at hospital.  Patient and caregiver verbalized understanding.  Patient did do well with seated exercise without any significant drop in SpO2 levels.  Patient actually recovers and SpO2 percentage when completing seated exercises. Pt will continue to benefit from skilled physical therapy intervention to address impairments, improve QOL, and attain therapy goals.    OBJECTIVE IMPAIRMENTS: Abnormal gait, cardiopulmonary status limiting activity, decreased activity tolerance, decreased balance, decreased endurance, decreased mobility, difficulty walking, and decreased strength.   ACTIVITY LIMITATIONS: carrying, lifting, standing, squatting, stairs, transfers, toileting, and locomotion level  PARTICIPATION LIMITATIONS: meal prep, cleaning, laundry, community activity, and yard work  PERSONAL FACTORS: Age and 3+ comorbidities: Pmx of CVA, COPD, Dysarthria, Ataxia, TIA, T2DM, Back pain, scoliosis  are also affecting patient's functional outcome.   REHAB POTENTIAL: Good  CLINICAL DECISION MAKING: Evolving/moderate complexity  EVALUATION COMPLEXITY: Moderate  PLAN:  PT FREQUENCY: 2x/week  PT DURATION: 12 weeks  PLANNED INTERVENTIONS: [id number]-  Physical Performance Testing, 97110-Therapeutic exercises, 97530- Therapeutic activity, 97112- Neuromuscular re-education, 97535- Self Care, [id number]- Manual therapy, [phone]- Gait training, Patient/Family education, Balance training, Stair training, and Moist heat  PLAN FOR NEXT SESSION:  monitor SPO2 and get portable oxygen  if necessary  Endurance, balance, strength ( L>R), administer HEP, gait training  If performing heavy endurance training recommend using portable  oxygen  and close SP02 monitoring.  Edwina Gram, PT 07/18/2023, 4:38 PM

## 2023-07-20 ENCOUNTER — Ambulatory Visit

## 2023-07-20 DIAGNOSIS — M6281 Muscle weakness (generalized): Secondary | ICD-10-CM | POA: Diagnosis not present

## 2023-07-20 DIAGNOSIS — I69890 Apraxia following other cerebrovascular disease: Secondary | ICD-10-CM

## 2023-07-20 DIAGNOSIS — R4701 Aphasia: Secondary | ICD-10-CM | POA: Diagnosis not present

## 2023-07-20 DIAGNOSIS — R269 Unspecified abnormalities of gait and mobility: Secondary | ICD-10-CM | POA: Diagnosis not present

## 2023-07-20 DIAGNOSIS — R262 Difficulty in walking, not elsewhere classified: Secondary | ICD-10-CM | POA: Diagnosis not present

## 2023-07-20 DIAGNOSIS — R2689 Other abnormalities of gait and mobility: Secondary | ICD-10-CM | POA: Diagnosis not present

## 2023-07-20 NOTE — Therapy (Signed)
 OUTPATIENT SPEECH LANGUAGE PATHOLOGY APHASIA TREATMENT   Patient Name: Hannah Kirby MRN: [id number] DOB:01-22-1946, 78 y.o., female Today's Date: 07/04/2023  PCP: Jeralene Mom, MD REFERRING PROVIDER: Jeralene Mom, MD     End of Session - 07/04/23 1453     Visit Number 7    Number of Visits 24    Date for SLP Re-Evaluation 08/30/23    SLP Start Time 1400    SLP Stop Time  1445    SLP Time Calculation (min) 45 min    Activity Tolerance Patient tolerated treatment well   Simultaneous filing. User may not have seen previous data.            Past Medical History:  Diagnosis Date   Acute hemorrhagic colitis 08/08/2019   Anemia    Anxiety    Arthritis    C. difficile colitis 08/09/2019   CAD (coronary artery disease)    a. 02/2006 PCI: BMS x 2 to RCA, cath o/w without significant coronary disease; b. nuclear stress test 07/2014: No ischemia/infarct; c. 11/2017 MV: no isch/infarct, EF 55-65%; d. 03/2020 NSTEMI/PCI: LM nl, LAD min irregs, RI 25, small, LCX nl, RCA 30p/m ISR, 99d (3.0x15 Resolute Onyx DES).   Cerebrovascular accident (CVA) (HCC) 03/18/2023   dysarthria   Chronic bronchitis (HCC)    secondary to cigarette smoking   Chronic kidney disease (CKD), stage III (moderate) (HCC)    COPD (chronic obstructive pulmonary disease) (HCC)    Diabetes mellitus    Diastolic dysfunction    a. 07/2019 Echo: EF 60-65%, no rwma, mod LVH. Nl RV size/fxn; b. 03/2020 Echo: EF 60-65%, mod LVH, gr1 DD, nl RV size/fxn, mild AS.   FHx: allergies    GERD (gastroesophageal reflux disease)    Goiter    Granulomatous disease (HCC)    Hernia    Hyperlipidemia    Hypertension    Kidney stone on left side 2013   Microalbuminuria    NSTEMI (non-ST elevated myocardial infarction) (HCC) 03/23/2020   Obesity    Panic attacks    PVC's (premature ventricular contractions)    a. 03/2018 Zio: Occas PVCs (2.5%). Triggered events assoc w/ PVC/PAC.   Retinal tear 2020   Smokers' cough (HCC)     Spinal stenosis    Stroke (HCC) 10/29/2016   mild left side weakness   Stroke (HCC) 08/01/2019   Tobacco abuse    Past Surgical History:  Procedure Laterality Date   ABDOMINAL HYSTERECTOMY     BREAST SURGERY     CATARACT EXTRACTION W/PHACO Right 03/01/2017   Procedure: CATARACT EXTRACTION PHACO AND INTRAOCULAR LENS PLACEMENT (IOC) RIGHT DIABETIC;  Surgeon: Annell Kidney, MD;  Location: [facility];  Service: Ophthalmology;  Laterality: Right;   CATARACT EXTRACTION W/PHACO Left 03/22/2017   Procedure: CATARACT EXTRACTION PHACO AND INTRAOCULAR LENS PLACEMENT (IOC) LEFT DIABETIC;  Surgeon: Annell Kidney, MD;  Location: [facility];  Service: Ophthalmology;  Laterality: Left;  Diabetic - insulin  and oral meds   COLONOSCOPY WITH PROPOFOL  N/A 09/23/2014   Procedure: COLONOSCOPY WITH PROPOFOL ;  Surgeon: Deveron Fly, MD;  Location: [facility] ENDOSCOPY;  Service: Endoscopy;  Laterality: N/A;   COLONOSCOPY WITH PROPOFOL  N/A 01/11/2018   Procedure: COLONOSCOPY WITH PROPOFOL ;  Surgeon: Deveron Fly, MD;  Location: [facility] ENDOSCOPY;  Service: Endoscopy;  Laterality: N/A;   COLONOSCOPY WITH PROPOFOL  N/A 04/24/2018   Procedure: COLONOSCOPY WITH PROPOFOL ;  Surgeon: Deveron Fly, MD;  Location: [facility] ENDOSCOPY;  Service: Endoscopy;  Laterality: N/A;   COLONOSCOPY WITH  PROPOFOL  N/A 11/19/2019   Procedure: COLONOSCOPY WITH PROPOFOL ;  Surgeon: Marnee Sink, MD;  Location: [facility] ENDOSCOPY;  Service: Endoscopy;  Laterality: N/A;   CORONARY ANGIOPLASTY WITH STENT PLACEMENT  2008   CORONARY STENT INTERVENTION N/A 03/24/2020   Procedure: CORONARY STENT INTERVENTION;  Surgeon: Sammy Crisp, MD;  Location: [facility] INVASIVE CV LAB;  Service: Cardiovascular;  Laterality: N/A;   ESOPHAGOGASTRODUODENOSCOPY (EGD) WITH PROPOFOL  N/A 12/30/2014   Procedure: ESOPHAGOGASTRODUODENOSCOPY (EGD) WITH PROPOFOL ;  Surgeon: Deveron Fly, MD;  Location: [facility] ENDOSCOPY;  Service: Endoscopy;  Laterality:  N/A;   ESOPHAGOGASTRODUODENOSCOPY (EGD) WITH PROPOFOL  N/A 07/19/2016   Procedure: ESOPHAGOGASTRODUODENOSCOPY (EGD) WITH PROPOFOL ;  Surgeon: Deveron Fly, MD;  Location: [facility] ENDOSCOPY;  Service: Endoscopy;  Laterality: N/A;   ESOPHAGOGASTRODUODENOSCOPY (EGD) WITH PROPOFOL  N/A 04/24/2018   Procedure: ESOPHAGOGASTRODUODENOSCOPY (EGD) WITH PROPOFOL ;  Surgeon: Deveron Fly, MD;  Location: [facility] ENDOSCOPY;  Service: Endoscopy;  Laterality: N/A;   hysterectomy (other)     LEFT HEART CATH AND CORONARY ANGIOGRAPHY N/A 03/24/2020   Procedure: LEFT HEART CATH AND CORONARY ANGIOGRAPHY;  Surgeon: Sammy Crisp, MD;  Location: [facility] INVASIVE CV LAB;  Service: Cardiovascular;  Laterality: N/A;   Patient Active Problem List   Diagnosis Date Noted   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) 03/27/2023   Dysarthria 03/17/2023   Chronic obstructive pulmonary disease (HCC) 03/17/2023   Dizziness 02/23/2023   Dehydration 02/20/2023   Restrictive airway disease 07/21/2020   Partial thickness rotator cuff tear 03/20/2020   Shoulder pain 03/05/2020   Retinal detachment, left 03/04/2020   Ataxia 02/28/2020   Difficulty walking 02/28/2020   Physical deconditioning 02/28/2020   Chronic diastolic congestive heart failure (HCC) 11/20/2019   Polyp of ascending colon    Hypokalemia 08/08/2019   TIA (transient ischemic attack) 08/01/2019   Benign hypertensive kidney disease with chronic kidney disease 04/25/2019   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   Chronic, continuous use of opioids 03/26/2018   History of adenomatous polyp of colon 01/22/2018   Ulceration of colon determined by endoscopy 01/22/2018   Diverticulosis of colon 01/22/2018   History of CVA (cerebrovascular accident) 11/08/2016   Weakness of left upper extremity 10/30/2016   AAA (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) 08/12/2016   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   Stage 3b chronic kidney disease (HCC) 07/27/2015   Chest pain at rest 05/06/2015   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HCC) 10/28/2014   Solitary pulmonary nodule on lung CT 08/20/2014   Allergic rhinitis 07/28/2014   CAFL (chronic airflow limitation) (HCC) 07/28/2014   Acid reflux 07/28/2014   Granuloma annulare    Back pain 11/12/2013   Lumbar scoliosis 10/30/2013   Lumbar radiculopathy 10/30/2013   Lumbar canal stenosis 10/30/2013   Calcium  blood increased 10/22/2013   Proteinuria 10/22/2013   Obesity 03/21/2011   Smokes with greater than 30 pack year history 03/21/2011   Edema 08/18/2010   Hyperlipidemia 03/25/2009   Coronary artery disease 03/25/2009   Primary hypertension 03/25/2009    ONSET DATE: 03/17/23 (admitted with stroke), 05/23/23 (referral date)  REFERRING DIAG:  R41.841 (ICD-10-CM) - Cognitive communication deficit  I63.9 (ICD-10-CM) - CVA (cerebral vascular accident) (HCC)  R47.02 (ICD-10-CM) - Dysphasia    THERAPY DIAG:  Aphasia  Apraxia following other cerebrovascular disease  Rationale for Evaluation and Treatment Rehabilitation  SUBJECTIVE:   SUBJECTIVE STATEMENT: Pt alert, pleasant, and cooperative. On home O2.  Pt accompanied  by: self, family member  PERTINENT HISTORY:  Pt is a 78 y.o. female who presents for communication evaluation in setting on stroke. Pt in ED 1/24-1/25/25. Pt d/c'd home with HH ST. PMHx significant of   HTN, CKD stage IIIB, DMT2 on insulin , CAD, chronic smoking, chronic respiratory failure with COPD and on home oxygen , anxiety, HLD. MRI 03/17/23 "1. Punctate foci of restricted diffusion in the left posterior  frontal white matter, most likely acute infarcts.  2. Numerous foci of hemosiderin deposition, which are seen in the  deep gray structures but also in the bilateral cerebral hemispheres,  with 1 new focus compared to 02/20/2023. While  this could be the  sequela of chronic hypertensive microhemorrhages, this appearance is  concerning for cerebral amyloid angiopathy."   PAIN:  Are you having pain? No  FALLS: defer to PT; under care of HH PT  LIVING ENVIRONMENT: Lives with: lives with their spouse Lives in: House/apartment  PLOF:  Level of assistance: Independent with ADLs Employment: Retired   PATIENT GOALS    for communication to improve   OBJECTIVE:  TODAY'S TREATMENT:  SGD programming/modification: Pt received SGD loaner device today. Initial orientation to device completed (e.g. power, volume, wifi, settings) as well as discussion re: communication modes, therapy tab, community/group programming, and customization. Additional phrases added and editing of preprogrammed phrases initiated. Pt able to select appropriate card/folder for several questions; however, assistance needed for navigating device/tabs. Handout provided re: basic functions as well as adding folders/cards. Will continue to address in upcoming sessions. Pt tasked with "playing" with SGD over the weekend.   Speech tx/supportive counseling: Pt and daughter note improvement in pt's able to respond appropriately with self-correction/revision. Pt continues to be frustrated re: CLOF for communication. Supportive counseling with emphasis on progress to date provided as appropriate. Discussed progress to date, plan of care, and role SGD will play in therapeutic process. Emphasized importance of total communication and introduced concept of scripting. Will continue to address in upcoming sessions.    PATIENT EDUCATION: Education details: as above Person educated: Patient and Child(ren) Education method: Explanation Education comprehension: verbalized understanding and needs further education  HOME EXERCISE PROGRAM:   TalkPath Therapy - on computer - if able to access  Worksheets flagged in workbooks from home  Practice SFA, supported  communication   GOALS:  Goals reviewed with patient? Yes  SHORT TERM GOALS: Target date: 10 sessions  Pt will participate in further assessment of functional reading/writing. Baseline: Goal status: MET  2.  With Moderate A, patient will complete a semantic feature analysis with at least 2 relevant features for 8/10 target words to improve word-finding skills.  Baseline:  Goal status: MET  3.  With Maximal A, patient will generate sentences with 3 or more words in response to a situation at 80% accuracy in order to increase ability to communicate basic wants and needs.  Baseline:  Goal status: MET  4.   With Maximal A,  pt will follow 2-step commands for improved participation in ADLs/iADLs with max cueing. Baseline:  Goal status:MET  5.  Pt will repeat short sentences (<4 words) with good approximations 80% of the time with max cueing. Baseline:  Goal status: MET    LONG TERM GOALS: Target date: 12 weeks  Pt will report a subjective improvement in communication per PROM.  Baseline: CES 15/32 on 06/07/23 Goal status: PROGRESSING  2.  With Min A, patient/family will demonstrate understanding of the following concepts: aphasia, spontaneous recovery, communication vs conversation,  strengths/strategies to promote success, local resources in order to increase patient's participation in medical care.    Baseline:  Goal status: PROGRESSING   ASSESSMENT:  CLINICAL IMPRESSION: Pt is a 78 y.o. female who presents for communication evaluation in setting on stroke. Pt in ED 1/24-1/25/25 for stroke. Pt d/c'd home with HH ST. PMHx significant of  HTN, CKD stage IIIB, DMT2 on insulin , CAD, chronic smoking, chronic respiratory failure with COPD and on home oxygen , anxiety, HLD. Assessment completed via functional/dynamic means including selected subtests of Western Aphasia Battery Revised (WAB-R) and PROM (Communication Effectiveness Scale). 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 Pt with halting, telegraphic speech, mostly single words, paraphasias, neologisms, severe wordfinding difficulty. Similar difficulty noted with confrontation naming and divergent naming. Auditory comprehension is a relatively strength for pt as pt with intact single word recognition and ability to answer simple and complex yes/no questions. Increased difficulty noted with pt's ability to follow 2-step (moderately complex and complex) commands. Pt with impaired repetition at the word, phase, and sentence level. 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 SGD evaluation completed 06/27/23 with SGD trial recommending. See details of assessment and tx session above. Recommend course of ST targeting functional communication, further assessment of functional reading/writing, and pt/caregiver training to help promote QoL and overall life participation.   OBJECTIVE IMPAIRMENTS include expressive language, receptive language, and aphasia. These impairments are limiting patient from ADLs/IADLs and effectively communicating at home and in community. Factors affecting potential to achieve goals and functional outcome are severity of impairments. Patient will benefit from skilled SLP services to address above impairments and improve overall function.  REHAB POTENTIAL: Good  PLAN: SLP FREQUENCY: 2x/week  SLP DURATION: 12 weeks  PLANNED INTERVENTIONS: Language facilitation, Cueing hierachy, Internal/external aids, Functional tasks, Multimodal communication approach, SLP instruction and feedback, Compensatory strategies, and Patient/family education    Dia Forget, M.S., CCC-SLP Speech-Language Pathologist [place] [facility] [phone] [place])  [place] [facility] at [facility] [address] [place], [place], [address] Phone: [phone]   Fax:  [phone]

## 2023-07-24 ENCOUNTER — Encounter: Payer: Self-pay | Admitting: Acute Care

## 2023-07-24 ENCOUNTER — Ambulatory Visit (INDEPENDENT_AMBULATORY_CARE_PROVIDER_SITE_OTHER): Admitting: Family Medicine

## 2023-07-24 ENCOUNTER — Encounter: Payer: Self-pay | Admitting: Family Medicine

## 2023-07-24 VITALS — BP 145/85 | HR 75 | Resp 16 | Wt 159.0 lb

## 2023-07-24 DIAGNOSIS — R052 Subacute cough: Secondary | ICD-10-CM

## 2023-07-24 DIAGNOSIS — J40 Bronchitis, not specified as acute or chronic: Secondary | ICD-10-CM | POA: Diagnosis not present

## 2023-07-24 MED ORDER — BENZONATATE 100 MG PO CAPS: 100.0000 mg | ORAL_CAPSULE | Freq: Three times a day (TID) | ORAL | 0 refills | Status: DC

## 2023-07-24 NOTE — Progress Notes (Signed)
 Established patient visit   Patient: Hannah Kirby   DOB: 1945/04/30   78 y.o. Female  MRN: [id number] Visit Date: 07/24/2023  Today's healthcare provider: Jeralene Mom, MD   Chief Complaint  Patient presents with   Medical Management of Chronic Issues    COPD follow-up   Subjective    Discussed the use of AI scribe software for clinical note transcription with the patient, who gave verbal consent to proceed.  History of Present Illness   SATIN BOAL is a 78 year old female who presents with persistent respiratory symptoms following a recent urgent care visit on 5-4 and diagnosed with COPD exacerbation.  She reports he is much improved since then. Despite treatment, her symptoms have not completely resolved, although they are not as severe as before. She was prescribed Augmentin , a cough medicine, and already had an inhaler, which she uses once a day. She received a breathing treatment at urgent care. No fevers, chills, or sweats. She has started taking allergy medication once a day in the evening to address concerns about facial drainage.  She has not been monitoring her blood pressure regularly. There have been no recent changes to her blood pressure medications, which include felodipine , metoprolol , and lisinopril . No swelling.       Medications: Outpatient Medications Prior to Visit  Medication Sig   acetaminophen  (TYLENOL ) 500 MG tablet Take 500 mg by mouth every 6 (six) hours as needed.   albuterol  (VENTOLIN  HFA) 108 (90 Base) MCG/ACT inhaler Inhale 2 puffs into the lungs every 4 (four) hours as needed for wheezing or shortness of breath.   aspirin  (ASPIR-81) 81 MG EC tablet Take 81 mg by mouth daily.   calcium  carbonate (TUMS - DOSED IN MG ELEMENTAL CALCIUM ) 500 MG chewable tablet Chew 1 tablet by mouth daily as needed.   clopidogrel  (PLAVIX ) 75 MG tablet Take 1 tablet (75 mg total) by mouth daily.   cyanocobalamin  1000 MCG tablet Take 1,000 mcg by mouth daily.    dapagliflozin  propanediol (FARXIGA ) 5 MG TABS tablet Take 1 tablet (5 mg total) by mouth daily before breakfast. (Patient taking differently: Take 2.5 mg by mouth 2 (two) times daily.)   Docusate Sodium  (DSS) 100 MG CAPS Take 1 capsule by mouth daily.   ezetimibe  (ZETIA ) 10 MG tablet TAKE ONE TABLET BY MOUTH EVERY DAY   felodipine  (PLENDIL ) 5 MG 24 hr tablet TAKE 1 TABLET BY MOUTH DAILY   Finerenone (KERENDIA) 10 MG TABS Take 10 mg by mouth.   Fluticasone -Umeclidin-Vilant (TRELEGY ELLIPTA ) 100-62.5-25 MCG/ACT AEPB INHALE 1 PUFF BY MOUTH EVERY DAY - DISCARD DEVICE 6 WEEKS AFTER IT IS REMOVED FROM THE FOIL TRAY OR WHEN THE DOSE COUNTER READS &quot;0&quot; (WHICHEVER COMES FIRST)   HYDROcodone -acetaminophen  (NORCO) 7.5-325 MG tablet Take 1 tablet by mouth every 6 (six) hours as needed for moderate pain (pain score 4-6).   insulin  glargine (LANTUS ) 100 UNIT/ML injection Inject 22 Units into the skin daily.   lisinopril  (ZESTRIL ) 20 MG tablet Take 1 tablet (20 mg total) by mouth daily.   meclizine  (ANTIVERT ) 25 MG tablet Take 25 mg by mouth 3 (three) times daily as needed.   metoprolol  tartrate (LOPRESSOR ) 100 MG tablet Take 1 tablet (100 mg total) by mouth 2 (two) times daily.   Multiple Vitamins-Minerals (DAILY MULTI) TABS Take 1 tablet by mouth daily.   nitroGLYCERIN  (NITROSTAT ) 0.4 MG SL tablet Place 1 tablet (0.4 mg total) under the tongue every 5 (five) minutes as needed.  Omega-3 Fatty Acids (FISH OIL ) 1000 MG CAPS Take 2 capsules by mouth in the morning and at bedtime.    ondansetron  (ZOFRAN  ODT) 4 MG disintegrating tablet Take 1 tablet (4 mg total) by mouth every 8 (eight) hours as needed for nausea or vomiting.   RABEprazole  (ACIPHEX ) 20 MG tablet Take 1 tablet (20 mg total) by mouth daily. 15 Mins. before evening meal   rosuvastatin  (CRESTOR ) 40 MG tablet Take 1 tablet (40 mg total) by mouth daily.   Semaglutide,0.25 or 0.5MG /DOS, 2 MG/1.5ML SOPN Inject 0.5 mg into the skin once a week.    sertraline (ZOLOFT) 25 MG tablet Take 1/2 tablet once daily for one week, then inrease to 1 tablet once daily and continue   sucralfate  (CARAFATE ) 1 g tablet Take 1 tablet (1 g total) by mouth 2 (two) times daily. 15 Minutes before evening meal and at bed time   [DISCONTINUED] benzonatate  (TESSALON ) 100 MG capsule Take 1 capsule (100 mg total) by mouth every 8 (eight) hours.   mirtazapine  (REMERON ) 7.5 MG tablet Take 1 tablet (7.5 mg total) by mouth at bedtime. (Patient not taking: Reported on 07/05/2023)   No facility-administered medications prior to visit.   Review of Systems  Constitutional:  Negative for appetite change, chills, fatigue and fever.  Respiratory:  Negative for chest tightness and shortness of breath.   Cardiovascular:  Negative for chest pain and palpitations.  Gastrointestinal:  Negative for abdominal pain, nausea and vomiting.  Neurological:  Negative for dizziness and weakness.       Objective    BP (!) 145/85 (BP Location: Left Arm, Patient Position: Sitting, Cuff Size: Normal)   Pulse 75   Resp 16   Wt 159 lb (72.1 kg)   SpO2 93% Comment: Room air,no oxygen   BMI 27.29 kg/m    General: Appearance:    Well developed, well nourished female in no acute distress  Eyes:    PERRL, conjunctiva/corneas clear, EOM's intact       Lungs:     Clear to auscultation bilaterally, respirations unlabored  Heart:    Normal heart rate. Normal rhythm. No murmurs, rubs, or gallops.    MS:   All extremities are intact.    Neurologic:   Awake, alert, oriented x 3. Hesitant speech pattern, but much improved from last visit.          Assessment & Plan        Bronchitis Symptoms improved but not resolved. Lungs clear. Allergies may contribute. - Send refill for cough medicine. - Continue inhaler as needed. - Continue allergy medication once daily.  Hypertension Blood pressure decreased to 130/84 after rest. No edema. Not monitoring at home. - Encourage regular home blood  pressure monitoring.  Hypertriglyceridemia Triglycerides slightly elevated. Current regimen maintained. - Continue fish oil  and rosuvastatin  as prescribed.         Jeralene Mom, MD  [facility] [phone] (phone) [phone] (fax)  [facility]

## 2023-07-25 ENCOUNTER — Ambulatory Visit: Attending: Family Medicine

## 2023-07-25 ENCOUNTER — Ambulatory Visit: Admitting: Physical Therapy

## 2023-07-25 DIAGNOSIS — M6281 Muscle weakness (generalized): Secondary | ICD-10-CM | POA: Diagnosis not present

## 2023-07-25 DIAGNOSIS — R471 Dysarthria and anarthria: Secondary | ICD-10-CM | POA: Diagnosis not present

## 2023-07-25 DIAGNOSIS — R2681 Unsteadiness on feet: Secondary | ICD-10-CM | POA: Diagnosis not present

## 2023-07-25 DIAGNOSIS — R262 Difficulty in walking, not elsewhere classified: Secondary | ICD-10-CM | POA: Insufficient documentation

## 2023-07-25 DIAGNOSIS — R269 Unspecified abnormalities of gait and mobility: Secondary | ICD-10-CM

## 2023-07-25 DIAGNOSIS — R2689 Other abnormalities of gait and mobility: Secondary | ICD-10-CM | POA: Insufficient documentation

## 2023-07-25 DIAGNOSIS — R4701 Aphasia: Secondary | ICD-10-CM | POA: Diagnosis not present

## 2023-07-25 DIAGNOSIS — I69890 Apraxia following other cerebrovascular disease: Secondary | ICD-10-CM | POA: Diagnosis not present

## 2023-07-25 NOTE — Therapy (Signed)
 OUTPATIENT SPEECH LANGUAGE PATHOLOGY APHASIA TREATMENT   Patient Name: Hannah Kirby MRN: [id number] DOB:[birth year], 78 y.o., female Today's Date: 07/04/2023  PCP: Jeralene Mom, MD REFERRING PROVIDER: Jeralene Mom, MD     End of Session - 07/04/23 1453     Visit Number 7    Number of Visits 24    Date for SLP Re-Evaluation 08/30/23    SLP Start Time 1400    SLP Stop Time  1445    SLP Time Calculation (min) 45 min    Activity Tolerance Patient tolerated treatment well   Simultaneous filing. User may not have seen previous data.            Past Medical History:  Diagnosis Date   Acute hemorrhagic colitis 08/08/2019   Anemia    Anxiety    Arthritis    C. difficile colitis 08/09/2019   CAD (coronary artery disease)    a. 02/2006 PCI: BMS x 2 to RCA, cath o/w without significant coronary disease; b. nuclear stress test 07/2014: No ischemia/infarct; c. 11/2017 MV: no isch/infarct, EF 55-65%; d. 03/2020 NSTEMI/PCI: LM nl, LAD min irregs, RI 25, small, LCX nl, RCA 30p/m ISR, 99d (3.0x15 Resolute Onyx DES).   Cerebrovascular accident (CVA) (HCC) 03/18/2023   dysarthria   Chronic bronchitis (HCC)    secondary to cigarette smoking   Chronic kidney disease (CKD), stage III (moderate) (HCC)    COPD (chronic obstructive pulmonary disease) (HCC)    Diabetes mellitus    Diastolic dysfunction    a. 07/2019 Echo: EF 60-65%, no rwma, mod LVH. Nl RV size/fxn; b. 03/2020 Echo: EF 60-65%, mod LVH, gr1 DD, nl RV size/fxn, mild AS.   FHx: allergies    GERD (gastroesophageal reflux disease)    Goiter    Granulomatous disease (HCC)    Hernia    Hyperlipidemia    Hypertension    Kidney stone on left side 2013   Microalbuminuria    NSTEMI (non-ST elevated myocardial infarction) (HCC) 03/23/2020   Obesity    Panic attacks    PVC's (premature ventricular contractions)    a. 03/2018 Zio: Occas PVCs (2.5%). Triggered events assoc w/ PVC/PAC.   Retinal tear 2020   Smokers' cough (HCC)     Spinal stenosis    Stroke (HCC) 10/29/2016   mild left side weakness   Stroke (HCC) 08/01/2019   Tobacco abuse    Past Surgical History:  Procedure Laterality Date   ABDOMINAL HYSTERECTOMY     BREAST SURGERY     CATARACT EXTRACTION W/PHACO Right 03/01/2017   Procedure: CATARACT EXTRACTION PHACO AND INTRAOCULAR LENS PLACEMENT (IOC) RIGHT DIABETIC;  Surgeon: Annell Kidney, MD;  Location: [facility];  Service: Ophthalmology;  Laterality: Right;   CATARACT EXTRACTION W/PHACO Left 03/22/2017   Procedure: CATARACT EXTRACTION PHACO AND INTRAOCULAR LENS PLACEMENT (IOC) LEFT DIABETIC;  Surgeon: Annell Kidney, MD;  Location: [facility];  Service: Ophthalmology;  Laterality: Left;  Diabetic - insulin  and oral meds   COLONOSCOPY WITH PROPOFOL  N/A 09/23/2014   Procedure: COLONOSCOPY WITH PROPOFOL ;  Surgeon: Deveron Fly, MD;  Location: [facility] END[facility]COPY;  Service: Endoscopy;  Laterality: N/A;   COLONOSCOPY WITH PROPOFOL  N/A 01/11/2018   Procedure: COLONOSCOPY WITH PROPOFOL ;  Surgeon: Deveron Fly, MD;  Location: [facility] ENDOSCOPY;  Service: Endoscopy;  Laterality: N/A;   COLONOSCOPY WITH PROPOFOL  N/A 04/24/2018   Procedure: COLONOSCOPY WITH PROPOFOL ;  Surgeon: Deveron Fly, MD;  Location: [facility] ENDOSCOPY;  Service: Endoscopy;  Laterality: N/A;   COLONOSCOPY WITH  PROPOFOL  N/A 11/19/2019   Procedure: COLONOSCOPY WITH PROPOFOL ;  Surgeon: Marnee Sink, MD;  Location: [facility] ENDOSCOPY;  Service: Endoscopy;  Laterality: N/A;   CORONARY ANGIOPLASTY WITH STENT PLACEMENT  2008   CORONARY STENT INTERVENTION N/A 03/24/2020   Procedure: CORONARY STENT INTERVENTION;  Surgeon: Sammy Crisp, MD;  Location: [facility] INVASIVE CV LAB;  Service: Cardiovascular;  Laterality: N/A;   ESOPHAGOGASTRODUODENOSCOPY (EGD) WITH PROPOFOL  N/A 12/30/2014   Procedure: ESOPHAGOGASTRODUODENOSCOPY (EGD) WITH PROPOFOL ;  Surgeon: Deveron Fly, MD;  Location: [facility] ENDOSCOPY;  Service: Endoscopy;  Laterality:  N/A;   ESOPHAGOGASTRODUODENOSCOPY (EGD) WITH PROPOFOL  N/A 07/19/2016   Procedure: ESOPHAGOGASTRODUODENOSCOPY (EGD) WITH PROPOFOL ;  Surgeon: Deveron Fly, MD;  Location: [facility] ENDOSCOPY;  Service: Endoscopy;  Laterality: N/A;   ESOPHAGOGASTRODUODENOSCOPY (EGD) WITH PROPOFOL  N/A 04/24/2018   Procedure: ESOPHAGOGASTRODUODENOSCOPY (EGD) WITH PROPOFOL ;  Surgeon: Deveron Fly, MD;  Location: [facility] ENDOSCOPY;  Service: Endoscopy;  Laterality: N/A;   hysterectomy (other)     LEFT HEART CATH AND CORONARY ANGIOGRAPHY N/A 03/24/2020   Procedure: LEFT HEART CATH AND CORONARY ANGIOGRAPHY;  Surgeon: Sammy Crisp, MD;  Location: [facility] INVASIVE CV LAB;  Service: Cardiovascular;  Laterality: N/A;   Patient Active Problem List   Diagnosis Date Noted   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) 03/27/2023   Dysarthria 03/17/2023   Chronic obstructive pulmonary disease (HCC) 03/17/2023   Dizziness 02/23/2023   Dehydration 02/20/2023   Restrictive airway disease 07/21/2020   Partial thickness rotator cuff tear 03/20/2020   Shoulder pain 03/05/2020   Retinal detachment, left 03/04/2020   Ataxia 02/28/2020   Difficulty walking 02/28/2020   Physical deconditioning 02/28/2020   Chronic diastolic congestive heart failure (HCC) 11/20/2019   Polyp of ascending colon    Hypokalemia 08/08/2019   TIA (transient ischemic attack) 08/01/2019   Benign hypertensive kidney disease with chronic kidney disease 04/25/2019   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   Chronic, continuous use of opioids 03/26/2018   History of adenomatous polyp of colon 01/22/2018   Ulceration of colon determined by endoscopy 01/22/2018   Diverticulosis of colon 01/22/2018   History of CVA (cerebrovascular accident) 11/08/2016   Weakness of left upper extremity 10/30/2016   AAA (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) 08/12/2016   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   Stage 3b chronic kidney disease (HCC) 07/27/2015   Chest pain at rest 05/06/2015   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HCC) 10/28/2014   Solitary pulmonary nodule on lung CT 08/20/2014   Allergic rhinitis 07/28/2014   CAFL (chronic airflow limitation) (HCC) 07/28/2014   Acid reflux 07/28/2014   Granuloma annulare    Back pain 11/12/2013   Lumbar scoliosis 10/30/2013   Lumbar radiculopathy 10/30/2013   Lumbar canal stenosis 10/30/2013   Calcium  blood increased 10/22/2013   Proteinuria 10/22/2013   Obesity 03/21/2011   Smokes with greater than 30 pack year history 03/21/2011   Edema 08/18/2010   Hyperlipidemia 03/25/2009   Coronary artery disease 03/25/2009   Primary hypertension 03/25/2009    ONSET DATE: 03/17/23 (admitted with stroke), 05/23/23 (referral date)  REFERRING DIAG:  R41.841 (ICD-10-CM) - Cognitive communication deficit  I63.9 (ICD-10-CM) - CVA (cerebral vascular accident) (HCC)  R47.02 (ICD-10-CM) - Dysphasia    THERAPY DIAG:  Aphasia  Apraxia following other cerebrovascular disease  Rationale for Evaluation and Treatment Rehabilitation  SUBJECTIVE:   SUBJECTIVE STATEMENT: Pt alert, pleasant, and cooperative. On home O2.  Pt accompanied  by: self, family member  PERTINENT HISTORY:  Pt is a 78 y.o. female who presents for communication evaluation in setting on stroke. Pt in ED 1/24-1/25/25. Pt d/c'd home with HH ST. PMHx significant of   HTN, CKD stage IIIB, DMT2 on insulin , CAD, chronic smoking, chronic respiratory failure with COPD and on home oxygen , anxiety, HLD. MRI 03/17/23 "1. Punctate foci of restricted diffusion in the left posterior  frontal white matter, most likely acute infarcts.  2. Numerous foci of hemosiderin deposition, which are seen in the  deep gray structures but also in the bilateral cerebral hemispheres,  with 1 new focus compared to 02/20/2023. While  this could be the  sequela of chronic hypertensive microhemorrhages, this appearance is  concerning for cerebral amyloid angiopathy."   PAIN:  Are you having pain? No  FALLS: defer to PT; under care of HH PT  LIVING ENVIRONMENT: Lives with: lives with their spouse Lives in: House/apartment  PLOF:  Level of assistance: Independent with ADLs Employment: Retired   PATIENT GOALS    for communication to improve   OBJECTIVE:  TODAY'S TREATMENT:  SGD programming/modification: Pt received SGD loaner device . Reviewed orientation to device completed (e.g. power, volume, wifi, settings) as well as discussion re: communication modes, therapy tab, community/group programming, and customization. Additional phrases added and editing of preprogrammed phrases continues. Pt able to select appropriate card/folder for several questions; however, assistance needed for navigating device/tabs. Additional time spent on type and draw tabs for quicker repair of communication breakdowns.  Speech tx: Pt answered biographical questions with 100% accuracy with use of total communication including SGD. Pt answered "this or that" questions with min/mod cues for use of total communication to help answer in complete sentence.    PATIENT EDUCATION: Education details: as above Person educated: Patient and Child(ren) Education method: Explanation Education comprehension: verbalized understanding and needs further education  HOME EXERCISE PROGRAM:   TalkPath Therapy - on computer - if able to access  Worksheets flagged in workbooks from home  Practice SFA, supported communication   GOALS:  Goals reviewed with patient? Yes  SHORT TERM GOALS: Target date: 10 sessions  Pt will participate in further assessment of functional reading/writing. Baseline: Goal status: MET  2.  With Moderate A, patient will complete a semantic feature analysis with at least 2 relevant features for 8/10 target words to improve  word-finding skills.  Baseline:  Goal status: MET  3.  With Maximal A, patient will generate sentences with 3 or more words in response to a situation at 80% accuracy in order to increase ability to communicate basic wants and needs.  Baseline:  Goal status: MET  4.   With Maximal A,  pt will follow 2-step commands for improved participation in ADLs/iADLs with max cueing. Baseline:  Goal status:MET  5.  Pt will repeat short sentences (<4 words) with good approximations 80% of the time with max cueing. Baseline:  Goal status: MET    LONG TERM GOALS: Target date: 12 weeks  Pt will report a subjective improvement in communication per PROM.  Baseline: CES 15/32 on 06/07/23 Goal status: PROGRESSING  2.  With Min A, patient/family will demonstrate understanding of the following concepts: aphasia, spontaneous recovery, communication vs conversation, strengths/strategies to promote success, local resources in order to increase patient's participation in medical care.    Baseline:  Goal status: PROGRESSING   ASSESSMENT:  CLINICAL IMPRESSION: Pt is a 78 y.o. female who presents for communication evaluation in setting on stroke. Pt  in ED 1/24-1/25/25 for stroke. Pt d/c'd home with HH ST. PMHx significant of  HTN, CKD stage IIIB, DMT2 on insulin , CAD, chronic smoking, chronic respiratory failure with COPD and on home oxygen , anxiety, HLD. Assessment completed via functional/dynamic means including selected subtests of Western Aphasia Battery Revised (WAB-R) and PROM (Communication Effectiveness Scale). 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 Pt with halting, telegraphic speech, mostly single words, paraphasias, neologisms, severe wordfinding difficulty. Similar difficulty noted with confrontation naming and divergent naming. Auditory comprehension is a relatively strength for pt as pt with intact single word recognition and ability to answer simple and complex  yes/no questions. Increased difficulty noted with pt's ability to follow 2-step (moderately complex and complex) commands. Pt with impaired repetition at the word, phase, and sentence level. 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 SGD evaluation completed 06/27/23 with SGD trial recommending. See details of assessment and tx session above. Recommend course of ST targeting functional communication, further assessment of functional reading/writing, and pt/caregiver training to help promote QoL and overall life participation.   OBJECTIVE IMPAIRMENTS include expressive language, receptive language, and aphasia. These impairments are limiting patient from ADLs/IADLs and effectively communicating at home and in community. Factors affecting potential to achieve goals and functional outcome are severity of impairments. Patient will benefit from skilled SLP services to address above impairments and improve overall function.  REHAB POTENTIAL: Good  PLAN: SLP FREQUENCY: 2x/week  SLP DURATION: 12 weeks  PLANNED INTERVENTIONS: Language facilitation, Cueing hierachy, Internal/external aids, Functional tasks, Multimodal communication approach, SLP instruction and feedback, Compensatory strategies, and Patient/family education    Dia Forget, M.S., CCC-SLP Speech-Language Pathologist [facility] [phone] [place])  [facility] at [facility] [address] [place], [place], [address] Phone: [phone]   Fax:  [phone]

## 2023-07-25 NOTE — Therapy (Signed)
 OUTPATIENT PHYSICAL THERAPY NEURO TREATMENT   Patient Name: Hannah Kirby MRN: [id number] DOB:1945/04/16, 78 y.o., female Today's Date: 07/25/2023   PCP:   Lamon Pillow, MD   REFERRING PROVIDER:    Morey Ar, NP    END OF SESSION:  PT End of Session - 07/25/23 1458     Visit Number 4    Number of Visits 24    Date for PT Re-Evaluation 09/26/23    Progress Note Due on Visit 10    PT Start Time 1445    PT Stop Time 1528    PT Time Calculation (min) 43 min    Equipment Utilized During Treatment Gait belt    Activity Tolerance Patient tolerated treatment well             Past Medical History:  Diagnosis Date   Acute hemorrhagic colitis 08/08/2019   Anemia    Anxiety    Arthritis    C. difficile colitis 08/09/2019   CAD (coronary artery disease)    a. 02/2006 PCI: BMS x 2 to RCA, cath o/w without significant coronary disease; b. nuclear stress test 07/2014: No ischemia/infarct; c. 11/2017 MV: no isch/infarct, EF 55-65%; d. 03/2020 NSTEMI/PCI: LM nl, LAD min irregs, RI 25, small, LCX nl, RCA 30p/m ISR, 99d (3.0x15 Resolute Onyx DES).   Cerebrovascular accident (CVA) (HCC) 03/18/2023   dysarthria   Chronic bronchitis (HCC)    secondary to cigarette smoking   Chronic kidney disease (CKD), stage III (moderate) (HCC)    COPD (chronic obstructive pulmonary disease) (HCC)    Diabetes mellitus    Diastolic dysfunction    a. 07/2019 Echo: EF 60-65%, no rwma, mod LVH. Nl RV size/fxn; b. 03/2020 Echo: EF 60-65%, mod LVH, gr1 DD, nl RV size/fxn, mild AS.   FHx: allergies    GERD (gastroesophageal reflux disease)    Goiter    Granulomatous disease (HCC)    Hernia    Hyperlipidemia    Hypertension    Kidney stone on left side 2013   Microalbuminuria    NSTEMI (non-ST elevated myocardial infarction) (HCC) 03/23/2020   Obesity    Panic attacks    PVC's (premature ventricular contractions)    a. 03/2018 Zio: Occas PVCs (2.5%). Triggered events assoc w/ PVC/PAC.    Retinal tear 2020   Smokers' cough (HCC)    Spinal stenosis    Stroke (HCC) 10/29/2016   mild left side weakness   Stroke (HCC) 08/01/2019   Tobacco abuse    Past Surgical History:  Procedure Laterality Date   ABDOMINAL HYSTERECTOMY     BREAST SURGERY     CATARACT EXTRACTION W/PHACO Right 03/01/2017   Procedure: CATARACT EXTRACTION PHACO AND INTRAOCULAR LENS PLACEMENT (IOC) RIGHT DIABETIC;  Surgeon: Annell Kidney, MD;  Location: [facility];  Service: Ophthalmology;  Laterality: Right;   CATARACT EXTRACTION W/PHACO Left 03/22/2017   Procedure: CATARACT EXTRACTION PHACO AND INTRAOCULAR LENS PLACEMENT (IOC) LEFT DIABETIC;  Surgeon: Annell Kidney, MD;  Location: [facility];  Service: Ophthalmology;  Laterality: Left;  Diabetic - insulin  and oral meds   COLONOSCOPY WITH PROPOFOL  N/A 09/23/2014   Procedure: COLONOSCOPY WITH PROPOFOL ;  Surgeon: Deveron Fly, MD;  Location: [facility] ENDOSCOPY;  Service: Endoscopy;  Laterality: N/A;   COLONOSCOPY WITH PROPOFOL  N/A 01/11/2018   Procedure: COLONOSCOPY WITH PROPOFOL ;  Surgeon: Deveron Fly, MD;  Location: [facility] END[facility]COPY;  Service: Endoscopy;  Laterality: N/A;   COLONOSCOPY WITH PROPOFOL  N/A 04/24/2018   Procedure: COLONOSCOPY WITH PROPOFOL ;  Surgeon: Deveron Fly, MD;  Location: [facility] ENDOSCOPY;  Service: Endoscopy;  Laterality: N/A;   COLONOSCOPY WITH PROPOFOL  N/A 11/19/2019   Procedure: COLONOSCOPY WITH PROPOFOL ;  Surgeon: Marnee Sink, MD;  Location: [facility] ENDOSCOPY;  Service: Endoscopy;  Laterality: N/A;   CORONARY ANGIOPLASTY WITH STENT PLACEMENT  2008   CORONARY STENT INTERVENTION N/A 03/24/2020   Procedure: CORONARY STENT INTERVENTION;  Surgeon: Sammy Crisp, MD;  Location: [facility] INVASIVE CV LAB;  Service: Cardiovascular;  Laterality: N/A;   ESOPHAGOGASTRODUODENOSCOPY (EGD) WITH PROPOFOL  N/A 12/30/2014   Procedure: ESOPHAGOGASTRODUODENOSCOPY (EGD) WITH PROPOFOL ;  Surgeon: Deveron Fly, MD;  Location:  [facility] ENDOSCOPY;  Service: Endoscopy;  Laterality: N/A;   ESOPHAGOGASTRODUODENOSCOPY (EGD) WITH PROPOFOL  N/A 07/19/2016   Procedure: ESOPHAGOGASTRODUODENOSCOPY (EGD) WITH PROPOFOL ;  Surgeon: Deveron Fly, MD;  Location: [facility] ENDOSCOPY;  Service: Endoscopy;  Laterality: N/A;   ESOPHAGOGASTRODUODENOSCOPY (EGD) WITH PROPOFOL  N/A 04/24/2018   Procedure: ESOPHAGOGASTRODUODENOSCOPY (EGD) WITH PROPOFOL ;  Surgeon: Deveron Fly, MD;  Location: [facility] ENDOSCOPY;  Service: Endoscopy;  Laterality: N/A;   hysterectomy (other)     LEFT HEART CATH AND CORONARY ANGIOGRAPHY N/A 03/24/2020   Procedure: LEFT HEART CATH AND CORONARY ANGIOGRAPHY;  Surgeon: Sammy Crisp, MD;  Location: [facility] INVASIVE CV LAB;  Service: Cardiovascular;  Laterality: N/A;   Patient Active Problem List   Diagnosis Date Noted   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) 03/27/2023   Dysarthria 03/17/2023   Chronic obstructive pulmonary disease (HCC) 03/17/2023   Dizziness 02/23/2023   Dehydration 02/20/2023   Restrictive airway disease 07/21/2020   Partial thickness rotator cuff tear 03/20/2020   Shoulder pain 03/05/2020   Retinal detachment, left 03/04/2020   Ataxia 02/28/2020   Difficulty walking 02/28/2020   Physical deconditioning 02/28/2020   Chronic diastolic congestive heart failure (HCC) 11/20/2019   Polyp of ascending colon    Hypokalemia 08/08/2019   TIA (transient ischemic attack) 08/01/2019   Benign hypertensive kidney disease with chronic kidney disease 04/25/2019   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   Chronic, continuous use of opioids 03/26/2018   History of adenomatous polyp of colon 01/22/2018   Ulceration of colon determined by endoscopy 01/22/2018   Diverticulosis of colon 01/22/2018   History of CVA (cerebrovascular accident) 11/08/2016   Weakness of left upper  extremity 10/30/2016   AAA (abdominal aortic aneurysm) without rupture (HCC) 08/12/2016   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   Stage 3b chronic kidney disease (HCC) 07/27/2015   Chest pain at rest 05/06/2015   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HCC) 10/28/2014   Solitary pulmonary nodule on lung CT 08/20/2014   Allergic rhinitis 07/28/2014   CAFL (chronic airflow limitation) (HCC) 07/28/2014   Acid reflux 07/28/2014   Granuloma annulare    Back pain 11/12/2013   Lumbar scoliosis 10/30/2013   Lumbar radiculopathy 10/30/2013   Lumbar canal stenosis 10/30/2013   Calcium  blood increased 10/22/2013   Proteinuria 10/22/2013   Obesity 03/21/2011   Smokes with greater than 30 pack year history 03/21/2011   Edema 08/18/2010   Hyperlipidemia 03/25/2009   Coronary artery disease 03/25/2009   Primary hypertension 03/25/2009    ONSET DATE: 03/17/23  REFERRING DIAG:  R53.1 (ICD-10-CM) - Left-sided weakness  I63.9 (ICD-10-CM) - Cerebrovascular accident (CVA), unspecified mechanism (HCC)   THERAPY DIAG:  Abnormality of gait and mobility  Difficulty in walking, not elsewhere classified  Muscle weakness (generalized)  Other abnormalities of gait and mobility  Unsteadiness on feet  Rationale for Evaluation and Treatment: Rehabilitation  SUBJECTIVE:                                                                                                                                                                                             SUBJECTIVE STATEMENT:  Pt reports soreness after last visit. Feeling better now.   FROM EVAL: Pt presents with DTR and portable oxygen  tank, uses 2-3 L of oxygen  when using it. Pt has word finding difficulty making subjective info somewhat limited. PT provides her with time to answer to the best of her ability. Pt instructed we could use oxygen  tank provided here if needed. Pt was doing HH PT working on strength, balance, mobility generally. Pt has had  no falls in last 6 months. Pt states she would like to improve her ability to move around more. Pt wants to gain independence to perform cooking activity, gardening, etc. Pt feels limited mostly due to endurance with standing.   Pt accompanied by: self and family member  PERTINENT HISTORY: Pmx of CVA, COPD, Dysarthria, Ataxia, TIA, T2DM, Back pain, scoliosis   PAIN:  Are you having pain? Yes: NPRS scale: 3 Pain location: low back Aggravating factors: standing long time, sleeping    PRECAUTIONS: Fall  RED FLAGS: None   WEIGHT BEARING RESTRICTIONS: No  FALLS: Has patient fallen in last 6 months? No  LIVING ENVIRONMENT: Lives with: lives with their family and DTR comes by every day to assist with things  Lives in: House/apartment Stairs: Yes: External: 1 steps; none Has following equipment at home: Single point cane and Walker - 4 wheeled  PLOF: Independent with basic ADLs and Independent with household mobility with device  PATIENT GOALS: Improve mobility and function.   OBJECTIVE:  Note: Objective measures were completed at Evaluation unless otherwise noted.  DIAGNOSTIC FINDINGS: MRI 03/17/23 "1. Punctate foci of restricted diffusion in the left posterior  frontal white matter, most likely acute infarcts.  2. Numerous foci of hemosiderin deposition, which are seen in the  deep gray structures but also in the bilateral cerebral hemispheres,  with 1 new focus compared to 02/20/2023. While this could be the  sequela of chronic hypertensive microhemorrhages, this appearance is  concerning for cerebral amyloid angiopathy."  COGNITION: Overall cognitive status: difficulty with word finding, responds appropriately    POSTURE: Rounded Shoulders   LOWER EXTREMITY ROM:     WNL for tasks assessed   LOWER EXTREMITY MMT:    MMT Right Eval Left Eval  Hip flexion 3+ 4  Hip extension    Hip abduction 4 4+  Hip  adduction 4+ 5  Hip internal rotation    Hip external rotation     Knee flexion 4- 4+  Knee extension 4- 4+  Ankle dorsiflexion 4 4+  Ankle plantarflexion 4+ 5  Ankle inversion    Ankle eversion    (Blank rows = not tested)   TRANSFERS: Sit to stand: Modified independence  Assistive device utilized: None     Stand to sit: Modified independence  Assistive device utilized: None     Chair to chair: Modified independence  Assistive device utilized: None       RAMP:  Not tested  CURB:  Not tested  STAIRS: Not tested GAIT: Findings: Distance walked: 33 ft and Comments: slow speed, wide BOS, short step length  FUNCTIONAL TESTS:  5 times sit to stand: 42.28 sec SpO2 response: 94-95%, heavy UE use  Timed up and go (TUG): 24 sec  2 minute walk test: test visit 2 Berg Balance Scale: 39 : .39 m/s Patient demonstrates increased fall risk as noted by score of  39 /56 on Berg Balance Scale.  (<36= high risk for falls, close to 100%; 37-45 significant >80%; 46-51 moderate >50%; 52-55 lower >25%)     PATIENT SURVEYS:  Stroke Impact Scale 16 : 45/80                                                                                                                              TREATMENT DATE: 07/25/23      TE- To improve strength, endurance, mobility, and function of specific targeted muscle groups or improve joint range of motion or improve muscle flexibility  LAQ seated 2 x 15 with 1.5# AW , SPO2 drops to 91% during this, donned nasal canula and portable oxygen  from clinic at 3 L per her home use Seated march 2 x 15 with 1.5# AW SPO2% remains over 95% with nasal canular donned   Nustep level 1 x 6 min with B UE and LE reciprocal movements  SPO2 over 97% following  TA: Gait without AD x 130 ft, SPO2 at 93% following iwthin 1 min recovers to 95%   STS 2 x 5 reps, cues for forward weight shift   NMR: To facilitate reeducation of movement, balance, posture, coordination, and/or proprioception/kinesthetic sense.  SLS progression 1 LE airex,  other on step ant 2 x 30 sec ea    Self Care: Instructed in ways that utilizing her portable oxygen  may benefit her overall function including making her less fatigued, preventing muscle soreness from exercise, and improving her overall function with ambulatory tasks.  PATIENT EDUCATION: Education details: POC Person educated: Patient Education method: Explanation Education comprehension: verbalized understanding   HOME EXERCISE PROGRAM: Access Code: [facility] URL: [email or web address] Date: 07/11/2023 Prepared by: Marlynn Singer  Exercises - Wide tandem with counter support as needed   - 1 x daily - 4 x weekly - 2 sets - 30 sec hold - Seated Long Arc Quad  -  1 x daily - 4 x weekly - 2 sets - 10 reps - 2 sec  hold - Seated March  - 1 x daily - 4 x weekly - 2 sets - 20 reps - Seated Heel Toe Raises  - 1 x daily - 4 x weekly - 2 sets - 15 reps - 2 sec  hold    GOALS: Goals reviewed with patient? Yes  SHORT TERM GOALS: Target date: 08/01/2023       Patient will be independent in home exercise program to improve strength/mobility for better functional independence with ADLs. Baseline: No HEP currently  Goal status: INITIAL   LONG TERM GOALS: Target date: 09/26/2023   1.  Patient (> 82 years old) will complete five times sit to stand test in < 20 seconds indicating an increased LE strength and improved balance. Baseline: 42.28 Goal status: INITIAL  2.  Patient will improve SIS 16 score to 55   to demonstrate statistically significant improvement in mobility and quality of life as it relates to their CVA.  Baseline: 45 Goal status: INITIAL   3.  Patient will increase Berg Balance score by > 6 points to demonstrate decreased fall risk during functional activities. Baseline: 39 Goal status: INITIAL   4.   Patient will reduce timed up and go to <11 seconds to reduce fall risk and demonstrate improved transfer/gait ability. Baseline: 24 sec Goal status:  INITIAL  5.   Patient will increase 10 meter walk test to >.56m/s as to improve gait speed for better community ambulation and to reduce fall risk. Baseline: .38 m/s Goal status: INITIAL  6.   Patient will increase 2 minute walk test distance by 50 ft or greater for progression to community ambulator and demonstrate improved gait ability Baseline: 172 ft, O2 desats to 88% at end of test, recovers in 2 min to 97% after seated rest  Goal status: INITIAL    ASSESSMENT:  CLINICAL IMPRESSION: Patient arrived with good motivation for completion of pt activities.  Patient responded well to having portable oxygen  tank with interventions involving large lower extremity and upper extremity muscle groups including walking as well as NuStep activity.  Overall patient appeared to be fatiguing less throughout session as well.  Will continue to utilize oxygen  at 3 L as we continue to progress with therapeutic interventions in order to allow for more improved mobility as well as improved tolerance to exercise while keeping SpO2 percentage levels within a safe range.  Pt w patient showing much improved ability to walk at increased duration with no noticeable loss of balance this date but patient still has very slow gait speed with this.  In addition patient's sit to stands continue to be a challenge indicating continue lower extremity strength deficits.  Pt will continue to benefit from skilled physical therapy intervention to address impairments, improve QOL, and attain therapy goals.    OBJECTIVE IMPAIRMENTS: Abnormal gait, cardiopulmonary status limiting activity, decreased activity tolerance, decreased balance, decreased endurance, decreased mobility, difficulty walking, and decreased strength.   ACTIVITY LIMITATIONS: carrying, lifting, standing, squatting, stairs, transfers, toileting, and locomotion level  PARTICIPATION LIMITATIONS: meal prep, cleaning, laundry, community activity, and yard  work  PERSONAL FACTORS: Age and 3+ comorbidities: Pmx of CVA, COPD, Dysarthria, Ataxia, TIA, T2DM, Back pain, scoliosis  are also affecting patient's functional outcome.   REHAB POTENTIAL: Good  CLINICAL DECISION MAKING: Evolving/moderate complexity  EVALUATION COMPLEXITY: Moderate  PLAN:  PT FREQUENCY: 2x/week  PT DURATION: 12 weeks  PLANNED INTERVENTIONS:  16109- Physical Performance Testing, 97110-Therapeutic exercises, 97530- Therapeutic activity, [id number]- Neuromuscular re-education, 97535- Self Care, [id number]- Manual therapy, [phone]- Gait training, Patient/Family education, Balance training, Stair training, and Moist heat  PLAN FOR NEXT SESSION:  monitor SPO2 and get portable oxygen  if necessary  Endurance, balance, strength ( L>R),gait training  If performing heavy endurance training recommend using portable oxygen  and close SP02 monitoring.  Edwina Gram, PT 07/25/2023, 2:58 PM

## 2023-07-27 ENCOUNTER — Ambulatory Visit: Admitting: Physical Therapy

## 2023-07-27 ENCOUNTER — Ambulatory Visit: Admitting: Speech Pathology

## 2023-07-27 DIAGNOSIS — R262 Difficulty in walking, not elsewhere classified: Secondary | ICD-10-CM

## 2023-07-27 DIAGNOSIS — M6281 Muscle weakness (generalized): Secondary | ICD-10-CM

## 2023-07-27 DIAGNOSIS — R2689 Other abnormalities of gait and mobility: Secondary | ICD-10-CM

## 2023-07-27 DIAGNOSIS — R4701 Aphasia: Secondary | ICD-10-CM | POA: Diagnosis not present

## 2023-07-27 DIAGNOSIS — R471 Dysarthria and anarthria: Secondary | ICD-10-CM | POA: Diagnosis not present

## 2023-07-27 DIAGNOSIS — I69890 Apraxia following other cerebrovascular disease: Secondary | ICD-10-CM | POA: Diagnosis not present

## 2023-07-27 DIAGNOSIS — R269 Unspecified abnormalities of gait and mobility: Secondary | ICD-10-CM

## 2023-07-27 DIAGNOSIS — R2681 Unsteadiness on feet: Secondary | ICD-10-CM

## 2023-07-27 NOTE — Therapy (Unsigned)
 OUTPATIENT SPEECH LANGUAGE PATHOLOGY APHASIA TREATMENT   Patient Name: Hannah Kirby MRN: [id number] DOB:1945/03/01, 78 y.o., female Today's Date: 07/04/2023  PCP: Jeralene Mom, MD REFERRING PROVIDER: Jeralene Mom, MD     End of Session - 07/04/23 1453     Visit Number 7    Number of Visits 24    Date for SLP Re-Evaluation 08/30/23    SLP Start Time 1400    SLP Stop Time  1445    SLP Time Calculation (min) 45 min    Activity Tolerance Patient tolerated treatment well   Simultaneous filing. User may not have seen previous data.            Past Medical History:  Diagnosis Date   Acute hemorrhagic colitis 08/08/2019   Anemia    Anxiety    Arthritis    C. difficile colitis 08/09/2019   CAD (coronary artery disease)    a. 02/2006 PCI: BMS x 2 to RCA, cath o/w without significant coronary disease; b. nuclear stress test 07/2014: No ischemia/infarct; c. 11/2017 MV: no isch/infarct, EF 55-65%; d. 03/2020 NSTEMI/PCI: LM nl, LAD min irregs, RI 25, small, LCX nl, RCA 30p/m ISR, 99d (3.0x15 Resolute Onyx DES).   Cerebrovascular accident (CVA) (HCC) 03/18/2023   dysarthria   Chronic bronchitis (HCC)    secondary to cigarette smoking   Chronic kidney disease (CKD), stage III (moderate) (HCC)    COPD (chronic obstructive pulmonary disease) (HCC)    Diabetes mellitus    Diastolic dysfunction    a. 07/2019 Echo: EF 60-65%, no rwma, mod LVH. Nl RV size/fxn; b. 03/2020 Echo: EF 60-65%, mod LVH, gr1 DD, nl RV size/fxn, mild AS.   FHx: allergies    GERD (gastroesophageal reflux disease)    Goiter    Granulomatous disease (HCC)    Hernia    Hyperlipidemia    Hypertension    Kidney stone on left side 2013   Microalbuminuria    NSTEMI (non-ST elevated myocardial infarction) (HCC) 03/23/2020   Obesity    Panic attacks    PVC's (premature ventricular contractions)    a. 03/2018 Zio: Occas PVCs (2.5%). Triggered events assoc w/ PVC/PAC.   Retinal tear 2020   Smokers' cough (HCC)     Spinal stenosis    Stroke (HCC) 10/29/2016   mild left side weakness   Stroke (HCC) 08/01/2019   Tobacco abuse    Past Surgical History:  Procedure Laterality Date   ABDOMINAL HYSTERECTOMY     BREAST SURGERY     CATARACT EXTRACTION W/PHACO Right 03/01/2017   Procedure: CATARACT EXTRACTION PHACO AND INTRAOCULAR LENS PLACEMENT (IOC) RIGHT DIABETIC;  Surgeon: Annell Kidney, MD;  Location: [facility];  Service: Ophthalmology;  Laterality: Right;   CATARACT EXTRACTION W/PHACO Left 03/22/2017   Procedure: CATARACT EXTRACTION PHACO AND INTRAOCULAR LENS PLACEMENT (IOC) LEFT DIABETIC;  Surgeon: Annell Kidney, MD;  Location: [facility];  Service: Ophthalmology;  Laterality: Left;  Diabetic - insulin  and oral meds   COLONOSCOPY WITH PROPOFOL  N/A 09/23/2014   Procedure: COLONOSCOPY WITH PROPOFOL ;  Surgeon: Deveron Fly, MD;  Location: [facility] ENDOSCOPY;  Service: Endoscopy;  Laterality: N/A;   COLONOSCOPY WITH PROPOFOL  N/A 01/11/2018   Procedure: COLONOSCOPY WITH PROPOFOL ;  Surgeon: Deveron Fly, MD;  Location: [facility] ENDOSCOPY;  Service: Endoscopy;  Laterality: N/A;   COLONOSCOPY WITH PROPOFOL  N/A 04/24/2018   Procedure: COLONOSCOPY WITH PROPOFOL ;  Surgeon: Deveron Fly, MD;  Location: [facility] ENDOSCOPY;  Service: Endoscopy;  Laterality: N/A;   COLONOSCOPY WITH  PROPOFOL  N/A 11/19/2019   Procedure: COLONOSCOPY WITH PROPOFOL ;  Surgeon: Marnee Sink, MD;  Location: [facility] ENDOSCOPY;  Service: Endoscopy;  Laterality: N/A;   CORONARY ANGIOPLASTY WITH STENT PLACEMENT  2008   CORONARY STENT INTERVENTION N/A 03/24/2020   Procedure: CORONARY STENT INTERVENTION;  Surgeon: Sammy Crisp, MD;  Location: [facility] INVASIVE CV LAB;  Service: Cardiovascular;  Laterality: N/A;   ESOPHAGOGASTRODUODENOSCOPY (EGD) WITH PROPOFOL  N/A 12/30/2014   Procedure: ESOPHAGOGASTRODUODENOSCOPY (EGD) WITH PROPOFOL ;  Surgeon: Deveron Fly, MD;  Location: [facility] ENDOSCOPY;  Service: Endoscopy;  Laterality:  N/A;   ESOPHAGOGASTRODUODENOSCOPY (EGD) WITH PROPOFOL  N/A 07/19/2016   Procedure: ESOPHAGOGASTRODUODENOSCOPY (EGD) WITH PROPOFOL ;  Surgeon: Deveron Fly, MD;  Location: [facility] ENDOSCOPY;  Service: Endoscopy;  Laterality: N/A;   ESOPHAGOGASTRODUODENOSCOPY (EGD) WITH PROPOFOL  N/A 04/24/2018   Procedure: ESOPHAGOGASTRODUODENOSCOPY (EGD) WITH PROPOFOL ;  Surgeon: Deveron Fly, MD;  Location: [facility] ENDOSCOPY;  Service: Endoscopy;  Laterality: N/A;   hysterectomy (other)     LEFT HEART CATH AND CORONARY ANGIOGRAPHY N/A 03/24/2020   Procedure: LEFT HEART CATH AND CORONARY ANGIOGRAPHY;  Surgeon: Sammy Crisp, MD;  Location: [facility] INVASIVE CV LAB;  Service: Cardiovascular;  Laterality: N/A;   Patient Active Problem List   Diagnosis Date Noted   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) 03/27/2023   Dysarthria 03/17/2023   Chronic obstructive pulmonary disease (HCC) 03/17/2023   Dizziness 02/23/2023   Dehydration 02/20/2023   Restrictive airway disease 07/21/2020   Partial thickness rotator cuff tear 03/20/2020   Shoulder pain 03/05/2020   Retinal detachment, left 03/04/2020   Ataxia 02/28/2020   Difficulty walking 02/28/2020   Physical deconditioning 02/28/2020   Chronic diastolic congestive heart failure (HCC) 11/20/2019   Polyp of ascending colon    Hypokalemia 08/08/2019   TIA (transient ischemic attack) 08/01/2019   Benign hypertensive kidney disease with chronic kidney disease 04/25/2019   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   Chronic, continuous use of opioids 03/26/2018   History of adenomatous polyp of colon 01/22/2018   Ulceration of colon determined by endoscopy 01/22/2018   Diverticulosis of colon 01/22/2018   History of CVA (cerebrovascular accident) 11/08/2016   Weakness of left upper extremity 10/30/2016   AAA (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) 08/12/2016   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   Stage 3b chronic kidney disease (HCC) 07/27/2015   Chest pain at rest 05/06/2015   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HCC) 10/28/2014   Solitary pulmonary nodule on lung CT 08/20/2014   Allergic rhinitis 07/28/2014   CAFL (chronic airflow limitation) (HCC) 07/28/2014   Acid reflux 07/28/2014   Granuloma annulare    Back pain 11/12/2013   Lumbar scoliosis 10/30/2013   Lumbar radiculopathy 10/30/2013   Lumbar canal stenosis 10/30/2013   Calcium  blood increased 10/22/2013   Proteinuria 10/22/2013   Obesity 03/21/2011   Smokes with greater than 30 pack year history 03/21/2011   Edema 08/18/2010   Hyperlipidemia 03/25/2009   Coronary artery disease 03/25/2009   Primary hypertension 03/25/2009    ONSET DATE: 03/17/23 (admitted with stroke), 05/23/23 (referral date)  REFERRING DIAG:  R41.841 (ICD-10-CM) - Cognitive communication deficit  I63.9 (ICD-10-CM) - CVA (cerebral vascular accident) (HCC)  R47.02 (ICD-10-CM) - Dysphasia    THERAPY DIAG:  Aphasia  Apraxia following other cerebrovascular disease  Rationale for Evaluation and Treatment Rehabilitation  SUBJECTIVE:   SUBJECTIVE STATEMENT: Pt alert, pleasant, and cooperative. On home O2.  Pt accompanied  by: self, family member  PERTINENT HISTORY:  Pt is a 78 y.o. female who presents for communication evaluation in setting on stroke. Pt in ED 1/24-1/25/25. Pt d/c'd home with HH ST. PMHx significant of   HTN, CKD stage IIIB, DMT2 on insulin , CAD, chronic smoking, chronic respiratory failure with COPD and on home oxygen , anxiety, HLD. MRI 03/17/23 "1. Punctate foci of restricted diffusion in the left posterior  frontal white matter, most likely acute infarcts.  2. Numerous foci of hemosiderin deposition, which are seen in the  deep gray structures but also in the bilateral cerebral hemispheres,  with 1 new focus compared to 02/20/2023. While  this could be the  sequela of chronic hypertensive microhemorrhages, this appearance is  concerning for cerebral amyloid angiopathy."   PAIN:  Are you having pain? No  FALLS: defer to PT; under care of HH PT  LIVING ENVIRONMENT: Lives with: lives with their spouse Lives in: House/apartment  PLOF:  Level of assistance: Independent with ADLs Employment: Retired   PATIENT GOALS    for communication to improve   OBJECTIVE:  TODAY'S TREATMENT:  SGD programming/modification: Pt received SGD loaner device . Reviewed orientation to device completed (e.g. power, volume, wifi, settings) as well as discussion re: communication modes, therapy tab, community/group programming, and customization. Additional phrases added and editing of preprogrammed phrases continues. Pt able to select appropriate card/folder for several questions; however, assistance needed for navigating device/tabs. Additional time spent on type and draw tabs for quicker repair of communication breakdowns.  Speech tx: Pt answered biographical questions with 100% accuracy with use of total communication including SGD. Pt answered "this or that" questions with min/mod cues for use of total communication to help answer in complete sentence.    PATIENT EDUCATION: Education details: as above Person educated: Patient and Child(ren) Education method: Explanation Education comprehension: verbalized understanding and needs further education  HOME EXERCISE PROGRAM:   TalkPath Therapy - on computer - if able to access  Worksheets flagged in workbooks from home  Practice SFA, supported communication   GOALS:  Goals reviewed with patient? Yes  SHORT TERM GOALS: Target date: 10 sessions  Pt will participate in further assessment of functional reading/writing. Baseline: Goal status: MET  2.  With Moderate A, patient will complete a semantic feature analysis with at least 2 relevant features for 8/10 target words to improve  word-finding skills.  Baseline:  Goal status: MET  3.  With Maximal A, patient will generate sentences with 3 or more words in response to a situation at 80% accuracy in order to increase ability to communicate basic wants and needs.  Baseline:  Goal status: MET  4.   With Maximal A,  pt will follow 2-step commands for improved participation in ADLs/iADLs with max cueing. Baseline:  Goal status:MET  5.  Pt will repeat short sentences (<4 words) with good approximations 80% of the time with max cueing. Baseline:  Goal status: MET    LONG TERM GOALS: Target date: 12 weeks  Pt will report a subjective improvement in communication per PROM.  Baseline: CES 15/32 on 06/07/23 Goal status: PROGRESSING  2.  With Min A, patient/family will demonstrate understanding of the following concepts: aphasia, spontaneous recovery, communication vs conversation, strengths/strategies to promote success, local resources in order to increase patient's participation in medical care.    Baseline:  Goal status: PROGRESSING   ASSESSMENT:  CLINICAL IMPRESSION: Pt is a 78 y.o. female who presents for communication evaluation in setting on stroke. Pt  in ED 1/24-1/25/25 for stroke. Pt d/c'd home with HH ST. PMHx significant of  HTN, CKD stage IIIB, DMT2 on insulin , CAD, chronic smoking, chronic respiratory failure with COPD and on home oxygen , anxiety, HLD. Assessment completed via functional/dynamic means including selected subtests of Western Aphasia Battery Revised (WAB-R) and PROM (Communication Effectiveness Scale). 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 Pt with halting, telegraphic speech, mostly single words, paraphasias, neologisms, severe wordfinding difficulty. Similar difficulty noted with confrontation naming and divergent naming. Auditory comprehension is a relatively strength for pt as pt with intact single word recognition and ability to answer simple and complex  yes/no questions. Increased difficulty noted with pt's ability to follow 2-step (moderately complex and complex) commands. Pt with impaired repetition at the word, phase, and sentence level. 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 SGD evaluation completed 06/27/23 with SGD trial recommending. See details of assessment and tx session above. Recommend course of ST targeting functional communication, further assessment of functional reading/writing, and pt/caregiver training to help promote QoL and overall life participation.   OBJECTIVE IMPAIRMENTS include expressive language, receptive language, and aphasia. These impairments are limiting patient from ADLs/IADLs and effectively communicating at home and in community. Factors affecting potential to achieve goals and functional outcome are severity of impairments. Patient will benefit from skilled SLP services to address above impairments and improve overall function.  REHAB POTENTIAL: Good  PLAN: SLP FREQUENCY: 2x/week  SLP DURATION: 12 weeks  PLANNED INTERVENTIONS: Language facilitation, Cueing hierachy, Internal/external aids, Functional tasks, Multimodal communication approach, SLP instruction and feedback, Compensatory strategies, and Patient/family education   Peder Allums B. Garlin Junker, M.S., CCC-SLP, Tree surgeon Certified Brain Injury Specialist [facility]  [facility] Rehabilitation Services Office [phone] Ascom [phone] Fax [phone]

## 2023-07-27 NOTE — Therapy (Signed)
 OUTPATIENT PHYSICAL THERAPY NEURO TREATMENT   Patient Name: Hannah Kirby MRN: [id number] DOB:[birth year], 78 y.o., female Today's Date: 07/27/2023   PCP:   Lamon Pillow, MD   REFERRING PROVIDER:    Morey Ar, NP    END OF SESSION:   PT End of Session - 07/27/23 1447     Visit Number 5    Number of Visits 24    Date for PT Re-Evaluation 09/26/23    Progress Note Due on Visit 10    PT Start Time 1447    PT Stop Time 1530    PT Time Calculation (min) 43 min    Equipment Utilized During Treatment Gait belt;Oxygen     Activity Tolerance Patient tolerated treatment well    Behavior During Therapy WFL for tasks assessed/performed;Flat affect              Past Medical History:  Diagnosis Date   Acute hemorrhagic colitis 08/08/2019   Anemia    Anxiety    Arthritis    C. difficile colitis 08/09/2019   CAD (coronary artery disease)    a. 02/2006 PCI: BMS x 2 to RCA, cath o/w without significant coronary disease; b. nuclear stress test 07/2014: No ischemia/infarct; c. 11/2017 MV: no isch/infarct, EF 55-65%; d. 03/2020 NSTEMI/PCI: LM nl, LAD min irregs, RI 25, small, LCX nl, RCA 30p/m ISR, 99d (3.0x15 Resolute Onyx DES).   Cerebrovascular accident (CVA) (HCC) 03/18/2023   dysarthria   Chronic bronchitis (HCC)    secondary to cigarette smoking   Chronic kidney disease (CKD), stage III (moderate) (HCC)    COPD (chronic obstructive pulmonary disease) (HCC)    Diabetes mellitus    Diastolic dysfunction    a. 07/2019 Echo: EF 60-65%, no rwma, mod LVH. Nl RV size/fxn; b. 03/2020 Echo: EF 60-65%, mod LVH, gr1 DD, nl RV size/fxn, mild AS.   FHx: allergies    GERD (gastroesophageal reflux disease)    Goiter    Granulomatous disease (HCC)    Hernia    Hyperlipidemia    Hypertension    Kidney stone on left side 2013   Microalbuminuria    NSTEMI (non-ST elevated myocardial infarction) (HCC) 03/23/2020   Obesity    Panic attacks    PVC's (premature ventricular  contractions)    a. 03/2018 Zio: Occas PVCs (2.5%). Triggered events assoc w/ PVC/PAC.   Retinal tear 2020   Smokers' cough (HCC)    Spinal stenosis    Stroke (HCC) 10/29/2016   mild left side weakness   Stroke (HCC) 08/01/2019   Tobacco abuse    Past Surgical History:  Procedure Laterality Date   ABDOMINAL HYSTERECTOMY     BREAST SURGERY     CATARACT EXTRACTION W/PHACO Right 03/01/2017   Procedure: CATARACT EXTRACTION PHACO AND INTRAOCULAR LENS PLACEMENT (IOC) RIGHT DIABETIC;  Surgeon: Annell Kidney, MD;  Location: [facility];  Service: Ophthalmology;  Laterality: Right;   CATARACT EXTRACTION W/PHACO Left 03/22/2017   Procedure: CATARACT EXTRACTION PHACO AND INTRAOCULAR LENS PLACEMENT (IOC) LEFT DIABETIC;  Surgeon: Annell Kidney, MD;  Location: [facility];  Service: Ophthalmology;  Laterality: Left;  Diabetic - insulin  and oral meds   COLONOSCOPY WITH PROPOFOL  N/A 09/23/2014   Procedure: COLONOSCOPY WITH PROPOFOL ;  Surgeon: Deveron Fly, MD;  Location: [facility] ENDOSCOPY;  Service: Endoscopy;  Laterality: N/A;   COLONOSCOPY WITH PROPOFOL  N/A 01/11/2018   Procedure: COLONOSCOPY WITH PROPOFOL ;  Surgeon: Deveron Fly, MD;  Location: [facility] ENDOSCOPY;  Service: Endoscopy;  Laterality: N/A;  COLONOSCOPY WITH PROPOFOL  N/A 04/24/2018   Procedure: COLONOSCOPY WITH PROPOFOL ;  Surgeon: Deveron Fly, MD;  Location: [facility];  Service: Endoscopy;  Laterality: N/A;   COLONOSCOPY WITH PROPOFOL  N/A 11/19/2019   Procedure: COLONOSCOPY WITH PROPOFOL ;  Surgeon: Marnee Sink, MD;  Location: [facility] ENDOSCOPY;  Service: Endoscopy;  Laterality: N/A;   CORONARY ANGIOPLASTY WITH STENT PLACEMENT  2008   CORONARY STENT INTERVENTION N/A 03/24/2020   Procedure: CORONARY STENT INTERVENTION;  Surgeon: Sammy Crisp, MD;  Location: [facility] INVASIVE CV LAB;  Service: Cardiovascular;  Laterality: N/A;   ESOPHAGOGASTRODUODENOSCOPY (EGD) WITH PROPOFOL  N/A 12/30/2014   Procedure:  ESOPHAGOGASTRODUODENOSCOPY (EGD) WITH PROPOFOL ;  Surgeon: Deveron Fly, MD;  Location: [facility] END[facility]COPY;  Service: Endoscopy;  Laterality: N/A;   ESOPHAGOGASTRODUODENOSCOPY (EGD) WITH PROPOFOL  N/A 07/19/2016   Procedure: ESOPHAGOGASTRODUODENOSCOPY (EGD) WITH PROPOFOL ;  Surgeon: Deveron Fly, MD;  Location: [facility] ENDOSCOPY;  Service: Endoscopy;  Laterality: N/A;   ESOPHAGOGASTRODUODENOSCOPY (EGD) WITH PROPOFOL  N/A 04/24/2018   Procedure: ESOPHAGOGASTRODUODENOSCOPY (EGD) WITH PROPOFOL ;  Surgeon: Deveron Fly, MD;  Location: [facility] ENDOSCOPY;  Service: Endoscopy;  Laterality: N/A;   hysterectomy (other)     LEFT HEART CATH AND CORONARY ANGIOGRAPHY N/A 03/24/2020   Procedure: LEFT HEART CATH AND CORONARY ANGIOGRAPHY;  Surgeon: Sammy Crisp, MD;  Location: [facility] INVASIVE CV LAB;  Service: Cardiovascular;  Laterality: N/A;   Patient Active Problem List   Diagnosis Date Noted   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) 03/27/2023   Dysarthria 03/17/2023   Chronic obstructive pulmonary disease (HCC) 03/17/2023   Dizziness 02/23/2023   Dehydration 02/20/2023   Restrictive airway disease 07/21/2020   Partial thickness rotator cuff tear 03/20/2020   Shoulder pain 03/05/2020   Retinal detachment, left 03/04/2020   Ataxia 02/28/2020   Difficulty walking 02/28/2020   Physical deconditioning 02/28/2020   Chronic diastolic congestive heart failure (HCC) 11/20/2019   Polyp of ascending colon    Hypokalemia 08/08/2019   TIA (transient ischemic attack) 08/01/2019   Benign hypertensive kidney disease with chronic kidney disease 04/25/2019   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   Chronic, continuous use of opioids 03/26/2018   History of adenomatous polyp of colon 01/22/2018   Ulceration of colon determined by endoscopy 01/22/2018   Diverticulosis of colon  01/22/2018   History of CVA (cerebrovascular accident) 11/08/2016   Weakness of left upper extremity 10/30/2016   AAA (abdominal aortic aneurysm) without rupture (HCC) 08/12/2016   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   Stage 3b chronic kidney disease (HCC) 07/27/2015   Chest pain at rest 05/06/2015   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HCC) 10/28/2014   Solitary pulmonary nodule on lung CT 08/20/2014   Allergic rhinitis 07/28/2014   CAFL (chronic airflow limitation) (HCC) 07/28/2014   Acid reflux 07/28/2014   Granuloma annulare    Back pain 11/12/2013   Lumbar scoliosis 10/30/2013   Lumbar radiculopathy 10/30/2013   Lumbar canal stenosis 10/30/2013   Calcium  blood increased 10/22/2013   Proteinuria 10/22/2013   Obesity 03/21/2011   Smokes with greater than 30 pack year history 03/21/2011   Edema 08/18/2010   Hyperlipidemia 03/25/2009   Coronary artery disease 03/25/2009   Primary hypertension 03/25/2009    ONSET DATE: 03/17/23  REFERRING DIAG:  R53.1 (ICD-10-CM) - Left-sided weakness  I63.9 (ICD-10-CM) - Cerebrovascular accident (CVA), unspecified mechanism (HCC)   THERAPY DIAG:  Abnormality of gait and mobility  Difficulty in walking, not elsewhere classified  Muscle weakness (generalized)  Other abnormalities of gait and mobility  Unsteadiness on feet  Rationale for Evaluation and Treatment: Rehabilitation  SUBJECTIVE:                                                                                                                                                                                             SUBJECTIVE STATEMENT:  Pt reports she is doing OK. Reports she had some soreness or "aching" in her legs after last session. Denies stumbles/falls at home. Reports she has baseline back pain, but no more pain than her baseline.   Pt and DTR report they forgot her portable O2 at home because they were in a rush to get to her therapy appointment. Pt is on 3L  supplemental O2 as needed at home.   DTR reports she feels pt has been walking better since starting therapy.   FROM EVAL: Pt presents with DTR and portable oxygen  tank, uses 2-3 L of oxygen  when using it. Pt has word finding difficulty making subjective info somewhat limited. PT provides her with time to answer to the best of her ability. Pt instructed we could use oxygen  tank provided here if needed. Pt was doing HH PT working on strength, balance, mobility generally. Pt has had no falls in last 6 months. Pt states she would like to improve her ability to move around more. Pt wants to gain independence to perform cooking activity, gardening, etc. Pt feels limited mostly due to endurance with standing.   Pt accompanied by: self and family member (DTR, Steele Edelson)   PERTINENT HISTORY: Pmx of CVA, COPD, Dysarthria, Ataxia, TIA, T2DM, Back pain, scoliosis   PAIN:  Are you having pain? Yes: NPRS scale: 3 Pain location: low back Aggravating factors: standing long time, sleeping    PRECAUTIONS: Fall  RED FLAGS: None   WEIGHT BEARING RESTRICTIONS: No  FALLS: Has patient fallen in last 6 months? No  LIVING ENVIRONMENT: Lives with: lives with their family and DTR comes by every day to assist with things  Lives in: House/apartment Stairs: Yes: External: 1 steps; none Has following equipment at home: Single point cane and Walker - 4 wheeled  PLOF: Independent with basic ADLs and Independent with household mobility with device  PATIENT GOALS: Improve mobility and function  OBJECTIVE:  Note: Objective measures were completed at Evaluation unless otherwise noted.  DIAGNOSTIC FINDINGS: MRI 03/17/23 "1. Punctate foci of restricted diffusion in the left posterior  frontal white matter, most likely acute infarcts.  2. Numerous foci of hemosiderin deposition, which are seen in the  deep gray structures but also in the bilateral cerebral  hemispheres,  with 1 new focus compared to 02/20/2023. While  this could be the  sequela of chronic hypertensive microhemorrhages, this appearance is  concerning for cerebral amyloid angiopathy."  COGNITION: Overall cognitive status: difficulty with word finding, responds appropriately    POSTURE: Rounded Shoulders   LOWER EXTREMITY ROM:     WNL for tasks assessed   LOWER EXTREMITY MMT:    MMT Right Eval Left Eval  Hip flexion 3+ 4  Hip extension    Hip abduction 4 4+  Hip adduction 4+ 5  Hip internal rotation    Hip external rotation    Knee flexion 4- 4+  Knee extension 4- 4+  Ankle dorsiflexion 4 4+  Ankle plantarflexion 4+ 5  Ankle inversion    Ankle eversion    (Blank rows = not tested)   TRANSFERS: Sit to stand: Modified independence  Assistive device utilized: None     Stand to sit: Modified independence  Assistive device utilized: None     Chair to chair: Modified independence  Assistive device utilized: None       RAMP:  Not tested  CURB:  Not tested  STAIRS: Not tested GAIT: Findings: Distance walked: 33 ft and Comments: slow speed, wide BOS, short step length  FUNCTIONAL TESTS:  5 times sit to stand: 42.28 sec SpO2 response: 94-95%, heavy UE use  Timed up and go (TUG): 24 sec  2 minute walk test: test visit 2 Berg Balance Scale: 39 : .39 m/s Patient demonstrates increased fall risk as noted by score of  39 /56 on Berg Balance Scale.  (<36= high risk for falls, close to 100%; 37-45 significant >80%; 46-51 moderate >50%; 52-55 lower >25%)     PATIENT SURVEYS:  Stroke Impact Scale 16 : 45/80                                                                                                                              TREATMENT DATE: 07/27/23   SpO2 to start session, in sitting, on room air: 94% with HR 73bpm  Therapist retrieved supplemental O2 tank and donned 3L of O2 via nasal cannula during session.  Gait training ~171ft, no AD, with CGA for steadying/safety. Pt demonstrating the following gait  deviations with therapist providing the described cuing and facilitation for improvement:  Noticed pt gets close to items on her R side in gym  Noticed wider BOS with short step lengths bilaterally Slow gait speed, which pt's DTR states is faster than pt's baseline gait speed SpO2 95-96% after   B LE functional strengthening including:  Sit<>stands from green chair using B UE support 2x 5 reps Pt reports this as at least a moderate level difficulty SpO2 94-97% during Repeated step-ups onto green step using L UE support for stability 2x 5 reps per LE CGA for safety but pt demos adequate strength to perform this safely and successfully   Gait training additional ~18ft as above; however,  with added dual-task challenge of horizontal head turns on verbal command to identify items in environment - pt reports head turns makes this a little more challenging - SpO2 95%, HR 78bpm  DTR reports prolonged standing is a great challenge for pt, such as when standing to cook.  Standing tolerance with dual-task of tapping Blaze Pods on random (2 colors - pink = RIGHT and blue = LEFT) for 72min30sec totaling 85 hits.  Pt reports she can usually tolerate standing longer than this so this intervention can be progressed at next visit  Gait training additional ~156ft to SLP office as stated above with continued noted decreased gait speed and impaired gait mechanics as well as pt having difficulty following therapist's instruction for path navigation.    PATIENT EDUCATION: Education details: POC Person educated: Patient Education method: Explanation Education comprehension: verbalized understanding   HOME EXERCISE PROGRAM: Access Code: [facility] URL: [email or web address] Date: 07/11/2023 Prepared by: Marlynn Singer  Exercises - Wide tandem with counter support as needed   - 1 x daily - 4 x weekly - 2 sets - 30 sec hold - Seated Long Arc Quad  - 1 x daily - 4 x weekly - 2 sets - 10  reps - 2 sec  hold - Seated March  - 1 x daily - 4 x weekly - 2 sets - 20 reps - Seated Heel Toe Raises  - 1 x daily - 4 x weekly - 2 sets - 15 reps - 2 sec  hold    GOALS: Goals reviewed with patient? Yes  SHORT TERM GOALS: Target date: 08/01/2023   Patient will be independent in home exercise program to improve strength/mobility for better functional independence with ADLs. Baseline: No HEP currently  Goal status: INITIAL   LONG TERM GOALS: Target date: 09/26/2023   1.  Patient (> 3 years old) will complete five times sit to stand test in < 20 seconds indicating an increased LE strength and improved balance. Baseline: 42.28 Goal status: INITIAL  2.  Patient will improve SIS 16 score to 55   to demonstrate statistically significant improvement in mobility and quality of life as it relates to their CVA.  Baseline: 45 Goal status: INITIAL   3.  Patient will increase Berg Balance score by > 6 points to demonstrate decreased fall risk during functional activities. Baseline: 39 Goal status: INITIAL   4.   Patient will reduce timed up and go to <11 seconds to reduce fall risk and demonstrate improved transfer/gait ability. Baseline: 24 sec Goal status: INITIAL  5.   Patient will increase 10 meter walk test to >.79m/s as to improve gait speed for better community ambulation and to reduce fall risk. Baseline: .38 m/s Goal status: INITIAL  6.   Patient will increase 2 minute walk test distance by 50 ft or greater for progression to community ambulator and demonstrate improved gait ability Baseline: 172 ft, O2 desats to 88% at end of test, recovers in 2 min to 97% after seated rest  Goal status: INITIAL    ASSESSMENT:  CLINICAL IMPRESSION:   Patient's SpO2 94% at rest on room air to start session; therefore, donned supplemental 3L of O2 via nasal cannula during therapy with pt's SpO2 spot checked to remain 94% or higher. Recommend continued use of 3L of supplemental O2 during  therapeutic interventions in order to allow for more improved mobility as well as improved tolerance to exercise while keeping SpO2 percentage levels within a safe range. Patient continues to  demonstrate decreased endurance with gait, requesting seated rest break after ~172ft and rest breaks after each intervention throughout session. Patient also continues to require some UE assistance during sit to stands, indicating continued lower extremity strength deficits (L>R). Patient and DTR continue to report pt has impaired standing endurance to participate in cooking tasks; therefore, pt performed 35min30sec standing task with reports of this not being a sufficient challenge, can be progressed at next visit. Pt will continue to benefit from skilled physical therapy intervention to address impairments, improve QOL, and attain therapy goals.    OBJECTIVE IMPAIRMENTS: Abnormal gait, cardiopulmonary status limiting activity, decreased activity tolerance, decreased balance, decreased endurance, decreased mobility, difficulty walking, and decreased strength.   ACTIVITY LIMITATIONS: carrying, lifting, standing, squatting, stairs, transfers, toileting, and locomotion level  PARTICIPATION LIMITATIONS: meal prep, cleaning, laundry, community activity, and yard work  PERSONAL FACTORS: Age and 3+ comorbidities: Pmx of CVA, COPD, Dysarthria, Ataxia, TIA, T2DM, Back pain, scoliosis  are also affecting patient's functional outcome.   REHAB POTENTIAL: Good  CLINICAL DECISION MAKING: Evolving/moderate complexity  EVALUATION COMPLEXITY: Moderate  PLAN:  PT FREQUENCY: 2x/week  PT DURATION: 12 weeks  PLANNED INTERVENTIONS: 97750- Physical Performance Testing, 97110-Therapeutic exercises, 97530- Therapeutic activity, 97112- Neuromuscular re-education, 97535- Self Care, 16109- Manual therapy, [phone]- Gait training, Patient/Family education, Balance training, Stair training, and Moist heat  PLAN FOR NEXT SESSION:    monitor SPO2 and use portable oxygen  to allow increased participation Endurance, balance, strength ( L>R),gait training  If performing heavy endurance training recommend using portable oxygen  and close SP02 monitoring.    Carlen Chasten, PT, DPT, NCS, CSRS Physical Therapist - [place]  [facility]  3:40 PM 07/27/23

## 2023-07-28 DIAGNOSIS — E1169 Type 2 diabetes mellitus with other specified complication: Secondary | ICD-10-CM | POA: Diagnosis not present

## 2023-07-28 DIAGNOSIS — I152 Hypertension secondary to endocrine disorders: Secondary | ICD-10-CM | POA: Diagnosis not present

## 2023-07-28 DIAGNOSIS — E1121 Type 2 diabetes mellitus with diabetic nephropathy: Secondary | ICD-10-CM | POA: Diagnosis not present

## 2023-07-28 DIAGNOSIS — E785 Hyperlipidemia, unspecified: Secondary | ICD-10-CM | POA: Diagnosis not present

## 2023-07-28 DIAGNOSIS — Z794 Long term (current) use of insulin: Secondary | ICD-10-CM | POA: Diagnosis not present

## 2023-07-28 DIAGNOSIS — E1159 Type 2 diabetes mellitus with other circulatory complications: Secondary | ICD-10-CM | POA: Diagnosis not present

## 2023-07-31 NOTE — Therapy (Incomplete)
 OUTPATIENT PHYSICAL THERAPY NEURO TREATMENT   Patient Name: Hannah Kirby MRN: [id number] DOB:[birth year], 78 y.o., female Today's Date: 07/31/2023   PCP:   Lamon Pillow, MD   REFERRING PROVIDER:    Morey Ar, NP    END OF SESSION:      Past Medical History:  Diagnosis Date   Acute hemorrhagic colitis 08/08/2019   Anemia    Anxiety    Arthritis    C. difficile colitis 08/09/2019   CAD (coronary artery disease)    a. 02/2006 PCI: BMS x 2 to RCA, cath o/w without significant coronary disease; b. nuclear stress test 07/2014: No ischemia/infarct; c. 11/2017 MV: no isch/infarct, EF 55-65%; d. 03/2020 NSTEMI/PCI: LM nl, LAD min irregs, RI 25, small, LCX nl, RCA 30p/m ISR, 99d (3.0x15 Resolute Onyx DES).   Cerebrovascular accident (CVA) (HCC) 03/18/2023   dysarthria   Chronic bronchitis (HCC)    secondary to cigarette smoking   Chronic kidney disease (CKD), stage III (moderate) (HCC)    COPD (chronic obstructive pulmonary disease) (HCC)    Diabetes mellitus    Diastolic dysfunction    a. 07/2019 Echo: EF 60-65%, no rwma, mod LVH. Nl RV size/fxn; b. 03/2020 Echo: EF 60-65%, mod LVH, gr1 DD, nl RV size/fxn, mild AS.   FHx: allergies    GERD (gastroesophageal reflux disease)    Goiter    Granulomatous disease (HCC)    Hernia    Hyperlipidemia    Hypertension    Kidney stone on left side 2013   Microalbuminuria    NSTEMI (non-ST elevated myocardial infarction) (HCC) 03/23/2020   Obesity    Panic attacks    PVC's (premature ventricular contractions)    a. 03/2018 Zio: Occas PVCs (2.5%). Triggered events assoc w/ PVC/PAC.   Retinal tear 2020   Smokers' cough (HCC)    Spinal stenosis    Stroke (HCC) 10/29/2016   mild left side weakness   Stroke (HCC) 08/01/2019   Tobacco abuse    Past Surgical History:  Procedure Laterality Date   ABDOMINAL HYSTERECTOMY     BREAST SURGERY     CATARACT EXTRACTION W/PHACO Right 03/01/2017   Procedure: CATARACT EXTRACTION  PHACO AND INTRAOCULAR LENS PLACEMENT (IOC) RIGHT DIABETIC;  Surgeon: Annell Kidney, MD;  Location: [facility];  Service: Ophthalmology;  Laterality: Right;   CATARACT EXTRACTION W/PHACO Left 03/22/2017   Procedure: CATARACT EXTRACTION PHACO AND INTRAOCULAR LENS PLACEMENT (IOC) LEFT DIABETIC;  Surgeon: Annell Kidney, MD;  Location: [facility];  Service: Ophthalmology;  Laterality: Left;  Diabetic - insulin  and oral meds   COLONOSCOPY WITH PROPOFOL  N/A 09/23/2014   Procedure: COLONOSCOPY WITH PROPOFOL ;  Surgeon: Deveron Fly, MD;  Location: [facility] ENDOSCOPY;  Service: Endoscopy;  Laterality: N/A;   COLONOSCOPY WITH PROPOFOL  N/A 01/11/2018   Procedure: COLONOSCOPY WITH PROPOFOL ;  Surgeon: Deveron Fly, MD;  Location: [facility] ENDOSCOPY;  Service: Endoscopy;  Laterality: N/A;   COLONOSCOPY WITH PROPOFOL  N/A 04/24/2018   Procedure: COLONOSCOPY WITH PROPOFOL ;  Surgeon: Deveron Fly, MD;  Location: [facility] ENDOSCOPY;  Service: Endoscopy;  Laterality: N/A;   COLONOSCOPY WITH PROPOFOL  N/A 11/19/2019   Procedure: COLONOSCOPY WITH PROPOFOL ;  Surgeon: Marnee Sink, MD;  Location: [facility] ENDOSCOPY;  Service: Endoscopy;  Laterality: N/A;   CORONARY ANGIOPLASTY WITH STENT PLACEMENT  2008   CORONARY STENT INTERVENTION N/A 03/24/2020   Procedure: CORONARY STENT INTERVENTION;  Surgeon: Sammy Crisp, MD;  Location: [facility] INVASIVE CV LAB;  Service: Cardiovascular;  Laterality: N/A;   ESOPHAGOGASTRODUODENOSCOPY (EGD) WITH  PROPOFOL  N/A 12/30/2014   Procedure: ESOPHAGOGASTRODUODENOSCOPY (EGD) WITH PROPOFOL ;  Surgeon: Deveron Fly, MD;  Location: [facility] ENDOSCOPY;  Service: Endoscopy;  Laterality: N/A;   ESOPHAGOGASTRODUODENOSCOPY (EGD) WITH PROPOFOL  N/A 07/19/2016   Procedure: ESOPHAGOGASTRODUODENOSCOPY (EGD) WITH PROPOFOL ;  Surgeon: Deveron Fly, MD;  Location: [facility] ENDOSCOPY;  Service: Endoscopy;  Laterality: N/A;   ESOPHAGOGASTRODUODENOSCOPY (EGD) WITH PROPOFOL  N/A 04/24/2018    Procedure: ESOPHAGOGASTRODUODENOSCOPY (EGD) WITH PROPOFOL ;  Surgeon: Deveron Fly, MD;  Location: [facility] ENDOSCOPY;  Service: Endoscopy;  Laterality: N/A;   hysterectomy (other)     LEFT HEART CATH AND CORONARY ANGIOGRAPHY N/A 03/24/2020   Procedure: LEFT HEART CATH AND CORONARY ANGIOGRAPHY;  Surgeon: Sammy Crisp, MD;  Location: [facility] INVASIVE CV LAB;  Service: Cardiovascular;  Laterality: N/A;   Patient Active Problem List   Diagnosis Date Noted   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) 03/27/2023   Dysarthria 03/17/2023   Chronic obstructive pulmonary disease (HCC) 03/17/2023   Dizziness 02/23/2023   Dehydration 02/20/2023   Restrictive airway disease 07/21/2020   Partial thickness rotator cuff tear 03/20/2020   Shoulder pain 03/05/2020   Retinal detachment, left 03/04/2020   Ataxia 02/28/2020   Difficulty walking 02/28/2020   Physical deconditioning 02/28/2020   Chronic diastolic congestive heart failure (HCC) 11/20/2019   Polyp of ascending colon    Hypokalemia 08/08/2019   TIA (transient ischemic attack) 08/01/2019   Benign hypertensive kidney disease with chronic kidney disease 04/25/2019   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   Chronic, continuous use of opioids 03/26/2018   History of adenomatous polyp of colon 01/22/2018   Ulceration of colon determined by endoscopy 01/22/2018   Diverticulosis of colon 01/22/2018   History of CVA (cerebrovascular accident) 11/08/2016   Weakness of left upper extremity 10/30/2016   AAA (abdominal aortic aneurysm) without rupture (HCC) 08/12/2016   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   Stage 3b chronic kidney disease (HCC) 07/27/2015   Chest pain at rest 05/06/2015   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HCC) 10/28/2014   Solitary pulmonary nodule on lung CT 08/20/2014   Allergic rhinitis  07/28/2014   CAFL (chronic airflow limitation) (HCC) 07/28/2014   Acid reflux 07/28/2014   Granuloma annulare    Back pain 11/12/2013   Lumbar scoliosis 10/30/2013   Lumbar radiculopathy 10/30/2013   Lumbar canal stenosis 10/30/2013   Calcium  blood increased 10/22/2013   Proteinuria 10/22/2013   Obesity 03/21/2011   Smokes with greater than 30 pack year history 03/21/2011   Edema 08/18/2010   Hyperlipidemia 03/25/2009   Coronary artery disease 03/25/2009   Primary hypertension 03/25/2009    ONSET DATE: 03/17/23  REFERRING DIAG:  R53.1 (ICD-10-CM) - Left-sided weakness  I63.9 (ICD-10-CM) - Cerebrovascular accident (CVA), unspecified mechanism (HCC)   THERAPY DIAG:  No diagnosis found.  Rationale for Evaluation and Treatment: Rehabilitation  SUBJECTIVE:  SUBJECTIVE STATEMENT: ***   FROM EVAL: Pt presents with DTR and portable oxygen  tank, uses 2-3 L of oxygen  when using it. Pt has word finding difficulty making subjective info somewhat limited. PT provides her with time to answer to the best of her ability. Pt instructed we could use oxygen  tank provided here if needed. Pt was doing HH PT working on strength, balance, mobility generally. Pt has had no falls in last 6 months. Pt states she would like to improve her ability to move around more. Pt wants to gain independence to perform cooking activity, gardening, etc. Pt feels limited mostly due to endurance with standing.   Pt accompanied by: self and family member (DTR, Steele Edelson)   PERTINENT HISTORY: Pmx of CVA, COPD, Dysarthria, Ataxia, TIA, T2DM, Back pain, scoliosis   PAIN:  Are you having pain? Yes: NPRS scale: 3 Pain location: low back Aggravating factors: standing long time, sleeping    PRECAUTIONS: Fall  RED FLAGS: None   WEIGHT  BEARING RESTRICTIONS: No  FALLS: Has patient fallen in last 6 months? No  LIVING ENVIRONMENT: Lives with: lives with their family and DTR comes by every day to assist with things  Lives in: House/apartment Stairs: Yes: External: 1 steps; none Has following equipment at home: Single point cane and Walker - 4 wheeled  PLOF: Independent with basic ADLs and Independent with household mobility with device  PATIENT GOALS: Improve mobility and function  OBJECTIVE:  Note: Objective measures were completed at Evaluation unless otherwise noted.  DIAGNOSTIC FINDINGS: MRI 03/17/23 "1. Punctate foci of restricted diffusion in the left posterior  frontal white matter, most likely acute infarcts.  2. Numerous foci of hemosiderin deposition, which are seen in the  deep gray structures but also in the bilateral cerebral hemispheres,  with 1 new focus compared to 02/20/2023. While this could be the  sequela of chronic hypertensive microhemorrhages, this appearance is  concerning for cerebral amyloid angiopathy."  COGNITION: Overall cognitive status: difficulty with word finding, responds appropriately    POSTURE: Rounded Shoulders   LOWER EXTREMITY ROM:     WNL for tasks assessed   LOWER EXTREMITY MMT:    MMT Right Eval Left Eval  Hip flexion 3+ 4  Hip extension    Hip abduction 4 4+  Hip adduction 4+ 5  Hip internal rotation    Hip external rotation    Knee flexion 4- 4+  Knee extension 4- 4+  Ankle dorsiflexion 4 4+  Ankle plantarflexion 4+ 5  Ankle inversion    Ankle eversion    (Blank rows = not tested)   TRANSFERS: Sit to stand: Modified independence  Assistive device utilized: None     Stand to sit: Modified independence  Assistive device utilized: None     Chair to chair: Modified independence  Assistive device utilized: None       RAMP:  Not tested  CURB:  Not tested  STAIRS: Not tested GAIT: Findings: Distance walked: 33 ft and Comments: slow speed, wide BOS,  short step length  FUNCTIONAL TESTS:  5 times sit to stand: 42.28 sec SpO2 response: 94-95%, heavy UE use  Timed up and go (TUG): 24 sec  2 minute walk test: test visit 2 Berg Balance Scale: 39 : .39 m/s Patient demonstrates increased fall risk as noted by score of  39 /56 on Berg Balance Scale.  (<36= high risk for falls, close to 100%; 37-45 significant >80%; 46-51 moderate >50%; 52-55 lower >25%)     PATIENT  SURVEYS:  Stroke Impact Scale 16 : 45/80                                                                                                                              TREATMENT DATE: 07/31/23   SpO2 to start session, in sitting, on room air: 94% with HR 73bpm  Therapist retrieved supplemental O2 tank and donned 3L of O2 via nasal cannula during session.  Gait training ~173ft, no AD, with CGA for steadying/safety. Pt demonstrating the following gait deviations with therapist providing the described cuing and facilitation for improvement:  Noticed pt gets close to items on her R side in gym  Noticed wider BOS with short step lengths bilaterally Slow gait speed, which pt's DTR states is faster than pt's baseline gait speed SpO2 95-96% after   B LE functional strengthening including:  Sit<>stands from green chair using B UE support 2x 5 reps Pt reports this as at least a moderate level difficulty SpO2 94-97% during Repeated step-ups onto green step using L UE support for stability 2x 5 reps per LE CGA for safety but pt demos adequate strength to perform this safely and successfully   Gait training additional ~127ft as above; however, with added dual-task challenge of horizontal head turns on verbal command to identify items in environment - pt reports head turns makes this a little more challenging - SpO2 95%, HR 78bpm  DTR reports prolonged standing is a great challenge for pt, such as when standing to cook.  Standing tolerance with dual-task of tapping Blaze Pods  on random (2 colors - pink = RIGHT and blue = LEFT) for 59min30sec totaling 85 hits.  Pt reports she can usually tolerate standing longer than this so this intervention can be progressed at next visit  Gait training additional ~150ft to SLP office as stated above with continued noted decreased gait speed and impaired gait mechanics as well as pt having difficulty following therapist's instruction for path navigation.    PATIENT EDUCATION: Education details: POC Person educated: Patient Education method: Explanation Education comprehension: verbalized understanding   HOME EXERCISE PROGRAM: Access Code: [facility] URL: [email or web address] Date: 07/11/2023 Prepared by: Marlynn Singer  Exercises - Wide tandem with counter support as needed   - 1 x daily - 4 x weekly - 2 sets - 30 sec hold - Seated Long Arc Quad  - 1 x daily - 4 x weekly - 2 sets - 10 reps - 2 sec  hold - Seated March  - 1 x daily - 4 x weekly - 2 sets - 20 reps - Seated Heel Toe Raises  - 1 x daily - 4 x weekly - 2 sets - 15 reps - 2 sec  hold    GOALS: Goals reviewed with patient? Yes  SHORT TERM GOALS: Target date: 08/01/2023   Patient will be independent in home exercise program to improve strength/mobility for better functional independence with ADLs. Baseline: No HEP currently  Goal status: INITIAL   LONG TERM GOALS: Target date: 09/26/2023   1.  Patient (> 59 years old) will complete five times sit to stand test in < 20 seconds indicating an increased LE strength and improved balance. Baseline: 42.28 Goal status: INITIAL  2.  Patient will improve SIS 16 score to 55   to demonstrate statistically significant improvement in mobility and quality of life as it relates to their CVA.  Baseline: 45 Goal status: INITIAL   3.  Patient will increase Berg Balance score by > 6 points to demonstrate decreased fall risk during functional activities. Baseline: 39 Goal status: INITIAL   4.   Patient  will reduce timed up and go to <11 seconds to reduce fall risk and demonstrate improved transfer/gait ability. Baseline: 24 sec Goal status: INITIAL  5.   Patient will increase 10 meter walk test to >.29m/s as to improve gait speed for better community ambulation and to reduce fall risk. Baseline: .38 m/s Goal status: INITIAL  6.   Patient will increase 2 minute walk test distance by 50 ft or greater for progression to community ambulator and demonstrate improved gait ability Baseline: 172 ft, O2 desats to 88% at end of test, recovers in 2 min to 97% after seated rest  Goal status: INITIAL    ASSESSMENT:  CLINICAL IMPRESSION:   *** Pt will continue to benefit from skilled physical therapy intervention to address impairments, improve QOL, and attain therapy goals.    OBJECTIVE IMPAIRMENTS: Abnormal gait, cardiopulmonary status limiting activity, decreased activity tolerance, decreased balance, decreased endurance, decreased mobility, difficulty walking, and decreased strength.   ACTIVITY LIMITATIONS: carrying, lifting, standing, squatting, stairs, transfers, toileting, and locomotion level  PARTICIPATION LIMITATIONS: meal prep, cleaning, laundry, community activity, and yard work  PERSONAL FACTORS: Age and 3+ comorbidities: Pmx of CVA, COPD, Dysarthria, Ataxia, TIA, T2DM, Back pain, scoliosis  are also affecting patient's functional outcome.   REHAB POTENTIAL: Good  CLINICAL DECISION MAKING: Evolving/moderate complexity  EVALUATION COMPLEXITY: Moderate  PLAN:  PT FREQUENCY: 2x/week  PT DURATION: 12 weeks  PLANNED INTERVENTIONS: 97750- Physical Performance Testing, 97110-Therapeutic exercises, 97530- Therapeutic activity, 97112- Neuromuscular re-education, 97535- Self Care, [id number]- Manual therapy, [phone]- Gait training, Patient/Family education, Balance training, Stair training, and Moist heat  PLAN FOR NEXT SESSION:   monitor SPO2 and use portable oxygen  to allow increased  participation Endurance, balance, strength ( L>R),gait training  If performing heavy endurance training recommend using portable oxygen  and close SP02 monitoring.    Geneive Sandstrom  Brain Cahill PT, DPT Physical Therapist - [facility]  Outpatient Physical Therapy- Main Campus [phone]    3:51 PM 07/31/23

## 2023-08-01 ENCOUNTER — Ambulatory Visit

## 2023-08-03 ENCOUNTER — Ambulatory Visit

## 2023-08-03 DIAGNOSIS — R269 Unspecified abnormalities of gait and mobility: Secondary | ICD-10-CM | POA: Diagnosis not present

## 2023-08-03 DIAGNOSIS — R471 Dysarthria and anarthria: Secondary | ICD-10-CM | POA: Diagnosis not present

## 2023-08-03 DIAGNOSIS — I69890 Apraxia following other cerebrovascular disease: Secondary | ICD-10-CM

## 2023-08-03 DIAGNOSIS — R4701 Aphasia: Secondary | ICD-10-CM | POA: Diagnosis not present

## 2023-08-03 DIAGNOSIS — M6281 Muscle weakness (generalized): Secondary | ICD-10-CM | POA: Diagnosis not present

## 2023-08-03 DIAGNOSIS — R262 Difficulty in walking, not elsewhere classified: Secondary | ICD-10-CM | POA: Diagnosis not present

## 2023-08-03 NOTE — Therapy (Signed)
 OUTPATIENT SPEECH LANGUAGE PATHOLOGY APHASIA TREATMENT   Patient Name: Hannah Kirby MRN: [id number] DOB:1945/04/09, 78 y.o., female Today's Date: 07/04/2023  PCP: Jeralene Mom, MD REFERRING PROVIDER: Jeralene Mom, MD     End of Session - 07/04/23 1453     Visit Number 7    Number of Visits 24    Date for SLP Re-Evaluation 08/30/23    SLP Start Time 1400    SLP Stop Time  1445    SLP Time Calculation (min) 45 min    Activity Tolerance Patient tolerated treatment well   Simultaneous filing. User may not have seen previous data.            Past Medical History:  Diagnosis Date   Acute hemorrhagic colitis 08/08/2019   Anemia    Anxiety    Arthritis    C. difficile colitis 08/09/2019   CAD (coronary artery disease)    a. 02/2006 PCI: BMS x 2 to RCA, cath o/w without significant coronary disease; b. nuclear stress test 07/2014: No ischemia/infarct; c. 11/2017 MV: no isch/infarct, EF 55-65%; d. 03/2020 NSTEMI/PCI: LM nl, LAD min irregs, RI 25, small, LCX nl, RCA 30p/m ISR, 99d (3.0x15 Resolute Onyx DES).   Cerebrovascular accident (CVA) (HCC) 03/18/2023   dysarthria   Chronic bronchitis (HCC)    secondary to cigarette smoking   Chronic kidney disease (CKD), stage III (moderate) (HCC)    COPD (chronic obstructive pulmonary disease) (HCC)    Diabetes mellitus    Diastolic dysfunction    a. 07/2019 Echo: EF 60-65%, no rwma, mod LVH. Nl RV size/fxn; b. 03/2020 Echo: EF 60-65%, mod LVH, gr1 DD, nl RV size/fxn, mild AS.   FHx: allergies    GERD (gastroesophageal reflux disease)    Goiter    Granulomatous disease (HCC)    Hernia    Hyperlipidemia    Hypertension    Kidney stone on left side 2013   Microalbuminuria    NSTEMI (non-ST elevated myocardial infarction) (HCC) 03/23/2020   Obesity    Panic attacks    PVC's (premature ventricular contractions)    a. 03/2018 Zio: Occas PVCs (2.5%). Triggered events assoc w/ PVC/PAC.   Retinal tear 2020   Smokers' cough (HCC)     Spinal stenosis    Stroke (HCC) 10/29/2016   mild left side weakness   Stroke (HCC) 08/01/2019   Tobacco abuse    Past Surgical History:  Procedure Laterality Date   ABDOMINAL HYSTERECTOMY     BREAST SURGERY     CATARACT EXTRACTION W/PHACO Right 03/01/2017   Procedure: CATARACT EXTRACTION PHACO AND INTRAOCULAR LENS PLACEMENT (IOC) RIGHT DIABETIC;  Surgeon: Annell Kidney, MD;  Location: [facility];  Service: Ophthalmology;  Laterality: Right;   CATARACT EXTRACTION W/PHACO Left 03/22/2017   Procedure: CATARACT EXTRACTION PHACO AND INTRAOCULAR LENS PLACEMENT (IOC) LEFT DIABETIC;  Surgeon: Annell Kidney, MD;  Location: [facility];  Service: Ophthalmology;  Laterality: Left;  Diabetic - insulin  and oral meds   COLONOSCOPY WITH PROPOFOL  N/A 09/23/2014   Procedure: COLONOSCOPY WITH PROPOFOL ;  Surgeon: Deveron Fly, MD;  Location: [facility] ENDOSCOPY;  Service: Endoscopy;  Laterality: N/A;   COLONOSCOPY WITH PROPOFOL  N/A 01/11/2018   Procedure: COLONOSCOPY WITH PROPOFOL ;  Surgeon: Deveron Fly, MD;  Location: [facility] ENDOSCOPY;  Service: Endoscopy;  Laterality: N/A;   COLONOSCOPY WITH PROPOFOL  N/A 04/24/2018   Procedure: COLONOSCOPY WITH PROPOFOL ;  Surgeon: Deveron Fly, MD;  Location: [facility] ENDOSCOPY;  Service: Endoscopy;  Laterality: N/A;   COLONOSCOPY WITH  PROPOFOL  N/A 11/19/2019   Procedure: COLONOSCOPY WITH PROPOFOL ;  Surgeon: Marnee Sink, MD;  Location: [facility] ENDOSCOPY;  Service: Endoscopy;  Laterality: N/A;   CORONARY ANGIOPLASTY WITH STENT PLACEMENT  2008   CORONARY STENT INTERVENTION N/A 03/24/2020   Procedure: CORONARY STENT INTERVENTION;  Surgeon: Sammy Crisp, MD;  Location: [facility] INVASIVE CV LAB;  Service: Cardiovascular;  Laterality: N/A;   ESOPHAGOGASTRODUODENOSCOPY (EGD) WITH PROPOFOL  N/A 12/30/2014   Procedure: ESOPHAGOGASTRODUODENOSCOPY (EGD) WITH PROPOFOL ;  Surgeon: Deveron Fly, MD;  Location: [facility] ENDOSCOPY;  Service: Endoscopy;  Laterality:  N/A;   ESOPHAGOGASTRODUODENOSCOPY (EGD) WITH PROPOFOL  N/A 07/19/2016   Procedure: ESOPHAGOGASTRODUODENOSCOPY (EGD) WITH PROPOFOL ;  Surgeon: Deveron Fly, MD;  Location: [facility] ENDOSCOPY;  Service: Endoscopy;  Laterality: N/A;   ESOPHAGOGASTRODUODENOSCOPY (EGD) WITH PROPOFOL  N/A 04/24/2018   Procedure: ESOPHAGOGASTRODUODENOSCOPY (EGD) WITH PROPOFOL ;  Surgeon: Deveron Fly, MD;  Location: [facility] ENDOSCOPY;  Service: Endoscopy;  Laterality: N/A;   hysterectomy (other)     LEFT HEART CATH AND CORONARY ANGIOGRAPHY N/A 03/24/2020   Procedure: LEFT HEART CATH AND CORONARY ANGIOGRAPHY;  Surgeon: Sammy Crisp, MD;  Location: [facility] INVASIVE CV LAB;  Service: Cardiovascular;  Laterality: N/A;   Patient Active Problem List   Diagnosis Date Noted   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) 03/27/2023   Dysarthria 03/17/2023   Chronic obstructive pulmonary disease (HCC) 03/17/2023   Dizziness 02/23/2023   Dehydration 02/20/2023   Restrictive airway disease 07/21/2020   Partial thickness rotator cuff tear 03/20/2020   Shoulder pain 03/05/2020   Retinal detachment, left 03/04/2020   Ataxia 02/28/2020   Difficulty walking 02/28/2020   Physical deconditioning 02/28/2020   Chronic diastolic congestive heart failure (HCC) 11/20/2019   Polyp of ascending colon    Hypokalemia 08/08/2019   TIA (transient ischemic attack) 08/01/2019   Benign hypertensive kidney disease with chronic kidney disease 04/25/2019   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   Chronic, continuous use of opioids 03/26/2018   History of adenomatous polyp of colon 01/22/2018   Ulceration of colon determined by endoscopy 01/22/2018   Diverticulosis of colon 01/22/2018   History of CVA (cerebrovascular accident) 11/08/2016   Weakness of left upper extremity 10/30/2016   AAA (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) 08/12/2016   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   Stage 3b chronic kidney disease (HCC) 07/27/2015   Chest pain at rest 05/06/2015   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HCC) 10/28/2014   Solitary pulmonary nodule on lung CT 08/20/2014   Allergic rhinitis 07/28/2014   CAFL (chronic airflow limitation) (HCC) 07/28/2014   Acid reflux 07/28/2014   Granuloma annulare    Back pain 11/12/2013   Lumbar scoliosis 10/30/2013   Lumbar radiculopathy 10/30/2013   Lumbar canal stenosis 10/30/2013   Calcium  blood increased 10/22/2013   Proteinuria 10/22/2013   Obesity 03/21/2011   Smokes with greater than 30 pack year history 03/21/2011   Edema 08/18/2010   Hyperlipidemia 03/25/2009   Coronary artery disease 03/25/2009   Primary hypertension 03/25/2009    ONSET DATE: 03/17/23 (admitted with stroke), 05/23/23 (referral date)  REFERRING DIAG:  R41.841 (ICD-10-CM) - Cognitive communication deficit  I63.9 (ICD-10-CM) - CVA (cerebral vascular accident) (HCC)  R47.02 (ICD-10-CM) - Dysphasia    THERAPY DIAG:  Aphasia  Apraxia following other cerebrovascular disease  Rationale for Evaluation and Treatment Rehabilitation  SUBJECTIVE:   SUBJECTIVE STATEMENT: Pt alert, pleasant, and cooperative. On home O2.  Pt accompanied  by: self, family member  PERTINENT HISTORY:  Pt is a 78 y.o. female who presents for communication evaluation in setting on stroke. Pt in ED 1/24-1/25/25. Pt d/c'd home with HH ST. PMHx significant of   HTN, CKD stage IIIB, DMT2 on insulin , CAD, chronic smoking, chronic respiratory failure with COPD and on home oxygen , anxiety, HLD. MRI 03/17/23 1. Punctate foci of restricted diffusion in the left posterior  frontal white matter, most likely acute infarcts.  2. Numerous foci of hemosiderin deposition, which are seen in the  deep gray structures but also in the bilateral cerebral hemispheres,  with 1 new focus compared to 02/20/2023. While  this could be the  sequela of chronic hypertensive microhemorrhages, this appearance is  concerning for cerebral amyloid angiopathy.   PAIN:  Are you having pain? No  FALLS: defer to PT; under care of HH PT  LIVING ENVIRONMENT: Lives with: lives with their spouse Lives in: House/apartment  PLOF:  Level of assistance: Independent with ADLs Employment: Retired   PATIENT GOALS    for communication to improve   OBJECTIVE:  TODAY'S TREATMENT:  SGD programming/modification: Pt received SGD loaner device . Reviewed orientation to device completed (e.g. power, volume, wifi, settings) as well as discussion re: communication modes, therapy tab, community/group programming, and customization.  Pt able to select appropriate card/folder for several questions; however, assistance needed for navigating device/tabs. Additional time spent on type and draw tabs for quicker repair of communication breakdowns.  Speech tx: Pt answered biographical questions with 100% accuracy with use of total communication including SGD. Pt completed responsive naming task with 100% accuracy with indep repetition to repair speech sound sequencing errors. Pt then put responses in sentences with 60% accuracy indep, 100% with min-mod verbal cues.    PATIENT EDUCATION: Education details: as above Person educated: Patient and Child(ren) Education method: Explanation Education comprehension: verbalized understanding and needs further education  HOME EXERCISE PROGRAM:   TalkPath Therapy - on computer - if able to access  Worksheets flagged in workbooks from home  Practice SFA, supported communication   GOALS:  Goals reviewed with patient? Yes  SHORT TERM GOALS: Target date: 10 sessions  Pt will participate in further assessment of functional reading/writing. Baseline: Goal status: MET  2.  With Moderate A, patient will complete a semantic feature analysis with at least 2 relevant features for 8/10 target  words to improve word-finding skills.  Baseline:  Goal status: MET  3.  With Maximal A, patient will generate sentences with 3 or more words in response to a situation at 80% accuracy in order to increase ability to communicate basic wants and needs.  Baseline:  Goal status: MET  4.   With Maximal A,  pt will follow 2-step commands for improved participation in ADLs/iADLs with max cueing. Baseline:  Goal status:MET  5.  Pt will repeat short sentences (<4 words) with good approximations 80% of the time with max cueing. Baseline:  Goal status: MET    LONG TERM GOALS: Target date: 12 weeks  Pt will report a subjective improvement in communication per PROM.  Baseline: CES 15/32 on 06/07/23 Goal status: PROGRESSING  2.  With Min A, patient/family will demonstrate understanding of the following concepts: aphasia, spontaneous recovery, communication vs conversation, strengths/strategies to promote success, local resources in order to increase patient's participation in medical care.    Baseline:  Goal status: PROGRESSING   ASSESSMENT:  CLINICAL IMPRESSION: Pt is a 78 y.o. female who presents for communication evaluation in  setting on stroke. Pt in ED 1/24-1/25/25 for stroke. Pt d/c'd home with HH ST. PMHx significant of  HTN, CKD stage IIIB, DMT2 on insulin , CAD, chronic smoking, chronic respiratory failure with COPD and on home oxygen , anxiety, HLD. Assessment completed via functional/dynamic means including selected subtests of Western Aphasia Battery Revised (WAB-R) and PROM (Communication Effectiveness Scale). 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 Pt with halting, telegraphic speech, mostly single words, paraphasias, neologisms, severe wordfinding difficulty. Similar difficulty noted with confrontation naming and divergent naming. Auditory comprehension is a relatively strength for pt as pt with intact single word recognition and ability to answer  simple and complex yes/no questions. Increased difficulty noted with pt's ability to follow 2-step (moderately complex and complex) commands. Pt with impaired repetition at the word, phase, and sentence level. 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 SGD evaluation completed 06/27/23 with SGD trial recommending. See details of assessment and tx session above. Recommend course of ST targeting functional communication, further assessment of functional reading/writing, and pt/caregiver training to help promote QoL and overall life participation.   OBJECTIVE IMPAIRMENTS include expressive language, receptive language, and aphasia. These impairments are limiting patient from ADLs/IADLs and effectively communicating at home and in community. Factors affecting potential to achieve goals and functional outcome are severity of impairments. Patient will benefit from skilled SLP services to address above impairments and improve overall function.  REHAB POTENTIAL: Good  PLAN: SLP FREQUENCY: 2x/week  SLP DURATION: 12 weeks  PLANNED INTERVENTIONS: Language facilitation, Cueing hierachy, Internal/external aids, Functional tasks, Multimodal communication approach, SLP instruction and feedback, Compensatory strategies, and Patient/family education    Dia Forget, M.S., CCC-SLP Speech-Language Pathologist [facility] [phone] [place])  [facility] Outpatient Rehabilitation at [facility] [address] [place], [place], [address] Phone: [phone]   Fax:  [phone]

## 2023-08-05 ENCOUNTER — Emergency Department

## 2023-08-05 ENCOUNTER — Encounter: Payer: Self-pay | Admitting: Emergency Medicine

## 2023-08-05 ENCOUNTER — Observation Stay
Admission: EM | Admit: 2023-08-05 | Discharge: 2023-08-07 | Disposition: A | Discharge status: Home / Self Care | Attending: Family Medicine | Admitting: Family Medicine

## 2023-08-05 ENCOUNTER — Other Ambulatory Visit: Payer: Self-pay

## 2023-08-05 DIAGNOSIS — I700 Atherosclerosis of aorta: Secondary | ICD-10-CM | POA: Diagnosis not present

## 2023-08-05 DIAGNOSIS — I5032 Chronic diastolic (congestive) heart failure: Secondary | ICD-10-CM | POA: Diagnosis not present

## 2023-08-05 DIAGNOSIS — E663 Overweight: Secondary | ICD-10-CM | POA: Insufficient documentation

## 2023-08-05 DIAGNOSIS — F418 Other specified anxiety disorders: Secondary | ICD-10-CM | POA: Diagnosis not present

## 2023-08-05 DIAGNOSIS — Z6825 Body mass index (BMI) 25.0-25.9, adult: Secondary | ICD-10-CM | POA: Insufficient documentation

## 2023-08-05 DIAGNOSIS — J449 Chronic obstructive pulmonary disease, unspecified: Secondary | ICD-10-CM | POA: Insufficient documentation

## 2023-08-05 DIAGNOSIS — Z79899 Other long term (current) drug therapy: Secondary | ICD-10-CM | POA: Diagnosis not present

## 2023-08-05 DIAGNOSIS — I2510 Atherosclerotic heart disease of native coronary artery without angina pectoris: Secondary | ICD-10-CM | POA: Diagnosis present

## 2023-08-05 DIAGNOSIS — R479 Unspecified speech disturbances: Secondary | ICD-10-CM | POA: Diagnosis not present

## 2023-08-05 DIAGNOSIS — F1090 Alcohol use, unspecified, uncomplicated: Secondary | ICD-10-CM | POA: Insufficient documentation

## 2023-08-05 DIAGNOSIS — I639 Cerebral infarction, unspecified: Principal | ICD-10-CM | POA: Diagnosis present

## 2023-08-05 DIAGNOSIS — F419 Anxiety disorder, unspecified: Secondary | ICD-10-CM | POA: Insufficient documentation

## 2023-08-05 DIAGNOSIS — I63542 Cerebral infarction due to unspecified occlusion or stenosis of left cerebellar artery: Principal | ICD-10-CM | POA: Insufficient documentation

## 2023-08-05 DIAGNOSIS — Z720 Tobacco use: Secondary | ICD-10-CM | POA: Diagnosis not present

## 2023-08-05 DIAGNOSIS — Z794 Long term (current) use of insulin: Secondary | ICD-10-CM | POA: Diagnosis not present

## 2023-08-05 DIAGNOSIS — R9082 White matter disease, unspecified: Secondary | ICD-10-CM | POA: Diagnosis not present

## 2023-08-05 DIAGNOSIS — E1122 Type 2 diabetes mellitus with diabetic chronic kidney disease: Secondary | ICD-10-CM | POA: Diagnosis not present

## 2023-08-05 DIAGNOSIS — R531 Weakness: Secondary | ICD-10-CM | POA: Diagnosis not present

## 2023-08-05 DIAGNOSIS — R0602 Shortness of breath: Secondary | ICD-10-CM | POA: Diagnosis not present

## 2023-08-05 DIAGNOSIS — J9611 Chronic respiratory failure with hypoxia: Secondary | ICD-10-CM | POA: Diagnosis not present

## 2023-08-05 DIAGNOSIS — I10 Essential (primary) hypertension: Secondary | ICD-10-CM | POA: Diagnosis not present

## 2023-08-05 DIAGNOSIS — E785 Hyperlipidemia, unspecified: Secondary | ICD-10-CM | POA: Diagnosis present

## 2023-08-05 DIAGNOSIS — I671 Cerebral aneurysm, nonruptured: Secondary | ICD-10-CM | POA: Diagnosis not present

## 2023-08-05 DIAGNOSIS — F32A Depression, unspecified: Secondary | ICD-10-CM | POA: Insufficient documentation

## 2023-08-05 DIAGNOSIS — Z7901 Long term (current) use of anticoagulants: Secondary | ICD-10-CM | POA: Diagnosis not present

## 2023-08-05 DIAGNOSIS — I6782 Cerebral ischemia: Secondary | ICD-10-CM | POA: Diagnosis not present

## 2023-08-05 DIAGNOSIS — I130 Hypertensive heart and chronic kidney disease with heart failure and stage 1 through stage 4 chronic kidney disease, or unspecified chronic kidney disease: Secondary | ICD-10-CM | POA: Diagnosis not present

## 2023-08-05 DIAGNOSIS — F17210 Nicotine dependence, cigarettes, uncomplicated: Secondary | ICD-10-CM | POA: Diagnosis present

## 2023-08-05 DIAGNOSIS — N1832 Chronic kidney disease, stage 3b: Secondary | ICD-10-CM | POA: Diagnosis present

## 2023-08-05 DIAGNOSIS — Z1152 Encounter for screening for COVID-19: Secondary | ICD-10-CM | POA: Insufficient documentation

## 2023-08-05 DIAGNOSIS — I63233 Cerebral infarction due to unspecified occlusion or stenosis of bilateral carotid arteries: Secondary | ICD-10-CM | POA: Diagnosis not present

## 2023-08-05 LAB — CBC WITH DIFFERENTIAL/PLATELET
Abs Immature Granulocytes: 0.03 10*3/uL (ref 0.00–0.07)
Basophils Absolute: 0.1 10*3/uL (ref 0.0–0.1)
Basophils Relative: 1 %
Eosinophils Absolute: 0.3 10*3/uL (ref 0.0–0.5)
Eosinophils Relative: 5 %
HCT: 50.9 % — ABNORMAL HIGH (ref 36.0–46.0)
Hemoglobin: 16.2 g/dL — ABNORMAL HIGH (ref 12.0–15.0)
Immature Granulocytes: 0 %
Lymphocytes Relative: 34 %
Lymphs Abs: 2.5 10*3/uL (ref 0.7–4.0)
MCH: 30.9 pg (ref 26.0–34.0)
MCHC: 31.8 g/dL (ref 30.0–36.0)
MCV: 97.1 fL (ref 80.0–100.0)
Monocytes Absolute: 0.6 10*3/uL (ref 0.1–1.0)
Monocytes Relative: 9 %
Neutro Abs: 3.7 10*3/uL (ref 1.7–7.7)
Neutrophils Relative %: 51 %
Platelets: 213 10*3/uL (ref 150–400)
RBC: 5.24 MIL/uL — ABNORMAL HIGH (ref 3.87–5.11)
RDW: 13.8 % (ref 11.5–15.5)
WBC: 7.2 10*3/uL (ref 4.0–10.5)
nRBC: 0 % (ref 0.0–0.2)

## 2023-08-05 LAB — RESP PANEL BY RT-PCR (RSV, FLU A&B, COVID)  RVPGX2
Influenza A by PCR: NEGATIVE
Influenza B by PCR: NEGATIVE
Resp Syncytial Virus by PCR: NEGATIVE
SARS Coronavirus 2 by RT PCR: NEGATIVE

## 2023-08-05 LAB — PROTIME-INR
INR: 1 (ref 0.8–1.2)
Prothrombin Time: 13.7 s (ref 11.4–15.2)

## 2023-08-05 LAB — COMPREHENSIVE METABOLIC PANEL WITH GFR
ALT: 18 U/L (ref 0–44)
AST: 24 U/L (ref 15–41)
Albumin: 3.4 g/dL — ABNORMAL LOW (ref 3.5–5.0)
Alkaline Phosphatase: 87 U/L (ref 38–126)
Anion gap: 11 (ref 5–15)
BUN: 19 mg/dL (ref 8–23)
CO2: 26 mmol/L (ref 22–32)
Calcium: 10 mg/dL (ref 8.9–10.3)
Chloride: 104 mmol/L (ref 98–111)
Creatinine, Ser: 1.35 mg/dL — ABNORMAL HIGH (ref 0.44–1.00)
GFR, Estimated: 40 mL/min — ABNORMAL LOW (ref >60)
Glucose, Bld: 192 mg/dL — ABNORMAL HIGH (ref 70–99)
Potassium: 3.8 mmol/L (ref 3.5–5.1)
Sodium: 141 mmol/L (ref 135–145)
Total Bilirubin: 0.7 mg/dL (ref 0.0–1.2)
Total Protein: 7 g/dL (ref 6.5–8.1)

## 2023-08-05 LAB — BRAIN NATRIURETIC PEPTIDE: B Natriuretic Peptide: 117 pg/mL — ABNORMAL HIGH (ref 0.0–100.0)

## 2023-08-05 LAB — GLUCOSE, CAPILLARY: Glucose-Capillary: 147 mg/dL — ABNORMAL HIGH (ref 70–99)

## 2023-08-05 LAB — APTT: aPTT: 30 s (ref 24–36)

## 2023-08-05 LAB — ETHANOL: Alcohol, Ethyl (B): <15 mg/dL (ref <15)

## 2023-08-05 LAB — TROPONIN I (HIGH SENSITIVITY)
Troponin I (High Sensitivity): 11 ng/L (ref <18)
Troponin I (High Sensitivity): 9 ng/L (ref <18)

## 2023-08-05 MED ORDER — ROSUVASTATIN CALCIUM 20 MG PO TABS
40.0000 mg | ORAL_TABLET | Freq: Every day | ORAL | Status: DC
  Administered 2023-08-06 – 2023-08-07 (×2): 40 mg via ORAL
  Filled 2023-08-05 (×2): qty 2

## 2023-08-05 MED ORDER — INSULIN ASPART 100 UNIT/ML IJ SOLN: 0.0000 [IU] | Freq: Every day | INTRAMUSCULAR | Status: DC

## 2023-08-05 MED ORDER — HYDROCODONE-ACETAMINOPHEN 7.5-325 MG PO TABS
1.0000 | ORAL_TABLET | Freq: Four times a day (QID) | ORAL | Status: DC | PRN
  Administered 2023-08-06: 1 via ORAL
  Filled 2023-08-05: qty 1

## 2023-08-05 MED ORDER — NICOTINE 21 MG/24HR TD PT24
21.0000 mg | MEDICATED_PATCH | Freq: Every day | TRANSDERMAL | Status: DC
  Administered 2023-08-05 – 2023-08-07 (×3): 21 mg via TRANSDERMAL
  Filled 2023-08-05 (×3): qty 1

## 2023-08-05 MED ORDER — ALBUTEROL SULFATE (2.5 MG/3ML) 0.083% IN NEBU: 2.5000 mg | INHALATION_SOLUTION | RESPIRATORY_TRACT | Status: DC | PRN

## 2023-08-05 MED ORDER — ENOXAPARIN SODIUM 40 MG/0.4ML IJ SOSY
40.0000 mg | PREFILLED_SYRINGE | INTRAMUSCULAR | Status: DC
  Administered 2023-08-05: 40 mg via SUBCUTANEOUS
  Filled 2023-08-05 (×3): qty 0.4

## 2023-08-05 MED ORDER — HYDRALAZINE HCL 20 MG/ML IJ SOLN: 5.0000 mg | INTRAMUSCULAR | Status: DC | PRN

## 2023-08-05 MED ORDER — SERTRALINE HCL 50 MG PO TABS
25.0000 mg | ORAL_TABLET | Freq: Every day | ORAL | Status: DC
  Administered 2023-08-06 – 2023-08-07 (×2): 25 mg via ORAL
  Filled 2023-08-05 (×2): qty 1

## 2023-08-05 MED ORDER — IPRATROPIUM-ALBUTEROL 0.5-2.5 (3) MG/3ML IN SOLN
3.0000 mL | Freq: Once | RESPIRATORY_TRACT | Status: AC
  Administered 2023-08-05: 3 mL via RESPIRATORY_TRACT
  Filled 2023-08-05: qty 3

## 2023-08-05 MED ORDER — INSULIN ASPART 100 UNIT/ML IJ SOLN
0.0000 [IU] | Freq: Three times a day (TID) | INTRAMUSCULAR | Status: DC
  Administered 2023-08-06 (×2): 1 [IU] via SUBCUTANEOUS
  Administered 2023-08-06: 3 [IU] via SUBCUTANEOUS
  Administered 2023-08-07 (×2): 1 [IU] via SUBCUTANEOUS
  Filled 2023-08-05 (×5): qty 1

## 2023-08-05 MED ORDER — SERTRALINE HCL 50 MG PO TABS: 25.0000 mg | ORAL_TABLET | Freq: Every day | ORAL | Status: DC

## 2023-08-05 MED ORDER — ACETAMINOPHEN 160 MG/5ML PO SOLN: 650.0000 mg | ORAL | Status: DC | PRN

## 2023-08-05 MED ORDER — ACETAMINOPHEN 325 MG PO TABS
650.0000 mg | ORAL_TABLET | ORAL | Status: AC | PRN
Start: 2023-08-05 — End: ?

## 2023-08-05 MED ORDER — CLOPIDOGREL BISULFATE 75 MG PO TABS
75.0000 mg | ORAL_TABLET | Freq: Every day | ORAL | Status: DC
  Administered 2023-08-06 – 2023-08-07 (×2): 75 mg via ORAL
  Filled 2023-08-05 (×2): qty 1

## 2023-08-05 MED ORDER — PANTOPRAZOLE SODIUM 40 MG PO TBEC
40.0000 mg | DELAYED_RELEASE_TABLET | Freq: Every day | ORAL | Status: DC
  Administered 2023-08-06 – 2023-08-07 (×2): 40 mg via ORAL
  Filled 2023-08-05 (×2): qty 1

## 2023-08-05 MED ORDER — ASPIRIN 81 MG PO TBEC
81.0000 mg | DELAYED_RELEASE_TABLET | Freq: Every day | ORAL | Status: DC
  Administered 2023-08-06 – 2023-08-07 (×2): 81 mg via ORAL
  Filled 2023-08-05 (×2): qty 1

## 2023-08-05 MED ORDER — GADOBUTROL 1 MMOL/ML IV SOLN
7.0000 mL | Freq: Once | INTRAVENOUS | Status: AC | PRN
  Administered 2023-08-05: 7 mL via INTRAVENOUS

## 2023-08-05 MED ORDER — FINERENONE 10 MG PO TABS: 10.0000 mg | ORAL_TABLET | Freq: Every day | ORAL | Status: DC

## 2023-08-05 MED ORDER — EZETIMIBE 10 MG PO TABS
10.0000 mg | ORAL_TABLET | Freq: Every day | ORAL | Status: DC
  Administered 2023-08-06 – 2023-08-07 (×2): 10 mg via ORAL
  Filled 2023-08-05 (×2): qty 1

## 2023-08-05 MED ORDER — INSULIN GLARGINE-YFGN 100 UNIT/ML ~~LOC~~ SOLN
11.0000 [IU] | Freq: Every day | SUBCUTANEOUS | Status: DC
  Administered 2023-08-06 – 2023-08-07 (×2): 11 [IU] via SUBCUTANEOUS
  Filled 2023-08-05 (×2): qty 0.11

## 2023-08-05 MED ORDER — NITROGLYCERIN 0.4 MG SL SUBL: 0.4000 mg | SUBLINGUAL_TABLET | SUBLINGUAL | Status: DC | PRN

## 2023-08-05 MED ORDER — DM-GUAIFENESIN ER 30-600 MG PO TB12
1.0000 | ORAL_TABLET | Freq: Two times a day (BID) | ORAL | Status: DC | PRN
  Administered 2023-08-07: 1 via ORAL
  Filled 2023-08-05: qty 1

## 2023-08-05 MED ORDER — STROKE: EARLY STAGES OF RECOVERY BOOK: Freq: Once | Status: AC

## 2023-08-05 MED ORDER — IPRATROPIUM-ALBUTEROL 0.5-2.5 (3) MG/3ML IN SOLN
3.0000 mL | RESPIRATORY_TRACT | Status: DC
  Administered 2023-08-06 (×3): 3 mL via RESPIRATORY_TRACT
  Filled 2023-08-05 (×3): qty 3

## 2023-08-05 MED ORDER — ACETAMINOPHEN 650 MG RE SUPP: 650.0000 mg | RECTAL | Status: DC | PRN

## 2023-08-05 MED ORDER — SENNOSIDES-DOCUSATE SODIUM 8.6-50 MG PO TABS: 1.0000 | ORAL_TABLET | Freq: Every evening | ORAL | Status: DC | PRN

## 2023-08-05 MED ORDER — ONDANSETRON HCL 4 MG/2ML IJ SOLN: 4.0000 mg | Freq: Three times a day (TID) | INTRAMUSCULAR | Status: DC | PRN

## 2023-08-05 MED ORDER — ALPRAZOLAM 0.5 MG PO TABS
0.5000 mg | ORAL_TABLET | Freq: Three times a day (TID) | ORAL | Status: DC | PRN
  Administered 2023-08-06 (×2): 0.5 mg via ORAL
  Filled 2023-08-05 (×2): qty 1

## 2023-08-05 NOTE — ED Notes (Signed)
 Pt able to walk 58ft with walker unassisted. Pt is not able to walk without a walker due to being very unsteady.

## 2023-08-05 NOTE — ED Provider Notes (Signed)
  Physical Exam  BP (!) 176/96   Pulse 72   Temp (!) 97.5 F (36.4 C) (Oral)   Resp (!) 30   Ht 5' 6 (1.676 m)   Wt 72.6 kg   SpO2 97%   BMI 25.82 kg/m   Physical Exam  Procedures  Procedures  ED Course / MDM   Clinical Course as of 08/05/23 1921  Sat Aug 05, 2023  1800 MR BRAIN WO CONTRAST  Small acute infarcts in the left frontal lobe, right parietal lobe, and left cerebellum.   [DW]    Clinical Course User Index [DW] Kandee Orion, MD   Medical Decision Making Amount and/or Complexity of Data Reviewed Labs: ordered. Radiology: ordered. Decision-making details documented in ED Course.  Risk Prescription drug management. Decision regarding hospitalization.   Received signout on patient.  78 year old female with prior history of CVA presenting today for worsening weakness as well as shortness of breath.  Vital signs unremarkable and no hypoxia from her baseline.  Initial CBC, CMP largely reassuring.  BNP at 117.  Patient initially had CT head which showed nothing acute but MRIs were ordered and neurology was consulted for further evaluation.  Signed out pending results of MRI.  MRI brain shows small acute infarcts in the left frontal, right parietal, and left cerebellum.  Discussed with neurology who does recommend admission for ongoing workup.  Patient admitted to hospitalist for further care.     Kandee Orion, MD 08/05/23 [phone]

## 2023-08-05 NOTE — ED Triage Notes (Addendum)
 Pt via POV from home. States that since Tuesday she has been having increased weakness and SOB. Pt had a stroke in January. Daughter reports around 5pm yesterday she noticed she had increased generalized weakness, loss of balance. Pt stated also having a harder time finding her words but unable to establish LKW, pt poor historian. Pt has a hx of COPD, pt is suppose to 2L [place] chronically but pt non-compliant. Pt is A&Ox4 and NAD   Bilateral grip strength equal. No facial droop noted.

## 2023-08-05 NOTE — Discharge Instructions (Addendum)
 IMPRESSION: 1. No acute intracranial abnormality. 2. Moderate atrophy and white matter disease. 3. Remote white matter infarcts in the corona radiata bilaterally and remote lacunar infarcts in the right basal ganglia and thalami bilaterally. 4. Atherosclerotic calcifications in the cavernous carotid arteries bilaterally and at the dural margin of both vertebral arteries. No hyperdense vessel is present.

## 2023-08-05 NOTE — ED Provider Notes (Addendum)
 [facility] Provider Note    Event Date/Time   First MD Initiated Contact with Patient 08/05/23 1306     (approximate)   History   Weakness and Aphasia   HPI  Hannah Kirby is a 78 y.o. female with history of diabetes, hyperlipidemia, hypertension who comes in with weakness, aphasia.  Since Tuesday patient's had increasing weakness and shortness of breath.  She did have a prior stroke back in January.  Contrary to triage note patient's daughter reports that she was last normal on Thursday.  They report on Friday they noticed some issues with her walking.  She just seemed more off balance and more weak than normal they also noted some shortness of breath.  Daughter states that she has not noticed any changes in her speech but the patient herself had mention today that her speech was a little bit hard to come up with words which is why they presented to the emergency room for evaluation.  They deny any known falls hitting her head, chest pain.  Does have some shortness of breath, has had a worsening cough patient supposed to be on 2 L for COPD but patient is intermittently noncompliant.   Physical Exam   Triage Vital Signs: ED Triage Vitals  Encounter Vitals Group     BP 08/05/23 1151 (!) 164/80     Girls Systolic BP Percentile --      Girls Diastolic BP Percentile --      Boys Systolic BP Percentile --      Boys Diastolic BP Percentile --      Pulse Rate 08/05/23 1151 72     Resp 08/05/23 1151 18     Temp 08/05/23 1151 (!) 97.5 F (36.4 C)     Temp Source 08/05/23 1151 Oral     SpO2 08/05/23 1151 96 %     Weight 08/05/23 1150 160 lb (72.6 kg)     Height 08/05/23 1150 5' 6 (1.676 m)     Head Circumference --      Peak Flow --      Pain Score 08/05/23 1146 6     Pain Loc --      Pain Education --      Exclude from Growth Chart --     Most recent vital signs: Vitals:   08/05/23 1151  BP: (!) 164/80  Pulse: 72  Resp: 18  Temp: (!) 97.5 F  (36.4 C)  SpO2: 96%     General: Awake, no distress.  CV:  Good peripheral perfusion.  Resp:  Normal effort.  Breath sounds are tight but no obvious wheeze Abd:  No distention.  Soft and nontender Other:  No swelling legs.  No calf tenderness.  Cranial nerves appear intact.  Equal strength in arms and legs.   ED Results / Procedures / Treatments   Labs (all labs ordered are listed, but only abnormal results are displayed) Labs Reviewed  COMPREHENSIVE METABOLIC PANEL WITH GFR - Abnormal; Notable for the following components:      Result Value   Glucose, Bld 192 (*)    Creatinine, Ser 1.35 (*)    Albumin 3.4 (*)    GFR, Estimated 40 (*)    All other components within normal limits  CBC WITH DIFFERENTIAL/PLATELET - Abnormal; Notable for the following components:   RBC 5.24 (*)    Hemoglobin 16.2 (*)    HCT 50.9 (*)    All other components within normal limits  PROTIME-INR  APTT  ETHANOL  URINALYSIS, W/ REFLEX TO CULTURE (INFECTION SUSPECTED)  BRAIN NATRIURETIC PEPTIDE  CBG MONITORING, ED  TROPONIN I (HIGH SENSITIVITY)     EKG  My interpretation of EKG:  Normal sinus rate of 70 without any ST elevation or T wave inversions, normal intervals  RADIOLOGY I have reviewed the xray personally and interpreted no pneumonia   PROCEDURES:  Critical Care performed: No  Procedures   MEDICATIONS ORDERED IN ED: Medications - No data to display   IMPRESSION / MDM / ASSESSMENT AND PLAN / ED COURSE  I reviewed the triage vital signs and the nursing notes.   Patient's presentation is most consistent with acute presentation with potential threat to life or bodily function.   Patient comes in with concerns for increasing weakness, shortness of breath possibly some speech issues last known normal of Thursday.  Stroke code was not called given out of the window.  On my neurological examination it overall seems very reassuring.  I do not see evidence of LVO but this is been  ongoing for over 24 hours regardless.  She has got no significant wheezing but will trial DuoNeb given she does sound tight.  Her CT head does show some calcification.  She has had prior MRIs that have shown some stenosis of different areas so she is high risk for having a stroke therefore we will get MRI to evaluate for new stroke.  We also get CT PE to evaluate for any pneumonia, pulmonary embolism that could be causing patient's shortness of breath.  Patient did appear more weak and was unsteady with trying to stand up but she was able to walk with a walker unassisted however according to family members this is a change over the past 48 hours that typically she can walk without any issues.  Coags are normal CMP shows slightly elevated creatinine similar to prior.  Ethanol negative CBC shows normal white count  CT head with remote infarcts but does have calcifications of the arteries noted no acute intracranial hemorrhage  Patient handed off to oncoming team pending CT imaging, MRI, urine, further disposition   Before leaving I was called by MRI who did state that it look like patient had had a possible acute stroke they wanted to know if we wanted MRI MRAs.  I did order these.  I did call neurology to consult them as well as I will be leaving.   I did discuss this case with Dr. Alecia Ames who he did not need to be a stroke code.  I did confirm with patient's daughter that she was last normal Thursday night and that when she got there at Friday at 5 PM she noticed the abnormality but is unclear what time the symptoms started on Friday.  The patient is on the cardiac monitor to evaluate for evidence of arrhythmia and/or significant heart rate changes.      FINAL CLINICAL IMPRESSION(S) / ED DIAGNOSES   Final diagnoses:  Weakness     Rx / DC Orders   ED Discharge Orders     None        Note:  This document was prepared using Dragon voice recognition software and may include  unintentional dictation errors.   Lubertha Rush, MD 08/05/23 1535    Lubertha Rush, MD 08/05/23 1547    Lubertha Rush, MD 08/05/23 1556

## 2023-08-05 NOTE — H&P (Signed)
 History and Physical    Hannah Kirby WUJ:[id number] DOB: [birth year] DOA: 08/05/2023  Referring MD/NP/PA:   PCP: Lamon Pillow, MD   Patient coming from:  The patient is coming from home.     Chief Complaint: Weakness, poor balance, difficult speaking  HPI: Hannah Kirby is a 78 y.o. female with medical history significant of stroke with dysarthria, hypertension, hyperlipidemia, diabetes mellitus, COPD (supposed to use 2 L oxygen , not using it consistently), stroke, GERD, kidney stone, CKD stage IIIb, CAD, stent placement, AAA, tobacco abuse, C. difficile colitis, who presents with weakness, poor balance, difficulty speaking.  Per her daughter, patient has history of stroke with dysarthria;  in the past 4 days, patient is noticed to have weakness. She developed poor balance and difficulty walking since yesterday afternoon.  No facial droop noted.  Patient has mild SOB and mild dry cough, no chest pain, fever or chills.  Denies symptoms of UTI.  No nausea, vomiting, diarrhea or abdominal pain.  Patient is very anxious during the interview.  Data reviewed independently and ED Course: pt was found to have WBC 7.2, negative PCR for COVID, flu and RSV, stable renal function, alcohol level less than 15, troponin 11 --> 9.  Temperature 97.5, blood pressure 172/90, 160/90, heart rate 72, RR 20, oxygen  sat 96% on home level 2 L of oxygen .  Chest x-ray negative.  Patient is placed in telemetry bed for observation.  Dr. Alecia Ames of neurology is consulted.  CT-head: 1. No acute intracranial abnormality. 2. Moderate atrophy and white matter disease. 3. Remote white matter infarcts in the corona radiata bilaterally and remote lacunar infarcts in the right basal ganglia and thalami bilaterally. 4. Atherosclerotic calcifications in the cavernous carotid arteries bilaterally and at the dural margin of both vertebral arteries. No hyperdense vessel is present.   MRI-brain and MRA of head and  neck: 1. Small acute infarcts in the left frontal lobe, right parietal  lobe, and left cerebellum.  2. Extensive chronic small vessel ischemic disease.  3. 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  4. Unchanged severe right A3 stenosis or segmental occlusion.  5. Moderate left P2 stenosis.  6. Limited assessment of the left vertebral artery origin, otherwise  negative neck MRA.   EKG: I have personally reviewed.  Sinus rhythm, QTc 468, LAD, poor R wave progression.   Review of Systems:   General: no fevers, chills, no body weight gain, has fatigue HEENT: no blurry vision, hearing changes or sore throat Respiratory: no dyspnea, coughing, wheezing CV: no chest pain, no palpitations GI: no nausea, vomiting, abdominal pain, diarrhea, constipation GU: no dysuria, burning on urination, increased urinary frequency, hematuria  Ext: no leg edema Neuro: no unilateral weakness, numbness, or tingling, no vision change or hearing loss.  Has dysarthria, poor balance Skin: no rash, no skin tear. MSK: No muscle spasm, no deformity, no limitation of range of movement in spin Heme: No easy bruising.  Travel history: No recent long distant travel.   Allergy:  Allergies  Allergen Reactions   Spironolactone  Shortness Of Breath   Sulfa Antibiotics Other (See Comments) and Rash    Mouth blisters  Welts on mouth   Sulfonamide Derivatives Other (See Comments)    Last taken as a child; made her mouth break out   Other Other (See Comments)    Mouth blisters   Codeine Nausea And Vomiting and Other (See Comments)    Nausea   Prednisone  Palpitations and Other (See Comments)  Made her legs feel weird.    Past Medical History:  Diagnosis Date   Acute hemorrhagic colitis 08/08/2019   Anemia    Anxiety    Arthritis    C. difficile colitis 08/09/2019   CAD (coronary artery disease)    a. 02/2006 PCI: BMS x 2 to RCA, cath o/w without significant coronary disease; b. nuclear stress test  07/2014: No ischemia/infarct; c. 11/2017 MV: no isch/infarct, EF 55-65%; d. 03/2020 NSTEMI/PCI: LM nl, LAD min irregs, RI 25, small, LCX nl, RCA 30p/m ISR, 99d (3.0x15 Resolute Onyx DES).   Cerebrovascular accident (CVA) (HCC) 03/18/2023   dysarthria   Chronic bronchitis (HCC)    secondary to cigarette smoking   Chronic kidney disease (CKD), stage III (moderate) (HCC)    COPD (chronic obstructive pulmonary disease) (HCC)    Diabetes mellitus    Diastolic dysfunction    a. 07/2019 Echo: EF 60-65%, no rwma, mod LVH. Nl RV size/fxn; b. 03/2020 Echo: EF 60-65%, mod LVH, gr1 DD, nl RV size/fxn, mild AS.   FHx: allergies    GERD (gastroesophageal reflux disease)    Goiter    Granulomatous disease (HCC)    Hernia    Hyperlipidemia    Hypertension    Kidney stone on left side 2013   Microalbuminuria    NSTEMI (non-ST elevated myocardial infarction) (HCC) 03/23/2020   Obesity    Panic attacks    PVC's (premature ventricular contractions)    a. 03/2018 Zio: Occas PVCs (2.5%). Triggered events assoc w/ PVC/PAC.   Retinal tear 2020   Smokers' cough (HCC)    Spinal stenosis    Stroke (HCC) 10/29/2016   mild left side weakness   Stroke (HCC) 08/01/2019   Tobacco abuse     Past Surgical History:  Procedure Laterality Date   ABDOMINAL HYSTERECTOMY     BREAST SURGERY     CATARACT EXTRACTION W/PHACO Right 03/01/2017   Procedure: CATARACT EXTRACTION PHACO AND INTRAOCULAR LENS PLACEMENT (IOC) RIGHT DIABETIC;  Surgeon: Annell Kidney, MD;  Location: [facility];  Service: Ophthalmology;  Laterality: Right;   CATARACT EXTRACTION W/PHACO Left 03/22/2017   Procedure: CATARACT EXTRACTION PHACO AND INTRAOCULAR LENS PLACEMENT (IOC) LEFT DIABETIC;  Surgeon: Annell Kidney, MD;  Location: [facility];  Service: Ophthalmology;  Laterality: Left;  Diabetic - insulin  and oral meds   COLONOSCOPY WITH PROPOFOL  N/A 09/23/2014   Procedure: COLONOSCOPY WITH PROPOFOL ;  Surgeon: Deveron Fly, MD;  Location: [facility] ENDOSCOPY;  Service: Endoscopy;  Laterality: N/A;   COLONOSCOPY WITH PROPOFOL  N/A 01/11/2018   Procedure: COLONOSCOPY WITH PROPOFOL ;  Surgeon: Deveron Fly, MD;  Location: [facility] ENDOSCOPY;  Service: Endoscopy;  Laterality: N/A;   COLONOSCOPY WITH PROPOFOL  N/A 04/24/2018   Procedure: COLONOSCOPY WITH PROPOFOL ;  Surgeon: Deveron Fly, MD;  Location: [facility] ENDOSCOPY;  Service: Endoscopy;  Laterality: N/A;   COLONOSCOPY WITH PROPOFOL  N/A 11/19/2019   Procedure: COLONOSCOPY WITH PROPOFOL ;  Surgeon: Marnee Sink, MD;  Location: [facility] ENDOSCOPY;  Service: Endoscopy;  Laterality: N/A;   CORONARY ANGIOPLASTY WITH STENT PLACEMENT  2008   CORONARY STENT INTERVENTION N/A 03/24/2020   Procedure: CORONARY STENT INTERVENTION;  Surgeon: Sammy Crisp, MD;  Location: [facility] INVASIVE CV LAB;  Service: Cardiovascular;  Laterality: N/A;   ESOPHAGOGASTRODUODENOSCOPY (EGD) WITH PROPOFOL  N/A 12/30/2014   Procedure: ESOPHAGOGASTRODUODENOSCOPY (EGD) WITH PROPOFOL ;  Surgeon: Deveron Fly, MD;  Location: [facility] ENDOSCOPY;  Service: Endoscopy;  Laterality: N/A;   ESOPHAGOGASTRODUODENOSCOPY (EGD) WITH PROPOFOL  N/A 07/19/2016   Procedure: ESOPHAGOGASTRODUODENOSCOPY (EGD) WITH PROPOFOL ;  Surgeon: Deveron Fly, MD;  Location: [facility] ENDOSCOPY;  Service: Endoscopy;  Laterality: N/A;   ESOPHAGOGASTRODUODENOSCOPY (EGD) WITH PROPOFOL  N/A 04/24/2018   Procedure: ESOPHAGOGASTRODUODENOSCOPY (EGD) WITH PROPOFOL ;  Surgeon: Deveron Fly, MD;  Location[facility] END[facility]COPY;  Service: Endoscopy;  Laterality: N/A;   hysterectomy (other)     LEFT HEART CATH AND CORONARY ANGIOGRAPHY N/A 03/24/2020   Procedure: LEFT HEART CATH AND CORONARY ANGIOGRAPHY;  Surgeon: Sammy Crisp, MD;  Location: [facility] INVASIVE CV LAB;  Service: Cardiovascular;  Laterality: N/A;    Social History:  reports that she has been smoking cigarettes. She started smoking about 56 years ago. She has a 112.9 pack-year smoking history.  She has never used smokeless tobacco. She reports current alcohol use. She reports that she does not use drugs.  Family History:  Family History  Problem Relation Age of Onset   Heart failure Mother    Stroke Mother    Diabetes Mother    Congestive Heart Failure Mother    Lung cancer Father    Breast cancer Maternal Aunt      Prior to Admission medications   Medication Sig Start Date End Date Taking? Authorizing Provider  acetaminophen  (TYLENOL ) 500 MG tablet Take 500 mg by mouth every 6 (six) hours as needed.    [provider]  albuterol  (VENTOLIN  HFA) 108 (90 Base) MCG/ACT inhaler Inhale 2 puffs into the lungs every 4 (four) hours as needed for wheezing or shortness of breath. 04/08/22   Lamon Pillow, MD  aspirin  (ASPIR-81) 81 MG EC tablet Take 81 mg by mouth daily.    [provider]  benzonatate  (TESSALON ) 100 MG capsule Take 1 capsule (100 mg total) by mouth every 8 (eight) hours. 07/24/23   Lamon Pillow, MD  calcium  carbonate (TUMS - DOSED IN MG ELEMENTAL CALCIUM ) 500 MG chewable tablet Chew 1 tablet by mouth daily as needed.    [provider]  clopidogrel  (PLAVIX ) 75 MG tablet Take 1 tablet (75 mg total) by mouth daily. 08/16/22   Gollan, Timothy J, MD  cyanocobalamin  1000 MCG tablet Take 1,000 mcg by mouth daily.    [provider]  dapagliflozin  propanediol (FARXIGA ) 5 MG TABS tablet Take 1 tablet (5 mg total) by mouth daily before breakfast. Patient taking differently: Take 2.5 mg by mouth 2 (two) times daily. 02/20/23   Carlean Charter, DO  Docusate Sodium  (DSS) 100 MG CAPS Take 1 capsule by mouth daily.    [provider]  ezetimibe  (ZETIA ) 10 MG tablet TAKE ONE TABLET BY MOUTH EVERY DAY 06/30/23   Gollan, Timothy J, MD  felodipine  (PLENDIL ) 5 MG 24 hr tablet TAKE 1 TABLET BY MOUTH DAILY 06/04/23   Lamon Pillow, MD  Finerenone (KERENDIA) 10 MG TABS Take 10 mg by mouth. 05/31/23 05/30/24  [provider]   Fluticasone -Umeclidin-Vilant (TRELEGY ELLIPTA ) 100-62.5-25 MCG/ACT AEPB INHALE 1 PUFF BY MOUTH EVERY DAY - DISCARD DEVICE 6 WEEKS AFTER IT IS REMOVED FROM THE FOIL TRAY OR WHEN THE DOSE COUNTER READS &quot;0&quot; (WHICHEVER COMES FIRST) 06/10/23   Lamon Pillow, MD  HYDROcodone -acetaminophen  (NORCO) 7.5-325 MG tablet Take 1 tablet by mouth every 6 (six) hours as needed for moderate pain (pain score 4-6). 03/04/23   Lamon Pillow, MD  insulin  glargine (LANTUS ) 100 UNIT/ML injection Inject 22 Units into the skin daily.    [provider]  lisinopril  (ZESTRIL ) 20 MG tablet Take 1 tablet (20 mg total) by mouth daily. 08/16/22   Gollan, Timothy  J, MD  meclizine  (ANTIVERT ) 25 MG tablet Take 25 mg by mouth 3 (three) times daily as needed. 02/04/22   [provider]  metoprolol  tartrate (LOPRESSOR ) 100 MG tablet Take 1 tablet (100 mg total) by mouth 2 (two) times daily. 08/16/22   Gollan, Timothy J, MD  mirtazapine  (REMERON ) 7.5 MG tablet Take 1 tablet (7.5 mg total) by mouth at bedtime. Patient not taking: Reported on 07/05/2023 06/06/23   Lamon Pillow, MD  Multiple Vitamins-Minerals (DAILY MULTI) TABS Take 1 tablet by mouth daily.    [provider]  nitroGLYCERIN  (NITROSTAT ) 0.4 MG SL tablet Place 1 tablet (0.4 mg total) under the tongue every 5 (five) minutes as needed. 08/16/22   Gollan, Timothy J, MD  Omega-3 Fatty Acids (FISH OIL ) 1000 MG CAPS Take 2 capsules by mouth in the morning and at bedtime.     [provider]  ondansetron  (ZOFRAN  ODT) 4 MG disintegrating tablet Take 1 tablet (4 mg total) by mouth every 8 (eight) hours as needed for nausea or vomiting. 08/06/16   Gearline Kell, [place], MD  RABEprazole  (ACIPHEX ) 20 MG tablet Take 1 tablet (20 mg total) by mouth daily. 15 Mins. before evening meal 11/08/22   Marnee Sink, MD  rosuvastatin  (CRESTOR ) 40 MG tablet Take 1 tablet (40 mg total) by mouth daily. 03/30/23   Lamon Pillow, MD  Semaglutide,0.25 or  0.5MG /DOS, 2 MG/1.5ML SOPN Inject 0.5 mg into the skin once a week. 01/13/20   [provider]  sertraline (ZOLOFT) 25 MG tablet Take 1/2 tablet once daily for one week, then inrease to 1 tablet once daily and continue 06/01/23   [provider]  sucralfate  (CARAFATE ) 1 g tablet Take 1 tablet (1 g total) by mouth 2 (two) times daily. 15 Minutes before evening meal and at bed time 11/08/22   Marnee Sink, MD    Physical Exam: Vitals:   08/05/23 1700 08/05/23 1730 08/05/23 1800 08/05/23 1830  BP: (!) 172/90 (!) 162/90 (!) 168/86 (!) 176/96  Pulse: 66 69 66 72  Resp: 18 20 20  (!) 30  Temp:      TempSrc:      SpO2: 100% 96% 99% 97%  Weight:      Height:       General: Not in acute distress HEENT:       Eyes: PERRL, EOMI, no jaundice       ENT: No discharge from the ears and nose, no pharynx injection, no tonsillar enlargement.        Neck: No JVD, no bruit, no mass felt. Heme: No neck lymph node enlargement. Cardiac: S1/S2, RRR, No murmurs, No gallops or rubs. Respiratory: No rales, wheezing, rhonchi or rubs. GI: Soft, nondistended, nontender, no rebound pain, no organomegaly, BS present. GU: No hematuria Ext: No pitting leg edema bilaterally. 1+DP/PT pulse bilaterally. Musculoskeletal: No joint deformities, No joint redness or warmth, no limitation of ROM in spin. Skin: No rashes.  Neuro: Alert, has difficult speaking, oriented X3, cranial nerves II-XII grossly intact.  Muscle strength 4/5 in all extremities, symmetric, sensation to light touch intact.  Psych: Patient is not psychotic, no suicidal or hemocidal ideation.  Labs on Admission: I have personally reviewed following labs and imaging studies  CBC: Recent Labs  Lab 08/05/23 1154  WBC 7.2  NEUTROABS 3.7  HGB 16.2*  HCT 50.9*  MCV 97.1  PLT 213   Basic Metabolic Panel: Recent Labs  Lab 08/05/23 1154  NA 141  K 3.8  CL  104  CO2 26  GLUCOSE 192*  BUN 19  CREATININE 1.35*  CALCIUM  10.0    GFR: Estimated Creatinine Clearance: 35.6 mL/min (A) (by C-G formula based on SCr of 1.35 mg/dL (H)). Liver Function Tests: Recent Labs  Lab 08/05/23 1154  AST 24  ALT 18  ALKPHOS 87  BILITOT 0.7  PROT 7.0  ALBUMIN 3.4*   No results for input(s): LIPASE, AMYLASE in the last 168 hours. No results for input(s): AMMONIA in the last 168 hours. Coagulation Profile: Recent Labs  Lab 08/05/23 1154  INR 1.0   Cardiac Enzymes: No results for input(s): CKTOTAL, CKMB, CKMBINDEX, TROPONINI in the last 168 hours. BNP (last 3 results) No results for input(s): PROBNP in the last 8760 hours. HbA1C: No results for input(s): HGBA1C in the last 72 hours. CBG: No results for input(s): GLUCAP in the last 168 hours. Lipid Profile: No results for input(s): CHOL, HDL, LDLCALC, TRIG, CHOLHDL, LDLDIRECT in the last 72 hours. Thyroid  Function Tests: No results for input(s): TSH, T4TOTAL, FREET4, T3FREE, THYROIDAB in the last 72 hours. Anemia Panel: No results for input(s): VITAMINB12, FOLATE, FERRITIN, TIBC, IRON, RETICCTPCT in the last 72 hours. Urine analysis:    Component Value Date/Time   COLORURINE YELLOW (A) 03/17/2023 0752   APPEARANCEUR HAZY (A) 03/17/2023 0752   LABSPEC 1.017 03/17/2023 0752   PHURINE 5.0 03/17/2023 0752   GLUCOSEU >=500 (A) 03/17/2023 0752   HGBUR NEGATIVE 03/17/2023 0752   BILIRUBINUR NEGATIVE 03/17/2023 0752   BILIRUBINUR Negative 08/24/2022 0000   KETONESUR NEGATIVE 03/17/2023 0752   PROTEINUR 100 (A) 03/17/2023 0752   NITRITE NEGATIVE 03/17/2023 0752   LEUKOCYTESUR SMALL (A) 03/17/2023 0752   Sepsis Labs: @LABRCNTIP (procalcitonin:4,lacticidven:4) ) Recent Results (from the past 240 hours)  Resp panel by RT-PCR (RSV, Flu A&B, Covid) Anterior Nasal Swab     Status: None   Collection Time: 08/05/23  1:50 PM   Specimen: Anterior Nasal Swab  Result Value Ref Range Status   SARS Coronavirus 2 by RT PCR  NEGATIVE NEGATIVE Final    Comment: (NOTE) SARS-CoV-2 target nucleic acids are NOT DETECTED.  The SARS-CoV-2 RNA is generally detectable in upper respiratory specimens during the acute phase of infection. The lowest concentration of SARS-CoV-2 viral copies this assay can detect is 138 copies/mL. A negative result does not preclude SARS-Cov-2 infection and should not be used as the sole basis for treatment or other patient management decisions. A negative result may occur with  improper specimen collection/handling, submission of specimen other than nasopharyngeal swab, presence of viral mutation(s) within the areas targeted by this assay, and inadequate number of viral copies(<138 copies/mL). A negative result must be combined with clinical observations, patient history, and epidemiological information. The expected result is Negative.  Fact Sheet for Patients:  BloggerCourse.com  Fact Sheet for Healthcare Providers:  SeriousBroker.it  This test is no t yet approved or cleared by the [place]  FDA and  has been authorized for detection and/or diagnosis of SARS-CoV-2 by FDA under an Emergency Use Authorization (EUA). This EUA will remain  in effect (meaning this test can be used) for the duration of the COVID-19 declaration under Section 564(b)(1) of the Act, 21 U.S.C.section 360bbb-3(b)(1), unless the authorization is terminated  or revoked sooner.       Influenza A by PCR NEGATIVE NEGATIVE Final   Influenza B by PCR NEGATIVE NEGATIVE Final    Comment: (NOTE) The Xpert Xpress SARS-CoV-2/FLU/RSV plus assay is intended as an aid in the diagnosis of influenza  from Nasopharyngeal swab specimens and should not be used as a sole basis for treatment. Nasal washings and aspirates are unacceptable for Xpert Xpress SARS-CoV-2/FLU/RSV testing.  Fact Sheet for Patients: BloggerCourse.com  Fact Sheet for  Healthcare Providers: SeriousBroker.it  This test is not yet approved or cleared by the [place]  FDA and has been authorized for detection and/or diagnosis of SARS-CoV-2 by FDA under an Emergency Use Authorization (EUA). This EUA will remain in effect (meaning this test can be used) for the duration of the COVID-19 declaration under Section 564(b)(1) of the Act, 21 U.S.C. section 360bbb-3(b)(1), unless the authorization is terminated or revoked.     Resp Syncytial Virus by PCR NEGATIVE NEGATIVE Final    Comment: (NOTE) Fact Sheet for Patients: BloggerCourse.com  Fact Sheet for Healthcare Providers: SeriousBroker.it  This test is not yet approved or cleared by the [place]  FDA and has been authorized for detection and/or diagnosis of SARS-CoV-2 by FDA under an Emergency Use Authorization (EUA). This EUA will remain in effect (meaning this test can be used) for the duration of the COVID-19 declaration under Section 564(b)(1) of the Act, 21 U.S.C. section 360bbb-3(b)(1), unless the authorization is terminated or revoked.  Performed at [facility], [address] [place], [place] [address]      Radiological Exams on Admission:   Assessment/Plan Principal Problem:   Stroke [facility]) Active Problems:   Chronic obstructive pulmonary disease (HCC)   Chronic diastolic CHF (congestive heart failure) (HCC)   CAD (coronary artery disease)   HTN (hypertension)   HLD (hyperlipidemia)   Type 2 diabetes mellitus with diabetic chronic kidney disease (HCC)   Chronic kidney disease, stage 3b (HCC)   Depression with anxiety   Tobacco abuse   Overweight (BMI 25.0-29.9)   Assessment and Plan:  Stroke [facility]): MRI showed small acute infarcts in the left frontal lobe, right parietal lobe, and left cerebellum. MRA negative for LVO. Consulted Dr. Alecia Ames of neurology.  - Placed on tele bed for  observation - will hold oral Bp meds to allow permissive - continue home Plavix  and ASA  - Statin: Crestor  (pt is also on Zetia ) - fasting lipid panel and HbA1c  - 2D transthoracic echocardiography  - swallowing screen. If fails, will get SLP - Check UDS  - PT/OT consult  Chronic obstructive pulmonary disease [facility]): Patient reports mild shortness of breath, mild dry cough, oxygen  saturation 96% on home level 2 L oxygen .  No wheezing or rhonchi on auscultation.  Chest x-ray negative.  Does not seem to have COPD exacerbation.  Will discontinue CTA which was ordered by EDP. - Bronchodilators and as needed Mucinex  Chronic diastolic CHF (congestive heart failure) (HCC): 2D echo on 2/22 showed EF 60 to 65% with grade 1 diastolic dysfunction.  Patient does not have leg edema or JVD.  CHF seem to be compensated.  BNP 117. -Will check volume status closely  CAD (coronary artery disease): S/p of stent placement.  No chest pain - Continue aspirin , Plavix , Zetia , Crestor   HTN (hypertension) - prn IV hydralazine  for SBP>220 or dBP>110 - Hold felodipine , lisinopril , metoprolol   HLD (hyperlipidemia) -Zetia , Crestor   Type 2 diabetes mellitus with diabetic chronic kidney disease (HCC): Recent A1c 7.1, poorly controlled.  Patient is taking farxiga , semaglutide and the Lantus  22 units daily. - Sliding scale insulin  - Glargine insulin  11 units daily  Chronic kidney disease, stage 3b [facility]): Renal function stable.  Baseline creatinine 1.47 on 08/05/2023.  Her creatinine is 1.35, BUN 19, GFR  40. -continue finerenone  Depression with anxiety -Zoloft - Add Xanax  as needed 0.5, 3 times daily  Tobacco abuse -Nicotine  patch -Counseling about importance of quitting smoking  Overweight (BMI 25.0-29.9): Body weight 72.6 kg, BMI 25.82 - Encourage losing weight - Exercise healthy diet        DVT ppx: SQ Lovenox   Code Status: Full code   Family Communication:   Yes, patient's daughter at bed  side.  Disposition Plan:  Anticipate discharge back to previous environment  Consults called: Dr. Alecia Ames of neurology  Admission status and Level of care: Telemetry Medical:    for obs as inpt        Dispo: The patient is from: Home              Anticipated d/c is to: Home              Anticipated d/c date is: 1 day              Patient currently is not medically stable to d/c.    Severity of Illness:  The appropriate patient status for this patient is OBSERVATION. Observation status is judged to be reasonable and necessary in order to provide the required intensity of service to ensure the patient's safety. The patient's presenting symptoms, physical exam findings, and initial radiographic and laboratory data in the context of their medical condition is felt to place them at decreased risk for further clinical deterioration. Furthermore, it is anticipated that the patient will be medically stable for discharge from the hospital within 2 midnights of admission.        Date of Service 08/05/2023    Fidencio Hue [facility]   If 7PM-7AM, please contact night-coverage [email or web address] 08/05/2023, 8:32 PM

## 2023-08-06 ENCOUNTER — Observation Stay (HOSPITAL_BASED_OUTPATIENT_CLINIC_OR_DEPARTMENT_OTHER)
Admit: 2023-08-06 | Discharge: 2023-08-06 | Disposition: A | Discharge status: Home / Self Care | Attending: Internal Medicine | Admitting: Internal Medicine

## 2023-08-06 DIAGNOSIS — R29704 NIHSS score 4: Secondary | ICD-10-CM | POA: Diagnosis not present

## 2023-08-06 DIAGNOSIS — E663 Overweight: Secondary | ICD-10-CM | POA: Diagnosis not present

## 2023-08-06 DIAGNOSIS — N1832 Chronic kidney disease, stage 3b: Secondary | ICD-10-CM | POA: Diagnosis not present

## 2023-08-06 DIAGNOSIS — I6389 Other cerebral infarction: Secondary | ICD-10-CM | POA: Diagnosis not present

## 2023-08-06 DIAGNOSIS — E1151 Type 2 diabetes mellitus with diabetic peripheral angiopathy without gangrene: Secondary | ICD-10-CM

## 2023-08-06 DIAGNOSIS — Z7982 Long term (current) use of aspirin: Secondary | ICD-10-CM

## 2023-08-06 DIAGNOSIS — E785 Hyperlipidemia, unspecified: Secondary | ICD-10-CM | POA: Diagnosis not present

## 2023-08-06 DIAGNOSIS — Z7902 Long term (current) use of antithrombotics/antiplatelets: Secondary | ICD-10-CM | POA: Diagnosis not present

## 2023-08-06 DIAGNOSIS — I63542 Cerebral infarction due to unspecified occlusion or stenosis of left cerebellar artery: Secondary | ICD-10-CM | POA: Diagnosis not present

## 2023-08-06 DIAGNOSIS — I639 Cerebral infarction, unspecified: Secondary | ICD-10-CM | POA: Diagnosis not present

## 2023-08-06 LAB — GLUCOSE, CAPILLARY
Glucose-Capillary: 142 mg/dL — ABNORMAL HIGH (ref 70–99)
Glucose-Capillary: 201 mg/dL — ABNORMAL HIGH (ref 70–99)
Glucose-Capillary: 137 mg/dL — ABNORMAL HIGH (ref 70–99)
Glucose-Capillary: 129 mg/dL — ABNORMAL HIGH (ref 70–99)

## 2023-08-06 LAB — BASIC METABOLIC PANEL WITH GFR
Anion gap: 9 (ref 5–15)
BUN: 21 mg/dL (ref 8–23)
CO2: 25 mmol/L (ref 22–32)
Calcium: 9.6 mg/dL (ref 8.9–10.3)
Chloride: 105 mmol/L (ref 98–111)
Creatinine, Ser: 1.24 mg/dL — ABNORMAL HIGH (ref 0.44–1.00)
GFR, Estimated: 45 mL/min — ABNORMAL LOW (ref >60)
Glucose, Bld: 160 mg/dL — ABNORMAL HIGH (ref 70–99)
Potassium: 3.5 mmol/L (ref 3.5–5.1)
Sodium: 139 mmol/L (ref 135–145)

## 2023-08-06 LAB — LIPID PANEL
Cholesterol: 143 mg/dL (ref 0–200)
HDL: 48 mg/dL (ref >40)
LDL Cholesterol: 46 mg/dL (ref 0–99)
Total CHOL/HDL Ratio: 3 ratio
Triglycerides: 244 mg/dL — ABNORMAL HIGH (ref <150)
VLDL: 49 mg/dL — ABNORMAL HIGH (ref 0–40)

## 2023-08-06 LAB — HEMOGLOBIN A1C
Hgb A1c MFr Bld: 7.7 % — ABNORMAL HIGH (ref 4.8–5.6)
Mean Plasma Glucose: 174.29 mg/dL

## 2023-08-06 MED ORDER — IPRATROPIUM-ALBUTEROL 0.5-2.5 (3) MG/3ML IN SOLN
3.0000 mL | Freq: Two times a day (BID) | RESPIRATORY_TRACT | Status: DC
  Administered 2023-08-06 – 2023-08-07 (×2): 3 mL via RESPIRATORY_TRACT
  Filled 2023-08-06 (×2): qty 3

## 2023-08-06 NOTE — Progress Notes (Signed)
 PROGRESS NOTE    Hannah Kirby  WUJ:[id number] DOB: [birth year] DOA: 08/05/2023 PCP: Lamon Pillow, MD  Chief Complaint  Patient presents with   Weakness   Aphasia    Hospital Course:  Hannah Kirby is a 78 year old female with prior CVA, dysarthria, hypertension, hyperlipidemia, diabetes, COPD chronically on 2 L, GERD, CKD stage IIIb, CAD status post stenting, AAA, tobacco abuse, history of C. difficile colitis, who presents with weakness, poor balance, and dysarthria.  Labs in the ED mostly unremarkable.  Blood pressure found to be elevated.  CT head concerning for new CVA.  Patient was admitted for stroke workup.  Stroke was confirmed on MRI which revealed Small acute infarcts in the left frontal lobe, right parietal lobe, and left cerebellum.  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 Neurology was consulted.  Subjective: No acute events overnight.  This morning patient and her daughter are very upset.  They have many questions trying to better understand the etiology of this new CVA.  They report that they have been working hard to keep her blood pressure under better control and have been taking her medications as prescribed.   Objective: Vitals:   08/06/23 0342 08/06/23 0531 08/06/23 0800 08/06/23 1153  BP: (!) 159/80 (!) 152/84 (!) 152/67 (!) 142/81  Pulse: 71 68 71 75  Resp: 20 18 16 18   Temp: 98.7 F (37.1 C) 98.2 F (36.8 C) (!) 97.5 F (36.4 C) 97.9 F (36.6 C)  TempSrc: Oral Oral    SpO2: 100% 100% 98% 93%  Weight:      Height:        Intake/Output Summary (Last 24 hours) at 08/06/2023 1440 Last data filed at 08/05/2023 2300 Gross per 24 hour  Intake 250 ml  Output 20 ml  Net 230 ml   Filed Weights   08/05/23 1150  Weight: 72.6 kg    Examination: General exam: Appears calm and comfortable, tearful Respiratory system: No work of  breathing, symmetric chest wall expansion Cardiovascular system: S1 & S2 heard, RRR.  Gastrointestinal system: Abdomen is nondistended, soft and nontender.  Neuro: Alert and oriented. Extremities: Symmetric, expected ROM Skin: No rashes, lesions Psychiatry: Demonstrates appropriate judgement and insight. Mood & affect appropriate for situation.   Assessment & Plan:  Principal Problem:   Stroke [facility]) Active Problems:   Chronic obstructive pulmonary disease (HCC)   Chronic diastolic CHF (congestive heart failure) (HCC)   CAD (coronary artery disease)   HTN (hypertension)   HLD (hyperlipidemia)   Type 2 diabetes mellitus with diabetic chronic kidney disease (HCC)   Chronic kidney disease, stage 3b (HCC)   Depression with anxiety   Tobacco abuse   Overweight (BMI 25.0-29.9)   Acute CVA -Brain MRI confirms 3 new small infarcts in the left frontal lobe, right parietal lobe, left cerebellum.  Patient also has extensive small vessel disease and chronic occlusion of mid left M2 branch, and severe right A3 stenosis with moderate left P2 stenosis. - Neurology has been consulted - Stroke workup as below - Echocardiogram ordered and pending - Patient has previously received extensive workup in January.  At that time she had negative bubble study, and wore Holter monitor for 28 days which was uneventful - Will continue to monitor on telemetry here - Will consult cardiology to discuss possible loop recorder.  Atrial fibrillation remains high in the differential given multiple recurrent strokes in  different locations - LDL at goal, triglycerides elevated.  Continue high intensity statin - Hemoglobin A1c ordered and pending - Patient is chronically on 2 L O2.  Will evaluate on continuous pulse ox at night to eval for sleep apnea  Prior CVA - CVA in January 2025 lacunar infarct in the left posterior frontal lobe and left coronary - Continue aspirin , Plavix , statin.  Has previously had A-fib  workup -- PT/OT  HTN -- Normotensive BP goals now. Resume homemeds and titrate as needed   Type 2 diabetes - Continue sliding scale insulin  for now - Hemoglobin A1c ordered and pending  Hyperlipidemia - Triglycerides remain elevated - Continue statin, Zetia , fish oil   COPD Chronic respiratory failure on home oxygen  Chronic tobacco abuse - Chronically on 2 L O2 - Continue nicotine  patch  CKD stage IIIb - Currently near baseline - She follows with nephrology outpatient.  No acute concerns. - Avoid nephrotoxic medications - Renally dose with a creatinine clearance of 38 when needed  DVT prophylaxis: lovenox    Code Status: Full Code Disposition:  pending work up. Eventual DC to home with [facility]   Consultants:  Neurology  Procedures:    Antimicrobials:  Anti-infectives (From admission, onward)    None       Data Reviewed: I have personally reviewed following labs and imaging studies CBC: Recent Labs  Lab 08/05/23 1154  WBC 7.2  NEUTROABS 3.7  HGB 16.2*  HCT 50.9*  MCV 97.1  PLT 213   Basic Metabolic Panel: Recent Labs  Lab 08/05/23 1154 08/06/23 0452  NA 141 139  K 3.8 3.5  CL 104 105  CO2 26 25  GLUCOSE 192* 160*  BUN 19 21  CREATININE 1.35* 1.24*  CALCIUM  10.0 9.6   GFR: Estimated Creatinine Clearance: 38.7 mL/min (A) (by C-G formula based on SCr of 1.24 mg/dL (H)). Liver Function Tests: Recent Labs  Lab 08/05/23 1154  AST 24  ALT 18  ALKPHOS 87  BILITOT 0.7  PROT 7.0  ALBUMIN 3.4*   CBG: Recent Labs  Lab 08/05/23 2211 08/06/23 0756 08/06/23 1155  GLUCAP 147* 142* 201*    Recent Results (from the past 240 hours)  Resp panel by RT-PCR (RSV, Flu A&B, Covid) Anterior Nasal Swab     Status: None   Collection Time: 08/05/23  1:50 PM   Specimen: Anterior Nasal Swab  Result Value Ref Range Status   SARS Coronavirus 2 by RT PCR NEGATIVE NEGATIVE Final    Comment: (NOTE) SARS-CoV-2 target nucleic acids are NOT DETECTED.  The  SARS-CoV-2 RNA is generally detectable in upper respiratory specimens during the acute phase of infection. The lowest concentration of SARS-CoV-2 viral copies this assay can detect is 138 copies/mL. A negative result does not preclude SARS-Cov-2 infection and should not be used as the sole basis for treatment or other patient management decisions. A negative result may occur with  improper specimen collection/handling, submission of specimen other than nasopharyngeal swab, presence of viral mutation(s) within the areas targeted by this assay, and inadequate number of viral copies(<138 copies/mL). A negative result must be combined with clinical observations, patient history, and epidemiological information. The expected result is Negative.  Fact Sheet for Patients:  BloggerCourse.com  Fact Sheet for Healthcare Providers:  SeriousBroker.it  This test is no t yet approved or cleared by the [place]  FDA and  has been authorized for detection and/or diagnosis of SARS-CoV-2 by FDA under an Emergency Use Authorization (EUA). This EUA will remain  in effect (  meaning this test can be used) for the duration of the COVID-19 declaration under Section 564(b)(1) of the Act, 21 U.S.C.section 360bbb-3(b)(1), unless the authorization is terminated  or revoked sooner.       Influenza A by PCR NEGATIVE NEGATIVE Final   Influenza B by PCR NEGATIVE NEGATIVE Final    Comment: (NOTE) The Xpert Xpress SARS-CoV-2/FLU/RSV plus assay is intended as an aid in the diagnosis of influenza from Nasopharyngeal swab specimens and should not be used as a sole basis for treatment. Nasal washings and aspirates are unacceptable for Xpert Xpress SARS-CoV-2/FLU/RSV testing.  Fact Sheet for Patients: BloggerCourse.com  Fact Sheet for Healthcare Providers: SeriousBroker.it  This test is not yet approved or  cleared by the [place]  FDA and has been authorized for detection and/or diagnosis of SARS-CoV-2 by FDA under an Emergency Use Authorization (EUA). This EUA will remain in effect (meaning this test can be used) for the duration of the COVID-19 declaration under Section 564(b)(1) of the Act, 21 U.S.C. section 360bbb-3(b)(1), unless the authorization is terminated or revoked.     Resp Syncytial Virus by PCR NEGATIVE NEGATIVE Final    Comment: (NOTE) Fact Sheet for Patients: BloggerCourse.com  Fact Sheet for Healthcare Providers: SeriousBroker.it  This test is not yet approved or cleared by the [place]  FDA and has been authorized for detection and/or diagnosis of SARS-CoV-2 by FDA under an Emergency Use Authorization (EUA). This EUA will remain in effect (meaning this test can be used) for the duration of the COVID-19 declaration under Section 564(b)(1) of the Act, 21 U.S.C. section 360bbb-3(b)(1), unless the authorization is terminated or revoked.  Performed at [facility], [address], [place], [place] [address]      Radiology Studies: MR BRAIN WO CONTRAST Result Date: 08/05/2023 CLINICAL DATA:  Neuro deficit, acute, stroke suspected; Carotid artery aneurysm. Speech disturbance. Weakness. EXAM: MRI HEAD WITHOUT CONTRAST MRA HEAD WITHOUT CONTRAST MRA NECK WITHOUT AND WITH CONTRAST TECHNIQUE: Multiplanar, multi-echo pulse sequences of the brain and surrounding structures were acquired without intravenous contrast. Angiographic images of the Circle of Willis were acquired using MRA technique without intravenous contrast. Angiographic images of the neck were acquired using MRA technique without and with intravenous contrast. Carotid stenosis measurements (when applicable) are obtained utilizing NASCET criteria, using the distal internal carotid diameter as the denominator. CONTRAST:  7mL GADAVIST GADOBUTROL 1 MMOL/ML  IV SOLN COMPARISON:  Head CT 08/05/2023. Head MRI 03/17/2023. Head MRA 03/18/2023. FINDINGS: MRI HEAD FINDINGS Brain: There are small acute white matter infarcts in the posterior left frontal lobe and right parietal lobe, and there is also a punctate acute infarct in the left cerebellum at the posterior aspect of the middle cerebellar peduncle. Numerous chronic microhemorrhages are again seen in both cerebral hemispheres (including prominent deep gray nuclei involvement), brainstem, and cerebellum suggestive of chronic hypertension. Patchy to confluent T2 hyperintensities in the cerebral white matter bilaterally are similar to the prior MRI and are nonspecific but compatible with extensive chronic small vessel ischemic disease. Chronic lacunar infarcts are again noted in the bilateral cerebral white matter, deep gray nuclei, and pons. No mass, midline shift, or extra-axial fluid collection is identified. There is mild cerebral atrophy. Vascular: Major intracranial arterial flow voids are preserved. Skull and upper cervical spine: No suspicious marrow lesion. Advanced cervical facet arthrosis. Sinuses/Orbits: Bilateral cataract extraction. Mild mucosal thickening and fluid/secretions in the left maxillary sinus. Clear mastoid air cells. Other: None. MRA HEAD FINDINGS Anterior circulation: The internal carotid arteries are widely  patent from skull base to carotid termini. There is improved signal in the proximal left MCA compared to the prior MRA without evidence of a significant M1 stenosis currently. There are likely mild to moderate proximal left M2 stenoses. There is also occlusion of a mid left M2 branch vessel (series 15, image 105) which is unchanged from the 03/18/2023 MRA but is new from a 2018 study. The right MCA is patent with mild branch vessel irregularity but no evidence of a proximal branch occlusion or significant M1 stenosis. Both ACAs are patent proximally without evidence of a significant A1  stenosis. Severe stenosis or segmental occlusion involving the right A3 segment is unchanged from the most recent prior MRA. No aneurysm is identified. Posterior circulation: The included portions of the intracranial vertebral arteries are widely patent to the basilar. Patent PICA and SCA origins are visualized bilaterally. The basilar artery is widely patent. Posterior communicating arteries are diminutive or absent. Both PCAs are patent without evidence of a significant proximal stenosis on the right. There is a moderate proximal left P2 stenosis which appears progressed. No aneurysm is identified. Anatomic variants: None. MRA NECK FINDINGS Aortic arch: Standard branching. Widely patent brachiocephalic and subclavian arteries. Right carotid system: Patent without evidence of a significant stenosis or dissection. Left carotid system: Patent without evidence of a significant stenosis or dissection. Tortuous proximal ICA. Vertebral arteries: Patent with antegrade flow bilaterally and with the right being slightly dominant. Poor assessment of the left vertebral artery origin as this was at the very posterior aspect of the imaging volume. No evidence of a significant stenosis or dissection elsewhere on either side. IMPRESSION: 1. Small acute infarcts in the left frontal lobe, right parietal lobe, and left cerebellum. 2. Extensive chronic small vessel ischemic disease. 3. 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 4. Unchanged severe right A3 stenosis or segmental occlusion. 5. Moderate left P2 stenosis. 6. Limited assessment of the left vertebral artery origin, otherwise negative neck MRA. Electronically Signed   By: Aundra Lee M.D.   On: 08/05/2023 17:57   MR ANGIO HEAD WO CONTRAST Result Date: 08/05/2023 CLINICAL DATA:  Neuro deficit, acute, stroke suspected; Carotid artery aneurysm. Speech disturbance. Weakness. EXAM: MRI HEAD WITHOUT CONTRAST MRA HEAD WITHOUT CONTRAST MRA NECK WITHOUT AND WITH CONTRAST  TECHNIQUE: Multiplanar, multi-echo pulse sequences of the brain and surrounding structures were acquired without intravenous contrast. Angiographic images of the Circle of Willis were acquired using MRA technique without intravenous contrast. Angiographic images of the neck were acquired using MRA technique without and with intravenous contrast. Carotid stenosis measurements (when applicable) are obtained utilizing NASCET criteria, using the distal internal carotid diameter as the denominator. CONTRAST:  7mL GADAVIST GADOBUTROL 1 MMOL/ML IV SOLN COMPARISON:  Head CT 08/05/2023. Head MRI 03/17/2023. Head MRA 03/18/2023. FINDINGS: MRI HEAD FINDINGS Brain: There are small acute white matter infarcts in the posterior left frontal lobe and right parietal lobe, and there is also a punctate acute infarct in the left cerebellum at the posterior aspect of the middle cerebellar peduncle. Numerous chronic microhemorrhages are again seen in both cerebral hemispheres (including prominent deep gray nuclei involvement), brainstem, and cerebellum suggestive of chronic hypertension. Patchy to confluent T2 hyperintensities in the cerebral white matter bilaterally are similar to the prior MRI and are nonspecific but compatible with extensive chronic small vessel ischemic disease. Chronic lacunar infarcts are again noted in the bilateral cerebral white matter, deep gray nuclei, and pons. No mass, midline shift, or extra-axial fluid collection is  identified. There is mild cerebral atrophy. Vascular: Major intracranial arterial flow voids are preserved. Skull and upper cervical spine: No suspicious marrow lesion. Advanced cervical facet arthrosis. Sinuses/Orbits: Bilateral cataract extraction. Mild mucosal thickening and fluid/secretions in the left maxillary sinus. Clear mastoid air cells. Other: None. MRA HEAD FINDINGS Anterior circulation: The internal carotid arteries are widely patent from skull base to carotid termini. There is  improved signal in the proximal left MCA compared to the prior MRA without evidence of a significant M1 stenosis currently. There are likely mild to moderate proximal left M2 stenoses. There is also occlusion of a mid left M2 branch vessel (series 15, image 105) which is unchanged from the 03/18/2023 MRA but is new from a 2018 study. The right MCA is patent with mild branch vessel irregularity but no evidence of a proximal branch occlusion or significant M1 stenosis. Both ACAs are patent proximally without evidence of a significant A1 stenosis. Severe stenosis or segmental occlusion involving the right A3 segment is unchanged from the most recent prior MRA. No aneurysm is identified. Posterior circulation: The included portions of the intracranial vertebral arteries are widely patent to the basilar. Patent PICA and SCA origins are visualized bilaterally. The basilar artery is widely patent. Posterior communicating arteries are diminutive or absent. Both PCAs are patent without evidence of a significant proximal stenosis on the right. There is a moderate proximal left P2 stenosis which appears progressed. No aneurysm is identified. Anatomic variants: None. MRA NECK FINDINGS Aortic arch: Standard branching. Widely patent brachiocephalic and subclavian arteries. Right carotid system: Patent without evidence of a significant stenosis or dissection. Left carotid system: Patent without evidence of a significant stenosis or dissection. Tortuous proximal ICA. Vertebral arteries: Patent with antegrade flow bilaterally and with the right being slightly dominant. Poor assessment of the left vertebral artery origin as this was at the very posterior aspect of the imaging volume. No evidence of a significant stenosis or dissection elsewhere on either side. IMPRESSION: 1. Small acute infarcts in the left frontal lobe, right parietal lobe, and left cerebellum. 2. Extensive chronic small vessel ischemic disease. 3. Chronic occlusion  of a mid left M2 branch vessel. 4. Unchanged severe right A3 stenosis or segmental occlusion. 5. Moderate left P2 stenosis. 6. Limited assessment of the left vertebral artery origin, otherwise negative neck MRA. Electronically Signed   By: Aundra Lee M.D.   On: 08/05/2023 17:57   MR Angiogram Neck W or Wo Contrast Result Date: 08/05/2023 CLINICAL DATA:  Neuro deficit, acute, stroke suspected; Carotid artery aneurysm. Speech disturbance. Weakness. EXAM: MRI HEAD WITHOUT CONTRAST MRA HEAD WITHOUT CONTRAST MRA NECK WITHOUT AND WITH CONTRAST TECHNIQUE: Multiplanar, multi-echo pulse sequences of the brain and surrounding structures were acquired without intravenous contrast. Angiographic images of the Circle of Willis were acquired using MRA technique without intravenous contrast. Angiographic images of the neck were acquired using MRA technique without and with intravenous contrast. Carotid stenosis measurements (when applicable) are obtained utilizing NASCET criteria, using the distal internal carotid diameter as the denominator. CONTRAST:  7mL GADAVIST GADOBUTROL 1 MMOL/ML IV SOLN COMPARISON:  Head CT 08/05/2023. Head MRI 03/17/2023. Head MRA 03/18/2023. FINDINGS: MRI HEAD FINDINGS Brain: There are small acute white matter infarcts in the posterior left frontal lobe and right parietal lobe, and there is also a punctate acute infarct in the left cerebellum at the posterior aspect of the middle cerebellar peduncle. Numerous chronic microhemorrhages are again seen in both cerebral hemispheres (including prominent deep gray nuclei involvement), brainstem, and  cerebellum suggestive of chronic hypertension. Patchy to confluent T2 hyperintensities in the cerebral white matter bilaterally are similar to the prior MRI and are nonspecific but compatible with extensive chronic small vessel ischemic disease. Chronic lacunar infarcts are again noted in the bilateral cerebral white matter, deep gray nuclei, and pons. No mass,  midline shift, or extra-axial fluid collection is identified. There is mild cerebral atrophy. Vascular: Major intracranial arterial flow voids are preserved. Skull and upper cervical spine: No suspicious marrow lesion. Advanced cervical facet arthrosis. Sinuses/Orbits: Bilateral cataract extraction. Mild mucosal thickening and fluid/secretions in the left maxillary sinus. Clear mastoid air cells. Other: None. MRA HEAD FINDINGS Anterior circulation: The internal carotid arteries are widely patent from skull base to carotid termini. There is improved signal in the proximal left MCA compared to the prior MRA without evidence of a significant M1 stenosis currently. There are likely mild to moderate proximal left M2 stenoses. There is also occlusion of a mid left M2 branch vessel (series 15, image 105) which is unchanged from the 03/18/2023 MRA but is new from a 2018 study. The right MCA is patent with mild branch vessel irregularity but no evidence of a proximal branch occlusion or significant M1 stenosis. Both ACAs are patent proximally without evidence of a significant A1 stenosis. Severe stenosis or segmental occlusion involving the right A3 segment is unchanged from the most recent prior MRA. No aneurysm is identified. Posterior circulation: The included portions of the intracranial vertebral arteries are widely patent to the basilar. Patent PICA and SCA origins are visualized bilaterally. The basilar artery is widely patent. Posterior communicating arteries are diminutive or absent. Both PCAs are patent without evidence of a significant proximal stenosis on the right. There is a moderate proximal left P2 stenosis which appears progressed. No aneurysm is identified. Anatomic variants: None. MRA NECK FINDINGS Aortic arch: Standard branching. Widely patent brachiocephalic and subclavian arteries. Right carotid system: Patent without evidence of a significant stenosis or dissection. Left carotid system: Patent without  evidence of a significant stenosis or dissection. Tortuous proximal ICA. Vertebral arteries: Patent with antegrade flow bilaterally and with the right being slightly dominant. Poor assessment of the left vertebral artery origin as this was at the very posterior aspect of the imaging volume. No evidence of a significant stenosis or dissection elsewhere on either side. IMPRESSION: 1. Small acute infarcts in the left frontal lobe, right parietal lobe, and left cerebellum. 2. Extensive chronic small vessel ischemic disease. 3. 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 4. Unchanged severe right A3 stenosis or segmental occlusion. 5. Moderate left P2 stenosis. 6. Limited assessment of the left vertebral artery origin, otherwise negative neck MRA. Electronically Signed   By: Aundra Lee M.D.   On: 08/05/2023 17:57   DG Chest 2 View Result Date: 08/05/2023 CLINICAL DATA:  Increasing weakness and shortness of breath. EXAM: CHEST - 2 VIEW COMPARISON:  06/25/2023. FINDINGS: The heart is enlarged the mediastinal contour is within normal limits. There is atherosclerotic calcification aorta. Emphysematous changes are present in the lungs. Minimal subsegmental are atelectasis or scarring is noted in the mid left lung. No consolidation, effusion, or pneumothorax is seen. No acute osseous abnormality. IMPRESSION: No active cardiopulmonary disease. Electronically Signed   By: Wyvonnia Heimlich M.D.   On: 08/05/2023 13:28   CT HEAD WO CONTRAST Result Date: 08/05/2023 EXAM: CT HEAD WITHOUT CONTRAST 08/05/2023 12:22:00 PM TECHNIQUE: CT of the head was performed without the administration of intravenous contrast. Automated exposure control, iterative  reconstruction, and/or weight based adjustment of the mA/kV was utilized to reduce the radiation dose to as low as reasonably achievable. COMPARISON: MR head 03/17/2023 CLINICAL HISTORY: Stroke, follow up. Table formatting from the original note was not included. Images from the  original note were not included. Patient via POV from home. States that since Tuesday she has been having increased weakness and shortness of breath. Patient had a stroke in January. Daughter reports around 5pm yesterday she noticed she had increased generalized weakness, loss of balance. Patient stated also having a harder time finding her words but unable to establish last known well, patient poor historian. Patient has a history of COPD, patient is supposed to use 2L nasal cannula chronically but patient non-compliant. Patient is alert and oriented x4 and no acute distress. FINDINGS: BRAIN AND VENTRICLES: Moderate atrophy and white matter disease is again seen. Remote white matter infarcts are present in the corona radiata bilaterally. Remote lacunar infarcts are present in the right basal ganglia and thalami bilaterally. No acute intracranial hemorrhage, mass effect or midline shift. No abnormal extra-axial fluid collection. The gray-white differentiation is maintained without an acute infarct. There is no hydrocephalus. ORBITS: Bilateral lens replacements are noted. The globes and orbits are otherwise within normal limits. SINUSES: The visualized paranasal sinuses and mastoid air cells demonstrate no acute abnormality. SOFT TISSUES AND SKULL: No acute abnormality of the visualized skull or soft tissues. VASCULATURE: Atherosclerotic calcifications are present in the cavernous carotid arteries bilaterally and at the dural margin of both vertebral arteries. No hyperdense vessel is present. IMPRESSION: 1. No acute intracranial abnormality. 2. Moderate atrophy and white matter disease. 3. Remote white matter infarcts in the corona radiata bilaterally and remote lacunar infarcts in the right basal ganglia and thalami bilaterally. 4. Atherosclerotic calcifications in the cavernous carotid arteries bilaterally and at the dural margin of both vertebral arteries. No hyperdense vessel is present. Electronically signed by:  Audree Leas MD 08/05/2023 12:42 PM EDT RP Workstation: [id number]    Scheduled Meds:  aspirin  EC  81 mg Oral Daily   clopidogrel   75 mg Oral Daily   enoxaparin  (LOVENOX ) injection  40 mg Subcutaneous Q24H   ezetimibe   10 mg Oral Daily   Finerenone  10 mg Oral Q1400   insulin  aspart  0-5 Units Subcutaneous QHS   insulin  aspart  0-9 Units Subcutaneous TID WC   insulin  glargine-yfgn  11 Units Subcutaneous Daily   ipratropium-albuterol   3 mL Nebulization BID   nicotine   21 mg Transdermal Daily   pantoprazole   40 mg Oral Daily   rosuvastatin   40 mg Oral Daily   sertraline  25 mg Oral Daily   Continuous Infusions:   LOS: 0 days  MDM: Patient is high risk for one or more organ failure.  They necessitate ongoing hospitalization for continued IV therapies and subsequent lab monitoring. Total time spent interpreting labs and vitals, reviewing the medical record, coordinating care amongst consultants and care team members, directly assessing and discussing care with the patient and/or family: 55 min Ramatoulaye Pack, DO [facility]  To contact the attending physician between 7A-7P please use Epic Chat. To contact the covering physician during after hours 7P-7A, please review Amion.  08/06/2023, 2:40 PM   *This document has been created with the assistance of dictation software. Please excuse typographical errors. *

## 2023-08-06 NOTE — Evaluation (Signed)
 Physical Therapy Evaluation Patient Details Name: Hannah Kirby MRN: [id number] DOB: June 03, 1945 Today's Date: 08/06/2023  History of Present Illness  78 y.o. female who presents with weakness, poor balance, difficulty speaking. MRI findings:  Small acute infarcts in the left frontal lobe, right parietal lobe, and left cerebellum. PMHx significant of  stroke with dysarthria,  HTN, CKD stage IIIB, DM2 on insulin , CAD, chronic smoking, chronic respiratory failure with COPD and on home oxygen , anxiety, HLD  Clinical Impression  Patient seated in recliner upon arrival to session; supportive daughter present in room throughout session.  Patient alert and oriented to basic information; follows simple commands and agreeable to participation with session.  Intermittently tearful throughout session, generally frustrated with recurrent health issues.  Does endorse generalized, chronic soreness in back (FACES 2-4/10), unchanged with mobility or WBing efforts. Noted with baseline expressive aphasia; however, able to answer simple questions and effectively communicate basic wants/needs (often in 1-2 word responses) during session with increased time. L LE slightly weaker than baseline with isolated MMT (see details below); however, R LE with increased functional weakness in closed-chain, WBing activities.  Denies sensory deficit throughout bilat LEs.   Currently requiring min assist for sit/stand, standing balance and gait efforts (100') with RW.  Demonstrates partially reciprocal stepping pattern with inconsistent step height/lenght bilat; mildly antalgic with excessive weight shift to R LE throughout gait cycle due to post/lateral weakness in R hip (improved with manual facilitation). Patient endorses feeling generally weak and unsteady; prefers continued use of RW at this time  BERG score (24/56) indicative of significant balance deficits and high fall risk; do recommend continued use of RW and +1 assist for all  mobility efforts at this time. Patient/daughter voiced understanding and agreement. Would benefit from skilled PT to address above deficits and promote optimal return to PLOF.; recommend post-acute PT follow up as indicated by interdisciplinary care team.          If plan is discharge home, recommend the following: A little help with walking and/or transfers;A little help with bathing/dressing/bathroom   Can travel by private vehicle        Equipment Recommendations Rolling walker (2 wheels)  Recommendations for Other Services       Functional Status Assessment Patient has had a recent decline in their functional status and demonstrates the ability to make significant improvements in function in a reasonable and predictable amount of time.     Precautions / Restrictions Precautions Precautions: Fall Restrictions Weight Bearing Restrictions Per Provider Order: No      Mobility  Bed Mobility               General bed mobility comments: Up in recliner beginning/end of treatment session    Transfers Overall transfer level: Needs assistance Equipment used: Rolling walker (2 wheels) Transfers: Sit to/from Stand Sit to Stand: Min assist           General transfer comment: cuing for hand placement; increased time for initial stabilization with position change    Ambulation/Gait Ambulation/Gait assistance: Min assist Gait Distance (Feet): 100 Feet Assistive device: Rolling walker (2 wheels)         General Gait Details: partially reciprocal stepping pattern with inconsistent step height/lenght bilat; mildly antalgic with excessive weight shift to R LE throughout gait cycle due to post/lateral weakness in R hip (improved with manual facilitation).  Patient endorses feeling generally weak and unsteady; prefers continued use of RW at this time  Stairs  Wheelchair Mobility     Tilt Bed    Modified Rankin (Stroke Patients Only)       Balance  Overall balance assessment: Needs assistance Sitting-balance support: No upper extremity supported, Feet supported Sitting balance-Leahy Scale: Good     Standing balance support: No upper extremity supported Standing balance-Leahy Scale: Poor                   Standardized Balance Assessment Standardized Balance Assessment : Berg Balance Test Berg Balance Test Sit to Stand: Able to stand  independently using hands Standing Unsupported: Able to stand 2 minutes with supervision Sitting with Back Unsupported but Feet Supported on Floor or Stool: Able to sit safely and securely 2 minutes Stand to Sit: Controls descent by using hands Transfers: Needs one person to assist Standing Unsupported with Eyes Closed: Able to stand 10 seconds with supervision Standing Ubsupported with Feet Together: Needs help to attain position but able to stand for 30 seconds with feet together From Standing, Reach Forward with Outstretched Arm: Reaches forward but needs supervision From Standing Position, Pick up Object from Floor: Able to pick up shoe, needs supervision From Standing Position, Turn to Look Behind Over each Shoulder: Turn sideways only but maintains balance Turn 360 Degrees: Needs assistance while turning Standing Unsupported, Alternately Place Feet on Step/Stool: Needs assistance to keep from falling or unable to try Standing Unsupported, One Foot in Front: Loses balance while stepping or standing Standing on One Leg: Unable to try or needs assist to prevent fall Total Score: 24         Pertinent Vitals/Pain Pain Assessment Pain Assessment: No/denies pain    Home Living Family/patient expects to be discharged to:: Private residence Living Arrangements: Spouse/significant other Available Help at Discharge: Family;Available PRN/intermittently Type of Home: House Home Access: Stairs to enter Entrance Stairs-Rails: None Entrance Stairs-Number of Steps: 1 garage, 2 from front door    Home Layout: One level Home Equipment: Cane - single point;Rollator (4 wheels);Rolling Walker (2 wheels)      Prior Function Prior Level of Function : Driving;History of Falls (last six months);Independent/Modified Independent             Mobility Comments: initially HHA from family as needed, then went to no AD over last 2-3 months; no falls; going to outpatient downstairs ADLs Comments: IND, was cooking dinner standing in the kitchen     Extremity/Trunk Assessment   Upper Extremity Assessment Upper Extremity Assessment: Overall WFL for tasks assessed;Right hand dominant;RUE deficits/detail RUE Deficits / Details: very mild R hand weakness noted and R shoulder deficits at baseline from RTC issues    Lower Extremity Assessment Lower Extremity Assessment: RLE deficits/detail;LLE deficits/detail RLE Deficits / Details: R LE grossly 4 to 4+/5 with isolated testing, but notable post/lateral hip weakness in closed-chain activities.  Denies sensory deficit LLE Deficits / Details: L LE grossly 4- to 4/5 with isolated testing.  Denies sensory deficit       Communication   Communication Communication: Impaired Factors Affecting Communication: Difficulty expressing self    Cognition Arousal: Alert Behavior During Therapy: WFL for tasks assessed/performed, Flat affect, Lability (tearful at times (frustrated with recurrent health issues))   PT - Cognitive impairments: No apparent impairments                         Following commands: Intact       Cueing Cueing Techniques: Verbal cues     General Comments General  comments (skin integrity, edema, etc.): placed on RA for session and dropped to 88%, replaced 2L to improve to 90%    Exercises Other Exercises Other Exercises: 5x sit/stand without assist device: 44 seconds.  Does require use of bilat UEs to assist with lift off and for eccentric control with stand/sit 25' with HHA, min/mod assist-broader, choppier  stepping pattern; excessive weight shift/instability bilat; frequently reaching for additional stabilization as able.  Patient very hesitant/uncertain and prefers continued use of RW   Assessment/Plan    PT Assessment Patient needs continued PT services  PT Problem List Decreased strength;Decreased activity tolerance;Decreased range of motion;Decreased balance;Decreased mobility;Decreased coordination;Decreased knowledge of use of DME;Decreased safety awareness;Decreased knowledge of precautions       PT Treatment Interventions DME instruction;Gait training;Stair training;Functional mobility training;Therapeutic activities;Therapeutic exercise;Balance training;Neuromuscular re-education;Patient/family education    PT Goals (Current goals can be found in the Care Plan section)  Acute Rehab PT Goals Patient Stated Goal: to get better again PT Goal Formulation: With patient/family Time For Goal Achievement: 08/20/23 Potential to Achieve Goals: Good Additional Goals Additional Goal #1: Improve BERG 5-7 points for improved safety/indep with functional activities and ADLs    Frequency Min 3X/week     Co-evaluation               AM-PAC PT 6 Clicks Mobility  Outcome Measure Help needed turning from your back to your side while in a flat bed without using bedrails?: A Little Help needed moving from lying on your back to sitting on the side of a flat bed without using bedrails?: A Little Help needed moving to and from a bed to a chair (including a wheelchair)?: A Little Help needed standing up from a chair using your arms (e.g., wheelchair or bedside chair)?: A Little Help needed to walk in hospital room?: A Little Help needed climbing 3-5 steps with a railing? : A Lot 6 Click Score: 17    End of Session Equipment Utilized During Treatment: Gait belt Activity Tolerance: Patient tolerated treatment well Patient left: in chair;with call bell/phone within reach;with chair alarm  set;with family/visitor present Nurse Communication: Mobility status PT Visit Diagnosis: Hemiplegia and hemiparesis Hemiplegia - Right/Left: Right Hemiplegia - dominant/non-dominant: Dominant Hemiplegia - caused by: Cerebral infarction    Time: 0950-1017 PT Time Calculation (min) (ACUTE ONLY): 27 min   Charges:   PT Evaluation $PT Eval Moderate Complexity: 1 Mod   PT General Charges $$ ACUTE PT VISIT: 1 Visit        Odena Mcquaid H. Bevin Bucks, PT, DPT, NCS 08/06/23, 11:52 AM [phone]

## 2023-08-06 NOTE — Progress Notes (Signed)
 SLP Cancellation Note  Patient Details Name: Hannah Kirby MRN: [id number] DOB: 1945-12-15   Cancelled treatment:          Per chart, pt is a 78 year old female with prior CVA, cognitive-communication deficits and dysarthria(ongoing Outpatient Speech Therapy w/ recent tx session 08/03/2023), hypertension, hyperlipidemia, diabetes, COPD chronically on 2 L, GERD, CKD stage IIIb, CAD status post stenting, AAA, tobacco abuse, history of C. difficile colitis, who presents with weakness, poor balance; [name] indicated pt's speech is about the same as before.  Labs in the ED mostly unremarkable.  Blood pressure found to be elevated.   CT head concerning for new CVA.  Patient was admitted for stroke workup.   Stroke was confirmed on MRI which revealed small acute infarcts in the left frontal lobe, right parietal lobe, and left cerebellum. 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   In meeting in room w/ pt, then speaking on the phone w/ Daughter, pt's verbal communication for wants/needs/thoughts is described as at her Baseline. Pt was talking on the phone w/ her Dtr upon entering room, she described her weakness that brought her to the hospital, she identified meal items she wanted and did not want on her lunch tray, she stated her desire to go home today w/ some Lability, and she identified [name] as her person of contact. Pt used strategies of slowing down and calming her breathing (when Labile, anxious) to correct her speech errors. Pt described that she did this and other strategies during Outpatient Speech therapy- also identified her therapist's name and that they worked together on pt's communication device that helps me w/ my communication.  Pt was Labile and anxious re: desire to go home today. Listened and offered support to pt. MD/NSG and Dtr made  aware. Dtr stated she hopes that pt will take her medication for anxiety w/ NSG to help w/ the anxiousness and mood.  After a discussion w/ both pt and Daughter, then MD/NSG, this SLP decided to Hold on cognitive-linguistic assessment and instead recommended pt continue her f/u and Outpatient Speech therapy. Dtr agreed stating again that pt seemed at her Baseline for her communication abilities today. MD agreed.  Recommend pt continue her f/u w/ skilled ST services Outpatient post D/C. NSG updated.     Darla Edward, MS, CCC-SLP Speech Language Pathologist Rehab Services; [facility] [phone] (ascom) Aris Even 08/06/2023, 3:24 PM

## 2023-08-06 NOTE — Progress Notes (Signed)
 Echocardiogram 2D Echocardiogram has been performed.  Hannah Kirby 08/06/2023, 3:43 PM

## 2023-08-06 NOTE — Care Management Obs Status (Signed)
 MEDICARE OBSERVATION STATUS NOTIFICATION   Patient Details  Name: Hannah Kirby MRN: [id number] Date of Birth: Aug 22, 1945   Medicare Observation Status Notification Given:  Yes    Marino Sias, RN 08/06/2023, 11:49 AM

## 2023-08-06 NOTE — Consult Note (Signed)
 NEUROLOGY CONSULT NOTE   Date of service: August 06, 2023 Patient Name: Hannah Kirby MRN:  [id number] DOB:  [birth year] Chief Complaint: Generalized weakness Requesting Provider: Roise Cleaver, DO  History of Present Illness  Hannah Kirby is a 78 y.o. female with hx of previous stroke with speech difficulty presents with generalized weakness that started on Friday.  Due to the symptoms she sought care in the emergency department yesterday where an MRI was performed showing multifocal small embolic strokes.  LKW: Friday  NIHSS components Score: Comment  1a Level of Conscious 0[x]  1[]  2[]  3[]      1b LOC Questions 0[x]  1[]  2[]       1c LOC Commands 0[x]  1[]  2[]       2 Best Gaze 0[x]  1[]  2[]       3 Visual 0[x]  1[]  2[]  3[]      4 Facial Palsy 0[]  1[x]  2[]  3[]      5a Motor Arm - left 0[]  1[x]  2[]  3[]  4[]  UN[]    5b Motor Arm - Right 0[x]  1[]  2[]  3[]  4[]  UN[]    6a Motor Leg - Left 0[x]  1[]  2[]  3[]  4[]  UN[]    6b Motor Leg - Right 0[x]  1[]  2[]  3[]  4[]  UN[]    7 Limb Ataxia 0[x]  1[]  2[]  UN[]      8 Sensory 0[x]  1[]  2[]  UN[]      9 Best Language 0[]  1[]  2[]  3[]      10 Dysarthria 0[]  1[x]  2[]  UN[]      11 Extinct. and Inattention 0[x]  1[]  2[]       TOTAL: 4      Past History   Past Medical History:  Diagnosis Date   Acute hemorrhagic colitis 08/08/2019   Anemia    Anxiety    Arthritis    C. difficile colitis 08/09/2019   CAD (coronary artery disease)    a. 02/2006 PCI: BMS x 2 to RCA, cath o/w without significant coronary disease; b. nuclear stress test 07/2014: No ischemia/infarct; c. 11/2017 MV: no isch/infarct, EF 55-65%; d. 03/2020 NSTEMI/PCI: LM nl, LAD min irregs, RI 25, small, LCX nl, RCA 30p/m ISR, 99d (3.0x15 Resolute Onyx DES).   Cerebrovascular accident (CVA) (HCC) 03/18/2023   dysarthria   Chronic bronchitis (HCC)    secondary to cigarette smoking   Chronic kidney disease (CKD), stage III (moderate) (HCC)    COPD (chronic obstructive pulmonary disease) (HCC)    Diabetes  mellitus    Diastolic dysfunction    a. 07/2019 Echo: EF 60-65%, no rwma, mod LVH. Nl RV size/fxn; b. 03/2020 Echo: EF 60-65%, mod LVH, gr1 DD, nl RV size/fxn, mild AS.   FHx: allergies    GERD (gastroesophageal reflux disease)    Goiter    Granulomatous disease (HCC)    Hernia    Hyperlipidemia    Hypertension    Kidney stone on left side 2013   Microalbuminuria    NSTEMI (non-ST elevated myocardial infarction) (HCC) 03/23/2020   Obesity    Panic attacks    PVC's (premature ventricular contractions)    a. 03/2018 Zio: Occas PVCs (2.5%). Triggered events assoc w/ PVC/PAC.   Retinal tear 2020   Smokers' cough (HCC)    Spinal stenosis    Stroke (HCC) 10/29/2016   mild left side weakness   Stroke (HCC) 08/01/2019   Tobacco abuse     Past Surgical History:  Procedure Laterality Date   ABDOMINAL HYSTERECTOMY     BREAST SURGERY     CATARACT EXTRACTION W/PHACO Right 03/01/2017   Procedure: CATARACT EXTRACTION  PHACO AND INTRAOCULAR LENS PLACEMENT (IOC) RIGHT DIABETIC;  Surgeon: Annell Kidney, MD;  Location: [facility];  Service: Ophthalmology;  Laterality: Right;   CATARACT EXTRACTION W/PHACO Left 03/22/2017   Procedure: CATARACT EXTRACTION PHACO AND INTRAOCULAR LENS PLACEMENT (IOC) LEFT DIABETIC;  Surgeon: Annell Kidney, MD;  Location: [facility];  Service: Ophthalmology;  Laterality: Left;  Diabetic - insulin  and oral meds   COLONOSCOPY WITH PROPOFOL  N/A 09/23/2014   Procedure: COLONOSCOPY WITH PROPOFOL ;  Surgeon: Deveron Fly, MD;  Location: [facility] ENDOSCOPY;  Service: Endoscopy;  Laterality: N/A;   COLONOSCOPY WITH PROPOFOL  N/A 01/11/2018   Procedure: COLONOSCOPY WITH PROPOFOL ;  Surgeon: Deveron Fly, MD;  Location: [facility] ENDOSCOPY;  Service: Endoscopy;  Laterality: N/A;   COLONOSCOPY WITH PROPOFOL  N/A 04/24/2018   Procedure: COLONOSCOPY WITH PROPOFOL ;  Surgeon: Deveron Fly, MD;  Location: [facility] ENDOSCOPY;  Service: Endoscopy;  Laterality: N/A;    COLONOSCOPY WITH PROPOFOL  N/A 11/19/2019   Procedure: COLONOSCOPY WITH PROPOFOL ;  Surgeon: Marnee Sink, MD;  Location: [facility] ENDOSCOPY;  Service: Endoscopy;  Laterality: N/A;   CORONARY ANGIOPLASTY WITH STENT PLACEMENT  2008   CORONARY STENT INTERVENTION N/A 03/24/2020   Procedure: CORONARY STENT INTERVENTION;  Surgeon: Sammy Crisp, MD;  Location: [facility] INVASIVE CV LAB;  Service: Cardiovascular;  Laterality: N/A;   ESOPHAGOGASTRODUODENOSCOPY (EGD) WITH PROPOFOL  N/A 12/30/2014   Procedure: ESOPHAGOGASTRODUODENOSCOPY (EGD) WITH PROPOFOL ;  Surgeon: Deveron Fly, MD;  Location: [facility] ENDOSCOPY;  Service: Endoscopy;  Laterality: N/A;   ESOPHAGOGASTRODUODENOSCOPY (EGD) WITH PROPOFOL  N/A 07/19/2016   Procedure: ESOPHAGOGASTRODUODENOSCOPY (EGD) WITH PROPOFOL ;  Surgeon: Deveron Fly, MD;  Location: [facility];  Service: Endoscopy;  Laterality: N/A;   ESOPHAGOGASTRODUODENOSCOPY (EGD) WITH PROPOFOL  N/A 04/24/2018   Procedure: ESOPHAGOGASTRODUODENOSCOPY (EGD) WITH PROPOFOL ;  Surgeon: Deveron Fly, MD;  Location: [facility] ENDOSCOPY;  Service: Endoscopy;  Laterality: N/A;   hysterectomy (other)     LEFT HEART CATH AND CORONARY ANGIOGRAPHY N/A 03/24/2020   Procedure: LEFT HEART CATH AND CORONARY ANGIOGRAPHY;  Surgeon: Sammy Crisp, MD;  Location: [facility] INVASIVE CV LAB;  Service: Cardiovascular;  Laterality: N/A;    Family History: Family History  Problem Relation Age of Onset   Heart failure Mother    Stroke Mother    Diabetes Mother    Congestive Heart Failure Mother    Lung cancer Father    Breast cancer Maternal Aunt     Social History  reports that she has been smoking cigarettes. She started smoking about 56 years ago. She has a 112.9 pack-year smoking history. She has never used smokeless tobacco. She reports current alcohol use. She reports that she does not use drugs.  Allergies  Allergen Reactions   Spironolactone  Shortness Of Breath   Sulfa Antibiotics Other (See  Comments) and Rash    Mouth blisters  Welts on mouth   Sulfonamide Derivatives Other (See Comments)    Last taken as a child; made her mouth break out   Other Other (See Comments)    Mouth blisters   Codeine Nausea And Vomiting and Other (See Comments)    Nausea   Prednisone  Palpitations and Other (See Comments)    Made her legs feel weird.    Medications   Current Facility-Administered Medications:    acetaminophen  (TYLENOL ) tablet 650 mg, 650 mg, Oral, Q4H PRN **OR** acetaminophen  (TYLENOL ) 160 MG/5ML solution 650 mg, 650 mg, Per Tube, Q4H PRN **OR** acetaminophen  (TYLENOL ) suppository 650 mg, 650 mg, Rectal, Q4H PRN, Niu, Xilin, MD   albuterol  (PROVENTIL ) (2.5 MG/3ML) 0.083%  nebulizer solution 2.5 mg, 2.5 mg, Inhalation, Q4H PRN, Niu, Xilin, MD   ALPRAZolam  (XANAX ) tablet 0.5 mg, 0.5 mg, Oral, TID PRN, Niu, Xilin, MD   aspirin  EC tablet 81 mg, 81 mg, Oral, Daily, Niu, Xilin, MD, 81 mg at 08/06/23 1012   clopidogrel  (PLAVIX ) tablet 75 mg, 75 mg, Oral, Daily, Niu, Xilin, MD, 75 mg at 08/06/23 1013   dextromethorphan-guaiFENesin (MUCINEX DM) 30-600 MG per 12 hr tablet 1 tablet, 1 tablet, Oral, BID PRN, Niu, Xilin, MD   enoxaparin  (LOVENOX ) injection 40 mg, 40 mg, Subcutaneous, Q24H, Niu, Xilin, MD, 40 mg at 08/05/23 2213   ezetimibe  (ZETIA ) tablet 10 mg, 10 mg, Oral, Daily, Niu, Xilin, MD   Finerenone TABS 10 mg, 10 mg, Oral, Q1400, Niu, Xilin, MD   hydrALAZINE  (APRESOLINE ) injection 5 mg, 5 mg, Intravenous, Q2H PRN, Niu, Xilin, MD   HYDROcodone -acetaminophen  (NORCO) 7.5-325 MG per tablet 1 tablet, 1 tablet, Oral, Q6H PRN, Niu, Xilin, MD   insulin  aspart (novoLOG ) injection 0-5 Units, 0-5 Units, Subcutaneous, QHS, Niu, Xilin, MD   insulin  aspart (novoLOG ) injection 0-9 Units, 0-9 Units, Subcutaneous, TID WC, Niu, Xilin, MD, 1 Units at 08/06/23 0820   insulin  glargine-yfgn (SEMGLEE ) injection 11 Units, 11 Units, Subcutaneous, Daily, Niu, Xilin, MD   ipratropium-albuterol  (DUONEB)  0.5-2.5 (3) MG/3ML nebulizer solution 3 mL, 3 mL, Nebulization, BID, Dezii, Alexandra, DO   nicotine  (NICODERM CQ  - dosed in mg/24 hours) patch 21 mg, 21 mg, Transdermal, Daily, Niu, Xilin, MD, 21 mg at 08/05/23 2213   nitroGLYCERIN  (NITROSTAT ) SL tablet 0.4 mg, 0.4 mg, Sublingual, Q5 min PRN, Niu, Xilin, MD   ondansetron  (ZOFRAN ) injection 4 mg, 4 mg, Intravenous, Q8H PRN, Niu, Xilin, MD   pantoprazole  (PROTONIX ) EC tablet 40 mg, 40 mg, Oral, Daily, Niu, Xilin, MD, 40 mg at 08/06/23 1013   rosuvastatin  (CRESTOR ) tablet 40 mg, 40 mg, Oral, Daily, Niu, Xilin, MD, 40 mg at 08/06/23 1012   senna-docusate (Senokot-S) tablet 1 tablet, 1 tablet, Oral, QHS PRN, Niu, Xilin, MD   sertraline (ZOLOFT) tablet 25 mg, 25 mg, Oral, Daily, Elisabeth Guild, NP, 25 mg at 08/06/23 1013  Vitals   Vitals:   08/06/23 0342 08/06/23 0531 08/06/23 0800 08/06/23 1153  BP: (!) 159/80 (!) 152/84 (!) 152/67 (!) 142/81  Pulse: 71 68 71 75  Resp: 20 18 16 18   Temp: 98.7 F (37.1 C) 98.2 F (36.8 C) (!) 97.5 F (36.4 C) 97.9 F (36.6 C)  TempSrc: Oral Oral    SpO2: 100% 100% 98% 93%  Weight:      Height:        Body mass index is 25.82 kg/m.   Physical Exam   Constitutional: Appears well-developed and well-nourished.   Neurologic Examination    Neuro: Mental Status: Patient is awake, alert, oriented to person, place, month, year, and situation. Patient is able to give a clear and coherent history. She does have mild expressive aphasia Cranial Nerves: II: Visual Fields are full. Pupils are equal, round, and reactive to light.   III,IV, VI: EOMI without ptosis or diploplia.  V: Facial sensation is symmetric to temperature VII: Facial movement with mild left facial weakness VIII: hearing is intact to voice X: Uvula elevates symmetrically XII: tongue is midline without atrophy or fasciculations.  Motor: She has mild left arm weakness, symmetric strength in the legs Sensory: Sensation is symmetric  to light touch and temperature in the arms and legs. Cerebellar: Does not perform   Labs/Imaging/Neurodiagnostic studies   CBC:  Recent Labs  Lab 08/05/23 1154  WBC 7.2  NEUTROABS 3.7  HGB 16.2*  HCT 50.9*  MCV 97.1  PLT 213   Basic Metabolic Panel:  Lab Results  Component Value Date   NA 139 08/06/2023   K 3.5 08/06/2023   CO2 25 08/06/2023   GLUCOSE 160 (H) 08/06/2023   BUN 21 08/06/2023   CREATININE 1.24 (H) 08/06/2023   CALCIUM  9.6 08/06/2023   GFRNONAA 45 (L) 08/06/2023   GFRAA 55 (L) 03/31/2020   Lipid Panel:  Lab Results  Component Value Date   LDLCALC 46 08/06/2023   HgbA1c:  Lab Results  Component Value Date   HGBA1C 7.1 (H) 03/17/2023   Urine Drug Screen:     Component Value Date/Time   LABOPIA NONE DETECTED 03/17/2023 0752   COCAINSCRNUR NONE DETECTED 03/17/2023 0752   LABBENZ POSITIVE (A) 03/17/2023 0752   AMPHETMU NONE DETECTED 03/17/2023 0752   THCU NONE DETECTED 03/17/2023 0752   LABBARB NONE DETECTED 03/17/2023 0752    Alcohol Level     Component Value Date/Time   [facility] <15 08/05/2023 1154   INR  Lab Results  Component Value Date   INR 1.0 08/05/2023   APTT  Lab Results  Component Value Date   APTT 30 08/05/2023     MRI Brain(Personally reviewed): Small multifocal infarcts.   ASSESSMENT   Hannah Kirby is a 78 y.o. female with multifocal small infarcts.  The locations of these infarcts, could be suggestive of concurrent small vessel disease, but with four that are bilateral, I do think an embolic workup is necessary.  She is already on dual antiplatelet therapy, and I would continue this.  RECOMMENDATIONS  Continue aspirin  81 mg and Plavix  85 mg daily Continue current lipid management as her LDL is at goal More aggressive diabetes management as her A1c on June 6 was 7.7, goal less than seven She is now more than 2 days out from symptoms, and therefore we can begin lowering her blood pressure gently over the next 1 to 2  weeks for goal of normotension. Echocardiogram If no evidence of embolic source on echocardiogram, would favor pursuing an implanted loop recorder.  She is extremely hesitant to remain hospitalized, and therefore this could be pursued as an outpatient. Neurology will be available as needed.  ______________________________________________________________________    Flint Hummer, MD [facility]

## 2023-08-06 NOTE — Evaluation (Signed)
 Occupational Therapy Evaluation Patient Details Name: Hannah Kirby MRN: [id number] DOB: [birth year] Today's Date: 08/06/2023   History of Present Illness   78 y.o. female who presents with weakness, poor balance, difficulty speaking. MRI findings:  Small acute infarcts in the left frontal lobe, right parietal lobe, and left cerebellum. PMHx significant of  stroke with dysarthria,  HTN, CKD stage IIIB, DM2 on insulin , CAD, chronic smoking, chronic respiratory failure with COPD and on home oxygen , anxiety, HLD     Clinical Impressions Pt was seen for OT evaluation this date. PTA, pt resides at home with her husband in a 1 level home with 1 STE. Daughter provides intermittent assist as needed and takes pt to MD appts and outpatient therapy visits. Pt had progressed to ambulating without AD at home and was MOD I/IND with ADL performance and IADLs such as meal prep.   Pt presents to acute OT demonstrating impaired ADL performance and functional mobility 2/2 mild weakness, balance deficits and low activity tolerance. Alert and oriented x4. Speech/aphasia baseline deficits. Pt found seated on BSC, currently requires CGA for STS from commode to RW and for standing peri-care. LB dressing performed with CGA/SBA to don mesh underwear via figure four and STS. Able to ambulate ~10 feet in the room using RW with CGA and no LOB noted, as well as stand at sink with no UE support to wash face and hands. Pt returned to recliner at end of session and per daughter is anxious to get back home.  Pt would benefit from skilled OT services to address noted impairments and functional limitations to maximize safety and independence while minimizing falls risk and caregiver burden. Do anticipate the need for follow up OT services upon acute hospital DC.      If plan is discharge home, recommend the following:   A little help with walking and/or transfers;A little help with bathing/dressing/bathroom;Help with stairs or  ramp for entrance;Assistance with cooking/housework     Functional Status Assessment   Patient has had a recent decline in their functional status and demonstrates the ability to make significant improvements in function in a reasonable and predictable amount of time.     Equipment Recommendations   None recommended by OT     Recommendations for Other Services         Precautions/Restrictions   Precautions Precautions: Fall Recall of Precautions/Restrictions: Intact Restrictions Weight Bearing Restrictions Per Provider Order: No     Mobility Bed Mobility               General bed mobility comments: NT up on BSC on entry    Transfers Overall transfer level: Needs assistance Equipment used: Rolling walker (2 wheels) Transfers: Sit to/from Stand Sit to Stand: Contact guard assist           General transfer comment: cues for hand placement, able to stand and ambulate within the room using RW with CGA and no LOB noted      Balance Overall balance assessment: Needs assistance Sitting-balance support: Feet supported Sitting balance-Leahy Scale: Good Sitting balance - Comments: steady seated on BSC   Standing balance support: During functional activity, No upper extremity supported, Single extremity supported Standing balance-Leahy Scale: Fair Standing balance comment: no LOB during session, utilized RW for stability with ambulation                           ADL either performed or assessed with clinical judgement  ADL Overall ADL's : Needs assistance/impaired     Grooming: Wash/dry hands;Wash/dry face;Standing;Contact guard assist;Supervision/safety Grooming Details (indicate cue type and reason): for balance at sink with RW             Lower Body Dressing: Supervision/safety;Contact guard assist;Sit to/from stand;Sitting/lateral leans Lower Body Dressing Details (indicate cue type and reason): don mesh underwear Toilet Transfer:  BSC/3in1;Supervision/safety;Contact guard assist Toilet Transfer Details (indicate cue type and reason): for STS from [facility] Toileting- Clothing Manipulation and Hygiene: Contact guard assist;Sit to/from stand Toileting - Clothing Manipulation Details (indicate cue type and reason): peri-care after BM     Functional mobility during ADLs: Contact guard assist;Rolling walker (2 wheels)       Vision         Perception         Praxis         Pertinent Vitals/Pain       Extremity/Trunk Assessment Upper Extremity Assessment Upper Extremity Assessment: Overall WFL for tasks assessed;Right hand dominant;RUE deficits/detail RUE Deficits / Details: very mild R hand weakness noted and R shoulder deficits at baseline from RTC issues   Lower Extremity Assessment Lower Extremity Assessment: Defer to PT evaluation       Communication Communication Communication: Impaired Factors Affecting Communication: Difficulty expressing self   Cognition Arousal: Alert Behavior During Therapy: WFL for tasks assessed/performed, Flat affect Cognition: No apparent impairments             OT - Cognition Comments: states it is 6025 -per dtr normal since CVA in Jan 2025                 Following commands: Intact       Cueing  General Comments   Cueing Techniques: Verbal cues  placed on RA for session and dropped to 88%, replaced 2L to improve to 90%   Exercises Other Exercises Other Exercises: Edu on role of OT in acute setting and DC recommendations.   Shoulder Instructions      Home Living Family/patient expects to be discharged to:: Private residence Living Arrangements: Spouse/significant other Available Help at Discharge: Family;Available PRN/intermittently Type of Home: House Home Access: Stairs to enter [employer] of Steps: 1 garage, 2 from front door Entrance Stairs-Rails: None Home Layout: One level     Bathroom Shower/Tub: Tub/shower unit;Walk-in  shower   Bathroom Toilet: Handicapped height     Home Equipment: Cane - single point;Rollator (4 wheels);Rolling Walker (2 wheels)          Prior Functioning/Environment               Mobility Comments: initially HHA from family as needed, then went to no AD over last 2-3 months; no falls; going to outpatient downstairs ADLs Comments: IND, was cooking dinner standing in the kitchen    OT Problem List: Decreased strength;Decreased activity tolerance;Impaired balance (sitting and/or standing)   OT Treatment/Interventions: Self-care/ADL training;Therapeutic exercise;Therapeutic activities;Patient/family education;Balance training;Energy conservation;DME and/or AE instruction      OT Goals(Current goals can be found in the care plan section)   Acute Rehab OT Goals Patient Stated Goal: return home OT Goal Formulation: With patient/family Time For Goal Achievement: 08/20/23 Potential to Achieve Goals: Good ADL Goals Pt Will Perform Lower Body Bathing: with supervision;sitting/lateral leans;sit to/from stand Pt Will Perform Lower Body Dressing: with supervision;sit to/from stand;sitting/lateral leans Pt Will Transfer to Toilet: with supervision;ambulating;regular height toilet Additional ADL Goal #1: Pt will demo simulated IADLs, e.g. standing at sink/counter to simulate preparing a  meal with supervision and no LOB.   OT Frequency:  Min 2X/week    Co-evaluation              AM-PAC OT 6 Clicks Daily Activity     Outcome Measure Help from another person eating meals?: None Help from another person taking care of personal grooming?: None Help from another person toileting, which includes using toliet, bedpan, or urinal?: A Little Help from another person bathing (including washing, rinsing, drying)?: A Little Help from another person to put on and taking off regular upper body clothing?: None Help from another person to put on and taking off regular lower body  clothing?: A Little 6 Click Score: 21   End of Session Equipment Utilized During Treatment: Rolling walker (2 wheels) Nurse Communication: Mobility status  Activity Tolerance: Patient tolerated treatment well Patient left: in chair;with call bell/phone within reach;with chair alarm set;with family/visitor present  OT Visit Diagnosis: Other abnormalities of gait and mobility (R26.89);Muscle weakness (generalized) (M62.81)                Time: 4742-5956 OT Time Calculation (min): 24 min Charges:  OT General Charges $OT Visit: 1 Visit OT Evaluation $OT Eval Moderate Complexity: 1 Mod OT Treatments $Self Care/Home Management : 8-22 mins Melora Menon, OTR/L  08/06/23, 10:30 AM  Sallee Hogrefe E Trayveon Beckford 08/06/2023, 10:27 AM

## 2023-08-07 ENCOUNTER — Other Ambulatory Visit

## 2023-08-07 ENCOUNTER — Other Ambulatory Visit (INDEPENDENT_AMBULATORY_CARE_PROVIDER_SITE_OTHER): Payer: Self-pay

## 2023-08-07 ENCOUNTER — Telehealth: Payer: Self-pay

## 2023-08-07 DIAGNOSIS — N1832 Chronic kidney disease, stage 3b: Secondary | ICD-10-CM | POA: Diagnosis not present

## 2023-08-07 DIAGNOSIS — Z720 Tobacco use: Secondary | ICD-10-CM | POA: Diagnosis not present

## 2023-08-07 DIAGNOSIS — I2510 Atherosclerotic heart disease of native coronary artery without angina pectoris: Secondary | ICD-10-CM

## 2023-08-07 DIAGNOSIS — I5032 Chronic diastolic (congestive) heart failure: Secondary | ICD-10-CM | POA: Diagnosis not present

## 2023-08-07 DIAGNOSIS — I639 Cerebral infarction, unspecified: Secondary | ICD-10-CM

## 2023-08-07 DIAGNOSIS — E785 Hyperlipidemia, unspecified: Secondary | ICD-10-CM | POA: Diagnosis not present

## 2023-08-07 DIAGNOSIS — E663 Overweight: Secondary | ICD-10-CM | POA: Diagnosis not present

## 2023-08-07 DIAGNOSIS — I63542 Cerebral infarction due to unspecified occlusion or stenosis of left cerebellar artery: Secondary | ICD-10-CM | POA: Diagnosis not present

## 2023-08-07 LAB — URINALYSIS, W/ REFLEX TO CULTURE (INFECTION SUSPECTED)
Bacteria, UA: NONE SEEN
Bilirubin Urine: NEGATIVE
Glucose, UA: 150 mg/dL — AB
Hgb urine dipstick: NEGATIVE
Ketones, ur: NEGATIVE mg/dL
Nitrite: NEGATIVE
Protein, ur: 100 mg/dL — AB
Specific Gravity, Urine: 1.019 (ref 1.005–1.030)
pH: 5 (ref 5.0–8.0)

## 2023-08-07 LAB — GLUCOSE, CAPILLARY
Glucose-Capillary: 148 mg/dL — ABNORMAL HIGH (ref 70–99)
Glucose-Capillary: 118 mg/dL — ABNORMAL HIGH (ref 70–99)
Glucose-Capillary: 125 mg/dL — ABNORMAL HIGH (ref 70–99)

## 2023-08-07 LAB — ECHOCARDIOGRAM COMPLETE
AR max vel: 2.73 cm2
AV Peak grad: 5.3 mmHg
Ao pk vel: 1.15 m/s
Area-P 1/2: 3.5 cm2
Height: 66 in
S' Lateral: 2.8 cm
Weight: 2560 [oz_av]

## 2023-08-07 LAB — URINE DRUG SCREEN, QUALITATIVE (ARMC ONLY)
Amphetamines, Ur Screen: NOT DETECTED
Barbiturates, Ur Screen: NOT DETECTED
Benzodiazepine, Ur Scrn: POSITIVE — AB
Cannabinoid 50 Ng, Ur ~~LOC~~: NOT DETECTED
Cocaine Metabolite,Ur ~~LOC~~: NOT DETECTED
MDMA (Ecstasy)Ur Screen: NOT DETECTED
Methadone Scn, Ur: NOT DETECTED
Opiate, Ur Screen: POSITIVE — AB
Phencyclidine (PCP) Ur S: NOT DETECTED
Tricyclic, Ur Screen: NOT DETECTED

## 2023-08-07 MED ORDER — LISINOPRIL 20 MG PO TABS
20.0000 mg | ORAL_TABLET | Freq: Every day | ORAL | Status: DC
  Administered 2023-08-07: 20 mg via ORAL
  Filled 2023-08-07: qty 1

## 2023-08-07 NOTE — TOC Transition Note (Signed)
 Transition of Care [facility]) - Discharge Note   Patient Details  Name: Hannah Kirby MRN: [id number] Date of Birth: 12-12-1945  [facility] CM/SW Contact:  Crayton Docker, RN Phone Number: 08/07/2023, 4:25 PM   Clinical Narrative:     Patient to discharge to home with [facility] for home health PT/OT. [employer] will deliver portable oxygen  tank prior to discharge. CM call to patient's phone: [phone] regarding home health, and oxygen  delivery to patient's room. Patient declined rolling walker recommendation. Per patient, patient's husband, and daughter will provide transportation home   Final next level of care: Home w Home Health Services Barriers to Discharge: No Barriers Identified   Patient Goals and CMS Choice    Home with home health PT/OT  Discharge Placement         Home with home health PT/OT        Discharge Plan and Services Additional resources added to the After Visit Summary for      [facility] Agency: [facility] Date [facility] Agency Contacted: 08/07/23 Time HH Agency Contacted: 1355 Representative spoke with at [facility] Agency: Bartholomew Light  Social Drivers of Health (SDOH) Interventions SDOH Screenings   Food Insecurity: No Food Insecurity (08/06/2023)  Housing: Low Risk  (08/06/2023)  Transportation Needs: No Transportation Needs (08/06/2023)  Utilities: Not At Risk (08/06/2023)  Alcohol Screen: Low Risk  (03/28/2023)  Depression (PHQ2-9): High Risk (07/24/2023)  Financial Resource Strain: Low Risk  (03/28/2023)  Physical Activity: Inactive (03/28/2023)  Social Connections: Unknown (08/06/2023)  Stress: Stress Concern Present (03/28/2023)  Tobacco Use: High Risk (08/05/2023)  Health Literacy: Adequate Health Literacy (03/28/2023)     Readmission Risk Interventions     No data to display

## 2023-08-07 NOTE — TOC Initial Note (Signed)
 Transition of Care [facility]) - Initial/Assessment Note    Patient Details  Name: Hannah Kirby MRN: [id number] Date of Birth: 1945-07-07  Transition of Care [facility]) CM/SW Contact:    Crayton Docker, RN Phone Number: 08/07/2023, 3:57 PM  Clinical Narrative:                  CM to patient's room regarding TOC screening assessment. CM explained case management role and discharge care planning. Patient verbalized understanding and agreement. Patient's husband, Drexel Gentles and daughter, Anell Baptist at patient's bedside. Patient  lives with husband and 1 dog. Per patient uses a walker, rollator, cane and wheelchair. CM and patient/patient's family discussed home health PT/OT recommendations. Per patient prefers [facility] Patient uses oxygen  with [facility]. Patient does not have portable tank. Per patient's daughter, portable tank does not work properly. CM call to [facility], phone: [phone] regarding home health referral. Per [facility], agency has accepted patient for home health and will begin to follow. CM all to [facility], phone: [phone] regarding [facility] delivering a portable oxygen  tank. CM spoke [place]. Per [place], call transferred to [facility] for delivery of portable oxygen .  Expected Discharge Plan: Home w Home Health Services Barriers to Discharge: No Barriers Identified   Patient Goals and CMS Choice    Home Health PT/OT/[facility]   Expected Discharge Plan and Services    Home health PT/OT/[facility]    Living arrangements for the past 2 months: Single Family Home         HH Agency: [facility] Date [facility] Agency Contacted: 08/07/23 Time HH Agency Contacted: [id number] Representative spoke with at [facility] Agency: [facility]  Prior Living Arrangements/Services Living arrangements for the past 2 months: Single Family Home Lives with:: Spouse, Pets (1 dog--vaccinated) Patient language and need for interpreter reviewed:: No Do you feel safe going  back to the place where you live?: Yes      Need for Family Participation in Patient Care: Yes (Comment) Care giver support system in place?: Yes (comment) Current home services: DME (walker, wheelchair, cane, rollator,) Criminal Activity/Legal Involvement Pertinent to Current Situation/Hospitalization: No - Comment as needed  Activities of Daily Living   ADL Screening (condition at time of admission) Independently performs ADLs?: Yes (appropriate for developmental age) Is the patient deaf or have difficulty hearing?: No Does the patient have difficulty seeing, even when wearing glasses/contacts?: No Does the patient have difficulty concentrating, remembering, or making decisions?: Yes  Permission Sought/Granted Permission sought to share information with : Case Manager, Family Supports Permission granted to share information with : Yes, Verbal Permission Granted  Share Information with NAME: Drexel Gentles Albin/Dena Lepak/Betty Alvena Aurora     Permission granted to share info w Relationship: Husband/Daughter/Sister  Permission granted to share info w Contact Information: yes  Emotional Assessment Appearance:: Appears stated age Attitude/Demeanor/Rapport: Engaged   Orientation: : Oriented to Self, Oriented to Place, Oriented to  Time Alcohol / Substance Use: Tobacco Use (Last cigarette Saturday 08/05/2023)    Admission diagnosis:  Weakness [R53.1] Stroke [facility]) [I63.9] Cerebrovascular accident (CVA), unspecified mechanism (HCC) [I63.9] Patient Active Problem List   Diagnosis Date Noted   Stroke (HCC) 08/05/2023   Chronic diastolic CHF (congestive heart failure) (HCC) 08/05/2023   Depression with anxiety 08/05/2023   Chronic kidney disease, stage 3b (HCC) 08/05/2023   Tobacco abuse 08/05/2023   Overweight (BMI 25.0-29.9) 08/05/2023   HLD (hyperlipidemia) 08/05/2023   CAD (coronary artery disease) 08/05/2023   HTN (hypertension) 08/05/2023  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) 03/27/2023   Dysarthria 03/17/2023   Chronic obstructive pulmonary disease (HCC) 03/17/2023   Dizziness 02/23/2023   Dehydration 02/20/2023   Restrictive airway disease 07/21/2020   Partial thickness rotator cuff tear 03/20/2020   Shoulder pain 03/05/2020   Retinal detachment, left 03/04/2020   Ataxia 02/28/2020   Difficulty walking 02/28/2020   Physical deconditioning 02/28/2020   Chronic diastolic congestive heart failure (HCC) 11/20/2019   Polyp of ascending colon    Hypokalemia 08/08/2019   TIA (transient ischemic attack) 08/01/2019   Benign hypertensive kidney disease with chronic kidney disease 04/25/2019   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   Chronic, continuous use of opioids 03/26/2018   History of adenomatous polyp of colon 01/22/2018   Ulceration of colon determined by endoscopy 01/22/2018   Diverticulosis of colon 01/22/2018   History of CVA (cerebrovascular accident) 11/08/2016   Weakness of left upper extremity 10/30/2016   AAA (abdominal aortic aneurysm) without rupture (HCC) 08/12/2016   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   Stage 3b chronic kidney disease (HCC) 07/27/2015   Chest pain at rest 05/06/2015   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HCC) 10/28/2014   Solitary pulmonary nodule on lung CT 08/20/2014   Allergic rhinitis 07/28/2014   CAFL (chronic airflow limitation) (HCC) 07/28/2014   Acid reflux 07/28/2014   Granuloma annulare    Back pain 11/12/2013   Lumbar scoliosis 10/30/2013   Lumbar radiculopathy 10/30/2013   Lumbar canal stenosis 10/30/2013   Calcium  blood increased 10/22/2013   Proteinuria 10/22/2013   Obesity 03/21/2011   Smokes with greater than 30 pack year history 03/21/2011   Edema 08/18/2010   Hyperlipidemia 03/25/2009   Coronary artery disease 03/25/2009   Primary hypertension  03/25/2009   PCP:  Lamon Pillow, MD Pharmacy:   [facility] - [place], [place] - [address] [facility] [address] [place] [address] Phone: [phone] Fax: [phone]  [id number] [place], [place] - [address] [place] [address] Phone: [phone] Fax: [phone]  [id number] [place], [place] - [address] AT [facility] [address] [place] [address] Phone: [phone] Fax: [phone]     Social Drivers of Health (SDOH) Social History: SDOH Screenings   Food Insecurity: No Food Insecurity (08/06/2023)  Housing: Low Risk  (08/06/2023)  Transportation Needs: No Transportation Needs (08/06/2023)  Utilities: Not At Risk (08/06/2023)  Alcohol Screen: Low Risk  (03/28/2023)  Depression (PHQ2-9): High Risk (07/24/2023)  Financial Resource Strain: Low Risk  (03/28/2023)  Physical Activity: Inactive (03/28/2023)  Social Connections: Unknown (08/06/2023)  Stress: Stress Concern Present (03/28/2023)  Tobacco Use: High Risk (08/05/2023)  Health Literacy: Adequate Health Literacy (03/28/2023)   SDOH Interventions:     Readmission Risk Interventions     No data to display

## 2023-08-07 NOTE — Telephone Encounter (Signed)
 14 day Zio AT placed as requested by Suzann Riddle, NP

## 2023-08-07 NOTE — Discharge Summary (Signed)
 Physician Discharge Summary   Patient: Hannah Kirby MRN: [id number] DOB: [birth year]  Admit date:     08/05/2023  Discharge date: 08/07/23  Discharge Physician: Roise Cleaver   PCP: Lamon Pillow, MD   Recommendations at discharge:   Follow-up with primary care for close monitoring and medication titration of hypertension, diabetes, COPD management Follow-up with EP in clinic for ILR placement Follow-up with neurology for stroke management  Discharge Diagnoses: Principal Problem:   Stroke [facility]) Active Problems:   Chronic obstructive pulmonary disease (HCC)   Chronic diastolic CHF (congestive heart failure) (HCC)   CAD (coronary artery disease)   HTN (hypertension)   HLD (hyperlipidemia)   Type 2 diabetes mellitus with diabetic chronic kidney disease (HCC)   Chronic kidney disease, stage 3b (HCC)   Depression with anxiety   Tobacco abuse   Overweight (BMI 25.0-29.9)  Resolved Problems:   * No resolved hospital problems. *  Hospital Course: Hannah Kirby is a 78 year old female with prior CVA, dysarthria, hypertension, hyperlipidemia, diabetes, COPD chronically on 2 L, GERD, CKD stage IIIb, CAD status post stenting, AAA, tobacco abuse, history of C. difficile colitis, who presents with weakness, poor balance, and dysarthria.  Labs in the ED mostly unremarkable.  Blood pressure found to be elevated.  CT head concerning for new CVA.  Patient was admitted.  Stroke was confirmed on MRI which revealed Small acute infarcts in the left frontal lobe, right parietal lobe, and left cerebellum.  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 Neurology was consulted.  Patient was monitored on telemetry, no evidence of atrial fibrillation.  Echocardiogram was performed which revealed preserved EF, mild left ventricular hypertrophy, grade 1 diastolic dysfunction.  Moderate  thickening of the aortic valve. Given location of CVA, this appears most consistent with embolic etiology.  Patient does have significant stenosis which may be contributing.  Electrophysiology was consulted to assist patient with implantable loop recorder.  She had a Zio patch placed prior to discharge and will follow-up in the EP clinic for ILR next week.  In the interim she will need to continue with aspirin , Plavix , statin.  We also discussed extensively the importance of diabetes and blood pressure control.  Multiple discussions with the family on day of discharge explaining the pathophysiology of this disease and expected outcomes. TOC was consulted and home health arrangements were made prior to discharge.  O2 was delivered at bedside.  Acute CVA -Brain MRI confirms 3 new small infarcts in the left frontal lobe, right parietal lobe, left cerebellum.  Patient also has extensive small vessel disease and chronic occlusion of mid left M2 branch, and severe right A3 stenosis with moderate left P2 stenosis. - Neurology has been consulted - Stroke workup as below - Echocardiogram: Preserved EF, grade 1 diastolic dysfunction.  Mild left ventricular hypertrophy, moderate thickening of aortic valve. - Patient has previously received extensive workup in January.  At that time she had negative bubble study, and wore Holter monitor for 28 days which was uneventful - No evidence on telemetry while admitted - EP consulted.  Zio patch placed.  Planning for ILR placement next week in the clinic - Atrial fibrillation remains high in the differential - LDL at goal, triglycerides elevated.  Continue high intensity statin - Hemoglobin A1c above goal.  Have discussed this with patient. - Patient is chronically on 2 L O2.  Could benefit from outpatient  sleep study to rule out OSA - CVA in January 2025 lacunar infarct in the left posterior frontal lobe and left coronary - Continue aspirin , Plavix , statin - Home  health PT/OT  HTN -- Normotensive BP goals now. Resume homemeds and titrate as needed - Close outpatient follow-up with PCP   Type 2 diabetes - Hemoglobin A1c 7.7.  Patient's daughter reports it has been difficult to get her mother to comply with diabetic diet.  Discussed the importance of tight diabetes control given her recurrent CVAs  Hyperlipidemia - Triglycerides remain elevated - Continue statin, Zetia , fish oil    COPD Chronic respiratory failure on home oxygen  Chronic tobacco abuse - Chronically on 2 L O2.  Patient reports O2 tank is not working.  New O2 was delivered prior to discharge - Continue nicotine  patch   CKD stage IIIb - Currently near baseline - She follows with nephrology outpatient.  No acute concerns. - Avoid nephrotoxic medications - Renally dose with a creatinine clearance of 38 when needed    PDMP reviewed. Consultants: Neurology, electrophysiology Procedures performed: N/a Disposition: Home health Diet recommendation:  Discharge Diet Orders (From admission, onward)     Start     Ordered   08/07/23 0000  Diet general        08/07/23 1718           Cardiac and Carb modified diet DISCHARGE MEDICATION: Allergies as of 08/07/2023       Reactions   Spironolactone  Shortness Of Breath   Sulfa Antibiotics Other (See Comments), Rash   Mouth blisters Welts on mouth   Sulfonamide Derivatives Other (See Comments)   Last taken as a child; made her mouth break out   Other Other (See Comments)   Mouth blisters   Codeine Nausea And Vomiting, Other (See Comments)   Nausea   Prednisone  Palpitations, Other (See Comments)   Made her legs feel weird.        Medication List     STOP taking these medications    mirtazapine  7.5 MG tablet Commonly known as: REMERON        TAKE these medications    acetaminophen  500 MG tablet Commonly known as: TYLENOL  Take 500 mg by mouth every 6 (six) hours as needed.   albuterol  108 (90 Base) MCG/ACT  inhaler Commonly known as: Ventolin  HFA Inhale 2 puffs into the lungs every 4 (four) hours as needed for wheezing or shortness of breath.   Aspirin  81 81 MG tablet Generic drug: aspirin  EC Take 81 mg by mouth daily.   benzonatate  100 MG capsule Commonly known as: TESSALON  Take 1 capsule (100 mg total) by mouth every 8 (eight) hours.   calcium  carbonate 500 MG chewable tablet Commonly known as: TUMS - dosed in mg elemental calcium  Chew 1 tablet by mouth daily as needed.   clopidogrel  75 MG tablet Commonly known as: Plavix  Take 1 tablet (75 mg total) by mouth daily.   cyanocobalamin  1000 MCG tablet Take 1,000 mcg by mouth daily.   Daily Multi Tabs Take 1 tablet by mouth daily.   dapagliflozin  propanediol 5 MG Tabs tablet Commonly known as: Farxiga  Take 1 tablet (5 mg total) by mouth daily before breakfast. What changed:  how much to take when to take this   DSS 100 MG Caps Take 1 capsule by mouth daily.   ezetimibe  10 MG tablet Commonly known as: ZETIA  TAKE ONE TABLET BY MOUTH EVERY DAY   felodipine  5 MG 24 hr tablet Commonly known as: PLENDIL  TAKE  1 TABLET BY MOUTH DAILY   Fish Oil  1000 MG Caps Take 2 capsules by mouth in the morning and at bedtime.   HYDROcodone -acetaminophen  7.5-325 MG tablet Commonly known as: NORCO Take 1 tablet by mouth every 6 (six) hours as needed for moderate pain (pain score 4-6).   insulin  glargine 100 UNIT/ML injection Commonly known as: LANTUS  Inject 22 Units into the skin daily.   Kerendia 10 MG Tabs Generic drug: Finerenone Take 10 mg by mouth.   lisinopril  20 MG tablet Commonly known as: ZESTRIL  Take 1 tablet (20 mg total) by mouth daily.   meclizine  25 MG tablet Commonly known as: ANTIVERT  Take 25 mg by mouth 3 (three) times daily as needed.   metoprolol  tartrate 100 MG tablet Commonly known as: LOPRESSOR  Take 1 tablet (100 mg total) by mouth 2 (two) times daily.   nitroGLYCERIN  0.4 MG SL tablet Commonly known as:  Nitrostat  Place 1 tablet (0.4 mg total) under the tongue every 5 (five) minutes as needed.   ondansetron  4 MG disintegrating tablet Commonly known as: Zofran  ODT Take 1 tablet (4 mg total) by mouth every 8 (eight) hours as needed for nausea or vomiting.   Ozempic (1 MG/DOSE) 4 MG/3ML Sopn Generic drug: Semaglutide (1 MG/DOSE) Inject 1 mg into the skin once a week.   RABEprazole  20 MG tablet Commonly known as: ACIPHEX  Take 1 tablet (20 mg total) by mouth daily. 15 Mins. before evening meal   rosuvastatin  40 MG tablet Commonly known as: CRESTOR  Take 1 tablet (40 mg total) by mouth daily.   sertraline 25 MG tablet Commonly known as: ZOLOFT Take 25 mg by mouth daily.   sucralfate  1 g tablet Commonly known as: CARAFATE  Take 1 tablet (1 g total) by mouth 2 (two) times daily. 15 Minutes before evening meal and at bed time   Trelegy Ellipta  100-62.5-25 MCG/ACT Aepb Generic drug: Fluticasone -Umeclidin-Vilant INHALE 1 PUFF BY MOUTH EVERY DAY - DISCARD DEVICE 6 WEEKS AFTER IT IS REMOVED FROM THE FOIL TRAY OR WHEN THE DOSE COUNTER READS &quot;0&quot; (WHICHEVER COMES FIRST)        Follow-up Information     [facility] Follow up.   Why: 06/16---Per Burleigh Carp, will have delivery tech delivery portable tank to patient's room. Contact information: [address] [place] [address] [phone]         Care, [facility] Follow up.   Why: 06/16--Per [facility] will begin to follow for home healthPT/OT Contact information: [address] [place] [address] [phone]                Discharge Exam: Cleavon Curls Weights   08/05/23 1150  Weight: 72.6 kg   General exam: Appears calm and comfortable, tearful Respiratory system: No work of breathing, symmetric chest wall expansion Cardiovascular system: S1 & S2 heard, RRR.  Gastrointestinal system: Abdomen is nondistended, soft and nontender.  Neuro: Alert and oriented. Extremities: Symmetric,  expected ROM Skin: No rashes, lesions Psychiatry: Demonstrates appropriate judgement and insight. Mood & affect appropriate for situation.   Condition at discharge: stable  The results of significant diagnostics from this hospitalization (including imaging, microbiology, ancillary and laboratory) are listed below for reference.   Imaging Studies: ECHOCARDIOGRAM COMPLETE Result Date: 08/07/2023    ECHOCARDIOGRAM REPORT   Patient Name:   LEITHA HYPPOLITE Kiedrowski Date of Exam: 08/06/2023 Medical Rec #:  [id number]        Height:       66.0 in Accession #:  [id number]       Weight:       160.0 lb Date of Birth:  [birth year]        BSA:          1.819 m Patient Age:    78 years         BP:           152/67 mmHg Patient Gender: F                HR:           79 bpm. Exam Location:  [facility] Procedure: 2D Echo, Cardiac Doppler and Color Doppler (Both Spectral and Color            Flow Doppler were utilized during procedure). Indications:     Stroke I63.9  History:         Patient has prior history of Echocardiogram examinations, most                  recent 03/23/2023. CHF, CAD, Stroke, TIA, COPD and CKD, stage 3,                  Signs/Symptoms:Chest Pain; Risk Factors:Hypertension, Diabetes,                  Dyslipidemia and Current Smoker.  Sonographer:     Terrilee Few RCS Referring Phys:  [id number] XILIN NIU Diagnosing Phys: Sammy Crisp MD IMPRESSIONS  1. Left ventricular ejection fraction, by estimation, is 65 to 70%. The left ventricle has normal function. Left ventricular endocardial border not optimally defined to evaluate regional wall motion. There is mild left ventricular hypertrophy. Left ventricular diastolic parameters are consistent with Grade I diastolic dysfunction (impaired relaxation).  2. Right ventricular systolic function is normal. The right ventricular size is normal. Tricuspid regurgitation signal is inadequate for assessing PA pressure.  3. The mitral valve is grossly normal. No evidence of mitral  valve regurgitation. No evidence of mitral stenosis.  4. The aortic valve is tricuspid. There is mild calcification of the aortic valve. There is moderate thickening of the aortic valve. Aortic valve regurgitation is not visualized. Aortic valve sclerosis/calcification is present, without any evidence of aortic stenosis.  5. The inferior vena cava is normal in size with <50% respiratory variability, suggesting right atrial pressure of 8 mmHg. FINDINGS  Left Ventricle: Left ventricular ejection fraction, by estimation, is 65 to 70%. The left ventricle has normal function. Left ventricular endocardial border not optimally defined to evaluate regional wall motion. The left ventricular internal cavity size was normal in size. There is mild left ventricular hypertrophy. Left ventricular diastolic parameters are consistent with Grade I diastolic dysfunction (impaired relaxation). Right Ventricle: The right ventricular size is normal. No increase in right ventricular wall thickness. Right ventricular systolic function is normal. Tricuspid regurgitation signal is inadequate for assessing PA pressure. Left Atrium: Left atrial size was normal in size. Right Atrium: Right atrial size was normal in size. Pericardium: There is no evidence of pericardial effusion. Mitral Valve: The mitral valve is grossly normal. Mild mitral annular calcification. No evidence of mitral valve regurgitation. No evidence of mitral valve stenosis. Tricuspid Valve: The tricuspid valve is not well visualized. Tricuspid valve regurgitation is not demonstrated. Aortic Valve: The aortic valve is tricuspid. There is mild calcification of the aortic valve. There is moderate thickening of the aortic valve. Aortic valve regurgitation is not visualized. Aortic valve sclerosis/calcification is present, without any evidence of aortic stenosis. Aortic valve  peak gradient measures 5.3 mmHg. Pulmonic Valve: The pulmonic valve was not well visualized. Pulmonic valve  regurgitation is trivial. No evidence of pulmonic stenosis. Aorta: The aortic root and ascending aorta are structurally normal, with no evidence of dilitation. Pulmonary Artery: The pulmonary artery is not well seen. Venous: The inferior vena cava is normal in size with less than 50% respiratory variability, suggesting right atrial pressure of 8 mmHg. IAS/Shunts: The interatrial septum was not well visualized.  LEFT VENTRICLE PLAX 2D LVIDd:         4.60 cm   Diastology LVIDs:         2.80 cm   LV e' medial:    6.42 cm/s LV PW:         1.00 cm   LV E/e' medial:  8.1 LV IVS:        1.10 cm   LV e' lateral:   6.31 cm/s LVOT diam:     2.10 cm   LV E/e' lateral: 8.2 LV SV:         59 LV SV Index:   32 LVOT Area:     3.46 cm  RIGHT VENTRICLE             IVC RV S prime:     21.10 cm/s  IVC diam: 1.80 cm TAPSE (M-mode): 2.4 cm LEFT ATRIUM             Index        RIGHT ATRIUM           Index LA diam:        3.90 cm 2.14 cm/m   RA Area:     16.60 cm LA Vol (A2C):   35.3 ml 19.41 ml/m  RA Volume:   44.40 ml  24.41 ml/m LA Vol (A4C):   32.0 ml 17.59 ml/m LA Biplane Vol: 36.4 ml 20.01 ml/m  AORTIC VALVE AV Area (Vmax): 2.73 cm AV Vmax:        115.00 cm/s AV Peak Grad:   5.3 mmHg LVOT Vmax:      90.70 cm/s LVOT Vmean:     56.600 cm/s LVOT VTI:       0.169 m  AORTA Ao Root diam: 3.10 cm Ao Asc diam:  3.20 cm MITRAL VALVE MV Area (PHT): 3.50 cm    SHUNTS MV Decel Time: 217 msec    Systemic VTI:  0.17 m MV E velocity: 51.90 cm/s  Systemic Diam: 2.10 cm MV A velocity: 96.40 cm/s MV E/A ratio:  0.54 Veryl Gottron End MD Electronically signed by Sammy Crisp MD Signature Date/Time: 08/07/2023/9:51:21 AM    Final    MR BRAIN WO CONTRAST Result Date: 08/05/2023 CLINICAL DATA:  Neuro deficit, acute, stroke suspected; Carotid artery aneurysm. Speech disturbance. Weakness. EXAM: MRI HEAD WITHOUT CONTRAST MRA HEAD WITHOUT CONTRAST MRA NECK WITHOUT AND WITH CONTRAST TECHNIQUE: Multiplanar, multi-echo pulse sequences of the brain  and surrounding structures were acquired without intravenous contrast. Angiographic images of the Circle of Willis were acquired using MRA technique without intravenous contrast. Angiographic images of the neck were acquired using MRA technique without and with intravenous contrast. Carotid stenosis measurements (when applicable) are obtained utilizing NASCET criteria, using the distal internal carotid diameter as the denominator. CONTRAST:  7mL GADAVIST GADOBUTROL 1 MMOL/ML IV SOLN COMPARISON:  Head CT 08/05/2023. Head MRI 03/17/2023. Head MRA 03/18/2023. FINDINGS: MRI HEAD FINDINGS Brain: There are small acute white matter infarcts in the posterior left frontal lobe and right parietal lobe, and there  is also a punctate acute infarct in the left cerebellum at the posterior aspect of the middle cerebellar peduncle. Numerous chronic microhemorrhages are again seen in both cerebral hemispheres (including prominent deep gray nuclei involvement), brainstem, and cerebellum suggestive of chronic hypertension. Patchy to confluent T2 hyperintensities in the cerebral white matter bilaterally are similar to the prior MRI and are nonspecific but compatible with extensive chronic small vessel ischemic disease. Chronic lacunar infarcts are again noted in the bilateral cerebral white matter, deep gray nuclei, and pons. No mass, midline shift, or extra-axial fluid collection is identified. There is mild cerebral atrophy. Vascular: Major intracranial arterial flow voids are preserved. Skull and upper cervical spine: No suspicious marrow lesion. Advanced cervical facet arthrosis. Sinuses/Orbits: Bilateral cataract extraction. Mild mucosal thickening and fluid/secretions in the left maxillary sinus. Clear mastoid air cells. Other: None. MRA HEAD FINDINGS Anterior circulation: The internal carotid arteries are widely patent from skull base to carotid termini. There is improved signal in the proximal left MCA compared to the prior MRA  without evidence of a significant M1 stenosis currently. There are likely mild to moderate proximal left M2 stenoses. There is also occlusion of a mid left M2 branch vessel (series 15, image 105) which is unchanged from the 03/18/2023 MRA but is new from a 2018 study. The right MCA is patent with mild branch vessel irregularity but no evidence of a proximal branch occlusion or significant M1 stenosis. Both ACAs are patent proximally without evidence of a significant A1 stenosis. Severe stenosis or segmental occlusion involving the right A3 segment is unchanged from the most recent prior MRA. No aneurysm is identified. Posterior circulation: The included portions of the intracranial vertebral arteries are widely patent to the basilar. Patent PICA and SCA origins are visualized bilaterally. The basilar artery is widely patent. Posterior communicating arteries are diminutive or absent. Both PCAs are patent without evidence of a significant proximal stenosis on the right. There is a moderate proximal left P2 stenosis which appears progressed. No aneurysm is identified. Anatomic variants: None. MRA NECK FINDINGS Aortic arch: Standard branching. Widely patent brachiocephalic and subclavian arteries. Right carotid system: Patent without evidence of a significant stenosis or dissection. Left carotid system: Patent without evidence of a significant stenosis or dissection. Tortuous proximal ICA. Vertebral arteries: Patent with antegrade flow bilaterally and with the right being slightly dominant. Poor assessment of the left vertebral artery origin as this was at the very posterior aspect of the imaging volume. No evidence of a significant stenosis or dissection elsewhere on either side. IMPRESSION: 1. Small acute infarcts in the left frontal lobe, right parietal lobe, and left cerebellum. 2. Extensive chronic small vessel ischemic disease. 3. 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 4. Unchanged severe right A3  stenosis or segmental occlusion. 5. Moderate left P2 stenosis. 6. Limited assessment of the left vertebral artery origin, otherwise negative neck MRA. Electronically Signed   By: Aundra Lee M.D.   On: 08/05/2023 17:57   MR ANGIO HEAD WO CONTRAST Result Date: 08/05/2023 CLINICAL DATA:  Neuro deficit, acute, stroke suspected; Carotid artery aneurysm. Speech disturbance. Weakness. EXAM: MRI HEAD WITHOUT CONTRAST MRA HEAD WITHOUT CONTRAST MRA NECK WITHOUT AND WITH CONTRAST TECHNIQUE: Multiplanar, multi-echo pulse sequences of the brain and surrounding structures were acquired without intravenous contrast. Angiographic images of the Circle of Willis were acquired using MRA technique without intravenous contrast. Angiographic images of the neck were acquired using MRA technique without and with intravenous contrast. Carotid stenosis measurements (when applicable)  are obtained utilizing NASCET criteria, using the distal internal carotid diameter as the denominator. CONTRAST:  7mL GADAVIST GADOBUTROL 1 MMOL/ML IV SOLN COMPARISON:  Head CT 08/05/2023. Head MRI 03/17/2023. Head MRA 03/18/2023. FINDINGS: MRI HEAD FINDINGS Brain: There are small acute white matter infarcts in the posterior left frontal lobe and right parietal lobe, and there is also a punctate acute infarct in the left cerebellum at the posterior aspect of the middle cerebellar peduncle. Numerous chronic microhemorrhages are again seen in both cerebral hemispheres (including prominent deep gray nuclei involvement), brainstem, and cerebellum suggestive of chronic hypertension. Patchy to confluent T2 hyperintensities in the cerebral white matter bilaterally are similar to the prior MRI and are nonspecific but compatible with extensive chronic small vessel ischemic disease. Chronic lacunar infarcts are again noted in the bilateral cerebral white matter, deep gray nuclei, and pons. No mass, midline shift, or extra-axial fluid collection is identified. There is  mild cerebral atrophy. Vascular: Major intracranial arterial flow voids are preserved. Skull and upper cervical spine: No suspicious marrow lesion. Advanced cervical facet arthrosis. Sinuses/Orbits: Bilateral cataract extraction. Mild mucosal thickening and fluid/secretions in the left maxillary sinus. Clear mastoid air cells. Other: None. MRA HEAD FINDINGS Anterior circulation: The internal carotid arteries are widely patent from skull base to carotid termini. There is improved signal in the proximal left MCA compared to the prior MRA without evidence of a significant M1 stenosis currently. There are likely mild to moderate proximal left M2 stenoses. There is also occlusion of a mid left M2 branch vessel (series 15, image 105) which is unchanged from the 03/18/2023 MRA but is new from a 2018 study. The right MCA is patent with mild branch vessel irregularity but no evidence of a proximal branch occlusion or significant M1 stenosis. Both ACAs are patent proximally without evidence of a significant A1 stenosis. Severe stenosis or segmental occlusion involving the right A3 segment is unchanged from the most recent prior MRA. No aneurysm is identified. Posterior circulation: The included portions of the intracranial vertebral arteries are widely patent to the basilar. Patent PICA and SCA origins are visualized bilaterally. The basilar artery is widely patent. Posterior communicating arteries are diminutive or absent. Both PCAs are patent without evidence of a significant proximal stenosis on the right. There is a moderate proximal left P2 stenosis which appears progressed. No aneurysm is identified. Anatomic variants: None. MRA NECK FINDINGS Aortic arch: Standard branching. Widely patent brachiocephalic and subclavian arteries. Right carotid system: Patent without evidence of a significant stenosis or dissection. Left carotid system: Patent without evidence of a significant stenosis or dissection. Tortuous proximal  ICA. Vertebral arteries: Patent with antegrade flow bilaterally and with the right being slightly dominant. Poor assessment of the left vertebral artery origin as this was at the very posterior aspect of the imaging volume. No evidence of a significant stenosis or dissection elsewhere on either side. IMPRESSION: 1. Small acute infarcts in the left frontal lobe, right parietal lobe, and left cerebellum. 2. Extensive chronic small vessel ischemic disease. 3. 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 4. Unchanged severe right A3 stenosis or segmental occlusion. 5. Moderate left P2 stenosis. 6. Limited assessment of the left vertebral artery origin, otherwise negative neck MRA. Electronically Signed   By: Aundra Lee M.D.   On: 08/05/2023 17:57   MR Angiogram Neck W or Wo Contrast Result Date: 08/05/2023 CLINICAL DATA:  Neuro deficit, acute, stroke suspected; Carotid artery aneurysm. Speech disturbance. Weakness. EXAM: MRI HEAD WITHOUT CONTRAST MRA  HEAD WITHOUT CONTRAST MRA NECK WITHOUT AND WITH CONTRAST TECHNIQUE: Multiplanar, multi-echo pulse sequences of the brain and surrounding structures were acquired without intravenous contrast. Angiographic images of the Circle of Willis were acquired using MRA technique without intravenous contrast. Angiographic images of the neck were acquired using MRA technique without and with intravenous contrast. Carotid stenosis measurements (when applicable) are obtained utilizing NASCET criteria, using the distal internal carotid diameter as the denominator. CONTRAST:  7mL GADAVIST GADOBUTROL 1 MMOL/ML IV SOLN COMPARISON:  Head CT 08/05/2023. Head MRI 03/17/2023. Head MRA 03/18/2023. FINDINGS: MRI HEAD FINDINGS Brain: There are small acute white matter infarcts in the posterior left frontal lobe and right parietal lobe, and there is also a punctate acute infarct in the left cerebellum at the posterior aspect of the middle cerebellar peduncle. Numerous chronic  microhemorrhages are again seen in both cerebral hemispheres (including prominent deep gray nuclei involvement), brainstem, and cerebellum suggestive of chronic hypertension. Patchy to confluent T2 hyperintensities in the cerebral white matter bilaterally are similar to the prior MRI and are nonspecific but compatible with extensive chronic small vessel ischemic disease. Chronic lacunar infarcts are again noted in the bilateral cerebral white matter, deep gray nuclei, and pons. No mass, midline shift, or extra-axial fluid collection is identified. There is mild cerebral atrophy. Vascular: Major intracranial arterial flow voids are preserved. Skull and upper cervical spine: No suspicious marrow lesion. Advanced cervical facet arthrosis. Sinuses/Orbits: Bilateral cataract extraction. Mild mucosal thickening and fluid/secretions in the left maxillary sinus. Clear mastoid air cells. Other: None. MRA HEAD FINDINGS Anterior circulation: The internal carotid arteries are widely patent from skull base to carotid termini. There is improved signal in the proximal left MCA compared to the prior MRA without evidence of a significant M1 stenosis currently. There are likely mild to moderate proximal left M2 stenoses. There is also occlusion of a mid left M2 branch vessel (series 15, image 105) which is unchanged from the 03/18/2023 MRA but is new from a 2018 study. The right MCA is patent with mild branch vessel irregularity but no evidence of a proximal branch occlusion or significant M1 stenosis. Both ACAs are patent proximally without evidence of a significant A1 stenosis. Severe stenosis or segmental occlusion involving the right A3 segment is unchanged from the most recent prior MRA. No aneurysm is identified. Posterior circulation: The included portions of the intracranial vertebral arteries are widely patent to the basilar. Patent PICA and SCA origins are visualized bilaterally. The basilar artery is widely patent.  Posterior communicating arteries are diminutive or absent. Both PCAs are patent without evidence of a significant proximal stenosis on the right. There is a moderate proximal left P2 stenosis which appears progressed. No aneurysm is identified. Anatomic variants: None. MRA NECK FINDINGS Aortic arch: Standard branching. Widely patent brachiocephalic and subclavian arteries. Right carotid system: Patent without evidence of a significant stenosis or dissection. Left carotid system: Patent without evidence of a significant stenosis or dissection. Tortuous proximal ICA. Vertebral arteries: Patent with antegrade flow bilaterally and with the right being slightly dominant. Poor assessment of the left vertebral artery origin as this was at the very posterior aspect of the imaging volume. No evidence of a significant stenosis or dissection elsewhere on either side. IMPRESSION: 1. Small acute infarcts in the left frontal lobe, right parietal lobe, and left cerebellum. 2. Extensive chronic small vessel ischemic disease. 3. 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 4. Unchanged severe right A3 stenosis or segmental occlusion. 5. Moderate left P2  stenosis. 6. Limited assessment of the left vertebral artery origin, otherwise negative neck MRA. Electronically Signed   By: Aundra Lee M.D.   On: 08/05/2023 17:57   DG Chest 2 View Result Date: 08/05/2023 CLINICAL DATA:  Increasing weakness and shortness of breath. EXAM: CHEST - 2 VIEW COMPARISON:  06/25/2023. FINDINGS: The heart is enlarged the mediastinal contour is within normal limits. There is atherosclerotic calcification aorta. Emphysematous changes are present in the lungs. Minimal subsegmental are atelectasis or scarring is noted in the mid left lung. No consolidation, effusion, or pneumothorax is seen. No acute osseous abnormality. IMPRESSION: No active cardiopulmonary disease. Electronically Signed   By: Wyvonnia Heimlich M.D.   On: 08/05/2023 13:28   CT HEAD WO  CONTRAST Result Date: 08/05/2023 EXAM: CT HEAD WITHOUT CONTRAST 08/05/2023 12:22:00 PM TECHNIQUE: CT of the head was performed without the administration of intravenous contrast. Automated exposure control, iterative reconstruction, and/or weight based adjustment of the mA/kV was utilized to reduce the radiation dose to as low as reasonably achievable. COMPARISON: MR head 03/17/2023 CLINICAL HISTORY: Stroke, follow up. Table formatting from the original note was not included. Images from the original note were not included. Patient via POV from home. States that since Tuesday she has been having increased weakness and shortness of breath. Patient had a stroke in January. Daughter reports around 5pm yesterday she noticed she had increased generalized weakness, loss of balance. Patient stated also having a harder time finding her words but unable to establish last known well, patient poor historian. Patient has a history of COPD, patient is supposed to use 2L nasal cannula chronically but patient non-compliant. Patient is alert and oriented x4 and no acute distress. FINDINGS: BRAIN AND VENTRICLES: Moderate atrophy and white matter disease is again seen. Remote white matter infarcts are present in the corona radiata bilaterally. Remote lacunar infarcts are present in the right basal ganglia and thalami bilaterally. No acute intracranial hemorrhage, mass effect or midline shift. No abnormal extra-axial fluid collection. The gray-white differentiation is maintained without an acute infarct. There is no hydrocephalus. ORBITS: Bilateral lens replacements are noted. The globes and orbits are otherwise within normal limits. SINUSES: The visualized paranasal sinuses and mastoid air cells demonstrate no acute abnormality. SOFT TISSUES AND SKULL: No acute abnormality of the visualized skull or soft tissues. VASCULATURE: Atherosclerotic calcifications are present in the cavernous carotid arteries bilaterally and at the dural  margin of both vertebral arteries. No hyperdense vessel is present. IMPRESSION: 1. No acute intracranial abnormality. 2. Moderate atrophy and white matter disease. 3. Remote white matter infarcts in the corona radiata bilaterally and remote lacunar infarcts in the right basal ganglia and thalami bilaterally. 4. Atherosclerotic calcifications in the cavernous carotid arteries bilaterally and at the dural margin of both vertebral arteries. No hyperdense vessel is present. Electronically signed by: Audree Leas MD 08/05/2023 12:42 PM EDT RP Workstation: [id number]    Microbiology: Results for orders placed or performed during the hospital encounter of 08/05/23  Resp panel by RT-PCR (RSV, Flu A&B, Covid) Anterior Nasal Swab     Status: None   Collection Time: 08/05/23  1:50 PM   Specimen: Anterior Nasal Swab  Result Value Ref Range Status   SARS Coronavirus 2 by RT PCR NEGATIVE NEGATIVE Final    Comment: (NOTE) SARS-CoV-2 target nucleic acids are NOT DETECTED.  The SARS-CoV-2 RNA is generally detectable in upper respiratory specimens during the acute phase of infection. The lowest concentration of SARS-CoV-2 viral copies this assay can detect is  138 copies/mL. A negative result does not preclude SARS-Cov-2 infection and should not be used as the sole basis for treatment or other patient management decisions. A negative result may occur with  improper specimen collection/handling, submission of specimen other than nasopharyngeal swab, presence of viral mutation(s) within the areas targeted by this assay, and inadequate number of viral copies(<138 copies/mL). A negative result must be combined with clinical observations, patient history, and epidemiological information. The expected result is Negative.  Fact Sheet for Patients:  BloggerCourse.com  Fact Sheet for Healthcare Providers:  SeriousBroker.it  This test is no t yet approved or  cleared by the [place]  FDA and  has been authorized for detection and/or diagnosis of SARS-CoV-2 by FDA under an Emergency Use Authorization (EUA). This EUA will remain  in effect (meaning this test can be used) for the duration of the COVID-19 declaration under Section 564(b)(1) of the Act, 21 U.S.C.section 360bbb-3(b)(1), unless the authorization is terminated  or revoked sooner.       Influenza A by PCR NEGATIVE NEGATIVE Final   Influenza B by PCR NEGATIVE NEGATIVE Final    Comment: (NOTE) The Xpert Xpress SARS-CoV-2/FLU/RSV plus assay is intended as an aid in the diagnosis of influenza from Nasopharyngeal swab specimens and should not be used as a sole basis for treatment. Nasal washings and aspirates are unacceptable for Xpert Xpress SARS-CoV-2/FLU/RSV testing.  Fact Sheet for Patients: BloggerCourse.com  Fact Sheet for Healthcare Providers: SeriousBroker.it  This test is not yet approved or cleared by the [place]  FDA and has been authorized for detection and/or diagnosis of SARS-CoV-2 by FDA under an Emergency Use Authorization (EUA). This EUA will remain in effect (meaning this test can be used) for the duration of the COVID-19 declaration under Section 564(b)(1) of the Act, 21 U.S.C. section 360bbb-3(b)(1), unless the authorization is terminated or revoked.     Resp Syncytial Virus by PCR NEGATIVE NEGATIVE Final    Comment: (NOTE) Fact Sheet for Patients: BloggerCourse.com  Fact Sheet for Healthcare Providers: SeriousBroker.it  This test is not yet approved or cleared by the [place]  FDA and has been authorized for detection and/or diagnosis of SARS-CoV-2 by FDA under an Emergency Use Authorization (EUA). This EUA will remain in effect (meaning this test can be used) for the duration of the COVID-19 declaration under Section 564(b)(1) of the Act, 21  U.S.C. section 360bbb-3(b)(1), unless the authorization is terminated or revoked.  Performed at [facility], [address] [place], [place] [address]     Labs: CBC: Recent Labs  Lab 08/05/23 1154  WBC 7.2  NEUTROABS 3.7  HGB 16.2*  HCT 50.9*  MCV 97.1  PLT 213   Basic Metabolic Panel: Recent Labs  Lab 08/05/23 1154 08/06/23 0452  NA 141 139  K 3.8 3.5  CL 104 105  CO2 26 25  GLUCOSE 192* 160*  BUN 19 21  CREATININE 1.35* 1.24*  CALCIUM  10.0 9.6   Liver Function Tests: Recent Labs  Lab 08/05/23 1154  AST 24  ALT 18  ALKPHOS 87  BILITOT 0.7  PROT 7.0  ALBUMIN 3.4*   CBG: Recent Labs  Lab 08/06/23 1724 08/06/23 2133 08/07/23 0814 08/07/23 1159 08/07/23 1608  GLUCAP 137* 129* 148* 118* 125*    Discharge time spent: 32 minutes.  Signed: Alease Fait, DO [facility] 08/07/2023

## 2023-08-07 NOTE — Progress Notes (Signed)
 Brief EP note -   Patient presents to [facility] with recurrent CVA, highly suggestive of embolic source.   I reviewed her telemetry from this hospitalization and all EKGs in chart, no evidence of AF identified.   No EP MD available for ILR implant.  Will order live zio to be worn until outpatient ILR can be implanted. Appt scheduled on 6/24 with Dr. Daneil Dunker  Patient, daughter, and spouse all agreeable with this plan.     Lira Stephen, NP Electrophysiology 08/07/23 5:52 PM

## 2023-08-07 NOTE — Progress Notes (Signed)
 Physical Therapy Treatment Patient Details Name: Hannah Kirby MRN: [id number] DOB: 24-Jun-1945 Today's Date: 08/07/2023   History of Present Illness 78 y.o. female who presents with weakness, poor balance, difficulty speaking. MRI findings:  Small acute infarcts in the left frontal lobe, right parietal lobe, and left cerebellum. PMHx significant of  stroke with dysarthria,  HTN, CKD stage IIIB, DM2 on insulin , CAD, chronic smoking, chronic respiratory failure with COPD and on home oxygen , anxiety, HLD    PT Comments  Pt was received in bed lethargic upon PT arriving. Pt agreed to attempting PT after words of encouragement from PT. Pt was able to come to sitting EOB from supine independently without physical assistance or use of bedrails with increased time. Pt completed STS and ambulated 25 feet with min assist and RW before reporting fatigue. Pt remained lethargic throughout today's session, so stair training was not attempted. Pt reported no adverse symptoms during the session other than fatigue with SpO2 and HR WNL throughout on 2L. Pt will benefit from continued PT services upon discharge to safely address deficits listed in patient problem list for decreased caregiver assistance and eventual return to PLOF.    If plan is discharge home, recommend the following: A little help with walking and/or transfers;A little help with bathing/dressing/bathroom   Can travel by private vehicle        Equipment Recommendations  Rolling walker (2 wheels)    Recommendations for Other Services       Precautions / Restrictions Precautions Precautions: Fall Recall of Precautions/Restrictions: Intact Restrictions Weight Bearing Restrictions Per Provider Order: No     Mobility  Bed Mobility Overal bed mobility: Independent             General bed mobility comments: Pt was able to come to sitting EOB from supine independently with no physical assistance or use of bedrails, required increased  time    Transfers Overall transfer level: Needs assistance Equipment used: Rolling walker (2 wheels) Transfers: Sit to/from Stand Sit to Stand: Min assist           General transfer comment: VC's required for proper hand placement to push off from bed to complete STS    Ambulation/Gait Ambulation/Gait assistance: Min assist Gait Distance (Feet): 25 Feet x 1, 5 feet x 1 Assistive device: Rolling walker (2 wheels) Gait Pattern/deviations: Step-to pattern, Decreased stride length, Decreased step length - right, Decreased step length - left, Trunk flexed, Narrow base of support Gait velocity: decreased     General Gait Details: Pt demonstrated decreased step length and stride length bilaterally and flexed trunk. Pt reliant on RW with BUEs during ambulation, but remained steady and no LOBs occured. Min assist required to prevent LOB.    Stairs             Wheelchair Mobility     Tilt Bed    Modified Rankin (Stroke Patients Only)       Balance Overall balance assessment: Needs assistance Sitting-balance support: No upper extremity supported, Feet supported Sitting balance-Leahy Scale: Good Sitting balance - Comments: Pt able to remain steady on EOB without UE support   Standing balance support: Bilateral upper extremity supported, During functional activity, Reliant on assistive device for balance Standing balance-Leahy Scale: Poor Standing balance comment: Pt required RW to maintain balance during standing                            Communication Communication Communication: Impaired  Factors Affecting Communication: Reduced clarity of speech  Cognition Arousal: Lethargic Behavior During Therapy: Restless, Flat affect   PT - Cognitive impairments: No apparent impairments                         Following commands: Intact      Cueing Cueing Techniques: Verbal cues  Exercises      General Comments        Pertinent Vitals/Pain  Pain Assessment Pain Assessment: No/denies pain    Home Living                          Prior Function            PT Goals (current goals can now be found in the care plan section) Progress towards PT goals: PT to reassess next treatment    Frequency    Min 3X/week      PT Plan      Co-evaluation              AM-PAC PT 6 Clicks Mobility   Outcome Measure  Help needed turning from your back to your side while in a flat bed without using bedrails?: None Help needed moving from lying on your back to sitting on the side of a flat bed without using bedrails?: None Help needed moving to and from a bed to a chair (including a wheelchair)?: A Little Help needed standing up from a chair using your arms (e.g., wheelchair or bedside chair)?: A Little Help needed to walk in hospital room?: A Little Help needed climbing 3-5 steps with a railing? : A Lot 6 Click Score: 19    End of Session Equipment Utilized During Treatment: Gait belt Activity Tolerance: Patient limited by lethargy Patient left: in chair;with call bell/phone within reach;with chair alarm set;with family/visitor present Nurse Communication: Mobility status PT Visit Diagnosis: Hemiplegia and hemiparesis Hemiplegia - Right/Left: Right Hemiplegia - dominant/non-dominant: Dominant Hemiplegia - caused by: Cerebral infarction     Time: 1120-1150 PT Time Calculation (min) (ACUTE ONLY): 30 min  Charges:                            Rhoda Centers, SPT 08/07/23, 2:13 PM

## 2023-08-08 ENCOUNTER — Ambulatory Visit: Payer: Self-pay | Admitting: Family Medicine

## 2023-08-08 ENCOUNTER — Ambulatory Visit: Admitting: Physical Therapy

## 2023-08-08 ENCOUNTER — Telehealth: Payer: Self-pay

## 2023-08-08 ENCOUNTER — Ambulatory Visit

## 2023-08-08 DIAGNOSIS — I639 Cerebral infarction, unspecified: Secondary | ICD-10-CM | POA: Diagnosis not present

## 2023-08-08 LAB — URINE CULTURE: Culture: <10000 — AB

## 2023-08-08 NOTE — Transitions of Care (Post Inpatient/ED Visit) (Signed)
   08/08/2023  Name: Hannah Kirby MRN: [id number] DOB: [birth year]  Today's TOC FU Call Status: Today's TOC FU Call Status:: Unsuccessful Call (1st Attempt) Unsuccessful Call (1st Attempt) Date: 08/08/23  Attempted to reach the patient regarding the most recent Inpatient/ED visit.  Follow Up Plan: Additional outreach attempts will be made to reach the patient to complete the Transitions of Care (Post Inpatient/ED visit) call.   Signature Darrall Ellison, LPN [facility] Nurse Health Advisor Direct Dial [phone]

## 2023-08-09 NOTE — Transitions of Care (Post Inpatient/ED Visit) (Signed)
 08/09/2023  Name: Hannah Kirby MRN: [id number] DOB: 1946-01-07  Today's TOC FU Call Status: Today's TOC FU Call Status:: Successful TOC FU Call Completed Unsuccessful Call (1st Attempt) Date: 08/08/23 [facility] FU Call Complete Date: 08/09/23 Patient's Name and Date of Birth confirmed.  Transition Care Management Follow-up Telephone Call Date of Discharge: 08/07/23 Discharge Facility: [facility]) Type of Discharge: Inpatient Admission Primary Inpatient Discharge Diagnosis:: cerebral infarction How have you been since you were released from the hospital?: Better Any questions or concerns?: No  Items Reviewed: Did you receive and understand the discharge instructions provided?: Yes Medications obtained,verified, and reconciled?: Yes (Medications Reviewed) Any new allergies since your discharge?: No Dietary orders reviewed?: Yes Do you have support at home?: Yes People in Home [RPT]: child(ren), adult  Medications Reviewed Today: Medications Reviewed Today     Reviewed by Darrall Ellison, LPN (Licensed Practical Nurse) on 08/09/23 at 1504  Med List Status: <None>   Medication Order Taking? Sig Documenting Provider Last Dose Status Informant  acetaminophen  (TYLENOL ) 500 MG tablet [id number] Yes Take 500 mg by mouth every 6 (six) hours as needed. [provider]  Active Self, Child           Med Note Marvene Slipper Aug 06, 2023  8:47 AM) prn  albuterol  (VENTOLIN  HFA) 108 (90 Base) MCG/ACT inhaler [id number] Yes Inhale 2 puffs into the lungs every 4 (four) hours as needed for wheezing or shortness of breath. Lamon Pillow, MD  Active Self, Child           Med Note Marvene Slipper Aug 06, 2023  8:36 AM) prn  aspirin  (ASPIR-81) 81 MG EC tablet [id number] Yes Take 81 mg by mouth daily. [provider]  Active Self, Child  benzonatate  (TESSALON ) 100 MG capsule [id number] Yes Take 1 capsule (100 mg total) by mouth every 8  (eight) hours. Lamon Pillow, MD  Active Child, Self           Med Note Marvene Slipper Aug 06, 2023  8:52 AM) prn  calcium  carbonate (TUMS - DOSED IN MG ELEMENTAL CALCIUM ) 500 MG chewable tablet [id number] Yes Chew 1 tablet by mouth daily as needed. [provider]  Active Self, Child           Med Note Marvene Slipper Aug 06, 2023  8:47 AM) prn  clopidogrel  (PLAVIX ) 75 MG tablet [id number] Yes Take 1 tablet (75 mg total) by mouth daily. Gollan, Timothy J, MD  Active Self, Child  cyanocobalamin  1000 MCG tablet [id number] Yes Take 1,000 mcg by mouth daily. [provider]  Active Self, Child  dapagliflozin  propanediol (FARXIGA ) 5 MG TABS tablet [id number] Yes Take 1 tablet (5 mg total) by mouth daily before breakfast.  Patient taking differently: Take 2.5 mg by mouth every morning.   Carlean Charter, DO  Active Self, Child  Docusate Sodium  (DSS) 100 MG CAPS [id number] Yes Take 1 capsule by mouth daily. [provider]  Active Self, Child           Med Note Marvene Slipper Aug 06, 2023  8:48 AM) prn  ezetimibe  (ZETIA ) 10 MG tablet [id number] Yes TAKE ONE TABLET BY MOUTH EVERY DAY Gollan, Timothy J, MD  Active Child, Self  felodipine  (PLENDIL ) 5 MG 24 hr tablet [id number] Yes TAKE 1 TABLET BY MOUTH DAILY Shann Darnel Erlinda Haws, MD  Active  Child, Self  Finerenone (KERENDIA) 10 MG TABS [id number] Yes Take 10 mg by mouth. [provider]  Active Child, Self  Fluticasone -Umeclidin-Vilant (TRELEGY ELLIPTA ) 100-62.5-25 MCG/ACT AEPB [id number] Yes INHALE 1 PUFF BY MOUTH EVERY DAY - DISCARD DEVICE 6 WEEKS AFTER IT IS REMOVED FROM THE FOIL TRAY OR WHEN THE DOSE COUNTER READS &quot;0&quot; (WHICHEVER COMES FIRST) Lamon Pillow, MD  Active Child, Self  HYDROcodone -acetaminophen  (NORCO) 7.5-325 MG tablet [id number] Yes Take 1 tablet by mouth every 6 (six) hours as needed for moderate pain (pain score 4-6). Lamon Pillow, MD  Active Self, Child            Med Note Marvene Slipper Aug 06, 2023  8:44 AM) prn  insulin  glargine (LANTUS ) 100 UNIT/ML injection [id number] Yes Inject 22 Units into the skin daily. [provider]  Active Self, Child           Med Note (LAMPKIN, MELISSA A   Sat Mar 18, 2023 10:52 AM)    lisinopril  (ZESTRIL ) 20 MG tablet [id number] Yes Take 1 tablet (20 mg total) by mouth daily. Gollan, Timothy J, MD  Active Self, Child  meclizine  (ANTIVERT ) 25 MG tablet [id number] Yes Take 25 mg by mouth 3 (three) times daily as needed. [provider]  Active Self, Child           Med Note Marvene Slipper Aug 06, 2023  8:48 AM) prn  metoprolol  tartrate (LOPRESSOR ) 100 MG tablet [id number] Yes Take 1 tablet (100 mg total) by mouth 2 (two) times daily. Gollan, Timothy J, MD  Active Self, Child  Multiple Vitamins-Minerals (DAILY MULTI) TABS [id number] Yes Take 1 tablet by mouth daily. [provider]  Active Self, Child  nitroGLYCERIN  (NITROSTAT ) 0.4 MG SL tablet [id number] Yes Place 1 tablet (0.4 mg total) under the tongue every 5 (five) minutes as needed. Gollan, Timothy J, MD  Active Self, Child           Med Note Marvene Slipper Aug 06, 2023  8:45 AM) prn  Omega-3 Fatty Acids (FISH OIL ) 1000 MG CAPS [id number] Yes Take 2 capsules by mouth in the morning and at bedtime.  [provider]  Active Self, Child  ondansetron  (ZOFRAN  ODT) 4 MG disintegrating tablet [id number] Yes Take 1 tablet (4 mg total) by mouth every 8 (eight) hours as needed for nausea or vomiting. Isa Manuel, MD  Active Self, Child           Med Note Marvene Slipper Aug 06, 2023  8:46 AM) prn  RABEprazole  (ACIPHEX ) 20 MG tablet [id number] Yes Take 1 tablet (20 mg total) by mouth daily. 15 Mins. before evening meal Marnee Sink, MD  Active Self, Child  rosuvastatin  (CRESTOR ) 40 MG tablet [id number] Yes Take 1 tablet (40 mg total) by mouth daily. Lamon Pillow, MD  Active Child, Self   Semaglutide, 1 MG/DOSE, (OZEMPIC, 1 MG/DOSE,) 4 MG/3ML SOPN [id number] Yes Inject 1 mg into the skin once a week. [provider]  Active Child, Self  sertraline (ZOLOFT) 25 MG tablet [id number] Yes Take 25 mg by mouth daily. [provider]  Active Child, Self  sucralfate  (CARAFATE ) 1 g tablet [id number] Yes Take 1 tablet (1 g total) by mouth 2 (two) times daily. 15 Minutes before evening meal and at bed time Marnee Sink, MD  Active Self, Child  Home Care and Equipment/Supplies: Were Home Health Services Ordered?: Yes Name of Home Health Agency:: [facility] Has Agency set up a time to come to your home?: Yes First Home Health Visit Date: 08/10/23 Any new equipment or medical supplies ordered?: NA  Functional Questionnaire: Do you need assistance with bathing/showering or dressing?: Yes Do you need assistance with meal preparation?: Yes Do you need assistance with eating?: No Do you have difficulty maintaining continence: No Do you need assistance with getting out of bed/getting out of a chair/moving?: Yes Do you have difficulty managing or taking your medications?: Yes  Follow up appointments reviewed: PCP Follow-up appointment confirmed?: Yes Date of PCP follow-up appointment?: 08/18/23 Follow-up Provider: [facility] Follow-up appointment confirmed?: Yes Date of Specialist follow-up appointment?: 08/15/23 Follow-Up Specialty Provider:: cardio Do you need transportation to your follow-up appointment?: No Do you understand care options if your condition(s) worsen?: Yes-patient verbalized understanding    SIGNATURE Darrall Ellison, LPN [facility] Nurse Health Advisor Direct Dial [phone]

## 2023-08-10 ENCOUNTER — Ambulatory Visit

## 2023-08-10 ENCOUNTER — Telehealth: Payer: Self-pay

## 2023-08-10 ENCOUNTER — Ambulatory Visit: Admitting: Physical Therapy

## 2023-08-10 DIAGNOSIS — Z720 Tobacco use: Secondary | ICD-10-CM | POA: Diagnosis not present

## 2023-08-10 DIAGNOSIS — Z9981 Dependence on supplemental oxygen: Secondary | ICD-10-CM | POA: Diagnosis not present

## 2023-08-10 DIAGNOSIS — F418 Other specified anxiety disorders: Secondary | ICD-10-CM | POA: Diagnosis not present

## 2023-08-10 DIAGNOSIS — M6281 Muscle weakness (generalized): Secondary | ICD-10-CM | POA: Diagnosis not present

## 2023-08-10 DIAGNOSIS — I69398 Other sequelae of cerebral infarction: Secondary | ICD-10-CM | POA: Diagnosis not present

## 2023-08-10 DIAGNOSIS — I5032 Chronic diastolic (congestive) heart failure: Secondary | ICD-10-CM | POA: Diagnosis not present

## 2023-08-10 DIAGNOSIS — I130 Hypertensive heart and chronic kidney disease with heart failure and stage 1 through stage 4 chronic kidney disease, or unspecified chronic kidney disease: Secondary | ICD-10-CM | POA: Diagnosis not present

## 2023-08-10 DIAGNOSIS — I2510 Atherosclerotic heart disease of native coronary artery without angina pectoris: Secondary | ICD-10-CM | POA: Diagnosis not present

## 2023-08-10 DIAGNOSIS — N1832 Chronic kidney disease, stage 3b: Secondary | ICD-10-CM | POA: Diagnosis not present

## 2023-08-10 DIAGNOSIS — I69318 Other symptoms and signs involving cognitive functions following cerebral infarction: Secondary | ICD-10-CM | POA: Diagnosis not present

## 2023-08-10 DIAGNOSIS — J449 Chronic obstructive pulmonary disease, unspecified: Secondary | ICD-10-CM | POA: Diagnosis not present

## 2023-08-10 DIAGNOSIS — K219 Gastro-esophageal reflux disease without esophagitis: Secondary | ICD-10-CM | POA: Diagnosis not present

## 2023-08-10 DIAGNOSIS — I69322 Dysarthria following cerebral infarction: Secondary | ICD-10-CM | POA: Diagnosis not present

## 2023-08-10 DIAGNOSIS — E785 Hyperlipidemia, unspecified: Secondary | ICD-10-CM | POA: Diagnosis not present

## 2023-08-10 DIAGNOSIS — E1122 Type 2 diabetes mellitus with diabetic chronic kidney disease: Secondary | ICD-10-CM | POA: Diagnosis not present

## 2023-08-10 NOTE — Telephone Encounter (Unsigned)
 Copied from CRM [id number]. Topic: Clinical - Home Health Verbal Orders >> Aug 10, 2023  3:43 PM DeAngela L wrote: Caller/Agency: Melissa Spring Physical Therapist [facility]  Callback Number: [phone] yes a secured line  Service Requested: Physical Therapy - Nursing  - Speech therapy  Frequency: 1w1 2w2 1w2 1every2w4 physical therapy Any new concerns about the patient? Yes, patient is non compliment not checking blood sugar and not wearing oxygen  like she should Requesting Nurse and speech therapy to come help with patient

## 2023-08-11 NOTE — Telephone Encounter (Signed)
 Verbal orders given per Dr.Fisher to Ammon Bales with Myrtie Atkinson Physical Therapist [facility]

## 2023-08-11 NOTE — Telephone Encounter (Signed)
 Ok for PT, SLT and nursing

## 2023-08-15 ENCOUNTER — Ambulatory Visit: Admitting: Cardiology

## 2023-08-15 ENCOUNTER — Ambulatory Visit

## 2023-08-15 DIAGNOSIS — I69322 Dysarthria following cerebral infarction: Secondary | ICD-10-CM | POA: Diagnosis not present

## 2023-08-15 DIAGNOSIS — J449 Chronic obstructive pulmonary disease, unspecified: Secondary | ICD-10-CM | POA: Diagnosis not present

## 2023-08-15 DIAGNOSIS — I69318 Other symptoms and signs involving cognitive functions following cerebral infarction: Secondary | ICD-10-CM | POA: Diagnosis not present

## 2023-08-15 DIAGNOSIS — I69398 Other sequelae of cerebral infarction: Secondary | ICD-10-CM | POA: Diagnosis not present

## 2023-08-15 DIAGNOSIS — M6281 Muscle weakness (generalized): Secondary | ICD-10-CM | POA: Diagnosis not present

## 2023-08-15 DIAGNOSIS — I130 Hypertensive heart and chronic kidney disease with heart failure and stage 1 through stage 4 chronic kidney disease, or unspecified chronic kidney disease: Secondary | ICD-10-CM | POA: Diagnosis not present

## 2023-08-16 DIAGNOSIS — I69398 Other sequelae of cerebral infarction: Secondary | ICD-10-CM | POA: Diagnosis not present

## 2023-08-16 DIAGNOSIS — I69322 Dysarthria following cerebral infarction: Secondary | ICD-10-CM | POA: Diagnosis not present

## 2023-08-16 DIAGNOSIS — I130 Hypertensive heart and chronic kidney disease with heart failure and stage 1 through stage 4 chronic kidney disease, or unspecified chronic kidney disease: Secondary | ICD-10-CM | POA: Diagnosis not present

## 2023-08-16 DIAGNOSIS — J449 Chronic obstructive pulmonary disease, unspecified: Secondary | ICD-10-CM | POA: Diagnosis not present

## 2023-08-16 DIAGNOSIS — M6281 Muscle weakness (generalized): Secondary | ICD-10-CM | POA: Diagnosis not present

## 2023-08-16 DIAGNOSIS — I69318 Other symptoms and signs involving cognitive functions following cerebral infarction: Secondary | ICD-10-CM | POA: Diagnosis not present

## 2023-08-16 NOTE — Progress Notes (Unsigned)
 Electrophysiology Office Note:   Date:  08/17/2023  ID:  [name], DOB February 13, 1946, MRN [id number]  Primary Cardiologist: Evalene Lunger, MD Electrophysiologist: None      History of Present Illness:   Hannah Kirby is a 78 y.o. female with h/o CAD, hypertension, hyperlipidemia, diabetes, stroke who is being seen today for evaluation for loop recorder implant.  Discussed the use of AI scribe software for clinical note transcription with the patient, who gave verbal consent to proceed.  History of Present Illness Patient presented to the ED on 08/05/2023 with weakness, imbalance and dysarthria.  CT head was concerning for new stroke CVA.  Stroke was confirmed on MRI which revealed small acute infarcts in the left frontal lobe, right parietal lobe, left cerebellum. Multiple territory infarcts raise concerns about atrial fibrillation (AFib) as a contributing factor. Despite undergoing multiple short-term cardiac monitoring sessions, including wearing Zio patches, AFib has not been detected. She is currently wearing another monitor, which has not shown AFib so far.  She has previously worn Zio monitors for a total of 28 days without any episodes of AFib being recorded. She is otherwise doing relatively well. No new or acute complaints today.  Review of systems complete and found to be negative unless listed in HPI.   EP Information / Studies Reviewed:    EKG is ordered today. Personal review as below.  EKG Interpretation Date/Time:  Thursday August 17 2023 08:16:10 EDT Ventricular Rate:  67 PR Interval:  180 QRS Duration:  100 QT Interval:  428 QTC Calculation: 452 R Axis:   69  Text Interpretation: Normal sinus rhythm Normal ECG When compared with ECG of 05-Aug-2023 11:55, Criteria for Anterior infarct are no longer Present Criteria for Inferior infarct are no longer Present Confirmed by Kennyth Chew [phone]) on 08/17/2023 8:26:59 AM   Echo 08/06/2023: 1. Left ventricular ejection  fraction, by estimation, is 65 to 70%. The  left ventricle has normal function. Left ventricular endocardial border  not optimally defined to evaluate regional wall motion. There is mild left  ventricular hypertrophy. Left  ventricular diastolic parameters are consistent with Grade I diastolic  dysfunction (impaired relaxation).   2. Right ventricular systolic function is normal. The right ventricular  size is normal. Tricuspid regurgitation signal is inadequate for assessing  PA pressure.   3. The mitral valve is grossly normal. No evidence of mitral valve  regurgitation. No evidence of mitral stenosis.   4. The aortic valve is tricuspid. There is mild calcification of the  aortic valve. There is moderate thickening of the aortic valve. Aortic  valve regurgitation is not visualized. Aortic valve  sclerosis/calcification is present, without any evidence of  aortic stenosis.   5. The inferior vena cava is normal in size with <50% respiratory  variability, suggesting right atrial pressure of 8 mmHg.    Event monitor Patch Wear Time:  13 days and 23 hours (2025-02-23T19:28:22-0500 to 2025-03-09T20:28:14-0400)   Normal sinus rhythm Patient had a min HR of 59 bpm, max HR of 156 bpm, and avg HR of 75 bpm.    1 run of Ventricular Tachycardia occurred lasting 5 beats with a max rate of 128 bpm (avg 120 bpm).  1 run of Supraventricular Tachycardia occurred lasting 5 beats with a max rate of 156 bpm (avg 153 bpm).    Isolated SVEs were rare (<1.0%), SVE Couplets were rare (<1.0%), and no SVE Triplets were present. Isolated VEs were rare (<1.0%, 6489), VE Couplets were rare (<1.0%, 29), and  VE Triplets were rare (<1.0%, 5). Ventricular Trigeminy was present.    Event monitor Patch Wear Time:  13 days and 23 hours (2025-02-09T18:25:16-0500 to 2025-02-23T18:25:09-498)   Normal sinus rhythm Patient had a min HR of 56 bpm, max HR of 169 bpm, and avg HR of 70 bpm.   1 run of Supraventricular  Tachycardia occurred lasting 5 beats with a max rate of 169 bpm (avg 147 bpm).    Isolated SVEs were rare (<1.0%), SVE Triplets were rare (<1.0%), and no SVE Couplets were present.  Isolated VEs were rare (<1.0%), VE Couplets were rare (<1.0%), and no VE Triplets were present.       Physical Exam:   VS:  BP (!) 151/88   Pulse 67   Ht 5' 6 (1.676 m)   Wt 160 lb (72.6 kg)   SpO2 90%   BMI 25.82 kg/m    Wt Readings from Last 3 Encounters:  08/17/23 160 lb (72.6 kg)  08/05/23 160 lb (72.6 kg)  07/24/23 159 lb (72.1 kg)     GEN: Well nourished, well developed in no acute distress NECK: No JVD CARDIAC: Normal rate, regular rhythm RESPIRATORY:  Clear to auscultation without rales, wheezing or rhonchi  ABDOMEN: Soft, non-distended EXTREMITIES:  No edema; No deformity   ASSESSMENT AND PLAN:    #. Recurrent CVAs: Patient has had multiple strokes.  Discussed role of loop recorder for monitoring for atrial fibrillation as etiology of her strokes. Patient has worn multiple Zio monitors without AF.  - Risk and benefits of loop recorder implant were discussed with the patient, including but not limited to bleeding, infection, damage nearby structures.  Patient voiced understanding and wants to proceed.  #Hypertension -Above goal today.  Recommend checking blood pressures 1-2 times per week at home and recording the values.  Recommend bringing these recordings to the primary care physician.  SURGEON:  Fonda Kitty, MD     PREPROCEDURE DIAGNOSIS:  Cryptogenic stroke    POSTPROCEDURE DIAGNOSIS: Cryptogenic stroke     PROCEDURES:   1. Implantable loop recorder implantation    INTRODUCTION:  Sheyanne Munley Fajardo presents with a history of cryptogenic stroke The costs of loop recorder monitoring have been discussed with the patient.    DESCRIPTION OF PROCEDURE:  Informed written consent was obtained.   Time Out Completed with RN    The patient required no sedation for the procedure today.   Mapping over the patient's chest was performed to identify the area where electrograms were most prominent for ILR recording.  This area was found to be the left parasternal region over the 4th intercostal space. The patients left chest was therefore prepped and draped in the usual sterile fashion. The skin overlying the left parasternal region was infiltrated with lidocaine  for local analgesia.  A 0.5-cm incision was made over the left parasternal region over the 3rd intercostal space.  A subcutaneous ILR pocket was fashioned using a combination of sharp and blunt dissection.  A Medtronic Reveal LINQ 2 implantable loop recorder (serial # [id number]) was then placed into the pocket  R waves were very prominent and measuring 0.18mV.  Steri- Strips and a sterile dressing were then applied.  There were no early apparent complications.     CONCLUSIONS:   1. Successful implantation of a implantable loop recorder for a history of cryptogenic stroke  2. No early apparent complications.   Follow up with EP Team as needed.   Signed, Fonda Kitty, MD

## 2023-08-17 ENCOUNTER — Encounter: Payer: Self-pay | Admitting: Cardiology

## 2023-08-17 ENCOUNTER — Telehealth: Payer: Self-pay

## 2023-08-17 ENCOUNTER — Ambulatory Visit

## 2023-08-17 ENCOUNTER — Ambulatory Visit: Admitting: Physical Therapy

## 2023-08-17 ENCOUNTER — Ambulatory Visit: Attending: Cardiology | Admitting: Cardiology

## 2023-08-17 VITALS — BP 151/88 | HR 67 | Ht 66.0 in | Wt 160.0 lb

## 2023-08-17 DIAGNOSIS — I10 Essential (primary) hypertension: Secondary | ICD-10-CM | POA: Diagnosis not present

## 2023-08-17 DIAGNOSIS — I639 Cerebral infarction, unspecified: Secondary | ICD-10-CM | POA: Insufficient documentation

## 2023-08-17 NOTE — Telephone Encounter (Signed)
 Copied from CRM [id number]. Topic: Clinical - Home Health Verbal Orders >> Aug 17, 2023 11:37 AM Silvana PARAS wrote: Caller/Agency: [facility]  Callback Number: [phone] Service Requested: Skilled Nursing  Any new concerns about the patient? Yes Caregiver declined nursing eval this week. Nursing eval order for next week needed.

## 2023-08-17 NOTE — Patient Instructions (Signed)
 Medication Instructions:  Your physician recommends that you continue on your current medications as directed. Please refer to the Current Medication list given to you today.  Labwork: None ordered.  Testing/Procedures: None ordered.  Follow-Up:  As needed with Dr. Jimmey Ralph  Implantable Loop Recorder Placement, Care After This sheet gives you information about how to care for yourself after your procedure. Your health care provider may also give you more specific instructions. If you have problems or questions, contact your health care provider. What can I expect after the procedure? After the procedure, it is common to have: Soreness or discomfort near the incision. Some swelling or bruising near the incision.  Follow these instructions at home: Incision care  Monitor your cardiac device site for redness, swelling, and drainage. Call the device clinic at [phone] if you experience these symptoms or fever/chills.  Keep the large square bandage on your site for 24 hours and then you may remove it yourself. Keep the steri-strips underneath in place.   You may shower after 72 hours / 3 days from your procedure with the steri-strips in place. They will usually fall off on their own, or may be removed after 10 days. Pat dry.   Avoid lotions, ointments, or perfumes over your incision until it is well-healed.  Please do not submerge in water until your site is completely healed.   Your device is MRI compatible.   Remote monitoring is used to monitor your cardiac device from home. This monitoring is scheduled every month by our office. It allows [place] to keep an eye on the function of your device to ensure it is working properly.  If your wound site starts to bleed apply pressure.    For help with the monitor please call Medtronic Monitor Support Specialist directly at [phone].    If you have any questions/concerns please call the device clinic at  [phone].  Activity  Return to your normal activities.  General instructions Follow instructions from your health care provider about how to manage your implantable loop recorder and transmit the information. Learn how to activate a recording if this is necessary for your type of device. You may go through a metal detection gate, and you may let someone hold a metal detector over your chest. Show your ID card if needed. Do not have an MRI unless you check with your health care provider first. Take over-the-counter and prescription medicines only as told by your health care provider. Keep all follow-up visits as told by your health care provider. This is important. Contact a health care provider if: You have redness, swelling, or pain around your incision. You have a fever. You have pain that is not relieved by your pain medicine. You have triggered your device because of fainting (syncope) or because of a heartbeat that feels like it is racing, slow, fluttering, or skipping (palpitations). Get help right away if you have: Chest pain. Difficulty breathing. Summary After the procedure, it is common to have soreness or discomfort near the incision. Change your dressing as told by your health care provider. Follow instructions from your health care provider about how to manage your implantable loop recorder and transmit the information. Keep all follow-up visits as told by your health care provider. This is important. This information is not intended to replace advice given to you by your health care provider. Make sure you discuss any questions you have with your health care provider. Document Released: 01/19/2015 Document Revised: 03/25/2017 Document Reviewed: 03/25/2017 Elsevier Patient  Education  [employer].

## 2023-08-18 ENCOUNTER — Encounter: Payer: Self-pay | Admitting: Family Medicine

## 2023-08-18 ENCOUNTER — Ambulatory Visit: Admitting: Family Medicine

## 2023-08-18 VITALS — BP 113/70 | HR 73 | Temp 97.7°F | Resp 16 | Wt 157.0 lb

## 2023-08-18 DIAGNOSIS — I5032 Chronic diastolic (congestive) heart failure: Secondary | ICD-10-CM

## 2023-08-18 DIAGNOSIS — I130 Hypertensive heart and chronic kidney disease with heart failure and stage 1 through stage 4 chronic kidney disease, or unspecified chronic kidney disease: Secondary | ICD-10-CM | POA: Diagnosis not present

## 2023-08-18 DIAGNOSIS — I69322 Dysarthria following cerebral infarction: Secondary | ICD-10-CM

## 2023-08-18 DIAGNOSIS — J449 Chronic obstructive pulmonary disease, unspecified: Secondary | ICD-10-CM

## 2023-08-18 DIAGNOSIS — Z8673 Personal history of transient ischemic attack (TIA), and cerebral infarction without residual deficits: Secondary | ICD-10-CM

## 2023-08-18 DIAGNOSIS — I69318 Other symptoms and signs involving cognitive functions following cerebral infarction: Secondary | ICD-10-CM | POA: Diagnosis not present

## 2023-08-18 DIAGNOSIS — M6281 Muscle weakness (generalized): Secondary | ICD-10-CM | POA: Diagnosis not present

## 2023-08-18 DIAGNOSIS — I69398 Other sequelae of cerebral infarction: Secondary | ICD-10-CM | POA: Diagnosis not present

## 2023-08-18 DIAGNOSIS — R270 Ataxia, unspecified: Secondary | ICD-10-CM

## 2023-08-18 NOTE — Telephone Encounter (Signed)
 Advised

## 2023-08-18 NOTE — Telephone Encounter (Signed)
 OK, for skilled nursing

## 2023-08-18 NOTE — Patient Instructions (Signed)
 Marland Kitchen  Please review the attached list of medications and notify my office if there are any errors.   . Please bring all of your medications to every appointment so we can make sure that our medication list is the same as yours.

## 2023-08-21 ENCOUNTER — Telehealth: Payer: Self-pay

## 2023-08-21 NOTE — Telephone Encounter (Signed)
 Order signed and sent to medical records to be faxed.

## 2023-08-21 NOTE — Telephone Encounter (Signed)
 Copied from CRM [id number]. Topic: Clinical - Home Health Verbal Orders >> Aug 21, 2023  8:43 AM Berwyn MATSU wrote: Caller/Agency: [facility] Callback Number: [phone] Service Requested: Speech Therapy Frequency: 1x 7 weeks Any new concerns about the patient? Yes; patient had a another stroke since they last had her in and is still having memory issues.

## 2023-08-22 ENCOUNTER — Encounter

## 2023-08-22 ENCOUNTER — Ambulatory Visit: Admitting: Physical Therapy

## 2023-08-22 DIAGNOSIS — I69322 Dysarthria following cerebral infarction: Secondary | ICD-10-CM | POA: Diagnosis not present

## 2023-08-22 DIAGNOSIS — M6281 Muscle weakness (generalized): Secondary | ICD-10-CM | POA: Diagnosis not present

## 2023-08-22 DIAGNOSIS — I69318 Other symptoms and signs involving cognitive functions following cerebral infarction: Secondary | ICD-10-CM | POA: Diagnosis not present

## 2023-08-22 DIAGNOSIS — I69398 Other sequelae of cerebral infarction: Secondary | ICD-10-CM | POA: Diagnosis not present

## 2023-08-22 DIAGNOSIS — I130 Hypertensive heart and chronic kidney disease with heart failure and stage 1 through stage 4 chronic kidney disease, or unspecified chronic kidney disease: Secondary | ICD-10-CM | POA: Diagnosis not present

## 2023-08-22 DIAGNOSIS — J449 Chronic obstructive pulmonary disease, unspecified: Secondary | ICD-10-CM | POA: Diagnosis not present

## 2023-08-24 ENCOUNTER — Encounter

## 2023-08-24 ENCOUNTER — Ambulatory Visit

## 2023-08-24 DIAGNOSIS — I130 Hypertensive heart and chronic kidney disease with heart failure and stage 1 through stage 4 chronic kidney disease, or unspecified chronic kidney disease: Secondary | ICD-10-CM | POA: Diagnosis not present

## 2023-08-24 DIAGNOSIS — J449 Chronic obstructive pulmonary disease, unspecified: Secondary | ICD-10-CM | POA: Diagnosis not present

## 2023-08-24 DIAGNOSIS — I69322 Dysarthria following cerebral infarction: Secondary | ICD-10-CM | POA: Diagnosis not present

## 2023-08-24 DIAGNOSIS — I69398 Other sequelae of cerebral infarction: Secondary | ICD-10-CM | POA: Diagnosis not present

## 2023-08-24 DIAGNOSIS — I69318 Other symptoms and signs involving cognitive functions following cerebral infarction: Secondary | ICD-10-CM | POA: Diagnosis not present

## 2023-08-24 DIAGNOSIS — M6281 Muscle weakness (generalized): Secondary | ICD-10-CM | POA: Diagnosis not present

## 2023-08-25 DIAGNOSIS — I130 Hypertensive heart and chronic kidney disease with heart failure and stage 1 through stage 4 chronic kidney disease, or unspecified chronic kidney disease: Secondary | ICD-10-CM | POA: Diagnosis not present

## 2023-08-25 DIAGNOSIS — I69398 Other sequelae of cerebral infarction: Secondary | ICD-10-CM | POA: Diagnosis not present

## 2023-08-25 DIAGNOSIS — M6281 Muscle weakness (generalized): Secondary | ICD-10-CM | POA: Diagnosis not present

## 2023-08-25 DIAGNOSIS — I69318 Other symptoms and signs involving cognitive functions following cerebral infarction: Secondary | ICD-10-CM | POA: Diagnosis not present

## 2023-08-25 DIAGNOSIS — J449 Chronic obstructive pulmonary disease, unspecified: Secondary | ICD-10-CM | POA: Diagnosis not present

## 2023-08-25 DIAGNOSIS — I69322 Dysarthria following cerebral infarction: Secondary | ICD-10-CM | POA: Diagnosis not present

## 2023-08-25 NOTE — Progress Notes (Signed)
 Established patient visit   Patient: Hannah Kirby   DOB: March 12, 1945   78 y.o. Female  MRN: [id number] Visit Date: 08/18/2023  Today's healthcare provider: [name] Perry, MD   Chief Complaint  Patient presents with   Hospitalization Follow-up    Patient still with no energy, not sleeping well and poor appetite.   Subjective    Discussed the use of AI scribe software for clinical note transcription with the patient, who gave verbal consent to proceed.  History of Present Illness   Hannah Kirby is a 78 year old female with a history of stroke who presents for follow-up after admission for another stroke.   She was first diagnosed with acute CVA by MRI in January of this year with expressive aphasia. She was subsequently put on dual antiplatelet therapy. MRA at that time revealed Strong evidence of Severe stenosis of the distal Left MCA M1 segment and Severe stenosis or occlusion of right ACA in the distal A2/A3 segment, both of white were new compared to MRA of 2018. There were HD significant carotid stenosis on carotid ultrasound at that time. .     She experienced another stroke recently, for which she was hospitalized 6/14 through 6/16, this time affecting her speech, balance, and memory. Her speech remains somewhat impaired, and she has noticed mild memory loss. Her balance and walking have been impacted, though she has not experienced any falls. MRI revealed Small acute infarcts in the left frontal lobe, right parietal lobe, and left cerebellum. Was seen by neurology and that that these were likely embolic strokes.  There were no thrombi on her echocardiogram. neurology recommended continuing aspirin  and clopidogrel  and implanted loop recorder as outpatient.  Patient states they had discussion about starting Eliquis but it was decided not to start it unless she is found have atrial fibrillation.    She has experienced some shortness of breath and is on home oxygen  therapy,  currently set at three liters, increased from two liters after her oxygen  levels were noted to be around 90-91%. This adjustment was made to maintain her oxygen  saturation around 96%. She uses the oxygen  more frequently now after the stroke but not continuously. She has a history of COPD, and there is a concern about keeping her oxygen  levels balanced to avoid carbon dioxide buildup.  Her blood sugar was noted to be high at 220 mg/dL after a meal. She is on Ozempic, prescribed by her endocrinologist, and her Lantus  insulin  dose was recently increased from 18 to 22 units due to an A1c of 7.8%.  Home health therapists, including physical, occupational, and speech therapists, are involved in her care. She has been very tired since the recent stroke and finds the numerous appointments exhausting. No falls or swelling in her feet.     Lab Results  Component Value Date   HGBA1C 7.7 (H) 08/06/2023   Lab Results  Component Value Date   CHOL 143 08/06/2023   HDL 48 08/06/2023   LDLCALC 46 08/06/2023   LDLDIRECT 70 03/27/2023   TRIG 244 (H) 08/06/2023   CHOLHDL 3.0 08/06/2023   BP Readings from Last 3 Encounters:  08/18/23 113/70  08/17/23 (!) 151/88  08/07/23 (!) 152/76     Medications: Outpatient Medications Prior to Visit  Medication Sig   acetaminophen  (TYLENOL ) 500 MG tablet Take 500 mg by mouth every 6 (six) hours as needed.   albuterol  (VENTOLIN  HFA) 108 (90 Base) MCG/ACT inhaler Inhale 2 puffs into  the lungs every 4 (four) hours as needed for wheezing or shortness of breath.   aspirin  (ASPIR-81) 81 MG EC tablet Take 81 mg by mouth daily.   benzonatate  (TESSALON ) 100 MG capsule Take 1 capsule (100 mg total) by mouth every 8 (eight) hours.   calcium  carbonate (TUMS - DOSED IN MG ELEMENTAL CALCIUM ) 500 MG chewable tablet Chew 1 tablet by mouth daily as needed.   clopidogrel  (PLAVIX ) 75 MG tablet Take 1 tablet (75 mg total) by mouth daily.   cyanocobalamin  1000 MCG tablet Take 1,000 mcg  by mouth daily.   dapagliflozin  propanediol (FARXIGA ) 5 MG TABS tablet Take 1 tablet (5 mg total) by mouth daily before breakfast. (Patient taking differently: Take 2.5 mg by mouth every morning.)   Docusate Sodium  (DSS) 100 MG CAPS Take 1 capsule by mouth daily.   ezetimibe  (ZETIA ) 10 MG tablet TAKE ONE TABLET BY MOUTH EVERY DAY   felodipine  (PLENDIL ) 5 MG 24 hr tablet TAKE 1 TABLET BY MOUTH DAILY   Finerenone  (KERENDIA ) 10 MG TABS Take 10 mg by mouth.   Fluticasone -Umeclidin-Vilant (TRELEGY ELLIPTA ) 100-62.5-25 MCG/ACT AEPB INHALE 1 PUFF BY MOUTH EVERY DAY - DISCARD DEVICE 6 WEEKS AFTER IT IS REMOVED FROM THE FOIL TRAY OR WHEN THE DOSE COUNTER READS &quot;0&quot; (WHICHEVER COMES FIRST)   HYDROcodone -acetaminophen  (NORCO) 7.5-325 MG tablet Take 1 tablet by mouth every 6 (six) hours as needed for moderate pain (pain score 4-6).   insulin  glargine (LANTUS ) 100 UNIT/ML injection Inject 22 Units into the skin daily.   lisinopril  (ZESTRIL ) 20 MG tablet Take 1 tablet (20 mg total) by mouth daily.   meclizine  (ANTIVERT ) 25 MG tablet Take 25 mg by mouth 3 (three) times daily as needed.   metoprolol  tartrate (LOPRESSOR ) 100 MG tablet Take 1 tablet (100 mg total) by mouth 2 (two) times daily.   Multiple Vitamins-Minerals (DAILY MULTI) TABS Take 1 tablet by mouth daily.   nitroGLYCERIN  (NITROSTAT ) 0.4 MG SL tablet Place 1 tablet (0.4 mg total) under the tongue every 5 (five) minutes as needed.   Omega-3 Fatty Acids (FISH OIL ) 1000 MG CAPS Take 2 capsules by mouth in the morning and at bedtime.    ondansetron  (ZOFRAN  ODT) 4 MG disintegrating tablet Take 1 tablet (4 mg total) by mouth every 8 (eight) hours as needed for nausea or vomiting.   RABEprazole  (ACIPHEX ) 20 MG tablet Take 1 tablet (20 mg total) by mouth daily. 15 Mins. before evening meal   rosuvastatin  (CRESTOR ) 40 MG tablet Take 1 tablet (40 mg total) by mouth daily.   Semaglutide, 1 MG/DOSE, (OZEMPIC, 1 MG/DOSE,) 4 MG/3ML SOPN Inject 1 mg into the  skin once a week.   sertraline  (ZOLOFT ) 25 MG tablet Take 25 mg by mouth daily.   sucralfate  (CARAFATE ) 1 g tablet Take 1 tablet (1 g total) by mouth 2 (two) times daily. 15 Minutes before evening meal and at bed time   No facility-administered medications prior to visit.        Objective    BP 113/70 (BP Location: Left Arm, Patient Position: Sitting, Cuff Size: Normal)   Pulse 73   Temp 97.7 F (36.5 C) (Oral)   Resp 16   Wt 157 lb (71.2 kg)   SpO2 96%   BMI 25.34 kg/m   Physical Exam   General: Appearance:    Well developed, well nourished female in no acute distress  Eyes:    PERRL, conjunctiva/corneas clear, EOM's intact       Lungs:  Clear to auscultation bilaterally, respirations unlabored  Heart:    Normal heart rate. Normal rhythm. No murmurs, rubs, or gallops.    MS:   All extremities are intact.    Neurologic:   Awake, alert, oriented x 3. Mild expressive aphasia. Requires assitance standing and ambulating due to ataxia.          Assessment & Plan       Recurrent CVA (CVA) with cerebrovascular disease No significant carotid artery disease.  Recent stroke with uncertain etiology, potential embolic sources from heart or carotid arteries. Persistent speech, memory, and balance issues. Loop recorder implanted for atrial fibrillation monitoring. Anticoagulation with Eliquis withheld pending further evidence. - Refer to neurologist for follow-up. - Continue monitoring with loop recorder for atrial fibrillation. - Discussed starting DOAC such as Eliquis but it was decided at her hospitalization to get Loop record done first to see if there are arrythmias.  Consider known h/o CAD would also consider low dose result Xarelto even if loop is normal.   Chronic Obstructive Pulmonary Disease (COPD) On home oxygen  therapy at 3 L/min to maintain saturation around 96%. Risk of hypercapnia noted. Improved compliance post-stroke. - Continue home oxygen  therapy at 3 L/min. -  Monitor oxygen  saturation regularly. - Adjust oxygen  flow as needed to maintain appropriate saturation levels.  Type 2 Diabetes Mellitus Elevated blood glucose with postprandial reading of 220 mg/dL. On Ozempic and increased Lantus  due to A1c of 7.8%. Under diabetes specialist care. - Continue Ozempic and Lantus  as prescribed by endocrinology.  - Monitor blood glucose levels regularly. - Follow up with diabetes specialist for ongoing management.         [name] Perry, MD  [facility] [phone] (phone) [phone] (fax)  [facility]

## 2023-08-28 NOTE — Telephone Encounter (Deleted)
 Order was placed last week. What else do they need?

## 2023-08-28 NOTE — Telephone Encounter (Signed)
 Copied from CRM [id number]. Topic: Referral - Question >> Aug 28, 2023  9:01 AM Chasity T wrote: Reason for CRM: Melissa from outpatient rehab in [place] is calling in for patient because she is needing another referral for speech therapy from Dr Gasper. Please fax over at  [phone].

## 2023-08-29 ENCOUNTER — Telehealth: Payer: Self-pay

## 2023-08-29 ENCOUNTER — Ambulatory Visit: Attending: Family Medicine

## 2023-08-29 ENCOUNTER — Ambulatory Visit

## 2023-08-29 DIAGNOSIS — J449 Chronic obstructive pulmonary disease, unspecified: Secondary | ICD-10-CM | POA: Diagnosis not present

## 2023-08-29 DIAGNOSIS — I69398 Other sequelae of cerebral infarction: Secondary | ICD-10-CM | POA: Diagnosis not present

## 2023-08-29 DIAGNOSIS — M6281 Muscle weakness (generalized): Secondary | ICD-10-CM | POA: Diagnosis not present

## 2023-08-29 DIAGNOSIS — I69318 Other symptoms and signs involving cognitive functions following cerebral infarction: Secondary | ICD-10-CM | POA: Diagnosis not present

## 2023-08-29 DIAGNOSIS — I69322 Dysarthria following cerebral infarction: Secondary | ICD-10-CM | POA: Diagnosis not present

## 2023-08-29 DIAGNOSIS — I130 Hypertensive heart and chronic kidney disease with heart failure and stage 1 through stage 4 chronic kidney disease, or unspecified chronic kidney disease: Secondary | ICD-10-CM | POA: Diagnosis not present

## 2023-08-29 NOTE — Telephone Encounter (Signed)
 Dena called and patient is still receiving HH. We cancelled till July 22nd in hopes that she will be ready to return. Daughter will call if something changes.

## 2023-08-29 NOTE — Telephone Encounter (Signed)
 I signed this yesterday and sent it to medical records to be faxed.

## 2023-08-29 NOTE — Therapy (Incomplete)
 OUTPATIENT PHYSICAL THERAPY NEURO EVALUATION   Patient Name: Hannah Kirby MRN: [id number] DOB:01/23/1946, 78 y.o., female Today's Date: 08/29/2023   PCP: [name] Perry, MD REFERRING PROVIDER: [name] Perry, MD  END OF SESSION:   Past Medical History:  Diagnosis Date   Acute hemorrhagic colitis 08/08/2019   Anemia    Anxiety    Arthritis    C. difficile colitis 08/09/2019   CAD (coronary artery disease)    a. 02/2006 PCI: BMS x 2 to RCA, cath o/w without significant coronary disease; b. nuclear stress test 07/2014: No ischemia/infarct; c. 11/2017 MV: no isch/infarct, EF 55-65%; d. 03/2020 NSTEMI/PCI: LM nl, LAD min irregs, RI 25, small, LCX nl, RCA 30p/m ISR, 99d (3.0x15 Resolute Onyx DES).   Cataract 02/04/2018   Cerebrovascular accident (CVA) (HCC) 03/18/2023   dysarthria   Chronic bronchitis (HCC)    secondary to cigarette smoking   Chronic kidney disease (CKD), stage III (moderate) (HCC)    COPD (chronic obstructive pulmonary disease) (HCC)    Diabetes mellitus    Diastolic dysfunction    a. 07/2019 Echo: EF 60-65%, no rwma, mod LVH. Nl RV size/fxn; b. 03/2020 Echo: EF 60-65%, mod LVH, gr1 DD, nl RV size/fxn, mild AS.   FHx: allergies    GERD (gastroesophageal reflux disease)    Goiter    Granulomatous disease (HCC)    Hernia    Hyperlipidemia    Hypertension    Kidney stone on left side 2013   Microalbuminuria    NSTEMI (non-ST elevated myocardial infarction) (HCC) 03/23/2020   Obesity    Oxygen  deficiency    Panic attacks    PVC's (premature ventricular contractions)    a. 03/2018 Zio: Occas PVCs (2.5%). Triggered events assoc w/ PVC/PAC.   Retinal tear 2020   Smokers' cough (HCC)    Spinal stenosis    Stroke (HCC) 10/29/2016   mild left side weakness   Stroke (HCC) 08/01/2019   Tobacco abuse    Past Surgical History:  Procedure Laterality Date   ABDOMINAL HYSTERECTOMY     BREAST SURGERY     CATARACT EXTRACTION W/PHACO Right 03/01/2017   Procedure:  CATARACT EXTRACTION PHACO AND INTRAOCULAR LENS PLACEMENT (IOC) RIGHT DIABETIC;  Surgeon: Mittie Gaskin, MD;  Location: [facility];  Service: Ophthalmology;  Laterality: Right;   CATARACT EXTRACTION W/PHACO Left 03/22/2017   Procedure: CATARACT EXTRACTION PHACO AND INTRAOCULAR LENS PLACEMENT (IOC) LEFT DIABETIC;  Surgeon: Mittie Gaskin, MD;  Location: [facility];  Service: Ophthalmology;  Laterality: Left;  Diabetic - insulin  and oral meds   COLONOSCOPY WITH PROPOFOL  N/A 09/23/2014   Procedure: COLONOSCOPY WITH PROPOFOL ;  Surgeon: Gladis RAYMOND Mariner, MD;  Location: [facility] ENDOSCOPY;  Service: Endoscopy;  Laterality: N/A;   COLONOSCOPY WITH PROPOFOL  N/A 01/11/2018   Procedure: COLONOSCOPY WITH PROPOFOL ;  Surgeon: Mariner Gladis RAYMOND, MD;  Location: [facility] ENDOSCOPY;  Service: Endoscopy;  Laterality: N/A;   COLONOSCOPY WITH PROPOFOL  N/A 04/24/2018   Procedure: COLONOSCOPY WITH PROPOFOL ;  Surgeon: Mariner Gladis RAYMOND, MD;  Location: [facility] ENDOSCOPY;  Service: Endoscopy;  Laterality: N/A;   COLONOSCOPY WITH PROPOFOL  N/A 11/19/2019   Procedure: COLONOSCOPY WITH PROPOFOL ;  Surgeon: Jinny Carmine, MD;  Location: [facility] ENDOSCOPY;  Service: Endoscopy;  Laterality: N/A;   CORONARY ANGIOPLASTY WITH STENT PLACEMENT  2008   CORONARY STENT INTERVENTION N/A 03/24/2020   Procedure: CORONARY STENT INTERVENTION;  Surgeon: Mady Bruckner, MD;  Location: [facility] INVASIVE CV LAB;  Service: Cardiovascular;  Laterality: N/A;   ESOPHAGOGASTRODUODENOSCOPY (EGD) WITH PROPOFOL  N/A 12/30/2014  Procedure: ESOPHAGOGASTRODUODENOSCOPY (EGD) WITH PROPOFOL ;  Surgeon: Gladis RAYMOND Mariner, MD;  Location: [facility] ENDOSCOPY;  Service: Endoscopy;  Laterality: N/A;   ESOPHAGOGASTRODUODENOSCOPY (EGD) WITH PROPOFOL  N/A 07/19/2016   Procedure: ESOPHAGOGASTRODUODENOSCOPY (EGD) WITH PROPOFOL ;  Surgeon: Mariner Gladis RAYMOND, MD;  Location: [facility] ENDOSCOPY;  Service: Endoscopy;  Laterality: N/A;   ESOPHAGOGASTRODUODENOSCOPY (EGD)  WITH PROPOFOL  N/A 04/24/2018   Procedure: ESOPHAGOGASTRODUODENOSCOPY (EGD) WITH PROPOFOL ;  Surgeon: Mariner Gladis RAYMOND, MD;  Location: [facility] ENDOSCOPY;  Service: Endoscopy;  Laterality: N/A;   EYE SURGERY  cataracts, detached retina   hysterectomy (other)     LEFT HEART CATH AND CORONARY ANGIOGRAPHY N/A 03/24/2020   Procedure: LEFT HEART CATH AND CORONARY ANGIOGRAPHY;  Surgeon: Mady Bruckner, MD;  Location: [facility] INVASIVE CV LAB;  Service: Cardiovascular;  Laterality: N/A;   Patient Active Problem List   Diagnosis Date Noted   Stroke (HCC) 08/05/2023   Chronic diastolic CHF (congestive heart failure) (HCC) 08/05/2023   Depression with anxiety 08/05/2023   Chronic kidney disease, stage 3b (HCC) 08/05/2023   Tobacco abuse 08/05/2023   Overweight (BMI 25.0-29.9) 08/05/2023   HLD (hyperlipidemia) 08/05/2023   CAD (coronary artery disease) 08/05/2023   HTN (hypertension) 08/05/2023   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) 03/27/2023   Dysarthria 03/17/2023   Chronic obstructive pulmonary disease (HCC) 03/17/2023   Dizziness 02/23/2023   Dehydration 02/20/2023   Restrictive airway disease 07/21/2020   Partial thickness rotator cuff tear 03/20/2020   Shoulder pain 03/05/2020   Retinal detachment, left 03/04/2020   Ataxia 02/28/2020   Difficulty walking 02/28/2020   Physical deconditioning 02/28/2020   Chronic diastolic congestive heart failure (HCC) 11/20/2019   Polyp of ascending colon    Hypokalemia 08/08/2019   TIA (transient ischemic attack) 08/01/2019   Benign hypertensive kidney disease with chronic kidney disease 04/25/2019   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   Chronic, continuous use of opioids 03/26/2018   History of adenomatous polyp of colon 01/22/2018   Ulceration of colon determined by endoscopy 01/22/2018   Diverticulosis of  colon 01/22/2018   History of CVA (cerebrovascular accident) 11/08/2016   Weakness of left upper extremity 10/30/2016   AAA (abdominal aortic aneurysm) without rupture (HCC) 08/12/2016   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   Stage 3b chronic kidney disease (HCC) 07/27/2015   Chest pain at rest 05/06/2015   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HCC) 10/28/2014   Solitary pulmonary nodule on lung CT 08/20/2014   Allergic rhinitis 07/28/2014   CAFL (chronic airflow limitation) (HCC) 07/28/2014   Acid reflux 07/28/2014   Granuloma annulare    Back pain 11/12/2013   Lumbar scoliosis 10/30/2013   Lumbar radiculopathy 10/30/2013   Lumbar canal stenosis 10/30/2013   Calcium  blood increased 10/22/2013   Proteinuria 10/22/2013   Obesity 03/21/2011   Smokes with greater than 30 pack year history 03/21/2011   Edema 08/18/2010   Hyperlipidemia 03/25/2009   Coronary artery disease 03/25/2009   Primary hypertension 03/25/2009    ONSET DATE: 08/05/23  REFERRING DIAG:  R53.1 (ICD-10-CM) - Weakness  I63.9 (ICD-10-CM) - Cerebral infarction, unspecified    THERAPY DIAG:  No diagnosis found.  Rationale for Evaluation and Treatment: Rehabilitation  SUBJECTIVE:  SUBJECTIVE STATEMENT: *** Pt accompanied by: {accompnied:27141}  PERTINENT HISTORY: Pt is known to this clinic and received previous round of PT for prior CVA in May of this year. Pt was admitted on 08/05/23 due to increasing weakness and SOB and was admitted following MRI findings of small acute infarcts in the left frontal, right parietal, and left cerebellum. Patient has history of prior stroke in January of 2025. Pt is on 2L O2 at baseline due to COPD.   PMH includes: anemia, anxiety, arthritis, CAD, CVA, chronic bronchitis, CKD, COPD, DM,  diastolic dysfunction, GERD, hyperlipidemia, HTN, NSTEMI, oxygen  deficiency, panic attacks, PVCs, spinal stenosis  PAIN:  Are you having pain? {OPRCPAIN:27236}  PRECAUTIONS: {Therapy precautions:24002}  RED FLAGS: {PT Red Flags:29287}   WEIGHT BEARING RESTRICTIONS: {Yes ***/No:24003}  FALLS: Has patient fallen in last 6 months? {fallsyesno:27318}  LIVING ENVIRONMENT: Lives with: {OPRC lives with:25569::lives with their family} Lives in: {Lives in:25570} Stairs: {opstairs:27293} Has following equipment at home: {Assistive devices:23999}  PLOF: {PLOF:24004}  PATIENT GOALS: ***  OBJECTIVE:  Note: Objective measures were completed at Evaluation unless otherwise noted.  DIAGNOSTIC FINDINGS: ***  COGNITION: Overall cognitive status: {cognition:24006}   SENSATION: {sensation:27233}  COORDINATION: ***  EDEMA:  {edema:24020}  MUSCLE TONE: {LE tone:25568}  MUSCLE LENGTH: Hamstrings: Right *** deg; Left *** deg Debby test: Right *** deg; Left *** deg  DTRs:  {DTR SITE:24025}  POSTURE: {posture:25561}  LOWER EXTREMITY ROM:     {AROM/PROM:27142}  Right Eval Left Eval  Hip flexion    Hip extension    Hip abduction    Hip adduction    Hip internal rotation    Hip external rotation    Knee flexion    Knee extension    Ankle dorsiflexion    Ankle plantarflexion    Ankle inversion    Ankle eversion     (Blank rows = not tested)  LOWER EXTREMITY MMT:    MMT Right Eval Left Eval  Hip flexion    Hip extension    Hip abduction    Hip adduction    Hip internal rotation    Hip external rotation    Knee flexion    Knee extension    Ankle dorsiflexion    Ankle plantarflexion    Ankle inversion    Ankle eversion    (Blank rows = not tested)  BED MOBILITY:  {bed mobility:32615:p}  TRANSFERS: {transfers eval:32620}  RAMP:  {ramp eval:32616}  CURB:  {curb eval:32617}  STAIRS: {stairs eval:32618} GAIT: Findings:  {GaitneuroPT:32644::Distance walked: ***,Comments: ***}  FUNCTIONAL TESTS:  {Functional tests:24029}  PATIENT SURVEYS:  {rehab surveys:24030}                                                                                                                              TREATMENT DATE: 08/29/23     PATIENT EDUCATION: Education details: *** Person educated: {Person educated:25204} Education method: {Education Method:25205} Education comprehension: {Education Comprehension:25206}  HOME EXERCISE PROGRAM: ***  GOALS: Goals reviewed with patient? {yes/no:20286}  SHORT TERM GOALS: Target date: ***  *** Baseline: Goal status: INITIAL  2.  *** Baseline:  Goal status: INITIAL  3.  *** Baseline:  Goal status: INITIAL  4.  *** Baseline:  Goal status: INITIAL  5.  *** Baseline:  Goal status: INITIAL  6.  *** Baseline:  Goal status: INITIAL  LONG TERM GOALS: Target date: ***  *** Baseline:  Goal status: INITIAL  2.  *** Baseline:  Goal status: INITIAL  3.  *** Baseline:  Goal status: INITIAL  4.  *** Baseline:  Goal status: INITIAL  5.  *** Baseline:  Goal status: INITIAL  6.  *** Baseline:  Goal status: INITIAL  ASSESSMENT:  CLINICAL IMPRESSION: Patient is a *** y.o. *** who was seen today for physical therapy evaluation and treatment for ***.   OBJECTIVE IMPAIRMENTS: {opptimpairments:25111}.   ACTIVITY LIMITATIONS: {activitylimitations:27494}  PARTICIPATION LIMITATIONS: {participationrestrictions:25113}  PERSONAL FACTORS: {Personal factors:25162} are also affecting patient's functional outcome.   REHAB POTENTIAL: {rehabpotential:25112}  CLINICAL DECISION MAKING: {clinical decision making:25114}  EVALUATION COMPLEXITY: {Evaluation complexity:25115}  PLAN:  PT FREQUENCY: {rehab frequency:25116}  PT DURATION: {rehab duration:25117}  PLANNED INTERVENTIONS: {rehab planned interventions:25118::97110-Therapeutic exercises,97530-  Therapeutic [phone]- Neuromuscular re-education,97535- Self Rjmz,02859- Manual therapy}  PLAN FOR NEXT SESSION: ***   Elizandro Laura, SPT   Janell Axe, Student-PT 08/29/2023, 10:07 AM

## 2023-08-29 NOTE — Telephone Encounter (Signed)
 Pt no showed for ST evaluation. Left voicemail on daughter's cell as she is a preferred contact and the person typically responsible for transporting pt to appointments. Encouraged daughter to call [facility] at [phone] to confirm upcoming appointments.   Delon Bangs, M.S., CCC-SLP Speech-Language Pathologist [facility] [phone] (ASCOM)

## 2023-08-30 ENCOUNTER — Telehealth: Payer: Self-pay | Admitting: Cardiovascular Disease

## 2023-08-30 DIAGNOSIS — I69322 Dysarthria following cerebral infarction: Secondary | ICD-10-CM | POA: Diagnosis not present

## 2023-08-30 DIAGNOSIS — I130 Hypertensive heart and chronic kidney disease with heart failure and stage 1 through stage 4 chronic kidney disease, or unspecified chronic kidney disease: Secondary | ICD-10-CM | POA: Diagnosis not present

## 2023-08-30 DIAGNOSIS — I69398 Other sequelae of cerebral infarction: Secondary | ICD-10-CM | POA: Diagnosis not present

## 2023-08-30 DIAGNOSIS — M6281 Muscle weakness (generalized): Secondary | ICD-10-CM | POA: Diagnosis not present

## 2023-08-30 DIAGNOSIS — J449 Chronic obstructive pulmonary disease, unspecified: Secondary | ICD-10-CM | POA: Diagnosis not present

## 2023-08-30 DIAGNOSIS — I69318 Other symptoms and signs involving cognitive functions following cerebral infarction: Secondary | ICD-10-CM | POA: Diagnosis not present

## 2023-08-30 MED ORDER — METOPROLOL TARTRATE 100 MG PO TABS: 100.0000 mg | ORAL_TABLET | Freq: Two times a day (BID) | ORAL | 3 refills | Status: DC

## 2023-08-30 NOTE — Telephone Encounter (Signed)
 Pt's medication was sent to pt's pharmacy as requested. Confirmation received.

## 2023-08-30 NOTE — Telephone Encounter (Signed)
*  STAT* If patient is at the pharmacy, call can be transferred to refill team.   1. Which medications need to be refilled? (please list name of each medication and dose if known) metoprolol  tartrate (LOPRESSOR ) 100 MG tablet   2. Which pharmacy/location (including street and city if local pharmacy) is medication to be sent to? [facility] - [place], [place] - [address]   3. Do they need a 30 day or 90 day supply? 90

## 2023-08-31 ENCOUNTER — Ambulatory Visit

## 2023-08-31 DIAGNOSIS — R410 Disorientation, unspecified: Secondary | ICD-10-CM | POA: Diagnosis not present

## 2023-08-31 DIAGNOSIS — R270 Ataxia, unspecified: Secondary | ICD-10-CM | POA: Diagnosis not present

## 2023-08-31 DIAGNOSIS — I679 Cerebrovascular disease, unspecified: Secondary | ICD-10-CM | POA: Diagnosis not present

## 2023-08-31 DIAGNOSIS — R202 Paresthesia of skin: Secondary | ICD-10-CM | POA: Diagnosis not present

## 2023-08-31 DIAGNOSIS — Z8673 Personal history of transient ischemic attack (TIA), and cerebral infarction without residual deficits: Secondary | ICD-10-CM | POA: Diagnosis not present

## 2023-08-31 DIAGNOSIS — R519 Headache, unspecified: Secondary | ICD-10-CM | POA: Diagnosis not present

## 2023-08-31 DIAGNOSIS — R200 Anesthesia of skin: Secondary | ICD-10-CM | POA: Diagnosis not present

## 2023-08-31 DIAGNOSIS — Z1331 Encounter for screening for depression: Secondary | ICD-10-CM | POA: Diagnosis not present

## 2023-08-31 DIAGNOSIS — R479 Unspecified speech disturbances: Secondary | ICD-10-CM | POA: Diagnosis not present

## 2023-09-01 DIAGNOSIS — I69318 Other symptoms and signs involving cognitive functions following cerebral infarction: Secondary | ICD-10-CM | POA: Diagnosis not present

## 2023-09-01 DIAGNOSIS — M6281 Muscle weakness (generalized): Secondary | ICD-10-CM | POA: Diagnosis not present

## 2023-09-01 DIAGNOSIS — I69398 Other sequelae of cerebral infarction: Secondary | ICD-10-CM | POA: Diagnosis not present

## 2023-09-01 DIAGNOSIS — I130 Hypertensive heart and chronic kidney disease with heart failure and stage 1 through stage 4 chronic kidney disease, or unspecified chronic kidney disease: Secondary | ICD-10-CM | POA: Diagnosis not present

## 2023-09-01 DIAGNOSIS — J449 Chronic obstructive pulmonary disease, unspecified: Secondary | ICD-10-CM | POA: Diagnosis not present

## 2023-09-01 DIAGNOSIS — I69322 Dysarthria following cerebral infarction: Secondary | ICD-10-CM | POA: Diagnosis not present

## 2023-09-02 DIAGNOSIS — I639 Cerebral infarction, unspecified: Secondary | ICD-10-CM

## 2023-09-03 ENCOUNTER — Ambulatory Visit: Payer: Self-pay | Admitting: Cardiology

## 2023-09-04 DIAGNOSIS — J449 Chronic obstructive pulmonary disease, unspecified: Secondary | ICD-10-CM | POA: Diagnosis not present

## 2023-09-04 DIAGNOSIS — I130 Hypertensive heart and chronic kidney disease with heart failure and stage 1 through stage 4 chronic kidney disease, or unspecified chronic kidney disease: Secondary | ICD-10-CM | POA: Diagnosis not present

## 2023-09-04 DIAGNOSIS — I69322 Dysarthria following cerebral infarction: Secondary | ICD-10-CM | POA: Diagnosis not present

## 2023-09-04 DIAGNOSIS — I69318 Other symptoms and signs involving cognitive functions following cerebral infarction: Secondary | ICD-10-CM | POA: Diagnosis not present

## 2023-09-04 DIAGNOSIS — I69398 Other sequelae of cerebral infarction: Secondary | ICD-10-CM | POA: Diagnosis not present

## 2023-09-04 DIAGNOSIS — M6281 Muscle weakness (generalized): Secondary | ICD-10-CM | POA: Diagnosis not present

## 2023-09-05 ENCOUNTER — Encounter

## 2023-09-05 ENCOUNTER — Encounter: Payer: Self-pay | Admitting: Emergency Medicine

## 2023-09-05 ENCOUNTER — Ambulatory Visit

## 2023-09-05 ENCOUNTER — Ambulatory Visit
Admission: EM | Admit: 2023-09-05 | Discharge: 2023-09-05 | Disposition: A | Discharge status: Home / Self Care | Attending: Emergency Medicine | Admitting: Emergency Medicine

## 2023-09-05 DIAGNOSIS — U071 COVID-19: Secondary | ICD-10-CM

## 2023-09-05 LAB — POC COVID19/FLU A&B COMBO
Covid Antigen, POC: POSITIVE — AB
Influenza A Antigen, POC: NEGATIVE
Influenza B Antigen, POC: NEGATIVE

## 2023-09-05 MED ORDER — BENZONATATE 100 MG PO CAPS: 100.0000 mg | ORAL_CAPSULE | Freq: Three times a day (TID) | ORAL | 0 refills | Status: DC

## 2023-09-05 MED ORDER — PROMETHAZINE-DM 6.25-15 MG/5ML PO SYRP: 2.5000 mL | ORAL_SOLUTION | Freq: Every evening | ORAL | 0 refills | Status: AC | PRN

## 2023-09-05 MED ORDER — AMOXICILLIN-POT CLAVULANATE 875-125 MG PO TABS
1.0000 | ORAL_TABLET | Freq: Two times a day (BID) | ORAL | 0 refills | Status: DC
Start: 2023-09-05 — End: 2023-09-12

## 2023-09-05 MED ORDER — MOLNUPIRAVIR EUA 200MG CAPSULE: 4.0000 | ORAL_CAPSULE | Freq: Two times a day (BID) | ORAL | 0 refills | Status: AC

## 2023-09-05 NOTE — Discharge Instructions (Signed)
 COVID-19 is a virus and should steadily improve in time it can take up to 7 to 10 days before you truly start to see a turnaround however things will get better  Flu test negative  As a protective measure to the airway begin Augmentin  twice daily for 7 days while symptoms do ideally do not progress to pneumonia or bronchitis  May use Tessalon  pill every 8 hours as needed for cough, may use cough syrup at bedtime for additional comfort may purchase over-the-counter Coricidin to use additionally throughout the day and night  Begin use of antiviral taken twice daily for 5 days, this medicine helps to reduce the amount of virus in the body which ideally reduces the severity, if medication is too ends expensive it is not covered by insurance you do not have to take it as this is a virus and will naturally progress and you should get better with time    You can take Tylenol  and/or Ibuprofen as needed for fever reduction and pain relief.   For cough: honey 1/2 to 1 teaspoon (you can dilute the honey in water or another fluid).  Y You can use a humidifier for chest congestion and cough.  If you don't have a humidifier, you can sit in the bathroom with the hot shower running.      For sore throat: try warm salt water gargles, cepacol lozenges, throat spray, warm tea or water with lemon/honey, popsicles or ice, or OTC cold relief medicine for throat discomfort.   It is important to stay hydrated: drink plenty of fluids (water, gatorade/powerade/pedialyte, juices, or teas) to keep your throat moisturized and help further relieve irritation/discomfort.

## 2023-09-05 NOTE — ED Triage Notes (Signed)
 Patient complains of Chest congestion and SOB x 4 days. Non productive cough. Denies CP

## 2023-09-07 ENCOUNTER — Encounter

## 2023-09-07 ENCOUNTER — Ambulatory Visit: Admitting: Physical Therapy

## 2023-09-08 NOTE — ED Provider Notes (Signed)
 Hannah Kirby    CSN: [id number] Arrival date & time: 09/05/23  1903      History   Chief Complaint Chief Complaint  Patient presents with   Cough   Shortness of Breath    HPI [name] is a 78 y.o. female.   Patient presents for evaluation of nasal congestion, productive cough, shortness of breath at rest, intermittent wheezing and headaches present for 3 days.  Wears oxygen  as needed, has placed on today at 5 L, baseline is 3 L.  Has not attempted additional treatment.  Known sick contact.  History of COPD, CHF, daily tobacco use.  Past Medical History:  Diagnosis Date   Acute hemorrhagic colitis 08/08/2019   Anemia    Anxiety    Arthritis    C. difficile colitis 08/09/2019   CAD (coronary artery disease)    a. 02/2006 PCI: BMS x 2 to RCA, cath o/w without significant coronary disease; b. nuclear stress test 07/2014: No ischemia/infarct; c. 11/2017 MV: no isch/infarct, EF 55-65%; d. 03/2020 NSTEMI/PCI: LM nl, LAD min irregs, RI 25, small, LCX nl, RCA 30p/m ISR, 99d (3.0x15 Resolute Onyx DES).   Cataract 02/04/2018   Cerebrovascular accident (CVA) (HCC) 03/18/2023   dysarthria   Chronic bronchitis (HCC)    secondary to cigarette smoking   Chronic kidney disease (CKD), stage III (moderate) (HCC)    COPD (chronic obstructive pulmonary disease) (HCC)    Diabetes mellitus    Diastolic dysfunction    a. 07/2019 Echo: EF 60-65%, no rwma, mod LVH. Nl RV size/fxn; b. 03/2020 Echo: EF 60-65%, mod LVH, gr1 DD, nl RV size/fxn, mild AS.   FHx: allergies    GERD (gastroesophageal reflux disease)    Goiter    Granulomatous disease (HCC)    Hernia    Hyperlipidemia    Hypertension    Kidney stone on left side 2013   Microalbuminuria    NSTEMI (non-ST elevated myocardial infarction) (HCC) 03/23/2020   Obesity    Oxygen  deficiency    Panic attacks    PVC's (premature ventricular contractions)    a. 03/2018 [employer]: Occas PVCs (2.5%). Triggered events assoc w/ PVC/PAC.    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) 03/27/2023   Dysarthria 03/17/2023   Chronic obstructive pulmonary disease (HCC) 03/17/2023   Dizziness 02/23/2023   Dehydration 02/20/2023   Restrictive airway disease 07/21/2020   Partial thickness rotator cuff tear 03/20/2020   Shoulder pain 03/05/2020   Retinal detachment, left 03/04/2020   Ataxia 02/28/2020   Difficulty walking 02/28/2020   Physical deconditioning 02/28/2020   Chronic diastolic congestive heart failure (HCC) 11/20/2019   Polyp of ascending colon    Hypokalemia 08/08/2019   TIA (transient ischemic attack) 08/01/2019   Benign hypertensive kidney disease with chronic kidney disease 04/25/2019   Type 2 diabetes mellitus with diabetic chronic kidney disease (HCC)  04/25/2019   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   Chronic, continuous use of opioids 03/26/2018   History of adenomatous polyp of colon 01/22/2018   Ulceration of colon determined by endoscopy 01/22/2018   Diverticulosis of colon 01/22/2018   History of CVA (cerebrovascular accident) 11/08/2016   Weakness of left upper extremity 10/30/2016   AAA (abdominal aortic aneurysm) without rupture (HCC) 08/12/2016   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   Stage 3b chronic kidney disease (HCC) 07/27/2015   Chest pain at rest 05/06/2015   Diabetes  mellitus with diabetic nephropathy (HCC) 10/28/2014   Solitary pulmonary nodule on lung CT 08/20/2014   Allergic rhinitis 07/28/2014   CAFL (chronic airflow limitation) (HCC) 07/28/2014   Acid reflux 07/28/2014   Granuloma annulare    Back pain 11/12/2013   Lumbar scoliosis 10/30/2013   Lumbar radiculopathy 10/30/2013   Lumbar canal stenosis 10/30/2013   Calcium  blood increased 10/22/2013   Proteinuria 10/22/2013   Obesity 03/21/2011   Smokes with greater than 30 pack year history 03/21/2011   Edema 08/18/2010   Hyperlipidemia 03/25/2009   Coronary artery disease 03/25/2009   Primary hypertension 03/25/2009    Past Surgical History:  Procedure Laterality Date   ABDOMINAL HYSTERECTOMY     BREAST SURGERY     CATARACT EXTRACTION W/PHACO Right 03/01/2017   Procedure: CATARACT EXTRACTION PHACO AND INTRAOCULAR LENS PLACEMENT (IOC) RIGHT DIABETIC;  Surgeon: Mittie Gaskin, MD;  Location: [facility];  Service: Ophthalmology;  Laterality: Right;   CATARACT EXTRACTION W/PHACO Left 03/22/2017   Procedure: CATARACT EXTRACTION PHACO AND INTRAOCULAR LENS PLACEMENT (IOC) LEFT DIABETIC;  Surgeon: Mittie Gaskin, MD;  Location: [facility];  Service: Ophthalmology;  Laterality: Left;  Diabetic - insulin  and oral meds   COLONOSCOPY WITH PROPOFOL  N/A 09/23/2014   Procedure: COLONOSCOPY WITH PROPOFOL ;  Surgeon: Gladis RAYMOND Mariner, MD;  Location: [facility] ENDOSCOPY;  Service: Endoscopy;  Laterality: N/A;   COLONOSCOPY WITH PROPOFOL  N/A 01/11/2018   Procedure: COLONOSCOPY WITH PROPOFOL ;  Surgeon: Mariner Gladis RAYMOND, MD;  Location: [facility] ENDOSCOPY;  Service: Endoscopy;  Laterality: N/A;   COLONOSCOPY WITH PROPOFOL  N/A 04/24/2018   Procedure: COLONOSCOPY WITH PROPOFOL ;  Surgeon: Mariner Gladis RAYMOND, MD;  Location: [facility] ENDOSCOPY;  Service: Endoscopy;  Laterality: N/A;   COLONOSCOPY WITH PROPOFOL  N/A 11/19/2019   Procedure: COLONOSCOPY WITH PROPOFOL ;  Surgeon: Jinny Carmine, MD;   Location: [facility] ENDOSCOPY;  Service: Endoscopy;  Laterality: N/A;   CORONARY ANGIOPLASTY WITH STENT PLACEMENT  2008   CORONARY STENT INTERVENTION N/A 03/24/2020   Procedure: CORONARY STENT INTERVENTION;  Surgeon: Mady Bruckner, MD;  Location: [facility] INVASIVE CV LAB;  Service: Cardiovascular;  Laterality: N/A;   ESOPHAGOGASTRODUODENOSCOPY (EGD) WITH PROPOFOL  N/A 12/30/2014   Procedure: ESOPHAGOGASTRODUODENOSCOPY (EGD) WITH PROPOFOL ;  Surgeon: Gladis RAYMOND Mariner, MD;  Location: [facility] ENDOSCOPY;  Service: Endoscopy;  Laterality: N/A;   ESOPHAGOGASTRODUODENOSCOPY (EGD) WITH PROPOFOL  N/A 07/19/2016   Procedure: ESOPHAGOGASTRODUODENOSCOPY (EGD) WITH PROPOFOL ;  Surgeon: Mariner Gladis RAYMOND, MD;  Location: [facility] ENDOSCOPY;  Service: Endoscopy;  Laterality: N/A;   ESOPHAGOGASTRODUODENOSCOPY (EGD) WITH PROPOFOL  N/A 04/24/2018   Procedure: ESOPHAGOGASTRODUODENOSCOPY (EGD) WITH PROPOFOL ;  Surgeon: Mariner Gladis RAYMOND, MD;  Location: [facility] ENDOSCOPY;  Service: Endoscopy;  Laterality: N/A;   EYE SURGERY  cataracts, detached retina   hysterectomy (other)     LEFT HEART CATH AND CORONARY ANGIOGRAPHY N/A 03/24/2020   Procedure: LEFT HEART CATH AND CORONARY ANGIOGRAPHY;  Surgeon: Mady Bruckner, MD;  Location: [facility] INVASIVE CV LAB;  Service: Cardiovascular;  Laterality: N/A;    OB History  No obstetric history on file.      Home Medications    Prior to Admission medications   Medication Sig Start Date End Date Taking? Authorizing Provider  amoxicillin -clavulanate (AUGMENTIN ) 875-125 MG tablet Take 1 tablet by mouth every 12 (twelve) hours. 09/05/23  Yes Ghislaine Harcum R, NP  benzonatate  (TESSALON ) 100 MG capsule Take 1 capsule (100 mg total) by mouth every 8 (eight) hours. 09/05/23  Yes Maame Dack, Shelba SAUNDERS, NP  molnupiravir  EUA (LAGEVRIO ) 200 mg CAPS capsule Take 4 capsules (800 mg total) by mouth 2 (two) times daily for 5 days. 09/05/23 09/10/23 Yes Teniyah Seivert, Shelba SAUNDERS, NP  promethazine -dextromethorphan   (PROMETHAZINE -DM) 6.25-15 MG/5ML syrup Take 2.5 mLs by mouth at bedtime as needed. 09/05/23  Yes Raini Tiley R, NP  acetaminophen  (TYLENOL ) 500 MG tablet Take 500 mg by mouth every 6 (six) hours as needed.    [provider]  albuterol  (VENTOLIN  HFA) 108 (90 Base) MCG/ACT inhaler Inhale 2 puffs into the lungs every 4 (four) hours as needed for wheezing or shortness of breath. 04/08/22   Gasper [name] BRAVO, MD  aspirin  (ASPIR-81) 81 MG EC tablet Take 81 mg by mouth daily.    [provider]  calcium  carbonate (TUMS - DOSED IN MG ELEMENTAL CALCIUM ) 500 MG chewable tablet Chew 1 tablet by mouth daily as needed.    [provider]  clopidogrel  (PLAVIX ) 75 MG tablet Take 1 tablet (75 mg total) by mouth daily. 08/16/22   Gollan, Timothy J, MD  cyanocobalamin  1000 MCG tablet Take 1,000 mcg by mouth daily.    [provider]  dapagliflozin  propanediol (FARXIGA ) 5 MG TABS tablet Take 1 tablet (5 mg total) by mouth daily before breakfast. Patient taking differently: Take 2.5 mg by mouth every morning. 02/20/23   Donzella Lauraine SAILOR, DO  Docusate Sodium  (DSS) 100 MG CAPS Take 1 capsule by mouth daily.    [provider]  ezetimibe  (ZETIA ) 10 MG tablet TAKE ONE TABLET BY MOUTH EVERY DAY 06/30/23   Gollan, Timothy J, MD  felodipine  (PLENDIL ) 5 MG 24 hr tablet TAKE 1 TABLET BY MOUTH DAILY 06/04/23   Gasper [name] BRAVO, MD  Finerenone  (KERENDIA ) 10 MG TABS Take 10 mg by mouth. 05/31/23 05/30/24  [provider]  Fluticasone -Umeclidin-Vilant (TRELEGY ELLIPTA ) 100-62.5-25 MCG/ACT AEPB INHALE 1 PUFF BY MOUTH EVERY DAY - DISCARD DEVICE 6 WEEKS AFTER IT IS REMOVED FROM THE FOIL TRAY OR WHEN THE DOSE COUNTER READS &quot;0&quot; (WHICHEVER COMES FIRST) 06/10/23   Gasper [name] BRAVO, MD  HYDROcodone -acetaminophen  (NORCO) 7.5-325 MG tablet Take 1 tablet by mouth every 6 (six) hours as needed for moderate pain (pain score 4-6). 03/04/23   Gasper [name] BRAVO, MD  insulin  glargine (LANTUS ) 100  UNIT/ML injection Inject 22 Units into the skin daily.    [provider]  lisinopril  (ZESTRIL ) 20 MG tablet Take 1 tablet (20 mg total) by mouth daily. 08/16/22   Gollan, Timothy J, MD  meclizine  (ANTIVERT ) 25 MG tablet Take 25 mg by mouth 3 (three) times daily as needed. 02/04/22   [provider]  metoprolol  tartrate (LOPRESSOR ) 100 MG tablet Take 1 tablet (100 mg total) by mouth 2 (two) times daily. 08/30/23   Loistine Sober, NP  Multiple Vitamins-Minerals (DAILY MULTI) TABS Take 1 tablet by mouth daily.    [provider]  nitroGLYCERIN  (NITROSTAT ) 0.4 MG SL tablet Place 1 tablet (0.4 mg total) under the tongue every 5 (five) minutes as needed. 08/16/22   Gollan, Timothy J, MD  Omega-3  Fatty Acids (FISH OIL ) 1000 MG CAPS Take 2 capsules by mouth in the morning and at bedtime.     [provider]  ondansetron  (ZOFRAN  ODT) 4 MG disintegrating tablet Take 1 tablet (4 mg total) by mouth every 8 (eight) hours as needed for nausea or vomiting. 08/06/16   Edelmiro, [place], MD  RABEprazole  (ACIPHEX ) 20 MG tablet Take 1 tablet (20 mg total) by mouth daily. 15 Mins. before evening meal 11/08/22   Jinny Carmine, MD  rosuvastatin  (CRESTOR ) 40 MG tablet Take 1 tablet (40 mg total) by mouth daily. 03/30/23   Gasper [name] BRAVO, MD  Semaglutide, 1 MG/DOSE, (OZEMPIC, 1 MG/DOSE,) 4 MG/3ML SOPN Inject 1 mg into the skin once a week. 07/28/23   [provider]  sertraline  (ZOLOFT ) 25 MG tablet Take 25 mg by mouth daily. 06/01/23   [provider]  sucralfate  (CARAFATE ) 1 g tablet Take 1 tablet (1 g total) by mouth 2 (two) times daily. 15 Minutes before evening meal and at bed time 11/08/22   Jinny Carmine, MD    Family History Family History  Problem Relation Age of Onset   Heart failure Mother    Stroke Mother    Diabetes Mother    Congestive Heart Failure Mother    Lung cancer Father    Cancer Father    Breast cancer Maternal Aunt     Social History Social  History   Tobacco Use   Smoking status: Every Day    Current packs/day: 2.00    Average packs/day: 2.0 packs/day for 56.5 years (113.1 ttl pk-yrs)    Types: Cigarettes    Start date: 1969   Smokeless tobacco: Never  Vaping Use   Vaping status: Former  Substance Use Topics   Alcohol use: Yes    Comment: 1 glass of wine every few months on special occassions   Drug use: No     Allergies   Spironolactone , Sulfa antibiotics, Sulfonamide derivatives, Other, Codeine, and Prednisone    Review of Systems Review of Systems  Respiratory:  Positive for cough and shortness of breath.      Physical Exam Triage Vital Signs ED Triage Vitals  Encounter Vitals Group     BP 09/05/23 1920 (!) 167/99     Girls Systolic BP Percentile --      Girls Diastolic BP Percentile --      Boys Systolic BP Percentile --      Boys Diastolic BP Percentile --      Pulse Rate 09/05/23 1920 76     Resp 09/05/23 1920 (!) 24     Temp 09/05/23 1920 98.7 F (37.1 C)     Temp Source 09/05/23 1920 Oral     SpO2 09/05/23 1920 97 %     Weight --      Height --      Head Circumference --      Peak Flow --      Pain Score 09/05/23 1924 0     Pain Loc --      Pain Education --      Exclude from Growth Chart --    No data found.  Updated Vital Signs BP (!) 167/99 (BP Location: Right Arm)   Pulse 76   Temp 98.7 F (37.1 C) (Oral)   Resp (!) 24   SpO2 97%   Visual Acuity Right Eye Distance:   Left Eye Distance:   Bilateral Distance:    Right Eye Near:   Left Eye Near:  Bilateral Near:     Physical Exam Constitutional:      Appearance: Normal appearance.  HENT:     Right Ear: Tympanic membrane, ear canal and external ear normal.     Left Ear: Tympanic membrane, ear canal and external ear normal.     Nose: Congestion present.     Mouth/Throat:     Mouth: Mucous membranes are moist.     Pharynx: Oropharynx is clear.  Eyes:     Extraocular Movements: Extraocular movements intact.   Cardiovascular:     Rate and Rhythm: Normal rate and regular rhythm.     Pulses: Normal pulses.     Heart sounds: Normal heart sounds.  Pulmonary:     Effort: Pulmonary effort is normal.     Breath sounds: Wheezing present.  Neurological:     Mental Status: She is alert and oriented to person, place, and time. Mental status is at baseline.      UC Treatments / Results  Labs (all labs ordered are listed, but only abnormal results are displayed) Labs Reviewed  POC COVID19/FLU A&B COMBO - Abnormal; Notable for the following components:      Result Value   Covid Antigen, POC Positive (*)    All other components within normal limits    EKG   Radiology No results found.  Procedures Procedures (including critical care time)  Medications Ordered in UC Medications - No data to display  Initial Impression / Assessment and Plan / UC Course  I have reviewed the triage vital signs and the nursing notes.  Pertinent labs & imaging results that were available during my care of the patient were reviewed by me and considered in my medical decision making (see chart for details).  COVID-19  Patient is in no signs of distress nor toxic appearing.  Vital signs are stable.  Low suspicion for pneumonia, pneumothorax or bronchitis and therefore will defer imaging.  Discussed quarantine and etiology.  Prescribe antiviral, Augmentin , Tessalon , allergy and intolerance to prednisone  therefore deferred.  Recommended continued use of all daily inhalers.  Given ER precautions.May use additional over-the-counter medications as needed for supportive care.  May follow-up with urgent care as needed if symptoms persist or worsen.     Final Clinical Impressions(s) / UC Diagnoses   Final diagnoses:  COVID-19     Discharge Instructions      COVID-19 is a virus and should steadily improve in time it can take up to 7 to 10 days before you truly start to see a turnaround however things will get  better  Flu test negative  As a protective measure to the airway begin Augmentin  twice daily for 7 days while symptoms do ideally do not progress to pneumonia or bronchitis  May use Tessalon  pill every 8 hours as needed for cough, may use cough syrup at bedtime for additional comfort may purchase over-the-counter Coricidin to use additionally throughout the day and night  Begin use of antiviral taken twice daily for 5 days, this medicine helps to reduce the amount of virus in the body which ideally reduces the severity, if medication is too ends expensive it is not covered by insurance you do not have to take it as this is a virus and will naturally progress and you should get better with time    You can take Tylenol  and/or Ibuprofen as needed for fever reduction and pain relief.   For cough: honey 1/2 to 1 teaspoon (you can dilute the honey in water  or another fluid).  Y You can use a humidifier for chest congestion and cough.  If you don't have a humidifier, you can sit in the bathroom with the hot shower running.      For sore throat: try warm salt water gargles, cepacol lozenges, throat spray, warm tea or water with lemon/honey, popsicles or ice, or OTC cold relief medicine for throat discomfort.   It is important to stay hydrated: drink plenty of fluids (water, gatorade/powerade/pedialyte, juices, or teas) to keep your throat moisturized and help further relieve irritation/discomfort.    ED Prescriptions     Medication Sig Dispense Auth. Provider   benzonatate  (TESSALON ) 100 MG capsule Take 1 capsule (100 mg total) by mouth every 8 (eight) hours. 21 capsule Blayne Garlick R, NP   promethazine -dextromethorphan  (PROMETHAZINE -DM) 6.25-15 MG/5ML syrup Take 2.5 mLs by mouth at bedtime as needed. 118 mL Sheppard Luckenbach, Shelba R, NP   amoxicillin -clavulanate (AUGMENTIN ) 875-125 MG tablet Take 1 tablet by mouth every 12 (twelve) hours. 14 tablet Zamyiah Tino R, NP   molnupiravir  EUA (LAGEVRIO )  200 mg CAPS capsule Take 4 capsules (800 mg total) by mouth 2 (two) times daily for 5 days. 40 capsule Neilani Duffee R, NP      PDMP not reviewed this encounter.   Teresa Shelba SAUNDERS, [place] 09/08/23 [phone]

## 2023-09-09 DIAGNOSIS — I2510 Atherosclerotic heart disease of native coronary artery without angina pectoris: Secondary | ICD-10-CM | POA: Diagnosis not present

## 2023-09-09 DIAGNOSIS — I130 Hypertensive heart and chronic kidney disease with heart failure and stage 1 through stage 4 chronic kidney disease, or unspecified chronic kidney disease: Secondary | ICD-10-CM | POA: Diagnosis not present

## 2023-09-09 DIAGNOSIS — F418 Other specified anxiety disorders: Secondary | ICD-10-CM | POA: Diagnosis not present

## 2023-09-09 DIAGNOSIS — I69322 Dysarthria following cerebral infarction: Secondary | ICD-10-CM | POA: Diagnosis not present

## 2023-09-09 DIAGNOSIS — Z9981 Dependence on supplemental oxygen: Secondary | ICD-10-CM | POA: Diagnosis not present

## 2023-09-09 DIAGNOSIS — Z720 Tobacco use: Secondary | ICD-10-CM | POA: Diagnosis not present

## 2023-09-09 DIAGNOSIS — M6281 Muscle weakness (generalized): Secondary | ICD-10-CM | POA: Diagnosis not present

## 2023-09-09 DIAGNOSIS — J449 Chronic obstructive pulmonary disease, unspecified: Secondary | ICD-10-CM | POA: Diagnosis not present

## 2023-09-09 DIAGNOSIS — N1832 Chronic kidney disease, stage 3b: Secondary | ICD-10-CM | POA: Diagnosis not present

## 2023-09-09 DIAGNOSIS — E785 Hyperlipidemia, unspecified: Secondary | ICD-10-CM | POA: Diagnosis not present

## 2023-09-09 DIAGNOSIS — K219 Gastro-esophageal reflux disease without esophagitis: Secondary | ICD-10-CM | POA: Diagnosis not present

## 2023-09-09 DIAGNOSIS — I69398 Other sequelae of cerebral infarction: Secondary | ICD-10-CM | POA: Diagnosis not present

## 2023-09-09 DIAGNOSIS — I69318 Other symptoms and signs involving cognitive functions following cerebral infarction: Secondary | ICD-10-CM | POA: Diagnosis not present

## 2023-09-09 DIAGNOSIS — I5032 Chronic diastolic (congestive) heart failure: Secondary | ICD-10-CM | POA: Diagnosis not present

## 2023-09-09 DIAGNOSIS — E1122 Type 2 diabetes mellitus with diabetic chronic kidney disease: Secondary | ICD-10-CM | POA: Diagnosis not present

## 2023-09-11 ENCOUNTER — Ambulatory Visit: Payer: Self-pay | Admitting: Family Medicine

## 2023-09-11 DIAGNOSIS — I130 Hypertensive heart and chronic kidney disease with heart failure and stage 1 through stage 4 chronic kidney disease, or unspecified chronic kidney disease: Secondary | ICD-10-CM | POA: Diagnosis not present

## 2023-09-11 DIAGNOSIS — E1122 Type 2 diabetes mellitus with diabetic chronic kidney disease: Secondary | ICD-10-CM | POA: Diagnosis not present

## 2023-09-11 DIAGNOSIS — I5032 Chronic diastolic (congestive) heart failure: Secondary | ICD-10-CM | POA: Diagnosis not present

## 2023-09-11 DIAGNOSIS — I69322 Dysarthria following cerebral infarction: Secondary | ICD-10-CM | POA: Diagnosis not present

## 2023-09-11 DIAGNOSIS — M6281 Muscle weakness (generalized): Secondary | ICD-10-CM | POA: Diagnosis not present

## 2023-09-11 DIAGNOSIS — F418 Other specified anxiety disorders: Secondary | ICD-10-CM | POA: Diagnosis not present

## 2023-09-11 DIAGNOSIS — I2510 Atherosclerotic heart disease of native coronary artery without angina pectoris: Secondary | ICD-10-CM | POA: Diagnosis not present

## 2023-09-11 DIAGNOSIS — N1832 Chronic kidney disease, stage 3b: Secondary | ICD-10-CM | POA: Diagnosis not present

## 2023-09-11 DIAGNOSIS — I69398 Other sequelae of cerebral infarction: Secondary | ICD-10-CM | POA: Diagnosis not present

## 2023-09-11 DIAGNOSIS — J449 Chronic obstructive pulmonary disease, unspecified: Secondary | ICD-10-CM | POA: Diagnosis not present

## 2023-09-11 DIAGNOSIS — K219 Gastro-esophageal reflux disease without esophagitis: Secondary | ICD-10-CM | POA: Diagnosis not present

## 2023-09-11 DIAGNOSIS — I69318 Other symptoms and signs involving cognitive functions following cerebral infarction: Secondary | ICD-10-CM | POA: Diagnosis not present

## 2023-09-12 ENCOUNTER — Ambulatory Visit

## 2023-09-12 ENCOUNTER — Ambulatory Visit: Admitting: Speech Pathology

## 2023-09-14 ENCOUNTER — Encounter

## 2023-09-14 ENCOUNTER — Ambulatory Visit

## 2023-09-14 DIAGNOSIS — J449 Chronic obstructive pulmonary disease, unspecified: Secondary | ICD-10-CM | POA: Diagnosis not present

## 2023-09-14 DIAGNOSIS — I69398 Other sequelae of cerebral infarction: Secondary | ICD-10-CM | POA: Diagnosis not present

## 2023-09-14 DIAGNOSIS — I69322 Dysarthria following cerebral infarction: Secondary | ICD-10-CM | POA: Diagnosis not present

## 2023-09-14 DIAGNOSIS — I69318 Other symptoms and signs involving cognitive functions following cerebral infarction: Secondary | ICD-10-CM | POA: Diagnosis not present

## 2023-09-14 DIAGNOSIS — M6281 Muscle weakness (generalized): Secondary | ICD-10-CM | POA: Diagnosis not present

## 2023-09-14 DIAGNOSIS — I130 Hypertensive heart and chronic kidney disease with heart failure and stage 1 through stage 4 chronic kidney disease, or unspecified chronic kidney disease: Secondary | ICD-10-CM | POA: Diagnosis not present

## 2023-09-17 ENCOUNTER — Ambulatory Visit (INDEPENDENT_AMBULATORY_CARE_PROVIDER_SITE_OTHER)

## 2023-09-17 DIAGNOSIS — I639 Cerebral infarction, unspecified: Secondary | ICD-10-CM

## 2023-09-18 ENCOUNTER — Telehealth: Payer: Self-pay

## 2023-09-18 LAB — CUP PACEART REMOTE DEVICE CHECK
Date Time Interrogation Session: 20250727104139
Implantable Pulse Generator Implant Date: 20250626

## 2023-09-18 NOTE — Telephone Encounter (Signed)
 LVM of evaluations scheduled for tomorrow and asked the daughter to call if she is still receiving HH. I left our phone number for her to call us  back

## 2023-09-19 ENCOUNTER — Ambulatory Visit

## 2023-09-19 DIAGNOSIS — I69398 Other sequelae of cerebral infarction: Secondary | ICD-10-CM | POA: Diagnosis not present

## 2023-09-19 DIAGNOSIS — J449 Chronic obstructive pulmonary disease, unspecified: Secondary | ICD-10-CM | POA: Diagnosis not present

## 2023-09-19 DIAGNOSIS — I69322 Dysarthria following cerebral infarction: Secondary | ICD-10-CM | POA: Diagnosis not present

## 2023-09-19 DIAGNOSIS — I130 Hypertensive heart and chronic kidney disease with heart failure and stage 1 through stage 4 chronic kidney disease, or unspecified chronic kidney disease: Secondary | ICD-10-CM | POA: Diagnosis not present

## 2023-09-19 DIAGNOSIS — M6281 Muscle weakness (generalized): Secondary | ICD-10-CM | POA: Diagnosis not present

## 2023-09-19 DIAGNOSIS — I69318 Other symptoms and signs involving cognitive functions following cerebral infarction: Secondary | ICD-10-CM | POA: Diagnosis not present

## 2023-09-20 ENCOUNTER — Encounter: Payer: Self-pay | Admitting: Acute Care

## 2023-09-21 ENCOUNTER — Ambulatory Visit

## 2023-09-21 ENCOUNTER — Encounter

## 2023-09-21 DIAGNOSIS — J449 Chronic obstructive pulmonary disease, unspecified: Secondary | ICD-10-CM | POA: Diagnosis not present

## 2023-09-21 DIAGNOSIS — I69318 Other symptoms and signs involving cognitive functions following cerebral infarction: Secondary | ICD-10-CM | POA: Diagnosis not present

## 2023-09-21 DIAGNOSIS — I130 Hypertensive heart and chronic kidney disease with heart failure and stage 1 through stage 4 chronic kidney disease, or unspecified chronic kidney disease: Secondary | ICD-10-CM | POA: Diagnosis not present

## 2023-09-21 DIAGNOSIS — M6281 Muscle weakness (generalized): Secondary | ICD-10-CM | POA: Diagnosis not present

## 2023-09-21 DIAGNOSIS — I69322 Dysarthria following cerebral infarction: Secondary | ICD-10-CM | POA: Diagnosis not present

## 2023-09-21 DIAGNOSIS — I69398 Other sequelae of cerebral infarction: Secondary | ICD-10-CM | POA: Diagnosis not present

## 2023-09-22 ENCOUNTER — Encounter

## 2023-09-22 DIAGNOSIS — I69318 Other symptoms and signs involving cognitive functions following cerebral infarction: Secondary | ICD-10-CM | POA: Diagnosis not present

## 2023-09-22 DIAGNOSIS — I69322 Dysarthria following cerebral infarction: Secondary | ICD-10-CM | POA: Diagnosis not present

## 2023-09-22 DIAGNOSIS — J449 Chronic obstructive pulmonary disease, unspecified: Secondary | ICD-10-CM | POA: Diagnosis not present

## 2023-09-22 DIAGNOSIS — M6281 Muscle weakness (generalized): Secondary | ICD-10-CM | POA: Diagnosis not present

## 2023-09-22 DIAGNOSIS — I69398 Other sequelae of cerebral infarction: Secondary | ICD-10-CM | POA: Diagnosis not present

## 2023-09-22 DIAGNOSIS — I130 Hypertensive heart and chronic kidney disease with heart failure and stage 1 through stage 4 chronic kidney disease, or unspecified chronic kidney disease: Secondary | ICD-10-CM | POA: Diagnosis not present

## 2023-09-24 ENCOUNTER — Ambulatory Visit: Payer: Self-pay | Admitting: Cardiology

## 2023-09-26 ENCOUNTER — Ambulatory Visit

## 2023-09-26 ENCOUNTER — Encounter

## 2023-09-27 DIAGNOSIS — I69318 Other symptoms and signs involving cognitive functions following cerebral infarction: Secondary | ICD-10-CM | POA: Diagnosis not present

## 2023-09-27 DIAGNOSIS — I69322 Dysarthria following cerebral infarction: Secondary | ICD-10-CM | POA: Diagnosis not present

## 2023-09-27 DIAGNOSIS — I69398 Other sequelae of cerebral infarction: Secondary | ICD-10-CM | POA: Diagnosis not present

## 2023-09-27 DIAGNOSIS — M6281 Muscle weakness (generalized): Secondary | ICD-10-CM | POA: Diagnosis not present

## 2023-09-27 DIAGNOSIS — I130 Hypertensive heart and chronic kidney disease with heart failure and stage 1 through stage 4 chronic kidney disease, or unspecified chronic kidney disease: Secondary | ICD-10-CM | POA: Diagnosis not present

## 2023-09-27 DIAGNOSIS — J449 Chronic obstructive pulmonary disease, unspecified: Secondary | ICD-10-CM | POA: Diagnosis not present

## 2023-09-28 ENCOUNTER — Ambulatory Visit

## 2023-10-02 ENCOUNTER — Telehealth: Payer: Self-pay

## 2023-10-02 NOTE — Telephone Encounter (Signed)
 That's fine

## 2023-10-02 NOTE — Telephone Encounter (Signed)
 Copied from CRM [id number]. Topic: General - Other >> Sep 29, 2023  4:28 PM Santiya F wrote: Reason for CRM: Darice Mayer with [facility] is calling in requesting approval to see patient twice next week instead of once. She was unable to see patient this week and instead of doing it as a missed visit, she wanted to just move the visit to next week. [phone]

## 2023-10-03 ENCOUNTER — Ambulatory Visit

## 2023-10-03 DIAGNOSIS — I69322 Dysarthria following cerebral infarction: Secondary | ICD-10-CM | POA: Diagnosis not present

## 2023-10-03 DIAGNOSIS — I69318 Other symptoms and signs involving cognitive functions following cerebral infarction: Secondary | ICD-10-CM | POA: Diagnosis not present

## 2023-10-03 DIAGNOSIS — I69398 Other sequelae of cerebral infarction: Secondary | ICD-10-CM | POA: Diagnosis not present

## 2023-10-03 DIAGNOSIS — I130 Hypertensive heart and chronic kidney disease with heart failure and stage 1 through stage 4 chronic kidney disease, or unspecified chronic kidney disease: Secondary | ICD-10-CM | POA: Diagnosis not present

## 2023-10-03 DIAGNOSIS — M6281 Muscle weakness (generalized): Secondary | ICD-10-CM | POA: Diagnosis not present

## 2023-10-03 DIAGNOSIS — J449 Chronic obstructive pulmonary disease, unspecified: Secondary | ICD-10-CM | POA: Diagnosis not present

## 2023-10-04 ENCOUNTER — Telehealth: Payer: Self-pay

## 2023-10-04 ENCOUNTER — Telehealth: Payer: Self-pay | Admitting: Family Medicine

## 2023-10-04 DIAGNOSIS — Z8673 Personal history of transient ischemic attack (TIA), and cerebral infarction without residual deficits: Secondary | ICD-10-CM

## 2023-10-04 DIAGNOSIS — R270 Ataxia, unspecified: Secondary | ICD-10-CM

## 2023-10-04 NOTE — Telephone Encounter (Signed)
 Patient's daughter is asking for an order be placed for her to take Arriah back to outpatient rehab at [facility] for PT and speech therapy.  Daughter states that home health is not doing her any good.

## 2023-10-04 NOTE — Addendum Note (Signed)
 Addended by: GASPER [name] BRAVO on: 10/04/2023 04:33 PM   Modules accepted: Orders

## 2023-10-04 NOTE — Telephone Encounter (Signed)
 I spoke with Hannah Kirby and she said [facility] will be discharging this week and she plans to start back Tuesday. I asked her to call if anything changes

## 2023-10-04 NOTE — Telephone Encounter (Signed)
 Order placed

## 2023-10-05 ENCOUNTER — Ambulatory Visit

## 2023-10-05 DIAGNOSIS — I69398 Other sequelae of cerebral infarction: Secondary | ICD-10-CM | POA: Diagnosis not present

## 2023-10-05 DIAGNOSIS — J449 Chronic obstructive pulmonary disease, unspecified: Secondary | ICD-10-CM | POA: Diagnosis not present

## 2023-10-05 DIAGNOSIS — I69322 Dysarthria following cerebral infarction: Secondary | ICD-10-CM | POA: Diagnosis not present

## 2023-10-05 DIAGNOSIS — I130 Hypertensive heart and chronic kidney disease with heart failure and stage 1 through stage 4 chronic kidney disease, or unspecified chronic kidney disease: Secondary | ICD-10-CM | POA: Diagnosis not present

## 2023-10-05 DIAGNOSIS — I69318 Other symptoms and signs involving cognitive functions following cerebral infarction: Secondary | ICD-10-CM | POA: Diagnosis not present

## 2023-10-05 DIAGNOSIS — M6281 Muscle weakness (generalized): Secondary | ICD-10-CM | POA: Diagnosis not present

## 2023-10-05 NOTE — Telephone Encounter (Signed)
 LM for Hannah Kirby with [facility] to get more information on what is needed

## 2023-10-05 NOTE — Telephone Encounter (Unsigned)
 Copied from CRM #[id number]. Topic: General - Other >> Oct 05, 2023  2:02 PM Delon DASEN wrote: Reason for CRM: Damien with [facility]- need a call back about fax sent- [phone] can leave vm

## 2023-10-06 DIAGNOSIS — M6281 Muscle weakness (generalized): Secondary | ICD-10-CM | POA: Diagnosis not present

## 2023-10-06 DIAGNOSIS — J449 Chronic obstructive pulmonary disease, unspecified: Secondary | ICD-10-CM | POA: Diagnosis not present

## 2023-10-06 DIAGNOSIS — I130 Hypertensive heart and chronic kidney disease with heart failure and stage 1 through stage 4 chronic kidney disease, or unspecified chronic kidney disease: Secondary | ICD-10-CM | POA: Diagnosis not present

## 2023-10-06 DIAGNOSIS — I69318 Other symptoms and signs involving cognitive functions following cerebral infarction: Secondary | ICD-10-CM | POA: Diagnosis not present

## 2023-10-06 DIAGNOSIS — I69322 Dysarthria following cerebral infarction: Secondary | ICD-10-CM | POA: Diagnosis not present

## 2023-10-06 DIAGNOSIS — I69398 Other sequelae of cerebral infarction: Secondary | ICD-10-CM | POA: Diagnosis not present

## 2023-10-06 NOTE — Telephone Encounter (Signed)
 2nd attempt to contact Hannah Kirby/LM message yesterday.

## 2023-10-10 ENCOUNTER — Ambulatory Visit

## 2023-10-11 ENCOUNTER — Ambulatory Visit: Admitting: Family Medicine

## 2023-10-11 ENCOUNTER — Telehealth: Payer: Self-pay | Admitting: Family Medicine

## 2023-10-11 NOTE — Telephone Encounter (Signed)
 Copied from CRM #[id number]. Topic: General - Other >> Oct 05, 2023  2:02 PM Delon DASEN wrote: Reason for CRM: Damien with [facility]- need a call back about fax sent- [phone] can leave vm >> Oct 11, 2023 10:10 AM Donna BRAVO wrote: Damien with [employer] calling, asking for Dr Cleola staff  note on 10/06/23 Rosas, Joseline E, CMA    10/06/23  3:10 PM Note 2nd attempt to contact Damien Wilbur/LM message yesterday.  Joseline is on vacation this week.   The office Aug 8th sent fax for the speech generating device. Dr Donzella signed on behalf of Dr Gasper roulette is okay. on the form the prescribers information, Dr Gasper name and NPI needs to be crossed out where is says prescribers name on page 4    Keep Dr Donzella  signature and NPI on the signature line. Please fax form to both of these numbers [phone]  and   [phone]

## 2023-10-12 ENCOUNTER — Ambulatory Visit

## 2023-10-12 NOTE — Therapy (Incomplete)
 OUTPATIENT PHYSICAL THERAPY NEURO EVALUATION   Patient Name: Hannah Kirby MRN: [id number] DOB:17-Oct-1945, 78 y.o., female Today's Date: 10/12/2023   PCP: Gasper [name] BRAVO, MD REFERRING PROVIDER: Gasper [name] BRAVO, MD  END OF SESSION:   Past Medical History:  Diagnosis Date   Acute hemorrhagic colitis 08/08/2019   Anemia    Anxiety    Arthritis    C. difficile colitis 08/09/2019   CAD (coronary artery disease)    a. 02/2006 PCI: BMS x 2 to RCA, cath o/w without significant coronary disease; b. nuclear stress test 07/2014: No ischemia/infarct; c. 11/2017 MV: no isch/infarct, EF 55-65%; d. 03/2020 NSTEMI/PCI: LM nl, LAD min irregs, RI 25, small, LCX nl, RCA 30p/m ISR, 99d (3.0x15 Resolute Onyx DES).   Cataract 02/04/2018   Cerebrovascular accident (CVA) (HCC) 03/18/2023   dysarthria   Chronic bronchitis (HCC)    secondary to cigarette smoking   Chronic kidney disease (CKD), stage III (moderate) (HCC)    COPD (chronic obstructive pulmonary disease) (HCC)    Diabetes mellitus    Diastolic dysfunction    a. 07/2019 Echo: EF 60-65%, no rwma, mod LVH. Nl RV size/fxn; b. 03/2020 Echo: EF 60-65%, mod LVH, gr1 DD, nl RV size/fxn, mild AS.   FHx: allergies    GERD (gastroesophageal reflux disease)    Goiter    Granulomatous disease (HCC)    Hernia    Hyperlipidemia    Hypertension    Kidney stone on left side 2013   Microalbuminuria    NSTEMI (non-ST elevated myocardial infarction) (HCC) 03/23/2020   Obesity    Oxygen  deficiency    Panic attacks    PVC's (premature ventricular contractions)    a. 03/2018 Zio: Occas PVCs (2.5%). Triggered events assoc w/ PVC/PAC.   Retinal tear 2020   Smokers' cough (HCC)    Spinal stenosis    Stroke (HCC) 10/29/2016   mild left side weakness   Stroke (HCC) 08/01/2019   Tobacco abuse    Past Surgical History:  Procedure Laterality Date   ABDOMINAL HYSTERECTOMY     BREAST SURGERY     CATARACT EXTRACTION W/PHACO Right 03/01/2017    Procedure: CATARACT EXTRACTION PHACO AND INTRAOCULAR LENS PLACEMENT (IOC) RIGHT DIABETIC;  Surgeon: Mittie Gaskin, MD;  Location: [facility];  Service: Ophthalmology;  Laterality: Right;   CATARACT EXTRACTION W/PHACO Left 03/22/2017   Procedure: CATARACT EXTRACTION PHACO AND INTRAOCULAR LENS PLACEMENT (IOC) LEFT DIABETIC;  Surgeon: Mittie Gaskin, MD;  Location: [facility];  Service: Ophthalmology;  Laterality: Left;  Diabetic - insulin  and oral meds   COLONOSCOPY WITH PROPOFOL  N/A 09/23/2014   Procedure: COLONOSCOPY WITH PROPOFOL ;  Surgeon: Gladis RAYMOND Mariner, MD;  Location: [facility] ENDOSCOPY;  Service: Endoscopy;  Laterality: N/A;   COLONOSCOPY WITH PROPOFOL  N/A 01/11/2018   Procedure: COLONOSCOPY WITH PROPOFOL ;  Surgeon: Mariner Gladis RAYMOND, MD;  Location: [facility] ENDOSCOPY;  Service: Endoscopy;  Laterality: N/A;   COLONOSCOPY WITH PROPOFOL  N/A 04/24/2018   Procedure: COLONOSCOPY WITH PROPOFOL ;  Surgeon: Mariner Gladis RAYMOND, MD;  Location: [facility] ENDOSCOPY;  Service: Endoscopy;  Laterality: N/A;   COLONOSCOPY WITH PROPOFOL  N/A 11/19/2019   Procedure: COLONOSCOPY WITH PROPOFOL ;  Surgeon: Jinny Carmine, MD;  Location: [facility] ENDOSCOPY;  Service: Endoscopy;  Laterality: N/A;   CORONARY ANGIOPLASTY WITH STENT PLACEMENT  2008   CORONARY STENT INTERVENTION N/A 03/24/2020   Procedure: CORONARY STENT INTERVENTION;  Surgeon: Mady Bruckner, MD;  Location: [facility] INVASIVE CV LAB;  Service: Cardiovascular;  Laterality: N/A;   ESOPHAGOGASTRODUODENOSCOPY (EGD) WITH PROPOFOL  N/A  12/30/2014   Procedure: ESOPHAGOGASTRODUODENOSCOPY (EGD) WITH PROPOFOL ;  Surgeon: Gladis RAYMOND Mariner, MD;  Location: [facility] ENDOSCOPY;  Service: Endoscopy;  Laterality: N/A;   ESOPHAGOGASTRODUODENOSCOPY (EGD) WITH PROPOFOL  N/A 07/19/2016   Procedure: ESOPHAGOGASTRODUODENOSCOPY (EGD) WITH PROPOFOL ;  Surgeon: Mariner Gladis RAYMOND, MD;  Location: [facility] ENDOSCOPY;  Service: Endoscopy;  Laterality: N/A;    ESOPHAGOGASTRODUODENOSCOPY (EGD) WITH PROPOFOL  N/A 04/24/2018   Procedure: ESOPHAGOGASTRODUODENOSCOPY (EGD) WITH PROPOFOL ;  Surgeon: Mariner Gladis RAYMOND, MD;  Location: [facility] ENDOSCOPY;  Service: Endoscopy;  Laterality: N/A;   EYE SURGERY  cataracts, detached retina   hysterectomy (other)     LEFT HEART CATH AND CORONARY ANGIOGRAPHY N/A 03/24/2020   Procedure: LEFT HEART CATH AND CORONARY ANGIOGRAPHY;  Surgeon: Mady Bruckner, MD;  Location: [facility] INVASIVE CV LAB;  Service: Cardiovascular;  Laterality: N/A;   Patient Active Problem List   Diagnosis Date Noted   Stroke (HCC) 08/05/2023   Chronic diastolic CHF (congestive heart failure) (HCC) 08/05/2023   Depression with anxiety 08/05/2023   Chronic kidney disease, stage 3b (HCC) 08/05/2023   Tobacco abuse 08/05/2023   Overweight (BMI 25.0-29.9) 08/05/2023   HLD (hyperlipidemia) 08/05/2023   CAD (coronary artery disease) 08/05/2023   HTN (hypertension) 08/05/2023   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) 03/27/2023   Dysarthria 03/17/2023   Chronic obstructive pulmonary disease (HCC) 03/17/2023   Dizziness 02/23/2023   Dehydration 02/20/2023   Restrictive airway disease 07/21/2020   Partial thickness rotator cuff tear 03/20/2020   Shoulder pain 03/05/2020   Retinal detachment, left 03/04/2020   Ataxia 02/28/2020   Difficulty walking 02/28/2020   Physical deconditioning 02/28/2020   Chronic diastolic congestive heart failure (HCC) 11/20/2019   Polyp of ascending colon    Hypokalemia 08/08/2019   TIA (transient ischemic attack) 08/01/2019   Benign hypertensive kidney disease with chronic kidney disease 04/25/2019   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   Chronic, continuous use of opioids 03/26/2018   History of adenomatous polyp of colon 01/22/2018   Ulceration of colon determined by endoscopy  01/22/2018   Diverticulosis of colon 01/22/2018   History of CVA (cerebrovascular accident) 11/08/2016   Weakness of left upper extremity 10/30/2016   AAA (abdominal aortic aneurysm) without rupture (HCC) 08/12/2016   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   Stage 3b chronic kidney disease (HCC) 07/27/2015   Chest pain at rest 05/06/2015   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HCC) 10/28/2014   Solitary pulmonary nodule on lung CT 08/20/2014   Allergic rhinitis 07/28/2014   CAFL (chronic airflow limitation) (HCC) 07/28/2014   Acid reflux 07/28/2014   Granuloma annulare    Back pain 11/12/2013   Lumbar scoliosis 10/30/2013   Lumbar radiculopathy 10/30/2013   Lumbar canal stenosis 10/30/2013   Calcium  blood increased 10/22/2013   Proteinuria 10/22/2013   Obesity 03/21/2011   Smokes with greater than 30 pack year history 03/21/2011   Edema 08/18/2010   Hyperlipidemia 03/25/2009   Coronary artery disease 03/25/2009   Primary hypertension 03/25/2009    ONSET DATE: ***  REFERRING DIAG:  Z86.73 (ICD-10-CM) - History of CVA (cerebrovascular accident)  R27.0 (ICD-10-CM) - Ataxia    THERAPY DIAG:  No diagnosis found.  Rationale for Evaluation and Treatment: {HABREHAB:27488}  SUBJECTIVE:  SUBJECTIVE STATEMENT: *** Pt accompanied by: {accompnied:27141}  PERTINENT HISTORY: ***  PAIN:  Are you having pain? {OPRCPAIN:27236}  PRECAUTIONS: {Therapy precautions:24002}  RED FLAGS: {PT Red Flags:29287}   WEIGHT BEARING RESTRICTIONS: {Yes ***/No:24003}  FALLS: Has patient fallen in last 6 months? {fallsyesno:27318}  LIVING ENVIRONMENT: Lives with: {OPRC lives with:25569::lives with their family} Lives in: {Lives in:25570} Stairs: {opstairs:27293} Has following equipment at home: {Assistive  devices:23999}  PLOF: {PLOF:24004}  PATIENT GOALS: ***  OBJECTIVE:  Note: Objective measures were completed at Evaluation unless otherwise noted.  DIAGNOSTIC FINDINGS: ***  COGNITION: Overall cognitive status: {cognition:24006}   SENSATION: {sensation:27233}  COORDINATION: ***  EDEMA:  {edema:24020}  MUSCLE TONE: {LE tone:25568}  MUSCLE LENGTH: Hamstrings: Right *** deg; Left *** deg Debby test: Right *** deg; Left *** deg  DTRs:  {DTR SITE:24025}  POSTURE: {posture:25561}  LOWER EXTREMITY ROM:     {AROM/PROM:27142}  Right Eval Left Eval  Hip flexion    Hip extension    Hip abduction    Hip adduction    Hip internal rotation    Hip external rotation    Knee flexion    Knee extension    Ankle dorsiflexion    Ankle plantarflexion    Ankle inversion    Ankle eversion     (Blank rows = not tested)  LOWER EXTREMITY MMT:    MMT Right Eval Left Eval  Hip flexion    Hip extension    Hip abduction    Hip adduction    Hip internal rotation    Hip external rotation    Knee flexion    Knee extension    Ankle dorsiflexion    Ankle plantarflexion    Ankle inversion    Ankle eversion    (Blank rows = not tested)  BED MOBILITY:  {bed mobility:32615:p}  TRANSFERS: {transfers eval:32620}  RAMP:  {ramp eval:32616}  CURB:  {curb eval:32617}  STAIRS: {stairs eval:32618} GAIT: Findings: {GaitneuroPT:32644::Distance walked: ***,Comments: ***}  FUNCTIONAL TESTS:  {Functional tests:24029}  PATIENT SURVEYS:  {rehab surveys:24030}                                                                                                                              TREATMENT DATE: ***    PATIENT EDUCATION: Education details: *** Person educated: {Person educated:25204} Education method: {Education Method:25205} Education comprehension: {Education Comprehension:25206}  HOME EXERCISE PROGRAM: ***  GOALS: Goals reviewed with patient?  {yes/no:20286}  SHORT TERM GOALS: Target date: ***  *** Baseline: Goal status: INITIAL  2.  *** Baseline:  Goal status: INITIAL  3.  *** Baseline:  Goal status: INITIAL  4.  *** Baseline:  Goal status: INITIAL  5.  *** Baseline:  Goal status: INITIAL  6.  *** Baseline:  Goal status: INITIAL  LONG TERM GOALS: Target date: ***  *** Baseline:  Goal status: INITIAL  2.  *** Baseline:  Goal status: INITIAL  3.  *** Baseline:  Goal status: INITIAL  4.  *** Baseline:  Goal status: INITIAL  5.  *** Baseline:  Goal status: INITIAL  6.  *** Baseline:  Goal status: INITIAL  ASSESSMENT:  CLINICAL IMPRESSION: Patient is a *** y.o. *** who was seen today for physical therapy evaluation and treatment for ***.   OBJECTIVE IMPAIRMENTS: {opptimpairments:25111}.   ACTIVITY LIMITATIONS: {activitylimitations:27494}  PARTICIPATION LIMITATIONS: {participationrestrictions:25113}  PERSONAL FACTORS: {Personal factors:25162} are also affecting patient's functional outcome.   REHAB POTENTIAL: {rehabpotential:25112}  CLINICAL DECISION MAKING: {clinical decision making:25114}  EVALUATION COMPLEXITY: {Evaluation complexity:25115}  PLAN:  PT FREQUENCY: {rehab frequency:25116}  PT DURATION: {rehab duration:25117}  PLANNED INTERVENTIONS: {rehab planned interventions:25118::97110-Therapeutic exercises,97530- Therapeutic [phone]- Neuromuscular re-education,97535- Self Rjmz,02859- Manual therapy}  PLAN FOR NEXT SESSION: ***   Fonda Simpers, PT, DPT Physical Therapist - [place]  [facility]  10/12/23, 7:38 AM

## 2023-10-13 NOTE — Telephone Encounter (Signed)
 I have not seen these forms. Patient has o.v. 8-27. Will discuss at that time

## 2023-10-13 NOTE — Telephone Encounter (Signed)
 Please advise not sure where the form is at this time

## 2023-10-13 NOTE — Progress Notes (Signed)
 Established patient visit   Patient: Hannah Kirby   DOB: 1945-07-02   78 y.o. Female  MRN: [id number] Visit Date: 10/18/2023  Today's healthcare provider: [name] Perry, MD   Chief Complaint  Patient presents with   Medical Management of Chronic Issues    Patient is here for a 6 month follow up, also needs to discuss a few things with the provider.  Dad needs refill of Hydrocodone .  Patient has been having a runny nose for a while, she can't enjoy eating dinner any where.  Tried several different types of allergies, reports only happens when she is eating.   Subjective    Discussed the use of AI scribe software for clinical note transcription with the patient, who gave verbal consent to proceed.  History of Present Illness   Hannah Kirby is a 78 year old female who presents for a checkup and follow-up on her medications.  She experiences significant nasal drip, which has worsened since her last visit. It is particularly bothersome when driving. She has tried various allergy medications, including generic versions of Claritin and chlorpheniramine, without relief. Afrin, a decongestant, is also used.  She is currently taking felodipine , supplied through the [facility], with a concern about the ten-day delay in receiving the medication. She is seeking a 90-day supply to avoid running out. Additionally, her nitroglycerin  is outdated, with the last prescription dating back to 2016 or earlier, and she is seeking a new, non-expired version.  Her daughter mentions that she has been receiving home health services, where her blood pressure was regularly monitored. She had a CVA earlier this year with residual effects of expressive aphasia and impaired balance and mobility. Due to dissatisfaction with in-home services and availability of transportation, she has switched to outpatient therapy at the [facility], where she receives physical therapy and speech therapy but needs referral for occupational  therapy. Her blood pressure is checked at home if she appears unwell, but there has been no consistent monitoring since the switch.   She also continues to have significant expressive aphasia and is in process of ordering speech generating device and large print key board from [employer]. She requires orders to be faxed with supporting medical records.       Medications: Outpatient Medications Prior to Visit  Medication Sig   acetaminophen  (TYLENOL ) 500 MG tablet Take 500 mg by mouth every 6 (six) hours as needed.   albuterol  (VENTOLIN  HFA) 108 (90 Base) MCG/ACT inhaler Inhale 2 puffs into the lungs every 4 (four) hours as needed for wheezing or shortness of breath.   amoxicillin -clavulanate (AUGMENTIN ) 875-125 MG tablet Take 1 tablet by mouth every 12 (twelve) hours.   aspirin  (ASPIR-81) 81 MG EC tablet Take 81 mg by mouth daily.   benzonatate  (TESSALON ) 100 MG capsule Take 1 capsule (100 mg total) by mouth every 8 (eight) hours.   calcium  carbonate (TUMS - DOSED IN MG ELEMENTAL CALCIUM ) 500 MG chewable tablet Chew 1 tablet by mouth daily as needed.   clopidogrel  (PLAVIX ) 75 MG tablet Take 1 tablet (75 mg total) by mouth daily.   cyanocobalamin  1000 MCG tablet Take 1,000 mcg by mouth daily.   dapagliflozin  propanediol (FARXIGA ) 5 MG TABS tablet Take 1 tablet (5 mg total) by mouth daily before breakfast. (Patient taking differently: Take 2.5 mg by mouth every morning.)   Docusate Sodium  (DSS) 100 MG CAPS Take 1 capsule by mouth daily.   ezetimibe  (ZETIA ) 10 MG tablet TAKE ONE TABLET BY  MOUTH EVERY DAY   Finerenone  (KERENDIA ) 10 MG TABS Take 10 mg by mouth.   Fluticasone -Umeclidin-Vilant (TRELEGY ELLIPTA ) 100-62.5-25 MCG/ACT AEPB INHALE 1 PUFF BY MOUTH EVERY DAY - DISCARD DEVICE 6 WEEKS AFTER IT IS REMOVED FROM THE FOIL TRAY OR WHEN THE DOSE COUNTER READS &quot;0&quot; (WHICHEVER COMES FIRST)   HYDROcodone -acetaminophen  (NORCO) 7.5-325 MG tablet Take 1 tablet by mouth every 6 (six) hours as  needed for moderate pain (pain score 4-6).   insulin  glargine (LANTUS ) 100 UNIT/ML injection Inject 22 Units into the skin daily.   lisinopril  (ZESTRIL ) 20 MG tablet Take 1 tablet (20 mg total) by mouth daily.   meclizine  (ANTIVERT ) 25 MG tablet Take 25 mg by mouth 3 (three) times daily as needed.   metoprolol  tartrate (LOPRESSOR ) 100 MG tablet Take 1 tablet (100 mg total) by mouth 2 (two) times daily.   Multiple Vitamins-Minerals (DAILY MULTI) TABS Take 1 tablet by mouth daily.   Omega-3 Fatty Acids (FISH OIL ) 1000 MG CAPS Take 2 capsules by mouth in the morning and at bedtime.    ondansetron  (ZOFRAN  ODT) 4 MG disintegrating tablet Take 1 tablet (4 mg total) by mouth every 8 (eight) hours as needed for nausea or vomiting.   promethazine -dextromethorphan  (PROMETHAZINE -DM) 6.25-15 MG/5ML syrup Take 2.5 mLs by mouth at bedtime as needed.   RABEprazole  (ACIPHEX ) 20 MG tablet Take 1 tablet (20 mg total) by mouth daily. 15 Mins. before evening meal   rosuvastatin  (CRESTOR ) 40 MG tablet Take 1 tablet (40 mg total) by mouth daily.   Semaglutide, 1 MG/DOSE, (OZEMPIC, 1 MG/DOSE,) 4 MG/3ML SOPN Inject 1 mg into the skin once a week.   sertraline  (ZOLOFT ) 25 MG tablet Take 25 mg by mouth daily.   sucralfate  (CARAFATE ) 1 g tablet Take 1 tablet (1 g total) by mouth 2 (two) times daily. 15 Minutes before evening meal and at bed time   felodipine  (PLENDIL ) 5 MG 24 hr tablet TAKE 1 TABLET BY MOUTH DAILY   nitroGLYCERIN  (NITROSTAT ) 0.4 MG SL tablet Place 1 tablet (0.4 mg total) under the tongue every 5 (five) minutes as needed.   No facility-administered medications prior to visit.   Review of Systems  Constitutional:  Negative for appetite change, chills, fatigue and fever.  Respiratory:  Negative for chest tightness and shortness of breath.   Cardiovascular:  Negative for chest pain and palpitations.  Gastrointestinal:  Negative for abdominal pain, nausea and vomiting.  Neurological:  Negative for dizziness  and weakness.       Objective    BP (!) 140/85 (BP Location: Left Arm, Patient Position: Sitting, Cuff Size: Normal)   Pulse 78   Temp 98.4 F (36.9 C) (Oral)   Ht 5' 5 (1.651 m)   Wt 158 lb 9.6 oz (71.9 kg)   SpO2 95%   BMI 26.39 kg/m   Physical Exam    General: Appearance:    Well developed, well nourished female in no acute distress  Eyes:    PERRL, conjunctiva/corneas clear, EOM's intact       Lungs:     Clear to auscultation bilaterally, respirations unlabored  Heart:    Normal heart rate. Normal rhythm. No murmurs, rubs, or gallops.    MS:   All extremities are intact.    Neurologic:   Awake, alert, oriented x 3. Delayed speed with difficulty work finding. Mildly ataxic gait. Diminished strength LUE        Assessment & Plan     1. Dysarthria as late effect  of cerebellar cerebrovascular accident (CVA) (Primary) Order for speech generating device and large print key board from [place] reviewed, signed and sent to medical records to be faxed.  She continues to have significant difficulty communicating and requires speech assistance device.  Order for speech generating device and large print key board from [place] reviewed, signed and sent to medical records to be faxed.   2. Chronic diastolic CHF (congestive heart failure) (HCC) Well compensated at this time. Continue current medications.  Continue regular follow up cardiology  3. Vasomotor rhinitis Try  ipratropium (ATROVENT ) 0.06 % nasal spray; Place 2 sprays into both nostrils 4 (four) times daily.  Dispense: 15 mL; Refill: 2  4. Primary hypertension BP near goal. Continue current medications.  refill felodipine  (PLENDIL ) 5 MG 24 hr tablet; Take 1 tablet (5 mg total) by mouth daily.  Dispense: 90 tablet; Refill: 3  5. Type 2 diabetes mellitus with stage 3b chronic kidney disease, without long-term current use of insulin  (HCC) Lab Results  Component Value Date   HGBA1C 7.7 (H) 08/06/2023   Continue  current medications.    6. Stage 3b chronic kidney disease (HCC) Stable Continue current medications.    7. Ataxia  - Ambulatory referral to Occupational Therapy  8. Weakness of left upper extremity  - Ambulatory referral to Occupational Therapy  9. Coronary artery disease without angina pectoris, unspecified vessel or lesion type, unspecified whether native or transplanted heart Asymptomatic. Compliant with medication.  Continue aggressive risk factor modification.  Refill nitroGLYCERIN  (NITROSTAT ) 0.4 MG SL tablet; Place 1 tablet (0.4 mg total) under the tongue every 5 (five) minutes as needed.  Dispense: 25 tablet; Refill: 3     Order for speech generating device and large print key board from [employer] reviewed, signed and sent to medical records to be faxed.     [name] Perry, MD  [facility] [phone] (phone) [phone] (fax)  [facility]

## 2023-10-17 ENCOUNTER — Ambulatory Visit: Attending: Family Medicine

## 2023-10-17 ENCOUNTER — Ambulatory Visit

## 2023-10-17 DIAGNOSIS — R2681 Unsteadiness on feet: Secondary | ICD-10-CM | POA: Insufficient documentation

## 2023-10-17 DIAGNOSIS — R262 Difficulty in walking, not elsewhere classified: Secondary | ICD-10-CM | POA: Diagnosis not present

## 2023-10-17 DIAGNOSIS — R482 Apraxia: Secondary | ICD-10-CM | POA: Insufficient documentation

## 2023-10-17 DIAGNOSIS — R41841 Cognitive communication deficit: Secondary | ICD-10-CM | POA: Insufficient documentation

## 2023-10-17 DIAGNOSIS — M6281 Muscle weakness (generalized): Secondary | ICD-10-CM | POA: Insufficient documentation

## 2023-10-17 DIAGNOSIS — R2689 Other abnormalities of gait and mobility: Secondary | ICD-10-CM | POA: Insufficient documentation

## 2023-10-17 DIAGNOSIS — R4701 Aphasia: Secondary | ICD-10-CM

## 2023-10-17 DIAGNOSIS — R269 Unspecified abnormalities of gait and mobility: Secondary | ICD-10-CM | POA: Insufficient documentation

## 2023-10-17 NOTE — Therapy (Signed)
 OUTPATIENT PHYSICAL THERAPY NEURO EVALUATION   Patient Name: Hannah Kirby MRN: [id number] DOB:[birth year], 78 y.o., female Today's Date: 10/18/2023   PCP: Gasper [name] BRAVO, MD REFERRING PROVIDER: Gasper [name] BRAVO, MD  END OF SESSION:  PT End of Session - 10/17/23 1317     Visit Number 1    Number of Visits 25    Date for PT Re-Evaluation 01/09/24    PT Start Time 1317    PT Stop Time 1400    PT Time Calculation (min) 43 min          Past Medical History:  Diagnosis Date   Acute hemorrhagic colitis 08/08/2019   Anemia    Anxiety    Arthritis    C. difficile colitis 08/09/2019   CAD (coronary artery disease)    a. 02/2006 PCI: BMS x 2 to RCA, cath o/w without significant coronary disease; b. nuclear stress test 07/2014: No ischemia/infarct; c. 11/2017 MV: no isch/infarct, EF 55-65%; d. 03/2020 NSTEMI/PCI: LM nl, LAD min irregs, RI 25, small, LCX nl, RCA 30p/m ISR, 99d (3.0x15 Resolute Onyx DES).   Cataract 02/04/2018   Cerebrovascular accident (CVA) (HCC) 03/18/2023   dysarthria   Chronic bronchitis (HCC)    secondary to cigarette smoking   Chronic kidney disease (CKD), stage III (moderate) (HCC)    COPD (chronic obstructive pulmonary disease) (HCC)    Diabetes mellitus    Diastolic dysfunction    a. 07/2019 Echo: EF 60-65%, no rwma, mod LVH. Nl RV size/fxn; b. 03/2020 Echo: EF 60-65%, mod LVH, gr1 DD, nl RV size/fxn, mild AS.   FHx: allergies    GERD (gastroesophageal reflux disease)    Goiter    Granulomatous disease (HCC)    Hernia    Hyperlipidemia    Hypertension    Kidney stone on left side 2013   Microalbuminuria    NSTEMI (non-ST elevated myocardial infarction) (HCC) 03/23/2020   Obesity    Oxygen  deficiency    Panic attacks    PVC's (premature ventricular contractions)    a. 03/2018 Zio: Occas PVCs (2.5%). Triggered events assoc w/ PVC/PAC.   Retinal tear 2020   Smokers' cough (HCC)    Spinal stenosis    Stroke (HCC) 10/29/2016   mild left side  weakness   Stroke (HCC) 08/01/2019   Tobacco abuse    Past Surgical History:  Procedure Laterality Date   ABDOMINAL HYSTERECTOMY     BREAST SURGERY     CATARACT EXTRACTION W/PHACO Right 03/01/2017   Procedure: CATARACT EXTRACTION PHACO AND INTRAOCULAR LENS PLACEMENT (IOC) RIGHT DIABETIC;  Surgeon: Mittie Gaskin, MD;  Location: [facility];  Service: Ophthalmology;  Laterality: Right;   CATARACT EXTRACTION W/PHACO Left 03/22/2017   Procedure: CATARACT EXTRACTION PHACO AND INTRAOCULAR LENS PLACEMENT (IOC) LEFT DIABETIC;  Surgeon: Mittie Gaskin, MD;  Location: [facility];  Service: Ophthalmology;  Laterality: Left;  Diabetic - insulin  and oral meds   COLONOSCOPY WITH PROPOFOL  N/A 09/23/2014   Procedure: COLONOSCOPY WITH PROPOFOL ;  Surgeon: Gladis RAYMOND Mariner, MD;  Location: [facility] ENDOSCOPY;  Service: Endoscopy;  Laterality: N/A;   COLONOSCOPY WITH PROPOFOL  N/A 01/11/2018   Procedure: COLONOSCOPY WITH PROPOFOL ;  Surgeon: Mariner Gladis RAYMOND, MD;  Location: [facility] ENDOSCOPY;  Service: Endoscopy;  Laterality: N/A;   COLONOSCOPY WITH PROPOFOL  N/A 04/24/2018   Procedure: COLONOSCOPY WITH PROPOFOL ;  Surgeon: Mariner Gladis RAYMOND, MD;  Location: [facility] ENDOSCOPY;  Service: Endoscopy;  Laterality: N/A;   COLONOSCOPY WITH PROPOFOL  N/A 11/19/2019   Procedure: COLONOSCOPY WITH PROPOFOL ;  Surgeon: Jinny Carmine, MD;  Location: [facility] ENDOSCOPY;  Service: Endoscopy;  Laterality: N/A;   CORONARY ANGIOPLASTY WITH STENT PLACEMENT  2008   CORONARY STENT INTERVENTION N/A 03/24/2020   Procedure: CORONARY STENT INTERVENTION;  Surgeon: Mady Bruckner, MD;  Location: [facility] INVASIVE CV LAB;  Service: Cardiovascular;  Laterality: N/A;   ESOPHAGOGASTRODUODENOSCOPY (EGD) WITH PROPOFOL  N/A 12/30/2014   Procedure: ESOPHAGOGASTRODUODENOSCOPY (EGD) WITH PROPOFOL ;  Surgeon: Gladis RAYMOND Mariner, MD;  Location: [facility] ENDOSCOPY;  Service: Endoscopy;  Laterality: N/A;   ESOPHAGOGASTRODUODENOSCOPY (EGD) WITH  PROPOFOL  N/A 07/19/2016   Procedure: ESOPHAGOGASTRODUODENOSCOPY (EGD) WITH PROPOFOL ;  Surgeon: Mariner Gladis RAYMOND, MD;  Location: [facility] ENDOSCOPY;  Service: Endoscopy;  Laterality: N/A;   ESOPHAGOGASTRODUODENOSCOPY (EGD) WITH PROPOFOL  N/A 04/24/2018   Procedure: ESOPHAGOGASTRODUODENOSCOPY (EGD) WITH PROPOFOL ;  Surgeon: Mariner Gladis RAYMOND, MD;  Location: [facility] END[facility]COPY;  Service: Endoscopy;  Laterality: N/A;   EYE SURGERY  cataracts, detached retina   hysterectomy (other)     LEFT HEART CATH AND CORONARY ANGIOGRAPHY N/A 03/24/2020   Procedure: LEFT HEART CATH AND CORONARY ANGIOGRAPHY;  Surgeon: Mady Bruckner, MD;  Location: [facility] INV[facility] CV LAB;  Service: Cardiovascular;  Laterality: N/A;   Patient Active Problem List   Diagnosis Date Noted   Stroke (HCC) 08/05/2023   Chronic diastolic CHF (congestive heart failure) (HCC) 08/05/2023   Depression with anxiety 08/05/2023   Chronic kidney disease, stage 3b (HCC) 08/05/2023   Tobacco abuse 08/05/2023   Overweight (BMI 25.0-29.9) 08/05/2023   HLD (hyperlipidemia) 08/05/2023   CAD (coronary artery disease) 08/05/2023   HTN (hypertension) 08/05/2023   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) 03/27/2023   Dysarthria 03/17/2023   Chronic obstructive pulmonary disease (HCC) 03/17/2023   Dizziness 02/23/2023   Dehydration 02/20/2023   Restrictive airway disease 07/21/2020   Partial thickness rotator cuff tear 03/20/2020   Shoulder pain 03/05/2020   Retinal detachment, left 03/04/2020   Ataxia 02/28/2020   Difficulty walking 02/28/2020   Physical deconditioning 02/28/2020   Chronic diastolic congestive heart failure (HCC) 11/20/2019   Polyp of ascending colon    Hypokalemia 08/08/2019   TIA (transient ischemic attack) 08/01/2019   Benign hypertensive kidney disease with chronic kidney disease 04/25/2019   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   Secondary  hyperparathyroidism of renal origin (HCC) 10/24/2018   Chronic, continuous use of opioids 03/26/2018   History of adenomatous polyp of colon 01/22/2018   Ulceration of colon determined by endoscopy 01/22/2018   Diverticulosis of colon 01/22/2018   History of CVA (cerebrovascular accident) 11/08/2016   Weakness of left upper extremity 10/30/2016   AAA (abdominal aortic aneurysm) without rupture (HCC) 08/12/2016   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   Stage 3b chronic kidney disease (HCC) 07/27/2015   Chest pain at rest 05/06/2015   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HCC) 10/28/2014   Solitary pulmonary nodule on lung CT 08/20/2014   Allergic rhinitis 07/28/2014   CAFL (chronic airflow limitation) (HCC) 07/28/2014   Acid reflux 07/28/2014   Granuloma annulare    Back pain 11/12/2013   Lumbar scoliosis 10/30/2013   Lumbar radiculopathy 10/30/2013   Lumbar canal stenosis 10/30/2013   Calcium  blood increased 10/22/2013   Proteinuria 10/22/2013   Obesity 03/21/2011   Smokes with greater than 30 pack year history 03/21/2011   Edema 08/18/2010   Hyperlipidemia 03/25/2009   Coronary artery disease 03/25/2009   Primary hypertension 03/25/2009    ONSET DATE: 08/05/23  REFERRING DIAG:  Z86.73 (ICD-10-CM) - History of CVA (cerebrovascular  accident)  R27.0 (ICD-10-CM) - Ataxia    THERAPY DIAG:  Abnormality of gait and mobility - Plan: PT plan of care cert/re-cert  Difficulty in walking, not elsewhere classified - Plan: PT plan of care cert/re-cert  Muscle weakness (generalized) - Plan: PT plan of care cert/re-cert  Other abnormalities of gait and mobility - Plan: PT plan of care cert/re-cert  Unsteadiness on feet - Plan: PT plan of care cert/re-cert  Rationale for Evaluation and Treatment: Rehabilitation  SUBJECTIVE:                                                                                                                                                                                              SUBJECTIVE STATEMENT:  Pt is well known to the clinic, however has had another stroke since the last visit.  Pt was getting home health, however the pt's daughter reports that they were not getting her up and moving as much as needed, so they elected to return here.    Pt accompanied by: self and family member, daughter  PERTINENT HISTORY:  PMH: of CVA, COPD, Dysarthria, Ataxia, TIA, T2DM, Back pain, scoliosis   PAIN:  Are you having pain? Yes: NPRS scale: 3/10 Pain location: low back and L hip Pain description: achy Aggravating factors: just hurts all the time. Relieving factors: hydrocodone  at night  PRECAUTIONS: Other: Pt has a looper to see if pt has a-fib  RED FLAGS: Abdominal aneurysm: Yes: pt reports having an abdominal aneurism as found by Dr. Marea.   WEIGHT BEARING RESTRICTIONS: No  FALLS: Has patient fallen in last 6 months? Yes. Number of falls 1.  Pt reports losing balance, and got caught up in cords that were on the floor when she was trying to close the blinds.  LIVING ENVIRONMENT: Lives with: lives with their spouse Lives in: House/apartment Stairs: Yes: External: 1 steps; none Has following equipment at home: Single point cane, Walker - 2 wheeled, Environmental consultant - 4 wheeled, shower chair, and Grab bars  PLOF: Independent  PATIENT GOALS: Pt is wanting to gain independence.  Pt is wanting to improve endurance levels and improving speech with SLP.  OBJECTIVE:  Note: Objective measures were completed at Evaluation unless otherwise noted.  DIAGNOSTIC FINDINGS:   EXAM: MRI HEAD WITHOUT CONTRAST   MRA HEAD WITHOUT CONTRAST   MRA NECK WITHOUT AND WITH CONTRAST   IMPRESSION: 1. Small acute infarcts in the left frontal lobe, right parietal lobe, and left cerebellum. 2. Extensive chronic small vessel ischemic disease. 3. 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 4. Unchanged severe right A3 stenosis or segmental occlusion.  5. Moderate  left P2 stenosis. 6. Limited assessment of the left vertebral artery origin, otherwise negative neck MRA.   COGNITION: Overall cognitive status: Within functional limits for tasks assessed   SENSATION: WFL, however pt notes having some numbness in the R hand.  COORDINATION: WFL  POSTURE: rounded shoulders, forward head, decreased lumbar lordosis, increased thoracic kyphosis, posterior pelvic tilt, and flexed trunk    LOWER EXTREMITY MMT:    MMT Right Eval Left Eval  Hip flexion 4 4  Hip extension    Hip abduction 4 4-  Hip adduction 4 4-  Hip internal rotation    Hip external rotation    Knee flexion 4 4-  Knee extension 4 4-  Ankle dorsiflexion 4 4-  Ankle plantarflexion    Ankle inversion    Ankle eversion    (Blank rows = not tested)  BED MOBILITY:  Not tested  FUNCTIONAL TESTS:  5 times sit to stand: 24.36 sec Timed up and go (TUG): 23.94 sec with walker 2 minute walk test: TBD 10 meter walk test: 20.33 sec with walker Berg Balance Scale: TBD  PATIENT SURVEYS:   Stroke Impact Scale 16 (Copyrighted instrument, [facility])  In the past 2 weeks, how difficult was it to...  Rating Scale 5 = Not difficult at all 4 = A little difficult 3 = Somewhat difficult 2 = Very difficult 1 = Could not do at all  a. Dress the top part of your body? 4  b. Bathe yourself? 2  c. Get to the toilet on time? 5  d. Control your bladder (not have an accident)? 5  e. Control your bowels (not have an accident)? 5  f. Stand without losing balance? 3  g. Go shopping? 2  h. Do heavy household chores (e.g. vacuum, laundry or  yard work)? 1  i. Stay sitting without losing your balance? 3  j. Walk without losing your balance? 2  k. Move from a bed to a chair? 3  l. Walk fast? 1  m. Climb one flight of stairs? 1  n. Walk one block? 1  o. Get in and out of a car? 2  p. Carry heavy objects (e.g. bag of groceries) with your  affected hand? 1  Sum:   41/80  MDC (Minimal Detectable Change) is >/=8                                                                                                                                 TREATMENT DATE: 10/18/23  Evaluation:  Self-Care/Home Management:  Pt and daughter received education on the prognosis of the therapy following the conditions that the pt currently presents with.  Pt and daughter received education on the role of PT and the necessary buy in from pt to perform tasks at home in between sessions of therapy to achieve maximal potential.  Pt understanding of this and agreeable to performing exercises at  home.  Pt also informed of method of interventions in order to improve overall endurance and strength levels to improve balance necessary for reduced fall risk.     PATIENT EDUCATION: Education details: Pt educated on role of PT and services provided during current POC, along with prognosis and information about the clinic.  Person educated: Patient and Child(ren) Education method: Explanation Education comprehension: verbalized understanding and returned demonstration  HOME EXERCISE PROGRAM:  Access Code: [email or web address] URL: [email or web address] Date: 10/17/2023 Prepared by: Sidra Simpers  Exercises - Standing March with Counter Support  - 1 x daily - 3-4 x weekly - 3 sets - 10 reps - Standing Knee Flexion with Unilateral Counter Support  - 1 x daily - 7 x weekly - 3 sets - 10 reps - Standing Hip Extension with Unilateral Counter Support  - 1 x daily - 7 x weekly - 3 sets - 10 reps - Standing Hip Abduction with Unilateral Counter Support  - 1 x daily - 7 x weekly - 3 sets - 10 reps - Heel Toe Raises with Unilateral Counter Support  - 1 x daily - 7 x weekly - 3 sets - 10 reps  GOALS: Goals reviewed with patient? Yes  SHORT TERM GOALS: Target date: 11/15/2023  Pt will be independent with HEP in order to demonstrate increased ability to perform tasks related to  occupation/hobbies. Baseline: pt given HEP at eval Goal status: INITIAL  LONG TERM GOALS: Target date: 01/10/2024  1.  Patient (> 38 years old) will complete five times sit to stand test in < 15 seconds indicating an increased LE strength and improved balance. Baseline: 24.36 sec Goal status: INITIAL  2.  Patient will improve SIS 16 score to 55   to demonstrate statistically significant improvement in mobility and quality of life as it relates to their CVA.  Baseline: 41 Goal status: INITIAL   3.  Patient will increase Berg Balance score by > 6 points to demonstrate decreased fall risk during functional activities. Baseline: TBD Goal status: INITIAL   4.  Patient will reduce timed up and go to <11 seconds to reduce fall risk and demonstrate improved transfer/gait ability. Baseline: 23.94 sec with walker Goal status: INITIAL  5.  Patient will increase 10 meter walk test to >1.54m/s as to improve gait speed for better community ambulation and to reduce fall risk. Baseline: 20.33 sec with walker Goal status: INITIAL  6.  Patient will increase 2 minute walk test distance by 50 ft or greater for progression to community ambulator and demonstrate improved gait ability  Baseline: TBD Goal status: INITIAL     ASSESSMENT:  CLINICAL IMPRESSION: Patient is a 78 y.o. female who was seen today for physical therapy evaluation and treatment for CVA related symptoms.  Pt is well-known to the clinic, however since the last visit has experienced another stroke and self-reports increased weakness.  The weakness is evident through the testing performed today which signify a higher likelihood of falling.  Pt will benefit from skilled therapy to address tolerance, balance, endurance, and strength impairments necessary for improvement in quality of life.  Pt. demonstrates understanding of this plan of care and agrees with this plan.   OBJECTIVE IMPAIRMENTS: Abnormal gait, decreased activity tolerance,  decreased balance, decreased endurance, decreased knowledge of use of DME, decreased mobility, difficulty walking, decreased strength, impaired sensation, and pain.   ACTIVITY LIMITATIONS: carrying, lifting, bending, standing, squatting, stairs, transfers, bathing, toileting, dressing, hygiene/grooming, and locomotion level  PARTICIPATION LIMITATIONS: meal prep, cleaning,  laundry, community activity, and yard work  PERSONAL FACTORS: Age and 3+ comorbidities:  PMH: of CVA, COPD, Dysarthria, Ataxia, TIA, T2DM, Back pain, scoliosis are also affecting patient's functional outcome.   REHAB POTENTIAL: Good  CLINICAL DECISION MAKING: Evolving/moderate complexity  EVALUATION COMPLEXITY: Moderate  PLAN:  PT FREQUENCY: 2x/week  PT DURATION: 12 weeks  PLANNED INTERVENTIONS: 97750- Physical Performance Testing, 97110-Therapeutic exercises, 97530- Therapeutic activity, 97112- Neuromuscular re-education, 97535- Self Care, [id number]- Manual therapy, [phone]- Gait training, Balance training, and Vestibular training  PLAN FOR NEXT SESSION:  BERG, , monitor SPO2 and get portable oxygen  if necessary  Endurance, balance, strength ( L>R), administer HEP, gait training    Fonda Simpers, PT, DPT Physical Therapist - [facility]  [facility]  10/18/23, 11:31 AM

## 2023-10-17 NOTE — Therapy (Signed)
 OUTPATIENT SPEECH LANGUAGE PATHOLOGY APHASIA EVALUATION   Patient Name: Hannah Kirby MRN: [id number] DOB:1945-07-15, 78 y.o., female Today's Date: 10/17/2023  PCP: [name] Perry, MD REFERRING PROVIDER: [name] Perry, MD   End of Session - 10/17/23 1440     Visit Number 1    Number of Visits 23    Date for SLP Re-Evaluation 01/09/24    Progress Note Due on Visit 10    SLP Start Time 1400    SLP Stop Time  1440    SLP Time Calculation (min) 40 min    Activity Tolerance Patient tolerated treatment well          Past Medical History:  Diagnosis Date   Acute hemorrhagic colitis 08/08/2019   Anemia    Anxiety    Arthritis    C. difficile colitis 08/09/2019   CAD (coronary artery disease)    a. 02/2006 PCI: BMS x 2 to RCA, cath o/w without significant coronary disease; b. nuclear stress test 07/2014: No ischemia/infarct; c. 11/2017 MV: no isch/infarct, EF 55-65%; d. 03/2020 NSTEMI/PCI: LM nl, LAD min irregs, RI 25, small, LCX nl, RCA 30p/m ISR, 99d (3.0x15 Resolute Onyx DES).   Cataract 02/04/2018   Cerebrovascular accident (CVA) (HCC) 03/18/2023   dysarthria   Chronic bronchitis (HCC)    secondary to cigarette smoking   Chronic kidney disease (CKD), stage III (moderate) (HCC)    COPD (chronic obstructive pulmonary disease) (HCC)    Diabetes mellitus    Diastolic dysfunction    a. 07/2019 Echo: EF 60-65%, no rwma, mod LVH. Nl RV size/fxn; b. 03/2020 Echo: EF 60-65%, mod LVH, gr1 DD, nl RV size/fxn, mild AS.   FHx: allergies    GERD (gastroesophageal reflux disease)    Goiter    Granulomatous disease (HCC)    Hernia    Hyperlipidemia    Hypertension    Kidney stone on left side 2013   Microalbuminuria    NSTEMI (non-ST elevated myocardial infarction) (HCC) 03/23/2020   Obesity    Oxygen  deficiency    Panic attacks    PVC's (premature ventricular contractions)    a. 03/2018 Zio: Occas PVCs (2.5%). Triggered events assoc w/ PVC/PAC.   Retinal tear 2020   Smokers' cough  (HCC)    Spinal stenosis    Stroke (HCC) 10/29/2016   mild left side weakness   Stroke (HCC) 08/01/2019   Tobacco abuse    Past Surgical History:  Procedure Laterality Date   ABDOMINAL HYSTERECTOMY     BREAST SURGERY     CATARACT EXTRACTION W/PHACO Right 03/01/2017   Procedure: CATARACT EXTRACTION PHACO AND INTRAOCULAR LENS PLACEMENT (IOC) RIGHT DIABETIC;  Surgeon: Mittie Gaskin, MD;  Location: [facility];  Service: Ophthalmology;  Laterality: Right;   CATARACT EXTRACTION W/PHACO Left 03/22/2017   Procedure: CATARACT EXTRACTION PHACO AND INTRAOCULAR LENS PLACEMENT (IOC) LEFT DIABETIC;  Surgeon: Mittie Gaskin, MD;  Location: [facility];  Service: Ophthalmology;  Laterality: Left;  Diabetic - insulin  and oral meds   COLONOSCOPY WITH PROPOFOL  N/A 09/23/2014   Procedure: COLONOSCOPY WITH PROPOFOL ;  Surgeon: Gladis RAYMOND Mariner, MD;  Location: [facility] ENDOSCOPY;  Service: Endoscopy;  Laterality: N/A;   COLONOSCOPY WITH PROPOFOL  N/A 01/11/2018   Procedure: COLONOSCOPY WITH PROPOFOL ;  Surgeon: Mariner Gladis RAYMOND, MD;  Location: [facility] ENDOSCOPY;  Service: Endoscopy;  Laterality: N/A;   COLONOSCOPY WITH PROPOFOL  N/A 04/24/2018   Procedure: COLONOSCOPY WITH PROPOFOL ;  Surgeon: Mariner Gladis RAYMOND, MD;  Location: [facility] ENDOSCOPY;  Service: Endoscopy;  Laterality: N/A;  COLONOSCOPY WITH PROPOFOL  N/A 11/19/2019   Procedure: COLONOSCOPY WITH PROPOFOL ;  Surgeon: Jinny Carmine, MD;  Location: [facility] ENDOSCOPY;  Service: Endoscopy;  Laterality: N/A;   CORONARY ANGIOPLASTY WITH STENT PLACEMENT  2008   CORONARY STENT INTERVENTION N/A 03/24/2020   Procedure: CORONARY STENT INTERVENTION;  Surgeon: Mady Bruckner, MD;  Location: [facility] INVASIVE CV LAB;  Service: Cardiovascular;  Laterality: N/A;   ESOPHAGOGASTRODUODENOSCOPY (EGD) WITH PROPOFOL  N/A 12/30/2014   Procedure: ESOPHAGOGASTRODUODENOSCOPY (EGD) WITH PROPOFOL ;  Surgeon: Gladis RAYMOND Mariner, MD;  Location: [facility];  Service:  Endoscopy;  Laterality: N/A;   ESOPHAGOGASTRODUODENOSCOPY (EGD) WITH PROPOFOL  N/A 07/19/2016   Procedure: ESOPHAGOGASTRODUODENOSCOPY (EGD) WITH PROPOFOL ;  Surgeon: Mariner Gladis RAYMOND, MD;  Location: [facility];  Service: Endoscopy;  Laterality: N/A;   ESOPHAGOGASTRODUODENOSCOPY (EGD) WITH PROPOFOL  N/A 04/24/2018   Procedure: ESOPHAGOGASTRODUODENOSCOPY (EGD) WITH PROPOFOL ;  Surgeon: Mariner Gladis RAYMOND, MD;  Location: [facility] ENDOSCOPY;  Service: Endoscopy;  Laterality: N/A;   EYE SURGERY  cataracts, detached retina   hysterectomy (other)     LEFT HEART CATH AND CORONARY ANGIOGRAPHY N/A 03/24/2020   Procedure: LEFT HEART CATH AND CORONARY ANGIOGRAPHY;  Surgeon: Mady Bruckner, MD;  Location: [facility] INVASIVE CV LAB;  Service: Cardiovascular;  Laterality: N/A;   Patient Active Problem List   Diagnosis Date Noted   Stroke (HCC) 08/05/2023   Chronic diastolic CHF (congestive heart failure) (HCC) 08/05/2023   Depression with anxiety 08/05/2023   Chronic kidney disease, stage 3b (HCC) 08/05/2023   Tobacco abuse 08/05/2023   Overweight (BMI 25.0-29.9) 08/05/2023   HLD (hyperlipidemia) 08/05/2023   CAD (coronary artery disease) 08/05/2023   HTN (hypertension) 08/05/2023   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) 03/27/2023   Dysarthria 03/17/2023   Chronic obstructive pulmonary disease (HCC) 03/17/2023   Dizziness 02/23/2023   Dehydration 02/20/2023   Restrictive airway disease 07/21/2020   Partial thickness rotator cuff tear 03/20/2020   Shoulder pain 03/05/2020   Retinal detachment, left 03/04/2020   Ataxia 02/28/2020   Difficulty walking 02/28/2020   Physical deconditioning 02/28/2020   Chronic diastolic congestive heart failure (HCC) 11/20/2019   Polyp of ascending colon    Hypokalemia 08/08/2019   TIA (transient ischemic attack) 08/01/2019   Benign hypertensive kidney disease with chronic kidney disease 04/25/2019   Type 2 diabetes mellitus with  diabetic chronic kidney disease (HCC) 04/25/2019   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   Chronic, continuous use of opioids 03/26/2018   History of adenomatous polyp of colon 01/22/2018   Ulceration of colon determined by endoscopy 01/22/2018   Diverticulosis of colon 01/22/2018   History of CVA (cerebrovascular accident) 11/08/2016   Weakness of left upper extremity 10/30/2016   AAA (abdominal aortic aneurysm) without rupture (HCC) 08/12/2016   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   Stage 3b chronic kidney disease (HCC) 07/27/2015   Chest pain at rest 05/06/2015   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HCC) 10/28/2014   Solitary pulmonary nodule on lung CT 08/20/2014   Allergic rhinitis 07/28/2014   CAFL (chronic airflow limitation) (HCC) 07/28/2014   Acid reflux 07/28/2014   Granuloma annulare    Back pain 11/12/2013   Lumbar scoliosis 10/30/2013   Lumbar radiculopathy 10/30/2013   Lumbar canal stenosis 10/30/2013   Calcium  blood increased 10/22/2013   Proteinuria 10/22/2013   Obesity 03/21/2011   Smokes with greater than 30 pack year history 03/21/2011   Edema 08/18/2010   Hyperlipidemia 03/25/2009   Coronary artery disease 03/25/2009   Primary hypertension 03/25/2009    ONSET DATE:  03/17/23 (admitted with stroke), 05/23/23 (referral date)  REFERRING DIAG:  R41.841 (ICD-10-CM) - Cognitive communication deficit  I63.9 (ICD-10-CM) - CVA (cerebral vascular accident) (HCC)  R47.02 (ICD-10-CM) - Dysphasia    THERAPY DIAG:  Aphasia  Apraxia of speech  Cognitive communication deficit  Rationale for Evaluation and Treatment Rehabilitation  SUBJECTIVE:   SUBJECTIVE STATEMENT: Pt alert, pleasant, and cooperative.  Pt accompanied by: self and family member  PERTINENT HISTORY:  Pt is a 78 y.o. female who presents for communication evaluation in setting on stroke. Pt in ED 1/24-1/25/25. Pt d/c'd home with HH ST. PMHx significant of   HTN, CKD stage  IIIB, DMT2 on insulin , CAD, chronic smoking, chronic respiratory failure with COPD and on home oxygen , anxiety, HLD. MRI 03/17/23 1. Punctate foci of restricted diffusion in the left posterior  frontal white matter, most likely acute infarcts.  2. Numerous foci of hemosiderin deposition, which are seen in the  deep gray structures but also in the bilateral cerebral hemispheres,  with 1 new focus compared to 02/20/2023. While this could be the  sequela of chronic hypertensive microhemorrhages, this appearance is  concerning for cerebral amyloid angiopathy.   PAIN:  Are you having pain? No  FALLS: defer to PT  LIVING ENVIRONMENT: Lives with: lives with their spouse Lives in: House/apartment  PLOF:  Level of assistance: Independent with ADLs Employment: Retired   PATIENT GOALS    for communication to improve   OBJECTIVE:  COGNITION: Overall cognitive status: Difficulty to assess due to: Communication impairment   AUDITORY COMPREHENSION: Overall auditory comprehension: Impaired: moderately complex and complex YES/NO questions: Appears intact Following directions: Impaired: moderately complex and complex Conversation: Complex Interfering components: processing speed Effective technique: extra processing time, repetition/stressing words, and visual/gestural cues   READING COMPREHENSION: TBA  EXPRESSION: verbal  VERBAL EXPRESSION: Overall verbal expression: Impaired: moderately complex and complex Level of generative/spontaneous verbalization: word and phrase Automatic speech: name: intact and social response: intact  Repetition: Impaired: phrase and sentence Naming: Confrontation: >75% accuracy and Divergent: reduced Pragmatics: Impaired: topic maintenance Comments: due to aphasia Effective technique: semantic cues, sentence completion, and phonemic cues  WRITTEN EXPRESSION: TBA  MOTOR SPEECH: Overall motor speech: Appears intact Intelligibility:  Intelligible Motor planning: Impaired: aware and inconsistent    ORAL MOTOR EXAMINATION No facial droop appreciated  STANDARDIZED ASSESSMENTS:   Western Aphasia Battery- Revised  Spontaneous Speech                           Information content               8/10                                            Fluency                                 5/10                                          Comprehension     Yes/No questions                 54/60  Auditory Word Recognition  60/60                                Sequential Commands       60/80                              Repetition                             66/100                                        Naming    Object Naming                     52/60                                           Word Fluency                        9/20                                            Sentence Completion          10/10                                             Responsive Speech              10/10                                         Aphasia Quotient                  73/100         Pt's severity rating was moderate as indicated by an Aphasia Quotient of 73 (0-25=very severe, 26-50=severe, 51-75=moderate, 76 and above is mild). Pt's presentation is most consistent with Broca's subtype, characterized by impaired verbal expression, impaired auditory comprehension, and impaired repetition. Pt's verbal expression is characterized by often telegraphic by more fluent speech with some grammatical organization, marked wordfinding difficulty, phonemic/semantic paraphasias. Co-existing apraxia of speech noted given groping, difficulty sequencing on repetition tasks.    PATIENT REPORTED OUTCOME MEASURES (PROM):  The Communication Effectiveness Survey is a patient-reported outcome measure in which the patient rates their own effectiveness in different communication situations. A higher score indicates greater  effectiveness.   Pt's self-rating was  12/32.   Having a conversation with a family member or friends at home. 2 Participating in conversation with strangers in a quiet place. 2 Conversing with a familiar person over the telephone. 2 Conversing with a stranger over the telephone. 1- Not at all effective Being part of a conversation in a noisy environment (social gathering). 1- Not at all effective  Speaking to a friend when you are emotionally upset or you are angry. 1- Not at all effective Having a conversation while traveling in a car. 1- Not at all effective Having a conversation with someone at a distance (across a room). 2   TODAY'S TREATMENT:  Pt and family educated at length re: role of SLP, results of assessment, domains of language, changes to language and cognitive-communication following stroke, ST POC, and positive prognostic indicators for communication as well as improvement from previous evaluation.    PATIENT EDUCATION: Education details: as above Person educated: Patient and Child Education method: Explanation Education comprehension: verbalized understanding and needs further education  HOME EXERCISE PROGRAM:   To be given in upcoming sessions as appropriate    GOALS:  Goals reviewed with patient? Yes  SHORT TERM GOALS: Target date: 10 sessions  Pt will participate in further assessment of functional reading/writing. Baseline: Goal status: INITIAL  2.  With Moderate A, patient will complete a semantic feature analysis with at least 2 relevant features for 8/10 target words to improve word-finding skills.  Baseline:  Goal status: INITIAL  3.  With Maximal A, patient will generate sentences with 3 or more words in response to a situation at 80% accuracy in order to increase ability to communicate basic wants and needs.  Baseline:  Goal status: INITIAL  4.   With Maximal A,  pt will follow 2-step commands for improved participation in ADLs/iADLs with max  cueing. Baseline:  Goal status: INITIAL  5.  Pt will repeat short sentences (<4 words) with good approximations 80% of the time with max cueing. Baseline:  Goal status: INITIAL    LONG TERM GOALS: Target date: 12 weeks  Pt will report a subjective improvement in communication per PROM.  Baseline: CES 12/32 on 8/26 Goal status: INITIAL  2.  With Min A, patient/family will demonstrate understanding of the following concepts: aphasia, spontaneous recovery, communication vs conversation, strengths/strategies to promote success, local resources in order to increase patient's participation in medical care.    Baseline:  Goal status: INITIAL   ASSESSMENT:  CLINICAL IMPRESSION: Pt is a 78 y.o. female who presents for communication evaluation in setting on stroke. Pt known to SLP services and was receiving OP ST prior to most recent stroke 08/05/23. Pt d/c'd home with [facility] ST who is working on securing SGD for pt. PMHx significant of  HTN, CKD stage IIIB, DMT2 on insulin , CAD, chronic smoking, chronic respiratory failure with COPD and on home oxygen , anxiety, HLD. Assessment completed via functional/dynamic means including Western Aphasia Battery Revised (WAB-R) and PROM (Communication Effectiveness Scale). 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 Pt with halting, telegraphic speech, paraphasias, neologisms, moderate wordfinding difficulty. Similar difficulty noted with confrontation naming and divergent naming. Auditory comprehension is a relatively strength for pt as pt with intact single word recognition and ability to answer simple and complex yes/no questions. Increased difficulty noted with pt's ability to follow 2-step (moderately complex and complex) commands. Pt with impaired repetition at phrase and sentence level. 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 Recommend course of ST targeting functional communication, further assessment of  functional reading/writing, and pt/caregiver training to help promote QoL and overall life participation.   OBJECTIVE IMPAIRMENTS include expressive language, receptive language, and aphasia. These impairments are limiting patient from ADLs/IADLs and effectively communicating at home and in community. Factors affecting potential to achieve goals and functional outcome are severity of impairments. Patient will benefit from skilled  SLP services to address above impairments and improve overall function.  REHAB POTENTIAL: Good  PLAN: SLP FREQUENCY: 2x/week  SLP DURATION: 12 weeks  PLANNED INTERVENTIONS: Language facilitation, Cueing hierachy, Internal/external aids, Functional tasks, Multimodal communication approach, SLP instruction and feedback, Compensatory strategies, and Patient/family education    Delon Bangs, M.S., CCC-SLP Speech-Language Pathologist [place] [facility] [phone] [place])  [facility] at [facility] [address] [place], [place], [address] Phone: [phone]   Fax:  [phone]

## 2023-10-18 ENCOUNTER — Ambulatory Visit (INDEPENDENT_AMBULATORY_CARE_PROVIDER_SITE_OTHER): Admitting: Family Medicine

## 2023-10-18 ENCOUNTER — Ambulatory Visit

## 2023-10-18 ENCOUNTER — Encounter: Payer: Self-pay | Admitting: Family Medicine

## 2023-10-18 VITALS — BP 140/85 | HR 78 | Temp 98.4°F | Ht 65.0 in | Wt 158.6 lb

## 2023-10-18 DIAGNOSIS — I10 Essential (primary) hypertension: Secondary | ICD-10-CM

## 2023-10-18 DIAGNOSIS — I639 Cerebral infarction, unspecified: Secondary | ICD-10-CM

## 2023-10-18 DIAGNOSIS — J449 Chronic obstructive pulmonary disease, unspecified: Secondary | ICD-10-CM

## 2023-10-18 DIAGNOSIS — N1832 Chronic kidney disease, stage 3b: Secondary | ICD-10-CM

## 2023-10-18 DIAGNOSIS — I69322 Dysarthria following cerebral infarction: Secondary | ICD-10-CM

## 2023-10-18 DIAGNOSIS — R29898 Other symptoms and signs involving the musculoskeletal system: Secondary | ICD-10-CM

## 2023-10-18 DIAGNOSIS — R270 Ataxia, unspecified: Secondary | ICD-10-CM | POA: Diagnosis not present

## 2023-10-18 DIAGNOSIS — J300 Vasomotor rhinitis: Secondary | ICD-10-CM

## 2023-10-18 DIAGNOSIS — I2510 Atherosclerotic heart disease of native coronary artery without angina pectoris: Secondary | ICD-10-CM | POA: Diagnosis not present

## 2023-10-18 DIAGNOSIS — I5032 Chronic diastolic (congestive) heart failure: Secondary | ICD-10-CM

## 2023-10-18 DIAGNOSIS — E1122 Type 2 diabetes mellitus with diabetic chronic kidney disease: Secondary | ICD-10-CM

## 2023-10-18 MED ORDER — NITROGLYCERIN 0.4 MG SL SUBL: 0.4000 mg | SUBLINGUAL_TABLET | SUBLINGUAL | 3 refills | Status: AC | PRN

## 2023-10-18 MED ORDER — IPRATROPIUM BROMIDE 0.06 % NA SOLN: 2.0000 | Freq: Four times a day (QID) | NASAL | 2 refills | Status: AC

## 2023-10-18 MED ORDER — FELODIPINE ER 5 MG PO TB24: 5.0000 mg | ORAL_TABLET | Freq: Every day | ORAL | 3 refills | Status: DC

## 2023-10-18 NOTE — Patient Instructions (Signed)
 [name]  Please review the attached list of medications and notify my office if there are any errors.   . Please bring all of your medications to every appointment so we can make sure that our medication list is the same as yours.

## 2023-10-19 ENCOUNTER — Ambulatory Visit

## 2023-10-19 DIAGNOSIS — R482 Apraxia: Secondary | ICD-10-CM

## 2023-10-19 DIAGNOSIS — R2681 Unsteadiness on feet: Secondary | ICD-10-CM | POA: Diagnosis not present

## 2023-10-19 DIAGNOSIS — R262 Difficulty in walking, not elsewhere classified: Secondary | ICD-10-CM | POA: Diagnosis not present

## 2023-10-19 DIAGNOSIS — R269 Unspecified abnormalities of gait and mobility: Secondary | ICD-10-CM | POA: Diagnosis not present

## 2023-10-19 DIAGNOSIS — M6281 Muscle weakness (generalized): Secondary | ICD-10-CM

## 2023-10-19 DIAGNOSIS — R2689 Other abnormalities of gait and mobility: Secondary | ICD-10-CM

## 2023-10-19 DIAGNOSIS — R4701 Aphasia: Secondary | ICD-10-CM

## 2023-10-19 LAB — CUP PACEART REMOTE DEVICE CHECK
Date Time Interrogation Session: 20250827104004
Implantable Pulse Generator Implant Date: 20250626

## 2023-10-19 NOTE — Therapy (Signed)
 OUTPATIENT SPEECH LANGUAGE PATHOLOGY APHASIA EVALUATION   Patient Name: Hannah Kirby MRN: [id number] DOB:[birth year], 78 y.o., female Today's Date: 10/19/2023  PCP: [name] Perry, MD REFERRING PROVIDER: [name] Perry, MD   End of Session - 10/19/23 1314     Visit Number 2    Number of Visits 23    Date for SLP Re-Evaluation 01/09/24    Progress Note Due on Visit 10    SLP Start Time 1400    SLP Stop Time  1445    SLP Time Calculation (min) 45 min    Activity Tolerance Patient tolerated treatment well          Past Medical History:  Diagnosis Date   Acute hemorrhagic colitis 08/08/2019   Anemia    Anxiety    Arthritis    C. difficile colitis 08/09/2019   CAD (coronary artery disease)    a. 02/2006 PCI: BMS x 2 to RCA, cath o/w without significant coronary disease; b. nuclear stress test 07/2014: No ischemia/infarct; c. 11/2017 MV: no isch/infarct, EF 55-65%; d. 03/2020 NSTEMI/PCI: LM nl, LAD min irregs, RI 25, small, LCX nl, RCA 30p/m ISR, 99d (3.0x15 Resolute Onyx DES).   Cataract 02/04/2018   Cerebrovascular accident (CVA) (HCC) 03/18/2023   dysarthria   Chronic bronchitis (HCC)    secondary to cigarette smoking   Chronic kidney disease (CKD), stage III (moderate) (HCC)    COPD (chronic obstructive pulmonary disease) (HCC)    Diabetes mellitus    Diastolic dysfunction    a. 07/2019 Echo: EF 60-65%, no rwma, mod LVH. Nl RV size/fxn; b. 03/2020 Echo: EF 60-65%, mod LVH, gr1 DD, nl RV size/fxn, mild AS.   FHx: allergies    GERD (gastroesophageal reflux disease)    Goiter    Granulomatous disease (HCC)    Hernia    Hyperlipidemia    Hypertension    Kidney stone on left side 2013   Microalbuminuria    NSTEMI (non-ST elevated myocardial infarction) (HCC) 03/23/2020   Obesity    Oxygen  deficiency    Panic attacks    PVC's (premature ventricular contractions)    a. 03/2018 Zio: Occas PVCs (2.5%). Triggered events assoc w/ PVC/PAC.   Retinal tear 2020   Smokers' cough  (HCC)    Spinal stenosis    Stroke (HCC) 10/29/2016   mild left side weakness   Stroke (HCC) 08/01/2019   Tobacco abuse    Past Surgical History:  Procedure Laterality Date   ABDOMINAL HYSTERECTOMY     BREAST SURGERY     CATARACT EXTRACTION W/PHACO Right 03/01/2017   Procedure: CATARACT EXTRACTION PHACO AND INTRAOCULAR LENS PLACEMENT (IOC) RIGHT DIABETIC;  Surgeon: Mittie Gaskin, MD;  Location: [facility];  Service: Ophthalmology;  Laterality: Right;   CATARACT EXTRACTION W/PHACO Left 03/22/2017   Procedure: CATARACT EXTRACTION PHACO AND INTRAOCULAR LENS PLACEMENT (IOC) LEFT DIABETIC;  Surgeon: Mittie Gaskin, MD;  Location: [facility];  Service: Ophthalmology;  Laterality: Left;  Diabetic - insulin  and oral meds   COLONOSCOPY WITH PROPOFOL  N/A 09/23/2014   Procedure: COLONOSCOPY WITH PROPOFOL ;  Surgeon: Gladis RAYMOND Mariner, MD;  Location: [facility] END[facility];  Service: Endoscopy;  Laterality: N/A;   COLONOSCOPY WITH PROPOFOL  N/A 01/11/2018   Procedure: COLONOSCOPY WITH PROPOFOL ;  Surgeon: Mariner Gladis RAYMOND, MD;  Location: [facility] ENDOSCOPY;  Service: Endoscopy;  Laterality: N/A;   COLONOSCOPY WITH PROPOFOL  N/A 04/24/2018   Procedure: COLONOSCOPY WITH PROPOFOL ;  Surgeon: Mariner Gladis RAYMOND, MD;  Location: [facility] ENDOSCOPY;  Service: Endoscopy;  Laterality: N/A;  COLONOSCOPY WITH PROPOFOL  N/A 11/19/2019   Procedure: COLONOSCOPY WITH PROPOFOL ;  Surgeon: Jinny Carmine, MD;  Location: [facility] ENDOSCOPY;  Service: Endoscopy;  Laterality: N/A;   CORONARY ANGIOPLASTY WITH STENT PLACEMENT  2008   CORONARY STENT INTERVENTION N/A 03/24/2020   Procedure: CORONARY STENT INTERVENTION;  Surgeon: Mady Bruckner, MD;  Location: [facility] INVASIVE CV LAB;  Service: Cardiovascular;  Laterality: N/A;   ESOPHAGOGASTRODUODENOSCOPY (EGD) WITH PROPOFOL  N/A 12/30/2014   Procedure: ESOPHAGOGASTRODUODENOSCOPY (EGD) WITH PROPOFOL ;  Surgeon: Gladis RAYMOND Mariner, MD;  Location: [facility] ENDOSCOPY;  Service:  Endoscopy;  Laterality: N/A;   ESOPHAGOGASTRODUODENOSCOPY (EGD) WITH PROPOFOL  N/A 07/19/2016   Procedure: ESOPHAGOGASTRODUODENOSCOPY (EGD) WITH PROPOFOL ;  Surgeon: Mariner Gladis RAYMOND, MD;  Location: [facility] ENDOSCOPY;  Service: Endoscopy;  Laterality: N/A;   ESOPHAGOGASTRODUODENOSCOPY (EGD) WITH PROPOFOL  N/A 04/24/2018   Procedure: ESOPHAGOGASTRODUODENOSCOPY (EGD) WITH PROPOFOL ;  Surgeon: Mariner Gladis RAYMOND, MD;  Location: [facility] ENDOSCOPY;  Service: Endoscopy;  Laterality: N/A;   EYE SURGERY  cataracts, detached retina   hysterectomy (other)     LEFT HEART CATH AND CORONARY ANGIOGRAPHY N/A 03/24/2020   Procedure: LEFT HEART CATH AND CORONARY ANGIOGRAPHY;  Surgeon: Mady Bruckner, MD;  Location: [facility] INVASIVE CV LAB;  Service: Cardiovascular;  Laterality: N/A;   Patient Active Problem List   Diagnosis Date Noted   Stroke (HCC) 08/05/2023   Chronic diastolic CHF (congestive heart failure) (HCC) 08/05/2023   Depression with anxiety 08/05/2023   Chronic kidney disease, stage 3b (HCC) 08/05/2023   Tobacco abuse 08/05/2023   Overweight (BMI 25.0-29.9) 08/05/2023   HLD (hyperlipidemia) 08/05/2023   CAD (coronary artery disease) 08/05/2023   HTN (hypertension) 08/05/2023   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) 03/27/2023   Dysarthria 03/17/2023   Chronic obstructive pulmonary disease (HCC) 03/17/2023   Dizziness 02/23/2023   Dehydration 02/20/2023   Restrictive airway disease 07/21/2020   Partial thickness rotator cuff tear 03/20/2020   Shoulder pain 03/05/2020   Retinal detachment, left 03/04/2020   Ataxia 02/28/2020   Difficulty walking 02/28/2020   Physical deconditioning 02/28/2020   Chronic diastolic congestive heart failure (HCC) 11/20/2019   Polyp of ascending colon    Hypokalemia 08/08/2019   TIA (transient ischemic attack) 08/01/2019   Benign hypertensive kidney disease with chronic kidney disease 04/25/2019   Type 2 diabetes mellitus with  diabetic chronic kidney disease (HCC) 04/25/2019   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   Chronic, continuous use of opioids 03/26/2018   History of adenomatous polyp of colon 01/22/2018   Ulceration of colon determined by endoscopy 01/22/2018   Diverticulosis of colon 01/22/2018   History of CVA (cerebrovascular accident) 11/08/2016   Weakness of left upper extremity 10/30/2016   AAA (abdominal aortic aneurysm) without rupture (HCC) 08/12/2016   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   Stage 3b chronic kidney disease (HCC) 07/27/2015   Chest pain at rest 05/06/2015   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HCC) 10/28/2014   Solitary pulmonary nodule on lung CT 08/20/2014   Allergic rhinitis 07/28/2014   CAFL (chronic airflow limitation) (HCC) 07/28/2014   Acid reflux 07/28/2014   Granuloma annulare    Back pain 11/12/2013   Lumbar scoliosis 10/30/2013   Lumbar radiculopathy 10/30/2013   Lumbar canal stenosis 10/30/2013   Calcium  blood increased 10/22/2013   Proteinuria 10/22/2013   Obesity 03/21/2011   Smokes with greater than 30 pack year history 03/21/2011   Edema 08/18/2010   Hyperlipidemia 03/25/2009   Coronary artery disease 03/25/2009   Primary hypertension 03/25/2009    ONSET DATE:  03/17/23 (admitted with stroke), 05/23/23 (referral date)  REFERRING DIAG:  R41.841 (ICD-10-CM) - Cognitive communication deficit  I63.9 (ICD-10-CM) - CVA (cerebral vascular accident) (HCC)  R47.02 (ICD-10-CM) - Dysphasia    THERAPY DIAG:  No diagnosis found.  Rationale for Evaluation and Treatment Rehabilitation  SUBJECTIVE:   SUBJECTIVE STATEMENT: Pt alert, pleasant, and cooperative.  Pt accompanied by: self and family member  PERTINENT HISTORY:  Pt is a 78 y.o. female who presents for communication evaluation in setting on stroke. Pt in ED 1/24-1/25/25. Pt d/c'd home with HH ST. PMHx significant of   HTN, CKD stage IIIB, DMT2 on insulin , CAD, chronic  smoking, chronic respiratory failure with COPD and on home oxygen , anxiety, HLD. MRI 03/17/23 1. Punctate foci of restricted diffusion in the left posterior  frontal white matter, most likely acute infarcts.  2. Numerous foci of hemosiderin deposition, which are seen in the  deep gray structures but also in the bilateral cerebral hemispheres,  with 1 new focus compared to 02/20/2023. While this could be the  sequela of chronic hypertensive microhemorrhages, this appearance is  concerning for cerebral amyloid angiopathy.   PAIN:  Are you having pain? No  FALLS: defer to PT  LIVING ENVIRONMENT: Lives with: lives with their spouse Lives in: House/apartment  PLOF:  Level of assistance: Independent with ADLs Employment: Retired   PATIENT GOALS    for communication to improve   OBJECTIVE:  TODAY'S TREATMENT:  Speech/language: Pt repeated phrases of increasing length: 3 words with occasional repetition, 4 words with min cues, and 6-7 words with mod/max cues. Pt benefit from repetition, direct clinical modeling for phonemic placement, and pacing strategy.  Supported communication: SLP demo'd supported communication. Pt able to participate in conversations with SLP with written cues, choices, and verbal responses. Pt answered bio questions utilizing speech, writing, and SGD with 100% accuracy and extra time/self-correction.  SGD programming/mod: Pt educated and demo'd understanding re: turning on/off device, navigating tabs, and using talk tab to communicate. Brainstormed additional phrases for communicating with dog as well as for time management (e.g. dates, DOW, MOY, times). Adjusted SGD voice to pt's preference and slowed down speech rate to help promote comprehension and facilitate pt's repetition of output. Sent list of customization requests to [facility] team.   PATIENT EDUCATION: Education details: as above Person educated: Patient and Child Education method:  Explanation Education comprehension: verbalized understanding and needs further education  HOME EXERCISE PROGRAM:    Play with SGD  Utilize to SGD for homework and for repetition    GOALS:  Goals reviewed with patient? Yes  SHORT TERM GOALS: Target date: 10 sessions  Pt will participate in further assessment of functional reading/writing. Baseline: Goal status: INITIAL  2.  With Moderate A, patient will complete a semantic feature analysis with at least 2 relevant features for 8/10 target words to improve word-finding skills.  Baseline:  Goal status: INITIAL  3.  With Maximal A, patient will generate sentences with 3 or more words in response to a situation at 80% accuracy in order to increase ability to communicate basic wants and needs.  Baseline:  Goal status: INITIAL  4.   With Maximal A,  pt will follow 2-step commands for improved participation in ADLs/iADLs with max cueing. Baseline:  Goal status: INITIAL  5.  Pt will repeat short sentences (<4 words) with good approximations 80% of the time with max cueing. Baseline:  Goal status: INITIAL    LONG TERM GOALS: Target date: 12 weeks  Pt  will report a subjective improvement in communication per PROM.  Baseline: CES 12/32 on 8/26 Goal status: INITIAL  2.  With Min A, patient/family will demonstrate understanding of the following concepts: aphasia, spontaneous recovery, communication vs conversation, strengths/strategies to promote success, local resources in order to increase patient's participation in medical care.    Baseline:  Goal status: INITIAL   ASSESSMENT:  CLINICAL IMPRESSION: Pt is a 78 y.o. female who presents for communication treatment in setting on stroke. Pt known to SLP services and was receiving OP ST prior to most recent stroke 08/05/23. Pt d/c'd home with [facility] ST who is working on securing SGD for pt. PMHx significant of  HTN, CKD stage IIIB, DMT2 on insulin , CAD, chronic smoking, chronic  respiratory failure with COPD and on home oxygen , anxiety, HLD. Assessment this episode completed via functional/dynamic means including Western Aphasia Battery Revised (WAB-R) and PROM (Communication Effectiveness Scale). 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 See details of tx session above. Recommend course of ST targeting functional communication, further assessment of functional reading/writing, and pt/caregiver training to help promote QoL and overall life participation.   OBJECTIVE IMPAIRMENTS include expressive language, receptive language, and aphasia. These impairments are limiting patient from ADLs/IADLs and effectively communicating at home and in community. Factors affecting potential to achieve goals and functional outcome are severity of impairments. Patient will benefit from skilled SLP services to address above impairments and improve overall function.  REHAB POTENTIAL: Good  PLAN: SLP FREQUENCY: 2x/week  SLP DURATION: 12 weeks  PLANNED INTERVENTIONS: Language facilitation, Cueing hierachy, Internal/external aids, Functional tasks, Multimodal communication approach, SLP instruction and feedback, Compensatory strategies, and Patient/family education    Delon Bangs, M.S., CCC-SLP Speech-Language Pathologist [place] [facility] [id number] [place])  [facility] Out[facility] at [facility] [address] [place], [place], [address] Phone: [phone]   Fax:  [phone]

## 2023-10-19 NOTE — Therapy (Signed)
 OUTPATIENT PHYSICAL THERAPY NEURO TREATMENT   Patient Name: Hannah Kirby MRN: [id number] DOB:Aug 09, 1945, 78 y.o., female Today's Date: 10/19/2023   PCP: Gasper [name] BRAVO, MD REFERRING PROVIDER: Gasper [name] BRAVO, MD  END OF SESSION:  PT End of Session - 10/19/23 1312     Visit Number 2    Number of Visits 25    Date for PT Re-Evaluation 01/09/24    PT Start Time 1315    PT Stop Time 1400    PT Time Calculation (min) 45 min          Past Medical History:  Diagnosis Date   Acute hemorrhagic colitis 08/08/2019   Anemia    Anxiety    Arthritis    C. difficile colitis 08/09/2019   CAD (coronary artery disease)    a. 02/2006 PCI: BMS x 2 to RCA, cath o/w without significant coronary disease; b. nuclear stress test 07/2014: No ischemia/infarct; c. 11/2017 MV: no isch/infarct, EF 55-65%; d. 03/2020 NSTEMI/PCI: LM nl, LAD min irregs, RI 25, small, LCX nl, RCA 30p/m ISR, 99d (3.0x15 Resolute Onyx DES).   Cataract 02/04/2018   Cerebrovascular accident (CVA) (HCC) 03/18/2023   dysarthria   Chronic bronchitis (HCC)    secondary to cigarette smoking   Chronic kidney disease (CKD), stage III (moderate) (HCC)    COPD (chronic obstructive pulmonary disease) (HCC)    Diabetes mellitus    Diastolic dysfunction    a. 07/2019 Echo: EF 60-65%, no rwma, mod LVH. Nl RV size/fxn; b. 03/2020 Echo: EF 60-65%, mod LVH, gr1 DD, nl RV size/fxn, mild AS.   FHx: allergies    GERD (gastroesophageal reflux disease)    Goiter    Granulomatous disease (HCC)    Hernia    Hyperlipidemia    Hypertension    Kidney stone on left side 2013   Microalbuminuria    NSTEMI (non-ST elevated myocardial infarction) (HCC) 03/23/2020   Obesity    Oxygen  deficiency    Panic attacks    PVC's (premature ventricular contractions)    a. 03/2018 Zio: Occas PVCs (2.5%). Triggered events assoc w/ PVC/PAC.   Retinal tear 2020   Smokers' cough (HCC)    Spinal stenosis    Stroke (HCC) 10/29/2016   mild left side  weakness   Stroke (HCC) 08/01/2019   Tobacco abuse    Past Surgical History:  Procedure Laterality Date   ABDOMINAL HYSTERECTOMY     BREAST SURGERY     CATARACT EXTRACTION W/PHACO Right 03/01/2017   Procedure: CATARACT EXTRACTION PHACO AND INTRAOCULAR LENS PLACEMENT (IOC) RIGHT DIABETIC;  Surgeon: Mittie Gaskin, MD;  Location: [facility];  Service: Ophthalmology;  Laterality: Right;   CATARACT EXTRACTION W/PHACO Left 03/22/2017   Procedure: CATARACT EXTRACTION PHACO AND INTRAOCULAR LENS PLACEMENT (IOC) LEFT DIABETIC;  Surgeon: Mittie Gaskin, MD;  Location: [facility];  Service: Ophthalmology;  Laterality: Left;  Diabetic - insulin  and oral meds   COLONOSCOPY WITH PROPOFOL  N/A 09/23/2014   Procedure: COLONOSCOPY WITH PROPOFOL ;  Surgeon: Gladis RAYMOND Mariner, MD;  Location: [facility] ENDOSCOPY;  Service: Endoscopy;  Laterality: N/A;   COLONOSCOPY WITH PROPOFOL  N/A 01/11/2018   Procedure: COLONOSCOPY WITH PROPOFOL ;  Surgeon: Mariner Gladis RAYMOND, MD;  Location: [facility] ENDOSCOPY;  Service: Endoscopy;  Laterality: N/A;   COLONOSCOPY WITH PROPOFOL  N/A 04/24/2018   Procedure: COLONOSCOPY WITH PROPOFOL ;  Surgeon: Mariner Gladis RAYMOND, MD;  Location: [facility] ENDOSCOPY;  Service: Endoscopy;  Laterality: N/A;   COLONOSCOPY WITH PROPOFOL  N/A 11/19/2019   Procedure: COLONOSCOPY WITH PROPOFOL ;  Surgeon: Jinny Carmine, MD;  Location: [facility] ENDOSCOPY;  Service: Endoscopy;  Laterality: N/A;   CORONARY ANGIOPLASTY WITH STENT PLACEMENT  2008   CORONARY STENT INTERVENTION N/A 03/24/2020   Procedure: CORONARY STENT INTERVENTION;  Surgeon: Mady Bruckner, MD;  Location: [facility] INVASIVE CV LAB;  Service: Cardiovascular;  Laterality: N/A;   ESOPHAGOGASTRODUODENOSCOPY (EGD) WITH PROPOFOL  N/A 12/30/2014   Procedure: ESOPHAGOGASTRODUODENOSCOPY (EGD) WITH PROPOFOL ;  Surgeon: Gladis RAYMOND Mariner, MD;  Location: [facility] ENDOSCOPY;  Service: Endoscopy;  Laterality: N/A;   ESOPHAGOGASTRODUODENOSCOPY (EGD) WITH  PROPOFOL  N/A 07/19/2016   Procedure: ESOPHAGOGASTRODUODENOSCOPY (EGD) WITH PROPOFOL ;  Surgeon: Mariner Gladis RAYMOND, MD;  Location: [facility] ENDOSCOPY;  Service: Endoscopy;  Laterality: N/A;   ESOPHAGOGASTRODUODENOSCOPY (EGD) WITH PROPOFOL  N/A 04/24/2018   Procedure: ESOPHAGOGASTRODUODENOSCOPY (EGD) WITH PROPOFOL ;  Surgeon: Mariner Gladis RAYMOND, MD;  Location: [facility] ENDOSCOPY;  Service: Endoscopy;  Laterality: N/A;   EYE SURGERY  cataracts, detached retina   hysterectomy (other)     LEFT HEART CATH AND CORONARY ANGIOGRAPHY N/A 03/24/2020   Procedure: LEFT HEART CATH AND CORONARY ANGIOGRAPHY;  Surgeon: Mady Bruckner, MD;  Location: [facility] INVASIVE CV LAB;  Service: Cardiovascular;  Laterality: N/A;   Patient Active Problem List   Diagnosis Date Noted   Stroke (HCC) 08/05/2023   Chronic diastolic CHF (congestive heart failure) (HCC) 08/05/2023   Depression with anxiety 08/05/2023   Chronic kidney disease, stage 3b (HCC) 08/05/2023   Tobacco abuse 08/05/2023   Overweight (BMI 25.0-29.9) 08/05/2023   HLD (hyperlipidemia) 08/05/2023   CAD (coronary artery disease) 08/05/2023   HTN (hypertension) 08/05/2023   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) 03/27/2023   Dysarthria 03/17/2023   Chronic obstructive pulmonary disease (HCC) 03/17/2023   Dizziness 02/23/2023   Dehydration 02/20/2023   Restrictive airway disease 07/21/2020   Partial thickness rotator cuff tear 03/20/2020   Shoulder pain 03/05/2020   Retinal detachment, left 03/04/2020   Ataxia 02/28/2020   Difficulty walking 02/28/2020   Physical deconditioning 02/28/2020   Chronic diastolic congestive heart failure (HCC) 11/20/2019   Polyp of ascending colon    Hypokalemia 08/08/2019   TIA (transient ischemic attack) 08/01/2019   Benign hypertensive kidney disease with chronic kidney disease 04/25/2019   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   Secondary  hyperparathyroidism of renal origin (HCC) 10/24/2018   Chronic, continuous use of opioids 03/26/2018   History of adenomatous polyp of colon 01/22/2018   Ulceration of colon determined by endoscopy 01/22/2018   Diverticulosis of colon 01/22/2018   History of CVA (cerebrovascular accident) 11/08/2016   Weakness of left upper extremity 10/30/2016   AAA (abdominal aortic aneurysm) without rupture (HCC) 08/12/2016   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   Stage 3b chronic kidney disease (HCC) 07/27/2015   Chest pain at rest 05/06/2015   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HCC) 10/28/2014   Solitary pulmonary nodule on lung CT 08/20/2014   Allergic rhinitis 07/28/2014   CAFL (chronic airflow limitation) (HCC) 07/28/2014   Acid reflux 07/28/2014   Granuloma annulare    Back pain 11/12/2013   Lumbar scoliosis 10/30/2013   Lumbar radiculopathy 10/30/2013   Lumbar canal stenosis 10/30/2013   Calcium  blood increased 10/22/2013   Proteinuria 10/22/2013   Obesity 03/21/2011   Smokes with greater than 30 pack year history 03/21/2011   Edema 08/18/2010   Hyperlipidemia 03/25/2009   Coronary artery disease 03/25/2009   Primary hypertension 03/25/2009    ONSET DATE: 08/05/23  REFERRING DIAG:  Z86.73 (ICD-10-CM) - History of CVA (cerebrovascular  accident)  R27.0 (ICD-10-CM) - Ataxia    THERAPY DIAG:  Abnormality of gait and mobility  Difficulty in walking, not elsewhere classified  Muscle weakness (generalized)  Other abnormalities of gait and mobility  Unsteadiness on feet  Rationale for Evaluation and Treatment: Rehabilitation  SUBJECTIVE:                                                                                                                                                                                             SUBJECTIVE STATEMENT:  Pt reports doing well since the evaluation.  Pt denies any falls or stumbles.    Pt accompanied by: self and family member,  daughter  PERTINENT HISTORY:  PMH: of CVA, COPD, Dysarthria, Ataxia, TIA, T2DM, Back pain, scoliosis   PAIN:  Are you having pain? Yes: NPRS scale: 3/10 Pain location: low back and L hip Pain description: achy Aggravating factors: just hurts all the time. Relieving factors: hydrocodone  at night  PRECAUTIONS: Other: Pt has a looper to see if pt has a-fib  RED FLAGS: Abdominal aneurysm: Yes: pt reports having an abdominal aneurism as found by Dr. Marea.   WEIGHT BEARING RESTRICTIONS: No  FALLS: Has patient fallen in last 6 months? Yes. Number of falls 1.  Pt reports losing balance, and got caught up in cords that were on the floor when she was trying to close the blinds.  LIVING ENVIRONMENT: Lives with: lives with their spouse Lives in: House/apartment Stairs: Yes: External: 1 steps; none Has following equipment at home: Single point cane, Walker - 2 wheeled, Environmental consultant - 4 wheeled, shower chair, and Grab bars  PLOF: Independent  PATIENT GOALS: Pt is wanting to gain independence.  Pt is wanting to improve endurance levels and improving speech with SLP.  OBJECTIVE:  Note: Objective measures were completed at Evaluation unless otherwise noted.  DIAGNOSTIC FINDINGS:   EXAM: MRI HEAD WITHOUT CONTRAST   MRA HEAD WITHOUT CONTRAST   MRA NECK WITHOUT AND WITH CONTRAST   IMPRESSION: 1. Small acute infarcts in the left frontal lobe, right parietal lobe, and left cerebellum. 2. Extensive chronic small vessel ischemic disease. 3. 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 4. Unchanged severe right A3 stenosis or segmental occlusion. 5. Moderate left P2 stenosis. 6. Limited assessment of the left vertebral artery origin, otherwise negative neck MRA.   COGNITION: Overall cognitive status: Within functional limits for tasks assessed   SENSATION: WFL, however pt notes having some numbness in the R hand.  COORDINATION: WFL  POSTURE: rounded shoulders, forward head,  decreased lumbar lordosis, increased thoracic kyphosis, posterior pelvic tilt, and flexed trunk  LOWER EXTREMITY MMT:    MMT Right Eval Left Eval  Hip flexion 4 4  Hip extension    Hip abduction 4 4-  Hip adduction 4 4-  Hip internal rotation    Hip external rotation    Knee flexion 4 4-  Knee extension 4 4-  Ankle dorsiflexion 4 4-  Ankle plantarflexion    Ankle inversion    Ankle eversion    (Blank rows = not tested)  BED MOBILITY:  Not tested  FUNCTIONAL TESTS:  5 times sit to stand: 24.36 sec Timed up and go (TUG): 23.94 sec with walker 2 minute walk test: TBD 10 meter walk test: 20.33 sec with walker Berg Balance Scale: TBD  PATIENT SURVEYS:   Stroke Impact Scale 16 (Copyrighted instrument, [facility])  In the past 2 weeks, how difficult was it to...  Rating Scale 5 = Not difficult at all 4 = A little difficult 3 = Somewhat difficult 2 = Very difficult 1 = Could not do at all  a. Dress the top part of your body? 4  b. Bathe yourself? 2  c. Get to the toilet on time? 5  d. Control your bladder (not have an accident)? 5  e. Control your bowels (not have an accident)? 5  f. Stand without losing balance? 3  g. Go shopping? 2  h. Do heavy household chores (e.g. vacuum, laundry or  yard work)? 1  i. Stay sitting without losing your balance? 3  j. Walk without losing your balance? 2  k. Move from a bed to a chair? 3  l. Walk fast? 1  m. Climb one flight of stairs? 1  n. Walk one block? 1  o. Get in and out of a car? 2  p. Carry heavy objects (e.g. bag of groceries) with your  affected hand? 1  Sum:  41/80  MDC (Minimal Detectable Change) is >/=8                                                                                                                                 TREATMENT DATE: 10/19/23  Item Test date: 10/19/23  Sitting to standing 3. able to stand independently using hands  2. Standing unsupported 4. able to  stand safely for 2 minutes  3. Sitting with back unsupported, feet supported 4. able to sit safely and securely for 2 minutes  4. Standing to sitting 3. controls descent by using hands  5. Pivot transfer  2. able to transfer with verbal cuing and/or supervision  6. Standing unsupported with eyes closed 3. able to stand 10 seconds with supervision  7. Standing unsupported with feet together 3. able to place feet together independently and stand 1 minute with supervision  8. Reaching forward with outstretched arms while standing 3. can reach forward 12 cm (5 inches)  9. Pick up object from the floor from standing 3. able to pick up  slipper but needs supervision  10. Turning to look behind over left and right shoulders while standing 1. needs supervision when turning  11. Turn 360 degrees 1. needs close supervision or verbal cuing  12. Place alternate foot on step or stool while standing unsupported 0. needs assistance to keep from falling/unable to try  13. Standing unsupported one foot in front 2. able to take small step independently and hold 30 seconds  14. Standing on one leg 1. tries to lift leg unable to hold 3 seconds but remains standing independently.    Total Score 33/56    SpO2: 97% upon arrival to the clinic HR: 77 bpm  2 Min Walk Test:  PT instructed patient to ambulate as quickly and as safely as possible for 2 minutes using LRAD. Patient was allowed to take standing rest breaks or slow pace without stopping the test, but if the patient required a sitting rest break the clock would be stopped and the test would be over.  Results: 275 feet (83.82 m) using a FWW with nasal cannula set to 3L of O2 and requiring CGA for safety. Results indicate that the patient has reduced endurance with ambulation compared to age matched norms.  Normative data for retirement dwelling older adults: 150.4 meters MDC for older adults: 12.2 meters   PATIENT EDUCATION: Education details: Pt educated on  role of PT and services provided during current POC, along with prognosis and information about the clinic.  Person educated: Patient and Child(ren) Education method: Explanation Education comprehension: verbalized understanding and returned demonstration  HOME EXERCISE PROGRAM:  Access Code: [email or web address] URL: [email or web address] Date: 10/17/2023 Prepared by: Sidra Simpers  Exercises - Standing March with Counter Support  - 1 x daily - 3-4 x weekly - 3 sets - 10 reps - Standing Knee Flexion with Unilateral Counter Support  - 1 x daily - 7 x weekly - 3 sets - 10 reps - Standing Hip Extension with Unilateral Counter Support  - 1 x daily - 7 x weekly - 3 sets - 10 reps - Standing Hip Abduction with Unilateral Counter Support  - 1 x daily - 7 x weekly - 3 sets - 10 reps - Heel Toe Raises with Unilateral Counter Support  - 1 x daily - 7 x weekly - 3 sets - 10 reps  GOALS: Goals reviewed with patient? Yes  SHORT TERM GOALS: Target date: 11/15/2023  Pt will be independent with HEP in order to demonstrate increased ability to perform tasks related to occupation/hobbies. Baseline: pt given HEP at eval Goal status: INITIAL  LONG TERM GOALS: Target date: 01/10/2024  1.  Patient (> 22 years old) will complete five times sit to stand test in < 15 seconds indicating an increased LE strength and improved balance. Baseline: 24.36 sec Goal status: INITIAL  2.  Patient will improve SIS 16 score to 55   to demonstrate statistically significant improvement in mobility and quality of life as it relates to their CVA.  Baseline: 41 Goal status: INITIAL   3.  Patient will increase Berg Balance score by > 6 points to demonstrate decreased fall risk during functional activities. Baseline: 33/56 Goal status: INITIAL   4.  Patient will reduce timed up and go to <11 seconds to reduce fall risk and demonstrate improved transfer/gait ability. Baseline: 23.94 sec with walker Goal status:  INITIAL  5.  Patient will increase 10 meter walk test to >1.66m/s as to improve gait speed for better community ambulation and to reduce  fall risk. Baseline: 20.33 sec with walker Goal status: INITIAL  6.  Patient will increase 2 minute walk test distance by 50 ft or greater for progression to community ambulator and demonstrate improved gait ability  Baseline: 275'; 83.82 m Goal status: INITIAL     ASSESSMENT:  CLINICAL IMPRESSION:  Pt performed well with the tasks given necessary to establish the rest of her current goals.  Pt ultimately limited by O2 saturation levels and needed to be cued for pursed lip breathing throughout the session.  Pt maintained SpO2 > 90% on 3L of O2, and when on room air, she dropped to 88% at the lowest which warranted the use of the supplemental oxygen .  Pt will be introduced to endurance training while keeping a monitor on oxygen  response during the subsequent sessions.  Pt will continue to benefit from skilled therapy to address remaining deficits in order to improve overall QoL and return to PLOF.      OBJECTIVE IMPAIRMENTS: Abnormal gait, decreased activity tolerance, decreased balance, decreased endurance, decreased knowledge of use of DME, decreased mobility, difficulty walking, decreased strength, impaired sensation, and pain.   ACTIVITY LIMITATIONS: carrying, lifting, bending, standing, squatting, stairs, transfers, bathing, toileting, dressing, hygiene/grooming, and locomotion level  PARTICIPATION LIMITATIONS: meal prep, cleaning, laundry, community activity, and yard work  PERSONAL FACTORS: Age and 3+ comorbidities:  PMH: of CVA, COPD, Dysarthria, Ataxia, TIA, T2DM, Back pain, scoliosis are also affecting patient's functional outcome.   REHAB POTENTIAL: Good  CLINICAL DECISION MAKING: Evolving/moderate complexity  EVALUATION COMPLEXITY: Moderate  PLAN:  PT FREQUENCY: 2x/week  PT DURATION: 12 weeks  PLANNED INTERVENTIONS: 97750- Physical  Performance Testing, 97110-Therapeutic exercises, 97530- Therapeutic activity, 97112- Neuromuscular re-education, 97535- Self Care, [id number]- Manual therapy, [id number]- Gait training, Balance training, and Vestibular training  PLAN FOR NEXT SESSION:  Monitor SPO2 and get portable oxygen  if necessary  Endurance, balance, strength ( L>R), administer HEP, gait training    Fonda Simpers, PT, DPT Physical Therapist - [facility]  [facility]  10/19/23, 5:22 PM

## 2023-10-22 ENCOUNTER — Ambulatory Visit: Payer: Self-pay | Admitting: Cardiology

## 2023-10-23 ENCOUNTER — Encounter: Payer: Self-pay | Admitting: Family Medicine

## 2023-10-23 ENCOUNTER — Encounter

## 2023-10-24 ENCOUNTER — Other Ambulatory Visit: Payer: Self-pay | Admitting: Family Medicine

## 2023-10-24 ENCOUNTER — Ambulatory Visit

## 2023-10-24 ENCOUNTER — Ambulatory Visit: Attending: Family Medicine

## 2023-10-24 DIAGNOSIS — R4701 Aphasia: Secondary | ICD-10-CM | POA: Insufficient documentation

## 2023-10-24 DIAGNOSIS — R2689 Other abnormalities of gait and mobility: Secondary | ICD-10-CM | POA: Diagnosis not present

## 2023-10-24 DIAGNOSIS — R269 Unspecified abnormalities of gait and mobility: Secondary | ICD-10-CM | POA: Insufficient documentation

## 2023-10-24 DIAGNOSIS — R278 Other lack of coordination: Secondary | ICD-10-CM | POA: Insufficient documentation

## 2023-10-24 DIAGNOSIS — I69890 Apraxia following other cerebrovascular disease: Secondary | ICD-10-CM | POA: Diagnosis not present

## 2023-10-24 DIAGNOSIS — R2681 Unsteadiness on feet: Secondary | ICD-10-CM | POA: Diagnosis not present

## 2023-10-24 DIAGNOSIS — R42 Dizziness and giddiness: Secondary | ICD-10-CM | POA: Diagnosis not present

## 2023-10-24 DIAGNOSIS — R482 Apraxia: Secondary | ICD-10-CM | POA: Insufficient documentation

## 2023-10-24 DIAGNOSIS — R262 Difficulty in walking, not elsewhere classified: Secondary | ICD-10-CM | POA: Insufficient documentation

## 2023-10-24 DIAGNOSIS — I10 Essential (primary) hypertension: Secondary | ICD-10-CM

## 2023-10-24 DIAGNOSIS — M6281 Muscle weakness (generalized): Secondary | ICD-10-CM | POA: Diagnosis not present

## 2023-10-24 NOTE — Therapy (Signed)
 OUTPATIENT SPEECH LANGUAGE PATHOLOGY APHASIA EVALUATION   Patient Name: Hannah Kirby MRN: [id number] DOB:10/22/1945, 78 y.o., female Today's Date: 10/24/2023  PCP: [name] Perry, MD REFERRING PROVIDER: [name] Perry, MD   End of Session - 10/24/23 1314     Visit Number 3    Number of Visits 23    Date for SLP Re-Evaluation 01/09/24    Progress Note Due on Visit 10    SLP Start Time 1315    SLP Stop Time  1400    SLP Time Calculation (min) 45 min    Activity Tolerance Patient tolerated treatment well          Past Medical History:  Diagnosis Date   Acute hemorrhagic colitis 08/08/2019   Anemia    C. difficile colitis 08/09/2019   CAD (coronary artery disease)    a. 02/2006 PCI: BMS x 2 to RCA, cath o/w without significant coronary disease; b. nuclear stress test 07/2014: No ischemia/infarct; c. 11/2017 MV: no isch/infarct, EF 55-65%; d. 03/2020 NSTEMI/PCI: LM nl, LAD min irregs, RI 25, small, LCX nl, RCA 30p/m ISR, 99d (3.0x15 Resolute Onyx DES).   Cataract 02/04/2018   Cerebrovascular accident (CVA) (HCC) 03/18/2023   dysarthria   Chronic bronchitis (HCC)    secondary to cigarette smoking   FHx: allergies    Goiter    Granulomatous disease (HCC)    Hernia    Kidney stone on left side 2013   NSTEMI (non-ST elevated myocardial infarction) (HCC) 03/23/2020   Oxygen  deficiency    Panic attacks    PVC's (premature ventricular contractions)    a. 03/2018 Zio: Occas PVCs (2.5%). Triggered events assoc w/ PVC/PAC.   Retinal detachment, left 03/04/2020   Retinal tear 2020   Stroke (HCC) 10/29/2016   mild left side weakness   Stroke (HCC) 08/01/2019   Tobacco abuse    Past Surgical History:  Procedure Laterality Date   ABDOMINAL HYSTERECTOMY     BREAST SURGERY     CATARACT EXTRACTION W/PHACO Right 03/01/2017   Procedure: CATARACT EXTRACTION PHACO AND INTRAOCULAR LENS PLACEMENT (IOC) RIGHT DIABETIC;  Surgeon: Mittie Gaskin, MD;  Location: [facility];   Service: Ophthalmology;  Laterality: Right;   CATARACT EXTRACTION W/PHACO Left 03/22/2017   Procedure: CATARACT EXTRACTION PHACO AND INTRAOCULAR LENS PLACEMENT (IOC) LEFT DIABETIC;  Surgeon: Mittie Gaskin, MD;  Location: [facility];  Service: Ophthalmology;  Laterality: Left;  Diabetic - insulin  and oral meds   COLONOSCOPY WITH PROPOFOL  N/A 09/23/2014   Procedure: COLONOSCOPY WITH PROPOFOL ;  Surgeon: Gladis RAYMOND Mariner, MD;  Location: [facility] ENDOSCOPY;  Service: Endoscopy;  Laterality: N/A;   COLONOSCOPY WITH PROPOFOL  N/A 01/11/2018   Procedure: COLONOSCOPY WITH PROPOFOL ;  Surgeon: Mariner Gladis RAYMOND, MD;  Location: [facility] ENDOSCOPY;  Service: Endoscopy;  Laterality: N/A;   COLONOSCOPY WITH PROPOFOL  N/A 04/24/2018   Procedure: COLONOSCOPY WITH PROPOFOL ;  Surgeon: Mariner Gladis RAYMOND, MD;  Location: [facility] ENDOSCOPY;  Service: Endoscopy;  Laterality: N/A;   COLONOSCOPY WITH PROPOFOL  N/A 11/19/2019   Procedure: COLONOSCOPY WITH PROPOFOL ;  Surgeon: Jinny Carmine, MD;  Location: [facility] ENDOSCOPY;  Service: Endoscopy;  Laterality: N/A;   CORONARY ANGIOPLASTY WITH STENT PLACEMENT  2008   CORONARY STENT INTERVENTION N/A 03/24/2020   Procedure: CORONARY STENT INTERVENTION;  Surgeon: Mady Bruckner, MD;  Location: [facility] INVASIVE CV LAB;  Service: Cardiovascular;  Laterality: N/A;   ESOPHAGOGASTRODUODENOSCOPY (EGD) WITH PROPOFOL  N/A 12/30/2014   Procedure: ESOPHAGOGASTRODUODENOSCOPY (EGD) WITH PROPOFOL ;  Surgeon: Gladis RAYMOND Mariner, MD;  Location[facility] END[facility];  Service: Endoscopy;  Laterality: N/A;   ESOPHAGOGASTRODUODENOSCOPY (EGD) WITH PROPOFOL  N/A 07/19/2016   Procedure: ESOPHAGOGASTRODUODENOSCOPY (EGD) WITH PROPOFOL ;  Surgeon: Gaylyn Gladis PENNER, MD;  Location: [facility] ENDOSCOPY;  Service: Endoscopy;  Laterality: N/A;   ESOPHAGOGASTRODUODENOSCOPY (EGD) WITH PROPOFOL  N/A 04/24/2018   Procedure: ESOPHAGOGASTRODUODENOSCOPY (EGD) WITH PROPOFOL ;  Surgeon: Gaylyn Gladis PENNER, MD;  Location: [facility]  ENDOSCOPY;  Service: Endoscopy;  Laterality: N/A;   EYE SURGERY  cataracts, detached retina   hysterectomy (other)     LEFT HEART CATH AND CORONARY ANGIOGRAPHY N/A 03/24/2020   Procedure: LEFT HEART CATH AND CORONARY ANGIOGRAPHY;  Surgeon: Mady Bruckner, MD;  Location: [facility] INVASIVE CV LAB;  Service: Cardiovascular;  Laterality: N/A;   Patient Active Problem List   Diagnosis Date Noted   Stroke (HCC) 08/05/2023   Depression with anxiety 08/05/2023   Overweight (BMI 25.0-29.9) 08/05/2023   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) 03/17/2023   Dizziness 02/23/2023   Restrictive airway disease 07/21/2020   Partial thickness rotator cuff tear 03/20/2020   Shoulder pain 03/05/2020   Ataxia 02/28/2020   Difficulty walking 02/28/2020   Physical deconditioning 02/28/2020   Chronic diastolic congestive heart failure (HCC) 11/20/2019   Polyp of ascending colon    Hypokalemia 08/08/2019   TIA (transient ischemic attack) 08/01/2019   Benign hypertensive kidney disease with chronic kidney disease 04/25/2019   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   Chronic, continuous use of opioids 03/26/2018   History of adenomatous polyp of colon 01/22/2018   Diverticulosis of colon 01/22/2018   History of CVA (cerebrovascular accident) 11/08/2016   Weakness of left upper extremity 10/30/2016   AAA (abdominal aortic aneurysm) without rupture (HCC) 08/12/2016   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   Stage 3b chronic kidney disease (HCC) 07/27/2015   Type 2 diabetes mellitus with diabetic chronic kidney disease (HCC) 10/28/2014   Solitary pulmonary nodule on lung CT 08/20/2014   Allergic rhinitis 07/28/2014   Acid reflux 07/28/2014   Chronic obstructive pulmonary disease (HCC) 07/28/2014   Granuloma annulare    Back pain 11/12/2013   Lumbar scoliosis 10/30/2013   Lumbar radiculopathy 10/30/2013   Lumbar canal stenosis 10/30/2013   Proteinuria 10/22/2013    Smokes with greater than 30 pack year history 03/21/2011   Edema 08/18/2010   Hyperlipidemia 03/25/2009   Coronary artery disease 03/25/2009   Primary hypertension 03/25/2009    ONSET DATE: 03/17/23 (admitted with stroke), 05/23/23 (referral date)  REFERRING DIAG:  R41.841 (ICD-10-CM) - Cognitive communication deficit  I63.9 (ICD-10-CM) - CVA (cerebral vascular accident) (HCC)  R47.02 (ICD-10-CM) - Dysphasia    THERAPY DIAG:  Aphasia  Apraxia of speech  Rationale for Evaluation and Treatment Rehabilitation  SUBJECTIVE:   SUBJECTIVE STATEMENT: Pt alert, pleasant, and cooperative.  Pt accompanied by: self and family member  PERTINENT HISTORY:  Pt is a 78 y.o. female who presents for communication evaluation in setting on stroke. Pt in ED 1/24-1/25/25. Pt d/c'd home with HH ST. PMHx significant of   HTN, CKD stage IIIB, DMT2 on insulin , CAD, chronic smoking, chronic respiratory failure with COPD and on home oxygen , anxiety, HLD. MRI 03/17/23 1. Punctate foci of restricted diffusion in the left posterior  frontal white matter, most likely acute infarcts.  2. Numerous foci of hemosiderin deposition, which are seen in the  deep gray structures but also in the bilateral cerebral hemispheres,  with 1 new focus compared to 02/20/2023. 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   PAIN:  Are you having pain? No  FALLS: defer to PT  LIVING ENVIRONMENT: Lives with: lives with their spouse Lives in: House/apartment  PLOF:  Level of assistance: Independent with ADLs Employment: Retired   PATIENT GOALS    for communication to improve   OBJECTIVE:  TODAY'S TREATMENT:  Speech/language: Pt repeated phrases of increasing length: 3 words with occasional repetition, 4 words with min cues, and 6-7 words with mod/max cues. Pt benefit from repetition, direct clinical modeling for  phonemic placement, and pacing strategy.  Supported communication: SLP demo'd supported communication. Pt able to participate in conversations with SLP with written cues, choices, and verbal responses. Pt answered bio questions utilizing speech, writing, and SGD with 100% accuracy and extra time/self-correction. Reviewed SFA. Pt required mod cues to use SFA during structured task with SLP and min cues during barrier task with daughter.  PATIENT EDUCATION: Education details: as above Person educated: Patient and Child Education method: Explanation Education comprehension: verbalized understanding and needs further education  HOME EXERCISE PROGRAM:    Play with SGD  Utilize to SGD for homework and for repetition    GOALS:  Goals reviewed with patient? Yes  SHORT TERM GOALS: Target date: 10 sessions  Pt will participate in further assessment of functional reading/writing. Baseline: Goal status: INITIAL  2.  With Moderate A, patient will complete a semantic feature analysis with at least 2 relevant features for 8/10 target words to improve word-finding skills.  Baseline:  Goal status: INITIAL  3.  With Maximal A, patient will generate sentences with 3 or more words in response to a situation at 80% accuracy in order to increase ability to communicate basic wants and needs.  Baseline:  Goal status: INITIAL  4.   With Maximal A,  pt will follow 2-step commands for improved participation in ADLs/iADLs with max cueing. Baseline:  Goal status: INITIAL  5.  Pt will repeat short sentences (<4 words) with good approximations 80% of the time with max cueing. Baseline:  Goal status: INITIAL    LONG TERM GOALS: Target date: 12 weeks  Pt will report a subjective improvement in communication per PROM.  Baseline: CES 12/32 on 8/26 Goal status: INITIAL  2.  With Min A, patient/family will demonstrate understanding of the following concepts: aphasia, spontaneous recovery,  communication vs conversation, strengths/strategies to promote success, local resources in order to increase patient's participation in medical care.    Baseline:  Goal status: INITIAL   ASSESSMENT:  CLINICAL IMPRESSION: Pt is a 78 y.o. female who presents for communication treatment in setting on stroke. Pt known to SLP services and was receiving OP ST prior to most recent stroke 08/05/23. Pt d/c'd home with [facility] who is working on securing SGD for pt. PMHx significant of  HTN, CKD stage IIIB, DMT2 on insulin , CAD, chronic smoking, chronic respiratory failure with COPD and on home oxygen , anxiety, HLD. Assessment this episode completed via functional/dynamic means including Western Aphasia Battery Revised (WAB-R) and PROM (Communication Effectiveness Scale). 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 See details of tx session above. Recommend course of ST targeting functional communication, further assessment of functional reading/writing, and pt/caregiver training to help promote QoL and overall life participation.   OBJECTIVE IMPAIRMENTS include expressive language, receptive language, and aphasia. These impairments are limiting patient from ADLs/IADLs and effectively communicating at home and in community. Factors affecting potential to achieve goals and  functional outcome are severity of impairments. Patient will benefit from skilled SLP services to address above impairments and improve overall function.  REHAB POTENTIAL: Good  PLAN: SLP FREQUENCY: 2x/week  SLP DURATION: 12 weeks  PLANNED INTERVENTIONS: Language facilitation, Cueing hierachy, Internal/external aids, Functional tasks, Multimodal communication approach, SLP instruction and feedback, Compensatory strategies, and Patient/family education    Delon Bangs, M.S., CCC-SLP Speech-Language Pathologist [place] [facility] [phone] [place])  [facility] [address] [place], [place], [address] Phone: [phone]   Fax:  [phone]

## 2023-10-24 NOTE — Therapy (Unsigned)
 OUTPATIENT PHYSICAL THERAPY NEURO TREATMENT   Patient Name: Hannah Kirby MRN: [id number] DOB:12/24/1945, 78 y.o., female Today's Date: 10/25/2023   PCP: Gasper [name] BRAVO, MD REFERRING PROVIDER: Gasper [name] BRAVO, MD  END OF SESSION:  PT End of Session - 10/24/23 1406     Visit Number 3    Number of Visits 25    Date for PT Re-Evaluation 01/09/24    PT Start Time 1403    PT Stop Time 1445    PT Time Calculation (min) 42 min          Past Medical History:  Diagnosis Date   Acute hemorrhagic colitis 08/08/2019   Anemia    C. difficile colitis 08/09/2019   CAD (coronary artery disease)    a. 02/2006 PCI: BMS x 2 to RCA, cath o/w without significant coronary disease; b. nuclear stress test 07/2014: No ischemia/infarct; c. 11/2017 MV: no isch/infarct, EF 55-65%; d. 03/2020 NSTEMI/PCI: LM nl, LAD min irregs, RI 25, small, LCX nl, RCA 30p/m ISR, 99d (3.0x15 Resolute Onyx DES).   Cataract 02/04/2018   Cerebrovascular accident (CVA) (HCC) 03/18/2023   dysarthria   Chronic bronchitis (HCC)    secondary to cigarette smoking   FHx: allergies    Goiter    Granulomatous disease (HCC)    Hernia    Kidney stone on left side 2013   NSTEMI (non-ST elevated myocardial infarction) (HCC) 03/23/2020   Oxygen  deficiency    Panic attacks    PVC's (premature ventricular contractions)    a. 03/2018 Zio: Occas PVCs (2.5%). Triggered events assoc w/ PVC/PAC.   Retinal detachment, left 03/04/2020   Retinal tear 2020   Stroke (HCC) 10/29/2016   mild left side weakness   Stroke (HCC) 08/01/2019   Tobacco abuse    Past Surgical History:  Procedure Laterality Date   ABDOMINAL HYSTERECTOMY     BREAST SURGERY     CATARACT EXTRACTION W/PHACO Right 03/01/2017   Procedure: CATARACT EXTRACTION PHACO AND INTRAOCULAR LENS PLACEMENT (IOC) RIGHT DIABETIC;  Surgeon: Mittie Gaskin, MD;  Location: [facility];  Service: Ophthalmology;  Laterality: Right;   CATARACT EXTRACTION W/PHACO Left  03/22/2017   Procedure: CATARACT EXTRACTION PHACO AND INTRAOCULAR LENS PLACEMENT (IOC) LEFT DIABETIC;  Surgeon: Mittie Gaskin, MD;  Location: [facility];  Service: Ophthalmology;  Laterality: Left;  Diabetic - insulin  and oral meds   COLONOSCOPY WITH PROPOFOL  N/A 09/23/2014   Procedure: COLONOSCOPY WITH PROPOFOL ;  Surgeon: Gladis RAYMOND Mariner, MD;  Location: [facility] ENDOSCOPY;  Service: Endoscopy;  Laterality: N/A;   COLONOSCOPY WITH PROPOFOL  N/A 01/11/2018   Procedure: COLONOSCOPY WITH PROPOFOL ;  Surgeon: Mariner Gladis RAYMOND, MD;  Location: [facility] ENDOSCOPY;  Service: Endoscopy;  Laterality: N/A;   COLONOSCOPY WITH PROPOFOL  N/A 04/24/2018   Procedure: COLONOSCOPY WITH PROPOFOL ;  Surgeon: Mariner Gladis RAYMOND, MD;  Location: [facility] ENDOSCOPY;  Service: Endoscopy;  Laterality: N/A;   COLONOSCOPY WITH PROPOFOL  N/A 11/19/2019   Procedure: COLONOSCOPY WITH PROPOFOL ;  Surgeon: Jinny Carmine, MD;  Location: [facility] ENDOSCOPY;  Service: Endoscopy;  Laterality: N/A;   CORONARY ANGIOPLASTY WITH STENT PLACEMENT  2008   CORONARY STENT INTERVENTION N/A 03/24/2020   Procedure: CORONARY STENT INTERVENTION;  Surgeon: Mady Bruckner, MD;  Location: [facility] INVASIVE CV LAB;  Service: Cardiovascular;  Laterality: N/A;   ESOPHAGOGASTRODUODENOSCOPY (EGD) WITH PROPOFOL  N/A 12/30/2014   Procedure: ESOPHAGOGASTRODUODENOSCOPY (EGD) WITH PROPOFOL ;  Surgeon: Gladis RAYMOND Mariner, MD;  Location: [facility] ENDOSCOPY;  Service: Endoscopy;  Laterality: N/A;   ESOPHAGOGASTRODUODENOSCOPY (EGD) WITH PROPOFOL  N/A 07/19/2016  Procedure: ESOPHAGOGASTRODUODENOSCOPY (EGD) WITH PROPOFOL ;  Surgeon: Gaylyn Gladis PENNER, MD;  Location: [facility] ENDOSCOPY;  Service: Endoscopy;  Laterality: N/A;   ESOPHAGOGASTRODUODENOSCOPY (EGD) WITH PROPOFOL  N/A 04/24/2018   Procedure: ESOPHAGOGASTRODUODENOSCOPY (EGD) WITH PROPOFOL ;  Surgeon: Gaylyn Gladis PENNER, MD;  Location: [facility] ENDOSCOPY;  Service: Endoscopy;  Laterality: N/A;   EYE SURGERY  cataracts, detached  retina   hysterectomy (other)     LEFT HEART CATH AND CORONARY ANGIOGRAPHY N/A 03/24/2020   Procedure: LEFT HEART CATH AND CORONARY ANGIOGRAPHY;  Surgeon: Mady Bruckner, MD;  Location: [facility] INVASIVE CV LAB;  Service: Cardiovascular;  Laterality: N/A;   Patient Active Problem List   Diagnosis Date Noted   Stroke (HCC) 08/05/2023   Depression with anxiety 08/05/2023   Overweight (BMI 25.0-29.9) 08/05/2023   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) 03/17/2023   Dizziness 02/23/2023   Restrictive airway disease 07/21/2020   Partial thickness rotator cuff tear 03/20/2020   Shoulder pain 03/05/2020   Ataxia 02/28/2020   Difficulty walking 02/28/2020   Physical deconditioning 02/28/2020   Chronic diastolic congestive heart failure (HCC) 11/20/2019   Polyp of ascending colon    Hypokalemia 08/08/2019   TIA (transient ischemic attack) 08/01/2019   Benign hypertensive kidney disease with chronic kidney disease 04/25/2019   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   Chronic, continuous use of opioids 03/26/2018   History of adenomatous polyp of colon 01/22/2018   Diverticulosis of colon 01/22/2018   History of CVA (cerebrovascular accident) 11/08/2016   Weakness of left upper extremity 10/30/2016   AAA (abdominal aortic aneurysm) without rupture (HCC) 08/12/2016   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   Stage 3b chronic kidney disease (HCC) 07/27/2015   Type 2 diabetes mellitus with diabetic chronic kidney disease (HCC) 10/28/2014   Solitary pulmonary nodule on lung CT 08/20/2014   Allergic rhinitis 07/28/2014   Acid reflux 07/28/2014   Chronic obstructive pulmonary disease (HCC) 07/28/2014   Granuloma annulare    Back pain 11/12/2013   Lumbar scoliosis 10/30/2013   Lumbar radiculopathy 10/30/2013   Lumbar canal stenosis 10/30/2013   Proteinuria 10/22/2013   Smokes with greater than 30 pack year history 03/21/2011   Edema 08/18/2010    Hyperlipidemia 03/25/2009   Coronary artery disease 03/25/2009   Primary hypertension 03/25/2009    ONSET DATE: 08/05/23  REFERRING DIAG:  Z86.73 (ICD-10-CM) - History of CVA (cerebrovascular accident)  R27.0 (ICD-10-CM) - Ataxia    THERAPY DIAG:  Abnormality of gait and mobility  Difficulty in walking, not elsewhere classified  Muscle weakness (generalized)  Other abnormalities of gait and mobility  Unsteadiness on feet  Rationale for Evaluation and Treatment: Rehabilitation  SUBJECTIVE:  SUBJECTIVE STATEMENT:  Pt reports she is doing well.  Denies any falls since the last visit.    Pt accompanied by: self and family member, daughter  PERTINENT HISTORY:  PMH: of CVA, COPD, Dysarthria, Ataxia, TIA, T2DM, Back pain, scoliosis   PAIN:  Are you having pain? Yes: NPRS scale: 3/10 Pain location: low back and L hip Pain description: achy Aggravating factors: just hurts all the time. Relieving factors: hydrocodone  at night  PRECAUTIONS: Other: Pt has a looper to see if pt has a-fib  RED FLAGS: Abdominal aneurysm: Yes: pt reports having an abdominal aneurism as found by Dr. Marea.   WEIGHT BEARING RESTRICTIONS: No  FALLS: Has patient fallen in last 6 months? Yes. Number of falls 1.  Pt reports losing balance, and got caught up in cords that were on the floor when she was trying to close the blinds.  LIVING ENVIRONMENT: Lives with: lives with their spouse Lives in: House/apartment Stairs: Yes: External: 1 steps; none Has following equipment at home: Single point cane, Walker - 2 wheeled, Environmental consultant - 4 wheeled, shower chair, and Grab bars  PLOF: Independent  PATIENT GOALS: Pt is wanting to gain independence.  Pt is wanting to improve endurance levels and improving speech with  SLP.  OBJECTIVE:  Note: Objective measures were completed at Evaluation unless otherwise noted.  DIAGNOSTIC FINDINGS:   EXAM: MRI HEAD WITHOUT CONTRAST   MRA HEAD WITHOUT CONTRAST   MRA NECK WITHOUT AND WITH CONTRAST   IMPRESSION: 1. Small acute infarcts in the left frontal lobe, right parietal lobe, and left cerebellum. 2. Extensive chronic small vessel ischemic disease. 3. 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 4. Unchanged severe right A3 stenosis or segmental occlusion. 5. Moderate left P2 stenosis. 6. Limited assessment of the left vertebral artery origin, otherwise negative neck MRA.   COGNITION: Overall cognitive status: Within functional limits for tasks assessed   SENSATION: WFL, however pt notes having some numbness in the R hand.  COORDINATION: WFL  POSTURE: rounded shoulders, forward head, decreased lumbar lordosis, increased thoracic kyphosis, posterior pelvic tilt, and flexed trunk    LOWER EXTREMITY MMT:    MMT Right Eval Left Eval  Hip flexion 4 4  Hip extension    Hip abduction 4 4-  Hip adduction 4 4-  Hip internal rotation    Hip external rotation    Knee flexion 4 4-  Knee extension 4 4-  Ankle dorsiflexion 4 4-  Ankle plantarflexion    Ankle inversion    Ankle eversion    (Blank rows = not tested)  BED MOBILITY:  Not tested  FUNCTIONAL TESTS:  5 times sit to stand: 24.36 sec Timed up and go (TUG): 23.94 sec with walker 2 minute walk test: TBD 10 meter walk test: 20.33 sec with walker Berg Balance Scale: TBD  PATIENT SURVEYS:   Stroke Impact Scale 16 (Copyrighted instrument, [facility])  In the past 2 weeks, how difficult was it to...  Rating Scale 5 = Not difficult at all 4 = A little difficult 3 = Somewhat difficult 2 = Very difficult 1 = Could not do at all  a. Dress the top part of your body? 4  b. Bathe yourself? 2  c. Get to the toilet on time? 5  d. Control your bladder (not  have an accident)? 5  e. Control your bowels (not have an accident)? 5  f. Stand without losing balance? 3  g. Go shopping? 2  h. Do heavy household chores (e.g. vacuum, laundry or  yard work)? 1  i. Stay sitting without losing your balance? 3  j. Walk without losing your balance? 2  k. Move from a bed to a chair? 3  l. Walk fast? 1  m. Climb one flight of stairs? 1  n. Walk one block? 1  o. Get in and out of a car? 2  p. Carry heavy objects (e.g. bag of groceries) with your  affected hand? 1  Sum:  41/80  MDC (Minimal Detectable Change) is >/=8                                                                                                                                 TREATMENT DATE: 10/25/23   SpO2: 95% upon arrival to the clinic HR: 71 bpm  TherEx: To improve strength, endurance, mobility, and function of specific targeted muscle groups or improve joint range of motion or improve muscle flexibility  Seated LAQ Seated hamstring curls, GTB, 2x10 each LE Seated resisted marching, GTB, 2x10 each LE Seated resisted hip abduction, GTB, 2x10 each  LE Seated hip adduction into physioball, 2x10   TherAct: To improve functional movements patterns for everyday tasks  STS's, x10 with verbal cues for proper form  1st bout: Ambulation around the gym with use of RW and supplemental O2 (2L), x2 full laps for endurance training  2nd bout: Ambulation around the gym with use of RW and supplemental O2 (2L), x2 full laps for endurance training   PATIENT EDUCATION: Education details: Pt educated on role of PT and services provided during current POC, along with prognosis and information about the clinic.  Person educated: Patient and Child(ren) Education method: Explanation Education comprehension: verbalized understanding and returned demonstration  HOME EXERCISE PROGRAM:  Access Code: [email or web address] URL: [email or web address] Date: 10/17/2023 Prepared by: Sidra Simpers  Exercises - Standing March with Counter Support  - 1 x daily - 3-4 x weekly - 3 sets - 10 reps - Standing Knee Flexion with Unilateral Counter Support  - 1 x daily - 7 x weekly - 3 sets - 10 reps - Standing Hip Extension with Unilateral Counter Support  - 1 x daily - 7 x weekly - 3 sets - 10 reps - Standing Hip Abduction with Unilateral Counter Support  - 1 x daily - 7 x weekly - 3 sets - 10 reps - Heel Toe Raises with Unilateral Counter Support  - 1 x daily - 7 x weekly - 3 sets - 10 reps  GOALS: Goals reviewed with patient? Yes  SHORT TERM GOALS: Target date: 11/15/2023  Pt will be independent with HEP in order to demonstrate increased ability to perform tasks related to occupation/hobbies. Baseline: pt given HEP at eval Goal status: INITIAL  LONG TERM GOALS: Target date: 01/10/2024  1.  Patient (> 82 years old) will complete five times sit to stand test in < 15 seconds indicating  an increased LE strength and improved balance. Baseline: 24.36 sec Goal status: INITIAL  2.  Patient will improve SIS 16 score to 55   to demonstrate statistically significant improvement in mobility and quality of life as it relates to their CVA.  Baseline: 41 Goal status: INITIAL   3.  Patient will increase Berg Balance score by > 6 points to demonstrate decreased fall risk during functional activities. Baseline: 33/56 Goal status: INITIAL   4.  Patient will reduce timed up and go to <11 seconds to reduce fall risk and demonstrate improved transfer/gait ability. Baseline: 23.94 sec with walker Goal status: INITIAL  5.  Patient will increase 10 meter walk test to >1.1m/s as to improve gait speed for better community ambulation and to reduce fall risk. Baseline: 20.33 sec with walker Goal status: INITIAL  6.  Patient will increase 2 minute walk test distance by 50 ft or greater for progression to community ambulator and demonstrate improved gait ability  Baseline: 275'; 83.82 m Goal  status: INITIAL     ASSESSMENT:  CLINICAL IMPRESSION:  Pt continues to perform well, however limited by O2 saturation levels.  Pt requires consistent monitoring, however is able to tolerate seated exercises without the need for supplemental O2.  When performing sit to stands and ambulation, pt requires the O2 due to the increased oxygen  demand with those activities.  Pt will continue to be monitored and improve overall endurance and cardiovascular performance.   Pt will continue to benefit from skilled therapy to address remaining deficits in order to improve overall QoL and return to PLOF.       OBJECTIVE IMPAIRMENTS: Abnormal gait, decreased activity tolerance, decreased balance, decreased endurance, decreased knowledge of use of DME, decreased mobility, difficulty walking, decreased strength, impaired sensation, and pain.   ACTIVITY LIMITATIONS: carrying, lifting, bending, standing, squatting, stairs, transfers, bathing, toileting, dressing, hygiene/grooming, and locomotion level  PARTICIPATION LIMITATIONS: meal prep, cleaning, laundry, community activity, and yard work  PERSONAL FACTORS: Age and 3+ comorbidities:  PMH: of CVA, COPD, Dysarthria, Ataxia, TIA, T2DM, Back pain, scoliosis are also affecting patient's functional outcome.   REHAB POTENTIAL: Good  CLINICAL DECISION MAKING: Evolving/moderate complexity  EVALUATION COMPLEXITY: Moderate  PLAN:  PT FREQUENCY: 2x/week  PT DURATION: 12 weeks  PLANNED INTERVENTIONS: 97750- Physical Performance Testing, 97110-Therapeutic exercises, 97530- Therapeutic activity, 97112- Neuromuscular re-education, 97535- Self Care, [id number]- Manual therapy, [phone]- Gait training, Balance training, and Vestibular training  PLAN FOR NEXT SESSION:   Monitor SPO2 and get portable oxygen  if necessary  Endurance, balance, strength ( L>R), administer HEP, gait training    Fonda Simpers, PT, DPT Physical Therapist - [facility]  [facility]  10/25/23, 9:16 AM

## 2023-10-26 ENCOUNTER — Ambulatory Visit

## 2023-10-26 ENCOUNTER — Encounter: Admitting: Occupational Therapy

## 2023-10-26 DIAGNOSIS — M6281 Muscle weakness (generalized): Secondary | ICD-10-CM | POA: Diagnosis not present

## 2023-10-26 DIAGNOSIS — R269 Unspecified abnormalities of gait and mobility: Secondary | ICD-10-CM

## 2023-10-26 DIAGNOSIS — R2689 Other abnormalities of gait and mobility: Secondary | ICD-10-CM

## 2023-10-26 DIAGNOSIS — R262 Difficulty in walking, not elsewhere classified: Secondary | ICD-10-CM

## 2023-10-26 DIAGNOSIS — R4701 Aphasia: Secondary | ICD-10-CM | POA: Diagnosis not present

## 2023-10-26 DIAGNOSIS — R2681 Unsteadiness on feet: Secondary | ICD-10-CM | POA: Diagnosis not present

## 2023-10-26 DIAGNOSIS — R482 Apraxia: Secondary | ICD-10-CM

## 2023-10-26 NOTE — Therapy (Signed)
 OUTPATIENT PHYSICAL THERAPY NEURO TREATMENT   Patient Name: Hannah Kirby MRN: [id number] DOB:06/07/1945, 78 y.o., female Today's Date: 10/26/2023   PCP: Gasper [name] BRAVO, MD REFERRING PROVIDER: Gasper [name] BRAVO, MD  END OF SESSION:  PT End of Session - 10/26/23 1325     Visit Number 4    Number of Visits 25    Date for PT Re-Evaluation 01/09/24    PT Start Time 1320    PT Stop Time 1400    PT Time Calculation (min) 40 min    Equipment Utilized During Treatment Gait belt;Oxygen     Activity Tolerance Patient tolerated treatment well    Behavior During Therapy WFL for tasks assessed/performed;Flat affect          Past Medical History:  Diagnosis Date   Acute hemorrhagic colitis 08/08/2019   Anemia    C. difficile colitis 08/09/2019   CAD (coronary artery disease)    a. 02/2006 PCI: BMS x 2 to RCA, cath o/w without significant coronary disease; b. nuclear stress test 07/2014: No ischemia/infarct; c. 11/2017 MV: no isch/infarct, EF 55-65%; d. 03/2020 NSTEMI/PCI: LM nl, LAD min irregs, RI 25, small, LCX nl, RCA 30p/m ISR, 99d (3.0x15 Resolute Onyx DES).   Cataract 02/04/2018   Cerebrovascular accident (CVA) (HCC) 03/18/2023   dysarthria   Chronic bronchitis (HCC)    secondary to cigarette smoking   FHx: allergies    Goiter    Granulomatous disease (HCC)    Hernia    Kidney stone on left side 2013   NSTEMI (non-ST elevated myocardial infarction) (HCC) 03/23/2020   Oxygen  deficiency    Panic attacks    PVC's (premature ventricular contractions)    a. 03/2018 Zio: Occas PVCs (2.5%). Triggered events assoc w/ PVC/PAC.   Retinal detachment, left 03/04/2020   Retinal tear 2020   Stroke (HCC) 10/29/2016   mild left side weakness   Stroke (HCC) 08/01/2019   Tobacco abuse    Past Surgical History:  Procedure Laterality Date   ABDOMINAL HYSTERECTOMY     BREAST SURGERY     CATARACT EXTRACTION W/PHACO Right 03/01/2017   Procedure: CATARACT EXTRACTION PHACO AND INTRAOCULAR  LENS PLACEMENT (IOC) RIGHT DIABETIC;  Surgeon: Mittie Gaskin, MD;  Location: [facility];  Service: Ophthalmology;  Laterality: Right;   CATARACT EXTRACTION W/PHACO Left 03/22/2017   Procedure: CATARACT EXTRACTION PHACO AND INTRAOCULAR LENS PLACEMENT (IOC) LEFT DIABETIC;  Surgeon: Mittie Gaskin, MD;  Location: [facility];  Service: Ophthalmology;  Laterality: Left;  Diabetic - insulin  and oral meds   COLONOSCOPY WITH PROPOFOL  N/A 09/23/2014   Procedure: COLONOSCOPY WITH PROPOFOL ;  Surgeon: Gladis RAYMOND Mariner, MD;  Location: [facility] ENDOSCOPY;  Service: Endoscopy;  Laterality: N/A;   COLONOSCOPY WITH PROPOFOL  N/A 01/11/2018   Procedure: COLONOSCOPY WITH PROPOFOL ;  Surgeon: Mariner Gladis RAYMOND, MD;  Location: [facility] ENDOSCOPY;  Service: Endoscopy;  Laterality: N/A;   COLONOSCOPY WITH PROPOFOL  N/A 04/24/2018   Procedure: COLONOSCOPY WITH PROPOFOL ;  Surgeon: Mariner Gladis RAYMOND, MD;  Location: [facility] ENDOSCOPY;  Service: Endoscopy;  Laterality: N/A;   COLONOSCOPY WITH PROPOFOL  N/A 11/19/2019   Procedure: COLONOSCOPY WITH PROPOFOL ;  Surgeon: Jinny Carmine, MD;  Location: [facility] ENDOSCOPY;  Service: Endoscopy;  Laterality: N/A;   CORONARY ANGIOPLASTY WITH STENT PLACEMENT  2008   CORONARY STENT INTERVENTION N/A 03/24/2020   Procedure: CORONARY STENT INTERVENTION;  Surgeon: Mady Bruckner, MD;  Location: [facility] INVASIVE CV LAB;  Service: Cardiovascular;  Laterality: N/A;   ESOPHAGOGASTRODUODENOSCOPY (EGD) WITH PROPOFOL  N/A 12/30/2014   Procedure: ESOPHAGOGASTRODUODENOSCOPY (  EGD) WITH PROPOFOL ;  Surgeon: Gladis RAYMOND Mariner, MD;  Location: [facility] ENDOSCOPY;  Service: Endoscopy;  Laterality: N/A;   ESOPHAGOGASTRODUODENOSCOPY (EGD) WITH PROPOFOL  N/A 07/19/2016   Procedure: ESOPHAGOGASTRODUODENOSCOPY (EGD) WITH PROPOFOL ;  Surgeon: Mariner Gladis RAYMOND, MD;  Location: [facility] ENDOSCOPY;  Service: Endoscopy;  Laterality: N/A;   ESOPHAGOGASTRODUODENOSCOPY (EGD) WITH PROPOFOL  N/A 04/24/2018   Procedure:  ESOPHAGOGASTRODUODENOSCOPY (EGD) WITH PROPOFOL ;  Surgeon: Mariner Gladis RAYMOND, MD;  Location: [facility] ENDOSCOPY;  Service: Endoscopy;  Laterality: N/A;   EYE SURGERY  cataracts, detached retina   hysterectomy (other)     LEFT HEART CATH AND CORONARY ANGIOGRAPHY N/A 03/24/2020   Procedure: LEFT HEART CATH AND CORONARY ANGIOGRAPHY;  Surgeon: Mady Bruckner, MD;  Location: [facility] INVASIVE CV LAB;  Service: Cardiovascular;  Laterality: N/A;   Patient Active Problem List   Diagnosis Date Noted   Stroke (HCC) 08/05/2023   Depression with anxiety 08/05/2023   Overweight (BMI 25.0-29.9) 08/05/2023   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) 03/17/2023   Dizziness 02/23/2023   Restrictive airway disease 07/21/2020   Partial thickness rotator cuff tear 03/20/2020   Shoulder pain 03/05/2020   Ataxia 02/28/2020   Difficulty walking 02/28/2020   Physical deconditioning 02/28/2020   Chronic diastolic congestive heart failure (HCC) 11/20/2019   Polyp of ascending colon    Hypokalemia 08/08/2019   TIA (transient ischemic attack) 08/01/2019   Benign hypertensive kidney disease with chronic kidney disease 04/25/2019   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   Chronic, continuous use of opioids 03/26/2018   History of adenomatous polyp of colon 01/22/2018   Diverticulosis of colon 01/22/2018   History of CVA (cerebrovascular accident) 11/08/2016   Weakness of left upper extremity 10/30/2016   AAA (abdominal aortic aneurysm) without rupture (HCC) 08/12/2016   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   Stage 3b chronic kidney disease (HCC) 07/27/2015   Type 2 diabetes mellitus with diabetic chronic kidney disease (HCC) 10/28/2014   Solitary pulmonary nodule on lung CT 08/20/2014   Allergic rhinitis 07/28/2014   Acid reflux 07/28/2014   Chronic obstructive pulmonary disease (HCC) 07/28/2014   Granuloma annulare    Back pain 11/12/2013   Lumbar scoliosis  10/30/2013   Lumbar radiculopathy 10/30/2013   Lumbar canal stenosis 10/30/2013   Proteinuria 10/22/2013   Smokes with greater than 30 pack year history 03/21/2011   Edema 08/18/2010   Hyperlipidemia 03/25/2009   Coronary artery disease 03/25/2009   Primary hypertension 03/25/2009    ONSET DATE: 08/05/23  REFERRING DIAG:  Z86.73 (ICD-10-CM) - History of CVA (cerebrovascular accident)  R27.0 (ICD-10-CM) - Ataxia    THERAPY DIAG:  Abnormality of gait and mobility  Difficulty in walking, not elsewhere classified  Muscle weakness (generalized)  Other abnormalities of gait and mobility  Unsteadiness on feet  Rationale for Evaluation and Treatment: Rehabilitation  SUBJECTIVE:  SUBJECTIVE STATEMENT:  Pt reports she has been to the hairdresser already this morning and to [facility] to get a burger.      Pt accompanied by: self and family member, daughter  PERTINENT HISTORY:  PMH: of CVA, COPD, Dysarthria, Ataxia, TIA, T2DM, Back pain, scoliosis   PAIN:  Are you having pain? Yes: NPRS scale: 3/10 Pain location: low back and L hip Pain description: achy Aggravating factors: just hurts all the time. Relieving factors: hydrocodone  at night  PRECAUTIONS: Other: Pt has a looper to see if pt has a-fib  RED FLAGS: Abdominal aneurysm: Yes: pt reports having an abdominal aneurism as found by Dr. Marea.   WEIGHT BEARING RESTRICTIONS: No  FALLS: Has patient fallen in last 6 months? Yes. Number of falls 1.  Pt reports losing balance, and got caught up in cords that were on the floor when she was trying to close the blinds.  LIVING ENVIRONMENT: Lives with: lives with their spouse Lives in: House/apartment Stairs: Yes: External: 1 steps; none Has following equipment at home: Single point cane, Walker  - 2 wheeled, Environmental consultant - 4 wheeled, shower chair, and Grab bars  PLOF: Independent  PATIENT GOALS: Pt is wanting to gain independence.  Pt is wanting to improve endurance levels and improving speech with SLP.  OBJECTIVE:  Note: Objective measures were completed at Evaluation unless otherwise noted.  DIAGNOSTIC FINDINGS:   EXAM: MRI HEAD WITHOUT CONTRAST   MRA HEAD WITHOUT CONTRAST   MRA NECK WITHOUT AND WITH CONTRAST   IMPRESSION: 1. Small acute infarcts in the left frontal lobe, right parietal lobe, and left cerebellum. 2. Extensive chronic small vessel ischemic disease. 3. 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 4. Unchanged severe right A3 stenosis or segmental occlusion. 5. Moderate left P2 stenosis. 6. Limited assessment of the left vertebral artery origin, otherwise negative neck MRA.   COGNITION: Overall cognitive status: Within functional limits for tasks assessed   SENSATION: WFL, however pt notes having some numbness in the R hand.  COORDINATION: WFL  POSTURE: rounded shoulders, forward head, decreased lumbar lordosis, increased thoracic kyphosis, posterior pelvic tilt, and flexed trunk    LOWER EXTREMITY MMT:    MMT Right Eval Left Eval  Hip flexion 4 4  Hip extension    Hip abduction 4 4-  Hip adduction 4 4-  Hip internal rotation    Hip external rotation    Knee flexion 4 4-  Knee extension 4 4-  Ankle dorsiflexion 4 4-  Ankle plantarflexion    Ankle inversion    Ankle eversion    (Blank rows = not tested)  BED MOBILITY:  Not tested  FUNCTIONAL TESTS:  5 times sit to stand: 24.36 sec Timed up and go (TUG): 23.94 sec with walker 2 minute walk test: TBD 10 meter walk test: 20.33 sec with walker Berg Balance Scale: TBD  PATIENT SURVEYS:   Stroke Impact Scale 16 (Copyrighted instrument, [facility])  In the past 2 weeks, how difficult was it to...  Rating Scale 5 = Not difficult at all 4 = A little  difficult 3 = Somewhat difficult 2 = Very difficult 1 = Could not do at all  a. Dress the top part of your body? 4  b. Bathe yourself? 2  c. Get to the toilet on time? 5  d. Control your bladder (not have an accident)? 5  e. Control your bowels (not have an accident)? 5  f. Stand without losing balance?  3  g. Go shopping? 2  h. Do heavy household chores (e.g. vacuum, laundry or  yard work)? 1  i. Stay sitting without losing your balance? 3  j. Walk without losing your balance? 2  k. Move from a bed to a chair? 3  l. Walk fast? 1  m. Climb one flight of stairs? 1  n. Walk one block? 1  o. Get in and out of a car? 2  p. Carry heavy objects (e.g. bag of groceries) with your  affected hand? 1  Sum:  41/80  MDC (Minimal Detectable Change) is >/=8                                                                                                                                 TREATMENT DATE: 10/26/23   SpO2: 87% upon arrival to the clinic with room air HR: 75 bpm  TherEx: To improve strength, endurance, mobility, and function of specific targeted muscle groups or improve joint range of motion or improve muscle flexibility  Seated LAQ, no weight added, 2x10  Seated hamstring curls, GTB, 2x10 each LE Seated resisted marching, GTB, 2x10 each LE Seated resisted hip abduction, GTB, 2x10 each  LE Seated hip adduction into physioball, 2x10 Seated hamstring curls with blue physioball under feet, 2x10, very challenging for the pt to perform specifically on the L LE Seated hamstring curls with L LE only placed on the blue physioball, x10 with minA from therapist to keep LE on the ball and able to roll better   TherAct: To improve functional movements patterns for everyday tasks  STS's, first 4 using UE's on the handrails, remaining 6 utilizing hands on thighs and minA from therapist  Ambulation around the gym with use of RW and supplemental O2 (2L), x3 full laps for endurance training  and then moving to OT appointment    PATIENT EDUCATION: Education details: Pt educated on role of PT and services provided during current POC, along with prognosis and information about the clinic.  Person educated: Patient and Child(ren) Education method: Explanation Education comprehension: verbalized understanding and returned demonstration  HOME EXERCISE PROGRAM:  Access Code: [email or web address] URL: [email or web address] Date: 10/17/2023 Prepared by: Sidra Simpers  Exercises - Standing March with Counter Support  - 1 x daily - 3-4 x weekly - 3 sets - 10 reps - Standing Knee Flexion with Unilateral Counter Support  - 1 x daily - 7 x weekly - 3 sets - 10 reps - Standing Hip Extension with Unilateral Counter Support  - 1 x daily - 7 x weekly - 3 sets - 10 reps - Standing Hip Abduction with Unilateral Counter Support  - 1 x daily - 7 x weekly - 3 sets - 10 reps - Heel Toe Raises with Unilateral Counter Support  - 1 x daily - 7 x weekly - 3 sets - 10 reps  GOALS: Goals reviewed with patient? Yes  SHORT TERM GOALS: Target  date: 11/15/2023  Pt will be independent with HEP in order to demonstrate increased ability to perform tasks related to occupation/hobbies. Baseline: pt given HEP at eval Goal status: INITIAL  LONG TERM GOALS: Target date: 01/10/2024  1.  Patient (> 64 years old) will complete five times sit to stand test in < 15 seconds indicating an increased LE strength and improved balance. Baseline: 24.36 sec Goal status: INITIAL  2.  Patient will improve SIS 16 score to 55   to demonstrate statistically significant improvement in mobility and quality of life as it relates to their CVA.  Baseline: 41 Goal status: INITIAL   3.  Patient will increase Berg Balance score by > 6 points to demonstrate decreased fall risk during functional activities. Baseline: 33/56 Goal status: INITIAL   4.  Patient will reduce timed up and go to <11 seconds to reduce fall risk and  demonstrate improved transfer/gait ability. Baseline: 23.94 sec with walker Goal status: INITIAL  5.  Patient will increase 10 meter walk test to >1.30m/s as to improve gait speed for better community ambulation and to reduce fall risk. Baseline: 20.33 sec with walker Goal status: INITIAL  6.  Patient will increase 2 minute walk test distance by 50 ft or greater for progression to community ambulator and demonstrate improved gait ability  Baseline: 275'; 83.82 m Goal status: INITIAL     ASSESSMENT:  CLINICAL IMPRESSION:  Pt continues to be given education on pursed lip breathing technique and is able to manage O2 with seated exercises, however when performing more dynamic tasks including the STS's and ambulation, pt requires the supplemental O2.  Pt utilized 1L of O2 today to maintain SpO2 >90%.  Pt did demonstrate improved endurance with ambulation completing 3 total laps around the gym before needing to rest at OT as part of hand off.   Pt will continue to benefit from skilled therapy to address remaining deficits in order to improve overall QoL and return to PLOF.        OBJECTIVE IMPAIRMENTS: Abnormal gait, decreased activity tolerance, decreased balance, decreased endurance, decreased knowledge of use of DME, decreased mobility, difficulty walking, decreased strength, impaired sensation, and pain.   ACTIVITY LIMITATIONS: carrying, lifting, bending, standing, squatting, stairs, transfers, bathing, toileting, dressing, hygiene/grooming, and locomotion level  PARTICIPATION LIMITATIONS: meal prep, cleaning, laundry, community activity, and yard work  PERSONAL FACTORS: Age and 3+ comorbidities:  PMH: of CVA, COPD, Dysarthria, Ataxia, TIA, T2DM, Back pain, scoliosis are also affecting patient's functional outcome.   REHAB POTENTIAL: Good  CLINICAL DECISION MAKING: Evolving/moderate complexity  EVALUATION COMPLEXITY: Moderate  PLAN:  PT FREQUENCY: 2x/week  PT DURATION: 12  weeks  PLANNED INTERVENTIONS: 97750- Physical Performance Testing, 97110-Therapeutic exercises, 97530- Therapeutic activity, 97112- Neuromuscular re-education, 97535- Self Care, [id number]- Manual therapy, [phone]- Gait training, Balance training, and Vestibular training  PLAN FOR NEXT SESSION:   Monitor SPO2 and get portable oxygen  if necessary  Endurance, balance, strength ( L>R), updated HEP, gait training    Fonda Simpers, PT, DPT Physical Therapist - [facility]  [facility]  10/26/23, 1:26 PM

## 2023-10-26 NOTE — Therapy (Signed)
 OUTPATIENT SPEECH LANGUAGE PATHOLOGY APHASIA TREATMENT   Patient Name: Hannah Kirby MRN: [id number] DOB:1945/07/07, 78 y.o., female Today's Date: 10/26/2023  PCP: [name] Perry, MD REFERRING PROVIDER: [name] Perry, MD   End of Session - 10/26/23 1345     Visit Number 4    Number of Visits 23    Date for SLP Re-Evaluation 01/09/24    Progress Note Due on Visit 10    SLP Start Time 1400    SLP Stop Time  1445    SLP Time Calculation (min) 45 min    Activity Tolerance Patient tolerated treatment well          Past Medical History:  Diagnosis Date   Acute hemorrhagic colitis 08/08/2019   Anemia    C. difficile colitis 08/09/2019   CAD (coronary artery disease)    a. 02/2006 PCI: BMS x 2 to RCA, cath o/w without significant coronary disease; b. nuclear stress test 07/2014: No ischemia/infarct; c. 11/2017 MV: no isch/infarct, EF 55-65%; d. 03/2020 NSTEMI/PCI: LM nl, LAD min irregs, RI 25, small, LCX nl, RCA 30p/m ISR, 99d (3.0x15 Resolute Onyx DES).   Cataract 02/04/2018   Cerebrovascular accident (CVA) (HCC) 03/18/2023   dysarthria   Chronic bronchitis (HCC)    secondary to cigarette smoking   FHx: allergies    Goiter    Granulomatous disease (HCC)    Hernia    Kidney stone on left side 2013   NSTEMI (non-ST elevated myocardial infarction) (HCC) 03/23/2020   Oxygen  deficiency    Panic attacks    PVC's (premature ventricular contractions)    a. 03/2018 Zio: Occas PVCs (2.5%). Triggered events assoc w/ PVC/PAC.   Retinal detachment, left 03/04/2020   Retinal tear 2020   Stroke (HCC) 10/29/2016   mild left side weakness   Stroke (HCC) 08/01/2019   Tobacco abuse    Past Surgical History:  Procedure Laterality Date   ABDOMINAL HYSTERECTOMY     BREAST SURGERY     CATARACT EXTRACTION W/PHACO Right 03/01/2017   Procedure: CATARACT EXTRACTION PHACO AND INTRAOCULAR LENS PLACEMENT (IOC) RIGHT DIABETIC;  Surgeon: Mittie Gaskin, MD;  Location: [facility];   Service: Ophthalmology;  Laterality: Right;   CATARACT EXTRACTION W/PHACO Left 03/22/2017   Procedure: CATARACT EXTRACTION PHACO AND INTRAOCULAR LENS PLACEMENT (IOC) LEFT DIABETIC;  Surgeon: Mittie Gaskin, MD;  Location: [facility];  Service: Ophthalmology;  Laterality: Left;  Diabetic - insulin  and oral meds   COLONOSCOPY WITH PROPOFOL  N/A 09/23/2014   Procedure: COLONOSCOPY WITH PROPOFOL ;  Surgeon: Gladis RAYMOND Mariner, MD;  Location: [facility] ENDOSCOPY;  Service: Endoscopy;  Laterality: N/A;   COLONOSCOPY WITH PROPOFOL  N/A 01/11/2018   Procedure: COLONOSCOPY WITH PROPOFOL ;  Surgeon: Mariner Gladis RAYMOND, MD;  Location: [facility] ENDOSCOPY;  Service: Endoscopy;  Laterality: N/A;   COLONOSCOPY WITH PROPOFOL  N/A 04/24/2018   Procedure: COLONOSCOPY WITH PROPOFOL ;  Surgeon: Mariner Gladis RAYMOND, MD;  Location: [facility] ENDOSCOPY;  Service: Endoscopy;  Laterality: N/A;   COLONOSCOPY WITH PROPOFOL  N/A 11/19/2019   Procedure: COLONOSCOPY WITH PROPOFOL ;  Surgeon: Jinny Carmine, MD;  Location: [facility] ENDOSCOPY;  Service: Endoscopy;  Laterality: N/A;   CORONARY ANGIOPLASTY WITH STENT PLACEMENT  2008   CORONARY STENT INTERVENTION N/A 03/24/2020   Procedure: CORONARY STENT INTERVENTION;  Surgeon: Mady Bruckner, MD;  Location: [facility] INVASIVE CV LAB;  Service: Cardiovascular;  Laterality: N/A;   ESOPHAGOGASTRODUODENOSCOPY (EGD) WITH PROPOFOL  N/A 12/30/2014   Procedure: ESOPHAGOGASTRODUODENOSCOPY (EGD) WITH PROPOFOL ;  Surgeon: Gladis RAYMOND Mariner, MD;  Location: [facility] ENDOSCOPY;  Service: Endoscopy;  Laterality: N/A;   ESOPHAGOGASTRODUODENOSCOPY (EGD) WITH PROPOFOL  N/A 07/19/2016   Procedure: ESOPHAGOGASTRODUODENOSCOPY (EGD) WITH PROPOFOL ;  Surgeon: Gaylyn Gladis PENNER, MD;  Location: [facility] ENDOSCOPY;  Service: Endoscopy;  Laterality: N/A;   ESOPHAGOGASTRODUODENOSCOPY (EGD) WITH PROPOFOL  N/A 04/24/2018   Procedure: ESOPHAGOGASTRODUODENOSCOPY (EGD) WITH PROPOFOL ;  Surgeon: Gaylyn Gladis PENNER, MD;  Location: [facility]  ENDOSCOPY;  Service: Endoscopy;  Laterality: N/A;   EYE SURGERY  cataracts, detached retina   hysterectomy (other)     LEFT HEART CATH AND CORONARY ANGIOGRAPHY N/A 03/24/2020   Procedure: LEFT HEART CATH AND CORONARY ANGIOGRAPHY;  Surgeon: Mady Bruckner, MD;  Location: [facility] INVASIVE CV LAB;  Service: Cardiovascular;  Laterality: N/A;   Patient Active Problem List   Diagnosis Date Noted   Stroke (HCC) 08/05/2023   Depression with anxiety 08/05/2023   Overweight (BMI 25.0-29.9) 08/05/2023   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) 03/17/2023   Dizziness 02/23/2023   Restrictive airway disease 07/21/2020   Partial thickness rotator cuff tear 03/20/2020   Shoulder pain 03/05/2020   Ataxia 02/28/2020   Difficulty walking 02/28/2020   Physical deconditioning 02/28/2020   Chronic diastolic congestive heart failure (HCC) 11/20/2019   Polyp of ascending colon    Hypokalemia 08/08/2019   TIA (transient ischemic attack) 08/01/2019   Benign hypertensive kidney disease with chronic kidney disease 04/25/2019   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   Chronic, continuous use of opioids 03/26/2018   History of adenomatous polyp of colon 01/22/2018   Diverticulosis of colon 01/22/2018   History of CVA (cerebrovascular accident) 11/08/2016   Weakness of left upper extremity 10/30/2016   AAA (abdominal aortic aneurysm) without rupture (HCC) 08/12/2016   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   Stage 3b chronic kidney disease (HCC) 07/27/2015   Type 2 diabetes mellitus with diabetic chronic kidney disease (HCC) 10/28/2014   Solitary pulmonary nodule on lung CT 08/20/2014   Allergic rhinitis 07/28/2014   Acid reflux 07/28/2014   Chronic obstructive pulmonary disease (HCC) 07/28/2014   Granuloma annulare    Back pain 11/12/2013   Lumbar scoliosis 10/30/2013   Lumbar radiculopathy 10/30/2013   Lumbar canal stenosis 10/30/2013   Proteinuria 10/22/2013    Smokes with greater than 30 pack year history 03/21/2011   Edema 08/18/2010   Hyperlipidemia 03/25/2009   Coronary artery disease 03/25/2009   Primary hypertension 03/25/2009    ONSET DATE: 03/17/23 (admitted with stroke), 05/23/23 (referral date)  REFERRING DIAG:  R41.841 (ICD-10-CM) - Cognitive communication deficit  I63.9 (ICD-10-CM) - CVA (cerebral vascular accident) (HCC)  R47.02 (ICD-10-CM) - Dysphasia    THERAPY DIAG:  Aphasia  Apraxia of speech  Rationale for Evaluation and Treatment Rehabilitation  SUBJECTIVE:   SUBJECTIVE STATEMENT: Pt alert, pleasant, and cooperative.  Pt accompanied by: self and family member  PERTINENT HISTORY:  Pt is a 78 y.o. female who presents for communication evaluation in setting on stroke. Pt in ED 1/24-1/25/25. Pt d/c'd home with HH ST. PMHx significant of   HTN, CKD stage IIIB, DMT2 on insulin , CAD, chronic smoking, chronic respiratory failure with COPD and on home oxygen , anxiety, HLD. MRI 03/17/23 1. Punctate foci of restricted diffusion in the left posterior  frontal white matter, most likely acute infarcts.  2. Numerous foci of hemosiderin deposition, which are seen in the  deep gray structures but also in the bilateral cerebral hemispheres,  with 1 new focus compared to 02/20/2023. 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   PAIN:  Are you having pain? No  FALLS: defer to PT  LIVING ENVIRONMENT: Lives with: lives with their spouse Lives in: House/apartment  PLOF:  Level of assistance: Independent with ADLs Employment: Retired   PATIENT GOALS    for communication to improve   OBJECTIVE:  TODAY'S TREATMENT:  Pt and daughter discussed isolating nature of aphasia/stroke and CLOF. Discussed aphasia groups ([facility], [place]) and [facility] stroke support group as well as the importance of being socially  connected.   Reading comprehension targeted with use of Talk[employer] Therapy app. Pt completed the following:   Describe the picture - Level 3 - 100% accuracy   Sentence completion - Level 2 - 100% accuracy   Pt benefited from extra time and re-reading of stimuli.    Writing targeted with pt verbalizing and writing short sentences with provided target noun with anywhere from min-max cues for spelling.   PATIENT EDUCATION: Education details: as above Person educated: Patient and Child Education method: Explanation Education comprehension: verbalized understanding and needs further education  HOME EXERCISE PROGRAM:    Play with SGD  Utilize to SGD for homework and for repetition    GOALS:  Goals reviewed with patient? Yes  SHORT TERM GOALS: Target date: 10 sessions  Pt will participate in further assessment of functional reading/writing. Baseline: Goal status: INITIAL  2.  With Moderate A, patient will complete a semantic feature analysis with at least 2 relevant features for 8/10 target words to improve word-finding skills.  Baseline:  Goal status: INITIAL  3.  With Maximal A, patient will generate sentences with 3 or more words in response to a situation at 80% accuracy in order to increase ability to communicate basic wants and needs.  Baseline:  Goal status: INITIAL  4.   With Maximal A,  pt will follow 2-step commands for improved participation in ADLs/iADLs with max cueing. Baseline:  Goal status: INITIAL  5.  Pt will repeat short sentences (<4 words) with good approximations 80% of the time with max cueing. Baseline:  Goal status: INITIAL    LONG TERM GOALS: Target date: 12 weeks  Pt will report a subjective improvement in communication per PROM.  Baseline: CES 12/32 on 8/26 Goal status: INITIAL  2.  With Min A, patient/family will demonstrate understanding of the following concepts: aphasia, spontaneous recovery, communication vs conversation,  strengths/strategies to promote success, local resources in order to increase patient's participation in medical care.    Baseline:  Goal status: INITIAL   ASSESSMENT:  CLINICAL IMPRESSION: Pt is a 78 y.o. female who presents for communication treatment in setting on stroke. Pt known to SLP services and was receiving OP ST prior to most recent stroke 08/05/23. Pt d/c'd home with [facility] ST who is working on securing SGD for pt. PMHx significant of  HTN, CKD stage IIIB, DMT2 on insulin , CAD, chronic smoking, chronic respiratory failure with COPD and on home oxygen , anxiety, HLD. Assessment this episode completed via functional/dynamic means including Western Aphasia Battery Revised (WAB-R) and PROM (Communication Effectiveness Scale). 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 See details of tx session above. Recommend course of ST targeting functional communication, further assessment of functional reading/writing, and pt/caregiver training to help promote QoL and overall life participation.   OBJECTIVE IMPAIRMENTS include expressive language, receptive language, and aphasia. These impairments are limiting patient from ADLs/IADLs and effectively communicating at home and in community.  Factors affecting potential to achieve goals and functional outcome are severity of impairments. Patient will benefit from skilled SLP services to address above impairments and improve overall function.  REHAB POTENTIAL: Good  PLAN: SLP FREQUENCY: 2x/week  SLP DURATION: 12 weeks  PLANNED INTERVENTIONS: Language facilitation, Cueing hierachy, Internal/external aids, Functional tasks, Multimodal communication approach, SLP instruction and feedback, Compensatory strategies, and Patient/family education    Delon Bangs, M.S., CCC-SLP Speech-Language Pathologist [place] - [facility] [phone] [place])  [facility] at [facility] [address] [place], [place], [address] Phone: [phone]   Fax:  [phone]

## 2023-10-30 ENCOUNTER — Ambulatory Visit

## 2023-10-30 ENCOUNTER — Ambulatory Visit: Admitting: Physical Therapy

## 2023-10-30 DIAGNOSIS — R262 Difficulty in walking, not elsewhere classified: Secondary | ICD-10-CM | POA: Diagnosis not present

## 2023-10-30 DIAGNOSIS — R482 Apraxia: Secondary | ICD-10-CM

## 2023-10-30 DIAGNOSIS — R269 Unspecified abnormalities of gait and mobility: Secondary | ICD-10-CM

## 2023-10-30 DIAGNOSIS — R2689 Other abnormalities of gait and mobility: Secondary | ICD-10-CM | POA: Diagnosis not present

## 2023-10-30 DIAGNOSIS — M6281 Muscle weakness (generalized): Secondary | ICD-10-CM

## 2023-10-30 DIAGNOSIS — R4701 Aphasia: Secondary | ICD-10-CM

## 2023-10-30 DIAGNOSIS — I69890 Apraxia following other cerebrovascular disease: Secondary | ICD-10-CM

## 2023-10-30 DIAGNOSIS — R278 Other lack of coordination: Secondary | ICD-10-CM

## 2023-10-30 DIAGNOSIS — R2681 Unsteadiness on feet: Secondary | ICD-10-CM | POA: Diagnosis not present

## 2023-10-30 NOTE — Therapy (Unsigned)
 OUTPATIENT OCCUPATIONAL THERAPY NEURO EVALUATION  Patient Name: Hannah Kirby MRN: [id number] DOB:Jul 06, 1945, 78 y.o., female Today's Date: 10/30/2023  PCP: Dr. [name] Perry REFERRING PROVIDER: ***  END OF SESSION:   Past Medical History:  Diagnosis Date   Acute hemorrhagic colitis 08/08/2019   Anemia    C. difficile colitis 08/09/2019   CAD (coronary artery disease)    a. 02/2006 PCI: BMS x 2 to RCA, cath o/w without significant coronary disease; b. nuclear stress test 07/2014: No ischemia/infarct; c. 11/2017 MV: no isch/infarct, EF 55-65%; d. 03/2020 NSTEMI/PCI: LM nl, LAD min irregs, RI 25, small, LCX nl, RCA 30p/m ISR, 99d (3.0x15 Resolute Onyx DES).   Cataract 02/04/2018   Cerebrovascular accident (CVA) (HCC) 03/18/2023   dysarthria   Chronic bronchitis (HCC)    secondary to cigarette smoking   FHx: allergies    Goiter    Granulomatous disease (HCC)    Hernia    Kidney stone on left side 2013   NSTEMI (non-ST elevated myocardial infarction) (HCC) 03/23/2020   Oxygen  deficiency    Panic attacks    PVC's (premature ventricular contractions)    a. 03/2018 Zio: Occas PVCs (2.5%). Triggered events assoc w/ PVC/PAC.   Retinal detachment, left 03/04/2020   Retinal tear 2020   Stroke (HCC) 10/29/2016   mild left side weakness   Stroke (HCC) 08/01/2019   Tobacco abuse    Past Surgical History:  Procedure Laterality Date   ABDOMINAL HYSTERECTOMY     BREAST SURGERY     CATARACT EXTRACTION W/PHACO Right 03/01/2017   Procedure: CATARACT EXTRACTION PHACO AND INTRAOCULAR LENS PLACEMENT (IOC) RIGHT DIABETIC;  Surgeon: Mittie Gaskin, MD;  Location: [facility];  Service: Ophthalmology;  Laterality: Right;   CATARACT EXTRACTION W/PHACO Left 03/22/2017   Procedure: CATARACT EXTRACTION PHACO AND INTRAOCULAR LENS PLACEMENT (IOC) LEFT DIABETIC;  Surgeon: Mittie Gaskin, MD;  Location: [facility];  Service: Ophthalmology;  Laterality: Left;  Diabetic -  insulin  and oral meds   COLONOSCOPY WITH PROPOFOL  N/A 09/23/2014   Procedure: COLONOSCOPY WITH PROPOFOL ;  Surgeon: Gladis RAYMOND Mariner, MD;  Location: [facility] ENDOSCOPY;  Service: Endoscopy;  Laterality: N/A;   COLONOSCOPY WITH PROPOFOL  N/A 01/11/2018   Procedure: COLONOSCOPY WITH PROPOFOL ;  Surgeon: Mariner Gladis RAYMOND, MD;  Location: [facility] ENDOSCOPY;  Service: Endoscopy;  Laterality: N/A;   COLONOSCOPY WITH PROPOFOL  N/A 04/24/2018   Procedure: COLONOSCOPY WITH PROPOFOL ;  Surgeon: Mariner Gladis RAYMOND, MD;  Location: [facility] ENDOSCOPY;  Service: Endoscopy;  Laterality: N/A;   COLONOSCOPY WITH PROPOFOL  N/A 11/19/2019   Procedure: COLONOSCOPY WITH PROPOFOL ;  Surgeon: Jinny Carmine, MD;  Location: [facility] ENDOSCOPY;  Service: Endoscopy;  Laterality: N/A;   CORONARY ANGIOPLASTY WITH STENT PLACEMENT  2008   CORONARY STENT INTERVENTION N/A 03/24/2020   Procedure: CORONARY STENT INTERVENTION;  Surgeon: Mady Bruckner, MD;  Location: [facility] INVASIVE CV LAB;  Service: Cardiovascular;  Laterality: N/A;   ESOPHAGOGASTRODUODENOSCOPY (EGD) WITH PROPOFOL  N/A 12/30/2014   Procedure: ESOPHAGOGASTRODUODENOSCOPY (EGD) WITH PROPOFOL ;  Surgeon: Gladis RAYMOND Mariner, MD;  Location: [facility] ENDOSCOPY;  Service: Endoscopy;  Laterality: N/A;   ESOPHAGOGASTRODUODENOSCOPY (EGD) WITH PROPOFOL  N/A 07/19/2016   Procedure: ESOPHAGOGASTRODUODENOSCOPY (EGD) WITH PROPOFOL ;  Surgeon: Mariner Gladis RAYMOND, MD;  Location: [facility] ENDOSCOPY;  Service: Endoscopy;  Laterality: N/A;   ESOPHAGOGASTRODUODENOSCOPY (EGD) WITH PROPOFOL  N/A 04/24/2018   Procedure: ESOPHAGOGASTRODUODENOSCOPY (EGD) WITH PROPOFOL ;  Surgeon: Mariner Gladis RAYMOND, MD;  Location: [facility] ENDOSCOPY;  Service: Endoscopy;  Laterality: N/A;   EYE SURGERY  cataracts, detached retina   hysterectomy (other)  LEFT HEART CATH AND CORONARY ANGIOGRAPHY N/A 03/24/2020   Procedure: LEFT HEART CATH AND CORONARY ANGIOGRAPHY;  Surgeon: Mady Bruckner, MD;  Location: [facility] INVASIVE CV LAB;  Service:  Cardiovascular;  Laterality: N/A;   Patient Active Problem List   Diagnosis Date Noted   Stroke (HCC) 08/05/2023   Depression with anxiety 08/05/2023   Overweight (BMI 25.0-29.9) 08/05/2023   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) 03/17/2023   Dizziness 02/23/2023   Restrictive airway disease 07/21/2020   Partial thickness rotator cuff tear 03/20/2020   Shoulder pain 03/05/2020   Ataxia 02/28/2020   Difficulty walking 02/28/2020   Physical deconditioning 02/28/2020   Chronic diastolic congestive heart failure (HCC) 11/20/2019   Polyp of ascending colon    Hypokalemia 08/08/2019   TIA (transient ischemic attack) 08/01/2019   Benign hypertensive kidney disease with chronic kidney disease 04/25/2019   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   Chronic, continuous use of opioids 03/26/2018   History of adenomatous polyp of colon 01/22/2018   Diverticulosis of colon 01/22/2018   History of CVA (cerebrovascular accident) 11/08/2016   Weakness of left upper extremity 10/30/2016   AAA (abdominal aortic aneurysm) without rupture (HCC) 08/12/2016   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   Stage 3b chronic kidney disease (HCC) 07/27/2015   Type 2 diabetes mellitus with diabetic chronic kidney disease (HCC) 10/28/2014   Solitary pulmonary nodule on lung CT 08/20/2014   Allergic rhinitis 07/28/2014   Acid reflux 07/28/2014   Chronic obstructive pulmonary disease (HCC) 07/28/2014   Granuloma annulare    Back pain 11/12/2013   Lumbar scoliosis 10/30/2013   Lumbar radiculopathy 10/30/2013   Lumbar canal stenosis 10/30/2013   Proteinuria 10/22/2013   Smokes with greater than 30 pack year history 03/21/2011   Edema 08/18/2010   Hyperlipidemia 03/25/2009   Coronary artery disease 03/25/2009   Primary hypertension 03/25/2009    ONSET DATE: 08/05/2023  REFERRING DIAG: ***  THERAPY DIAG:  Muscle weakness (generalized)  Other lack of  coordination  Rationale for Evaluation and Treatment: {HABREHAB:27488}  SUBJECTIVE:   SUBJECTIVE STATEMENT: *** Pt accompanied by: family member: daughter, Harrie BROCK HISTORY: 2018 and 2019 this is the 4th stroke, previous stroke in Jan  PRECAUTIONS: {Therapy precautions:24002}  WEIGHT BEARING RESTRICTIONS: {Yes ***/No:24003}  PAIN:  Are you having pain? Yes: NPRS scale: 3/10, can get up to 7-8/10 Pain location: low back and L hip Pain description: achy, sharp, dull Aggravating factors: prolonged standing  Relieving factors: sitting, pain meds, heat/ice   FALLS: Has patient fallen in last 6 months? {fallsyesno:27318}  LIVING ENVIRONMENT: Lives with: lives with their spouse Lives in: 1 level home Stairs: {opstairs:27293} Has following equipment at home: walk in shower, comfort height toilet   PLOF: Prior to Jan, pt was managing all of the home management tasks, able to Drive, caring for spouse   PATIENT GOALS: Strengthening the left arm   OBJECTIVE:  Note: Objective measures were completed at Evaluation unless otherwise noted.  HAND DOMINANCE: Right  ADLs: Overall ADLs: *** Transfers/ambulation related to ADLs: occasional help with using walker at home, depending on balance Eating: able to cut food  Grooming: bimanual grooming  UB Dressing: extra time for clothing fasteners, but able to gather clothing with RW LB Dressing: extra time with clothing fasteners Toileting: modified indep, occasional help to walk to the bathroom, but spouse does not have to enter the bathroom  Bathing: direct supv for bathing using walkin in shower and shower chair and grab bar;  gets hair washed weekly  Tub Shower transfers: *** Equipment: RW, SBQC, shower chair, grab bars,   IADLs: Shopping: daughter managing currently Light housekeeping: daughter currently managing; can start laundry on a good day.  Meal Prep: starting to participate in light meal prep and eggs  Community  mobility: daughter helping with all driving Medication management: daughter sets up pills 2 weeks at a time  Financial management: pt is participating in bill paying with daughter's assistance; most are online Handwriting: {OTWRITTENEXPRESSION:25361}  MOBILITY STATUS: {OTMOBILITY:25360}  POSTURE COMMENTS:  rounded shoulders, forward head, decreased lumbar lordosis, increased thoracic kyphosis, posterior pelvic tilt, and flexed trunk  Sitting balance: {sitting balance:25483}  ACTIVITY TOLERANCE: Activity tolerance: ***  FUNCTIONAL OUTCOME MEASURES: {OTFUNCTIONALMEASURES:27238}  UPPER EXTREMITY ROM:  hx of R rotator cuff tear/ no sx 4-5 years ago   Active ROM Right eval Left eval  Shoulder flexion 3+ 4  Shoulder abduction 3+ 4  Shoulder adduction    Shoulder extension    Shoulder internal rotation 3+ 4-  Shoulder external rotation 3+ 3+  Elbow flexion 5 5  Elbow extension 5 5  Wrist flexion 4+ 5  Wrist extension 4+ 5  Wrist ulnar deviation    Wrist radial deviation    Wrist pronation    Wrist supination    (Blank rows = not tested)  UPPER EXTREMITY MMT:     MMT Right eval Left eval  Shoulder flexion    Shoulder abduction    Shoulder adduction    Shoulder extension    Shoulder internal rotation    Shoulder external rotation    Middle trapezius    Lower trapezius    Elbow flexion    Elbow extension    Wrist flexion    Wrist extension    Wrist ulnar deviation    Wrist radial deviation    Wrist pronation    Wrist supination    (Blank rows = not tested)  HAND FUNCTION: Grip strength: Right: 32 lbs; Left: 30 lbs, Lateral pinch: Right: 8 lbs, Left: 6 lbs, and 3 point pinch: Right: 7 lbs, Left: 6 lbs  COORDINATION: Finger Nose Finger test: LUE mild ataxia  9 Hole Peg test: Right: 29 sec; Left: 47 sec  SENSATION: Pt reports intermittent numbness in the R thumb, IF, and MF  {sensation:27233}  EDEMA: ***  MUSCLE TONE: {UETONE:25567}  COGNITION: Overall  cognitive status: {cognition:24006}  VISION: L eye feels more blurry since first stroke in 2018; pt has readers but doesn't usually wear them  Subjective report: *** Baseline vision: {OTBASELINEVISION:25363} Visual history: {OTVISUALHISTORY:25364}  VISION ASSESSMENT: {visionassessment:27231}  Patient has difficulty with following activities due to following visual impairments: ***  PERCEPTION: {Perception:25564}  PRAXIS: {Praxis:25565}  OBSERVATIONS: ***                                                                                                                             TREATMENT DATE: ***  PATIENT EDUCATION: Education details: *** Person educated: {Person educated:25204} Education method: {Education Method:25205} Education comprehension: {Education Comprehension:25206}  HOME EXERCISE PROGRAM: ***   GOALS: Goals reviewed with patient? {yes/no:20286}  SHORT TERM GOALS: Target date: ***  *** Baseline: Goal status: INITIAL  2.  *** Baseline:  Goal status: INITIAL  3.  *** Baseline:  Goal status: INITIAL  4.  *** Baseline:  Goal status: INITIAL  5.  *** Baseline:  Goal status: INITIAL  6.  *** Baseline:  Goal status: INITIAL  LONG TERM GOALS: Target date: ***  *** Baseline:  Goal status: INITIAL  2.  *** Baseline:  Goal status: INITIAL  3.  *** Baseline:  Goal status: INITIAL  4.  *** Baseline:  Goal status: INITIAL  5.  *** Baseline:  Goal status: INITIAL  6.  *** Baseline:  Goal status: INITIAL  ASSESSMENT:  CLINICAL IMPRESSION: Patient is a *** y.o. *** who was seen today for occupational therapy evaluation for ***.   PERFORMANCE DEFICITS: in functional skills including {OT physical skills:25468}, cognitive skills including {OT cognitive skills:25469}, and psychosocial skills including {OT psychosocial skills:25470}.   IMPAIRMENTS: are limiting patient from {OT performance deficits:25471}.    CO-MORBIDITIES: {Comorbidities:25485} that affects occupational performance. Patient will benefit from skilled OT to address above impairments and improve overall function.  MODIFICATION OR ASSISTANCE TO COMPLETE EVALUATION: {OT modification:25474}  OT OCCUPATIONAL PROFILE AND HISTORY: {OT PROFILE AND HISTORY:25484}  CLINICAL DECISION MAKING: {OT CDM:25475}  REHAB POTENTIAL: {rehabpotential:25112}  EVALUATION COMPLEXITY: {Evaluation complexity:25115}    PLAN:  OT FREQUENCY: {rehab frequency:25116}  OT DURATION: {rehab duration:25117}  PLANNED INTERVENTIONS: {OT Interventions:25467}  RECOMMENDED OTHER SERVICES: ***  CONSULTED AND AGREED WITH PLAN OF CARE: {ENR:74513}  PLAN FOR NEXT SESSION: ***   Inocente MARLA Blazing, OT 10/30/2023, 2:52 PM

## 2023-10-30 NOTE — Therapy (Signed)
 OUTPATIENT SPEECH LANGUAGE PATHOLOGY APHASIA TREATMENT   Patient Name: Hannah Kirby MRN: [id number] DOB:11/28/1945, 78 y.o., female Today's Date: 10/30/2023  PCP: [name] Perry, MD REFERRING PROVIDER: [name] Perry, MD   End of Session - 10/30/23 1528     Visit Number 5    Number of Visits 23    Date for SLP Re-Evaluation 01/09/24    Progress Note Due on Visit 10    SLP Start Time 1530    SLP Stop Time  1615    SLP Time Calculation (min) 45 min    Activity Tolerance Patient tolerated treatment well          Past Medical History:  Diagnosis Date   Acute hemorrhagic colitis 08/08/2019   Anemia    C. difficile colitis 08/09/2019   CAD (coronary artery disease)    a. 02/2006 PCI: BMS x 2 to RCA, cath o/w without significant coronary disease; b. nuclear stress test 07/2014: No ischemia/infarct; c. 11/2017 MV: no isch/infarct, EF 55-65%; d. 03/2020 NSTEMI/PCI: LM nl, LAD min irregs, RI 25, small, LCX nl, RCA 30p/m ISR, 99d (3.0x15 Resolute Onyx DES).   Cataract 02/04/2018   Cerebrovascular accident (CVA) (HCC) 03/18/2023   dysarthria   Chronic bronchitis (HCC)    secondary to cigarette smoking   FHx: allergies    Goiter    Granulomatous disease (HCC)    Hernia    Kidney stone on left side 2013   NSTEMI (non-ST elevated myocardial infarction) (HCC) 03/23/2020   Oxygen  deficiency    Panic attacks    PVC's (premature ventricular contractions)    a. 03/2018 Zio: Occas PVCs (2.5%). Triggered events assoc w/ PVC/PAC.   Retinal detachment, left 03/04/2020   Retinal tear 2020   Stroke (HCC) 10/29/2016   mild left side weakness   Stroke (HCC) 08/01/2019   Tobacco abuse    Past Surgical History:  Procedure Laterality Date   ABDOMINAL HYSTERECTOMY     BREAST SURGERY     CATARACT EXTRACTION W/PHACO Right 03/01/2017   Procedure: CATARACT EXTRACTION PHACO AND INTRAOCULAR LENS PLACEMENT (IOC) RIGHT DIABETIC;  Surgeon: Mittie Gaskin, MD;  Location: [facility];   Service: Ophthalmology;  Laterality: Right;   CATARACT EXTRACTION W/PHACO Left 03/22/2017   Procedure: CATARACT EXTRACTION PHACO AND INTRAOCULAR LENS PLACEMENT (IOC) LEFT DIABETIC;  Surgeon: Mittie Gaskin, MD;  Location: [facility];  Service: Ophthalmology;  Laterality: Left;  Diabetic - insulin  and oral meds   COLONOSCOPY WITH PROPOFOL  N/A 09/23/2014   Procedure: COLONOSCOPY WITH PROPOFOL ;  Surgeon: Gladis RAYMOND Mariner, MD;  Location: [facility] ENDOSCOPY;  Service: Endoscopy;  Laterality: N/A;   COLONOSCOPY WITH PROPOFOL  N/A 01/11/2018   Procedure: COLONOSCOPY WITH PROPOFOL ;  Surgeon: Mariner Gladis RAYMOND, MD;  Location: [facility] ENDOSCOPY;  Service: Endoscopy;  Laterality: N/A;   COLONOSCOPY WITH PROPOFOL  N/A 04/24/2018   Procedure: COLONOSCOPY WITH PROPOFOL ;  Surgeon: Mariner Gladis RAYMOND, MD;  Location: [facility] ENDOSCOPY;  Service: Endoscopy;  Laterality: N/A;   COLONOSCOPY WITH PROPOFOL  N/A 11/19/2019   Procedure: COLONOSCOPY WITH PROPOFOL ;  Surgeon: Jinny Carmine, MD;  Location: [facility] ENDOSCOPY;  Service: Endoscopy;  Laterality: N/A;   CORONARY ANGIOPLASTY WITH STENT PLACEMENT  2008   CORONARY STENT INTERVENTION N/A 03/24/2020   Procedure: CORONARY STENT INTERVENTION;  Surgeon: Mady Bruckner, MD;  Location: [facility] INVASIVE CV LAB;  Service: Cardiovascular;  Laterality: N/A;   ESOPHAGOGASTRODUODENOSCOPY (EGD) WITH PROPOFOL  N/A 12/30/2014   Procedure: ESOPHAGOGASTRODUODENOSCOPY (EGD) WITH PROPOFOL ;  Surgeon: Gladis RAYMOND Mariner, MD;  Location: [facility] ENDOSCOPY;  Service: Endoscopy;  Laterality: N/A;   ESOPHAGOGASTRODUODENOSCOPY (EGD) WITH PROPOFOL  N/A 07/19/2016   Procedure: ESOPHAGOGASTRODUODENOSCOPY (EGD) WITH PROPOFOL ;  Surgeon: Gaylyn Gladis PENNER, MD;  Location: [facility] ENDOSCOPY;  Service: Endoscopy;  Laterality: N/A;   ESOPHAGOGASTRODUODENOSCOPY (EGD) WITH PROPOFOL  N/A 04/24/2018   Procedure: ESOPHAGOGASTRODUODENOSCOPY (EGD) WITH PROPOFOL ;  Surgeon: Gaylyn Gladis PENNER, MD;  Location: [facility]  ENDOSCOPY;  Service: Endoscopy;  Laterality: N/A;   EYE SURGERY  cataracts, detached retina   hysterectomy (other)     LEFT HEART CATH AND CORONARY ANGIOGRAPHY N/A 03/24/2020   Procedure: LEFT HEART CATH AND CORONARY ANGIOGRAPHY;  Surgeon: Mady Bruckner, MD;  Location: [facility] INVASIVE CV LAB;  Service: Cardiovascular;  Laterality: N/A;   Patient Active Problem List   Diagnosis Date Noted   Stroke (HCC) 08/05/2023   Depression with anxiety 08/05/2023   Overweight (BMI 25.0-29.9) 08/05/2023   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) 03/17/2023   Dizziness 02/23/2023   Restrictive airway disease 07/21/2020   Partial thickness rotator cuff tear 03/20/2020   Shoulder pain 03/05/2020   Ataxia 02/28/2020   Difficulty walking 02/28/2020   Physical deconditioning 02/28/2020   Chronic diastolic congestive heart failure (HCC) 11/20/2019   Polyp of ascending colon    Hypokalemia 08/08/2019   TIA (transient ischemic attack) 08/01/2019   Benign hypertensive kidney disease with chronic kidney disease 04/25/2019   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   Chronic, continuous use of opioids 03/26/2018   History of adenomatous polyp of colon 01/22/2018   Diverticulosis of colon 01/22/2018   History of CVA (cerebrovascular accident) 11/08/2016   Weakness of left upper extremity 10/30/2016   AAA (abdominal aortic aneurysm) without rupture (HCC) 08/12/2016   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   Stage 3b chronic kidney disease (HCC) 07/27/2015   Type 2 diabetes mellitus with diabetic chronic kidney disease (HCC) 10/28/2014   Solitary pulmonary nodule on lung CT 08/20/2014   Allergic rhinitis 07/28/2014   Acid reflux 07/28/2014   Chronic obstructive pulmonary disease (HCC) 07/28/2014   Granuloma annulare    Back pain 11/12/2013   Lumbar scoliosis 10/30/2013   Lumbar radiculopathy 10/30/2013   Lumbar canal stenosis 10/30/2013   Proteinuria 10/22/2013    Smokes with greater than 30 pack year history 03/21/2011   Edema 08/18/2010   Hyperlipidemia 03/25/2009   Coronary artery disease 03/25/2009   Primary hypertension 03/25/2009    ONSET DATE: 03/17/23 (admitted with stroke), 05/23/23 (referral date)  REFERRING DIAG:  R41.841 (ICD-10-CM) - Cognitive communication deficit  I63.9 (ICD-10-CM) - CVA (cerebral vascular accident) (HCC)  R47.02 (ICD-10-CM) - Dysphasia    THERAPY DIAG:  Aphasia  Apraxia of speech  Rationale for Evaluation and Treatment Rehabilitation  SUBJECTIVE:   SUBJECTIVE STATEMENT: Pt alert, pleasant, and cooperative.  Pt accompanied by: self and family member  PERTINENT HISTORY:  Pt is a 78 y.o. female who presents for communication evaluation in setting on stroke. Pt in ED 1/24-1/25/25. Pt d/c'd home with HH ST. PMHx significant of   HTN, CKD stage IIIB, DMT2 on insulin , CAD, chronic smoking, chronic respiratory failure with COPD and on home oxygen , anxiety, HLD. MRI 03/17/23 1. Punctate foci of restricted diffusion in the left posterior  frontal white matter, most likely acute infarcts.  2. Numerous foci of hemosiderin deposition, which are seen in the  deep gray structures but also in the bilateral cerebral hemispheres,  with 1 new focus compared to 02/20/2023. 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   PAIN:  Are you having pain? No  FALLS: defer to PT  LIVING ENVIRONMENT: Lives with: lives with their spouse Lives in: House/apartment  PLOF:  Level of assistance: Independent with ADLs Employment: Retired   PATIENT GOALS    for communication to improve   OBJECTIVE:  TODAY'S TREATMENT:  Reviewed SFA for improved wordfinding. Pt able to generate x3 details of objects with min cueing. Writing targeted with pt verbalizing and writing short sentences with provided target noun with anywhere from  min-max cues for spelling.   Daughter successfully synced/updated SGD at home. Pt utilize SGD to answer questions re: therapy calendar with min/mod cues. Pt utilized scheduling appointments tab for month, DOW, date, and time of day. Pt would benefit from repeated practice.     PATIENT EDUCATION: Education details: as above Person educated: Patient and Child Education method: Explanation Education comprehension: verbalized understanding and needs further education  HOME EXERCISE PROGRAM:    Play with SGD  Utilize to SGD for homework and for repetition    GOALS:  Goals reviewed with patient? Yes  SHORT TERM GOALS: Target date: 10 sessions  Pt will participate in further assessment of functional reading/writing. Baseline: Goal status: INITIAL  2.  With Moderate A, patient will complete a semantic feature analysis with at least 2 relevant features for 8/10 target words to improve word-finding skills.  Baseline:  Goal status: INITIAL  3.  With Maximal A, patient will generate sentences with 3 or more words in response to a situation at 80% accuracy in order to increase ability to communicate basic wants and needs.  Baseline:  Goal status: INITIAL  4.   With Maximal A,  pt will follow 2-step commands for improved participation in ADLs/iADLs with max cueing. Baseline:  Goal status: INITIAL  5.  Pt will repeat short sentences (<4 words) with good approximations 80% of the time with max cueing. Baseline:  Goal status: INITIAL    LONG TERM GOALS: Target date: 12 weeks  Pt will report a subjective improvement in communication per PROM.  Baseline: CES 12/32 on 8/26 Goal status: INITIAL  2.  With Min A, patient/family will demonstrate understanding of the following concepts: aphasia, spontaneous recovery, communication vs conversation, strengths/strategies to promote success, local resources in order to increase patient's participation in medical care.    Baseline:  Goal  status: INITIAL   ASSESSMENT:  CLINICAL IMPRESSION: Pt is a 78 y.o. female who presents for communication treatment in setting on stroke. Pt known to SLP services and was receiving OP ST prior to most recent stroke 08/05/23. Pt d/c'd home with [facility] ST who is working on securing SGD for pt. PMHx significant of  HTN, CKD stage IIIB, DMT2 on insulin , CAD, chronic smoking, chronic respiratory failure with COPD and on home oxygen , anxiety, HLD. Assessment this episode completed via functional/dynamic means including Western Aphasia Battery Revised (WAB-R) and PROM (Communication Effectiveness Scale). 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 See details of tx session above. Recommend course of ST targeting functional communication, further assessment of functional reading/writing, and pt/caregiver training to help promote QoL and overall life participation.   OBJECTIVE IMPAIRMENTS include expressive language, receptive language, and aphasia. These impairments are limiting patient from ADLs/IADLs and effectively communicating at home and in community. Factors affecting potential to achieve goals and functional outcome are severity of impairments. Patient will benefit from skilled SLP services to address above impairments  and improve overall function.  REHAB POTENTIAL: Good  PLAN: SLP FREQUENCY: 2x/week  SLP DURATION: 12 weeks  PLANNED INTERVENTIONS: Language facilitation, Cueing hierachy, Internal/external aids, Functional tasks, Multimodal communication approach, SLP instruction and feedback, Compensatory strategies, and Patient/family education    Delon Bangs, M.S., CCC-SLP Speech-Language Pathologist [facility] [phone] [place])  [facility] Outpatient Rehabilitation at [facility] [address] [place], [place], [address] Phone:  [phone]   Fax:  [phone]

## 2023-10-30 NOTE — Therapy (Signed)
 OUTPATIENT PHYSICAL THERAPY NEURO TREATMENT   Patient Name: Hannah Kirby MRN: [id number] DOB:Jul 12, 1945, 78 y.o., female Today's Date: 10/30/2023   PCP: Gasper [name] BRAVO, MD REFERRING PROVIDER: Gasper [name] BRAVO, MD  END OF SESSION:    PT End of Session - 10/30/23 1730     Visit Number 5    Number of Visits 25    Date for PT Re-Evaluation 01/09/24    Progress Note Due on Visit 10    PT Start Time 1617    PT Stop Time 1700    PT Time Calculation (min) 43 min    Equipment Utilized During Treatment Gait belt;Oxygen     Activity Tolerance Patient tolerated treatment well    Behavior During Therapy WFL for tasks assessed/performed;Flat affect           Past Medical History:  Diagnosis Date   Acute hemorrhagic colitis 08/08/2019   Anemia    C. difficile colitis 08/09/2019   CAD (coronary artery disease)    a. 02/2006 PCI: BMS x 2 to RCA, cath o/w without significant coronary disease; b. nuclear stress test 07/2014: No ischemia/infarct; c. 11/2017 MV: no isch/infarct, EF 55-65%; d. 03/2020 NSTEMI/PCI: LM nl, LAD min irregs, RI 25, small, LCX nl, RCA 30p/m ISR, 99d (3.0x15 Resolute Onyx DES).   Cataract 02/04/2018   Cerebrovascular accident (CVA) (HCC) 03/18/2023   dysarthria   Chronic bronchitis (HCC)    secondary to cigarette smoking   FHx: allergies    Goiter    Granulomatous disease (HCC)    Hernia    Kidney stone on left side 2013   NSTEMI (non-ST elevated myocardial infarction) (HCC) 03/23/2020   Oxygen  deficiency    Panic attacks    PVC's (premature ventricular contractions)    a. 03/2018 Zio: Occas PVCs (2.5%). Triggered events assoc w/ PVC/PAC.   Retinal detachment, left 03/04/2020   Retinal tear 2020   Stroke (HCC) 10/29/2016   mild left side weakness   Stroke (HCC) 08/01/2019   Tobacco abuse    Past Surgical History:  Procedure Laterality Date   ABDOMINAL HYSTERECTOMY     BREAST SURGERY     CATARACT EXTRACTION W/PHACO Right 03/01/2017   Procedure:  CATARACT EXTRACTION PHACO AND INTRAOCULAR LENS PLACEMENT (IOC) RIGHT DIABETIC;  Surgeon: Mittie Gaskin, MD;  Location: [facility];  Service: Ophthalmology;  Laterality: Right;   CATARACT EXTRACTION W/PHACO Left 03/22/2017   Procedure: CATARACT EXTRACTION PHACO AND INTRAOCULAR LENS PLACEMENT (IOC) LEFT DIABETIC;  Surgeon: Mittie Gaskin, MD;  Location: [facility];  Service: Ophthalmology;  Laterality: Left;  Diabetic - insulin  and oral meds   COLONOSCOPY WITH PROPOFOL  N/A 09/23/2014   Procedure: COLONOSCOPY WITH PROPOFOL ;  Surgeon: Gladis RAYMOND Mariner, MD;  Location: [facility] ENDOSCOPY;  Service: Endoscopy;  Laterality: N/A;   COLONOSCOPY WITH PROPOFOL  N/A 01/11/2018   Procedure: COLONOSCOPY WITH PROPOFOL ;  Surgeon: Mariner Gladis RAYMOND, MD;  Location: [facility] ENDOSCOPY;  Service: Endoscopy;  Laterality: N/A;   COLONOSCOPY WITH PROPOFOL  N/A 04/24/2018   Procedure: COLONOSCOPY WITH PROPOFOL ;  Surgeon: Mariner Gladis RAYMOND, MD;  Location: [facility] ENDOSCOPY;  Service: Endoscopy;  Laterality: N/A;   COLONOSCOPY WITH PROPOFOL  N/A 11/19/2019   Procedure: COLONOSCOPY WITH PROPOFOL ;  Surgeon: Jinny Carmine, MD;  Location: [facility] ENDOSCOPY;  Service: Endoscopy;  Laterality: N/A;   CORONARY ANGIOPLASTY WITH STENT PLACEMENT  2008   CORONARY STENT INTERVENTION N/A 03/24/2020   Procedure: CORONARY STENT INTERVENTION;  Surgeon: Mady Bruckner, MD;  Location: [facility] INVASIVE CV LAB;  Service: Cardiovascular;  Laterality: N/A;  ESOPHAGOGASTRODUODENOSCOPY (EGD) WITH PROPOFOL  N/A 12/30/2014   Procedure: ESOPHAGOGASTRODUODENOSCOPY (EGD) WITH PROPOFOL ;  Surgeon: Gladis RAYMOND Mariner, MD;  Location: [facility] ENDOSCOPY;  Service: Endoscopy;  Laterality: N/A;   ESOPHAGOGASTRODUODENOSCOPY (EGD) WITH PROPOFOL  N/A 07/19/2016   Procedure: ESOPHAGOGASTRODUODENOSCOPY (EGD) WITH PROPOFOL ;  Surgeon: Mariner Gladis RAYMOND, MD;  Location: [facility] ENDOSCOPY;  Service: Endoscopy;  Laterality: N/A;   ESOPHAGOGASTRODUODENOSCOPY (EGD)  WITH PROPOFOL  N/A 04/24/2018   Procedure: ESOPHAGOGASTRODUODENOSCOPY (EGD) WITH PROPOFOL ;  Surgeon: Mariner Gladis RAYMOND, MD;  Location: [facility] ENDOSCOPY;  Service: Endoscopy;  Laterality: N/A;   EYE SURGERY  cataracts, detached retina   hysterectomy (other)     LEFT HEART CATH AND CORONARY ANGIOGRAPHY N/A 03/24/2020   Procedure: LEFT HEART CATH AND CORONARY ANGIOGRAPHY;  Surgeon: Mady Bruckner, MD;  Location: [facility] INVASIVE CV LAB;  Service: Cardiovascular;  Laterality: N/A;   Patient Active Problem List   Diagnosis Date Noted   Stroke (HCC) 08/05/2023   Depression with anxiety 08/05/2023   Overweight (BMI 25.0-29.9) 08/05/2023   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) 03/17/2023   Dizziness 02/23/2023   Restrictive airway disease 07/21/2020   Partial thickness rotator cuff tear 03/20/2020   Shoulder pain 03/05/2020   Ataxia 02/28/2020   Difficulty walking 02/28/2020   Physical deconditioning 02/28/2020   Chronic diastolic congestive heart failure (HCC) 11/20/2019   Polyp of ascending colon    Hypokalemia 08/08/2019   TIA (transient ischemic attack) 08/01/2019   Benign hypertensive kidney disease with chronic kidney disease 04/25/2019   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   Chronic, continuous use of opioids 03/26/2018   History of adenomatous polyp of colon 01/22/2018   Diverticulosis of colon 01/22/2018   History of CVA (cerebrovascular accident) 11/08/2016   Weakness of left upper extremity 10/30/2016   AAA (abdominal aortic aneurysm) without rupture (HCC) 08/12/2016   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   Stage 3b chronic kidney disease (HCC) 07/27/2015   Type 2 diabetes mellitus with diabetic chronic kidney disease (HCC) 10/28/2014   Solitary pulmonary nodule on lung CT 08/20/2014   Allergic rhinitis 07/28/2014   Acid reflux 07/28/2014   Chronic obstructive pulmonary disease (HCC) 07/28/2014   Granuloma annulare    Back  pain 11/12/2013   Lumbar scoliosis 10/30/2013   Lumbar radiculopathy 10/30/2013   Lumbar canal stenosis 10/30/2013   Proteinuria 10/22/2013   Smokes with greater than 30 pack year history 03/21/2011   Edema 08/18/2010   Hyperlipidemia 03/25/2009   Coronary artery disease 03/25/2009   Primary hypertension 03/25/2009    ONSET DATE: 08/05/23  REFERRING DIAG:  Z86.73 (ICD-10-CM) - History of CVA (cerebrovascular accident)  R27.0 (ICD-10-CM) - Ataxia    THERAPY DIAG:   Difficulty in walking, not elsewhere classified  Unsteadiness on feet  Muscle weakness (generalized)  Abnormality of gait and mobility  Other abnormalities of gait and mobility  Apraxia following other cerebrovascular disease  Rationale for Evaluation and Treatment: Rehabilitation  SUBJECTIVE:  SUBJECTIVE STATEMENT: Pt states that she is doing okay today, has been busy with other appointments.   Pt denies any new updates. Pt reports that she has been completing HEP, but probably not as many as I should.  Daughter reports that when pt is using O2 at home, she is using 3L of O2.  Daughter reports that portable O2 doesn't work very well; battery lasting ~1 hr.      Pt accompanied by: self and family member, daughter  PERTINENT HISTORY:  PMH: of CVA, COPD, Dysarthria, Ataxia, TIA, T2DM, Back pain, scoliosis   PAIN:  Are you having pain? Yes: NPRS scale: 3/10 Pain location: low back and L hip Pain description: achy Aggravating factors: just hurts all the time. Relieving factors: hydrocodone  at night  PRECAUTIONS: Other: Pt has a looper to see if pt has a-fib  RED FLAGS: Abdominal aneurysm: Yes: pt reports having an abdominal aneurism as found by Dr. Marea.   WEIGHT BEARING RESTRICTIONS: No  FALLS: Has patient fallen  in last 6 months? Yes. Number of falls 1.  Pt reports losing balance, and got caught up in cords that were on the floor when she was trying to close the blinds.  LIVING ENVIRONMENT: Lives with: lives with their spouse Lives in: House/apartment Stairs: Yes: External: 1 steps; none Has following equipment at home: Single point cane, Walker - 2 wheeled, Environmental consultant - 4 wheeled, shower chair, and Grab bars  PLOF: Independent  PATIENT GOALS: Pt is wanting to gain independence.  Pt is wanting to improve endurance levels and improving speech with SLP.  OBJECTIVE:  Note: Objective measures were completed at Evaluation unless otherwise noted.  DIAGNOSTIC FINDINGS:   EXAM: MRI HEAD WITHOUT CONTRAST   MRA HEAD WITHOUT CONTRAST   MRA NECK WITHOUT AND WITH CONTRAST   IMPRESSION: 1. Small acute infarcts in the left frontal lobe, right parietal lobe, and left cerebellum. 2. Extensive chronic small vessel ischemic disease. 3. 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 4. Unchanged severe right A3 stenosis or segmental occlusion. 5. Moderate left P2 stenosis. 6. Limited assessment of the left vertebral artery origin, otherwise negative neck MRA.   COGNITION: Overall cognitive status: Within functional limits for tasks assessed   SENSATION: WFL, however pt notes having some numbness in the R hand.  COORDINATION: WFL  POSTURE: rounded shoulders, forward head, decreased lumbar lordosis, increased thoracic kyphosis, posterior pelvic tilt, and flexed trunk    LOWER EXTREMITY MMT:    MMT Right Eval Left Eval  Hip flexion 4 4  Hip extension    Hip abduction 4 4-  Hip adduction 4 4-  Hip internal rotation    Hip external rotation    Knee flexion 4 4-  Knee extension 4 4-  Ankle dorsiflexion 4 4-  Ankle plantarflexion    Ankle inversion    Ankle eversion    (Blank rows = not tested)  BED MOBILITY:  Not tested  FUNCTIONAL TESTS:  5 times sit to stand: 24.36 sec Timed up  and go (TUG): 23.94 sec with walker 2 minute walk test: TBD 10 meter walk test: 20.33 sec with walker Berg Balance Scale: TBD  PATIENT SURVEYS:   Stroke Impact Scale 16 (Copyrighted instrument, [facility])  In the past 2 weeks, how difficult was it to...  Rating Scale 5 = Not difficult at all 4 = A little difficult 3 = Somewhat difficult 2 = Very difficult 1 = Could not do at all  a. Dress the top part of your body? 4  b. Bathe yourself? 2  c. Get to the toilet on time? 5  d. Control your bladder (not have an accident)? 5  e. Control your bowels (not have an accident)? 5  f. Stand without losing balance? 3  g. Go shopping? 2  h. Do heavy household chores (e.g. vacuum, laundry or  yard work)? 1  i. Stay sitting without losing your balance? 3  j. Walk without losing your balance? 2  k. Move from a bed to a chair? 3  l. Walk fast? 1  m. Climb one flight of stairs? 1  n. Walk one block? 1  o. Get in and out of a car? 2  p. Carry heavy objects (e.g. bag of groceries) with your  affected hand? 1  Sum:  41/80  MDC (Minimal Detectable Change) is >/=8                                                                                                                                 TREATMENT DATE: 10/30/23 Pt arrived to session in transport chair, daughter present.   SpO2: 99% upon arrival to the clinic with room air; seated.  SpO2 upon standing: desat to 86%; O2 donned via Elaine started with 1 L, SpO2 to 88%; increased to 2L O2, SpO2 increase to 95-96%.  Pt requiring verbal cueing for pursed lip breathing for increased SpO2  Gait, gait belt donned w/ close SBA-CGA for safety & monitoring of SpO2  3 laps (450'): increased O2 from 2L> 3 L d/t desaturation to 78%; with 3L O2, SpO2 88-90% RPE: 12 Pt requiring verbal cueing for pursed lip breathing for increased SpO2 throughout gait  Seated on 3 L of O2: 94-99%  2x10 STS w/ airex pad for increased seat  height; able to complete with SBA for monitoring of SpO2 1st set RPE: 14/20 2nd set RPE: 16/20 SpO2 88-92% throughout on 3L O2  2x10 each LE stair taps:  RPE following activity: 17/20. Pt exhibiting some SOB.  Pt desaturating to 76% following activity on 3 L O2; continued to cue pt on pursed lip breathing for increased SpO2; seated rest break provided following activity, ~3 min. SpO2 to 96% on 3 L O2 following seated rest break Attempted stair stepping (1 LE on 1st step, other LE on 2nd step) but pt unable to follow cueing  Pt & daughter educated throughout session on importance of supplemental O2 use at home.   Pt completed transfer to transport chair. SpO2 98% on 3L seated in chair.  Pt weaned to 1 L O2, SpO2 stable at 96-98%. Supplemental O2 removed, SpO2 stable at 97-99%.  Pt left session in transport chair.   Of note, throughout session difficult to obtain SpO2 readings using finger pulse oximeter due to patient's nail polish.   PATIENT EDUCATION: Education details: Pt educated on role of PT and services provided during current POC,  along with prognosis and information about the clinic.  Person educated: Patient and Child(ren) Education method: Explanation Education comprehension: verbalized understanding and returned demonstration  HOME EXERCISE PROGRAM:  Access Code: [email or web address] URL: [email or web address] Date: 10/17/2023 Prepared by: Sidra Simpers  Exercises - Standing March with Counter Support  - 1 x daily - 3-4 x weekly - 3 sets - 10 reps - Standing Knee Flexion with Unilateral Counter Support  - 1 x daily - 7 x weekly - 3 sets - 10 reps - Standing Hip Extension with Unilateral Counter Support  - 1 x daily - 7 x weekly - 3 sets - 10 reps - Standing Hip Abduction with Unilateral Counter Support  - 1 x daily - 7 x weekly - 3 sets - 10 reps - Heel Toe Raises with Unilateral Counter Support  - 1 x daily - 7 x weekly - 3 sets - 10 reps  GOALS: Goals reviewed  with patient? Yes  SHORT TERM GOALS: Target date: 11/15/2023  Pt will be independent with HEP in order to demonstrate increased ability to perform tasks related to occupation/hobbies. Baseline: pt given HEP at eval Goal status: INITIAL  LONG TERM GOALS: Target date: 01/10/2024  1.  Patient (> 100 years old) will complete five times sit to stand test in < 15 seconds indicating an increased LE strength and improved balance. Baseline: 24.36 sec Goal status: INITIAL  2.  Patient will improve SIS 16 score to 55   to demonstrate statistically significant improvement in mobility and quality of life as it relates to their CVA.  Baseline: 41 Goal status: INITIAL   3.  Patient will increase Berg Balance score by > 6 points to demonstrate decreased fall risk during functional activities. Baseline: 33/56 Goal status: INITIAL   4.  Patient will reduce timed up and go to <11 seconds to reduce fall risk and demonstrate improved transfer/gait ability. Baseline: 23.94 sec with walker Goal status: INITIAL  5.  Patient will increase 10 meter walk test to >1.95m/s as to improve gait speed for better community ambulation and to reduce fall risk. Baseline: 20.33 sec with walker Goal status: INITIAL  6.  Patient will increase 2 minute walk test distance by 50 ft or greater for progression to community ambulator and demonstrate improved gait ability  Baseline: 275'; 83.82 m Goal status: INITIAL     ASSESSMENT:  CLINICAL IMPRESSION:  Pt continues to be given education on pursed lip breathing technique throughout activities to manage shortness of breath.  Pt continues to require supplemental O2 with more dynamic tasks, which was focus of today's session.  Pt utilized 1-3L of O2 today to maintain SpO2 >90%.  Attempted to limit seated rest breaks this date in order to continue to challenge endurance.  Pt will continue to benefit from skilled therapy to address remaining deficits in order to improve overall  QoL and return to PLOF.        OBJECTIVE IMPAIRMENTS: Abnormal gait, decreased activity tolerance, decreased balance, decreased endurance, decreased knowledge of use of DME, decreased mobility, difficulty walking, decreased strength, impaired sensation, and pain.   ACTIVITY LIMITATIONS: carrying, lifting, bending, standing, squatting, stairs, transfers, bathing, toileting, dressing, hygiene/grooming, and locomotion level  PARTICIPATION LIMITATIONS: meal prep, cleaning, laundry, community activity, and yard work  PERSONAL FACTORS: Age and 3+ comorbidities:  PMH: of CVA, COPD, Dysarthria, Ataxia, TIA, T2DM, Back pain, scoliosis are also affecting patient's functional outcome.   REHAB POTENTIAL: Good  CLINICAL DECISION MAKING: Evolving/moderate complexity  EVALUATION COMPLEXITY: Moderate  PLAN:  PT FREQUENCY: 2x/week  PT DURATION: 12 weeks  PLANNED INTERVENTIONS: 97750- Physical Performance Testing, 97110-Therapeutic exercises, 97530- Therapeutic activity, 97112- Neuromuscular re-education, 97535- Self Care, 97140- Manual therapy, [phone]- Gait training, Balance training, and Vestibular training  PLAN FOR NEXT SESSION:   Monitor SPO2 and get portable oxygen  if necessary  Endurance, balance, strength ( L>R), updated HEP, gait training  -Limiting seated rest breaks as tolerated to challenge endurance   Chiquita Silvan, SPT Physical Therapy Student - [facility]  [facility]  10/30/23, 5:56 PM

## 2023-10-31 ENCOUNTER — Ambulatory Visit

## 2023-11-01 ENCOUNTER — Telehealth: Payer: Self-pay

## 2023-11-01 NOTE — Telephone Encounter (Signed)
 Spoke w/ patient daughter regarding device alert. Reassured no AF was detected at this time. Educated patient daughter on ILR device and how monitor works. Also educated on AF during phone call. Informed if AF is detected in the future we will call once device alert is received to assess symptoms and schedule F/U appointment to potentially start OAC. Verbalized understanding.   Informed to call device clinic with any further questions or concerns. Patient daughter requests to call her at [phone] for future callbacks. Patient appreciative for call.

## 2023-11-01 NOTE — Telephone Encounter (Signed)
 Alert remote transmission: Tachy ILR 1 new tachy event 9/9 @ 23:28, duration 10sec, HR 176, NCT   ____________________________________________________________________  Hannah Kirby w/ patient regarding ILR device alert for new tachy event c/w NCT w/ duration 10 seconds @ 23:28 on 10/31/2023.   Patient states she does not recall any symptoms and was asleep during this time. States medication compliance.   Informed patient to call device clinic for any further questions or concerns. Will continue to monitor and update accordingly. Patient appreciative for call.

## 2023-11-01 NOTE — Telephone Encounter (Signed)
 Pts daughter calling for information. Please advise.

## 2023-11-02 ENCOUNTER — Ambulatory Visit

## 2023-11-02 DIAGNOSIS — R2689 Other abnormalities of gait and mobility: Secondary | ICD-10-CM

## 2023-11-02 DIAGNOSIS — M6281 Muscle weakness (generalized): Secondary | ICD-10-CM

## 2023-11-02 DIAGNOSIS — R269 Unspecified abnormalities of gait and mobility: Secondary | ICD-10-CM | POA: Diagnosis not present

## 2023-11-02 DIAGNOSIS — R4701 Aphasia: Secondary | ICD-10-CM

## 2023-11-02 DIAGNOSIS — R262 Difficulty in walking, not elsewhere classified: Secondary | ICD-10-CM | POA: Diagnosis not present

## 2023-11-02 DIAGNOSIS — R482 Apraxia: Secondary | ICD-10-CM

## 2023-11-02 DIAGNOSIS — R2681 Unsteadiness on feet: Secondary | ICD-10-CM | POA: Diagnosis not present

## 2023-11-02 DIAGNOSIS — R278 Other lack of coordination: Secondary | ICD-10-CM

## 2023-11-02 NOTE — Therapy (Addendum)
 OUTPATIENT OCCUPATIONAL THERAPY NEURO TREATMENT NOTE  Patient Name: Hannah Kirby MRN: [id number] DOB:1945-08-15, 78 y.o., female Today's Date: 11/03/2023  PCP: Dr. [name] Perry REFERRING PROVIDER: Dr. [name] Perry  END OF SESSION:  OT End of Session - 11/03/23 1838     Visit Number 2    Number of Visits 24    Date for OT Re-Evaluation 01/22/24    OT Start Time 1400    OT Stop Time 1445    OT Time Calculation (min) 45 min    Activity Tolerance Patient tolerated treatment well    Behavior During Therapy [facility] for tasks assessed/performed          Past Medical History:  Diagnosis Date   Acute hemorrhagic colitis 08/08/2019   Anemia    C. difficile colitis 08/09/2019   CAD (coronary artery disease)    a. 02/2006 PCI: BMS x 2 to RCA, cath o/w without significant coronary disease; b. nuclear stress test 07/2014: No ischemia/infarct; c. 11/2017 MV: no isch/infarct, EF 55-65%; d. 03/2020 NSTEMI/PCI: LM nl, LAD min irregs, RI 25, small, LCX nl, RCA 30p/m ISR, 99d (3.0x15 Resolute Onyx DES).   Cataract 02/04/2018   Cerebrovascular accident (CVA) (HCC) 03/18/2023   dysarthria   Chronic bronchitis (HCC)    secondary to cigarette smoking   FHx: allergies    Goiter    Granulomatous disease (HCC)    Hernia    Kidney stone on left side 2013   NSTEMI (non-ST elevated myocardial infarction) (HCC) 03/23/2020   Oxygen  deficiency    Panic attacks    PVC's (premature ventricular contractions)    a. 03/2018 Zio: Occas PVCs (2.5%). Triggered events assoc w/ PVC/PAC.   Retinal detachment, left 03/04/2020   Retinal tear 2020   Stroke (HCC) 10/29/2016   mild left side weakness   Stroke (HCC) 08/01/2019   Tobacco abuse    Past Surgical History:  Procedure Laterality Date   ABDOMINAL HYSTERECTOMY     BREAST SURGERY     CATARACT EXTRACTION W/PHACO Right 03/01/2017   Procedure: CATARACT EXTRACTION PHACO AND INTRAOCULAR LENS PLACEMENT (IOC) RIGHT DIABETIC;  Surgeon: Mittie Gaskin,  MD;  Location: [facility];  Service: Ophthalmology;  Laterality: Right;   CATARACT EXTRACTION W/PHACO Left 03/22/2017   Procedure: CATARACT EXTRACTION PHACO AND INTRAOCULAR LENS PLACEMENT (IOC) LEFT DIABETIC;  Surgeon: Mittie Gaskin, MD;  Location: [facility];  Service: Ophthalmology;  Laterality: Left;  Diabetic - insulin  and oral meds   COLONOSCOPY WITH PROPOFOL  N/A 09/23/2014   Procedure: COLONOSCOPY WITH PROPOFOL ;  Surgeon: Gladis RAYMOND Mariner, MD;  Location: [facility] ENDOSCOPY;  Service: Endoscopy;  Laterality: N/A;   COLONOSCOPY WITH PROPOFOL  N/A 01/11/2018   Procedure: COLONOSCOPY WITH PROPOFOL ;  Surgeon: Mariner Gladis RAYMOND, MD;  Location: [facility] ENDOSCOPY;  Service: Endoscopy;  Laterality: N/A;   COLONOSCOPY WITH PROPOFOL  N/A 04/24/2018   Procedure: COLONOSCOPY WITH PROPOFOL ;  Surgeon: Mariner Gladis RAYMOND, MD;  Location: [facility] ENDOSCOPY;  Service: Endoscopy;  Laterality: N/A;   COLONOSCOPY WITH PROPOFOL  N/A 11/19/2019   Procedure: COLONOSCOPY WITH PROPOFOL ;  Surgeon: Jinny Carmine, MD;  Location: [facility] ENDOSCOPY;  Service: Endoscopy;  Laterality: N/A;   CORONARY ANGIOPLASTY WITH STENT PLACEMENT  2008   CORONARY STENT INTERVENTION N/A 03/24/2020   Procedure: CORONARY STENT INTERVENTION;  Surgeon: Mady Bruckner, MD;  Location: [facility] INVASIVE CV LAB;  Service: Cardiovascular;  Laterality: N/A;   ESOPHAGOGASTRODUODENOSCOPY (EGD) WITH PROPOFOL  N/A 12/30/2014   Procedure: ESOPHAGOGASTRODUODENOSCOPY (EGD) WITH PROPOFOL ;  Surgeon: Gladis RAYMOND Mariner, MD;  Location: [facility] ENDOSCOPY;  Service: Endoscopy;  Laterality: N/A;   ESOPHAGOGASTRODUODENOSCOPY (EGD) WITH PROPOFOL  N/A 07/19/2016   Procedure: ESOPHAGOGASTRODUODENOSCOPY (EGD) WITH PROPOFOL ;  Surgeon: Gaylyn Gladis PENNER, MD;  Location: [facility] ENDOSCOPY;  Service: Endoscopy;  Laterality: N/A;   ESOPHAGOGASTRODUODENOSCOPY (EGD) WITH PROPOFOL  N/A 04/24/2018   Procedure: ESOPHAGOGASTRODUODENOSCOPY (EGD) WITH PROPOFOL ;  Surgeon: Gaylyn Gladis PENNER, MD;  Location: [facility] ENDOSCOPY;  Service: Endoscopy;  Laterality: N/A;   EYE SURGERY  cataracts, detached retina   hysterectomy (other)     LEFT HEART CATH AND CORONARY ANGIOGRAPHY N/A 03/24/2020   Procedure: LEFT HEART CATH AND CORONARY ANGIOGRAPHY;  Surgeon: Mady Bruckner, MD;  Location: [facility] INVASIVE CV LAB;  Service: Cardiovascular;  Laterality: N/A;   Patient Active Problem List   Diagnosis Date Noted   Stroke (HCC) 08/05/2023   Depression with anxiety 08/05/2023   Overweight (BMI 25.0-29.9) 08/05/2023   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) 03/17/2023   Dizziness 02/23/2023   Restrictive airway disease 07/21/2020   Partial thickness rotator cuff tear 03/20/2020   Shoulder pain 03/05/2020   Ataxia 02/28/2020   Difficulty walking 02/28/2020   Physical deconditioning 02/28/2020   Chronic diastolic congestive heart failure (HCC) 11/20/2019   Polyp of ascending colon    Hypokalemia 08/08/2019   TIA (transient ischemic attack) 08/01/2019   Benign hypertensive kidney disease with chronic kidney disease 04/25/2019   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   Chronic, continuous use of opioids 03/26/2018   History of adenomatous polyp of colon 01/22/2018   Diverticulosis of colon 01/22/2018   History of CVA (cerebrovascular accident) 11/08/2016   Weakness of left upper extremity 10/30/2016   AAA (abdominal aortic aneurysm) without rupture (HCC) 08/12/2016   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   Stage 3b chronic kidney disease (HCC) 07/27/2015   Type 2 diabetes mellitus with diabetic chronic kidney disease (HCC) 10/28/2014   Solitary pulmonary nodule on lung CT 08/20/2014   Allergic rhinitis 07/28/2014   Acid reflux 07/28/2014   Chronic obstructive pulmonary disease (HCC) 07/28/2014   Granuloma annulare    Back pain 11/12/2013   Lumbar scoliosis 10/30/2013   Lumbar radiculopathy 10/30/2013   Lumbar canal stenosis  10/30/2013   Proteinuria 10/22/2013   Smokes with greater than 30 pack year history 03/21/2011   Edema 08/18/2010   Hyperlipidemia 03/25/2009   Coronary artery disease 03/25/2009   Primary hypertension 03/25/2009   ONSET DATE: 08/05/2023  REFERRING DIAG: M70.101 (ICD-10-CM) - Weakness of left upper extremity   THERAPY DIAG:  Muscle weakness (generalized)  Other lack of coordination  Rationale for Evaluation and Treatment: Rehabilitation  SUBJECTIVE:  SUBJECTIVE STATEMENT: Pt reports only the normal pain today (see below). Pt accompanied by: family member: daughter, Harrie  PERTINENT HISTORY: Daughter present and provided hx.  Daughter reports pt has had 4 strokes, beginning in 2018, 2019, January of 2025 and the most recent in June of 2025.  Pt had been participating in [facility] since the most recent CVA, and is now eager to transition to outpatient services where PT/OT/SLP have been ordered. Per MEDICAL RECORD NUMBERPMH: of CVA, COPD, Dysarthria, Ataxia, TIA, T2DM, Back pain, scoliosis   PRECAUTIONS: Fall, Loop recorder for Afib  WEIGHT BEARING RESTRICTIONS: No  PAIN: 11/02/23: same as eval below Are you having pain? Yes: NPRS scale: 3/10, can get up to 7-8/10 Pain location: low back and L hip Pain description: achy, sharp, dull Aggravating factors: prolonged standing  Relieving factors: sitting, pain meds, heat/ice   FALLS: Has patient fallen in last 6 months?  Yes. Number of falls 1, tripping over cords  LIVING ENVIRONMENT: Lives with: lives with their spouse Lives in: 1 level home Stairs: Yes: External: 1 steps; none Has following equipment at home: walk in shower, comfort height toilet   PLOF: Prior to Jan of this year, pt was managing all home management tasks, was able to drive, and was caring for spouse   PATIENT GOALS: Strengthening the left arm   OBJECTIVE:  Note: Objective measures were completed at Evaluation unless otherwise noted.  HAND DOMINANCE:  Right  ADLs: Overall ADLs: Daughter is pt's primary caregiver and lives near by Transfers/ambulation related to ADLs: occasional help with using walker at home, depending on balance on a given day Eating: indep/able to cut food  Grooming: bimanual grooming without difficulty UB Dressing: extra time for clothing fasteners, but able to gather clothing with RW LB Dressing: extra time with clothing fasteners Toileting: occasional help to walk to the bathroom, but modified indep within the bathroom for toileting tasks Bathing: direct supv for bathing using walkin in shower, shower chair, and grab bar; gets hair washed weekly at salon Tub Shower transfers: supv for walk in shower Equipment: RW, SBQC, shower chair, grab bars  IADLs: Shopping: daughter currently managing Light housekeeping: daughter currently managing; can start laundry on a good day.  Meal Prep: starting to participate in light meal prep, including making eggs on stove top Community mobility: daughter assists with all transportation; pt using transport chair to reach therapy gym today Medication management: daughter sets up pills 2 weeks at a time  Financial management: pt is participating in bill paying with daughter's assistance; most are online Handwriting: 100% legible  POSTURE COMMENTS:  rounded shoulders, forward head, increased thoracic kyphosis  ACTIVITY TOLERANCE: Activity tolerance: to be further assessed within functional contexts  FUNCTIONAL OUTCOME MEASURES: TBD  UPPER EXTREMITY ROM:  BUEs WFL for daily tasks  UPPER EXTREMITY MMT:  Hx of R rotator cuff tear 4-5 years ago with residual weakness, non-surgical;    MMT Right eval Left eval  Shoulder flexion 3+ 4  Shoulder abduction 3+ 4  Shoulder adduction    Shoulder extension    Shoulder internal rotation 3+ 4-  Shoulder external rotation 3+ 3+  Elbow flexion 5 5  Elbow extension 5 5  Wrist flexion 4+ 5  Wrist extension 4+ 5  Wrist ulnar  deviation    Wrist radial deviation    Wrist pronation    Wrist supination    (Blank rows = not tested)   *Most recent CVA affecting L non-dominant side, with LUE presenting with increased strength as compared to dominant/unaffected arm.  Assume RUE weaker from hx of rotator cuff tear, or possibly previous CVAs.  Pt does present with mild LUE ataxia, and bilat hand weakness.  HAND FUNCTION: Grip strength: Right: 32 lbs; Left: 30 lbs, Lateral pinch: Right: 8 lbs, Left: 6 lbs, and 3 point pinch: Right: 7 lbs, Left: 6 lbs  COORDINATION: Finger Nose Finger test: LUE mild ataxia  9 Hole Peg test: Right: 29 sec; Left: 47 sec  SENSATION: Pt reports intermittent numbness in the R thumb, IF, and LF  EDEMA: No visible edema  MUSCLE TONE: RUE: Within functional limits and LUE: Within functional limits  COGNITION: Overall cognitive status: Impaired; see SLP eval for details  VISION: Pt reports L eye feels more blurry since first stroke in 2018; pt has reading glasses but doesn't usually wear them   VISION ASSESSMENT: To be further assessed in functional context  Tracking/Visual pursuits: Able to track stimulus in all quads without difficulty Saccades: WFL Visual Fields: no apparent deficits Clock drawing completed: all numbers spaced evenly, but pt left out the 1 and verbalized not knowing how to draw ten minutes to eleven when this direction was given.  Patient has difficulty with following activities due to following visual impairments: To be further assessed  PERCEPTION: To be further assessed  PRAXIS: Impaired: Motor planning  OBSERVATIONS: Pt pleasant, cooperative.  Daughter present and very supportive in pt's care.                                                                                                                       TREATMENT DATE: 11/02/23 Therapeutic Activity: -Motor-Free Visual Perceptual Test (MVPT) completed:   Visual Discrimination: 3/3 [place]:  3/5 Visual Discrimination: 4/5  Visual Memory: 6/8  Visual Closure: 7/11  Visual Spatial: 3/4   Total: 26/36  *7 errors (out of 21 possible correct) with L sided figures  *3 errors (out of 15 possible correct) with R sided figures   Therapeutic Exercise: -Issued yellow theraputty and instructed pt in strengthening exercises for R/L hands, including gross grasping, lateral/2 point/3 point pinching, digit abd/add, and digging coins out of putty.  Able to return demo with mod vc for technique to improve quality of movement.  Encouraged completion 5-15 min, 1-2x per day.  Visual handout issued to enable carryover at home.   PATIENT EDUCATION: Education details: HEP Person educated: Patient and Child(ren) Education method: Programmer, multimedia, Facilities manager, Verbal cues, and Handouts Education comprehension: verbalized understanding and needs further education  HOME EXERCISE PROGRAM: Yellow theraputty  GOALS: Goals reviewed with patient? Yes  SHORT TERM GOALS: Target date: 12/11/23  Pt will perform HEP with min vc or less for improving LUE coordination and bilat hand strength. Baseline: Eval: HEP not yet initiated Goal status: INITIAL  2.  Pt will be indep to verbalize and implement 2-3 fall prevention measures to reduce fall risk with ADLs.  Baseline: Eval: Educ not yet initiated  Goal status: INITIAL  LONG TERM GOALS: Target date: 01/22/24  Pt will perform shower transfers with modified indep Baseline: Eval: Direct supv Goal status: INITIAL  2.  Pt will improve LUE GMC to enable reaching with good accuracy into overhead kitchen cabinets. Baseline: Eval: Mild LUE ataxia  Goal status: INITIAL  3.  Pt will improve L hand [facility] skills as demonstrated by 10 sec or more improvement in 9 hole peg test for improved efficiency when manipulating  clothing fasteners. Baseline: Eval: L 9 hole peg test: 47 sec, R 29 sec Goal status: INITIAL  4.  Pt will manage light loads of laundry with  modified indep. Baseline: Eval: Daughter mostly manages, but pt can start laundry on a good day Goal status: INITIAL  ASSESSMENT:  CLINICAL IMPRESSION: 26/36 correct on MVPT, indicating deficits in visual/perceptual skills in each category.  Will plan to review scoring with pt and daughter in upcoming sessions to provide education on  potential ADL impact when considering visual/perceptual deficits.  Good tolerance to yellow theraputty exercises this date.  Visual handout issued to increase carryover at home; currently requiring mod vc/tactile cues to complete exercises accurately.  Pt will continue to benefit from skilled OT to address above noted deficits while working to maximize pt's level of indep with ADL/IADL tasks in order to reduce burden of care on daughter and improve QOL.     PERFORMANCE DEFICITS: in functional skills including ADLs, IADLs, coordination, dexterity, sensation, ROM, strength, pain, Fine motor control, Gross motor control, mobility, balance, body mechanics, endurance, decreased knowledge of precautions, decreased knowledge of use of DME, vision, and UE functional use, cognitive skills including emotional, perception, problem solving, safety awareness, temperament/personality, and understand, and psychosocial skills including coping strategies, environmental adaptation, and routines and behaviors.   IMPAIRMENTS: are limiting patient from ADLs, IADLs, leisure, and social participation.   CO-MORBIDITIES: has co-morbidities such as afib, AAA, hx of multiple CVAs that affects occupational performance. Patient will benefit from skilled OT to address above impairments and improve overall function.  MODIFICATION OR ASSISTANCE TO COMPLETE EVALUATION: Min-Moderate modification of tasks or assist with assess necessary to complete an evaluation.  OT OCCUPATIONAL PROFILE AND HISTORY: Detailed assessment: Review of records and additional review of physical, cognitive, psychosocial  history related to current functional performance.  CLINICAL DECISION MAKING: Moderate - several treatment options, min-mod task modification necessary  REHAB POTENTIAL: Good  EVALUATION COMPLEXITY: Moderate    PLAN:  OT FREQUENCY: 2x/week  OT DURATION: 12 weeks  PLANNED INTERVENTIONS: 97168 OT Re-evaluation, 97535 self care/ADL training, 02889 therapeutic exercise, 97530 therapeutic activity, 97112 neuromuscular re-education, 97140 manual therapy, 97116 gait training, 02989 moist heat, 97010 cryotherapy, 97129 Cognitive training (first 15 min), 02249 Physical Performance Testing, balance training, functional mobility training, visual/perceptual remediation/compensation, psychosocial skills training, energy conservation, coping strategies training, patient/family education, and DME and/or AE instructions  RECOMMENDED OTHER SERVICES: none at this time (OT/PT/SLP services have all been ordered)  CONSULTED AND AGREED WITH PLAN OF CARE: Patient and family member/caregiver  PLAN FOR NEXT SESSION: see above  Inocente Blazing, MS, OTR/L  Inocente MARLA Blazing, OT 11/03/2023, 6:41 PM

## 2023-11-02 NOTE — Therapy (Signed)
 OUTPATIENT PHYSICAL THERAPY NEURO TREATMENT   Patient Name: Hannah Kirby MRN: [id number] DOB:February 03, 1946, 78 y.o., female Today's Date: 11/02/2023   PCP: Gasper [name] BRAVO, MD REFERRING PROVIDER: Gasper [name] BRAVO, MD  END OF SESSION:    PT End of Session - 11/02/23 1320     Visit Number 6    Number of Visits 25    Date for PT Re-Evaluation 01/09/24    Progress Note Due on Visit 10    PT Start Time 1320    PT Stop Time 1400    PT Time Calculation (min) 40 min    Equipment Utilized During Treatment Gait belt;Oxygen     Activity Tolerance Patient tolerated treatment well    Behavior During Therapy WFL for tasks assessed/performed;Flat affect           Past Medical History:  Diagnosis Date   Acute hemorrhagic colitis 08/08/2019   Anemia    C. difficile colitis 08/09/2019   CAD (coronary artery disease)    a. 02/2006 PCI: BMS x 2 to RCA, cath o/w without significant coronary disease; b. nuclear stress test 07/2014: No ischemia/infarct; c. 11/2017 MV: no isch/infarct, EF 55-65%; d. 03/2020 NSTEMI/PCI: LM nl, LAD min irregs, RI 25, small, LCX nl, RCA 30p/m ISR, 99d (3.0x15 Resolute Onyx DES).   Cataract 02/04/2018   Cerebrovascular accident (CVA) (HCC) 03/18/2023   dysarthria   Chronic bronchitis (HCC)    secondary to cigarette smoking   FHx: allergies    Goiter    Granulomatous disease (HCC)    Hernia    Kidney stone on left side 2013   NSTEMI (non-ST elevated myocardial infarction) (HCC) 03/23/2020   Oxygen  deficiency    Panic attacks    PVC's (premature ventricular contractions)    a. 03/2018 Zio: Occas PVCs (2.5%). Triggered events assoc w/ PVC/PAC.   Retinal detachment, left 03/04/2020   Retinal tear 2020   Stroke (HCC) 10/29/2016   mild left side weakness   Stroke (HCC) 08/01/2019   Tobacco abuse    Past Surgical History:  Procedure Laterality Date   ABDOMINAL HYSTERECTOMY     BREAST SURGERY     CATARACT EXTRACTION W/PHACO Right 03/01/2017   Procedure:  CATARACT EXTRACTION PHACO AND INTRAOCULAR LENS PLACEMENT (IOC) RIGHT DIABETIC;  Surgeon: Mittie Gaskin, MD;  Location: [facility];  Service: Ophthalmology;  Laterality: Right;   CATARACT EXTRACTION W/PHACO Left 03/22/2017   Procedure: CATARACT EXTRACTION PHACO AND INTRAOCULAR LENS PLACEMENT (IOC) LEFT DIABETIC;  Surgeon: Mittie Gaskin, MD;  Location: [facility];  Service: Ophthalmology;  Laterality: Left;  Diabetic - insulin  and oral meds   COLONOSCOPY WITH PROPOFOL  N/A 09/23/2014   Procedure: COLONOSCOPY WITH PROPOFOL ;  Surgeon: Gladis RAYMOND Mariner, MD;  Location: [facility] ENDOSCOPY;  Service: Endoscopy;  Laterality: N/A;   COLONOSCOPY WITH PROPOFOL  N/A 01/11/2018   Procedure: COLONOSCOPY WITH PROPOFOL ;  Surgeon: Mariner Gladis RAYMOND, MD;  Location: [facility] ENDOSCOPY;  Service: Endoscopy;  Laterality: N/A;   COLONOSCOPY WITH PROPOFOL  N/A 04/24/2018   Procedure: COLONOSCOPY WITH PROPOFOL ;  Surgeon: Mariner Gladis RAYMOND, MD;  Location: [facility] END[facility]COPY;  Service: Endoscopy;  Laterality: N/A;   COLONOSCOPY WITH PROPOFOL  N/A 11/19/2019   Procedure: COLONOSCOPY WITH PROPOFOL ;  Surgeon: Jinny Carmine, MD;  Location: [facility] ENDOSCOPY;  Service: Endoscopy;  Laterality: N/A;   CORONARY ANGIOPLASTY WITH STENT PLACEMENT  2008   CORONARY STENT INTERVENTION N/A 03/24/2020   Procedure: CORONARY STENT INTERVENTION;  Surgeon: Mady Bruckner, MD;  Location: [facility] INVASIVE CV LAB;  Service: Cardiovascular;  Laterality: N/A;  ESOPHAGOGASTRODUODENOSCOPY (EGD) WITH PROPOFOL  N/A 12/30/2014   Procedure: ESOPHAGOGASTRODUODENOSCOPY (EGD) WITH PROPOFOL ;  Surgeon: Gladis RAYMOND Mariner, MD;  Location[facility];  Service: Endoscopy;  Laterality: N/A;   ESOPHAGOGASTRODUODENOSCOPY (EGD) WITH PROPOFOL  N/A 07/19/2016   Procedure: ESOPHAGOGASTRODUODENOSCOPY (EGD) WITH PROPOFOL ;  Surgeon: Mariner Gladis RAYMOND, MD;  Location: [facility] ENDOSCOPY;  Service: Endoscopy;  Laterality: N/A;   ESOPHAGOGASTRODUODENOSCOPY (EGD)  WITH PROPOFOL  N/A 04/24/2018   Procedure: ESOPHAGOGASTRODUODENOSCOPY (EGD) WITH PROPOFOL ;  Surgeon: Mariner Gladis RAYMOND, MD;  Location: [facility] ENDOSCOPY;  Service: Endoscopy;  Laterality: N/A;   EYE SURGERY  cataracts, detached retina   hysterectomy (other)     LEFT HEART CATH AND CORONARY ANGIOGRAPHY N/A 03/24/2020   Procedure: LEFT HEART CATH AND CORONARY ANGIOGRAPHY;  Surgeon: Mady Bruckner, MD;  Location: [facility] INVASIVE CV LAB;  Service: Cardiovascular;  Laterality: N/A;   Patient Active Problem List   Diagnosis Date Noted   Stroke (HCC) 08/05/2023   Depression with anxiety 08/05/2023   Overweight (BMI 25.0-29.9) 08/05/2023   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) 03/17/2023   Dizziness 02/23/2023   Restrictive airway disease 07/21/2020   Partial thickness rotator cuff tear 03/20/2020   Shoulder pain 03/05/2020   Ataxia 02/28/2020   Difficulty walking 02/28/2020   Physical deconditioning 02/28/2020   Chronic diastolic congestive heart failure (HCC) 11/20/2019   Polyp of ascending colon    Hypokalemia 08/08/2019   TIA (transient ischemic attack) 08/01/2019   Benign hypertensive kidney disease with chronic kidney disease 04/25/2019   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   Chronic, continuous use of opioids 03/26/2018   History of adenomatous polyp of colon 01/22/2018   Diverticulosis of colon 01/22/2018   History of CVA (cerebrovascular accident) 11/08/2016   Weakness of left upper extremity 10/30/2016   AAA (abdominal aortic aneurysm) without rupture (HCC) 08/12/2016   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   Stage 3b chronic kidney disease (HCC) 07/27/2015   Type 2 diabetes mellitus with diabetic chronic kidney disease (HCC) 10/28/2014   Solitary pulmonary nodule on lung CT 08/20/2014   Allergic rhinitis 07/28/2014   Acid reflux 07/28/2014   Chronic obstructive pulmonary disease (HCC) 07/28/2014   Granuloma annulare    Back  pain 11/12/2013   Lumbar scoliosis 10/30/2013   Lumbar radiculopathy 10/30/2013   Lumbar canal stenosis 10/30/2013   Proteinuria 10/22/2013   Smokes with greater than 30 pack year history 03/21/2011   Edema 08/18/2010   Hyperlipidemia 03/25/2009   Coronary artery disease 03/25/2009   Primary hypertension 03/25/2009    ONSET DATE: 08/05/23  REFERRING DIAG:  Z86.73 (ICD-10-CM) - History of CVA (cerebrovascular accident)  R27.0 (ICD-10-CM) - Ataxia    THERAPY DIAG:   Muscle weakness (generalized)  Other lack of coordination  Difficulty in walking, not elsewhere classified  Unsteadiness on feet  Abnormality of gait and mobility  Other abnormalities of gait and mobility  Rationale for Evaluation and Treatment: Rehabilitation  SUBJECTIVE:  SUBJECTIVE STATEMENT:  Pt and daughter reports that the current portable oxygen  is not working properly because it states the hose is incorrect.  Both report that the battery still only lasts 1 hour before dying anyways, and that's why they don't use it for appointments since she is here for 2.5 hours.       Pt accompanied by: self and family member, daughter  PERTINENT HISTORY:  PMH: of CVA, COPD, Dysarthria, Ataxia, TIA, T2DM, Back pain, scoliosis   PAIN:  Are you having pain? Yes: NPRS scale: 3/10 Pain location: low back and L hip Pain description: achy Aggravating factors: just hurts all the time. Relieving factors: hydrocodone  at night  PRECAUTIONS: Other: Pt has a looper to see if pt has a-fib  RED FLAGS: Abdominal aneurysm: Yes: pt reports having an abdominal aneurism as found by Dr. Marea.   WEIGHT BEARING RESTRICTIONS: No  FALLS: Has patient fallen in last 6 months? Yes. Number of falls 1.  Pt reports losing balance, and got caught up in  cords that were on the floor when she was trying to close the blinds.  LIVING ENVIRONMENT: Lives with: lives with their spouse Lives in: House/apartment Stairs: Yes: External: 1 steps; none Has following equipment at home: Single point cane, Walker - 2 wheeled, Environmental consultant - 4 wheeled, shower chair, and Grab bars  PLOF: Independent  PATIENT GOALS: Pt is wanting to gain independence.  Pt is wanting to improve endurance levels and improving speech with SLP.  OBJECTIVE:  Note: Objective measures were completed at Evaluation unless otherwise noted.  DIAGNOSTIC FINDINGS:   EXAM: MRI HEAD WITHOUT CONTRAST   MRA HEAD WITHOUT CONTRAST   MRA NECK WITHOUT AND WITH CONTRAST   IMPRESSION: 1. Small acute infarcts in the left frontal lobe, right parietal lobe, and left cerebellum. 2. Extensive chronic small vessel ischemic disease. 3. 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 4. Unchanged severe right A3 stenosis or segmental occlusion. 5. Moderate left P2 stenosis. 6. Limited assessment of the left vertebral artery origin, otherwise negative neck MRA.   COGNITION: Overall cognitive status: Within functional limits for tasks assessed   SENSATION: WFL, however pt notes having some numbness in the R hand.  COORDINATION: WFL  POSTURE: rounded shoulders, forward head, decreased lumbar lordosis, increased thoracic kyphosis, posterior pelvic tilt, and flexed trunk    LOWER EXTREMITY MMT:    MMT Right Eval Left Eval  Hip flexion 4 4  Hip extension    Hip abduction 4 4-  Hip adduction 4 4-  Hip internal rotation    Hip external rotation    Knee flexion 4 4-  Knee extension 4 4-  Ankle dorsiflexion 4 4-  Ankle plantarflexion    Ankle inversion    Ankle eversion    (Blank rows = not tested)  BED MOBILITY:  Not tested  FUNCTIONAL TESTS:  5 times sit to stand: 24.36 sec Timed up and go (TUG): 23.94 sec with walker 2 minute walk test: TBD 10 meter walk test: 20.33  sec with walker Berg Balance Scale: TBD  PATIENT SURVEYS:   Stroke Impact Scale 16 (Copyrighted instrument, [facility])  In the past 2 weeks, how difficult was it to...  Rating Scale 5 = Not difficult at all 4 = A little difficult 3 = Somewhat difficult 2 = Very difficult 1 = Could not do at all  a. Dress the top part of your body? 4  b. Bathe yourself? 2  c.  Get to the toilet on time? 5  d. Control your bladder (not have an accident)? 5  e. Control your bowels (not have an accident)? 5  f. Stand without losing balance? 3  g. Go shopping? 2  h. Do heavy household chores (e.g. vacuum, laundry or  yard work)? 1  i. Stay sitting without losing your balance? 3  j. Walk without losing your balance? 2  k. Move from a bed to a chair? 3  l. Walk fast? 1  m. Climb one flight of stairs? 1  n. Walk one block? 1  o. Get in and out of a car? 2  p. Carry heavy objects (e.g. bag of groceries) with your  affected hand? 1  Sum:  41/80  MDC (Minimal Detectable Change) is >/=8                                                                                                                                 TREATMENT DATE: 11/02/23  Pt arrived to session in transport chair, daughter present.   SpO2: 95% upon arrival to the clinic with room air; seated.   TherAct: To improve functional movements patterns for everyday tasks  Ambulation around the gym x2 full laps, ~450', with poor pleth reading on both mobile and dynamap which resulted in inconsistent O2 readings, however dropped to 84% when ambulating the laps.  Ambulation around the gym x3 laps with HR elevated to 127 bpm at peak and pt reporting 15 on BORG RPE scale.     TherEx: To improve strength, endurance, mobility, and function of specific targeted muscle groups or improve joint range of motion or improve muscle flexibility  Seated hamstring curls with BlueTB, 2x10 each LE  Seated resisted hip marches  with BlueTB, 2x10 each LE Seated LAQ's with BlueTB resistance applied at the ankle, 2x10 each LE    PATIENT EDUCATION: Education details: Pt educated on role of PT and services provided during current POC, along with prognosis and information about the clinic.  Person educated: Patient and Child(ren) Education method: Explanation Education comprehension: verbalized understanding and returned demonstration  HOME EXERCISE PROGRAM:  Access Code: [email or web address] URL: [email or web address] Date: 10/17/2023 Prepared by: Sidra Simpers  Exercises - Standing March with Counter Support  - 1 x daily - 3-4 x weekly - 3 sets - 10 reps - Standing Knee Flexion with Unilateral Counter Support  - 1 x daily - 7 x weekly - 3 sets - 10 reps - Standing Hip Extension with Unilateral Counter Support  - 1 x daily - 7 x weekly - 3 sets - 10 reps - Standing Hip Abduction with Unilateral Counter Support  - 1 x daily - 7 x weekly - 3 sets - 10 reps - Heel Toe Raises with Unilateral Counter Support  - 1 x daily - 7 x weekly - 3 sets - 10 reps  GOALS: Goals reviewed with patient? Yes  SHORT TERM GOALS: Target  date: 11/15/2023  Pt will be independent with HEP in order to demonstrate increased ability to perform tasks related to occupation/hobbies. Baseline: pt given HEP at eval Goal status: INITIAL  LONG TERM GOALS: Target date: 01/10/2024  1.  Patient (> 1 years old) will complete five times sit to stand test in < 15 seconds indicating an increased LE strength and improved balance. Baseline: 24.36 sec Goal status: INITIAL  2.  Patient will improve SIS 16 score to 55   to demonstrate statistically significant improvement in mobility and quality of life as it relates to their CVA.  Baseline: 41 Goal status: INITIAL   3.  Patient will increase Berg Balance score by > 6 points to demonstrate decreased fall risk during functional activities. Baseline: 33/56 Goal status: INITIAL   4.  Patient will  reduce timed up and go to <11 seconds to reduce fall risk and demonstrate improved transfer/gait ability. Baseline: 23.94 sec with walker Goal status: INITIAL  5.  Patient will increase 10 meter walk test to >1.23m/s as to improve gait speed for better community ambulation and to reduce fall risk. Baseline: 20.33 sec with walker Goal status: INITIAL  6.  Patient will increase 2 minute walk test distance by 50 ft or greater for progression to community ambulator and demonstrate improved gait ability  Baseline: 275'; 83.82 m Goal status: INITIAL     ASSESSMENT:  CLINICAL IMPRESSION:  Pt performed well today, however the oxygen  saturation was difficult to monitor more than usual.  Therapist utilized the portable monitor and the dynamap and pleth readings were consistently inconsistent.  Pt continues to report wanting to improve overall strength levels of the LE's.   Pt will continue to benefit from skilled therapy to address remaining deficits in order to improve overall QoL and return to PLOF.      OBJECTIVE IMPAIRMENTS: Abnormal gait, decreased activity tolerance, decreased balance, decreased endurance, decreased knowledge of use of DME, decreased mobility, difficulty walking, decreased strength, impaired sensation, and pain.   ACTIVITY LIMITATIONS: carrying, lifting, bending, standing, squatting, stairs, transfers, bathing, toileting, dressing, hygiene/grooming, and locomotion level  PARTICIPATION LIMITATIONS: meal prep, cleaning, laundry, community activity, and yard work  PERSONAL FACTORS: Age and 3+ comorbidities:  PMH: of CVA, COPD, Dysarthria, Ataxia, TIA, T2DM, Back pain, scoliosis are also affecting patient's functional outcome.   REHAB POTENTIAL: Good  CLINICAL DECISION MAKING: Evolving/moderate complexity  EVALUATION COMPLEXITY: Moderate  PLAN:  PT FREQUENCY: 2x/week  PT DURATION: 12 weeks  PLANNED INTERVENTIONS: 97750- Physical Performance Testing,  97110-Therapeutic exercises, 97530- Therapeutic activity, 97112- Neuromuscular re-education, 97535- Self Care, [id number]- Manual therapy, [id number]- Gait training, Balance training, and Vestibular training  PLAN FOR NEXT SESSION:   Monitor SPO2 and get portable oxygen  if necessary  Endurance, balance, strength ( L>R), updated HEP, gait training  -Limiting seated rest breaks as tolerated to challenge endurance   Fonda Simpers, PT, DPT Physical Therapist - [facility]  11/02/23, 5:48 PM

## 2023-11-02 NOTE — Therapy (Signed)
 OUTPATIENT SPEECH LANGUAGE PATHOLOGY APHASIA TREATMENT   Patient Name: Hannah Kirby MRN: [id number] DOB:07-04-1945, 78 y.o., female Today's Date: 11/02/2023  PCP: [name] Perry, MD REFERRING PROVIDER: [name] Perry, MD   End of Session - 11/02/23 1431     Visit Number 6    Number of Visits 23    Date for SLP Re-Evaluation 01/09/24    SLP Start Time 1445    SLP Stop Time  1530    SLP Time Calculation (min) 45 min    Activity Tolerance Patient tolerated treatment well          Past Medical History:  Diagnosis Date   Acute hemorrhagic colitis 08/08/2019   Anemia    C. difficile colitis 08/09/2019   CAD (coronary artery disease)    a. 02/2006 PCI: BMS x 2 to RCA, cath o/w without significant coronary disease; b. nuclear stress test 07/2014: No ischemia/infarct; c. 11/2017 MV: no isch/infarct, EF 55-65%; d. 03/2020 NSTEMI/PCI: LM nl, LAD min irregs, RI 25, small, LCX nl, RCA 30p/m ISR, 99d (3.0x15 Resolute Onyx DES).   Cataract 02/04/2018   Cerebrovascular accident (CVA) (HCC) 03/18/2023   dysarthria   Chronic bronchitis (HCC)    secondary to cigarette smoking   FHx: allergies    Goiter    Granulomatous disease (HCC)    Hernia    Kidney stone on left side 2013   NSTEMI (non-ST elevated myocardial infarction) (HCC) 03/23/2020   Oxygen  deficiency    Panic attacks    PVC's (premature ventricular contractions)    a. 03/2018 Zio: Occas PVCs (2.5%). Triggered events assoc w/ PVC/PAC.   Retinal detachment, left 03/04/2020   Retinal tear 2020   Stroke (HCC) 10/29/2016   mild left side weakness   Stroke (HCC) 08/01/2019   Tobacco abuse    Past Surgical History:  Procedure Laterality Date   ABDOMINAL HYSTERECTOMY     BREAST SURGERY     CATARACT EXTRACTION W/PHACO Right 03/01/2017   Procedure: CATARACT EXTRACTION PHACO AND INTRAOCULAR LENS PLACEMENT (IOC) RIGHT DIABETIC;  Surgeon: Mittie Gaskin, MD;  Location: [facility];  Service: Ophthalmology;  Laterality:  Right;   CATARACT EXTRACTION W/PHACO Left 03/22/2017   Procedure: CATARACT EXTRACTION PHACO AND INTRAOCULAR LENS PLACEMENT (IOC) LEFT DIABETIC;  Surgeon: Mittie Gaskin, MD;  Location: [facility];  Service: Ophthalmology;  Laterality: Left;  Diabetic - insulin  and oral meds   COLONOSCOPY WITH PROPOFOL  N/A 09/23/2014   Procedure: COLONOSCOPY WITH PROPOFOL ;  Surgeon: Gladis RAYMOND Mariner, MD;  Location: [facility] ENDOSCOPY;  Service: Endoscopy;  Laterality: N/A;   COLONOSCOPY WITH PROPOFOL  N/A 01/11/2018   Procedure: COLONOSCOPY WITH PROPOFOL ;  Surgeon: Mariner Gladis RAYMOND, MD;  Location: [facility] ENDOSCOPY;  Service: Endoscopy;  Laterality: N/A;   COLONOSCOPY WITH PROPOFOL  N/A 04/24/2018   Procedure: COLONOSCOPY WITH PROPOFOL ;  Surgeon: Mariner Gladis RAYMOND, MD;  Location: [facility] ENDOSCOPY;  Service: Endoscopy;  Laterality: N/A;   COLONOSCOPY WITH PROPOFOL  N/A 11/19/2019   Procedure: COLONOSCOPY WITH PROPOFOL ;  Surgeon: Jinny Carmine, MD;  Location: [facility] ENDOSCOPY;  Service: Endoscopy;  Laterality: N/A;   CORONARY ANGIOPLASTY WITH STENT PLACEMENT  2008   CORONARY STENT INTERVENTION N/A 03/24/2020   Procedure: CORONARY STENT INTERVENTION;  Surgeon: Mady Bruckner, MD;  Location: [facility] INVASIVE CV LAB;  Service: Cardiovascular;  Laterality: N/A;   ESOPHAGOGASTRODUODENOSCOPY (EGD) WITH PROPOFOL  N/A 12/30/2014   Procedure: ESOPHAGOGASTRODUODENOSCOPY (EGD) WITH PROPOFOL ;  Surgeon: Gladis RAYMOND Mariner, MD;  Location: [facility] ENDOSCOPY;  Service: Endoscopy;  Laterality: N/A;   ESOPHAGOGASTRODUODENOSCOPY (EGD) WITH PROPOFOL   N/A 07/19/2016   Procedure: ESOPHAGOGASTRODUODENOSCOPY (EGD) WITH PROPOFOL ;  Surgeon: Gaylyn Gladis PENNER, MD;  Location: [facility] END[facility]COPY;  Service: Endoscopy;  Laterality: N/A;   ESOPHAGOGASTRODUODENOSCOPY (EGD) WITH PROPOFOL  N/A 04/24/2018   Procedure: ESOPHAGOGASTRODUODENOSCOPY (EGD) WITH PROPOFOL ;  Surgeon: Gaylyn Gladis PENNER, MD;  Location: [facility] ENDOSCOPY;  Service: Endoscopy;  Laterality:  N/A;   EYE SURGERY  cataracts, detached retina   hysterectomy (other)     LEFT HEART CATH AND CORONARY ANGIOGRAPHY N/A 03/24/2020   Procedure: LEFT HEART CATH AND CORONARY ANGIOGRAPHY;  Surgeon: Mady Bruckner, MD;  Location: [facility] INVASIVE CV LAB;  Service: Cardiovascular;  Laterality: N/A;   Patient Active Problem List   Diagnosis Date Noted   Stroke (HCC) 08/05/2023   Depression with anxiety 08/05/2023   Overweight (BMI 25.0-29.9) 08/05/2023   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) 03/17/2023   Dizziness 02/23/2023   Restrictive airway disease 07/21/2020   Partial thickness rotator cuff tear 03/20/2020   Shoulder pain 03/05/2020   Ataxia 02/28/2020   Difficulty walking 02/28/2020   Physical deconditioning 02/28/2020   Chronic diastolic congestive heart failure (HCC) 11/20/2019   Polyp of ascending colon    Hypokalemia 08/08/2019   TIA (transient ischemic attack) 08/01/2019   Benign hypertensive kidney disease with chronic kidney disease 04/25/2019   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   Chronic, continuous use of opioids 03/26/2018   History of adenomatous polyp of colon 01/22/2018   Diverticulosis of colon 01/22/2018   History of CVA (cerebrovascular accident) 11/08/2016   Weakness of left upper extremity 10/30/2016   AAA (abdominal aortic aneurysm) without rupture (HCC) 08/12/2016   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   Stage 3b chronic kidney disease (HCC) 07/27/2015   Type 2 diabetes mellitus with diabetic chronic kidney disease (HCC) 10/28/2014   Solitary pulmonary nodule on lung CT 08/20/2014   Allergic rhinitis 07/28/2014   Acid reflux 07/28/2014   Chronic obstructive pulmonary disease (HCC) 07/28/2014   Granuloma annulare    Back pain 11/12/2013   Lumbar scoliosis 10/30/2013   Lumbar radiculopathy 10/30/2013   Lumbar canal stenosis 10/30/2013   Proteinuria 10/22/2013   Smokes with greater than 30 pack year  history 03/21/2011   Edema 08/18/2010   Hyperlipidemia 03/25/2009   Coronary artery disease 03/25/2009   Primary hypertension 03/25/2009    ONSET DATE: 03/17/23 (admitted with stroke), 05/23/23 (referral date)  REFERRING DIAG:  R41.841 (ICD-10-CM) - Cognitive communication deficit  I63.9 (ICD-10-CM) - CVA (cerebral vascular accident) (HCC)  R47.02 (ICD-10-CM) - Dysphasia    THERAPY DIAG:  Aphasia  Apraxia of speech  Rationale for Evaluation and Treatment Rehabilitation  SUBJECTIVE:   SUBJECTIVE STATEMENT: Pt alert, pleasant, and cooperative.  Pt accompanied by: self and family member  PERTINENT HISTORY:  Pt is a 78 y.o. female who presents for communication evaluation in setting on stroke. Pt in ED 1/24-1/25/25. Pt d/c'd home with HH ST. PMHx significant of   HTN, CKD stage IIIB, DMT2 on insulin , CAD, chronic smoking, chronic respiratory failure with COPD and on home oxygen , anxiety, HLD. MRI 03/17/23 1. Punctate foci of restricted diffusion in the left posterior  frontal white matter, most likely acute infarcts.  2. Numerous foci of hemosiderin deposition, which are seen in the  deep gray structures but also in the bilateral cerebral hemispheres,  with 1 new focus compared to 02/20/2023. While this could be the  sequela of chronic hypertensive microhemorrhages, this appearance is  concerning for cerebral amyloid angiopathy.   PAIN:  Are you  having pain? No  FALLS: defer to PT  LIVING ENVIRONMENT: Lives with: lives with their spouse Lives in: House/apartment  PLOF:  Level of assistance: Independent with ADLs Employment: Retired   PATIENT GOALS    for communication to improve   OBJECTIVE:  TODAY'S TREATMENT:  Pt with meeting with [facility] during next scheduled SLP session. Co-constructed script on SGD with answers to possible questions during intake. SLP added to SGD. Pt demo'd use of script with min A for navigating tabs.  Pt utilize SGD to  answer questions re: therapy calendar with min/mod cues. Pt utilized scheduling appointments tab for month, DOW, date, and time of day.   Pt generated phrases/sentences of >3 words on 5/5 opportunities when provided with pictured actions. Min/mod cues needed to utilize SFA to improve wordfinding.     PATIENT EDUCATION: Education details: as above Person educated: Patient and Child Education method: Explanation Education comprehension: verbalized understanding and needs further education  HOME EXERCISE PROGRAM:    SGD home practice worksheet    GOALS:  Goals reviewed with patient? Yes  SHORT TERM GOALS: Target date: 10 sessions  Pt will participate in further assessment of functional reading/writing. Baseline: Goal status: INITIAL  2.  With Moderate A, patient will complete a semantic feature analysis with at least 2 relevant features for 8/10 target words to improve word-finding skills.  Baseline:  Goal status: INITIAL  3.  With Maximal A, patient will generate sentences with 3 or more words in response to a situation at 80% accuracy in order to increase ability to communicate basic wants and needs.  Baseline:  Goal status: INITIAL  4.   With Maximal A,  pt will follow 2-step commands for improved participation in ADLs/iADLs with max cueing. Baseline:  Goal status: INITIAL  5.  Pt will repeat short sentences (<4 words) with good approximations 80% of the time with max cueing. Baseline:  Goal status: INITIAL    LONG TERM GOALS: Target date: 12 weeks  Pt will report a subjective improvement in communication per PROM.  Baseline: CES 12/32 on 8/26 Goal status: INITIAL  2.  With Min A, patient/family will demonstrate understanding of the following concepts: aphasia, spontaneous recovery, communication vs conversation, strengths/strategies to promote success, local resources in order to increase patient's participation in medical care.    Baseline:  Goal status:  INITIAL   ASSESSMENT:  CLINICAL IMPRESSION: Pt is a 78 y.o. female who presents for communication treatment in setting on stroke. Pt known to SLP services and was receiving OP ST prior to most recent stroke 08/05/23. Pt d/c'd home with [facility] ST who is working on securing SGD for pt. PMHx significant of  HTN, CKD stage IIIB, DMT2 on insulin , CAD, chronic smoking, chronic respiratory failure with COPD and on home oxygen , anxiety, HLD. Assessment this episode completed via functional/dynamic means including Western Aphasia Battery Revised (WAB-R) and PROM (Communication Effectiveness Scale). 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 See details of tx session above. Recommend course of ST targeting functional communication, further assessment of functional reading/writing, and pt/caregiver training to help promote QoL and overall life participation.   OBJECTIVE IMPAIRMENTS include expressive language, receptive language, and aphasia. These impairments are limiting patient from ADLs/IADLs and effectively communicating at home and in community. Factors affecting potential to achieve goals and functional outcome are severity of impairments. Patient will benefit from skilled SLP services to address above impairments and improve  overall function.  REHAB POTENTIAL: Good  PLAN: SLP FREQUENCY: 2x/week  SLP DURATION: 12 weeks  PLANNED INTERVENTIONS: Language facilitation, Cueing hierachy, Internal/external aids, Functional tasks, Multimodal communication approach, SLP instruction and feedback, Compensatory strategies, and Patient/family education    Delon Bangs, M.S., CCC-SLP Speech-Language Pathologist [place] [facility] [phone] [place])  [facility] at [facility] [address] [place], [place], [address] Phone:  [phone]   Fax:  [phone]

## 2023-11-03 ENCOUNTER — Other Ambulatory Visit: Payer: Self-pay | Admitting: Cardiovascular Disease

## 2023-11-04 ENCOUNTER — Other Ambulatory Visit: Payer: Self-pay

## 2023-11-04 ENCOUNTER — Emergency Department
Admission: EM | Admit: 2023-11-04 | Discharge: 2023-11-05 | Disposition: A | Discharge status: Home / Self Care | Attending: Emergency Medicine | Admitting: Emergency Medicine

## 2023-11-04 ENCOUNTER — Emergency Department

## 2023-11-04 DIAGNOSIS — I2510 Atherosclerotic heart disease of native coronary artery without angina pectoris: Secondary | ICD-10-CM | POA: Insufficient documentation

## 2023-11-04 DIAGNOSIS — J449 Chronic obstructive pulmonary disease, unspecified: Secondary | ICD-10-CM | POA: Insufficient documentation

## 2023-11-04 DIAGNOSIS — F17200 Nicotine dependence, unspecified, uncomplicated: Secondary | ICD-10-CM | POA: Insufficient documentation

## 2023-11-04 DIAGNOSIS — R42 Dizziness and giddiness: Secondary | ICD-10-CM | POA: Insufficient documentation

## 2023-11-04 DIAGNOSIS — E1122 Type 2 diabetes mellitus with diabetic chronic kidney disease: Secondary | ICD-10-CM | POA: Diagnosis not present

## 2023-11-04 DIAGNOSIS — N189 Chronic kidney disease, unspecified: Secondary | ICD-10-CM | POA: Diagnosis not present

## 2023-11-04 DIAGNOSIS — I639 Cerebral infarction, unspecified: Secondary | ICD-10-CM | POA: Diagnosis not present

## 2023-11-04 DIAGNOSIS — R29818 Other symptoms and signs involving the nervous system: Secondary | ICD-10-CM | POA: Diagnosis not present

## 2023-11-04 DIAGNOSIS — G319 Degenerative disease of nervous system, unspecified: Secondary | ICD-10-CM | POA: Diagnosis not present

## 2023-11-04 DIAGNOSIS — I6782 Cerebral ischemia: Secondary | ICD-10-CM | POA: Diagnosis not present

## 2023-11-04 DIAGNOSIS — Z8673 Personal history of transient ischemic attack (TIA), and cerebral infarction without residual deficits: Secondary | ICD-10-CM | POA: Diagnosis not present

## 2023-11-04 LAB — COMPREHENSIVE METABOLIC PANEL WITH GFR
ALT: 14 U/L (ref 0–44)
AST: 22 U/L (ref 15–41)
Albumin: 3.6 g/dL (ref 3.5–5.0)
Alkaline Phosphatase: 78 U/L (ref 38–126)
Anion gap: 10 (ref 5–15)
BUN: 19 mg/dL (ref 8–23)
CO2: 24 mmol/L (ref 22–32)
Calcium: 9.7 mg/dL (ref 8.9–10.3)
Chloride: 106 mmol/L (ref 98–111)
Creatinine, Ser: 1.42 mg/dL — ABNORMAL HIGH (ref 0.44–1.00)
GFR, Estimated: 38 mL/min — ABNORMAL LOW (ref >60)
Glucose, Bld: 141 mg/dL — ABNORMAL HIGH (ref 70–99)
Potassium: 4.2 mmol/L (ref 3.5–5.1)
Sodium: 140 mmol/L (ref 135–145)
Total Bilirubin: 0.5 mg/dL (ref 0.0–1.2)
Total Protein: 7.7 g/dL (ref 6.5–8.1)

## 2023-11-04 LAB — TROPONIN I (HIGH SENSITIVITY)
Troponin I (High Sensitivity): 9 ng/L (ref <18)
Troponin I (High Sensitivity): 9 ng/L (ref <18)

## 2023-11-04 LAB — CBC
HCT: 51.7 % — ABNORMAL HIGH (ref 36.0–46.0)
Hemoglobin: 16.8 g/dL — ABNORMAL HIGH (ref 12.0–15.0)
MCH: 31.8 pg (ref 26.0–34.0)
MCHC: 32.5 g/dL (ref 30.0–36.0)
MCV: 97.9 fL (ref 80.0–100.0)
Platelets: 219 K/uL (ref 150–400)
RBC: 5.28 MIL/uL — ABNORMAL HIGH (ref 3.87–5.11)
RDW: 14.6 % (ref 11.5–15.5)
WBC: 9.3 K/uL (ref 4.0–10.5)
nRBC: 0 % (ref 0.0–0.2)

## 2023-11-04 LAB — CBG MONITORING, ED: Glucose-Capillary: 139 mg/dL — ABNORMAL HIGH (ref 70–99)

## 2023-11-04 LAB — RESP PANEL BY RT-PCR (RSV, FLU A&B, COVID)  RVPGX2
Influenza A by PCR: NEGATIVE
Influenza B by PCR: NEGATIVE
Resp Syncytial Virus by PCR: NEGATIVE
SARS Coronavirus 2 by RT PCR: NEGATIVE

## 2023-11-04 LAB — PROTIME-INR
INR: 1 (ref 0.8–1.2)
Prothrombin Time: 13.5 s (ref 11.4–15.2)

## 2023-11-04 MED ORDER — MECLIZINE HCL 25 MG PO TABS
25.0000 mg | ORAL_TABLET | Freq: Once | ORAL | Status: AC
  Administered 2023-11-04: 25 mg via ORAL
  Filled 2023-11-04: qty 1

## 2023-11-04 NOTE — ED Provider Notes (Signed)
 [facility] Provider Note    Event Date/Time   First MD Initiated Contact with Patient 11/04/23 2104     (approximate)   History   Dizziness   HPI  Hannah Kirby is a 78 y.o. female who presents to the ED for evaluation of Dizziness   Review a PCP visit from 8/27.  History of stroke earlier this year with residual aphasia, impaired balance and mobility. DM, COPD, CKD, CAD, tobacco abuse  Patient presents to the ED with her daughter for evaluation of 2 days of vertiginous dizziness.   Physical Exam   Triage Vital Signs: ED Triage Vitals  Encounter Vitals Group     BP 11/04/23 2015 (!) 165/105     Girls Systolic BP Percentile --      Girls Diastolic BP Percentile --      Boys Systolic BP Percentile --      Boys Diastolic BP Percentile --      Pulse Rate 11/04/23 2015 77     Resp 11/04/23 2015 18     Temp 11/04/23 2015 98.5 F (36.9 C)     Temp Source 11/04/23 2015 Oral     SpO2 11/04/23 2021 100 %     Weight 11/04/23 2015 158 lb 8.2 oz (71.9 kg)     Height --      Head Circumference --      Peak Flow --      Pain Score 11/04/23 2015 0     Pain Loc --      Pain Education --      Exclude from Growth Chart --     Most recent vital signs: Vitals:   11/04/23 2015 11/04/23 2021  BP: (!) 165/105   Pulse: 77   Resp: 18   Temp: 98.5 F (36.9 C)   SpO2:  100%    General: Awake, no distress.  Aphasic, apparently at baseline per the daughter CV:  Good peripheral perfusion.  Resp:  Normal effort.  Abd:  No distention.  MSK:  No deformity noted.  Neuro:  No focal deficits appreciated. Other:     ED Results / Procedures / Treatments   Labs (all labs ordered are listed, but only abnormal results are displayed) Labs Reviewed  COMPREHENSIVE METABOLIC PANEL WITH GFR - Abnormal; Notable for the following components:      Result Value   Glucose, Bld 141 (*)    Creatinine, Ser 1.42 (*)    GFR, Estimated 38 (*)    All other components  within normal limits  CBC - Abnormal; Notable for the following components:   RBC 5.28 (*)    Hemoglobin 16.8 (*)    HCT 51.7 (*)    All other components within normal limits  CBG MONITORING, ED - Abnormal; Notable for the following components:   Glucose-Capillary 139 (*)    All other components within normal limits  RESP PANEL BY RT-PCR (RSV, FLU A&B, COVID)  [id number]  PROTIME-INR  URINALYSIS, ROUTINE W REFLEX MICROSCOPIC  TROPONIN I (HIGH SENSITIVITY)  TROPONIN I (HIGH SENSITIVITY)    EKG Sinus rhythm the rate is 78 bpm.  Normal axis and intervals without clear signs of acute ischemia.  RADIOLOGY CT head interpreted by me without evidence of acute intracranial pathology\  Official radiology report(s): CT Head Wo Contrast Result Date: 11/04/2023 CLINICAL DATA:  Neurologic deficit, dizziness, hypertension EXAM: CT HEAD WITHOUT CONTRAST TECHNIQUE: Contiguous axial images were obtained from the base of the skull through the  vertex without intravenous contrast. RADIATION DOSE REDUCTION: This exam was performed according to the departmental dose-optimization program which includes automated exposure control, adjustment of the mA and/or kV according to patient size and/or use of iterative reconstruction technique. COMPARISON:  08/05/2023 FINDINGS: Brain: Stable chronic small-vessel ischemic changes are seen within the basal ganglia and periventricular white matter. No evidence of acute infarct or hemorrhage. The lateral ventricles and remaining midline structures are unremarkable. No acute extra-axial fluid collections. No mass effect. Vascular: No hyperdense vessel or unexpected calcification. Skull: Normal. Negative for fracture or focal lesion. Sinuses/Orbits: No acute finding. Other: None. IMPRESSION: 1. Stable head CT, no acute intracranial process. Electronically Signed   By: Ozell Daring M.D.   On: 11/04/2023 20:39    PROCEDURES and INTERVENTIONS:  .1-3 Lead EKG  Interpretation  Performed by: Claudene Rover, MD Authorized by: Claudene Rover, MD     Interpretation: normal     ECG rate:  74   ECG rate assessment: normal     Rhythm: sinus rhythm     Ectopy: none     Conduction: normal     Medications  meclizine  (ANTIVERT ) tablet 25 mg (25 mg Oral Given 11/04/23 2240)     IMPRESSION / MDM / ASSESSMENT AND PLAN / ED COURSE  I reviewed the triage vital signs and the nursing notes.  Differential diagnosis includes, but is not limited to, stroke, BPPV, viral syndrome, UTI, anxiety  {Patient presents with symptoms of an acute illness or injury that is potentially life-threatening.  Stroke patient presents with 2 days of waxing and waning vertiginous dizziness.  No clear acute deficits on my exam but she does report persistent unsteadiness/vertiginous dizziness.  Renal dysfunction at baseline, normal WBC, troponin and INR.  Will add on UA, COVID swab and MRI brain to assess for CVA.  Clear CT head.  Signed out to oncoming physician.  Clinical Course as of 11/04/23 2315  Sat Nov 04, 2023  2302 S/o from Dr. Claudene: 29F hx prior CVA w/ aphasia p/w 2 days vertigo/dizziness - multiple vascular issues at baseline - 2 days persistent vertigo/dysequilibrium - f/u UA, covid - CTH neg, MRI pending  TO DO: - f/u UA, viral swab, MRI - if unremarkable, potential DC home [MM]    Clinical Course User Index [MM] [name] LABOR, MD     FINAL CLINICAL IMPRESSION(S) / ED DIAGNOSES   Final diagnoses:  Dizziness  Vertigo     Rx / DC Orders   ED Discharge Orders     None        Note:  This document was prepared using Dragon voice recognition software and may include unintentional dictation errors.   Claudene Rover, MD 11/04/23 [phone]

## 2023-11-04 NOTE — ED Triage Notes (Signed)
 PT BIB daughter with complaints of dizziness that started today with blood pressure higher than normal 165/105 at home. Denies nausea or vomiting. Pt has a loop recorder and was called on Wednesday and told there was a 10 second fast heart beat. Denies chest pain .

## 2023-11-05 MED ORDER — MECLIZINE HCL 12.5 MG PO TABS: 12.5000 mg | ORAL_TABLET | Freq: Three times a day (TID) | ORAL | 0 refills | Status: DC | PRN

## 2023-11-05 NOTE — Discharge Instructions (Addendum)
 Your evaluation in the emergency department was overall reassuring.  We saw no acute findings today and no evidence of stroke.  As discussed, we were unable to get a urine sample here so could not definitively rule out a urinary tract infection--continue to monitor your symptoms.  Please follow-up closely with your primary care provider, and return to the emergency department with any new or worsening symptoms.  I have prescribed you dizziness medication to use as needed in the meantime.

## 2023-11-05 NOTE — ED Provider Notes (Signed)
  Physical Exam  BP (!) 172/85   Pulse 65   Temp 98.5 F (36.9 C) (Oral)   Resp 18   Wt 71.9 kg   SpO2 93%   BMI 26.38 kg/m   Physical Exam  Procedures  Procedures  ED Course / MDM   Clinical Course as of 11/05/23 0325  Sat Nov 04, 2023  2302 S/o from Dr. Claudene: 11F hx prior CVA w/ aphasia p/w 2 days vertigo/dizziness - multiple vascular issues at baseline - 2 days persistent vertigo/dysequilibrium - f/u UA, covid - CTH neg, MRI pending  TO DO: - f/u UA, viral swab, MRI - if unremarkable, potential DC home [MM]  Sun Nov 05, 2023  0102 MRI: IMPRESSION: 1. No acute intracranial abnormality. 2. Age-related cerebral atrophy with moderate to advanced chronic microvascular ischemic disease, with multiple remote lacunar infarcts involving the hemispheric cerebral white matter, bilateral basal ganglia, thalami, and pons. 3. Innumerable chronic micro hemorrhages, most pronounced about the cerebellum and thalami, consistent with chronic poorly controlled hypertension.   [MM]  [phone] Patient unable to provide urine sample here despite many attempts.  Not amenable to straight cath.  Has not been having any urinary symptoms.  Requesting discharge home.  Understands that she could have an underlying UTI that we are not diagnosing at this time.  Will plan for close outpatient follow-up.  ED return precautions in place.  Patient and family agree with plan. [MM]    Clinical Course User Index [MM] [name] LABOR, MD   Medical Decision Making Amount and/or Complexity of Data Reviewed Labs: ordered. Radiology: ordered.          [name] LABOR, MD 11/05/23 [phone]

## 2023-11-05 NOTE — ED Notes (Signed)
 Pt and daughter given DC instructions. Verbalized understanding of medication and follow up care. Pt taken from ED in wheelchair by this RN.

## 2023-11-06 NOTE — Progress Notes (Signed)
 Remote Loop Recorder Transmission

## 2023-11-07 ENCOUNTER — Ambulatory Visit: Admitting: Occupational Therapy

## 2023-11-07 ENCOUNTER — Ambulatory Visit

## 2023-11-07 DIAGNOSIS — M6281 Muscle weakness (generalized): Secondary | ICD-10-CM | POA: Diagnosis not present

## 2023-11-07 DIAGNOSIS — R262 Difficulty in walking, not elsewhere classified: Secondary | ICD-10-CM

## 2023-11-07 DIAGNOSIS — R482 Apraxia: Secondary | ICD-10-CM

## 2023-11-07 DIAGNOSIS — R269 Unspecified abnormalities of gait and mobility: Secondary | ICD-10-CM

## 2023-11-07 DIAGNOSIS — R2689 Other abnormalities of gait and mobility: Secondary | ICD-10-CM | POA: Diagnosis not present

## 2023-11-07 DIAGNOSIS — R2681 Unsteadiness on feet: Secondary | ICD-10-CM

## 2023-11-07 DIAGNOSIS — R278 Other lack of coordination: Secondary | ICD-10-CM

## 2023-11-07 DIAGNOSIS — R4701 Aphasia: Secondary | ICD-10-CM

## 2023-11-07 NOTE — Therapy (Signed)
 OUTPATIENT PHYSICAL THERAPY NEURO TREATMENT   Patient Name: Hannah Kirby MRN: [id number] DOB:Jul 21, 1945, 78 y.o., female Today's Date: 11/07/2023   PCP: Gasper [name] BRAVO, MD REFERRING PROVIDER: Gasper [name] BRAVO, MD  END OF SESSION:    PT End of Session - 11/07/23 1451     Visit Number 7    Number of Visits 25    Date for PT Re-Evaluation 01/09/24    Progress Note Due on Visit 10    PT Start Time 1348    PT Stop Time 1430    PT Time Calculation (min) 42 min    Equipment Utilized During Treatment Gait belt;Oxygen     Activity Tolerance Patient tolerated treatment well    Behavior During Therapy WFL for tasks assessed/performed;Flat affect            Past Medical History:  Diagnosis Date   Acute hemorrhagic colitis 08/08/2019   Anemia    C. difficile colitis 08/09/2019   CAD (coronary artery disease)    a. 02/2006 PCI: BMS x 2 to RCA, cath o/w without significant coronary disease; b. nuclear stress test 07/2014: No ischemia/infarct; c. 11/2017 MV: no isch/infarct, EF 55-65%; d. 03/2020 NSTEMI/PCI: LM nl, LAD min irregs, RI 25, small, LCX nl, RCA 30p/m ISR, 99d (3.0x15 Resolute Onyx DES).   Cataract 02/04/2018   Cerebrovascular accident (CVA) (HCC) 03/18/2023   dysarthria   Chronic bronchitis (HCC)    secondary to cigarette smoking   FHx: allergies    Goiter    Granulomatous disease (HCC)    Hernia    Kidney stone on left side 2013   NSTEMI (non-ST elevated myocardial infarction) (HCC) 03/23/2020   Oxygen  deficiency    Panic attacks    PVC's (premature ventricular contractions)    a. 03/2018 Zio: Occas PVCs (2.5%). Triggered events assoc w/ PVC/PAC.   Retinal detachment, left 03/04/2020   Retinal tear 2020   Stroke (HCC) 10/29/2016   mild left side weakness   Stroke (HCC) 08/01/2019   Tobacco abuse    Past Surgical History:  Procedure Laterality Date   ABDOMINAL HYSTERECTOMY     BREAST SURGERY     CATARACT EXTRACTION W/PHACO Right 03/01/2017   Procedure:  CATARACT EXTRACTION PHACO AND INTRAOCULAR LENS PLACEMENT (IOC) RIGHT DIABETIC;  Surgeon: Mittie Gaskin, MD;  Location: [facility];  Service: Ophthalmology;  Laterality: Right;   CATARACT EXTRACTION W/PHACO Left 03/22/2017   Procedure: CATARACT EXTRACTION PHACO AND INTRAOCULAR LENS PLACEMENT (IOC) LEFT DIABETIC;  Surgeon: Mittie Gaskin, MD;  Location: [facility];  Service: Ophthalmology;  Laterality: Left;  Diabetic - insulin  and oral meds   COLONOSCOPY WITH PROPOFOL  N/A 09/23/2014   Procedure: COLONOSCOPY WITH PROPOFOL ;  Surgeon: Gladis RAYMOND Mariner, MD;  Location: [facility] ENDOSCOPY;  Service: Endoscopy;  Laterality: N/A;   COLONOSCOPY WITH PROPOFOL  N/A 01/11/2018   Procedure: COLONOSCOPY WITH PROPOFOL ;  Surgeon: Mariner Gladis RAYMOND, MD;  Location: [facility] ENDOSCOPY;  Service: Endoscopy;  Laterality: N/A;   COLONOSCOPY WITH PROPOFOL  N/A 04/24/2018   Procedure: COLONOSCOPY WITH PROPOFOL ;  Surgeon: Mariner Gladis RAYMOND, MD;  Location: [facility] ENDOSCOPY;  Service: Endoscopy;  Laterality: N/A;   COLONOSCOPY WITH PROPOFOL  N/A 11/19/2019   Procedure: COLONOSCOPY WITH PROPOFOL ;  Surgeon: Jinny Carmine, MD;  Location: [facility] ENDOSCOPY;  Service: Endoscopy;  Laterality: N/A;   CORONARY ANGIOPLASTY WITH STENT PLACEMENT  2008   CORONARY STENT INTERVENTION N/A 03/24/2020   Procedure: CORONARY STENT INTERVENTION;  Surgeon: Mady Bruckner, MD;  Location: [facility] INVASIVE CV LAB;  Service: Cardiovascular;  Laterality:  N/A;   ESOPHAGOGASTRODUODENOSCOPY (EGD) WITH PROPOFOL  N/A 12/30/2014   Procedure: ESOPHAGOGASTRODUODENOSCOPY (EGD) WITH PROPOFOL ;  Surgeon: Gladis RAYMOND Mariner, MD;  Location: [facility] ENDOSCOPY;  Service: Endoscopy;  Laterality: N/A;   ESOPHAGOGASTRODUODENOSCOPY (EGD) WITH PROPOFOL  N/A 07/19/2016   Procedure: ESOPHAGOGASTRODUODENOSCOPY (EGD) WITH PROPOFOL ;  Surgeon: Mariner Gladis RAYMOND, MD;  Location: [facility] ENDOSCOPY;  Service: Endoscopy;  Laterality: N/A;   ESOPHAGOGASTRODUODENOSCOPY (EGD)  WITH PROPOFOL  N/A 04/24/2018   Procedure: ESOPHAGOGASTRODUODENOSCOPY (EGD) WITH PROPOFOL ;  Surgeon: Mariner Gladis RAYMOND, MD;  Location: [facility] ENDOSCOPY;  Service: Endoscopy;  Laterality: N/A;   EYE SURGERY  cataracts, detached retina   hysterectomy (other)     LEFT HEART CATH AND CORONARY ANGIOGRAPHY N/A 03/24/2020   Procedure: LEFT HEART CATH AND CORONARY ANGIOGRAPHY;  Surgeon: Mady Bruckner, MD;  Location: [facility] INVASIVE CV LAB;  Service: Cardiovascular;  Laterality: N/A;   Patient Active Problem List   Diagnosis Date Noted   Stroke (HCC) 08/05/2023   Depression with anxiety 08/05/2023   Overweight (BMI 25.0-29.9) 08/05/2023   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) 03/17/2023   Dizziness 02/23/2023   Restrictive airway disease 07/21/2020   Partial thickness rotator cuff tear 03/20/2020   Shoulder pain 03/05/2020   Ataxia 02/28/2020   Difficulty walking 02/28/2020   Physical deconditioning 02/28/2020   Chronic diastolic congestive heart failure (HCC) 11/20/2019   Polyp of ascending colon    Hypokalemia 08/08/2019   TIA (transient ischemic attack) 08/01/2019   Benign hypertensive kidney disease with chronic kidney disease 04/25/2019   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   Chronic, continuous use of opioids 03/26/2018   History of adenomatous polyp of colon 01/22/2018   Diverticulosis of colon 01/22/2018   History of CVA (cerebrovascular accident) 11/08/2016   Weakness of left upper extremity 10/30/2016   AAA (abdominal aortic aneurysm) without rupture (HCC) 08/12/2016   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   Stage 3b chronic kidney disease (HCC) 07/27/2015   Type 2 diabetes mellitus with diabetic chronic kidney disease (HCC) 10/28/2014   Solitary pulmonary nodule on lung CT 08/20/2014   Allergic rhinitis 07/28/2014   Acid reflux 07/28/2014   Chronic obstructive pulmonary disease (HCC) 07/28/2014   Granuloma annulare    Back  pain 11/12/2013   Lumbar scoliosis 10/30/2013   Lumbar radiculopathy 10/30/2013   Lumbar canal stenosis 10/30/2013   Proteinuria 10/22/2013   Smokes with greater than 30 pack year history 03/21/2011   Edema 08/18/2010   Hyperlipidemia 03/25/2009   Coronary artery disease 03/25/2009   Primary hypertension 03/25/2009    ONSET DATE: 08/05/23  REFERRING DIAG:  Z86.73 (ICD-10-CM) - History of CVA (cerebrovascular accident)  R27.0 (ICD-10-CM) - Ataxia    THERAPY DIAG:   Muscle weakness (generalized)  Other lack of coordination  Difficulty in walking, not elsewhere classified  Unsteadiness on feet  Abnormality of gait and mobility  Other abnormalities of gait and mobility  Rationale for Evaluation and Treatment: Rehabilitation  SUBJECTIVE:  SUBJECTIVE STATEMENT:  Pt was in the ED over the weekend and daughter reports that the pt may have overdone it last visit.  Pt was not feeling great after therapy on Thursday, felt dizzy Friday, and even dizzier and having a panic attack on Saturday which caused them to take her to the ED.     Pt accompanied by: self and family member, daughter  PERTINENT HISTORY:  PMH: of CVA, COPD, Dysarthria, Ataxia, TIA, T2DM, Back pain, scoliosis   PAIN:  Are you having pain? Yes: NPRS scale: 3/10 Pain location: low back and L hip Pain description: achy Aggravating factors: just hurts all the time. Relieving factors: hydrocodone  at night  PRECAUTIONS: Other: Pt has a looper to see if pt has a-fib  RED FLAGS: Abdominal aneurysm: Yes: pt reports having an abdominal aneurism as found by Dr. Marea.   WEIGHT BEARING RESTRICTIONS: No  FALLS: Has patient fallen in last 6 months? Yes. Number of falls 1.  Pt reports losing balance, and got caught up in cords that were  on the floor when she was trying to close the blinds.  LIVING ENVIRONMENT: Lives with: lives with their spouse Lives in: House/apartment Stairs: Yes: External: 1 steps; none Has following equipment at home: Single point cane, Walker - 2 wheeled, Environmental consultant - 4 wheeled, shower chair, and Grab bars  PLOF: Independent  PATIENT GOALS: Pt is wanting to gain independence.  Pt is wanting to improve endurance levels and improving speech with SLP.  OBJECTIVE:  Note: Objective measures were completed at Evaluation unless otherwise noted.  DIAGNOSTIC FINDINGS:   EXAM: MRI HEAD WITHOUT CONTRAST   MRA HEAD WITHOUT CONTRAST   MRA NECK WITHOUT AND WITH CONTRAST   IMPRESSION: 1. Small acute infarcts in the left frontal lobe, right parietal lobe, and left cerebellum. 2. Extensive chronic small vessel ischemic disease. 3. 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 4. Unchanged severe right A3 stenosis or segmental occlusion. 5. Moderate left P2 stenosis. 6. Limited assessment of the left vertebral artery origin, otherwise negative neck MRA.   COGNITION: Overall cognitive status: Within functional limits for tasks assessed   SENSATION: WFL, however pt notes having some numbness in the R hand.  COORDINATION: WFL  POSTURE: rounded shoulders, forward head, decreased lumbar lordosis, increased thoracic kyphosis, posterior pelvic tilt, and flexed trunk    LOWER EXTREMITY MMT:    MMT Right Eval Left Eval  Hip flexion 4 4  Hip extension    Hip abduction 4 4-  Hip adduction 4 4-  Hip internal rotation    Hip external rotation    Knee flexion 4 4-  Knee extension 4 4-  Ankle dorsiflexion 4 4-  Ankle plantarflexion    Ankle inversion    Ankle eversion    (Blank rows = not tested)  BED MOBILITY:  Not tested  FUNCTIONAL TESTS:  5 times sit to stand: 24.36 sec Timed up and go (TUG): 23.94 sec with walker 2 minute walk test: TBD 10 meter walk test: 20.33 sec with  walker Berg Balance Scale: TBD  PATIENT SURVEYS:   Stroke Impact Scale 16 (Copyrighted instrument, [facility])  In the past 2 weeks, how difficult was it to...  Rating Scale 5 = Not difficult at all 4 = A little difficult 3 = Somewhat difficult 2 = Very difficult 1 = Could not do at all  a. Dress the top part of your body? 4  b. Bathe yourself? 2  c.  Get to the toilet on time? 5  d. Control your bladder (not have an accident)? 5  e. Control your bowels (not have an accident)? 5  f. Stand without losing balance? 3  g. Go shopping? 2  h. Do heavy household chores (e.g. vacuum, laundry or  yard work)? 1  i. Stay sitting without losing your balance? 3  j. Walk without losing your balance? 2  k. Move from a bed to a chair? 3  l. Walk fast? 1  m. Climb one flight of stairs? 1  n. Walk one block? 1  o. Get in and out of a car? 2  p. Carry heavy objects (e.g. bag of groceries) with your  affected hand? 1  Sum:  41/80  MDC (Minimal Detectable Change) is >/=8                                                                                                                                 TREATMENT DATE: 11/07/23   Pt arrived to session in transport chair, daughter present.   SpO2: 89-92% upon arrival to the clinic with room air; seated.    TherEx: To improve strength, endurance, mobility, and function of specific targeted muscle groups or improve joint range of motion or improve muscle flexibility  Seated hamstring curls with GTB, 2x15 each LE Seated resisted hip abduction with GTB, 2x15 each side Seated LAQ'd with 4# AW donned, 2x15 each LE Seated marches with 4# AW donned, 2x15 each LE      PATIENT EDUCATION: Education details: Pt educated on role of PT and services provided during current POC, along with prognosis and information about the clinic.  Person educated: Patient and Child(ren) Education method: Explanation Education  comprehension: verbalized understanding and returned demonstration  HOME EXERCISE PROGRAM:  Access Code: [email or web address] URL: [email or web address] Date: 10/17/2023 Prepared by: Sidra Simpers  Exercises - Standing March with Counter Support  - 1 x daily - 3-4 x weekly - 3 sets - 10 reps - Standing Knee Flexion with Unilateral Counter Support  - 1 x daily - 7 x weekly - 3 sets - 10 reps - Standing Hip Extension with Unilateral Counter Support  - 1 x daily - 7 x weekly - 3 sets - 10 reps - Standing Hip Abduction with Unilateral Counter Support  - 1 x daily - 7 x weekly - 3 sets - 10 reps - Heel Toe Raises with Unilateral Counter Support  - 1 x daily - 7 x weekly - 3 sets - 10 reps  GOALS: Goals reviewed with patient? Yes  SHORT TERM GOALS: Target date: 11/15/2023  Pt will be independent with HEP in order to demonstrate increased ability to perform tasks related to occupation/hobbies. Baseline: pt given HEP at eval Goal status: INITIAL  LONG TERM GOALS: Target date: 01/10/2024  1.  Patient (> 62 years old) will complete five times sit to stand test in < 15 seconds indicating an  increased LE strength and improved balance. Baseline: 24.36 sec Goal status: INITIAL  2.  Patient will improve SIS 16 score to 55   to demonstrate statistically significant improvement in mobility and quality of life as it relates to their CVA.  Baseline: 41 Goal status: INITIAL   3.  Patient will increase Berg Balance score by > 6 points to demonstrate decreased fall risk during functional activities. Baseline: 33/56 Goal status: INITIAL   4.  Patient will reduce timed up and go to <11 seconds to reduce fall risk and demonstrate improved transfer/gait ability. Baseline: 23.94 sec with walker Goal status: INITIAL  5.  Patient will increase 10 meter walk test to >1.26m/s as to improve gait speed for better community ambulation and to reduce fall risk. Baseline: 20.33 sec with walker Goal status:  INITIAL  6.  Patient will increase 2 minute walk test distance by 50 ft or greater for progression to community ambulator and demonstrate improved gait ability  Baseline: 275'; 83.82 m Goal status: INITIAL     ASSESSMENT:  CLINICAL IMPRESSION:  Pt requested to be doing light exercises today due to still being fatigued from over the weekend.  Pt also noted to be more fatigued due to having speech and OT prior to this session.  Pt elected to do seated level exercises, and was able to maintain adequate oxygen  levels throughout the session.  Pt performed the exercises with good technique and then transitioned back to the chair at the end of the session.   Pt will continue to benefit from skilled therapy to address remaining deficits in order to improve overall QoL and return to PLOF.      OBJECTIVE IMPAIRMENTS: Abnormal gait, decreased activity tolerance, decreased balance, decreased endurance, decreased knowledge of use of DME, decreased mobility, difficulty walking, decreased strength, impaired sensation, and pain.   ACTIVITY LIMITATIONS: carrying, lifting, bending, standing, squatting, stairs, transfers, bathing, toileting, dressing, hygiene/grooming, and locomotion level  PARTICIPATION LIMITATIONS: meal prep, cleaning, laundry, community activity, and yard work  PERSONAL FACTORS: Age and 3+ comorbidities:  PMH: of CVA, COPD, Dysarthria, Ataxia, TIA, T2DM, Back pain, scoliosis are also affecting patient's functional outcome.   REHAB POTENTIAL: Good  CLINICAL DECISION MAKING: Evolving/moderate complexity  EVALUATION COMPLEXITY: Moderate  PLAN:  PT FREQUENCY: 2x/week  PT DURATION: 12 weeks  PLANNED INTERVENTIONS: 97750- Physical Performance Testing, 97110-Therapeutic exercises, 97530- Therapeutic activity, 97112- Neuromuscular re-education, 97535- Self Care, [id number]- Manual therapy, [id number]- Gait training, Balance training, and Vestibular training  PLAN FOR NEXT SESSION:    Monitor  SPO2 and get portable oxygen  if necessary  Endurance, balance, strength ( L>R), updated HEP, gait training  -Limiting seated rest breaks as tolerated to challenge endurance   Fonda Simpers, PT, DPT Physical Therapist - [facility]  11/07/23, 2:52 PM

## 2023-11-07 NOTE — Therapy (Signed)
 OUTPATIENT SPEECH LANGUAGE PATHOLOGY APHASIA TREATMENT   Patient Name: Hannah Kirby MRN: [id number] DOB:10-Apr-1945, 78 y.o., female Today's Date: 11/07/2023  PCP: [name] Perry, MD REFERRING PROVIDER: [name] Perry, MD   End of Session - 11/07/23 1506     Visit Number 7    Number of Visits 23    Date for SLP Re-Evaluation 01/09/24    Progress Note Due on Visit 10    SLP Start Time 1400    SLP Stop Time  1445    SLP Time Calculation (min) 45 min    Activity Tolerance Patient tolerated treatment well          Past Medical History:  Diagnosis Date   Acute hemorrhagic colitis 08/08/2019   Anemia    C. difficile colitis 08/09/2019   CAD (coronary artery disease)    a. 02/2006 PCI: BMS x 2 to RCA, cath o/w without significant coronary disease; b. nuclear stress test 07/2014: No ischemia/infarct; c. 11/2017 MV: no isch/infarct, EF 55-65%; d. 03/2020 NSTEMI/PCI: LM nl, LAD min irregs, RI 25, small, LCX nl, RCA 30p/m ISR, 99d (3.0x15 Resolute Onyx DES).   Cataract 02/04/2018   Cerebrovascular accident (CVA) (HCC) 03/18/2023   dysarthria   Chronic bronchitis (HCC)    secondary to cigarette smoking   FHx: allergies    Goiter    Granulomatous disease (HCC)    Hernia    Kidney stone on left side 2013   NSTEMI (non-ST elevated myocardial infarction) (HCC) 03/23/2020   Oxygen  deficiency    Panic attacks    PVC's (premature ventricular contractions)    a. 03/2018 Zio: Occas PVCs (2.5%). Triggered events assoc w/ PVC/PAC.   Retinal detachment, left 03/04/2020   Retinal tear 2020   Stroke (HCC) 10/29/2016   mild left side weakness   Stroke (HCC) 08/01/2019   Tobacco abuse    Past Surgical History:  Procedure Laterality Date   ABDOMINAL HYSTERECTOMY     BREAST SURGERY     CATARACT EXTRACTION W/PHACO Right 03/01/2017   Procedure: CATARACT EXTRACTION PHACO AND INTRAOCULAR LENS PLACEMENT (IOC) RIGHT DIABETIC;  Surgeon: Mittie Gaskin, MD;  Location: [facility];   Service: Ophthalmology;  Laterality: Right;   CATARACT EXTRACTION W/PHACO Left 03/22/2017   Procedure: CATARACT EXTRACTION PHACO AND INTRAOCULAR LENS PLACEMENT (IOC) LEFT DIABETIC;  Surgeon: Mittie Gaskin, MD;  Location: [facility];  Service: Ophthalmology;  Laterality: Left;  Diabetic - insulin  and oral meds   COLONOSCOPY WITH PROPOFOL  N/A 09/23/2014   Procedure: COLONOSCOPY WITH PROPOFOL ;  Surgeon: Gladis RAYMOND Mariner, MD;  Location: [facility] ENDOSCOPY;  Service: Endoscopy;  Laterality: N/A;   COLONOSCOPY WITH PROPOFOL  N/A 01/11/2018   Procedure: COLONOSCOPY WITH PROPOFOL ;  Surgeon: Mariner Gladis RAYMOND, MD;  Location: [facility] END[facility]COPY;  Service: Endoscopy;  Laterality: N/A;   COLONOSCOPY WITH PROPOFOL  N/A 04/24/2018   Procedure: COLONOSCOPY WITH PROPOFOL ;  Surgeon: Mariner Gladis RAYMOND, MD;  Location: [facility] ENDOSCOPY;  Service: Endoscopy;  Laterality: N/A;   COLONOSCOPY WITH PROPOFOL  N/A 11/19/2019   Procedure: COLONOSCOPY WITH PROPOFOL ;  Surgeon: Jinny Carmine, MD;  Location: [facility] ENDOSCOPY;  Service: Endoscopy;  Laterality: N/A;   CORONARY ANGIOPLASTY WITH STENT PLACEMENT  2008   CORONARY STENT INTERVENTION N/A 03/24/2020   Procedure: CORONARY STENT INTERVENTION;  Surgeon: Mady Bruckner, MD;  Location: [facility] INVASIVE CV LAB;  Service: Cardiovascular;  Laterality: N/A;   ESOPHAGOGASTRODUODENOSCOPY (EGD) WITH PROPOFOL  N/A 12/30/2014   Procedure: ESOPHAGOGASTRODUODENOSCOPY (EGD) WITH PROPOFOL ;  Surgeon: Gladis RAYMOND Mariner, MD;  Location: [facility] ENDOSCOPY;  Service: Endoscopy;  Laterality: N/A;   ESOPHAGOGASTRODUODENOSCOPY (EGD) WITH PROPOFOL  N/A 07/19/2016   Procedure: ESOPHAGOGASTRODUODENOSCOPY (EGD) WITH PROPOFOL ;  Surgeon: Gaylyn Gladis PENNER, MD;  Location: [facility] ENDOSCOPY;  Service: Endoscopy;  Laterality: N/A;   ESOPHAGOGASTRODUODENOSCOPY (EGD) WITH PROPOFOL  N/A 04/24/2018   Procedure: ESOPHAGOGASTRODUODENOSCOPY (EGD) WITH PROPOFOL ;  Surgeon: Gaylyn Gladis PENNER, MD;  Location: [facility]  ENDOSCOPY;  Service: Endoscopy;  Laterality: N/A;   EYE SURGERY  cataracts, detached retina   hysterectomy (other)     LEFT HEART CATH AND CORONARY ANGIOGRAPHY N/A 03/24/2020   Procedure: LEFT HEART CATH AND CORONARY ANGIOGRAPHY;  Surgeon: Mady Bruckner, MD;  Location: [facility] INVASIVE CV LAB;  Service: Cardiovascular;  Laterality: N/A;   Patient Active Problem List   Diagnosis Date Noted   Stroke (HCC) 08/05/2023   Depression with anxiety 08/05/2023   Overweight (BMI 25.0-29.9) 08/05/2023   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) 03/17/2023   Dizziness 02/23/2023   Restrictive airway disease 07/21/2020   Partial thickness rotator cuff tear 03/20/2020   Shoulder pain 03/05/2020   Ataxia 02/28/2020   Difficulty walking 02/28/2020   Physical deconditioning 02/28/2020   Chronic diastolic congestive heart failure (HCC) 11/20/2019   Polyp of ascending colon    Hypokalemia 08/08/2019   TIA (transient ischemic attack) 08/01/2019   Benign hypertensive kidney disease with chronic kidney disease 04/25/2019   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   Chronic, continuous use of opioids 03/26/2018   History of adenomatous polyp of colon 01/22/2018   Diverticulosis of colon 01/22/2018   History of CVA (cerebrovascular accident) 11/08/2016   Weakness of left upper extremity 10/30/2016   AAA (abdominal aortic aneurysm) without rupture (HCC) 08/12/2016   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   Stage 3b chronic kidney disease (HCC) 07/27/2015   Type 2 diabetes mellitus with diabetic chronic kidney disease (HCC) 10/28/2014   Solitary pulmonary nodule on lung CT 08/20/2014   Allergic rhinitis 07/28/2014   Acid reflux 07/28/2014   Chronic obstructive pulmonary disease (HCC) 07/28/2014   Granuloma annulare    Back pain 11/12/2013   Lumbar scoliosis 10/30/2013   Lumbar radiculopathy 10/30/2013   Lumbar canal stenosis 10/30/2013   Proteinuria 10/22/2013    Smokes with greater than 30 pack year history 03/21/2011   Edema 08/18/2010   Hyperlipidemia 03/25/2009   Coronary artery disease 03/25/2009   Primary hypertension 03/25/2009    ONSET DATE: 03/17/23 (admitted with stroke), 05/23/23 (referral date)  REFERRING DIAG:  R41.841 (ICD-10-CM) - Cognitive communication deficit  I63.9 (ICD-10-CM) - CVA (cerebral vascular accident) (HCC)  R47.02 (ICD-10-CM) - Dysphasia    THERAPY DIAG:  Apraxia of speech  Aphasia  Rationale for Evaluation and Treatment Rehabilitation  SUBJECTIVE:   SUBJECTIVE STATEMENT: Pt alert, pleasant, and cooperative.  Pt accompanied by: self and family member  PERTINENT HISTORY:  Pt is a 78 y.o. female who presents for communication evaluation in setting on stroke. Pt in ED 1/24-1/25/25. Pt d/c'd home with HH ST. PMHx significant of   HTN, CKD stage IIIB, DMT2 on insulin , CAD, chronic smoking, chronic respiratory failure with COPD and on home oxygen , anxiety, HLD. MRI 03/17/23 1. Punctate foci of restricted diffusion in the left posterior  frontal white matter, most likely acute infarcts.  2. Numerous foci of hemosiderin deposition, which are seen in the  deep gray structures but also in the bilateral cerebral hemispheres,  with 1 new focus compared to 02/20/2023. 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   PAIN:  Are you having pain? No  FALLS: defer to PT  LIVING ENVIRONMENT: Lives with: lives with their spouse Lives in: House/apartment  PLOF:  Level of assistance: Independent with ADLs Employment: Retired   PATIENT GOALS    for communication to improve   OBJECTIVE:  TODAY'S TREATMENT:  Animator call/interview with [employer] Supported conversation by reviewing possible questions and providing hierarchical cues as needed. Pt able to answer questions from unfamiliar speaker >90%  of the time without assistance. Mild difficulty with open-ended questions and continued difficulty with longer utterance length. Pt provided with additional information re: aphasia programming/groups as pt/family trainings. Reviewed importance of social communication in aphasia recovery and overall life participation. Pt willing to fill out TAP paperwork during upcoming sessions.      PATIENT EDUCATION: Education details: as above Person educated: Patient and Child Education method: Explanation Education comprehension: verbalized understanding and needs further education  HOME EXERCISE PROGRAM:    SGD home practice worksheet    GOALS:  Goals reviewed with patient? Yes  SHORT TERM GOALS: Target date: 10 sessions  Pt will participate in further assessment of functional reading/writing. Baseline: Goal status: INITIAL  2.  With Moderate A, patient will complete a semantic feature analysis with at least 2 relevant features for 8/10 target words to improve word-finding skills.  Baseline:  Goal status: INITIAL  3.  With Maximal A, patient will generate sentences with 3 or more words in response to a situation at 80% accuracy in order to increase ability to communicate basic wants and needs.  Baseline:  Goal status: INITIAL  4.   With Maximal A,  pt will follow 2-step commands for improved participation in ADLs/iADLs with max cueing. Baseline:  Goal status: INITIAL  5.  Pt will repeat short sentences (<4 words) with good approximations 80% of the time with max cueing. Baseline:  Goal status: INITIAL    LONG TERM GOALS: Target date: 12 weeks  Pt will report a subjective improvement in communication per PROM.  Baseline: CES 12/32 on 8/26 Goal status: INITIAL  2.  With Min A, patient/family will demonstrate understanding of the following concepts: aphasia, spontaneous recovery, communication vs conversation, strengths/strategies to promote success, local resources in order to  increase patient's participation in medical care.    Baseline:  Goal status: INITIAL   ASSESSMENT:  CLINICAL IMPRESSION: Pt is a 78 y.o. female who presents for communication treatment in setting on stroke. Pt known to SLP services and was receiving OP ST prior to most recent stroke 08/05/23. Pt d/c'd home with [facility] ST who is working on securing SGD for pt. PMHx significant of  HTN, CKD stage IIIB, DMT2 on insulin , CAD, chronic smoking, chronic respiratory failure with COPD and on home oxygen , anxiety, HLD. Assessment this episode completed via functional/dynamic means including Western Aphasia Battery Revised (WAB-R) and PROM (Communication Effectiveness Scale). 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 See details of tx session above. Recommend course of ST targeting functional communication, further assessment of functional reading/writing, and pt/caregiver training to help promote QoL and overall life participation.   OBJECTIVE IMPAIRMENTS include expressive language, receptive language, and aphasia. These impairments are limiting patient from ADLs/IADLs and effectively communicating at home and in community. Factors affecting potential to achieve goals and functional outcome are severity of impairments. Patient will benefit from skilled SLP services to address above impairments and improve overall function.  REHAB POTENTIAL: Good  PLAN: SLP FREQUENCY: 2x/week  SLP DURATION: 12 weeks  PLANNED INTERVENTIONS: Language facilitation, Cueing hierachy, Internal/external aids, Functional tasks, Multimodal communication approach, SLP instruction and feedback, Compensatory strategies, and Patient/family education    Delon Bangs, M.S., CCC-SLP Speech-Language Pathologist [place] [facility] [place] [phone] [place])  [facility] Outpatient Rehabilitation at  [facility] [address] [place], [place], [address] Phone: [phone]   Fax:  [phone]

## 2023-11-08 NOTE — Therapy (Signed)
 OUTPATIENT OCCUPATIONAL THERAPY NEURO TREATMENT NOTE  Patient Name: Hannah Kirby MRN: [id number] DOB:April 29, 1945, 78 y.o., female Today's Date: 11/08/2023  PCP: Dr. [name] Perry REFERRING PROVIDER: Dr. [name] Perry  END OF SESSION:  OT End of Session - 11/08/23 0908     Visit Number 3    Number of Visits 24    Date for OT Re-Evaluation 01/22/24    OT Start Time 1320    OT Stop Time 1400    OT Time Calculation (min) 40 min    Activity Tolerance Patient tolerated treatment well    Behavior During Therapy [facility] for tasks assessed/performed;Flat affect          Past Medical History:  Diagnosis Date   Acute hemorrhagic colitis 08/08/2019   Anemia    C. difficile colitis 08/09/2019   CAD (coronary artery disease)    a. 02/2006 PCI: BMS x 2 to RCA, cath o/w without significant coronary disease; b. nuclear stress test 07/2014: No ischemia/infarct; c. 11/2017 MV: no isch/infarct, EF 55-65%; d. 03/2020 NSTEMI/PCI: LM nl, LAD min irregs, RI 25, small, LCX nl, RCA 30p/m ISR, 99d (3.0x15 Resolute Onyx DES).   Cataract 02/04/2018   Cerebrovascular accident (CVA) (HCC) 03/18/2023   dysarthria   Chronic bronchitis (HCC)    secondary to cigarette smoking   FHx: allergies    Goiter    Granulomatous disease (HCC)    Hernia    Kidney stone on left side 2013   NSTEMI (non-ST elevated myocardial infarction) (HCC) 03/23/2020   Oxygen  deficiency    Panic attacks    PVC's (premature ventricular contractions)    a. 03/2018 Zio: Occas PVCs (2.5%). Triggered events assoc w/ PVC/PAC.   Retinal detachment, left 03/04/2020   Retinal tear 2020   Stroke (HCC) 10/29/2016   mild left side weakness   Stroke (HCC) 08/01/2019   Tobacco abuse    Past Surgical History:  Procedure Laterality Date   ABDOMINAL HYSTERECTOMY     BREAST SURGERY     CATARACT EXTRACTION W/PHACO Right 03/01/2017   Procedure: CATARACT EXTRACTION PHACO AND INTRAOCULAR LENS PLACEMENT (IOC) RIGHT DIABETIC;  Surgeon: Mittie Gaskin, MD;  Location: [facility];  Service: Ophthalmology;  Laterality: Right;   CATARACT EXTRACTION W/PHACO Left 03/22/2017   Procedure: CATARACT EXTRACTION PHACO AND INTRAOCULAR LENS PLACEMENT (IOC) LEFT DIABETIC;  Surgeon: Mittie Gaskin, MD;  Location: [facility];  Service: Ophthalmology;  Laterality: Left;  Diabetic - insulin  and oral meds   COLONOSCOPY WITH PROPOFOL  N/A 09/23/2014   Procedure: COLONOSCOPY WITH PROPOFOL ;  Surgeon: Gladis RAYMOND Mariner, MD;  Location: [facility] ENDOSCOPY;  Service: Endoscopy;  Laterality: N/A;   COLONOSCOPY WITH PROPOFOL  N/A 01/11/2018   Procedure: COLONOSCOPY WITH PROPOFOL ;  Surgeon: Mariner Gladis RAYMOND, MD;  Location: [facility] ENDOSCOPY;  Service: Endoscopy;  Laterality: N/A;   COLONOSCOPY WITH PROPOFOL  N/A 04/24/2018   Procedure: COLONOSCOPY WITH PROPOFOL ;  Surgeon: Mariner Gladis RAYMOND, MD;  Location: [facility] ENDOSCOPY;  Service: Endoscopy;  Laterality: N/A;   COLONOSCOPY WITH PROPOFOL  N/A 11/19/2019   Procedure: COLONOSCOPY WITH PROPOFOL ;  Surgeon: Jinny Carmine, MD;  Location: [facility] ENDOSCOPY;  Service: Endoscopy;  Laterality: N/A;   CORONARY ANGIOPLASTY WITH STENT PLACEMENT  2008   CORONARY STENT INTERVENTION N/A 03/24/2020   Procedure: CORONARY STENT INTERVENTION;  Surgeon: Mady Bruckner, MD;  Location: [facility] INVASIVE CV LAB;  Service: Cardiovascular;  Laterality: N/A;   ESOPHAGOGASTRODUODENOSCOPY (EGD) WITH PROPOFOL  N/A 12/30/2014   Procedure: ESOPHAGOGASTRODUODENOSCOPY (EGD) WITH PROPOFOL ;  Surgeon: Gladis RAYMOND Mariner, MD;  Location: [facility]  ENDOSCOPY;  Service: Endoscopy;  Laterality: N/A;   ESOPHAGOGASTRODUODENOSCOPY (EGD) WITH PROPOFOL  N/A 07/19/2016   Procedure: ESOPHAGOGASTRODUODENOSCOPY (EGD) WITH PROPOFOL ;  Surgeon: Gaylyn Gladis PENNER, MD;  Location: [facility] ENDOSCOPY;  Service: Endoscopy;  Laterality: N/A;   ESOPHAGOGASTRODUODENOSCOPY (EGD) WITH PROPOFOL  N/A 04/24/2018   Procedure: ESOPHAGOGASTRODUODENOSCOPY (EGD) WITH PROPOFOL ;  Surgeon:  Gaylyn Gladis PENNER, MD;  Location: [facility] ENDOSCOPY;  Service: Endoscopy;  Laterality: N/A;   EYE SURGERY  cataracts, detached retina   hysterectomy (other)     LEFT HEART CATH AND CORONARY ANGIOGRAPHY N/A 03/24/2020   Procedure: LEFT HEART CATH AND CORONARY ANGIOGRAPHY;  Surgeon: Mady Bruckner, MD;  Location: [facility] INVASIVE CV LAB;  Service: Cardiovascular;  Laterality: N/A;   Patient Active Problem List   Diagnosis Date Noted   Stroke (HCC) 08/05/2023   Depression with anxiety 08/05/2023   Overweight (BMI 25.0-29.9) 08/05/2023   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) 03/17/2023   Dizziness 02/23/2023   Restrictive airway disease 07/21/2020   Partial thickness rotator cuff tear 03/20/2020   Shoulder pain 03/05/2020   Ataxia 02/28/2020   Difficulty walking 02/28/2020   Physical deconditioning 02/28/2020   Chronic diastolic congestive heart failure (HCC) 11/20/2019   Polyp of ascending colon    Hypokalemia 08/08/2019   TIA (transient ischemic attack) 08/01/2019   Benign hypertensive kidney disease with chronic kidney disease 04/25/2019   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   Chronic, continuous use of opioids 03/26/2018   History of adenomatous polyp of colon 01/22/2018   Diverticulosis of colon 01/22/2018   History of CVA (cerebrovascular accident) 11/08/2016   Weakness of left upper extremity 10/30/2016   AAA (abdominal aortic aneurysm) without rupture (HCC) 08/12/2016   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   Stage 3b chronic kidney disease (HCC) 07/27/2015   Type 2 diabetes mellitus with diabetic chronic kidney disease (HCC) 10/28/2014   Solitary pulmonary nodule on lung CT 08/20/2014   Allergic rhinitis 07/28/2014   Acid reflux 07/28/2014   Chronic obstructive pulmonary disease (HCC) 07/28/2014   Granuloma annulare    Back pain 11/12/2013   Lumbar scoliosis 10/30/2013   Lumbar radiculopathy 10/30/2013   Lumbar canal  stenosis 10/30/2013   Proteinuria 10/22/2013   Smokes with greater than 30 pack year history 03/21/2011   Edema 08/18/2010   Hyperlipidemia 03/25/2009   Coronary artery disease 03/25/2009   Primary hypertension 03/25/2009   ONSET DATE: 08/05/2023  REFERRING DIAG: M70.101 (ICD-10-CM) - Weakness of left upper extremity   THERAPY DIAG:  Muscle weakness (generalized)  Rationale for Evaluation and Treatment: Rehabilitation  SUBJECTIVE:  SUBJECTIVE STATEMENT: Pt. Reports feeling better, after having had ER visit since the last therapy session. Pt accompanied by: family member: daughter, Harrie  PERTINENT HISTORY: Daughter present and provided hx.  Daughter reports pt has had 4 strokes, beginning in 2018, 2019, January of 2025 and the most recent in June of 2025.  Pt had been participating in [facility] since the most recent CVA, and is now eager to transition to outpatient services where PT/OT/SLP have been ordered. Per MEDICAL RECORD NUMBERPMH: of CVA, COPD, Dysarthria, Ataxia, TIA, T2DM, Back pain, scoliosis   PRECAUTIONS: Fall, Loop recorder for Afib  WEIGHT BEARING RESTRICTIONS: No  PAIN:  11/08/23: Same as below 11/02/23: same as eval below Are you having pain? Yes: NPRS scale: 3/10, can get up to 7-8/10 Pain location: low back and L hip Pain description: achy, sharp, dull Aggravating factors: prolonged standing  Relieving factors: sitting, pain meds, heat/ice   FALLS:  Has patient fallen in last 6 months? Yes. Number of falls 1, tripping over cords  LIVING ENVIRONMENT: Lives with: lives with their spouse Lives in: 1 level home Stairs: Yes: External: 1 steps; none Has following equipment at home: walk in shower, comfort height toilet   PLOF: Prior to Jan of this year, pt was managing all home management tasks, was able to drive, and was caring for spouse   PATIENT GOALS: Strengthening the left arm   OBJECTIVE:  Note: Objective measures were completed at Evaluation unless otherwise  noted.  HAND DOMINANCE: Right  ADLs: Overall ADLs: Daughter is pt's primary caregiver and lives near by Transfers/ambulation related to ADLs: occasional help with using walker at home, depending on balance on a given day Eating: indep/able to cut food  Grooming: bimanual grooming without difficulty UB Dressing: extra time for clothing fasteners, but able to gather clothing with RW LB Dressing: extra time with clothing fasteners Toileting: occasional help to walk to the bathroom, but modified indep within the bathroom for toileting tasks Bathing: direct supv for bathing using walkin in shower, shower chair, and grab bar; gets hair washed weekly at salon Tub Shower transfers: supv for walk in shower Equipment: RW, SBQC, shower chair, grab bars  IADLs: Shopping: daughter currently managing Light housekeeping: daughter currently managing; can start laundry on a good day.  Meal Prep: starting to participate in light meal prep, including making eggs on stove top Community mobility: daughter assists with all transportation; pt using transport chair to reach therapy gym today Medication management: daughter sets up pills 2 weeks at a time  Financial management: pt is participating in bill paying with daughter's assistance; most are online Handwriting: 100% legible  POSTURE COMMENTS:  rounded shoulders, forward head, increased thoracic kyphosis  ACTIVITY TOLERANCE: Activity tolerance: to be further assessed within functional contexts  FUNCTIONAL OUTCOME MEASURES: TBD  UPPER EXTREMITY ROM:  BUEs WFL for daily tasks  UPPER EXTREMITY MMT:  Hx of R rotator cuff tear 4-5 years ago with residual weakness, non-surgical;    MMT Right eval Left eval  Shoulder flexion 3+ 4  Shoulder abduction 3+ 4  Shoulder adduction    Shoulder extension    Shoulder internal rotation 3+ 4-  Shoulder external rotation 3+ 3+  Elbow flexion 5 5  Elbow extension 5 5  Wrist flexion 4+ 5  Wrist extension  4+ 5  Wrist ulnar deviation    Wrist radial deviation    Wrist pronation    Wrist supination    (Blank rows = not tested)   *Most recent CVA affecting L non-dominant side, with LUE presenting with increased strength as compared to dominant/unaffected arm.  Assume RUE weaker from hx of rotator cuff tear, or possibly previous CVAs.  Pt does present with mild LUE ataxia, and bilat hand weakness.  HAND FUNCTION: Grip strength: Right: 32 lbs; Left: 30 lbs, Lateral pinch: Right: 8 lbs, Left: 6 lbs, and 3 point pinch: Right: 7 lbs, Left: 6 lbs  COORDINATION: Finger Nose Finger test: LUE mild ataxia  9 Hole Peg test: Right: 29 sec; Left: 47 sec  SENSATION: Pt reports intermittent numbness in the R thumb, IF, and LF  EDEMA: No visible edema  MUSCLE TONE: RUE: Within functional limits and LUE: Within functional limits  COGNITION: Overall cognitive status: Impaired; see SLP eval for details  VISION: Pt reports L eye feels more blurry since first stroke in 2018; pt has reading glasses but doesn't usually wear them   VISION ASSESSMENT:  To be further assessed in functional context Tracking/Visual pursuits: Able to track stimulus in all quads without difficulty Saccades: WFL Visual Fields: no apparent deficits Clock drawing completed: all numbers spaced evenly, but pt left out the 1 and verbalized not knowing how to draw ten minutes to eleven when this direction was given.  Patient has difficulty with following activities due to following visual impairments: To be further assessed  PERCEPTION: To be further assessed  PRAXIS: Impaired: Motor planning  OBSERVATIONS: Pt pleasant, cooperative.  Daughter present and very supportive in pt's care.                                                                                                                       TREATMENT DATE: 11/08/23  Self-Care:  Pt. Worked on the IADL folding laundry while engaging the bilateral UEs. Pt. Focused on  aligning the corners when folding the wash cloths, pillows cases, and towels. -incorporated UE reaching through various planes for the linens.  Neuromuscular re-education:   -Facilitated left hand [facility]  skills grasping 1 sticks,  cylindrical collars, and  flat washers on the Purdue pegboard. Pt. Worked on Grasping each item with the  2nd digit and thumb, and storing them in the palm. Pt. Attempted translatory movements moving the the pegs from the palm to the tip of the 2nd digit and thumb in preparation for discarding them. -Facilitated bilateral alternating hand movements removing the sticks. -Facilitated left  hand FMC tasks using the Grooved pegboard, grasping and 1 grooved pegs from a horizontal position in the shallow dish, and transitioning them to a vertical position in preparation for placing them upright into the pegboard.      PATIENT EDUCATION: Education details: HEP Person educated: Patient and Child(ren) Education method: Programmer, multimedia, Facilities manager, Verbal cues, and Handouts Education comprehension: verbalized understanding and needs further education  HOME EXERCISE PROGRAM: Yellow theraputty  GOALS: Goals reviewed with patient? Yes  SHORT TERM GOALS: Target date: 12/11/23  Pt will perform HEP with min vc or less for improving LUE coordination and bilat hand strength. Baseline: Eval: HEP not yet initiated Goal status: INITIAL  2.  Pt will be indep to verbalize and implement 2-3 fall prevention measures to reduce fall risk with ADLs.  Baseline: Eval: Educ not yet initiated  Goal status: INITIAL  LONG TERM GOALS: Target date: 01/22/24  Pt will perform shower transfers with modified indep Baseline: Eval: Direct supv Goal status: INITIAL  2.  Pt will improve LUE GMC to enable reaching with good accuracy into overhead kitchen cabinets. Baseline: Eval: Mild LUE ataxia  Goal status: INITIAL  3.  Pt will improve L hand [facility] skills as demonstrated by 10 sec or more  improvement in 9 hole peg test for improved efficiency when manipulating  clothing fasteners. Baseline: Eval: L 9 hole peg test: 47 sec, R 29 sec Goal status: INITIAL  4.  Pt will manage light loads of laundry with modified indep. Baseline: Eval: Daughter mostly manages, but pt can  start laundry on a good day Goal status: INITIAL  ASSESSMENT:  CLINICAL IMPRESSION:  Pt. was able to efficiently engage for folding laundry neatly. Pt. required consistent cues to engage the left hand during [employer] tasks. Pt. presented with difficulty attempting to perform translatory movements with the left hand. Pt. required consistent cuing to alternate bilateral hand crossing midline to remove the 1 sticks. Pt continues to benefit from skilled OT to address above noted deficits while working to maximize Pt's level of indep with ADL/IADL tasks in order to reduce burden of care on daughter and improve QOL.     PERFORMANCE DEFICITS: in functional skills including ADLs, IADLs, coordination, dexterity, sensation, ROM, strength, pain, Fine motor control, Gross motor control, mobility, balance, body mechanics, endurance, decreased knowledge of precautions, decreased knowledge of use of DME, vision, and UE functional use, cognitive skills including emotional, perception, problem solving, safety awareness, temperament/personality, and understand, and psychosocial skills including coping strategies, environmental adaptation, and routines and behaviors.   IMPAIRMENTS: are limiting patient from ADLs, IADLs, leisure, and social participation.   CO-MORBIDITIES: has co-morbidities such as afib, AAA, hx of multiple CVAs that affects occupational performance. Patient will benefit from skilled OT to address above impairments and improve overall function.  MODIFICATION OR ASSISTANCE TO COMPLETE EVALUATION: Min-Moderate modification of tasks or assist with assess necessary to complete an evaluation.  OT OCCUPATIONAL PROFILE AND  HISTORY: Detailed assessment: Review of records and additional review of physical, cognitive, psychosocial history related to current functional performance.  CLINICAL DECISION MAKING: Moderate - several treatment options, min-mod task modification necessary  REHAB POTENTIAL: Good  EVALUATION COMPLEXITY: Moderate    PLAN:  OT FREQUENCY: 2x/week  OT DURATION: 12 weeks  PLANNED INTERVENTIONS: 97168 OT Re-evaluation, 97535 self care/ADL training, 02889 therapeutic exercise, 97530 therapeutic activity, 97112 neuromuscular re-education, 97140 manual therapy, 97116 gait training, 02989 moist heat, 97010 cryotherapy, 97129 Cognitive training (first 15 min), 02249 Physical Performance Testing, balance training, functional mobility training, visual/perceptual remediation/compensation, psychosocial skills training, energy conservation, coping strategies training, patient/family education, and DME and/or AE instructions  RECOMMENDED OTHER SERVICES: none at this time (OT/PT/SLP services have all been ordered)  CONSULTED AND AGREED WITH PLAN OF CARE: Patient and family member/caregiver  PLAN FOR NEXT SESSION: see above  Richardson Otter, MS, OTR/L  11/08/2023, 9:11 AM

## 2023-11-09 ENCOUNTER — Ambulatory Visit

## 2023-11-09 ENCOUNTER — Ambulatory Visit: Payer: Self-pay

## 2023-11-09 DIAGNOSIS — I10 Essential (primary) hypertension: Secondary | ICD-10-CM

## 2023-11-09 NOTE — Telephone Encounter (Signed)
 FYI Only or Action Required?: Action required by provider: Refusing ED, needs call back asap with further recommendations, alerted [facility] to ED refusal.  Patient was last seen in primary care on 10/18/2023 by Hannah [name] BRAVO, MD.  Called Nurse Triage reporting Hypertension, Dizziness, and Weakness.  Symptoms began several days ago.  Interventions attempted: Prescription medications: BP med and Other: ED on 9/13.  Symptoms are: rapidly worsening.  Triage Disposition: Go to ED Now (Notify PCP)  Patient/caregiver understands and will follow disposition?: No, refuses disposition      Copied from CRM [id number]. Topic: Clinical - Red Word Triage >> Nov 09, 2023 10:40 AM Hannah Kirby wrote: Red Word that prompted transfer to Nurse Triage: BP 195/95 last night, severe dizziness.   Hannah Kirby, daughter is calling   ----------------------------------------------------------------------- From previous Reason for Contact - Scheduling: Patient/patient representative is calling to schedule an appointment. Refer to attachments for appointment information. Reason for Disposition  [1] Systolic BP >= 160 OR Diastolic >= 100 AND [2] cardiac (e.g., breathing difficulty, chest pain) or neurologic symptoms (e.g., new-onset blurred or double vision, unsteady gait)  Answer Assessment - Initial Assessment Questions Advised pt go to ED for symptoms, pt refusing. Advised hospital even calling 911 right away for any worsening or new symptoms. Sending message to PCP office for call back with further recommendations. Alerted CAL to ED refusal.    Hannah Kirby daughter on [place], daughter adding pt husband on the line  1. BLOOD PRESSURE: What is your blood pressure? Did you take at least two measurements 5 minutes apart?     Last night 195/95 Been high like when checking it this weekend before hospital 9/13 now creeping back up, 160-190/80-90 Don't have a reading for today 4. HISTORY: Do you have a  history of high blood pressure?     yes 5. MEDICINES: Are you taking any medicines for blood pressure? Have you missed any doses recently?     No pretty good about taking her med 6. OTHER SYMPTOMS: Do you have any symptoms? (e.g., blurred vision, chest pain, difficulty breathing, headache, weakness)     No chest pain, SOB, blurred vision, headache Lots of dizziness, not almost passing out, not stumbling Pt confirms weaker than normal 160/88 at 10:51 am  Protocols used: Blood Pressure - High-A-AH

## 2023-11-09 NOTE — Telephone Encounter (Signed)
 She can increase felodipine  to 2 tablets a day. If her BP drops below 120 then she should go back to 1 a day.

## 2023-11-10 ENCOUNTER — Telehealth: Payer: Self-pay | Admitting: Cardiovascular Disease

## 2023-11-10 MED ORDER — METOPROLOL TARTRATE 100 MG PO TABS: 100.0000 mg | ORAL_TABLET | Freq: Two times a day (BID) | ORAL | 2 refills | Status: AC

## 2023-11-10 NOTE — Telephone Encounter (Signed)
 Pt's medication was sent to pt's pharmacy as requested. Confirmation received.

## 2023-11-10 NOTE — Telephone Encounter (Signed)
 Daughter has been advised of message.  She states they weren't really calling about the blood pressure it was more about the dizziness.  She has been given an appointment for Monday 11/13/23 and she will keep a close check on herf blood pressure for the next few days.

## 2023-11-10 NOTE — Telephone Encounter (Signed)
*  STAT* If patient is at the pharmacy, call can be transferred to refill team.   1. Which medications need to be refilled? (please list name of each medication and dose if known) metoprolol  100 MG   2. Would you like to learn more about the convenience, safety, & potential cost savings by using the [facility]? no     3. Are you open to using the [facility] (Type [facility]. no ).   4. Which pharmacy/location (including street and city if local pharmacy) is medication to be sent to?[place]   5. Do they need a 30 day or 90 day supply? 90 day

## 2023-11-13 ENCOUNTER — Encounter: Payer: Self-pay | Admitting: Family Medicine

## 2023-11-13 ENCOUNTER — Ambulatory Visit: Admitting: Family Medicine

## 2023-11-13 VITALS — BP 120/83 | HR 71 | Resp 16 | Ht 65.0 in | Wt 160.0 lb

## 2023-11-13 DIAGNOSIS — R42 Dizziness and giddiness: Secondary | ICD-10-CM

## 2023-11-13 DIAGNOSIS — I10 Essential (primary) hypertension: Secondary | ICD-10-CM

## 2023-11-13 DIAGNOSIS — Z23 Encounter for immunization: Secondary | ICD-10-CM

## 2023-11-13 MED ORDER — KERENDIA 10 MG PO TABS: 10.0000 mg | ORAL_TABLET | Freq: Every day | ORAL | 0 refills | Status: AC

## 2023-11-13 MED ORDER — HYDROCODONE-ACETAMINOPHEN 7.5-325 MG PO TABS: 1.0000 | ORAL_TABLET | Freq: Four times a day (QID) | ORAL | 0 refills | Status: AC | PRN

## 2023-11-13 MED ORDER — LISINOPRIL 20 MG PO TABS: 20.0000 mg | ORAL_TABLET | Freq: Every day | ORAL | 4 refills | Status: AC

## 2023-11-13 MED ORDER — FELODIPINE ER 5 MG PO TB24: 5.0000 mg | ORAL_TABLET | Freq: Every day | ORAL | 3 refills | Status: AC

## 2023-11-14 ENCOUNTER — Ambulatory Visit

## 2023-11-14 ENCOUNTER — Ambulatory Visit: Admitting: Occupational Therapy

## 2023-11-14 ENCOUNTER — Other Ambulatory Visit: Payer: Self-pay | Admitting: Family Medicine

## 2023-11-14 DIAGNOSIS — I10 Essential (primary) hypertension: Secondary | ICD-10-CM

## 2023-11-15 ENCOUNTER — Encounter

## 2023-11-16 ENCOUNTER — Ambulatory Visit: Admitting: Occupational Therapy

## 2023-11-16 ENCOUNTER — Ambulatory Visit

## 2023-11-16 ENCOUNTER — Encounter

## 2023-11-16 ENCOUNTER — Encounter: Payer: Self-pay | Admitting: Occupational Therapy

## 2023-11-16 ENCOUNTER — Ambulatory Visit: Admitting: Physical Therapy

## 2023-11-16 DIAGNOSIS — R262 Difficulty in walking, not elsewhere classified: Secondary | ICD-10-CM

## 2023-11-16 DIAGNOSIS — R2689 Other abnormalities of gait and mobility: Secondary | ICD-10-CM | POA: Diagnosis not present

## 2023-11-16 DIAGNOSIS — M6281 Muscle weakness (generalized): Secondary | ICD-10-CM

## 2023-11-16 DIAGNOSIS — R2681 Unsteadiness on feet: Secondary | ICD-10-CM | POA: Diagnosis not present

## 2023-11-16 DIAGNOSIS — R278 Other lack of coordination: Secondary | ICD-10-CM

## 2023-11-16 DIAGNOSIS — R4701 Aphasia: Secondary | ICD-10-CM

## 2023-11-16 DIAGNOSIS — R269 Unspecified abnormalities of gait and mobility: Secondary | ICD-10-CM | POA: Diagnosis not present

## 2023-11-16 DIAGNOSIS — R482 Apraxia: Secondary | ICD-10-CM

## 2023-11-16 NOTE — Therapy (Signed)
 OUTPATIENT SPEECH LANGUAGE PATHOLOGY APHASIA TREATMENT   Patient Name: Hannah Kirby MRN: [id number] DOB:September 23, 1945, 78 y.o., female Today's Date: 11/16/2023  PCP: [name] Perry, MD REFERRING PROVIDER: [name] Perry, MD   End of Session - 11/16/23 1446     Visit Number 8    Number of Visits 23    Date for Recertification  01/09/24    Progress Note Due on Visit 10    SLP Start Time 1445    SLP Stop Time  1530    SLP Time Calculation (min) 45 min    Activity Tolerance Patient tolerated treatment well          Past Medical History:  Diagnosis Date   Acute hemorrhagic colitis 08/08/2019   Anemia    C. difficile colitis 08/09/2019   CAD (coronary artery disease)    a. 02/2006 PCI: BMS x 2 to RCA, cath o/w without significant coronary disease; b. nuclear stress test 07/2014: No ischemia/infarct; c. 11/2017 MV: no isch/infarct, EF 55-65%; d. 03/2020 NSTEMI/PCI: LM nl, LAD min irregs, RI 25, small, LCX nl, RCA 30p/m ISR, 99d (3.0x15 Resolute Onyx DES).   Cataract 02/04/2018   Cerebrovascular accident (CVA) (HCC) 03/18/2023   dysarthria   Chronic bronchitis (HCC)    secondary to cigarette smoking   FHx: allergies    Goiter    Granulomatous disease    Hernia    Kidney stone on left side 2013   NSTEMI (non-ST elevated myocardial infarction) (HCC) 03/23/2020   Oxygen  deficiency    Panic attacks    PVC's (premature ventricular contractions)    a. 03/2018 Zio: Occas PVCs (2.5%). Triggered events assoc w/ PVC/PAC.   Retinal detachment, left 03/04/2020   Retinal tear 2020   Stroke (HCC) 10/29/2016   mild left side weakness   Stroke (HCC) 08/01/2019   Tobacco abuse    Past Surgical History:  Procedure Laterality Date   ABDOMINAL HYSTERECTOMY     BREAST SURGERY     CATARACT EXTRACTION W/PHACO Right 03/01/2017   Procedure: CATARACT EXTRACTION PHACO AND INTRAOCULAR LENS PLACEMENT (IOC) RIGHT DIABETIC;  Surgeon: Mittie Gaskin, MD;  Location: [facility];  Service:  Ophthalmology;  Laterality: Right;   CATARACT EXTRACTION W/PHACO Left 03/22/2017   Procedure: CATARACT EXTRACTION PHACO AND INTRAOCULAR LENS PLACEMENT (IOC) LEFT DIABETIC;  Surgeon: Mittie Gaskin, MD;  Location: [facility];  Service: Ophthalmology;  Laterality: Left;  Diabetic - insulin  and oral meds   COLONOSCOPY WITH PROPOFOL  N/A 09/23/2014   Procedure: COLONOSCOPY WITH PROPOFOL ;  Surgeon: Gladis RAYMOND Mariner, MD;  Location: [facility];  Service: Endoscopy;  Laterality: N/A;   COLONOSCOPY WITH PROPOFOL  N/A 01/11/2018   Procedure: COLONOSCOPY WITH PROPOFOL ;  Surgeon: Mariner Gladis RAYMOND, MD;  Location: [facility] ENDOSCOPY;  Service: Endoscopy;  Laterality: N/A;   COLONOSCOPY WITH PROPOFOL  N/A 04/24/2018   Procedure: COLONOSCOPY WITH PROPOFOL ;  Surgeon: Mariner Gladis RAYMOND, MD;  Location: [facility] ENDOSCOPY;  Service: Endoscopy;  Laterality: N/A;   COLONOSCOPY WITH PROPOFOL  N/A 11/19/2019   Procedure: COLONOSCOPY WITH PROPOFOL ;  Surgeon: Jinny Carmine, MD;  Location: [facility] ENDOSCOPY;  Service: Endoscopy;  Laterality: N/A;   CORONARY ANGIOPLASTY WITH STENT PLACEMENT  2008   CORONARY STENT INTERVENTION N/A 03/24/2020   Procedure: CORONARY STENT INTERVENTION;  Surgeon: Mady Bruckner, MD;  Location: [facility] INVASIVE CV LAB;  Service: Cardiovascular;  Laterality: N/A;   ESOPHAGOGASTRODUODENOSCOPY (EGD) WITH PROPOFOL  N/A 12/30/2014   Procedure: ESOPHAGOGASTRODUODENOSCOPY (EGD) WITH PROPOFOL ;  Surgeon: Gladis RAYMOND Mariner, MD;  Location: [facility] ENDOSCOPY;  Service: Endoscopy;  Laterality: N/A;   ESOPHAGOGASTRODUODENOSCOPY (EGD) WITH PROPOFOL  N/A 07/19/2016   Procedure: ESOPHAGOGASTRODUODENOSCOPY (EGD) WITH PROPOFOL ;  Surgeon: Gaylyn Gladis PENNER, MD;  Location: [facility];  Service: Endoscopy;  Laterality: N/A;   ESOPHAGOGASTRODUODENOSCOPY (EGD) WITH PROPOFOL  N/A 04/24/2018   Procedure: ESOPHAGOGASTRODUODENOSCOPY (EGD) WITH PROPOFOL ;  Surgeon: Gaylyn Gladis PENNER, MD;  Location: [facility] ENDOSCOPY;   Service: Endoscopy;  Laterality: N/A;   EYE SURGERY  cataracts, detached retina   hysterectomy (other)     LEFT HEART CATH AND CORONARY ANGIOGRAPHY N/A 03/24/2020   Procedure: LEFT HEART CATH AND CORONARY ANGIOGRAPHY;  Surgeon: Mady Bruckner, MD;  Location: [facility] INVASIVE CV LAB;  Service: Cardiovascular;  Laterality: N/A;   Patient Active Problem List   Diagnosis Date Noted   Stroke (HCC) 08/05/2023   Depression with anxiety 08/05/2023   Overweight (BMI 25.0-29.9) 08/05/2023   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) 03/17/2023   Dizziness 02/23/2023   Restrictive airway disease 07/21/2020   Partial thickness rotator cuff tear 03/20/2020   Shoulder pain 03/05/2020   Ataxia 02/28/2020   Difficulty walking 02/28/2020   Physical deconditioning 02/28/2020   Chronic diastolic congestive heart failure (HCC) 11/20/2019   Polyp of ascending colon    Hypokalemia 08/08/2019   TIA (transient ischemic attack) 08/01/2019   Benign hypertensive kidney disease with chronic kidney disease 04/25/2019   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/24/2018   Chronic, continuous use of opioids 03/26/2018   History of adenomatous polyp of colon 01/22/2018   Diverticulosis of colon 01/22/2018   History of CVA (cerebrovascular accident) 11/08/2016   Weakness of left upper extremity 10/30/2016   AAA (abdominal aortic aneurysm) without rupture 08/12/2016   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   Stage 3b chronic kidney disease (HCC) 07/27/2015   Type 2 diabetes mellitus with diabetic chronic kidney disease (HCC) 10/28/2014   Solitary pulmonary nodule on lung CT 08/20/2014   Allergic rhinitis 07/28/2014   Acid reflux 07/28/2014   Chronic obstructive pulmonary disease (HCC) 07/28/2014   Granuloma annulare    Back pain 11/12/2013   Lumbar scoliosis 10/30/2013   Lumbar radiculopathy 10/30/2013   Lumbar canal stenosis 10/30/2013   Proteinuria 10/22/2013   Smokes with greater  than 30 pack year history 03/21/2011   Edema 08/18/2010   Hyperlipidemia 03/25/2009   Coronary artery disease 03/25/2009   Primary hypertension 03/25/2009    ONSET DATE: 03/17/23 (admitted with stroke), 05/23/23 (referral date)  REFERRING DIAG:  R41.841 (ICD-10-CM) - Cognitive communication deficit  I63.9 (ICD-10-CM) - CVA (cerebral vascular accident) (HCC)  R47.02 (ICD-10-CM) - Dysphasia    THERAPY DIAG:  Aphasia  Apraxia of speech  Rationale for Evaluation and Treatment Rehabilitation  SUBJECTIVE:   SUBJECTIVE STATEMENT: Pt alert, pleasant, and cooperative.  Pt accompanied by: self and family member  PERTINENT HISTORY:  Pt is a 78 y.o. female who presents for communication evaluation in setting on stroke. Pt in ED 1/24-1/25/25. Pt d/c'd home with HH ST. PMHx significant of   HTN, CKD stage IIIB, DMT2 on insulin , CAD, chronic smoking, chronic respiratory failure with COPD and on home oxygen , anxiety, HLD. MRI 03/17/23 1. Punctate foci of restricted diffusion in the left posterior  frontal white matter, most likely acute infarcts.  2. Numerous foci of hemosiderin deposition, which are seen in the  deep gray structures but also in the bilateral cerebral hemispheres,  with 1 new focus compared to 02/20/2023. While this could be the  sequela of chronic hypertensive microhemorrhages, this appearance is  concerning for cerebral amyloid angiopathy.  PAIN:  Are you having pain? No  FALLS: defer to PT  LIVING ENVIRONMENT: Lives with: lives with their spouse Lives in: House/apartment  PLOF:  Level of assistance: Independent with ADLs Employment: Retired   PATIENT GOALS    for communication to improve   OBJECTIVE:  TODAY'S TREATMENT:  Pt verbally answered questions to complete TAP form with min/mod cues - increased cueing need for open ended questions re: goals. Pt required encouragement for use of multimodal communication and use of SGD. Pt able to type basic bio  information with min A (including name, DOB).  Pt provided with additional information re: aphasia programming/groups as pt/family trainings. Reviewed importance of social communication in aphasia recovery and overall life participation. Synced new SGD this date. Pt planning to bring in charger for loaner SGD, so SLP can facilitate return of loaner.     PATIENT EDUCATION: Education details: as above Person educated: Patient and Child Education method: Explanation Education comprehension: verbalized understanding and needs further education  HOME EXERCISE PROGRAM:    SGD home practice worksheet    GOALS:  Goals reviewed with patient? Yes  SHORT TERM GOALS: Target date: 10 sessions  Pt will participate in further assessment of functional reading/writing. Baseline: Goal status: INITIAL  2.  With Moderate A, patient will complete a semantic feature analysis with at least 2 relevant features for 8/10 target words to improve word-finding skills.  Baseline:  Goal status: INITIAL  3.  With Maximal A, patient will generate sentences with 3 or more words in response to a situation at 80% accuracy in order to increase ability to communicate basic wants and needs.  Baseline:  Goal status: INITIAL  4.   With Maximal A,  pt will follow 2-step commands for improved participation in ADLs/iADLs with max cueing. Baseline:  Goal status: INITIAL  5.  Pt will repeat short sentences (<4 words) with good approximations 80% of the time with max cueing. Baseline:  Goal status: INITIAL    LONG TERM GOALS: Target date: 12 weeks  Pt will report a subjective improvement in communication per PROM.  Baseline: CES 12/32 on 8/26 Goal status: INITIAL  2.  With Min A, patient/family will demonstrate understanding of the following concepts: aphasia, spontaneous recovery, communication vs conversation, strengths/strategies to promote success, local resources in order to increase patient's participation  in medical care.    Baseline:  Goal status: INITIAL   ASSESSMENT:  CLINICAL IMPRESSION: Pt is a 78 y.o. female who presents for communication treatment in setting on stroke. Pt known to SLP services and was receiving OP ST prior to most recent stroke 08/05/23. Pt d/c'd home with [facility] ST who is working on securing SGD for pt. PMHx significant of  HTN, CKD stage IIIB, DMT2 on insulin , CAD, chronic smoking, chronic respiratory failure with COPD and on home oxygen , anxiety, HLD. Assessment this episode completed via functional/dynamic means including Western Aphasia Battery Revised (WAB-R) and PROM (Communication Effectiveness Scale). 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 See details of tx session above. Recommend course of ST targeting functional communication, further assessment of functional reading/writing, and pt/caregiver training to help promote QoL and overall life participation.   OBJECTIVE IMPAIRMENTS include expressive language, receptive language, and aphasia. These impairments are limiting patient from ADLs/IADLs and effectively communicating at home and in community. Factors affecting potential to achieve goals and functional outcome are severity of impairments. Patient will benefit from skilled SLP services  to address above impairments and improve overall function.  REHAB POTENTIAL: Good  PLAN: SLP FREQUENCY: 2x/week  SLP DURATION: 12 weeks  PLANNED INTERVENTIONS: Language facilitation, Cueing hierachy, Internal/external aids, Functional tasks, Multimodal communication approach, SLP instruction and feedback, Compensatory strategies, and Patient/family education    Delon Bangs, M.S., CCC-SLP Speech-Language Pathologist [place] [facility] [phone] [place])  [place] [facility] [address] [place], [place], [address] Phone: [phone]   Fax:  [phone]

## 2023-11-16 NOTE — Therapy (Signed)
 OUTPATIENT PHYSICAL THERAPY NEURO TREATMENT   Patient Name: Hannah Kirby MRN: [id number] DOB:[birth year], 78 y.o., female Today's Date: 11/16/2023   PCP: Gasper [name] BRAVO, MD REFERRING PROVIDER: Gasper [name] BRAVO, MD  END OF SESSION:    PT End of Session - 11/16/23 1537     Visit Number 8    Number of Visits 25    Date for Recertification  01/09/24    PT Start Time 1535    PT Stop Time 1600    PT Time Calculation (min) 25 min    Equipment Utilized During Treatment Gait belt;Oxygen     Activity Tolerance Patient tolerated treatment well    Behavior During Therapy [facility] for tasks assessed/performed;Flat affect         Past Medical History:  Diagnosis Date   Acute hemorrhagic colitis 08/08/2019   Anemia    C. difficile colitis 08/09/2019   CAD (coronary artery disease)    a. 02/2006 PCI: BMS x 2 to RCA, cath o/w without significant coronary disease; b. nuclear stress test 07/2014: No ischemia/infarct; c. 11/2017 MV: no isch/infarct, EF 55-65%; d. 03/2020 NSTEMI/PCI: LM nl, LAD min irregs, RI 25, small, LCX nl, RCA 30p/m ISR, 99d (3.0x15 Resolute Onyx DES).   Cataract 02/04/2018   Cerebrovascular accident (CVA) (HCC) 03/18/2023   dysarthria   Chronic bronchitis (HCC)    secondary to cigarette smoking   FHx: allergies    Goiter    Granulomatous disease    Hernia    Kidney stone on left side 2013   NSTEMI (non-ST elevated myocardial infarction) (HCC) 03/23/2020   Oxygen  deficiency    Panic attacks    PVC's (premature ventricular contractions)    a. 03/2018 Zio: Occas PVCs (2.5%). Triggered events assoc w/ PVC/PAC.   Retinal detachment, left 03/04/2020   Retinal tear 2020   Stroke (HCC) 10/29/2016   mild left side weakness   Stroke (HCC) 08/01/2019   Tobacco abuse    Past Surgical History:  Procedure Laterality Date   ABDOMINAL HYSTERECTOMY     BREAST SURGERY     CATARACT EXTRACTION W/PHACO Right 03/01/2017   Procedure: CATARACT EXTRACTION PHACO AND INTRAOCULAR LENS  PLACEMENT (IOC) RIGHT DIABETIC;  Surgeon: Mittie Gaskin, MD;  Location: [facility];  Service: Ophthalmology;  Laterality: Right;   CATARACT EXTRACTION W/PHACO Left 03/22/2017   Procedure: CATARACT EXTRACTION PHACO AND INTRAOCULAR LENS PLACEMENT (IOC) LEFT DIABETIC;  Surgeon: Mittie Gaskin, MD;  Location: [facility];  Service: Ophthalmology;  Laterality: Left;  Diabetic - insulin  and oral meds   COLONOSCOPY WITH PROPOFOL  N/A 09/23/2014   Procedure: COLONOSCOPY WITH PROPOFOL ;  Surgeon: Gladis RAYMOND Mariner, MD;  Location: [facility] ENDOSCOPY;  Service: Endoscopy;  Laterality: N/A;   COLONOSCOPY WITH PROPOFOL  N/A 01/11/2018   Procedure: COLONOSCOPY WITH PROPOFOL ;  Surgeon: Mariner Gladis RAYMOND, MD;  Location: [facility] ENDOSCOPY;  Service: Endoscopy;  Laterality: N/A;   COLONOSCOPY WITH PROPOFOL  N/A 04/24/2018   Procedure: COLONOSCOPY WITH PROPOFOL ;  Surgeon: Mariner Gladis RAYMOND, MD;  Location: [facility] ENDOSCOPY;  Service: Endoscopy;  Laterality: N/A;   COLONOSCOPY WITH PROPOFOL  N/A 11/19/2019   Procedure: COLONOSCOPY WITH PROPOFOL ;  Surgeon: Jinny Carmine, MD;  Location: [facility] ENDOSCOPY;  Service: Endoscopy;  Laterality: N/A;   CORONARY ANGIOPLASTY WITH STENT PLACEMENT  2008   CORONARY STENT INTERVENTION N/A 03/24/2020   Procedure: CORONARY STENT INTERVENTION;  Surgeon: Mady Bruckner, MD;  Location: [facility] INVASIVE CV LAB;  Service: Cardiovascular;  Laterality: N/A;   ESOPHAGOGASTRODUODENOSCOPY (EGD) WITH PROPOFOL  N/A 12/30/2014   Procedure: ESOPHAGOGASTRODUODENOSCOPY (  EGD) WITH PROPOFOL ;  Surgeon: Gladis RAYMOND Mariner, MD;  Location: [facility] ENDOSCOPY;  Service: Endoscopy;  Laterality: N/A;   ESOPHAGOGASTRODUODENOSCOPY (EGD) WITH PROPOFOL  N/A 07/19/2016   Procedure: ESOPHAGOGASTRODUODENOSCOPY (EGD) WITH PROPOFOL ;  Surgeon: Mariner Gladis RAYMOND, MD;  Location: [facility] ENDOSCOPY;  Service: Endoscopy;  Laterality: N/A;   ESOPHAGOGASTRODUODENOSCOPY (EGD) WITH PROPOFOL  N/A 04/24/2018   Procedure:  ESOPHAGOGASTRODUODENOSCOPY (EGD) WITH PROPOFOL ;  Surgeon: Mariner Gladis RAYMOND, MD;  Location: [facility] ENDOSCOPY;  Service: Endoscopy;  Laterality: N/A;   EYE SURGERY  cataracts, detached retina   hysterectomy (other)     LEFT HEART CATH AND CORONARY ANGIOGRAPHY N/A 03/24/2020   Procedure: LEFT HEART CATH AND CORONARY ANGIOGRAPHY;  Surgeon: Mady Bruckner, MD;  Location: [facility] INVASIVE CV LAB;  Service: Cardiovascular;  Laterality: N/A;   Patient Active Problem List   Diagnosis Date Noted   Stroke (HCC) 08/05/2023   Depression with anxiety 08/05/2023   Overweight (BMI 25.0-29.9) 08/05/2023   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) 03/17/2023   Dizziness 02/23/2023   Restrictive airway disease 07/21/2020   Partial thickness rotator cuff tear 03/20/2020   Shoulder pain 03/05/2020   Ataxia 02/28/2020   Difficulty walking 02/28/2020   Physical deconditioning 02/28/2020   Chronic diastolic congestive heart failure (HCC) 11/20/2019   Polyp of ascending colon    Hypokalemia 08/08/2019   TIA (transient ischemic attack) 08/01/2019   Benign hypertensive kidney disease with chronic kidney disease 04/25/2019   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/24/2018   Chronic, continuous use of opioids 03/26/2018   History of adenomatous polyp of colon 01/22/2018   Diverticulosis of colon 01/22/2018   History of CVA (cerebrovascular accident) 11/08/2016   Weakness of left upper extremity 10/30/2016   AAA (abdominal aortic aneurysm) without rupture 08/12/2016   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   Stage 3b chronic kidney disease (HCC) 07/27/2015   Type 2 diabetes mellitus with diabetic chronic kidney disease (HCC) 10/28/2014   Solitary pulmonary nodule on lung CT 08/20/2014   Allergic rhinitis 07/28/2014   Acid reflux 07/28/2014   Chronic obstructive pulmonary disease (HCC) 07/28/2014   Granuloma annulare    Back pain 11/12/2013   Lumbar scoliosis 10/30/2013   Lumbar  radiculopathy 10/30/2013   Lumbar canal stenosis 10/30/2013   Proteinuria 10/22/2013   Smokes with greater than 30 pack year history 03/21/2011   Edema 08/18/2010   Hyperlipidemia 03/25/2009   Coronary artery disease 03/25/2009   Primary hypertension 03/25/2009    ONSET DATE: 08/05/23  REFERRING DIAG:  Z86.73 (ICD-10-CM) - History of CVA (cerebrovascular accident)  R27.0 (ICD-10-CM) - Ataxia    THERAPY DIAG:   Other lack of coordination  Muscle weakness (generalized)  Aphasia  Apraxia of speech  Difficulty in walking, not elsewhere classified  Unsteadiness on feet  Abnormality of gait and mobility  Other abnormalities of gait and mobility  Rationale for Evaluation and Treatment: Rehabilitation  SUBJECTIVE:  SUBJECTIVE STATEMENT:  Pt reports she has been consistently having issues with her balance and dizziness lately.  Pt is seeing an ENT next Tuesday.    Pt accompanied by: self and family member, daughter  PERTINENT HISTORY:  PMH: of CVA, COPD, Dysarthria, Ataxia, TIA, T2DM, Back pain, scoliosis   PAIN:  Are you having pain? Yes: NPRS scale: 3/10 Pain location: low back and L hip Pain description: achy Aggravating factors: just hurts all the time. Relieving factors: hydrocodone  at night  PRECAUTIONS: Other: Pt has a looper to see if pt has a-fib  RED FLAGS: Abdominal aneurysm: Yes: pt reports having an abdominal aneurism as found by Dr. Marea.   WEIGHT BEARING RESTRICTIONS: No  FALLS: Has patient fallen in last 6 months? Yes. Number of falls 1.  Pt reports losing balance, and got caught up in cords that were on the floor when she was trying to close the blinds.  LIVING ENVIRONMENT: Lives with: lives with their spouse Lives in: House/apartment Stairs: Yes: External: 1  steps; none Has following equipment at home: Single point cane, Walker - 2 wheeled, Environmental consultant - 4 wheeled, shower chair, and Grab bars  PLOF: Independent  PATIENT GOALS: Pt is wanting to gain independence.  Pt is wanting to improve endurance levels and improving speech with SLP.  OBJECTIVE:  Note: Objective measures were completed at Evaluation unless otherwise noted.  DIAGNOSTIC FINDINGS:   EXAM: MRI HEAD WITHOUT CONTRAST   MRA HEAD WITHOUT CONTRAST   MRA NECK WITHOUT AND WITH CONTRAST   IMPRESSION: 1. Small acute infarcts in the left frontal lobe, right parietal lobe, and left cerebellum. 2. Extensive chronic small vessel ischemic disease. 3. 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 4. Unchanged severe right A3 stenosis or segmental occlusion. 5. Moderate left P2 stenosis. 6. Limited assessment of the left vertebral artery origin, otherwise negative neck MRA.   COGNITION: Overall cognitive status: Within functional limits for tasks assessed   SENSATION: WFL, however pt notes having some numbness in the R hand.  COORDINATION: WFL  POSTURE: rounded shoulders, forward head, decreased lumbar lordosis, increased thoracic kyphosis, posterior pelvic tilt, and flexed trunk    LOWER EXTREMITY MMT:    MMT Right Eval Left Eval  Hip flexion 4 4  Hip extension    Hip abduction 4 4-  Hip adduction 4 4-  Hip internal rotation    Hip external rotation    Knee flexion 4 4-  Knee extension 4 4-  Ankle dorsiflexion 4 4-  Ankle plantarflexion    Ankle inversion    Ankle eversion    (Blank rows = not tested)  BED MOBILITY:  Not tested  FUNCTIONAL TESTS:  5 times sit to stand: 24.36 sec Timed up and go (TUG): 23.94 sec with walker 2 minute walk test: TBD 10 meter walk test: 20.33 sec with walker Berg Balance Scale: TBD  PATIENT SURVEYS:   Stroke Impact Scale 16 (Copyrighted instrument, [facility])  In the past 2 weeks, how  difficult was it to...  Rating Scale 5 = Not difficult at all 4 = A little difficult 3 = Somewhat difficult 2 = Very difficult 1 = Could not do at all  a. Dress the top part of your body? 4  b. Bathe yourself? 2  c. Get to the toilet on time? 5  d. Control your bladder (not have an accident)? 5  e. Control your bowels (not have an accident)? 5  f. Stand without  losing balance? 3  g. Go shopping? 2  h. Do heavy household chores (e.g. vacuum, laundry or  yard work)? 1  i. Stay sitting without losing your balance? 3  j. Walk without losing your balance? 2  k. Move from a bed to a chair? 3  l. Walk fast? 1  m. Climb one flight of stairs? 1  n. Walk one block? 1  o. Get in and out of a car? 2  p. Carry heavy objects (e.g. bag of groceries) with your  affected hand? 1  Sum:  41/80  MDC (Minimal Detectable Change) is >/=8                                                                                                                                 TREATMENT DATE: 11/16/23  Pt arrived to session in transport chair, daughter present.   SpO2: 89-92% upon arrival to the clinic with room air; seated.    TherEx: To improve strength, endurance, mobility, and function of specific targeted muscle groups or improve joint range of motion or improve muscle flexibility  Seated hamstring curls with GTB, 2x15 each LE Seated resisted hip abduction with GTB, 2x15 each side Seated LAQ'd with 5# AW donned, 2x15 each LE Seated marches with 5# AW donned, 2x15 each LE Seated hip adduction with rainbow physioball, 3 sec holds, 2x15        PATIENT EDUCATION: Education details: Pt educated on role of PT and services provided during current POC, along with prognosis and information about the clinic.  Person educated: Patient and Child(ren) Education method: Explanation Education comprehension: verbalized understanding and returned demonstration  HOME EXERCISE PROGRAM:  Access Code:  [email or web address] URL: [email or web address] Date: 10/17/2023 Prepared by: Sidra Simpers  Exercises - Standing March with Counter Support  - 1 x daily - 3-4 x weekly - 3 sets - 10 reps - Standing Knee Flexion with Unilateral Counter Support  - 1 x daily - 7 x weekly - 3 sets - 10 reps - Standing Hip Extension with Unilateral Counter Support  - 1 x daily - 7 x weekly - 3 sets - 10 reps - Standing Hip Abduction with Unilateral Counter Support  - 1 x daily - 7 x weekly - 3 sets - 10 reps - Heel Toe Raises with Unilateral Counter Support  - 1 x daily - 7 x weekly - 3 sets - 10 reps  GOALS: Goals reviewed with patient? Yes  SHORT TERM GOALS: Target date: 11/15/2023  Pt will be independent with HEP in order to demonstrate increased ability to perform tasks related to occupation/hobbies. Baseline: pt given HEP at eval Goal status: INITIAL  LONG TERM GOALS: Target date: 01/10/2024  1.  Patient (> 77 years old) will complete five times sit to stand test in < 15 seconds indicating an increased LE strength and improved balance. Baseline: 24.36 sec Goal status: INITIAL  2.  Patient will improve SIS 16  score to 55   to demonstrate statistically significant improvement in mobility and quality of life as it relates to their CVA.  Baseline: 41 Goal status: INITIAL   3.  Patient will increase Berg Balance score by > 6 points to demonstrate decreased fall risk during functional activities. Baseline: 33/56 Goal status: INITIAL   4.  Patient will reduce timed up and go to <11 seconds to reduce fall risk and demonstrate improved transfer/gait ability. Baseline: 23.94 sec with walker Goal status: INITIAL  5.  Patient will increase 10 meter walk test to >1.28m/s as to improve gait speed for better community ambulation and to reduce fall risk. Baseline: 20.33 sec with walker Goal status: INITIAL  6.  Patient will increase 2 minute walk test distance by 50 ft or greater for progression to  community ambulator and demonstrate improved gait ability  Baseline: 275'; 83.82 m Goal status: INITIAL     ASSESSMENT:  CLINICAL IMPRESSION:  Pt again very fatigued upon arrival and requesting to not do too much that it caused her to dizziness to flare up and for her to be so fatigued she is unable to function over the weekend.  Pt otherwise is doing well and was able to tolerate an increase in overall resistance to the exercises.  Pt will be assessed at the next visit of improved functional mobility and strength for more      OBJECTIVE IMPAIRMENTS: Abnormal gait, decreased activity tolerance, decreased balance, decreased endurance, decreased knowledge of use of DME, decreased mobility, difficulty walking, decreased strength, impaired sensation, and pain.   ACTIVITY LIMITATIONS: carrying, lifting, bending, standing, squatting, stairs, transfers, bathing, toileting, dressing, hygiene/grooming, and locomotion level  PARTICIPATION LIMITATIONS: meal prep, cleaning, laundry, community activity, and yard work  PERSONAL FACTORS: Age and 3+ comorbidities:  PMH: of CVA, COPD, Dysarthria, Ataxia, TIA, T2DM, Back pain, scoliosis are also affecting patient's functional outcome.   REHAB POTENTIAL: Good  CLINICAL DECISION MAKING: Evolving/moderate complexity  EVALUATION COMPLEXITY: Moderate  PLAN:  PT FREQUENCY: 2x/week  PT DURATION: 12 weeks  PLANNED INTERVENTIONS: 97750- Physical Performance Testing, 97110-Therapeutic exercises, 97530- Therapeutic activity, 97112- Neuromuscular re-education, 97535- Self Care, [id number]- Manual therapy, [phone]- Gait training, Balance training, and Vestibular training  PLAN FOR NEXT SESSION:   Monitor SPO2 and get portable oxygen  if necessary  Endurance, balance, strength ( L>R), updated HEP, gait training  -Limiting seated rest breaks as tolerated to challenge endurance   Fonda Simpers, PT, DPT Physical Therapist - [facility]  [facility]  11/16/23, 4:36 PM

## 2023-11-16 NOTE — Therapy (Signed)
 OUTPATIENT OCCUPATIONAL THERAPY NEURO TREATMENT NOTE  Patient Name: Hannah Kirby MRN: [id number] DOB:09-17-1945, 78 y.o., female Today's Date: 11/16/2023  PCP: Dr. [name] Perry REFERRING PROVIDER: Dr. [name] Perry  END OF SESSION:  OT End of Session - 11/16/23 1401     Visit Number 4    Number of Visits 24    Date for Recertification  01/22/24    OT Start Time 1402    OT Stop Time 1445    OT Time Calculation (min) 43 min    Activity Tolerance Patient tolerated treatment well    Behavior During Therapy [facility] for tasks assessed/performed;Flat affect           Past Medical History:  Diagnosis Date   Acute hemorrhagic colitis 08/08/2019   Anemia    C. difficile colitis 08/09/2019   CAD (coronary artery disease)    a. 02/2006 PCI: BMS x 2 to RCA, cath o/w without significant coronary disease; b. nuclear stress test 07/2014: No ischemia/infarct; c. 11/2017 MV: no isch/infarct, EF 55-65%; d. 03/2020 NSTEMI/PCI: LM nl, LAD min irregs, RI 25, small, LCX nl, RCA 30p/m ISR, 99d (3.0x15 Resolute Onyx DES).   Cataract 02/04/2018   Cerebrovascular accident (CVA) (HCC) 03/18/2023   dysarthria   Chronic bronchitis (HCC)    secondary to cigarette smoking   FHx: allergies    Goiter    Granulomatous disease    Hernia    Kidney stone on left side 2013   NSTEMI (non-ST elevated myocardial infarction) (HCC) 03/23/2020   Oxygen  deficiency    Panic attacks    PVC's (premature ventricular contractions)    a. 03/2018 Zio: Occas PVCs (2.5%). Triggered events assoc w/ PVC/PAC.   Retinal detachment, left 03/04/2020   Retinal tear 2020   Stroke (HCC) 10/29/2016   mild left side weakness   Stroke (HCC) 08/01/2019   Tobacco abuse    Past Surgical History:  Procedure Laterality Date   ABDOMINAL HYSTERECTOMY     BREAST SURGERY     CATARACT EXTRACTION W/PHACO Right 03/01/2017   Procedure: CATARACT EXTRACTION PHACO AND INTRAOCULAR LENS PLACEMENT (IOC) RIGHT DIABETIC;  Surgeon: Mittie Gaskin, MD;  Location: [facility];  Service: Ophthalmology;  Laterality: Right;   CATARACT EXTRACTION W/PHACO Left 03/22/2017   Procedure: CATARACT EXTRACTION PHACO AND INTRAOCULAR LENS PLACEMENT (IOC) LEFT DIABETIC;  Surgeon: Mittie Gaskin, MD;  Location: [facility];  Service: Ophthalmology;  Laterality: Left;  Diabetic - insulin  and oral meds   COLONOSCOPY WITH PROPOFOL  N/A 09/23/2014   Procedure: COLONOSCOPY WITH PROPOFOL ;  Surgeon: Gladis RAYMOND Mariner, MD;  Location: [facility] ENDOSCOPY;  Service: Endoscopy;  Laterality: N/A;   COLONOSCOPY WITH PROPOFOL  N/A 01/11/2018   Procedure: COLONOSCOPY WITH PROPOFOL ;  Surgeon: Mariner Gladis RAYMOND, MD;  Location: [facility] ENDOSCOPY;  Service: Endoscopy;  Laterality: N/A;   COLONOSCOPY WITH PROPOFOL  N/A 04/24/2018   Procedure: COLONOSCOPY WITH PROPOFOL ;  Surgeon: Mariner Gladis RAYMOND, MD;  Location: [facility] ENDOSCOPY;  Service: Endoscopy;  Laterality: N/A;   COLONOSCOPY WITH PROPOFOL  N/A 11/19/2019   Procedure: COLONOSCOPY WITH PROPOFOL ;  Surgeon: Jinny Carmine, MD;  Location: [facility] ENDOSCOPY;  Service: Endoscopy;  Laterality: N/A;   CORONARY ANGIOPLASTY WITH STENT PLACEMENT  2008   CORONARY STENT INTERVENTION N/A 03/24/2020   Procedure: CORONARY STENT INTERVENTION;  Surgeon: Mady Bruckner, MD;  Location: [facility] INVASIVE CV LAB;  Service: Cardiovascular;  Laterality: N/A;   ESOPHAGOGASTRODUODENOSCOPY (EGD) WITH PROPOFOL  N/A 12/30/2014   Procedure: ESOPHAGOGASTRODUODENOSCOPY (EGD) WITH PROPOFOL ;  Surgeon: Gladis RAYMOND Mariner, MD;  Location: [facility]  ENDOSCOPY;  Service: Endoscopy;  Laterality: N/A;   ESOPHAGOGASTRODUODENOSCOPY (EGD) WITH PROPOFOL  N/A 07/19/2016   Procedure: ESOPHAGOGASTRODUODENOSCOPY (EGD) WITH PROPOFOL ;  Surgeon: Gaylyn Gladis PENNER, MD;  Location: [facility] ENDOSCOPY;  Service: Endoscopy;  Laterality: N/A;   ESOPHAGOGASTRODUODENOSCOPY (EGD) WITH PROPOFOL  N/A 04/24/2018   Procedure: ESOPHAGOGASTRODUODENOSCOPY (EGD) WITH PROPOFOL ;  Surgeon:  Gaylyn Gladis PENNER, MD;  Location: [facility] ENDOSCOPY;  Service: Endoscopy;  Laterality: N/A;   EYE SURGERY  cataracts, detached retina   hysterectomy (other)     LEFT HEART CATH AND CORONARY ANGIOGRAPHY N/A 03/24/2020   Procedure: LEFT HEART CATH AND CORONARY ANGIOGRAPHY;  Surgeon: Mady Bruckner, MD;  Location: [facility] INVASIVE CV LAB;  Service: Cardiovascular;  Laterality: N/A;   Patient Active Problem List   Diagnosis Date Noted   Stroke (HCC) 08/05/2023   Depression with anxiety 08/05/2023   Overweight (BMI 25.0-29.9) 08/05/2023   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) 03/17/2023   Dizziness 02/23/2023   Restrictive airway disease 07/21/2020   Partial thickness rotator cuff tear 03/20/2020   Shoulder pain 03/05/2020   Ataxia 02/28/2020   Difficulty walking 02/28/2020   Physical deconditioning 02/28/2020   Chronic diastolic congestive heart failure (HCC) 11/20/2019   Polyp of ascending colon    Hypokalemia 08/08/2019   TIA (transient ischemic attack) 08/01/2019   Benign hypertensive kidney disease with chronic kidney disease 04/25/2019   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/24/2018   Chronic, continuous use of opioids 03/26/2018   History of adenomatous polyp of colon 01/22/2018   Diverticulosis of colon 01/22/2018   History of CVA (cerebrovascular accident) 11/08/2016   Weakness of left upper extremity 10/30/2016   AAA (abdominal aortic aneurysm) without rupture 08/12/2016   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   Stage 3b chronic kidney disease (HCC) 07/27/2015   Type 2 diabetes mellitus with diabetic chronic kidney disease (HCC) 10/28/2014   Solitary pulmonary nodule on lung CT 08/20/2014   Allergic rhinitis 07/28/2014   Acid reflux 07/28/2014   Chronic obstructive pulmonary disease (HCC) 07/28/2014   Granuloma annulare    Back pain 11/12/2013   Lumbar scoliosis 10/30/2013   Lumbar radiculopathy 10/30/2013   Lumbar canal stenosis  10/30/2013   Proteinuria 10/22/2013   Smokes with greater than 30 pack year history 03/21/2011   Edema 08/18/2010   Hyperlipidemia 03/25/2009   Coronary artery disease 03/25/2009   Primary hypertension 03/25/2009   ONSET DATE: 08/05/2023  REFERRING DIAG: M70.101 (ICD-10-CM) - Weakness of left upper extremity   THERAPY DIAG:  Other lack of coordination  Muscle weakness (generalized)  Rationale for Evaluation and Treatment: Rehabilitation  SUBJECTIVE:  SUBJECTIVE STATEMENT: Pt. Reports ENT appointment coming up to address possible inner ear difficulties causing dizziness.  Pt accompanied by: family member: daughter, Harrie  PERTINENT HISTORY: Daughter present and provided hx.  Daughter reports pt has had 4 strokes, beginning in 2018, 2019, January of 2025 and the most recent in June of 2025.  Pt had been participating in [facility] since the most recent CVA, and is now eager to transition to outpatient services where PT/OT/SLP have been ordered. Per MEDICAL RECORD NUMBERPMH: of CVA, COPD, Dysarthria, Ataxia, TIA, T2DM, Back pain, scoliosis   PRECAUTIONS: Fall, Loop recorder for Afib  WEIGHT BEARING RESTRICTIONS: No  PAIN: 3/10 chronic back pain 11/08/23: Same as below 11/02/23: same as eval below Are you having pain? Yes: NPRS scale: 3/10, can get up to 7-8/10 Pain location: low back and L hip Pain description: achy, sharp, dull Aggravating factors: prolonged standing  Relieving factors:  sitting, pain meds, heat/ice   FALLS: Has patient fallen in last 6 months? Yes. Number of falls 1, tripping over cords  LIVING ENVIRONMENT: Lives with: lives with their spouse Lives in: 1 level home Stairs: Yes: External: 1 steps; none Has following equipment at home: walk in shower, comfort height toilet   PLOF: Prior to Jan of this year, pt was managing all home management tasks, was able to drive, and was caring for spouse   PATIENT GOALS: Strengthening the left arm   OBJECTIVE:  Note: Objective  measures were completed at Evaluation unless otherwise noted.  HAND DOMINANCE: Right  ADLs: Overall ADLs: Daughter is pt's primary caregiver and lives near by Transfers/ambulation related to ADLs: occasional help with using walker at home, depending on balance on a given day Eating: indep/able to cut food  Grooming: bimanual grooming without difficulty UB Dressing: extra time for clothing fasteners, but able to gather clothing with RW LB Dressing: extra time with clothing fasteners Toileting: occasional help to walk to the bathroom, but modified indep within the bathroom for toileting tasks Bathing: direct supv for bathing using walkin in shower, shower chair, and grab bar; gets hair washed weekly at salon Tub Shower transfers: supv for walk in shower Equipment: RW, SBQC, shower chair, grab bars  IADLs: Shopping: daughter currently managing Light housekeeping: daughter currently managing; can start laundry on a good day.  Meal Prep: starting to participate in light meal prep, including making eggs on stove top Community mobility: daughter assists with all transportation; pt using transport chair to reach therapy gym today Medication management: daughter sets up pills 2 weeks at a time  Financial management: pt is participating in bill paying with daughter's assistance; most are online Handwriting: 100% legible  POSTURE COMMENTS:  rounded shoulders, forward head, increased thoracic kyphosis  ACTIVITY TOLERANCE: Activity tolerance: to be further assessed within functional contexts  FUNCTIONAL OUTCOME MEASURES: TBD  UPPER EXTREMITY ROM:  BUEs WFL for daily tasks  UPPER EXTREMITY MMT:  Hx of R rotator cuff tear 4-5 years ago with residual weakness, non-surgical;    MMT Right eval Left eval  Shoulder flexion 3+ 4  Shoulder abduction 3+ 4  Shoulder adduction    Shoulder extension    Shoulder internal rotation 3+ 4-  Shoulder external rotation 3+ 3+  Elbow flexion 5 5   Elbow extension 5 5  Wrist flexion 4+ 5  Wrist extension 4+ 5  Wrist ulnar deviation    Wrist radial deviation    Wrist pronation    Wrist supination    (Blank rows = not tested)   *Most recent CVA affecting L non-dominant side, with LUE presenting with increased strength as compared to dominant/unaffected arm.  Assume RUE weaker from hx of rotator cuff tear, or possibly previous CVAs.  Pt does present with mild LUE ataxia, and bilat hand weakness.  HAND FUNCTION: Grip strength: Right: 32 lbs; Left: 30 lbs, Lateral pinch: Right: 8 lbs, Left: 6 lbs, and 3 point pinch: Right: 7 lbs, Left: 6 lbs  COORDINATION: Finger Nose Finger test: LUE mild ataxia  9 Hole Peg test: Right: 29 sec; Left: 47 sec  SENSATION: Pt reports intermittent numbness in the R thumb, IF, and LF  EDEMA: No visible edema  MUSCLE TONE: RUE: Within functional limits and LUE: Within functional limits  COGNITION: Overall cognitive status: Impaired; see SLP eval for details  VISION: Pt reports L eye feels more blurry since first stroke in 2018; pt has reading glasses but doesn't  usually wear them   VISION ASSESSMENT: To be further assessed in functional context Tracking/Visual pursuits: Able to track stimulus in all quads without difficulty Saccades: WFL Visual Fields: no apparent deficits Clock drawing completed: all numbers spaced evenly, but pt left out the 1 and verbalized not knowing how to draw ten minutes to eleven when this direction was given.  Patient has difficulty with following activities due to following visual impairments: To be further assessed  PERCEPTION: To be further assessed  PRAXIS: Impaired: Motor planning  OBSERVATIONS: Pt pleasant, cooperative.  Daughter present and very supportive in pt's care.                                                                                                                       TREATMENT DATE: 11/16/23  Therapeutic Activtity:  -Pt. Worked on Customer service manager, and [facility] skills manipulating a Klask 1/2  thick control magnet with vision occluded, while guiding a 1/4 magnet with it through mazes of progressively increasing degrees of complexity. Final maze pt required cues to complete, citing fatigue.    Therapeutic Exercise:   - Green theraputty issued and HEP reviewed. Completed gross grip loop, lateral pinch, 3pt. pinch, gross digit extension, digit extension table spread, and thumb opposition.  Pt. required verbal and tactile cues for proper technique. -Lateral, and 3pt. Pinch strengthening using yellow, red, green, and blue, and black level resistive clips. First trial placed on horizontal dowel rod at shoulder height with arm extended, 2nd trial vertical dowel, achieves 1 black clip with noted tremor.    Neuromuscular re-education:   -Facilitated L hand FMC utilizing [employer] and bingo chips to follow pattern and cover dots - focus on storing objects in palm and palm to fingertip translation with pt dropping 5/40 chips.  Focus on tip to tip pinch to grasp chips from board and store in palm, achieves ~10 chips, ultimately requires use of table edge to grasp chips from tabletop.     PATIENT EDUCATION: Education details: HEP Person educated: Patient and Child(ren) Education method: Programmer, multimedia, Facilities manager, Verbal cues, and Handouts Education comprehension: verbalized understanding and needs further education  HOME EXERCISE PROGRAM: Yellow theraputty  GOALS: Goals reviewed with patient? Yes  SHORT TERM GOALS: Target date: 12/11/23  Pt will perform HEP with min vc or less for improving LUE coordination and bilat hand strength. Baseline: Eval: HEP not yet initiated Goal status: INITIAL  2.  Pt will be indep to verbalize and implement 2-3 fall prevention measures to reduce fall risk with ADLs.  Baseline: Eval: Educ not yet initiated  Goal status: INITIAL  LONG TERM GOALS: Target date: 01/22/24  Pt will perform shower  transfers with modified indep Baseline: Eval: Direct supv Goal status: INITIAL  2.  Pt will improve LUE GMC to enable reaching with good accuracy into overhead kitchen cabinets. Baseline: Eval: Mild LUE ataxia  Goal status: INITIAL  3.  Pt will improve L hand [facility] skills as demonstrated by 10 sec or more  improvement in 9 hole peg test for improved efficiency when manipulating  clothing fasteners. Baseline: Eval: L 9 hole peg test: 47 sec, R 29 sec Goal status: INITIAL  4.  Pt will manage light loads of laundry with modified indep. Baseline: Eval: Daughter mostly manages, but pt can start laundry on a good day Goal status: INITIAL  ASSESSMENT:  CLINICAL IMPRESSION:  Pt reports she did her husbands laundry and was fatigued from standing/bending over. Reports ongoing intermittent dizziness, none this session, plan for ENT follow up. Continues to require cues to engage the left hand during [facility] tasks. Improved translatory movements with the left hand but difficulty picking flat chips up from tabletop. Green theraputty issued and HEP reviewed. Completed Klask board with pt reporting cognitive fatigue. Pt continues to benefit from skilled OT to address above noted deficits while working to maximize Pt's level of indep with ADL/IADL tasks in order to reduce burden of care on daughter and improve QOL.     PERFORMANCE DEFICITS: in functional skills including ADLs, IADLs, coordination, dexterity, sensation, ROM, strength, pain, Fine motor control, Gross motor control, mobility, balance, body mechanics, endurance, decreased knowledge of precautions, decreased knowledge of use of DME, vision, and UE functional use, cognitive skills including emotional, perception, problem solving, safety awareness, temperament/personality, and understand, and psychosocial skills including coping strategies, environmental adaptation, and routines and behaviors.   IMPAIRMENTS: are limiting patient from ADLs, IADLs,  leisure, and social participation.   CO-MORBIDITIES: has co-morbidities such as afib, AAA, hx of multiple CVAs that affects occupational performance. Patient will benefit from skilled OT to address above impairments and improve overall function.  MODIFICATION OR ASSISTANCE TO COMPLETE EVALUATION: Min-Moderate modification of tasks or assist with assess necessary to complete an evaluation.  OT OCCUPATIONAL PROFILE AND HISTORY: Detailed assessment: Review of records and additional review of physical, cognitive, psychosocial history related to current functional performance.  CLINICAL DECISION MAKING: Moderate - several treatment options, min-mod task modification necessary  REHAB POTENTIAL: Good  EVALUATION COMPLEXITY: Moderate    PLAN:  OT FREQUENCY: 2x/week  OT DURATION: 12 weeks  PLANNED INTERVENTIONS: 97168 OT Re-evaluation, 97535 self care/ADL training, 02889 therapeutic exercise, 97530 therapeutic activity, 97112 neuromuscular re-education, 97140 manual therapy, 97116 gait training, 02989 moist heat, 97010 cryotherapy, 97129 Cognitive training (first 15 min), 02249 Physical Performance Testing, balance training, functional mobility training, visual/perceptual remediation/compensation, psychosocial skills training, energy conservation, coping strategies training, patient/family education, and DME and/or AE instructions  RECOMMENDED OTHER SERVICES: none at this time (OT/PT/SLP services have all been ordered)  CONSULTED AND AGREED WITH PLAN OF CARE: Patient and family member/caregiver  PLAN FOR NEXT SESSION: see above  Elston Slot, M.S. OTR/L  11/16/23, 2:01 PM  ascom [phone]  11/16/2023, 2:01 PM

## 2023-11-20 ENCOUNTER — Ambulatory Visit (INDEPENDENT_AMBULATORY_CARE_PROVIDER_SITE_OTHER)

## 2023-11-20 DIAGNOSIS — I639 Cerebral infarction, unspecified: Secondary | ICD-10-CM

## 2023-11-20 LAB — CUP PACEART REMOTE DEVICE CHECK
Date Time Interrogation Session: 20250928235452
Implantable Pulse Generator Implant Date: 20250626

## 2023-11-21 ENCOUNTER — Ambulatory Visit

## 2023-11-21 ENCOUNTER — Ambulatory Visit: Admitting: Physical Therapy

## 2023-11-21 DIAGNOSIS — H903 Sensorineural hearing loss, bilateral: Secondary | ICD-10-CM | POA: Diagnosis not present

## 2023-11-21 DIAGNOSIS — R2689 Other abnormalities of gait and mobility: Secondary | ICD-10-CM | POA: Diagnosis not present

## 2023-11-21 DIAGNOSIS — R4701 Aphasia: Secondary | ICD-10-CM

## 2023-11-21 DIAGNOSIS — R269 Unspecified abnormalities of gait and mobility: Secondary | ICD-10-CM | POA: Diagnosis not present

## 2023-11-21 DIAGNOSIS — R262 Difficulty in walking, not elsewhere classified: Secondary | ICD-10-CM | POA: Diagnosis not present

## 2023-11-21 DIAGNOSIS — R42 Dizziness and giddiness: Secondary | ICD-10-CM

## 2023-11-21 DIAGNOSIS — R2681 Unsteadiness on feet: Secondary | ICD-10-CM

## 2023-11-21 DIAGNOSIS — M6281 Muscle weakness (generalized): Secondary | ICD-10-CM

## 2023-11-21 DIAGNOSIS — R482 Apraxia: Secondary | ICD-10-CM

## 2023-11-21 DIAGNOSIS — R278 Other lack of coordination: Secondary | ICD-10-CM

## 2023-11-21 NOTE — Therapy (Signed)
 OUTPATIENT SPEECH LANGUAGE PATHOLOGY APHASIA TREATMENT   Patient Name: Hannah Kirby MRN: [id number] DOB:08-25-1945, 78 y.o., female Today's Date: 11/21/2023  PCP: [name] Perry, MD REFERRING PROVIDER: [name] Perry, MD   End of Session - 11/21/23 1445     Visit Number 9    Number of Visits 23    Date for Recertification  01/09/24    SLP Start Time 1445    SLP Stop Time  1530    SLP Time Calculation (min) 45 min    Activity Tolerance Patient tolerated treatment well          Past Medical History:  Diagnosis Date   Acute hemorrhagic colitis 08/08/2019   Anemia    C. difficile colitis 08/09/2019   CAD (coronary artery disease)    a. 02/2006 PCI: BMS x 2 to RCA, cath o/w without significant coronary disease; b. nuclear stress test 07/2014: No ischemia/infarct; c. 11/2017 MV: no isch/infarct, EF 55-65%; d. 03/2020 NSTEMI/PCI: LM nl, LAD min irregs, RI 25, small, LCX nl, RCA 30p/m ISR, 99d (3.0x15 Resolute Onyx DES).   Cataract 02/04/2018   Cerebrovascular accident (CVA) (HCC) 03/18/2023   dysarthria   Chronic bronchitis (HCC)    secondary to cigarette smoking   FHx: allergies    Goiter    Granulomatous disease    Hernia    Kidney stone on left side 2013   NSTEMI (non-ST elevated myocardial infarction) (HCC) 03/23/2020   Oxygen  deficiency    Panic attacks    PVC's (premature ventricular contractions)    a. 03/2018 Zio: Occas PVCs (2.5%). Triggered events assoc w/ PVC/PAC.   Retinal detachment, left 03/04/2020   Retinal tear 2020   Stroke (HCC) 10/29/2016   mild left side weakness   Stroke (HCC) 08/01/2019   Tobacco abuse    Past Surgical History:  Procedure Laterality Date   ABDOMINAL HYSTERECTOMY     BREAST SURGERY     CATARACT EXTRACTION W/PHACO Right 03/01/2017   Procedure: CATARACT EXTRACTION PHACO AND INTRAOCULAR LENS PLACEMENT (IOC) RIGHT DIABETIC;  Surgeon: Mittie Gaskin, MD;  Location: [facility];  Service: Ophthalmology;  Laterality: Right;    CATARACT EXTRACTION W/PHACO Left 03/22/2017   Procedure: CATARACT EXTRACTION PHACO AND INTRAOCULAR LENS PLACEMENT (IOC) LEFT DIABETIC;  Surgeon: Mittie Gaskin, MD;  Location: [facility];  Service: Ophthalmology;  Laterality: Left;  Diabetic - insulin  and oral meds   COLONOSCOPY WITH PROPOFOL  N/A 09/23/2014   Procedure: COLONOSCOPY WITH PROPOFOL ;  Surgeon: Gladis RAYMOND Mariner, MD;  Location: [facility] ENDOSCOPY;  Service: Endoscopy;  Laterality: N/A;   COLONOSCOPY WITH PROPOFOL  N/A 01/11/2018   Procedure: COLONOSCOPY WITH PROPOFOL ;  Surgeon: Mariner Gladis RAYMOND, MD;  Location: [facility] ENDOSCOPY;  Service: Endoscopy;  Laterality: N/A;   COLONOSCOPY WITH PROPOFOL  N/A 04/24/2018   Procedure: COLONOSCOPY WITH PROPOFOL ;  Surgeon: Mariner Gladis RAYMOND, MD;  Location: [facility] ENDOSCOPY;  Service: Endoscopy;  Laterality: N/A;   COLONOSCOPY WITH PROPOFOL  N/A 11/19/2019   Procedure: COLONOSCOPY WITH PROPOFOL ;  Surgeon: Jinny Carmine, MD;  Location: [facility] ENDOSCOPY;  Service: Endoscopy;  Laterality: N/A;   CORONARY ANGIOPLASTY WITH STENT PLACEMENT  2008   CORONARY STENT INTERVENTION N/A 03/24/2020   Procedure: CORONARY STENT INTERVENTION;  Surgeon: Mady Bruckner, MD;  Location: [facility] INVASIVE CV LAB;  Service: Cardiovascular;  Laterality: N/A;   ESOPHAGOGASTRODUODENOSCOPY (EGD) WITH PROPOFOL  N/A 12/30/2014   Procedure: ESOPHAGOGASTRODUODENOSCOPY (EGD) WITH PROPOFOL ;  Surgeon: Gladis RAYMOND Mariner, MD;  Location: [facility] ENDOSCOPY;  Service: Endoscopy;  Laterality: N/A;   ESOPHAGOGASTRODUODENOSCOPY (EGD) WITH PROPOFOL  N/A  07/19/2016   Procedure: ESOPHAGOGASTRODUODENOSCOPY (EGD) WITH PROPOFOL ;  Surgeon: Gaylyn Gladis PENNER, MD;  Location: [facility] ENDOSCOPY;  Service: Endoscopy;  Laterality: N/A;   ESOPHAGOGASTRODUODENOSCOPY (EGD) WITH PROPOFOL  N/A 04/24/2018   Procedure: ESOPHAGOGASTRODUODENOSCOPY (EGD) WITH PROPOFOL ;  Surgeon: Gaylyn Gladis PENNER, MD;  Location: [facility] ENDOSCOPY;  Service: Endoscopy;  Laterality: N/A;    EYE SURGERY  cataracts, detached retina   hysterectomy (other)     LEFT HEART CATH AND CORONARY ANGIOGRAPHY N/A 03/24/2020   Procedure: LEFT HEART CATH AND CORONARY ANGIOGRAPHY;  Surgeon: Mady Bruckner, MD;  Location: [facility] INVASIVE CV LAB;  Service: Cardiovascular;  Laterality: N/A;   Patient Active Problem List   Diagnosis Date Noted   Stroke (HCC) 08/05/2023   Depression with anxiety 08/05/2023   Overweight (BMI 25.0-29.9) 08/05/2023   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) 03/17/2023   Dizziness 02/23/2023   Restrictive airway disease 07/21/2020   Partial thickness rotator cuff tear 03/20/2020   Shoulder pain 03/05/2020   Ataxia 02/28/2020   Difficulty walking 02/28/2020   Physical deconditioning 02/28/2020   Chronic diastolic congestive heart failure (HCC) 11/20/2019   Polyp of ascending colon    Hypokalemia 08/08/2019   TIA (transient ischemic attack) 08/01/2019   Benign hypertensive kidney disease with chronic kidney disease 04/25/2019   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/24/2018   Chronic, continuous use of opioids 03/26/2018   History of adenomatous polyp of colon 01/22/2018   Diverticulosis of colon 01/22/2018   History of CVA (cerebrovascular accident) 11/08/2016   Weakness of left upper extremity 10/30/2016   AAA (abdominal aortic aneurysm) without rupture 08/12/2016   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   Stage 3b chronic kidney disease (HCC) 07/27/2015   Type 2 diabetes mellitus with diabetic chronic kidney disease (HCC) 10/28/2014   Solitary pulmonary nodule on lung CT 08/20/2014   Allergic rhinitis 07/28/2014   Acid reflux 07/28/2014   Chronic obstructive pulmonary disease (HCC) 07/28/2014   Granuloma annulare    Back pain 11/12/2013   Lumbar scoliosis 10/30/2013   Lumbar radiculopathy 10/30/2013   Lumbar canal stenosis 10/30/2013   Proteinuria 10/22/2013   Smokes with greater than 30 pack year history 03/21/2011    Edema 08/18/2010   Hyperlipidemia 03/25/2009   Coronary artery disease 03/25/2009   Primary hypertension 03/25/2009    ONSET DATE: 03/17/23 (admitted with stroke), 05/23/23 (referral date)  REFERRING DIAG:  R41.841 (ICD-10-CM) - Cognitive communication deficit  I63.9 (ICD-10-CM) - CVA (cerebral vascular accident) (HCC)  R47.02 (ICD-10-CM) - Dysphasia    THERAPY DIAG:  Aphasia  Apraxia of speech  Rationale for Evaluation and Treatment Rehabilitation  SUBJECTIVE:   SUBJECTIVE STATEMENT: Pt alert, pleasant, and cooperative.  Pt accompanied by: self and family member  PERTINENT HISTORY:  Pt is a 78 y.o. female who presents for communication evaluation in setting on stroke. Pt in ED 1/24-1/25/25. Pt d/c'd home with HH ST. PMHx significant of   HTN, CKD stage IIIB, DMT2 on insulin , CAD, chronic smoking, chronic respiratory failure with COPD and on home oxygen , anxiety, HLD. MRI 03/17/23 1. Punctate foci of restricted diffusion in the left posterior  frontal white matter, most likely acute infarcts.  2. Numerous foci of hemosiderin deposition, which are seen in the  deep gray structures but also in the bilateral cerebral hemispheres,  with 1 new focus compared to 02/20/2023. While this could be the  sequela of chronic hypertensive microhemorrhages, this appearance is  concerning for cerebral amyloid angiopathy.   PAIN:  Are you having pain? No  FALLS: defer to PT  LIVING ENVIRONMENT: Lives with: lives with their spouse Lives in: House/apartment  PLOF:  Level of assistance: Independent with ADLs Employment: Retired   PATIENT GOALS    for communication to improve   OBJECTIVE:  TODAY'S TREATMENT:  Pt brought in remaining items to return loaner SGD.   Pt generated sentences for pop culture pictures from the 1970s with min/mod cues. Mild anomia and perseveration persist. Improving ability to self correct noted.      PATIENT EDUCATION: Education details: as  above Person educated: Patient and Child Education method: Explanation Education comprehension: verbalized understanding and needs further education  HOME EXERCISE PROGRAM:    SGD home practice worksheet    GOALS:  Goals reviewed with patient? Yes  SHORT TERM GOALS: Target date: 10 sessions  Pt will participate in further assessment of functional reading/writing. Baseline: Goal status: INITIAL  2.  With Moderate A, patient will complete a semantic feature analysis with at least 2 relevant features for 8/10 target words to improve word-finding skills.  Baseline:  Goal status: INITIAL  3.  With Maximal A, patient will generate sentences with 3 or more words in response to a situation at 80% accuracy in order to increase ability to communicate basic wants and needs.  Baseline:  Goal status: INITIAL  4.   With Maximal A,  pt will follow 2-step commands for improved participation in ADLs/iADLs with max cueing. Baseline:  Goal status: INITIAL  5.  Pt will repeat short sentences (<4 words) with good approximations 80% of the time with max cueing. Baseline:  Goal status: INITIAL    LONG TERM GOALS: Target date: 12 weeks  Pt will report a subjective improvement in communication per PROM.  Baseline: CES 12/32 on 8/26 Goal status: INITIAL  2.  With Min A, patient/family will demonstrate understanding of the following concepts: aphasia, spontaneous recovery, communication vs conversation, strengths/strategies to promote success, local resources in order to increase patient's participation in medical care.    Baseline:  Goal status: INITIAL   ASSESSMENT:  CLINICAL IMPRESSION: Pt is a 78 y.o. female who presents for communication treatment in setting on stroke. Pt known to SLP services and was receiving OP ST prior to most recent stroke 08/05/23. Pt d/c'd home with [facility] ST who is working on securing SGD for pt. PMHx significant of  HTN, CKD stage IIIB, DMT2 on insulin , CAD,  chronic smoking, chronic respiratory failure with COPD and on home oxygen , anxiety, HLD. Assessment this episode completed via functional/dynamic means including Western Aphasia Battery Revised (WAB-R) and PROM (Communication Effectiveness Scale). 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 See details of tx session above. Recommend course of ST targeting functional communication, further assessment of functional reading/writing, and pt/caregiver training to help promote QoL and overall life participation.   OBJECTIVE IMPAIRMENTS include expressive language, receptive language, and aphasia. These impairments are limiting patient from ADLs/IADLs and effectively communicating at home and in community. Factors affecting potential to achieve goals and functional outcome are severity of impairments. Patient will benefit from skilled SLP services to address above impairments and improve overall function.  REHAB POTENTIAL: Good  PLAN: SLP FREQUENCY: 2x/week  SLP DURATION: 12 weeks  PLANNED INTERVENTIONS: Language facilitation, Cueing hierachy, Internal/external aids, Functional tasks, Multimodal communication approach, SLP instruction and feedback, Compensatory strategies, and Patient/family education    Delon Bangs, M.S., CCC-SLP Speech-Language Pathologist [facility] [phone] (  ASCOM)  [facility] at [facility] [address] [place], [place], [address] Phone: [phone]   Fax:  [phone]

## 2023-11-21 NOTE — Therapy (Signed)
 OUTPATIENT PHYSICAL THERAPY NEURO TREATMENT   Patient Name: MAIZY DAVANZO MRN: [id number] DOB:[birth year], 78 y.o., female Today's Date: 11/21/2023   PCP: Gasper [name] BRAVO, MD REFERRING PROVIDER: Gasper [name] BRAVO, MD  END OF SESSION:    PT End of Session - 11/21/23 1402     Visit Number 9    Number of Visits 25    Date for Recertification  01/09/24    Progress Note Due on Visit 10    PT Start Time 1403    PT Stop Time 1445    PT Time Calculation (min) 42 min    Equipment Utilized During Treatment Gait belt    Activity Tolerance Patient tolerated treatment well    Behavior During Therapy WFL for tasks assessed/performed;Flat affect          Past Medical History:  Diagnosis Date   Acute hemorrhagic colitis 08/08/2019   Anemia    C. difficile colitis 08/09/2019   CAD (coronary artery disease)    a. 02/2006 PCI: BMS x 2 to RCA, cath o/w without significant coronary disease; b. nuclear stress test 07/2014: No ischemia/infarct; c. 11/2017 MV: no isch/infarct, EF 55-65%; d. 03/2020 NSTEMI/PCI: LM nl, LAD min irregs, RI 25, small, LCX nl, RCA 30p/m ISR, 99d (3.0x15 Resolute Onyx DES).   Cataract 02/04/2018   Cerebrovascular accident (CVA) (HCC) 03/18/2023   dysarthria   Chronic bronchitis (HCC)    secondary to cigarette smoking   FHx: allergies    Goiter    Granulomatous disease    Hernia    Kidney stone on left side 2013   NSTEMI (non-ST elevated myocardial infarction) (HCC) 03/23/2020   Oxygen  deficiency    Panic attacks    PVC's (premature ventricular contractions)    a. 03/2018 Zio: Occas PVCs (2.5%). Triggered events assoc w/ PVC/PAC.   Retinal detachment, left 03/04/2020   Retinal tear 2020   Stroke (HCC) 10/29/2016   mild left side weakness   Stroke (HCC) 08/01/2019   Tobacco abuse    Past Surgical History:  Procedure Laterality Date   ABDOMINAL HYSTERECTOMY     BREAST SURGERY     CATARACT EXTRACTION W/PHACO Right 03/01/2017   Procedure: CATARACT EXTRACTION  PHACO AND INTRAOCULAR LENS PLACEMENT (IOC) RIGHT DIABETIC;  Surgeon: Mittie Gaskin, MD;  Location: [facility];  Service: Ophthalmology;  Laterality: Right;   CATARACT EXTRACTION W/PHACO Left 03/22/2017   Procedure: CATARACT EXTRACTION PHACO AND INTRAOCULAR LENS PLACEMENT (IOC) LEFT DIABETIC;  Surgeon: Mittie Gaskin, MD;  Location: [facility];  Service: Ophthalmology;  Laterality: Left;  Diabetic - insulin  and oral meds   COLONOSCOPY WITH PROPOFOL  N/A 09/23/2014   Procedure: COLONOSCOPY WITH PROPOFOL ;  Surgeon: Gladis RAYMOND Mariner, MD;  Location: [facility] ENDOSCOPY;  Service: Endoscopy;  Laterality: N/A;   COLONOSCOPY WITH PROPOFOL  N/A 01/11/2018   Procedure: COLONOSCOPY WITH PROPOFOL ;  Surgeon: Mariner Gladis RAYMOND, MD;  Location: [facility] ENDOSCOPY;  Service: Endoscopy;  Laterality: N/A;   COLONOSCOPY WITH PROPOFOL  N/A 04/24/2018   Procedure: COLONOSCOPY WITH PROPOFOL ;  Surgeon: Mariner Gladis RAYMOND, MD;  Location: [facility] ENDOSCOPY;  Service: Endoscopy;  Laterality: N/A;   COLONOSCOPY WITH PROPOFOL  N/A 11/19/2019   Procedure: COLONOSCOPY WITH PROPOFOL ;  Surgeon: Jinny Carmine, MD;  Location: [facility] ENDOSCOPY;  Service: Endoscopy;  Laterality: N/A;   CORONARY ANGIOPLASTY WITH STENT PLACEMENT  2008   CORONARY STENT INTERVENTION N/A 03/24/2020   Procedure: CORONARY STENT INTERVENTION;  Surgeon: Mady Bruckner, MD;  Location: [facility] INVASIVE CV LAB;  Service: Cardiovascular;  Laterality: N/A;  ESOPHAGOGASTRODUODENOSCOPY (EGD) WITH PROPOFOL  N/A 12/30/2014   Procedure: ESOPHAGOGASTRODUODENOSCOPY (EGD) WITH PROPOFOL ;  Surgeon: Gladis RAYMOND Mariner, MD;  Location: [facility] ENDOSCOPY;  Service: Endoscopy;  Laterality: N/A;   ESOPHAGOGASTRODUODENOSCOPY (EGD) WITH PROPOFOL  N/A 07/19/2016   Procedure: ESOPHAGOGASTRODUODENOSCOPY (EGD) WITH PROPOFOL ;  Surgeon: Mariner Gladis RAYMOND, MD;  Location: [facility] ENDOSCOPY;  Service: Endoscopy;  Laterality: N/A;   ESOPHAGOGASTRODUODENOSCOPY (EGD) WITH PROPOFOL  N/A  04/24/2018   Procedure: ESOPHAGOGASTRODUODENOSCOPY (EGD) WITH PROPOFOL ;  Surgeon: Mariner Gladis RAYMOND, MD;  Location: [facility] ENDOSCOPY;  Service: Endoscopy;  Laterality: N/A;   EYE SURGERY  cataracts, detached retina   hysterectomy (other)     LEFT HEART CATH AND CORONARY ANGIOGRAPHY N/A 03/24/2020   Procedure: LEFT HEART CATH AND CORONARY ANGIOGRAPHY;  Surgeon: Mady Bruckner, MD;  Location: [facility] INVASIVE CV LAB;  Service: Cardiovascular;  Laterality: N/A;   Patient Active Problem List   Diagnosis Date Noted   Stroke (HCC) 08/05/2023   Depression with anxiety 08/05/2023   Overweight (BMI 25.0-29.9) 08/05/2023   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) 03/17/2023   Dizziness 02/23/2023   Restrictive airway disease 07/21/2020   Partial thickness rotator cuff tear 03/20/2020   Shoulder pain 03/05/2020   Ataxia 02/28/2020   Difficulty walking 02/28/2020   Physical deconditioning 02/28/2020   Chronic diastolic congestive heart failure (HCC) 11/20/2019   Polyp of ascending colon    Hypokalemia 08/08/2019   TIA (transient ischemic attack) 08/01/2019   Benign hypertensive kidney disease with chronic kidney disease 04/25/2019   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/24/2018   Chronic, continuous use of opioids 03/26/2018   History of adenomatous polyp of colon 01/22/2018   Diverticulosis of colon 01/22/2018   History of CVA (cerebrovascular accident) 11/08/2016   Weakness of left upper extremity 10/30/2016   AAA (abdominal aortic aneurysm) without rupture 08/12/2016   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   Stage 3b chronic kidney disease (HCC) 07/27/2015   Type 2 diabetes mellitus with diabetic chronic kidney disease (HCC) 10/28/2014   Solitary pulmonary nodule on lung CT 08/20/2014   Allergic rhinitis 07/28/2014   Acid reflux 07/28/2014   Chronic obstructive pulmonary disease (HCC) 07/28/2014   Granuloma annulare    Back pain 11/12/2013   Lumbar  scoliosis 10/30/2013   Lumbar radiculopathy 10/30/2013   Lumbar canal stenosis 10/30/2013   Proteinuria 10/22/2013   Smokes with greater than 30 pack year history 03/21/2011   Edema 08/18/2010   Hyperlipidemia 03/25/2009   Coronary artery disease 03/25/2009   Primary hypertension 03/25/2009    ONSET DATE: 08/05/23  REFERRING DIAG:  Z86.73 (ICD-10-CM) - History of CVA (cerebrovascular accident)  R27.0 (ICD-10-CM) - Ataxia    THERAPY DIAG:    Other lack of coordination  Muscle weakness (generalized)  Difficulty in walking, not elsewhere classified  Unsteadiness on feet  Abnormality of gait and mobility  Other abnormalities of gait and mobility  Dizziness and giddiness  Rationale for Evaluation and Treatment: Rehabilitation  SUBJECTIVE:  SUBJECTIVE STATEMENT:  Pt reporting fatigue, as she has had ENT appointment this morning, followed by OT prior to PT session. Pt reports that she went to the ENT today, plan to start prednisone  if approved by MD that manages diabetes. Plan to do VNG testing in November.   No dizziness today; felt dizzy more last week. Describes as head spinning.    Pt accompanied by: self and family member, daughter  PERTINENT HISTORY:  PMH: of CVA, COPD, Dysarthria, Ataxia, TIA, T2DM, Back pain, scoliosis   PAIN:  Are you having pain? Yes: NPRS scale: 3/10 Pain location: low back and L hip Pain description: achy Aggravating factors: just hurts all the time. Relieving factors: hydrocodone  at night  PRECAUTIONS: Other: Pt has a looper to see if pt has a-fib  RED FLAGS: Abdominal aneurysm: Yes: pt reports having an abdominal aneurism as found by Dr. Marea.   WEIGHT BEARING RESTRICTIONS: No  FALLS: Has patient fallen in last 6 months? Yes. Number of falls 1.  Pt  reports losing balance, and got caught up in cords that were on the floor when she was trying to close the blinds.  LIVING ENVIRONMENT: Lives with: lives with their spouse Lives in: House/apartment Stairs: Yes: External: 1 steps; none Has following equipment at home: Single point cane, Walker - 2 wheeled, Environmental consultant - 4 wheeled, shower chair, and Grab bars  PLOF: Independent  PATIENT GOALS: Pt is wanting to gain independence.  Pt is wanting to improve endurance levels and improving speech with SLP.  OBJECTIVE:  Note: Objective measures were completed at Evaluation unless otherwise noted.  DIAGNOSTIC FINDINGS:   EXAM: MRI HEAD WITHOUT CONTRAST   MRA HEAD WITHOUT CONTRAST   MRA NECK WITHOUT AND WITH CONTRAST   IMPRESSION: 1. Small acute infarcts in the left frontal lobe, right parietal lobe, and left cerebellum. 2. Extensive chronic small vessel ischemic disease. 3. 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 4. Unchanged severe right A3 stenosis or segmental occlusion. 5. Moderate left P2 stenosis. 6. Limited assessment of the left vertebral artery origin, otherwise negative neck MRA.   COGNITION: Overall cognitive status: Within functional limits for tasks assessed   SENSATION: WFL, however pt notes having some numbness in the R hand.  COORDINATION: WFL  POSTURE: rounded shoulders, forward head, decreased lumbar lordosis, increased thoracic kyphosis, posterior pelvic tilt, and flexed trunk    LOWER EXTREMITY MMT:    MMT Right Eval Left Eval  Hip flexion 4 4  Hip extension    Hip abduction 4 4-  Hip adduction 4 4-  Hip internal rotation    Hip external rotation    Knee flexion 4 4-  Knee extension 4 4-  Ankle dorsiflexion 4 4-  Ankle plantarflexion    Ankle inversion    Ankle eversion    (Blank rows = not tested)  BED MOBILITY:  Not tested  FUNCTIONAL TESTS:  5 times sit to stand: 24.36 sec Timed up and go (TUG): 23.94 sec with walker 2 minute  walk test: TBD 10 meter walk test: 20.33 sec with walker Berg Balance Scale: TBD  PATIENT SURVEYS:   Stroke Impact Scale 16 (Copyrighted instrument, [facility])  In the past 2 weeks, how difficult was it to...  Rating Scale 5 = Not difficult at all 4 = A little difficult 3 = Somewhat difficult 2 = Very difficult 1 = Could not do at all  a. Dress the top part of your body? 4  b. Bathe yourself? 2  c. Get to the toilet on time? 5  d. Control your bladder (not have an accident)? 5  e. Control your bowels (not have an accident)? 5  f. Stand without losing balance? 3  g. Go shopping? 2  h. Do heavy household chores (e.g. vacuum, laundry or  yard work)? 1  i. Stay sitting without losing your balance? 3  j. Walk without losing your balance? 2  k. Move from a bed to a chair? 3  l. Walk fast? 1  m. Climb one flight of stairs? 1  n. Walk one block? 1  o. Get in and out of a car? 2  p. Carry heavy objects (e.g. bag of groceries) with your  affected hand? 1  Sum:  41/80  MDC (Minimal Detectable Change) is >/=8                                                                                                                                 TREATMENT DATE: 11/21/23  Pt arrived to session in transport chair, daughter present.   SpO2: 97-99% on room air; patient able to remain on room air for entire session with SpO2s below.  BP assessed: 158/101 (120).   Seated LAQ'd with 5# AW donned, 2x15 each LE Seated marches with 5# AW donned, 2x15 each LE O2 consistently at 93-95% with above exercises.   Gait 150' without AD; O2 consistently at 91-95% throughout; CGA for steadying.  RPE: 15-16/20.  Of note, pt with very guarded posture; head slightly down & to the left.  STS w/ airex in seat for increased seat height Requiring BUE support secondary to fatigue Spo2 to 87%; recover to 93% with pursed lip breathing in 15 sec.  RPE 16/20.   Seated resisted hip  abduction with GTB, 2x15   Reassessed BP: 163/92 (113)  Gait without AD to speech therapy office across hall, ~85'; CGA for steadying.  Of note, pt with very guarded posture; head slightly down & to the left.    Pt educated on progress with therapy, improved endurance as evidenced by less frequent desaturations on room air with exertion, improved ability to recover on room air with pursed lip breathing technique.    PATIENT EDUCATION: Education details: Pt educated on role of PT and services provided during current POC, along with prognosis and information about the clinic.  Person educated: Patient and Child(ren) Education method: Explanation Education comprehension: verbalized understanding and returned demonstration  HOME EXERCISE PROGRAM:  Access Code: [email or web address] URL: [email or web address] Date: 10/17/2023 Prepared by: Sidra Simpers  Exercises - Standing March with Counter Support  - 1 x daily - 3-4 x weekly - 3 sets - 10 reps - Standing Knee Flexion with Unilateral Counter Support  - 1 x daily - 7 x weekly - 3 sets - 10 reps - Standing Hip Extension with Unilateral Counter Support  - 1 x daily - 7 x weekly -  3 sets - 10 reps - Standing Hip Abduction with Unilateral Counter Support  - 1 x daily - 7 x weekly - 3 sets - 10 reps - Heel Toe Raises with Unilateral Counter Support  - 1 x daily - 7 x weekly - 3 sets - 10 reps  GOALS: Goals reviewed with patient? Yes  SHORT TERM GOALS: Target date: 11/15/2023  Pt will be independent with HEP in order to demonstrate increased ability to perform tasks related to occupation/hobbies. Baseline: pt given HEP at eval Goal status: INITIAL  LONG TERM GOALS: Target date: 01/10/2024  1.  Patient (> 30 years old) will complete five times sit to stand test in < 15 seconds indicating an increased LE strength and improved balance. Baseline: 24.36 sec Goal status: INITIAL  2.  Patient will improve SIS 16 score to 55   to  demonstrate statistically significant improvement in mobility and quality of life as it relates to their CVA.  Baseline: 41 Goal status: INITIAL   3.  Patient will increase Berg Balance score by > 6 points to demonstrate decreased fall risk during functional activities. Baseline: 33/56 Goal status: INITIAL   4.  Patient will reduce timed up and go to <11 seconds to reduce fall risk and demonstrate improved transfer/gait ability. Baseline: 23.94 sec with walker Goal status: INITIAL  5.  Patient will increase 10 meter walk test to >1.23m/s as to improve gait speed for better community ambulation and to reduce fall risk. Baseline: 20.33 sec with walker Goal status: INITIAL  6.  Patient will increase 2 minute walk test distance by 50 ft or greater for progression to community ambulator and demonstrate improved gait ability  Baseline: 275'; 83.82 m Goal status: INITIAL     ASSESSMENT:  CLINICAL IMPRESSION: Pt very fatigued upon arrival. Pt with new onset dizziness; updated POC to include vestibular assessment & treatment as appropriate, potentially including canalith repositioning. Pt able to tolerate an increase in activities this session with stable O2 saturations throughout session & no reports of dizziness. Pt demonstrating increased difficulty with STS this date secondary to fatigue. Pt with increased endurance compared to previous sessions as evidenced by less frequent O2 desaturations with exertion while remaining on room air, increased ability to recover from desaturations with pursed lip breathing technique while remaining on room air. During gait, pt with increased guarded posture compared to previous sessions. The pt will benefit from further skilled PT to improve these deficits in order to increase QOL and ease/safety with ADLs.    OBJECTIVE IMPAIRMENTS: Abnormal gait, decreased activity tolerance, decreased balance, decreased endurance, decreased knowledge of use of DME, decreased  mobility, difficulty walking, decreased strength, impaired sensation, and pain.   ACTIVITY LIMITATIONS: carrying, lifting, bending, standing, squatting, stairs, transfers, bathing, toileting, dressing, hygiene/grooming, and locomotion level  PARTICIPATION LIMITATIONS: meal prep, cleaning, laundry, community activity, and yard work  PERSONAL FACTORS: Age and 3+ comorbidities:  PMH: of CVA, COPD, Dysarthria, Ataxia, TIA, T2DM, Back pain, scoliosis are also affecting patient's functional outcome.   REHAB POTENTIAL: Good  CLINICAL DECISION MAKING: Evolving/moderate complexity  EVALUATION COMPLEXITY: Moderate  PLAN:  PT FREQUENCY: 2x/week  PT DURATION: 12 weeks  PLANNED INTERVENTIONS: 97750- Physical Performance Testing, 97110-Therapeutic exercises, 97530- Therapeutic activity, [id number]- Neuromuscular re-education, 97535- Self Care, [id number]- Manual therapy, [phone]- Gait training, [phone]- Canalith repositioning, Balance training, Stair training, and Vestibular training  PLAN FOR NEXT SESSION:  -Screen for vestibular dysfunction related to dizziness -Progress note Monitor SPO2 and get portable oxygen  if necessary  Endurance, balance, strength ( L>R), updated HEP, gait training  -Limiting seated rest breaks as tolerated to challenge endurance   Chiquita Silvan, SPT Physical Therapy Student - [facility]  11/21/23, 4:41 PM

## 2023-11-21 NOTE — Therapy (Signed)
 OUTPATIENT OCCUPATIONAL THERAPY NEURO TREATMENT NOTE  Patient Name: Hannah Kirby MRN: [id number] DOB:[birth year], 78 y.o., female Today's Date: 11/21/2023  PCP: Dr. [name] Perry REFERRING PROVIDER: Dr. [name] Perry  END OF SESSION:  OT End of Session - 11/21/23 1958     Visit Number 5    Number of Visits 24    Date for Recertification  01/22/24    OT Start Time 1315    OT Stop Time 1400    OT Time Calculation (min) 45 min    Activity Tolerance Patient tolerated treatment well    Behavior During Therapy [facility] for tasks assessed/performed         Past Medical History:  Diagnosis Date   Acute hemorrhagic colitis 08/08/2019   Anemia    C. difficile colitis 08/09/2019   CAD (coronary artery disease)    a. 02/2006 PCI: BMS x 2 to RCA, cath o/w without significant coronary disease; b. nuclear stress test 07/2014: No ischemia/infarct; c. 11/2017 MV: no isch/infarct, EF 55-65%; d. 03/2020 NSTEMI/PCI: LM nl, LAD min irregs, RI 25, small, LCX nl, RCA 30p/m ISR, 99d (3.0x15 Resolute Onyx DES).   Cataract 02/04/2018   Cerebrovascular accident (CVA) (HCC) 03/18/2023   dysarthria   Chronic bronchitis (HCC)    secondary to cigarette smoking   FHx: allergies    Goiter    Granulomatous disease    Hernia    Kidney stone on left side 2013   NSTEMI (non-ST elevated myocardial infarction) (HCC) 03/23/2020   Oxygen  deficiency    Panic attacks    PVC's (premature ventricular contractions)    a. 03/2018 Zio: Occas PVCs (2.5%). Triggered events assoc w/ PVC/PAC.   Retinal detachment, left 03/04/2020   Retinal tear 2020   Stroke (HCC) 10/29/2016   mild left side weakness   Stroke (HCC) 08/01/2019   Tobacco abuse    Past Surgical History:  Procedure Laterality Date   ABDOMINAL HYSTERECTOMY     BREAST SURGERY     CATARACT EXTRACTION W/PHACO Right 03/01/2017   Procedure: CATARACT EXTRACTION PHACO AND INTRAOCULAR LENS PLACEMENT (IOC) RIGHT DIABETIC;  Surgeon: Mittie Gaskin, MD;   Location: [facility];  Service: Ophthalmology;  Laterality: Right;   CATARACT EXTRACTION W/PHACO Left 03/22/2017   Procedure: CATARACT EXTRACTION PHACO AND INTRAOCULAR LENS PLACEMENT (IOC) LEFT DIABETIC;  Surgeon: Mittie Gaskin, MD;  Location: [facility];  Service: Ophthalmology;  Laterality: Left;  Diabetic - insulin  and oral meds   COLONOSCOPY WITH PROPOFOL  N/A 09/23/2014   Procedure: COLONOSCOPY WITH PROPOFOL ;  Surgeon: Gladis RAYMOND Mariner, MD;  Location: [facility] ENDOSCOPY;  Service: Endoscopy;  Laterality: N/A;   COLONOSCOPY WITH PROPOFOL  N/A 01/11/2018   Procedure: COLONOSCOPY WITH PROPOFOL ;  Surgeon: Mariner Gladis RAYMOND, MD;  Location: [facility] ENDOSCOPY;  Service: Endoscopy;  Laterality: N/A;   COLONOSCOPY WITH PROPOFOL  N/A 04/24/2018   Procedure: COLONOSCOPY WITH PROPOFOL ;  Surgeon: Mariner Gladis RAYMOND, MD;  Location: [facility] ENDOSCOPY;  Service: Endoscopy;  Laterality: N/A;   COLONOSCOPY WITH PROPOFOL  N/A 11/19/2019   Procedure: COLONOSCOPY WITH PROPOFOL ;  Surgeon: Jinny Carmine, MD;  Location: [facility] ENDOSCOPY;  Service: Endoscopy;  Laterality: N/A;   CORONARY ANGIOPLASTY WITH STENT PLACEMENT  2008   CORONARY STENT INTERVENTION N/A 03/24/2020   Procedure: CORONARY STENT INTERVENTION;  Surgeon: Mady Bruckner, MD;  Location: [facility] INVASIVE CV LAB;  Service: Cardiovascular;  Laterality: N/A;   ESOPHAGOGASTRODUODENOSCOPY (EGD) WITH PROPOFOL  N/A 12/30/2014   Procedure: ESOPHAGOGASTRODUODENOSCOPY (EGD) WITH PROPOFOL ;  Surgeon: Gladis RAYMOND Mariner, MD;  Location: [facility] ENDOSCOPY;  Service:  Endoscopy;  Laterality: N/A;   ESOPHAGOGASTRODUODENOSCOPY (EGD) WITH PROPOFOL  N/A 07/19/2016   Procedure: ESOPHAGOGASTRODUODENOSCOPY (EGD) WITH PROPOFOL ;  Surgeon: Gaylyn Gladis PENNER, MD;  Location: [facility] ENDOSCOPY;  Service: Endoscopy;  Laterality: N/A;   ESOPHAGOGASTRODUODENOSCOPY (EGD) WITH PROPOFOL  N/A 04/24/2018   Procedure: ESOPHAGOGASTRODUODENOSCOPY (EGD) WITH PROPOFOL ;  Surgeon: Gaylyn Gladis PENNER, MD;  Location: [facility] ENDOSCOPY;  Service: Endoscopy;  Laterality: N/A;   EYE SURGERY  cataracts, detached retina   hysterectomy (other)     LEFT HEART CATH AND CORONARY ANGIOGRAPHY N/A 03/24/2020   Procedure: LEFT HEART CATH AND CORONARY ANGIOGRAPHY;  Surgeon: Mady Bruckner, MD;  Location: [facility] INVASIVE CV LAB;  Service: Cardiovascular;  Laterality: N/A;   Patient Active Problem List   Diagnosis Date Noted   Stroke (HCC) 08/05/2023   Depression with anxiety 08/05/2023   Overweight (BMI 25.0-29.9) 08/05/2023   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) 03/17/2023   Dizziness 02/23/2023   Restrictive airway disease 07/21/2020   Partial thickness rotator cuff tear 03/20/2020   Shoulder pain 03/05/2020   Ataxia 02/28/2020   Difficulty walking 02/28/2020   Physical deconditioning 02/28/2020   Chronic diastolic congestive heart failure (HCC) 11/20/2019   Polyp of ascending colon    Hypokalemia 08/08/2019   TIA (transient ischemic attack) 08/01/2019   Benign hypertensive kidney disease with chronic kidney disease 04/25/2019   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/24/2018   Chronic, continuous use of opioids 03/26/2018   History of adenomatous polyp of colon 01/22/2018   Diverticulosis of colon 01/22/2018   History of CVA (cerebrovascular accident) 11/08/2016   Weakness of left upper extremity 10/30/2016   AAA (abdominal aortic aneurysm) without rupture 08/12/2016   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   Stage 3b chronic kidney disease (HCC) 07/27/2015   Type 2 diabetes mellitus with diabetic chronic kidney disease (HCC) 10/28/2014   Solitary pulmonary nodule on lung CT 08/20/2014   Allergic rhinitis 07/28/2014   Acid reflux 07/28/2014   Chronic obstructive pulmonary disease (HCC) 07/28/2014   Granuloma annulare    Back pain 11/12/2013   Lumbar scoliosis 10/30/2013   Lumbar radiculopathy 10/30/2013   Lumbar canal stenosis 10/30/2013   Proteinuria  10/22/2013   Smokes with greater than 30 pack year history 03/21/2011   Edema 08/18/2010   Hyperlipidemia 03/25/2009   Coronary artery disease 03/25/2009   Primary hypertension 03/25/2009   ONSET DATE: 08/05/2023  REFERRING DIAG: M70.101 (ICD-10-CM) - Weakness of left upper extremity   THERAPY DIAG:  Muscle weakness (generalized)  Other lack of coordination  Rationale for Evaluation and Treatment: Rehabilitation  SUBJECTIVE:  SUBJECTIVE STATEMENT: Daughter reports that pt will begin a steroid after seeing the ENT to see if possible cause of dizziness is neuritis.  Pt accompanied by: family member: daughter, Harrie  PERTINENT HISTORY: Daughter present and provided hx.  Daughter reports pt has had 4 strokes, beginning in 2018, 2019, January of 2025 and the most recent in June of 2025.  Pt had been participating in [facility] since the most recent CVA, and is now eager to transition to outpatient services where PT/OT/SLP have been ordered. Per MEDICAL RECORD NUMBERPMH: of CVA, COPD, Dysarthria, Ataxia, TIA, T2DM, Back pain, scoliosis   PRECAUTIONS: Fall, Loop recorder for Afib  WEIGHT BEARING RESTRICTIONS: No  PAIN: 11/21/23: 3/10 chronic back pain 11/08/23: Same as below 11/02/23: same as eval below Are you having pain? Yes: NPRS scale: 3/10, can get up to 7-8/10 Pain location: low back and L hip Pain description: achy, sharp, dull Aggravating factors:  prolonged standing  Relieving factors: sitting, pain meds, heat/ice   FALLS: Has patient fallen in last 6 months? Yes. Number of falls 1, tripping over cords  LIVING ENVIRONMENT: Lives with: lives with their spouse Lives in: 1 level home Stairs: Yes: External: 1 steps; none Has following equipment at home: walk in shower, comfort height toilet   PLOF: Prior to Jan of this year, pt was managing all home management tasks, was able to drive, and was caring for spouse   PATIENT GOALS: Strengthening the left arm   OBJECTIVE:  Note:  Objective measures were completed at Evaluation unless otherwise noted.  HAND DOMINANCE: Right  ADLs: Overall ADLs: Daughter is pt's primary caregiver and lives near by Transfers/ambulation related to ADLs: occasional help with using walker at home, depending on balance on a given day Eating: indep/able to cut food  Grooming: bimanual grooming without difficulty UB Dressing: extra time for clothing fasteners, but able to gather clothing with RW LB Dressing: extra time with clothing fasteners Toileting: occasional help to walk to the bathroom, but modified indep within the bathroom for toileting tasks Bathing: direct supv for bathing using walkin in shower, shower chair, and grab bar; gets hair washed weekly at salon Tub Shower transfers: supv for walk in shower Equipment: RW, SBQC, shower chair, grab bars  IADLs: Shopping: daughter currently managing Light housekeeping: daughter currently managing; can start laundry on a good day.  Meal Prep: starting to participate in light meal prep, including making eggs on stove top Community mobility: daughter assists with all transportation; pt using transport chair to reach therapy gym today Medication management: daughter sets up pills 2 weeks at a time  Financial management: pt is participating in bill paying with daughter's assistance; most are online Handwriting: 100% legible  POSTURE COMMENTS:  rounded shoulders, forward head, increased thoracic kyphosis  ACTIVITY TOLERANCE: Activity tolerance: to be further assessed within functional contexts  FUNCTIONAL OUTCOME MEASURES: TBD  UPPER EXTREMITY ROM:  BUEs WFL for daily tasks  UPPER EXTREMITY MMT:  Hx of R rotator cuff tear 4-5 years ago with residual weakness, non-surgical;    MMT Right eval Left eval  Shoulder flexion 3+ 4  Shoulder abduction 3+ 4  Shoulder adduction    Shoulder extension    Shoulder internal rotation 3+ 4-  Shoulder external rotation 3+ 3+  Elbow flexion  5 5  Elbow extension 5 5  Wrist flexion 4+ 5  Wrist extension 4+ 5  Wrist ulnar deviation    Wrist radial deviation    Wrist pronation    Wrist supination    (Blank rows = not tested)   *Most recent CVA affecting L non-dominant side, with LUE presenting with increased strength as compared to dominant/unaffected arm.  Assume RUE weaker from hx of rotator cuff tear, or possibly previous CVAs.  Pt does present with mild LUE ataxia, and bilat hand weakness.  HAND FUNCTION: Grip strength: Right: 32 lbs; Left: 30 lbs, Lateral pinch: Right: 8 lbs, Left: 6 lbs, and 3 point pinch: Right: 7 lbs, Left: 6 lbs  COORDINATION: Finger Nose Finger test: LUE mild ataxia  9 Hole Peg test: Right: 29 sec; Left: 47 sec  SENSATION: Pt reports intermittent numbness in the R thumb, IF, and LF  EDEMA: No visible edema  MUSCLE TONE: RUE: Within functional limits and LUE: Within functional limits  COGNITION: Overall cognitive status: Impaired; see SLP eval for details  VISION: Pt reports L eye feels more blurry since first stroke in 2018; pt  has reading glasses but doesn't usually wear them   VISION ASSESSMENT: To be further assessed in functional context Tracking/Visual pursuits: Able to track stimulus in all quads without difficulty Saccades: WFL Visual Fields: no apparent deficits Clock drawing completed: all numbers spaced evenly, but pt left out the 1 and verbalized not knowing how to draw ten minutes to eleven when this direction was given.  Patient has difficulty with following activities due to following visual impairments: To be further assessed  PERCEPTION: To be further assessed  PRAXIS: Impaired: Motor planning  OBSERVATIONS: Pt pleasant, cooperative.  Daughter present and very supportive in pt's care.                                                                                                                       TREATMENT DATE: 11/21/23 Therapeutic Activtity: -Facilitated L  hand/bilat hand coordination skills in prep for donning/doffing earring studs.  Pt threaded small cubed beads onto wire string, progressing to threading earring back studs onto wire (simulating earring tip)  Self Care: -Review of MVPT results with pt and daughter, with education provided on real world/functional examples of specific visual processing deficits within each category.  -Phone navigation practice: calling and searching for contacts, setting phone alarm; recommendation for trial of stylus for improving accuracy when keying in numbers/letters on phone  PATIENT EDUCATION: Education details: MVPT results Person educated: Patient and Child(ren) Education method: Explanation and Verbal cues Education comprehension: verbalized understanding  HOME EXERCISE PROGRAM: Yellow theraputty  GOALS: Goals reviewed with patient? Yes  SHORT TERM GOALS: Target date: 12/11/23  Pt will perform HEP with min vc or less for improving LUE coordination and bilat hand strength. Baseline: Eval: HEP not yet initiated Goal status: INITIAL  2.  Pt will be indep to verbalize and implement 2-3 fall prevention measures to reduce fall risk with ADLs.  Baseline: Eval: Educ not yet initiated  Goal status: INITIAL  LONG TERM GOALS: Target date: 01/22/24  Pt will perform shower transfers with modified indep Baseline: Eval: Direct supv Goal status: INITIAL  2.  Pt will improve LUE GMC to enable reaching with good accuracy into overhead kitchen cabinets. Baseline: Eval: Mild LUE ataxia  Goal status: INITIAL  3.  Pt will improve L hand [facility] skills as demonstrated by 10 sec or more improvement in 9 hole peg test for improved efficiency when manipulating  clothing fasteners. Baseline: Eval: L 9 hole peg test: 47 sec, R 29 sec Goal status: INITIAL  4.  Pt will manage light loads of laundry with modified indep. Baseline: Eval: Daughter mostly manages, but pt can start laundry on a good day Goal status:  INITIAL  ASSESSMENT:  CLINICAL IMPRESSION: Pt and daughter were educated on functional examples of visual processing deficits in relation to pt's MVPT results.  OT explained difficulty to pin point whether pt's errors were made d/t processing deficits vs hx of blurred vision in the L eye d/t retinal detachment and repair in the past.  Pt  and daughter deny much difficulty with ADL/IADLs when given functional examples of visual processing deficits.  Daughter reports that pt continues to struggle with manipulating small ADL supplies/picking up small items.  Pt did well today working with small beads and threading earring backs onto wire.  Pt reports that her daughter is currently assisting her to remove and change out her earrings d/t coordination deficits.  OT encouraged pt make attempts at removing and placing earrings the next time she wants to change them out, with daughter present in case pt drops earrings or backs during her attempts, as pt did well to secure earring backs at table top level, but challenge is certainly increased when placing earrings into ears d/t less visual feedback available.  Pt/daughter receptive to recommendations.  Pt continues to benefit from skilled OT to address above noted deficits while working to maximize Pt's level of indep with ADL/IADL tasks in order to reduce burden of care on daughter and improve QOL.     PERFORMANCE DEFICITS: in functional skills including ADLs, IADLs, coordination, dexterity, sensation, ROM, strength, pain, Fine motor control, Gross motor control, mobility, balance, body mechanics, endurance, decreased knowledge of precautions, decreased knowledge of use of DME, vision, and UE functional use, cognitive skills including emotional, perception, problem solving, safety awareness, temperament/personality, and understand, and psychosocial skills including coping strategies, environmental adaptation, and routines and behaviors.   IMPAIRMENTS: are limiting  patient from ADLs, IADLs, leisure, and social participation.   CO-MORBIDITIES: has co-morbidities such as afib, AAA, hx of multiple CVAs that affects occupational performance. Patient will benefit from skilled OT to address above impairments and improve overall function.  MODIFICATION OR ASSISTANCE TO COMPLETE EVALUATION: Min-Moderate modification of tasks or assist with assess necessary to complete an evaluation.  OT OCCUPATIONAL PROFILE AND HISTORY: Detailed assessment: Review of records and additional review of physical, cognitive, psychosocial history related to current functional performance.  CLINICAL DECISION MAKING: Moderate - several treatment options, min-mod task modification necessary  REHAB POTENTIAL: Good  EVALUATION COMPLEXITY: Moderate    PLAN:  OT FREQUENCY: 2x/week  OT DURATION: 12 weeks  PLANNED INTERVENTIONS: 97168 OT Re-evaluation, 97535 self care/ADL training, 02889 therapeutic exercise, 97530 therapeutic activity, 97112 neuromuscular re-education, 97140 manual therapy, 97116 gait training, 02989 moist heat, 97010 cryotherapy, 97129 Cognitive training (first 15 min), 02249 Physical Performance Testing, balance training, functional mobility training, visual/perceptual remediation/compensation, psychosocial skills training, energy conservation, coping strategies training, patient/family education, and DME and/or AE instructions  RECOMMENDED OTHER SERVICES: none at this time (OT/PT/SLP services have all been ordered)  CONSULTED AND AGREED WITH PLAN OF CARE: Patient and family member/caregiver  PLAN FOR NEXT SESSION: see above  Inocente Blazing, MS, OTR/L  11/21/2023, 8:01 PM

## 2023-11-22 NOTE — Progress Notes (Signed)
 Remote Loop Recorder Transmission

## 2023-11-22 NOTE — Progress Notes (Signed)
 Established patient visit   Patient: Hannah Kirby   DOB: Jul 11, 1945   78 y.o. Female  MRN: [id number] Visit Date: 11/13/2023  Today's healthcare provider: [name] Perry, MD   Chief Complaint  Patient presents with   Follow-up    F/u . Dizziness for 1 wk  wants to discuss vertigo   Subjective    Discussed the use of AI scribe software for clinical note transcription with the patient, who gave verbal consent to proceed.  History of Present Illness   Hannah Kirby is a 78 year old female who presents with persistent dizziness.  She has been experiencing dizziness for the past two to three weeks, with a significant increase in frequency and severity since the Friday before last. The dizziness is described as a 'room spinning' sensation and occurs both when she is moving and when she is sitting still or lying down. It has been severe enough to cause her to cancel appointments and limit her daily activities.  She was taken to the hospital where an MRI was performed, but no new findings were noted. There was concern about a possible new stroke, but the hospital did not find any new findings on MRI. Her blood pressure has been noted to be elevated, which her daughter attributes to anxiety related to the dizziness.  There is a family history of similar symptoms, as her mother experienced the same type of dizziness. She has been using meclizine , which was prescribed during a previous hospital visit, but it provides limited relief, especially when the dizziness is severe. The prescription she has at home is likely 25 mg, although she is unsure of the exact dosage. She does not take it regularly as she dislikes taking pills.  During the review of symptoms, she reported experiencing blurry vision. No nausea or hearing difficulties.       Medications: Outpatient Medications Prior to Visit  Medication Sig Note   acetaminophen  (TYLENOL ) 500 MG tablet Take 500 mg by mouth every 6 (six)  hours as needed.    albuterol  (VENTOLIN  HFA) 108 (90 Base) MCG/ACT inhaler Inhale 2 puffs into the lungs every 4 (four) hours as needed for wheezing or shortness of breath.    aspirin  (ASPIR-81) 81 MG EC tablet Take 81 mg by mouth daily.    calcium  carbonate (TUMS - DOSED IN MG ELEMENTAL CALCIUM ) 500 MG chewable tablet Chew 1 tablet by mouth daily as needed.    clopidogrel  (PLAVIX ) 75 MG tablet TAKE ONE TABLET BY MOUTH EVERY DAY    cyanocobalamin  1000 MCG tablet Take 1,000 mcg by mouth daily.    dapagliflozin  propanediol (FARXIGA ) 5 MG TABS tablet Take 1 tablet (5 mg total) by mouth daily before breakfast. (Patient taking differently: Take 2.5 mg by mouth every morning.)    Docusate Sodium  (DSS) 100 MG CAPS Take 1 capsule by mouth daily.    ezetimibe  (ZETIA ) 10 MG tablet TAKE ONE TABLET BY MOUTH EVERY DAY    Fluticasone -Umeclidin-Vilant (TRELEGY ELLIPTA ) 100-62.5-25 MCG/ACT AEPB INHALE 1 PUFF BY MOUTH EVERY DAY - DISCARD DEVICE 6 WEEKS AFTER IT IS REMOVED FROM THE FOIL TRAY OR WHEN THE DOSE COUNTER READS &quot;0&quot; (WHICHEVER COMES FIRST)    insulin  glargine (LANTUS ) 100 UNIT/ML injection Inject 22 Units into the skin daily.    ipratropium (ATROVENT ) 0.06 % nasal spray Place 2 sprays into both nostrils 4 (four) times daily.    meclizine  (ANTIVERT ) 25 MG tablet Take 25 mg by mouth 3 (three) times daily  as needed.    metoprolol  tartrate (LOPRESSOR ) 100 MG tablet Take 1 tablet (100 mg total) by mouth 2 (two) times daily.    Multiple Vitamins-Minerals (DAILY MULTI) TABS Take 1 tablet by mouth daily.    nitroGLYCERIN  (NITROSTAT ) 0.4 MG SL tablet Place 1 tablet (0.4 mg total) under the tongue every 5 (five) minutes as needed.    Omega-3 Fatty Acids (FISH OIL ) 1000 MG CAPS Take 2 capsules by mouth in the morning and at bedtime.     ondansetron  (ZOFRAN  ODT) 4 MG disintegrating tablet Take 1 tablet (4 mg total) by mouth every 8 (eight) hours as needed for nausea or vomiting.     promethazine -dextromethorphan  (PROMETHAZINE -DM) 6.25-15 MG/5ML syrup Take 2.5 mLs by mouth at bedtime as needed.    RABEprazole  (ACIPHEX ) 20 MG tablet Take 1 tablet (20 mg total) by mouth daily. 15 Mins. before evening meal    rosuvastatin  (CRESTOR ) 40 MG tablet Take 1 tablet (40 mg total) by mouth daily.    Semaglutide, 1 MG/DOSE, (OZEMPIC, 1 MG/DOSE,) 4 MG/3ML SOPN Inject 1 mg into the skin once a week.    sertraline  (ZOLOFT ) 25 MG tablet Take 25 mg by mouth daily.    sucralfate  (CARAFATE ) 1 g tablet Take 1 tablet (1 g total) by mouth 2 (two) times daily. 15 Minutes before evening meal and at bed time    [DISCONTINUED] felodipine  (PLENDIL ) 5 MG 24 hr tablet Take 1 tablet (5 mg total) by mouth daily.    [DISCONTINUED] Finerenone  (KERENDIA ) 10 MG TABS Take 10 mg by mouth.    [DISCONTINUED] HYDROcodone -acetaminophen  (NORCO) 7.5-325 MG tablet Take 1 tablet by mouth every 6 (six) hours as needed for moderate pain (pain score 4-6).    [DISCONTINUED] lisinopril  (ZESTRIL ) 20 MG tablet TAKE ONE TABLET BY MOUTH EVERY DAY    [DISCONTINUED] meclizine  (ANTIVERT ) 12.5 MG tablet Take 1 tablet (12.5 mg total) by mouth 3 (three) times daily as needed. 11/13/2023: NEVER FILLED   No facility-administered medications prior to visit.   Review of Systems     Objective    BP 120/83 (BP Location: Right Arm, Patient Position: Sitting, Cuff Size: Normal)   Pulse 71   Resp 16   Ht 5' 5 (1.651 m)   Wt 160 lb (72.6 kg)   SpO2 100%   BMI 26.63 kg/m   Physical Exam  General Appearance:    Well developed, well nourished female, alert, cooperative, in no acute distress  HENT:   left TM normal without fluid or infection, neck without nodes. Right ear canal with excessive cerumen. throat normal without erythema or exudate  Eyes:    PERRL, conjunctiva/corneas clear, EOM's intact   No nystagmus  Lungs:     Clear to auscultation bilaterally, respirations unlabored  Heart:    Normal heart rate. Normal rhythm. No  murmurs, rubs, or gallops.    Neurologic:   Awake, alert, oriented x 3. No apparent focal neurological           defect.         Assessment & Plan        Vertigo Vertigo persisted for weeks with increased frequency and severity. MRI showed no new findings. Suspected inner ear issues. - Refer to ENT specialist for evaluation and management. - Resent meclizine  prescription that she never picked up after ER visit.last week.  - Follow up with neurologist in October.  Essential hypertension Blood pressure elevated, possibly due to anxiety related to vertigo. Current medications include felodipine  and lisinopril . -  Refill prescriptions for felodipine  and lisinopril  with 90-day supplies.         [name] Perry, MD  [facility] [phone] (phone) [phone] (fax)  [facility]

## 2023-11-23 ENCOUNTER — Ambulatory Visit: Admitting: Occupational Therapy

## 2023-11-23 ENCOUNTER — Ambulatory Visit

## 2023-11-23 ENCOUNTER — Ambulatory Visit: Attending: Family Medicine | Admitting: Physical Therapy

## 2023-11-23 ENCOUNTER — Encounter

## 2023-11-23 DIAGNOSIS — R269 Unspecified abnormalities of gait and mobility: Secondary | ICD-10-CM | POA: Diagnosis not present

## 2023-11-23 DIAGNOSIS — R41841 Cognitive communication deficit: Secondary | ICD-10-CM | POA: Diagnosis not present

## 2023-11-23 DIAGNOSIS — R278 Other lack of coordination: Secondary | ICD-10-CM | POA: Insufficient documentation

## 2023-11-23 DIAGNOSIS — R4701 Aphasia: Secondary | ICD-10-CM

## 2023-11-23 DIAGNOSIS — R482 Apraxia: Secondary | ICD-10-CM | POA: Insufficient documentation

## 2023-11-23 DIAGNOSIS — R42 Dizziness and giddiness: Secondary | ICD-10-CM | POA: Insufficient documentation

## 2023-11-23 DIAGNOSIS — R471 Dysarthria and anarthria: Secondary | ICD-10-CM | POA: Insufficient documentation

## 2023-11-23 DIAGNOSIS — R262 Difficulty in walking, not elsewhere classified: Secondary | ICD-10-CM | POA: Insufficient documentation

## 2023-11-23 DIAGNOSIS — R2689 Other abnormalities of gait and mobility: Secondary | ICD-10-CM | POA: Diagnosis not present

## 2023-11-23 DIAGNOSIS — M6281 Muscle weakness (generalized): Secondary | ICD-10-CM | POA: Diagnosis not present

## 2023-11-23 DIAGNOSIS — R2681 Unsteadiness on feet: Secondary | ICD-10-CM | POA: Insufficient documentation

## 2023-11-23 DIAGNOSIS — I69890 Apraxia following other cerebrovascular disease: Secondary | ICD-10-CM | POA: Diagnosis not present

## 2023-11-23 NOTE — Therapy (Signed)
 OUTPATIENT PHYSICAL THERAPY NEURO TREATMENT/ Physical Therapy Progress Note   Dates of reporting period  10/17/23   to   11/23/23    Patient Name: Hannah Kirby MRN: [id number] DOB:16-Aug-1945, 78 y.o., female Today's Date: 11/23/2023   PCP: Gasper [name] BRAVO, MD REFERRING PROVIDER: Gasper [name] BRAVO, MD  END OF SESSION:    PT End of Session - 11/23/23 1316     Visit Number 10    Number of Visits 25    Date for Recertification  01/09/24    Progress Note Due on Visit 10    PT Start Time 1317    PT Stop Time 1400    PT Time Calculation (min) 43 min    Equipment Utilized During Treatment Gait belt    Activity Tolerance Patient tolerated treatment well    Behavior During Therapy WFL for tasks assessed/performed           Past Medical History:  Diagnosis Date   Acute hemorrhagic colitis 08/08/2019   Anemia    C. difficile colitis 08/09/2019   CAD (coronary artery disease)    a. 02/2006 PCI: BMS x 2 to RCA, cath o/w without significant coronary disease; b. nuclear stress test 07/2014: No ischemia/infarct; c. 11/2017 MV: no isch/infarct, EF 55-65%; d. 03/2020 NSTEMI/PCI: LM nl, LAD min irregs, RI 25, small, LCX nl, RCA 30p/m ISR, 99d (3.0x15 Resolute Onyx DES).   Cataract 02/04/2018   Cerebrovascular accident (CVA) (HCC) 03/18/2023   dysarthria   Chronic bronchitis (HCC)    secondary to cigarette smoking   FHx: allergies    Goiter    Granulomatous disease    Hernia    Kidney stone on left side 2013   NSTEMI (non-ST elevated myocardial infarction) (HCC) 03/23/2020   Oxygen  deficiency    Panic attacks    PVC's (premature ventricular contractions)    a. 03/2018 Zio: Occas PVCs (2.5%). Triggered events assoc w/ PVC/PAC.   Retinal detachment, left 03/04/2020   Retinal tear 2020   Stroke (HCC) 10/29/2016   mild left side weakness   Stroke (HCC) 08/01/2019   Tobacco abuse    Past Surgical History:  Procedure Laterality Date   ABDOMINAL HYSTERECTOMY     BREAST SURGERY      CATARACT EXTRACTION W/PHACO Right 03/01/2017   Procedure: CATARACT EXTRACTION PHACO AND INTRAOCULAR LENS PLACEMENT (IOC) RIGHT DIABETIC;  Surgeon: Mittie Gaskin, MD;  Location: [facility];  Service: Ophthalmology;  Laterality: Right;   CATARACT EXTRACTION W/PHACO Left 03/22/2017   Procedure: CATARACT EXTRACTION PHACO AND INTRAOCULAR LENS PLACEMENT (IOC) LEFT DIABETIC;  Surgeon: Mittie Gaskin, MD;  Location: [facility];  Service: Ophthalmology;  Laterality: Left;  Diabetic - insulin  and oral meds   COLONOSCOPY WITH PROPOFOL  N/A 09/23/2014   Procedure: COLONOSCOPY WITH PROPOFOL ;  Surgeon: Gladis RAYMOND Mariner, MD;  Location: [facility];  Service: Endoscopy;  Laterality: N/A;   COLONOSCOPY WITH PROPOFOL  N/A 01/11/2018   Procedure: COLONOSCOPY WITH PROPOFOL ;  Surgeon: Mariner Gladis RAYMOND, MD;  Location: [facility];  Service: Endoscopy;  Laterality: N/A;   COLONOSCOPY WITH PROPOFOL  N/A 04/24/2018   Procedure: COLONOSCOPY WITH PROPOFOL ;  Surgeon: Mariner Gladis RAYMOND, MD;  Location: [facility]COPY;  Service: Endoscopy;  Laterality: N/A;   COLONOSCOPY WITH PROPOFOL  N/A 11/19/2019   Procedure: COLONOSCOPY WITH PROPOFOL ;  Surgeon: Jinny Carmine, MD;  Location: [facility] ENDOSCOPY;  Service: Endoscopy;  Laterality: N/A;   CORONARY ANGIOPLASTY WITH STENT PLACEMENT  2008   CORONARY STENT INTERVENTION N/A 03/24/2020   Procedure: CORONARY STENT INTERVENTION;  Surgeon: Mady Bruckner, MD;  Location: [facility] INVASIVE CV LAB;  Service: Cardiovascular;  Laterality: N/A;   ESOPHAGOGASTRODUODENOSCOPY (EGD) WITH PROPOFOL  N/A 12/30/2014   Procedure: ESOPHAGOGASTRODUODENOSCOPY (EGD) WITH PROPOFOL ;  Surgeon: Gladis RAYMOND Mariner, MD;  Location: [facility];  Service: Endoscopy;  Laterality: N/A;   ESOPHAGOGASTRODUODENOSCOPY (EGD) WITH PROPOFOL  N/A 07/19/2016   Procedure: ESOPHAGOGASTRODUODENOSCOPY (EGD) WITH PROPOFOL ;  Surgeon: Mariner Gladis RAYMOND, MD;  Location: [facility] ENDOSCOPY;  Service:  Endoscopy;  Laterality: N/A;   ESOPHAGOGASTRODUODENOSCOPY (EGD) WITH PROPOFOL  N/A 04/24/2018   Procedure: ESOPHAGOGASTRODUODENOSCOPY (EGD) WITH PROPOFOL ;  Surgeon: Mariner Gladis RAYMOND, MD;  Location: [facility] ENDOSCOPY;  Service: Endoscopy;  Laterality: N/A;   EYE SURGERY  cataracts, detached retina   hysterectomy (other)     LEFT HEART CATH AND CORONARY ANGIOGRAPHY N/A 03/24/2020   Procedure: LEFT HEART CATH AND CORONARY ANGIOGRAPHY;  Surgeon: Mady Bruckner, MD;  Location: [facility] INVASIVE CV LAB;  Service: Cardiovascular;  Laterality: N/A;   Patient Active Problem List   Diagnosis Date Noted   Stroke (HCC) 08/05/2023   Depression with anxiety 08/05/2023   Overweight (BMI 25.0-29.9) 08/05/2023   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) 03/17/2023   Dizziness 02/23/2023   Restrictive airway disease 07/21/2020   Partial thickness rotator cuff tear 03/20/2020   Shoulder pain 03/05/2020   Ataxia 02/28/2020   Difficulty walking 02/28/2020   Physical deconditioning 02/28/2020   Chronic diastolic congestive heart failure (HCC) 11/20/2019   Polyp of ascending colon    Hypokalemia 08/08/2019   TIA (transient ischemic attack) 08/01/2019   Benign hypertensive kidney disease with chronic kidney disease 04/25/2019   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/24/2018   Chronic, continuous use of opioids 03/26/2018   History of adenomatous polyp of colon 01/22/2018   Diverticulosis of colon 01/22/2018   History of CVA (cerebrovascular accident) 11/08/2016   Weakness of left upper extremity 10/30/2016   AAA (abdominal aortic aneurysm) without rupture 08/12/2016   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   Stage 3b chronic kidney disease (HCC) 07/27/2015   Type 2 diabetes mellitus with diabetic chronic kidney disease (HCC) 10/28/2014   Solitary pulmonary nodule on lung CT 08/20/2014   Allergic rhinitis 07/28/2014   Acid reflux 07/28/2014   Chronic obstructive pulmonary  disease (HCC) 07/28/2014   Granuloma annulare    Back pain 11/12/2013   Lumbar scoliosis 10/30/2013   Lumbar radiculopathy 10/30/2013   Lumbar canal stenosis 10/30/2013   Proteinuria 10/22/2013   Smokes with greater than 30 pack year history 03/21/2011   Edema 08/18/2010   Hyperlipidemia 03/25/2009   Coronary artery disease 03/25/2009   Primary hypertension 03/25/2009    ONSET DATE: 08/05/23  REFERRING DIAG:  Z86.73 (ICD-10-CM) - History of CVA (cerebrovascular accident)  R27.0 (ICD-10-CM) - Ataxia    THERAPY DIAG:    Muscle weakness (generalized)  Unsteadiness on feet  Dizziness and giddiness  Other lack of coordination  Abnormality of gait and mobility  Difficulty in walking, not elsewhere classified  Other abnormalities of gait and mobility  Rationale for Evaluation and Treatment: Rehabilitation  SUBJECTIVE:  SUBJECTIVE STATEMENT:  Pt describes dizziness as room spinning sensation; hasn't had any dizziness since last week. Pt reports that her dizziness doesn't happen every day, but it can make her feel off for the rest of the day. Pt states that she did try Meclizine  & felt that it may have helped some, but did not completely resolve her symptoms. [name] also noting that BP was elevated at time of dizziness onset.   Pt states that her dizziness has improved as, it feels like it has been less frequent.   Pt reports she hasn't started prednisone  yet, & she hasn't taken meclizine  this week.     Pt accompanied by: self and family member, daughter  PERTINENT HISTORY:  PMH: of CVA, COPD, Dysarthria, Ataxia, TIA, T2DM, Back pain, scoliosis   PAIN:  Are you having pain? Yes: NPRS scale: 3/10 Pain location: low back and L hip Pain description: achy Aggravating factors: just  hurts all the time. Relieving factors: hydrocodone  at night  PRECAUTIONS: Other: Pt has a looper to see if pt has a-fib  RED FLAGS: Abdominal aneurysm: Yes: pt reports having an abdominal aneurism as found by Dr. Marea.   WEIGHT BEARING RESTRICTIONS: No  FALLS: Has patient fallen in last 6 months? Yes. Number of falls 1.  Pt reports losing balance, and got caught up in cords that were on the floor when she was trying to close the blinds.  LIVING ENVIRONMENT: Lives with: lives with their spouse Lives in: House/apartment Stairs: Yes: External: 1 steps; none Has following equipment at home: Single point cane, Walker - 2 wheeled, Environmental consultant - 4 wheeled, shower chair, and Grab bars  PLOF: Independent  PATIENT GOALS: Pt is wanting to gain independence.  Pt is wanting to improve endurance levels and improving speech with SLP.  OBJECTIVE:  Note: Objective measures were completed at Evaluation unless otherwise noted.  DIAGNOSTIC FINDINGS:   EXAM: MRI HEAD WITHOUT CONTRAST   MRA HEAD WITHOUT CONTRAST   MRA NECK WITHOUT AND WITH CONTRAST   IMPRESSION: 1. Small acute infarcts in the left frontal lobe, right parietal lobe, and left cerebellum. 2. Extensive chronic small vessel ischemic disease. 3. 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 4. Unchanged severe right A3 stenosis or segmental occlusion. 5. Moderate left P2 stenosis. 6. Limited assessment of the left vertebral artery origin, otherwise negative neck MRA.   COGNITION: Overall cognitive status: Within functional limits for tasks assessed   SENSATION: WFL, however pt notes having some numbness in the R hand.  COORDINATION: WFL  POSTURE: rounded shoulders, forward head, decreased lumbar lordosis, increased thoracic kyphosis, posterior pelvic tilt, and flexed trunk    LOWER EXTREMITY MMT:    MMT Right Eval Left Eval  Hip flexion 4 4  Hip extension    Hip abduction 4 4-  Hip adduction 4 4-  Hip internal  rotation    Hip external rotation    Knee flexion 4 4-  Knee extension 4 4-  Ankle dorsiflexion 4 4-  Ankle plantarflexion    Ankle inversion    Ankle eversion    (Blank rows = not tested)  BED MOBILITY:  Not tested  FUNCTIONAL TESTS:  5 times sit to stand: 24.36 sec Timed up and go (TUG): 23.94 sec with walker 2 minute walk test: TBD 10 meter walk test: 20.33 sec with walker Berg Balance Scale: TBD  PATIENT SURVEYS:   Stroke Impact Scale 16 (Copyrighted instrument, [employer])  In the past  2 weeks, how difficult was it to...  Rating Scale 5 = Not difficult at all 4 = A little difficult 3 = Somewhat difficult 2 = Very difficult 1 = Could not do at all  a. Dress the top part of your body? 4  b. Bathe yourself? 2  c. Get to the toilet on time? 5  d. Control your bladder (not have an accident)? 5  e. Control your bowels (not have an accident)? 5  f. Stand without losing balance? 3  g. Go shopping? 2  h. Do heavy household chores (e.g. vacuum, laundry or  yard work)? 1  i. Stay sitting without losing your balance? 3  j. Walk without losing your balance? 2  k. Move from a bed to a chair? 3  l. Walk fast? 1  m. Climb one flight of stairs? 1  n. Walk one block? 1  o. Get in and out of a car? 2  p. Carry heavy objects (e.g. bag of groceries) with your  affected hand? 1  Sum:  41/80  MDC (Minimal Detectable Change) is >/=8                                                                                                                                 TREATMENT DATE: 11/23/23 Pt arrived to session in transport chair, daughter present.   BP: 133/80 [phone]) 73 bpm   Vestibular Assessment  OCULOMOTOR EXAM:   Ocular Alignment: normal  Ocular ROM: No Limitations  Spontaneous Nystagmus: absent  Gaze-Induced Nystagmus: absent  Smooth Pursuits: nystagmus noted when returning to center from both directions  Saccades: slow  Convergence/Divergence:  6 cm   Cover/Cross-Cover: WNL  VESTIBULAR - OCULAR REFLEX:   Slow VOR: Normal  VOR Cancellation: Unable to Maintain Gaze; difficulty remaining focused, not concordant dizziness  Head-Impulse Test: HIT Right: negative HIT Left: negative  POSITIONAL TESTING: not indicated this date.   Pt educated on findings of oculomotor exam, VOR testing. D/t pt reports of improved dizziness & positional testing being completed by ENT on 11/21/23, deferred positional testing this date.   Completed following physical performance measures: Five times Sit to Stand Test (FTSS) "Stand up and sit down as quickly as possible 5 times, keeping your arms folded across your chest."    TIME: 23.16 seconds with BUE support at arm rests  Times > 13.6 seconds is associated with increased disability and morbidity (Guralnik, 2000) Times > 15 seconds is predictive of recurrent falls in healthy individuals aged 77 and older (Buatois, et al., 2008) Normal performance values in community dwelling individuals aged 41 and older (Bohannon, 2006): 60-69 years: 11.4 seconds 70-79 years: 12.6 seconds 80-89 years: 14.8 seconds  MCID: >= 2.3 seconds for Vestibular Disorders (Meretta, 2006)'   Participated in Timed Up and Go (TUG): 1st trial: 27.00 seconds using RW  2nd trial: 25.00 seconds using RW  Average: 26 seconds using RW, CGA Patient demonstrates high fall  risk as indicated by requiring >13.5seconds to complete the TUG.    Pt completed following self-reported outcome measure:  Stroke Impact Scale 16 (Copyrighted instrument, [facility])  In the past 2 weeks, how difficult was it to...  Rating Scale 5 = Not difficult at all 4 = A little difficult 3 = Somewhat difficult 2 = Very difficult 1 = Could not do at all  a. Dress the top part of your body? 5  b. Bathe yourself? 3  c. Get to the toilet on time? 5  d. Control your bladder (not have an accident)? 5  e. Control your bowels (not  have an accident)? 5  f. Stand without losing balance? 2  g. Go shopping? 2  h. Do heavy household chores (e.g. vacuum, laundry or  yard work)? 2  i. Stay sitting without losing your balance? 2  j. Walk without losing your balance? 2  k. Move from a bed to a chair? 3  l. Walk fast? 1  m. Climb one flight of stairs? 1  n. Walk one block? 1  o. Get in and out of a car? 3  p. Carry heavy objects (e.g. bag of groceries) with your  affected hand? 2  Sum:  44/80  MDC (Minimal Detectable Change) is >/=8      PATIENT EDUCATION: Education details: Pt educated on role of PT and services provided during current POC, along with prognosis and information about the clinic.  Person educated: Patient and Child(ren) Education method: Explanation Education comprehension: verbalized understanding and returned demonstration  HOME EXERCISE PROGRAM:  Access Code: [email or web address] URL: [email or web address] Date: 10/17/2023 Prepared by: Sidra Simpers  Exercises - Standing March with Counter Support  - 1 x daily - 3-4 x weekly - 3 sets - 10 reps - Standing Knee Flexion with Unilateral Counter Support  - 1 x daily - 7 x weekly - 3 sets - 10 reps - Standing Hip Extension with Unilateral Counter Support  - 1 x daily - 7 x weekly - 3 sets - 10 reps - Standing Hip Abduction with Unilateral Counter Support  - 1 x daily - 7 x weekly - 3 sets - 10 reps - Heel Toe Raises with Unilateral Counter Support  - 1 x daily - 7 x weekly - 3 sets - 10 reps  GOALS: Goals reviewed with patient? Yes  SHORT TERM GOALS: Target date: 11/15/2023  Pt will be independent with HEP in order to demonstrate increased ability to perform tasks related to occupation/hobbies. Baseline: pt given HEP at eval Goal status: IN PROGRESS  LONG TERM GOALS: Target date: 01/10/2024  1.  Patient (> 44 years old) will complete five times sit to stand test in < 15 seconds indicating an increased LE strength and improved  balance. Baseline: 24.36 sec  11/23/23: 23.16 w BUE support at arm rests Goal status: IN PROGRESS  2.  Patient will improve SIS 16 score to 55   to demonstrate statistically significant improvement in mobility and quality of life as it relates to their CVA.  Baseline: 41 11/23/23: 44/80 Goal status: IN PROGRESS   3.  Patient will increase Berg Balance score by > 6 points to demonstrate decreased fall risk during functional activities. Baseline: 33/56  Goal status: INITIAL   4.  Patient will reduce timed up and go to <11 seconds to reduce fall risk and demonstrate improved transfer/gait ability. Baseline: 23.94 sec with walker 11/23/23: 26 sec with RW Goal status: IN PROGRESS  5.  Patient will increase 10 meter walk test to >1.76m/s as to improve gait speed for better community ambulation and to reduce fall risk. Baseline: 20.33 sec with walker  Goal status: INITIAL  6.  Patient will increase 2 minute walk test distance by 50 ft or greater for progression to community ambulator and demonstrate improved gait ability  Baseline: 275'; 83.82 m  Goal status: INITIAL     ASSESSMENT:  CLINICAL IMPRESSION:   Pt with some nystagmus noted with smooth pursuits when returning to center from both directions, slow saccades; pt denying symptoms throughout oculomotor exam. Pt reporting inability to maintain gaze & non-concordant dizziness with VOR cancellation. Negative HIT testing bilaterally. D/t improved dizziness symptoms, deferred positional testing. Remainder of session focused on reassessment of goals for progress note. Pt able to increase 5xSTS time by 1 sec., indicative of slight increase in functional strength. Pt also with increase in SIS by 3 points to 44/80 this date, indicative of increase in mobility & QOL. Pt without objective increase in TUG, but demonstrating improved gait mechanics & confidence in ambulation. Plan to assess BERG, , & next session. Patient's condition has  the potential to improve in response to therapy. Maximum improvement is yet to be obtained. The anticipated improvement is attainable and reasonable in a generally predictable time. The pt will benefit from further skilled PT to improve these deficits in order to increase QOL and ease/safety with ADLs.    OBJECTIVE IMPAIRMENTS: Abnormal gait, decreased activity tolerance, decreased balance, decreased endurance, decreased knowledge of use of DME, decreased mobility, difficulty walking, decreased strength, impaired sensation, and pain.   ACTIVITY LIMITATIONS: carrying, lifting, bending, standing, squatting, stairs, transfers, bathing, toileting, dressing, hygiene/grooming, and locomotion level  PARTICIPATION LIMITATIONS: meal prep, cleaning, laundry, community activity, and yard work  PERSONAL FACTORS: Age and 3+ comorbidities:  PMH: of CVA, COPD, Dysarthria, Ataxia, TIA, T2DM, Back pain, scoliosis are also affecting patient's functional outcome.   REHAB POTENTIAL: Good  CLINICAL DECISION MAKING: Evolving/moderate complexity  EVALUATION COMPLEXITY: Moderate  PLAN:  PT FREQUENCY: 2x/week  PT DURATION: 12 weeks  PLANNED INTERVENTIONS: 97750- Physical Performance Testing, 97110-Therapeutic exercises, 97530- Therapeutic activity, [id number]- Neuromuscular re-education, 97535- Self Care, [id number]- Manual therapy, [id number]- Gait training, [phone]- Canalith repositioning, Balance training, Stair training, and Vestibular training  PLAN FOR NEXT SESSION:  BERG, , & ; update goals accordingly.  Monitor SPO2 and get portable oxygen  if necessary  Endurance, balance, strength ( L>R), updated HEP, gait training  -Limiting seated rest breaks as tolerated to challenge endurance   Chiquita Silvan, SPT Physical Therapy Student - [facility]  [facility]  11/23/23, 2:11 PM

## 2023-11-23 NOTE — Therapy (Signed)
 OUTPATIENT OCCUPATIONAL THERAPY NEURO TREATMENT NOTE  Patient Name: Hannah Kirby MRN: [id number] DOB:1945-07-20, 78 y.o., female Today's Date: 11/23/2023  PCP: Dr. [name] Perry REFERRING PROVIDER: Dr. [name] Perry  END OF SESSION:  OT End of Session - 11/23/23 1808     Visit Number 6    Number of Visits 24    Date for Recertification  01/22/24    OT Start Time 1445    OT Stop Time 1530    OT Time Calculation (min) 45 min    Activity Tolerance Patient tolerated treatment well    Behavior During Therapy [facility] for tasks assessed/performed         Past Medical History:  Diagnosis Date   Acute hemorrhagic colitis 08/08/2019   Anemia    C. difficile colitis 08/09/2019   CAD (coronary artery disease)    a. 02/2006 PCI: BMS x 2 to RCA, cath o/w without significant coronary disease; b. nuclear stress test 07/2014: No ischemia/infarct; c. 11/2017 MV: no isch/infarct, EF 55-65%; d. 03/2020 NSTEMI/PCI: LM nl, LAD min irregs, RI 25, small, LCX nl, RCA 30p/m ISR, 99d (3.0x15 Resolute Onyx DES).   Cataract 02/04/2018   Cerebrovascular accident (CVA) (HCC) 03/18/2023   dysarthria   Chronic bronchitis (HCC)    secondary to cigarette smoking   FHx: allergies    Goiter    Granulomatous disease    Hernia    Kidney stone on left side 2013   NSTEMI (non-ST elevated myocardial infarction) (HCC) 03/23/2020   Oxygen  deficiency    Panic attacks    PVC's (premature ventricular contractions)    a. 03/2018 Zio: Occas PVCs (2.5%). Triggered events assoc w/ PVC/PAC.   Retinal detachment, left 03/04/2020   Retinal tear 2020   Stroke (HCC) 10/29/2016   mild left side weakness   Stroke (HCC) 08/01/2019   Tobacco abuse    Past Surgical History:  Procedure Laterality Date   ABDOMINAL HYSTERECTOMY     BREAST SURGERY     CATARACT EXTRACTION W/PHACO Right 03/01/2017   Procedure: CATARACT EXTRACTION PHACO AND INTRAOCULAR LENS PLACEMENT (IOC) RIGHT DIABETIC;  Surgeon: Mittie Gaskin, MD;   Location: [facility];  Service: Ophthalmology;  Laterality: Right;   CATARACT EXTRACTION W/PHACO Left 03/22/2017   Procedure: CATARACT EXTRACTION PHACO AND INTRAOCULAR LENS PLACEMENT (IOC) LEFT DIABETIC;  Surgeon: Mittie Gaskin, MD;  Location: [facility];  Service: Ophthalmology;  Laterality: Left;  Diabetic - insulin  and oral meds   COLONOSCOPY WITH PROPOFOL  N/A 09/23/2014   Procedure: COLONOSCOPY WITH PROPOFOL ;  Surgeon: Gladis RAYMOND Mariner, MD;  Location: [facility] END[facility]COPY;  Service: Endoscopy;  Laterality: N/A;   COLONOSCOPY WITH PROPOFOL  N/A 01/11/2018   Procedure: COLONOSCOPY WITH PROPOFOL ;  Surgeon: Mariner Gladis RAYMOND, MD;  Location: [facility] ENDOSCOPY;  Service: Endoscopy;  Laterality: N/A;   COLONOSCOPY WITH PROPOFOL  N/A 04/24/2018   Procedure: COLONOSCOPY WITH PROPOFOL ;  Surgeon: Mariner Gladis RAYMOND, MD;  Location: [facility] ENDOSCOPY;  Service: Endoscopy;  Laterality: N/A;   COLONOSCOPY WITH PROPOFOL  N/A 11/19/2019   Procedure: COLONOSCOPY WITH PROPOFOL ;  Surgeon: Jinny Carmine, MD;  Location: [facility] ENDOSCOPY;  Service: Endoscopy;  Laterality: N/A;   CORONARY ANGIOPLASTY WITH STENT PLACEMENT  2008   CORONARY STENT INTERVENTION N/A 03/24/2020   Procedure: CORONARY STENT INTERVENTION;  Surgeon: Mady Bruckner, MD;  Location: [facility] INVASIVE CV LAB;  Service: Cardiovascular;  Laterality: N/A;   ESOPHAGOGASTRODUODENOSCOPY (EGD) WITH PROPOFOL  N/A 12/30/2014   Procedure: ESOPHAGOGASTRODUODENOSCOPY (EGD) WITH PROPOFOL ;  Surgeon: Gladis RAYMOND Mariner, MD;  Location: [facility] ENDOSCOPY;  Service:  Endoscopy;  Laterality: N/A;   ESOPHAGOGASTRODUODENOSCOPY (EGD) WITH PROPOFOL  N/A 07/19/2016   Procedure: ESOPHAGOGASTRODUODENOSCOPY (EGD) WITH PROPOFOL ;  Surgeon: Gaylyn Gladis PENNER, MD;  Location: [facility] ENDOSCOPY;  Service: Endoscopy;  Laterality: N/A;   ESOPHAGOGASTRODUODENOSCOPY (EGD) WITH PROPOFOL  N/A 04/24/2018   Procedure: ESOPHAGOGASTRODUODENOSCOPY (EGD) WITH PROPOFOL ;  Surgeon: Gaylyn Gladis PENNER, MD;  Location: [facility] ENDOSCOPY;  Service: Endoscopy;  Laterality: N/A;   EYE SURGERY  cataracts, detached retina   hysterectomy (other)     LEFT HEART CATH AND CORONARY ANGIOGRAPHY N/A 03/24/2020   Procedure: LEFT HEART CATH AND CORONARY ANGIOGRAPHY;  Surgeon: Mady Bruckner, MD;  Location: [facility] CV [facility];  Service: Cardiovascular;  Laterality: N/A;   Patient Active Problem List   Diagnosis Date Noted   Stroke (HCC) 08/05/2023   Depression with anxiety 08/05/2023   Overweight (BMI 25.0-29.9) 08/05/2023   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) 03/17/2023   Dizziness 02/23/2023   Restrictive airway disease 07/21/2020   Partial thickness rotator cuff tear 03/20/2020   Shoulder pain 03/05/2020   Ataxia 02/28/2020   Difficulty walking 02/28/2020   Physical deconditioning 02/28/2020   Chronic diastolic congestive heart failure (HCC) 11/20/2019   Polyp of ascending colon    Hypokalemia 08/08/2019   TIA (transient ischemic attack) 08/01/2019   Benign hypertensive kidney disease with chronic kidney disease 04/25/2019   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/24/2018   Chronic, continuous use of opioids 03/26/2018   History of adenomatous polyp of colon 01/22/2018   Diverticulosis of colon 01/22/2018   History of CVA (cerebrovascular accident) 11/08/2016   Weakness of left upper extremity 10/30/2016   AAA (abdominal aortic aneurysm) without rupture 08/12/2016   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   Stage 3b chronic kidney disease (HCC) 07/27/2015   Type 2 diabetes mellitus with diabetic chronic kidney disease (HCC) 10/28/2014   Solitary pulmonary nodule on lung CT 08/20/2014   Allergic rhinitis 07/28/2014   Acid reflux 07/28/2014   Chronic obstructive pulmonary disease (HCC) 07/28/2014   Granuloma annulare    Back pain 11/12/2013   Lumbar scoliosis 10/30/2013   Lumbar radiculopathy 10/30/2013   Lumbar canal stenosis 10/30/2013   Proteinuria  10/22/2013   Smokes with greater than 30 pack year history 03/21/2011   Edema 08/18/2010   Hyperlipidemia 03/25/2009   Coronary artery disease 03/25/2009   Primary hypertension 03/25/2009   ONSET DATE: 08/05/2023  REFERRING DIAG: M70.101 (ICD-10-CM) - Weakness of left upper extremity   THERAPY DIAG:  Muscle weakness (generalized)  Other lack of coordination  Rationale for Evaluation and Treatment: Rehabilitation  SUBJECTIVE:  SUBJECTIVE STATEMENT: Pt. Reports that she is feeling better, and is not sure yet if she will go on a steroid Pt accompanied by: family member: daughter, Harrie  PERTINENT HISTORY: Daughter present and provided hx.  Daughter reports pt has had 4 strokes, beginning in 2018, 2019, January of 2025 and the most recent in June of 2025.  Pt had been participating in [facility] since the most recent CVA, and is now eager to transition to outpatient services where PT/OT/SLP have been ordered. Per MEDICAL RECORD NUMBERPMH: of CVA, COPD, Dysarthria, Ataxia, TIA, T2DM, Back pain, scoliosis   PRECAUTIONS: Fall, Loop recorder for Afib  WEIGHT BEARING RESTRICTIONS: No  PAIN: 11/21/23: 3/10 chronic back pain 11/08/23: Same as below 11/02/23: same as eval below Are you having pain? Yes: NPRS scale: 3/10, can get up to 7-8/10 Pain location: low back and L hip Pain description: achy, sharp, dull Aggravating factors: prolonged standing  Relieving factors: sitting, pain meds, heat/ice   FALLS: Has patient fallen in last 6 months? Yes. Number of falls 1, tripping over cords  LIVING ENVIRONMENT: Lives with: lives with their spouse Lives in: 1 level home Stairs: Yes: External: 1 steps; none Has following equipment at home: walk in shower, comfort height toilet   PLOF: Prior to Jan of this year, pt was managing all home management tasks, was able to drive, and was caring for spouse   PATIENT GOALS: Strengthening the left arm   OBJECTIVE:  Note: Objective measures were completed at  Evaluation unless otherwise noted.  HAND DOMINANCE: Right  ADLs: Overall ADLs: Daughter is pt's primary caregiver and lives near by Transfers/ambulation related to ADLs: occasional help with using walker at home, depending on balance on a given day Eating: indep/able to cut food  Grooming: bimanual grooming without difficulty UB Dressing: extra time for clothing fasteners, but able to gather clothing with RW LB Dressing: extra time with clothing fasteners Toileting: occasional help to walk to the bathroom, but modified indep within the bathroom for toileting tasks Bathing: direct supv for bathing using walkin in shower, shower chair, and grab bar; gets hair washed weekly at salon Tub Shower transfers: supv for walk in shower Equipment: RW, SBQC, shower chair, grab bars  IADLs: Shopping: daughter currently managing Light housekeeping: daughter currently managing; can start laundry on a good day.  Meal Prep: starting to participate in light meal prep, including making eggs on stove top Community mobility: daughter assists with all transportation; pt using transport chair to reach therapy gym today Medication management: daughter sets up pills 2 weeks at a time  Financial management: pt is participating in bill paying with daughter's assistance; most are online Handwriting: 100% legible  POSTURE COMMENTS:  rounded shoulders, forward head, increased thoracic kyphosis  ACTIVITY TOLERANCE: Activity tolerance: to be further assessed within functional contexts  FUNCTIONAL OUTCOME MEASURES: TBD  UPPER EXTREMITY ROM:  BUEs WFL for daily tasks  UPPER EXTREMITY MMT:  Hx of R rotator cuff tear 4-5 years ago with residual weakness, non-surgical;    MMT Right eval Left eval  Shoulder flexion 3+ 4  Shoulder abduction 3+ 4  Shoulder adduction    Shoulder extension    Shoulder internal rotation 3+ 4-  Shoulder external rotation 3+ 3+  Elbow flexion 5 5  Elbow extension 5 5  Wrist  flexion 4+ 5  Wrist extension 4+ 5  Wrist ulnar deviation    Wrist radial deviation    Wrist pronation    Wrist supination    (Blank rows = not tested)   *Most recent CVA affecting L non-dominant side, with LUE presenting with increased strength as compared to dominant/unaffected arm.  Assume RUE weaker from hx of rotator cuff tear, or possibly previous CVAs.  Pt does present with mild LUE ataxia, and bilat hand weakness.  HAND FUNCTION: Grip strength: Right: 32 lbs; Left: 30 lbs, Lateral pinch: Right: 8 lbs, Left: 6 lbs, and 3 point pinch: Right: 7 lbs, Left: 6 lbs  COORDINATION: Finger Nose Finger test: LUE mild ataxia  9 Hole Peg test: Right: 29 sec; Left: 47 sec  SENSATION: Pt reports intermittent numbness in the R thumb, IF, and LF  EDEMA: No visible edema  MUSCLE TONE: RUE: Within functional limits and LUE: Within functional limits  COGNITION: Overall cognitive status: Impaired; see SLP eval for details  VISION: Pt reports L eye feels more blurry since first stroke in 2018; pt has reading glasses  but doesn't usually wear them   VISION ASSESSMENT: To be further assessed in functional context Tracking/Visual pursuits: Able to track stimulus in all quads without difficulty Saccades: WFL Visual Fields: no apparent deficits Clock drawing completed: all numbers spaced evenly, but pt left out the 1 and verbalized not knowing how to draw ten minutes to eleven when this direction was given.  Patient has difficulty with following activities due to following visual impairments: To be further assessed  PERCEPTION: To be further assessed  PRAXIS: Impaired: Motor planning  OBSERVATIONS: Pt pleasant, cooperative.  Daughter present and very supportive in pt's care.                                                                                                                       TREATMENT DATE:11/23/23  Therapeutic Activtity:  -Facilitated left Lateral, and 3pt. Pinch  strengthening using yellow, red, green, and blue, and black level resistive clips in combination with hand function skills, and functional reaching. -Facilitated  translatory movements moving clips from the lateral pinch position to the 3pt. Pinch position in preparation for securely placing them on the dowel to promote hand function skills. Incorporated a reaching component through multiple planes in combination with the task to further challenge distal motor control while moving the proximal UE.    Neuromuscular re-education:   -Facilitated Left FMC skills using the Jamar Tweezer Dexterity Task using resistive tweezers to pick up 1 thin sticks from a horizontal position, and sustaining the grasp while preparing to place them into a vertical position with the pegboard placed at a flat tabletop surface. -Pt. Worked on grasping the pegs from the board at an elevated vertical angle, and storing them 1 thin pegs in her hand when removing them. -Facilitated left hand function skills working on translatory movements moving the pegs through her hand from the palm to the tip of the 2nd digit,and thumb.  Pt. Worked on controlling releasing them from the ulnar aspect of her hand   PATIENT EDUCATION: Education details: MVPT results Person educated: Patient and Child(ren) Education method: Explanation and Verbal cues Education comprehension: verbalized understanding  HOME EXERCISE PROGRAM: Yellow theraputty  GOALS: Goals reviewed with patient? Yes  SHORT TERM GOALS: Target date: 12/11/23  Pt will perform HEP with min vc or less for improving LUE coordination and bilat hand strength. Baseline: Eval: HEP not yet initiated Goal status: INITIAL  2.  Pt will be indep to verbalize and implement 2-3 fall prevention measures to reduce fall risk with ADLs.  Baseline: Eval: Educ not yet initiated  Goal status: INITIAL  LONG TERM GOALS: Target date: 01/22/24  Pt will perform shower transfers with  modified indep Baseline: Eval: Direct supv Goal status: INITIAL  2.  Pt will improve LUE GMC to enable reaching with good accuracy into overhead kitchen cabinets. Baseline: Eval: Mild LUE ataxia  Goal status: INITIAL  3.  Pt will improve L hand [facility] skills as demonstrated by 10 sec or more improvement in 9  hole peg test for improved efficiency when manipulating  clothing fasteners. Baseline: Eval: L 9 hole peg test: 47 sec, R 29 sec Goal status: INITIAL  4.  Pt will manage light loads of laundry with modified indep. Baseline: Eval: Daughter mostly manages, but pt can start laundry on a good day Goal status: INITIAL  ASSESSMENT:  CLINICAL IMPRESSION:  Pt. requires cues to engage her left hand during then tasks, rather than her right hand. Pt. requires consistent cuing for hand position for lateral, and 3pt. Pinch. Pt. required increased time to complete the tweezer task, and cues to modulate the amount of pressure required on the tweezers to avoid dropping the pegs om the tweezers when moving her left hand through different positions. Pt continues to benefit from skilled OT to address above noted deficits while working to maximize Pt's level of indep with ADL/IADL tasks in order to reduce burden of care on daughter and improve QOL.     PERFORMANCE DEFICITS: in functional skills including ADLs, IADLs, coordination, dexterity, sensation, ROM, strength, pain, Fine motor control, Gross motor control, mobility, balance, body mechanics, endurance, decreased knowledge of precautions, decreased knowledge of use of DME, vision, and UE functional use, cognitive skills including emotional, perception, problem solving, safety awareness, temperament/personality, and understand, and psychosocial skills including coping strategies, environmental adaptation, and routines and behaviors.   IMPAIRMENTS: are limiting patient from ADLs, IADLs, leisure, and social participation.   CO-MORBIDITIES: has  co-morbidities such as afib, AAA, hx of multiple CVAs that affects occupational performance. Patient will benefit from skilled OT to address above impairments and improve overall function.  MODIFICATION OR ASSISTANCE TO COMPLETE EVALUATION: Min-Moderate modification of tasks or assist with assess necessary to complete an evaluation.  OT OCCUPATIONAL PROFILE AND HISTORY: Detailed assessment: Review of records and additional review of physical, cognitive, psychosocial history related to current functional performance.  CLINICAL DECISION MAKING: Moderate - several treatment options, min-mod task modification necessary  REHAB POTENTIAL: Good  EVALUATION COMPLEXITY: Moderate    PLAN:  OT FREQUENCY: 2x/week  OT DURATION: 12 weeks  PLANNED INTERVENTIONS: 97168 OT Re-evaluation, 97535 self care/ADL training, 02889 therapeutic exercise, 97530 therapeutic activity, 97112 neuromuscular re-education, 97140 manual therapy, 97116 gait training, 02989 moist heat, 97010 cryotherapy, 97129 Cognitive training (first 15 min), 02249 Physical Performance Testing, balance training, functional mobility training, visual/perceptual remediation/compensation, psychosocial skills training, energy conservation, coping strategies training, patient/family education, and DME and/or AE instructions  RECOMMENDED OTHER SERVICES: none at this time (OT/PT/SLP services have all been ordered)  CONSULTED AND AGREED WITH PLAN OF CARE: Patient and family member/caregiver  PLAN FOR NEXT SESSION: see above  Richardson Otter, MS, OTR/L   11/23/2023, 6:09 PM

## 2023-11-23 NOTE — Progress Notes (Signed)
 Remote Loop Recorder Transmission

## 2023-11-23 NOTE — Therapy (Signed)
 OUTPATIENT SPEECH LANGUAGE PATHOLOGY APHASIA TREATMENT / PROGRESS NOTE   Patient Name: Hannah Kirby MRN: [id number] DOB:[birth year], 78 y.o., female Today's Date: 11/23/2023  PCP: [name] Perry, MD REFERRING PROVIDER: [name] Perry, MD  Speech Therapy Progress Note  Dates of Reporting Period: 10/17/23 to 11/23/23  Objective: Patient has been seen for 10 speech therapy sessions this reporting period targeting aphasia and apraxia of speech. Patient is making progress toward LTGs and met 3/5 STGs this reporting period. See skilled intervention, clinical impressions, and goals below for details.    End of Session - 11/23/23 1357     Visit Number 10    Number of Visits 23    Date for Recertification  01/09/24    Progress Note Due on Visit 20    SLP Start Time 1400    SLP Stop Time  1445    SLP Time Calculation (min) 45 min    Activity Tolerance Patient tolerated treatment well          Past Medical History:  Diagnosis Date   Acute hemorrhagic colitis 08/08/2019   Anemia    C. difficile colitis 08/09/2019   CAD (coronary artery disease)    a. 02/2006 PCI: BMS x 2 to RCA, cath o/w without significant coronary disease; b. nuclear stress test 07/2014: No ischemia/infarct; c. 11/2017 MV: no isch/infarct, EF 55-65%; d. 03/2020 NSTEMI/PCI: LM nl, LAD min irregs, RI 25, small, LCX nl, RCA 30p/m ISR, 99d (3.0x15 Resolute Onyx DES).   Cataract 02/04/2018   Cerebrovascular accident (CVA) (HCC) 03/18/2023   dysarthria   Chronic bronchitis (HCC)    secondary to cigarette smoking   FHx: allergies    Goiter    Granulomatous disease    Hernia    Kidney stone on left side 2013   NSTEMI (non-ST elevated myocardial infarction) (HCC) 03/23/2020   Oxygen  deficiency    Panic attacks    PVC's (premature ventricular contractions)    a. 03/2018 Zio: Occas PVCs (2.5%). Triggered events assoc w/ PVC/PAC.   Retinal detachment, left 03/04/2020   Retinal tear 2020   Stroke (HCC) 10/29/2016   mild  left side weakness   Stroke (HCC) 08/01/2019   Tobacco abuse    Past Surgical History:  Procedure Laterality Date   ABDOMINAL HYSTERECTOMY     BREAST SURGERY     CATARACT EXTRACTION W/PHACO Right 03/01/2017   Procedure: CATARACT EXTRACTION PHACO AND INTRAOCULAR LENS PLACEMENT (IOC) RIGHT DIABETIC;  Surgeon: Mittie Gaskin, MD;  Location: [facility];  Service: Ophthalmology;  Laterality: Right;   CATARACT EXTRACTION W/PHACO Left 03/22/2017   Procedure: CATARACT EXTRACTION PHACO AND INTRAOCULAR LENS PLACEMENT (IOC) LEFT DIABETIC;  Surgeon: Mittie Gaskin, MD;  Location: [facility];  Service: Ophthalmology;  Laterality: Left;  Diabetic - insulin  and oral meds   COLONOSCOPY WITH PROPOFOL  N/A 09/23/2014   Procedure: COLONOSCOPY WITH PROPOFOL ;  Surgeon: Gladis RAYMOND Mariner, MD;  Location: [facility] ENDOSCOPY;  Service: Endoscopy;  Laterality: N/A;   COLONOSCOPY WITH PROPOFOL  N/A 01/11/2018   Procedure: COLONOSCOPY WITH PROPOFOL ;  Surgeon: Mariner Gladis RAYMOND, MD;  Location: [facility] ENDOSCOPY;  Service: Endoscopy;  Laterality: N/A;   COLONOSCOPY WITH PROPOFOL  N/A 04/24/2018   Procedure: COLONOSCOPY WITH PROPOFOL ;  Surgeon: Mariner Gladis RAYMOND, MD;  Location: [facility] ENDOSCOPY;  Service: Endoscopy;  Laterality: N/A;   COLONOSCOPY WITH PROPOFOL  N/A 11/19/2019   Procedure: COLONOSCOPY WITH PROPOFOL ;  Surgeon: Jinny Carmine, MD;  Location: [facility] ENDOSCOPY;  Service: Endoscopy;  Laterality: N/A;   CORONARY ANGIOPLASTY WITH STENT PLACEMENT  2008   CORONARY STENT INTERVENTION N/A 03/24/2020   Procedure: CORONARY STENT INTERVENTION;  Surgeon: Mady Bruckner, MD;  Location: [facility] INVASIVE CV LAB;  Service: Cardiovascular;  Laterality: N/A;   ESOPHAGOGASTRODUODENOSCOPY (EGD) WITH PROPOFOL  N/A 12/30/2014   Procedure: ESOPHAGOGASTRODUODENOSCOPY (EGD) WITH PROPOFOL ;  Surgeon: Gladis RAYMOND Mariner, MD;  Location: [facility] ENDOSCOPY;  Service: Endoscopy;  Laterality: N/A;   ESOPHAGOGASTRODUODENOSCOPY (EGD)  WITH PROPOFOL  N/A 07/19/2016   Procedure: ESOPHAGOGASTRODUODENOSCOPY (EGD) WITH PROPOFOL ;  Surgeon: Mariner Gladis RAYMOND, MD;  Location: [facility] ENDOSCOPY;  Service: Endoscopy;  Laterality: N/A;   ESOPHAGOGASTRODUODENOSCOPY (EGD) WITH PROPOFOL  N/A 04/24/2018   Procedure: ESOPHAGOGASTRODUODENOSCOPY (EGD) WITH PROPOFOL ;  Surgeon: Mariner Gladis RAYMOND, MD;  Location: [facility] ENDOSCOPY;  Service: Endoscopy;  Laterality: N/A;   EYE SURGERY  cataracts, detached retina   hysterectomy (other)     LEFT HEART CATH AND CORONARY ANGIOGRAPHY N/A 03/24/2020   Procedure: LEFT HEART CATH AND CORONARY ANGIOGRAPHY;  Surgeon: Mady Bruckner, MD;  Location: [facility] INVASIVE CV LAB;  Service: Cardiovascular;  Laterality: N/A;   Patient Active Problem List   Diagnosis Date Noted   Stroke (HCC) 08/05/2023   Depression with anxiety 08/05/2023   Overweight (BMI 25.0-29.9) 08/05/2023   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) 03/17/2023   Dizziness 02/23/2023   Restrictive airway disease 07/21/2020   Partial thickness rotator cuff tear 03/20/2020   Shoulder pain 03/05/2020   Ataxia 02/28/2020   Difficulty walking 02/28/2020   Physical deconditioning 02/28/2020   Chronic diastolic congestive heart failure (HCC) 11/20/2019   Polyp of ascending colon    Hypokalemia 08/08/2019   TIA (transient ischemic attack) 08/01/2019   Benign hypertensive kidney disease with chronic kidney disease 04/25/2019   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/24/2018   Chronic, continuous use of opioids 03/26/2018   History of adenomatous polyp of colon 01/22/2018   Diverticulosis of colon 01/22/2018   History of CVA (cerebrovascular accident) 11/08/2016   Weakness of left upper extremity 10/30/2016   AAA (abdominal aortic aneurysm) without rupture 08/12/2016   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   Stage 3b chronic kidney disease (HCC) 07/27/2015   Type 2 diabetes mellitus with diabetic chronic kidney disease  (HCC) 10/28/2014   Solitary pulmonary nodule on lung CT 08/20/2014   Allergic rhinitis 07/28/2014   Acid reflux 07/28/2014   Chronic obstructive pulmonary disease (HCC) 07/28/2014   Granuloma annulare    Back pain 11/12/2013   Lumbar scoliosis 10/30/2013   Lumbar radiculopathy 10/30/2013   Lumbar canal stenosis 10/30/2013   Proteinuria 10/22/2013   Smokes with greater than 30 pack year history 03/21/2011   Edema 08/18/2010   Hyperlipidemia 03/25/2009   Coronary artery disease 03/25/2009   Primary hypertension 03/25/2009    ONSET DATE: 03/17/23 (admitted with stroke), 05/23/23 (referral date)  REFERRING DIAG:  R41.841 (ICD-10-CM) - Cognitive communication deficit  I63.9 (ICD-10-CM) - CVA (cerebral vascular accident) (HCC)  R47.02 (ICD-10-CM) - Dysphasia    THERAPY DIAG:  Aphasia  Apraxia of speech  Rationale for Evaluation and Treatment Rehabilitation  SUBJECTIVE:   SUBJECTIVE STATEMENT: Pt alert, pleasant, and cooperative.  Pt accompanied by: self and family member  PERTINENT HISTORY:  Pt is a 78 y.o. female who presents for communication evaluation in setting on stroke. Pt in ED 1/24-1/25/25. Pt d/c'd home with HH ST. PMHx significant of   HTN, CKD stage IIIB, DMT2 on insulin , CAD, chronic smoking, chronic respiratory failure with COPD and on home oxygen , anxiety, HLD. MRI 03/17/23 1. Punctate foci of restricted diffusion in the left  posterior  frontal white matter, most likely acute infarcts.  2. Numerous foci of hemosiderin deposition, which are seen in the  deep gray structures but also in the bilateral cerebral hemispheres,  with 1 new focus compared to 02/20/2023. While this could be the  sequela of chronic hypertensive microhemorrhages, this appearance is  concerning for cerebral amyloid angiopathy.   PAIN:  Are you having pain? No  FALLS: defer to PT  LIVING ENVIRONMENT: Lives with: lives with their spouse Lives in: House/apartment  PLOF:  Level of  assistance: Independent with ADLs Employment: Retired   PATIENT GOALS    for communication to improve   OBJECTIVE:  TODAY'S TREATMENT:  Pt seen for skilled SLP services targeting functional receptive and expressive communication.   Loaner SGD has since been returned. Pt brought permanent device to session.   Pt utilized SFA with min/mod A during structured tasks - providing 3 details about 8/10 objects. Pt generated sentences on > or = 3 words with 60% accuracy with mod cueing. Pt repeated 4 word phrases/sentences with 80% accuracy; improving to 100% with min/mod cues. Pt followed 2-step directives with 80% accuracy improving to 100% with verbal cues and SLP modeling.      PATIENT EDUCATION: Education details: as above Person educated: Patient and Child Education method: Explanation Education comprehension: verbalized understanding and needs further education  HOME EXERCISE PROGRAM:    SGD home practice worksheet    GOALS:  Goals reviewed with patient? Yes  SHORT TERM GOALS: Target date: 10 sessions  Pt will participate in further assessment of functional reading/writing. Baseline: Goal status: MET  2.  With Moderate A, patient will complete a semantic feature analysis with at least 2 relevant features for 8/10 target words to improve word-finding skills.  Baseline:  Goal status: PROGRESSING  3.  With Maximal A, patient will generate sentences with 3 or more words in response to a situation at 80% accuracy in order to increase ability to communicate basic wants and needs.  Baseline:  Goal status: PROGRESSING  4.   With Maximal A,  pt will follow 2-step commands for improved participation in ADLs/iADLs with max cueing. Baseline:  Goal status: INITIAL  5.  Pt will repeat short sentences (<4 words) with good approximations 80% of the time with max cueing. Baseline:  Goal status: INITIAL    LONG TERM GOALS: Target date: 12 weeks  Pt will report a subjective  improvement in communication per PROM.  Baseline: CES 12/32 on 8/26 Goal status: INITIAL  2.  With Min A, patient/family will demonstrate understanding of the following concepts: aphasia, spontaneous recovery, communication vs conversation, strengths/strategies to promote success, local resources in order to increase patient's participation in medical care.    Baseline:  Goal status: INITIAL   ASSESSMENT:  CLINICAL IMPRESSION: Pt is a 78 y.o. female who presents for communication treatment in setting on stroke. Pt known to SLP services and was receiving OP ST prior to most recent stroke 08/05/23. Pt d/c'd home with [facility] ST who is working on securing SGD for pt. PMHx significant of  HTN, CKD stage IIIB, DMT2 on insulin , CAD, chronic smoking, chronic respiratory failure with COPD and on home oxygen , anxiety, HLD. Assessment this episode completed via functional/dynamic means including Western Aphasia Battery Revised (WAB-R) and PROM (Communication Effectiveness Scale). 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 See details of tx session above. Recommend course of ST targeting functional communication, further  assessment of functional reading/writing, and pt/caregiver training to help promote QoL and overall life participation.   OBJECTIVE IMPAIRMENTS include expressive language, receptive language, and aphasia. These impairments are limiting patient from ADLs/IADLs and effectively communicating at home and in community. Factors affecting potential to achieve goals and functional outcome are severity of impairments. Patient will benefit from skilled SLP services to address above impairments and improve overall function.  REHAB POTENTIAL: Good  PLAN: SLP FREQUENCY: 2x/week  SLP DURATION: 12 weeks  PLANNED INTERVENTIONS: Language facilitation, Cueing hierachy, Internal/external aids, Functional tasks,  Multimodal communication approach, SLP instruction and feedback, Compensatory strategies, and Patient/family education    Delon Bangs, M.S., CCC-SLP Speech-Language Pathologist [facility] [phone] [place])  [facility] at [facility] [address] [place], [place], [address] Phone: [phone]   Fax:  [phone]

## 2023-11-26 ENCOUNTER — Ambulatory Visit: Payer: Self-pay | Admitting: Cardiology

## 2023-11-27 ENCOUNTER — Ambulatory Visit

## 2023-11-28 ENCOUNTER — Ambulatory Visit

## 2023-11-28 ENCOUNTER — Encounter

## 2023-11-29 ENCOUNTER — Ambulatory Visit: Admitting: Occupational Therapy

## 2023-11-29 ENCOUNTER — Ambulatory Visit

## 2023-11-30 ENCOUNTER — Ambulatory Visit

## 2023-11-30 ENCOUNTER — Encounter

## 2023-11-30 DIAGNOSIS — R413 Other amnesia: Secondary | ICD-10-CM | POA: Diagnosis not present

## 2023-11-30 DIAGNOSIS — R202 Paresthesia of skin: Secondary | ICD-10-CM | POA: Diagnosis not present

## 2023-11-30 DIAGNOSIS — R200 Anesthesia of skin: Secondary | ICD-10-CM | POA: Diagnosis not present

## 2023-11-30 DIAGNOSIS — R410 Disorientation, unspecified: Secondary | ICD-10-CM | POA: Diagnosis not present

## 2023-11-30 DIAGNOSIS — R531 Weakness: Secondary | ICD-10-CM | POA: Diagnosis not present

## 2023-11-30 DIAGNOSIS — R479 Unspecified speech disturbances: Secondary | ICD-10-CM | POA: Diagnosis not present

## 2023-11-30 DIAGNOSIS — G459 Transient cerebral ischemic attack, unspecified: Secondary | ICD-10-CM | POA: Diagnosis not present

## 2023-11-30 DIAGNOSIS — Z8673 Personal history of transient ischemic attack (TIA), and cerebral infarction without residual deficits: Secondary | ICD-10-CM | POA: Diagnosis not present

## 2023-11-30 DIAGNOSIS — R270 Ataxia, unspecified: Secondary | ICD-10-CM | POA: Diagnosis not present

## 2023-11-30 DIAGNOSIS — I679 Cerebrovascular disease, unspecified: Secondary | ICD-10-CM | POA: Diagnosis not present

## 2023-11-30 DIAGNOSIS — R519 Headache, unspecified: Secondary | ICD-10-CM | POA: Diagnosis not present

## 2023-11-30 NOTE — Progress Notes (Signed)
 Assessment & Plan  Today the history is gathered from: 100% - patient  0% - patient's daughter  REFERRING PHYSICIAN: Jonette Lauraine Norris,* PRIMARY CARE PHYSICIAN:  Gasper [name] BRAVO, MD   IMPRESSION/PLAN  Hannah Kirby is a 78 y.o. 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 ENT evaluation ongoing with a VNG test scheduled. Current treatment with prednisone  and meclizine , though meclizine  is not providing significant relief. Vestibular rehab therapy is paused due to dizziness severity. Discussed potential risks of using benzodiazepines like Valium  due to increased fall risk, memory issues, and breathing problems, especially given her current medication regimen. - Continue prednisone  as prescribed by ENT. - Continue meclizine  as needed for dizziness. - Proceed with vestibular rehab therapy as tolerated. - Obtain ENT records and VNG results for further evaluation.  Ischemic stroke without residual deficits No new stroke symptoms reported. Recent ER visit showed no new findings on scans. Following closely with cardiology. - Continue aspirin , Plavix , and statin therapy. - Continue follow-up with cardiology. - Patient should know signs and symptoms of stroke, importance of calling 911 as soon as the symptoms start so patient can be a potential candidate for tPA. We also discussed patient's personal stroke risk factors. We gave written educational material, if needed.  Cognitive impairment post-stroke Reported memory loss and increased irritability post-stroke. No recent memory evaluation conducted. Discussion of potential memory medication to preserve cognitive function. Memory test to establish baseline cognitive status. Discussed that memory medications aim to  preserve memory and may not significantly improve it, with common side effects including vivid dreams and gastrointestinal upset. - Memory score today was 22/30. - Provide information on aricept for review. - Consider starting low-dose memory medication based on test results and patient preference.  Depression and anxiety Increased anxiety noted, possibly exacerbated by recent health events. Current Zoloft  dose may not be sufficient. Discussion of potential medication adjustment or alternative treatments. Referral to psychiatry considered for further evaluation. - Increase Zoloft  to 75 mg daily. - Refer to psychiatry for further evaluation and management. - Monitor response to increased Zoloft  dose and adjust as needed.  Recording duration: 22 minutes  Follow up in 2-3 mo  Medications tried: Mirtazapine  15 mg-drowsiness  CHIEF COMPLAIN & HISTORY OF PRESENT ILLNESS:  Hannah Kirby is a 78 y.o. female presenting for evaluation of: Chief Complaint  Patient presents with  .  HISTORY OF STOKE / WEAKNESS/ ATAXIA/ FATIGUE/ PHYSICAL DEC   HISTORY OF STOKE / WEAKNESS/ ATAXIA/ FATIGUE/ PHYSICAL DECONDITIONING / HEADACHE History of Present Illness Hannah Kirby is a 79 year old female with a history of stroke who presents with ongoing dizziness. She was referred by her primary care physician to an ENT specialist for evaluation of dizziness.  She has been experiencing recurrent dizzy spells for the past month. The dizziness is intermittent, with episodes lasting a couple of days and sometimes severe enough to require her to lie down. The dizziness is occasionally accompanied by a sensation of spinning. Initial hospital tests returned normal results, and she was subsequently referred to an ENT specialist. Positional tests conducted by the ENT were negative, and prednisone  was prescribed to address potential vestibular issues. A VNG test is scheduled for November.  She has a history of stroke and  is currently on aspirin , Plavix , and a statin. A recent episode of tachycardia  was detected by her cardiac monitor, but she did not experience any symptoms at the time. No new stroke symptoms have been reported. Her daughter notes elevated anxiety levels, particularly following the tachycardia episode, which led to increased blood pressure and a hospital visit.  She is currently taking meclizine  for dizziness, but it has not been effective. Her daughter reports new memory issues and increased irritability since her last stroke. She is on Zoloft  for anxiety and depression, but her daughter feels it may not be effective. There is a family history of similar dizziness issues, as her mother and sister have experienced similar symptoms.  Reports episodes of tachycardia without associated symptoms.   DATA SUMMARY:  11/04/2023 MR BRAIN WITHOUT CONTRAST IMPRESSION:  1. No acute intracranial abnormality.  2. Age-related cerebral atrophy with moderate to advanced chronic  microvascular ischemic disease, with multiple remote lacunar  infarcts involving the hemispheric cerebral white matter, bilateral  basal ganglia, thalami, and pons.  3. Innumerable chronic micro hemorrhages, most pronounced about the  cerebellum and thalami, consistent with chronic poorly controlled  hypertension.   11/04/2023 CT HEAD WITHOUT CONTRAST IMPRESSION:  1. Stable head CT, no acute intracranial process.   08/05/2023 MRA HEAD AND NECK, BRAIN MR WWO CM IMPRESSION:  1. Small acute infarcts in the left frontal lobe, right parietal  lobe, and left cerebellum.  2. Extensive chronic small vessel ischemic disease.  3. 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  4. Unchanged severe right A3 stenosis or segmental occlusion.  5. Moderate left P2 stenosis.  6. Limited assessment of the left vertebral artery origin, otherwise  negative neck MRA.   08/05/2023 CT HEAD WO CONTRAST IMPRESSION:  1. No acute intracranial  abnormality.  2. Moderate atrophy and white matter disease.  3. Remote white matter infarcts in the corona radiata bilaterally and remote  lacunar infarcts in the right basal ganglia and thalami bilaterally.  4. Atherosclerotic calcifications in the cavernous carotid arteries bilaterally  and at the dural margin of both vertebral arteries. No hyperdense vessel is  present.   03/18/23 MRA HEAD WO IMPRESSION:  1. Strong evidence of Severe stenosis of the distal Left MCA M1  segment.  Left MCA bifurcation appears patent but stenotic. No discrete branch  occlusion when compared to 2018 MRA.  2. Positive also for Severe stenosis or occlusion of right ACA in  the distal A2/A3 segment, also new since 2018.  3. Stable MRA elsewhere, no other intracranial artery stenosis  identified.   03/18/23 US  CAROTID  IMPRESSION:  Mild amount of calcified plaque at the level of both carotid bulbs  and proximal internal carotid arteries. Estimated bilateral ICA  stenosis is less than 50%.   03/17/23 MR BRAIN WO IMPRESSION:  1. Punctate foci of restricted diffusion in the left posterior  frontal white matter, most likely acute infarcts.  2. Numerous foci of hemosiderin deposition, which are seen in the  deep gray structures but also in the bilateral cerebral hemispheres,  with 1 new focus compared to 02/20/2023. While this could be the  sequela of chronic hypertensive microhemorrhages, this appearance is  concerning for cerebral amyloid angiopathy.   03/17/23 CT HEAD WO IMPRESSION:  1. No evidence of acute intracranial abnormality.  2. Severe chronic small vessel ischemic disease.   02/20/23 MR BRAIN WO IMPRESSION:  1. No acute intracranial process. No evidence of acute or subacute  infarct.  2. Numerous foci of hemosiderin deposition, both centrally and  peripherally, many of which are  new from the prior exam. These may  represent the sequela of chronic hypertensive microhemorrhages,   although cerebral amyloid angiopathy can appear similar.    02/27/20 MRI LUMBAR SPINE WITHOUT CONTRAST IMPRESSION:  Multilevel degenerative changes without high-grade stenosis.    12/16/2019 MRI BRAIN IMPRESSION:  1. No acute intracranial process. Chronic lacunar insults involving  the right internal capsule/basal ganglia, right thalamus, corona  radiata and left pons.  2. Mild cerebral atrophy and moderate chronic microvascular ischemic  changes.  3. Sequela of hypertensive microangiopathy, slightly more  conspicuous than prior exam.    08/02/2019 US  CAROTID BILATERAL  IMPRESSION:  Minimal amount of bilateral atherosclerotic plaque, morphologically similar to the 2018 examination, and again not resulting in a hemodynamically significant stenosis within either internal carotid artery.    08/01/2019 MR BRAIN WO CONTRAST IMPRESSION:  1. 8 mm acute lacunar infarct within the right thalamocapsular junction.  2. An additional small acute white matter infarct is questioned within the left corona radiata, as described.  3. Background moderate chronic small vessel ischemic disease with multiple chronic lacunar infarcts, otherwise stable stable.  4. 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  5. Stable, mild generalized parenchymal atrophy.  6. Signal abnormality within the base of the dens suspicious for an odontoid fracture. This finding was not present on prior MRI  10/30/2016. CT of the cervical spine is recommended for further evaluation, as clinically warranted.    08/01/2019 ECHOCARDIOGRAM COMPLETE IMPRESSIONS  1. Left ventricular ejection fraction, by estimation, is 60 to 65%. The left ventricle has normal function. The left ventricle has no regional wall motion abnormalities. There is moderate left ventricular  hypertrophy. Left ventricular diastolic  parameters are indeterminate.  2. Right ventricular systolic function is normal. The right ventricular size is normal. There is normal pulmonary artery systolic pressure.  3. The inferior vena cava is normal in size with greater than 50% respiratory variability, suggesting right atrial pressure of 3 mmHg.   08/01/2019 CT HEAD CODE STROKE  IMPRESSION:  No CT evidence of acute intracranial abnormality.  Moderate chronic small vessel ischemic disease with redemonstrated chronic lacunar infarcts in the bilateral basal ganglia, right thalamus and within the pons.  Stable, mild generalized parenchymal atrophy.    10/30/16  US  CAROTID BILATERAL IMPRESSION: 1. Mild (1-49%) stenosis proximal right internal carotid artery secondary to heterogenous atherosclerotic plaque. 2. Mild (1-49%) stenosis proximal left internal carotid artery secondary to heterogenous atherosclerotic plaque. 3. Vertebral arteries are patent with normal antegrade flow.  10/30/16  MR MRA HEAD/BRAIN WO CM IMPRESSION: 1. 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 2. Moderate chronic small vessel ischemic disease with chronic lacunar infarcts as above. 3. Negative head MRA.  10/29/16  CT HEAD WO CONTRAST IMPRESSION: 1. No acute intracranial process. 2. Moderate to severe chronic small vessel ischemic disease. 3. Critical Value/emergent results were called by telephone at the time of interpretation on 10/29/2016 at 11:23 pm to Dr. Robinette, who verbally acknowledged these results.  VISIT SUMMARIES: 07/10/20: Patient with CVA, stable, and headaches, ongoing. Continue aspirin  to 81 mg daily. Continue Plavix  75 mg daily for secondary stroke prevention. Encouraged patient to stay well hydrated and drink plenty of water.  11/28/18: Patient with CVA, stable, and headaches, ongoing. Decrease aspirin  to 81 mg MWF. Start nortriptyline 10 mg nightly for one week, then increase to 20 mg nightly  and continue. Continue Plavix  75 mg daily for secondary  stroke prevention. Encouraged patient to stay well hydrated and drink plenty of water.  11/21/17: CVA, stable. Continue Aspirin  and Plavix . Headaches, ongoing. Patient will call back if she would like to start medication.   05/22/17: No new stoke symptoms. Headaches, ongoing. Continue aspirin  and plavix . Patient would not like to start medication today to improve headaches.  11/21/16: Patient with recent CVA here for hospital follow-up.  MEDICATIONS Current Outpatient Medications  Medication Sig Dispense Refill  . ACCU-CHEK AVIVA PLUS TEST STRP test strip 1 each (1 strip total) 3 (three) times daily Use as instructed. 300 each 2  . acetaminophen  (TYLENOL ) 500 MG tablet Take 500 mg by mouth every 6 (six) hours as needed for Pain    . albuterol  90 mcg/actuation inhaler Inhale into the lungs once daily    . aspirin  81 MG EC tablet Take 1 tablet (81 mg total) by mouth once daily 90 tablet 1  . blood-glucose meter (ACCU-CHEK AVIVA PLUS METER) Misc Use to test blood sugar.  Accu-chek Aviva meter please 1 each 0  . clopidogreL  (PLAVIX ) 75 mg tablet Take 1 tablet (75 mg total) by mouth once daily 90 tablet 1  . cyanocobalamin  (VITAMIN B12) 1000 MCG tablet Take 1,000 mcg by mouth once daily    . dapagliflozin  propanediol (FARXIGA ) 5 mg tablet Take 5 mg by mouth once daily    . ezetimibe  (ZETIA ) 10 mg tablet Take 10 mg by mouth once daily.    . fluticasone -umeclidinium-vilanterol (TRELEGY ELLIPTA ) 100-62.5-25 mcg inhaler Inhale 1 Puff into the lungs once daily    . HYDROcodone -acetaminophen  (NORCO) 5-325 mg tablet Take 1 tablet by mouth every 6 (six) hours as needed for Pain    . insulin  GLARGINE (LANTUS  SOLOSTAR) pen injector (concentration 100 units/mL) Inject 18 Units subcutaneously at bedtime    . lancets Use 1 each 3 ( three ) times daily as directed. Dispense Accu-chek Fast clix E11.21 300 each 4  . LANTUS  SOLOSTAR U-100 INSULIN  pen injector  (concentration 100 units/mL) INJECT 30 UNITS SUBCUTANEOUSLY EVERY DAY AT BEDTIME (DISCARD 28 DAYS AFTER FIRST USE) 45 mL 3  . lisinopriL  (ZESTRIL ) 20 MG tablet Take 20 mg by mouth once daily    . meclizine  (ANTIVERT ) 25 mg tablet Take 25 mg 1-3 times a day as needed for dizziness 90 tablet 1  . metoprolol  tartrate (LOPRESSOR ) 100 MG tablet Take 100 mg by mouth 2 (two) times daily    . multivitamin with minerals tablet Take 1 tablet by mouth once daily      . nitroGLYcerin  (NITROSTAT ) 0.4 MG SL tablet Place under the tongue as needed    . omega-3 acid ethyl esters (LOVAZA ) 1 gram capsule Take 2 g by mouth 2 (two) times daily      . ondansetron  (ZOFRAN -ODT) 4 MG disintegrating tablet Take 4 mg by mouth once daily as needed    . pen needle, diabetic 32 gauge x 5/16 needle Use as directed 100 each 12  . rosuvastatin  (CRESTOR ) 20 MG tablet Take 20 mg by mouth once daily    . semaglutide (OZEMPIC) 1 mg/dose (4 mg/3 mL) pen injector Inject 0.75 mLs (1 mg total) subcutaneously every 7 (seven) days 9 mL 3  . sertraline  (ZOLOFT ) 50 MG tablet Take 50mg  once daily 30 tablet 2  . sucralfate  (CARAFATE ) 1 gram tablet Take 1 tablet (1 g total) by mouth 3 (three) times a day 270 tablet 0  . tiotropium (SPIRIVA ) 18 mcg inhalation capsule Place into inhaler and inhale    .  tiZANidine (ZANAFLEX) 4 MG capsule Take 4 mg by mouth at bedtime (Patient not taking: Reported on 08/31/2023)     No current facility-administered medications for this visit.    ALLERGIES Allergies  Allergen Reactions  . Spironolactone  Shortness Of Breath  . Codeine Nausea, Vomiting and Other (See Comments)    Nausea  . Other Unknown    CORTISOL  . Statins-Hmg-Coa Reductase Inhibitors Other (See Comments)    Caused elevated liver function studies Caused elevated liver function studies (02/22/17-pt taking rosuvastatin  without problems) Caused elevated liver function studies  . Sulfa (Sulfonamide Antibiotics) Other (See Comments)    Welts  on mouth Mouth blisters Welts on mouth Mouth blisters  . Prednisone  Other (See Comments) and Palpitations    intolerant Made her legs feel weird.     EXAM   There were no vitals filed for this visit.    There is no height or weight on file to calculate BMI.   GENERAL: Very pleasant female, NAD. Normocephalic and atraumatic.  Baseline neurological exam below was obtained at prior office visit. Changes from today's visit appear in bold.    MUSCULOSKELETAL: Bulk - Normal Tone - Normal Pronator Drift - Absent bilaterally. Ambulation - Gait and station are Milyl unsteady. Romberg - deferred for safety  NEUROLOGICAL: MENTAL STATUS: Patient is oriented to person, place and time.   Short-term memory is normal for age. Long-term memory is intact.   Attention span and concentration are intact.   Naming and repetition are intact. Comprehension is intact.   Expressive speech is intact.   Patient's fund of knowledge is within normal limits for educational level.    PAST MEDICAL HISTORY Past Medical History:  Diagnosis Date  . Acute myocardial infarction of anterolateral wall, initial episode of care (CMS/HHS-HCC)   . Anemia   . Arthritis   . Barrett's esophagus 12/30/2014  . CAD (coronary artery disease)    with stents  . Chronic bronchitis (CMS/HHS-HCC)   . COPD (chronic obstructive pulmonary disease) (CMS/HHS-HCC)   . Diabetic nephropathy (CMS/HHS-HCC)   . Diverticulosis 09/23/2014  . Dyslipidemia   . Dyspepsia    Chronic  . GERD (gastroesophageal reflux disease)   . Goiter   . Heart disease   . Hypercalcemia   . Hyperlipidemia   . Hypertension   . Kidney disease   . Microalbuminuria   . Motion sickness   . Radiculopathy of lumbar region 10/30/2013  . Reflux esophagitis 12/30/2014  . Spinal stenosis of lumbar region 10/30/2013  . Spondylolisthesis of lumbar region 10/30/2013  . Stroke (CMS/HHS-HCC)   . Tubular adenoma of colon 09/23/2014  . Type 2  diabetes mellitus (CMS/HHS-HCC)     PAST SURGICAL HISTORY Past Surgical History:  Procedure Laterality Date  . MRI  07/2012  . COLONOSCOPY  09/23/2014   Tubular adenoma/Repeat 31yrs/MUS  . EGD  12/30/2014   Reflux esophagitis/Hiatus hernia/Barrett's Esophagus/Repeat 2yr/MUS  . EGD  07/19/2016   Barrett's Esophagus/GERD/Repeat 58yrs/MUS  . CATARACT EXTRACTION W/ INTRAOCULAR LENS IMPLANT & ANTERIOR VITRECTOMY, BILATERAL Bilateral 02/2017  . COLONOSCOPY  01/11/2018   Tubular adenoma of the colon/Hyperplastic colon polyp/Chronic ulcer/Repeat 4 months/MUS  . COLONOSCOPY  04/24/2018   Dr. EMERSON Mariner @ [facility] - Tubular Adenomas/Hyperplastic Polyp (28mm), fair prep,  rpt 2-3 yrs per MUS  . EGD  04/24/2018   Dr. EMERSON Mariner @ [facility] - Barrett's esophagus, rpt 3 yrs per MUS  . Heart stents     2008, 2019    3 stents total  .  HYSTERECTOMY    . INCISIONAL BIOPSY BREAST Left   . Partial hysterectomy    . UPPER GASTROINTESTINAL ENDOSCOPY      FAMILY HISTORY Family History  Problem Relation Name Age of Onset  . Heart disease Mother    . Stroke Mother    . Lung cancer Father    . Cancer Father    . No Known Problems Sister    . No Known Problems Daughter    . No Known Problems Son    . Cancer Maternal Aunt    . Heart disease Maternal Uncle    . Cancer Paternal Uncle    . No Known Problems Maternal Grandmother    . No Known Problems Maternal Grandfather    . No Known Problems Paternal Grandmother    . No Known Problems Paternal Grandfather    . Colon cancer Neg Hx    . Colon polyps Neg Hx    . Liver disease Neg Hx    . Rectal cancer Neg Hx    . Ulcers Neg Hx      SOCIAL HISTORY  Social History   Tobacco Use  . Smoking status: Every Day    Current packs/day: 1.00    Types: Cigarettes  . Smokeless tobacco: Never  Vaping Use  . Vaping status: Former  Substance Use Topics  . Alcohol use: Yes    Alcohol/week: 0.0 standard drinks of alcohol    Comment: rare  . Drug use: No      REVIEW OF SYSTEMS: 13 system ROS was verbally reviewed with patient. Pertinent positives and negatives are mentioned above in the HPI and all other systems are negative.  DATA   08/02/2019 US  CAROTID BILATERAL  CLINICAL DATA:  CAD. History of stroke/TIA, CAD, hypertension,  hyperlipidemia, diabetes and smoking.   EXAM:  BILATERAL CAROTID DUPLEX ULTRASOUND   TECHNIQUE:  Elnor scale imaging, color Doppler and duplex ultrasound were  performed of bilateral carotid and vertebral arteries in the neck.   COMPARISON:  10/30/2016   FINDINGS:  Criteria: Quantification of carotid stenosis is based on velocity  parameters that correlate the residual internal carotid diameter  with NASCET-based stenosis levels, using the diameter of the distal  internal carotid lumen as the denominator for stenosis measurement.   The following velocity measurements were obtained:   RIGHT   ICA: 91/21 cm/sec   CCA: 49/12 cm/sec   SYSTOLIC ICA/CCA RATIO:  1.9   ECA: 102 cm/sec   LEFT   ICA: 81/27 cm/sec   CCA: 57/14 cm/sec   SYSTOLIC ICA/CCA RATIO:  1.4   ECA: 78 cm/sec   RIGHT CAROTID ARTERY: There is a minimal amount of echogenic plaque  within the right carotid bulb (image 9 and 27). There is a minimal  amount of eccentric echogenic plaque involving the origin and  proximal aspects of the right internal carotid artery (image 35),  not resulting in elevated peak systolic velocities within the  interrogated course of the right internal carotid artery to suggest  a hemodynamically significant stenosis.   RIGHT VERTEBRAL ARTERY:  Antegrade flow   LEFT CAROTID ARTERY: There is a minimal amount of eccentric  echogenic plaque within the left carotid bulb, not resulting in  elevated peak systolic velocities within the interrogated course of  the left internal carotid artery to suggest a hemodynamically  significant stenosis.   LEFT VERTEBRAL ARTERY:  Antegrade flow   IMPRESSION:   Minimal amount of bilateral atherosclerotic plaque, morphologically  similar to the 2018  examination, and again not resulting in a  hemodynamically significant stenosis within either internal carotid  artery.    Electronically Signed    By: Norleen Roulette M.D.    On: 08/02/2019 08:53 Other Result Text  Roulette Norleen, MD - 08/02/2019  Formatting of this note might be different from the original.  CLINICAL DATA:  CAD. History of stroke/TIA, CAD, hypertension,  hyperlipidemia, diabetes and smoking.   EXAM:  BILATERAL CAROTID DUPLEX ULTRASOUND   TECHNIQUE:  Elnor scale imaging, color Doppler and duplex ultrasound were  performed of bilateral carotid and vertebral arteries in the neck.   COMPARISON:  10/30/2016   FINDINGS:  Criteria: Quantification of carotid stenosis is based on velocity  parameters that correlate the residual internal carotid diameter  with NASCET-based stenosis levels, using the diameter of the distal  internal carotid lumen as the denominator for stenosis measurement.   The following velocity measurements were obtained:   RIGHT   ICA: 91/21 cm/sec   CCA: 49/12 cm/sec   SYSTOLIC ICA/CCA RATIO:  1.9   ECA: 102 cm/sec   LEFT   ICA: 81/27 cm/sec   CCA: 57/14 cm/sec   SYSTOLIC ICA/CCA RATIO:  1.4   ECA: 78 cm/sec   RIGHT CAROTID ARTERY: There is a minimal amount of echogenic plaque  within the right carotid bulb (image 9 and 27). There is a minimal  amount of eccentric echogenic plaque involving the origin and  proximal aspects of the right internal carotid artery (image 35),  not resulting in elevated peak systolic velocities within the  interrogated course of the right internal carotid artery to suggest  a hemodynamically significant stenosis.   RIGHT VERTEBRAL ARTERY:  Antegrade flow   LEFT CAROTID ARTERY: There is a minimal amount of eccentric  echogenic plaque within the left carotid bulb, not resulting in  elevated peak systolic velocities  within the interrogated course of  the left internal carotid artery to suggest a hemodynamically  significant stenosis.   LEFT VERTEBRAL ARTERY:  Antegrade flow   IMPRESSION:  Minimal amount of bilateral atherosclerotic plaque, morphologically  similar to the 2018 examination, and again not resulting in a  hemodynamically significant stenosis within either internal carotid  artery.    Electronically Signed    By: Norleen Roulette M.D.    On: 08/02/2019 08:53   Date: 08/02/19  Received From: [facility]     08/01/2019 MR BRAIN WO CONTRAST  EXAM:  MRI HEAD WITHOUT CONTRAST   TECHNIQUE:  Multiplanar, multiecho pulse sequences of the brain and surrounding  structures were obtained without intravenous contrast.   COMPARISON:  Noncontrast head CT performed earlier the same day  08/01/2019, brain MRI/MRA 11/08/2016.   FINDINGS:  Brain:   There is an 8 mm acute lacunar infarct within the right  thalamocapsular junction (series 5, images 26-27) (series 8, image  18).   Additionally, there is a small linear focus of diffusion weighted  hyperintensity within the left corona radiata (series 5, image 31)  (series 8, image 20). There are portions which Hannah Kirby not demonstrate  convincing corresponding T2 shine through on the ADC map, and  additional small acute infarct at this site cannot be exclude. 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   Background moderate patchy T2/FLAIR hyperintensity within the  cerebral white matter is nonspecific, but consistent with chronic  small vessel ischemic disease. Redemonstrated additional chronic  lacunar infarcts within the corona radiata, basal ganglia, right  thalamus and central pons.   Chronic microhemorrhages within the cerebral hemispheres, basal  ganglia, thalami and cerebellum have progressed as compared to the  prior MRI. 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   Stable, mild generalized parenchymal atrophy.   No evidence of intracranial mass.   No extra-axial fluid collection.   No midline shift.   Partially empty sella turcica.   Vascular: Expected proximal arterial flow voids.   Skull and upper cervical spine: No focal marrow lesion. There is a  transversely oriented focus of T1 hypointensity within the base of  the dens (series 7, image 12). Findings are suspicious for an  odontoid fracture.   Sinuses/Orbits: Visualized orbits show no acute finding. Mild  ethmoid sinus mucosal thickening. No significant mastoid effusion.   Impressions 1,2 and 6 below will be called to the ordering clinician  or representative by the Radiologist Assistant, and communication  documented in the PACS or [employer].   IMPRESSION:  1. 8 mm acute lacunar infarct within the right thalamocapsular  junction.  2. An additional small acute white matter infarct is questioned  within the left corona radiata, as described.  3. Background moderate chronic small vessel ischemic disease with  multiple chronic lacunar infarcts, otherwise stable stable.  4. 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  5. Stable, mild generalized parenchymal atrophy.  6. Signal abnormality within the base of the dens suspicious for an  odontoid fracture. This finding was not present on prior MRI  10/30/2016. CT of the cervical spine is recommended for further  evaluation, as clinically warranted.    Electronically Signed    By: Rockey Childs Hannah Kirby    On: 08/01/2019 17:29 Other Result Text  Childs Rockey DASEN, Hannah Kirby - 08/01/2019  Formatting of this note might be different from the original.  CLINICAL DATA:  Stroke suspected.   EXAM:  MRI  HEAD WITHOUT CONTRAST   TECHNIQUE:  Multiplanar, multiecho pulse sequences of the brain and surrounding  structures were obtained without intravenous contrast.   COMPARISON:  Noncontrast head CT performed earlier the same day  08/01/2019, brain MRI/MRA 11/08/2016.   FINDINGS:  Brain:   There is an 8 mm acute lacunar infarct within the right  thalamocapsular junction (series 5, images 26-27) (series 8, image  18).   Additionally, there is a small linear focus of diffusion weighted  hyperintensity within the left corona radiata (series 5, image 31)  (series 8, image 20). There are portions which Hannah Kirby not demonstrate  convincing corresponding T2 shine through on the ADC map, and  additional small acute infarct at this site cannot be exclude. 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   Background moderate patchy T2/FLAIR hyperintensity within the  cerebral white matter is nonspecific, but consistent with chronic  small vessel ischemic disease. Redemonstrated additional chronic  lacunar infarcts within the corona radiata, basal ganglia, right  thalamus and central pons.   Chronic microhemorrhages within the cerebral hemispheres, basal  ganglia, thalami and cerebellum have progressed as compared to the  prior MRI. 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   Stable, mild generalized parenchymal atrophy.   No evidence of intracranial mass.   No extra-axial fluid collection.   No midline shift.  Partially empty sella turcica.   Vascular: Expected proximal arterial flow voids.   Skull and upper cervical spine: No focal marrow lesion. There is a  transversely oriented focus of T1 hypointensity within the base of  the dens (series 7, image 12). Findings are suspicious for an  odontoid fracture.   Sinuses/Orbits: Visualized orbits show no acute  finding. Mild  ethmoid sinus mucosal thickening. No significant mastoid effusion.   Impressions 1,2 and 6 below will be called to the ordering clinician  or representative by the Radiologist Assistant, and communication  documented in the PACS or [employer].   IMPRESSION:  1. 8 mm acute lacunar infarct within the right thalamocapsular  junction.  2. An additional small acute white matter infarct is questioned  within the left corona radiata, as described.  3. Background moderate chronic small vessel ischemic disease with  multiple chronic lacunar infarcts, otherwise stable stable.  4. 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  5. Stable, mild generalized parenchymal atrophy.  6. Signal abnormality within the base of the dens suspicious for an  odontoid fracture. This finding was not present on prior MRI  10/30/2016. CT of the cervical spine is recommended for further  evaluation, as clinically warranted.    Electronically Signed    By: Rockey Childs Hannah Kirby    On: 08/01/2019 17:29   Date: 08/01/19  Received From: [facility]       08/01/2019 ECHOCARDIOGRAM COMPLETE  Ref Range & Units 3 wk ago  BP  mmHg 170/86      Narrative      ECHOCARDIOGRAM REPORT       Patient Name:   Hannah Kirby Date of Exam: 08/01/2019  Medical Rec #:  [id number]        Height:       66.0 in  Accession #:    [id number]       Weight:       176.6 lb  Date of Birth:  Aug 26, 1945        BSA:          1.897 m  Patient Age:    74 years         BP:           170/86 mmHg  Patient Gender: F                HR:           71 bpm.  Exam Location:  [facility]   Procedure: 2D Echo, Cardiac Doppler and Color Doppler   Indications:     Stroke 434.91   History:         Patient has prior history of Echocardiogram examinations, most                    recent 06/28/2018. CAD, Stroke and COPD; Risk Factors:Diabetes.                   History of smoking.   Sonographer:     Christopher Furnace RDCS (AE)  Referring Phys:  [id number] AIMEE SOMERSET  Diagnosing Phys: Evalene Lunger MD    Sonographer Comments: Image acquisition challenging due to COPD.  IMPRESSIONS     1. Left ventricular ejection fraction, by estimation, is 60 to 65%. The left ventricle has normal function. The left ventricle has no regional wall motion abnormalities. There is moderate  left ventricular hypertrophy. Left ventricular diastolic  parameters are indeterminate.   2. Right ventricular systolic function is normal. The right ventricular size is normal. There is normal pulmonary artery systolic pressure.   3. The inferior vena cava is normal in size with greater than 50% respiratory variability, suggesting right atrial pressure of 3 mmHg.   FINDINGS   Left Ventricle: Left ventricular ejection fraction, by estimation, is 60 to 65%. The left ventricle has normal function. The left ventricle has no regional wall motion abnormalities. The left ventricular internal cavity size was normal in size. There is   moderate left ventricular hypertrophy. Left ventricular diastolic parameters are indeterminate.   Right Ventricle: The right ventricular size is normal. No increase in right ventricular wall thickness. Right ventricular systolic function is normal. There is normal pulmonary artery systolic pressure. The tricuspid regurgitant velocity is 1.90 m/s, and   with an assumed right atrial pressure of 10 mmHg, the estimated right ventricular systolic pressure is 24.4 mmHg.   Left Atrium: Left atrial size was normal in size.   Right Atrium: Right atrial size was normal in size.   Pericardium: There is no evidence of pericardial effusion.   Mitral Valve: The mitral valve is normal in structure. Normal mobility of the mitral valve leaflets. Mild mitral valve regurgitation. No evidence of  mitral valve stenosis.   Tricuspid Valve: The tricuspid valve is normal in structure. Tricuspid valve regurgitation is trivial. No evidence of tricuspid stenosis.   Aortic Valve: The aortic valve is normal in structure. Aortic valve regurgitation is not visualized. Mild aortic valve sclerosis is present, with no evidence of aortic valve stenosis. Aortic valve mean gradient measures 2.0 mmHg. Aortic valve peak  gradient measures 3.3 mmHg. Aortic valve area, by VTI measures 2.73 cm.   Pulmonic Valve: The pulmonic valve was normal in structure. Pulmonic valve regurgitation is not visualized. No evidence of pulmonic stenosis.   Aorta: The aortic root is normal in size and structure.   Venous: The inferior vena cava is normal in size with greater than 50% respiratory variability, suggesting right atrial pressure of 3 mmHg.   IAS/Shunts: No atrial level shunt detected by color flow Doppler.    LEFT VENTRICLE  PLAX 2D  LVIDd:         4.43 cm  LVIDs:         2.95 cm  LV PW:         1.15 cm  LV IVS:        1.20 cm  LVOT diam:     2.00 cm  LV SV:         50  LV SV Index:   26  LVOT Area:     3.14 cm    RIGHT VENTRICLE  RV Basal diam:  4.40 cm  RV S prime:     10.60 cm/s  TAPSE (M-mode): 4.1 cm   LEFT ATRIUM             Index       RIGHT ATRIUM           Index  LA diam:        3.60 cm 1.90 cm/m  RA Area:     26.20 cm  LA Vol (A2C):   32.6 ml 17.19 ml/m RA Volume:   94.10 ml  49.61 ml/m  LA Vol (A4C):   39.4 ml 20.77 ml/m  LA Biplane Vol: 37.8 ml 19.93 ml/m   AORTIC VALVE  PULMONIC VALVE  AV Area (Vmax):    2.81 cm    PV Vmax:        0.49 m/s  AV Area (Vmean):   2.43 cm    PV Peak grad:   1.0 mmHg  AV Area (VTI):     2.73 cm    RVOT Peak grad: 1 mmHg  AV Vmax:           90.40 cm/s  AV Vmean:          61.000 cm/s  AV VTI:            0.182 m  AV Peak Grad:      3.3 mmHg  AV Mean Grad:      2.0 mmHg  LVOT Vmax:         80.90 cm/s  LVOT Vmean:        47.100  cm/s  LVOT VTI:          0.158 m  LVOT/AV VTI ratio: 0.87   AORTA  Ao Root diam: 2.50 cm   TRICUSPID VALVE  TR Peak grad:   14.4 mmHg  TR Vmax:        190.00 cm/s   SHUNTS  Systemic VTI:  0.16 m  Systemic Diam: 2.00 cm   Evalene Lunger MD  Electronically signed by Evalene Lunger MD  Signature Date/Time: 08/01/2019/3:10:13 PM       Final   Other Result Text  This result has an attachment that is not available.Gollan, Timothy J, MD - 08/01/2019  Formatting of this note might be different from the original.      ECHOCARDIOGRAM REPORT       Patient Name:   Hannah Kirby Date of Exam: 08/01/2019  Medical Rec #:  [id number]        Height:       66.0 in  Accession #:    [id number]       Weight:       176.6 lb  Date of Birth:  24-Oct-1945        BSA:          1.897 m  Patient Age:    74 years         BP:           170/86 mmHg  Patient Gender: F                HR:           71 bpm.  Exam Location:  [facility]   Procedure: 2D Echo, Cardiac Doppler and Color Doppler   Indications:     Stroke 434.91   History:         Patient has prior history of Echocardiogram examinations, most                   recent 06/28/2018. CAD, Stroke and COPD; Risk Factors:Diabetes.                   History of smoking.   Sonographer:     Christopher Furnace RDCS (AE)  Referring Phys:  [id number] AIMEE SOMERSET  Diagnosing Phys: Evalene Lunger MD    Sonographer Comments: Image acquisition challenging due to COPD.  IMPRESSIONS     1. Left ventricular ejection fraction, by estimation, is 60 to 65%. The left ventricle has normal function. The left ventricle has no regional wall motion abnormalities. There is moderate left ventricular hypertrophy. Left ventricular diastolic  parameters are indeterminate.  2. Right ventricular systolic function is normal. The right ventricular size is normal. There is normal pulmonary artery systolic pressure.   3. The inferior vena cava is normal in size with greater than 50%  respiratory variability, suggesting right atrial pressure of 3 mmHg.   FINDINGS   Left Ventricle: Left ventricular ejection fraction, by estimation, is 60 to 65%. The left ventricle has normal function. The left ventricle has no regional wall motion abnormalities. The left ventricular internal cavity size was normal in size. There is   moderate left ventricular hypertrophy. Left ventricular diastolic parameters are indeterminate.   Right Ventricle: The right ventricular size is normal. No increase in right ventricular wall thickness. Right ventricular systolic function is normal. There is normal pulmonary artery systolic pressure. The tricuspid regurgitant velocity is 1.90 m/s, and   with an assumed right atrial pressure of 10 mmHg, the estimated right ventricular systolic pressure is 24.4 mmHg.   Left Atrium: Left atrial size was normal in size.   Right Atrium: Right atrial size was normal in size.   Pericardium: There is no evidence of pericardial effusion.   Mitral Valve: The mitral valve is normal in structure. Normal mobility of the mitral valve leaflets. Mild mitral valve regurgitation. No evidence of mitral valve stenosis.   Tricuspid Valve: The tricuspid valve is normal in structure. Tricuspid valve regurgitation is trivial. No evidence of tricuspid stenosis.   Aortic Valve: The aortic valve is normal in structure. Aortic valve regurgitation is not visualized. Mild aortic valve sclerosis is present, with no evidence of aortic valve stenosis. Aortic valve mean gradient measures 2.0 mmHg. Aortic valve peak  gradient measures 3.3 mmHg. Aortic valve area, by VTI measures 2.73 cm.   Pulmonic Valve: The pulmonic valve was normal in structure. Pulmonic valve regurgitation is not visualized. No evidence of pulmonic stenosis.   Aorta: The aortic root is normal in size and structure.   Venous: The inferior vena cava is normal in size with greater than 50% respiratory variability, suggesting  right atrial pressure of 3 mmHg.   IAS/Shunts: No atrial level shunt detected by color flow Doppler.    LEFT VENTRICLE  PLAX 2D  LVIDd:         4.43 cm  LVIDs:         2.95 cm  LV PW:         1.15 cm  LV IVS:        1.20 cm  LVOT diam:     2.00 cm  LV SV:         50  LV SV Index:   26  LVOT Area:     3.14 cm    RIGHT VENTRICLE  RV Basal diam:  4.40 cm  RV S prime:     10.60 cm/s  TAPSE (M-mode): 4.1 cm   LEFT ATRIUM             Index       RIGHT ATRIUM           Index  LA diam:        3.60 cm 1.90 cm/m  RA Area:     26.20 cm  LA Vol (A2C):   32.6 ml 17.19 ml/m RA Volume:   94.10 ml  49.61 ml/m  LA Vol (A4C):   39.4 ml 20.77 ml/m  LA Biplane Vol: 37.8 ml 19.93 ml/m   AORTIC VALVE  PULMONIC VALVE  AV Area (Vmax):    2.81 cm    PV Vmax:        0.49 m/s  AV Area (Vmean):   2.43 cm    PV Peak grad:   1.0 mmHg  AV Area (VTI):     2.73 cm    RVOT Peak grad: 1 mmHg  AV Vmax:           90.40 cm/s  AV Vmean:          61.000 cm/s  AV VTI:            0.182 m  AV Peak Grad:      3.3 mmHg  AV Mean Grad:      2.0 mmHg  LVOT Vmax:         80.90 cm/s  LVOT Vmean:        47.100 cm/s  LVOT VTI:          0.158 m  LVOT/AV VTI ratio: 0.87   AORTA  Ao Root diam: 2.50 cm   TRICUSPID VALVE  TR Peak grad:   14.4 mmHg  TR Vmax:        190.00 cm/s   SHUNTS  Systemic VTI:  0.16 m  Systemic Diam: 2.00 cm   Evalene Lunger MD  Electronically signed by Evalene Lunger MD  Signature Date/Time: 08/01/2019/3:10:13 PM       Final     Date: 08/01/19     08/01/2019 CT HEAD CODE STROKE CLINICAL DATA:  Code stroke. Possible stroke; neuro deficit, acute,  stroke suspected. Additional history provided: Left leg weakness  beginning 7:30 a.m.   EXAM:  CT HEAD WITHOUT CONTRAST   TECHNIQUE:  Contiguous axial images were obtained from the base of the skull  through the vertex without intravenous contrast.   COMPARISON:  Brain MRI/MRA 10/30/2016.   FINDINGS:   Brain:   Redemonstrated chronic lacunar infarcts within the bilateral basal  ganglia, right thalamus and within the pons.   Additional patchy ill-defined hypoattenuation within the cerebral  white matter is nonspecific, but consistent with chronic small  vessel ischemic disease. Stable, mild generalized parenchymal  atrophy.   There is no acute intracranial hemorrhage.   No acute demarcated cortical infarct is identified.   No extra-axial fluid collection.   No evidence of intracranial mass.   No midline shift.   Vascular: No definite hyperdense vessel. Atherosclerotic  calcifications.   Skull: Normal. Negative for fracture or focal lesion.   Sinuses/Orbits: Visualized orbits show no acute finding. No  significant paranasal sinus disease or mastoid effusion at the  imaged levels.   These results were called by telephone at the time of interpretation  on 08/01/2019 at 12:17 pm to provider PHILLIP STAFFORD , who verbally  acknowledged these results.   IMPRESSION:  No CT evidence of acute intracranial abnormality.   Moderate chronic small vessel ischemic disease with redemonstrated  chronic lacunar infarcts in the bilateral basal ganglia, right  thalamus and within the pons.   Stable, mild generalized parenchymal atrophy.    Electronically Signed    By: Rockey Childs Hannah Kirby    On: 08/01/2019 12:18 Other Result Text  Childs Rockey DASEN, Hannah Kirby - 08/01/2019  Formatting of this note might be different from the original.  CLINICAL DATA:  Code stroke. Possible stroke; neuro deficit, acute,  stroke suspected. Additional history provided: Left leg weakness  beginning 7:30 a.m.   EXAM:  CT HEAD WITHOUT CONTRAST   TECHNIQUE:  Contiguous  axial images were obtained from the base of the skull  through the vertex without intravenous contrast.   COMPARISON:  Brain MRI/MRA 10/30/2016.   FINDINGS:  Brain:   Redemonstrated chronic lacunar infarcts within the bilateral basal   ganglia, right thalamus and within the pons.   Additional patchy ill-defined hypoattenuation within the cerebral  white matter is nonspecific, but consistent with chronic small  vessel ischemic disease. Stable, mild generalized parenchymal  atrophy.   There is no acute intracranial hemorrhage.   No acute demarcated cortical infarct is identified.   No extra-axial fluid collection.   No evidence of intracranial mass.   No midline shift.   Vascular: No definite hyperdense vessel. Atherosclerotic  calcifications.   Skull: Normal. Negative for fracture or focal lesion.   Sinuses/Orbits: Visualized orbits show no acute finding. No  significant paranasal sinus disease or mastoid effusion at the  imaged levels.   These results were called by telephone at the time of interpretation  on 08/01/2019 at 12:17 pm to provider PHILLIP STAFFORD , who verbally  acknowledged these results.   IMPRESSION:  No CT evidence of acute intracranial abnormality.   Moderate chronic small vessel ischemic disease with redemonstrated  chronic lacunar infarcts in the bilateral basal ganglia, right  thalamus and within the pons.   Stable, mild generalized parenchymal atrophy.    Electronically Signed    By: Rockey Childs Hannah Kirby    On: 08/01/2019 12:18   Date: 08/01/19  Received From: [facility]        __________________________________________________  10/30/16: US  CAROTID BILATERAL CLINICAL DATA:  78 year old female with left upper extremity weakness  EXAM: BILATERAL CAROTID DUPLEX ULTRASOUND  TECHNIQUE: Elnor scale imaging, color Doppler and duplex ultrasound were performed of bilateral carotid and vertebral arteries in the neck.  COMPARISON:  Head CT 10/29/2016  FINDINGS: Criteria: Quantification of carotid stenosis is based on velocity parameters that correlate the residual internal carotid diameter with NASCET-based stenosis levels, using the diameter of the distal internal  carotid lumen as the denominator for stenosis measurement.  The following velocity measurements were obtained:  RIGHT  ICA:  80/21 cm/sec  CCA:  86/13 cm/sec  SYSTOLIC ICA/CCA RATIO:  0.9  DIASTOLIC ICA/CCA RATIO:  1.6  ECA:  109 cm/sec  LEFT  ICA:  97/29 cm/sec  CCA:  74/16 cm/sec  SYSTOLIC ICA/CCA RATIO:  1.3  DIASTOLIC ICA/CCA RATIO:  1.8  ECA:  84 cm/sec  RIGHT CAROTID ARTERY: Mild focal heterogeneous plaque in the proximal internal carotid artery. By peak systolic velocity criteria, the estimated stenosis is less than 50%.  RIGHT VERTEBRAL ARTERY:  Patent with normal antegrade flow.  LEFT CAROTID ARTERY: Mild heterogeneous atherosclerotic plaque in the proximal internal carotid artery. By peak systolic velocity criteria the estimated stenosis remains less than 50%.  LEFT VERTEBRAL ARTERY:  Patent with normal antegrade flow.  IMPRESSION: 1. Mild (1-49%) stenosis proximal right internal carotid artery secondary to heterogenous atherosclerotic plaque. 2. Mild (1-49%) stenosis proximal left internal carotid artery secondary to heterogenous atherosclerotic plaque. 3. Vertebral arteries are patent with normal antegrade flow. __________________________________________________  10/30/16: MR MRA HEAD/BRAIN WO CM CLINICAL DATA:  Left upper extremity weakness.  EXAM: MRI HEAD WITHOUT CONTRAST  MRA HEAD WITHOUT CONTRAST  TECHNIQUE: Multiplanar, multiecho pulse sequences of the brain and surrounding structures were obtained without intravenous contrast. Angiographic images of the head were obtained using MRA technique without contrast.  COMPARISON:  Head CT 10/29/2016  FINDINGS: MRI HEAD FINDINGS  Brain: There is a small  acute right lateral lenticulostriate territory infarct involving the posterior lentiform nucleus, external capsule, and corona radiata. Chronic microhemorrhages are noted at the level of the insula bilaterally as well as in the posterior  left temporal lobe. There are chronic lacunar infarcts involving the bilateral basal ganglia and right thalamus as well as left paracentral pons. Patchy subcortical and deep cerebral white matter T2 hyperintensities are compatible with moderate chronic small vessel ischemic disease. There is mild generalized cerebral atrophy. No mass, midline shift, or extra-axial fluid collection is seen.  Vascular: Major intracranial vascular flow voids are preserved.  Skull and upper cervical spine: Unremarkable bone marrow signal. 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  Sinuses/Orbits: Unremarkable orbits. Paranasal sinuses and mastoid air cells are clear.  Other: None.  MRA HEAD FINDINGS  The visualized distal vertebral arteries are widely patent to the basilar with the right being minimally larger than the left. Patent PICA and SCA origins are visualized bilaterally. The basilar artery is widely patent. Posterior communicating arteries are diminutive or absent. PCAs are patent without evidence of significant stenosis.  The internal carotid arteries are patent from skullbase to carotid termini without evidence of significant stenosis. ACAs and MCAs are patent without evidence of proximal branch occlusion or significant stenosis. No aneurysm is identified.  IMPRESSION: 1. 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 2. Moderate chronic small vessel ischemic disease with chronic lacunar infarcts as above. 3. Negative head MRA. __________________________________________________  10/29/16 CT HEAD WO CONTRAST CLINICAL DATA:  LEFT arm weakness beginning this afternoon. History of hypertension, hyperlipidemia, diabetes.  EXAM: CT HEAD WITHOUT CONTRAST  TECHNIQUE: Contiguous axial images were obtained from the base of the skull through the vertex without intravenous contrast.  COMPARISON:  None.  FINDINGS: BRAIN: No intraparenchymal hemorrhage, mass effect nor  midline shift. The ventricles and sulci are normal for age. Patchy to confluent supratentorial white matter hypodensities including cystic changes LEFT corona radiata. No acute large vascular territory infarcts. No abnormal extra-axial fluid collections. Basal cisterns are patent.  VASCULAR: Mile calcific atherosclerosis of the carotid siphons. Dolichoectatic intracranial vessels compatible with chronic hypertension.  SKULL: No skull fracture. Moderate RIGHT temporomandibular osteoarthrosis. No significant scalp soft tissue swelling.  SINUSES/ORBITS: The mastoid air-cells and included paranasal sinuses are well-aerated.The included ocular globes and orbital contents are non-suspicious.  OTHER: None.  IMPRESSION: 1. No acute intracranial process. 2. Moderate to severe chronic small vessel ischemic disease. 3. Critical Value/emergent results were called by telephone at the time of interpretation on 10/29/2016 at 11:23 pm to Dr. Robinette, who verbally acknowledged these results. __________________________________________________  Office Visit on 07/28/2023  Component Date Value Ref Range Status  . Hemoglobin A1C 07/28/2023 7.7 (H)  4.2 - 5.6 % Final  . Average Blood Glucose (Calc) 07/28/2023 174  mg/dL Final   No follow-ups on file.  Payor: MEDICARE / Plan: MEDICARE A AND B / Product Type: Medicare /   This note is partially written by Lauraine Hales, in the presence of and acting as the scribe of Lauraine Rocks, PA-C.     I agree that the scribe documentation is complete and accurate.  This note was generated in part with voice recognition software and I apologize for any typographical errors that were not detected and corrected.     Attestation Statement:   I personally performed the service, non-incident to. [facility])   SARAH ELIZABETH MASON, PA       This note has been created using automated tools and reviewed for accuracy by [facility]  ELIZABETH MASON.

## 2023-12-04 ENCOUNTER — Ambulatory Visit

## 2023-12-05 ENCOUNTER — Ambulatory Visit: Admitting: Occupational Therapy

## 2023-12-05 ENCOUNTER — Ambulatory Visit

## 2023-12-05 ENCOUNTER — Encounter

## 2023-12-05 DIAGNOSIS — E785 Hyperlipidemia, unspecified: Secondary | ICD-10-CM | POA: Diagnosis not present

## 2023-12-05 DIAGNOSIS — E1159 Type 2 diabetes mellitus with other circulatory complications: Secondary | ICD-10-CM | POA: Diagnosis not present

## 2023-12-05 DIAGNOSIS — E1121 Type 2 diabetes mellitus with diabetic nephropathy: Secondary | ICD-10-CM | POA: Diagnosis not present

## 2023-12-05 DIAGNOSIS — I152 Hypertension secondary to endocrine disorders: Secondary | ICD-10-CM | POA: Diagnosis not present

## 2023-12-05 DIAGNOSIS — Z794 Long term (current) use of insulin: Secondary | ICD-10-CM | POA: Diagnosis not present

## 2023-12-05 DIAGNOSIS — E1169 Type 2 diabetes mellitus with other specified complication: Secondary | ICD-10-CM | POA: Diagnosis not present

## 2023-12-06 ENCOUNTER — Ambulatory Visit

## 2023-12-07 ENCOUNTER — Ambulatory Visit

## 2023-12-07 ENCOUNTER — Encounter

## 2023-12-08 ENCOUNTER — Ambulatory Visit: Payer: Self-pay

## 2023-12-08 NOTE — Telephone Encounter (Signed)
 FYI Only or Action Required?: Action required by provider: request for appointment, clinical question for provider, and update on patient condition.  Patient was last seen in primary care on 11/13/2023 by Hannah [name] BRAVO, MD.  Called Nurse Triage reporting Medication Refill and Dizziness.  Symptoms began several months ago.  Interventions attempted: Rest, hydration, or home remedies and Other: followed up with neurology and has ENT appointment set up.  Symptoms are: intermittent dizziness (not present now) describes as vertigo, anxiety unchanged.  Triage Disposition: See Within 3 Days in Office (overriding See Physician Within 24 Hours)  Patient/caregiver understands and will follow disposition?: Yes              Copied from CRM [id number]. Topic: Clinical - Red Word Triage >> Dec 08, 2023  3:52 PM Deaijah H wrote: Red Word that prompted transfer to Nurse Triage: Patients daughter Hannah Kirby called in to follow up on felodipine  (PLENDIL ) 5 MG 24 hr tablet Dizziness & curious if it's from felodipine  cause she's unsure happened last month or two. Unsure of previous reaction to amlodipine  (maybe dizziness from it) . Did reach out to [facility] for F/U on felodipine  but [facility] disconnected once checkin in with patient daughter Hannah Kirby Reason for Disposition  [1] NO dizziness now AND [2] one or more stroke risk factors (i.e., hypertension, diabetes, prior stroke/TIA/heart attack)  Answer Assessment - Initial Assessment Questions Daughter calling originally for felodipine  refill which has been denied by [facility]. She states denied because she had a reaction/allergy to amlodipine . She states it got her thinking that it may be the felodipine  that has been causing the patient's dizziness. She states the patient has been taking meclizine  which has not been helping. Patient has missed PT and OT due to dizziness.   1. DESCRIPTION: Describe your dizziness.     No dizziness present today but was dizzy the past  2 days. Daughter states she has some good days and some bad days. Describes it like the room is spinning, comes on suddenly and no known triggers. She lies down and closes her eyes to help.  2. VERTIGO: Do you feel like either you or the room is spinning or tilting?      Yes.  3. LIGHTHEADED: Do you feel lightheaded? (e.g., somewhat faint, woozy, weak upon standing)     No.  4. SEVERITY: How bad is it?  Can you walk?     No present now.  5. ONSET:  When did the dizziness begin?     2 months. Intermittent, not every day.  6. AGGRAVATING FACTORS: Does anything make it worse? (e.g., standing, change in head position)     Everything. She can't put her finger on it, unsure what it is triggering it.  7. CAUSE: What do you think is causing the dizziness?     Daughter is thinking the felodipine  could be the cause of the dizziness. She is not sure but was asking if the patient was having dizzy spells on amlodipine .  8. RECURRENT SYMPTOM: Have you had dizziness before? If Yes, ask: When was the last time? What happened that time?     Yes. Patient seen in ED 11/04/23 for dizziness. Patient followed up with Dr Hannah and neurology. She has an appointment with ENT.  9. OTHER SYMPTOMS: Do you have any other symptoms? (e.g., earache, headache, numbness, tinnitus, vomiting, weakness)     Anxiety. Denies unilateral numbness or weakness, headache, vomiting, nausea, earache, tinnitus, changes in speech or vision, new confusion.  Protocols used: Dizziness - Vertigo-A-AH

## 2023-12-08 NOTE — Telephone Encounter (Signed)
 she can have a same day slot Monday (11:20, 1:00, or 4pm) or Wednesday (8:00, 11:20, 1:00, or 4pm)

## 2023-12-11 ENCOUNTER — Ambulatory Visit

## 2023-12-11 DIAGNOSIS — M6281 Muscle weakness (generalized): Secondary | ICD-10-CM

## 2023-12-11 DIAGNOSIS — R278 Other lack of coordination: Secondary | ICD-10-CM | POA: Diagnosis not present

## 2023-12-11 DIAGNOSIS — R2681 Unsteadiness on feet: Secondary | ICD-10-CM

## 2023-12-11 DIAGNOSIS — R269 Unspecified abnormalities of gait and mobility: Secondary | ICD-10-CM

## 2023-12-11 DIAGNOSIS — R2689 Other abnormalities of gait and mobility: Secondary | ICD-10-CM

## 2023-12-11 DIAGNOSIS — R262 Difficulty in walking, not elsewhere classified: Secondary | ICD-10-CM | POA: Diagnosis not present

## 2023-12-11 DIAGNOSIS — R482 Apraxia: Secondary | ICD-10-CM

## 2023-12-11 DIAGNOSIS — R4701 Aphasia: Secondary | ICD-10-CM

## 2023-12-11 DIAGNOSIS — R42 Dizziness and giddiness: Secondary | ICD-10-CM | POA: Diagnosis not present

## 2023-12-11 NOTE — Telephone Encounter (Signed)
 Left vm to call back to schedule.  Okay for E2C2 to schedule appt.

## 2023-12-11 NOTE — Therapy (Signed)
 OUTPATIENT SPEECH LANGUAGE PATHOLOGY APHASIA TREATMENT   Patient Name: Hannah Kirby MRN: [id number] DOB:[birth year], 78 y.o., female Today's Date: 12/11/2023  PCP: [name] Perry, MD REFERRING PROVIDER: [name] Perry, MD   End of Session - 12/11/23 1510     Visit Number 11    Number of Visits 23    Date for Recertification  01/09/24    Progress Note Due on Visit 20    SLP Start Time 1530    SLP Stop Time  1615    SLP Time Calculation (min) 45 min    Activity Tolerance Patient tolerated treatment well          Past Medical History:  Diagnosis Date   Acute hemorrhagic colitis 08/08/2019   Anemia    C. difficile colitis 08/09/2019   CAD (coronary artery disease)    a. 02/2006 PCI: BMS x 2 to RCA, cath o/w without significant coronary disease; b. nuclear stress test 07/2014: No ischemia/infarct; c. 11/2017 MV: no isch/infarct, EF 55-65%; d. 03/2020 NSTEMI/PCI: LM nl, LAD min irregs, RI 25, small, LCX nl, RCA 30p/m ISR, 99d (3.0x15 Resolute Onyx DES).   Cataract 02/04/2018   Cerebrovascular accident (CVA) (HCC) 03/18/2023   dysarthria   Chronic bronchitis (HCC)    secondary to cigarette smoking   FHx: allergies    Goiter    Granulomatous disease    Hernia    Kidney stone on left side 2013   NSTEMI (non-ST elevated myocardial infarction) (HCC) 03/23/2020   Oxygen  deficiency    Panic attacks    PVC's (premature ventricular contractions)    a. 03/2018 Zio: Occas PVCs (2.5%). Triggered events assoc w/ PVC/PAC.   Retinal detachment, left 03/04/2020   Retinal tear 2020   Stroke (HCC) 10/29/2016   mild left side weakness   Stroke (HCC) 08/01/2019   Tobacco abuse    Past Surgical History:  Procedure Laterality Date   ABDOMINAL HYSTERECTOMY     BREAST SURGERY     CATARACT EXTRACTION W/PHACO Right 03/01/2017   Procedure: CATARACT EXTRACTION PHACO AND INTRAOCULAR LENS PLACEMENT (IOC) RIGHT DIABETIC;  Surgeon: Mittie Gaskin, MD;  Location[facility]ERY [facility];   Service: Ophthalmology;  Laterality: Right;   CATARACT EXTRACTION W/PHACO Left 03/22/2017   Procedure: CATARACT EXTRACTION PHACO AND INTRAOCULAR LENS PLACEMENT (IOC) LEFT DIABETIC;  Surgeon: Mittie Gaskin, MD;  Location: [facility];  Service: Ophthalmology;  Laterality: Left;  Diabetic - insulin  and oral meds   COLONOSCOPY WITH PROPOFOL  N/A 09/23/2014   Procedure: COLONOSCOPY WITH PROPOFOL ;  Surgeon: Gladis RAYMOND Mariner, MD;  Location: [facility] ENDOSCOPY;  Service: Endoscopy;  Laterality: N/A;   COLONOSCOPY WITH PROPOFOL  N/A 01/11/2018   Procedure: COLONOSCOPY WITH PROPOFOL ;  Surgeon: Mariner Gladis RAYMOND, MD;  Location: [facility] ENDOSCOPY;  Service: Endoscopy;  Laterality: N/A;   COLONOSCOPY WITH PROPOFOL  N/A 04/24/2018   Procedure: COLONOSCOPY WITH PROPOFOL ;  Surgeon: Mariner Gladis RAYMOND, MD;  Location: [facility] ENDOSCOPY;  Service: Endoscopy;  Laterality: N/A;   COLONOSCOPY WITH PROPOFOL  N/A 11/19/2019   Procedure: COLONOSCOPY WITH PROPOFOL ;  Surgeon: Jinny Carmine, MD;  Location: [facility] ENDOSCOPY;  Service: Endoscopy;  Laterality: N/A;   CORONARY ANGIOPLASTY WITH STENT PLACEMENT  2008   CORONARY STENT INTERVENTION N/A 03/24/2020   Procedure: CORONARY STENT INTERVENTION;  Surgeon: Mady Bruckner, MD;  Location: [facility] INVASIVE CV LAB;  Service: Cardiovascular;  Laterality: N/A;   ESOPHAGOGASTRODUODENOSCOPY (EGD) WITH PROPOFOL  N/A 12/30/2014   Procedure: ESOPHAGOGASTRODUODENOSCOPY (EGD) WITH PROPOFOL ;  Surgeon: Gladis RAYMOND Mariner, MD;  Location: [facility] ENDOSCOPY;  Service: Endoscopy;  Laterality: N/A;   ESOPHAGOGASTRODUODENOSCOPY (EGD) WITH PROPOFOL  N/A 07/19/2016   Procedure: ESOPHAGOGASTRODUODENOSCOPY (EGD) WITH PROPOFOL ;  Surgeon: Gaylyn Gladis PENNER, MD;  Location: [facility] ENDOSCOPY;  Service: Endoscopy;  Laterality: N/A;   ESOPHAGOGASTRODUODENOSCOPY (EGD) WITH PROPOFOL  N/A 04/24/2018   Procedure: ESOPHAGOGASTRODUODENOSCOPY (EGD) WITH PROPOFOL ;  Surgeon: Gaylyn Gladis PENNER, MD;  Location: [facility]  ENDOSCOPY;  Service: Endoscopy;  Laterality: N/A;   EYE SURGERY  cataracts, detached retina   hysterectomy (other)     LEFT HEART CATH AND CORONARY ANGIOGRAPHY N/A 03/24/2020   Procedure: LEFT HEART CATH AND CORONARY ANGIOGRAPHY;  Surgeon: Mady Bruckner, MD;  Location: [facility] INVASIVE CV LAB;  Service: Cardiovascular;  Laterality: N/A;   Patient Active Problem List   Diagnosis Date Noted   Stroke (HCC) 08/05/2023   Depression with anxiety 08/05/2023   Overweight (BMI 25.0-29.9) 08/05/2023   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) 03/17/2023   Dizziness 02/23/2023   Restrictive airway disease 07/21/2020   Partial thickness rotator cuff tear 03/20/2020   Shoulder pain 03/05/2020   Ataxia 02/28/2020   Difficulty walking 02/28/2020   Physical deconditioning 02/28/2020   Chronic diastolic congestive heart failure (HCC) 11/20/2019   Polyp of ascending colon    Hypokalemia 08/08/2019   TIA (transient ischemic attack) 08/01/2019   Benign hypertensive kidney disease with chronic kidney disease 04/25/2019   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/24/2018   Chronic, continuous use of opioids 03/26/2018   History of adenomatous polyp of colon 01/22/2018   Diverticulosis of colon 01/22/2018   History of CVA (cerebrovascular accident) 11/08/2016   Weakness of left upper extremity 10/30/2016   AAA (abdominal aortic aneurysm) without rupture 08/12/2016   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   Stage 3b chronic kidney disease (HCC) 07/27/2015   Type 2 diabetes mellitus with diabetic chronic kidney disease (HCC) 10/28/2014   Solitary pulmonary nodule on lung CT 08/20/2014   Allergic rhinitis 07/28/2014   Acid reflux 07/28/2014   Chronic obstructive pulmonary disease (HCC) 07/28/2014   Granuloma annulare    Back pain 11/12/2013   Lumbar scoliosis 10/30/2013   Lumbar radiculopathy 10/30/2013   Lumbar canal stenosis 10/30/2013   Proteinuria 10/22/2013   Smokes  with greater than 30 pack year history 03/21/2011   Edema 08/18/2010   Hyperlipidemia 03/25/2009   Coronary artery disease 03/25/2009   Primary hypertension 03/25/2009    ONSET DATE: 03/17/23 (admitted with stroke), 05/23/23 (referral date)  REFERRING DIAG:  R41.841 (ICD-10-CM) - Cognitive communication deficit  I63.9 (ICD-10-CM) - CVA (cerebral vascular accident) (HCC)  R47.02 (ICD-10-CM) - Dysphasia    THERAPY DIAG:  Aphasia  Apraxia of speech  Rationale for Evaluation and Treatment Rehabilitation  SUBJECTIVE:   SUBJECTIVE STATEMENT: Pt alert, pleasant, and cooperative.  Pt accompanied by: self and family member  PERTINENT HISTORY:  Pt is a 78 y.o. female who presents for communication evaluation in setting on stroke. Pt in ED 1/24-1/25/25. Pt d/c'd home with HH ST. PMHx significant of   HTN, CKD stage IIIB, DMT2 on insulin , CAD, chronic smoking, chronic respiratory failure with COPD and on home oxygen , anxiety, HLD. MRI 03/17/23 1. Punctate foci of restricted diffusion in the left posterior  frontal white matter, most likely acute infarcts.  2. Numerous foci of hemosiderin deposition, which are seen in the  deep gray structures but also in the bilateral cerebral hemispheres,  with 1 new focus compared to 02/20/2023. While this could be the  sequela of chronic hypertensive microhemorrhages, this appearance is  concerning for cerebral amyloid angiopathy.  PAIN:  Are you having pain? No  FALLS: defer to PT  LIVING ENVIRONMENT: Lives with: lives with their spouse Lives in: House/apartment  PLOF:  Level of assistance: Independent with ADLs Employment: Retired   PATIENT GOALS    for communication to improve   OBJECTIVE:  TODAY'S TREATMENT:  Pt endorsed frustration with CLOF and lack of progress due to illness. Last ST visit 11/23/23. Daughter mentioned possible medication changes due to anxiety and memory changes following recent Neurology visit. Supportive  counseling provided as well as education re: importance of sleep, physical activity, mental health, diet, and managing medical conditions in overall cognitive health.   Pt also identified difficulty utilizing microwave as a source of frustration. If daughter bring sphoto of microwave control panel, will address in upcoming sessions.   Pt utilized SFA with mod/max A during structured tasks - providing 3 details x5 objects.  Pt with increased difficulty utilizing SFA compared to previous sessions. Decreased fluency and length of utterance noted during today's session. Pt contributes to fatigue and malaise.      PATIENT EDUCATION: Education details: as above Person educated: Patient and Child Education method: Explanation Education comprehension: verbalized understanding and needs further education  HOME EXERCISE PROGRAM:    SGD home practice worksheet    GOALS:  Goals reviewed with patient? Yes  SHORT TERM GOALS: Target date: 10 sessions  Pt will participate in further assessment of functional reading/writing. Baseline: Goal status: MET  2.  With Moderate A, patient will complete a semantic feature analysis with at least 2 relevant features for 8/10 target words to improve word-finding skills.  Baseline:  Goal status: PROGRESSING  3.  With Maximal A, patient will generate sentences with 3 or more words in response to a situation at 80% accuracy in order to increase ability to communicate basic wants and needs.  Baseline:  Goal status: PROGRESSING  4.   With Maximal A,  pt will follow 2-step commands for improved participation in ADLs/iADLs with max cueing. Baseline:  Goal status: INITIAL  5.  Pt will repeat short sentences (<4 words) with good approximations 80% of the time with max cueing. Baseline:  Goal status: INITIAL    LONG TERM GOALS: Target date: 12 weeks  Pt will report a subjective improvement in communication per PROM.  Baseline: CES 12/32 on 8/26 Goal  status: INITIAL  2.  With Min A, patient/family will demonstrate understanding of the following concepts: aphasia, spontaneous recovery, communication vs conversation, strengths/strategies to promote success, local resources in order to increase patient's participation in medical care.    Baseline:  Goal status: INITIAL   ASSESSMENT:  CLINICAL IMPRESSION: Pt is a 78 y.o. female who presents for communication treatment in setting on stroke. Pt known to SLP services and was receiving OP ST prior to most recent stroke 08/05/23. Pt d/c'd home with [facility] ST who is working on securing SGD for pt. PMHx significant of  HTN, CKD stage IIIB, DMT2 on insulin , CAD, chronic smoking, chronic respiratory failure with COPD and on home oxygen , anxiety, HLD. Assessment this episode completed via functional/dynamic means including Western Aphasia Battery Revised (WAB-R) and PROM (Communication Effectiveness Scale). 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 See details of tx session above. Recommend course of ST targeting functional communication, further assessment of functional reading/writing, and pt/caregiver training to help promote QoL and overall life participation.   OBJECTIVE IMPAIRMENTS include expressive language, receptive language, and aphasia.  These impairments are limiting patient from ADLs/IADLs and effectively communicating at home and in community. Factors affecting potential to achieve goals and functional outcome are severity of impairments. Patient will benefit from skilled SLP services to address above impairments and improve overall function.  REHAB POTENTIAL: Good  PLAN: SLP FREQUENCY: 2x/week  SLP DURATION: 12 weeks  PLANNED INTERVENTIONS: Language facilitation, Cueing hierachy, Internal/external aids, Functional tasks, Multimodal communication approach, SLP instruction and feedback, Compensatory  strategies, and Patient/family education    Delon Bangs, M.S., CCC-SLP Speech-Language Pathologist [facility] [phone] [place])  [facility] Out[facility] Rehabilitation at [facility] [address] [place], [place], [address] Phone: [phone]   Fax:  [phone]

## 2023-12-11 NOTE — Therapy (Signed)
 OUTPATIENT OCCUPATIONAL THERAPY NEURO TREATMENT NOTE  Patient Name: Hannah Kirby MRN: [id number] DOB:1945/10/09, 78 y.o., female Today's Date: 12/11/2023  PCP: Dr. [name] Perry REFERRING PROVIDER: Dr. [name] Perry  END OF SESSION:  OT End of Session - 12/11/23 1514     Visit Number 7    Number of Visits 24    Date for Recertification  01/22/24    OT Start Time 1400    OT Stop Time 1445    OT Time Calculation (min) 45 min    Equipment Utilized During Treatment transport chair, RW    Activity Tolerance Patient tolerated treatment well    Behavior During Therapy WFL for tasks assessed/performed         Past Medical History:  Diagnosis Date   Acute hemorrhagic colitis 08/08/2019   Anemia    C. difficile colitis 08/09/2019   CAD (coronary artery disease)    a. 02/2006 PCI: BMS x 2 to RCA, cath o/w without significant coronary disease; b. nuclear stress test 07/2014: No ischemia/infarct; c. 11/2017 MV: no isch/infarct, EF 55-65%; d. 03/2020 NSTEMI/PCI: LM nl, LAD min irregs, RI 25, small, LCX nl, RCA 30p/m ISR, 99d (3.0x15 Resolute Onyx DES).   Cataract 02/04/2018   Cerebrovascular accident (CVA) (HCC) 03/18/2023   dysarthria   Chronic bronchitis (HCC)    secondary to cigarette smoking   FHx: allergies    Goiter    Granulomatous disease    Hernia    Kidney stone on left side 2013   NSTEMI (non-ST elevated myocardial infarction) (HCC) 03/23/2020   Oxygen  deficiency    Panic attacks    PVC's (premature ventricular contractions)    a. 03/2018 Zio: Occas PVCs (2.5%). Triggered events assoc w/ PVC/PAC.   Retinal detachment, left 03/04/2020   Retinal tear 2020   Stroke (HCC) 10/29/2016   mild left side weakness   Stroke (HCC) 08/01/2019   Tobacco abuse    Past Surgical History:  Procedure Laterality Date   ABDOMINAL HYSTERECTOMY     BREAST SURGERY     CATARACT EXTRACTION W/PHACO Right 03/01/2017   Procedure: CATARACT EXTRACTION PHACO AND INTRAOCULAR LENS PLACEMENT  (IOC) RIGHT DIABETIC;  Surgeon: Mittie Gaskin, MD;  Location: [facility];  Service: Ophthalmology;  Laterality: Right;   CATARACT EXTRACTION W/PHACO Left 03/22/2017   Procedure: CATARACT EXTRACTION PHACO AND INTRAOCULAR LENS PLACEMENT (IOC) LEFT DIABETIC;  Surgeon: Mittie Gaskin, MD;  Location: [facility];  Service: Ophthalmology;  Laterality: Left;  Diabetic - insulin  and oral meds   COLONOSCOPY WITH PROPOFOL  N/A 09/23/2014   Procedure: COLONOSCOPY WITH PROPOFOL ;  Surgeon: Gladis RAYMOND Mariner, MD;  Location: [facility] ENDOSCOPY;  Service: Endoscopy;  Laterality: N/A;   COLONOSCOPY WITH PROPOFOL  N/A 01/11/2018   Procedure: COLONOSCOPY WITH PROPOFOL ;  Surgeon: Mariner Gladis RAYMOND, MD;  Location: [facility];  Service: Endoscopy;  Laterality: N/A;   COLONOSCOPY WITH PROPOFOL  N/A 04/24/2018   Procedure: COLONOSCOPY WITH PROPOFOL ;  Surgeon: Mariner Gladis RAYMOND, MD;  Location: [facility] ENDOSCOPY;  Service: Endoscopy;  Laterality: N/A;   COLONOSCOPY WITH PROPOFOL  N/A 11/19/2019   Procedure: COLONOSCOPY WITH PROPOFOL ;  Surgeon: Jinny Carmine, MD;  Location: [facility] ENDOSCOPY;  Service: Endoscopy;  Laterality: N/A;   CORONARY ANGIOPLASTY WITH STENT PLACEMENT  2008   CORONARY STENT INTERVENTION N/A 03/24/2020   Procedure: CORONARY STENT INTERVENTION;  Surgeon: Mady Bruckner, MD;  Location: [facility] INVASIVE CV LAB;  Service: Cardiovascular;  Laterality: N/A;   ESOPHAGOGASTRODUODENOSCOPY (EGD) WITH PROPOFOL  N/A 12/30/2014   Procedure: ESOPHAGOGASTRODUODENOSCOPY (EGD) WITH PROPOFOL ;  Surgeon:  Gladis RAYMOND Mariner, MD;  Location: [facility] ENDOSCOPY;  Service: Endoscopy;  Laterality: N/A;   ESOPHAGOGASTRODUODENOSCOPY (EGD) WITH PROPOFOL  N/A 07/19/2016   Procedure: ESOPHAGOGASTRODUODENOSCOPY (EGD) WITH PROPOFOL ;  Surgeon: Mariner Gladis RAYMOND, MD;  Location: [facility] ENDOSCOPY;  Service: Endoscopy;  Laterality: N/A;   ESOPHAGOGASTRODUODENOSCOPY (EGD) WITH PROPOFOL  N/A 04/24/2018   Procedure:  ESOPHAGOGASTRODUODENOSCOPY (EGD) WITH PROPOFOL ;  Surgeon: Mariner Gladis RAYMOND, MD;  Location: [facility] ENDOSCOPY;  Service: Endoscopy;  Laterality: N/A;   EYE SURGERY  cataracts, detached retina   hysterectomy (other)     LEFT HEART CATH AND CORONARY ANGIOGRAPHY N/A 03/24/2020   Procedure: LEFT HEART CATH AND CORONARY ANGIOGRAPHY;  Surgeon: Mady Bruckner, MD;  Location: [facility] INVASIVE CV LAB;  Service: Cardiovascular;  Laterality: N/A;   Patient Active Problem List   Diagnosis Date Noted   Stroke (HCC) 08/05/2023   Depression with anxiety 08/05/2023   Overweight (BMI 25.0-29.9) 08/05/2023   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) 03/17/2023   Dizziness 02/23/2023   Restrictive airway disease 07/21/2020   Partial thickness rotator cuff tear 03/20/2020   Shoulder pain 03/05/2020   Ataxia 02/28/2020   Difficulty walking 02/28/2020   Physical deconditioning 02/28/2020   Chronic diastolic congestive heart failure (HCC) 11/20/2019   Polyp of ascending colon    Hypokalemia 08/08/2019   TIA (transient ischemic attack) 08/01/2019   Benign hypertensive kidney disease with chronic kidney disease 04/25/2019   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/24/2018   Chronic, continuous use of opioids 03/26/2018   History of adenomatous polyp of colon 01/22/2018   Diverticulosis of colon 01/22/2018   History of CVA (cerebrovascular accident) 11/08/2016   Weakness of left upper extremity 10/30/2016   AAA (abdominal aortic aneurysm) without rupture 08/12/2016   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   Stage 3b chronic kidney disease (HCC) 07/27/2015   Type 2 diabetes mellitus with diabetic chronic kidney disease (HCC) 10/28/2014   Solitary pulmonary nodule on lung CT 08/20/2014   Allergic rhinitis 07/28/2014   Acid reflux 07/28/2014   Chronic obstructive pulmonary disease (HCC) 07/28/2014   Granuloma annulare    Back pain 11/12/2013   Lumbar scoliosis 10/30/2013   Lumbar  radiculopathy 10/30/2013   Lumbar canal stenosis 10/30/2013   Proteinuria 10/22/2013   Smokes with greater than 30 pack year history 03/21/2011   Edema 08/18/2010   Hyperlipidemia 03/25/2009   Coronary artery disease 03/25/2009   Primary hypertension 03/25/2009   ONSET DATE: 08/05/2023  REFERRING DIAG: M70.101 (ICD-10-CM) - Weakness of left upper extremity   THERAPY DIAG:  Muscle weakness (generalized)  Other lack of coordination  Rationale for Evaluation and Treatment: Rehabilitation  SUBJECTIVE:  SUBJECTIVE STATEMENT: Pt returns today after about 2 weeks out d/t not feeling well with intermittent dizziness.  Daughter reports that pt did try a steroid but it was ineffective.  Daughter reports reaching out to PCP to inquire about whether pt's dizziness could be a side effect from one of her BP pills.  PCP wants to follow up with pt in person, but this is not yet scheduled.  Pt accompanied by: family member: daughter, Harrie  PERTINENT HISTORY: Daughter present and provided hx.  Daughter reports pt has had 4 strokes, beginning in 2018, 2019, January of 2025 and the most recent in June of 2025.  Pt had been participating in [facility] since the most recent CVA, and is now eager to transition to outpatient services where PT/OT/SLP have been ordered. Per MEDICAL RECORD NUMBERPMH: of CVA, COPD, Dysarthria, Ataxia, TIA, T2DM, Back pain,  scoliosis   PRECAUTIONS: Fall, Loop recorder for Afib  WEIGHT BEARING RESTRICTIONS: No  PAIN: 12/11/23: chronic back pain with prolonged standing 3/10 11/21/23: 3/10 chronic back pain 11/08/23: Same as below 11/02/23: same as eval below Are you having pain? Yes: NPRS scale: 3/10, can get up to 7-8/10 Pain location: low back and L hip Pain description: achy, sharp, dull Aggravating factors: prolonged standing  Relieving factors: sitting, pain meds, heat/ice   FALLS: Has patient fallen in last 6 months? Yes. Number of falls 1, tripping over cords  LIVING  ENVIRONMENT: Lives with: lives with their spouse Lives in: 1 level home Stairs: Yes: External: 1 steps; none Has following equipment at home: walk in shower, comfort height toilet   PLOF: Prior to Jan of this year, pt was managing all home management tasks, was able to drive, and was caring for spouse   PATIENT GOALS: Strengthening the left arm   OBJECTIVE:  Note: Objective measures were completed at Evaluation unless otherwise noted.  HAND DOMINANCE: Right  ADLs: Overall ADLs: Daughter is pt's primary caregiver and lives near by Transfers/ambulation related to ADLs: occasional help with using walker at home, depending on balance on a given day Eating: indep/able to cut food  Grooming: bimanual grooming without difficulty UB Dressing: extra time for clothing fasteners, but able to gather clothing with RW LB Dressing: extra time with clothing fasteners Toileting: occasional help to walk to the bathroom, but modified indep within the bathroom for toileting tasks Bathing: direct supv for bathing using walkin in shower, shower chair, and grab bar; gets hair washed weekly at salon Tub Shower transfers: supv for walk in shower Equipment: RW, SBQC, shower chair, grab bars  IADLs: Shopping: daughter currently managing Light housekeeping: daughter currently managing; can start laundry on a good day.  Meal Prep: starting to participate in light meal prep, including making eggs on stove top Community mobility: daughter assists with all transportation; pt using transport chair to reach therapy gym today Medication management: daughter sets up pills 2 weeks at a time  Financial management: pt is participating in bill paying with daughter's assistance; most are online Handwriting: 100% legible  POSTURE COMMENTS:  rounded shoulders, forward head, increased thoracic kyphosis  ACTIVITY TOLERANCE: Activity tolerance: to be further assessed within functional contexts  FUNCTIONAL OUTCOME  MEASURES: TBD  UPPER EXTREMITY ROM:  BUEs WFL for daily tasks  UPPER EXTREMITY MMT:  Hx of R rotator cuff tear 4-5 years ago with residual weakness, non-surgical;    MMT Right eval Left eval  Shoulder flexion 3+ 4  Shoulder abduction 3+ 4  Shoulder adduction    Shoulder extension    Shoulder internal rotation 3+ 4-  Shoulder external rotation 3+ 3+  Elbow flexion 5 5  Elbow extension 5 5  Wrist flexion 4+ 5  Wrist extension 4+ 5  Wrist ulnar deviation    Wrist radial deviation    Wrist pronation    Wrist supination    (Blank rows = not tested)   *Most recent CVA affecting L non-dominant side, with LUE presenting with increased strength as compared to dominant/unaffected arm.  Assume RUE weaker from hx of rotator cuff tear, or possibly previous CVAs.  Pt does present with mild LUE ataxia, and bilat hand weakness.  HAND FUNCTION: Grip strength: Right: 32 lbs; Left: 30 lbs, Lateral pinch: Right: 8 lbs, Left: 6 lbs, and 3 point pinch: Right: 7 lbs, Left: 6 lbs  COORDINATION: Finger Nose Finger test: LUE mild ataxia  9  Hole Peg test: Right: 29 sec; Left: 47 sec  SENSATION: Pt reports intermittent numbness in the R thumb, IF, and LF  EDEMA: No visible edema  MUSCLE TONE: RUE: Within functional limits and LUE: Within functional limits  COGNITION: Overall cognitive status: Impaired; see SLP eval for details  VISION: Pt reports L eye feels more blurry since first stroke in 2018; pt has reading glasses but doesn't usually wear them   VISION ASSESSMENT: To be further assessed in functional context Tracking/Visual pursuits: Able to track stimulus in all quads without difficulty Saccades: WFL Visual Fields: no apparent deficits Clock drawing completed: all numbers spaced evenly, but pt left out the 1 and verbalized not knowing how to draw ten minutes to eleven when this direction was given.  Patient has difficulty with following activities due to following visual  impairments: To be further assessed  PERCEPTION: To be further assessed  PRAXIS: Impaired: Motor planning  OBSERVATIONS: Pt pleasant, cooperative.  Daughter present and very supportive in pt's care.                                                                                                                       TREATMENT DATE:12/11/23 Self Care: -Review of ADL routines with pt/daughter since pt has missed a couple weeks of OT.  Daughter reports continued assist with laundry and meal prep d/t limited standing tolerance and standing balance. -Reviewed laundry strategies for item transport (rollator), using chair to place laundry basket on next to washing machine to prevent need for reaching to floor level (to prevent dizziness) when loading clothes from basket to dryer.  Recommended pt sit on chair when unloading dryer to avoid need for bending.  Recommended use of reacher to gather clothes from back of dryer or to pick up dropped clothing items.    Therapeutic Activity: -Facilitated standing tolerance to work towards increased indep with meal prep tasks, while facilitating R/L [facility] skills for daily tasks.  Pt stood 4.5 min x3 trials to sort bills and coins, predominantly using L hand, but R hand to assist the L on occasion.  PATIENT EDUCATION: Education details: ADL strategies Person educated: Patient and Child(ren) Education method: Explanation and Verbal cues Education comprehension: verbalized understanding  HOME EXERCISE PROGRAM: Yellow theraputty  GOALS: Goals reviewed with patient? Yes  SHORT TERM GOALS: Target date: 12/11/23  Pt will perform HEP with min vc or less for improving LUE coordination and bilat hand strength. Baseline: Eval: HEP not yet initiated Goal status: INITIAL  2.  Pt will be indep to verbalize and implement 2-3 fall prevention measures to reduce fall risk with ADLs.  Baseline: Eval: Educ not yet initiated  Goal status: INITIAL  LONG TERM GOALS: Target  date: 01/22/24  Pt will perform shower transfers with modified indep Baseline: Eval: Direct supv Goal status: INITIAL  2.  Pt will improve LUE GMC to enable reaching with good accuracy into overhead kitchen cabinets. Baseline: Eval: Mild LUE ataxia  Goal status: INITIAL  3.  Pt will improve L hand [facility] skills as demonstrated by 10 sec or more improvement in 9 hole peg test for improved efficiency when manipulating  clothing fasteners. Baseline: Eval: L 9 hole peg test: 47 sec, R 29 sec Goal status: INITIAL  4.  Pt will manage light loads of laundry with modified indep. Baseline: Eval: Daughter mostly manages, but pt can start laundry on a good day Goal status: INITIAL  ASSESSMENT:  CLINICAL IMPRESSION: Standing tolerance 4.5 min x3 trials today, limited by back pain and very mild dyspnea.  Pt was able to standing and participate in L/R [employer] activity, but struggled to add conversation into this task, which was purposefully given to increase challenge to simulate multitasking required with meal prep activities.  Pt reported 7-8 on RPE scale for standing trials.  Pt/daughter receptive to IADL strategies reviewed today to increase indep and safety with laundry tasks.  Encouraged pt attempt these strategies over the next week or 2 and will then discuss performance at follow up OT sessions.  Daughter reports that she has started back to work, leaving pt's spouse to be more involved in pt's care.  Daughter is concerned that pt isn't eating enough, even though daughter brings food over regularly.  Daughter reports pt is still lacking balance and stamina to manage most meal prep or to reheat food.  Will continue to focus on increasing standing balance and tolerance for continued progression with IADLs.  Pt continues to benefit from skilled OT to address above noted deficits while working to maximize pt's level of indep with ADL/IADL tasks in order to reduce burden of care on daughter and pt's spouse, and  to improve QOL.     PERFORMANCE DEFICITS: in functional skills including ADLs, IADLs, coordination, dexterity, sensation, ROM, strength, pain, Fine motor control, Gross motor control, mobility, balance, body mechanics, endurance, decreased knowledge of precautions, decreased knowledge of use of DME, vision, and UE functional use, cognitive skills including emotional, perception, problem solving, safety awareness, temperament/personality, and understand, and psychosocial skills including coping strategies, environmental adaptation, and routines and behaviors.   IMPAIRMENTS: are limiting patient from ADLs, IADLs, leisure, and social participation.   CO-MORBIDITIES: has co-morbidities such as afib, AAA, hx of multiple CVAs that affects occupational performance. Patient will benefit from skilled OT to address above impairments and improve overall function.  MODIFICATION OR ASSISTANCE TO COMPLETE EVALUATION: Min-Moderate modification of tasks or assist with assess necessary to complete an evaluation.  OT OCCUPATIONAL PROFILE AND HISTORY: Detailed assessment: Review of records and additional review of physical, cognitive, psychosocial history related to current functional performance.  CLINICAL DECISION MAKING: Moderate - several treatment options, min-mod task modification necessary  REHAB POTENTIAL: Good  EVALUATION COMPLEXITY: Moderate    PLAN:  OT FREQUENCY: 2x/week  OT DURATION: 12 weeks  PLANNED INTERVENTIONS: 97168 OT Re-evaluation, 97535 self care/ADL training, 02889 therapeutic exercise, 97530 therapeutic activity, 97112 neuromuscular re-education, 97140 manual therapy, 97116 gait training, 02989 moist heat, 97010 cryotherapy, 97129 Cognitive training (first 15 min), 02249 Physical Performance Testing, balance training, functional mobility training, visual/perceptual remediation/compensation, psychosocial skills training, energy conservation, coping strategies training, patient/family  education, and DME and/or AE instructions  RECOMMENDED OTHER SERVICES: none at this time (OT/PT/SLP services have all been ordered)  CONSULTED AND AGREED WITH PLAN OF CARE: Patient and family member/caregiver  PLAN FOR NEXT SESSION: see above  Inocente Blazing, MS, OTR/L   12/11/2023, 3:16 PM

## 2023-12-11 NOTE — Therapy (Signed)
 OUTPATIENT PHYSICAL THERAPY NEURO TREATMENT   Patient Name: Hannah Kirby MRN: [id number] DOB:[birth year], 78 y.o., female Today's Date: 12/11/2023   PCP: Gasper [name] BRAVO, MD REFERRING PROVIDER: Gasper [name] BRAVO, MD  END OF SESSION:    PT End of Session - 12/11/23 1455     Visit Number 11    Number of Visits 25    Date for Recertification  01/09/24    Progress Note Due on Visit 10    PT Start Time 1446    PT Stop Time 1530    PT Time Calculation (min) 44 min    Equipment Utilized During Treatment Gait belt    Activity Tolerance Patient tolerated treatment well    Behavior During Therapy WFL for tasks assessed/performed          Past Medical History:  Diagnosis Date   Acute hemorrhagic colitis 08/08/2019   Anemia    C. difficile colitis 08/09/2019   CAD (coronary artery disease)    a. 02/2006 PCI: BMS x 2 to RCA, cath o/w without significant coronary disease; b. nuclear stress test 07/2014: No ischemia/infarct; c. 11/2017 MV: no isch/infarct, EF 55-65%; d. 03/2020 NSTEMI/PCI: LM nl, LAD min irregs, RI 25, small, LCX nl, RCA 30p/m ISR, 99d (3.0x15 Resolute Onyx DES).   Cataract 02/04/2018   Cerebrovascular accident (CVA) (HCC) 03/18/2023   dysarthria   Chronic bronchitis (HCC)    secondary to cigarette smoking   FHx: allergies    Goiter    Granulomatous disease    Hernia    Kidney stone on left side 2013   NSTEMI (non-ST elevated myocardial infarction) (HCC) 03/23/2020   Oxygen  deficiency    Panic attacks    PVC's (premature ventricular contractions)    a. 03/2018 Zio: Occas PVCs (2.5%). Triggered events assoc w/ PVC/PAC.   Retinal detachment, left 03/04/2020   Retinal tear 2020   Stroke (HCC) 10/29/2016   mild left side weakness   Stroke (HCC) 08/01/2019   Tobacco abuse    Past Surgical History:  Procedure Laterality Date   ABDOMINAL HYSTERECTOMY     BREAST SURGERY     CATARACT EXTRACTION W/PHACO Right 03/01/2017   Procedure: CATARACT EXTRACTION PHACO AND  INTRAOCULAR LENS PLACEMENT (IOC) RIGHT DIABETIC;  Surgeon: Mittie Gaskin, MD;  Location: [facility];  Service: Ophthalmology;  Laterality: Right;   CATARACT EXTRACTION W/PHACO Left 03/22/2017   Procedure: CATARACT EXTRACTION PHACO AND INTRAOCULAR LENS PLACEMENT (IOC) LEFT DIABETIC;  Surgeon: Mittie Gaskin, MD;  Location: [facility];  Service: Ophthalmology;  Laterality: Left;  Diabetic - insulin  and oral meds   COLONOSCOPY WITH PROPOFOL  N/A 09/23/2014   Procedure: COLONOSCOPY WITH PROPOFOL ;  Surgeon: Gladis RAYMOND Mariner, MD;  Location: [facility] ENDOSCOPY;  Service: Endoscopy;  Laterality: N/A;   COLONOSCOPY WITH PROPOFOL  N/A 01/11/2018   Procedure: COLONOSCOPY WITH PROPOFOL ;  Surgeon: Mariner Gladis RAYMOND, MD;  Location: [facility] ENDOSCOPY;  Service: Endoscopy;  Laterality: N/A;   COLONOSCOPY WITH PROPOFOL  N/A 04/24/2018   Procedure: COLONOSCOPY WITH PROPOFOL ;  Surgeon: Mariner Gladis RAYMOND, MD;  Location: [facility] ENDOSCOPY;  Service: Endoscopy;  Laterality: N/A;   COLONOSCOPY WITH PROPOFOL  N/A 11/19/2019   Procedure: COLONOSCOPY WITH PROPOFOL ;  Surgeon: Jinny Carmine, MD;  Location: [facility] ENDOSCOPY;  Service: Endoscopy;  Laterality: N/A;   CORONARY ANGIOPLASTY WITH STENT PLACEMENT  2008   CORONARY STENT INTERVENTION N/A 03/24/2020   Procedure: CORONARY STENT INTERVENTION;  Surgeon: Mady Bruckner, MD;  Location: [facility] INVASIVE CV LAB;  Service: Cardiovascular;  Laterality: N/A;   ESOPHAGOGASTRODUODENOSCOPY (  EGD) WITH PROPOFOL  N/A 12/30/2014   Procedure: ESOPHAGOGASTRODUODENOSCOPY (EGD) WITH PROPOFOL ;  Surgeon: Gladis RAYMOND Mariner, MD;  Location: [facility] ENDOSCOPY;  Service: Endoscopy;  Laterality: N/A;   ESOPHAGOGASTRODUODENOSCOPY (EGD) WITH PROPOFOL  N/A 07/19/2016   Procedure: ESOPHAGOGASTRODUODENOSCOPY (EGD) WITH PROPOFOL ;  Surgeon: Mariner Gladis RAYMOND, MD;  Location: [facility] ENDOSCOPY;  Service: Endoscopy;  Laterality: N/A;   ESOPHAGOGASTRODUODENOSCOPY (EGD) WITH PROPOFOL  N/A 04/24/2018    Procedure: ESOPHAGOGASTRODUODENOSCOPY (EGD) WITH PROPOFOL ;  Surgeon: Mariner Gladis RAYMOND, MD;  Location: [facility] ENDOSCOPY;  Service: Endoscopy;  Laterality: N/A;   EYE SURGERY  cataracts, detached retina   hysterectomy (other)     LEFT HEART CATH AND CORONARY ANGIOGRAPHY N/A 03/24/2020   Procedure: LEFT HEART CATH AND CORONARY ANGIOGRAPHY;  Surgeon: Mady Bruckner, MD;  Location: [facility] INVASIVE CV LAB;  Service: Cardiovascular;  Laterality: N/A;   Patient Active Problem List   Diagnosis Date Noted   Stroke (HCC) 08/05/2023   Depression with anxiety 08/05/2023   Overweight (BMI 25.0-29.9) 08/05/2023   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) 03/17/2023   Dizziness 02/23/2023   Restrictive airway disease 07/21/2020   Partial thickness rotator cuff tear 03/20/2020   Shoulder pain 03/05/2020   Ataxia 02/28/2020   Difficulty walking 02/28/2020   Physical deconditioning 02/28/2020   Chronic diastolic congestive heart failure (HCC) 11/20/2019   Polyp of ascending colon    Hypokalemia 08/08/2019   TIA (transient ischemic attack) 08/01/2019   Benign hypertensive kidney disease with chronic kidney disease 04/25/2019   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/24/2018   Chronic, continuous use of opioids 03/26/2018   History of adenomatous polyp of colon 01/22/2018   Diverticulosis of colon 01/22/2018   History of CVA (cerebrovascular accident) 11/08/2016   Weakness of left upper extremity 10/30/2016   AAA (abdominal aortic aneurysm) without rupture 08/12/2016   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   Stage 3b chronic kidney disease (HCC) 07/27/2015   Type 2 diabetes mellitus with diabetic chronic kidney disease (HCC) 10/28/2014   Solitary pulmonary nodule on lung CT 08/20/2014   Allergic rhinitis 07/28/2014   Acid reflux 07/28/2014   Chronic obstructive pulmonary disease (HCC) 07/28/2014   Granuloma annulare    Back pain 11/12/2013   Lumbar scoliosis 10/30/2013    Lumbar radiculopathy 10/30/2013   Lumbar canal stenosis 10/30/2013   Proteinuria 10/22/2013   Smokes with greater than 30 pack year history 03/21/2011   Edema 08/18/2010   Hyperlipidemia 03/25/2009   Coronary artery disease 03/25/2009   Primary hypertension 03/25/2009    ONSET DATE: 08/05/23  REFERRING DIAG:  Z86.73 (ICD-10-CM) - History of CVA (cerebrovascular accident)  R27.0 (ICD-10-CM) - Ataxia    THERAPY DIAG:   Muscle weakness (generalized)  Other lack of coordination  Aphasia  Apraxia of speech  Unsteadiness on feet  Dizziness and giddiness  Abnormality of gait and mobility  Difficulty in walking, not elsewhere classified  Other abnormalities of gait and mobility  Rationale for Evaluation and Treatment: Rehabilitation  SUBJECTIVE:  SUBJECTIVE STATEMENT:  Pt reports no new complaints upon arrival, just that she has been still having issues with the dizziness that was present earlier in sessions.  Pt reports that it has not been resolved since that time.    Pt accompanied by: self and family member, daughter  PERTINENT HISTORY:  PMH: of CVA, COPD, Dysarthria, Ataxia, TIA, T2DM, Back pain, scoliosis   PAIN:  Are you having pain? Yes: NPRS scale: 3/10 Pain location: low back and L hip Pain description: achy Aggravating factors: just hurts all the time. Relieving factors: hydrocodone  at night  PRECAUTIONS: Other: Pt has a looper to see if pt has a-fib  RED FLAGS: Abdominal aneurysm: Yes: pt reports having an abdominal aneurism as found by Dr. Marea.   WEIGHT BEARING RESTRICTIONS: No  FALLS: Has patient fallen in last 6 months? Yes. Number of falls 1.  Pt reports losing balance, and got caught up in cords that were on the floor when she was trying to close the  blinds.  LIVING ENVIRONMENT: Lives with: lives with their spouse Lives in: House/apartment Stairs: Yes: External: 1 steps; none Has following equipment at home: Single point cane, Walker - 2 wheeled, Environmental consultant - 4 wheeled, shower chair, and Grab bars  PLOF: Independent  PATIENT GOALS: Pt is wanting to gain independence.  Pt is wanting to improve endurance levels and improving speech with SLP.  OBJECTIVE:  Note: Objective measures were completed at Evaluation unless otherwise noted.  DIAGNOSTIC FINDINGS:   EXAM: MRI HEAD WITHOUT CONTRAST   MRA HEAD WITHOUT CONTRAST   MRA NECK WITHOUT AND WITH CONTRAST   IMPRESSION: 1. Small acute infarcts in the left frontal lobe, right parietal lobe, and left cerebellum. 2. Extensive chronic small vessel ischemic disease. 3. 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 4. Unchanged severe right A3 stenosis or segmental occlusion. 5. Moderate left P2 stenosis. 6. Limited assessment of the left vertebral artery origin, otherwise negative neck MRA.   COGNITION: Overall cognitive status: Within functional limits for tasks assessed   SENSATION: WFL, however pt notes having some numbness in the R hand.  COORDINATION: WFL  POSTURE: rounded shoulders, forward head, decreased lumbar lordosis, increased thoracic kyphosis, posterior pelvic tilt, and flexed trunk    LOWER EXTREMITY MMT:    MMT Right Eval Left Eval  Hip flexion 4 4  Hip extension    Hip abduction 4 4-  Hip adduction 4 4-  Hip internal rotation    Hip external rotation    Knee flexion 4 4-  Knee extension 4 4-  Ankle dorsiflexion 4 4-  Ankle plantarflexion    Ankle inversion    Ankle eversion    (Blank rows = not tested)  BED MOBILITY:  Not tested  FUNCTIONAL TESTS:  5 times sit to stand: 24.36 sec Timed up and go (TUG): 23.94 sec with walker 2 minute walk test: TBD 10 meter walk test: 20.33 sec with walker Berg Balance Scale: TBD  PATIENT SURVEYS:    Stroke Impact Scale 16 (Copyrighted instrument, [facility])  In the past 2 weeks, how difficult was it to...  Rating Scale 5 = Not difficult at all 4 = A little difficult 3 = Somewhat difficult 2 = Very difficult 1 = Could not do at all  a. Dress the top part of your body? 4  b. Bathe yourself? 2  c. Get to the toilet on time? 5  d. Control your bladder (not have an accident)? 5  e. Control your bowels (not have an accident)? 5  f. Stand without losing balance? 3  g. Go shopping? 2  h. Do heavy household chores (e.g. vacuum, laundry or  yard work)? 1  i. Stay sitting without losing your balance? 3  j. Walk without losing your balance? 2  k. Move from a bed to a chair? 3  l. Walk fast? 1  m. Climb one flight of stairs? 1  n. Walk one block? 1  o. Get in and out of a car? 2  p. Carry heavy objects (e.g. bag of groceries) with your  affected hand? 1  Sum:  41/80  MDC (Minimal Detectable Change) is >/=8                                                                                                                                 TREATMENT DATE: 12/11/23   Pt arrived to session in transport chair, daughter present.   SpO2: 89-92% upon arrival to the clinic with room air; seated.  SpO2: 94% on 2L of O2  TherAct: To improve functional movements patterns for everyday tasks  Circuit Training  Attempt #1: Ambulation around the gym with use of rollator, 150', on supplemental 2L of O2, >95% after mobilizing  Standing lateral transfer of mini basketballs into pillowcase held by therapist to simulate laundry while in standing after walk, x2  Attempt #2: Ambulation around the gym with use of rollator, 150', on supplemental 2L of O2, >95% after mobilizing  Standing lateral transfer of mini basketballs into pillowcase held by therapist to simulate laundry while in standing after walk, x2   TherEx: To improve strength, endurance, mobility, and  function of specific targeted muscle groups or improve joint range of motion or improve muscle flexibility  Seated hamstring curls with GTB, 2x15 each LE Seated resisted hip abduction with GTB, 2x15 each side Seated marches with GTB resistance around knees, 2x15 each LE Seated hip adduction with rainbow physioball, 3 sec holds, 2x15        PATIENT EDUCATION: Education details: Pt educated on role of PT and services provided during current POC, along with prognosis and information about the clinic.  Person educated: Patient and Child(ren) Education method: Explanation Education comprehension: verbalized understanding and returned demonstration  HOME EXERCISE PROGRAM:  Access Code: [email or web address] URL: [email or web address] Date: 10/17/2023 Prepared by: Sidra Simpers  Exercises - Standing March with Counter Support  - 1 x daily - 3-4 x weekly - 3 sets - 10 reps - Standing Knee Flexion with Unilateral Counter Support  - 1 x daily - 7 x weekly - 3 sets - 10 reps - Standing Hip Extension with Unilateral Counter Support  - 1 x daily - 7 x weekly - 3 sets - 10 reps - Standing Hip Abduction with Unilateral Counter Support  - 1 x daily - 7 x weekly - 3 sets - 10 reps - Heel Toe Raises with Unilateral Counter  Support  - 1 x daily - 7 x weekly - 3 sets - 10 reps  GOALS: Goals reviewed with patient? Yes  SHORT TERM GOALS: Target date: 11/15/2023  Pt will be independent with HEP in order to demonstrate increased ability to perform tasks related to occupation/hobbies. Baseline: pt given HEP at eval Goal status: INITIAL  LONG TERM GOALS: Target date: 01/10/2024  1.  Patient (> 71 years old) will complete five times sit to stand test in < 15 seconds indicating an increased LE strength and improved balance. Baseline: 24.36 sec Goal status: INITIAL  2.  Patient will improve SIS 16 score to 55   to demonstrate statistically significant improvement in mobility and quality of life as  it relates to their CVA.  Baseline: 41 Goal status: INITIAL   3.  Patient will increase Berg Balance score by > 6 points to demonstrate decreased fall risk during functional activities. Baseline: 33/56 Goal status: INITIAL   4.  Patient will reduce timed up and go to <11 seconds to reduce fall risk and demonstrate improved transfer/gait ability. Baseline: 23.94 sec with walker Goal status: INITIAL  5.  Patient will increase 10 meter walk test to >1.72m/s as to improve gait speed for better community ambulation and to reduce fall risk. Baseline: 20.33 sec with walker Goal status: INITIAL  6.  Patient will increase 2 minute walk test distance by 50 ft or greater for progression to community ambulator and demonstrate improved gait ability  Baseline: 275'; 83.82 m Goal status: INITIAL     ASSESSMENT:  CLINICAL IMPRESSION:  Pt responded well to the ambulation attempts and at the request of OT, assessment of pt able to perform ambulation with rollator, while utilizing the seat to transfer laundry.  Basket filled with miniature basketballs was utilized to simulate laundry with pt pulling out of the basket and placing/throwing in a pillowcase held by therapist to simulate transfer into washer/dryer.  Pt fatigued during the session and can tell that she has not regained her endurance, which will continue to be a focal point moving forward.   Pt will continue to benefit from skilled therapy to address remaining deficits in order to improve overall QoL and return to PLOF.        OBJECTIVE IMPAIRMENTS: Abnormal gait, decreased activity tolerance, decreased balance, decreased endurance, decreased knowledge of use of DME, decreased mobility, difficulty walking, decreased strength, impaired sensation, and pain.   ACTIVITY LIMITATIONS: carrying, lifting, bending, standing, squatting, stairs, transfers, bathing, toileting, dressing, hygiene/grooming, and locomotion level  PARTICIPATION  LIMITATIONS: meal prep, cleaning, laundry, community activity, and yard work  PERSONAL FACTORS: Age and 3+ comorbidities:  PMH: of CVA, COPD, Dysarthria, Ataxia, TIA, T2DM, Back pain, scoliosis are also affecting patient's functional outcome.   REHAB POTENTIAL: Good  CLINICAL DECISION MAKING: Evolving/moderate complexity  EVALUATION COMPLEXITY: Moderate  PLAN:  PT FREQUENCY: 2x/week  PT DURATION: 12 weeks  PLANNED INTERVENTIONS: 97750- Physical Performance Testing, 97110-Therapeutic exercises, 97530- Therapeutic activity, 97112- Neuromuscular re-education, 97535- Self Care, [id number]- Manual therapy, [phone]- Gait training, Balance training, and Vestibular training  PLAN FOR NEXT SESSION:   Monitor SPO2 and get portable oxygen  if necessary  Endurance, balance, strength ( L>R), updated HEP, gait training  -Limiting seated rest breaks as tolerated to challenge endurance   Fonda Simpers, PT, DPT Physical Therapist - [facility]  12/11/23, 5:53 PM

## 2023-12-12 ENCOUNTER — Ambulatory Visit

## 2023-12-12 ENCOUNTER — Encounter

## 2023-12-12 ENCOUNTER — Ambulatory Visit: Admitting: Physical Therapy

## 2023-12-14 ENCOUNTER — Ambulatory Visit: Admitting: Occupational Therapy

## 2023-12-14 ENCOUNTER — Ambulatory Visit

## 2023-12-14 DIAGNOSIS — R41841 Cognitive communication deficit: Secondary | ICD-10-CM

## 2023-12-14 DIAGNOSIS — M6281 Muscle weakness (generalized): Secondary | ICD-10-CM | POA: Diagnosis not present

## 2023-12-14 DIAGNOSIS — R269 Unspecified abnormalities of gait and mobility: Secondary | ICD-10-CM

## 2023-12-14 DIAGNOSIS — R2681 Unsteadiness on feet: Secondary | ICD-10-CM

## 2023-12-14 DIAGNOSIS — R262 Difficulty in walking, not elsewhere classified: Secondary | ICD-10-CM

## 2023-12-14 DIAGNOSIS — R278 Other lack of coordination: Secondary | ICD-10-CM | POA: Diagnosis not present

## 2023-12-14 DIAGNOSIS — I69890 Apraxia following other cerebrovascular disease: Secondary | ICD-10-CM

## 2023-12-14 DIAGNOSIS — R471 Dysarthria and anarthria: Secondary | ICD-10-CM

## 2023-12-14 DIAGNOSIS — R2689 Other abnormalities of gait and mobility: Secondary | ICD-10-CM

## 2023-12-14 DIAGNOSIS — R482 Apraxia: Secondary | ICD-10-CM

## 2023-12-14 DIAGNOSIS — R4701 Aphasia: Secondary | ICD-10-CM

## 2023-12-14 DIAGNOSIS — R42 Dizziness and giddiness: Secondary | ICD-10-CM | POA: Diagnosis not present

## 2023-12-14 NOTE — Therapy (Signed)
 OUTPATIENT SPEECH LANGUAGE PATHOLOGY APHASIA TREATMENT   Patient Name: Hannah Kirby MRN: [id number] DOB:1946/02/21, 78 y.o., female Today's Date: 12/14/2023  PCP: [name] Perry, MD REFERRING PROVIDER: [name] Perry, MD   End of Session - 12/14/23 1621     Visit Number 12    Number of Visits 23    Date for Recertification  01/09/24    Progress Note Due on Visit 20    SLP Start Time 1530    SLP Stop Time  1615    SLP Time Calculation (min) 45 min    Activity Tolerance Patient tolerated treatment well          Past Medical History:  Diagnosis Date   Acute hemorrhagic colitis 08/08/2019   Anemia    C. difficile colitis 08/09/2019   CAD (coronary artery disease)    a. 02/2006 PCI: BMS x 2 to RCA, cath o/w without significant coronary disease; b. nuclear stress test 07/2014: No ischemia/infarct; c. 11/2017 MV: no isch/infarct, EF 55-65%; d. 03/2020 NSTEMI/PCI: LM nl, LAD min irregs, RI 25, small, LCX nl, RCA 30p/m ISR, 99d (3.0x15 Resolute Onyx DES).   Cataract 02/04/2018   Cerebrovascular accident (CVA) (HCC) 03/18/2023   dysarthria   Chronic bronchitis (HCC)    secondary to cigarette smoking   FHx: allergies    Goiter    Granulomatous disease    Hernia    Kidney stone on left side 2013   NSTEMI (non-ST elevated myocardial infarction) (HCC) 03/23/2020   Oxygen  deficiency    Panic attacks    PVC's (premature ventricular contractions)    a. 03/2018 Zio: Occas PVCs (2.5%). Triggered events assoc w/ PVC/PAC.   Retinal detachment, left 03/04/2020   Retinal tear 2020   Stroke (HCC) 10/29/2016   mild left side weakness   Stroke (HCC) 08/01/2019   Tobacco abuse    Past Surgical History:  Procedure Laterality Date   ABDOMINAL HYSTERECTOMY     BREAST SURGERY     CATARACT EXTRACTION W/PHACO Right 03/01/2017   Procedure: CATARACT EXTRACTION PHACO AND INTRAOCULAR LENS PLACEMENT (IOC) RIGHT DIABETIC;  Surgeon: Mittie Gaskin, MD;  Location: [facility];   Service: Ophthalmology;  Laterality: Right;   CATARACT EXTRACTION W/PHACO Left 03/22/2017   Procedure: CATARACT EXTRACTION PHACO AND INTRAOCULAR LENS PLACEMENT (IOC) LEFT DIABETIC;  Surgeon: Mittie Gaskin, MD;  Location: [facility];  Service: Ophthalmology;  Laterality: Left;  Diabetic - insulin  and oral meds   COLONOSCOPY WITH PROPOFOL  N/A 09/23/2014   Procedure: COLONOSCOPY WITH PROPOFOL ;  Surgeon: Gladis RAYMOND Mariner, MD;  Location: [facility] ENDOSCOPY;  Service: Endoscopy;  Laterality: N/A;   COLONOSCOPY WITH PROPOFOL  N/A 01/11/2018   Procedure: COLONOSCOPY WITH PROPOFOL ;  Surgeon: Mariner Gladis RAYMOND, MD;  Location: [facility] ENDOSCOPY;  Service: Endoscopy;  Laterality: N/A;   COLONOSCOPY WITH PROPOFOL  N/A 04/24/2018   Procedure: COLONOSCOPY WITH PROPOFOL ;  Surgeon: Mariner Gladis RAYMOND, MD;  Location: [facility] ENDOSCOPY;  Service: Endoscopy;  Laterality: N/A;   COLONOSCOPY WITH PROPOFOL  N/A 11/19/2019   Procedure: COLONOSCOPY WITH PROPOFOL ;  Surgeon: Jinny Carmine, MD;  Location: [facility] ENDOSCOPY;  Service: Endoscopy;  Laterality: N/A;   CORONARY ANGIOPLASTY WITH STENT PLACEMENT  2008   CORONARY STENT INTERVENTION N/A 03/24/2020   Procedure: CORONARY STENT INTERVENTION;  Surgeon: Mady Bruckner, MD;  Location: [facility] INVASIVE CV LAB;  Service: Cardiovascular;  Laterality: N/A;   ESOPHAGOGASTRODUODENOSCOPY (EGD) WITH PROPOFOL  N/A 12/30/2014   Procedure: ESOPHAGOGASTRODUODENOSCOPY (EGD) WITH PROPOFOL ;  Surgeon: Gladis RAYMOND Mariner, MD;  Location: [facility] ENDOSCOPY;  Service: Endoscopy;  Laterality: N/A;   ESOPHAGOGASTRODUODENOSCOPY (EGD) WITH PROPOFOL  N/A 07/19/2016   Procedure: ESOPHAGOGASTRODUODENOSCOPY (EGD) WITH PROPOFOL ;  Surgeon: Gaylyn Gladis PENNER, MD;  Location: [facility] ENDOSCOPY;  Service: Endoscopy;  Laterality: N/A;   ESOPHAGOGASTRODUODENOSCOPY (EGD) WITH PROPOFOL  N/A 04/24/2018   Procedure: ESOPHAGOGASTRODUODENOSCOPY (EGD) WITH PROPOFOL ;  Surgeon: Gaylyn Gladis PENNER, MD;  Location: [facility]  ENDOSCOPY;  Service: Endoscopy;  Laterality: N/A;   EYE SURGERY  cataracts, detached retina   hysterectomy (other)     LEFT HEART CATH AND CORONARY ANGIOGRAPHY N/A 03/24/2020   Procedure: LEFT HEART CATH AND CORONARY ANGIOGRAPHY;  Surgeon: Mady Bruckner, MD;  Location: [facility] INVASIVE CV LAB;  Service: Cardiovascular;  Laterality: N/A;   Patient Active Problem List   Diagnosis Date Noted   Stroke (HCC) 08/05/2023   Depression with anxiety 08/05/2023   Overweight (BMI 25.0-29.9) 08/05/2023   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) 03/17/2023   Dizziness 02/23/2023   Restrictive airway disease 07/21/2020   Partial thickness rotator cuff tear 03/20/2020   Shoulder pain 03/05/2020   Ataxia 02/28/2020   Difficulty walking 02/28/2020   Physical deconditioning 02/28/2020   Chronic diastolic congestive heart failure (HCC) 11/20/2019   Polyp of ascending colon    Hypokalemia 08/08/2019   TIA (transient ischemic attack) 08/01/2019   Benign hypertensive kidney disease with chronic kidney disease 04/25/2019   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/24/2018   Chronic, continuous use of opioids 03/26/2018   History of adenomatous polyp of colon 01/22/2018   Diverticulosis of colon 01/22/2018   History of CVA (cerebrovascular accident) 11/08/2016   Weakness of left upper extremity 10/30/2016   AAA (abdominal aortic aneurysm) without rupture 08/12/2016   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   Stage 3b chronic kidney disease (HCC) 07/27/2015   Type 2 diabetes mellitus with diabetic chronic kidney disease (HCC) 10/28/2014   Solitary pulmonary nodule on lung CT 08/20/2014   Allergic rhinitis 07/28/2014   Acid reflux 07/28/2014   Chronic obstructive pulmonary disease (HCC) 07/28/2014   Granuloma annulare    Back pain 11/12/2013   Lumbar scoliosis 10/30/2013   Lumbar radiculopathy 10/30/2013   Lumbar canal stenosis 10/30/2013   Proteinuria 10/22/2013   Smokes  with greater than 30 pack year history 03/21/2011   Edema 08/18/2010   Hyperlipidemia 03/25/2009   Coronary artery disease 03/25/2009   Primary hypertension 03/25/2009    ONSET DATE: 03/17/23 (admitted with stroke), 05/23/23 (referral date)  REFERRING DIAG:  R41.841 (ICD-10-CM) - Cognitive communication deficit  I63.9 (ICD-10-CM) - CVA (cerebral vascular accident) (HCC)  R47.02 (ICD-10-CM) - Dysphasia    THERAPY DIAG:  Aphasia  Apraxia of speech  Cognitive communication deficit  Rationale for Evaluation and Treatment Rehabilitation  SUBJECTIVE:   SUBJECTIVE STATEMENT: Pt alert, pleasant, and cooperative.  Pt accompanied by: self and family member  PERTINENT HISTORY:  Pt is a 78 y.o. female who presents for communication evaluation in setting on stroke. Pt in ED 1/24-1/25/25. Pt d/c'd home with HH ST. PMHx significant of   HTN, CKD stage IIIB, DMT2 on insulin , CAD, chronic smoking, chronic respiratory failure with COPD and on home oxygen , anxiety, HLD. MRI 03/17/23 1. Punctate foci of restricted diffusion in the left posterior  frontal white matter, most likely acute infarcts.  2. Numerous foci of hemosiderin deposition, which are seen in the  deep gray structures but also in the bilateral cerebral hemispheres,  with 1 new focus compared to 02/20/2023. While this could be the  sequela of chronic hypertensive microhemorrhages, this appearance is  concerning  for cerebral amyloid angiopathy.   PAIN:  Are you having pain? No  FALLS: defer to PT  LIVING ENVIRONMENT: Lives with: lives with their spouse Lives in: House/apartment  PLOF:  Level of assistance: Independent with ADLs Employment: Retired   PATIENT GOALS    for communication to improve   OBJECTIVE:  TODAY'S TREATMENT:   Pt identified difficulty utilizing microwave as a source of frustration. Using photo of microwave and written cues, pt able to sequence numbers for whole minutes as well as whole minutes  + 30s with min cues.  Pt repeated 5 word phrases with 80% accuracy with mod/max cueing.   Pt generated x3 words per concrete category with min/mod cues.   Mildly improved verbal fluency during informal conversational exchanges this date.     PATIENT EDUCATION: Education details: as above Person educated: Patient and Child Education method: Explanation Education comprehension: verbalized understanding and needs further education  HOME EXERCISE PROGRAM:    SGD home practice worksheet    GOALS:  Goals reviewed with patient? Yes  SHORT TERM GOALS: Target date: 10 sessions  Pt will participate in further assessment of functional reading/writing. Baseline: Goal status: MET  2.  With Moderate A, patient will complete a semantic feature analysis with at least 2 relevant features for 8/10 target words to improve word-finding skills.  Baseline:  Goal status: PROGRESSING  3.  With Maximal A, patient will generate sentences with 3 or more words in response to a situation at 80% accuracy in order to increase ability to communicate basic wants and needs.  Baseline:  Goal status: PROGRESSING  4.   With Maximal A,  pt will follow 2-step commands for improved participation in ADLs/iADLs with max cueing. Baseline:  Goal status: INITIAL  5.  Pt will repeat short sentences (<4 words) with good approximations 80% of the time with max cueing. Baseline:  Goal status: INITIAL    LONG TERM GOALS: Target date: 12 weeks  Pt will report a subjective improvement in communication per PROM.  Baseline: CES 12/32 on 8/26 Goal status: INITIAL  2.  With Min A, patient/family will demonstrate understanding of the following concepts: aphasia, spontaneous recovery, communication vs conversation, strengths/strategies to promote success, local resources in order to increase patient's participation in medical care.    Baseline:  Goal status: INITIAL   ASSESSMENT:  CLINICAL IMPRESSION: Pt is a  78 y.o. female who presents for communication treatment in setting on stroke. Pt known to SLP services and was receiving OP ST prior to most recent stroke 08/05/23. Pt d/c'd home with [facility] ST who is working on securing SGD for pt. PMHx significant of  HTN, CKD stage IIIB, DMT2 on insulin , CAD, chronic smoking, chronic respiratory failure with COPD and on home oxygen , anxiety, HLD. Assessment this episode completed via functional/dynamic means including Western Aphasia Battery Revised (WAB-R) and PROM (Communication Effectiveness Scale). 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 See details of tx session above. Recommend course of ST targeting functional communication, further assessment of functional reading/writing, and pt/caregiver training to help promote QoL and overall life participation.   OBJECTIVE IMPAIRMENTS include expressive language, receptive language, and aphasia. These impairments are limiting patient from ADLs/IADLs and effectively communicating at home and in community. Factors affecting potential to achieve goals and functional outcome are severity of impairments. Patient will benefit from skilled SLP services to address above impairments and improve overall function.  REHAB POTENTIAL: Good  PLAN: SLP FREQUENCY: 2x/week  SLP DURATION: 12 weeks  PLANNED INTERVENTIONS: Language facilitation, Cueing hierachy, Internal/external aids, Functional tasks, Multimodal communication approach, SLP instruction and feedback, Compensatory strategies, and Patient/family education    Delon Bangs, M.S., CCC-SLP Speech-Language Pathologist [place] [facility] [phone] [place])  [place] [facility] [address] [place], [place], [address] Phone: [phone]   Fax:  [phone]

## 2023-12-14 NOTE — Therapy (Signed)
 OUTPATIENT PHYSICAL THERAPY NEURO TREATMENT   Patient Name: Hannah Kirby MRN: [id number] DOB:Jul 09, 1945, 78 y.o., female Today's Date: 12/14/2023   PCP: Gasper [name] BRAVO, MD REFERRING PROVIDER: Gasper [name] BRAVO, MD  END OF SESSION:    PT End of Session - 12/14/23 1452     Visit Number 12    Number of Visits 25    Date for Recertification  01/09/24    Progress Note Due on Visit 10    PT Start Time 1449    PT Stop Time 1530    PT Time Calculation (min) 41 min    Equipment Utilized During Treatment Gait belt    Activity Tolerance Patient tolerated treatment well    Behavior During Therapy WFL for tasks assessed/performed          Past Medical History:  Diagnosis Date   Acute hemorrhagic colitis 08/08/2019   Anemia    C. difficile colitis 08/09/2019   CAD (coronary artery disease)    a. 02/2006 PCI: BMS x 2 to RCA, cath o/w without significant coronary disease; b. nuclear stress test 07/2014: No ischemia/infarct; c. 11/2017 MV: no isch/infarct, EF 55-65%; d. 03/2020 NSTEMI/PCI: LM nl, LAD min irregs, RI 25, small, LCX nl, RCA 30p/m ISR, 99d (3.0x15 Resolute Onyx DES).   Cataract 02/04/2018   Cerebrovascular accident (CVA) (HCC) 03/18/2023   dysarthria   Chronic bronchitis (HCC)    secondary to cigarette smoking   FHx: allergies    Goiter    Granulomatous disease    Hernia    Kidney stone on left side 2013   NSTEMI (non-ST elevated myocardial infarction) (HCC) 03/23/2020   Oxygen  deficiency    Panic attacks    PVC's (premature ventricular contractions)    a. 03/2018 Zio: Occas PVCs (2.5%). Triggered events assoc w/ PVC/PAC.   Retinal detachment, left 03/04/2020   Retinal tear 2020   Stroke (HCC) 10/29/2016   mild left side weakness   Stroke (HCC) 08/01/2019   Tobacco abuse    Past Surgical History:  Procedure Laterality Date   ABDOMINAL HYSTERECTOMY     BREAST SURGERY     CATARACT EXTRACTION W/PHACO Right 03/01/2017   Procedure: CATARACT EXTRACTION PHACO AND  INTRAOCULAR LENS PLACEMENT (IOC) RIGHT DIABETIC;  Surgeon: Mittie Gaskin, MD;  Location: [facility];  Service: Ophthalmology;  Laterality: Right;   CATARACT EXTRACTION W/PHACO Left 03/22/2017   Procedure: CATARACT EXTRACTION PHACO AND INTRAOCULAR LENS PLACEMENT (IOC) LEFT DIABETIC;  Surgeon: Mittie Gaskin, MD;  Location: [facility];  Service: Ophthalmology;  Laterality: Left;  Diabetic - insulin  and oral meds   COLONOSCOPY WITH PROPOFOL  N/A 09/23/2014   Procedure: COLONOSCOPY WITH PROPOFOL ;  Surgeon: Gladis RAYMOND Mariner, MD;  Location: [facility] ENDOSCOPY;  Service: Endoscopy;  Laterality: N/A;   COLONOSCOPY WITH PROPOFOL  N/A 01/11/2018   Procedure: COLONOSCOPY WITH PROPOFOL ;  Surgeon: Mariner Gladis RAYMOND, MD;  Location: [facility];  Service: Endoscopy;  Laterality: N/A;   COLONOSCOPY WITH PROPOFOL  N/A 04/24/2018   Procedure: COLONOSCOPY WITH PROPOFOL ;  Surgeon: Mariner Gladis RAYMOND, MD;  Location: [facility] ENDOSCOPY;  Service: Endoscopy;  Laterality: N/A;   COLONOSCOPY WITH PROPOFOL  N/A 11/19/2019   Procedure: COLONOSCOPY WITH PROPOFOL ;  Surgeon: Jinny Carmine, MD;  Location: [facility] ENDOSCOPY;  Service: Endoscopy;  Laterality: N/A;   CORONARY ANGIOPLASTY WITH STENT PLACEMENT  2008   CORONARY STENT INTERVENTION N/A 03/24/2020   Procedure: CORONARY STENT INTERVENTION;  Surgeon: Mady Bruckner, MD;  Location: [facility] INVASIVE CV LAB;  Service: Cardiovascular;  Laterality: N/A;   ESOPHAGOGASTRODUODENOSCOPY (  EGD) WITH PROPOFOL  N/A 12/30/2014   Procedure: ESOPHAGOGASTRODUODENOSCOPY (EGD) WITH PROPOFOL ;  Surgeon: Gladis RAYMOND Mariner, MD;  Location: [facility] ENDOSCOPY;  Service: Endoscopy;  Laterality: N/A;   ESOPHAGOGASTRODUODENOSCOPY (EGD) WITH PROPOFOL  N/A 07/19/2016   Procedure: ESOPHAGOGASTRODUODENOSCOPY (EGD) WITH PROPOFOL ;  Surgeon: Mariner Gladis RAYMOND, MD;  Location: [facility] ENDOSCOPY;  Service: Endoscopy;  Laterality: N/A;   ESOPHAGOGASTRODUODENOSCOPY (EGD) WITH PROPOFOL  N/A 04/24/2018    Procedure: ESOPHAGOGASTRODUODENOSCOPY (EGD) WITH PROPOFOL ;  Surgeon: Mariner Gladis RAYMOND, MD;  Location: [facility] ENDOSCOPY;  Service: Endoscopy;  Laterality: N/A;   EYE SURGERY  cataracts, detached retina   hysterectomy (other)     LEFT HEART CATH AND CORONARY ANGIOGRAPHY N/A 03/24/2020   Procedure: LEFT HEART CATH AND CORONARY ANGIOGRAPHY;  Surgeon: Mady Bruckner, MD;  Location: [facility] INVASIVE CV LAB;  Service: Cardiovascular;  Laterality: N/A;   Patient Active Problem List   Diagnosis Date Noted   Stroke (HCC) 08/05/2023   Depression with anxiety 08/05/2023   Overweight (BMI 25.0-29.9) 08/05/2023   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) 03/17/2023   Dizziness 02/23/2023   Restrictive airway disease 07/21/2020   Partial thickness rotator cuff tear 03/20/2020   Shoulder pain 03/05/2020   Ataxia 02/28/2020   Difficulty walking 02/28/2020   Physical deconditioning 02/28/2020   Chronic diastolic congestive heart failure (HCC) 11/20/2019   Polyp of ascending colon    Hypokalemia 08/08/2019   TIA (transient ischemic attack) 08/01/2019   Benign hypertensive kidney disease with chronic kidney disease 04/25/2019   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/24/2018   Chronic, continuous use of opioids 03/26/2018   History of adenomatous polyp of colon 01/22/2018   Diverticulosis of colon 01/22/2018   History of CVA (cerebrovascular accident) 11/08/2016   Weakness of left upper extremity 10/30/2016   AAA (abdominal aortic aneurysm) without rupture 08/12/2016   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   Stage 3b chronic kidney disease (HCC) 07/27/2015   Type 2 diabetes mellitus with diabetic chronic kidney disease (HCC) 10/28/2014   Solitary pulmonary nodule on lung CT 08/20/2014   Allergic rhinitis 07/28/2014   Acid reflux 07/28/2014   Chronic obstructive pulmonary disease (HCC) 07/28/2014   Granuloma annulare    Back pain 11/12/2013   Lumbar scoliosis 10/30/2013    Lumbar radiculopathy 10/30/2013   Lumbar canal stenosis 10/30/2013   Proteinuria 10/22/2013   Smokes with greater than 30 pack year history 03/21/2011   Edema 08/18/2010   Hyperlipidemia 03/25/2009   Coronary artery disease 03/25/2009   Primary hypertension 03/25/2009    ONSET DATE: 08/05/23  REFERRING DIAG:  Z86.73 (ICD-10-CM) - History of CVA (cerebrovascular accident)  R27.0 (ICD-10-CM) - Ataxia    THERAPY DIAG:   Muscle weakness (generalized)  Other lack of coordination  Aphasia  Apraxia of speech  Unsteadiness on feet  Dizziness and giddiness  Abnormality of gait and mobility  Difficulty in walking, not elsewhere classified  Other abnormalities of gait and mobility  Apraxia following other cerebrovascular disease  Cognitive communication deficit  Dysarthria and anarthria  Rationale for Evaluation and Treatment: Rehabilitation  SUBJECTIVE:  SUBJECTIVE STATEMENT:  Pt reports that she has been having some low back pain since this morning.  Pt notes it's at a 6/10 pain upon arrival.    Pt accompanied by: self and family member, daughter  PERTINENT HISTORY:  PMH: of CVA, COPD, Dysarthria, Ataxia, TIA, T2DM, Back pain, scoliosis   PAIN:  Are you having pain? Yes: NPRS scale: 3/10 Pain location: low back and L hip Pain description: achy Aggravating factors: just hurts all the time. Relieving factors: hydrocodone  at night  PRECAUTIONS: Other: Pt has a looper to see if pt has a-fib  RED FLAGS: Abdominal aneurysm: Yes: pt reports having an abdominal aneurism as found by Dr. Marea.   WEIGHT BEARING RESTRICTIONS: No  FALLS: Has patient fallen in last 6 months? Yes. Number of falls 1.  Pt reports losing balance, and got caught up in cords that were on the floor when she was  trying to close the blinds.  LIVING ENVIRONMENT: Lives with: lives with their spouse Lives in: House/apartment Stairs: Yes: External: 1 steps; none Has following equipment at home: Single point cane, Walker - 2 wheeled, Environmental consultant - 4 wheeled, shower chair, and Grab bars  PLOF: Independent  PATIENT GOALS: Pt is wanting to gain independence.  Pt is wanting to improve endurance levels and improving speech with SLP.  OBJECTIVE:  Note: Objective measures were completed at Evaluation unless otherwise noted.  DIAGNOSTIC FINDINGS:   EXAM: MRI HEAD WITHOUT CONTRAST   MRA HEAD WITHOUT CONTRAST   MRA NECK WITHOUT AND WITH CONTRAST   IMPRESSION: 1. Small acute infarcts in the left frontal lobe, right parietal lobe, and left cerebellum. 2. Extensive chronic small vessel ischemic disease. 3. 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 4. Unchanged severe right A3 stenosis or segmental occlusion. 5. Moderate left P2 stenosis. 6. Limited assessment of the left vertebral artery origin, otherwise negative neck MRA.   COGNITION: Overall cognitive status: Within functional limits for tasks assessed   SENSATION: WFL, however pt notes having some numbness in the R hand.  COORDINATION: WFL  POSTURE: rounded shoulders, forward head, decreased lumbar lordosis, increased thoracic kyphosis, posterior pelvic tilt, and flexed trunk    LOWER EXTREMITY MMT:    MMT Right Eval Left Eval  Hip flexion 4 4  Hip extension    Hip abduction 4 4-  Hip adduction 4 4-  Hip internal rotation    Hip external rotation    Knee flexion 4 4-  Knee extension 4 4-  Ankle dorsiflexion 4 4-  Ankle plantarflexion    Ankle inversion    Ankle eversion    (Blank rows = not tested)  BED MOBILITY:  Not tested  FUNCTIONAL TESTS:  5 times sit to stand: 24.36 sec Timed up and go (TUG): 23.94 sec with walker 2 minute walk test: TBD 10 meter walk test: 20.33 sec with walker Berg Balance Scale:  TBD  PATIENT SURVEYS:   Stroke Impact Scale 16 (Copyrighted instrument, [facility])  In the past 2 weeks, how difficult was it to...  Rating Scale 5 = Not difficult at all 4 = A little difficult 3 = Somewhat difficult 2 = Very difficult 1 = Could not do at all  a. Dress the top part of your body? 4  b. Bathe yourself? 2  c. Get to the toilet on time? 5  d. Control your bladder (not have an accident)? 5  e. Control your bowels (not have an accident)? 5  f.  Stand without losing balance? 3  g. Go shopping? 2  h. Do heavy household chores (e.g. vacuum, laundry or  yard work)? 1  i. Stay sitting without losing your balance? 3  j. Walk without losing your balance? 2  k. Move from a bed to a chair? 3  l. Walk fast? 1  m. Climb one flight of stairs? 1  n. Walk one block? 1  o. Get in and out of a car? 2  p. Carry heavy objects (e.g. bag of groceries) with your  affected hand? 1  Sum:  41/80  MDC (Minimal Detectable Change) is >/=8                                                                                                                                 TREATMENT DATE: 12/14/23   Pt arrived to session in transport chair, daughter present.   SpO2: 92-94% upon arrival to the clinic with room air; seated.   SpO2 remained >90% throughout the entirety of the session   TherAct: To improve functional movements patterns for everyday tasks  Circuit Training  Attempt #1: Ambulation around the gym with use of rollator, 300', on room air, 86-90% during and 93% upon sitting  Standing lateral transfer of basket with 5# weight added to simulate clothes basket transfer in the laundry room, x5 each direction  Attempt #2: Ambulation around the gym with use of rollator, 300', on room air, 86-90% during and 93% upon sitting  Standing lateral transfer of basket with 5# weight added to simulate clothes basket transfer in the laundry room, x5 each  direction  Attempt #3: Ambulation around the gym with use of rollator, 300', on room air, 86-90% during and 93% upon sitting  Standing lateral transfer of basket with 5# weight added to simulate clothes basket transfer in the laundry room, x5 each direction   Therapeutic rest breaks utilized in between each attempt and vitals monitored, throughout, specifically with SpO2.  PATIENT EDUCATION: Education details: Pt educated on role of PT and services provided during current POC, along with prognosis and information about the clinic.  Person educated: Patient and Child(ren) Education method: Explanation Education comprehension: verbalized understanding and returned demonstration  HOME EXERCISE PROGRAM:  Access Code: [email or web address] URL: [email or web address] Date: 10/17/2023 Prepared by: Sidra Simpers  Exercises - Standing March with Counter Support  - 1 x daily - 3-4 x weekly - 3 sets - 10 reps - Standing Knee Flexion with Unilateral Counter Support  - 1 x daily - 7 x weekly - 3 sets - 10 reps - Standing Hip Extension with Unilateral Counter Support  - 1 x daily - 7 x weekly - 3 sets - 10 reps - Standing Hip Abduction with Unilateral Counter Support  - 1 x daily - 7 x weekly - 3 sets - 10 reps - Heel Toe Raises with Unilateral Counter Support  - 1 x daily - 7 x weekly -  3 sets - 10 reps  GOALS: Goals reviewed with patient? Yes  SHORT TERM GOALS: Target date: 11/15/2023  Pt will be independent with HEP in order to demonstrate increased ability to perform tasks related to occupation/hobbies. Baseline: pt given HEP at eval Goal status: INITIAL  LONG TERM GOALS: Target date: 01/10/2024  1.  Patient (> 52 years old) will complete five times sit to stand test in < 15 seconds indicating an increased LE strength and improved balance. Baseline: 24.36 sec Goal status: INITIAL  2.  Patient will improve SIS 16 score to 55   to demonstrate statistically significant improvement in  mobility and quality of life as it relates to their CVA.  Baseline: 41 Goal status: INITIAL   3.  Patient will increase Berg Balance score by > 6 points to demonstrate decreased fall risk during functional activities. Baseline: 33/56 Goal status: INITIAL   4.  Patient will reduce timed up and go to <11 seconds to reduce fall risk and demonstrate improved transfer/gait ability. Baseline: 23.94 sec with walker Goal status: INITIAL  5.  Patient will increase 10 meter walk test to >1.48m/s as to improve gait speed for better community ambulation and to reduce fall risk. Baseline: 20.33 sec with walker Goal status: INITIAL  6.  Patient will increase 2 minute walk test distance by 50 ft or greater for progression to community ambulator and demonstrate improved gait ability  Baseline: 275'; 83.82 m Goal status: INITIAL     ASSESSMENT:  CLINICAL IMPRESSION:  Pt performed well with the exercises given and put forth great effort throughout the session.  Pt noted to have improved breathing technique and was able to keep SpO2 >90% on room air today.  Pt continued with circuit training and responded well to it.  At the conclusion of the session, pt was transported to speech therapy and was given MHP to place on the low back for pain modulation.   Pt will continue to benefit from skilled therapy to address remaining deficits in order to improve overall QoL and return to PLOF.    Will continue to monitor O2 at subsequent sessions to see if pt is able to tolerate ambulation without supplemental O2.    OBJECTIVE IMPAIRMENTS: Abnormal gait, decreased activity tolerance, decreased balance, decreased endurance, decreased knowledge of use of DME, decreased mobility, difficulty walking, decreased strength, impaired sensation, and pain.   ACTIVITY LIMITATIONS: carrying, lifting, bending, standing, squatting, stairs, transfers, bathing, toileting, dressing, hygiene/grooming, and locomotion  level  PARTICIPATION LIMITATIONS: meal prep, cleaning, laundry, community activity, and yard work  PERSONAL FACTORS: Age and 3+ comorbidities:  PMH: of CVA, COPD, Dysarthria, Ataxia, TIA, T2DM, Back pain, scoliosis are also affecting patient's functional outcome.   REHAB POTENTIAL: Good  CLINICAL DECISION MAKING: Evolving/moderate complexity  EVALUATION COMPLEXITY: Moderate  PLAN:  PT FREQUENCY: 2x/week  PT DURATION: 12 weeks  PLANNED INTERVENTIONS: 97750- Physical Performance Testing, 97110-Therapeutic exercises, 97530- Therapeutic activity, 97112- Neuromuscular re-education, 97535- Self Care, [id number]- Manual therapy, [phone]- Gait training, Balance training, and Vestibular training  PLAN FOR NEXT SESSION:   Monitor SPO2 and get portable oxygen  if necessary  Endurance, balance, strength ( L>R), updated HEP, gait training  -Limiting seated rest breaks as tolerated to challenge endurance   Fonda Simpers, PT, DPT Physical Therapist - [facility]  12/14/23, 4:03 PM

## 2023-12-18 ENCOUNTER — Ambulatory Visit

## 2023-12-19 ENCOUNTER — Encounter

## 2023-12-19 ENCOUNTER — Ambulatory Visit: Admitting: Physical Therapy

## 2023-12-19 ENCOUNTER — Ambulatory Visit: Payer: Self-pay

## 2023-12-19 NOTE — Telephone Encounter (Signed)
 FYI Only or Action Required?: FYI only for provider.  Patient was last seen in primary care on 11/13/2023 by Gasper [name] BRAVO, MD.  Called Nurse Triage reporting Dizziness.  Symptoms began 2 months ago.  Interventions attempted: Rest, hydration, or home remedies.  Symptoms are: no symptoms at this time.  Triage Disposition: See Physician Within 24 Hours  Patient/caregiver understands and will follow disposition?: Yes     Copied from CRM #[id number]. Topic: Appointments - Appointment Scheduling >> Dec 19, 2023  2:54 PM Emylou G wrote: Med checkup for refills and changes -- Experiencing dizzy off and on for 6-8 weeks - worsening. Dr Gasper wants to review meds      Reason for Disposition  [1] NO dizziness now AND [2] age > 45  Answer Assessment - Initial Assessment Questions 1. DESCRIPTION: Describe your dizziness.     Experiencing intermittent dizziness  2. LIGHTHEADED: Do you feel lightheaded? (e.g., somewhat faint, woozy, weak upon standing)     No 3. VERTIGO: Do you feel like either you or the room is spinning or tilting? (i.e., vertigo)     Yes 4. SEVERITY: How bad is it?  Do you feel like you are going to faint? Can you stand and walk?     Mild to moderate  5. ONSET:  When did the dizziness begin?     Intermittent dizziness for 2 months, none since last week 7. HEART RATE: Can you tell me your heart rate? How many beats in 15 seconds?  (Note: Not all patients can do this.)       No 8. CAUSE: What do you think is causing the dizziness? (e.g., decreased fluids or food, diarrhea, emotional distress, heat exposure, new medicine, sudden standing, vomiting; unknown)     Unsure if due to her Felodipine   9. RECURRENT SYMPTOM: Have you had dizziness before? If Yes, ask: When was the last time? What happened that time?     Has had dizziness but not this consistent  10. OTHER SYMPTOMS: Do you have any other symptoms? (e.g., fever, chest pain, vomiting,  diarrhea, bleeding)       No  Protocols used: Dizziness - Vertigo-A-AH

## 2023-12-20 ENCOUNTER — Ambulatory Visit: Admitting: Family Medicine

## 2023-12-21 ENCOUNTER — Ambulatory Visit: Admitting: Occupational Therapy

## 2023-12-21 ENCOUNTER — Ambulatory Visit (INDEPENDENT_AMBULATORY_CARE_PROVIDER_SITE_OTHER)

## 2023-12-21 ENCOUNTER — Ambulatory Visit

## 2023-12-21 ENCOUNTER — Telehealth: Payer: Self-pay | Admitting: *Deleted

## 2023-12-21 DIAGNOSIS — R42 Dizziness and giddiness: Secondary | ICD-10-CM | POA: Diagnosis not present

## 2023-12-21 DIAGNOSIS — R262 Difficulty in walking, not elsewhere classified: Secondary | ICD-10-CM

## 2023-12-21 DIAGNOSIS — R278 Other lack of coordination: Secondary | ICD-10-CM | POA: Diagnosis not present

## 2023-12-21 DIAGNOSIS — R2689 Other abnormalities of gait and mobility: Secondary | ICD-10-CM

## 2023-12-21 DIAGNOSIS — M6281 Muscle weakness (generalized): Secondary | ICD-10-CM

## 2023-12-21 DIAGNOSIS — R269 Unspecified abnormalities of gait and mobility: Secondary | ICD-10-CM | POA: Diagnosis not present

## 2023-12-21 DIAGNOSIS — I639 Cerebral infarction, unspecified: Secondary | ICD-10-CM | POA: Diagnosis not present

## 2023-12-21 DIAGNOSIS — R2681 Unsteadiness on feet: Secondary | ICD-10-CM | POA: Diagnosis not present

## 2023-12-21 DIAGNOSIS — R41841 Cognitive communication deficit: Secondary | ICD-10-CM

## 2023-12-21 DIAGNOSIS — R482 Apraxia: Secondary | ICD-10-CM

## 2023-12-21 DIAGNOSIS — R4701 Aphasia: Secondary | ICD-10-CM

## 2023-12-21 LAB — CUP PACEART REMOTE DEVICE CHECK
Date Time Interrogation Session: 20251029235420
Implantable Pulse Generator Implant Date: 20250626

## 2023-12-21 NOTE — Therapy (Addendum)
 OUTPATIENT OCCUPATIONAL THERAPY NEURO TREATMENT NOTE  Patient Name: Hannah Kirby MRN: [id number] DOB:December 22, 1945, 78 y.o., female Today's Date: 12/21/2023  PCP: Dr. [name] Perry REFERRING PROVIDER: Dr. [name] Perry  END OF SESSION:  OT End of Session - 12/21/23 1619     Visit Number 8    Number of Visits 24    Date for Recertification  01/22/24    OT Start Time 1615    OT Stop Time 1700    OT Time Calculation (min) 45 min    Activity Tolerance Patient tolerated treatment well    Behavior During Therapy [facility] for tasks assessed/performed         Past Medical History:  Diagnosis Date   Acute hemorrhagic colitis 08/08/2019   Anemia    C. difficile colitis 08/09/2019   CAD (coronary artery disease)    a. 02/2006 PCI: BMS x 2 to RCA, cath o/w without significant coronary disease; b. nuclear stress test 07/2014: No ischemia/infarct; c. 11/2017 MV: no isch/infarct, EF 55-65%; d. 03/2020 NSTEMI/PCI: LM nl, LAD min irregs, RI 25, small, LCX nl, RCA 30p/m ISR, 99d (3.0x15 Resolute Onyx DES).   Cataract 02/04/2018   Cerebrovascular accident (CVA) (HCC) 03/18/2023   dysarthria   Chronic bronchitis (HCC)    secondary to cigarette smoking   FHx: allergies    Goiter    Granulomatous disease    Hernia    Kidney stone on left side 2013   NSTEMI (non-ST elevated myocardial infarction) (HCC) 03/23/2020   Oxygen  deficiency    Panic attacks    PVC's (premature ventricular contractions)    a. 03/2018 Zio: Occas PVCs (2.5%). Triggered events assoc w/ PVC/PAC.   Retinal detachment, left 03/04/2020   Retinal tear 2020   Stroke (HCC) 10/29/2016   mild left side weakness   Stroke (HCC) 08/01/2019   Tobacco abuse    Past Surgical History:  Procedure Laterality Date   ABDOMINAL HYSTERECTOMY     BREAST SURGERY     CATARACT EXTRACTION W/PHACO Right 03/01/2017   Procedure: CATARACT EXTRACTION PHACO AND INTRAOCULAR LENS PLACEMENT (IOC) RIGHT DIABETIC;  Surgeon: Mittie Gaskin, MD;   Location: [facility];  Service: Ophthalmology;  Laterality: Right;   CATARACT EXTRACTION W/PHACO Left 03/22/2017   Procedure: CATARACT EXTRACTION PHACO AND INTRAOCULAR LENS PLACEMENT (IOC) LEFT DIABETIC;  Surgeon: Mittie Gaskin, MD;  Location: [facility];  Service: Ophthalmology;  Laterality: Left;  Diabetic - insulin  and oral meds   COLONOSCOPY WITH PROPOFOL  N/A 09/23/2014   Procedure: COLONOSCOPY WITH PROPOFOL ;  Surgeon: Gladis RAYMOND Mariner, MD;  Location: [facility] ENDOSCOPY;  Service: Endoscopy;  Laterality: N/A;   COLONOSCOPY WITH PROPOFOL  N/A 01/11/2018   Procedure: COLONOSCOPY WITH PROPOFOL ;  Surgeon: Mariner Gladis RAYMOND, MD;  Location: [facility] ENDOSCOPY;  Service: Endoscopy;  Laterality: N/A;   COLONOSCOPY WITH PROPOFOL  N/A 04/24/2018   Procedure: COLONOSCOPY WITH PROPOFOL ;  Surgeon: Mariner Gladis RAYMOND, MD;  Location: [facility] ENDOSCOPY;  Service: Endoscopy;  Laterality: N/A;   COLONOSCOPY WITH PROPOFOL  N/A 11/19/2019   Procedure: COLONOSCOPY WITH PROPOFOL ;  Surgeon: Jinny Carmine, MD;  Location: [facility] ENDOSCOPY;  Service: Endoscopy;  Laterality: N/A;   CORONARY ANGIOPLASTY WITH STENT PLACEMENT  2008   CORONARY STENT INTERVENTION N/A 03/24/2020   Procedure: CORONARY STENT INTERVENTION;  Surgeon: Mady Bruckner, MD;  Location: [facility] INVASIVE CV LAB;  Service: Cardiovascular;  Laterality: N/A;   ESOPHAGOGASTRODUODENOSCOPY (EGD) WITH PROPOFOL  N/A 12/30/2014   Procedure: ESOPHAGOGASTRODUODENOSCOPY (EGD) WITH PROPOFOL ;  Surgeon: Gladis RAYMOND Mariner, MD;  Location: [facility] ENDOSCOPY;  Service:  Endoscopy;  Laterality: N/A;   ESOPHAGOGASTRODUODENOSCOPY (EGD) WITH PROPOFOL  N/A 07/19/2016   Procedure: ESOPHAGOGASTRODUODENOSCOPY (EGD) WITH PROPOFOL ;  Surgeon: Gaylyn Gladis PENNER, MD;  Location: [facility] ENDOSCOPY;  Service: Endoscopy;  Laterality: N/A;   ESOPHAGOGASTRODUODENOSCOPY (EGD) WITH PROPOFOL  N/A 04/24/2018   Procedure: ESOPHAGOGASTRODUODENOSCOPY (EGD) WITH PROPOFOL ;  Surgeon: Gaylyn Gladis PENNER, MD;  Location: [facility] ENDOSCOPY;  Service: Endoscopy;  Laterality: N/A;   EYE SURGERY  cataracts, detached retina   hysterectomy (other)     LEFT HEART CATH AND CORONARY ANGIOGRAPHY N/A 03/24/2020   Procedure: LEFT HEART CATH AND CORONARY ANGIOGRAPHY;  Surgeon: Mady Bruckner, MD;  Location: [facility] INVASIVE CV LAB;  Service: Cardiovascular;  Laterality: N/A;   Patient Active Problem List   Diagnosis Date Noted   Stroke (HCC) 08/05/2023   Depression with anxiety 08/05/2023   Overweight (BMI 25.0-29.9) 08/05/2023   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) 03/17/2023   Dizziness 02/23/2023   Restrictive airway disease 07/21/2020   Partial thickness rotator cuff tear 03/20/2020   Shoulder pain 03/05/2020   Ataxia 02/28/2020   Difficulty walking 02/28/2020   Physical deconditioning 02/28/2020   Chronic diastolic congestive heart failure (HCC) 11/20/2019   Polyp of ascending colon    Hypokalemia 08/08/2019   TIA (transient ischemic attack) 08/01/2019   Benign hypertensive kidney disease with chronic kidney disease 04/25/2019   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/24/2018   Chronic, continuous use of opioids 03/26/2018   History of adenomatous polyp of colon 01/22/2018   Diverticulosis of colon 01/22/2018   History of CVA (cerebrovascular accident) 11/08/2016   Weakness of left upper extremity 10/30/2016   AAA (abdominal aortic aneurysm) without rupture 08/12/2016   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   Stage 3b chronic kidney disease (HCC) 07/27/2015   Type 2 diabetes mellitus with diabetic chronic kidney disease (HCC) 10/28/2014   Solitary pulmonary nodule on lung CT 08/20/2014   Allergic rhinitis 07/28/2014   Acid reflux 07/28/2014   Chronic obstructive pulmonary disease (HCC) 07/28/2014   Granuloma annulare    Back pain 11/12/2013   Lumbar scoliosis 10/30/2013   Lumbar radiculopathy 10/30/2013   Lumbar canal stenosis 10/30/2013   Proteinuria  10/22/2013   Smokes with greater than 30 pack year history 03/21/2011   Edema 08/18/2010   Hyperlipidemia 03/25/2009   Coronary artery disease 03/25/2009   Primary hypertension 03/25/2009   ONSET DATE: 08/05/2023  REFERRING DIAG: R29.898 (ICD-10-CM) - Weakness of left upper extremity   THERAPY DIAG:  Muscle weakness (generalized)  Other lack of coordination  Rationale for Evaluation and Treatment: Rehabilitation  SUBJECTIVE:  SUBJECTIVE STATEMENT:  Patient reports feeling better today Pt accompanied by: family member: daughter, Harrie  PERTINENT HISTORY: Daughter present and provided hx.  Daughter reports pt has had 4 strokes, beginning in 2018, 2019, January of 2025 and the most recent in June of 2025.  Pt had been participating in [facility] since the most recent CVA, and is now eager to transition to outpatient services where PT/OT/SLP have been ordered. Per MEDICAL RECORD NUMBERPMH: of CVA, COPD, Dysarthria, Ataxia, TIA, T2DM, Back pain, scoliosis   PRECAUTIONS: Fall, Loop recorder for Afib  WEIGHT BEARING RESTRICTIONS: No  PAIN:  12/21/2023: 4 out of 10 low back pain 12/11/23: chronic back pain with prolonged standing 3/10 11/21/23: 3/10 chronic back pain 11/08/23: Same as below 11/02/23: same as eval below Are you having pain? Yes: NPRS scale: 3/10, can get up to 7-8/10 Pain location: low back and L hip Pain description: achy, sharp, dull Aggravating  factors: prolonged standing  Relieving factors: sitting, pain meds, heat/ice   FALLS: Has patient fallen in last 6 months? Yes. Number of falls 1, tripping over cords  LIVING ENVIRONMENT: Lives with: lives with their spouse Lives in: 1 level home Stairs: Yes: External: 1 steps; none Has following equipment at home: walk in shower, comfort height toilet   PLOF: Prior to Jan of this year, pt was managing all home management tasks, was able to drive, and was caring for spouse   PATIENT GOALS: Strengthening the left arm    OBJECTIVE:  Note: Objective measures were completed at Evaluation unless otherwise noted.  HAND DOMINANCE: Right  ADLs: Overall ADLs: Daughter is pt's primary caregiver and lives near by Transfers/ambulation related to ADLs: occasional help with using walker at home, depending on balance on a given day Eating: indep/able to cut food  Grooming: bimanual grooming without difficulty UB Dressing: extra time for clothing fasteners, but able to gather clothing with RW LB Dressing: extra time with clothing fasteners Toileting: occasional help to walk to the bathroom, but modified indep within the bathroom for toileting tasks Bathing: direct supv for bathing using walkin in shower, shower chair, and grab bar; gets hair washed weekly at salon Tub Shower transfers: supv for walk in shower Equipment: RW, SBQC, shower chair, grab bars  IADLs: Shopping: daughter currently managing Light housekeeping: daughter currently managing; can start laundry on a good day.  Meal Prep: starting to participate in light meal prep, including making eggs on stove top Community mobility: daughter assists with all transportation; pt using transport chair to reach therapy gym today Medication management: daughter sets up pills 2 weeks at a time  Financial management: pt is participating in bill paying with daughter's assistance; most are online Handwriting: 100% legible  POSTURE COMMENTS:  rounded shoulders, forward head, increased thoracic kyphosis  ACTIVITY TOLERANCE: Activity tolerance: to be further assessed within functional contexts  FUNCTIONAL OUTCOME MEASURES: TBD  UPPER EXTREMITY ROM:  BUEs WFL for daily tasks  UPPER EXTREMITY MMT:  Hx of R rotator cuff tear 4-5 years ago with residual weakness, non-surgical;    MMT Right eval Left eval  Shoulder flexion 3+ 4  Shoulder abduction 3+ 4  Shoulder adduction    Shoulder extension    Shoulder internal rotation 3+ 4-  Shoulder external  rotation 3+ 3+  Elbow flexion 5 5  Elbow extension 5 5  Wrist flexion 4+ 5  Wrist extension 4+ 5  Wrist ulnar deviation    Wrist radial deviation    Wrist pronation    Wrist supination    (Blank rows = not tested)   *Most recent CVA affecting L non-dominant side, with LUE presenting with increased strength as compared to dominant/unaffected arm.  Assume RUE weaker from hx of rotator cuff tear, or possibly previous CVAs.  Pt does present with mild LUE ataxia, and bilat hand weakness.  HAND FUNCTION: Grip strength: Right: 32 lbs; Left: 30 lbs, Lateral pinch: Right: 8 lbs, Left: 6 lbs, and 3 point pinch: Right: 7 lbs, Left: 6 lbs  COORDINATION: Finger Nose Finger test: LUE mild ataxia  9 Hole Peg test: Right: 29 sec; Left: 47 sec  SENSATION: Pt reports intermittent numbness in the R thumb, IF, and LF  EDEMA: No visible edema  MUSCLE TONE: RUE: Within functional limits and LUE: Within functional limits  COGNITION: Overall cognitive status: Impaired; see SLP eval for details  VISION: Pt reports L eye feels more blurry since first stroke in 2018;  pt has reading glasses but doesn't usually wear them   VISION ASSESSMENT: To be further assessed in functional context Tracking/Visual pursuits: Able to track stimulus in all quads without difficulty Saccades: WFL Visual Fields: no apparent deficits Clock drawing completed: all numbers spaced evenly, but pt left out the 1 and verbalized not knowing how to draw ten minutes to eleven when this direction was given.  Patient has difficulty with following activities due to following visual impairments: To be further assessed  PERCEPTION: To be further assessed  PRAXIS: Impaired: Motor planning  OBSERVATIONS: Pt pleasant, cooperative.  Daughter present and very supportive in pt's care.                                                                                                                       TREATMENT DATE:12/21/23  Therapeutic  Activity:  -Facilitated left hand and fine motor coordination skills grasping, and flipping them with her 2nd digit while modulating the amount of pressure while holding the desk between her third digit and thumb. - Facilitated bilateral alternating hand patterns flipping the [place]  discs in opposite directions with the right and left hands while attempting to simultaneously turn them at the same time. - Facilitated left hand function skills grasping and storing for 4 [place]  discs at the same time the palm of her hand, then worked on transitory movements and progressing them through her hand from her palm to the tip of her second digit and thumb in preparation for placing them back in the container.  Patient worked on reaching to the far left to place them back in the container. - Facilitated left hand fine motor coordination skills grasping 1/2, 3/4, and 1 washers from a magnetic dish and storing them in the palm of her hand, then worked on progressing them through her hand from the palm to the tip of her second digit and thumb in preparation for placing them onto a horizontal dowel.  PATIENT EDUCATION: Education details: ADL strategies Person educated: Patient and Child(ren) Education method: Explanation and Verbal cues Education comprehension: verbalized understanding  HOME EXERCISE PROGRAM: Yellow theraputty  GOALS: Goals reviewed with patient? Yes  SHORT TERM GOALS: Target date: 12/11/23  Pt will perform HEP with min vc or less for improving LUE coordination and bilat hand strength. Baseline: Eval: HEP not yet initiated Goal status: INITIAL  2.  Pt will be indep to verbalize and implement 2-3 fall prevention measures to reduce fall risk with ADLs.  Baseline: Eval: Educ not yet initiated  Goal status: INITIAL  LONG TERM GOALS: Target date: 01/22/24  Pt will perform shower transfers with modified indep Baseline: Eval: Direct supv Goal status: INITIAL  2.  Pt will improve  LUE GMC to enable reaching with good accuracy into overhead kitchen cabinets. Baseline: Eval: Mild LUE ataxia  Goal status: INITIAL  3.  Pt will improve L hand [facility] skills as demonstrated by 10 sec or more improvement in 9 hole peg test for improved efficiency when manipulating  clothing fasteners. Baseline: Eval: L 9 hole peg test: 47 sec, R 29 sec Goal status: INITIAL  4.  Pt will manage light loads of laundry with modified indep. Baseline: Eval: Daughter mostly manages, but pt can start laundry on a good day Goal status: INITIAL  ASSESSMENT:  CLINICAL IMPRESSION:  Upon arrival patient requested to work on left hand function and fine motor coordination tasks. patient reports having 4/10 back pain today.patient requires continuous use for form and technique the [place]  disc task.  Patient requires verbal cues and cues for visual demonstration of proper technique prior to the task and during the task with occasional mirroring of the movement.  Patient required 1 rest break during the bilateral simultaneous and alternating movements with the [place]  discs. Patient presented with increased difficulty dual tasking performing the task while carrying on a conversation. Patient reports that laundry tasks are going better at home patient reports laundry tasks are going better at home. Pt. continues to benefit from skilled OT to address above noted deficits while working to maximize pt's level of indep with ADL/IADL tasks in order to reduce burden of care on daughter and pt's spouse, and to improve QOL.     PERFORMANCE DEFICITS: in functional skills including ADLs, IADLs, coordination, dexterity, sensation, ROM, strength, pain, Fine motor control, Gross motor control, mobility, balance, body mechanics, endurance, decreased knowledge of precautions, decreased knowledge of use of DME, vision, and UE functional use, cognitive skills including emotional, perception, problem solving, safety awareness,  temperament/personality, and understand, and psychosocial skills including coping strategies, environmental adaptation, and routines and behaviors.   IMPAIRMENTS: are limiting patient from ADLs, IADLs, leisure, and social participation.   CO-MORBIDITIES: has co-morbidities such as afib, AAA, hx of multiple CVAs that affects occupational performance. Patient will benefit from skilled OT to address above impairments and improve overall function.  MODIFICATION OR ASSISTANCE TO COMPLETE EVALUATION: Min-Moderate modification of tasks or assist with assess necessary to complete an evaluation.  OT OCCUPATIONAL PROFILE AND HISTORY: Detailed assessment: Review of records and additional review of physical, cognitive, psychosocial history related to current functional performance.  CLINICAL DECISION MAKING: Moderate - several treatment options, min-mod task modification necessary  REHAB POTENTIAL: Good  EVALUATION COMPLEXITY: Moderate    PLAN:  OT FREQUENCY: 2x/week  OT DURATION: 12 weeks  PLANNED INTERVENTIONS: 97168 OT Re-evaluation, 97535 self care/ADL training, 02889 therapeutic exercise, 97530 therapeutic activity, 97112 neuromuscular re-education, 97140 manual therapy, 97116 gait training, 02989 moist heat, 97010 cryotherapy, 97129 Cognitive training (first 15 min), 02249 Physical Performance Testing, balance training, functional mobility training, visual/perceptual remediation/compensation, psychosocial skills training, energy conservation, coping strategies training, patient/family education, and DME and/or AE instructions  RECOMMENDED OTHER SERVICES: none at this time (OT/PT/SLP services have all been ordered)  CONSULTED AND AGREED WITH PLAN OF CARE: Patient and family member/caregiver  PLAN FOR NEXT SESSION: see above  Richardson Otter, MS, OTR/L   12/21/2023, 5:16 PM

## 2023-12-21 NOTE — Telephone Encounter (Signed)
 Attempted to contact pt but had to leave VM for pt to call regarding scheduling follow up lung screening CT.

## 2023-12-21 NOTE — Therapy (Signed)
 OUTPATIENT SPEECH LANGUAGE PATHOLOGY APHASIA TREATMENT   Patient Name: Hannah Kirby MRN: [id number] DOB:Dec 01, 1945, 78 y.o., female Today's Date: 12/21/2023  PCP: [name] Perry, MD REFERRING PROVIDER: [name] Perry, MD   End of Session - 12/21/23 1524     Visit Number 13    Number of Visits 23    Date for Recertification  01/09/24    Progress Note Due on Visit 20    SLP Start Time 1530    SLP Stop Time  1615    SLP Time Calculation (min) 45 min    Activity Tolerance Patient tolerated treatment well          Past Medical History:  Diagnosis Date   Acute hemorrhagic colitis 08/08/2019   Anemia    C. difficile colitis 08/09/2019   CAD (coronary artery disease)    a. 02/2006 PCI: BMS x 2 to RCA, cath o/w without significant coronary disease; b. nuclear stress test 07/2014: No ischemia/infarct; c. 11/2017 MV: no isch/infarct, EF 55-65%; d. 03/2020 NSTEMI/PCI: LM nl, LAD min irregs, RI 25, small, LCX nl, RCA 30p/m ISR, 99d (3.0x15 Resolute Onyx DES).   Cataract 02/04/2018   Cerebrovascular accident (CVA) (HCC) 03/18/2023   dysarthria   Chronic bronchitis (HCC)    secondary to cigarette smoking   FHx: allergies    Goiter    Granulomatous disease    Hernia    Kidney stone on left side 2013   NSTEMI (non-ST elevated myocardial infarction) (HCC) 03/23/2020   Oxygen  deficiency    Panic attacks    PVC's (premature ventricular contractions)    a. 03/2018 Zio: Occas PVCs (2.5%). Triggered events assoc w/ PVC/PAC.   Retinal detachment, left 03/04/2020   Retinal tear 2020   Stroke (HCC) 10/29/2016   mild left side weakness   Stroke (HCC) 08/01/2019   Tobacco abuse    Past Surgical History:  Procedure Laterality Date   ABDOMINAL HYSTERECTOMY     BREAST SURGERY     CATARACT EXTRACTION W/PHACO Right 03/01/2017   Procedure: CATARACT EXTRACTION PHACO AND INTRAOCULAR LENS PLACEMENT (IOC) RIGHT DIABETIC;  Surgeon: Mittie Gaskin, MD;  Location: [facility];   Service: Ophthalmology;  Laterality: Right;   CATARACT EXTRACTION W/PHACO Left 03/22/2017   Procedure: CATARACT EXTRACTION PHACO AND INTRAOCULAR LENS PLACEMENT (IOC) LEFT DIABETIC;  Surgeon: Mittie Gaskin, MD;  Location: [facility];  Service: Ophthalmology;  Laterality: Left;  Diabetic - insulin  and oral meds   COLONOSCOPY WITH PROPOFOL  N/A 09/23/2014   Procedure: COLONOSCOPY WITH PROPOFOL ;  Surgeon: Gladis RAYMOND Mariner, MD;  Location: [facility] ENDOSCOPY;  Service: Endoscopy;  Laterality: N/A;   COLONOSCOPY WITH PROPOFOL  N/A 01/11/2018   Procedure: COLONOSCOPY WITH PROPOFOL ;  Surgeon: Mariner Gladis RAYMOND, MD;  Location: [facility] ENDOSCOPY;  Service: Endoscopy;  Laterality: N/A;   COLONOSCOPY WITH PROPOFOL  N/A 04/24/2018   Procedure: COLONOSCOPY WITH PROPOFOL ;  Surgeon: Mariner Gladis RAYMOND, MD;  Location: [facility] ENDOSCOPY;  Service: Endoscopy;  Laterality: N/A;   COLONOSCOPY WITH PROPOFOL  N/A 11/19/2019   Procedure: COLONOSCOPY WITH PROPOFOL ;  Surgeon: Jinny Carmine, MD;  Location: [facility] ENDOSCOPY;  Service: Endoscopy;  Laterality: N/A;   CORONARY ANGIOPLASTY WITH STENT PLACEMENT  2008   CORONARY STENT INTERVENTION N/A 03/24/2020   Procedure: CORONARY STENT INTERVENTION;  Surgeon: Mady Bruckner, MD;  Location: [facility] INVASIVE CV LAB;  Service: Cardiovascular;  Laterality: N/A;   ESOPHAGOGASTRODUODENOSCOPY (EGD) WITH PROPOFOL  N/A 12/30/2014   Procedure: ESOPHAGOGASTRODUODENOSCOPY (EGD) WITH PROPOFOL ;  Surgeon: Gladis RAYMOND Mariner, MD;  Location: [facility] ENDOSCOPY;  Service: Endoscopy;  Laterality: N/A;   ESOPHAGOGASTRODUODENOSCOPY (EGD) WITH PROPOFOL  N/A 07/19/2016   Procedure: ESOPHAGOGASTRODUODENOSCOPY (EGD) WITH PROPOFOL ;  Surgeon: Gaylyn Gladis PENNER, MD;  Location: [facility] ENDOSCOPY;  Service: Endoscopy;  Laterality: N/A;   ESOPHAGOGASTRODUODENOSCOPY (EGD) WITH PROPOFOL  N/A 04/24/2018   Procedure: ESOPHAGOGASTRODUODENOSCOPY (EGD) WITH PROPOFOL ;  Surgeon: Gaylyn Gladis PENNER, MD;  Location: [facility]  ENDOSCOPY;  Service: Endoscopy;  Laterality: N/A;   EYE SURGERY  cataracts, detached retina   hysterectomy (other)     LEFT HEART CATH AND CORONARY ANGIOGRAPHY N/A 03/24/2020   Procedure: LEFT HEART CATH AND CORONARY ANGIOGRAPHY;  Surgeon: Mady Bruckner, MD;  Location: [facility] INVASIVE CV LAB;  Service: Cardiovascular;  Laterality: N/A;   Patient Active Problem List   Diagnosis Date Noted   Stroke (HCC) 08/05/2023   Depression with anxiety 08/05/2023   Overweight (BMI 25.0-29.9) 08/05/2023   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) 03/17/2023   Dizziness 02/23/2023   Restrictive airway disease 07/21/2020   Partial thickness rotator cuff tear 03/20/2020   Shoulder pain 03/05/2020   Ataxia 02/28/2020   Difficulty walking 02/28/2020   Physical deconditioning 02/28/2020   Chronic diastolic congestive heart failure (HCC) 11/20/2019   Polyp of ascending colon    Hypokalemia 08/08/2019   TIA (transient ischemic attack) 08/01/2019   Benign hypertensive kidney disease with chronic kidney disease 04/25/2019   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/24/2018   Chronic, continuous use of opioids 03/26/2018   History of adenomatous polyp of colon 01/22/2018   Diverticulosis of colon 01/22/2018   History of CVA (cerebrovascular accident) 11/08/2016   Weakness of left upper extremity 10/30/2016   AAA (abdominal aortic aneurysm) without rupture 08/12/2016   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   Stage 3b chronic kidney disease (HCC) 07/27/2015   Type 2 diabetes mellitus with diabetic chronic kidney disease (HCC) 10/28/2014   Solitary pulmonary nodule on lung CT 08/20/2014   Allergic rhinitis 07/28/2014   Acid reflux 07/28/2014   Chronic obstructive pulmonary disease (HCC) 07/28/2014   Granuloma annulare    Back pain 11/12/2013   Lumbar scoliosis 10/30/2013   Lumbar radiculopathy 10/30/2013   Lumbar canal stenosis 10/30/2013   Proteinuria 10/22/2013   Smokes  with greater than 30 pack year history 03/21/2011   Edema 08/18/2010   Hyperlipidemia 03/25/2009   Coronary artery disease 03/25/2009   Primary hypertension 03/25/2009    ONSET DATE: 03/17/23 (admitted with stroke), 05/23/23 (referral date)  REFERRING DIAG:  R41.841 (ICD-10-CM) - Cognitive communication deficit  I63.9 (ICD-10-CM) - CVA (cerebral vascular accident) (HCC)  R47.02 (ICD-10-CM) - Dysphasia    THERAPY DIAG:  Aphasia  Apraxia of speech  Rationale for Evaluation and Treatment Rehabilitation  SUBJECTIVE:   SUBJECTIVE STATEMENT: Pt alert, pleasant, and cooperative.  Pt accompanied by: self and family member  PERTINENT HISTORY:  Pt is a 78 y.o. female who presents for communication evaluation in setting on stroke. Pt in ED 1/24-1/25/25. Pt d/c'd home with HH ST. PMHx significant of   HTN, CKD stage IIIB, DMT2 on insulin , CAD, chronic smoking, chronic respiratory failure with COPD and on home oxygen , anxiety, HLD. MRI 03/17/23 1. Punctate foci of restricted diffusion in the left posterior  frontal white matter, most likely acute infarcts.  2. Numerous foci of hemosiderin deposition, which are seen in the  deep gray structures but also in the bilateral cerebral hemispheres,  with 1 new focus compared to 02/20/2023. While this could be the  sequela of chronic hypertensive microhemorrhages, this appearance is  concerning for cerebral amyloid angiopathy.  PAIN:  Are you having pain? No  FALLS: defer to PT  LIVING ENVIRONMENT: Lives with: lives with their spouse Lives in: House/apartment  PLOF:  Level of assistance: Independent with ADLs Employment: Retired   PATIENT GOALS    for communication to improve   OBJECTIVE:  TODAY'S TREATMENT:   Pt provided x2 details per object during SFA task with min cues.  Pt generated sentences of >3 words with 90% accuracy, min cues to improve to 100%.  Pt repeated 5 word phrases with 80% accuracy with mod/max cueing.    Pt generated x3 words per concrete category with min/mod cues.   Mildly improved verbal fluency during informal conversational exchanges this date.     PATIENT EDUCATION: Education details: as above Person educated: Patient and Child Education method: Explanation Education comprehension: verbalized understanding and needs further education  HOME EXERCISE PROGRAM:    SGD home practice worksheet    GOALS:  Goals reviewed with patient? Yes  SHORT TERM GOALS: Target date: 10 sessions  Pt will participate in further assessment of functional reading/writing. Baseline: Goal status: MET  2.  With Moderate A, patient will complete a semantic feature analysis with at least 2 relevant features for 8/10 target words to improve word-finding skills.  Baseline:  Goal status: PROGRESSING  3.  With Maximal A, patient will generate sentences with 3 or more words in response to a situation at 80% accuracy in order to increase ability to communicate basic wants and needs.  Baseline:  Goal status: PROGRESSING  4.   With Maximal A,  pt will follow 2-step commands for improved participation in ADLs/iADLs with max cueing. Baseline:  Goal status: INITIAL  5.  Pt will repeat short sentences (<4 words) with good approximations 80% of the time with max cueing. Baseline:  Goal status: INITIAL    LONG TERM GOALS: Target date: 12 weeks  Pt will report a subjective improvement in communication per PROM.  Baseline: CES 12/32 on 8/26 Goal status: INITIAL  2.  With Min A, patient/family will demonstrate understanding of the following concepts: aphasia, spontaneous recovery, communication vs conversation, strengths/strategies to promote success, local resources in order to increase patient's participation in medical care.    Baseline:  Goal status: INITIAL   ASSESSMENT:  CLINICAL IMPRESSION: Pt is a 78 y.o. female who presents for communication treatment in setting on stroke. Pt known to  SLP services and was receiving OP ST prior to most recent stroke 08/05/23. Pt d/c'd home with [facility] ST who is working on securing SGD for pt. PMHx significant of  HTN, CKD stage IIIB, DMT2 on insulin , CAD, chronic smoking, chronic respiratory failure with COPD and on home oxygen , anxiety, HLD. Assessment this episode completed via functional/dynamic means including Western Aphasia Battery Revised (WAB-R) and PROM (Communication Effectiveness Scale). 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 See details of tx session above. Recommend course of ST targeting functional communication, further assessment of functional reading/writing, and pt/caregiver training to help promote QoL and overall life participation.   OBJECTIVE IMPAIRMENTS include expressive language, receptive language, and aphasia. These impairments are limiting patient from ADLs/IADLs and effectively communicating at home and in community. Factors affecting potential to achieve goals and functional outcome are severity of impairments. Patient will benefit from skilled SLP services to address above impairments and improve overall function.  REHAB POTENTIAL: Good  PLAN: SLP FREQUENCY: 2x/week  SLP DURATION: 12 weeks  PLANNED INTERVENTIONS: Language  facilitation, Cueing hierachy, Internal/external aids, Functional tasks, Multimodal communication approach, SLP instruction and feedback, Compensatory strategies, and Patient/family education    Delon Bangs, M.S., CCC-SLP Speech-Language Pathologist [facility] [phone] [place])  [facility] [address] [place], [place], [address] Phone: [phone]   Fax:  [phone]

## 2023-12-21 NOTE — Therapy (Signed)
 OUTPATIENT PHYSICAL THERAPY NEURO TREATMENT   Patient Name: Hannah Kirby MRN: [id number] DOB:[birth year], 78 y.o., female Today's Date: 12/21/2023   PCP: Gasper [name] BRAVO, MD REFERRING PROVIDER: Gasper [name] BRAVO, MD  END OF SESSION:    PT End of Session - 12/21/23 1449     Visit Number 13    Number of Visits 25    Date for Recertification  01/09/24    Progress Note Due on Visit 10    PT Start Time 1449    PT Stop Time 1530    PT Time Calculation (min) 41 min    Equipment Utilized During Treatment Gait belt    Activity Tolerance Patient tolerated treatment well    Behavior During Therapy WFL for tasks assessed/performed         Past Medical History:  Diagnosis Date   Acute hemorrhagic colitis 08/08/2019   Anemia    C. difficile colitis 08/09/2019   CAD (coronary artery disease)    a. 02/2006 PCI: BMS x 2 to RCA, cath o/w without significant coronary disease; b. nuclear stress test 07/2014: No ischemia/infarct; c. 11/2017 MV: no isch/infarct, EF 55-65%; d. 03/2020 NSTEMI/PCI: LM nl, LAD min irregs, RI 25, small, LCX nl, RCA 30p/m ISR, 99d (3.0x15 Resolute Onyx DES).   Cataract 02/04/2018   Cerebrovascular accident (CVA) (HCC) 03/18/2023   dysarthria   Chronic bronchitis (HCC)    secondary to cigarette smoking   FHx: allergies    Goiter    Granulomatous disease    Hernia    Kidney stone on left side 2013   NSTEMI (non-ST elevated myocardial infarction) (HCC) 03/23/2020   Oxygen  deficiency    Panic attacks    PVC's (premature ventricular contractions)    a. 03/2018 Zio: Occas PVCs (2.5%). Triggered events assoc w/ PVC/PAC.   Retinal detachment, left 03/04/2020   Retinal tear 2020   Stroke (HCC) 10/29/2016   mild left side weakness   Stroke (HCC) 08/01/2019   Tobacco abuse    Past Surgical History:  Procedure Laterality Date   ABDOMINAL HYSTERECTOMY     BREAST SURGERY     CATARACT EXTRACTION W/PHACO Right 03/01/2017   Procedure: CATARACT EXTRACTION PHACO AND  INTRAOCULAR LENS PLACEMENT (IOC) RIGHT DIABETIC;  Surgeon: Mittie Gaskin, MD;  Location: [facility];  Service: Ophthalmology;  Laterality: Right;   CATARACT EXTRACTION W/PHACO Left 03/22/2017   Procedure: CATARACT EXTRACTION PHACO AND INTRAOCULAR LENS PLACEMENT (IOC) LEFT DIABETIC;  Surgeon: Mittie Gaskin, MD;  Location: [facility];  Service: Ophthalmology;  Laterality: Left;  Diabetic - insulin  and oral meds   COLONOSCOPY WITH PROPOFOL  N/A 09/23/2014   Procedure: COLONOSCOPY WITH PROPOFOL ;  Surgeon: Gladis RAYMOND Mariner, MD;  Location: [facility] ENDOSCOPY;  Service: Endoscopy;  Laterality: N/A;   COLONOSCOPY WITH PROPOFOL  N/A 01/11/2018   Procedure: COLONOSCOPY WITH PROPOFOL ;  Surgeon: Mariner Gladis RAYMOND, MD;  Location: [facility] ENDOSCOPY;  Service: Endoscopy;  Laterality: N/A;   COLONOSCOPY WITH PROPOFOL  N/A 04/24/2018   Procedure: COLONOSCOPY WITH PROPOFOL ;  Surgeon: Mariner Gladis RAYMOND, MD;  Location: [facility] ENDOSCOPY;  Service: Endoscopy;  Laterality: N/A;   COLONOSCOPY WITH PROPOFOL  N/A 11/19/2019   Procedure: COLONOSCOPY WITH PROPOFOL ;  Surgeon: Jinny Carmine, MD;  Location: [facility] ENDOSCOPY;  Service: Endoscopy;  Laterality: N/A;   CORONARY ANGIOPLASTY WITH STENT PLACEMENT  2008   CORONARY STENT INTERVENTION N/A 03/24/2020   Procedure: CORONARY STENT INTERVENTION;  Surgeon: Mady Bruckner, MD;  Location: [facility] INVASIVE CV LAB;  Service: Cardiovascular;  Laterality: N/A;   ESOPHAGOGASTRODUODENOSCOPY (EGD)  WITH PROPOFOL  N/A 12/30/2014   Procedure: ESOPHAGOGASTRODUODENOSCOPY (EGD) WITH PROPOFOL ;  Surgeon: Gladis RAYMOND Mariner, MD;  Location: [facility] ENDOSCOPY;  Service: Endoscopy;  Laterality: N/A;   ESOPHAGOGASTRODUODENOSCOPY (EGD) WITH PROPOFOL  N/A 07/19/2016   Procedure: ESOPHAGOGASTRODUODENOSCOPY (EGD) WITH PROPOFOL ;  Surgeon: Mariner Gladis RAYMOND, MD;  Location: [facility]COPY;  Service: Endoscopy;  Laterality: N/A;   ESOPHAGOGASTRODUODENOSCOPY (EGD) WITH PROPOFOL  N/A 04/24/2018    Procedure: ESOPHAGOGASTRODUODENOSCOPY (EGD) WITH PROPOFOL ;  Surgeon: Mariner Gladis RAYMOND, MD;  Location: [facility] ENDOSCOPY;  Service: Endoscopy;  Laterality: N/A;   EYE SURGERY  cataracts, detached retina   hysterectomy (other)     LEFT HEART CATH AND CORONARY ANGIOGRAPHY N/A 03/24/2020   Procedure: LEFT HEART CATH AND CORONARY ANGIOGRAPHY;  Surgeon: Mady Bruckner, MD;  Location: [facility] INVAS[facility] CV LAB;  Service: Cardiovascular;  Laterality: N/A;   Patient Active Problem List   Diagnosis Date Noted   Stroke (HCC) 08/05/2023   Depression with anxiety 08/05/2023   Overweight (BMI 25.0-29.9) 08/05/2023   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) 03/17/2023   Dizziness 02/23/2023   Restrictive airway disease 07/21/2020   Partial thickness rotator cuff tear 03/20/2020   Shoulder pain 03/05/2020   Ataxia 02/28/2020   Difficulty walking 02/28/2020   Physical deconditioning 02/28/2020   Chronic diastolic congestive heart failure (HCC) 11/20/2019   Polyp of ascending colon    Hypokalemia 08/08/2019   TIA (transient ischemic attack) 08/01/2019   Benign hypertensive kidney disease with chronic kidney disease 04/25/2019   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/24/2018   Chronic, continuous use of opioids 03/26/2018   History of adenomatous polyp of colon 01/22/2018   Diverticulosis of colon 01/22/2018   History of CVA (cerebrovascular accident) 11/08/2016   Weakness of left upper extremity 10/30/2016   AAA (abdominal aortic aneurysm) without rupture 08/12/2016   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   Stage 3b chronic kidney disease (HCC) 07/27/2015   Type 2 diabetes mellitus with diabetic chronic kidney disease (HCC) 10/28/2014   Solitary pulmonary nodule on lung CT 08/20/2014   Allergic rhinitis 07/28/2014   Acid reflux 07/28/2014   Chronic obstructive pulmonary disease (HCC) 07/28/2014   Granuloma annulare    Back pain 11/12/2013   Lumbar scoliosis 10/30/2013    Lumbar radiculopathy 10/30/2013   Lumbar canal stenosis 10/30/2013   Proteinuria 10/22/2013   Smokes with greater than 30 pack year history 03/21/2011   Edema 08/18/2010   Hyperlipidemia 03/25/2009   Coronary artery disease 03/25/2009   Primary hypertension 03/25/2009    ONSET DATE: 08/05/23  REFERRING DIAG:  Z86.73 (ICD-10-CM) - History of CVA (cerebrovascular accident)  R27.0 (ICD-10-CM) - Ataxia    THERAPY DIAG:   Aphasia  Apraxia of speech  Cognitive communication deficit  Muscle weakness (generalized)  Other lack of coordination  Unsteadiness on feet  Dizziness and giddiness  Abnormality of gait and mobility  Difficulty in walking, not elsewhere classified  Other abnormalities of gait and mobility  Rationale for Evaluation and Treatment: Rehabilitation  SUBJECTIVE:  SUBJECTIVE STATEMENT:  Pt reports still having pain everyday, mostly in the back.  Pt reports she has not gotten up and walked at all today.  Pt accompanied by: self and family member, daughter  PERTINENT HISTORY:  PMH: of CVA, COPD, Dysarthria, Ataxia, TIA, T2DM, Back pain, scoliosis   PAIN:  Are you having pain? Yes: NPRS scale: 3/10 Pain location: low back and L hip Pain description: achy Aggravating factors: just hurts all the time. Relieving factors: hydrocodone  at night  PRECAUTIONS: Other: Pt has a looper to see if pt has a-fib  RED FLAGS: Abdominal aneurysm: Yes: pt reports having an abdominal aneurism as found by Dr. Marea.   WEIGHT BEARING RESTRICTIONS: No  FALLS: Has patient fallen in last 6 months? Yes. Number of falls 1.  Pt reports losing balance, and got caught up in cords that were on the floor when she was trying to close the blinds.  LIVING ENVIRONMENT: Lives with: lives with their  spouse Lives in: House/apartment Stairs: Yes: External: 1 steps; none Has following equipment at home: Single point cane, Walker - 2 wheeled, Environmental Consultant - 4 wheeled, shower chair, and Grab bars  PLOF: Independent  PATIENT GOALS: Pt is wanting to gain independence.  Pt is wanting to improve endurance levels and improving speech with SLP.  OBJECTIVE:  Note: Objective measures were completed at Evaluation unless otherwise noted.  DIAGNOSTIC FINDINGS:   EXAM: MRI HEAD WITHOUT CONTRAST   MRA HEAD WITHOUT CONTRAST   MRA NECK WITHOUT AND WITH CONTRAST   IMPRESSION: 1. Small acute infarcts in the left frontal lobe, right parietal lobe, and left cerebellum. 2. Extensive chronic small vessel ischemic disease. 3. 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 4. Unchanged severe right A3 stenosis or segmental occlusion. 5. Moderate left P2 stenosis. 6. Limited assessment of the left vertebral artery origin, otherwise negative neck MRA.   COGNITION: Overall cognitive status: Within functional limits for tasks assessed   SENSATION: WFL, however pt notes having some numbness in the R hand.  COORDINATION: WFL  POSTURE: rounded shoulders, forward head, decreased lumbar lordosis, increased thoracic kyphosis, posterior pelvic tilt, and flexed trunk    LOWER EXTREMITY MMT:    MMT Right Eval Left Eval  Hip flexion 4 4  Hip extension    Hip abduction 4 4-  Hip adduction 4 4-  Hip internal rotation    Hip external rotation    Knee flexion 4 4-  Knee extension 4 4-  Ankle dorsiflexion 4 4-  Ankle plantarflexion    Ankle inversion    Ankle eversion    (Blank rows = not tested)  BED MOBILITY:  Not tested  FUNCTIONAL TESTS:  5 times sit to stand: 24.36 sec Timed up and go (TUG): 23.94 sec with walker 2 minute walk test: TBD 10 meter walk test: 20.33 sec with walker Berg Balance Scale: TBD  PATIENT SURVEYS:   Stroke Impact Scale 16 (Copyrighted instrument, [facility])  In the past 2 weeks, how difficult was it to...  Rating Scale 5 = Not difficult at all 4 = A little difficult 3 = Somewhat difficult 2 = Very difficult 1 = Could not do at all  a. Dress the top part of your body? 4  b. Bathe yourself? 2  c. Get to the toilet on time? 5  d. Control your bladder (not have an accident)? 5  e. Control your bowels (not have an accident)? 5  f. Stand without losing  balance? 3  g. Go shopping? 2  h. Do heavy household chores (e.g. vacuum, laundry or  yard work)? 1  i. Stay sitting without losing your balance? 3  j. Walk without losing your balance? 2  k. Move from a bed to a chair? 3  l. Walk fast? 1  m. Climb one flight of stairs? 1  n. Walk one block? 1  o. Get in and out of a car? 2  p. Carry heavy objects (e.g. bag of groceries) with your  affected hand? 1  Sum:  41/80  MDC (Minimal Detectable Change) is >/=8                                                                                                                                 TREATMENT DATE: 12/21/23   Pt arrived to session in transport chair, daughter present.   SpO2: 94-96% upon arrival to the clinic with room air; seated.   SpO2 remained >90% throughout the entirety of the session   TherAct: To improve functional movements patterns for everyday tasks  Circuit Training  Attempt #1: Ambulation around the gym with use of rollator, 300', on room air, 91% during and 93% upon sitting  90 sec rest break  Attempt #2: Ambulation around the gym with use of rollator, 300', on room air, 92-94% during and 93% upon sitting  60 sec rest break  Attempt #3: Ambulation around the gym with use of rollator, 300', on room air, 86-90% during and 93% upon sitting  Practical application of pt performing tasks within the kitchen, such as unloading a few dishes, pulling items from the fridge, placing items into the oven, and washing hands during cooking.     Ambulation to SLP appointment with use of rollator   Therapeutic rest breaks utilized in between each attempt and vitals monitored, throughout, specifically with SpO2.  PATIENT EDUCATION: Education details: Pt educated on role of PT and services provided during current POC, along with prognosis and information about the clinic.  Person educated: Patient and Child(ren) Education method: Explanation Education comprehension: verbalized understanding and returned demonstration  HOME EXERCISE PROGRAM:  Access Code: [email or web address] URL: [email or web address] Date: 10/17/2023 Prepared by: Sidra Simpers  Exercises - Standing March with Counter Support  - 1 x daily - 3-4 x weekly - 3 sets - 10 reps - Standing Knee Flexion with Unilateral Counter Support  - 1 x daily - 7 x weekly - 3 sets - 10 reps - Standing Hip Extension with Unilateral Counter Support  - 1 x daily - 7 x weekly - 3 sets - 10 reps - Standing Hip Abduction with Unilateral Counter Support  - 1 x daily - 7 x weekly - 3 sets - 10 reps - Heel Toe Raises with Unilateral Counter Support  - 1 x daily - 7 x weekly - 3 sets - 10 reps  GOALS: Goals reviewed with patient? Yes  SHORT TERM GOALS: Target  date: 11/15/2023  Pt will be independent with HEP in order to demonstrate increased ability to perform tasks related to occupation/hobbies. Baseline: pt given HEP at eval Goal status: INITIAL  LONG TERM GOALS: Target date: 01/10/2024  1.  Patient (> 17 years old) will complete five times sit to stand test in < 15 seconds indicating an increased LE strength and improved balance. Baseline: 24.36 sec Goal status: INITIAL  2.  Patient will improve SIS 16 score to 55   to demonstrate statistically significant improvement in mobility and quality of life as it relates to their CVA.  Baseline: 41 Goal status: INITIAL   3.  Patient will increase Berg Balance score by > 6 points to demonstrate decreased fall risk during  functional activities. Baseline: 33/56 Goal status: INITIAL   4.  Patient will reduce timed up and go to <11 seconds to reduce fall risk and demonstrate improved transfer/gait ability. Baseline: 23.94 sec with walker Goal status: INITIAL  5.  Patient will increase 10 meter walk test to >1.47m/s as to improve gait speed for better community ambulation and to reduce fall risk. Baseline: 20.33 sec with walker Goal status: INITIAL  6.  Patient will increase 2 minute walk test distance by 50 ft or greater for progression to community ambulator and demonstrate improved gait ability  Baseline: 275'; 83.82 m Goal status: INITIAL     ASSESSMENT:  CLINICAL IMPRESSION:  Pt performed well today, with O2 saturation levels remaining >90% throughout, and even improving with gait rather than at rest.  Pt also was able to demonstrate good technique and endurance with household chores in the AD[employer] kitchen.  Pt appreciative of the   Pt performed well with the exercises given and put forth great effort throughout the session.  Pt noted to have improved breathing technique and was able to keep SpO2 >90% on room air today.  Pt continued with circuit training and responded well to it.  At the conclusion of the session, pt was transported to speech therapy and was given MHP to place on the low back for pain modulation.   Pt will continue to benefit from skilled therapy to address remaining deficits in order to improve overall QoL and return to PLOF.    Will continue to monitor O2 at subsequent sessions to see if pt is able to tolerate ambulation without supplemental O2.    OBJECTIVE IMPAIRMENTS: Abnormal gait, decreased activity tolerance, decreased balance, decreased endurance, decreased knowledge of use of DME, decreased mobility, difficulty walking, decreased strength, impaired sensation, and pain.   ACTIVITY LIMITATIONS: carrying, lifting, bending, standing, squatting, stairs, transfers, bathing,  toileting, dressing, hygiene/grooming, and locomotion level  PARTICIPATION LIMITATIONS: meal prep, cleaning, laundry, community activity, and yard work  PERSONAL FACTORS: Age and 3+ comorbidities:  PMH: of CVA, COPD, Dysarthria, Ataxia, TIA, T2DM, Back pain, scoliosis are also affecting patient's functional outcome.   REHAB POTENTIAL: Good  CLINICAL DECISION MAKING: Evolving/moderate complexity  EVALUATION COMPLEXITY: Moderate  PLAN:  PT FREQUENCY: 2x/week  PT DURATION: 12 weeks  PLANNED INTERVENTIONS: 97750- Physical Performance Testing, 97110-Therapeutic exercises, 97530- Therapeutic activity, 97112- Neuromuscular re-education, 97535- Self Care, [id number]- Manual therapy, [phone]- Gait training, Balance training, and Vestibular training  PLAN FOR NEXT SESSION:   Monitor SPO2 and get portable oxygen  if necessary  Endurance, balance, strength ( L>R), updated HEP, gait training  -Limiting seated rest breaks as tolerated to challenge endurance   Fonda Simpers, PT, DPT Physical Therapist - [facility]  [facility]  12/21/23,  2:49 PM

## 2023-12-24 ENCOUNTER — Ambulatory Visit: Payer: Self-pay | Admitting: Cardiology

## 2023-12-25 ENCOUNTER — Ambulatory Visit

## 2023-12-25 ENCOUNTER — Encounter

## 2023-12-25 NOTE — Progress Notes (Deleted)
 Cardiology Clinic Note   Date: 12/25/2023 ID: Hannah Kirby, DOB 09/14/1945, MRN [id number]  Primary Cardiologist:  Evalene Lunger, MD  Chief Complaint   Hannah Kirby is a 78 y.o. female who presents to the clinic today for ***  Patient Profile   Hannah Kirby is followed by *** for the history outlined below.      Past medical history significant for: CAD. PCI with BMS x 2 to RCA January 2008. LHC 03/24/2020 (NSTEMI): Severe single-vessel CAD with 99% distal RCA stenosis.  Mild, nonobstructive CAD with minimal luminal irregularities involving the LAD and 20 to 30% ostial ramus stenosis.  Patent proximal/mid RCA stents with mild to moderate in-stent restenosis (~30%).  Mildly elevated LVEDP.  PCI with DES 3 x 15 mm to distal RCA. Chronic diastolic heart failure. Echo with bubble study 03/22/2023: EF 60 to 65%.  No RWMA.  Moderate LVH.  Grade I DD.  Normal RV size/function.  Mild aortic valve sclerosis/calcification without stenosis.  Bubble study negative without evidence of interatrial shunt.*** Echo 08/06/2023: EF 65 to 70%.  Regional wall motion unable to be evaluated.  Mild LVH.  Grade I DD.  Normal RV size/function.  Aortic valve sclerosis/calcification without stenosis. AAA. AAA duplex 02/11/2022: Abnormal dilatation of proximal abdominal aorta, 3.8 cm.  Prior ultrasound with measurement 3.5 cm December 2022. Carotid artery disease. Carotid ultrasound 03/18/2023: Mild amount of calcified plaque at the level of both carotid bulbs and proximal internal carotid arteries.  Bilateral ICA stenosis <50%. Hypertension. Hyperlipidemia. Lipid panel 08/06/2023: LDL 46, HDL 48, TG 244, total 143.  COPD. CVA. 2018 and 2021. MRI brain 03/17/2023: Punctate foci of restricted diffusion in the left posterior frontal white matter, most likely acute infarcts. Numerous foci of hemosiderin deposition, which are seen in the deep gray structures but also in the bilateral cerebral hemispheres,  with 1 new focus compared to 02/20/2023. While this could be the sequela of chronic hypertensive microhemorrhages, this appearance is concerning for cerebral amyloid angiopathy. MRA head 03/18/2023: Strong evidence of severe stenosis of the distal left MCA M1 segment.  Left MCA bifurcation appears patent bedside.  No discrete branch occlusion when compared to 2018.  Positive also for severe stenosis or occlusion of the right ACA in the distal A2/A3 segment new since 2018.  Stable MRA elsewhere no other intracranial artery stenosis identified. 30-day ZIO March 2025: HR 56 to 169 bpm, average 70 bpm.  1 run of NSVT 5 beats max rate 128 bpm.  2 runs of SVT lasting 5 beats max rate 169 bpm.  Rare ectopy.  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 10-day ZIO 08/31/2023: HR 50 to 136 bpm, average 67 bpm, 1 run of NSVT lasting 6 beats.  No A-fib. Rare ventricular ectopy. No sustained arrhythmias. Loop recorder implantation 08/17/2023. Remote device check 12/20/2023: Normal device function.  No new symptoms, tacky, bradycardia, pauses or AF episodes.  Battery status okay.   T2DM. CKD stage IIIb. Tobacco abuse.  In summary, Hannah Kirby is a longtime patient of Dr. Gollan.  She has a history of CAD with PCI with BMS x 2 to RCA in January 2008.  Nuclear stress testing June 2016 and October 2019 were normal, low risk studies.  She has a history of mild carotid artery disease.  She undergoes yearly ultrasound for surveillance of AAA with last measurement 3.8 cm in December 2023.  In February 2022 she underwent LHC in the setting of NSTEMI and PCI was performed to  RCA as detailed above.  Echo at that time showed EF 60 to 65%, no RWMA, moderate LVH, Grade I DD, normal RV size/function, mild aortic valve sclerosis without stenosis.  Patient was last seen in the office by Dr. Gollan on 08/16/2022 for routine follow-up and preoperative risk assessment prior to lumbar epidural steroid injection.  She  was doing well at that time with no cardiac complaints.  Patient presented to [facility] ED on 03/17/2023 with complaints of slurred speech, trouble with words, unsteady gait that began 2 days prior.  Initial labs: WBC 7.6, hemoglobin 17.7, sodium 142, potassium 3.7, creatinine 1.41, BUN 17, respiratory panel negative for COVID/flu/RSV.  Troponin negative x 2.  Chest x-ray without active cardiopulmonary disease.  MRI brain showed punctate foci of restricted diffusion in the left posterior frontal white matter most likely acute infarcts (further details above).  Patient was discharged on 03/18/2023 to follow-up with PCP and neurology as an outpatient.  She was instructed to follow-up with cardiology to get an echo to complete stroke workup.  She was seen in follow-up with no complaints.  30-day monitor revealed no sustained arrhythmias.  Patient was last seen in the office by me on 06/26/2023 for follow-up after testing.  She continued to work with outpatient speech therapy improvement.  She completed in-home PT and was awaiting for referral for outpatient physical therapy as patient still had generalized weakness more prominent on the left side.  No medication changes were made.  Patient presented to the ED on 08/05/2023 for increased weakness. MRI showed small acute infarcts in the left frontal lobe, right parietal lobe and left cerebellum.  Echo demonstrated normal LV/RV function. She was discharged on 6/16 to follow up with neurology. She was referred to EP for loop recorder implantation which was performed on 08/17/2023.      History of Present Illness    Today, patient ***  CAD S/p PCI with BMS x 2 to RCA January 2018, PCI with DES to distal RCA February 2022.  Patient denies chest pain, pressure or tightness. *** -Continue aspirin , Plavix , rosuvastatin , Zetia , metoprolol , as needed SL NTG.   Chronic diastolic heart failure Echo June 2025 showed normal LV/RV function, mild LVH, Grade I DD, aortic valve  sclerosis without stenosis.  Patient denies lower extremity edema, worsened shortness of breath, orthopnea or PND. She is on 3 L continuous supplemental O2. Breath sounds diminished bilaterally otherwise euvolemic and well compensated on exam. *** -Continue Farxiga , lisinopril , metoprolol .   Hypertension BP today ***. No reports of headaches or dizziness.  -Continue lisinopril , metoprolol .   Hyperlipidemia LDL 46 June 2025. -Continue rosuvastatin  and Zetia .   CVA/Left sided weakness 2018, 2021, January 2025.  Most recent CVA June 2025.  Loop recorder implantation June 2025. Last remote device check without sustained arrhythmias.  Patient *** - Continue aspirin , Plavix , rosuvastatin , Zetia . - Continue to follow with neurology.  ROS: All other systems reviewed and are otherwise negative except as noted in History of Present Illness.  EKGs/Labs Reviewed        11/04/2023: ALT 14; AST 22; BUN 19; Creatinine, Ser 1.42; Potassium 4.2; Sodium 140   11/04/2023: Hemoglobin 16.8; WBC 9.3   No results found for requested labs within last 365 days.   08/05/2023: B Natriuretic Peptide 117.0  ***  Risk Assessment/Calculations    {Does this patient have ATRIAL FIBRILLATION?:[id number]} No BP recorded.  {Refresh Note OR Click here to enter BP  :1}***        Physical Exam  VS:  There were no vitals taken for this visit. , BMI There is no height or weight on file to calculate BMI.  GEN: Well nourished, well developed, in no acute distress. Neck: No JVD or carotid bruits. Cardiac: *** RRR. *** No murmur. No rubs or gallops.   Respiratory:  Respirations regular and unlabored. Clear to auscultation without rales, wheezing or rhonchi. GI: Soft, nontender, nondistended. Extremities: Radials/DP/PT 2+ and equal bilaterally. No clubbing or cyanosis. No edema ***  Skin: Warm and dry, no rash. Neuro: Strength intact.  Assessment & Plan   ***  Disposition: ***     {Are you ordering a CV  Procedure (e.g. stress test, cath, DCCV, TEE, etc)?   Press F2        :[id number]}   Signed, Barnie HERO. Cherokee Clowers, DNP, NP-C

## 2023-12-26 ENCOUNTER — Ambulatory Visit: Admitting: Physical Therapy

## 2023-12-26 ENCOUNTER — Encounter

## 2023-12-26 NOTE — Progress Notes (Signed)
 Remote Loop Recorder Transmission

## 2023-12-27 ENCOUNTER — Ambulatory Visit

## 2023-12-27 DIAGNOSIS — H6121 Impacted cerumen, right ear: Secondary | ICD-10-CM | POA: Diagnosis not present

## 2023-12-27 DIAGNOSIS — R42 Dizziness and giddiness: Secondary | ICD-10-CM | POA: Diagnosis not present

## 2023-12-28 ENCOUNTER — Encounter

## 2023-12-28 ENCOUNTER — Ambulatory Visit: Admitting: Physical Therapy

## 2023-12-29 ENCOUNTER — Other Ambulatory Visit: Payer: Self-pay | Admitting: Family Medicine

## 2023-12-29 DIAGNOSIS — I10 Essential (primary) hypertension: Secondary | ICD-10-CM

## 2023-12-29 NOTE — Telephone Encounter (Signed)
 Copied from CRM [id number]. Topic: Clinical - Medication Refill >> Dec 29, 2023 11:55 AM Wess RAMAN wrote: Medication: felodipine  (PLENDIL ) 5 MG 24 hr tablet   Has the patient contacted their pharmacy? No (Agent: If no, request that the patient contact the pharmacy for the refill. If patient does not wish to contact the pharmacy document the reason why and proceed with request.) (Agent: If yes, when and what did the pharmacy advise?)  This is the patient's preferred pharmacy:   [facility] [id number] [place], [place] - [address] [place] [address] Phone: [phone] Fax: [phone]  Is this the correct pharmacy for this prescription? Yes If no, delete pharmacy and type the correct one.   Has the prescription been filled recently? Yes  Is the patient out of the medication? Yes  Has the patient been seen for an appointment in the last year OR does the patient have an upcoming appointment? Yes  Can we respond through MyChart? No  Agent: Please be advised that Rx refills may take up to 3 business days. We ask that you follow-up with your pharmacy.

## 2024-01-01 ENCOUNTER — Ambulatory Visit

## 2024-01-01 ENCOUNTER — Telehealth: Payer: Self-pay | Admitting: Family Medicine

## 2024-01-01 ENCOUNTER — Ambulatory Visit: Attending: Family Medicine

## 2024-01-01 DIAGNOSIS — R4701 Aphasia: Secondary | ICD-10-CM | POA: Diagnosis not present

## 2024-01-01 DIAGNOSIS — R2689 Other abnormalities of gait and mobility: Secondary | ICD-10-CM

## 2024-01-01 DIAGNOSIS — R2681 Unsteadiness on feet: Secondary | ICD-10-CM | POA: Insufficient documentation

## 2024-01-01 DIAGNOSIS — R278 Other lack of coordination: Secondary | ICD-10-CM | POA: Diagnosis not present

## 2024-01-01 DIAGNOSIS — M6281 Muscle weakness (generalized): Secondary | ICD-10-CM | POA: Diagnosis not present

## 2024-01-01 DIAGNOSIS — R269 Unspecified abnormalities of gait and mobility: Secondary | ICD-10-CM

## 2024-01-01 DIAGNOSIS — I69890 Apraxia following other cerebrovascular disease: Secondary | ICD-10-CM | POA: Diagnosis not present

## 2024-01-01 DIAGNOSIS — R482 Apraxia: Secondary | ICD-10-CM | POA: Insufficient documentation

## 2024-01-01 DIAGNOSIS — R41841 Cognitive communication deficit: Secondary | ICD-10-CM | POA: Diagnosis not present

## 2024-01-01 NOTE — Telephone Encounter (Signed)
 Too soon for refill.  Requested Prescriptions  Pending Prescriptions Disp Refills   felodipine  (PLENDIL ) 5 MG 24 hr tablet 90 tablet 3    Sig: Take 1 tablet (5 mg total) by mouth daily.     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)   [facility] Gasper [name] BRAVO, MD   4 months ago History of CVA (cerebrovascular accident)   [facility] Gasper [name] BRAVO, MD   5 months ago Bronchitis   [facility] Gasper [name] BRAVO, MD   6 months ago Acute bronchitis with COPD [facility])   [facility] Simmons-Robinson, Rockie, MD       Future Appointments             In 1 week Wittenborn, Barnie, NP [facility]

## 2024-01-01 NOTE — Telephone Encounter (Unsigned)
 Copied from CRM [id number]. Topic: Appointments - Scheduling Inquiry for Clinic >> Jan 01, 2024  3:34 PM Tiffini S wrote: Reason for CRM: Patient daughter Kenyette Gundy called asking to have the patient worked in for a scheduled appointment for 01/02/24, 01/04/24 or 01/05/24- blood pressure readings are up and down-  she is having  weakness, balance issues   Offered to schedule appointments for 11/12, 11/19 and 11/21 with Dr. Gasper- daughter refused stating that they are not available for those dates  Offered to schedule with a different provider- patient daughter refused  Please call the patient daughter at [phone] or [phone]

## 2024-01-01 NOTE — Therapy (Signed)
 OUTPATIENT SPEECH LANGUAGE PATHOLOGY APHASIA TREATMENT   Patient Name: Hannah Kirby MRN: [id number] DOB:[birth year], 78 y.o., female Today's Date: 01/01/2024  PCP: [name] Perry, MD REFERRING PROVIDER: [name] Perry, MD   End of Session - 01/01/24 1508     Visit Number 14    Number of Visits 23    Date for Recertification  01/09/24    Progress Note Due on Visit 20    SLP Start Time 1523    SLP Stop Time  1605    SLP Time Calculation (min) 42 min    Activity Tolerance Patient tolerated treatment well          Past Medical History:  Diagnosis Date   Acute hemorrhagic colitis 08/08/2019   Anemia    C. difficile colitis 08/09/2019   CAD (coronary artery disease)    a. 02/2006 PCI: BMS x 2 to RCA, cath o/w without significant coronary disease; b. nuclear stress test 07/2014: No ischemia/infarct; c. 11/2017 MV: no isch/infarct, EF 55-65%; d. 03/2020 NSTEMI/PCI: LM nl, LAD min irregs, RI 25, small, LCX nl, RCA 30p/m ISR, 99d (3.0x15 Resolute Onyx DES).   Cataract 02/04/2018   Cerebrovascular accident (CVA) (HCC) 03/18/2023   dysarthria   Chronic bronchitis (HCC)    secondary to cigarette smoking   FHx: allergies    Goiter    Granulomatous disease    Hernia    Kidney stone on left side 2013   NSTEMI (non-ST elevated myocardial infarction) (HCC) 03/23/2020   Oxygen  deficiency    Panic attacks    PVC's (premature ventricular contractions)    a. 03/2018 Zio: Occas PVCs (2.5%). Triggered events assoc w/ PVC/PAC.   Retinal detachment, left 03/04/2020   Retinal tear 2020   Stroke (HCC) 10/29/2016   mild left side weakness   Stroke (HCC) 08/01/2019   Tobacco abuse    Past Surgical History:  Procedure Laterality Date   ABDOMINAL HYSTERECTOMY     BREAST SURGERY     CATARACT EXTRACTION W/PHACO Right 03/01/2017   Procedure: CATARACT EXTRACTION PHACO AND INTRAOCULAR LENS PLACEMENT (IOC) RIGHT DIABETIC;  Surgeon: Mittie Gaskin, MD;  Location: [facility];   Service: Ophthalmology;  Laterality: Right;   CATARACT EXTRACTION W/PHACO Left 03/22/2017   Procedure: CATARACT EXTRACTION PHACO AND INTRAOCULAR LENS PLACEMENT (IOC) LEFT DIABETIC;  Surgeon: Mittie Gaskin, MD;  Location: [facility];  Service: Ophthalmology;  Laterality: Left;  Diabetic - insulin  and oral meds   COLONOSCOPY WITH PROPOFOL  N/A 09/23/2014   Procedure: COLONOSCOPY WITH PROPOFOL ;  Surgeon: Gladis RAYMOND Mariner, MD;  Location: [facility] ENDOSCOPY;  Service: Endoscopy;  Laterality: N/A;   COLONOSCOPY WITH PROPOFOL  N/A 01/11/2018   Procedure: COLONOSCOPY WITH PROPOFOL ;  Surgeon: Mariner Gladis RAYMOND, MD;  Location: [facility] ENDOSCOPY;  Service: Endoscopy;  Laterality: N/A;   COLONOSCOPY WITH PROPOFOL  N/A 04/24/2018   Procedure: COLONOSCOPY WITH PROPOFOL ;  Surgeon: Mariner Gladis RAYMOND, MD;  Location: [facility] ENDOSCOPY;  Service: Endoscopy;  Laterality: N/A;   COLONOSCOPY WITH PROPOFOL  N/A 11/19/2019   Procedure: COLONOSCOPY WITH PROPOFOL ;  Surgeon: Jinny Carmine, MD;  Location: [facility] ENDOSCOPY;  Service: Endoscopy;  Laterality: N/A;   CORONARY ANGIOPLASTY WITH STENT PLACEMENT  2008   CORONARY STENT INTERVENTION N/A 03/24/2020   Procedure: CORONARY STENT INTERVENTION;  Surgeon: Mady Bruckner, MD;  Location: [facility] INVASIVE CV LAB;  Service: Cardiovascular;  Laterality: N/A;   ESOPHAGOGASTRODUODENOSCOPY (EGD) WITH PROPOFOL  N/A 12/30/2014   Procedure: ESOPHAGOGASTRODUODENOSCOPY (EGD) WITH PROPOFOL ;  Surgeon: Gladis RAYMOND Mariner, MD;  Location: [facility] ENDOSCOPY;  Service: Endoscopy;  Laterality: N/A;   ESOPHAGOGASTRODUODENOSCOPY (EGD) WITH PROPOFOL  N/A 07/19/2016   Procedure: ESOPHAGOGASTRODUODENOSCOPY (EGD) WITH PROPOFOL ;  Surgeon: Gaylyn Gladis PENNER, MD;  Location: [facility] ENDOSCOPY;  Service: Endoscopy;  Laterality: N/A;   ESOPHAGOGASTRODUODENOSCOPY (EGD) WITH PROPOFOL  N/A 04/24/2018   Procedure: ESOPHAGOGASTRODUODENOSCOPY (EGD) WITH PROPOFOL ;  Surgeon: Gaylyn Gladis PENNER, MD;  Location: [facility]  ENDOSCOPY;  Service: Endoscopy;  Laterality: N/A;   EYE SURGERY  cataracts, detached retina   hysterectomy (other)     LEFT HEART CATH AND CORONARY ANGIOGRAPHY N/A 03/24/2020   Procedure: LEFT HEART CATH AND CORONARY ANGIOGRAPHY;  Surgeon: Mady Bruckner, MD;  Location: [facility] INVASIVE CV LAB;  Service: Cardiovascular;  Laterality: N/A;   Patient Active Problem List   Diagnosis Date Noted   Stroke (HCC) 08/05/2023   Depression with anxiety 08/05/2023   Overweight (BMI 25.0-29.9) 08/05/2023   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) 03/17/2023   Dizziness 02/23/2023   Restrictive airway disease 07/21/2020   Partial thickness rotator cuff tear 03/20/2020   Shoulder pain 03/05/2020   Ataxia 02/28/2020   Difficulty walking 02/28/2020   Physical deconditioning 02/28/2020   Chronic diastolic congestive heart failure (HCC) 11/20/2019   Polyp of ascending colon    Hypokalemia 08/08/2019   TIA (transient ischemic attack) 08/01/2019   Benign hypertensive kidney disease with chronic kidney disease 04/25/2019   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/24/2018   Chronic, continuous use of opioids 03/26/2018   History of adenomatous polyp of colon 01/22/2018   Diverticulosis of colon 01/22/2018   History of CVA (cerebrovascular accident) 11/08/2016   Weakness of left upper extremity 10/30/2016   AAA (abdominal aortic aneurysm) without rupture 08/12/2016   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   Stage 3b chronic kidney disease (HCC) 07/27/2015   Type 2 diabetes mellitus with diabetic chronic kidney disease (HCC) 10/28/2014   Solitary pulmonary nodule on lung CT 08/20/2014   Allergic rhinitis 07/28/2014   Acid reflux 07/28/2014   Chronic obstructive pulmonary disease (HCC) 07/28/2014   Granuloma annulare    Back pain 11/12/2013   Lumbar scoliosis 10/30/2013   Lumbar radiculopathy 10/30/2013   Lumbar canal stenosis 10/30/2013   Proteinuria 10/22/2013   Smokes  with greater than 30 pack year history 03/21/2011   Edema 08/18/2010   Hyperlipidemia 03/25/2009   Coronary artery disease 03/25/2009   Primary hypertension 03/25/2009    ONSET DATE: 03/17/23 (admitted with stroke), 05/23/23 (referral date)  REFERRING DIAG:  R41.841 (ICD-10-CM) - Cognitive communication deficit  I63.9 (ICD-10-CM) - CVA (cerebral vascular accident) (HCC)  R47.02 (ICD-10-CM) - Dysphasia    THERAPY DIAG:  Aphasia  Apraxia of speech  Rationale for Evaluation and Treatment Rehabilitation  SUBJECTIVE:   SUBJECTIVE STATEMENT: Pt alert, pleasant, and cooperative.  Pt accompanied by: self and family member  PERTINENT HISTORY:  Pt is a 78 y.o. female who presents for communication evaluation in setting on stroke. Pt in ED 1/24-1/25/25. Pt d/c'd home with HH ST. PMHx significant of   HTN, CKD stage IIIB, DMT2 on insulin , CAD, chronic smoking, chronic respiratory failure with COPD and on home oxygen , anxiety, HLD. MRI 03/17/23 1. Punctate foci of restricted diffusion in the left posterior  frontal white matter, most likely acute infarcts.  2. Numerous foci of hemosiderin deposition, which are seen in the  deep gray structures but also in the bilateral cerebral hemispheres,  with 1 new focus compared to 02/20/2023. While this could be the  sequela of chronic hypertensive microhemorrhages, this appearance is  concerning for cerebral amyloid angiopathy.  PAIN:  Are you having pain? No  FALLS: defer to PT  LIVING ENVIRONMENT: Lives with: lives with their spouse Lives in: House/apartment  PLOF:  Level of assistance: Independent with ADLs Employment: Retired   PATIENT GOALS    for communication to improve   OBJECTIVE:  TODAY'S TREATMENT:   Pt provided x2 details per object during SFA task with mod/max.  Pt generated sentences of >3 words with 25% accuracy, improving to 100% with mod/max cues.   Pt repeated 4 word phrases with 50% accuracy improving to  90% with mod/max cues.   Increased difficulty with all tasks this date. Daughter endorsed pt with poor PO intake, reduced sleep, increased dizziness, and blood pressure fluctuation. Speech today was slow, effortful with s/sx apraxia (sequencing errors), anomia, and dysarthria (articulatory imprecision). Pt and daughter encouraged to follow up with physician, pt declining need for ED visit at this time.     PATIENT EDUCATION: Education details: as above Person educated: Patient and Child Education method: Explanation Education comprehension: verbalized understanding and needs further education  HOME EXERCISE PROGRAM:    Use SGD for HEP    GOALS:  Goals reviewed with patient? Yes  SHORT TERM GOALS: Target date: 10 sessions  Pt will participate in further assessment of functional reading/writing. Baseline: Goal status: MET  2.  With Moderate A, patient will complete a semantic feature analysis with at least 2 relevant features for 8/10 target words to improve word-finding skills.  Baseline:  Goal status: PROGRESSING  3.  With Maximal A, patient will generate sentences with 3 or more words in response to a situation at 80% accuracy in order to increase ability to communicate basic wants and needs.  Baseline:  Goal status: PROGRESSING  4.   With Maximal A,  pt will follow 2-step commands for improved participation in ADLs/iADLs with max cueing. Baseline:  Goal status: INITIAL  5.  Pt will repeat short sentences (<4 words) with good approximations 80% of the time with max cueing. Baseline:  Goal status: INITIAL    LONG TERM GOALS: Target date: 12 weeks  Pt will report a subjective improvement in communication per PROM.  Baseline: CES 12/32 on 8/26 Goal status: INITIAL  2.  With Min A, patient/family will demonstrate understanding of the following concepts: aphasia, spontaneous recovery, communication vs conversation, strengths/strategies to promote success, local  resources in order to increase patient's participation in medical care.    Baseline:  Goal status: INITIAL   ASSESSMENT:  CLINICAL IMPRESSION: Pt is a 78 y.o. female who presents for communication treatment in setting on stroke. Pt known to SLP services and was receiving OP ST prior to most recent stroke 08/05/23. Pt d/c'd home with [facility] ST who is working on securing SGD for pt. PMHx significant of  HTN, CKD stage IIIB, DMT2 on insulin , CAD, chronic smoking, chronic respiratory failure with COPD and on home oxygen , anxiety, HLD. Assessment this episode completed via functional/dynamic means including Western Aphasia Battery Revised (WAB-R) and PROM (Communication Effectiveness Scale). 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 See details of tx session above. Recommend course of ST targeting functional communication, further assessment of functional reading/writing, and pt/caregiver training to help promote QoL and overall life participation.   OBJECTIVE IMPAIRMENTS include expressive language, receptive language, and aphasia. These impairments are limiting patient from ADLs/IADLs and effectively communicating at home and in community. Factors affecting potential to achieve goals and functional outcome are  severity of impairments. Patient will benefit from skilled SLP services to address above impairments and improve overall function.  REHAB POTENTIAL: Good  PLAN: SLP FREQUENCY: 2x/week  SLP DURATION: 12 weeks  PLANNED INTERVENTIONS: Language facilitation, Cueing hierachy, Internal/external aids, Functional tasks, Multimodal communication approach, SLP instruction and feedback, Compensatory strategies, and Patient/family education    Delon Bangs, M.S., CCC-SLP Speech-Language Pathologist [facility] [phone] [place])  [place]  Out[facility] Rehabilitation at [facility] [address] [place], [place], [address] Phone: [phone]   Fax:  [phone]

## 2024-01-01 NOTE — Therapy (Signed)
 OUTPATIENT PHYSICAL THERAPY NEURO TREATMENT   Patient Name: Hannah Kirby MRN: [id number] DOB:Dec 29, 1945, 78 y.o., female Today's Date: 01/01/2024   PCP: Gasper [name] BRAVO, MD REFERRING PROVIDER: Gasper [name] BRAVO, MD  END OF SESSION:    PT End of Session - 01/01/24 1421     Visit Number 14    Number of Visits 25    Date for Recertification  01/09/24    Progress Note Due on Visit 10    PT Start Time 1447    PT Stop Time 1513    PT Time Calculation (min) 26 min    Equipment Utilized During Treatment Gait belt    Activity Tolerance Patient tolerated treatment well    Behavior During Therapy WFL for tasks assessed/performed         Past Medical History:  Diagnosis Date   Acute hemorrhagic colitis 08/08/2019   Anemia    C. difficile colitis 08/09/2019   CAD (coronary artery disease)    a. 02/2006 PCI: BMS x 2 to RCA, cath o/w without significant coronary disease; b. nuclear stress test 07/2014: No ischemia/infarct; c. 11/2017 MV: no isch/infarct, EF 55-65%; d. 03/2020 NSTEMI/PCI: LM nl, LAD min irregs, RI 25, small, LCX nl, RCA 30p/m ISR, 99d (3.0x15 Resolute Onyx DES).   Cataract 02/04/2018   Cerebrovascular accident (CVA) (HCC) 03/18/2023   dysarthria   Chronic bronchitis (HCC)    secondary to cigarette smoking   FHx: allergies    Goiter    Granulomatous disease    Hernia    Kidney stone on left side 2013   NSTEMI (non-ST elevated myocardial infarction) (HCC) 03/23/2020   Oxygen  deficiency    Panic attacks    PVC's (premature ventricular contractions)    a. 03/2018 Zio: Occas PVCs (2.5%). Triggered events assoc w/ PVC/PAC.   Retinal detachment, left 03/04/2020   Retinal tear 2020   Stroke (HCC) 10/29/2016   mild left side weakness   Stroke (HCC) 08/01/2019   Tobacco abuse    Past Surgical History:  Procedure Laterality Date   ABDOMINAL HYSTERECTOMY     BREAST SURGERY     CATARACT EXTRACTION W/PHACO Right 03/01/2017   Procedure: CATARACT EXTRACTION PHACO AND  INTRAOCULAR LENS PLACEMENT (IOC) RIGHT DIABETIC;  Surgeon: Mittie Gaskin, MD;  Location: [facility];  Service: Ophthalmology;  Laterality: Right;   CATARACT EXTRACTION W/PHACO Left 03/22/2017   Procedure: CATARACT EXTRACTION PHACO AND INTRAOCULAR LENS PLACEMENT (IOC) LEFT DIABETIC;  Surgeon: Mittie Gaskin, MD;  Location: [facility];  Service: Ophthalmology;  Laterality: Left;  Diabetic - insulin  and oral meds   COLONOSCOPY WITH PROPOFOL  N/A 09/23/2014   Procedure: COLONOSCOPY WITH PROPOFOL ;  Surgeon: Gladis RAYMOND Mariner, MD;  Location: [facility] ENDOSCOPY;  Service: Endoscopy;  Laterality: N/A;   COLONOSCOPY WITH PROPOFOL  N/A 01/11/2018   Procedure: COLONOSCOPY WITH PROPOFOL ;  Surgeon: Mariner Gladis RAYMOND, MD;  Location: [facility] ENDOSCOPY;  Service: Endoscopy;  Laterality: N/A;   COLONOSCOPY WITH PROPOFOL  N/A 04/24/2018   Procedure: COLONOSCOPY WITH PROPOFOL ;  Surgeon: Mariner Gladis RAYMOND, MD;  Location: [facility] ENDOSCOPY;  Service: Endoscopy;  Laterality: N/A;   COLONOSCOPY WITH PROPOFOL  N/A 11/19/2019   Procedure: COLONOSCOPY WITH PROPOFOL ;  Surgeon: Jinny Carmine, MD;  Location: [facility] ENDOSCOPY;  Service: Endoscopy;  Laterality: N/A;   CORONARY ANGIOPLASTY WITH STENT PLACEMENT  2008   CORONARY STENT INTERVENTION N/A 03/24/2020   Procedure: CORONARY STENT INTERVENTION;  Surgeon: Mady Bruckner, MD;  Location: [facility] INVASIVE CV LAB;  Service: Cardiovascular;  Laterality: N/A;   ESOPHAGOGASTRODUODENOSCOPY (EGD)  WITH PROPOFOL  N/A 12/30/2014   Procedure: ESOPHAGOGASTRODUODENOSCOPY (EGD) WITH PROPOFOL ;  Surgeon: Gladis RAYMOND Mariner, MD;  Location: [facility] ENDOSCOPY;  Service: Endoscopy;  Laterality: N/A;   ESOPHAGOGASTRODUODENOSCOPY (EGD) WITH PROPOFOL  N/A 07/19/2016   Procedure: ESOPHAGOGASTRODUODENOSCOPY (EGD) WITH PROPOFOL ;  Surgeon: Mariner Gladis RAYMOND, MD;  Location: [facility] ENDOSCOPY;  Service: Endoscopy;  Laterality: N/A;   ESOPHAGOGASTRODUODENOSCOPY (EGD) WITH PROPOFOL  N/A 04/24/2018    Procedure: ESOPHAGOGASTRODUODENOSCOPY (EGD) WITH PROPOFOL ;  Surgeon: Mariner Gladis RAYMOND, MD;  Location: [facility] ENDOSCOPY;  Service: Endoscopy;  Laterality: N/A;   EYE SURGERY  cataracts, detached retina   hysterectomy (other)     LEFT HEART CATH AND CORONARY ANGIOGRAPHY N/A 03/24/2020   Procedure: LEFT HEART CATH AND CORONARY ANGIOGRAPHY;  Surgeon: Mady Bruckner, MD;  Location: [facility] INVASIVE CV LAB;  Service: Cardiovascular;  Laterality: N/A;   Patient Active Problem List   Diagnosis Date Noted   Stroke (HCC) 08/05/2023   Depression with anxiety 08/05/2023   Overweight (BMI 25.0-29.9) 08/05/2023   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) 03/17/2023   Dizziness 02/23/2023   Restrictive airway disease 07/21/2020   Partial thickness rotator cuff tear 03/20/2020   Shoulder pain 03/05/2020   Ataxia 02/28/2020   Difficulty walking 02/28/2020   Physical deconditioning 02/28/2020   Chronic diastolic congestive heart failure (HCC) 11/20/2019   Polyp of ascending colon    Hypokalemia 08/08/2019   TIA (transient ischemic attack) 08/01/2019   Benign hypertensive kidney disease with chronic kidney disease 04/25/2019   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/24/2018   Chronic, continuous use of opioids 03/26/2018   History of adenomatous polyp of colon 01/22/2018   Diverticulosis of colon 01/22/2018   History of CVA (cerebrovascular accident) 11/08/2016   Weakness of left upper extremity 10/30/2016   AAA (abdominal aortic aneurysm) without rupture 08/12/2016   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   Stage 3b chronic kidney disease (HCC) 07/27/2015   Type 2 diabetes mellitus with diabetic chronic kidney disease (HCC) 10/28/2014   Solitary pulmonary nodule on lung CT 08/20/2014   Allergic rhinitis 07/28/2014   Acid reflux 07/28/2014   Chronic obstructive pulmonary disease (HCC) 07/28/2014   Granuloma annulare    Back pain 11/12/2013   Lumbar scoliosis 10/30/2013    Lumbar radiculopathy 10/30/2013   Lumbar canal stenosis 10/30/2013   Proteinuria 10/22/2013   Smokes with greater than 30 pack year history 03/21/2011   Edema 08/18/2010   Hyperlipidemia 03/25/2009   Coronary artery disease 03/25/2009   Primary hypertension 03/25/2009    ONSET DATE: 08/05/23  REFERRING DIAG:  Z86.73 (ICD-10-CM) - History of CVA (cerebrovascular accident)  R27.0 (ICD-10-CM) - Ataxia    THERAPY DIAG:   Muscle weakness (generalized)  Other lack of coordination  Apraxia of speech  Cognitive communication deficit  Unsteadiness on feet  Aphasia  Abnormality of gait and mobility  Other abnormalities of gait and mobility  Apraxia following other cerebrovascular disease  Rationale for Evaluation and Treatment: Rehabilitation  SUBJECTIVE:  SUBJECTIVE STATEMENT:  Pt reports fall last Wednesday-Fell - hurt her arm changing pee pad -Did not seek Emergency care. OT- Inocente with talking with patient upon arrival and patient's dtr was discussing some acute new impairments going on over past week- more difficulty with speech, feeling tired and weak, and a recent fall after ENT visit.   Reports dizziness  Pt accompanied by: self and family member, daughter  PERTINENT HISTORY:  PMH: of CVA, COPD, Dysarthria, Ataxia, TIA, T2DM, Back pain, scoliosis   PAIN:  Are you having pain? Yes: NPRS scale: 3/10 Pain location: low back and L hip Pain description: achy Aggravating factors: just hurts all the time. Relieving factors: hydrocodone  at night  PRECAUTIONS: Other: Pt has a looper to see if pt has a-fib  RED FLAGS: Abdominal aneurysm: Yes: pt reports having an abdominal aneurism as found by Dr. Marea.   WEIGHT BEARING RESTRICTIONS: No  FALLS: Has patient fallen in last 6 months?  Yes. Number of falls 1.  Pt reports losing balance, and got caught up in cords that were on the floor when she was trying to close the blinds.  LIVING ENVIRONMENT: Lives with: lives with their spouse Lives in: House/apartment Stairs: Yes: External: 1 steps; none Has following equipment at home: Single point cane, Walker - 2 wheeled, Environmental Consultant - 4 wheeled, shower chair, and Grab bars  PLOF: Independent  PATIENT GOALS: Pt is wanting to gain independence.  Pt is wanting to improve endurance levels and improving speech with SLP.  OBJECTIVE:  Note: Objective measures were completed at Evaluation unless otherwise noted.  DIAGNOSTIC FINDINGS:   EXAM: MRI HEAD WITHOUT CONTRAST   MRA HEAD WITHOUT CONTRAST   MRA NECK WITHOUT AND WITH CONTRAST   IMPRESSION: 1. Small acute infarcts in the left frontal lobe, right parietal lobe, and left cerebellum. 2. Extensive chronic small vessel ischemic disease. 3. 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 4. Unchanged severe right A3 stenosis or segmental occlusion. 5. Moderate left P2 stenosis. 6. Limited assessment of the left vertebral artery origin, otherwise negative neck MRA.   COGNITION: Overall cognitive status: Within functional limits for tasks assessed   SENSATION: WFL, however pt notes having some numbness in the R hand.  COORDINATION: WFL  POSTURE: rounded shoulders, forward head, decreased lumbar lordosis, increased thoracic kyphosis, posterior pelvic tilt, and flexed trunk    LOWER EXTREMITY MMT:    MMT Right Eval Left Eval  Hip flexion 4 4  Hip extension    Hip abduction 4 4-  Hip adduction 4 4-  Hip internal rotation    Hip external rotation    Knee flexion 4 4-  Knee extension 4 4-  Ankle dorsiflexion 4 4-  Ankle plantarflexion    Ankle inversion    Ankle eversion    (Blank rows = not tested)  BED MOBILITY:  Not tested  FUNCTIONAL TESTS:  5 times sit to stand: 24.36 sec Timed up and go (TUG):  23.94 sec with walker 2 minute walk test: TBD 10 meter walk test: 20.33 sec with walker Berg Balance Scale: TBD  PATIENT SURVEYS:   Stroke Impact Scale 16 (Copyrighted instrument, [facility])  In the past 2 weeks, how difficult was it to...  Rating Scale 5 = Not difficult at all 4 = A little difficult 3 = Somewhat difficult 2 = Very difficult 1 = Could not do at all  a. Dress the top part of your body? 4  b. Bathe yourself?  2  c. Get to the toilet on time? 5  d. Control your bladder (not have an accident)? 5  e. Control your bowels (not have an accident)? 5  f. Stand without losing balance? 3  g. Go shopping? 2  h. Do heavy household chores (e.g. vacuum, laundry or  yard work)? 1  i. Stay sitting without losing your balance? 3  j. Walk without losing your balance? 2  k. Move from a bed to a chair? 3  l. Walk fast? 1  m. Climb one flight of stairs? 1  n. Walk one block? 1  o. Get in and out of a car? 2  p. Carry heavy objects (e.g. bag of groceries) with your  affected hand? 1  Sum:  41/80  MDC (Minimal Detectable Change) is >/=8                                                                                                                                 TREATMENT DATE: 01/01/24   Pt arrived to session in transport chair, daughter present.   SpO2: 94-96% upon arrival to the clinic with room air; seated.     BP= 144/88 mmHg  Left Sitting BP= 112/81 mmHg Left Standing (asymptomatic per patient)  Rest 5 min and Patient provided water- discussed need to go back to MD secondary to all above mentioned symptoms- dizzy, weak, increased speech impairment, imbalance, Medication questions, and recent fall last week. BP= 137/92 mmHg BP= 118/83  mmHg (unsteady with standing- requiring min A)   SpO2 remained >90% throughout the entirety of the session     PATIENT EDUCATION: Education details: Pt educated on role of PT and services provided  during current POC, along with prognosis and information about the clinic.  Person educated: Patient and Child(ren) Education method: Explanation Education comprehension: verbalized understanding and returned demonstration  HOME EXERCISE PROGRAM:  Access Code: [email or web address] URL: [email or web address] Date: 10/17/2023 Prepared by: Sidra Simpers  Exercises - Standing March with Counter Support  - 1 x daily - 3-4 x weekly - 3 sets - 10 reps - Standing Knee Flexion with Unilateral Counter Support  - 1 x daily - 7 x weekly - 3 sets - 10 reps - Standing Hip Extension with Unilateral Counter Support  - 1 x daily - 7 x weekly - 3 sets - 10 reps - Standing Hip Abduction with Unilateral Counter Support  - 1 x daily - 7 x weekly - 3 sets - 10 reps - Heel Toe Raises with Unilateral Counter Support  - 1 x daily - 7 x weekly - 3 sets - 10 reps  GOALS: Goals reviewed with patient? Yes  SHORT TERM GOALS: Target date: 11/15/2023  Pt will be independent with HEP in order to demonstrate increased ability to perform tasks related to occupation/hobbies. Baseline: pt given HEP at eval Goal status: INITIAL  LONG TERM GOALS: Target date: 01/10/2024  1.  Patient (> 60  years old) will complete five times sit to stand test in < 15 seconds indicating an increased LE strength and improved balance. Baseline: 24.36 sec Goal status: INITIAL  2.  Patient will improve SIS 16 score to 55   to demonstrate statistically significant improvement in mobility and quality of life as it relates to their CVA.  Baseline: 41 Goal status: INITIAL   3.  Patient will increase Berg Balance score by > 6 points to demonstrate decreased fall risk during functional activities. Baseline: 33/56 Goal status: INITIAL   4.  Patient will reduce timed up and go to <11 seconds to reduce fall risk and demonstrate improved transfer/gait ability. Baseline: 23.94 sec with walker Goal status: INITIAL  5.  Patient will increase  10 meter walk test to >1.39m/s as to improve gait speed for better community ambulation and to reduce fall risk. Baseline: 20.33 sec with walker Goal status: INITIAL  6.  Patient will increase 2 minute walk test distance by 50 ft or greater for progression to community ambulator and demonstrate improved gait ability  Baseline: 275'; 83.82 m Goal status: INITIAL     ASSESSMENT:  CLINICAL IMPRESSION: Treatment limited today secondary to patient not feeling well- increased fatigue/weakness with some reported dizziness and recent fall. Checked BP and patient found to be hypotensive. Instructed patient and her dtr to contact her MD to seek further evaluation due to acute complaints. Both verbalized understanding.   Pt will continue to benefit from skilled therapy to address remaining deficits in order to improve overall QoL and return to PLOF.    Will continue to monitor O2 at subsequent sessions to see if pt is able to tolerate ambulation without supplemental O2.    OBJECTIVE IMPAIRMENTS: Abnormal gait, decreased activity tolerance, decreased balance, decreased endurance, decreased knowledge of use of DME, decreased mobility, difficulty walking, decreased strength, impaired sensation, and pain.   ACTIVITY LIMITATIONS: carrying, lifting, bending, standing, squatting, stairs, transfers, bathing, toileting, dressing, hygiene/grooming, and locomotion level  PARTICIPATION LIMITATIONS: meal prep, cleaning, laundry, community activity, and yard work  PERSONAL FACTORS: Age and 3+ comorbidities:  PMH: of CVA, COPD, Dysarthria, Ataxia, TIA, T2DM, Back pain, scoliosis are also affecting patient's functional outcome.   REHAB POTENTIAL: Good  CLINICAL DECISION MAKING: Evolving/moderate complexity  EVALUATION COMPLEXITY: Moderate  PLAN:  PT FREQUENCY: 2x/week  PT DURATION: 12 weeks  PLANNED INTERVENTIONS: 97750- Physical Performance Testing, 97110-Therapeutic exercises, 97530- Therapeutic  activity, 97112- Neuromuscular re-education, 97535- Self Care, [id number]- Manual therapy, [phone]- Gait training, Balance training, and Vestibular training  PLAN FOR NEXT SESSION:  See if patient went to MD - PCP Monitor SPO2 and get portable oxygen  if necessary  Endurance, balance, strength ( L>R), updated HEP, gait training  -Limiting seated rest breaks as tolerated to challenge endurance   Chyrl London, PT Physical Therapist - [facility]  01/01/24, 3:28 PM

## 2024-01-02 ENCOUNTER — Encounter: Payer: Self-pay | Admitting: Physician Assistant

## 2024-01-02 ENCOUNTER — Ambulatory Visit: Admitting: Physical Therapy

## 2024-01-02 ENCOUNTER — Ambulatory Visit: Admission: RE | Admit: 2024-01-02 | Source: Ambulatory Visit

## 2024-01-02 ENCOUNTER — Ambulatory Visit (INDEPENDENT_AMBULATORY_CARE_PROVIDER_SITE_OTHER): Admitting: Physician Assistant

## 2024-01-02 ENCOUNTER — Encounter

## 2024-01-02 VITALS — BP 106/80 | HR 82 | Ht 65.0 in | Wt 159.7 lb

## 2024-01-02 DIAGNOSIS — R5383 Other fatigue: Secondary | ICD-10-CM

## 2024-01-02 DIAGNOSIS — E1122 Type 2 diabetes mellitus with diabetic chronic kidney disease: Secondary | ICD-10-CM | POA: Diagnosis not present

## 2024-01-02 DIAGNOSIS — Z7984 Long term (current) use of oral hypoglycemic drugs: Secondary | ICD-10-CM | POA: Diagnosis not present

## 2024-01-02 DIAGNOSIS — J449 Chronic obstructive pulmonary disease, unspecified: Secondary | ICD-10-CM

## 2024-01-02 DIAGNOSIS — Z794 Long term (current) use of insulin: Secondary | ICD-10-CM

## 2024-01-02 DIAGNOSIS — R42 Dizziness and giddiness: Secondary | ICD-10-CM

## 2024-01-02 DIAGNOSIS — Z7985 Long-term (current) use of injectable non-insulin antidiabetic drugs: Secondary | ICD-10-CM

## 2024-01-02 DIAGNOSIS — I714 Abdominal aortic aneurysm, without rupture, unspecified: Secondary | ICD-10-CM

## 2024-01-02 DIAGNOSIS — I10 Essential (primary) hypertension: Secondary | ICD-10-CM | POA: Diagnosis not present

## 2024-01-02 NOTE — Therapy (Signed)
 OUTPATIENT OCCUPATIONAL THERAPY NEURO TREATMENT NOTE  Patient Name: Hannah Kirby MRN: [id number] DOB:1945-05-27, 78 y.o., female Today's Date: 01/02/2024  PCP: Dr. [name] Perry REFERRING PROVIDER: Dr. [name] Perry  END OF SESSION:  OT End of Session - 01/02/24 1935     Visit Number 9    Number of Visits 24    Date for Recertification  01/22/24    OT Start Time 1417    OT Stop Time 1445    OT Time Calculation (min) 28 min    Equipment Utilized During Treatment transport chair    Activity Tolerance Patient tolerated treatment well    Behavior During Therapy WFL for tasks assessed/performed         Past Medical History:  Diagnosis Date   Acute hemorrhagic colitis 08/08/2019   Anemia    C. difficile colitis 08/09/2019   CAD (coronary artery disease)    a. 02/2006 PCI: BMS x 2 to RCA, cath o/w without significant coronary disease; b. nuclear stress test 07/2014: No ischemia/infarct; c. 11/2017 MV: no isch/infarct, EF 55-65%; d. 03/2020 NSTEMI/PCI: LM nl, LAD min irregs, RI 25, small, LCX nl, RCA 30p/m ISR, 99d (3.0x15 Resolute Onyx DES).   Cataract 02/04/2018   Cerebrovascular accident (CVA) (HCC) 03/18/2023   dysarthria   Chronic bronchitis (HCC)    secondary to cigarette smoking   FHx: allergies    Goiter    Granulomatous disease    Hernia    Kidney stone on left side 2013   NSTEMI (non-ST elevated myocardial infarction) (HCC) 03/23/2020   Oxygen  deficiency    Panic attacks    PVC's (premature ventricular contractions)    a. 03/2018 Zio: Occas PVCs (2.5%). Triggered events assoc w/ PVC/PAC.   Retinal detachment, left 03/04/2020   Retinal tear 2020   Stroke (HCC) 10/29/2016   mild left side weakness   Stroke (HCC) 08/01/2019   Tobacco abuse    Past Surgical History:  Procedure Laterality Date   ABDOMINAL HYSTERECTOMY     BREAST SURGERY     CATARACT EXTRACTION W/PHACO Right 03/01/2017   Procedure: CATARACT EXTRACTION PHACO AND INTRAOCULAR LENS PLACEMENT (IOC)  RIGHT DIABETIC;  Surgeon: Mittie Gaskin, MD;  Location: [facility];  Service: Ophthalmology;  Laterality: Right;   CATARACT EXTRACTION W/PHACO Left 03/22/2017   Procedure: CATARACT EXTRACTION PHACO AND INTRAOCULAR LENS PLACEMENT (IOC) LEFT DIABETIC;  Surgeon: Mittie Gaskin, MD;  Location: [facility];  Service: Ophthalmology;  Laterality: Left;  Diabetic - insulin  and oral meds   COLONOSCOPY WITH PROPOFOL  N/A 09/23/2014   Procedure: COLONOSCOPY WITH PROPOFOL ;  Surgeon: Gladis RAYMOND Mariner, MD;  Location: [facility] ENDOSCOPY;  Service: Endoscopy;  Laterality: N/A;   COLONOSCOPY WITH PROPOFOL  N/A 01/11/2018   Procedure: COLONOSCOPY WITH PROPOFOL ;  Surgeon: Mariner Gladis RAYMOND, MD;  Location: [facility] ENDOSCOPY;  Service: Endoscopy;  Laterality: N/A;   COLONOSCOPY WITH PROPOFOL  N/A 04/24/2018   Procedure: COLONOSCOPY WITH PROPOFOL ;  Surgeon: Mariner Gladis RAYMOND, MD;  Location: [facility] ENDOSCOPY;  Service: Endoscopy;  Laterality: N/A;   COLONOSCOPY WITH PROPOFOL  N/A 11/19/2019   Procedure: COLONOSCOPY WITH PROPOFOL ;  Surgeon: Jinny Carmine, MD;  Location: [facility] ENDOSCOPY;  Service: Endoscopy;  Laterality: N/A;   CORONARY ANGIOPLASTY WITH STENT PLACEMENT  2008   CORONARY STENT INTERVENTION N/A 03/24/2020   Procedure: CORONARY STENT INTERVENTION;  Surgeon: Mady Bruckner, MD;  Location: [facility] INVASIVE CV LAB;  Service: Cardiovascular;  Laterality: N/A;   ESOPHAGOGASTRODUODENOSCOPY (EGD) WITH PROPOFOL  N/A 12/30/2014   Procedure: ESOPHAGOGASTRODUODENOSCOPY (EGD) WITH PROPOFOL ;  Surgeon: Gladis  RAYMOND Mariner, MD;  Location: [facility] ENDOSCOPY;  Service: Endoscopy;  Laterality: N/A;   ESOPHAGOGASTRODUODENOSCOPY (EGD) WITH PROPOFOL  N/A 07/19/2016   Procedure: ESOPHAGOGASTRODUODENOSCOPY (EGD) WITH PROPOFOL ;  Surgeon: Mariner Gladis RAYMOND, MD;  Location: [facility] ENDOSCOPY;  Service: Endoscopy;  Laterality: N/A;   ESOPHAGOGASTRODUODENOSCOPY (EGD) WITH PROPOFOL  N/A 04/24/2018   Procedure:  ESOPHAGOGASTRODUODENOSCOPY (EGD) WITH PROPOFOL ;  Surgeon: Mariner Gladis RAYMOND, MD;  Location: [facility] ENDOSCOPY;  Service: Endoscopy;  Laterality: N/A;   EYE SURGERY  cataracts, detached retina   hysterectomy (other)     LEFT HEART CATH AND CORONARY ANGIOGRAPHY N/A 03/24/2020   Procedure: LEFT HEART CATH AND CORONARY ANGIOGRAPHY;  Surgeon: Mady Bruckner, MD;  Location: [facility] INVASIVE CV LAB;  Service: Cardiovascular;  Laterality: N/A;   Patient Active Problem List   Diagnosis Date Noted   Stroke (HCC) 08/05/2023   Depression with anxiety 08/05/2023   Overweight (BMI 25.0-29.9) 08/05/2023   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) 03/17/2023   Dizziness 02/23/2023   Restrictive airway disease 07/21/2020   Partial thickness rotator cuff tear 03/20/2020   Shoulder pain 03/05/2020   Ataxia 02/28/2020   Difficulty walking 02/28/2020   Physical deconditioning 02/28/2020   Chronic diastolic congestive heart failure (HCC) 11/20/2019   Polyp of ascending colon    Hypokalemia 08/08/2019   TIA (transient ischemic attack) 08/01/2019   Benign hypertensive kidney disease with chronic kidney disease 04/25/2019   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/24/2018   Chronic, continuous use of opioids 03/26/2018   History of adenomatous polyp of colon 01/22/2018   Diverticulosis of colon 01/22/2018   History of CVA (cerebrovascular accident) 11/08/2016   Weakness of left upper extremity 10/30/2016   AAA (abdominal aortic aneurysm) without rupture 08/12/2016   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   Stage 3b chronic kidney disease (HCC) 07/27/2015   Type 2 diabetes mellitus with diabetic chronic kidney disease (HCC) 10/28/2014   Solitary pulmonary nodule on lung CT 08/20/2014   Allergic rhinitis 07/28/2014   Acid reflux 07/28/2014   Chronic obstructive pulmonary disease (HCC) 07/28/2014   Granuloma annulare    Back pain 11/12/2013   Lumbar scoliosis 10/30/2013   Lumbar  radiculopathy 10/30/2013   Lumbar canal stenosis 10/30/2013   Proteinuria 10/22/2013   Smokes with greater than 30 pack year history 03/21/2011   Edema 08/18/2010   Hyperlipidemia 03/25/2009   Coronary artery disease 03/25/2009   Primary hypertension 03/25/2009   ONSET DATE: 08/05/2023  REFERRING DIAG: M70.101 (ICD-10-CM) - Weakness of left upper extremity   THERAPY DIAG:  Muscle weakness (generalized)  Other lack of coordination  Rationale for Evaluation and Treatment: Rehabilitation  SUBJECTIVE:  SUBJECTIVE STATEMENT: Pt agreeable to shortened tx session today d/t arriving late d/t car trouble. Pt accompanied by: family member: daughter, Harrie  PERTINENT HISTORY: Daughter present and provided hx.  Daughter reports pt has had 4 strokes, beginning in 2018, 2019, January of 2025 and the most recent in June of 2025.  Pt had been participating in [facility] since the most recent CVA, and is now eager to transition to outpatient services where PT/OT/SLP have been ordered. Per MEDICAL RECORD NUMBERPMH: of CVA, COPD, Dysarthria, Ataxia, TIA, T2DM, Back pain, scoliosis   PRECAUTIONS: Fall, Loop recorder for Afib  WEIGHT BEARING RESTRICTIONS: No  PAIN:  01/01/24: 3/10 low back 12/21/2023: 4 out of 10 low back pain 12/11/23: chronic back pain with prolonged standing 3/10 11/21/23: 3/10 chronic back pain 11/08/23: Same as below 11/02/23: same as eval below Are you having pain? Yes: NPRS  scale: 3/10, can get up to 7-8/10 Pain location: low back and L hip Pain description: achy, sharp, dull Aggravating factors: prolonged standing  Relieving factors: sitting, pain meds, heat/ice   FALLS: Has patient fallen in last 6 months? Yes. Number of falls 1, tripping over cords  LIVING ENVIRONMENT: Lives with: lives with their spouse Lives in: 1 level home Stairs: Yes: External: 1 steps; none Has following equipment at home: walk in shower, comfort height toilet   PLOF: Prior to Jan of this year, pt was  managing all home management tasks, was able to drive, and was caring for spouse   PATIENT GOALS: Strengthening the left arm   OBJECTIVE:  Note: Objective measures were completed at Evaluation unless otherwise noted.  HAND DOMINANCE: Right  ADLs: Overall ADLs: Daughter is pt's primary caregiver and lives near by Transfers/ambulation related to ADLs: occasional help with using walker at home, depending on balance on a given day Eating: indep/able to cut food  Grooming: bimanual grooming without difficulty UB Dressing: extra time for clothing fasteners, but able to gather clothing with RW LB Dressing: extra time with clothing fasteners Toileting: occasional help to walk to the bathroom, but modified indep within the bathroom for toileting tasks Bathing: direct supv for bathing using walkin in shower, shower chair, and grab bar; gets hair washed weekly at salon Tub Shower transfers: supv for walk in shower Equipment: RW, SBQC, shower chair, grab bars  IADLs: Shopping: daughter currently managing Light housekeeping: daughter currently managing; can start laundry on a good day.  Meal Prep: starting to participate in light meal prep, including making eggs on stove top Community mobility: daughter assists with all transportation; pt using transport chair to reach therapy gym today Medication management: daughter sets up pills 2 weeks at a time  Financial management: pt is participating in bill paying with daughter's assistance; most are online Handwriting: 100% legible  POSTURE COMMENTS:  rounded shoulders, forward head, increased thoracic kyphosis  ACTIVITY TOLERANCE: Activity tolerance: to be further assessed within functional contexts  FUNCTIONAL OUTCOME MEASURES: TBD  UPPER EXTREMITY ROM:  BUEs WFL for daily tasks  UPPER EXTREMITY MMT:  Hx of R rotator cuff tear 4-5 years ago with residual weakness, non-surgical;    MMT Right eval Left eval  Shoulder flexion 3+ 4   Shoulder abduction 3+ 4  Shoulder adduction    Shoulder extension    Shoulder internal rotation 3+ 4-  Shoulder external rotation 3+ 3+  Elbow flexion 5 5  Elbow extension 5 5  Wrist flexion 4+ 5  Wrist extension 4+ 5  Wrist ulnar deviation    Wrist radial deviation    Wrist pronation    Wrist supination    (Blank rows = not tested)   *Most recent CVA affecting L non-dominant side, with LUE presenting with increased strength as compared to dominant/unaffected arm.  Assume RUE weaker from hx of rotator cuff tear, or possibly previous CVAs.  Pt does present with mild LUE ataxia, and bilat hand weakness.  HAND FUNCTION: Grip strength: Right: 32 lbs; Left: 30 lbs, Lateral pinch: Right: 8 lbs, Left: 6 lbs, and 3 point pinch: Right: 7 lbs, Left: 6 lbs  COORDINATION: Finger Nose Finger test: LUE mild ataxia  9 Hole Peg test: Right: 29 sec; Left: 47 sec  SENSATION: Pt reports intermittent numbness in the R thumb, IF, and LF  EDEMA: No visible edema  MUSCLE TONE: RUE: Within functional limits and LUE: Within functional limits  COGNITION: Overall cognitive status:  Impaired; see SLP eval for details  VISION: Pt reports L eye feels more blurry since first stroke in 2018; pt has reading glasses but doesn't usually wear them   VISION ASSESSMENT: To be further assessed in functional context Tracking/Visual pursuits: Able to track stimulus in all quads without difficulty Saccades: WFL Visual Fields: no apparent deficits Clock drawing completed: all numbers spaced evenly, but pt left out the 1 and verbalized not knowing how to draw ten minutes to eleven when this direction was given.  Patient has difficulty with following activities due to following visual impairments: To be further assessed  PERCEPTION: To be further assessed  PRAXIS: Impaired: Motor planning  OBSERVATIONS: Pt pleasant, cooperative.  Daughter present and very supportive in pt's care.                                                                                                                        TREATMENT DATE:01/01/24 Therapeutic Activity: -Facilitated L hand FMC/dexterity skills working on storage and translatory movements to move up to 2 alphabet dice 1 by 1 from palm to fingertips, and rotating dice between fingertips to place selected letters upright on table top; intermittent min vc to avoid assist from the R hand.  Self Care: -Condition management: Reinforced importance of consistent meals, hydration (OT provided water cup for pt to drink during session), notifying MD with any acute changes (daughter reports pt's speech has worsened), reinforced fall prevention strategies d/t fall reported last week (avoid extended reaching to floor level, especially when dizzy)  PATIENT EDUCATION: Education details: Condition management Person educated: Patient and Child(ren) Education method: Explanation and Verbal cues Education comprehension: verbalized understanding  HOME EXERCISE PROGRAM: Yellow theraputty  GOALS: Goals reviewed with patient? Yes  SHORT TERM GOALS: Target date: 12/11/23  Pt will perform HEP with min vc or less for improving LUE coordination and bilat hand strength. Baseline: Eval: HEP not yet initiated Goal status: INITIAL  2.  Pt will be indep to verbalize and implement 2-3 fall prevention measures to reduce fall risk with ADLs.  Baseline: Eval: Educ not yet initiated  Goal status: INITIAL  LONG TERM GOALS: Target date: 01/22/24  Pt will perform shower transfers with modified indep Baseline: Eval: Direct supv Goal status: INITIAL  2.  Pt will improve LUE GMC to enable reaching with good accuracy into overhead kitchen cabinets. Baseline: Eval: Mild LUE ataxia  Goal status: INITIAL  3.  Pt will improve L hand [facility] skills as demonstrated by 10 sec or more improvement in 9 hole peg test for improved efficiency when manipulating  clothing fasteners. Baseline: Eval: L 9 hole  peg test: 47 sec, R 29 sec Goal status: INITIAL  4.  Pt will manage light loads of laundry with modified indep. Baseline: Eval: Daughter mostly manages, but pt can start laundry on a good day Goal status: INITIAL  ASSESSMENT:  CLINICAL IMPRESSION: Pt returns today for first visit this month d/t cancelled visits over the last 10 days related  to illness.  Daughter questions possibility of pt having had another CVA d/t speech more impaired.  Pt also reports increased weakness and a recent fall last week when pt was dizzy.  Through discussion with pt and daughter, pt very sedentary at home, is not hydrating or eating well, and is engaging very little into conversation at home d/t daughter now back to work and spouse sleeping most of the day.  OT reinforced importance of notifying MD with any acute changes (changes in speech), importance of regular conversation, good hydration and nutrition, and increasing activity level in the home.  OT offered idea of transitioning back to [facility] to improve mobility and ADLs within the home, but daughter feels outpatient is good for pt to get out of the house.  OT inquired whether pt and spouse had ever considered ALF placement; pt stated that they had, but further discussion was limited d/t PT visit starting.  Planning 10th visit progress update next session, but attendance has been inconsistent thus far d/t a variety of barriers.  Will discuss progress towards goals and any changes to tx plan next session.      PERFORMANCE DEFICITS: in functional skills including ADLs, IADLs, coordination, dexterity, sensation, ROM, strength, pain, Fine motor control, Gross motor control, mobility, balance, body mechanics, endurance, decreased knowledge of precautions, decreased knowledge of use of DME, vision, and UE functional use, cognitive skills including emotional, perception, problem solving, safety awareness, temperament/personality, and understand, and psychosocial skills including  coping strategies, environmental adaptation, and routines and behaviors.   IMPAIRMENTS: are limiting patient from ADLs, IADLs, leisure, and social participation.   CO-MORBIDITIES: has co-morbidities such as afib, AAA, hx of multiple CVAs that affects occupational performance. Patient will benefit from skilled OT to address above impairments and improve overall function.  MODIFICATION OR ASSISTANCE TO COMPLETE EVALUATION: Min-Moderate modification of tasks or assist with assess necessary to complete an evaluation.  OT OCCUPATIONAL PROFILE AND HISTORY: Detailed assessment: Review of records and additional review of physical, cognitive, psychosocial history related to current functional performance.  CLINICAL DECISION MAKING: Moderate - several treatment options, min-mod task modification necessary  REHAB POTENTIAL: Good  EVALUATION COMPLEXITY: Moderate    PLAN:  OT FREQUENCY: 2x/week  OT DURATION: 12 weeks  PLANNED INTERVENTIONS: 97168 OT Re-evaluation, 97535 self care/ADL training, 02889 therapeutic exercise, 97530 therapeutic activity, 97112 neuromuscular re-education, 97140 manual therapy, 97116 gait training, 02989 moist heat, 97010 cryotherapy, 97129 Cognitive training (first 15 min), 02249 Physical Performance Testing, balance training, functional mobility training, visual/perceptual remediation/compensation, psychosocial skills training, energy conservation, coping strategies training, patient/family education, and DME and/or AE instructions  RECOMMENDED OTHER SERVICES: none at this time (OT/PT/SLP services have all been ordered)  CONSULTED AND AGREED WITH PLAN OF CARE: Patient and family member/caregiver  PLAN FOR NEXT SESSION: see above  Inocente Blazing, MS, OTR/L   01/02/2024, 7:37 PM

## 2024-01-02 NOTE — Progress Notes (Signed)
 " Established patient visit  Patient: Hannah Kirby   DOB: 12/08/1945   78 y.o. Female  MRN: [id number] Visit Date: 01/02/2024  Today's healthcare provider: Jolynn Spencer, PA-C   Chief Complaint  Patient presents with   Hypertension    Blood pressure has been up and down.  Daughter reports of her being more fatigue, speech is off, balance unstable. Went to therapy yesterday BP sitting 140/80-90, standing 112-113. BP medication Felodipine  takes in the evening.    Subjective      Discussed the use of AI scribe software for clinical note transcription with the patient, who gave verbal consent to proceed.  History of Present Illness Hannah Kirby is a 78 year old female with hypertension and COPD who presents with concerns about blood pressure fluctuations and dizziness.  Blood pressure is elevated at 147/89 today. She does not regularly measure blood pressure at home. Current medications include metoprolol  and lisinopril , both taken once daily. During a recent physical therapy session, blood pressure dropped to 112/113, which is unusual compared to previous readings.  Dizziness and weakness led to a visit to an ENT specialist. No current dizziness, but she feels slightly more off balance recently.  She has COPD and smokes. She uses a Trelegy inhaler at night but does not use her daytime inhaler regularly. Shortness of breath is present but considered normal for her. No leg swelling or urinary issues.       10/18/2023    3:05 PM 08/18/2023    9:51 AM 07/24/2023    1:53 PM  Depression screen PHQ 2/9  Decreased Interest 2 3 2   Down, Depressed, Hopeless 2 3 3   PHQ - 2 Score 4 6 5   Altered sleeping 2 3 3   Tired, decreased energy 2 3 3   Change in appetite 2 3 2   Feeling bad or failure about yourself  2 3 2   Trouble concentrating 2 3 2   Moving slowly or fidgety/restless 2 3 2   Suicidal thoughts 2 3 2   PHQ-9 Score 18  27  21    Difficult doing work/chores Very difficult Very  difficult Very difficult     Data saved with a previous flowsheet row definition      10/18/2023    3:05 PM 08/18/2023    9:51 AM 07/24/2023    1:53 PM 03/13/2023   10:37 AM  GAD 7 : Generalized Anxiety Score  Nervous, Anxious, on Edge 2 3 1 1   Control/stop worrying 2 3 1 1   Worry too much - different things 2 3 1 1   Trouble relaxing 2 3 2 1   Restless 2 2 1 1   Easily annoyed or irritable 2 3 2 1   Afraid - awful might happen 2 2 1 1   Total GAD 7 Score 14 19 9 7   Anxiety Difficulty Very difficult Very difficult Very difficult Somewhat difficult    Medications: Outpatient Medications Prior to Visit  Medication Sig   acetaminophen  (TYLENOL ) 500 MG tablet Take 500 mg by mouth every 6 (six) hours as needed.   albuterol  (VENTOLIN  HFA) 108 (90 Base) MCG/ACT inhaler Inhale 2 puffs into the lungs every 4 (four) hours as needed for wheezing or shortness of breath.   aspirin  (ASPIR-81) 81 MG EC tablet Take 81 mg by mouth daily.   calcium  carbonate (TUMS - DOSED IN MG ELEMENTAL CALCIUM ) 500 MG chewable tablet Chew 1 tablet by mouth daily as needed.   clopidogrel  (PLAVIX ) 75 MG tablet TAKE ONE TABLET BY MOUTH EVERY DAY  cyanocobalamin  1000 MCG tablet Take 1,000 mcg by mouth daily.   dapagliflozin  propanediol (FARXIGA ) 5 MG TABS tablet Take 1 tablet (5 mg total) by mouth daily before breakfast. (Patient taking differently: Take 2.5 mg by mouth every morning.)   Docusate Sodium  (DSS) 100 MG CAPS Take 1 capsule by mouth daily.   ezetimibe  (ZETIA ) 10 MG tablet TAKE ONE TABLET BY MOUTH EVERY DAY   felodipine  (PLENDIL ) 5 MG 24 hr tablet Take 1 tablet (5 mg total) by mouth daily.   Finerenone  (KERENDIA ) 10 MG TABS Take 1 tablet (10 mg total) by mouth daily.   Fluticasone -Umeclidin-Vilant (TRELEGY ELLIPTA ) 100-62.5-25 MCG/ACT AEPB INHALE 1 PUFF BY MOUTH EVERY DAY - DISCARD DEVICE 6 WEEKS AFTER IT IS REMOVED FROM THE FOIL TRAY OR WHEN THE DOSE COUNTER READS &quot;0&quot; (WHICHEVER COMES FIRST)    HYDROcodone -acetaminophen  (NORCO) 7.5-325 MG tablet Take 1 tablet by mouth every 6 (six) hours as needed for moderate pain (pain score 4-6).   insulin  glargine (LANTUS ) 100 UNIT/ML injection Inject 22 Units into the skin daily.   ipratropium (ATROVENT ) 0.06 % nasal spray Place 2 sprays into both nostrils 4 (four) times daily.   lisinopril  (ZESTRIL ) 20 MG tablet Take 1 tablet (20 mg total) by mouth daily.   meclizine  (ANTIVERT ) 25 MG tablet Take 25 mg by mouth 3 (three) times daily as needed.   metoprolol  tartrate (LOPRESSOR ) 100 MG tablet Take 1 tablet (100 mg total) by mouth 2 (two) times daily.   Multiple Vitamins-Minerals (DAILY MULTI) TABS Take 1 tablet by mouth daily.   nitroGLYCERIN  (NITROSTAT ) 0.4 MG SL tablet Place 1 tablet (0.4 mg total) under the tongue every 5 (five) minutes as needed.   Omega-3 Fatty Acids (FISH OIL ) 1000 MG CAPS Take 2 capsules by mouth in the morning and at bedtime.    ondansetron  (ZOFRAN  ODT) 4 MG disintegrating tablet Take 1 tablet (4 mg total) by mouth every 8 (eight) hours as needed for nausea or vomiting.   promethazine -dextromethorphan  (PROMETHAZINE -DM) 6.25-15 MG/5ML syrup Take 2.5 mLs by mouth at bedtime as needed.   RABEprazole  (ACIPHEX ) 20 MG tablet Take 1 tablet (20 mg total) by mouth daily. 15 Mins. before evening meal   rosuvastatin  (CRESTOR ) 40 MG tablet Take 1 tablet (40 mg total) by mouth daily.   Semaglutide, 1 MG/DOSE, (OZEMPIC, 1 MG/DOSE,) 4 MG/3ML SOPN Inject 1 mg into the skin once a week.   sertraline  (ZOLOFT ) 25 MG tablet Take 25 mg by mouth daily.   sucralfate  (CARAFATE ) 1 g tablet Take 1 tablet (1 g total) by mouth 2 (two) times daily. 15 Minutes before evening meal and at bed time   No facility-administered medications prior to visit.    Review of Systems All negative Except see HPI       Objective    BP 106/80   Pulse 82   Ht 5' 5 (1.651 m)   Wt 159 lb 11.2 oz (72.4 kg)   SpO2 96%   BMI 26.58 kg/m     Physical  Exam Vitals reviewed.  Constitutional:      General: She is not in acute distress.    Appearance: Normal appearance. She is well-developed. She is not diaphoretic.  HENT:     Head: Normocephalic and atraumatic.  Eyes:     General: No scleral icterus.    Conjunctiva/sclera: Conjunctivae normal.  Neck:     Thyroid : No thyromegaly.  Cardiovascular:     Rate and Rhythm: Normal rate and regular rhythm.  Pulses: Normal pulses.     Heart sounds: Normal heart sounds. No murmur heard. Pulmonary:     Effort: Pulmonary effort is normal. No respiratory distress.     Breath sounds: Normal breath sounds. No wheezing, rhonchi or rales.  Musculoskeletal:     Cervical back: Neck supple.     Right lower leg: No edema.     Left lower leg: No edema.  Lymphadenopathy:     Cervical: No cervical adenopathy.  Skin:    General: Skin is warm and dry.     Findings: No rash.  Neurological:     Mental Status: She is alert and oriented to person, place, and time. Mental status is at baseline.  Psychiatric:        Mood and Affect: Mood normal.        Behavior: Behavior normal.      No results found for any visits on 01/02/24.      Assessment & Plan Essential hypertension Chronic and unstable Blood pressure elevated at 147/89 mmHg-first reading, 1016/80 - second reading. On metoprolol  100 mg and lisinopril  20 mg. Concerns about orthostatic hypotension due to low readings during physical therapy. Confirm ortho stasis during her visit, see ext vitals - Measure blood pressure at home twice daily for two weeks. - Reassess blood pressure management in two weeks. Contact Cardiology for close follow-up appointment!  Dizziness and suspected orthostatic hypotension Reports dizziness and imbalance. Blood pressure drops to 112-113 mmHg upon standing, suggesting orthostatic hypotension. No leg swelling or current dizziness. Concerns about potential stroke, but normal neuro exam - Ordered EKG and blood work  to assess cardiovascular status. EKG showed normal sinus rhythm Advised BP monitoring for side effects such as supine hypertension and electrolyte disturbances.  Advised the following non pharmacological measures:   Liberalize salt and fluid intake (up to 2.5 L/day).  Use physical countermaneuvers like leg crossing and standing up slowly.  Wear compression stockings or an abdominal binder.  Elevate the head of the bed by 30 to 45.  Avoid large carbohydrate meals and excessive alcohol  Chronic obstructive pulmonary disease (COPD) COPD with smoking history. Reports normal shortness of breath. Using Trelegy  - Educated on importance of inhaler adherence. Advised smoking cessation Will follow-up  Primary hypertension (Primary)  - CBC with Differential/Platelet - Comprehensive metabolic panel with GFR - TSH - Vitamin B12  Type 2 diabetes mellitus with chronic kidney disease, without long-term current use of insulin , unspecified CKD stage (HCC) Chronic Last A1c was 6.7 Unclear if she continues her DM regimen, there are none on dispense history Continue lifestyle modifications Managed by endocrinology. Last was seen on 12/05/23  Other fatigue Chronic and stable Multifactorial Workup ordered - CBC with Differential/Platelet - Comprehensive metabolic panel with GFR - TSH - Vitamin B12 - EKG 12-Lead Will reassess after  receiving lab results   Dizziness - EKG 12-Lead  Orders Placed This Encounter  Procedures   CBC with Differential/Platelet   Comprehensive metabolic panel with GFR   TSH   Vitamin B12   EKG 12-Lead    Return in about 2 weeks (around 01/16/2024), or fu with primary.   The patient was advised to call back or seek an in-person evaluation if the symptoms worsen or if the condition fails to improve as anticipated.  I discussed the assessment and treatment plan with the patient. The patient was provided an opportunity to ask questions and all were answered.  The patient agreed with the plan and demonstrated an understanding of  the instructions.  I, Caitlynne Harbeck, PA-C have reviewed all documentation for this visit. The documentation on 01/02/2024  for the exam, diagnosis, procedures, and orders are all accurate and complete.  Jolynn Spencer, [facility], MMS [facility] [phone] (phone) [phone] (fax)  [facility] "

## 2024-01-03 ENCOUNTER — Ambulatory Visit

## 2024-01-03 DIAGNOSIS — M6281 Muscle weakness (generalized): Secondary | ICD-10-CM | POA: Diagnosis not present

## 2024-01-03 DIAGNOSIS — R41841 Cognitive communication deficit: Secondary | ICD-10-CM

## 2024-01-03 DIAGNOSIS — R482 Apraxia: Secondary | ICD-10-CM

## 2024-01-03 DIAGNOSIS — R4701 Aphasia: Secondary | ICD-10-CM | POA: Diagnosis not present

## 2024-01-03 DIAGNOSIS — R2681 Unsteadiness on feet: Secondary | ICD-10-CM | POA: Diagnosis not present

## 2024-01-03 DIAGNOSIS — R278 Other lack of coordination: Secondary | ICD-10-CM

## 2024-01-03 NOTE — Therapy (Signed)
 OUTPATIENT OCCUPATIONAL THERAPY NEURO PROGRESS AND TREATMENT NOTE Reporting period beginning 10/30/23-01/03/24  Patient Name: Hannah Kirby MRN: [id number] DOB:May 16, 1945, 78 y.o., female Today's Date: 01/03/2024  PCP: Dr. [name] Perry REFERRING PROVIDER: Dr. [name] Perry  END OF SESSION:  OT End of Session - 01/03/24 1019     Visit Number 10    Number of Visits 24    Date for Recertification  01/22/24    OT Start Time 1015    OT Stop Time 1100    OT Time Calculation (min) 45 min    Equipment Utilized During Treatment transport chair    Activity Tolerance Patient tolerated treatment well    Behavior During Therapy WFL for tasks assessed/performed         Past Medical History:  Diagnosis Date   Acute hemorrhagic colitis 08/08/2019   Anemia    C. difficile colitis 08/09/2019   CAD (coronary artery disease)    a. 02/2006 PCI: BMS x 2 to RCA, cath o/w without significant coronary disease; b. nuclear stress test 07/2014: No ischemia/infarct; c. 11/2017 MV: no isch/infarct, EF 55-65%; d. 03/2020 NSTEMI/PCI: LM nl, LAD min irregs, RI 25, small, LCX nl, RCA 30p/m ISR, 99d (3.0x15 Resolute Onyx DES).   Cataract 02/04/2018   Cerebrovascular accident (CVA) (HCC) 03/18/2023   dysarthria   Chronic bronchitis (HCC)    secondary to cigarette smoking   FHx: allergies    Goiter    Granulomatous disease    Hernia    Kidney stone on left side 2013   NSTEMI (non-ST elevated myocardial infarction) (HCC) 03/23/2020   Oxygen  deficiency    Panic attacks    PVC's (premature ventricular contractions)    a. 03/2018 Zio: Occas PVCs (2.5%). Triggered events assoc w/ PVC/PAC.   Retinal detachment, left 03/04/2020   Retinal tear 2020   Stroke (HCC) 10/29/2016   mild left side weakness   Stroke (HCC) 08/01/2019   Tobacco abuse    Past Surgical History:  Procedure Laterality Date   ABDOMINAL HYSTERECTOMY     BREAST SURGERY     CATARACT EXTRACTION W/PHACO Right 03/01/2017   Procedure:  CATARACT EXTRACTION PHACO AND INTRAOCULAR LENS PLACEMENT (IOC) RIGHT DIABETIC;  Surgeon: Mittie Gaskin, MD;  Location: [facility];  Service: Ophthalmology;  Laterality: Right;   CATARACT EXTRACTION W/PHACO Left 03/22/2017   Procedure: CATARACT EXTRACTION PHACO AND INTRAOCULAR LENS PLACEMENT (IOC) LEFT DIABETIC;  Surgeon: Mittie Gaskin, MD;  Location: [facility];  Service: Ophthalmology;  Laterality: Left;  Diabetic - insulin  and oral meds   COLONOSCOPY WITH PROPOFOL  N/A 09/23/2014   Procedure: COLONOSCOPY WITH PROPOFOL ;  Surgeon: Gladis RAYMOND Mariner, MD;  Location: [facility] ENDOSCOPY;  Service: Endoscopy;  Laterality: N/A;   COLONOSCOPY WITH PROPOFOL  N/A 01/11/2018   Procedure: COLONOSCOPY WITH PROPOFOL ;  Surgeon: Mariner Gladis RAYMOND, MD;  Location: [facility] ENDOSCOPY;  Service: Endoscopy;  Laterality: N/A;   COLONOSCOPY WITH PROPOFOL  N/A 04/24/2018   Procedure: COLONOSCOPY WITH PROPOFOL ;  Surgeon: Mariner Gladis RAYMOND, MD;  Location: [facility] ENDOSCOPY;  Service: Endoscopy;  Laterality: N/A;   COLONOSCOPY WITH PROPOFOL  N/A 11/19/2019   Procedure: COLONOSCOPY WITH PROPOFOL ;  Surgeon: Jinny Carmine, MD;  Location: [facility] ENDOSCOPY;  Service: Endoscopy;  Laterality: N/A;   CORONARY ANGIOPLASTY WITH STENT PLACEMENT  2008   CORONARY STENT INTERVENTION N/A 03/24/2020   Procedure: CORONARY STENT INTERVENTION;  Surgeon: Mady Bruckner, MD;  Location: [facility] INVASIVE CV LAB;  Service: Cardiovascular;  Laterality: N/A;   ESOPHAGOGASTRODUODENOSCOPY (EGD) WITH PROPOFOL  N/A 12/30/2014   Procedure: ESOPHAGOGASTRODUODENOSCOPY (  EGD) WITH PROPOFOL ;  Surgeon: Gladis RAYMOND Mariner, MD;  Location: [facility] ENDOSCOPY;  Service: Endoscopy;  Laterality: N/A;   ESOPHAGOGASTRODUODENOSCOPY (EGD) WITH PROPOFOL  N/A 07/19/2016   Procedure: ESOPHAGOGASTRODUODENOSCOPY (EGD) WITH PROPOFOL ;  Surgeon: Mariner Gladis RAYMOND, MD;  Location: [facility] ENDOSCOPY;  Service: Endoscopy;  Laterality: N/A;   ESOPHAGOGASTRODUODENOSCOPY (EGD)  WITH PROPOFOL  N/A 04/24/2018   Procedure: ESOPHAGOGASTRODUODENOSCOPY (EGD) WITH PROPOFOL ;  Surgeon: Mariner Gladis RAYMOND, MD;  Location: [facility] ENDOSCOPY;  Service: Endoscopy;  Laterality: N/A;   EYE SURGERY  cataracts, detached retina   hysterectomy (other)     LEFT HEART CATH AND CORONARY ANGIOGRAPHY N/A 03/24/2020   Procedure: LEFT HEART CATH AND CORONARY ANGIOGRAPHY;  Surgeon: Mady Bruckner, MD;  Location: [facility] INVASIVE CV LAB;  Service: Cardiovascular;  Laterality: N/A;   Patient Active Problem List   Diagnosis Date Noted   Stroke (HCC) 08/05/2023   Depression with anxiety 08/05/2023   Overweight (BMI 25.0-29.9) 08/05/2023   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) 03/17/2023   Dizziness 02/23/2023   Restrictive airway disease 07/21/2020   Partial thickness rotator cuff tear 03/20/2020   Shoulder pain 03/05/2020   Ataxia 02/28/2020   Difficulty walking 02/28/2020   Physical deconditioning 02/28/2020   Chronic diastolic congestive heart failure (HCC) 11/20/2019   Polyp of ascending colon    Hypokalemia 08/08/2019   TIA (transient ischemic attack) 08/01/2019   Benign hypertensive kidney disease with chronic kidney disease 04/25/2019   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/24/2018   Chronic, continuous use of opioids 03/26/2018   History of adenomatous polyp of colon 01/22/2018   Diverticulosis of colon 01/22/2018   History of CVA (cerebrovascular accident) 11/08/2016   Weakness of left upper extremity 10/30/2016   AAA (abdominal aortic aneurysm) without rupture 08/12/2016   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   Stage 3b chronic kidney disease (HCC) 07/27/2015   Type 2 diabetes mellitus with diabetic chronic kidney disease (HCC) 10/28/2014   Solitary pulmonary nodule on lung CT 08/20/2014   Allergic rhinitis 07/28/2014   Acid reflux 07/28/2014   Chronic obstructive pulmonary disease (HCC) 07/28/2014   Granuloma annulare    Back pain  11/12/2013   Lumbar scoliosis 10/30/2013   Lumbar radiculopathy 10/30/2013   Lumbar canal stenosis 10/30/2013   Proteinuria 10/22/2013   Smokes with greater than 30 pack year history 03/21/2011   Edema 08/18/2010   Hyperlipidemia 03/25/2009   Coronary artery disease 03/25/2009   Primary hypertension 03/25/2009   ONSET DATE: 08/05/2023  REFERRING DIAG: M70.101 (ICD-10-CM) - Weakness of left upper extremity   THERAPY DIAG:  No diagnosis found.  Rationale for Evaluation and Treatment: Rehabilitation  SUBJECTIVE:  SUBJECTIVE STATEMENT: Pt reports no PT today; still having BP issues.   Pt accompanied by: family member: daughter, Harrie  PERTINENT HISTORY: Daughter present and provided hx.  Daughter reports pt has had 4 strokes, beginning in 2018, 2019, January of 2025 and the most recent in June of 2025.  Pt had been participating in [facility] since the most recent CVA, and is now eager to transition to outpatient services where PT/OT/SLP have been ordered. Per MEDICAL RECORD NUMBERPMH: of CVA, COPD, Dysarthria, Ataxia, TIA, T2DM, Back pain, scoliosis   PRECAUTIONS: Fall, Loop recorder for Afib  WEIGHT BEARING RESTRICTIONS: No  PAIN:  01/03/24: 3/10 low back 01/01/24: 3/10 low back 12/21/2023: 4 out of 10 low back pain 12/11/23: chronic back pain with prolonged standing 3/10 11/21/23: 3/10 chronic back pain 11/08/23: Same as below 11/02/23: same as eval below Are you having  pain? Yes: NPRS scale: 3/10, can get up to 7-8/10 Pain location: low back and L hip Pain description: achy, sharp, dull Aggravating factors: prolonged standing  Relieving factors: sitting, pain meds, heat/ice   FALLS: Has patient fallen in last 6 months? Yes. Number of falls 1, tripping over cords  LIVING ENVIRONMENT: Lives with: lives with their spouse Lives in: 1 level home Stairs: Yes: External: 1 steps; none Has following equipment at home: walk in shower, comfort height toilet   PLOF: Prior to Jan of this year,  pt was managing all home management tasks, was able to drive, and was caring for spouse   PATIENT GOALS: Strengthening the left arm   OBJECTIVE:  Note: Objective measures were completed at Evaluation unless otherwise noted.  HAND DOMINANCE: Right  ADLs: Overall ADLs: Daughter is pt's primary caregiver and lives near by Transfers/ambulation related to ADLs: occasional help with using walker at home, depending on balance on a given day Eating: indep/able to cut food  Grooming: bimanual grooming without difficulty UB Dressing: extra time for clothing fasteners, but able to gather clothing with RW LB Dressing: extra time with clothing fasteners Toileting: occasional help to walk to the bathroom, but modified indep within the bathroom for toileting tasks Bathing: direct supv for bathing using walkin in shower, shower chair, and grab bar; gets hair washed weekly at salon Tub Shower transfers: supv for walk in shower Equipment: RW, SBQC, shower chair, grab bars  IADLs: Shopping: daughter currently managing Light housekeeping: daughter currently managing; can start laundry on a good day.  Meal Prep: starting to participate in light meal prep, including making eggs on stove top Community mobility: daughter assists with all transportation; pt using transport chair to reach therapy gym today Medication management: daughter sets up pills 2 weeks at a time  Financial management: pt is participating in bill paying with daughter's assistance; most are online Handwriting: 100% legible  POSTURE COMMENTS:  rounded shoulders, forward head, increased thoracic kyphosis  ACTIVITY TOLERANCE: Activity tolerance: to be further assessed within functional contexts  FUNCTIONAL OUTCOME MEASURES: TBD  UPPER EXTREMITY ROM:  BUEs WFL for daily tasks  UPPER EXTREMITY MMT:  Hx of R rotator cuff tear 4-5 years ago with residual weakness, non-surgical;    MMT Right eval Right 01/03/24 Left eval  Left 01/03/24  Shoulder flexion 3+ 3+ 4 4  Shoulder abduction 3+ 3+ 4 4  Shoulder adduction      Shoulder extension      Shoulder internal rotation 3+ 3+ 4- 4  Shoulder external rotation 3+ 3+ 3+ 4-  Elbow flexion 5 5 5 5   Elbow extension 5 5 5 5   Wrist flexion 4+ 5 5 5   Wrist extension 4+ 5 5 5   Wrist ulnar deviation      Wrist radial deviation      Wrist pronation      Wrist supination      (Blank rows = not tested)   *Most recent CVA affecting L non-dominant side, with LUE presenting with increased strength as compared to dominant/unaffected arm.  Assume RUE weaker from hx of rotator cuff tear, or possibly previous CVAs.  Pt does present with mild LUE ataxia, and bilat hand weakness.  HAND FUNCTION: Grip strength: Right: 32 lbs; Left: 30 lbs, Lateral pinch: Right: 8 lbs, Left: 6 lbs, and 3 point pinch: Right: 7 lbs, Left: 6 lbs Grip strength: Right: 40 lbs; Left: 30 lbs, Lateral pinch: Right: 16 lbs, Left: 12; 3 point pinch: Right: 18  lbs, Left: 13 lbs   COORDINATION: Finger Nose Finger test: LUE mild ataxia  9 Hole Peg test: Right: 29 sec; Left: 47 sec 01/03/24: Right: 27 sec, Left: 37 sec   SENSATION: Pt reports intermittent numbness in the R thumb, IF, and LF  EDEMA: No visible edema  MUSCLE TONE: RUE: Within functional limits and LUE: Within functional limits  COGNITION: Overall cognitive status: Impaired; see SLP eval for details  VISION: Pt reports L eye feels more blurry since first stroke in 2018; pt has reading glasses but doesn't usually wear them   VISION ASSESSMENT: To be further assessed in functional context Tracking/Visual pursuits: Able to track stimulus in all quads without difficulty Saccades: WFL Visual Fields: no apparent deficits Clock drawing completed: all numbers spaced evenly, but pt left out the 1 and verbalized not knowing how to draw ten minutes to eleven when this direction was given.  Patient has difficulty with following activities due  to following visual impairments: To be further assessed  PERCEPTION: To be further assessed  PRAXIS: Impaired: Motor planning  OBSERVATIONS: Pt pleasant, cooperative.  Daughter present and very supportive in pt's care.                                                                                                                       TREATMENT DATE:01/03/24 Therapeutic Activity: -Objective measures taken and goals updated for progress note. -VS monitoring: 140/82 L arm in sitting, 101/80 in standing; 02 satus 94-95% on room air in standing and during slow paced functional mobility ~50 ft, HR 69  Self Care: -Condition management: importance of consistent hydration, eating 3 meals a day, and increasing activity level in the home in order to maximize strength/activity tolerance/indep with daily tasks/achieving therapy goals   -Review of progress towards goals   PATIENT EDUCATION: Education details: Condition management Person educated: Patient and Child(ren) Education method: Explanation and Verbal cues Education comprehension: verbalized understanding  HOME EXERCISE PROGRAM: Yellow theraputty  GOALS: Goals reviewed with patient? Yes  SHORT TERM GOALS: Target date: 12/11/23  Pt will perform HEP with min vc or less for improving LUE coordination and bilat hand strength. Baseline: Eval: HEP not yet initiated; 01/03/24: Pt acknowledges minimal participation in HEP in the home, but does have HEP in place with putty and coordination training activities Goal status: in progress  2.  Pt will be indep to verbalize and implement 2-3 fall prevention measures to reduce fall risk with ADLs. Baseline: Eval: Educ not yet initiated; 01/03/24: 1 fall last week after reaching to floor level to pick up soiled dog pad while dizzy; pt able to verbalize 3 fall prevention strategies with min vc.   Goal status: in progress  LONG TERM GOALS: Target date: 01/22/24  Pt will perform shower transfers with  modified indep Baseline: Eval: Direct supv; 01/03/24: direct supv from spouse, which pt plans to continue Goal status: d/c  2.  Pt will improve LUE GMC to enable reaching with good accuracy into overhead  kitchen cabinets. Baseline: Eval: Mild LUE ataxia; 01/03/24: Residual mild LUE ataxia and pt denies attempts at reaching into kitchen cupboards d/t family managing meals Goal status: in progress  3.  Pt will improve L hand [facility] skills as demonstrated by 10 sec or more improvement in 9 hole peg test for improved efficiency when manipulating  clothing fasteners. Baseline: Eval: L 9 hole peg test: L 47 sec, R 29 sec; 01/03/24: L 37 sec, R 27 sec, indep with clothing fasteners Goal status: achieved  4.  Pt will manage light loads of laundry with modified indep. Baseline: Eval: Daughter mostly manages, but pt can start laundry on a good day; 01/03/24: Pt can start laundry, and has been given recommendations for safe reaching strategies and item transport for increasing indep with this task, but has not yet attempted these strategies at home.  Pt continues to rely on family to manage laundry tasks. Goal status: in progress  5. Pt will perform hot or cold light meal prep with distant supv and RW for at least 1 meal per day.  Baseline: 01/03/24: Family currently manages meal preparation  Goal status: New  ASSESSMENT: CLINICAL IMPRESSION: Pt seen for 10th visit progress update.  Pt does demonstrate improvements in L hand strength and FMC, allowing better engagement of the L hand with daily tasks.  Pt however, has experienced multiple episodes of illness since OT evaluation, resulting in several missed visits, and continues to have ongoing BP issues.  Daughter as returned to work, so is less available to pt for direct supv in the home, and pt and daughter both acknowledge that pt's spouse sleeps most hours of the day, which contributes to pt having decreased social engagement during the day and little  help with daily tasks.  Pt acknowledges being sedentary at home and daughter is concerned with pt poorly managing her own food and liquid intake throughout the day.  Pt denies feeling depressed, but daughter feels pt is lonely.  OT reinforced importance of condition management as stated above in treatment note in order to positively impact pt's progress towards goals and pt's overall ability to increase participation in ADL/IADL tasks.  Anticipate readiness to d/c end of cert if pt is unable to increase carry over with committing to eating 3 meals a day, increasing hydration, and increasing activity level and ADL participation in the home.    PERFORMANCE DEFICITS: in functional skills including ADLs, IADLs, coordination, dexterity, sensation, ROM, strength, pain, Fine motor control, Gross motor control, mobility, balance, body mechanics, endurance, decreased knowledge of precautions, decreased knowledge of use of DME, vision, and UE functional use, cognitive skills including emotional, perception, problem solving, safety awareness, temperament/personality, and understand, and psychosocial skills including coping strategies, environmental adaptation, and routines and behaviors.   IMPAIRMENTS: are limiting patient from ADLs, IADLs, leisure, and social participation.   CO-MORBIDITIES: has co-morbidities such as afib, AAA, hx of multiple CVAs that affects occupational performance. Patient will benefit from skilled OT to address above impairments and improve overall function.  MODIFICATION OR ASSISTANCE TO COMPLETE EVALUATION: Min-Moderate modification of tasks or assist with assess necessary to complete an evaluation.  OT OCCUPATIONAL PROFILE AND HISTORY: Detailed assessment: Review of records and additional review of physical, cognitive, psychosocial history related to current functional performance.  CLINICAL DECISION MAKING: Moderate - several treatment options, min-mod task modification  necessary  REHAB POTENTIAL: Good  EVALUATION COMPLEXITY: Moderate    PLAN:  OT FREQUENCY: 2x/week  OT DURATION: 12 weeks  PLANNED INTERVENTIONS: 02831  OT Re-evaluation, 97535 self care/ADL training, 02889 therapeutic exercise, 97530 therapeutic activity, 97112 neuromuscular re-education, 97140 manual therapy, 97116 gait training, 02989 moist heat, 97010 cryotherapy, 97129 Cognitive training (first 15 min), 02249 Physical Performance Testing, balance training, functional mobility training, visual/perceptual remediation/compensation, psychosocial skills training, energy conservation, coping strategies training, patient/family education, and DME and/or AE instructions  RECOMMENDED OTHER SERVICES: none at this time (OT/PT/SLP services have all been ordered)  CONSULTED AND AGREED WITH PLAN OF CARE: Patient and family member/caregiver  PLAN FOR NEXT SESSION: see above  Inocente Blazing, MS, OTR/L  01/03/2024, 10:19 AM

## 2024-01-03 NOTE — Therapy (Signed)
 OUTPATIENT SPEECH LANGUAGE PATHOLOGY APHASIA TREATMENT   Patient Name: Hannah Kirby MRN: [id number] DOB:03/12/1945, 78 y.o., female Today's Date: 01/03/2024  PCP: [name] Perry, MD REFERRING PROVIDER: [name] Perry, MD   End of Session - 01/03/24 1241     Visit Number 15    Number of Visits 23    Date for Recertification  01/09/24    Progress Note Due on Visit 20    SLP Start Time 1110    SLP Stop Time  1145    SLP Time Calculation (min) 35 min    Activity Tolerance --   pt reports feeling weak         Past Medical History:  Diagnosis Date   Acute hemorrhagic colitis 08/08/2019   Anemia    C. difficile colitis 08/09/2019   CAD (coronary artery disease)    a. 02/2006 PCI: BMS x 2 to RCA, cath o/w without significant coronary disease; b. nuclear stress test 07/2014: No ischemia/infarct; c. 11/2017 MV: no isch/infarct, EF 55-65%; d. 03/2020 NSTEMI/PCI: LM nl, LAD min irregs, RI 25, small, LCX nl, RCA 30p/m ISR, 99d (3.0x15 Resolute Onyx DES).   Cataract 02/04/2018   Cerebrovascular accident (CVA) (HCC) 03/18/2023   dysarthria   Chronic bronchitis (HCC)    secondary to cigarette smoking   FHx: allergies    Goiter    Granulomatous disease    Hernia    Kidney stone on left side 2013   NSTEMI (non-ST elevated myocardial infarction) (HCC) 03/23/2020   Oxygen  deficiency    Panic attacks    PVC's (premature ventricular contractions)    a. 03/2018 Zio: Occas PVCs (2.5%). Triggered events assoc w/ PVC/PAC.   Retinal detachment, left 03/04/2020   Retinal tear 2020   Stroke (HCC) 10/29/2016   mild left side weakness   Stroke (HCC) 08/01/2019   Tobacco abuse    Past Surgical History:  Procedure Laterality Date   ABDOMINAL HYSTERECTOMY     BREAST SURGERY     CATARACT EXTRACTION W/PHACO Right 03/01/2017   Procedure: CATARACT EXTRACTION PHACO AND INTRAOCULAR LENS PLACEMENT (IOC) RIGHT DIABETIC;  Surgeon: Mittie Gaskin, MD;  Location: [facility];  Service:  Ophthalmology;  Laterality: Right;   CATARACT EXTRACTION W/PHACO Left 03/22/2017   Procedure: CATARACT EXTRACTION PHACO AND INTRAOCULAR LENS PLACEMENT (IOC) LEFT DIABETIC;  Surgeon: Mittie Gaskin, MD;  Location: [facility];  Service: Ophthalmology;  Laterality: Left;  Diabetic - insulin  and oral meds   COLONOSCOPY WITH PROPOFOL  N/A 09/23/2014   Procedure: COLONOSCOPY WITH PROPOFOL ;  Surgeon: Gladis RAYMOND Mariner, MD;  Location: [facility] END[facility];  Service: Endoscopy;  Laterality: N/A;   COLONOSCOPY WITH PROPOFOL  N/A 01/11/2018   Procedure: COLONOSCOPY WITH PROPOFOL ;  Surgeon: Mariner Gladis RAYMOND, MD;  Location: [facility] ENDOSCOPY;  Service: Endoscopy;  Laterality: N/A;   COLONOSCOPY WITH PROPOFOL  N/A 04/24/2018   Procedure: COLONOSCOPY WITH PROPOFOL ;  Surgeon: Mariner Gladis RAYMOND, MD;  Location: [facility] ENDOSCOPY;  Service: Endoscopy;  Laterality: N/A;   COLONOSCOPY WITH PROPOFOL  N/A 11/19/2019   Procedure: COLONOSCOPY WITH PROPOFOL ;  Surgeon: Jinny Carmine, MD;  Location: [facility] ENDOSCOPY;  Service: Endoscopy;  Laterality: N/A;   CORONARY ANGIOPLASTY WITH STENT PLACEMENT  2008   CORONARY STENT INTERVENTION N/A 03/24/2020   Procedure: CORONARY STENT INTERVENTION;  Surgeon: Mady Bruckner, MD;  Location: [facility] INVASIVE CV LAB;  Service: Cardiovascular;  Laterality: N/A;   ESOPHAGOGASTRODUODENOSCOPY (EGD) WITH PROPOFOL  N/A 12/30/2014   Procedure: ESOPHAGOGASTRODUODENOSCOPY (EGD) WITH PROPOFOL ;  Surgeon: Gladis RAYMOND Mariner, MD;  Location: [facility] ENDOSCOPY;  Service:  Endoscopy;  Laterality: N/A;   ESOPHAGOGASTRODUODENOSCOPY (EGD) WITH PROPOFOL  N/A 07/19/2016   Procedure: ESOPHAGOGASTRODUODENOSCOPY (EGD) WITH PROPOFOL ;  Surgeon: Gaylyn Gladis PENNER, MD;  Location: [facility] ENDOSCOPY;  Service: Endoscopy;  Laterality: N/A;   ESOPHAGOGASTRODUODENOSCOPY (EGD) WITH PROPOFOL  N/A 04/24/2018   Procedure: ESOPHAGOGASTRODUODENOSCOPY (EGD) WITH PROPOFOL ;  Surgeon: Gaylyn Gladis PENNER, MD;  Location: [facility] ENDOSCOPY;   Service: Endoscopy;  Laterality: N/A;   EYE SURGERY  cataracts, detached retina   hysterectomy (other)     LEFT HEART CATH AND CORONARY ANGIOGRAPHY N/A 03/24/2020   Procedure: LEFT HEART CATH AND CORONARY ANGIOGRAPHY;  Surgeon: Mady Bruckner, MD;  Location: [facility] INVASIVE CV LAB;  Service: Cardiovascular;  Laterality: N/A;   Patient Active Problem List   Diagnosis Date Noted   Stroke (HCC) 08/05/2023   Depression with anxiety 08/05/2023   Overweight (BMI 25.0-29.9) 08/05/2023   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) 03/17/2023   Dizziness 02/23/2023   Restrictive airway disease 07/21/2020   Partial thickness rotator cuff tear 03/20/2020   Shoulder pain 03/05/2020   Ataxia 02/28/2020   Difficulty walking 02/28/2020   Physical deconditioning 02/28/2020   Chronic diastolic congestive heart failure (HCC) 11/20/2019   Polyp of ascending colon    Hypokalemia 08/08/2019   TIA (transient ischemic attack) 08/01/2019   Benign hypertensive kidney disease with chronic kidney disease 04/25/2019   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/24/2018   Chronic, continuous use of opioids 03/26/2018   History of adenomatous polyp of colon 01/22/2018   Diverticulosis of colon 01/22/2018   History of CVA (cerebrovascular accident) 11/08/2016   Weakness of left upper extremity 10/30/2016   AAA (abdominal aortic aneurysm) without rupture 08/12/2016   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   Stage 3b chronic kidney disease (HCC) 07/27/2015   Type 2 diabetes mellitus with diabetic chronic kidney disease (HCC) 10/28/2014   Solitary pulmonary nodule on lung CT 08/20/2014   Allergic rhinitis 07/28/2014   Acid reflux 07/28/2014   Chronic obstructive pulmonary disease (HCC) 07/28/2014   Granuloma annulare    Back pain 11/12/2013   Lumbar scoliosis 10/30/2013   Lumbar radiculopathy 10/30/2013   Lumbar canal stenosis 10/30/2013   Proteinuria 10/22/2013   Smokes with greater  than 30 pack year history 03/21/2011   Edema 08/18/2010   Hyperlipidemia 03/25/2009   Coronary artery disease 03/25/2009   Primary hypertension 03/25/2009    ONSET DATE: 03/17/23 (admitted with stroke), 05/23/23 (referral date)  REFERRING DIAG:  R41.841 (ICD-10-CM) - Cognitive communication deficit  I63.9 (ICD-10-CM) - CVA (cerebral vascular accident) (HCC)  R47.02 (ICD-10-CM) - Dysphasia    THERAPY DIAG:  Aphasia  Apraxia of speech  Cognitive communication deficit  Rationale for Evaluation and Treatment Rehabilitation  SUBJECTIVE:   SUBJECTIVE STATEMENT: Pt alert, pleasant, and cooperative.  Pt accompanied by: self and family member  PERTINENT HISTORY:  Pt is a 78 y.o. female who presents for communication evaluation in setting on stroke. Pt in ED 1/24-1/25/25. Pt d/c'd home with HH ST. PMHx significant of   HTN, CKD stage IIIB, DMT2 on insulin , CAD, chronic smoking, chronic respiratory failure with COPD and on home oxygen , anxiety, HLD. MRI 03/17/23 1. Punctate foci of restricted diffusion in the left posterior  frontal white matter, most likely acute infarcts.  2. Numerous foci of hemosiderin deposition, which are seen in the  deep gray structures but also in the bilateral cerebral hemispheres,  with 1 new focus compared to 02/20/2023. While this could be the  sequela of chronic hypertensive microhemorrhages, this appearance is  concerning for cerebral amyloid angiopathy.   PAIN:  Are you having pain? No  FALLS: defer to PT  LIVING ENVIRONMENT: Lives with: lives with their spouse Lives in: House/apartment  PLOF:  Level of assistance: Independent with ADLs Employment: Retired   PATIENT GOALS    for communication to improve   OBJECTIVE:  TODAY'S TREATMENT:   Per daughter, pt needs to log BP bid day for MD. Pt practiced copying BP's  which pt successful completed for 8/8 targets with indep self-correction. Discussed use of alarm to remind pt log take/log  BP as well as for other reminders. Alarm set by SLP with pt permission.   Increased difficulty with all tasks this date and reduced spontaneous verbalizations in general.  Daughter endorsed pt with poor PO intake, reduced sleep, increased dizziness, and blood pressure fluctuation. Pt noted that she has less interest in eating and doing anything. ?psychosocial changes due to stroke. Supportive counseling provided; however, pt may benefit from MD f/u. Pt and daughter. Speech today was slow, effortful with s/sx apraxia (sequencing errors), anomia, and dysarthria (articulatory imprecision) - similar to previous session.      PATIENT EDUCATION: Education details: as above Person educated: Patient and Child Education method: Explanation Education comprehension: verbalized understanding and needs further education  HOME EXERCISE PROGRAM:    Use SGD for HEP    GOALS:  Goals reviewed with patient? Yes  SHORT TERM GOALS: Target date: 10 sessions  Pt will participate in further assessment of functional reading/writing. Baseline: Goal status: MET  2.  With Moderate A, patient will complete a semantic feature analysis with at least 2 relevant features for 8/10 target words to improve word-finding skills.  Baseline:  Goal status: PROGRESSING  3.  With Maximal A, patient will generate sentences with 3 or more words in response to a situation at 80% accuracy in order to increase ability to communicate basic wants and needs.  Baseline:  Goal status: PROGRESSING  4.   With Maximal A,  pt will follow 2-step commands for improved participation in ADLs/iADLs with max cueing. Baseline:  Goal status: INITIAL  5.  Pt will repeat short sentences (<4 words) with good approximations 80% of the time with max cueing. Baseline:  Goal status: INITIAL    LONG TERM GOALS: Target date: 12 weeks  Pt will report a subjective improvement in communication per PROM.  Baseline: CES 12/32 on 8/26 Goal  status: INITIAL  2.  With Min A, patient/family will demonstrate understanding of the following concepts: aphasia, spontaneous recovery, communication vs conversation, strengths/strategies to promote success, local resources in order to increase patient's participation in medical care.    Baseline:  Goal status: INITIAL   ASSESSMENT:  CLINICAL IMPRESSION: Pt is a 78 y.o. female who presents for communication treatment in setting on stroke. Pt known to SLP services and was receiving OP ST prior to most recent stroke 08/05/23. Pt d/c'd home with [facility] ST who is working on securing SGD for pt. PMHx significant of  HTN, CKD stage IIIB, DMT2 on insulin , CAD, chronic smoking, chronic respiratory failure with COPD and on home oxygen , anxiety, HLD. Assessment this episode completed via functional/dynamic means including Western Aphasia Battery Revised (WAB-R) and PROM (Communication Effectiveness Scale). 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 See details of tx session above. Recommend course of ST targeting functional communication, further assessment of functional reading/writing, and pt/caregiver training to help promote QoL and overall  life participation.   OBJECTIVE IMPAIRMENTS include expressive language, receptive language, and aphasia. These impairments are limiting patient from ADLs/IADLs and effectively communicating at home and in community. Factors affecting potential to achieve goals and functional outcome are severity of impairments. Patient will benefit from skilled SLP services to address above impairments and improve overall function.  REHAB POTENTIAL: Good  PLAN: SLP FREQUENCY: 2x/week  SLP DURATION: 12 weeks  PLANNED INTERVENTIONS: Language facilitation, Cueing hierachy, Internal/external aids, Functional tasks, Multimodal communication approach, SLP instruction and feedback, Compensatory  strategies, and Patient/family education    Delon Bangs, M.S., CCC-SLP Speech-Language Pathologist [place] [facility] [phone] [place])  [place] [facility] at [facility] [address] [place], [place], [address] Phone: [phone]   Fax:  [phone]

## 2024-01-04 ENCOUNTER — Ambulatory Visit: Admitting: Physical Therapy

## 2024-01-04 ENCOUNTER — Ambulatory Visit

## 2024-01-08 ENCOUNTER — Ambulatory Visit: Admitting: Student

## 2024-01-08 ENCOUNTER — Ambulatory Visit: Admitting: Occupational Therapy

## 2024-01-09 ENCOUNTER — Ambulatory Visit: Admitting: Occupational Therapy

## 2024-01-09 ENCOUNTER — Ambulatory Visit

## 2024-01-09 ENCOUNTER — Ambulatory Visit: Admitting: Physical Therapy

## 2024-01-10 DIAGNOSIS — R42 Dizziness and giddiness: Secondary | ICD-10-CM | POA: Diagnosis not present

## 2024-01-11 ENCOUNTER — Ambulatory Visit

## 2024-01-11 ENCOUNTER — Ambulatory Visit: Admitting: Physical Therapy

## 2024-01-11 DIAGNOSIS — R278 Other lack of coordination: Secondary | ICD-10-CM | POA: Diagnosis not present

## 2024-01-11 DIAGNOSIS — R482 Apraxia: Secondary | ICD-10-CM

## 2024-01-11 DIAGNOSIS — R4701 Aphasia: Secondary | ICD-10-CM

## 2024-01-11 DIAGNOSIS — M6281 Muscle weakness (generalized): Secondary | ICD-10-CM

## 2024-01-11 DIAGNOSIS — R41841 Cognitive communication deficit: Secondary | ICD-10-CM | POA: Diagnosis not present

## 2024-01-11 DIAGNOSIS — R2681 Unsteadiness on feet: Secondary | ICD-10-CM | POA: Diagnosis not present

## 2024-01-11 NOTE — Therapy (Signed)
 OUTPATIENT SPEECH LANGUAGE PATHOLOGY APHASIA TREATMENT / RECERTIFICATION   Patient Name: Hannah Kirby MRN: [id number] DOB:19-Aug-1945, 78 y.o., female Today's Date: 01/11/2024  PCP: [name] Perry, MD REFERRING PROVIDER: [name] Perry, MD   End of Session - 01/11/24 1450     Visit Number 16    Number of Visits 31    Date for Recertification  02/08/24    Progress Note Due on Visit 20    SLP Start Time 1400    SLP Stop Time  1445    SLP Time Calculation (min) 45 min    Activity Tolerance Patient tolerated treatment well          Past Medical History:  Diagnosis Date   Acute hemorrhagic colitis 08/08/2019   Anemia    C. difficile colitis 08/09/2019   CAD (coronary artery disease)    a. 02/2006 PCI: BMS x 2 to RCA, cath o/w without significant coronary disease; b. nuclear stress test 07/2014: No ischemia/infarct; c. 11/2017 MV: no isch/infarct, EF 55-65%; d. 03/2020 NSTEMI/PCI: LM nl, LAD min irregs, RI 25, small, LCX nl, RCA 30p/m ISR, 99d (3.0x15 Resolute Onyx DES).   Cataract 02/04/2018   Cerebrovascular accident (CVA) (HCC) 03/18/2023   dysarthria   Chronic bronchitis (HCC)    secondary to cigarette smoking   FHx: allergies    Goiter    Granulomatous disease    Hernia    Kidney stone on left side 2013   NSTEMI (non-ST elevated myocardial infarction) (HCC) 03/23/2020   Oxygen  deficiency    Panic attacks    PVC's (premature ventricular contractions)    a. 03/2018 Zio: Occas PVCs (2.5%). Triggered events assoc w/ PVC/PAC.   Retinal detachment, left 03/04/2020   Retinal tear 2020   Stroke (HCC) 10/29/2016   mild left side weakness   Stroke (HCC) 08/01/2019   Tobacco abuse    Past Surgical History:  Procedure Laterality Date   ABDOMINAL HYSTERECTOMY     BREAST SURGERY     CATARACT EXTRACTION W/PHACO Right 03/01/2017   Procedure: CATARACT EXTRACTION PHACO AND INTRAOCULAR LENS PLACEMENT (IOC) RIGHT DIABETIC;  Surgeon: Mittie Gaskin, MD;  Location: [facility]  SURGERY CNTR;  Service: Ophthalmology;  Laterality: Right;   CATARACT EXTRACTION W/PHACO Left 03/22/2017   Procedure: CATARACT EXTRACTION PHACO AND INTRAOCULAR LENS PLACEMENT (IOC) LEFT DIABETIC;  Surgeon: Mittie Gaskin, MD;  Location: [facility] SURGERY [facility];  Service: Ophthalmology;  Laterality: Left;  Diabetic - insulin  and oral meds   COLONOSCOPY WITH PROPOFOL  N/A 09/23/2014   Procedure: COLONOSCOPY WITH PROPOFOL ;  Surgeon: Gladis RAYMOND Mariner, MD;  Location: [facility] ENDOSCOPY;  Service: Endoscopy;  Laterality: N/A;   COLONOSCOPY WITH PROPOFOL  N/A 01/11/2018   Procedure: COLONOSCOPY WITH PROPOFOL ;  Surgeon: Mariner Gladis RAYMOND, MD;  Location: [facility] ENDOSCOPY;  Service: Endoscopy;  Laterality: N/A;   COLONOSCOPY WITH PROPOFOL  N/A 04/24/2018   Procedure: COLONOSCOPY WITH PROPOFOL ;  Surgeon: Mariner Gladis RAYMOND, MD;  Location: [facility] ENDOSCOPY;  Service: Endoscopy;  Laterality: N/A;   COLONOSCOPY WITH PROPOFOL  N/A 11/19/2019   Procedure: COLONOSCOPY WITH PROPOFOL ;  Surgeon: Jinny Carmine, MD;  Location: [facility] ENDOSCOPY;  Service: Endoscopy;  Laterality: N/A;   CORONARY ANGIOPLASTY WITH STENT PLACEMENT  2008   CORONARY STENT INTERVENTION N/A 03/24/2020   Procedure: CORONARY STENT INTERVENTION;  Surgeon: Mady Bruckner, MD;  Location: [facility] INVASIVE CV LAB;  Service: Cardiovascular;  Laterality: N/A;   ESOPHAGOGASTRODUODENOSCOPY (EGD) WITH PROPOFOL  N/A 12/30/2014   Procedure: ESOPHAGOGASTRODUODENOSCOPY (EGD) WITH PROPOFOL ;  Surgeon: Gladis RAYMOND Mariner, MD;  Location: [facility] ENDOSCOPY;  Service:  Endoscopy;  Laterality: N/A;   ESOPHAGOGASTRODUODENOSCOPY (EGD) WITH PROPOFOL  N/A 07/19/2016   Procedure: ESOPHAGOGASTRODUODENOSCOPY (EGD) WITH PROPOFOL ;  Surgeon: Gaylyn Gladis PENNER, MD;  Location: [facility] ENDOSCOPY;  Service: Endoscopy;  Laterality: N/A;   ESOPHAGOGASTRODUODENOSCOPY (EGD) WITH PROPOFOL  N/A 04/24/2018   Procedure: ESOPHAGOGASTRODUODENOSCOPY (EGD) WITH PROPOFOL ;  Surgeon: Gaylyn Gladis PENNER, MD;   Location: [facility] ENDOSCOPY;  Service: Endoscopy;  Laterality: N/A;   EYE SURGERY  cataracts, detached retina   hysterectomy (other)     LEFT HEART CATH AND CORONARY ANGIOGRAPHY N/A 03/24/2020   Procedure: LEFT HEART CATH AND CORONARY ANGIOGRAPHY;  Surgeon: Mady Bruckner, MD;  Location: [facility] INVASIVE CV LAB;  Service: Cardiovascular;  Laterality: N/A;   Patient Active Problem List   Diagnosis Date Noted   Stroke (HCC) 08/05/2023   Depression with anxiety 08/05/2023   Overweight (BMI 25.0-29.9) 08/05/2023   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) 03/17/2023   Dizziness 02/23/2023   Restrictive airway disease 07/21/2020   Partial thickness rotator cuff tear 03/20/2020   Shoulder pain 03/05/2020   Ataxia 02/28/2020   Difficulty walking 02/28/2020   Physical deconditioning 02/28/2020   Chronic diastolic congestive heart failure (HCC) 11/20/2019   Polyp of ascending colon    Hypokalemia 08/08/2019   TIA (transient ischemic attack) 08/01/2019   Benign hypertensive kidney disease with chronic kidney disease 04/25/2019   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/24/2018   Chronic, continuous use of opioids 03/26/2018   History of adenomatous polyp of colon 01/22/2018   Diverticulosis of colon 01/22/2018   History of CVA (cerebrovascular accident) 11/08/2016   Weakness of left upper extremity 10/30/2016   AAA (abdominal aortic aneurysm) without rupture 08/12/2016   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   Stage 3b chronic kidney disease (HCC) 07/27/2015   Type 2 diabetes mellitus with diabetic chronic kidney disease (HCC) 10/28/2014   Solitary pulmonary nodule on lung CT 08/20/2014   Allergic rhinitis 07/28/2014   Acid reflux 07/28/2014   Chronic obstructive pulmonary disease (HCC) 07/28/2014   Granuloma annulare    Back pain 11/12/2013   Lumbar scoliosis 10/30/2013   Lumbar radiculopathy 10/30/2013   Lumbar canal stenosis 10/30/2013   Proteinuria  10/22/2013   Smokes with greater than 30 pack year history 03/21/2011   Edema 08/18/2010   Hyperlipidemia 03/25/2009   Coronary artery disease 03/25/2009   Primary hypertension 03/25/2009    ONSET DATE: 03/17/23 (admitted with stroke), 05/23/23 (referral date)  REFERRING DIAG:  R41.841 (ICD-10-CM) - Cognitive communication deficit  I63.9 (ICD-10-CM) - CVA (cerebral vascular accident) (HCC)  R47.02 (ICD-10-CM) - Dysphasia    THERAPY DIAG:  Aphasia  Apraxia of speech  Rationale for Evaluation and Treatment Rehabilitation  SUBJECTIVE:   SUBJECTIVE STATEMENT: Pt alert, pleasant, and cooperative.  Pt accompanied by: self and family member  PERTINENT HISTORY:  Pt is a 78 y.o. female who presents for communication evaluation in setting on stroke. Pt in ED 1/24-1/25/25. Pt d/c'd home with HH ST. PMHx significant of   HTN, CKD stage IIIB, DMT2 on insulin , CAD, chronic smoking, chronic respiratory failure with COPD and on home oxygen , anxiety, HLD. MRI 03/17/23 1. Punctate foci of restricted diffusion in the left posterior  frontal white matter, most likely acute infarcts.  2. Numerous foci of hemosiderin deposition, which are seen in the  deep gray structures but also in the bilateral cerebral hemispheres,  with 1 new focus compared to 02/20/2023. 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   PAIN:  Are you having pain? No  FALLS: defer to PT  LIVING ENVIRONMENT: Lives with: lives with their spouse Lives in: House/apartment  PLOF:  Level of assistance: Independent with ADLs Employment: Retired   PATIENT GOALS    for communication to improve   OBJECTIVE:  TODAY'S TREATMENT:    Aphasia evaluation: Formal re-assessment completed today via Western Aphasia Battery Revised with scores as follows:   Western Aphasia Battery- Revised  Spontaneous Speech                            Information content               5/10                                            Fluency                                 4/10                                          Comprehension     Yes/No questions                 57/60                                           Auditory Word Recognition  60/60                                Sequential Commands       72/80                              Repetition                             76/100                                        Naming    Object Naming                     53/60                                           Word Fluency                        7/20                                            Sentence Completion  10/10                                             Responsive Speech              10/10                                         Aphasia Quotient                  68.5/100         Pt's severity rating was moderate as indicated by an Aphasia Quotient of 68.5 (0-25=very severe, 26-50=severe, 51-75=moderate, 76 and above is mild). 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   Education/speech tx: Pt and daughter educated at [facility] re: results of assessment, SLP POC (including trial of 4 weeks of therapy with pt agreeing to complete HEP and attend regularly), ways to promote speech/language/cognitive functioning outside of therapy with emphasis on the importance of diet, sleep, managing conditions, and regular cognitive stimulation.      PATIENT EDUCATION: Education details: as above Person educated: Patient and Child Education method: Explanation Education comprehension: verbalized understanding and needs further education  HOME EXERCISE PROGRAM:    Use SGD for HEP  Talk at home!      GOALS:  Goals reviewed with patient? Yes  SHORT TERM GOALS: Target date: 10 sessions  Pt will participate in further assessment of functional reading/writing. Baseline: Goal  status: MET  2.  With Moderate A, patient will complete a semantic feature analysis with at least 2 relevant features for 8/10 target words to improve word-finding skills.  Baseline:  Goal status: PROGRESSING; continue goal on 01/11/24  3.  With Maximal A, patient will generate sentences with 3 or more words in response to a situation at 80% accuracy in order to increase ability to communicate basic wants and needs.  Baseline:  Goal status: PROGRESSING; continue goal on 01/11/24  4.   With Maximal A,  pt will follow 2-step commands for improved participation in ADLs/iADLs with max cueing. Baseline:  Goal status: MET  5.  Pt will repeat short sentences (<4 words) with good approximations 80% of the time with max cueing. Baseline:  Goal status: MET     LONG TERM GOALS: Target date: 4 weeks; 02/08/24  Pt will report a subjective improvement in communication per PROM.  Baseline: CES 12/32 on 8/26 Goal status: INITIAL; continue goal  2.  With Min A, patient/family will demonstrate understanding of the following concepts: aphasia, spontaneous recovery, communication vs conversation, strengths/strategies to promote success, local resources in order to increase patient's participation in medical care.    Baseline:  Goal status: MET  3. Pt and/or daughter will demonstrate understanding of multiple ways to stimulate pt cognitively outside of ST.   Baseline:  Goal status: INITIAL   ASSESSMENT:  CLINICAL IMPRESSION: Pt is a 78 y.o. female who presents for communication treatment in setting on stroke. Pt known to SLP services and was receiving OP ST prior to most recent stroke 08/05/23. Pt d/c'd home with [facility] who is working on securing SGD for pt. PMHx significant of  HTN, CKD stage IIIB, DMT2 on insulin , CAD, chronic smoking, chronic respiratory failure with COPD and  on home oxygen , anxiety, HLD. Re-assessment completed 01/10/34 via formal assessment Western Aphasia Battery Revised  (WAB-R). 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 Noted improvement in pt's auditory comprehension and repetition; however, difficulty with spontaneous speech and naming/wordfinding persist. Course of therapy, thus far this episode, has be limited by pt's illness with patient missing 8 of 23 scheduled sessions and pt's limited support/cognitive stimulationat home. Recommend continued course of skilled ST on a trial basis for the next 4 weeks with pt in agreement to complete home assignments and attend therapy sessions regularly. See details of tx session above. Recommend course of ST targeting functional communication, further assessment of functional reading/writing, and pt/caregiver training to help promote QoL and overall life participation.   OBJECTIVE IMPAIRMENTS include expressive language, receptive language, and aphasia. These impairments are limiting patient from ADLs/IADLs and effectively communicating at home and in community. Factors affecting potential to achieve goals and functional outcome are severity of impairments. Patient will benefit from skilled SLP services to address above impairments and improve overall function.  REHAB POTENTIAL: Good  PLAN: SLP FREQUENCY: 2x/week  SLP DURATION: 4 weeks  PLANNED INTERVENTIONS: Language facilitation, Cueing hierachy, Internal/external aids, Functional tasks, Multimodal communication approach, SLP instruction and feedback, Compensatory strategies, and Patient/family education    Delon Bangs, M.S., CCC-SLP Speech-Language Pathologist [place] - [facility] [phone] [place])  [place] [facility] at [facility] [address] [place], [place], [address] Phone: [phone]   Fax:  [phone]

## 2024-01-11 NOTE — Therapy (Signed)
 OUTPATIENT OCCUPATIONAL THERAPY NEURO TREATMENT NOTE  Patient Name: Hannah Kirby MRN: [id number] DOB:February 06, 1946, 78 y.o., female Today's Date: 01/11/2024  PCP: Dr. [name] Perry REFERRING PROVIDER: Dr. [name] Perry  END OF SESSION:  OT End of Session - 01/11/24 1431     Visit Number 11    Number of Visits 24    Date for Recertification  01/22/24    OT Start Time 1445    OT Stop Time 1530    OT Time Calculation (min) 45 min    Equipment Utilized During Treatment transport chair    Activity Tolerance Patient tolerated treatment well    Behavior During Therapy WFL for tasks assessed/performed          Past Medical History:  Diagnosis Date   Acute hemorrhagic colitis 08/08/2019   Anemia    C. difficile colitis 08/09/2019   CAD (coronary artery disease)    a. 02/2006 PCI: BMS x 2 to RCA, cath o/w without significant coronary disease; b. nuclear stress test 07/2014: No ischemia/infarct; c. 11/2017 MV: no isch/infarct, EF 55-65%; d. 03/2020 NSTEMI/PCI: LM nl, LAD min irregs, RI 25, small, LCX nl, RCA 30p/m ISR, 99d (3.0x15 Resolute Onyx DES).   Cataract 02/04/2018   Cerebrovascular accident (CVA) (HCC) 03/18/2023   dysarthria   Chronic bronchitis (HCC)    secondary to cigarette smoking   FHx: allergies    Goiter    Granulomatous disease    Hernia    Kidney stone on left side 2013   NSTEMI (non-ST elevated myocardial infarction) (HCC) 03/23/2020   Oxygen  deficiency    Panic attacks    PVC's (premature ventricular contractions)    a. 03/2018 Zio: Occas PVCs (2.5%). Triggered events assoc w/ PVC/PAC.   Retinal detachment, left 03/04/2020   Retinal tear 2020   Stroke (HCC) 10/29/2016   mild left side weakness   Stroke (HCC) 08/01/2019   Tobacco abuse    Past Surgical History:  Procedure Laterality Date   ABDOMINAL HYSTERECTOMY     BREAST SURGERY     CATARACT EXTRACTION W/PHACO Right 03/01/2017   Procedure: CATARACT EXTRACTION PHACO AND INTRAOCULAR LENS PLACEMENT  (IOC) RIGHT DIABETIC;  Surgeon: Mittie Gaskin, MD;  Location: [facility];  Service: Ophthalmology;  Laterality: Right;   CATARACT EXTRACTION W/PHACO Left 03/22/2017   Procedure: CATARACT EXTRACTION PHACO AND INTRAOCULAR LENS PLACEMENT (IOC) LEFT DIABETIC;  Surgeon: Mittie Gaskin, MD;  Location: [facility];  Service: Ophthalmology;  Laterality: Left;  Diabetic - insulin  and oral meds   COLONOSCOPY WITH PROPOFOL  N/A 09/23/2014   Procedure: COLONOSCOPY WITH PROPOFOL ;  Surgeon: Gladis RAYMOND Mariner, MD;  Location: [facility] ENDOSCOPY;  Service: Endoscopy;  Laterality: N/A;   COLONOSCOPY WITH PROPOFOL  N/A 01/11/2018   Procedure: COLONOSCOPY WITH PROPOFOL ;  Surgeon: Mariner Gladis RAYMOND, MD;  Location: [facility] ENDOSCOPY;  Service: Endoscopy;  Laterality: N/A;   COLONOSCOPY WITH PROPOFOL  N/A 04/24/2018   Procedure: COLONOSCOPY WITH PROPOFOL ;  Surgeon: Mariner Gladis RAYMOND, MD;  Location: [facility] ENDOSCOPY;  Service: Endoscopy;  Laterality: N/A;   COLONOSCOPY WITH PROPOFOL  N/A 11/19/2019   Procedure: COLONOSCOPY WITH PROPOFOL ;  Surgeon: Jinny Carmine, MD;  Location: [facility] ENDOSCOPY;  Service: Endoscopy;  Laterality: N/A;   CORONARY ANGIOPLASTY WITH STENT PLACEMENT  2008   CORONARY STENT INTERVENTION N/A 03/24/2020   Procedure: CORONARY STENT INTERVENTION;  Surgeon: Mady Bruckner, MD;  Location: [facility] INVASIVE CV LAB;  Service: Cardiovascular;  Laterality: N/A;   ESOPHAGOGASTRODUODENOSCOPY (EGD) WITH PROPOFOL  N/A 12/30/2014   Procedure: ESOPHAGOGASTRODUODENOSCOPY (EGD) WITH PROPOFOL ;  Surgeon:  Gladis RAYMOND Mariner, MD;  Location: [facility] ENDOSCOPY;  Service: Endoscopy;  Laterality: N/A;   ESOPHAGOGASTRODUODENOSCOPY (EGD) WITH PROPOFOL  N/A 07/19/2016   Procedure: ESOPHAGOGASTRODUODENOSCOPY (EGD) WITH PROPOFOL ;  Surgeon: Mariner Gladis RAYMOND, MD;  Location: [facility] ENDOSCOPY;  Service: Endoscopy;  Laterality: N/A;   ESOPHAGOGASTRODUODENOSCOPY (EGD) WITH PROPOFOL  N/A 04/24/2018   Procedure:  ESOPHAGOGASTRODUODENOSCOPY (EGD) WITH PROPOFOL ;  Surgeon: Mariner Gladis RAYMOND, MD;  Location: [facility] ENDOSCOPY;  Service: Endoscopy;  Laterality: N/A;   EYE SURGERY  cataracts, detached retina   hysterectomy (other)     LEFT HEART CATH AND CORONARY ANGIOGRAPHY N/A 03/24/2020   Procedure: LEFT HEART CATH AND CORONARY ANGIOGRAPHY;  Surgeon: Mady Bruckner, MD;  Location: [facility] INVASIVE CV LAB;  Service: Cardiovascular;  Laterality: N/A;   Patient Active Problem List   Diagnosis Date Noted   Stroke (HCC) 08/05/2023   Depression with anxiety 08/05/2023   Overweight (BMI 25.0-29.9) 08/05/2023   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) 03/17/2023   Dizziness 02/23/2023   Restrictive airway disease 07/21/2020   Partial thickness rotator cuff tear 03/20/2020   Shoulder pain 03/05/2020   Ataxia 02/28/2020   Difficulty walking 02/28/2020   Physical deconditioning 02/28/2020   Chronic diastolic congestive heart failure (HCC) 11/20/2019   Polyp of ascending colon    Hypokalemia 08/08/2019   TIA (transient ischemic attack) 08/01/2019   Benign hypertensive kidney disease with chronic kidney disease 04/25/2019   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/24/2018   Chronic, continuous use of opioids 03/26/2018   History of adenomatous polyp of colon 01/22/2018   Diverticulosis of colon 01/22/2018   History of CVA (cerebrovascular accident) 11/08/2016   Weakness of left upper extremity 10/30/2016   AAA (abdominal aortic aneurysm) without rupture 08/12/2016   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   Stage 3b chronic kidney disease (HCC) 07/27/2015   Type 2 diabetes mellitus with diabetic chronic kidney disease (HCC) 10/28/2014   Solitary pulmonary nodule on lung CT 08/20/2014   Allergic rhinitis 07/28/2014   Acid reflux 07/28/2014   Chronic obstructive pulmonary disease (HCC) 07/28/2014   Granuloma annulare    Back pain 11/12/2013   Lumbar scoliosis 10/30/2013   Lumbar  radiculopathy 10/30/2013   Lumbar canal stenosis 10/30/2013   Proteinuria 10/22/2013   Smokes with greater than 30 pack year history 03/21/2011   Edema 08/18/2010   Hyperlipidemia 03/25/2009   Coronary artery disease 03/25/2009   Primary hypertension 03/25/2009   ONSET DATE: 08/05/2023  REFERRING DIAG: M70.101 (ICD-10-CM) - Weakness of left upper extremity   THERAPY DIAG:  Other lack of coordination  Muscle weakness (generalized)  Rationale for Evaluation and Treatment: Rehabilitation  SUBJECTIVE:  SUBJECTIVE STATEMENT: Pt reports fatigue and difficulty engaging in meals  Pt accompanied by: family member: daughter, Harrie  PERTINENT HISTORY: Daughter present and provided hx.  Daughter reports pt has had 4 strokes, beginning in 2018, 2019, January of 2025 and the most recent in June of 2025.  Pt had been participating in [facility] since the most recent CVA, and is now eager to transition to outpatient services where PT/OT/SLP have been ordered. Per MEDICAL RECORD NUMBERPMH: of CVA, COPD, Dysarthria, Ataxia, TIA, T2DM, Back pain, scoliosis   PRECAUTIONS: Fall, Loop recorder for Afib  WEIGHT BEARING RESTRICTIONS: No  PAIN:  01/11/24: 3/10 back pain 01/03/24: 3/10 low back 01/01/24: 3/10 low back 12/21/2023: 4 out of 10 low back pain 12/11/23: chronic back pain with prolonged standing 3/10 11/21/23: 3/10 chronic back pain 11/08/23: Same as below 11/02/23: same as eval below Are  you having pain? Yes: NPRS scale: 3/10, can get up to 7-8/10 Pain location: low back and L hip Pain description: achy, sharp, dull Aggravating factors: prolonged standing  Relieving factors: sitting, pain meds, heat/ice   FALLS: Has patient fallen in last 6 months? Yes. Number of falls 1, tripping over cords  LIVING ENVIRONMENT: Lives with: lives with their spouse Lives in: 1 level home Stairs: Yes: External: 1 steps; none Has following equipment at home: walk in shower, comfort height toilet   PLOF: Prior to  Jan of this year, pt was managing all home management tasks, was able to drive, and was caring for spouse   PATIENT GOALS: Strengthening the left arm   OBJECTIVE:  Note: Objective measures were completed at Evaluation unless otherwise noted.  HAND DOMINANCE: Right  ADLs: Overall ADLs: Daughter is pt's primary caregiver and lives near by Transfers/ambulation related to ADLs: occasional help with using walker at home, depending on balance on a given day Eating: indep/able to cut food  Grooming: bimanual grooming without difficulty UB Dressing: extra time for clothing fasteners, but able to gather clothing with RW LB Dressing: extra time with clothing fasteners Toileting: occasional help to walk to the bathroom, but modified indep within the bathroom for toileting tasks Bathing: direct supv for bathing using walkin in shower, shower chair, and grab bar; gets hair washed weekly at salon Tub Shower transfers: supv for walk in shower Equipment: RW, SBQC, shower chair, grab bars  IADLs: Shopping: daughter currently managing Light housekeeping: daughter currently managing; can start laundry on a good day.  Meal Prep: starting to participate in light meal prep, including making eggs on stove top Community mobility: daughter assists with all transportation; pt using transport chair to reach therapy gym today Medication management: daughter sets up pills 2 weeks at a time  Financial management: pt is participating in bill paying with daughter's assistance; most are online Handwriting: 100% legible  POSTURE COMMENTS:  rounded shoulders, forward head, increased thoracic kyphosis  ACTIVITY TOLERANCE: Activity tolerance: to be further assessed within functional contexts  FUNCTIONAL OUTCOME MEASURES: TBD  UPPER EXTREMITY ROM:  BUEs WFL for daily tasks  UPPER EXTREMITY MMT:  Hx of R rotator cuff tear 4-5 years ago with residual weakness, non-surgical;    MMT Right eval Right 01/03/24  Left eval Left 01/03/24  Shoulder flexion 3+ 3+ 4 4  Shoulder abduction 3+ 3+ 4 4  Shoulder adduction      Shoulder extension      Shoulder internal rotation 3+ 3+ 4- 4  Shoulder external rotation 3+ 3+ 3+ 4-  Elbow flexion 5 5 5 5   Elbow extension 5 5 5 5   Wrist flexion 4+ 5 5 5   Wrist extension 4+ 5 5 5   Wrist ulnar deviation      Wrist radial deviation      Wrist pronation      Wrist supination      (Blank rows = not tested)   *Most recent CVA affecting L non-dominant side, with LUE presenting with increased strength as compared to dominant/unaffected arm.  Assume RUE weaker from hx of rotator cuff tear, or possibly previous CVAs.  Pt does present with mild LUE ataxia, and bilat hand weakness.  HAND FUNCTION: Grip strength: Right: 32 lbs; Left: 30 lbs, Lateral pinch: Right: 8 lbs, Left: 6 lbs, and 3 point pinch: Right: 7 lbs, Left: 6 lbs Grip strength: Right: 40 lbs; Left: 30 lbs, Lateral pinch: Right: 16 lbs, Left: 12; 3 point pinch:  Right: 18 lbs, Left: 13 lbs   COORDINATION: Finger Nose Finger test: LUE mild ataxia  9 Hole Peg test: Right: 29 sec; Left: 47 sec 01/03/24: Right: 27 sec, Left: 37 sec   SENSATION: Pt reports intermittent numbness in the R thumb, IF, and LF  EDEMA: No visible edema  MUSCLE TONE: RUE: Within functional limits and LUE: Within functional limits  COGNITION: Overall cognitive status: Impaired; see SLP eval for details  VISION: Pt reports L eye feels more blurry since first stroke in 2018; pt has reading glasses but doesn't usually wear them   VISION ASSESSMENT: To be further assessed in functional context Tracking/Visual pursuits: Able to track stimulus in all quads without difficulty Saccades: WFL Visual Fields: no apparent deficits Clock drawing completed: all numbers spaced evenly, but pt left out the 1 and verbalized not knowing how to draw ten minutes to eleven when this direction was given.  Patient has difficulty with following  activities due to following visual impairments: To be further assessed  PERCEPTION: To be further assessed  PRAXIS: Impaired: Motor planning  OBSERVATIONS: Pt pleasant, cooperative.  Daughter present and very supportive in pt's care.                                                                                                                       TREATMENT DATE:01/11/24  Self Care: -VS monitoring: BP 145/89 L arm in sitting, 116/82 in standing; - Pt educated in energy conservation strategies including pursed lip breathing, activity pacing, home/routines modifications, work simplification, AE/DME, prioritizing of meaningful occupations, and falls prevention.  -Pt. worked on transporting items within aflac incorporated, accessing, opening, and retrieving items from the refrigerator. Energy conservation/work simplification techniques were reviewed with the patient. Plan to obtain chair with arm rests in kitchen and place all coffee related items in central space to simplify task.   PATIENT EDUCATION: Education details: Marketing executive Person educated: Patient and Child(ren) Education method: Explanation and Verbal cues Education comprehension: verbalized understanding  HOME EXERCISE PROGRAM: Yellow theraputty  GOALS: Goals reviewed with patient? Yes  SHORT TERM GOALS: Target date: 12/11/23  Pt will perform HEP with min vc or less for improving LUE coordination and bilat hand strength. Baseline: Eval: HEP not yet initiated; 01/03/24: Pt acknowledges minimal participation in HEP in the home, but does have HEP in place with putty and coordination training activities Goal status: in progress  2.  Pt will be indep to verbalize and implement 2-3 fall prevention measures to reduce fall risk with ADLs. Baseline: Eval: Educ not yet initiated; 01/03/24: 1 fall last week after reaching to floor level to pick up soiled dog pad while dizzy; pt able to verbalize 3 fall prevention strategies with  min vc.   Goal status: in progress  LONG TERM GOALS: Target date: 01/22/24  Pt will perform shower transfers with modified indep Baseline: Eval: Direct supv; 01/03/24: direct supv from spouse, which pt plans to continue Goal status: d/c  2.  Pt will improve  LUE GMC to enable reaching with good accuracy into overhead kitchen cabinets. Baseline: Eval: Mild LUE ataxia; 01/03/24: Residual mild LUE ataxia and pt denies attempts at reaching into kitchen cupboards d/t family managing meals Goal status: in progress  3.  Pt will improve L hand [facility] skills as demonstrated by 10 sec or more improvement in 9 hole peg test for improved efficiency when manipulating  clothing fasteners. Baseline: Eval: L 9 hole peg test: L 47 sec, R 29 sec; 01/03/24: L 37 sec, R 27 sec, indep with clothing fasteners Goal status: achieved  4.  Pt will manage light loads of laundry with modified indep. Baseline: Eval: Daughter mostly manages, but pt can start laundry on a good day; 01/03/24: Pt can start laundry, and has been given recommendations for safe reaching strategies and item transport for increasing indep with this task, but has not yet attempted these strategies at home.  Pt continues to rely on family to manage laundry tasks. Goal status: in progress  5. Pt will perform hot or cold light meal prep with distant supv and RW for at least 1 meal per day.  Baseline: 01/03/24: Family currently manages meal preparation  Goal status: New  ASSESSMENT: CLINICAL IMPRESSION: Continues to reports poor intake - request to report frequency of meals for the next week. Educated on ECS including positioning, priority, and work simplification - plan to obtain chair with arm rests. Pt reports she is minimally likely to utilize strategies but daughter is encouraging pt. OT reinforced importance of condition management as stated above in treatment note in order to positively impact pt's progress towards goals and pt's overall  ability to increase participation in ADL/IADL tasks.  Anticipate readiness to d/c end of cert if pt is unable to increase carry over with committing to eating 3 meals a day, increasing hydration, and increasing activity level and ADL participation in the home.    PERFORMANCE DEFICITS: in functional skills including ADLs, IADLs, coordination, dexterity, sensation, ROM, strength, pain, Fine motor control, Gross motor control, mobility, balance, body mechanics, endurance, decreased knowledge of precautions, decreased knowledge of use of DME, vision, and UE functional use, cognitive skills including emotional, perception, problem solving, safety awareness, temperament/personality, and understand, and psychosocial skills including coping strategies, environmental adaptation, and routines and behaviors.   IMPAIRMENTS: are limiting patient from ADLs, IADLs, leisure, and social participation.   CO-MORBIDITIES: has co-morbidities such as afib, AAA, hx of multiple CVAs that affects occupational performance. Patient will benefit from skilled OT to address above impairments and improve overall function.  MODIFICATION OR ASSISTANCE TO COMPLETE EVALUATION: Min-Moderate modification of tasks or assist with assess necessary to complete an evaluation.  OT OCCUPATIONAL PROFILE AND HISTORY: Detailed assessment: Review of records and additional review of physical, cognitive, psychosocial history related to current functional performance.  CLINICAL DECISION MAKING: Moderate - several treatment options, min-mod task modification necessary  REHAB POTENTIAL: Good  EVALUATION COMPLEXITY: Moderate    PLAN:  OT FREQUENCY: 2x/week  OT DURATION: 12 weeks  PLANNED INTERVENTIONS: 97168 OT Re-evaluation, 97535 self care/ADL training, 02889 therapeutic exercise, 97530 therapeutic activity, 97112 neuromuscular re-education, 97140 manual therapy, 97116 gait training, 02989 moist heat, 97010 cryotherapy, 97129 Cognitive  training (first 15 min), 02249 Physical Performance Testing, balance training, functional mobility training, visual/perceptual remediation/compensation, psychosocial skills training, energy conservation, coping strategies training, patient/family education, and DME and/or AE instructions  RECOMMENDED OTHER SERVICES: none at this time (OT/PT/SLP services have all been ordered)  CONSULTED AND AGREED WITH PLAN OF CARE: Patient  and family member/caregiver  PLAN FOR NEXT SESSION: see above  Elston Slot, M.S. OTR/L  01/11/24, 2:32 PM  ascom [phone]   01/11/2024, 2:32 PM

## 2024-01-15 ENCOUNTER — Ambulatory Visit: Admitting: Occupational Therapy

## 2024-01-15 ENCOUNTER — Ambulatory Visit: Admission: RE | Admit: 2024-01-15 | Source: Ambulatory Visit

## 2024-01-15 ENCOUNTER — Ambulatory Visit

## 2024-01-16 ENCOUNTER — Ambulatory Visit

## 2024-01-16 ENCOUNTER — Ambulatory Visit: Admitting: Occupational Therapy

## 2024-01-16 ENCOUNTER — Ambulatory Visit: Admitting: Physical Therapy

## 2024-01-17 ENCOUNTER — Ambulatory Visit: Admitting: Family Medicine

## 2024-01-17 ENCOUNTER — Ambulatory Visit: Admission: RE | Admit: 2024-01-17 | Source: Ambulatory Visit

## 2024-01-21 ENCOUNTER — Ambulatory Visit

## 2024-01-22 ENCOUNTER — Ambulatory Visit: Admitting: Family Medicine

## 2024-01-22 NOTE — Progress Notes (Unsigned)
 Cardiology Clinic Note   Date: 01/24/2024 ID: OLIVINE HIERS, DOB Nov 28, 1945, MRN [id number]  Primary Cardiologist:  Evalene Lunger, MD  Chief Complaint   Hannah Kirby is a 78 y.o. female who presents to the clinic today for routine follow up.   Patient Profile   Hannah Kirby is followed by Dr. Gollan for the history outlined below.      Past medical history significant for: CAD. PCI with BMS x 2 to RCA January 2008. LHC 03/24/2020 (NSTEMI): Severe single-vessel CAD with 99% distal RCA stenosis.  Mild, nonobstructive CAD with minimal luminal irregularities involving the LAD and 20 to 30% ostial ramus stenosis.  Patent proximal/mid RCA stents with mild to moderate in-stent restenosis (~30%).  Mildly elevated LVEDP.  PCI with DES 3 x 15 mm to distal RCA. Chronic diastolic heart failure. Echo 08/06/2023: EF 65 to 70%.  Regional wall motion unable to be evaluated.  Mild LVH.  Grade I DD.  Normal RV size/function.  Aortic valve sclerosis/calcification without stenosis. AAA. AAA duplex 02/11/2022: Abnormal dilatation of proximal abdominal aorta, 3.8 cm.  Prior ultrasound with measurement 3.5 cm December 2022. Carotid artery disease. Carotid ultrasound 03/18/2023: Mild amount of calcified plaque at the level of both carotid bulbs and proximal internal carotid arteries.  Bilateral ICA stenosis <50%. Hypertension. Hyperlipidemia. Lipid panel 08/06/2023: LDL 46, HDL 48, TG 244, total 143.  COPD. CVA. 2018 and 2021. MRI brain 03/17/2023: Punctate foci of restricted diffusion in the left posterior frontal white matter, most likely acute infarcts. Numerous foci of hemosiderin deposition, which are seen in the deep gray structures but also in the bilateral cerebral hemispheres, with 1 new focus compared to 02/20/2023. While this could be the sequela of chronic hypertensive microhemorrhages, this appearance is concerning for cerebral amyloid angiopathy. MRA head 03/18/2023: Strong evidence of  severe stenosis of the distal left MCA M1 segment.  Left MCA bifurcation appears patent bedside.  No discrete branch occlusion when compared to 2018.  Positive also for severe stenosis or occlusion of the right ACA in the distal A2/A3 segment new since 2018.  Stable MRA elsewhere no other intracranial artery stenosis identified. 30-day ZIO March 2025: HR 56 to 169 bpm, average 70 bpm.  1 run of NSVT 5 beats max rate 128 bpm.  2 runs of SVT lasting 5 beats max rate 169 bpm.  Rare ectopy.  No sustained arrhythmias. 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 10-day ZIO 08/31/2023: HR 50 to 136 bpm, average 67 bpm, 1 run of NSVT lasting 6 beats.  No A-fib. Rare ventricular ectopy. No sustained arrhythmias. Loop recorder implantation 08/17/2023. Remote device check 12/20/2023: Normal device function.  No new symptoms, tacky, bradycardia, pauses or AF episodes.  Battery status okay.   T2DM. CKD stage IIIb. Tobacco abuse.  In summary, Hannah Kirby is a longtime patient of Dr. Gollan.  She has a history of CAD with PCI with BMS x 2 to RCA in January 2008.  Nuclear stress testing June 2016 and October 2019 were normal, low risk studies.  She has a history of mild carotid artery disease.  She undergoes yearly ultrasound for surveillance of AAA with last measurement 3.8 cm in December 2023.  In February 2022 she underwent LHC in the setting of NSTEMI and PCI was performed to RCA as detailed above.  Echo at that time showed EF 60 to 65%, no RWMA, moderate LVH, Grade I DD, normal RV size/function, mild aortic valve sclerosis without stenosis.  Patient was last seen  in the office by Dr. Gollan on 08/16/2022 for routine follow-up and preoperative risk assessment prior to lumbar epidural steroid injection.  She was doing well at that time with no cardiac complaints.  Patient presented to [facility] ED on 03/17/2023 with complaints of slurred speech, trouble with words, unsteady gait that began 2 days prior.  Initial labs: WBC 7.6,  hemoglobin 17.7, sodium 142, potassium 3.7, creatinine 1.41, BUN 17, respiratory panel negative for COVID/flu/RSV.  Troponin negative x 2.  Chest x-ray without active cardiopulmonary disease.  MRI brain showed punctate foci of restricted diffusion in the left posterior frontal white matter most likely acute infarcts (further details above).  Patient was discharged on 03/18/2023 to follow-up with PCP and neurology as an outpatient.  She was instructed to follow-up with cardiology to get an echo to complete stroke workup.  She was seen in follow-up with no complaints.  30-day monitor revealed no sustained arrhythmias.  Patient was last seen in the office by me on 06/26/2023 for follow-up after testing.  She continued to work with outpatient speech therapy improvement.  She completed in-home PT and was awaiting for referral for outpatient physical therapy as patient still had generalized weakness more prominent on the left side.  No medication changes were made.  Patient presented to the ED on 08/05/2023 for increased weakness. MRI showed small acute infarcts in the left frontal lobe, right parietal lobe and left cerebellum.  Echo demonstrated normal LV/RV function. She was discharged on 6/16 to follow up with neurology. She was referred to EP for loop recorder implantation which was performed on 08/17/2023.      History of Present Illness    Today, patient is accompanied by her daughter. Patient denies lower extremity edema, orthopnea or PND. No chest pain, pressure, or tightness. No palpitations.  She has chronic dyspnea with exertion unchanged from previous. She has supplemental O2 at home but does not use it secondary to it irritating her. She continues to have left sided weakness and aphasia after her stroke. Her biggest concern today is positional dizziness. She reports spinning sensation with laying down to sitting and sit to stand. She states it does not last long. She has tried meclizine  without  improvement. Her PCP wanted her to keep a BP log and then follow up with him but she has not been doing that. She has been holding off on physical therapy for the last 3 weeks because of her symptoms. She admits that she does not drink well throughout the day. Patient's daughter is also concerned that patient could be experiencing some dementia. She states patient's memory is not good with her often having to repeat herself and remind her mom about conversations they had. She mentioned it to neurology who did a memory test but did not discuss next steps.     ROS: All other systems reviewed and are otherwise negative except as noted in History of Present Illness.  EKGs/Labs Reviewed       EKG is not performed today.   11/04/2023: ALT 14; AST 22; BUN 19; Creatinine, Ser 1.42; Potassium 4.2; Sodium 140   11/04/2023: Hemoglobin 16.8; WBC 9.3   08/05/2023: B Natriuretic Peptide 117.0    Risk Assessment/Calculations      HYPERTENSION CONTROL Vitals:   01/24/24 1353 01/24/24 1638  BP: (!) 158/92 (!) 142/80    The patient's blood pressure is elevated above target today.  In order to address the patient's elevated BP: Follow up with primary care provider for management.  Physical Exam    VS:  BP (!) 142/80 (BP Location: Left Arm, Patient Position: Sitting, Cuff Size: Normal)   Pulse 67   Ht 5' 6 (1.676 m)   Wt 161 lb (73 kg)   SpO2 94%   BMI 25.99 kg/m  , BMI Body mass index is 25.99 kg/m.  Orthostatic VS for the past 24 hrs (Last 3 readings):  BP- Lying Pulse- Lying BP- Sitting Pulse- Sitting BP- Standing at 0 minutes Pulse- Standing at 0 minutes BP- Standing at 3 minutes Pulse- Standing at 3 minutes  01/24/24 1404 152/89 67 128/82 68 108/72 78 (!) 142/92 78     GEN: Well nourished, well developed, in no acute distress. Neck: No JVD or carotid bruits. Cardiac:  RRR.  No murmur. No rubs or gallops.   Respiratory:  Respirations regular and unlabored. Clear to  auscultation without rales, wheezing or rhonchi. GI: Soft, nontender, nondistended. Extremities: Radials/DP/PT 2+ and equal bilaterally. No clubbing or cyanosis. No edema   Skin: Warm and dry, no rash. Neuro: Strength intact.  Assessment & Plan   CAD S/p PCI with BMS x 2 to RCA January 2018, PCI with DES to distal RCA February 2022.  Patient denies chest pain, pressure or tightness. She has been working with PT up until 3 weeks ago with good tolerance.  -Continue aspirin , Plavix , rosuvastatin , Zetia , metoprolol , as needed SL NTG.   Chronic diastolic heart failure Echo June 2025 showed normal LV/RV function, mild LVH, Grade I DD, aortic valve sclerosis without stenosis.  Patient denies lower extremity edema, worsened shortness of breath, orthopnea or PND. She is not using supplemental O2 because it irritates her. Breath sounds diminished bilaterally otherwise euvolemic and well compensated on exam.  -Continue Farxiga , lisinopril , metoprolol .   Orthostatic hypotension BP today 158/92 on intake and 142/80 on my recheck. She has a significant drop in her BP going from laying down to sitting and sitting to standing with quick recovery to baseline. She describes dizziness as spinning sensation. She states it does not last long and as soon as it passes she can walk without difficulty. She was seen by PCP and asked to keep a BP log but she has not done so. I am hesitant to make adjustments to medications given her baseline hypertension and uncertainty what her BP is doing at home. She does admit to not hydrating well throughout the day. Discussed increasing hydration and trying compression socks. I have asked patient to check BP at least once a day a couple of hours after morning medications and follow up with PCP. In the meantime, I think it is okay for her to continue to work with PT. She can do seated exercises and they can make sure she is making slow position changes and being given a chance for  dizziness to pass before proceeding with standing exercises. Patient's daughter is in agreement.  - Increase hydration.  - Utilize compression socks.  - Keep BP log and follow up with PCP.   -Continue lisinopril , metoprolol .   Hyperlipidemia LDL 46 June 2025. -Continue rosuvastatin  and Zetia .   CVA/Left sided weakness/aphasia/memory loss 2018, 2021, January 2025.  Most recent CVA June 2025.  Loop recorder implantation June 2025. Last remote device check without sustained arrhythmias.  Patient reports continued weakness on left side. She is still having some aphasia. Patient's daughter is concerned she may have dementia. Neurology performed a memory test on patient but did not discuss next steps. Discussed possible referral to [facility]  for evaluation. Recommended daughter talk to neurology first to see if this is an appropriate time for referral or if she needs more time to recover from her stroke. Daughter agrees and will consult neurology for their opinion.  - Continue aspirin , Plavix , rosuvastatin , Zetia . - Continue to follow with neurology.  Tobacco abuse Patient reports smoking 1+ pack per day. Discussed slowly cutting back.  - Encouraged cessation.   Disposition: Follow up in 6 months or sooner as needed.          Signed, Barnie HERO. Antinio Sanderfer, DNP, NP-C

## 2024-01-23 ENCOUNTER — Ambulatory Visit: Admission: RE | Admit: 2024-01-23

## 2024-01-23 ENCOUNTER — Ambulatory Visit: Admitting: Occupational Therapy

## 2024-01-23 ENCOUNTER — Ambulatory Visit: Admitting: Physical Therapy

## 2024-01-23 ENCOUNTER — Ambulatory Visit

## 2024-01-23 DIAGNOSIS — I639 Cerebral infarction, unspecified: Secondary | ICD-10-CM | POA: Diagnosis not present

## 2024-01-24 ENCOUNTER — Ambulatory Visit: Attending: Student | Admitting: Student

## 2024-01-24 ENCOUNTER — Encounter: Payer: Self-pay | Admitting: Student

## 2024-01-24 ENCOUNTER — Ambulatory Visit: Admission: RE | Admit: 2024-01-24 | Discharge: 2024-01-24 | Attending: Acute Care | Admitting: Acute Care

## 2024-01-24 VITALS — BP 142/80 | HR 67 | Ht 66.0 in | Wt 161.0 lb

## 2024-01-24 DIAGNOSIS — Z720 Tobacco use: Secondary | ICD-10-CM | POA: Diagnosis not present

## 2024-01-24 DIAGNOSIS — I5032 Chronic diastolic (congestive) heart failure: Secondary | ICD-10-CM | POA: Insufficient documentation

## 2024-01-24 DIAGNOSIS — R42 Dizziness and giddiness: Secondary | ICD-10-CM | POA: Diagnosis not present

## 2024-01-24 DIAGNOSIS — Z87891 Personal history of nicotine dependence: Secondary | ICD-10-CM | POA: Diagnosis not present

## 2024-01-24 DIAGNOSIS — Z8673 Personal history of transient ischemic attack (TIA), and cerebral infarction without residual deficits: Secondary | ICD-10-CM | POA: Diagnosis not present

## 2024-01-24 DIAGNOSIS — R531 Weakness: Secondary | ICD-10-CM | POA: Insufficient documentation

## 2024-01-24 DIAGNOSIS — I2510 Atherosclerotic heart disease of native coronary artery without angina pectoris: Secondary | ICD-10-CM | POA: Diagnosis not present

## 2024-01-24 DIAGNOSIS — R4701 Aphasia: Secondary | ICD-10-CM | POA: Insufficient documentation

## 2024-01-24 DIAGNOSIS — I10 Essential (primary) hypertension: Secondary | ICD-10-CM | POA: Insufficient documentation

## 2024-01-24 DIAGNOSIS — E785 Hyperlipidemia, unspecified: Secondary | ICD-10-CM | POA: Insufficient documentation

## 2024-01-24 DIAGNOSIS — R911 Solitary pulmonary nodule: Secondary | ICD-10-CM | POA: Diagnosis not present

## 2024-01-24 DIAGNOSIS — I951 Orthostatic hypotension: Secondary | ICD-10-CM | POA: Insufficient documentation

## 2024-01-24 LAB — CUP PACEART REMOTE DEVICE CHECK
Date Time Interrogation Session: 20251201233319
Implantable Pulse Generator Implant Date: 20250626

## 2024-01-24 NOTE — Patient Instructions (Signed)
 Medication Instructions:   Your physician recommends that you continue on your current medications as directed. Please refer to the Current Medication list given to you today.    *If you need a refill on your cardiac medications before your next appointment, please call your pharmacy*  Lab Work:  None ordered at this time   If you have labs (blood work) drawn today and your tests are completely normal, you will receive your results only by:  MyChart Message (if you have MyChart) OR  A paper copy in the mail If you have any lab test that is abnormal or we need to change your treatment, we will call you to review the results.  Testing/Procedures:  None ordered at this time   Referrals:  None ordered at this time   Follow-Up:  At [facility], you and your health needs are our priority.  As part of our continuing mission to provide you with exceptional heart care, our providers are all part of one team.  This team includes your primary Cardiologist (physician) and Advanced Practice Providers or APPs (Physician Assistants and Nurse Practitioners) who all work together to provide you with the care you need, when you need it.  Your next appointment:   5 - 6 month(s)  Provider:    You may see Timothy Gollan, MD or one of the following Advanced Practice Providers on your designated Care Team:   Lonni Meager, NP Lesley Maffucci, PA-C Bernardino Bring, PA-C Cadence Oakhaven, PA-C Tylene Lunch, NP Barnie Hila, NP    We recommend signing up for the patient portal called MyChart.  Sign up information is provided on this After Visit Summary.  MyChart is used to connect with patients for Virtual Visits (Telemedicine).  Patients are able to view lab/test results, encounter notes, upcoming appointments, etc.  Non-urgent messages can be sent to your provider as well.   To learn more about what you can do with MyChart, go to [email or web address].   Other Instructions  Ask your  Neurologist if the following would be an appropriate for you to seek treatment.  [facility]  Psychologist in [place], [place]   Address:  [address], [place], [place] [address]  Phone:  [phone]

## 2024-01-25 ENCOUNTER — Ambulatory Visit: Attending: Family Medicine

## 2024-01-25 ENCOUNTER — Ambulatory Visit

## 2024-01-25 ENCOUNTER — Encounter

## 2024-01-25 DIAGNOSIS — R262 Difficulty in walking, not elsewhere classified: Secondary | ICD-10-CM | POA: Insufficient documentation

## 2024-01-25 DIAGNOSIS — R2689 Other abnormalities of gait and mobility: Secondary | ICD-10-CM | POA: Diagnosis present

## 2024-01-25 DIAGNOSIS — R278 Other lack of coordination: Secondary | ICD-10-CM

## 2024-01-25 DIAGNOSIS — R4701 Aphasia: Secondary | ICD-10-CM | POA: Diagnosis present

## 2024-01-25 DIAGNOSIS — R482 Apraxia: Secondary | ICD-10-CM | POA: Insufficient documentation

## 2024-01-25 DIAGNOSIS — R269 Unspecified abnormalities of gait and mobility: Secondary | ICD-10-CM | POA: Insufficient documentation

## 2024-01-25 DIAGNOSIS — M6281 Muscle weakness (generalized): Secondary | ICD-10-CM | POA: Diagnosis present

## 2024-01-25 DIAGNOSIS — R42 Dizziness and giddiness: Secondary | ICD-10-CM | POA: Insufficient documentation

## 2024-01-25 DIAGNOSIS — R2681 Unsteadiness on feet: Secondary | ICD-10-CM | POA: Insufficient documentation

## 2024-01-25 NOTE — Therapy (Signed)
 OUTPATIENT SPEECH LANGUAGE PATHOLOGY APHASIA TREATMENT   Patient Name: Hannah Kirby MRN: [id number] DOB:[birth year], 78 y.o., female Today's Date: 01/25/2024  PCP: [name] Perry, MD REFERRING PROVIDER: [name] Perry, MD   End of Session - 01/25/24 1408     Visit Number 17    Number of Visits 31    Date for Recertification  02/08/24    Progress Note Due on Visit 20    SLP Start Time 1445    SLP Stop Time  1530    SLP Time Calculation (min) 45 min    Activity Tolerance Patient tolerated treatment well          Past Medical History:  Diagnosis Date   Acute hemorrhagic colitis 08/08/2019   Anemia    C. difficile colitis 08/09/2019   CAD (coronary artery disease)    a. 02/2006 PCI: BMS x 2 to RCA, cath o/w without significant coronary disease; b. nuclear stress test 07/2014: No ischemia/infarct; c. 11/2017 MV: no isch/infarct, EF 55-65%; d. 03/2020 NSTEMI/PCI: LM nl, LAD min irregs, RI 25, small, LCX nl, RCA 30p/m ISR, 99d (3.0x15 Resolute Onyx DES).   Cataract 02/04/2018   Cerebrovascular accident (CVA) (HCC) 03/18/2023   dysarthria   Chronic bronchitis (HCC)    secondary to cigarette smoking   FHx: allergies    Goiter    Granulomatous disease    Hernia    Kidney stone on left side 2013   NSTEMI (non-ST elevated myocardial infarction) (HCC) 03/23/2020   Oxygen  deficiency    Panic attacks    PVC's (premature ventricular contractions)    a. 03/2018 Zio: Occas PVCs (2.5%). Triggered events assoc w/ PVC/PAC.   Retinal detachment, left 03/04/2020   Retinal tear 2020   Stroke (HCC) 10/29/2016   mild left side weakness   Stroke (HCC) 08/01/2019   Tobacco abuse    Past Surgical History:  Procedure Laterality Date   ABDOMINAL HYSTERECTOMY     BREAST SURGERY     CATARACT EXTRACTION W/PHACO Right 03/01/2017   Procedure: CATARACT EXTRACTION PHACO AND INTRAOCULAR LENS PLACEMENT (IOC) RIGHT DIABETIC;  Surgeon: Mittie Gaskin, MD;  Location: [facility];  Service:  Ophthalmology;  Laterality: Right;   CATARACT EXTRACTION W/PHACO Left 03/22/2017   Procedure: CATARACT EXTRACTION PHACO AND INTRAOCULAR LENS PLACEMENT (IOC) LEFT DIABETIC;  Surgeon: Mittie Gaskin, MD;  Location: [facility];  Service: Ophthalmology;  Laterality: Left;  Diabetic - insulin  and oral meds   COLONOSCOPY WITH PROPOFOL  N/A 09/23/2014   Procedure: COLONOSCOPY WITH PROPOFOL ;  Surgeon: Gladis RAYMOND Mariner, MD;  Location: [facility] ENDOSCOPY;  Service: Endoscopy;  Laterality: N/A;   COLONOSCOPY WITH PROPOFOL  N/A 01/11/2018   Procedure: COLONOSCOPY WITH PROPOFOL ;  Surgeon: Mariner Gladis RAYMOND, MD;  Location: [facility] ENDOSCOPY;  Service: Endoscopy;  Laterality: N/A;   COLONOSCOPY WITH PROPOFOL  N/A 04/24/2018   Procedure: COLONOSCOPY WITH PROPOFOL ;  Surgeon: Mariner Gladis RAYMOND, MD;  Location: [facility] ENDOSCOPY;  Service: Endoscopy;  Laterality: N/A;   COLONOSCOPY WITH PROPOFOL  N/A 11/19/2019   Procedure: COLONOSCOPY WITH PROPOFOL ;  Surgeon: Jinny Carmine, MD;  Location: [facility] ENDOSCOPY;  Service: Endoscopy;  Laterality: N/A;   CORONARY ANGIOPLASTY WITH STENT PLACEMENT  2008   CORONARY STENT INTERVENTION N/A 03/24/2020   Procedure: CORONARY STENT INTERVENTION;  Surgeon: Mady Bruckner, MD;  Location: [facility] INVASIVE CV LAB;  Service: Cardiovascular;  Laterality: N/A;   ESOPHAGOGASTRODUODENOSCOPY (EGD) WITH PROPOFOL  N/A 12/30/2014   Procedure: ESOPHAGOGASTRODUODENOSCOPY (EGD) WITH PROPOFOL ;  Surgeon: Gladis RAYMOND Mariner, MD;  Location: [facility] ENDOSCOPY;  Service: Endoscopy;  Laterality: N/A;   ESOPHAGOGASTRODUODENOSCOPY (EGD) WITH PROPOFOL  N/A 07/19/2016   Procedure: ESOPHAGOGASTRODUODENOSCOPY (EGD) WITH PROPOFOL ;  Surgeon: Gaylyn Gladis PENNER, MD;  Location: [facility] ENDOSCOPY;  Service: Endoscopy;  Laterality: N/A;   ESOPHAGOGASTRODUODENOSCOPY (EGD) WITH PROPOFOL  N/A 04/24/2018   Procedure: ESOPHAGOGASTRODUODENOSCOPY (EGD) WITH PROPOFOL ;  Surgeon: Gaylyn Gladis PENNER, MD;  Location: [facility] ENDOSCOPY;   Service: Endoscopy;  Laterality: N/A;   EYE SURGERY  cataracts, detached retina   hysterectomy (other)     LEFT HEART CATH AND CORONARY ANGIOGRAPHY N/A 03/24/2020   Procedure: LEFT HEART CATH AND CORONARY ANGIOGRAPHY;  Surgeon: Mady Bruckner, MD;  Location: [facility] INVASIVE CV LAB;  Service: Cardiovascular;  Laterality: N/A;   Patient Active Problem List   Diagnosis Date Noted   Stroke (HCC) 08/05/2023   Depression with anxiety 08/05/2023   Overweight (BMI 25.0-29.9) 08/05/2023   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) 03/17/2023   Dizziness 02/23/2023   Restrictive airway disease 07/21/2020   Partial thickness rotator cuff tear 03/20/2020   Shoulder pain 03/05/2020   Ataxia 02/28/2020   Difficulty walking 02/28/2020   Physical deconditioning 02/28/2020   Chronic diastolic congestive heart failure (HCC) 11/20/2019   Polyp of ascending colon    Hypokalemia 08/08/2019   TIA (transient ischemic attack) 08/01/2019   Benign hypertensive kidney disease with chronic kidney disease 04/25/2019   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/24/2018   Chronic, continuous use of opioids 03/26/2018   History of adenomatous polyp of colon 01/22/2018   Diverticulosis of colon 01/22/2018   History of CVA (cerebrovascular accident) 11/08/2016   Weakness of left upper extremity 10/30/2016   AAA (abdominal aortic aneurysm) without rupture 08/12/2016   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   Stage 3b chronic kidney disease (HCC) 07/27/2015   Type 2 diabetes mellitus with diabetic chronic kidney disease (HCC) 10/28/2014   Solitary pulmonary nodule on lung CT 08/20/2014   Allergic rhinitis 07/28/2014   Acid reflux 07/28/2014   Chronic obstructive pulmonary disease (HCC) 07/28/2014   Granuloma annulare    Back pain 11/12/2013   Lumbar scoliosis 10/30/2013   Lumbar radiculopathy 10/30/2013   Lumbar canal stenosis 10/30/2013   Proteinuria 10/22/2013   Smokes with greater  than 30 pack year history 03/21/2011   Edema 08/18/2010   Hyperlipidemia 03/25/2009   Coronary artery disease 03/25/2009   Primary hypertension 03/25/2009    ONSET DATE: 03/17/23 (admitted with stroke), 05/23/23 (referral date)  REFERRING DIAG:  R41.841 (ICD-10-CM) - Cognitive communication deficit  I63.9 (ICD-10-CM) - CVA (cerebral vascular accident) (HCC)  R47.02 (ICD-10-CM) - Dysphasia    THERAPY DIAG:  Aphasia  Apraxia of speech  Rationale for Evaluation and Treatment Rehabilitation  SUBJECTIVE:   SUBJECTIVE STATEMENT: Pt alert, pleasant, and cooperative.  Pt accompanied by: self and family member  PERTINENT HISTORY:  Pt is a 78 y.o. female who presents for communication evaluation in setting on stroke. Pt in ED 1/24-1/25/25. Pt d/c'd home with HH ST. PMHx significant of   HTN, CKD stage IIIB, DMT2 on insulin , CAD, chronic smoking, chronic respiratory failure with COPD and on home oxygen , anxiety, HLD. MRI 03/17/23 1. Punctate foci of restricted diffusion in the left posterior  frontal white matter, most likely acute infarcts.  2. Numerous foci of hemosiderin deposition, which are seen in the  deep gray structures but also in the bilateral cerebral hemispheres,  with 1 new focus compared to 02/20/2023. While this could be the  sequela of chronic hypertensive microhemorrhages, this appearance is  concerning for cerebral amyloid angiopathy.  PAIN:  Are you having pain? No  FALLS: defer to PT  LIVING ENVIRONMENT: Lives with: lives with their spouse Lives in: House/apartment  PLOF:  Level of assistance: Independent with ADLs Employment: Retired   PATIENT GOALS    for communication to improve   OBJECTIVE:  TODAY'S TREATMENT: Education provided re: importance of cognitive, physical, and social engagement. Handout provided with suggestions for each. Reviewed role of salience in neuroplasticity.   Pt repeated 4 word phrases with ~20% indep, improving to 90%  with min-max verbal/visual cues. Pt generated short sentences for pictured scenes from favorite TV show with mod/max cueing.       PATIENT EDUCATION: Education details: as above Person educated: Patient and Child Education method: Explanation Education comprehension: verbalized understanding and needs further education  HOME EXERCISE PROGRAM:    Use SGD for HEP  Talk at home!      GOALS:  Goals reviewed with patient? Yes  SHORT TERM GOALS: Target date: 10 sessions  Pt will participate in further assessment of functional reading/writing. Baseline: Goal status: MET  2.  With Moderate A, patient will complete a semantic feature analysis with at least 2 relevant features for 8/10 target words to improve word-finding skills.  Baseline:  Goal status: PROGRESSING; continue goal on 01/11/24  3.  With Maximal A, patient will generate sentences with 3 or more words in response to a situation at 80% accuracy in order to increase ability to communicate basic wants and needs.  Baseline:  Goal status: PROGRESSING; continue goal on 01/11/24  4.   With Maximal A,  pt will follow 2-step commands for improved participation in ADLs/iADLs with max cueing. Baseline:  Goal status: MET  5.  Pt will repeat short sentences (<4 words) with good approximations 80% of the time with max cueing. Baseline:  Goal status: MET     LONG TERM GOALS: Target date: 4 weeks; 02/08/24  Pt will report a subjective improvement in communication per PROM.  Baseline: CES 12/32 on 8/26 Goal status: INITIAL; continue goal  2.  With Min A, patient/family will demonstrate understanding of the following concepts: aphasia, spontaneous recovery, communication vs conversation, strengths/strategies to promote success, local resources in order to increase patient's participation in medical care.    Baseline:  Goal status: MET  3. Pt and/or daughter will demonstrate understanding of multiple ways to stimulate pt  cognitively outside of ST.   Baseline:  Goal status: INITIAL   ASSESSMENT:  CLINICAL IMPRESSION: Pt is a 78 y.o. female who presents for communication treatment in setting on stroke. Pt known to SLP services and was receiving OP ST prior to most recent stroke 08/05/23. Pt d/c'd home with [facility] ST who is working on securing SGD for pt. PMHx significant of  HTN, CKD stage IIIB, DMT2 on insulin , CAD, chronic smoking, chronic respiratory failure with COPD and on home oxygen , anxiety, HLD. Re-assessment completed 01/10/34 via formal assessment Western Aphasia Battery Revised (WAB-R). 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 Noted improvement in pt's auditory comprehension and repetition; however, difficulty with spontaneous speech and naming/wordfinding persist. Course of therapy, thus far this episode, has be limited by pt's illness with patient missing 8 of 23 scheduled sessions and pt's limited support/cognitive stimulationat home. Recommend continued course of skilled ST on a trial basis for the next 4 weeks with pt in agreement to complete home assignments and attend therapy sessions regularly. See details of  tx session above. Recommend course of ST targeting functional communication, further assessment of functional reading/writing, and pt/caregiver training to help promote QoL and overall life participation.   OBJECTIVE IMPAIRMENTS include expressive language, receptive language, and aphasia. These impairments are limiting patient from ADLs/IADLs and effectively communicating at home and in community. Factors affecting potential to achieve goals and functional outcome are severity of impairments. Patient will benefit from skilled SLP services to address above impairments and improve overall function.  REHAB POTENTIAL: Good  PLAN: SLP FREQUENCY: 2x/week  SLP DURATION: 4 weeks  PLANNED  INTERVENTIONS: Language facilitation, Cueing hierachy, Internal/external aids, Functional tasks, Multimodal communication approach, SLP instruction and feedback, Compensatory strategies, and Patient/family education    Delon Bangs, M.S., CCC-SLP Speech-Language Pathologist [facility] [phone] [place])  [facility] [address] [place], [place], [address] Phone: [phone]   Fax:  [phone]

## 2024-01-25 NOTE — Therapy (Signed)
 OUTPATIENT PHYSICAL THERAPY NEURO TREATMENT   Patient Name: Hannah Kirby MRN: [id number] DOB:12/18/1945, 78 y.o., female Today's Date: 01/26/2024   PCP: Gasper [name] BRAVO, MD REFERRING PROVIDER: Gasper [name] BRAVO, MD  END OF SESSION:    PT End of Session - 01/25/24 1405     Visit Number 15    Number of Visits 39    Date for Recertification  04/18/24    PT Start Time 1403    PT Stop Time 1445    PT Time Calculation (min) 42 min    Equipment Utilized During Treatment Gait belt    Activity Tolerance Patient tolerated treatment well    Behavior During Therapy [facility] for tasks assessed/performed          Past Medical History:  Diagnosis Date   Acute hemorrhagic colitis 08/08/2019   Anemia    C. difficile colitis 08/09/2019   CAD (coronary artery disease)    a. 02/2006 PCI: BMS x 2 to RCA, cath o/w without significant coronary disease; b. nuclear stress test 07/2014: No ischemia/infarct; c. 11/2017 MV: no isch/infarct, EF 55-65%; d. 03/2020 NSTEMI/PCI: LM nl, LAD min irregs, RI 25, small, LCX nl, RCA 30p/m ISR, 99d (3.0x15 Resolute Onyx DES).   Cataract 02/04/2018   Cerebrovascular accident (CVA) (HCC) 03/18/2023   dysarthria   Chronic bronchitis (HCC)    secondary to cigarette smoking   FHx: allergies    Goiter    Granulomatous disease    Hernia    Kidney stone on left side 2013   NSTEMI (non-ST elevated myocardial infarction) (HCC) 03/23/2020   Oxygen  deficiency    Panic attacks    PVC's (premature ventricular contractions)    a. 03/2018 Zio: Occas PVCs (2.5%). Triggered events assoc w/ PVC/PAC.   Retinal detachment, left 03/04/2020   Retinal tear 2020   Stroke (HCC) 10/29/2016   mild left side weakness   Stroke (HCC) 08/01/2019   Tobacco abuse    Past Surgical History:  Procedure Laterality Date   ABDOMINAL HYSTERECTOMY     BREAST SURGERY     CATARACT EXTRACTION W/PHACO Right 03/01/2017   Procedure: CATARACT EXTRACTION PHACO AND INTRAOCULAR LENS PLACEMENT (IOC)  RIGHT DIABETIC;  Surgeon: Mittie Gaskin, MD;  Location: [facility];  Service: Ophthalmology;  Laterality: Right;   CATARACT EXTRACTION W/PHACO Left 03/22/2017   Procedure: CATARACT EXTRACTION PHACO AND INTRAOCULAR LENS PLACEMENT (IOC) LEFT DIABETIC;  Surgeon: Mittie Gaskin, MD;  Location: [facility];  Service: Ophthalmology;  Laterality: Left;  Diabetic - insulin  and oral meds   COLONOSCOPY WITH PROPOFOL  N/A 09/23/2014   Procedure: COLONOSCOPY WITH PROPOFOL ;  Surgeon: Gladis RAYMOND Mariner, MD;  Location: [facility] ENDOSCOPY;  Service: Endoscopy;  Laterality: N/A;   COLONOSCOPY WITH PROPOFOL  N/A 01/11/2018   Procedure: COLONOSCOPY WITH PROPOFOL ;  Surgeon: Mariner Gladis RAYMOND, MD;  Location: [facility] ENDOSCOPY;  Service: Endoscopy;  Laterality: N/A;   COLONOSCOPY WITH PROPOFOL  N/A 04/24/2018   Procedure: COLONOSCOPY WITH PROPOFOL ;  Surgeon: Mariner Gladis RAYMOND, MD;  Location: [facility] ENDOSCOPY;  Service: Endoscopy;  Laterality: N/A;   COLONOSCOPY WITH PROPOFOL  N/A 11/19/2019   Procedure: COLONOSCOPY WITH PROPOFOL ;  Surgeon: Jinny Carmine, MD;  Location: [facility] ENDOSCOPY;  Service: Endoscopy;  Laterality: N/A;   CORONARY ANGIOPLASTY WITH STENT PLACEMENT  2008   CORONARY STENT INTERVENTION N/A 03/24/2020   Procedure: CORONARY STENT INTERVENTION;  Surgeon: Mady Bruckner, MD;  Location: [facility] INVASIVE CV LAB;  Service: Cardiovascular;  Laterality: N/A;   ESOPHAGOGASTRODUODENOSCOPY (EGD) WITH PROPOFOL  N/A 12/30/2014   Procedure: ESOPHAGOGASTRODUODENOSCOPY (  EGD) WITH PROPOFOL ;  Surgeon: Gladis RAYMOND Mariner, MD;  Location: [facility] ENDOSCOPY;  Service: Endoscopy;  Laterality: N/A;   ESOPHAGOGASTRODUODENOSCOPY (EGD) WITH PROPOFOL  N/A 07/19/2016   Procedure: ESOPHAGOGASTRODUODENOSCOPY (EGD) WITH PROPOFOL ;  Surgeon: Mariner Gladis RAYMOND, MD;  Location: [facility] ENDOSCOPY;  Service: Endoscopy;  Laterality: N/A;   ESOPHAGOGASTRODUODENOSCOPY (EGD) WITH PROPOFOL  N/A 04/24/2018   Procedure:  ESOPHAGOGASTRODUODENOSCOPY (EGD) WITH PROPOFOL ;  Surgeon: Mariner Gladis RAYMOND, MD;  Location: [facility] ENDOSCOPY;  Service: Endoscopy;  Laterality: N/A;   EYE SURGERY  cataracts, detached retina   hysterectomy (other)     LEFT HEART CATH AND CORONARY ANGIOGRAPHY N/A 03/24/2020   Procedure: LEFT HEART CATH AND CORONARY ANGIOGRAPHY;  Surgeon: Mady Bruckner, MD;  Location: [facility] INVASIVE CV LAB;  Service: Cardiovascular;  Laterality: N/A;   Patient Active Problem List   Diagnosis Date Noted   Stroke (HCC) 08/05/2023   Depression with anxiety 08/05/2023   Overweight (BMI 25.0-29.9) 08/05/2023   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) 03/17/2023   Dizziness 02/23/2023   Restrictive airway disease 07/21/2020   Partial thickness rotator cuff tear 03/20/2020   Shoulder pain 03/05/2020   Ataxia 02/28/2020   Difficulty walking 02/28/2020   Physical deconditioning 02/28/2020   Chronic diastolic congestive heart failure (HCC) 11/20/2019   Polyp of ascending colon    Hypokalemia 08/08/2019   TIA (transient ischemic attack) 08/01/2019   Benign hypertensive kidney disease with chronic kidney disease 04/25/2019   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/24/2018   Chronic, continuous use of opioids 03/26/2018   History of adenomatous polyp of colon 01/22/2018   Diverticulosis of colon 01/22/2018   History of CVA (cerebrovascular accident) 11/08/2016   Weakness of left upper extremity 10/30/2016   AAA (abdominal aortic aneurysm) without rupture 08/12/2016   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   Stage 3b chronic kidney disease (HCC) 07/27/2015   Type 2 diabetes mellitus with diabetic chronic kidney disease (HCC) 10/28/2014   Solitary pulmonary nodule on lung CT 08/20/2014   Allergic rhinitis 07/28/2014   Acid reflux 07/28/2014   Chronic obstructive pulmonary disease (HCC) 07/28/2014   Granuloma annulare    Back pain 11/12/2013   Lumbar scoliosis 10/30/2013   Lumbar  radiculopathy 10/30/2013   Lumbar canal stenosis 10/30/2013   Proteinuria 10/22/2013   Smokes with greater than 30 pack year history 03/21/2011   Edema 08/18/2010   Hyperlipidemia 03/25/2009   Coronary artery disease 03/25/2009   Primary hypertension 03/25/2009    ONSET DATE: 08/05/23  REFERRING DIAG:  Z86.73 (ICD-10-CM) - History of CVA (cerebrovascular accident)  R27.0 (ICD-10-CM) - Ataxia    THERAPY DIAG:   Other lack of coordination  Muscle weakness (generalized)  Unsteadiness on feet  Abnormality of gait and mobility  Other abnormalities of gait and mobility  Dizziness and giddiness  Difficulty in walking, not elsewhere classified  Rationale for Evaluation and Treatment: Rehabilitation  SUBJECTIVE:  SUBJECTIVE STATEMENT:  Pt is accompanied by daughter.  Pt was cleared by cardiovascular MD to continue with therapy, but advised the therapist to allow for blood pressure to normalize in standing before getting the pt up to walk.     Pt accompanied by: self and family member, daughter  PERTINENT HISTORY:  PMH: of CVA, COPD, Dysarthria, Ataxia, TIA, T2DM, Back pain, scoliosis   PAIN:  Are you having pain? Yes: NPRS scale: 3/10 Pain location: low back and L hip Pain description: achy Aggravating factors: just hurts all the time. Relieving factors: hydrocodone  at night  PRECAUTIONS: Other: Pt has a looper to see if pt has a-fib  RED FLAGS: Abdominal aneurysm: Yes: pt reports having an abdominal aneurism as found by Dr. Marea.   WEIGHT BEARING RESTRICTIONS: No  FALLS: Has patient fallen in last 6 months? Yes. Number of falls 1.  Pt reports losing balance, and got caught up in cords that were on the floor when she was trying to close the blinds.  LIVING ENVIRONMENT: Lives with:  lives with their spouse Lives in: House/apartment Stairs: Yes: External: 1 steps; none Has following equipment at home: Single point cane, Walker - 2 wheeled, Environmental Consultant - 4 wheeled, shower chair, and Grab bars  PLOF: Independent  PATIENT GOALS: Pt is wanting to gain independence.  Pt is wanting to improve endurance levels and improving speech with SLP.  OBJECTIVE:  Note: Objective measures were completed at Evaluation unless otherwise noted.  DIAGNOSTIC FINDINGS:   EXAM: MRI HEAD WITHOUT CONTRAST   MRA HEAD WITHOUT CONTRAST   MRA NECK WITHOUT AND WITH CONTRAST   IMPRESSION: 1. Small acute infarcts in the left frontal lobe, right parietal lobe, and left cerebellum. 2. Extensive chronic small vessel ischemic disease. 3. 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 4. Unchanged severe right A3 stenosis or segmental occlusion. 5. Moderate left P2 stenosis. 6. Limited assessment of the left vertebral artery origin, otherwise negative neck MRA.   COGNITION: Overall cognitive status: Within functional limits for tasks assessed   SENSATION: WFL, however pt notes having some numbness in the R hand.  COORDINATION: WFL  POSTURE: rounded shoulders, forward head, decreased lumbar lordosis, increased thoracic kyphosis, posterior pelvic tilt, and flexed trunk    LOWER EXTREMITY MMT:    MMT Right Eval Left Eval  Hip flexion 4 4  Hip extension    Hip abduction 4 4-  Hip adduction 4 4-  Hip internal rotation    Hip external rotation    Knee flexion 4 4-  Knee extension 4 4-  Ankle dorsiflexion 4 4-  Ankle plantarflexion    Ankle inversion    Ankle eversion    (Blank rows = not tested)  BED MOBILITY:  Not tested  FUNCTIONAL TESTS:  5 times sit to stand: 24.36 sec Timed up and go (TUG): 23.94 sec with walker 2 minute walk test: TBD 10 meter walk test: 20.33 sec with walker Berg Balance Scale: TBD  PATIENT SURVEYS:   Stroke Impact Scale 16 (Copyrighted  instrument, [facility])  In the past 2 weeks, how difficult was it to...  Rating Scale 5 = Not difficult at all 4 = A little difficult 3 = Somewhat difficult 2 = Very difficult 1 = Could not do at all  a. Dress the top part of your body? 4  b. Bathe yourself? 2  c. Get to the toilet on time? 5  d. Control your bladder (not have an accident)?  5  e. Control your bowels (not have an accident)? 5  f. Stand without losing balance? 3  g. Go shopping? 2  h. Do heavy household chores (e.g. vacuum, laundry or  yard work)? 1  i. Stay sitting without losing your balance? 3  j. Walk without losing your balance? 2  k. Move from a bed to a chair? 3  l. Walk fast? 1  m. Climb one flight of stairs? 1  n. Walk one block? 1  o. Get in and out of a car? 2  p. Carry heavy objects (e.g. bag of groceries) with your  affected hand? 1  Sum:  41/80  MDC (Minimal Detectable Change) is >/=8                                                                                                                                 TREATMENT DATE: 01/26/24   Pt arrived to session in transport chair, daughter present.   SpO2: 90-92% upon arrival to the clinic with room air; seated.   BP= 151/84 mmHg  Left Sitting; HR: 73  BP= 125/74 mmHg Left Standing; HR: 81  Ambulation with CGA, no AD around the gym with focus on purse lip breathing throughout, 2 total laps (300')  BP monitored following the ambulation attempt:  BP= 145/84 mmHg  Left Sitting; HR: 75  Ambulation with CGA, with use of rollator around the gym with focus on purse lip breathing throughout, 2 total laps (300')  BP monitored following the ambulation attempt:  BP= 151/77 mmHg  Left Sitting; HR: 74   SpO2 remained >90% throughout the entirety of the session     PATIENT EDUCATION: Education details: Pt educated on role of PT and services provided during current POC, along with prognosis and information about the  clinic.  Person educated: Patient and Child(ren) Education method: Explanation Education comprehension: verbalized understanding and returned demonstration  HOME EXERCISE PROGRAM:  Access Code: [email or web address] URL: [email or web address] Date: 10/17/2023 Prepared by: Sidra Simpers  Exercises - Standing March with Counter Support  - 1 x daily - 3-4 x weekly - 3 sets - 10 reps - Standing Knee Flexion with Unilateral Counter Support  - 1 x daily - 7 x weekly - 3 sets - 10 reps - Standing Hip Extension with Unilateral Counter Support  - 1 x daily - 7 x weekly - 3 sets - 10 reps - Standing Hip Abduction with Unilateral Counter Support  - 1 x daily - 7 x weekly - 3 sets - 10 reps - Heel Toe Raises with Unilateral Counter Support  - 1 x daily - 7 x weekly - 3 sets - 10 reps  GOALS: Goals reviewed with patient? Yes  SHORT TERM GOALS: Target date: 11/15/2023  Pt will be independent with HEP in order to demonstrate increased ability to perform tasks related to occupation/hobbies. Baseline: pt given HEP at eval Goal status: INITIAL  LONG TERM GOALS: Target  date: 01/10/2024  1.  Patient (> 56 years old) will complete five times sit to stand test in < 15 seconds indicating an increased LE strength and improved balance. Baseline: 24.36 sec Goal status: INITIAL  2.  Patient will improve SIS 16 score to 55   to demonstrate statistically significant improvement in mobility and quality of life as it relates to their CVA.  Baseline: 41 Goal status: INITIAL   3.  Patient will increase Berg Balance score by > 6 points to demonstrate decreased fall risk during functional activities. Baseline: 33/56 Goal status: INITIAL   4.  Patient will reduce timed up and go to <11 seconds to reduce fall risk and demonstrate improved transfer/gait ability. Baseline: 23.94 sec with walker Goal status: INITIAL  5.  Patient will increase 10 meter walk test to >1.84m/s as to improve gait speed for better  community ambulation and to reduce fall risk. Baseline: 20.33 sec with walker Goal status: INITIAL  6.  Patient will increase 2 minute walk test distance by 50 ft or greater for progression to community ambulator and demonstrate improved gait ability  Baseline: 275'; 83.82 m Goal status: INITIAL     ASSESSMENT:  CLINICAL IMPRESSION:  Pt was able to ambulate well without the need of the AD on the initial attempt, however pt reportedly more taxed and requested to perform the ambulation attempts at the end of the session with use of the rollator.  Pt's vitals monitored carefully throughout the session and noted above.  Pt still maintained appropriate symptom response to exercises and good vitals.  Will continue to monitor throughout future sessions as well.   Pt will continue to benefit from skilled therapy to address remaining deficits in order to improve overall QoL and return to PLOF.       OBJECTIVE IMPAIRMENTS: Abnormal gait, decreased activity tolerance, decreased balance, decreased endurance, decreased knowledge of use of DME, decreased mobility, difficulty walking, decreased strength, impaired sensation, and pain.   ACTIVITY LIMITATIONS: carrying, lifting, bending, standing, squatting, stairs, transfers, bathing, toileting, dressing, hygiene/grooming, and locomotion level  PARTICIPATION LIMITATIONS: meal prep, cleaning, laundry, community activity, and yard work  PERSONAL FACTORS: Age and 3+ comorbidities:  PMH: of CVA, COPD, Dysarthria, Ataxia, TIA, T2DM, Back pain, scoliosis are also affecting patient's functional outcome.   REHAB POTENTIAL: Good  CLINICAL DECISION MAKING: Evolving/moderate complexity  EVALUATION COMPLEXITY: Moderate  PLAN:  PT FREQUENCY: 2x/week  PT DURATION: 12 weeks  PLANNED INTERVENTIONS: 97750- Physical Performance Testing, 97110-Therapeutic exercises, 97530- Therapeutic activity, 97112- Neuromuscular re-education, 97535- Self Care, [id number]- Manual  therapy, [phone]- Gait training, Balance training, and Vestibular training  PLAN FOR NEXT SESSION:    Monitor SPO2 and get portable oxygen  if necessary  Endurance, balance, strength ( L>R), updated HEP, gait training  -Limiting seated rest breaks as tolerated to challenge endurance   Fonda Simpers, PT, DPT Physical Therapist - [facility]  01/26/24, 5:11 PM

## 2024-01-25 NOTE — Therapy (Signed)
 OUTPATIENT OCCUPATIONAL THERAPY NEURO RECERTIFICATION AND TREATMENT NOTE  Patient Name: Hannah Kirby MRN: [id number] DOB:04/23/1945, 78 y.o., female Today's Date: 01/28/2024  PCP: Dr. [name] Perry REFERRING PROVIDER: Dr. [name] Perry  END OF SESSION:   OT End of Session - 01/11/24 1431       Visit Number 12     Number of Visits 24     Date for Recertification  04/18/24    OT Start Time 1530    OT Stop Time 1615    OT Time Calculation (min) 45 min     Equipment Utilized During Treatment transport chair     Activity Tolerance Patient tolerated treatment well     Behavior During Therapy WFL for tasks assessed/performed           Past Medical History:  Diagnosis Date   Acute hemorrhagic colitis 08/08/2019   Anemia    C. difficile colitis 08/09/2019   CAD (coronary artery disease)    a. 02/2006 PCI: BMS x 2 to RCA, cath o/w without significant coronary disease; b. nuclear stress test 07/2014: No ischemia/infarct; c. 11/2017 MV: no isch/infarct, EF 55-65%; d. 03/2020 NSTEMI/PCI: LM nl, LAD min irregs, RI 25, small, LCX nl, RCA 30p/m ISR, 99d (3.0x15 Resolute Onyx DES).   Cataract 02/04/2018   Cerebrovascular accident (CVA) (HCC) 03/18/2023   dysarthria   Chronic bronchitis (HCC)    secondary to cigarette smoking   FHx: allergies    Goiter    Granulomatous disease    Hernia    Kidney stone on left side 2013   NSTEMI (non-ST elevated myocardial infarction) (HCC) 03/23/2020   Oxygen  deficiency    Panic attacks    PVC's (premature ventricular contractions)    a. 03/2018 Zio: Occas PVCs (2.5%). Triggered events assoc w/ PVC/PAC.   Retinal detachment, left 03/04/2020   Retinal tear 2020   Stroke (HCC) 10/29/2016   mild left side weakness   Stroke (HCC) 08/01/2019   Tobacco abuse    Past Surgical History:  Procedure Laterality Date   ABDOMINAL HYSTERECTOMY     BREAST SURGERY     CATARACT EXTRACTION W/PHACO Right 03/01/2017   Procedure: CATARACT EXTRACTION PHACO AND  INTRAOCULAR LENS PLACEMENT (IOC) RIGHT DIABETIC;  Surgeon: Mittie Gaskin, MD;  Location: [facility];  Service: Ophthalmology;  Laterality: Right;   CATARACT EXTRACTION W/PHACO Left 03/22/2017   Procedure: CATARACT EXTRACTION PHACO AND INTRAOCULAR LENS PLACEMENT (IOC) LEFT DIABETIC;  Surgeon: Mittie Gaskin, MD;  Location: [facility];  Service: Ophthalmology;  Laterality: Left;  Diabetic - insulin  and oral meds   COLONOSCOPY WITH PROPOFOL  N/A 09/23/2014   Procedure: COLONOSCOPY WITH PROPOFOL ;  Surgeon: Gladis RAYMOND Mariner, MD;  Location: [facility] ENDOSCOPY;  Service: Endoscopy;  Laterality: N/A;   COLONOSCOPY WITH PROPOFOL  N/A 01/11/2018   Procedure: COLONOSCOPY WITH PROPOFOL ;  Surgeon: Mariner Gladis RAYMOND, MD;  Location: [facility] ENDOSCOPY;  Service: Endoscopy;  Laterality: N/A;   COLONOSCOPY WITH PROPOFOL  N/A 04/24/2018   Procedure: COLONOSCOPY WITH PROPOFOL ;  Surgeon: Mariner Gladis RAYMOND, MD;  Location: [facility] ENDOSCOPY;  Service: Endoscopy;  Laterality: N/A;   COLONOSCOPY WITH PROPOFOL  N/A 11/19/2019   Procedure: COLONOSCOPY WITH PROPOFOL ;  Surgeon: Jinny Carmine, MD;  Location: [facility] ENDOSCOPY;  Service: Endoscopy;  Laterality: N/A;   CORONARY ANGIOPLASTY WITH STENT PLACEMENT  2008   CORONARY STENT INTERVENTION N/A 03/24/2020   Procedure: CORONARY STENT INTERVENTION;  Surgeon: Mady Bruckner, MD;  Location: [facility] INVASIVE CV LAB;  Service: Cardiovascular;  Laterality: N/A;   ESOPHAGOGASTRODUODENOSCOPY (EGD) WITH PROPOFOL   N/A 12/30/2014   Procedure: ESOPHAGOGASTRODUODENOSCOPY (EGD) WITH PROPOFOL ;  Surgeon: Gladis RAYMOND Mariner, MD;  Location: [facility] ENDOSCOPY;  Service: Endoscopy;  Laterality: N/A;   ESOPHAGOGASTRODUODENOSCOPY (EGD) WITH PROPOFOL  N/A 07/19/2016   Procedure: ESOPHAGOGASTRODUODENOSCOPY (EGD) WITH PROPOFOL ;  Surgeon: Mariner Gladis RAYMOND, MD;  Location: [facility] ENDOSCOPY;  Service: Endoscopy;  Laterality: N/A;   ESOPHAGOGASTRODUODENOSCOPY (EGD) WITH PROPOFOL  N/A 04/24/2018    Procedure: ESOPHAGOGASTRODUODENOSCOPY (EGD) WITH PROPOFOL ;  Surgeon: Mariner Gladis RAYMOND, MD;  Location: [facility] ENDOSCOPY;  Service: Endoscopy;  Laterality: N/A;   EYE SURGERY  cataracts, detached retina   hysterectomy (other)     LEFT HEART CATH AND CORONARY ANGIOGRAPHY N/A 03/24/2020   Procedure: LEFT HEART CATH AND CORONARY ANGIOGRAPHY;  Surgeon: Mady Bruckner, MD;  Location: [facility] INVASIVE CV LAB;  Service: Cardiovascular;  Laterality: N/A;   Patient Active Problem List   Diagnosis Date Noted   Stroke (HCC) 08/05/2023   Depression with anxiety 08/05/2023   Overweight (BMI 25.0-29.9) 08/05/2023   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) 03/17/2023   Dizziness 02/23/2023   Restrictive airway disease 07/21/2020   Partial thickness rotator cuff tear 03/20/2020   Shoulder pain 03/05/2020   Ataxia 02/28/2020   Difficulty walking 02/28/2020   Physical deconditioning 02/28/2020   Chronic diastolic congestive heart failure (HCC) 11/20/2019   Polyp of ascending colon    Hypokalemia 08/08/2019   TIA (transient ischemic attack) 08/01/2019   Benign hypertensive kidney disease with chronic kidney disease 04/25/2019   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/24/2018   Chronic, continuous use of opioids 03/26/2018   History of adenomatous polyp of colon 01/22/2018   Diverticulosis of colon 01/22/2018   History of CVA (cerebrovascular accident) 11/08/2016   Weakness of left upper extremity 10/30/2016   AAA (abdominal aortic aneurysm) without rupture 08/12/2016   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   Stage 3b chronic kidney disease (HCC) 07/27/2015   Type 2 diabetes mellitus with diabetic chronic kidney disease (HCC) 10/28/2014   Solitary pulmonary nodule on lung CT 08/20/2014   Allergic rhinitis 07/28/2014   Acid reflux 07/28/2014   Chronic obstructive pulmonary disease (HCC) 07/28/2014   Granuloma annulare    Back pain 11/12/2013   Lumbar scoliosis 10/30/2013    Lumbar radiculopathy 10/30/2013   Lumbar canal stenosis 10/30/2013   Proteinuria 10/22/2013   Smokes with greater than 30 pack year history 03/21/2011   Edema 08/18/2010   Hyperlipidemia 03/25/2009   Coronary artery disease 03/25/2009   Primary hypertension 03/25/2009   ONSET DATE: 08/05/2023  REFERRING DIAG: M70.101 (ICD-10-CM) - Weakness of left upper extremity   THERAPY DIAG:  Muscle weakness (generalized)  Other lack of coordination  Rationale for Evaluation and Treatment: Rehabilitation  SUBJECTIVE:  SUBJECTIVE STATEMENT: Daughter confirms pt is eating slightly more, and trying to do more around the house.   Pt accompanied by: family member: daughter, Harrie  PERTINENT HISTORY: Daughter present and provided hx.  Daughter reports pt has had 4 strokes, beginning in 2018, 2019, January of 2025 and the most recent in June of 2025.  Pt had been participating in [facility] since the most recent CVA, and is now eager to transition to outpatient services where PT/OT/SLP have been ordered. Per MEDICAL RECORD NUMBERPMH: of CVA, COPD, Dysarthria, Ataxia, TIA, T2DM, Back pain, scoliosis   PRECAUTIONS: Fall, Loop recorder for Afib  WEIGHT BEARING RESTRICTIONS: No  PAIN:  01/25/24: 3/10 back pain  01/11/24: 3/10 back pain 01/03/24: 3/10 low back 01/01/24: 3/10 low back 12/21/2023: 4 out of 10 low back  pain 12/11/23: chronic back pain with prolonged standing 3/10 11/21/23: 3/10 chronic back pain 11/08/23: Same as below 11/02/23: same as eval below Are you having pain? Yes: NPRS scale: 3/10, can get up to 7-8/10 Pain location: low back and L hip Pain description: achy, sharp, dull Aggravating factors: prolonged standing  Relieving factors: sitting, pain meds, heat/ice   FALLS: Has patient fallen in last 6 months? Yes. Number of falls 1, tripping over cords  LIVING ENVIRONMENT: Lives with: lives with their spouse Lives in: 1 level home Stairs: Yes: External: 1 steps; none Has following  equipment at home: walk in shower, comfort height toilet   PLOF: Prior to Jan of this year, pt was managing all home management tasks, was able to drive, and was caring for spouse   PATIENT GOALS: Strengthening the left arm   OBJECTIVE:  Note: Objective measures were completed at Evaluation unless otherwise noted.  HAND DOMINANCE: Right  ADLs: Overall ADLs: Daughter is pt's primary caregiver and lives near by Transfers/ambulation related to ADLs: occasional help with using walker at home, depending on balance on a given day Eating: indep/able to cut food  Grooming: bimanual grooming without difficulty UB Dressing: extra time for clothing fasteners, but able to gather clothing with RW LB Dressing: extra time with clothing fasteners Toileting: occasional help to walk to the bathroom, but modified indep within the bathroom for toileting tasks Bathing: direct supv for bathing using walkin in shower, shower chair, and grab bar; gets hair washed weekly at salon Tub Shower transfers: supv for walk in shower Equipment: RW, SBQC, shower chair, grab bars  IADLs: Shopping: daughter currently managing Light housekeeping: daughter currently managing; can start laundry on a good day.  Meal Prep: starting to participate in light meal prep, including making eggs on stove top Community mobility: daughter assists with all transportation; pt using transport chair to reach therapy gym today Medication management: daughter sets up pills 2 weeks at a time  Financial management: pt is participating in bill paying with daughter's assistance; most are online Handwriting: 100% legible  POSTURE COMMENTS:  rounded shoulders, forward head, increased thoracic kyphosis  ACTIVITY TOLERANCE: Activity tolerance: to be further assessed within functional contexts  FUNCTIONAL OUTCOME MEASURES: TBD  UPPER EXTREMITY ROM:  BUEs WFL for daily tasks  UPPER EXTREMITY MMT:  Hx of R rotator cuff tear 4-5 years ago  with residual weakness, non-surgical;    MMT Right eval Right 01/03/24 Left eval Left 01/03/24  Shoulder flexion 3+ 3+ 4 4  Shoulder abduction 3+ 3+ 4 4  Shoulder adduction      Shoulder extension      Shoulder internal rotation 3+ 3+ 4- 4  Shoulder external rotation 3+ 3+ 3+ 4-  Elbow flexion 5 5 5 5   Elbow extension 5 5 5 5   Wrist flexion 4+ 5 5 5   Wrist extension 4+ 5 5 5   Wrist ulnar deviation      Wrist radial deviation      Wrist pronation      Wrist supination      (Blank rows = not tested)   *Most recent CVA affecting L non-dominant side, with LUE presenting with increased strength as compared to dominant/unaffected arm.  Assume RUE weaker from hx of rotator cuff tear, or possibly previous CVAs.  Pt does present with mild LUE ataxia, and bilat hand weakness.  HAND FUNCTION: Grip strength: Right: 32 lbs; Left: 30 lbs, Lateral pinch: Right: 8 lbs, Left: 6 lbs, and 3 point pinch:  Right: 7 lbs, Left: 6 lbs Grip strength: Right: 40 lbs; Left: 30 lbs, Lateral pinch: Right: 16 lbs, Left: 12; 3 point pinch: Right: 18 lbs, Left: 13 lbs  01/25/24: Right: 35 lbs; Left: 30 lbs, Lateral pinch: Right 7 lbs, Left: 5 lbs, and 3 point pinch: Right: 7 lbs, Left: 4 lbs   COORDINATION: Finger Nose Finger test: LUE mild ataxia  9 Hole Peg test: Right: 29 sec; Left: 47 sec 01/03/24: Right: 27 sec, Left: 37 sec  01/25/24: Right 27 sec, Left: 35 sec   SENSATION: Pt reports intermittent numbness in the R thumb, IF, and LF  EDEMA: No visible edema  MUSCLE TONE: RUE: Within functional limits and LUE: Within functional limits  COGNITION: Overall cognitive status: Impaired; see SLP eval for details  VISION: Pt reports L eye feels more blurry since first stroke in 2018; pt has reading glasses but doesn't usually wear them   VISION ASSESSMENT: To be further assessed in functional context Tracking/Visual pursuits: Able to track stimulus in all quads without difficulty Saccades: WFL Visual  Fields: no apparent deficits Clock drawing completed: all numbers spaced evenly, but pt left out the 1 and verbalized not knowing how to draw ten minutes to eleven when this direction was given.  Patient has difficulty with following activities due to following visual impairments: To be further assessed  PERCEPTION: To be further assessed  PRAXIS: Impaired: Motor planning  OBSERVATIONS: Pt pleasant, cooperative.  Daughter present and very supportive in pt's care.                                                                                                                       TREATMENT DATE:01/25/24 Therapeutic Activity: -Objective measures taken and goals updated for recertification.  Self Care: -Review of progress towards goals -Participation in item transport to remove items from sink/dishwasher/return to cupboards and drawers, facilitating increased dynamic standing balance, functional reaching, and activity tolerance -Pt heated water in mug using microwave, removed and transported to sink to dump, facilitating same skills noted above, in addition to static standing tolerance x3 min to weight for water to heat. -OT provided min vc for increasing stability/safety during item transport, sliding items on countertop, using 1 hand on countertop for support while ambulating and reaching -Reviewed EC strategies for kitchen tasks, including benefits of using rollator in kitchen for easier item transport and allowing opportunity for seated rest breaks.   PATIENT EDUCATION: Education details: EC, progress towards goals  Person educated: Patient and Child(ren) Education method: Explanation and Verbal cues Education comprehension: verbalized understanding  HOME EXERCISE PROGRAM: Yellow theraputty  GOALS: Goals reviewed with patient? Yes  SHORT TERM GOALS: Target date: 03/07/24  Pt will perform HEP with min vc or less for improving LUE coordination and bilat hand strength. Baseline:  Eval: HEP not yet initiated; 01/03/24: Pt acknowledges minimal participation in HEP in the home, but does have HEP in place with putty and coordination training activities; 01/25/24: see 01/03/24 Goal status: d/c/ not  achieved  2.  Pt will be indep to verbalize and implement 2-3 fall prevention measures to reduce fall risk with ADLs. Baseline: Eval: Educ not yet initiated; 01/03/24: 1 fall last week after reaching to floor level to pick up soiled dog pad while dizzy; pt able to verbalize 3 fall prevention strategies with min vc; 01/25/24: pt uses countertop for support while bending/reaching/ambulating in kitchen, but is inconsistent with walker in the home   Goal status: in progress  LONG TERM GOALS: Target date: 04/18/24  Pt will perform shower transfers with modified indep Baseline: Eval: Direct supv; 01/03/24: direct supv from spouse, which pt plans to continue Goal status: d/c  2.  Pt will improve LUE GMC to enable reaching with good accuracy into overhead kitchen cabinets. Baseline: Eval: Mild LUE ataxia; 01/03/24: Residual mild LUE ataxia and pt denies attempts at reaching into kitchen cupboards d/t family managing meals; 01/25/24: Demonstrated today with good accuracy  Goal status: achieved  3.  Pt will improve L hand [facility] skills as demonstrated by 10 sec or more improvement in 9 hole peg test for improved efficiency when manipulating  clothing fasteners. Baseline: Eval: L 9 hole peg test: L 47 sec, R 29 sec; 01/03/24: L 37 sec, R 27 sec, indep with clothing fasteners Goal status: achieved  4.  Pt will manage light loads of laundry with modified indep. Baseline: Eval: Daughter mostly manages, but pt can start laundry on a good day; 01/03/24: Pt can start laundry, and has been given recommendations for safe reaching strategies and item transport for increasing indep with this task, but has not yet attempted these strategies at home.  Pt continues to rely on family to manage laundry tasks;  01/25/24: Same as 01/03/24 as daughter reports she has not yet implemented recommendations for de-cluttering laundry room to allow space for a chair  Goal status: in progress  5. Pt will perform hot or cold light meal prep with distant supv and RW or rollator for at least 1 meal per day with reported RPE of 6 or less on modified Borg scale. Baseline: 01/03/24: Family currently manages meal preparation; 01/25/24: Pt has started to use microwave and occasionally stove top to reheat food, but verbalizes taxing effort; RPE 7/10 (vigorous activity) on modified Borg scale.   Goal status: Revised on 01/25/24      6.  Pt will verbalize and implement 1-2 EC strategies with min vc or less to enable light sweeping and mopping of kitchen floor with reported RPE of 6 or less on modified Borg scale.      Baseline: 01/25/24: RPE 7-8.  Often relies on daughter.     Goal status: New  ASSESSMENT: CLINICAL IMPRESSION: Pt seen for OT recert this date.  Daughter reports pt is some improvements with increasing food intake during the day, and has started to increase activity level slightly.  Pt reports that she has done some tidying up around the house and reheating meals in the kitchen.  These tasks are currently reported as vigorous, based on reported RPE of 7 today.  Pt also required vc for steadying and implementing EC strategies.  OT has encouraged pt practice strategies at home that were discussed today, and will continue to review and implement strategies in clinic to maximize indep, safety, and efficiency with IADL tasks.     PERFORMANCE DEFICITS: in functional skills including ADLs, IADLs, coordination, dexterity, sensation, ROM, strength, pain, Fine motor control, Gross motor control, mobility, balance, body mechanics, endurance, decreased knowledge  of precautions, decreased knowledge of use of DME, vision, and UE functional use, cognitive skills including emotional, perception, problem solving, safety awareness,  temperament/personality, and understand, and psychosocial skills including coping strategies, environmental adaptation, and routines and behaviors.   IMPAIRMENTS: are limiting patient from ADLs, IADLs, leisure, and social participation.   CO-MORBIDITIES: has co-morbidities such as afib, AAA, hx of multiple CVAs that affects occupational performance. Patient will benefit from skilled OT to address above impairments and improve overall function.  MODIFICATION OR ASSISTANCE TO COMPLETE EVALUATION: Min-Moderate modification of tasks or assist with assess necessary to complete an evaluation.  OT OCCUPATIONAL PROFILE AND HISTORY: Detailed assessment: Review of records and additional review of physical, cognitive, psychosocial history related to current functional performance.  CLINICAL DECISION MAKING: Moderate - several treatment options, min-mod task modification necessary  REHAB POTENTIAL: Good  EVALUATION COMPLEXITY: Moderate    PLAN:  OT FREQUENCY: 2x/week  OT DURATION: 12 weeks  PLANNED INTERVENTIONS: 97168 OT Re-evaluation, 97535 self care/ADL training, 02889 therapeutic exercise, 97530 therapeutic activity, 97112 neuromuscular re-education, 97140 manual therapy, 97116 gait training, 02989 moist heat, 97010 cryotherapy, 97129 Cognitive training (first 15 min), 02249 Physical Performance Testing, balance training, functional mobility training, visual/perceptual remediation/compensation, psychosocial skills training, energy conservation, coping strategies training, patient/family education, and DME and/or AE instructions  RECOMMENDED OTHER SERVICES: none at this time (OT/PT/SLP services have all been ordered)  CONSULTED AND AGREED WITH PLAN OF CARE: Patient and family member/caregiver  PLAN FOR NEXT SESSION: see above  Inocente Blazing, MS, OTR/L  01/28/2024, 8:52 PM

## 2024-01-26 NOTE — Progress Notes (Signed)
 Remote Loop Recorder Transmission

## 2024-01-30 ENCOUNTER — Ambulatory Visit

## 2024-01-30 ENCOUNTER — Ambulatory Visit: Admitting: Physical Therapy

## 2024-01-30 DIAGNOSIS — R4701 Aphasia: Secondary | ICD-10-CM

## 2024-01-30 DIAGNOSIS — R482 Apraxia: Secondary | ICD-10-CM

## 2024-01-30 DIAGNOSIS — R262 Difficulty in walking, not elsewhere classified: Secondary | ICD-10-CM

## 2024-01-30 DIAGNOSIS — M6281 Muscle weakness (generalized): Secondary | ICD-10-CM

## 2024-01-30 DIAGNOSIS — R278 Other lack of coordination: Secondary | ICD-10-CM | POA: Diagnosis not present

## 2024-01-30 DIAGNOSIS — R269 Unspecified abnormalities of gait and mobility: Secondary | ICD-10-CM

## 2024-01-30 DIAGNOSIS — R2681 Unsteadiness on feet: Secondary | ICD-10-CM

## 2024-01-30 DIAGNOSIS — R2689 Other abnormalities of gait and mobility: Secondary | ICD-10-CM

## 2024-01-30 NOTE — Therapy (Signed)
 OUTPATIENT PHYSICAL THERAPY NEURO TREATMENT   Patient Name: ULA COUVILLON MRN: [id number] DOB:01/04/1946, 78 y.o., female Today's Date: 01/30/2024   PCP: Gasper [name] BRAVO, MD REFERRING PROVIDER: Gasper [name] BRAVO, MD  END OF SESSION:    PT End of Session - 01/30/24 1408     Visit Number 16    Number of Visits 39    Date for Recertification  04/18/24    Progress Note Due on Visit 20    PT Start Time 1319    PT Stop Time 1358    PT Time Calculation (min) 39 min    Equipment Utilized During Treatment Gait belt    Activity Tolerance Patient tolerated treatment well    Behavior During Therapy WFL for tasks assessed/performed           Past Medical History:  Diagnosis Date   Acute hemorrhagic colitis 08/08/2019   Anemia    C. difficile colitis 08/09/2019   CAD (coronary artery disease)    a. 02/2006 PCI: BMS x 2 to RCA, cath o/w without significant coronary disease; b. nuclear stress test 07/2014: No ischemia/infarct; c. 11/2017 MV: no isch/infarct, EF 55-65%; d. 03/2020 NSTEMI/PCI: LM nl, LAD min irregs, RI 25, small, LCX nl, RCA 30p/m ISR, 99d (3.0x15 Resolute Onyx DES).   Cataract 02/04/2018   Cerebrovascular accident (CVA) (HCC) 03/18/2023   dysarthria   Chronic bronchitis (HCC)    secondary to cigarette smoking   FHx: allergies    Goiter    Granulomatous disease    Hernia    Kidney stone on left side 2013   NSTEMI (non-ST elevated myocardial infarction) (HCC) 03/23/2020   Oxygen  deficiency    Panic attacks    PVC's (premature ventricular contractions)    a. 03/2018 Zio: Occas PVCs (2.5%). Triggered events assoc w/ PVC/PAC.   Retinal detachment, left 03/04/2020   Retinal tear 2020   Stroke (HCC) 10/29/2016   mild left side weakness   Stroke (HCC) 08/01/2019   Tobacco abuse    Past Surgical History:  Procedure Laterality Date   ABDOMINAL HYSTERECTOMY     BREAST SURGERY     CATARACT EXTRACTION W/PHACO Right 03/01/2017   Procedure: CATARACT EXTRACTION PHACO  AND INTRAOCULAR LENS PLACEMENT (IOC) RIGHT DIABETIC;  Surgeon: Mittie Gaskin, MD;  Location: [facility];  Service: Ophthalmology;  Laterality: Right;   CATARACT EXTRACTION W/PHACO Left 03/22/2017   Procedure: CATARACT EXTRACTION PHACO AND INTRAOCULAR LENS PLACEMENT (IOC) LEFT DIABETIC;  Surgeon: Mittie Gaskin, MD;  Location: [facility];  Service: Ophthalmology;  Laterality: Left;  Diabetic - insulin  and oral meds   COLONOSCOPY WITH PROPOFOL  N/A 09/23/2014   Procedure: COLONOSCOPY WITH PROPOFOL ;  Surgeon: Gladis RAYMOND Mariner, MD;  Location: [facility] ENDOSCOPY;  Service: Endoscopy;  Laterality: N/A;   COLONOSCOPY WITH PROPOFOL  N/A 01/11/2018   Procedure: COLONOSCOPY WITH PROPOFOL ;  Surgeon: Mariner Gladis RAYMOND, MD;  Location: [facility] ENDOSCOPY;  Service: Endoscopy;  Laterality: N/A;   COLONOSCOPY WITH PROPOFOL  N/A 04/24/2018   Procedure: COLONOSCOPY WITH PROPOFOL ;  Surgeon: Mariner Gladis RAYMOND, MD;  Location: [facility] ENDOSCOPY;  Service: Endoscopy;  Laterality: N/A;   COLONOSCOPY WITH PROPOFOL  N/A 11/19/2019   Procedure: COLONOSCOPY WITH PROPOFOL ;  Surgeon: Jinny Carmine, MD;  Location: [facility] ENDOSCOPY;  Service: Endoscopy;  Laterality: N/A;   CORONARY ANGIOPLASTY WITH STENT PLACEMENT  2008   CORONARY STENT INTERVENTION N/A 03/24/2020   Procedure: CORONARY STENT INTERVENTION;  Surgeon: Mady Bruckner, MD;  Location: [facility] INVASIVE CV LAB;  Service: Cardiovascular;  Laterality: N/A;  ESOPHAGOGASTRODUODENOSCOPY (EGD) WITH PROPOFOL  N/A 12/30/2014   Procedure: ESOPHAGOGASTRODUODENOSCOPY (EGD) WITH PROPOFOL ;  Surgeon: Gladis RAYMOND Mariner, MD;  Location: [facility] ENDOSCOPY;  Service: Endoscopy;  Laterality: N/A;   ESOPHAGOGASTRODUODENOSCOPY (EGD) WITH PROPOFOL  N/A 07/19/2016   Procedure: ESOPHAGOGASTRODUODENOSCOPY (EGD) WITH PROPOFOL ;  Surgeon: Mariner Gladis RAYMOND, MD;  Location: [facility] ENDOSCOPY;  Service: Endoscopy;  Laterality: N/A;   ESOPHAGOGASTRODUODENOSCOPY (EGD) WITH PROPOFOL  N/A  04/24/2018   Procedure: ESOPHAGOGASTRODUODENOSCOPY (EGD) WITH PROPOFOL ;  Surgeon: Mariner Gladis RAYMOND, MD;  Location: [facility] ENDOSCOPY;  Service: Endoscopy;  Laterality: N/A;   EYE SURGERY  cataracts, detached retina   hysterectomy (other)     LEFT HEART CATH AND CORONARY ANGIOGRAPHY N/A 03/24/2020   Procedure: LEFT HEART CATH AND CORONARY ANGIOGRAPHY;  Surgeon: Mady Bruckner, MD;  Location: [facility] INVASIVE CV LAB;  Service: Cardiovascular;  Laterality: N/A;   Patient Active Problem List   Diagnosis Date Noted   Stroke (HCC) 08/05/2023   Depression with anxiety 08/05/2023   Overweight (BMI 25.0-29.9) 08/05/2023   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) 03/17/2023   Dizziness 02/23/2023   Restrictive airway disease 07/21/2020   Partial thickness rotator cuff tear 03/20/2020   Shoulder pain 03/05/2020   Ataxia 02/28/2020   Difficulty walking 02/28/2020   Physical deconditioning 02/28/2020   Chronic diastolic congestive heart failure (HCC) 11/20/2019   Polyp of ascending colon    Hypokalemia 08/08/2019   TIA (transient ischemic attack) 08/01/2019   Benign hypertensive kidney disease with chronic kidney disease 04/25/2019   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/24/2018   Chronic, continuous use of opioids 03/26/2018   History of adenomatous polyp of colon 01/22/2018   Diverticulosis of colon 01/22/2018   History of CVA (cerebrovascular accident) 11/08/2016   Weakness of left upper extremity 10/30/2016   AAA (abdominal aortic aneurysm) without rupture 08/12/2016   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   Stage 3b chronic kidney disease (HCC) 07/27/2015   Type 2 diabetes mellitus with diabetic chronic kidney disease (HCC) 10/28/2014   Solitary pulmonary nodule on lung CT 08/20/2014   Allergic rhinitis 07/28/2014   Acid reflux 07/28/2014   Chronic obstructive pulmonary disease (HCC) 07/28/2014   Granuloma annulare    Back pain 11/12/2013   Lumbar  scoliosis 10/30/2013   Lumbar radiculopathy 10/30/2013   Lumbar canal stenosis 10/30/2013   Proteinuria 10/22/2013   Smokes with greater than 30 pack year history 03/21/2011   Edema 08/18/2010   Hyperlipidemia 03/25/2009   Coronary artery disease 03/25/2009   Primary hypertension 03/25/2009    ONSET DATE: 08/05/23  REFERRING DIAG:  Z86.73 (ICD-10-CM) - History of CVA (cerebrovascular accident)  R27.0 (ICD-10-CM) - Ataxia    THERAPY DIAG:   Muscle weakness (generalized)  Unsteadiness on feet  Abnormality of gait and mobility  Other abnormalities of gait and mobility  Difficulty in walking, not elsewhere classified  Rationale for Evaluation and Treatment: Rehabilitation  SUBJECTIVE:  SUBJECTIVE STATEMENT:  Pt is accompanied by daughter.  No reported changes since last session.   Pt accompanied by: self and family member, daughter  PERTINENT HISTORY:  PMH: of CVA, COPD, Dysarthria, Ataxia, TIA, T2DM, Back pain, scoliosis   PAIN:  Are you having pain? Yes: NPRS scale: 3/10 Pain location: low back and L hip Pain description: achy Aggravating factors: just hurts all the time. Relieving factors: hydrocodone  at night  PRECAUTIONS: Other: Pt has a looper to see if pt has a-fib  RED FLAGS: Abdominal aneurysm: Yes: pt reports having an abdominal aneurism as found by Dr. Marea.   WEIGHT BEARING RESTRICTIONS: No  FALLS: Has patient fallen in last 6 months? Yes. Number of falls 1.  Pt reports losing balance, and got caught up in cords that were on the floor when she was trying to close the blinds.  LIVING ENVIRONMENT: Lives with: lives with their spouse Lives in: House/apartment Stairs: Yes: External: 1 steps; none Has following equipment at home: Single point cane, Walker - 2 wheeled,  Environmental Consultant - 4 wheeled, shower chair, and Grab bars  PLOF: Independent  PATIENT GOALS: Pt is wanting to gain independence.  Pt is wanting to improve endurance levels and improving speech with SLP.  OBJECTIVE:  Note: Objective measures were completed at Evaluation unless otherwise noted.  DIAGNOSTIC FINDINGS:   EXAM: MRI HEAD WITHOUT CONTRAST   MRA HEAD WITHOUT CONTRAST   MRA NECK WITHOUT AND WITH CONTRAST   IMPRESSION: 1. Small acute infarcts in the left frontal lobe, right parietal lobe, and left cerebellum. 2. Extensive chronic small vessel ischemic disease. 3. 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 4. Unchanged severe right A3 stenosis or segmental occlusion. 5. Moderate left P2 stenosis. 6. Limited assessment of the left vertebral artery origin, otherwise negative neck MRA.   COGNITION: Overall cognitive status: Within functional limits for tasks assessed   SENSATION: WFL, however pt notes having some numbness in the R hand.  COORDINATION: WFL  POSTURE: rounded shoulders, forward head, decreased lumbar lordosis, increased thoracic kyphosis, posterior pelvic tilt, and flexed trunk    LOWER EXTREMITY MMT:    MMT Right Eval Left Eval  Hip flexion 4 4  Hip extension    Hip abduction 4 4-  Hip adduction 4 4-  Hip internal rotation    Hip external rotation    Knee flexion 4 4-  Knee extension 4 4-  Ankle dorsiflexion 4 4-  Ankle plantarflexion    Ankle inversion    Ankle eversion    (Blank rows = not tested)  BED MOBILITY:  Not tested  FUNCTIONAL TESTS:  5 times sit to stand: 24.36 sec Timed up and go (TUG): 23.94 sec with walker 2 minute walk test: TBD 10 meter walk test: 20.33 sec with walker Berg Balance Scale: TBD  PATIENT SURVEYS:   Stroke Impact Scale 16 (Copyrighted instrument, [facility])  In the past 2 weeks, how difficult was it to...  Rating Scale 5 = Not difficult at all 4 = A little difficult 3 =  Somewhat difficult 2 = Very difficult 1 = Could not do at all  a. Dress the top part of your body? 4  b. Bathe yourself? 2  c. Get to the toilet on time? 5  d. Control your bladder (not have an accident)? 5  e. Control your bowels (not have an accident)? 5  f. Stand without losing balance? 3  g. Go shopping? 2  h. Do  heavy household chores (e.g. vacuum, laundry or  yard work)? 1  i. Stay sitting without losing your balance? 3  j. Walk without losing your balance? 2  k. Move from a bed to a chair? 3  l. Walk fast? 1  m. Climb one flight of stairs? 1  n. Walk one block? 1  o. Get in and out of a car? 2  p. Carry heavy objects (e.g. bag of groceries) with your  affected hand? 1  Sum:  41/80  MDC (Minimal Detectable Change) is >/=8                                                                                                                                 TREATMENT DATE: 01/30/24  TA- To improve functional movements patterns for everyday tasks   Pt arrived to session in transport chair, daughter present.   SpO2: 100% upon arrival to the clinic with room air; seated.   BP= 149/94 mmHg  Left Sitting; HR: 73  BP= 124/80 mmHg Left Standing; HR: 81  Ambulation with CGA, no AD around the gym with focus on purse lip breathing throughout, 2 total laps (320')  BP monitored following the ambulation attempt seated:  SPO2 97%  BP= 160/92 mmHg  Left Sitting; HR: 77  Ambulation with CGA, with use of rollator around the gym with focus on purse lip breathing throughout, 2 total laps (320')  BP monitored following the ambulation attempt:  BP= 158/91 mmHg  Left Sitting; HR: 75  STS 2 x 6 reps   Ambulation with CGA, with use of rollator around the gym with focus on purse lip breathing throughout, 2 total laps (320')  BP 158/92  Ambulate up and down x 4 steps x 2 rounds with B UE assist  Brief seated rest then stand pivot from chair to transport chair to go to SLP session.    SpO2 remained >95% throughout the entirety of the session  Pt required rest breaks due fatigue, PT was attentive to when pt appeared to be tired or winded in order to prevent excessive fatigue.    PATIENT EDUCATION: Education details: Pt educated on role of PT and services provided during current POC, along with prognosis and information about the clinic.  Person educated: Patient and Child(ren) Education method: Explanation Education comprehension: verbalized understanding and returned demonstration  HOME EXERCISE PROGRAM:  Access Code: [email or web address] URL: [email or web address] Date: 10/17/2023 Prepared by: Sidra Simpers  Exercises - Standing March with Counter Support  - 1 x daily - 3-4 x weekly - 3 sets - 10 reps - Standing Knee Flexion with Unilateral Counter Support  - 1 x daily - 7 x weekly - 3 sets - 10 reps - Standing Hip Extension with Unilateral Counter Support  - 1 x daily - 7 x weekly - 3 sets - 10 reps - Standing Hip Abduction with Unilateral Counter Support  - 1 x daily - 7 x weekly -  3 sets - 10 reps - Heel Toe Raises with Unilateral Counter Support  - 1 x daily - 7 x weekly - 3 sets - 10 reps  GOALS: Goals reviewed with patient? Yes  SHORT TERM GOALS: Target date: 11/15/2023  Pt will be independent with HEP in order to demonstrate increased ability to perform tasks related to occupation/hobbies. Baseline: pt given HEP at eval Goal status: INITIAL  LONG TERM GOALS: Target date: 01/10/2024  1.  Patient (> 44 years old) will complete five times sit to stand test in < 15 seconds indicating an increased LE strength and improved balance. Baseline: 24.36 sec Goal status: INITIAL  2.  Patient will improve SIS 16 score to 55   to demonstrate statistically significant improvement in mobility and quality of life as it relates to their CVA.  Baseline: 41 Goal status: INITIAL   3.  Patient will increase Berg Balance score by > 6 points to demonstrate decreased  fall risk during functional activities. Baseline: 33/56 Goal status: INITIAL   4.  Patient will reduce timed up and go to <11 seconds to reduce fall risk and demonstrate improved transfer/gait ability. Baseline: 23.94 sec with walker Goal status: INITIAL  5.  Patient will increase 10 meter walk test to >1.44m/s as to improve gait speed for better community ambulation and to reduce fall risk. Baseline: 20.33 sec with walker Goal status: INITIAL  6.  Patient will increase 2 minute walk test distance by 50 ft or greater for progression to community ambulator and demonstrate improved gait ability  Baseline: 275'; 83.82 m Goal status: INITIAL     ASSESSMENT:  CLINICAL IMPRESSION:   Patient arrived with good motivation for completion of pt activities.   Pt's vitals monitored carefully throughout the session and noted above.  Pt still maintained appropriate symptom response to exercises and safe level vitals.  Will continue to monitor throughout future sessions as well.  Pt tolerated increased exercise load this date compared to last session, still fatigued though and requiring rest breaks between rounds.  Pt will continue to benefit from skilled therapy to address remaining deficits in order to improve overall QoL and return to PLOF.    OBJECTIVE IMPAIRMENTS: Abnormal gait, decreased activity tolerance, decreased balance, decreased endurance, decreased knowledge of use of DME, decreased mobility, difficulty walking, decreased strength, impaired sensation, and pain.   ACTIVITY LIMITATIONS: carrying, lifting, bending, standing, squatting, stairs, transfers, bathing, toileting, dressing, hygiene/grooming, and locomotion level  PARTICIPATION LIMITATIONS: meal prep, cleaning, laundry, community activity, and yard work  PERSONAL FACTORS: Age and 3+ comorbidities:  PMH: of CVA, COPD, Dysarthria, Ataxia, TIA, T2DM, Back pain, scoliosis are also affecting patient's functional outcome.   REHAB  POTENTIAL: Good  CLINICAL DECISION MAKING: Evolving/moderate complexity  EVALUATION COMPLEXITY: Moderate  PLAN:  PT FREQUENCY: 2x/week  PT DURATION: 12 weeks  PLANNED INTERVENTIONS: 97750- Physical Performance Testing, 97110-Therapeutic exercises, 97530- Therapeutic activity, 97112- Neuromuscular re-education, 97535- Self Care, [id number]- Manual therapy, [phone]- Gait training, Balance training, and Vestibular training  PLAN FOR NEXT SESSION:    Monitor SPO2 and get portable oxygen  if necessary  Endurance, balance, strength ( L>R), updated HEP, gait training  -Limiting seated rest breaks as tolerated to challenge endurance   Note: Portions of this document were prepared using Dragon voice recognition software and although reviewed may contain unintentional dictation errors in syntax, grammar, or spelling.  [name] PT ,DPT Physical Therapist- [facility]   01/30/24, 2:08 PM

## 2024-01-30 NOTE — Therapy (Signed)
 OUTPATIENT SPEECH LANGUAGE PATHOLOGY APHASIA TREATMENT   Patient Name: Hannah Kirby MRN: [id number] DOB:1945/10/22, 78 y.o., female Today's Date: 01/30/2024  PCP: [name] Perry, MD REFERRING PROVIDER: [name] Perry, MD   End of Session - 01/30/24 1621     Visit Number 18    Number of Visits 31    Date for Recertification  02/08/24    Progress Note Due on Visit 20    SLP Start Time 1400    SLP Stop Time  1445    SLP Time Calculation (min) 45 min    Activity Tolerance Patient tolerated treatment well          Past Medical History:  Diagnosis Date   Acute hemorrhagic colitis 08/08/2019   Anemia    C. difficile colitis 08/09/2019   CAD (coronary artery disease)    a. 02/2006 PCI: BMS x 2 to RCA, cath o/w without significant coronary disease; b. nuclear stress test 07/2014: No ischemia/infarct; c. 11/2017 MV: no isch/infarct, EF 55-65%; d. 03/2020 NSTEMI/PCI: LM nl, LAD min irregs, RI 25, small, LCX nl, RCA 30p/m ISR, 99d (3.0x15 Resolute Onyx DES).   Cataract 02/04/2018   Cerebrovascular accident (CVA) (HCC) 03/18/2023   dysarthria   Chronic bronchitis (HCC)    secondary to cigarette smoking   FHx: allergies    Goiter    Granulomatous disease    Hernia    Kidney stone on left side 2013   NSTEMI (non-ST elevated myocardial infarction) (HCC) 03/23/2020   Oxygen  deficiency    Panic attacks    PVC's (premature ventricular contractions)    a. 03/2018 Zio: Occas PVCs (2.5%). Triggered events assoc w/ PVC/PAC.   Retinal detachment, left 03/04/2020   Retinal tear 2020   Stroke (HCC) 10/29/2016   mild left side weakness   Stroke (HCC) 08/01/2019   Tobacco abuse    Past Surgical History:  Procedure Laterality Date   ABDOMINAL HYSTERECTOMY     BREAST SURGERY     CATARACT EXTRACTION W/PHACO Right 03/01/2017   Procedure: CATARACT EXTRACTION PHACO AND INTRAOCULAR LENS PLACEMENT (IOC) RIGHT DIABETIC;  Surgeon: Mittie Gaskin, MD;  Location: [facility];  Service:  Ophthalmology;  Laterality: Right;   CATARACT EXTRACTION W/PHACO Left 03/22/2017   Procedure: CATARACT EXTRACTION PHACO AND INTRAOCULAR LENS PLACEMENT (IOC) LEFT DIABETIC;  Surgeon: Mittie Gaskin, MD;  Location: [facility];  Service: Ophthalmology;  Laterality: Left;  Diabetic - insulin  and oral meds   COLONOSCOPY WITH PROPOFOL  N/A 09/23/2014   Procedure: COLONOSCOPY WITH PROPOFOL ;  Surgeon: Gladis RAYMOND Mariner, MD;  Location: [facility] ENDOSCOPY;  Service: Endoscopy;  Laterality: N/A;   COLONOSCOPY WITH PROPOFOL  N/A 01/11/2018   Procedure: COLONOSCOPY WITH PROPOFOL ;  Surgeon: Mariner Gladis RAYMOND, MD;  Location: [facility] ENDOSCOPY;  Service: Endoscopy;  Laterality: N/A;   COLONOSCOPY WITH PROPOFOL  N/A 04/24/2018   Procedure: COLONOSCOPY WITH PROPOFOL ;  Surgeon: Mariner Gladis RAYMOND, MD;  Location: [facility] ENDOSCOPY;  Service: Endoscopy;  Laterality: N/A;   COLONOSCOPY WITH PROPOFOL  N/A 11/19/2019   Procedure: COLONOSCOPY WITH PROPOFOL ;  Surgeon: Jinny Carmine, MD;  Location: [facility] ENDOSCOPY;  Service: Endoscopy;  Laterality: N/A;   CORONARY ANGIOPLASTY WITH STENT PLACEMENT  2008   CORONARY STENT INTERVENTION N/A 03/24/2020   Procedure: CORONARY STENT INTERVENTION;  Surgeon: Mady Bruckner, MD;  Location: [facility] INVASIVE CV LAB;  Service: Cardiovascular;  Laterality: N/A;   ESOPHAGOGASTRODUODENOSCOPY (EGD) WITH PROPOFOL  N/A 12/30/2014   Procedure: ESOPHAGOGASTRODUODENOSCOPY (EGD) WITH PROPOFOL ;  Surgeon: Gladis RAYMOND Mariner, MD;  Location: [facility] ENDOSCOPY;  Service: Endoscopy;  Laterality: N/A;   ESOPHAGOGASTRODUODENOSCOPY (EGD) WITH PROPOFOL  N/A 07/19/2016   Procedure: ESOPHAGOGASTRODUODENOSCOPY (EGD) WITH PROPOFOL ;  Surgeon: Gaylyn Gladis PENNER, MD;  Location: [facility] ENDOSCOPY;  Service: Endoscopy;  Laterality: N/A;   ESOPHAGOGASTRODUODENOSCOPY (EGD) WITH PROPOFOL  N/A 04/24/2018   Procedure: ESOPHAGOGASTRODUODENOSCOPY (EGD) WITH PROPOFOL ;  Surgeon: Gaylyn Gladis PENNER, MD;  Location: [facility] ENDOSCOPY;   Service: Endoscopy;  Laterality: N/A;   EYE SURGERY  cataracts, detached retina   hysterectomy (other)     LEFT HEART CATH AND CORONARY ANGIOGRAPHY N/A 03/24/2020   Procedure: LEFT HEART CATH AND CORONARY ANGIOGRAPHY;  Surgeon: Mady Bruckner, MD;  Location: [facility] INVASIVE CV LAB;  Service: Cardiovascular;  Laterality: N/A;   Patient Active Problem List   Diagnosis Date Noted   Stroke (HCC) 08/05/2023   Depression with anxiety 08/05/2023   Overweight (BMI 25.0-29.9) 08/05/2023   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) 03/17/2023   Dizziness 02/23/2023   Restrictive airway disease 07/21/2020   Partial thickness rotator cuff tear 03/20/2020   Shoulder pain 03/05/2020   Ataxia 02/28/2020   Difficulty walking 02/28/2020   Physical deconditioning 02/28/2020   Chronic diastolic congestive heart failure (HCC) 11/20/2019   Polyp of ascending colon    Hypokalemia 08/08/2019   TIA (transient ischemic attack) 08/01/2019   Benign hypertensive kidney disease with chronic kidney disease 04/25/2019   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/24/2018   Chronic, continuous use of opioids 03/26/2018   History of adenomatous polyp of colon 01/22/2018   Diverticulosis of colon 01/22/2018   History of CVA (cerebrovascular accident) 11/08/2016   Weakness of left upper extremity 10/30/2016   AAA (abdominal aortic aneurysm) without rupture 08/12/2016   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   Stage 3b chronic kidney disease (HCC) 07/27/2015   Type 2 diabetes mellitus with diabetic chronic kidney disease (HCC) 10/28/2014   Solitary pulmonary nodule on lung CT 08/20/2014   Allergic rhinitis 07/28/2014   Acid reflux 07/28/2014   Chronic obstructive pulmonary disease (HCC) 07/28/2014   Granuloma annulare    Back pain 11/12/2013   Lumbar scoliosis 10/30/2013   Lumbar radiculopathy 10/30/2013   Lumbar canal stenosis 10/30/2013   Proteinuria 10/22/2013   Smokes with greater  than 30 pack year history 03/21/2011   Edema 08/18/2010   Hyperlipidemia 03/25/2009   Coronary artery disease 03/25/2009   Primary hypertension 03/25/2009    ONSET DATE: 03/17/23 (admitted with stroke), 05/23/23 (referral date)  REFERRING DIAG:  R41.841 (ICD-10-CM) - Cognitive communication deficit  I63.9 (ICD-10-CM) - CVA (cerebral vascular accident) (HCC)  R47.02 (ICD-10-CM) - Dysphasia    THERAPY DIAG:  Aphasia  Apraxia of speech  Rationale for Evaluation and Treatment Rehabilitation  SUBJECTIVE:   SUBJECTIVE STATEMENT: Pt alert, pleasant, and cooperative.  Pt accompanied by: self and family member  PERTINENT HISTORY:  Pt is a 78 y.o. female who presents for communication evaluation in setting on stroke. Pt in ED 1/24-1/25/25. Pt d/c'd home with HH ST. PMHx significant of   HTN, CKD stage IIIB, DMT2 on insulin , CAD, chronic smoking, chronic respiratory failure with COPD and on home oxygen , anxiety, HLD. MRI 03/17/23 1. Punctate foci of restricted diffusion in the left posterior  frontal white matter, most likely acute infarcts.  2. Numerous foci of hemosiderin deposition, which are seen in the  deep gray structures but also in the bilateral cerebral hemispheres,  with 1 new focus compared to 02/20/2023. While this could be the  sequela of chronic hypertensive microhemorrhages, this appearance is  concerning for cerebral amyloid angiopathy.  PAIN:  Are you having pain? No  FALLS: defer to PT  LIVING ENVIRONMENT: Lives with: lives with their spouse Lives in: House/apartment  PLOF:  Level of assistance: Independent with ADLs Employment: Retired   PATIENT GOALS    for communication to improve   OBJECTIVE:  TODAY'S TREATMENT: Introduced publishing rights manager including its rationale/purpose. Co-generated script with pt for conversation with pt's hairsylist whom she sees weekly. Rehearsed script with pt requiring min cues. Pt encouraged to practice script for HEP and  to bring script with her to her appointment.       PATIENT EDUCATION: Education details: as above Person educated: Patient and Child Education method: Explanation Education comprehension: verbalized understanding and needs further education  HOME EXERCISE PROGRAM:    Practice script      GOALS:  Goals reviewed with patient? Yes  SHORT TERM GOALS: Target date: 10 sessions  Pt will participate in further assessment of functional reading/writing. Baseline: Goal status: MET  2.  With Moderate A, patient will complete a semantic feature analysis with at least 2 relevant features for 8/10 target words to improve word-finding skills.  Baseline:  Goal status: PROGRESSING; continue goal on 01/11/24  3.  With Maximal A, patient will generate sentences with 3 or more words in response to a situation at 80% accuracy in order to increase ability to communicate basic wants and needs.  Baseline:  Goal status: PROGRESSING; continue goal on 01/11/24  4.   With Maximal A,  pt will follow 2-step commands for improved participation in ADLs/iADLs with max cueing. Baseline:  Goal status: MET  5.  Pt will repeat short sentences (<4 words) with good approximations 80% of the time with max cueing. Baseline:  Goal status: MET     LONG TERM GOALS: Target date: 4 weeks; 02/08/24  Pt will report a subjective improvement in communication per PROM.  Baseline: CES 12/32 on 8/26 Goal status: INITIAL; continue goal  2.  With Min A, patient/family will demonstrate understanding of the following concepts: aphasia, spontaneous recovery, communication vs conversation, strengths/strategies to promote success, local resources in order to increase patient's participation in medical care.    Baseline:  Goal status: MET  3. Pt and/or daughter will demonstrate understanding of multiple ways to stimulate pt cognitively outside of ST.   Baseline:  Goal status: INITIAL   ASSESSMENT:  CLINICAL  IMPRESSION: Pt is a 78 y.o. female who presents for communication treatment in setting on stroke. Pt known to SLP services and was receiving OP ST prior to most recent stroke 08/05/23. Pt d/c'd home with [facility] ST who is working on securing SGD for pt. PMHx significant of  HTN, CKD stage IIIB, DMT2 on insulin , CAD, chronic smoking, chronic respiratory failure with COPD and on home oxygen , anxiety, HLD. Re-assessment completed 01/10/34 via formal assessment Western Aphasia Battery Revised (WAB-R). 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 Noted improvement in pt's auditory comprehension and repetition; however, difficulty with spontaneous speech and naming/wordfinding persist. Course of therapy, thus far this episode, has be limited by pt's illness with patient missing 8 of 23 scheduled sessions and pt's limited support/cognitive stimulationat home. Recommend continued course of skilled ST on a trial basis for the next 4 weeks with pt in agreement to complete home assignments and attend therapy sessions regularly. See details of tx session above. Recommend course of ST targeting functional communication, further assessment of functional reading/writing, and pt/caregiver  training to help promote QoL and overall life participation.   OBJECTIVE IMPAIRMENTS include expressive language, receptive language, and aphasia. These impairments are limiting patient from ADLs/IADLs and effectively communicating at home and in community. Factors affecting potential to achieve goals and functional outcome are severity of impairments. Patient will benefit from skilled SLP services to address above impairments and improve overall function.  REHAB POTENTIAL: Good  PLAN: SLP FREQUENCY: 2x/week  SLP DURATION: 4 weeks  PLANNED INTERVENTIONS: Language facilitation, Cueing hierachy, Internal/external aids, Functional tasks,  Multimodal communication approach, SLP instruction and feedback, Compensatory strategies, and Patient/family education    Delon Bangs, M.S., CCC-SLP Speech-Language Pathologist [facility] [phone] [address])  [facility] [address] [place], [place], [address] Phone: [phone]   Fax:  [phone]

## 2024-01-31 ENCOUNTER — Ambulatory Visit: Admitting: Family Medicine

## 2024-02-01 ENCOUNTER — Ambulatory Visit: Admitting: Physical Therapy

## 2024-02-01 ENCOUNTER — Ambulatory Visit

## 2024-02-01 DIAGNOSIS — M6281 Muscle weakness (generalized): Secondary | ICD-10-CM

## 2024-02-01 DIAGNOSIS — R262 Difficulty in walking, not elsewhere classified: Secondary | ICD-10-CM

## 2024-02-01 DIAGNOSIS — R269 Unspecified abnormalities of gait and mobility: Secondary | ICD-10-CM

## 2024-02-01 DIAGNOSIS — R4701 Aphasia: Secondary | ICD-10-CM

## 2024-02-01 DIAGNOSIS — R278 Other lack of coordination: Secondary | ICD-10-CM | POA: Diagnosis not present

## 2024-02-01 DIAGNOSIS — R2681 Unsteadiness on feet: Secondary | ICD-10-CM

## 2024-02-01 DIAGNOSIS — R482 Apraxia: Secondary | ICD-10-CM

## 2024-02-01 DIAGNOSIS — R2689 Other abnormalities of gait and mobility: Secondary | ICD-10-CM

## 2024-02-01 NOTE — Therapy (Signed)
 OUTPATIENT PHYSICAL THERAPY NEURO TREATMENT   Patient Name: Hannah Kirby MRN: [id number] DOB:1945-04-07, 78 y.o., female Today's Date: 02/01/2024   PCP: Gasper [name] BRAVO, MD REFERRING PROVIDER: Gasper [name] BRAVO, MD  END OF SESSION:    PT End of Session - 02/01/24 1502     Visit Number 17    Number of Visits 39    Date for Recertification  04/18/24    Progress Note Due on Visit 20    PT Start Time 1315    PT Stop Time 1355    PT Time Calculation (min) 40 min    Equipment Utilized During Treatment Gait belt    Activity Tolerance Patient tolerated treatment well    Behavior During Therapy WFL for tasks assessed/performed          Past Medical History:  Diagnosis Date   Acute hemorrhagic colitis 08/08/2019   Anemia    C. difficile colitis 08/09/2019   CAD (coronary artery disease)    a. 02/2006 PCI: BMS x 2 to RCA, cath o/w without significant coronary disease; b. nuclear stress test 07/2014: No ischemia/infarct; c. 11/2017 MV: no isch/infarct, EF 55-65%; d. 03/2020 NSTEMI/PCI: LM nl, LAD min irregs, RI 25, small, LCX nl, RCA 30p/m ISR, 99d (3.0x15 Resolute Onyx DES).   Cataract 02/04/2018   Cerebrovascular accident (CVA) (HCC) 03/18/2023   dysarthria   Chronic bronchitis (HCC)    secondary to cigarette smoking   FHx: allergies    Goiter    Granulomatous disease    Hernia    Kidney stone on left side 2013   NSTEMI (non-ST elevated myocardial infarction) (HCC) 03/23/2020   Oxygen  deficiency    Panic attacks    PVC's (premature ventricular contractions)    a. 03/2018 Zio: Occas PVCs (2.5%). Triggered events assoc w/ PVC/PAC.   Retinal detachment, left 03/04/2020   Retinal tear 2020   Stroke (HCC) 10/29/2016   mild left side weakness   Stroke (HCC) 08/01/2019   Tobacco abuse    Past Surgical History:  Procedure Laterality Date   ABDOMINAL HYSTERECTOMY     BREAST SURGERY     CATARACT EXTRACTION W/PHACO Right 03/01/2017   Procedure: CATARACT EXTRACTION PHACO AND  INTRAOCULAR LENS PLACEMENT (IOC) RIGHT DIABETIC;  Surgeon: Mittie Gaskin, MD;  Location: [facility];  Service: Ophthalmology;  Laterality: Right;   CATARACT EXTRACTION W/PHACO Left 03/22/2017   Procedure: CATARACT EXTRACTION PHACO AND INTRAOCULAR LENS PLACEMENT (IOC) LEFT DIABETIC;  Surgeon: Mittie Gaskin, MD;  Location: [facility];  Service: Ophthalmology;  Laterality: Left;  Diabetic - insulin  and oral meds   COLONOSCOPY WITH PROPOFOL  N/A 09/23/2014   Procedure: COLONOSCOPY WITH PROPOFOL ;  Surgeon: Gladis RAYMOND Mariner, MD;  Location: [facility] ENDOSCOPY;  Service: Endoscopy;  Laterality: N/A;   COLONOSCOPY WITH PROPOFOL  N/A 01/11/2018   Procedure: COLONOSCOPY WITH PROPOFOL ;  Surgeon: Mariner Gladis RAYMOND, MD;  Location: [facility] ENDOSCOPY;  Service: Endoscopy;  Laterality: N/A;   COLONOSCOPY WITH PROPOFOL  N/A 04/24/2018   Procedure: COLONOSCOPY WITH PROPOFOL ;  Surgeon: Mariner Gladis RAYMOND, MD;  Location: [facility] ENDOSCOPY;  Service: Endoscopy;  Laterality: N/A;   COLONOSCOPY WITH PROPOFOL  N/A 11/19/2019   Procedure: COLONOSCOPY WITH PROPOFOL ;  Surgeon: Jinny Carmine, MD;  Location: [facility] ENDOSCOPY;  Service: Endoscopy;  Laterality: N/A;   CORONARY ANGIOPLASTY WITH STENT PLACEMENT  2008   CORONARY STENT INTERVENTION N/A 03/24/2020   Procedure: CORONARY STENT INTERVENTION;  Surgeon: Mady Bruckner, MD;  Location: [facility] INVASIVE CV LAB;  Service: Cardiovascular;  Laterality: N/A;   ESOPHAGOGASTRODUODENOSCOPY (  EGD) WITH PROPOFOL  N/A 12/30/2014   Procedure: ESOPHAGOGASTRODUODENOSCOPY (EGD) WITH PROPOFOL ;  Surgeon: Gladis RAYMOND Mariner, MD;  Location: [facility] ENDOSCOPY;  Service: Endoscopy;  Laterality: N/A;   ESOPHAGOGASTRODUODENOSCOPY (EGD) WITH PROPOFOL  N/A 07/19/2016   Procedure: ESOPHAGOGASTRODUODENOSCOPY (EGD) WITH PROPOFOL ;  Surgeon: Mariner Gladis RAYMOND, MD;  Location: [facility] ENDOSCOPY;  Service: Endoscopy;  Laterality: N/A;   ESOPHAGOGASTRODUODENOSCOPY (EGD) WITH PROPOFOL  N/A 04/24/2018    Procedure: ESOPHAGOGASTRODUODENOSCOPY (EGD) WITH PROPOFOL ;  Surgeon: Mariner Gladis RAYMOND, MD;  Location: [facility] ENDOSCOPY;  Service: Endoscopy;  Laterality: N/A;   EYE SURGERY  cataracts, detached retina   hysterectomy (other)     LEFT HEART CATH AND CORONARY ANGIOGRAPHY N/A 03/24/2020   Procedure: LEFT HEART CATH AND CORONARY ANGIOGRAPHY;  Surgeon: Mady Bruckner, MD;  Location: [facility] INVASIVE CV LAB;  Service: Cardiovascular;  Laterality: N/A;   Patient Active Problem List   Diagnosis Date Noted   Stroke (HCC) 08/05/2023   Depression with anxiety 08/05/2023   Overweight (BMI 25.0-29.9) 08/05/2023   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) 03/17/2023   Dizziness 02/23/2023   Restrictive airway disease 07/21/2020   Partial thickness rotator cuff tear 03/20/2020   Shoulder pain 03/05/2020   Ataxia 02/28/2020   Difficulty walking 02/28/2020   Physical deconditioning 02/28/2020   Chronic diastolic congestive heart failure (HCC) 11/20/2019   Polyp of ascending colon    Hypokalemia 08/08/2019   TIA (transient ischemic attack) 08/01/2019   Benign hypertensive kidney disease with chronic kidney disease 04/25/2019   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/24/2018   Chronic, continuous use of opioids 03/26/2018   History of adenomatous polyp of colon 01/22/2018   Diverticulosis of colon 01/22/2018   History of CVA (cerebrovascular accident) 11/08/2016   Weakness of left upper extremity 10/30/2016   AAA (abdominal aortic aneurysm) without rupture 08/12/2016   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   Stage 3b chronic kidney disease (HCC) 07/27/2015   Type 2 diabetes mellitus with diabetic chronic kidney disease (HCC) 10/28/2014   Solitary pulmonary nodule on lung CT 08/20/2014   Allergic rhinitis 07/28/2014   Acid reflux 07/28/2014   Chronic obstructive pulmonary disease (HCC) 07/28/2014   Granuloma annulare    Back pain 11/12/2013   Lumbar scoliosis 10/30/2013    Lumbar radiculopathy 10/30/2013   Lumbar canal stenosis 10/30/2013   Proteinuria 10/22/2013   Smokes with greater than 30 pack year history 03/21/2011   Edema 08/18/2010   Hyperlipidemia 03/25/2009   Coronary artery disease 03/25/2009   Primary hypertension 03/25/2009    ONSET DATE: 08/05/23  REFERRING DIAG:  Z86.73 (ICD-10-CM) - History of CVA (cerebrovascular accident)  R27.0 (ICD-10-CM) - Ataxia    THERAPY DIAG:   Muscle weakness (generalized)  Unsteadiness on feet  Abnormality of gait and mobility  Other abnormalities of gait and mobility  Difficulty in walking, not elsewhere classified  Other lack of coordination  Rationale for Evaluation and Treatment: Rehabilitation  SUBJECTIVE:  SUBJECTIVE STATEMENT:  Pt doing well and is accompanied by daughter. No reported changes since last session. Pts daughter reports she may be getting a cold.  Pt accompanied by: self and family member, daughter  PERTINENT HISTORY:  PMH: of CVA, COPD, Dysarthria, Ataxia, TIA, T2DM, Back pain, scoliosis   PAIN:  Are you having pain? Yes: NPRS scale: 3/10 Pain location: low back and L hip Pain description: achy Aggravating factors: just hurts all the time. Relieving factors: hydrocodone  at night  PRECAUTIONS: Other: Pt has a looper to see if pt has a-fib  RED FLAGS: Abdominal aneurysm: Yes: pt reports having an abdominal aneurism as found by Dr. Marea.   WEIGHT BEARING RESTRICTIONS: No  FALLS: Has patient fallen in last 6 months? Yes. Number of falls 1.  Pt reports losing balance, and got caught up in cords that were on the floor when she was trying to close the blinds.  LIVING ENVIRONMENT: Lives with: lives with their spouse Lives in: House/apartment Stairs: Yes: External: 1 steps; none Has  following equipment at home: Single point cane, Walker - 2 wheeled, Environmental Consultant - 4 wheeled, shower chair, and Grab bars  PLOF: Independent  PATIENT GOALS: Pt is wanting to gain independence.  Pt is wanting to improve endurance levels and improving speech with SLP.  OBJECTIVE:  Note: Objective measures were completed at Evaluation unless otherwise noted.  DIAGNOSTIC FINDINGS:   EXAM: MRI HEAD WITHOUT CONTRAST   MRA HEAD WITHOUT CONTRAST   MRA NECK WITHOUT AND WITH CONTRAST   IMPRESSION: 1. Small acute infarcts in the left frontal lobe, right parietal lobe, and left cerebellum. 2. Extensive chronic small vessel ischemic disease. 3. 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 4. Unchanged severe right A3 stenosis or segmental occlusion. 5. Moderate left P2 stenosis. 6. Limited assessment of the left vertebral artery origin, otherwise negative neck MRA.   COGNITION: Overall cognitive status: Within functional limits for tasks assessed   SENSATION: WFL, however pt notes having some numbness in the R hand.  COORDINATION: WFL  POSTURE: rounded shoulders, forward head, decreased lumbar lordosis, increased thoracic kyphosis, posterior pelvic tilt, and flexed trunk    LOWER EXTREMITY MMT:    MMT Right Eval Left Eval  Hip flexion 4 4  Hip extension    Hip abduction 4 4-  Hip adduction 4 4-  Hip internal rotation    Hip external rotation    Knee flexion 4 4-  Knee extension 4 4-  Ankle dorsiflexion 4 4-  Ankle plantarflexion    Ankle inversion    Ankle eversion    (Blank rows = not tested)  BED MOBILITY:  Not tested  FUNCTIONAL TESTS:  5 times sit to stand: 24.36 sec Timed up and go (TUG): 23.94 sec with walker 2 minute walk test: TBD 10 meter walk test: 20.33 sec with walker Berg Balance Scale: TBD  PATIENT SURVEYS:   Stroke Impact Scale 16 (Copyrighted instrument, [facility])  In the past 2 weeks, how difficult was it to...   Rating Scale 5 = Not difficult at all 4 = A little difficult 3 = Somewhat difficult 2 = Very difficult 1 = Could not do at all  a. Dress the top part of your body? 4  b. Bathe yourself? 2  c. Get to the toilet on time? 5  d. Control your bladder (not have an accident)? 5  e. Control your bowels (not have an accident)? 5  f. Stand without losing  balance? 3  g. Go shopping? 2  h. Do heavy household chores (e.g. vacuum, laundry or  yard work)? 1  i. Stay sitting without losing your balance? 3  j. Walk without losing your balance? 2  k. Move from a bed to a chair? 3  l. Walk fast? 1  m. Climb one flight of stairs? 1  n. Walk one block? 1  o. Get in and out of a car? 2  p. Carry heavy objects (e.g. bag of groceries) with your  affected hand? 1  Sum:  41/80  MDC (Minimal Detectable Change) is >/=8                                                                                                                                 TREATMENT DATE: 02/01/2024  TA- To improve functional movements patterns for everyday tasks   Pt arrived to session in transport chair, daughter present.   SpO2: 97% upon arrival to the clinic with room air; seated.  BP= 141/78 mmHg  Left Sitting; HR: 74 BP= 118/68 mmHg Left Standing; HR: 78  Ambulation with CGA, no AD around the gym with focus on purse lip breathing throughout, 2 total laps (320') -Pt became very fatigued at end of second lap and began reaching for wall and other furniture   BP monitored following the ambulation attempt  seated: SPO2 93% -Maintained seat until spo2 returned to 95% BP= 150/75 mmHg  Left Sitting; HR: 76  Ambulation with CGA, with use of rollator around the gym with focus on purse lip breathing throughout, 2 total laps (320')  BP monitored following the ambulation attempt: BP= 158/91 mmHg  Left Sitting; HR: 75  STS 2 x 6 reps with UE support  -SP02 94%   NMR: To facilitate reeducation of movement, balance,  posture, coordination, and/or proprioception/kinesthetic sense.   Standing with one foot elevated at stairs balance 2 x 30 sec  Unless otherwise stated, CGA was provided and gait belt donned in order to ensure pt safety   Pt required rest breaks due fatigue, PT was attentive to when pt appeared to be tired or winded in order to prevent excessive fatigue.   PATIENT EDUCATION: Education details: Pt educated on role of PT and services provided during current POC, along with prognosis and information about the clinic.  Person educated: Patient and Child(ren) Education method: Explanation Education comprehension: verbalized understanding and returned demonstration  HOME EXERCISE PROGRAM:  Access Code: [email or web address] URL: [email or web address] Date: 10/17/2023 Prepared by: Sidra Simpers  Exercises - Standing March with Counter Support  - 1 x daily - 3-4 x weekly - 3 sets - 10 reps - Standing Knee Flexion with Unilateral Counter Support  - 1 x daily - 7 x weekly - 3 sets - 10 reps - Standing Hip Extension with Unilateral Counter Support  - 1 x daily - 7 x weekly - 3 sets - 10 reps - Standing Hip Abduction  with Unilateral Counter Support  - 1 x daily - 7 x weekly - 3 sets - 10 reps - Heel Toe Raises with Unilateral Counter Support  - 1 x daily - 7 x weekly - 3 sets - 10 reps  GOALS: Goals reviewed with patient? Yes  SHORT TERM GOALS: Target date: 11/15/2023  Pt will be independent with HEP in order to demonstrate increased ability to perform tasks related to occupation/hobbies. Baseline: pt given HEP at eval Goal status: INITIAL  LONG TERM GOALS: Target date: 01/10/2024  1.  Patient (> 23 years old) will complete five times sit to stand test in < 15 seconds indicating an increased LE strength and improved balance. Baseline: 24.36 sec Goal status: INITIAL  2.  Patient will improve SIS 16 score to 55   to demonstrate statistically significant improvement in mobility and  quality of life as it relates to their CVA.  Baseline: 41 Goal status: INITIAL   3.  Patient will increase Berg Balance score by > 6 points to demonstrate decreased fall risk during functional activities. Baseline: 33/56 Goal status: INITIAL   4.  Patient will reduce timed up and go to <11 seconds to reduce fall risk and demonstrate improved transfer/gait ability. Baseline: 23.94 sec with walker Goal status: INITIAL  5.  Patient will increase 10 meter walk test to >1.46m/s as to improve gait speed for better community ambulation and to reduce fall risk. Baseline: 20.33 sec with walker Goal status: INITIAL  6.  Patient will increase 2 minute walk test distance by 50 ft or greater for progression to community ambulator and demonstrate improved gait ability  Baseline: 275'; 83.82 m Goal status: INITIAL     ASSESSMENT:  CLINICAL IMPRESSION:  Patient arrived with good motivation for completion of pt activities.   Pt's vitals monitored carefully throughout the session and noted above. Pt experienced one drop in O2 to 93% after gait without AD, but returned to 95% after ~30 sec of seated rest and pursed lip breathing. Will continue to monitor throughout future sessions as well. Towards the end of 386ft pt began to reach for wall and other objects for support due to fatigue. After which, pts daughter reported pt may be developing a cold. Gait was then modified to include rollator and decreased distance. Pt was most challenged this session with SLS with LE supported on stairs. Pt was able to maintain for 30 sec with minimal sway. Pt will continue to benefit from skilled therapy to address remaining deficits in order to improve overall QoL and return to PLOF.    OBJECTIVE IMPAIRMENTS: Abnormal gait, decreased activity tolerance, decreased balance, decreased endurance, decreased knowledge of use of DME, decreased mobility, difficulty walking, decreased strength, impaired sensation, and pain.    ACTIVITY LIMITATIONS: carrying, lifting, bending, standing, squatting, stairs, transfers, bathing, toileting, dressing, hygiene/grooming, and locomotion level  PARTICIPATION LIMITATIONS: meal prep, cleaning, laundry, community activity, and yard work  PERSONAL FACTORS: Age and 3+ comorbidities:  PMH: of CVA, COPD, Dysarthria, Ataxia, TIA, T2DM, Back pain, scoliosis are also affecting patient's functional outcome.   REHAB POTENTIAL: Good  CLINICAL DECISION MAKING: Evolving/moderate complexity  EVALUATION COMPLEXITY: Moderate  PLAN:  PT FREQUENCY: 2x/week  PT DURATION: 12 weeks  PLANNED INTERVENTIONS: 97750- Physical Performance Testing, 97110-Therapeutic exercises, 97530- Therapeutic activity, [id number]- Neuromuscular re-education, 97535- Self Care, [id number]- Manual therapy, [phone]- Gait training, Balance training, and Vestibular training  PLAN FOR NEXT SESSION:    Monitor SPO2 and get portable oxygen  if necessary  Endurance, balance,  strength ( L>R), updated HEP, gait training  -Limiting seated rest breaks as tolerated to challenge endurance   Note: Portions of this document were prepared using Dragon voice recognition software and although reviewed may contain unintentional dictation errors in syntax, grammar, or spelling.  [place], [place]   02/01/2024, 3:02 PM  This entire session was performed under direct supervision and direction of a licensed therapist/therapist assistant . I have personally read, edited and approve of the note as written.    This licensed clinician was present and actively directing care throughout the session at all times.  [name] PT ,DPT Physical Therapist- [place]  [facility]

## 2024-02-01 NOTE — Therapy (Signed)
 OUTPATIENT SPEECH LANGUAGE PATHOLOGY APHASIA TREATMENT   Patient Name: Hannah Kirby MRN: [id number] DOB:1945-03-23, 78 y.o., female Today's Date: 02/01/2024  PCP: [name] Perry, MD REFERRING PROVIDER: [name] Perry, MD   End of Session - 02/01/24 1344     Visit Number 19    Number of Visits 31    Date for Recertification  02/08/24    Progress Note Due on Visit 20    SLP Start Time 1445    SLP Stop Time  1530    SLP Time Calculation (min) 45 min    Activity Tolerance Patient tolerated treatment well          Past Medical History:  Diagnosis Date   Acute hemorrhagic colitis 08/08/2019   Anemia    C. difficile colitis 08/09/2019   CAD (coronary artery disease)    a. 02/2006 PCI: BMS x 2 to RCA, cath o/w without significant coronary disease; b. nuclear stress test 07/2014: No ischemia/infarct; c. 11/2017 MV: no isch/infarct, EF 55-65%; d. 03/2020 NSTEMI/PCI: LM nl, LAD min irregs, RI 25, small, LCX nl, RCA 30p/m ISR, 99d (3.0x15 Resolute Onyx DES).   Cataract 02/04/2018   Cerebrovascular accident (CVA) (HCC) 03/18/2023   dysarthria   Chronic bronchitis (HCC)    secondary to cigarette smoking   FHx: allergies    Goiter    Granulomatous disease    Hernia    Kidney stone on left side 2013   NSTEMI (non-ST elevated myocardial infarction) (HCC) 03/23/2020   Oxygen  deficiency    Panic attacks    PVC's (premature ventricular contractions)    a. 03/2018 Zio: Occas PVCs (2.5%). Triggered events assoc w/ PVC/PAC.   Retinal detachment, left 03/04/2020   Retinal tear 2020   Stroke (HCC) 10/29/2016   mild left side weakness   Stroke (HCC) 08/01/2019   Tobacco abuse    Past Surgical History:  Procedure Laterality Date   ABDOMINAL HYSTERECTOMY     BREAST SURGERY     CATARACT EXTRACTION W/PHACO Right 03/01/2017   Procedure: CATARACT EXTRACTION PHACO AND INTRAOCULAR LENS PLACEMENT (IOC) RIGHT DIABETIC;  Surgeon: Mittie Gaskin, MD;  Location: [facility];   Service: Ophthalmology;  Laterality: Right;   CATARACT EXTRACTION W/PHACO Left 03/22/2017   Procedure: CATARACT EXTRACTION PHACO AND INTRAOCULAR LENS PLACEMENT (IOC) LEFT DIABETIC;  Surgeon: Mittie Gaskin, MD;  Location: [facility];  Service: Ophthalmology;  Laterality: Left;  Diabetic - insulin  and oral meds   COLONOSCOPY WITH PROPOFOL  N/A 09/23/2014   Procedure: COLONOSCOPY WITH PROPOFOL ;  Surgeon: Gladis RAYMOND Mariner, MD;  Location: [facility] ENDOSCOPY;  Service: Endoscopy;  Laterality: N/A;   COLONOSCOPY WITH PROPOFOL  N/A 01/11/2018   Procedure: COLONOSCOPY WITH PROPOFOL ;  Surgeon: Mariner Gladis RAYMOND, MD;  Location: [facility] ENDOSCOPY;  Service: Endoscopy;  Laterality: N/A;   COLONOSCOPY WITH PROPOFOL  N/A 04/24/2018   Procedure: COLONOSCOPY WITH PROPOFOL ;  Surgeon: Mariner Gladis RAYMOND, MD;  Location: [facility] ENDOSCOPY;  Service: Endoscopy;  Laterality: N/A;   COLONOSCOPY WITH PROPOFOL  N/A 11/19/2019   Procedure: COLONOSCOPY WITH PROPOFOL ;  Surgeon: Jinny Carmine, MD;  Location: [facility] ENDOSCOPY;  Service: Endoscopy;  Laterality: N/A;   CORONARY ANGIOPLASTY WITH STENT PLACEMENT  2008   CORONARY STENT INTERVENTION N/A 03/24/2020   Procedure: CORONARY STENT INTERVENTION;  Surgeon: Mady Bruckner, MD;  Location: [facility] INVASIVE CV LAB;  Service: Cardiovascular;  Laterality: N/A;   ESOPHAGOGASTRODUODENOSCOPY (EGD) WITH PROPOFOL  N/A 12/30/2014   Procedure: ESOPHAGOGASTRODUODENOSCOPY (EGD) WITH PROPOFOL ;  Surgeon: Gladis RAYMOND Mariner, MD;  Location: [facility] ENDOSCOPY;  Service: Endoscopy;  Laterality: N/A;   ESOPHAGOGASTRODUODENOSCOPY (EGD) WITH PROPOFOL  N/A 07/19/2016   Procedure: ESOPHAGOGASTRODUODENOSCOPY (EGD) WITH PROPOFOL ;  Surgeon: Gaylyn Gladis PENNER, MD;  Location: [facility] ENDOSCOPY;  Service: Endoscopy;  Laterality: N/A;   ESOPHAGOGASTRODUODENOSCOPY (EGD) WITH PROPOFOL  N/A 04/24/2018   Procedure: ESOPHAGOGASTRODUODENOSCOPY (EGD) WITH PROPOFOL ;  Surgeon: Gaylyn Gladis PENNER, MD;  Location: [facility]  ENDOSCOPY;  Service: Endoscopy;  Laterality: N/A;   EYE SURGERY  cataracts, detached retina   hysterectomy (other)     LEFT HEART CATH AND CORONARY ANGIOGRAPHY N/A 03/24/2020   Procedure: LEFT HEART CATH AND CORONARY ANGIOGRAPHY;  Surgeon: Mady Bruckner, MD;  Location: [facility] INVASIVE CV LAB;  Service: Cardiovascular;  Laterality: N/A;   Patient Active Problem List   Diagnosis Date Noted   Stroke (HCC) 08/05/2023   Depression with anxiety 08/05/2023   Overweight (BMI 25.0-29.9) 08/05/2023   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) 03/17/2023   Dizziness 02/23/2023   Restrictive airway disease 07/21/2020   Partial thickness rotator cuff tear 03/20/2020   Shoulder pain 03/05/2020   Ataxia 02/28/2020   Difficulty walking 02/28/2020   Physical deconditioning 02/28/2020   Chronic diastolic congestive heart failure (HCC) 11/20/2019   Polyp of ascending colon    Hypokalemia 08/08/2019   TIA (transient ischemic attack) 08/01/2019   Benign hypertensive kidney disease with chronic kidney disease 04/25/2019   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/24/2018   Chronic, continuous use of opioids 03/26/2018   History of adenomatous polyp of colon 01/22/2018   Diverticulosis of colon 01/22/2018   History of CVA (cerebrovascular accident) 11/08/2016   Weakness of left upper extremity 10/30/2016   AAA (abdominal aortic aneurysm) without rupture 08/12/2016   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   Stage 3b chronic kidney disease (HCC) 07/27/2015   Type 2 diabetes mellitus with diabetic chronic kidney disease (HCC) 10/28/2014   Solitary pulmonary nodule on lung CT 08/20/2014   Allergic rhinitis 07/28/2014   Acid reflux 07/28/2014   Chronic obstructive pulmonary disease (HCC) 07/28/2014   Granuloma annulare    Back pain 11/12/2013   Lumbar scoliosis 10/30/2013   Lumbar radiculopathy 10/30/2013   Lumbar canal stenosis 10/30/2013   Proteinuria 10/22/2013   Smokes  with greater than 30 pack year history 03/21/2011   Edema 08/18/2010   Hyperlipidemia 03/25/2009   Coronary artery disease 03/25/2009   Primary hypertension 03/25/2009    ONSET DATE: 03/17/23 (admitted with stroke), 05/23/23 (referral date)  REFERRING DIAG:  R41.841 (ICD-10-CM) - Cognitive communication deficit  I63.9 (ICD-10-CM) - CVA (cerebral vascular accident) (HCC)  R47.02 (ICD-10-CM) - Dysphasia    THERAPY DIAG:  Aphasia  Apraxia of speech  Rationale for Evaluation and Treatment Rehabilitation  SUBJECTIVE:   SUBJECTIVE STATEMENT: Pt alert, pleasant, and cooperative.  Pt accompanied by: self and family member  PERTINENT HISTORY:  Pt is a 78 y.o. female who presents for communication evaluation in setting on stroke. Pt in ED 1/24-1/25/25. Pt d/c'd home with HH ST. PMHx significant of   HTN, CKD stage IIIB, DMT2 on insulin , CAD, chronic smoking, chronic respiratory failure with COPD and on home oxygen , anxiety, HLD. MRI 03/17/23 1. Punctate foci of restricted diffusion in the left posterior  frontal white matter, most likely acute infarcts.  2. Numerous foci of hemosiderin deposition, which are seen in the  deep gray structures but also in the bilateral cerebral hemispheres,  with 1 new focus compared to 02/20/2023. While this could be the  sequela of chronic hypertensive microhemorrhages, this appearance is  concerning for cerebral amyloid angiopathy.  PAIN:  Are you having pain? No  FALLS: defer to PT  LIVING ENVIRONMENT: Lives with: lives with their spouse Lives in: House/apartment  PLOF:  Level of assistance: Independent with ADLs Employment: Retired   PATIENT GOALS    for communication to improve   OBJECTIVE:  TODAY'S TREATMENT: Reviewed publishing rights manager including its rationale/purpose. Pt endorsed using script for conversation with hairstylist with improved satisfaction in conversational exchanges and increased frequency in number of exchanges.  Co-generated script with pt for conversation with pt's sister and dog.  Rehearsed scripts with pt requiring min cues.      PATIENT EDUCATION: Education details: as above Person educated: Patient and Child Education method: Explanation Education comprehension: verbalized understanding and needs further education  HOME EXERCISE PROGRAM:    Practice scripts      GOALS:  Goals reviewed with patient? Yes  SHORT TERM GOALS: Target date: 10 sessions  Pt will participate in further assessment of functional reading/writing. Baseline: Goal status: MET  2.  With Moderate A, patient will complete a semantic feature analysis with at least 2 relevant features for 8/10 target words to improve word-finding skills.  Baseline:  Goal status: PROGRESSING; continue goal on 01/11/24  3.  With Maximal A, patient will generate sentences with 3 or more words in response to a situation at 80% accuracy in order to increase ability to communicate basic wants and needs.  Baseline:  Goal status: PROGRESSING; continue goal on 01/11/24  4.   With Maximal A,  pt will follow 2-step commands for improved participation in ADLs/iADLs with max cueing. Baseline:  Goal status: MET  5.  Pt will repeat short sentences (<4 words) with good approximations 80% of the time with max cueing. Baseline:  Goal status: MET     LONG TERM GOALS: Target date: 4 weeks; 02/08/24  Pt will report a subjective improvement in communication per PROM.  Baseline: CES 12/32 on 8/26 Goal status: INITIAL; continue goal  2.  With Min A, patient/family will demonstrate understanding of the following concepts: aphasia, spontaneous recovery, communication vs conversation, strengths/strategies to promote success, local resources in order to increase patient's participation in medical care.    Baseline:  Goal status: MET  3. Pt and/or daughter will demonstrate understanding of multiple ways to stimulate pt cognitively outside of  ST.   Baseline:  Goal status: INITIAL   ASSESSMENT:  CLINICAL IMPRESSION: Pt is a 78 y.o. female who presents for communication treatment in setting on stroke. Pt known to SLP services and was receiving OP ST prior to most recent stroke 08/05/23. Pt d/c'd home with [facility] ST who is working on securing SGD for pt. PMHx significant of  HTN, CKD stage IIIB, DMT2 on insulin , CAD, chronic smoking, chronic respiratory failure with COPD and on home oxygen , anxiety, HLD. Re-assessment completed 01/10/34 via formal assessment Western Aphasia Battery Revised (WAB-R). 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 Noted improvement in pt's auditory comprehension and repetition; however, difficulty with spontaneous speech and naming/wordfinding persist. Course of therapy, thus far this episode, has be limited by pt's illness with patient missing 8 of 23 scheduled sessions and pt's limited support/cognitive stimulationat home. Recommend continued course of skilled ST on a trial basis for the next 4 weeks with pt in agreement to complete home assignments and attend therapy sessions regularly. See details of tx session above. Recommend course of ST targeting functional communication, further assessment of functional  reading/writing, and pt/caregiver training to help promote QoL and overall life participation.   OBJECTIVE IMPAIRMENTS include expressive language, receptive language, and aphasia. These impairments are limiting patient from ADLs/IADLs and effectively communicating at home and in community. Factors affecting potential to achieve goals and functional outcome are severity of impairments. Patient will benefit from skilled SLP services to address above impairments and improve overall function.  REHAB POTENTIAL: Good  PLAN: SLP FREQUENCY: 2x/week  SLP DURATION: 4 weeks  PLANNED INTERVENTIONS: Language  facilitation, Cueing hierachy, Internal/external aids, Functional tasks, Multimodal communication approach, SLP instruction and feedback, Compensatory strategies, and Patient/family education    Delon Bangs, M.S., CCC-SLP Speech-Language Pathologist [facility] [phone] [place])  [facility] at [facility] [address] [place], [place], [address] Phone: [phone]   Fax:  [phone]

## 2024-02-03 NOTE — Therapy (Signed)
 OUTPATIENT OCCUPATIONAL THERAPY NEURO TREATMENT NOTE  Patient Name: Hannah Kirby MRN: [id number] DOB:01-31-1946, 78 y.o., female Today's Date: 02/03/2024  PCP: Dr. [name] Perry REFERRING PROVIDER: Dr. [name] Perry  END OF SESSION:  OT End of Session - 02/03/24 1920     Visit Number 13    Number of Visits 24    Date for Recertification  04/18/24    OT Start Time 1445    OT Stop Time 1530    OT Time Calculation (min) 45 min    Equipment Utilized During Treatment transport chair    Activity Tolerance Patient tolerated treatment well    Behavior During Therapy WFL for tasks assessed/performed         Past Medical History:  Diagnosis Date   Acute hemorrhagic colitis 08/08/2019   Anemia    C. difficile colitis 08/09/2019   CAD (coronary artery disease)    a. 02/2006 PCI: BMS x 2 to RCA, cath o/w without significant coronary disease; b. nuclear stress test 07/2014: No ischemia/infarct; c. 11/2017 MV: no isch/infarct, EF 55-65%; d. 03/2020 NSTEMI/PCI: LM nl, LAD min irregs, RI 25, small, LCX nl, RCA 30p/m ISR, 99d (3.0x15 Resolute Onyx DES).   Cataract 02/04/2018   Cerebrovascular accident (CVA) (HCC) 03/18/2023   dysarthria   Chronic bronchitis (HCC)    secondary to cigarette smoking   FHx: allergies    Goiter    Granulomatous disease    Hernia    Kidney stone on left side 2013   NSTEMI (non-ST elevated myocardial infarction) (HCC) 03/23/2020   Oxygen  deficiency    Panic attacks    PVC's (premature ventricular contractions)    a. 03/2018 Zio: Occas PVCs (2.5%). Triggered events assoc w/ PVC/PAC.   Retinal detachment, left 03/04/2020   Retinal tear 2020   Stroke (HCC) 10/29/2016   mild left side weakness   Stroke (HCC) 08/01/2019   Tobacco abuse    Past Surgical History:  Procedure Laterality Date   ABDOMINAL HYSTERECTOMY     BREAST SURGERY     CATARACT EXTRACTION W/PHACO Right 03/01/2017   Procedure: CATARACT EXTRACTION PHACO AND INTRAOCULAR LENS PLACEMENT (IOC)  RIGHT DIABETIC;  Surgeon: Mittie Gaskin, MD;  Location: [facility];  Service: Ophthalmology;  Laterality: Right;   CATARACT EXTRACTION W/PHACO Left 03/22/2017   Procedure: CATARACT EXTRACTION PHACO AND INTRAOCULAR LENS PLACEMENT (IOC) LEFT DIABETIC;  Surgeon: Mittie Gaskin, MD;  Location: [facility];  Service: Ophthalmology;  Laterality: Left;  Diabetic - insulin  and oral meds   COLONOSCOPY WITH PROPOFOL  N/A 09/23/2014   Procedure: COLONOSCOPY WITH PROPOFOL ;  Surgeon: Gladis RAYMOND Mariner, MD;  Location: [facility] ENDOSCOPY;  Service: Endoscopy;  Laterality: N/A;   COLONOSCOPY WITH PROPOFOL  N/A 01/11/2018   Procedure: COLONOSCOPY WITH PROPOFOL ;  Surgeon: Mariner Gladis RAYMOND, MD;  Location: [facility] ENDOSCOPY;  Service: Endoscopy;  Laterality: N/A;   COLONOSCOPY WITH PROPOFOL  N/A 04/24/2018   Procedure: COLONOSCOPY WITH PROPOFOL ;  Surgeon: Mariner Gladis RAYMOND, MD;  Location: [facility];  Service: Endoscopy;  Laterality: N/A;   COLONOSCOPY WITH PROPOFOL  N/A 11/19/2019   Procedure: COLONOSCOPY WITH PROPOFOL ;  Surgeon: Jinny Carmine, MD;  Location: [facility] ENDOSCOPY;  Service: Endoscopy;  Laterality: N/A;   CORONARY ANGIOPLASTY WITH STENT PLACEMENT  2008   CORONARY STENT INTERVENTION N/A 03/24/2020   Procedure: CORONARY STENT INTERVENTION;  Surgeon: Mady Bruckner, MD;  Location: [facility];  Service: Cardiovascular;  Laterality: N/A;   ESOPHAGOGASTRODUODENOSCOPY (EGD) WITH PROPOFOL  N/A 12/30/2014   Procedure: ESOPHAGOGASTRODUODENOSCOPY (EGD) WITH PROPOFOL ;  Surgeon: Gladis  RAYMOND Mariner, MD;  Location: [facility] ENDOSCOPY;  Service: Endoscopy;  Laterality: N/A;   ESOPHAGOGASTRODUODENOSCOPY (EGD) WITH PROPOFOL  N/A 07/19/2016   Procedure: ESOPHAGOGASTRODUODENOSCOPY (EGD) WITH PROPOFOL ;  Surgeon: Mariner Gladis RAYMOND, MD;  Location: [facility] ENDOSCOPY;  Service: Endoscopy;  Laterality: N/A;   ESOPHAGOGASTRODUODENOSCOPY (EGD) WITH PROPOFOL  N/A 04/24/2018   Procedure:  ESOPHAGOGASTRODUODENOSCOPY (EGD) WITH PROPOFOL ;  Surgeon: Mariner Gladis RAYMOND, MD;  Location: [facility] ENDOSCOPY;  Service: Endoscopy;  Laterality: N/A;   EYE SURGERY  cataracts, detached retina   hysterectomy (other)     LEFT HEART CATH AND CORONARY ANGIOGRAPHY N/A 03/24/2020   Procedure: LEFT HEART CATH AND CORONARY ANGIOGRAPHY;  Surgeon: Mady Bruckner, MD;  Location: [facility] CV [facility];  Service: Cardiovascular;  Laterality: N/A;   Patient Active Problem List   Diagnosis Date Noted   Stroke (HCC) 08/05/2023   Depression with anxiety 08/05/2023   Overweight (BMI 25.0-29.9) 08/05/2023   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) 03/17/2023   Dizziness 02/23/2023   Restrictive airway disease 07/21/2020   Partial thickness rotator cuff tear 03/20/2020   Shoulder pain 03/05/2020   Ataxia 02/28/2020   Difficulty walking 02/28/2020   Physical deconditioning 02/28/2020   Chronic diastolic congestive heart failure (HCC) 11/20/2019   Polyp of ascending colon    Hypokalemia 08/08/2019   TIA (transient ischemic attack) 08/01/2019   Benign hypertensive kidney disease with chronic kidney disease 04/25/2019   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/24/2018   Chronic, continuous use of opioids 03/26/2018   History of adenomatous polyp of colon 01/22/2018   Diverticulosis of colon 01/22/2018   History of CVA (cerebrovascular accident) 11/08/2016   Weakness of left upper extremity 10/30/2016   AAA (abdominal aortic aneurysm) without rupture 08/12/2016   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   Stage 3b chronic kidney disease (HCC) 07/27/2015   Type 2 diabetes mellitus with diabetic chronic kidney disease (HCC) 10/28/2014   Solitary pulmonary nodule on lung CT 08/20/2014   Allergic rhinitis 07/28/2014   Acid reflux 07/28/2014   Chronic obstructive pulmonary disease (HCC) 07/28/2014   Granuloma annulare    Back pain 11/12/2013   Lumbar scoliosis 10/30/2013   Lumbar  radiculopathy 10/30/2013   Lumbar canal stenosis 10/30/2013   Proteinuria 10/22/2013   Smokes with greater than 30 pack year history 03/21/2011   Edema 08/18/2010   Hyperlipidemia 03/25/2009   Coronary artery disease 03/25/2009   Primary hypertension 03/25/2009   ONSET DATE: 08/05/2023  REFERRING DIAG: M70.101 (ICD-10-CM) - Weakness of left upper extremity   THERAPY DIAG:  Muscle weakness (generalized)  Other lack of coordination  Rationale for Evaluation and Treatment: Rehabilitation  SUBJECTIVE:  SUBJECTIVE STATEMENT: Pt reports she's been doing a little more in the kitchen to heat up food, but activities remain taxing.   Pt accompanied by: family member: daughter, Harrie  PERTINENT HISTORY: Daughter present and provided hx.  Daughter reports pt has had 4 strokes, beginning in 2018, 2019, January of 2025 and the most recent in June of 2025.  Pt had been participating in [facility] since the most recent CVA, and is now eager to transition to outpatient services where PT/OT/SLP have been ordered. Per MEDICAL RECORD NUMBERPMH: of CVA, COPD, Dysarthria, Ataxia, TIA, T2DM, Back pain, scoliosis   PRECAUTIONS: Fall, Loop recorder for Afib  WEIGHT BEARING RESTRICTIONS: No  PAIN:  01/30/24: 3/10 back pain 1Are you having pain? Yes: NPRS scale: 3/10, can get up to 7-8/10 Pain location: low back and L hip Pain description: achy, sharp, dull Aggravating factors: prolonged  standing  Relieving factors: sitting, pain meds, heat/ice   FALLS: Has patient fallen in last 6 months? Yes. Number of falls 1, tripping over cords  LIVING ENVIRONMENT: Lives with: lives with their spouse Lives in: 1 level home Stairs: Yes: External: 1 steps; none Has following equipment at home: walk in shower, comfort height toilet   PLOF: Prior to Jan of this year, pt was managing all home management tasks, was able to drive, and was caring for spouse   PATIENT GOALS: Strengthening the left arm   OBJECTIVE:  Note:  Objective measures were completed at Evaluation unless otherwise noted.  HAND DOMINANCE: Right  ADLs: Overall ADLs: Daughter is pt's primary caregiver and lives near by Transfers/ambulation related to ADLs: occasional help with using walker at home, depending on balance on a given day Eating: indep/able to cut food  Grooming: bimanual grooming without difficulty UB Dressing: extra time for clothing fasteners, but able to gather clothing with RW LB Dressing: extra time with clothing fasteners Toileting: occasional help to walk to the bathroom, but modified indep within the bathroom for toileting tasks Bathing: direct supv for bathing using walkin in shower, shower chair, and grab bar; gets hair washed weekly at salon Tub Shower transfers: supv for walk in shower Equipment: RW, SBQC, shower chair, grab bars  IADLs: Shopping: daughter currently managing Light housekeeping: daughter currently managing; can start laundry on a good day.  Meal Prep: starting to participate in light meal prep, including making eggs on stove top Community mobility: daughter assists with all transportation; pt using transport chair to reach therapy gym today Medication management: daughter sets up pills 2 weeks at a time  Financial management: pt is participating in bill paying with daughter's assistance; most are online Handwriting: 100% legible  POSTURE COMMENTS:  rounded shoulders, forward head, increased thoracic kyphosis  ACTIVITY TOLERANCE: Activity tolerance: to be further assessed within functional contexts  FUNCTIONAL OUTCOME MEASURES: TBD  UPPER EXTREMITY ROM:  BUEs WFL for daily tasks  UPPER EXTREMITY MMT:  Hx of R rotator cuff tear 4-5 years ago with residual weakness, non-surgical;    MMT Right eval Right 01/03/24 Left eval Left 01/03/24  Shoulder flexion 3+ 3+ 4 4  Shoulder abduction 3+ 3+ 4 4  Shoulder adduction      Shoulder extension      Shoulder internal rotation 3+ 3+ 4- 4   Shoulder external rotation 3+ 3+ 3+ 4-  Elbow flexion 5 5 5 5   Elbow extension 5 5 5 5   Wrist flexion 4+ 5 5 5   Wrist extension 4+ 5 5 5   Wrist ulnar deviation      Wrist radial deviation      Wrist pronation      Wrist supination      (Blank rows = not tested)   *Most recent CVA affecting L non-dominant side, with LUE presenting with increased strength as compared to dominant/unaffected arm.  Assume RUE weaker from hx of rotator cuff tear, or possibly previous CVAs.  Pt does present with mild LUE ataxia, and bilat hand weakness.  HAND FUNCTION: Grip strength: Right: 32 lbs; Left: 30 lbs, Lateral pinch: Right: 8 lbs, Left: 6 lbs, and 3 point pinch: Right: 7 lbs, Left: 6 lbs Grip strength: Right: 40 lbs; Left: 30 lbs, Lateral pinch: Right: 16 lbs, Left: 12; 3 point pinch: Right: 18 lbs, Left: 13 lbs  01/25/24: Right: 35 lbs; Left: 30 lbs, Lateral pinch: Right 7 lbs, Left: 5 lbs, and 3 point pinch: Right:  7 lbs, Left: 4 lbs   COORDINATION: Finger Nose Finger test: LUE mild ataxia  9 Hole Peg test: Right: 29 sec; Left: 47 sec 01/03/24: Right: 27 sec, Left: 37 sec  01/25/24: Right 27 sec, Left: 35 sec   SENSATION: Pt reports intermittent numbness in the R thumb, IF, and LF  EDEMA: No visible edema  MUSCLE TONE: RUE: Within functional limits and LUE: Within functional limits  COGNITION: Overall cognitive status: Impaired; see SLP eval for details  VISION: Pt reports L eye feels more blurry since first stroke in 2018; pt has reading glasses but doesn't usually wear them   VISION ASSESSMENT: To be further assessed in functional context Tracking/Visual pursuits: Able to track stimulus in all quads without difficulty Saccades: WFL Visual Fields: no apparent deficits Clock drawing completed: all numbers spaced evenly, but pt left out the 1 and verbalized not knowing how to draw ten minutes to eleven when this direction was given.  Patient has difficulty with following activities due  to following visual impairments: To be further assessed  PERCEPTION: To be further assessed  PRAXIS: Impaired: Motor planning  OBSERVATIONS: Pt pleasant, cooperative.  Daughter present and very supportive in pt's care.                                                                                                                       TREATMENT DATE:01/30/24 Self Care: -Simulated laundry tasks using rollator for stability while reaching for linens in closet and moving items from 1 surface to another.   -Practiced functional reaching with and without reacher, seated and standing with 1 hand support on rollator; SBA and min vc for walker positioning. -Review of EC strategies and fall prevention using reacher and rollator, including min vc for locking brakes, safe walker positioning when reaching for items, and backing rollator up against wall or countertop when able when choosing to sit on rollator seat for better walker stability during STS from rollator seat.  Practiced STS trials without walker backed up against wall, requiring vc for anterior WS to minimize movement of rollator during ascent.    -Practiced item transport with cup of water to simulate meal set up and watering plants; SBA and min vc for item transfer techniques.   PATIENT EDUCATION: Education details: EC, fall prevention, rollator safety Person educated: Patient and Child(ren) Education method: Explanation and Verbal cues Education comprehension: verbalized understanding  HOME EXERCISE PROGRAM: Yellow theraputty  GOALS: Goals reviewed with patient? Yes  SHORT TERM GOALS: Target date: 03/07/24  Pt will perform HEP with min vc or less for improving LUE coordination and bilat hand strength. Baseline: Eval: HEP not yet initiated; 01/03/24: Pt acknowledges minimal participation in HEP in the home, but does have HEP in place with putty and coordination training activities; 01/25/24: see 01/03/24 Goal status: d/c/ not  achieved  2.  Pt will be indep to verbalize and implement 2-3 fall prevention measures to reduce fall risk with ADLs. Baseline: Eval: Educ not yet initiated;  01/03/24: 1 fall last week after reaching to floor level to pick up soiled dog pad while dizzy; pt able to verbalize 3 fall prevention strategies with min vc; 01/25/24: pt uses countertop for support while bending/reaching/ambulating in kitchen, but is inconsistent with walker in the home   Goal status: in progress  LONG TERM GOALS: Target date: 04/18/24  Pt will perform shower transfers with modified indep Baseline: Eval: Direct supv; 01/03/24: direct supv from spouse, which pt plans to continue Goal status: d/c  2.  Pt will improve LUE GMC to enable reaching with good accuracy into overhead kitchen cabinets. Baseline: Eval: Mild LUE ataxia; 01/03/24: Residual mild LUE ataxia and pt denies attempts at reaching into kitchen cupboards d/t family managing meals; 01/25/24: Demonstrated today with good accuracy  Goal status: achieved  3.  Pt will improve L hand [facility] skills as demonstrated by 10 sec or more improvement in 9 hole peg test for improved efficiency when manipulating  clothing fasteners. Baseline: Eval: L 9 hole peg test: L 47 sec, R 29 sec; 01/03/24: L 37 sec, R 27 sec, indep with clothing fasteners Goal status: achieved  4.  Pt will manage light loads of laundry with modified indep. Baseline: Eval: Daughter mostly manages, but pt can start laundry on a good day; 01/03/24: Pt can start laundry, and has been given recommendations for safe reaching strategies and item transport for increasing indep with this task, but has not yet attempted these strategies at home.  Pt continues to rely on family to manage laundry tasks; 01/25/24: Same as 01/03/24 as daughter reports she has not yet implemented recommendations for de-cluttering laundry room to allow space for a chair  Goal status: in progress  5. Pt will perform hot or cold light  meal prep with distant supv and RW or rollator for at least 1 meal per day with reported RPE of 6 or less on modified Borg scale. Baseline: 01/03/24: Family currently manages meal preparation; 01/25/24: Pt has started to use microwave and occasionally stove top to reheat food, but verbalizes taxing effort; RPE 7/10 (vigorous activity) on modified Borg scale.   Goal status: Revised on 01/25/24      6.  Pt will verbalize and implement 1-2 EC strategies with min vc or less to enable light sweeping and mopping of kitchen floor with reported RPE of 6 or less on modified Borg scale.      Baseline: 01/25/24: RPE 7-8.  Often relies on daughter.     Goal status: New  ASSESSMENT: CLINICAL IMPRESSION: Min vc for rollator safety, EC strategies, fall prevention, and safe functional reaching strategies, requiring SBA and rollator for support.  02 sats remained low to mid 90s throughout, given frequent seated rest breaks and occasional mod vc and demo for PLB technique.  Pt reports RPE of ~6 with use of rollator and frequent rest breaks to perform simulated IADLs.  OT has encouraged placement of reacher within a couple rooms of home for increased accessibility.  Pt continues to benefit from skilled OT to maximize indep, safety, and efficiency with IADL tasks.     PERFORMANCE DEFICITS: in functional skills including ADLs, IADLs, coordination, dexterity, sensation, ROM, strength, pain, Fine motor control, Gross motor control, mobility, balance, body mechanics, endurance, decreased knowledge of precautions, decreased knowledge of use of DME, vision, and UE functional use, cognitive skills including emotional, perception, problem solving, safety awareness, temperament/personality, and understand, and psychosocial skills including coping strategies, environmental adaptation, and routines and behaviors.   IMPAIRMENTS:  are limiting patient from ADLs, IADLs, leisure, and social participation.   CO-MORBIDITIES: has  co-morbidities such as afib, AAA, hx of multiple CVAs that affects occupational performance. Patient will benefit from skilled OT to address above impairments and improve overall function.  MODIFICATION OR ASSISTANCE TO COMPLETE EVALUATION: Min-Moderate modification of tasks or assist with assess necessary to complete an evaluation.  OT OCCUPATIONAL PROFILE AND HISTORY: Detailed assessment: Review of records and additional review of physical, cognitive, psychosocial history related to current functional performance.  CLINICAL DECISION MAKING: Moderate - several treatment options, min-mod task modification necessary  REHAB POTENTIAL: Good  EVALUATION COMPLEXITY: Moderate    PLAN:  OT FREQUENCY: 2x/week  OT DURATION: 12 weeks  PLANNED INTERVENTIONS: 97168 OT Re-evaluation, 97535 self care/ADL training, 02889 therapeutic exercise, 97530 therapeutic activity, 97112 neuromuscular re-education, 97140 manual therapy, 97116 gait training, 02989 moist heat, 97010 cryotherapy, 97129 Cognitive training (first 15 min), 02249 Physical Performance Testing, balance training, functional mobility training, visual/perceptual remediation/compensation, psychosocial skills training, energy conservation, coping strategies training, patient/family education, and DME and/or AE instructions  RECOMMENDED OTHER SERVICES: none at this time (OT/PT/SLP services have all been ordered)  CONSULTED AND AGREED WITH PLAN OF CARE: Patient and family member/caregiver  PLAN FOR NEXT SESSION: see above  Inocente Blazing, MS, OTR/L  02/03/2024, 7:22 PM

## 2024-02-03 NOTE — Therapy (Signed)
 OUTPATIENT OCCUPATIONAL THERAPY NEURO TREATMENT NOTE  Patient Name: Hannah Kirby MRN: [id number] DOB:03-04-1945, 78 y.o., female Today's Date: 02/03/2024  PCP: Dr. [name] Perry REFERRING PROVIDER: Dr. [name] Perry  END OF SESSION:  OT End of Session - 02/03/24 1949     Visit Number 14    Number of Visits 24    Date for Recertification  04/18/24    OT Start Time 1400    OT Stop Time 1445    OT Time Calculation (min) 45 min    Equipment Utilized During Treatment transport chair    Activity Tolerance Patient tolerated treatment well    Behavior During Therapy WFL for tasks assessed/performed         Past Medical History:  Diagnosis Date   Acute hemorrhagic colitis 08/08/2019   Anemia    C. difficile colitis 08/09/2019   CAD (coronary artery disease)    a. 02/2006 PCI: BMS x 2 to RCA, cath o/w without significant coronary disease; b. nuclear stress test 07/2014: No ischemia/infarct; c. 11/2017 MV: no isch/infarct, EF 55-65%; d. 03/2020 NSTEMI/PCI: LM nl, LAD min irregs, RI 25, small, LCX nl, RCA 30p/m ISR, 99d (3.0x15 Resolute Onyx DES).   Cataract 02/04/2018   Cerebrovascular accident (CVA) (HCC) 03/18/2023   dysarthria   Chronic bronchitis (HCC)    secondary to cigarette smoking   FHx: allergies    Goiter    Granulomatous disease    Hernia    Kidney stone on left side 2013   NSTEMI (non-ST elevated myocardial infarction) (HCC) 03/23/2020   Oxygen  deficiency    Panic attacks    PVC's (premature ventricular contractions)    a. 03/2018 Zio: Occas PVCs (2.5%). Triggered events assoc w/ PVC/PAC.   Retinal detachment, left 03/04/2020   Retinal tear 2020   Stroke (HCC) 10/29/2016   mild left side weakness   Stroke (HCC) 08/01/2019   Tobacco abuse    Past Surgical History:  Procedure Laterality Date   ABDOMINAL HYSTERECTOMY     BREAST SURGERY     CATARACT EXTRACTION W/PHACO Right 03/01/2017   Procedure: CATARACT EXTRACTION PHACO AND INTRAOCULAR LENS PLACEMENT (IOC)  RIGHT DIABETIC;  Surgeon: Mittie Gaskin, MD;  Location: [facility];  Service: Ophthalmology;  Laterality: Right;   CATARACT EXTRACTION W/PHACO Left 03/22/2017   Procedure: CATARACT EXTRACTION PHACO AND INTRAOCULAR LENS PLACEMENT (IOC) LEFT DIABETIC;  Surgeon: Mittie Gaskin, MD;  Location: [facility];  Service: Ophthalmology;  Laterality: Left;  Diabetic - insulin  and oral meds   COLONOSCOPY WITH PROPOFOL  N/A 09/23/2014   Procedure: COLONOSCOPY WITH PROPOFOL ;  Surgeon: Gladis RAYMOND Mariner, MD;  Location: [facility] END[facility]COPY;  Service: Endoscopy;  Laterality: N/A;   COLONOSCOPY WITH PROPOFOL  N/A 01/11/2018   Procedure: COLONOSCOPY WITH PROPOFOL ;  Surgeon: Mariner Gladis RAYMOND, MD;  Location: [facility];  Service: Endoscopy;  Laterality: N/A;   COLONOSCOPY WITH PROPOFOL  N/A 04/24/2018   Procedure: COLONOSCOPY WITH PROPOFOL ;  Surgeon: Mariner Gladis RAYMOND, MD;  Location: [facility] ENDOSCOPY;  Service: Endoscopy;  Laterality: N/A;   COLONOSCOPY WITH PROPOFOL  N/A 11/19/2019   Procedure: COLONOSCOPY WITH PROPOFOL ;  Surgeon: Jinny Carmine, MD;  Location: [facility] ENDOSCOPY;  Service: Endoscopy;  Laterality: N/A;   CORONARY ANGIOPLASTY WITH STENT PLACEMENT  2008   CORONARY STENT INTERVENTION N/A 03/24/2020   Procedure: CORONARY STENT INTERVENTION;  Surgeon: Mady Bruckner, MD;  Location: [facility] INVASIVE CV LAB;  Service: Cardiovascular;  Laterality: N/A;   ESOPHAGOGASTRODUODENOSCOPY (EGD) WITH PROPOFOL  N/A 12/30/2014   Procedure: ESOPHAGOGASTRODUODENOSCOPY (EGD) WITH PROPOFOL ;  Surgeon: Gladis  RAYMOND Mariner, MD;  Location: [facility] ENDOSCOPY;  Service: Endoscopy;  Laterality: N/A;   ESOPHAGOGASTRODUODENOSCOPY (EGD) WITH PROPOFOL  N/A 07/19/2016   Procedure: ESOPHAGOGASTRODUODENOSCOPY (EGD) WITH PROPOFOL ;  Surgeon: Mariner Gladis RAYMOND, MD;  Location: [facility] ENDOSCOPY;  Service: Endoscopy;  Laterality: N/A;   ESOPHAGOGASTRODUODENOSCOPY (EGD) WITH PROPOFOL  N/A 04/24/2018   Procedure:  ESOPHAGOGASTRODUODENOSCOPY (EGD) WITH PROPOFOL ;  Surgeon: Mariner Gladis RAYMOND, MD;  Location: [facility] ENDOSCOPY;  Service: Endoscopy;  Laterality: N/A;   EYE SURGERY  cataracts, detached retina   hysterectomy (other)     LEFT HEART CATH AND CORONARY ANGIOGRAPHY N/A 03/24/2020   Procedure: LEFT HEART CATH AND CORONARY ANGIOGRAPHY;  Surgeon: Mady Bruckner, MD;  Location: [facility] CV LAB;  Service: Cardiovascular;  Laterality: N/A;   Patient Active Problem List   Diagnosis Date Noted   Stroke (HCC) 08/05/2023   Depression with anxiety 08/05/2023   Overweight (BMI 25.0-29.9) 08/05/2023   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) 03/17/2023   Dizziness 02/23/2023   Restrictive airway disease 07/21/2020   Partial thickness rotator cuff tear 03/20/2020   Shoulder pain 03/05/2020   Ataxia 02/28/2020   Difficulty walking 02/28/2020   Physical deconditioning 02/28/2020   Chronic diastolic congestive heart failure (HCC) 11/20/2019   Polyp of ascending colon    Hypokalemia 08/08/2019   TIA (transient ischemic attack) 08/01/2019   Benign hypertensive kidney disease with chronic kidney disease 04/25/2019   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/24/2018   Chronic, continuous use of opioids 03/26/2018   History of adenomatous polyp of colon 01/22/2018   Diverticulosis of colon 01/22/2018   History of CVA (cerebrovascular accident) 11/08/2016   Weakness of left upper extremity 10/30/2016   AAA (abdominal aortic aneurysm) without rupture 08/12/2016   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   Stage 3b chronic kidney disease (HCC) 07/27/2015   Type 2 diabetes mellitus with diabetic chronic kidney disease (HCC) 10/28/2014   Solitary pulmonary nodule on lung CT 08/20/2014   Allergic rhinitis 07/28/2014   Acid reflux 07/28/2014   Chronic obstructive pulmonary disease (HCC) 07/28/2014   Granuloma annulare    Back pain 11/12/2013   Lumbar scoliosis 10/30/2013   Lumbar  radiculopathy 10/30/2013   Lumbar canal stenosis 10/30/2013   Proteinuria 10/22/2013   Smokes with greater than 30 pack year history 03/21/2011   Edema 08/18/2010   Hyperlipidemia 03/25/2009   Coronary artery disease 03/25/2009   Primary hypertension 03/25/2009   ONSET DATE: 08/05/2023  REFERRING DIAG: M70.101 (ICD-10-CM) - Weakness of left upper extremity   THERAPY DIAG:  Muscle weakness (generalized)  Other lack of coordination  Rationale for Evaluation and Treatment: Rehabilitation  SUBJECTIVE:  SUBJECTIVE STATEMENT: Pt reports not feeling as well today, possibly coming down with a cold.    Pt accompanied by: family member: daughter, Harrie  PERTINENT HISTORY: Daughter present and provided hx.  Daughter reports pt has had 4 strokes, beginning in 2018, 2019, January of 2025 and the most recent in June of 2025.  Pt had been participating in [facility] since the most recent CVA, and is now eager to transition to outpatient services where PT/OT/SLP have been ordered. Per MEDICAL RECORD NUMBERPMH: of CVA, COPD, Dysarthria, Ataxia, TIA, T2DM, Back pain, scoliosis   PRECAUTIONS: Fall, Loop recorder for Afib  WEIGHT BEARING RESTRICTIONS: No  PAIN:  02/01/24: 3/10 back pain 1Are you having pain? Yes: NPRS scale: 3/10, can get up to 7-8/10 Pain location: low back and L hip Pain description: achy, sharp, dull Aggravating factors: prolonged standing  Relieving factors: sitting,  pain meds, heat/ice   FALLS: Has patient fallen in last 6 months? Yes. Number of falls 1, tripping over cords  LIVING ENVIRONMENT: Lives with: lives with their spouse Lives in: 1 level home Stairs: Yes: External: 1 steps; none Has following equipment at home: walk in shower, comfort height toilet   PLOF: Prior to Jan of this year, pt was managing all home management tasks, was able to drive, and was caring for spouse   PATIENT GOALS: Strengthening the left arm   OBJECTIVE:  Note: Objective measures were completed  at Evaluation unless otherwise noted.  HAND DOMINANCE: Right  ADLs: Overall ADLs: Daughter is pt's primary caregiver and lives near by Transfers/ambulation related to ADLs: occasional help with using walker at home, depending on balance on a given day Eating: indep/able to cut food  Grooming: bimanual grooming without difficulty UB Dressing: extra time for clothing fasteners, but able to gather clothing with RW LB Dressing: extra time with clothing fasteners Toileting: occasional help to walk to the bathroom, but modified indep within the bathroom for toileting tasks Bathing: direct supv for bathing using walkin in shower, shower chair, and grab bar; gets hair washed weekly at salon Tub Shower transfers: supv for walk in shower Equipment: RW, SBQC, shower chair, grab bars  IADLs: Shopping: daughter currently managing Light housekeeping: daughter currently managing; can start laundry on a good day.  Meal Prep: starting to participate in light meal prep, including making eggs on stove top Community mobility: daughter assists with all transportation; pt using transport chair to reach therapy gym today Medication management: daughter sets up pills 2 weeks at a time  Financial management: pt is participating in bill paying with daughter's assistance; most are online Handwriting: 100% legible  POSTURE COMMENTS:  rounded shoulders, forward head, increased thoracic kyphosis  ACTIVITY TOLERANCE: Activity tolerance: to be further assessed within functional contexts  FUNCTIONAL OUTCOME MEASURES: TBD  UPPER EXTREMITY ROM:  BUEs WFL for daily tasks  UPPER EXTREMITY MMT:  Hx of R rotator cuff tear 4-5 years ago with residual weakness, non-surgical;    MMT Right eval Right 01/03/24 Left eval Left 01/03/24  Shoulder flexion 3+ 3+ 4 4  Shoulder abduction 3+ 3+ 4 4  Shoulder adduction      Shoulder extension      Shoulder internal rotation 3+ 3+ 4- 4  Shoulder external rotation 3+ 3+  3+ 4-  Elbow flexion 5 5 5 5   Elbow extension 5 5 5 5   Wrist flexion 4+ 5 5 5   Wrist extension 4+ 5 5 5   Wrist ulnar deviation      Wrist radial deviation      Wrist pronation      Wrist supination      (Blank rows = not tested)   *Most recent CVA affecting L non-dominant side, with LUE presenting with increased strength as compared to dominant/unaffected arm.  Assume RUE weaker from hx of rotator cuff tear, or possibly previous CVAs.  Pt does present with mild LUE ataxia, and bilat hand weakness.  HAND FUNCTION: Grip strength: Right: 32 lbs; Left: 30 lbs, Lateral pinch: Right: 8 lbs, Left: 6 lbs, and 3 point pinch: Right: 7 lbs, Left: 6 lbs Grip strength: Right: 40 lbs; Left: 30 lbs, Lateral pinch: Right: 16 lbs, Left: 12; 3 point pinch: Right: 18 lbs, Left: 13 lbs  01/25/24: Right: 35 lbs; Left: 30 lbs, Lateral pinch: Right 7 lbs, Left: 5 lbs, and 3 point pinch: Right: 7 lbs, Left: 4 lbs  COORDINATION: Finger Nose Finger test: LUE mild ataxia  9 Hole Peg test: Right: 29 sec; Left: 47 sec 01/03/24: Right: 27 sec, Left: 37 sec  01/25/24: Right 27 sec, Left: 35 sec   SENSATION: Pt reports intermittent numbness in the R thumb, IF, and LF  EDEMA: No visible edema  MUSCLE TONE: RUE: Within functional limits and LUE: Within functional limits  COGNITION: Overall cognitive status: Impaired; see SLP eval for details  VISION: Pt reports L eye feels more blurry since first stroke in 2018; pt has reading glasses but doesn't usually wear them   VISION ASSESSMENT: To be further assessed in functional context Tracking/Visual pursuits: Able to track stimulus in all quads without difficulty Saccades: WFL Visual Fields: no apparent deficits Clock drawing completed: all numbers spaced evenly, but pt left out the 1 and verbalized not knowing how to draw ten minutes to eleven when this direction was given.  Patient has difficulty with following activities due to following visual impairments: To  be further assessed  PERCEPTION: To be further assessed  PRAXIS: Impaired: Motor planning  OBSERVATIONS: Pt pleasant, cooperative.  Daughter present and very supportive in pt's care.                                                                                                                       TREATMENT DATE:01/30/24 Self Care: -Simulated light housekeeping cleaning up liquid spill on floor; min vc for using reacher with a towel and/or sitting on walker seat for EC and reducing fall prevention -Additional IADL simulation practiced with functional mobility with rollator, item transport, and functional reaching, making small turns in place, locking brakes to reduce fall risk. -Performed STS from rollator seat with minimal movement of rollator during ascent when rollator is not able to be backed up to wall or countertop; min vc for transfer technique.   -Written handout issued and reviewed for basic EC principles, including 4 Ps of EC (planning, prioritizing, pacing, positioning).  OT provided examples of each P within daily tasks, and additional strategies focused on IADLs, including use of long handled cleaning tools, allowing frequent rest breaks, delegating tasks.     PATIENT EDUCATION: Education details: EC Person educated: Patient and Child(ren) Education method: Explanation and Verbal cues Education comprehension: verbalized understanding  HOME EXERCISE PROGRAM: Yellow theraputty  GOALS: Goals reviewed with patient? Yes  SHORT TERM GOALS: Target date: 03/07/24  Pt will perform HEP with min vc or less for improving LUE coordination and bilat hand strength. Baseline: Eval: HEP not yet initiated; 01/03/24: Pt acknowledges minimal participation in HEP in the home, but does have HEP in place with putty and coordination training activities; 01/25/24: see 01/03/24 Goal status: d/c/ not achieved  2.  Pt will be indep to verbalize and implement 2-3 fall prevention measures to reduce  fall risk with ADLs. Baseline: Eval: Educ not yet initiated; 01/03/24: 1 fall last week after reaching to floor level to pick up soiled dog pad while dizzy; pt able to  verbalize 3 fall prevention strategies with min vc; 01/25/24: pt uses countertop for support while bending/reaching/ambulating in kitchen, but is inconsistent with walker in the home   Goal status: in progress  LONG TERM GOALS: Target date: 04/18/24  Pt will perform shower transfers with modified indep Baseline: Eval: Direct supv; 01/03/24: direct supv from spouse, which pt plans to continue Goal status: d/c  2.  Pt will improve LUE GMC to enable reaching with good accuracy into overhead kitchen cabinets. Baseline: Eval: Mild LUE ataxia; 01/03/24: Residual mild LUE ataxia and pt denies attempts at reaching into kitchen cupboards d/t family managing meals; 01/25/24: Demonstrated today with good accuracy  Goal status: achieved  3.  Pt will improve L hand [facility] skills as demonstrated by 10 sec or more improvement in 9 hole peg test for improved efficiency when manipulating  clothing fasteners. Baseline: Eval: L 9 hole peg test: L 47 sec, R 29 sec; 01/03/24: L 37 sec, R 27 sec, indep with clothing fasteners Goal status: achieved  4.  Pt will manage light loads of laundry with modified indep. Baseline: Eval: Daughter mostly manages, but pt can start laundry on a good day; 01/03/24: Pt can start laundry, and has been given recommendations for safe reaching strategies and item transport for increasing indep with this task, but has not yet attempted these strategies at home.  Pt continues to rely on family to manage laundry tasks; 01/25/24: Same as 01/03/24 as daughter reports she has not yet implemented recommendations for de-cluttering laundry room to allow space for a chair  Goal status: in progress  5. Pt will perform hot or cold light meal prep with distant supv and RW or rollator for at least 1 meal per day with reported RPE of 6 or  less on modified Borg scale. Baseline: 01/03/24: Family currently manages meal preparation; 01/25/24: Pt has started to use microwave and occasionally stove top to reheat food, but verbalizes taxing effort; RPE 7/10 (vigorous activity) on modified Borg scale.   Goal status: Revised on 01/25/24      6.  Pt will verbalize and implement 1-2 EC strategies with min vc or less to enable light sweeping and mopping of kitchen floor with reported RPE of 6 or less on modified Borg scale.      Baseline: 01/25/24: RPE 7-8.  Often relies on daughter.     Goal status: New  ASSESSMENT: CLINICAL IMPRESSION: Activity tolerance slightly lower today than 2 previous visits, with pt reporting she may be coming down with a cold.  02 sats briefly dropped to 89% on room air during simulated IADLs this date, with quick return to >90% during guided PLB, with pt requiring min vc to initiate and perform with accurate technique.  Otherwise, 02 sats remained low-mid 90s during functional mobility using rollator.  Pt is consistently demonstrating ability to pick up ADL supplies from standing position using rollator for support, or seated position with or without reacher, requiring occasional min vc for walker positioning and SBA.  Pt shows improved rollator safety with consistency to lock brakes without prompting, though still occasional min vc for STS technique from rollator seat to ensure walker does not move when walker is unable to be positioned against a wall or countertop.  Pt verbalized understanding of EC strategies specific to IADLs; will continue to review and assess carry over at home based on pt and daughter's report.  Daughter plans to move pt's rollator to kitchen, as rollator is not currently being used at  home.  Rollator has been recommended for purposes of EC during IADLs, and for ease of item transport.  Pt continues to benefit from skilled OT to maximize indep, safety, and efficiency with IADL tasks.     PERFORMANCE  DEFICITS: in functional skills including ADLs, IADLs, coordination, dexterity, sensation, ROM, strength, pain, Fine motor control, Gross motor control, mobility, balance, body mechanics, endurance, decreased knowledge of precautions, decreased knowledge of use of DME, vision, and UE functional use, cognitive skills including emotional, perception, problem solving, safety awareness, temperament/personality, and understand, and psychosocial skills including coping strategies, environmental adaptation, and routines and behaviors.   IMPAIRMENTS: are limiting patient from ADLs, IADLs, leisure, and social participation.   CO-MORBIDITIES: has co-morbidities such as afib, AAA, hx of multiple CVAs that affects occupational performance. Patient will benefit from skilled OT to address above impairments and improve overall function.  MODIFICATION OR ASSISTANCE TO COMPLETE EVALUATION: Min-Moderate modification of tasks or assist with assess necessary to complete an evaluation.  OT OCCUPATIONAL PROFILE AND HISTORY: Detailed assessment: Review of records and additional review of physical, cognitive, psychosocial history related to current functional performance.  CLINICAL DECISION MAKING: Moderate - several treatment options, min-mod task modification necessary  REHAB POTENTIAL: Good  EVALUATION COMPLEXITY: Moderate    PLAN:  OT FREQUENCY: 2x/week  OT DURATION: 12 weeks  PLANNED INTERVENTIONS: 97168 OT Re-evaluation, 97535 self care/ADL training, 02889 therapeutic exercise, 97530 therapeutic activity, 97112 neuromuscular re-education, 97140 manual therapy, 97116 gait training, 02989 moist heat, 97010 cryotherapy, 97129 Cognitive training (first 15 min), 02249 Physical Performance Testing, balance training, functional mobility training, visual/perceptual remediation/compensation, psychosocial skills training, energy conservation, coping strategies training, patient/family education, and DME and/or AE  instructions  RECOMMENDED OTHER SERVICES: none at this time (OT/PT/SLP services have all been ordered)  CONSULTED AND AGREED WITH PLAN OF CARE: Patient and family member/caregiver  PLAN FOR NEXT SESSION: see above  Inocente Blazing, MS, OTR/L  02/03/2024, 7:50 PM

## 2024-02-06 ENCOUNTER — Ambulatory Visit

## 2024-02-06 ENCOUNTER — Ambulatory Visit: Admitting: Physical Therapy

## 2024-02-07 ENCOUNTER — Telehealth: Payer: Self-pay | Admitting: Acute Care

## 2024-02-07 DIAGNOSIS — I714 Abdominal aortic aneurysm, without rupture, unspecified: Secondary | ICD-10-CM

## 2024-02-07 NOTE — Telephone Encounter (Signed)
 I have attempted to call the patient with the results of their low dose CT. There was no answer. I left a HIPPA compliant message on their VM with the office contact number requesting that they call [phone] to review the results of the scan.   Pt has a normal LCSC. 12 month annual follow up scan. Please fax results to PCP.  She has a new Aortic Aneurysm that was not there 12 months ago.It is 4.7 cm. She needs referral to Thoracic surgery once we get in touch with her. Once we have touched base with her, let me know and I can place the referral. Thanks so much.  Dr. Gasper, I will refer to [facility] once she has been notified. Thanks

## 2024-02-08 ENCOUNTER — Ambulatory Visit

## 2024-02-08 ENCOUNTER — Ambulatory Visit: Admitting: Physical Therapy

## 2024-02-08 DIAGNOSIS — R278 Other lack of coordination: Secondary | ICD-10-CM

## 2024-02-08 DIAGNOSIS — R2681 Unsteadiness on feet: Secondary | ICD-10-CM

## 2024-02-08 DIAGNOSIS — M6281 Muscle weakness (generalized): Secondary | ICD-10-CM

## 2024-02-08 DIAGNOSIS — R262 Difficulty in walking, not elsewhere classified: Secondary | ICD-10-CM

## 2024-02-08 DIAGNOSIS — R2689 Other abnormalities of gait and mobility: Secondary | ICD-10-CM

## 2024-02-08 DIAGNOSIS — R269 Unspecified abnormalities of gait and mobility: Secondary | ICD-10-CM

## 2024-02-08 NOTE — Therapy (Signed)
 OUTPATIENT PHYSICAL THERAPY NEURO TREATMENT   Patient Name: Hannah Kirby MRN: [id number] DOB:03-Oct-1945, 78 y.o., female Today's Date: 02/08/2024   PCP: Gasper [name] BRAVO, MD REFERRING PROVIDER: Gasper [name] BRAVO, MD  END OF SESSION:    PT End of Session - 02/08/24 1323     Visit Number 18    Number of Visits 39    Date for Recertification  04/18/24    Progress Note Due on Visit 20    PT Start Time 1315    PT Stop Time 1355    PT Time Calculation (min) 40 min    Equipment Utilized During Treatment Gait belt    Activity Tolerance Patient tolerated treatment well    Behavior During Therapy WFL for tasks assessed/performed           Past Medical History:  Diagnosis Date   Acute hemorrhagic colitis 08/08/2019   Anemia    C. difficile colitis 08/09/2019   CAD (coronary artery disease)    a. 02/2006 PCI: BMS x 2 to RCA, cath o/w without significant coronary disease; b. nuclear stress test 07/2014: No ischemia/infarct; c. 11/2017 MV: no isch/infarct, EF 55-65%; d. 03/2020 NSTEMI/PCI: LM nl, LAD min irregs, RI 25, small, LCX nl, RCA 30p/m ISR, 99d (3.0x15 Resolute Onyx DES).   Cataract 02/04/2018   Cerebrovascular accident (CVA) (HCC) 03/18/2023   dysarthria   Chronic bronchitis (HCC)    secondary to cigarette smoking   FHx: allergies    Goiter    Granulomatous disease    Hernia    Kidney stone on left side 2013   NSTEMI (non-ST elevated myocardial infarction) (HCC) 03/23/2020   Oxygen  deficiency    Panic attacks    PVC's (premature ventricular contractions)    a. 03/2018 Zio: Occas PVCs (2.5%). Triggered events assoc w/ PVC/PAC.   Retinal detachment, left 03/04/2020   Retinal tear 2020   Stroke (HCC) 10/29/2016   mild left side weakness   Stroke (HCC) 08/01/2019   Tobacco abuse    Past Surgical History:  Procedure Laterality Date   ABDOMINAL HYSTERECTOMY     BREAST SURGERY     CATARACT EXTRACTION W/PHACO Right 03/01/2017   Procedure: CATARACT EXTRACTION PHACO  AND INTRAOCULAR LENS PLACEMENT (IOC) RIGHT DIABETIC;  Surgeon: Mittie Gaskin, MD;  Location: [facility];  Service: Ophthalmology;  Laterality: Right;   CATARACT EXTRACTION W/PHACO Left 03/22/2017   Procedure: CATARACT EXTRACTION PHACO AND INTRAOCULAR LENS PLACEMENT (IOC) LEFT DIABETIC;  Surgeon: Mittie Gaskin, MD;  Location: [facility];  Service: Ophthalmology;  Laterality: Left;  Diabetic - insulin  and oral meds   COLONOSCOPY WITH PROPOFOL  N/A 09/23/2014   Procedure: COLONOSCOPY WITH PROPOFOL ;  Surgeon: Gladis RAYMOND Mariner, MD;  Location: [facility];  Service: Endoscopy;  Laterality: N/A;   COLONOSCOPY WITH PROPOFOL  N/A 01/11/2018   Procedure: COLONOSCOPY WITH PROPOFOL ;  Surgeon: Mariner Gladis RAYMOND, MD;  Location: [facility] ENDOSCOPY;  Service: Endoscopy;  Laterality: N/A;   COLONOSCOPY WITH PROPOFOL  N/A 04/24/2018   Procedure: COLONOSCOPY WITH PROPOFOL ;  Surgeon: Mariner Gladis RAYMOND, MD;  Location: [facility] ENDOSCOPY;  Service: Endoscopy;  Laterality: N/A;   COLONOSCOPY WITH PROPOFOL  N/A 11/19/2019   Procedure: COLONOSCOPY WITH PROPOFOL ;  Surgeon: Jinny Carmine, MD;  Location: [facility] ENDOSCOPY;  Service: Endoscopy;  Laterality: N/A;   CORONARY ANGIOPLASTY WITH STENT PLACEMENT  2008   CORONARY STENT INTERVENTION N/A 03/24/2020   Procedure: CORONARY STENT INTERVENTION;  Surgeon: Mady Bruckner, MD;  Location: [facility] INVASIVE CV LAB;  Service: Cardiovascular;  Laterality: N/A;  ESOPHAGOGASTRODUODENOSCOPY (EGD) WITH PROPOFOL  N/A 12/30/2014   Procedure: ESOPHAGOGASTRODUODENOSCOPY (EGD) WITH PROPOFOL ;  Surgeon: Gladis RAYMOND Mariner, MD;  Location: [facility] ENDOSCOPY;  Service: Endoscopy;  Laterality: N/A;   ESOPHAGOGASTRODUODENOSCOPY (EGD) WITH PROPOFOL  N/A 07/19/2016   Procedure: ESOPHAGOGASTRODUODENOSCOPY (EGD) WITH PROPOFOL ;  Surgeon: Mariner Gladis RAYMOND, MD;  Location: [facility] END[facility];  Service: Endoscopy;  Laterality: N/A;   ESOPHAGOGASTRODUODENOSCOPY (EGD) WITH PROPOFOL  N/A  04/24/2018   Procedure: ESOPHAGOGASTRODUODENOSCOPY (EGD) WITH PROPOFOL ;  Surgeon: Mariner Gladis RAYMOND, MD;  Location: [facility] ENDOSCOPY;  Service: Endoscopy;  Laterality: N/A;   EYE SURGERY  cataracts, detached retina   hysterectomy (other)     LEFT HEART CATH AND CORONARY ANGIOGRAPHY N/A 03/24/2020   Procedure: LEFT HEART CATH AND CORONARY ANGIOGRAPHY;  Surgeon: Mady Bruckner, MD;  Location: [facility] INVASIVE CV LAB;  Service: Cardiovascular;  Laterality: N/A;   Patient Active Problem List   Diagnosis Date Noted   Stroke (HCC) 08/05/2023   Depression with anxiety 08/05/2023   Overweight (BMI 25.0-29.9) 08/05/2023   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) 03/17/2023   Dizziness 02/23/2023   Restrictive airway disease 07/21/2020   Partial thickness rotator cuff tear 03/20/2020   Shoulder pain 03/05/2020   Ataxia 02/28/2020   Difficulty walking 02/28/2020   Physical deconditioning 02/28/2020   Chronic diastolic congestive heart failure (HCC) 11/20/2019   Polyp of ascending colon    Hypokalemia 08/08/2019   TIA (transient ischemic attack) 08/01/2019   Benign hypertensive kidney disease with chronic kidney disease 04/25/2019   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/24/2018   Chronic, continuous use of opioids 03/26/2018   History of adenomatous polyp of colon 01/22/2018   Diverticulosis of colon 01/22/2018   History of CVA (cerebrovascular accident) 11/08/2016   Weakness of left upper extremity 10/30/2016   AAA (abdominal aortic aneurysm) without rupture 08/12/2016   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   Stage 3b chronic kidney disease (HCC) 07/27/2015   Type 2 diabetes mellitus with diabetic chronic kidney disease (HCC) 10/28/2014   Solitary pulmonary nodule on lung CT 08/20/2014   Allergic rhinitis 07/28/2014   Acid reflux 07/28/2014   Chronic obstructive pulmonary disease (HCC) 07/28/2014   Granuloma annulare    Back pain 11/12/2013   Lumbar  scoliosis 10/30/2013   Lumbar radiculopathy 10/30/2013   Lumbar canal stenosis 10/30/2013   Proteinuria 10/22/2013   Smokes with greater than 30 pack year history 03/21/2011   Edema 08/18/2010   Hyperlipidemia 03/25/2009   Coronary artery disease 03/25/2009   Primary hypertension 03/25/2009    ONSET DATE: 08/05/23  REFERRING DIAG:  Z86.73 (ICD-10-CM) - History of CVA (cerebrovascular accident)  R27.0 (ICD-10-CM) - Ataxia    THERAPY DIAG:   Other lack of coordination  Muscle weakness (generalized)  Unsteadiness on feet  Abnormality of gait and mobility  Other abnormalities of gait and mobility  Difficulty in walking, not elsewhere classified  Rationale for Evaluation and Treatment: Rehabilitation  SUBJECTIVE:  SUBJECTIVE STATEMENT:  Pt doing well and is accompanied by daughter. No reported changes since last session.  Pt accompanied by: self and family member, daughter  PERTINENT HISTORY:  PMH: of CVA, COPD, Dysarthria, Ataxia, TIA, T2DM, Back pain, scoliosis   PAIN:  Are you having pain? Yes: NPRS scale: 3/10 Pain location: low back and L hip Pain description: achy Aggravating factors: just hurts all the time. Relieving factors: hydrocodone  at night  PRECAUTIONS: Other: Pt has a looper to see if pt has a-fib  RED FLAGS: Abdominal aneurysm: Yes: pt reports having an abdominal aneurism as found by Dr. Marea.   WEIGHT BEARING RESTRICTIONS: No  FALLS: Has patient fallen in last 6 months? Yes. Number of falls 1.  Pt reports losing balance, and got caught up in cords that were on the floor when she was trying to close the blinds.  LIVING ENVIRONMENT: Lives with: lives with their spouse Lives in: House/apartment Stairs: Yes: External: 1 steps; none Has following equipment at home:  Single point cane, Walker - 2 wheeled, Environmental Consultant - 4 wheeled, shower chair, and Grab bars  PLOF: Independent  PATIENT GOALS: Pt is wanting to gain independence.  Pt is wanting to improve endurance levels and improving speech with SLP.  OBJECTIVE:  Note: Objective measures were completed at Evaluation unless otherwise noted.  DIAGNOSTIC FINDINGS:   EXAM: MRI HEAD WITHOUT CONTRAST   MRA HEAD WITHOUT CONTRAST   MRA NECK WITHOUT AND WITH CONTRAST   IMPRESSION: 1. Small acute infarcts in the left frontal lobe, right parietal lobe, and left cerebellum. 2. Extensive chronic small vessel ischemic disease. 3. 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 4. Unchanged severe right A3 stenosis or segmental occlusion. 5. Moderate left P2 stenosis. 6. Limited assessment of the left vertebral artery origin, otherwise negative neck MRA.   COGNITION: Overall cognitive status: Within functional limits for tasks assessed   SENSATION: WFL, however pt notes having some numbness in the R hand.  COORDINATION: WFL  POSTURE: rounded shoulders, forward head, decreased lumbar lordosis, increased thoracic kyphosis, posterior pelvic tilt, and flexed trunk    LOWER EXTREMITY MMT:    MMT Right Eval Left Eval  Hip flexion 4 4  Hip extension    Hip abduction 4 4-  Hip adduction 4 4-  Hip internal rotation    Hip external rotation    Knee flexion 4 4-  Knee extension 4 4-  Ankle dorsiflexion 4 4-  Ankle plantarflexion    Ankle inversion    Ankle eversion    (Blank rows = not tested)  BED MOBILITY:  Not tested  FUNCTIONAL TESTS:  5 times sit to stand: 24.36 sec Timed up and go (TUG): 23.94 sec with walker 2 minute walk test: TBD 10 meter walk test: 20.33 sec with walker Berg Balance Scale: TBD  PATIENT SURVEYS:   Stroke Impact Scale 16 (Copyrighted instrument, [employer])  In the past 2 weeks, how difficult was it to...  Rating Scale 5 = Not  difficult at all 4 = A little difficult 3 = Somewhat difficult 2 = Very difficult 1 = Could not do at all  a. Dress the top part of your body? 4  b. Bathe yourself? 2  c. Get to the toilet on time? 5  d. Control your bladder (not have an accident)? 5  e. Control your bowels (not have an accident)? 5  f. Stand without losing balance? 3  g. Go shopping? 2  h.  Do heavy household chores (e.g. vacuum, laundry or  yard work)? 1  i. Stay sitting without losing your balance? 3  j. Walk without losing your balance? 2  k. Move from a bed to a chair? 3  l. Walk fast? 1  m. Climb one flight of stairs? 1  n. Walk one block? 1  o. Get in and out of a car? 2  p. Carry heavy objects (e.g. bag of groceries) with your  affected hand? 1  Sum:  41/80  MDC (Minimal Detectable Change) is >/=8                                                                                                                                 TREATMENT DATE: 02/08/2024  TA- To improve functional movements patterns for everyday tasks   Pt arrived to session in transport chair, daughter present.   SpO2: 94% upon arrival to the clinic with room air; seated.  BP= 133/76 mmHg  Left Sitting; HR: 70 BP= 94/62 mmHg Left Standing; HR: 70 BP= 3 min 133/74  mmHG Left Standing 3 min; HR:   Ambulation with CGA with rollator around the gym with focus on purse lip breathing throughout, 2 total laps (320')   BP monitored following the ambulation attempt  seated: SPO2 95% BP:132/71 mmHg  Left Sitting; HR: 72  STS 2 x 8 reps with UE support  -SP02 90% after 2 sets; maintained sitting until returned to 95%  Ambulation with CGA, with use of rollator around the gym with focus on purse lip breathing throughout, 1 total laps (320')  BP monitored following the ambulation attempt: BP= 131/76 mmHg  Left Sitting; HR: 75; SPO2 95%  At end of session: Ambulated ~40 ft to OT session with rollator.   NMR: To facilitate reeducation of  movement, balance, posture, coordination, and/or proprioception/kinesthetic sense.   Standing with one foot elevated at stairs, balance 1 x 30 sec ea LE   Unless otherwise stated, CGA was provided and gait belt donned in order to ensure pt safety   Pt required rest breaks due fatigue, PT was attentive to when pt appeared to be tired or winded in order to prevent excessive fatigue.   PATIENT EDUCATION: Education details: Pt educated on role of PT and services provided during current POC, along with prognosis and information about the clinic.  Person educated: Patient and Child(ren) Education method: Explanation Education comprehension: verbalized understanding and returned demonstration  HOME EXERCISE PROGRAM:  Access Code: [email or web address] URL: [email or web address] Date: 10/17/2023 Prepared by: Sidra Simpers  Exercises - Standing March with Counter Support  - 1 x daily - 3-4 x weekly - 3 sets - 10 reps - Standing Knee Flexion with Unilateral Counter Support  - 1 x daily - 7 x weekly - 3 sets - 10 reps - Standing Hip Extension with Unilateral Counter Support  - 1 x daily - 7 x weekly - 3 sets - 10  reps - Standing Hip Abduction with Unilateral Counter Support  - 1 x daily - 7 x weekly - 3 sets - 10 reps - Heel Toe Raises with Unilateral Counter Support  - 1 x daily - 7 x weekly - 3 sets - 10 reps  GOALS: Goals reviewed with patient? Yes  SHORT TERM GOALS: Target date: 11/15/2023  Pt will be independent with HEP in order to demonstrate increased ability to perform tasks related to occupation/hobbies. Baseline: pt given HEP at eval Goal status: INITIAL  LONG TERM GOALS: Target date: 01/10/2024  1.  Patient (> 64 years old) will complete five times sit to stand test in < 15 seconds indicating an increased LE strength and improved balance. Baseline: 24.36 sec Goal status: INITIAL  2.  Patient will improve SIS 16 score to 55   to demonstrate statistically significant  improvement in mobility and quality of life as it relates to their CVA.  Baseline: 41 Goal status: INITIAL   3.  Patient will increase Berg Balance score by > 6 points to demonstrate decreased fall risk during functional activities. Baseline: 33/56 Goal status: INITIAL   4.  Patient will reduce timed up and go to <11 seconds to reduce fall risk and demonstrate improved transfer/gait ability. Baseline: 23.94 sec with walker Goal status: INITIAL  5.  Patient will increase 10 meter walk test to >1.75m/s as to improve gait speed for better community ambulation and to reduce fall risk. Baseline: 20.33 sec with walker Goal status: INITIAL  6.  Patient will increase 2 minute walk test distance by 50 ft or greater for progression to community ambulator and demonstrate improved gait ability  Baseline: 275'; 83.82 m Goal status: INITIAL     ASSESSMENT:  CLINICAL IMPRESSION:  Patient arrived with good motivation for completion of pt activities.   Pt's vitals monitored carefully throughout the session and noted above. Pt experienced one drop in O2 to 89% after sit to stands, but returned to 95% after ~2 minutes of sitting with pursed lip breathing. Drop in oxygen  may be due to increased sit to stand reps that were completed this session. Will continue to monitor throughout future sessions as well. Pt utilized rollator today in an attempt to increase duration of walking, but pt only able to walk 1 lap of ~150 ft on second attempt. Endurance continues to be a limiting factor. Session ended with working on balance with patient maintaining for 30 seconds without UE support. Pt will continue to benefit from skilled therapy to address remaining deficits in order to improve overall QoL and return to PLOF.    OBJECTIVE IMPAIRMENTS: Abnormal gait, decreased activity tolerance, decreased balance, decreased endurance, decreased knowledge of use of DME, decreased mobility, difficulty walking, decreased  strength, impaired sensation, and pain.   ACTIVITY LIMITATIONS: carrying, lifting, bending, standing, squatting, stairs, transfers, bathing, toileting, dressing, hygiene/grooming, and locomotion level  PARTICIPATION LIMITATIONS: meal prep, cleaning, laundry, community activity, and yard work  PERSONAL FACTORS: Age and 3+ comorbidities:  PMH: of CVA, COPD, Dysarthria, Ataxia, TIA, T2DM, Back pain, scoliosis are also affecting patient's functional outcome.   REHAB POTENTIAL: Good  CLINICAL DECISION MAKING: Evolving/moderate complexity  EVALUATION COMPLEXITY: Moderate  PLAN:  PT FREQUENCY: 2x/week  PT DURATION: 12 weeks  PLANNED INTERVENTIONS: 97750- Physical Performance Testing, 97110-Therapeutic exercises, 97530- Therapeutic activity, [id number]- Neuromuscular re-education, 97535- Self Care, [id number]- Manual therapy, [phone]- Gait training, Balance training, and Vestibular training  PLAN FOR NEXT SESSION:    Monitor SPO2 and get portable oxygen   if necessary  Endurance, balance, strength ( L>R), updated HEP, gait training  -Limiting seated rest breaks as tolerated to challenge endurance   Note: Portions of this document were prepared using Dragon voice recognition software and although reviewed may contain unintentional dictation errors in syntax, grammar, or spelling.  [place], [place]   02/08/2024, 1:24 PM  This entire session was performed under direct supervision and direction of a licensed therapist/therapist assistant . I have personally read, edited and approve of the note as written.    This licensed clinician was present and actively directing care throughout the session at all times.  [name] PT ,DPT Physical Therapist- [place]  [facility]

## 2024-02-09 ENCOUNTER — Other Ambulatory Visit: Payer: Self-pay

## 2024-02-09 ENCOUNTER — Ambulatory Visit: Payer: Self-pay | Admitting: Cardiology

## 2024-02-09 DIAGNOSIS — F17210 Nicotine dependence, cigarettes, uncomplicated: Secondary | ICD-10-CM

## 2024-02-09 DIAGNOSIS — Z87891 Personal history of nicotine dependence: Secondary | ICD-10-CM

## 2024-02-09 DIAGNOSIS — Z122 Encounter for screening for malignant neoplasm of respiratory organs: Secondary | ICD-10-CM

## 2024-02-09 NOTE — Telephone Encounter (Signed)
 Letter mailed to home for patient to call for review of LDCT results.  Patient is receiving ongoing PT/care related to recent stroke.

## 2024-02-09 NOTE — Telephone Encounter (Addendum)
 Spoke with patient and reviewed recent Lung CT results. This was her last annual LCS as she will turn 75 next year. She is in agreement to follow up with thoracic surgery and discuss the 4.7 cm aortic aneurysm. Referral has been sent. Result and plan has also been sent to PCP.    Isaiah, RN

## 2024-02-09 NOTE — Telephone Encounter (Signed)
 Lauraine can you enter the vascular referral? I sent the referral to thoracic and they deferred to [facility].   Isaiah, RN

## 2024-02-09 NOTE — Therapy (Signed)
 " OUTPATIENT OCCUPATIONAL THERAPY NEURO TREATMENT NOTE  Patient Name: Hannah Kirby MRN: [id number] DOB:[birth year], 78 y.o., female Today's Date: 02/09/2024  PCP: Dr. [name] Perry REFERRING PROVIDER: Dr. [name] Perry  END OF SESSION:  OT End of Session - 02/09/24 1249     Visit Number 15    Number of Visits 24    Date for Recertification  04/18/24    OT Start Time 1400    OT Stop Time 1445    OT Time Calculation (min) 45 min    Equipment Utilized During Treatment transport chair    Activity Tolerance Patient tolerated treatment well    Behavior During Therapy WFL for tasks assessed/performed         Past Medical History:  Diagnosis Date   Acute hemorrhagic colitis 08/08/2019   Anemia    C. difficile colitis 08/09/2019   CAD (coronary artery disease)    a. 02/2006 PCI: BMS x 2 to RCA, cath o/w without significant coronary disease; b. nuclear stress test 07/2014: No ischemia/infarct; c. 11/2017 MV: no isch/infarct, EF 55-65%; d. 03/2020 NSTEMI/PCI: LM nl, LAD min irregs, RI 25, small, LCX nl, RCA 30p/m ISR, 99d (3.0x15 Resolute Onyx DES).   Cataract 02/04/2018   Cerebrovascular accident (CVA) (HCC) 03/18/2023   dysarthria   Chronic bronchitis (HCC)    secondary to cigarette smoking   FHx: allergies    Goiter    Granulomatous disease    Hernia    Kidney stone on left side 2013   NSTEMI (non-ST elevated myocardial infarction) (HCC) 03/23/2020   Oxygen  deficiency    Panic attacks    PVC's (premature ventricular contractions)    a. 03/2018 Zio: Occas PVCs (2.5%). Triggered events assoc w/ PVC/PAC.   Retinal detachment, left 03/04/2020   Retinal tear 2020   Stroke (HCC) 10/29/2016   mild left side weakness   Stroke (HCC) 08/01/2019   Tobacco abuse    Past Surgical History:  Procedure Laterality Date   ABDOMINAL HYSTERECTOMY     BREAST SURGERY     CATARACT EXTRACTION W/PHACO Right 03/01/2017   Procedure: CATARACT EXTRACTION PHACO AND INTRAOCULAR LENS PLACEMENT (IOC)  RIGHT DIABETIC;  Surgeon: Mittie Gaskin, MD;  Location: [facility];  Service: Ophthalmology;  Laterality: Right;   CATARACT EXTRACTION W/PHACO Left 03/22/2017   Procedure: CATARACT EXTRACTION PHACO AND INTRAOCULAR LENS PLACEMENT (IOC) LEFT DIABETIC;  Surgeon: Mittie Gaskin, MD;  Location: [facility];  Service: Ophthalmology;  Laterality: Left;  Diabetic - insulin  and oral meds   COLONOSCOPY WITH PROPOFOL  N/A 09/23/2014   Procedure: COLONOSCOPY WITH PROPOFOL ;  Surgeon: Gladis RAYMOND Mariner, MD;  Location: [facility] ENDOSCOPY;  Service: Endoscopy;  Laterality: N/A;   COLONOSCOPY WITH PROPOFOL  N/A 01/11/2018   Procedure: COLONOSCOPY WITH PROPOFOL ;  Surgeon: Mariner Gladis RAYMOND, MD;  Location: [facility] END[facility]COPY;  Service: Endoscopy;  Laterality: N/A;   COLONOSCOPY WITH PROPOFOL  N/A 04/24/2018   Procedure: COLONOSCOPY WITH PROPOFOL ;  Surgeon: Mariner Gladis RAYMOND, MD;  Location: [facility] ENDOSCOPY;  Service: Endoscopy;  Laterality: N/A;   COLONOSCOPY WITH PROPOFOL  N/A 11/19/2019   Procedure: COLONOSCOPY WITH PROPOFOL ;  Surgeon: Jinny Carmine, MD;  Location: [facility] ENDOSCOPY;  Service: Endoscopy;  Laterality: N/A;   CORONARY ANGIOPLASTY WITH STENT PLACEMENT  2008   CORONARY STENT INTERVENTION N/A 03/24/2020   Procedure: CORONARY STENT INTERVENTION;  Surgeon: Mady Bruckner, MD;  Location: [facility] INVASIVE CV LAB;  Service: Cardiovascular;  Laterality: N/A;   ESOPHAGOGASTRODUODENOSCOPY (EGD) WITH PROPOFOL  N/A 12/30/2014   Procedure: ESOPHAGOGASTRODUODENOSCOPY (EGD) WITH PROPOFOL ;  Surgeon:  Gladis RAYMOND Mariner, MD;  Location: [facility] ENDOSCOPY;  Service: Endoscopy;  Laterality: N/A;   ESOPHAGOGASTRODUODENOSCOPY (EGD) WITH PROPOFOL  N/A 07/19/2016   Procedure: ESOPHAGOGASTRODUODENOSCOPY (EGD) WITH PROPOFOL ;  Surgeon: Mariner Gladis RAYMOND, MD;  Location: [facility] ENDOSCOPY;  Service: Endoscopy;  Laterality: N/A;   ESOPHAGOGASTRODUODENOSCOPY (EGD) WITH PROPOFOL  N/A 04/24/2018   Procedure:  ESOPHAGOGASTRODUODENOSCOPY (EGD) WITH PROPOFOL ;  Surgeon: Mariner Gladis RAYMOND, MD;  Location: [facility] ENDOSCOPY;  Service: Endoscopy;  Laterality: N/A;   EYE SURGERY  cataracts, detached retina   hysterectomy (other)     LEFT HEART CATH AND CORONARY ANGIOGRAPHY N/A 03/24/2020   Procedure: LEFT HEART CATH AND CORONARY ANGIOGRAPHY;  Surgeon: Mady Bruckner, MD;  Location: [facility] INVASIVE CV LAB;  Service: Cardiovascular;  Laterality: N/A;   Patient Active Problem List   Diagnosis Date Noted   Stroke (HCC) 08/05/2023   Depression with anxiety 08/05/2023   Overweight (BMI 25.0-29.9) 08/05/2023   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) 03/17/2023   Dizziness 02/23/2023   Restrictive airway disease 07/21/2020   Partial thickness rotator cuff tear 03/20/2020   Shoulder pain 03/05/2020   Ataxia 02/28/2020   Difficulty walking 02/28/2020   Physical deconditioning 02/28/2020   Chronic diastolic congestive heart failure (HCC) 11/20/2019   Polyp of ascending colon    Hypokalemia 08/08/2019   TIA (transient ischemic attack) 08/01/2019   Benign hypertensive kidney disease with chronic kidney disease 04/25/2019   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/24/2018   Chronic, continuous use of opioids 03/26/2018   History of adenomatous polyp of colon 01/22/2018   Diverticulosis of colon 01/22/2018   History of CVA (cerebrovascular accident) 11/08/2016   Weakness of left upper extremity 10/30/2016   AAA (abdominal aortic aneurysm) without rupture 08/12/2016   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   Stage 3b chronic kidney disease (HCC) 07/27/2015   Type 2 diabetes mellitus with diabetic chronic kidney disease (HCC) 10/28/2014   Solitary pulmonary nodule on lung CT 08/20/2014   Allergic rhinitis 07/28/2014   Acid reflux 07/28/2014   Chronic obstructive pulmonary disease (HCC) 07/28/2014   Granuloma annulare    Back pain 11/12/2013   Lumbar scoliosis 10/30/2013   Lumbar  radiculopathy 10/30/2013   Lumbar canal stenosis 10/30/2013   Proteinuria 10/22/2013   Smokes with greater than 30 pack year history 03/21/2011   Edema 08/18/2010   Hyperlipidemia 03/25/2009   Coronary artery disease 03/25/2009   Primary hypertension 03/25/2009   ONSET DATE: 08/05/2023  REFERRING DIAG: M70.101 (ICD-10-CM) - Weakness of left upper extremity   THERAPY DIAG:  Muscle weakness (generalized)  Other lack of coordination  Rationale for Evaluation and Treatment: Rehabilitation  SUBJECTIVE:  SUBJECTIVE STATEMENT: Pt in agreement to meal prep activity today.   Pt accompanied by: family member: daughter, Harrie  PERTINENT HISTORY: Daughter present and provided hx.  Daughter reports pt has had 4 strokes, beginning in 2018, 2019, January of 2025 and the most recent in June of 2025.  Pt had been participating in [facility] since the most recent CVA, and is now eager to transition to outpatient services where PT/OT/SLP have been ordered. Per MEDICAL RECORD NUMBERPMH: of CVA, COPD, Dysarthria, Ataxia, TIA, T2DM, Back pain, scoliosis   PRECAUTIONS: Fall, Loop recorder for Afib  WEIGHT BEARING RESTRICTIONS: No  PAIN:  02/08/24: 3/10 back pain Are you having pain? Yes: NPRS scale: 3/10, can get up to 7-8/10 Pain location: low back and L hip Pain description: achy, sharp, dull Aggravating factors: prolonged standing  Relieving factors: sitting, pain meds, heat/ice  FALLS: Has patient fallen in last 6 months? Yes. Number of falls 1, tripping over cords  LIVING ENVIRONMENT: Lives with: lives with their spouse Lives in: 1 level home Stairs: Yes: External: 1 steps; none Has following equipment at home: walk in shower, comfort height toilet   PLOF: Prior to Jan of this year, pt was managing all home management tasks, was able to drive, and was caring for spouse   PATIENT GOALS: Strengthening the left arm   OBJECTIVE:  Note: Objective measures were completed at Evaluation unless  otherwise noted.  HAND DOMINANCE: Right  ADLs: Overall ADLs: Daughter is pt's primary caregiver and lives near by Transfers/ambulation related to ADLs: occasional help with using walker at home, depending on balance on a given day Eating: indep/able to cut food  Grooming: bimanual grooming without difficulty UB Dressing: extra time for clothing fasteners, but able to gather clothing with RW LB Dressing: extra time with clothing fasteners Toileting: occasional help to walk to the bathroom, but modified indep within the bathroom for toileting tasks Bathing: direct supv for bathing using walkin in shower, shower chair, and grab bar; gets hair washed weekly at salon Tub Shower transfers: supv for walk in shower Equipment: RW, SBQC, shower chair, grab bars  IADLs: Shopping: daughter currently managing Light housekeeping: daughter currently managing; can start laundry on a good day.  Meal Prep: starting to participate in light meal prep, including making eggs on stove top Community mobility: daughter assists with all transportation; pt using transport chair to reach therapy gym today Medication management: daughter sets up pills 2 weeks at a time  Financial management: pt is participating in bill paying with daughter's assistance; most are online Handwriting: 100% legible  POSTURE COMMENTS:  rounded shoulders, forward head, increased thoracic kyphosis  ACTIVITY TOLERANCE: Activity tolerance: to be further assessed within functional contexts  FUNCTIONAL OUTCOME MEASURES: TBD  UPPER EXTREMITY ROM:  BUEs WFL for daily tasks  UPPER EXTREMITY MMT:  Hx of R rotator cuff tear 4-5 years ago with residual weakness, non-surgical;    MMT Right eval Right 01/03/24 Left eval Left 01/03/24  Shoulder flexion 3+ 3+ 4 4  Shoulder abduction 3+ 3+ 4 4  Shoulder adduction      Shoulder extension      Shoulder internal rotation 3+ 3+ 4- 4  Shoulder external rotation 3+ 3+ 3+ 4-  Elbow  flexion 5 5 5 5   Elbow extension 5 5 5 5   Wrist flexion 4+ 5 5 5   Wrist extension 4+ 5 5 5   Wrist ulnar deviation      Wrist radial deviation      Wrist pronation      Wrist supination      (Blank rows = not tested)   *Most recent CVA affecting L non-dominant side, with LUE presenting with increased strength as compared to dominant/unaffected arm.  Assume RUE weaker from hx of rotator cuff tear, or possibly previous CVAs.  Pt does present with mild LUE ataxia, and bilat hand weakness.  HAND FUNCTION: Grip strength: Right: 32 lbs; Left: 30 lbs, Lateral pinch: Right: 8 lbs, Left: 6 lbs, and 3 point pinch: Right: 7 lbs, Left: 6 lbs Grip strength: Right: 40 lbs; Left: 30 lbs, Lateral pinch: Right: 16 lbs, Left: 12; 3 point pinch: Right: 18 lbs, Left: 13 lbs  01/25/24: Right: 35 lbs; Left: 30 lbs, Lateral pinch: Right 7 lbs, Left: 5 lbs, and 3 point pinch: Right: 7 lbs, Left: 4 lbs   COORDINATION: Finger Nose  Finger test: LUE mild ataxia  9 Hole Peg test: Right: 29 sec; Left: 47 sec 01/03/24: Right: 27 sec, Left: 37 sec  01/25/24: Right 27 sec, Left: 35 sec   SENSATION: Pt reports intermittent numbness in the R thumb, IF, and LF  EDEMA: No visible edema  MUSCLE TONE: RUE: Within functional limits and LUE: Within functional limits  COGNITION: Overall cognitive status: Impaired; see SLP eval for details  VISION: Pt reports L eye feels more blurry since first stroke in 2018; pt has reading glasses but doesn't usually wear them   VISION ASSESSMENT: To be further assessed in functional context Tracking/Visual pursuits: Able to track stimulus in all quads without difficulty Saccades: WFL Visual Fields: no apparent deficits Clock drawing completed: all numbers spaced evenly, but pt left out the 1 and verbalized not knowing how to draw ten minutes to eleven when this direction was given.  Patient has difficulty with following activities due to following visual impairments: To be further  assessed  PERCEPTION: To be further assessed  PRAXIS: Impaired: Motor planning  OBSERVATIONS: Pt pleasant, cooperative.  Daughter present and very supportive in pt's care.                                                                                                                       TREATMENT DATE:02/08/24 Self Care: -Functional mobility with rollator with close SBA completed to obtain meal prep items from hospital cafeteria and transport items back to OT kitchen.  3 short seated rest breaks (~1-2 min each) d/t fatigue/EC within about 15 min period.  Min vc for use of rollator seat for item transport.  -Participation in light meal prep at stove top to make a grilled cheese sandwich.  OT provided set up of cooking supplies for [facility] purposes.  Pt tolerated standing for hand hygiene and to place set up of food items in pan, and brief periods while flipping grilled bread.  Pt was otherwise encouraged to utilize rollator seat for EC while waiting for bread to grill.  Pt required min A during STS from rollator d/t difficulty standing without rollator rolling backwards.  Pt required min vc for use of rollator to transport prepared food from stove to countertop.   -OT reviewed EC strategies for meal prep/clean up, including keeping commonly used meal prep items out on countertop for easy access, sitting intermittently/prn to allow for rest breaks, breaking up clean up by soaking dishes for cleaning later/delaying clean up to allow period of rest as needed.      PATIENT EDUCATION: Education details: EC/meal prep strategies  Person educated: Patient and Child(ren) Education method: Explanation and Verbal cues Education comprehension: verbalized understanding  HOME EXERCISE PROGRAM: Yellow theraputty  GOALS: Goals reviewed with patient? Yes  SHORT TERM GOALS: Target date: 03/07/24  Pt will perform HEP with min vc or less for improving LUE coordination and bilat hand strength. Baseline: Eval: HEP  not yet initiated; 01/03/24: Pt acknowledges minimal participation in HEP in the home,  but does have HEP in place with putty and coordination training activities; 01/25/24: see 01/03/24 Goal status: d/c/ not achieved  2.  Pt will be indep to verbalize and implement 2-3 fall prevention measures to reduce fall risk with ADLs. Baseline: Eval: Educ not yet initiated; 01/03/24: 1 fall last week after reaching to floor level to pick up soiled dog pad while dizzy; pt able to verbalize 3 fall prevention strategies with min vc; 01/25/24: pt uses countertop for support while bending/reaching/ambulating in kitchen, but is inconsistent with walker in the home   Goal status: in progress  LONG TERM GOALS: Target date: 04/18/24  Pt will perform shower transfers with modified indep Baseline: Eval: Direct supv; 01/03/24: direct supv from spouse, which pt plans to continue Goal status: d/c  2.  Pt will improve LUE GMC to enable reaching with good accuracy into overhead kitchen cabinets. Baseline: Eval: Mild LUE ataxia; 01/03/24: Residual mild LUE ataxia and pt denies attempts at reaching into kitchen cupboards d/t family managing meals; 01/25/24: Demonstrated today with good accuracy  Goal status: achieved  3.  Pt will improve L hand [facility] skills as demonstrated by 10 sec or more improvement in 9 hole peg test for improved efficiency when manipulating  clothing fasteners. Baseline: Eval: L 9 hole peg test: L 47 sec, R 29 sec; 01/03/24: L 37 sec, R 27 sec, indep with clothing fasteners Goal status: achieved  4.  Pt will manage light loads of laundry with modified indep. Baseline: Eval: Daughter mostly manages, but pt can start laundry on a good day; 01/03/24: Pt can start laundry, and has been given recommendations for safe reaching strategies and item transport for increasing indep with this task, but has not yet attempted these strategies at home.  Pt continues to rely on family to manage laundry tasks; 01/25/24:  Same as 01/03/24 as daughter reports she has not yet implemented recommendations for de-cluttering laundry room to allow space for a chair  Goal status: in progress  5. Pt will perform hot or cold light meal prep with distant supv and RW or rollator for at least 1 meal per day with reported RPE of 6 or less on modified Borg scale. Baseline: 01/03/24: Family currently manages meal preparation; 01/25/24: Pt has started to use microwave and occasionally stove top to reheat food, but verbalizes taxing effort; RPE 7/10 (vigorous activity) on modified Borg scale.   Goal status: Revised on 01/25/24      6.  Pt will verbalize and implement 1-2 EC strategies with min vc or less to enable light sweeping and mopping of kitchen floor with reported RPE of 6 or less on modified Borg scale.      Baseline: 01/25/24: RPE 7-8.  Often relies on daughter.     Goal status: New  ASSESSMENT: CLINICAL IMPRESSION: Functional mobility completed within 15 min period requiring 3 seated rest breaks of 1-2 min d/t fatigue while ambulating to/from cafeteria to therapy kitchen to obtain meal prep items.  Pt able to make a grilled cheese at stove top today with close supv, vc for EC strategies and fall prevention, and 1 time min A for stabilizing during an uncontrolled STS from rollator seat d/t rollator moving during transfer.  OT recommended pt avoid using rollator in home unless daughter is present for safety, as pt is inconsistent to remember to back rollator up against a wall or countertop to reduce fall risk upon standing from rollator seat.  Not yet ready to use rollator independently during  ADL/IADLs d/t reduced safety.  Pt/daughter verbalized understanding.  Pt continues to benefit from skilled OT to maximize indep, safety, and efficiency with IADL tasks.     PERFORMANCE DEFICITS: in functional skills including ADLs, IADLs, coordination, dexterity, sensation, ROM, strength, pain, Fine motor control, Gross motor control,  mobility, balance, body mechanics, endurance, decreased knowledge of precautions, decreased knowledge of use of DME, vision, and UE functional use, cognitive skills including emotional, perception, problem solving, safety awareness, temperament/personality, and understand, and psychosocial skills including coping strategies, environmental adaptation, and routines and behaviors.   IMPAIRMENTS: are limiting patient from ADLs, IADLs, leisure, and social participation.   CO-MORBIDITIES: has co-morbidities such as afib, AAA, hx of multiple CVAs that affects occupational performance. Patient will benefit from skilled OT to address above impairments and improve overall function.  MODIFICATION OR ASSISTANCE TO COMPLETE EVALUATION: Min-Moderate modification of tasks or assist with assess necessary to complete an evaluation.  OT OCCUPATIONAL PROFILE AND HISTORY: Detailed assessment: Review of records and additional review of physical, cognitive, psychosocial history related to current functional performance.  CLINICAL DECISION MAKING: Moderate - several treatment options, min-mod task modification necessary  REHAB POTENTIAL: Good  EVALUATION COMPLEXITY: Moderate    PLAN:  OT FREQUENCY: 2x/week  OT DURATION: 12 weeks  PLANNED INTERVENTIONS: 97168 OT Re-evaluation, 97535 self care/ADL training, 02889 therapeutic exercise, 97530 therapeutic activity, 97112 neuromuscular re-education, 97140 manual therapy, 97116 gait training, 02989 moist heat, 97010 cryotherapy, 97129 Cognitive training (first 15 min), 02249 Physical Performance Testing, balance training, functional mobility training, visual/perceptual remediation/compensation, psychosocial skills training, energy conservation, coping strategies training, patient/family education, and DME and/or AE instructions  RECOMMENDED OTHER SERVICES: none at this time (OT/PT/SLP services have all been ordered)  CONSULTED AND AGREED WITH PLAN OF CARE: Patient and  family member/caregiver  PLAN FOR NEXT SESSION: see above  Inocente Blazing, MS, OTR/L  02/09/2024, 12:51 PM     "

## 2024-02-12 NOTE — Addendum Note (Signed)
 Addended by: GASPER [name] BRAVO on: 02/12/2024 12:57 PM   Modules accepted: Orders

## 2024-02-13 ENCOUNTER — Ambulatory Visit: Admitting: Physical Therapy

## 2024-02-13 ENCOUNTER — Ambulatory Visit

## 2024-02-20 ENCOUNTER — Ambulatory Visit: Admitting: Physical Therapy

## 2024-02-20 ENCOUNTER — Ambulatory Visit

## 2024-02-21 ENCOUNTER — Ambulatory Visit

## 2024-02-23 ENCOUNTER — Ambulatory Visit: Attending: Cardiology

## 2024-02-23 DIAGNOSIS — I639 Cerebral infarction, unspecified: Secondary | ICD-10-CM | POA: Diagnosis not present

## 2024-02-23 LAB — CUP PACEART REMOTE DEVICE CHECK
Date Time Interrogation Session: 20260101232942
Implantable Pulse Generator Implant Date: 20250626

## 2024-02-24 ENCOUNTER — Ambulatory Visit: Payer: Self-pay | Admitting: Cardiology

## 2024-02-27 ENCOUNTER — Ambulatory Visit

## 2024-02-27 ENCOUNTER — Ambulatory Visit: Attending: Family Medicine | Admitting: Physical Therapy

## 2024-02-27 DIAGNOSIS — R482 Apraxia: Secondary | ICD-10-CM | POA: Insufficient documentation

## 2024-02-27 DIAGNOSIS — R278 Other lack of coordination: Secondary | ICD-10-CM

## 2024-02-27 DIAGNOSIS — R4701 Aphasia: Secondary | ICD-10-CM | POA: Insufficient documentation

## 2024-02-27 DIAGNOSIS — R2681 Unsteadiness on feet: Secondary | ICD-10-CM | POA: Insufficient documentation

## 2024-02-27 DIAGNOSIS — R262 Difficulty in walking, not elsewhere classified: Secondary | ICD-10-CM | POA: Insufficient documentation

## 2024-02-27 DIAGNOSIS — R2689 Other abnormalities of gait and mobility: Secondary | ICD-10-CM | POA: Insufficient documentation

## 2024-02-27 DIAGNOSIS — M6281 Muscle weakness (generalized): Secondary | ICD-10-CM | POA: Insufficient documentation

## 2024-02-27 DIAGNOSIS — R269 Unspecified abnormalities of gait and mobility: Secondary | ICD-10-CM | POA: Insufficient documentation

## 2024-02-27 NOTE — Therapy (Signed)
 " OUTPATIENT PHYSICAL THERAPY NEURO TREATMENT   Patient Name: Hannah Kirby MRN: [id number] DOB:[birth year], 79 y.o., female Today's Date: 02/27/2024   PCP: Gasper [name] BRAVO, MD REFERRING PROVIDER: Gasper [name] BRAVO, MD  END OF SESSION:    PT End of Session - 02/27/24 1359     Visit Number 19    Number of Visits 39    Date for Recertification  04/18/24    Progress Note Due on Visit 20    PT Start Time 1317    PT Stop Time 1359    PT Time Calculation (min) 42 min    Equipment Utilized During Treatment Gait belt    Activity Tolerance Patient tolerated treatment well    Behavior During Therapy WFL for tasks assessed/performed            Past Medical History:  Diagnosis Date   Acute hemorrhagic colitis 08/08/2019   Anemia    C. difficile colitis 08/09/2019   CAD (coronary artery disease)    a. 02/2006 PCI: BMS x 2 to RCA, cath o/w without significant coronary disease; b. nuclear stress test 07/2014: No ischemia/infarct; c. 11/2017 MV: no isch/infarct, EF 55-65%; d. 03/2020 NSTEMI/PCI: LM nl, LAD min irregs, RI 25, small, LCX nl, RCA 30p/m ISR, 99d (3.0x15 Resolute Onyx DES).   Cataract 02/04/2018   Cerebrovascular accident (CVA) (HCC) 03/18/2023   dysarthria   Chronic bronchitis (HCC)    secondary to cigarette smoking   FHx: allergies    Goiter    Granulomatous disease    Hernia    Kidney stone on left side 2013   NSTEMI (non-ST elevated myocardial infarction) (HCC) 03/23/2020   Oxygen  deficiency    Panic attacks    PVC's (premature ventricular contractions)    a. 03/2018 Zio: Occas PVCs (2.5%). Triggered events assoc w/ PVC/PAC.   Retinal detachment, left 03/04/2020   Retinal tear 2020   Stroke (HCC) 10/29/2016   mild left side weakness   Stroke (HCC) 08/01/2019   Tobacco abuse    Past Surgical History:  Procedure Laterality Date   ABDOMINAL HYSTERECTOMY     BREAST SURGERY     CATARACT EXTRACTION W/PHACO Right 03/01/2017   Procedure: CATARACT EXTRACTION PHACO  AND INTRAOCULAR LENS PLACEMENT (IOC) RIGHT DIABETIC;  Surgeon: Mittie Gaskin, MD;  Location: [facility];  Service: Ophthalmology;  Laterality: Right;   CATARACT EXTRACTION W/PHACO Left 03/22/2017   Procedure: CATARACT EXTRACTION PHACO AND INTRAOCULAR LENS PLACEMENT (IOC) LEFT DIABETIC;  Surgeon: Mittie Gaskin, MD;  Location: [facility];  Service: Ophthalmology;  Laterality: Left;  Diabetic - insulin  and oral meds   COLONOSCOPY WITH PROPOFOL  N/A 09/23/2014   Procedure: COLONOSCOPY WITH PROPOFOL ;  Surgeon: Gladis RAYMOND Mariner, MD;  Location: [facility] ENDOSCOPY;  Service: Endoscopy;  Laterality: N/A;   COLONOSCOPY WITH PROPOFOL  N/A 01/11/2018   Procedure: COLONOSCOPY WITH PROPOFOL ;  Surgeon: Mariner Gladis RAYMOND, MD;  Location: [facility] END[facility]COPY;  Service: Endoscopy;  Laterality: N/A;   COLONOSCOPY WITH PROPOFOL  N/A 04/24/2018   Procedure: COLONOSCOPY WITH PROPOFOL ;  Surgeon: Mariner Gladis RAYMOND, MD;  Location: [facility] ENDOSCOPY;  Service: Endoscopy;  Laterality: N/A;   COLONOSCOPY WITH PROPOFOL  N/A 11/19/2019   Procedure: COLONOSCOPY WITH PROPOFOL ;  Surgeon: Jinny Carmine, MD;  Location: [facility] ENDOSCOPY;  Service: Endoscopy;  Laterality: N/A;   CORONARY ANGIOPLASTY WITH STENT PLACEMENT  2008   CORONARY STENT INTERVENTION N/A 03/24/2020   Procedure: CORONARY STENT INTERVENTION;  Surgeon: Mady Bruckner, MD;  Location: [facility] INVASIVE CV LAB;  Service: Cardiovascular;  Laterality: N/A;  ESOPHAGOGASTRODUODENOSCOPY (EGD) WITH PROPOFOL  N/A 12/30/2014   Procedure: ESOPHAGOGASTRODUODENOSCOPY (EGD) WITH PROPOFOL ;  Surgeon: Gladis RAYMOND Mariner, MD;  Location: [facility] ENDOSCOPY;  Service: Endoscopy;  Laterality: N/A;   ESOPHAGOGASTRODUODENOSCOPY (EGD) WITH PROPOFOL  N/A 07/19/2016   Procedure: ESOPHAGOGASTRODUODENOSCOPY (EGD) WITH PROPOFOL ;  Surgeon: Mariner Gladis RAYMOND, MD;  Location: [facility] ENDOSCOPY;  Service: Endoscopy;  Laterality: N/A;   ESOPHAGOGASTRODUODENOSCOPY (EGD) WITH PROPOFOL  N/A  04/24/2018   Procedure: ESOPHAGOGASTRODUODENOSCOPY (EGD) WITH PROPOFOL ;  Surgeon: Mariner Gladis RAYMOND, MD;  Location: [facility] ENDOSCOPY;  Service: Endoscopy;  Laterality: N/A;   EYE SURGERY  cataracts, detached retina   hysterectomy (other)     LEFT HEART CATH AND CORONARY ANGIOGRAPHY N/A 03/24/2020   Procedure: LEFT HEART CATH AND CORONARY ANGIOGRAPHY;  Surgeon: Mady Bruckner, MD;  Location: [facility] INVASIVE CV LAB;  Service: Cardiovascular;  Laterality: N/A;   Patient Active Problem List   Diagnosis Date Noted   Stroke (HCC) 08/05/2023   Depression with anxiety 08/05/2023   Overweight (BMI 25.0-29.9) 08/05/2023   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) 03/17/2023   Dizziness 02/23/2023   Restrictive airway disease 07/21/2020   Partial thickness rotator cuff tear 03/20/2020   Shoulder pain 03/05/2020   Ataxia 02/28/2020   Difficulty walking 02/28/2020   Physical deconditioning 02/28/2020   Chronic diastolic congestive heart failure (HCC) 11/20/2019   Polyp of ascending colon    Hypokalemia 08/08/2019   TIA (transient ischemic attack) 08/01/2019   Benign hypertensive kidney disease with chronic kidney disease 04/25/2019   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/24/2018   Chronic, continuous use of opioids 03/26/2018   History of adenomatous polyp of colon 01/22/2018   Diverticulosis of colon 01/22/2018   History of CVA (cerebrovascular accident) 11/08/2016   Weakness of left upper extremity 10/30/2016   AAA (abdominal aortic aneurysm) without rupture 08/12/2016   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   Stage 3b chronic kidney disease (HCC) 07/27/2015   Type 2 diabetes mellitus with diabetic chronic kidney disease (HCC) 10/28/2014   Solitary pulmonary nodule on lung CT 08/20/2014   Allergic rhinitis 07/28/2014   Acid reflux 07/28/2014   Chronic obstructive pulmonary disease (HCC) 07/28/2014   Granuloma annulare    Back pain 11/12/2013   Lumbar  scoliosis 10/30/2013   Lumbar radiculopathy 10/30/2013   Lumbar canal stenosis 10/30/2013   Proteinuria 10/22/2013   Smokes with greater than 30 pack year history 03/21/2011   Edema 08/18/2010   Hyperlipidemia 03/25/2009   Coronary artery disease 03/25/2009   Primary hypertension 03/25/2009    ONSET DATE: 08/05/23  REFERRING DIAG:  Z86.73 (ICD-10-CM) - History of CVA (cerebrovascular accident)  R27.0 (ICD-10-CM) - Ataxia    THERAPY DIAG:   Unsteadiness on feet  Abnormality of gait and mobility  Other abnormalities of gait and mobility  Difficulty in walking, not elsewhere classified  Rationale for Evaluation and Treatment: Rehabilitation  SUBJECTIVE:  SUBJECTIVE STATEMENT:  Pt doing well and is accompanied by daughter. No reported changes since last session.  Pt accompanied by: self and family member, daughter  PERTINENT HISTORY:  PMH: of CVA, COPD, Dysarthria, Ataxia, TIA, T2DM, Back pain, scoliosis   PAIN:  Are you having pain? Yes: NPRS scale: 3/10 Pain location: low back and L hip Pain description: achy Aggravating factors: just hurts all the time. Relieving factors: hydrocodone  at night  PRECAUTIONS: Other: Pt has a looper to see if pt has a-fib  RED FLAGS: Abdominal aneurysm: Yes: pt reports having an abdominal aneurism as found by Dr. Marea.   WEIGHT BEARING RESTRICTIONS: No  FALLS: Has patient fallen in last 6 months? Yes. Number of falls 1.  Pt reports losing balance, and got caught up in cords that were on the floor when she was trying to close the blinds.  LIVING ENVIRONMENT: Lives with: lives with their spouse Lives in: House/apartment Stairs: Yes: External: 1 steps; none Has following equipment at home: Single point cane, Walker - 2 wheeled, Environmental Consultant - 4 wheeled,  shower chair, and Grab bars  PLOF: Independent  PATIENT GOALS: Pt is wanting to gain independence.  Pt is wanting to improve endurance levels and improving speech with SLP.  OBJECTIVE:  Note: Objective measures were completed at Evaluation unless otherwise noted.  DIAGNOSTIC FINDINGS:   EXAM: MRI HEAD WITHOUT CONTRAST   MRA HEAD WITHOUT CONTRAST   MRA NECK WITHOUT AND WITH CONTRAST   IMPRESSION: 1. Small acute infarcts in the left frontal lobe, right parietal lobe, and left cerebellum. 2. Extensive chronic small vessel ischemic disease. 3. 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 4. Unchanged severe right A3 stenosis or segmental occlusion. 5. Moderate left P2 stenosis. 6. Limited assessment of the left vertebral artery origin, otherwise negative neck MRA.   COGNITION: Overall cognitive status: Within functional limits for tasks assessed   SENSATION: WFL, however pt notes having some numbness in the R hand.  COORDINATION: WFL  POSTURE: rounded shoulders, forward head, decreased lumbar lordosis, increased thoracic kyphosis, posterior pelvic tilt, and flexed trunk    LOWER EXTREMITY MMT:    MMT Right Eval Left Eval  Hip flexion 4 4  Hip extension    Hip abduction 4 4-  Hip adduction 4 4-  Hip internal rotation    Hip external rotation    Knee flexion 4 4-  Knee extension 4 4-  Ankle dorsiflexion 4 4-  Ankle plantarflexion    Ankle inversion    Ankle eversion    (Blank rows = not tested)  BED MOBILITY:  Not tested  FUNCTIONAL TESTS:  5 times sit to stand: 24.36 sec Timed up and go (TUG): 23.94 sec with walker 2 minute walk test: TBD 10 meter walk test: 20.33 sec with walker Berg Balance Scale: TBD  PATIENT SURVEYS:   Stroke Impact Scale 16 (Copyrighted instrument, [facility])  In the past 2 weeks, how difficult was it to...  Rating Scale 5 = Not difficult at all 4 = A little difficult 3 = Somewhat  difficult 2 = Very difficult 1 = Could not do at all  a. Dress the top part of your body? 4  b. Bathe yourself? 2  c. Get to the toilet on time? 5  d. Control your bladder (not have an accident)? 5  e. Control your bowels (not have an accident)? 5  f. Stand without losing balance? 3  g. Go shopping? 2  h.  Do heavy household chores (e.g. vacuum, laundry or  yard work)? 1  i. Stay sitting without losing your balance? 3  j. Walk without losing your balance? 2  k. Move from a bed to a chair? 3  l. Walk fast? 1  m. Climb one flight of stairs? 1  n. Walk one block? 1  o. Get in and out of a car? 2  p. Carry heavy objects (e.g. bag of groceries) with your  affected hand? 1  Sum:  41/80  MDC (Minimal Detectable Change) is >/=8                                                                                                                                 TREATMENT DATE: 02/27/2024  TA- To improve functional movements patterns for everyday tasks   Pt arrived to session in transport chair, daughter present.   BP= 139/87 HR 73SPO2 93% seated on room air  Nustep level 1 x 6 min B UE and LE reciprocal movement training    BP monitored following the ambulation attempt  seated: SPO2 95% BP:132/71 mmHg  Left Sitting; HR: 72  Sidestepping no UE support 2 x 10 ea direction  -SPO2 97% post  Standing march with 3# AW donned x 10 ea LE   Standing step tap 2 x 10 ea LE   Loaded Shopping cart style AD with DTR in transport chair/shopping cart x 75 ft to SLP office to end session   NMR: To facilitate reeducation of movement, balance, posture, coordination, and/or proprioception/kinesthetic sense.   Standing with one foot elevated at stairs, other on airex, balance 2 x 30 sec ea LE  Hedgehog tap with hedgehog on step ant - 2 x 10 ea LE   Unless otherwise stated, CGA was provided and gait belt donned in order to ensure pt safety   Pt required rest breaks due fatigue, PT was attentive  to when pt appeared to be tired or winded in order to prevent excessive fatigue.   PATIENT EDUCATION: Education details: Pt educated on role of PT and services provided during current POC, along with prognosis and information about the clinic.  Person educated: Patient and Child(ren) Education method: Explanation Education comprehension: verbalized understanding and returned demonstration  HOME EXERCISE PROGRAM:  Access Code: [email or web address] URL: [email or web address] Date: 10/17/2023 Prepared by: Sidra Simpers  Exercises - Standing March with Counter Support  - 1 x daily - 3-4 x weekly - 3 sets - 10 reps - Standing Knee Flexion with Unilateral Counter Support  - 1 x daily - 7 x weekly - 3 sets - 10 reps - Standing Hip Extension with Unilateral Counter Support  - 1 x daily - 7 x weekly - 3 sets - 10 reps - Standing Hip Abduction with Unilateral Counter Support  - 1 x daily - 7 x weekly - 3 sets - 10 reps - Heel Toe Raises with Unilateral Counter Support  -  1 x daily - 7 x weekly - 3 sets - 10 reps  GOALS: Goals reviewed with patient? Yes  SHORT TERM GOALS: Target date: 11/15/2023  Pt will be independent with HEP in order to demonstrate increased ability to perform tasks related to occupation/hobbies. Baseline: pt given HEP at eval Goal status: INITIAL  LONG TERM GOALS: Target date: 01/10/2024  1.  Patient (> 71 years old) will complete five times sit to stand test in < 15 seconds indicating an increased LE strength and improved balance. Baseline: 24.36 sec Goal status: INITIAL  2.  Patient will improve SIS 16 score to 55   to demonstrate statistically significant improvement in mobility and quality of life as it relates to their CVA.  Baseline: 41 Goal status: INITIAL   3.  Patient will increase Berg Balance score by > 6 points to demonstrate decreased fall risk during functional activities. Baseline: 33/56 Goal status: INITIAL   4.  Patient will reduce timed up  and go to <11 seconds to reduce fall risk and demonstrate improved transfer/gait ability. Baseline: 23.94 sec with walker Goal status: INITIAL  5.  Patient will increase 10 meter walk test to >1.32m/s as to improve gait speed for better community ambulation and to reduce fall risk. Baseline: 20.33 sec with walker Goal status: INITIAL  6.  Patient will increase 2 minute walk test distance by 50 ft or greater for progression to community ambulator and demonstrate improved gait ability  Baseline: 275'; 83.82 m Goal status: INITIAL     ASSESSMENT:  CLINICAL IMPRESSION:  Patient presents with good motivation for completion of physical therapy activities.  Patient did well with all activities this date and when oxygen  was checked she remained above 90% with beginning of session and mid session following therapeutic activities being performed.  Will continue to monitor session to continue to progress patient as she is able.  Patient challenged with activities to improve her balance and confidence with mobility related activities.  During gait without assistive device between activities patient showed minimal foot clearance so she was challenged with activities in standing to promote foot clearance during session.  Pt will continue to benefit from skilled physical therapy intervention to address impairments, improve QOL, and attain therapy goals.    OBJECTIVE IMPAIRMENTS: Abnormal gait, decreased activity tolerance, decreased balance, decreased endurance, decreased knowledge of use of DME, decreased mobility, difficulty walking, decreased strength, impaired sensation, and pain.   ACTIVITY LIMITATIONS: carrying, lifting, bending, standing, squatting, stairs, transfers, bathing, toileting, dressing, hygiene/grooming, and locomotion level  PARTICIPATION LIMITATIONS: meal prep, cleaning, laundry, community activity, and yard work  PERSONAL FACTORS: Age and 3+ comorbidities:  PMH: of CVA, COPD,  Dysarthria, Ataxia, TIA, T2DM, Back pain, scoliosis are also affecting patient's functional outcome.   REHAB POTENTIAL: Good  CLINICAL DECISION MAKING: Evolving/moderate complexity  EVALUATION COMPLEXITY: Moderate  PLAN:  PT FREQUENCY: 2x/week  PT DURATION: 12 weeks  PLANNED INTERVENTIONS: 97750- Physical Performance Testing, 97110-Therapeutic exercises, 97530- Therapeutic activity, 97112- Neuromuscular re-education, 97535- Self Care, [id number]- Manual therapy, [phone]- Gait training, Balance training, and Vestibular training  PLAN FOR NEXT SESSION:    Monitor SPO2 and get portable oxygen  if necessary  Endurance, balance, strength ( L>R), updated HEP, gait training  -Limiting seated rest breaks as tolerated to challenge endurance  Note: Portions of this document were prepared using Dragon voice recognition software and although reviewed may contain unintentional dictation errors in syntax, grammar, or spelling.  02/27/2024, 2:00 PM  [name] PT ,DPT Physical  Therapist- [place]  [facility]     "

## 2024-02-27 NOTE — Therapy (Signed)
 " OUTPATIENT OCCUPATIONAL THERAPY NEURO TREATMENT NOTE  Patient Name: Hannah Kirby MRN: [id number] DOB:[birth year], 79 y.o., female Today's Date: 02/29/2024  PCP: Dr. [name] Perry REFERRING PROVIDER: Dr. [name] Perry  END OF SESSION:  OT End of Session - 02/29/24 0807     Visit Number 17    Number of Visits 24    Date for Recertification  04/18/24    OT Start Time 1450    OT Stop Time 1530    OT Time Calculation (min) 40 min    Equipment Utilized During Treatment transport chair    Activity Tolerance Patient tolerated treatment well    Behavior During Therapy WFL for tasks assessed/performed         Past Medical History:  Diagnosis Date   Acute hemorrhagic colitis 08/08/2019   Anemia    C. difficile colitis 08/09/2019   CAD (coronary artery disease)    a. 02/2006 PCI: BMS x 2 to RCA, cath o/w without significant coronary disease; b. nuclear stress test 07/2014: No ischemia/infarct; c. 11/2017 MV: no isch/infarct, EF 55-65%; d. 03/2020 NSTEMI/PCI: LM nl, LAD min irregs, RI 25, small, LCX nl, RCA 30p/m ISR, 99d (3.0x15 Resolute Onyx DES).   Cataract 02/04/2018   Cerebrovascular accident (CVA) (HCC) 03/18/2023   dysarthria   Chronic bronchitis (HCC)    secondary to cigarette smoking   FHx: allergies    Goiter    Granulomatous disease    Hernia    Kidney stone on left side 2013   NSTEMI (non-ST elevated myocardial infarction) (HCC) 03/23/2020   Oxygen  deficiency    Panic attacks    PVC's (premature ventricular contractions)    a. 03/2018 Zio: Occas PVCs (2.5%). Triggered events assoc w/ PVC/PAC.   Retinal detachment, left 03/04/2020   Retinal tear 2020   Stroke (HCC) 10/29/2016   mild left side weakness   Stroke (HCC) 08/01/2019   Tobacco abuse    Past Surgical History:  Procedure Laterality Date   ABDOMINAL HYSTERECTOMY     BREAST SURGERY     CATARACT EXTRACTION W/PHACO Right 03/01/2017   Procedure: CATARACT EXTRACTION PHACO AND INTRAOCULAR LENS PLACEMENT (IOC)  RIGHT DIABETIC;  Surgeon: Mittie Gaskin, MD;  Location: [facility];  Service: Ophthalmology;  Laterality: Right;   CATARACT EXTRACTION W/PHACO Left 03/22/2017   Procedure: CATARACT EXTRACTION PHACO AND INTRAOCULAR LENS PLACEMENT (IOC) LEFT DIABETIC;  Surgeon: Mittie Gaskin, MD;  Location: [facility];  Service: Ophthalmology;  Laterality: Left;  Diabetic - insulin  and oral meds   COLONOSCOPY WITH PROPOFOL  N/A 09/23/2014   Procedure: COLONOSCOPY WITH PROPOFOL ;  Surgeon: Gladis RAYMOND Mariner, MD;  Location: [facility] ENDOSCOPY;  Service: Endoscopy;  Laterality: N/A;   COLONOSCOPY WITH PROPOFOL  N/A 01/11/2018   Procedure: COLONOSCOPY WITH PROPOFOL ;  Surgeon: Mariner Gladis RAYMOND, MD;  Location: [facility] ENDOSCOPY;  Service: Endoscopy;  Laterality: N/A;   COLONOSCOPY WITH PROPOFOL  N/A 04/24/2018   Procedure: COLONOSCOPY WITH PROPOFOL ;  Surgeon: Mariner Gladis RAYMOND, MD;  Location: [facility] ENDOSCOPY;  Service: Endoscopy;  Laterality: N/A;   COLONOSCOPY WITH PROPOFOL  N/A 11/19/2019   Procedure: COLONOSCOPY WITH PROPOFOL ;  Surgeon: Jinny Carmine, MD;  Location: [facility] ENDOSCOPY;  Service: Endoscopy;  Laterality: N/A;   CORONARY ANGIOPLASTY WITH STENT PLACEMENT  2008   CORONARY STENT INTERVENTION N/A 03/24/2020   Procedure: CORONARY STENT INTERVENTION;  Surgeon: Mady Bruckner, MD;  Location: [facility] INVASIVE CV LAB;  Service: Cardiovascular;  Laterality: N/A;   ESOPHAGOGASTRODUODENOSCOPY (EGD) WITH PROPOFOL  N/A 12/30/2014   Procedure: ESOPHAGOGASTRODUODENOSCOPY (EGD) WITH PROPOFOL ;  Surgeon:  Gladis RAYMOND Mariner, MD;  Location: [facility] ENDOSCOPY;  Service: Endoscopy;  Laterality: N/A;   ESOPHAGOGASTRODUODENOSCOPY (EGD) WITH PROPOFOL  N/A 07/19/2016   Procedure: ESOPHAGOGASTRODUODENOSCOPY (EGD) WITH PROPOFOL ;  Surgeon: Mariner Gladis RAYMOND, MD;  Location: [facility] ENDOSCOPY;  Service: Endoscopy;  Laterality: N/A;   ESOPHAGOGASTRODUODENOSCOPY (EGD) WITH PROPOFOL  N/A 04/24/2018   Procedure:  ESOPHAGOGASTRODUODENOSCOPY (EGD) WITH PROPOFOL ;  Surgeon: Mariner Gladis RAYMOND, MD;  Location: [facility] ENDOSCOPY;  Service: Endoscopy;  Laterality: N/A;   EYE SURGERY  cataracts, detached retina   hysterectomy (other)     LEFT HEART CATH AND CORONARY ANGIOGRAPHY N/A 03/24/2020   Procedure: LEFT HEART CATH AND CORONARY ANGIOGRAPHY;  Surgeon: Mady Bruckner, MD;  Location: [facility] INVASIVE CV LAB;  Service: Cardiovascular;  Laterality: N/A;   Patient Active Problem List   Diagnosis Date Noted   Stroke (HCC) 08/05/2023   Depression with anxiety 08/05/2023   Overweight (BMI 25.0-29.9) 08/05/2023   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) 03/17/2023   Dizziness 02/23/2023   Restrictive airway disease 07/21/2020   Partial thickness rotator cuff tear 03/20/2020   Shoulder pain 03/05/2020   Ataxia 02/28/2020   Difficulty walking 02/28/2020   Physical deconditioning 02/28/2020   Chronic diastolic congestive heart failure (HCC) 11/20/2019   Polyp of ascending colon    Hypokalemia 08/08/2019   TIA (transient ischemic attack) 08/01/2019   Benign hypertensive kidney disease with chronic kidney disease 04/25/2019   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/24/2018   Chronic, continuous use of opioids 03/26/2018   History of adenomatous polyp of colon 01/22/2018   Diverticulosis of colon 01/22/2018   History of CVA (cerebrovascular accident) 11/08/2016   Weakness of left upper extremity 10/30/2016   AAA (abdominal aortic aneurysm) without rupture 08/12/2016   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   Stage 3b chronic kidney disease (HCC) 07/27/2015   Type 2 diabetes mellitus with diabetic chronic kidney disease (HCC) 10/28/2014   Solitary pulmonary nodule on lung CT 08/20/2014   Allergic rhinitis 07/28/2014   Acid reflux 07/28/2014   Chronic obstructive pulmonary disease (HCC) 07/28/2014   Granuloma annulare    Back pain 11/12/2013   Lumbar scoliosis 10/30/2013   Lumbar  radiculopathy 10/30/2013   Lumbar canal stenosis 10/30/2013   Proteinuria 10/22/2013   Smokes with greater than 30 pack year history 03/21/2011   Edema 08/18/2010   Hyperlipidemia 03/25/2009   Coronary artery disease 03/25/2009   Primary hypertension 03/25/2009   ONSET DATE: 08/05/2023  REFERRING DIAG: M70.101 (ICD-10-CM) - Weakness of left upper extremity   THERAPY DIAG:  Muscle weakness (generalized)  Other lack of coordination  Rationale for Evaluation and Treatment: Rehabilitation  SUBJECTIVE:  SUBJECTIVE STATEMENT: Daughter reports pt hasn't been feeling well over the last couple of weeks, feeling more tired and unsteady, but questions if pt felt fatigued from holiday visitors.  Spouse has been supervising mobility in the home, but daughter reports pt was doing better today and walking around more independently.   Pt accompanied by: family member: daughter, Harrie  PERTINENT HISTORY: Daughter present and provided hx.  Daughter reports pt has had 4 strokes, beginning in 2018, 2019, January of 2025 and the most recent in June of 2025.  Pt had been participating in [facility] since the most recent CVA, and is now eager to transition to outpatient services where PT/OT/SLP have been ordered. Per MEDICAL RECORD NUMBERPMH: of CVA, COPD, Dysarthria, Ataxia, TIA, T2DM, Back pain, scoliosis   PRECAUTIONS: Fall, Loop recorder for Afib  WEIGHT BEARING RESTRICTIONS: No  PAIN:  02/27/24:  4/10 back pain Are you having pain? Yes: NPRS scale: 3/10, can get up to 7-8/10 Pain location: low back and L hip Pain description: achy, sharp, dull Aggravating factors: prolonged standing  Relieving factors: sitting, pain meds, heat/ice   FALLS: Has patient fallen in last 6 months? Yes. Number of falls 1, tripping over cords  LIVING ENVIRONMENT: Lives with: lives with their spouse Lives in: 1 level home Stairs: Yes: External: 1 steps; none Has following equipment at home: walk in shower, comfort height toilet    PLOF: Prior to Jan of this year, pt was managing all home management tasks, was able to drive, and was caring for spouse   PATIENT GOALS: Strengthening the left arm   OBJECTIVE:  Note: Objective measures were completed at Evaluation unless otherwise noted.  HAND DOMINANCE: Right  ADLs: Overall ADLs: Daughter is pt's primary caregiver and lives near by Transfers/ambulation related to ADLs: occasional help with using walker at home, depending on balance on a given day Eating: indep/able to cut food  Grooming: bimanual grooming without difficulty UB Dressing: extra time for clothing fasteners, but able to gather clothing with RW LB Dressing: extra time with clothing fasteners Toileting: occasional help to walk to the bathroom, but modified indep within the bathroom for toileting tasks Bathing: direct supv for bathing using walkin in shower, shower chair, and grab bar; gets hair washed weekly at salon Tub Shower transfers: supv for walk in shower Equipment: RW, SBQC, shower chair, grab bars  IADLs: Shopping: daughter currently managing Light housekeeping: daughter currently managing; can start laundry on a good day.  Meal Prep: starting to participate in light meal prep, including making eggs on stove top Community mobility: daughter assists with all transportation; pt using transport chair to reach therapy gym today Medication management: daughter sets up pills 2 weeks at a time  Financial management: pt is participating in bill paying with daughter's assistance; most are online Handwriting: 100% legible  POSTURE COMMENTS:  rounded shoulders, forward head, increased thoracic kyphosis  ACTIVITY TOLERANCE: Activity tolerance: to be further assessed within functional contexts  FUNCTIONAL OUTCOME MEASURES: TBD  UPPER EXTREMITY ROM:  BUEs WFL for daily tasks  UPPER EXTREMITY MMT:  Hx of R rotator cuff tear 4-5 years ago with residual weakness, non-surgical;    MMT  Right eval Right 01/03/24 Left eval Left 01/03/24  Shoulder flexion 3+ 3+ 4 4  Shoulder abduction 3+ 3+ 4 4  Shoulder adduction      Shoulder extension      Shoulder internal rotation 3+ 3+ 4- 4  Shoulder external rotation 3+ 3+ 3+ 4-  Elbow flexion 5 5 5 5   Elbow extension 5 5 5 5   Wrist flexion 4+ 5 5 5   Wrist extension 4+ 5 5 5   Wrist ulnar deviation      Wrist radial deviation      Wrist pronation      Wrist supination      (Blank rows = not tested)   *Most recent CVA affecting L non-dominant side, with LUE presenting with increased strength as compared to dominant/unaffected arm.  Assume RUE weaker from hx of rotator cuff tear, or possibly previous CVAs.  Pt does present with mild LUE ataxia, and bilat hand weakness.  HAND FUNCTION: Grip strength: Right: 32 lbs; Left: 30 lbs, Lateral pinch: Right: 8 lbs, Left: 6 lbs, and 3 point pinch: Right: 7 lbs, Left: 6 lbs Grip strength: Right: 40 lbs; Left: 30 lbs, Lateral pinch: Right: 16 lbs, Left:  12; 3 point pinch: Right: 18 lbs, Left: 13 lbs  01/25/24: Right: 35 lbs; Left: 30 lbs, Lateral pinch: Right 7 lbs, Left: 5 lbs, and 3 point pinch: Right: 7 lbs, Left: 4 lbs   COORDINATION: Finger Nose Finger test: LUE mild ataxia  9 Hole Peg test: Right: 29 sec; Left: 47 sec 01/03/24: Right: 27 sec, Left: 37 sec  01/25/24: Right 27 sec, Left: 35 sec   SENSATION: Pt reports intermittent numbness in the R thumb, IF, and LF  EDEMA: No visible edema  MUSCLE TONE: RUE: Within functional limits and LUE: Within functional limits  COGNITION: Overall cognitive status: Impaired; see SLP eval for details  VISION: Pt reports L eye feels more blurry since first stroke in 2018; pt has reading glasses but doesn't usually wear them   VISION ASSESSMENT: To be further assessed in functional context Tracking/Visual pursuits: Able to track stimulus in all quads without difficulty Saccades: WFL Visual Fields: no apparent deficits Clock drawing  completed: all numbers spaced evenly, but pt left out the 1 and verbalized not knowing how to draw ten minutes to eleven when this direction was given.  Patient has difficulty with following activities due to following visual impairments: To be further assessed  PERCEPTION: To be further assessed  PRAXIS: Impaired: Motor planning  OBSERVATIONS: Pt pleasant, cooperative.  Daughter present and very supportive in pt's care.                                                                                                                       TREATMENT DATE:02/27/24:  Self Care: -Reviewed recommendations for laundry room set up to reduce fall risk and promote EC strategies: recommended chair be placed next to machines to sit and fold, keep detergents on top of machines to reduce extended reaching overhead, utilize reacher as needed for dropped articles of clothing and extended reach into machines.  Reviewed laundry transport strategies with basket vs mesh bag with use of rollator.  -Reviewed benefits of rollator at home for [facility], fall prevention, and easing item transport.  Reinforced need to bring rollator in to next session for OT to assess safety with pt's personal rollator.    -Reviewed strategies to engage pt into helping daughter to organize laundry room to begin implementing above noted strategies.   Therapeutic Activity: -Simulated clothing transport for laundry tasks using rollator.  Pt stuffed laundry bag with 3 pillows and used rollator to transport to another area of clinic, estimating home distance from bedroom to laundry room ~50 ft.  Pt simulated loading/unloading washer/dryer from laundry bag; min vc for walker positioning. -Practiced use of reacher to pick up dropped clothing articles vs reaching from standing with 1 hand on rollator for support; min vc to initiate use of reacher.   PATIENT EDUCATION: Education details: EC/laundry strategies Person educated: Patient and  Child(ren) Education method: Explanation, Demonstration, and Verbal cues Education comprehension: verbalized understanding, returned demonstration, verbal cues required, and needs further education  HOME EXERCISE PROGRAM: Yellow  theraputty  GOALS: Goals reviewed with patient? Yes  SHORT TERM GOALS: Target date: 03/07/24  Pt will perform HEP with min vc or less for improving LUE coordination and bilat hand strength. Baseline: Eval: HEP not yet initiated; 01/03/24: Pt acknowledges minimal participation in HEP in the home, but does have HEP in place with putty and coordination training activities; 01/25/24: see 01/03/24 Goal status: d/c/ not achieved  2.  Pt will be indep to verbalize and implement 2-3 fall prevention measures to reduce fall risk with ADLs. Baseline: Eval: Educ not yet initiated; 01/03/24: 1 fall last week after reaching to floor level to pick up soiled dog pad while dizzy; pt able to verbalize 3 fall prevention strategies with min vc; 01/25/24: pt uses countertop for support while bending/reaching/ambulating in kitchen, but is inconsistent with walker in the home   Goal status: in progress  LONG TERM GOALS: Target date: 04/18/24  Pt will perform shower transfers with modified indep Baseline: Eval: Direct supv; 01/03/24: direct supv from spouse, which pt plans to continue Goal status: d/c  2.  Pt will improve LUE GMC to enable reaching with good accuracy into overhead kitchen cabinets. Baseline: Eval: Mild LUE ataxia; 01/03/24: Residual mild LUE ataxia and pt denies attempts at reaching into kitchen cupboards d/t family managing meals; 01/25/24: Demonstrated today with good accuracy  Goal status: achieved  3.  Pt will improve L hand [facility] skills as demonstrated by 10 sec or more improvement in 9 hole peg test for improved efficiency when manipulating  clothing fasteners. Baseline: Eval: L 9 hole peg test: L 47 sec, R 29 sec; 01/03/24: L 37 sec, R 27 sec, indep with clothing  fasteners Goal status: achieved  4.  Pt will manage light loads of laundry with modified indep. Baseline: Eval: Daughter mostly manages, but pt can start laundry on a good day; 01/03/24: Pt can start laundry, and has been given recommendations for safe reaching strategies and item transport for increasing indep with this task, but has not yet attempted these strategies at home.  Pt continues to rely on family to manage laundry tasks; 01/25/24: Same as 01/03/24 as daughter reports she has not yet implemented recommendations for de-cluttering laundry room to allow space for a chair  Goal status: in progress  5. Pt will perform hot or cold light meal prep with distant supv and RW or rollator for at least 1 meal per day with reported RPE of 6 or less on modified Borg scale. Baseline: 01/03/24: Family currently manages meal preparation; 01/25/24: Pt has started to use microwave and occasionally stove top to reheat food, but verbalizes taxing effort; RPE 7/10 (vigorous activity) on modified Borg scale.   Goal status: Revised on 01/25/24      6.  Pt will verbalize and implement 1-2 EC strategies with min vc or less to enable light sweeping and mopping of kitchen floor with reported RPE of 6 or less on modified Borg scale.      Baseline: 01/25/24: RPE 7-8.  Often relies on daughter.     Goal status: New  ASSESSMENT: CLINICAL IMPRESSION: Pt/daughter receptive to all education provided this date, but daughter endorses no carry over yet with implementing strategies at home to organize laundry room for easier pt access, and daughter has not yet brought pt's rollator from daughter's home to pt's home for pt to start practice with daughter's supervision.  OT has recommended daughter bring pt's rollator to clinic for practice next session to allow OT to assess  pt's safety and use of brakes, as rollators tend to have varying levels of sturdiness with brakes.  Daughter agreed.  Pt continues to require intermittent  min vc for item transport strategies and implementing use of reacher to pick up hard to reach items by preventing an extended reach.  Pt demonstrated improved consistency with locking/unlocking rollator brakes during simulated IADL tasks, and increased safety with sit to stand from rollator, though still requiring close SBA to reduce fall risk.  Pt continues to demonstrate low activity tolerance, requiring intermittent sitting breaks during IADL simulation, further reinforcing benefit of using rollator in the home.  Pt/daughter verbalized understanding of recommendations.  Pt continues to benefit from skilled OT to maximize indep, safety, and efficiency with IADL tasks.     PERFORMANCE DEFICITS: in functional skills including ADLs, IADLs, coordination, dexterity, sensation, ROM, strength, pain, Fine motor control, Gross motor control, mobility, balance, body mechanics, endurance, decreased knowledge of precautions, decreased knowledge of use of DME, vision, and UE functional use, cognitive skills including emotional, perception, problem solving, safety awareness, temperament/personality, and understand, and psychosocial skills including coping strategies, environmental adaptation, and routines and behaviors.   IMPAIRMENTS: are limiting patient from ADLs, IADLs, leisure, and social participation.   CO-MORBIDITIES: has co-morbidities such as afib, AAA, hx of multiple CVAs that affects occupational performance. Patient will benefit from skilled OT to address above impairments and improve overall function.  MODIFICATION OR ASSISTANCE TO COMPLETE EVALUATION: Min-Moderate modification of tasks or assist with assess necessary to complete an evaluation.  OT OCCUPATIONAL PROFILE AND HISTORY: Detailed assessment: Review of records and additional review of physical, cognitive, psychosocial history related to current functional performance.  CLINICAL DECISION MAKING: Moderate - several treatment options, min-mod  task modification necessary  REHAB POTENTIAL: Good  EVALUATION COMPLEXITY: Moderate    PLAN:  OT FREQUENCY: 2x/week  OT DURATION: 12 weeks  PLANNED INTERVENTIONS: 97168 OT Re-evaluation, 97535 self care/ADL training, 02889 therapeutic exercise, 97530 therapeutic activity, 97112 neuromuscular re-education, 97140 manual therapy, 97116 gait training, 02989 moist heat, 97010 cryotherapy, 97129 Cognitive training (first 15 min), 02249 Physical Performance Testing, balance training, functional mobility training, visual/perceptual remediation/compensation, psychosocial skills training, energy conservation, coping strategies training, patient/family education, and DME and/or AE instructions  RECOMMENDED OTHER SERVICES: none at this time (OT/PT/SLP services have all been ordered)  CONSULTED AND AGREED WITH PLAN OF CARE: Patient and family member/caregiver  PLAN FOR NEXT SESSION: see above  Inocente Blazing, MS, OTR/L  02/29/2024, 8:19 AM     "

## 2024-02-27 NOTE — Therapy (Signed)
 " OUTPATIENT SPEECH LANGUAGE PATHOLOGY APHASIA TREATMENT / PROGRESS NOTE / RE-CERTIFICATION    Patient Name: Hannah Kirby MRN: [id number] DOB:[birth year], 79 y.o., female Today's Date: 02/27/2024  PCP: [name] Perry, MD REFERRING PROVIDER: [name] Perry, MD  Speech Therapy Progress Note  Dates of Reporting Period: 11/23/23 to 02/27/24  Objective: Patient has been seen for 10 speech therapy sessions this reporting period targeting speech/language. Patient is making progress toward LTGs and met 1/2 STGs this reporting period. See skilled intervention, clinical impressions, and goals below for details.    End of Session - 02/27/24 1451     Visit Number 20    Number of Visits 37    Date for Recertification  04/09/24    Progress Note Due on Visit 30    SLP Start Time 1400    SLP Stop Time  1445    SLP Time Calculation (min) 45 min          Past Medical History:  Diagnosis Date   Acute hemorrhagic colitis 08/08/2019   Anemia    C. difficile colitis 08/09/2019   CAD (coronary artery disease)    a. 02/2006 PCI: BMS x 2 to RCA, cath o/w without significant coronary disease; b. nuclear stress test 07/2014: No ischemia/infarct; c. 11/2017 MV: no isch/infarct, EF 55-65%; d. 03/2020 NSTEMI/PCI: LM nl, LAD min irregs, RI 25, small, LCX nl, RCA 30p/m ISR, 99d (3.0x15 Resolute Onyx DES).   Cataract 02/04/2018   Cerebrovascular accident (CVA) (HCC) 03/18/2023   dysarthria   Chronic bronchitis (HCC)    secondary to cigarette smoking   FHx: allergies    Goiter    Granulomatous disease    Hernia    Kidney stone on left side 2013   NSTEMI (non-ST elevated myocardial infarction) (HCC) 03/23/2020   Oxygen  deficiency    Panic attacks    PVC's (premature ventricular contractions)    a. 03/2018 Zio: Occas PVCs (2.5%). Triggered events assoc w/ PVC/PAC.   Retinal detachment, left 03/04/2020   Retinal tear 2020   Stroke (HCC) 10/29/2016   mild left side weakness   Stroke (HCC) 08/01/2019    Tobacco abuse    Past Surgical History:  Procedure Laterality Date   ABDOMINAL HYSTERECTOMY     BREAST SURGERY     CATARACT EXTRACTION W/PHACO Right 03/01/2017   Procedure: CATARACT EXTRACTION PHACO AND INTRAOCULAR LENS PLACEMENT (IOC) RIGHT DIABETIC;  Surgeon: Mittie Gaskin, MD;  Location: [facility];  Service: Ophthalmology;  Laterality: Right;   CATARACT EXTRACTION W/PHACO Left 03/22/2017   Procedure: CATARACT EXTRACTION PHACO AND INTRAOCULAR LENS PLACEMENT (IOC) LEFT DIABETIC;  Surgeon: Mittie Gaskin, MD;  Location: [facility];  Service: Ophthalmology;  Laterality: Left;  Diabetic - insulin  and oral meds   COLONOSCOPY WITH PROPOFOL  N/A 09/23/2014   Procedure: COLONOSCOPY WITH PROPOFOL ;  Surgeon: Gladis RAYMOND Mariner, MD;  Location: [facility] ENDOSCOPY;  Service: Endoscopy;  Laterality: N/A;   COLONOSCOPY WITH PROPOFOL  N/A 01/11/2018   Procedure: COLONOSCOPY WITH PROPOFOL ;  Surgeon: Mariner Gladis RAYMOND, MD;  Location: [facility] ENDOSCOPY;  Service: Endoscopy;  Laterality: N/A;   COLONOSCOPY WITH PROPOFOL  N/A 04/24/2018   Procedure: COLONOSCOPY WITH PROPOFOL ;  Surgeon: Mariner Gladis RAYMOND, MD;  Location: [facility] ENDOSCOPY;  Service: Endoscopy;  Laterality: N/A;   COLONOSCOPY WITH PROPOFOL  N/A 11/19/2019   Procedure: COLONOSCOPY WITH PROPOFOL ;  Surgeon: Jinny Carmine, MD;  Location: [facility] ENDOSCOPY;  Service: Endoscopy;  Laterality: N/A;   CORONARY ANGIOPLASTY WITH STENT PLACEMENT  2008   CORONARY STENT INTERVENTION N/A 03/24/2020  Procedure: CORONARY STENT INTERVENTION;  Surgeon: Mady Bruckner, MD;  Location: [facility] INVASIVE CV LAB;  Service: Cardiovascular;  Laterality: N/A;   ESOPHAGOGASTRODUODENOSCOPY (EGD) WITH PROPOFOL  N/A 12/30/2014   Procedure: ESOPHAGOGASTRODUODENOSCOPY (EGD) WITH PROPOFOL ;  Surgeon: Gladis RAYMOND Mariner, MD;  Location: [facility] ENDOSCOPY;  Service: Endoscopy;  Laterality: N/A;   ESOPHAGOGASTRODUODENOSCOPY (EGD) WITH PROPOFOL  N/A 07/19/2016   Procedure:  ESOPHAGOGASTRODUODENOSCOPY (EGD) WITH PROPOFOL ;  Surgeon: Mariner Gladis RAYMOND, MD;  Location: [facility] ENDOSCOPY;  Service: Endoscopy;  Laterality: N/A;   ESOPHAGOGASTRODUODENOSCOPY (EGD) WITH PROPOFOL  N/A 04/24/2018   Procedure: ESOPHAGOGASTRODUODENOSCOPY (EGD) WITH PROPOFOL ;  Surgeon: Mariner Gladis RAYMOND, MD;  Location: [facility] ENDOSCOPY;  Service: Endoscopy;  Laterality: N/A;   EYE SURGERY  cataracts, detached retina   hysterectomy (other)     LEFT HEART CATH AND CORONARY ANGIOGRAPHY N/A 03/24/2020   Procedure: LEFT HEART CATH AND CORONARY ANGIOGRAPHY;  Surgeon: Mady Bruckner, MD;  Location: [facility] INVASIVE CV LAB;  Service: Cardiovascular;  Laterality: N/A;   Patient Active Problem List   Diagnosis Date Noted   Stroke (HCC) 08/05/2023   Depression with anxiety 08/05/2023   Overweight (BMI 25.0-29.9) 08/05/2023   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) 03/17/2023   Dizziness 02/23/2023   Restrictive airway disease 07/21/2020   Partial thickness rotator cuff tear 03/20/2020   Shoulder pain 03/05/2020   Ataxia 02/28/2020   Difficulty walking 02/28/2020   Physical deconditioning 02/28/2020   Chronic diastolic congestive heart failure (HCC) 11/20/2019   Polyp of ascending colon    Hypokalemia 08/08/2019   TIA (transient ischemic attack) 08/01/2019   Benign hypertensive kidney disease with chronic kidney disease 04/25/2019   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/24/2018   Chronic, continuous use of opioids 03/26/2018   History of adenomatous polyp of colon 01/22/2018   Diverticulosis of colon 01/22/2018   History of CVA (cerebrovascular accident) 11/08/2016   Weakness of left upper extremity 10/30/2016   AAA (abdominal aortic aneurysm) without rupture 08/12/2016   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   Stage 3b chronic kidney disease (HCC) 07/27/2015   Type 2 diabetes mellitus with diabetic chronic kidney disease (HCC) 10/28/2014   Solitary pulmonary nodule  on lung CT 08/20/2014   Allergic rhinitis 07/28/2014   Acid reflux 07/28/2014   Chronic obstructive pulmonary disease (HCC) 07/28/2014   Granuloma annulare    Back pain 11/12/2013   Lumbar scoliosis 10/30/2013   Lumbar radiculopathy 10/30/2013   Lumbar canal stenosis 10/30/2013   Proteinuria 10/22/2013   Smokes with greater than 30 pack year history 03/21/2011   Edema 08/18/2010   Hyperlipidemia 03/25/2009   Coronary artery disease 03/25/2009   Primary hypertension 03/25/2009    ONSET DATE: 03/17/23 (admitted with stroke), 05/23/23 (referral date)  REFERRING DIAG:  R41.841 (ICD-10-CM) - Cognitive communication deficit  I63.9 (ICD-10-CM) - CVA (cerebral vascular accident) (HCC)  R47.02 (ICD-10-CM) - Dysphasia    THERAPY DIAG:  Aphasia  Apraxia of speech  Rationale for Evaluation and Treatment Rehabilitation  SUBJECTIVE:   SUBJECTIVE STATEMENT: Pt alert, pleasant, and cooperative.  Pt accompanied by: self and family member  PERTINENT HISTORY:  Pt is a 79 y.o. female who presents for communication evaluation in setting on stroke. Pt in ED 1/24-1/25/25. Pt d/c'd home with HH ST. PMHx significant of   HTN, CKD stage IIIB, DMT2 on insulin , CAD, chronic smoking, chronic respiratory failure with COPD and on home oxygen , anxiety, HLD. MRI 03/17/23 1. Punctate foci of restricted diffusion in the left posterior  frontal white matter, most likely acute infarcts.  2. Numerous foci of hemosiderin deposition, which are seen in the  deep gray structures but also in the bilateral cerebral hemispheres,  with 1 new focus compared to 02/20/2023. While this could be the  sequela of chronic hypertensive microhemorrhages, this appearance is  concerning for cerebral amyloid angiopathy.   PAIN:  Are you having pain? No  FALLS: defer to PT  LIVING ENVIRONMENT: Lives with: lives with their spouse Lives in: House/apartment  PLOF:  Level of assistance: Independent with ADLs Employment:  Retired   PATIENT GOALS    for communication to improve   OBJECTIVE:  TODAY'S TREATMENT: Pt's last ST session was 02/01/24. Pt missed several sessions due to illness (feeling off, balance issues; daughter illness). Brief re-evaluation and current goals seem appropriate. 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  Given difficulty with attendance, pt agreeable to changing frequency to 1-2x week for 6 weeks on a trial basis with agreement that pt would attend >50% of sessions.   Reviewed SFA which pt required mod/max cues to utilize during structured tasks. Pt given handout for HEP for practice.   Pt able to generate x5 sentences with 80% accuracy. Paraphasic errors persist, but sentences were >3 words.  Reviewed activities to promote cognitive-communication. Pt endorsed completing some light cooking/cleaning and doing laundry once in the last few weeks. Pt tasked with completing a cognitively based activity at least 3x week. Pt and daughter well versed  in what to do to promote cognition; however, follow through continues to be a problem.   Reviewed script training including its rationale/purpose. Co-generated script with pt for conversation with pt's sister and dog last session.  Pt stated she has been rehearsing scripts and would like to continue.      PATIENT EDUCATION: Education details: as above Person educated: Patient and Child Education method: Explanation Education comprehension: verbalized understanding and needs further education  HOME EXERCISE PROGRAM:    Practice scripts  SFA homework  To do list with x1 cognitive task per day    GOALS:  Goals reviewed with patient? Yes  SHORT TERM GOALS: Target date: 10 sessions  Pt will participate in further assessment of functional reading/writing. Baseline: Goal status: MET  2.  With Moderate A, patient will  complete a semantic feature analysis with at least 2 relevant features for 8/10 target words to improve word-finding skills.  Baseline:  Goal status: PROGRESSING; continue goal on 01/11/24  3.  With Moderate A, patient will generate sentences with 3 or more words in response to a situation at 80% accuracy in order to increase ability to communicate basic wants and needs.  Baseline:  Goal status: MET at max cues; changed to mod cues on 02/27/24  4.   With Maximal A,  pt will follow 2-step commands for improved participation in ADLs/iADLs with max cueing. Baseline:  Goal status: MET  5.  Pt will repeat short sentences (<4 words) with good approximations 80% of the time with max cueing. Baseline:  Goal status: MET  New goal, 02/27/24 6. Pt will complete a cognitively based activity >3x week to promote cognitive-communication and life participation.   Baseline:  Goal status: INITIAL  7. Pt will utilize scripts with no more than min cues to improve communication success. Baseline: Goal status: INITIAL      LONG TERM GOALS: Target date: 4 weeks; 02/08/24; goal continued until 03/30/24  Pt will report a subjective improvement in communication  per PROM.  Baseline: CES 12/32 on 8/26 Goal status: INITIAL; continue goal  2.  With Min A, patient/family will demonstrate understanding of the following concepts: aphasia, spontaneous recovery, communication vs conversation, strengths/strategies to promote success, local resources in order to increase patient's participation in medical care.    Baseline:  Goal status: MET  3. Pt and/or daughter will demonstrate understanding of multiple ways to stimulate pt cognitively outside of ST.   Baseline:  Goal status: CONTINUE; progressing   ASSESSMENT:  CLINICAL IMPRESSION: Pt is a 79 y.o. female who presents for communication treatment in setting on stroke. Pt known to SLP services and was receiving OP ST prior to most recent stroke 08/05/23. Pt  d/c'd home with [facility] ST who is working on securing SGD for pt. PMHx significant of  HTN, CKD stage IIIB, DMT2 on insulin , CAD, chronic smoking, chronic respiratory failure with COPD and on home oxygen , anxiety, HLD. Re-assessment completed 01/10/34 via formal assessment Western Aphasia Battery Revised (WAB-R). 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 Noted improvement in pt's auditory comprehension and repetition; however, difficulty with spontaneous speech and naming/wordfinding persist. Course of therapy, thus far this episode, has be limited by pt's illness with patient missing several scheduled sessions and pt's limited support/cognitive stimulation at home. Recommend continued course of skilled ST on a trial basis for the next 6 weeks with pt in agreement to complete home assignments and attend therapy sessions regularly (>50% of the time). See details of tx session above. Recommend course of ST targeting functional communication, further assessment of functional reading/writing, and pt/caregiver training to help promote QoL and overall life participation.   OBJECTIVE IMPAIRMENTS include expressive language, receptive language, and aphasia. These impairments are limiting patient from ADLs/IADLs and effectively communicating at home and in community. Factors affecting potential to achieve goals and functional outcome are severity of impairments. Patient will benefit from skilled SLP services to address above impairments and improve overall function.  REHAB POTENTIAL: Good  PLAN: SLP FREQUENCY: 1-2x/week  SLP DURATION: 6 weeks  PLANNED INTERVENTIONS: Language facilitation, Cueing hierachy, Internal/external aids, Functional tasks, Multimodal communication approach, SLP instruction and feedback, Compensatory strategies, and Patient/family education    Delon Bangs, M.S.,  CCC-SLP Speech-Language Pathologist [facility] [phone] [place])  [facility] at [facility] [address] [place], [place], [address] Phone: [phone]   Fax:  [phone] "

## 2024-02-28 NOTE — Progress Notes (Signed)
 Remote Loop Recorder Transmission

## 2024-02-29 ENCOUNTER — Ambulatory Visit

## 2024-02-29 ENCOUNTER — Ambulatory Visit: Admitting: Occupational Therapy

## 2024-02-29 ENCOUNTER — Ambulatory Visit: Admitting: Physical Therapy

## 2024-02-29 DIAGNOSIS — R4701 Aphasia: Secondary | ICD-10-CM

## 2024-02-29 DIAGNOSIS — M6281 Muscle weakness (generalized): Secondary | ICD-10-CM

## 2024-02-29 DIAGNOSIS — R2689 Other abnormalities of gait and mobility: Secondary | ICD-10-CM

## 2024-02-29 DIAGNOSIS — R2681 Unsteadiness on feet: Secondary | ICD-10-CM

## 2024-02-29 DIAGNOSIS — R482 Apraxia: Secondary | ICD-10-CM

## 2024-02-29 DIAGNOSIS — R269 Unspecified abnormalities of gait and mobility: Secondary | ICD-10-CM

## 2024-02-29 DIAGNOSIS — R262 Difficulty in walking, not elsewhere classified: Secondary | ICD-10-CM

## 2024-02-29 NOTE — Therapy (Signed)
 " OUTPATIENT SPEECH LANGUAGE PATHOLOGY APHASIA TREATMENT / PROGRESS NOTE / RE-CERTIFICATION    Patient Name: Hannah Kirby MRN: [id number] DOB:05-18-1945, 79 y.o., female Today's Date: 02/29/2024  PCP: [name] Perry, MD REFERRING PROVIDER: [name] Perry, MD  Speech Therapy Progress Note  Dates of Reporting Period: 11/23/23 to 02/27/24  Objective: Patient has been seen for 10 speech therapy sessions this reporting period targeting speech/language. Patient is making progress toward LTGs and met 1/2 STGs this reporting period. See skilled intervention, clinical impressions, and goals below for details.    End of Session - 02/29/24 1624     Visit Number 21    Number of Visits 37    Date for Recertification  04/09/24    Progress Note Due on Visit 30    SLP Start Time 1445    SLP Stop Time  1530    SLP Time Calculation (min) 45 min    Activity Tolerance Patient tolerated treatment well          Past Medical History:  Diagnosis Date   Acute hemorrhagic colitis 08/08/2019   Anemia    C. difficile colitis 08/09/2019   CAD (coronary artery disease)    a. 02/2006 PCI: BMS x 2 to RCA, cath o/w without significant coronary disease; b. nuclear stress test 07/2014: No ischemia/infarct; c. 11/2017 MV: no isch/infarct, EF 55-65%; d. 03/2020 NSTEMI/PCI: LM nl, LAD min irregs, RI 25, small, LCX nl, RCA 30p/m ISR, 99d (3.0x15 Resolute Onyx DES).   Cataract 02/04/2018   Cerebrovascular accident (CVA) (HCC) 03/18/2023   dysarthria   Chronic bronchitis (HCC)    secondary to cigarette smoking   FHx: allergies    Goiter    Granulomatous disease    Hernia    Kidney stone on left side 2013   NSTEMI (non-ST elevated myocardial infarction) (HCC) 03/23/2020   Oxygen  deficiency    Panic attacks    PVC's (premature ventricular contractions)    a. 03/2018 Zio: Occas PVCs (2.5%). Triggered events assoc w/ PVC/PAC.   Retinal detachment, left 03/04/2020   Retinal tear 2020   Stroke (HCC) 10/29/2016    mild left side weakness   Stroke (HCC) 08/01/2019   Tobacco abuse    Past Surgical History:  Procedure Laterality Date   ABDOMINAL HYSTERECTOMY     BREAST SURGERY     CATARACT EXTRACTION W/PHACO Right 03/01/2017   Procedure: CATARACT EXTRACTION PHACO AND INTRAOCULAR LENS PLACEMENT (IOC) RIGHT DIABETIC;  Surgeon: Mittie Gaskin, MD;  Location: [facility];  Service: Ophthalmology;  Laterality: Right;   CATARACT EXTRACTION W/PHACO Left 03/22/2017   Procedure: CATARACT EXTRACTION PHACO AND INTRAOCULAR LENS PLACEMENT (IOC) LEFT DIABETIC;  Surgeon: Mittie Gaskin, MD;  Location: [facility];  Service: Ophthalmology;  Laterality: Left;  Diabetic - insulin  and oral meds   COLONOSCOPY WITH PROPOFOL  N/A 09/23/2014   Procedure: COLONOSCOPY WITH PROPOFOL ;  Surgeon: Gladis RAYMOND Mariner, MD;  Location: [facility] ENDOSCOPY;  Service: Endoscopy;  Laterality: N/A;   COLONOSCOPY WITH PROPOFOL  N/A 01/11/2018   Procedure: COLONOSCOPY WITH PROPOFOL ;  Surgeon: Mariner Gladis RAYMOND, MD;  Location: [facility] ENDOSCOPY;  Service: Endoscopy;  Laterality: N/A;   COLONOSCOPY WITH PROPOFOL  N/A 04/24/2018   Procedure: COLONOSCOPY WITH PROPOFOL ;  Surgeon: Mariner Gladis RAYMOND, MD;  Location: [facility] ENDOSCOPY;  Service: Endoscopy;  Laterality: N/A;   COLONOSCOPY WITH PROPOFOL  N/A 11/19/2019   Procedure: COLONOSCOPY WITH PROPOFOL ;  Surgeon: Jinny Carmine, MD;  Location: [facility] ENDOSCOPY;  Service: Endoscopy;  Laterality: N/A;   CORONARY ANGIOPLASTY WITH STENT PLACEMENT  2008   CORONARY STENT INTERVENTION N/A 03/24/2020   Procedure: CORONARY STENT INTERVENTION;  Surgeon: Mady Bruckner, MD;  Location: [facility] INVASIVE CV LAB;  Service: Cardiovascular;  Laterality: N/A;   ESOPHAGOGASTRODUODENOSCOPY (EGD) WITH PROPOFOL  N/A 12/30/2014   Procedure: ESOPHAGOGASTRODUODENOSCOPY (EGD) WITH PROPOFOL ;  Surgeon: Gladis RAYMOND Mariner, MD;  Location: [facility] ENDOSCOPY;  Service: Endoscopy;  Laterality: N/A;   ESOPHAGOGASTRODUODENOSCOPY  (EGD) WITH PROPOFOL  N/A 07/19/2016   Procedure: ESOPHAGOGASTRODUODENOSCOPY (EGD) WITH PROPOFOL ;  Surgeon: Mariner Gladis RAYMOND, MD;  Location: [facility] ENDOSCOPY;  Service: Endoscopy;  Laterality: N/A;   ESOPHAGOGASTRODUODENOSCOPY (EGD) WITH PROPOFOL  N/A 04/24/2018   Procedure: ESOPHAGOGASTRODUODENOSCOPY (EGD) WITH PROPOFOL ;  Surgeon: Mariner Gladis RAYMOND, MD;  Location: [facility] ENDOSCOPY;  Service: Endoscopy;  Laterality: N/A;   EYE SURGERY  cataracts, detached retina   hysterectomy (other)     LEFT HEART CATH AND CORONARY ANGIOGRAPHY N/A 03/24/2020   Procedure: LEFT HEART CATH AND CORONARY ANGIOGRAPHY;  Surgeon: Mady Bruckner, MD;  Location: [facility] INVASIVE CV LAB;  Service: Cardiovascular;  Laterality: N/A;   Patient Active Problem List   Diagnosis Date Noted   Stroke (HCC) 08/05/2023   Depression with anxiety 08/05/2023   Overweight (BMI 25.0-29.9) 08/05/2023   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) 03/17/2023   Dizziness 02/23/2023   Restrictive airway disease 07/21/2020   Partial thickness rotator cuff tear 03/20/2020   Shoulder pain 03/05/2020   Ataxia 02/28/2020   Difficulty walking 02/28/2020   Physical deconditioning 02/28/2020   Chronic diastolic congestive heart failure (HCC) 11/20/2019   Polyp of ascending colon    Hypokalemia 08/08/2019   TIA (transient ischemic attack) 08/01/2019   Benign hypertensive kidney disease with chronic kidney disease 04/25/2019   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/24/2018   Chronic, continuous use of opioids 03/26/2018   History of adenomatous polyp of colon 01/22/2018   Diverticulosis of colon 01/22/2018   History of CVA (cerebrovascular accident) 11/08/2016   Weakness of left upper extremity 10/30/2016   AAA (abdominal aortic aneurysm) without rupture 08/12/2016   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   Stage 3b chronic kidney disease (HCC) 07/27/2015   Type 2 diabetes mellitus with diabetic chronic kidney  disease (HCC) 10/28/2014   Solitary pulmonary nodule on lung CT 08/20/2014   Allergic rhinitis 07/28/2014   Acid reflux 07/28/2014   Chronic obstructive pulmonary disease (HCC) 07/28/2014   Granuloma annulare    Back pain 11/12/2013   Lumbar scoliosis 10/30/2013   Lumbar radiculopathy 10/30/2013   Lumbar canal stenosis 10/30/2013   Proteinuria 10/22/2013   Smokes with greater than 30 pack year history 03/21/2011   Edema 08/18/2010   Hyperlipidemia 03/25/2009   Coronary artery disease 03/25/2009   Primary hypertension 03/25/2009    ONSET DATE: 03/17/23 (admitted with stroke), 05/23/23 (referral date)  REFERRING DIAG:  R41.841 (ICD-10-CM) - Cognitive communication deficit  I63.9 (ICD-10-CM) - CVA (cerebral vascular accident) (HCC)  R47.02 (ICD-10-CM) - Dysphasia    THERAPY DIAG:  Aphasia  Apraxia of speech  Rationale for Evaluation and Treatment Rehabilitation  SUBJECTIVE:   SUBJECTIVE STATEMENT: Pt alert, pleasant, and cooperative.  Pt accompanied by: self and family member  PERTINENT HISTORY:  Pt is a 79 y.o. female who presents for communication evaluation in setting on stroke. Pt in ED 1/24-1/25/25. Pt d/c'd home with HH ST. PMHx significant of   HTN, CKD stage IIIB, DMT2 on insulin , CAD, chronic smoking, chronic respiratory failure with COPD and on home oxygen , anxiety, HLD. MRI 03/17/23 1. Punctate foci of restricted diffusion in the left  posterior  frontal white matter, most likely acute infarcts.  2. Numerous foci of hemosiderin deposition, which are seen in the  deep gray structures but also in the bilateral cerebral hemispheres,  with 1 new focus compared to 02/20/2023. While this could be the  sequela of chronic hypertensive microhemorrhages, this appearance is  concerning for cerebral amyloid angiopathy.   PAIN:  Are you having pain? No  FALLS: defer to PT  LIVING ENVIRONMENT: Lives with: lives with their spouse Lives in: House/apartment  PLOF:   Level of assistance: Independent with ADLs Employment: Retired   PATIENT GOALS    for communication to improve   OBJECTIVE:  TODAY'S TREATMENT: Reviewed publishing rights manager including its rationale/purpose. Co-generated scripts with pt for medical appointments including interactions with receptionist, nursing staff, and MD. With rehearsal and script training, pt able to answer questions re: medical appointments with >80% accuracy. Pt tasked with practicing scripts daily.      PATIENT EDUCATION: Education details: as above Person educated: Patient and Child Education method: Explanation Education comprehension: verbalized understanding and needs further education  HOME EXERCISE PROGRAM:    Practice scripts  SFA homework  To do list with x1 cognitive task per day    GOALS:  Goals reviewed with patient? Yes  SHORT TERM GOALS: Target date: 10 sessions  Pt will participate in further assessment of functional reading/writing. Baseline: Goal status: MET  2.  With Moderate A, patient will complete a semantic feature analysis with at least 2 relevant features for 8/10 target words to improve word-finding skills.  Baseline:  Goal status: PROGRESSING; continue goal on 01/11/24  3.  With Moderate A, patient will generate sentences with 3 or more words in response to a situation at 80% accuracy in order to increase ability to communicate basic wants and needs.  Baseline:  Goal status: MET at max cues; changed to mod cues on 02/27/24  4.   With Maximal A,  pt will follow 2-step commands for improved participation in ADLs/iADLs with max cueing. Baseline:  Goal status: MET  5.  Pt will repeat short sentences (<4 words) with good approximations 80% of the time with max cueing. Baseline:  Goal status: MET  New goal, 02/27/24 6. Pt will complete a cognitively based activity >3x week to promote cognitive-communication and life participation.   Baseline:  Goal status: INITIAL  7. Pt  will utilize scripts with no more than min cues to improve communication success. Baseline: Goal status: INITIAL      LONG TERM GOALS: Target date: 4 weeks; 02/08/24; goal continued until 03/30/24  Pt will report a subjective improvement in communication per PROM.  Baseline: CES 12/32 on 8/26 Goal status: INITIAL; continue goal  2.  With Min A, patient/family will demonstrate understanding of the following concepts: aphasia, spontaneous recovery, communication vs conversation, strengths/strategies to promote success, local resources in order to increase patient's participation in medical care.    Baseline:  Goal status: MET  3. Pt and/or daughter will demonstrate understanding of multiple ways to stimulate pt cognitively outside of ST.   Baseline:  Goal status: CONTINUE; progressing   ASSESSMENT:  CLINICAL IMPRESSION: Pt is a 79 y.o. female who presents for communication treatment in setting on stroke. Pt known to SLP services and was receiving OP ST prior to most recent stroke 08/05/23. Pt d/c'd home with [facility] ST who is working on securing SGD for pt. PMHx significant of  HTN, CKD stage IIIB, DMT2 on insulin , CAD, chronic smoking, chronic respiratory failure  with COPD and on home oxygen , anxiety, HLD. Re-assessment completed 01/10/34 via formal assessment Western Aphasia Battery Revised (WAB-R). 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 Noted improvement in pt's auditory comprehension and repetition; however, difficulty with spontaneous speech and naming/wordfinding persist. Course of therapy, thus far this episode, has be limited by pt's illness with patient missing several scheduled sessions and pt's limited support/cognitive stimulation at home. Recommend continued course of skilled ST on a trial basis for the next 6 weeks with pt in agreement to complete home assignments and attend  therapy sessions regularly (>50% of the time). See details of tx session above. Recommend course of ST targeting functional communication, further assessment of functional reading/writing, and pt/caregiver training to help promote QoL and overall life participation.   OBJECTIVE IMPAIRMENTS include expressive language, receptive language, and aphasia. These impairments are limiting patient from ADLs/IADLs and effectively communicating at home and in community. Factors affecting potential to achieve goals and functional outcome are severity of impairments. Patient will benefit from skilled SLP services to address above impairments and improve overall function.  REHAB POTENTIAL: Good  PLAN: SLP FREQUENCY: 1-2x/week  SLP DURATION: 6 weeks  PLANNED INTERVENTIONS: Language facilitation, Cueing hierachy, Internal/external aids, Functional tasks, Multimodal communication approach, SLP instruction and feedback, Compensatory strategies, and Patient/family education    Delon Bangs, M.S., CCC-SLP Speech-Language Pathologist [facility] [phone] [place])  [facility] at [facility] [address] [place], [place], [address] Phone: [phone]   Fax:  [phone] "

## 2024-02-29 NOTE — Therapy (Signed)
 " OUTPATIENT OCCUPATIONAL THERAPY NEURO TREATMENT NOTE  Patient Name: Hannah Kirby MRN: [id number] DOB:Dec 07, 1945, 79 y.o., female Today's Date: 02/29/2024  PCP: Dr. [name] Perry REFERRING PROVIDER: Dr. [name] Perry  END OF SESSION:  OT End of Session - 02/29/24 2234     Visit Number 18    Number of Visits 24    Date for Recertification  04/18/24    OT Start Time 1400    OT Stop Time 1445    OT Time Calculation (min) 45 min    Equipment Utilized During Treatment transport chair    Activity Tolerance Patient tolerated treatment well    Behavior During Therapy WFL for tasks assessed/performed         Past Medical History:  Diagnosis Date   Acute hemorrhagic colitis 08/08/2019   Anemia    C. difficile colitis 08/09/2019   CAD (coronary artery disease)    a. 02/2006 PCI: BMS x 2 to RCA, cath o/w without significant coronary disease; b. nuclear stress test 07/2014: No ischemia/infarct; c. 11/2017 MV: no isch/infarct, EF 55-65%; d. 03/2020 NSTEMI/PCI: LM nl, LAD min irregs, RI 25, small, LCX nl, RCA 30p/m ISR, 99d (3.0x15 Resolute Onyx DES).   Cataract 02/04/2018   Cerebrovascular accident (CVA) (HCC) 03/18/2023   dysarthria   Chronic bronchitis (HCC)    secondary to cigarette smoking   FHx: allergies    Goiter    Granulomatous disease    Hernia    Kidney stone on left side 2013   NSTEMI (non-ST elevated myocardial infarction) (HCC) 03/23/2020   Oxygen  deficiency    Panic attacks    PVC's (premature ventricular contractions)    a. 03/2018 Zio: Occas PVCs (2.5%). Triggered events assoc w/ PVC/PAC.   Retinal detachment, left 03/04/2020   Retinal tear 2020   Stroke (HCC) 10/29/2016   mild left side weakness   Stroke (HCC) 08/01/2019   Tobacco abuse    Past Surgical History:  Procedure Laterality Date   ABDOMINAL HYSTERECTOMY     BREAST SURGERY     CATARACT EXTRACTION W/PHACO Right 03/01/2017   Procedure: CATARACT EXTRACTION PHACO AND INTRAOCULAR LENS PLACEMENT (IOC)  RIGHT DIABETIC;  Surgeon: Mittie Gaskin, MD;  Location: [facility];  Service: Ophthalmology;  Laterality: Right;   CATARACT EXTRACTION W/PHACO Left 03/22/2017   Procedure: CATARACT EXTRACTION PHACO AND INTRAOCULAR LENS PLACEMENT (IOC) LEFT DIABETIC;  Surgeon: Mittie Gaskin, MD;  Location: [facility];  Service: Ophthalmology;  Laterality: Left;  Diabetic - insulin  and oral meds   COLONOSCOPY WITH PROPOFOL  N/A 09/23/2014   Procedure: COLONOSCOPY WITH PROPOFOL ;  Surgeon: Gladis RAYMOND Mariner, MD;  Location: [facility] ENDOSCOPY;  Service: Endoscopy;  Laterality: N/A;   COLONOSCOPY WITH PROPOFOL  N/A 01/11/2018   Procedure: COLONOSCOPY WITH PROPOFOL ;  Surgeon: Mariner Gladis RAYMOND, MD;  Location: [facility] ENDOSCOPY;  Service: Endoscopy;  Laterality: N/A;   COLONOSCOPY WITH PROPOFOL  N/A 04/24/2018   Procedure: COLONOSCOPY WITH PROPOFOL ;  Surgeon: Mariner Gladis RAYMOND, MD;  Location: [facility] ENDOSCOPY;  Service: Endoscopy;  Laterality: N/A;   COLONOSCOPY WITH PROPOFOL  N/A 11/19/2019   Procedure: COLONOSCOPY WITH PROPOFOL ;  Surgeon: Jinny Carmine, MD;  Location: [facility] ENDOSCOPY;  Service: Endoscopy;  Laterality: N/A;   CORONARY ANGIOPLASTY WITH STENT PLACEMENT  2008   CORONARY STENT INTERVENTION N/A 03/24/2020   Procedure: CORONARY STENT INTERVENTION;  Surgeon: Mady Bruckner, MD;  Location: [facility] INVASIVE CV LAB;  Service: Cardiovascular;  Laterality: N/A;   ESOPHAGOGASTRODUODENOSCOPY (EGD) WITH PROPOFOL  N/A 12/30/2014   Procedure: ESOPHAGOGASTRODUODENOSCOPY (EGD) WITH PROPOFOL ;  Surgeon:  Gladis RAYMOND Mariner, MD;  Location: [facility] ENDOSCOPY;  Service: Endoscopy;  Laterality: N/A;   ESOPHAGOGASTRODUODENOSCOPY (EGD) WITH PROPOFOL  N/A 07/19/2016   Procedure: ESOPHAGOGASTRODUODENOSCOPY (EGD) WITH PROPOFOL ;  Surgeon: Mariner Gladis RAYMOND, MD;  Location: [facility] ENDOSCOPY;  Service: Endoscopy;  Laterality: N/A;   ESOPHAGOGASTRODUODENOSCOPY (EGD) WITH PROPOFOL  N/A 04/24/2018   Procedure:  ESOPHAGOGASTRODUODENOSCOPY (EGD) WITH PROPOFOL ;  Surgeon: Mariner Gladis RAYMOND, MD;  Location: [facility] ENDOSCOPY;  Service: Endoscopy;  Laterality: N/A;   EYE SURGERY  cataracts, detached retina   hysterectomy (other)     LEFT HEART CATH AND CORONARY ANGIOGRAPHY N/A 03/24/2020   Procedure: LEFT HEART CATH AND CORONARY ANGIOGRAPHY;  Surgeon: Mady Bruckner, MD;  Location: [facility] INVASIVE CV LAB;  Service: Cardiovascular;  Laterality: N/A;   Patient Active Problem List   Diagnosis Date Noted   Stroke (HCC) 08/05/2023   Depression with anxiety 08/05/2023   Overweight (BMI 25.0-29.9) 08/05/2023   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) 03/17/2023   Dizziness 02/23/2023   Restrictive airway disease 07/21/2020   Partial thickness rotator cuff tear 03/20/2020   Shoulder pain 03/05/2020   Ataxia 02/28/2020   Difficulty walking 02/28/2020   Physical deconditioning 02/28/2020   Chronic diastolic congestive heart failure (HCC) 11/20/2019   Polyp of ascending colon    Hypokalemia 08/08/2019   TIA (transient ischemic attack) 08/01/2019   Benign hypertensive kidney disease with chronic kidney disease 04/25/2019   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/24/2018   Chronic, continuous use of opioids 03/26/2018   History of adenomatous polyp of colon 01/22/2018   Diverticulosis of colon 01/22/2018   History of CVA (cerebrovascular accident) 11/08/2016   Weakness of left upper extremity 10/30/2016   AAA (abdominal aortic aneurysm) without rupture 08/12/2016   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   Stage 3b chronic kidney disease (HCC) 07/27/2015   Type 2 diabetes mellitus with diabetic chronic kidney disease (HCC) 10/28/2014   Solitary pulmonary nodule on lung CT 08/20/2014   Allergic rhinitis 07/28/2014   Acid reflux 07/28/2014   Chronic obstructive pulmonary disease (HCC) 07/28/2014   Granuloma annulare    Back pain 11/12/2013   Lumbar scoliosis 10/30/2013   Lumbar  radiculopathy 10/30/2013   Lumbar canal stenosis 10/30/2013   Proteinuria 10/22/2013   Smokes with greater than 30 pack year history 03/21/2011   Edema 08/18/2010   Hyperlipidemia 03/25/2009   Coronary artery disease 03/25/2009   Primary hypertension 03/25/2009   ONSET DATE: 08/05/2023  REFERRING DIAG: M70.101 (ICD-10-CM) - Weakness of left upper extremity   THERAPY DIAG:  Muscle weakness (generalized)  Rationale for Evaluation and Treatment: Rehabilitation  SUBJECTIVE:  SUBJECTIVE STATEMENT:   Pt.'s daughter reports that they want to see if her current rollator will be sufficient, or if they need to purchase a new one for her. Pt accompanied by: family member: daughter, Harrie  PERTINENT HISTORY: Daughter present and provided hx.  Daughter reports pt has had 4 strokes, beginning in 2018, 2019, January of 2025 and the most recent in June of 2025.  Pt had been participating in [facility] since the most recent CVA, and is now eager to transition to outpatient services where PT/OT/SLP have been ordered. Per MEDICAL RECORD NUMBERPMH: of CVA, COPD, Dysarthria, Ataxia, TIA, T2DM, Back pain, scoliosis   PRECAUTIONS: Fall, Loop recorder for Afib  WEIGHT BEARING RESTRICTIONS: No  PAIN:   02/29/24: No reports of pain. 02/27/24: 4/10 back pain Are you having pain? Yes: NPRS scale: 3/10, can get up to 7-8/10 Pain location: low back and L  hip Pain description: achy, sharp, dull Aggravating factors: prolonged standing  Relieving factors: sitting, pain meds, heat/ice   FALLS: Has patient fallen in last 6 months? Yes. Number of falls 1, tripping over cords  LIVING ENVIRONMENT: Lives with: lives with their spouse Lives in: 1 level home Stairs: Yes: External: 1 steps; none Has following equipment at home: walk in shower, comfort height toilet   PLOF: Prior to Jan of this year, pt was managing all home management tasks, was able to drive, and was caring for spouse   PATIENT GOALS: Strengthening the  left arm   OBJECTIVE:  Note: Objective measures were completed at Evaluation unless otherwise noted.  HAND DOMINANCE: Right  ADLs: Overall ADLs: Daughter is pt's primary caregiver and lives near by Transfers/ambulation related to ADLs: occasional help with using walker at home, depending on balance on a given day Eating: indep/able to cut food  Grooming: bimanual grooming without difficulty UB Dressing: extra time for clothing fasteners, but able to gather clothing with RW LB Dressing: extra time with clothing fasteners Toileting: occasional help to walk to the bathroom, but modified indep within the bathroom for toileting tasks Bathing: direct supv for bathing using walkin in shower, shower chair, and grab bar; gets hair washed weekly at salon Tub Shower transfers: supv for walk in shower Equipment: RW, SBQC, shower chair, grab bars  IADLs: Shopping: daughter currently managing Light housekeeping: daughter currently managing; can start laundry on a good day.  Meal Prep: starting to participate in light meal prep, including making eggs on stove top Community mobility: daughter assists with all transportation; pt using transport chair to reach therapy gym today Medication management: daughter sets up pills 2 weeks at a time  Financial management: pt is participating in bill paying with daughter's assistance; most are online Handwriting: 100% legible  POSTURE COMMENTS:  rounded shoulders, forward head, increased thoracic kyphosis  ACTIVITY TOLERANCE: Activity tolerance: to be further assessed within functional contexts  FUNCTIONAL OUTCOME MEASURES: TBD  UPPER EXTREMITY ROM:  BUEs WFL for daily tasks  UPPER EXTREMITY MMT:  Hx of R rotator cuff tear 4-5 years ago with residual weakness, non-surgical;    MMT Right eval Right 01/03/24 Left eval Left 01/03/24  Shoulder flexion 3+ 3+ 4 4  Shoulder abduction 3+ 3+ 4 4  Shoulder adduction      Shoulder extension       Shoulder internal rotation 3+ 3+ 4- 4  Shoulder external rotation 3+ 3+ 3+ 4-  Elbow flexion 5 5 5 5   Elbow extension 5 5 5 5   Wrist flexion 4+ 5 5 5   Wrist extension 4+ 5 5 5   Wrist ulnar deviation      Wrist radial deviation      Wrist pronation      Wrist supination      (Blank rows = not tested)   *Most recent CVA affecting L non-dominant side, with LUE presenting with increased strength as compared to dominant/unaffected arm.  Assume RUE weaker from hx of rotator cuff tear, or possibly previous CVAs.  Pt does present with mild LUE ataxia, and bilat hand weakness.  HAND FUNCTION: Grip strength: Right: 32 lbs; Left: 30 lbs, Lateral pinch: Right: 8 lbs, Left: 6 lbs, and 3 point pinch: Right: 7 lbs, Left: 6 lbs Grip strength: Right: 40 lbs; Left: 30 lbs, Lateral pinch: Right: 16 lbs, Left: 12; 3 point pinch: Right: 18 lbs, Left: 13 lbs  01/25/24: Right: 35 lbs; Left: 30 lbs, Lateral pinch: Right 7  lbs, Left: 5 lbs, and 3 point pinch: Right: 7 lbs, Left: 4 lbs   COORDINATION: Finger Nose Finger test: LUE mild ataxia  9 Hole Peg test: Right: 29 sec; Left: 47 sec 01/03/24: Right: 27 sec, Left: 37 sec  01/25/24: Right 27 sec, Left: 35 sec   SENSATION: Pt reports intermittent numbness in the R thumb, IF, and LF  EDEMA: No visible edema  MUSCLE TONE: RUE: Within functional limits and LUE: Within functional limits  COGNITION: Overall cognitive status: Impaired; see SLP eval for details  VISION: Pt reports L eye feels more blurry since first stroke in 2018; pt has reading glasses but doesn't usually wear them   VISION ASSESSMENT: To be further assessed in functional context Tracking/Visual pursuits: Able to track stimulus in all quads without difficulty Saccades: WFL Visual Fields: no apparent deficits Clock drawing completed: all numbers spaced evenly, but pt left out the 1 and verbalized not knowing how to draw ten minutes to eleven when this direction was given.  Patient has  difficulty with following activities due to following visual impairments: To be further assessed  PERCEPTION: To be further assessed  PRAXIS: Impaired: Motor planning  OBSERVATIONS: Pt pleasant, cooperative.  Daughter present and very supportive in pt's care.                                                                                                                       TREATMENT DATE:02/29/24:    Therapeutic Activity:  -Facilitated opportunities for using the rollator walker to navigate throughout the gym from one surface to another while safely navigating turns. -Pt. worked on dispensing optician tasks, and then using the rollator walker to transport the linens, and bulk items throughout the OT clinic.  -Assessed Pt.'s ability to safely maneuver the rollator around countertops and cabinets in order to access them. -Performed multiple reps of sit to stand from the rollator. Pt. Required cues to reach back with her arms in preparation for sitting in a chair, or transport chair. Pt. Did not require any cues for using her arms when anticipating sitting on the rollator seat. -Facilitated bilateral [facility] skills grasping washers from a shallow magnetic dish a one tabletop surface, and transferred them using the rollator to another tabletop surface.   PATIENT EDUCATION: Education details: EC/laundry strategies Person educated: Patient and Child(ren) Education method: Explanation, Demonstration, and Verbal cues Education comprehension: verbalized understanding, returned demonstration, verbal cues required, and needs further education  HOME EXERCISE PROGRAM: Yellow theraputty  GOALS: Goals reviewed with patient? Yes  SHORT TERM GOALS: Target date: 03/07/24  Pt will perform HEP with min vc or less for improving LUE coordination and bilat hand strength. Baseline: Eval: HEP not yet initiated; 01/03/24: Pt acknowledges minimal participation in HEP in the home, but does have HEP in place  with putty and coordination training activities; 01/25/24: see 01/03/24 Goal status: d/c/ not achieved  2.  Pt will be indep to verbalize and implement 2-3 fall prevention measures to reduce  fall risk with ADLs. Baseline: Eval: Educ not yet initiated; 01/03/24: 1 fall last week after reaching to floor level to pick up soiled dog pad while dizzy; pt able to verbalize 3 fall prevention strategies with min vc; 01/25/24: pt uses countertop for support while bending/reaching/ambulating in kitchen, but is inconsistent with walker in the home   Goal status: in progress  LONG TERM GOALS: Target date: 04/18/24  Pt will perform shower transfers with modified indep Baseline: Eval: Direct supv; 01/03/24: direct supv from spouse, which pt plans to continue Goal status: d/c  2.  Pt will improve LUE GMC to enable reaching with good accuracy into overhead kitchen cabinets. Baseline: Eval: Mild LUE ataxia; 01/03/24: Residual mild LUE ataxia and pt denies attempts at reaching into kitchen cupboards d/t family managing meals; 01/25/24: Demonstrated today with good accuracy  Goal status: achieved  3.  Pt will improve L hand [facility] skills as demonstrated by 10 sec or more improvement in 9 hole peg test for improved efficiency when manipulating  clothing fasteners. Baseline: Eval: L 9 hole peg test: L 47 sec, R 29 sec; 01/03/24: L 37 sec, R 27 sec, indep with clothing fasteners Goal status: achieved  4.  Pt will manage light loads of laundry with modified indep. Baseline: Eval: Daughter mostly manages, but pt can start laundry on a good day; 01/03/24: Pt can start laundry, and has been given recommendations for safe reaching strategies and item transport for increasing indep with this task, but has not yet attempted these strategies at home.  Pt continues to rely on family to manage laundry tasks; 01/25/24: Same as 01/03/24 as daughter reports she has not yet implemented recommendations for de-cluttering laundry room to  allow space for a chair  Goal status: in progress  5. Pt will perform hot or cold light meal prep with distant supv and RW or rollator for at least 1 meal per day with reported RPE of 6 or less on modified Borg scale. Baseline: 01/03/24: Family currently manages meal preparation; 01/25/24: Pt has started to use microwave and occasionally stove top to reheat food, but verbalizes taxing effort; RPE 7/10 (vigorous activity) on modified Borg scale.   Goal status: Revised on 01/25/24      6.  Pt will verbalize and implement 1-2 EC strategies with min vc or less to enable light sweeping and mopping of kitchen floor with reported RPE of 6 or less on modified Borg scale.      Baseline: 01/25/24: RPE 7-8.  Often relies on daughter.     Goal status: New  ASSESSMENT: CLINICAL IMPRESSION:  Pt. brought her rollator walker to review using it during OT this afternoon. For the initial assessment, Pt. was able to navigate the rollator safely around the OT gym. Pt. required multiple seated rest breaks. Pt. required cues for walker placement when attempting to access counters, and cabinets. Pt. required cues to use her hands to reach for the armrests when anticipating sitting. Pt. was independently able to consistently initiate the use of the rollator brakes appropriately. Pt./caregiver plan to practice using the rollator at home. Pt continues to benefit from skilled OT services to maximize indep, safety, and efficiency with IADL tasks.     PERFORMANCE DEFICITS: in functional skills including ADLs, IADLs, coordination, dexterity, sensation, ROM, strength, pain, Fine motor control, Gross motor control, mobility, balance, body mechanics, endurance, decreased knowledge of precautions, decreased knowledge of use of DME, vision, and UE functional use, cognitive skills including emotional, perception, problem solving,  safety awareness, temperament/personality, and understand, and psychosocial skills including coping  strategies, environmental adaptation, and routines and behaviors.   IMPAIRMENTS: are limiting patient from ADLs, IADLs, leisure, and social participation.   CO-MORBIDITIES: has co-morbidities such as afib, AAA, hx of multiple CVAs that affects occupational performance. Patient will benefit from skilled OT to address above impairments and improve overall function.  MODIFICATION OR ASSISTANCE TO COMPLETE EVALUATION: Min-Moderate modification of tasks or assist with assess necessary to complete an evaluation.  OT OCCUPATIONAL PROFILE AND HISTORY: Detailed assessment: Review of records and additional review of physical, cognitive, psychosocial history related to current functional performance.  CLINICAL DECISION MAKING: Moderate - several treatment options, min-mod task modification necessary  REHAB POTENTIAL: Good  EVALUATION COMPLEXITY: Moderate    PLAN:  OT FREQUENCY: 2x/week  OT DURATION: 12 weeks  PLANNED INTERVENTIONS: 97168 OT Re-evaluation, 97535 self care/ADL training, 02889 therapeutic exercise, 97530 therapeutic activity, 97112 neuromuscular re-education, 97140 manual therapy, 97116 gait training, 02989 moist heat, 97010 cryotherapy, 97129 Cognitive training (first 15 min), 02249 Physical Performance Testing, balance training, functional mobility training, visual/perceptual remediation/compensation, psychosocial skills training, energy conservation, coping strategies training, patient/family education, and DME and/or AE instructions  RECOMMENDED OTHER SERVICES: none at this time (OT/PT/SLP services have all been ordered)  CONSULTED AND AGREED WITH PLAN OF CARE: Patient and family member/caregiver  PLAN FOR NEXT SESSION: see above  Richardson Otter, MS, OTR/L   02/29/2024, 10:35 PM     "

## 2024-02-29 NOTE — Therapy (Addendum)
 " OUTPATIENT PHYSICAL THERAPY NEURO TREATMENT/Physical Therapy Progress Note   Dates of reporting period  11/23/23   to   02/29/24    Patient Name: Hannah Kirby MRN: [id number] DOB:02-20-1946, 79 y.o., female Today's Date: 02/29/2024   PCP: Gasper [name] BRAVO, MD REFERRING PROVIDER: Gasper [name] BRAVO, MD  END OF SESSION:    PT End of Session - 02/29/24 1315     Visit Number 20    Number of Visits 39    Date for Recertification  04/18/24    Progress Note Due on Visit 20    PT Start Time 1317    PT Stop Time 1359    PT Time Calculation (min) 42 min    Equipment Utilized During Treatment Gait belt    Activity Tolerance Patient tolerated treatment well    Behavior During Therapy WFL for tasks assessed/performed             Past Medical History:  Diagnosis Date   Acute hemorrhagic colitis 08/08/2019   Anemia    C. difficile colitis 08/09/2019   CAD (coronary artery disease)    a. 02/2006 PCI: BMS x 2 to RCA, cath o/w without significant coronary disease; b. nuclear stress test 07/2014: No ischemia/infarct; c. 11/2017 MV: no isch/infarct, EF 55-65%; d. 03/2020 NSTEMI/PCI: LM nl, LAD min irregs, RI 25, small, LCX nl, RCA 30p/m ISR, 99d (3.0x15 Resolute Onyx DES).   Cataract 02/04/2018   Cerebrovascular accident (CVA) (HCC) 03/18/2023   dysarthria   Chronic bronchitis (HCC)    secondary to cigarette smoking   FHx: allergies    Goiter    Granulomatous disease    Hernia    Kidney stone on left side 2013   NSTEMI (non-ST elevated myocardial infarction) (HCC) 03/23/2020   Oxygen  deficiency    Panic attacks    PVC's (premature ventricular contractions)    a. 03/2018 Zio: Occas PVCs (2.5%). Triggered events assoc w/ PVC/PAC.   Retinal detachment, left 03/04/2020   Retinal tear 2020   Stroke (HCC) 10/29/2016   mild left side weakness   Stroke (HCC) 08/01/2019   Tobacco abuse    Past Surgical History:  Procedure Laterality Date   ABDOMINAL HYSTERECTOMY     BREAST SURGERY      CATARACT EXTRACTION W/PHACO Right 03/01/2017   Procedure: CATARACT EXTRACTION PHACO AND INTRAOCULAR LENS PLACEMENT (IOC) RIGHT DIABETIC;  Surgeon: Mittie Gaskin, MD;  Location: [facility];  Service: Ophthalmology;  Laterality: Right;   CATARACT EXTRACTION W/PHACO Left 03/22/2017   Procedure: CATARACT EXTRACTION PHACO AND INTRAOCULAR LENS PLACEMENT (IOC) LEFT DIABETIC;  Surgeon: Mittie Gaskin, MD;  Location: [facility];  Service: Ophthalmology;  Laterality: Left;  Diabetic - insulin  and oral meds   COLONOSCOPY WITH PROPOFOL  N/A 09/23/2014   Procedure: COLONOSCOPY WITH PROPOFOL ;  Surgeon: Gladis RAYMOND Mariner, MD;  Location: [facility] ENDOSCOPY;  Service: Endoscopy;  Laterality: N/A;   COLONOSCOPY WITH PROPOFOL  N/A 01/11/2018   Procedure: COLONOSCOPY WITH PROPOFOL ;  Surgeon: Mariner Gladis RAYMOND, MD;  Location: [facility] ENDOSCOPY;  Service: Endoscopy;  Laterality: N/A;   COLONOSCOPY WITH PROPOFOL  N/A 04/24/2018   Procedure: COLONOSCOPY WITH PROPOFOL ;  Surgeon: Mariner Gladis RAYMOND, MD;  Location: [facility] ENDOSCOPY;  Service: Endoscopy;  Laterality: N/A;   COLONOSCOPY WITH PROPOFOL  N/A 11/19/2019   Procedure: COLONOSCOPY WITH PROPOFOL ;  Surgeon: Jinny Carmine, MD;  Location: [facility] ENDOSCOPY;  Service: Endoscopy;  Laterality: N/A;   CORONARY ANGIOPLASTY WITH STENT PLACEMENT  2008   CORONARY STENT INTERVENTION N/A 03/24/2020   Procedure: CORONARY  STENT INTERVENTION;  Surgeon: Mady Bruckner, MD;  Location: [facility] INVASIVE CV LAB;  Service: Cardiovascular;  Laterality: N/A;   ESOPHAGOGASTRODUODENOSCOPY (EGD) WITH PROPOFOL  N/A 12/30/2014   Procedure: ESOPHAGOGASTRODUODENOSCOPY (EGD) WITH PROPOFOL ;  Surgeon: Gladis RAYMOND Mariner, MD;  Location: [facility] ENDOSCOPY;  Service: Endoscopy;  Laterality: N/A;   ESOPHAGOGASTRODUODENOSCOPY (EGD) WITH PROPOFOL  N/A 07/19/2016   Procedure: ESOPHAGOGASTRODUODENOSCOPY (EGD) WITH PROPOFOL ;  Surgeon: Mariner Gladis RAYMOND, MD;  Location: [facility];  Service:  Endoscopy;  Laterality: N/A;   ESOPHAGOGASTRODUODENOSCOPY (EGD) WITH PROPOFOL  N/A 04/24/2018   Procedure: ESOPHAGOGASTRODUODENOSCOPY (EGD) WITH PROPOFOL ;  Surgeon: Mariner Gladis RAYMOND, MD;  Location: [facility] ENDOSCOPY;  Service: Endoscopy;  Laterality: N/A;   EYE SURGERY  cataracts, detached retina   hysterectomy (other)     LEFT HEART CATH AND CORONARY ANGIOGRAPHY N/A 03/24/2020   Procedure: LEFT HEART CATH AND CORONARY ANGIOGRAPHY;  Surgeon: Mady Bruckner, MD;  Location: [facility] INVASIVE CV LAB;  Service: Cardiovascular;  Laterality: N/A;   Patient Active Problem List   Diagnosis Date Noted   Stroke (HCC) 08/05/2023   Depression with anxiety 08/05/2023   Overweight (BMI 25.0-29.9) 08/05/2023   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) 03/17/2023   Dizziness 02/23/2023   Restrictive airway disease 07/21/2020   Partial thickness rotator cuff tear 03/20/2020   Shoulder pain 03/05/2020   Ataxia 02/28/2020   Difficulty walking 02/28/2020   Physical deconditioning 02/28/2020   Chronic diastolic congestive heart failure (HCC) 11/20/2019   Polyp of ascending colon    Hypokalemia 08/08/2019   TIA (transient ischemic attack) 08/01/2019   Benign hypertensive kidney disease with chronic kidney disease 04/25/2019   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/24/2018   Chronic, continuous use of opioids 03/26/2018   History of adenomatous polyp of colon 01/22/2018   Diverticulosis of colon 01/22/2018   History of CVA (cerebrovascular accident) 11/08/2016   Weakness of left upper extremity 10/30/2016   AAA (abdominal aortic aneurysm) without rupture 08/12/2016   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   Stage 3b chronic kidney disease (HCC) 07/27/2015   Type 2 diabetes mellitus with diabetic chronic kidney disease (HCC) 10/28/2014   Solitary pulmonary nodule on lung CT 08/20/2014   Allergic rhinitis 07/28/2014   Acid reflux 07/28/2014   Chronic obstructive pulmonary  disease (HCC) 07/28/2014   Granuloma annulare    Back pain 11/12/2013   Lumbar scoliosis 10/30/2013   Lumbar radiculopathy 10/30/2013   Lumbar canal stenosis 10/30/2013   Proteinuria 10/22/2013   Smokes with greater than 30 pack year history 03/21/2011   Edema 08/18/2010   Hyperlipidemia 03/25/2009   Coronary artery disease 03/25/2009   Primary hypertension 03/25/2009    ONSET DATE: 08/05/23  REFERRING DIAG:  Z86.73 (ICD-10-CM) - History of CVA (cerebrovascular accident)  R27.0 (ICD-10-CM) - Ataxia    THERAPY DIAG:   Muscle weakness (generalized)  Unsteadiness on feet  Abnormality of gait and mobility  Other abnormalities of gait and mobility  Difficulty in walking, not elsewhere classified  Rationale for Evaluation and Treatment: Rehabilitation  SUBJECTIVE:  SUBJECTIVE STATEMENT:  Pt doing well and is accompanied by daughter. No reported changes since last session.  Pt accompanied by: self and family member, daughter  PERTINENT HISTORY:  PMH: of CVA, COPD, Dysarthria, Ataxia, TIA, T2DM, Back pain, scoliosis   PAIN:  Are you having pain? Yes: NPRS scale: 3/10 Pain location: low back and L hip Pain description: achy Aggravating factors: just hurts all the time. Relieving factors: hydrocodone  at night  PRECAUTIONS: Other: Pt has a looper to see if pt has a-fib  RED FLAGS: Abdominal aneurysm: Yes: pt reports having an abdominal aneurism as found by Dr. Marea.   WEIGHT BEARING RESTRICTIONS: No  FALLS: Has patient fallen in last 6 months? Yes. Number of falls 1.  Pt reports losing balance, and got caught up in cords that were on the floor when she was trying to close the blinds.  LIVING ENVIRONMENT: Lives with: lives with their spouse Lives in: House/apartment Stairs: Yes:  External: 1 steps; none Has following equipment at home: Single point cane, Walker - 2 wheeled, Environmental Consultant - 4 wheeled, shower chair, and Grab bars  PLOF: Independent  PATIENT GOALS: Pt is wanting to gain independence.  Pt is wanting to improve endurance levels and improving speech with SLP.  OBJECTIVE:  Note: Objective measures were completed at Evaluation unless otherwise noted.  DIAGNOSTIC FINDINGS:   EXAM: MRI HEAD WITHOUT CONTRAST   MRA HEAD WITHOUT CONTRAST   MRA NECK WITHOUT AND WITH CONTRAST   IMPRESSION: 1. Small acute infarcts in the left frontal lobe, right parietal lobe, and left cerebellum. 2. Extensive chronic small vessel ischemic disease. 3. 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 4. Unchanged severe right A3 stenosis or segmental occlusion. 5. Moderate left P2 stenosis. 6. Limited assessment of the left vertebral artery origin, otherwise negative neck MRA.   COGNITION: Overall cognitive status: Within functional limits for tasks assessed   SENSATION: WFL, however pt notes having some numbness in the R hand.  COORDINATION: WFL  POSTURE: rounded shoulders, forward head, decreased lumbar lordosis, increased thoracic kyphosis, posterior pelvic tilt, and flexed trunk    LOWER EXTREMITY MMT:    MMT Right Eval Left Eval  Hip flexion 4 4  Hip extension    Hip abduction 4 4-  Hip adduction 4 4-  Hip internal rotation    Hip external rotation    Knee flexion 4 4-  Knee extension 4 4-  Ankle dorsiflexion 4 4-  Ankle plantarflexion    Ankle inversion    Ankle eversion    (Blank rows = not tested)  BED MOBILITY:  Not tested  FUNCTIONAL TESTS:  5 times sit to stand: 24.36 sec Timed up and go (TUG): 23.94 sec with walker 2 minute walk test: TBD 10 meter walk test: 20.33 sec with walker Berg Balance Scale: TBD  PATIENT SURVEYS:   Stroke Impact Scale 16 (Copyrighted instrument, [facility])  In the past 2  weeks, how difficult was it to...  Rating Scale 5 = Not difficult at all 4 = A little difficult 3 = Somewhat difficult 2 = Very difficult 1 = Could not do at all  a. Dress the top part of your body? 4  b. Bathe yourself? 2  c. Get to the toilet on time? 5  d. Control your bladder (not have an accident)? 5  e. Control your bowels (not have an accident)? 5  f. Stand without losing balance? 3  g. Go shopping? 2  h.  Do heavy household chores (e.g. vacuum, laundry or  yard work)? 1  i. Stay sitting without losing your balance? 3  j. Walk without losing your balance? 2  k. Move from a bed to a chair? 3  l. Walk fast? 1  m. Climb one flight of stairs? 1  n. Walk one block? 1  o. Get in and out of a car? 2  p. Carry heavy objects (e.g. bag of groceries) with your  affected hand? 1  Sum:  41/80  MDC (Minimal Detectable Change) is >/=8                                                                                                                                 TREATMENT DATE: 02/29/2024  Question date: 03/01/23  Dress the top part of your body? 75  Bathe yourself? 50  Get to the toilet on time? 75  Control your bladder (not have an accident)? 75  Control your bowels (not have an accident)? 75  Stand without losing balance? 50  Go shopping? 25  Do heavy household chores (e.g., vacuum, laundry, yard work)? 25  Stay sitting without losing balance? 50  Walk without losing balance? 25  Move from a bed to a chair? 25  Walk fast? 0  Climb one flight of stairs? 25  Walk one block? 25  Get in and out of a car? 50  Carry heavy objects (e.g., bag of groceries) with your affected hand? 0  total out of 1600 650  total / 1600 0.40625    Pt arrived to session in transport chair, daughter present.  Had pt ambulate with personal 4WW to treatment area.   BP=  124/73 MAP 89 SPO2: 98  Physical Performance Test or Measurement: a  physical performance test(s) or measurement (eg,   musculoskeletal, functional capacity), with written report,  each 15 mins    Physical therapy treatment session today consisted of completing assessment of goals and administration of testing as demonstrated and documented in flow sheet, treatment, and goals section of this note. Addition treatments may be found below.   Patient demonstrates increased fall risk as noted by score of  43 /56 on Berg Balance Scale.  (<36= high risk for falls, close to 100%; 37-45 significant >80%; 46-51 moderate >50%; 52-55 lower >25%)   [facility] PT Assessment - 02/29/24 0001       Berg Balance Test   Sit to Stand Able to stand  independently using hands    Standing Unsupported Able to stand safely 2 minutes    Sitting with Back Unsupported but Feet Supported on Floor or Stool Able to sit safely and securely 2 minutes    Stand to Sit Controls descent by using hands    Transfers Able to transfer safely, definite need of hands    Standing Unsupported with Eyes Closed Able to stand 10 seconds safely    Standing Unsupported with Feet Together Able  to place feet together independently and stand 1 minute safely    From Standing, Reach Forward with Outstretched Arm Can reach forward >12 cm safely (5)    From Standing Position, Pick up Object from Floor Able to pick up shoe, needs supervision    From Standing Position, Turn to Look Behind Over each Shoulder Looks behind from both sides and weight shifts well    Turn 360 Degrees Able to turn 360 degrees safely but slowly    Standing Unsupported, Alternately Place Feet on Step/Stool Able to stand independently and complete 8 steps >20 seconds    Standing Unsupported, One Foot in Front Able to take small step independently and hold 30 seconds    Standing on One Leg Tries to lift leg/unable to hold 3 seconds but remains standing independently    Total Score 43           PT instructed pt in TUG: 14.05 sec ( >13.5 sec indicates increased fall risk)  Five times Sit  to Stand Test (FTSS)  TIME: 27.85 sec  Cut off scores indicative of increased fall risk: >12 sec CVA, >16 sec PD, >13 sec vestibular (ANPTA Core Set of Outcome Measures for Adults with Neurologic Conditions, 2018)  10 Meter Walk Test: Patient instructed to walk 10 meters (32.8 ft) as quickly and as safely as possible at their normal speed Results: .56 m/s   Cut off scores:   Household Ambulator  < 0.4 m/s  Limited Community Ambulator  0.4 - 0.8 m/s  [employer]  > 0.8 m/s  Increased fall risk  < 1.86m/s  Crossing a Street  >1.46m/s  MCID 0.05 m/s (small), 0.13 m/s (moderate), 0.06 m/s (significant)  (ANPTA Core Set of Outcome Measures for Adults with Neurologic Conditions, 2018)     Community Ambulation Potential: A distance of >113 meters in the suggests a patient is likely to achieve community ambulation (equivalent to >358m in a 6-minute test). Minimal Detectable Change (MDC): For a change in performance to be considered clinically significant in adults, an improvement of at least 42.5 meters is typically required. Clinical Conditions: Average distances are significantly lower for those with medical conditions, such as mild Multiple Sclerosis (145m) or post-stroke (approx. 24m-113m)  221 ft with personal 4ww   PATIENT EDUCATION: Education details: Pt educated on role of PT and services provided during current POC, along with prognosis and information about the clinic.  Person educated: Patient and Child(ren) Education method: Explanation Education comprehension: verbalized understanding and returned demonstration  HOME EXERCISE PROGRAM:  Access Code: [email or web address] URL: [email or web address] Date: 10/17/2023 Prepared by: Sidra Simpers  Exercises - Standing March with Counter Support  - 1 x daily - 3-4 x weekly - 3 sets - 10 reps - Standing Knee Flexion with Unilateral Counter Support  - 1 x daily - 7 x weekly - 3 sets - 10 reps - Standing Hip  Extension with Unilateral Counter Support  - 1 x daily - 7 x weekly - 3 sets - 10 reps - Standing Hip Abduction with Unilateral Counter Support  - 1 x daily - 7 x weekly - 3 sets - 10 reps - Heel Toe Raises with Unilateral Counter Support  - 1 x daily - 7 x weekly - 3 sets - 10 reps  GOALS: Goals reviewed with patient? Yes  SHORT TERM GOALS: Target date: 11/15/2023  Pt will be independent with HEP in order to demonstrate increased ability to perform tasks related to occupation/hobbies. Baseline: pt  given HEP at eval Goal status: INITIAL  LONG TERM GOALS: Target date: 01/10/2024  1.  Patient (> 75 years old) will complete five times sit to stand test in < 15 seconds indicating an increased LE strength and improved balance. Baseline: 24.36 sec 1/8:27.85 sec Goal status: ONGOING  2.  Patient will improve SIS 16 score to 55   to demonstrate statistically significant improvement in mobility and quality of life as it relates to their CVA.  Baseline: 41 1/8:41 Goal status: INITIAL   3.  Patient will increase Berg Balance score by > 6 points to demonstrate decreased fall risk during functional activities. Baseline: 33/56 Goal status: INITIAL   4.  Patient will reduce timed up and go to <11 seconds to reduce fall risk and demonstrate improved transfer/gait ability. Baseline: 23.94 sec with walker 1/8: 24.05 sec rollator 26.88 sec with no AD Goal status: INITIAL  5.  Patient will increase 10 meter walk test to >1.96m/s as to improve gait speed for better community ambulation and to reduce fall risk. Baseline: 20.33 sec with walker 1/8:17.72 sec Goal status: INITIAL  6.  Patient will increase 2 minute walk test distance by 50 ft or greater for progression to community ambulator and demonstrate improved gait ability  Baseline: 275'; 83.82 m 1/8:221 ft no supplemental oxygen  Goal status: INITIAL     ASSESSMENT:  CLINICAL IMPRESSION:  Patient presents with good motivation for completion  of physical therapy activities.  Physical therapy treatment session today consisted of completing assessment of goals and administration of testing as demonstrated and documented in flow sheet, treatment, and goals section of this note. Pt showed mixed result with progress thus far showing improved balance and gait speed but less progress with functional outcome measures such as TUG, 5XSTS and . Instructed pt in importance of HEP for progression of these goals and pt and caregiver verbalized understanding of importance of completion. Patient's condition has the potential to improve in response to therapy. Maximum improvement is yet to be obtained. The anticipated improvement is attainable and reasonable in a generally predictable time. Pt will continue to benefit from skilled physical therapy intervention to address impairments, improve QOL, and attain therapy goals.     OBJECTIVE IMPAIRMENTS: Abnormal gait, decreased activity tolerance, decreased balance, decreased endurance, decreased knowledge of use of DME, decreased mobility, difficulty walking, decreased strength, impaired sensation, and pain.   ACTIVITY LIMITATIONS: carrying, lifting, bending, standing, squatting, stairs, transfers, bathing, toileting, dressing, hygiene/grooming, and locomotion level  PARTICIPATION LIMITATIONS: meal prep, cleaning, laundry, community activity, and yard work  PERSONAL FACTORS: Age and 3+ comorbidities:  PMH: of CVA, COPD, Dysarthria, Ataxia, TIA, T2DM, Back pain, scoliosis are also affecting patient's functional outcome.   REHAB POTENTIAL: Good  CLINICAL DECISION MAKING: Evolving/moderate complexity  EVALUATION COMPLEXITY: Moderate  PLAN:  PT FREQUENCY: 2x/week  PT DURATION: 12 weeks  PLANNED INTERVENTIONS: 97750- Physical Performance Testing, 97110-Therapeutic exercises, 97530- Therapeutic activity, 97112- Neuromuscular re-education, 97535- Self Care, [id number]- Manual therapy, [phone]- Gait training,  Balance training, and Vestibular training  PLAN FOR NEXT SESSION:   Monitor SPO2 and get portable oxygen  if necessary  Endurance, balance, strength ( L>R), updated HEP, gait training  -Limiting seated rest breaks as tolerated to challenge endurance  Note: Portions of this document were prepared using Dragon voice recognition software and although reviewed may contain unintentional dictation errors in syntax, grammar, or spelling.  02/29/2024, 2:24 PM  [name] PT ,DPT Physical Therapist- [place]  [facility]     "

## 2024-03-05 ENCOUNTER — Ambulatory Visit

## 2024-03-05 ENCOUNTER — Ambulatory Visit: Admitting: Physical Therapy

## 2024-03-05 DIAGNOSIS — M6281 Muscle weakness (generalized): Secondary | ICD-10-CM

## 2024-03-05 DIAGNOSIS — R2681 Unsteadiness on feet: Secondary | ICD-10-CM

## 2024-03-05 DIAGNOSIS — R2689 Other abnormalities of gait and mobility: Secondary | ICD-10-CM

## 2024-03-05 DIAGNOSIS — R269 Unspecified abnormalities of gait and mobility: Secondary | ICD-10-CM

## 2024-03-05 DIAGNOSIS — R4701 Aphasia: Secondary | ICD-10-CM

## 2024-03-05 DIAGNOSIS — R262 Difficulty in walking, not elsewhere classified: Secondary | ICD-10-CM

## 2024-03-05 DIAGNOSIS — R278 Other lack of coordination: Secondary | ICD-10-CM

## 2024-03-05 DIAGNOSIS — R482 Apraxia: Secondary | ICD-10-CM

## 2024-03-05 NOTE — Therapy (Signed)
 " OUTPATIENT SPEECH LANGUAGE PATHOLOGY APHASIA TREATMENT / PROGRESS NOTE / RE-CERTIFICATION    Patient Name: Hannah Kirby MRN: [id number] DOB:04-14-1945, 79 y.o., female Today's Date: 03/05/2024  PCP: [name] Perry, MD REFERRING PROVIDER: [name] Perry, MD  Speech Therapy Progress Note  Dates of Reporting Period: 11/23/23 to 02/27/24  Objective: Patient has been seen for 10 speech therapy sessions this reporting period targeting speech/language. Patient is making progress toward LTGs and met 1/2 STGs this reporting period. See skilled intervention, clinical impressions, and goals below for details.    End of Session - 03/05/24 1618     Visit Number 22    Number of Visits 37    Date for Recertification  04/09/24    SLP Start Time 1445    SLP Stop Time  1530    SLP Time Calculation (min) 45 min    Activity Tolerance Patient tolerated treatment well          Past Medical History:  Diagnosis Date   Acute hemorrhagic colitis 08/08/2019   Anemia    C. difficile colitis 08/09/2019   CAD (coronary artery disease)    a. 02/2006 PCI: BMS x 2 to RCA, cath o/w without significant coronary disease; b. nuclear stress test 07/2014: No ischemia/infarct; c. 11/2017 MV: no isch/infarct, EF 55-65%; d. 03/2020 NSTEMI/PCI: LM nl, LAD min irregs, RI 25, small, LCX nl, RCA 30p/m ISR, 99d (3.0x15 Resolute Onyx DES).   Cataract 02/04/2018   Cerebrovascular accident (CVA) (HCC) 03/18/2023   dysarthria   Chronic bronchitis (HCC)    secondary to cigarette smoking   FHx: allergies    Goiter    Granulomatous disease    Hernia    Kidney stone on left side 2013   NSTEMI (non-ST elevated myocardial infarction) (HCC) 03/23/2020   Oxygen  deficiency    Panic attacks    PVC's (premature ventricular contractions)    a. 03/2018 Zio: Occas PVCs (2.5%). Triggered events assoc w/ PVC/PAC.   Retinal detachment, left 03/04/2020   Retinal tear 2020   Stroke (HCC) 10/29/2016   mild left side weakness   Stroke  (HCC) 08/01/2019   Tobacco abuse    Past Surgical History:  Procedure Laterality Date   ABDOMINAL HYSTERECTOMY     BREAST SURGERY     CATARACT EXTRACTION W/PHACO Right 03/01/2017   Procedure: CATARACT EXTRACTION PHACO AND INTRAOCULAR LENS PLACEMENT (IOC) RIGHT DIABETIC;  Surgeon: Mittie Gaskin, MD;  Location: [facility];  Service: Ophthalmology;  Laterality: Right;   CATARACT EXTRACTION W/PHACO Left 03/22/2017   Procedure: CATARACT EXTRACTION PHACO AND INTRAOCULAR LENS PLACEMENT (IOC) LEFT DIABETIC;  Surgeon: Mittie Gaskin, MD;  Location: [facility];  Service: Ophthalmology;  Laterality: Left;  Diabetic - insulin  and oral meds   COLONOSCOPY WITH PROPOFOL  N/A 09/23/2014   Procedure: COLONOSCOPY WITH PROPOFOL ;  Surgeon: Gladis RAYMOND Mariner, MD;  Location: [facility] ENDOSCOPY;  Service: Endoscopy;  Laterality: N/A;   COLONOSCOPY WITH PROPOFOL  N/A 01/11/2018   Procedure: COLONOSCOPY WITH PROPOFOL ;  Surgeon: Mariner Gladis RAYMOND, MD;  Location: [facility] ENDOSCOPY;  Service: Endoscopy;  Laterality: N/A;   COLONOSCOPY WITH PROPOFOL  N/A 04/24/2018   Procedure: COLONOSCOPY WITH PROPOFOL ;  Surgeon: Mariner Gladis RAYMOND, MD;  Location: [facility] ENDOSCOPY;  Service: Endoscopy;  Laterality: N/A;   COLONOSCOPY WITH PROPOFOL  N/A 11/19/2019   Procedure: COLONOSCOPY WITH PROPOFOL ;  Surgeon: Jinny Carmine, MD;  Location: [facility] ENDOSCOPY;  Service: Endoscopy;  Laterality: N/A;   CORONARY ANGIOPLASTY WITH STENT PLACEMENT  2008   CORONARY STENT INTERVENTION N/A 03/24/2020  Procedure: CORONARY STENT INTERVENTION;  Surgeon: Mady Bruckner, MD;  Location: [facility] INVASIVE CV LAB;  Service: Cardiovascular;  Laterality: N/A;   ESOPHAGOGASTRODUODENOSCOPY (EGD) WITH PROPOFOL  N/A 12/30/2014   Procedure: ESOPHAGOGASTRODUODENOSCOPY (EGD) WITH PROPOFOL ;  Surgeon: Gladis RAYMOND Mariner, MD;  Location: [facility] ENDOSCOPY;  Service: Endoscopy;  Laterality: N/A;   ESOPHAGOGASTRODUODENOSCOPY (EGD) WITH PROPOFOL  N/A 07/19/2016    Procedure: ESOPHAGOGASTRODUODENOSCOPY (EGD) WITH PROPOFOL ;  Surgeon: Mariner Gladis RAYMOND, MD;  Location: [facility] ENDOSCOPY;  Service: Endoscopy;  Laterality: N/A;   ESOPHAGOGASTRODUODENOSCOPY (EGD) WITH PROPOFOL  N/A 04/24/2018   Procedure: ESOPHAGOGASTRODUODENOSCOPY (EGD) WITH PROPOFOL ;  Surgeon: Mariner Gladis RAYMOND, MD;  Location: [facility] ENDOSCOPY;  Service: Endoscopy;  Laterality: N/A;   EYE SURGERY  cataracts, detached retina   hysterectomy (other)     LEFT HEART CATH AND CORONARY ANGIOGRAPHY N/A 03/24/2020   Procedure: LEFT HEART CATH AND CORONARY ANGIOGRAPHY;  Surgeon: Mady Bruckner, MD;  Location: [facility] INVASIVE CV LAB;  Service: Cardiovascular;  Laterality: N/A;   Patient Active Problem List   Diagnosis Date Noted   Stroke (HCC) 08/05/2023   Depression with anxiety 08/05/2023   Overweight (BMI 25.0-29.9) 08/05/2023   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) 03/17/2023   Dizziness 02/23/2023   Restrictive airway disease 07/21/2020   Partial thickness rotator cuff tear 03/20/2020   Shoulder pain 03/05/2020   Ataxia 02/28/2020   Difficulty walking 02/28/2020   Physical deconditioning 02/28/2020   Chronic diastolic congestive heart failure (HCC) 11/20/2019   Polyp of ascending colon    Hypokalemia 08/08/2019   TIA (transient ischemic attack) 08/01/2019   Benign hypertensive kidney disease with chronic kidney disease 04/25/2019   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/24/2018   Chronic, continuous use of opioids 03/26/2018   History of adenomatous polyp of colon 01/22/2018   Diverticulosis of colon 01/22/2018   History of CVA (cerebrovascular accident) 11/08/2016   Weakness of left upper extremity 10/30/2016   AAA (abdominal aortic aneurysm) without rupture 08/12/2016   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   Stage 3b chronic kidney disease (HCC) 07/27/2015   Type 2 diabetes mellitus with diabetic chronic kidney disease (HCC) 10/28/2014   Solitary  pulmonary nodule on lung CT 08/20/2014   Allergic rhinitis 07/28/2014   Acid reflux 07/28/2014   Chronic obstructive pulmonary disease (HCC) 07/28/2014   Granuloma annulare    Back pain 11/12/2013   Lumbar scoliosis 10/30/2013   Lumbar radiculopathy 10/30/2013   Lumbar canal stenosis 10/30/2013   Proteinuria 10/22/2013   Smokes with greater than 30 pack year history 03/21/2011   Edema 08/18/2010   Hyperlipidemia 03/25/2009   Coronary artery disease 03/25/2009   Primary hypertension 03/25/2009    ONSET DATE: 03/17/23 (admitted with stroke), 05/23/23 (referral date)  REFERRING DIAG:  R41.841 (ICD-10-CM) - Cognitive communication deficit  I63.9 (ICD-10-CM) - CVA (cerebral vascular accident) (HCC)  R47.02 (ICD-10-CM) - Dysphasia    THERAPY DIAG:  Aphasia  Apraxia of speech  Rationale for Evaluation and Treatment Rehabilitation  SUBJECTIVE:   SUBJECTIVE STATEMENT: Pt alert, pleasant, and cooperative.  Pt accompanied by: self and family member  PERTINENT HISTORY:  Pt is a 79 y.o. female who presents for communication evaluation in setting on stroke. Pt in ED 1/24-1/25/25. Pt d/c'd home with HH ST. PMHx significant of   HTN, CKD stage IIIB, DMT2 on insulin , CAD, chronic smoking, chronic respiratory failure with COPD and on home oxygen , anxiety, HLD. MRI 03/17/23 1. Punctate foci of restricted diffusion in the left posterior  frontal white matter, most likely acute infarcts.  2. Numerous foci of hemosiderin deposition, which are seen in the  deep gray structures but also in the bilateral cerebral hemispheres,  with 1 new focus compared to 02/20/2023. While this could be the  sequela of chronic hypertensive microhemorrhages, this appearance is  concerning for cerebral amyloid angiopathy.   PAIN:  Are you having pain? No  FALLS: defer to PT  LIVING ENVIRONMENT: Lives with: lives with their spouse Lives in: House/apartment  PLOF:  Level of assistance: Independent with  ADLs Employment: Retired   PATIENT GOALS    for communication to improve   OBJECTIVE:  TODAY'S TREATMENT: Reviewed publishing rights manager including its rationale/purpose. Pt utilized simple scripts to ask questions to x5 unfamiliar listeners around hospital with min A. Pt endorsed fair performance on task, but noted scripts were helpful. Pt and SLP identified several instances where scripts would help pt. Pt to trial with MD tomorrow.      PATIENT EDUCATION: Education details: as above Person educated: Patient and Child Education method: Explanation Education comprehension: verbalized understanding and needs further education  HOME EXERCISE PROGRAM:    Practice scripts  SFA homework  To do list with x1 cognitive task per day    GOALS:  Goals reviewed with patient? Yes  SHORT TERM GOALS: Target date: 10 sessions  Pt will participate in further assessment of functional reading/writing. Baseline: Goal status: MET  2.  With Moderate A, patient will complete a semantic feature analysis with at least 2 relevant features for 8/10 target words to improve word-finding skills.  Baseline:  Goal status: PROGRESSING; continue goal on 01/11/24  3.  With Moderate A, patient will generate sentences with 3 or more words in response to a situation at 80% accuracy in order to increase ability to communicate basic wants and needs.  Baseline:  Goal status: MET at max cues; changed to mod cues on 02/27/24  4.   With Maximal A,  pt will follow 2-step commands for improved participation in ADLs/iADLs with max cueing. Baseline:  Goal status: MET  5.  Pt will repeat short sentences (<4 words) with good approximations 80% of the time with max cueing. Baseline:  Goal status: MET  New goal, 02/27/24 6. Pt will complete a cognitively based activity >3x week to promote cognitive-communication and life participation.   Baseline:  Goal status: INITIAL  7. Pt will utilize scripts with no more than  min cues to improve communication success. Baseline: Goal status: INITIAL      LONG TERM GOALS: Target date: 4 weeks; 02/08/24; goal continued until 03/30/24  Pt will report a subjective improvement in communication per PROM.  Baseline: CES 12/32 on 8/26 Goal status: INITIAL; continue goal  2.  With Min A, patient/family will demonstrate understanding of the following concepts: aphasia, spontaneous recovery, communication vs conversation, strengths/strategies to promote success, local resources in order to increase patient's participation in medical care.    Baseline:  Goal status: MET  3. Pt and/or daughter will demonstrate understanding of multiple ways to stimulate pt cognitively outside of ST.   Baseline:  Goal status: CONTINUE; progressing   ASSESSMENT:  CLINICAL IMPRESSION: Pt is a 79 y.o. female who presents for communication treatment in setting on stroke. Pt known to SLP services and was receiving OP ST prior to most recent stroke 08/05/23. Pt d/c'd home with [facility] ST who is working on securing SGD for pt. PMHx significant of  HTN, CKD stage IIIB, DMT2 on insulin , CAD, chronic smoking, chronic respiratory failure with COPD and  on home oxygen , anxiety, HLD. Re-assessment completed 01/10/34 via formal assessment Western Aphasia Battery Revised (WAB-R). 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 Noted improvement in pt's auditory comprehension and repetition; however, difficulty with spontaneous speech and naming/wordfinding persist. Course of therapy, thus far this episode, has be limited by pt's illness with patient missing several scheduled sessions and pt's limited support/cognitive stimulation at home. Recommend continued course of skilled ST on a trial basis for the next 6 weeks with pt in agreement to complete home assignments and attend therapy sessions regularly (>50% of the  time). See details of tx session above. Recommend course of ST targeting functional communication, further assessment of functional reading/writing, and pt/caregiver training to help promote QoL and overall life participation.   OBJECTIVE IMPAIRMENTS include expressive language, receptive language, and aphasia. These impairments are limiting patient from ADLs/IADLs and effectively communicating at home and in community. Factors affecting potential to achieve goals and functional outcome are severity of impairments. Patient will benefit from skilled SLP services to address above impairments and improve overall function.  REHAB POTENTIAL: Good  PLAN: SLP FREQUENCY: 1-2x/week  SLP DURATION: 6 weeks  PLANNED INTERVENTIONS: Language facilitation, Cueing hierachy, Internal/external aids, Functional tasks, Multimodal communication approach, SLP instruction and feedback, Compensatory strategies, and Patient/family education    Delon Bangs, M.S., CCC-SLP Speech-Language Pathologist [facility] [phone] [place])  [place] [facility] at [facility] [address] [place], [place], [address] Phone: [phone]   Fax:  [phone] "

## 2024-03-05 NOTE — Therapy (Signed)
 " OUTPATIENT PHYSICAL THERAPY NEURO TREATMENT   Patient Name: Hannah Kirby MRN: [id number] DOB:1945-08-05, 79 y.o., female Today's Date: 03/05/2024   PCP: Gasper [name] BRAVO, MD REFERRING PROVIDER: Gasper [name] BRAVO, MD  END OF SESSION:    PT End of Session - 03/05/24 1420     Visit Number 21    Number of Visits 39    Date for Recertification  04/18/24    Progress Note Due on Visit 20    PT Start Time 1409    PT Stop Time 1442    PT Time Calculation (min) 33 min    Equipment Utilized During Treatment Gait belt    Activity Tolerance Patient tolerated treatment well    Behavior During Therapy WFL for tasks assessed/performed             Past Medical History:  Diagnosis Date   Acute hemorrhagic colitis 08/08/2019   Anemia    C. difficile colitis 08/09/2019   CAD (coronary artery disease)    a. 02/2006 PCI: BMS x 2 to RCA, cath o/w without significant coronary disease; b. nuclear stress test 07/2014: No ischemia/infarct; c. 11/2017 MV: no isch/infarct, EF 55-65%; d. 03/2020 NSTEMI/PCI: LM nl, LAD min irregs, RI 25, small, LCX nl, RCA 30p/m ISR, 99d (3.0x15 Resolute Onyx DES).   Cataract 02/04/2018   Cerebrovascular accident (CVA) (HCC) 03/18/2023   dysarthria   Chronic bronchitis (HCC)    secondary to cigarette smoking   FHx: allergies    Goiter    Granulomatous disease    Hernia    Kidney stone on left side 2013   NSTEMI (non-ST elevated myocardial infarction) (HCC) 03/23/2020   Oxygen  deficiency    Panic attacks    PVC's (premature ventricular contractions)    a. 03/2018 Zio: Occas PVCs (2.5%). Triggered events assoc w/ PVC/PAC.   Retinal detachment, left 03/04/2020   Retinal tear 2020   Stroke (HCC) 10/29/2016   mild left side weakness   Stroke (HCC) 08/01/2019   Tobacco abuse    Past Surgical History:  Procedure Laterality Date   ABDOMINAL HYSTERECTOMY     BREAST SURGERY     CATARACT EXTRACTION W/PHACO Right 03/01/2017   Procedure: CATARACT EXTRACTION  PHACO AND INTRAOCULAR LENS PLACEMENT (IOC) RIGHT DIABETIC;  Surgeon: Mittie Gaskin, MD;  Location: [facility];  Service: Ophthalmology;  Laterality: Right;   CATARACT EXTRACTION W/PHACO Left 03/22/2017   Procedure: CATARACT EXTRACTION PHACO AND INTRAOCULAR LENS PLACEMENT (IOC) LEFT DIABETIC;  Surgeon: Mittie Gaskin, MD;  Location: [facility];  Service: Ophthalmology;  Laterality: Left;  Diabetic - insulin  and oral meds   COLONOSCOPY WITH PROPOFOL  N/A 09/23/2014   Procedure: COLONOSCOPY WITH PROPOFOL ;  Surgeon: Gladis RAYMOND Mariner, MD;  Location: [facility] ENDOSCOPY;  Service: Endoscopy;  Laterality: N/A;   COLONOSCOPY WITH PROPOFOL  N/A 01/11/2018   Procedure: COLONOSCOPY WITH PROPOFOL ;  Surgeon: Mariner Gladis RAYMOND, MD;  Location: [facility] ENDOSCOPY;  Service: Endoscopy;  Laterality: N/A;   COLONOSCOPY WITH PROPOFOL  N/A 04/24/2018   Procedure: COLONOSCOPY WITH PROPOFOL ;  Surgeon: Mariner Gladis RAYMOND, MD;  Location: [facility] ENDOSCOPY;  Service: Endoscopy;  Laterality: N/A;   COLONOSCOPY WITH PROPOFOL  N/A 11/19/2019   Procedure: COLONOSCOPY WITH PROPOFOL ;  Surgeon: Jinny Carmine, MD;  Location: [facility] ENDOSCOPY;  Service: Endoscopy;  Laterality: N/A;   CORONARY ANGIOPLASTY WITH STENT PLACEMENT  2008   CORONARY STENT INTERVENTION N/A 03/24/2020   Procedure: CORONARY STENT INTERVENTION;  Surgeon: Mady Bruckner, MD;  Location: [facility] INVASIVE CV LAB;  Service: Cardiovascular;  Laterality:  N/A;   ESOPHAGOGASTRODUODENOSCOPY (EGD) WITH PROPOFOL  N/A 12/30/2014   Procedure: ESOPHAGOGASTRODUODENOSCOPY (EGD) WITH PROPOFOL ;  Surgeon: Gladis RAYMOND Mariner, MD;  Location: [facility] ENDOSCOPY;  Service: Endoscopy;  Laterality: N/A;   ESOPHAGOGASTRODUODENOSCOPY (EGD) WITH PROPOFOL  N/A 07/19/2016   Procedure: ESOPHAGOGASTRODUODENOSCOPY (EGD) WITH PROPOFOL ;  Surgeon: Mariner Gladis RAYMOND, MD;  Location: [facility] ENDOSCOPY;  Service: Endoscopy;  Laterality: N/A;   ESOPHAGOGASTRODUODENOSCOPY (EGD) WITH PROPOFOL  N/A  04/24/2018   Procedure: ESOPHAGOGASTRODUODENOSCOPY (EGD) WITH PROPOFOL ;  Surgeon: Mariner Gladis RAYMOND, MD;  Location: [facility] ENDOSCOPY;  Service: Endoscopy;  Laterality: N/A;   EYE SURGERY  cataracts, detached retina   hysterectomy (other)     LEFT HEART CATH AND CORONARY ANGIOGRAPHY N/A 03/24/2020   Procedure: LEFT HEART CATH AND CORONARY ANGIOGRAPHY;  Surgeon: Mady Bruckner, MD;  Location: [facility] INVASIVE CV LAB;  Service: Cardiovascular;  Laterality: N/A;   Patient Active Problem List   Diagnosis Date Noted   Stroke (HCC) 08/05/2023   Depression with anxiety 08/05/2023   Overweight (BMI 25.0-29.9) 08/05/2023   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) 03/17/2023   Dizziness 02/23/2023   Restrictive airway disease 07/21/2020   Partial thickness rotator cuff tear 03/20/2020   Shoulder pain 03/05/2020   Ataxia 02/28/2020   Difficulty walking 02/28/2020   Physical deconditioning 02/28/2020   Chronic diastolic congestive heart failure (HCC) 11/20/2019   Polyp of ascending colon    Hypokalemia 08/08/2019   TIA (transient ischemic attack) 08/01/2019   Benign hypertensive kidney disease with chronic kidney disease 04/25/2019   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/24/2018   Chronic, continuous use of opioids 03/26/2018   History of adenomatous polyp of colon 01/22/2018   Diverticulosis of colon 01/22/2018   History of CVA (cerebrovascular accident) 11/08/2016   Weakness of left upper extremity 10/30/2016   AAA (abdominal aortic aneurysm) without rupture 08/12/2016   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   Stage 3b chronic kidney disease (HCC) 07/27/2015   Type 2 diabetes mellitus with diabetic chronic kidney disease (HCC) 10/28/2014   Solitary pulmonary nodule on lung CT 08/20/2014   Allergic rhinitis 07/28/2014   Acid reflux 07/28/2014   Chronic obstructive pulmonary disease (HCC) 07/28/2014   Granuloma annulare    Back pain 11/12/2013   Lumbar  scoliosis 10/30/2013   Lumbar radiculopathy 10/30/2013   Lumbar canal stenosis 10/30/2013   Proteinuria 10/22/2013   Smokes with greater than 30 pack year history 03/21/2011   Edema 08/18/2010   Hyperlipidemia 03/25/2009   Coronary artery disease 03/25/2009   Primary hypertension 03/25/2009    ONSET DATE: 08/05/23  REFERRING DIAG:  Z86.73 (ICD-10-CM) - History of CVA (cerebrovascular accident)  R27.0 (ICD-10-CM) - Ataxia    THERAPY DIAG:   Muscle weakness (generalized)  Unsteadiness on feet  Abnormality of gait and mobility  Other abnormalities of gait and mobility  Difficulty in walking, not elsewhere classified  Rationale for Evaluation and Treatment: Rehabilitation  SUBJECTIVE:  SUBJECTIVE STATEMENT:  Pt doing well and is accompanied by daughter. No reported changes since last session.  Pt accompanied by: self and family member, daughter  PERTINENT HISTORY:  PMH: of CVA, COPD, Dysarthria, Ataxia, TIA, T2DM, Back pain, scoliosis   PAIN:  Are you having pain? Yes: NPRS scale: 3/10 Pain location: low back and L hip Pain description: achy Aggravating factors: just hurts all the time. Relieving factors: hydrocodone  at night  PRECAUTIONS: Other: Pt has a looper to see if pt has a-fib  RED FLAGS: Abdominal aneurysm: Yes: pt reports having an abdominal aneurism as found by Dr. Marea.   WEIGHT BEARING RESTRICTIONS: No  FALLS: Has patient fallen in last 6 months? Yes. Number of falls 1.  Pt reports losing balance, and got caught up in cords that were on the floor when she was trying to close the blinds.  LIVING ENVIRONMENT: Lives with: lives with their spouse Lives in: House/apartment Stairs: Yes: External: 1 steps; none Has following equipment at home: Single point cane, Walker - 2  wheeled, Environmental Consultant - 4 wheeled, shower chair, and Grab bars  PLOF: Independent  PATIENT GOALS: Pt is wanting to gain independence.  Pt is wanting to improve endurance levels and improving speech with SLP.  OBJECTIVE:  Note: Objective measures were completed at Evaluation unless otherwise noted.  DIAGNOSTIC FINDINGS:   EXAM: MRI HEAD WITHOUT CONTRAST   MRA HEAD WITHOUT CONTRAST   MRA NECK WITHOUT AND WITH CONTRAST   IMPRESSION: 1. Small acute infarcts in the left frontal lobe, right parietal lobe, and left cerebellum. 2. Extensive chronic small vessel ischemic disease. 3. 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 4. Unchanged severe right A3 stenosis or segmental occlusion. 5. Moderate left P2 stenosis. 6. Limited assessment of the left vertebral artery origin, otherwise negative neck MRA.   COGNITION: Overall cognitive status: Within functional limits for tasks assessed   SENSATION: WFL, however pt notes having some numbness in the R hand.  COORDINATION: WFL  POSTURE: rounded shoulders, forward head, decreased lumbar lordosis, increased thoracic kyphosis, posterior pelvic tilt, and flexed trunk    LOWER EXTREMITY MMT:    MMT Right Eval Left Eval  Hip flexion 4 4  Hip extension    Hip abduction 4 4-  Hip adduction 4 4-  Hip internal rotation    Hip external rotation    Knee flexion 4 4-  Knee extension 4 4-  Ankle dorsiflexion 4 4-  Ankle plantarflexion    Ankle inversion    Ankle eversion    (Blank rows = not tested)  BED MOBILITY:  Not tested  FUNCTIONAL TESTS:  5 times sit to stand: 24.36 sec Timed up and go (TUG): 23.94 sec with walker 2 minute walk test: TBD 10 meter walk test: 20.33 sec with walker Berg Balance Scale: TBD  PATIENT SURVEYS:   Stroke Impact Scale 16 (Copyrighted instrument, [facility])  In the past 2 weeks, how difficult was it to...  Rating Scale 5 = Not difficult at all 4 = A little  difficult 3 = Somewhat difficult 2 = Very difficult 1 = Could not do at all  a. Dress the top part of your body? 4  b. Bathe yourself? 2  c. Get to the toilet on time? 5  d. Control your bladder (not have an accident)? 5  e. Control your bowels (not have an accident)? 5  f. Stand without losing balance? 3  g. Go shopping? 2  h.  Do heavy household chores (e.g. vacuum, laundry or  yard work)? 1  i. Stay sitting without losing your balance? 3  j. Walk without losing your balance? 2  k. Move from a bed to a chair? 3  l. Walk fast? 1  m. Climb one flight of stairs? 1  n. Walk one block? 1  o. Get in and out of a car? 2  p. Carry heavy objects (e.g. bag of groceries) with your  affected hand? 1  Sum:  41/80  MDC (Minimal Detectable Change) is >/=8                                                                                                                                 TREATMENT DATE: 03/05/2024  Pt session was cut a little short today due to pt arriving late to scheduled appointment time  TA- To improve functional movements patterns for everyday tasks   STS with airex in seat - instruction in sequencing an dform 3 x 5 reps  STS than ambulate across room and back with CGA and no AD x 100 ft - cues for STS no hands - airex in set still   STS then 10 x ea LE step ups - airex in seat - UE for step ups  - another round same thing    Shopping cart style gait after STS x 100 ft to SLP office to end session   PATIENT EDUCATION: Education details: Pt educated on role of PT and services provided during current POC, along with prognosis and information about the clinic.  Person educated: Patient and Child(ren) Education method: Explanation Education comprehension: verbalized understanding and returned demonstration  HOME EXERCISE PROGRAM:  Access Code: [email or web address] URL: [email or web address] Date: 10/17/2023 Prepared by: Sidra Simpers  Exercises - Standing  March with Counter Support  - 1 x daily - 3-4 x weekly - 3 sets - 10 reps - Standing Knee Flexion with Unilateral Counter Support  - 1 x daily - 7 x weekly - 3 sets - 10 reps - Standing Hip Extension with Unilateral Counter Support  - 1 x daily - 7 x weekly - 3 sets - 10 reps - Standing Hip Abduction with Unilateral Counter Support  - 1 x daily - 7 x weekly - 3 sets - 10 reps - Heel Toe Raises with Unilateral Counter Support  - 1 x daily - 7 x weekly - 3 sets - 10 reps  GOALS: Goals reviewed with patient? Yes  SHORT TERM GOALS: Target date: 11/15/2023  Pt will be independent with HEP in order to demonstrate increased ability to perform tasks related to occupation/hobbies. Baseline: pt given HEP at eval Goal status: INITIAL  LONG TERM GOALS: Target date: 01/10/2024  1.  Patient (> 41 years old) will complete five times sit to stand test in < 15 seconds indicating an increased LE strength and improved balance. Baseline: 24.36 sec 1/8:27.85 sec Goal status: ONGOING  2.  Patient will improve SIS 16 score to 55   to demonstrate statistically significant improvement in mobility and quality of life as it relates to their CVA.  Baseline: 41 1/8:41 Goal status: INITIAL   3.  Patient will increase Berg Balance score by > 6 points to demonstrate decreased fall risk during functional activities. Baseline: 33/56 Goal status: INITIAL   4.  Patient will reduce timed up and go to <11 seconds to reduce fall risk and demonstrate improved transfer/gait ability. Baseline: 23.94 sec with walker 1/8: 24.05 sec rollator 26.88 sec with no AD Goal status: INITIAL  5.  Patient will increase 10 meter walk test to >1.11m/s as to improve gait speed for better community ambulation and to reduce fall risk. Baseline: 20.33 sec with walker 1/8:17.72 sec Goal status: INITIAL  6.  Patient will increase 2 minute walk test distance by 50 ft or greater for progression to community ambulator and demonstrate improved  gait ability  Baseline: 275'; 83.82 m 1/8:221 ft no supplemental oxygen  Goal status: INITIAL     ASSESSMENT:  CLINICAL IMPRESSION:  Patient presents with good motivation for completion of physical therapy activities.Pt challenged with high volume and STS and functional mobility training this date. Pt showed improved form and ease with STS with increased practice and repetitions and not allowing use of UE from chair. Pt still defaults to using UE on chair unless prompted. Pt will continue to benefit from skilled physical therapy intervention to address impairments, improve QOL, and attain therapy goals.     OBJECTIVE IMPAIRMENTS: Abnormal gait, decreased activity tolerance, decreased balance, decreased endurance, decreased knowledge of use of DME, decreased mobility, difficulty walking, decreased strength, impaired sensation, and pain.   ACTIVITY LIMITATIONS: carrying, lifting, bending, standing, squatting, stairs, transfers, bathing, toileting, dressing, hygiene/grooming, and locomotion level  PARTICIPATION LIMITATIONS: meal prep, cleaning, laundry, community activity, and yard work  PERSONAL FACTORS: Age and 3+ comorbidities:  PMH: of CVA, COPD, Dysarthria, Ataxia, TIA, T2DM, Back pain, scoliosis are also affecting patient's functional outcome.   REHAB POTENTIAL: Good  CLINICAL DECISION MAKING: Evolving/moderate complexity  EVALUATION COMPLEXITY: Moderate  PLAN:  PT FREQUENCY: 2x/week  PT DURATION: 12 weeks  PLANNED INTERVENTIONS: 97750- Physical Performance Testing, 97110-Therapeutic exercises, 97530- Therapeutic activity, 97112- Neuromuscular re-education, 97535- Self Care, [id number]- Manual therapy, [phone]- Gait training, Balance training, and Vestibular training  PLAN FOR NEXT SESSION:   Monitor SPO2 and get portable oxygen  if necessary  Endurance, balance, strength ( L>R), updated HEP, gait training  -Limiting seated rest breaks as tolerated to challenge endurance  Note:  Portions of this document were prepared using Dragon voice recognition software and although reviewed may contain unintentional dictation errors in syntax, grammar, or spelling.  03/05/2024, 2:20 PM  [name] PT ,DPT Physical Therapist- [place]  [facility]     "

## 2024-03-06 ENCOUNTER — Ambulatory Visit: Admitting: Family Medicine

## 2024-03-06 NOTE — Therapy (Signed)
 " OUTPATIENT OCCUPATIONAL THERAPY NEURO TREATMENT NOTE  Patient Name: Hannah Kirby MRN: [id number] DOB:12/04/1945, 79 y.o., female Today's Date: 03/06/2024  PCP: Dr. [name] Perry REFERRING PROVIDER: Dr. [name] Perry  END OF SESSION:  OT End of Session - 03/06/24 1941     Visit Number 19    Number of Visits 24    Date for Recertification  04/18/24    OT Start Time 1530    OT Stop Time 1610    OT Time Calculation (min) 40 min    Equipment Utilized During Treatment transport chair    Activity Tolerance Patient tolerated treatment well    Behavior During Therapy WFL for tasks assessed/performed         Past Medical History:  Diagnosis Date   Acute hemorrhagic colitis 08/08/2019   Anemia    C. difficile colitis 08/09/2019   CAD (coronary artery disease)    a. 02/2006 PCI: BMS x 2 to RCA, cath o/w without significant coronary disease; b. nuclear stress test 07/2014: No ischemia/infarct; c. 11/2017 MV: no isch/infarct, EF 55-65%; d. 03/2020 NSTEMI/PCI: LM nl, LAD min irregs, RI 25, small, LCX nl, RCA 30p/m ISR, 99d (3.0x15 Resolute Onyx DES).   Cataract 02/04/2018   Cerebrovascular accident (CVA) (HCC) 03/18/2023   dysarthria   Chronic bronchitis (HCC)    secondary to cigarette smoking   FHx: allergies    Goiter    Granulomatous disease    Hernia    Kidney stone on left side 2013   NSTEMI (non-ST elevated myocardial infarction) (HCC) 03/23/2020   Oxygen  deficiency    Panic attacks    PVC's (premature ventricular contractions)    a. 03/2018 Zio: Occas PVCs (2.5%). Triggered events assoc w/ PVC/PAC.   Retinal detachment, left 03/04/2020   Retinal tear 2020   Stroke (HCC) 10/29/2016   mild left side weakness   Stroke (HCC) 08/01/2019   Tobacco abuse    Past Surgical History:  Procedure Laterality Date   ABDOMINAL HYSTERECTOMY     BREAST SURGERY     CATARACT EXTRACTION W/PHACO Right 03/01/2017   Procedure: CATARACT EXTRACTION PHACO AND INTRAOCULAR LENS PLACEMENT (IOC)  RIGHT DIABETIC;  Surgeon: Mittie Gaskin, MD;  Location: [facility];  Service: Ophthalmology;  Laterality: Right;   CATARACT EXTRACTION W/PHACO Left 03/22/2017   Procedure: CATARACT EXTRACTION PHACO AND INTRAOCULAR LENS PLACEMENT (IOC) LEFT DIABETIC;  Surgeon: Mittie Gaskin, MD;  Location: [facility];  Service: Ophthalmology;  Laterality: Left;  Diabetic - insulin  and oral meds   COLONOSCOPY WITH PROPOFOL  N/A 09/23/2014   Procedure: COLONOSCOPY WITH PROPOFOL ;  Surgeon: Gladis RAYMOND Mariner, MD;  Location: [facility] ENDOSCOPY;  Service: Endoscopy;  Laterality: N/A;   COLONOSCOPY WITH PROPOFOL  N/A 01/11/2018   Procedure: COLONOSCOPY WITH PROPOFOL ;  Surgeon: Mariner Gladis RAYMOND, MD;  Location: [facility] ENDOSCOPY;  Service: Endoscopy;  Laterality: N/A;   COLONOSCOPY WITH PROPOFOL  N/A 04/24/2018   Procedure: COLONOSCOPY WITH PROPOFOL ;  Surgeon: Mariner Gladis RAYMOND, MD;  Location: [facility] ENDOSCOPY;  Service: Endoscopy;  Laterality: N/A;   COLONOSCOPY WITH PROPOFOL  N/A 11/19/2019   Procedure: COLONOSCOPY WITH PROPOFOL ;  Surgeon: Jinny Carmine, MD;  Location: [facility] ENDOSCOPY;  Service: Endoscopy;  Laterality: N/A;   CORONARY ANGIOPLASTY WITH STENT PLACEMENT  2008   CORONARY STENT INTERVENTION N/A 03/24/2020   Procedure: CORONARY STENT INTERVENTION;  Surgeon: Mady Bruckner, MD;  Location: [facility] INVASIVE CV LAB;  Service: Cardiovascular;  Laterality: N/A;   ESOPHAGOGASTRODUODENOSCOPY (EGD) WITH PROPOFOL  N/A 12/30/2014   Procedure: ESOPHAGOGASTRODUODENOSCOPY (EGD) WITH PROPOFOL ;  Surgeon:  Gladis RAYMOND Mariner, MD;  Location: [facility] ENDOSCOPY;  Service: Endoscopy;  Laterality: N/A;   ESOPHAGOGASTRODUODENOSCOPY (EGD) WITH PROPOFOL  N/A 07/19/2016   Procedure: ESOPHAGOGASTRODUODENOSCOPY (EGD) WITH PROPOFOL ;  Surgeon: Mariner Gladis RAYMOND, MD;  Location: [facility] ENDOSCOPY;  Service: Endoscopy;  Laterality: N/A;   ESOPHAGOGASTRODUODENOSCOPY (EGD) WITH PROPOFOL  N/A 04/24/2018   Procedure:  ESOPHAGOGASTRODUODENOSCOPY (EGD) WITH PROPOFOL ;  Surgeon: Mariner Gladis RAYMOND, MD;  Location: [facility] ENDOSCOPY;  Service: Endoscopy;  Laterality: N/A;   EYE SURGERY  cataracts, detached retina   hysterectomy (other)     LEFT HEART CATH AND CORONARY ANGIOGRAPHY N/A 03/24/2020   Procedure: LEFT HEART CATH AND CORONARY ANGIOGRAPHY;  Surgeon: Mady Bruckner, MD;  Location: [facility] INVASIVE CV LAB;  Service: Cardiovascular;  Laterality: N/A;   Patient Active Problem List   Diagnosis Date Noted   Stroke (HCC) 08/05/2023   Depression with anxiety 08/05/2023   Overweight (BMI 25.0-29.9) 08/05/2023   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) 03/17/2023   Dizziness 02/23/2023   Restrictive airway disease 07/21/2020   Partial thickness rotator cuff tear 03/20/2020   Shoulder pain 03/05/2020   Ataxia 02/28/2020   Difficulty walking 02/28/2020   Physical deconditioning 02/28/2020   Chronic diastolic congestive heart failure (HCC) 11/20/2019   Polyp of ascending colon    Hypokalemia 08/08/2019   TIA (transient ischemic attack) 08/01/2019   Benign hypertensive kidney disease with chronic kidney disease 04/25/2019   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/24/2018   Chronic, continuous use of opioids 03/26/2018   History of adenomatous polyp of colon 01/22/2018   Diverticulosis of colon 01/22/2018   History of CVA (cerebrovascular accident) 11/08/2016   Weakness of left upper extremity 10/30/2016   AAA (abdominal aortic aneurysm) without rupture 08/12/2016   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   Stage 3b chronic kidney disease (HCC) 07/27/2015   Type 2 diabetes mellitus with diabetic chronic kidney disease (HCC) 10/28/2014   Solitary pulmonary nodule on lung CT 08/20/2014   Allergic rhinitis 07/28/2014   Acid reflux 07/28/2014   Chronic obstructive pulmonary disease (HCC) 07/28/2014   Granuloma annulare    Back pain 11/12/2013   Lumbar scoliosis 10/30/2013   Lumbar  radiculopathy 10/30/2013   Lumbar canal stenosis 10/30/2013   Proteinuria 10/22/2013   Smokes with greater than 30 pack year history 03/21/2011   Edema 08/18/2010   Hyperlipidemia 03/25/2009   Coronary artery disease 03/25/2009   Primary hypertension 03/25/2009   ONSET DATE: 08/05/2023  REFERRING DIAG: R29.898 (ICD-10-CM) - Weakness of left upper extremity   THERAPY DIAG:  Muscle weakness (generalized)  Other lack of coordination  Rationale for Evaluation and Treatment: Rehabilitation  SUBJECTIVE:  SUBJECTIVE STATEMENT: Daughter reports pt participated in laundry tasks this weekend, folding some towels and helping daughter to organize space for easier access to machines.   Pt accompanied by: family member: daughter, Harrie  PERTINENT HISTORY: Daughter present and provided hx.  Daughter reports pt has had 4 strokes, beginning in 2018, 2019, January of 2025 and the most recent in June of 2025.  Pt had been participating in [facility] since the most recent CVA, and is now eager to transition to outpatient services where PT/OT/SLP have been ordered. Per MEDICAL RECORD NUMBERPMH: of CVA, COPD, Dysarthria, Ataxia, TIA, T2DM, Back pain, scoliosis   PRECAUTIONS: Fall, Loop recorder for Afib  WEIGHT BEARING RESTRICTIONS: No  PAIN:  03/05/24: No pain 02/29/24: No reports of pain. 02/27/24: 4/10 back pain Are you having pain? Yes: NPRS scale: 3/10, can get up to 7-8/10 Pain location:  low back and L hip Pain description: achy, sharp, dull Aggravating factors: prolonged standing  Relieving factors: sitting, pain meds, heat/ice   FALLS: Has patient fallen in last 6 months? Yes. Number of falls 1, tripping over cords  LIVING ENVIRONMENT: Lives with: lives with their spouse Lives in: 1 level home Stairs: Yes: External: 1 steps; none Has following equipment at home: walk in shower, comfort height toilet   PLOF: Prior to Jan of this year, pt was managing all home management tasks, was able to drive, and  was caring for spouse   PATIENT GOALS: Strengthening the left arm   OBJECTIVE:  Note: Objective measures were completed at Evaluation unless otherwise noted.  HAND DOMINANCE: Right  ADLs: Overall ADLs: Daughter is pt's primary caregiver and lives near by Transfers/ambulation related to ADLs: occasional help with using walker at home, depending on balance on a given day Eating: indep/able to cut food  Grooming: bimanual grooming without difficulty UB Dressing: extra time for clothing fasteners, but able to gather clothing with RW LB Dressing: extra time with clothing fasteners Toileting: occasional help to walk to the bathroom, but modified indep within the bathroom for toileting tasks Bathing: direct supv for bathing using walkin in shower, shower chair, and grab bar; gets hair washed weekly at salon Tub Shower transfers: supv for walk in shower Equipment: RW, SBQC, shower chair, grab bars  IADLs: Shopping: daughter currently managing Light housekeeping: daughter currently managing; can start laundry on a good day.  Meal Prep: starting to participate in light meal prep, including making eggs on stove top Community mobility: daughter assists with all transportation; pt using transport chair to reach therapy gym today Medication management: daughter sets up pills 2 weeks at a time  Financial management: pt is participating in bill paying with daughter's assistance; most are online Handwriting: 100% legible  POSTURE COMMENTS:  rounded shoulders, forward head, increased thoracic kyphosis  ACTIVITY TOLERANCE: Activity tolerance: to be further assessed within functional contexts  FUNCTIONAL OUTCOME MEASURES: TBD  UPPER EXTREMITY ROM:  BUEs WFL for daily tasks  UPPER EXTREMITY MMT:  Hx of R rotator cuff tear 4-5 years ago with residual weakness, non-surgical;    MMT Right eval Right 01/03/24 Left eval Left 01/03/24  Shoulder flexion 3+ 3+ 4 4  Shoulder abduction 3+ 3+ 4  4  Shoulder adduction      Shoulder extension      Shoulder internal rotation 3+ 3+ 4- 4  Shoulder external rotation 3+ 3+ 3+ 4-  Elbow flexion 5 5 5 5   Elbow extension 5 5 5 5   Wrist flexion 4+ 5 5 5   Wrist extension 4+ 5 5 5   Wrist ulnar deviation      Wrist radial deviation      Wrist pronation      Wrist supination      (Blank rows = not tested)   *Most recent CVA affecting L non-dominant side, with LUE presenting with increased strength as compared to dominant/unaffected arm.  Assume RUE weaker from hx of rotator cuff tear, or possibly previous CVAs.  Pt does present with mild LUE ataxia, and bilat hand weakness.  HAND FUNCTION: Grip strength: Right: 32 lbs; Left: 30 lbs, Lateral pinch: Right: 8 lbs, Left: 6 lbs, and 3 point pinch: Right: 7 lbs, Left: 6 lbs Grip strength: Right: 40 lbs; Left: 30 lbs, Lateral pinch: Right: 16 lbs, Left: 12; 3 point pinch: Right: 18 lbs, Left: 13 lbs  01/25/24: Right: 35 lbs; Left: 30 lbs,  Lateral pinch: Right 7 lbs, Left: 5 lbs, and 3 point pinch: Right: 7 lbs, Left: 4 lbs   COORDINATION: Finger Nose Finger test: LUE mild ataxia  9 Hole Peg test: Right: 29 sec; Left: 47 sec 01/03/24: Right: 27 sec, Left: 37 sec  01/25/24: Right 27 sec, Left: 35 sec   SENSATION: Pt reports intermittent numbness in the R thumb, IF, and LF  EDEMA: No visible edema  MUSCLE TONE: RUE: Within functional limits and LUE: Within functional limits  COGNITION: Overall cognitive status: Impaired; see SLP eval for details  VISION: Pt reports L eye feels more blurry since first stroke in 2018; pt has reading glasses but doesn't usually wear them   VISION ASSESSMENT: To be further assessed in functional context Tracking/Visual pursuits: Able to track stimulus in all quads without difficulty Saccades: WFL Visual Fields: no apparent deficits Clock drawing completed: all numbers spaced evenly, but pt left out the 1 and verbalized not knowing how to draw ten minutes to  eleven when this direction was given.  Patient has difficulty with following activities due to following visual impairments: To be further assessed  PERCEPTION: To be further assessed  PRAXIS: Impaired: Motor planning  OBSERVATIONS: Pt pleasant, cooperative.  Daughter present and very supportive in pt's care.                                                                                                                       TREATMENT DATE:03/05/24:  Self Care: -Reviewed laundry strategies and changes made in the home to increase access to washer/dryer. -EC strategies reinforced, including using pacing strategies, using rollator seat for rest breaks during ADL/IADL tasks, and using reacher to reduce extended reach for ADL supplies.  Therapeutic Activity: -Use of rollator for functional mobility within therapy clinic, working to increase activity tolerance for ADL/IADLs in the home; Simulation of IADL tasks, including transporting ADL supplies, watering plants.  -Practiced rollator safety and fall prevention with STS trials from rollator seat with rollator pushed against wall vs locking rollator free standing in room without stability of wall; vc for safe transfer technique to reduce movement of walker during transfer.    PATIENT EDUCATION: Education details: EC/laundry strategies Person educated: Patient Education method: Programmer, Multimedia, Demonstration, and Verbal cues Education comprehension: verbalized understanding, returned demonstration, verbal cues required, and needs further education  HOME EXERCISE PROGRAM: Yellow theraputty  GOALS: Goals reviewed with patient? Yes  SHORT TERM GOALS: Target date: 03/07/24  Pt will perform HEP with min vc or less for improving LUE coordination and bilat hand strength. Baseline: Eval: HEP not yet initiated; 01/03/24: Pt acknowledges minimal participation in HEP in the home, but does have HEP in place with putty and coordination training activities;  01/25/24: see 01/03/24 Goal status: d/c/ not achieved  2.  Pt will be indep to verbalize and implement 2-3 fall prevention measures to reduce fall risk with ADLs. Baseline: Eval: Educ not yet initiated; 01/03/24: 1 fall last week after reaching to floor level to  pick up soiled dog pad while dizzy; pt able to verbalize 3 fall prevention strategies with min vc; 01/25/24: pt uses countertop for support while bending/reaching/ambulating in kitchen, but is inconsistent with walker in the home   Goal status: in progress  LONG TERM GOALS: Target date: 04/18/24  Pt will perform shower transfers with modified indep Baseline: Eval: Direct supv; 01/03/24: direct supv from spouse, which pt plans to continue Goal status: d/c  2.  Pt will improve LUE GMC to enable reaching with good accuracy into overhead kitchen cabinets. Baseline: Eval: Mild LUE ataxia; 01/03/24: Residual mild LUE ataxia and pt denies attempts at reaching into kitchen cupboards d/t family managing meals; 01/25/24: Demonstrated today with good accuracy  Goal status: achieved  3.  Pt will improve L hand [facility] skills as demonstrated by 10 sec or more improvement in 9 hole peg test for improved efficiency when manipulating  clothing fasteners. Baseline: Eval: L 9 hole peg test: L 47 sec, R 29 sec; 01/03/24: L 37 sec, R 27 sec, indep with clothing fasteners Goal status: achieved  4.  Pt will manage light loads of laundry with modified indep. Baseline: Eval: Daughter mostly manages, but pt can start laundry on a good day; 01/03/24: Pt can start laundry, and has been given recommendations for safe reaching strategies and item transport for increasing indep with this task, but has not yet attempted these strategies at home.  Pt continues to rely on family to manage laundry tasks; 01/25/24: Same as 01/03/24 as daughter reports she has not yet implemented recommendations for de-cluttering laundry room to allow space for a chair  Goal status: in  progress  5. Pt will perform hot or cold light meal prep with distant supv and RW or rollator for at least 1 meal per day with reported RPE of 6 or less on modified Borg scale. Baseline: 01/03/24: Family currently manages meal preparation; 01/25/24: Pt has started to use microwave and occasionally stove top to reheat food, but verbalizes taxing effort; RPE 7/10 (vigorous activity) on modified Borg scale.   Goal status: Revised on 01/25/24      6.  Pt will verbalize and implement 1-2 EC strategies with min vc or less to enable light sweeping and mopping of kitchen floor with reported RPE of 6 or less on modified Borg scale.      Baseline: 01/25/24: RPE 7-8.  Often relies on daughter.     Goal status: New  ASSESSMENT: CLINICAL IMPRESSION: Pt rated simulated IADLs and functional mobility within clinic as RPE of 5 on modified Borg scale (moderately difficult).  02 sats checked intermittently throughout session, with pt consistently 90-93% on room air with intermittent rest breaks during and between activities.  Pt and daughter have started to reorganize laundry space for increasing pt's access to machines.  Pt reports they have not yet put a chair in the laundry room (recommended for [facility]), but they have placed a reacher on the door as recommended by OT, and have cleared some space, allowing pt's participation in part of laundry this week, including folding towels and stacking them within laundry room.  Pt shows consistent initiation to lock/unlock rollator brakes during transfers or before reaching for an item outside BOS.  Pt does require vc for positioning rollator against a wall or sturdy surface when available before using rollator seat for a rest break in order to reduce fall risk.  Clinic rollator used today.  Encouraged daughter bring pt's rollator for each session to continue to  ensure consistency with safety using her personal rollator.  Daughter verbalized understanding.  Planning to reduce OT  frequency to 1x per week, with daughter being in agreement, as pt now has a variety of ADL/IADL strategies to practice at home.  Will reassess next visit for additional OT needs/assess progress towards goals vs readiness for OT d/c.      PERFORMANCE DEFICITS: in functional skills including ADLs, IADLs, coordination, dexterity, sensation, ROM, strength, pain, Fine motor control, Gross motor control, mobility, balance, body mechanics, endurance, decreased knowledge of precautions, decreased knowledge of use of DME, vision, and UE functional use, cognitive skills including emotional, perception, problem solving, safety awareness, temperament/personality, and understand, and psychosocial skills including coping strategies, environmental adaptation, and routines and behaviors.   IMPAIRMENTS: are limiting patient from ADLs, IADLs, leisure, and social participation.   CO-MORBIDITIES: has co-morbidities such as afib, AAA, hx of multiple CVAs that affects occupational performance. Patient will benefit from skilled OT to address above impairments and improve overall function.  MODIFICATION OR ASSISTANCE TO COMPLETE EVALUATION: Min-Moderate modification of tasks or assist with assess necessary to complete an evaluation.  OT OCCUPATIONAL PROFILE AND HISTORY: Detailed assessment: Review of records and additional review of physical, cognitive, psychosocial history related to current functional performance.  CLINICAL DECISION MAKING: Moderate - several treatment options, min-mod task modification necessary  REHAB POTENTIAL: Good  EVALUATION COMPLEXITY: Moderate    PLAN:  OT FREQUENCY: 1x/week  OT DURATION: 12 weeks  PLANNED INTERVENTIONS: 97168 OT Re-evaluation, 97535 self care/ADL training, 02889 therapeutic exercise, 97530 therapeutic activity, 97112 neuromuscular re-education, 97140 manual therapy, 97116 gait training, 02989 moist heat, 97010 cryotherapy, 97129 Cognitive training (first 15 min), 02249  Physical Performance Testing, balance training, functional mobility training, visual/perceptual remediation/compensation, psychosocial skills training, energy conservation, coping strategies training, patient/family education, and DME and/or AE instructions  RECOMMENDED OTHER SERVICES: none at this time (OT/PT/SLP services have all been ordered)  CONSULTED AND AGREED WITH PLAN OF CARE: Patient and family member/caregiver  PLAN FOR NEXT SESSION: see above  Inocente Blazing, MS, OTR/L   03/06/2024, 7:45 PM     "

## 2024-03-07 ENCOUNTER — Ambulatory Visit

## 2024-03-07 ENCOUNTER — Ambulatory Visit: Admitting: Occupational Therapy

## 2024-03-07 ENCOUNTER — Telehealth: Payer: Self-pay | Admitting: Physical Therapy

## 2024-03-07 ENCOUNTER — Ambulatory Visit: Admitting: Physical Therapy

## 2024-03-07 NOTE — Telephone Encounter (Signed)
Pt contacted via telephone and author left voice mail informing of missed appointment and informed pt of future PT appointment date and time.   Christopher Byrd PT, DPT   

## 2024-03-08 ENCOUNTER — Other Ambulatory Visit: Payer: Self-pay | Admitting: Neurology

## 2024-03-08 DIAGNOSIS — R413 Other amnesia: Secondary | ICD-10-CM

## 2024-03-08 DIAGNOSIS — Z8673 Personal history of transient ischemic attack (TIA), and cerebral infarction without residual deficits: Secondary | ICD-10-CM

## 2024-03-12 ENCOUNTER — Ambulatory Visit: Admitting: Physical Therapy

## 2024-03-12 ENCOUNTER — Ambulatory Visit

## 2024-03-12 ENCOUNTER — Ambulatory Visit: Admitting: Occupational Therapy

## 2024-03-12 DIAGNOSIS — R2689 Other abnormalities of gait and mobility: Secondary | ICD-10-CM

## 2024-03-12 DIAGNOSIS — R269 Unspecified abnormalities of gait and mobility: Secondary | ICD-10-CM

## 2024-03-12 DIAGNOSIS — M6281 Muscle weakness (generalized): Secondary | ICD-10-CM

## 2024-03-12 DIAGNOSIS — R2681 Unsteadiness on feet: Secondary | ICD-10-CM

## 2024-03-12 DIAGNOSIS — R262 Difficulty in walking, not elsewhere classified: Secondary | ICD-10-CM

## 2024-03-12 NOTE — Therapy (Addendum)
 " OUTPATIENT PHYSICAL THERAPY NEURO TREATMENT   Patient Name: Hannah Kirby MRN: [id number] DOB:Aug 07, 1945, 79 y.o., female Today's Date: 03/12/2024   PCP: Gasper [name] BRAVO, MD REFERRING PROVIDER: Gasper [name] BRAVO, MD  END OF SESSION:    PT End of Session - 03/12/24 1322     Visit Number 22    Number of Visits 39    Date for Recertification  04/18/24    Progress Note Due on Visit 30    PT Start Time 1319    PT Stop Time 1359    PT Time Calculation (min) 40 min    Equipment Utilized During Treatment Gait belt    Activity Tolerance Patient tolerated treatment well    Behavior During Therapy WFL for tasks assessed/performed             Past Medical History:  Diagnosis Date   Acute hemorrhagic colitis 08/08/2019   Anemia    C. difficile colitis 08/09/2019   CAD (coronary artery disease)    a. 02/2006 PCI: BMS x 2 to RCA, cath o/w without significant coronary disease; b. nuclear stress test 07/2014: No ischemia/infarct; c. 11/2017 MV: no isch/infarct, EF 55-65%; d. 03/2020 NSTEMI/PCI: LM nl, LAD min irregs, RI 25, small, LCX nl, RCA 30p/m ISR, 99d (3.0x15 Resolute Onyx DES).   Cataract 02/04/2018   Cerebrovascular accident (CVA) (HCC) 03/18/2023   dysarthria   Chronic bronchitis (HCC)    secondary to cigarette smoking   FHx: allergies    Goiter    Granulomatous disease    Hernia    Kidney stone on left side 2013   NSTEMI (non-ST elevated myocardial infarction) (HCC) 03/23/2020   Oxygen  deficiency    Panic attacks    PVC's (premature ventricular contractions)    a. 03/2018 Zio: Occas PVCs (2.5%). Triggered events assoc w/ PVC/PAC.   Retinal detachment, left 03/04/2020   Retinal tear 2020   Stroke (HCC) 10/29/2016   mild left side weakness   Stroke (HCC) 08/01/2019   Tobacco abuse    Past Surgical History:  Procedure Laterality Date   ABDOMINAL HYSTERECTOMY     BREAST SURGERY     CATARACT EXTRACTION W/PHACO Right 03/01/2017   Procedure: CATARACT EXTRACTION  PHACO AND INTRAOCULAR LENS PLACEMENT (IOC) RIGHT DIABETIC;  Surgeon: Mittie Gaskin, MD;  Location: [facility];  Service: Ophthalmology;  Laterality: Right;   CATARACT EXTRACTION W/PHACO Left 03/22/2017   Procedure: CATARACT EXTRACTION PHACO AND INTRAOCULAR LENS PLACEMENT (IOC) LEFT DIABETIC;  Surgeon: Mittie Gaskin, MD;  Location: [facility];  Service: Ophthalmology;  Laterality: Left;  Diabetic - insulin  and oral meds   COLONOSCOPY WITH PROPOFOL  N/A 09/23/2014   Procedure: COLONOSCOPY WITH PROPOFOL ;  Surgeon: Gladis RAYMOND Mariner, MD;  Location: [facility];  Service: Endoscopy;  Laterality: N/A;   COLONOSCOPY WITH PROPOFOL  N/A 01/11/2018   Procedure: COLONOSCOPY WITH PROPOFOL ;  Surgeon: Mariner Gladis RAYMOND, MD;  Location: [facility] ENDOSCOPY;  Service: Endoscopy;  Laterality: N/A;   COLONOSCOPY WITH PROPOFOL  N/A 04/24/2018   Procedure: COLONOSCOPY WITH PROPOFOL ;  Surgeon: Mariner Gladis RAYMOND, MD;  Location: [facility] ENDOSCOPY;  Service: Endoscopy;  Laterality: N/A;   COLONOSCOPY WITH PROPOFOL  N/A 11/19/2019   Procedure: COLONOSCOPY WITH PROPOFOL ;  Surgeon: Jinny Carmine, MD;  Location: [facility] ENDOSCOPY;  Service: Endoscopy;  Laterality: N/A;   CORONARY ANGIOPLASTY WITH STENT PLACEMENT  2008   CORONARY STENT INTERVENTION N/A 03/24/2020   Procedure: CORONARY STENT INTERVENTION;  Surgeon: Mady Bruckner, MD;  Location: [facility] INVASIVE CV LAB;  Service: Cardiovascular;  Laterality:  N/A;   ESOPHAGOGASTRODUODENOSCOPY (EGD) WITH PROPOFOL  N/A 12/30/2014   Procedure: ESOPHAGOGASTRODUODENOSCOPY (EGD) WITH PROPOFOL ;  Surgeon: Gladis RAYMOND Mariner, MD;  Location: [facility] ENDOSCOPY;  Service: Endoscopy;  Laterality: N/A;   ESOPHAGOGASTRODUODENOSCOPY (EGD) WITH PROPOFOL  N/A 07/19/2016   Procedure: ESOPHAGOGASTRODUODENOSCOPY (EGD) WITH PROPOFOL ;  Surgeon: Mariner Gladis RAYMOND, MD;  Location: [facility] ENDOSCOPY;  Service: Endoscopy;  Laterality: N/A;   ESOPHAGOGASTRODUODENOSCOPY (EGD) WITH PROPOFOL  N/A  04/24/2018   Procedure: ESOPHAGOGASTRODUODENOSCOPY (EGD) WITH PROPOFOL ;  Surgeon: Mariner Gladis RAYMOND, MD;  Location: [facility] ENDOSCOPY;  Service: Endoscopy;  Laterality: N/A;   EYE SURGERY  cataracts, detached retina   hysterectomy (other)     LEFT HEART CATH AND CORONARY ANGIOGRAPHY N/A 03/24/2020   Procedure: LEFT HEART CATH AND CORONARY ANGIOGRAPHY;  Surgeon: Mady Bruckner, MD;  Location: [facility] INVASIVE CV LAB;  Service: Cardiovascular;  Laterality: N/A;   Patient Active Problem List   Diagnosis Date Noted   Stroke (HCC) 08/05/2023   Depression with anxiety 08/05/2023   Overweight (BMI 25.0-29.9) 08/05/2023   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) 03/17/2023   Dizziness 02/23/2023   Restrictive airway disease 07/21/2020   Partial thickness rotator cuff tear 03/20/2020   Shoulder pain 03/05/2020   Ataxia 02/28/2020   Difficulty walking 02/28/2020   Physical deconditioning 02/28/2020   Chronic diastolic congestive heart failure (HCC) 11/20/2019   Polyp of ascending colon    Hypokalemia 08/08/2019   TIA (transient ischemic attack) 08/01/2019   Benign hypertensive kidney disease with chronic kidney disease 04/25/2019   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/24/2018   Chronic, continuous use of opioids 03/26/2018   History of adenomatous polyp of colon 01/22/2018   Diverticulosis of colon 01/22/2018   History of CVA (cerebrovascular accident) 11/08/2016   Weakness of left upper extremity 10/30/2016   AAA (abdominal aortic aneurysm) without rupture 08/12/2016   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   Stage 3b chronic kidney disease (HCC) 07/27/2015   Type 2 diabetes mellitus with diabetic chronic kidney disease (HCC) 10/28/2014   Solitary pulmonary nodule on lung CT 08/20/2014   Allergic rhinitis 07/28/2014   Acid reflux 07/28/2014   Chronic obstructive pulmonary disease (HCC) 07/28/2014   Granuloma annulare    Back pain 11/12/2013   Lumbar  scoliosis 10/30/2013   Lumbar radiculopathy 10/30/2013   Lumbar canal stenosis 10/30/2013   Proteinuria 10/22/2013   Smokes with greater than 30 pack year history 03/21/2011   Edema 08/18/2010   Hyperlipidemia 03/25/2009   Coronary artery disease 03/25/2009   Primary hypertension 03/25/2009    ONSET DATE: 08/05/23  REFERRING DIAG:  Z86.73 (ICD-10-CM) - History of CVA (cerebrovascular accident)  R27.0 (ICD-10-CM) - Ataxia    THERAPY DIAG:   Muscle weakness (generalized)  Abnormality of gait and mobility  Unsteadiness on feet  Other abnormalities of gait and mobility  Difficulty in walking, not elsewhere classified  Rationale for Evaluation and Treatment: Rehabilitation  SUBJECTIVE:  SUBJECTIVE STATEMENT:  Pt doing well and is accompanied by daughter. No reported changes since last session. Some hip pain noted.   Pt accompanied by: self and family member, daughter  PERTINENT HISTORY:  PMH: of CVA, COPD, Dysarthria, Ataxia, TIA, T2DM, Back pain, scoliosis   PAIN:  Are you having pain? Yes: NPRS scale: 3/10 Pain location: low back and L hip Pain description: achy Aggravating factors: just hurts all the time. Relieving factors: hydrocodone  at night  PRECAUTIONS: Other: Pt has a looper to see if pt has a-fib  RED FLAGS: Abdominal aneurysm: Yes: pt reports having an abdominal aneurism as found by Dr. Marea.   WEIGHT BEARING RESTRICTIONS: No  FALLS: Has patient fallen in last 6 months? Yes. Number of falls 1.  Pt reports losing balance, and got caught up in cords that were on the floor when she was trying to close the blinds.  LIVING ENVIRONMENT: Lives with: lives with their spouse Lives in: House/apartment Stairs: Yes: External: 1 steps; none Has following equipment at home: Single  point cane, Walker - 2 wheeled, Environmental Consultant - 4 wheeled, shower chair, and Grab bars  PLOF: Independent  PATIENT GOALS: Pt is wanting to gain independence.  Pt is wanting to improve endurance levels and improving speech with SLP.  OBJECTIVE:  Note: Objective measures were completed at Evaluation unless otherwise noted.  DIAGNOSTIC FINDINGS:   EXAM: MRI HEAD WITHOUT CONTRAST   MRA HEAD WITHOUT CONTRAST   MRA NECK WITHOUT AND WITH CONTRAST   IMPRESSION: 1. Small acute infarcts in the left frontal lobe, right parietal lobe, and left cerebellum. 2. Extensive chronic small vessel ischemic disease. 3. 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 4. Unchanged severe right A3 stenosis or segmental occlusion. 5. Moderate left P2 stenosis. 6. Limited assessment of the left vertebral artery origin, otherwise negative neck MRA.   COGNITION: Overall cognitive status: Within functional limits for tasks assessed   SENSATION: WFL, however pt notes having some numbness in the R hand.  COORDINATION: WFL  POSTURE: rounded shoulders, forward head, decreased lumbar lordosis, increased thoracic kyphosis, posterior pelvic tilt, and flexed trunk    LOWER EXTREMITY MMT:    MMT Right Eval Left Eval  Hip flexion 4 4  Hip extension    Hip abduction 4 4-  Hip adduction 4 4-  Hip internal rotation    Hip external rotation    Knee flexion 4 4-  Knee extension 4 4-  Ankle dorsiflexion 4 4-  Ankle plantarflexion    Ankle inversion    Ankle eversion    (Blank rows = not tested)  BED MOBILITY:  Not tested  FUNCTIONAL TESTS:  5 times sit to stand: 24.36 sec Timed up and go (TUG): 23.94 sec with walker 2 minute walk test: TBD 10 meter walk test: 20.33 sec with walker Berg Balance Scale: TBD  PATIENT SURVEYS:   Stroke Impact Scale 16 (Copyrighted instrument, [facility])  In the past 2 weeks, how difficult was it to...  Rating Scale 5 = Not difficult at  all 4 = A little difficult 3 = Somewhat difficult 2 = Very difficult 1 = Could not do at all  a. Dress the top part of your body? 4  b. Bathe yourself? 2  c. Get to the toilet on time? 5  d. Control your bladder (not have an accident)? 5  e. Control your bowels (not have an accident)? 5  f. Stand without losing balance? 3  g.  Go shopping? 2  h. Do heavy household chores (e.g. vacuum, laundry or  yard work)? 1  i. Stay sitting without losing your balance? 3  j. Walk without losing your balance? 2  k. Move from a bed to a chair? 3  l. Walk fast? 1  m. Climb one flight of stairs? 1  n. Walk one block? 1  o. Get in and out of a car? 2  p. Carry heavy objects (e.g. bag of groceries) with your  affected hand? 1  Sum:  41/80  MDC (Minimal Detectable Change) is >/=8                                                                                                                                 TREATMENT DATE: 03/12/24  TE- To improve strength, endurance, mobility, and function of specific targeted muscle groups or improve joint range of motion or improve muscle flexibility  Pt session initially having some hip pain so held off on higher level gait activities and performed UE and LE reciprocal movement training on nustep level 1-4 interval training style x 8 min   NMR: To facilitate reeducation of movement, balance, posture, coordination, and/or proprioception/kinesthetic sense.  1 LE on airex other on 6 in step 2 x 30 sec ea   TE- To improve strength, endurance, mobility, and function of specific targeted muscle groups or improve joint range of motion or improve muscle flexibility  LAQ 2 x 10 with 3# AW   TA- To improve functional movements patterns for everyday tasks   Standing hip abduction with 3# AW 2 x 10   Standing step over and return over 4 in hurdle x 10 ea - cues to prevent swinwith UE support   Step up to 4 in step x 10 ea LE with UE support  Shopping cart style  gait after STS x 100 ft to OT session to end PT   Unless otherwise stated, CGA was provided and gait belt donned in order to ensure pt safety'      PATIENT EDUCATION: Education details: Pt educated on role of PT and services provided during current POC, along with prognosis and information about the clinic.  Person educated: Patient and Child(ren) Education method: Explanation Education comprehension: verbalized understanding and returned demonstration  HOME EXERCISE PROGRAM:  Access Code: [email or web address] URL: [email or web address] Date: 10/17/2023 Prepared by: Sidra Simpers  Exercises - Standing March with Counter Support  - 1 x daily - 3-4 x weekly - 3 sets - 10 reps - Standing Knee Flexion with Unilateral Counter Support  - 1 x daily - 7 x weekly - 3 sets - 10 reps - Standing Hip Extension with Unilateral Counter Support  - 1 x daily - 7 x weekly - 3 sets - 10 reps - Standing Hip Abduction with Unilateral Counter Support  - 1 x daily - 7 x weekly - 3 sets - 10 reps - Heel Toe Raises  with Unilateral Counter Support  - 1 x daily - 7 x weekly - 3 sets - 10 reps  GOALS: Goals reviewed with patient? Yes  SHORT TERM GOALS: Target date: 11/15/2023  Pt will be independent with HEP in order to demonstrate increased ability to perform tasks related to occupation/hobbies. Baseline: pt given HEP at eval Goal status: INITIAL  LONG TERM GOALS: Target date: 01/10/2024  1.  Patient (> 80 years old) will complete five times sit to stand test in < 15 seconds indicating an increased LE strength and improved balance. Baseline: 24.36 sec 1/8:27.85 sec Goal status: ONGOING  2.  Patient will improve SIS 16 score to 55   to demonstrate statistically significant improvement in mobility and quality of life as it relates to their CVA.  Baseline: 41 1/8:41 Goal status: INITIAL   3.  Patient will increase Berg Balance score by > 6 points to demonstrate decreased fall risk during functional  activities. Baseline: 33/56 Goal status: INITIAL   4.  Patient will reduce timed up and go to <11 seconds to reduce fall risk and demonstrate improved transfer/gait ability. Baseline: 23.94 sec with walker 1/8: 24.05 sec rollator 26.88 sec with no AD Goal status: INITIAL  5.  Patient will increase 10 meter walk test to >1.73m/s as to improve gait speed for better community ambulation and to reduce fall risk. Baseline: 20.33 sec with walker 1/8:17.72 sec Goal status: INITIAL  6.  Patient will increase 2 minute walk test distance by 50 ft or greater for progression to community ambulator and demonstrate improved gait ability  Baseline: 275'; 83.82 m 1/8:221 ft no supplemental oxygen  Goal status: INITIAL     ASSESSMENT:  CLINICAL IMPRESSION:  The patient is a 79 year old female status post stroke who presented today with mild hip pain that limited tolerance for higher level gait activities. The session focused on controlled reciprocal upper and lower extremity movement training on the NuStep, lower extremity strengthening through long arc quadriceps exercises, neuromuscular reeducation for balance using uneven surfaces, and task specific functional training including standing hip abduction, step over drills, step ups, and gait training. She required contact guard assistance throughout with a gait belt for safety and demonstrated improved participation and effort as the session progressed.  Continued physical therapy remains beneficial as the patient is still relearning coordinated movement patterns, improving lower extremity strength, and rebuilding safe functional mobility following her stroke. Ongoing skilled intervention will help reduce fall risk, enhance gait mechanics, and support her ability to return to meaningful daily activities with greater confidence and independence.    OBJECTIVE IMPAIRMENTS: Abnormal gait, decreased activity tolerance, decreased balance, decreased endurance,  decreased knowledge of use of DME, decreased mobility, difficulty walking, decreased strength, impaired sensation, and pain.   ACTIVITY LIMITATIONS: carrying, lifting, bending, standing, squatting, stairs, transfers, bathing, toileting, dressing, hygiene/grooming, and locomotion level  PARTICIPATION LIMITATIONS: meal prep, cleaning, laundry, community activity, and yard work  PERSONAL FACTORS: Age and 3+ comorbidities:  PMH: of CVA, COPD, Dysarthria, Ataxia, TIA, T2DM, Back pain, scoliosis are also affecting patient's functional outcome.   REHAB POTENTIAL: Good  CLINICAL DECISION MAKING: Evolving/moderate complexity  EVALUATION COMPLEXITY: Moderate  PLAN:  PT FREQUENCY: 2x/week  PT DURATION: 12 weeks  PLANNED INTERVENTIONS: 97750- Physical Performance Testing, 97110-Therapeutic exercises, 97530- Therapeutic activity, [id number]- Neuromuscular re-education, 97535- Self Care, [id number]- Manual therapy, [phone]- Gait training, Balance training, and Vestibular training  PLAN FOR NEXT SESSION:   Monitor SPO2 and get portable oxygen  if necessary  Endurance, balance,  strength ( L>R), updated HEP, gait training  -Limiting seated rest breaks as tolerated to challenge endurance  Note: Portions of this document were prepared using Dragon voice recognition software and although reviewed may contain unintentional dictation errors in syntax, grammar, or spelling.  03/12/24, 1:25 PM  [name] PT ,DPT Physical Therapist- [place]  [facility]     "

## 2024-03-12 NOTE — Therapy (Signed)
 " Occupational Therapy Progress Note  Dates of reporting period  01/03/24   to   03/12/24   Patient Name: Hannah Kirby MRN: [id number] DOB:[birth year], 79 y.o., female Today's Date: 03/12/2024  PCP: Dr. [name] Perry REFERRING PROVIDER: Dr. [name] Perry  END OF SESSION:  OT End of Session - 03/12/24 1405     Visit Number 20    Number of Visits 24    Date for Recertification  04/18/24    OT Start Time 1400    OT Stop Time 1445    OT Time Calculation (min) 45 min    Equipment Utilized During Treatment transport chair    Activity Tolerance Patient tolerated treatment well    Behavior During Therapy WFL for tasks assessed/performed         Past Medical History:  Diagnosis Date   Acute hemorrhagic colitis 08/08/2019   Anemia    C. difficile colitis 08/09/2019   CAD (coronary artery disease)    a. 02/2006 PCI: BMS x 2 to RCA, cath o/w without significant coronary disease; b. nuclear stress test 07/2014: No ischemia/infarct; c. 11/2017 MV: no isch/infarct, EF 55-65%; d. 03/2020 NSTEMI/PCI: LM nl, LAD min irregs, RI 25, small, LCX nl, RCA 30p/m ISR, 99d (3.0x15 Resolute Onyx DES).   Cataract 02/04/2018   Cerebrovascular accident (CVA) (HCC) 03/18/2023   dysarthria   Chronic bronchitis (HCC)    secondary to cigarette smoking   FHx: allergies    Goiter    Granulomatous disease    Hernia    Kidney stone on left side 2013   NSTEMI (non-ST elevated myocardial infarction) (HCC) 03/23/2020   Oxygen  deficiency    Panic attacks    PVC's (premature ventricular contractions)    a. 03/2018 Zio: Occas PVCs (2.5%). Triggered events assoc w/ PVC/PAC.   Retinal detachment, left 03/04/2020   Retinal tear 2020   Stroke (HCC) 10/29/2016   mild left side weakness   Stroke (HCC) 08/01/2019   Tobacco abuse    Past Surgical History:  Procedure Laterality Date   ABDOMINAL HYSTERECTOMY     BREAST SURGERY     CATARACT EXTRACTION W/PHACO Right 03/01/2017   Procedure: CATARACT EXTRACTION PHACO  AND INTRAOCULAR LENS PLACEMENT (IOC) RIGHT DIABETIC;  Surgeon: Mittie Gaskin, MD;  Location: [facility];  Service: Ophthalmology;  Laterality: Right;   CATARACT EXTRACTION W/PHACO Left 03/22/2017   Procedure: CATARACT EXTRACTION PHACO AND INTRAOCULAR LENS PLACEMENT (IOC) LEFT DIABETIC;  Surgeon: Mittie Gaskin, MD;  Location: [facility];  Service: Ophthalmology;  Laterality: Left;  Diabetic - insulin  and oral meds   COLONOSCOPY WITH PROPOFOL  N/A 09/23/2014   Procedure: COLONOSCOPY WITH PROPOFOL ;  Surgeon: Gladis RAYMOND Mariner, MD;  Location: [facility] ENDOSCOPY;  Service: Endoscopy;  Laterality: N/A;   COLONOSCOPY WITH PROPOFOL  N/A 01/11/2018   Procedure: COLONOSCOPY WITH PROPOFOL ;  Surgeon: Mariner Gladis RAYMOND, MD;  Location: [facility] ENDOSCOPY;  Service: Endoscopy;  Laterality: N/A;   COLONOSCOPY WITH PROPOFOL  N/A 04/24/2018   Procedure: COLONOSCOPY WITH PROPOFOL ;  Surgeon: Mariner Gladis RAYMOND, MD;  Location: [facility] ENDOSCOPY;  Service: Endoscopy;  Laterality: N/A;   COLONOSCOPY WITH PROPOFOL  N/A 11/19/2019   Procedure: COLONOSCOPY WITH PROPOFOL ;  Surgeon: Jinny Carmine, MD;  Location: [facility] ENDOSCOPY;  Service: Endoscopy;  Laterality: N/A;   CORONARY ANGIOPLASTY WITH STENT PLACEMENT  2008   CORONARY STENT INTERVENTION N/A 03/24/2020   Procedure: CORONARY STENT INTERVENTION;  Surgeon: Mady Bruckner, MD;  Location: [facility] INVASIVE CV LAB;  Service: Cardiovascular;  Laterality: N/A;   ESOPHAGOGASTRODUODENOSCOPY (EGD) WITH  PROPOFOL  N/A 12/30/2014   Procedure: ESOPHAGOGASTRODUODENOSCOPY (EGD) WITH PROPOFOL ;  Surgeon: Gladis RAYMOND Mariner, MD;  Location: [facility] ENDOSCOPY;  Service: Endoscopy;  Laterality: N/A;   ESOPHAGOGASTRODUODENOSCOPY (EGD) WITH PROPOFOL  N/A 07/19/2016   Procedure: ESOPHAGOGASTRODUODENOSCOPY (EGD) WITH PROPOFOL ;  Surgeon: Mariner Gladis RAYMOND, MD;  Location: [facility];  Service: Endoscopy;  Laterality: N/A;   ESOPHAGOGASTRODUODENOSCOPY (EGD) WITH PROPOFOL  N/A  04/24/2018   Procedure: ESOPHAGOGASTRODUODENOSCOPY (EGD) WITH PROPOFOL ;  Surgeon: Mariner Gladis RAYMOND, MD;  Location: [facility] ENDOSCOPY;  Service: Endoscopy;  Laterality: N/A;   EYE SURGERY  cataracts, detached retina   hysterectomy (other)     LEFT HEART CATH AND CORONARY ANGIOGRAPHY N/A 03/24/2020   Procedure: LEFT HEART CATH AND CORONARY ANGIOGRAPHY;  Surgeon: Mady Bruckner, MD;  Location: [facility] INVASIVE CV LAB;  Service: Cardiovascular;  Laterality: N/A;   Patient Active Problem List   Diagnosis Date Noted   Stroke (HCC) 08/05/2023   Depression with anxiety 08/05/2023   Overweight (BMI 25.0-29.9) 08/05/2023   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) 03/17/2023   Dizziness 02/23/2023   Restrictive airway disease 07/21/2020   Partial thickness rotator cuff tear 03/20/2020   Shoulder pain 03/05/2020   Ataxia 02/28/2020   Difficulty walking 02/28/2020   Physical deconditioning 02/28/2020   Chronic diastolic congestive heart failure (HCC) 11/20/2019   Polyp of ascending colon    Hypokalemia 08/08/2019   TIA (transient ischemic attack) 08/01/2019   Benign hypertensive kidney disease with chronic kidney disease 04/25/2019   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/24/2018   Chronic, continuous use of opioids 03/26/2018   History of adenomatous polyp of colon 01/22/2018   Diverticulosis of colon 01/22/2018   History of CVA (cerebrovascular accident) 11/08/2016   Weakness of left upper extremity 10/30/2016   AAA (abdominal aortic aneurysm) without rupture 08/12/2016   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   Stage 3b chronic kidney disease (HCC) 07/27/2015   Type 2 diabetes mellitus with diabetic chronic kidney disease (HCC) 10/28/2014   Solitary pulmonary nodule on lung CT 08/20/2014   Allergic rhinitis 07/28/2014   Acid reflux 07/28/2014   Chronic obstructive pulmonary disease (HCC) 07/28/2014   Granuloma annulare    Back pain 11/12/2013   Lumbar  scoliosis 10/30/2013   Lumbar radiculopathy 10/30/2013   Lumbar canal stenosis 10/30/2013   Proteinuria 10/22/2013   Smokes with greater than 30 pack year history 03/21/2011   Edema 08/18/2010   Hyperlipidemia 03/25/2009   Coronary artery disease 03/25/2009   Primary hypertension 03/25/2009   ONSET DATE: 08/05/2023  REFERRING DIAG: M70.101 (ICD-10-CM) - Weakness of left upper extremity   THERAPY DIAG:  Muscle weakness (generalized)  Rationale for Evaluation and Treatment: Rehabilitation  SUBJECTIVE:  SUBJECTIVE STATEMENT: Pt./daughter report that they left the rollator in the car.  Pt accompanied by: family member: daughter, Harrie  PERTINENT HISTORY: Daughter present and provided hx.  Daughter reports pt has had 4 strokes, beginning in 2018, 2019, January of 2025 and the most recent in June of 2025.  Pt had been participating in [facility] since the most recent CVA, and is now eager to transition to outpatient services where PT/OT/SLP have been ordered. Per MEDICAL RECORD NUMBERPMH: of CVA, COPD, Dysarthria, Ataxia, TIA, T2DM, Back pain, scoliosis   PRECAUTIONS: Fall, Loop recorder for Afib  WEIGHT BEARING RESTRICTIONS: No  PAIN:  03/12/24: No pain 03/05/24: No pain 02/29/24: No reports of pain. 02/27/24: 4/10 back pain Are you having pain? Yes: NPRS scale: 3/10, can get up to 7-8/10 Pain location: low back and L  hip Pain description: achy, sharp, dull Aggravating factors: prolonged standing  Relieving factors: sitting, pain meds, heat/ice   FALLS: Has patient fallen in last 6 months? Yes. Number of falls 1, tripping over cords  LIVING ENVIRONMENT: Lives with: lives with their spouse Lives in: 1 level home Stairs: Yes: External: 1 steps; none Has following equipment at home: walk in shower, comfort height toilet   PLOF: Prior to Jan of this year, pt was managing all home management tasks, was able to drive, and was caring for spouse   PATIENT GOALS: Strengthening the left arm    OBJECTIVE:  Note: Objective measures were completed at Evaluation unless otherwise noted.  HAND DOMINANCE: Right  ADLs: Overall ADLs: Daughter is Pt's primary caregiver and lives near by Transfers/ambulation related to ADLs: occasional help with using walker at home, depending on balance on a given day Eating: indep/able to cut food  Grooming: bimanual grooming without difficulty UB Dressing: extra time for clothing fasteners, but able to gather clothing with RW LB Dressing: extra time with clothing fasteners Toileting: occasional help to walk to the bathroom, but modified indep within the bathroom for toileting tasks Bathing: direct supv for bathing using walkin in shower, shower chair, and grab bar; gets hair washed weekly at salon Tub Shower transfers: supv for walk in shower Equipment: RW, SBQC, shower chair, grab bars  IADLs: Shopping: daughter currently managing Light housekeeping: daughter currently managing; can start laundry on a good day.  Meal Prep: starting to participate in light meal prep, including making eggs on stove top Community mobility: daughter assists with all transportation; pt using transport chair to reach therapy gym today Medication management: daughter sets up pills 2 weeks at a time  Financial management: pt is participating in bill paying with daughter's assistance; most are online Handwriting: 100% legible  POSTURE COMMENTS:  rounded shoulders, forward head, increased thoracic kyphosis  ACTIVITY TOLERANCE: Activity tolerance: to be further assessed within functional contexts  FUNCTIONAL OUTCOME MEASURES: TBD  UPPER EXTREMITY ROM:  BUEs WFL for daily tasks  UPPER EXTREMITY MMT:  Hx of R rotator cuff tear 4-5 years ago with residual weakness, non-surgical;    MMT Right eval Right 01/03/24 Right 03/12/24 Left eval Left 01/03/24 Left 03/12/24  Shoulder flexion 3+ 3+ 3+ 4 4 4   Shoulder abduction 3+ 3+ 3+ 4 4 4   Shoulder adduction         Shoulder extension        Shoulder internal rotation 3+ 3+ 3+ 4- 4 4  Shoulder external rotation 3+ 3+ 3+ 3+ 4- 4-  Elbow flexion 5 5 5 5 5 5   Elbow extension 5 5 5 5 5 5   Wrist flexion 4+ 5 5 5 5 5   Wrist extension 4+ 5 5 5 5 5   Wrist ulnar deviation        Wrist radial deviation        Wrist pronation        Wrist supination        (Blank rows = not tested)   *Most recent CVA affecting L non-dominant side, with LUE presenting with increased strength as compared to dominant/unaffected arm.  Assume RUE weaker from hx of rotator cuff tear, or possibly previous CVAs.  Pt does present with mild LUE ataxia, and bilat hand weakness.  HAND FUNCTION: Grip strength: Right: 32 lbs; Left: 30 lbs, Lateral pinch: Right: 8 lbs, Left: 6 lbs, and 3 point pinch: Right: 7 lbs, Left: 6 lbs Grip strength: Right: 40  lbs; Left: 30 lbs, Lateral pinch: Right: 16 lbs, Left: 12; 3 point pinch: Right: 18 lbs, Left: 13 lbs  01/25/24: Right: 35 lbs; Left: 30 lbs, Lateral pinch: Right 7 lbs, Left: 5 lbs, and 3 point pinch: Right: 7 lbs, Left: 4 lbs  03/12/24: Right: 35 lbs; Left: 30 lbs, Lateral pinch: Right 8 lbs, Left: 6 lbs, and 3 point pinch: Right: 7 lbs, Left: 4 lbs       COORDINATION: Finger Nose Finger test: LUE mild ataxia  9 Hole Peg test: Right: 29 sec; Left: 47 sec 01/03/24: Right: 27 sec, Left: 37 sec  01/25/24: Right 27 sec, Left: 35 sec  03/12/24:01/25/24: Right 26 sec, Left: 36 sec    SENSATION: Pt reports intermittent numbness in the R thumb, IF, and LF  EDEMA: No visible edema  MUSCLE TONE: RUE: Within functional limits and LUE: Within functional limits  COGNITION: Overall cognitive status: Impaired; see SLP eval for details  VISION: Pt reports L eye feels more blurry since first stroke in 2018; pt has reading glasses but doesn't usually wear them   VISION ASSESSMENT: To be further assessed in functional context Tracking/Visual pursuits: Able to track stimulus in all quads without  difficulty Saccades: WFL Visual Fields: no apparent deficits Clock drawing completed: all numbers spaced evenly, but pt left out the 1 and verbalized not knowing how to draw ten minutes to eleven when this direction was given.  Patient has difficulty with following activities due to following visual impairments: To be further assessed  PERCEPTION: To be further assessed  PRAXIS: Impaired: Motor planning  OBSERVATIONS: Pt. pleasant, cooperative.  Daughter present and very supportive in pt's care.                                                                                                                       TREATMENT DATE:03/12/24:   Measurements were obtained, and goals were reviewed with the Pt.  PATIENT EDUCATION: Education details: EC/laundry strategies Person educated: Patient Education method: Programmer, Multimedia, Demonstration, and Verbal cues Education comprehension: verbalized understanding, returned demonstration, verbal cues required, and needs further education  HOME EXERCISE PROGRAM: Yellow theraputty  GOALS: Goals reviewed with patient? Yes  SHORT TERM GOALS: Target date: 03/07/24  Pt will perform HEP with min vc or less for improving LUE coordination and bilat hand strength. Baseline: Eval: HEP not yet initiated; 01/03/24: Pt acknowledges minimal participation in HEP in the home, but does have HEP in place with putty and coordination training activities; 01/25/24: see 01/03/24 Goal status: d/c/ not achieved  2.  Pt will be indep to verbalize and implement 2-3 fall prevention measures to reduce fall risk with ADLs. Baseline: Eval: Educ not yet initiated; 01/03/24: 1 fall last week after reaching to floor level to pick up soiled dog pad while dizzy; pt able to verbalize 3 fall prevention strategies with min vc; 01/25/24: pt uses countertop for support while bending/reaching/ambulating in kitchen, but is inconsistent with walker in the home 03/13/24: Pt. Requires cues  for  identifying 1 fall prevention measure.  Goal status: in progress  LONG TERM GOALS: Target date: 04/18/24  Pt will perform shower transfers with modified indep Baseline: Eval: Direct supv; 01/03/24: direct supv from spouse, which pt plans to continue Goal status: d/c  2.  Pt will improve LUE GMC to enable reaching with good accuracy into overhead kitchen cabinets. Baseline: Eval: Mild LUE ataxia; 01/03/24: Residual mild LUE ataxia and pt denies attempts at reaching into kitchen cupboards d/t family managing meals; 01/25/24: Demonstrated today with good accuracy  Goal status: achieved  3.  Pt will improve L hand [facility] skills as demonstrated by 10 sec or more improvement in 9 hole peg test for improved efficiency when manipulating  clothing fasteners. Baseline: Eval: L 9 hole peg test: L 47 sec, R 29 sec; 01/03/24: L 37 sec, R 27 sec, indep with clothing fasteners Goal status: achieved  4.  Pt will manage light loads of laundry with modified indep. Baseline: Eval: Daughter mostly manages, but pt can start laundry on a good day; 01/03/24: Pt can start laundry, and has been given recommendations for safe reaching strategies and item transport for increasing indep with this task, but has not yet attempted these strategies at home. Pt continues to rely on family to manage laundry tasks; 01/25/24: Same as 01/03/24 as daughter reports she has not yet implemented recommendations for de-cluttering laundry room to allow space for a chair 03/12/24: Pt. Is able to manage washing 1-2 small items independently in the washer and dryer with RPE 5 (challenging activity). Goal status: in progress  5. Pt will perform hot or cold light meal prep with distant supv and RW or rollator for at least 1 meal per day with reported RPE of 6 or less on modified Borg scale. Baseline: 01/03/24: Family currently manages meal preparation; 01/25/24: Pt has started to use microwave and occasionally stove top to reheat food, but  verbalizes taxing effort; RPE 7/10 (vigorous activity) on modified Borg scale. 03/13/24: Pt. Continues to use the microwave the microwave, and the stovetop however continues to fatigue.   Goal status Ongoing     6.  Pt will verbalize and implement 1-2 EC strategies with min vc or less to enable light sweeping and mopping of kitchen floor with reported RPE of 6 or less on modified Borg scale.      Baseline: 01/25/24: RPE 7-8.  Often relies on daughter. 03/12/24: Sweeping/mopping RPE 6 (Hard Activity)    Goal status: Ongoing      7. Pt. Will brush her teeth while standing at the sinkside with an RPE level 3(Easy Activity)    Baseline: RPE6 ( Hard Activity)    Goal Status: New    8. Pt. Will wash dishes while standing at the sinkside with an RPE level 3 (Easy Activity)    Baseline: RPE 5 (Challenging Activity)    Goal Status: New   ASSESSMENT: CLINICAL IMPRESSION:  Pt. arrived to the clinic without the rollator walker this afternoon reporting that they left it in the trunk of the car. Pt. reports that she has not been using it at home. Pt. reports that she has been walking without it at home, and has been cruising furniture when walking from the living room/bedroom to the bathroom. Pt. has made progress with activity tolerance of this past progress reporting period as evidence by improvements with RPE levels during household tasks. Pt. continues to work on improving activity tolerance in standing for homemaking tasks, and meal preparation  tasks with new goals added to work on improving activity tolerance in standing for oral care tasks, and washing dishes.  PERFORMANCE DEFICITS: in functional skills including ADLs, IADLs, coordination, dexterity, sensation, ROM, strength, pain, Fine motor control, Gross motor control, mobility, balance, body mechanics, endurance, decreased knowledge of precautions, decreased knowledge of use of DME, vision, and UE functional use, cognitive skills including emotional,  perception, problem solving, safety awareness, temperament/personality, and understand, and psychosocial skills including coping strategies, environmental adaptation, and routines and behaviors.   IMPAIRMENTS: are limiting patient from ADLs, IADLs, leisure, and social participation.   CO-MORBIDITIES: has co-morbidities such as afib, AAA, hx of multiple CVAs that affects occupational performance. Patient will benefit from skilled OT to address above impairments and improve overall function.  MODIFICATION OR ASSISTANCE TO COMPLETE EVALUATION: Min-Moderate modification of tasks or assist with assess necessary to complete an evaluation.  OT OCCUPATIONAL PROFILE AND HISTORY: Detailed assessment: Review of records and additional review of physical, cognitive, psychosocial history related to current functional performance.  CLINICAL DECISION MAKING: Moderate - several treatment options, min-mod task modification necessary  REHAB POTENTIAL: Good  EVALUATION COMPLEXITY: Moderate    PLAN:  OT FREQUENCY: 1x/week  OT DURATION: 12 weeks  PLANNED INTERVENTIONS: 97168 OT Re-evaluation, 97535 self care/ADL training, 02889 therapeutic exercise, 97530 therapeutic activity, 97112 neuromuscular re-education, 97140 manual therapy, 97116 gait training, 02989 moist heat, 97010 cryotherapy, 97129 Cognitive training (first 15 min), 02249 Physical Performance Testing, balance training, functional mobility training, visual/perceptual remediation/compensation, psychosocial skills training, energy conservation, coping strategies training, patient/family education, and DME and/or AE instructions  RECOMMENDED OTHER SERVICES: none at this time (OT/PT/SLP services have all been ordered)  CONSULTED AND AGREED WITH PLAN OF CARE: Patient and family member/caregiver  PLAN FOR NEXT SESSION: see above  Richardson Otter, MS, OTR/L   03/12/2024, 2:07 PM     "

## 2024-03-14 ENCOUNTER — Ambulatory Visit
Admission: RE | Admit: 2024-03-14 | Discharge: 2024-03-14 | Disposition: A | Discharge status: Home / Self Care | Source: Ambulatory Visit | Attending: Neurology | Admitting: Neurology

## 2024-03-14 ENCOUNTER — Ambulatory Visit: Admitting: Occupational Therapy

## 2024-03-14 ENCOUNTER — Telehealth: Payer: Self-pay | Admitting: Physical Therapy

## 2024-03-14 ENCOUNTER — Ambulatory Visit

## 2024-03-14 ENCOUNTER — Ambulatory Visit: Admitting: Physical Therapy

## 2024-03-14 DIAGNOSIS — R413 Other amnesia: Secondary | ICD-10-CM | POA: Diagnosis present

## 2024-03-14 DIAGNOSIS — Z8673 Personal history of transient ischemic attack (TIA), and cerebral infarction without residual deficits: Secondary | ICD-10-CM | POA: Diagnosis present

## 2024-03-14 NOTE — Telephone Encounter (Signed)
 Contacted pt caregiver regarding missed appointment. Pt had MRI this AM and forgot to cancel appointment. Spoke of importance of office communication when unable to attend.   Note: Portions of this document were prepared using Dragon voice recognition software and although reviewed may contain unintentional dictation errors in syntax, grammar, or spelling.  [name]  ,DPT Physical Therapist- [place]  [facility]

## 2024-03-14 NOTE — Therapy (Unsigned)
 " OUTPATIENT PHYSICAL THERAPY NEURO TREATMENT   Patient Name: Hannah Kirby MRN: [id number] DOB:05/28/1945, 79 y.o., female Today's Date: 03/14/2024   PCP: Gasper [name] BRAVO, MD REFERRING PROVIDER: Gasper [name] BRAVO, MD  END OF SESSION:         Past Medical History:  Diagnosis Date   Acute hemorrhagic colitis 08/08/2019   Anemia    C. difficile colitis 08/09/2019   CAD (coronary artery disease)    a. 02/2006 PCI: BMS x 2 to RCA, cath o/w without significant coronary disease; b. nuclear stress test 07/2014: No ischemia/infarct; c. 11/2017 MV: no isch/infarct, EF 55-65%; d. 03/2020 NSTEMI/PCI: LM nl, LAD min irregs, RI 25, small, LCX nl, RCA 30p/m ISR, 99d (3.0x15 Resolute Onyx DES).   Cataract 02/04/2018   Cerebrovascular accident (CVA) (HCC) 03/18/2023   dysarthria   Chronic bronchitis (HCC)    secondary to cigarette smoking   FHx: allergies    Goiter    Granulomatous disease    Hernia    Kidney stone on left side 2013   NSTEMI (non-ST elevated myocardial infarction) (HCC) 03/23/2020   Oxygen  deficiency    Panic attacks    PVC's (premature ventricular contractions)    a. 03/2018 Zio: Occas PVCs (2.5%). Triggered events assoc w/ PVC/PAC.   Retinal detachment, left 03/04/2020   Retinal tear 2020   Stroke (HCC) 10/29/2016   mild left side weakness   Stroke (HCC) 08/01/2019   Tobacco abuse    Past Surgical History:  Procedure Laterality Date   ABDOMINAL HYSTERECTOMY     BREAST SURGERY     CATARACT EXTRACTION W/PHACO Right 03/01/2017   Procedure: CATARACT EXTRACTION PHACO AND INTRAOCULAR LENS PLACEMENT (IOC) RIGHT DIABETIC;  Surgeon: Mittie Gaskin, MD;  Location: [facility];  Service: Ophthalmology;  Laterality: Right;   CATARACT EXTRACTION W/PHACO Left 03/22/2017   Procedure: CATARACT EXTRACTION PHACO AND INTRAOCULAR LENS PLACEMENT (IOC) LEFT DIABETIC;  Surgeon: Mittie Gaskin, MD;  Location: [facility];  Service: Ophthalmology;  Laterality:  Left;  Diabetic - insulin  and oral meds   COLONOSCOPY WITH PROPOFOL  N/A 09/23/2014   Procedure: COLONOSCOPY WITH PROPOFOL ;  Surgeon: Gladis RAYMOND Mariner, MD;  Location: [facility] ENDOSCOPY;  Service: Endoscopy;  Laterality: N/A;   COLONOSCOPY WITH PROPOFOL  N/A 01/11/2018   Procedure: COLONOSCOPY WITH PROPOFOL ;  Surgeon: Mariner Gladis RAYMOND, MD;  Location: [facility] ENDOSCOPY;  Service: Endoscopy;  Laterality: N/A;   COLONOSCOPY WITH PROPOFOL  N/A 04/24/2018   Procedure: COLONOSCOPY WITH PROPOFOL ;  Surgeon: Mariner Gladis RAYMOND, MD;  Location: [facility] ENDOSCOPY;  Service: Endoscopy;  Laterality: N/A;   COLONOSCOPY WITH PROPOFOL  N/A 11/19/2019   Procedure: COLONOSCOPY WITH PROPOFOL ;  Surgeon: Jinny Carmine, MD;  Location: [facility] ENDOSCOPY;  Service: Endoscopy;  Laterality: N/A;   CORONARY ANGIOPLASTY WITH STENT PLACEMENT  2008   CORONARY STENT INTERVENTION N/A 03/24/2020   Procedure: CORONARY STENT INTERVENTION;  Surgeon: Mady Bruckner, MD;  Location: [facility] INVASIVE CV LAB;  Service: Cardiovascular;  Laterality: N/A;   ESOPHAGOGASTRODUODENOSCOPY (EGD) WITH PROPOFOL  N/A 12/30/2014   Procedure: ESOPHAGOGASTRODUODENOSCOPY (EGD) WITH PROPOFOL ;  Surgeon: Gladis RAYMOND Mariner, MD;  Location: [facility] ENDOSCOPY;  Service: Endoscopy;  Laterality: N/A;   ESOPHAGOGASTRODUODENOSCOPY (EGD) WITH PROPOFOL  N/A 07/19/2016   Procedure: ESOPHAGOGASTRODUODENOSCOPY (EGD) WITH PROPOFOL ;  Surgeon: Mariner Gladis RAYMOND, MD;  Location: [facility] ENDOSCOPY;  Service: Endoscopy;  Laterality: N/A;   ESOPHAGOGASTRODUODENOSCOPY (EGD) WITH PROPOFOL  N/A 04/24/2018   Procedure: ESOPHAGOGASTRODUODENOSCOPY (EGD) WITH PROPOFOL ;  Surgeon: Mariner Gladis RAYMOND, MD;  Location: [facility] ENDOSCOPY;  Service: Endoscopy;  Laterality: N/A;  EYE SURGERY  cataracts, detached retina   hysterectomy (other)     LEFT HEART CATH AND CORONARY ANGIOGRAPHY N/A 03/24/2020   Procedure: LEFT HEART CATH AND CORONARY ANGIOGRAPHY;  Surgeon: Mady Bruckner, MD;  Location: [facility] INVASIVE CV  LAB;  Service: Cardiovascular;  Laterality: N/A;   Patient Active Problem List   Diagnosis Date Noted   Stroke (HCC) 08/05/2023   Depression with anxiety 08/05/2023   Overweight (BMI 25.0-29.9) 08/05/2023   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) 03/17/2023   Dizziness 02/23/2023   Restrictive airway disease 07/21/2020   Partial thickness rotator cuff tear 03/20/2020   Shoulder pain 03/05/2020   Ataxia 02/28/2020   Difficulty walking 02/28/2020   Physical deconditioning 02/28/2020   Chronic diastolic congestive heart failure (HCC) 11/20/2019   Polyp of ascending colon    Hypokalemia 08/08/2019   TIA (transient ischemic attack) 08/01/2019   Benign hypertensive kidney disease with chronic kidney disease 04/25/2019   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/24/2018   Chronic, continuous use of opioids 03/26/2018   History of adenomatous polyp of colon 01/22/2018   Diverticulosis of colon 01/22/2018   History of CVA (cerebrovascular accident) 11/08/2016   Weakness of left upper extremity 10/30/2016   AAA (abdominal aortic aneurysm) without rupture 08/12/2016   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   Stage 3b chronic kidney disease (HCC) 07/27/2015   Type 2 diabetes mellitus with diabetic chronic kidney disease (HCC) 10/28/2014   Solitary pulmonary nodule on lung CT 08/20/2014   Allergic rhinitis 07/28/2014   Acid reflux 07/28/2014   Chronic obstructive pulmonary disease (HCC) 07/28/2014   Granuloma annulare    Back pain 11/12/2013   Lumbar scoliosis 10/30/2013   Lumbar radiculopathy 10/30/2013   Lumbar canal stenosis 10/30/2013   Proteinuria 10/22/2013   Smokes with greater than 30 pack year history 03/21/2011   Edema 08/18/2010   Hyperlipidemia 03/25/2009   Coronary artery disease 03/25/2009   Primary hypertension 03/25/2009    ONSET DATE: 08/05/23  REFERRING DIAG:  Z86.73 (ICD-10-CM) - History of CVA (cerebrovascular accident)  R27.0  (ICD-10-CM) - Ataxia    THERAPY DIAG:   No diagnosis found.  Rationale for Evaluation and Treatment: Rehabilitation  SUBJECTIVE:                                                                                                                                                                                             SUBJECTIVE STATEMENT:  Pt doing well and is accompanied by daughter. No reported changes since last session. Some hip pain noted.   Pt accompanied by: self and family member, daughter  PERTINENT HISTORY:  PMH: of CVA, COPD, Dysarthria, Ataxia, TIA, T2DM, Back pain, scoliosis   PAIN:  Are you having pain? Yes: NPRS scale: 3/10 Pain location: low back and L hip Pain description: achy Aggravating factors: just hurts all the time. Relieving factors: hydrocodone  at night  PRECAUTIONS: Other: Pt has a looper to see if pt has a-fib  RED FLAGS: Abdominal aneurysm: Yes: pt reports having an abdominal aneurism as found by Dr. Marea.   WEIGHT BEARING RESTRICTIONS: No  FALLS: Has patient fallen in last 6 months? Yes. Number of falls 1.  Pt reports losing balance, and got caught up in cords that were on the floor when she was trying to close the blinds.  LIVING ENVIRONMENT: Lives with: lives with their spouse Lives in: House/apartment Stairs: Yes: External: 1 steps; none Has following equipment at home: Single point cane, Walker - 2 wheeled, Environmental Consultant - 4 wheeled, shower chair, and Grab bars  PLOF: Independent  PATIENT GOALS: Pt is wanting to gain independence.  Pt is wanting to improve endurance levels and improving speech with SLP.  OBJECTIVE:  Note: Objective measures were completed at Evaluation unless otherwise noted.  DIAGNOSTIC FINDINGS:   EXAM: MRI HEAD WITHOUT CONTRAST   MRA HEAD WITHOUT CONTRAST   MRA NECK WITHOUT AND WITH CONTRAST   IMPRESSION: 1. Small acute infarcts in the left frontal lobe, right parietal lobe, and left cerebellum. 2. Extensive  chronic small vessel ischemic disease. 3. 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 4. Unchanged severe right A3 stenosis or segmental occlusion. 5. Moderate left P2 stenosis. 6. Limited assessment of the left vertebral artery origin, otherwise negative neck MRA.   COGNITION: Overall cognitive status: Within functional limits for tasks assessed   SENSATION: WFL, however pt notes having some numbness in the R hand.  COORDINATION: WFL  POSTURE: rounded shoulders, forward head, decreased lumbar lordosis, increased thoracic kyphosis, posterior pelvic tilt, and flexed trunk    LOWER EXTREMITY MMT:    MMT Right Eval Left Eval  Hip flexion 4 4  Hip extension    Hip abduction 4 4-  Hip adduction 4 4-  Hip internal rotation    Hip external rotation    Knee flexion 4 4-  Knee extension 4 4-  Ankle dorsiflexion 4 4-  Ankle plantarflexion    Ankle inversion    Ankle eversion    (Blank rows = not tested)  BED MOBILITY:  Not tested  FUNCTIONAL TESTS:  5 times sit to stand: 24.36 sec Timed up and go (TUG): 23.94 sec with walker 2 minute walk test: TBD 10 meter walk test: 20.33 sec with walker Berg Balance Scale: TBD  PATIENT SURVEYS:   Stroke Impact Scale 16 (Copyrighted instrument, [employer])  In the past 2 weeks, how difficult was it to...  Rating Scale 5 = Not difficult at all 4 = A little difficult 3 = Somewhat difficult 2 = Very difficult 1 = Could not do at all  a. Dress the top part of your body? 4  b. Bathe yourself? 2  c. Get to the toilet on time? 5  d. Control your bladder (not have an accident)? 5  e. Control your bowels (not have an accident)? 5  f. Stand without losing balance? 3  g. Go shopping? 2  h. Do heavy household chores (e.g. vacuum, laundry or  yard work)? 1  i. Stay sitting without losing your balance? 3  j. Walk without losing your balance? 2  k.  Move from a bed to a chair? 3  l. Walk fast? 1  m.  Climb one flight of stairs? 1  n. Walk one block? 1  o. Get in and out of a car? 2  p. Carry heavy objects (e.g. bag of groceries) with your  affected hand? 1  Sum:  41/80  MDC (Minimal Detectable Change) is >/=8                                                                                                                                 TREATMENT DATE: 03/14/24   Write a clinical impression based on the information below. Keep it to one or two paragraphs and include why continued physical therapy is beneficial. Patient Description: 79 y.o femal s/p stroke Interventions Performed:    TE- To improve strength, endurance, mobility, and function of specific targeted muscle groups or improve joint range of motion or improve muscle flexibility  Pt session initially having some hip pain so held off on higher level gait activities and performed UE and LE reciprocal movement training on nustep level 1-4 interval training style x 8 min   NMR: To facilitate reeducation of movement, balance, posture, coordination, and/or proprioception/kinesthetic sense.  1 LE on airex other on 6 in step 2 x 30 sec ea   TE- To improve strength, endurance, mobility, and function of specific targeted muscle groups or improve joint range of motion or improve muscle flexibility  LAQ 2 x 10 with 3# AW   TA- To improve functional movements patterns for everyday tasks   Standing hip abduction with 3# AW 2 x 10   Standing step over and return over 4 in hurdle x 10 ea - cues to prevent swinwith UE support   Step up to 4 in step x 10 ea LE with UE support  Shopping cart style gait after STS x 100 ft to OT session to end PT   Unless otherwise stated, CGA was provided and gait belt donned in order to ensure pt safety'      PATIENT EDUCATION: Education details: Pt educated on role of PT and services provided during current POC, along with prognosis and information about the clinic.  Person educated:  Patient and Child(ren) Education method: Explanation Education comprehension: verbalized understanding and returned demonstration  HOME EXERCISE PROGRAM:  Access Code: [email or web address] URL: [email or web address] Date: 10/17/2023 Prepared by: Sidra Simpers  Exercises - Standing March with Counter Support  - 1 x daily - 3-4 x weekly - 3 sets - 10 reps - Standing Knee Flexion with Unilateral Counter Support  - 1 x daily - 7 x weekly - 3 sets - 10 reps - Standing Hip Extension with Unilateral Counter Support  - 1 x daily - 7 x weekly - 3 sets - 10 reps - Standing Hip Abduction with Unilateral Counter Support  - 1 x daily - 7 x weekly - 3 sets - 10 reps - Heel  Toe Raises with Unilateral Counter Support  - 1 x daily - 7 x weekly - 3 sets - 10 reps  GOALS: Goals reviewed with patient? Yes  SHORT TERM GOALS: Target date: 11/15/2023  Pt will be independent with HEP in order to demonstrate increased ability to perform tasks related to occupation/hobbies. Baseline: pt given HEP at eval Goal status: INITIAL  LONG TERM GOALS: Target date: 01/10/2024  1.  Patient (> 70 years old) will complete five times sit to stand test in < 15 seconds indicating an increased LE strength and improved balance. Baseline: 24.36 sec 1/8:27.85 sec Goal status: ONGOING  2.  Patient will improve SIS 16 score to 55   to demonstrate statistically significant improvement in mobility and quality of life as it relates to their CVA.  Baseline: 41 1/8:41 Goal status: INITIAL   3.  Patient will increase Berg Balance score by > 6 points to demonstrate decreased fall risk during functional activities. Baseline: 33/56 Goal status: INITIAL   4.  Patient will reduce timed up and go to <11 seconds to reduce fall risk and demonstrate improved transfer/gait ability. Baseline: 23.94 sec with walker 1/8: 24.05 sec rollator 26.88 sec with no AD Goal status: INITIAL  5.  Patient will increase 10 meter walk test to >1.40m/s  as to improve gait speed for better community ambulation and to reduce fall risk. Baseline: 20.33 sec with walker 1/8:17.72 sec Goal status: INITIAL  6.  Patient will increase 2 minute walk test distance by 50 ft or greater for progression to community ambulator and demonstrate improved gait ability  Baseline: 275'; 83.82 m 1/8:221 ft no supplemental oxygen  Goal status: INITIAL     ASSESSMENT:  CLINICAL IMPRESSION:  The patient is a 79 year old female status post stroke who presented today with mild hip pain that limited tolerance for higher level gait activities. The session focused on controlled reciprocal upper and lower extremity movement training on the NuStep, lower extremity strengthening through long arc quadriceps exercises, neuromuscular reeducation for balance using uneven surfaces, and task specific functional training including standing hip abduction, step over drills, step ups, and gait training. She required contact guard assistance throughout with a gait belt for safety and demonstrated improved participation and effort as the session progressed.  Continued physical therapy remains beneficial as the patient is still relearning coordinated movement patterns, improving lower extremity strength, and rebuilding safe functional mobility following her stroke. Ongoing skilled intervention will help reduce fall risk, enhance gait mechanics, and support her ability to return to meaningful daily activities with greater confidence and independence.    OBJECTIVE IMPAIRMENTS: Abnormal gait, decreased activity tolerance, decreased balance, decreased endurance, decreased knowledge of use of DME, decreased mobility, difficulty walking, decreased strength, impaired sensation, and pain.   ACTIVITY LIMITATIONS: carrying, lifting, bending, standing, squatting, stairs, transfers, bathing, toileting, dressing, hygiene/grooming, and locomotion level  PARTICIPATION LIMITATIONS: meal prep, cleaning,  laundry, community activity, and yard work  PERSONAL FACTORS: Age and 3+ comorbidities:  PMH: of CVA, COPD, Dysarthria, Ataxia, TIA, T2DM, Back pain, scoliosis are also affecting patient's functional outcome.   REHAB POTENTIAL: Good  CLINICAL DECISION MAKING: Evolving/moderate complexity  EVALUATION COMPLEXITY: Moderate  PLAN:  PT FREQUENCY: 2x/week  PT DURATION: 12 weeks  PLANNED INTERVENTIONS: 97750- Physical Performance Testing, 97110-Therapeutic exercises, 97530- Therapeutic activity, [id number]- Neuromuscular re-education, 97535- Self Care, [id number]- Manual therapy, [phone]- Gait training, Balance training, and Vestibular training  PLAN FOR NEXT SESSION:   Monitor SPO2 and get portable oxygen  if necessary  Endurance, balance, strength ( L>R), updated HEP, gait training  -Limiting seated rest breaks as tolerated to challenge endurance  Note: Portions of this document were prepared using Dragon voice recognition software and although reviewed may contain unintentional dictation errors in syntax, grammar, or spelling.  03/14/24, 1:06 PM  [name] PT ,DPT Physical Therapist- [place]  [facility]     "

## 2024-03-19 ENCOUNTER — Ambulatory Visit

## 2024-03-19 ENCOUNTER — Ambulatory Visit: Admitting: Physical Therapy

## 2024-03-19 ENCOUNTER — Ambulatory Visit: Admitting: Occupational Therapy

## 2024-03-20 ENCOUNTER — Ambulatory Visit: Admitting: Family Medicine

## 2024-03-21 ENCOUNTER — Ambulatory Visit: Admitting: Physical Therapy

## 2024-03-21 ENCOUNTER — Ambulatory Visit: Admitting: Occupational Therapy

## 2024-03-21 ENCOUNTER — Ambulatory Visit

## 2024-03-21 DIAGNOSIS — R2689 Other abnormalities of gait and mobility: Secondary | ICD-10-CM

## 2024-03-21 DIAGNOSIS — R269 Unspecified abnormalities of gait and mobility: Secondary | ICD-10-CM

## 2024-03-21 DIAGNOSIS — R2681 Unsteadiness on feet: Secondary | ICD-10-CM

## 2024-03-21 DIAGNOSIS — M6281 Muscle weakness (generalized): Secondary | ICD-10-CM

## 2024-03-21 DIAGNOSIS — R4701 Aphasia: Secondary | ICD-10-CM

## 2024-03-21 DIAGNOSIS — R482 Apraxia: Secondary | ICD-10-CM

## 2024-03-21 DIAGNOSIS — R262 Difficulty in walking, not elsewhere classified: Secondary | ICD-10-CM

## 2024-03-21 NOTE — Therapy (Signed)
 " OUTPATIENT PHYSICAL THERAPY NEURO TREATMENT   Patient Name: Hannah Kirby MRN: [id number] DOB:[birth year], 79 y.o., female Today's Date: 03/21/2024   PCP: Gasper [name] BRAVO, MD REFERRING PROVIDER: Gasper [name] BRAVO, MD  END OF SESSION:    PT End of Session - 03/21/24 1036     Visit Number 23    Number of Visits 39    Date for Recertification  04/18/24    Progress Note Due on Visit 30    PT Start Time 1100    PT Stop Time 1143    PT Time Calculation (min) 43 min    Equipment Utilized During Treatment Gait belt    Activity Tolerance Patient tolerated treatment well    Behavior During Therapy WFL for tasks assessed/performed              Past Medical History:  Diagnosis Date   Acute hemorrhagic colitis 08/08/2019   Anemia    C. difficile colitis 08/09/2019   CAD (coronary artery disease)    a. 02/2006 PCI: BMS x 2 to RCA, cath o/w without significant coronary disease; b. nuclear stress test 07/2014: No ischemia/infarct; c. 11/2017 MV: no isch/infarct, EF 55-65%; d. 03/2020 NSTEMI/PCI: LM nl, LAD min irregs, RI 25, small, LCX nl, RCA 30p/m ISR, 99d (3.0x15 Resolute Onyx DES).   Cataract 02/04/2018   Cerebrovascular accident (CVA) (HCC) 03/18/2023   dysarthria   Chronic bronchitis (HCC)    secondary to cigarette smoking   FHx: allergies    Goiter    Granulomatous disease    Hernia    Kidney stone on left side 2013   NSTEMI (non-ST elevated myocardial infarction) (HCC) 03/23/2020   Oxygen  deficiency    Panic attacks    PVC's (premature ventricular contractions)    a. 03/2018 Zio: Occas PVCs (2.5%). Triggered events assoc w/ PVC/PAC.   Retinal detachment, left 03/04/2020   Retinal tear 2020   Stroke (HCC) 10/29/2016   mild left side weakness   Stroke (HCC) 08/01/2019   Tobacco abuse    Past Surgical History:  Procedure Laterality Date   ABDOMINAL HYSTERECTOMY     BREAST SURGERY     CATARACT EXTRACTION W/PHACO Right 03/01/2017   Procedure: CATARACT EXTRACTION  PHACO AND INTRAOCULAR LENS PLACEMENT (IOC) RIGHT DIABETIC;  Surgeon: Mittie Gaskin, MD;  Location: [facility];  Service: Ophthalmology;  Laterality: Right;   CATARACT EXTRACTION W/PHACO Left 03/22/2017   Procedure: CATARACT EXTRACTION PHACO AND INTRAOCULAR LENS PLACEMENT (IOC) LEFT DIABETIC;  Surgeon: Mittie Gaskin, MD;  Location: [facility];  Service: Ophthalmology;  Laterality: Left;  Diabetic - insulin  and oral meds   COLONOSCOPY WITH PROPOFOL  N/A 09/23/2014   Procedure: COLONOSCOPY WITH PROPOFOL ;  Surgeon: Gladis RAYMOND Mariner, MD;  Location: [facility] ENDOSCOPY;  Service: Endoscopy;  Laterality: N/A;   COLONOSCOPY WITH PROPOFOL  N/A 01/11/2018   Procedure: COLONOSCOPY WITH PROPOFOL ;  Surgeon: Mariner Gladis RAYMOND, MD;  Location: [facility] ENDOSCOPY;  Service: Endoscopy;  Laterality: N/A;   COLONOSCOPY WITH PROPOFOL  N/A 04/24/2018   Procedure: COLONOSCOPY WITH PROPOFOL ;  Surgeon: Mariner Gladis RAYMOND, MD;  Location: [facility] ENDOSCOPY;  Service: Endoscopy;  Laterality: N/A;   COLONOSCOPY WITH PROPOFOL  N/A 11/19/2019   Procedure: COLONOSCOPY WITH PROPOFOL ;  Surgeon: Jinny Carmine, MD;  Location: [facility] ENDOSCOPY;  Service: Endoscopy;  Laterality: N/A;   CORONARY ANGIOPLASTY WITH STENT PLACEMENT  2008   CORONARY STENT INTERVENTION N/A 03/24/2020   Procedure: CORONARY STENT INTERVENTION;  Surgeon: Mady Bruckner, MD;  Location: [facility] INVASIVE CV LAB;  Service: Cardiovascular;  Laterality: N/A;   ESOPHAGOGASTRODUODENOSCOPY (EGD) WITH PROPOFOL  N/A 12/30/2014   Procedure: ESOPHAGOGASTRODUODENOSCOPY (EGD) WITH PROPOFOL ;  Surgeon: Gladis RAYMOND Mariner, MD;  Location: [facility] ENDOSCOPY;  Service: Endoscopy;  Laterality: N/A;   ESOPHAGOGASTRODUODENOSCOPY (EGD) WITH PROPOFOL  N/A 07/19/2016   Procedure: ESOPHAGOGASTRODUODENOSCOPY (EGD) WITH PROPOFOL ;  Surgeon: Mariner Gladis RAYMOND, MD;  Location: [facility] ENDOSCOPY;  Service: Endoscopy;  Laterality: N/A;   ESOPHAGOGASTRODUODENOSCOPY (EGD) WITH PROPOFOL  N/A  04/24/2018   Procedure: ESOPHAGOGASTRODUODENOSCOPY (EGD) WITH PROPOFOL ;  Surgeon: Mariner Gladis RAYMOND, MD;  Location: [facility] ENDOSCOPY;  Service: Endoscopy;  Laterality: N/A;   EYE SURGERY  cataracts, detached retina   hysterectomy (other)     LEFT HEART CATH AND CORONARY ANGIOGRAPHY N/A 03/24/2020   Procedure: LEFT HEART CATH AND CORONARY ANGIOGRAPHY;  Surgeon: Mady Bruckner, MD;  Location: [facility] INVASIVE CV LAB;  Service: Cardiovascular;  Laterality: N/A;   Patient Active Problem List   Diagnosis Date Noted   Stroke (HCC) 08/05/2023   Depression with anxiety 08/05/2023   Overweight (BMI 25.0-29.9) 08/05/2023   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) 03/17/2023   Dizziness 02/23/2023   Restrictive airway disease 07/21/2020   Partial thickness rotator cuff tear 03/20/2020   Shoulder pain 03/05/2020   Ataxia 02/28/2020   Difficulty walking 02/28/2020   Physical deconditioning 02/28/2020   Chronic diastolic congestive heart failure (HCC) 11/20/2019   Polyp of ascending colon    Hypokalemia 08/08/2019   TIA (transient ischemic attack) 08/01/2019   Benign hypertensive kidney disease with chronic kidney disease 04/25/2019   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/24/2018   Chronic, continuous use of opioids 03/26/2018   History of adenomatous polyp of colon 01/22/2018   Diverticulosis of colon 01/22/2018   History of CVA (cerebrovascular accident) 11/08/2016   Weakness of left upper extremity 10/30/2016   AAA (abdominal aortic aneurysm) without rupture 08/12/2016   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   Stage 3b chronic kidney disease (HCC) 07/27/2015   Type 2 diabetes mellitus with diabetic chronic kidney disease (HCC) 10/28/2014   Solitary pulmonary nodule on lung CT 08/20/2014   Allergic rhinitis 07/28/2014   Acid reflux 07/28/2014   Chronic obstructive pulmonary disease (HCC) 07/28/2014   Granuloma annulare    Back pain 11/12/2013   Lumbar  scoliosis 10/30/2013   Lumbar radiculopathy 10/30/2013   Lumbar canal stenosis 10/30/2013   Proteinuria 10/22/2013   Smokes with greater than 30 pack year history 03/21/2011   Edema 08/18/2010   Hyperlipidemia 03/25/2009   Coronary artery disease 03/25/2009   Primary hypertension 03/25/2009    ONSET DATE: 08/05/23  REFERRING DIAG:  Z86.73 (ICD-10-CM) - History of CVA (cerebrovascular accident)  R27.0 (ICD-10-CM) - Ataxia    THERAPY DIAG:   Abnormality of gait and mobility  Unsteadiness on feet  Other abnormalities of gait and mobility  Difficulty in walking, not elsewhere classified  Muscle weakness (generalized)  Rationale for Evaluation and Treatment: Rehabilitation  SUBJECTIVE:  SUBJECTIVE STATEMENT:  Pt doing well and is accompanied by daughter. No reported changes since last session. Is motivated to walk with rollator today.   Pt accompanied by: self and family member, daughter  PERTINENT HISTORY:  PMH: of CVA, COPD, Dysarthria, Ataxia, TIA, T2DM, Back pain, scoliosis   PAIN:  Are you having pain? Yes: NPRS scale: 3/10 Pain location: low back and L hip Pain description: achy Aggravating factors: just hurts all the time. Relieving factors: hydrocodone  at night  PRECAUTIONS: Other: Pt has a looper to see if pt has a-fib  RED FLAGS: Abdominal aneurysm: Yes: pt reports having an abdominal aneurism as found by Dr. Marea.   WEIGHT BEARING RESTRICTIONS: No  FALLS: Has patient fallen in last 6 months? Yes. Number of falls 1.  Pt reports losing balance, and got caught up in cords that were on the floor when she was trying to close the blinds.  LIVING ENVIRONMENT: Lives with: lives with their spouse Lives in: House/apartment Stairs: Yes: External: 1 steps; none Has following  equipment at home: Single point cane, Walker - 2 wheeled, Environmental Consultant - 4 wheeled, shower chair, and Grab bars  PLOF: Independent  PATIENT GOALS: Pt is wanting to gain independence.  Pt is wanting to improve endurance levels and improving speech with SLP.  OBJECTIVE:  Note: Objective measures were completed at Evaluation unless otherwise noted.  DIAGNOSTIC FINDINGS:   EXAM: MRI HEAD WITHOUT CONTRAST   MRA HEAD WITHOUT CONTRAST   MRA NECK WITHOUT AND WITH CONTRAST   IMPRESSION: 1. Small acute infarcts in the left frontal lobe, right parietal lobe, and left cerebellum. 2. Extensive chronic small vessel ischemic disease. 3. 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 4. Unchanged severe right A3 stenosis or segmental occlusion. 5. Moderate left P2 stenosis. 6. Limited assessment of the left vertebral artery origin, otherwise negative neck MRA.   COGNITION: Overall cognitive status: Within functional limits for tasks assessed   SENSATION: WFL, however pt notes having some numbness in the R hand.  COORDINATION: WFL  POSTURE: rounded shoulders, forward head, decreased lumbar lordosis, increased thoracic kyphosis, posterior pelvic tilt, and flexed trunk    LOWER EXTREMITY MMT:    MMT Right Eval Left Eval  Hip flexion 4 4  Hip extension    Hip abduction 4 4-  Hip adduction 4 4-  Hip internal rotation    Hip external rotation    Knee flexion 4 4-  Knee extension 4 4-  Ankle dorsiflexion 4 4-  Ankle plantarflexion    Ankle inversion    Ankle eversion    (Blank rows = not tested)  BED MOBILITY:  Not tested  FUNCTIONAL TESTS:  5 times sit to stand: 24.36 sec Timed up and go (TUG): 23.94 sec with walker 2 minute walk test: TBD 10 meter walk test: 20.33 sec with walker Berg Balance Scale: TBD  PATIENT SURVEYS:   Stroke Impact Scale 16 (Copyrighted instrument, [facility])  In the past 2 weeks, how difficult was it to...  Rating  Scale 5 = Not difficult at all 4 = A little difficult 3 = Somewhat difficult 2 = Very difficult 1 = Could not do at all  a. Dress the top part of your body? 4  b. Bathe yourself? 2  c. Get to the toilet on time? 5  d. Control your bladder (not have an accident)? 5  e. Control your bowels (not have an accident)? 5  f. Stand without losing balance?  3  g. Go shopping? 2  h. Do heavy household chores (e.g. vacuum, laundry or  yard work)? 1  i. Stay sitting without losing your balance? 3  j. Walk without losing your balance? 2  k. Move from a bed to a chair? 3  l. Walk fast? 1  m. Climb one flight of stairs? 1  n. Walk one block? 1  o. Get in and out of a car? 2  p. Carry heavy objects (e.g. bag of groceries) with your  affected hand? 1  Sum:  41/80  MDC (Minimal Detectable Change) is >/=8                                                                                                                                 TREATMENT DATE: 03/21/24  NMR: To facilitate reeducation of movement, balance, posture, coordination, and/or proprioception/kinesthetic sense.  1 LE on airex other on 6 in step 3 x 30 sec ea   - fatigue noted in breathing SPO2 remains above 92% with all interventions when assessed this date.    TA- To improve functional movements patterns for everyday tasks   The patient completed 8 minutes at level(s) 2-4 on the NuStep using both BUE/BLE reciprocal movements to promote strength, endurance, and cardiorespiratory fitness. PT increased the resistance level and monitored the patient's response to the intervention throughout. The patient required min/mod/max cueing for technique and min level of assistance.   Standing hip abduction with 3# AW 2 x 10   Standing step over and return over 6 in hurdle 2 x 10 ea - cues to prevent with UE support   Step up to 4 in step x 10 ea LE with UE support  Pt required occasional rest breaks due fatigue and SOB, PT was attentive to  when pt appeared to be tired or winded in order to prevent excessive fatigue.  Unless otherwise stated, CGA was provided and gait belt donned in order to ensure pt safety'    PATIENT EDUCATION: Education details: Pt educated on role of PT and services provided during current POC, along with prognosis and information about the clinic.  Person educated: Patient and Child(ren) Education method: Explanation Education comprehension: verbalized understanding and returned demonstration  HOME EXERCISE PROGRAM:  Access Code: [email or web address] URL: [email or web address] Date: 10/17/2023 Prepared by: Sidra Simpers  Exercises - Standing March with Counter Support  - 1 x daily - 3-4 x weekly - 3 sets - 10 reps - Standing Knee Flexion with Unilateral Counter Support  - 1 x daily - 7 x weekly - 3 sets - 10 reps - Standing Hip Extension with Unilateral Counter Support  - 1 x daily - 7 x weekly - 3 sets - 10 reps - Standing Hip Abduction with Unilateral Counter Support  - 1 x daily - 7 x weekly - 3 sets - 10 reps - Heel Toe Raises with Unilateral Counter Support  - 1 x daily -  7 x weekly - 3 sets - 10 reps  GOALS: Goals reviewed with patient? Yes  SHORT TERM GOALS: Target date: 11/15/2023  Pt will be independent with HEP in order to demonstrate increased ability to perform tasks related to occupation/hobbies. Baseline: pt given HEP at eval Goal status: INITIAL  LONG TERM GOALS: Target date: 01/10/2024  1.  Patient (> 32 years old) will complete five times sit to stand test in < 15 seconds indicating an increased LE strength and improved balance. Baseline: 24.36 sec 1/8:27.85 sec Goal status: ONGOING  2.  Patient will improve SIS 16 score to 55   to demonstrate statistically significant improvement in mobility and quality of life as it relates to their CVA.  Baseline: 41 1/8:41 Goal status: INITIAL   3.  Patient will increase Berg Balance score by > 6 points to demonstrate decreased  fall risk during functional activities. Baseline: 33/56 Goal status: INITIAL   4.  Patient will reduce timed up and go to <11 seconds to reduce fall risk and demonstrate improved transfer/gait ability. Baseline: 23.94 sec with walker 1/8: 24.05 sec rollator 26.88 sec with no AD Goal status: INITIAL  5.  Patient will increase 10 meter walk test to >1.34m/s as to improve gait speed for better community ambulation and to reduce fall risk. Baseline: 20.33 sec with walker 1/8:17.72 sec Goal status: INITIAL  6.  Patient will increase 2 minute walk test distance by 50 ft or greater for progression to community ambulator and demonstrate improved gait ability  Baseline: 275'; 83.82 m 1/8:221 ft no supplemental oxygen  Goal status: INITIAL     ASSESSMENT:  CLINICAL IMPRESSION:  The patient is a 79 year old female s/p CVA who continues to demonstrate deficits in balance, strength, endurance, and functional mobility. During neuromuscular reeducation activities on uneven surfaces, she exhibited notable fatigue and increased work of breathing, though oxygen  saturation remained above 92% throughout. Therapeutic strengthening activities, including LAQ with ankle weights and progressive resistance on the NuStep, were completed with frequent cueing for technique and minimal assistance. Task oriented training such as step overs, step ups, and standing hip abduction required UE support and occasional rest breaks due to fatigue and shortness of breath. Continued physical therapy is beneficial to address ongoing impairments in lower extremity strength, balance, and movement coordination, as well as decreased activity tolerance. Skilled intervention remains necessary to safely progress functional mobility tasks, reduce fall risk, and improve cardiovascular endurance while monitoring respiratory response. Ongoing therapy will support the patients ability to perform daily activities more safely and efficiently,  promoting greater independence and quality of life.    OBJECTIVE IMPAIRMENTS: Abnormal gait, decreased activity tolerance, decreased balance, decreased endurance, decreased knowledge of use of DME, decreased mobility, difficulty walking, decreased strength, impaired sensation, and pain.   ACTIVITY LIMITATIONS: carrying, lifting, bending, standing, squatting, stairs, transfers, bathing, toileting, dressing, hygiene/grooming, and locomotion level  PARTICIPATION LIMITATIONS: meal prep, cleaning, laundry, community activity, and yard work  PERSONAL FACTORS: Age and 3+ comorbidities:  PMH: of CVA, COPD, Dysarthria, Ataxia, TIA, T2DM, Back pain, scoliosis are also affecting patient's functional outcome.   REHAB POTENTIAL: Good  CLINICAL DECISION MAKING: Evolving/moderate complexity  EVALUATION COMPLEXITY: Moderate  PLAN:  PT FREQUENCY: 2x/week  PT DURATION: 12 weeks  PLANNED INTERVENTIONS: 97750- Physical Performance Testing, 97110-Therapeutic exercises, 97530- Therapeutic activity, [id number]- Neuromuscular re-education, 97535- Self Care, [id number]- Manual therapy, [phone]- Gait training, Balance training, and Vestibular training  PLAN FOR NEXT SESSION:   Monitor SPO2 and get portable oxygen  if  necessary  Endurance, balance, strength ( L>R), updated HEP, gait training  -Limiting seated rest breaks as tolerated to challenge endurance  Note: Portions of this document were prepared using Dragon voice recognition software and although reviewed may contain unintentional dictation errors in syntax, grammar, or spelling.  03/21/24, 11:17 AM  [name] PT ,DPT Physical Therapist- [place]  [facility]     "

## 2024-03-21 NOTE — Therapy (Signed)
 " Occupational Therapy Treatment Note  Patient Name: Hannah Kirby MRN: [id number] DOB:1945-03-31, 79 y.o., female Today's Date: 03/21/2024  PCP: Dr. [name] Perry REFERRING PROVIDER: Dr. [name] Perry  END OF SESSION:  OT End of Session - 03/21/24 1140     Visit Number 21    Number of Visits 24    Date for Recertification  04/18/24    OT Start Time 1020    OT Stop Time 1100    OT Time Calculation (min) 40 min    Activity Tolerance Patient tolerated treatment well    Behavior During Therapy [facility] for tasks assessed/performed         Past Medical History:  Diagnosis Date   Acute hemorrhagic colitis 08/08/2019   Anemia    C. difficile colitis 08/09/2019   CAD (coronary artery disease)    a. 02/2006 PCI: BMS x 2 to RCA, cath o/w without significant coronary disease; b. nuclear stress test 07/2014: No ischemia/infarct; c. 11/2017 MV: no isch/infarct, EF 55-65%; d. 03/2020 NSTEMI/PCI: LM nl, LAD min irregs, RI 25, small, LCX nl, RCA 30p/m ISR, 99d (3.0x15 Resolute Onyx DES).   Cataract 02/04/2018   Cerebrovascular accident (CVA) (HCC) 03/18/2023   dysarthria   Chronic bronchitis (HCC)    secondary to cigarette smoking   FHx: allergies    Goiter    Granulomatous disease    Hernia    Kidney stone on left side 2013   NSTEMI (non-ST elevated myocardial infarction) (HCC) 03/23/2020   Oxygen  deficiency    Panic attacks    PVC's (premature ventricular contractions)    a. 03/2018 Zio: Occas PVCs (2.5%). Triggered events assoc w/ PVC/PAC.   Retinal detachment, left 03/04/2020   Retinal tear 2020   Stroke (HCC) 10/29/2016   mild left side weakness   Stroke (HCC) 08/01/2019   Tobacco abuse    Past Surgical History:  Procedure Laterality Date   ABDOMINAL HYSTERECTOMY     BREAST SURGERY     CATARACT EXTRACTION W/PHACO Right 03/01/2017   Procedure: CATARACT EXTRACTION PHACO AND INTRAOCULAR LENS PLACEMENT (IOC) RIGHT DIABETIC;  Surgeon: Mittie Gaskin, MD;  Location: [facility]ERY [facility];  Service: Ophthalmology;  Laterality: Right;   CATARACT EXTRACTION W/PHACO Left 03/22/2017   Procedure: CATARACT EXTRACTION PHACO AND INTRAOCULAR LENS PLACEMENT (IOC) LEFT DIABETIC;  Surgeon: Mittie Gaskin, MD;  Location: [facility] SUR[facility]ERY [facility];  Service: Ophthalmology;  Laterality: Left;  Diabetic - insulin  and oral meds   COLONOSCOPY WITH PROPOFOL  N/A 09/23/2014   Procedure: COLONOSCOPY WITH PROPOFOL ;  Surgeon: Gladis RAYMOND Mariner, MD;  Location: [facility] ENDOSCOPY;  Service: Endoscopy;  Laterality: N/A;   COLONOSCOPY WITH PROPOFOL  N/A 01/11/2018   Procedure: COLONOSCOPY WITH PROPOFOL ;  Surgeon: Mariner Gladis RAYMOND, MD;  Location: [facility] ENDOSCOPY;  Service: Endoscopy;  Laterality: N/A;   COLONOSCOPY WITH PROPOFOL  N/A 04/24/2018   Procedure: COLONOSCOPY WITH PROPOFOL ;  Surgeon: Mariner Gladis RAYMOND, MD;  Location: [facility] ENDOSCOPY;  Service: Endoscopy;  Laterality: N/A;   COLONOSCOPY WITH PROPOFOL  N/A 11/19/2019   Procedure: COLONOSCOPY WITH PROPOFOL ;  Surgeon: Jinny Carmine, MD;  Location: [facility] ENDOSCOPY;  Service: Endoscopy;  Laterality: N/A;   CORONARY ANGIOPLASTY WITH STENT PLACEMENT  2008   CORONARY STENT INTERVENTION N/A 03/24/2020   Procedure: CORONARY STENT INTERVENTION;  Surgeon: Mady Bruckner, MD;  Location: [facility] INVASIVE CV LAB;  Service: Cardiovascular;  Laterality: N/A;   ESOPHAGOGASTRODUODENOSCOPY (EGD) WITH PROPOFOL  N/A 12/30/2014   Procedure: ESOPHAGOGASTRODUODENOSCOPY (EGD) WITH PROPOFOL ;  Surgeon: Gladis RAYMOND Mariner, MD;  Location: [facility] ENDOSCOPY;  Service: Endoscopy;  Laterality: N/A;   ESOPHAGOGASTRODUODENOSCOPY (EGD) WITH PROPOFOL  N/A 07/19/2016   Procedure: ESOPHAGOGASTRODUODENOSCOPY (EGD) WITH PROPOFOL ;  Surgeon: Gaylyn Gladis PENNER, MD;  Location: [facility] ENDOSCOPY;  Service: Endoscopy;  Laterality: N/A;   ESOPHAGOGASTRODUODENOSCOPY (EGD) WITH PROPOFOL  N/A 04/24/2018   Procedure: ESOPHAGOGASTRODUODENOSCOPY (EGD) WITH PROPOFOL ;  Surgeon: Gaylyn Gladis PENNER, MD;   Location: [facility] ENDOSCOPY;  Service: Endoscopy;  Laterality: N/A;   EYE SURGERY  cataracts, detached retina   hysterectomy (other)     LEFT HEART CATH AND CORONARY ANGIOGRAPHY N/A 03/24/2020   Procedure: LEFT HEART CATH AND CORONARY ANGIOGRAPHY;  Surgeon: Mady Bruckner, MD;  Location: [facility] INVASIVE CV LAB;  Service: Cardiovascular;  Laterality: N/A;   Patient Active Problem List   Diagnosis Date Noted   Stroke (HCC) 08/05/2023   Depression with anxiety 08/05/2023   Overweight (BMI 25.0-29.9) 08/05/2023   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) 03/17/2023   Dizziness 02/23/2023   Restrictive airway disease 07/21/2020   Partial thickness rotator cuff tear 03/20/2020   Shoulder pain 03/05/2020   Ataxia 02/28/2020   Difficulty walking 02/28/2020   Physical deconditioning 02/28/2020   Chronic diastolic congestive heart failure (HCC) 11/20/2019   Polyp of ascending colon    Hypokalemia 08/08/2019   TIA (transient ischemic attack) 08/01/2019   Benign hypertensive kidney disease with chronic kidney disease 04/25/2019   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/24/2018   Chronic, continuous use of opioids 03/26/2018   History of adenomatous polyp of colon 01/22/2018   Diverticulosis of colon 01/22/2018   History of CVA (cerebrovascular accident) 11/08/2016   Weakness of left upper extremity 10/30/2016   AAA (abdominal aortic aneurysm) without rupture 08/12/2016   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   Stage 3b chronic kidney disease (HCC) 07/27/2015   Type 2 diabetes mellitus with diabetic chronic kidney disease (HCC) 10/28/2014   Solitary pulmonary nodule on lung CT 08/20/2014   Allergic rhinitis 07/28/2014   Acid reflux 07/28/2014   Chronic obstructive pulmonary disease (HCC) 07/28/2014   Granuloma annulare    Back pain 11/12/2013   Lumbar scoliosis 10/30/2013   Lumbar radiculopathy 10/30/2013   Lumbar canal stenosis 10/30/2013   Proteinuria  10/22/2013   Smokes with greater than 30 pack year history 03/21/2011   Edema 08/18/2010   Hyperlipidemia 03/25/2009   Coronary artery disease 03/25/2009   Primary hypertension 03/25/2009   ONSET DATE: 08/05/2023  REFERRING DIAG: M70.101 (ICD-10-CM) - Weakness of left upper extremity   THERAPY DIAG:  Muscle weakness (generalized)  Rationale for Evaluation and Treatment: Rehabilitation  SUBJECTIVE:  SUBJECTIVE STATEMENT: Pt. Arrived with her personal rollator Pt accompanied by: family member: daughter, Harrie  PERTINENT HISTORY: Daughter present and provided hx.  Daughter reports pt has had 4 strokes, beginning in 2018, 2019, January of 2025 and the most recent in June of 2025.  Pt had been participating in [facility] since the most recent CVA, and is now eager to transition to outpatient services where PT/OT/SLP have been ordered. Per MEDICAL RECORD NUMBERPMH: of CVA, COPD, Dysarthria, Ataxia, TIA, T2DM, Back pain, scoliosis   PRECAUTIONS: Fall, Loop recorder for Afib  WEIGHT BEARING RESTRICTIONS: No  PAIN:   03/21/24: No pain 03/12/24: No pain 03/05/24: No pain 02/29/24: No reports of pain. 02/27/24: 4/10 back pain Are you having pain? Yes: NPRS scale: 3/10, can get up to 7-8/10 Pain location: low back and L hip Pain description: achy, sharp, dull Aggravating factors: prolonged standing  Relieving factors: sitting, pain meds, heat/ice   FALLS: Has patient fallen in last  6 months? Yes. Number of falls 1, tripping over cords  LIVING ENVIRONMENT: Lives with: lives with their spouse Lives in: 1 level home Stairs: Yes: External: 1 steps; none Has following equipment at home: walk in shower, comfort height toilet   PLOF: Prior to Jan of this year, pt was managing all home management tasks, was able to drive, and was caring for spouse   PATIENT GOALS: Strengthening the left arm   OBJECTIVE:  Note: Objective measures were completed at Evaluation unless otherwise noted.  HAND DOMINANCE:  Right  ADLs: Overall ADLs: Daughter is Pt's primary caregiver and lives near by Transfers/ambulation related to ADLs: occasional help with using walker at home, depending on balance on a given day Eating: indep/able to cut food  Grooming: bimanual grooming without difficulty UB Dressing: extra time for clothing fasteners, but able to gather clothing with RW LB Dressing: extra time with clothing fasteners Toileting: occasional help to walk to the bathroom, but modified indep within the bathroom for toileting tasks Bathing: direct supv for bathing using walkin in shower, shower chair, and grab bar; gets hair washed weekly at salon Tub Shower transfers: supv for walk in shower Equipment: RW, SBQC, shower chair, grab bars  IADLs: Shopping: daughter currently managing Light housekeeping: daughter currently managing; can start laundry on a good day.  Meal Prep: starting to participate in light meal prep, including making eggs on stove top Community mobility: daughter assists with all transportation; pt using transport chair to reach therapy gym today Medication management: daughter sets up pills 2 weeks at a time  Financial management: pt is participating in bill paying with daughter's assistance; most are online Handwriting: 100% legible  POSTURE COMMENTS:  rounded shoulders, forward head, increased thoracic kyphosis  ACTIVITY TOLERANCE: Activity tolerance: to be further assessed within functional contexts  FUNCTIONAL OUTCOME MEASURES: TBD  UPPER EXTREMITY ROM:  BUEs WFL for daily tasks  UPPER EXTREMITY MMT:  Hx of R rotator cuff tear 4-5 years ago with residual weakness, non-surgical;    MMT Right eval Right 01/03/24 Right 03/12/24 Left eval Left 01/03/24 Left 03/12/24  Shoulder flexion 3+ 3+ 3+ 4 4 4   Shoulder abduction 3+ 3+ 3+ 4 4 4   Shoulder adduction        Shoulder extension        Shoulder internal rotation 3+ 3+ 3+ 4- 4 4  Shoulder external rotation 3+ 3+ 3+ 3+ 4-  4-  Elbow flexion 5 5 5 5 5 5   Elbow extension 5 5 5 5 5 5   Wrist flexion 4+ 5 5 5 5 5   Wrist extension 4+ 5 5 5 5 5   Wrist ulnar deviation        Wrist radial deviation        Wrist pronation        Wrist supination        (Blank rows = not tested)   *Most recent CVA affecting L non-dominant side, with LUE presenting with increased strength as compared to dominant/unaffected arm.  Assume RUE weaker from hx of rotator cuff tear, or possibly previous CVAs.  Pt does present with mild LUE ataxia, and bilat hand weakness.  HAND FUNCTION: Grip strength: Right: 32 lbs; Left: 30 lbs, Lateral pinch: Right: 8 lbs, Left: 6 lbs, and 3 point pinch: Right: 7 lbs, Left: 6 lbs Grip strength: Right: 40 lbs; Left: 30 lbs, Lateral pinch: Right: 16 lbs, Left: 12; 3 point pinch: Right: 18 lbs, Left: 13 lbs  01/25/24: Right: 35 lbs;  Left: 30 lbs, Lateral pinch: Right 7 lbs, Left: 5 lbs, and 3 point pinch: Right: 7 lbs, Left: 4 lbs  03/12/24: Right: 35 lbs; Left: 30 lbs, Lateral pinch: Right 8 lbs, Left: 6 lbs, and 3 point pinch: Right: 7 lbs, Left: 4 lbs       COORDINATION: Finger Nose Finger test: LUE mild ataxia  9 Hole Peg test: Right: 29 sec; Left: 47 sec 01/03/24: Right: 27 sec, Left: 37 sec  01/25/24: Right 27 sec, Left: 35 sec  03/12/24:01/25/24: Right 26 sec, Left: 36 sec    SENSATION: Pt reports intermittent numbness in the R thumb, IF, and LF  EDEMA: No visible edema  MUSCLE TONE: RUE: Within functional limits and LUE: Within functional limits  COGNITION: Overall cognitive status: Impaired; see SLP eval for details  VISION: Pt reports L eye feels more blurry since first stroke in 2018; pt has reading glasses but doesn't usually wear them   VISION ASSESSMENT: To be further assessed in functional context Tracking/Visual pursuits: Able to track stimulus in all quads without difficulty Saccades: WFL Visual Fields: no apparent deficits Clock drawing completed: all numbers spaced evenly, but  pt left out the 1 and verbalized not knowing how to draw ten minutes to eleven when this direction was given.  Patient has difficulty with following activities due to following visual impairments: To be further assessed  PERCEPTION: To be further assessed  PRAXIS: Impaired: Motor planning  OBSERVATIONS: Pt. pleasant, cooperative.  Daughter present and very supportive in pt's care.                                                                                                                       TREATMENT DATE: 03/21/24:   Therapeutic Ex.:   -Performed BUE strengthening using a 2# dowel ex. 2/2 to weakness. Bilateral shoulder flexion, chest press, circular patterns, and elbow flexion/extension for 1 set 10 reps each.   Therapeutic Activities:   -Facilitated standing activity tolerance tasks which were performed in combination with reaching through multiple planes to place items on designated targets placed at multiple vertical, and diagonal angles.   Self-care:  - Assessed rollator use in the kitchen standing, turning the walker to increase access, and transporting items using the rollator, and countertop surfaces.   PATIENT EDUCATION: Education details: EC/laundry strategies Person educated: Patient Education method: Programmer, Multimedia, Demonstration, and Verbal cues Education comprehension: verbalized understanding, returned demonstration, verbal cues required, and needs further education  HOME EXERCISE PROGRAM: Yellow theraputty  GOALS: Goals reviewed with patient? Yes  SHORT TERM GOALS: Target date: 03/07/24  Pt will perform HEP with min vc or less for improving LUE coordination and bilat hand strength. Baseline: Eval: HEP not yet initiated; 01/03/24: Pt acknowledges minimal participation in HEP in the home, but does have HEP in place with putty and coordination training activities; 01/25/24: see 01/03/24 Goal status: d/c/ not achieved  2.  Pt will be indep to verbalize and  implement 2-3 fall prevention measures to reduce fall  risk with ADLs. Baseline: Eval: Educ not yet initiated; 01/03/24: 1 fall last week after reaching to floor level to pick up soiled dog pad while dizzy; pt able to verbalize 3 fall prevention strategies with min vc; 01/25/24: pt uses countertop for support while bending/reaching/ambulating in kitchen, but is inconsistent with walker in the home 03/13/24: Pt. Requires cues for identifying 1 fall prevention measure.  Goal status: in progress  LONG TERM GOALS: Target date: 04/18/24  Pt will perform shower transfers with modified indep Baseline: Eval: Direct supv; 01/03/24: direct supv from spouse, which pt plans to continue Goal status: d/c  2.  Pt will improve LUE GMC to enable reaching with good accuracy into overhead kitchen cabinets. Baseline: Eval: Mild LUE ataxia; 01/03/24: Residual mild LUE ataxia and pt denies attempts at reaching into kitchen cupboards d/t family managing meals; 01/25/24: Demonstrated today with good accuracy  Goal status: achieved  3.  Pt will improve L hand [facility] skills as demonstrated by 10 sec or more improvement in 9 hole peg test for improved efficiency when manipulating  clothing fasteners. Baseline: Eval: L 9 hole peg test: L 47 sec, R 29 sec; 01/03/24: L 37 sec, R 27 sec, indep with clothing fasteners Goal status: achieved  4.  Pt will manage light loads of laundry with modified indep. Baseline: Eval: Daughter mostly manages, but pt can start laundry on a good day; 01/03/24: Pt can start laundry, and has been given recommendations for safe reaching strategies and item transport for increasing indep with this task, but has not yet attempted these strategies at home. Pt continues to rely on family to manage laundry tasks; 01/25/24: Same as 01/03/24 as daughter reports she has not yet implemented recommendations for de-cluttering laundry room to allow space for a chair 03/12/24: Pt. Is able to manage washing 1-2 small  items independently in the washer and dryer with RPE 5 (challenging activity). Goal status: in progress  5. Pt will perform hot or cold light meal prep with distant supv and RW or rollator for at least 1 meal per day with reported RPE of 6 or less on modified Borg scale. Baseline: 01/03/24: Family currently manages meal preparation; 01/25/24: Pt has started to use microwave and occasionally stove top to reheat food, but verbalizes taxing effort; RPE 7/10 (vigorous activity) on modified Borg scale. 03/13/24: Pt. Continues to use the microwave the microwave, and the stovetop however continues to fatigue.   Goal status Ongoing     6.  Pt will verbalize and implement 1-2 EC strategies with min vc or less to enable light sweeping and mopping of kitchen floor with reported RPE of 6 or less on modified Borg scale.      Baseline: 01/25/24: RPE 7-8.  Often relies on daughter. 03/12/24: Sweeping/mopping RPE 6 (Hard Activity)    Goal status: Ongoing      7. Pt. Will brush her teeth while standing at the sinkside with an RPE level 3(Easy Activity)    Baseline: RPE6 ( Hard Activity)    Goal Status: New    8. Pt. Will wash dishes while standing at the sinkside with an RPE level 3 (Easy Activity)    Baseline: RPE 5 (Challenging Activity)    Goal Status: New   ASSESSMENT: CLINICAL IMPRESSION:  Pt. tolerated the BUE strengthening exercises with cues required. Pt. presented with limited activity tolerance requiring multiple seated rest breaks during the standing, and reaching tasks. Pt. will require cues, and assist for reinforcement of work walker use,  and work simplification strategies within the kitchen.  Pt. continues to work on improving activity tolerance in standing for homemaking tasks, and meal preparation tasks with new goals added to work on improving activity tolerance in standing for oral care tasks, and washing dishes.  PERFORMANCE DEFICITS: in functional skills including ADLs, IADLs,  coordination, dexterity, sensation, ROM, strength, pain, Fine motor control, Gross motor control, mobility, balance, body mechanics, endurance, decreased knowledge of precautions, decreased knowledge of use of DME, vision, and UE functional use, cognitive skills including emotional, perception, problem solving, safety awareness, temperament/personality, and understand, and psychosocial skills including coping strategies, environmental adaptation, and routines and behaviors.   IMPAIRMENTS: are limiting patient from ADLs, IADLs, leisure, and social participation.   CO-MORBIDITIES: has co-morbidities such as afib, AAA, hx of multiple CVAs that affects occupational performance. Patient will benefit from skilled OT to address above impairments and improve overall function.  MODIFICATION OR ASSISTANCE TO COMPLETE EVALUATION: Min-Moderate modification of tasks or assist with assess necessary to complete an evaluation.  OT OCCUPATIONAL PROFILE AND HISTORY: Detailed assessment: Review of records and additional review of physical, cognitive, psychosocial history related to current functional performance.  CLINICAL DECISION MAKING: Moderate - several treatment options, min-mod task modification necessary  REHAB POTENTIAL: Good  EVALUATION COMPLEXITY: Moderate    PLAN:  OT FREQUENCY: 1x/week  OT DURATION: 12 weeks  PLANNED INTERVENTIONS: 97168 OT Re-evaluation, 97535 self care/ADL training, 02889 therapeutic exercise, 97530 therapeutic activity, 97112 neuromuscular re-education, 97140 manual therapy, 97116 gait training, 02989 moist heat, 97010 cryotherapy, 97129 Cognitive training (first 15 min), 02249 Physical Performance Testing, balance training, functional mobility training, visual/perceptual remediation/compensation, psychosocial skills training, energy conservation, coping strategies training, patient/family education, and DME and/or AE instructions  RECOMMENDED OTHER SERVICES: none at this time  (OT/PT/SLP services have all been ordered)  CONSULTED AND AGREED WITH PLAN OF CARE: Patient and family member/caregiver  PLAN FOR NEXT SESSION: see above  Richardson Otter, MS, OTR/L   03/21/2024, 11:42 AM     "

## 2024-03-21 NOTE — Therapy (Signed)
 " OUTPATIENT SPEECH LANGUAGE PATHOLOGY APHASIA TREATMENT    Patient Name: Hannah Kirby MRN: [id number] DOB:04-30-1945, 79 y.o., female Today's Date: 03/21/2024  PCP: [name] Perry, MD REFERRING PROVIDER: [name] Perry, MD    End of Session - 03/21/24 1030     Visit Number 23    Number of Visits 37    Date for Recertification  04/09/24    Progress Note Due on Visit 30    SLP Start Time 0930    SLP Stop Time  1015    SLP Time Calculation (min) 45 min    Activity Tolerance Patient tolerated treatment well          Past Medical History:  Diagnosis Date   Acute hemorrhagic colitis 08/08/2019   Anemia    C. difficile colitis 08/09/2019   CAD (coronary artery disease)    a. 02/2006 PCI: BMS x 2 to RCA, cath o/w without significant coronary disease; b. nuclear stress test 07/2014: No ischemia/infarct; c. 11/2017 MV: no isch/infarct, EF 55-65%; d. 03/2020 NSTEMI/PCI: LM nl, LAD min irregs, RI 25, small, LCX nl, RCA 30p/m ISR, 99d (3.0x15 Resolute Onyx DES).   Cataract 02/04/2018   Cerebrovascular accident (CVA) (HCC) 03/18/2023   dysarthria   Chronic bronchitis (HCC)    secondary to cigarette smoking   FHx: allergies    Goiter    Granulomatous disease    Hernia    Kidney stone on left side 2013   NSTEMI (non-ST elevated myocardial infarction) (HCC) 03/23/2020   Oxygen  deficiency    Panic attacks    PVC's (premature ventricular contractions)    a. 03/2018 Zio: Occas PVCs (2.5%). Triggered events assoc w/ PVC/PAC.   Retinal detachment, left 03/04/2020   Retinal tear 2020   Stroke (HCC) 10/29/2016   mild left side weakness   Stroke (HCC) 08/01/2019   Tobacco abuse    Past Surgical History:  Procedure Laterality Date   ABDOMINAL HYSTERECTOMY     BREAST SURGERY     CATARACT EXTRACTION W/PHACO Right 03/01/2017   Procedure: CATARACT EXTRACTION PHACO AND INTRAOCULAR LENS PLACEMENT (IOC) RIGHT DIABETIC;  Surgeon: Mittie Gaskin, MD;  Location: [facility];   Service: Ophthalmology;  Laterality: Right;   CATARACT EXTRACTION W/PHACO Left 03/22/2017   Procedure: CATARACT EXTRACTION PHACO AND INTRAOCULAR LENS PLACEMENT (IOC) LEFT DIABETIC;  Surgeon: Mittie Gaskin, MD;  Location: [facility];  Service: Ophthalmology;  Laterality: Left;  Diabetic - insulin  and oral meds   COLONOSCOPY WITH PROPOFOL  N/A 09/23/2014   Procedure: COLONOSCOPY WITH PROPOFOL ;  Surgeon: Gladis RAYMOND Mariner, MD;  Location: [facility] ENDOSCOPY;  Service: Endoscopy;  Laterality: N/A;   COLONOSCOPY WITH PROPOFOL  N/A 01/11/2018   Procedure: COLONOSCOPY WITH PROPOFOL ;  Surgeon: Mariner Gladis RAYMOND, MD;  Location: [facility] ENDOSCOPY;  Service: Endoscopy;  Laterality: N/A;   COLONOSCOPY WITH PROPOFOL  N/A 04/24/2018   Procedure: COLONOSCOPY WITH PROPOFOL ;  Surgeon: Mariner Gladis RAYMOND, MD;  Location: [facility] ENDOSCOPY;  Service: Endoscopy;  Laterality: N/A;   COLONOSCOPY WITH PROPOFOL  N/A 11/19/2019   Procedure: COLONOSCOPY WITH PROPOFOL ;  Surgeon: Jinny Carmine, MD;  Location: [facility] ENDOSCOPY;  Service: Endoscopy;  Laterality: N/A;   CORONARY ANGIOPLASTY WITH STENT PLACEMENT  2008   CORONARY STENT INTERVENTION N/A 03/24/2020   Procedure: CORONARY STENT INTERVENTION;  Surgeon: Mady Bruckner, MD;  Location: [facility] INVASIVE CV LAB;  Service: Cardiovascular;  Laterality: N/A;   ESOPHAGOGASTRODUODENOSCOPY (EGD) WITH PROPOFOL  N/A 12/30/2014   Procedure: ESOPHAGOGASTRODUODENOSCOPY (EGD) WITH PROPOFOL ;  Surgeon: Gladis RAYMOND Mariner, MD;  Location: [facility] ENDOSCOPY;  Service: Endoscopy;  Laterality: N/A;   ESOPHAGOGASTRODUODENOSCOPY (EGD) WITH PROPOFOL  N/A 07/19/2016   Procedure: ESOPHAGOGASTRODUODENOSCOPY (EGD) WITH PROPOFOL ;  Surgeon: Gaylyn Gladis PENNER, MD;  Location: [facility] ENDOSCOPY;  Service: Endoscopy;  Laterality: N/A;   ESOPHAGOGASTRODUODENOSCOPY (EGD) WITH PROPOFOL  N/A 04/24/2018   Procedure: ESOPHAGOGASTRODUODENOSCOPY (EGD) WITH PROPOFOL ;  Surgeon: Gaylyn Gladis PENNER, MD;  Location: [facility]  ENDOSCOPY;  Service: Endoscopy;  Laterality: N/A;   EYE SURGERY  cataracts, detached retina   hysterectomy (other)     LEFT HEART CATH AND CORONARY ANGIOGRAPHY N/A 03/24/2020   Procedure: LEFT HEART CATH AND CORONARY ANGIOGRAPHY;  Surgeon: Mady Bruckner, MD;  Location: [facility] INVASIVE CV LAB;  Service: Cardiovascular;  Laterality: N/A;   Patient Active Problem List   Diagnosis Date Noted   Stroke (HCC) 08/05/2023   Depression with anxiety 08/05/2023   Overweight (BMI 25.0-29.9) 08/05/2023   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) 03/17/2023   Dizziness 02/23/2023   Restrictive airway disease 07/21/2020   Partial thickness rotator cuff tear 03/20/2020   Shoulder pain 03/05/2020   Ataxia 02/28/2020   Difficulty walking 02/28/2020   Physical deconditioning 02/28/2020   Chronic diastolic congestive heart failure (HCC) 11/20/2019   Polyp of ascending colon    Hypokalemia 08/08/2019   TIA (transient ischemic attack) 08/01/2019   Benign hypertensive kidney disease with chronic kidney disease 04/25/2019   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/24/2018   Chronic, continuous use of opioids 03/26/2018   History of adenomatous polyp of colon 01/22/2018   Diverticulosis of colon 01/22/2018   History of CVA (cerebrovascular accident) 11/08/2016   Weakness of left upper extremity 10/30/2016   AAA (abdominal aortic aneurysm) without rupture 08/12/2016   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   Stage 3b chronic kidney disease (HCC) 07/27/2015   Type 2 diabetes mellitus with diabetic chronic kidney disease (HCC) 10/28/2014   Solitary pulmonary nodule on lung CT 08/20/2014   Allergic rhinitis 07/28/2014   Acid reflux 07/28/2014   Chronic obstructive pulmonary disease (HCC) 07/28/2014   Granuloma annulare    Back pain 11/12/2013   Lumbar scoliosis 10/30/2013   Lumbar radiculopathy 10/30/2013   Lumbar canal stenosis 10/30/2013   Proteinuria 10/22/2013   Smokes  with greater than 30 pack year history 03/21/2011   Edema 08/18/2010   Hyperlipidemia 03/25/2009   Coronary artery disease 03/25/2009   Primary hypertension 03/25/2009    ONSET DATE: 03/17/23 (admitted with stroke), 05/23/23 (referral date)  REFERRING DIAG:  R41.841 (ICD-10-CM) - Cognitive communication deficit  I63.9 (ICD-10-CM) - CVA (cerebral vascular accident) (HCC)  R47.02 (ICD-10-CM) - Dysphasia    THERAPY DIAG:  Aphasia  Apraxia of speech  Rationale for Evaluation and Treatment Rehabilitation  SUBJECTIVE:   SUBJECTIVE STATEMENT: Pt alert, pleasant, and cooperative.  Pt accompanied by: self and family member  PERTINENT HISTORY:  Pt is a 79 y.o. female who presents for communication evaluation in setting on stroke. Pt in ED 1/24-1/25/25. Pt d/c'd home with HH ST. PMHx significant of   HTN, CKD stage IIIB, DMT2 on insulin , CAD, chronic smoking, chronic respiratory failure with COPD and on home oxygen , anxiety, HLD. MRI 03/17/23 1. Punctate foci of restricted diffusion in the left posterior  frontal white matter, most likely acute infarcts.  2. Numerous foci of hemosiderin deposition, which are seen in the  deep gray structures but also in the bilateral cerebral hemispheres,  with 1 new focus compared to 02/20/2023. While this could be the  sequela of chronic hypertensive microhemorrhages, this appearance is  concerning for  cerebral amyloid angiopathy.   PAIN:  Are you having pain? No  FALLS: defer to PT  LIVING ENVIRONMENT: Lives with: lives with their spouse Lives in: House/apartment  PLOF:  Level of assistance: Independent with ADLs Employment: Retired   PATIENT GOALS    for communication to improve   OBJECTIVE:  TODAY'S TREATMENT: Pt seen for skilled ST targeting functional verbal expression. Pt continues with non-fluent aphasia. SLP introduced VNeST approach to therapy including it's rationale. Pt able to generate x8 sentences with min A utilizing  this approach. Noted pt more talkative and with brighter affect this date.      PATIENT EDUCATION: Education details: as above Person educated: Patient and Child Education method: Explanation Education comprehension: verbalized understanding and needs further education  HOME EXERCISE PROGRAM:    Practice scripts  SFA homework  To do list with x1 cognitive task per day    GOALS:  Goals reviewed with patient? Yes  SHORT TERM GOALS: Target date: 10 sessions  Pt will participate in further assessment of functional reading/writing. Baseline: Goal status: MET  2.  With Moderate A, patient will complete a semantic feature analysis with at least 2 relevant features for 8/10 target words to improve word-finding skills.  Baseline:  Goal status: PROGRESSING; continue goal on 01/11/24  3.  With Moderate A, patient will generate sentences with 3 or more words in response to a situation at 80% accuracy in order to increase ability to communicate basic wants and needs.  Baseline:  Goal status: MET at max cues; changed to mod cues on 02/27/24  4.   With Maximal A,  pt will follow 2-step commands for improved participation in ADLs/iADLs with max cueing. Baseline:  Goal status: MET  5.  Pt will repeat short sentences (<4 words) with good approximations 80% of the time with max cueing. Baseline:  Goal status: MET  New goal, 02/27/24 6. Pt will complete a cognitively based activity >3x week to promote cognitive-communication and life participation.   Baseline:  Goal status: INITIAL  7. Pt will utilize scripts with no more than min cues to improve communication success. Baseline: Goal status: INITIAL      LONG TERM GOALS: Target date: 4 weeks; 02/08/24; goal continued until 03/30/24  Pt will report a subjective improvement in communication per PROM.  Baseline: CES 12/32 on 8/26 Goal status: INITIAL; continue goal  2.  With Min A, patient/family will demonstrate understanding of the  following concepts: aphasia, spontaneous recovery, communication vs conversation, strengths/strategies to promote success, local resources in order to increase patient's participation in medical care.    Baseline:  Goal status: MET  3. Pt and/or daughter will demonstrate understanding of multiple ways to stimulate pt cognitively outside of ST.   Baseline:  Goal status: CONTINUE; progressing   ASSESSMENT:  CLINICAL IMPRESSION: Pt is a 79 y.o. female who presents for communication treatment in setting on stroke. Pt known to SLP services and was receiving OP ST prior to most recent stroke 08/05/23. Pt d/c'd home with [facility] ST who is working on securing SGD for pt. PMHx significant of  HTN, CKD stage IIIB, DMT2 on insulin , CAD, chronic smoking, chronic respiratory failure with COPD and on home oxygen , anxiety, HLD. Re-assessment completed 01/10/34 via formal assessment Western Aphasia Battery Revised (WAB-R). 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 Noted improvement in pt's auditory comprehension and repetition; however, difficulty with spontaneous speech and naming/wordfinding  persist. Course of therapy, thus far this episode, has be limited by pt's illness with patient missing several scheduled sessions and pt's limited support/cognitive stimulation at home. Recommend continued course of skilled ST on a trial basis for the next 6 weeks with pt in agreement to complete home assignments and attend therapy sessions regularly (>50% of the time). See details of tx session above. Recommend course of ST targeting functional communication, further assessment of functional reading/writing, and pt/caregiver training to help promote QoL and overall life participation.   OBJECTIVE IMPAIRMENTS include expressive language, receptive language, and aphasia. These impairments are limiting patient from ADLs/IADLs  and effectively communicating at home and in community. Factors affecting potential to achieve goals and functional outcome are severity of impairments. Patient will benefit from skilled SLP services to address above impairments and improve overall function.  REHAB POTENTIAL: Good  PLAN: SLP FREQUENCY: 1-2x/week  SLP DURATION: 6 weeks  PLANNED INTERVENTIONS: Language facilitation, Cueing hierachy, Internal/external aids, Functional tasks, Multimodal communication approach, SLP instruction and feedback, Compensatory strategies, and Patient/family education    Delon Bangs, M.S., CCC-SLP Speech-Language Pathologist [place] [facility] [phone] [place])  [facility] at [facility] [address] [place], [place], [address] Phone: [phone]   Fax:  [phone] "

## 2024-03-23 ENCOUNTER — Ambulatory Visit

## 2024-03-25 ENCOUNTER — Ambulatory Visit

## 2024-03-26 ENCOUNTER — Ambulatory Visit

## 2024-03-26 ENCOUNTER — Ambulatory Visit: Admitting: Physical Therapy

## 2024-03-26 ENCOUNTER — Ambulatory Visit: Admitting: Occupational Therapy

## 2024-03-26 ENCOUNTER — Telehealth: Payer: Self-pay | Admitting: Student

## 2024-03-26 DIAGNOSIS — E7849 Other hyperlipidemia: Secondary | ICD-10-CM

## 2024-03-26 LAB — CUP PACEART REMOTE DEVICE CHECK
Date Time Interrogation Session: 20260201235241
Implantable Pulse Generator Implant Date: 20250626

## 2024-03-27 ENCOUNTER — Ambulatory Visit: Attending: Family Medicine | Admitting: Physical Therapy

## 2024-03-27 ENCOUNTER — Ambulatory Visit

## 2024-03-27 ENCOUNTER — Ambulatory Visit: Admitting: Family Medicine

## 2024-03-27 MED ORDER — ROSUVASTATIN CALCIUM 40 MG PO TABS: 40.0000 mg | ORAL_TABLET | Freq: Every day | ORAL | 1 refills | Status: AC

## 2024-03-27 NOTE — Telephone Encounter (Signed)
 Refill sent

## 2024-03-28 ENCOUNTER — Ambulatory Visit

## 2024-03-28 ENCOUNTER — Ambulatory Visit: Admitting: Occupational Therapy

## 2024-03-28 ENCOUNTER — Ambulatory Visit: Admitting: Physical Therapy

## 2024-03-29 ENCOUNTER — Ambulatory Visit (INDEPENDENT_AMBULATORY_CARE_PROVIDER_SITE_OTHER): Admitting: Vascular Surgery

## 2024-03-29 VITALS — BP 139/81 | HR 71 | Resp 16 | Ht 65.0 in | Wt 158.2 lb

## 2024-03-29 DIAGNOSIS — I10 Essential (primary) hypertension: Secondary | ICD-10-CM

## 2024-03-29 DIAGNOSIS — I5032 Chronic diastolic (congestive) heart failure: Secondary | ICD-10-CM

## 2024-03-29 DIAGNOSIS — I712 Thoracic aortic aneurysm, without rupture, unspecified: Secondary | ICD-10-CM | POA: Insufficient documentation

## 2024-03-29 DIAGNOSIS — I7123 Aneurysm of the descending thoracic aorta, without rupture: Secondary | ICD-10-CM

## 2024-03-29 DIAGNOSIS — R230 Cyanosis: Secondary | ICD-10-CM | POA: Insufficient documentation

## 2024-03-29 DIAGNOSIS — N1832 Chronic kidney disease, stage 3b: Secondary | ICD-10-CM

## 2024-03-29 DIAGNOSIS — E7849 Other hyperlipidemia: Secondary | ICD-10-CM

## 2024-03-29 NOTE — Assessment & Plan Note (Signed)
 Like related more to medical issues.  No limb threatening symptoms.  Check ABIs at next visit.

## 2024-03-29 NOTE — Progress Notes (Signed)
 "   Patient ID: Hannah Kirby, female   DOB: [birth year], 79 y.o.   MRN: [id number]  Chief Complaint  Patient presents with   Follow-up    LS December 2023. fu see fb/jd. Consult. CT done on 11/04/23. Abdominal aortic aneurysm (AAA) without rupture, unspecified part.  fisher, Hannah Kirby.    HPI Hannah Kirby is a 79 y.o. female.  I am asked to see the patient by Dr. Gasper for evaluation of her thoracic aortic aneurysm.    Discussed the use of AI scribe software for clinical note transcription with the patient, who gave verbal consent to proceed.  History of Present Illness Hannah Kirby is a 79 year old female with thoracic aortic aneurysm who presents for vascular surgery follow-up on referral from her PCP.  She was last seen in vascular surgery clinic in 2023. CT from December 2025 showed a 4.7 cm distal descending thoracic aortic aneurysm in the lower thoracic aorta proximal to the renal and mesenteric arteries. She has no chest pain or other aneurysm-related symptoms and remains on serial imaging surveillance.  I have independently reviewed the CT scan.  She has intermittent color changes in both feet. Her daughter noted pallor on arrival that progressed to a purplish discoloration while she was sitting with her feet dependent. She has no leg pain, claudication, muscle fatigue, swelling, or non-healing or slow-healing wounds. She uses mild compression socks for venous symptoms but finds them difficult to put on due to tightness. No lower extremity edema has been observed. She reports generalized fatigue without exertional leg symptoms.    Results Radiology CT thoracic aorta (01/2024): Aneurysm of lower thoracic aorta measuring 4.7 cm   Past Medical History:  Diagnosis Date   Acute hemorrhagic colitis 08/08/2019   Anemia    C. difficile colitis 08/09/2019   CAD (coronary artery disease)    a. 02/2006 PCI: BMS x 2 to RCA, cath o/w without significant coronary disease; b. nuclear  stress test 07/2014: No ischemia/infarct; c. 11/2017 MV: no isch/infarct, EF 55-65%; d. 03/2020 NSTEMI/PCI: LM nl, LAD min irregs, RI 25, small, LCX nl, RCA 30p/m ISR, 99d (3.0x15 Resolute Onyx DES).   Cataract 02/04/2018   Cerebrovascular accident (CVA) (HCC) 03/18/2023   dysarthria   Chronic bronchitis (HCC)    secondary to cigarette smoking   FHx: allergies    Goiter    Granulomatous disease    Hernia    Kidney stone on left side 2013   NSTEMI (non-ST elevated myocardial infarction) (HCC) 03/23/2020   Oxygen  deficiency    Panic attacks    PVC's (premature ventricular contractions)    a. 03/2018 Zio: Occas PVCs (2.5%). Triggered events assoc w/ PVC/PAC.   Retinal detachment, left 03/04/2020   Retinal tear 2020   Stroke (HCC) 10/29/2016   mild left side weakness   Stroke (HCC) 08/01/2019   Tobacco abuse     Past Surgical History:  Procedure Laterality Date   ABDOMINAL HYSTERECTOMY     BREAST SURGERY     CATARACT EXTRACTION W/PHACO Right 03/01/2017   Procedure: CATARACT EXTRACTION PHACO AND INTRAOCULAR LENS PLACEMENT (IOC) RIGHT DIABETIC;  Surgeon: Mittie Gaskin, MD;  Location: [facility];  Service: Ophthalmology;  Laterality: Right;   CATARACT EXTRACTION W/PHACO Left 03/22/2017   Procedure: CATARACT EXTRACTION PHACO AND INTRAOCULAR LENS PLACEMENT (IOC) LEFT DIABETIC;  Surgeon: Mittie Gaskin, MD;  Location: [facility];  Service: Ophthalmology;  Laterality: Left;  Diabetic - insulin  and oral meds   COLONOSCOPY WITH PROPOFOL   N/A 09/23/2014   Procedure: COLONOSCOPY WITH PROPOFOL ;  Surgeon: Gladis RAYMOND Mariner, MD;  Location: [facility] ENDOSCOPY;  Service: Endoscopy;  Laterality: N/A;   COLONOSCOPY WITH PROPOFOL  N/A 01/11/2018   Procedure: COLONOSCOPY WITH PROPOFOL ;  Surgeon: Mariner Gladis RAYMOND, MD;  Location: [facility] ENDOSCOPY;  Service: Endoscopy;  Laterality: N/A;   COLONOSCOPY WITH PROPOFOL  N/A 04/24/2018   Procedure: COLONOSCOPY WITH PROPOFOL ;  Surgeon:  Mariner Gladis RAYMOND, MD;  Location: [facility] ENDOSCOPY;  Service: Endoscopy;  Laterality: N/A;   COLONOSCOPY WITH PROPOFOL  N/A 11/19/2019   Procedure: COLONOSCOPY WITH PROPOFOL ;  Surgeon: Jinny Carmine, MD;  Location: [facility] ENDOSCOPY;  Service: Endoscopy;  Laterality: N/A;   CORONARY ANGIOPLASTY WITH STENT PLACEMENT  2008   CORONARY STENT INTERVENTION N/A 03/24/2020   Procedure: CORONARY STENT INTERVENTION;  Surgeon: Mady Bruckner, MD;  Location: [facility] IN[facility]ASIVE CV LAB;  Service: Cardiovascular;  Laterality: N/A;   ESOPHAGOGASTRODUODENOSCOPY (EGD) WITH PROPOFOL  N/A 12/30/2014   Procedure: ESOPHAGOGASTRODUODENOSCOPY (EGD) WITH PROPOFOL ;  Surgeon: Gladis RAYMOND Mariner, MD;  Location: [facility] ENDOSCOPY;  Service: Endoscopy;  Laterality: N/A;   ESOPHAGOGASTRODUODENOSCOPY (EGD) WITH PROPOFOL  N/A 07/19/2016   Procedure: ESOPHAGOGASTRODUODENOSCOPY (EGD) WITH PROPOFOL ;  Surgeon: Mariner Gladis RAYMOND, MD;  Location: [facility] END[facility]COPY;  Service: Endoscopy;  Laterality: N/A;   ESOPHAGOGASTRODUODENOSCOPY (EGD) WITH PROPOFOL  N/A 04/24/2018   Procedure: ESOPHAGOGASTRODUODENOSCOPY (EGD) WITH PROPOFOL ;  Surgeon: Mariner Gladis RAYMOND, MD;  Location: [facility] ENDOSCOPY;  Service: Endoscopy;  Laterality: N/A;   EYE SURGERY  cataracts, detached retina   hysterectomy (other)     LEFT HEART CATH AND CORONARY ANGIOGRAPHY N/A 03/24/2020   Procedure: LEFT HEART CATH AND CORONARY ANGIOGRAPHY;  Surgeon: Mady Bruckner, MD;  Location: [facility] CV [facility];  Service: Cardiovascular;  Laterality: N/A;     Family History  Problem Relation Age of Onset   Heart failure Mother    Stroke Mother    Diabetes Mother    Congestive Heart Failure Mother    Lung cancer Father    Cancer Father    Breast cancer Maternal Aunt       Social History[1]   Allergies[2]  Current Outpatient Medications  Medication Sig Dispense Refill   acetaminophen  (TYLENOL ) 500 MG tablet Take 500 mg by mouth every 6 (six) hours as needed.     albuterol  (VENTOLIN   HFA) 108 (90 Base) MCG/ACT inhaler Inhale 2 puffs into the lungs every 4 (four) hours as needed for wheezing or shortness of breath. 48 g 3   aspirin  (ASPIR-81) 81 MG EC tablet Take 81 mg by mouth daily.     calcium  carbonate (TUMS - DOSED IN MG ELEMENTAL CALCIUM ) 500 MG chewable tablet Chew 1 tablet by mouth daily as needed.     clopidogrel  (PLAVIX ) 75 MG tablet TAKE ONE TABLET BY MOUTH EVERY DAY 90 tablet 2   cyanocobalamin  1000 MCG tablet Take 1,000 mcg by mouth daily.     dapagliflozin  propanediol (FARXIGA ) 5 MG TABS tablet Take 1 tablet (5 mg total) by mouth daily before breakfast. (Patient taking differently: Take 2.5 mg by mouth every morning.) 90 tablet 3   Docusate Sodium  (DSS) 100 MG CAPS Take 1 capsule by mouth daily.     donepezil (ARICEPT) 5 MG tablet Take 5 mg by mouth at bedtime.     ezetimibe  (ZETIA ) 10 MG tablet TAKE ONE TABLET BY MOUTH EVERY DAY 90 tablet 3   felodipine  (PLENDIL ) 5 MG 24 hr tablet Take 1 tablet (5 mg total) by mouth daily. 90 tablet 3   Finerenone  (KERENDIA ) 10 MG TABS Take  1 tablet (10 mg total) by mouth daily. 90 tablet 0   Fluticasone -Umeclidin-Vilant (TRELEGY ELLIPTA ) 100-62.5-25 MCG/ACT AEPB INHALE 1 PUFF BY MOUTH EVERY DAY - DISCARD DEVICE 6 WEEKS AFTER IT IS REMOVED FROM THE FOIL TRAY OR WHEN THE DOSE COUNTER READS &quot;0&quot; (WHICHEVER COMES FIRST) 3 each 4   HYDROcodone -acetaminophen  (NORCO) 7.5-325 MG tablet Take 1 tablet by mouth every 6 (six) hours as needed for moderate pain (pain score 4-6). 120 tablet 0   insulin  glargine (LANTUS ) 100 UNIT/ML injection Inject 22 Units into the skin daily.     lisinopril  (ZESTRIL ) 20 MG tablet Take 1 tablet (20 mg total) by mouth daily. 90 tablet 4   meclizine  (ANTIVERT ) 25 MG tablet Take 25 mg by mouth 3 (three) times daily as needed.     metoprolol  tartrate (LOPRESSOR ) 100 MG tablet Take 1 tablet (100 mg total) by mouth 2 (two) times daily. 180 tablet 2   Multiple Vitamins-Minerals (DAILY MULTI) TABS Take 1  tablet by mouth daily.     nitroGLYCERIN  (NITROSTAT ) 0.4 MG SL tablet Place 1 tablet (0.4 mg total) under the tongue every 5 (five) minutes as needed. 25 tablet 3   Omega-3 Fatty Acids (FISH OIL ) 1000 MG CAPS Take 2 capsules by mouth in the morning and at bedtime.      ondansetron  (ZOFRAN  ODT) 4 MG disintegrating tablet Take 1 tablet (4 mg total) by mouth every 8 (eight) hours as needed for nausea or vomiting. 20 tablet 0   RABEprazole  (ACIPHEX ) 20 MG tablet Take 1 tablet (20 mg total) by mouth daily. 15 Mins. before evening meal 90 tablet 3   rosuvastatin  (CRESTOR ) 40 MG tablet Take 1 tablet (40 mg total) by mouth daily. 90 tablet 1   Semaglutide, 1 MG/DOSE, (OZEMPIC, 1 MG/DOSE,) 4 MG/3ML SOPN Inject 1 mg into the skin once a week.     sertraline  (ZOLOFT ) 25 MG tablet Take 25 mg by mouth daily.     sucralfate  (CARAFATE ) 1 g tablet Take 1 tablet (1 g total) by mouth 2 (two) times daily. 15 Minutes before evening meal and at bed time 180 tablet 3   ipratropium (ATROVENT ) 0.06 % nasal spray Place 2 sprays into both nostrils 4 (four) times daily. (Patient not taking: Reported on 03/29/2024) 15 mL 2   promethazine -dextromethorphan  (PROMETHAZINE -DM) 6.25-15 MG/5ML syrup Take 2.5 mLs by mouth at bedtime as needed. (Patient not taking: Reported on 03/29/2024) 118 mL 0   No current facility-administered medications for this visit.      REVIEW OF SYSTEMS (Negative unless checked)  Constitutional: [] Weight loss  [] Fever  [] Chills Cardiac: [] Chest pain   [] Chest pressure   [] Palpitations   [] Shortness of breath when laying flat   [] Shortness of breath at rest   [x] Shortness of breath with exertion. Vascular:  [] Pain in legs with walking   [] Pain in legs at rest   [] Pain in legs when laying flat   [] Claudication   [] Pain in feet when walking  [] Pain in feet at rest  [] Pain in feet when laying flat   [] History of DVT   [] Phlebitis   [] Swelling in legs   [] Varicose veins   [] Non-healing ulcers Pulmonary:    [] Uses home oxygen    [] Productive cough   [] Hemoptysis   [] Wheeze  [] COPD   [] Asthma Neurologic:  [] Dizziness  [] Blackouts   [] Seizures   [] History of stroke   [] History of TIA  [] Aphasia   [] Temporary blindness   [] Dysphagia   [] Weakness or numbness in arms   []   Weakness or numbness in legs Musculoskeletal:  [x] Arthritis   [] Joint swelling   [x] Joint pain   [] Low back pain Hematologic:  [] Easy bruising  [] Easy bleeding   [] Hypercoagulable state   [x] Anemic  [] Hepatitis Gastrointestinal:  [] Blood in stool   [] Vomiting blood  [x] Gastroesophageal reflux/heartburn   [x] Abdominal pain Genitourinary:  [] Chronic kidney disease   [] Difficult urination  [] Frequent urination  [] Burning with urination   [] Hematuria Skin:  [] Rashes   [] Ulcers   [] Wounds Psychological:  [] History of anxiety   []  History of major depression.    Physical Exam BP 139/81   Pulse 71   Resp 16   Ht 5' 5 (1.651 m)   Wt 158 lb 3.2 oz (71.8 kg)   BMI 26.33 kg/m  Gen:  WD/WN, NAD Head: [place]/AT, No temporalis wasting.  Ear/Nose/Throat: Hearing grossly intact, nares w/o erythema or drainage, oropharynx w/o Erythema/Exudate Eyes: Conjunctiva clear, sclera non-icteric  Neck: trachea midline.  No JVD.  Pulmonary:  Good air movement, respirations not labored, no use of accessory muscles  Cardiac: RRR, no JVD Vascular:  Vessel Right Left  Radial Palpable Palpable                          DP 1+ 1+  PT 1+ 1+   Gastrointestinal:. No masses, surgical incisions, or scars. Musculoskeletal: M/S 5/5 throughout.  Extremities without ischemic changes.  No deformity or atrophy.  Purplish discoloration is present in both feet and ankles.  No significant lower extremity edema. Neurologic: Sensation grossly intact in extremities.  Symmetrical.  Speech is fluent. Motor exam as listed above. Psychiatric: Judgment intact, Mood & affect appropriate for pt's clinical situation. Dermatologic: No rashes or ulcers noted.  No cellulitis or open  wounds.  Physical Exam   Radiology CUP PACEART REMOTE DEVICE CHECK Result Date: 03/26/2024 ILR summary report received. Battery status OK. Normal device function. No new symptom, tachy, brady, or pause episodes. No new AF episodes. Monthly summary reports and ROV/PRN.  1 false tachy event c/w oversensing LA CVRS  MR BRAIN WO CONTRAST Result Date: 03/17/2024 EXAM: MRI BRAIN WITHOUT CONTRAST 03/14/2024 10:33:23 AM TECHNIQUE: Multiplanar multisequence MRI of the head/brain was performed without the administration of intravenous contrast. COMPARISON: MR Head without IV contrast 11/04/2023. CLINICAL HISTORY: 79 year old female with chronic small vessel disease and advanced chronic microhemorrhages in the brain on MRI last year. FINDINGS: BRAIN AND VENTRICLES: No acute infarct. No intracranial hemorrhage. No mass. No midline shift. No hydrocephalus. The sella is unremarkable. Normal flow voids. Stable brain volume. Numerous chronic microhemorrhages clustered in the bilateral thalami, brainstem, deep cerebellar nuclei, and also scattered in the cerebral hemisphere white matter. The pattern more resembles advanced chronic small vessel disease than amyloid angiopathy, and is stable from last year. Chronic bilateral cerebral white matter T2 and FLAIR hyperintensity appear stable with superimposed areas of chronic cystic encephalomalacia in the bilateral corona radiata. Similar chronic lacunar infarcts in the bilateral deep gray matter nuclei, especially the thalami and right lentiform. Chronic left central pontine lacunar infarct. Stable gray and white matter signal. Visible internal auditory structures appear negative. ORBITS: No significant abnormality. SINUSES AND MASTOIDS: Small volume retained secretions in the nasopharynx. Visible internal auditory structures appear negative. BONES AND SOFT TISSUES: Stable visible cervical spine degeneration, largely age appropriate. Normal marrow signal. No soft tissue  abnormality. IMPRESSION: 1. Stable chronic severe small vessel disease. No acute intracranial abnormality. Electronically signed by: Helayne Hurst MD 03/17/2024 08:04 AM EST RP Workstation:  [id number]    Labs Recent Results (from the past 2160 hours)  CUP PACEART REMOTE DEVICE CHECK     Status: None   Collection Time: 01/22/24 11:33 PM  Result Value Ref Range   Date Time Interrogation Session [phone]    Pulse Generator Manufacturer MERM    Pulse Gen Model LNQ22 LINQ II    Pulse Gen Serial Number [id number]    Clinic Name [facility]    Implantable Pulse Generator Type ICM/ILR    Implantable Pulse Generator Implant Date [id number]   CUP PACEART REMOTE DEVICE CHECK     Status: None   Collection Time: 02/22/24 11:29 PM  Result Value Ref Range   Date Time Interrogation Session [phone]    Pulse Generator Manufacturer MERM    Pulse Gen Model LNQ22 LINQ II    Pulse Gen Serial Number [id number]    Clinic Name [facility]    Implantable Pulse Generator Type ICM/ILR    Implantable Pulse Generator Implant Date [id number]   CUP PACEART REMOTE DEVICE CHECK     Status: None   Collection Time: 03/24/24 11:52 PM  Result Value Ref Range   Date Time Interrogation Session [id number]    Pulse Generator Manufacturer MERM    Pulse Gen Model LNQ22 LINQ II    Pulse Gen Serial Number [id number]    Clinic Name [facility]    Implantable Pulse Generator Type ICM/ILR    Implantable Pulse Generator Implant Date [id number]     Assessment/Plan: Assessment & Plan Thoracic aortic aneurysm Thoracic aortic aneurysm at 4.7 cm with low rupture risk. Surveillance preferred due to low rupture risk and generally aortic aneurysms in this location or not repaired until they are at least 5.5 to even 6 cm in maximal diameter. - Ordered follow-up CT scan of the chest in 6 months to monitor aneurysm size and growth. - Instructed her to continue regular surveillance with CT imaging rather than  ultrasound due to thoracic location. - Advised to return for follow-up after the next CT scan for further assessment and management. We discussed the pathophysiology and natural history of aortic aneurysmal disease.  All questions were answered for she and her daughter.  Chronic venous insufficiency Chronic venous insufficiency with purplish discoloration of feet, no clinical symptoms of limb threatening arterial insufficiency - Recommended continued use of mild compression socks to improve venous return. - Suggested considering compression socks with a zipper for easier application if standard compression socks are difficult to put on. - Planned noninvasive vascular studies of the lower extremities at the next follow-up visit to establish a baseline of circulation.  Chronic diastolic congestive heart failure (HCC) Certainly worsens her lower extremity discoloration and can contribute to swelling.  Primary hypertension blood pressure control important in reducing the progression of atherosclerotic disease and aneurysmal growth. On appropriate oral medications.   Type 2 diabetes mellitus with diabetic chronic kidney disease (HCC) blood glucose control important in reducing the progression of atherosclerotic disease. Also, involved in wound healing. On appropriate medications.   Hyperlipidemia lipid control important in reducing the progression of atherosclerotic disease. Continue statin therapy   Extremity cyanosis Like related more to medical issues.  No limb threatening symptoms.  Check ABIs at next visit.     Selinda Gu 03/29/2024, 12:44 PM   This note was created with Dragon medical transcription system.  Any errors from dictation are unintentional.       [1]  Social History Tobacco Use   Smoking status: Every Day  Current packs/day: 2.00    Average packs/day: 2.0 packs/day for 57.1 years (114.2 ttl pk-yrs)    Types: Cigarettes    Start date: 1969   Smokeless tobacco:  Never  Vaping Use   Vaping status: Former  Substance Use Topics   Alcohol use: Yes    Comment: 1 glass of wine every few months on special occassions   Drug use: No  [2]  Allergies Allergen Reactions   Spironolactone  Shortness Of Breath   Sulfa Antibiotics Other (See Comments) and Rash    Mouth blisters  Welts on mouth   Sulfonamide Derivatives Other (See Comments)    Last taken as a child; made her mouth break out   Other Other (See Comments)    Mouth blisters   Codeine Nausea And Vomiting and Other (See Comments)    Nausea   Prednisone  Palpitations and Other (See Comments)    Made her legs feel weird.   "

## 2024-03-29 NOTE — Assessment & Plan Note (Signed)
 Certainly worsens her lower extremity discoloration and can contribute to swelling.

## 2024-03-29 NOTE — Assessment & Plan Note (Signed)
 lipid control important in reducing the progression of atherosclerotic disease. Continue statin therapy

## 2024-03-29 NOTE — Assessment & Plan Note (Signed)
blood pressure control important in reducing the progression of atherosclerotic disease and aneurysmal growth. On appropriate oral medications.  

## 2024-03-29 NOTE — Assessment & Plan Note (Signed)
 blood glucose control important in reducing the progression of atherosclerotic disease. Also, involved in wound healing. On appropriate medications.

## 2024-04-01 ENCOUNTER — Ambulatory Visit: Admitting: Family Medicine

## 2024-04-01 DIAGNOSIS — K219 Gastro-esophageal reflux disease without esophagitis: Secondary | ICD-10-CM

## 2024-04-01 DIAGNOSIS — J449 Chronic obstructive pulmonary disease, unspecified: Secondary | ICD-10-CM

## 2024-04-01 DIAGNOSIS — G459 Transient cerebral ischemic attack, unspecified: Secondary | ICD-10-CM

## 2024-04-01 DIAGNOSIS — I639 Cerebral infarction, unspecified: Secondary | ICD-10-CM

## 2024-04-01 DIAGNOSIS — K2270 Barrett's esophagus without dysplasia: Secondary | ICD-10-CM

## 2024-04-01 DIAGNOSIS — I2510 Atherosclerotic heart disease of native coronary artery without angina pectoris: Secondary | ICD-10-CM

## 2024-04-01 DIAGNOSIS — R270 Ataxia, unspecified: Secondary | ICD-10-CM

## 2024-04-01 DIAGNOSIS — I714 Abdominal aortic aneurysm, without rupture, unspecified: Secondary | ICD-10-CM

## 2024-04-01 DIAGNOSIS — F17210 Nicotine dependence, cigarettes, uncomplicated: Secondary | ICD-10-CM

## 2024-04-01 DIAGNOSIS — F418 Other specified anxiety disorders: Secondary | ICD-10-CM

## 2024-04-01 DIAGNOSIS — E663 Overweight: Secondary | ICD-10-CM

## 2024-04-01 DIAGNOSIS — I5032 Chronic diastolic (congestive) heart failure: Secondary | ICD-10-CM

## 2024-04-01 DIAGNOSIS — E7849 Other hyperlipidemia: Secondary | ICD-10-CM

## 2024-04-01 DIAGNOSIS — E2839 Other primary ovarian failure: Secondary | ICD-10-CM

## 2024-04-01 DIAGNOSIS — N1832 Chronic kidney disease, stage 3b: Secondary | ICD-10-CM

## 2024-04-01 DIAGNOSIS — E876 Hypokalemia: Secondary | ICD-10-CM

## 2024-04-01 DIAGNOSIS — N2581 Secondary hyperparathyroidism of renal origin: Secondary | ICD-10-CM

## 2024-04-01 DIAGNOSIS — I10 Essential (primary) hypertension: Secondary | ICD-10-CM

## 2024-04-01 DIAGNOSIS — J984 Other disorders of lung: Secondary | ICD-10-CM

## 2024-04-02 ENCOUNTER — Ambulatory Visit

## 2024-04-02 ENCOUNTER — Ambulatory Visit: Admitting: Physical Therapy

## 2024-04-02 ENCOUNTER — Ambulatory Visit: Payer: Medicare Other

## 2024-04-02 ENCOUNTER — Ambulatory Visit: Admitting: Occupational Therapy

## 2024-04-04 ENCOUNTER — Ambulatory Visit: Attending: Family Medicine | Admitting: Physical Therapy

## 2024-04-04 ENCOUNTER — Ambulatory Visit: Admitting: Occupational Therapy

## 2024-04-04 ENCOUNTER — Ambulatory Visit

## 2024-04-09 ENCOUNTER — Ambulatory Visit: Admitting: Physical Therapy

## 2024-04-09 ENCOUNTER — Ambulatory Visit: Admitting: Occupational Therapy

## 2024-04-09 ENCOUNTER — Ambulatory Visit

## 2024-04-11 ENCOUNTER — Ambulatory Visit

## 2024-04-11 ENCOUNTER — Ambulatory Visit: Admitting: Occupational Therapy

## 2024-04-11 ENCOUNTER — Ambulatory Visit: Admitting: Physical Therapy

## 2024-04-16 ENCOUNTER — Ambulatory Visit: Admitting: Physical Therapy

## 2024-04-16 ENCOUNTER — Ambulatory Visit: Admitting: Occupational Therapy

## 2024-04-16 ENCOUNTER — Ambulatory Visit

## 2024-04-18 ENCOUNTER — Ambulatory Visit: Admitting: Physical Therapy

## 2024-04-18 ENCOUNTER — Ambulatory Visit: Admitting: Occupational Therapy

## 2024-04-18 ENCOUNTER — Ambulatory Visit

## 2024-04-23 ENCOUNTER — Ambulatory Visit: Admitting: Occupational Therapy

## 2024-04-23 ENCOUNTER — Ambulatory Visit: Attending: Family Medicine | Admitting: Physical Therapy

## 2024-04-23 ENCOUNTER — Ambulatory Visit

## 2024-04-25 ENCOUNTER — Ambulatory Visit

## 2024-04-25 ENCOUNTER — Ambulatory Visit: Admitting: Physical Therapy

## 2024-04-25 ENCOUNTER — Ambulatory Visit: Admitting: Occupational Therapy

## 2024-04-30 ENCOUNTER — Ambulatory Visit

## 2024-04-30 ENCOUNTER — Ambulatory Visit: Admitting: Physical Therapy

## 2024-04-30 ENCOUNTER — Ambulatory Visit: Admitting: Occupational Therapy

## 2024-05-02 ENCOUNTER — Ambulatory Visit: Admitting: Occupational Therapy

## 2024-05-02 ENCOUNTER — Ambulatory Visit

## 2024-05-02 ENCOUNTER — Ambulatory Visit: Admitting: Physical Therapy

## 2024-05-07 ENCOUNTER — Ambulatory Visit

## 2024-05-07 ENCOUNTER — Ambulatory Visit: Admitting: Occupational Therapy

## 2024-05-09 ENCOUNTER — Ambulatory Visit: Admitting: Occupational Therapy

## 2024-05-09 ENCOUNTER — Ambulatory Visit

## 2024-05-14 ENCOUNTER — Ambulatory Visit

## 2024-05-14 ENCOUNTER — Ambulatory Visit: Admitting: Physical Therapy

## 2024-05-16 ENCOUNTER — Ambulatory Visit

## 2024-05-16 ENCOUNTER — Ambulatory Visit: Admitting: Physical Therapy

## 2024-05-16 ENCOUNTER — Ambulatory Visit: Admitting: Occupational Therapy

## 2024-05-21 ENCOUNTER — Ambulatory Visit: Admitting: Occupational Therapy

## 2024-05-21 ENCOUNTER — Ambulatory Visit

## 2024-05-23 ENCOUNTER — Ambulatory Visit: Admitting: Occupational Therapy

## 2024-05-23 ENCOUNTER — Ambulatory Visit: Admitting: Physical Therapy

## 2024-05-23 ENCOUNTER — Ambulatory Visit

## 2024-05-26 ENCOUNTER — Ambulatory Visit

## 2024-05-28 ENCOUNTER — Ambulatory Visit: Admitting: Occupational Therapy

## 2024-05-28 ENCOUNTER — Ambulatory Visit

## 2024-05-28 ENCOUNTER — Ambulatory Visit: Admitting: Physical Therapy

## 2024-05-30 ENCOUNTER — Ambulatory Visit

## 2024-05-30 ENCOUNTER — Ambulatory Visit: Admitting: Occupational Therapy

## 2024-05-30 ENCOUNTER — Ambulatory Visit: Admitting: Physical Therapy

## 2024-06-04 ENCOUNTER — Ambulatory Visit: Admitting: Occupational Therapy

## 2024-06-04 ENCOUNTER — Ambulatory Visit

## 2024-06-04 ENCOUNTER — Ambulatory Visit: Admitting: Physical Therapy

## 2024-06-06 ENCOUNTER — Ambulatory Visit: Admitting: Occupational Therapy

## 2024-06-06 ENCOUNTER — Ambulatory Visit: Admitting: Physical Therapy

## 2024-06-06 ENCOUNTER — Ambulatory Visit

## 2024-06-11 ENCOUNTER — Ambulatory Visit: Admitting: Physical Therapy

## 2024-06-11 ENCOUNTER — Ambulatory Visit

## 2024-06-11 ENCOUNTER — Ambulatory Visit: Admitting: Occupational Therapy

## 2024-06-13 ENCOUNTER — Ambulatory Visit: Admitting: Physical Therapy

## 2024-06-13 ENCOUNTER — Ambulatory Visit: Admitting: Occupational Therapy

## 2024-06-13 ENCOUNTER — Ambulatory Visit

## 2024-06-18 ENCOUNTER — Ambulatory Visit

## 2024-06-18 ENCOUNTER — Ambulatory Visit: Admitting: Occupational Therapy

## 2024-06-18 ENCOUNTER — Ambulatory Visit: Admitting: Physical Therapy

## 2024-06-20 ENCOUNTER — Ambulatory Visit: Admitting: Physical Therapy

## 2024-06-20 ENCOUNTER — Ambulatory Visit: Admitting: Occupational Therapy

## 2024-06-20 ENCOUNTER — Ambulatory Visit

## 2024-06-25 ENCOUNTER — Ambulatory Visit: Admitting: Physical Therapy

## 2024-06-25 ENCOUNTER — Ambulatory Visit

## 2024-06-25 ENCOUNTER — Ambulatory Visit: Admitting: Occupational Therapy

## 2024-06-26 ENCOUNTER — Ambulatory Visit

## 2024-06-27 ENCOUNTER — Ambulatory Visit

## 2024-06-27 ENCOUNTER — Ambulatory Visit: Admitting: Occupational Therapy

## 2024-06-27 ENCOUNTER — Ambulatory Visit: Admitting: Physical Therapy

## 2024-07-02 ENCOUNTER — Ambulatory Visit

## 2024-07-02 ENCOUNTER — Ambulatory Visit: Admitting: Physical Therapy

## 2024-07-02 ENCOUNTER — Ambulatory Visit: Admitting: Occupational Therapy

## 2024-07-04 ENCOUNTER — Ambulatory Visit: Admitting: Occupational Therapy

## 2024-07-04 ENCOUNTER — Ambulatory Visit

## 2024-07-04 ENCOUNTER — Ambulatory Visit: Admitting: Physical Therapy

## 2024-07-09 ENCOUNTER — Ambulatory Visit

## 2024-07-09 ENCOUNTER — Ambulatory Visit: Admitting: Physical Therapy

## 2024-07-09 ENCOUNTER — Ambulatory Visit: Admitting: Occupational Therapy

## 2024-07-11 ENCOUNTER — Ambulatory Visit: Admitting: Occupational Therapy

## 2024-07-11 ENCOUNTER — Ambulatory Visit

## 2024-07-11 ENCOUNTER — Ambulatory Visit: Admitting: Physical Therapy

## 2024-07-27 ENCOUNTER — Ambulatory Visit

## 2024-08-27 ENCOUNTER — Ambulatory Visit

## 2024-09-24 ENCOUNTER — Ambulatory Visit (INDEPENDENT_AMBULATORY_CARE_PROVIDER_SITE_OTHER): Admitting: Vascular Surgery

## 2024-09-24 ENCOUNTER — Encounter (INDEPENDENT_AMBULATORY_CARE_PROVIDER_SITE_OTHER)

## 2024-09-27 ENCOUNTER — Ambulatory Visit

## 2024-10-28 ENCOUNTER — Ambulatory Visit

## 2024-11-28 ENCOUNTER — Ambulatory Visit

## 2024-12-29 ENCOUNTER — Ambulatory Visit

## 2025-01-29 ENCOUNTER — Ambulatory Visit

## 2025-03-01 ENCOUNTER — Ambulatory Visit
